# Patient Record
Sex: Female | Born: 1955 | Race: Black or African American | Hispanic: No | Smoking: Never smoker
Health system: Southern US, Community
[De-identification: ages and names within clinical notes are randomized; demographics above are authoritative.]

## PROBLEM LIST (undated history)

## (undated) ENCOUNTER — Emergency Department (HOSPITAL_COMMUNITY): Disposition: A | Discharge status: Home / Self Care | Payer: Medicare Other

## (undated) DIAGNOSIS — Z9981 Dependence on supplemental oxygen: Secondary | ICD-10-CM

## (undated) DIAGNOSIS — R0602 Shortness of breath: Secondary | ICD-10-CM

## (undated) DIAGNOSIS — F32A Depression, unspecified: Secondary | ICD-10-CM

## (undated) DIAGNOSIS — J449 Chronic obstructive pulmonary disease, unspecified: Secondary | ICD-10-CM

## (undated) DIAGNOSIS — E785 Hyperlipidemia, unspecified: Secondary | ICD-10-CM

## (undated) DIAGNOSIS — R569 Unspecified convulsions: Secondary | ICD-10-CM

## (undated) DIAGNOSIS — D689 Coagulation defect, unspecified: Secondary | ICD-10-CM

## (undated) DIAGNOSIS — R42 Dizziness and giddiness: Secondary | ICD-10-CM

## (undated) DIAGNOSIS — M1990 Unspecified osteoarthritis, unspecified site: Secondary | ICD-10-CM

## (undated) DIAGNOSIS — F329 Major depressive disorder, single episode, unspecified: Secondary | ICD-10-CM

## (undated) DIAGNOSIS — K219 Gastro-esophageal reflux disease without esophagitis: Secondary | ICD-10-CM

## (undated) DIAGNOSIS — Z87442 Personal history of urinary calculi: Secondary | ICD-10-CM

## (undated) DIAGNOSIS — G4730 Sleep apnea, unspecified: Secondary | ICD-10-CM

## (undated) DIAGNOSIS — E039 Hypothyroidism, unspecified: Secondary | ICD-10-CM

## (undated) DIAGNOSIS — Z9289 Personal history of other medical treatment: Secondary | ICD-10-CM

## (undated) DIAGNOSIS — M797 Fibromyalgia: Secondary | ICD-10-CM

## (undated) DIAGNOSIS — J45909 Unspecified asthma, uncomplicated: Secondary | ICD-10-CM

## (undated) DIAGNOSIS — I82409 Acute embolism and thrombosis of unspecified deep veins of unspecified lower extremity: Secondary | ICD-10-CM

## (undated) DIAGNOSIS — F1411 Cocaine abuse, in remission: Secondary | ICD-10-CM

## (undated) DIAGNOSIS — E278 Other specified disorders of adrenal gland: Secondary | ICD-10-CM

## (undated) DIAGNOSIS — R011 Cardiac murmur, unspecified: Secondary | ICD-10-CM

## (undated) DIAGNOSIS — I10 Essential (primary) hypertension: Secondary | ICD-10-CM

## (undated) HISTORY — DX: Chronic obstructive pulmonary disease, unspecified: J44.9

## (undated) HISTORY — DX: Unspecified osteoarthritis, unspecified site: M19.90

## (undated) HISTORY — PX: BACK SURGERY: SHX140

## (undated) HISTORY — DX: Cocaine abuse, in remission: F14.11

## (undated) HISTORY — DX: Essential (primary) hypertension: I10

## (undated) HISTORY — DX: Other specified disorders of adrenal gland: E27.8

## (undated) HISTORY — DX: Acute embolism and thrombosis of unspecified deep veins of unspecified lower extremity: I82.409

## (undated) HISTORY — DX: Hyperlipidemia, unspecified: E78.5

## (undated) HISTORY — DX: Coagulation defect, unspecified: D68.9

## (undated) HISTORY — PX: OTHER SURGICAL HISTORY: SHX169

## (undated) HISTORY — PX: CARPAL TUNNEL RELEASE: SHX101

## (undated) HISTORY — PX: CHOLECYSTECTOMY: SHX55

## (undated) HISTORY — PX: TUBAL LIGATION: SHX77

---

## 1987-03-21 HISTORY — PX: ABDOMINAL HYSTERECTOMY: SHX81

## 1997-07-04 ENCOUNTER — Ambulatory Visit (HOSPITAL_COMMUNITY): Admission: RE | Admit: 1997-07-04 | Discharge: 1997-07-04 | Payer: Self-pay | Admitting: Emergency Medicine

## 1997-08-22 ENCOUNTER — Emergency Department (HOSPITAL_COMMUNITY): Admission: EM | Admit: 1997-08-22 | Discharge: 1997-08-22 | Payer: Self-pay | Admitting: Emergency Medicine

## 1997-09-06 ENCOUNTER — Ambulatory Visit (HOSPITAL_COMMUNITY): Admission: RE | Admit: 1997-09-06 | Discharge: 1997-09-06 | Payer: Self-pay | Admitting: Neurosurgery

## 1997-09-11 ENCOUNTER — Inpatient Hospital Stay (HOSPITAL_COMMUNITY): Admission: RE | Admit: 1997-09-11 | Discharge: 1997-09-14 | Payer: Self-pay | Admitting: Neurosurgery

## 1997-09-25 ENCOUNTER — Ambulatory Visit (HOSPITAL_COMMUNITY): Admission: RE | Admit: 1997-09-25 | Discharge: 1997-09-25 | Payer: Self-pay | Admitting: Neurosurgery

## 1997-09-29 ENCOUNTER — Encounter: Admission: RE | Admit: 1997-09-29 | Discharge: 1997-12-28 | Payer: Self-pay | Admitting: Neurosurgery

## 1997-10-01 ENCOUNTER — Ambulatory Visit (HOSPITAL_COMMUNITY): Admission: RE | Admit: 1997-10-01 | Discharge: 1997-10-01 | Payer: Self-pay | Admitting: Neurosurgery

## 1998-04-15 ENCOUNTER — Ambulatory Visit (HOSPITAL_COMMUNITY): Admission: RE | Admit: 1998-04-15 | Discharge: 1998-04-15 | Payer: Self-pay | Admitting: Neurosurgery

## 1998-04-15 ENCOUNTER — Encounter: Payer: Self-pay | Admitting: Neurosurgery

## 1998-05-05 ENCOUNTER — Emergency Department (HOSPITAL_COMMUNITY): Admission: EM | Admit: 1998-05-05 | Discharge: 1998-05-05 | Payer: Self-pay | Admitting: Emergency Medicine

## 1998-06-30 ENCOUNTER — Emergency Department (HOSPITAL_COMMUNITY): Admission: EM | Admit: 1998-06-30 | Discharge: 1998-06-30 | Payer: Self-pay | Admitting: Emergency Medicine

## 1998-09-22 ENCOUNTER — Emergency Department (HOSPITAL_COMMUNITY): Admission: EM | Admit: 1998-09-22 | Discharge: 1998-09-22 | Payer: Self-pay | Admitting: Emergency Medicine

## 1998-11-10 ENCOUNTER — Emergency Department (HOSPITAL_COMMUNITY): Admission: EM | Admit: 1998-11-10 | Discharge: 1998-11-10 | Payer: Self-pay | Admitting: Emergency Medicine

## 1998-11-10 ENCOUNTER — Encounter: Payer: Self-pay | Admitting: Emergency Medicine

## 1999-02-03 ENCOUNTER — Emergency Department (HOSPITAL_COMMUNITY): Admission: EM | Admit: 1999-02-03 | Discharge: 1999-02-03 | Payer: Self-pay

## 1999-02-14 ENCOUNTER — Other Ambulatory Visit: Admission: RE | Admit: 1999-02-14 | Discharge: 1999-02-14 | Payer: Self-pay | Admitting: Orthopedic Surgery

## 1999-05-02 ENCOUNTER — Emergency Department (HOSPITAL_COMMUNITY): Admission: EM | Admit: 1999-05-02 | Discharge: 1999-05-02 | Payer: Self-pay

## 1999-05-25 ENCOUNTER — Emergency Department (HOSPITAL_COMMUNITY): Admission: EM | Admit: 1999-05-25 | Discharge: 1999-05-25 | Payer: Self-pay | Admitting: *Deleted

## 1999-11-04 ENCOUNTER — Emergency Department (HOSPITAL_COMMUNITY): Admission: EM | Admit: 1999-11-04 | Discharge: 1999-11-04 | Payer: Self-pay | Admitting: *Deleted

## 1999-11-05 ENCOUNTER — Emergency Department (HOSPITAL_COMMUNITY): Admission: EM | Admit: 1999-11-05 | Discharge: 1999-11-05 | Payer: Self-pay | Admitting: Emergency Medicine

## 2000-01-17 ENCOUNTER — Emergency Department (HOSPITAL_COMMUNITY): Admission: EM | Admit: 2000-01-17 | Discharge: 2000-01-17 | Payer: Self-pay | Admitting: Emergency Medicine

## 2000-01-18 ENCOUNTER — Emergency Department (HOSPITAL_COMMUNITY): Admission: EM | Admit: 2000-01-18 | Discharge: 2000-01-18 | Payer: Self-pay | Admitting: Emergency Medicine

## 2000-04-02 ENCOUNTER — Emergency Department (HOSPITAL_COMMUNITY): Admission: EM | Admit: 2000-04-02 | Discharge: 2000-04-02 | Payer: Self-pay | Admitting: Emergency Medicine

## 2000-07-08 ENCOUNTER — Emergency Department (HOSPITAL_COMMUNITY): Admission: EM | Admit: 2000-07-08 | Discharge: 2000-07-08 | Payer: Self-pay | Admitting: Internal Medicine

## 2000-08-12 ENCOUNTER — Emergency Department (HOSPITAL_COMMUNITY): Admission: EM | Admit: 2000-08-12 | Discharge: 2000-08-12 | Payer: Self-pay | Admitting: Emergency Medicine

## 2000-08-15 ENCOUNTER — Inpatient Hospital Stay (HOSPITAL_COMMUNITY): Admission: EM | Admit: 2000-08-15 | Discharge: 2000-08-16 | Payer: Self-pay | Admitting: Emergency Medicine

## 2000-08-15 ENCOUNTER — Encounter: Payer: Self-pay | Admitting: Emergency Medicine

## 2000-08-16 ENCOUNTER — Encounter: Payer: Self-pay | Admitting: Internal Medicine

## 2000-08-23 ENCOUNTER — Emergency Department (HOSPITAL_COMMUNITY): Admission: EM | Admit: 2000-08-23 | Discharge: 2000-08-23 | Payer: Self-pay | Admitting: Emergency Medicine

## 2000-08-23 ENCOUNTER — Encounter: Payer: Self-pay | Admitting: Emergency Medicine

## 2000-10-01 ENCOUNTER — Inpatient Hospital Stay (HOSPITAL_COMMUNITY): Admission: EM | Admit: 2000-10-01 | Discharge: 2000-10-03 | Payer: Self-pay | Admitting: Emergency Medicine

## 2000-10-02 ENCOUNTER — Encounter: Payer: Self-pay | Admitting: Endocrinology

## 2000-10-18 ENCOUNTER — Encounter: Payer: Self-pay | Admitting: Emergency Medicine

## 2000-10-18 ENCOUNTER — Emergency Department (HOSPITAL_COMMUNITY): Admission: EM | Admit: 2000-10-18 | Discharge: 2000-10-18 | Payer: Self-pay | Admitting: Emergency Medicine

## 2000-10-26 ENCOUNTER — Encounter: Payer: Self-pay | Admitting: Emergency Medicine

## 2000-10-26 ENCOUNTER — Emergency Department (HOSPITAL_COMMUNITY): Admission: EM | Admit: 2000-10-26 | Discharge: 2000-10-26 | Payer: Self-pay | Admitting: Emergency Medicine

## 2001-03-16 ENCOUNTER — Emergency Department (HOSPITAL_COMMUNITY): Admission: EM | Admit: 2001-03-16 | Discharge: 2001-03-16 | Payer: Self-pay

## 2004-07-02 ENCOUNTER — Emergency Department (HOSPITAL_COMMUNITY): Admission: EM | Admit: 2004-07-02 | Discharge: 2004-07-02 | Payer: Self-pay | Admitting: Emergency Medicine

## 2005-02-11 ENCOUNTER — Emergency Department (HOSPITAL_COMMUNITY): Admission: EM | Admit: 2005-02-11 | Discharge: 2005-02-12 | Payer: Self-pay | Admitting: Emergency Medicine

## 2005-02-14 ENCOUNTER — Emergency Department (HOSPITAL_COMMUNITY): Admission: EM | Admit: 2005-02-14 | Discharge: 2005-02-14 | Payer: Self-pay | Admitting: Emergency Medicine

## 2005-02-22 ENCOUNTER — Ambulatory Visit: Payer: Self-pay | Admitting: Internal Medicine

## 2005-02-24 ENCOUNTER — Ambulatory Visit: Payer: Self-pay | Admitting: Internal Medicine

## 2005-03-17 ENCOUNTER — Ambulatory Visit (HOSPITAL_COMMUNITY): Admission: RE | Admit: 2005-03-17 | Discharge: 2005-03-17 | Payer: Self-pay | Admitting: Internal Medicine

## 2005-03-21 ENCOUNTER — Encounter (INDEPENDENT_AMBULATORY_CARE_PROVIDER_SITE_OTHER): Payer: Self-pay | Admitting: Specialist

## 2005-03-21 ENCOUNTER — Ambulatory Visit (HOSPITAL_COMMUNITY): Admission: RE | Admit: 2005-03-21 | Discharge: 2005-03-21 | Payer: Self-pay | Admitting: Internal Medicine

## 2005-03-21 ENCOUNTER — Emergency Department (HOSPITAL_COMMUNITY): Admission: EM | Admit: 2005-03-21 | Discharge: 2005-03-22 | Payer: Self-pay | Admitting: Emergency Medicine

## 2005-03-31 ENCOUNTER — Ambulatory Visit: Payer: Self-pay | Admitting: Hospitalist

## 2005-09-12 ENCOUNTER — Emergency Department (HOSPITAL_COMMUNITY): Admission: EM | Admit: 2005-09-12 | Discharge: 2005-09-12 | Payer: Self-pay | Admitting: Emergency Medicine

## 2005-10-20 ENCOUNTER — Emergency Department (HOSPITAL_COMMUNITY): Admission: EM | Admit: 2005-10-20 | Discharge: 2005-10-20 | Payer: Self-pay | Admitting: Emergency Medicine

## 2005-10-21 ENCOUNTER — Emergency Department (HOSPITAL_COMMUNITY): Admission: EM | Admit: 2005-10-21 | Discharge: 2005-10-21 | Payer: Self-pay | Admitting: Emergency Medicine

## 2005-10-24 ENCOUNTER — Ambulatory Visit: Payer: Self-pay | Admitting: Cardiology

## 2005-11-27 ENCOUNTER — Ambulatory Visit (HOSPITAL_COMMUNITY): Admission: RE | Admit: 2005-11-27 | Discharge: 2005-11-27 | Payer: Self-pay | Admitting: Cardiology

## 2005-11-29 ENCOUNTER — Emergency Department (HOSPITAL_COMMUNITY): Admission: EM | Admit: 2005-11-29 | Discharge: 2005-11-29 | Payer: Self-pay | Admitting: Emergency Medicine

## 2005-12-27 ENCOUNTER — Ambulatory Visit: Payer: Self-pay | Admitting: Cardiology

## 2005-12-28 ENCOUNTER — Emergency Department (HOSPITAL_COMMUNITY): Admission: EM | Admit: 2005-12-28 | Discharge: 2005-12-28 | Payer: Self-pay | Admitting: Emergency Medicine

## 2006-01-02 ENCOUNTER — Inpatient Hospital Stay (HOSPITAL_COMMUNITY): Admission: EM | Admit: 2006-01-02 | Discharge: 2006-01-05 | Payer: Self-pay | Admitting: Emergency Medicine

## 2006-01-02 ENCOUNTER — Ambulatory Visit: Payer: Self-pay | Admitting: Cardiology

## 2006-01-02 ENCOUNTER — Encounter: Payer: Self-pay | Admitting: Cardiology

## 2006-01-08 ENCOUNTER — Ambulatory Visit: Payer: Self-pay | Admitting: Family Medicine

## 2006-01-09 ENCOUNTER — Ambulatory Visit (HOSPITAL_COMMUNITY): Admission: RE | Admit: 2006-01-09 | Discharge: 2006-01-09 | Payer: Self-pay | Admitting: *Deleted

## 2006-01-09 ENCOUNTER — Encounter (INDEPENDENT_AMBULATORY_CARE_PROVIDER_SITE_OTHER): Payer: Self-pay | Admitting: *Deleted

## 2006-01-09 ENCOUNTER — Ambulatory Visit: Payer: Self-pay | Admitting: *Deleted

## 2006-01-29 ENCOUNTER — Ambulatory Visit: Payer: Self-pay | Admitting: Nurse Practitioner

## 2006-03-08 ENCOUNTER — Ambulatory Visit: Payer: Self-pay | Admitting: Family Medicine

## 2006-03-09 ENCOUNTER — Emergency Department (HOSPITAL_COMMUNITY): Admission: EM | Admit: 2006-03-09 | Discharge: 2006-03-09 | Payer: Self-pay | Admitting: Emergency Medicine

## 2006-03-14 ENCOUNTER — Ambulatory Visit: Payer: Self-pay | Admitting: Family Medicine

## 2006-04-03 ENCOUNTER — Ambulatory Visit: Payer: Self-pay | Admitting: Family Medicine

## 2006-04-12 ENCOUNTER — Ambulatory Visit: Payer: Self-pay | Admitting: Family Medicine

## 2006-05-01 ENCOUNTER — Ambulatory Visit: Payer: Self-pay | Admitting: Internal Medicine

## 2006-05-01 ENCOUNTER — Ambulatory Visit: Payer: Self-pay | Admitting: *Deleted

## 2006-05-01 ENCOUNTER — Inpatient Hospital Stay (HOSPITAL_COMMUNITY): Admission: EM | Admit: 2006-05-01 | Discharge: 2006-05-03 | Payer: Self-pay | Admitting: Emergency Medicine

## 2006-05-01 ENCOUNTER — Encounter (INDEPENDENT_AMBULATORY_CARE_PROVIDER_SITE_OTHER): Payer: Self-pay | Admitting: Cardiology

## 2006-05-04 ENCOUNTER — Ambulatory Visit: Payer: Self-pay | Admitting: *Deleted

## 2006-05-04 ENCOUNTER — Emergency Department (HOSPITAL_COMMUNITY): Admission: EM | Admit: 2006-05-04 | Discharge: 2006-05-05 | Payer: Self-pay | Admitting: Emergency Medicine

## 2006-05-07 ENCOUNTER — Ambulatory Visit: Payer: Self-pay | Admitting: Family Medicine

## 2006-05-08 ENCOUNTER — Ambulatory Visit: Payer: Self-pay | Admitting: Internal Medicine

## 2006-05-09 ENCOUNTER — Ambulatory Visit: Payer: Self-pay | Admitting: Family Medicine

## 2006-05-14 ENCOUNTER — Ambulatory Visit: Payer: Self-pay | Admitting: Family Medicine

## 2006-05-17 ENCOUNTER — Ambulatory Visit: Payer: Self-pay | Admitting: Family Medicine

## 2006-05-24 ENCOUNTER — Ambulatory Visit: Payer: Self-pay | Admitting: Family Medicine

## 2006-06-04 ENCOUNTER — Ambulatory Visit: Payer: Self-pay | Admitting: Family Medicine

## 2006-06-05 ENCOUNTER — Ambulatory Visit: Payer: Self-pay | Admitting: Family Medicine

## 2006-06-10 ENCOUNTER — Emergency Department (HOSPITAL_COMMUNITY): Admission: EM | Admit: 2006-06-10 | Discharge: 2006-06-10 | Payer: Self-pay | Admitting: *Deleted

## 2006-06-12 ENCOUNTER — Ambulatory Visit: Payer: Self-pay | Admitting: Family Medicine

## 2006-06-13 ENCOUNTER — Inpatient Hospital Stay (HOSPITAL_COMMUNITY): Admission: EM | Admit: 2006-06-13 | Discharge: 2006-06-15 | Payer: Self-pay | Admitting: Emergency Medicine

## 2006-06-26 ENCOUNTER — Ambulatory Visit: Payer: Self-pay | Admitting: Family Medicine

## 2006-07-02 ENCOUNTER — Ambulatory Visit: Payer: Self-pay | Admitting: Family Medicine

## 2006-07-03 ENCOUNTER — Ambulatory Visit: Payer: Self-pay | Admitting: Family Medicine

## 2006-07-11 ENCOUNTER — Ambulatory Visit: Payer: Self-pay | Admitting: Family Medicine

## 2006-07-18 ENCOUNTER — Ambulatory Visit: Payer: Self-pay | Admitting: Family Medicine

## 2006-07-25 ENCOUNTER — Ambulatory Visit: Payer: Self-pay | Admitting: Family Medicine

## 2006-07-30 ENCOUNTER — Emergency Department (HOSPITAL_COMMUNITY): Admission: EM | Admit: 2006-07-30 | Discharge: 2006-07-30 | Payer: Self-pay | Admitting: Emergency Medicine

## 2006-08-06 ENCOUNTER — Ambulatory Visit: Payer: Self-pay | Admitting: Family Medicine

## 2006-08-16 ENCOUNTER — Ambulatory Visit: Payer: Self-pay | Admitting: Family Medicine

## 2006-09-18 ENCOUNTER — Ambulatory Visit: Payer: Self-pay | Admitting: Internal Medicine

## 2006-09-27 ENCOUNTER — Emergency Department (HOSPITAL_COMMUNITY): Admission: EM | Admit: 2006-09-27 | Discharge: 2006-09-27 | Payer: Self-pay | Admitting: Emergency Medicine

## 2006-10-18 ENCOUNTER — Ambulatory Visit: Payer: Self-pay | Admitting: Internal Medicine

## 2006-10-19 ENCOUNTER — Ambulatory Visit (HOSPITAL_COMMUNITY): Admission: RE | Admit: 2006-10-19 | Discharge: 2006-10-19 | Payer: Self-pay | Admitting: Internal Medicine

## 2006-10-30 ENCOUNTER — Ambulatory Visit: Payer: Self-pay | Admitting: Internal Medicine

## 2006-12-04 ENCOUNTER — Ambulatory Visit: Payer: Self-pay | Admitting: Internal Medicine

## 2006-12-21 ENCOUNTER — Encounter: Admission: RE | Admit: 2006-12-21 | Discharge: 2007-02-12 | Payer: Self-pay | Admitting: Family Medicine

## 2006-12-24 IMAGING — CT CT ABDOMEN W/O CM
1 of 2 series · 15 of 32 positions shown, 19 images · non-contrast
Comparison: none

CLINICAL DATA: Left-sided abdominal pain

ABDOMEN CT WITHOUT CONTRAST - URINARY STONE PROTOCOL
TECHNIQUE: Multidetector CT imaging of the abdomen was performed following the
urinary stone protocol.  No oral or intravenous contrast was administered.
TECHNIQUE: Multidetector CT imaging of the pelvis was performed following the

[Series 2: routine abdomen · axial · 0.83mm/px · z∈[-448,-44]mm · 15 of 89 slices shown, 19 images]
[im 4/89  soft-tissue]
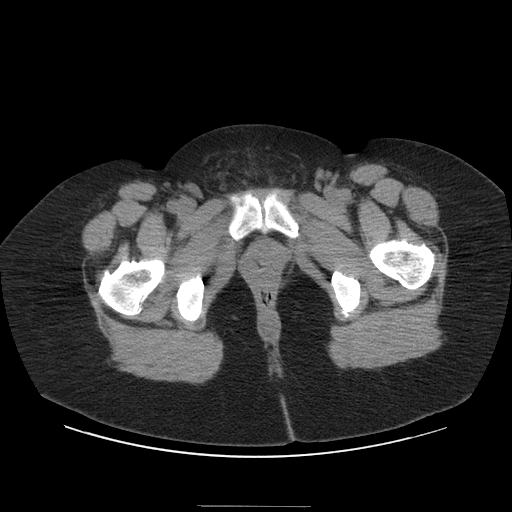
[im 4/89  bone]
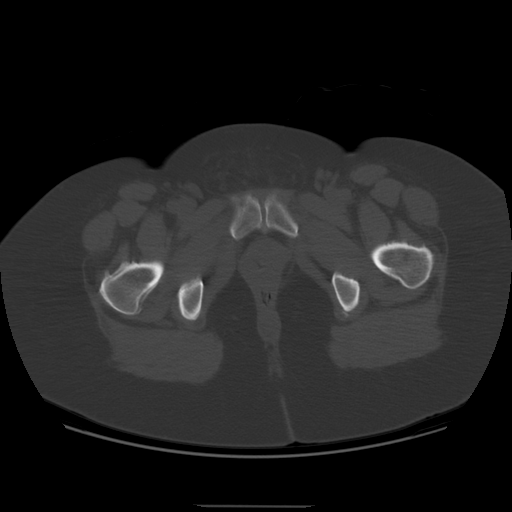
[im 12/89  soft-tissue]
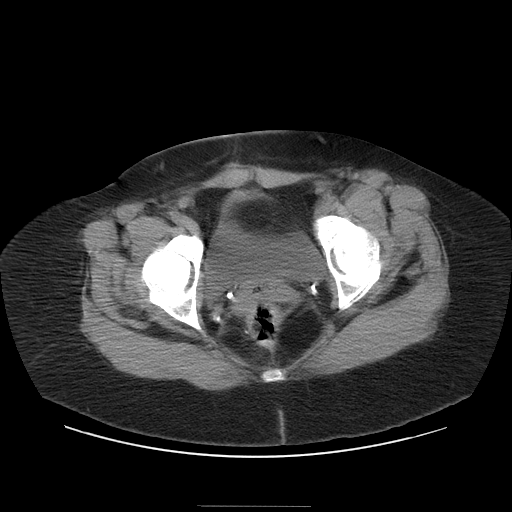
[im 19/89  soft-tissue]
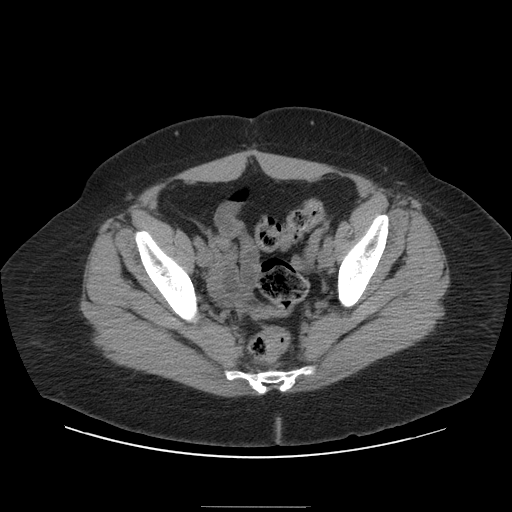
[im 26/89  soft-tissue]
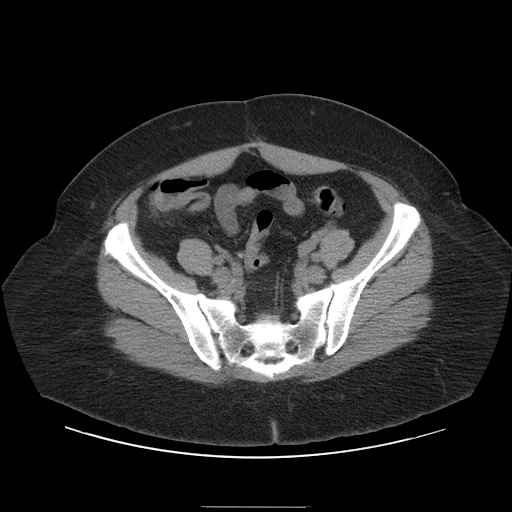
[im 30/89  soft-tissue]
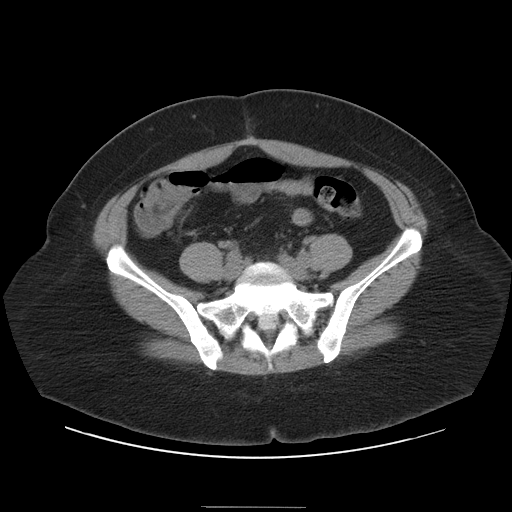
[im 37/89  soft-tissue]
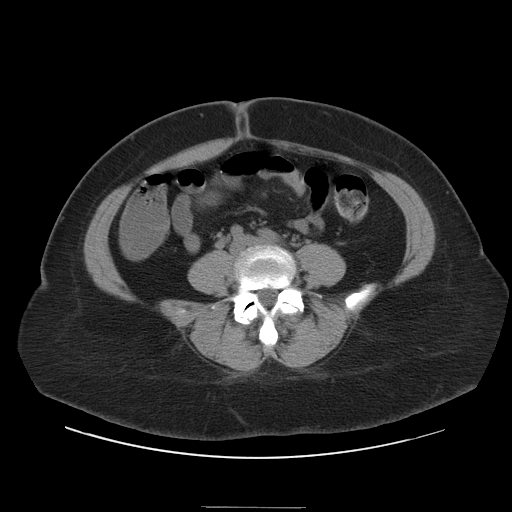
[im 45/89  soft-tissue]
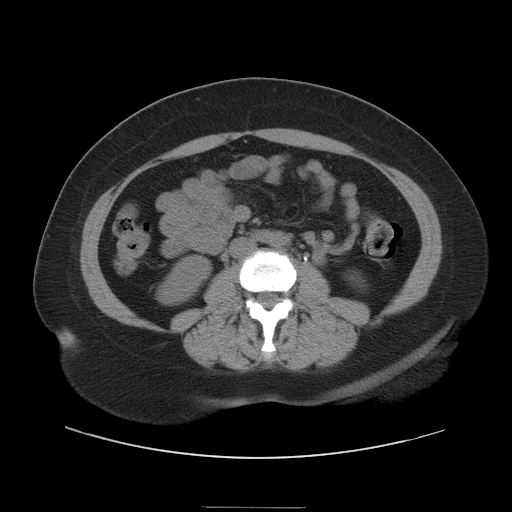
[im 52/89  soft-tissue]
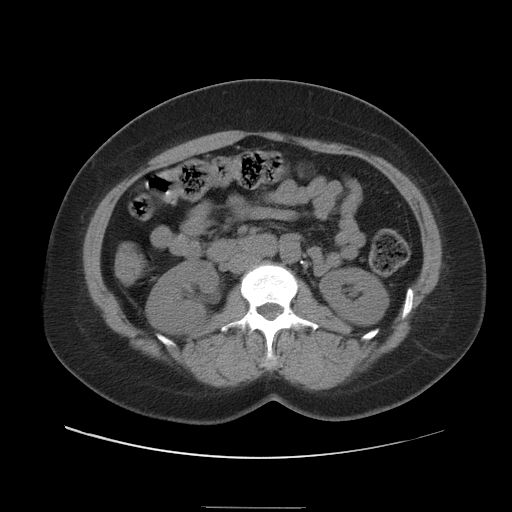
[im 59/89  soft-tissue]
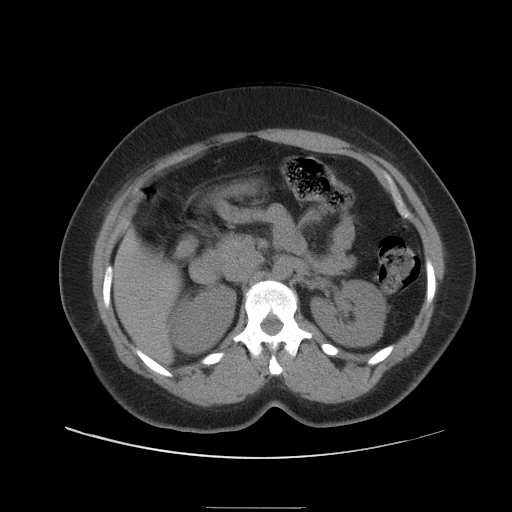
[im 59/89  bone]
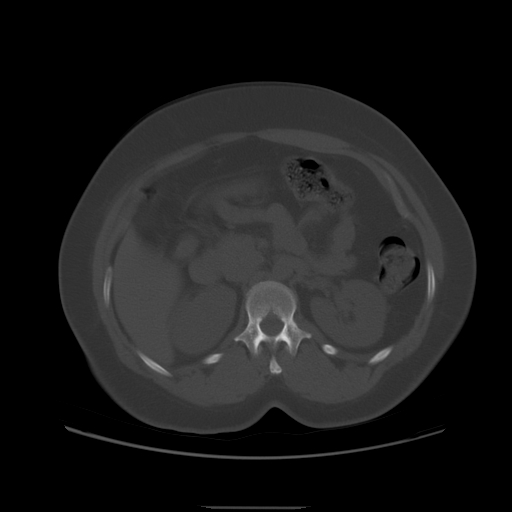
[im 63/89  soft-tissue]
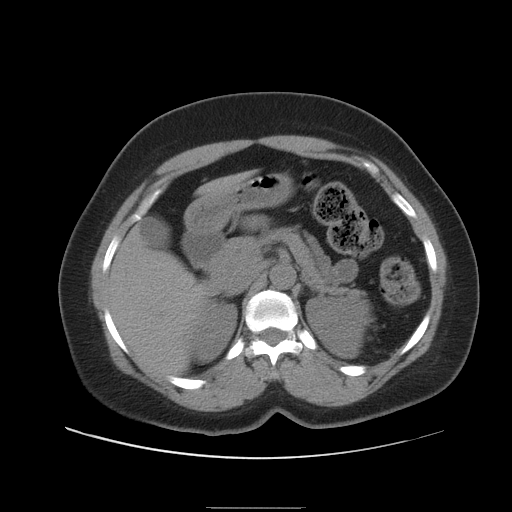
[im 70/89  soft-tissue]
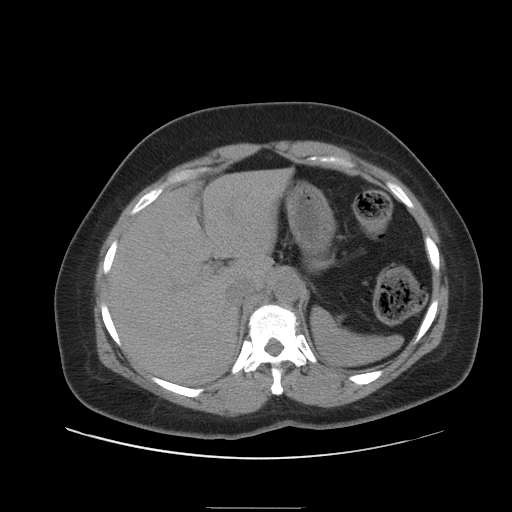
[im 74/89  lung]
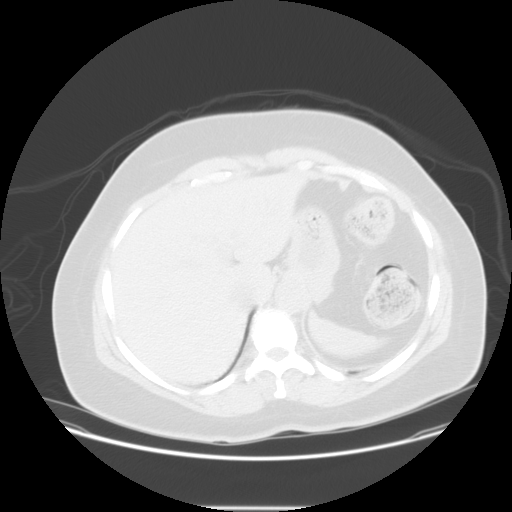
[im 78/89  soft-tissue]
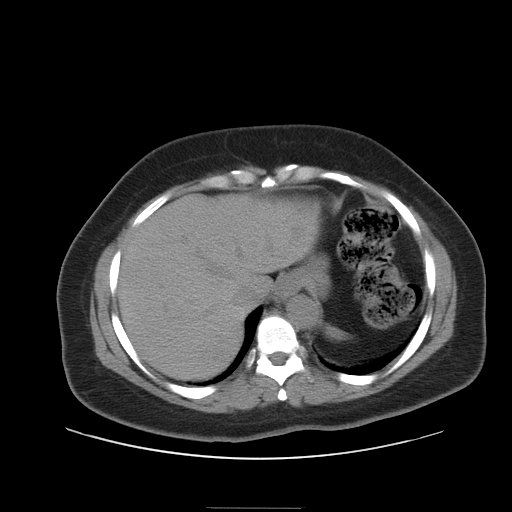
[im 78/89  lung]
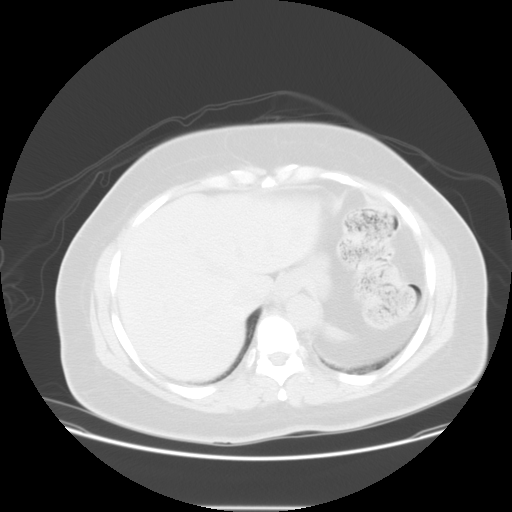
[im 81/89  lung]
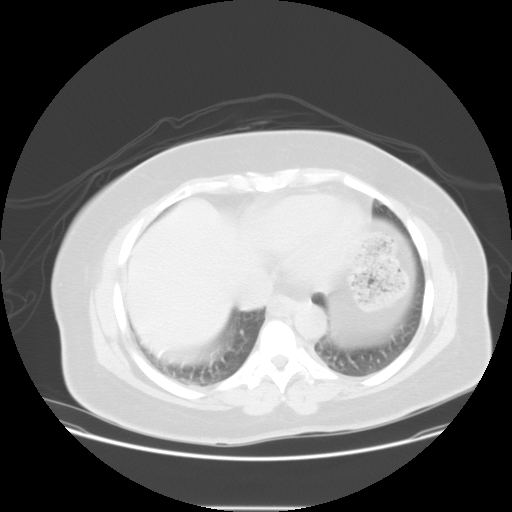
[im 85/89  soft-tissue]
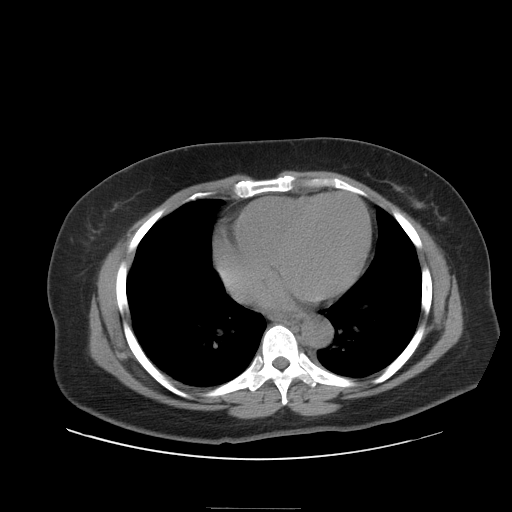
[im 85/89  lung]
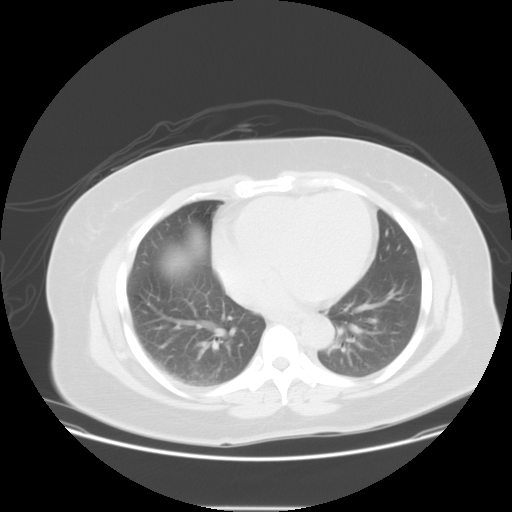

[15 of 32 positions shown; findings below may reference images not displayed]

FINDINGS: There is no evidence of hydronephrosis. No renal or proximal ureteral
calculi visualized. The liver, spleen, pancreas, left adrenal, and kidneys have
a normal uninfused appearance. Small nodule noted within the right adrenal gland
which measures 25 Hounsfield units on this uninfused scan and therefore is not a
typical adenoma. This nodule measures 1.6 cm in greatest diameter on image 23.
Gallbladder and bowel grossly unremarkable.

Heart is mildly enlarged. Lung bases are clear.

IMPRESSION

No evidence of hydronephrosis or stone.

1.6 cm nodule in the right adrenal gland. This measures 25 Hounsfield units on
this uninfused scan and therefore is not diagnostic of an adenoma. This could be
further evaluated with MRI.

PELVIS CT WITHOUT CONTRAST - URINARY STONE PROTOCOL
FINDINGS: There are several small calcifications within the left side of the
pelvis. The majority these can be separated from the left ureter. A few however
are difficult to separate from the left ureter. However, given the left ureter's
normal caliber, I do not suspect these are ureteral calculi. Adnexa
unremarkable. No free fluid, free air, or adenopathy. There is a moderate amount
of stool throughout the colon. No evidence of diverticulosis or diverticulitis.

IMPRESSION

Left ureter is decompressed and difficult to follow. There are several small
calcifications in the left side of the pelvis which I suspect are vascular given
the left ureter is normal size. 

No acute findings.

## 2007-01-07 ENCOUNTER — Ambulatory Visit: Payer: Self-pay | Admitting: Family Medicine

## 2007-01-07 ENCOUNTER — Encounter (INDEPENDENT_AMBULATORY_CARE_PROVIDER_SITE_OTHER): Payer: Self-pay | Admitting: Internal Medicine

## 2007-01-07 LAB — CONVERTED CEMR LAB
ALT: 30 units/L (ref 0–35)
AST: 23 units/L (ref 0–37)
Albumin: 4.5 g/dL (ref 3.5–5.2)
Alkaline Phosphatase: 98 units/L (ref 39–117)
BUN: 15 mg/dL (ref 6–23)
CO2: 26 meq/L (ref 19–32)
Calcium: 9.4 mg/dL (ref 8.4–10.5)
Chloride: 103 meq/L (ref 96–112)
Cholesterol: 196 mg/dL (ref 0–200)
Creatinine, Ser: 0.67 mg/dL (ref 0.40–1.20)
Glucose, Bld: 92 mg/dL (ref 70–99)
HDL: 69 mg/dL (ref >39)
LDL Cholesterol: 104 mg/dL — ABNORMAL HIGH (ref 0–99)
Potassium: 3.7 meq/L (ref 3.5–5.3)
Sodium: 144 meq/L (ref 135–145)
Total Bilirubin: 0.4 mg/dL (ref 0.3–1.2)
Total CHOL/HDL Ratio: 2.8
Total Protein: 7.9 g/dL (ref 6.0–8.3)
Triglycerides: 116 mg/dL (ref <150)
VLDL: 23 mg/dL (ref 0–40)

## 2007-01-24 ENCOUNTER — Emergency Department (HOSPITAL_COMMUNITY): Admission: EM | Admit: 2007-01-24 | Discharge: 2007-01-25 | Payer: Self-pay | Admitting: Emergency Medicine

## 2007-01-28 ENCOUNTER — Ambulatory Visit: Payer: Self-pay | Admitting: Internal Medicine

## 2007-02-11 ENCOUNTER — Ambulatory Visit: Payer: Self-pay | Admitting: Internal Medicine

## 2007-04-25 ENCOUNTER — Ambulatory Visit: Payer: Self-pay | Admitting: Internal Medicine

## 2007-04-26 ENCOUNTER — Encounter: Payer: Self-pay | Admitting: Internal Medicine

## 2007-04-26 LAB — CONVERTED CEMR LAB
BUN: 13 mg/dL (ref 6–23)
CO2: 28 meq/L (ref 19–32)
Calcium: 9.5 mg/dL (ref 8.4–10.5)
Chloride: 104 meq/L (ref 96–112)
Cholesterol: 163 mg/dL (ref 0–200)
Creatinine, Ser: 0.86 mg/dL (ref 0.40–1.20)
Glucose, Bld: 97 mg/dL (ref 70–99)
HDL: 61 mg/dL (ref >39)
LDL Cholesterol: 61 mg/dL (ref 0–99)
Potassium: 3.6 meq/L (ref 3.5–5.3)
Sodium: 143 meq/L (ref 135–145)
Total CHOL/HDL Ratio: 2.7
Triglycerides: 205 mg/dL — ABNORMAL HIGH (ref <150)
VLDL: 41 mg/dL — ABNORMAL HIGH (ref 0–40)

## 2007-05-02 ENCOUNTER — Emergency Department (HOSPITAL_COMMUNITY): Admission: EM | Admit: 2007-05-02 | Discharge: 2007-05-02 | Payer: Self-pay | Admitting: Emergency Medicine

## 2007-05-07 ENCOUNTER — Encounter: Admission: RE | Admit: 2007-05-07 | Discharge: 2007-06-18 | Payer: Self-pay | Admitting: Orthopedic Surgery

## 2007-06-18 ENCOUNTER — Ambulatory Visit: Payer: Self-pay | Admitting: Oncology

## 2007-07-19 LAB — CBC WITH DIFFERENTIAL/PLATELET
BASO%: 0.5 % (ref 0.0–2.0)
Basophils Absolute: 0 10*3/uL (ref 0.0–0.1)
EOS%: 2.5 % (ref 0.0–7.0)
Eosinophils Absolute: 0.1 10*3/uL (ref 0.0–0.5)
HCT: 36.3 % (ref 34.8–46.6)
HGB: 12.3 g/dL (ref 11.6–15.9)
LYMPH%: 30.3 % (ref 14.0–48.0)
MCH: 28.4 pg (ref 26.0–34.0)
MCHC: 33.8 g/dL (ref 32.0–36.0)
MCV: 84 fL (ref 81.0–101.0)
MONO#: 0.5 10*3/uL (ref 0.1–0.9)
MONO%: 9.7 % (ref 0.0–13.0)
NEUT#: 2.9 10*3/uL (ref 1.5–6.5)
NEUT%: 57 % (ref 39.6–76.8)
Platelets: 331 10*3/uL (ref 145–400)
RBC: 4.33 10*6/uL (ref 3.70–5.32)
RDW: 17.2 % — ABNORMAL HIGH (ref 11.3–14.5)
WBC: 5 10*3/uL (ref 3.9–10.0)
lymph#: 1.5 10*3/uL (ref 0.9–3.3)

## 2007-07-25 LAB — ANTITHROMBIN III: AntiThromb III Func: 116 % (ref 76–126)

## 2007-07-25 LAB — LUPUS ANTICOAGULANT PANEL
DRVVT 1:1 Mix: 42.1 secs (ref 36.1–47.0)
DRVVT: 50.1 secs — ABNORMAL HIGH (ref 36.1–47.0)
PTT Lupus Anticoagulant: 49.6 secs — ABNORMAL HIGH (ref 36.3–48.8)
PTTLA 4:1 Mix: 46.1 secs (ref 36.3–48.8)

## 2007-07-25 LAB — BETA-2 GLYCOPROTEIN ANTIBODIES
Beta-2 Glyco I IgG: <4 U/mL (ref <20)
Beta-2-Glycoprotein I IgA: 30 U/mL (ref <10)
Beta-2-Glycoprotein I IgM: 5 U/mL (ref <10)

## 2007-07-25 LAB — PROTHROMBIN GENE MUTATION

## 2007-07-25 LAB — FACTOR 5 LEIDEN

## 2007-08-06 IMAGING — CT CT ABDOMEN W/ CM
2 of 5 series · 17 of 46 positions shown, 19 images · IV contrast (APPLIED)
Comparison: Noncontrast study dated 07/02/04.

CLINICAL DATA: Abdominal and pelvic pain.  
ABDOMEN CT WITH CONTRAST:
TECHNIQUE: Multidetector CT imaging of the abdomen was performed following the standard protocol during bolus administration of intravenous contrast.
Contrast:  125 cc Omnipaque 300 and oral contrast.
TECHNIQUE: Multidetector CT imaging of the pelvis was performed following the standard protocol during bolus administration of intravenous contrast.

[Series 2: abd_pel 5.0 b40f st · axial · 0.62mm/px · z∈[-438,-42]mm · 14 of 89 slices shown, 16 images]
[im 5/89  soft-tissue]
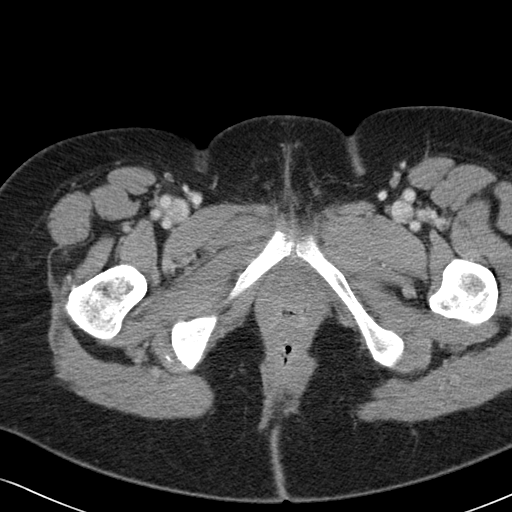
[im 5/89  bone]
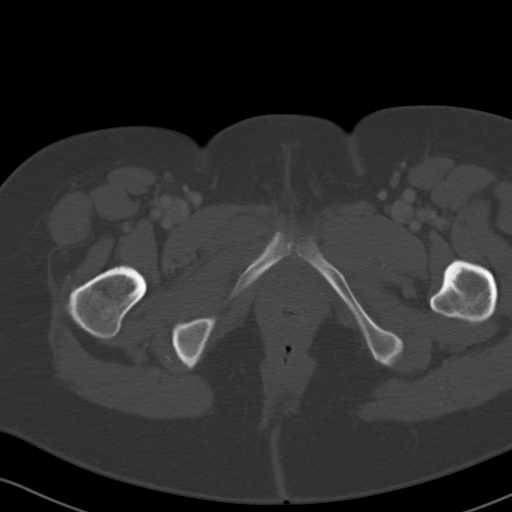
[im 14/89  soft-tissue]
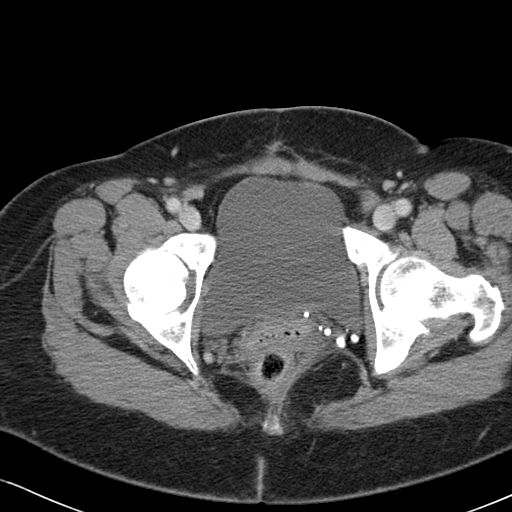
[im 18/89  soft-tissue]
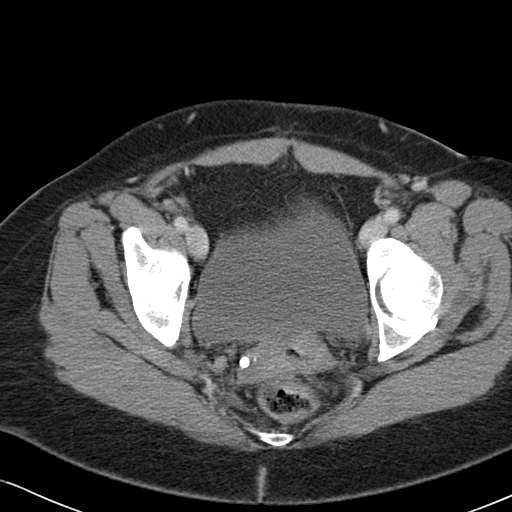
[im 23/89  soft-tissue]
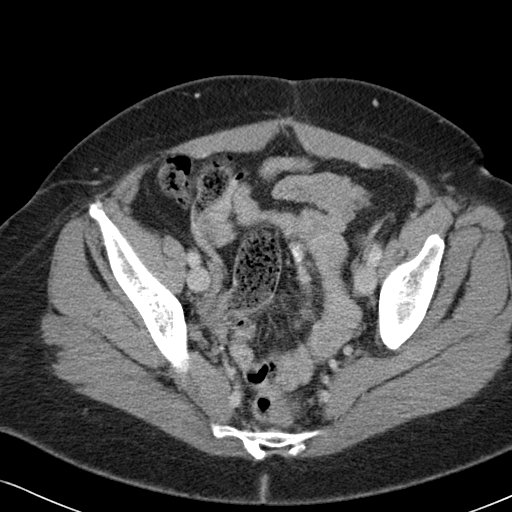
[im 31/89  soft-tissue]
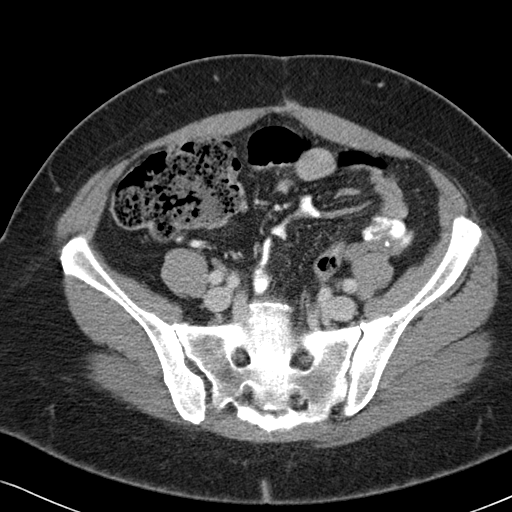
[im 36/89  soft-tissue]
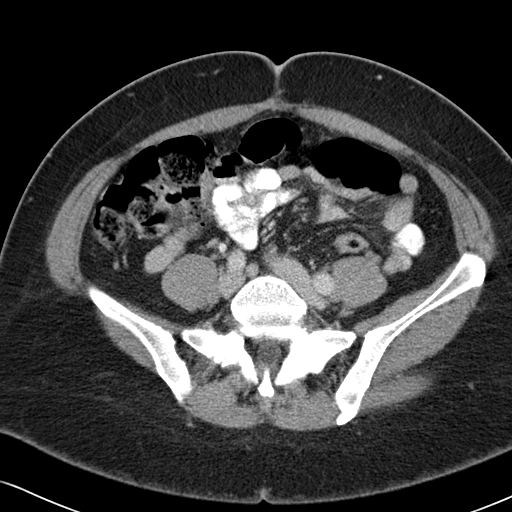
[im 40/89  soft-tissue]
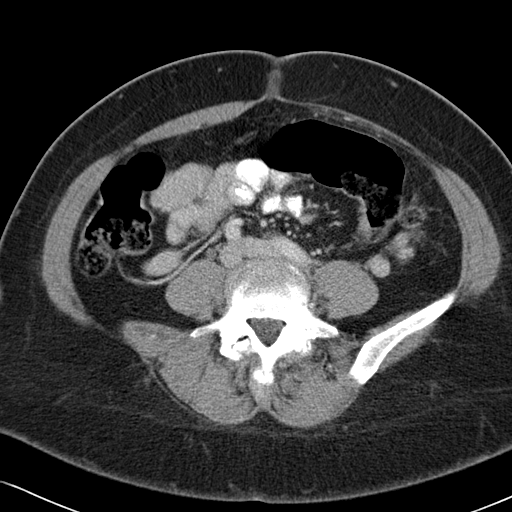
[im 49/89  soft-tissue]
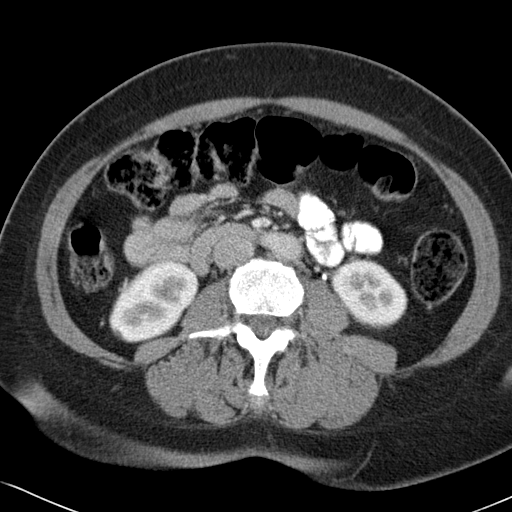
[im 53/89  soft-tissue]
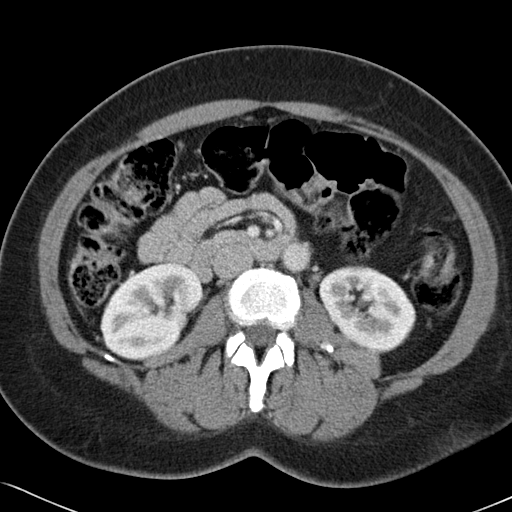
[im 53/89  bone]
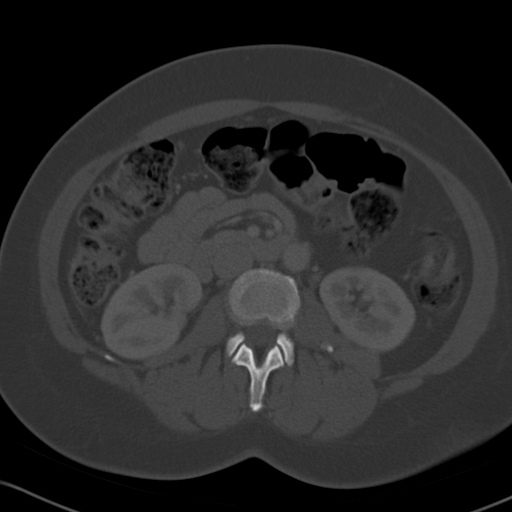
[im 58/89  soft-tissue]
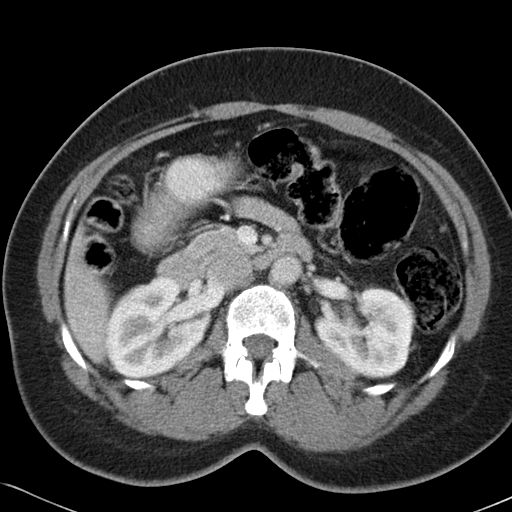
[im 67/89  soft-tissue]
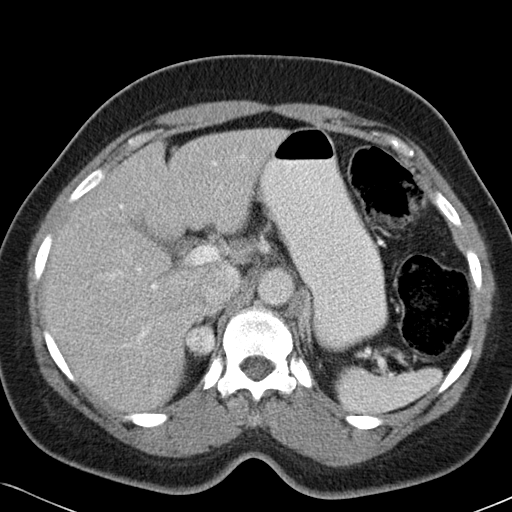
[im 71/89  soft-tissue]
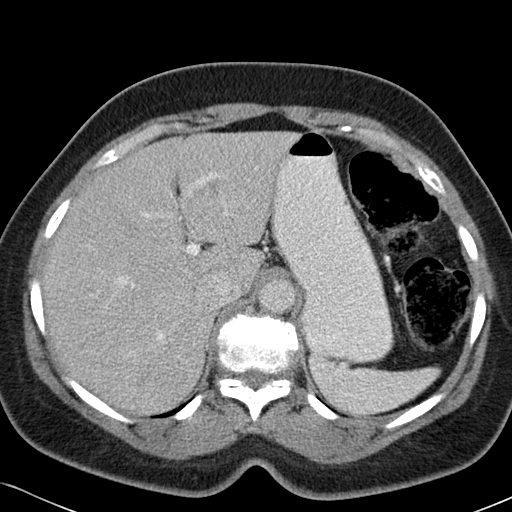
[im 75/89  soft-tissue]
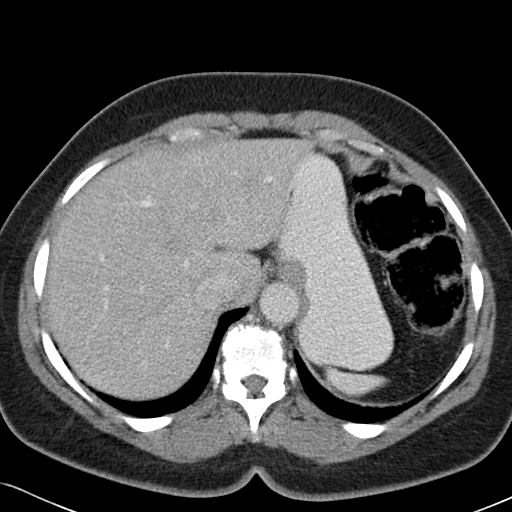
[im 84/89  soft-tissue]
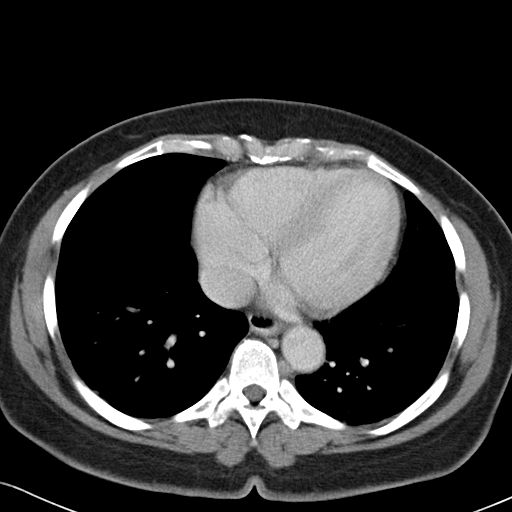

[Series 602: coronal abdomen · coronal · 0.90mm/px · 3 of 309 slices shown]
[im 103/309  soft-tissue]
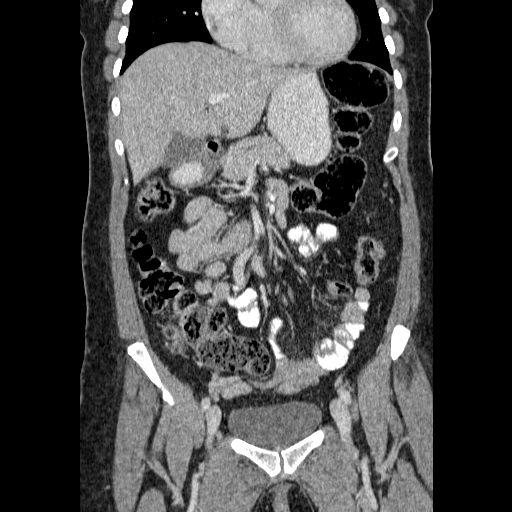
[im 137/309  soft-tissue]
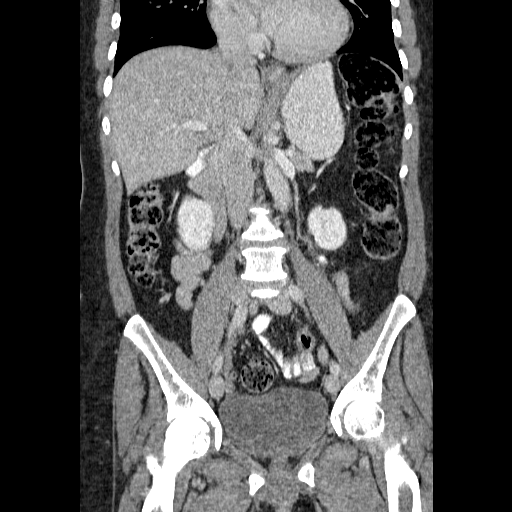
[im 172/309  soft-tissue]
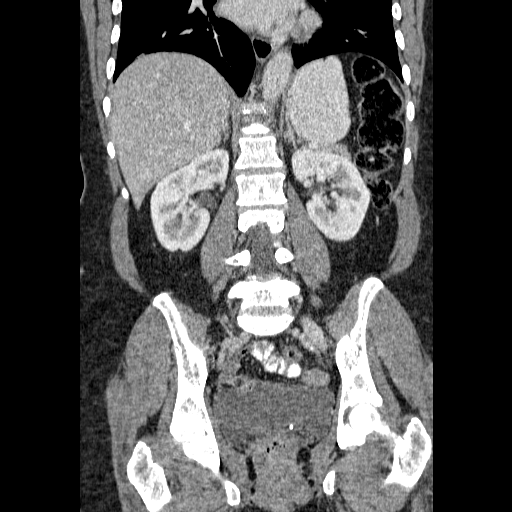

[17 of 46 positions shown; findings below may reference images not displayed]

FINDINGS: Cardiomegaly is noted.  Low density lesions within the left lateral hepatic segment are stable, likely represent small cysts or hemangiomas.  The remainder of the liver is unremarkable.   The gallbladder, spleen, pancreas, kidneys, left adrenal gland are unremarkable.  A 2.0 cm right adrenal mass enhances intensely and enlarged since the last study previously measuring 1.5 cm.  No evidence of enlarged lymph nodes, free fluid, abdominal aortic aneurysm, or biliary dilatation.  The visualized bowel is within normal limits.
IMPRESSION: 1.  Enlarging 2.0 cm right adrenal lesion, suspect pheochromocytoma versus neoplasm/metastasis.  Recommend clinical and laboratory evaluation and/or MRI.  
2.  No acute abnormality identified. 
PELVIS CT WITH CONTRAST:
FINDINGS: Patient is status post hysterectomy.  The bladder and bowel are unremarkable except for moderate colonic stool.  No evidence of free fluid or enlarged lymph nodes.  Degenerative changes in the lumbar spine are stable.
IMPRESSION: No acute abnormality.

## 2007-08-20 ENCOUNTER — Encounter: Admission: RE | Admit: 2007-08-20 | Discharge: 2007-08-20 | Payer: Self-pay | Admitting: Internal Medicine

## 2007-08-28 ENCOUNTER — Encounter: Admission: RE | Admit: 2007-08-28 | Discharge: 2007-08-28 | Payer: Self-pay | Admitting: Internal Medicine

## 2007-09-12 IMAGING — CR DG CHEST 1V PORT
1 series · 1 of 1 positions shown · non-contrast
Comparison: none

CLINICAL DATA: Chest wall pain.  Left arm pain.  History of asthma and bronchitis.
 PORTABLE CHEST - SINGLE VIEW - 03/21/05:

[view not recorded]
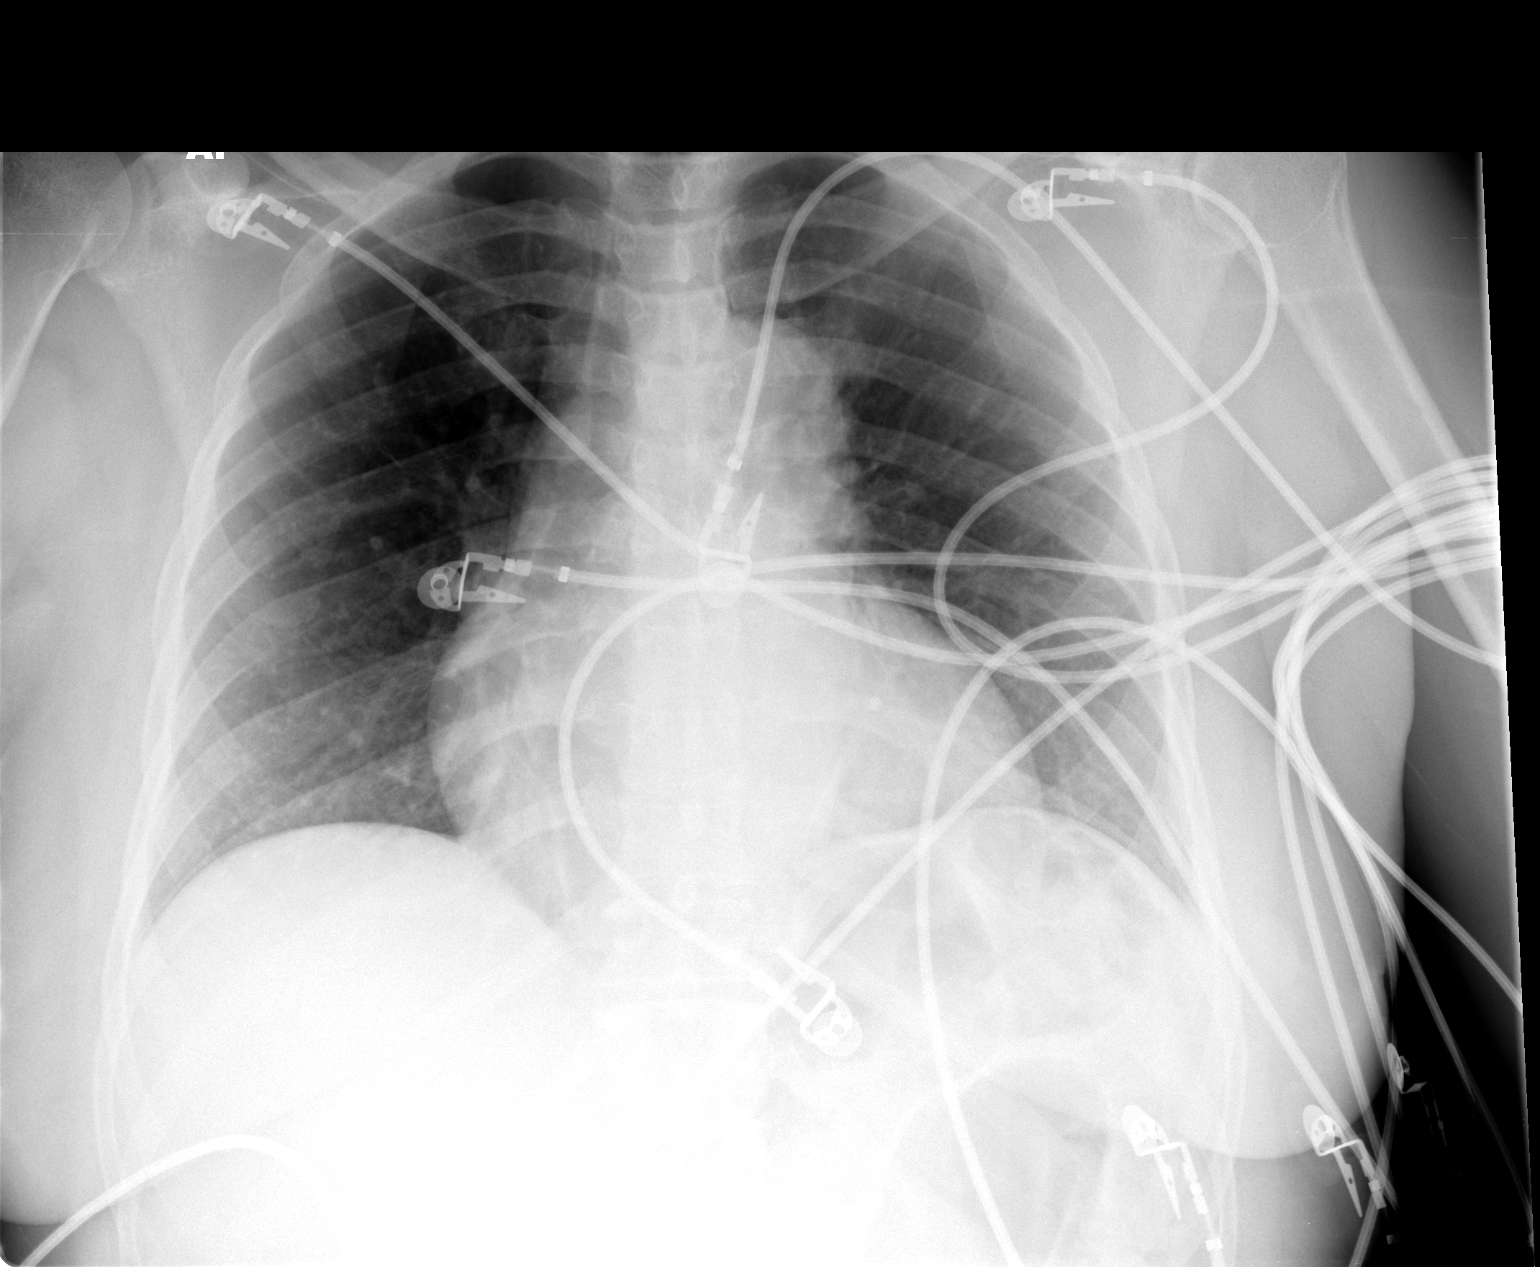

[1 of 1 positions shown; findings below may reference images not displayed]

FINDINGS: An AP semierect portable film of the chest made on 03/21/05 at 2545 hours is compared to a previous report of 10/26/00 and shows mild diffuse peribronchial thickening.  There is no evidence of active infiltrate, cardiomegaly, or edema.  There has been previous anterior fusion in the lower cervical spine.
IMPRESSION: No evidence of active cardiopulmonary disease.

## 2007-09-12 IMAGING — CT CT BIOPSY
1 of 2 series · 16 of 32 positions shown, 20 images · non-contrast
Comparison: none

CLINICAL DATA: Right adrenal nodule. Urine metabolic workup for pheochromocytoma was negative.  
 CT GUIDED BIOPSY OF A RIGHT ADRENAL NODULE:
 Procedure:  In the right decubitus position the right back was prepped and draped in a sterile fashion.  Lidocaine was utilized for local anesthesia.  Under CT guidance, a 19-gauge guide needle was inserted into the right adrenal nodule via paramedian approach.  Care was taken to avoid traversing pulmonary tissue.  Three 22-gauge fine-needle aspirates were obtained.  Subsequently four 20-gauge core biopsies were obtained.  The needle was removed.  Repeat imaging demonstrates no complication.

[Series 2: abd pelvis · axial · 0.94mm/px · z∈[-154,-100]mm · 16 of 128 slices shown, 20 images]
[im 9/128  soft-tissue]
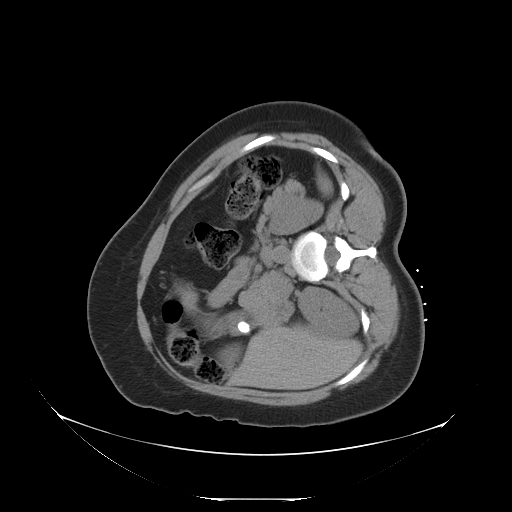
[im 9/128  bone]
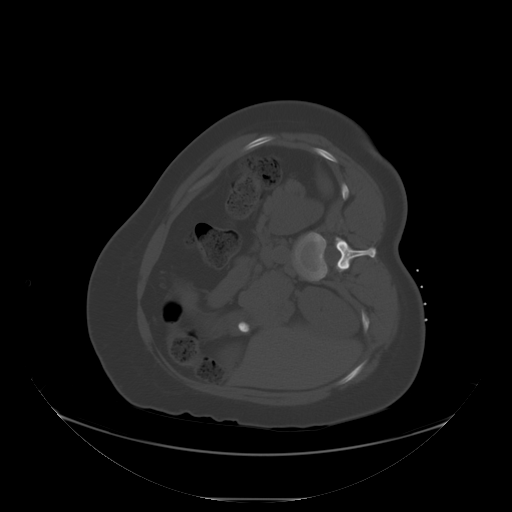
[im 17/128  soft-tissue]
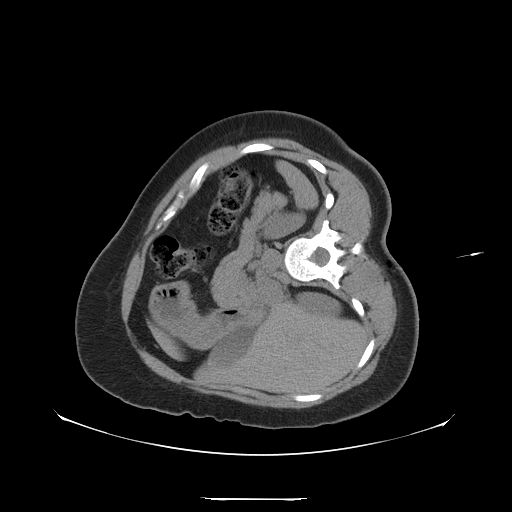
[im 26/128  soft-tissue]
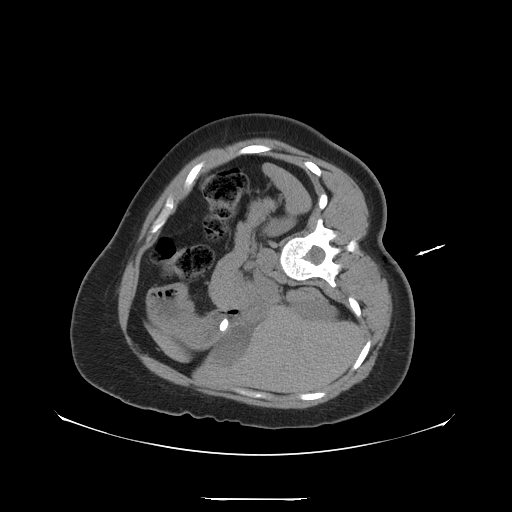
[im 34/128  soft-tissue]
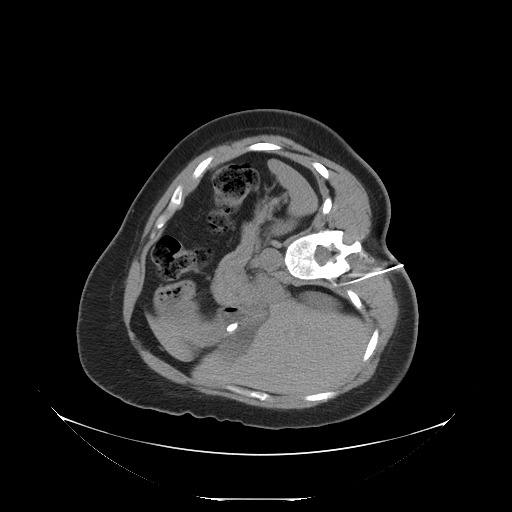
[im 43/128  soft-tissue]
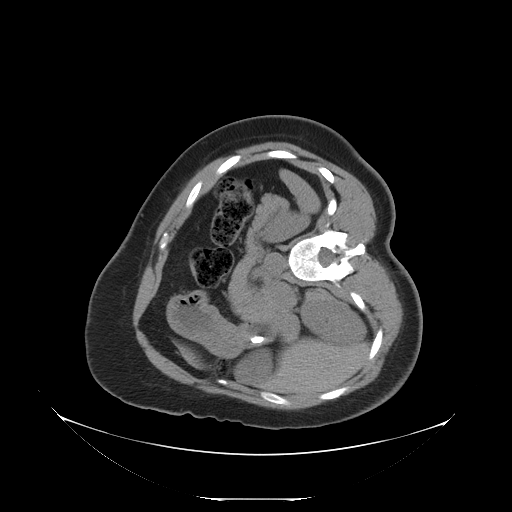
[im 51/128  soft-tissue]
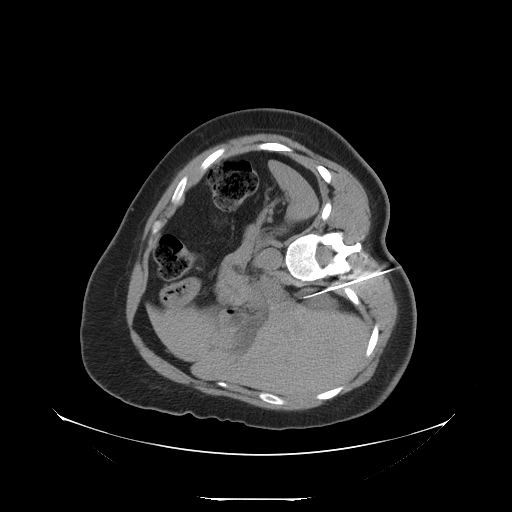
[im 60/128  soft-tissue]
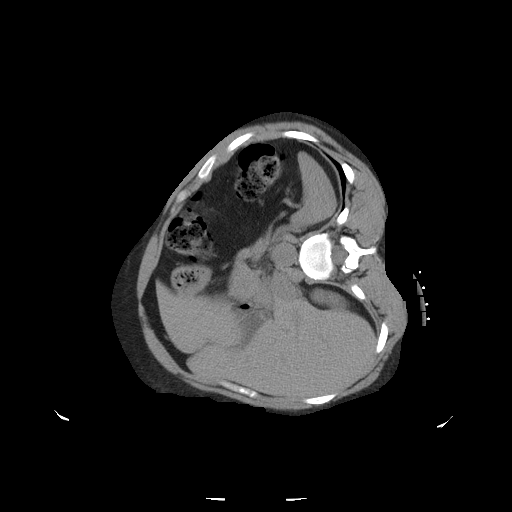
[im 68/128  soft-tissue]
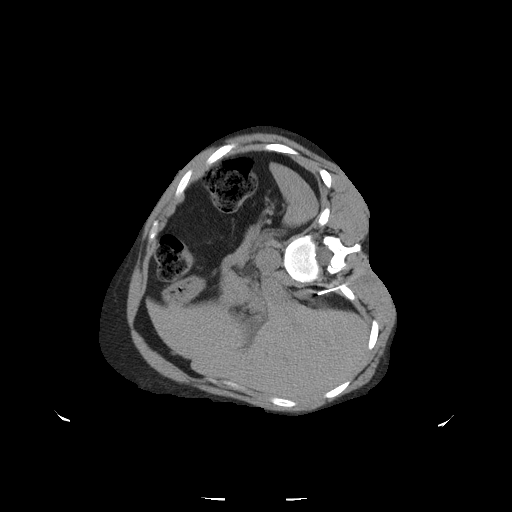
[im 77/128  soft-tissue]
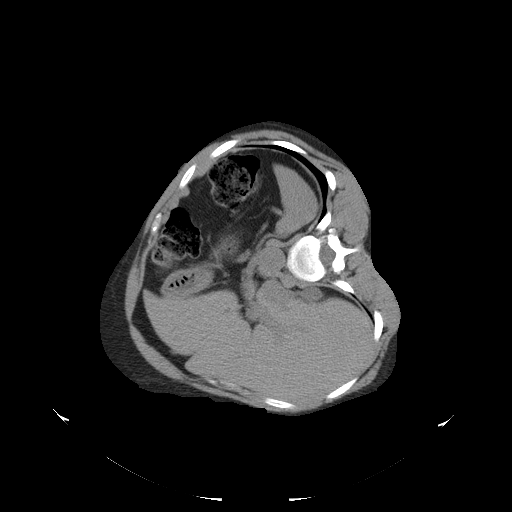
[im 77/128  bone]
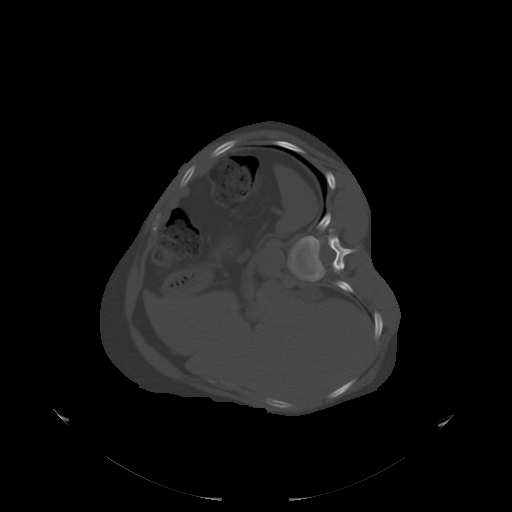
[im 85/128  soft-tissue]
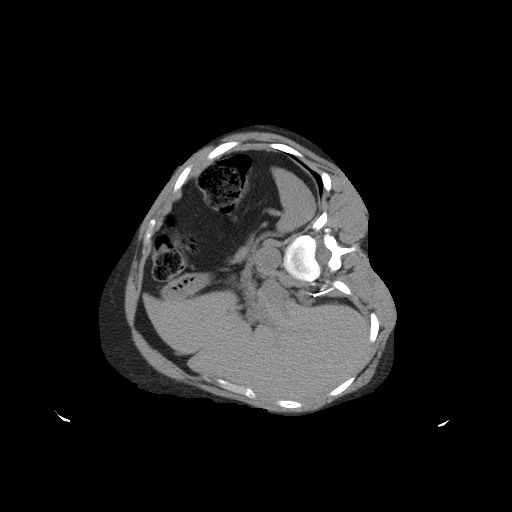
[im 94/128  soft-tissue]
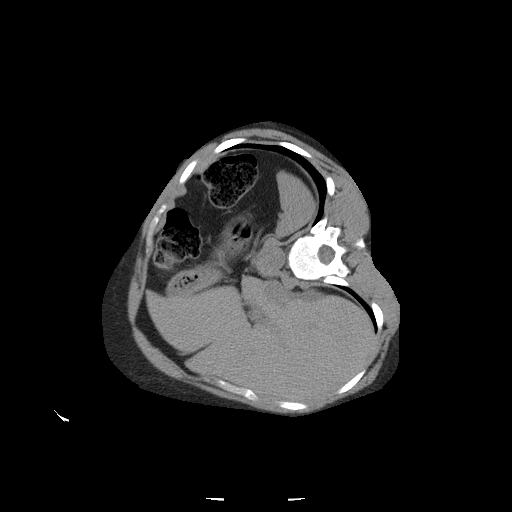
[im 102/128  soft-tissue]
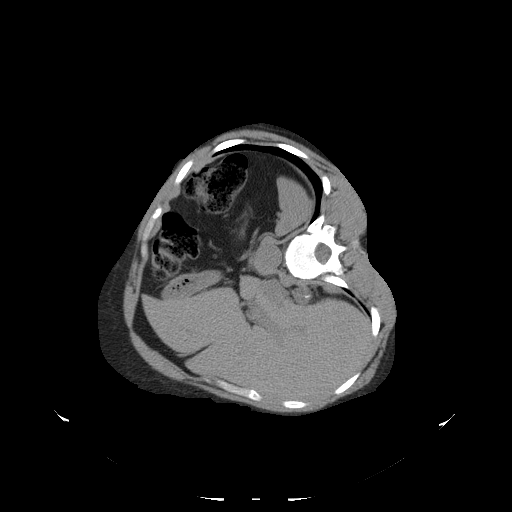
[im 106/128  lung]
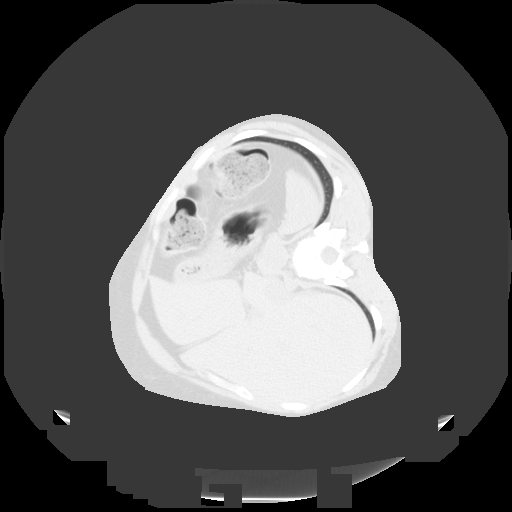
[im 111/128  soft-tissue]
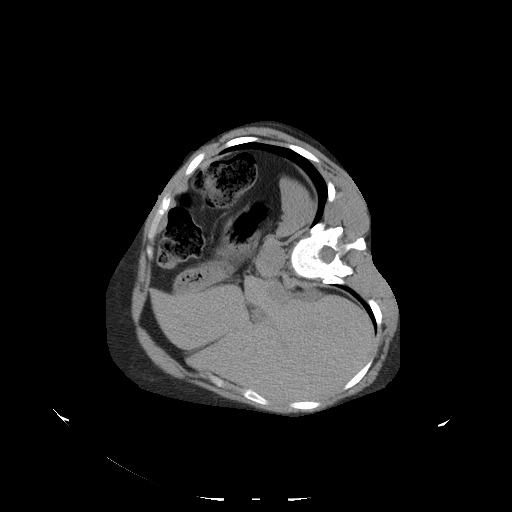
[im 111/128  lung]
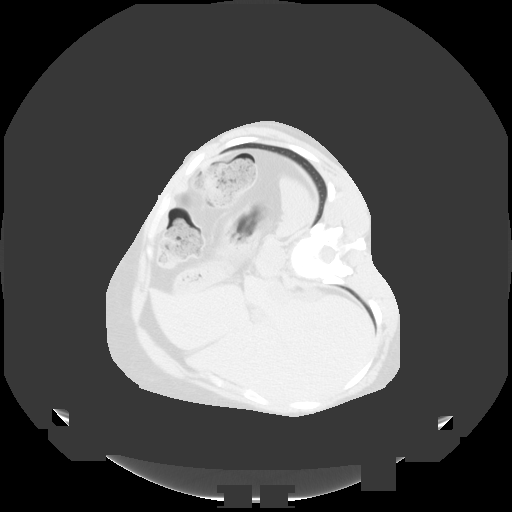
[im 119/128  soft-tissue]
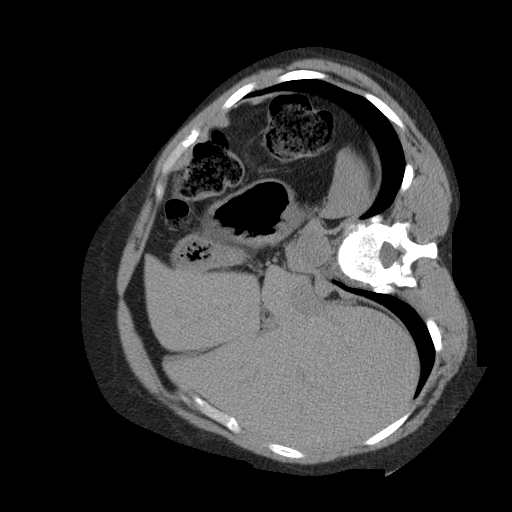
[im 119/128  lung]
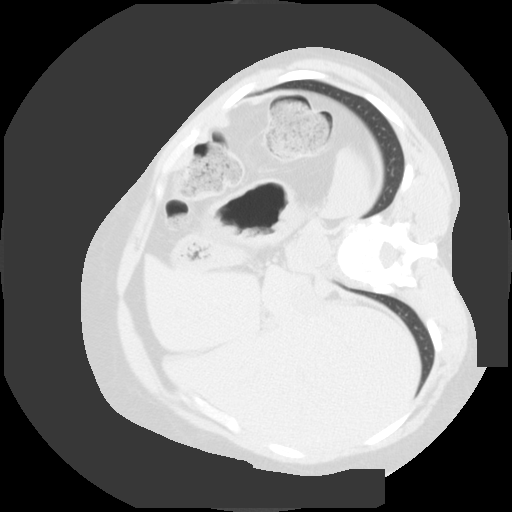
[im 123/128  lung]
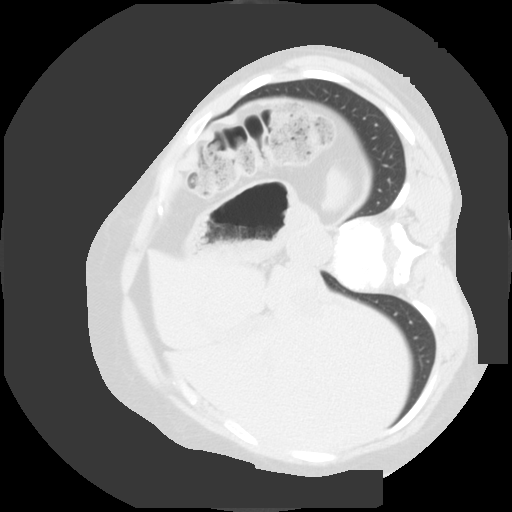

[16 of 32 positions shown; findings below may reference images not displayed]

FINDINGS: The images document needle placement in the right adrenal nodule.  Post-biopsy images demonstrate no hematoma.  Supine post-images demonstrate no pneumothorax.
IMPRESSION: Right adrenal nodule biopsy without complication.

## 2007-09-12 IMAGING — CT CT HEAD W/O CM
1 series · 16 of 30 positions shown, 20 images · IV contrast (agent unspecified)
Comparison: Report of head CT scan 10/01/2000 and brain MR 10/02/2000 reviewed.

CLINICAL DATA: Dizziness, shortness of breath. 
 HEAD CT WITHOUT CONTRAST:
TECHNIQUE: Contiguous axial images were obtained from the base of the skull through the vertex according to standard protocol without contrast.

[Series 2: headseq 4.8 h45s · axial · 0.42mm/px · z∈[-200,-74]mm · 16 of 30 slices shown, 20 images]
[im 2/30  brain]
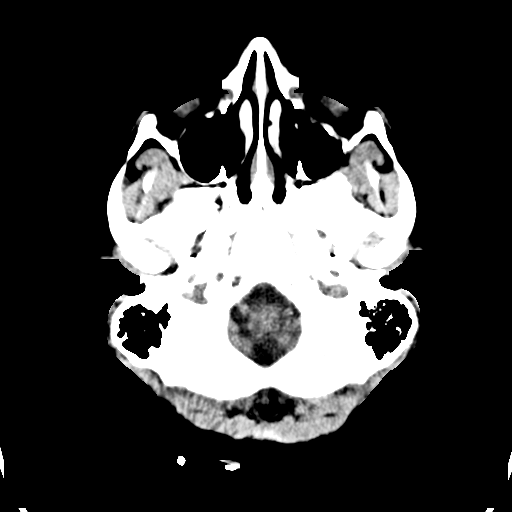
[im 2/30  bone]
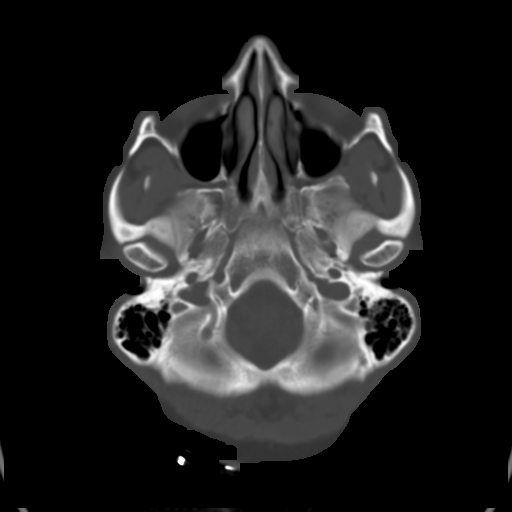
[im 4/30  brain]
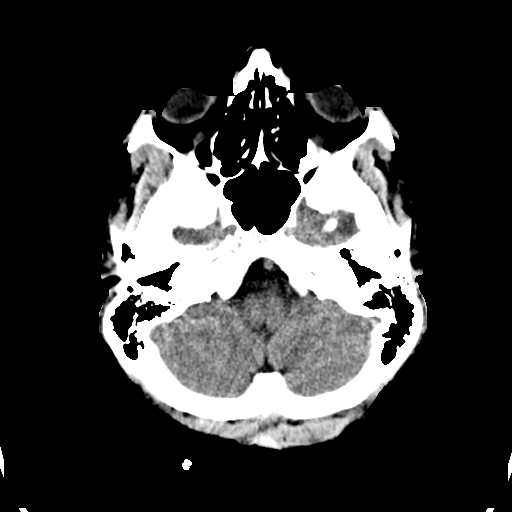
[im 6/30  brain]
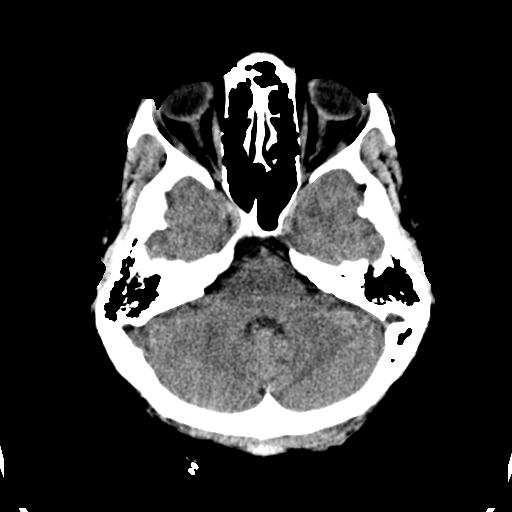
[im 8/30  brain]
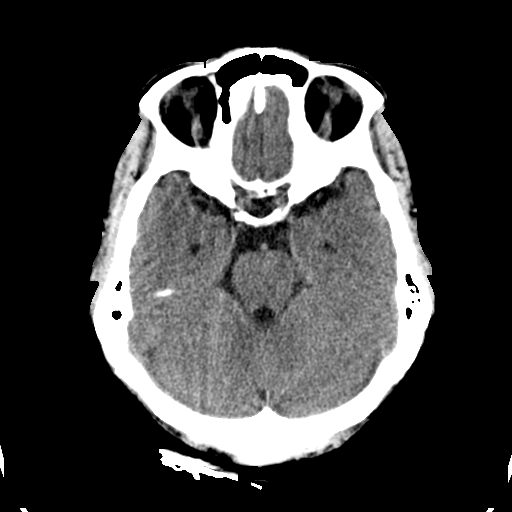
[im 9/30  brain]
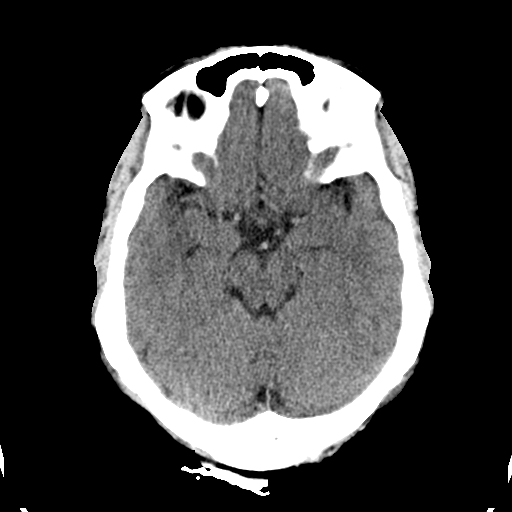
[im 9/30  bone]
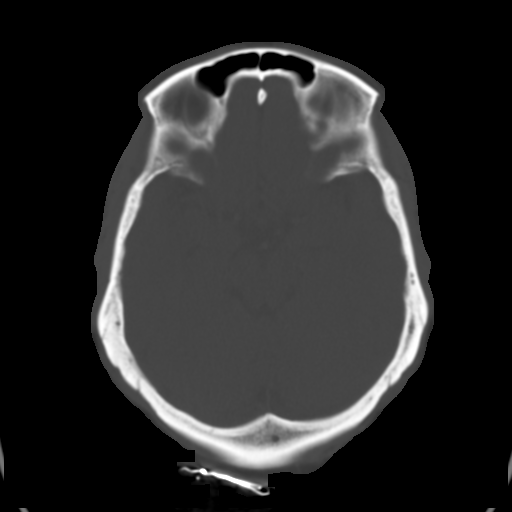
[im 11/30  brain]
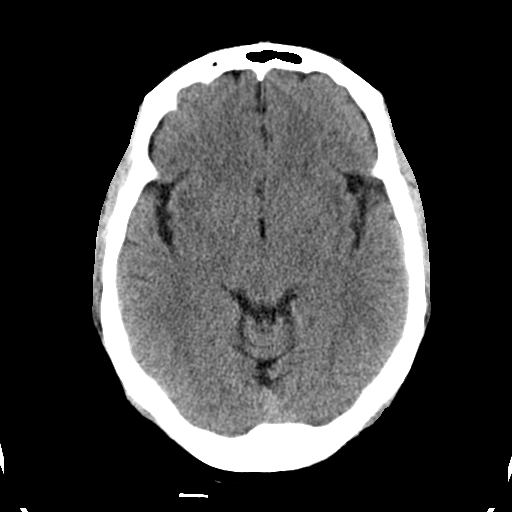
[im 13/30  brain]
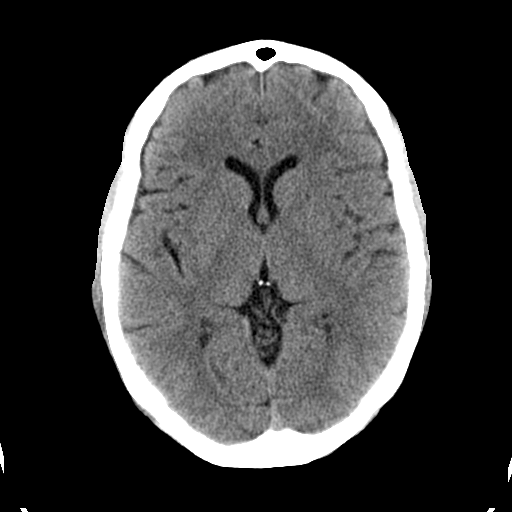
[im 15/30  brain]
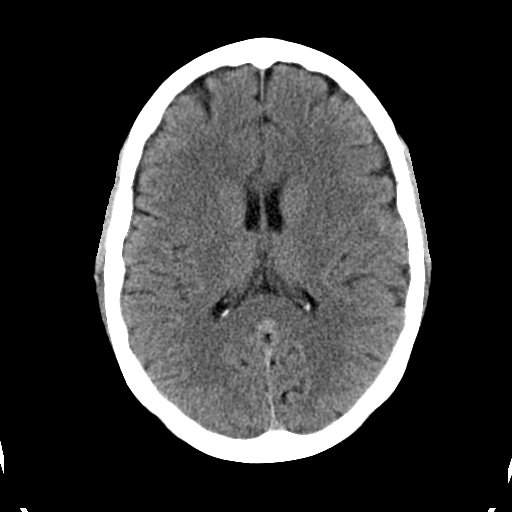
[im 16/30  brain]
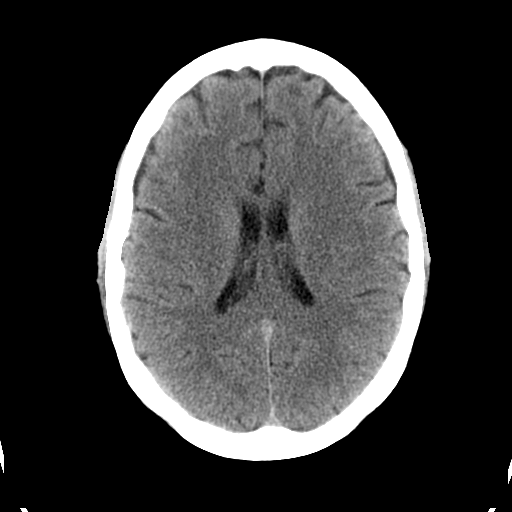
[im 16/30  bone]
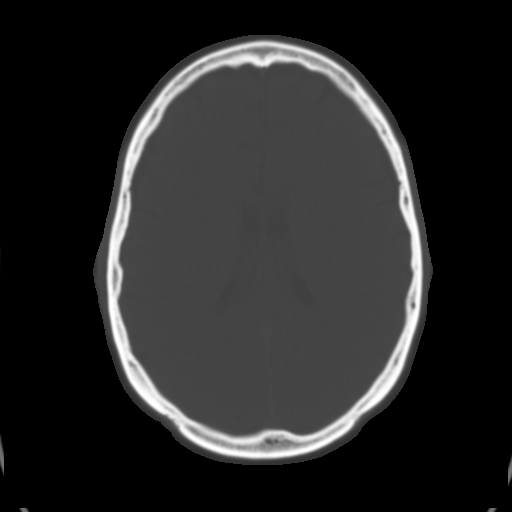
[im 18/30  brain]
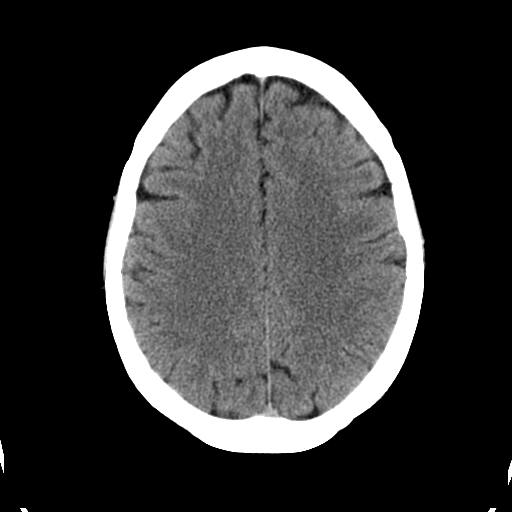
[im 20/30  brain]
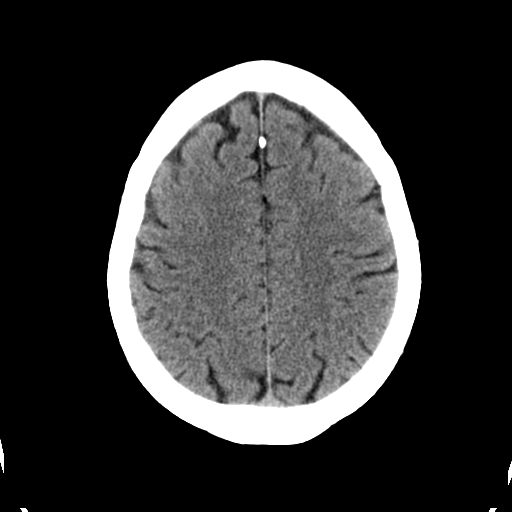
[im 22/30  brain]
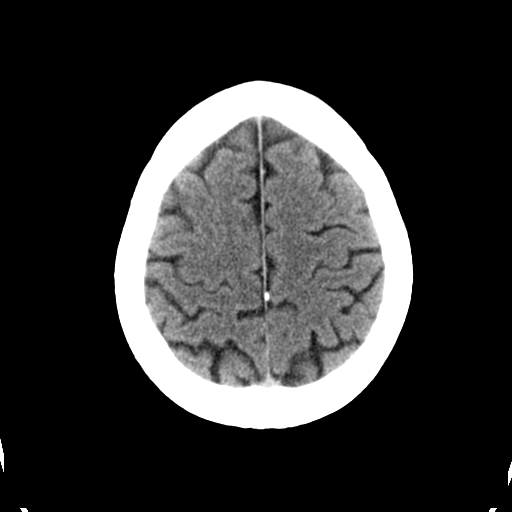
[im 23/30  brain]
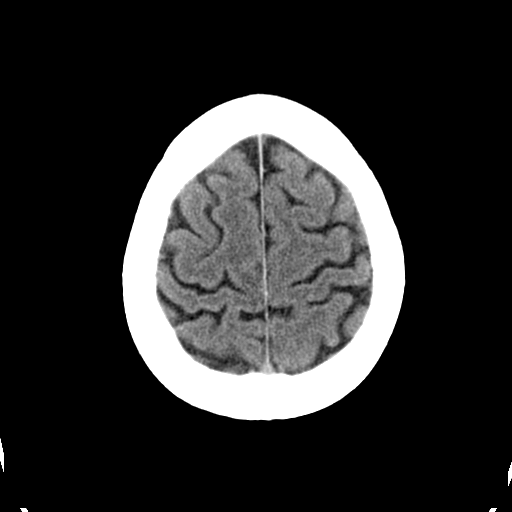
[im 23/30  bone]
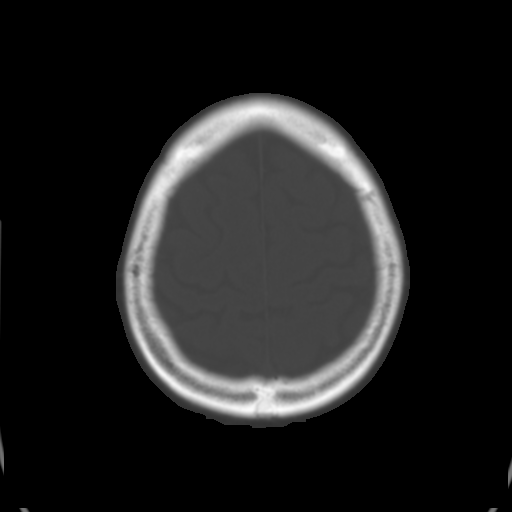
[im 25/30  brain]
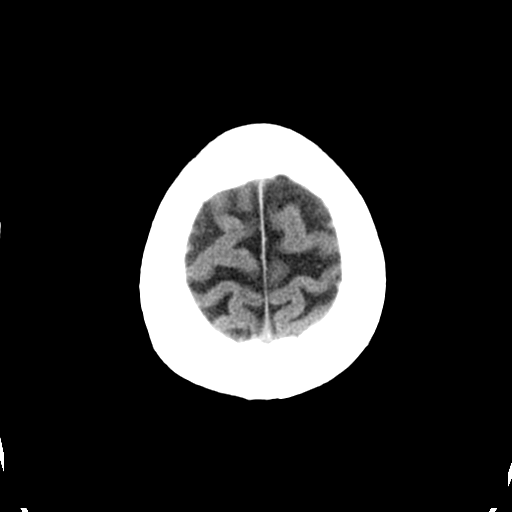
[im 27/30  brain]
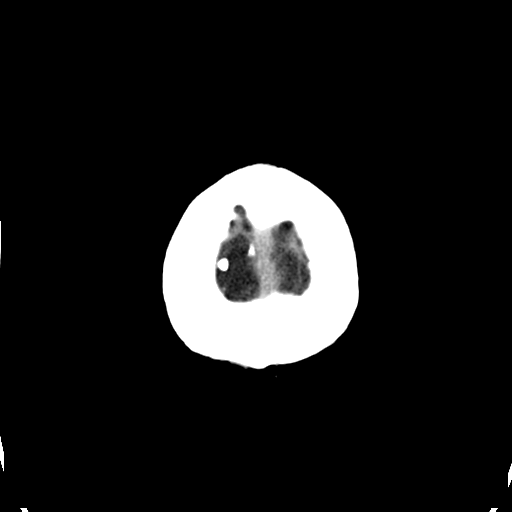
[im 29/30  brain]
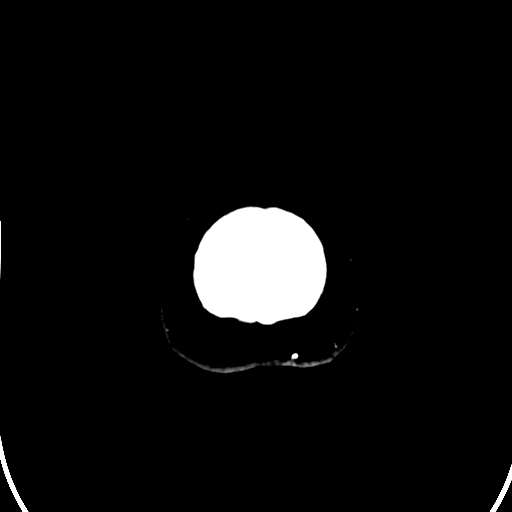

[16 of 30 positions shown; findings below may reference images not displayed]

FINDINGS: The brain appears normal without evidence of hemorrhage, infarct, mass, mass effect, midline shift or abnormal extraaxial fluid collection.  No hydrocephalus.  Imaged paranasal sinuses and mastoid air cells are clear.  No focal bony abnormality.
IMPRESSION: Negative head CT.

## 2007-11-15 ENCOUNTER — Ambulatory Visit: Payer: Self-pay | Admitting: Oncology

## 2007-11-22 LAB — FERRITIN: Ferritin: 55 ng/mL (ref 10–291)

## 2007-11-22 LAB — BETA-2 GLYCOPROTEIN ANTIBODIES
Beta-2 Glyco I IgG: <4 U/mL (ref <20)
Beta-2-Glycoprotein I IgA: <4 U/mL (ref <10)
Beta-2-Glycoprotein I IgM: <4 U/mL (ref <10)

## 2007-11-22 LAB — CARDIOLIPIN ANTIBODIES, IGG, IGM, IGA
Anticardiolipin IgA: 10 [APL'U] (ref <13)
Anticardiolipin IgG: <7 [GPL'U] (ref <11)
Anticardiolipin IgM: <7 [MPL'U] (ref <10)

## 2007-12-02 LAB — LUPUS ANTICOAGULANT PANEL
DRVVT 1:1 Mix: 40.1 secs (ref 36.1–47.0)
DRVVT: 48.8 secs — ABNORMAL HIGH (ref 36.1–47.0)
Lupus Anticoagulant: DETECTED
PTT Lupus Anticoagulant: 59.4 secs — ABNORMAL HIGH (ref 36.3–48.8)
PTTLA 4:1 Mix: 50.7 secs — ABNORMAL HIGH (ref 36.3–48.8)
PTTLA Confirmation: 12.1 secs — ABNORMAL HIGH (ref <8.0)

## 2007-12-02 LAB — CARDIOLIPIN ANTIBODIES, IGG, IGM, IGA
Anticardiolipin IgA: 9 [APL'U] (ref <13)
Anticardiolipin IgG: <7 [GPL'U] (ref <11)
Anticardiolipin IgM: <7 [MPL'U] (ref <10)

## 2007-12-02 LAB — D-DIMER, QUANTITATIVE: D-Dimer, Quant: 0.81 ug/mL-FEU — ABNORMAL HIGH (ref 0.00–0.48)

## 2007-12-02 LAB — BETA-2 GLYCOPROTEIN ANTIBODIES
Beta-2 Glyco I IgG: <4 U/mL (ref <20)
Beta-2-Glycoprotein I IgA: <4 U/mL (ref <10)
Beta-2-Glycoprotein I IgM: <4 U/mL (ref <10)

## 2007-12-05 LAB — HEXAGONAL PHOSPHOLIPID NEUTRALIZATION: Hex Phosph Neut Test: POSITIVE — AB

## 2007-12-13 ENCOUNTER — Encounter: Admission: RE | Admit: 2007-12-13 | Discharge: 2007-12-13 | Payer: Self-pay | Admitting: Internal Medicine

## 2007-12-17 ENCOUNTER — Encounter: Admission: RE | Admit: 2007-12-17 | Discharge: 2008-01-23 | Payer: Self-pay | Admitting: Orthopedic Surgery

## 2008-01-16 ENCOUNTER — Ambulatory Visit: Payer: Self-pay | Admitting: Oncology

## 2008-01-20 LAB — LIPID PANEL
Cholesterol: 203 mg/dL — ABNORMAL HIGH (ref 0–200)
HDL: 65 mg/dL (ref >39)
LDL Cholesterol: 114 mg/dL — ABNORMAL HIGH (ref 0–99)
Total CHOL/HDL Ratio: 3.1 Ratio
Triglycerides: 119 mg/dL (ref <150)
VLDL: 24 mg/dL (ref 0–40)

## 2008-01-20 LAB — CBC WITH DIFFERENTIAL/PLATELET
BASO%: 0.5 % (ref 0.0–2.0)
Basophils Absolute: 0 10*3/uL (ref 0.0–0.1)
EOS%: 3.1 % (ref 0.0–7.0)
Eosinophils Absolute: 0.1 10*3/uL (ref 0.0–0.5)
HCT: 36.2 % (ref 34.8–46.6)
HGB: 12.1 g/dL (ref 11.6–15.9)
LYMPH%: 38.5 % (ref 14.0–48.0)
MCH: 28.6 pg (ref 26.0–34.0)
MCHC: 33.4 g/dL (ref 32.0–36.0)
MCV: 85.7 fL (ref 81.0–101.0)
MONO#: 0.4 10*3/uL (ref 0.1–0.9)
MONO%: 10.3 % (ref 0.0–13.0)
NEUT#: 2 10*3/uL (ref 1.5–6.5)
NEUT%: 47.6 % (ref 39.6–76.8)
Platelets: 294 10*3/uL (ref 145–400)
RBC: 4.23 10*6/uL (ref 3.70–5.32)
RDW: 16.7 % — ABNORMAL HIGH (ref 11.3–14.5)
WBC: 4.2 10*3/uL (ref 3.9–10.0)
lymph#: 1.6 10*3/uL (ref 0.9–3.3)

## 2008-01-20 LAB — PROTIME-INR
INR: 1.3 — ABNORMAL LOW (ref 2.00–3.50)
Protime: 15.6 Seconds — ABNORMAL HIGH (ref 10.6–13.4)

## 2008-01-20 LAB — WHOLE BLOOD GLUCOSE: Glucose: 114 mg/dL — ABNORMAL HIGH (ref 70–100)

## 2008-01-20 LAB — ERYTHROCYTE SEDIMENTATION RATE: Sed Rate: 66 mm/hr — ABNORMAL HIGH (ref 0–30)

## 2008-01-22 LAB — COMPREHENSIVE METABOLIC PANEL
ALT: 26 U/L (ref 0–35)
AST: 17 U/L (ref 0–37)
Albumin: 4.5 g/dL (ref 3.5–5.2)
Alkaline Phosphatase: 96 U/L (ref 39–117)
BUN: 12 mg/dL (ref 6–23)
CO2: 25 mEq/L (ref 19–32)
Calcium: 9.7 mg/dL (ref 8.4–10.5)
Chloride: 101 mEq/L (ref 96–112)
Creatinine, Ser: 0.76 mg/dL (ref 0.40–1.20)
Glucose, Bld: 112 mg/dL — ABNORMAL HIGH (ref 70–99)
Potassium: 3.3 mEq/L — ABNORMAL LOW (ref 3.5–5.3)
Sodium: 139 mEq/L (ref 135–145)
Total Bilirubin: 0.4 mg/dL (ref 0.3–1.2)
Total Protein: 7.9 g/dL (ref 6.0–8.3)

## 2008-01-22 LAB — ANA: Anti Nuclear Antibody(ANA): NEGATIVE

## 2008-01-22 LAB — LUPUS ANTICOAGULANT PANEL
DRVVT 1:1 Mix: 39.2 secs (ref 36.1–47.0)
DRVVT: 47.4 secs — ABNORMAL HIGH (ref 36.1–47.0)
PTT Lupus Anticoagulant: 50.1 secs — ABNORMAL HIGH (ref 36.3–48.8)
PTTLA 4:1 Mix: 44.8 secs (ref 36.3–48.8)

## 2008-01-22 LAB — BETA-2 GLYCOPROTEIN ANTIBODIES
Beta-2 Glyco I IgG: <4 U/mL (ref <20)
Beta-2-Glycoprotein I IgA: <4 U/mL (ref <10)
Beta-2-Glycoprotein I IgM: <4 U/mL (ref <10)

## 2008-02-03 LAB — PROTIME-INR
INR: 1.2 — ABNORMAL LOW (ref 2.00–3.50)
Protime: 14.4 Seconds — ABNORMAL HIGH (ref 10.6–13.4)

## 2008-02-17 LAB — PROTIME-INR
INR: 1.5 — ABNORMAL LOW (ref 2.00–3.50)
Protime: 18 Seconds — ABNORMAL HIGH (ref 10.6–13.4)

## 2008-03-02 ENCOUNTER — Ambulatory Visit: Payer: Self-pay | Admitting: Oncology

## 2008-03-02 LAB — PROTIME-INR
INR: 1.2 — ABNORMAL LOW (ref 2.00–3.50)
Protime: 14.4 Seconds — ABNORMAL HIGH (ref 10.6–13.4)

## 2008-03-16 LAB — PROTIME-INR
INR: 1.3 — ABNORMAL LOW (ref 2.00–3.50)
Protime: 15.6 Seconds — ABNORMAL HIGH (ref 10.6–13.4)

## 2008-04-01 LAB — PROTIME-INR
INR: 1.3 — ABNORMAL LOW (ref 2.00–3.50)
Protime: 15.6 Seconds — ABNORMAL HIGH (ref 10.6–13.4)

## 2008-04-02 ENCOUNTER — Encounter: Payer: Self-pay | Admitting: Oncology

## 2008-04-02 ENCOUNTER — Ambulatory Visit: Payer: Self-pay | Admitting: Surgery

## 2008-04-02 ENCOUNTER — Ambulatory Visit: Admission: RE | Admit: 2008-04-02 | Discharge: 2008-04-02 | Payer: Self-pay | Admitting: Oncology

## 2008-04-09 LAB — PROTIME-INR
INR: 3.5 (ref 2.00–3.50)
Protime: 42 Seconds — ABNORMAL HIGH (ref 10.6–13.4)

## 2008-04-13 ENCOUNTER — Ambulatory Visit: Payer: Self-pay | Admitting: Oncology

## 2008-04-13 IMAGING — CT CT ANGIO CHEST
2 of 3 series · 19 of 46 positions shown · IV contrast (omnipaque)
Comparison: none

Addendum BeginsOriginal report by Dr. Eric Fabrice Alkali.  Following addendum by Dr. Eljeffe   on 10/26/05:

 The adrenal nodule is stable compared to a prior abdominal CT from 02/12/05.  It has also been biopsied in [REDACTED], resulting in benign disease.  No other further workup necessary.  
 Addendum Ends
CLINICAL DATA: Chest pain, shortness of breath.
 CT ANGIOGRAPHY OF CHEST:
TECHNIQUE: Multidetector CT imaging of the chest was performed during bolus injection of intravenous contrast.  Multiplanar CT angiographic image reconstructions were generated to evaluate the vascular anatomy.
 Contrast:  80 cc Omnipaque 300

[Series 5: pulm embolism 2.0 st · axial · 0.60mm/px · z∈[+1112,+1390]mm · 16 of 149 slices shown]
[im 5/149  lung]
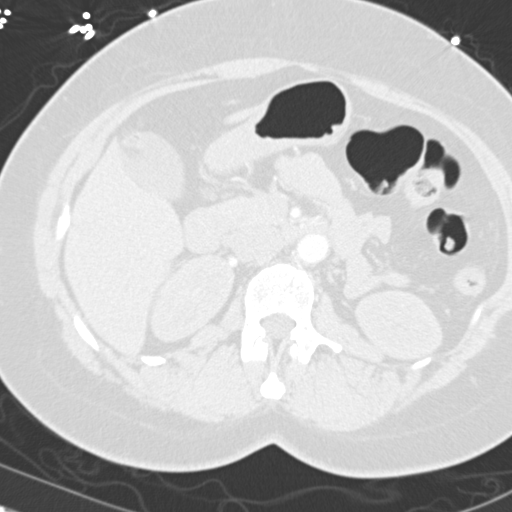
[im 15/149  soft-tissue]
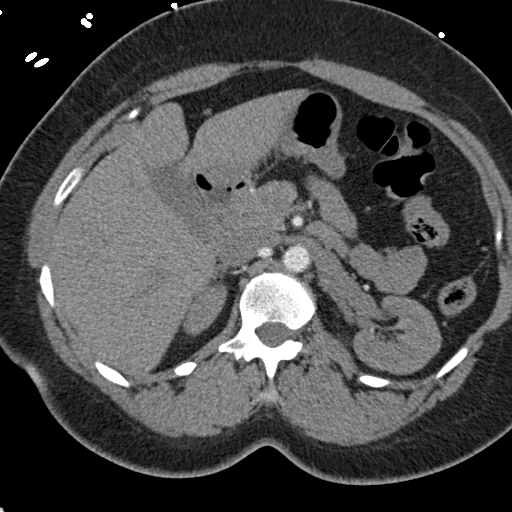
[im 24/149  lung]
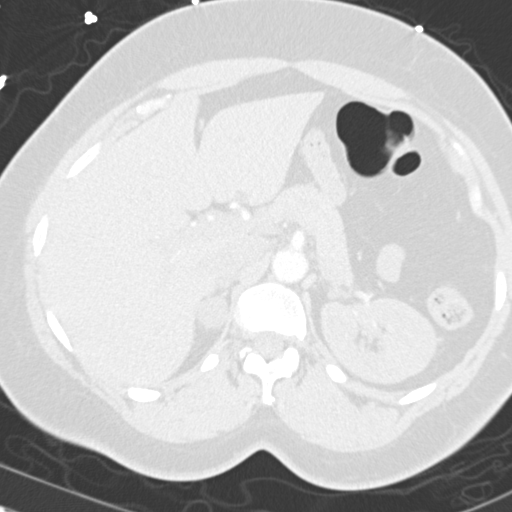
[im 34/149  soft-tissue]
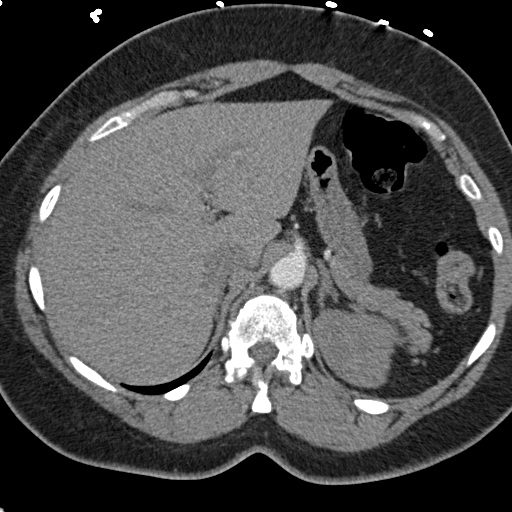
[im 43/149  lung]
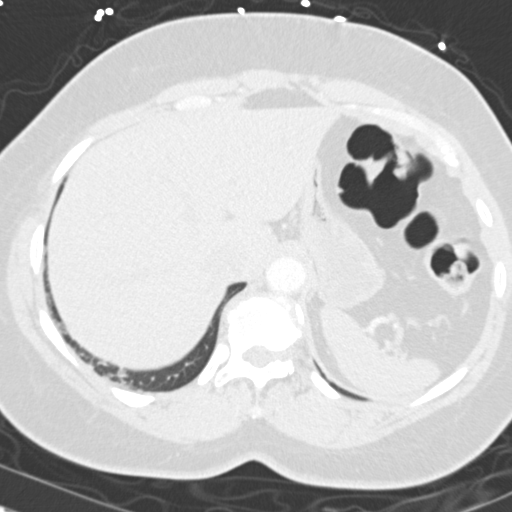
[im 53/149  soft-tissue]
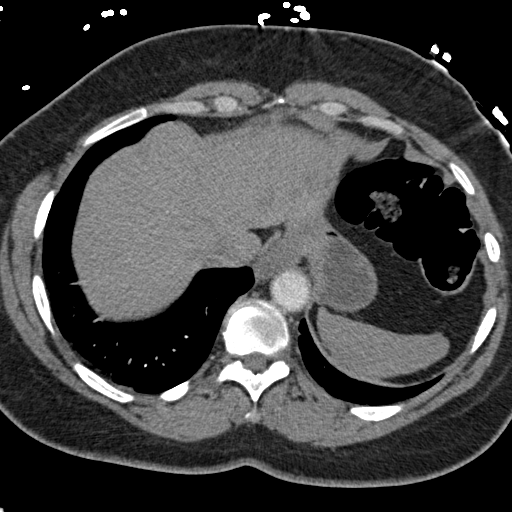
[im 63/149  lung]
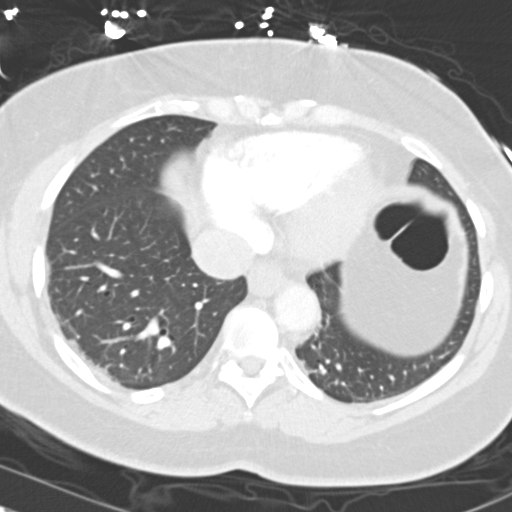
[im 72/149  soft-tissue]
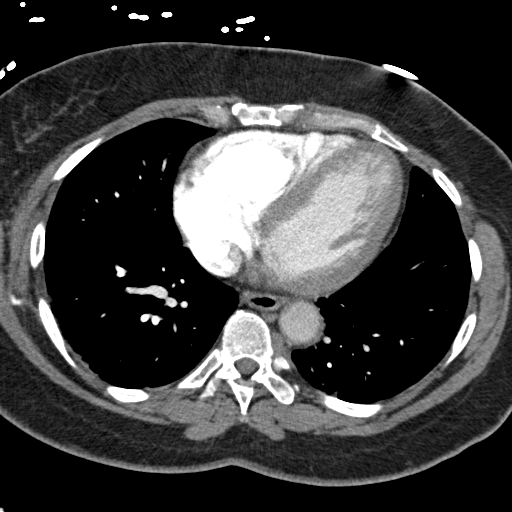
[im 77/149  lung]
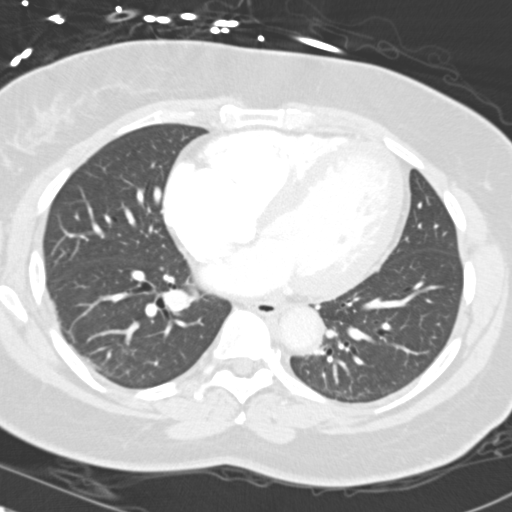
[im 86/149  soft-tissue]
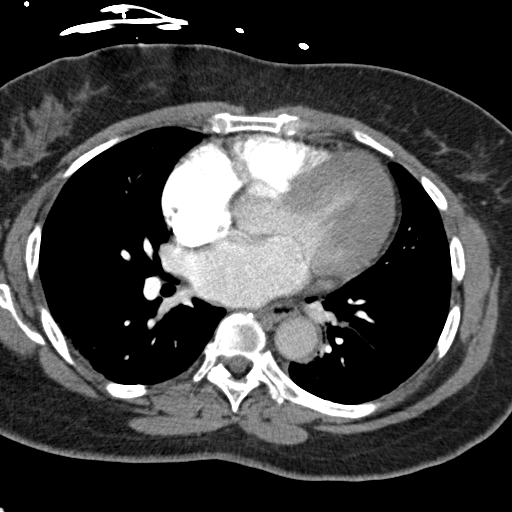
[im 96/149  lung]
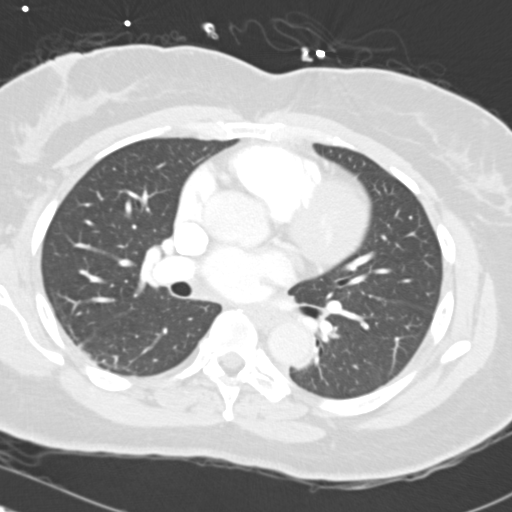
[im 106/149  soft-tissue]
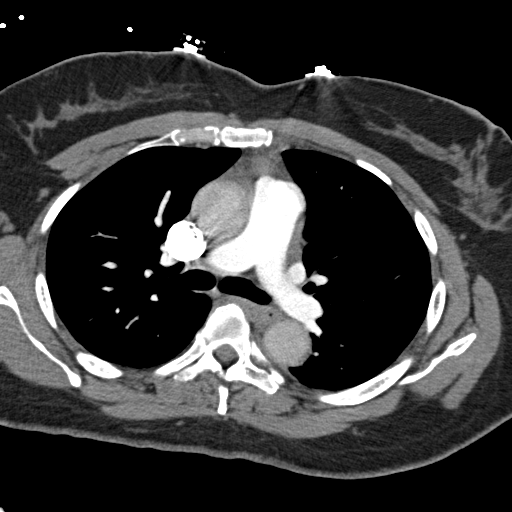
[im 115/149  lung]
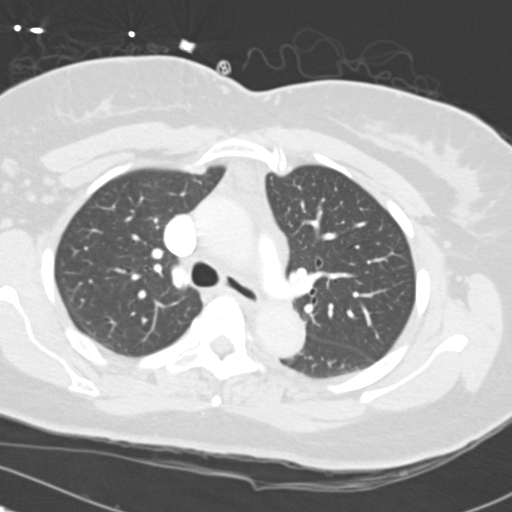
[im 125/149  soft-tissue]
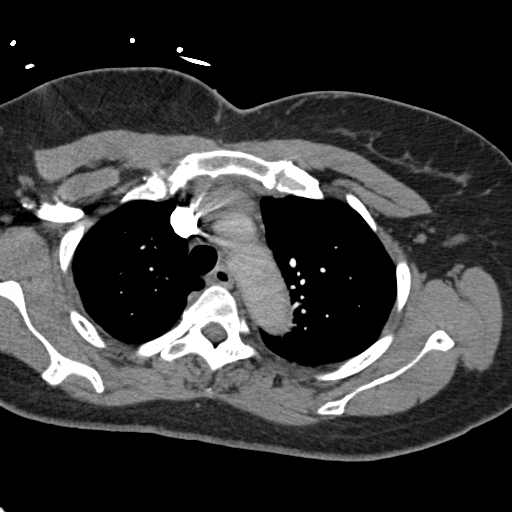
[im 134/149  lung]
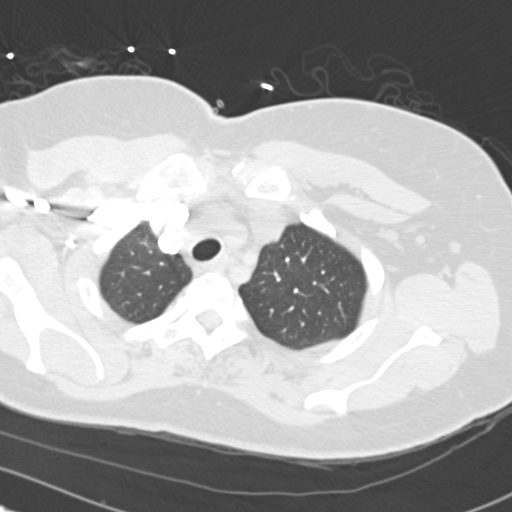
[im 144/149  soft-tissue]
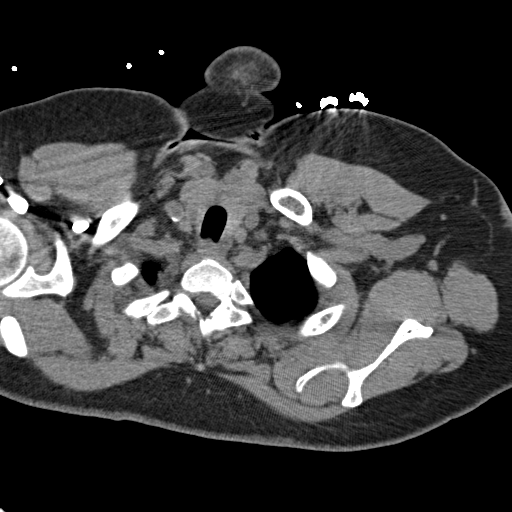

[Series 7: coronals · coronal · 0.63mm/px · 3 of 116 slices shown]
[im 39/116  soft-tissue]
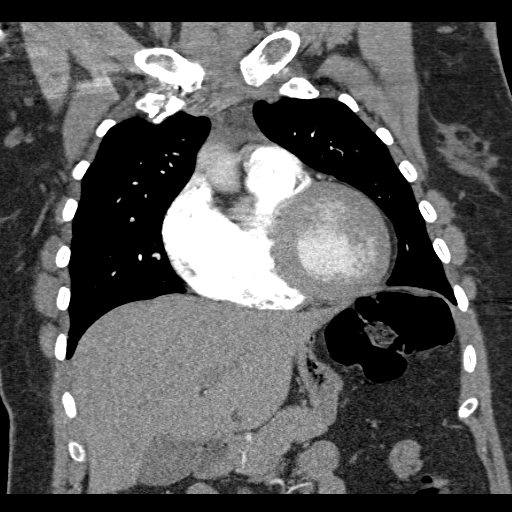
[im 52/116  soft-tissue]
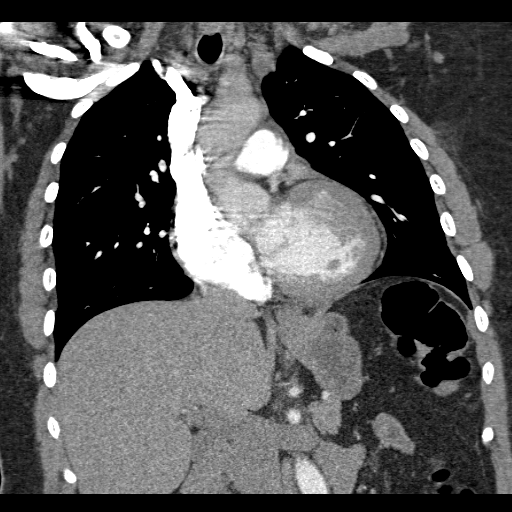
[im 64/116  soft-tissue]
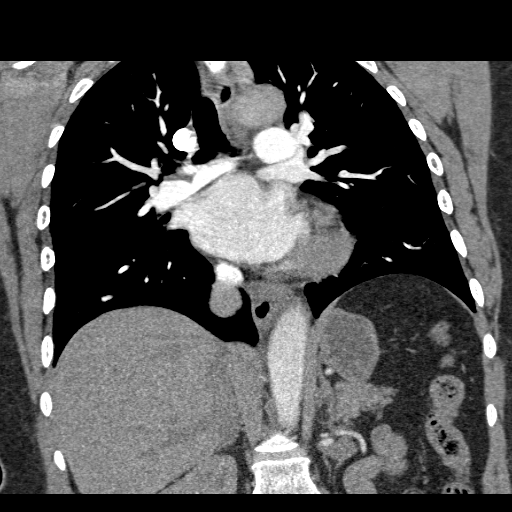

[19 of 46 positions shown; findings below may reference images not displayed]

FINDINGS: There is a 1cm hypodensity in the right lobe of the thyroid gland.  There are no filling defects in the pulmonary arterial tree to suggest pulmonary thromboembolism.  
 Negative pericardial effusion.  Patchy soft tissue density is present in the prevascular space of unknown significance.  The heart is mildly enlarged.  
 No pneumothoraces or effusions are seen.
 Dependent atelectasis is seen bilaterally.  Hiatal hernia is noted.  A nonspecific 2cm right adrenal nodule is noted.
IMPRESSION: 1.  No pulmonary thromboembolism.
 2.  Hiatal hernia.  
 3.  Right thyroid hypodensity.  Ultrasound is warranted for 2cm nonspecific right adrenal nodule.

## 2008-04-15 LAB — PROTIME-INR
INR: 3.9 — ABNORMAL HIGH (ref 2.00–3.50)
Protime: 46.8 Seconds — ABNORMAL HIGH (ref 10.6–13.4)

## 2008-05-01 LAB — PROTIME-INR
INR: 3.7 — ABNORMAL HIGH (ref 2.00–3.50)
Protime: 44.4 Seconds — ABNORMAL HIGH (ref 10.6–13.4)

## 2008-05-08 LAB — PROTIME-INR
INR: 3.1 (ref 2.00–3.50)
Protime: 37.2 Seconds — ABNORMAL HIGH (ref 10.6–13.4)

## 2008-05-14 ENCOUNTER — Encounter: Payer: Self-pay | Admitting: Internal Medicine

## 2008-05-15 LAB — PROTIME-INR
INR: 1.7 — ABNORMAL LOW (ref 2.00–3.50)
Protime: 20.4 Seconds — ABNORMAL HIGH (ref 10.6–13.4)

## 2008-05-20 IMAGING — US US SOFT TISSUE HEAD/NECK
1 series · 14 of 25 positions shown · non-contrast
Comparison: CT chest 10/21/2005.

CLINICAL DATA: 50-year-old, thyroid nodule seen on CT scan. 
 THYROID ULTRASOUND:
TECHNIQUE: Ultrasound examination of the thyroid gland and adjacent soft tissue structures was performed.

[Series 1: unknown · 0.09mm/px · 14 of 37 slices shown]
[im 1/37]
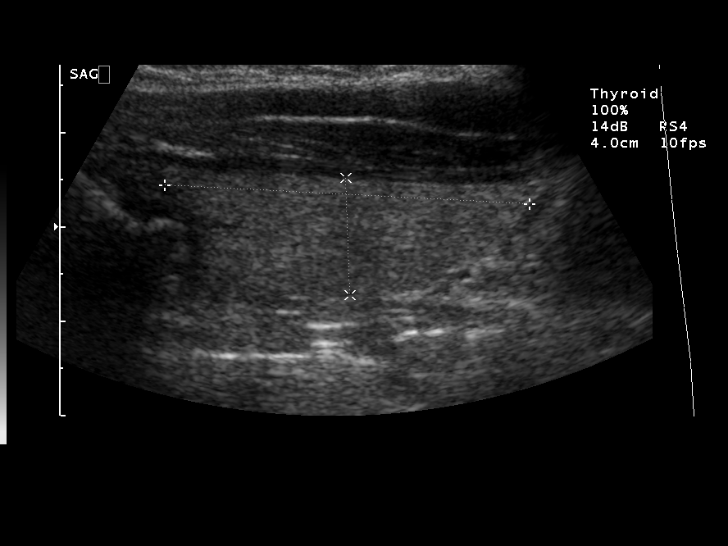
[im 4/37]
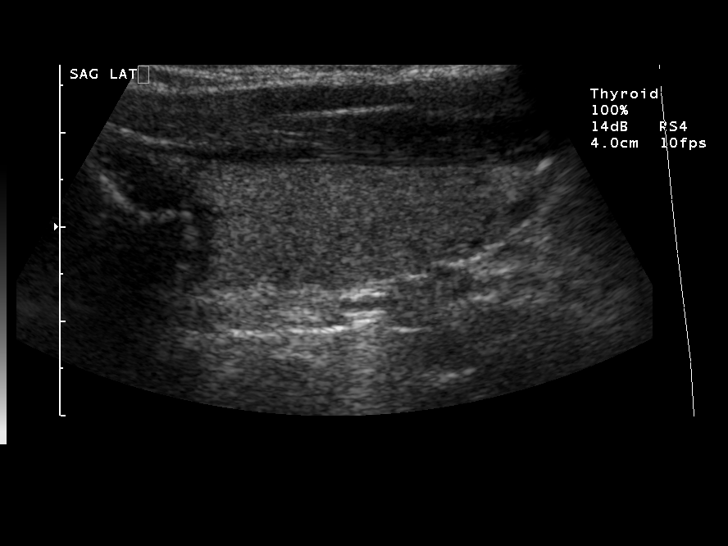
[im 7/37]
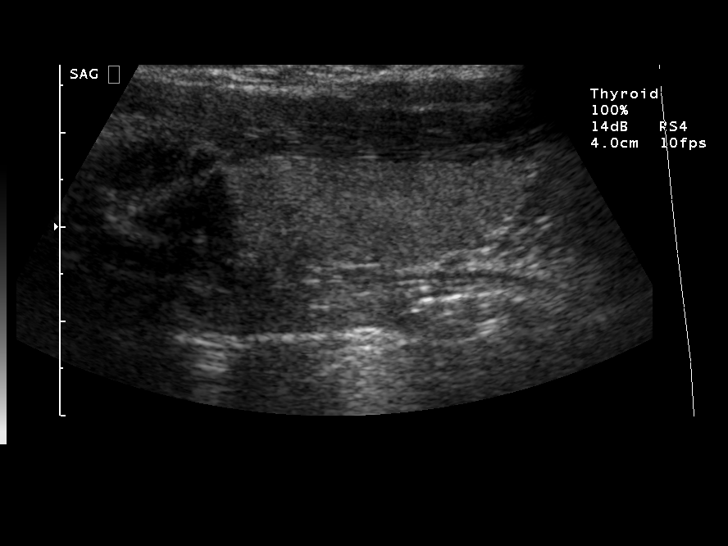
[im 10/37]
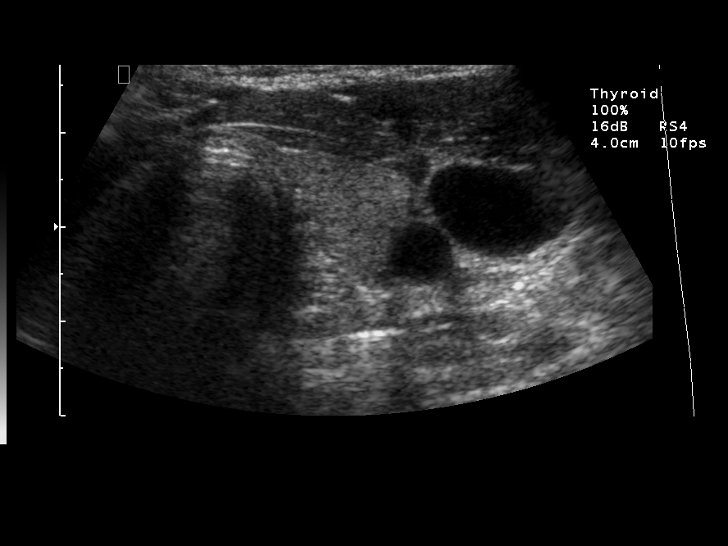
[im 13/37]
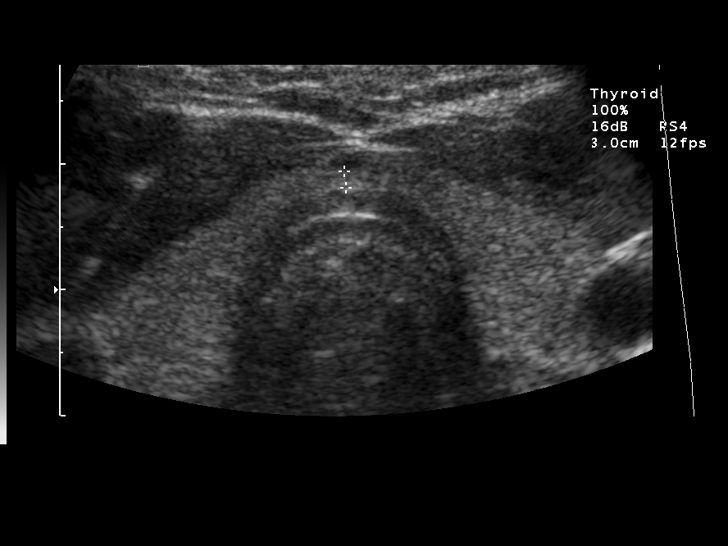
[im 14/37]
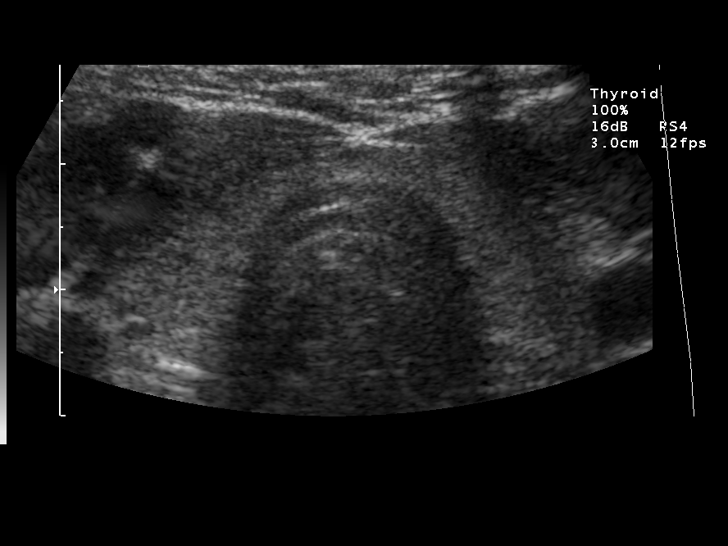
[im 17/37]
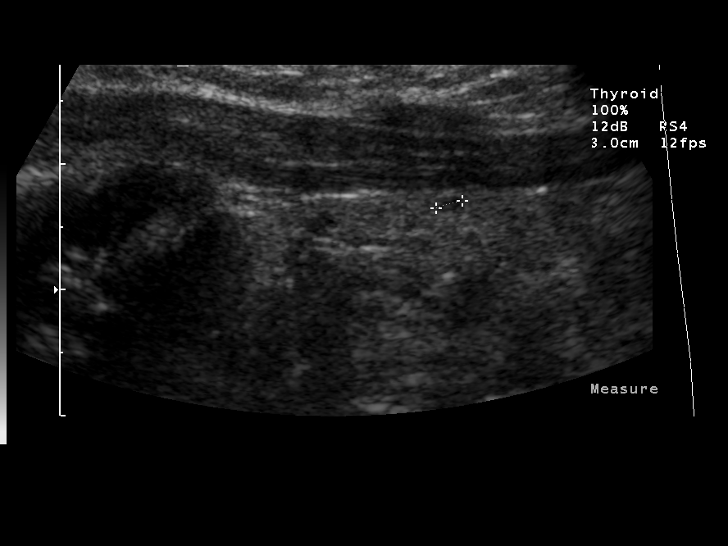
[im 20/37]
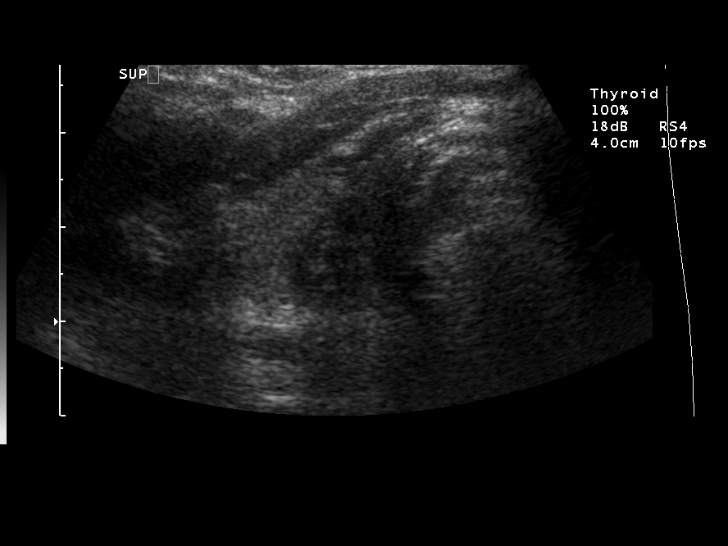
[im 23/37]
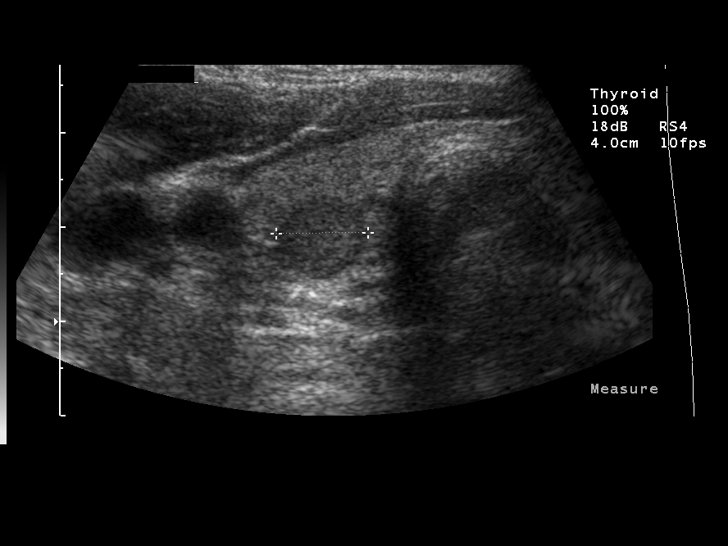
[im 25/37]
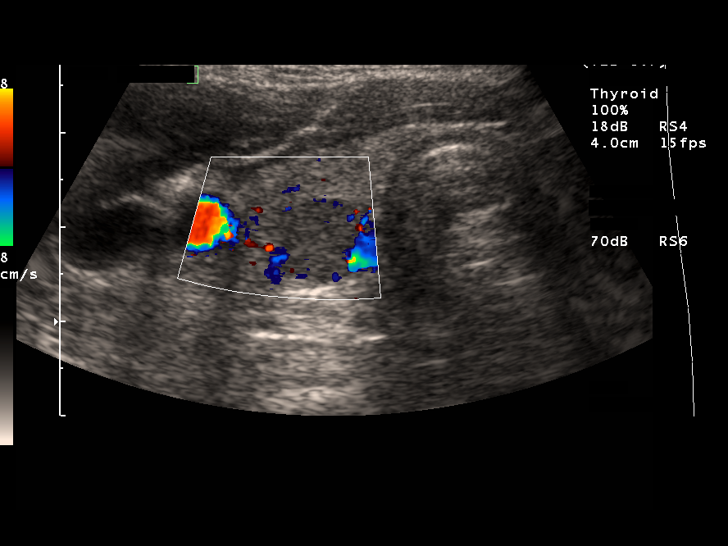
[im 28/37]
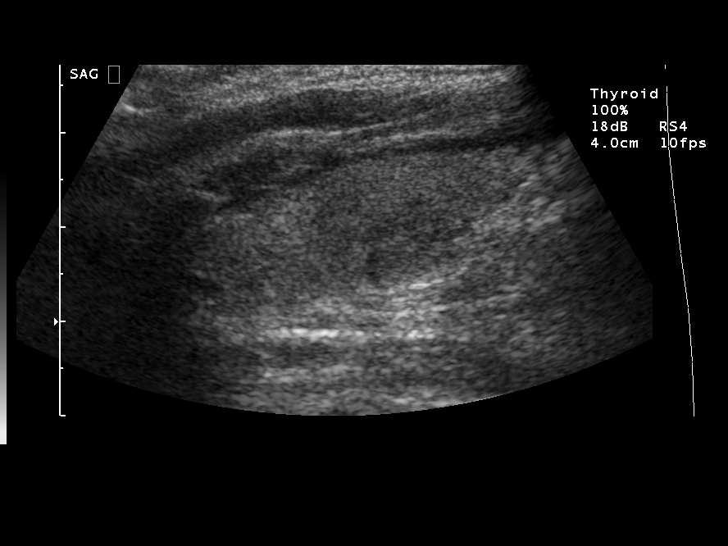
[im 31/37]
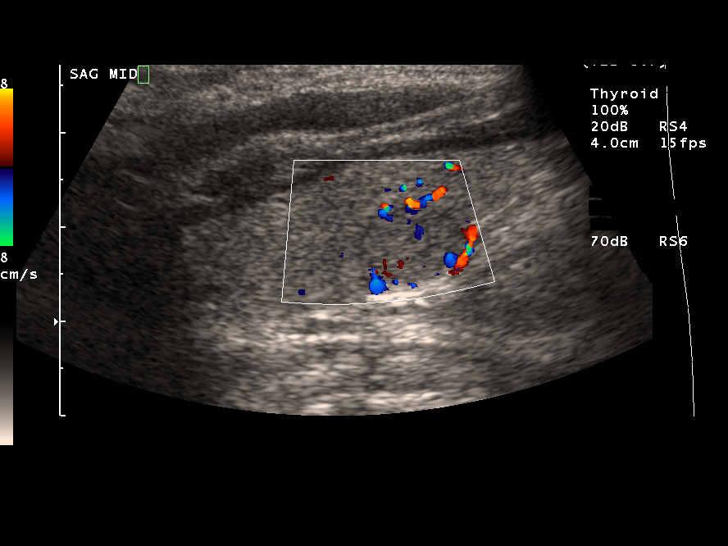
[im 34/37]
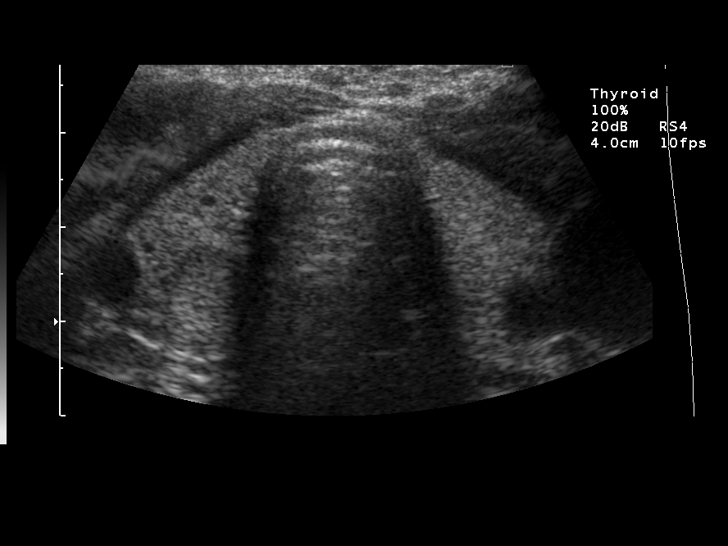
[im 37/37]
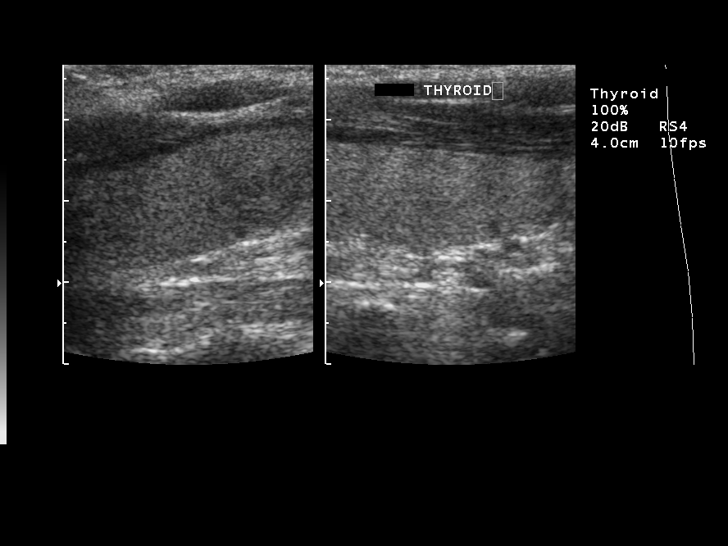

[14 of 25 positions shown; findings below may reference images not displayed]

FINDINGS: The right thyroid lobe measures 3.8 x 1.3 x 1.3.  The left lobe measures 3.9 x 1.0 x 1.0 cm.  The isthmus measures .13 cm.  
 Thyroid echotexture is fairly homogeneous.  There is a 1.2 x .9 x 1.0 cm solid nodule in the right thyroid lobe with slight increased blood flow.  This is borderline in size.  I would recommend final aspiration biopsy.  A tiny cyst is noted near the isthmus.
IMPRESSION: Solid 12 mm right thyroid nodule with slight increased blood flow.  Recommend final aspiration biopsy.

## 2008-05-25 LAB — PROTIME-INR
INR: 1.7 — ABNORMAL LOW (ref 2.00–3.50)
Protime: 20.4 Seconds — ABNORMAL HIGH (ref 10.6–13.4)

## 2008-06-04 ENCOUNTER — Ambulatory Visit: Payer: Self-pay | Admitting: Oncology

## 2008-06-08 LAB — PROTIME-INR
INR: 1.7 — ABNORMAL LOW (ref 2.00–3.50)
Protime: 20.4 Seconds — ABNORMAL HIGH (ref 10.6–13.4)

## 2008-06-20 IMAGING — CT CT PELVIS W/O CM
2 of 3 series · 13 of 32 positions shown, 18 images · non-contrast
Comparison: 02/12/2005 and 07/02/2004

ABDOMEN CT WITHOUT CONTRAST:

CLINICAL DATA: Right lower quadrant pain
TECHNIQUE: Multidetector CT imaging of the abdomen and pelvis was performed
following the standard protocol without IV contrast.

[Series 2: renal stone · axial · 0.82mm/px · z∈[-426,-161]mm · 5 of 81 slices shown, 10 images]
[im 14/81  soft-tissue]
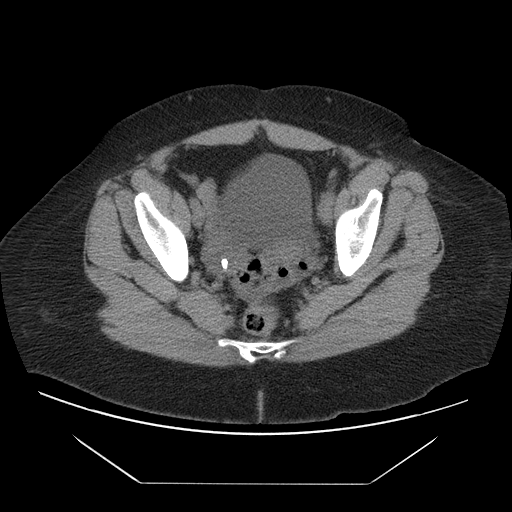
[im 14/81  bone]
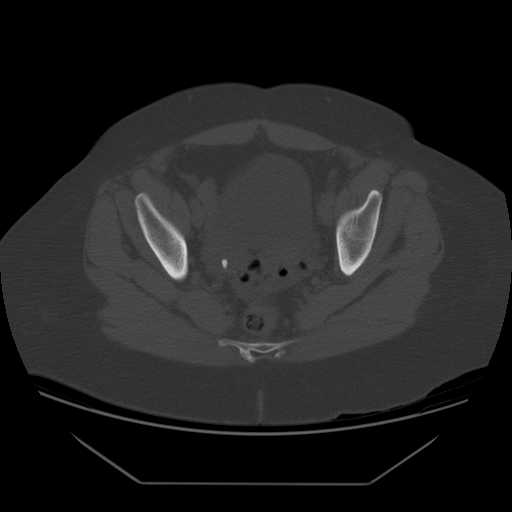
[im 27/81  soft-tissue]
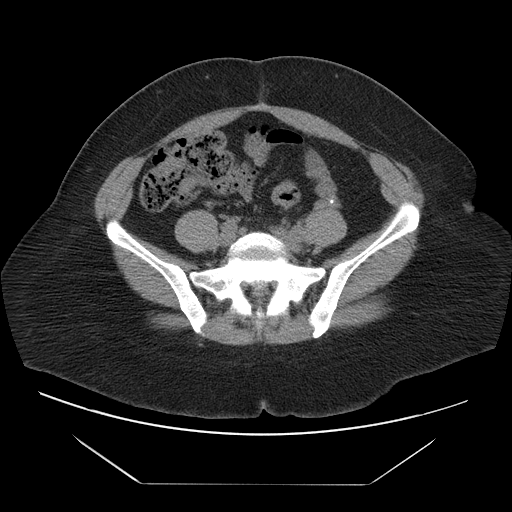
[im 27/81  lung]
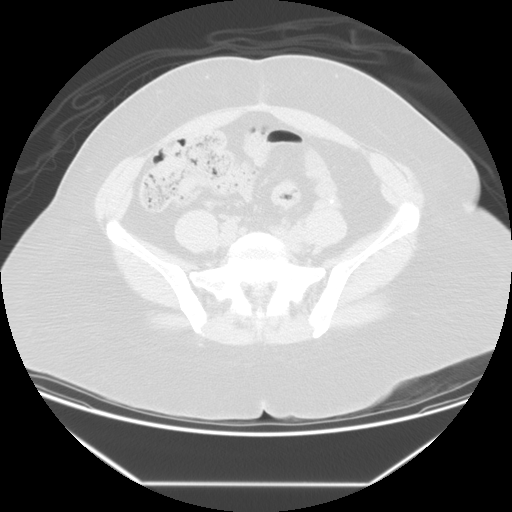
[im 41/81  soft-tissue]
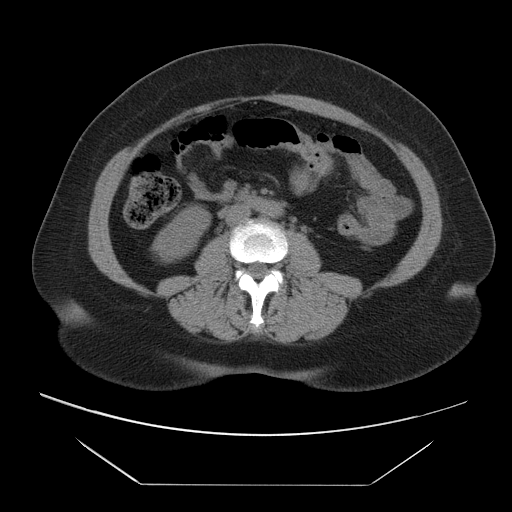
[im 41/81  lung]
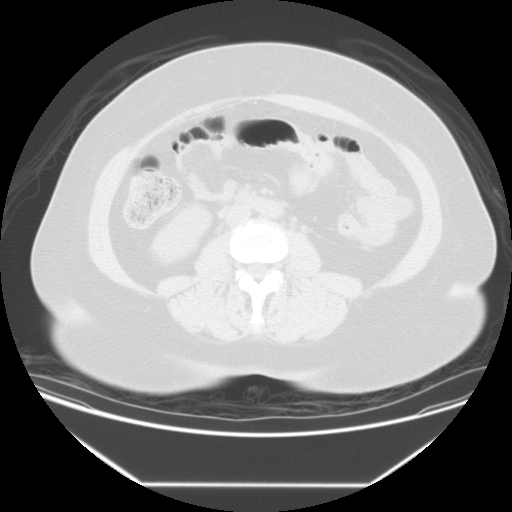
[im 54/81  soft-tissue]
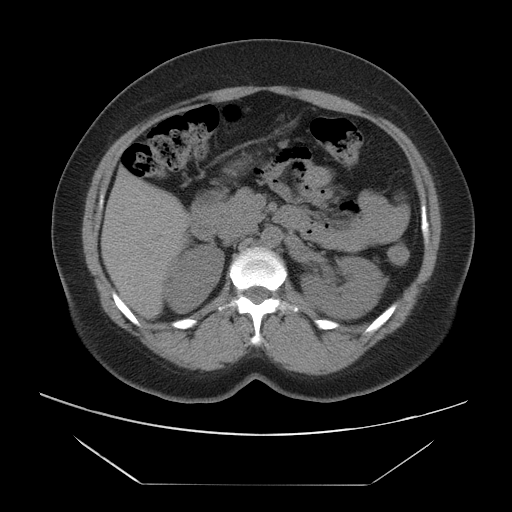
[im 54/81  lung]
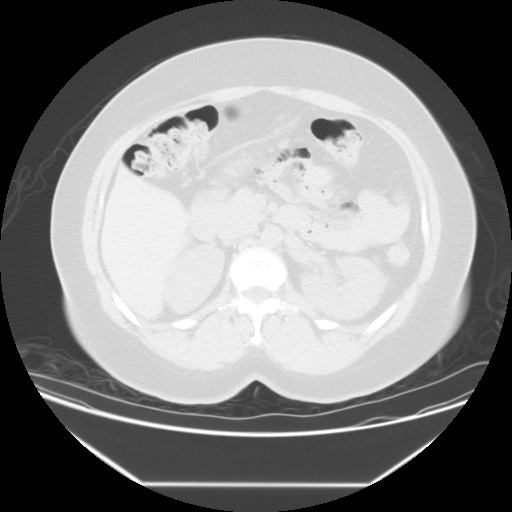
[im 67/81  soft-tissue]
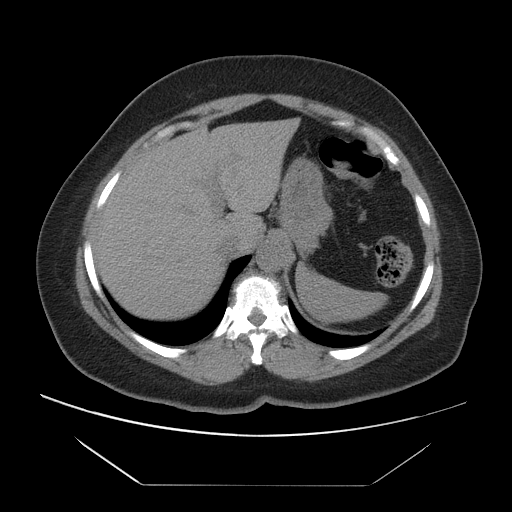
[im 67/81  lung]
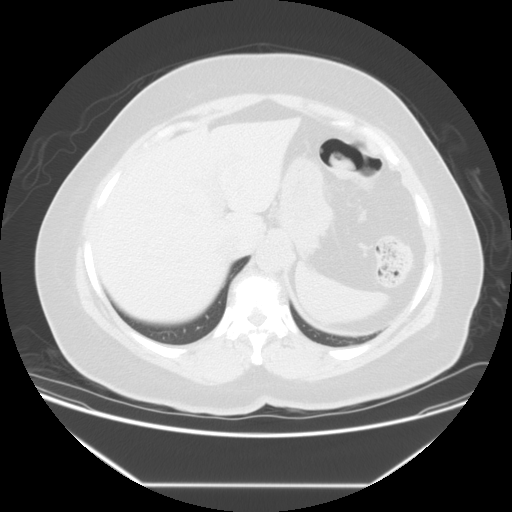

[Series 301: reformatted · sagittal · 0.82mm/px · 8 of 154 slices shown]
[im 12/154  soft-tissue]
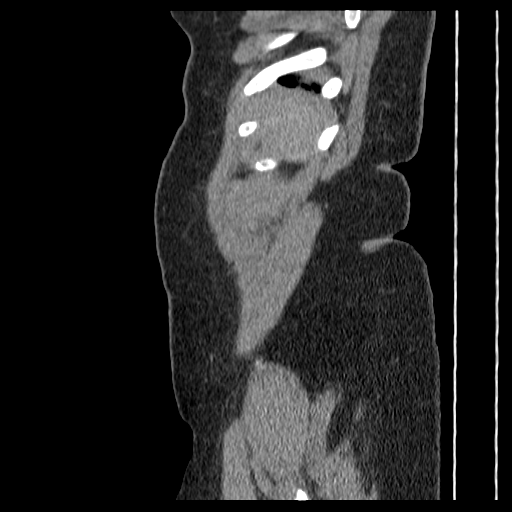
[im 36/154  soft-tissue]
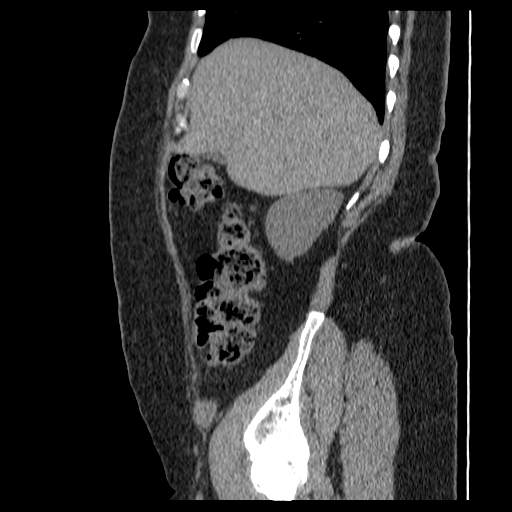
[im 48/154  soft-tissue]
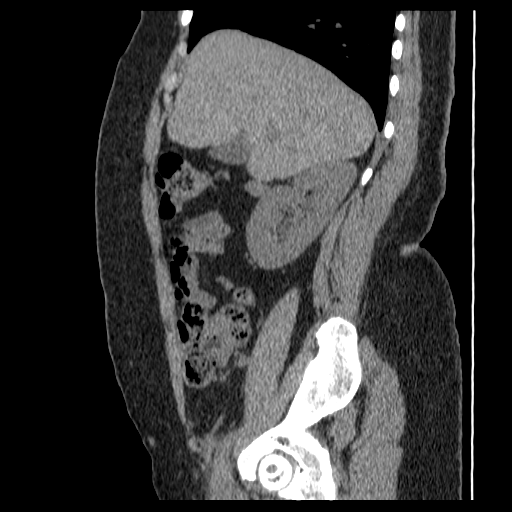
[im 71/154  soft-tissue]
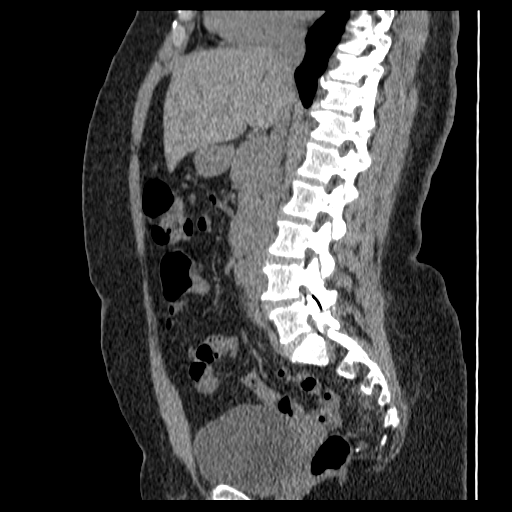
[im 83/154  soft-tissue]
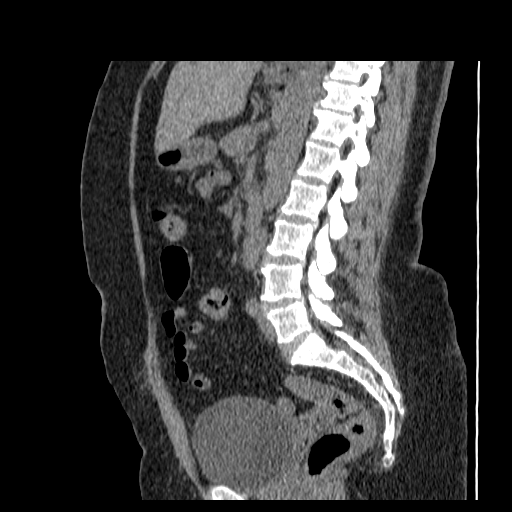
[im 106/154  soft-tissue]
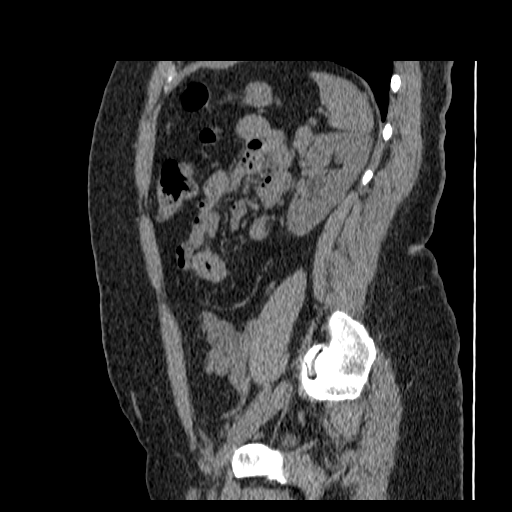
[im 118/154  soft-tissue]
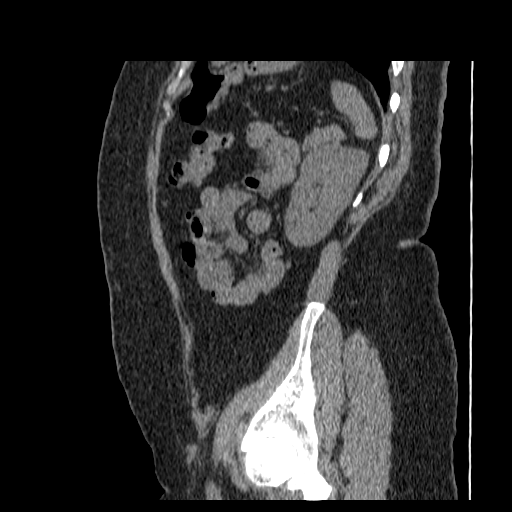
[im 142/154  soft-tissue]
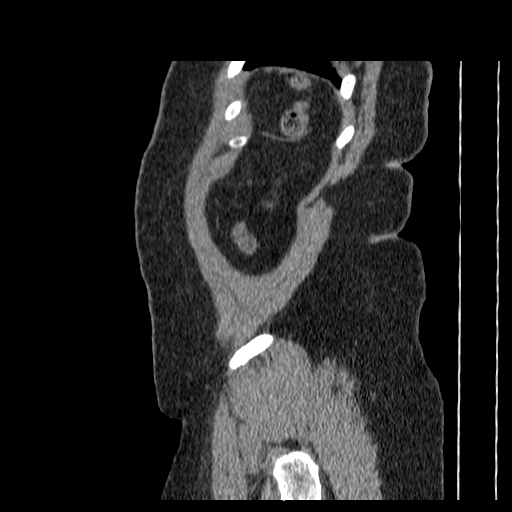

[13 of 32 positions shown; findings below may reference images not displayed]

FINDINGS: Tiny low density lesion in the left hepatic lobe is too small to
characterize on image 9. Liver and spleen have otherwise normal uninfused
features. The stomach is decompressed. Duodenum, pancreas, gallbladder, and left
adrenal gland are unremarkable. Right adrenal nodule measures 1.9 cm in
diameter. Average attenuation of this lesion is 23 Hounsfield units which is too
high to allow definitive characterization as an adenoma. Comparing back to a CT
scan from 07/02/2004, there has been no interval change.

The kidneys are unremarkable. There are 2 phleboliths in close proximity to the
proximal left ureter, but these are stable since the CT scan from 07/02/2004.

No intraperitoneal free fluid. No lymphadenopathy. There is no abdominal aortic
aneurysm.
IMPRESSION: No acute findings.

1.9 cm right adrenal nodule has been stable for 18 months. It may be a lipid
poor adenoma. Dedicated adrenal MRI, if there is no contraindication, may
definitively characterize this nodule. Alternatively, followup CT scan without
contrast in 6 to 12 months could be used to confirm greater than 2 years of
stability.

 PELVIS CT WITHOUT CONTRAST:
FINDINGS: Urinary bladder is distended. The patient is status post hysterectomy.
No evidence for adnexal mass. The appendix is normal. The terminal ileum is
unremarkable.

No distal ureteral or bladder stones identified. No evidence for diverticulitis.
IMPRESSION: Stable exam. No acute findings.

## 2008-06-23 LAB — PROTIME-INR
INR: 3.9 — ABNORMAL HIGH (ref 2.00–3.50)
Protime: 46.8 Seconds — ABNORMAL HIGH (ref 10.6–13.4)

## 2008-06-25 IMAGING — CR DG CHEST 1V PORT
1 series · 1 of 1 positions shown · non-contrast
Comparison: none

CLINICAL DATA: Chest pain. 
 PORTABLE CHEST - 1 VIEW:

[view not recorded]
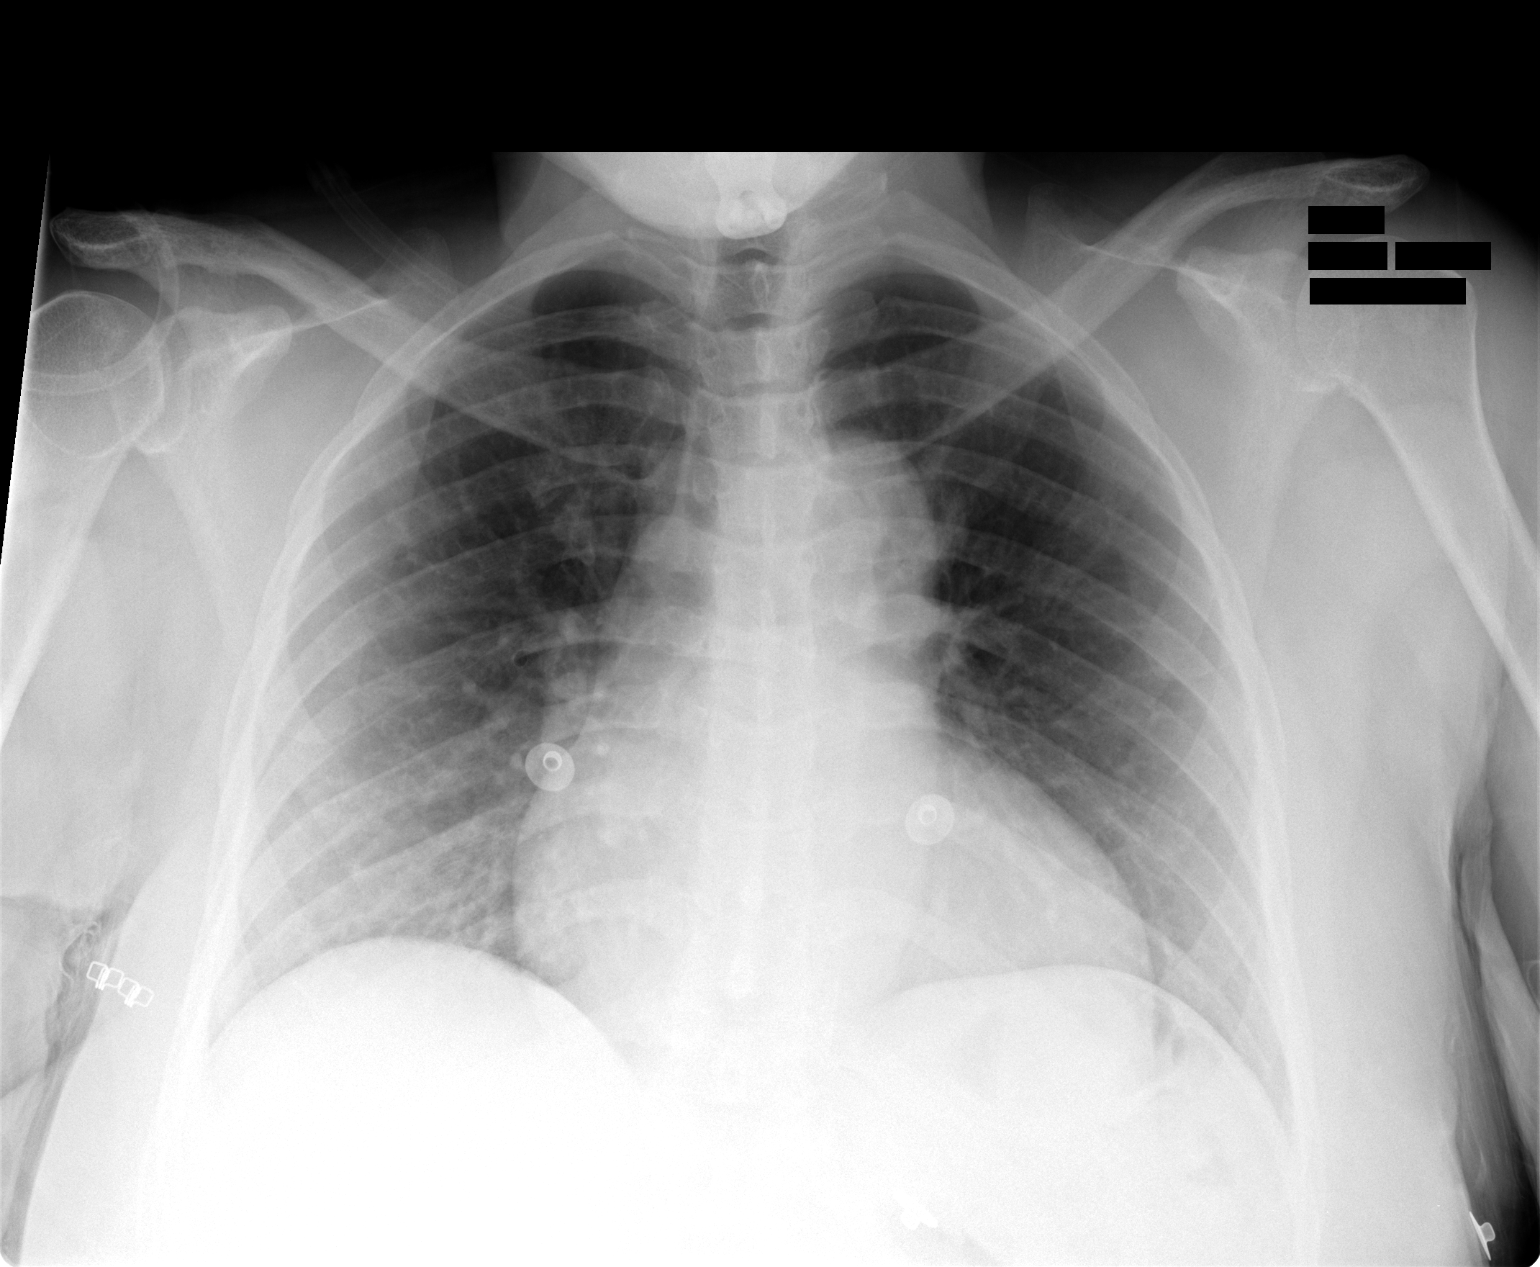

[1 of 1 positions shown; findings below may reference images not displayed]

FINDINGS: There is cardiomegaly without edema.  No effusion or focal airspace disease.
IMPRESSION: Cardiomegaly without acute process.

## 2008-06-25 IMAGING — CR DG LUMBAR SPINE COMPLETE 4+V
5 series · 5 of 5 positions shown · non-contrast
Comparison: none

CLINICAL DATA: Pain radiating down the right leg.  
 LUMBAR SPINE ? 4 VIEW:

[t l-spine a.p.]
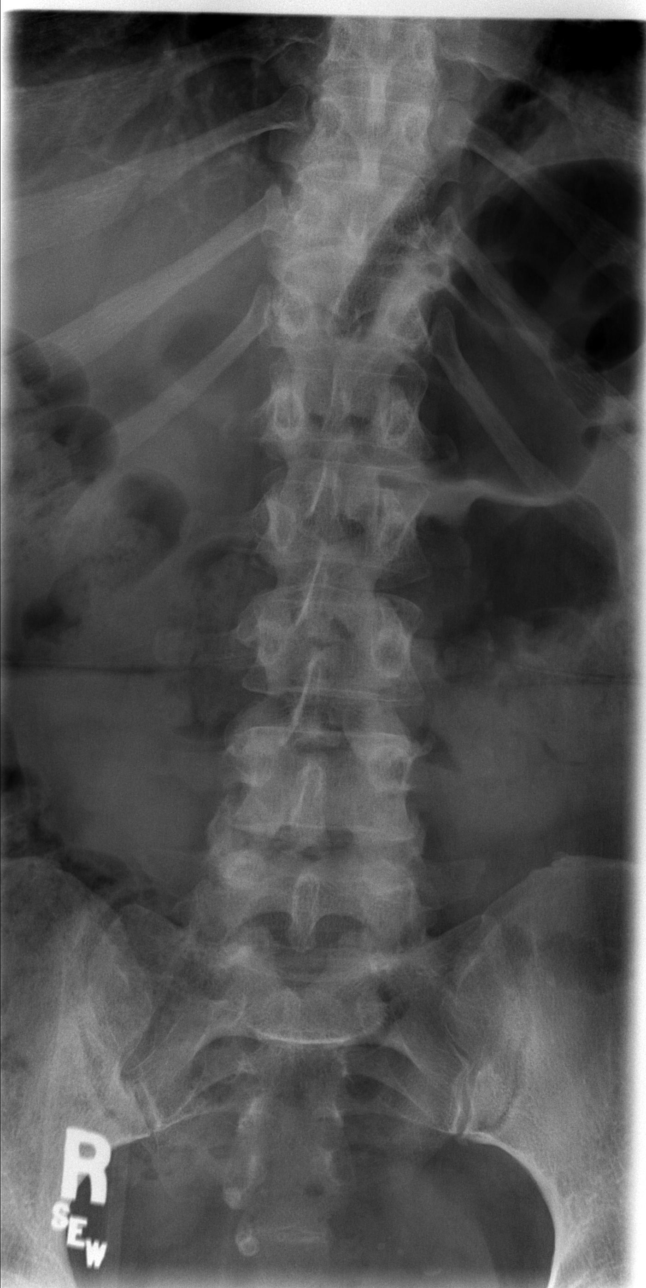

[t l-spine oblique exposure (1 of 2)]
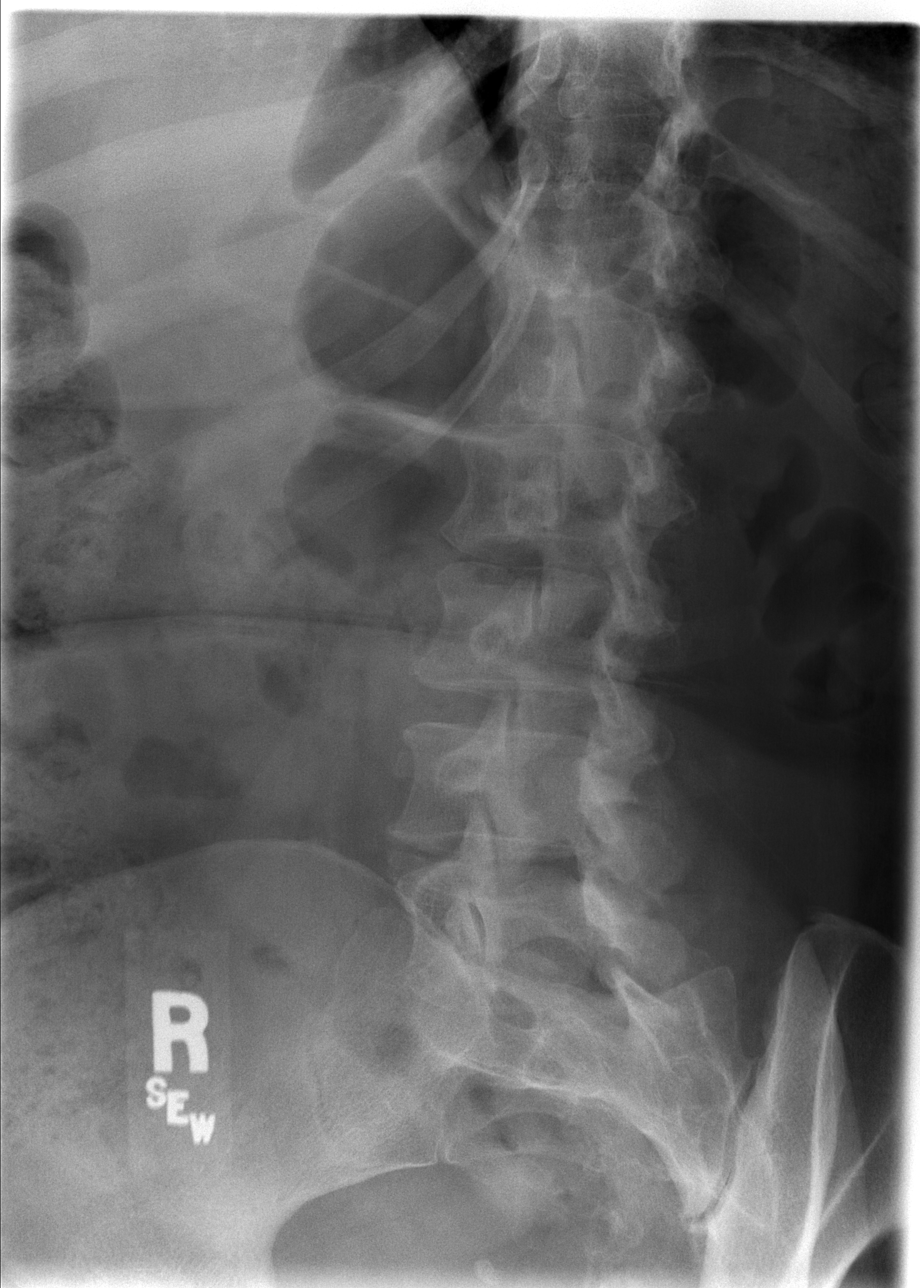

[t l-spine oblique exposure (2 of 2)]
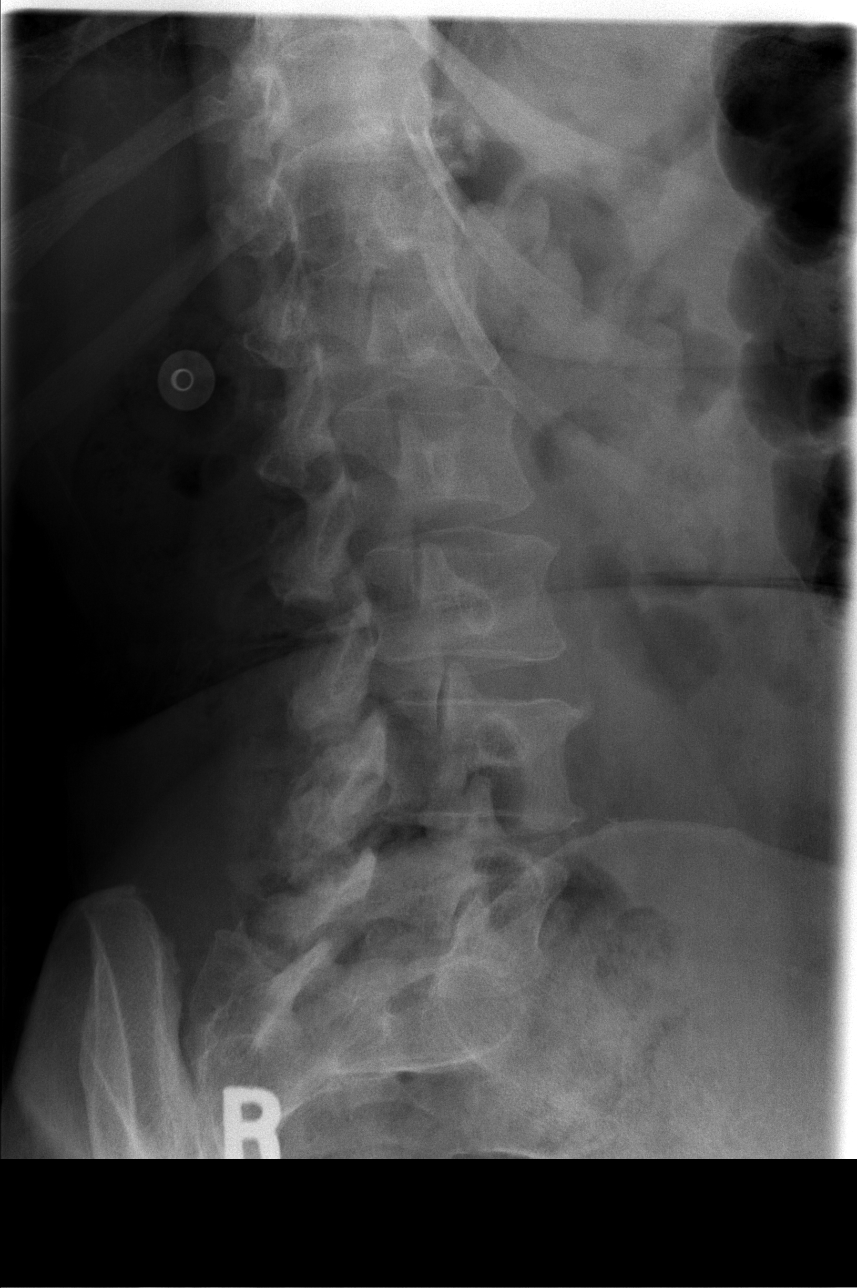

[t l-spine lat]
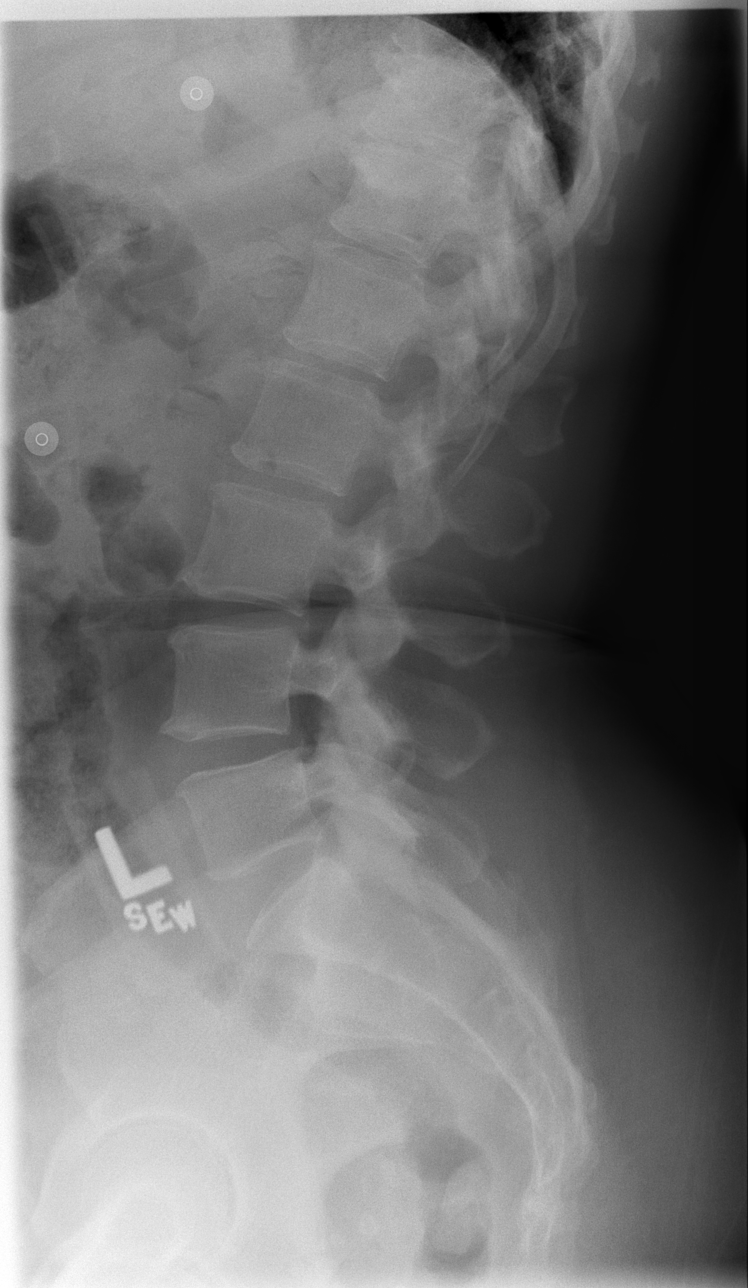

[t l-spine l5-s1 spot]
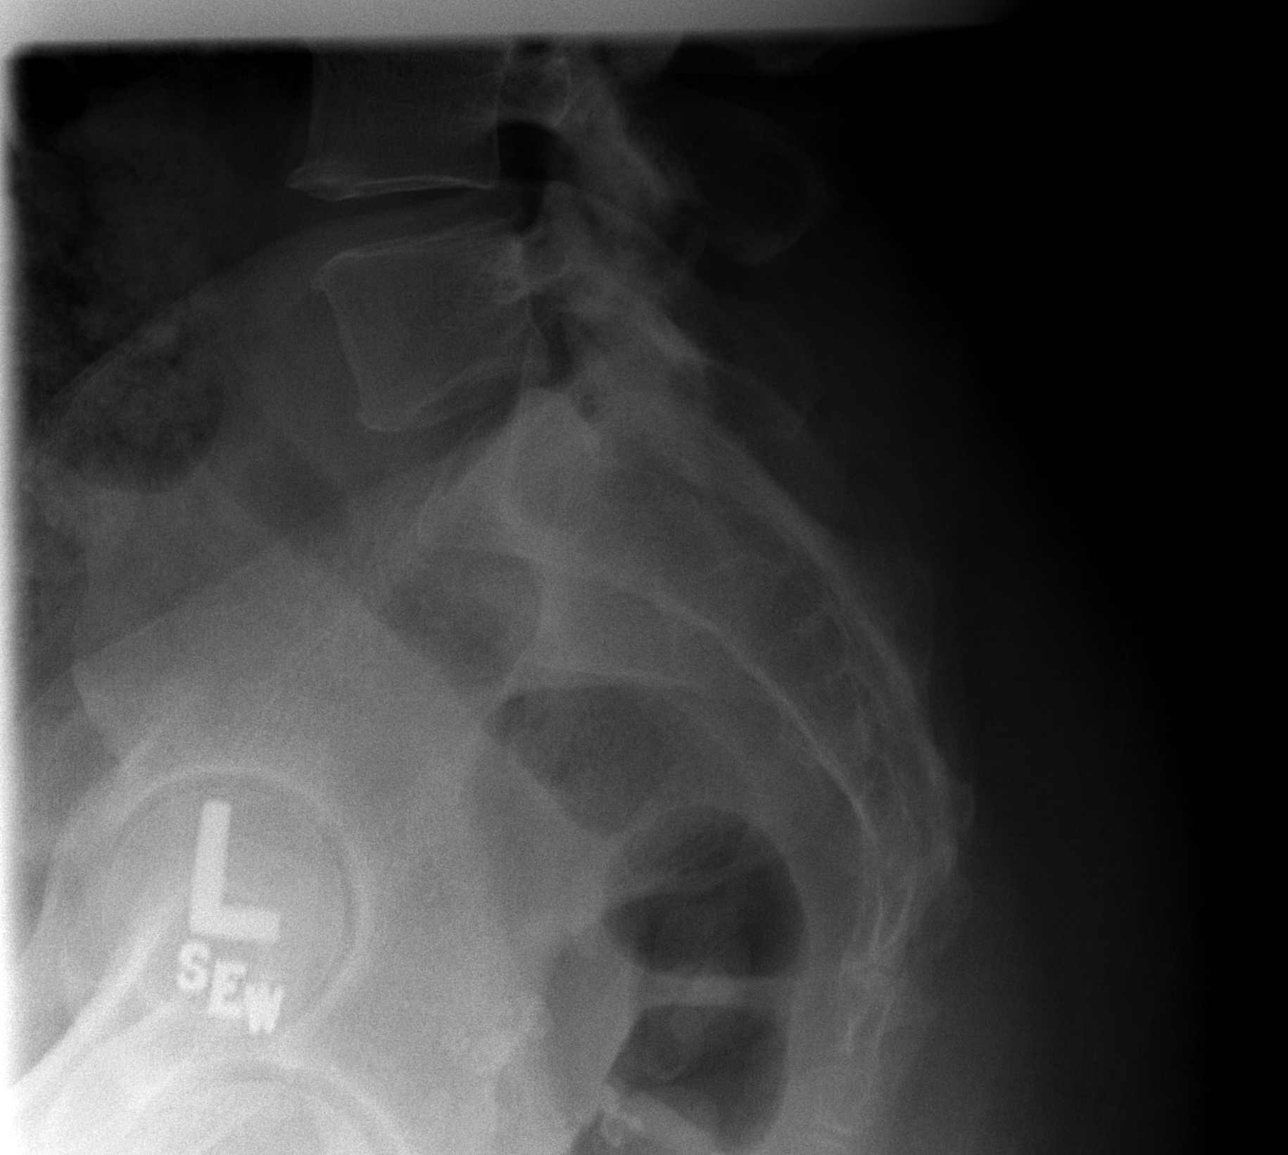

[5 of 5 positions shown; findings below may reference images not displayed]

FINDINGS: Vertebral height and alignment are maintained.  There is some end plate spurring and loss of disc space height most notable at L3-4 and to a lesser degree at L4-5.  Facet arthropathy lower lumbar spine is noted.
IMPRESSION: No acute findings with degenerative disc disease most notable at L3-4 with facet degenerative change lower lumbar spine noted.

## 2008-06-26 IMAGING — MR MR LUMBAR SPINE W/O CM
4 of 7 series · 18 of 48 positions shown · IV contrast (agent unspecified)
Comparison: Lumbar plain films 01/02/06.

CLINICAL DATA: 50-year-old with low back and right leg pain.
MRI LUMBAR SPINE WITHOUT CONTRAST:
TECHNIQUE: Multiplanar and multiecho pulse sequences of the lumbar spine, to include the lower thoracic and upper sacral regions, were obtained according to standard protocol without IV contrast.

[Series 2: T1 · sagittal · 4.0mm · 0.59mm/px · 3 of 12 slices shown (1 of 2)]
[im 1/12]
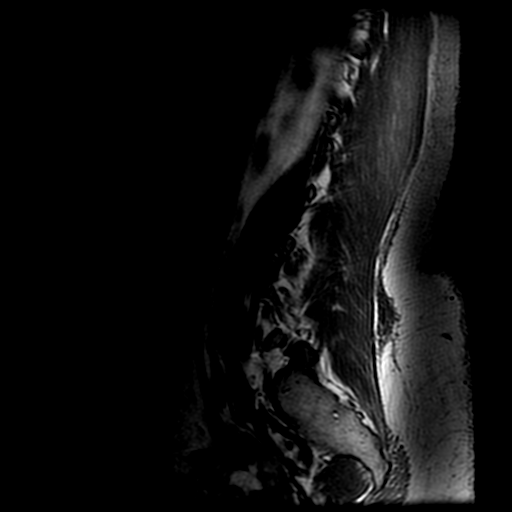
[im 6/12]
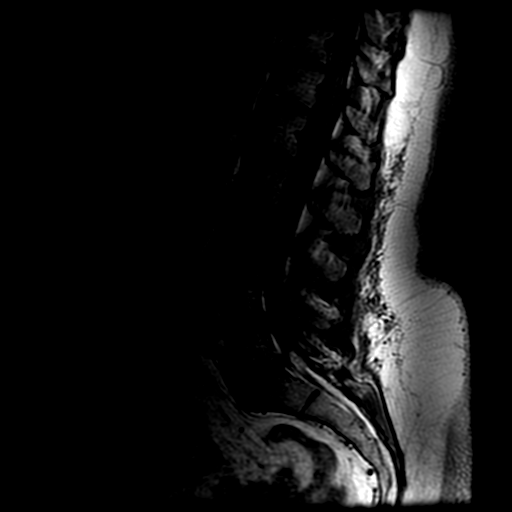
[im 12/12]
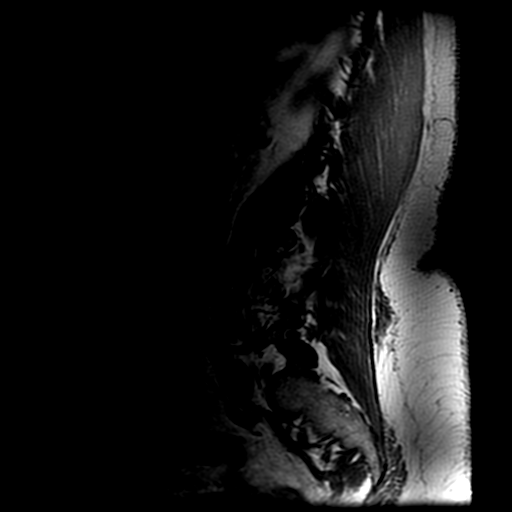

[Series 4: T2 · sagittal · 4.0mm · 0.59mm/px · 4 of 12 slices shown (1 of 2)]
[im 1/12]
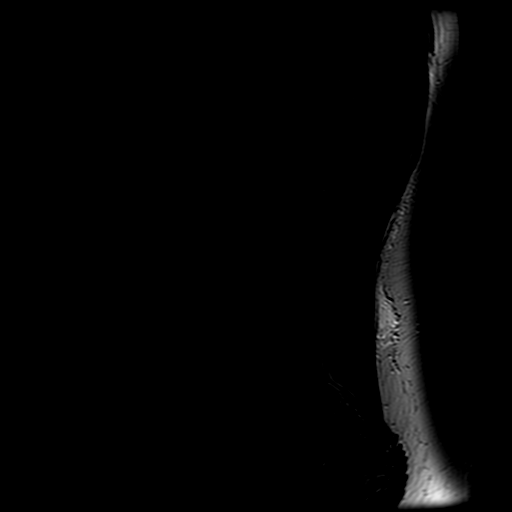
[im 4/12]
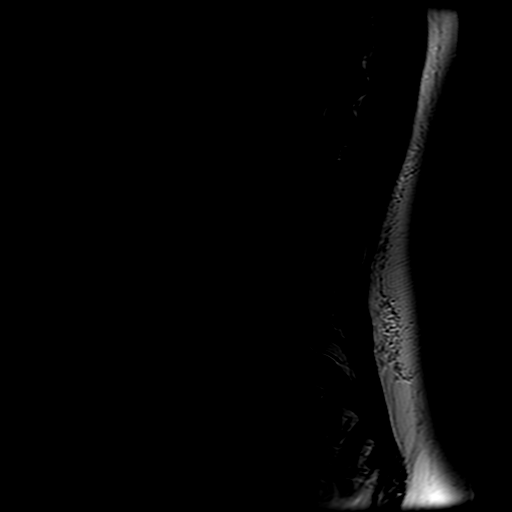
[im 8/12]
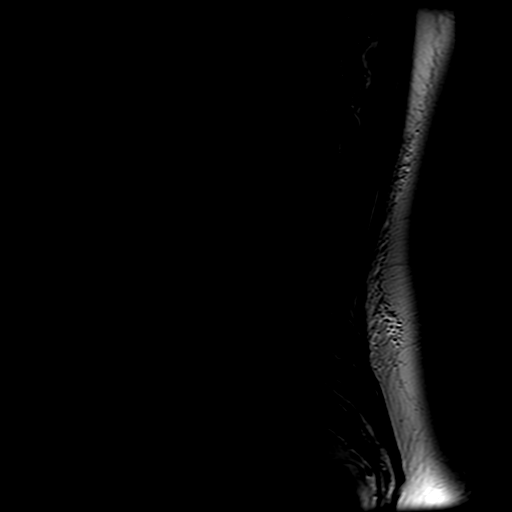
[im 12/12]
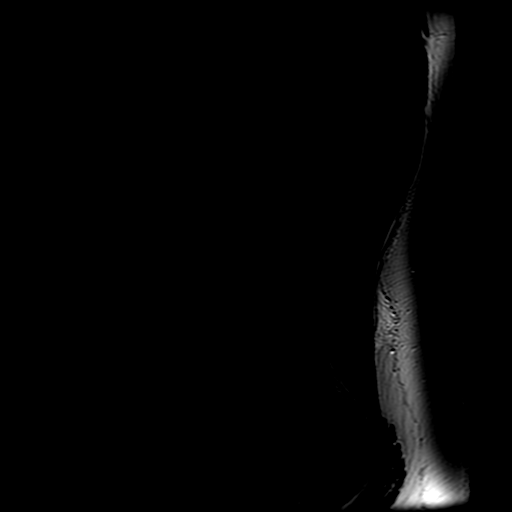

[Series 6: T2 · axial · 4.0mm · 0.35mm/px · z∈[-65,+173]mm · 8 of 24 slices shown (2 of 2)]
[im 1/24]
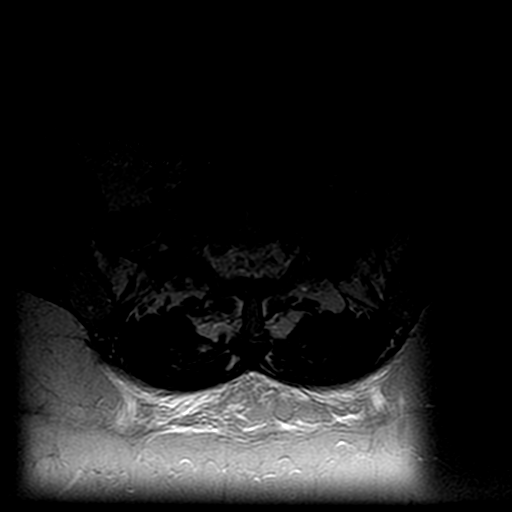
[im 3/24]
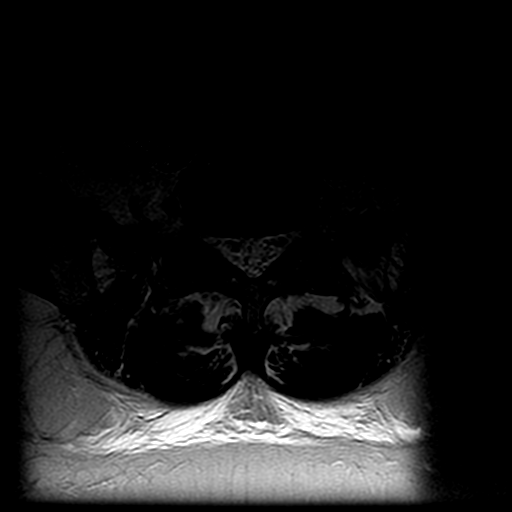
[im 6/24]
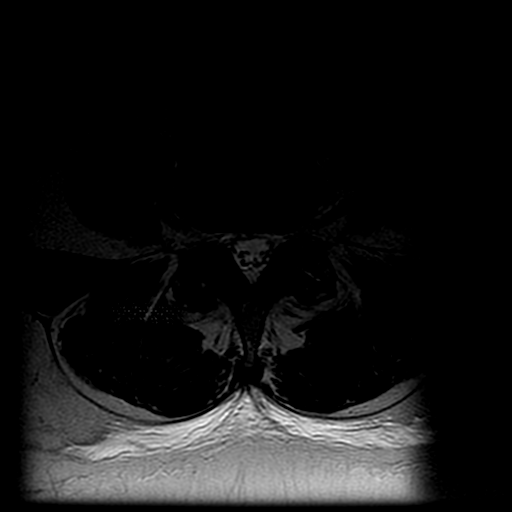
[im 9/24]
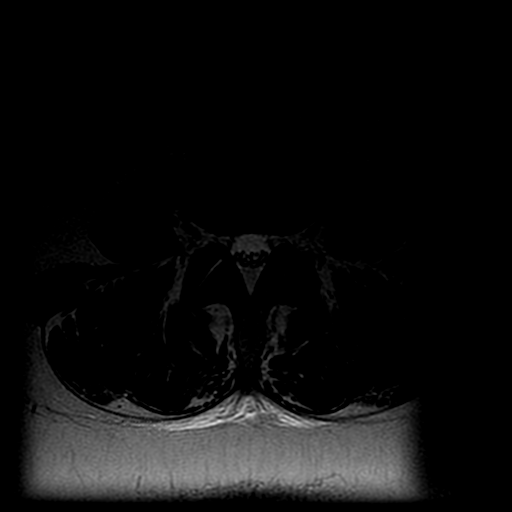
[im 12/24]
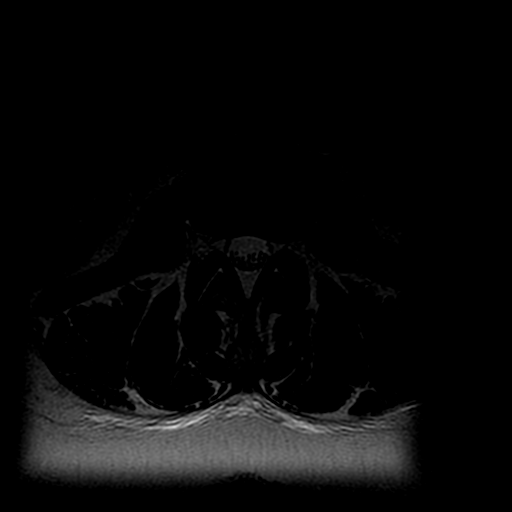
[im 15/24]
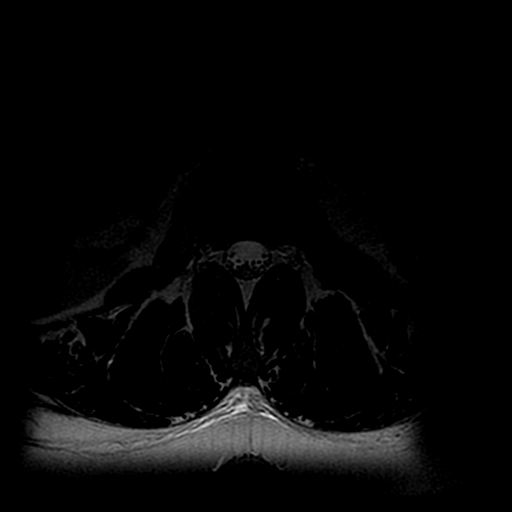
[im 18/24]
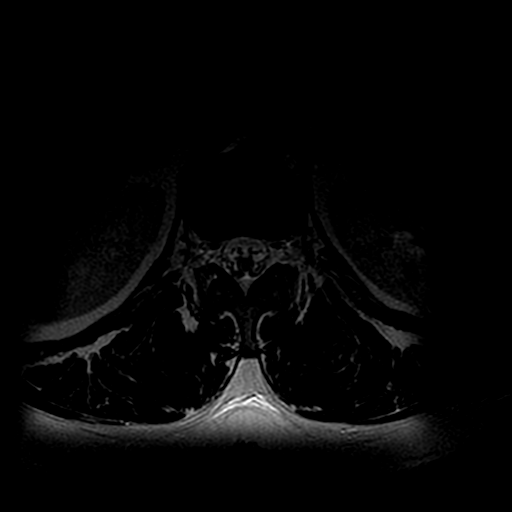
[im 21/24]
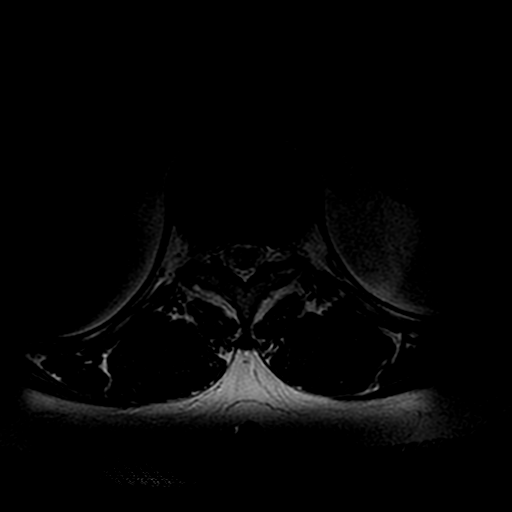

[Series 7: T1 · axial · 4.0mm · 0.35mm/px · z∈[-56,+173]mm · 3 of 24 slices shown (2 of 2)]
[im 3/24]
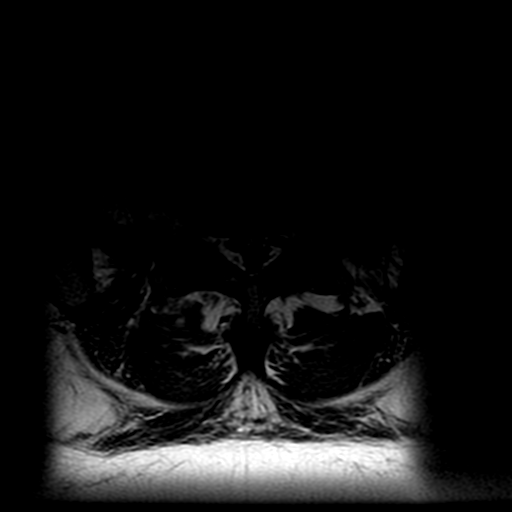
[im 12/24]
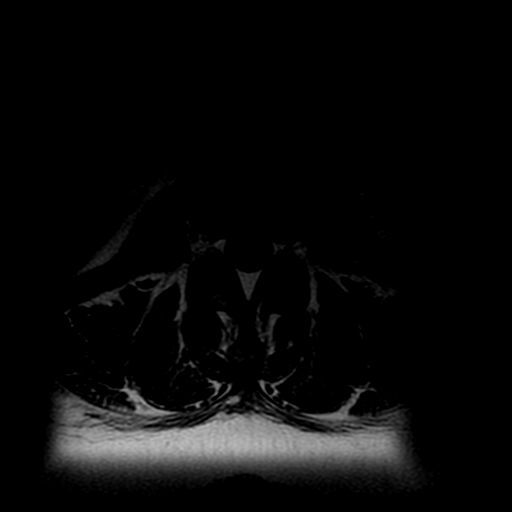
[im 21/24]
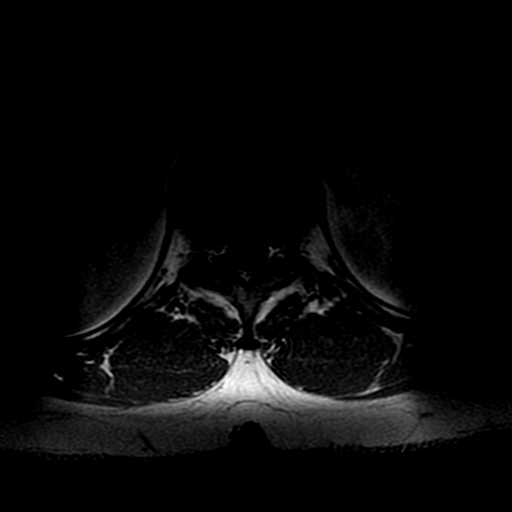

[18 of 48 positions shown; findings below may reference images not displayed]

FINDINGS: Sagittal MR images demonstrate normal overall alignment of the lumbar vertebral bodies.  They demonstrate normal marrow signal.  The conus medullaris terminates at L1.  Degenerative changes are noted in the lower thoracic spine.  Degenerative disc disease.  
L1-2:  No significant findings. 
L2-3:  Shallow left paracentral and foraminal disc protrusion, but no definite direct neural compression.  The exiting left L2 nerve root could be irritated.  Mild facet disease.  No significant spinal or lateral recess stenosis.
L3-4:  Hypertrophic facet disease and ligamentum flavum thickening, but no spinal, lateral recess, or foraminal stenosis.  No focal disc protrusion.
L4-5:  Moderately severe facet disease.  Diffuse bulging annulus.  Shallow right foraminal disc protrusion without direct neural compression.  
L5-S1:  Moderately severe facet disease.  No focal disc protrusion, spinal or foraminal stenosis.  There is a small cyst in the lateral neural foramen on the right side which is likely a small synovial cyst emanating from the right facet joint.  No definite compression of the exiting right L5 nerve root.
IMPRESSION: 1.  Shallow left paracentral and foraminal disc protrusion at L2-3 without definite direct neural compression.
2.  Very shallow right foraminal disc protrusion at L4-5 without definite direct neural compression.  This possibly could irritate the right L4 nerve root.
3.  A small synovial cyst on the right at L5-S1, but no direct compression of the right L5 nerve root.
4.  Advanced hypertrophic facet disease in the spine without pars defects.

## 2008-06-26 IMAGING — CR DG WRIST COMPLETE 3+V*R*
2 series · 2 of 2 positions shown · non-contrast
Comparison: none

CLINICAL DATA: pain without trauma

Right wrist 4 view:
No previous available for comparison. There is no evidence of fracture.  Normal
alignment. There is no evidence of arthropathy or other focal bone abnormality. 
Soft tissues are unremarkable.

[view not recorded (1 of 2)]
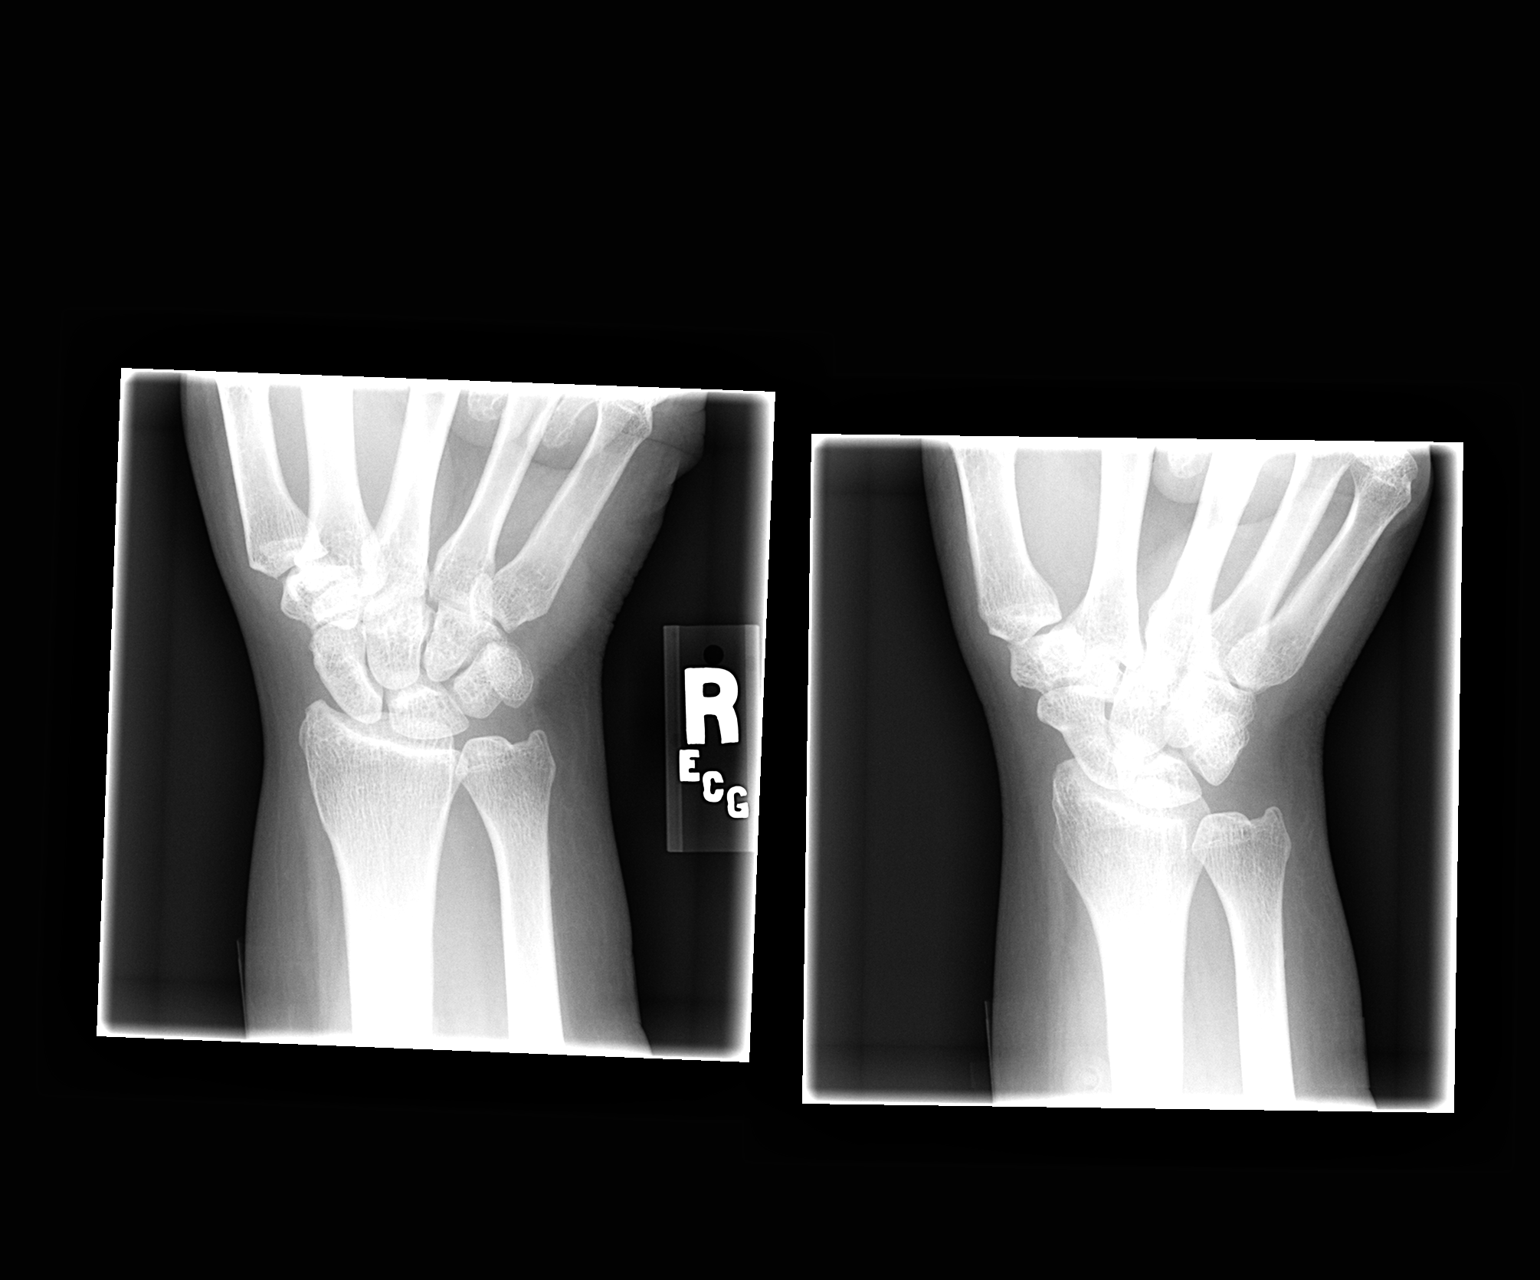

[view not recorded (2 of 2)]
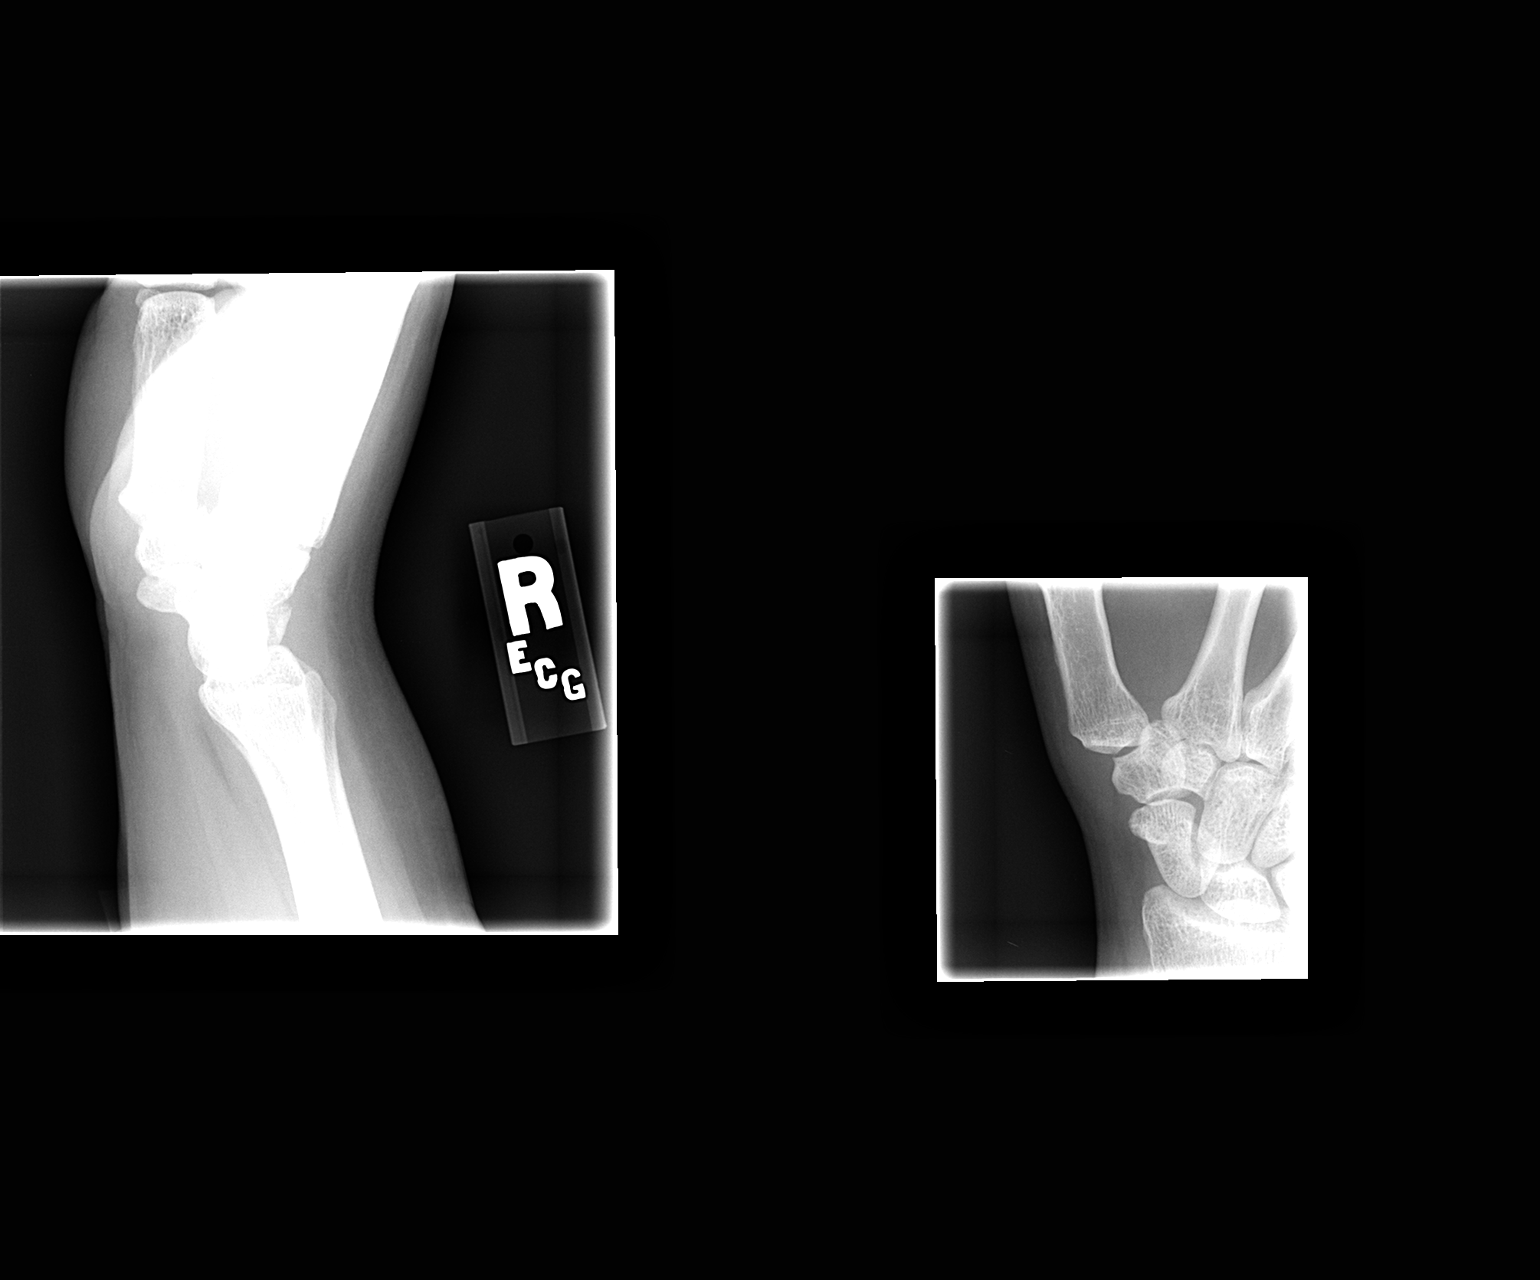

[2 of 2 positions shown; findings below may reference images not displayed]

IMPRESSION: Negative.

## 2008-06-30 LAB — PROTIME-INR
INR: 2 (ref 2.00–3.50)
Protime: 24 Seconds — ABNORMAL HIGH (ref 10.6–13.4)

## 2008-07-02 IMAGING — US US BIOPSY
1 series · 11 of 11 positions shown · non-contrast
Comparison: none

CLINICAL DATA: 12 mm right thyroid nodule seen on previous CT scan.  Request has been made for fine needle aspiration.
 ULTRASOUND GUIDED THYROID BIOPSY:
 Consent:  Procedure in detail was discussed with the patient and her questions were answered.  Potential complications including the risk of infection, bleeding, injury to adjacent vessels and nerves were discussed with the patient with apparent understand, and written consent was obtained. 
 Procedure:  Ultrasound was used to mark appropriate skin site.  The patient was prepped and draped in the normal sterile fashion, and 1% lidocaine was used for local anesthesia.  Using direct ultrasound guidance, three passes were made into the nodule of the right thyroid with 25-gauge hypodermic needles.  The specimens were sent to the laboratory for further analysis.  Post imaging of the thyroid revealed no evidence of bleeding or hematoma.  The patient appeared to tolerate the procedure well with no immediate complications.

[Series 1: unknown · 0.07mm/px · 11 of 11 slices shown]
[im 1/11]
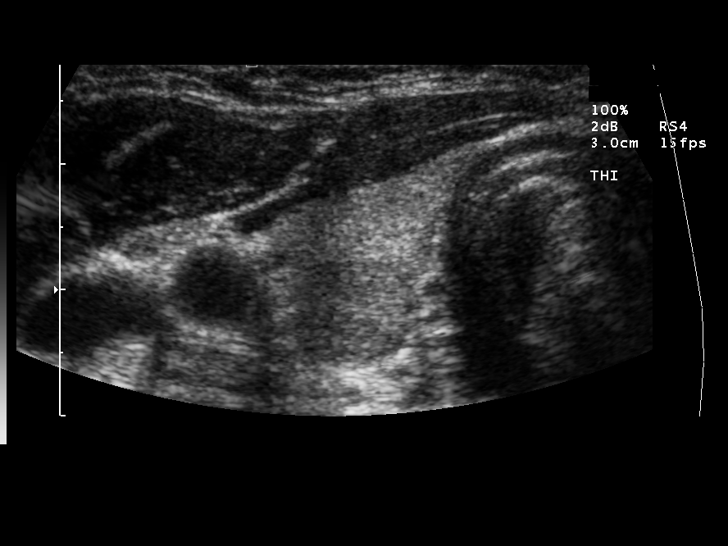
[im 2/11]
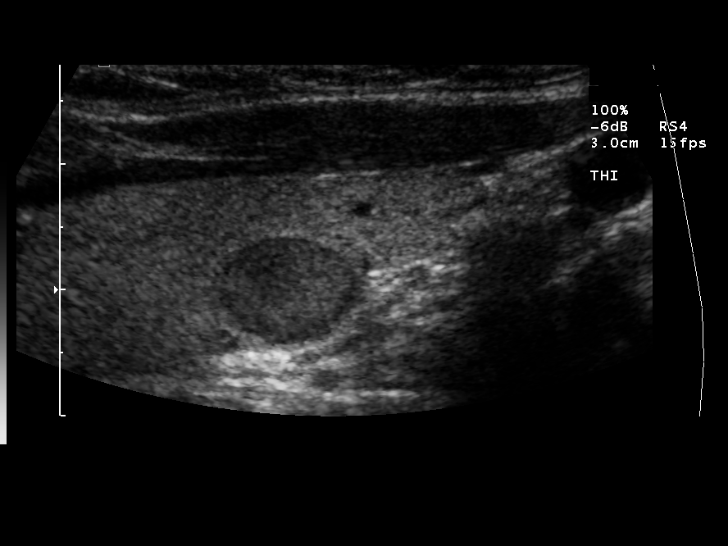
[im 3/11]
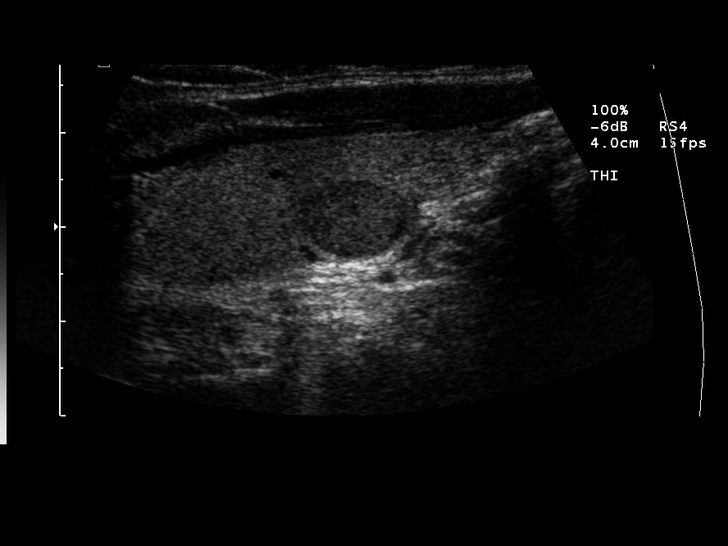
[im 4/11]
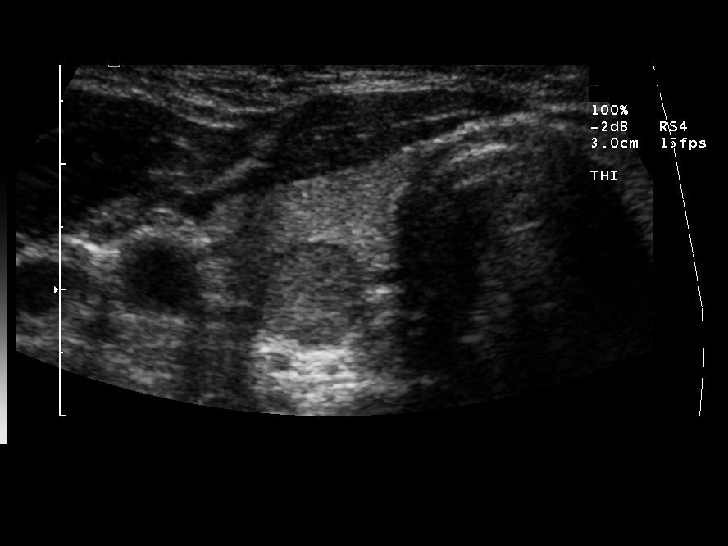
[im 5/11]
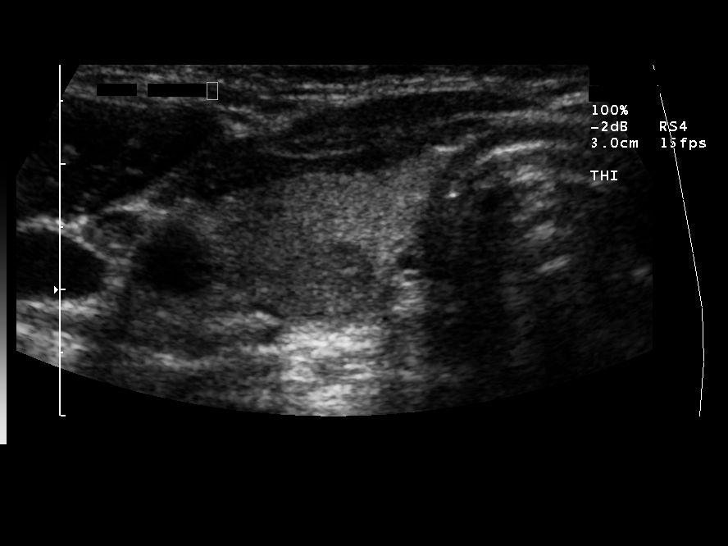
[im 6/11]
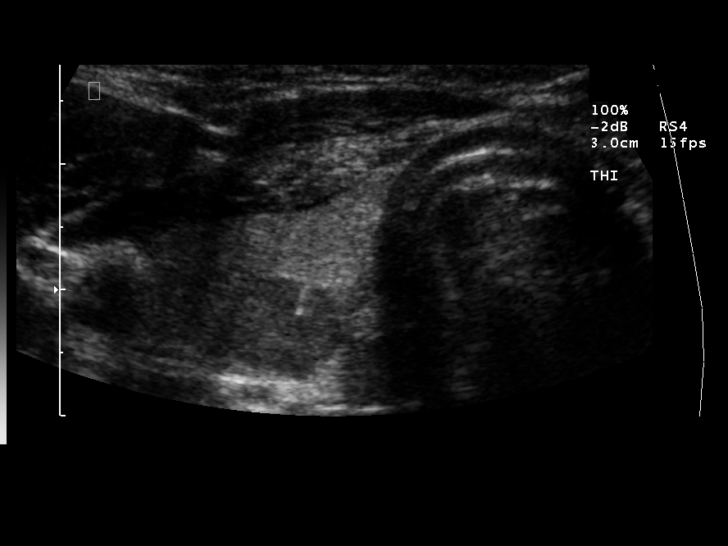
[im 7/11]
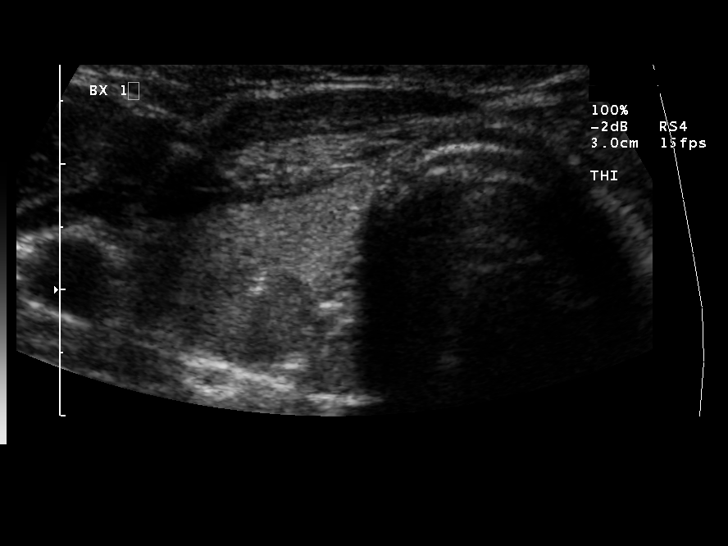
[im 8/11]
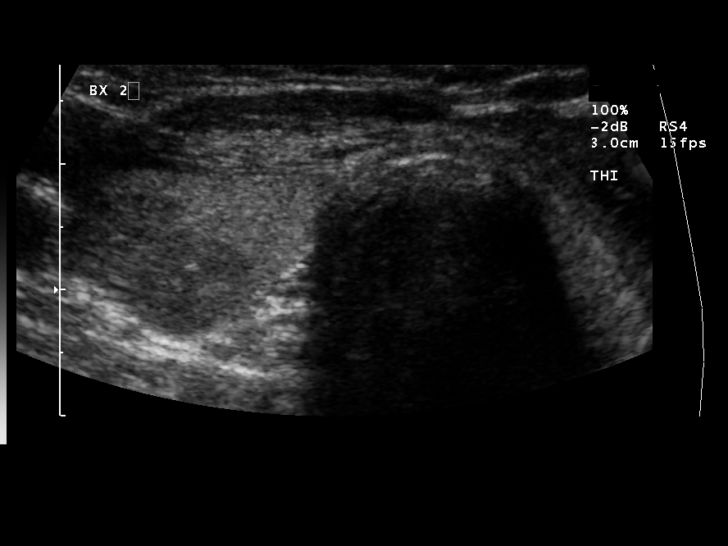
[im 9/11]
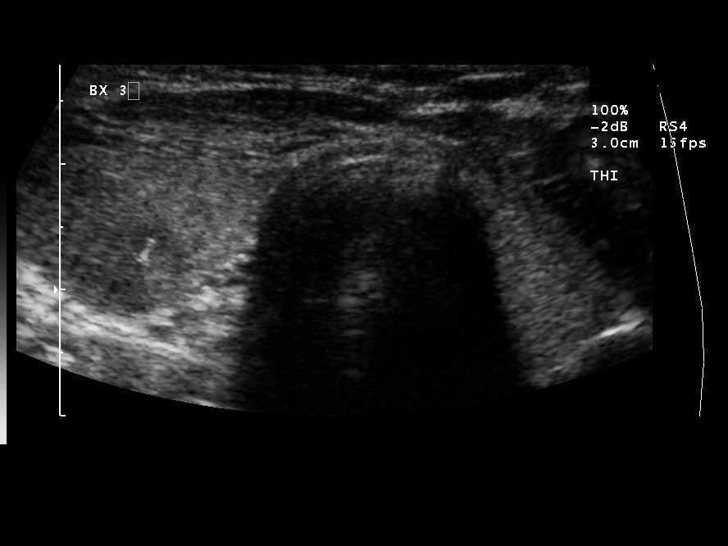
[im 10/11]
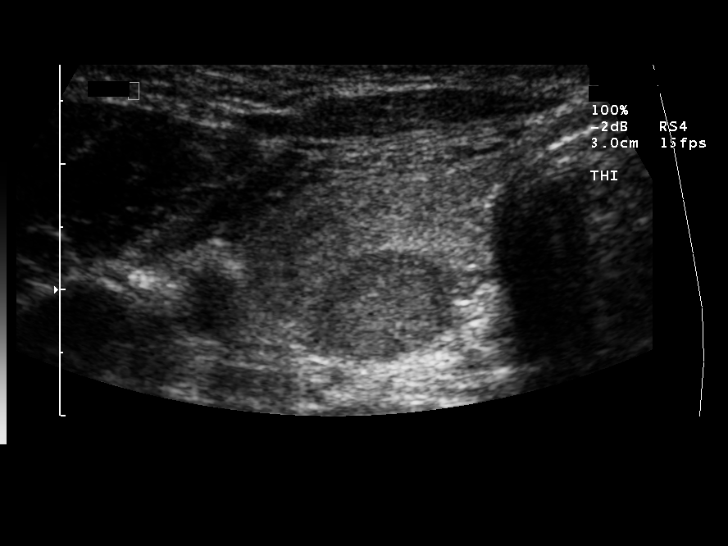
[im 11/11]
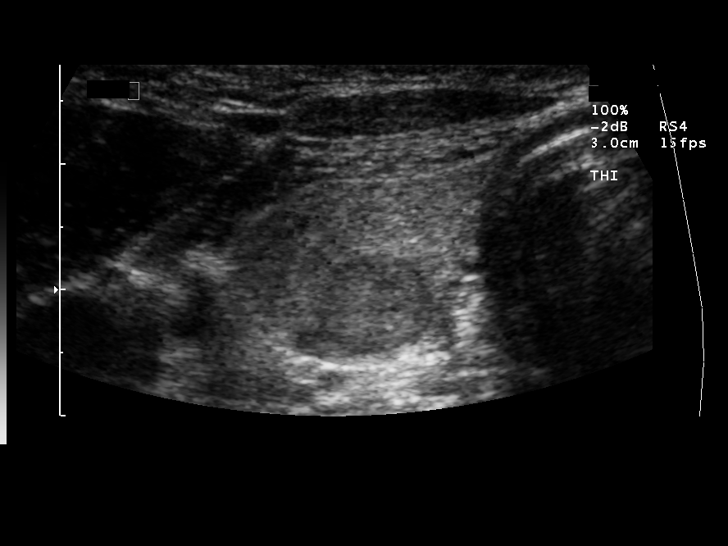

[11 of 11 positions shown; findings below may reference images not displayed]

IMPRESSION: Successful fine needle aspiration of right thyroid nodule.

## 2008-07-10 ENCOUNTER — Ambulatory Visit: Payer: Self-pay | Admitting: Oncology

## 2008-07-15 ENCOUNTER — Emergency Department (HOSPITAL_COMMUNITY): Admission: EM | Admit: 2008-07-15 | Discharge: 2008-07-16 | Payer: Self-pay | Admitting: Emergency Medicine

## 2008-07-27 LAB — PROTIME-INR
INR: 2.3 (ref 2.00–3.50)
Protime: 27.6 Seconds — ABNORMAL HIGH (ref 10.6–13.4)

## 2008-09-01 ENCOUNTER — Ambulatory Visit: Payer: Self-pay | Admitting: Oncology

## 2008-09-03 LAB — PROTIME-INR
INR: 1.1 — ABNORMAL LOW (ref 2.00–3.50)
Protime: 13.2 Seconds (ref 10.6–13.4)

## 2008-09-16 LAB — PROTIME-INR
INR: 1.7 — ABNORMAL LOW (ref 2.00–3.50)
Protime: 20.4 Seconds — ABNORMAL HIGH (ref 10.6–13.4)

## 2008-09-28 ENCOUNTER — Ambulatory Visit: Payer: Self-pay | Admitting: Oncology

## 2008-10-07 LAB — PROTIME-INR
INR: 1.4 — ABNORMAL LOW (ref 2.00–3.50)
Protime: 16.8 Seconds — ABNORMAL HIGH (ref 10.6–13.4)

## 2008-10-13 LAB — PROTIME-INR
INR: 1.6 — ABNORMAL LOW (ref 2.00–3.50)
Protime: 19.2 Seconds — ABNORMAL HIGH (ref 10.6–13.4)

## 2008-10-22 IMAGING — CR DG KNEE 1-2V*L*
2 series · 2 of 2 positions shown · non-contrast
Comparison: None.

CLINICAL DATA: Leg pain. 
 LEFT KNEE - 2 VIEW:

[t knee ap left]
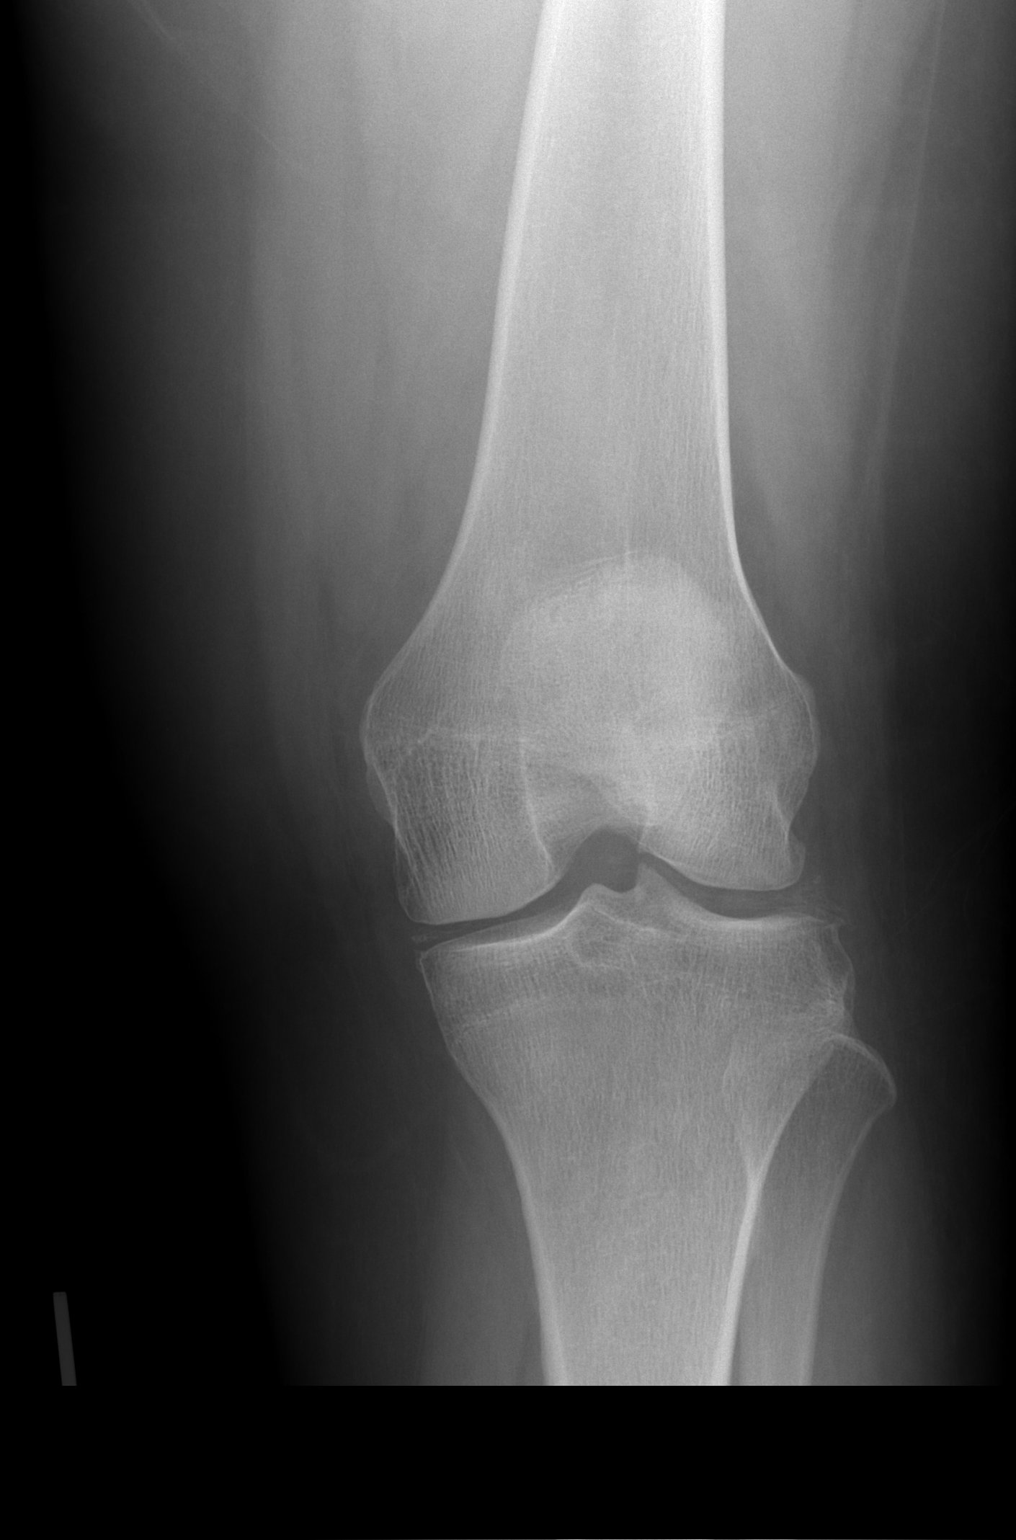

[t knee lat left]
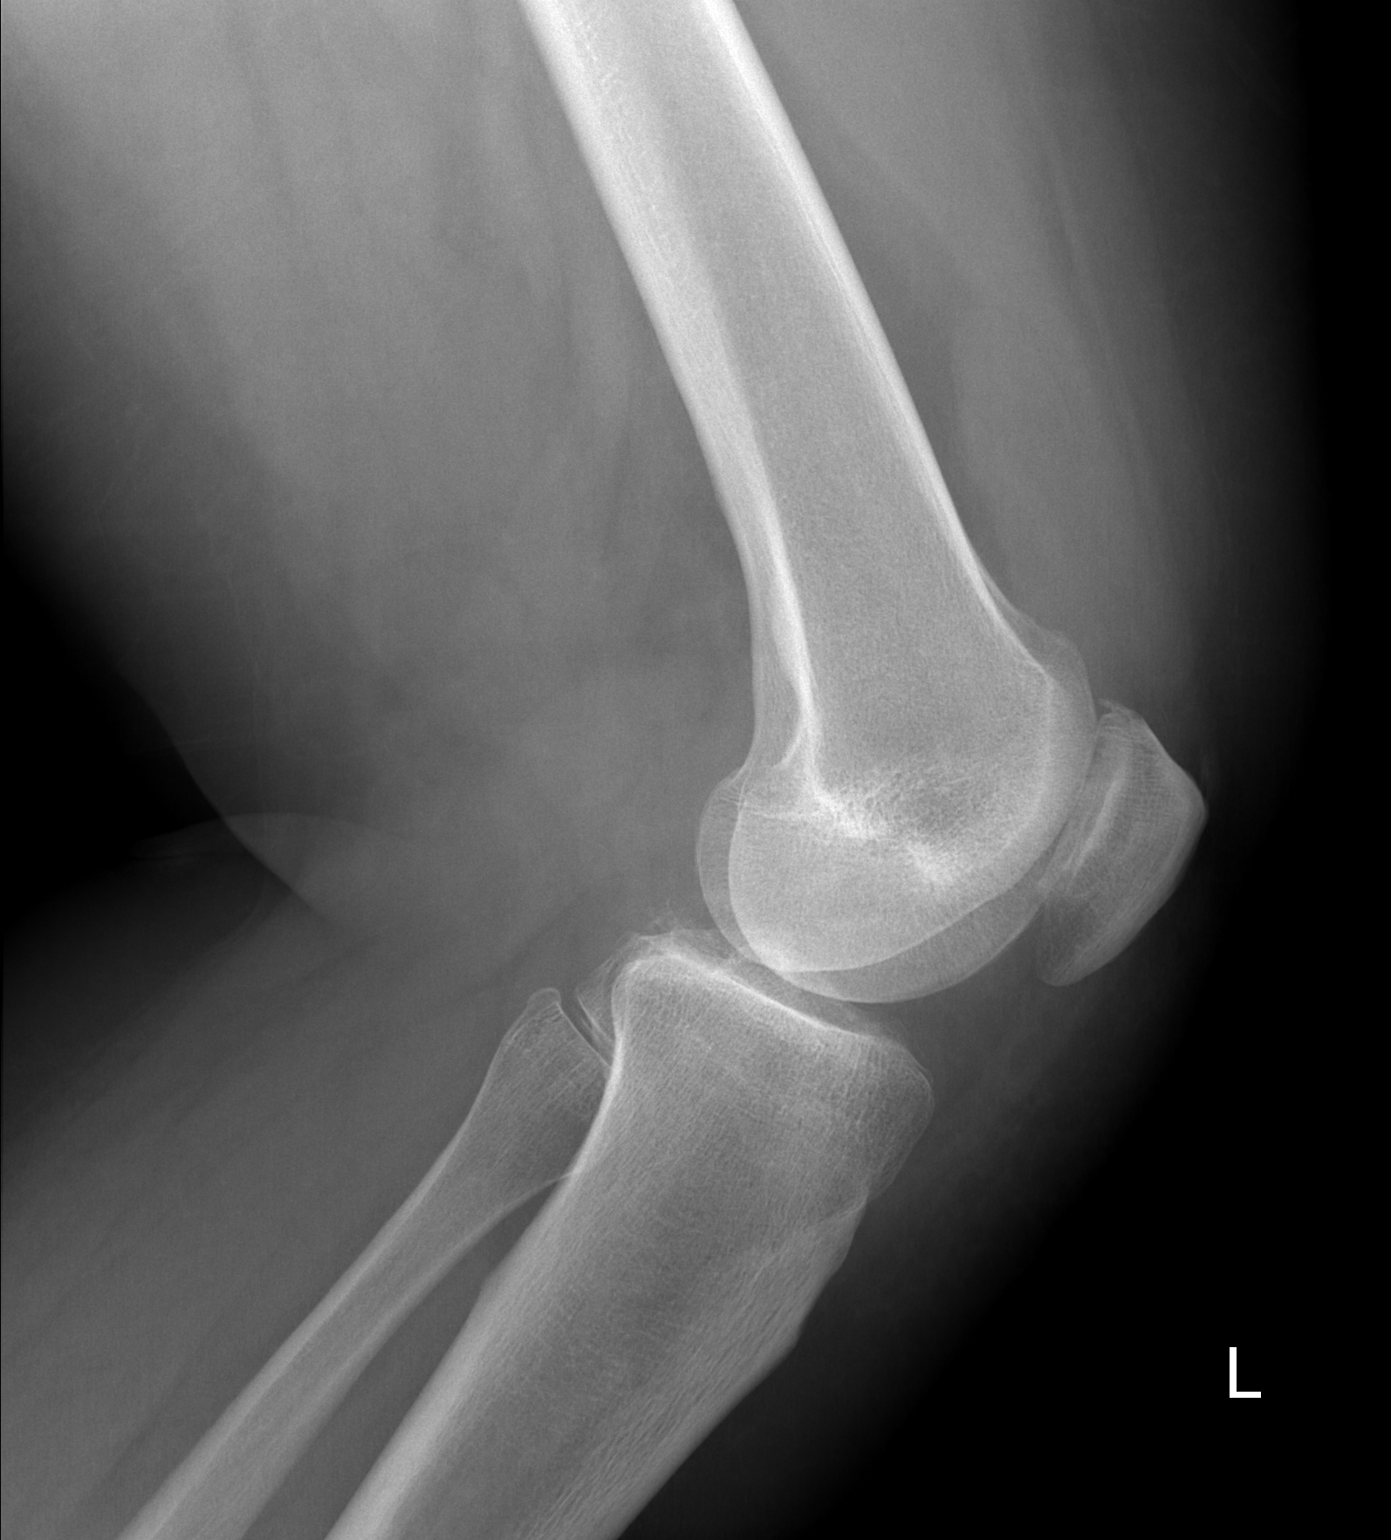

[2 of 2 positions shown; findings below may reference images not displayed]

FINDINGS: There is a large joint effusion.  Quadriceps and patellar tendons are intact.  There is moderate degenerative changes with sharpening of the tibial spines and osteophyte formation.  Chondrocalcinosis is noted.
IMPRESSION: 1.  Large joint effusion. 
 2.  Moderate degenerative joint disease and chondrocalcinosis.

## 2008-10-22 IMAGING — CT CT ANGIO CHEST
3 of 4 series · 18 of 30 positions shown · IV contrast (100 ML OMNI 300)
Comparison: none

CLINICAL DATA: Chest pain and positive DVT in the left leg. Shortness of breath.
CT ANGIOGRAPHY OF CHEST:
TECHNIQUE: Multidetector CT imaging of the chest was performed during bolus injection of intravenous contrast.  Multiplanar CT angiographic image reconstructions were generated to evaluate the vascular anatomy.
Contrast:  100 cc Omnipaque 300

[Series 100: routine chest · axial · 0.70mm/px · z∈[-270,-38]mm · 10 of 242 slices shown (1 of 2)]
[im 25/242  lung]
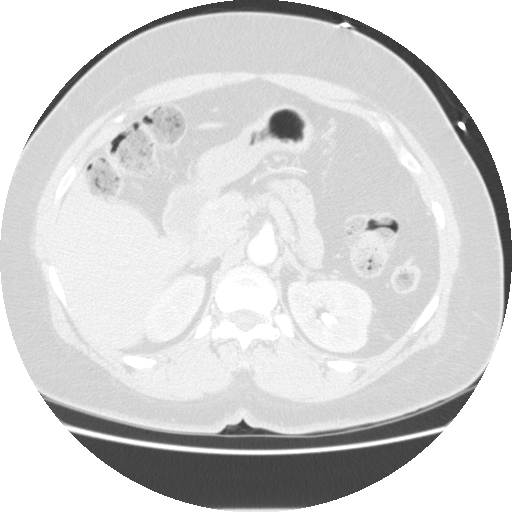
[im 49/242  mediastinal]
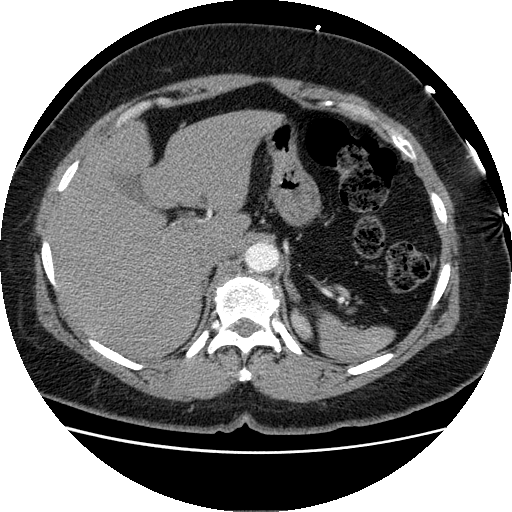
[im 73/242  lung]
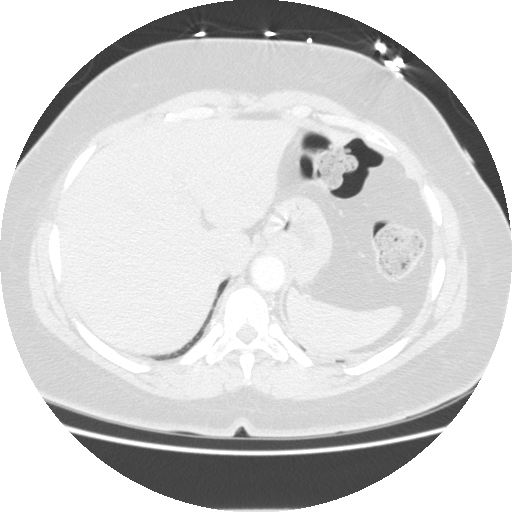
[im 97/242  mediastinal]
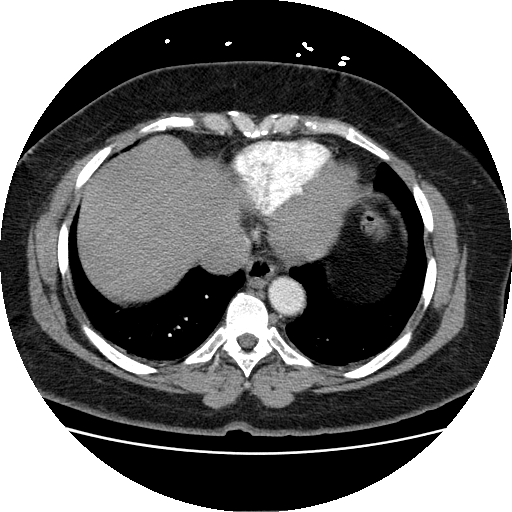
[im 115/242  lung]
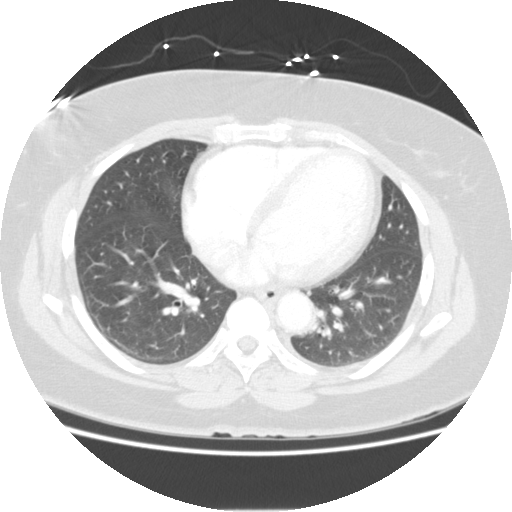
[im 121/242  mediastinal]
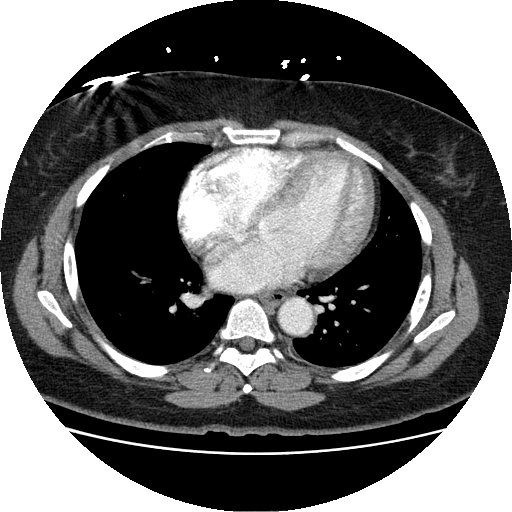
[im 145/242  lung]
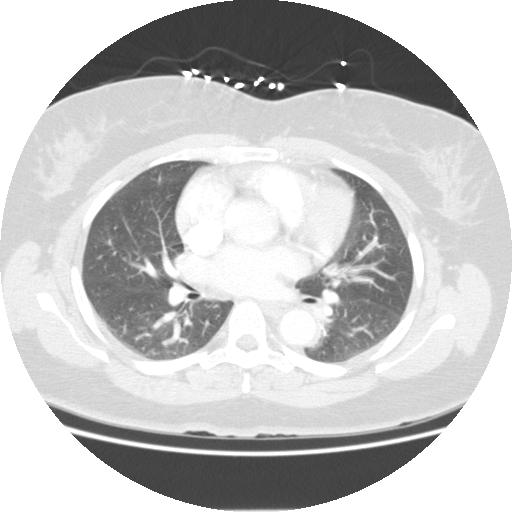
[im 169/242  mediastinal]
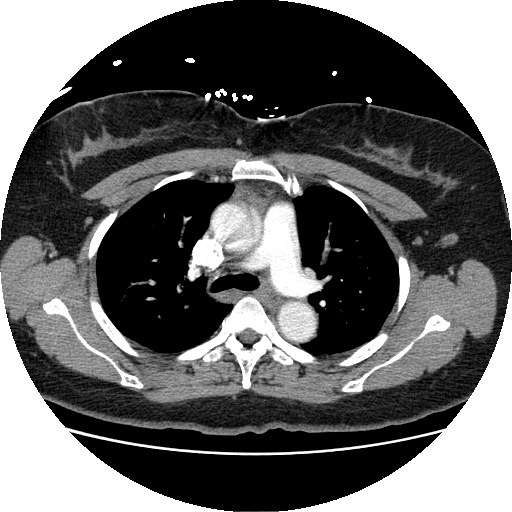
[im 193/242  lung]
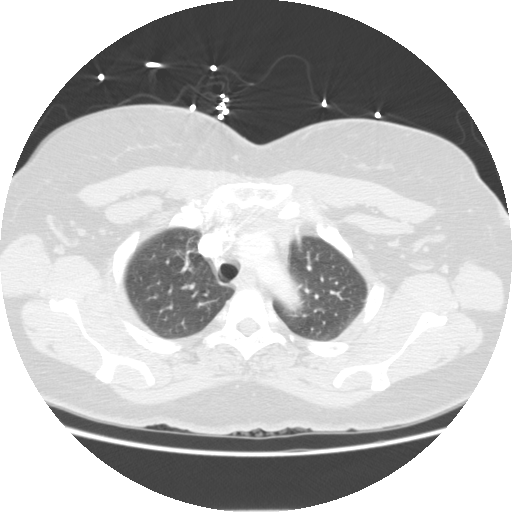
[im 217/242  mediastinal]
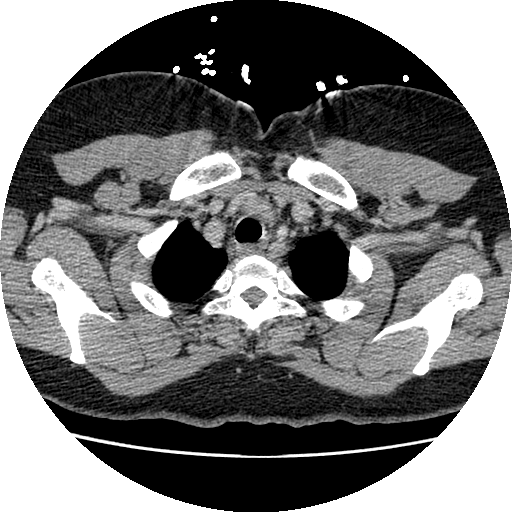

[Series 101: routine chest · axial · 0.70mm/px · z∈[-228,-80]mm · 4 of 119 slices shown (2 of 2)]
[im 30/119  lung]
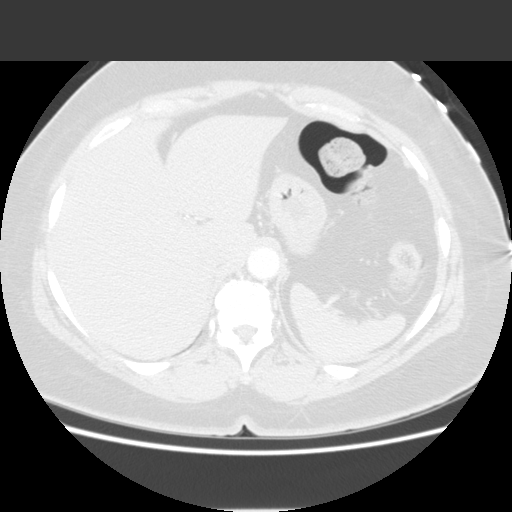
[im 58/119  lung]
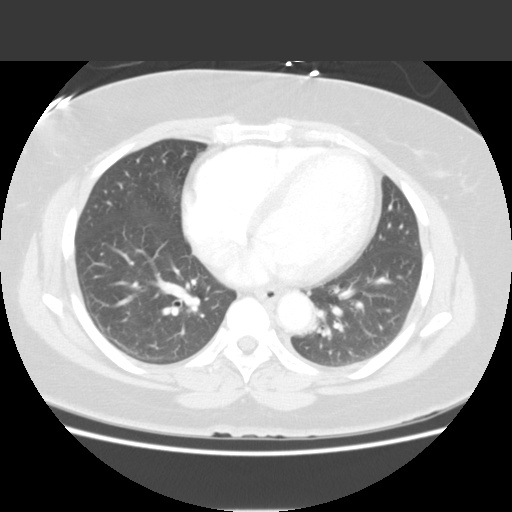
[im 60/119  lung]
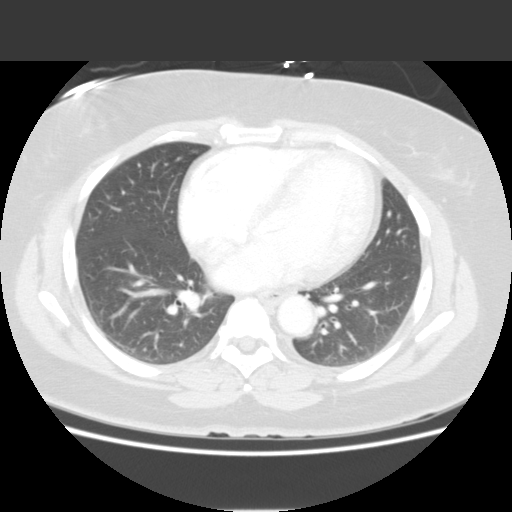
[im 89/119  lung]
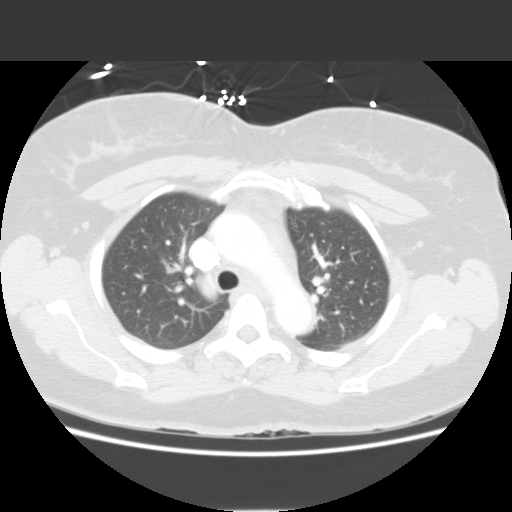

[Series 300: reformatted · sagittal · 0.70mm/px · 4 of 140 slices shown]
[im 28/140  lung]
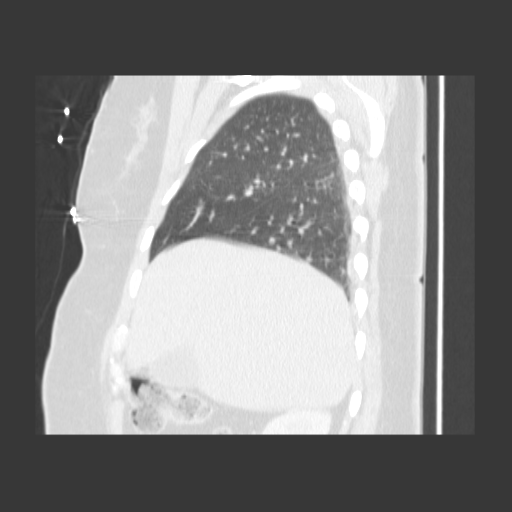
[im 56/140  lung]
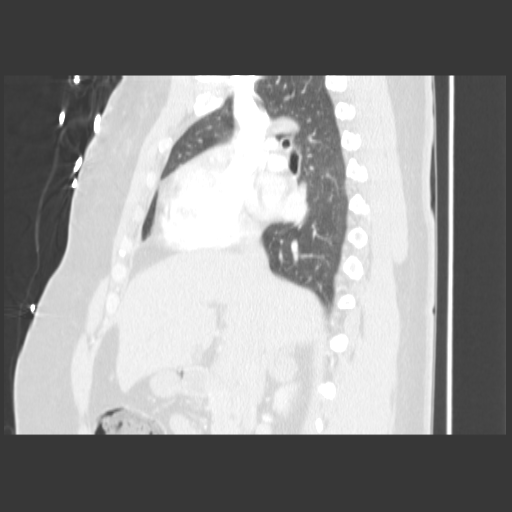
[im 84/140  lung]
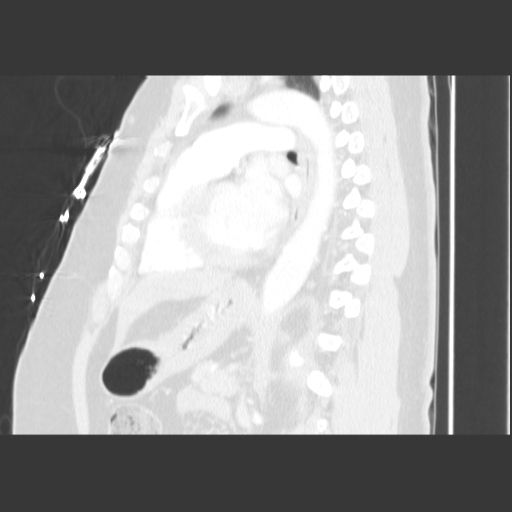
[im 112/140  lung]
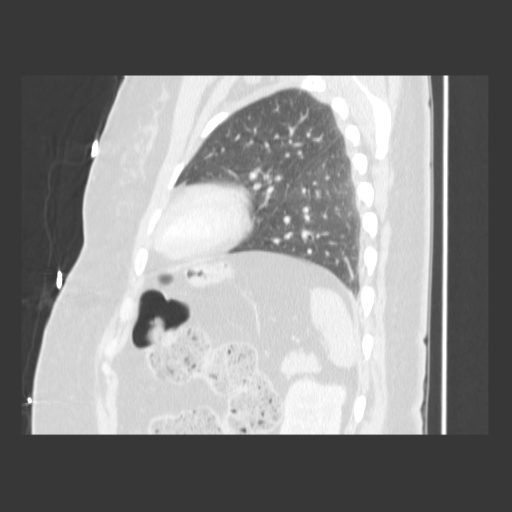

[18 of 30 positions shown; findings below may reference images not displayed]

FINDINGS: Both lungs are clear with no evidence of infiltrate, nodule, or effusion bilaterally.  No axillary, hilar, or mediastinal adenopathy are present. No filling defects are seen within the segmental or subsegmental pulmonary arteries to suggest pulmonary embolus.  The thoracic aorta has a normal caliber and appearance.
Within the visualized upper abdomen, there is a 2.4 cm mass on the right adrenal gland.
IMPRESSION: 1.  There is no evidence of pulmonary embolus.  
2.  Solid mass in the right adrenal gland with post contrast Hounsfield number of approximately 50. Though this may only represent an adrenal adenoma, further evaluation with MRI or pre and post contrasted CT scan protocolled specifically for evaluation of adrenal lesions is recommended.

## 2008-10-25 IMAGING — CR DG CHEST 1V PORT
1 series · 1 of 1 positions shown · non-contrast
Comparison: Comparison is made with chest CT 05/01/06 and portable chest radiographs 01/02/06.

CLINICAL DATA: Chest pain radiating into the left arm.
 PORTABLE CHEST - 1 VIEW ? 05/04/06 AT 3349 HOURS:

[view not recorded]
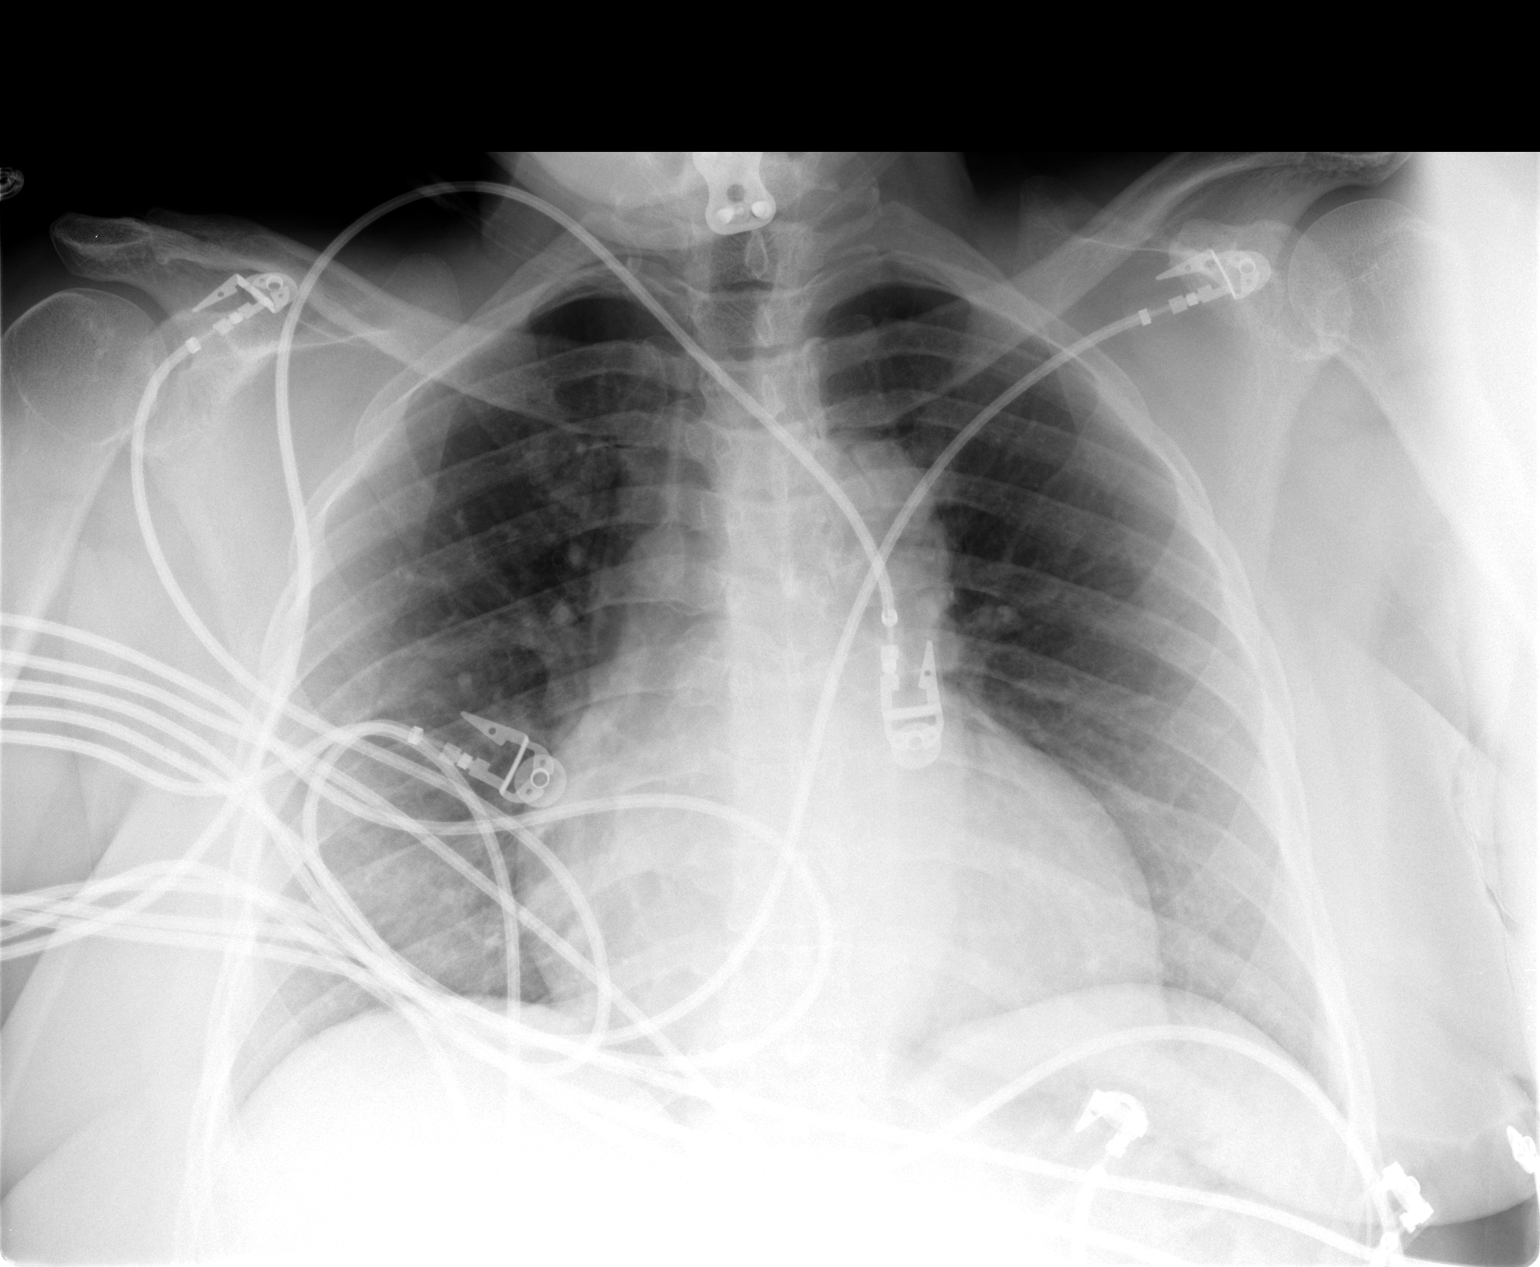

[1 of 1 positions shown; findings below may reference images not displayed]

FINDINGS: There is stable cardiomegaly and chronic vascular congestion. No edema, confluent airspace opacity, or pleural effusion is seen. Lower cervical fusion has been performed.
IMPRESSION: Stable examination with chronic cardiomegaly and vascular congestion. No acute findings.

## 2008-10-27 LAB — PROTIME-INR
INR: 2.3 (ref 2.00–3.50)
Protime: 27.6 Seconds — ABNORMAL HIGH (ref 10.6–13.4)

## 2008-11-16 ENCOUNTER — Ambulatory Visit (HOSPITAL_COMMUNITY): Admission: RE | Admit: 2008-11-16 | Discharge: 2008-11-16 | Payer: Self-pay | Admitting: Orthopedic Surgery

## 2008-11-17 ENCOUNTER — Ambulatory Visit: Payer: Self-pay | Admitting: Internal Medicine

## 2008-11-17 ENCOUNTER — Inpatient Hospital Stay (HOSPITAL_COMMUNITY): Admission: RE | Admit: 2008-11-17 | Discharge: 2008-11-25 | Payer: Self-pay | Admitting: Orthopedic Surgery

## 2008-11-17 HISTORY — PX: REPLACEMENT TOTAL KNEE: SUR1224

## 2008-11-19 ENCOUNTER — Encounter: Payer: Self-pay | Admitting: Internal Medicine

## 2008-11-19 ENCOUNTER — Ambulatory Visit: Payer: Self-pay | Admitting: Oncology

## 2008-11-20 ENCOUNTER — Encounter (INDEPENDENT_AMBULATORY_CARE_PROVIDER_SITE_OTHER): Payer: Self-pay | Admitting: Orthopedic Surgery

## 2008-11-22 ENCOUNTER — Encounter: Payer: Self-pay | Admitting: Internal Medicine

## 2008-12-01 IMAGING — CR DG CHEST 2V
2 series · 2 of 2 positions shown · non-contrast
Comparison: 05/04/06.

CLINICAL DATA: Cough, vomiting, chest pain.  
 CHEST - 2 VIEW:

[w chest pa *]
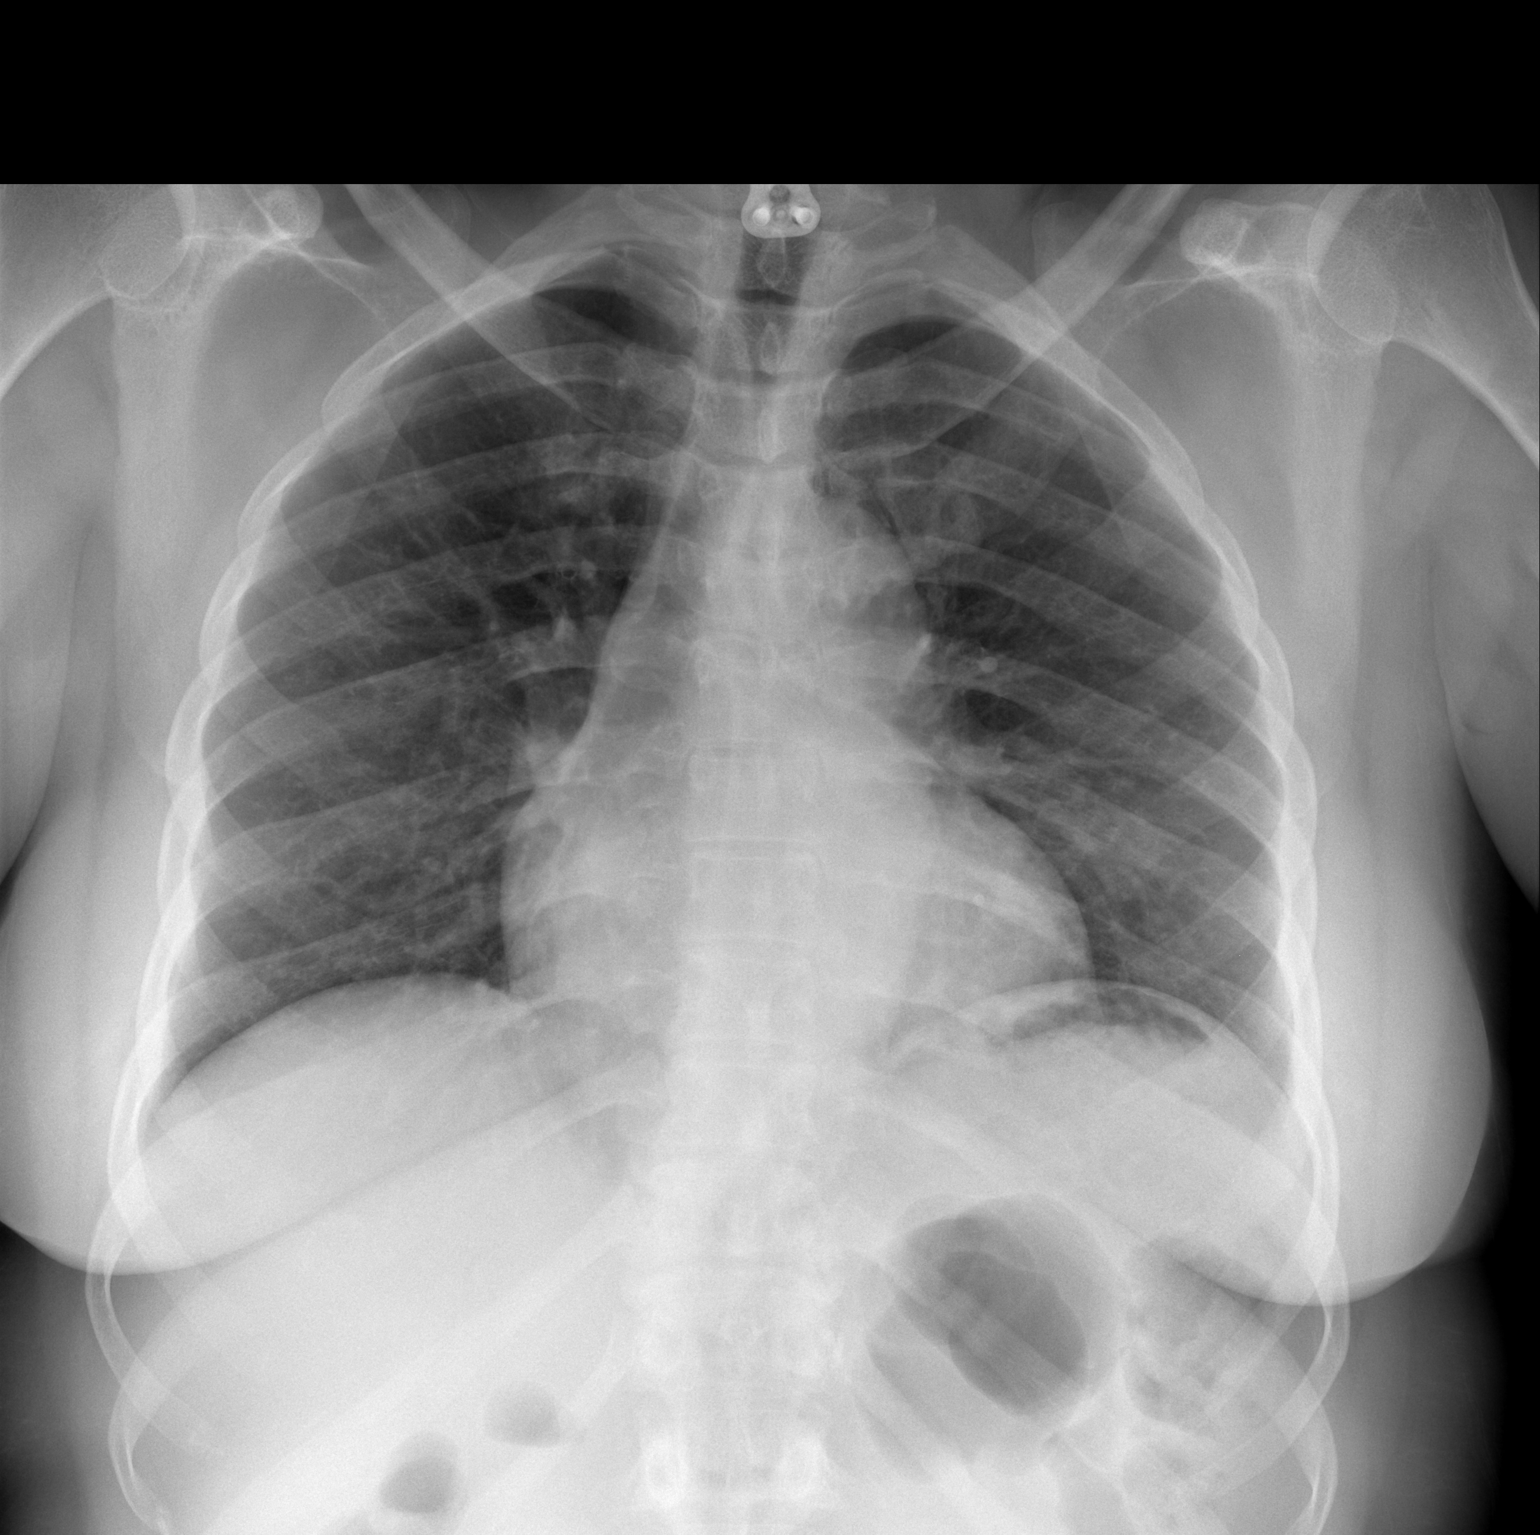

[w chest lat *]
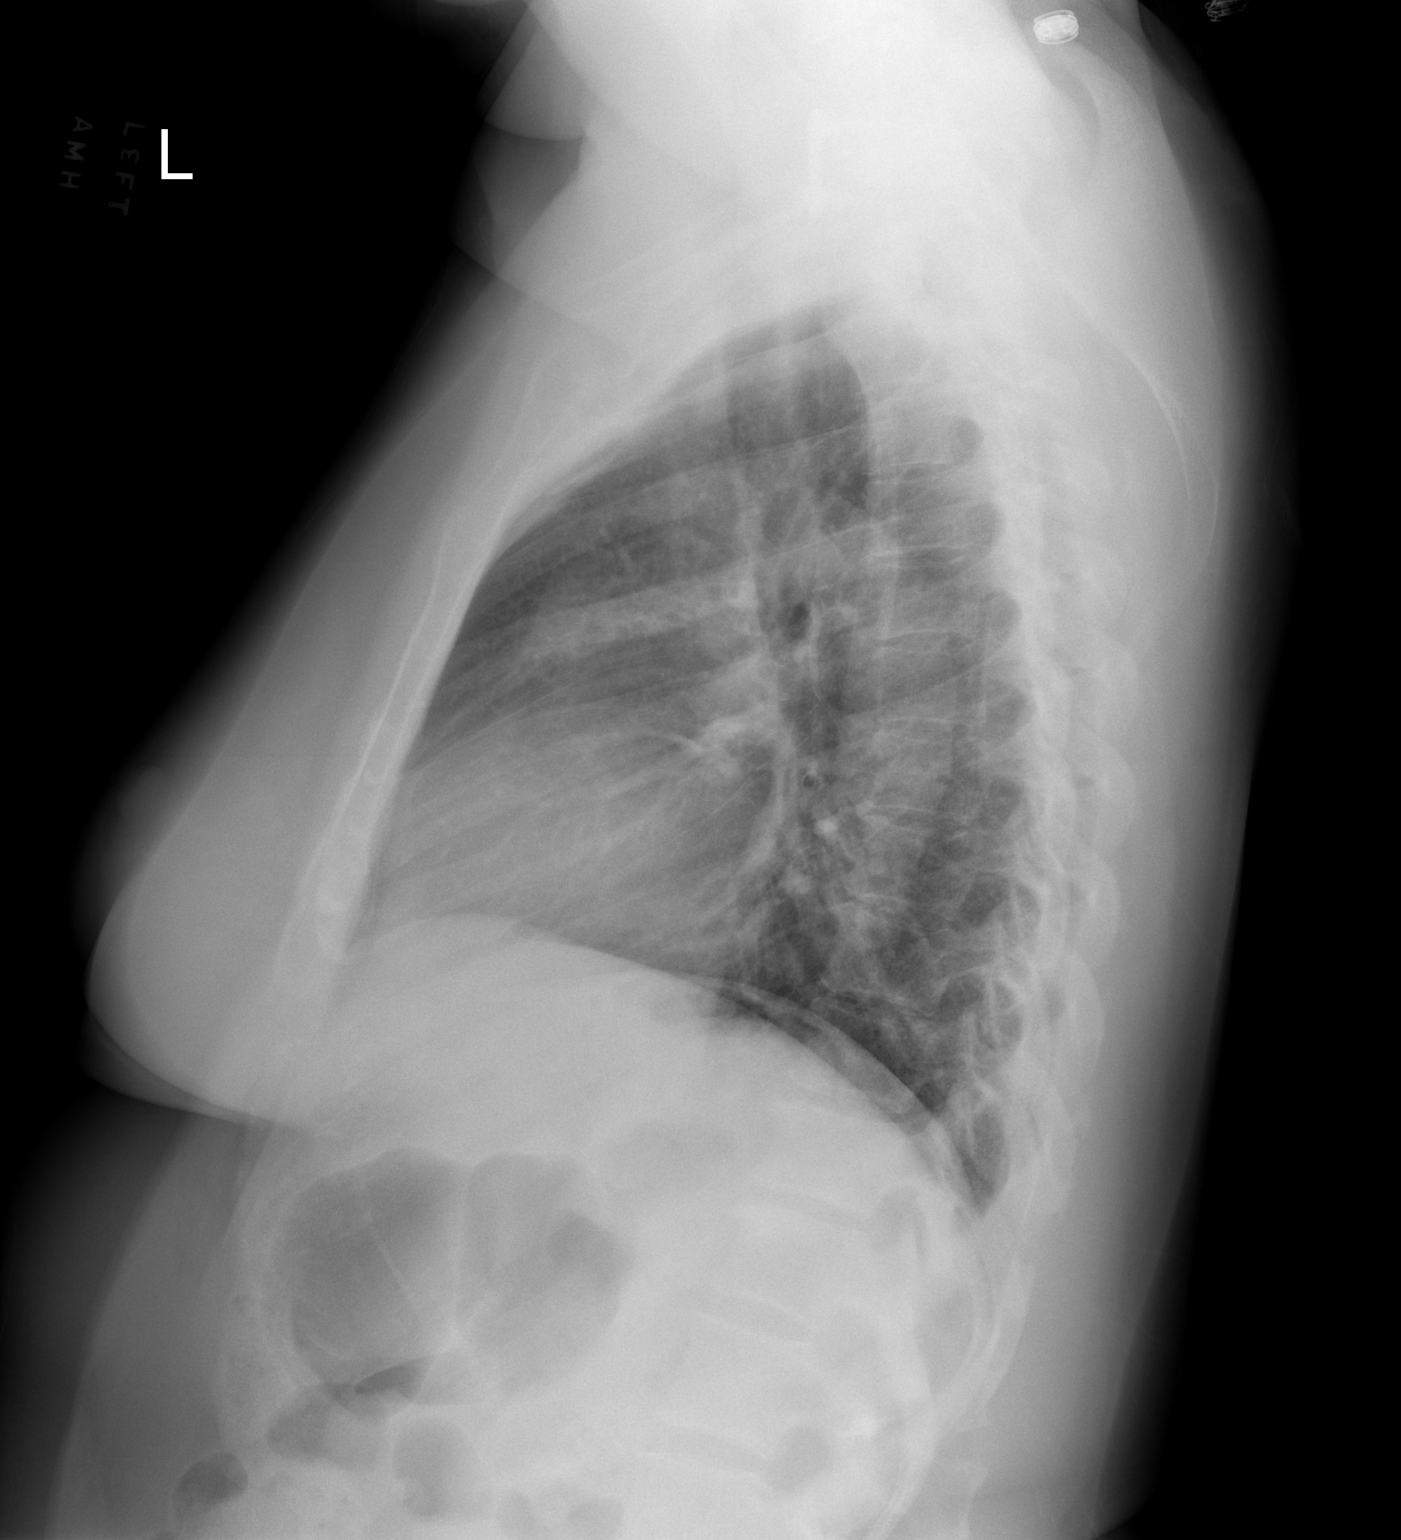

[2 of 2 positions shown; findings below may reference images not displayed]

FINDINGS: Heart size is mildly enlarged.  There are no effusions or edema.  No airspace opacities are identified.
IMPRESSION: Cardiac enlargement without failure.

## 2008-12-03 IMAGING — CR DG CHEST 2V
2 series · 2 of 2 positions shown · non-contrast
Comparison: 06/10/06.

CLINICAL DATA: Hemoptysis. Vomiting.  
 CHEST - 2 VIEW - 06/12/06:

[w chest pa]
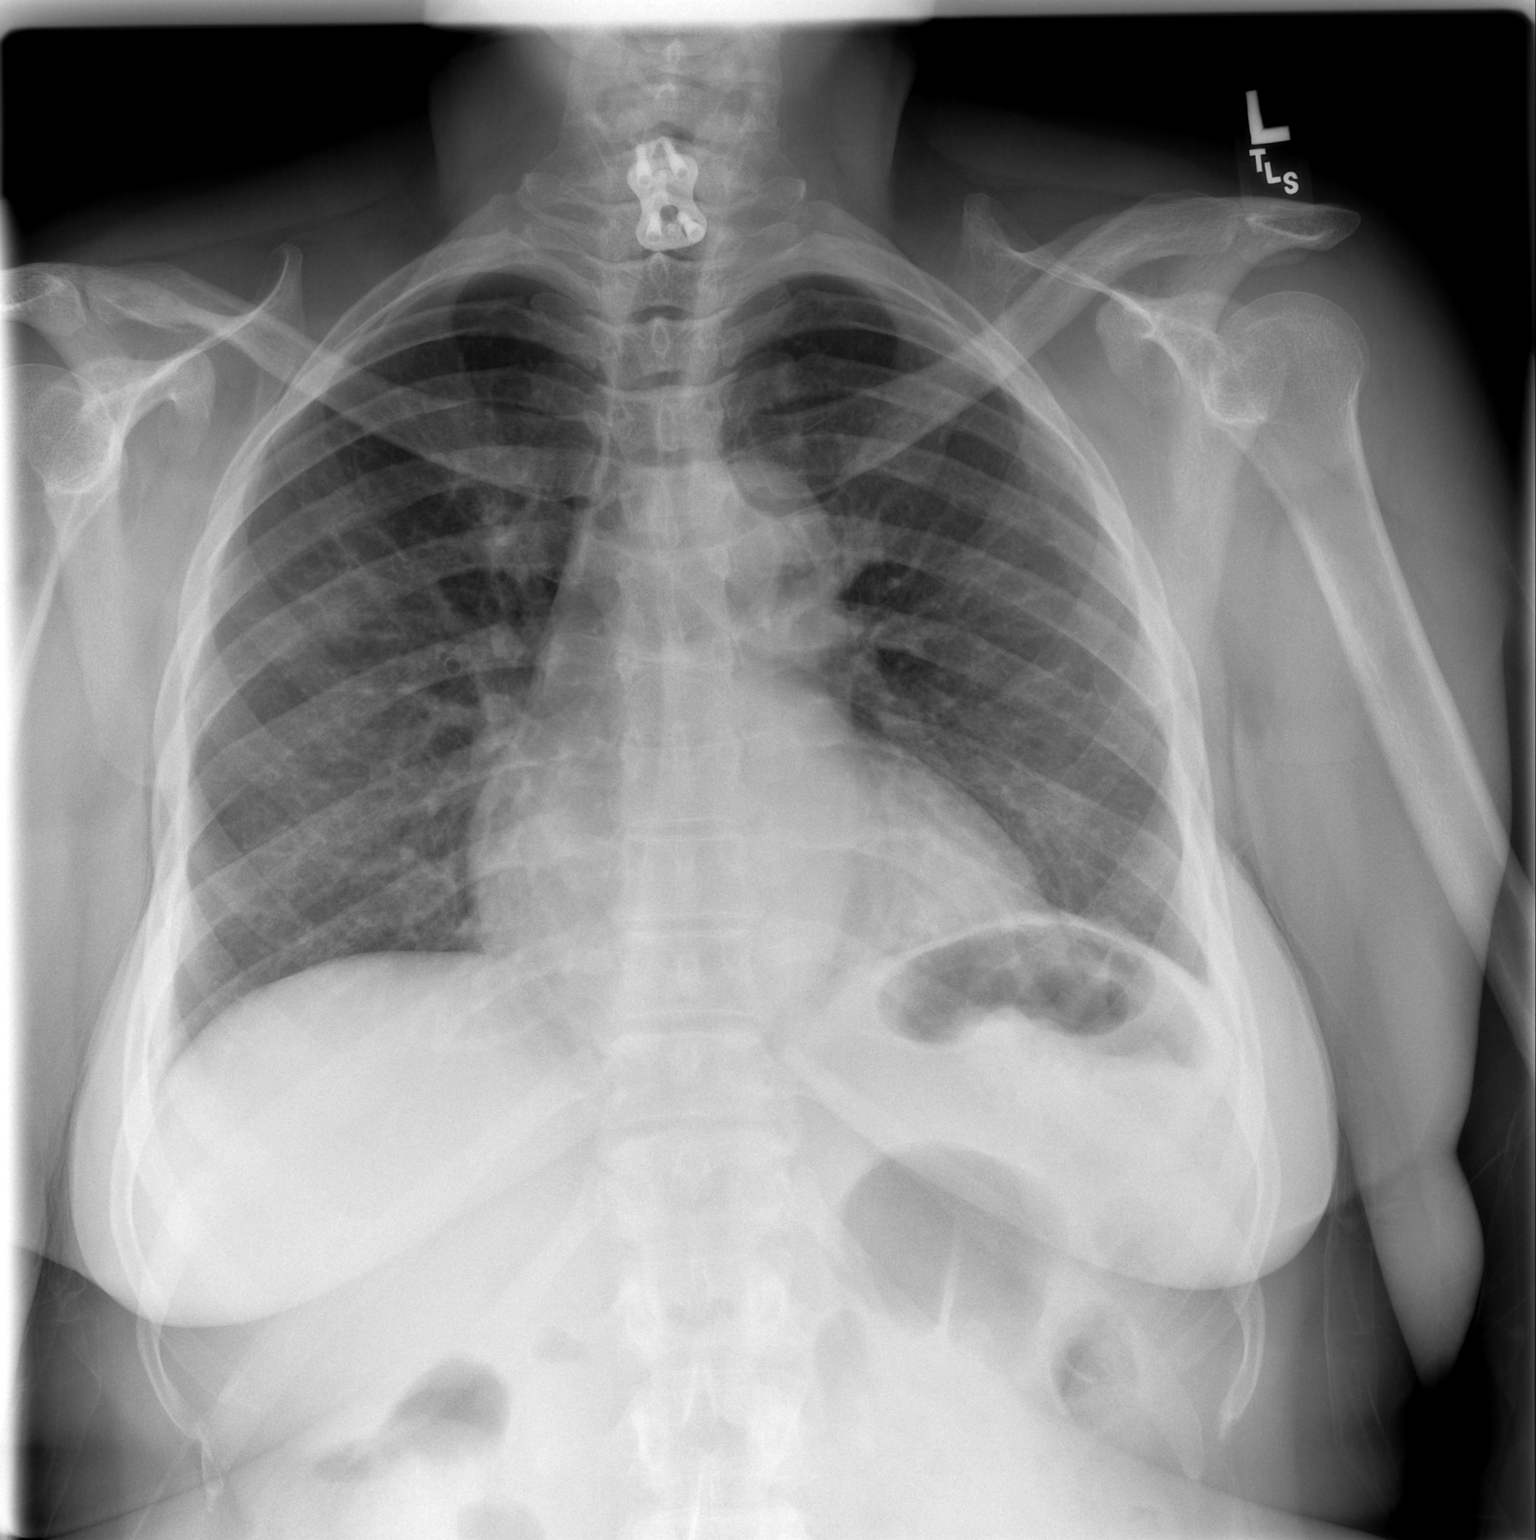

[w chest lat]
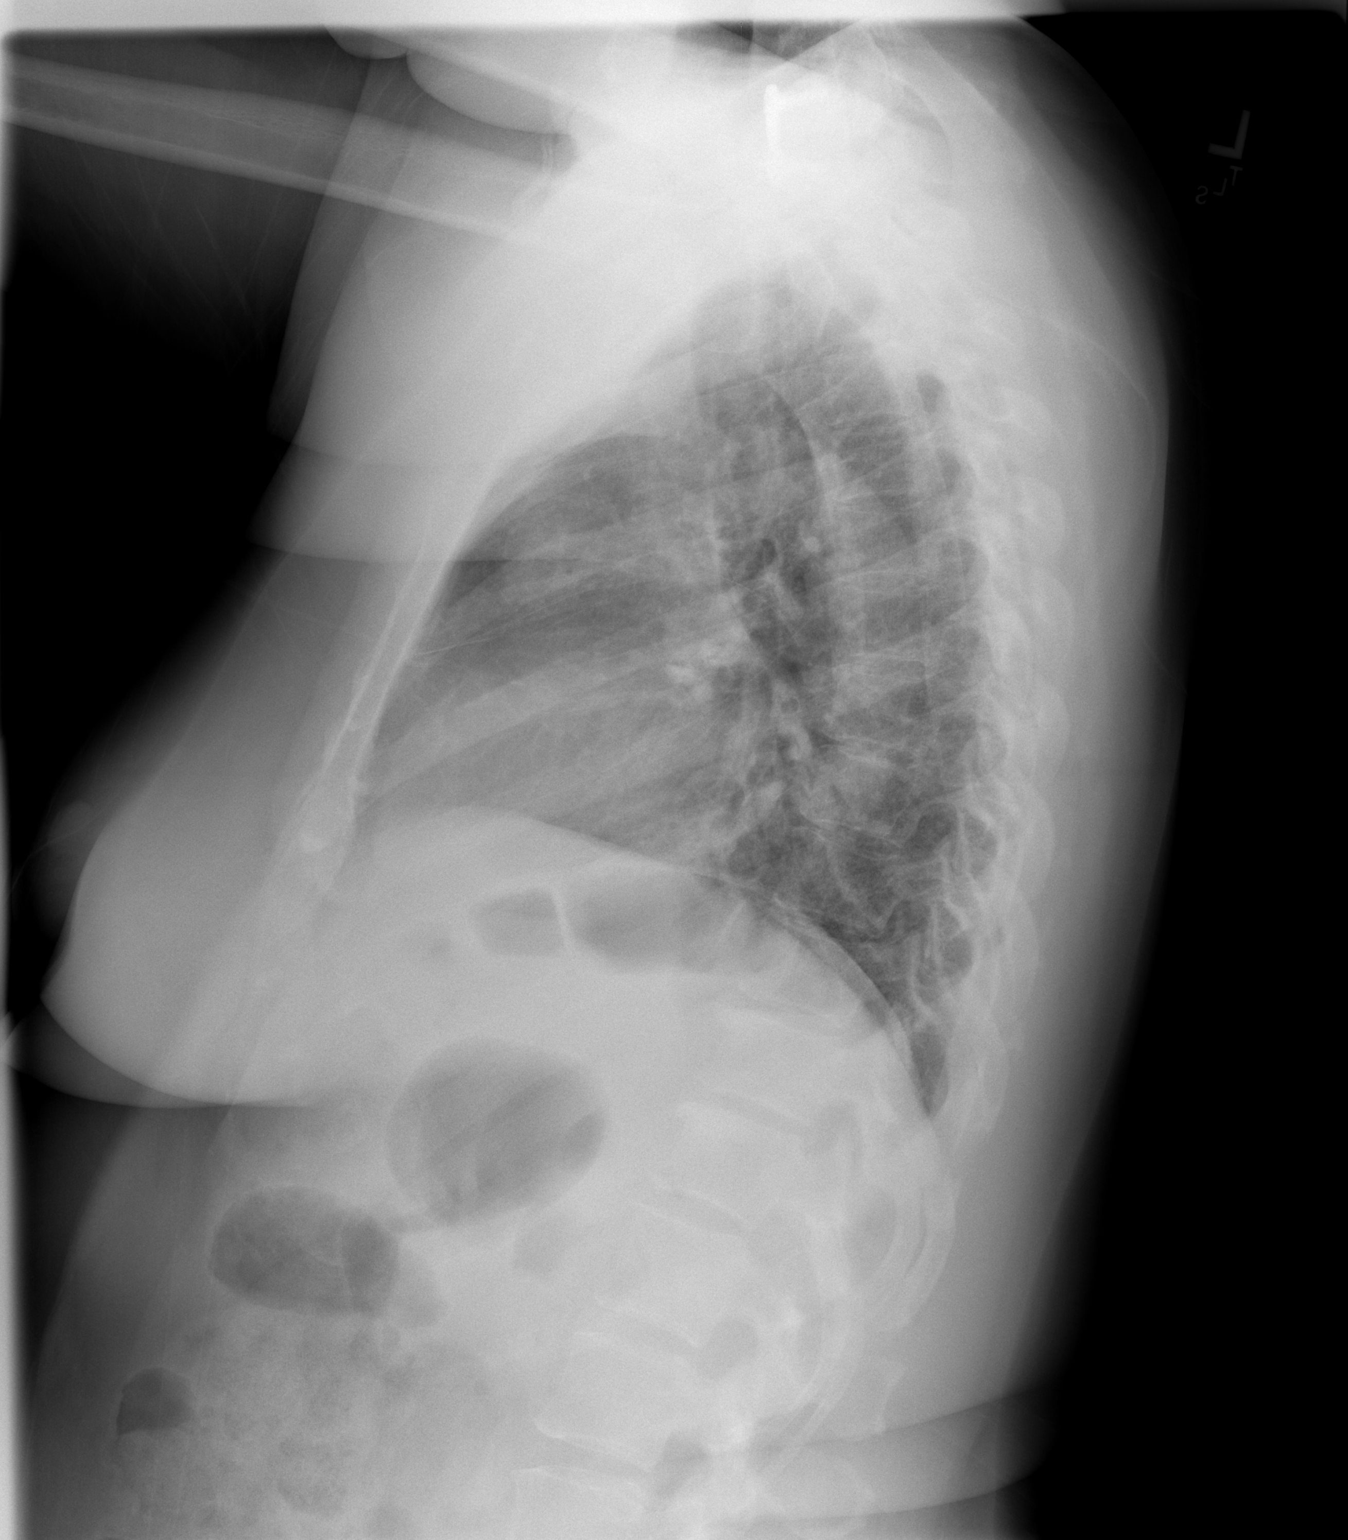

[2 of 2 positions shown; findings below may reference images not displayed]

FINDINGS: The heart size is normal.   There are low lung volumes.  There are no effusions or edema.  Mild bibasilar atelectasis is noted.  There is a faint opacity within the right upper lobe which may represent early air space disease.
IMPRESSION: Possible early air space disease in right upper lobe.  Follow-up imaging recommended to ensure resolution.

## 2008-12-16 ENCOUNTER — Ambulatory Visit (HOSPITAL_COMMUNITY): Admission: RE | Admit: 2008-12-16 | Discharge: 2008-12-16 | Payer: Self-pay | Admitting: Orthopedic Surgery

## 2009-01-05 ENCOUNTER — Ambulatory Visit: Payer: Self-pay | Admitting: Oncology

## 2009-01-07 LAB — PROTIME-INR
INR: 1.5 — ABNORMAL LOW (ref 2.00–3.50)
Protime: 18 Seconds — ABNORMAL HIGH (ref 10.6–13.4)

## 2009-01-19 ENCOUNTER — Encounter: Admission: RE | Admit: 2009-01-19 | Discharge: 2009-01-19 | Payer: Self-pay | Admitting: Internal Medicine

## 2009-01-25 ENCOUNTER — Encounter: Admission: RE | Admit: 2009-01-25 | Discharge: 2009-03-17 | Payer: Self-pay | Admitting: Orthopedic Surgery

## 2009-01-29 LAB — PROTIME-INR
INR: 1.6 — ABNORMAL LOW (ref 2.00–3.50)
Protime: 19.2 Seconds — ABNORMAL HIGH (ref 10.6–13.4)

## 2009-02-08 ENCOUNTER — Ambulatory Visit: Payer: Self-pay | Admitting: Oncology

## 2009-02-09 ENCOUNTER — Ambulatory Visit (HOSPITAL_COMMUNITY): Admission: RE | Admit: 2009-02-09 | Discharge: 2009-02-09 | Payer: Self-pay | Admitting: Interventional Radiology

## 2009-02-12 LAB — PROTIME-INR
INR: 1.5 — ABNORMAL LOW (ref 2.00–3.50)
Protime: 18 Seconds — ABNORMAL HIGH (ref 10.6–13.4)

## 2009-02-19 LAB — CBC WITH DIFFERENTIAL/PLATELET
BASO%: 0.2 % (ref 0.0–2.0)
Basophils Absolute: 0 10*3/uL (ref 0.0–0.1)
EOS%: 2.6 % (ref 0.0–7.0)
Eosinophils Absolute: 0.2 10*3/uL (ref 0.0–0.5)
HCT: 36.3 % (ref 34.8–46.6)
HGB: 11.7 g/dL (ref 11.6–15.9)
LYMPH%: 35.5 % (ref 14.0–49.7)
MCH: 27.7 pg (ref 25.1–34.0)
MCHC: 32.2 g/dL (ref 31.5–36.0)
MCV: 86 fL (ref 79.5–101.0)
MONO#: 0.4 10*3/uL (ref 0.1–0.9)
MONO%: 7.3 % (ref 0.0–14.0)
NEUT#: 3.1 10*3/uL (ref 1.5–6.5)
NEUT%: 54.4 % (ref 38.4–76.8)
Platelets: 306 10*3/uL (ref 145–400)
RBC: 4.22 10*6/uL (ref 3.70–5.45)
RDW: 17.3 % — ABNORMAL HIGH (ref 11.2–14.5)
WBC: 5.8 10*3/uL (ref 3.9–10.3)
lymph#: 2.1 10*3/uL (ref 0.9–3.3)

## 2009-02-19 LAB — PROTIME-INR
INR: 2.4 (ref 2.00–3.50)
Protime: 28.8 Seconds — ABNORMAL HIGH (ref 10.6–13.4)

## 2009-03-24 ENCOUNTER — Ambulatory Visit: Payer: Self-pay | Admitting: Oncology

## 2009-03-26 LAB — PROTIME-INR
INR: 1.6 — ABNORMAL LOW (ref 2.00–3.50)
Protime: 19.2 Seconds — ABNORMAL HIGH (ref 10.6–13.4)

## 2009-04-09 LAB — PROTIME-INR
INR: 2 (ref 2.00–3.50)
Protime: 24 Seconds — ABNORMAL HIGH (ref 10.6–13.4)

## 2009-04-11 IMAGING — CR DG KNEE 1-2V BILAT
4 series · 4 of 4 positions shown · non-contrast
Comparison: Left knee 05/01/06.
Left Knee:

CLINICAL DATA: Knee pain since [REDACTED].
BILATERAL KNEES ? 4 VIEW (2 VIEWS EACH):

[t knee ap left]
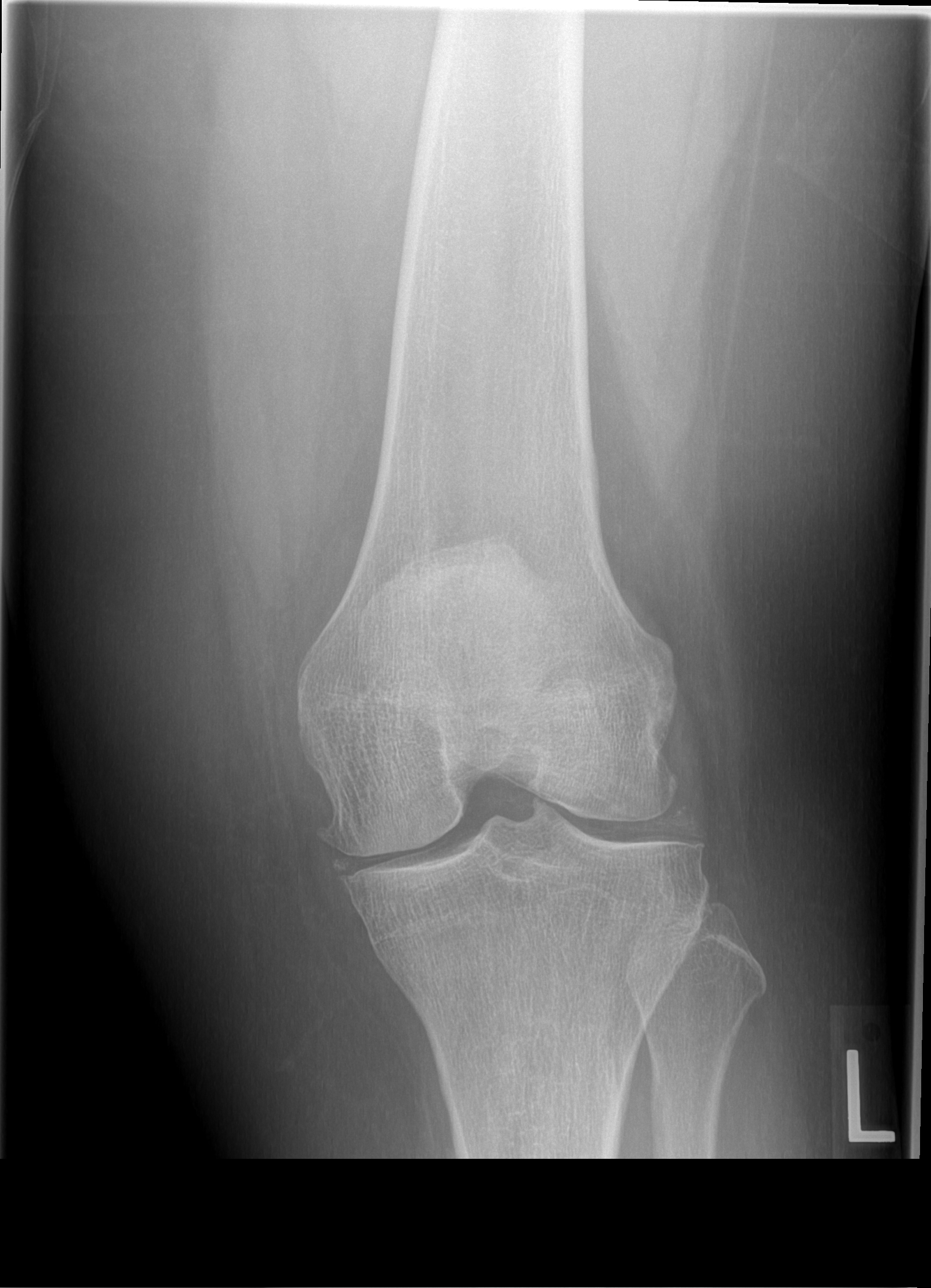

[t knee lat left]
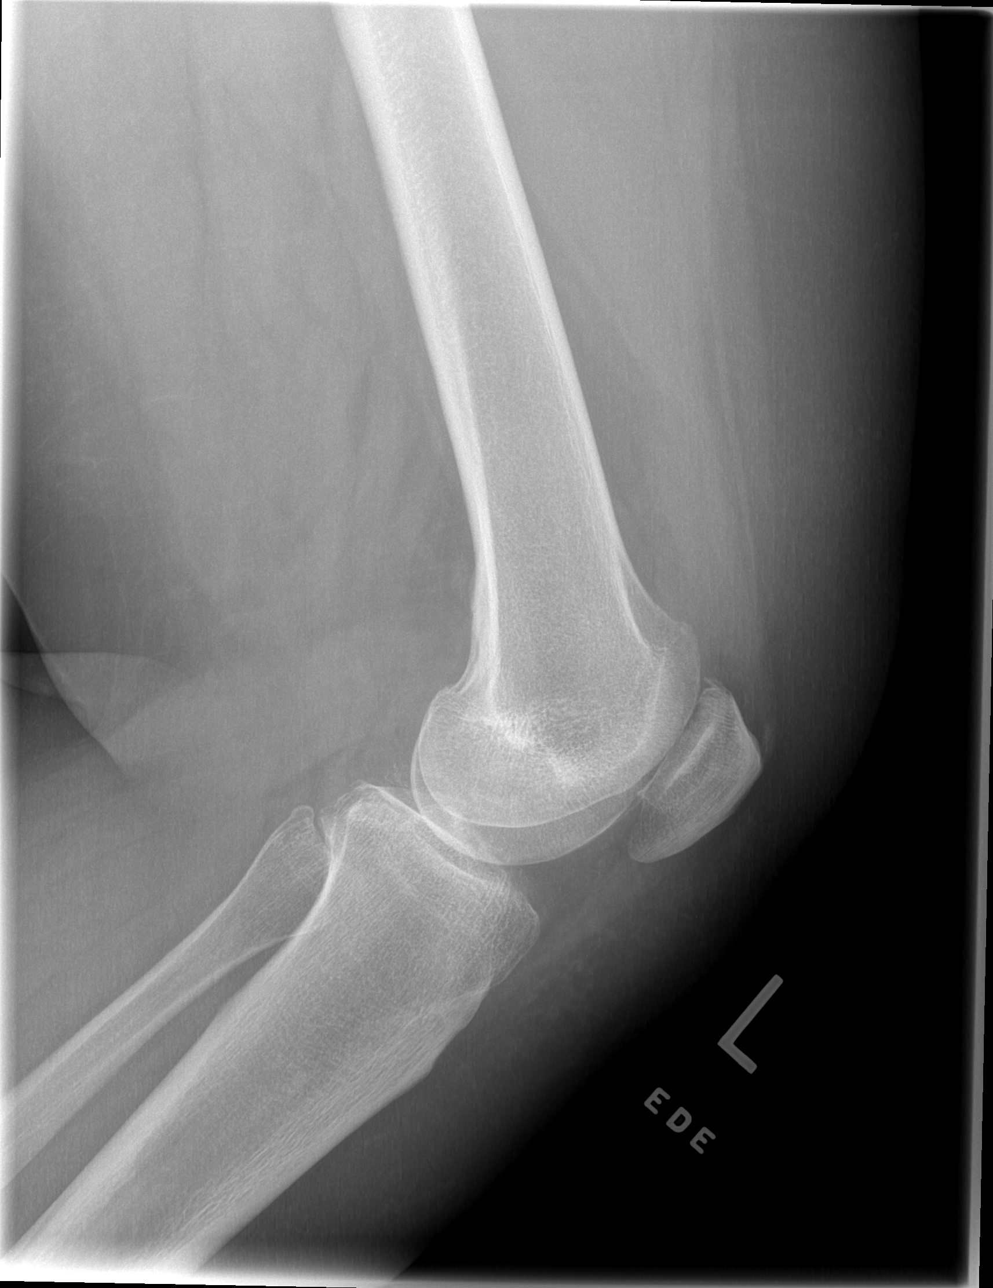

[t knee ap right]
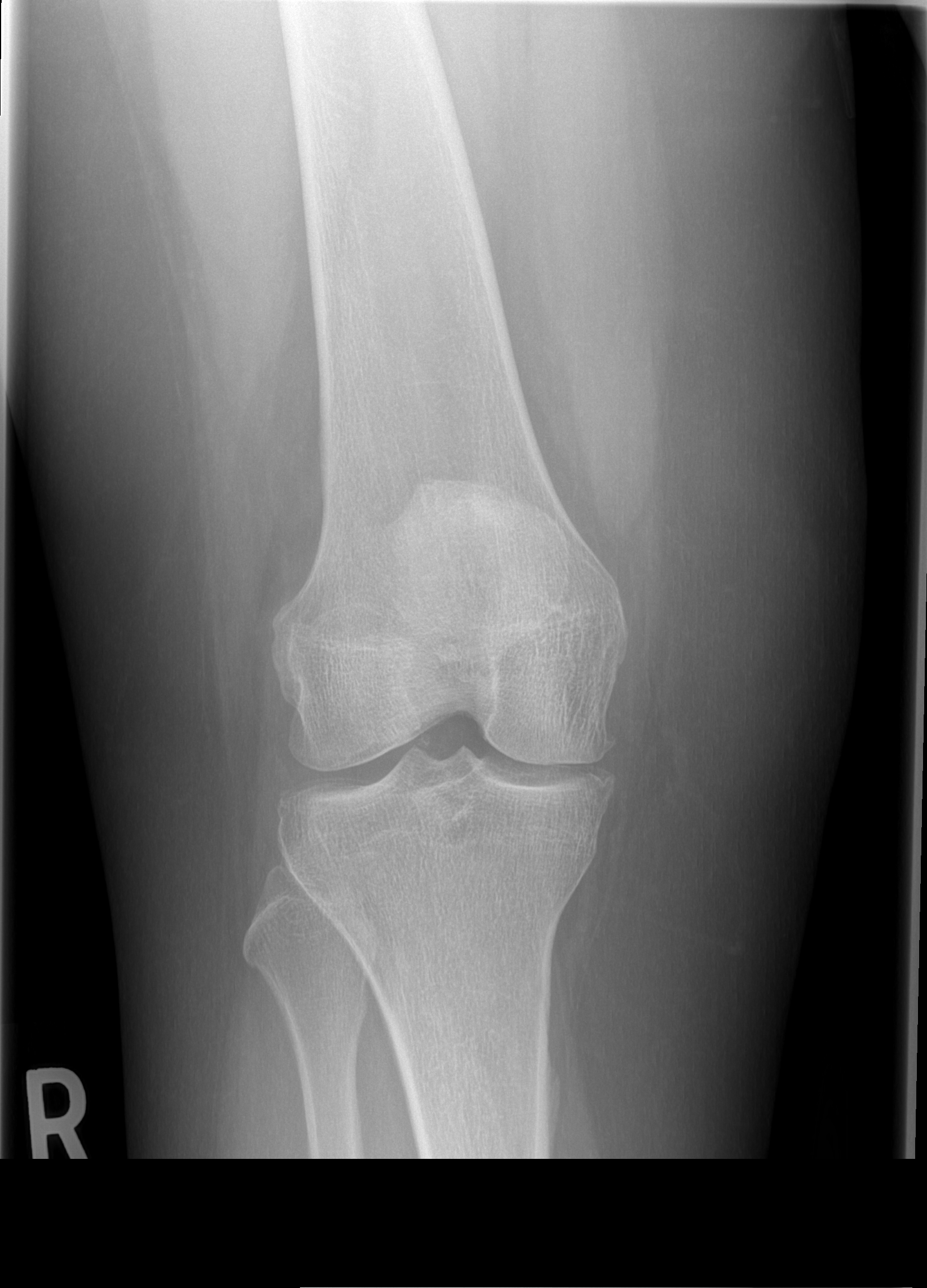

[t knee lat right]
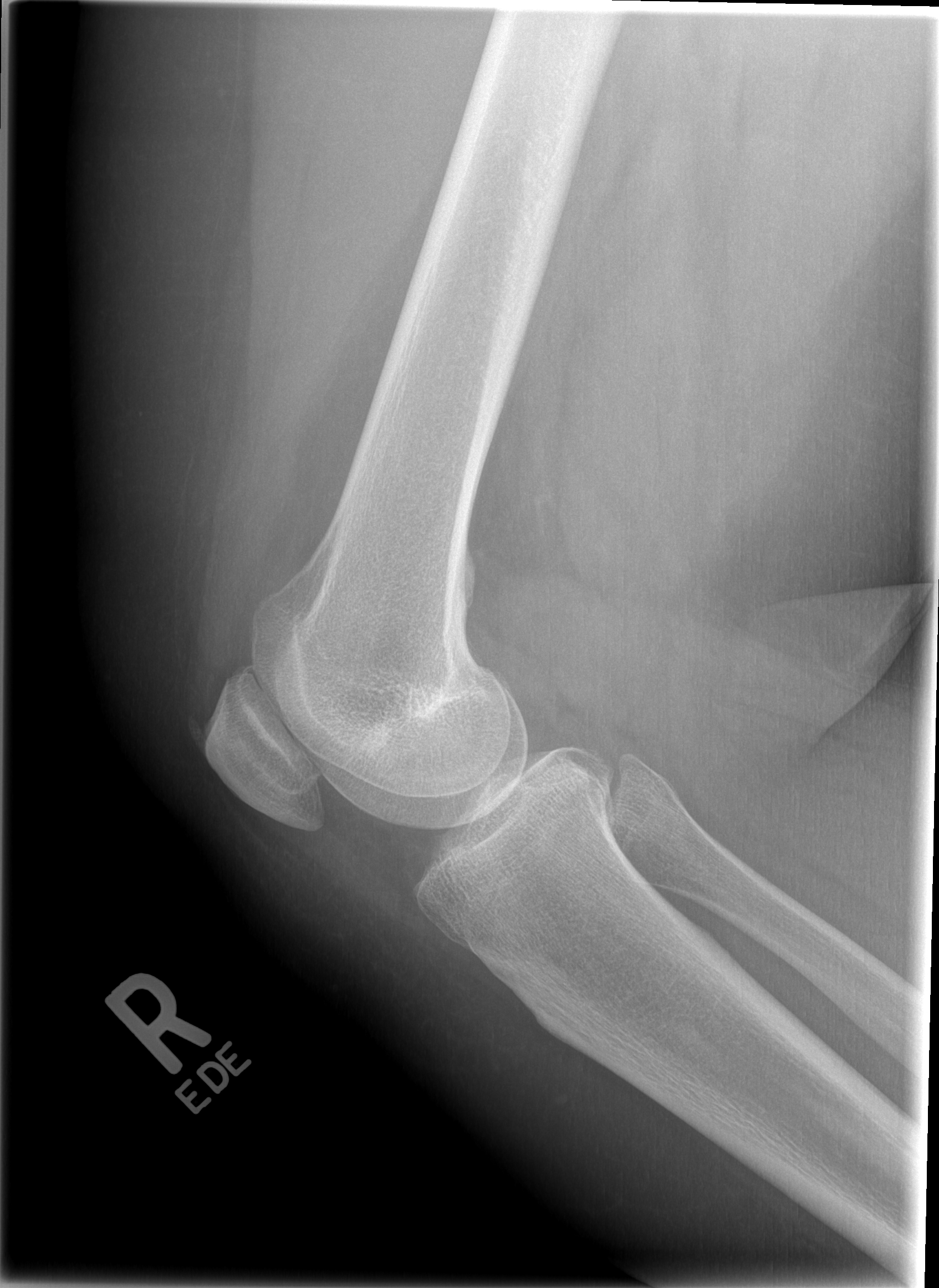

[4 of 4 positions shown; findings below may reference images not displayed]

FINDINGS: Chondrocalcinosis with tricompartment degenerative changes and joint space narrowing most notable medial tibial femoral joint space. Small joint effusion.
IMPRESSION: Chondrocalcinosis and degenerative changes as noted.
Right knee:
FINDINGS: Mild medial tibial femoral joint space narrowing with small bony spur.  Small ossific structure along the tibial spine region. A small loose body cannot be excluded.
IMPRESSION: Mild medial tibial femoral joint space degenerative changes.

## 2009-04-27 ENCOUNTER — Ambulatory Visit: Payer: Self-pay | Admitting: Oncology

## 2009-04-30 LAB — PROTIME-INR
INR: 1.9 — ABNORMAL LOW (ref 2.00–3.50)
Protime: 22.8 Seconds — ABNORMAL HIGH (ref 10.6–13.4)

## 2009-05-14 LAB — CBC WITH DIFFERENTIAL/PLATELET
BASO%: 0.2 % (ref 0.0–2.0)
Basophils Absolute: 0 10*3/uL (ref 0.0–0.1)
EOS%: 2 % (ref 0.0–7.0)
Eosinophils Absolute: 0.1 10*3/uL (ref 0.0–0.5)
HCT: 35 % (ref 34.8–46.6)
HGB: 11.3 g/dL — ABNORMAL LOW (ref 11.6–15.9)
LYMPH%: 34.7 % (ref 14.0–49.7)
MCH: 27 pg (ref 25.1–34.0)
MCHC: 32.3 g/dL (ref 31.5–36.0)
MCV: 83.5 fL (ref 79.5–101.0)
MONO#: 0.3 10*3/uL (ref 0.1–0.9)
MONO%: 7 % (ref 0.0–14.0)
NEUT#: 2.6 10*3/uL (ref 1.5–6.5)
NEUT%: 56.1 % (ref 38.4–76.8)
Platelets: 271 10*3/uL (ref 145–400)
RBC: 4.19 10*6/uL (ref 3.70–5.45)
RDW: 16.7 % — ABNORMAL HIGH (ref 11.2–14.5)
WBC: 4.6 10*3/uL (ref 3.9–10.3)
lymph#: 1.6 10*3/uL (ref 0.9–3.3)
nRBC: 0 % (ref 0–0)

## 2009-05-14 LAB — PROTIME-INR
INR: 2.5 (ref 2.00–3.50)
Protime: 30 Seconds — ABNORMAL HIGH (ref 10.6–13.4)

## 2009-05-27 ENCOUNTER — Ambulatory Visit: Payer: Self-pay | Admitting: Oncology

## 2009-05-31 LAB — BASIC METABOLIC PANEL
BUN: 13 mg/dL (ref 6–23)
CO2: 25 mEq/L (ref 19–32)
Calcium: 9.4 mg/dL (ref 8.4–10.5)
Chloride: 101 mEq/L (ref 96–112)
Creatinine, Ser: 0.84 mg/dL (ref 0.40–1.20)
Glucose, Bld: 93 mg/dL (ref 70–99)
Potassium: 3.5 mEq/L (ref 3.5–5.3)
Sodium: 140 mEq/L (ref 135–145)

## 2009-05-31 LAB — PROTIME-INR
INR: 2.6 (ref 2.00–3.50)
Protime: 31.2 Seconds — ABNORMAL HIGH (ref 10.6–13.4)

## 2009-06-16 ENCOUNTER — Ambulatory Visit (HOSPITAL_COMMUNITY): Admission: RE | Admit: 2009-06-16 | Discharge: 2009-06-16 | Payer: Self-pay | Admitting: Oncology

## 2009-06-25 LAB — PROTIME-INR
INR: 2.2 (ref 2.00–3.50)
Protime: 26.4 Seconds — ABNORMAL HIGH (ref 10.6–13.4)

## 2009-07-23 ENCOUNTER — Ambulatory Visit: Payer: Self-pay | Admitting: Oncology

## 2009-08-19 LAB — PROTIME-INR
INR: 1.7 — ABNORMAL LOW (ref 2.00–3.50)
Protime: 20.4 Seconds — ABNORMAL HIGH (ref 10.6–13.4)

## 2009-09-23 ENCOUNTER — Ambulatory Visit: Payer: Self-pay | Admitting: Oncology

## 2009-10-22 LAB — PROTIME-INR
INR: 1.4 — ABNORMAL LOW (ref 2.00–3.50)
Protime: 16.8 Seconds — ABNORMAL HIGH (ref 10.6–13.4)

## 2009-10-23 IMAGING — CR DG FOOT COMPLETE 3+V*R*
3 series · 3 of 3 positions shown · non-contrast
Comparison: None

CLINICAL DATA: Pain and swelling without injury.

RIGHT FOOT - 3 VIEW

[t foot ap right]
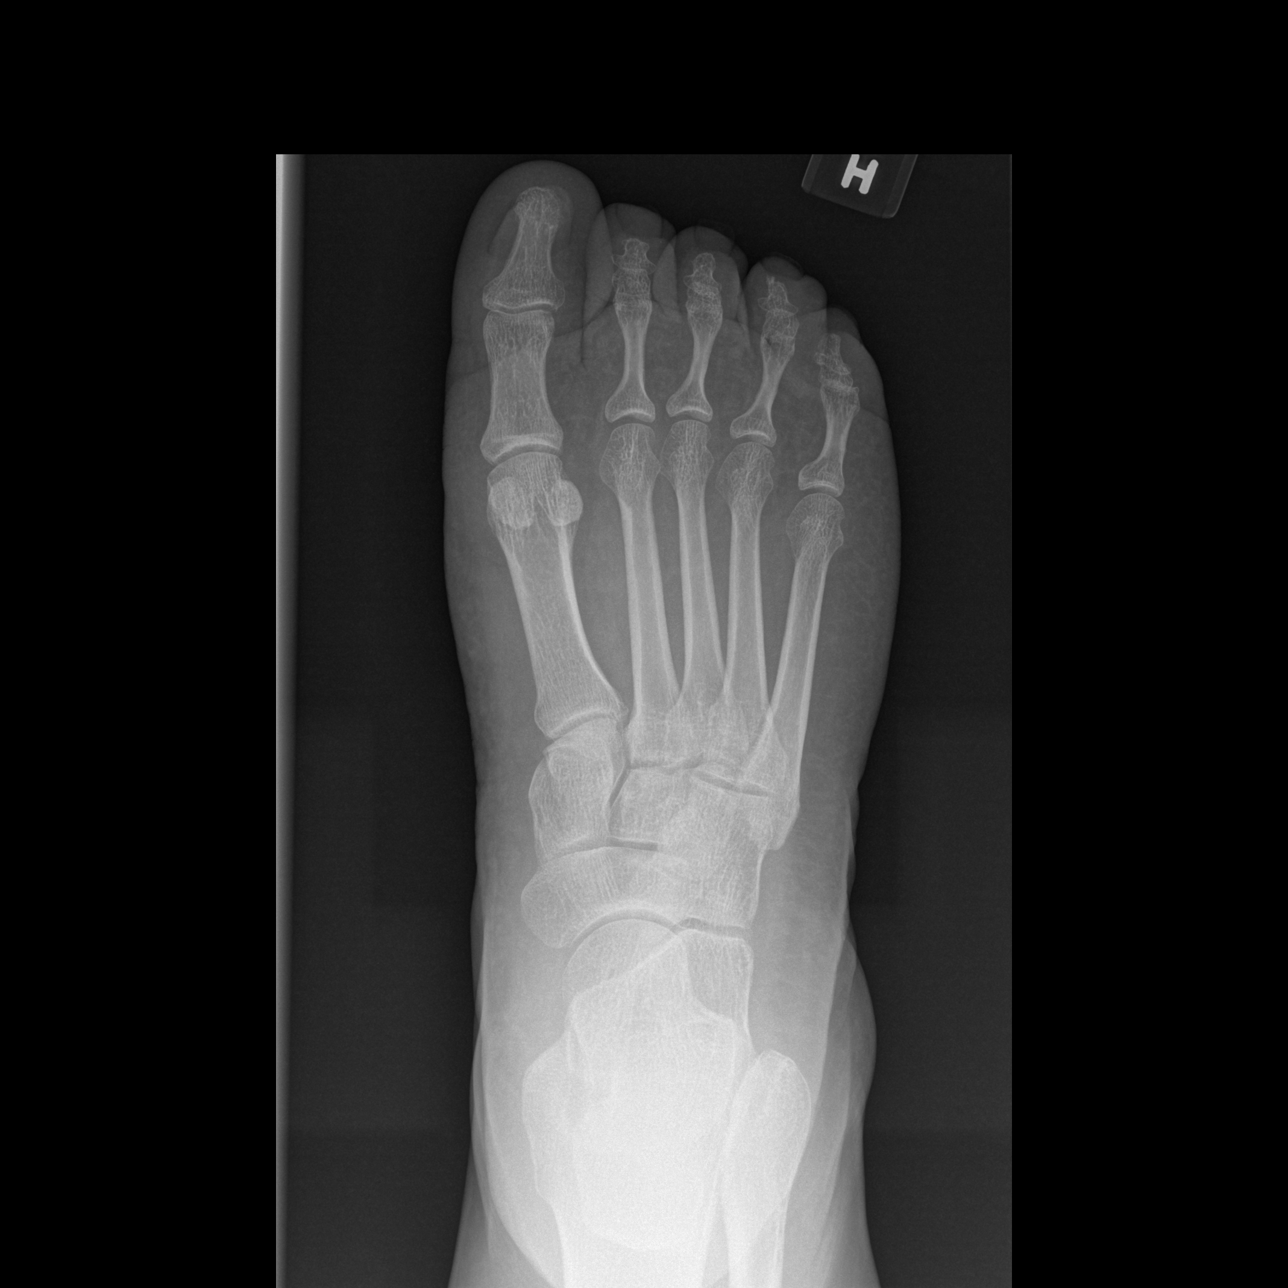

[t foot oblique right]
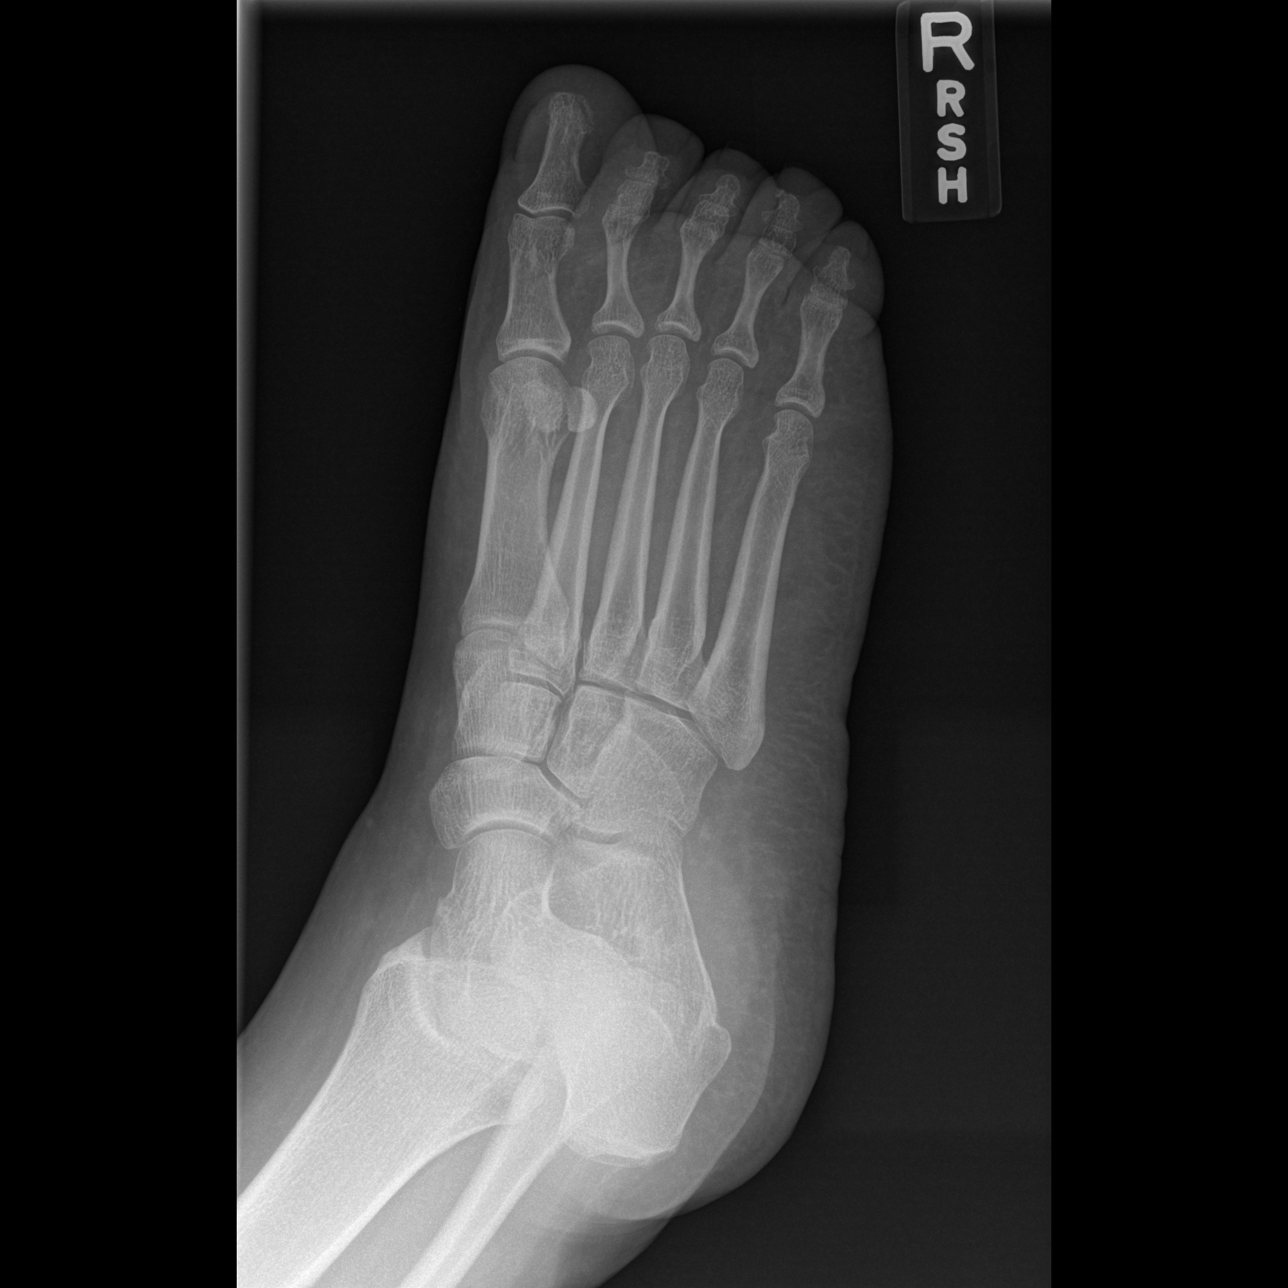

[t foot lat right]
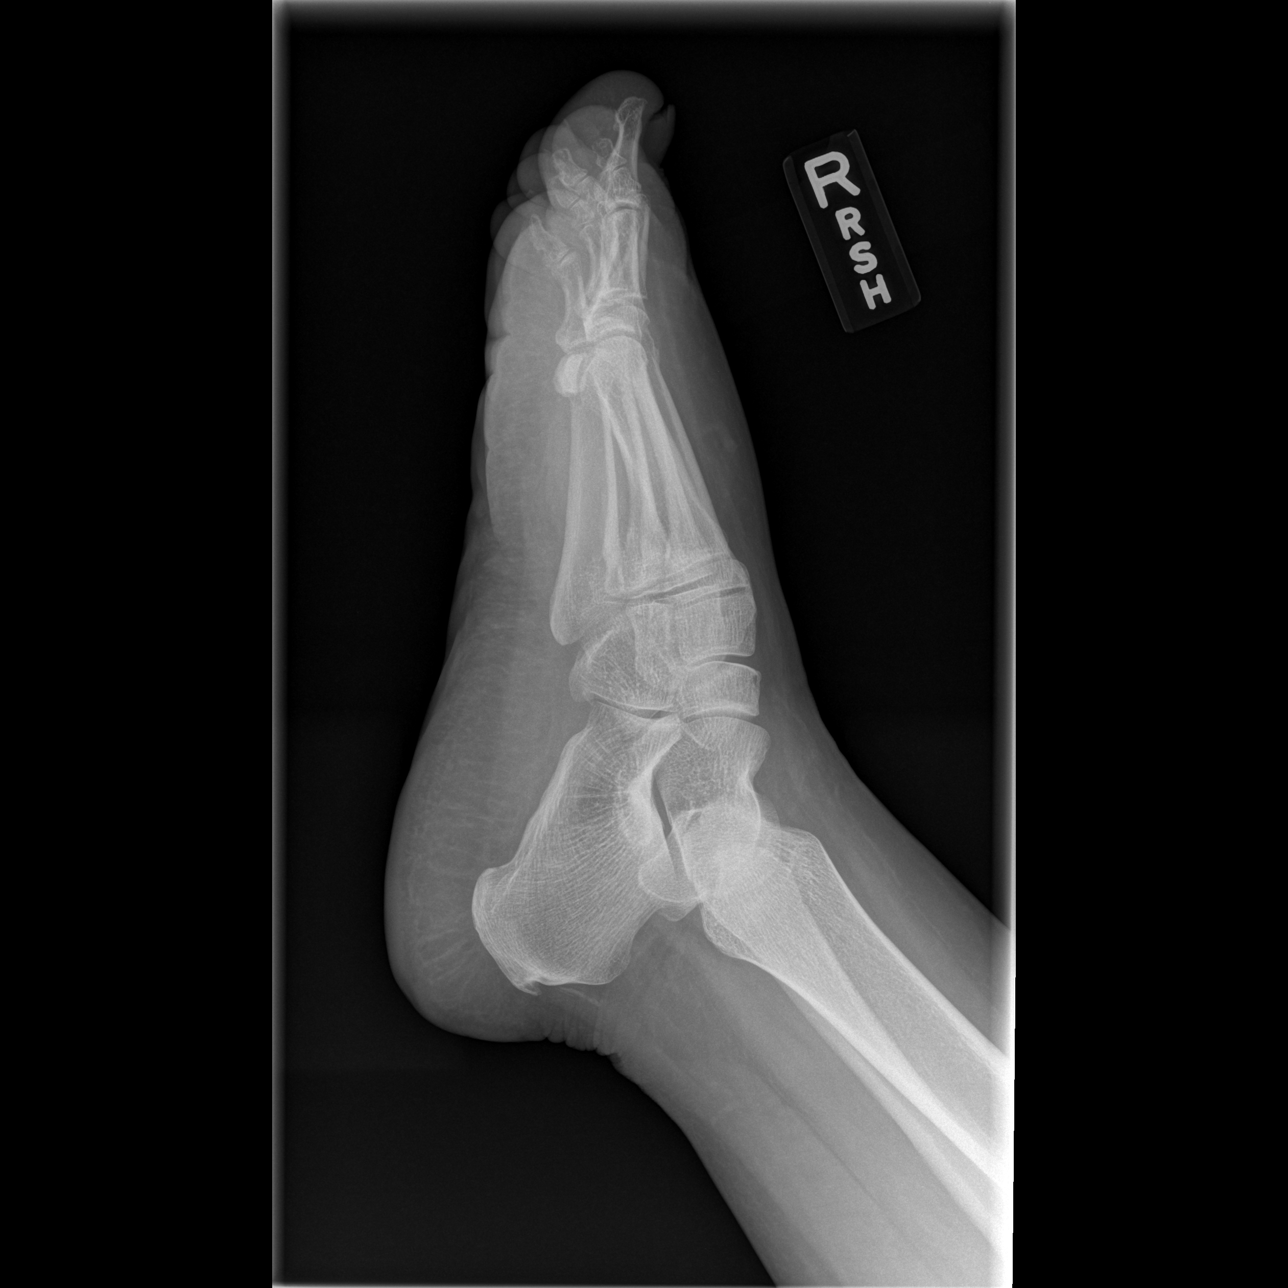

[3 of 3 positions shown; findings below may reference images not displayed]

FINDINGS: Mild diffuse soft tissue swelling. No acute fracture-dislocation.
Small Achilles spur. No radiopaque foreign object.

IMPRESSION

1. Diffuse mild soft tissue swelling without acute osseous abnormality.

## 2009-11-10 ENCOUNTER — Ambulatory Visit: Payer: Self-pay | Admitting: Oncology

## 2009-11-25 LAB — PROTIME-INR
INR: 1.9 — ABNORMAL LOW (ref 2.00–3.50)
Protime: 22.8 Seconds — ABNORMAL HIGH (ref 10.6–13.4)

## 2009-12-07 ENCOUNTER — Ambulatory Visit (HOSPITAL_COMMUNITY): Admission: RE | Admit: 2009-12-07 | Discharge: 2009-12-07 | Payer: Self-pay | Admitting: Oncology

## 2009-12-13 ENCOUNTER — Emergency Department (HOSPITAL_COMMUNITY): Admission: EM | Admit: 2009-12-13 | Discharge: 2009-12-13 | Payer: Self-pay | Admitting: Emergency Medicine

## 2009-12-31 ENCOUNTER — Ambulatory Visit: Payer: Self-pay | Admitting: Oncology

## 2010-01-03 ENCOUNTER — Encounter: Admission: RE | Admit: 2010-01-03 | Discharge: 2010-01-03 | Payer: Self-pay | Admitting: Internal Medicine

## 2010-01-04 LAB — PROTIME-INR
INR: 1.7 — ABNORMAL LOW (ref 2.00–3.50)
Protime: 20.4 Seconds — ABNORMAL HIGH (ref 10.6–13.4)

## 2010-01-27 LAB — PROTIME-INR
INR: 1.4 — ABNORMAL LOW (ref 2.00–3.50)
Protime: 16.8 Seconds — ABNORMAL HIGH (ref 10.6–13.4)

## 2010-02-07 ENCOUNTER — Ambulatory Visit: Payer: Self-pay | Admitting: Oncology

## 2010-02-10 IMAGING — MG MM SCREEN MAMMOGRAM BILATERAL
4 series · 4 of 4 positions shown · non-contrast
Comparison: none

DG SCREEN MAMMOGRAM BILATERAL
Bilateral CC and MLO view(s) were taken.

DIGITAL SCREENING MAMMOGRAM WITH CAD:
The breast tissue is heterogeneously dense.  A possible mass is noted in the left breast.  Spot 
compression views and possibly sonography are recommended for further evaluation.  In the right 
breast, no masses or malignant type calcifications are identified.

[R CC]
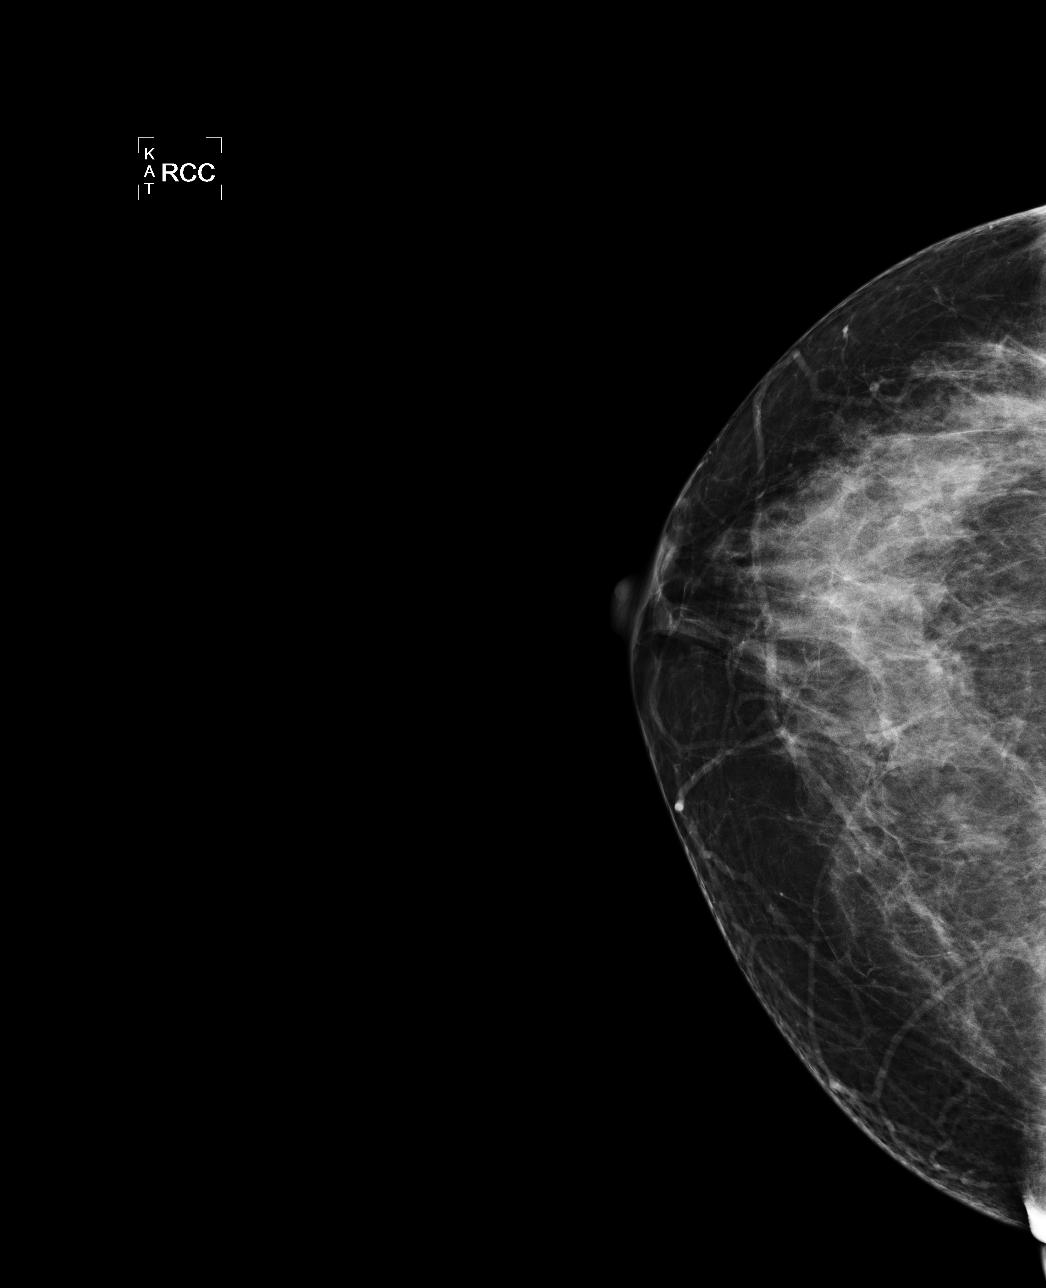

[L CC]
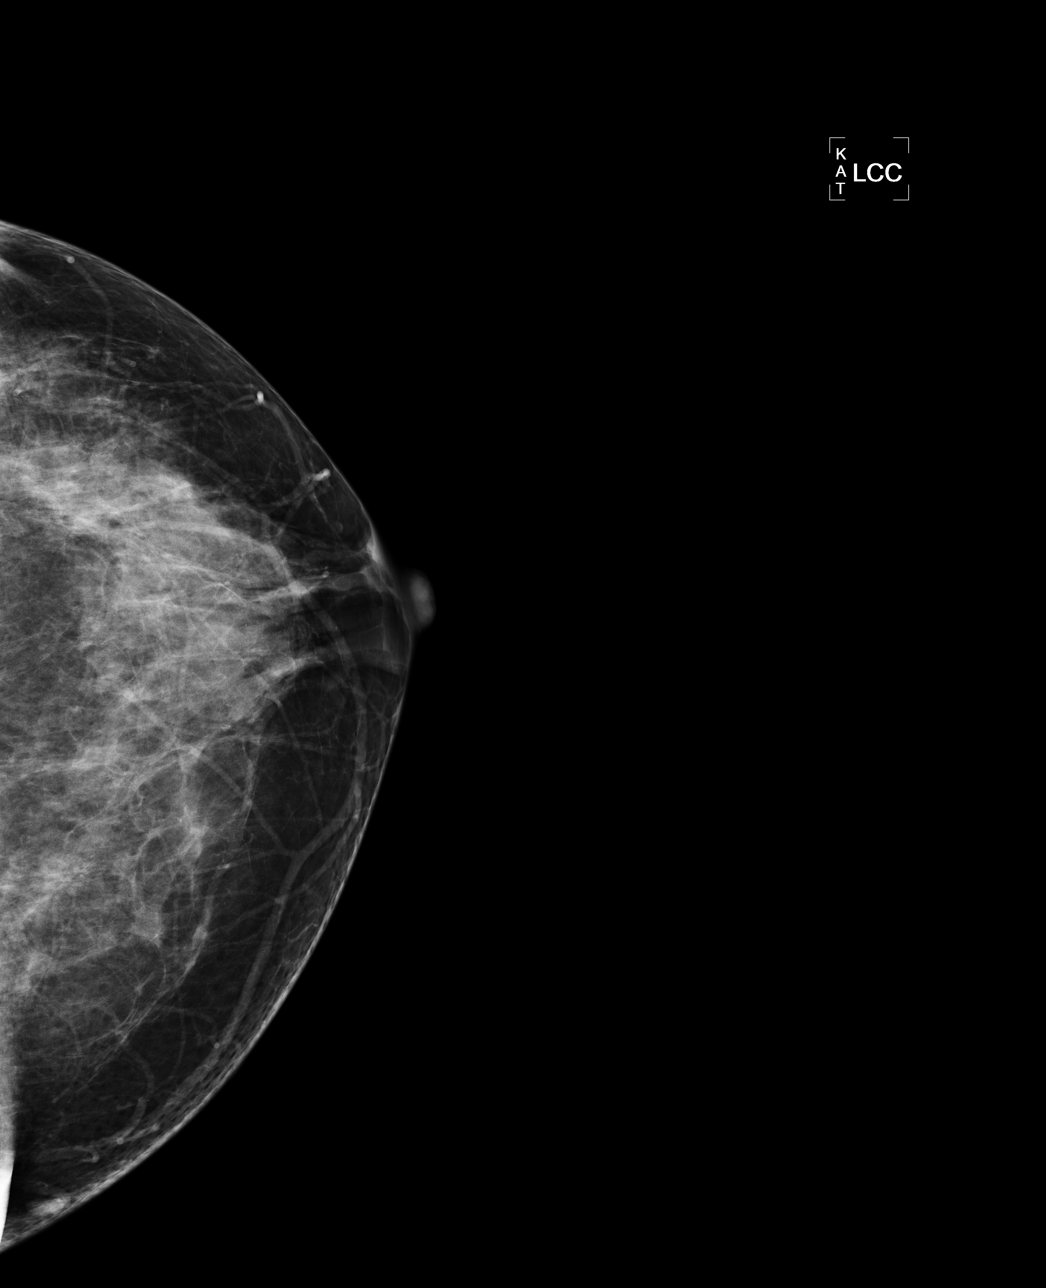

[L MLO]
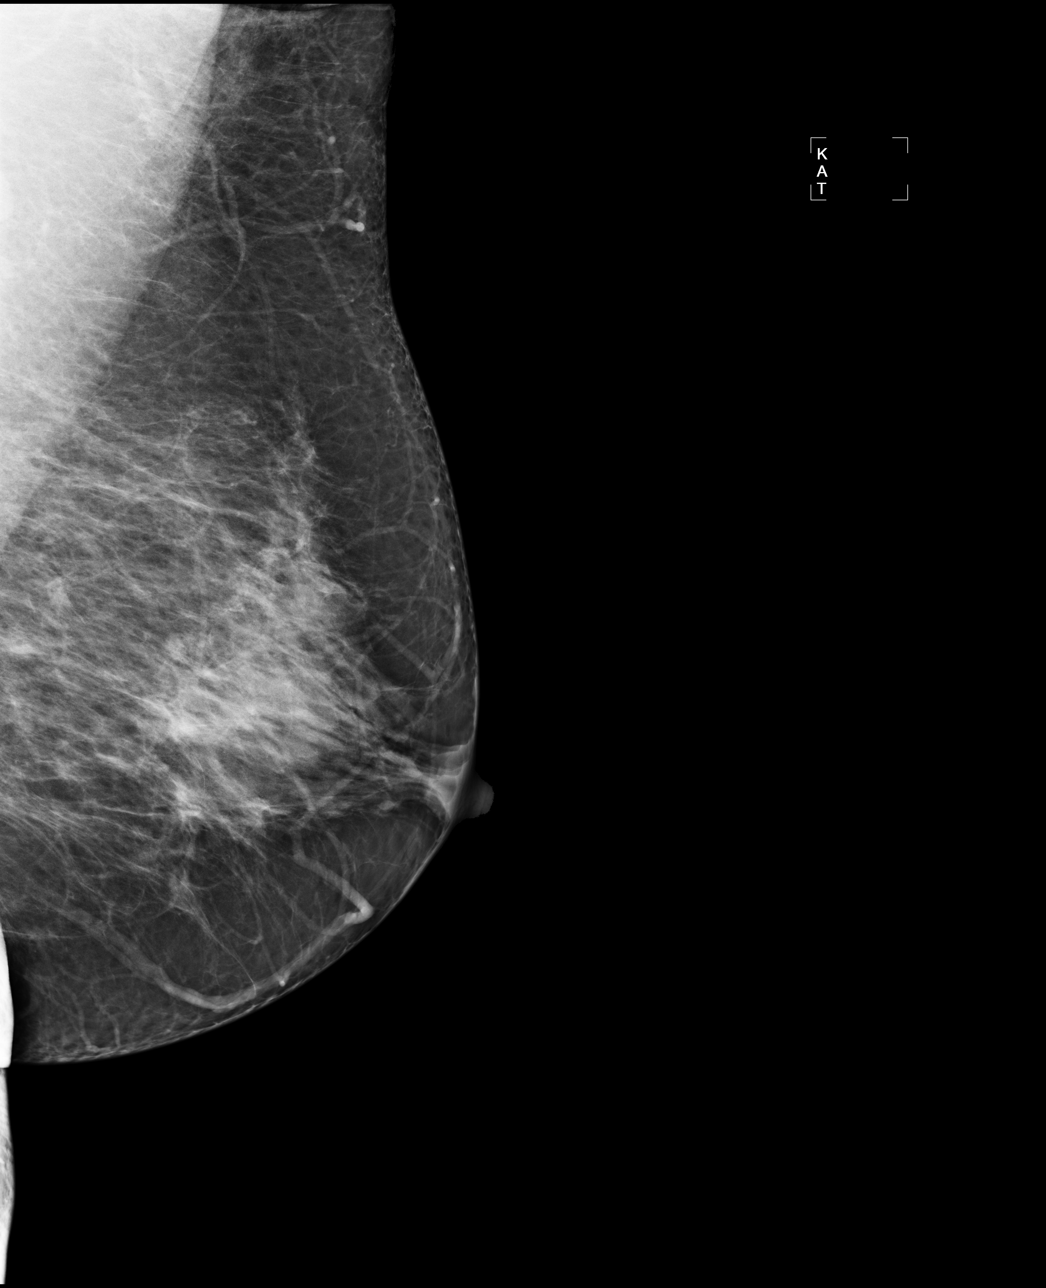

[R MLO]
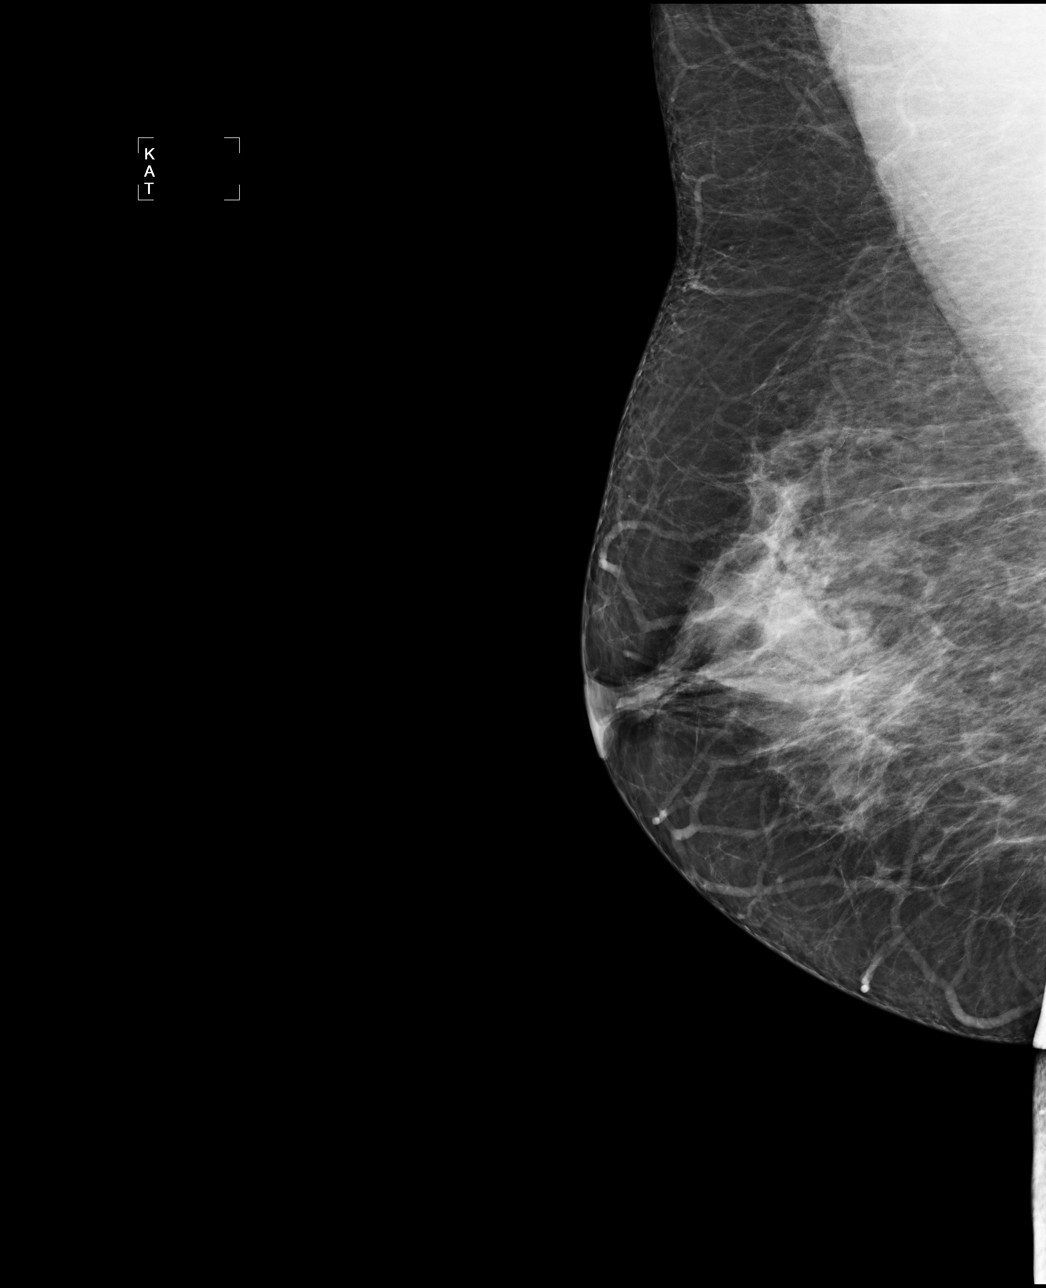

[4 of 4 positions shown; findings below may reference images not displayed]

IMPRESSION: Possible mass, left breast.  Additional evaluation is indicated.  The patient will be contacted for
additional studies and a supplementary report will follow.  No specific mammographic evidence of 
malignancy, right breast.

ASSESSMENT: Need additional imaging evaluation and/or prior mammograms for comparison - BI-RADS 0

Further imaging of the left breast.
ANALYZED BY COMPUTER AIDED DETECTION. , THIS PROCEDURE WAS A DIGITAL MAMMOGRAM.

## 2010-02-15 LAB — PROTIME-INR
INR: 3.1 (ref 2.00–3.50)
Protime: 37.2 Seconds — ABNORMAL HIGH (ref 10.6–13.4)

## 2010-02-18 IMAGING — MG MM DIAGNOSTIC LTD LEFT
2 series · 2 of 2 positions shown · non-contrast
Comparison: 08/20/2007

CLINICAL DATA: Screening callback for left breast mass

DIGITAL DIAGNOSTIC left MAMMOGRAM with CAD

[L ML]
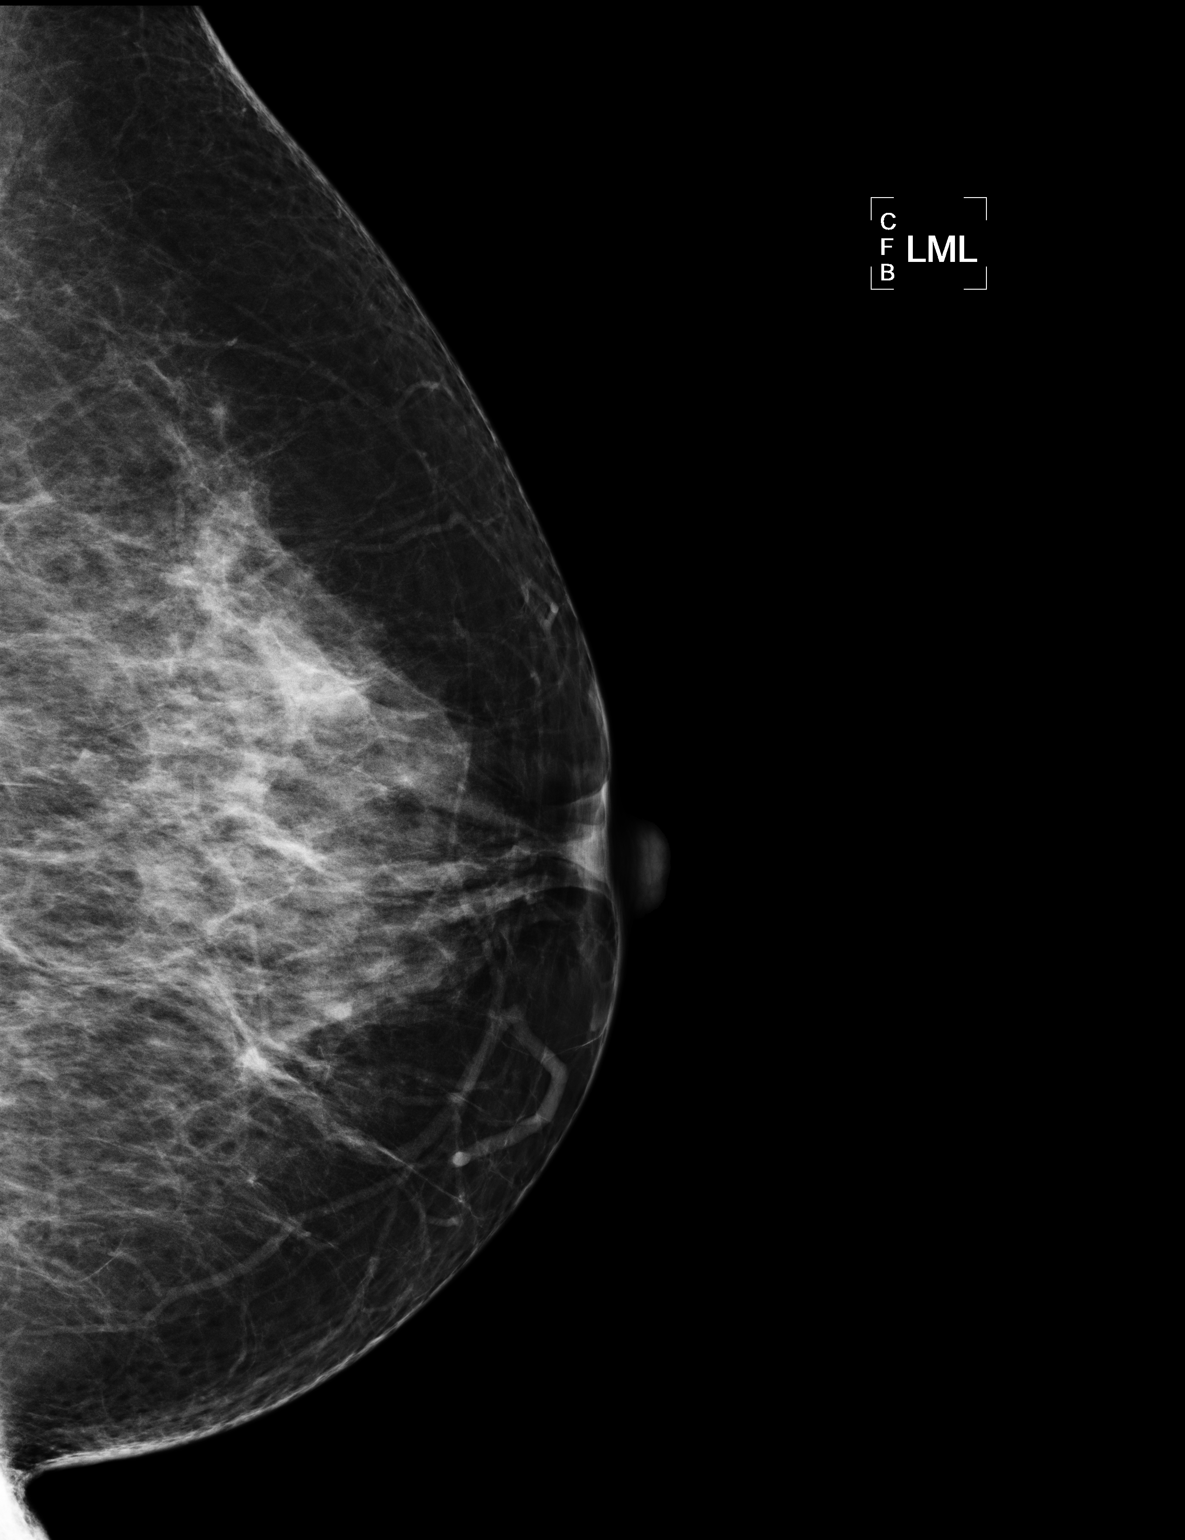

[L MLO]
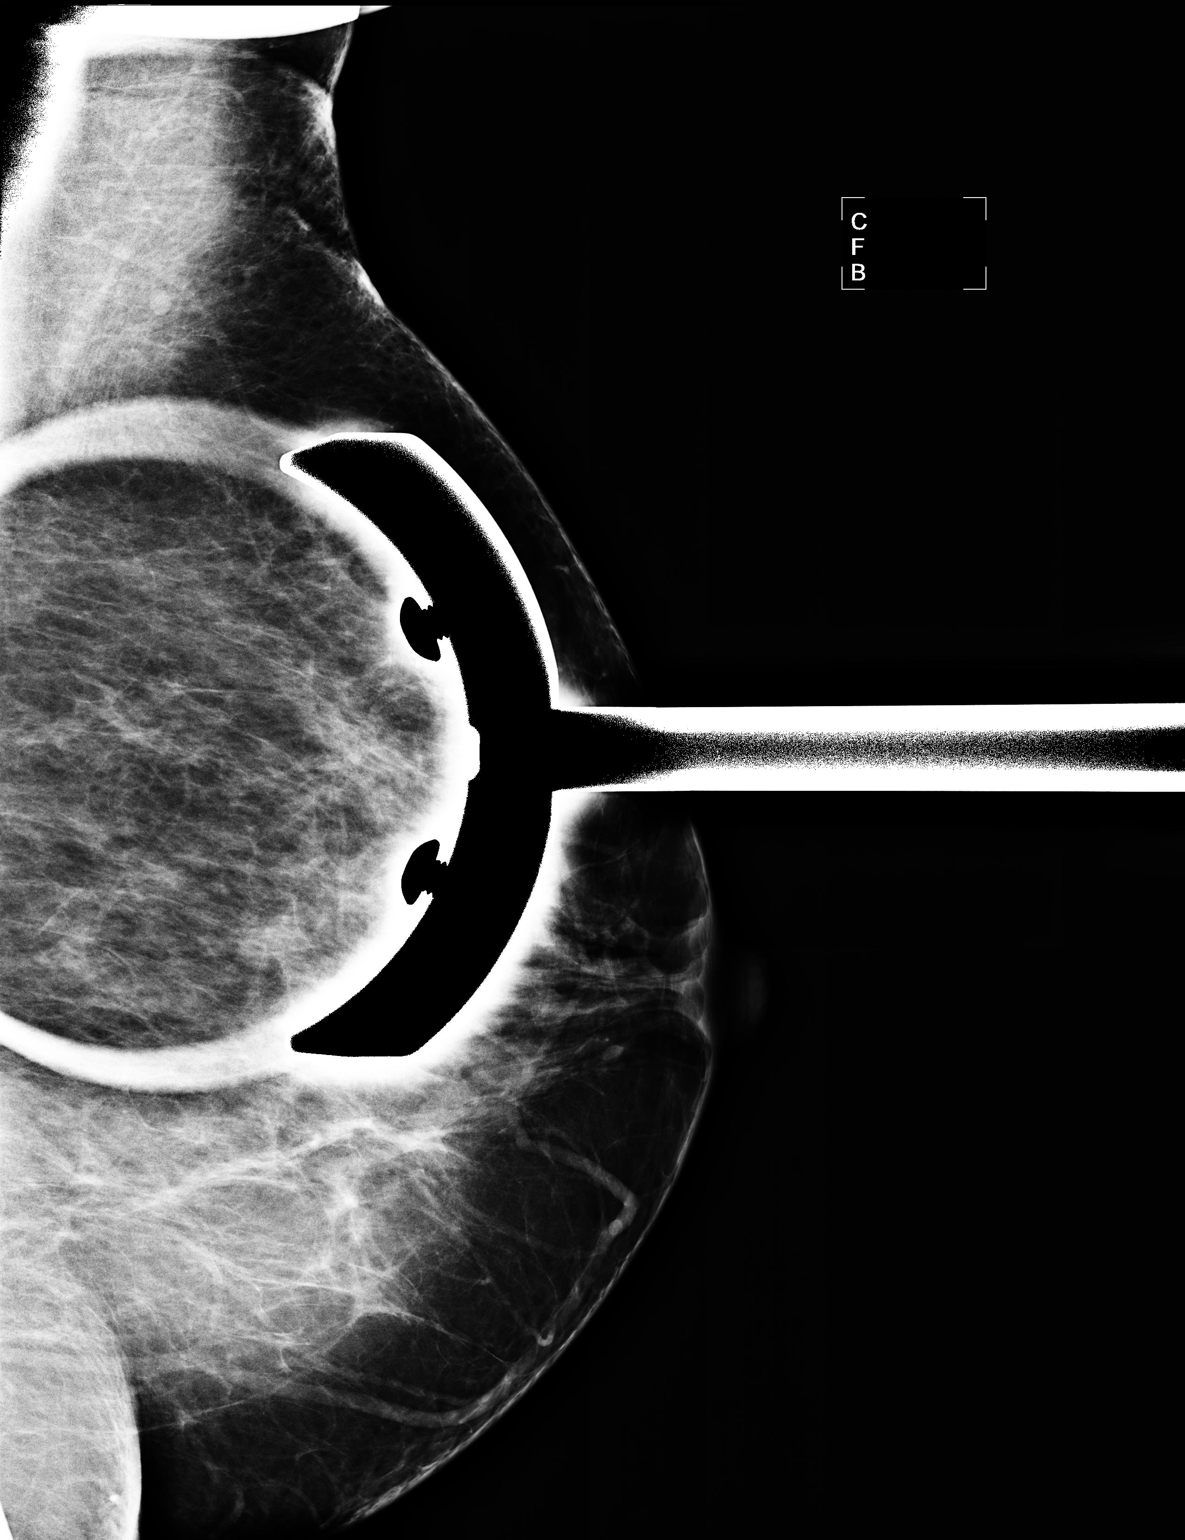

[2 of 2 positions shown; findings below may reference images not displayed]

FINDINGS: The questioned density in the left breast is not
reproduced on additional imaging.  No worrisome finding is seen.
IMPRESSION: No mammographic evidence for malignancy in the left breast.  The
questioned finding is not reproduced on additional imaging.
Screening mammography is recommended in one year. Findings and
recommendations discussed with the patient and provided in written
form at the time of the exam.

BI-RADS CATEGORY 1:  Negative.

## 2010-03-01 ENCOUNTER — Encounter
Admission: RE | Admit: 2010-03-01 | Discharge: 2010-03-01 | Payer: Self-pay | Source: Home / Self Care | Attending: Internal Medicine | Admitting: Internal Medicine

## 2010-03-01 LAB — PROTIME-INR
INR: 2.4 (ref 2.00–3.50)
Protime: 28.8 Seconds — ABNORMAL HIGH (ref 10.6–13.4)

## 2010-03-25 ENCOUNTER — Ambulatory Visit: Payer: Self-pay | Admitting: Oncology

## 2010-03-29 LAB — PROTIME-INR
INR: 2.3 (ref 2.00–3.50)
Protime: 27.6 Seconds — ABNORMAL HIGH (ref 10.6–13.4)

## 2010-04-10 ENCOUNTER — Encounter: Payer: Self-pay | Admitting: Cardiology

## 2010-04-10 ENCOUNTER — Encounter: Payer: Self-pay | Admitting: Interventional Radiology

## 2010-04-10 ENCOUNTER — Encounter: Payer: Self-pay | Admitting: Internal Medicine

## 2010-04-14 LAB — PROTIME-INR
INR: 1.19 (ref 0.00–1.49)
Prothrombin Time: 15.3 seconds — ABNORMAL HIGH (ref 11.6–15.2)

## 2010-04-14 LAB — CBC
HCT: 34.7 % — ABNORMAL LOW (ref 36.0–46.0)
Hemoglobin: 11.7 g/dL — ABNORMAL LOW (ref 12.0–15.0)
MCH: 28.1 pg (ref 26.0–34.0)
MCHC: 33.7 g/dL (ref 30.0–36.0)
MCV: 83.2 fL (ref 78.0–100.0)
Platelets: 325 10*3/uL (ref 150–400)
RBC: 4.17 MIL/uL (ref 3.87–5.11)
RDW: 16.2 % — ABNORMAL HIGH (ref 11.5–15.5)
WBC: 4.8 10*3/uL (ref 4.0–10.5)

## 2010-04-14 LAB — COMPREHENSIVE METABOLIC PANEL
ALT: 28 U/L (ref 0–35)
AST: 20 U/L (ref 0–37)
Albumin: 3.9 g/dL (ref 3.5–5.2)
Alkaline Phosphatase: 92 U/L (ref 39–117)
BUN: 12 mg/dL (ref 6–23)
CO2: 29 mEq/L (ref 19–32)
Calcium: 9.5 mg/dL (ref 8.4–10.5)
Chloride: 100 mEq/L (ref 96–112)
Creatinine, Ser: 0.77 mg/dL (ref 0.4–1.2)
GFR calc Af Amer: >60 mL/min (ref >60)
GFR calc non Af Amer: >60 mL/min (ref >60)
Glucose, Bld: 103 mg/dL — ABNORMAL HIGH (ref 70–99)
Potassium: 3.7 mEq/L (ref 3.5–5.1)
Sodium: 138 mEq/L (ref 135–145)
Total Bilirubin: 0.4 mg/dL (ref 0.3–1.2)
Total Protein: 7.8 g/dL (ref 6.0–8.3)

## 2010-04-14 LAB — SURGICAL PCR SCREEN
MRSA, PCR: NEGATIVE
Staphylococcus aureus: NEGATIVE

## 2010-04-14 LAB — DIFFERENTIAL
Basophils Absolute: 0 10*3/uL (ref 0.0–0.1)
Basophils Relative: 0 % (ref 0–1)
Eosinophils Absolute: 0.2 10*3/uL (ref 0.0–0.7)
Eosinophils Relative: 3 % (ref 0–5)
Lymphocytes Relative: 39 % (ref 12–46)
Lymphs Abs: 1.9 10*3/uL (ref 0.7–4.0)
Monocytes Absolute: 0.4 10*3/uL (ref 0.1–1.0)
Monocytes Relative: 8 % (ref 3–12)
Neutro Abs: 2.4 10*3/uL (ref 1.7–7.7)
Neutrophils Relative %: 50 % (ref 43–77)

## 2010-04-14 LAB — APTT: aPTT: 34 seconds (ref 24–37)

## 2010-04-18 ENCOUNTER — Ambulatory Visit (HOSPITAL_COMMUNITY)
Admission: RE | Admit: 2010-04-18 | Discharge: 2010-04-19 | Disposition: A | Discharge status: Home / Self Care | Payer: Medicare Other | Source: Home / Self Care | Attending: General Surgery | Admitting: General Surgery

## 2010-04-18 LAB — GLUCOSE, CAPILLARY
Glucose-Capillary: 120 mg/dL — ABNORMAL HIGH (ref 70–99)
Glucose-Capillary: 137 mg/dL — ABNORMAL HIGH (ref 70–99)
Glucose-Capillary: 129 mg/dL — ABNORMAL HIGH (ref 70–99)

## 2010-04-19 LAB — GLUCOSE, CAPILLARY: Glucose-Capillary: 134 mg/dL — ABNORMAL HIGH (ref 70–99)

## 2010-04-19 NOTE — Consult Note (Signed)
[facility]   Imported By: Roderic Ovens 12/10/2008 15:15:56  _____________________________________________________________________  External Attachment:    Type:   Image     Comment:   External Document

## 2010-04-19 NOTE — Miscellaneous (Signed)
Summary: Orders Update  Clinical Lists Changes     pt is no longer a HSE pt. advised pt to contact her pharmacy to have refill request sent to correct provider

## 2010-04-21 LAB — GLUCOSE, CAPILLARY
Glucose-Capillary: 137 mg/dL — ABNORMAL HIGH (ref 70–99)
Glucose-Capillary: 157 mg/dL — ABNORMAL HIGH (ref 70–99)
Glucose-Capillary: 158 mg/dL — ABNORMAL HIGH (ref 70–99)
Glucose-Capillary: 100 mg/dL — ABNORMAL HIGH (ref 70–99)

## 2010-05-09 NOTE — Op Note (Signed)
[name], [name]                   ACCOUNT NO.:  [id number]  MEDICAL RECORD NO.:  [id number]          PATIENT TYPE:  AMB  LOCATION:  DAY                          FACILITY:  [facility]  PHYSICIAN:  Fancy Sella. Andrey Campanile, MD     DATE OF BIRTH:  November 29, 1955  DATE OF PROCEDURE:  04/18/2010 DATE OF DISCHARGE:                              OPERATIVE REPORT   PREOPERATIVE DIAGNOSIS:  Symptomatic cholelithiasis.  POSTOPERATIVE DIAGNOSIS:  Symptomatic cholelithiasis.  PROCEDURE:  Laparoscopic cholecystectomy.  SURGEON:  Marvelene Sella. Andrey Campanile, MD  SURGEON:  Anselm Pancoast. Zachery Dakins, M.D.  ANESTHESIA:  General plus 50 cc of 0.25% Marcaine with epi.  FINDINGS:  A critical view was obtained.  She did have some adhesions in her midline from a prior surgery.  SPECIMEN:  Gallbladder which was sent to pathology.  ESTIMATED BLOOD LOSS:  Minimal.  INDICATIONS FOR PROCEDURE:  The patient is a 55 year old obese African- American female who has had intermittent right-sided abdominal pain on and off for several months.  She states that it all started in her upper abdomen and radiated to her back.  She does get bloated and nauseous at the same time.  The pain will last for several hours.  She describes it as a pressure sensation.  She does have alternating bouts of diarrhea and constipation.  She underwent an ultrasound which demonstrated numerous gallstones within her gallbladder.  We discussed the risks and benefits of surgery including bleeding, infection, injury to surrounding structures, need to convert to an open procedure, DVT occurrence, pulmonary embolism occurrence, injury to the common bile duct requiring major reconstructive bile duct surgery, prolonged diarrhea, failure to ameliorate her abdominal pain and hernia formation.  She does have a remote history of DVT and is on chronic anticoagulation because of her obesity in order to decrease her chance of recurrence.  We did give her subcutaneous dose of  heparin prior to surgery.  DESCRIPTION OF PROCEDURE:  After obtaining informed consent, the patient was brought to the operating room, placed supine on the operating room table.  General endotracheal anesthesia was established.  A surgical time-out was performed.  She received antibiotics prior to skin incision.  Her abdomen was prepped and draped in usual standard surgical fashion.  Because she had a lower midline incision, I elected to go supraumbilical.  Local was infiltrated at the top of her belly button. Next, a 1-inch vertical supraumbilical incision was made with a #11 blade.  The fascia was grasped and lifted anteriorly.  The fascia was incised with a #11 blade.  The abdominal cavity was entered.  She had some omentum stuck there but I did not feel any bowel with my finger sweep.  Pursestring suture was placed around the fascial edges consisting of 0 Vicryl UR-6 needle.  The [name] trocar was introduced and pneumoperitoneum was smoothly established up to a patient pressure of 15 mmHg.  The laparoscope was advanced.  There were some thin filmy omental adhesions there.  I was able to navigate the scope around and get up to the right upper quadrant.  She was then placed in reverse  Trendelenburg and rotated to the left.  I then placed three 5-mm trocars, one in the subxiphoid and 2 in the right hypochondrium, all under direct visualization.  I then switched the camera to the subxiphoid trocar and looked down at the umbilicus.  There was just omental attachments around the area.  I elected to leave this alone for now.  The camera was placed back in umbilical trocar.  The gallbladder was grasped with ratcheted grasper and retracted towards the right shoulder.  I then grasped the infundibulum and retracted laterally.  I then stripped some of the overlying peritoneum with both the [place] as well as with hook electrocautery to aid with mobilization.  Then using the [facility],  I was able to circumferentially dissect around the cystic duct and the cystic artery.  The critical view was obtained. These were the only 2 structures entering the gallbladder.  Three clips were placed on the downside of the cystic duct, one on the distal aspect next to the gallbladder.  Two clips were placed on the downside of the cystic artery and nothing distally.  I then used Endo shears to transect the cystic duct.  I then used hook electrocautery to transect the cystic artery distally.  We then mobilized the gallbladder up at the gallbladder fossa.  Her liver was very high in her abdominal cavity and it was little bit difficult to get traction and retraction on the gallbladder.  The gallbladder was entered with some spillage of bile, however, there is no spillage of gallstones.  The gallbladder was eventually freed.  In order to extract the gallbladder, I upsized the 5- mm subxiphoid trocar to an 11-mm trocar.  I placed the endobag through the subxiphoid trocar and placed the gallbladder within the specimen bag.  We then stitched down the specimen bag and cut it and lifted the abdominal cavity.  I then turned my attention to lysing some adhesions around the [facility] trocars so that I could extract the specimen through the umbilical trocar.  I lysed thin filmy adhesions with Endo shears without electrocautery.  There was no evidence of bowel injury.  I created an area where I could extract the specimen.  The mother-in-law grasper was then used to grasp the specimen bag and bring it up through the abdominal wall with removal of the [facility] trocar.  I tied down the previously placed pursestring suture thus obliterating the fascial defect.  However, there was still small air leak.  I placed a figure-of- eight 0 Vicryl on the UR-5 needle to completely close the fascial defect.  There was nothing within our fascial closure as visualized laparoscopically.  I then irrigated the right upper  quadrant with 1 L of saline until it was clear.  There was no evidence of  bleeding.  There was no evidence of bile leak.  Pneumoperitoneum was released and the remaining trocars were removed.  All skin incisions were closed with 4-0 Monocryl in subcuticular fashion followed by application of Benzoin and Steri-Strips and sterile bandages.  The patient was awakened and taken to recovery room in stable condition.  There were no immediate complications.  The patient tolerated the procedure well.     Zaniyah Sella. Andrey Campanile, MD     EMW/MEDQ  D:  04/18/2010  T:  04/18/2010  Job:  [id number]  cc:   Fleet Contras, M.D. Fax: [phone]  Electronically Signed by Gaynelle Adu M.D. on 05/09/2010 08:24:27 AM

## 2010-05-15 ENCOUNTER — Emergency Department (HOSPITAL_COMMUNITY)
Admission: EM | Admit: 2010-05-15 | Discharge: 2010-05-15 | Disposition: A | Discharge status: Home / Self Care | Payer: Medicare Other | Attending: Emergency Medicine | Admitting: Emergency Medicine

## 2010-05-15 ENCOUNTER — Emergency Department (HOSPITAL_COMMUNITY): Payer: Medicare Other

## 2010-05-15 DIAGNOSIS — E7800 Pure hypercholesterolemia, unspecified: Secondary | ICD-10-CM | POA: Insufficient documentation

## 2010-05-15 DIAGNOSIS — J029 Acute pharyngitis, unspecified: Secondary | ICD-10-CM | POA: Insufficient documentation

## 2010-05-15 DIAGNOSIS — I10 Essential (primary) hypertension: Secondary | ICD-10-CM | POA: Insufficient documentation

## 2010-05-15 DIAGNOSIS — K122 Cellulitis and abscess of mouth: Secondary | ICD-10-CM | POA: Insufficient documentation

## 2010-05-15 DIAGNOSIS — J45909 Unspecified asthma, uncomplicated: Secondary | ICD-10-CM | POA: Insufficient documentation

## 2010-05-17 ENCOUNTER — Other Ambulatory Visit: Payer: Self-pay | Admitting: Oncology

## 2010-05-17 ENCOUNTER — Encounter (HOSPITAL_BASED_OUTPATIENT_CLINIC_OR_DEPARTMENT_OTHER): Payer: Medicare Other | Admitting: Oncology

## 2010-05-17 DIAGNOSIS — Z86718 Personal history of other venous thrombosis and embolism: Secondary | ICD-10-CM

## 2010-05-17 DIAGNOSIS — D6859 Other primary thrombophilia: Secondary | ICD-10-CM

## 2010-05-17 DIAGNOSIS — Z7901 Long term (current) use of anticoagulants: Secondary | ICD-10-CM

## 2010-05-17 LAB — PROTIME-INR
INR: 2.1 (ref 2.00–3.50)
Protime: 25.2 Seconds — ABNORMAL HIGH (ref 10.6–13.4)

## 2010-06-02 LAB — URINALYSIS, ROUTINE W REFLEX MICROSCOPIC
Bilirubin Urine: NEGATIVE
Glucose, UA: NEGATIVE mg/dL
Hgb urine dipstick: NEGATIVE
Ketones, ur: NEGATIVE mg/dL
Nitrite: NEGATIVE
Protein, ur: NEGATIVE mg/dL
Specific Gravity, Urine: 1.021 (ref 1.005–1.030)
Urobilinogen, UA: 0.2 mg/dL (ref 0.0–1.0)
pH: 5.5 (ref 5.0–8.0)

## 2010-06-02 LAB — CREATININE, SERUM
Creatinine, Ser: 0.66 mg/dL (ref 0.4–1.2)
GFR calc Af Amer: >60 mL/min (ref >60)
GFR calc non Af Amer: >60 mL/min (ref >60)

## 2010-06-05 IMAGING — US US ABDOMEN COMPLETE
1 series · 13 of 25 positions shown · non-contrast
Comparison: CT 12/28/2005

CLINICAL DATA: Evaluate gallstones.  Right lower quadrant pain and
nausea.

ABDOMEN ULTRASOUND
TECHNIQUE: Complete abdominal ultrasound examination was performed
including evaluation of the liver, gallbladder, bile ducts,
pancreas, kidneys, spleen, IVC, and abdominal aorta.

[Series 1: us abdomen complete · 0.32mm/px · 13 of 98 slices shown]
[im 1/98]
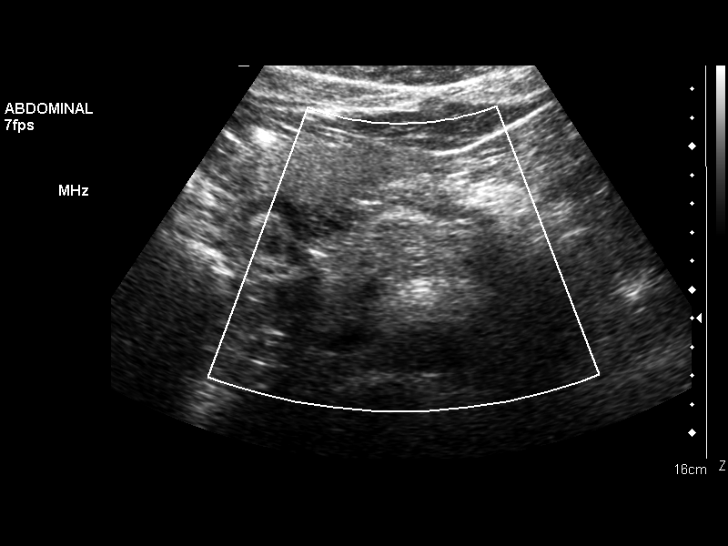
[im 9/98]
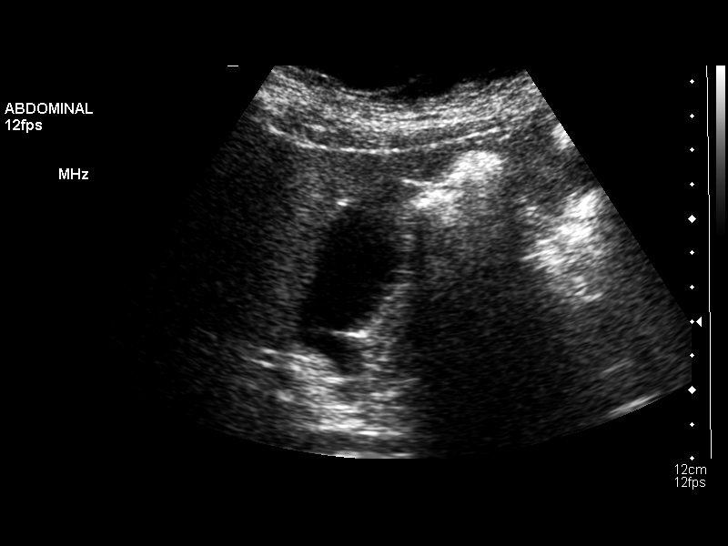
[im 17/98]
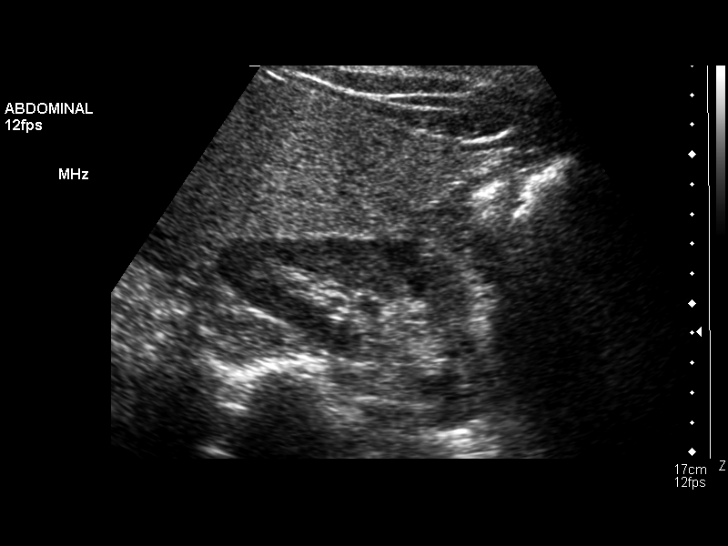
[im 25/98]
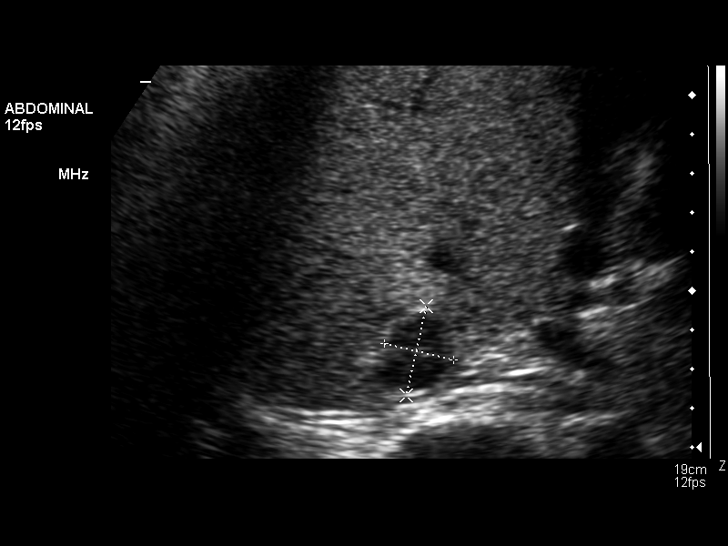
[im 33/98]
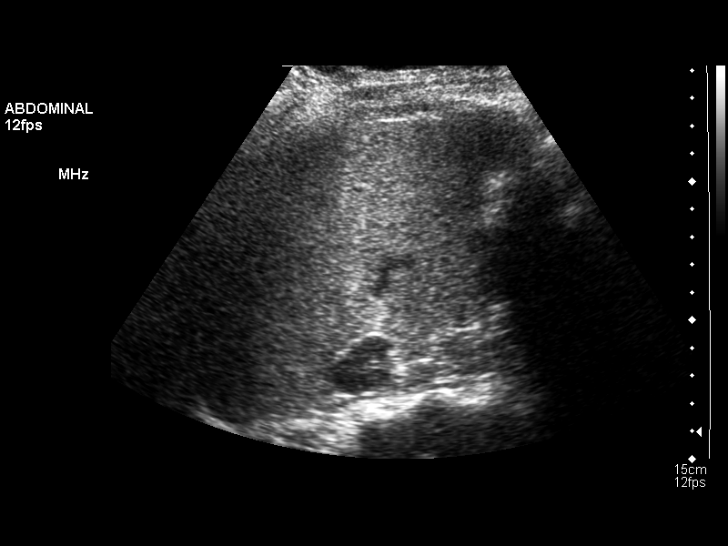
[im 41/98]
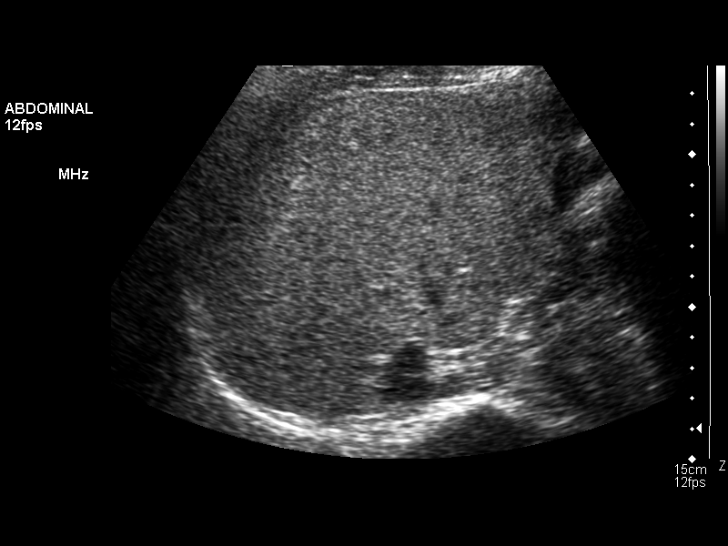
[im 49/98]
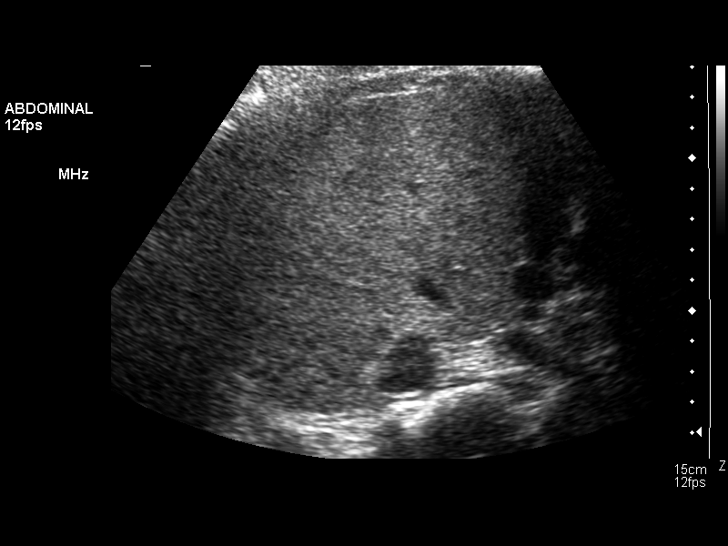
[im 57/98]
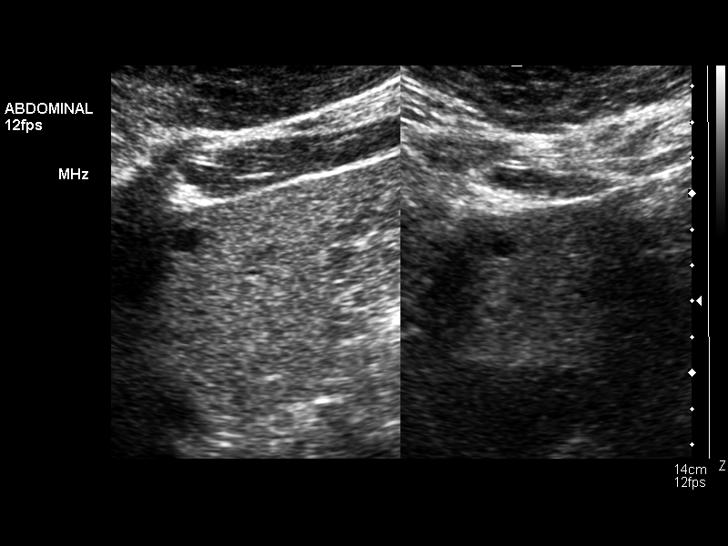
[im 65/98]
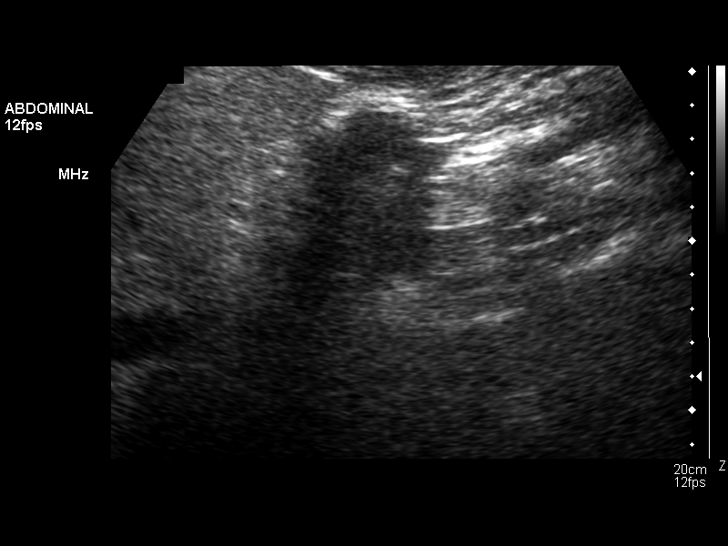
[im 73/98]
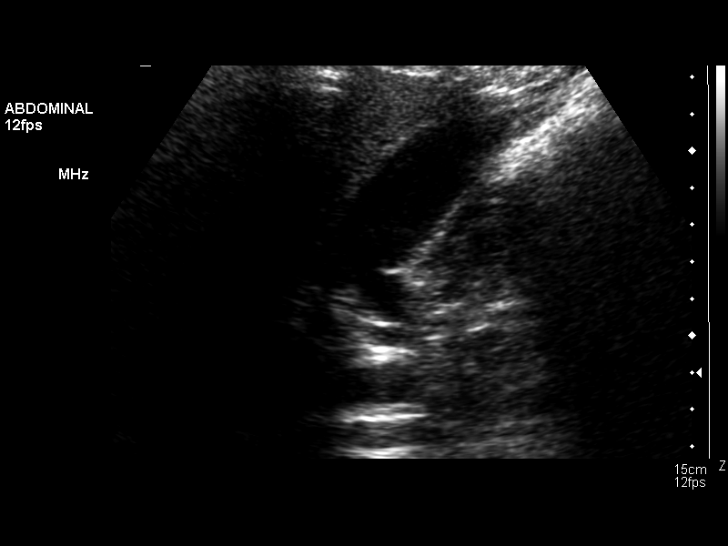
[im 81/98]
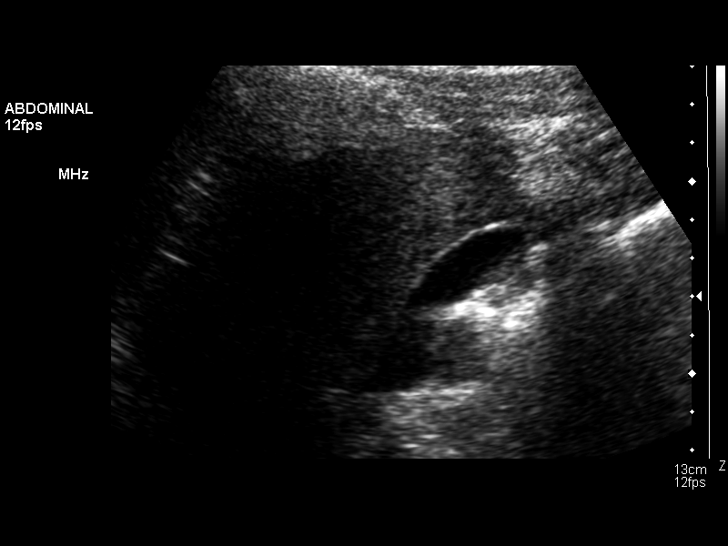
[im 89/98]
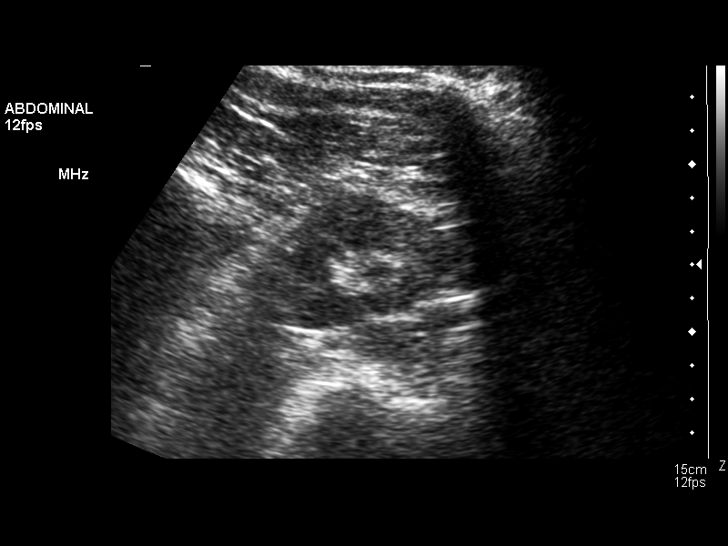
[im 98/98]
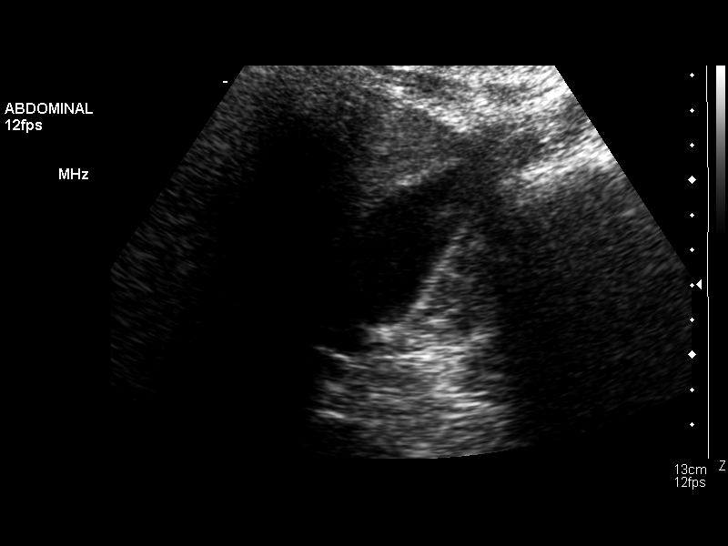

[13 of 25 positions shown; findings below may reference images not displayed]

FINDINGS: Gallbladder normal without wall thickening, stone, or
pericholecystic fluid.  Sonographic Murphy's sign not elicited.

Common duct normal at 4 mm.

Increased echogenicity of the liver most consistent with fatty
infiltration. 2  hepatic lesions most consistent with a cysts.  The
lesion near the hepatic dome measures approximately 9 mm.  More
posteriorly, lesion measures approximately 1.0 cm.

Limited evaluation of the IVC and pancreatic tail.  Spleen normal
in size and echotexture.

Right kidney 11.2 and left kidney 11.3cm.  No hydronephrosis.

Superior to the upper pole of the right kidney is a 1.8 x 2.3 x
cm hypoechoic likely solid lesion.  Images 28 and 29.  When
correlated with the CT of 12/28/2005, this may represent a adrenal
nodule which measures approximately 2.0 cm on that exam.

Limited evaluation of the proximal and distal abdominal aorta.
Visualized portions are within normal limits.  No ascites.
IMPRESSION: 1.  No acute process or explanation for right-sided abdominal pain.
2.  Fatty infiltration of the liver with probable hepatic cysts.
3. Limitations as described above.
4.  2.8 cm solid mass centered superior to the right kidney.  This
most likely represents the adrenal mass described on the 12/28/2005
CT.  An exophytic renal or liver lesion is felt less likely.  This
could either be more entirely characterized with abdominal CT
(ideally performed both with and without contrast) or followed with
ultrasound to confirm ongoing size stability.

## 2010-06-07 ENCOUNTER — Emergency Department (HOSPITAL_COMMUNITY): Payer: Medicare Other

## 2010-06-07 ENCOUNTER — Emergency Department (HOSPITAL_COMMUNITY)
Admission: EM | Admit: 2010-06-07 | Discharge: 2010-06-07 | Disposition: A | Discharge status: Home / Self Care | Payer: Medicare Other | Attending: Emergency Medicine | Admitting: Emergency Medicine

## 2010-06-07 DIAGNOSIS — F329 Major depressive disorder, single episode, unspecified: Secondary | ICD-10-CM | POA: Insufficient documentation

## 2010-06-07 DIAGNOSIS — Z79899 Other long term (current) drug therapy: Secondary | ICD-10-CM | POA: Insufficient documentation

## 2010-06-07 DIAGNOSIS — K5289 Other specified noninfective gastroenteritis and colitis: Secondary | ICD-10-CM | POA: Insufficient documentation

## 2010-06-07 DIAGNOSIS — I10 Essential (primary) hypertension: Secondary | ICD-10-CM | POA: Insufficient documentation

## 2010-06-07 DIAGNOSIS — E7800 Pure hypercholesterolemia, unspecified: Secondary | ICD-10-CM | POA: Insufficient documentation

## 2010-06-07 DIAGNOSIS — F3289 Other specified depressive episodes: Secondary | ICD-10-CM | POA: Insufficient documentation

## 2010-06-07 DIAGNOSIS — M129 Arthropathy, unspecified: Secondary | ICD-10-CM | POA: Insufficient documentation

## 2010-06-07 DIAGNOSIS — R112 Nausea with vomiting, unspecified: Secondary | ICD-10-CM | POA: Insufficient documentation

## 2010-06-07 DIAGNOSIS — Z86718 Personal history of other venous thrombosis and embolism: Secondary | ICD-10-CM | POA: Insufficient documentation

## 2010-06-07 DIAGNOSIS — R079 Chest pain, unspecified: Secondary | ICD-10-CM | POA: Insufficient documentation

## 2010-06-07 DIAGNOSIS — Z7901 Long term (current) use of anticoagulants: Secondary | ICD-10-CM | POA: Insufficient documentation

## 2010-06-07 DIAGNOSIS — R51 Headache: Secondary | ICD-10-CM | POA: Insufficient documentation

## 2010-06-07 DIAGNOSIS — J45909 Unspecified asthma, uncomplicated: Secondary | ICD-10-CM | POA: Insufficient documentation

## 2010-06-07 LAB — POCT CARDIAC MARKERS
CKMB, poc: 1.6 ng/mL (ref 1.0–8.0)
Myoglobin, poc: 62.6 ng/mL (ref 12–200)
Troponin i, poc: <0.05 ng/mL (ref 0.00–0.09)

## 2010-06-07 LAB — POCT I-STAT, CHEM 8
BUN: 12 mg/dL (ref 6–23)
Calcium, Ion: 1.07 mmol/L — ABNORMAL LOW (ref 1.12–1.32)
Chloride: 104 mEq/L (ref 96–112)
Creatinine, Ser: 0.7 mg/dL (ref 0.4–1.2)
Glucose, Bld: 126 mg/dL — ABNORMAL HIGH (ref 70–99)
HCT: 35 % — ABNORMAL LOW (ref 36.0–46.0)
Hemoglobin: 11.9 g/dL — ABNORMAL LOW (ref 12.0–15.0)
Potassium: 3.2 mEq/L — ABNORMAL LOW (ref 3.5–5.1)
Sodium: 141 mEq/L (ref 135–145)
TCO2: 25 mmol/L (ref 0–100)

## 2010-06-10 ENCOUNTER — Emergency Department (HOSPITAL_COMMUNITY)
Admission: EM | Admit: 2010-06-10 | Discharge: 2010-06-10 | Disposition: A | Discharge status: Home / Self Care | Payer: Medicare Other | Attending: Emergency Medicine | Admitting: Emergency Medicine

## 2010-06-10 ENCOUNTER — Encounter (HOSPITAL_BASED_OUTPATIENT_CLINIC_OR_DEPARTMENT_OTHER): Payer: Medicare Other | Admitting: Oncology

## 2010-06-10 ENCOUNTER — Other Ambulatory Visit: Payer: Self-pay | Admitting: Emergency Medicine

## 2010-06-10 ENCOUNTER — Other Ambulatory Visit: Payer: Self-pay | Admitting: Oncology

## 2010-06-10 DIAGNOSIS — Z86718 Personal history of other venous thrombosis and embolism: Secondary | ICD-10-CM | POA: Insufficient documentation

## 2010-06-10 DIAGNOSIS — F3289 Other specified depressive episodes: Secondary | ICD-10-CM | POA: Insufficient documentation

## 2010-06-10 DIAGNOSIS — R197 Diarrhea, unspecified: Secondary | ICD-10-CM | POA: Insufficient documentation

## 2010-06-10 DIAGNOSIS — IMO0001 Reserved for inherently not codable concepts without codable children: Secondary | ICD-10-CM | POA: Insufficient documentation

## 2010-06-10 DIAGNOSIS — I10 Essential (primary) hypertension: Secondary | ICD-10-CM | POA: Insufficient documentation

## 2010-06-10 DIAGNOSIS — Z7901 Long term (current) use of anticoagulants: Secondary | ICD-10-CM

## 2010-06-10 DIAGNOSIS — J45909 Unspecified asthma, uncomplicated: Secondary | ICD-10-CM | POA: Insufficient documentation

## 2010-06-10 DIAGNOSIS — Z5181 Encounter for therapeutic drug level monitoring: Secondary | ICD-10-CM

## 2010-06-10 DIAGNOSIS — M79609 Pain in unspecified limb: Secondary | ICD-10-CM

## 2010-06-10 DIAGNOSIS — F329 Major depressive disorder, single episode, unspecified: Secondary | ICD-10-CM | POA: Insufficient documentation

## 2010-06-10 DIAGNOSIS — D6859 Other primary thrombophilia: Secondary | ICD-10-CM

## 2010-06-10 DIAGNOSIS — E7800 Pure hypercholesterolemia, unspecified: Secondary | ICD-10-CM | POA: Insufficient documentation

## 2010-06-10 DIAGNOSIS — E876 Hypokalemia: Secondary | ICD-10-CM | POA: Insufficient documentation

## 2010-06-10 DIAGNOSIS — R112 Nausea with vomiting, unspecified: Secondary | ICD-10-CM | POA: Insufficient documentation

## 2010-06-10 LAB — CBC
HCT: 34.6 % — ABNORMAL LOW (ref 36.0–46.0)
Hemoglobin: 11.1 g/dL — ABNORMAL LOW (ref 12.0–15.0)
MCH: 26.8 pg (ref 26.0–34.0)
MCHC: 32.1 g/dL (ref 30.0–36.0)
MCV: 83.6 fL (ref 78.0–100.0)
Platelets: 307 10*3/uL (ref 150–400)
RBC: 4.14 MIL/uL (ref 3.87–5.11)
RDW: 16.7 % — ABNORMAL HIGH (ref 11.5–15.5)
WBC: 7.9 10*3/uL (ref 4.0–10.5)

## 2010-06-10 LAB — PROTIME-INR
INR: 1.2 — ABNORMAL LOW (ref 2.00–3.50)
Protime: 14.4 Seconds — ABNORMAL HIGH (ref 10.6–13.4)

## 2010-06-10 LAB — DIFFERENTIAL
Basophils Absolute: 0 10*3/uL (ref 0.0–0.1)
Basophils Relative: 0 % (ref 0–1)
Eosinophils Absolute: 0.2 10*3/uL (ref 0.0–0.7)
Eosinophils Relative: 2 % (ref 0–5)
Lymphocytes Relative: 24 % (ref 12–46)
Lymphs Abs: 1.9 10*3/uL (ref 0.7–4.0)
Monocytes Absolute: 0.6 10*3/uL (ref 0.1–1.0)
Monocytes Relative: 8 % (ref 3–12)
Neutro Abs: 5.2 10*3/uL (ref 1.7–7.7)
Neutrophils Relative %: 65 % (ref 43–77)

## 2010-06-10 LAB — COMPREHENSIVE METABOLIC PANEL
ALT: 49 U/L — ABNORMAL HIGH (ref 0–35)
AST: 40 U/L — ABNORMAL HIGH (ref 0–37)
Albumin: 4.1 g/dL (ref 3.5–5.2)
Alkaline Phosphatase: 111 U/L (ref 39–117)
BUN: 10 mg/dL (ref 6–23)
CO2: 30 mEq/L (ref 19–32)
Calcium: 9.3 mg/dL (ref 8.4–10.5)
Chloride: 103 mEq/L (ref 96–112)
Creatinine, Ser: 0.65 mg/dL (ref 0.4–1.2)
GFR calc Af Amer: >60 mL/min (ref >60)
GFR calc non Af Amer: >60 mL/min (ref >60)
Glucose, Bld: 111 mg/dL — ABNORMAL HIGH (ref 70–99)
Potassium: 3.2 mEq/L — ABNORMAL LOW (ref 3.5–5.1)
Sodium: 142 mEq/L (ref 135–145)
Total Bilirubin: 0.5 mg/dL (ref 0.3–1.2)
Total Protein: 8.2 g/dL (ref 6.0–8.3)

## 2010-06-23 ENCOUNTER — Other Ambulatory Visit: Payer: Self-pay | Admitting: Oncology

## 2010-06-23 ENCOUNTER — Encounter (HOSPITAL_BASED_OUTPATIENT_CLINIC_OR_DEPARTMENT_OTHER): Payer: Medicare Other | Admitting: Oncology

## 2010-06-23 DIAGNOSIS — Z7901 Long term (current) use of anticoagulants: Secondary | ICD-10-CM

## 2010-06-23 DIAGNOSIS — Z86718 Personal history of other venous thrombosis and embolism: Secondary | ICD-10-CM

## 2010-06-23 DIAGNOSIS — D6859 Other primary thrombophilia: Secondary | ICD-10-CM

## 2010-06-23 LAB — PROTIME-INR
INR: 2.1 (ref 2.00–3.50)
Protime: 25.2 Seconds — ABNORMAL HIGH (ref 10.6–13.4)

## 2010-06-24 LAB — GLUCOSE, CAPILLARY
Glucose-Capillary: 113 mg/dL — ABNORMAL HIGH (ref 70–99)
Glucose-Capillary: 99 mg/dL (ref 70–99)
Glucose-Capillary: 102 mg/dL — ABNORMAL HIGH (ref 70–99)
Glucose-Capillary: 108 mg/dL — ABNORMAL HIGH (ref 70–99)
Glucose-Capillary: 109 mg/dL — ABNORMAL HIGH (ref 70–99)
Glucose-Capillary: 109 mg/dL — ABNORMAL HIGH (ref 70–99)
Glucose-Capillary: 110 mg/dL — ABNORMAL HIGH (ref 70–99)
Glucose-Capillary: 110 mg/dL — ABNORMAL HIGH (ref 70–99)
Glucose-Capillary: 112 mg/dL — ABNORMAL HIGH (ref 70–99)
Glucose-Capillary: 114 mg/dL — ABNORMAL HIGH (ref 70–99)
Glucose-Capillary: 116 mg/dL — ABNORMAL HIGH (ref 70–99)
Glucose-Capillary: 116 mg/dL — ABNORMAL HIGH (ref 70–99)
Glucose-Capillary: 122 mg/dL — ABNORMAL HIGH (ref 70–99)
Glucose-Capillary: 123 mg/dL — ABNORMAL HIGH (ref 70–99)
Glucose-Capillary: 124 mg/dL — ABNORMAL HIGH (ref 70–99)
Glucose-Capillary: 127 mg/dL — ABNORMAL HIGH (ref 70–99)
Glucose-Capillary: 127 mg/dL — ABNORMAL HIGH (ref 70–99)
Glucose-Capillary: 128 mg/dL — ABNORMAL HIGH (ref 70–99)
Glucose-Capillary: 133 mg/dL — ABNORMAL HIGH (ref 70–99)
Glucose-Capillary: 134 mg/dL — ABNORMAL HIGH (ref 70–99)
Glucose-Capillary: 140 mg/dL — ABNORMAL HIGH (ref 70–99)
Glucose-Capillary: 140 mg/dL — ABNORMAL HIGH (ref 70–99)
Glucose-Capillary: 141 mg/dL — ABNORMAL HIGH (ref 70–99)
Glucose-Capillary: 145 mg/dL — ABNORMAL HIGH (ref 70–99)
Glucose-Capillary: 146 mg/dL — ABNORMAL HIGH (ref 70–99)
Glucose-Capillary: 146 mg/dL — ABNORMAL HIGH (ref 70–99)
Glucose-Capillary: 157 mg/dL — ABNORMAL HIGH (ref 70–99)
Glucose-Capillary: 158 mg/dL — ABNORMAL HIGH (ref 70–99)
Glucose-Capillary: 158 mg/dL — ABNORMAL HIGH (ref 70–99)
Glucose-Capillary: 161 mg/dL — ABNORMAL HIGH (ref 70–99)
Glucose-Capillary: 164 mg/dL — ABNORMAL HIGH (ref 70–99)
Glucose-Capillary: 222 mg/dL — ABNORMAL HIGH (ref 70–99)
Glucose-Capillary: 83 mg/dL (ref 70–99)
Glucose-Capillary: 97 mg/dL (ref 70–99)

## 2010-06-24 LAB — BASIC METABOLIC PANEL
BUN: 15 mg/dL (ref 6–23)
BUN: 6 mg/dL (ref 6–23)
BUN: 8 mg/dL (ref 6–23)
BUN: 8 mg/dL (ref 6–23)
BUN: 9 mg/dL (ref 6–23)
BUN: 9 mg/dL (ref 6–23)
CO2: 27 mEq/L (ref 19–32)
CO2: 28 mEq/L (ref 19–32)
CO2: 28 mEq/L (ref 19–32)
CO2: 28 mEq/L (ref 19–32)
CO2: 31 mEq/L (ref 19–32)
CO2: 33 mEq/L — ABNORMAL HIGH (ref 19–32)
Calcium: 9.4 mg/dL (ref 8.4–10.5)
Calcium: 8 mg/dL — ABNORMAL LOW (ref 8.4–10.5)
Calcium: 8.7 mg/dL (ref 8.4–10.5)
Calcium: 8.7 mg/dL (ref 8.4–10.5)
Calcium: 8.9 mg/dL (ref 8.4–10.5)
Calcium: 9.1 mg/dL (ref 8.4–10.5)
Chloride: 98 mEq/L (ref 96–112)
Chloride: 100 mEq/L (ref 96–112)
Chloride: 96 mEq/L (ref 96–112)
Chloride: 97 mEq/L (ref 96–112)
Chloride: 99 mEq/L (ref 96–112)
Chloride: 99 mEq/L (ref 96–112)
Creatinine, Ser: 0.94 mg/dL (ref 0.4–1.2)
Creatinine, Ser: 0.75 mg/dL (ref 0.4–1.2)
Creatinine, Ser: 0.79 mg/dL (ref 0.4–1.2)
Creatinine, Ser: 0.79 mg/dL (ref 0.4–1.2)
Creatinine, Ser: 0.8 mg/dL (ref 0.4–1.2)
Creatinine, Ser: 0.88 mg/dL (ref 0.4–1.2)
GFR calc Af Amer: >60 mL/min (ref >60)
GFR calc Af Amer: >60 mL/min (ref >60)
GFR calc Af Amer: >60 mL/min (ref >60)
GFR calc Af Amer: >60 mL/min (ref >60)
GFR calc Af Amer: >60 mL/min (ref >60)
GFR calc Af Amer: >60 mL/min (ref >60)
GFR calc non Af Amer: >60 mL/min (ref >60)
GFR calc non Af Amer: >60 mL/min (ref >60)
GFR calc non Af Amer: >60 mL/min (ref >60)
GFR calc non Af Amer: >60 mL/min (ref >60)
GFR calc non Af Amer: >60 mL/min (ref >60)
GFR calc non Af Amer: >60 mL/min (ref >60)
Glucose, Bld: 110 mg/dL — ABNORMAL HIGH (ref 70–99)
Glucose, Bld: 122 mg/dL — ABNORMAL HIGH (ref 70–99)
Glucose, Bld: 126 mg/dL — ABNORMAL HIGH (ref 70–99)
Glucose, Bld: 138 mg/dL — ABNORMAL HIGH (ref 70–99)
Glucose, Bld: 144 mg/dL — ABNORMAL HIGH (ref 70–99)
Glucose, Bld: 187 mg/dL — ABNORMAL HIGH (ref 70–99)
Potassium: 3.2 mEq/L — ABNORMAL LOW (ref 3.5–5.1)
Potassium: 3.2 mEq/L — ABNORMAL LOW (ref 3.5–5.1)
Potassium: 3.6 mEq/L (ref 3.5–5.1)
Potassium: 3.6 mEq/L (ref 3.5–5.1)
Potassium: 3.7 mEq/L (ref 3.5–5.1)
Potassium: 3.8 mEq/L (ref 3.5–5.1)
Sodium: 134 mEq/L — ABNORMAL LOW (ref 135–145)
Sodium: 132 mEq/L — ABNORMAL LOW (ref 135–145)
Sodium: 134 mEq/L — ABNORMAL LOW (ref 135–145)
Sodium: 136 mEq/L (ref 135–145)
Sodium: 137 mEq/L (ref 135–145)
Sodium: 139 mEq/L (ref 135–145)

## 2010-06-24 LAB — BLOOD GAS, ARTERIAL
Acid-Base Excess: 4.6 mmol/L — ABNORMAL HIGH (ref 0.0–2.0)
Bicarbonate: 28.8 mEq/L — ABNORMAL HIGH (ref 20.0–24.0)
Drawn by: 249101
O2 Content: 2 L/min
O2 Saturation: 97.4 %
Patient temperature: 98.6
TCO2: 30.1 mmol/L (ref 0–100)
pCO2 arterial: 44 mmHg (ref 35.0–45.0)
pH, Arterial: 7.431 — ABNORMAL HIGH (ref 7.350–7.400)
pO2, Arterial: 86 mmHg (ref 80.0–100.0)

## 2010-06-24 LAB — CBC
HCT: 35.1 % — ABNORMAL LOW (ref 36.0–46.0)
HCT: 26.7 % — ABNORMAL LOW (ref 36.0–46.0)
HCT: 26.8 % — ABNORMAL LOW (ref 36.0–46.0)
HCT: 28.6 % — ABNORMAL LOW (ref 36.0–46.0)
HCT: 29.6 % — ABNORMAL LOW (ref 36.0–46.0)
Hemoglobin: 11.6 g/dL — ABNORMAL LOW (ref 12.0–15.0)
Hemoglobin: 8.9 g/dL — ABNORMAL LOW (ref 12.0–15.0)
Hemoglobin: 8.9 g/dL — ABNORMAL LOW (ref 12.0–15.0)
Hemoglobin: 9.6 g/dL — ABNORMAL LOW (ref 12.0–15.0)
Hemoglobin: 9.9 g/dL — ABNORMAL LOW (ref 12.0–15.0)
MCHC: 32.9 g/dL (ref 30.0–36.0)
MCHC: 33.3 g/dL (ref 30.0–36.0)
MCHC: 33.4 g/dL (ref 30.0–36.0)
MCHC: 33.4 g/dL (ref 30.0–36.0)
MCHC: 33.6 g/dL (ref 30.0–36.0)
MCV: 86.7 fL (ref 78.0–100.0)
MCV: 86.6 fL (ref 78.0–100.0)
MCV: 86.6 fL (ref 78.0–100.0)
MCV: 86.7 fL (ref 78.0–100.0)
MCV: 88 fL (ref 78.0–100.0)
Platelets: 284 10*3/uL (ref 150–400)
Platelets: 185 10*3/uL (ref 150–400)
Platelets: 202 10*3/uL (ref 150–400)
Platelets: 242 10*3/uL (ref 150–400)
Platelets: 261 10*3/uL (ref 150–400)
RBC: 4.05 MIL/uL (ref 3.87–5.11)
RBC: 3.08 MIL/uL — ABNORMAL LOW (ref 3.87–5.11)
RBC: 3.09 MIL/uL — ABNORMAL LOW (ref 3.87–5.11)
RBC: 3.25 MIL/uL — ABNORMAL LOW (ref 3.87–5.11)
RBC: 3.41 MIL/uL — ABNORMAL LOW (ref 3.87–5.11)
RDW: 18.3 % — ABNORMAL HIGH (ref 11.5–15.5)
RDW: 17.1 % — ABNORMAL HIGH (ref 11.5–15.5)
RDW: 18 % — ABNORMAL HIGH (ref 11.5–15.5)
RDW: 18.1 % — ABNORMAL HIGH (ref 11.5–15.5)
RDW: 18.1 % — ABNORMAL HIGH (ref 11.5–15.5)
WBC: 5.8 10*3/uL (ref 4.0–10.5)
WBC: 7.2 10*3/uL (ref 4.0–10.5)
WBC: 8.1 10*3/uL (ref 4.0–10.5)
WBC: 8.3 10*3/uL (ref 4.0–10.5)
WBC: 9.3 10*3/uL (ref 4.0–10.5)

## 2010-06-24 LAB — CARDIAC PANEL(CRET KIN+CKTOT+MB+TROPI)
CK, MB: 4.6 ng/mL — ABNORMAL HIGH (ref 0.3–4.0)
CK, MB: 7.1 ng/mL — ABNORMAL HIGH (ref 0.3–4.0)
CK, MB: 8.3 ng/mL — ABNORMAL HIGH (ref 0.3–4.0)
CK, MB: 8.6 ng/mL — ABNORMAL HIGH (ref 0.3–4.0)
Relative Index: 1.4 (ref 0.0–2.5)
Relative Index: 1.5 (ref 0.0–2.5)
Relative Index: 1.7 (ref 0.0–2.5)
Relative Index: 1.7 (ref 0.0–2.5)
Total CK: 324 U/L — ABNORMAL HIGH (ref 7–177)
Total CK: 465 U/L — ABNORMAL HIGH (ref 7–177)
Total CK: 501 U/L — ABNORMAL HIGH (ref 7–177)
Total CK: 510 U/L — ABNORMAL HIGH (ref 7–177)
Troponin I: 0.01 ng/mL (ref 0.00–0.06)
Troponin I: 0.01 ng/mL (ref 0.00–0.06)
Troponin I: 0.02 ng/mL (ref 0.00–0.06)
Troponin I: 0.02 ng/mL (ref 0.00–0.06)

## 2010-06-24 LAB — CROSSMATCH
ABO/RH(D): A POS
Antibody Screen: POSITIVE
DAT, IgG: NEGATIVE
Donor AG Type: NEGATIVE

## 2010-06-24 LAB — PROTIME-INR
INR: 1.1 (ref 0.00–1.49)
INR: 1 (ref 0.00–1.49)
INR: 1.2 (ref 0.00–1.49)
INR: 1.5 (ref 0.00–1.49)
INR: 1.5 (ref 0.00–1.49)
INR: 1.5 (ref 0.00–1.49)
INR: 1.7 — ABNORMAL HIGH (ref 0.00–1.49)
INR: 2 — ABNORMAL HIGH (ref 0.00–1.49)
INR: 2.7 — ABNORMAL HIGH (ref 0.00–1.49)
Prothrombin Time: 13.9 seconds (ref 11.6–15.2)
Prothrombin Time: 13.4 seconds (ref 11.6–15.2)
Prothrombin Time: 15.4 seconds — ABNORMAL HIGH (ref 11.6–15.2)
Prothrombin Time: 17.5 seconds — ABNORMAL HIGH (ref 11.6–15.2)
Prothrombin Time: 17.7 seconds — ABNORMAL HIGH (ref 11.6–15.2)
Prothrombin Time: 18.2 seconds — ABNORMAL HIGH (ref 11.6–15.2)
Prothrombin Time: 19.5 seconds — ABNORMAL HIGH (ref 11.6–15.2)
Prothrombin Time: 22.2 seconds — ABNORMAL HIGH (ref 11.6–15.2)
Prothrombin Time: 28.1 seconds — ABNORMAL HIGH (ref 11.6–15.2)

## 2010-06-24 LAB — HEMOGLOBIN A1C
Hgb A1c MFr Bld: 7.5 % — ABNORMAL HIGH (ref 4.6–6.1)
Mean Plasma Glucose: 169 mg/dL

## 2010-06-24 LAB — APTT: aPTT: 27 seconds (ref 24–37)

## 2010-06-24 LAB — HEMOGLOBIN AND HEMATOCRIT, BLOOD
HCT: 25.9 % — ABNORMAL LOW (ref 36.0–46.0)
HCT: 27.8 % — ABNORMAL LOW (ref 36.0–46.0)
Hemoglobin: 8.7 g/dL — ABNORMAL LOW (ref 12.0–15.0)
Hemoglobin: 9.5 g/dL — ABNORMAL LOW (ref 12.0–15.0)

## 2010-06-24 LAB — BRAIN NATRIURETIC PEPTIDE: Pro B Natriuretic peptide (BNP): <30 pg/mL (ref 0.0–100.0)

## 2010-06-24 LAB — D-DIMER, QUANTITATIVE: D-Dimer, Quant: 3.96 ug/mL-FEU — ABNORMAL HIGH (ref 0.00–0.48)

## 2010-06-24 LAB — CK: Total CK: 513 U/L — ABNORMAL HIGH (ref 7–177)

## 2010-06-24 LAB — TROPONIN I: Troponin I: 0.02 ng/mL (ref 0.00–0.06)

## 2010-06-25 LAB — CBC
HCT: 36.7 % (ref 36.0–46.0)
Hemoglobin: 12 g/dL (ref 12.0–15.0)
MCHC: 32.8 g/dL (ref 30.0–36.0)
MCV: 86.9 fL (ref 78.0–100.0)
Platelets: 278 10*3/uL (ref 150–400)
RBC: 4.22 MIL/uL (ref 3.87–5.11)
RDW: 17.8 % — ABNORMAL HIGH (ref 11.5–15.5)
WBC: 5.6 10*3/uL (ref 4.0–10.5)

## 2010-06-25 LAB — TYPE AND SCREEN
ABO/RH(D): A POS
Antibody Screen: POSITIVE
DAT, IgG: NEGATIVE
Donor AG Type: NEGATIVE
Donor AG Type: NEGATIVE
PT AG Type: NEGATIVE

## 2010-06-25 LAB — URINALYSIS, ROUTINE W REFLEX MICROSCOPIC
Bilirubin Urine: NEGATIVE
Glucose, UA: NEGATIVE mg/dL
Hgb urine dipstick: NEGATIVE
Ketones, ur: NEGATIVE mg/dL
Nitrite: NEGATIVE
Protein, ur: NEGATIVE mg/dL
Specific Gravity, Urine: 1.021 (ref 1.005–1.030)
Urobilinogen, UA: 1 mg/dL (ref 0.0–1.0)
pH: 5.5 (ref 5.0–8.0)

## 2010-06-25 LAB — COMPREHENSIVE METABOLIC PANEL
ALT: 55 U/L — ABNORMAL HIGH (ref 0–35)
AST: 40 U/L — ABNORMAL HIGH (ref 0–37)
Albumin: 4.2 g/dL (ref 3.5–5.2)
Alkaline Phosphatase: 117 U/L (ref 39–117)
BUN: 8 mg/dL (ref 6–23)
CO2: 30 mEq/L (ref 19–32)
Calcium: 9.7 mg/dL (ref 8.4–10.5)
Chloride: 98 mEq/L (ref 96–112)
Creatinine, Ser: 0.78 mg/dL (ref 0.4–1.2)
GFR calc Af Amer: >60 mL/min (ref >60)
GFR calc non Af Amer: >60 mL/min (ref >60)
Glucose, Bld: 86 mg/dL (ref 70–99)
Potassium: 3.6 mEq/L (ref 3.5–5.1)
Sodium: 137 mEq/L (ref 135–145)
Total Bilirubin: 0.8 mg/dL (ref 0.3–1.2)
Total Protein: 8 g/dL (ref 6.0–8.3)

## 2010-06-25 LAB — GLUCOSE, CAPILLARY
Glucose-Capillary: 105 mg/dL — ABNORMAL HIGH (ref 70–99)
Glucose-Capillary: 151 mg/dL — ABNORMAL HIGH (ref 70–99)
Glucose-Capillary: 207 mg/dL — ABNORMAL HIGH (ref 70–99)
Glucose-Capillary: 95 mg/dL (ref 70–99)
Glucose-Capillary: 97 mg/dL (ref 70–99)

## 2010-06-25 LAB — APTT: aPTT: 30 seconds (ref 24–37)

## 2010-06-25 LAB — DIFFERENTIAL
Basophils Absolute: 0 10*3/uL (ref 0.0–0.1)
Basophils Relative: 0 % (ref 0–1)
Eosinophils Absolute: 0.1 10*3/uL (ref 0.0–0.7)
Eosinophils Relative: 2 % (ref 0–5)
Lymphocytes Relative: 38 % (ref 12–46)
Lymphs Abs: 2.1 10*3/uL (ref 0.7–4.0)
Monocytes Absolute: 0.5 10*3/uL (ref 0.1–1.0)
Monocytes Relative: 9 % (ref 3–12)
Neutro Abs: 2.8 10*3/uL (ref 1.7–7.7)
Neutrophils Relative %: 51 % (ref 43–77)

## 2010-06-25 LAB — URINE CULTURE
Colony Count: NO GROWTH
Culture: NO GROWTH

## 2010-06-25 LAB — PROTIME-INR
INR: 1.1 (ref 0.00–1.49)
Prothrombin Time: 14.3 seconds (ref 11.6–15.2)

## 2010-06-29 LAB — CBC
HCT: 34.8 % — ABNORMAL LOW (ref 36.0–46.0)
Hemoglobin: 11.8 g/dL — ABNORMAL LOW (ref 12.0–15.0)
MCHC: 33.8 g/dL (ref 30.0–36.0)
MCV: 84.1 fL (ref 78.0–100.0)
Platelets: 244 10*3/uL (ref 150–400)
RBC: 4.13 MIL/uL (ref 3.87–5.11)
RDW: 18.7 % — ABNORMAL HIGH (ref 11.5–15.5)
WBC: 4.7 10*3/uL (ref 4.0–10.5)

## 2010-06-29 LAB — DIFFERENTIAL
Basophils Absolute: 0 10*3/uL (ref 0.0–0.1)
Basophils Relative: 0 % (ref 0–1)
Eosinophils Absolute: 0.3 10*3/uL (ref 0.0–0.7)
Eosinophils Relative: 6 % — ABNORMAL HIGH (ref 0–5)
Lymphocytes Relative: 24 % (ref 12–46)
Lymphs Abs: 1.1 10*3/uL (ref 0.7–4.0)
Monocytes Absolute: 0.7 10*3/uL (ref 0.1–1.0)
Monocytes Relative: 15 % — ABNORMAL HIGH (ref 3–12)
Neutro Abs: 2.6 10*3/uL (ref 1.7–7.7)
Neutrophils Relative %: 55 % (ref 43–77)

## 2010-06-29 LAB — COMPREHENSIVE METABOLIC PANEL
ALT: 31 U/L (ref 0–35)
AST: 30 U/L (ref 0–37)
Albumin: 3.8 g/dL (ref 3.5–5.2)
Alkaline Phosphatase: 95 U/L (ref 39–117)
BUN: 5 mg/dL — ABNORMAL LOW (ref 6–23)
CO2: 30 mEq/L (ref 19–32)
Calcium: 9.5 mg/dL (ref 8.4–10.5)
Chloride: 102 mEq/L (ref 96–112)
Creatinine, Ser: 0.73 mg/dL (ref 0.4–1.2)
GFR calc Af Amer: >60 mL/min (ref >60)
GFR calc non Af Amer: >60 mL/min (ref >60)
Glucose, Bld: 119 mg/dL — ABNORMAL HIGH (ref 70–99)
Potassium: 3 mEq/L — ABNORMAL LOW (ref 3.5–5.1)
Sodium: 141 mEq/L (ref 135–145)
Total Bilirubin: 0.6 mg/dL (ref 0.3–1.2)
Total Protein: 7.4 g/dL (ref 6.0–8.3)

## 2010-06-29 LAB — D-DIMER, QUANTITATIVE: D-Dimer, Quant: 0.53 ug/mL-FEU — ABNORMAL HIGH (ref 0.00–0.48)

## 2010-06-29 LAB — BRAIN NATRIURETIC PEPTIDE: Pro B Natriuretic peptide (BNP): <30 pg/mL (ref 0.0–100.0)

## 2010-06-29 LAB — PROTIME-INR
INR: 1 (ref 0.00–1.49)
Prothrombin Time: 13.9 seconds (ref 11.6–15.2)

## 2010-06-29 LAB — APTT: aPTT: 33 seconds (ref 24–37)

## 2010-06-29 LAB — LIPASE, BLOOD: Lipase: 21 U/L (ref 11–59)

## 2010-07-25 ENCOUNTER — Encounter (HOSPITAL_BASED_OUTPATIENT_CLINIC_OR_DEPARTMENT_OTHER): Payer: Medicare Other | Admitting: Oncology

## 2010-07-25 ENCOUNTER — Other Ambulatory Visit: Payer: Self-pay | Admitting: Oncology

## 2010-07-25 DIAGNOSIS — Z86718 Personal history of other venous thrombosis and embolism: Secondary | ICD-10-CM

## 2010-07-25 DIAGNOSIS — Z7901 Long term (current) use of anticoagulants: Secondary | ICD-10-CM

## 2010-07-25 DIAGNOSIS — D6859 Other primary thrombophilia: Secondary | ICD-10-CM

## 2010-07-25 LAB — PROTIME-INR
INR: 2.6 (ref 2.00–3.50)
Protime: 31.2 Seconds — ABNORMAL HIGH (ref 10.6–13.4)

## 2010-08-02 NOTE — Op Note (Signed)
NAMESALLIE, STARON                   ACCOUNT NO.:  [id number]   MEDICAL RECORD NO.:  [id number]          PATIENT TYPE:  INP   LOCATION:  0454                         FACILITY:  [facility]   PHYSICIAN:  Burnard Bunting, M.D.    DATE OF BIRTH:  03-15-1956   DATE OF PROCEDURE:  11/17/2008  DATE OF DISCHARGE:                               OPERATIVE REPORT   PREOPERATIVE DIAGNOSIS:  Left knee arthritis.   POSTOPERATIVE DIAGNOSIS:  Left knee arthritis.   PROCEDURE:  Left total knee replacement.   SURGEON:  Burnard Bunting, MD   ASSISTANT:  Jerolyn Shin. Lavender, MD   ANESTHESIA:  General endotracheal.   ESTIMATED BLOOD LOSS:  150.   DRAINS:  Hemovac x1.   INDICATIONS:  [name] os a 55 year old patient with end-stage  arthritis of the left knee who presents for operative management after  explanation of risks and benefits.   COMPONENTS INSERTED:  Cemented DePuy rotating platform size 3 tibia, 3  peg, 32 patella, TENS spacer, size 4 with a size 4 narrow femur.   PROCEDURE IN DETAIL:  The patient was brought to the operating room  where general endotracheal incisions.  Preoperative antibiotics were  administered.  The left leg was prepped including the foot after pre-  scrubbing with alcohol and Betadine swab.  Air dried with DuraPrep  solution and draped in a sterile manner.  [name] was used to cover the  operative field.  The leg was elevated and exsanguinated with the  Esmarch wrap, tourniquet was inflated.  Total time 1 hour 45 minutes.  Incision was then made.  Skin and subcutaneous tissue were sharply  divided.  Anterior approach to the knee was made.  Median parapatellar  arthrotomy was then performed.  Precise location was marked with #1  Vicryl suture.  Patella was everted.  Lateral patellofemoral ligament  was released.  Fat pad was partially excised.  Tissue in the anterior  distal femur was removed.  Periosteal elevation of the medial soft  tissue sleeve was performed in order to  facilitate exposure.  At this  time, 2 guide pins were placed in the proximal tibia and distal femur  for computer assistance.  Points were obtained around the ankle and  knee.  The tibia was then cut with posterior neurovascular structures  well protected and the collateral ligament was protected.  A cut was  made perpendicular and mechanical axis.  Tensioner was then placed in  extension and flexion intraoperative computer-assisted templating the  femoral cut was made and checked with the spacer block.  Chamfer cuts  were then performed.  Box cut was made.  Tibia was keel punched.  Trial  reduction was performed with size 3 tibia, 4 femur, 10 poly.  The  patient had full extension, full flexion, excellent patellar tracking  and good collateral ligament stability at 0, 30, 90 degrees.  Patella  was then freehand cut and 32, 3 peg patella was placed.  Same stability  parameters were maintained.  Patella tracked well with no-touch  technique.  The trial components were removed  using pulsatile  irrigation.  The joint was irrigated.  The two components were then  cemented into position.  Excess cement was removed.  The cement was  allowed to harden.  Tourniquet was released.  The bleeding points were  encountered and controlled with electrocautery.  Incision was then  closed over bolster and a Hemovac drain was placed using interrupted  inverted #1 Vicryl suture, 0 Vicryl suture, 2-0 Vicryl suture, and skin  staples.  The 3-0 nylon was used to close the small incisions used for  the pins for computer assistance.  At this time, the patient's foot was  well perfused.  Bulky dressing and knee immobilizer was placed.  Dr.  Lenny Pastel assistance was required at all times during the case for  opening, for exposure, for retraction, for protection of neurovascular  structures, and for closing.  His assistance was a medical necessity.      Burnard Bunting, M.D.  Electronically Signed      GSD/MEDQ  D:  11/17/2008  T:  11/18/2008  Job:  [id number]

## 2010-08-02 NOTE — Consult Note (Signed)
[name], [name]                   ACCOUNT NO.:  [id number]   MEDICAL RECORD NO.:  [id number]          PATIENT TYPE:  AMB   LOCATION:  SDS                          FACILITY:  [facility]   PHYSICIAN:  Doylene Canning. Ladona Ridgel, MD    DATE OF BIRTH:  [birth year]   DATE OF CONSULTATION:  11/19/2008  DATE OF DISCHARGE:  11/16/2008                                 CONSULTATION   PRIMARY CARE PHYSICIAN:  Fleet Contras, MD   PRIMARY CARDIOLOGIST:  Luis Abed, MD, [facility], whom she saw in 2007.   CHIEF COMPLAINT:  Chest pain.   HISTORY OF PRESENT ILLNESS:  Ms. Franklin is a 55 year old female with no  previous history of coronary artery disease.  She had chest pain in  2007, and a stress test at that time showed no ischemia, no scar, and EF  of 61%.  She was admitted to the hospital for a total knee replacement  on November 16, 2008.  Postoperatively, she had some anemia with a  hemoglobin today of 8.9.  Today, she developed chest pain.  It is  substernal and she describes it as severe.  It is radiating to her left  arm.  She has had previous episodes, but never this bad.  She was  slightly short of breath, but denies nausea, vomiting, or diaphoresis.  She received sublingual nitroglycerin x1 and her symptoms improved.  She  is not sure how bad it was, just calls it bad, but she says the chest  pain is down to a 2/10 right now.  Although she does still complain of  some mild chest pain, she appears to be resting comfortably.  She is not  able to remember when her last episode of chest pain was and if it was  exertional or not.   PAST MEDICAL HISTORY:  1. Status post admissions for chest pain in 2002, and 2007, with      negative nuclear stress test.  2. History of DVT.  3. 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  4. Diabetes.  5. Hypertension.  6. Hyperlipidemia.  7. Morbid obesity.  8. History of asthma.  9. Osteoarthritis and fibromyalgia.  10.History of depression.  11.Gastroesophageal reflux  disease.  12.Bronchitis.  13.Remote history of crack cocaine use.  14.History of TIA.  15.History of a right adrenal mass, approximately 2.0 cm in 2007.  16.History of a thyroid nodule, fine needle aspiration findings      consistent with hyperplastic nodule in 2007.   SOCIAL HISTORY:  She lives in an apartment in [place].  She is  divorced and lives by herself.  She denies any history of significant  tobacco use.  She states that she used to smoke crack cocaine, but went  through a program and has been drug free since 2002.  She denies alcohol  abuse.   FAMILY HISTORY:  Both her parents are in their 64s and neither one has  any history of coronary artery disease.  There are no siblings with  heart disease.   REVIEW OF SYSTEMS:  She has chronic joint pain  in addition to the  arthritis pain.  She has occasional reflux symptoms, but denies melena.  She has had no fevers, chills, or sweats.  She has not had headaches.  The chest pain is described above.  She has chronic dyspnea on exertion  and some chronic orthopnea secondary to body habitus.  Full 14-point  review of systems is otherwise negative.   ALLERGIES:  No known drug allergies.   CURRENT MEDICATIONS:  1. MiraLax daily.  2. Seroquel 50 mg b.i.d. and 300 mg nightly.  3. Lamisil 250 mg daily.  4. Coumadin daily.  5. Ventolin q.6 h.  6. Aspirin 325 mg x1.  7. Lipitor 20 mg daily.  8. Symbicort b.i.d.  9. Soma 350 mg t.i.d.  10.Catapres 0.1 mg b.i.d.  11.Cardizem 180 mg daily.  12.Cymbalta 40 mg b.i.d.  13.Neurontin 300 mg b.i.d.  14.Hydrochlorothiazide 25 mg daily.  15.Sliding scale insulin.  16.Lisinopril 20 mg daily.  17.Claritin 10 mg a day.  18.Glucophage 500 mg b.i.d.  19.Morphine p.r.n.  20.Protonix 40 mg b.i.d.   PHYSICAL EXAMINATION:  VITAL SIGNS:  Temperature 97.7, blood pressure  127/67, heart rate 117, respiratory rate 21, O2 saturation 100% on 2 L.  GENERAL:  She is a well-developed, obese African  American female in no  significant distress.  HEENT:  Normal except her dentition is poor.  NECK:  There is no lymphadenopathy, thyromegaly, bruit, or JVD noted.  CV:  Her heart is regular in rate and rhythm, but fairly rapid with an  S1 and S2 noted and no significant murmur, rub, or gallop is noted.  Distal pulses are intact in all 4 extremities.  LUNGS:  Essentially clear to auscultation bilaterally.  SKIN:  No rashes or lesions are noted and her incision is without  significant drainage.  ABDOMEN:  Soft and nontender and slightly decreased bowel sounds, but  present.  EXTREMITIES:  They are tender to palpation diffusely.  There is no  cyanosis, clubbing, or significant edema noted.  MUSCULOSKELETAL:  There is no joint deformity or effusions.  NEUROLOGIC:  She is alert and oriented.  Cranial nerves II through XII  grossly intact.   Chest x-ray, cardiomegaly and low lung volumes without acute disease.   EKG; sinus tachycardia, rate 115 with lateral T-waves, slightly  different from an EKG dated earlier today.  These are also different  from an EKG dated November 16, 2008.  Inferior Q-waves are not seen on an  EKG dated April 2010.   LABORATORY VALUES:  Hemoglobin 8.9, hematocrit 26.7, WBCs 8.1, platelets  202, INR 1.2.  Sodium 136, potassium 3.2, chloride 100, CO2 28, BUN 9,  creatinine 0.75, glucose 144.  CK-MB 324/4.6 with an index of 1.4,  troponin I 0.02, and BNP less than 30.   IMPRESSION:  Chest pain:  [name] was seen today by Dr. Ladona Ridgel.  She is  a 55 year old female with no previous history of coronary artery  disease.  She has chest pain with an abnormal EKG in the postoperative  setting as well as anemia, status post transfusions and cardiac risk  factors of diabetes, hypertension, hyperlipidemia, and morbid obesity.  Her symptoms and EKG are concerning, but not diagnostic of coronary  ischemia.  We recommend intravenous Lopressor along with nitroglycerin  and  aspirin.  Serial enzymes will be ordered if not ordered already.  If  her cardiac enzymes are elevated, left heart catheterization is  indicated.  In 2008, an echocardiogram showed an EF of 60-70% with  no  wall motion abnormalities.  We will repeat this.  We will not use  heparin with her anemia and history of recent surgery.  Further  evaluation and treatment will depend on the results of the labs and  other testing that are ordered.      Theodore Demark, PA-C      Doylene Canning. Ladona Ridgel, MD  Electronically Signed    RB/MEDQ  D:  11/19/2008  T:  11/20/2008  Job:  [id number]

## 2010-08-02 NOTE — Consult Note (Signed)
[name], [name]                   ACCOUNT NO.:  [id number]   MEDICAL RECORD NO.:  [id number]          PATIENT TYPE:  INP   LOCATION:  2925                         FACILITY:  [facility]   PHYSICIAN:  Beckey Rutter, MD  DATE OF BIRTH:  13-Apr-1955   DATE OF CONSULTATION:  11/18/2008  DATE OF DISCHARGE:                                 CONSULTATION   REQUESTING PHYSICIAN:  Burnard Bunting, MD, of Orthopedics.   PRIMARY CARE PHYSICIAN:  Dr. Breck Coons.   CONSULTING PHYSICIAN:  Beckey Rutter, MD, of [facility]  4.   [facility]OLOGIST[facility]or[facility], the patient requests Dr. Verdis Prime.   REASON FOR CONSULTATION:  Chest pain.   HISTORY OF PRESENT ILLNESS:  [name] is a 55 year old female patient  with history of diabetes and hypertension, as well as prior DVT, on  chronic Coumadin.  She has been followed by Dr. Truett Perna in the past for  this, she was to undergo elective knee replacement surgery on November 17, 2008, and as a precaution because her Coumadin would have to be placed  on hold.  An IVC filter was placed preoperatively in Radiology here at  [facility] on November 16, 2008.  She subsequently underwent a left total knee  replacement on November 17, 2008.  Her preoperative hemoglobin was 12.  Postop day #1, her hemoglobin had decreased to 9.9 and today her  hemoglobin had decreased to 8.9.  Subsequently, Dr. August Saucer has ordered  packed red blood cells x1 to be given today.  In addition, today, the  patient had a new complaint of chest pain, constant in nature, pressure  like over the left anterior chest, level 10/10, radiating to the left  arm, achy in quality.  This was associated with mild shortness of breath  and clinical indicators from staff show tachypnea and tachycardia.  The  patient denies nausea or vomiting with this pain.  Pain began around 9  a.m. and has been persisting until I began to evaluate the patient  around 10:45 in the morning after being called by Dr. August Saucer  for  consultation.  Upon my arrival, the patient apparently had some  hypotensive with systolic blood pressures in the 80s and low 90s and  have received a small fluid challenge and IV fluids now at 75 an hour.  Her pressures now up to 110.  Because of the hypotension, sublingual  nitroglycerin nor IV morphine had been given to help with the chest  pain.  In talking with the patient regarding any chest pain symptoms  prior to admission to the hospital or preoperatively, she did not have  any chest pain, but has had some increasing shortness of breath over the  past, maybe 2 weeks with no particular pattern to this shortness of  breath.  The patient has seen Dr. Breck Coons for this and initially they  felt this was related to her issues of chronic bronchitis and asthma, so  apparently a new inhaler was added.  At this time, the patient cannot  recall the name of the inhaler.  The shortness of breath symptoms  persisted.  They were not a very long duration, again had no pattern and  when questioning the patient does note that these seem to be different  than her usual shortness of breath symptoms that are associated with her  asthma.  Again, after my arrival, I evaluate the patient and she did  appear to be in acute distress and I looked at her EKGs and compared to  an April EKG for which she presented to the ER with chest pain and  cough, she has new Q-waves in leads II, III, and aVF and lots of T-waves  in the lateral leads V4, V5, and V6.  Preoperative EKG shows similar Q-  waves in the inferior leads, although they are not as prominent.  Again,  the April EKG shows a normal EKG with no Q-waves and normal ST in the  lateral leads.  Because of the patient's history of prior DVT despite  the IVC filter, Dr. August Saucer raises the concern of possible PE causing her  chest discomfort.  We are also asked to evaluate for possible ischemic  etiology to this chest pain.   REVIEW OF SYSTEMS:  As per the  history present illness.  CONSTITUTIONAL:  No fevers, chills, or myalgias at home other than the patient's chronic  myalgias related to diagnosis of fibromyalgia.  PSYCH:  No increasing  depression or anxiety symptoms reported by the patient.  No change in  social status or increase of psychosocial stressors reported per the  patient.  EYES, EARS, NOSE, and THROAT:  This category is negative or  noncontributory.  NEURO:  No dizziness.  No unilateral weakness.  No  visual disturbances and no syncope.  No difficulty in using limb or  difficulty with ambulation.  CHEST:  She has had recent bronchitis  associated with nausea, vomiting, and fever and April 2010.  Does not  have a chronic cough, but has had progressive shortness of breath, no  specific pattern.  Cannot relate to whether this is with exertion or  rest.  CARDIAC:  No chest pain reported until today.  No orthopnea.  No  tachy palpitations at home.  ABDOMEN:  No nausea, vomiting, or diarrhea  episode is reported in April 2010 with bronchitis episode.  No  hematemesis.  No hematochezia.  No melena.  MUSCULOSKELETAL:  She has  chronic fibromyalgia pain with reported involvement of the upper  extremities, back, and lower extremities.  Otherwise, review of systems  categories are negative or noncontributory.   SOCIAL HISTORY:  No tobacco and no alcohol.  She lives with her family.   FAMILY HISTORY:  Mother with hypertension.   PAST MEDICAL HISTORY:  1. Hypertension.  2. DVT, on Coumadin preop and has been restarted postop.  This is      followed by Dr. Truett Perna.  3. Fibromyalgia.  4. Osteoarthritis.  5. Anxiety and depression.  6. GERD.  7. Bronchitis and asthma.  8. Diabetes, apparent new diagnosis.   PAST SURGICAL HISTORY:  1. C-section.  2. Total abdominal hysterectomy.  3. Left knee arthroscopy.   ALLERGIES:  NKDA.   CURRENT MEDICATIONS AT HOME:  1. MiraLax daily.  2. Terbinafine 250 mg in the morning.  3.  Diltiazem 180 mg daily.  4. Darvocet as needed.  5. Coumadin 5 mg daily.  6. Seroquel 50 mg b.i.d.  7. Cymbalta 20 mg b.i.d.  8. Loratadine 10 mg daily.  9. Seroquel XR 300 mg at hour sleep.  10.Lisinopril/HCTZ 20/25 daily.  11.Soma 350 mg t.i.d.  12.Gabapentin 300 mg b.i.d.  13.Metformin 500 mg b.i.d.  14.Omeprazole 20 mg b.i.d.  15.Clonidine 0.1 mg b.i.d.  16.Albuterol 0.083% nebulizer every 6 hours.  17.Albuterol inhaler p.r.n.  18.Symbicort 1 puff b.i.d.  19.Oxygen 2 liters at night.  20.Lipitor 20 mg daily.   In the hospital, the majority of the patient's home medications have  been restarted.  In addition, she has also been placed on sliding scale  insulin, p.r.n. IV morphine for pain control, and Pharmacy is dosing her  Coumadin.   PHYSICAL EXAMINATION:  GENERAL:  An anxious female patient in acute  distress upon my arrival at 10:45 in the morning, complaining of left  anterior chest pain radiating to the left arm.  VITAL SIGNS:  Temperature 97.7.  Repeat vitals show a BP of 110/63,  pulse 115, respirations 26, nonlabored.  PSYCH:  The patient is alert and oriented.  Her affect is appropriate to  current situation.  She is anxious because of her chest pain and change  in physical status.  NEURO:  Cranial nerves II-XII are grossly intact.  She is moving  extremities x4 without any focal neurological deficits.  EYES:  Sclerae are noninjected, nonicteric bilaterally.  Conjunctivae  are pale.  EARS, NOSE, and THROAT:  Ears are symmetrical.  No otorrhea.  Nose is  midline.  No rhinorrhea.  Oral mucous membranes are pink and moist.  CHEST:  Bilateral lung sounds posteriorly, have crackles beginning at  the mid fields, faint, increasing in intensity if you get to the bases.  There is no wheezing, although there was a report that the patient  sounded wheezy earlier this morning.  She is on O2 at 2 liters per  minute with 100% saturation.  She is tachypneic, but nonlabored.   CARDIOVASCULAR:  Heart sounds are S1 and S2 without obvious rubs,  murmurs, or gallops.  Her pulse is tachycardiac.  This is confirmed with  the EKG, sinus tachycardia.  I do not appreciate any JVD.  She does have  some nonpitting peripheral edema on the lower extremities, more so on  the left lower extremity postoperatively.  ABDOMEN:  Soft, nontender, and nondistended without hepatosplenomegaly,  masses, or bruits.  Bowel sounds are present in all 4 quadrants.  MUSCULOSKELETAL:  The patient does have a long immobilizing leg brace on  the left lower extremity.  This is clean, dry, and intact without  evidence of bleeding and she does have bilateral point tenderness very  severe in the shoulders, chest, and upper extremities consistent with  her diagnosis of fibromyalgia.   LABORATORY DATA:  Sodium 136, potassium 3.2, CO2 of 28, glucose 144, BUN  9, and creatinine 0.75.  PT 15.4 and INR 1.2.  Hemoglobin today is 8.9,  preoperatively it was 12.  White count 8100, platelets of 202,000.  I  have given verbal orders for cardiac enzymes and BNP and these are still  pending.   DIAGNOSTICS:  Two-view chest x-ray on November 16, 2008, showed no CHF and  cardiomegaly.  Stat portal chest x-ray has also been done today, now  just received that result and that also shows no CHF.  CT angio of the  chest done on July 16, 2008, showed no PE.  Ultrasound and venogram on  November 16, 2008, showed optimal placement of the IVC filter.  EKGs  described with the July 16, 2008, EKG showed sinus rhythm.  No ST-  segment changes.  No  Q-waves.  No T-wave changes and probable LVH.  The  August 30th preop EKG shows sinus rhythm, questionable LVH, nonspecific  T-wave abnormalities in the lateral leads and new Q-waves in leads II,  III, aVL, effort most prominent being in lead III and in November 19, 2008, EKG today at 9 a.m. and repeated at 11 does show ST sinus  tachycardia with increased depth of the Q-waves in  II, III, and aVF and  continued loss of T-waves.  The patient has also undergone a 2-D  echocardiogram in 2008 at that time.  This showed an EF of 60-70%.  No  regional wall motion abnormalities.  This was read by Dr. Amil Amen.   IMPRESSION:  1. Chest pain of uncertain etiology, high index of suspicion ischemic      etiology, rule out pulmonary embolism versus other.  2. Postoperative acute blood loss anemia, exacerbating problem chest      pain.  3. Recent hypotension and tachycardia secondary to volume depletion      and anemia, improving.  4. History of deep vein thrombosis, on Coumadin preop, status post      preoperative inferior vena cava filter with negative CT angio of      the chest on July 16, 2008.  5. Diabetes.  6. Hypertension.  7. Hypokalemia.   PLAN:  1. Transfer the patient to 2900 unit, again rule out acute MI      protocols.  2. Give aspirin now and give a total of sublingual nitroglycerin x3.      If chest pain continues, the patient may need to start on      nitroglycerin drip and give additional p.r.n. morphine to resolve      chest pain.  We will hold off on any anticoagulation night now      unless her enzymes are positive or her chest pain persists given      her recent hypotension and symptomatic acute blood loss anemia.  We      will also may need to discuss this with Dr. August Saucer before giving      anticoagulation.  She is also on her Coumadin without bridging      anticoagulation.  3. We will follow cardiac isoenzymes serially and check a BMP.  If      enzymes are positive or the patient develops new EKG changes such      as ST-segment changes or chest pain persists, we will probably need      to obtain a cardiology consult, the patient requests Dr. Verdis Prime.  4. We will treat the anemia.  I agree with the unit of PRBCs as      already ordered.  We will check an H&H stat after this is infused.      This will be called to the doctor.  The patient  may need additional      packed red blood cells, especially if she is continuing to have      cardiac symptoms.  In addition, we will check CBC in the a.m.  We      will hold maintenance fluid while blood is infusing.  5. Until issues about acute ischemic event have been clarified, we      will make her n.p.o. except for meds in the event she may need to      go to the Cardiac Catheterization Lab in the next 24 hours.      Because  of this, we will replete potassium IV by adding the      potassium to the maintenance fluids and check a BMET in the      morning.  6. Check a baseline hemoglobin A1c since she is a new diabetic.  We      may need to check a fasting lipid panel this admission as well as      if this has not been done by Dr. Breck Coons.  I arrived at the bedside      at 10:45 a.m. and I completed my evaluation and assessment and      dictation at 12:20 p.m.      Allison L. Rennis Harding, N.P.      Beckey Rutter, MD  Electronically Signed    ALE/MEDQ  D:  11/19/2008  T:  11/20/2008  Job:  [id number]

## 2010-08-05 NOTE — Discharge Summary (Signed)
NAMEKEMYRA, AUGUST                   ACCOUNT NO.:  [id number]   MEDICAL RECORD NO.:  [id number]          PATIENT TYPE:  INP   LOCATION:  3733                         FACILITY:  [facility]   PHYSICIAN:  Beatrix Fetters, M.D.     DATE OF BIRTH:  14-Jul-1955   DATE OF ADMISSION:  05/01/2006  DATE OF DISCHARGE:  05/03/2006                               DISCHARGE SUMMARY   PRIMARY CARE PHYSICIAN:  [facility]   DISCHARGE DIAGNOSES:  1. Deep vein thrombosis.  2. Chest pain.  3. Hypertension.  4. Hypokalemia.  5. Hyperlipidemia.  6. Left knee degenerative joint disease.  7. Gastroesophageal reflux disease.  8. Thyroid nodule.  9. Adrenal mass.  10.Cocaine abuse.   DISCHARGE MEDICATIONS:  1. Cardizem as directed by primary care physician.  2. Hydrochlorothiazide as directed.  3. Clonidine as directed by primary care physician.  4. Lipitor as directed by primary care physician.  5. Prozac as directed by primary care physician.  6. Lisinopril as directed.  7. Albuterol as directed.  8. Coumadin 7.5 mg q.day.  9. Lovenox injections 160 mg injection q.day.   DISPOSITION AND FOLLOWUP:  Patient is to return to [facility]  [facility]atory on February 18 at 11 o'clock to have PT and INR checked,  results will be shown to Dr. Margarette Canada at [facility], patient will then  return to Dr. Margarette Canada at [facility] on February 20 at 9 o'clock.   IMAGES PERFORMED:  Patient had a chest CT which was negative for  pulmonary embolus.   CONSULTATIONS:  No consultations were required for this admission.   BRIEF ADMITTING HISTORY AND PHYSICAL:  Ms. Katich is a 55 year old female  who came in complaining of one week of lower extremity pain on the left  side from the knee down.  Patient describes the pain as a tenderness to  palpation about the medial aspect of the left calf.  She had suffered  from a bronchitis-type illness three weeks prior and had been lying in  bed for quite awhile.  One week prior to admission,  she had noted some  mid-sternal chest pain that radiated to the right side, it came on at  rest and resolved on its own.  Patient also noted that taking her  mother's Protonix helped this chest pain.  Patient also complains of  some lower sacral pain.  Patient reports no medication changes, fevers,  chills or nausea or vomiting.  Patient reports no history of DVT and no  family history of blood dyschezias or DVT.   PHYSICAL EXAMINATION:  VITAL SIGNS:  Temperature 97.0, blood pressure  121/84, pulse 75, respirations 24, O2 sat 96% on room air.  GENERAL:  Patient is in no apparent distress.  EYES:  Pupils are equal, round and reactive to light and accommodation.  Extraocular muscles intact.  ENT:  Oropharynx clear.  NECK:  Supple.  RESPIRATORY:  Lungs were clear to auscultation bilaterally.  CARDIOVASCULAR:  Regular rate and rhythm, no murmurs, rubs or gallops.  GI:  Soft, nontender, nondistended, positive bowel sounds.  EXTREMITIES:  No rashes or clubbing.  SKIN:  No erythema.  MSK:  Patient on bilateral lower extremities had decreased range of  motion about the knee joints from zero to 40 degrees and prominent  tibial tubercles and had tenderness to palpation along the medial border  of the left lower extremity.  NEURO:  Cranial nerves II-XII were grossly intact.  PSYCH:  Patient was appropriate.   ADMISSION LABORATORY DATA:  Sodium 137, potassium 3.3, chloride 101,  bicarb 28, BUN 9, creatinine 0.8, glucose 91, hemoglobin 12.3,  hematocrit 36.4, white count 4.3, platelets 250, MCV 87, D-dimer was  1.58, PT was 13.4, INR 1.0, PTT 32, troponin 0.02, CK-MB 1.3, CK 221.   HOSPITAL COURSE:  1. Deep vein thrombosis.  Patient was found to have a DVT in the      posterior tibial vein on the left side confirmed by Doppler.      Patient has risk factors including stasis, inactivity, obesity and      being a female.  Patient had a hypercoagulability panel drawn that      was negative.  CT  angio was negative for pulmonary embolus.  The      patient was started on treatment dose of Lovenox at 1 mg/kg q.12.      Patient was then discharged with Coumadin followup with Lovenox at      160 q.day and Coumadin at 7.5 mg q.day to be followed by      [facility] on February 18.  2. Chest pain.  Differential diagnosis originally included pulmonary      embolus which was ruled out by CT and acute coronary syndrome which      is very unlikely given negative EKGs and cardiac markers that were      negative x3.  Most likely diagnosis was GERD given patient's      history of alleviation with Protonix.  Markers were cycled and were      negative.  Patient was placed on telemetry with no abnormalities.      Patient was started on aspirin once a day and patient was also      discharged with Protonix.  3. Hypertension.  Patient's blood pressures are well controlled at      home with calcium channel blocker, an ACE, hydrochlorothiazide and      clonidine.  During her hospital stay, her ACE was held for possible      dye loads given the need for a CT angiogram and her      hydrochlorothiazide was held for decreased potassium.  Patient's      blood pressure was managed with the calcium channel blocker and was      well controlled during her hospital stay.  Patient was discharged      on all her home blood pressure medications.  4. Hypokalemia.  This was likely secondary to the patient's      hydrochlorothiazide use.  She reports no history of GI distress.      Patient's hydrochlorothiazide was held during her hospital stay and      B-Mets were followed.  Patient's potassium returned to normal prior      to discharge and was sent home on her normal home dose of      hydrochlorothiazide.  5. Hyperlipidemia.  This is managed as an outpatient by [facility].      Patient came in on Lipitor and was discharged on her same dose of      Lipitor. 6. Left knee degenerative joint disease.  This  problem  is also managed      as an outpatient.  The patient did not report increased pain during      her stay.  Patient's pain was controlled with p.r.n. Tylenol.  7. Gastroesophageal reflux disease.  Patient reports that her chest      pain is alleviated by her mother's proton pump inhibitor.  Patient      was given Protonix during her stay and never experienced any chest      pain during her admission.  Patient was discharged with Protonix.  8. Thyroid nodule.  This was biopsied in October of 2007 and was found      to be a hyperplastic nodule and is managed as an outpatient.  9. Adrenal mass.  This was biopsied in January of 2008 was found to be      a benign cortical mass.  10.Cocaine abuse.  The patient completed rehab in 2003 to 2005 and      slipped once in 2007.  The patient was counseled on the importance      of abstinence from cocaine.   DISCHARGE VITAL SIGNS:  Temperature 96.9, blood pressure 114/74, pulse  61, respirations 18, O2 sat 99% on room air.   DISCHARGE LABS:  Sodium 141, potassium 3.7, chloride 105, bicarb 29, BUN  12, creatinine 0.81, glucose 120, white count 3.9, platelets 230,  hemoglobin 11.3, hematocrit 33.2, PT 14.2, INR 1.1.      Beatrix Fetters, M.D.  Electronically Signed     CA/MEDQ  D:  05/03/2006  T:  05/04/2006  Job:  [id number]

## 2010-08-05 NOTE — Assessment & Plan Note (Signed)
[facility]                              CARDIOLOGY OFFICE NOTE   [name], [name]                          MRN:          [id number]  DATE:10/24/2005                            DOB:          April 30, 1955    The patient is referred to me from the emergency room at [facility]  .  She was there on October 21, 2005.  She had chest pain.  All of her  troponins were normal.  She was discharged and asked to be seen for  cardiology followup.  The patient did have a chest CT scan.  There was no  evidence of pulmonary embolus.  However, there was a hypodense area in the  right thyroid and ultrasound has been recommended.   The patient is here today.  She is not having any recurrent chest pain.  She  did have a Cardiolite scan done in 2002 showing a normal ejection fraction.  No obvious ischemia.  She has some shortness of breath from lying down.  Also she has given me today the copy of a x-ray report dated Aug 14, 2005.  It was a pelvic CT scan.  The impression was that the patient had two small  lesions in the left lobe of the liver that were unchanged from the past and  most likely cysts.  There was also a 2 x 2 cm right adrenal mass that may be  an adenoma.  However, it was not completely characteristic and an MRI was  recommended.  I was not involved in ordering any of these studies.   ALLERGIES:  No known drug allergies.   MEDICATIONS:  Clonidine 0.1 mg b.i.d.   OTHER MEDICAL PROBLEMS:  See the complete list below.   SOCIAL HISTORY:  The patient has lived in various places and says that she  now lives here in [place].  She does not smoke.   FAMILY HISTORY:  There is no strong family history of coronary disease.   REVIEW OF SYSTEMS:  Today she mentions some rare shortness of breath.  Otherwise her review of systems is negative.  The patient does have a  problem with constipation.  The patient has some arthritis and anxiety and   depression.   PHYSICAL EXAMINATION:  GENERAL:  The patient is oriented to person, time and  place.  Affect is normal.  VITAL SIGNS:  Blood pressure today, however, is 170/100 with a pulse of 60.  LUNGS:  Clear.  Respiratory effort is not labored.  HEENT:  No xanthelasma.  She has normal extraocular motion.  NECK:  There are no carotid bruits.  There is no jugular venous distension.  CARDIAC:  S1 and S2.  There are no clicks or significant murmurs.  ABDOMEN:  Soft.  There is no masses or bruits.  There is no significant  peripheral edema at this time.   LABORATORY DATA:  At [facility], hemoglobin was normal.  Renal function was normal.  Chest CT scan revealed no evidence of pulmonary  embolus.  There is mention of a  nonspecific 2 cm right adrenal nodule.  Also there is a right thyroid hypodensity and ultrasound is warranted.  There is a thyroid-type hypodensity.  There is comment that ultrasound is  warranted of the 2 cm nonspecific right adrenal nodule.   PROBLEMS:  1.  History of some anxiety and depression.  2.  Gastroesophageal reflux disease.  3.  Question of a transient ischemic attack in the past.  4.  Some palpitations.  5.  History of hypertension.  6.  She is newly being placed on clonidine.  Blood pressure needs to be      followed with potential adjustment of her medications.  7.  History of ejection fraction at 59% in July 2002 with no ischemia at      that time.  8.  Recent emergency room visit with chest discomfort.  There was no      definite pulmonary embolus.  There was no evidence of myocardial      infarction.  9.  Chest discomfort.  I will consider further tests later but I am not      convinced that there is significant cardiac component.  10. Area of hypodensity in her thyroid mentioned on the chest CT scan.  11. Mention of outside studies and an adrenal nodule.  This was also      mentioned on the chest CT scan.  Recommendation from the  outside was an      MR and from out radiology was an ultrasound.   We will obtain an ultrasound and I will see her back in followup and try to  provide the continuity as this patient does not have a primary physician at  this time.  When these issues are resolved, I will decide about further  cardiac work-up and then be assured that information is sent to a primary  physician.                               Luis Abed, MD, [facility]    JDK/MedQ  DD:  10/24/2005  DT:  10/24/2005  Job #:  [id number]

## 2010-08-05 NOTE — H&P (Signed)
[name], [name]                   ACCOUNT NO.:  [id number]   MEDICAL RECORD NO.:  [id number]          PATIENT TYPE:  INP   LOCATION:  1823                         FACILITY:  [facility]   PHYSICIAN:  Elliot Cousin, M.D.    DATE OF BIRTH:  08-26-1955   DATE OF ADMISSION:  01/02/2006  DATE OF DISCHARGE:                                HISTORY & PHYSICAL   PRIMARY CARE PHYSICIAN:  The patient is unassigned.   PRIMARY CARDIOLOGIST:  Dr. Willa Rough.   CHIEF COMPLAINT:  Chest pain.   HISTORY OF PRESENT ILLNESS:  The patient is a 55 year old lady with a past  medical history significant for chest pain, thyroid nodule, right adrenal  nodule, hypertension and hyperlipidemia, who presents to the emergency  department with a chief complaint of chest pain.  The patient says that the  chest pain woke her up out of her sleep at approximately 6:30 a.m. yesterday  morning.  It was located in the substernal area.  She describes the pain as  a nagging pressure.  It is sometimes a sharp pain that radiates to her lower  back.  At its worst, it is associated with shortness of breath and radiation  to the left arm.  The patient believes that she may have become a little  diaphoretic; however, she is not sure.  She did have associated nausea and  vomiting with the chest pain; however, she had several more episodes of  nausea and vomiting without the chest pain later in the day.  The pain is  worse when she lies flat.  She did take some ibuprofen, which helped a  little.  The pain has been intermittent, on and off, for approximately 18-24  hours.  The pain deviates from a 10/10 in intensity to a 6/10 in intensity.  She has also had some mild swelling in both legs, but no unilateral leg  swelling.  She has had loose stools, numbering 6 yesterday.  She denies  bright red blood per rectum and black tarry stools.  She has had some  intermittent burning with urination.  She has not been exposed to any sick  contacts.  She has not been treated with antibiotics recently.  She denies  indigestion and reflux symptoms.   During the evaluation in the emergency department, the patient was found to  be afebrile and hypertensive with a blood pressure of 206/126.  She was  intermittently bradycardic with the heart rate ranging from 55-60.  She was  given 4 baby aspirin by the EMT en route to the hospital, which she says  eased her pain a little to a 5-6/10 in intensity.  Her EKG reveals sinus  bradycardia with a heart rate of 56 beats per minute, but no acute ST or T  wave abnormalities.  Her initial cardiac markers are negative.  Her D-dimer  is negative.  She will be admitted for further evaluation and management.   PAST MEDICAL HISTORY:  1. History of chest pain in 2002 with a negative Cardiolite study.  2. Emergency room visit for chest pain  in August 2007 with negative      cardiac markers.  A CT scan of the chest was performed and was negative      for PE.  The patient was seen and evaluated in the office by      cardiologist, Dr. Myrtis Ser, on October 24, 2005 and December 27, 2005.  3. Solid 12-mm right thyroid nodule per ultrasound of the neck in      September of 2007, biopsy recommended by the radiologist.  4. Stable 1.9-cm right adrenal nodule per CT scan of the abdomen and      pelvis, December 28, 2005.  The patient has a history of a biopsy in      January 2007 which was negative for atypia and malignant cells.  5. Hypertension.  6. Hyperlipidemia.  7. History of hypokalemia.  8. Status post hysterectomy secondary to fibroids.  9. Status post C-section.  10.? History of gastroesophageal reflux disease.  11.History of cocaine abuse, abstinent since 2002 following      rehabilitation.  12.Status post cervical disk surgery in 2003 (the patient says that she      has a metal plate in her neck).   MEDICATIONS:  The patient says that the only medication she can afford is  clonidine 0.1 mg  b.i.d.   ALLERGIES:  No known drug allergies.   SOCIAL HISTORY:  The patient is divorced.  She lives in [place] with her  son.  She has 3 children in all.  She is unemployed.  She denies tobacco,  alcohol and illicit drug use.  She is a recovering cocaine addict.   FAMILY HISTORY:  Her father is 98 years of age and has hypertension.  Her  mother is 65 years of age and has degenerative joint disease, history of  hypokalemia, and history of stroke.   REVIEW OF SYSTEMS:  The patient's review of systems is positive for chronic  intermittent right lower back pain and flank pain with some radiation of the  pain to her right leg.  Otherwise, review of systems is negative.   EXAM:  VITAL SIGNS:  Temperature 97.0, blood pressure 191/107, repeated at  206/126, pulse 56, respiratory rate 20, oxygen saturation 100% on room air.  GENERAL:  The patient is a pleasant 55 year old African American woman who  is currently lying in bed in no acute distress.  HEENT:  Head is normocephalic and nontraumatic.  Pupils are equal, round and  reactive to light.  Extraocular movements are intact.  Conjunctivae are  clear.  Sclerae are white.  Tympanic membranes are clear bilaterally.  Nasal  mucosa is dry.  No sinus tenderness.  Oropharynx reveals mildly dry mucous  membranes.  Dentition is fair to poor.  She does have several broken-off  teeth.  No posterior exudates or erythema.  NECK:  There is a well-healed horizontal scar.  Neck is supple with no  adenopathy, no thyromegaly, no bruit, no JVD.  LUNGS:  Clear to auscultation bilaterally.  HEART:  S1 and S2 with a 2/6 systolic murmur.  ABDOMEN:  Mildly obese.  Positive bowel sounds.  Soft.  Mild right  flank/right low back tenderness.  No frontal abdominal tenderness.  No  masses palpated.  No hepatosplenomegaly.  No distention and no rebound.  GU/RECTAL:  Deferred. EXTREMITIES:  Pedal pulses are 2+ bilaterally.  Trace of pedal edema  bilaterally.   MUSCULOSKELETAL:  Mild right lumbosacral tenderness without any edema,  erythema or spasm.  NEUROLOGIC:  The patient  is alert and oriented x3.  Cranial nerves II-XII  are intact.  Strength is 5/5 throughout.  Sensation is intact.   ADMISSION LABORATORY DATA:  EKG reveals normal sinus rhythm with a heart  rate of 56 beats per minute and no acute ST or T wave abnormalities.   WBC 4.2, hemoglobin 12.5, platelets 206,000.  Myoglobin 44.5, CK-MB 1.6,  troponin I less than 0.05, D-dimer 0.40.  Potassium 3.5, chloride 107,  sodium 137, CO2 25, glucose 103, BUN 11, creatinine 0.7, calcium 9.2, total  protein 6.8, albumin 3.6, AST 16, ALT 15, alkaline phosphatase 62, total  bilirubin 0.4.  BNP less than 30.   RADIOLOGIC FINDINGS:  Chest x-ray reveals cardiomegaly without acute  disease.   ASSESSMENT:  1. Chest pain.  The patient's chest pain is located substernally; however,      it appears to be atypical in origin.  She does have cardiac risk      factors including untreated hypertension and hyperlipidemia.  She has a      history of a negative Cardiolite stress test in 2002, five years ago.      Given the patient's risk factors and recurrent chest pain, she deserves      further evaluation via another Cardiolite stress test or a cardiac      catheterization.  2. Hypertension.  The patient's blood pressure is quite elevated; this may      be a consequence of the rebound effects of clonidine, as the patient      says that she was unable to keep medications and solid foods down.  The      patient has been prescribed multiple antihypertensive medications in      the past; however, because of cost, she has been unable to afford any      medication other than clonidine.  3. Bradycardia.  The patient's heart rate is 56 beats per minute.  The      bradycardia may be secondary to both the clonidine and the malignant      hypertension.  4. Nausea, vomiting and diarrhea.  The patient may have a  viral      gastroenteritis.  Of note, she is afebrile and her white blood cell      count is within normal limits.  5. Solid 12-mm right thyroid nodule per ultrasound of the neck in      September 2007.  Per the assessment by the radiologist, a biopsy is      recommended.  6. Chronic intermittent right flank/low back pain.  The patient's      symptomatology may be secondary to degenerative joint disease rather      than a parenchymal abnormality of her right kidney (see the results of      the CT scan of the abdomen and pelvis, December 28, 2005).  7. Uninsured/unemployed.  Per Dr. Henrietta Hoover office notes, the patient was      referred to the [facility].  She has yet to apply.  She says      that she was planning on going to the [facility] on January 03, 2006.   PLAN:  1. The patient will be admitted for further evaluation and management. 2. We will start a nitroglycerin drip, intravenous heparin, aspirin,      Lopressor, morphine, and intravenous Protonix.  3. We will hold the IV heparin until the patient's blood pressure is a bit      more  reasonable.  4. We will consult [facility] for further diagnostic evaluation.      We will also order a 2-D echocardiogram.  5. We will assess the patient's stools for infection.  We will also assess      for an urinary tract infection; a urinalysis and culture will be      ordered.  6. We will check a TSH and fasting lipid panel.  7. We will check an x-ray of the lumbar spine.  8. Consider referral to the interventional radiologist for a thyroid      biopsy after the cardiac workup.  9. We will treat the patient's hypertension with Lopressor, clonidine and      hydrochlorothiazide, as well as the nitroglycerin drip.  10.We will order cardiac enzymes every 8 hours x3 for assessment of an      acute myocardial infarction and/or ischemia.  Repeat the EKG in the      morning.      Elliot Cousin, M.D.  Electronically  Signed     DF/MEDQ  D:  01/02/2006  T:  01/02/2006  Job:  [id number]   cc:   Luis Abed, MD, [facility]

## 2010-08-05 NOTE — Consult Note (Signed)
NAMEGRACIN, SOOHOO NO.:  [id number]   MEDICAL RECORD NO.:  [id number]          PATIENT TYPE:  INP   LOCATION:  3731                         FACILITY:  [facility]   PHYSICIAN:  Luis Abed, MD, FACCDATE OF BIRTH:  [birth year]   DATE OF CONSULTATION:  DATE OF DISCHARGE:                                   CONSULTATION   Amil Mowrey is currently an inpatient at [facility].  She has had some chest  pain and we are asked to see her for further evaluation.  I saw Ms. Plumb in  the office on October 24, 2005.  I at that time reviewed her status  completely.  She had a history in the past of a normal ejection fraction and  a Cardiolite with no ischemia in 2002.  I had considered following along  with another Myoview scan but I first was trying to control her blood  pressure and arrange for other doctors to help take care of her.  She had  other significant problems including a thyroid abnormality and an adrenal  abnormality.   I saw her last in the office on December 27, 2005 and she was stable.  Since  that time, she has had difficulty getting her medicines.  She has had some  swelling in her ankles.  She presented, however, with chest discomfort but  also she had loose stools and on the day of admission had six loose stools.  Her chest pain was in her chest and into her left arm and was sharp and  intermittent.  She was also unable to keep her food down.  The patient had  no syncope or presyncope.  Her admission blood pressure was 206/126.  Her  troponins since being here in the hospital have been normal.  Her EKG has  not shown any diagnostic changes.   ALLERGIES:  No known drug allergies.   MEDICATIONS:  As in outpatient.  She was on Clonidine 0.1 b.i.d.  Accupril  but she did not have any recently.  Potassium which she had none recently.  Either Lipitor or Zocor.  She did not have the medicine at home.  She also  had been taking hydrochlorothiazide.  She did not have  this either.  She did  not have this at home.   OTHER MEDICAL PROBLEMS:  See the extensive list below.   REVIEW OF SYSTEMS:  There is question of fevers, chills and sweats.  She has  had headaches.  She does have some tinnitus also.  She was admitted with  some chest pain and shortness of breath.  She mentions that she has had some  edema.  She has had some urinary and frequency.  She also has had some  weakness and numbness.  She presented with nausea, vomiting and diaphoresis  and she had some symptoms of GERD.  Otherwise, her review of systems is  negative.   PHYSICAL EXAMINATION:  VITAL SIGNS:  Temperature is 98.2 with a pulse of 58,  respirations are 24.  Current blood pressure is 158/91.  The patient  is well-  developed, well-nourished.  She is oriented x3.  Her affect is normal.  HEENT:  Reveals no xanthelasma.  She has normal extraocular motions.  There  are no carotid bruits.  There is no jugular venous distention.  LUNGS:  Reveal a few basilar rales.  There is no respiratory distress.  CARDIAC:  Reveals a S1 with a S2.  There are clicks or significant murmurs.  ABDOMEN:  Her abdomen is soft.  There are no masses or bruits.  At this  time, there is no significant peripheral edema.  There are no major  musculoskeletal deformities.   Chest x-ray revealed cardiomegaly but no edema.  EKG revealed LVH.  BUN was  11 with a creatinine of 0.7, hemoglobin is 11.5.  Troponins are normal x3.  D-dimer was negative.  TSH was also in the normal range.   The patient also had a 2-D echocardiogram while here in the hospital.  Her  ejection fraction is 60%.   PROBLEM LIST:  1. History of hypertension:  The patient was admitted hypertensive.  The      key to stabilizing this patient long-term will be to find her a primary      care physician and find her a way to access her medications.  2. Chest discomfort:  At this point, there is no proof of ischemia.  I      feel we should proceed with  an Adenosine-Myoview scan.  If this is      normal, I would not pursue her chest pain any further from the cardiac      view point.  3. Solid 12 mm right thyroid nodule:  In my office note, I made it clear      that I was trying to find a way to have this biopsy.  I think it is      very important for her to have it biopsied while she is here.  4. History of right adrenal nodule:  It is my understanding that the CT      report has felt that this was not a significant abnormality.  There is      question that it may have been biopsied but I do not have this data.      My understanding is that this is not a significant problem at this      time.  5. Hyperlipidemia.  6. Question of borderline diabetes.  7. Hypokalemia, treated.  8. Gastroesophageal reflux disease.  9. History of cocaine abuse that she stopped with treatment and she has      been quite successful in remaining off her cocaine since 2002.  10.Medical noncompliance:  I believe some of this has to do with her      finances and some of it may have to do with an understanding of her      medical care.  11.Status post hysterectomy.  12.A fusion in her neck due to cervical disc disease.  13.History of anxiety and depression.  14.History of a transient ischemic attack in the past.  15.History of some palpitations.  16.History of good left ventricular function in the past and currently.   We do need to proceed with a Myoview scan to be sure that there is no  significant ischemia.  Also, I believe she may be slightly volume overloaded  and we will give her one dose of Lasix 40 mg IV.  If her Myoview is  negative, I would not pursue any  further cardiac workup.  The patient needs  a primary care physician and a way to access medications.           ______________________________  Luis Abed, MD, [facility]     JDK/MEDQ  D:  01/03/2006  T:  01/03/2006  Job:  [id number]

## 2010-08-05 NOTE — H&P (Signed)
NAMEBRITANIE, HARSHMAN                   ACCOUNT NO.:  [id number]   MEDICAL RECORD NO.:  [id number]          PATIENT TYPE:  INP   LOCATION:  5531                         FACILITY:  [facility]   PHYSICIAN:  Lonia Blood, M.D.      DATE OF BIRTH:  Mar 24, 1955   DATE OF ADMISSION:  06/12/2006  DATE OF DISCHARGE:  06/15/2006                              HISTORY & PHYSICAL   </PRIMARY CARE PHYSICIAN/ Unassigned   DISCHARGE DIAGNOSES:  1. Right upper and middle lobe pneumonia.  2. Intermittent nausea and vomiting, most likely related to esophageal      reflux.  3. Gastroesophageal reflux disease.  4. Morbid obesity.  5. Transient hypokalemia.  6. Mild leukopenia.  7. Anticoagulation secondary to history of deep venous thrombosis.  8. Transient hyperglycemia.  9. Renal insufficiency.  10.Hypertension.  11.The patient also has depression.  12.Bronchial asthma.  13.Dyslipidemia.   DISCHARGE MEDICATIONS:  1. Coumadin 10 mg daily to be adjusted.  2. Clonidine 0.1 mg b.i.d.  3. Guaifenesin 10 mL q.4h. p.r.n.  4. Lipitor 20 mg daily.  5. Lisinopril/hydrochlorothiazide 20/25 one tablet daily.  6. Prozac 20 mg daily.  7. Seroquel 50 mg q.h.s.  8. Ultracet 50 mg 2 tablets three times weekly.  9. Albuterol MDI as needed.  10.Avelox 400 mg daily for five days.   DISPOSITION:  The patient is being discharged home.  She is back to her  baseline.  She will complete five more days of antibiotics for her  pneumonia.   PROCEDURES PERFORMED:  Chest x-ray on March 23 showed cardiac  enlargement without failure.  A repeat chest x-ray on March 25 showed  possibly early airspace disease and right upper lobe.  Followup imaging  recommended to insure resolution.   BRIEF HISTORY AND PHYSICAL:  Please refer to dictated history and  physical by Dr. Elliot Cousin.  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  She has been on  Coumadin and presented with  intractable nausea, vomiting, and also some  cough.  The patient also reported coughing of some blood during this  episode.  INR was 2.2 on presentation.  She also had a chest x-ray above  that shows possible pneumonia.  She was subsequently admitted for  further management.   HOSPITAL COURSE:  1. Intractable nausea and vomiting.  The patient's nausea and vomiting      was controlled with a combination of Zofran and Phenergan.  No      evidence of hematemesis was seen here.  It shows guaiac also      negative.  Her hemoglobin remains stable.  With this in mind, the      patient was maintained on PCI and her diet gradually advanced.  2. Pneumonia.  The patient's hemoptysis is likely secondary to her      pneumonia.  This in the setting of Coumadin.  She has been on      Rocephin, Zithromax, and she remained afebrile.  White count also      remained within  normal range after initial leukopenia.  At this      point, she is stable for discharge.  History of DVT.  The patient's      Coumadin was initially held secondary to reported hemoptysis.      Coumadin has been resumed without any further episodes.  At this      point, her INR is 2.4.  3. GERD.  The patient has been initiated on Protonix at this point.      Severe reflux may have contributed to her nausea, vomiting, and      possibly even the pneumonia which could be resultant from some      occult aspiration.  4. Morbid obesity.  Again, she was counseled extensively.   DISCHARGE LABORATORY DATA:  PT/INR 27 and 2.4.  Hypokalemia.  Potassium  was also repleted.  Her vomiting must have contributed to her  hypokalemia.      Lonia Blood, M.D.  Electronically Signed     LG/MEDQ  D:  06/15/2006  T:  06/15/2006  Job:  [id number]

## 2010-08-05 NOTE — Assessment & Plan Note (Signed)
[facility]                              CARDIOLOGY OFFICE NOTE   [name], [name]                          MRN:          [id number]  DATE:12/27/2005                            DOB:          01/01/1956    Ms. Figge had had chest pain in August of 2007 and I saw her in the office  last on October 24, 2005.  She had no pulmonary embolus.  There was an  abnormality in her thyroid and ultrasound was recommended.  The patient had  an ultrasound dated November 27, 2005 at [facility]  There is a  solid 12 mm right thyroid nodule with increased blood flow.  The report  recommends final aspiration biopsy.  I suspect this may mean fine needle  aspiration biopsy.  I have brought the patient back in to try to help  provide continuity.  This is out of the area of cardiology.  This patient  needs a primary care physician.  We will try very hard to see if we can help  arrange care for her through either the teaching service or [facility].   The patient returns today mentioning that she has discomfort in her right  flank area.  She also tells me that she was at [facility]  [place]  It would appear that the records from her  show that she had some type of study with the question of a right adrenal  mass.  We know that she has had this question before.   PAST MEDICAL HISTORY:   ALLERGIES:  No known drug allergies.   MEDICATIONS:  1. Clonidine 0.1 mg twice daily.  This is the only medicine that she can      afford.  Other medicines listed include  2. Accupril.  3. Potassium.  4. Lipitor.  5. Hydrochlorothiazide.  6. Colace.  7. Aspirin.  8. Ultram.   OTHER MEDICAL PROBLEMS:  See the list below.   REVIEW OF SYSTEMS:  The patient's main problem is some discomfort in her  right flank.   Otherwise review of systems is negative.   PHYSICAL EXAMINATION:  VITAL SIGNS:  Blood pressure today is 198/118, pulse  is 60, her weight is 225 pounds.  GENERAL:  The patient is oriented to person, time and place and her affect  is normal.  HEENT:  Reveals no xanthelasmas.  She has normal extraocular motion.  There  are no carotid bruits.  There is no jugular venous distention.  LUNGS:  Clear.  Respiratory effort is not labored.  CARDIAC:  S1 and S2.  There are no clicks or significant murmurs.  ABDOMEN:  Soft.  There is mild tenderness to palpation over the right  posterior flank.  Patient has no significant peripheral edema.  No tests  were done today.   PROBLEM LIST:  1. History of some anxiety and depression.  2. Gastroesophageal reflux disease.  3. Question of a transient ischemic attack in the past.  4. Limited palpitations.  5. Hypertension.  The patient is clearly  hypertensive at this time.  We      need to increase her clonidine dose.  6. History of ejection fraction 59% and no ischemia in July of 2002.  7. Chest pain in August of 2007 with no pulmonary embolus proven and no      evidence of myocardial infarction.  8. Area of hypodensity in her thyroid by chest CT.  Follow-up ultrasound      at [facility] suggested a 12 mm solid right thyroid nodule with increased      blood flow and recommended an aspiration biopsy.  We need to look into      whether this can be done.  The patient says she has no finances for      further testing.  I will not oversee the evaluation of her thyroid.  9. An abnormal adrenal gland.  CT of the chest at [facility] mentioned      nonspecific 2 cm right adrenal nodule.   Patient needs a general doctor.  We will do everything we can to arrange  this.  We will increase her clonidine.  If she is willing, we will try to  help arrange the biopsy of her thyroid.  It is crucial, however, that she  have a primary care doctor.  Her cardiac status is stable.            ______________________________  Luis Abed, MD, [facility]     JDK/MedQ  DD:  12/27/2005  DT:  12/29/2005   Job #:  [id number]

## 2010-08-05 NOTE — Discharge Summary (Signed)
[facility]  Patient:    MACHELE, DEIHL Visit Number: [id number] MRN: [id number]          Service Type: MED Location: 3W [id number] Attending Physician:  Benny Lennert Admit Date:  08/15/2000 Disc. Date: 08/16/00   CC:         Sonda Primes, M.D. [facility]   Discharge Summary  ADMITTING DIAGNOSIS:  Chest pain, rule out myocardial infarction.  DISCHARGE DIAGNOSES: 1. Hypertension. 2. Atypical chest pain.  PROCEDURES:  Adenosine Cardiolite.  HISTORY OF PRESENT ILLNESS:  Patient is a 55 year old black female patient of Dr. Sonda Primes, who presented to the emergency room with a chief complaint of substernal chest pain with nausea, radiating to the right arm, resolved with rest.  This occurred while at work, did not reoccur in the emergency room, she was pain-free, her EKG was normal, CK and troponin were initially normal.  She was admitted under rule-out protocol because of her risk factors; she is estrogen deficient, has hypertension, has a history of hyperlipidemia and smoking, which she ceased one year ago.  HOSPITAL COURSE:  She was admitted to the general medicine service of Dr. Stacie Glaze and evaluated with serial enzymes and EKG, which were negative.  An adenosine Cardiolite was ordered and performed with the two-day protocol rest stress and was interpreted by radiology as being normal and was interpreted as being electrically normal.  She was pain-free during her admission and her blood pressure was well-controlled.  PHYSICAL EXAMINATION:  On physical examination at the time of her discharge, she is a pleasant, moderately obese black female in no apparent distress. HEENT revealed pupils that were equal, round and reactive to light and accommodation.  Her neck was supple.  Her lung fields were clear.  Heart examination revealed a regular rate and rhythm.  Her abdomen was soft.  Her extremity examination revealed no cyanosis, clubbing or  edema.  Her blood pressure was 118/68 on Toprol-XL 50 mg and Altace.  IMPRESSION: 1. Atypical chest pain with negative Cardiolite, doubt cardiac etiology. 2. Hypertension.  The patient admits to being noncompliant with her medication    prior to admission; she ran out of her Tenormin and possibly had rebound    hypertension with it.  Her blood pressure is in excellent control since she    has been restarted on Toprol and given a dose of Altace.  I would recommend    that she continue on Toprol-XL 50 mg one a day and a baby aspirin one a day    and she will be given a prescription for these medications.  She should    follow up with Dr. Trinna Post Plotnikov in two weeks. 3. Also during her admission, a fasting lipid profile was obtained which    revealed a total cholesterol of 197, an HDL cholesterol of 66 and LDL    cholesterol of 108, giving her no increased risk factors from her current    cholesterol control.  We recommend a low-fat diet and continue with    exercise and weight loss protocol. Attending Physician:  Benny Lennert DD:  08/16/00 TD:  08/16/00 Job: [id number]

## 2010-08-05 NOTE — Discharge Summary (Signed)
NAMESHENEQUA, HOWSE                   ACCOUNT NO.:  [id number]   MEDICAL RECORD NO.:  [id number]          PATIENT TYPE:  INP   LOCATION:  3731                         FACILITY:  [facility]   PHYSICIAN:  Mobolaji B. Bakare, M.D.DATE OF BIRTH:  [birth year]   DATE OF ADMISSION:  01/01/2006  DATE OF DISCHARGE:  01/05/2006                                 DISCHARGE SUMMARY   PRIMARY CARE PHYSICIAN:  Unassigned.  The patient goes to [employer].   FINAL DIAGNOSES:  1. Atypical chest pain.  Negative Cardiolite stress test.  2. Thyroid nodule.  Biopsy scheduled at interventional radiology on      January 09, 2006 at 10:00 a.m.  3. Chronic back pain secondary to degenerative disk disease.  4. Hypertension.  5. Medical noncompliance secondary to financial issues.   CONSULTATIONS:  Cardiology consult with Dr. Myrtis Ser.   PROCEDURES:  1. Chest x-ray showed cardiomegaly without any acute cardiopulmonary      disease.  2. Lumbar spine x-ray showed no acute findings with degenerative disk      disease, most notable at L3-4 with facet degenerative changes in the      lower lumbar spine noted.  3. MRI of the lumbar spine showed multilevel facet disease.  Please see      MRI report for details.  4. X-ray of the right wrist revealed no acute abnormality.  5. Cardiolite stress test done on October 18th showed no fixed or      reversible defects to suggest ischemia, normal left ventricular wall      motion.  Ejection fraction was estimated to be 61%.  6. A 2D echocardiogram done on the 16th of October 2007 showed ejection      fraction of 60%.  Normal left ventricular systolic function.  No      regional wall motion abnormality.   BRIEF HISTORY:  In brief, Ms. Feeny is a 55 year old African-American female  who has medical issues notable for recurrent chest pain.  She has had  evaluation with a Cardiolite stress test in 2002 which was negative, thyroid  nodule yet to be biopsied, hypertension, and  hyperlipidemia.  She has been  noncompliant with followup and medications because of financial issues.  She  cannot afford expensive medications.  The patient has now got an appointment  to follow up at [facility] to continue her medical followup.  She  presented to the emergency room on January 01, 2006 with a chief complaint  of chest pain.  It was substernal in location and pressure like.  No  associated nausea, vomiting or diaphoresis.  She stated that it radiated to  her back and was associated with some shortness of breath and radiation to  the left arm.  The patient was started on nitroglycerin infusion and heparin  in the emergency room.  Her EKG was unremarkable.  At the time of admission,  the patient had a blood pressure of 206/126.  This necessitated  nitroglycerin infusion as well.   HOSPITAL COURSE:  1. Atypical chest pain.  The patient ruled out for myocardial  infarction      with 3 sets of negative cardiac enzymes.  She had a negative D-dimer.      Given the patient's history of hypertension with hyperlipidemia and      moderate obesity, it was felt pertinent to pursue cardiac workup with a      stress test.  Cardiology was consulted.  The patient had a Cardiolite      stress test which showed no evidence of ischemia.  She was chest pain      free within 24 hours of admission.  On further questioning, the patient      denies reflux symptoms.  No heartburn.  However, given the history that      her pain gets worse when she lies down flat, she was empirically placed      on PPI with Protonix.  The patient will try Prilosec OTC on discharge      for 2 weeks.  There is no chest wall tenderness to suggest      musculoskeletal problem.  It is to be noted that the patient had some      nausea, vomiting and diarrhea prior to hospitalization, and this      resolved actually before hospitalization.  2. Chronic back pain.  She had complained of back pain, which has been       ongoing, and it became more exacerbated in the last couple of days.      There was no particular trauma relating to the exacerbation.  She      described a shooting pain from her back to the anterior thigh.      Lumbosacral x-ray showed degenerative disk disease.  This was followed      by an MRI to rule out spinal stenosis or nerve root compression.  MRI      of the lumbar spine showed multilevel facet degenerative disk disease.      There was a suspicion for shallow right foraminal disk protrusion at L4-      5 without definite direct neural compression.  It was felt this could      possibly irritate the right L4 nerve root.  However, the patient's pain      radiation is to the L2 distribution.  She was treated with analgesia,      and she improved.  The patient stated that she has Percocet p.r.n. at      home, which she was encouraged to continue to use.  3. Solid right thyroid nodule.  Biopsy was recommended based on the      ultrasound finding of a solid right thyroid nodule 12 mm in size.  This      ultrasound was done in September 2007.  Unfortunately, the patient      could not have this done.  She was on aspirin, and she has to hold      aspirin for 4 days prior to the procedure.  This has been rescheduled      for the 23rd of October 2007 at 10:00 a.m.  The patient has been      admonished to follow up with this procedure as recommended.  4. Hypertension.  The patient obviously had uncontrolled blood pressure      when she came into the hospital.  This may be related to her multiple      pain issues.  She was started on lisinopril and hydrochlorothiazide in      addition to clonidine.  Of note, the patient was on 0.1 mg of clonidine      prior to admission, and this is a low dose of this medication.  We      needed to up titrate to keep her blood pressure controlled.  She has      not been able to afford expensive medications, and that is why     clonidine was used on an  outpatient basis.  However, the patient now      has an appointment to follow up with [facility] where, I believe, she      will pay a nominal fee for getting her medications.  She was discharged      home on 3 antihypertensives.   DISCHARGE MEDICATIONS:  1. Clonidine 0.2 mg p.o. b.i.d.  2. Hydrochlorothiazide 12.5 mg daily.  3. Lisinopril 20 mg daily.  4. Prilosec OTC, 1 p.o. daily for 2 weeks.  5. Zocor 40 mg daily.   DISCHARGE INSTRUCTIONS:  1. The patient should have B-Met done in 2 weeks (to titrate the      lisinopril).  2. Follow up with [facility] on January 08, 2006 in the a.m.  3. Biopsy of right thyroid nodule to be done at interventional radiology      department at [facility] on January 09, 2006 at 10:00 a.m.      Mobolaji B. Corky Downs, M.D.  Electronically Signed     MBB/MEDQ  D:  01/05/2006  T:  01/05/2006  Job:  [id number]   cc:   Luis Abed, MD, [facility]

## 2010-08-05 NOTE — Discharge Summary (Signed)
[facility]  Patient:    STARNISHA, BATREZ                          MRN: [id number] Adm. Date:  [id number] Disc. Date: 10/03/00 Attending:  Justine Null CC:         Sonda Primes, M.D. [facility]   Discharge Summary  ADMISSION DIAGNOSES: 1. Atypical chest pain. 2. Facial paresthesia.  DISCHARGE DIAGNOSES: 1. Atypical chest pain. 2. Facial paresthesia.  OTHER DIAGNOSES: 1. Bradycardia. 2. Cardiac enlargement. 3. Chest wall pain.  BRIEF HISTORY:  Ms. Hemmelgarn is a 55 year old, African-American female, who has had chest pressure intermittently lasting up to five minutes.  There was no definite trigger or predisposition.  A stress Cardiolite has been scheduled for October 11, 2000.  This was associated with right facial numbness, diaphoresis, palpitations, and shortness of breath.  She has a past medical history of hypertension.  Recently she has been placed on Vioxx for the chest pain by Dr. Adriana Simas, the emergency room doctor.  She does work in an Therapist, music.  At the time of admission, the blood pressure was 134/91, the heart rate was 57, and the respiratory rate was 16 with no increased work of breathing. There were no significant cardiopulmonary symptoms.  The EKG revealed sinus bradycardia.  CT of the brain was negative.  Her serial CPKs ranged from a high of 498 to 375 on October 02, 2000.  The MB component and relative indices were normal.  Her troponin was also normal x 2.  The CBC and differential were normal, except for minimally reduced neutrophil count.  The potassium was 3.4, but the basic metabolic panel was otherwise normal.  The comprehensive metabolic panel was also normal.  The urinalysis was negative.  As noted, the serial CPKs did decrease.  Her carotid Dopplers revealed no internal carotid artery stenosis.  The left internal carotid artery was tortuous.  MRI of the brain revealed nonspecific white matter changes with no acute  process.  On the day of discharge, she had no chest pain and no paresthesias.  Her pulse rate varied from 49-59.  The blood pressure ranged from 102-148/50-89.  O2 saturations were 100% on 1 L.  Clinically she exhibited chest wall tenderness to palpation.  Because of the resolution of the acute symptoms, arrangements were made for discharge.  She was to return to work on October 08, 2000.  She was asked to see Sonda Primes, M.D., on Thursday, October 11, 2000, after the stress Cardiolite.  Pending at the time of discharge was a repeat CPK, sedimentation rate, and RA.  She was discharged on atenolol 12.5 mg once daily, coated aspirin 325 mg daily, and glucosamine sulfate 500 mg three daily for four to six weeks.  If the chest wall pain persists, then a limited bone scan could be pursued. If she remains profoundly bradycardic or hypotensive on t his low-dose beta blocker, then this antihypertensive medication can be changed to another class of drugs.  Her discharge status is improved.  Her prognosis appears good.  She will be on a no added salt diet.  DD:  10/03/00 TD:  10/03/00 Job: [id number]

## 2010-08-05 NOTE — H&P (Signed)
[name], [name]                   ACCOUNT NO.:  [id number]   MEDICAL RECORD NO.:  [id number]          PATIENT TYPE:  INP   LOCATION:  1824                         FACILITY:  [facility]   PHYSICIAN:  Elliot Cousin, M.D.    DATE OF BIRTH:  1955-08-16   DATE OF ADMISSION:  06/12/2006  DATE OF DISCHARGE:                              HISTORY & PHYSICAL   PRIMARY CARE PHYSICIAN:  Dr. Fannie Knee Drinkard, [facility].   CHIEF COMPLAINT:  Nausea, vomiting, blood specks and coffee-grounds  consistency in the emesis and productive cough.   HISTORY OF PRESENT ILLNESS:  The patient is a 55 year old woman with a  past medical history significant for left lower extremity DVT diagnosed  in February 2008, history of chest pain with negative Cardiolite stress  test in October 2007, and asthma, who presents to the emergency  department with a chief complaint of nausea and vomiting.  The patient  has also had a productive cough.  She has had nausea and vomiting  intermittently for the past 4 days.  She estimates vomiting at least 4-5  times daily over the last 3-4 days.  Yesterday, however, she noticed  that she had blood streaks in her emesis.  The color was a dark maroon  color with lightly colored red blood.  Her cough is productive with  clear and yellow sputum without any evidence of blood in her sputum.  She has had subjective fever and chills, a poor appetite,  lightheadedness when she stands, and a generalized headache.  She denies  abdominal cramping, painful urination, diarrhea, bright red blood per  rectum and black tarry stools.  She has, however, been constipated; her  last bowel movement was approximately 3-4 days ago.  She actually  presented to the emergency department on June 10, 2006.  At that time,  she was given a tentative diagnosis of acute bronchitis.  Tussionex was  prescribed; however, the patient did not take it because of her history  of cocaine abuse.  She presented to her  primary care physician's office  yesterday and was given 2 shots.  She says one was for nausea and she  does not recall what the other medication was.   During the evaluation in the emergency department, the patient is noted  to be mildly tachycardia, otherwise hemodynamically stable.  Per the  emergency department physician, Dr. Lynelle Doctor, the patient was witnessed to  have a small amount of coffee-grounds emesis.  Her lab data are  significant for a WBC of 2.8, hemoglobin 13.9, INR 2.2, potassium 2.6,  glucose 176, BUN 30 and creatinine of 1.3.  The chest x-ray reveals  faint opacity, right upper lobe, that may represent early atelectasis  versus airspace disease.  The patient will be admitted for further  evaluation and management.   PAST MEDICAL HISTORY:  1. Left lower extremity DVT diagnosed in February 2008.  She was      started on Coumadin therapy at that time.  2. History of chest pain with a negative Cardiolite study in October      2007.  Two-dimensional echocardiogram at that time revealed an      ejection fraction of 60%.  3. Hypertension.  4. Hyperlipidemia.  5. Stable adrenal nodule on the right measuring 1.9 cm per CT scan of      the abdomen, October 2007.  6. Gastroesophageal reflux disease.  7. History of cocaine abuse, abstinent for greater than 2 years.  8. Depression.  9. Asthma.  10.Thyroid nodule, status post biopsy by Radiology, October 2007.  The      pathology report revealed findings consistent with a hyperplastic      nodule.  11.Status post cervical disk surgery in 2003.  12.Status post C-section in the past.   MEDICATIONS:  1. Clonidine, ? dose, half a tablet b.i.d.  2. Coumadin 10 mg daily.  3. Guaifenesin DM 10 mL every 4 hours as needed.  4. Lipitor 20 mg daily.  5. Lisinopril/hydrochlorothiazide 20/25 mg daily.  6. Prozac 20 mg daily.  7. Seroquel, ? dose, she believes 50 mg nightly.  8. Ultracet 50 mg two to three times weekly p.r.n.  9.  Albuterol MDI two puffs as needed.   ALLERGIES:  No known drug allergies.   SOCIAL HISTORY:  The patient is divorced.  She lives in [place] with  her son.  She has 3 children in all.  She is unemployed.  She is seeking  disability.  She denies tobacco, alcohol and illicit drug use.  She is a  recovering cocaine addict.   FAMILY HISTORY:  Her father is 62 years of age and has a history of  hypertension and diabetes mellitus.  Her mother is 62 years of age and  has a history of degenerative joint disease, stroke and possible atrial  fibrillation.   REVIEW OF SYSTEMS:  Review of systems is positive for what is above in  the history of present illness.  In addition, the patient has had  intermittent pleurisy on the right and arthritic pain in her back, her  legs and her knees.   PHYSICAL EXAMINATION:  VITAL SIGNS:  Temperature 98.6, blood pressure  127/78, pulse 81, respiratory rate 20, oxygen saturation 100% on 2 L of  nasal cannula oxygen.  GENERAL:  The patient is a pleasant 55 year old obese African American  woman who is currently sitting up in bed in no acute distress.  HEENT:  Normocephalic and atraumatic.  Pupils are equal, round and  reactive to light.  Extraocular movements are intact.  Conjunctivae are  clear.  Sclerae are white.  Nasal mucosa is dry.  Oropharynx reveals  mildly dry mucous membranes.  Dentition is fair to poor.  She has  several broken-off teeth.  No posterior exudates or erythema.  NECK:  Supple.  No adenopathy.  No thyromegaly.  No bruit.  No JVD.  There is a well-healed horizontal scar.  LUNGS:  A few scattered wheezes and crackles bilaterally.  Breathing is  nonlabored.  HEART:  S1 and S2 with a soft systolic murmur.  ABDOMEN:  Obese.  Positive bowel sounds.  Soft, nontender and non-  distended.  No hepatosplenomegaly and no masses palpated.  EXTREMITIES:  Pedal pulses 2+ bilaterally.  Trace of pedal edema bilaterally.  Mild bilateral calf  tenderness without any appreciable  edema or erythema.  NEUROLOGIC:  The patient is alert and oriented x3.  Cranial nerves II-  XII are intact.   ADMISSION LABORATORY AND ACCESSORY CLINICAL DATA:  Chest x-ray:  Results  are above.   Blood cultures pending.  PT 25.9,  INR 2.2.  WBC 2.8, hemoglobin 13.9,  platelets 234,000.  Sodium 138, potassium 2.6, chloride 101, CO2 23,  glucose 176, BUN 30, creatinine 1.29, calcium 8.7.   ASSESSMENT:  1. Probable right upper lobe pneumonia with a recent diagnosis of      acute bronchitis.  2. Leukopenia.  The patient's white blood cell count is 2.8.  In      February, her white blood cell count was 3.9.  3. Hypokalemia.  More than likely, the hypokalemia is secondary to      vomiting.  4. Mild hematemesis (coffee-grounds emesis).  The nausea and vomiting      and evidence of coffee-grounds emesis have currently stopped.  5. Left lower extremity deep venous thrombosis, on chronic      anticoagulation.  The patient's INR is therapeutic at 2.2.  6. Renal insufficiency consistent with prerenal azotemia.  The      patient's BUN is 30 and her creatinine is 1.3.  In February, her      creatinine was 0.8 and her BUN was 6.   PLAN:  1. The patient will be admitted for further evaluation and management.  2. Rocephin and azithromycin were given in the emergency department.      We will continue antibiotic therapy with Rocephin and azithromycin      as previously ordered.  3. Albuterol and Atrovent nebulizers.  4. IV Reglan 5 mg q.6 h. for 24 hours and p.r.n. thereafter.  Also,      p.r.n. Zofran will be given for nausea and vomiting.  5. We will start intravenous Protonix q.12 h.  6. We will limit the patient's oral  medications.  We will start a      clonidine patch until she is able to take medications without      nausea and vomiting.  We will start her on sips of clear liquids.  7. We will hold the Coumadin for 24 hours or more and monitor the       patient's PT, INR and CBC.  8. Consider GI consult if the hematemesis returns.  9. Replete potassium chloride in the IV fluids.  Intravenous potassium      runs were ordered by the emergency department physician; however,      the patient could not tolerate them.  We will therefore add      additional potassium to the patient's IV fluids.  We will check a      magnesium level to rule out magnesium deficiency.  10.We will check a lipase to assess for pancreatitis, although the      patient does not appear to be tender in the epigastrium.  11.We will check LFTs.  12.We will check blood cultures x2, now pending.      Elliot Cousin, M.D.  Electronically Signed     DF/MEDQ  D:  06/13/2006  T:  06/13/2006  Job:  [id number]   cc:   [facility] Drinkard, Fannie Knee MD

## 2010-08-22 ENCOUNTER — Other Ambulatory Visit: Payer: Self-pay | Admitting: Oncology

## 2010-08-22 ENCOUNTER — Encounter (HOSPITAL_BASED_OUTPATIENT_CLINIC_OR_DEPARTMENT_OTHER): Payer: Medicare Other | Admitting: Oncology

## 2010-08-22 DIAGNOSIS — Z7901 Long term (current) use of anticoagulants: Secondary | ICD-10-CM

## 2010-08-22 DIAGNOSIS — D6859 Other primary thrombophilia: Secondary | ICD-10-CM

## 2010-08-22 DIAGNOSIS — Z86718 Personal history of other venous thrombosis and embolism: Secondary | ICD-10-CM

## 2010-08-22 LAB — PROTIME-INR
INR: 1.8 — ABNORMAL LOW (ref 2.00–3.50)
Protime: 21.6 Seconds — ABNORMAL HIGH (ref 10.6–13.4)

## 2010-09-20 ENCOUNTER — Other Ambulatory Visit: Payer: Self-pay | Admitting: Oncology

## 2010-09-20 ENCOUNTER — Encounter (HOSPITAL_BASED_OUTPATIENT_CLINIC_OR_DEPARTMENT_OTHER): Payer: Medicare Other | Admitting: Oncology

## 2010-09-20 DIAGNOSIS — Z7901 Long term (current) use of anticoagulants: Secondary | ICD-10-CM

## 2010-09-20 DIAGNOSIS — Z86718 Personal history of other venous thrombosis and embolism: Secondary | ICD-10-CM

## 2010-09-20 DIAGNOSIS — D6859 Other primary thrombophilia: Secondary | ICD-10-CM

## 2010-09-20 LAB — PROTIME-INR
INR: 2.9 (ref 2.00–3.50)
Protime: 34.8 Seconds — ABNORMAL HIGH (ref 10.6–13.4)

## 2010-10-24 ENCOUNTER — Encounter (HOSPITAL_BASED_OUTPATIENT_CLINIC_OR_DEPARTMENT_OTHER): Payer: Medicare Other | Admitting: Oncology

## 2010-10-24 ENCOUNTER — Other Ambulatory Visit: Payer: Self-pay | Admitting: Oncology

## 2010-10-24 DIAGNOSIS — D6859 Other primary thrombophilia: Secondary | ICD-10-CM

## 2010-10-24 DIAGNOSIS — Z86718 Personal history of other venous thrombosis and embolism: Secondary | ICD-10-CM

## 2010-10-24 DIAGNOSIS — Z7901 Long term (current) use of anticoagulants: Secondary | ICD-10-CM

## 2010-10-24 LAB — PROTIME-INR
INR: 3.9 — ABNORMAL HIGH (ref 2.00–3.50)
Protime: 46.8 Seconds — ABNORMAL HIGH (ref 10.6–13.4)

## 2010-11-01 ENCOUNTER — Encounter (HOSPITAL_BASED_OUTPATIENT_CLINIC_OR_DEPARTMENT_OTHER): Payer: Medicare Other | Admitting: Oncology

## 2010-11-01 ENCOUNTER — Other Ambulatory Visit: Payer: Self-pay | Admitting: Oncology

## 2010-11-01 DIAGNOSIS — Z7901 Long term (current) use of anticoagulants: Secondary | ICD-10-CM

## 2010-11-01 DIAGNOSIS — D6859 Other primary thrombophilia: Secondary | ICD-10-CM

## 2010-11-01 DIAGNOSIS — Z5181 Encounter for therapeutic drug level monitoring: Secondary | ICD-10-CM

## 2010-11-01 LAB — PROTIME-INR
INR: 2.7 (ref 2.00–3.50)
Protime: 32.4 Seconds — ABNORMAL HIGH (ref 10.6–13.4)

## 2010-11-17 ENCOUNTER — Encounter (HOSPITAL_COMMUNITY)
Admission: RE | Admit: 2010-11-17 | Discharge: 2010-11-17 | Disposition: A | Discharge status: Home / Self Care | Payer: Medicare Other | Source: Ambulatory Visit | Attending: Orthopedic Surgery | Admitting: Orthopedic Surgery

## 2010-11-17 LAB — PROTIME-INR
INR: 2.34 — ABNORMAL HIGH (ref 0.00–1.49)
Prothrombin Time: 26 seconds — ABNORMAL HIGH (ref 11.6–15.2)

## 2010-11-17 LAB — CBC
HCT: 34.5 % — ABNORMAL LOW (ref 36.0–46.0)
Hemoglobin: 11.2 g/dL — ABNORMAL LOW (ref 12.0–15.0)
MCH: 26.5 pg (ref 26.0–34.0)
MCHC: 32.5 g/dL (ref 30.0–36.0)
MCV: 81.8 fL (ref 78.0–100.0)
Platelets: 346 10*3/uL (ref 150–400)
RBC: 4.22 MIL/uL (ref 3.87–5.11)
RDW: 17.1 % — ABNORMAL HIGH (ref 11.5–15.5)
WBC: 4.7 10*3/uL (ref 4.0–10.5)

## 2010-11-17 LAB — DIFFERENTIAL
Basophils Absolute: 0 10*3/uL (ref 0.0–0.1)
Basophils Relative: 0 % (ref 0–1)
Eosinophils Absolute: 0.2 10*3/uL (ref 0.0–0.7)
Eosinophils Relative: 4 % (ref 0–5)
Lymphocytes Relative: 40 % (ref 12–46)
Lymphs Abs: 1.9 10*3/uL (ref 0.7–4.0)
Monocytes Absolute: 0.3 10*3/uL (ref 0.1–1.0)
Monocytes Relative: 7 % (ref 3–12)
Neutro Abs: 2.3 10*3/uL (ref 1.7–7.7)
Neutrophils Relative %: 49 % (ref 43–77)

## 2010-11-17 LAB — SURGICAL PCR SCREEN
MRSA, PCR: NEGATIVE
Staphylococcus aureus: NEGATIVE

## 2010-11-17 LAB — BASIC METABOLIC PANEL
BUN: 13 mg/dL (ref 6–23)
CO2: 28 mEq/L (ref 19–32)
Calcium: 9.3 mg/dL (ref 8.4–10.5)
Chloride: 103 mEq/L (ref 96–112)
Creatinine, Ser: 0.64 mg/dL (ref 0.50–1.10)
GFR calc Af Amer: >60 mL/min (ref >60)
GFR calc non Af Amer: >60 mL/min (ref >60)
Glucose, Bld: 138 mg/dL — ABNORMAL HIGH (ref 70–99)
Potassium: 3.5 mEq/L (ref 3.5–5.1)
Sodium: 141 mEq/L (ref 135–145)

## 2010-11-17 LAB — URINALYSIS, ROUTINE W REFLEX MICROSCOPIC
Glucose, UA: NEGATIVE mg/dL
Hgb urine dipstick: NEGATIVE
Ketones, ur: NEGATIVE mg/dL
Leukocytes, UA: NEGATIVE
Nitrite: NEGATIVE
Protein, ur: NEGATIVE mg/dL
Specific Gravity, Urine: 1.02 (ref 1.005–1.030)
Urobilinogen, UA: 1 mg/dL (ref 0.0–1.0)
pH: 6 (ref 5.0–8.0)

## 2010-11-18 LAB — URINE CULTURE
Colony Count: 15000
Culture  Setup Time: 201208301521

## 2010-11-25 ENCOUNTER — Other Ambulatory Visit (HOSPITAL_COMMUNITY): Payer: Self-pay | Admitting: Orthopedic Surgery

## 2010-11-25 DIAGNOSIS — I82409 Acute embolism and thrombosis of unspecified deep veins of unspecified lower extremity: Secondary | ICD-10-CM

## 2010-11-29 ENCOUNTER — Ambulatory Visit (HOSPITAL_COMMUNITY)
Admission: RE | Admit: 2010-11-29 | Discharge: 2010-11-29 | Disposition: A | Discharge status: Home / Self Care | Payer: Medicare Other | Source: Ambulatory Visit | Attending: Orthopedic Surgery | Admitting: Orthopedic Surgery

## 2010-11-29 ENCOUNTER — Inpatient Hospital Stay (HOSPITAL_COMMUNITY)
Admission: RE | Admit: 2010-11-29 | Discharge: 2010-12-05 | DRG: 470 | Disposition: A | Discharge status: Home Health | Payer: Medicare Other | Source: Ambulatory Visit | Attending: Orthopedic Surgery | Admitting: Orthopedic Surgery

## 2010-11-29 ENCOUNTER — Inpatient Hospital Stay (HOSPITAL_COMMUNITY): Payer: Medicare Other

## 2010-11-29 DIAGNOSIS — E669 Obesity, unspecified: Secondary | ICD-10-CM | POA: Diagnosis present

## 2010-11-29 DIAGNOSIS — F329 Major depressive disorder, single episode, unspecified: Secondary | ICD-10-CM | POA: Diagnosis present

## 2010-11-29 DIAGNOSIS — Z01812 Encounter for preprocedural laboratory examination: Secondary | ICD-10-CM

## 2010-11-29 DIAGNOSIS — J449 Chronic obstructive pulmonary disease, unspecified: Secondary | ICD-10-CM | POA: Diagnosis present

## 2010-11-29 DIAGNOSIS — Z79899 Other long term (current) drug therapy: Secondary | ICD-10-CM

## 2010-11-29 DIAGNOSIS — D62 Acute posthemorrhagic anemia: Secondary | ICD-10-CM | POA: Diagnosis not present

## 2010-11-29 DIAGNOSIS — Z9981 Dependence on supplemental oxygen: Secondary | ICD-10-CM

## 2010-11-29 DIAGNOSIS — G4733 Obstructive sleep apnea (adult) (pediatric): Secondary | ICD-10-CM | POA: Diagnosis present

## 2010-11-29 DIAGNOSIS — I10 Essential (primary) hypertension: Secondary | ICD-10-CM | POA: Insufficient documentation

## 2010-11-29 DIAGNOSIS — K219 Gastro-esophageal reflux disease without esophagitis: Secondary | ICD-10-CM | POA: Diagnosis present

## 2010-11-29 DIAGNOSIS — I82409 Acute embolism and thrombosis of unspecified deep veins of unspecified lower extremity: Secondary | ICD-10-CM

## 2010-11-29 DIAGNOSIS — F3289 Other specified depressive episodes: Secondary | ICD-10-CM | POA: Diagnosis present

## 2010-11-29 DIAGNOSIS — Z01810 Encounter for preprocedural cardiovascular examination: Secondary | ICD-10-CM

## 2010-11-29 DIAGNOSIS — Z7901 Long term (current) use of anticoagulants: Secondary | ICD-10-CM

## 2010-11-29 DIAGNOSIS — F411 Generalized anxiety disorder: Secondary | ICD-10-CM | POA: Diagnosis present

## 2010-11-29 DIAGNOSIS — M1710 Unilateral primary osteoarthritis, unspecified knee: Principal | ICD-10-CM | POA: Diagnosis present

## 2010-11-29 DIAGNOSIS — E119 Type 2 diabetes mellitus without complications: Secondary | ICD-10-CM | POA: Diagnosis present

## 2010-11-29 DIAGNOSIS — J4489 Other specified chronic obstructive pulmonary disease: Secondary | ICD-10-CM | POA: Diagnosis present

## 2010-11-29 LAB — PROTIME-INR
INR: 1.05 (ref 0.00–1.49)
Prothrombin Time: 13.9 seconds (ref 11.6–15.2)

## 2010-11-29 LAB — GLUCOSE, CAPILLARY
Glucose-Capillary: 116 mg/dL — ABNORMAL HIGH (ref 70–99)
Glucose-Capillary: 116 mg/dL — ABNORMAL HIGH (ref 70–99)
Glucose-Capillary: 180 mg/dL — ABNORMAL HIGH (ref 70–99)
Glucose-Capillary: 160 mg/dL — ABNORMAL HIGH (ref 70–99)

## 2010-11-29 MED ORDER — IOHEXOL 300 MG/ML  SOLN
100.0000 mL | Freq: Once | INTRAMUSCULAR | Status: AC | PRN
  Administered 2010-11-29: 30 mL via INTRAVENOUS

## 2010-11-30 LAB — BASIC METABOLIC PANEL
BUN: 9 mg/dL (ref 6–23)
CO2: 27 mEq/L (ref 19–32)
Calcium: 8.1 mg/dL — ABNORMAL LOW (ref 8.4–10.5)
Chloride: 103 mEq/L (ref 96–112)
Creatinine, Ser: 0.59 mg/dL (ref 0.50–1.10)
GFR calc Af Amer: >60 mL/min (ref >60)
GFR calc non Af Amer: >60 mL/min (ref >60)
Glucose, Bld: 165 mg/dL — ABNORMAL HIGH (ref 70–99)
Potassium: 3.3 mEq/L — ABNORMAL LOW (ref 3.5–5.1)
Sodium: 138 mEq/L (ref 135–145)

## 2010-11-30 LAB — CBC
HCT: 25.5 % — ABNORMAL LOW (ref 36.0–46.0)
Hemoglobin: 8.4 g/dL — ABNORMAL LOW (ref 12.0–15.0)
MCH: 27.1 pg (ref 26.0–34.0)
MCHC: 32.9 g/dL (ref 30.0–36.0)
MCV: 82.3 fL (ref 78.0–100.0)
Platelets: 250 10*3/uL (ref 150–400)
RBC: 3.1 MIL/uL — ABNORMAL LOW (ref 3.87–5.11)
RDW: 17 % — ABNORMAL HIGH (ref 11.5–15.5)
WBC: 7.4 10*3/uL (ref 4.0–10.5)

## 2010-11-30 LAB — GLUCOSE, CAPILLARY
Glucose-Capillary: 158 mg/dL — ABNORMAL HIGH (ref 70–99)
Glucose-Capillary: 145 mg/dL — ABNORMAL HIGH (ref 70–99)
Glucose-Capillary: 154 mg/dL — ABNORMAL HIGH (ref 70–99)
Glucose-Capillary: 124 mg/dL — ABNORMAL HIGH (ref 70–99)

## 2010-11-30 LAB — PROTIME-INR
INR: 1.07 (ref 0.00–1.49)
Prothrombin Time: 14.1 seconds (ref 11.6–15.2)

## 2010-12-01 LAB — BASIC METABOLIC PANEL
BUN: 16 mg/dL (ref 6–23)
CO2: 27 mEq/L (ref 19–32)
Calcium: 9.1 mg/dL (ref 8.4–10.5)
Chloride: 100 mEq/L (ref 96–112)
Creatinine, Ser: 0.88 mg/dL (ref 0.50–1.10)
GFR calc Af Amer: >60 mL/min (ref >60)
GFR calc non Af Amer: >60 mL/min (ref >60)
Glucose, Bld: 143 mg/dL — ABNORMAL HIGH (ref 70–99)
Potassium: 3.8 mEq/L (ref 3.5–5.1)
Sodium: 136 mEq/L (ref 135–145)

## 2010-12-01 LAB — PROTIME-INR
INR: 1.32 (ref 0.00–1.49)
Prothrombin Time: 16.6 seconds — ABNORMAL HIGH (ref 11.6–15.2)

## 2010-12-01 LAB — CBC
HCT: 26.1 % — ABNORMAL LOW (ref 36.0–46.0)
Hemoglobin: 8.3 g/dL — ABNORMAL LOW (ref 12.0–15.0)
MCH: 26.3 pg (ref 26.0–34.0)
MCHC: 31.8 g/dL (ref 30.0–36.0)
MCV: 82.9 fL (ref 78.0–100.0)
Platelets: 264 10*3/uL (ref 150–400)
RBC: 3.15 MIL/uL — ABNORMAL LOW (ref 3.87–5.11)
RDW: 16.9 % — ABNORMAL HIGH (ref 11.5–15.5)
WBC: 7.4 10*3/uL (ref 4.0–10.5)

## 2010-12-02 ENCOUNTER — Inpatient Hospital Stay (HOSPITAL_COMMUNITY): Payer: Medicare Other

## 2010-12-02 LAB — GLUCOSE, CAPILLARY
Glucose-Capillary: 139 mg/dL — ABNORMAL HIGH (ref 70–99)
Glucose-Capillary: 143 mg/dL — ABNORMAL HIGH (ref 70–99)
Glucose-Capillary: 151 mg/dL — ABNORMAL HIGH (ref 70–99)
Glucose-Capillary: 132 mg/dL — ABNORMAL HIGH (ref 70–99)
Glucose-Capillary: 116 mg/dL — ABNORMAL HIGH (ref 70–99)
Glucose-Capillary: 129 mg/dL — ABNORMAL HIGH (ref 70–99)
Glucose-Capillary: 115 mg/dL — ABNORMAL HIGH (ref 70–99)

## 2010-12-02 LAB — CBC
HCT: 24 % — ABNORMAL LOW (ref 36.0–46.0)
Hemoglobin: 7.7 g/dL — ABNORMAL LOW (ref 12.0–15.0)
MCH: 26.4 pg (ref 26.0–34.0)
MCHC: 32.1 g/dL (ref 30.0–36.0)
MCV: 82.2 fL (ref 78.0–100.0)
Platelets: 241 10*3/uL (ref 150–400)
RBC: 2.92 MIL/uL — ABNORMAL LOW (ref 3.87–5.11)
RDW: 16.7 % — ABNORMAL HIGH (ref 11.5–15.5)
WBC: 7.1 10*3/uL (ref 4.0–10.5)

## 2010-12-02 LAB — PROTIME-INR
INR: 1.6 — ABNORMAL HIGH (ref 0.00–1.49)
Prothrombin Time: 19.3 seconds — ABNORMAL HIGH (ref 11.6–15.2)

## 2010-12-03 LAB — TYPE AND SCREEN
ABO/RH(D): A POS
Antibody Screen: POSITIVE
DAT, IgG: NEGATIVE
Donor AG Type: NEGATIVE
Donor AG Type: NEGATIVE
Unit division: 0
Unit division: 0

## 2010-12-03 LAB — GLUCOSE, CAPILLARY
Glucose-Capillary: 137 mg/dL — ABNORMAL HIGH (ref 70–99)
Glucose-Capillary: 104 mg/dL — ABNORMAL HIGH (ref 70–99)
Glucose-Capillary: 138 mg/dL — ABNORMAL HIGH (ref 70–99)
Glucose-Capillary: 122 mg/dL — ABNORMAL HIGH (ref 70–99)

## 2010-12-03 LAB — PROTIME-INR
INR: 1.51 — ABNORMAL HIGH (ref 0.00–1.49)
Prothrombin Time: 18.5 seconds — ABNORMAL HIGH (ref 11.6–15.2)

## 2010-12-04 LAB — PROTIME-INR
INR: 1.38 (ref 0.00–1.49)
Prothrombin Time: 17.2 seconds — ABNORMAL HIGH (ref 11.6–15.2)

## 2010-12-04 LAB — GLUCOSE, CAPILLARY
Glucose-Capillary: 121 mg/dL — ABNORMAL HIGH (ref 70–99)
Glucose-Capillary: 107 mg/dL — ABNORMAL HIGH (ref 70–99)
Glucose-Capillary: 134 mg/dL — ABNORMAL HIGH (ref 70–99)
Glucose-Capillary: 101 mg/dL — ABNORMAL HIGH (ref 70–99)

## 2010-12-05 LAB — PROTIME-INR
INR: 1.35 (ref 0.00–1.49)
Prothrombin Time: 16.9 seconds — ABNORMAL HIGH (ref 11.6–15.2)

## 2010-12-05 LAB — GLUCOSE, CAPILLARY
Glucose-Capillary: 113 mg/dL — ABNORMAL HIGH (ref 70–99)
Glucose-Capillary: 111 mg/dL — ABNORMAL HIGH (ref 70–99)
Glucose-Capillary: 108 mg/dL — ABNORMAL HIGH (ref 70–99)

## 2010-12-27 LAB — DIFFERENTIAL
Basophils Absolute: 0
Basophils Relative: 0
Eosinophils Absolute: 0.2
Eosinophils Relative: 3
Lymphocytes Relative: 39
Lymphs Abs: 2.3
Monocytes Absolute: 0.5
Monocytes Relative: 8
Neutro Abs: 2.9
Neutrophils Relative %: 49

## 2010-12-27 LAB — POCT I-STAT CREATININE
Creatinine, Ser: 0.9
Operator id: 161631

## 2010-12-27 LAB — CBC
HCT: 34.1 — ABNORMAL LOW
Hemoglobin: 11.3 — ABNORMAL LOW
MCHC: 33.3
MCV: 87.4
Platelets: 278
RBC: 3.9
RDW: 15.7 — ABNORMAL HIGH
WBC: 5.9

## 2010-12-27 LAB — I-STAT 8, (EC8 V) (CONVERTED LAB)
Acid-Base Excess: 4 — ABNORMAL HIGH
BUN: 15
Bicarbonate: 30.6 — ABNORMAL HIGH
Chloride: 104
Glucose, Bld: 107 — ABNORMAL HIGH
HCT: 39
Hemoglobin: 13.3
Operator id: 161631
Potassium: 3.5
Sodium: 139
TCO2: 32
pCO2, Ven: 55 — ABNORMAL HIGH
pH, Ven: 7.353 — ABNORMAL HIGH

## 2010-12-27 LAB — SAMPLE TO BLOOD BANK

## 2010-12-27 LAB — OCCULT BLOOD X 1 CARD TO LAB, STOOL: Fecal Occult Bld: POSITIVE

## 2010-12-28 NOTE — Op Note (Signed)
NAMECURSTIN, SCHMALE                   ACCOUNT NO.:  [id number]  MEDICAL RECORD NO.:  [id number]  LOCATION:  2609                         FACILITY:  [facility]  PHYSICIAN:  Burnard Bunting, M.D.    DATE OF BIRTH:  10-06-1955  DATE OF PROCEDURE:  11/29/2010 DATE OF DISCHARGE:                              OPERATIVE REPORT   PREOPERATIVE DIAGNOSIS:  Right knee arthritis.  POSTOPERATIVE DIAGNOSIS:  Right knee arthritis.  PROCEDURE:  Right total knee replacement.  SURGEON:  Burnard Bunting, MD  ASSISTANT:  Wende Neighbors, PA  ANESTHESIA:  General endotracheal.  ESTIMATED BLOOD LOSS:  100 mL.  DRAINS:  Hemovac x1.  TOURNIQUET TIME:  2 hours at 300 mmHg for about the first hour and 350 mmHg for the second hour.  IMPLANTS:  DePuy PCL sacrificing, 3 femur, 2.5 tibia, 12.5 patella, 12.5 poly insert, 32 patella.  INDICATIONS:  [name] is a 55 year old patient with right knee arthritis who presents for operative management after explanation of risks and benefits, and failure with conservative management.  PROCEDURE IN DETAIL:  The patient was brought to the operating room where general endotracheal anesthesia was induced.  Preoperative antibiotics were administered.  Right leg was prescrubbed with alcohol and Betadine, and allowed it to air dry, prepped with DuraPrep solution including the foot and draped in sterile manner.  Collier Flowers was used to cover the operative field.  Time-out was called.  Leg was elevated and exsanguinated with an Esmarch wrap.  Tourniquet was inflated.  Anterior approach was utilized.  Skin and subcutaneous tissues were sharply divided.  Median parapatellar approach was made.  Precise location of the arthrotomy was marked with #1 Vicryl suture.  Fat pad was partially excised.  Lateral patellofemoral ligament was released.  Soft tissue was elevated off the anterior medial proximal tibia.  Soft tissue was also removed from the anterior distal femur.  Two pins were  placed in the distal medial femur and proximal medial tibia.  Registration points were achieved including hip center rotation, bimalleolar axis, and various points about the knee.  Tibial cut was then made with the collateral and cruciate ligaments well protected.  Cut was made.  Tensioning was then placed in full extension and flexion to 90 degrees.  Distal femoral cut was then made.  Good extension was achieved with a 10 poly spacer.  Box cut and chamfer cuts were made.  Patella was then cut freehand 20-12 mm. The tibia was keel punched.  Box cut was made on the femur.  Trial reduction was performed with components.  The patient achieved full extension, full flexion, excellent patellar tracking with no thumbs technique and good stability to varus-valgus stress at 0 to 30 and 90 degrees with a 12.5 poly spacer in position.  Trial components were removed.  Knee joint was irrigated with 3 liters of pulsatile solution. True components were then cemented into position.  Excess cement was removed.  Same stability and range of motion parameters were maintained. Tourniquet was released.  Bleeding points were encountered, which were controlled with electrocautery.  Hemovac drain was placed.  Incision was then closed using interrupted inverted #1 Vicryl suture, 0  Vicryl suture, 2-0 Vicryl suture, and a running 3-0 pullout Prolene and incisions for the pins were irrigated and closed using 3-0 nylon. Solution of Marcaine and morphine finally injected into the knee.  Bulky dressing, knee immobilizer was placed.  Velna Hatchet Vernon's assistance was required all times during the case.  For retraction of important neurovascular structures, limb positioning, opening, closing, her assistance was a medical necessity.     Burnard Bunting, M.D.     GSD/MEDQ  D:  11/29/2010  T:  11/30/2010  Job:  [id number]  Electronically Signed by Reece Agar.  DEAN M.D. on 12/28/2010 02:41:14 PM

## 2010-12-28 NOTE — Discharge Summary (Signed)
  NAMESYNDI, PUA                   ACCOUNT NO.:  [id number]  MEDICAL RECORD NO.:  [id number]  LOCATION:  5007                         FACILITY:  [facility]  PHYSICIAN:  Burnard Bunting, M.D.    DATE OF BIRTH:  December 30, 1955  DATE OF ADMISSION:  11/29/2010 DATE OF DISCHARGE:  12/05/2010                              DISCHARGE SUMMARY   DISCHARGE DIAGNOSIS:  Right knee arthritis.  SECONDARY DIAGNOSES:  Expected acute blood loss anemia and chronic obstructive pulmonary disease.  OPERATIONS AND PROCEDURES:  Right total knee replacement on November 29, 2010.  HOSPITAL COURSE:  [name] is a patient with the right knee arthritis. She underwent right total knee replacement on November 29, 2010, tolerated the procedure well.  She had an IVC filter placed preop and because of her history of deep vein thrombosis, started on Coumadin postop.  Physical Therapy saw the patient.  The patient had an unremarkable recovery.  She was mobilizing well with physical therapy by the time of discharge, INR 1.4.  At the time of discharge, again IVC filter is in place.  Incision was intact at the time of discharge.  She is going to continue with her Coumadin.  DISCHARGE MEDICATIONS: 1. Symbicort inhaler 1 puff b.i.d. 2. MiraLax 17 g p.o. daily. 3. Metformin 500 mg p.o. b.i.d. WC. 4. Coumadin 10 mg p.o. daily, INR 2-2.5. 5. Colace 100 mg p.o. b.i.d. 6. Claritin 10 mg p.o. daily. 7. Seroquel 50 mg p.o. b.i.d. 8. Diltiazem 180 mg p.o. daily. 9. Cymbalta 60 mg p.o. daily and 20 mg p.o. at bedtime. 10.Neurontin 300 mg p.o. t.i.d. 11.Clonidine 0.1 mg p.o. b.i.d. 12.Norvasc 5 mg p.o. daily. 13.Protonix 40 mg p.o. b.i.d. a.c. 14.Crestor 5 mg p.o. daily. 15.Albuterol 2 puffs inhaler q.i.d. p.r.n. 16.Robaxin 500 mg p.o. q.8 h. p.r.n. spasm. 17.Percocet 10/325 one p.o. q.3-4 h. p.r.n. pain.  She will follow up with me in 7 days for suture removal.  Weightbearing as tolerated.     Burnard Bunting,  M.D.     GSD/MEDQ  D:  12/05/2010  T:  12/06/2010  Job:  [id number]  Electronically Signed by Reece Agar.  DEAN M.D. on 12/28/2010 02:41:08 PM

## 2011-01-03 ENCOUNTER — Encounter (HOSPITAL_BASED_OUTPATIENT_CLINIC_OR_DEPARTMENT_OTHER): Payer: Medicare Other | Admitting: Oncology

## 2011-01-03 ENCOUNTER — Other Ambulatory Visit: Payer: Self-pay | Admitting: Oncology

## 2011-01-03 DIAGNOSIS — D6859 Other primary thrombophilia: Secondary | ICD-10-CM

## 2011-01-03 DIAGNOSIS — Z7901 Long term (current) use of anticoagulants: Secondary | ICD-10-CM

## 2011-01-03 DIAGNOSIS — Z86718 Personal history of other venous thrombosis and embolism: Secondary | ICD-10-CM

## 2011-01-03 LAB — PROTIME-INR
INR: 2.2 (ref 2.00–3.50)
Protime: 26.4 Seconds — ABNORMAL HIGH (ref 10.6–13.4)

## 2011-01-03 LAB — DIFFERENTIAL
Basophils Absolute: 0
Basophils Relative: 1
Eosinophils Absolute: 0.1
Eosinophils Relative: 3
Lymphocytes Relative: 45
Lymphs Abs: 2
Monocytes Absolute: 0.5
Monocytes Relative: 12 — ABNORMAL HIGH
Neutro Abs: 1.7
Neutrophils Relative %: 40 — ABNORMAL LOW

## 2011-01-03 LAB — CBC
HCT: 38.8
Hemoglobin: 12.9
MCHC: 33.2
MCV: 87.1
Platelets: 253
RBC: 4.45
RDW: 16 — ABNORMAL HIGH
WBC: 4.3

## 2011-01-03 LAB — I-STAT 8, (EC8 V) (CONVERTED LAB)
Acid-Base Excess: 6 — ABNORMAL HIGH
BUN: 7
Bicarbonate: 30 — ABNORMAL HIGH
Chloride: 103
Glucose, Bld: 98
HCT: 43
Hemoglobin: 14.6
Operator id: 189501
Potassium: 3.6
Sodium: 137
TCO2: 31
pCO2, Ven: 40.6 — ABNORMAL LOW
pH, Ven: 7.477 — ABNORMAL HIGH

## 2011-01-03 LAB — POCT I-STAT CREATININE
Creatinine, Ser: 0.8
Operator id: 189501

## 2011-01-03 LAB — D-DIMER, QUANTITATIVE (NOT AT ARMC): D-Dimer, Quant: 0.65 — ABNORMAL HIGH

## 2011-01-04 ENCOUNTER — Ambulatory Visit: Payer: Medicare Other | Attending: Orthopedic Surgery | Admitting: Physical Therapy

## 2011-01-04 DIAGNOSIS — M25569 Pain in unspecified knee: Secondary | ICD-10-CM | POA: Insufficient documentation

## 2011-01-04 DIAGNOSIS — R262 Difficulty in walking, not elsewhere classified: Secondary | ICD-10-CM | POA: Insufficient documentation

## 2011-01-04 DIAGNOSIS — Z96659 Presence of unspecified artificial knee joint: Secondary | ICD-10-CM | POA: Insufficient documentation

## 2011-01-04 DIAGNOSIS — M6281 Muscle weakness (generalized): Secondary | ICD-10-CM | POA: Insufficient documentation

## 2011-01-04 DIAGNOSIS — IMO0001 Reserved for inherently not codable concepts without codable children: Secondary | ICD-10-CM | POA: Insufficient documentation

## 2011-01-06 IMAGING — CR DG CHEST 2V
2 series · 2 of 2 positions shown · non-contrast
Comparison: 09/27/2006

CLINICAL DATA: Chest pain, cough.

CHEST - 2 VIEW

[w chest pa]
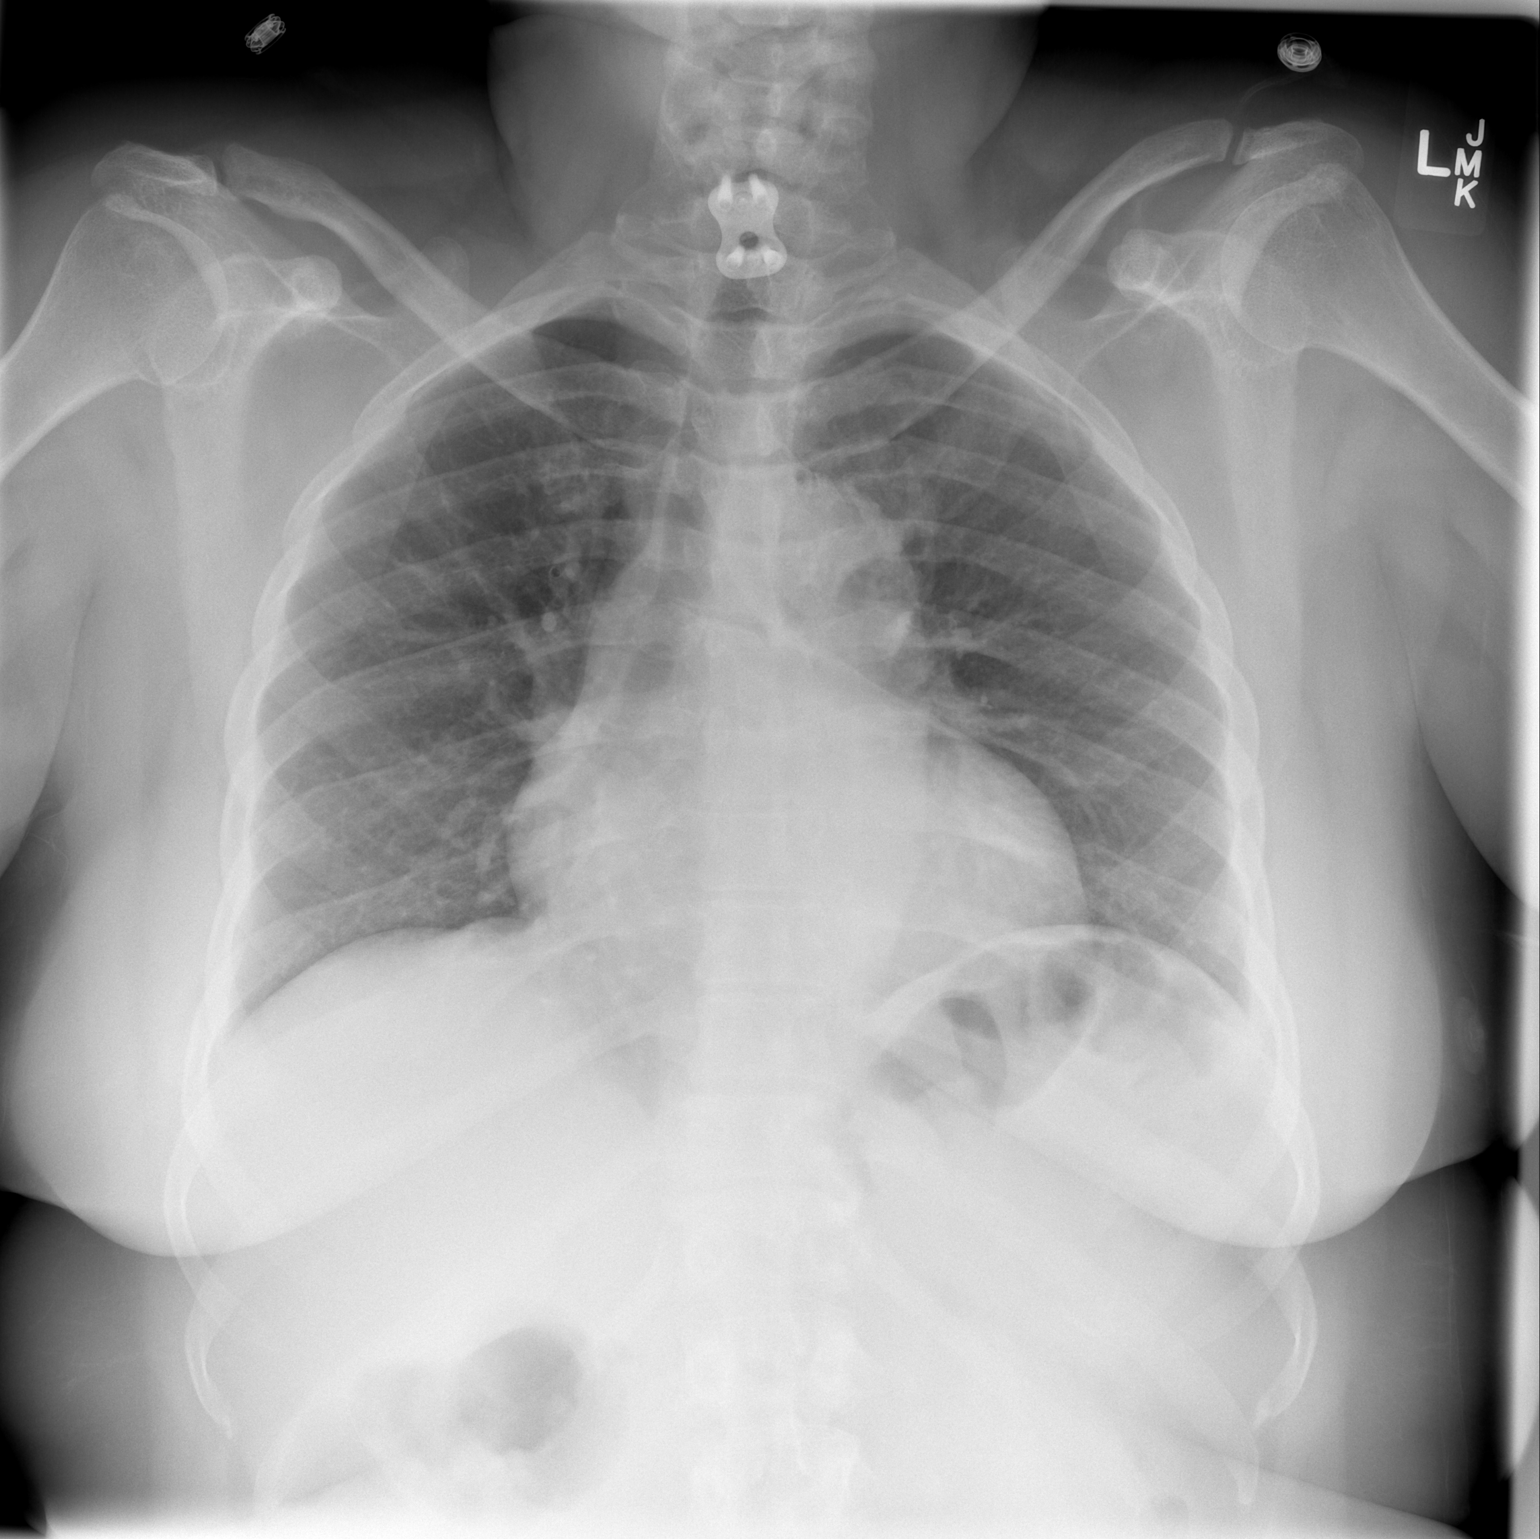

[w chest lat]
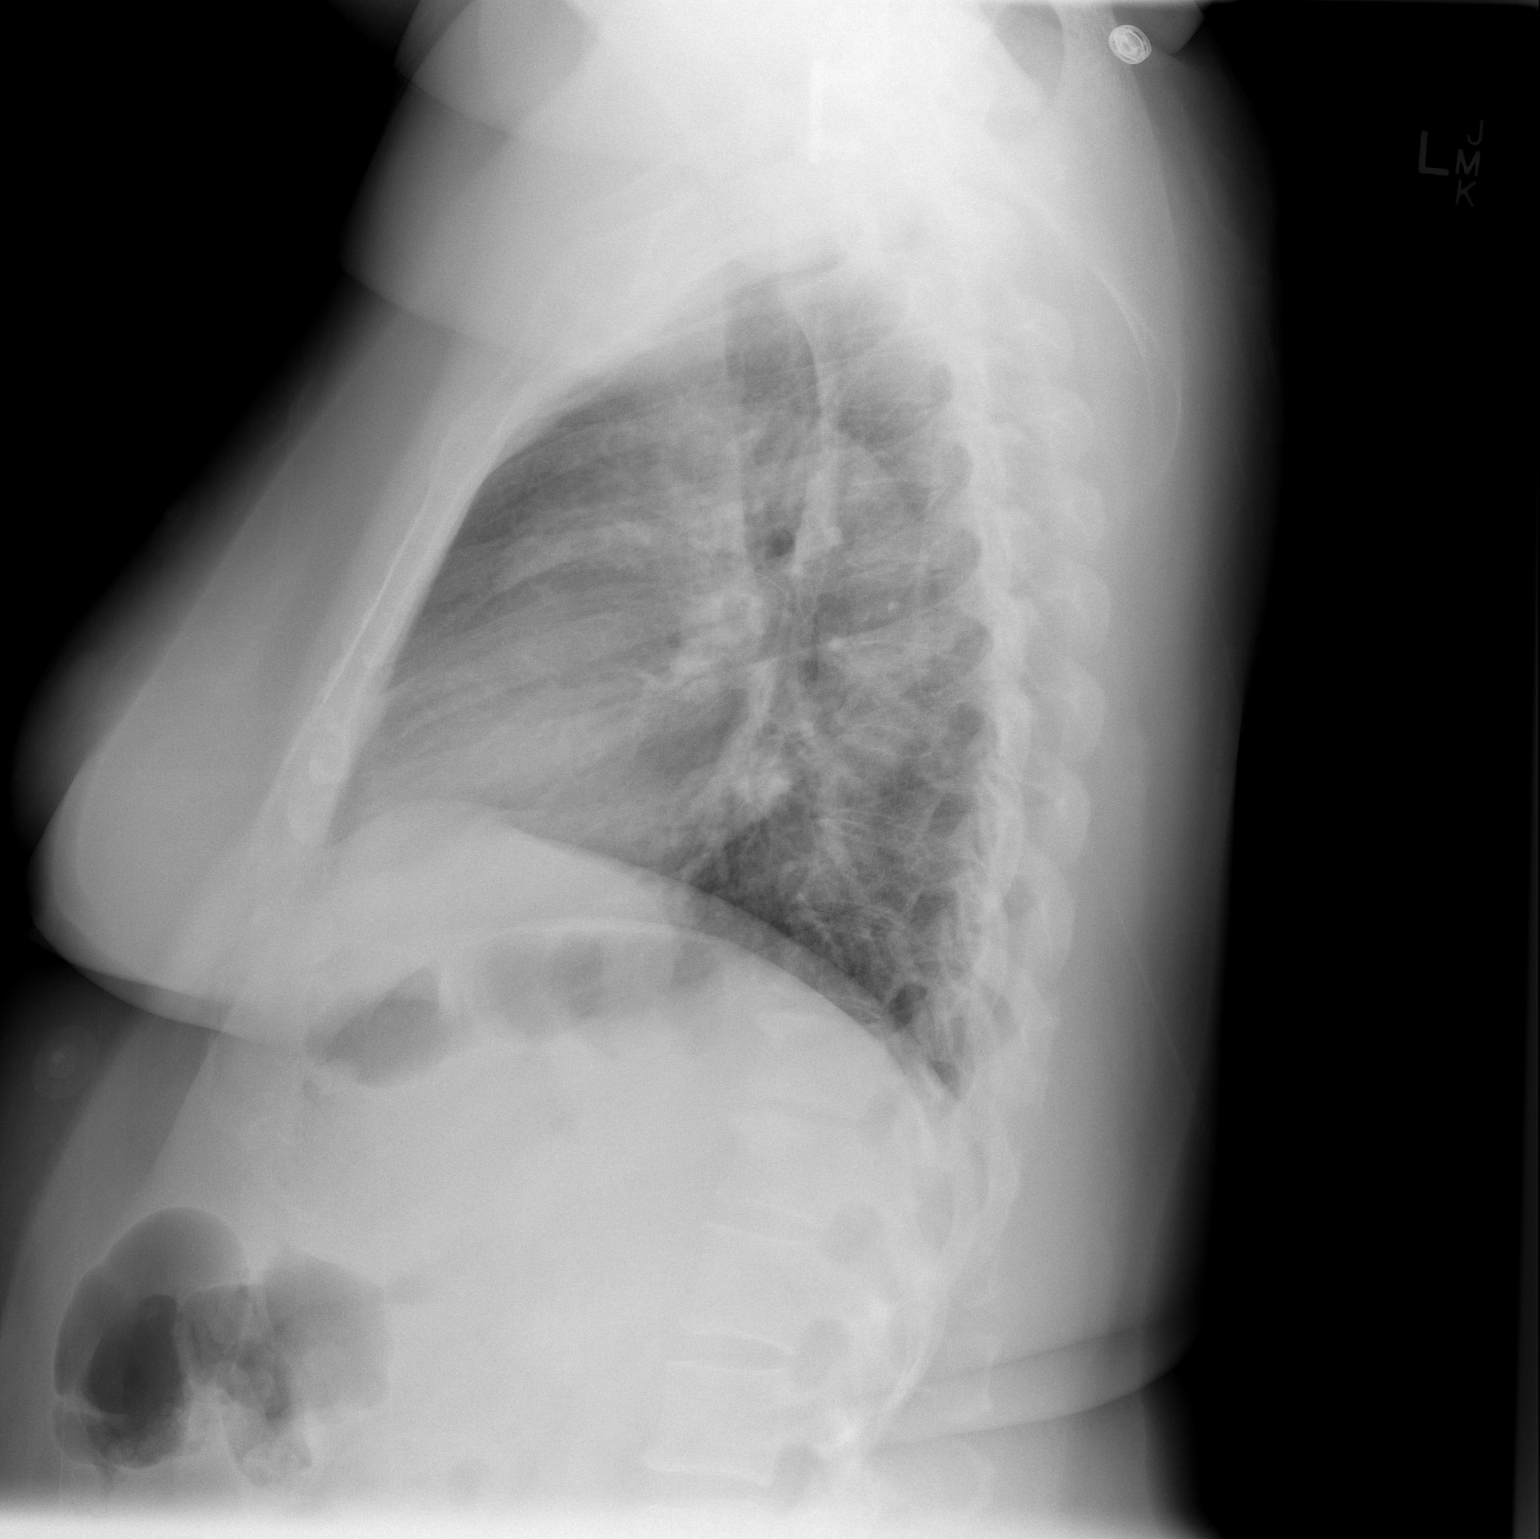

[2 of 2 positions shown; findings below may reference images not displayed]

FINDINGS: Heart is borderline in size.  There are low lung volumes
without focal airspace opacity, effusion, or edema.  No acute bony
abnormality.
IMPRESSION: Low lung volumes.  No acute findings.

## 2011-01-07 IMAGING — CT CT ANGIO CHEST
2 of 6 series · 19 of 36 positions shown · IV contrast (APPLIED)
Comparison: 05/01/2006

CLINICAL DATA: Chest pain, shortness of breath, cough.

CT ANGIOGRAPHY CHEST WITH CONTRAST
TECHNIQUE: Multidetector CT imaging of the chest was performed
using the standard protocol during bolus administration of
intravenous contrast. Multiplanar CT image reconstructions
including MIPs were obtained to evaluate the vascular anatomy.
Contrast: 100 ml [id number]

[Series 10: pulm embolism 1.0 b25f thins · axial · 0.65mm/px · z∈[+1060,+1266]mm · 17 of 230 slices shown]
[im 12/230  lung]
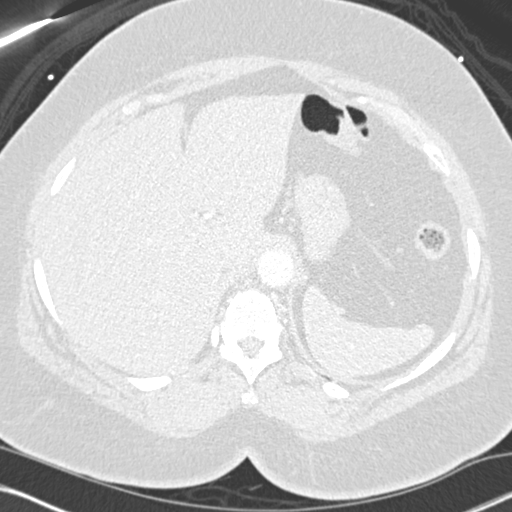
[im 23/230  mediastinal]
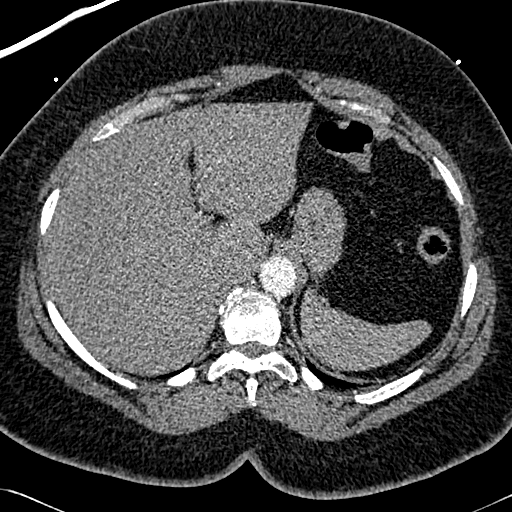
[im 35/230  lung]
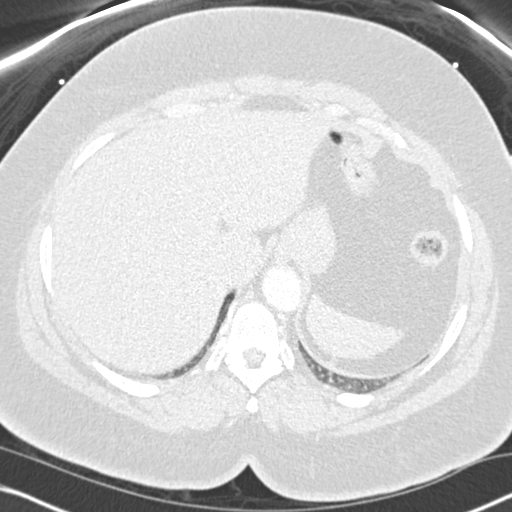
[im 46/230  mediastinal]
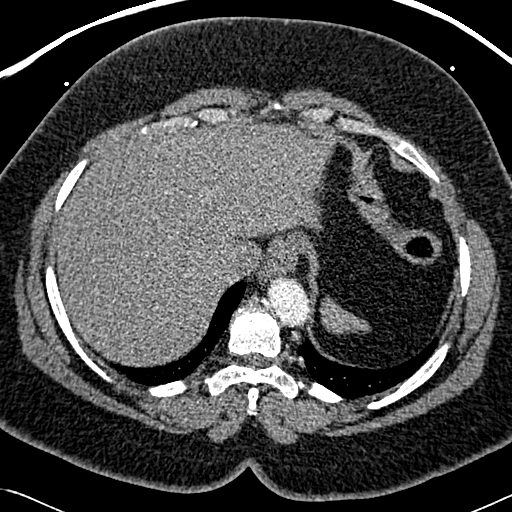
[im 69/230  lung]
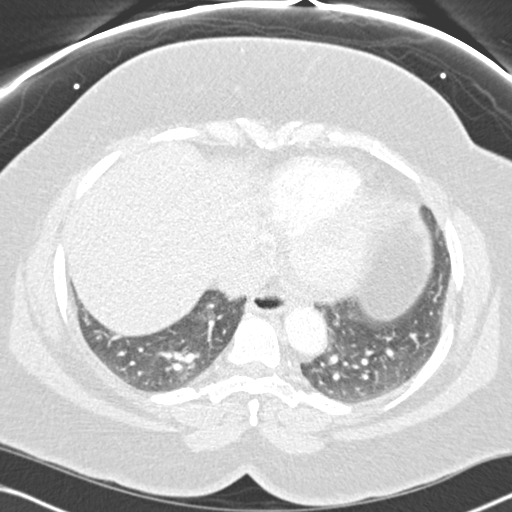
[im 81/230  mediastinal]
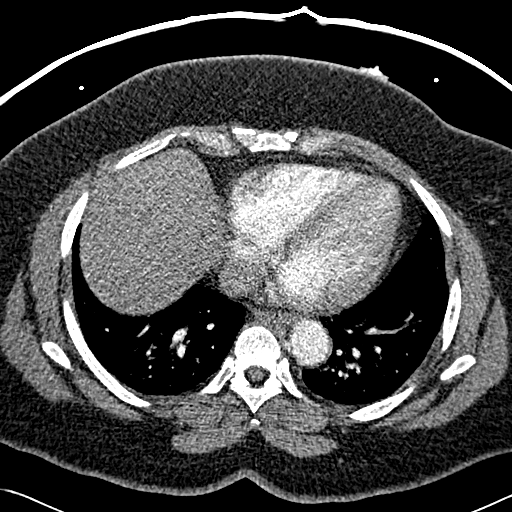
[im 92/230  lung]
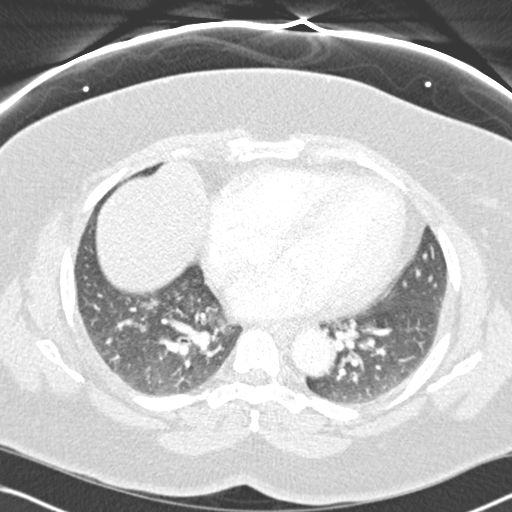
[im 104/230  mediastinal]
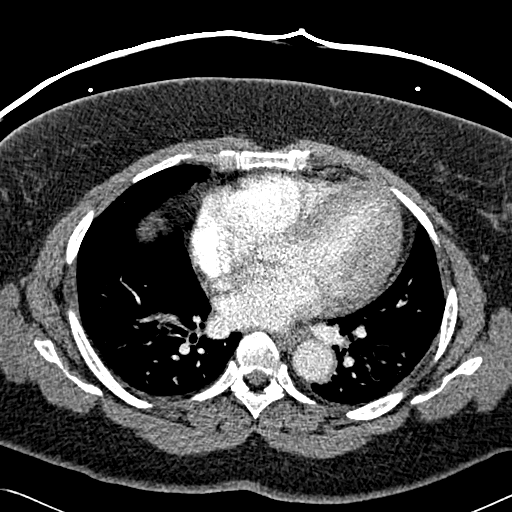
[im 115/230  lung]
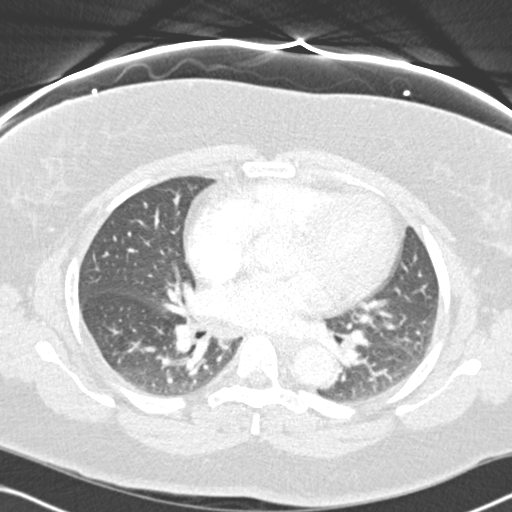
[im 126/230  mediastinal]
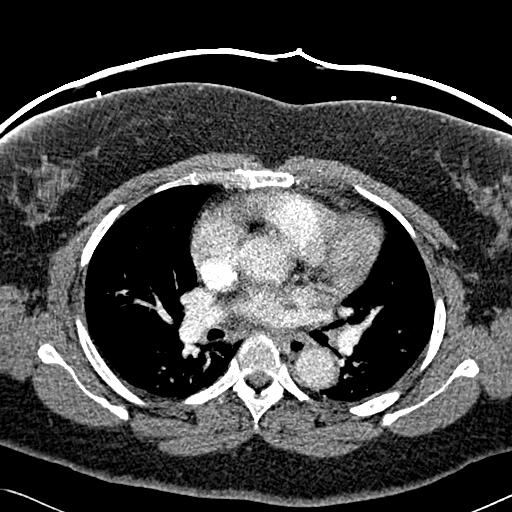
[im 138/230  lung]
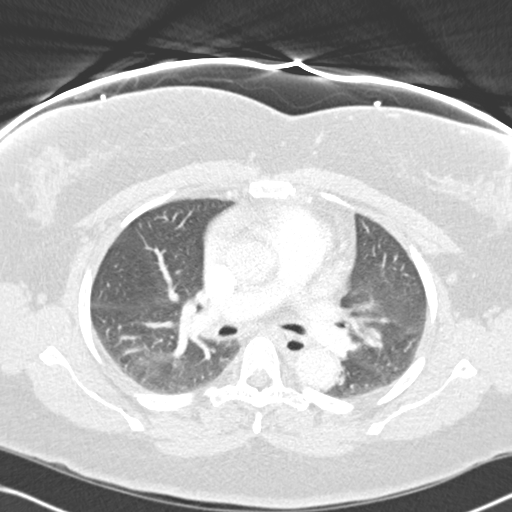
[im 149/230  mediastinal]
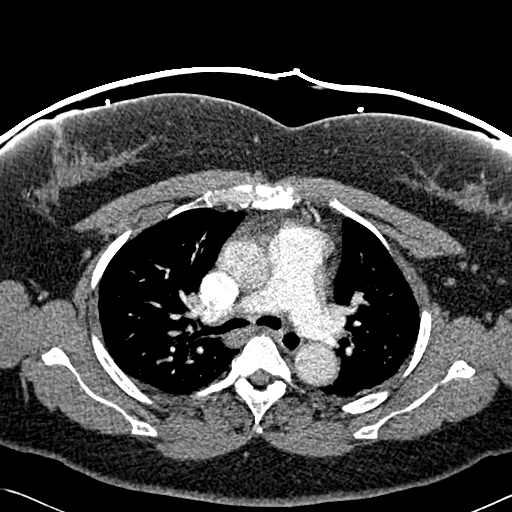
[im 161/230  lung]
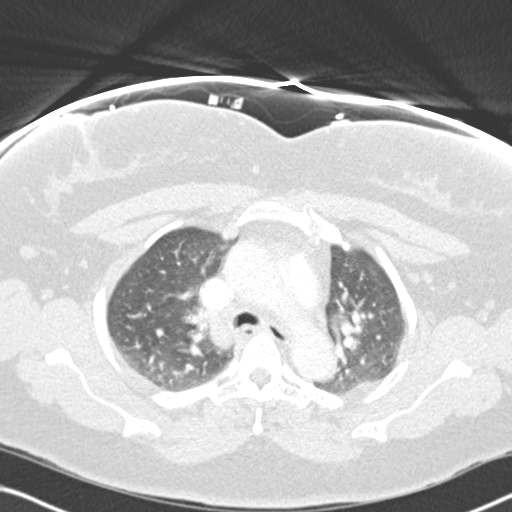
[im 184/230  mediastinal]
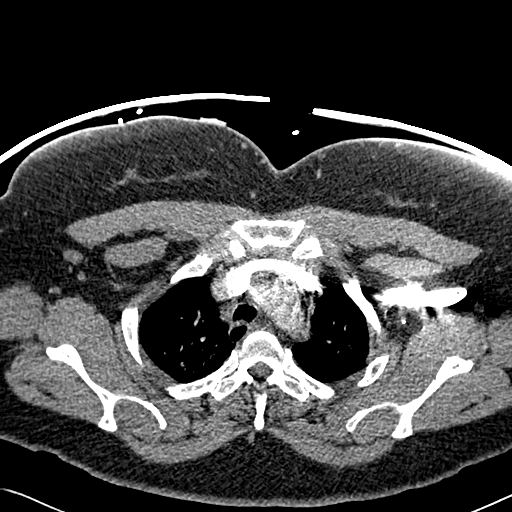
[im 195/230  lung]
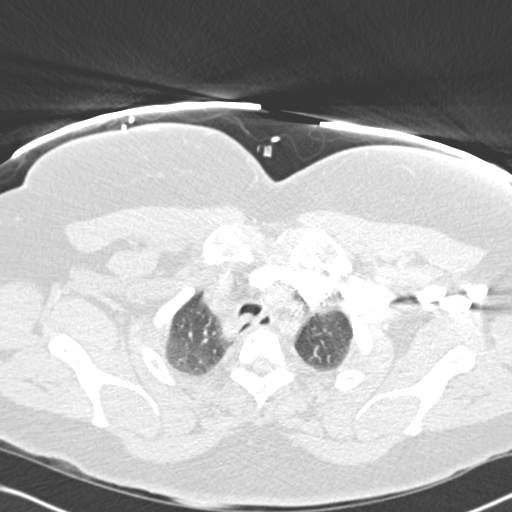
[im 207/230  mediastinal]
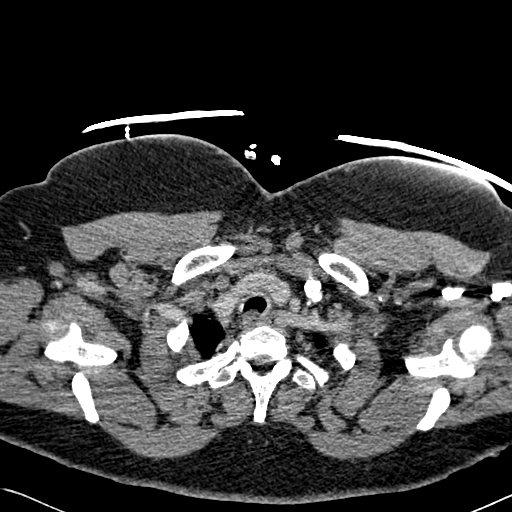
[im 218/230  lung]
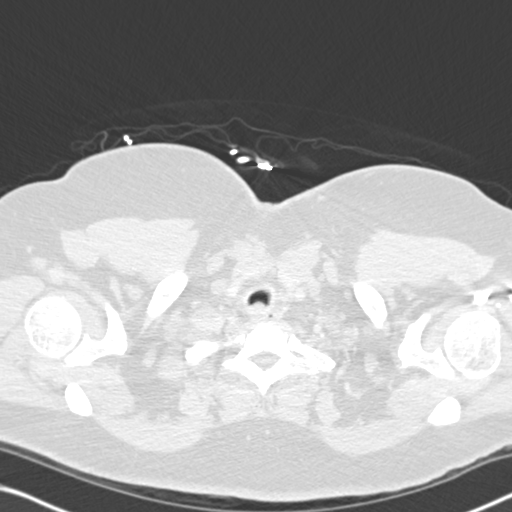

[Series 604: coronal mips · coronal · 0.65mm/px · 2 of 108 slices shown]
[im 36/108  mediastinal]
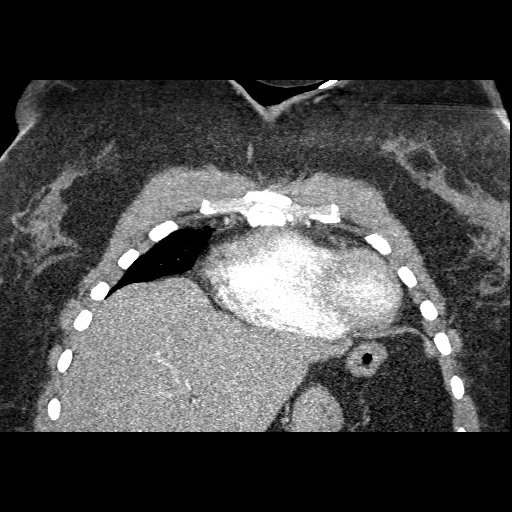
[im 72/108  mediastinal]
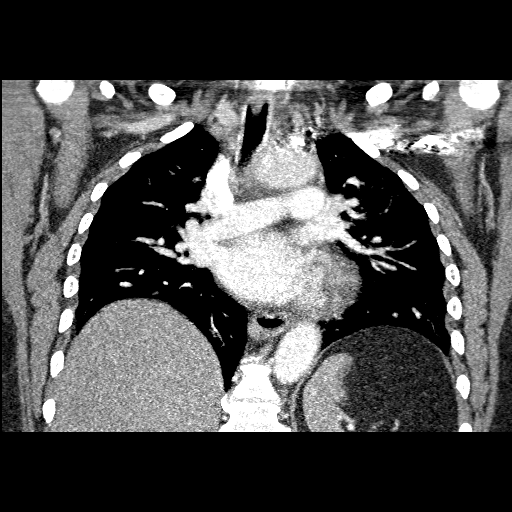

[19 of 36 positions shown; findings below may reference images not displayed]

FINDINGS: Pulmonary arterial opacification is suboptimal.  No
large or central pulmonary emboli visualized.  There is mild
cardiomegaly.  Aorta is normal caliber.  No mediastinal, hilar, or
axillary adenopathy.  Small bilateral axillary nodes present.

There is mild vascular congestion.  No focal airspace opacities.
No effusions.

Imaging into the upper abdomen shows no acute findings.

Review of the MIP images confirms the above findings.
IMPRESSION: Suboptimal pulmonary arterial opacification.  No large or central
pulmonary embolus.

Cardiomegaly, vascular congestion.

## 2011-01-09 ENCOUNTER — Encounter: Payer: Medicare Other | Admitting: Rehabilitation

## 2011-01-11 ENCOUNTER — Other Ambulatory Visit (HOSPITAL_COMMUNITY): Payer: Self-pay | Admitting: Orthopedic Surgery

## 2011-01-11 ENCOUNTER — Ambulatory Visit: Payer: Medicare Other | Admitting: Physical Therapy

## 2011-01-11 DIAGNOSIS — I82409 Acute embolism and thrombosis of unspecified deep veins of unspecified lower extremity: Secondary | ICD-10-CM

## 2011-01-13 ENCOUNTER — Ambulatory Visit: Payer: Medicare Other | Admitting: Rehabilitation

## 2011-01-17 ENCOUNTER — Other Ambulatory Visit: Payer: Self-pay | Admitting: Oncology

## 2011-01-17 ENCOUNTER — Encounter (HOSPITAL_BASED_OUTPATIENT_CLINIC_OR_DEPARTMENT_OTHER): Payer: Medicare Other | Admitting: Oncology

## 2011-01-17 DIAGNOSIS — D6859 Other primary thrombophilia: Secondary | ICD-10-CM

## 2011-01-17 DIAGNOSIS — Z7901 Long term (current) use of anticoagulants: Secondary | ICD-10-CM

## 2011-01-17 DIAGNOSIS — Z86718 Personal history of other venous thrombosis and embolism: Secondary | ICD-10-CM

## 2011-01-17 LAB — PROTIME-INR
INR: 2.5 (ref 2.00–3.50)
Protime: 30 Seconds — ABNORMAL HIGH (ref 10.6–13.4)

## 2011-01-18 ENCOUNTER — Ambulatory Visit: Payer: Medicare Other | Admitting: Physical Therapy

## 2011-01-19 ENCOUNTER — Ambulatory Visit: Payer: Medicare Other | Attending: Orthopedic Surgery | Admitting: Physical Therapy

## 2011-01-19 DIAGNOSIS — M25569 Pain in unspecified knee: Secondary | ICD-10-CM | POA: Insufficient documentation

## 2011-01-19 DIAGNOSIS — Z96659 Presence of unspecified artificial knee joint: Secondary | ICD-10-CM | POA: Insufficient documentation

## 2011-01-19 DIAGNOSIS — IMO0001 Reserved for inherently not codable concepts without codable children: Secondary | ICD-10-CM | POA: Insufficient documentation

## 2011-01-19 DIAGNOSIS — R262 Difficulty in walking, not elsewhere classified: Secondary | ICD-10-CM | POA: Insufficient documentation

## 2011-01-19 DIAGNOSIS — M6281 Muscle weakness (generalized): Secondary | ICD-10-CM | POA: Insufficient documentation

## 2011-01-20 ENCOUNTER — Encounter: Payer: Medicare Other | Admitting: Physical Therapy

## 2011-01-21 ENCOUNTER — Encounter: Payer: Self-pay | Admitting: *Deleted

## 2011-01-21 ENCOUNTER — Telehealth: Payer: Self-pay | Admitting: Oncology

## 2011-01-21 NOTE — Telephone Encounter (Signed)
Tried to contact the pt via the phone number listed on the demographics page the number has been disconnected. Mailed the pt her dec,jan 2013 appts.

## 2011-01-24 ENCOUNTER — Ambulatory Visit: Payer: Self-pay | Admitting: Hematology and Oncology

## 2011-01-24 ENCOUNTER — Other Ambulatory Visit: Payer: Self-pay | Admitting: *Deleted

## 2011-01-24 ENCOUNTER — Telehealth: Payer: Self-pay | Admitting: *Deleted

## 2011-01-24 NOTE — Telephone Encounter (Signed)
Patient left VM that her knee hurts today and can't make her rescheduled appointment. Wants to reschedule. Will order for reschedule of lab/MD visit in [facility] has lab for 12/3

## 2011-01-25 ENCOUNTER — Encounter: Payer: Medicare Other | Admitting: Physical Therapy

## 2011-01-27 ENCOUNTER — Encounter: Payer: Self-pay | Admitting: Physical Therapy

## 2011-01-30 ENCOUNTER — Ambulatory Visit: Payer: Medicare Other | Admitting: Physical Therapy

## 2011-02-01 ENCOUNTER — Ambulatory Visit: Payer: Medicare Other | Admitting: Physical Therapy

## 2011-02-03 ENCOUNTER — Ambulatory Visit (HOSPITAL_COMMUNITY)
Admission: RE | Admit: 2011-02-03 | Discharge: 2011-02-03 | Disposition: A | Discharge status: Home / Self Care | Payer: Medicare Other | Source: Ambulatory Visit | Attending: Orthopedic Surgery | Admitting: Orthopedic Surgery

## 2011-02-03 ENCOUNTER — Inpatient Hospital Stay (HOSPITAL_COMMUNITY): Admission: RE | Admit: 2011-02-03 | Payer: Self-pay | Source: Ambulatory Visit

## 2011-02-03 DIAGNOSIS — I82409 Acute embolism and thrombosis of unspecified deep veins of unspecified lower extremity: Secondary | ICD-10-CM

## 2011-02-07 ENCOUNTER — Telehealth: Payer: Self-pay | Admitting: Oncology

## 2011-02-07 ENCOUNTER — Other Ambulatory Visit: Payer: Self-pay | Admitting: Physician Assistant

## 2011-02-07 MED ORDER — SODIUM CHLORIDE 0.9 % IV SOLN: INTRAVENOUS | Status: DC

## 2011-02-07 NOTE — Telephone Encounter (Signed)
S/w the pt and she is aware of her appt in dec and will pick up her jan 2013 schedule at that time

## 2011-02-08 ENCOUNTER — Ambulatory Visit (HOSPITAL_COMMUNITY): Admission: RE | Admit: 2011-02-08 | Payer: Medicare Other | Source: Ambulatory Visit

## 2011-02-13 ENCOUNTER — Ambulatory Visit: Payer: Medicare Other | Admitting: Physical Therapy

## 2011-02-15 ENCOUNTER — Ambulatory Visit: Payer: Medicare Other | Admitting: Physical Therapy

## 2011-02-17 ENCOUNTER — Telehealth: Payer: Self-pay | Admitting: *Deleted

## 2011-02-20 ENCOUNTER — Ambulatory Visit: Payer: Medicare Other | Attending: Orthopedic Surgery | Admitting: Rehabilitation

## 2011-02-20 ENCOUNTER — Other Ambulatory Visit: Payer: Self-pay | Admitting: Lab

## 2011-02-20 DIAGNOSIS — R262 Difficulty in walking, not elsewhere classified: Secondary | ICD-10-CM | POA: Insufficient documentation

## 2011-02-20 DIAGNOSIS — IMO0001 Reserved for inherently not codable concepts without codable children: Secondary | ICD-10-CM | POA: Insufficient documentation

## 2011-02-20 DIAGNOSIS — M6281 Muscle weakness (generalized): Secondary | ICD-10-CM | POA: Insufficient documentation

## 2011-02-20 DIAGNOSIS — Z96659 Presence of unspecified artificial knee joint: Secondary | ICD-10-CM | POA: Insufficient documentation

## 2011-02-20 DIAGNOSIS — M25569 Pain in unspecified knee: Secondary | ICD-10-CM | POA: Insufficient documentation

## 2011-02-22 ENCOUNTER — Ambulatory Visit: Payer: Medicare Other | Admitting: Physical Therapy

## 2011-02-24 ENCOUNTER — Other Ambulatory Visit: Payer: Self-pay | Admitting: Lab

## 2011-02-27 ENCOUNTER — Ambulatory Visit: Payer: Medicare Other | Admitting: Rehabilitation

## 2011-02-28 ENCOUNTER — Other Ambulatory Visit: Payer: Self-pay | Admitting: Radiology

## 2011-03-01 ENCOUNTER — Other Ambulatory Visit (HOSPITAL_COMMUNITY): Payer: Self-pay | Admitting: Orthopedic Surgery

## 2011-03-01 ENCOUNTER — Ambulatory Visit (HOSPITAL_COMMUNITY)
Admission: RE | Admit: 2011-03-01 | Discharge: 2011-03-01 | Disposition: A | Discharge status: Home / Self Care | Payer: Medicare Other | Source: Ambulatory Visit | Attending: Orthopedic Surgery | Admitting: Orthopedic Surgery

## 2011-03-01 ENCOUNTER — Ambulatory Visit: Payer: Medicare Other | Admitting: Physical Therapy

## 2011-03-01 DIAGNOSIS — J449 Chronic obstructive pulmonary disease, unspecified: Secondary | ICD-10-CM | POA: Insufficient documentation

## 2011-03-01 DIAGNOSIS — Z96659 Presence of unspecified artificial knee joint: Secondary | ICD-10-CM | POA: Insufficient documentation

## 2011-03-01 DIAGNOSIS — Z86718 Personal history of other venous thrombosis and embolism: Secondary | ICD-10-CM | POA: Insufficient documentation

## 2011-03-01 DIAGNOSIS — I10 Essential (primary) hypertension: Secondary | ICD-10-CM | POA: Insufficient documentation

## 2011-03-01 DIAGNOSIS — E119 Type 2 diabetes mellitus without complications: Secondary | ICD-10-CM | POA: Insufficient documentation

## 2011-03-01 DIAGNOSIS — I82409 Acute embolism and thrombosis of unspecified deep veins of unspecified lower extremity: Secondary | ICD-10-CM

## 2011-03-01 DIAGNOSIS — J4489 Other specified chronic obstructive pulmonary disease: Secondary | ICD-10-CM | POA: Insufficient documentation

## 2011-03-01 DIAGNOSIS — Z4689 Encounter for fitting and adjustment of other specified devices: Secondary | ICD-10-CM | POA: Insufficient documentation

## 2011-03-01 LAB — BASIC METABOLIC PANEL
BUN: 11 mg/dL (ref 6–23)
CO2: 28 mEq/L (ref 19–32)
Calcium: 9 mg/dL (ref 8.4–10.5)
Chloride: 100 mEq/L (ref 96–112)
Creatinine, Ser: 0.59 mg/dL (ref 0.50–1.10)
GFR calc Af Amer: >90 mL/min (ref >90)
GFR calc non Af Amer: >90 mL/min (ref >90)
Glucose, Bld: 127 mg/dL — ABNORMAL HIGH (ref 70–99)
Potassium: 3.1 mEq/L — ABNORMAL LOW (ref 3.5–5.1)
Sodium: 140 mEq/L (ref 135–145)

## 2011-03-01 LAB — APTT: aPTT: 33 seconds (ref 24–37)

## 2011-03-01 LAB — CBC
HCT: 34.3 % — ABNORMAL LOW (ref 36.0–46.0)
Hemoglobin: 11.1 g/dL — ABNORMAL LOW (ref 12.0–15.0)
MCH: 25.8 pg — ABNORMAL LOW (ref 26.0–34.0)
MCHC: 32.4 g/dL (ref 30.0–36.0)
MCV: 79.6 fL (ref 78.0–100.0)
Platelets: 397 10*3/uL (ref 150–400)
RBC: 4.31 MIL/uL (ref 3.87–5.11)
RDW: 16.8 % — ABNORMAL HIGH (ref 11.5–15.5)
WBC: 4.9 10*3/uL (ref 4.0–10.5)

## 2011-03-01 LAB — PROTIME-INR
INR: 1.02 (ref 0.00–1.49)
Prothrombin Time: 13.6 seconds (ref 11.6–15.2)

## 2011-03-01 LAB — GLUCOSE, CAPILLARY: Glucose-Capillary: 100 mg/dL — ABNORMAL HIGH (ref 70–99)

## 2011-03-01 MED ORDER — IOHEXOL 300 MG/ML  SOLN
100.0000 mL | Freq: Once | INTRAMUSCULAR | Status: AC | PRN
  Administered 2011-03-01: 60 mL via INTRAVENOUS

## 2011-03-01 MED ORDER — FENTANYL CITRATE 0.05 MG/ML IJ SOLN
INTRAMUSCULAR | Status: AC | PRN
  Administered 2011-03-01: 50 ug via INTRAVENOUS

## 2011-03-01 MED ORDER — MIDAZOLAM HCL 5 MG/5ML IJ SOLN
INTRAMUSCULAR | Status: AC | PRN
  Administered 2011-03-01: 1 mg via INTRAVENOUS

## 2011-03-01 MED ORDER — MIDAZOLAM HCL 2 MG/2ML IJ SOLN
INTRAMUSCULAR | Status: AC
  Filled 2011-03-01: qty 4

## 2011-03-01 MED ORDER — SODIUM CHLORIDE 0.9 % IV SOLN
Freq: Once | INTRAVENOUS | Status: AC
  Administered 2011-03-01: 08:00:00 via INTRAVENOUS

## 2011-03-01 MED ORDER — POTASSIUM CHLORIDE CRYS ER 20 MEQ PO TBCR
40.0000 meq | EXTENDED_RELEASE_TABLET | Freq: Once | ORAL | Status: AC
  Administered 2011-03-01: 40 meq via ORAL

## 2011-03-01 MED ORDER — FENTANYL CITRATE 0.05 MG/ML IJ SOLN
INTRAMUSCULAR | Status: AC
  Filled 2011-03-01: qty 4

## 2011-03-01 NOTE — ED Notes (Signed)
O2 discontinued.

## 2011-03-01 NOTE — ED Notes (Signed)
Transferred to Short stay with self. Dressing to right upper chest/neck clean dry and intact. Area soft and non bruised. Melanie RN present during dressing check. No pain. Awake and oriented.

## 2011-03-01 NOTE — Progress Notes (Signed)
K+ result reported to [facility], [facility], no new order received; he stated may give KCL post-procedure.

## 2011-03-01 NOTE — Procedures (Signed)
Successful IVC filter retrieval No comp Stable

## 2011-03-01 NOTE — H&P (Signed)
[name] is an 55 y.o. female.   Chief Complaint: Here for IVC filter retrieval. HPI:  Pleasant 55 yo female with long history of "blood clots" followed by Dr. Truett Franklin for coagulopathy. Patient underwent bilateral knee surgery in August of this year. Coumadin therapy stopped for the surgery and IVC filter placed. Pt now back on coumadin although coumadin has been on hold since last Wednesday. Scheduled for IVC filter retrieval today.   Past Medical History  Diagnosis Date  . DVT (deep venous thrombosis)     Recurrent  . Hypertension   . Diabetes mellitus   . Adrenal mass 03/226    Benign  . COPD (chronic obstructive pulmonary disease)   . Lupus Anticoagulant Positive   . Clotting disorder     +beta-2-glycoprotein IgA antibody  . History of cocaine abuse     Remote history   . Arthritis     knees/multiple orthopedic conditons    Past Surgical History  Procedure Date  . Carpal tunnel release     Bilateral  . Abdominal hysterectomy 1989    Fibroids  . Back surgery     cervical spine---disk disease  . Cesarean section   . Replacement total knee 11/17/2008    Left knee    No family history on file. Social History:  does not have a smoking history on file. She does not have any smokeless tobacco history on file. Her alcohol and drug histories not on file.  Allergies: No Known Allergies  Medications Prior to Admission  Medication Sig Dispense Refill  . Albuterol (VENTOLIN IN) Inhale 2 puffs into the lungs as needed.        Marland Kitchen amLODipine (NORVASC) 5 MG tablet Take 5 mg by mouth daily.        . cloNIDine (CATAPRES) 0.1 MG tablet Take 0.1 mg by mouth 2 (two) times daily.        . diclofenac (VOLTAREN) 75 MG EC tablet Take 75 mg by mouth 2 (two) times daily as needed.        . diltiazem (CARDIZEM CD) 180 MG 24 hr capsule Take 180 mg by mouth daily.        . DULoxetine (CYMBALTA) 60 MG capsule Take 60 mg by mouth daily.        Marland Kitchen gabapentin (NEURONTIN) 300 MG capsule Take 300  mg by mouth 3 (three) times daily.        . metFORMIN (GLUCOPHAGE) 500 MG tablet Take 500 mg by mouth 2 (two) times daily with a meal. Checks CBG every MWF       . polyethylene glycol (MIRALAX / GLYCOLAX) packet Take 17 g by mouth daily.        . QUEtiapine (SEROQUEL) 50 MG tablet Take 50 mg by mouth 2 (two) times daily.        . simvastatin (ZOCOR) 20 MG tablet Take 20 mg by mouth at bedtime.        Marland Kitchen warfarin (COUMADIN) 5 MG tablet Take 10 mg by mouth daily.        . budesonide-formoterol (SYMBICORT) 80-4.5 MCG/ACT inhaler Inhale 2 puffs into the lungs 2 (two) times daily.        Marland Kitchen loratadine (CLARITIN) 10 MG tablet Take 10 mg by mouth daily.         Medications Prior to Admission  Medication Dose Route Frequency Provider Last Rate Last Dose  . 0.9 %  sodium chloride infusion   Intravenous Once Robet Leu, PA 20  mL/hr at 03/01/11 0756      No results found for this or any previous visit (from the past 48 hour(s)). No results found.  Review of Systems  Constitutional: Negative for fever, chills and weight loss.  Respiratory: Positive for cough and wheezing. Negative for shortness of breath.   Cardiovascular: Positive for palpitations. Negative for chest pain.  Genitourinary: Negative for dysuria and hematuria.  Neurological: Negative for headaches.  Endo/Heme/Allergies: Does not bruise/bleed easily.    Blood pressure 129/85, pulse 83, temperature 98.1 F (36.7 C), temperature source Oral, resp. rate 18, height 5\' 6"  (1.676 m), weight 260 lb (117.935 kg), SpO2 97.00%. Physical Exam  Heent - unremarkable - airway - 2 Heart - RRR LUNGS - Clear Abd - non tender - soft  Assessment/Plan IVC filter retrieval today Dr [name]. Informed consent obtained.  [name] 03/01/2011, 8:38 AM

## 2011-03-07 ENCOUNTER — Encounter: Payer: Medicare Other | Admitting: Physical Therapy

## 2011-03-08 ENCOUNTER — Ambulatory Visit: Payer: Medicare Other | Admitting: Physical Therapy

## 2011-03-09 ENCOUNTER — Ambulatory Visit: Payer: Medicare Other | Admitting: Physical Therapy

## 2011-03-16 ENCOUNTER — Ambulatory Visit: Payer: Medicare Other | Admitting: Physical Therapy

## 2011-03-23 ENCOUNTER — Ambulatory Visit: Payer: Medicare Other | Attending: Orthopedic Surgery | Admitting: Rehabilitation

## 2011-03-23 DIAGNOSIS — Z96659 Presence of unspecified artificial knee joint: Secondary | ICD-10-CM | POA: Insufficient documentation

## 2011-03-23 DIAGNOSIS — IMO0001 Reserved for inherently not codable concepts without codable children: Secondary | ICD-10-CM | POA: Insufficient documentation

## 2011-03-23 DIAGNOSIS — R262 Difficulty in walking, not elsewhere classified: Secondary | ICD-10-CM | POA: Insufficient documentation

## 2011-03-23 DIAGNOSIS — M6281 Muscle weakness (generalized): Secondary | ICD-10-CM | POA: Insufficient documentation

## 2011-03-23 DIAGNOSIS — M25569 Pain in unspecified knee: Secondary | ICD-10-CM | POA: Insufficient documentation

## 2011-03-24 ENCOUNTER — Encounter: Payer: Self-pay | Admitting: *Deleted

## 2011-03-24 ENCOUNTER — Other Ambulatory Visit: Payer: Self-pay | Admitting: *Deleted

## 2011-03-24 DIAGNOSIS — I8000 Phlebitis and thrombophlebitis of superficial vessels of unspecified lower extremity: Secondary | ICD-10-CM

## 2011-03-27 ENCOUNTER — Telehealth: Payer: Self-pay | Admitting: Oncology

## 2011-03-27 ENCOUNTER — Other Ambulatory Visit (HOSPITAL_BASED_OUTPATIENT_CLINIC_OR_DEPARTMENT_OTHER): Payer: Medicare Other | Admitting: Lab

## 2011-03-27 ENCOUNTER — Ambulatory Visit (HOSPITAL_BASED_OUTPATIENT_CLINIC_OR_DEPARTMENT_OTHER): Payer: Medicare Other | Admitting: Nurse Practitioner

## 2011-03-27 VITALS — BP 142/92 | HR 91 | Temp 97.3°F | Ht 66.0 in | Wt 268.0 lb

## 2011-03-27 DIAGNOSIS — I82409 Acute embolism and thrombosis of unspecified deep veins of unspecified lower extremity: Secondary | ICD-10-CM

## 2011-03-27 DIAGNOSIS — I8000 Phlebitis and thrombophlebitis of superficial vessels of unspecified lower extremity: Secondary | ICD-10-CM

## 2011-03-27 DIAGNOSIS — Z5181 Encounter for therapeutic drug level monitoring: Secondary | ICD-10-CM

## 2011-03-27 DIAGNOSIS — Z7901 Long term (current) use of anticoagulants: Secondary | ICD-10-CM

## 2011-03-27 LAB — PROTIME-INR
INR: 2.5 (ref 2.00–3.50)
Protime: 30 Seconds — ABNORMAL HIGH (ref 10.6–13.4)

## 2011-03-27 NOTE — Telephone Encounter (Signed)
gve the pt her feb-June 2013 appt calendar

## 2011-03-27 NOTE — Progress Notes (Signed)
OFFICE PROGRESS NOTE  Interval history:  [name] returns as scheduled. She overall feels well. She reports undergoing a right total knee replacement in September 2012. She recently completed physical therapy.   Current Coumadin dose is 10 mg daily. She denies bleeding. No shortness of breath or chest pain. She denies leg swelling or calf pain.   Objective: Blood pressure 142/92, pulse 91, temperature 97.3 F (36.3 C), temperature source Oral, height 5\' 6"  (1.676 m), weight 268 lb (121.564 kg).  Oropharynx is without thrush or ulceration. Lungs are clear. No wheezes or rales. Regular cardiac rhythm. Abdomen is soft and nontender. No organomegaly. Extremities are without edema. Calves are soft and nontender.   Lab Results: Lab Results  Component Value Date   WBC 4.9 03/01/2011   HGB 11.1* 03/01/2011   HCT 34.3* 03/01/2011   MCV 79.6 03/01/2011   PLT 397 03/01/2011    Chemistry:    Chemistry      Component Value Date/Time   NA 140 03/01/2011 0718   K 3.1* 03/01/2011 0718   CL 100 03/01/2011 0718   CO2 28 03/01/2011 0718   BUN 11 03/01/2011 0718   CREATININE 0.59 03/01/2011 0718   GLU 114* 01/20/2008 1215      Component Value Date/Time   CALCIUM 9.0 03/01/2011 0718   ALKPHOS 111 06/10/2010 1234   AST 40* 06/10/2010 1234   ALT 49* 06/10/2010 1234   BILITOT 0.5 06/10/2010 1234       Studies/Results: Ir US Guide Vasc Access Right  03/01/2011  *RADIOLOGY REPORT*  Clinical Data: IVC filter placed preoperatively before knee replacement.  The patient was taken off anticoagulation for the surgery and therefore the filter was necessary for history of thromboembolic disease.  The patient is now postop and ambulatory. Filter can be removed.  ULTRASOUND GUIDANCE FOR VASCULAR ACCESS.  IVC CATHETERIZATION AND VENOGRAM  IVC FILTER RETRIEVAL  Date:  03/01/2011 09:30:00  Radiologist:  M. Ruel Favors, M.D.  Medications:  1 mg Versed, 50 mcg Fentanyl  Guidance:  Ultrasound fluoroscopic   Fluoroscopy time:  4.5 minutes  Sedation time:  15 minutes  Contrast volume:  60 ml Omnipaque-300  Complications:  No immediate  PROCEDURE/FINDINGS:  Informed consent was obtained from the patient following explanation of the procedure, risks, benefits and alternatives. The patient understands, agrees and consents for the procedure. All questions were addressed.  A time out was performed.  Maximal barrier sterile technique utilized including caps, mask, sterile gowns, sterile gloves, large sterile drape, hand hygiene, and betadine  Under sterile conditions and local anesthesia, right IJ micropuncture venous access was performed.  This was done with ultrasound.  Images obtained for documentation.  Guide wire advanced followed by the 4-French dilator set.  This allowed advancement of the Bentson guide wire into the IVC below the filter.  Pigtail catheter advanced below the filter.  IVC venogram performed.  IVC venogram:  IVC is patent.  Filter is positioned below the renal veins.  No evidence of thrombus, IVC occlusion or narrowing.  IVC filter retrieval:  Guide wire was advanced.  Pigtail was removed and exchanged for the cook Celect retrieval sheath.  Sheath was positioned above the filter.  The snare was utilized to capture the filter apex.  Filter was collapsed within the sheath and removed entirely.  No immediate complication.  Post removal, IVC venogram repeated demonstrating no acute abnormality or complication.  The patient tolerated the procedure well.  No immediate complication.  IMPRESSION: Successful infrarenal IVC filter retrieval.  Original Report Authenticated By: Judie Petit. Ruel Favors, M.D.   Ir Ivc Filter Retrieval / S&i /img Guid/mod Sed  03/01/2011  *RADIOLOGY REPORT*  Clinical Data: IVC filter placed preoperatively before knee replacement.  The patient was taken off anticoagulation for the surgery and therefore the filter was necessary for history of thromboembolic disease.  The patient is now postop  and ambulatory. Filter can be removed.  ULTRASOUND GUIDANCE FOR VASCULAR ACCESS.  IVC CATHETERIZATION AND VENOGRAM  IVC FILTER RETRIEVAL  Date:  03/01/2011 09:30:00  Radiologist:  M. Ruel Favors, M.D.  Medications:  1 mg Versed, 50 mcg Fentanyl  Guidance:  Ultrasound fluoroscopic  Fluoroscopy time:  4.5 minutes  Sedation time:  15 minutes  Contrast volume:  60 ml Omnipaque-300  Complications:  No immediate  PROCEDURE/FINDINGS:  Informed consent was obtained from the patient following explanation of the procedure, risks, benefits and alternatives. The patient understands, agrees and consents for the procedure. All questions were addressed.  A time out was performed.  Maximal barrier sterile technique utilized including caps, mask, sterile gowns, sterile gloves, large sterile drape, hand hygiene, and betadine  Under sterile conditions and local anesthesia, right IJ micropuncture venous access was performed.  This was done with ultrasound.  Images obtained for documentation.  Guide wire advanced followed by the 4-French dilator set.  This allowed advancement of the Bentson guide wire into the IVC below the filter.  Pigtail catheter advanced below the filter.  IVC venogram performed.  IVC venogram:  IVC is patent.  Filter is positioned below the renal veins.  No evidence of thrombus, IVC occlusion or narrowing.  IVC filter retrieval:  Guide wire was advanced.  Pigtail was removed and exchanged for the cook Celect retrieval sheath.  Sheath was positioned above the filter.  The snare was utilized to capture the filter apex.  Filter was collapsed within the sheath and removed entirely.  No immediate complication.  Post removal, IVC venogram repeated demonstrating no acute abnormality or complication.  The patient tolerated the procedure well.  No immediate complication.  IMPRESSION: Successful infrarenal IVC filter retrieval.  Original Report Authenticated By: Judie Petit. Ruel Favors, M.D.    Medications: I have reviewed the  patient's current medications.  Assessment/Plan:  1. History of bilateral lower extremity deep vein thrombosis. She continues Coumadin 10 mg daily. 2. History of a positive lupus anticoagulant. 3. History of a positive beta-2-glycoprotein IgA antibody. 4. Chronic bilateral leg pain - negative bilateral Doppler January 2010. 5. History of hypertension. 6. COPD. 7. Status post hysterectomy. 8. Remote history of cocaine abuse. 9. History of an adrenal lesion on a CT scan in 2008 - status post a repeat CT on June 16, 2009, showing slow enlargement of the right adrenal nodule since 2006, most consistent with an adenoma. 10. History of rectal and vaginal bleeding in May 2009. 11. Status post carpal tunnel surgery. 12. 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 Right knee replacement September 2011. 13. Multiple orthopedic conditions. 14. Placement of IVC filter preoperatively before knee replacement. The IVC filter was retrieved 03/01/2011.  Disposition-[name] will continue Coumadin 10 mg daily. She will return in one month for a PT/INR. She will return for a followup visit in 6 months. She will contact the office in the interim with any problems.  Plan reviewed Dr. Truett Perna.    Lonna Cobb ANP/GNP-BC

## 2011-04-24 ENCOUNTER — Other Ambulatory Visit (HOSPITAL_BASED_OUTPATIENT_CLINIC_OR_DEPARTMENT_OTHER): Payer: Medicare Other | Admitting: Lab

## 2011-04-24 DIAGNOSIS — I82409 Acute embolism and thrombosis of unspecified deep veins of unspecified lower extremity: Secondary | ICD-10-CM

## 2011-04-24 LAB — PROTIME-INR
INR: 1.5 — ABNORMAL LOW (ref 2.00–3.50)
Protime: 18 Seconds — ABNORMAL HIGH (ref 10.6–13.4)

## 2011-04-25 ENCOUNTER — Telehealth: Payer: Self-pay | Admitting: *Deleted

## 2011-04-25 NOTE — Telephone Encounter (Signed)
Left VM for patient to call back to confirm her Coumadin dose 10 mg daily. Has she missed any doses or started any new meds?

## 2011-04-28 ENCOUNTER — Telehealth: Payer: Self-pay | Admitting: *Deleted

## 2011-04-28 NOTE — Telephone Encounter (Signed)
Spoke with pt, she denies missing any doses. Reports she "just ate a few greens". PT INR reviewed by Dr. Truett Perna: Continue same dose recheck in 3 weeks. Left instructions on voicemail for pt.

## 2011-05-10 IMAGING — US IR IVC FILTER PLACEMENT
1 series · 1 of 1 positions shown · non-contrast
Comparison: none

CLINICAL DATA: History of DVT, preoperative filter placement prior
to knee replacement for DVT/PE prophylaxis

[Series 1: ir ivc filter placement · 1 of 1 slices shown]
[im 1/1]
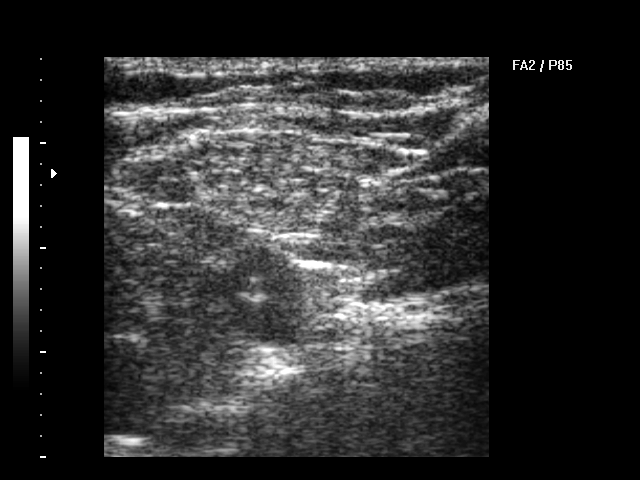

[1 of 1 positions shown; findings below may reference images not displayed]

ULTRASOUND GUIDANCE FOR VASCULAR ACCESS
IVC CATHETERIZATION AND VENOGRAM
IVC FILTER INSERTION

Date:  11/16/2008 [DATE]

Radiologist:  Irma Ines Saulle, M.D.

Medications:  4 mg Versed, 200 mcg Fentanyl

Guidance:  Ultrasound and fluoroscopic

Fluoroscopy time:  1.6 minutes

Sedation time:  16 minutes

Contrast Volume:  60 ml Qmnipaque-DGG

Complications:  No immediate

PROCEDURE/FINDINGS:

Informed consent was obtained from the patient following
explanation of the procedure, risks, benefits and alternatives.
The patient understands, agrees and consents for the procedure.
All questions were addressed.  A time out was performed.

Maximal barrier sterile technique utilized including caps, mask,
sterile gowns, sterile gloves, large sterile drape, hand hygiene,
and betadine prep.

Under sterile condition and local anesthesia, right internal
jugular venous access was performed with ultrasound.  Over a guide
wire, the IVC filter delivery sheath and inner dilator were
advanced into the IVC just above the IVC bifurcation.  Contrast
injection was performed for an IVC venogram.

IVC VENOGRAM:  The IVC is patent.  No evidence of thrombus,
stenosis, or occlusion.  No variant venous anatomy.  The renal
veins are identified at L1.

IVC FILTER INSERTION:  Through the delivery sheath, the Dionisis Virvilis
IVC filter was deployed in the infrarenal IVC at the L2-3 level
just below the renal veins and above the IVC bifurcation.  Contrast
injection confirmed position.  There is good apposition of the
filter against the IVC.

The delivery sheath was removed and hemostasis was obtained with
compression for 5 minutes.  The patient tolerated the procedure
well.  No immediate complications.
IMPRESSION: Ultrasound and fluoroscopically guided infrarenal IVC filter
insertion.

## 2011-05-10 IMAGING — CR DG CHEST 2V
2 series · 2 of 2 positions shown · non-contrast
Comparison: 07/15/2008

CLINICAL DATA: Preop for knee surgery

CHEST - 2 VIEW

[view not recorded (1 of 2)]
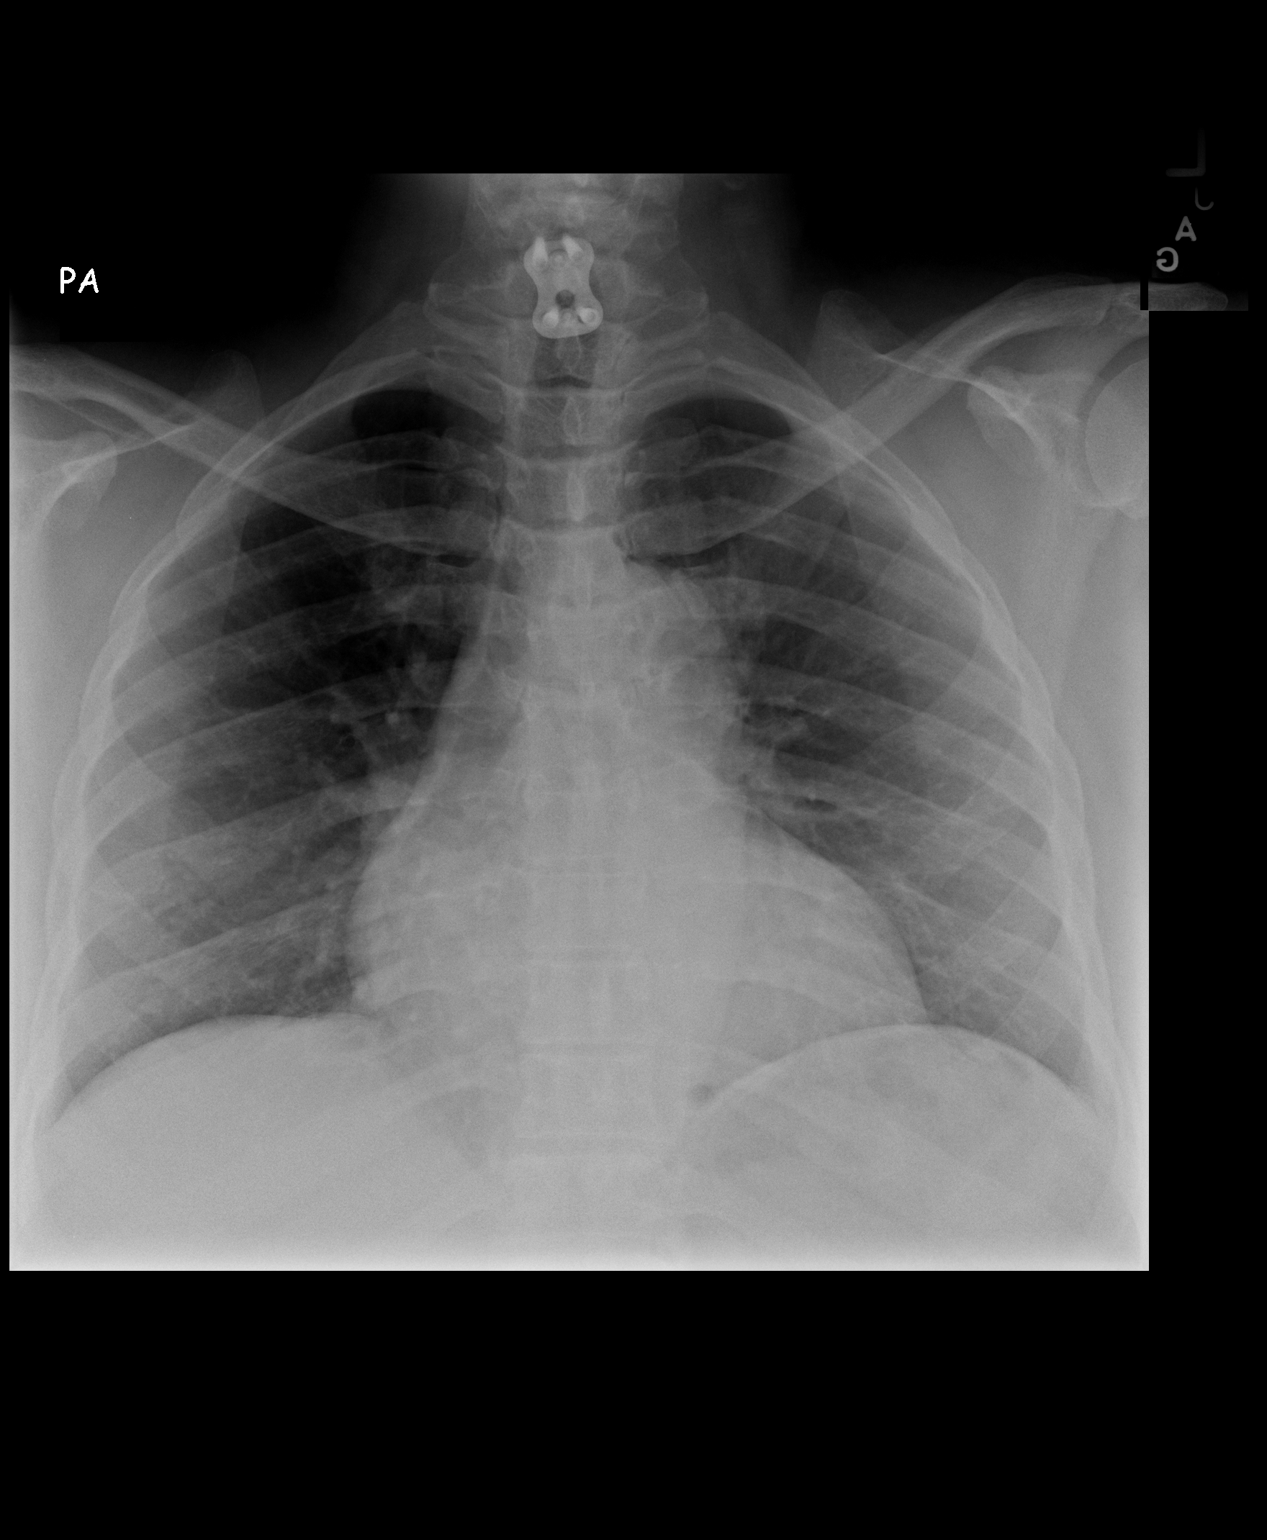

[view not recorded (2 of 2)]
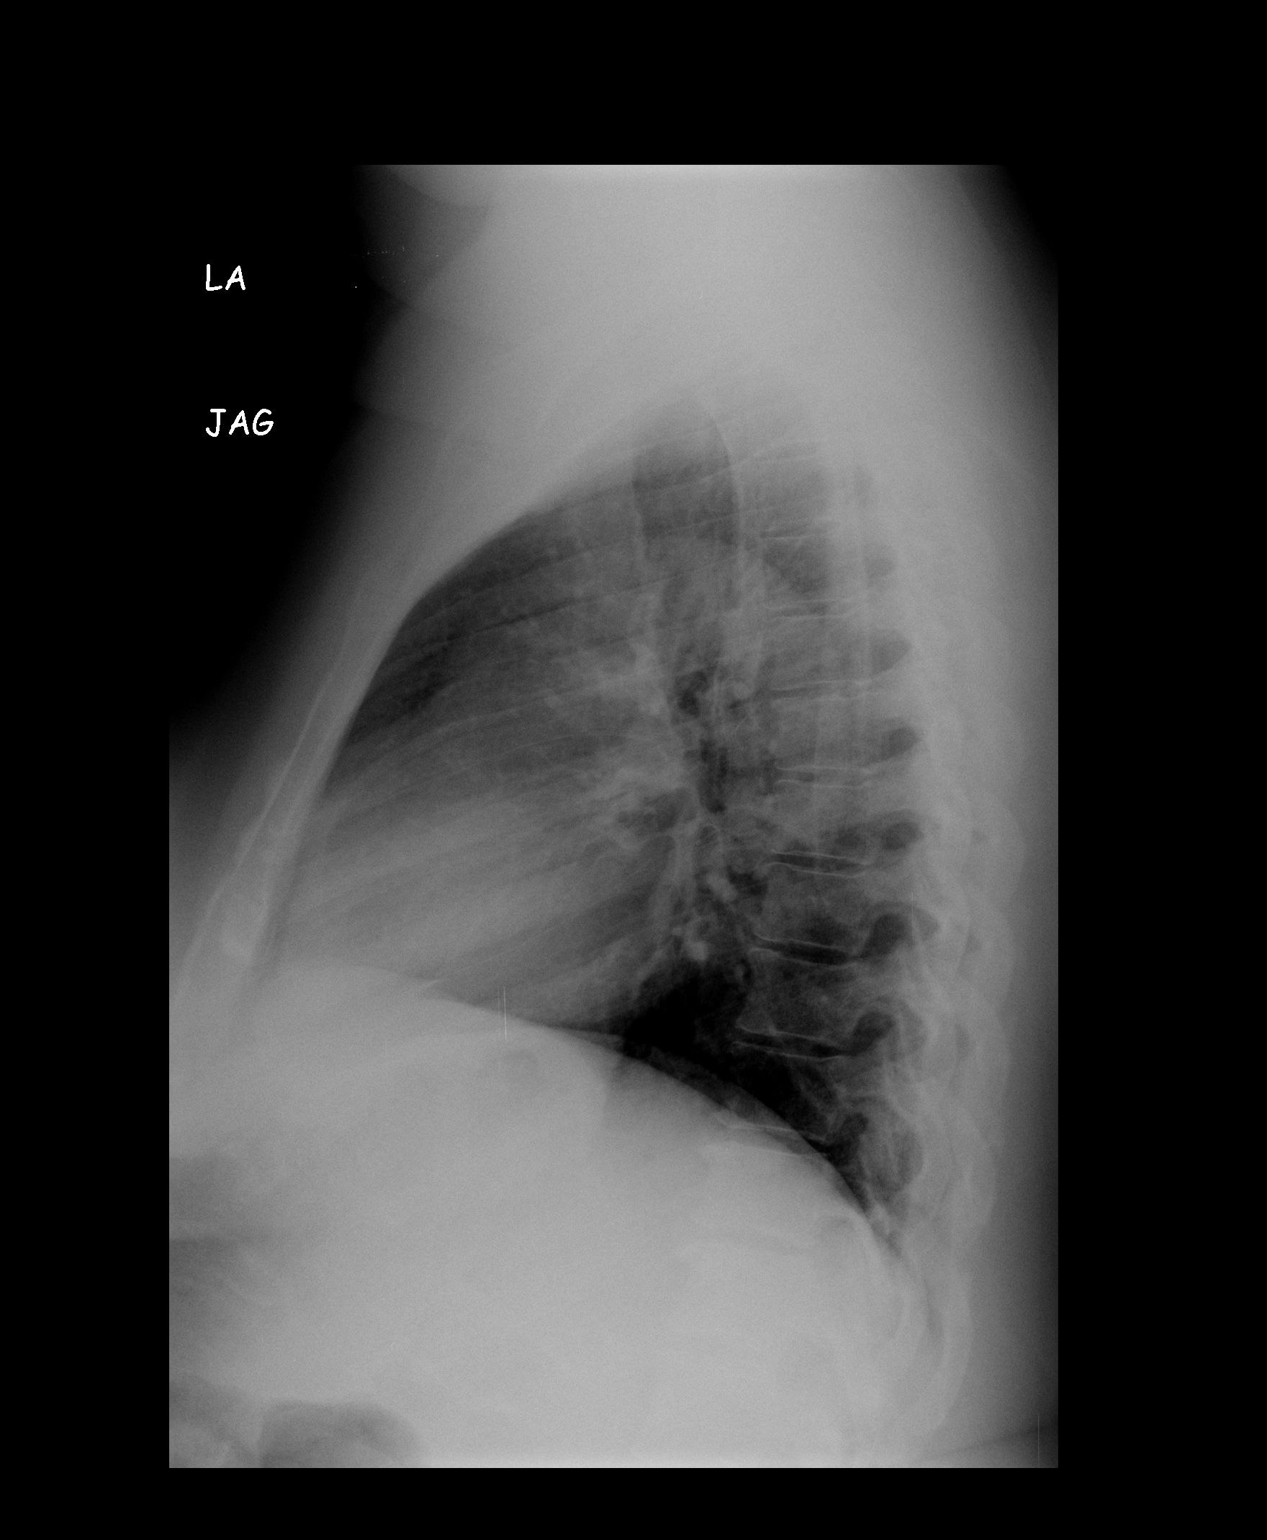

[2 of 2 positions shown; findings below may reference images not displayed]

FINDINGS: Heart mildly enlarged.  No congestive heart failure or
active disease.  No pleural fluid or osseous lesions.  Prior
cervical fusion plate.
IMPRESSION: Mild cardiomegaly - no active disease.

## 2011-05-11 IMAGING — CR DG KNEE 1-2V PORT*L*
2 series · 2 of 2 positions shown · non-contrast
Comparison: Portable exam 4911 hours compared to 10/19/2006

CLINICAL DATA: Osteoarthritis left knee status post surgery

PORTABLE LEFT KNEE - 1-2 VIEW

[view not recorded (1 of 2)]
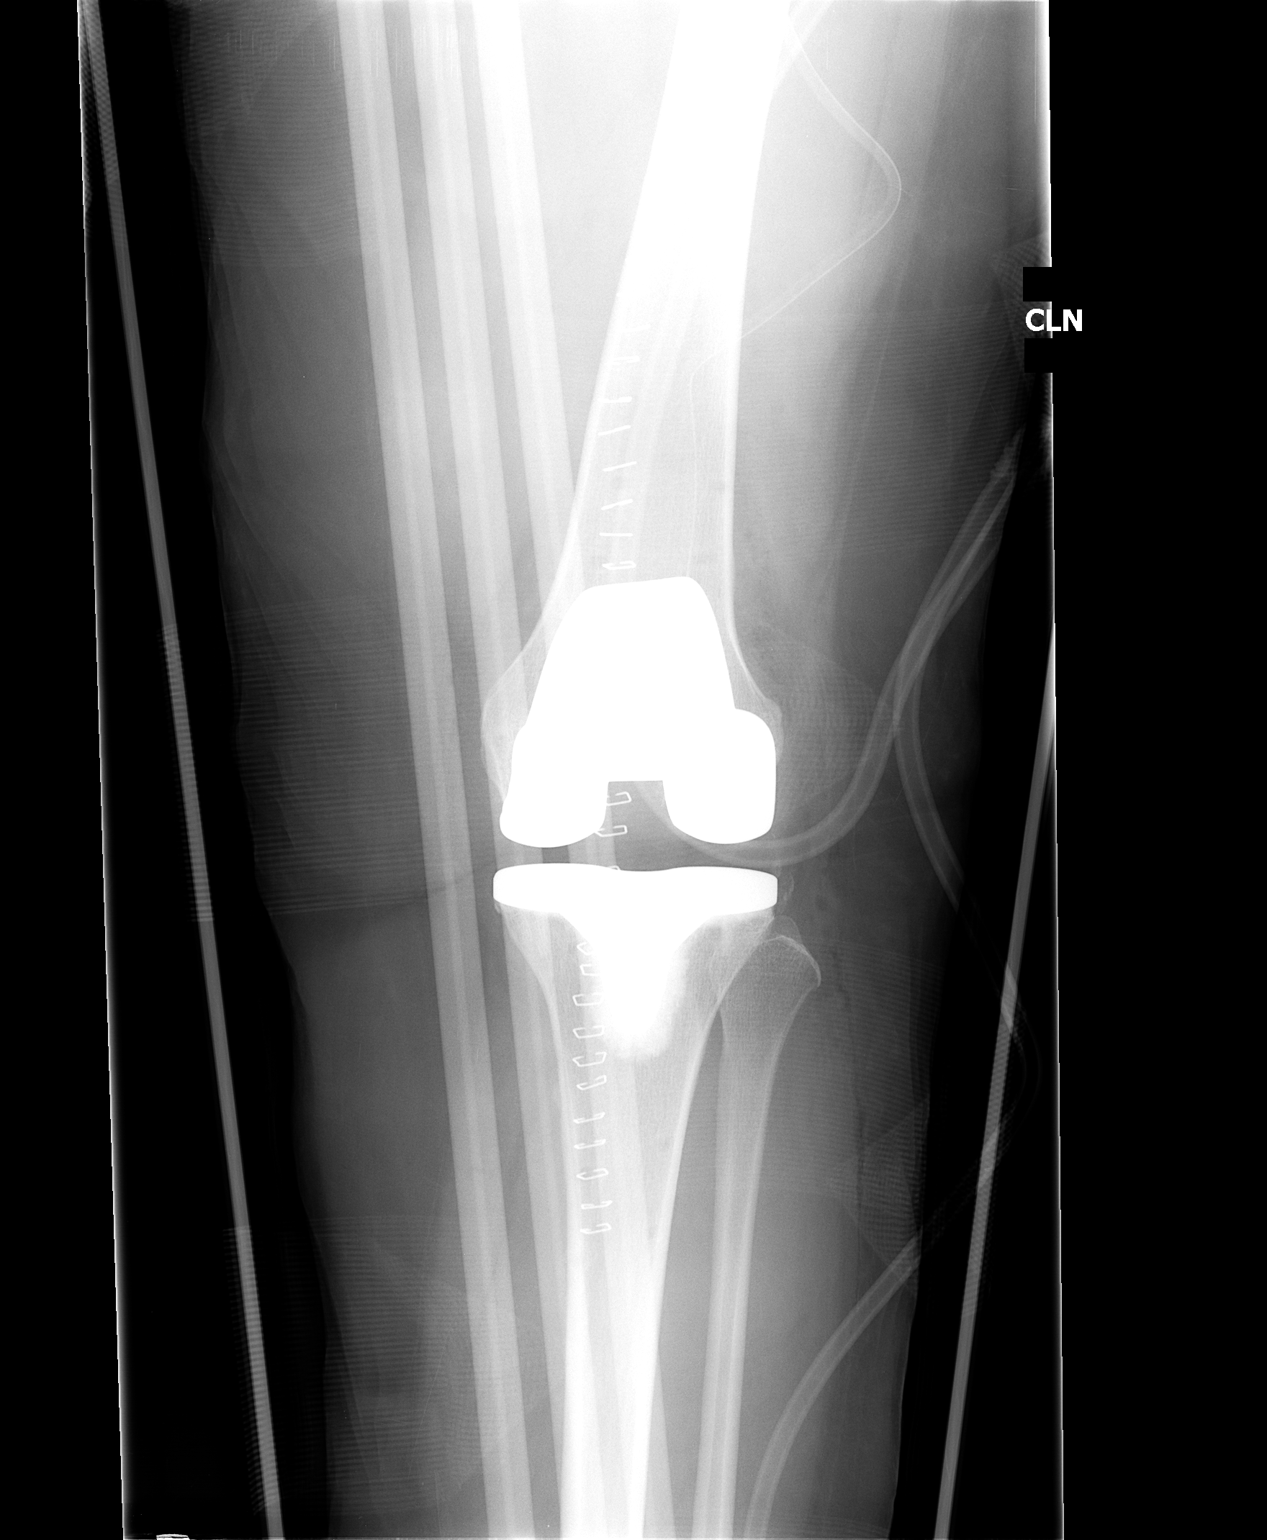

[view not recorded (2 of 2)]
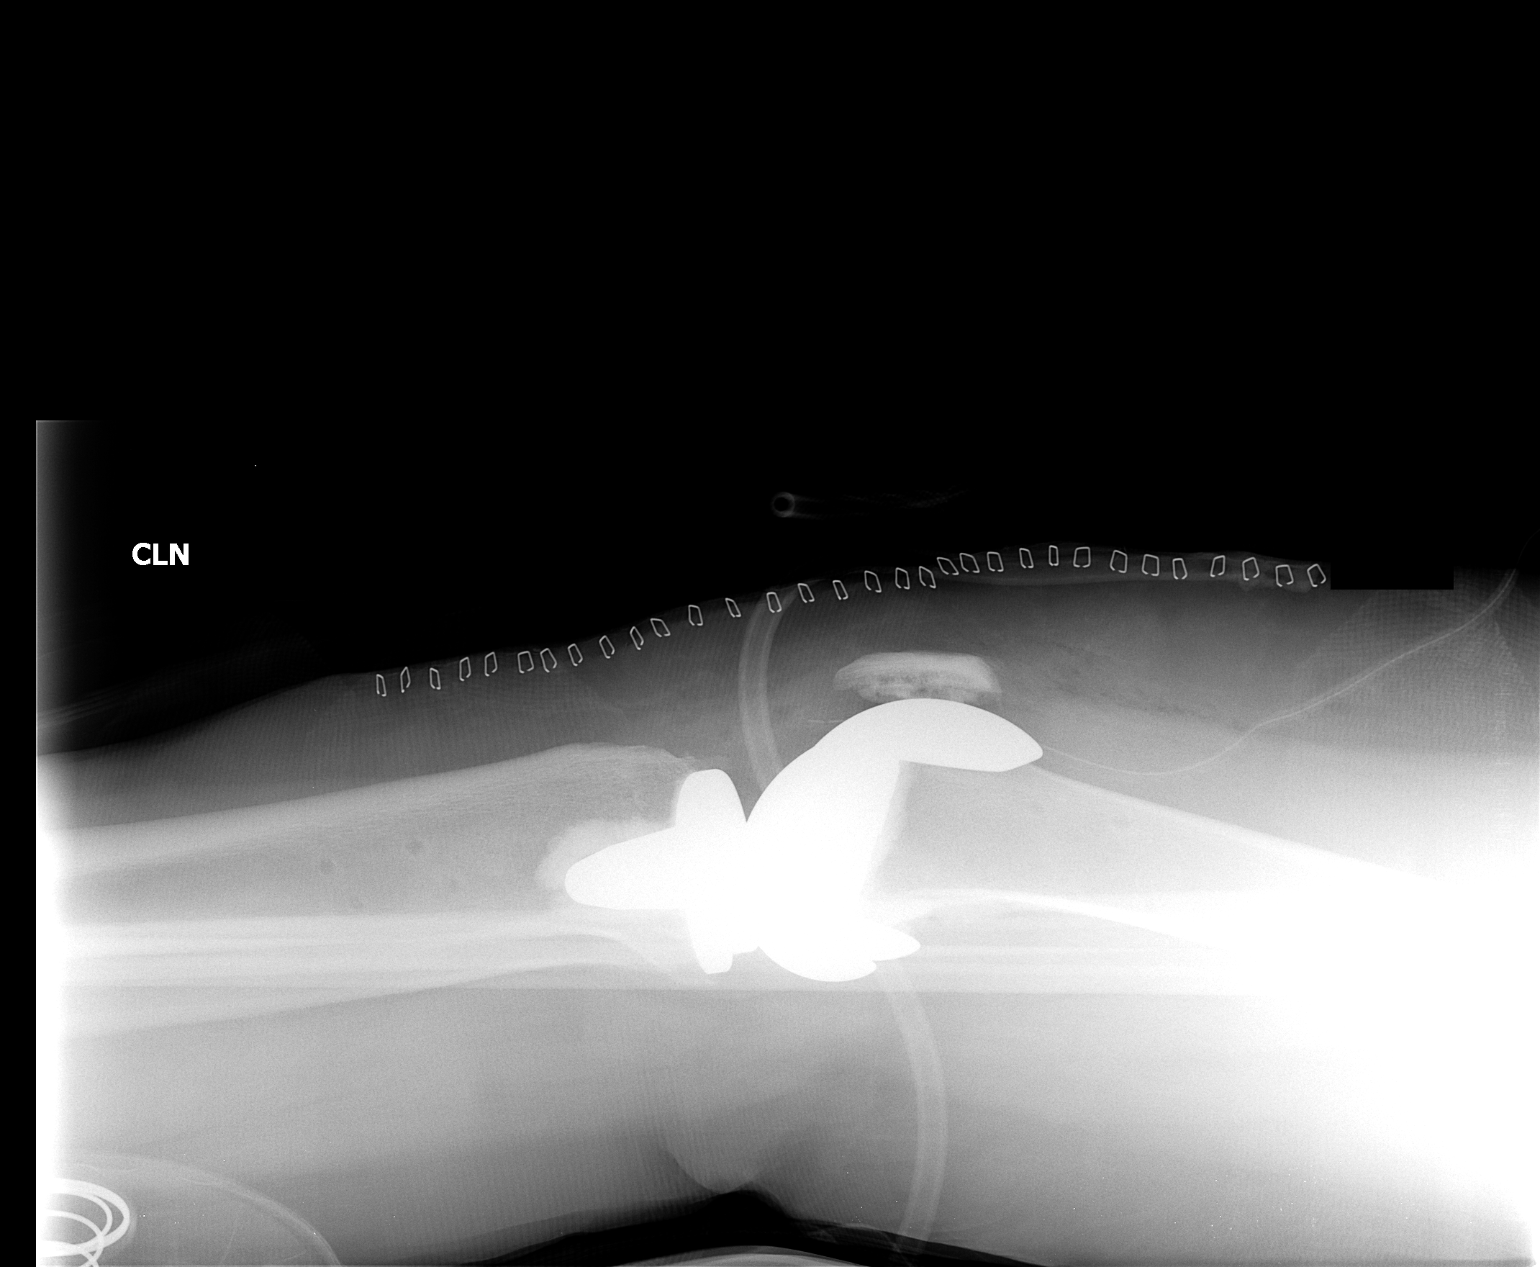

[2 of 2 positions shown; findings below may reference images not displayed]

FINDINGS: Components of left knee prosthesis in expected positions.
No fracture, dislocation or bone destruction.
Expected skin clips, soft tissue swelling, and anterior surgical
drain.
No periprosthetic lucency.
IMPRESSION: Postoperative changes of left total knee replacement without acute
complication.

## 2011-05-13 IMAGING — CR DG CHEST 1V PORT
1 series · 1 of 1 positions shown · non-contrast
Comparison: 11/16/2008

CLINICAL DATA: Chest pain.  Shortness of breath.  History of DVT.

PORTABLE CHEST - 1 VIEW

[AP]
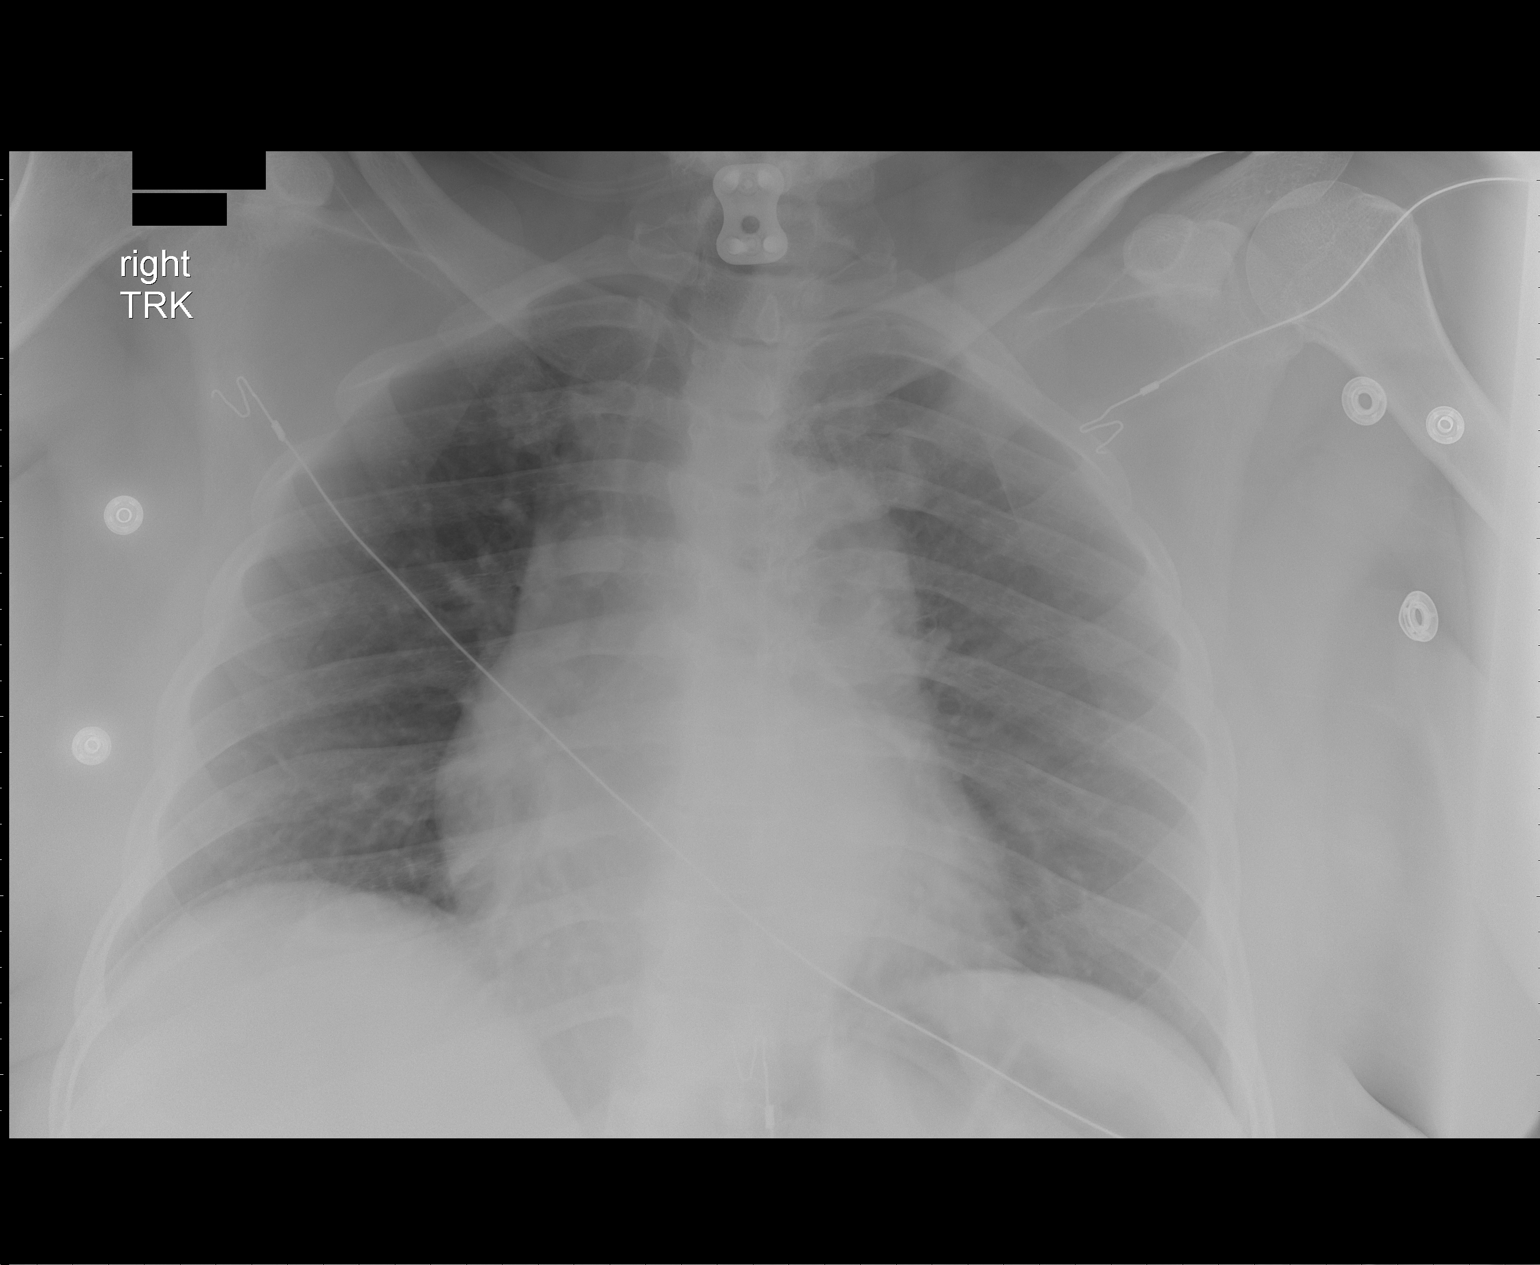

[1 of 1 positions shown; findings below may reference images not displayed]

FINDINGS: Prior lower cervical spine fixation.

The patient is rotated to the right. Cardiomegaly accentuated by AP
portable technique.  No pleural effusion or pneumothorax. Low lung
volumes with resultant pulmonary interstitial prominence.  No
congestive failure.  Clear lungs.
IMPRESSION: Cardiomegaly and low lung volumes without acute disease.

## 2011-05-17 IMAGING — CT CT ANGIO CHEST
2 of 6 series · 19 of 36 positions shown · IV contrast (APPLIED)
Comparison: 07/16/2008.

CLINICAL DATA: Shortness of breath status post left total knee
replacement.  Evaluate for pulmonary embolus.

CT ANGIOGRAPHY CHEST WITH CONTRAST
TECHNIQUE: Multidetector CT imaging of the chest was performed
using the standard protocol during bolus administration of
intravenous contrast. Multiplanar CT image reconstructions
including MIPs were obtained to evaluate the vascular anatomy.
Contrast: 100 cc Omnipaque 350.

[Series 6: pulm embolism 1.0 b25f thins · axial · 0.68mm/px · z∈[-346,-62]mm · 18 of 316 slices shown]
[im 16/316  lung]
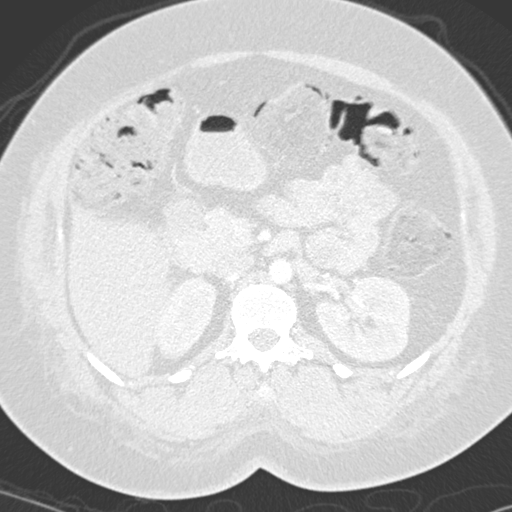
[im 32/316  mediastinal]
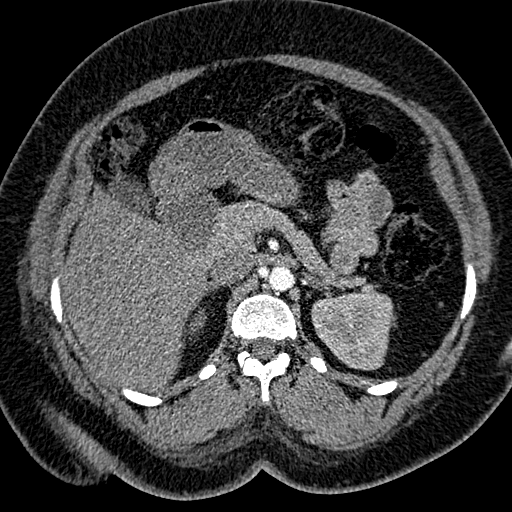
[im 48/316  lung]
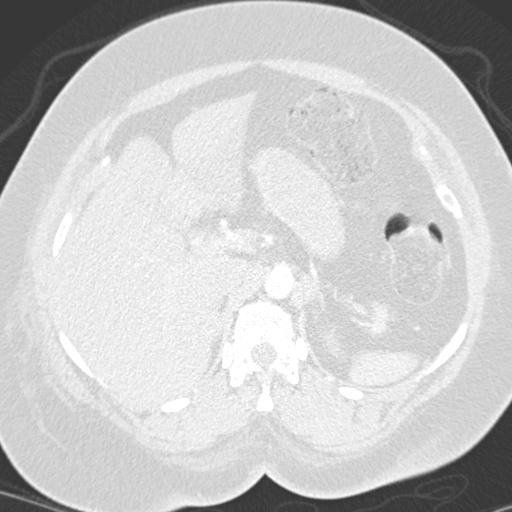
[im 64/316  mediastinal]
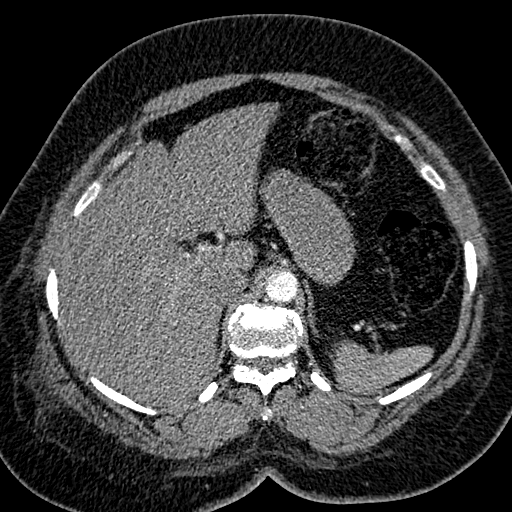
[im 79/316  lung]
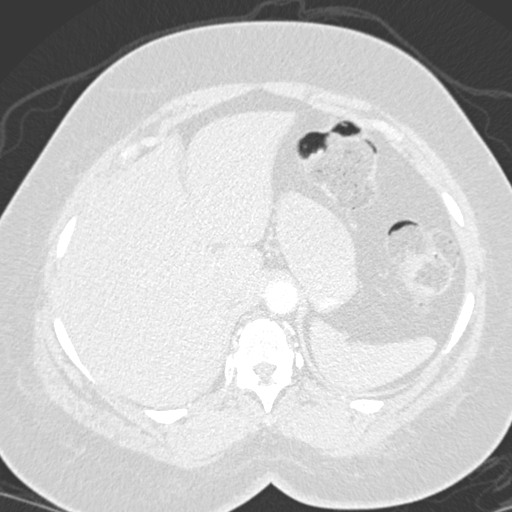
[im 95/316  mediastinal]
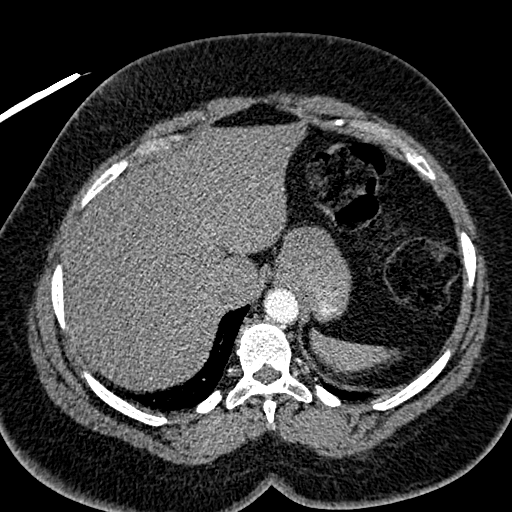
[im 111/316  lung]
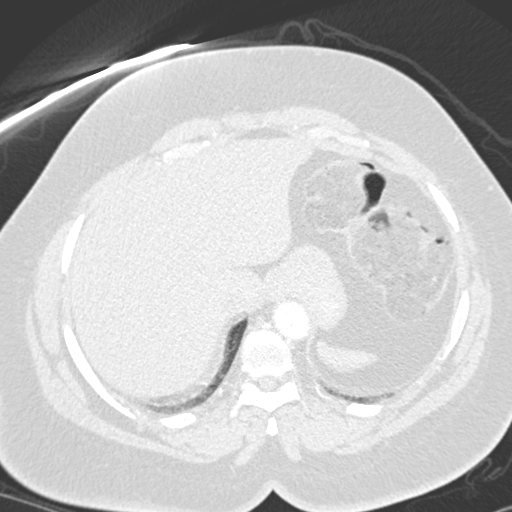
[im 127/316  mediastinal]
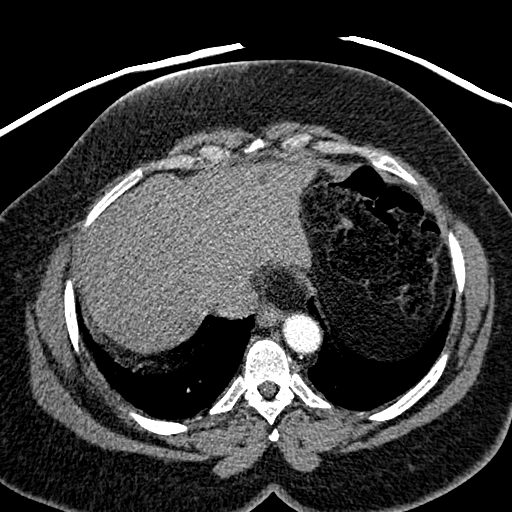
[im 142/316  lung]
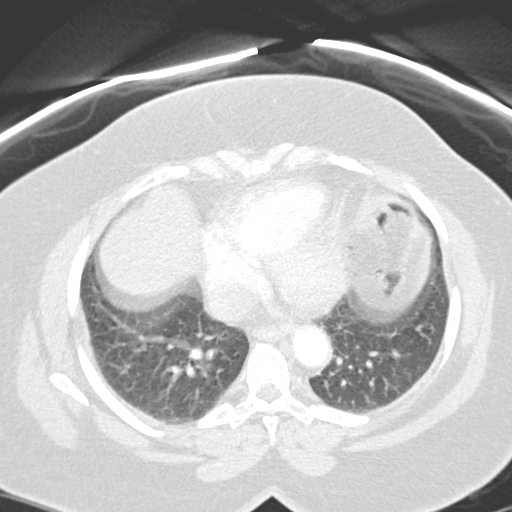
[im 174/316  mediastinal]
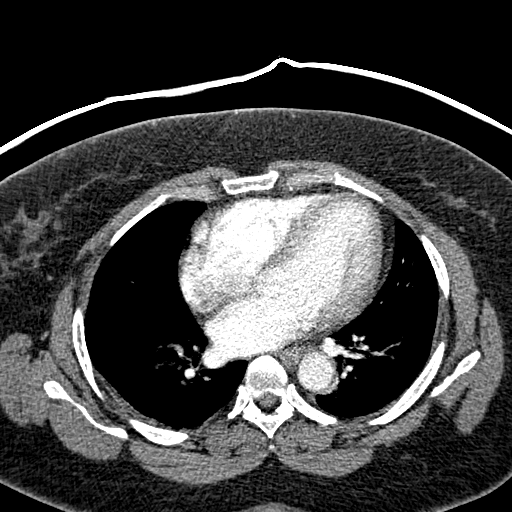
[im 190/316  lung]
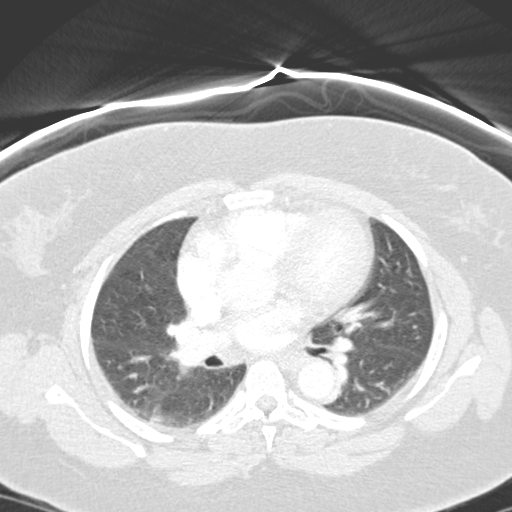
[im 205/316  mediastinal]
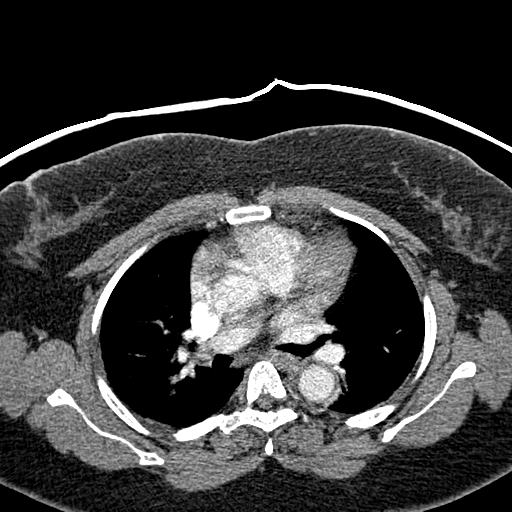
[im 221/316  lung]
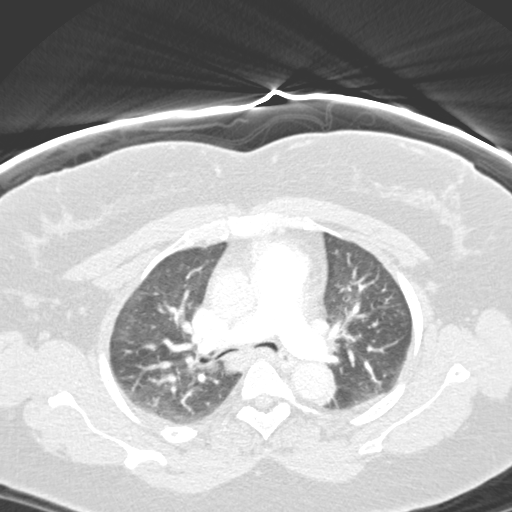
[im 237/316  mediastinal]
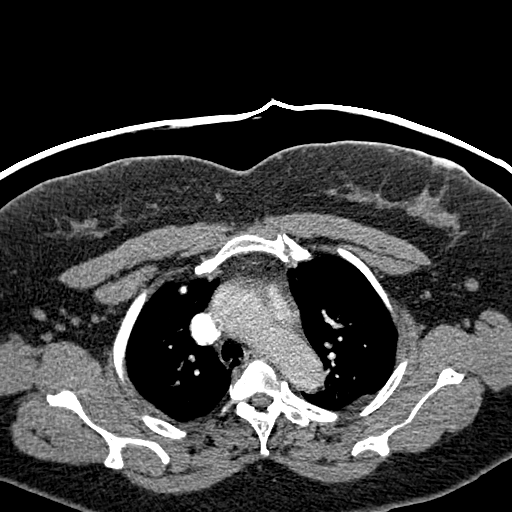
[im 253/316  lung]
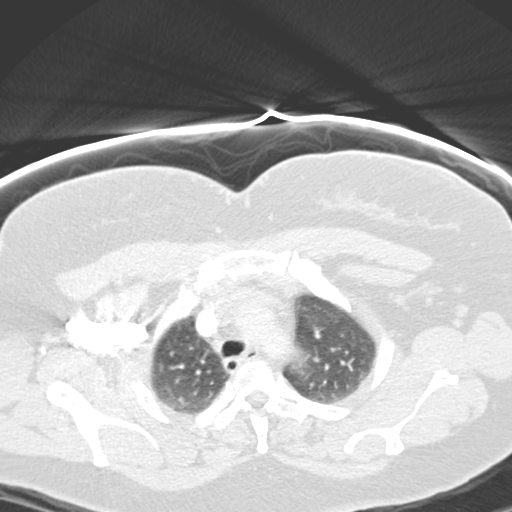
[im 268/316  mediastinal]
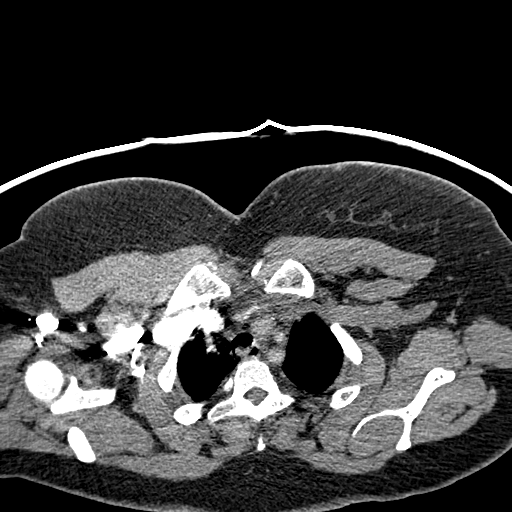
[im 284/316  lung]
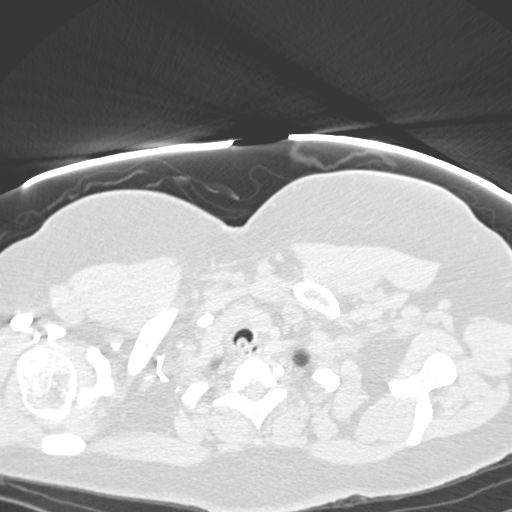
[im 300/316  mediastinal]
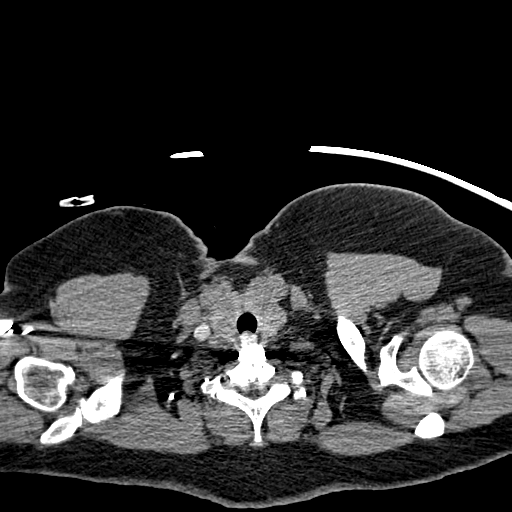

[Series 602: coronal mpr · coronal · 0.68mm/px · 1 of 71 slices shown]
[im 36/71  mediastinal]
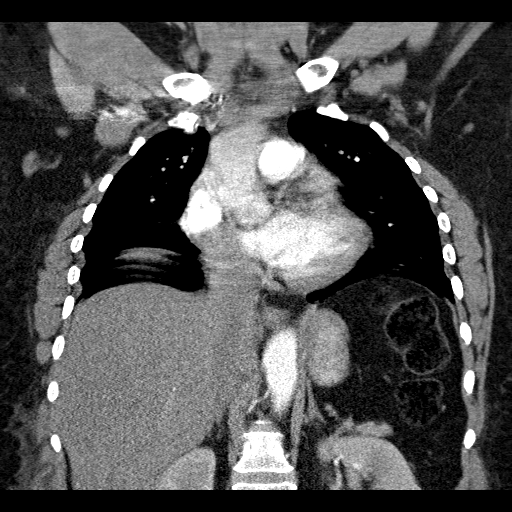

[19 of 36 positions shown; findings below may reference images not displayed]

FINDINGS: Exam is limited by patient breathing motion throughout
image acquisition.  Within this limitation, there is no filling
defect evident within the opacified pulmonary arteries to suggest
the presence of an acute pulmonary embolus.  No thoracic aortic
aneurysm.  The heart is enlarged.  There is no pericardial or
pleural effusion.

Because of the breathing motion, there is evidence of substantial
collapse of both mainstem bronchi, consistent with bronchomalacia.
There is also an underlying component of tracheomalacia.

Lung windows show areas of dependent atelectasis bilaterally.  No
focal airspace consolidation.  No evidence for a parenchymal nodule
or mass.

Bone windows reveal no worrisome lytic or sclerotic osseous
lesions.

Review of the MIP images confirms the above findings.
IMPRESSION: No CT evidence for acute pulmonary embolus.

Findings consistent with tracheaobronchomalacia.

No edema or focal airspace consolidation.

## 2011-05-22 ENCOUNTER — Other Ambulatory Visit: Payer: Self-pay | Admitting: *Deleted

## 2011-05-22 ENCOUNTER — Telehealth: Payer: Self-pay | Admitting: Oncology

## 2011-05-22 ENCOUNTER — Other Ambulatory Visit (HOSPITAL_BASED_OUTPATIENT_CLINIC_OR_DEPARTMENT_OTHER): Payer: Medicare Other | Admitting: Lab

## 2011-05-22 ENCOUNTER — Telehealth: Payer: Self-pay | Admitting: *Deleted

## 2011-05-22 DIAGNOSIS — I8000 Phlebitis and thrombophlebitis of superficial vessels of unspecified lower extremity: Secondary | ICD-10-CM

## 2011-05-22 DIAGNOSIS — I82409 Acute embolism and thrombosis of unspecified deep veins of unspecified lower extremity: Secondary | ICD-10-CM

## 2011-05-22 LAB — PROTIME-INR
INR: 4.1 — ABNORMAL HIGH (ref 2.00–3.50)
Protime: 49.2 Seconds — ABNORMAL HIGH (ref 10.6–13.4)

## 2011-05-22 NOTE — Telephone Encounter (Signed)
Left VM on home # to confirm her coumadin dose as 10 mg daily? Also instructed her to hold coumadin 3/4 and 3/5 and recheck on 3/6. Requested she call back to confirm. POF to scheduler.

## 2011-05-22 NOTE — Telephone Encounter (Signed)
called pts home lmovm with appt d/t for 03/06

## 2011-05-23 NOTE — Telephone Encounter (Signed)
Confirmed she had been on 10 mg daily. She took her Coumadin last night, but will hold dose today. Will be in tomorrow for recheck as ordered.

## 2011-05-24 ENCOUNTER — Other Ambulatory Visit: Payer: Self-pay | Admitting: *Deleted

## 2011-05-24 ENCOUNTER — Telehealth: Payer: Self-pay | Admitting: *Deleted

## 2011-05-24 ENCOUNTER — Other Ambulatory Visit (HOSPITAL_BASED_OUTPATIENT_CLINIC_OR_DEPARTMENT_OTHER): Payer: Medicare Other | Admitting: Lab

## 2011-05-24 DIAGNOSIS — Z7901 Long term (current) use of anticoagulants: Secondary | ICD-10-CM

## 2011-05-24 DIAGNOSIS — Z5181 Encounter for therapeutic drug level monitoring: Secondary | ICD-10-CM

## 2011-05-24 DIAGNOSIS — I8000 Phlebitis and thrombophlebitis of superficial vessels of unspecified lower extremity: Secondary | ICD-10-CM

## 2011-05-24 DIAGNOSIS — Z86718 Personal history of other venous thrombosis and embolism: Secondary | ICD-10-CM

## 2011-05-24 LAB — PROTIME-INR
INR: 3.5 (ref 2.00–3.50)
Protime: 42 Seconds — ABNORMAL HIGH (ref 10.6–13.4)

## 2011-05-24 NOTE — Telephone Encounter (Signed)
Left VM at home # to confirm again she is on no new meds/antibiotics. Hold coumadin again today and starting on 3/7 start 10 mg on MWF and 7.5 mg all other days. Will recheck in 1 week. Requested she call back to confirm with nurse and be sure she has proper strength of coumadin to provide easy dosing.

## 2011-05-25 ENCOUNTER — Telehealth: Payer: Self-pay | Admitting: *Deleted

## 2011-05-25 ENCOUNTER — Telehealth: Payer: Self-pay | Admitting: Oncology

## 2011-05-25 ENCOUNTER — Other Ambulatory Visit: Payer: Self-pay | Admitting: *Deleted

## 2011-05-25 DIAGNOSIS — I8000 Phlebitis and thrombophlebitis of superficial vessels of unspecified lower extremity: Secondary | ICD-10-CM

## 2011-05-25 NOTE — Telephone Encounter (Signed)
pt called and scheduled lab for 03/13

## 2011-05-25 NOTE — Telephone Encounter (Signed)
Call from pt reporting she had already taken her Coumadin on 05/24/11 before she heard the message. Pt states she forgot to mention she is taking an antibiotic for sinus infection.  Reviewed with Misty Stanley, NP. Pt to come in 05/26/11 to repeat lab. Pt verbalized understanding. Instructed her to HOLD COUMADIN. She verbalized understanding.

## 2011-05-26 ENCOUNTER — Ambulatory Visit (HOSPITAL_BASED_OUTPATIENT_CLINIC_OR_DEPARTMENT_OTHER): Payer: Medicare Other | Admitting: Lab

## 2011-05-26 DIAGNOSIS — I8000 Phlebitis and thrombophlebitis of superficial vessels of unspecified lower extremity: Secondary | ICD-10-CM

## 2011-05-26 DIAGNOSIS — Z5181 Encounter for therapeutic drug level monitoring: Secondary | ICD-10-CM

## 2011-05-26 DIAGNOSIS — Z7901 Long term (current) use of anticoagulants: Secondary | ICD-10-CM

## 2011-05-26 DIAGNOSIS — I82409 Acute embolism and thrombosis of unspecified deep veins of unspecified lower extremity: Secondary | ICD-10-CM

## 2011-05-26 LAB — PROTIME-INR
INR: 2.4 (ref 2.00–3.50)
Protime: 28.8 Seconds — ABNORMAL HIGH (ref 10.6–13.4)

## 2011-05-31 ENCOUNTER — Other Ambulatory Visit: Payer: Self-pay | Admitting: Lab

## 2011-05-31 ENCOUNTER — Telehealth: Payer: Self-pay | Admitting: Oncology

## 2011-05-31 NOTE — Telephone Encounter (Signed)
pt rtn call and confrimed appt for 03/13

## 2011-06-01 ENCOUNTER — Other Ambulatory Visit (HOSPITAL_BASED_OUTPATIENT_CLINIC_OR_DEPARTMENT_OTHER): Payer: Medicare Other

## 2011-06-01 DIAGNOSIS — I8000 Phlebitis and thrombophlebitis of superficial vessels of unspecified lower extremity: Secondary | ICD-10-CM

## 2011-06-01 LAB — PROTIME-INR
INR: 2.3 (ref 2.00–3.50)
Protime: 27.6 Seconds — ABNORMAL HIGH (ref 10.6–13.4)

## 2011-06-02 ENCOUNTER — Telehealth: Payer: Self-pay | Admitting: *Deleted

## 2011-06-02 NOTE — Telephone Encounter (Signed)
Called pt: Continue Coumadin 5mg  daily, per Dr. Truett Perna. She verbalized understanding.

## 2011-06-09 IMAGING — US IR [PERSON_NAME]/[PERSON_NAME]
1 series · 2 of 2 positions shown · non-contrast
Comparison: none

Clinical Data/Indication: IVC FILTER REMOVAL.  THE THE PATIENT IS
ANTICOAGULATED

INFERIOR VENA CAVOGRAM,TRANSCATH RETRIEVAL FOREIGN BODY,ULTRASOUND
VENOUS ACCESS
Sedation: Versed 4 mg, Fentanyl 150 mg.
Total Moderate Sedation Time: 29 minutes.
Contrast Volume: 90 ml 4mnipaque-5JJ.
Additional Medications: None.
Fluoroscopy Time: 3.6 minutes.
Procedure: The procedure, risks, benefits, and alternatives were
explained to the patient. Questions regarding the procedure were
encouraged and answered. The patient understands and consents to
the procedure.
The right neck was prepped with betadine in a sterile fashion, and
a sterile drape was applied covering the operative field. A sterile
gown and sterile gloves were used for the procedure.
Under sonographic guidance, a micropuncture needle was inserted
into the right internal jugular vein which was noted to be patent.
It was removed over a 018 wire which was upsized to Savely Baglietto.
Sonographic and fluoroscopic documentation was obtained.
Sonographic evaluation of the neck demonstrates a right thyroid
nodule.
 A 5-French pigtail was advanced over the Asi through the filter
into the lower IVC.  Venography was performed.
The pigtail was exchanged over the Asi for a the filter
retrieval sheath.  The filter was snared without difficulty and
removed.  Repeat venography via the sheath and pigtail was
performed.  The sheath was removed and hemostasis was achieved
direct pressure.

[Series 1: ir (person_name)/(person_name) · 2 of 2 slices shown]
[im 1/2]
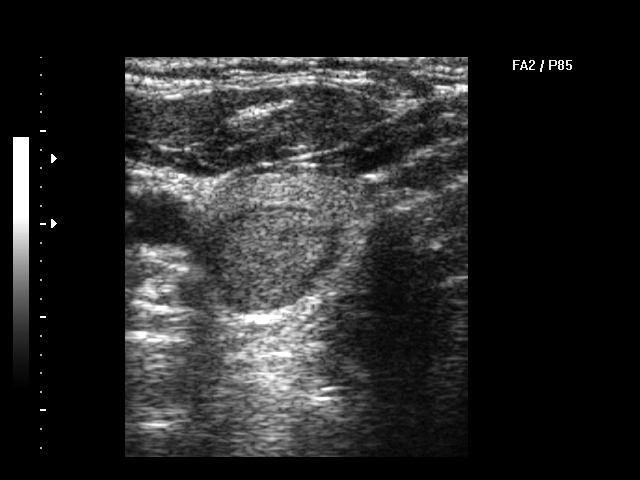
[im 2/2]
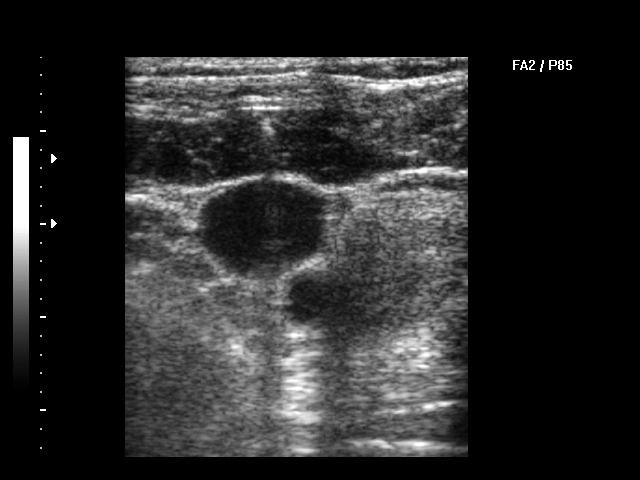

[2 of 2 positions shown; findings below may reference images not displayed]

FINDINGS: Initial venography demonstrates patency through the
sheath without thrombus formation.

Post filter retrieval, venography demonstrates no interval injury
in the IVC.

Complications: None
IMPRESSION: Successful IVC filter retrieval without complication.

Right thyroid nodule.  Thyroid ultrasound will be performed
subsequently with potential biopsy.

## 2011-06-09 IMAGING — XA IR [PERSON_NAME]/[PERSON_NAME]
1 series · 12 of 16 positions shown · non-contrast
Comparison: none

Clinical Data/Indication: IVC FILTER REMOVAL.  THE THE PATIENT IS
ANTICOAGULATED

INFERIOR VENA CAVOGRAM,TRANSCATH RETRIEVAL FOREIGN BODY,ULTRASOUND
VENOUS ACCESS
Sedation: Versed 4 mg, Fentanyl 150 mg.
Total Moderate Sedation Time: 29 minutes.
Contrast Volume: 90 ml 4mnipaque-5JJ.
Additional Medications: None.
Fluoroscopy Time: 3.6 minutes.
Procedure: The procedure, risks, benefits, and alternatives were
explained to the patient. Questions regarding the procedure were
encouraged and answered. The patient understands and consents to
the procedure.
The right neck was prepped with betadine in a sterile fashion, and
a sterile drape was applied covering the operative field. A sterile
gown and sterile gloves were used for the procedure.
Under sonographic guidance, a micropuncture needle was inserted
into the right internal jugular vein which was noted to be patent.
It was removed over a 018 wire which was upsized to Savely Baglietto.
Sonographic and fluoroscopic documentation was obtained.
Sonographic evaluation of the neck demonstrates a right thyroid
nodule.
 A 5-French pigtail was advanced over the Asi through the filter
into the lower IVC.  Venography was performed.
The pigtail was exchanged over the Asi for a the filter
retrieval sheath.  The filter was snared without difficulty and
removed.  Repeat venography via the sheath and pigtail was
performed.  The sheath was removed and hemostasis was achieved
direct pressure.

[Series 1: run · 12 of 16 slices shown]
[im 1/16]
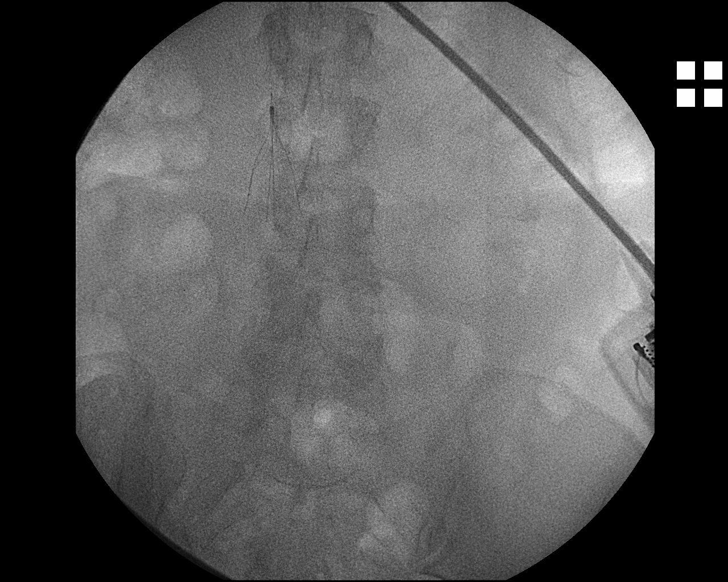
[im 3/16]
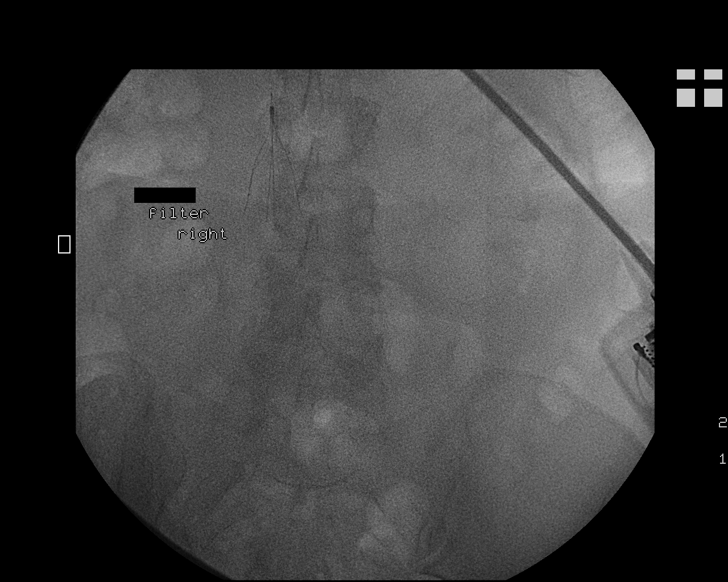
[im 4/16]
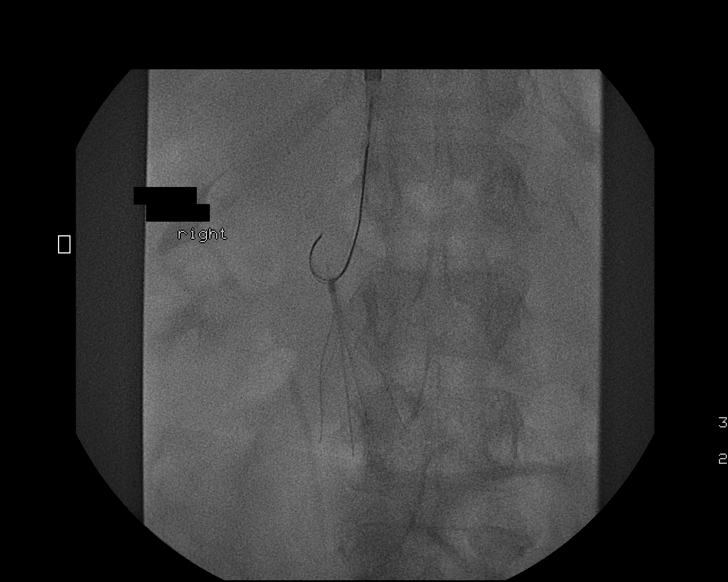
[im 5/16]
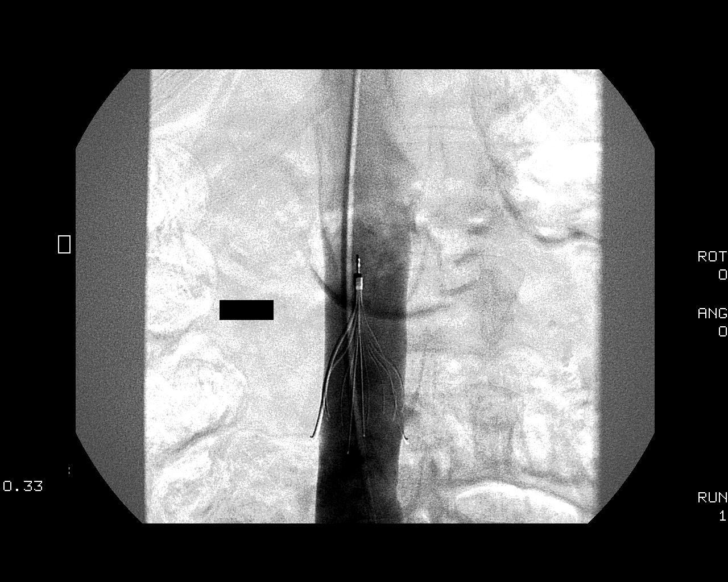
[im 7/16]
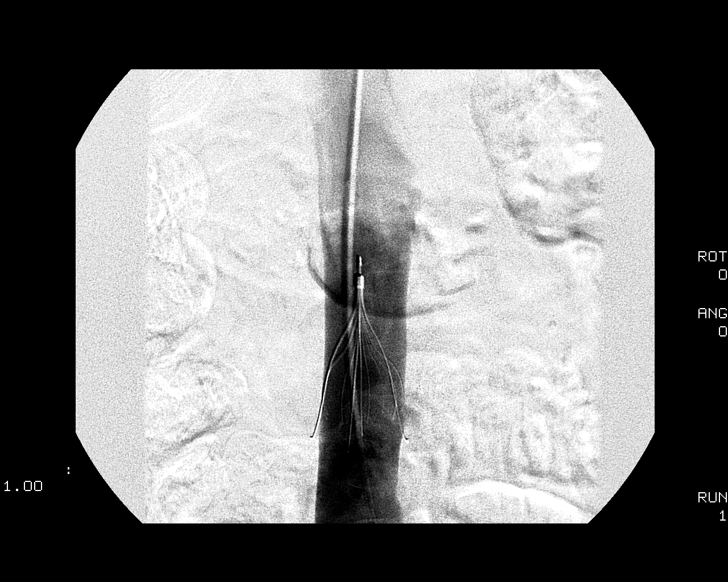
[im 8/16]
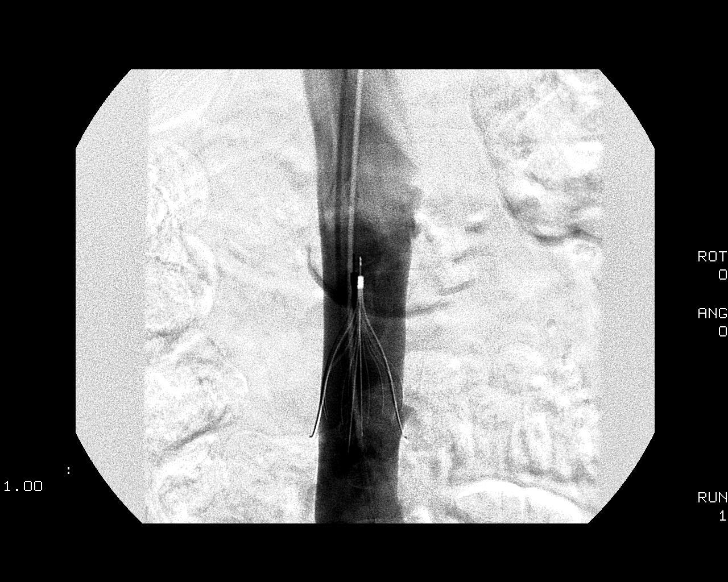
[im 9/16]
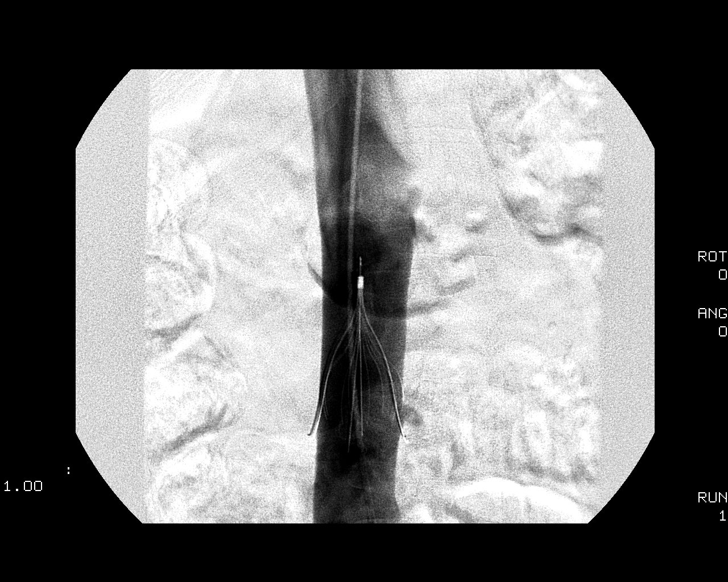
[im 11/16]
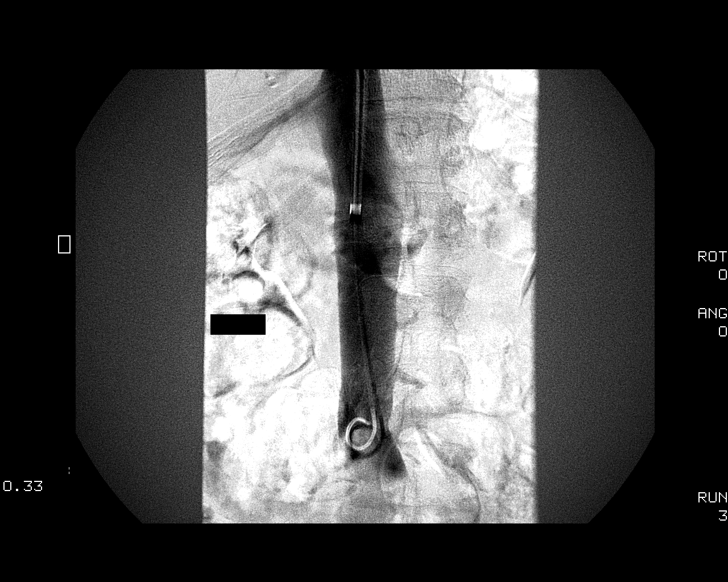
[im 12/16]
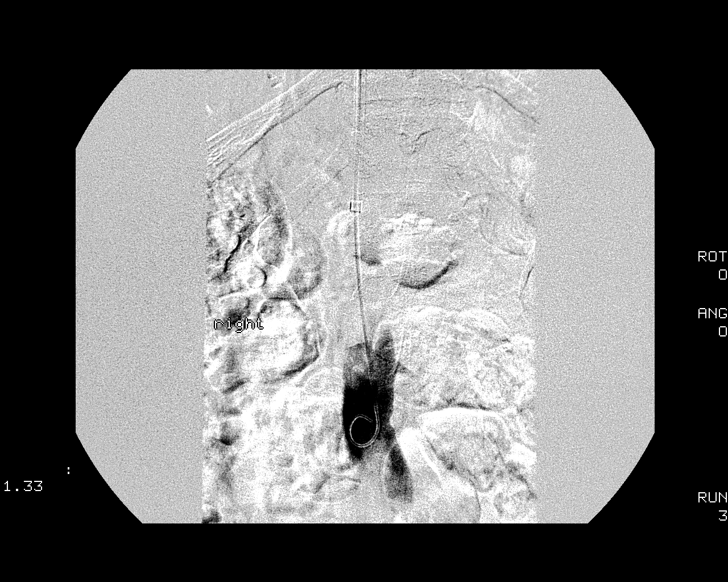
[im 13/16]
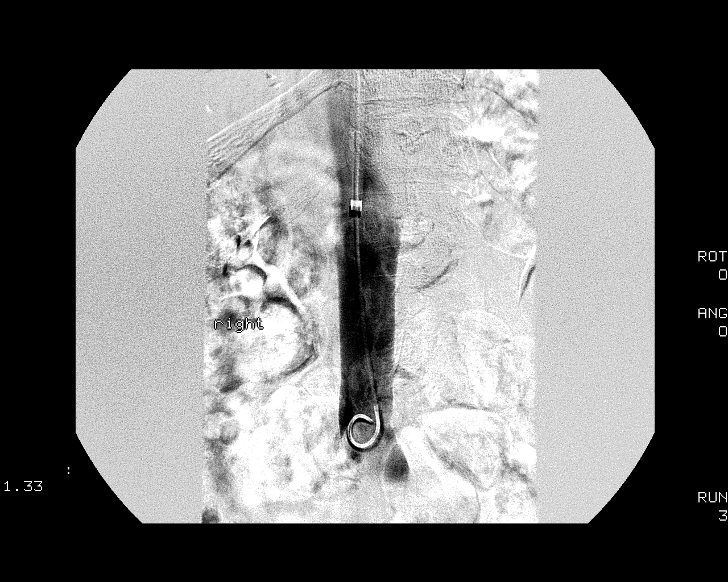
[im 15/16]
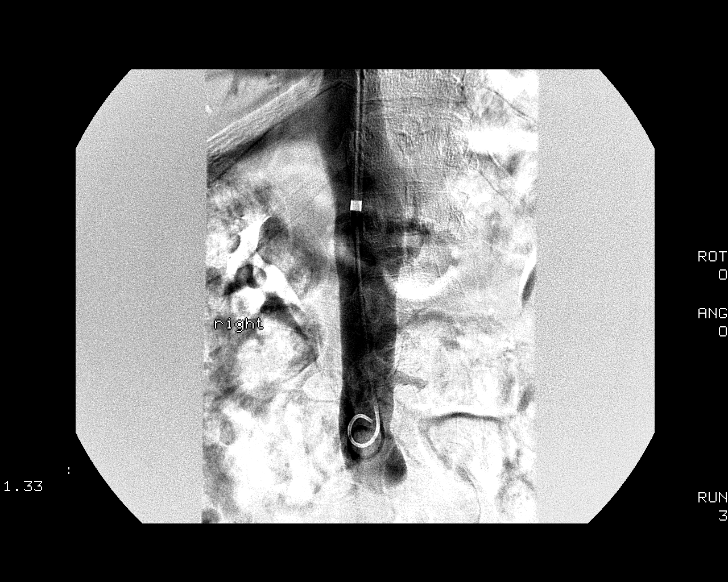
[im 16/16]
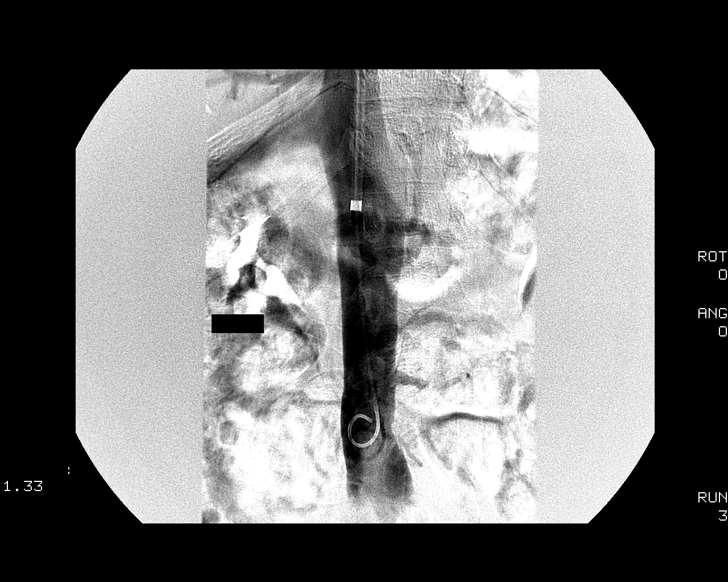

[12 of 16 positions shown; findings below may reference images not displayed]

FINDINGS: Initial venography demonstrates patency through the
sheath without thrombus formation.

Post filter retrieval, venography demonstrates no interval injury
in the IVC.

Complications: None
IMPRESSION: Successful IVC filter retrieval without complication.

Right thyroid nodule.  Thyroid ultrasound will be performed
subsequently with potential biopsy.

## 2011-06-19 ENCOUNTER — Other Ambulatory Visit: Payer: Self-pay | Admitting: Lab

## 2011-07-13 IMAGING — MG MM SCREEN MAMMOGRAM BILATERAL
5 series · 5 of 5 positions shown · non-contrast
Comparison: none

DG SCREEN MAMMOGRAM BILATERAL
Bilateral CC and MLO view(s) were taken.

DIGITAL SCREENING MAMMOGRAM WITH CAD:
There are scattered fibroglandular densities.  No masses or malignant type calcifications are 
identified.  Compared with prior studies.
Images were processed with CAD.

[R CC]
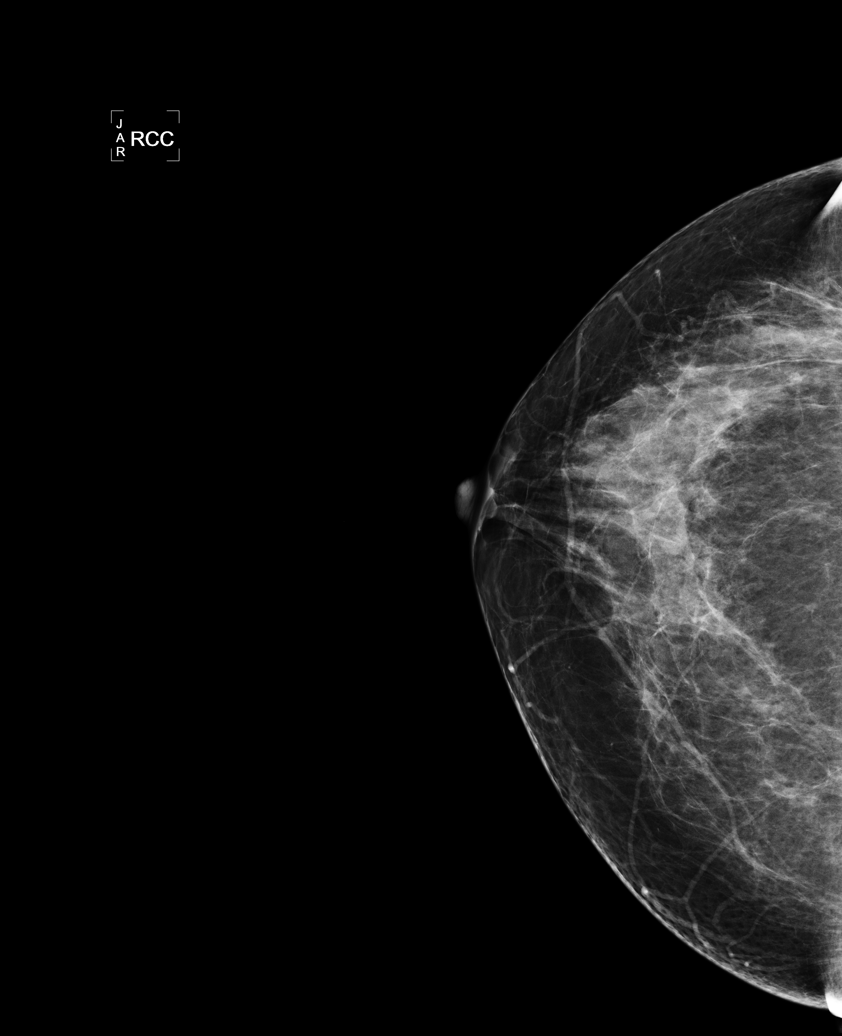

[L CC (1 of 2)]
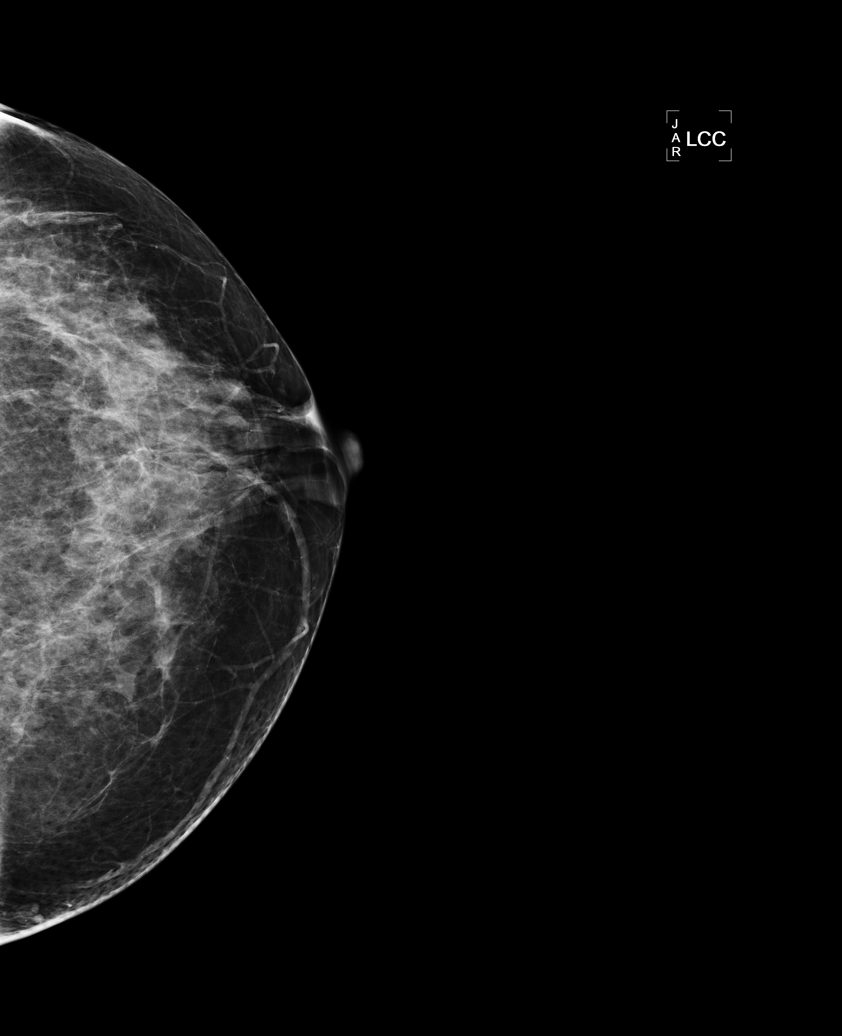

[L MLO]
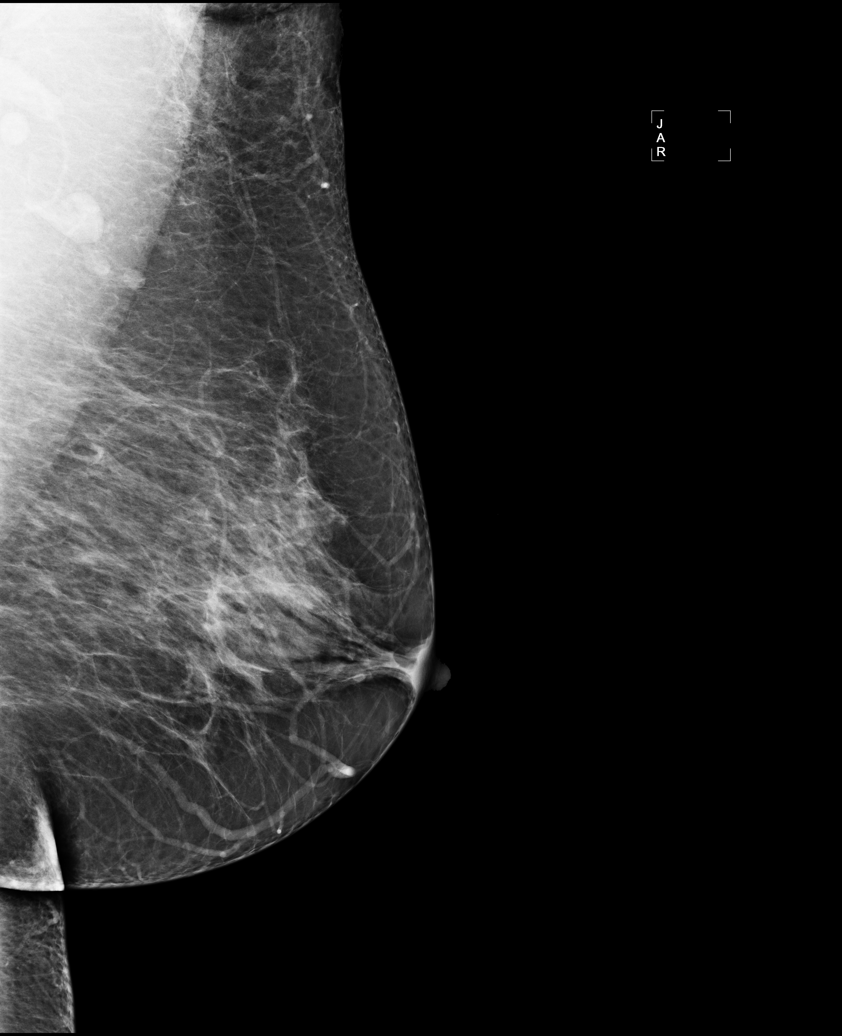

[R MLO]
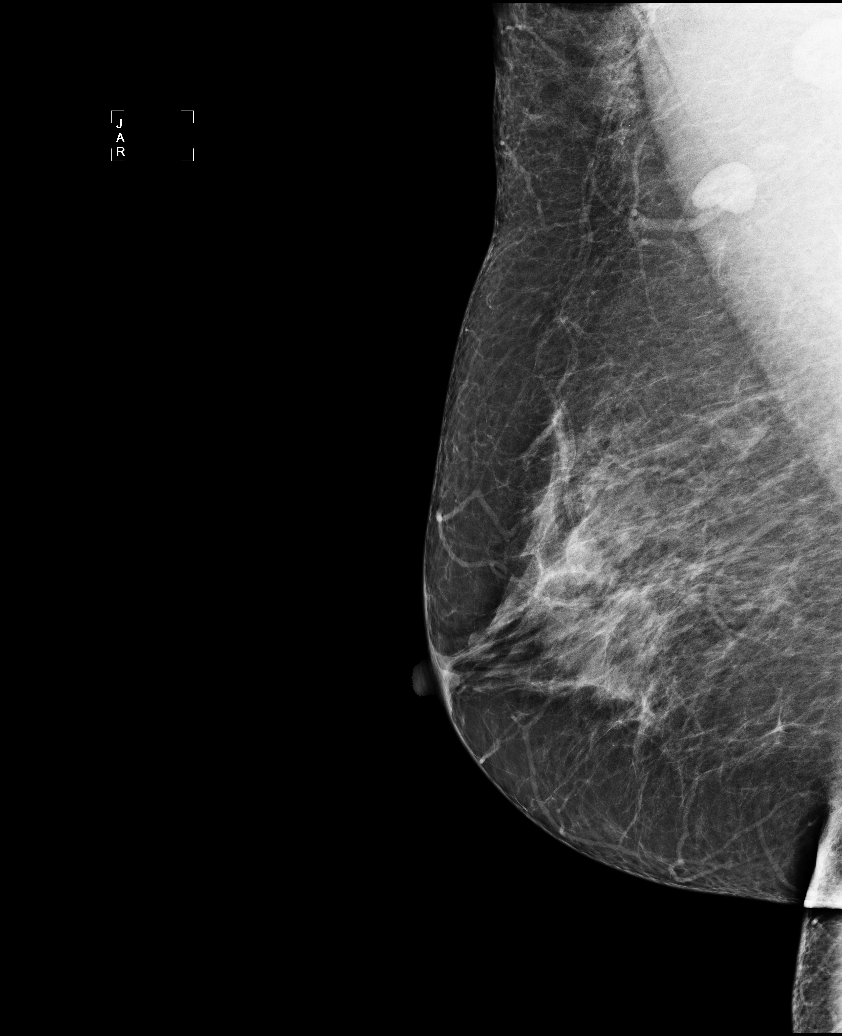

[L CC (2 of 2)]
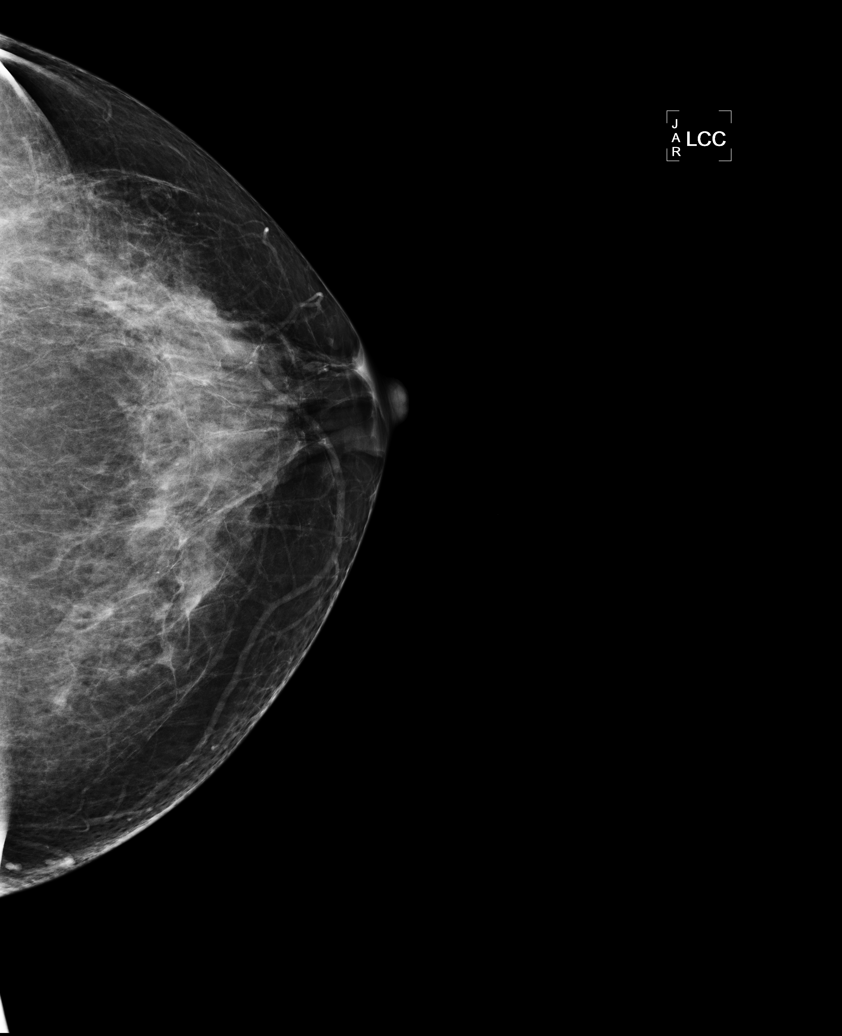

[5 of 5 positions shown; findings below may reference images not displayed]

IMPRESSION: No specific mammographic evidence of malignancy.  Next screening mammogram is recommended in one 
year.

A result letter of this screening mammogram will be mailed directly to the patient.

ASSESSMENT: Negative - BI-RADS 1

Screening mammogram in 1 year.
,

## 2011-07-17 ENCOUNTER — Telehealth: Payer: Self-pay | Admitting: *Deleted

## 2011-07-17 ENCOUNTER — Other Ambulatory Visit (HOSPITAL_BASED_OUTPATIENT_CLINIC_OR_DEPARTMENT_OTHER): Payer: Medicare Other | Admitting: Lab

## 2011-07-17 DIAGNOSIS — I82409 Acute embolism and thrombosis of unspecified deep veins of unspecified lower extremity: Secondary | ICD-10-CM

## 2011-07-17 LAB — PROTIME-INR
INR: 1.1 — ABNORMAL LOW (ref 2.00–3.50)
Protime: 13.2 Seconds (ref 10.6–13.4)

## 2011-07-17 NOTE — Telephone Encounter (Signed)
Left VM for patient to call and confirm her coumadin dose of 5 mg daily (?). Any med or diet changes or missed doses?

## 2011-07-17 NOTE — Telephone Encounter (Signed)
Left message for patient asking what antibiotic she is taking and how long she will be on it.

## 2011-07-19 ENCOUNTER — Telehealth: Payer: Self-pay | Admitting: *Deleted

## 2011-07-19 ENCOUNTER — Other Ambulatory Visit: Payer: Self-pay | Admitting: *Deleted

## 2011-07-19 DIAGNOSIS — Z7901 Long term (current) use of anticoagulants: Secondary | ICD-10-CM

## 2011-07-19 NOTE — Telephone Encounter (Signed)
Called pt, she states she has been off antibiotics for about a month. Pt began Coumadin 10 mg on 07/18/11. Reviewed with Misty Stanley, continue 10 mg. Recheck lab 5/3. Pt understands to expect call from schedulers.

## 2011-07-20 ENCOUNTER — Telehealth: Payer: Self-pay | Admitting: Oncology

## 2011-07-20 NOTE — Telephone Encounter (Signed)
called pt and scheduled lab for 05-03

## 2011-07-21 ENCOUNTER — Telehealth: Payer: Self-pay | Admitting: *Deleted

## 2011-07-21 ENCOUNTER — Other Ambulatory Visit (HOSPITAL_BASED_OUTPATIENT_CLINIC_OR_DEPARTMENT_OTHER): Payer: Medicare Other

## 2011-07-21 ENCOUNTER — Other Ambulatory Visit: Payer: Self-pay | Admitting: Lab

## 2011-07-21 DIAGNOSIS — Z7901 Long term (current) use of anticoagulants: Secondary | ICD-10-CM

## 2011-07-21 DIAGNOSIS — I82409 Acute embolism and thrombosis of unspecified deep veins of unspecified lower extremity: Secondary | ICD-10-CM

## 2011-07-21 LAB — PROTIME-INR
INR: 1.8 — ABNORMAL LOW (ref 2.00–3.50)
Protime: 21.6 Seconds — ABNORMAL HIGH (ref 10.6–13.4)

## 2011-07-21 NOTE — Telephone Encounter (Signed)
Left VM to continue Coumadin 10 mg daily and recheck in 2 weeks.

## 2011-07-24 ENCOUNTER — Telehealth: Payer: Self-pay | Admitting: Oncology

## 2011-07-24 NOTE — Telephone Encounter (Signed)
called pts home lmovm with appt on 05/17 and to rtn call to confirm appt

## 2011-07-25 ENCOUNTER — Telehealth: Payer: Self-pay | Admitting: Oncology

## 2011-07-25 NOTE — Telephone Encounter (Signed)
pt rtn call and confirm appt on 05/17

## 2011-08-03 IMAGING — US US SOFT TISSUE HEAD/NECK
1 series · 14 of 25 positions shown · non-contrast
Comparison: Ultrasound 11/27/2005.

CLINICAL DATA: Thyroid nodule.

THYROID ULTRASOUND
TECHNIQUE: Ultrasound examination of the thyroid gland and
adjacent soft tissues was performed.

[Series 1: us soft tissue head/neck · 0.08mm/px · 14 of 43 slices shown]
[im 1/43]
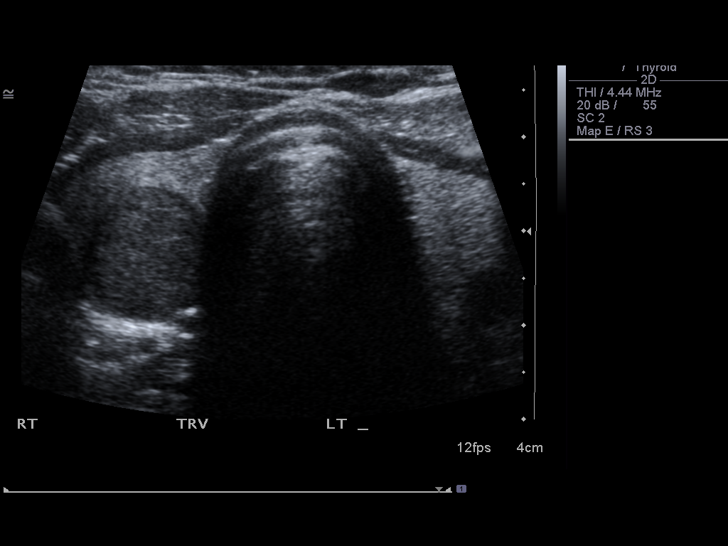
[im 4/43]
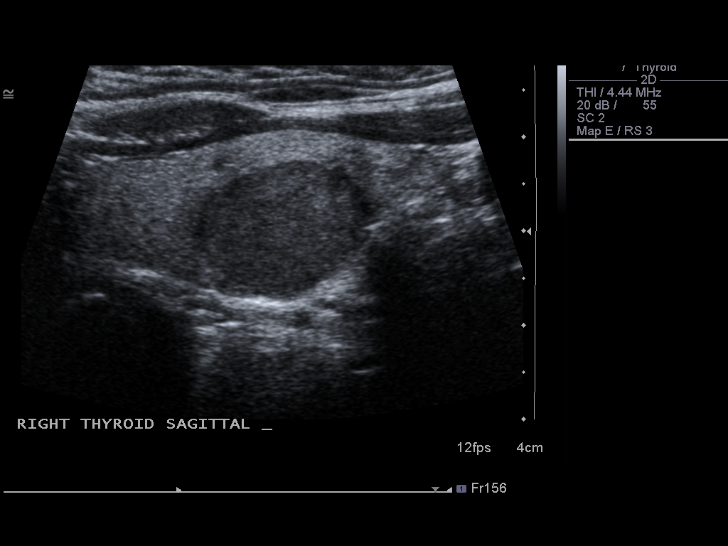
[im 8/43]
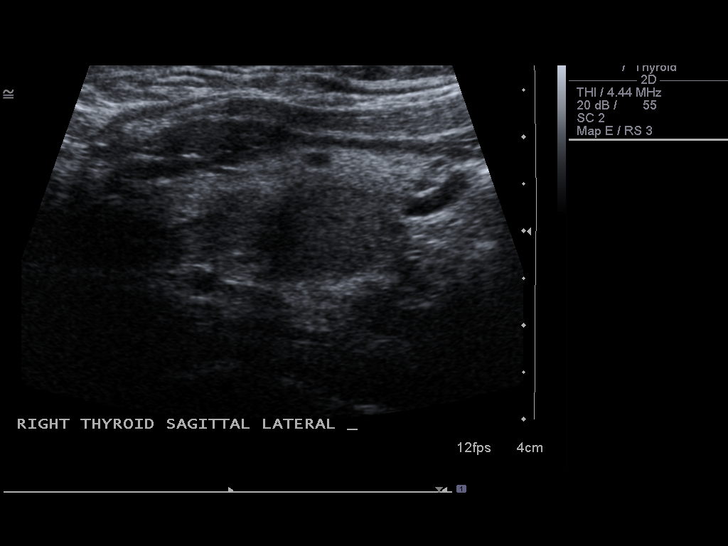
[im 11/43]
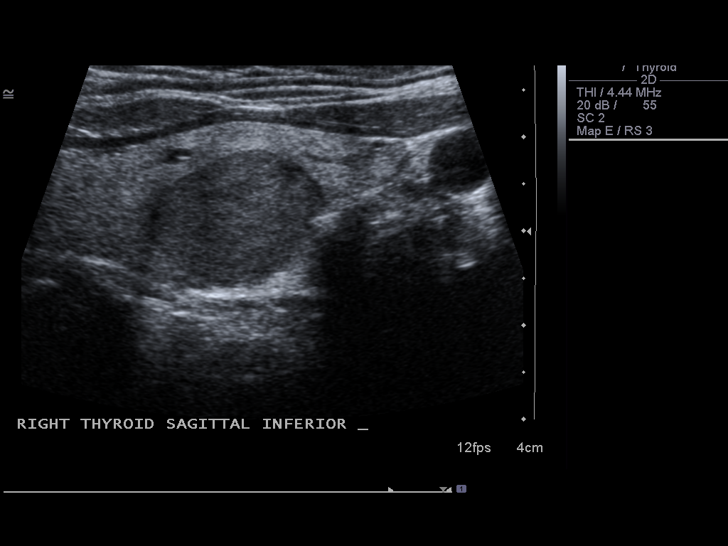
[im 15/43]
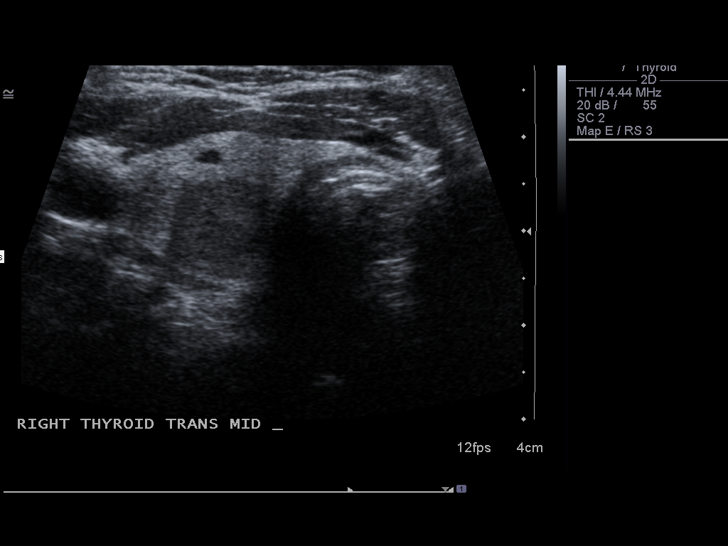
[im 16/43]
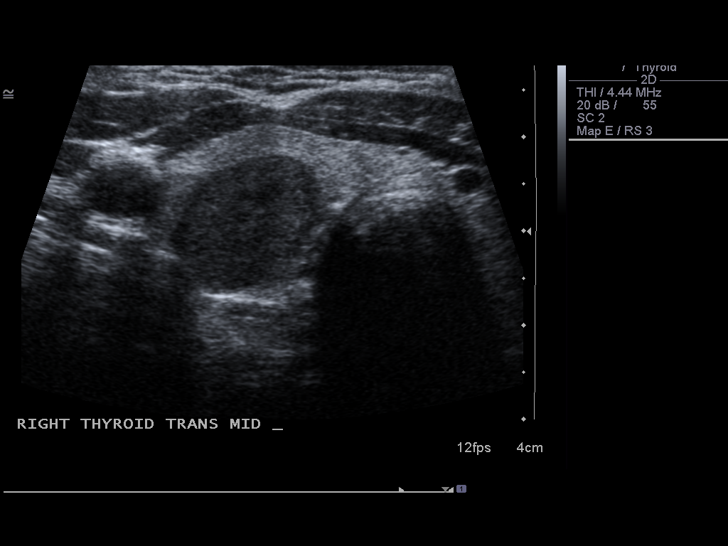
[im 20/43]
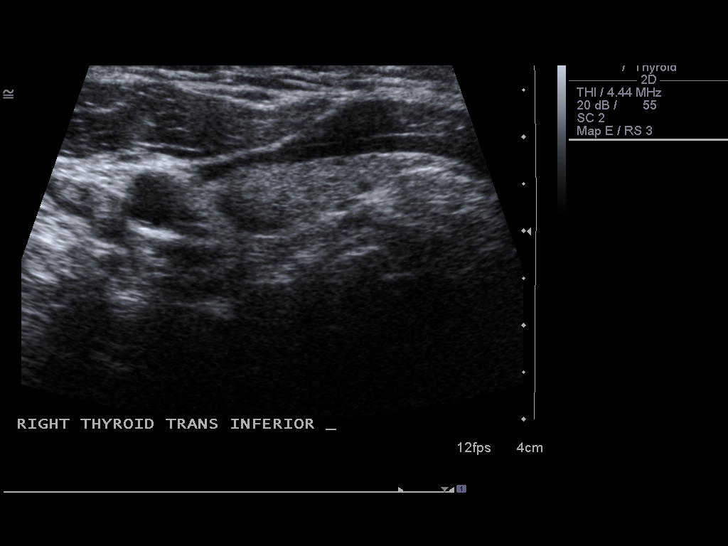
[im 23/43]
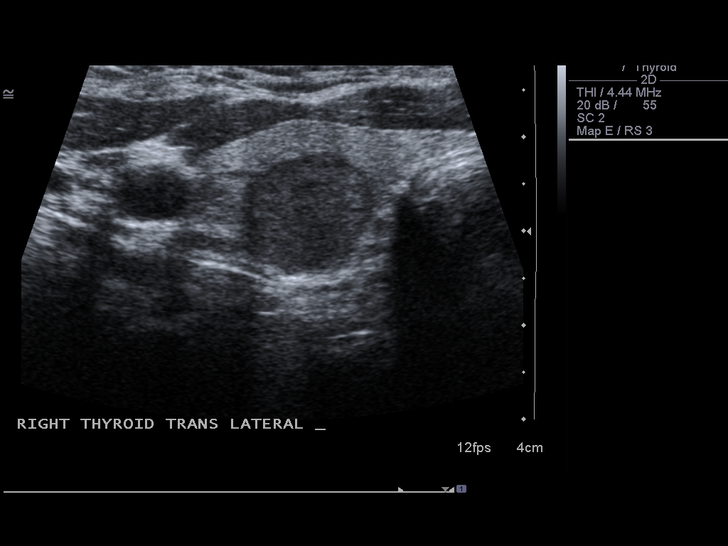
[im 27/43]
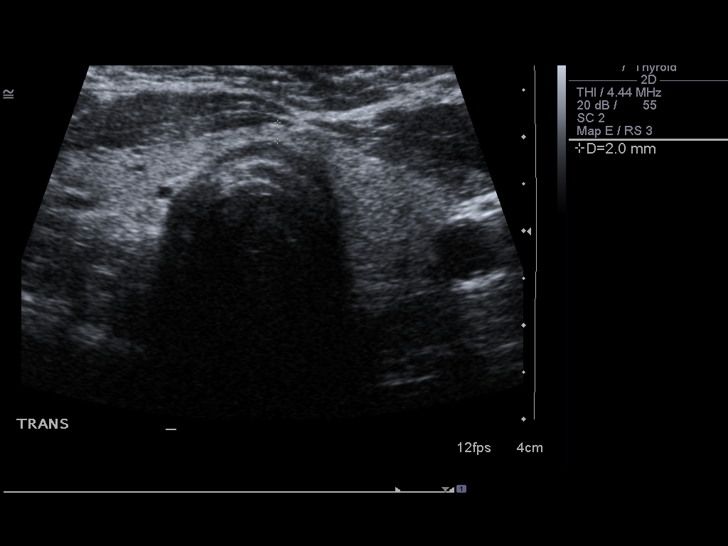
[im 29/43]
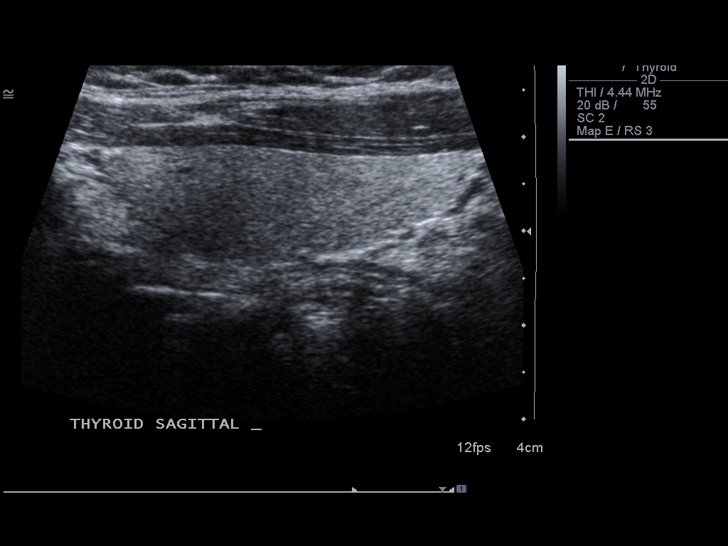
[im 32/43]
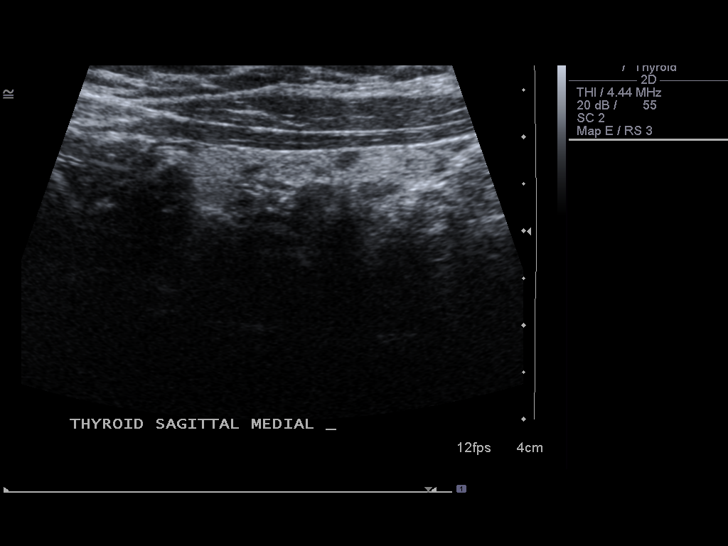
[im 36/43]
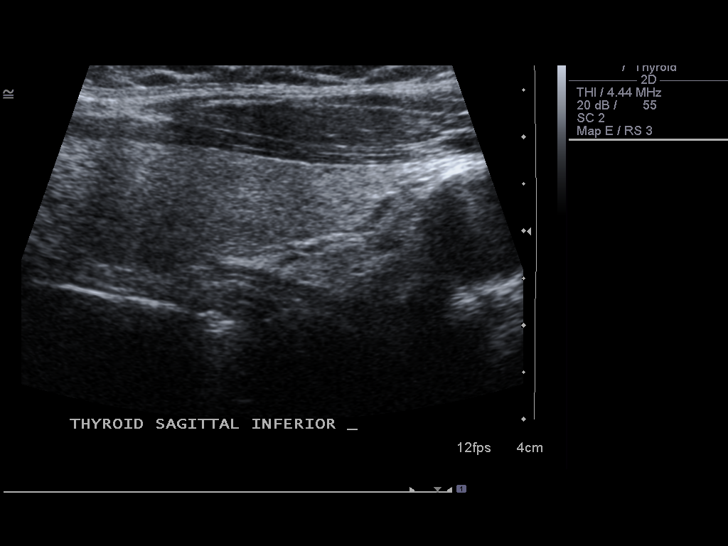
[im 39/43]
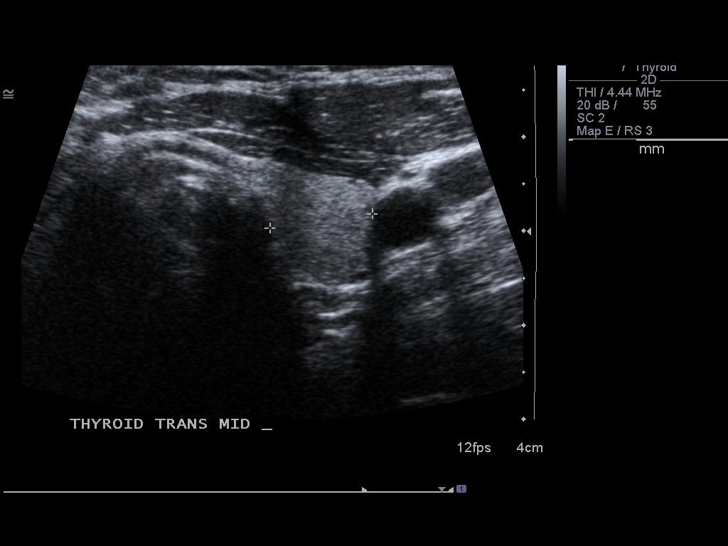
[im 43/43]
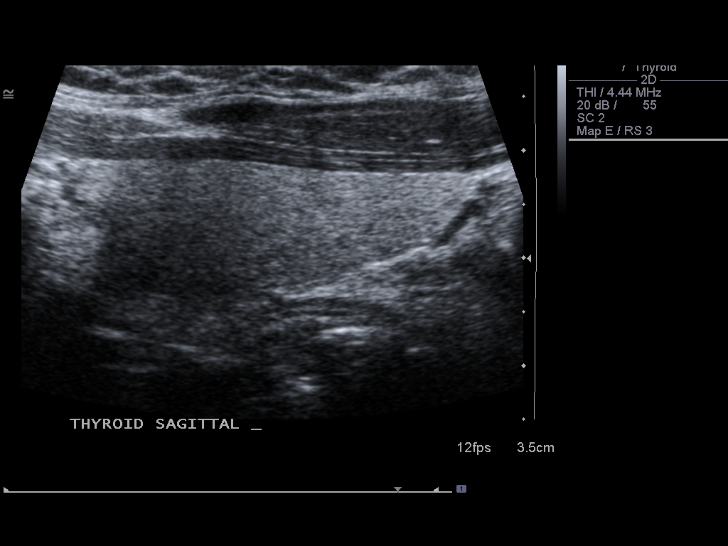

[14 of 25 positions shown; findings below may reference images not displayed]

FINDINGS: The right lobe of the thyroid measures 4.6 x 1.7 x
cm.  There is a dominant solid mass in the right lower lobe
measuring 2.2 x 1.4 1.5 cm.  This is solid and homogeneous in
echogenicity.  The lesion shows increased vascularity on Doppler.
This nodule has increased in size since the prior ultrasound and is
therefore concerning for neoplasm.  Adjacent to this there is a
second hyperechoic solid nodule measuring 7 x 7 x 6 mm.

The left lobe measures 4.0 x 1.2 x 1.1 cm.  No nodule or cyst is
seen on the left.  The left lobe is homogeneous.  The isthmus is
normal in size.
IMPRESSION: Solid dominant mass in the right lobe of the thyroid measuring
x 1.4 x 0.5 cm.  This has  increased in size since the prior study
and is suspicious for neoplasm.  Biopsy is suggested.

## 2011-08-03 IMAGING — US US BIOPSY
1 series · 14 of 15 positions shown · non-contrast
Comparison: none

CLINICAL DATA: Right thyroid nodule

ULTRASOUND-GUIDED NEEDLE ASPIRATE BIOPSY, RIGHT LOBE OF THYROID
The above procedure was discussed with the patient and written
informed consent was obtained.

[Series 1: us biopsy · 0.08mm/px · 14 of 15 slices shown]
[im 1/15]
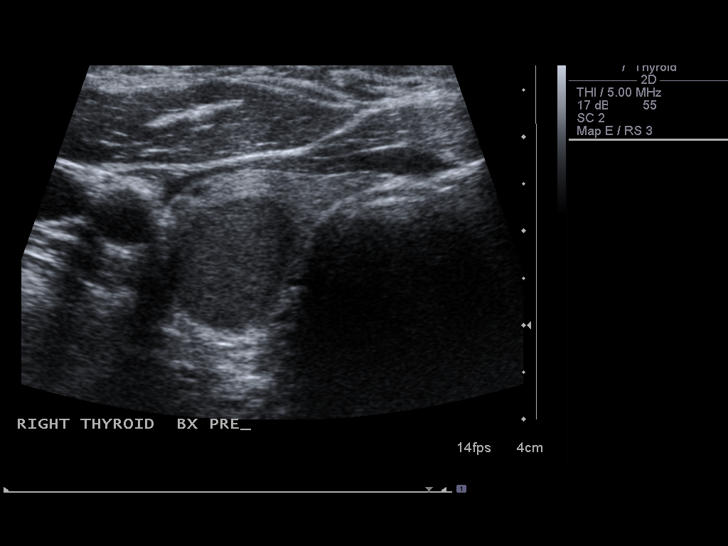
[im 2/15]
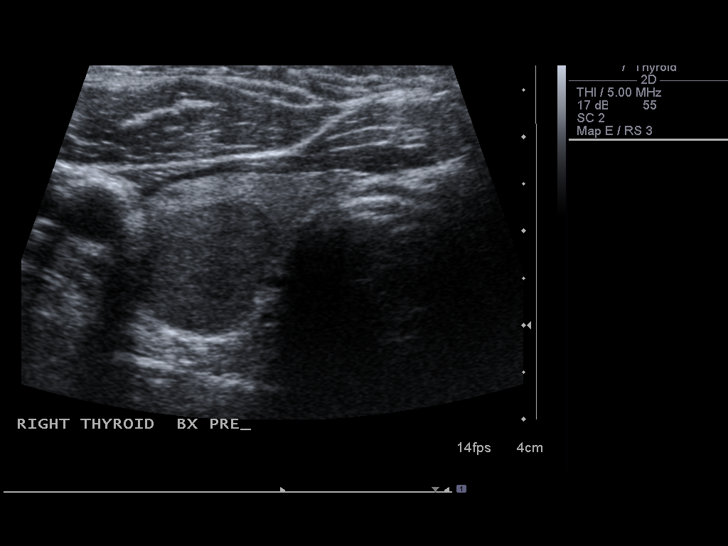
[im 3/15]
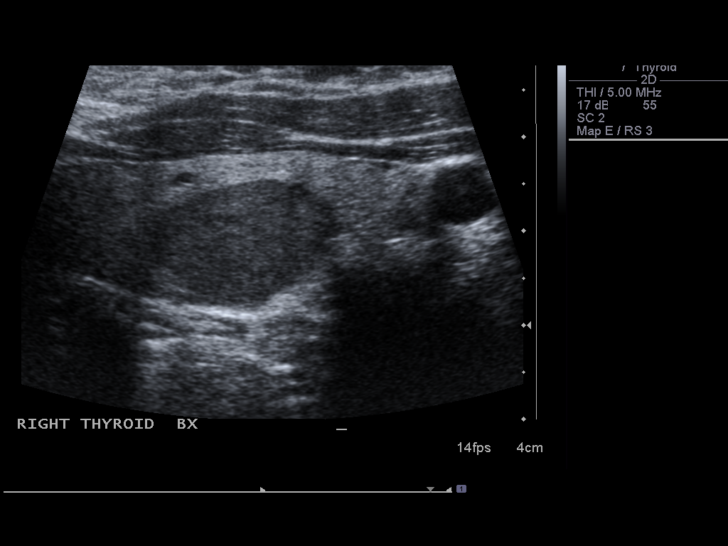
[im 4/15]
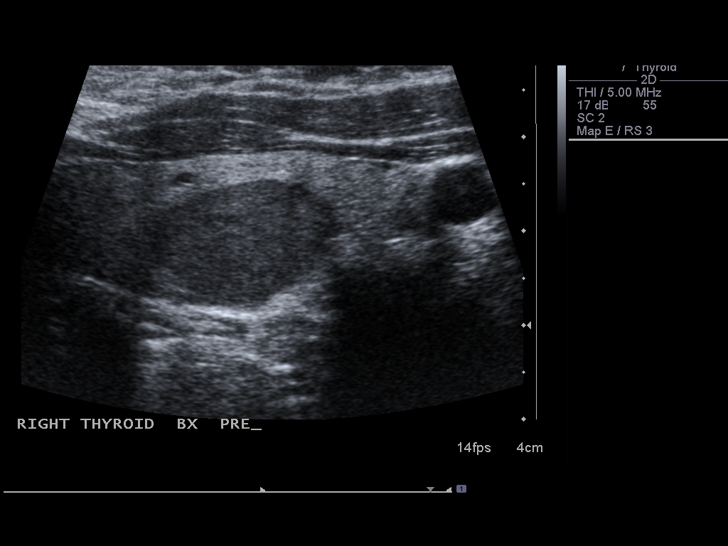
[im 5/15]
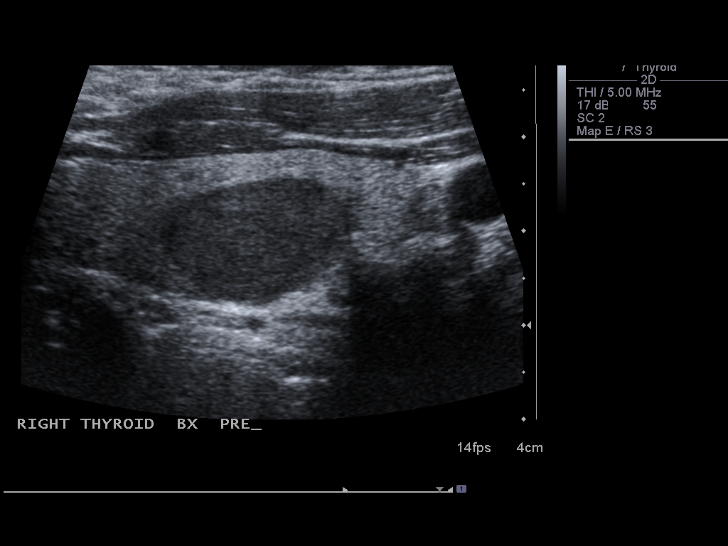
[im 6/15]
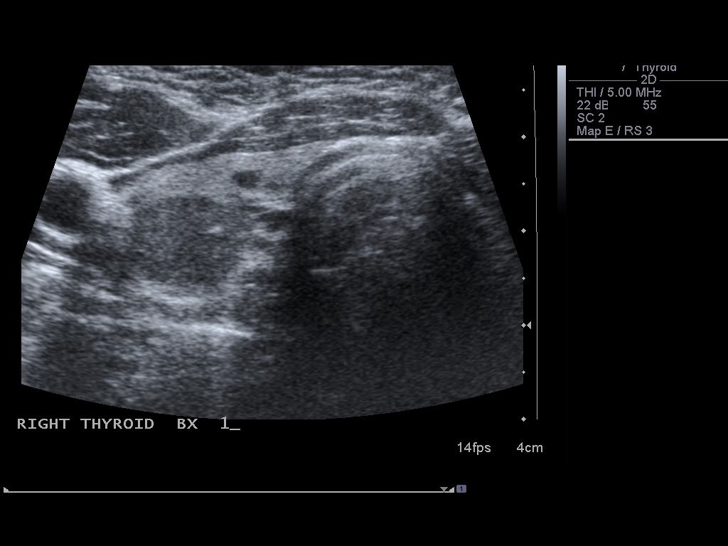
[im 7/15]
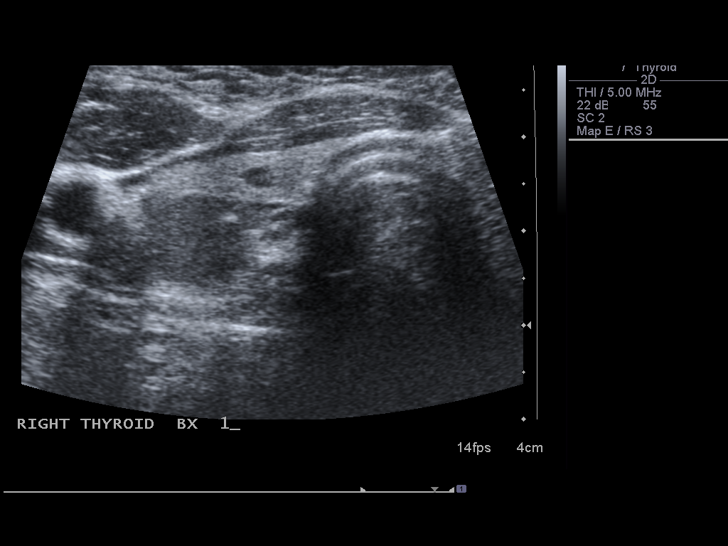
[im 9/15]
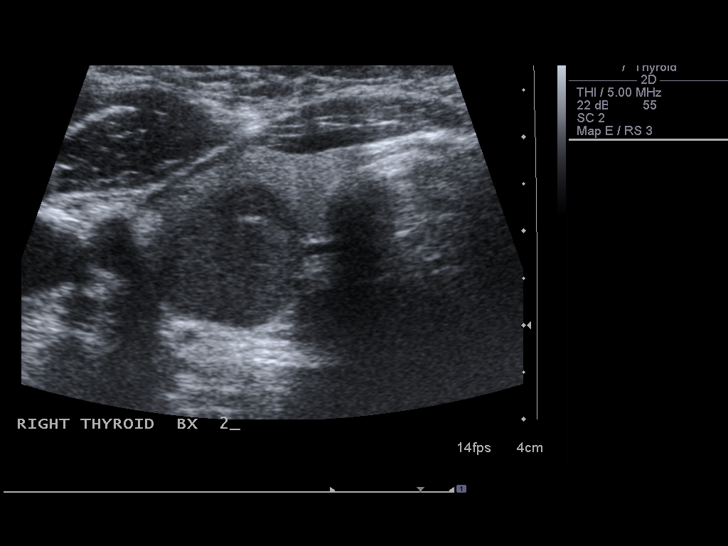
[im 10/15]
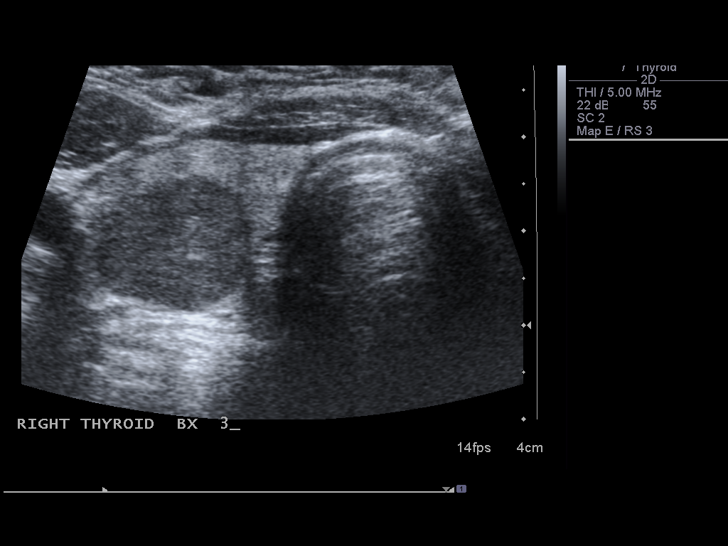
[im 11/15]
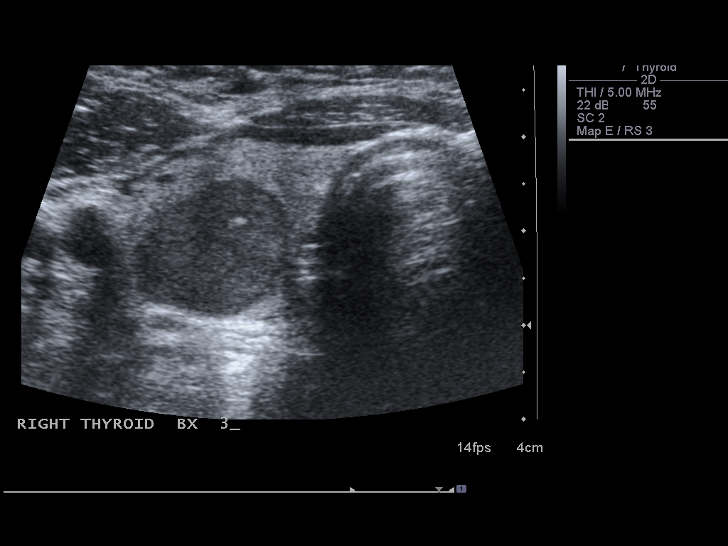
[im 12/15]
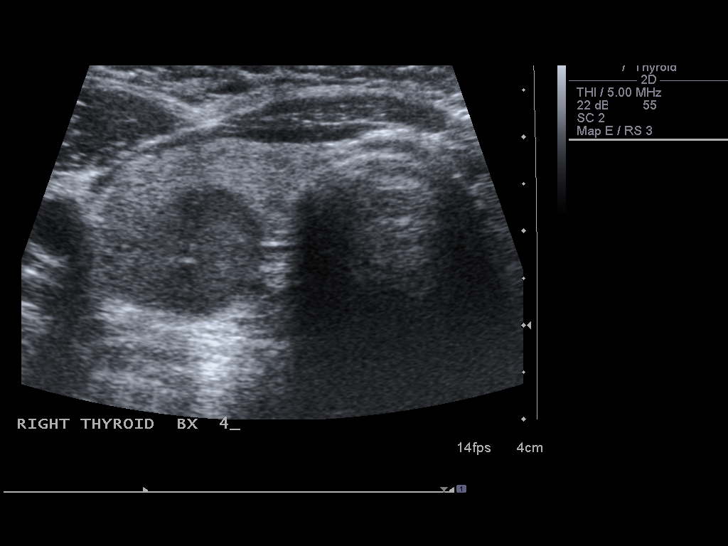
[im 13/15]
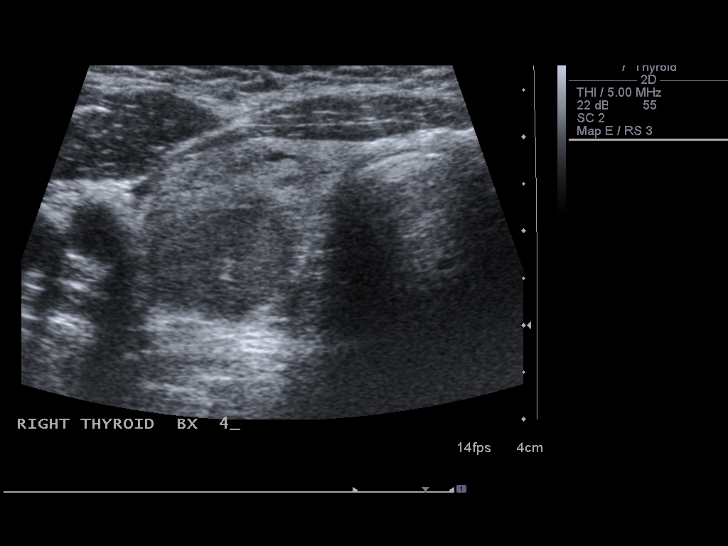
[im 14/15]
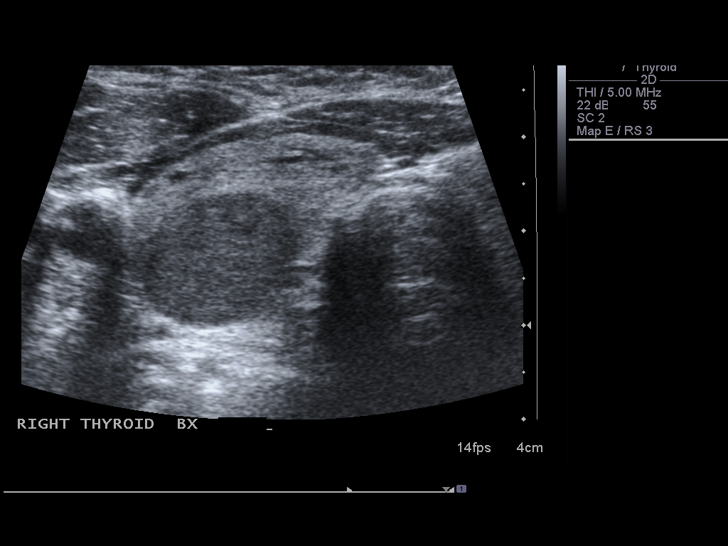
[im 15/15]
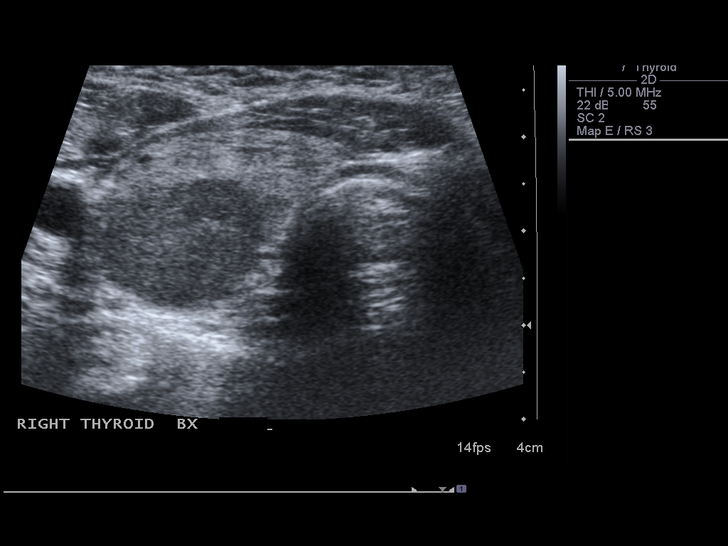

[14 of 15 positions shown; findings below may reference images not displayed]

FINDINGS: Ultrasound was performed to localize and mark an adequate
site for the biopsy.  The patient was then prepped and draped in a
normal sterile fashion.  Local anesthesia was provided with 1%
lidocaine.  Using direct ultrasound guidance, 4 passes were made
using 25g needles into the nodule within the right lobe of the
thyroid.  Ultrasound was used to confirm needle placements on all
occasions.  Specimens were sent to Pathology for analysis.  Post
procedural imaging demonstrated no hematoma or immediate
complication.  The patient tolerated the procedure well.
IMPRESSION: Successful ultrasound guided biopsy of right thyroid nodule.
Pathology pending.

Read by: Onacram, Don Lolito.-NURAISHA

## 2011-08-04 ENCOUNTER — Other Ambulatory Visit: Payer: Self-pay | Admitting: Lab

## 2011-08-11 ENCOUNTER — Telehealth: Payer: Self-pay | Admitting: Oncology

## 2011-08-11 NOTE — Telephone Encounter (Signed)
called pt and informed her that her appt on 06/24 was moved to 06/25

## 2011-08-15 ENCOUNTER — Other Ambulatory Visit (HOSPITAL_BASED_OUTPATIENT_CLINIC_OR_DEPARTMENT_OTHER): Payer: Medicare Other | Admitting: Lab

## 2011-08-15 DIAGNOSIS — I82409 Acute embolism and thrombosis of unspecified deep veins of unspecified lower extremity: Secondary | ICD-10-CM

## 2011-08-15 LAB — PROTIME-INR
INR: 3.2 (ref 2.00–3.50)
Protime: 38.4 Seconds — ABNORMAL HIGH (ref 10.6–13.4)

## 2011-08-18 ENCOUNTER — Telehealth: Payer: Self-pay

## 2011-08-18 ENCOUNTER — Telehealth: Payer: Self-pay | Admitting: Oncology

## 2011-08-18 DIAGNOSIS — I82409 Acute embolism and thrombosis of unspecified deep veins of unspecified lower extremity: Secondary | ICD-10-CM

## 2011-08-18 NOTE — Telephone Encounter (Signed)
Pt notified by phone per Dr Truett Perna - continue same dose of Coumadin.   Pt verifies she is taking Coumadin 10mg  daily.    Aware to expect a call from scheduling for recheck in 2 weeks. dph

## 2011-08-18 NOTE — Telephone Encounter (Signed)
S/w the pt and she is aware of her lab appt on 08/29/2011

## 2011-08-22 ENCOUNTER — Other Ambulatory Visit: Payer: Self-pay | Admitting: Internal Medicine

## 2011-08-22 DIAGNOSIS — Z1231 Encounter for screening mammogram for malignant neoplasm of breast: Secondary | ICD-10-CM

## 2011-08-29 ENCOUNTER — Other Ambulatory Visit: Payer: Self-pay | Admitting: Lab

## 2011-09-11 ENCOUNTER — Other Ambulatory Visit: Payer: Self-pay | Admitting: Lab

## 2011-09-11 ENCOUNTER — Ambulatory Visit: Payer: Self-pay | Admitting: Oncology

## 2011-09-12 ENCOUNTER — Telehealth: Payer: Self-pay | Admitting: Oncology

## 2011-09-12 ENCOUNTER — Ambulatory Visit: Payer: Medicare Other | Admitting: Lab

## 2011-09-12 ENCOUNTER — Ambulatory Visit (HOSPITAL_BASED_OUTPATIENT_CLINIC_OR_DEPARTMENT_OTHER): Payer: Medicare Other | Admitting: Oncology

## 2011-09-12 ENCOUNTER — Other Ambulatory Visit (HOSPITAL_BASED_OUTPATIENT_CLINIC_OR_DEPARTMENT_OTHER): Payer: Medicare Other | Admitting: Lab

## 2011-09-12 VITALS — BP 116/71 | HR 81 | Temp 97.2°F | Ht 66.0 in | Wt 278.2 lb

## 2011-09-12 DIAGNOSIS — I82409 Acute embolism and thrombosis of unspecified deep veins of unspecified lower extremity: Secondary | ICD-10-CM

## 2011-09-12 DIAGNOSIS — D649 Anemia, unspecified: Secondary | ICD-10-CM

## 2011-09-12 DIAGNOSIS — J449 Chronic obstructive pulmonary disease, unspecified: Secondary | ICD-10-CM

## 2011-09-12 DIAGNOSIS — M549 Dorsalgia, unspecified: Secondary | ICD-10-CM

## 2011-09-12 LAB — CBC WITH DIFFERENTIAL/PLATELET
BASO%: 0.2 % (ref 0.0–2.0)
Basophils Absolute: 0 10*3/uL (ref 0.0–0.1)
EOS%: 3.4 % (ref 0.0–7.0)
Eosinophils Absolute: 0.2 10*3/uL (ref 0.0–0.5)
HCT: 33.1 % — ABNORMAL LOW (ref 34.8–46.6)
HGB: 10.8 g/dL — ABNORMAL LOW (ref 11.6–15.9)
LYMPH%: 38.2 % (ref 14.0–49.7)
MCH: 26.2 pg (ref 25.1–34.0)
MCHC: 32.6 g/dL (ref 31.5–36.0)
MCV: 80.1 fL (ref 79.5–101.0)
MONO#: 0.3 10*3/uL (ref 0.1–0.9)
MONO%: 6.8 % (ref 0.0–14.0)
NEUT#: 2.6 10*3/uL (ref 1.5–6.5)
NEUT%: 51.4 % (ref 38.4–76.8)
Platelets: 354 10*3/uL (ref 145–400)
RBC: 4.13 10*6/uL (ref 3.70–5.45)
RDW: 17.8 % — ABNORMAL HIGH (ref 11.2–14.5)
WBC: 5 10*3/uL (ref 3.9–10.3)
lymph#: 1.9 10*3/uL (ref 0.9–3.3)

## 2011-09-12 LAB — PROTIME-INR
INR: 1.7 — ABNORMAL LOW (ref 2.00–3.50)
Protime: 20.4 Seconds — ABNORMAL HIGH (ref 10.6–13.4)

## 2011-09-12 NOTE — Telephone Encounter (Signed)
Gv pt appt for [id number]

## 2011-09-12 NOTE — Progress Notes (Signed)
   [facility]    OFFICE PROGRESS NOTE   INTERVAL HISTORY:   She returns as scheduled. She continues Coumadin. Mr. Verstraete reports recent vaginal spotting. No other bleeding. She has developed pain at the right mid to lower back. She saw Dr. Mikal Plane and has been scheduled for an MRI. She has been evaluated by Dr. Tamela Oddi for the vaginal spotting.  Objective:  Vital signs in last 24 hours:  Blood pressure 116/71, pulse 81, temperature 97.2 F (36.2 C), temperature source Oral, height 5\' 6"  (1.676 m), weight 278 lb 3.2 oz (126.191 kg).    HEENT: Neck without mass Lymphatics: No cervical, supraclavicular, axillary, or inguinal nodes Resp: Lungs clear bilaterally Cardio: Regular rate and rhythm GI: No hepatosplenomegaly Vascular: No leg edema Musculoskeletal: Tenderness at the right low paraspinous area, no mass    Lab Results:  Lab Results  Component Value Date   WBC 5.0 09/12/2011   HGB 10.8* 09/12/2011   HCT 33.1* 09/12/2011   MCV 80.1 09/12/2011   PLT 354 09/12/2011    PT/INR 1.7  Medications: I have reviewed the patient's current medications.  Assessment/Plan: 1. History of bilateral lower extremity deep vein thrombosis. She continues Coumadin 10 mg daily. She will return for a PT in one month. 2. History of a positive lupus anticoagulant. 3. History of a positive beta-2-glycoprotein IgA antibody. 4. Chronic bilateral leg pain - negative bilateral Doppler January 2010. 5. History of hypertension. 6. COPD. 7. Status post hysterectomy. 8. Remote history of cocaine abuse. 9. History of an adrenal lesion on a CT scan in 2008 - status post a repeat CT on June 16, 2009, showing slow enlargement of the right adrenal nodule since 2006, most consistent with an adenoma. 10. History of rectal and vaginal bleeding in May 2009. Recent vaginal spotting-she plans to followup with gynecology 11. Status post carpal tunnel surgery. 12. 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 Right knee replacement September 2011. 13. Multiple orthopedic conditions. 14. Placement of IVC filter preoperatively before knee replacement. The IVC filter was retrieved 03/01/2011. 15. Anemia-stable, I doubt this is related to the vaginal spotting. We will check a ferritin when she returns next month. 16. Back pain-she will followup with Dr. Mikal Plane.  Disposition:  She will continue Coumadin at current dose. She will return for a PT/INR and CBC in one month.   Thornton Papas, MD  09/12/2011  2:12 PM

## 2011-09-14 ENCOUNTER — Ambulatory Visit: Payer: Self-pay | Admitting: Oncology

## 2011-09-15 ENCOUNTER — Other Ambulatory Visit: Payer: Self-pay | Admitting: *Deleted

## 2011-09-18 ENCOUNTER — Other Ambulatory Visit: Payer: Self-pay | Admitting: Neurosurgery

## 2011-09-18 DIAGNOSIS — M545 Low back pain, unspecified: Secondary | ICD-10-CM

## 2011-09-19 ENCOUNTER — Ambulatory Visit
Admission: RE | Admit: 2011-09-19 | Discharge: 2011-09-19 | Disposition: A | Discharge status: Home / Self Care | Payer: Medicare Other | Source: Ambulatory Visit | Attending: Neurosurgery | Admitting: Neurosurgery

## 2011-09-19 DIAGNOSIS — M545 Low back pain, unspecified: Secondary | ICD-10-CM

## 2011-10-02 ENCOUNTER — Telehealth: Payer: Self-pay | Admitting: *Deleted

## 2011-10-02 NOTE — Telephone Encounter (Signed)
Has been referred there for pain management and needs facet block. Asking if OK for her to be off coumadin for 8 days prior to procedure (could be off 5 days at least). Please call or fax response to [phone].

## 2011-10-11 ENCOUNTER — Other Ambulatory Visit: Payer: Self-pay | Admitting: Lab

## 2011-10-12 ENCOUNTER — Telehealth: Payer: Self-pay | Admitting: Oncology

## 2011-10-12 NOTE — Telephone Encounter (Signed)
Pt lmonvm to cx 7/24 appt and r/s. lmonvm for pt re new d/t for lb 7/26 @ 2 pm.

## 2011-10-13 ENCOUNTER — Other Ambulatory Visit: Payer: Medicare Other | Admitting: Lab

## 2011-10-13 ENCOUNTER — Telehealth: Payer: Self-pay | Admitting: Oncology

## 2011-10-13 NOTE — Telephone Encounter (Signed)
Returned pt's call w/new appt for in the AM 7/29 @ 9:45 am. Per pt she cannot take the PM heat. lmonvm for pt re new d/t.

## 2011-10-16 ENCOUNTER — Telehealth: Payer: Self-pay | Admitting: *Deleted

## 2011-10-16 ENCOUNTER — Other Ambulatory Visit (HOSPITAL_BASED_OUTPATIENT_CLINIC_OR_DEPARTMENT_OTHER): Payer: Medicare Other | Admitting: Lab

## 2011-10-16 DIAGNOSIS — Z7901 Long term (current) use of anticoagulants: Secondary | ICD-10-CM

## 2011-10-16 DIAGNOSIS — I82409 Acute embolism and thrombosis of unspecified deep veins of unspecified lower extremity: Secondary | ICD-10-CM

## 2011-10-16 DIAGNOSIS — D649 Anemia, unspecified: Secondary | ICD-10-CM

## 2011-10-16 LAB — CBC WITH DIFFERENTIAL/PLATELET
BASO%: 0.5 % (ref 0.0–2.0)
Basophils Absolute: 0 10*3/uL (ref 0.0–0.1)
EOS%: 2.1 % (ref 0.0–7.0)
Eosinophils Absolute: 0.1 10*3/uL (ref 0.0–0.5)
HCT: 34.6 % — ABNORMAL LOW (ref 34.8–46.6)
HGB: 11.4 g/dL — ABNORMAL LOW (ref 11.6–15.9)
LYMPH%: 40.5 % (ref 14.0–49.7)
MCH: 26.9 pg (ref 25.1–34.0)
MCHC: 32.9 g/dL (ref 31.5–36.0)
MCV: 82 fL (ref 79.5–101.0)
MONO#: 0.3 10*3/uL (ref 0.1–0.9)
MONO%: 7.9 % (ref 0.0–14.0)
NEUT#: 2.1 10*3/uL (ref 1.5–6.5)
NEUT%: 49 % (ref 38.4–76.8)
Platelets: 322 10*3/uL (ref 145–400)
RBC: 4.22 10*6/uL (ref 3.70–5.45)
RDW: 18.4 % — ABNORMAL HIGH (ref 11.2–14.5)
WBC: 4.3 10*3/uL (ref 3.9–10.3)
lymph#: 1.8 10*3/uL (ref 0.9–3.3)

## 2011-10-16 LAB — PROTIME-INR
INR: 1.2 — ABNORMAL LOW (ref 2.00–3.50)
Protime: 14.4 Seconds — ABNORMAL HIGH (ref 10.6–13.4)

## 2011-10-16 LAB — FERRITIN: Ferritin: 28 ng/mL (ref 10–291)

## 2011-10-16 NOTE — Telephone Encounter (Signed)
Left VM for patient to call back [id number]. INR low--? Is she taking it? Is she out, missed doses? Also need to discuss the epidural injection Dr. Ollen Bowl plans for 11/02/11 at 0900. Are asking for OK for her to be off coumadin X 5 days prior. Call [phone] or fax [phone] (ask for Lajuan Lines).

## 2011-10-17 ENCOUNTER — Telehealth: Payer: Self-pay | Admitting: *Deleted

## 2011-10-17 DIAGNOSIS — I82409 Acute embolism and thrombosis of unspecified deep veins of unspecified lower extremity: Secondary | ICD-10-CM

## 2011-10-17 NOTE — Telephone Encounter (Signed)
She confirms she is on Coumadin 10 mg daily and has missed no doses. She is on no new meds or vitamins. Is taking Percocet 5/325 #2 twice daily for her back pain. Per Dr. Truett Perna, she is high risk for blood clot and would be OK to come off Coumadin for 5 days prior to epidural injection, but would require Lovenox bridge. Asked patient if she felt she needed the injection enough to go through this anticoagulation and she said yes, but she wants to get injections done at [facility]. Reminded her that they can't be given on Sundays here (closed).  Per Dr. Truett Perna: Start coumadin 15 mg on T-Th-Sat and 10 mg all other days. Recheck on 8/7 at 0800. Left VM for Lajuan Lines with Dr. Ollen Bowl office regarding OK to come off Coumadin for 5 days prior to epidural,but due to high risk for blood clot, she would need Lovenox bridge. Requested they also discuss this with patient to confirm she wishes to pursue this. Suggested Dr. Ollen Bowl call Dr. Truett Perna to coordinate this.

## 2011-10-18 ENCOUNTER — Other Ambulatory Visit: Payer: Self-pay | Admitting: *Deleted

## 2011-10-18 DIAGNOSIS — Z86718 Personal history of other venous thrombosis and embolism: Secondary | ICD-10-CM

## 2011-10-18 NOTE — Telephone Encounter (Signed)
Received call from Dr. Ollen Bowl office that pt's procedure is scheduled for 11/02/11 at 0915.  Left message on voicemail for pt to call office with pharmacy information. Lovenox dosing will be every 12 hours, will need to be done at home.

## 2011-10-18 NOTE — Telephone Encounter (Signed)
Left message on voicemail for pt to call office with procedure date.

## 2011-10-18 NOTE — Telephone Encounter (Signed)
Coumadin/ Lovenox bridge instructions for epidural injection: Stop Coumadin 5 days pre-op. Begin Lovenox 3 days pre-op. (Take every 12 hours for 2 days) Hold Lovenox day prior to procedure.  Resume Lovenox and Coumadin day after procedure.  Continue Lovenox until PT/INR checked in office 5 days after procedure. Pt will need lab in this office day prior to procedure and 5 days post procedure.  Lovenox dosing will be: 120 mg subQ every 12 hours. (Pt will need prescription for self injection.)

## 2011-10-20 ENCOUNTER — Other Ambulatory Visit: Payer: Self-pay | Admitting: *Deleted

## 2011-10-20 ENCOUNTER — Telehealth: Payer: Self-pay | Admitting: *Deleted

## 2011-10-20 DIAGNOSIS — I82409 Acute embolism and thrombosis of unspecified deep veins of unspecified lower extremity: Secondary | ICD-10-CM

## 2011-10-20 MED ORDER — ENOXAPARIN SODIUM 120 MG/0.8ML ~~LOC~~ SOLN: 120.0000 mg | Freq: Two times a day (BID) | SUBCUTANEOUS | Status: DC

## 2011-10-20 NOTE — Telephone Encounter (Signed)
Anticoagulation Directions for Epidural Injection per Dr. Truett Perna:   Procedure 11/02/11  Day 5 Preop: Stop Coumadin Day 3 Preop: Start Lovenox 120 mg sq every 12 hours Day 1 Preop: Come to [facility] for PT/INR check and stop Lovenox Day after Procedure: Resume Lovenox 120 mg every 12 hours and resume Coumadin at 10 mg daily, except 15 mg on T-TH-Sat Continue Lovenox until PT/INR is therapeutic Day 5 Postop: Come to [facility] for PT/INR check--Dr. Truett Perna to provide further directions at that time.  Left VM on home # with these directions and that Lovenox script was called in [facility] as requested. Will also mail hardcopy to her home. Instructed her to call office if she does not get the mailing by Wednesday.  Will forward copy of schedule to neurosurgeon office.

## 2011-10-25 ENCOUNTER — Telehealth: Payer: Self-pay | Admitting: Oncology

## 2011-10-25 ENCOUNTER — Other Ambulatory Visit: Payer: Medicare Other | Admitting: Lab

## 2011-10-25 NOTE — Telephone Encounter (Signed)
lmonvm adviising the pt of her lab appts on 8/14, and 11/07/2011

## 2011-11-01 ENCOUNTER — Encounter: Payer: Self-pay | Admitting: *Deleted

## 2011-11-01 ENCOUNTER — Other Ambulatory Visit (HOSPITAL_BASED_OUTPATIENT_CLINIC_OR_DEPARTMENT_OTHER): Payer: Medicare Other | Admitting: Lab

## 2011-11-01 DIAGNOSIS — Z86718 Personal history of other venous thrombosis and embolism: Secondary | ICD-10-CM

## 2011-11-01 DIAGNOSIS — I82409 Acute embolism and thrombosis of unspecified deep veins of unspecified lower extremity: Secondary | ICD-10-CM

## 2011-11-01 LAB — PROTIME-INR
INR: 1.2 — ABNORMAL LOW (ref 2.00–3.50)
Protime: 14.4 Seconds — ABNORMAL HIGH (ref 10.6–13.4)

## 2011-11-04 ENCOUNTER — Inpatient Hospital Stay (HOSPITAL_COMMUNITY)
Admission: EM | Admit: 2011-11-04 | Discharge: 2011-11-07 | DRG: 552 | Disposition: A | Discharge status: Home / Self Care | Payer: Medicare Other | Attending: Internal Medicine | Admitting: Internal Medicine

## 2011-11-04 ENCOUNTER — Encounter (HOSPITAL_COMMUNITY): Payer: Self-pay | Admitting: Emergency Medicine

## 2011-11-04 ENCOUNTER — Emergency Department (HOSPITAL_COMMUNITY)
Admission: EM | Admit: 2011-11-04 | Discharge: 2011-11-04 | Disposition: A | Discharge status: Home / Self Care | Payer: Medicare Other | Attending: Emergency Medicine | Admitting: Emergency Medicine

## 2011-11-04 ENCOUNTER — Emergency Department (HOSPITAL_COMMUNITY): Payer: Medicare Other

## 2011-11-04 DIAGNOSIS — I825Z9 Chronic embolism and thrombosis of unspecified deep veins of unspecified distal lower extremity: Secondary | ICD-10-CM | POA: Diagnosis present

## 2011-11-04 DIAGNOSIS — M76899 Other specified enthesopathies of unspecified lower limb, excluding foot: Secondary | ICD-10-CM | POA: Diagnosis present

## 2011-11-04 DIAGNOSIS — R109 Unspecified abdominal pain: Secondary | ICD-10-CM | POA: Diagnosis present

## 2011-11-04 DIAGNOSIS — M5430 Sciatica, unspecified side: Secondary | ICD-10-CM | POA: Diagnosis present

## 2011-11-04 DIAGNOSIS — E669 Obesity, unspecified: Secondary | ICD-10-CM | POA: Diagnosis present

## 2011-11-04 DIAGNOSIS — Z8739 Personal history of other diseases of the musculoskeletal system and connective tissue: Secondary | ICD-10-CM | POA: Insufficient documentation

## 2011-11-04 DIAGNOSIS — J449 Chronic obstructive pulmonary disease, unspecified: Secondary | ICD-10-CM | POA: Diagnosis present

## 2011-11-04 DIAGNOSIS — Z6841 Body Mass Index (BMI) 40.0 and over, adult: Secondary | ICD-10-CM

## 2011-11-04 DIAGNOSIS — M5137 Other intervertebral disc degeneration, lumbosacral region: Principal | ICD-10-CM | POA: Diagnosis present

## 2011-11-04 DIAGNOSIS — J4489 Other specified chronic obstructive pulmonary disease: Secondary | ICD-10-CM | POA: Diagnosis present

## 2011-11-04 DIAGNOSIS — E119 Type 2 diabetes mellitus without complications: Secondary | ICD-10-CM | POA: Diagnosis present

## 2011-11-04 DIAGNOSIS — Z7901 Long term (current) use of anticoagulants: Secondary | ICD-10-CM

## 2011-11-04 DIAGNOSIS — Z79899 Other long term (current) drug therapy: Secondary | ICD-10-CM | POA: Insufficient documentation

## 2011-11-04 DIAGNOSIS — E876 Hypokalemia: Secondary | ICD-10-CM | POA: Diagnosis present

## 2011-11-04 DIAGNOSIS — R350 Frequency of micturition: Secondary | ICD-10-CM | POA: Insufficient documentation

## 2011-11-04 DIAGNOSIS — Z86718 Personal history of other venous thrombosis and embolism: Secondary | ICD-10-CM | POA: Insufficient documentation

## 2011-11-04 DIAGNOSIS — I10 Essential (primary) hypertension: Secondary | ICD-10-CM | POA: Insufficient documentation

## 2011-11-04 DIAGNOSIS — M51379 Other intervertebral disc degeneration, lumbosacral region without mention of lumbar back pain or lower extremity pain: Principal | ICD-10-CM | POA: Diagnosis present

## 2011-11-04 DIAGNOSIS — E1169 Type 2 diabetes mellitus with other specified complication: Secondary | ICD-10-CM | POA: Diagnosis present

## 2011-11-04 LAB — CBC WITH DIFFERENTIAL/PLATELET
Basophils Absolute: 0 10*3/uL (ref 0.0–0.1)
Basophils Relative: 0 % (ref 0–1)
Eosinophils Absolute: 0 10*3/uL (ref 0.0–0.7)
Eosinophils Relative: 0 % (ref 0–5)
HCT: 33.4 % — ABNORMAL LOW (ref 36.0–46.0)
Hemoglobin: 11.2 g/dL — ABNORMAL LOW (ref 12.0–15.0)
Lymphocytes Relative: 26 % (ref 12–46)
Lymphs Abs: 2.6 10*3/uL (ref 0.7–4.0)
MCH: 26.5 pg (ref 26.0–34.0)
MCHC: 33.5 g/dL (ref 30.0–36.0)
MCV: 79.1 fL (ref 78.0–100.0)
Monocytes Absolute: 0.8 10*3/uL (ref 0.1–1.0)
Monocytes Relative: 8 % (ref 3–12)
Neutro Abs: 6.5 10*3/uL (ref 1.7–7.7)
Neutrophils Relative %: 65 % (ref 43–77)
Platelets: 358 10*3/uL (ref 150–400)
RBC: 4.22 MIL/uL (ref 3.87–5.11)
RDW: 18.1 % — ABNORMAL HIGH (ref 11.5–15.5)
WBC: 9.9 10*3/uL (ref 4.0–10.5)

## 2011-11-04 LAB — BASIC METABOLIC PANEL
BUN: 14 mg/dL (ref 6–23)
BUN: 13 mg/dL (ref 6–23)
CO2: 24 mEq/L (ref 19–32)
CO2: 26 mEq/L (ref 19–32)
Calcium: 9.1 mg/dL (ref 8.4–10.5)
Calcium: 9.8 mg/dL (ref 8.4–10.5)
Chloride: 97 mEq/L (ref 96–112)
Chloride: 96 mEq/L (ref 96–112)
Creatinine, Ser: 0.62 mg/dL (ref 0.50–1.10)
Creatinine, Ser: 0.66 mg/dL (ref 0.50–1.10)
GFR calc Af Amer: >90 mL/min (ref >90)
GFR calc Af Amer: >90 mL/min (ref >90)
GFR calc non Af Amer: >90 mL/min (ref >90)
GFR calc non Af Amer: >90 mL/min (ref >90)
Glucose, Bld: 145 mg/dL — ABNORMAL HIGH (ref 70–99)
Glucose, Bld: 139 mg/dL — ABNORMAL HIGH (ref 70–99)
Potassium: 3.5 mEq/L (ref 3.5–5.1)
Potassium: 3.4 mEq/L — ABNORMAL LOW (ref 3.5–5.1)
Sodium: 134 mEq/L — ABNORMAL LOW (ref 135–145)
Sodium: 134 mEq/L — ABNORMAL LOW (ref 135–145)

## 2011-11-04 LAB — URINALYSIS, ROUTINE W REFLEX MICROSCOPIC
Bilirubin Urine: NEGATIVE
Glucose, UA: NEGATIVE mg/dL
Hgb urine dipstick: NEGATIVE
Ketones, ur: NEGATIVE mg/dL
Leukocytes, UA: NEGATIVE
Nitrite: NEGATIVE
Protein, ur: NEGATIVE mg/dL
Specific Gravity, Urine: 1.013 (ref 1.005–1.030)
Urobilinogen, UA: 0.2 mg/dL (ref 0.0–1.0)
pH: 6.5 (ref 5.0–8.0)

## 2011-11-04 LAB — LACTIC ACID, PLASMA: Lactic Acid, Venous: 2 mmol/L (ref 0.5–2.2)

## 2011-11-04 LAB — DIGOXIN LEVEL: Digoxin Level: <0.3 ng/mL — ABNORMAL LOW (ref 0.8–2.0)

## 2011-11-04 LAB — PROTIME-INR
INR: 1.08 (ref 0.00–1.49)
INR: 1.2 (ref 0.00–1.49)
Prothrombin Time: 14.2 seconds (ref 11.6–15.2)
Prothrombin Time: 15.5 seconds — ABNORMAL HIGH (ref 11.6–15.2)

## 2011-11-04 LAB — CBC
HCT: 32.9 % — ABNORMAL LOW (ref 36.0–46.0)
Hemoglobin: 10.9 g/dL — ABNORMAL LOW (ref 12.0–15.0)
MCH: 26.7 pg (ref 26.0–34.0)
MCHC: 33.1 g/dL (ref 30.0–36.0)
MCV: 80.4 fL (ref 78.0–100.0)
Platelets: 348 10*3/uL (ref 150–400)
RBC: 4.09 MIL/uL (ref 3.87–5.11)
RDW: 18.2 % — ABNORMAL HIGH (ref 11.5–15.5)
WBC: 9.7 10*3/uL (ref 4.0–10.5)

## 2011-11-04 MED ORDER — ONDANSETRON HCL 4 MG/2ML IJ SOLN
4.0000 mg | Freq: Once | INTRAMUSCULAR | Status: AC
  Administered 2011-11-04: 4 mg via INTRAVENOUS
  Filled 2011-11-04: qty 2

## 2011-11-04 MED ORDER — MORPHINE SULFATE 4 MG/ML IJ SOLN
4.0000 mg | Freq: Once | INTRAMUSCULAR | Status: AC
  Administered 2011-11-04: 4 mg via INTRAVENOUS
  Filled 2011-11-04: qty 1

## 2011-11-04 MED ORDER — HYDROMORPHONE HCL PF 1 MG/ML IJ SOLN
1.0000 mg | Freq: Once | INTRAMUSCULAR | Status: AC
  Administered 2011-11-04: 1 mg via INTRAVENOUS
  Filled 2011-11-04: qty 1

## 2011-11-04 MED ORDER — SODIUM CHLORIDE 0.9 % IV BOLUS (SEPSIS)
1000.0000 mL | Freq: Once | INTRAVENOUS | Status: AC
  Administered 2011-11-04: 1000 mL via INTRAVENOUS

## 2011-11-04 MED ORDER — SODIUM CHLORIDE 0.9 % IV BOLUS (SEPSIS)
500.0000 mL | Freq: Once | INTRAVENOUS | Status: AC
  Administered 2011-11-04: 500 mL via INTRAVENOUS

## 2011-11-04 MED ORDER — OXYCODONE-ACETAMINOPHEN 5-325 MG PO TABS: 1.0000 | ORAL_TABLET | Freq: Four times a day (QID) | ORAL | Status: AC | PRN

## 2011-11-04 MED ORDER — HYDROMORPHONE HCL PF 2 MG/ML IJ SOLN
2.0000 mg | Freq: Once | INTRAMUSCULAR | Status: AC
  Administered 2011-11-04: 2 mg via INTRAVENOUS
  Filled 2011-11-04 (×2): qty 1

## 2011-11-04 NOTE — ED Notes (Signed)
[id number]<NW> Expected date:<BR> Expected time:<BR> Means of arrival:<BR> Comments:<BR> Medic 261, 44 F, Abd Pain Rm 10

## 2011-11-04 NOTE — ED Notes (Signed)
PA at bedside.

## 2011-11-04 NOTE — ED Notes (Signed)
[id number]<AV> Expected date:11/04/11<BR> Expected time: 3:46 AM<BR> Means of arrival:Ambulance<BR> Comments:<BR> RM 16, 56 yo F, acute abdominal pain, hx of cholelithiasis

## 2011-11-04 NOTE — ED Notes (Signed)
Report given via EMS. Pt c/o abdominal RLQ constant sharp non radiating pain. No N/V/D. No fever. Initial VS at 2140 126 palpated Pulse 90 RR 22 non labored. No IV.  Pt was discharged early morning from [facility].

## 2011-11-04 NOTE — ED Provider Notes (Signed)
History     CSN: [id number]  Arrival date & time 11/04/11  2217   First MD Initiated Contact with Patient 11/04/11 2223      Chief Complaint  Patient presents with  . Abdominal Pain   HPI  History provided by the patient and recent chart. Patient is a 56 year old female with history of hypertension, diabetes, lupus, COPD, lower extremity DVT who returns for reevaluation of worsening right lower abdominal pain. Patient was seen in emergency room late last night early this morning for similar symptoms. She had improvement of pain after medications and treatment. She had unremarkable lab tests and urinalysis. Patient is currently on Coumadin for DVTs in the lower leg as well as Lovenox which she has been taking. Patient returns for persistent and worsening pains. She denies any new symptoms. There has been no reports of fever, chills, sweats, nausea, vomiting, diarrhea or constipation. She has not noticed any changes in urination. Patient did receive recent spinal injections as well. She denies back pain or headache symptoms. Patient does have a history of abdominal hysterectomy but no other abdominal surgeries. She still has her gallbladder and appendix.    Past Medical History  Diagnosis Date  . DVT (deep venous thrombosis)     Recurrent  . Hypertension   . Diabetes mellitus   . Adrenal mass 03/226    Benign  . COPD (chronic obstructive pulmonary disease)   . Lupus Anticoagulant Positive   . Clotting disorder     +beta-2-glycoprotein IgA antibody  . History of cocaine abuse     Remote history   . Arthritis     knees/multiple orthopedic conditons    Past Surgical History  Procedure Date  . Carpal tunnel release     Bilateral  . Abdominal hysterectomy 1989    Fibroids  . Back surgery     cervical spine---disk disease  . Cesarean section   . Replacement total knee 11/17/2008    Left knee    No family history on file.  History  Substance Use Topics  . Smoking status:  Not on file  . Smokeless tobacco: Not on file  . Alcohol Use: Not on file    OB History    Grav Para Term Preterm Abortions TAB SAB Ect Mult Living                  Review of Systems  Constitutional: Positive for appetite change. Negative for fever and chills.  Respiratory: Negative for cough.   Cardiovascular: Negative for chest pain.  Gastrointestinal: Positive for abdominal pain. Negative for nausea, vomiting, diarrhea and constipation.  Genitourinary: Negative for dysuria, frequency, hematuria and flank pain.    Allergies  Review of patient's allergies indicates no known allergies.  Home Medications   Current Outpatient Rx  Name Route Sig Dispense Refill  . ALBUTEROL SULFATE HFA 108 (90 BASE) MCG/ACT IN AERS Inhalation Inhale 2 puffs into the lungs every 4 (four) hours as needed. For shortness of breath    . AMLODIPINE BESYLATE 5 MG PO TABS Oral Take 5 mg by mouth daily.      . BUDESONIDE-FORMOTEROL FUMARATE 80-4.5 MCG/ACT IN AERO Inhalation Inhale 2 puffs into the lungs 2 (two) times daily.      Marland Kitchen CLONIDINE HCL 0.1 MG PO TABS Oral Take 0.1 mg by mouth 2 (two) times daily.      Marland Kitchen DICLOFENAC SODIUM 75 MG PO TBEC Oral Take 75 mg by mouth 2 (two) times daily as needed.      Marland Kitchen  DILTIAZEM HCL ER COATED BEADS 180 MG PO CP24 Oral Take 180 mg by mouth daily.      . DULOXETINE HCL 60 MG PO CPEP Oral Take 60 mg by mouth daily.      Marland Kitchen ENOXAPARIN SODIUM 120 MG/0.8ML [place] SOLN Subcutaneous Inject 120 mg into the skin every 12 (twelve) hours.     Marland Kitchen LORATADINE 10 MG PO TABS Oral Take 10 mg by mouth daily.      Marland Kitchen METFORMIN HCL 500 MG PO TABS Oral Take 500 mg by mouth daily with breakfast. Checks CBG every MWF    . OMEPRAZOLE 20 MG PO CPDR Oral Take 20 mg by mouth 2 (two) times daily.    Marland Kitchen POLYETHYLENE GLYCOL 3350 PO PACK Oral Take 17 g by mouth daily.      . QUETIAPINE FUMARATE 50 MG PO TABS Oral Take 50 mg by mouth 2 (two) times daily.      Marland Kitchen SIMVASTATIN 20 MG PO TABS Oral Take 20 mg by  mouth at bedtime.      . OXYCODONE-ACETAMINOPHEN 5-325 MG PO TABS Oral Take 1 tablet by mouth every 6 (six) hours as needed for pain. May take 2 tablets PO q 6 hours for severe pain - Do not take with Tylenol as this tablet already contains tylenol 12 tablet 0  . WARFARIN SODIUM 5 MG PO TABS Oral Take 10-15 mg by mouth daily. Take 15mg  on Tuesday, Thursday, and Saturday. Take 10mg  on Monday, Wednesday, Friday, and Sunday.      There were no vitals taken for this visit.  Physical Exam  Nursing note and vitals reviewed. Constitutional: She is oriented to person, place, and time. She appears well-developed and well-nourished. No distress.  HENT:  Head: Normocephalic.  Cardiovascular: Normal rate and regular rhythm.   Pulmonary/Chest: Effort normal and breath sounds normal. No respiratory distress. She has no wheezes. She has no rales.  Abdominal: Soft. There is tenderness in the right lower quadrant. There is no rebound, no guarding and no CVA tenderness.       Morbidly obese. Exam is limited by body habitus. Severe pain in the right lower quadrant area. No CVA tenderness.  Neurological: She is alert and oriented to person, place, and time.  Skin: Skin is warm and dry.  Psychiatric: She has a normal mood and affect. Her behavior is normal.    ED Course  Procedures   Results for orders placed during the hospital encounter of 11/04/11  CBC WITH DIFFERENTIAL      Component Value Range   WBC 9.9  4.0 - 10.5 K/uL   RBC 4.22  3.87 - 5.11 MIL/uL   Hemoglobin 11.2 (*) 12.0 - 15.0 g/dL   HCT 78.2 (*) 95.6 - 21.3 %   MCV 79.1  78.0 - 100.0 fL   MCH 26.5  26.0 - 34.0 pg   MCHC 33.5  30.0 - 36.0 g/dL   RDW 08.6 (*) 57.8 - 46.9 %   Platelets 358  150 - 400 K/uL   Neutrophils Relative 65  43 - 77 %   Neutro Abs 6.5  1.7 - 7.7 K/uL   Lymphocytes Relative 26  12 - 46 %   Lymphs Abs 2.6  0.7 - 4.0 K/uL   Monocytes Relative 8  3 - 12 %   Monocytes Absolute 0.8  0.1 - 1.0 K/uL   Eosinophils  Relative 0  0 - 5 %   Eosinophils Absolute 0.0  0.0 - 0.7 K/uL  Basophils Relative 0  0 - 1 %   Basophils Absolute 0.0  0.0 - 0.1 K/uL  BASIC METABOLIC PANEL      Component Value Range   Sodium 134 (*) 135 - 145 mEq/L   Potassium 3.4 (*) 3.5 - 5.1 mEq/L   Chloride 96  96 - 112 mEq/L   CO2 26  19 - 32 mEq/L   Glucose, Bld 139 (*) 70 - 99 mg/dL   BUN 13  6 - 23 mg/dL   Creatinine, Ser 1.61  0.50 - 1.10 mg/dL   Calcium 9.8  8.4 - 09.6 mg/dL   GFR calc non Af Amer >90  >90 mL/min   GFR calc Af Amer >90  >90 mL/min  LACTIC ACID, PLASMA      Component Value Range   Lactic Acid, Venous 2.0  0.5 - 2.2 mmol/L  PROTIME-INR      Component Value Range   Prothrombin Time 15.5 (*) 11.6 - 15.2 seconds   INR 1.20  0.00 - 1.49       Ct Abdomen Pelvis W Contrast  11/05/2011  *RADIOLOGY REPORT*  Clinical Data: Right lower quadrant and suprapubic pain.  CT ABDOMEN AND PELVIS WITH CONTRAST  Technique:  Multidetector CT imaging of the abdomen and pelvis was performed following the standard protocol during bolus administration of intravenous contrast.  Contrast: OMNIPAQUE IOHEXOL 300 MG/ML  SOLN  Comparison: 06/16/2009  Findings: Mild dependent changes in the lung bases.  Contrast material in the lower esophagus which may be due to reflux or dysmotility.  The  Sub centimeter low attenuation lesion in the lateral segment left lobe of the liver consistent with small cyst and stable since previous study.  Diffuse low attenuation change in the liver consistent with fatty infiltration.  Surgical absence of the gallbladder.  The pancreas, spleen, left adrenal gland, kidneys, abdominal aorta, and retroperitoneal lymph nodes are unremarkable. The stomach, small bowel, and colon are not abnormally distended. Diffusely stool filled colon.  Prominent visceral adipose tissues. No free air or free fluid in the abdomen. Left retroperitoneal calcifications most consistent with vascular calcifications.  No ureteral  stones demonstrated.  Pelvis:  Bladder wall is not thickened.  Uterus appears surgically absent.  No abnormal adnexal masses.  The appendix is well seen and is normal.  No free or loculated pelvic fluid collections.  No evidence of diverticulitis.  Infiltration and gas in the subcutaneous fat over the low pelvic wall, likely representing sites of injection.  No significant pelvic lymphadenopathy. Degenerative changes in the lumbar spine and hips.  IMPRESSION: No acute process demonstrated in the abdomen or pelvis.  Fatty infiltration of the liver.  Normal appendix.  Original Report Authenticated By: Marlon Pel, M.D.     1. Abdominal pain       MDM  10:20 PM patient seen and evaluated. Patient appears in moderate distress and discomfort. Patient returns for reevaluation and worsening right lower abdomen pains.  Pt was seen and evaluated by Attending Physician.  Pt with focal RLQ pain and guarding.  CT pending.  CT scan without any concerning findings at this time to explain RLQ pain.  Spoke with Dr. Selena Batten with [facility].  He will see pt and admit for obs and serial exams.      Angus Seller, [name] 11/05/11 [phone]

## 2011-11-04 NOTE — ED Notes (Signed)
Pt has called for ride and is ready to be discharged when son arrives

## 2011-11-04 NOTE — ED Provider Notes (Signed)
History     CSN: [id number]  Arrival date & time 11/04/11  4098   First MD Initiated Contact with Patient 11/04/11 0405      Chief Complaint  Patient presents with  . Abdominal Pain   HPI  History provided by the patient. Patient is a 56 year old female with history of hypertension, diabetes, hyperlipidemia, lupus, previous DVTs on Coumadin, hysterectomy who presents with complaints of right lower quadrant and suprapubic pains. Patient states she awoke from sharp pains in her lower abdomen around 3 AM. Pain has been persistent and radiates some down her right anterior thigh area. Patient denies having similar symptoms previously. Patient does report recently having spinal injections for chronic pains a few days ago. She denies any pains or problems to the back to this time. Patient did not take any medications for symptoms this morning. She denies any associated fever, chills, nausea, vomiting, diarrhea or constipation. She denies any dysuria, hematuria or flank pain. Patient does report some urinary frequency.   Past Medical History  Diagnosis Date  . DVT (deep venous thrombosis)     Recurrent  . Hypertension   . Diabetes mellitus   . Adrenal mass 03/226    Benign  . COPD (chronic obstructive pulmonary disease)   . Lupus Anticoagulant Positive   . Clotting disorder     +beta-2-glycoprotein IgA antibody  . History of cocaine abuse     Remote history   . Arthritis     knees/multiple orthopedic conditons    Past Surgical History  Procedure Date  . Carpal tunnel release     Bilateral  . Abdominal hysterectomy 1989    Fibroids  . Back surgery     cervical spine---disk disease  . Cesarean section   . Replacement total knee 11/17/2008    Left knee    No family history on file.  History  Substance Use Topics  . Smoking status: Not on file  . Smokeless tobacco: Not on file  . Alcohol Use: Not on file    OB History    Grav Para Term Preterm Abortions TAB SAB Ect Mult  Living                  Review of Systems  Constitutional: Negative for fever and chills.  Respiratory: Negative for cough and shortness of breath.   Cardiovascular: Negative for chest pain.  Gastrointestinal: Positive for abdominal pain. Negative for nausea, vomiting, diarrhea and blood in stool.  Genitourinary: Positive for frequency. Negative for dysuria, hematuria and flank pain.    Allergies  Review of patient's allergies indicates no known allergies.  Home Medications   Current Outpatient Rx  Name Route Sig Dispense Refill  . ALBUTEROL SULFATE HFA 108 (90 BASE) MCG/ACT IN AERS Inhalation Inhale 2 puffs into the lungs every 4 (four) hours as needed. For shortness of breath    . AMLODIPINE BESYLATE 5 MG PO TABS Oral Take 5 mg by mouth daily.      . BUDESONIDE-FORMOTEROL FUMARATE 80-4.5 MCG/ACT IN AERO Inhalation Inhale 2 puffs into the lungs 2 (two) times daily.      Marland Kitchen CLONIDINE HCL 0.1 MG PO TABS Oral Take 0.1 mg by mouth 2 (two) times daily.      Marland Kitchen DICLOFENAC SODIUM 75 MG PO TBEC Oral Take 75 mg by mouth 2 (two) times daily as needed.      Marland Kitchen DILTIAZEM HCL ER COATED BEADS 180 MG PO CP24 Oral Take 180 mg by mouth daily.      Marland Kitchen  DULOXETINE HCL 60 MG PO CPEP Oral Take 60 mg by mouth daily.      Marland Kitchen ENOXAPARIN SODIUM 120 MG/0.8ML [place] SOLN Subcutaneous Inject 120 mg into the skin every 12 (twelve) hours.     Marland Kitchen LORATADINE 10 MG PO TABS Oral Take 10 mg by mouth daily.      Marland Kitchen METFORMIN HCL 500 MG PO TABS Oral Take 500 mg by mouth daily with breakfast. Checks CBG every MWF    . OMEPRAZOLE 20 MG PO CPDR Oral Take 20 mg by mouth 2 (two) times daily.    . OXYCODONE-ACETAMINOPHEN 5-325 MG PO TABS Oral Take 1 tablet by mouth every 6 (six) hours as needed.    Marland Kitchen POLYETHYLENE GLYCOL 3350 PO PACK Oral Take 17 g by mouth daily.      . QUETIAPINE FUMARATE 50 MG PO TABS Oral Take 50 mg by mouth 2 (two) times daily.      Marland Kitchen SIMVASTATIN 20 MG PO TABS Oral Take 20 mg by mouth at bedtime.      . WARFARIN  SODIUM 5 MG PO TABS Oral Take 10-15 mg by mouth daily. Take 15mg  on Tuesday, Thursday, and Saturday. Take 10mg  on Monday, Wednesday, and Sunday.      BP 143/82  Pulse 78  Temp 98.4 F (36.9 C) (Oral)  Resp 21  SpO2 97%  Physical Exam  Nursing note and vitals reviewed. Constitutional: She is oriented to person, place, and time. She appears well-developed and well-nourished. No distress.  HENT:  Head: Normocephalic.  Cardiovascular: Normal rate and regular rhythm.   Pulmonary/Chest: Effort normal and breath sounds normal. No respiratory distress. She has no wheezes. She has no rales.  Abdominal: Soft. There is tenderness in the right lower quadrant and suprapubic area. There is no rebound, no guarding, no CVA tenderness, no tenderness at McBurney's point and negative Murphy's sign.       Obese. Exam is limited by body habitus.  Neurological: She is alert and oriented to person, place, and time.  Skin: Skin is warm and dry.  Psychiatric: She has a normal mood and affect. Her behavior is normal.    ED Course  Procedures   Results for orders placed during the hospital encounter of 11/04/11  CBC      Component Value Range   WBC 9.7  4.0 - 10.5 K/uL   RBC 4.09  3.87 - 5.11 MIL/uL   Hemoglobin 10.9 (*) 12.0 - 15.0 g/dL   HCT 16.1 (*) 09.6 - 04.5 %   MCV 80.4  78.0 - 100.0 fL   MCH 26.7  26.0 - 34.0 pg   MCHC 33.1  30.0 - 36.0 g/dL   RDW 40.9 (*) 81.1 - 91.4 %   Platelets 348  150 - 400 K/uL  BASIC METABOLIC PANEL      Component Value Range   Sodium 134 (*) 135 - 145 mEq/L   Potassium 3.5  3.5 - 5.1 mEq/L   Chloride 97  96 - 112 mEq/L   CO2 24  19 - 32 mEq/L   Glucose, Bld 145 (*) 70 - 99 mg/dL   BUN 14  6 - 23 mg/dL   Creatinine, Ser 7.82  0.50 - 1.10 mg/dL   Calcium 9.1  8.4 - 95.6 mg/dL   GFR calc non Af Amer >90  >90 mL/min   GFR calc Af Amer >90  >90 mL/min  PROTIME-INR      Component Value Range   Prothrombin Time 14.2  11.6 -  15.2 seconds   INR 1.08  0.00 - 1.49    URINALYSIS, ROUTINE W REFLEX MICROSCOPIC      Component Value Range   Color, Urine YELLOW  YELLOW   APPearance CLEAR  CLEAR   Specific Gravity, Urine 1.013  1.005 - 1.030   pH 6.5  5.0 - 8.0   Glucose, UA NEGATIVE  NEGATIVE mg/dL   Hgb urine dipstick NEGATIVE  NEGATIVE   Bilirubin Urine NEGATIVE  NEGATIVE   Ketones, ur NEGATIVE  NEGATIVE mg/dL   Protein, ur NEGATIVE  NEGATIVE mg/dL   Urobilinogen, UA 0.2  0.0 - 1.0 mg/dL   Nitrite NEGATIVE  NEGATIVE   Leukocytes, UA NEGATIVE  NEGATIVE  DIGOXIN LEVEL      Component Value Range   Digoxin Level <0.3 (*) 0.8 - 2.0 ng/mL        1. Abdominal pain       MDM  4:25 AM patient seen and evaluated. Patient appears uncomfortable rocking in bed. Patient denies having similar symptoms previously. Will initiate workup with lab testing and urinalysis. Morphine and Zofran ordered for pain and nausea.   She now having good improvement of pain after medications. She has no peritoneal signs on re-exam. Labs are unremarkable today. No signs of UTI. No blood in urine. Patient without flank pain. No elevated WBC. At this time have low suspicion for appendicitis. Patient feels she is able to return home. She's been given strict return precautions and advised to have a recheck of symptoms in the next 24-48 hours.    Angus Seller, [name] 11/04/11 [phone]

## 2011-11-04 NOTE — ED Notes (Signed)
Peter, PA said to wait to do vitals until after pain medicine is given.

## 2011-11-04 NOTE — ED Notes (Signed)
Pt was here last night for abdominal pain. Pain has worsened today. Pt is in obvious distress, moaning, and unable to sit up. MD at bedside.

## 2011-11-04 NOTE — ED Notes (Signed)
Pt here with c/o RLQ pain that radiates down her right thigh that began at 0300 this morning. Rates pain 10/10.

## 2011-11-05 ENCOUNTER — Observation Stay (HOSPITAL_COMMUNITY): Payer: Medicare Other

## 2011-11-05 ENCOUNTER — Encounter (HOSPITAL_COMMUNITY): Payer: Self-pay | Admitting: Internal Medicine

## 2011-11-05 DIAGNOSIS — I10 Essential (primary) hypertension: Secondary | ICD-10-CM | POA: Diagnosis present

## 2011-11-05 DIAGNOSIS — R109 Unspecified abdominal pain: Secondary | ICD-10-CM | POA: Diagnosis present

## 2011-11-05 DIAGNOSIS — E1169 Type 2 diabetes mellitus with other specified complication: Secondary | ICD-10-CM | POA: Diagnosis present

## 2011-11-05 DIAGNOSIS — E119 Type 2 diabetes mellitus without complications: Secondary | ICD-10-CM | POA: Diagnosis present

## 2011-11-05 DIAGNOSIS — E669 Obesity, unspecified: Secondary | ICD-10-CM | POA: Diagnosis present

## 2011-11-05 DIAGNOSIS — E876 Hypokalemia: Secondary | ICD-10-CM | POA: Diagnosis present

## 2011-11-05 DIAGNOSIS — M5430 Sciatica, unspecified side: Secondary | ICD-10-CM | POA: Diagnosis present

## 2011-11-05 LAB — GLUCOSE, CAPILLARY
Glucose-Capillary: 105 mg/dL — ABNORMAL HIGH (ref 70–99)
Glucose-Capillary: 117 mg/dL — ABNORMAL HIGH (ref 70–99)

## 2011-11-05 LAB — HEMOGLOBIN A1C
Hgb A1c MFr Bld: 6.7 % — ABNORMAL HIGH (ref <5.7)
Mean Plasma Glucose: 146 mg/dL — ABNORMAL HIGH (ref <117)

## 2011-11-05 MED ORDER — ALBUTEROL SULFATE HFA 108 (90 BASE) MCG/ACT IN AERS
2.0000 | INHALATION_SPRAY | RESPIRATORY_TRACT | Status: DC | PRN
  Filled 2011-11-05: qty 6.7

## 2011-11-05 MED ORDER — ONDANSETRON HCL 4 MG/2ML IJ SOLN
INTRAMUSCULAR | Status: AC
  Administered 2011-11-05: 4 mg via INTRAVENOUS
  Filled 2011-11-05: qty 2

## 2011-11-05 MED ORDER — SIMVASTATIN 20 MG PO TABS
20.0000 mg | ORAL_TABLET | Freq: Every day | ORAL | Status: DC
  Filled 2011-11-05: qty 1

## 2011-11-05 MED ORDER — WARFARIN - PHARMACIST DOSING INPATIENT: Freq: Every day | Status: DC

## 2011-11-05 MED ORDER — IOHEXOL 300 MG/ML  SOLN
100.0000 mL | Freq: Once | INTRAMUSCULAR | Status: AC | PRN
  Administered 2011-11-05: 100 mL via INTRAVENOUS

## 2011-11-05 MED ORDER — BUDESONIDE-FORMOTEROL FUMARATE 80-4.5 MCG/ACT IN AERO
2.0000 | INHALATION_SPRAY | Freq: Two times a day (BID) | RESPIRATORY_TRACT | Status: DC
  Administered 2011-11-05 – 2011-11-07 (×5): 2 via RESPIRATORY_TRACT
  Filled 2011-11-05: qty 6.9

## 2011-11-05 MED ORDER — ATORVASTATIN CALCIUM 10 MG PO TABS
10.0000 mg | ORAL_TABLET | Freq: Every day | ORAL | Status: DC
  Administered 2011-11-05 – 2011-11-06 (×2): 10 mg via ORAL
  Filled 2011-11-05 (×3): qty 1

## 2011-11-05 MED ORDER — DILTIAZEM HCL ER COATED BEADS 180 MG PO CP24
180.0000 mg | ORAL_CAPSULE | Freq: Every day | ORAL | Status: DC
  Administered 2011-11-05 – 2011-11-07 (×3): 180 mg via ORAL
  Filled 2011-11-05 (×3): qty 1

## 2011-11-05 MED ORDER — POTASSIUM CHLORIDE 10 MEQ/100ML IV SOLN
10.0000 meq | INTRAVENOUS | Status: AC
  Administered 2011-11-05 (×4): 10 meq via INTRAVENOUS
  Filled 2011-11-05 (×4): qty 100

## 2011-11-05 MED ORDER — ONDANSETRON HCL 4 MG/2ML IJ SOLN
4.0000 mg | Freq: Once | INTRAMUSCULAR | Status: AC
  Administered 2011-11-05: 4 mg via INTRAVENOUS
  Filled 2011-11-05: qty 2

## 2011-11-05 MED ORDER — WARFARIN SODIUM 10 MG PO TABS
10.0000 mg | ORAL_TABLET | Freq: Once | ORAL | Status: AC
  Administered 2011-11-05: 10 mg via ORAL
  Filled 2011-11-05: qty 1

## 2011-11-05 MED ORDER — OXYCODONE-ACETAMINOPHEN 5-325 MG PO TABS
1.0000 | ORAL_TABLET | Freq: Four times a day (QID) | ORAL | Status: DC | PRN
  Administered 2011-11-05: 1 via ORAL
  Filled 2011-11-05: qty 1

## 2011-11-05 MED ORDER — SODIUM CHLORIDE 0.9 % IV SOLN
INTRAVENOUS | Status: AC
  Administered 2011-11-05: 1000 mL via INTRAVENOUS
  Administered 2011-11-05: 05:00:00 via INTRAVENOUS

## 2011-11-05 MED ORDER — ONDANSETRON HCL 4 MG/2ML IJ SOLN
4.0000 mg | Freq: Four times a day (QID) | INTRAMUSCULAR | Status: DC | PRN
  Administered 2011-11-05 – 2011-11-06 (×5): 4 mg via INTRAVENOUS
  Filled 2011-11-05 (×3): qty 2

## 2011-11-05 MED ORDER — IBUPROFEN 600 MG PO TABS
600.0000 mg | ORAL_TABLET | Freq: Three times a day (TID) | ORAL | Status: DC
  Filled 2011-11-05 (×3): qty 1

## 2011-11-05 MED ORDER — FENTANYL 25 MCG/HR TD PT72
25.0000 ug | MEDICATED_PATCH | TRANSDERMAL | Status: DC
  Administered 2011-11-05: 25 ug via TRANSDERMAL
  Filled 2011-11-05: qty 1

## 2011-11-05 MED ORDER — HYDROMORPHONE HCL PF 1 MG/ML IJ SOLN
INTRAMUSCULAR | Status: AC
  Filled 2011-11-05: qty 2

## 2011-11-05 MED ORDER — PROMETHAZINE HCL 25 MG/ML IJ SOLN
12.5000 mg | Freq: Once | INTRAMUSCULAR | Status: AC
  Administered 2011-11-05: 12.5 mg via INTRAVENOUS
  Filled 2011-11-05: qty 1

## 2011-11-05 MED ORDER — INSULIN ASPART 100 UNIT/ML ~~LOC~~ SOLN
0.0000 [IU] | Freq: Three times a day (TID) | SUBCUTANEOUS | Status: DC
  Administered 2011-11-06 – 2011-11-07 (×2): 1 [IU] via SUBCUTANEOUS

## 2011-11-05 MED ORDER — AMLODIPINE BESYLATE 5 MG PO TABS
5.0000 mg | ORAL_TABLET | Freq: Every day | ORAL | Status: DC
  Administered 2011-11-05 – 2011-11-07 (×3): 5 mg via ORAL
  Filled 2011-11-05 (×3): qty 1

## 2011-11-05 MED ORDER — PANTOPRAZOLE SODIUM 40 MG PO TBEC
40.0000 mg | DELAYED_RELEASE_TABLET | Freq: Every day | ORAL | Status: DC
  Administered 2011-11-05: 40 mg via ORAL
  Filled 2011-11-05 (×3): qty 1

## 2011-11-05 MED ORDER — LORATADINE 10 MG PO TABS
10.0000 mg | ORAL_TABLET | Freq: Every day | ORAL | Status: DC
  Administered 2011-11-05 – 2011-11-07 (×3): 10 mg via ORAL
  Filled 2011-11-05 (×3): qty 1

## 2011-11-05 MED ORDER — OXYCODONE-ACETAMINOPHEN 5-325 MG PO TABS: 1.0000 | ORAL_TABLET | ORAL | Status: DC | PRN

## 2011-11-05 MED ORDER — HYDROMORPHONE HCL PF 1 MG/ML IJ SOLN
0.5000 mg | INTRAMUSCULAR | Status: DC | PRN
  Administered 2011-11-05: 0.5 mg via INTRAVENOUS
  Filled 2011-11-05 (×2): qty 1

## 2011-11-05 MED ORDER — HYDROMORPHONE HCL PF 2 MG/ML IJ SOLN
2.0000 mg | Freq: Once | INTRAMUSCULAR | Status: AC
  Administered 2011-11-05: 2 mg via INTRAVENOUS
  Filled 2011-11-05: qty 1

## 2011-11-05 MED ORDER — ENOXAPARIN SODIUM 120 MG/0.8ML ~~LOC~~ SOLN
120.0000 mg | Freq: Two times a day (BID) | SUBCUTANEOUS | Status: DC
  Administered 2011-11-05 – 2011-11-07 (×5): 120 mg via SUBCUTANEOUS
  Filled 2011-11-05 (×7): qty 0.8

## 2011-11-05 MED ORDER — METFORMIN HCL 500 MG PO TABS
500.0000 mg | ORAL_TABLET | Freq: Every day | ORAL | Status: DC
  Administered 2011-11-05: 500 mg via ORAL
  Filled 2011-11-05 (×2): qty 1

## 2011-11-05 MED ORDER — HYDROMORPHONE HCL PF 2 MG/ML IJ SOLN
2.0000 mg | INTRAMUSCULAR | Status: DC | PRN
  Administered 2011-11-05 – 2011-11-07 (×10): 2 mg via INTRAVENOUS
  Filled 2011-11-05 (×9): qty 1

## 2011-11-05 MED ORDER — CLONIDINE HCL 0.1 MG PO TABS
0.1000 mg | ORAL_TABLET | Freq: Two times a day (BID) | ORAL | Status: DC
Start: 2011-11-05 — End: 2011-11-07
  Administered 2011-11-05 – 2011-11-07 (×5): 0.1 mg via ORAL
  Filled 2011-11-05 (×6): qty 1

## 2011-11-05 MED ORDER — POTASSIUM CHLORIDE CRYS ER 20 MEQ PO TBCR
40.0000 meq | EXTENDED_RELEASE_TABLET | Freq: Once | ORAL | Status: AC
  Administered 2011-11-05: 40 meq via ORAL
  Filled 2011-11-05: qty 2

## 2011-11-05 MED ORDER — QUETIAPINE FUMARATE 50 MG PO TABS
50.0000 mg | ORAL_TABLET | Freq: Two times a day (BID) | ORAL | Status: DC
  Administered 2011-11-05 – 2011-11-07 (×5): 50 mg via ORAL
  Filled 2011-11-05 (×6): qty 1

## 2011-11-05 MED ORDER — DULOXETINE HCL 60 MG PO CPEP
60.0000 mg | ORAL_CAPSULE | Freq: Every day | ORAL | Status: DC
  Administered 2011-11-05 – 2011-11-07 (×3): 60 mg via ORAL
  Filled 2011-11-05 (×3): qty 1

## 2011-11-05 MED ORDER — ACETYLCYSTEINE 20 % IN SOLN: 600.0000 mg | Freq: Two times a day (BID) | RESPIRATORY_TRACT | Status: DC

## 2011-11-05 MED ORDER — POLYETHYLENE GLYCOL 3350 17 G PO PACK
17.0000 g | PACK | Freq: Every day | ORAL | Status: DC
  Administered 2011-11-05 – 2011-11-07 (×3): 17 g via ORAL
  Filled 2011-11-05 (×3): qty 1

## 2011-11-05 MED ORDER — IBUPROFEN 600 MG PO TABS
600.0000 mg | ORAL_TABLET | Freq: Three times a day (TID) | ORAL | Status: DC
  Filled 2011-11-05 (×2): qty 1

## 2011-11-05 NOTE — ED Notes (Signed)
Report called to floor RN, [name] 

## 2011-11-05 NOTE — Progress Notes (Signed)
Pt complained of pain in her RT. Hip and RT.lower abdomen. She also has complained of nausea, which she was medicated for.

## 2011-11-05 NOTE — ED Notes (Signed)
Pt's family contact--SON--MARQAS Theroux [phone]  Or Mickle Mallory  [phone]

## 2011-11-05 NOTE — ED Notes (Signed)
Pt's O2 sats dropped to 85%.  Placed on 2L O2 via nasal cannula by C Belinda Schlichting NT and sats now at 96

## 2011-11-05 NOTE — Progress Notes (Signed)
[facility]                                                                                Patient Demographics  [name], is a 56 y.o. female  YNW:[id number]  MVH:[id number]  DOB - Apr 15, 1955  Admit date - 11/04/2011  Admitting Physician Massie Maroon, MD  Outpatient Primary MD for the patient is Dorrene German, MD  LOS - 1   Chief Complaint  Patient presents with  . Abdominal Pain        Assessment & Plan    1. Right sided lower back pain radiating to the front of her right thigh and right knee -  Post right L-spine steroid shot at neurosurgery office for a few days ago, MRI of the L-spine does not show any acute changes, CT abdomen pelvis done yesterday also remained stable. Have discussed her case with neurosurgeon on call Dr. Gerlene Fee who suggested he has nothing to offer, he says that at this time pain control, physical therapy should be the main stay when she is able to bear weight she should follow with her primary neurosurgeon Dr. Cabell/Dr. Juanetta Gosling.    2. Diabetes mellitus type 2- no acute issues, Glucophage has been discontinued as patient has recently received IV contrast yesterday for MRI CT scan, q. a.c. Accu-Cheks with low-dose NovoLog sliding scale for now.    3. Hypokalemia replaced check BMP in the morning.    4. History of DVTs-agent on long-term Coumadin, Coumadin was held recently for the spinal injection, currently on full dose Lovenox, pharmacy to resume Coumadin with daily INR, stop Coumadin once INR is 2 patient has been on Coumadin for a long time.    5. History of essential hypertension no acute issues continue home medications, pain control as above and monitor blood pressures.     Code Status: Full  Family Communication: Discussed with the patient bedside  Disposition Plan: Likely home    Procedures CT abdomen pelvis, MRI L-spine, x-ray her right hip    Consults  neurosurgery Dr. Gerlene Fee over the phone on  11/05/2011   Antibiotics  none   Time Spent in minutes   30   DVT Prophylaxis  Lovenox and Coumadin overlap dosed by pharmacy   Leroy Sea M.D on 11/05/2011 at 11:19 AM  Between 7am to 7pm - Pager - [phone]  After 7pm go to [email or web address] - password [email or web address]  And look for the night coverage person covering for me after hours  [facility] Office  [phone]    Subjective:   [name] today has, No headache, No chest pain, No abdominal pain - No Nausea, No new weakness tingling or numbness, No Cough - SOB. Continues to have sharp lower right sided back pain radiating all the way to the front of her thigh down to her right knee.  Objective:   Filed Vitals:   11/05/11 0509 11/05/11 0544 11/05/11 0946 11/05/11 0947  BP: 135/78 158/85 158/85 158/85  Pulse: 74 72    Temp: 98.2 F (36.8 C) 98.6 F (37 C)    TempSrc: Oral Oral    Resp: 20 22    Weight:  130.9 kg (288  lb 9.3 oz)    SpO2: 99% 100%      Wt Readings from Last 3 Encounters:  11/05/11 130.9 kg (288 lb 9.3 oz)  09/12/11 126.191 kg (278 lb 3.2 oz)  03/27/11 121.564 kg (268 lb)     Intake/Output Summary (Last 24 hours) at 11/05/11 1119 Last data filed at 11/05/11 1009  Gross per 24 hour  Intake   2240 ml  Output    350 ml  Net   1890 ml    Exam Awake Alert, Oriented X 3, No new F.N deficits, Normal affect [place].AT,PERRAL Supple Neck,No JVD, No cervical lymphadenopathy appriciated.  Symmetrical Chest wall movement, Good air movement bilaterally, CTAB RRR,No Gallops,Rubs or new Murmurs, No Parasternal Heave +ve B.Sounds, Abd Soft, Non tender, No organomegaly appriciated, No rebound - guarding or rigidity. No Cyanosis, Clubbing or edema, No new Rash or bruise     Data Review   CBC  Lab 11/04/11 2255 11/04/11 0438  WBC 9.9 9.7  HGB 11.2* 10.9*  HCT 33.4* 32.9*  PLT 358 348  MCV 79.1 80.4  MCH 26.5 26.7  MCHC 33.5 33.1  RDW 18.1* 18.2*  LYMPHSABS 2.6 --  MONOABS 0.8 --    EOSABS 0.0 --  BASOSABS 0.0 --  BANDABS -- --    Chemistries   Lab 11/04/11 2255 11/04/11 0438  NA 134* 134*  K 3.4* 3.5  CL 96 97  CO2 26 24  GLUCOSE 139* 145*  BUN 13 14  CREATININE 0.66 0.62  CALCIUM 9.8 9.1  MG -- --  AST -- --  ALT -- --  ALKPHOS -- --  BILITOT -- --   ------------------------------------------------------------------------------------------------------------------ CrCl is unknown because both a height and weight (above a minimum accepted value) are required for this calculation. ------------------------------------------------------------------------------------------------------------------ No results found for this basename: HGBA1C:2 in the last 72 hours ------------------------------------------------------------------------------------------------------------------ No results found for this basename: CHOL:2,HDL:2,LDLCALC:2,TRIG:2,CHOLHDL:2,LDLDIRECT:2 in the last 72 hours ------------------------------------------------------------------------------------------------------------------ No results found for this basename: TSH,T4TOTAL,FREET3,T3FREE,THYROIDAB in the last 72 hours ------------------------------------------------------------------------------------------------------------------ No results found for this basename: VITAMINB12:2,FOLATE:2,FERRITIN:2,TIBC:2,IRON:2,RETICCTPCT:2 in the last 72 hours  Coagulation profile  Lab 11/04/11 2255 11/04/11 0438 11/01/11 1019  INR 1.20 1.08 1.20*  PROTIME -- -- 14.4*    No results found for this basename: DDIMER:2 in the last 72 hours  Cardiac Enzymes No results found for this basename: CK:3,CKMB:3,TROPONINI:3,MYOGLOBIN:3 in the last 168 hours ------------------------------------------------------------------------------------------------------------------ No components found with this basename: POCBNP:3  Micro Results No results found for this or any previous visit (from the past 240  hour(s)).  Radiology Reports Mr Lumbar Spine Wo Contrast  11/05/2011  *RADIOLOGY REPORT*  Clinical Data: 56 year old female with low back pain radiating to the right groin.  MRI LUMBAR SPINE WITHOUT CONTRAST  Technique:  Multiplanar and multiecho pulse sequences of the lumbar spine were obtained without intravenous contrast.  Comparison: 09/19/2011 and earlier.  Findings: Stable vertebral height and alignment. No marrow edema or evidence of acute osseous abnormality.   Visualized lower thoracic spinal cord is normal with conus medularis at L1.  Visualized paraspinal soft tissues are within normal limits. Stable visualized abdominal viscera, including chronic right adrenal adenoma stable since 2006.  T10-T11:  Chronic posterior element hypertrophy.  No significant stenosis.  T11-T12:  Chronic anterior eccentric disc bulge and posterior element hypertrophy.  No significant stenosis.  T12-L1:  Negative.  L1-L2:  Negative.  L2-L3:  Mild disc desiccation and disc bulge.  No stenosis.  L3-L4:  Mild disc desiccation and disc bulge.  Stable mild to moderate  facet hypertrophy.  No stenosis.  L4-L5:  Stable disc desiccation and disc bulge.  Moderate to severe facet hypertrophy, greater on the right, is stable.  Stable minimal to mild right L4 foraminal stenosis.  No spinal or lateral recess stenosis.  L5-S1:  Minor disc desiccation.  Stable moderate facet hypertrophy. No stenosis.  IMPRESSION: Stable lumbar spine with no significant disc disease, and no spinal stenosis or convincing neural impingement.  There is chronic severe facet degeneration at L4-L5, greater on the right.  Original Report Authenticated By: Harley Hallmark, M.D.   Ct Abdomen Pelvis W Contrast  11/05/2011  *RADIOLOGY REPORT*  Clinical Data: Right lower quadrant and suprapubic pain.  CT ABDOMEN AND PELVIS WITH CONTRAST  Technique:  Multidetector CT imaging of the abdomen and pelvis was performed following the standard protocol during bolus  administration of intravenous contrast.  Contrast: OMNIPAQUE IOHEXOL 300 MG/ML  SOLN  Comparison: 06/16/2009  Findings: Mild dependent changes in the lung bases.  Contrast material in the lower esophagus which may be due to reflux or dysmotility.  The  Sub centimeter low attenuation lesion in the lateral segment left lobe of the liver consistent with small cyst and stable since previous study.  Diffuse low attenuation change in the liver consistent with fatty infiltration.  Surgical absence of the gallbladder.  The pancreas, spleen, left adrenal gland, kidneys, abdominal aorta, and retroperitoneal lymph nodes are unremarkable. The stomach, small bowel, and colon are not abnormally distended. Diffusely stool filled colon.  Prominent visceral adipose tissues. No free air or free fluid in the abdomen. Left retroperitoneal calcifications most consistent with vascular calcifications.  No ureteral stones demonstrated.  Pelvis:  Bladder wall is not thickened.  Uterus appears surgically absent.  No abnormal adnexal masses.  The appendix is well seen and is normal.  No free or loculated pelvic fluid collections.  No evidence of diverticulitis.  Infiltration and gas in the subcutaneous fat over the low pelvic wall, likely representing sites of injection.  No significant pelvic lymphadenopathy. Degenerative changes in the lumbar spine and hips.  IMPRESSION: No acute process demonstrated in the abdomen or pelvis.  Fatty infiltration of the liver.  Normal appendix.  Original Report Authenticated By: Marlon Pel, M.D.    Scheduled Meds:   . amLODipine  5 mg Oral Daily  . budesonide-formoterol  2 puff Inhalation BID  . cloNIDine  0.1 mg Oral BID  . diltiazem  180 mg Oral Daily  . DULoxetine  60 mg Oral Daily  . enoxaparin  120 mg Subcutaneous Q12H  .  HYDROmorphone (DILAUDID) injection  1 mg Intravenous Once  .  HYDROmorphone (DILAUDID) injection  2 mg Intravenous Once  .  HYDROmorphone (DILAUDID)  injection  2 mg Intravenous Once  . loratadine  10 mg Oral Daily  . ondansetron (ZOFRAN) IV  4 mg Intravenous Once  . ondansetron (ZOFRAN) IV  4 mg Intravenous Once  . pantoprazole  40 mg Oral Q1200  . polyethylene glycol  17 g Oral Daily  . potassium chloride  10 mEq Intravenous Q1 Hr x 4  . potassium chloride  40 mEq Oral Once  . promethazine  12.5 mg Intravenous Once  . QUEtiapine  50 mg Oral BID  . simvastatin  20 mg Oral QHS  . sodium chloride  1,000 mL Intravenous Once  . warfarin  10 mg Oral ONCE-1800  . Warfarin - Pharmacist Dosing Inpatient   Does not apply q1800  . DISCONTD: acetylcysteine  600 mg Oral  BID  . DISCONTD: ibuprofen  600 mg Oral TID  . DISCONTD: ibuprofen  600 mg Oral TID  . DISCONTD: metFORMIN  500 mg Oral Q breakfast   Continuous Infusions:   . sodium chloride 75 mL/hr at 11/05/11 0447   PRN Meds:.albuterol, HYDROmorphone (DILAUDID) injection, iohexol, oxyCODONE-acetaminophen

## 2011-11-05 NOTE — Progress Notes (Signed)
ANTICOAGULATION CONSULT NOTE - Initial Consult  Pharmacy Consult for Warfarin Indication: Hx of DVT and Lupus  No Known Allergies  Patient Measurements: Weight: 288 lb 9.3 oz (130.9 kg)  Vital Signs: Temp: 98.6 F (37 C) (08/18 0544) Temp src: Oral (08/18 0544) BP: 158/85 mmHg (08/18 0947) Pulse Rate: 72  (08/18 0544)  Labs:  Basename 11/04/11 2255 11/04/11 0438  HGB 11.2* 10.9*  HCT 33.4* 32.9*  PLT 358 348  APTT -- --  LABPROT 15.5* 14.2  INR 1.20 1.08  HEPARINUNFRC -- --  CREATININE 0.66 0.62  CKTOTAL -- --  CKMB -- --  TROPONINI -- --    The CrCl is unknown because both a height and weight (above a minimum accepted value) are required for this calculation.   Medical History: Past Medical History  Diagnosis Date  . DVT (deep venous thrombosis)     Recurrent  . Hypertension   . Diabetes mellitus   . Adrenal mass 03/226    Benign  . COPD (chronic obstructive pulmonary disease)   . Lupus Anticoagulant Positive   . Clotting disorder     +beta-2-glycoprotein IgA antibody  . History of cocaine abuse     Remote history   . Arthritis     knees/multiple orthopedic conditons    Medications:  Scheduled:    . amLODipine  5 mg Oral Daily  . budesonide-formoterol  2 puff Inhalation BID  . cloNIDine  0.1 mg Oral BID  . diltiazem  180 mg Oral Daily  . DULoxetine  60 mg Oral Daily  . enoxaparin  120 mg Subcutaneous Q12H  .  HYDROmorphone (DILAUDID) injection  1 mg Intravenous Once  .  HYDROmorphone (DILAUDID) injection  2 mg Intravenous Once  .  HYDROmorphone (DILAUDID) injection  2 mg Intravenous Once  . loratadine  10 mg Oral Daily  . ondansetron (ZOFRAN) IV  4 mg Intravenous Once  . ondansetron (ZOFRAN) IV  4 mg Intravenous Once  . pantoprazole  40 mg Oral Q1200  . polyethylene glycol  17 g Oral Daily  . potassium chloride  10 mEq Intravenous Q1 Hr x 4  . potassium chloride  40 mEq Oral Once  . promethazine  12.5 mg Intravenous Once  . QUEtiapine  50 mg  Oral BID  . simvastatin  20 mg Oral QHS  . sodium chloride  1,000 mL Intravenous Once  . DISCONTD: acetylcysteine  600 mg Oral BID  . DISCONTD: ibuprofen  600 mg Oral TID  . DISCONTD: ibuprofen  600 mg Oral TID  . DISCONTD: metFORMIN  500 mg Oral Q breakfast   Infusions:    . sodium chloride 75 mL/hr at 11/05/11 0447    Assessment: 56 yo F on chronic warfarin for hx of DVT and Lupus. Home dose reported as 15mg  on TuThSat and 10mg  on MWFSun, reportedly last taken 8/17 per Med Rec. Based on [facility], looks like patient had an epidural on 8/15. Plan was to stop warfarin 5 days pre-op and resume on POD#1 (8/16) warfarin 15mg  on TuThSat and 10mg  on MWFSun on and Lovenox 120mg  SQ q12h until INR is therapeutic. Pt was supposed to have her INR checked by Dr. Truett Perna on POD#5 (8/20).  Admit INR is subtherapeutic, as expected after having warfarin held for 5 days and only 2 subsequent doses of reinitiated warfarin. Will continue with warfarin dose planned by Dr. Truett Perna. Lovenox 120mg  SQ q12h already ordered by MD - order to d/c this once INR is 2.  Fore of Therapy:  INR 2-3 Monitor platelets by anticoagulation protocol: Yes   Plan:  1) Warfarin 10mg  PO x1 tonight 2) Daily INR 3) Will d/c Lovenox once INR =2.  Darrol Angel, PharmD Pager: [phone] 11/05/2011,10:45 AM

## 2011-11-05 NOTE — ED Notes (Signed)
Pt back from CT

## 2011-11-05 NOTE — H&P (Signed)
[facility] History and Physical  [name] ZOX:[id number] DOB: June 08, 1955 DOA: 11/04/2011  Referring physician: Anne Hahn PCP: Dorrene German, MD   Chief Complaint: abdominal pain  HPI:  56 yo female with Lupus, DVT, chronic back pain c/o abd pain, RLQ "sharp"  But more importantly to her pt c/o pain shooting down the front of her leg all the way to the foot.  Pt denies fever, weakness, numbness, tingling.  Her oxycodone just doesn't seem strong enough for her back pain according to the patient. Pt denies incontinence.   Review of Systems:  Negative for all organ systems except for + above.    Past Medical History  Diagnosis Date  . DVT (deep venous thrombosis)     Recurrent  . Hypertension   . Diabetes mellitus   . Adrenal mass 03/226    Benign  . COPD (chronic obstructive pulmonary disease)   . Lupus Anticoagulant Positive   . Clotting disorder     +beta-2-glycoprotein IgA antibody  . History of cocaine abuse     Remote history   . Arthritis     knees/multiple orthopedic conditons   Past Surgical History  Procedure Date  . Carpal tunnel release     Bilateral  . Abdominal hysterectomy 1989    Fibroids  . Back surgery     cervical spine---disk disease  . Cesarean section   . Replacement total knee 11/17/2008    Left knee   Social History:  does not have a smoking history on file. She does not have any smokeless tobacco history on file. Her alcohol and drug histories not on file. lives at home by herself   ,  Able to perform all ADLS  No Known Allergies  No family history on file.  (be sure to complete)  Prior to Admission medications   Medication Sig Start Date End Date Taking? Authorizing Provider  albuterol (PROVENTIL HFA;VENTOLIN HFA) 108 (90 BASE) MCG/ACT inhaler Inhale 2 puffs into the lungs every 4 (four) hours as needed. For shortness of breath   Yes Historical Provider, MD  amLODipine (NORVASC) 5 MG tablet Take 5 mg by mouth daily.     Yes  Historical Provider, MD  budesonide-formoterol (SYMBICORT) 80-4.5 MCG/ACT inhaler Inhale 2 puffs into the lungs 2 (two) times daily.     Yes Historical Provider, MD  cloNIDine (CATAPRES) 0.1 MG tablet Take 0.1 mg by mouth 2 (two) times daily.     Yes Historical Provider, MD  diclofenac (VOLTAREN) 75 MG EC tablet Take 75 mg by mouth 2 (two) times daily as needed.     Yes Historical Provider, MD  diltiazem (CARDIZEM CD) 180 MG 24 hr capsule Take 180 mg by mouth daily.     Yes Historical Provider, MD  DULoxetine (CYMBALTA) 60 MG capsule Take 60 mg by mouth daily.     Yes Historical Provider, MD  enoxaparin (LOVENOX) 120 MG/0.8ML injection Inject 120 mg into the skin every 12 (twelve) hours.    Yes Historical Provider, MD  loratadine (CLARITIN) 10 MG tablet Take 10 mg by mouth daily.     Yes Historical Provider, MD  metFORMIN (GLUCOPHAGE) 500 MG tablet Take 500 mg by mouth daily with breakfast. Checks CBG every MWF   Yes Historical Provider, MD  omeprazole (PRILOSEC) 20 MG capsule Take 20 mg by mouth 2 (two) times daily.   Yes Historical Provider, MD  oxyCODONE-acetaminophen (PERCOCET/ROXICET) 5-325 MG per tablet Take 1 tablet by mouth every 6 (six) hours as needed for  pain. May take 2 tablets PO q 6 hours for severe pain - Do not take with Tylenol as this tablet already contains tylenol 11/04/11 11/14/11 Yes Marwan T Powers, MD  polyethylene glycol (MIRALAX / GLYCOLAX) packet Take 17 g by mouth daily.     Yes Historical Provider, MD  QUEtiapine (SEROQUEL) 50 MG tablet Take 50 mg by mouth 2 (two) times daily.     Yes Historical Provider, MD  simvastatin (ZOCOR) 20 MG tablet Take 20 mg by mouth at bedtime.     Yes Historical Provider, MD  warfarin (COUMADIN) 5 MG tablet Take 10-15 mg by mouth daily. Take 15mg  on Tuesday, Thursday, and Saturday. Take 10mg  on Monday, Wednesday, Friday, and Sunday.   Yes Historical Provider, MD   Physical Exam: Filed Vitals:   11/04/11 2327 11/05/11 0100 11/05/11 0200  BP:  148/85 125/87 153/86  Pulse: 75 70 71  Resp: 22    SpO2: 96% 94% 93%     General:  Obese african [place] female  Eyes: anicteric  ENT: mmm  Neck: no jvd, no bruit, no tm  Cardiovascular: rrr s1, s2,   Respiratory: ctab  Abdomen: soft, nt, +bs  Skin: no malar rash  Musculoskeletal: slr negative  Psychiatric: axox3  Neurologic: nonfocal,  cn2-12 intact, reflexes 2+ symmetric, diffuse with downgoing toes bilaterally, motor 5/5 in all 4 ext , pinprik intact  Labs on Admission:  Basic Metabolic Panel:  Lab 11/04/11 1610 11/04/11 0438  NA 134* 134*  K 3.4* 3.5  CL 96 97  CO2 26 24  GLUCOSE 139* 145*  BUN 13 14  CREATININE 0.66 0.62  CALCIUM 9.8 9.1  MG -- --  PHOS -- --   Liver Function Tests: No results found for this basename: AST:5,ALT:5,ALKPHOS:5,BILITOT:5,PROT:5,ALBUMIN:5 in the last 168 hours No results found for this basename: LIPASE:5,AMYLASE:5 in the last 168 hours No results found for this basename: AMMONIA:5 in the last 168 hours CBC:  Lab 11/04/11 2255 11/04/11 0438  WBC 9.9 9.7  NEUTROABS 6.5 --  HGB 11.2* 10.9*  HCT 33.4* 32.9*  MCV 79.1 80.4  PLT 358 348   Cardiac Enzymes: No results found for this basename: CKTOTAL:5,CKMB:5,CKMBINDEX:5,TROPONINI:5 in the last 168 hours  BNP (last 3 results) No results found for this basename: PROBNP:3 in the last 8760 hours CBG: No results found for this basename: GLUCAP:5 in the last 168 hours  Radiological Exams on Admission: Ct Abdomen Pelvis W Contrast  11/05/2011  *RADIOLOGY REPORT*  Clinical Data: Right lower quadrant and suprapubic pain.  CT ABDOMEN AND PELVIS WITH CONTRAST  Technique:  Multidetector CT imaging of the abdomen and pelvis was performed following the standard protocol during bolus administration of intravenous contrast.  Contrast: OMNIPAQUE IOHEXOL 300 MG/ML  SOLN  Comparison: 06/16/2009  Findings: Mild dependent changes in the lung bases.  Contrast material in the lower  esophagus which may be due to reflux or dysmotility.  The  Sub centimeter low attenuation lesion in the lateral segment left lobe of the liver consistent with small cyst and stable since previous study.  Diffuse low attenuation change in the liver consistent with fatty infiltration.  Surgical absence of the gallbladder.  The pancreas, spleen, left adrenal gland, kidneys, abdominal aorta, and retroperitoneal lymph nodes are unremarkable. The stomach, small bowel, and colon are not abnormally distended. Diffusely stool filled colon.  Prominent visceral adipose tissues. No free air or free fluid in the abdomen. Left retroperitoneal calcifications most consistent with vascular calcifications.  No ureteral stones  demonstrated.  Pelvis:  Bladder wall is not thickened.  Uterus appears surgically absent.  No abnormal adnexal masses.  The appendix is well seen and is normal.  No free or loculated pelvic fluid collections.  No evidence of diverticulitis.  Infiltration and gas in the subcutaneous fat over the low pelvic wall, likely representing sites of injection.  No significant pelvic lymphadenopathy. Degenerative changes in the lumbar spine and hips.  IMPRESSION: No acute process demonstrated in the abdomen or pelvis.  Fatty infiltration of the liver.  Normal appendix.  Original Report Authenticated By: Marlon Pel, M.D.    EKG:  Assessment/Plan Active Problems:  Abdominal pain   1. R Sciatica: check MRI L spine 2. Abdominal pain  ? Secondary to strictures,  Please expand data base in am to find out presults of prior colonscopy 3. Hypokalemia:  Replete potassium 4. Anemia: check cbc in am  Code Status: Full code Family Communication:  Disposition Plan: home with PT vs SNR for rehab  Time spent: 45 minutes  Pearson Grippe [facility] Pager [phone]  If 7PM-7AM, please contact night-coverage [email or web address] Password [email or web address] 11/05/2011, 4:09 AM

## 2011-11-06 ENCOUNTER — Inpatient Hospital Stay (HOSPITAL_COMMUNITY): Payer: Medicare Other

## 2011-11-06 ENCOUNTER — Encounter (HOSPITAL_COMMUNITY): Payer: Self-pay

## 2011-11-06 LAB — COMPREHENSIVE METABOLIC PANEL
ALT: 53 U/L — ABNORMAL HIGH (ref 0–35)
AST: 37 U/L (ref 0–37)
Albumin: 3.5 g/dL (ref 3.5–5.2)
Alkaline Phosphatase: 96 U/L (ref 39–117)
BUN: 18 mg/dL (ref 6–23)
CO2: 26 mEq/L (ref 19–32)
Calcium: 8.9 mg/dL (ref 8.4–10.5)
Chloride: 100 mEq/L (ref 96–112)
Creatinine, Ser: 0.78 mg/dL (ref 0.50–1.10)
GFR calc Af Amer: >90 mL/min (ref >90)
GFR calc non Af Amer: >90 mL/min (ref >90)
Glucose, Bld: 206 mg/dL — ABNORMAL HIGH (ref 70–99)
Potassium: 4.1 mEq/L (ref 3.5–5.1)
Sodium: 136 mEq/L (ref 135–145)
Total Bilirubin: 0.1 mg/dL — ABNORMAL LOW (ref 0.3–1.2)
Total Protein: 7.3 g/dL (ref 6.0–8.3)

## 2011-11-06 LAB — PROTIME-INR
INR: 1.44 (ref 0.00–1.49)
Prothrombin Time: 17.8 seconds — ABNORMAL HIGH (ref 11.6–15.2)

## 2011-11-06 LAB — GLUCOSE, CAPILLARY
Glucose-Capillary: 108 mg/dL — ABNORMAL HIGH (ref 70–99)
Glucose-Capillary: 149 mg/dL — ABNORMAL HIGH (ref 70–99)
Glucose-Capillary: 94 mg/dL (ref 70–99)
Glucose-Capillary: 121 mg/dL — ABNORMAL HIGH (ref 70–99)

## 2011-11-06 LAB — CBC
HCT: 31.9 % — ABNORMAL LOW (ref 36.0–46.0)
Hemoglobin: 10.4 g/dL — ABNORMAL LOW (ref 12.0–15.0)
MCH: 26.6 pg (ref 26.0–34.0)
MCHC: 32.6 g/dL (ref 30.0–36.0)
MCV: 81.6 fL (ref 78.0–100.0)
Platelets: 295 10*3/uL (ref 150–400)
RBC: 3.91 MIL/uL (ref 3.87–5.11)
RDW: 19 % — ABNORMAL HIGH (ref 11.5–15.5)
WBC: 7.7 10*3/uL (ref 4.0–10.5)

## 2011-11-06 MED ORDER — WARFARIN SODIUM 10 MG PO TABS
10.0000 mg | ORAL_TABLET | Freq: Once | ORAL | Status: AC
  Administered 2011-11-06: 10 mg via ORAL
  Filled 2011-11-06: qty 1

## 2011-11-06 NOTE — Progress Notes (Signed)
[facility]                                                                                Patient Demographics  [name], is a 56 y.o. female  HYQ:[id number]  XBM:[id number]  DOB - 1955/07/14  Admit date - 11/04/2011  Admitting Physician Massie Maroon, MD  Outpatient Primary MD for the patient is Dorrene German, MD  LOS - 2   Chief Complaint  Patient presents with  . Abdominal Pain        Assessment & Plan    1. Right sided lower back pain radiating to the front of her right thigh and right knee -  Post right L-spine steroid shot at neurosurgery office of Dr. Mikal Plane a few days ago, MRI of the L-spine does not show any acute changes, CT abdomen pelvis done yesterday also remained stable. Have discussed her case with neurosurgeon on call Dr. Gerlene Fee and Dr. Mikal Plane who suggested the half nothing to offer, suggested pain control and physical therapy. Obtained x-ray of the right hip yesterday afternoon which shows a questionable lucency in the right femoral neck which was not seen on the CT done the same day, likely artifact per radiologist however in the light of patient's continued pain and discomfort with obtain an MRI of her right hip.     2. Diabetes mellitus type 2- no acute issues, Glucophage has been discontinued as patient has recently received IV contrast yesterday for MRI CT scan, q. a.c. Accu-Cheks with low-dose NovoLog sliding scale for now.   Lab Results  Component Value Date   HGBA1C 6.7* 11/04/2011    CBG (last 3)   Basename 11/06/11 0738 11/05/11 1653 11/05/11 0508  GLUCAP 108* 117* 105*       3. Hypokalemia replaced and stable.     4. History of DVTs-agent on long-term Coumadin, Coumadin was held recently for the spinal injection, currently on full dose Lovenox, pharmacy to resume Coumadin with daily INR, stop Coumadin once INR is 2 patient has been on Coumadin for a long time.  Lab Results  Component Value Date   INR 1.44  11/06/2011   INR 1.20 11/04/2011   INR 1.08 11/04/2011   PROTIME 14.4* 11/01/2011   PROTIME 14.4* 10/16/2011   PROTIME 20.4* 09/12/2011      5. History of essential hypertension no acute issues continue home medications, pain control as above and monitor blood pressures.     Code Status: Full  Family Communication: Discussed with the patient bedside  Disposition Plan: Likely home    Procedures CT abdomen pelvis, MRI L-spine, x-ray her right hip    Consults  neurosurgery Dr. Gerlene Fee over the phone on 11/05/2011, Dr. Mikal Plane neurosurgeon on 11/06/2011   Antibiotics  none   Time Spent in minutes   30   DVT Prophylaxis  Lovenox and Coumadin overlap dosed by pharmacy   Leroy Sea M.D on 11/06/2011 at 11:01 AM  Between 7am to 7pm - Pager - [phone]  After 7pm go to [email or web address] - password [email or web address]  And look for the night coverage person covering for me after hours  [facility] Office  [phone]    Subjective:  [name] today has, No headache, No chest pain, No abdominal pain - No Nausea, No new weakness tingling or numbness, No Cough - SOB. Continues to have sharp lower right sided back pain radiating all the way to the front of her thigh down to her right knee.  Objective:   Filed Vitals:   11/06/11 0241 11/06/11 0546 11/06/11 1000 11/06/11 1001  BP: 130/70 146/91 146/91 146/91  Pulse:  74    Temp:  98.4 F (36.9 C)    TempSrc:  Oral    Resp:  20    Height:      Weight:      SpO2:  99%      Wt Readings from Last 3 Encounters:  11/06/11 130.9 kg (288 lb 9.3 oz)  09/12/11 126.191 kg (278 lb 3.2 oz)  03/27/11 121.564 kg (268 lb)     Intake/Output Summary (Last 24 hours) at 11/06/11 1101 Last data filed at 11/06/11 7829  Gross per 24 hour  Intake 2548.25 ml  Output   2410 ml  Net 138.25 ml    Exam Awake Alert, Oriented X 3, No new F.N deficits, Normal affect [place].AT,PERRAL Supple Neck,No JVD, No cervical lymphadenopathy  appriciated.  Symmetrical Chest wall movement, Good air movement bilaterally, CTAB RRR,No Gallops,Rubs or new Murmurs, No Parasternal Heave +ve B.Sounds, Abd Soft, Non tender, No organomegaly appriciated, No rebound - guarding or rigidity. No Cyanosis, Clubbing or edema, No new Rash or bruise     Data Review   CBC  Lab 11/06/11 0340 11/04/11 2255 11/04/11 0438  WBC 7.7 9.9 9.7  HGB 10.4* 11.2* 10.9*  HCT 31.9* 33.4* 32.9*  PLT 295 358 348  MCV 81.6 79.1 80.4  MCH 26.6 26.5 26.7  MCHC 32.6 33.5 33.1  RDW 19.0* 18.1* 18.2*  LYMPHSABS -- 2.6 --  MONOABS -- 0.8 --  EOSABS -- 0.0 --  BASOSABS -- 0.0 --  BANDABS -- -- --    Chemistries   Lab 11/06/11 0340 11/04/11 2255 11/04/11 0438  NA 136 134* 134*  K 4.1 3.4* 3.5  CL 100 96 97  CO2 26 26 24   GLUCOSE 206* 139* 145*  BUN 18 13 14   CREATININE 0.78 0.66 0.62  CALCIUM 8.9 9.8 9.1  MG -- -- --  AST 37 -- --  ALT 53* -- --  ALKPHOS 96 -- --  BILITOT 0.1* -- --   ------------------------------------------------------------------------------------------------------------------ estimated creatinine clearance is 109 ml/min (by C-G formula based on Cr of 0.78). ------------------------------------------------------------------------------------------------------------------  [facility] 11/04/11 2255  HGBA1C 6.7*   ------------------------------------------------------------------------------------------------------------------ No results found for this basename: CHOL:2,HDL:2,LDLCALC:2,TRIG:2,CHOLHDL:2,LDLDIRECT:2 in the last 72 hours ------------------------------------------------------------------------------------------------------------------ No results found for this basename: TSH,T4TOTAL,FREET3,T3FREE,THYROIDAB in the last 72 hours ------------------------------------------------------------------------------------------------------------------ No results found for this basename:  VITAMINB12:2,FOLATE:2,FERRITIN:2,TIBC:2,IRON:2,RETICCTPCT:2 in the last 72 hours  Coagulation profile  Lab 11/06/11 0340 11/04/11 2255 11/04/11 0438 11/01/11 1019  INR 1.44 1.20 1.08 1.20*  PROTIME -- -- -- 14.4*    No results found for this basename: DDIMER:2 in the last 72 hours  Cardiac Enzymes No results found for this basename: CK:3,CKMB:3,TROPONINI:3,MYOGLOBIN:3 in the last 168 hours ------------------------------------------------------------------------------------------------------------------ No components found with this basename: POCBNP:3  Micro Results No results found for this or any previous visit (from the past 240 hour(s)).  Radiology Reports Mr Lumbar Spine Wo Contrast  11/05/2011  *RADIOLOGY REPORT*  Clinical Data: 56 year old female with low back pain radiating to the right groin.  MRI LUMBAR SPINE WITHOUT CONTRAST  Technique:  Multiplanar and multiecho pulse sequences of  the lumbar spine were obtained without intravenous contrast.  Comparison: 09/19/2011 and earlier.  Findings: Stable vertebral height and alignment. No marrow edema or evidence of acute osseous abnormality.   Visualized lower thoracic spinal cord is normal with conus medularis at L1.  Visualized paraspinal soft tissues are within normal limits. Stable visualized abdominal viscera, including chronic right adrenal adenoma stable since 2006.  T10-T11:  Chronic posterior element hypertrophy.  No significant stenosis.  T11-T12:  Chronic anterior eccentric disc bulge and posterior element hypertrophy.  No significant stenosis.  T12-L1:  Negative.  L1-L2:  Negative.  L2-L3:  Mild disc desiccation and disc bulge.  No stenosis.  L3-L4:  Mild disc desiccation and disc bulge.  Stable mild to moderate facet hypertrophy.  No stenosis.  L4-L5:  Stable disc desiccation and disc bulge.  Moderate to severe facet hypertrophy, greater on the right, is stable.  Stable minimal to mild right L4 foraminal stenosis.  No spinal or  lateral recess stenosis.  L5-S1:  Minor disc desiccation.  Stable moderate facet hypertrophy. No stenosis.  IMPRESSION: Stable lumbar spine with no significant disc disease, and no spinal stenosis or convincing neural impingement.  There is chronic severe facet degeneration at L4-L5, greater on the right.  Original Report Authenticated By: Harley Hallmark, M.D.   Ct Abdomen Pelvis W Contrast  11/05/2011  *RADIOLOGY REPORT*  Clinical Data: Right lower quadrant and suprapubic pain.  CT ABDOMEN AND PELVIS WITH CONTRAST  Technique:  Multidetector CT imaging of the abdomen and pelvis was performed following the standard protocol during bolus administration of intravenous contrast.  Contrast: OMNIPAQUE IOHEXOL 300 MG/ML  SOLN  Comparison: 06/16/2009  Findings: Mild dependent changes in the lung bases.  Contrast material in the lower esophagus which may be due to reflux or dysmotility.  The  Sub centimeter low attenuation lesion in the lateral segment left lobe of the liver consistent with small cyst and stable since previous study.  Diffuse low attenuation change in the liver consistent with fatty infiltration.  Surgical absence of the gallbladder.  The pancreas, spleen, left adrenal gland, kidneys, abdominal aorta, and retroperitoneal lymph nodes are unremarkable. The stomach, small bowel, and colon are not abnormally distended. Diffusely stool filled colon.  Prominent visceral adipose tissues. No free air or free fluid in the abdomen. Left retroperitoneal calcifications most consistent with vascular calcifications.  No ureteral stones demonstrated.  Pelvis:  Bladder wall is not thickened.  Uterus appears surgically absent.  No abnormal adnexal masses.  The appendix is well seen and is normal.  No free or loculated pelvic fluid collections.  No evidence of diverticulitis.  Infiltration and gas in the subcutaneous fat over the low pelvic wall, likely representing sites of injection.  No significant pelvic  lymphadenopathy. Degenerative changes in the lumbar spine and hips.  IMPRESSION: No acute process demonstrated in the abdomen or pelvis.  Fatty infiltration of the liver.  Normal appendix.  Original Report Authenticated By: Marlon Pel, M.D.    Scheduled Meds:    . amLODipine  5 mg Oral Daily  . atorvastatin  10 mg Oral QHS  . budesonide-formoterol  2 puff Inhalation BID  . cloNIDine  0.1 mg Oral BID  . diltiazem  180 mg Oral Daily  . DULoxetine  60 mg Oral Daily  . enoxaparin  120 mg Subcutaneous Q12H  . fentaNYL  25 mcg Transdermal Q72H  . HYDROmorphone      . insulin aspart  0-9 Units Subcutaneous TID WC  .  loratadine  10 mg Oral Daily  . pantoprazole  40 mg Oral Q1200  . polyethylene glycol  17 g Oral Daily  . potassium chloride  10 mEq Intravenous Q1 Hr x 4  . potassium chloride  40 mEq Oral Once  . QUEtiapine  50 mg Oral BID  . warfarin  10 mg Oral ONCE-1800  . Warfarin - Pharmacist Dosing Inpatient   Does not apply q1800  . DISCONTD: simvastatin  20 mg Oral QHS   Continuous Infusions:    . sodium chloride 1,000 mL (11/05/11 1710)   PRN Meds:.albuterol, HYDROmorphone (DILAUDID) injection, ondansetron (ZOFRAN) IV, oxyCODONE-acetaminophen, DISCONTD:  HYDROmorphone (DILAUDID) injection, DISCONTD: oxyCODONE-acetaminophen

## 2011-11-06 NOTE — Evaluation (Signed)
Physical Therapy Evaluation Patient Details Name: [name] MRN: [id number] DOB: 1956/02/08 Today's Date: 11/06/2011 Time: 4098-1191 PT Time Calculation (min): 38 min  PT Assessment / Plan / Recommendation Clinical Impression  56 yo female with multiple medical issues including athritis, lupus, multiple surgeries on knees , wrists and neck, obesity , admitted with back pain.  She continues to have pain, but is able to get out of bed and ambulate with a wide RW. She will have some assist at D/C.  Feel she will be able to return to her apt with caregiver and benefirt from HHPT.  Not DME is needed    PT Assessment  All further PT needs can be met in the next venue of care    Follow Up Recommendations  Home health PT    Barriers to Discharge        Equipment Recommendations  None recommended by PT    Recommendations for Other Services     Frequency      Precautions / Restrictions Restrictions Weight Bearing Restrictions: No   Pertinent Vitals/Pain Pt reports pain a 9/10 that does not change with increase of activity      Mobility  Bed Mobility Bed Mobility: Supine to Sit Rolling Right: 7: Independent Details for Bed Mobility Assistance: pt limited by obesity and difficulty moving on hospital bed Transfers Transfers: Sit to Stand;Stand to Sit Sit to Stand: 6: Modified independent (Device/Increase time);From bed;From chair/3-in-1 Stand to Sit: 6: Modified independent (Device/Increase time);To chair/3-in-1 Ambulation/Gait Ambulation/Gait Assistance: 6: Modified independent (Device/Increase time) Assistive device: Rolling walker (wide) Ambulation/Gait Assistance Details:  pt is able to progress with RW. She is familiar with how to use one as she has used it before. Pt reports level of pain does not increase or decrease with ambulation Gait Pattern: Step-to pattern Gait velocity: decreased General Gait Details: pt using RW to help take some weight from RLE Stairs:  No Wheelchair Mobility Wheelchair Mobility: No    Exercises Other Exercises Other Exercises: encouraged to do trunk extension as able to lenghthen core Other Exercises: abd sets, glute sets Other Exercises: gentle back stretching in sitting with knee raises and shoulders foward.  Pt only able to toleratre small ranges of ROM in stretching Other Exercises: bilateral UE hip to hip for  core activation   PT Diagnosis: Difficulty walking;Abnormality of gait;Generalized weakness;Acute pain  PT Problem List: Decreased range of motion;Decreased activity tolerance;Decreased mobility;Obesity;Pain PT Treatment Interventions:     PT Goals Acute Rehab PT Goals PT [place] Formulation: With patient Time For [place] Achievement: 11/20/11 Potential to Achieve Goals: Good Pt will go Supine/Side to Sit: Independently PT [place]: Supine/Side to Sit - Progress: [place] set today Pt will go Sit to Supine/Side: Independently PT Zamorano: Sit to Supine/Side - Progress: Font set today Pt will go Sit to Stand: Independently PT Pryde: Sit to Stand - Progress: [place]man set today Pt will go Stand to Sit: Independently PT Gauss: Stand to Sit - Progress: [place] set today Pt will Ambulate: >150 feet;with modified independence;with least restrictive assistive device PT Cantu: Ambulate - Progress: [place] set today  Visit Information  Last PT Received On: 11/06/11 Assistance Needed: +1    Subjective Data  Subjective: pt reports her mom is currently admitted to [facility] Patient Stated Haff: to have pain go away   Prior Functioning  Home Living Lives With: Alone Available Help at Discharge: Personal care attendant (2 hours a day, 5 days a week) Type of Home: Apartment Home Access: Level  entry Home Layout: One level Home Adaptive Equipment: Wheelchair - manual;Walker - rolling;Bedside commode/3-in-1;Straight cane Additional Comments: pt has a rollator with 4 wheels Prior Function Level of Independence: Independent with  assistive device(s) Communication Communication: No difficulties    Cognition  Overall Cognitive Status: Appears within functional limits for tasks assessed/performed Arousal/Alertness: Awake/alert Orientation Level: Appears intact for tasks assessed Behavior During Session: [facility] for tasks performed    Extremity/Trunk Assessment Right Lower Extremity Assessment RLE ROM/Strength/Tone: Within functional levels RLE Sensation: WFL - Light Touch;WFL - Proprioception RLE Coordination: WFL - gross/fine motor Left Lower Extremity Assessment LLE ROM/Strength/Tone: Within functional levels LLE Sensation: WFL - Light Touch;WFL - Proprioception LLE Coordination: WFL - gross/fine motor Trunk Assessment Trunk Assessment: Lordotic;Other exceptions Trunk Exceptions: Pt obese with sharp angle of lordosis at low back   Balance Balance Balance Assessed: No  End of Session PT - End of Session Activity Tolerance: Patient limited by pain Patient left: in chair;with call bell/phone within reach  GP     Donnetta Hail 11/06/2011, 10:54 AM

## 2011-11-06 NOTE — Plan of Care (Addendum)
Problem: Problem: Pain Management Progression Griffy: PAIN MANAGEMENT ALTERNATIVE EFFECTIVE Outcome: Progressing Medicated with 2 mg. Dilaudid and heat. Pt. Appears to be vomiting immediately after eating, she states she was doing that at home also.

## 2011-11-06 NOTE — Progress Notes (Signed)
ANTICOAGULATION CONSULT NOTE - Follow-Up Consult  Pharmacy Consult for Warfarin Indication: Hx of DVT and Lupus  No Known Allergies  Patient Measurements: Height: 5\' 6"  (167.6 cm) Weight: 288 lb 9.3 oz (130.9 kg) IBW/kg (Calculated) : 59.3   Vital Signs: Temp: 98.4 F (36.9 C) (08/19 0546) Temp src: Oral (08/19 0546) BP: 146/91 mmHg (08/19 1001) Pulse Rate: 74  (08/19 0546)  Labs:  Basename 11/06/11 0340 11/04/11 2255 11/04/11 0438  HGB 10.4* 11.2* --  HCT 31.9* 33.4* 32.9*  PLT 295 358 348  APTT -- -- --  LABPROT 17.8* 15.5* 14.2  INR 1.44 1.20 1.08  HEPARINUNFRC -- -- --  CREATININE 0.78 0.66 0.62  CKTOTAL -- -- --  CKMB -- -- --  TROPONINI -- -- --    Estimated Creatinine Clearance: 109 ml/min (by C-G formula based on Cr of 0.78).   Medical History: Past Medical History  Diagnosis Date  . DVT (deep venous thrombosis)     Recurrent  . Hypertension   . Diabetes mellitus   . Adrenal mass 03/226    Benign  . COPD (chronic obstructive pulmonary disease)   . Lupus Anticoagulant Positive   . Clotting disorder     +beta-2-glycoprotein IgA antibody  . History of cocaine abuse     Remote history   . Arthritis     knees/multiple orthopedic conditons    Medications:  Scheduled:     . amLODipine  5 mg Oral Daily  . atorvastatin  10 mg Oral QHS  . budesonide-formoterol  2 puff Inhalation BID  . cloNIDine  0.1 mg Oral BID  . diltiazem  180 mg Oral Daily  . DULoxetine  60 mg Oral Daily  . enoxaparin  120 mg Subcutaneous Q12H  . fentaNYL  25 mcg Transdermal Q72H  . HYDROmorphone      . insulin aspart  0-9 Units Subcutaneous TID WC  . loratadine  10 mg Oral Daily  . pantoprazole  40 mg Oral Q1200  . polyethylene glycol  17 g Oral Daily  . potassium chloride  10 mEq Intravenous Q1 Hr x 4  . potassium chloride  40 mEq Oral Once  . QUEtiapine  50 mg Oral BID  . warfarin  10 mg Oral ONCE-1800  . warfarin  10 mg Oral ONCE-1800  . Warfarin - Pharmacist Dosing  Inpatient   Does not apply q1800  . DISCONTD: simvastatin  20 mg Oral QHS   Infusions:     . sodium chloride 1,000 mL (11/05/11 1710)    Assessment: 56 yo F on chronic warfarin for hx of DVT and Lupus. Home dose reported as 15mg  on TuThSat and 10mg  on MWFSun, reportedly last taken 8/17 per Med Rec. Based on [facility] notes, looks like patient had an epidural on 8/15. Plan was to stop warfarin 5 days pre-op and resume on POD#1 (8/16) warfarin 15mg  on TuThSat and 10mg  on MWFSun on and Lovenox 120mg  SQ q12h until INR is therapeutic. Pt was supposed to have her INR checked by Dr. Truett Perna on POD#5 (8/20).  INR remains SUBtherapeutic but trending up to 1.44 today after 1 dose on 8/18. Remains on Lovenox 120 q12hrs while INR < 2  [place] of Therapy:  INR 2-3 Monitor platelets by anticoagulation protocol: Yes   Plan:  1) Repeat Warfarin 10mg  PO x1 tonight 2) Daily INR 3) Will d/c Lovenox once INR =2.  Janace Litten, PharmD Pager: [phone] 11/06/2011,11:03 AM

## 2011-11-06 NOTE — Progress Notes (Signed)
Nutrition Brief Note  Received referral from PT regarding weight loss diet education interest from pt. Attempted to meet with pt however she requested I come back tomorrow. Provided weight loss diet education handout at bedside. Will attempt to follow up at a later date.   Dietitian# [phone]

## 2011-11-06 NOTE — ED Provider Notes (Signed)
Medical screening examination/treatment/procedure(s) were conducted as a shared visit with non-physician practitioner(s) and myself.  I personally evaluated the patient during the encounter.  Derwood Kaplan, MD 11/06/11 [phone]

## 2011-11-06 NOTE — Progress Notes (Signed)
Pt. Continues to complain of pain in her RT. Lower abdomen that continues down her leg.

## 2011-11-07 ENCOUNTER — Other Ambulatory Visit: Payer: Medicare Other | Admitting: Lab

## 2011-11-07 ENCOUNTER — Telehealth: Payer: Self-pay | Admitting: *Deleted

## 2011-11-07 DIAGNOSIS — M5430 Sciatica, unspecified side: Secondary | ICD-10-CM

## 2011-11-07 DIAGNOSIS — E119 Type 2 diabetes mellitus without complications: Secondary | ICD-10-CM

## 2011-11-07 DIAGNOSIS — I10 Essential (primary) hypertension: Secondary | ICD-10-CM

## 2011-11-07 DIAGNOSIS — E876 Hypokalemia: Secondary | ICD-10-CM

## 2011-11-07 DIAGNOSIS — R109 Unspecified abdominal pain: Secondary | ICD-10-CM

## 2011-11-07 LAB — GLUCOSE, CAPILLARY
Glucose-Capillary: 144 mg/dL — ABNORMAL HIGH (ref 70–99)
Glucose-Capillary: 105 mg/dL — ABNORMAL HIGH (ref 70–99)

## 2011-11-07 LAB — PROTIME-INR
INR: 1.65 — ABNORMAL HIGH (ref 0.00–1.49)
Prothrombin Time: 19.8 seconds — ABNORMAL HIGH (ref 11.6–15.2)

## 2011-11-07 MED ORDER — FENTANYL 50 MCG/HR TD PT72: 1.0000 | MEDICATED_PATCH | TRANSDERMAL | Status: AC

## 2011-11-07 MED ORDER — WARFARIN SODIUM 7.5 MG PO TABS
15.0000 mg | ORAL_TABLET | Freq: Once | ORAL | Status: DC
  Filled 2011-11-07: qty 2

## 2011-11-07 NOTE — Telephone Encounter (Signed)
Message from pt stating she is in the hospital, asking if her PT INR can be checked while she is there. Made Dr. Truett Perna aware. Informed pt that her labs are being monitored by the hospital MDs while she is inpt. She voiced understanding.

## 2011-11-07 NOTE — Discharge Summary (Signed)
[facility]                                                                                   [name], is a 56 y.o. female  DOB 1955/04/04  MRN [id number].  Admission date:  11/04/2011  Discharge Date:  11/07/2011  Primary MD  Dorrene German, MD  Admitting Physician  Massie Maroon, MD  Admission Diagnosis  Abdominal pain [789.00] abdominal pain   Discharge Diagnosis     Principal Problem:  *Sciatica Active Problems:  Abdominal pain  Hypokalemia  Type II or unspecified type diabetes mellitus without mention of complication, not stated as uncontrolled  HTN (hypertension)    Past Medical History  Diagnosis Date  . DVT (deep venous thrombosis)     Recurrent  . Hypertension   . Diabetes mellitus   . Adrenal mass 03/226    Benign  . COPD (chronic obstructive pulmonary disease)   . Lupus Anticoagulant Positive   . Clotting disorder     +beta-2-glycoprotein IgA antibody  . History of cocaine abuse     Remote history   . Arthritis     knees/multiple orthopedic conditons    Past Surgical History  Procedure Date  . Carpal tunnel release     Bilateral  . Abdominal hysterectomy 1989    Fibroids  . Back surgery     cervical spine---disk disease  . Cesarean section   . Replacement total knee 11/17/2008    Left knee     Recommendations for primary care physician for things to follow:   Follow her INR closely stopped Lovenox once INR reaches 2. Please have her follow with orthopedic surgery and neurosurgery within a week.   Discharge Diagnoses:   Principal Problem:  *Sciatica Active Problems:  Abdominal pain  Hypokalemia  Type II or unspecified type diabetes mellitus without mention of complication, not stated as uncontrolled  HTN (hypertension)    Discharge Condition: Stable   Diet recommendation: See Discharge Instructions below   Consults neurosurgery over the phone x2   History of present illness and  Hospital Course:  See  H&P, Labs, Consult and Test reports for all details in brief, patient was admitted for acute on chronic right lower back pain radiating to the front of her her right leg up to the right knee, she has history of degenerative disc disease in her L. spine and follows with Dr. Mikal Plane on a close basis. She recently received a steroid shot in her spine at her neurosurgeons office and reported that her pain got acutely worse after that. Patient had CT scan of abdomen pelvis, MRI of L-spine, MRI of her right hip which did not show any acute changes except severe DJD in L-spine and possible bilateral trochanteric bursitis along with tendinitis in both hips please see MRI report. Her pain is much better after fentanyl patch will be continued along with her previous pain medications.  He shouldn't was seen here by physical therapy she was able to ambulate with a walker which she does at home, she had no weakness on exam, no signs of cauda equina syndrome, no anemia paresthesia no bowel or bladder incontinence  which are new, he does have chronic cough and use urine leak.  Case was discussed with neurosurgery Dr. Mikal Plane x2, then nothing to offer in inpatient setting and wanted to see the patient again in the office. I will set her up with orthopedic surgery also for the new finding of bursitis although I do not think her pain is coming from bursitis. Her pain typically will represent L3 nerve irritation from her L-spine disc disease.    Patient has history of diabetes mellitus type 2, her Glucophage was held while she was in the hospital at she received IV contrast few days ago, this will be resumed once she goes home has 48-hour period is over, her last A1c was 6.7 and glycemic control was reasonable.   Agent has history of DVTs for which she is on home Coumadin, her Coumadin was recently held at home for her L-spine steroid shot, she has been on Lovenox, Coumadin has been resumed as per the instructions of Dr.  Sherre Lain hematologist who follows her in his Coumadin clinic, I have resumed her Coumadin last INR is 1.6, will follow with primary care physician and Dr. Truett Perna in 2-3 days, he stop her Lovenox once INR reaches 2.    Today   Subjective:   [name] today has no headache,no chest abdominal pain,no new weakness tingling or numbness, feels much better wants to go home today.   Objective:   Blood pressure 144/84, pulse 84, temperature 98.4 F (36.9 C), temperature source Oral, resp. rate 20, height 5\' 6"  (1.676 m), weight 130.9 kg (288 lb 9.3 oz), SpO2 100.00%.   Intake/Output Summary (Last 24 hours) at 11/07/11 0942 Last data filed at 11/06/11 1800  Gross per 24 hour  Intake   1180 ml  Output    801 ml  Net    379 ml    Exam Awake Alert, Oriented *3, No new F.N deficits, Normal affect [place].AT,PERRAL Supple Neck,No JVD, No cervical lymphadenopathy appriciated.  Symmetrical Chest wall movement, Good air movement bilaterally, CTAB RRR,No Gallops,Rubs or new Murmurs, No Parasternal Heave +ve B.Sounds, Abd Soft, Non tender, No organomegaly appriciated, No rebound -guarding or rigidity. No Cyanosis, Clubbing or edema, No new Rash or bruise  Data Review    Dg Hip Complete Right  11/05/2011  *RADIOLOGY REPORT*  Clinical Data: 56 year old female with right hip pain.  No known trauma.  RIGHT HIP - COMPLETE 2+ VIEW  Comparison: CT pelvis from earlier the same day.  Findings: Femoral heads normally located.  Joint spaces are preserved.  On the AP view of the pelvis there is subtle suggestion of disruption of the trabecula at the level of the proximal right femoral neck.  This finding is not correlated on the dedicated AP and frog-leg lateral right proximal femur views.  The pelvis appears intact.  Proximal left femur appears intact. Pelvic phleboliths.  Sacral ala and SI joints appear within normal limits.  IMPRESSION: Subtle lucency through the right femoral neck on only the AP view of the  pelvis.  When correlated with the earlier CT pelvis from today, I favor this is artifact. I would recommend conservative treatment at this time, and if hip pain increases or does not improve, a follow-up right hip MRI to exclude insufficiency fracture.  Original Report Authenticated By: Harley Hallmark, M.D.   Mr Lumbar Spine Wo Contrast  11/05/2011  *RADIOLOGY REPORT*  Clinical Data: 56 year old female with low back pain radiating to the right groin.  MRI LUMBAR SPINE WITHOUT  CONTRAST  Technique:  Multiplanar and multiecho pulse sequences of the lumbar spine were obtained without intravenous contrast.  Comparison: 09/19/2011 and earlier.  Findings: Stable vertebral height and alignment. No marrow edema or evidence of acute osseous abnormality.   Visualized lower thoracic spinal cord is normal with conus medularis at L1.  Visualized paraspinal soft tissues are within normal limits. Stable visualized abdominal viscera, including chronic right adrenal adenoma stable since 2006.  T10-T11:  Chronic posterior element hypertrophy.  No significant stenosis.  T11-T12:  Chronic anterior eccentric disc bulge and posterior element hypertrophy.  No significant stenosis.  T12-L1:  Negative.  L1-L2:  Negative.  L2-L3:  Mild disc desiccation and disc bulge.  No stenosis.  L3-L4:  Mild disc desiccation and disc bulge.  Stable mild to moderate facet hypertrophy.  No stenosis.  L4-L5:  Stable disc desiccation and disc bulge.  Moderate to severe facet hypertrophy, greater on the right, is stable.  Stable minimal to mild right L4 foraminal stenosis.  No spinal or lateral recess stenosis.  L5-S1:  Minor disc desiccation.  Stable moderate facet hypertrophy. No stenosis.  IMPRESSION: Stable lumbar spine with no significant disc disease, and no spinal stenosis or convincing neural impingement.  There is chronic severe facet degeneration at L4-L5, greater on the right.  Original Report Authenticated By: Harley Hallmark, M.D.   Ct Abdomen  Pelvis W Contrast  11/05/2011  *RADIOLOGY REPORT*  Clinical Data: Right lower quadrant and suprapubic pain.  CT ABDOMEN AND PELVIS WITH CONTRAST  Technique:  Multidetector CT imaging of the abdomen and pelvis was performed following the standard protocol during bolus administration of intravenous contrast.  Contrast: OMNIPAQUE IOHEXOL 300 MG/ML  SOLN  Comparison: 06/16/2009  Findings: Mild dependent changes in the lung bases.  Contrast material in the lower esophagus which may be due to reflux or dysmotility.  The  Sub centimeter low attenuation lesion in the lateral segment left lobe of the liver consistent with small cyst and stable since previous study.  Diffuse low attenuation change in the liver consistent with fatty infiltration.  Surgical absence of the gallbladder.  The pancreas, spleen, left adrenal gland, kidneys, abdominal aorta, and retroperitoneal lymph nodes are unremarkable. The stomach, small bowel, and colon are not abnormally distended. Diffusely stool filled colon.  Prominent visceral adipose tissues. No free air or free fluid in the abdomen. Left retroperitoneal calcifications most consistent with vascular calcifications.  No ureteral stones demonstrated.  Pelvis:  Bladder wall is not thickened.  Uterus appears surgically absent.  No abnormal adnexal masses.  The appendix is well seen and is normal.  No free or loculated pelvic fluid collections.  No evidence of diverticulitis.  Infiltration and gas in the subcutaneous fat over the low pelvic wall, likely representing sites of injection.  No significant pelvic lymphadenopathy. Degenerative changes in the lumbar spine and hips.  IMPRESSION: No acute process demonstrated in the abdomen or pelvis.  Fatty infiltration of the liver.  Normal appendix.  Original Report Authenticated By: Marlon Pel, M.D.   Mr Hip Right Wo Contrast  11/07/2011  *RADIOLOGY REPORT*  Clinical Data: Severe right hip pain.  MRI OF THE RIGHT HIP WITHOUT  CONTRAST  Technique:  Multiplanar, multisequence MR imaging was performed. No intravenous contrast was administered.  Comparison: Right hip radiographs 10/26/2011.  Findings: Both hips are normally located.  A mild symmetric hip joint degenerative changes bilaterally.  No findings for stress fracture or avascular necrosis.  No hip joint effusion.  No para-  articular fluid collections to suggest a paralabral cyst.  There is bilateral gluteus medius tendonitis and trochanteric bursitis.  The pubic symphysis and SI joints are intact.  No sacral stress fracture.  The surrounding hip and pelvic musculature is unremarkable.  No tear, myositis or fatty atrophy.  No significant intrapelvic abnormalities.  No inguinal mass or adenopathy.  IMPRESSION:  1.  No findings for stress fracture or avascular necrosis. 2.  Mild symmetric hip joint degenerative changes bilaterally. 3.  Bilateral gluteus medius tendonitis and trochanteric bursitis.   Original Report Authenticated By: P. Loralie Champagne, M.D.     Micro Results     CBC w Diff: Lab Results  Component Value Date   WBC 7.7 11/06/2011   WBC 4.3 10/16/2011   HGB 10.4* 11/06/2011   HGB 11.4* 10/16/2011   HCT 31.9* 11/06/2011   HCT 34.6* 10/16/2011   PLT 295 11/06/2011   PLT 322 10/16/2011   LYMPHOPCT 26 11/04/2011   LYMPHOPCT 40.5 10/16/2011   MONOPCT 8 11/04/2011   MONOPCT 7.9 10/16/2011   EOSPCT 0 11/04/2011   EOSPCT 2.1 10/16/2011   BASOPCT 0 11/04/2011   BASOPCT 0.5 10/16/2011    CMP: Lab Results  Component Value Date   NA 136 11/06/2011   K 4.1 11/06/2011   CL 100 11/06/2011   CO2 26 11/06/2011   BUN 18 11/06/2011   CREATININE 0.78 11/06/2011   GLU 114* 01/20/2008   PROT 7.3 11/06/2011   ALBUMIN 3.5 11/06/2011   BILITOT 0.1* 11/06/2011   ALKPHOS 96 11/06/2011   AST 37 11/06/2011   ALT 53* 11/06/2011  .  Lab Results  Component Value Date   INR 1.65* 11/07/2011   INR 1.44 11/06/2011   INR 1.20 11/04/2011   PROTIME 14.4* 11/01/2011   PROTIME 14.4* 10/16/2011    PROTIME 20.4* 09/12/2011     Discharge Instructions     Follow with Primary MD Fleet Contras A, MD in 2 days . You must stop her Lovenox once her INR reaches 2.  Get CBC, CMP, INR, checked 2 days by Primary MD and again as instructed by your Primary MD.    Get Medicines reviewed and adjusted.  Please request your Prim.MD to go over all Hospital Tests and Procedure/Radiological results at the follow up, please get all Hospital records sent to your Prim MD by signing hospital release before you go home.  Activity: As tolerated with Full fall precautions use walker/cane & assistance as needed   Diet:  Heart healthy low carbohydrate   For Heart failure patients - Check your Weight same time everyday, if you gain over 2 pounds, or you develop in leg swelling, experience more shortness of breath or chest pain, call your Primary MD immediately. Follow Cardiac Low Salt Diet and 1.8 lit/day fluid restriction.  Disposition Home    If you experience worsening of your admission symptoms, develop shortness of breath, life threatening emergency, suicidal or homicidal thoughts you must seek medical attention immediately by calling 911 or calling your MD immediately  if symptoms less severe.  You Must read complete instructions/literature along with all the possible adverse reactions/side effects for all the Medicines you take and that have been prescribed to you. Take any new Medicines after you have completely understood and accpet all the possible adverse reactions/side effects.   Do not drive if your were admitted for syncope or siezures until you have seen by Primary MD or a Neurologist and advised to drive.  Do not drive when taking Pain  medications.    Do not take more than prescribed Pain, Sleep and Anxiety Medications  Special Instructions: If you have smoked or chewed Tobacco  in the last 2 yrs please stop smoking, stop any regular Alcohol  and or any Recreational drug use.  Wear Seat  belts while driving.     Follow-up Information    Follow up with AVBUERE,EDWIN A, MD. Schedule an appointment as soon as possible for a visit in 2 days.   Contact information:   [address] [place] [address] [phone]       Follow up with Thornton Papas, MD. Schedule an appointment as soon as possible for a visit in 3 days.   Contact information:   [address] [place] [address] [phone]       Follow up with Nadara Mustard, MD. Schedule an appointment as soon as possible for a visit in 1 week.   Contact information:   [address] [place] [address] [phone]       Follow up with CABBELL,KYLE L, MD. Schedule an appointment as soon as possible for a visit in 1 week.   Contact information:   [address] [place] [address] [phone]            Discharge Medications   Medication List  As of 11/07/2011  9:42 AM   START taking these medications         fentaNYL 50 MCG/HR   Commonly known as: DURAGESIC - dosed mcg/hr   Place 1 patch (50 mcg total) onto the skin every 3 (three) days.         CONTINUE taking these medications         albuterol 108 (90 BASE) MCG/ACT inhaler   Commonly known as: PROVENTIL HFA;VENTOLIN HFA      amLODipine 5 MG tablet   Commonly known as: NORVASC      budesonide-formoterol 80-4.5 MCG/ACT inhaler   Commonly known as: SYMBICORT      cloNIDine 0.1 MG tablet   Commonly known as: CATAPRES      diltiazem 180 MG 24 hr capsule   Commonly known as: CARDIZEM CD      DULoxetine 60 MG capsule   Commonly known as: CYMBALTA      enoxaparin 120 MG/0.8ML injection   Commonly known as: LOVENOX      loratadine 10 MG tablet   Commonly known as: CLARITIN      metFORMIN 500 MG tablet   Commonly known as: GLUCOPHAGE      omeprazole 20 MG capsule   Commonly known as: PRILOSEC      oxyCODONE-acetaminophen 5-325 MG per tablet     Commonly known as: PERCOCET/ROXICET   Take 1 tablet by mouth every 6 (six) hours as needed for pain. May take 2 tablets PO q 6 hours for severe pain - Do not take with Tylenol as this tablet already contains tylenol      polyethylene glycol packet   Commonly known as: MIRALAX / GLYCOLAX      QUEtiapine 50 MG tablet   Commonly known as: SEROQUEL      simvastatin 20 MG tablet   Commonly known as: ZOCOR      warfarin 5 MG tablet   Commonly known as: COUMADIN         STOP taking these medications         diclofenac 75 MG EC tablet  Where to get your medications    These are the prescriptions that you need to pick up.   You may get these medications from any pharmacy.         fentaNYL 50 MCG/HR               Total Time in preparing paper work, data evaluation and todays exam - 35 minutes  Leroy Sea M.D on 11/07/2011 at 9:42 AM  [facility] Office  [phone]

## 2011-11-07 NOTE — Plan of Care (Signed)
Problem: Food- and Nutrition-Related Knowledge Deficit (NB-1.1) [place]: Nutrition education Formal process to instruct or train a patient/client in a skill or to impart knowledge to help patients/clients voluntarily manage or modify food choices and eating behavior to maintain or improve health.  Outcome: Completed/Met Date Met:  11/07/11 Met with pt and discussed weight loss nutrition education. Evaluated pt's current dietary habits and areas for improvement. Discussed foods/bevereages high in calories, fat, and sodium, and healthy alternatives. Discussed portion sizes. Pt asked appropriate questions. Provided pt with phone number for outpatient DM classes at [facility]. Provided pt with handout of weight loss diet instructions. Pt expressed understanding. Expect good compliance.

## 2011-11-07 NOTE — Progress Notes (Signed)
Pt D/C home , pt is alert and oriented, no new complains. Vitals are within normal value for patient. D/C instructions and medication administration instructions done. Pt verbalizes understanding.

## 2011-11-07 NOTE — Progress Notes (Signed)
ANTICOAGULATION CONSULT NOTE - Follow-Up Consult  Pharmacy Consult for Warfarin Indication: Hx of DVT and Lupus  No Known Allergies  Patient Measurements: Height: 5\' 6"  (167.6 cm) Weight: 288 lb 9.3 oz (130.9 kg) IBW/kg (Calculated) : 59.3   Vital Signs: Temp: 98.4 F (36.9 C) (08/20 0542) Temp src: Oral (08/20 0542) BP: 144/84 mmHg (08/20 0542) Pulse Rate: 84  (08/20 0542)  Labs:  Basename 11/07/11 0351 11/06/11 0340 11/04/11 2255  HGB -- 10.4* 11.2*  HCT -- 31.9* 33.4*  PLT -- 295 358  APTT -- -- --  LABPROT 19.8* 17.8* 15.5*  INR 1.65* 1.44 1.20  HEPARINUNFRC -- -- --  CREATININE -- 0.78 0.66  CKTOTAL -- -- --  CKMB -- -- --  TROPONINI -- -- --    Estimated Creatinine Clearance: 109 ml/min (by C-G formula based on Cr of 0.78).   Medical History: Past Medical History  Diagnosis Date  . DVT (deep venous thrombosis)     Recurrent  . Hypertension   . Diabetes mellitus   . Adrenal mass 03/226    Benign  . COPD (chronic obstructive pulmonary disease)   . Lupus Anticoagulant Positive   . Clotting disorder     +beta-2-glycoprotein IgA antibody  . History of cocaine abuse     Remote history   . Arthritis     knees/multiple orthopedic conditons    Medications:  Scheduled:     . amLODipine  5 mg Oral Daily  . atorvastatin  10 mg Oral QHS  . budesonide-formoterol  2 puff Inhalation BID  . cloNIDine  0.1 mg Oral BID  . diltiazem  180 mg Oral Daily  . DULoxetine  60 mg Oral Daily  . enoxaparin  120 mg Subcutaneous Q12H  . fentaNYL  25 mcg Transdermal Q72H  . insulin aspart  0-9 Units Subcutaneous TID WC  . loratadine  10 mg Oral Daily  . pantoprazole  40 mg Oral Q1200  . polyethylene glycol  17 g Oral Daily  . QUEtiapine  50 mg Oral BID  . warfarin  10 mg Oral ONCE-1800  . Warfarin - Pharmacist Dosing Inpatient   Does not apply q1800   Infusions:     Assessment: 56 yo F on chronic warfarin for hx of DVT and Lupus. Home dose reported as 15mg  on  TuThSat and 10mg  on MWFSun, reportedly last taken 8/17 per Med Rec. Based on [facility] notes, looks like patient had an epidural on 8/15. Plan was to stop warfarin 5 days pre-op and resume on POD#1 (8/16) warfarin 15mg  on TuThSat and 10mg  on MWFSun on and Lovenox 120mg  SQ q12h until INR is therapeutic. Pt was supposed to have her INR checked by Dr. Truett Perna on POD#5 (8/20).  INR remains SUBtherapeutic but trending up nicely to 1.65 today after 2 doses of 10 mg. Remains on Lovenox 120 q12hrs while INR < 2  Quilter of Therapy:  INR 2-3 Monitor platelets by anticoagulation protocol: Yes   Plan:  1) Warfarin 15 mg PO x1 tonight 2) Daily INR 3) Will d/c Lovenox once INR =2.  Janace Litten, PharmD Pager: [phone] 11/07/2011,8:26 AM

## 2011-11-08 ENCOUNTER — Telehealth: Payer: Self-pay | Admitting: *Deleted

## 2011-11-08 ENCOUNTER — Other Ambulatory Visit: Payer: Self-pay | Admitting: *Deleted

## 2011-11-08 ENCOUNTER — Other Ambulatory Visit: Payer: Self-pay | Admitting: Lab

## 2011-11-08 DIAGNOSIS — I82409 Acute embolism and thrombosis of unspecified deep veins of unspecified lower extremity: Secondary | ICD-10-CM

## 2011-11-08 NOTE — Telephone Encounter (Signed)
Received call from pt explaining that she has been released from hospital 11/07/11, which she went in for Abd pain, back pain.  They instructed her to see MD within 3 days after discharge and she wanted to make an appt.  Per Dr. Truett Perna, pt should keep appt 9/19 with Lonna Cobb, NP and have repeat labs on Monday 11/13/11.  Returned call to patient and information given, pt verbalized understanding.

## 2011-11-10 ENCOUNTER — Telehealth: Payer: Self-pay | Admitting: Oncology

## 2011-11-10 LAB — PROTIME-INR

## 2011-11-10 NOTE — Telephone Encounter (Signed)
S/w the pt and she is aware of her lab appt on Monday 11/13/2011

## 2011-11-13 ENCOUNTER — Other Ambulatory Visit (HOSPITAL_BASED_OUTPATIENT_CLINIC_OR_DEPARTMENT_OTHER): Payer: Medicare Other | Admitting: Lab

## 2011-11-13 DIAGNOSIS — I82409 Acute embolism and thrombosis of unspecified deep veins of unspecified lower extremity: Secondary | ICD-10-CM

## 2011-11-13 LAB — PROTIME-INR
INR: 3.1 (ref 2.00–3.50)
Protime: 37.2 Seconds — ABNORMAL HIGH (ref 10.6–13.4)

## 2011-11-16 ENCOUNTER — Telehealth: Payer: Self-pay | Admitting: *Deleted

## 2011-11-16 ENCOUNTER — Other Ambulatory Visit (HOSPITAL_COMMUNITY): Payer: Self-pay | Admitting: Neurosurgery

## 2011-11-16 ENCOUNTER — Other Ambulatory Visit: Payer: Self-pay | Admitting: Neurosurgery

## 2011-11-16 DIAGNOSIS — M545 Low back pain, unspecified: Secondary | ICD-10-CM

## 2011-11-16 NOTE — Telephone Encounter (Signed)
Message copied by Gala Romney on Thu Nov 16, 2011  5:34 PM ------      Message from: Ladene Artist      Created: Tue Nov 14, 2011 10:45 PM       Please call patient,  PT is therapeutic, same coumadin, check PT in 1 week

## 2011-11-16 NOTE — Telephone Encounter (Signed)
Called and spoke with pt, Per Dr. Truett Perna PT is therapeutic and to stay on same dosage.  Pt able to voice back she takes Coumadin 10mg  M-W-F & Sun then 15mg  the other days.  Pt also informed that scheduling will call to set up lab appt to be done in 1 week.  Pt verbalized understanding

## 2011-11-23 ENCOUNTER — Encounter (HOSPITAL_COMMUNITY): Payer: Self-pay | Admitting: Pharmacy Technician

## 2011-11-27 ENCOUNTER — Encounter: Payer: Self-pay | Admitting: Oncology

## 2011-11-27 NOTE — Progress Notes (Signed)
[name] [phone] for lovenox 120mg  pa, should receive response within 24-72 hours.

## 2011-11-28 ENCOUNTER — Encounter: Payer: Self-pay | Admitting: Oncology

## 2011-11-28 NOTE — Progress Notes (Signed)
[employer] approved lovneox 120mg  24/30 from 11/26/12-11/26/12.

## 2011-11-30 ENCOUNTER — Ambulatory Visit (HOSPITAL_COMMUNITY)
Admission: RE | Admit: 2011-11-30 | Discharge: 2011-11-30 | Disposition: A | Discharge status: Home / Self Care | Payer: Medicare Other | Source: Ambulatory Visit | Attending: Neurosurgery | Admitting: Neurosurgery

## 2011-11-30 DIAGNOSIS — M545 Low back pain, unspecified: Secondary | ICD-10-CM

## 2011-11-30 DIAGNOSIS — E279 Disorder of adrenal gland, unspecified: Secondary | ICD-10-CM | POA: Insufficient documentation

## 2011-11-30 DIAGNOSIS — M79609 Pain in unspecified limb: Secondary | ICD-10-CM | POA: Insufficient documentation

## 2011-11-30 DIAGNOSIS — M25559 Pain in unspecified hip: Secondary | ICD-10-CM | POA: Insufficient documentation

## 2011-11-30 DIAGNOSIS — R109 Unspecified abdominal pain: Secondary | ICD-10-CM | POA: Insufficient documentation

## 2011-11-30 LAB — GLUCOSE, CAPILLARY: Glucose-Capillary: 104 mg/dL — ABNORMAL HIGH (ref 70–99)

## 2011-11-30 LAB — PROTIME-INR
INR: 1.02 (ref 0.00–1.49)
Prothrombin Time: 13.6 seconds (ref 11.6–15.2)

## 2011-11-30 MED ORDER — OXYCODONE-ACETAMINOPHEN 5-325 MG PO TABS
1.0000 | ORAL_TABLET | ORAL | Status: DC | PRN
  Administered 2011-11-30 (×2): 1 via ORAL
  Filled 2011-11-30: qty 1

## 2011-11-30 MED ORDER — ONDANSETRON HCL 4 MG/2ML IJ SOLN: 4.0000 mg | Freq: Four times a day (QID) | INTRAMUSCULAR | Status: DC | PRN

## 2011-11-30 MED ORDER — OXYCODONE-ACETAMINOPHEN 5-325 MG PO TABS
ORAL_TABLET | ORAL | Status: AC
  Filled 2011-11-30: qty 1

## 2011-11-30 MED ORDER — DIAZEPAM 5 MG PO TABS
10.0000 mg | ORAL_TABLET | Freq: Once | ORAL | Status: AC
  Administered 2011-11-30: 10 mg via ORAL
  Filled 2011-11-30: qty 2

## 2011-11-30 MED ORDER — IOHEXOL 180 MG/ML  SOLN
20.0000 mL | Freq: Once | INTRAMUSCULAR | Status: AC | PRN
  Administered 2011-11-30: 20 mL via INTRAVENOUS

## 2011-12-07 ENCOUNTER — Telehealth: Payer: Self-pay | Admitting: Oncology

## 2011-12-07 ENCOUNTER — Ambulatory Visit (HOSPITAL_BASED_OUTPATIENT_CLINIC_OR_DEPARTMENT_OTHER): Payer: Medicare Other | Admitting: Nurse Practitioner

## 2011-12-07 ENCOUNTER — Other Ambulatory Visit (HOSPITAL_BASED_OUTPATIENT_CLINIC_OR_DEPARTMENT_OTHER): Payer: Medicare Other | Admitting: Lab

## 2011-12-07 VITALS — BP 136/87 | HR 88 | Temp 98.7°F | Resp 22 | Ht 67.0 in | Wt 275.1 lb

## 2011-12-07 DIAGNOSIS — I82409 Acute embolism and thrombosis of unspecified deep veins of unspecified lower extremity: Secondary | ICD-10-CM

## 2011-12-07 DIAGNOSIS — Z7901 Long term (current) use of anticoagulants: Secondary | ICD-10-CM

## 2011-12-07 DIAGNOSIS — I82403 Acute embolism and thrombosis of unspecified deep veins of lower extremity, bilateral: Secondary | ICD-10-CM

## 2011-12-07 LAB — PROTIME-INR
INR: 1.9 — ABNORMAL LOW (ref 2.00–3.50)
Protime: 22.8 Seconds — ABNORMAL HIGH (ref 10.6–13.4)

## 2011-12-07 NOTE — Progress Notes (Signed)
OFFICE PROGRESS NOTE  Interval history:  Ms. Sugrue returns as scheduled. She reports a recent hospitalization with back pain. She underwent a myelogram. She was on Lovenox temporarily. She resumed Coumadin  approximately 4 days ago. She denies bleeding. No leg swelling or calf pain. No shortness of breath or chest pain.   Objective: Blood pressure 136/87, pulse 88, temperature 98.7 F (37.1 C), temperature source Oral, resp. rate 22, height 5\' 7"  (1.702 m), weight 275 lb 1.6 oz (124.785 kg).  Oropharynx is without thrush or ulceration. Lungs are clear. Regular cardiac rhythm. Abdomen is soft and nontender. Obese. Extremities are without edema. Calves are soft and nontender.  Lab Results: Lab Results  Component Value Date   WBC 7.7 11/06/2011   HGB 10.4* 11/06/2011   HCT 31.9* 11/06/2011   MCV 81.6 11/06/2011   PLT 295 11/06/2011    Chemistry:    Chemistry      Component Value Date/Time   NA 136 11/06/2011 0340   K 4.1 11/06/2011 0340   CL 100 11/06/2011 0340   CO2 26 11/06/2011 0340   BUN 18 11/06/2011 0340   CREATININE 0.78 11/06/2011 0340   GLU 114* 01/20/2008 1215      Component Value Date/Time   CALCIUM 8.9 11/06/2011 0340   ALKPHOS 96 11/06/2011 0340   AST 37 11/06/2011 0340   ALT 53* 11/06/2011 0340   BILITOT 0.1* 11/06/2011 0340       Studies/Results: Ct Lumbar Spine W Contrast  11/30/2011  *RADIOLOGY REPORT*  Clinical Data:  Right hip and leg pain extending to right abdomen.  LUMBAR MYELOGRAM AND POST MYELOGRAM CT  MYELOGRAM LUMBAR  Technique: Contrast was injected by Dr.condyle at the L2-3 level. Contrast was negotiated into the lumbar spine without difficulty.  Fluoroscopy time:  0.58 minutes  Comparison: 11/05/2011 MR.  Findings: No significant nerve root compression, spinal stenosis or abnormal motion between flexion and extension.  Minimal anterior slip of L4.  IMPRESSION: Minimal anterior slip of L4.  CT MYELOGRAPHY LUMBAR SPINE  Technique: Multidetector CT imaging of the  lumbar spine was performed following myelography.  Multiplanar CT image reconstructions were also generated.  Findings: 2.7 x 2.1 cm right adrenal mass without significant change compared to the 12/07/2009 MR abdomen exam. This is incompletely evaluated on present exam. Please see prior MR abdomen report.  Last fully open disc space is labeled L5-S1.  Present examination incorporates from T12-L1 through the S2 level.  Conus L1 level.  T12-L1 through L2-3 unremarkable.  L3-4:  Minimal facet joint degenerative changes.  L4-5:  Moderate facet joint degenerative changes greater on the right with bony overgrowth.  Minimal anterior slip of L4.  Mild bulge.  No significant spinal stenosis.  Mild bilateral foraminal narrowing.  L5-S1:  Mild facet joint degenerative changes.  Very mild bulge. Mild bilateral foraminal narrowing greater on the right.  IMPRESSION: L4-5 moderate facet joint degenerative changes greater on the right with bony overgrowth.  Minimal anterior slip of L4.  Mild bulge. No significant spinal stenosis.  Mild bilateral foraminal narrowing.  L5-S1 mild facet joint degenerative changes.  Very mild bulge. Mild bilateral foraminal narrowing greater on the right.  L3-4 minimal facet joint degenerative changes.  2.7 x 2.1 cm right adrenal mass without significant change compared to the 12/07/2009 MR abdomen exam.   Original Report Authenticated By: Fuller Canada, M.D.    Dg Myelogram Lumbar  11/30/2011  *RADIOLOGY REPORT*  Clinical Data:  Right hip and leg pain extending to right  abdomen.  LUMBAR MYELOGRAM AND POST MYELOGRAM CT  MYELOGRAM LUMBAR  Technique: Contrast was injected by Dr.condyle at the L2-3 level. Contrast was negotiated into the lumbar spine without difficulty.  Fluoroscopy time:  0.58 minutes  Comparison: 11/05/2011 MR.  Findings: No significant nerve root compression, spinal stenosis or abnormal motion between flexion and extension.  Minimal anterior slip of L4.  IMPRESSION: Minimal  anterior slip of L4.  CT MYELOGRAPHY LUMBAR SPINE  Technique: Multidetector CT imaging of the lumbar spine was performed following myelography.  Multiplanar CT image reconstructions were also generated.  Findings: 2.7 x 2.1 cm right adrenal mass without significant change compared to the 12/07/2009 MR abdomen exam. This is incompletely evaluated on present exam. Please see prior MR abdomen report.  Last fully open disc space is labeled L5-S1.  Present examination incorporates from T12-L1 through the S2 level.  Conus L1 level.  T12-L1 through L2-3 unremarkable.  L3-4:  Minimal facet joint degenerative changes.  L4-5:  Moderate facet joint degenerative changes greater on the right with bony overgrowth.  Minimal anterior slip of L4.  Mild bulge.  No significant spinal stenosis.  Mild bilateral foraminal narrowing.  L5-S1:  Mild facet joint degenerative changes.  Very mild bulge. Mild bilateral foraminal narrowing greater on the right.  IMPRESSION: L4-5 moderate facet joint degenerative changes greater on the right with bony overgrowth.  Minimal anterior slip of L4.  Mild bulge. No significant spinal stenosis.  Mild bilateral foraminal narrowing.  L5-S1 mild facet joint degenerative changes.  Very mild bulge. Mild bilateral foraminal narrowing greater on the right.  L3-4 minimal facet joint degenerative changes.  2.7 x 2.1 cm right adrenal mass without significant change compared to the 12/07/2009 MR abdomen exam.   Original Report Authenticated By: Fuller Canada, M.D.     Medications: I have reviewed the patient's current medications.  Assessment/Plan:  1. History of bilateral lower extremity deep vein thrombosis. She continues Coumadin. Current Coumadin dose is 15 mg on Tuesday, Thursday and Saturday and 10 mg on Monday, Wednesday, Friday and Sunday. 2. History of a positive lupus anticoagulant. 3. History of a positive beta-2-glycoprotein IgA antibody. 4. Chronic bilateral leg pain - negative bilateral  Doppler January 2010. 5. History of hypertension. 6. COPD. 7. Status post hysterectomy. 8. Remote history of cocaine abuse. 9. History of an adrenal lesion on a CT scan in 2008 - status post a repeat CT on June 16, 2009, showing slow enlargement of the right adrenal nodule since 2006, most consistent with an adenoma. 10. History of rectal and vaginal bleeding in May 2009. She denies bleeding at today's visit. 11. Status post carpal tunnel surgery. 12. 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 Right knee replacement September 2011. 13. Multiple orthopedic conditions. 14. Placement of IVC filter preoperatively before knee replacement. The IVC filter was retrieved 03/01/2011. 15. Anemia. Ferritin in normal range at 28 on 10/16/2011. 16. Back pain. She recently underwent a myelogram. She has a followup visit with Dr. Franky Macho later this month.   Disposition-she will continue Coumadin at the current dose. She will return for a followup PT/INR in 1 week. We are making a referral to the Coumadin clinic at the [facility] for long-term management. She will return for a followup visit with Dr. Truett Perna in 9 months. She will contact the office in the interim with any problems. She will also contact the office if any surgery is planned.  Plan reviewed with Dr. Truett Perna.  Lonna Cobb ANP/GNP-BC

## 2011-12-07 NOTE — Telephone Encounter (Signed)
gv pt appt schedule September 2013 (CC) and June 2014 lb/fu.

## 2011-12-08 IMAGING — CT CT ABDOMEN WO/W CM
2 of 9 series · 12 of 46 positions shown, 19 images · IV contrast (agent unspecified)
Comparison: CT scan from 12/28/2005.

CLINICAL DATA: Right adrenal lesion

CT ABDOMEN WITHOUT AND WITH CONTRAST
TECHNIQUE: Multidetector CT imaging of the abdomen was performed
following the standard protocol before and during bolus
administration of intravenous contrast.
Contrast: 100 ml Cmnipaque-MDD

[Series 6: rtn a/p w/o · axial · non-contrast · 0.84mm/px · z∈[-292,-72]mm · 9 of 56 slices shown, 15 images]
[im 6/56  soft-tissue]
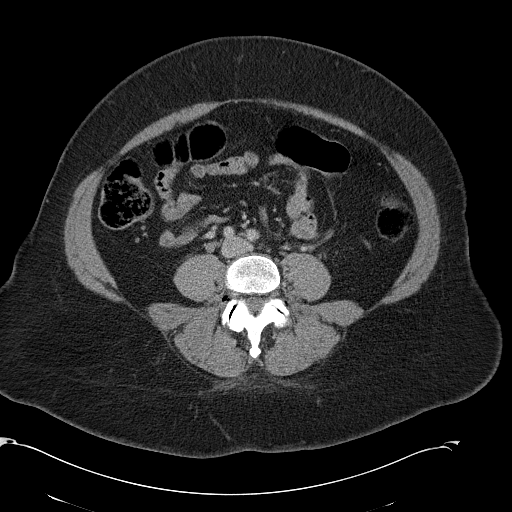
[im 6/56  bone]
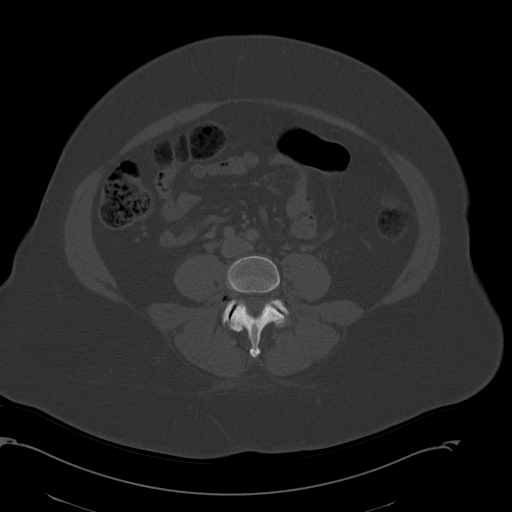
[im 12/56  soft-tissue]
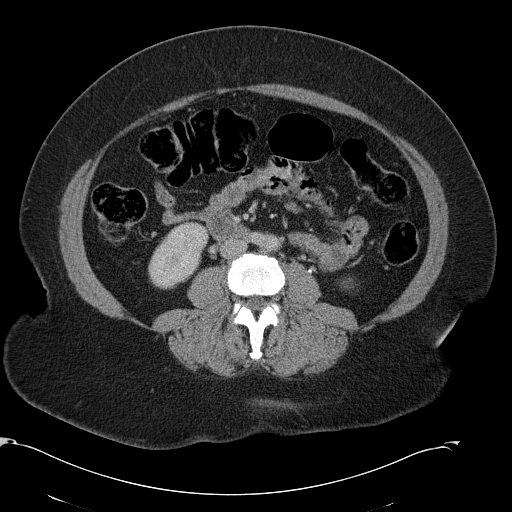
[im 17/56  soft-tissue]
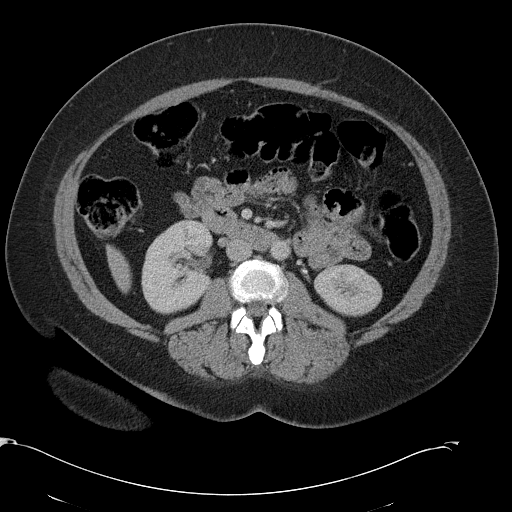
[im 23/56  soft-tissue]
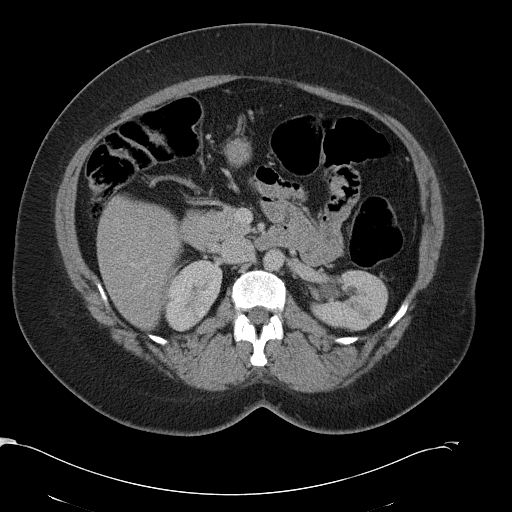
[im 28/56  soft-tissue]
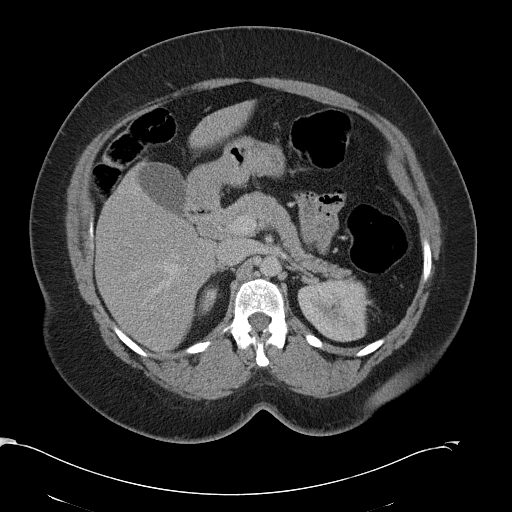
[im 34/56  soft-tissue]
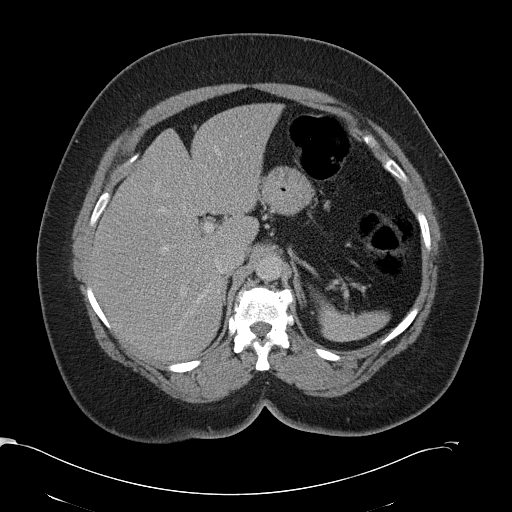
[im 34/56  lung]
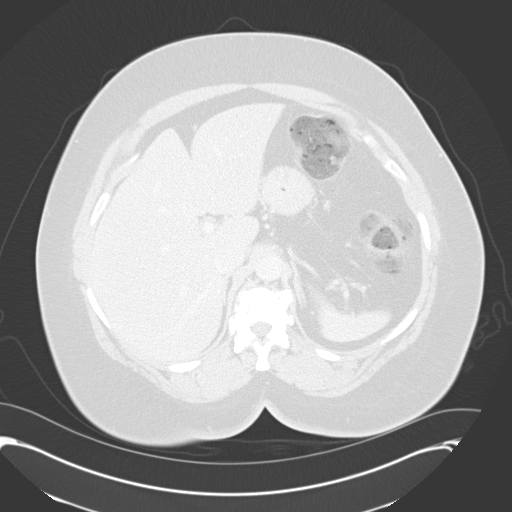
[im 39/56  soft-tissue]
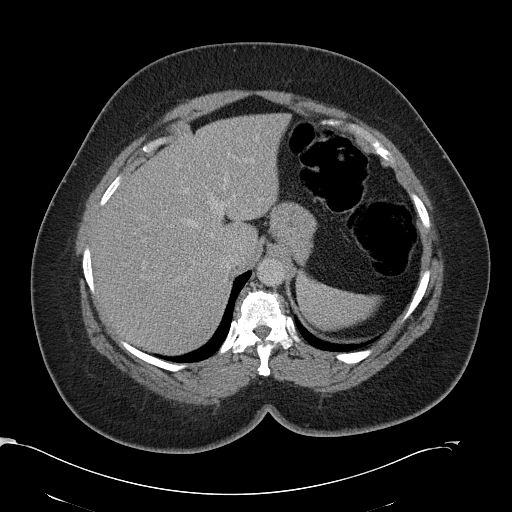
[im 39/56  lung]
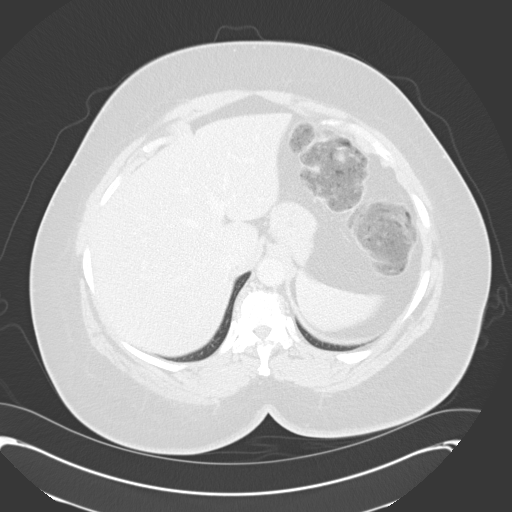
[im 45/56  soft-tissue]
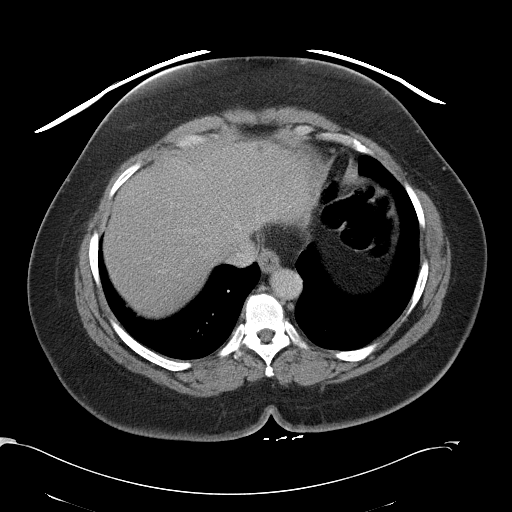
[im 45/56  lung]
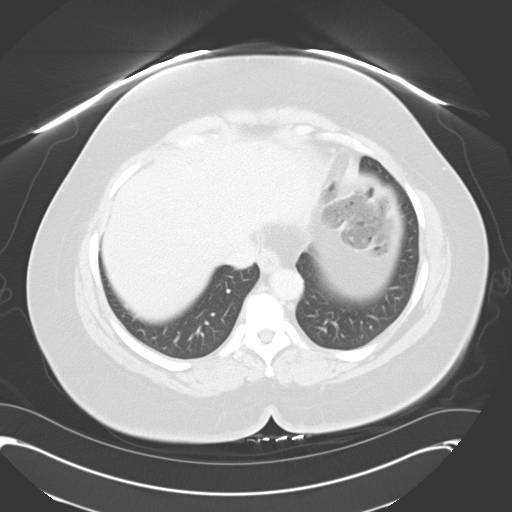
[im 50/56  soft-tissue]
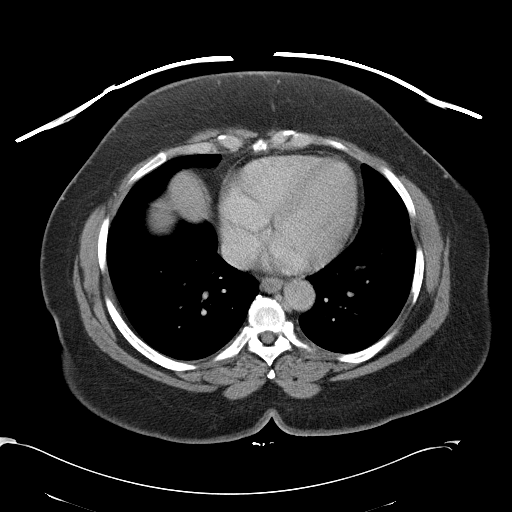
[im 50/56  lung]
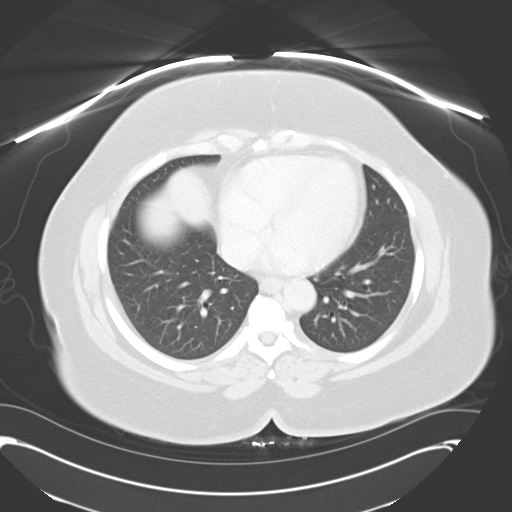
[im 50/56  bone]
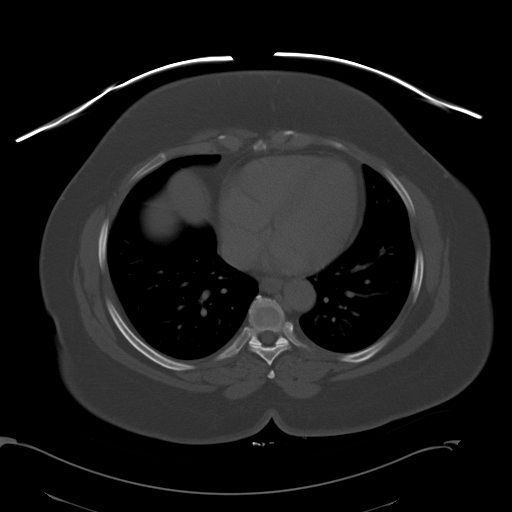

[Series 603: adrenal w/o · coronal · non-contrast · 0.84mm/px · 3 of 175 slices shown, 4 images]
[im 35/175  soft-tissue]
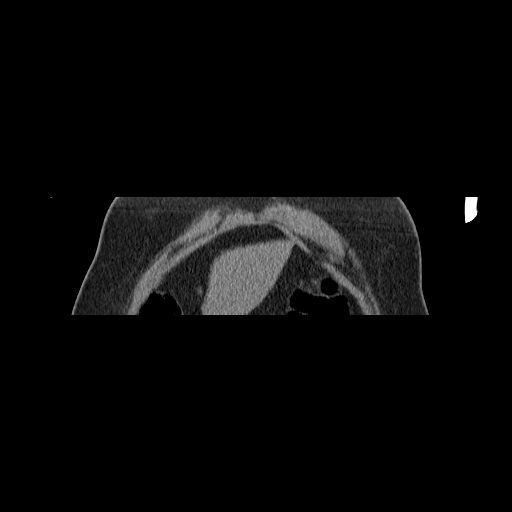
[im 70/175  soft-tissue]
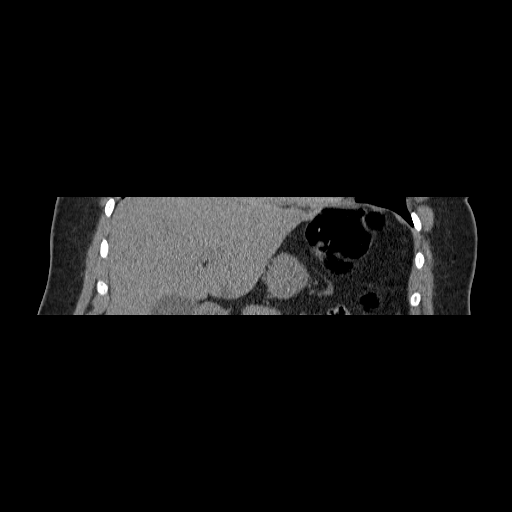
[im 70/175  bone]
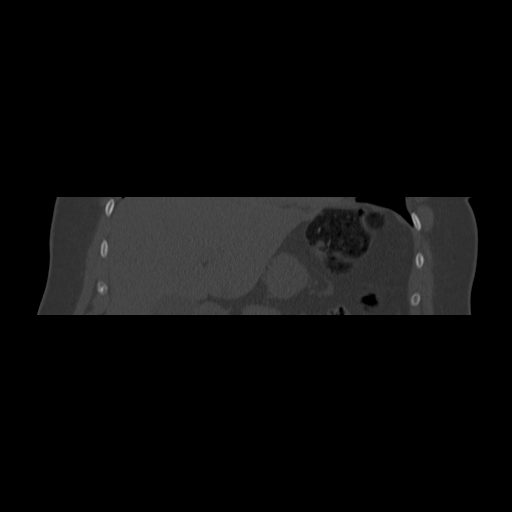
[im 105/175  soft-tissue]
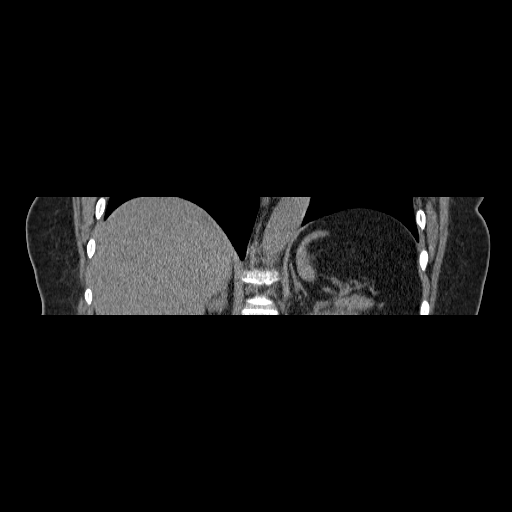

[12 of 46 positions shown; findings below may reference images not displayed]

FINDINGS: Right adrenal lesion measures 2.3 x 2.1 cm.  This has an
average attenuation of 23 HU before contrast, 77 HU on portal
venous phase imaging, and 53 HU on the 3-minute renal delay
sequence.  Based on these attenuation characteristics, the lesion
cannot be characterized as a adrenal adenoma.  However, however,
when comparing back to previous abdominal CTs, this lesion has been
enlarging slowly.  The right adrenal nodule measured 1.8 by 1.4 cm
on 07/02/2004 and it measured 2.1 x 1.8 cm on 02/12/2005.

Tiny left hepatic cyst is unchanged since 5881.  The spleen is
normal.  Small hiatal hernia noted.  Stomach is otherwise
unremarkable.  The duodenum, pancreas, gallbladder, left adrenal
gland, and abdominal bowel loops have normal imaging features.  No
evidence for free fluid or lymphadenopathy in the abdomen. Bone
windows reveal no worrisome lytic or sclerotic osseous lesions.
IMPRESSION: Slowly enlarging right adrenal nodule since 5881.  Attenuation
characteristics on today's study preclude characterization as a
typical adenoma.  While this could be a lipid poor adenoma, the
continued interval slowing increase in size does generate some
concern.  Dedicated adrenal MRI may be able to prove intralesional
fat which would suggest that this is a lipid poor adenoma.
However, given the continued slow progression in size, evaluation
of urine chemistries may prove helpful to assess for
pheochromocytoma.  Adrenal cortical carcinoma or metastatic disease
is considered less likely given the relatively slow progression

## 2011-12-12 ENCOUNTER — Other Ambulatory Visit: Payer: Self-pay | Admitting: Neurosurgery

## 2011-12-13 ENCOUNTER — Other Ambulatory Visit: Payer: Self-pay | Admitting: Pharmacist

## 2011-12-13 DIAGNOSIS — I82403 Acute embolism and thrombosis of unspecified deep veins of lower extremity, bilateral: Secondary | ICD-10-CM

## 2011-12-14 ENCOUNTER — Telehealth: Payer: Self-pay | Admitting: *Deleted

## 2011-12-14 NOTE — Telephone Encounter (Signed)
Needs clearance and coagulation guidance for surgery on 01/03/12. Will fax Dr. Kalman Drape guidelines to office with clearance signature and Dr. Franky Macho can customize to patient surgical needs . Day 5 Preop: Stop Coumadin (10/11) Day 3 Preop: Start Lovenox 120 mg sq every 12 hours (10/13) Day 1 Preop: Don't take Lovenox (stop) and come to [facility] for PT/INR check Post Procedure: Resume Lovenox 120 mg every 12 hours and resume Coumadin at current dose of 10 mg daily, except 15 mg T- TH-Sat and continue Lovenox until INR/PT is therapeutic Day 5 Postop: Come to [facility] for PT/INR check (10/21)  *copy given to coumadin clinic to review with patient at visit 12/15/11.

## 2011-12-15 ENCOUNTER — Other Ambulatory Visit (HOSPITAL_BASED_OUTPATIENT_CLINIC_OR_DEPARTMENT_OTHER): Payer: Medicare Other | Admitting: Lab

## 2011-12-15 ENCOUNTER — Ambulatory Visit (HOSPITAL_BASED_OUTPATIENT_CLINIC_OR_DEPARTMENT_OTHER): Payer: Medicare Other | Admitting: Pharmacist

## 2011-12-15 DIAGNOSIS — I82403 Acute embolism and thrombosis of unspecified deep veins of lower extremity, bilateral: Secondary | ICD-10-CM

## 2011-12-15 DIAGNOSIS — I82409 Acute embolism and thrombosis of unspecified deep veins of unspecified lower extremity: Secondary | ICD-10-CM

## 2011-12-15 DIAGNOSIS — Z5181 Encounter for therapeutic drug level monitoring: Secondary | ICD-10-CM

## 2011-12-15 DIAGNOSIS — Z7901 Long term (current) use of anticoagulants: Secondary | ICD-10-CM

## 2011-12-15 LAB — PROTIME-INR
INR: 3.5 (ref 2.00–3.50)
Protime: 42 Seconds — ABNORMAL HIGH (ref 10.6–13.4)

## 2011-12-15 NOTE — Progress Notes (Signed)
Pt seen for the first time in Coumadin Clinic.  She has a hx of bilateral DVTs and is lupus anticoagulant + and beta-2 glycoprotoin IgA Ab+. She has been on Coumadin x 2 years prior to referral to our services.  INR today = 3.5 on 10mg  daily except 15mg  on TuThSa.   Pt was recently off Coumadin for a myelogram while in the hospital (bridged with Lovenox).  She resumed this dose ~12 days ago. No problems with bleeding or bruising.  Medlist updated with recently filled medications.   Discussed Coumadin therapy at length with pt including the justification for choosing anticoagulation, the need for frequent and regular monitoring, precise dosage adjustment and compliance.  Side effects of potential bleeding were discussed.  The patient should avoid any OTC items containing aspirin or ibuprofen, and should avoid great swings in general diet.  Call if any signs of abnormal bleeding.  Pt communicated understanding.    Also, patient has been scheduled for spinal fusion on 01/03/12.  Darl Pikes, RN, has already provided a Lovenox Rx, which the patient has picked up.  Bridging instructions as follows were printed out for the patient:  Day 5 Preop: Stop Coumadin (10/11)  Day 3 Preop: Start Lovenox 120 mg sq every 12 hours (10/13)  Day 1 Preop: Don't take Lovenox (stop) and come to [facility] for PT/INR check  Post Procedure: Resume Lovenox 120 mg every 12 hours and resume Coumadin at current dose of 10 mg daily, except 15 mg T- TH-Sat and continue Lovenox until INR/PT is therapeutic  Day 5 Postop: Come to [facility] for PT/INR check (10/21)  Will decrease dose slightly to 10mg  daily except 15mg  on TuTh. Recheck INR in 1 week.  Pre-op INR check has been scheduled as well.

## 2011-12-18 ENCOUNTER — Other Ambulatory Visit: Payer: Self-pay | Admitting: *Deleted

## 2011-12-18 NOTE — Telephone Encounter (Signed)
Confirmed with [name] at Dr. Sueanne Margarita office that we will order patient's Lovenox for her. Left message for Trease to let us know if she needs Lovenox called in and to what pharmacy. Does she still have doses on hand from prior spinal injection procedure? If so, be sure they are in date and correct dose of 120 mg.

## 2011-12-21 ENCOUNTER — Encounter (HOSPITAL_COMMUNITY): Payer: Self-pay | Admitting: Pharmacy Technician

## 2011-12-22 ENCOUNTER — Other Ambulatory Visit (INDEPENDENT_AMBULATORY_CARE_PROVIDER_SITE_OTHER): Payer: Medicare Other | Admitting: Lab

## 2011-12-22 ENCOUNTER — Ambulatory Visit (HOSPITAL_BASED_OUTPATIENT_CLINIC_OR_DEPARTMENT_OTHER): Payer: Medicaid Other | Admitting: Pharmacist

## 2011-12-22 DIAGNOSIS — Z5181 Encounter for therapeutic drug level monitoring: Secondary | ICD-10-CM

## 2011-12-22 DIAGNOSIS — D649 Anemia, unspecified: Secondary | ICD-10-CM

## 2011-12-22 DIAGNOSIS — I82409 Acute embolism and thrombosis of unspecified deep veins of unspecified lower extremity: Secondary | ICD-10-CM

## 2011-12-22 DIAGNOSIS — Z7901 Long term (current) use of anticoagulants: Secondary | ICD-10-CM

## 2011-12-22 LAB — PROTIME-INR

## 2011-12-22 LAB — PROTHROMBIN TIME
INR: 3.75 — ABNORMAL HIGH (ref <1.50)
Prothrombin Time: 34.9 seconds — ABNORMAL HIGH (ref 11.6–15.2)

## 2011-12-22 NOTE — Progress Notes (Signed)
INR supratherapeutic today (3.75) despite decrease in dose (10mg  daily except 15mg  on TuTh) over last week. No problems with bleeding or bruising.  No changes.  Took medication exactly as instructed.   As mentioned in last note, patient is scheduled for spinal fusion on 01/03/12, and is due to stop her Coumadin in preparation for the procedure on 12/29/11. Will hold Coumadin today, then resume Coumadin at 10mg  daily.   Recheck INR in 4 days on 12/27/11 to ensure INR has decreased to normal range.

## 2011-12-25 ENCOUNTER — Encounter: Payer: Self-pay | Admitting: Oncology

## 2011-12-25 NOTE — Pre-Procedure Instructions (Signed)
[address] Sos[address]  12/25/2011   Your procedure is scheduled on:  Wednesday, October 16th  Report to [facility] at 0600 AM.  Call this number if you have problems the morning of surgery: [phone]   Remember:   Do not eat food or drink:After Midnight.  Take these medicines the morning of surgery with A SIP OF WATER: norvasc, clonidine, cardizem, cymbalta, neurontin, claritin, prilosec, percocet if needed, inhalers   Do not wear jewelry, make-up or nail polish.  Do not wear lotions, powders, or perfumes.   Do not shave 48 hours prior to surgery.   Do not bring valuables to the hospital.  Contacts, dentures or bridgework may not be worn into surgery.  Leave suitcase in the car. After surgery it may be brought to your room.  For patients admitted to the hospital, checkout time is 11:00 AM the day of discharge.   Patients discharged the day of surgery will not be allowed to drive home.  Special Instructions: Shower using CHG 2 nights before surgery and the night before surgery.  If you shower the day of surgery use CHG.  Use special wash - you have one bottle of CHG for all showers.  You should use approximately 1/3 of the bottle for each shower.   Please read over the following fact sheets that you were given: Pain Booklet, Coughing and Deep Breathing, Blood Transfusion Information, MRSA Information and Surgical Site Infection Prevention

## 2011-12-26 ENCOUNTER — Encounter (HOSPITAL_COMMUNITY)
Admission: RE | Admit: 2011-12-26 | Discharge: 2011-12-26 | Disposition: A | Discharge status: Home / Self Care | Payer: Medicare Other | Source: Ambulatory Visit | Attending: Neurosurgery | Admitting: Neurosurgery

## 2011-12-26 ENCOUNTER — Encounter (HOSPITAL_COMMUNITY): Payer: Self-pay

## 2011-12-26 ENCOUNTER — Telehealth: Payer: Self-pay | Admitting: *Deleted

## 2011-12-26 HISTORY — DX: Shortness of breath: R06.02

## 2011-12-26 HISTORY — DX: Gastro-esophageal reflux disease without esophagitis: K21.9

## 2011-12-26 HISTORY — DX: Cardiac murmur, unspecified: R01.1

## 2011-12-26 HISTORY — DX: Sleep apnea, unspecified: G47.30

## 2011-12-26 HISTORY — DX: Personal history of other medical treatment: Z92.89

## 2011-12-26 HISTORY — DX: Depression, unspecified: F32.A

## 2011-12-26 HISTORY — DX: Fibromyalgia: M79.7

## 2011-12-26 HISTORY — DX: Dependence on supplemental oxygen: Z99.81

## 2011-12-26 HISTORY — DX: Major depressive disorder, single episode, unspecified: F32.9

## 2011-12-26 HISTORY — DX: Dizziness and giddiness: R42

## 2011-12-26 LAB — BASIC METABOLIC PANEL
BUN: 10 mg/dL (ref 6–23)
CO2: 27 mEq/L (ref 19–32)
Calcium: 8.9 mg/dL (ref 8.4–10.5)
Chloride: 100 mEq/L (ref 96–112)
Creatinine, Ser: 0.56 mg/dL (ref 0.50–1.10)
GFR calc Af Amer: >90 mL/min (ref >90)
GFR calc non Af Amer: >90 mL/min (ref >90)
Glucose, Bld: 144 mg/dL — ABNORMAL HIGH (ref 70–99)
Potassium: 3.4 mEq/L — ABNORMAL LOW (ref 3.5–5.1)
Sodium: 139 mEq/L (ref 135–145)

## 2011-12-26 LAB — CBC
HCT: 35.1 % — ABNORMAL LOW (ref 36.0–46.0)
Hemoglobin: 11.3 g/dL — ABNORMAL LOW (ref 12.0–15.0)
MCH: 26.5 pg (ref 26.0–34.0)
MCHC: 32.2 g/dL (ref 30.0–36.0)
MCV: 82.2 fL (ref 78.0–100.0)
Platelets: 342 10*3/uL (ref 150–400)
RBC: 4.27 MIL/uL (ref 3.87–5.11)
RDW: 18 % — ABNORMAL HIGH (ref 11.5–15.5)
WBC: 6.3 10*3/uL (ref 4.0–10.5)

## 2011-12-26 LAB — SURGICAL PCR SCREEN
MRSA, PCR: NEGATIVE
Staphylococcus aureus: NEGATIVE

## 2011-12-26 NOTE — Telephone Encounter (Signed)
Having back surgery on 10/16. Had needed to have filter placed in past. Does she need to do this again?

## 2011-12-26 NOTE — Progress Notes (Signed)
Primary Physician - Dr. Jamal Collin, [address] Does not have a cardiologist - had stress test several years ago but unsure of when. No cardiac cath or echo. EKG from sept 2012 in epic

## 2011-12-27 ENCOUNTER — Other Ambulatory Visit (HOSPITAL_BASED_OUTPATIENT_CLINIC_OR_DEPARTMENT_OTHER): Payer: Medicare Other | Admitting: Lab

## 2011-12-27 ENCOUNTER — Telehealth: Payer: Self-pay | Admitting: *Deleted

## 2011-12-27 ENCOUNTER — Ambulatory Visit (HOSPITAL_BASED_OUTPATIENT_CLINIC_OR_DEPARTMENT_OTHER): Payer: Medicare Other | Admitting: Pharmacist

## 2011-12-27 ENCOUNTER — Telehealth: Payer: Self-pay | Admitting: Oncology

## 2011-12-27 DIAGNOSIS — I82409 Acute embolism and thrombosis of unspecified deep veins of unspecified lower extremity: Secondary | ICD-10-CM

## 2011-12-27 DIAGNOSIS — Z5181 Encounter for therapeutic drug level monitoring: Secondary | ICD-10-CM

## 2011-12-27 DIAGNOSIS — D649 Anemia, unspecified: Secondary | ICD-10-CM

## 2011-12-27 DIAGNOSIS — I82403 Acute embolism and thrombosis of unspecified deep veins of lower extremity, bilateral: Secondary | ICD-10-CM

## 2011-12-27 DIAGNOSIS — Z7901 Long term (current) use of anticoagulants: Secondary | ICD-10-CM

## 2011-12-27 LAB — PROTIME-INR
INR: 3.9 — ABNORMAL HIGH (ref 2.00–3.50)
Protime: 46.8 Seconds — ABNORMAL HIGH (ref 10.6–13.4)

## 2011-12-27 NOTE — Telephone Encounter (Signed)
gv pt appt for today lab and coumadin clinic.

## 2011-12-27 NOTE — Telephone Encounter (Signed)
Unable to reach pt at home #. Left message on voicemail for her to call office.

## 2011-12-27 NOTE — Progress Notes (Signed)
INR supratherapeutic today (3.9) despite holding Coumadin for 2 days, and pt decreasing her dose on her own to 5mg  on Sunday, Monday and 10mg  Tuesday. No changes in meds or diet.  No bleeding or bruising issues. Discussed case with Dr. Truett Perna.  Will have pt start to hold Coumadin today in preparation for procedure on 01/03/12.  She will be starting Lovenox 1mg /kg SQ BID on 12/31/11.   Will recheck INR on 01/02/12 prior to procedure.

## 2011-12-27 NOTE — Consult Note (Signed)
Anesthesia chart review: Patient is a 56 year old female scheduled for L4-5 anterior lumbar interbody fusion on 01/03/2012 by Dr. Franky Macho.  History includes morbid obesity, nonsmoker, diabetes mellitus type 2, hypercoagulable disorder (positive lupus anticoagulant, positive B-2 glycoprotein IgA antibody), history of BLE DVT, HTN, remote history of cocaine abuse, right adrenal nodule (felt most consistent with adenoma by 2011 MRI), bilateral TKA.  Her hematologist is Dr. Truett Perna.  He has cleared her from his standpoint and provided perioperative anti-coagulation instructions including a Lovenox bridge while off Coumadin.  (See telephone encounter from Kem Boroughs, RN from 12/14/11.)  EKG on 12/26/11 showed NSR, septal infarct (age undetermined).  It was not felt significantly changed from her prior EKG on 08/07/10 (see Muse).  Echo on 11/20/08 showed: 1. Left ventricle: The cavity size was normal. Wall thickness was increased in a pattern of mild LVH. Systolic function was normal. The estimated ejection fraction was in the range of 60% to 65%. 2. Pulmonary arteries: PA peak pressure: 37mm Hg (S). 3. Trivial TR.  Nuclear stress test on 01/04/06 showed: 1. No fixed or reversible defects to suggest ischemia.  2. Normal left ventricular wall motion.  3. Ejection fraction estimated at 61%.  CXR pm 12/26/11 showed no active cardiopulmonary disease.  Labs noted.  She is for PT/PTT and a repeat T&S on the day of surgery.   If no significant change in her status then anticipate he can proceed as planned.  Shonna Chock, PA-C

## 2011-12-28 ENCOUNTER — Telehealth: Payer: Self-pay | Admitting: *Deleted

## 2011-12-28 ENCOUNTER — Other Ambulatory Visit: Payer: Self-pay

## 2011-12-28 NOTE — Telephone Encounter (Signed)
Patient asking if she needs IVC filter again for back surgery. Had one placed at order of Dr. August Saucer in 2012 with her total knee surgery. Per Dr. Truett Perna, as long as she is off anticoagulation less than 5 days, she does not need the filter. Also made Dr. Sueanne Margarita office aware. Confirmed with Ermie that she has picked up her Lovenox and will begin on 10/13.

## 2012-01-02 ENCOUNTER — Ambulatory Visit (HOSPITAL_BASED_OUTPATIENT_CLINIC_OR_DEPARTMENT_OTHER): Payer: Medicaid Other | Admitting: Pharmacist

## 2012-01-02 ENCOUNTER — Encounter: Payer: Self-pay | Admitting: Pharmacist

## 2012-01-02 ENCOUNTER — Other Ambulatory Visit (HOSPITAL_BASED_OUTPATIENT_CLINIC_OR_DEPARTMENT_OTHER): Payer: Medicare Other | Admitting: Lab

## 2012-01-02 ENCOUNTER — Ambulatory Visit: Payer: Self-pay | Admitting: Pharmacist

## 2012-01-02 DIAGNOSIS — I82403 Acute embolism and thrombosis of unspecified deep veins of lower extremity, bilateral: Secondary | ICD-10-CM

## 2012-01-02 DIAGNOSIS — I82409 Acute embolism and thrombosis of unspecified deep veins of unspecified lower extremity: Secondary | ICD-10-CM

## 2012-01-02 LAB — POCT INR: INR: 1.1

## 2012-01-02 LAB — PROTIME-INR
INR: 1.1 — ABNORMAL LOW (ref 2.00–3.50)
Protime: 13.2 Seconds (ref 10.6–13.4)

## 2012-01-02 MED ORDER — DEXTROSE 5 % IV SOLN
3.0000 g | INTRAVENOUS | Status: AC
  Administered 2012-01-03: 3 g via INTRAVENOUS
  Administered 2012-01-03: 1 g via INTRAVENOUS
  Filled 2012-01-02 (×2): qty 3000

## 2012-01-02 NOTE — Progress Notes (Signed)
Held Coumadin in preparation for spinal fusion on 10/16.  On Lovenox since 10/13 at 120mg  SQ BID with last dose tonight. Instructed to call Coumadin clinic on discharge to set up appmt for next visit.  Need to call on 01/08/12 to check status.

## 2012-01-02 NOTE — Patient Instructions (Signed)
Held Coumadin in preparation for spinal fusion on 10/16.  On Lovenox since 10/13 at 120mg SQ BID with last dose tonight. Instructed to call Coumadin clinic on discharge to set up appmt for next visit.  Need to call on 01/08/12 to check status. 

## 2012-01-02 NOTE — Progress Notes (Signed)
Please call patient on Monday 01/08/12 as a F/U upon DC from hospital to schedule next INR and CC appmt.  Home: [phone] Cell:[phone]

## 2012-01-03 ENCOUNTER — Inpatient Hospital Stay (HOSPITAL_COMMUNITY): Payer: Medicare Other

## 2012-01-03 ENCOUNTER — Encounter (HOSPITAL_COMMUNITY)
Admission: RE | Disposition: A | Discharge status: Home / Self Care | Payer: Self-pay | Source: Ambulatory Visit | Attending: Neurosurgery

## 2012-01-03 ENCOUNTER — Inpatient Hospital Stay (HOSPITAL_COMMUNITY)
Admission: RE | Admit: 2012-01-03 | Discharge: 2012-01-11 | DRG: 460 | Disposition: A | Discharge status: Home / Self Care | Payer: Medicare Other | Source: Ambulatory Visit | Attending: Neurosurgery | Admitting: Neurosurgery

## 2012-01-03 ENCOUNTER — Inpatient Hospital Stay (HOSPITAL_COMMUNITY): Payer: Medicare Other | Admitting: Vascular Surgery

## 2012-01-03 ENCOUNTER — Encounter (HOSPITAL_COMMUNITY): Payer: Self-pay | Admitting: Vascular Surgery

## 2012-01-03 ENCOUNTER — Encounter (HOSPITAL_COMMUNITY): Payer: Self-pay | Admitting: *Deleted

## 2012-01-03 DIAGNOSIS — Z86718 Personal history of other venous thrombosis and embolism: Secondary | ICD-10-CM

## 2012-01-03 DIAGNOSIS — K5900 Constipation, unspecified: Secondary | ICD-10-CM | POA: Diagnosis present

## 2012-01-03 DIAGNOSIS — J4489 Other specified chronic obstructive pulmonary disease: Secondary | ICD-10-CM | POA: Diagnosis present

## 2012-01-03 DIAGNOSIS — I10 Essential (primary) hypertension: Secondary | ICD-10-CM | POA: Diagnosis present

## 2012-01-03 DIAGNOSIS — M5137 Other intervertebral disc degeneration, lumbosacral region: Secondary | ICD-10-CM | POA: Diagnosis present

## 2012-01-03 DIAGNOSIS — IMO0001 Reserved for inherently not codable concepts without codable children: Secondary | ICD-10-CM | POA: Diagnosis present

## 2012-01-03 DIAGNOSIS — M545 Low back pain, unspecified: Secondary | ICD-10-CM

## 2012-01-03 DIAGNOSIS — IMO0002 Reserved for concepts with insufficient information to code with codable children: Secondary | ICD-10-CM

## 2012-01-03 DIAGNOSIS — Z6841 Body Mass Index (BMI) 40.0 and over, adult: Secondary | ICD-10-CM

## 2012-01-03 DIAGNOSIS — M51379 Other intervertebral disc degeneration, lumbosacral region without mention of lumbar back pain or lower extremity pain: Secondary | ICD-10-CM | POA: Diagnosis present

## 2012-01-03 DIAGNOSIS — J449 Chronic obstructive pulmonary disease, unspecified: Secondary | ICD-10-CM | POA: Diagnosis present

## 2012-01-03 DIAGNOSIS — M47817 Spondylosis without myelopathy or radiculopathy, lumbosacral region: Principal | ICD-10-CM | POA: Diagnosis present

## 2012-01-03 DIAGNOSIS — E119 Type 2 diabetes mellitus without complications: Secondary | ICD-10-CM | POA: Diagnosis present

## 2012-01-03 HISTORY — PX: ANTERIOR LUMBAR FUSION: SHX1170

## 2012-01-03 LAB — PROTIME-INR
INR: 0.99 (ref 0.00–1.49)
Prothrombin Time: 13 seconds (ref 11.6–15.2)

## 2012-01-03 LAB — TYPE AND SCREEN
ABO/RH(D): A POS
Antibody Screen: POSITIVE

## 2012-01-03 LAB — GLUCOSE, CAPILLARY
Glucose-Capillary: 121 mg/dL — ABNORMAL HIGH (ref 70–99)
Glucose-Capillary: 136 mg/dL — ABNORMAL HIGH (ref 70–99)
Glucose-Capillary: 145 mg/dL — ABNORMAL HIGH (ref 70–99)
Glucose-Capillary: 135 mg/dL — ABNORMAL HIGH (ref 70–99)

## 2012-01-03 LAB — APTT: aPTT: 30 seconds (ref 24–37)

## 2012-01-03 SURGERY — ANTERIOR LUMBAR FUSION 1 LEVEL
Anesthesia: General | Wound class: Clean

## 2012-01-03 MED ORDER — THROMBIN 20000 UNITS EX KIT
PACK | CUTANEOUS | Status: DC | PRN
  Administered 2012-01-03: 20000 [IU] via TOPICAL

## 2012-01-03 MED ORDER — MIDAZOLAM HCL 5 MG/5ML IJ SOLN
INTRAMUSCULAR | Status: DC | PRN
  Administered 2012-01-03: 2 mg via INTRAVENOUS

## 2012-01-03 MED ORDER — ROCURONIUM BROMIDE 100 MG/10ML IV SOLN
INTRAVENOUS | Status: DC | PRN
  Administered 2012-01-03: 50 mg via INTRAVENOUS

## 2012-01-03 MED ORDER — ONDANSETRON HCL 4 MG/2ML IJ SOLN
INTRAMUSCULAR | Status: DC | PRN
  Administered 2012-01-03: 4 mg via INTRAVENOUS

## 2012-01-03 MED ORDER — BUDESONIDE-FORMOTEROL FUMARATE 80-4.5 MCG/ACT IN AERO
2.0000 | INHALATION_SPRAY | Freq: Two times a day (BID) | RESPIRATORY_TRACT | Status: DC
  Administered 2012-01-03 – 2012-01-11 (×10): 2 via RESPIRATORY_TRACT
  Filled 2012-01-03 (×2): qty 6.9

## 2012-01-03 MED ORDER — OXYCODONE HCL 5 MG PO TABS: 5.0000 mg | ORAL_TABLET | Freq: Once | ORAL | Status: DC | PRN

## 2012-01-03 MED ORDER — AMLODIPINE BESYLATE 5 MG PO TABS
5.0000 mg | ORAL_TABLET | Freq: Every day | ORAL | Status: DC
  Administered 2012-01-04 – 2012-01-11 (×8): 5 mg via ORAL
  Filled 2012-01-03 (×9): qty 1

## 2012-01-03 MED ORDER — MENTHOL 3 MG MT LOZG: 1.0000 | LOZENGE | OROMUCOSAL | Status: DC | PRN

## 2012-01-03 MED ORDER — PANTOPRAZOLE SODIUM 40 MG PO TBEC
40.0000 mg | DELAYED_RELEASE_TABLET | Freq: Every day | ORAL | Status: DC
  Administered 2012-01-03 – 2012-01-11 (×9): 40 mg via ORAL
  Filled 2012-01-03 (×8): qty 1

## 2012-01-03 MED ORDER — MORPHINE SULFATE 2 MG/ML IJ SOLN
1.0000 mg | INTRAMUSCULAR | Status: DC | PRN
  Administered 2012-01-03: 4 mg via INTRAVENOUS
  Administered 2012-01-03: 2 mg via INTRAVENOUS
  Administered 2012-01-04: 4 mg via INTRAVENOUS
  Administered 2012-01-04: 2 mg via INTRAVENOUS
  Administered 2012-01-04 – 2012-01-05 (×2): 4 mg via INTRAVENOUS
  Administered 2012-01-05: 2 mg via INTRAVENOUS
  Administered 2012-01-06: 4 mg via INTRAVENOUS
  Administered 2012-01-06: 2 mg via INTRAVENOUS
  Administered 2012-01-07: 4 mg via INTRAVENOUS
  Filled 2012-01-03 (×2): qty 1
  Filled 2012-01-03 (×4): qty 2
  Filled 2012-01-03: qty 1
  Filled 2012-01-03: qty 2
  Filled 2012-01-03: qty 1
  Filled 2012-01-03: qty 2

## 2012-01-03 MED ORDER — MEPERIDINE HCL 25 MG/ML IJ SOLN: 6.2500 mg | INTRAMUSCULAR | Status: DC | PRN

## 2012-01-03 MED ORDER — LIDOCAINE HCL (CARDIAC) 20 MG/ML IV SOLN
INTRAVENOUS | Status: DC | PRN
  Administered 2012-01-03: 100 mg via INTRAVENOUS

## 2012-01-03 MED ORDER — POTASSIUM CHLORIDE IN NACL 20-0.9 MEQ/L-% IV SOLN
INTRAVENOUS | Status: DC
  Administered 2012-01-03: 17:00:00 via INTRAVENOUS
  Filled 2012-01-03 (×17): qty 1000

## 2012-01-03 MED ORDER — FENTANYL 25 MCG/HR TD PT72
50.0000 ug | MEDICATED_PATCH | TRANSDERMAL | Status: DC
  Administered 2012-01-03 – 2012-01-09 (×3): 50 ug via TRANSDERMAL
  Filled 2012-01-03: qty 1
  Filled 2012-01-03 (×2): qty 2

## 2012-01-03 MED ORDER — ONDANSETRON HCL 4 MG/2ML IJ SOLN
4.0000 mg | INTRAMUSCULAR | Status: DC | PRN
  Administered 2012-01-03 – 2012-01-04 (×2): 4 mg via INTRAVENOUS
  Filled 2012-01-03 (×2): qty 2

## 2012-01-03 MED ORDER — ACETAMINOPHEN 325 MG PO TABS: 650.0000 mg | ORAL_TABLET | ORAL | Status: DC | PRN

## 2012-01-03 MED ORDER — OXYCODONE HCL 5 MG/5ML PO SOLN: 5.0000 mg | Freq: Once | ORAL | Status: DC | PRN

## 2012-01-03 MED ORDER — GLYCOPYRROLATE 0.2 MG/ML IJ SOLN
INTRAMUSCULAR | Status: DC | PRN
  Administered 2012-01-03: .8 mg via INTRAVENOUS

## 2012-01-03 MED ORDER — HYDROCODONE-ACETAMINOPHEN 5-325 MG PO TABS
1.0000 | ORAL_TABLET | ORAL | Status: DC | PRN
  Administered 2012-01-08 – 2012-01-10 (×4): 2 via ORAL
  Filled 2012-01-03 (×5): qty 2

## 2012-01-03 MED ORDER — HEMOSTATIC AGENTS (NO CHARGE) OPTIME
TOPICAL | Status: DC | PRN
  Administered 2012-01-03: 1 via TOPICAL

## 2012-01-03 MED ORDER — PHENYLEPHRINE HCL 10 MG/ML IJ SOLN
INTRAMUSCULAR | Status: DC | PRN
  Administered 2012-01-03 (×3): 80 ug via INTRAVENOUS
  Administered 2012-01-03: 40 ug via INTRAVENOUS

## 2012-01-03 MED ORDER — CEFAZOLIN SODIUM-DEXTROSE 2-3 GM-% IV SOLR
INTRAVENOUS | Status: AC
  Filled 2012-01-03: qty 50

## 2012-01-03 MED ORDER — ACETAMINOPHEN 10 MG/ML IV SOLN
1000.0000 mg | Freq: Four times a day (QID) | INTRAVENOUS | Status: AC
  Administered 2012-01-03 – 2012-01-04 (×4): 1000 mg via INTRAVENOUS
  Filled 2012-01-03 (×4): qty 100

## 2012-01-03 MED ORDER — SODIUM CHLORIDE 0.9 % IJ SOLN
3.0000 mL | INTRAMUSCULAR | Status: DC | PRN
  Administered 2012-01-04: 3 mL via INTRAVENOUS

## 2012-01-03 MED ORDER — OXYCODONE-ACETAMINOPHEN 5-325 MG PO TABS
1.0000 | ORAL_TABLET | ORAL | Status: DC | PRN
  Administered 2012-01-03 – 2012-01-11 (×20): 2 via ORAL
  Filled 2012-01-03 (×23): qty 2

## 2012-01-03 MED ORDER — ACETAMINOPHEN 650 MG RE SUPP: 650.0000 mg | RECTAL | Status: DC | PRN

## 2012-01-03 MED ORDER — VECURONIUM BROMIDE 10 MG IV SOLR
INTRAVENOUS | Status: DC | PRN
  Administered 2012-01-03: 1 mg via INTRAVENOUS
  Administered 2012-01-03: 2 mg via INTRAVENOUS
  Administered 2012-01-03: 1 mg via INTRAVENOUS
  Administered 2012-01-03: 2 mg via INTRAVENOUS
  Administered 2012-01-03: 1 mg via INTRAVENOUS
  Administered 2012-01-03 (×4): 2 mg via INTRAVENOUS

## 2012-01-03 MED ORDER — SODIUM CHLORIDE 0.9 % IV SOLN
250.0000 mL | INTRAVENOUS | Status: DC
  Administered 2012-01-04: 80 mL via INTRAVENOUS

## 2012-01-03 MED ORDER — 0.9 % SODIUM CHLORIDE (POUR BTL) OPTIME
TOPICAL | Status: DC | PRN
  Administered 2012-01-03: 1000 mL

## 2012-01-03 MED ORDER — DIAZEPAM 5 MG PO TABS
5.0000 mg | ORAL_TABLET | Freq: Four times a day (QID) | ORAL | Status: DC | PRN
  Administered 2012-01-03 – 2012-01-10 (×7): 5 mg via ORAL
  Filled 2012-01-03 (×8): qty 1

## 2012-01-03 MED ORDER — LABETALOL HCL 5 MG/ML IV SOLN
INTRAVENOUS | Status: DC | PRN
  Administered 2012-01-03: 10 mg via INTRAVENOUS

## 2012-01-03 MED ORDER — PHENOL 1.4 % MT LIQD
1.0000 | OROMUCOSAL | Status: DC | PRN
  Filled 2012-01-03: qty 177

## 2012-01-03 MED ORDER — DILTIAZEM HCL ER COATED BEADS 180 MG PO CP24
180.0000 mg | ORAL_CAPSULE | Freq: Every day | ORAL | Status: DC
  Administered 2012-01-04 – 2012-01-11 (×8): 180 mg via ORAL
  Filled 2012-01-03 (×8): qty 1

## 2012-01-03 MED ORDER — LACTATED RINGERS IV SOLN
INTRAVENOUS | Status: DC | PRN
  Administered 2012-01-03 (×3): via INTRAVENOUS

## 2012-01-03 MED ORDER — ONDANSETRON HCL 4 MG/2ML IJ SOLN: 4.0000 mg | Freq: Once | INTRAMUSCULAR | Status: DC | PRN

## 2012-01-03 MED ORDER — NEOSTIGMINE METHYLSULFATE 1 MG/ML IJ SOLN
INTRAMUSCULAR | Status: DC | PRN
  Administered 2012-01-03: 4 mg via INTRAVENOUS

## 2012-01-03 MED ORDER — METFORMIN HCL 500 MG PO TABS
500.0000 mg | ORAL_TABLET | Freq: Every day | ORAL | Status: DC
  Administered 2012-01-04 – 2012-01-11 (×8): 500 mg via ORAL
  Filled 2012-01-03 (×9): qty 1

## 2012-01-03 MED ORDER — CLONIDINE HCL 0.1 MG PO TABS
0.1000 mg | ORAL_TABLET | Freq: Two times a day (BID) | ORAL | Status: DC
  Administered 2012-01-03 – 2012-01-11 (×16): 0.1 mg via ORAL
  Filled 2012-01-03 (×17): qty 1

## 2012-01-03 MED ORDER — CEFAZOLIN SODIUM 1-5 GM-% IV SOLN
INTRAVENOUS | Status: AC
  Filled 2012-01-03: qty 50

## 2012-01-03 MED ORDER — ALBUMIN HUMAN 5 % IV SOLN
INTRAVENOUS | Status: DC | PRN
  Administered 2012-01-03 (×2): via INTRAVENOUS

## 2012-01-03 MED ORDER — QUETIAPINE FUMARATE 50 MG PO TABS
50.0000 mg | ORAL_TABLET | Freq: Two times a day (BID) | ORAL | Status: DC
  Administered 2012-01-03 – 2012-01-11 (×16): 50 mg via ORAL
  Filled 2012-01-03 (×17): qty 1

## 2012-01-03 MED ORDER — HYDROMORPHONE HCL PF 1 MG/ML IJ SOLN
0.2500 mg | INTRAMUSCULAR | Status: DC | PRN
  Administered 2012-01-03 (×4): 0.5 mg via INTRAVENOUS

## 2012-01-03 MED ORDER — SODIUM CHLORIDE 0.9 % IJ SOLN
3.0000 mL | Freq: Two times a day (BID) | INTRAMUSCULAR | Status: DC
  Administered 2012-01-03 – 2012-01-09 (×10): 3 mL via INTRAVENOUS
  Administered 2012-01-11: 10:00:00 via INTRAVENOUS

## 2012-01-03 MED ORDER — WHITE PETROLATUM GEL
Status: AC
  Administered 2012-01-03: 0.4
  Filled 2012-01-03: qty 5

## 2012-01-03 MED ORDER — LACTATED RINGERS IV SOLN
INTRAVENOUS | Status: DC | PRN
  Administered 2012-01-03: 08:00:00 via INTRAVENOUS

## 2012-01-03 MED ORDER — DULOXETINE HCL 60 MG PO CPEP
60.0000 mg | ORAL_CAPSULE | Freq: Every day | ORAL | Status: DC
  Administered 2012-01-04 – 2012-01-11 (×8): 60 mg via ORAL
  Filled 2012-01-03 (×8): qty 1

## 2012-01-03 MED ORDER — GABAPENTIN 300 MG PO CAPS
300.0000 mg | ORAL_CAPSULE | Freq: Three times a day (TID) | ORAL | Status: DC
  Administered 2012-01-03 – 2012-01-11 (×24): 300 mg via ORAL
  Filled 2012-01-03 (×25): qty 1

## 2012-01-03 MED ORDER — LORATADINE 10 MG PO TABS
10.0000 mg | ORAL_TABLET | Freq: Every day | ORAL | Status: DC
  Administered 2012-01-03 – 2012-01-11 (×9): 10 mg via ORAL
  Filled 2012-01-03 (×9): qty 1

## 2012-01-03 MED ORDER — PROPOFOL 10 MG/ML IV BOLUS
INTRAVENOUS | Status: DC | PRN
  Administered 2012-01-03: 130 mg via INTRAVENOUS

## 2012-01-03 MED ORDER — SIMVASTATIN 20 MG PO TABS
20.0000 mg | ORAL_TABLET | Freq: Every day | ORAL | Status: DC
  Administered 2012-01-03: 20 mg via ORAL
  Filled 2012-01-03 (×2): qty 1

## 2012-01-03 MED ORDER — HYDROMORPHONE HCL PF 1 MG/ML IJ SOLN
INTRAMUSCULAR | Status: AC
  Filled 2012-01-03: qty 1

## 2012-01-03 MED ORDER — POLYETHYLENE GLYCOL 3350 17 G PO PACK
17.0000 g | PACK | Freq: Every day | ORAL | Status: DC
  Administered 2012-01-03 – 2012-01-11 (×9): 17 g via ORAL
  Filled 2012-01-03 (×9): qty 1

## 2012-01-03 MED ORDER — SUFENTANIL CITRATE 50 MCG/ML IV SOLN
INTRAVENOUS | Status: DC | PRN
  Administered 2012-01-03 (×3): 10 ug via INTRAVENOUS
  Administered 2012-01-03: 20 ug via INTRAVENOUS
  Administered 2012-01-03 (×4): 10 ug via INTRAVENOUS

## 2012-01-03 MED ORDER — ALBUTEROL SULFATE HFA 108 (90 BASE) MCG/ACT IN AERS
2.0000 | INHALATION_SPRAY | RESPIRATORY_TRACT | Status: DC | PRN
  Filled 2012-01-03: qty 6.7

## 2012-01-03 SURGICAL SUPPLY — 103 items
ADH SKN CLS APL DERMABOND .7 (GAUZE/BANDAGES/DRESSINGS) ×1
APPLIER CLIP 11 MED OPEN (CLIP) ×2
APR CLP MED 11 20 MLT OPN (CLIP) ×1
BUR MATCHSTICK NEURO 3.0 LAGG (BURR) ×1 IMPLANT
CANISTER SUCTION 2500CC (MISCELLANEOUS) ×2 IMPLANT
CLIP APPLIE 11 MED OPEN (CLIP) ×1 IMPLANT
CLIP TI MEDIUM 24 (CLIP) ×1 IMPLANT
CLIP TI WIDE RED SMALL 24 (CLIP) ×1 IMPLANT
CLOTH BEACON ORANGE TIMEOUT ST (SAFETY) ×3 IMPLANT
CONT SPEC 4OZ CLIKSEAL STRL BL (MISCELLANEOUS) ×2 IMPLANT
COVER BACK TABLE 24X17X13 BIG (DRAPES) IMPLANT
COVER TABLE BACK 60X90 (DRAPES) ×2 IMPLANT
Chronos TM granules-Medium 14-2 8mm/5cc (Neuro Prosthesis/Implant) ×1 IMPLANT
DECANTER SPIKE VIAL GLASS SM (MISCELLANEOUS) ×2 IMPLANT
DERMABOND ADVANCED (GAUZE/BANDAGES/DRESSINGS) ×1
DERMABOND ADVANCED .7 DNX12 (GAUZE/BANDAGES/DRESSINGS) ×1 IMPLANT
DRAPE C-ARM 42X72 X-RAY (DRAPES) ×7 IMPLANT
DRAPE INCISE IOBAN 66X45 STRL (DRAPES) ×1 IMPLANT
DRAPE LAPAROTOMY 100X72X124 (DRAPES) ×2 IMPLANT
DRAPE POUCH INSTRU U-SHP 10X18 (DRAPES) ×2 IMPLANT
ELECT BLADE 4.0 EZ CLEAN MEGAD (MISCELLANEOUS) ×2
ELECT BLADE 6.5 EXT (BLADE) ×1 IMPLANT
ELECT REM PT RETURN 9FT ADLT (ELECTROSURGICAL) ×2
ELECTRODE BLDE 4.0 EZ CLN MEGD (MISCELLANEOUS) ×1 IMPLANT
ELECTRODE REM PT RTRN 9FT ADLT (ELECTROSURGICAL) ×1 IMPLANT
GAUZE SPONGE 4X4 16PLY XRAY LF (GAUZE/BANDAGES/DRESSINGS) IMPLANT
GLOVE BIO SURGEON STRL SZ8 (GLOVE) ×1 IMPLANT
GLOVE BIO SURGEON STRL SZ8.5 (GLOVE) ×1 IMPLANT
GLOVE BIOGEL PI IND STRL 7.5 (GLOVE) ×1 IMPLANT
GLOVE BIOGEL PI IND STRL 8 (GLOVE) IMPLANT
GLOVE BIOGEL PI INDICATOR 7.5 (GLOVE) ×1
GLOVE BIOGEL PI INDICATOR 8 (GLOVE) ×1
GLOVE ECLIPSE 6.5 STRL STRAW (GLOVE) ×2 IMPLANT
GLOVE ECLIPSE 7.5 STRL STRAW (GLOVE) ×5 IMPLANT
GLOVE EXAM NITRILE LRG STRL (GLOVE) IMPLANT
GLOVE EXAM NITRILE MD LF STRL (GLOVE) IMPLANT
GLOVE EXAM NITRILE XL STR (GLOVE) IMPLANT
GLOVE EXAM NITRILE XS STR PU (GLOVE) IMPLANT
GLOVE INDICATOR 6.5 STRL GRN (GLOVE) IMPLANT
GLOVE INDICATOR 7.0 STRL GRN (GLOVE) IMPLANT
GLOVE INDICATOR 7.5 STRL GRN (GLOVE) IMPLANT
GLOVE INDICATOR 8.0 STRL GRN (GLOVE) IMPLANT
GLOVE INDICATOR 8.5 STRL (GLOVE) IMPLANT
GLOVE OPTIFIT SS 8.0 STRL (GLOVE) IMPLANT
GLOVE SS BIOGEL STRL SZ 8 (GLOVE) IMPLANT
GLOVE SUPERSENSE BIOGEL SZ 8 (GLOVE) ×1
GLOVE SURG SS PI 6.5 STRL IVOR (GLOVE) IMPLANT
GLOVE SURG SS PI 7.5 STRL IVOR (GLOVE) ×2 IMPLANT
GOWN BRE IMP SLV AUR LG STRL (GOWN DISPOSABLE) ×2 IMPLANT
GOWN BRE IMP SLV AUR XL STRL (GOWN DISPOSABLE) ×2 IMPLANT
GOWN PREVENTION PLUS XXLARGE (GOWN DISPOSABLE) ×1 IMPLANT
GOWN STRL NON-REIN LRG LVL3 (GOWN DISPOSABLE) ×1 IMPLANT
GOWN STRL REIN 2XL LVL4 (GOWN DISPOSABLE) ×2 IMPLANT
HEMOSTAT SNOW SURGICEL 2X4 (HEMOSTASIS) ×1 IMPLANT
HEMOSTAT SURGICEL 2X14 (HEMOSTASIS) IMPLANT
INSERT FOGARTY 61MM (MISCELLANEOUS) IMPLANT
INSERT FOGARTY SM (MISCELLANEOUS) IMPLANT
KIT BASIN OR (CUSTOM PROCEDURE TRAY) ×2 IMPLANT
KIT INFUSE X SMALL 1.4CC (Orthopedic Implant) ×1 IMPLANT
KIT ROOM TURNOVER OR (KITS) ×3 IMPLANT
LOOP VESSEL MAXI BLUE (MISCELLANEOUS) ×1 IMPLANT
LOOP VESSEL MINI RED (MISCELLANEOUS) ×1 IMPLANT
NDL SPNL 18GX3.5 QUINCKE PK (NEEDLE) IMPLANT
NEEDLE HYPO 22GX1.5 SAFETY (NEEDLE) ×1 IMPLANT
NEEDLE SPNL 18GX3.5 QUINCKE PK (NEEDLE) ×2 IMPLANT
NS IRRIG 1000ML POUR BTL (IV SOLUTION) ×2 IMPLANT
PACK LAMINECTOMY NEURO (CUSTOM PROCEDURE TRAY) ×2 IMPLANT
PAD ARMBOARD 7.5X6 YLW CONV (MISCELLANEOUS) ×4 IMPLANT
PEEK SYNFIX LR 26MM DEPTH 32MM (Peek) ×1 IMPLANT
SCREW 20MM (Screw) ×4 IMPLANT
SPONGE GAUZE 4X4 12PLY (GAUZE/BANDAGES/DRESSINGS) ×1 IMPLANT
SPONGE INTESTINAL PEANUT (DISPOSABLE) ×6 IMPLANT
SPONGE LAP 18X18 X RAY DECT (DISPOSABLE) ×2 IMPLANT
SPONGE LAP 4X18 X RAY DECT (DISPOSABLE) IMPLANT
SPONGE SURGIFOAM ABS GEL 100 (HEMOSTASIS) ×1 IMPLANT
STAPLER VISISTAT 35W (STAPLE) IMPLANT
SUT MNCRL AB 4-0 PS2 18 (SUTURE) ×1 IMPLANT
SUT PDS AB 1 CTX 36 (SUTURE) ×3 IMPLANT
SUT PROLENE 4 0 RB 1 (SUTURE)
SUT PROLENE 4-0 RB1 .5 CRCL 36 (SUTURE) ×4 IMPLANT
SUT PROLENE 5 0 CC1 (SUTURE) ×1 IMPLANT
SUT PROLENE 6 0 C 1 30 (SUTURE) ×1 IMPLANT
SUT PROLENE 6 0 CC (SUTURE) IMPLANT
SUT SILK 0 TIES 10X30 (SUTURE) ×1 IMPLANT
SUT SILK 2 0 TIES 10X30 (SUTURE) ×4 IMPLANT
SUT SILK 2 0SH CR/8 30 (SUTURE) IMPLANT
SUT SILK 3 0 TIES 10X30 (SUTURE) ×1 IMPLANT
SUT SILK 3 0SH CR/8 30 (SUTURE) IMPLANT
SUT VIC AB 0 CT1 18XCR BRD8 (SUTURE) ×1 IMPLANT
SUT VIC AB 0 CT1 27 (SUTURE)
SUT VIC AB 0 CT1 27XBRD ANBCTR (SUTURE) ×2 IMPLANT
SUT VIC AB 0 CT1 8-18 (SUTURE)
SUT VIC AB 2-0 CT1 18 (SUTURE) ×1 IMPLANT
SUT VIC AB 2-0 CT1 36 (SUTURE) ×2 IMPLANT
SUT VIC AB 3-0 SH 27 (SUTURE) ×2
SUT VIC AB 3-0 SH 27X BRD (SUTURE) ×1 IMPLANT
SUT VIC AB 3-0 SH 8-18 (SUTURE) ×1 IMPLANT
SUT VICRYL 4-0 PS2 18IN ABS (SUTURE) ×1 IMPLANT
SYR 20ML ECCENTRIC (SYRINGE) ×1 IMPLANT
TOWEL OR 17X24 6PK STRL BLUE (TOWEL DISPOSABLE) ×3 IMPLANT
TOWEL OR 17X26 10 PK STRL BLUE (TOWEL DISPOSABLE) ×3 IMPLANT
TRAY FOLEY CATH 14FRSI W/METER (CATHETERS) ×2 IMPLANT
WATER STERILE IRR 1000ML POUR (IV SOLUTION) ×2 IMPLANT

## 2012-01-03 NOTE — Transfer of Care (Signed)
Immediate Anesthesia Transfer of Care Note  Patient: [name]  Procedure(s) Performed: Procedure(s) (LRB) with comments: ANTERIOR LUMBAR FUSION 1 LEVEL (N/A) - Lumbar Four-Five Anterior Lumbar Interbody Fusion with Instrumentation ABDOMINAL EXPOSURE (N/A) - Abdominal Exposure  Patient Location: PACU  Anesthesia Type: General  Level of Consciousness: awake  Airway & Oxygen Therapy: Patient Spontanous Breathing and Patient connected to nasal cannula oxygen  Post-op Assessment: Report given to PACU RN, Post -op Vital signs reviewed and stable and Patient moving all extremities  Post vital signs: Reviewed and stable  Complications: No apparent anesthesia complications

## 2012-01-03 NOTE — Anesthesia Postprocedure Evaluation (Signed)
Anesthesia Post Note  Patient: [name]  Procedure(s) Performed: Procedure(s) (LRB): ANTERIOR LUMBAR FUSION 1 LEVEL (N/A) ABDOMINAL EXPOSURE (N/A)  Anesthesia type: general  Patient location: PACU  Post pain: Pain level controlled  Post assessment: Patient's Cardiovascular Status Stable  Last Vitals:  Filed Vitals:   01/03/12 1430  BP:   Pulse: 84  Temp:   Resp: 23    Post vital signs: Reviewed and stable  Level of consciousness: sedated  Complications: No apparent anesthesia complications

## 2012-01-03 NOTE — OR Nursing (Signed)
Patient had pulse ox on left great toe. The beginning sat was 96-98% and returned to baseline after retractor was removed.

## 2012-01-03 NOTE — Progress Notes (Signed)
Arterial line dicd site held for dressing pressure dressing appled fingers warm good 2+right radial pulse,denies numbness or pian in right hand or arm

## 2012-01-03 NOTE — Preoperative (Signed)
Beta Blockers   Reason not to administer Beta Blockers:Not Applicable 

## 2012-01-03 NOTE — H&P (Signed)
BP 139/89  Pulse 84  Temp 97.7 F (36.5 C) (Oral)  Resp 20  Ht 5\' 7"  (1.702 m)  Wt 123.378 kg (272 lb)  BMI 42.60 kg/m2  SpO2 96% [name] comes in today for evaluation of pain that she has had in her back. She said she has had pain in the right side of her low back for about 3-4 months.  She has never had pain like this before.  She says frequently the pain is more actually described as being in and around her flank.  It will occasionally radiate into the right lower extremity into the anterior thigh.  She finds herself rocking herself to comfort.  She does use a walker when she walks because of right knee problems.  She has had bilateral knee replacements.  She said that this pain came on in a gradual fashion but there was no antecedent trauma.  She had been taking Tramadol which did not help with this.  She had an MRI of the abdomen and pelvis which did not show an etiology and she subsequently was sent to me for further evaluation.   [name] is 5 and left-handed.    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    FAMILY HISTORY:    Mother is 44, father is 76.  Both are in fair health. Cancer is present in the family history.  The type is not described.  Hypertension also in the family history.    SOCIAL HISTORY:    She does not smoke.  She does not use alcohol.  She does have a history of substance abuse and says that last occurred in 2003.  She reports being 5'6" tall and 272 pounds.  REVIEW OF SYSTEMS:   Positive for eyeglasses, sore throat, hypertension, swelling in the feet, leg pain with walking, asthma, chronic cough, bronchitis, vomiting,  abdominal pain, anxiety, depression and diabetes.  She denies allergic, hematologic, neurological, skin, musculoskeletal, GU, and constitutional problems.    MEDICATIONS:    Cymbalta, Claritin, Diltiazem, Clonidine, Coumadin, Omeprazole, Albuterol, Metformin, Gabapentin, Simvastatin, Dexilant, Symbicort, Quetiapine, and Ergocalciferol.    ALLERGIES:    She is allergic to Lisinopril on looking at other medical records.     PHYSICAL EXAMINATION:  On examination, she is alert and oriented x four answering all questions appropriately.  She has a markedly antalgic gait. She uses a walker and walks in a stooped fashion.  1+ reflexes are the knees and ankles.  Downgoing toes to plantar stimulation.  Proprioception intact.  Normal muscle tone, bulk and coordination.  She is obese.  PERRL.  Full EOM's and full visual fields.  Hearing is intact to voice.  Uvula elevates in the midline.  Shoulder shrug is normal. Tongue protrudes in the midline.  Head is normocephalic and atraumatic.  Pulses are good at the wrists.  Sclerae are not injected.  Oral mucosa is normal.   No cervical masses or bruits.  Lungs are clear.  Heart: regular rhythm and rate.   SUMMARY: [name] returns today.  We did a myelogram with post myelogram CT.  What she has at L4-5 is significant facet arthropathy and joint disease.  She has air in both joints at this level, overgrown facets but little in the  way of any type of neural compromise.  I think [name] could well be served with a lumbar fusion.  I would do this via an anterior approach since she has so little problem in terms of canal space.    We will go ahead and get her set up for an anterior lumbar interbody arthrodesis.  This will I believe certainly help with the facet arthropathy and the microinstability that she does have at this level.  Risks and benefits, bleeding, infection, no relief, need for further surgery, fusion failure, hardware failure, no pain relief were discussed  among other risks.  She was given a detailed instruction sheet.  At this point, she agrees and would like to proceed.

## 2012-01-03 NOTE — Op Note (Signed)
01/03/2012  2:22 PM  PATIENT:  [name]  56 y.o. female  PRE-OPERATIVE DIAGNOSIS:  lumbar spondylosis lumbar radiculopathy L4/5  POST-OPERATIVE DIAGNOSIS:  lumbar spondylosis lumbar radiculopathy L4/5  PROCEDURE:  Procedure(s): ANTERIOR LUMBAR FUSION 1 LEVEL Synthes synfix 13mm ABDOMINAL EXPOSURE  SURGEON:  Surgeon(s): Colleen Hurt, MD Colleen Libman, MD Colleen Loron, MD  ASSISTANTS:jenkins  ANESTHESIA:   general  EBL:  Total I/O In: 2500 [I.V.:2000; IV Piggyback:500] Out: 1080 [Urine:580; Blood:500]  BLOOD ADMINISTERED:none  CELL SAVER GIVEN:none    COUNT:per nursing  DRAINS: none   SPECIMEN:  No Specimen  DICTATION: Under separate cover Dr. Juleen China will dictate the approach and closure. After opening we were able to confirm our location at L4/5. I opened the disc space with a 15 blade and started my discetomy. I used a variety of tools to remove the disc. I with fluoroscopy was able to see that I was deep enough. I used curettes, punches, rongeurs, to decompress the spinal canal. With the canal decompressed I sized the space. A 13 mm implant worked best. With Dr. Lovell Sheehan assistance we placed the implant filled with morselized allograft both infuse and Chronos. Final films showed the implant to be in good position. We placed 4 screws to secure the implant. 2 in L4 and 2 in L5.  After this Dr. Myra Gianotti returned to close the wound.   PLAN OF CARE: Admit to inpatient   PATIENT DISPOSITION:  PACU - hemodynamically stable.   Delay start of Pharmacological VTE agent (>24hrs) due to surgical blood loss or risk of bleeding:  yes

## 2012-01-03 NOTE — Anesthesia Preprocedure Evaluation (Addendum)
Anesthesia Evaluation  Patient identified by MRN, date of birth, ID band Patient awake    Reviewed: Allergy & Precautions, H&P , NPO status , Patient's Chart, lab work & pertinent test results  Airway Mallampati: II TM Distance: >3 FB Neck ROM: Full    Dental  (+) Teeth Intact   Pulmonary shortness of breath and with exertion, sleep apnea and Oxygen sleep apnea , COPD COPD inhaler,          Cardiovascular hypertension, Pt. on medications     Neuro/Psych    GI/Hepatic GERD-  Medicated and Controlled,  Endo/Other  diabetes, Well Controlled, Type 2, Oral Hypoglycemic Agents  Renal/GU      Musculoskeletal  (+) Fibromyalgia -  Abdominal   Peds  Hematology   Anesthesia Other Findings   Reproductive/Obstetrics                           Anesthesia Physical Anesthesia Plan  ASA: III  Anesthesia Plan: General   Post-op Pain Management:    Induction: Intravenous  Airway Management Planned: Oral ETT  Additional Equipment: Arterial line  Intra-op Plan:   Post-operative Plan: Extubation in OR  Informed Consent: I have reviewed the patients History and Physical, chart, labs and discussed the procedure including the risks, benefits and alternatives for the proposed anesthesia with the patient or authorized representative who has indicated his/her understanding and acceptance.   Dental advisory given  Plan Discussed with: CRNA and Surgeon  Anesthesia Plan Comments:       Anesthesia Quick Evaluation

## 2012-01-03 NOTE — Progress Notes (Signed)
Patient ID: [name], female   DOB: 1955-04-09, 57 y.o.   MRN: [id number] BP 133/83  Pulse 74  Temp 98.3 F (36.8 C) (Oral)  Resp 21  Ht 5\' 7"  (1.702 m)  Wt 123.378 kg (272 lb)  BMI 42.60 kg/m2  SpO2 100% Alert and oriented x 4 Moving lower extremities well Wound is clean and dry.  Doing well post op.

## 2012-01-04 LAB — GLUCOSE, CAPILLARY
Glucose-Capillary: 146 mg/dL — ABNORMAL HIGH (ref 70–99)
Glucose-Capillary: 171 mg/dL — ABNORMAL HIGH (ref 70–99)
Glucose-Capillary: 113 mg/dL — ABNORMAL HIGH (ref 70–99)

## 2012-01-04 MED ORDER — NYSTATIN 100000 UNIT/ML MT SUSP
5.0000 mL | Freq: Four times a day (QID) | OROMUCOSAL | Status: DC
  Administered 2012-01-04 – 2012-01-11 (×29): 500000 [IU] via ORAL
  Filled 2012-01-04 (×38): qty 5

## 2012-01-04 MED ORDER — ATORVASTATIN CALCIUM 10 MG PO TABS
10.0000 mg | ORAL_TABLET | Freq: Every day | ORAL | Status: DC
  Administered 2012-01-04 – 2012-01-10 (×7): 10 mg via ORAL
  Filled 2012-01-04 (×8): qty 1

## 2012-01-04 NOTE — Evaluation (Signed)
Physical Therapy Evaluation Patient Details Name: [name] MRN: [id number] DOB: Jun 17, 1955 Today's Date: 01/04/2012 Time: 7829-5621 PT Time Calculation (min): 13 min  PT Assessment / Plan / Recommendation Clinical Impression  Pt s/p ALF L4-5. Evaluation limited by pt secondary to fatigue due to pain meds. Pt will benefit from skilled PT in the acute care setting in order to maximize functional mobility and safety to return to PLOF for a d/c home    PT Assessment  Patient needs continued PT services    Follow Up Recommendations  Home health PT;Supervision for mobility/OOB    Does the patient have the potential to tolerate intense rehabilitation      Barriers to Discharge        Equipment Recommendations  None recommended by PT    Recommendations for Other Services     Frequency Min 5X/week    Precautions / Restrictions Precautions Precautions: Back Precaution Booklet Issued: Yes (comment) Precaution Comments: pt educated on 3/3 back precautions Restrictions Weight Bearing Restrictions: No   Pertinent Vitals/Pain Pt with 6/10 pain in stomach. RN aware, pain meds given earlier.       Mobility  Bed Mobility Bed Mobility: Rolling Left;Left Sidelying to Sit;Sitting - Scoot to [place] of Bed;Sit to Supine Rolling Left: 4: Min assist;With rail Left Sidelying to Sit: 4: Min assist;With rails Sitting - Scoot to Edge of Bed: 5: Supervision Sit to Supine: 3: Mod assist Details for Bed Mobility Assistance: VC for proper sequencing to maintain back precautions during transfer. Increased assistance back into bed with LEs and support of trunk. Increased cueing needed to maintain back precautions during scooting towards [facility] Transfers Transfers: Sit to Stand;Stand to Sit Sit to Stand: 4: Min assist;With upper extremity assist;From bed Stand to Sit: 4: Min assist;With upper extremity assist;To bed Details for Transfer Assistance: VC for proper hand placement and safety to/from  RW Ambulation/Gait Ambulation/Gait Assistance: 4: Min assist Ambulation Distance (Feet): 20 Feet Assistive device: Rolling walker Ambulation/Gait Assistance Details: VC for safe distance to RW as well as upright posture to avoid bending over while ambulating. Short steps, increased with distance. Distance limited by pt fatigue Gait Pattern: Step-to pattern;Decreased hip/knee flexion - right;Decreased hip/knee flexion - left;Trunk flexed;Antalgic Gait velocity: decreased gait speed Stairs: No    Shoulder Instructions     Exercises     PT Diagnosis: Acute pain;Difficulty walking  PT Problem List: Decreased activity tolerance;Decreased mobility;Decreased knowledge of use of DME;Decreased safety awareness;Decreased knowledge of precautions;Pain PT Treatment Interventions: DME instruction;Gait training;Functional mobility training;Therapeutic activities;Patient/family education   PT Goals Acute Rehab PT Goals PT Escareno Formulation: With patient Time For [place] Achievement: 01/11/12 Potential to Achieve Goals: Good Pt will go Supine/Side to Sit: with modified independence PT Voong: Supine/Side to Sit - Progress: Enterline set today Pt will go Sit to Supine/Side: with modified independence PT Tietje: Sit to Supine/Side - Progress: Mccaughey set today Pt will go Sit to Stand: with modified independence PT Petti: Sit to Stand - Progress: Kidane set today Pt will go Stand to Sit: with modified independence PT Drohan: Stand to Sit - Progress: Mancera set today Pt will Transfer Bed to Chair/Chair to Bed: with modified independence PT Transfer Juarbe: Bed to Chair/Chair to Bed - Progress: Kyllo set today Pt will Ambulate: >150 feet;with modified independence;with least restrictive assistive device PT Blount: Ambulate - Progress: Awan set today  Visit Information  Last PT Received On: 01/04/12 Assistance Needed: +1    Subjective Data  Patient Stated Cogle: to go  home   Prior Functioning  Home Living Lives With:  Alone Available Help at Discharge: Personal care attendant;Available PRN/intermittently Type of Home: Apartment Home Access: Elevator Home Layout: One level Bathroom Shower/Tub: Tub/shower unit;Curtain Firefighter: Standard Bathroom Accessibility: Yes How Accessible: Accessible via walker Home Adaptive Equipment: Straight cane;Walker - rolling;Shower chair with back Prior Function Level of Independence: Independent Able to Take Stairs?: Yes Driving: Yes Vocation: On disability Communication Communication: No difficulties Dominant Hand: Right    Cognition  Overall Cognitive Status: Appears within functional limits for tasks assessed/performed Arousal/Alertness: Awake/alert Orientation Level: Appears intact for tasks assessed Behavior During Session: [facility] for tasks performed    Extremity/Trunk Assessment Right Lower Extremity Assessment RLE ROM/Strength/Tone: Within functional levels RLE Sensation: WFL - Light Touch Left Lower Extremity Assessment LLE ROM/Strength/Tone: Within functional levels LLE Sensation: WFL - Light Touch   Balance    End of Session PT - End of Session Equipment Utilized During Treatment: Gait belt Activity Tolerance: Patient limited by fatigue Patient left: in bed;with call bell/phone within reach Nurse Communication: Mobility status   Milana Kidney 01/04/2012, 3:42 PM  01/04/2012 Milana Kidney DPT PAGER: [phone] OFFICE: [phone]

## 2012-01-04 NOTE — Progress Notes (Signed)
ENOXAPARIN 120 MG/0.8 ML SYR 48/30 APPROVED UNTIL 01/29/12.

## 2012-01-04 NOTE — Progress Notes (Signed)
Patient ID: [name], female   DOB: 12/29/1955, 56 y.o.   MRN: [id number] BP 108/66  Pulse 92  Temp 98.7 F (37.1 C) (Oral)  Resp 20  Ht 5\' 7"  (1.702 m)  Wt 123.378 kg (272 lb)  BMI 42.60 kg/m2  SpO2 94% Alert and oriented x 4 Complaining of back pain, more so belly pain Continue IV, still not eating well. Pt

## 2012-01-04 NOTE — Care Management Note (Unsigned)
    Page 1 of 2   01/05/2012     11:19:40 AM   CARE MANAGEMENT NOTE 01/05/2012  Patient:  [name], [name]   Account Number:  [id number]  Date Initiated:  01/04/2012  Documentation initiated by:  Letha Cape  Subjective/Objective Assessment:   dx s/p lumbar fusion  admit- lives alone, patient has two rolling walkers, a cane , a bsc and a shower chair at home.     Action/Plan:   pt eval   Anticipated DC Date:  01/06/2012   Anticipated DC Plan:  HOME W HOME HEALTH SERVICES      DC Planning Services  CM consult      [facility] Choice  HOME HEALTH   Choice offered to / List presented to:  C-1 Patient        HH arranged  HH-2 PT      [facility] agency  [facility]   Status of service:  In process, will continue to follow Medicare Important Message given?   (If response is "NO", the following Medicare IM given date fields will be blank) Date Medicare IM given:   Date Additional Medicare IM given:    Discharge Disposition:    Per UR Regulation:  Reviewed for med. necessity/level of care/duration of stay  If discussed at Long Length of Stay Meetings, dates discussed:    Comments:  01/05/12 11:10 Letha Cape RN,BSN [phone] physical therapy has rec hhpt, referral made to Elizebeth Koller notified  for hhpt.  Soc will begin 24-48 hrs post discharge.  01/04/12 14:21 Letha Cape RN, BSN [phone] patient lives alone, she has medication coverage with medicare/medicaid, she has transportation available at [employer]. Awaiting pt eval.  Patient states if [facility] services are recommended then she would like to work with [place]. Awaiting pt eval. NCM will continue to follow for dc needs.

## 2012-01-04 NOTE — Progress Notes (Signed)
VASCULAR PROGRESS NOTE  SUBJECTIVE: Complains of thrush on tongue  PHYSICAL EXAM: Filed Vitals:   01/03/12 1844 01/03/12 2137 01/04/12 0212 01/04/12 0536  BP: 132/79 133/75 117/67 116/80  Pulse: 72 80 83 83  Temp: 98.1 F (36.7 C) 98.3 F (36.8 C) 98.4 F (36.9 C) 98.4 F (36.9 C)  TempSrc: Oral Oral Oral Oral  Resp: 20 20 18 20   Height:      Weight:      SpO2: 100% 100% 100% 100%   Incision looks fine Left foot warm No significant leg swelling Abd: normal pitched BS  LABS:   Basename 01/04/12 0650 01/03/12 2247 01/03/12 1640  GLUCAP 146* 135* 145*   ASSESSMENT/PLAN: 1. 1 Day Post-Op s/p: Anterior RP exposure of L4-L5 2. Doing well 3. I ordered Nystatin for thrush 4. Vascular will be available if needed  Waverly Ferrari, MD, FACS Beeper: [phone] 01/04/2012

## 2012-01-04 NOTE — Progress Notes (Signed)
UR COMPLETED  

## 2012-01-05 DIAGNOSIS — Z6841 Body Mass Index (BMI) 40.0 and over, adult: Secondary | ICD-10-CM

## 2012-01-05 LAB — GLUCOSE, CAPILLARY
Glucose-Capillary: 168 mg/dL — ABNORMAL HIGH (ref 70–99)
Glucose-Capillary: 131 mg/dL — ABNORMAL HIGH (ref 70–99)

## 2012-01-05 MED ORDER — ENOXAPARIN SODIUM 120 MG/0.8ML ~~LOC~~ SOLN
120.0000 mg | Freq: Two times a day (BID) | SUBCUTANEOUS | Status: DC
  Administered 2012-01-05 – 2012-01-11 (×12): 120 mg via SUBCUTANEOUS
  Filled 2012-01-05 (×13): qty 0.8

## 2012-01-05 MED ORDER — WARFARIN SODIUM 10 MG PO TABS
10.0000 mg | ORAL_TABLET | ORAL | Status: DC
  Administered 2012-01-05 – 2012-01-10 (×4): 10 mg via ORAL
  Filled 2012-01-05 (×4): qty 1

## 2012-01-05 MED ORDER — WARFARIN SODIUM 7.5 MG PO TABS
15.0000 mg | ORAL_TABLET | ORAL | Status: DC
  Administered 2012-01-06 – 2012-01-09 (×2): 15 mg via ORAL
  Filled 2012-01-05 (×3): qty 2

## 2012-01-05 MED ORDER — WARFARIN SODIUM 7.5 MG PO TABS: 15.0000 mg | ORAL_TABLET | ORAL | Status: DC

## 2012-01-05 MED ORDER — WARFARIN - PHYSICIAN DOSING INPATIENT: Freq: Every day | Status: DC

## 2012-01-05 MED FILL — Sodium Chloride IV Soln 0.9%: INTRAVENOUS | Qty: 1000 | Status: AC

## 2012-01-05 MED FILL — Heparin Sodium (Porcine) Inj 1000 Unit/ML: INTRAMUSCULAR | Qty: 30 | Status: AC

## 2012-01-05 MED FILL — Sodium Chloride Irrigation Soln 0.9%: Qty: 3000 | Status: AC

## 2012-01-05 NOTE — Progress Notes (Signed)
Patient ID: [name], female   DOB: 1955/09/20, 56 y.o.   MRN: [id number] BP 119/78  Pulse 100  Temp 98.8 F (37.1 C) (Oral)  Resp 18  Ht 5\' 7"  (1.702 m)  Wt 123.378 kg (272 lb)  BMI 42.60 kg/m2  SpO2 96% Restarted her lovenox today. She has this at home so whenever she is discharged she knows what to do. Wound is clean, dry, and without signs of infection. Moving lower extremities well. Continue Pt, Ot Coumadin to restart Monday 2

## 2012-01-05 NOTE — Progress Notes (Signed)
PT Cancellation Note  Patient Details Name: [name] MRN: [id number] DOB: 03-16-1956   Cancelled Treatment:    Reason Eval/Treat Not Completed: Pain limiting ability to participate. Will attempt later as time allows.    Fredrich Birks 01/05/2012, 2:46 PM

## 2012-01-05 NOTE — Clinical Documentation Improvement (Signed)
BMI DOCUMENTATION CLARIFICATION QUERY  THIS DOCUMENT IS NOT A PERMANENT PART OF THE MEDICAL RECORD         01/05/12  Dear Dr. Franky Macho,  In an effort to better capture your patient's severity of illness, reflect appropriate length of stay and utilization of resources, a review of the patient medical record has revealed the following indicators.   Based on your clinical judgment, please clarify and document in a progress note and/or discharge summary the clinical condition associated with the following supporting information: In responding to this query please exercise your independent judgment.  The fact that a query is asked, does not imply that any particular answer is desired or expected.   Hello Dr. Franky Macho!  According to the documented Height and Weight in CHL/EPIC, the patients BMI is 42.7. If your clinical findings/judgment agrees with this,if possible could you please help clarify the suspected diagnosis in the progress note and discharge summary. THANK YOU!    BEST PRACTICE: A diagnosis of UNDERWEIGHT or MORBID OBESITY should have the BMI documented along with it.  Possible Clinical Conditions?  - Morbid Obesity  - Other condition (please document in the progress notes and/or discharge summary)  - Cannot Clinically determine at this time   Supporting Information:  Body mass index is 42.60 kg/(m^2). Filed Weights   01/03/12 0616  Weight: 272 lb (123.378 kg)   Height:5'7" on 10/16     Reviewed: additional documentation in the medical record   Thank You,  Saul Fordyce  Clinical Documentation Specialist: [phone] Pager  Health Information Management [place]

## 2012-01-06 LAB — PROTIME-INR
INR: 1.15 (ref 0.00–1.49)
Prothrombin Time: 14.5 seconds (ref 11.6–15.2)

## 2012-01-06 MED ORDER — FLEET ENEMA 7-19 GM/118ML RE ENEM
1.0000 | ENEMA | Freq: Every day | RECTAL | Status: DC | PRN
  Administered 2012-01-06: 1 via RECTAL
  Administered 2012-01-07 – 2012-01-10 (×2): via RECTAL
  Filled 2012-01-06 (×3): qty 1

## 2012-01-06 MED ORDER — PNEUMOCOCCAL VAC POLYVALENT 25 MCG/0.5ML IJ INJ
0.5000 mL | INJECTION | INTRAMUSCULAR | Status: AC
  Administered 2012-01-08: 0.5 mL via INTRAMUSCULAR
  Filled 2012-01-06: qty 0.5

## 2012-01-06 MED ORDER — INFLUENZA VIRUS VACC SPLIT PF IM SUSP
0.5000 mL | INTRAMUSCULAR | Status: AC
  Administered 2012-01-08: 0.5 mL via INTRAMUSCULAR
  Filled 2012-01-06: qty 0.5

## 2012-01-06 MED ORDER — BISACODYL 10 MG RE SUPP: 10.0000 mg | Freq: Every day | RECTAL | Status: DC | PRN

## 2012-01-06 NOTE — Progress Notes (Signed)
Physical Therapy Treatment Patient Details Name: [name] MRN: [id number] DOB: [birth year] Today's Date: 01/06/2012 Time: 4098-1191 PT Time Calculation (min): 14 min  PT Assessment / Plan / Recommendation Comments on Treatment Session  Pt progressing, although still extremely slow ambulation. Continue per plan enforcing safety prior to d/c home    Follow Up Recommendations  Home health PT;Supervision for mobility/OOB     Does the patient have the potential to tolerate intense rehabilitation     Barriers to Discharge        Equipment Recommendations  None recommended by PT    Recommendations for Other Services    Frequency Min 5X/week   Plan Discharge plan remains appropriate;Frequency remains appropriate    Precautions / Restrictions Precautions Precautions: Back Precaution Booklet Issued: Yes (comment) Precaution Comments: pt reeducated on 3/3 back precautions Restrictions Weight Bearing Restrictions: No   Pertinent Vitals/Pain Pain 4/10.     Mobility  Bed Mobility Bed Mobility: Rolling Left;Left Sidelying to Sit;Sitting - Scoot to [place] of Bed;Sit to Supine Rolling Left: 5: Supervision Left Sidelying to Sit: 5: Supervision Sitting - Scoot to Edge of Bed: 5: Supervision Sit to Supine: 5: Supervision Details for Bed Mobility Assistance: Pt able to complete without physical assist although still requires cues to maintain back precautions, especially getting back intobed Transfers Transfers: Sit to Stand;Stand to Sit Sit to Stand: 5: Supervision;With upper extremity assist;From bed Stand to Sit: 5: Supervision;With upper extremity assist;To chair/3-in-1 Details for Transfer Assistance: VC for proper hand placement and safety to/from RW Ambulation/Gait Ambulation/Gait Assistance: 4: Min guard Ambulation Distance (Feet): 150 Feet Assistive device: Rolling walker Ambulation/Gait Assistance Details: Minguard for safety as pt slightly unsteady on RW although no LOB or  increased assistance needed. Pt still with forward posture, cues for upright stance Gait Pattern: Step-to pattern;Decreased hip/knee flexion - right;Decreased hip/knee flexion - left;Trunk flexed;Antalgic Gait velocity: decreased gait speed Stairs: No    Exercises     PT Diagnosis:    PT Problem List:   PT Treatment Interventions:     PT Goals Acute Rehab PT Goals PT Shanafelt: Supine/Side to Sit - Progress: Progressing toward Sportsman PT Louthan: Sit to Supine/Side - Progress: Progressing toward Mihalko PT Blocher: Sit to Stand - Progress: Progressing toward Leathers PT Lees: Stand to Sit - Progress: Progressing toward Mcelrath PT Transfer Bonifas: Bed to Chair/Chair to Bed - Progress: Progressing toward Anagnos PT Hiser: Ambulate - Progress: Progressing toward Yett  Visit Information  Last PT Received On: 01/06/12    Subjective Data      Cognition  Overall Cognitive Status: Appears within functional limits for tasks assessed/performed Arousal/Alertness: Awake/alert Orientation Level: Appears intact for tasks assessed Behavior During Session: [facility] for tasks performed    Balance     End of Session PT - End of Session Equipment Utilized During Treatment: Gait belt Activity Tolerance: Patient tolerated treatment well Patient left: in bed;with call bell/phone within reach Nurse Communication: Mobility status   GP     Milana Kidney 01/06/2012, 4:39 PM

## 2012-01-06 NOTE — Progress Notes (Signed)
Patient ID: [name], female   DOB: 06/26/1955, 56 y.o.   MRN: [id number] Subjective: Patient reports she's doing okay. No leg pain. Still some pain around the incision.  Objective: Vital signs in last 24 hours: Temp:  [98.5 F (36.9 C)-98.9 F (37.2 C)] 98.5 F (36.9 C) (10/19 0535) Pulse Rate:  [85-100] 98  (10/19 0535) Resp:  [17-18] 17  (10/19 0535) BP: (112-137)/(67-78) 118/67 mmHg (10/19 0535) SpO2:  [93 %-100 %] 100 % (10/19 0535)  Intake/Output from previous day:   Intake/Output this shift:    Neurologic: Grossly normal Incision okay with some small hematoma noted Lab Results: Lab Results  Component Value Date   WBC 6.3 12/26/2011   HGB 11.3* 12/26/2011   HCT 35.1* 12/26/2011   MCV 82.2 12/26/2011   PLT 342 12/26/2011   Lab Results  Component Value Date   INR 1.15 01/06/2012   PROTIME 13.2 01/02/2012   BMET Lab Results  Component Value Date   NA 139 12/26/2011   K 3.4* 12/26/2011   CL 100 12/26/2011   CO2 27 12/26/2011   GLUCOSE 144* 12/26/2011   BUN 10 12/26/2011   CREATININE 0.56 12/26/2011   CALCIUM 8.9 12/26/2011    Studies/Results: No results found.  Assessment/Plan: Doing fairly well. Continue mobilization and pain control. Likely home tomorrow.   LOS: 3 days    Juluis Fitzsimmons S 01/06/2012, 9:50 AM

## 2012-01-07 LAB — TYPE AND SCREEN
ABO/RH(D): A POS
Antibody Screen: POSITIVE
DAT, IgG: NEGATIVE
Donor AG Type: NEGATIVE
Donor AG Type: NEGATIVE
Unit division: 0
Unit division: 0

## 2012-01-07 LAB — GLUCOSE, CAPILLARY: Glucose-Capillary: 112 mg/dL — ABNORMAL HIGH (ref 70–99)

## 2012-01-07 LAB — PROTIME-INR
INR: 1.32 (ref 0.00–1.49)
Prothrombin Time: 16.1 seconds — ABNORMAL HIGH (ref 11.6–15.2)

## 2012-01-07 NOTE — Op Note (Signed)
[facility]  Patient name: [name] MRN: [id number] DOB: 1955-05-26 Sex: female  01/03/2012 Pre-operative Diagnosis: Degenerative back disease  Post-operative diagnosis:  Same Surgeon:  Jorge Ny Co-surgeon:  Dr. Mikal Plane Procedure:   Anterior exposure, L4-L5 Anesthesia:  Gen. Blood Loss:  See anesthesia record Specimens:  None  Findings:  Adequate exposure  Indications:  The patient was scheduled for anterior exposure and repair of the L4-L5 disc space. I have been asked to provide anterior exposure. I met the patient in the preoperative holding area. I discussed my for the procedure. We discussed the potential complications which include but are not limited to injury to the iliac vein common iliac artery, and ureter. We also discussed wound complications. She was willing to proceed.  Procedure:  The patient was identified in the holding area and taken to [facility] NEURO OR 34  The patient was then placed supine on the table. general anesthesia was administered.  The patient was prepped and draped in the usual sterile fashion.  A time out was called and antibiotics were administered.  Fluoroscopy was used to identify the appropriate location of the skin incision based on the vertebral body location. A transverse incision was made below the umbilicus and the left lower quadrant. Cautery was used to divide the subcutaneous tissue down to the abdominal wall fascia. The fascia was then divided with cautery. The patient had scar tissue along the medial border of the rectus muscle do to previous surgery. Using a combination of cautery and sharp dissection the medial and lateral edges of the rectus muscle were defined. I entered the retroperitoneal space initially from the lateral side of the rectus muscle. A retroperitoneal plane was developed and the iliac artery was identified. I then reentered this area from the medial side of the rectus. A Balfour retractor was  placed. The iliac artery was skeletonized. The ureter was identified and reflected medially. I then reflected the iliac artery medially and expose the iliac vein. There were 3 iliolumbar branches which were each individually ligated between 2-0 silk ties. Using blunt dissection the tissue was reflected off of the spine. A Thompson retractor was placed. 200 reversed lip blades were placed on either side of the spine, and malleable retractors were placed superiorly and inferiorly. At this point fluoroscopy was used to identify the level we were at. This was initially determined to be L5-S1. I then used additional blunt retraction and dissection to get to the higher, appropriate disc space of L4-L5. Fluoroscopy was then used to confirm that we were at the appropriate disc space. Please see Dr. Jackelyn Knife note for full details of the remaining portion of the procedure. Also, of note, due to the patient's body habitus exposure was very difficult because of the depth of the wound. Once the neurosurgical portion of the procedure was completed, I came back into the room. The retractors were removed. I noted a defect in the iliac vein where one of the iliolumbar veins had been ligated. In order to repair this a single 5-0 Prolene stitch was used. With this the wound was felt to be hemostatic. There was a pulse within the iliac artery. The artery, vein, and ureter were placed back into their anatomic position. The retractors were removed. The fascia was then closed with 2 running #1 PDS suture. The subcutaneous tissue was closed in 2 layers of 2-0 Vicryl and the skin was closed with 4-0 Vicryl. Dermabond was placed on the wound. The  patient was successfully extubated and taken to the recovery room in stable condition.   Disposition:  To PACU in stable condition.   Juleen China, M.D. [facility] [place] Office: [phone] Pager:  [phone]

## 2012-01-07 NOTE — Progress Notes (Signed)
Patient complaining of severe pain related to her surgery 9/10.  Asking for morphine IV.  I explained that the [place] was to transition to po pain medications in anticipating of her discharge because she would not go home on IV pain medication.  Patient asked to receive morphine one more time to get her pain under control and then agreed to go strictly oral pain meds.  Gave 4mg  morphine IV and will continue to monitor.

## 2012-01-07 NOTE — Progress Notes (Signed)
Subjective: Patient reports She's doing okay she still doesn't have the pain in the well-controlled to comfortable going home  Objective: Vital signs in last 24 hours: Temp:  [98.2 F (36.8 C)-98.7 F (37.1 C)] 98.2 F (36.8 C) (10/20 0700) Pulse Rate:  [88-99] 89  (10/20 0700) Resp:  [16-22] 22  (10/20 0700) BP: (103-138)/(62-85) 113/65 mmHg (10/20 0700) SpO2:  [95 %-97 %] 96 % (10/20 0700)  Intake/Output from previous day: 10/19 0701 - 10/20 0700 In: 120 [P.O.:120] Out: -  Intake/Output this shift:    strength 5 out of 5 wound dry  Lab Results: No results found for this basename: WBC:2,HGB:2,HCT:2,PLT:2 in the last 72 hours BMET No results found for this basename: NA:2,K:2,CL:2,CO2:2,GLUCOSE:2,BUN:2,CREATININE:2,CALCIUM:2 in the last 72 hours  Studies/Results: No results found.  Assessment/Plan: Continue mobilization today hopefully discharge tomorrow  LOS: 4 days     [name] P 01/07/2012, 8:48 AM

## 2012-01-08 ENCOUNTER — Telehealth: Payer: Self-pay | Admitting: Pharmacist

## 2012-01-08 ENCOUNTER — Encounter (HOSPITAL_COMMUNITY): Payer: Self-pay | Admitting: Neurosurgery

## 2012-01-08 LAB — GLUCOSE, CAPILLARY: Glucose-Capillary: 100 mg/dL — ABNORMAL HIGH (ref 70–99)

## 2012-01-08 LAB — PROTIME-INR
INR: 1.77 — ABNORMAL HIGH (ref 0.00–1.49)
Prothrombin Time: 20 seconds — ABNORMAL HIGH (ref 11.6–15.2)

## 2012-01-08 NOTE — Progress Notes (Signed)
Physical Therapy Treatment Patient Details Name: Riann Oman Ek MRN: [id number] DOB: [birth year] Today's Date: 01/08/2012 Time: 4098-1191 PT Time Calculation (min): 19 min  PT Assessment / Plan / Recommendation Comments on Treatment Session  Patient requiring some encouragement to ambulate with PT this morning. Patient states that she has and aide that comes out for 4 hours a day and that her son with check in on her in the evening when he gets off of work. Patient needs recall of precautions.     Follow Up Recommendations  Home health PT;Supervision for mobility/OOB     Does the patient have the potential to tolerate intense rehabilitation     Barriers to Discharge        Equipment Recommendations  None recommended by PT    Recommendations for Other Services    Frequency Min 5X/week   Plan Discharge plan remains appropriate;Frequency remains appropriate    Precautions / Restrictions Precautions Precautions: Back Precaution Booklet Issued: Yes (comment) Precaution Comments: Patient unable to recall precautions. Reviewed and patient received new handout   Pertinent Vitals/Pain 7/10 back pain. RN aware    Mobility  Bed Mobility Rolling Left: 5: Supervision Left Sidelying to Sit: 5: Supervision Sitting - Scoot to Edge of Bed: 5: Supervision Transfers Sit to Stand: 5: Supervision;With upper extremity assist;From bed Stand to Sit: With upper extremity assist;5: Supervision;To chair/3-in-1 Details for Transfer Assistance: Cues for safety and positioning prior to sitting Ambulation/Gait Ambulation/Gait Assistance: 4: Min guard Ambulation Distance (Feet): 250 Feet Assistive device: Rolling walker Ambulation/Gait Assistance Details: Patient steady with ambulation and required several standing rest breaks.  Gait Pattern: Step-through pattern;Decreased stride length;Trunk flexed Gait velocity: decreased    Exercises     PT Diagnosis:    PT Problem List:   PT Treatment  Interventions:     PT Goals Acute Rehab PT Goals PT Manna: Supine/Side to Sit - Progress: Progressing toward Proto PT Squit[place]: Sit to Stand - Progress: Progressing toward Pfiffner PT Lefferts: Stand to Sit - Progress: Progressing toward Ingram PT Transfer Malkiewicz: Bed to Chair/Chair to Bed - Progress: Progressing toward Goth PT Bart[place]: Ambulate - Progress: Progressing toward Dehnert  Visit Information  Last PT Received On: 01/08/12 Assistance Needed: +1    Subjective Data      Cognition  Overall Cognitive Status: Appears within functional limits for tasks assessed/performed Arousal/Alertness: Awake/alert Orientation Level: Appears intact for tasks assessed Behavior During Session: [facility] for tasks performed    Balance     End of Session PT - End of Session Equipment Utilized During Treatment: Gait belt Activity Tolerance: Patient tolerated treatment well Patient left: in chair;with call bell/phone within reach Nurse Communication: Mobility status   GP     Fredrich Birks 01/08/2012, 8:58 AM  01/08/2012 Fredrich Birks PTA [phone] pager [phone] office

## 2012-01-08 NOTE — Progress Notes (Signed)
Patient ID: [name], female   DOB: 01-13-1956, 56 y.o.   MRN: [id number] Subjective:  The patient is alert and pleasant. She says she wants to go home on Wednesday.  Objective: Vital signs in last 24 hours: Temp:  [98.1 F (36.7 C)-98.8 F (37.1 C)] 98.7 F (37.1 C) (10/21 0933) Pulse Rate:  [74-87] 87  (10/21 0933) Resp:  [18-20] 20  (10/21 0933) BP: (107-128)/(60-72) 122/71 mmHg (10/21 0933) SpO2:  [96 %-98 %] 98 % (10/21 0933)  Intake/Output from previous day: 10/20 0701 - 10/21 0700 In: 400 [P.O.:400] Out: 1 [Emesis/NG output:1] Intake/Output this shift:    Physical exam the patient is alert and oriented. She is moving her lower extremities well.  Lab Results: No results found for this basename: WBC:2,HGB:2,HCT:2,PLT:2 in the last 72 hours BMET No results found for this basename: NA:2,K:2,CL:2,CO2:2,GLUCOSE:2,BUN:2,CREATININE:2,CALCIUM:2 in the last 72 hours  Studies/Results: No results found.  Assessment/Plan: Postop day #5: We will continue to mobilize her. Hopefully get her home and next few days.  LOS: 5 days     Nahjae Hoeg D 01/08/2012, 12:48 PM

## 2012-01-08 NOTE — Telephone Encounter (Signed)
Spoke with patient. She is currently in the hospital. She will call us when she is discharged to set up a follow up lab/coumadin clinic appointment. INR = 1.77 today.

## 2012-01-08 NOTE — Progress Notes (Signed)
   CARE MANAGEMENT NOTE 01/08/2012  Patient:  [name], [name]   Account Number:  [id number]  Date Initiated:  01/04/2012  Documentation initiated by:  Letha Cape  Subjective/Objective Assessment:   dx s/p lumbar fusion  admit- lives alone, patient has two rolling walkers, a cane , a bsc and a shower chair at home.     Action/Plan:   pt eval   Anticipated DC Date:  01/06/2012   Anticipated DC Plan:  HOME W HOME HEALTH SERVICES      DC Planning Services  CM consult      [facility] Choice  HOME HEALTH   Choice offered to / List presented to:  C-1 Patient        HH arranged  HH-2 PT      [facility] agency  [name]   Status of service:  In process, will continue to follow Medicare Important Message given?   (If response is "NO", the following Medicare IM given date fields will be blank) Date Medicare IM given:   Date Additional Medicare IM given:    Discharge Disposition:    Per UR Regulation:  Reviewed for med. necessity/level of care/duration of stay  If discussed at Long Length of Stay Meetings, dates discussed:    Comments:  10/21 /2013 1200 NCM spoke to pt and states she was with [place]. Notified Genevieve Norlander of pt scheduled d/c home on 10/22. Pt states she has RW, 3n1, shower chair, and RW with seat. Orders needed for [facility]. Contact info for [place] added to d/c instructions. Isidoro Donning RN CCM Case Mgmt phone [phone]  01/05/12 11:10 Letha Cape RN,BSN [phone] physical therapy has rec hhpt, referral made to Elizebeth Koller notified  for hhpt.  Soc will begin 24-48 hrs post discharge.  01/04/12 14:21 Letha Cape RN, BSN [phone] patient lives alone, she has medication coverage with medicare/medicaid, she has transportation available at [employer]. Awaiting pt eval.  Patient states if [facility] services are recommended then she would like to work with [place]. Awaiting pt eval. NCM will continue to follow for dc needs.

## 2012-01-09 LAB — GLUCOSE, CAPILLARY: Glucose-Capillary: 115 mg/dL — ABNORMAL HIGH (ref 70–99)

## 2012-01-09 LAB — PROTIME-INR
INR: 1.92 — ABNORMAL HIGH (ref 0.00–1.49)
Prothrombin Time: 21.2 seconds — ABNORMAL HIGH (ref 11.6–15.2)

## 2012-01-09 MED ORDER — FLEET ENEMA 7-19 GM/118ML RE ENEM: 1.0000 | ENEMA | Freq: Every day | RECTAL | Status: DC | PRN

## 2012-01-09 NOTE — Progress Notes (Signed)
Patient ID: [name], female   DOB: Aug 09, 1955, 56 y.o.   MRN: [id number] Subjective:  The patient is alert and pleasant. She complains of constipation. She wants to go home tomorrow.  Objective: Vital signs in last 24 hours: Temp:  [98.3 F (36.8 C)-98.7 F (37.1 C)] 98.7 F (37.1 C) (10/22 0512) Pulse Rate:  [80-88] 85  (10/22 0512) Resp:  [18-20] 20  (10/22 0512) BP: (111-142)/(62-75) 118/62 mmHg (10/22 0512) SpO2:  [97 %-98 %] 97 % (10/22 0512)  Intake/Output from previous day: 10/21 0701 - 10/22 0700 In: 120 [P.O.:120] Out: -  Intake/Output this shift:    Physical exam the patient is alert and oriented. She is moving her lower extremities well. Her abdomen is protuberant and soft.  Lab Results: No results found for this basename: WBC:2,HGB:2,HCT:2,PLT:2 in the last 72 hours BMET No results found for this basename: NA:2,K:2,CL:2,CO2:2,GLUCOSE:2,BUN:2,CREATININE:2,CALCIUM:2 in the last 72 hours  Studies/Results: No results found.  Assessment/Plan: Postop day #6: We will give her an enema today. We will tentatively plan to send her home tomorrow.  LOS: 6 days     Grazia Taffe D 01/09/2012, 7:40 AM

## 2012-01-09 NOTE — Progress Notes (Signed)
Physical Therapy Treatment Patient Details Name: [name] MRN: [id number] DOB: 1955/09/09 Today's Date: 01/09/2012 Time: 4696-2952 PT Time Calculation (min): 27 min  PT Assessment / Plan / Recommendation Comments on Treatment Session  Patient mobilizing well. Need to work on [employer] with gait. Patient with increased pain. RN aware. Planning on DC tomorrow if pain under control     Follow Up Recommendations  Home health PT;Supervision for mobility/OOB     Does the patient have the potential to tolerate intense rehabilitation     Barriers to Discharge        Equipment Recommendations  None recommended by PT    Recommendations for Other Services    Frequency Min 5X/week   Plan Discharge plan remains appropriate;Frequency remains appropriate    Precautions / Restrictions Precautions Precautions: Back Precaution Comments: Patient reeducated at beginning of session and able to recall at end of session.    Pertinent Vitals/Pain     Mobility  Bed Mobility Sit to Supine: 6: Modified independent (Device/Increase time) Transfers Sit to Stand: 6: Modified independent (Device/Increase time) Stand to Sit: 6: Modified independent (Device/Increase time) Ambulation/Gait Ambulation/Gait Assistance: 5: Supervision Ambulation Distance (Feet): 250 Feet Assistive device: Rolling walker Ambulation/Gait Assistance Details: Patient with decreased stride length and decreased cadence Gait Pattern: Step-through pattern;Decreased stance time - left Gait velocity: decreased    Exercises     PT Diagnosis:    PT Problem List:   PT Treatment Interventions:     PT Goals Acute Rehab PT Goals PT [place]: Supine/Side to Sit - Progress: Met PT Ildefonso: Sit to Stand - Progress: Met PT Kissinger: Stand to Sit - Progress: Met PT Transfer Anwar: Bed to Chair/Chair to Bed - Progress: Progressing toward Lal PT Cast: Ambulate - Progress: Progressing toward [place]  Visit Information  Last PT Received On:  01/09/12 Assistance Needed: +1    Subjective Data      Cognition  Overall Cognitive Status: Appears within functional limits for tasks assessed/performed Arousal/Alertness: Awake/alert Orientation Level: Appears intact for tasks assessed Behavior During Session: [facility] for tasks performed    Balance     End of Session PT - End of Session Equipment Utilized During Treatment: Gait belt Activity Tolerance: Patient tolerated treatment well Patient left: in chair;with call bell/phone within reach Nurse Communication: Mobility status   GP     Fredrich Birks 01/09/2012, 9:46 AM 01/09/2012 Fredrich Birks PTA [phone] pager [phone] office

## 2012-01-10 LAB — GLUCOSE, CAPILLARY
Glucose-Capillary: 109 mg/dL — ABNORMAL HIGH (ref 70–99)
Glucose-Capillary: 126 mg/dL — ABNORMAL HIGH (ref 70–99)

## 2012-01-10 LAB — PROTIME-INR
INR: 2.47 — ABNORMAL HIGH (ref 0.00–1.49)
Prothrombin Time: 25.6 seconds — ABNORMAL HIGH (ref 11.6–15.2)

## 2012-01-10 MED ORDER — FLEET ENEMA 7-19 GM/118ML RE ENEM: 1.0000 | ENEMA | Freq: Once | RECTAL | Status: DC

## 2012-01-10 NOTE — Progress Notes (Signed)
Patient ID: [name], female   DOB: 11-18-1955, 56 y.o.   MRN: [id number] Subjective:  The patient is alert and pleasant. She continues to complain of constipation. She does not feel she is ready to go home. I discussed skilled nursing facility/nursing home placement/rehabilitation with her. She has declined that option and wants to go home tomorrow.  Objective: Vital signs in last 24 hours: Temp:  [98 F (36.7 C)-98.9 F (37.2 C)] 98.2 F (36.8 C) (10/23 1105) Pulse Rate:  [78-87] 84  (10/23 1105) Resp:  [17-20] 20  (10/23 1105) BP: (118-144)/(66-77) 143/71 mmHg (10/23 1105) SpO2:  [95 %-99 %] 99 % (10/23 1105)  Intake/Output from previous day:   Intake/Output this shift:    Physical exam the patient is alert and oriented x3. She is moving her lower extremities well. Her abdomen is protuberant obese and soft.  Lab Results: No results found for this basename: WBC:2,HGB:2,HCT:2,PLT:2 in the last 72 hours BMET No results found for this basename: NA:2,K:2,CL:2,CO2:2,GLUCOSE:2,BUN:2,CREATININE:2,CALCIUM:2 in the last 72 hours  Studies/Results: No results found.  Assessment/Plan: Postop day #7: I encouraged the patient to mobilize. Will give her an enema today. And plan to send her home tomorrow.  LOS: 7 days     [name] 01/10/2012, 12:57 PM

## 2012-01-10 NOTE — Progress Notes (Signed)
Physical Therapy Treatment Patient Details Name: Hydeia Mcatee Madl MRN: [id number] DOB: 10/22/1955 Today's Date: 01/10/2012 Time: 4098-1191 PT Time Calculation (min): 24 min  PT Assessment / Plan / Recommendation Comments on Treatment Session  Progressing. Will sign off. Acute PT goals met. Encourage daily ambulation with staff    Follow Up Recommendations  Home health PT;Supervision for mobility/OOB     Does the patient have the potential to tolerate intense rehabilitation     Barriers to Discharge        Equipment Recommendations  None recommended by PT    Recommendations for Other Services    Frequency     Plan Discharge plan needs to be updated    Precautions / Restrictions Precautions Precautions: Back Precaution Comments: Patient able to recall all precautions but needs cueing to maintain with therapy   Pertinent Vitals/Pain     Mobility  Bed Mobility Rolling Left: 6: Modified independent (Device/Increase time) Left Sidelying to Sit: 6: Modified independent (Device/Increase time) Transfers Sit to Stand: 6: Modified independent (Device/Increase time) Stand to Sit: 6: Modified independent (Device/Increase time) Ambulation/Gait Ambulation/Gait Assistance: 6: Modified independent (Device/Increase time) Ambulation Distance (Feet): 300 Feet Assistive device: Rolling walker Ambulation/Gait Assistance Details: decreased cadence but no change with cueing and no LOB Gait Pattern: Step-through pattern;Decreased stride length Gait velocity: very decreased cadence    Exercises     PT Diagnosis:    PT Problem List:   PT Treatment Interventions:     PT Goals Acute Rehab PT Goals PT Kosh: Supine/Side to Sit - Progress: Met PT Delauter: Sit to Supine/Side - Progress: Met PT Keiffer: Sit to Stand - Progress: Met PT Madl: Stand to Sit - Progress: Met PT Transfer Mand[place]: Bed to Chair/Chair to Bed - Progress: Met PT Stoltz[place]us: Ambulate - Progress: Met  Visit Information  Last PT Received  On: 01/10/12 Assistance Needed: +1    Subjective Data      Cognition  Overall Cognitive Status: Appears within functional limits for tasks assessed/performed Arousal/Alertness: Awake/alert Orientation Level: Appears intact for tasks assessed Behavior During Session: [facility] for tasks performed    Balance     End of Session PT - End of Session Equipment Utilized During Treatment: Gait belt Activity Tolerance: Patient tolerated treatment well Patient left: in chair;with call bell/phone within reach Nurse Communication: Mobility status   GP     Fredrich Birks 01/10/2012, 10:56 AM 01/10/2012 Fredrich Birks PTA [phone] pager [phone] office

## 2012-01-10 NOTE — Progress Notes (Signed)
I have read and agree with the below note.  Keyundra Fant Helen Whitlow PT, DPT Pager: [phone] 

## 2012-01-11 LAB — GLUCOSE, CAPILLARY: Glucose-Capillary: 111 mg/dL — ABNORMAL HIGH (ref 70–99)

## 2012-01-11 LAB — PROTIME-INR
INR: 2.82 — ABNORMAL HIGH (ref 0.00–1.49)
Prothrombin Time: 28.2 seconds — ABNORMAL HIGH (ref 11.6–15.2)

## 2012-01-11 MED ORDER — OXYCODONE-ACETAMINOPHEN 5-325 MG PO TABS: 1.0000 | ORAL_TABLET | ORAL | Status: DC | PRN

## 2012-01-11 MED ORDER — DIAZEPAM 5 MG PO TABS: 5.0000 mg | ORAL_TABLET | Freq: Four times a day (QID) | ORAL | Status: DC | PRN

## 2012-01-11 NOTE — Discharge Summary (Signed)
Physician Discharge Summary  Patient ID: [name] MRN: [id number] DOB/AGE: 1956-01-05 56 y.o.  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 Mikal Plane admitted the patient to [facility] on 01/03/2012. On that day he performed 8 L4-5 anterior lumbar interbody fusion and instrumentation. The surgery were well.  The patient's postoperative course was remarkable for slow mobilization and constipation. These resolved and by 10/24 13 patient was afebrile, her vital signs were stable, she had a bowel movement, and she was requesting discharge to home. The patient was given oral and written discharge instructions. All her questions were answered.  Consults: None Significant Diagnostic Studies: None Treatments: L4-5 interlumbar interbody fusion, placement of interbody prosthesis is instrumentation. Discharge Exam: Blood pressure 120/75, pulse 75, temperature 98.5 F (36.9 C), temperature source Oral, resp. rate 18, height 5\' 7"  (1.702 m), weight 123.378 kg (272 lb), SpO2 97.00%. The patient is alert and oriented. She is moving her lower extremities well. Her abdomen is protuberant obese and soft. Her incision is healing well.  Disposition: Home  Discharge Orders    Future Appointments: Provider: Department: Dept Phone: Center:   09/05/2012 8:30 AM Delcie Roch [facility] [phone] None   09/05/2012 9:00 AM Ladene Artist, MD [facility] [phone] None     Future Orders Please Complete By Expires   Diet - low sodium heart healthy      Increase activity slowly      Discharge instructions      Comments:   Call [phone] for a followup appointment.   No dressing needed      Call MD for:  temperature >100.4      Call MD for:  persistant  nausea and vomiting      Call MD for:  severe uncontrolled pain      Call MD for:  redness, tenderness, or signs of infection (pain, swelling, redness, odor or green/yellow discharge around incision site)      Call MD for:  difficulty breathing, headache or visual disturbances      Call MD for:  hives      Call MD for:  persistant dizziness or light-headedness      Call MD for:  extreme fatigue          Medication List     As of 01/11/2012  1:37 PM    STOP taking these medications         enoxaparin 120 MG/0.8ML injection   Commonly known as: LOVENOX      meloxicam 15 MG tablet   Commonly known as: MOBIC      TAKE these medications         albuterol 108 (90 BASE) MCG/ACT inhaler   Commonly known as: PROVENTIL HFA;VENTOLIN HFA   Inhale 2 puffs into the lungs every 4 (four) hours as needed. For shortness of breath      amLODipine 5 MG tablet   Commonly known as: NORVASC   Take 5 mg by mouth daily.      budesonide-formoterol 80-4.5 MCG/ACT inhaler   Commonly known as: SYMBICORT   Inhale 2 puffs into the lungs 2 (two) times daily.      cloNIDine 0.1 MG tablet   Commonly known as: CATAPRES   Take 0.1 mg by mouth 2 (two) times daily.  diazepam 5 MG tablet   Commonly known as: VALIUM   Take 1 tablet (5 mg total) by mouth every 6 (six) hours as needed.      diltiazem 180 MG 24 hr capsule   Commonly known as: CARDIZEM CD   Take 180 mg by mouth daily.      DULoxetine 60 MG capsule   Commonly known as: CYMBALTA   Take 60 mg by mouth daily.      fentaNYL 50 MCG/HR   Commonly known as: DURAGESIC - dosed mcg/hr   Apply 1 patch topically every 3 (three) days.      gabapentin 300 MG capsule   Commonly known as: NEURONTIN   Take 1 capsule by mouth Three times a day.      loratadine 10 MG tablet   Commonly known as: CLARITIN   Take 10 mg by mouth daily.      metFORMIN 500 MG tablet   Commonly known as: GLUCOPHAGE   Take 500 mg by mouth daily with breakfast. Checks  CBG every MWF      omeprazole 20 MG capsule   Commonly known as: PRILOSEC   Take 20 mg by mouth 2 (two) times daily.      oxyCODONE-acetaminophen 5-325 MG per tablet   Commonly known as: PERCOCET/ROXICET   Take 1 tablet by mouth every 8 (eight) hours as needed. For pain      oxyCODONE-acetaminophen 5-325 MG per tablet   Commonly known as: PERCOCET/ROXICET   Take 1-2 tablets by mouth every 4 (four) hours as needed.      polyethylene glycol packet   Commonly known as: MIRALAX / GLYCOLAX   Take 17 g by mouth daily.      QUEtiapine 50 MG tablet   Commonly known as: SEROQUEL   Take 50 mg by mouth 2 (two) times daily.      simvastatin 20 MG tablet   Commonly known as: ZOCOR   Take 20 mg by mouth at bedtime.      warfarin 5 MG tablet   Commonly known as: COUMADIN   Take 10 mg by mouth daily. Take 10mg  on Monday, Wednesday, Friday, and Sunday.      warfarin 5 MG tablet   Commonly known as: COUMADIN   Take 15 mg by mouth 3 (three) times a week. Tuesdays, Thursdays and Saturdays           Follow-up Information    Follow up with [facility]. (Home Health Physical Therapy)    Contact information:   [phone]         Signed: Cristi Loron 01/11/2012, 1:37 PM

## 2012-01-15 ENCOUNTER — Ambulatory Visit: Payer: Medicare Other

## 2012-01-15 ENCOUNTER — Telehealth: Payer: Self-pay | Admitting: *Deleted

## 2012-01-15 ENCOUNTER — Other Ambulatory Visit: Payer: Medicare Other | Admitting: Lab

## 2012-01-15 NOTE — Telephone Encounter (Signed)
Left message with apology that she missed her appointment today. Was not feeling well. Wishes to be called back to reschedule. Forwarded message to [facility].

## 2012-01-16 ENCOUNTER — Encounter (HOSPITAL_COMMUNITY): Payer: Self-pay | Admitting: Emergency Medicine

## 2012-01-16 ENCOUNTER — Emergency Department (HOSPITAL_COMMUNITY)
Admission: EM | Admit: 2012-01-16 | Discharge: 2012-01-17 | Disposition: A | Discharge status: Home / Self Care | Payer: Medicare Other | Attending: Emergency Medicine | Admitting: Emergency Medicine

## 2012-01-16 DIAGNOSIS — Z79899 Other long term (current) drug therapy: Secondary | ICD-10-CM | POA: Insufficient documentation

## 2012-01-16 DIAGNOSIS — Z86718 Personal history of other venous thrombosis and embolism: Secondary | ICD-10-CM | POA: Insufficient documentation

## 2012-01-16 DIAGNOSIS — J449 Chronic obstructive pulmonary disease, unspecified: Secondary | ICD-10-CM | POA: Insufficient documentation

## 2012-01-16 DIAGNOSIS — Z8739 Personal history of other diseases of the musculoskeletal system and connective tissue: Secondary | ICD-10-CM | POA: Insufficient documentation

## 2012-01-16 DIAGNOSIS — E119 Type 2 diabetes mellitus without complications: Secondary | ICD-10-CM | POA: Insufficient documentation

## 2012-01-16 DIAGNOSIS — G4730 Sleep apnea, unspecified: Secondary | ICD-10-CM | POA: Insufficient documentation

## 2012-01-16 DIAGNOSIS — J4489 Other specified chronic obstructive pulmonary disease: Secondary | ICD-10-CM | POA: Insufficient documentation

## 2012-01-16 DIAGNOSIS — I10 Essential (primary) hypertension: Secondary | ICD-10-CM | POA: Insufficient documentation

## 2012-01-16 DIAGNOSIS — Z5189 Encounter for other specified aftercare: Secondary | ICD-10-CM | POA: Insufficient documentation

## 2012-01-16 DIAGNOSIS — Z8639 Personal history of other endocrine, nutritional and metabolic disease: Secondary | ICD-10-CM | POA: Insufficient documentation

## 2012-01-16 DIAGNOSIS — Z862 Personal history of diseases of the blood and blood-forming organs and certain disorders involving the immune mechanism: Secondary | ICD-10-CM | POA: Insufficient documentation

## 2012-01-16 DIAGNOSIS — M549 Dorsalgia, unspecified: Secondary | ICD-10-CM | POA: Insufficient documentation

## 2012-01-16 DIAGNOSIS — Z7901 Long term (current) use of anticoagulants: Secondary | ICD-10-CM | POA: Insufficient documentation

## 2012-01-16 DIAGNOSIS — K219 Gastro-esophageal reflux disease without esophagitis: Secondary | ICD-10-CM | POA: Insufficient documentation

## 2012-01-16 LAB — CBC WITH DIFFERENTIAL/PLATELET
Basophils Absolute: 0.1 10*3/uL (ref 0.0–0.1)
Basophils Relative: 1 % (ref 0–1)
Eosinophils Absolute: 0.1 10*3/uL (ref 0.0–0.7)
Eosinophils Relative: 2 % (ref 0–5)
HCT: 29.1 % — ABNORMAL LOW (ref 36.0–46.0)
Hemoglobin: 9.3 g/dL — ABNORMAL LOW (ref 12.0–15.0)
Lymphocytes Relative: 27 % (ref 12–46)
Lymphs Abs: 1.8 10*3/uL (ref 0.7–4.0)
MCH: 26 pg (ref 26.0–34.0)
MCHC: 32 g/dL (ref 30.0–36.0)
MCV: 81.3 fL (ref 78.0–100.0)
Monocytes Absolute: 0.5 10*3/uL (ref 0.1–1.0)
Monocytes Relative: 8 % (ref 3–12)
Neutro Abs: 4.1 10*3/uL (ref 1.7–7.7)
Neutrophils Relative %: 62 % (ref 43–77)
Platelets: 680 10*3/uL — ABNORMAL HIGH (ref 150–400)
RBC: 3.58 MIL/uL — ABNORMAL LOW (ref 3.87–5.11)
RDW: 18.2 % — ABNORMAL HIGH (ref 11.5–15.5)
WBC: 6.6 10*3/uL (ref 4.0–10.5)

## 2012-01-16 LAB — BASIC METABOLIC PANEL
BUN: 8 mg/dL (ref 6–23)
CO2: 27 mEq/L (ref 19–32)
Calcium: 9.3 mg/dL (ref 8.4–10.5)
Chloride: 99 mEq/L (ref 96–112)
Creatinine, Ser: 0.66 mg/dL (ref 0.50–1.10)
GFR calc Af Amer: >90 mL/min (ref >90)
GFR calc non Af Amer: >90 mL/min (ref >90)
Glucose, Bld: 112 mg/dL — ABNORMAL HIGH (ref 70–99)
Potassium: 4.5 mEq/L (ref 3.5–5.1)
Sodium: 136 mEq/L (ref 135–145)

## 2012-01-16 MED ORDER — HYDROMORPHONE HCL PF 1 MG/ML IJ SOLN
1.0000 mg | Freq: Once | INTRAMUSCULAR | Status: AC
  Administered 2012-01-16: 1 mg via INTRAVENOUS
  Filled 2012-01-16: qty 1

## 2012-01-16 MED ORDER — CYCLOBENZAPRINE HCL 10 MG PO TABS: 10.0000 mg | ORAL_TABLET | Freq: Two times a day (BID) | ORAL | Status: DC | PRN

## 2012-01-16 MED ORDER — METHOCARBAMOL 100 MG/ML IJ SOLN: 1000.0000 mg | Freq: Once | INTRAMUSCULAR | Status: DC

## 2012-01-16 MED ORDER — OXYCODONE-ACETAMINOPHEN 10-325 MG PO TABS: 1.0000 | ORAL_TABLET | Freq: Four times a day (QID) | ORAL | Status: DC | PRN

## 2012-01-16 MED ORDER — METHOCARBAMOL 100 MG/ML IJ SOLN
1000.0000 mg | INTRAVENOUS | Status: AC
  Administered 2012-01-16: 1000 mg via INTRAVENOUS
  Filled 2012-01-16: qty 10

## 2012-01-16 MED ORDER — HYDROMORPHONE HCL PF 2 MG/ML IJ SOLN
2.0000 mg | Freq: Once | INTRAMUSCULAR | Status: AC
  Administered 2012-01-16: 2 mg via INTRAVENOUS
  Filled 2012-01-16: qty 1

## 2012-01-16 NOTE — ED Notes (Signed)
Per EMS - pt recently had lumbar sx, reports pain meds weren't working well so she called her physician, he refused to write a stronger prescription so she called EMS to take her to ED to get some pain medication. 136/70, HR 82, RR 20 O2 97% room air. Pt rates pain at 8/10.

## 2012-01-16 NOTE — ED Provider Notes (Signed)
History     CSN: [id number]  Arrival date & time 01/16/12  2053   First MD Initiated Contact with Patient 01/16/12 2109      Chief Complaint  Patient presents with  . Back Pain    (Consider location/radiation/quality/duration/timing/severity/associated sxs/prior treatment) HPI Comments: Patient with history of lumbago presents with back pain. Of note patient had anterior lumbar fusion on 01/03/12. She states that her pain is 8/10 with radiation to both legs. She states that her physician gave her percocet but it has not helped with the pain. She called his office for a stronger medication but did not get a chance to pick them up. Denies fever or chills. Denies NVD or abdominal pain. Denies numbness or tingling. Denies dysuria, urgency, or frequency. Denies bowel or bladder incontinence.   The history is provided by the patient. No language interpreter was used.    Past Medical History  Diagnosis Date  . DVT (deep venous thrombosis)     Recurrent  . Adrenal mass 03/226    Benign  . Lupus Anticoagulant Positive   . Clotting disorder     +beta-2-glycoprotein IgA antibody  . History of cocaine abuse     Remote history   . Heart murmur   . Hypertension     takes meds daily  . Depression   . Shortness of breath     exertion or lying flat  . 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 Sleep apnea     2l of oxygen at night  . On home oxygen therapy     at night  . Diabetes mellitus     120s usually fasting  . GERD (gastroesophageal reflux disease)   . Dizziness, nonspecific   . Arthritis     knees/multiple orthopedic conditons; lower back  . History of blood transfusion   . Fibromyalgia     Past Surgical History  Procedure Date  . Carpal tunnel release     Bilateral  . Abdominal hysterectomy 1989    Fibroids  . Back surgery     cervical spine---disk disease  . Cesarean section   . Replacement total knee 11/17/2008   bilateral  . Right elbow surgery   . Tubal ligation   . Gallstones removed   . Anterior lumbar fusion 01/03/2012    Procedure: ANTERIOR LUMBAR FUSION 1 LEVEL;  Surgeon: Carmela Hurt, MD;  Location: [facility];  Service: Neurosurgery;  Laterality: N/A;  Lumbar Four-Five Anterior Lumbar Interbody Fusion with Instrumentation    Family History  Problem Relation Age of Onset  . Hypertension Father   . Heart failure Father   . Hypertension Mother   . Diabetes Mother   . Cancer Mother     Pancreatic    History  Substance Use Topics  . Smoking status: Never Smoker   . Smokeless tobacco: Never Used  . Alcohol Use: Not on file    OB History    Grav Para Term Preterm Abortions TAB SAB Ect Mult Living                  Review of Systems  Constitutional: Negative for fever and chills.  Cardiovascular: Negative for leg swelling.  Gastrointestinal: Negative for nausea and vomiting.       No incontinence of bowel  Genitourinary: Negative for difficulty urinating.       No incontinence or retention  Musculoskeletal: Positive for back pain.  Skin: Negative for  rash.  Neurological: Negative for weakness and numbness.    Allergies  Corticosteroids  Home Medications   Current Outpatient Rx  Name Route Sig Dispense Refill  . ALBUTEROL SULFATE HFA 108 (90 BASE) MCG/ACT IN AERS Inhalation Inhale 2 puffs into the lungs every 4 (four) hours as needed. For shortness of breath    . AMLODIPINE BESYLATE 5 MG PO TABS Oral Take 5 mg by mouth daily.     . BUDESONIDE-FORMOTEROL FUMARATE 80-4.5 MCG/ACT IN AERO Inhalation Inhale 2 puffs into the lungs 2 (two) times daily.     Marland Kitchen CLONIDINE HCL 0.1 MG PO TABS Oral Take 0.1 mg by mouth 2 (two) times daily.     Marland Kitchen DIAZEPAM 5 MG PO TABS Oral Take 5 mg by mouth every 6 (six) hours as needed. For muscle spasms    . DILTIAZEM HCL ER COATED BEADS 180 MG PO CP24 Oral Take 180 mg by mouth daily.     . DULOXETINE HCL 60 MG PO CPEP Oral Take 60 mg by mouth  daily.     . FENTANYL 50 MCG/HR TD PT72 Topical Apply 1 patch topically every 3 (three) days.    Marland Kitchen GABAPENTIN 300 MG PO CAPS Oral Take 300 mg by mouth Three times a day.     Marland Kitchen LORATADINE 10 MG PO TABS Oral Take 10 mg by mouth daily.     Marland Kitchen METFORMIN HCL 500 MG PO TABS Oral Take 500 mg by mouth daily with breakfast. Checks CBG every MWF    . NYSTATIN 100000 UNIT/ML MT SUSP Oral Take 100,000 Units by mouth Three times a day. Until all gone    . OMEPRAZOLE 20 MG PO CPDR Oral Take 20 mg by mouth 2 (two) times daily.    . OXYCODONE-ACETAMINOPHEN 5-325 MG PO TABS Oral Take 1-3 tablets by mouth every 4 (four) hours as needed. Dosage per pt    . POLYETHYLENE GLYCOL 3350 PO PACK Oral Take 17 g by mouth daily.     . QUETIAPINE FUMARATE 50 MG PO TABS Oral Take 50 mg by mouth 2 (two) times daily.     Marland Kitchen SIMVASTATIN 20 MG PO TABS Oral Take 20 mg by mouth at bedtime.     . WARFARIN SODIUM 5 MG PO TABS Oral Take 10-15 mg by mouth daily. 2 tabs (10 mg total) MWFSun; All other days 3 tabs (15 mg total)      BP 133/72  Pulse 86  Temp 98.8 F (37.1 C) (Oral)  Resp 16  SpO2 94%  Physical Exam  Nursing note and vitals reviewed. Constitutional: She appears well-developed and well-nourished. She appears distressed.  HENT:  Head: Normocephalic and atraumatic.  Mouth/Throat: Oropharynx is clear and moist.  Eyes: Conjunctivae normal are normal. No scleral icterus.  Neck: Normal range of motion. Neck supple.  Cardiovascular: Normal rate, regular rhythm and normal heart sounds.   Pulmonary/Chest: Effort normal and breath sounds normal.  Abdominal: Soft. Bowel sounds are normal. There is no tenderness.    Musculoskeletal: She exhibits tenderness. She exhibits no edema.       Lumbar back: She exhibits decreased range of motion and tenderness.       Back:  Neurological: She is alert. She has normal reflexes. She displays normal reflexes. She exhibits normal muscle tone. Coordination normal.  Skin: Skin is  warm.    ED Course  Procedures (including critical care time)  Labs Reviewed  CBC WITH DIFFERENTIAL - Abnormal; Notable for the following:  RBC 3.58 (*)     Hemoglobin 9.3 (*)     HCT 29.1 (*)     RDW 18.2 (*)     Platelets 680 (*)     All other components within normal limits  BASIC METABOLIC PANEL - Abnormal; Notable for the following:    Glucose, Bld 112 (*)     All other components within normal limits   Results for orders placed during the hospital encounter of 01/16/12  CBC WITH DIFFERENTIAL      Component Value Range   WBC 6.6  4.0 - 10.5 K/uL   RBC 3.58 (*) 3.87 - 5.11 MIL/uL   Hemoglobin 9.3 (*) 12.0 - 15.0 g/dL   HCT 96.0 (*) 45.4 - 09.8 %   MCV 81.3  78.0 - 100.0 fL   MCH 26.0  26.0 - 34.0 pg   MCHC 32.0  30.0 - 36.0 g/dL   RDW 11.9 (*) 14.7 - 82.9 %   Platelets 680 (*) 150 - 400 K/uL   Neutrophils Relative 62  43 - 77 %   Lymphocytes Relative 27  12 - 46 %   Monocytes Relative 8  3 - 12 %   Eosinophils Relative 2  0 - 5 %   Basophils Relative 1  0 - 1 %   Neutro Abs 4.1  1.7 - 7.7 K/uL   Lymphs Abs 1.8  0.7 - 4.0 K/uL   Monocytes Absolute 0.5  0.1 - 1.0 K/uL   Eosinophils Absolute 0.1  0.0 - 0.7 K/uL   Basophils Absolute 0.1  0.0 - 0.1 K/uL   RBC Morphology POLYCHROMASIA PRESENT    BASIC METABOLIC PANEL      Component Value Range   Sodium 136  135 - 145 mEq/L   Potassium 4.5  3.5 - 5.1 mEq/L   Chloride 99  96 - 112 mEq/L   CO2 27  19 - 32 mEq/L   Glucose, Bld 112 (*) 70 - 99 mg/dL   BUN 8  6 - 23 mg/dL   Creatinine, Ser 5.62  0.50 - 1.10 mg/dL   Calcium 9.3  8.4 - 13.0 mg/dL   GFR calc non Af Amer >90  >90 mL/min   GFR calc Af Amer >90  >90 mL/min    No results found.   1. Back pain       MDM  Patient presented with chronic back pain. Patient recently had back surgery on 01/03/12/ Area of incision well healed and non fluctuant. CBC and BMP: unremarkable. Patient given pain medication and muscle relaxer with improvement. Discharged with  short course of pain medication and instructions to follow-up with her surgeon. No red flags for cauda equina as the patient has good strength and is not incontinent. No red flags for epidural abscess as she is afebrile and incision show not signs of infection. Return precautions given.        Pixie Casino, PA-C 01/16/12 2359

## 2012-01-17 ENCOUNTER — Other Ambulatory Visit: Payer: Self-pay | Admitting: Neurosurgery

## 2012-01-17 DIAGNOSIS — M549 Dorsalgia, unspecified: Secondary | ICD-10-CM

## 2012-01-18 NOTE — ED Provider Notes (Signed)
Medical screening examination/treatment/procedure(s) were performed by non-physician practitioner and as supervising physician I was immediately available for consultation/collaboration.  Astoria Condon, MD 01/18/12 0011 

## 2012-01-23 ENCOUNTER — Ambulatory Visit
Admission: RE | Admit: 2012-01-23 | Discharge: 2012-01-23 | Disposition: A | Discharge status: Home / Self Care | Payer: Medicare Other | Source: Ambulatory Visit | Attending: Neurosurgery | Admitting: Neurosurgery

## 2012-01-23 DIAGNOSIS — M549 Dorsalgia, unspecified: Secondary | ICD-10-CM

## 2012-01-23 MED ORDER — IOHEXOL 300 MG/ML  SOLN
125.0000 mL | Freq: Once | INTRAMUSCULAR | Status: AC | PRN
  Administered 2012-01-23: 125 mL via INTRAVENOUS

## 2012-01-24 ENCOUNTER — Inpatient Hospital Stay (HOSPITAL_COMMUNITY)
Admission: AD | Admit: 2012-01-24 | Discharge: 2012-01-31 | DRG: 948 | Disposition: A | Discharge status: Home / Self Care | Payer: Medicare Other | Source: Ambulatory Visit | Attending: Neurosurgery | Admitting: Neurosurgery

## 2012-01-24 ENCOUNTER — Encounter (HOSPITAL_COMMUNITY): Payer: Self-pay | Admitting: *Deleted

## 2012-01-24 DIAGNOSIS — Z7901 Long term (current) use of anticoagulants: Secondary | ICD-10-CM

## 2012-01-24 DIAGNOSIS — Z981 Arthrodesis status: Secondary | ICD-10-CM

## 2012-01-24 DIAGNOSIS — Z86718 Personal history of other venous thrombosis and embolism: Secondary | ICD-10-CM

## 2012-01-24 DIAGNOSIS — F329 Major depressive disorder, single episode, unspecified: Secondary | ICD-10-CM | POA: Diagnosis present

## 2012-01-24 DIAGNOSIS — Z833 Family history of diabetes mellitus: Secondary | ICD-10-CM

## 2012-01-24 DIAGNOSIS — I10 Essential (primary) hypertension: Secondary | ICD-10-CM | POA: Diagnosis present

## 2012-01-24 DIAGNOSIS — K219 Gastro-esophageal reflux disease without esophagitis: Secondary | ICD-10-CM | POA: Diagnosis present

## 2012-01-24 DIAGNOSIS — J449 Chronic obstructive pulmonary disease, unspecified: Secondary | ICD-10-CM | POA: Diagnosis present

## 2012-01-24 DIAGNOSIS — G4730 Sleep apnea, unspecified: Secondary | ICD-10-CM | POA: Diagnosis present

## 2012-01-24 DIAGNOSIS — Z8249 Family history of ischemic heart disease and other diseases of the circulatory system: Secondary | ICD-10-CM

## 2012-01-24 DIAGNOSIS — R109 Unspecified abdominal pain: Secondary | ICD-10-CM | POA: Diagnosis present

## 2012-01-24 DIAGNOSIS — F1410 Cocaine abuse, uncomplicated: Secondary | ICD-10-CM | POA: Diagnosis present

## 2012-01-24 DIAGNOSIS — J4489 Other specified chronic obstructive pulmonary disease: Secondary | ICD-10-CM | POA: Diagnosis present

## 2012-01-24 DIAGNOSIS — IMO0001 Reserved for inherently not codable concepts without codable children: Secondary | ICD-10-CM | POA: Diagnosis present

## 2012-01-24 DIAGNOSIS — G8918 Other acute postprocedural pain: Principal | ICD-10-CM | POA: Diagnosis present

## 2012-01-24 DIAGNOSIS — E119 Type 2 diabetes mellitus without complications: Secondary | ICD-10-CM | POA: Diagnosis present

## 2012-01-24 DIAGNOSIS — Z79899 Other long term (current) drug therapy: Secondary | ICD-10-CM

## 2012-01-24 DIAGNOSIS — Z6841 Body Mass Index (BMI) 40.0 and over, adult: Secondary | ICD-10-CM

## 2012-01-24 DIAGNOSIS — F3289 Other specified depressive episodes: Secondary | ICD-10-CM | POA: Diagnosis present

## 2012-01-24 LAB — GLUCOSE, CAPILLARY: Glucose-Capillary: 117 mg/dL — ABNORMAL HIGH (ref 70–99)

## 2012-01-24 LAB — PROTIME-INR
INR: 1.94 — ABNORMAL HIGH (ref 0.00–1.49)
Prothrombin Time: 21.4 seconds — ABNORMAL HIGH (ref 11.6–15.2)

## 2012-01-24 MED ORDER — LORATADINE 10 MG PO TABS
10.0000 mg | ORAL_TABLET | Freq: Every day | ORAL | Status: DC
  Administered 2012-01-25 – 2012-01-31 (×7): 10 mg via ORAL
  Filled 2012-01-24 (×7): qty 1

## 2012-01-24 MED ORDER — ONDANSETRON HCL 4 MG/2ML IJ SOLN
4.0000 mg | Freq: Four times a day (QID) | INTRAMUSCULAR | Status: DC | PRN
  Administered 2012-01-25: 4 mg via INTRAVENOUS
  Filled 2012-01-24: qty 2

## 2012-01-24 MED ORDER — METFORMIN HCL 500 MG PO TABS
500.0000 mg | ORAL_TABLET | Freq: Every day | ORAL | Status: DC
  Administered 2012-01-25 – 2012-01-26 (×2): 500 mg via ORAL
  Filled 2012-01-24 (×3): qty 1

## 2012-01-24 MED ORDER — DILTIAZEM HCL ER COATED BEADS 180 MG PO CP24
180.0000 mg | ORAL_CAPSULE | Freq: Every day | ORAL | Status: DC
  Administered 2012-01-25 – 2012-01-31 (×7): 180 mg via ORAL
  Filled 2012-01-24 (×7): qty 1

## 2012-01-24 MED ORDER — POLYETHYLENE GLYCOL 3350 17 G PO PACK
17.0000 g | PACK | Freq: Every day | ORAL | Status: DC
  Administered 2012-01-25 – 2012-01-31 (×7): 17 g via ORAL
  Filled 2012-01-24 (×8): qty 1

## 2012-01-24 MED ORDER — OXYCODONE-ACETAMINOPHEN 5-325 MG PO TABS
1.0000 | ORAL_TABLET | Freq: Four times a day (QID) | ORAL | Status: DC | PRN
  Administered 2012-01-24 – 2012-01-31 (×10): 1 via ORAL
  Filled 2012-01-24 (×10): qty 1

## 2012-01-24 MED ORDER — DIPHENHYDRAMINE HCL 50 MG/ML IJ SOLN: 12.5000 mg | Freq: Four times a day (QID) | INTRAMUSCULAR | Status: DC | PRN

## 2012-01-24 MED ORDER — AMLODIPINE BESYLATE 5 MG PO TABS
5.0000 mg | ORAL_TABLET | Freq: Every day | ORAL | Status: DC
  Administered 2012-01-25 – 2012-01-31 (×7): 5 mg via ORAL
  Filled 2012-01-24 (×7): qty 1

## 2012-01-24 MED ORDER — HYDROMORPHONE 0.3 MG/ML IV SOLN
INTRAVENOUS | Status: DC
  Administered 2012-01-24: 22:00:00 via INTRAVENOUS
  Administered 2012-01-25: 2.4 mg via INTRAVENOUS
  Administered 2012-01-25: 1.2 mg via INTRAVENOUS
  Administered 2012-01-25: 18:00:00 via INTRAVENOUS
  Administered 2012-01-26: 0.9 mg via INTRAVENOUS
  Administered 2012-01-26 (×2): 0.3 mg via INTRAVENOUS
  Administered 2012-01-26: 0.9 mg via INTRAVENOUS
  Administered 2012-01-27: 0.6 mg via INTRAVENOUS
  Administered 2012-01-27 (×3): 0.9 mg via INTRAVENOUS
  Administered 2012-01-27 – 2012-01-28 (×2): 0.6 mg via INTRAVENOUS
  Administered 2012-01-28: 03:00:00 via INTRAVENOUS
  Administered 2012-01-28: 0.9 mg via INTRAVENOUS
  Administered 2012-01-28: 1.2 mg via INTRAVENOUS
  Administered 2012-01-28 (×2): 0.6 mg via INTRAVENOUS
  Administered 2012-01-29: 1.2 mg via INTRAVENOUS
  Administered 2012-01-29: 0.3 mg via INTRAVENOUS
  Administered 2012-01-29: 0.9 mg via INTRAVENOUS
  Administered 2012-01-29 (×2): 0.6 mg via INTRAVENOUS
  Administered 2012-01-29: 0.9 mg via INTRAVENOUS
  Administered 2012-01-29: 19:00:00 via INTRAVENOUS
  Administered 2012-01-30: 0.6 mg via INTRAVENOUS
  Filled 2012-01-24 (×5): qty 25

## 2012-01-24 MED ORDER — ALBUTEROL SULFATE HFA 108 (90 BASE) MCG/ACT IN AERS
2.0000 | INHALATION_SPRAY | RESPIRATORY_TRACT | Status: DC | PRN
  Administered 2012-01-24 – 2012-01-30 (×7): 2 via RESPIRATORY_TRACT
  Filled 2012-01-24 (×2): qty 6.7

## 2012-01-24 MED ORDER — GABAPENTIN 300 MG PO CAPS
300.0000 mg | ORAL_CAPSULE | Freq: Three times a day (TID) | ORAL | Status: DC
  Administered 2012-01-24 – 2012-01-31 (×21): 300 mg via ORAL
  Filled 2012-01-24 (×23): qty 1

## 2012-01-24 MED ORDER — OXYCODONE HCL 5 MG PO TABS
5.0000 mg | ORAL_TABLET | Freq: Four times a day (QID) | ORAL | Status: DC | PRN
  Administered 2012-01-24 – 2012-01-31 (×10): 5 mg via ORAL
  Filled 2012-01-24 (×10): qty 1

## 2012-01-24 MED ORDER — NALOXONE HCL 0.4 MG/ML IJ SOLN: 0.4000 mg | INTRAMUSCULAR | Status: DC | PRN

## 2012-01-24 MED ORDER — QUETIAPINE FUMARATE 50 MG PO TABS
50.0000 mg | ORAL_TABLET | Freq: Two times a day (BID) | ORAL | Status: DC
  Administered 2012-01-24 – 2012-01-31 (×14): 50 mg via ORAL
  Filled 2012-01-24 (×15): qty 1

## 2012-01-24 MED ORDER — OXYCODONE-ACETAMINOPHEN 10-325 MG PO TABS: 1.0000 | ORAL_TABLET | Freq: Four times a day (QID) | ORAL | Status: DC | PRN

## 2012-01-24 MED ORDER — DULOXETINE HCL 60 MG PO CPEP
60.0000 mg | ORAL_CAPSULE | Freq: Every day | ORAL | Status: DC
  Administered 2012-01-25 – 2012-01-31 (×7): 60 mg via ORAL
  Filled 2012-01-24 (×7): qty 1

## 2012-01-24 MED ORDER — DIAZEPAM 5 MG PO TABS
5.0000 mg | ORAL_TABLET | Freq: Four times a day (QID) | ORAL | Status: DC | PRN
  Administered 2012-01-26 – 2012-01-30 (×8): 5 mg via ORAL
  Filled 2012-01-24 (×8): qty 1

## 2012-01-24 MED ORDER — CLONIDINE HCL 0.1 MG PO TABS
0.1000 mg | ORAL_TABLET | Freq: Two times a day (BID) | ORAL | Status: DC
  Administered 2012-01-24 – 2012-01-31 (×14): 0.1 mg via ORAL
  Filled 2012-01-24 (×15): qty 1

## 2012-01-24 MED ORDER — SODIUM CHLORIDE 0.9 % IJ SOLN: 9.0000 mL | INTRAMUSCULAR | Status: DC | PRN

## 2012-01-24 MED ORDER — PANTOPRAZOLE SODIUM 40 MG PO TBEC
40.0000 mg | DELAYED_RELEASE_TABLET | Freq: Every day | ORAL | Status: DC
  Administered 2012-01-25 – 2012-01-31 (×7): 40 mg via ORAL
  Filled 2012-01-24 (×7): qty 1

## 2012-01-24 MED ORDER — BUDESONIDE-FORMOTEROL FUMARATE 80-4.5 MCG/ACT IN AERO
2.0000 | INHALATION_SPRAY | Freq: Two times a day (BID) | RESPIRATORY_TRACT | Status: DC
  Administered 2012-01-24 – 2012-01-31 (×13): 2 via RESPIRATORY_TRACT
  Filled 2012-01-24 (×2): qty 6.9

## 2012-01-24 MED ORDER — SODIUM CHLORIDE 0.9 % IJ SOLN
3.0000 mL | Freq: Two times a day (BID) | INTRAMUSCULAR | Status: DC
  Administered 2012-01-25 – 2012-01-31 (×9): 3 mL via INTRAVENOUS

## 2012-01-24 MED ORDER — SODIUM CHLORIDE 0.9 % IV SOLN
INTRAVENOUS | Status: DC
  Administered 2012-01-24 – 2012-01-29 (×6): via INTRAVENOUS
  Administered 2012-01-30: 75 mL/h via INTRAVENOUS

## 2012-01-24 MED ORDER — DIPHENHYDRAMINE HCL 12.5 MG/5ML PO ELIX: 12.5000 mg | ORAL_SOLUTION | Freq: Four times a day (QID) | ORAL | Status: DC | PRN

## 2012-01-24 MED ORDER — VANCOMYCIN HCL 1000 MG IV SOLR
1250.0000 mg | Freq: Two times a day (BID) | INTRAVENOUS | Status: DC
  Administered 2012-01-24 – 2012-01-31 (×14): 1250 mg via INTRAVENOUS
  Filled 2012-01-24 (×15): qty 1250

## 2012-01-24 MED ORDER — HEPARIN SODIUM (PORCINE) 5000 UNIT/ML IJ SOLN
5000.0000 [IU] | Freq: Three times a day (TID) | INTRAMUSCULAR | Status: DC
  Administered 2012-01-24 – 2012-01-29 (×14): 5000 [IU] via SUBCUTANEOUS
  Filled 2012-01-24 (×18): qty 1

## 2012-01-24 MED ORDER — NYSTATIN 100000 UNIT/ML MT SUSP
5.0000 mL | Freq: Four times a day (QID) | OROMUCOSAL | Status: DC
  Administered 2012-01-24 – 2012-01-31 (×26): 500000 [IU] via ORAL
  Filled 2012-01-24 (×31): qty 5

## 2012-01-24 MED ORDER — SIMVASTATIN 20 MG PO TABS
20.0000 mg | ORAL_TABLET | Freq: Every day | ORAL | Status: DC
  Administered 2012-01-24: 20 mg via ORAL
  Filled 2012-01-24 (×2): qty 1

## 2012-01-24 NOTE — H&P (Signed)
[name] is an 56 y.o. female.   Chief Complaint: abdominal pain s/p anterior lumbar fusion HPI: [name] underwent an anterior lumbar fusion at L4/5 on 01/03/2012. Post op she has complained of significant pain in the abdomen and lower back. Due to persistent complaints I ordered an abdominal and pelvic CT. There was a suggestion of a possible infection in the abdominal wall. I admitted her today for evaluation, iv antibiotics, and continued PT  Past Medical History  Diagnosis Date  . DVT (deep venous thrombosis)     Recurrent  . Adrenal mass 03/226    Benign  . Lupus Anticoagulant Positive   . Clotting disorder     +beta-2-glycoprotein IgA antibody  . History of cocaine abuse     Remote history   . Heart murmur   . Hypertension     takes meds daily  . Depression   . Shortness of breath     exertion or lying flat  . 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 Sleep apnea     2l of oxygen at night  . On home oxygen therapy     at night  . Diabetes mellitus     120s usually fasting  . GERD (gastroesophageal reflux disease)   . Dizziness, nonspecific   . Arthritis     knees/multiple orthopedic conditons; lower back  . History of blood transfusion   . Fibromyalgia     Past Surgical History  Procedure Date  . Carpal tunnel release     Bilateral  . Abdominal hysterectomy 1989    Fibroids  . Back surgery     cervical spine---disk disease  . Cesarean section   . Replacement total knee 11/17/2008    bilateral  . Right elbow surgery   . Tubal ligation   . Gallstones removed   . Anterior lumbar fusion 01/03/2012    Procedure: ANTERIOR LUMBAR FUSION 1 LEVEL;  Surgeon: Carmela Hurt, MD;  Location: [facility];  Service: Neurosurgery;  Laterality: N/A;  Lumbar Four-Five Anterior Lumbar Interbody Fusion with Instrumentation    Family History  Problem Relation Age of Onset  . Hypertension Father   . Heart failure Father   .  Hypertension Mother   . Diabetes Mother   . Cancer Mother     Pancreatic   Social History:  reports that she has never smoked. She has never used smokeless tobacco. She reports that she does not drink alcohol or use illicit drugs.  Allergies:  Allergies  Allergen Reactions  . Corticosteroids Rash    States she developed a rash on her tongue after a steroid injection in her back    Medications Prior to Admission  Medication Sig Dispense Refill  . albuterol (PROVENTIL HFA;VENTOLIN HFA) 108 (90 BASE) MCG/ACT inhaler Inhale 2 puffs into the lungs every 4 (four) hours as needed. For shortness of breath      . amLODipine (NORVASC) 5 MG tablet Take 5 mg by mouth daily.       . budesonide-formoterol (SYMBICORT) 80-4.5 MCG/ACT inhaler Inhale 2 puffs into the lungs 2 (two) times daily.       . cloNIDine (CATAPRES) 0.1 MG tablet Take 0.1 mg by mouth 2 (two) times daily.       . cyclobenzaprine (FLEXERIL) 10 MG tablet Take 1 tablet (10 mg total) by mouth 2 (two) times daily as needed for muscle spasms.  20 tablet  0  . diazepam (  VALIUM) 5 MG tablet Take 5 mg by mouth every 6 (six) hours as needed. For muscle spasms      . diltiazem (CARDIZEM CD) 180 MG 24 hr capsule Take 180 mg by mouth daily.       . DULoxetine (CYMBALTA) 60 MG capsule Take 60 mg by mouth daily.       Marland Kitchen gabapentin (NEURONTIN) 300 MG capsule Take 300 mg by mouth Three times a day.       . loratadine (CLARITIN) 10 MG tablet Take 10 mg by mouth daily.       Marland Kitchen nystatin (MYCOSTATIN) 100000 UNIT/ML suspension Take 100,000 Units by mouth Three times a day. Until all gone      . omeprazole (PRILOSEC) 20 MG capsule Take 20 mg by mouth 2 (two) times daily.      Marland Kitchen oxyCODONE-acetaminophen (PERCOCET) 10-325 MG per tablet Take 1-3 tablets by mouth every 4 (four) hours as needed. For pain.      . polyethylene glycol (MIRALAX / GLYCOLAX) packet Take 17 g by mouth daily.       . QUEtiapine (SEROQUEL) 50 MG tablet Take 50 mg by mouth 2 (two) times  daily.       . simvastatin (ZOCOR) 20 MG tablet Take 20 mg by mouth at bedtime.       Marland Kitchen warfarin (COUMADIN) 5 MG tablet Take 10-15 mg by mouth daily. 2 tabs (10 mg total) MWFSun; All other days 3 tabs (15 mg total)      . metFORMIN (GLUCOPHAGE) 500 MG tablet Take 500 mg by mouth daily with breakfast.        Results for orders placed during the hospital encounter of 01/24/12 (from the past 48 hour(s))  PROTIME-INR     Status: Abnormal   Collection Time   01/24/12  6:15 PM      Component Value Range Comment   Prothrombin Time 21.4 (*) 11.6 - 15.2 seconds    INR 1.94 (*) 0.00 - 1.49   GLUCOSE, CAPILLARY     Status: Abnormal   Collection Time   01/24/12 10:00 PM      Component Value Range Comment   Glucose-Capillary 117 (*) 70 - 99 mg/dL    Comment 1 Documented in Chart      Comment 2 Notify RN      Ct Pelvis W Contrast  01/23/2012  *RADIOLOGY REPORT*  Clinical Data:  Back and left leg pain.  Left lower quadrant pain. Previous anterior lumbar fusion.  CT PELVIS WITH CONTRAST  Technique:  Multidetector CT imaging of the pelvis was performed using the standard protocol following the bolus administration of intravenous contrast.  Contrast: OMNIPAQUE IOHEXOL 300 MG/ML  SOLN  Comparison:  11/30/2011 CT myelogram.  Findings:  Recent left lower quadrant incision related to placement of anterior lumbar interbody fusion with Synthes artificial disc at L4-L5.  There is considerable inflammatory change in the left panniculus with a focal fluid collection as seen on image 27 approximately 37 x 72 mm. Suspect developing abscess/cellulitis.  The left iliac artery is displaced anteriorly from the retroperitoneum is compared to the right with a retro iliac 26 x 45 mm hypodense collection. Is maximal anterior to L5 on the left. This extends cranially and caudally and is associated with some peripheral enhancement (see image 18 series 3).  Developing abscess suspected although resolving hematoma not excluded.   Pseudoaneurysm is not favored.  Active extravasation felt unlikely.  Synthes anterior lumbar interbody disc L4-L5 shows slight  subsidence into the superior endplate of L5, changed from the anterior C-arm appearance from 01/03/2012.  I believe this represents subsidence into an osteoporotic endplate coupled with the patient's morbid obesity.  No definite signs of infection. Advanced facet arthropathy throughout, worst at L4-5 and L5-S1.  Hyperdense/enhancing central leftward ventral epidural soft tissue extends cephalad from the L4-5 interspace (images 10 - 15 series 3) upward behind L4.  Ventral epidural hematoma versus early epidural abscess not excluded.  Possible left L4 nerve root impingement.  The left L4 screw projects outside the vertebral body into the paraspinous soft tissues.  The right L4 screw is entirely intraosseous.  The left L5 screw tip is minimally extraosseous. The right L5 screw is entirely intraosseous. I cannot exclude slight loosening of the right L5 screw at its tip (image 12 series 104, image 24 series 105.)  IMPRESSION: Superficial inflammatory change in the left panniculus with ill- defined 37 x 72 mm fluid collection, possible abscess.  Subsidence of the artificial disc into the superior endplate of L5. No definite signs of infection.  Slight extraosseous tip location left L4 and left L5 screws.  Cannot exclude loosening right L5 screw tip.  Enhancing/hyperdense central and leftward ventral epidural process extending upward behind L4; hematoma versus epidural abscess not excluded.  Peripherally enhancing hypodense fluid collection anterior to the vertebral body at L5, displacing the left iliac artery anteriorly. Abscess not excluded.   Original Report Authenticated By: Davonna Belling, M.D.     Review of Systems  Respiratory: Negative.   Gastrointestinal:       Incisional pain  Musculoskeletal: Positive for back pain.  Positive for eyeglasses, sore throat, hypertension, swelling in  the feet, leg pain with walking, asthma, chronic cough, bronchitis, vomiting, abdominal pain, anxiety, depression and diabetes. She denies allergic, hematologic, neurological, skin, musculoskeletal, GU, and constitutional problems.    Blood pressure 146/63, pulse 85, temperature 98.2 F (36.8 C), temperature source Oral, resp. rate 20, height 5\' 7"  (1.702 m), weight 127.574 kg (281 lb 4 oz), SpO2 99.00%. Physical Exam  Constitutional: She appears well-developed.  HENT:  Head: Normocephalic.  Neck: Normal range of motion. Neck supple.  Cardiovascular: Normal rate and regular rhythm.   Respiratory: Effort normal and breath sounds normal.  Musculoskeletal: Normal range of motion.  Skin: Skin is warm and dry.  Psychiatric: She has a normal mood and affect. Her behavior is normal. Judgment and thought content normal.    Alert and oriented x 4, speech clear and fluent Perrl, full eom Tongue And uvula midline 5/5 strength in all extremities Abd: Incisional pain, no erythema, wound appears clean and is dry.   Assessment/Plan Admit for pain control.  CV consult Possible IV antibiotics  Akilah Cureton L 01/24/2012, 11:41 PM

## 2012-01-24 NOTE — Progress Notes (Signed)
ANTIBIOTIC CONSULT NOTE - INITIAL  Pharmacy Consult for vancomycin Indication: abdominal wall abscess  Allergies  Allergen Reactions  . Corticosteroids Rash    States she developed a rash on her tongue after a steroid injection in her back    Patient Measurements: Height: 5\' 7"  (170.2 cm) Weight: 281 lb 4 oz (127.574 kg) IBW/kg (Calculated) : 61.6    Vital Signs: Temp: 97.9 F (36.6 C) (11/06 1843) Temp src: Oral (11/06 1843) BP: 130/64 mmHg (11/06 1843) Pulse Rate: 85  (11/06 2003) Intake/Output from previous day:   Intake/Output from this shift:    Labs: No results found for this basename: WBC:3,HGB:3,PLT:3,LABCREA:3,CREATININE:3 in the last 72 hours Estimated Creatinine Clearance: 109.1 ml/min (by C-G formula based on Cr of 0.66). No results found for this basename: VANCOTROUGH:2,VANCOPEAK:2,VANCORANDOM:2,GENTTROUGH:2,GENTPEAK:2,GENTRANDOM:2,TOBRATROUGH:2,TOBRAPEAK:2,TOBRARND:2,AMIKACINPEAK:2,AMIKACINTROU:2,AMIKACIN:2, in the last 72 hours   Microbiology: Recent Results (from the past 720 hour(s))  SURGICAL PCR SCREEN     Status: Normal   Collection Time   12/26/11 10:07 AM      Component Value Range Status Comment   MRSA, PCR NEGATIVE  NEGATIVE Final    Staphylococcus aureus NEGATIVE  NEGATIVE Final     Medical History: Past Medical History  Diagnosis Date  . DVT (deep venous thrombosis)     Recurrent  . Adrenal mass 03/226    Benign  . Lupus Anticoagulant Positive   . Clotting disorder     +beta-2-glycoprotein IgA antibody  . History of cocaine abuse     Remote history   . Heart murmur   . Hypertension     takes meds daily  . Depression   . Shortness of breath     exertion or lying flat  . 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 Sleep apnea     2l of oxygen at night  . On home oxygen therapy     at night  . Diabetes mellitus     120s usually fasting  . GERD (gastroesophageal reflux disease)   .  Dizziness, nonspecific   . Arthritis     knees/multiple orthopedic conditons; lower back  . History of blood transfusion   . Fibromyalgia     Medications:  Prescriptions prior to admission  Medication Sig Dispense Refill  . albuterol (PROVENTIL HFA;VENTOLIN HFA) 108 (90 BASE) MCG/ACT inhaler Inhale 2 puffs into the lungs every 4 (four) hours as needed. For shortness of breath      . amLODipine (NORVASC) 5 MG tablet Take 5 mg by mouth daily.       . budesonide-formoterol (SYMBICORT) 80-4.5 MCG/ACT inhaler Inhale 2 puffs into the lungs 2 (two) times daily.       . cloNIDine (CATAPRES) 0.1 MG tablet Take 0.1 mg by mouth 2 (two) times daily.       . cyclobenzaprine (FLEXERIL) 10 MG tablet Take 1 tablet (10 mg total) by mouth 2 (two) times daily as needed for muscle spasms.  20 tablet  0  . diazepam (VALIUM) 5 MG tablet Take 5 mg by mouth every 6 (six) hours as needed. For muscle spasms      . diltiazem (CARDIZEM CD) 180 MG 24 hr capsule Take 180 mg by mouth daily.       . DULoxetine (CYMBALTA) 60 MG capsule Take 60 mg by mouth daily.       Marland Kitchen gabapentin (NEURONTIN) 300 MG capsule Take 300 mg by mouth Three times a day.       Marland Kitchen  loratadine (CLARITIN) 10 MG tablet Take 10 mg by mouth daily.       Marland Kitchen nystatin (MYCOSTATIN) 100000 UNIT/ML suspension Take 100,000 Units by mouth Three times a day. Until all gone      . omeprazole (PRILOSEC) 20 MG capsule Take 20 mg by mouth 2 (two) times daily.      Marland Kitchen oxyCODONE-acetaminophen (PERCOCET) 10-325 MG per tablet Take 1-3 tablets by mouth every 4 (four) hours as needed. For pain.      . polyethylene glycol (MIRALAX / GLYCOLAX) packet Take 17 g by mouth daily.       . QUEtiapine (SEROQUEL) 50 MG tablet Take 50 mg by mouth 2 (two) times daily.       . simvastatin (ZOCOR) 20 MG tablet Take 20 mg by mouth at bedtime.       Marland Kitchen warfarin (COUMADIN) 5 MG tablet Take 10-15 mg by mouth daily. 2 tabs (10 mg total) MWFSun; All other days 3 tabs (15 mg total)      .  metFORMIN (GLUCOPHAGE) 500 MG tablet Take 500 mg by mouth daily with breakfast.       Assessment: 56 yo F to start vancomycin for abdominal wall abscess.  Deshaies of Therapy:  Vancomycin trough level 15-20 mcg/ml  Plan:  - Vancomycin 1250 mg IV q12h - Follow up SCr, UOP, cultures, clinical course and adjust as clinically indicated.  Shunda Rabadi L. Illene Bolus, PharmD, BCPS Clinical Pharmacist Pager: [phone] Pharmacy: [phone] 01/24/2012 9:07 PM

## 2012-01-24 NOTE — Progress Notes (Signed)
Pharmacy clarification:   Spoke with Dr Yetta Barre (on call MD) to clarify that SQ heparin 5000 units q8h was desired in this patient with INR 1.94 on coumadin PTA for h/o B DVT.    He wishes to continue SQ heparin for now and not to resume Coumadin at this time.  Ghada Abbett L. Illene Bolus, PharmD, BCPS Clinical Pharmacist Pager: [phone] Pharmacy: [phone] 01/24/2012 7:19 PM

## 2012-01-24 NOTE — Progress Notes (Addendum)
[facility] [place]  Subjective   History of anterior lumbar fusion with exposure by Dr. Myra Gianotti 01-03-12.  She comes to the ER with a three day history with medial incision pain.  No opening in the skin.  She denise fever or chills, but states her abdomin felt warm to touch.  Ct scan was done yesterday and showed fluid collection in the adipose tissue inferior to the incision.   Objective 138/78 85 97.8 F (36.6 C) (Oral) 20 100% No intake or output data in the 24 hours ending 01/24/12 1412  Abdomen  Incision is clean Dry and intact.  Tenderness to palpation min. Lateral and moderately medial. Otherwise abdomin is soft and non-tender to palp. Indurated area medial aspect of wound, lateral aspect softer Distal lower extremity palp DP/PT pulses intact.  CT IMPRESSION:  Superficial inflammatory change in the left panniculus with ill-  defined 37 x 72 mm fluid collection, possible abscess.  Subsidence of the artificial disc into the superior endplate of L5.  No definite signs of infection. Slight extraosseous tip location  left L4 and left L5 screws. Cannot exclude loosening right L5  screw tip.  Enhancing/hyperdense central and leftward ventral epidural process  extending upward behind L4; hematoma versus epidural abscess not  excluded.  Peripherally enhancing hypodense fluid collection anterior to the  vertebral body at L5, displacing the left iliac artery anteriorly.  Abscess not excluded.    Assessment/Planning: S/P anterior lumbar fusion with exposure by Dr. Myra Gianotti CT Superficial inflammatory change in the left panniculus with ill- defined 37 x 72 mm fluid collection, possible abscess.  Plan per [facility] Dr Hart [place] reviewed scan recommends antibiotics and moist heat to area of incision Pt on coumadin PT/INR pend Other labs pending Dr. Myra Gianotti will see in am.   Thomasena Edis [facility] 01/24/2012 2:12 PM --  Laboratory Lab Results: No  results found for this basename: WBC:2,HGB:2,HCT:2,PLT:2 in the last 72 hours BMET No results found for this basename: NA:2,K:2,CL:2,CO2:2,GLUCOSE:2,BUN:2,CREATININE:2,CALCIUM:2 in the last 72 hours  COAG Lab Results  Component Value Date   INR 2.82* 01/11/2012   INR 2.47* 01/10/2012   INR 1.92* 01/09/2012   PROTIME 13.2 01/02/2012   PROTIME 46.8* 12/27/2011   PROTIME Sent out for confirmation. 12/22/2011   No results found for this basename: PTT    Antibiotics Anti-infectives    None

## 2012-01-25 ENCOUNTER — Other Ambulatory Visit: Payer: Medicare Other | Admitting: Lab

## 2012-01-25 ENCOUNTER — Ambulatory Visit: Payer: Medicare Other

## 2012-01-25 LAB — CBC WITH DIFFERENTIAL/PLATELET
Basophils Absolute: 0 10*3/uL (ref 0.0–0.1)
Basophils Relative: 0 % (ref 0–1)
Eosinophils Absolute: 0.2 10*3/uL (ref 0.0–0.7)
Eosinophils Relative: 4 % (ref 0–5)
HCT: 29.4 % — ABNORMAL LOW (ref 36.0–46.0)
Hemoglobin: 9 g/dL — ABNORMAL LOW (ref 12.0–15.0)
Lymphocytes Relative: 32 % (ref 12–46)
Lymphs Abs: 1.7 10*3/uL (ref 0.7–4.0)
MCH: 25.1 pg — ABNORMAL LOW (ref 26.0–34.0)
MCHC: 30.6 g/dL (ref 30.0–36.0)
MCV: 81.9 fL (ref 78.0–100.0)
Monocytes Absolute: 0.6 10*3/uL (ref 0.1–1.0)
Monocytes Relative: 10 % (ref 3–12)
Neutro Abs: 2.9 10*3/uL (ref 1.7–7.7)
Neutrophils Relative %: 54 % (ref 43–77)
Platelets: 572 10*3/uL — ABNORMAL HIGH (ref 150–400)
RBC: 3.59 MIL/uL — ABNORMAL LOW (ref 3.87–5.11)
RDW: 18.1 % — ABNORMAL HIGH (ref 11.5–15.5)
WBC: 5.4 10*3/uL (ref 4.0–10.5)

## 2012-01-25 LAB — GLUCOSE, CAPILLARY
Glucose-Capillary: 128 mg/dL — ABNORMAL HIGH (ref 70–99)
Glucose-Capillary: 119 mg/dL — ABNORMAL HIGH (ref 70–99)
Glucose-Capillary: 157 mg/dL — ABNORMAL HIGH (ref 70–99)
Glucose-Capillary: 129 mg/dL — ABNORMAL HIGH (ref 70–99)

## 2012-01-25 LAB — APTT: aPTT: 46 seconds — ABNORMAL HIGH (ref 24–37)

## 2012-01-25 LAB — PROTIME-INR
INR: 1.88 — ABNORMAL HIGH (ref 0.00–1.49)
Prothrombin Time: 20.9 seconds — ABNORMAL HIGH (ref 11.6–15.2)

## 2012-01-25 MED ORDER — ATORVASTATIN CALCIUM 10 MG PO TABS
10.0000 mg | ORAL_TABLET | Freq: Every day | ORAL | Status: DC
  Administered 2012-01-25 – 2012-01-31 (×7): 10 mg via ORAL
  Filled 2012-01-25 (×7): qty 1

## 2012-01-25 NOTE — Progress Notes (Signed)
Utilization review completed. Elner Seifert, RN, BSN. 

## 2012-01-25 NOTE — Progress Notes (Signed)
Pt c/o severe abdominal pain. Pt also NPO. Only pain medications ordered were PO. Dr. Franky Macho notified. Order received for full dose dilaudid pca and a diet as well as ACHS CBG's obtained. Pt states PCA has helped her pain.

## 2012-01-25 NOTE — Progress Notes (Signed)
Patient ID: [name], female   DOB: 1955/10/16, 56 y.o.   MRN: [id number] BP 125/65  Pulse 88  Temp 97.8 F (36.6 C) (Oral)  Resp 18  Ht 5\' 7"  (1.702 m)  Wt 127.574 kg (281 lb 4 oz)  BMI 44.05 kg/m2  SpO2 99% Alert and oriented x 4 Abdomen still painful. abx to continue Will send CBC and sed rate.  Appreciate Dr. Myra Gianotti assistance.

## 2012-01-25 NOTE — Progress Notes (Signed)
[facility] [place]  Subjective  -   Still c/o abdominal pain today   Physical Exam:  Minimal to no erythema around incision.  There is some induration in the mid-portion No drainage       Assessment/Plan:    I have reviewed her CT scan.  At this point I would recommend a trial of antibiotics to see if she improves.  I discussed with her that I may need to open up her incision to drain out any infection, however, I did not see a significant amount of fluid on her CT scan at this time  Carmella Kees IV, V. WELLS 01/25/2012 11:02 AM --  Filed Vitals:   01/25/12 0753  BP:   Pulse:   Temp:   Resp: 17   No intake or output data in the 24 hours ending 01/25/12 1102   Laboratory CBC    Component Value Date/Time   WBC 6.6 01/16/2012 2137   WBC 4.3 10/16/2011 0911   HGB 9.3* 01/16/2012 2137   HGB 11.4* 10/16/2011 0911   HCT 29.1* 01/16/2012 2137   HCT 34.6* 10/16/2011 0911   PLT 680* 01/16/2012 2137   PLT 322 10/16/2011 0911    BMET    Component Value Date/Time   NA 136 01/16/2012 2137   K 4.5 01/16/2012 2137   CL 99 01/16/2012 2137   CO2 27 01/16/2012 2137   GLUCOSE 112* 01/16/2012 2137   BUN 8 01/16/2012 2137   CREATININE 0.66 01/16/2012 2137   CALCIUM 9.3 01/16/2012 2137   GFRNONAA >90 01/16/2012 2137   GFRAA >90 01/16/2012 2137    COAG Lab Results  Component Value Date   INR 1.94* 01/24/2012   INR 2.82* 01/11/2012   INR 2.47* 01/10/2012   PROTIME 13.2 01/02/2012   PROTIME 46.8* 12/27/2011   PROTIME Sent out for confirmation. 12/22/2011   No results found for this basename: PTT    Antibiotics Anti-infectives     Start     Dose/Rate Route Frequency Ordered Stop   01/24/12 2200   vancomycin (VANCOCIN) 1,250 mg in sodium chloride 0.9 % 250 mL IVPB        1,250 mg 166.7 mL/hr over 90 Minutes Intravenous Every 12 hours 01/24/12 2108             V. Charlena Cross, M.D. [facility] [place] Office:  [phone] Pager:  [phone]

## 2012-01-26 LAB — GLUCOSE, CAPILLARY
Glucose-Capillary: 147 mg/dL — ABNORMAL HIGH (ref 70–99)
Glucose-Capillary: 157 mg/dL — ABNORMAL HIGH (ref 70–99)
Glucose-Capillary: 96 mg/dL (ref 70–99)
Glucose-Capillary: 155 mg/dL — ABNORMAL HIGH (ref 70–99)

## 2012-01-26 LAB — SEDIMENTATION RATE: Sed Rate: 65 mm/hr — ABNORMAL HIGH (ref 0–22)

## 2012-01-26 MED ORDER — WARFARIN - PHYSICIAN DOSING INPATIENT
Freq: Every day | Status: DC
  Administered 2012-01-31: 18:00:00

## 2012-01-26 MED ORDER — WARFARIN SODIUM 7.5 MG PO TABS
15.0000 mg | ORAL_TABLET | ORAL | Status: DC
  Administered 2012-01-27 – 2012-01-30 (×2): 15 mg via ORAL
  Filled 2012-01-26 (×2): qty 2

## 2012-01-26 MED ORDER — METFORMIN HCL 500 MG PO TABS
500.0000 mg | ORAL_TABLET | Freq: Every day | ORAL | Status: DC
  Administered 2012-01-27 – 2012-01-31 (×5): 500 mg via ORAL
  Filled 2012-01-26 (×6): qty 1

## 2012-01-26 MED ORDER — WARFARIN SODIUM 10 MG PO TABS: 10.0000 mg | ORAL_TABLET | ORAL | Status: DC

## 2012-01-26 MED ORDER — WARFARIN SODIUM 10 MG PO TABS
10.0000 mg | ORAL_TABLET | ORAL | Status: DC
  Administered 2012-01-26 – 2012-01-31 (×4): 10 mg via ORAL
  Filled 2012-01-26 (×4): qty 1

## 2012-01-26 MED ORDER — WARFARIN SODIUM 7.5 MG PO TABS: 15.0000 mg | ORAL_TABLET | ORAL | Status: DC

## 2012-01-26 MED ORDER — WARFARIN SODIUM 10 MG PO TABS: 10.0000 mg | ORAL_TABLET | Freq: Every day | ORAL | Status: DC

## 2012-01-26 NOTE — Progress Notes (Signed)
VASCULAR PROGRESS NOTE  SUBJECTIVE: Mild discomfort at incision  PHYSICAL EXAM: Filed Vitals:   01/26/12 0013 01/26/12 0223 01/26/12 0400 01/26/12 0607  BP:  130/79  119/67  Pulse: 78 75  81  Temp:  98 F (36.7 C)  98.1 F (36.7 C)  TempSrc:  Oral  Oral  Resp: 18 18 15 16   Height:      Weight:      SpO2: 97% 95% 98% 97%   Incision with some induration, but no erythema or drainage.  LABS: Lab Results  Component Value Date   WBC 5.4 01/25/2012   HGB 9.0* 01/25/2012   HCT 29.4* 01/25/2012   MCV 81.9 01/25/2012   PLT 572* 01/25/2012   Lab Results  Component Value Date   INR 1.88* 01/25/2012   PROTIME 13.2 01/02/2012  CBG (last 3)   Basename 01/25/12 2253 01/25/12 1659 01/25/12 1127  GLUCAP 129* 157* 119*   ASSESSMENT/PLAN: 1. Patient is on Vancomycin. Plan to continue IV ABx over the weekend. Pt is afebrile with normal WBC. At this point we do not think that she needs I&D for SQ inflammation seen on CT scan. 2. On SQ heparin for DVT prophylaxis 3. DM: CBG's well controlled.  Waverly Ferrari, MD, FACS Beeper: [phone] 01/26/2012

## 2012-01-26 NOTE — Progress Notes (Signed)
Patient ID: [name] Human, female   DOB: 27-May-1955, 56 y.o.   MRN: [id number] BP 131/78  Pulse 88  Temp 98.5 F (36.9 C) (Oral)  Resp 20  Ht 5\' 7"  (1.702 m)  Wt 127.574 kg (281 lb 4 oz)  BMI 44.05 kg/m2  SpO2 100% Alert and oriented x 4 Sed rate 65 mildly elevated though this number not unusual given recent surgery. White count is low.  Complaining of pain though wound does look good, it is indurated but again this is not unusual.  Continue antibiotics.  Restarted coumadin.

## 2012-01-26 NOTE — Clinical Documentation Improvement (Signed)
BMI DOCUMENTATION CLARIFICATION QUERY  THIS DOCUMENT IS NOT A PERMANENT PART OF THE MEDICAL RECORD        01/26/12  Dear Dr. Franky Macho Marton [place]  In an effort to better capture your patient's severity of illness, reflect appropriate length of stay and utilization of resources, a review of the patient medical record has revealed the following indicators.   Based on your clinical judgment, please clarify and document in a progress note and/or discharge summary the clinical condition associated with the following supporting information: In responding to this query please exercise your independent judgment.  The fact that a query is asked, does not imply that any particular answer is desired or expected.   Hello Dr. Franky Macho!   According to the documented Height and Weight in CHL/EPIC, the patients BMI is 44. If your clinical findings/judgment agrees with this,if possible could you please help clarify the suspected diagnosis in the progress note and discharge summary. THANK YOU!    [facility]ST PRACTICE: A diagnosis of UNDERWEIGHT or MORBID OBESITY should have the BMI documented along with it.  Possible Clinical Conditions?  - Morbid Obesity  - Other condition (please document in the progress notes and/or discharge summary)  - Cannot Clinically determine at this time   Supporting Information:  Estimated Body mass index is 44.05 kg/(m^2) as calculated from the following:   Height as of this encounter: 5\' 7" (1.702 m).   Weight as of this encounter: 281 lb 4 oz(127.574 kg).    Reviewed: additional documentation in the medical record   Thank You,  Saul Fordyce  Clinical Documentation Specialist: [phone] Pager  Health Information Management [place]

## 2012-01-27 LAB — BASIC METABOLIC PANEL
BUN: 11 mg/dL (ref 6–23)
CO2: 27 mEq/L (ref 19–32)
Calcium: 9.2 mg/dL (ref 8.4–10.5)
Chloride: 102 mEq/L (ref 96–112)
Creatinine, Ser: 0.68 mg/dL (ref 0.50–1.10)
GFR calc Af Amer: >90 mL/min (ref >90)
GFR calc non Af Amer: >90 mL/min (ref >90)
Glucose, Bld: 132 mg/dL — ABNORMAL HIGH (ref 70–99)
Potassium: 3.9 mEq/L (ref 3.5–5.1)
Sodium: 138 mEq/L (ref 135–145)

## 2012-01-27 LAB — GLUCOSE, CAPILLARY
Glucose-Capillary: 148 mg/dL — ABNORMAL HIGH (ref 70–99)
Glucose-Capillary: 165 mg/dL — ABNORMAL HIGH (ref 70–99)
Glucose-Capillary: 139 mg/dL — ABNORMAL HIGH (ref 70–99)
Glucose-Capillary: 166 mg/dL — ABNORMAL HIGH (ref 70–99)

## 2012-01-27 LAB — PROTIME-INR
INR: 1.66 — ABNORMAL HIGH (ref 0.00–1.49)
Prothrombin Time: 19.1 seconds — ABNORMAL HIGH (ref 11.6–15.2)

## 2012-01-27 NOTE — Progress Notes (Signed)
Encouraged patient to get OOB to chair this morning, patient did not want to, said she would do this later. Will re-attempt ambulation later today.  Minor, [name]

## 2012-01-27 NOTE — Progress Notes (Signed)
ANTIBIOTIC CONSULT NOTE - Follow up  Pharmacy Consult for vancomycin Indication: abdominal wall abscess   Patient Measurements: Height: 5\' 7"  (170.2 cm) Weight: 281 lb 4 oz (127.574 kg) IBW/kg (Calculated) : 61.6    Vital Signs: Temp: 97.8 F (36.6 C) (11/09 1034) Temp src: Oral (11/09 1034) BP: 131/61 mmHg (11/09 1034) Pulse Rate: 87  (11/09 1034) Intake/Output from previous day: 11/08 0701 - 11/09 0700 In: 723 [P.O.:720; I.V.:3] Out: -  Intake/Output from this shift:    Labs:  Basename 01/27/12 0545 01/25/12 2137  WBC -- 5.4  HGB -- 9.0*  PLT -- 572*  LABCREA -- --  CREATININE 0.68 --   Estimated Creatinine Clearance: 109.1 ml/min (by C-G formula based on Cr of 0.68). No results found for this basename: VANCOTROUGH:2,VANCOPEAK:2,VANCORANDOM:2,GENTTROUGH:2,GENTPEAK:2,GENTRANDOM:2,TOBRATROUGH:2,TOBRAPEAK:2,TOBRARND:2,AMIKACINPEAK:2,AMIKACINTROU:2,AMIKACIN:2, in the last 72 hours   Microbiology: No results found for this or any previous visit (from the past 720 hour(s)).  Assessment: 56 yo F on vancomycin day #4 for abdominal wall abscess. Bmet this am shows stable renal function and no need for dose adjustments. Per progress notes patient likely to continue vancomycin through the weekend, will plan on checking a trough on Monday if IV therapy is to continue.  Home warfarin has been resumed. There was some question of her dose pta but patient confirms as best as she remembers she was taking 15mg  of warfarin on T/TR/Sat and 10mg  the other days which is currently ordered. INR 1.6 this am, without bleeding issues.  Filippi of Therapy:  Vancomycin trough level 15-20 mcg/ml  Plan:  - Vancomycin 1250 mg IV q12h - Follow up SCr, UOP, cultures, clinical course and adjust as clinically indicated. - Plan on trough on Monday if IV vanc is continued  Sheppard Coil, PharmD, BCPS Clinical Pharmacist 01/27/2012 2:25 PM

## 2012-01-27 NOTE — Progress Notes (Signed)
VASCULAR PROGRESS NOTE  SUBJECTIVE: Pain at incision about the same.  PHYSICAL EXAM: Filed Vitals:   01/27/12 0236 01/27/12 0354 01/27/12 0633 01/27/12 0801  BP: 146/84  150/84   Pulse: 84  87   Temp: 98.1 F (36.7 C)  97.4 F (36.3 C)   TempSrc: Oral  Oral   Resp: 16 13 20 17   Height:      Weight:      SpO2: 98% 99% 98% 100%   Incisions without erythema or drainage. Induration persists.   LABS:  Lab Results  Component Value Date   INR 1.66* 01/27/2012   PROTIME 13.2 01/02/2012   CBG (last 3)   Basename 01/27/12 0715 01/26/12 2231 01/26/12 1649  GLUCAP 148* 155* 96   ASSESSMENT/PLAN: 1. Continue IV Vancomycin. Follow up WBC tomorrow.  2. DM: CBG's well controlled.  Waverly Ferrari, MD, FACS Beeper: [phone] 01/27/2012

## 2012-01-27 NOTE — Progress Notes (Signed)
Patient ID: [name], female   DOB: December 24, 1955, 56 y.o.   MRN: [id number] No new changes from neuro stand point. Wound continues to look the same. Continue present rx.

## 2012-01-28 LAB — PROTIME-INR
INR: 1.81 — ABNORMAL HIGH (ref 0.00–1.49)
Prothrombin Time: 20.3 seconds — ABNORMAL HIGH (ref 11.6–15.2)

## 2012-01-28 LAB — CBC
HCT: 27 % — ABNORMAL LOW (ref 36.0–46.0)
Hemoglobin: 8.5 g/dL — ABNORMAL LOW (ref 12.0–15.0)
MCH: 25.4 pg — ABNORMAL LOW (ref 26.0–34.0)
MCHC: 31.5 g/dL (ref 30.0–36.0)
MCV: 80.8 fL (ref 78.0–100.0)
Platelets: 431 10*3/uL — ABNORMAL HIGH (ref 150–400)
RBC: 3.34 MIL/uL — ABNORMAL LOW (ref 3.87–5.11)
RDW: 18 % — ABNORMAL HIGH (ref 11.5–15.5)
WBC: 4.6 10*3/uL (ref 4.0–10.5)

## 2012-01-28 LAB — GLUCOSE, CAPILLARY
Glucose-Capillary: 112 mg/dL — ABNORMAL HIGH (ref 70–99)
Glucose-Capillary: 118 mg/dL — ABNORMAL HIGH (ref 70–99)
Glucose-Capillary: 143 mg/dL — ABNORMAL HIGH (ref 70–99)

## 2012-01-28 MED ORDER — FLEET ENEMA 7-19 GM/118ML RE ENEM
1.0000 | ENEMA | Freq: Once | RECTAL | Status: AC
  Administered 2012-01-28: 1 via RECTAL
  Filled 2012-01-28: qty 1

## 2012-01-28 NOTE — Progress Notes (Signed)
VASCULAR PROGRESS NOTE  SUBJECTIVE: Pain in incision about the same.  PHYSICAL EXAM: Filed Vitals:   01/28/12 0320 01/28/12 0605 01/28/12 0750 01/28/12 0757  BP:  148/75    Pulse:  81    Temp:  97.9 F (36.6 C)    TempSrc:  Oral    Resp: 17 18 16    Height:      Weight:      SpO2: 98% 100%  99%   No change in exam.   LABS: Lab Results  Component Value Date   WBC 4.6 01/28/2012   HGB 8.5* 01/28/2012   HCT 27.0* 01/28/2012   MCV 80.8 01/28/2012   PLT 431* 01/28/2012   Lab Results  Component Value Date   CREATININE 0.68 01/27/2012   Lab Results  Component Value Date   INR 1.81* 01/28/2012   PROTIME 13.2 01/02/2012   CBG (last 3)   Basename 01/28/12 0724 01/27/12 2154 01/27/12 1625  GLUCAP 112* 166* 139*     ASSESSMENT/PLAN: 1. Afebrile. WBC remains normal. Cont IV Vancomycin. Consider D/C on po antibiotics in next few days. Dr. Myra Gianotti will be back tomorrow for final recommendation.   Waverly Ferrari, MD, FACS Beeper: [phone] 01/28/2012

## 2012-01-28 NOTE — Progress Notes (Signed)
Patient ID: [name], female   DOB: 1955/04/18, 56 y.o.   MRN: [id number] No new issues. Says wound feels about the same. Will continue present rx.

## 2012-01-28 NOTE — Progress Notes (Signed)
NCM spoke to [place] rep, Lupita Leash. Pt is active with [facility]. Will need resumption of care order if d/c home with [facility]. Isidoro Donning RN CCM Case Mgmt phone [phone]

## 2012-01-28 NOTE — Progress Notes (Signed)
Pt reports "good BM" after enema administration  Minor, Morrie Sheldon Tauheed Mcfayden

## 2012-01-28 NOTE — Progress Notes (Signed)
Pt IV infiltrated. 0.6mg  of PCA dilaudid wasted in the sink with Elnita Maxwell, Charity fundraiser as witness.  Will continue to monitor patient.

## 2012-01-29 ENCOUNTER — Inpatient Hospital Stay (HOSPITAL_COMMUNITY): Payer: Medicare Other

## 2012-01-29 LAB — PROTIME-INR
INR: 2.13 — ABNORMAL HIGH (ref 0.00–1.49)
Prothrombin Time: 22.9 seconds — ABNORMAL HIGH (ref 11.6–15.2)

## 2012-01-29 LAB — GLUCOSE, CAPILLARY
Glucose-Capillary: 139 mg/dL — ABNORMAL HIGH (ref 70–99)
Glucose-Capillary: 120 mg/dL — ABNORMAL HIGH (ref 70–99)
Glucose-Capillary: 129 mg/dL — ABNORMAL HIGH (ref 70–99)

## 2012-01-29 MED ORDER — IOHEXOL 300 MG/ML  SOLN
20.0000 mL | INTRAMUSCULAR | Status: AC
Start: 2012-01-29 — End: 2012-01-29
  Administered 2012-01-29: 20 mL via ORAL

## 2012-01-29 MED ORDER — IOHEXOL 300 MG/ML  SOLN
100.0000 mL | Freq: Once | INTRAMUSCULAR | Status: AC | PRN
  Administered 2012-01-29: 100 mL via INTRAVENOUS

## 2012-01-29 MED ORDER — IOHEXOL 300 MG/ML  SOLN
20.0000 mL | INTRAMUSCULAR | Status: AC
Start: 2012-01-29 — End: 2012-01-29

## 2012-01-29 NOTE — Progress Notes (Signed)
Still c/o abd pain, which has not changed significantly since admission  Remains afebrile with normal WBC  Abd is soft.  There is an area of "hardness" around the incision, but there is No erythema or drainage   I will repeat her CT scan today to evaluate the areas of concern first seen on Ct.  If these areas are improved or remain stable, I would d/c home with an additional 1 week of PO abx.   Durene Cal

## 2012-01-29 NOTE — Progress Notes (Signed)
Patient ID: [name], female   DOB: 11-Jun-1955, 56 y.o.   MRN: [id number] BP 147/68  Pulse 80  Temp 97.9 F (36.6 C) (Oral)  Resp 18  Ht 5\' 7"  (1.702 m)  Wt 127.574 kg (281 lb 4 oz)  BMI 44.05 kg/m2  SpO2 100% Alert and oriented x 4 CT abdomen is unchanged Dc tomorrow

## 2012-01-30 LAB — PROTIME-INR
INR: 2.5 — ABNORMAL HIGH (ref 0.00–1.49)
Prothrombin Time: 25.8 seconds — ABNORMAL HIGH (ref 11.6–15.2)

## 2012-01-30 LAB — GLUCOSE, CAPILLARY
Glucose-Capillary: 111 mg/dL — ABNORMAL HIGH (ref 70–99)
Glucose-Capillary: 129 mg/dL — ABNORMAL HIGH (ref 70–99)

## 2012-01-30 MED ORDER — SORBITOL 70 % SOLN
960.0000 mL | TOPICAL_OIL | Freq: Once | ORAL | Status: DC
  Filled 2012-01-30: qty 240

## 2012-01-30 NOTE — Progress Notes (Signed)
Discontinued Dilaudid PCA as ordered and wasted 19ml in the sink, witnessed by [employer].

## 2012-01-30 NOTE — Progress Notes (Signed)
Patient ID: [name], female   DOB: 1955/07/19, 56 y.o.   MRN: [id number] BP 153/79  Pulse 88  Temp 97.8 F (36.6 C) (Oral)  Resp 19  Ht 5\' 7"  (1.702 m)  Wt 127.574 kg (281 lb 4 oz)  BMI 44.05 kg/m2  SpO2 100% Alert and oriented x 4 Speech clear and fluent. Dc tomorrow

## 2012-01-31 LAB — PROTIME-INR
INR: 2.64 — ABNORMAL HIGH (ref 0.00–1.49)
Prothrombin Time: 26.9 seconds — ABNORMAL HIGH (ref 11.6–15.2)

## 2012-01-31 LAB — GLUCOSE, CAPILLARY
Glucose-Capillary: 123 mg/dL — ABNORMAL HIGH (ref 70–99)
Glucose-Capillary: 113 mg/dL — ABNORMAL HIGH (ref 70–99)
Glucose-Capillary: 122 mg/dL — ABNORMAL HIGH (ref 70–99)

## 2012-01-31 MED ORDER — WHITE PETROLATUM GEL
Status: AC
  Administered 2012-01-31: 0.2
  Filled 2012-01-31: qty 5

## 2012-01-31 MED ORDER — OXYCODONE-ACETAMINOPHEN 10-325 MG PO TABS: 1.0000 | ORAL_TABLET | Freq: Four times a day (QID) | ORAL | Status: DC | PRN

## 2012-01-31 MED ORDER — CIPROFLOXACIN HCL 500 MG PO TABS: 500.0000 mg | ORAL_TABLET | Freq: Two times a day (BID) | ORAL | Status: DC

## 2012-01-31 NOTE — Progress Notes (Signed)
   CARE MANAGEMENT NOTE 01/31/2012  Patient:  [name], [name]   Account Number:  [id number]  Date Initiated:  01/28/2012  Documentation initiated by:  [facility]  Subjective/Objective Assessment:   anterior lumbar fusion at L4/5 on 01/03/2012     Action/Plan:   has aide that lives 3 doors down from her home. daughter assist at home also   Anticipated DC Date:  01/31/2012   Anticipated DC Plan:  HOME W HOME HEALTH SERVICES      DC Planning Services  CM consult      [facility] Choice  Resumption Of Svcs/PTA Provider   Choice offered to / List presented to:  C-1 Patient        HH arranged  HH-2 PT      [facility] agency  [facility]   Status of service:  Completed, signed off Medicare Important Message given?   (If response is "NO", the following Medicare IM given date fields will be blank) Date Medicare IM given:   Date Additional Medicare IM given:    Discharge Disposition:  HOME W HOME HEALTH SERVICES  Per UR Regulation:    If discussed at Long Length of Stay Meetings, dates discussed:    Comments:  01/31/2012 1500 NCM spoke to pt and states no additional DME needed. She has an aide that assist with her care at home. Her daughter also helps at home. [facility] aware of pt's scheduled d/c home today with [facility] PT. IV abx was changed to po.  Isidoro Donning RN CCM Case Mgmt phone [phone]  Elliot Cousin, RN Case Manager Signed CASE MANAGEMENT Progress Notes 01/28/2012 4:28 PM NCM spoke to [place] rep, Lupita Leash. Pt is active with [facility]. Will need resumption of care order if d/c home with [facility]. Isidoro Donning RN CCM Case Mgmt phone [phone]

## 2012-01-31 NOTE — Progress Notes (Signed)
ANTIBIOTIC CONSULT NOTE - FOLLOW UP  Pharmacy Consult for Vancomycin Indication:abdominal wall abscess  Allergies  Allergen Reactions  . Corticosteroids Rash    States she developed a rash on her tongue after a steroid injection in her back   Patient Measurements: Height: 5\' 7"  (170.2 cm) Weight: 281 lb 4 oz (127.574 kg) IBW/kg (Calculated) : 61.6    Vital Signs: Temp: 98.1 F (36.7 C) (11/13 1039) Temp src: Oral (11/13 1039) BP: 136/88 mmHg (11/13 1039) Pulse Rate: 76  (11/13 1039) Intake/Output from previous day:   Intake/Output from this shift:   Microbiology: No results found for this or any previous visit (from the past 720 hour(s)).  Assessment: 56yof continues on day # 8 vancomycin for abdominal wall abscess. CT abdomen is unchanged. No BMET since 11/9 to assess renal function and UOP not being measured. She is due for a trough level but looks like the plan is for her to go home today and per Dr. Estanislado Spire note on 11/11 she will need an additional week of PO antibiotics.  Hoffman of Therapy:  Vancomycin trough level 10-15 mcg/ml  Plan:  1) Continue vancomycin 1250mg  IV q12 for now 2) Will get a trough if patient is still here tomorrow  Fredrik Rigger 01/31/2012,12:37 PM

## 2012-01-31 NOTE — Progress Notes (Signed)
Pt. discharged to home. Pt after visit summary reviewed and pt capable of re verbalizing medications and follow up appointments. Pt remains stable. No signs and symptoms of distress. Educated to return to ER in the event of SOB, dizziness, chest pain, or fainting. Rogene Meth, RN  

## 2012-02-01 LAB — GLUCOSE, CAPILLARY: Glucose-Capillary: 131 mg/dL — ABNORMAL HIGH (ref 70–99)

## 2012-02-02 ENCOUNTER — Encounter (HOSPITAL_COMMUNITY): Payer: Self-pay | Admitting: Emergency Medicine

## 2012-02-02 ENCOUNTER — Emergency Department (HOSPITAL_COMMUNITY)
Admission: EM | Admit: 2012-02-02 | Discharge: 2012-02-02 | Disposition: A | Discharge status: Home / Self Care | Payer: Medicare Other | Attending: Emergency Medicine | Admitting: Emergency Medicine

## 2012-02-02 ENCOUNTER — Emergency Department (HOSPITAL_COMMUNITY): Payer: Medicare Other

## 2012-02-02 DIAGNOSIS — K219 Gastro-esophageal reflux disease without esophagitis: Secondary | ICD-10-CM | POA: Insufficient documentation

## 2012-02-02 DIAGNOSIS — F1410 Cocaine abuse, uncomplicated: Secondary | ICD-10-CM | POA: Insufficient documentation

## 2012-02-02 DIAGNOSIS — R109 Unspecified abdominal pain: Secondary | ICD-10-CM | POA: Insufficient documentation

## 2012-02-02 DIAGNOSIS — E119 Type 2 diabetes mellitus without complications: Secondary | ICD-10-CM | POA: Insufficient documentation

## 2012-02-02 DIAGNOSIS — Z7901 Long term (current) use of anticoagulants: Secondary | ICD-10-CM | POA: Insufficient documentation

## 2012-02-02 DIAGNOSIS — Z8739 Personal history of other diseases of the musculoskeletal system and connective tissue: Secondary | ICD-10-CM | POA: Insufficient documentation

## 2012-02-02 DIAGNOSIS — J4489 Other specified chronic obstructive pulmonary disease: Secondary | ICD-10-CM | POA: Insufficient documentation

## 2012-02-02 DIAGNOSIS — F329 Major depressive disorder, single episode, unspecified: Secondary | ICD-10-CM | POA: Insufficient documentation

## 2012-02-02 DIAGNOSIS — I82409 Acute embolism and thrombosis of unspecified deep veins of unspecified lower extremity: Secondary | ICD-10-CM | POA: Insufficient documentation

## 2012-02-02 DIAGNOSIS — I10 Essential (primary) hypertension: Secondary | ICD-10-CM | POA: Insufficient documentation

## 2012-02-02 DIAGNOSIS — R894 Abnormal immunological findings in specimens from other organs, systems and tissues: Secondary | ICD-10-CM | POA: Insufficient documentation

## 2012-02-02 DIAGNOSIS — G4730 Sleep apnea, unspecified: Secondary | ICD-10-CM | POA: Insufficient documentation

## 2012-02-02 DIAGNOSIS — F3289 Other specified depressive episodes: Secondary | ICD-10-CM | POA: Insufficient documentation

## 2012-02-02 DIAGNOSIS — Z5189 Encounter for other specified aftercare: Secondary | ICD-10-CM | POA: Insufficient documentation

## 2012-02-02 DIAGNOSIS — IMO0001 Reserved for inherently not codable concepts without codable children: Secondary | ICD-10-CM | POA: Insufficient documentation

## 2012-02-02 DIAGNOSIS — Z79899 Other long term (current) drug therapy: Secondary | ICD-10-CM | POA: Insufficient documentation

## 2012-02-02 DIAGNOSIS — J449 Chronic obstructive pulmonary disease, unspecified: Secondary | ICD-10-CM | POA: Insufficient documentation

## 2012-02-02 LAB — CBC WITH DIFFERENTIAL/PLATELET
Basophils Absolute: 0 10*3/uL (ref 0.0–0.1)
Basophils Relative: 0 % (ref 0–1)
Eosinophils Absolute: 0.1 10*3/uL (ref 0.0–0.7)
Eosinophils Relative: 2 % (ref 0–5)
HCT: 29 % — ABNORMAL LOW (ref 36.0–46.0)
Hemoglobin: 9.4 g/dL — ABNORMAL LOW (ref 12.0–15.0)
Lymphocytes Relative: 27 % (ref 12–46)
Lymphs Abs: 1.4 10*3/uL (ref 0.7–4.0)
MCH: 25.3 pg — ABNORMAL LOW (ref 26.0–34.0)
MCHC: 32.4 g/dL (ref 30.0–36.0)
MCV: 78.2 fL (ref 78.0–100.0)
Monocytes Absolute: 0.5 10*3/uL (ref 0.1–1.0)
Monocytes Relative: 10 % (ref 3–12)
Neutro Abs: 3.2 10*3/uL (ref 1.7–7.7)
Neutrophils Relative %: 60 % (ref 43–77)
Platelets: 392 10*3/uL (ref 150–400)
RBC: 3.71 MIL/uL — ABNORMAL LOW (ref 3.87–5.11)
RDW: 17.8 % — ABNORMAL HIGH (ref 11.5–15.5)
WBC: 5.3 10*3/uL (ref 4.0–10.5)

## 2012-02-02 LAB — COMPREHENSIVE METABOLIC PANEL
ALT: 33 U/L (ref 0–35)
AST: 30 U/L (ref 0–37)
Albumin: 3.8 g/dL (ref 3.5–5.2)
Alkaline Phosphatase: 183 U/L — ABNORMAL HIGH (ref 39–117)
BUN: 8 mg/dL (ref 6–23)
CO2: 29 mEq/L (ref 19–32)
Calcium: 9.5 mg/dL (ref 8.4–10.5)
Chloride: 100 mEq/L (ref 96–112)
Creatinine, Ser: 0.83 mg/dL (ref 0.50–1.10)
GFR calc Af Amer: 90 mL/min — ABNORMAL LOW (ref >90)
GFR calc non Af Amer: 77 mL/min — ABNORMAL LOW (ref >90)
Glucose, Bld: 118 mg/dL — ABNORMAL HIGH (ref 70–99)
Potassium: 3.3 mEq/L — ABNORMAL LOW (ref 3.5–5.1)
Sodium: 140 mEq/L (ref 135–145)
Total Bilirubin: 0.3 mg/dL (ref 0.3–1.2)
Total Protein: 8.3 g/dL (ref 6.0–8.3)

## 2012-02-02 LAB — PROTIME-INR
INR: 2.65 — ABNORMAL HIGH (ref 0.00–1.49)
Prothrombin Time: 27 seconds — ABNORMAL HIGH (ref 11.6–15.2)

## 2012-02-02 LAB — LIPASE, BLOOD: Lipase: 19 U/L (ref 11–59)

## 2012-02-02 MED ORDER — OXYCODONE-ACETAMINOPHEN 5-325 MG PO TABS: 2.0000 | ORAL_TABLET | ORAL | Status: DC | PRN

## 2012-02-02 MED ORDER — FENTANYL CITRATE 0.05 MG/ML IJ SOLN
50.0000 ug | Freq: Once | INTRAMUSCULAR | Status: AC
  Administered 2012-02-02: 50 ug via INTRAVENOUS
  Filled 2012-02-02: qty 2

## 2012-02-02 MED ORDER — ONDANSETRON HCL 4 MG/2ML IJ SOLN
4.0000 mg | Freq: Once | INTRAMUSCULAR | Status: AC
  Administered 2012-02-02: 4 mg via INTRAVENOUS
  Filled 2012-02-02: qty 2

## 2012-02-02 NOTE — ED Provider Notes (Signed)
History     CSN: [id number]  Arrival date & time 02/02/12  1403   First MD Initiated Contact with Patient 02/02/12 1517      Chief Complaint  Patient presents with  . Abdominal Pain    (Consider location/radiation/quality/duration/timing/severity/associated sxs/prior treatment) HPI Comments: Patient presents emergency apartment today complaining of abdominal pain. She had an abdominal approach lumbar fusion on October 16 by Dr. Mikal Plane. She states she had been having some worsening abdominal pain and presented to [facility] emergency department last week. She had a CT scan done which showed a questionable abdominal wall abscess he was admitted for IV antibiotics and monitoring. She then had a repeat CT scan which was unchanged. She states that she's been taking Percocet for pain but it has not been controlling the pain and she ran out of the pain medicine yesterday. She has ongoing pain to her abdomen along the incision site and in her low back area. She denies any radiation down her legs. She denies any numbness or weakness in her legs. She denies any loss of bowel or bladder control. She denies any fevers, nausea or vomiting. She did have some loose stools this morning.   Past Medical History  Diagnosis Date  . DVT (deep venous thrombosis)     Recurrent  . Adrenal mass 03/226    Benign  . Lupus Anticoagulant Positive   . Clotting disorder     +beta-2-glycoprotein IgA antibody  . History of cocaine abuse     Remote history   . Heart murmur   . Hypertension     takes meds daily  . Depression   . Shortness of breath     exertion or lying flat  . 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 Sleep apnea     2l of oxygen at night  . On home oxygen therapy     at night  . Diabetes mellitus     120s usually fasting  . GERD (gastroesophageal reflux disease)   . Dizziness, nonspecific   . Arthritis     knees/multiple orthopedic conditons;  lower back  . History of blood transfusion   . Fibromyalgia     Past Surgical History  Procedure Date  . Carpal tunnel release     Bilateral  . Abdominal hysterectomy 1989    Fibroids  . Back surgery     cervical spine---disk disease  . Cesarean section   . Replacement total knee 11/17/2008    bilateral  . Right elbow surgery   . Tubal ligation   . Gallstones removed   . Anterior lumbar fusion 01/03/2012    Procedure: ANTERIOR LUMBAR FUSION 1 LEVEL;  Surgeon: Carmela Hurt, MD;  Location: [facility];  Service: Neurosurgery;  Laterality: N/A;  Lumbar Four-Five Anterior Lumbar Interbody Fusion with Instrumentation    Family History  Problem Relation Age of Onset  . Hypertension Father   . Heart failure Father   . Hypertension Mother   . Diabetes Mother   . Cancer Mother     Pancreatic    History  Substance Use Topics  . Smoking status: Never Smoker   . Smokeless tobacco: Never Used  . Alcohol Use: No    OB History    Grav Para Term Preterm Abortions TAB SAB Ect Mult Living                  Review of Systems  Constitutional: Negative  for fever, chills, diaphoresis and fatigue.  HENT: Negative for congestion, rhinorrhea and sneezing.   Eyes: Negative.   Respiratory: Negative for cough, chest tightness and shortness of breath.   Cardiovascular: Negative for chest pain and leg swelling.  Gastrointestinal: Positive for abdominal pain and diarrhea. Negative for nausea, vomiting and blood in stool.  Genitourinary: Negative for frequency, hematuria, flank pain and difficulty urinating.  Musculoskeletal: Positive for back pain. Negative for arthralgias.  Skin: Negative for rash.  Neurological: Negative for dizziness, speech difficulty, weakness, numbness and headaches.    Allergies  Corticosteroids  Home Medications   Current Outpatient Rx  Name  Route  Sig  Dispense  Refill  . ALBUTEROL SULFATE HFA 108 (90 BASE) MCG/ACT IN AERS   Inhalation   Inhale 2 puffs  into the lungs every 4 (four) hours as needed. For shortness of breath         . AMLODIPINE BESYLATE 5 MG PO TABS   Oral   Take 5 mg by mouth daily.          . BUDESONIDE-FORMOTEROL FUMARATE 80-4.5 MCG/ACT IN AERO   Inhalation   Inhale 2 puffs into the lungs 2 (two) times daily.          Marland Kitchen CIPROFLOXACIN HCL 500 MG PO TABS   Oral   Take 1 tablet (500 mg total) by mouth 2 (two) times daily.   20 tablet   0   . CLONIDINE HCL 0.1 MG PO TABS   Oral   Take 0.1 mg by mouth 2 (two) times daily.          . CYCLOBENZAPRINE HCL 10 MG PO TABS   Oral   Take 1 tablet (10 mg total) by mouth 2 (two) times daily as needed for muscle spasms.   20 tablet   0   . DIAZEPAM 5 MG PO TABS   Oral   Take 5 mg by mouth every 6 (six) hours as needed. For muscle spasms         . DILTIAZEM HCL ER COATED BEADS 180 MG PO CP24   Oral   Take 180 mg by mouth daily.          . DULOXETINE HCL 60 MG PO CPEP   Oral   Take 60 mg by mouth daily.          Marland Kitchen GABAPENTIN 300 MG PO CAPS   Oral   Take 300 mg by mouth Three times a day.          Marland Kitchen LORATADINE 10 MG PO TABS   Oral   Take 10 mg by mouth daily as needed. For allergies.         Marland Kitchen METFORMIN HCL 500 MG PO TABS   Oral   Take 500 mg by mouth daily with breakfast.         . NYSTATIN 100000 UNIT/ML MT SUSP   Oral   Take 100,000 Units by mouth Three times a day. Until all gone         . OMEPRAZOLE 20 MG PO CPDR   Oral   Take 20 mg by mouth 2 (two) times daily.         . OXYCODONE-ACETAMINOPHEN 10-325 MG PO TABS   Oral   Take 1 tablet by mouth every 6 (six) hours as needed for pain.   70 tablet   0   . POLYETHYLENE GLYCOL 3350 PO PACK   Oral   Take 17 g by mouth  daily.          . QUETIAPINE FUMARATE 50 MG PO TABS   Oral   Take 50 mg by mouth 2 (two) times daily.          Marland Kitchen SIMVASTATIN 20 MG PO TABS   Oral   Take 20 mg by mouth at bedtime.          . WARFARIN SODIUM 5 MG PO TABS   Oral   Take 10-15 mg by  mouth daily. 2 tabs (10 mg total) MWFSun; All other days 3 tabs (15 mg total)         . OXYCODONE-ACETAMINOPHEN 5-325 MG PO TABS   Oral   Take 2 tablets by mouth every 4 (four) hours as needed for pain.   15 tablet   0     BP 125/81  Pulse 93  Temp 98.6 F (37 C) (Oral)  Resp 20  SpO2 98%  Physical Exam  Constitutional: She is oriented to person, place, and time. She appears well-developed and well-nourished.  HENT:  Head: Normocephalic and atraumatic.  Eyes: Pupils are equal, round, and reactive to light.  Neck: Normal range of motion. Neck supple.  Cardiovascular: Normal rate, regular rhythm and normal heart sounds.   Pulmonary/Chest: Effort normal and breath sounds normal. No respiratory distress. She has no wheezes. She has no rales. She exhibits no tenderness.  Abdominal: Soft. Bowel sounds are normal. There is tenderness (Patient has some mild tenderness along the abdominal surgical incision site. There some induration in tenderness along the incision site however there is no drainage, erythema or warmth. There is no fluctuance to the area.). There is no rebound and no guarding.  Genitourinary:       She has normal tone on rectal exam without impaction.  Musculoskeletal: Normal range of motion. She exhibits no edema.       She has some pain on palpation to the lower lumbar spine. There is no dullness, erythema, or warmth.  Lymphadenopathy:    She has no cervical adenopathy.  Neurological: She is alert and oriented to person, place, and time. She has normal strength. No sensory deficit. GCS eye subscore is 4. GCS verbal subscore is 5. GCS motor subscore is 6.  Skin: Skin is warm and dry. No rash noted.  Psychiatric: She has a normal mood and affect.    ED Course  Procedures (including critical care time) Results for orders placed during the hospital encounter of 02/02/12  LIPASE, BLOOD      Component Value Range   Lipase 19  11 - 59 U/L  COMPREHENSIVE METABOLIC PANEL       Component Value Range   Sodium 140  135 - 145 mEq/L   Potassium 3.3 (*) 3.5 - 5.1 mEq/L   Chloride 100  96 - 112 mEq/L   CO2 29  19 - 32 mEq/L   Glucose, Bld 118 (*) 70 - 99 mg/dL   BUN 8  6 - 23 mg/dL   Creatinine, Ser 1.19  0.50 - 1.10 mg/dL   Calcium 9.5  8.4 - 14.7 mg/dL   Total Protein 8.3  6.0 - 8.3 g/dL   Albumin 3.8  3.5 - 5.2 g/dL   AST 30  0 - 37 U/L   ALT 33  0 - 35 U/L   Alkaline Phosphatase 183 (*) 39 - 117 U/L   Total Bilirubin 0.3  0.3 - 1.2 mg/dL   GFR calc non Af Amer 77 (*) >90 mL/min  GFR calc Af Amer 90 (*) >90 mL/min  CBC WITH DIFFERENTIAL      Component Value Range   WBC 5.3  4.0 - 10.5 K/uL   RBC 3.71 (*) 3.87 - 5.11 MIL/uL   Hemoglobin 9.4 (*) 12.0 - 15.0 g/dL   HCT 16.1 (*) 09.6 - 04.5 %   MCV 78.2  78.0 - 100.0 fL   MCH 25.3 (*) 26.0 - 34.0 pg   MCHC 32.4  30.0 - 36.0 g/dL   RDW 40.9 (*) 81.1 - 91.4 %   Platelets 392  150 - 400 K/uL   Neutrophils Relative 60  43 - 77 %   Neutro Abs 3.2  1.7 - 7.7 K/uL   Lymphocytes Relative 27  12 - 46 %   Lymphs Abs 1.4  0.7 - 4.0 K/uL   Monocytes Relative 10  3 - 12 %   Monocytes Absolute 0.5  0.1 - 1.0 K/uL   Eosinophils Relative 2  0 - 5 %   Eosinophils Absolute 0.1  0.0 - 0.7 K/uL   Basophils Relative 0  0 - 1 %   Basophils Absolute 0.0  0.0 - 0.1 K/uL  PROTIME-INR      Component Value Range   Prothrombin Time 27.0 (*) 11.6 - 15.2 seconds   INR 2.65 (*) 0.00 - 1.49   Ct Pelvis W Contrast  01/23/2012  *RADIOLOGY REPORT*  Clinical Data:  Back and left leg pain.  Left lower quadrant pain. Previous anterior lumbar fusion.  CT PELVIS WITH CONTRAST  Technique:  Multidetector CT imaging of the pelvis was performed using the standard protocol following the bolus administration of intravenous contrast.  Contrast: OMNIPAQUE IOHEXOL 300 MG/ML  SOLN  Comparison:  11/30/2011 CT myelogram.  Findings:  Recent left lower quadrant incision related to placement of anterior lumbar interbody fusion with Synthes  artificial disc at L4-L5.  There is considerable inflammatory change in the left panniculus with a focal fluid collection as seen on image 27 approximately 37 x 72 mm. Suspect developing abscess/cellulitis.  The left iliac artery is displaced anteriorly from the retroperitoneum is compared to the right with a retro iliac 26 x 45 mm hypodense collection. Is maximal anterior to L5 on the left. This extends cranially and caudally and is associated with some peripheral enhancement (see image 18 series 3).  Developing abscess suspected although resolving hematoma not excluded.  Pseudoaneurysm is not favored.  Active extravasation felt unlikely.  Synthes anterior lumbar interbody disc L4-L5 shows slight subsidence into the superior endplate of L5, changed from the anterior C-arm appearance from 01/03/2012.  I believe this represents subsidence into an osteoporotic endplate coupled with the patient's morbid obesity.  No definite signs of infection. Advanced facet arthropathy throughout, worst at L4-5 and L5-S1.  Hyperdense/enhancing central leftward ventral epidural soft tissue extends cephalad from the L4-5 interspace (images 10 - 15 series 3) upward behind L4.  Ventral epidural hematoma versus early epidural abscess not excluded.  Possible left L4 nerve root impingement.  The left L4 screw projects outside the vertebral body into the paraspinous soft tissues.  The right L4 screw is entirely intraosseous.  The left L5 screw tip is minimally extraosseous. The right L5 screw is entirely intraosseous. I cannot exclude slight loosening of the right L5 screw at its tip (image 12 series 104, image 24 series 105.)  IMPRESSION: Superficial inflammatory change in the left panniculus with ill- defined 37 x 72 mm fluid collection, possible abscess.  Subsidence of the  artificial disc into the superior endplate of L5. No definite signs of infection.  Slight extraosseous tip location left L4 and left L5 screws.  Cannot exclude  loosening right L5 screw tip.  Enhancing/hyperdense central and leftward ventral epidural process extending upward behind L4; hematoma versus epidural abscess not excluded.  Peripherally enhancing hypodense fluid collection anterior to the vertebral body at L5, displacing the left iliac artery anteriorly. Abscess not excluded.   Original Report Authenticated By: Davonna Belling, M.D.    Ct Abdomen Pelvis W Contrast  01/29/2012  *RADIOLOGY REPORT*  Clinical Data: Postop left lower quadrant pain.  CT ABDOMEN AND PELVIS WITH CONTRAST  Technique:  Multidetector CT imaging of the abdomen and pelvis was performed following the standard protocol during bolus administration of intravenous contrast.  Contrast: OMNIPAQUE IOHEXOL 300 MG/ML  SOLN  Comparison: CT pelvis 01/23/2012 and CT abdomen pelvis 11/05/2011.  Findings: Lung bases show no acute findings.  Heart is mildly enlarged.  No pericardial or pleural effusion.  Liver is decreased in attenuation diffusely.  8 mm low attenuation lesion in the left hepatic lobe, as before.  Cholecystectomy.  A 2.8 x 2.6 cm hyperattenuating lesion is seen in the region of the right adrenal gland, as before.  Left adrenal gland and right kidney are unremarkable.  There is slightly decreased attenuation of the left kidney compared to the right.  Left hydronephrosis is seen to the level of L4-5 anterior fusion.  Findings are similar to 01/23/2012.  There is no excretion of contrast on delayed renal phase imaging.  Inflammatory changes are seen along the course of the left common iliac artery, where there also appears to be a surgical clip (image 78), as before.  Spleen, pancreas, stomach and small bowel are unremarkable.  A fair amount of stool is seen in the colon. Small amount of fluid remains in the subcutaneous fat of the lower left pelvic wall, overlying the left rectus abdominus musculature, similar to 01/23/2012 . Inflammatory changes and mildly enlarged lymph nodes are seen in  the left groin and left thigh.  No free fluid.  No worrisome lytic or sclerotic lesions. The appearance of the anterior lumbar interbody fusion at L4-5 is grossly unchanged, as is a mildly hyperattenuating structure along the ventral thecal sac, the L4-5 level (series 102, image 66).  IMPRESSION:  1.  Persistent left hydronephrosis to the level of L4-5 anterior lumbar interbody fusion, where there is inflammatory fluid and stranding along the course of the left common iliac artery. Imaging findings are indicative of decreased renal function. 2.  Inflammatory changes in the subcutaneous fat of the left ventral pelvic wall, overlying the left rectus abdominus muscle, as before. 3. Appearance of the L4-5 anterior lumbar interbody fusion and hyperattenuating structure along the ventral thecal sac, at the same level, is grossly unchanged.  4.  Presumed postoperative edema and reactive adenopathy in the left groin and left lower extremity. 5.  Hepatic steatosis. 6.  Hyperattenuating right adrenal lesion has not previously characterized as a probable lipid poor adenoma, as on 12/07/2009. 7.  Constipation.   Original Report Authenticated By: Leanna Battles, M.D.    Dg Abd Acute W/chest  02/02/2012  *RADIOLOGY REPORT*  Clinical Data: Lower abdominal pain, diarrhea and appetite loss  ACUTE ABDOMEN SERIES (ABDOMEN 2 VIEW & CHEST 1 VIEW)  Comparison: Abdomen and pelvis CT, 01/29/2012  Findings: There is a single air fluid level right upper quadrant. Otherwise, the bowel gas pattern is normal.  There is no evidence  of obstruction and no free air.  There are clips in the right upper quadrant with prior cholecystectomy.  Multiple phleboliths are noted in the pelvis.  The soft tissues are otherwise unremarkable.  The included frontal radiograph the chest shows clear lungs.  There are changes from the lower anterior cervical spine fusion and a lower lumbar spine fusion.  IMPRESSION: No evidence of obstruction or free air.  No  acute findings.  No active disease on the chest radiograph.   Original Report Authenticated By: Amie Portland, M.D.       1. Abdominal pain       MDM  Patient's hemoglobin is at baseline. Her abdominal exam is benign with some induration along the incision site however this was noted from the previous hospital notes as well. She is afebrile with a normal white count. Her hemoglobin is slightly low but baseline. She has a followup appointment with Dr. Franky Macho on Monday. I feel that she is appropriate for discharge with followup with Dr. Mikal Plane on Monday. I discussed this with Dr. Mikal Plane and she is in agreement with this plan. She also had has no evidence of neurological deficits or anything to suggest an epidural hematoma or abscess  Following the patient's discharge, Dr. Franky Macho did call me back and advised me that he had prescribed the patient 6 Percocet tablets. The patient had told me that she ran out of her medication however given this number Percocet tablets, she should not have been close to running out of the medicine. Prior to having this information I had given her a prescription for Percocet 15 tablets.        Rolan Bucco, MD 02/02/12 2215

## 2012-02-02 NOTE — ED Notes (Signed)
Patient ambulated to restroom and attempted to give urine sample. Unable to void at this time

## 2012-02-02 NOTE — ED Notes (Signed)
Pt had back surgery on October 16 that went through [facility]. Since then pt has had problems with pain there.  Went to [facility] earlier in the week and was given abx. Pt states pain has not improved.  Pt alert,oriented, crying and restless. Pt states she started to have diarrhea this am.  Denies n/v.

## 2012-02-02 NOTE — ED Notes (Signed)
[id number]<AV> Expected date:<BR> Expected time:<BR> Means of arrival:Ambulance<BR> Comments:<BR> EMS- abd pain

## 2012-02-03 NOTE — Discharge Summary (Signed)
Physician Discharge Summary  Patient ID: [name] MRN: [id number] DOB/AGE: 56/23/1957 56 y.o.  Admit date: 01/24/2012 Discharge date: 02/03/2012  Admission Diagnoses: Post surgical abdominal pain, alif  Discharge Diagnoses: Post surgical abdominal pain s/p alif L4/5 Active Problems:  Morbid obesity with BMI of 45.0-49.9, adult   Discharged Condition: good  Hospital Course: Mrs. Mooradian was admitted for persistent severe abdominal wall pain. The vascular surgery group was consulted after a CT showed some fluid in the abdominal wall. Sed rate, cbc were not consistent with a severe infection. She was given Vancomycin during her hospitalization and sent home on ciprofloxacin. There were no neurologic problems noted during the exam.  Consults: vascular surgery  Significant Diagnostic Studies: none  Treatments: antibiotics: vancomycin  Discharge Exam: Blood pressure 142/87, pulse 85, temperature 97.3 F (36.3 C), temperature source Oral, resp. rate 18, height 5\' 7"  (1.702 m), weight 127.574 kg (281 lb 4 oz), SpO2 100.00%. General appearance: alert, cooperative, appears stated age, mild distress and morbidly obese Neurologic: Grossly normal  Disposition: 01-Home or Self Care  Discharge Orders    Future Appointments: Provider: Department: Dept Phone: Center:   09/05/2012 8:30 AM Delcie Roch [facility] MEDICAL ONCOLOGY [phone] None   09/05/2012 9:00 AM Ladene Artist, MD [facility] MEDICAL ONCOLOG[facility] [phone] None       Medication List     As of 02/03/2012 12:26 PM    TAKE these medications         albuterol 108 (90 BASE) MCG/ACT inhaler   Commonly known as: PROVENTIL HFA;VENTOLIN HFA   Inhale 2 puffs into the lungs every 4 (four) hours as needed. For shortness of breath      amLODipine 5 MG tablet   Commonly known as: NORVASC   Take 5 mg by mouth daily.      budesonide-formoterol 80-4.5 MCG/ACT inhaler   Commonly known as:  SYMBICORT   Inhale 2 puffs into the lungs 2 (two) times daily.      ciprofloxacin 500 MG tablet   Commonly known as: CIPRO   Take 1 tablet (500 mg total) by mouth 2 (two) times daily.      cloNIDine 0.1 MG tablet   Commonly known as: CATAPRES   Take 0.1 mg by mouth 2 (two) times daily.      cyclobenzaprine 10 MG tablet   Commonly known as: FLEXERIL   Take 1 tablet (10 mg total) by mouth 2 (two) times daily as needed for muscle spasms.      diazepam 5 MG tablet   Commonly known as: VALIUM   Take 5 mg by mouth every 6 (six) hours as needed. For muscle spasms      diltiazem 180 MG 24 hr capsule   Commonly known as: CARDIZEM CD   Take 180 mg by mouth daily.      DULoxetine 60 MG capsule   Commonly known as: CYMBALTA   Take 60 mg by mouth daily.      gabapentin 300 MG capsule   Commonly known as: NEURONTIN   Take 300 mg by mouth Three times a day.      loratadine 10 MG tablet   Commonly known as: CLARITIN   Take 10 mg by mouth daily as needed. For allergies.      metFORMIN 500 MG tablet   Commonly known as: GLUCOPHAGE   Take 500 mg by mouth daily with breakfast.      nystatin 100000 UNIT/ML suspension  Commonly known as: MYCOSTATIN   Take 100,000 Units by mouth Three times a day. Until all gone      omeprazole 20 MG capsule   Commonly known as: PRILOSEC   Take 20 mg by mouth 2 (two) times daily.      oxyCODONE-acetaminophen 10-325 MG per tablet   Commonly known as: PERCOCET   Take 1 tablet by mouth every 6 (six) hours as needed for pain.      polyethylene glycol packet   Commonly known as: MIRALAX / GLYCOLAX   Take 17 g by mouth daily.      QUEtiapine 50 MG tablet   Commonly known as: SEROQUEL   Take 50 mg by mouth 2 (two) times daily.      simvastatin 20 MG tablet   Commonly known as: ZOCOR   Take 20 mg by mouth at bedtime.      warfarin 5 MG tablet   Commonly known as: COUMADIN   Take 10-15 mg by mouth daily. 2 tabs (10 mg total) MWFSun; All other days 3  tabs (15 mg total)           Follow-up Information    Follow up with Karessa Onorato L, MD. (call to make appt)    Contact information:   [address], [address]                         [address] [place] [address] [phone]       Follow up with [facility] (Home Health Physical Therapy)    Contact information:   [phone]         Signed: Shivaan Tierno L 02/03/2012, 12:26 PM

## 2012-02-06 ENCOUNTER — Telehealth: Payer: Self-pay | Admitting: Pharmacist

## 2012-02-19 ENCOUNTER — Telehealth: Payer: Self-pay | Admitting: Oncology

## 2012-02-19 NOTE — Telephone Encounter (Signed)
pt called in and needed to sch a lab and coag appt,done per orders/notes in sys     [name]

## 2012-02-21 ENCOUNTER — Ambulatory Visit (HOSPITAL_BASED_OUTPATIENT_CLINIC_OR_DEPARTMENT_OTHER): Payer: Medicaid Other | Admitting: Pharmacist

## 2012-02-21 ENCOUNTER — Other Ambulatory Visit (HOSPITAL_BASED_OUTPATIENT_CLINIC_OR_DEPARTMENT_OTHER): Payer: Medicare Other | Admitting: Lab

## 2012-02-21 DIAGNOSIS — I82409 Acute embolism and thrombosis of unspecified deep veins of unspecified lower extremity: Secondary | ICD-10-CM

## 2012-02-21 DIAGNOSIS — I82403 Acute embolism and thrombosis of unspecified deep veins of lower extremity, bilateral: Secondary | ICD-10-CM

## 2012-02-21 DIAGNOSIS — Z5181 Encounter for therapeutic drug level monitoring: Secondary | ICD-10-CM

## 2012-02-21 DIAGNOSIS — D649 Anemia, unspecified: Secondary | ICD-10-CM

## 2012-02-21 DIAGNOSIS — Z7901 Long term (current) use of anticoagulants: Secondary | ICD-10-CM

## 2012-02-21 LAB — PROTIME-INR
INR: 3.5 (ref 2.00–3.50)
Protime: 42 Seconds — ABNORMAL HIGH (ref 10.6–13.4)

## 2012-02-21 LAB — POCT INR: INR: 3.5

## 2012-02-21 NOTE — Patient Instructions (Addendum)
INR a little above [place] No coumadin today Then resume regular schedule on Thursday Return 12/30 at 10:30 for lab and 10:45 am coumadin clinic

## 2012-02-21 NOTE — Progress Notes (Signed)
INR is elevated today at 3.5. No bleeding issues noted, no extra doses. Instructed patient to skip dose today and resume regimen tomorrow 12/5. Pt takes 10 mg MWF and Sunday with 15 mg T/TH/Sat. Return in 2-3 weeks on 12/30 with lab and coumadin clinic at 10:30am and 10:45am.

## 2012-03-18 ENCOUNTER — Other Ambulatory Visit (HOSPITAL_BASED_OUTPATIENT_CLINIC_OR_DEPARTMENT_OTHER): Payer: Medicare Other | Admitting: Lab

## 2012-03-18 ENCOUNTER — Ambulatory Visit (HOSPITAL_BASED_OUTPATIENT_CLINIC_OR_DEPARTMENT_OTHER): Payer: Medicaid Other | Admitting: Pharmacist

## 2012-03-18 DIAGNOSIS — I82403 Acute embolism and thrombosis of unspecified deep veins of lower extremity, bilateral: Secondary | ICD-10-CM

## 2012-03-18 DIAGNOSIS — I82409 Acute embolism and thrombosis of unspecified deep veins of unspecified lower extremity: Secondary | ICD-10-CM

## 2012-03-18 LAB — PROTIME-INR
INR: 3 (ref 2.00–3.50)
Protime: 36 Seconds — ABNORMAL HIGH (ref 10.6–13.4)

## 2012-03-18 LAB — POCT INR: INR: 3

## 2012-03-18 NOTE — Progress Notes (Signed)
INR = 3 on 10 mg/day; 15 mg Tu/Th/Sat No complaints today; doing well. No med changes. INR at [facility]. Cont same dose & repeat protime in 3 weeks. Marily Lente, Pharm.D.

## 2012-04-09 ENCOUNTER — Telehealth: Payer: Self-pay | Admitting: Pharmacist

## 2012-04-09 ENCOUNTER — Telehealth: Payer: Self-pay | Admitting: *Deleted

## 2012-04-09 NOTE — Telephone Encounter (Signed)
Patient canceled appts for tomorrow due to the weather. I have notified pharmacy.    JMWE

## 2012-04-10 ENCOUNTER — Ambulatory Visit: Payer: Medicare Other

## 2012-04-10 ENCOUNTER — Other Ambulatory Visit: Payer: Medicare Other | Admitting: Lab

## 2012-04-17 ENCOUNTER — Other Ambulatory Visit: Payer: Medicare Other | Admitting: Lab

## 2012-04-17 ENCOUNTER — Ambulatory Visit: Payer: Medicare Other

## 2012-04-24 ENCOUNTER — Ambulatory Visit: Payer: Medicare Other

## 2012-04-24 ENCOUNTER — Other Ambulatory Visit: Payer: Medicare Other | Admitting: Lab

## 2012-04-30 ENCOUNTER — Other Ambulatory Visit (HOSPITAL_BASED_OUTPATIENT_CLINIC_OR_DEPARTMENT_OTHER): Payer: Medicare Other | Admitting: Lab

## 2012-04-30 ENCOUNTER — Ambulatory Visit (HOSPITAL_BASED_OUTPATIENT_CLINIC_OR_DEPARTMENT_OTHER): Payer: Medicaid Other | Admitting: Pharmacist

## 2012-04-30 DIAGNOSIS — I82403 Acute embolism and thrombosis of unspecified deep veins of lower extremity, bilateral: Secondary | ICD-10-CM

## 2012-04-30 DIAGNOSIS — I82409 Acute embolism and thrombosis of unspecified deep veins of unspecified lower extremity: Secondary | ICD-10-CM

## 2012-04-30 LAB — PROTIME-INR
INR: 4.8 — ABNORMAL HIGH (ref 2.00–3.50)
Protime: 57.6 Seconds — ABNORMAL HIGH (ref 10.6–13.4)

## 2012-04-30 LAB — POCT INR: INR: 4.8

## 2012-04-30 NOTE — Patient Instructions (Addendum)
Hold dose today then resume Coumadin at 10 mg (2 tablets) daily except 15 mg (3 tablets) on Thursdays and Saturdays Return 05/14/12 for  lab at 1:30 and coumadin clinic 1:45

## 2012-04-30 NOTE — Progress Notes (Signed)
Pt returns to clinic today supratherapuetic and has not been seen in clinic since Dec.   No changes to report.  Reviewed and updated medications on file. Hold dose today then resume Coumadin at 10 mg (2 tablets) daily except 15 mg (3 tablets) on Thursdays and Saturdays Return 05/14/12 for  lab at 1:30 and coumadin clinic 1:45

## 2012-05-03 ENCOUNTER — Ambulatory Visit: Payer: Medicare Other

## 2012-05-03 ENCOUNTER — Ambulatory Visit: Payer: Medicare Other | Admitting: Lab

## 2012-05-06 ENCOUNTER — Other Ambulatory Visit (HOSPITAL_BASED_OUTPATIENT_CLINIC_OR_DEPARTMENT_OTHER): Payer: Medicare Other | Admitting: Lab

## 2012-05-06 ENCOUNTER — Ambulatory Visit (HOSPITAL_BASED_OUTPATIENT_CLINIC_OR_DEPARTMENT_OTHER): Payer: Medicare Other | Admitting: Pharmacist

## 2012-05-06 DIAGNOSIS — Z7901 Long term (current) use of anticoagulants: Secondary | ICD-10-CM

## 2012-05-06 DIAGNOSIS — I82409 Acute embolism and thrombosis of unspecified deep veins of unspecified lower extremity: Secondary | ICD-10-CM

## 2012-05-06 DIAGNOSIS — I82403 Acute embolism and thrombosis of unspecified deep veins of lower extremity, bilateral: Secondary | ICD-10-CM

## 2012-05-06 LAB — PROTIME-INR
INR: 2.3 (ref 2.00–3.50)
Protime: 27.6 Seconds — ABNORMAL HIGH (ref 10.6–13.4)

## 2012-05-06 LAB — POCT INR: INR: 2.3

## 2012-05-06 NOTE — Progress Notes (Signed)
INR = 2.3 on 10 mg/day; 15 mg Thurs/Sat Pt is "sleepy" today.  She noticed she was fatigued last week when her INR was supratherapeutic. No bleeding. Pt held 1 dose last week as instructed.  INR now back within [place]. I think we need to back off her dose a little so I will have her take 10 mg/day except 15 mg on Thursdays only. Repeat INR 2/25 as previously planned. Marily Lente, Pharm.D.

## 2012-05-14 ENCOUNTER — Ambulatory Visit (HOSPITAL_BASED_OUTPATIENT_CLINIC_OR_DEPARTMENT_OTHER): Payer: Medicaid Other | Admitting: Pharmacist

## 2012-05-14 ENCOUNTER — Other Ambulatory Visit (HOSPITAL_BASED_OUTPATIENT_CLINIC_OR_DEPARTMENT_OTHER): Payer: Medicare Other | Admitting: Lab

## 2012-05-14 DIAGNOSIS — I82403 Acute embolism and thrombosis of unspecified deep veins of lower extremity, bilateral: Secondary | ICD-10-CM

## 2012-05-14 DIAGNOSIS — I82409 Acute embolism and thrombosis of unspecified deep veins of unspecified lower extremity: Secondary | ICD-10-CM

## 2012-05-14 LAB — PROTIME-INR
INR: 3.7 — ABNORMAL HIGH (ref 2.00–3.50)
Protime: 44.4 Seconds — ABNORMAL HIGH (ref 10.6–13.4)

## 2012-05-14 NOTE — Progress Notes (Signed)
INR supratherapeutic (3.7) on 10mg  daily except 15mg  on Th No recent changes in meds- she has switched from simvastatin to pravastatin (unclear how long ago).  She thinks she has eaten fewer greens in the past couple of weeks.   No problems with bleeding or bruising.  Will have pt decrease dose to 10mg  daily, and recheck INR in 1 week.

## 2012-05-21 ENCOUNTER — Ambulatory Visit: Payer: Medicare Other

## 2012-05-21 ENCOUNTER — Other Ambulatory Visit: Payer: Medicare Other | Admitting: Lab

## 2012-05-29 ENCOUNTER — Other Ambulatory Visit (HOSPITAL_BASED_OUTPATIENT_CLINIC_OR_DEPARTMENT_OTHER): Payer: Medicare Other | Admitting: Lab

## 2012-05-29 ENCOUNTER — Ambulatory Visit (HOSPITAL_BASED_OUTPATIENT_CLINIC_OR_DEPARTMENT_OTHER): Payer: Medicaid Other | Admitting: Pharmacist

## 2012-05-29 DIAGNOSIS — I82409 Acute embolism and thrombosis of unspecified deep veins of unspecified lower extremity: Secondary | ICD-10-CM

## 2012-05-29 DIAGNOSIS — I82403 Acute embolism and thrombosis of unspecified deep veins of lower extremity, bilateral: Secondary | ICD-10-CM

## 2012-05-29 LAB — POCT INR: INR: 4.3

## 2012-05-29 LAB — PROTIME-INR
INR: 4.3 — ABNORMAL HIGH (ref 2.00–3.50)
Protime: 51.6 Seconds — ABNORMAL HIGH (ref 10.6–13.4)

## 2012-05-29 NOTE — Progress Notes (Signed)
INR = 4.3 Pt was instructed at last visit (2/25) to take 10 mg daily. She took 15 mg yesterday but was taking 10 mg other days. No bleeding/bruising. Only c/o knee pain. She ate some "greens" & had a salad this week. INR is elevated today.  Likely due to taking higher dose than was directed. No Coumadin today then change to 10 mg daily. Repeat INR 06/07/12 Ebony Hail, Pharm.D., CPP 05/29/2012@1 :39 PM

## 2012-05-30 IMAGING — MR MR ABDOMEN W/O CM
4 of 9 series · 24 of 48 positions shown · non-contrast
Comparison: Multiple prior abdominal CT scans.

CLINICAL DATA: Right adrenal gland lesion.

MRI ABDOMEN WITHOUT CONTRAST
TECHNIQUE: Multiplanar multisequence MR imaging of the abdomen was
performed. No intravenous contrast was administered.

[Series 3: T2 fat-sat · axial · 5.0mm · 0.90mm/px · z∈[-93,+102]mm · 4 of 40 slices shown]
[im 1/40]
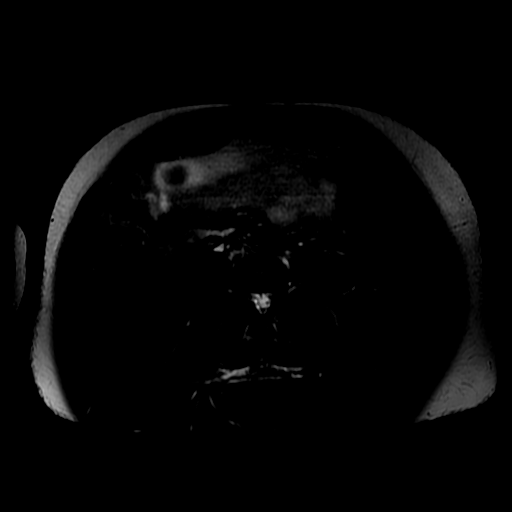
[im 14/40]
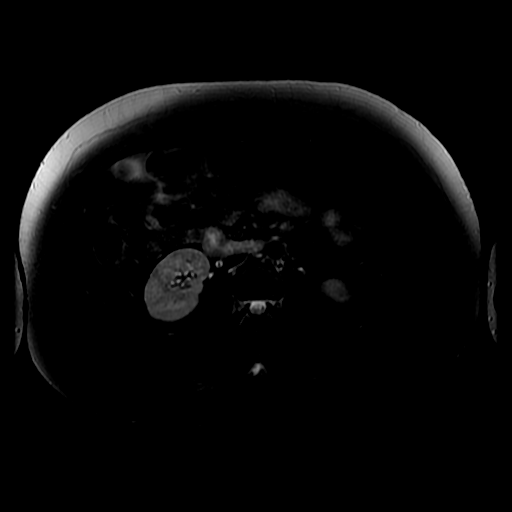
[im 27/40]
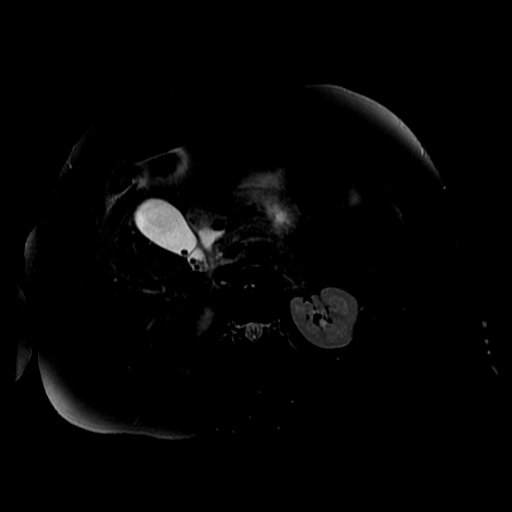
[im 40/40]
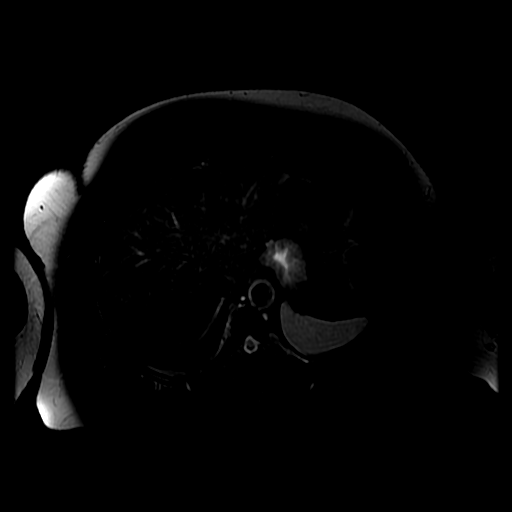

[Series 4: DWI b500 · axial · 6.0mm · 1.64mm/px · z∈[-90,+152]mm · 7 of 64 slices shown]
[im 1/64]
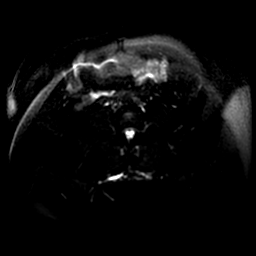
[im 11/64]
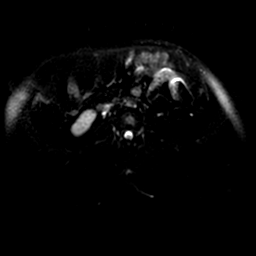
[im 22/64]
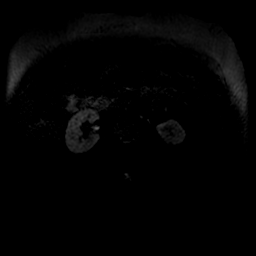
[im 32/64]
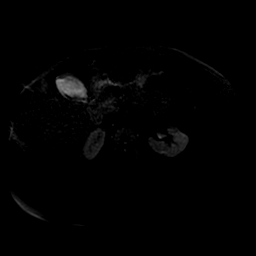
[im 43/64]
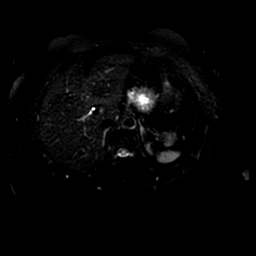
[im 53/64]
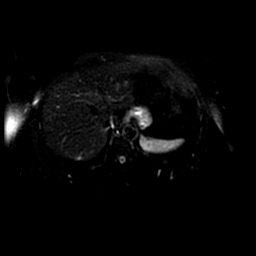
[im 64/64]
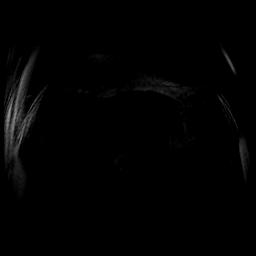

[Series 7: ax dualecho · axial · 5.0mm · 0.90mm/px · z∈[-75,+130]mm · 9 of 84 slices shown]
[im 1/84]
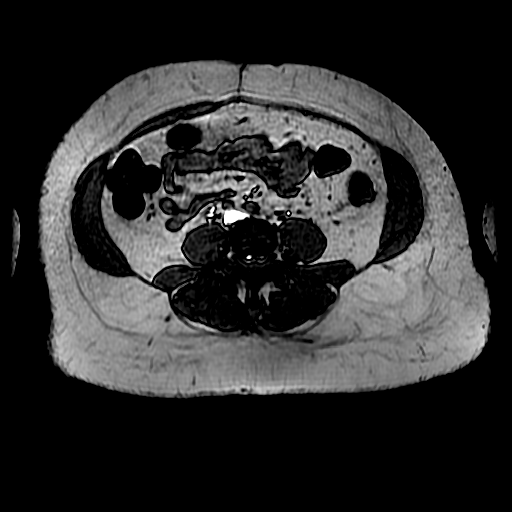
[im 11/84]
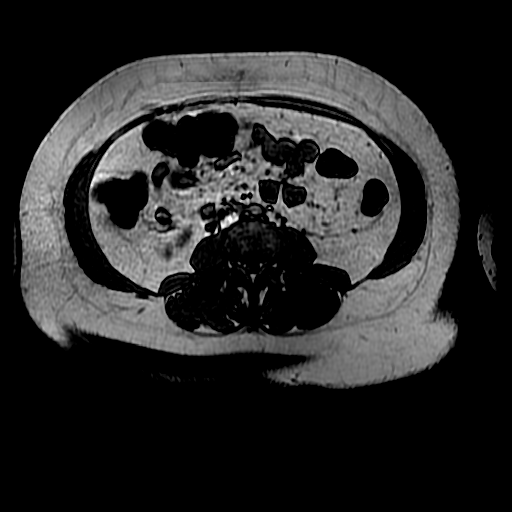
[im 21/84]
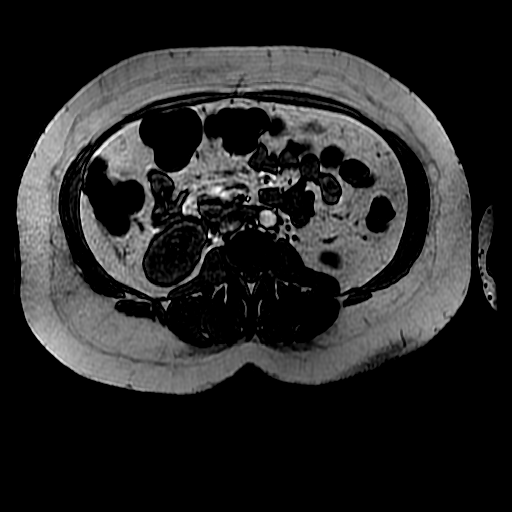
[im 32/84]
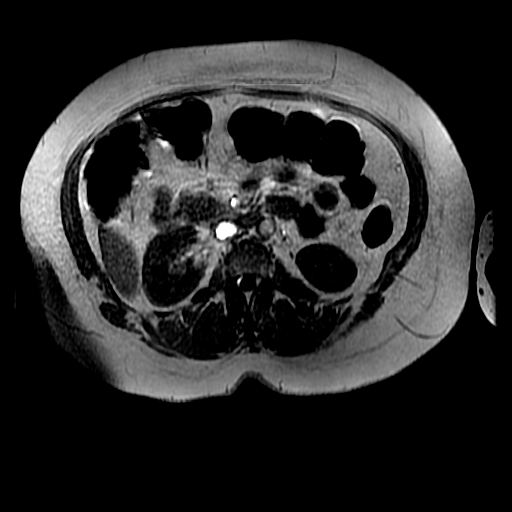
[im 42/84]
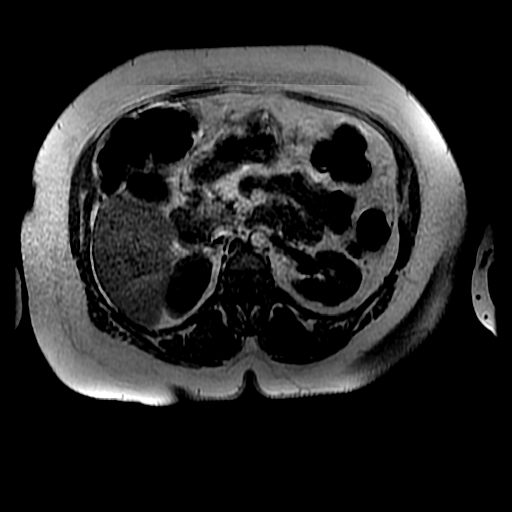
[im 52/84]
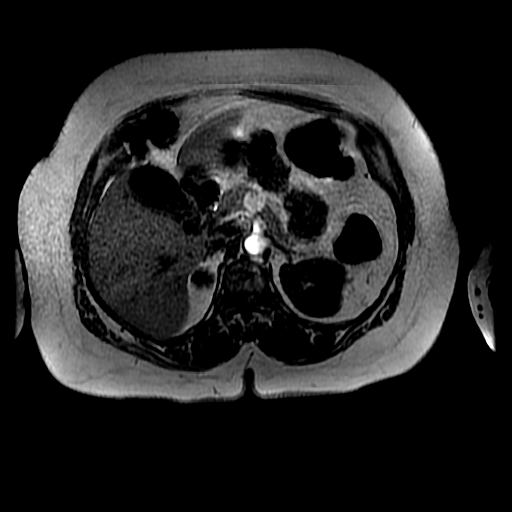
[im 63/84]
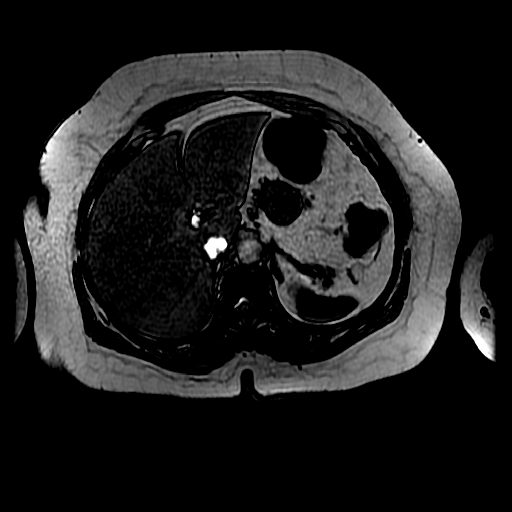
[im 73/84]
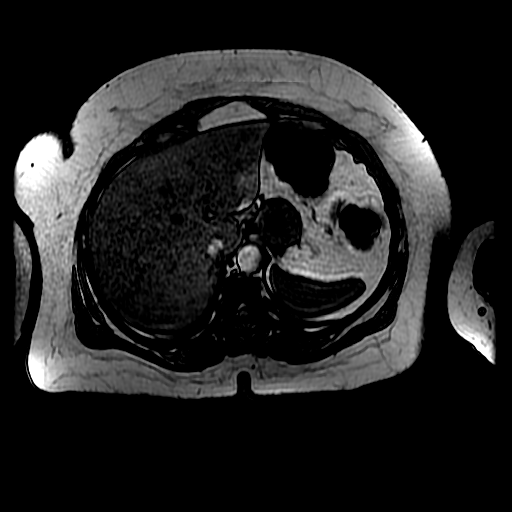
[im 84/84]
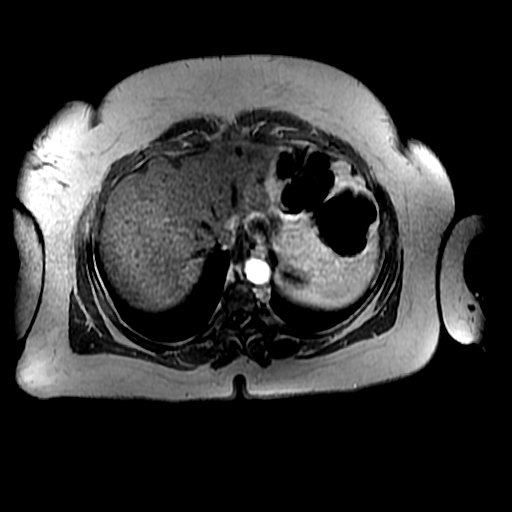

[Series 8: cor dualecho · coronal · 5.0mm · 0.94mm/px · 4 of 56 slices shown]
[im 1/56]
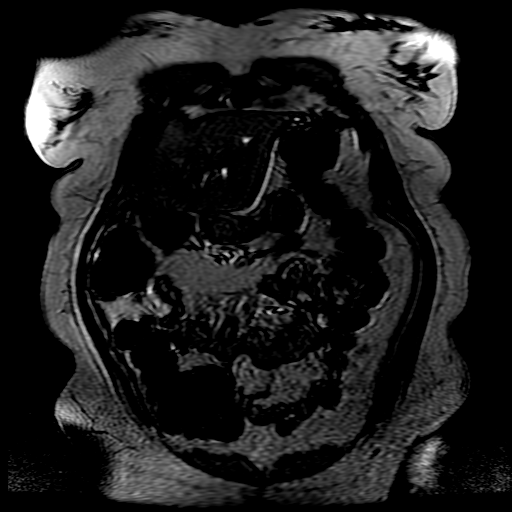
[im 12/56]
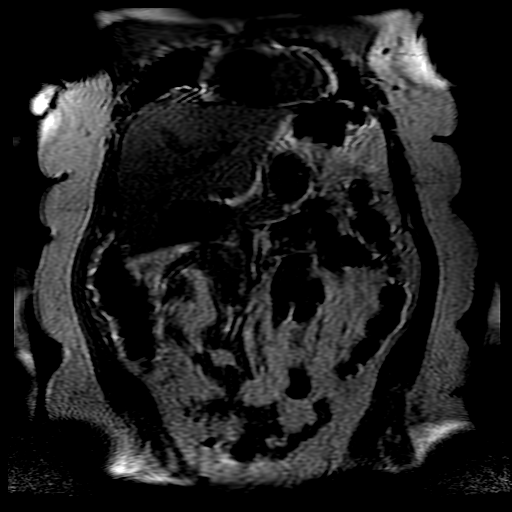
[im 34/56]
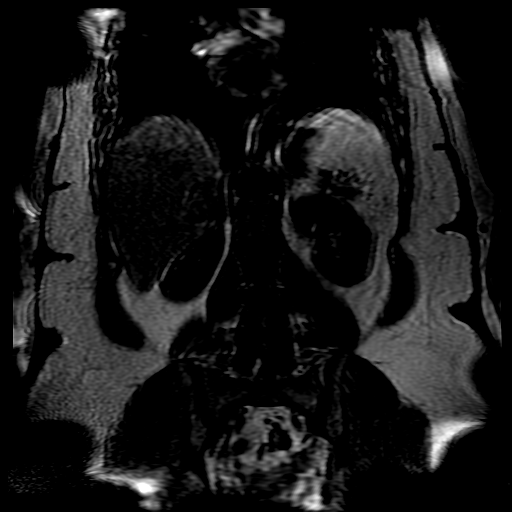
[im 56/56]
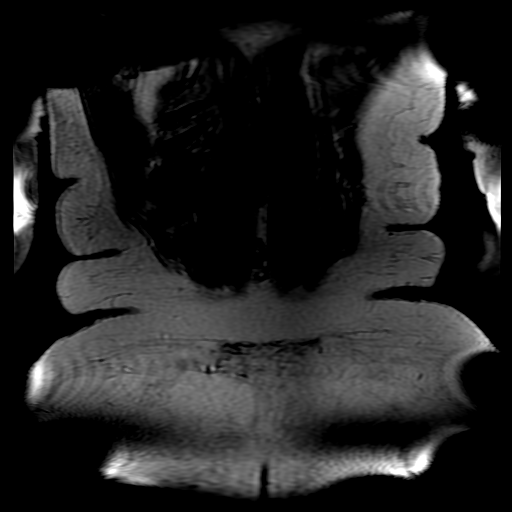

[24 of 48 positions shown; findings below may reference images not displayed]

FINDINGS: The small right adrenal gland lesion demonstrates no
signal loss on the out of phase gradient echo T1-weighted images.
T2 signal intensity similar to the kidneys.  Given the fact that
this lesion has been present since 3661 and showed fairly
significant washout of contrast on the most recent CT scan,  it is
most likely a lipid poor adenoma.

The liver is unremarkable except for a small left hepatic lobe
cyst.  The gallbladder demonstrates multiple small gallstones.  The
pancreas appears normal.  The common bile duct and pancreatic ducts
are normal in caliber and the normal course.  The kidneys are
unremarkable and the spleen is normal in size.

No mesenteric or retroperitoneal masses or adenopathy.
IMPRESSION: 1.  2 cm right adrenal gland lesion is most likely a lipid poor
adenoma.  A follow-up MR examination in 1 year is suggested to
document stability.
2.  Cholelithiasis.

## 2012-06-07 ENCOUNTER — Other Ambulatory Visit: Payer: Medicare Other | Admitting: Lab

## 2012-06-07 ENCOUNTER — Ambulatory Visit (HOSPITAL_BASED_OUTPATIENT_CLINIC_OR_DEPARTMENT_OTHER): Payer: Medicare Other | Admitting: Pharmacist

## 2012-06-07 DIAGNOSIS — I82409 Acute embolism and thrombosis of unspecified deep veins of unspecified lower extremity: Secondary | ICD-10-CM

## 2012-06-07 DIAGNOSIS — I82403 Acute embolism and thrombosis of unspecified deep veins of lower extremity, bilateral: Secondary | ICD-10-CM

## 2012-06-07 LAB — PROTIME-INR
INR: 4.1 — ABNORMAL HIGH (ref 2.00–3.50)
Protime: 49.2 Seconds — ABNORMAL HIGH (ref 10.6–13.4)

## 2012-06-07 LAB — POCT INR: INR: 4.1

## 2012-06-10 NOTE — Progress Notes (Addendum)
INR above [place] today. No problems to report regarding anticoagulation. Pt has had unstable INR's since her surgery in October 2013. Hold coumadin. Decrease Coumadin to 9mg  daily beginning on 3/25. Pt stated that she usually holds her coumadin for a couple days prior to her dental cleaning on 06/11/12. She tends to have a lot of gingival bleeding with her cleanings. She will hold her coumadin until the evening of 06/11/12 Recheck INR on 06/18/12: lab at 1:30pm and coumadin clinic at 1:45pm.  Samples provided: Coumadin 4mg  tablets ( x 10 tablets provided).                                Lot: [id number], Exp: 06/2013

## 2012-06-10 NOTE — Patient Instructions (Addendum)
Dental cleaning on 06/11/12. Hold coumadin until 06/11/12 as instructed by your dentist. Decrease Coumadin to 9mg  daily beginning on 06/11/12. Recheck INR on 06/18/12: lab at 1:30pm and coumadin clinic at 1:45pm.

## 2012-06-18 ENCOUNTER — Ambulatory Visit (HOSPITAL_BASED_OUTPATIENT_CLINIC_OR_DEPARTMENT_OTHER): Payer: Medicaid Other | Admitting: Pharmacist

## 2012-06-18 ENCOUNTER — Other Ambulatory Visit (HOSPITAL_BASED_OUTPATIENT_CLINIC_OR_DEPARTMENT_OTHER): Payer: Medicare Other | Admitting: Lab

## 2012-06-18 DIAGNOSIS — I82403 Acute embolism and thrombosis of unspecified deep veins of lower extremity, bilateral: Secondary | ICD-10-CM

## 2012-06-18 DIAGNOSIS — I82409 Acute embolism and thrombosis of unspecified deep veins of unspecified lower extremity: Secondary | ICD-10-CM

## 2012-06-18 LAB — PROTIME-INR
INR: 3.1 (ref 2.00–3.50)
Protime: 37.2 Seconds — ABNORMAL HIGH (ref 10.6–13.4)

## 2012-06-18 LAB — POCT INR: INR: 3.1

## 2012-06-18 NOTE — Progress Notes (Signed)
INR = 3.1 on Coumadin 9 mg daily (she takes 4 mg x 1 & 5 mg x 1) Pt has no complaints today re: anticoag. She has followed our dosing instructions precisely.  No missed doses. INR at [facility].  No change to dose. Return in ~2 weeks to ensure this dose is appropriate & INR remains at [place]. I provided pt w/ #20 tabs of 4 mg Coumadin samples today. Ebony Hail, Pharm.D., CPP 06/18/2012@1 :28 PM

## 2012-06-26 IMAGING — US US ABDOMEN COMPLETE
1 series · 13 of 25 positions shown · non-contrast
Comparison: [DATE] are the abdomen.

CLINICAL DATA: Possible gallstones.  Right adrenal nodule
previously evaluated by MR of the abdomen.

COMPLETE ABDOMINAL ULTRASOUND

[Series 1: us abdomen complete · 0.27mm/px · 13 of 100 slices shown]
[im 1/100]
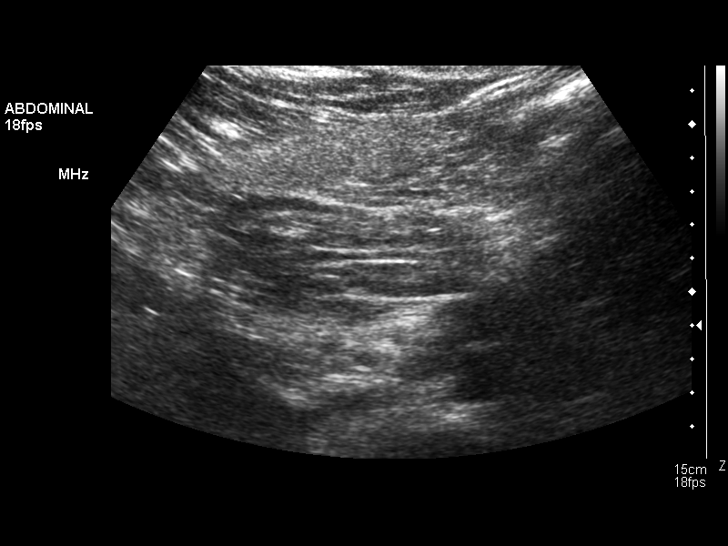
[im 9/100]
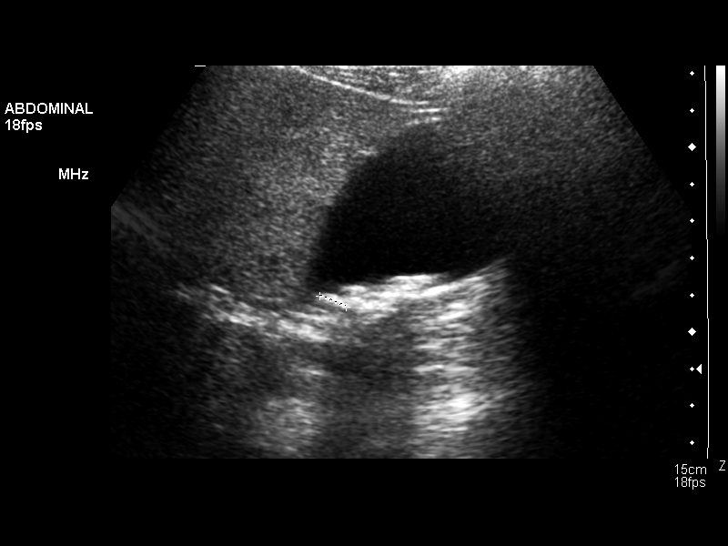
[im 17/100]
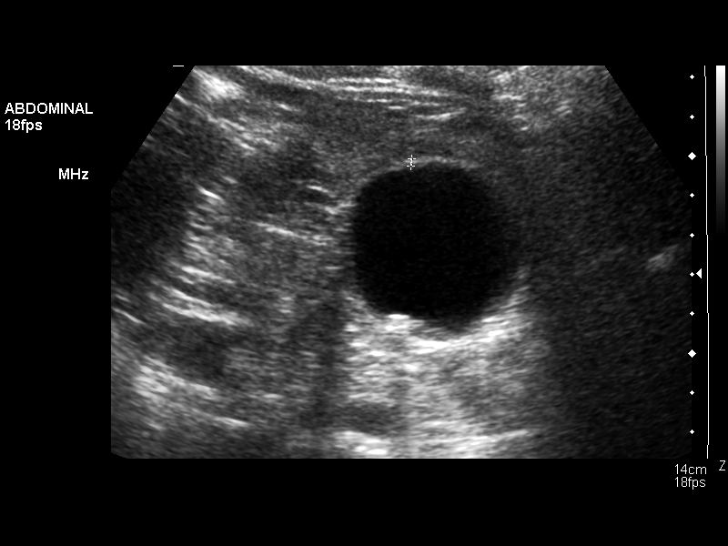
[im 25/100]
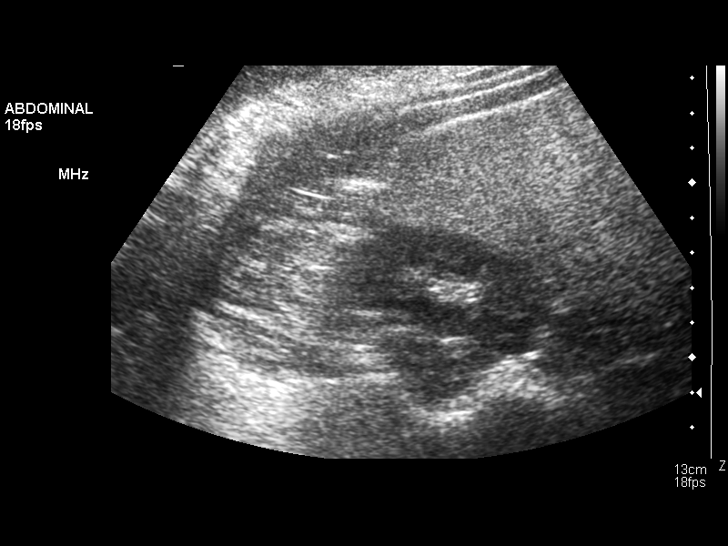
[im 34/100]
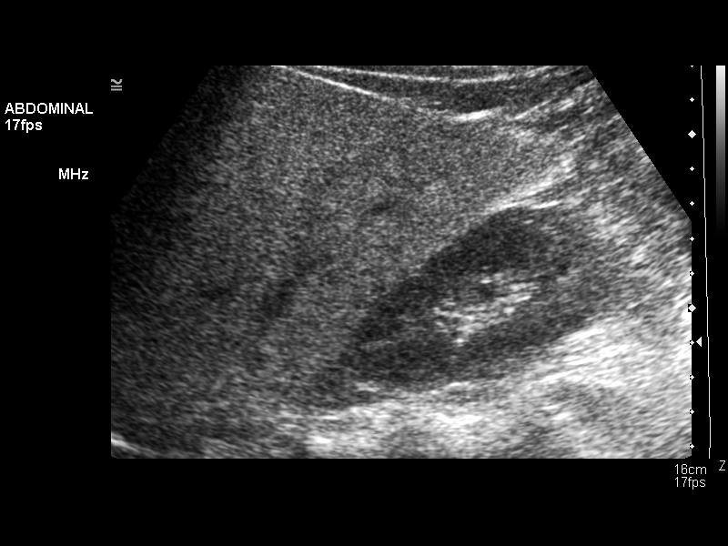
[im 42/100]
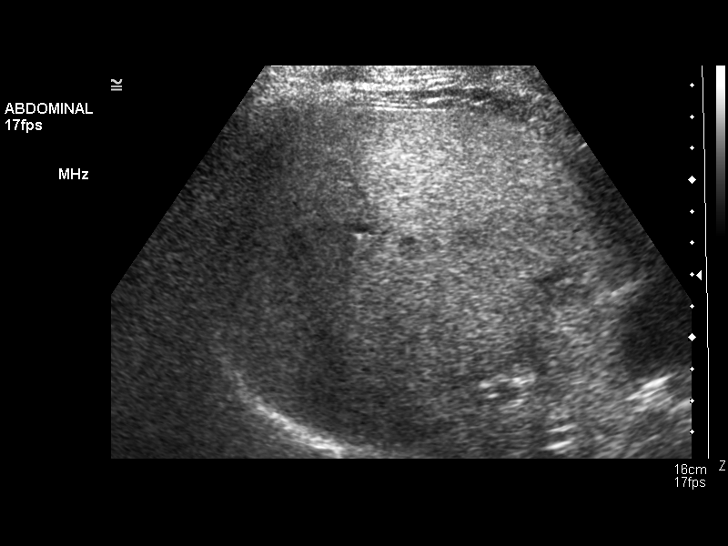
[im 50/100]
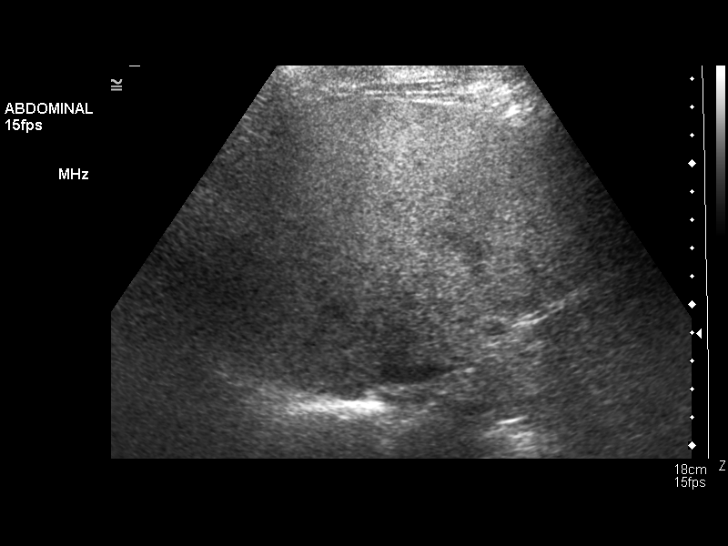
[im 58/100]
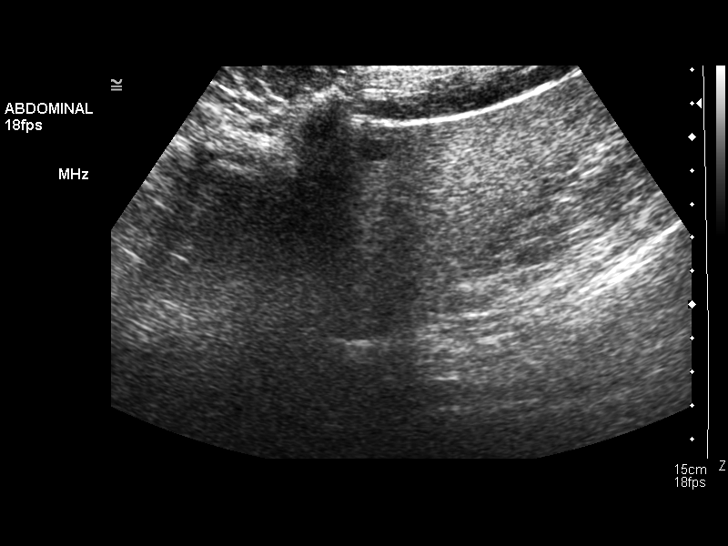
[im 67/100]
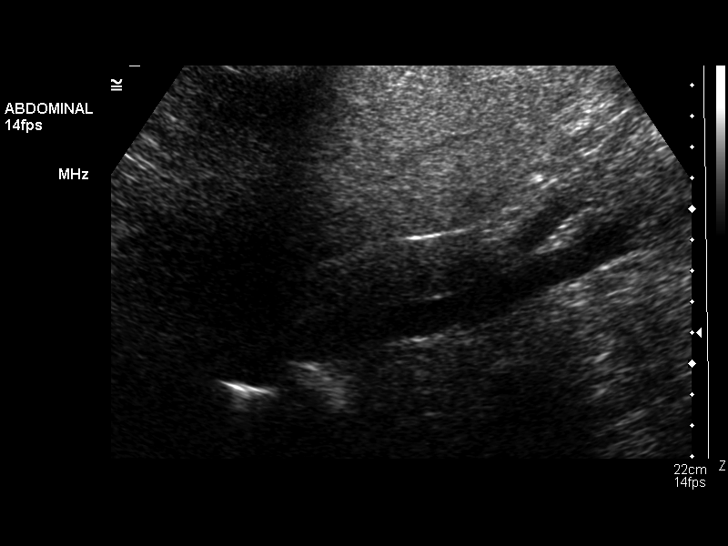
[im 75/100]
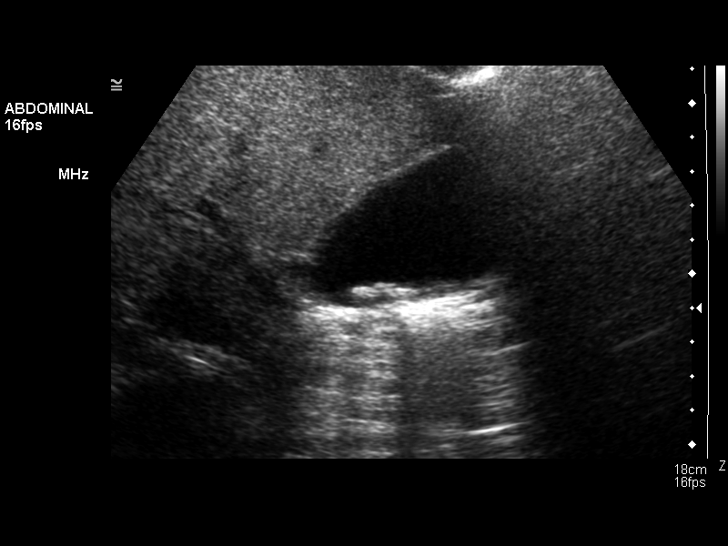
[im 83/100]
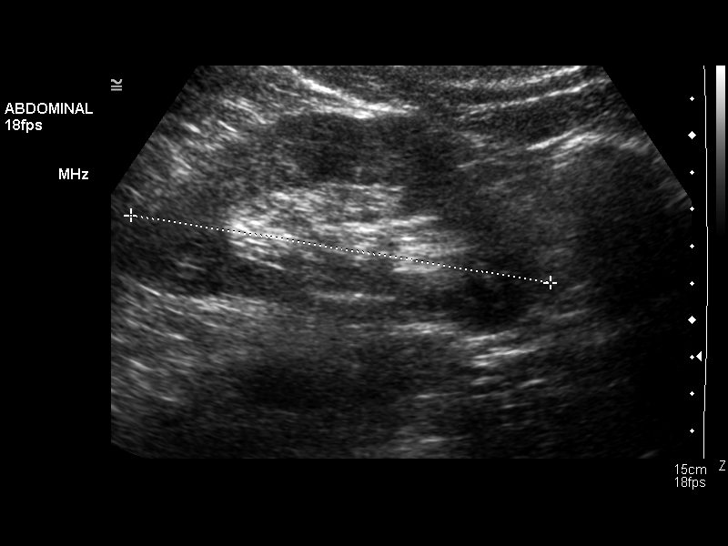
[im 91/100]
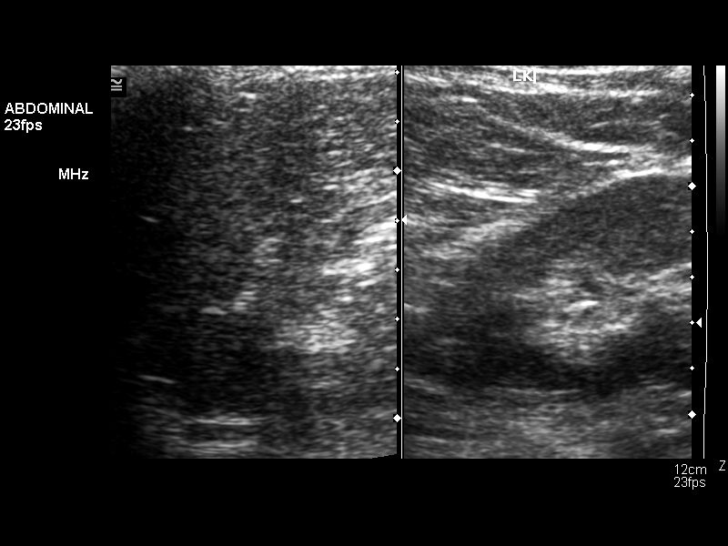
[im 100/100]
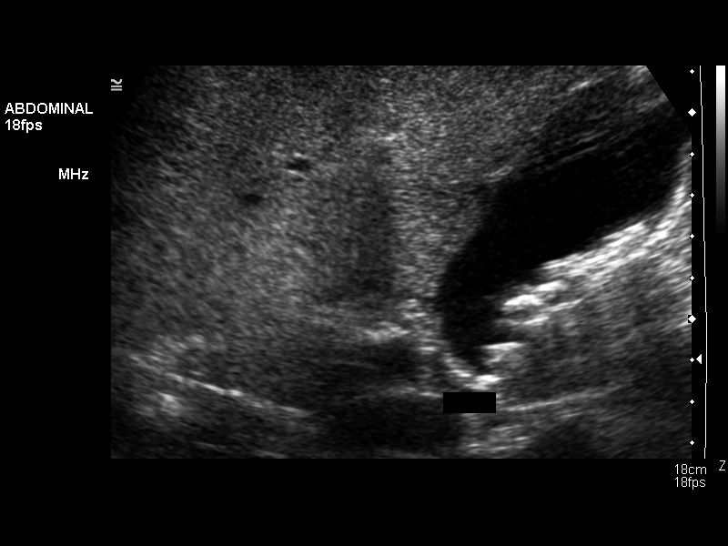

[13 of 25 positions shown; findings below may reference images not displayed]

FINDINGS: Gallbladder:  There are multiple, mobile gallstones present within
the gallbladder.  The gallbladder wall thickness is normal.  There
was no tenderness to transducer palpation of the gallbladder.

Common bile duct:  The common bile duct is normal in caliber
measuring 2 mm in diameter.

Liver:  There is a 9 mm simple appearing cyst located anteriorly
within the left lobe of the liver.  The liver is diffusely
echodense consistent with fatty change.

IVC:  Appears normal.

Pancreas:  The pancreas is not well visualized in this patient due
to body habitus and overlying gas.

Spleen:  Spleen is normal in size and contour.  There are no focal
abnormalities.

Right Kidney:  The right kidney measures 11.4 cm in length.  There
is no hydronephrosis and there are no focal abnormalities.

Left Kidney:  The left kidney measures 12.4 cm in length.  There
are no focal abnormalities and there is no hydronephrosis.

Abdominal aorta:  The abdominal aorta has a normal appearance.

There is a 3.1 x 2.4 x 2.3 cm in size solid nodule associated with
the right adrenal gland.  This has been previously evaluated by CT
and MRI of the abdomen.The measurements reported on the prior
studies were obtained in the axial plane.  When measuring the
adrenal nodule on the sagittal re-formations on the 06/16/2009 CT
of the abdomen the nodule measured 3.0 cm in greatest dimension on
the sagittal image (not significantly change.  This  was felt on MR
of the abdomen to most likely represent a lipid poor adenoma.
Follow-up MR in 1 year (November 2010) was recommended.
IMPRESSION: 1.  Cholelithiasis.
2.  Again noted is the right adrenal nodule previously evaluated by
CT of the abdomen and MRI of the abdomen.  This is more accurately
evaluated by CT and MRI of the abdomen.  This was felt on MR of the
abdomen to most likely represent a lipid poor adenoma.  As noted
previously, recommend follow-up MRI of the abdomen in 1 year.

## 2012-07-05 ENCOUNTER — Other Ambulatory Visit (HOSPITAL_BASED_OUTPATIENT_CLINIC_OR_DEPARTMENT_OTHER): Payer: Medicare Other | Admitting: Lab

## 2012-07-05 ENCOUNTER — Ambulatory Visit (HOSPITAL_BASED_OUTPATIENT_CLINIC_OR_DEPARTMENT_OTHER): Payer: Medicaid Other | Admitting: Pharmacist

## 2012-07-05 DIAGNOSIS — Z7901 Long term (current) use of anticoagulants: Secondary | ICD-10-CM

## 2012-07-05 DIAGNOSIS — I82409 Acute embolism and thrombosis of unspecified deep veins of unspecified lower extremity: Secondary | ICD-10-CM

## 2012-07-05 DIAGNOSIS — I82403 Acute embolism and thrombosis of unspecified deep veins of lower extremity, bilateral: Secondary | ICD-10-CM

## 2012-07-05 LAB — PROTIME-INR
INR: 3.1 (ref 2.00–3.50)
Protime: 37.2 Seconds — ABNORMAL HIGH (ref 10.6–13.4)

## 2012-07-05 LAB — POCT INR: INR: 3.1

## 2012-07-05 NOTE — Progress Notes (Signed)
INR at [facility] today (on the upper end at 3.1). Pt reports no missed doses or bleeding/bruising. No other complaints noted. Plan to continue same dose of 9 mg daily and return in 2 weeks for INR check and coumadin clinic on Friday 07/19/12 at 1:30pm

## 2012-07-05 NOTE — Patient Instructions (Addendum)
INR 3.1 today  No changes  Continue 9 mg once daily  Return in 2 weeks on 07/19/12 at 1:30pm

## 2012-07-19 ENCOUNTER — Ambulatory Visit (HOSPITAL_BASED_OUTPATIENT_CLINIC_OR_DEPARTMENT_OTHER): Payer: Medicare Other | Admitting: Pharmacist

## 2012-07-19 ENCOUNTER — Other Ambulatory Visit (HOSPITAL_BASED_OUTPATIENT_CLINIC_OR_DEPARTMENT_OTHER): Payer: Medicare Other | Admitting: Lab

## 2012-07-19 DIAGNOSIS — I82409 Acute embolism and thrombosis of unspecified deep veins of unspecified lower extremity: Secondary | ICD-10-CM

## 2012-07-19 DIAGNOSIS — I82403 Acute embolism and thrombosis of unspecified deep veins of lower extremity, bilateral: Secondary | ICD-10-CM

## 2012-07-19 LAB — PROTIME-INR
INR: 2.5 (ref 2.00–3.50)
Protime: 30 Seconds — ABNORMAL HIGH (ref 10.6–13.4)

## 2012-07-19 LAB — POCT INR: INR: 2.5

## 2012-07-19 NOTE — Patient Instructions (Signed)
Continue Coumadin 9mg  daily Recheck INR on 09/05/12; Lab at 8:30am, coumadin clinic at 8:45am and MD at 9:00am

## 2012-07-19 NOTE — Progress Notes (Addendum)
INR 2.5 on 9 mg daily Continue same dose No changes to report in meds or diet Will see back in clinic in 6 weeks when she has a f/u with MD  Gave pt #40 4mg  coumadin LOT: [id number]  Exp: 4/15

## 2012-08-22 IMAGING — MG MM DIGITAL SCREENING
4 series · 4 of 4 positions shown · non-contrast
Comparison: none

DG SCREEN MAMMOGRAM BILATERAL
Bilateral CC and MLO view(s) were taken.

DIGITAL SCREENING MAMMOGRAM WITH CAD:
There are scattered fibroglandular densities.  No masses or malignant type calcifications are 
identified.  Compared with prior studies.
Images were processed with CAD.

[R CC]
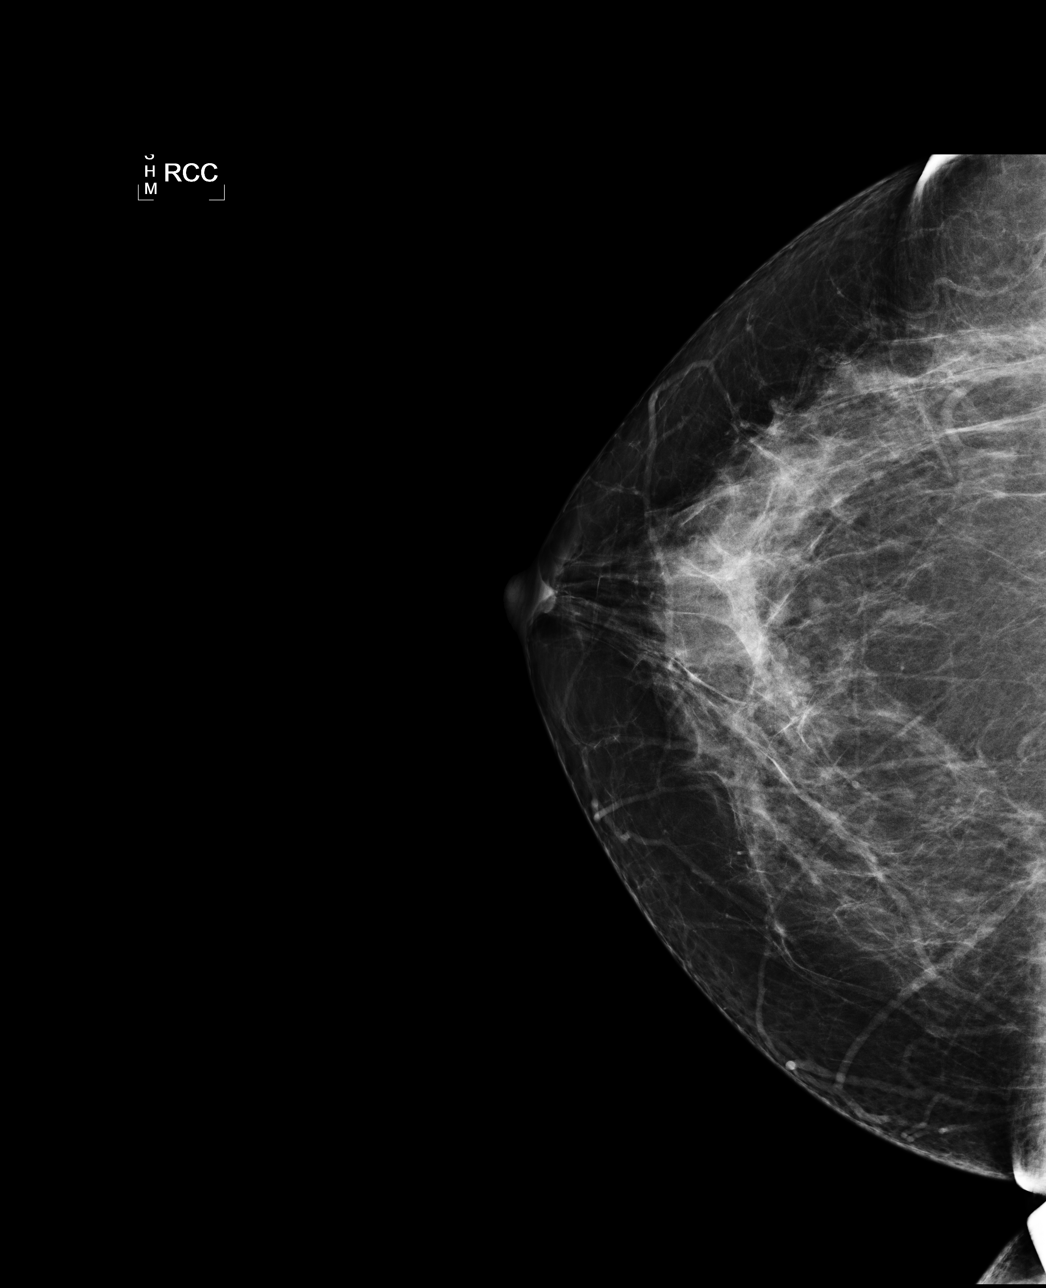

[L CC]
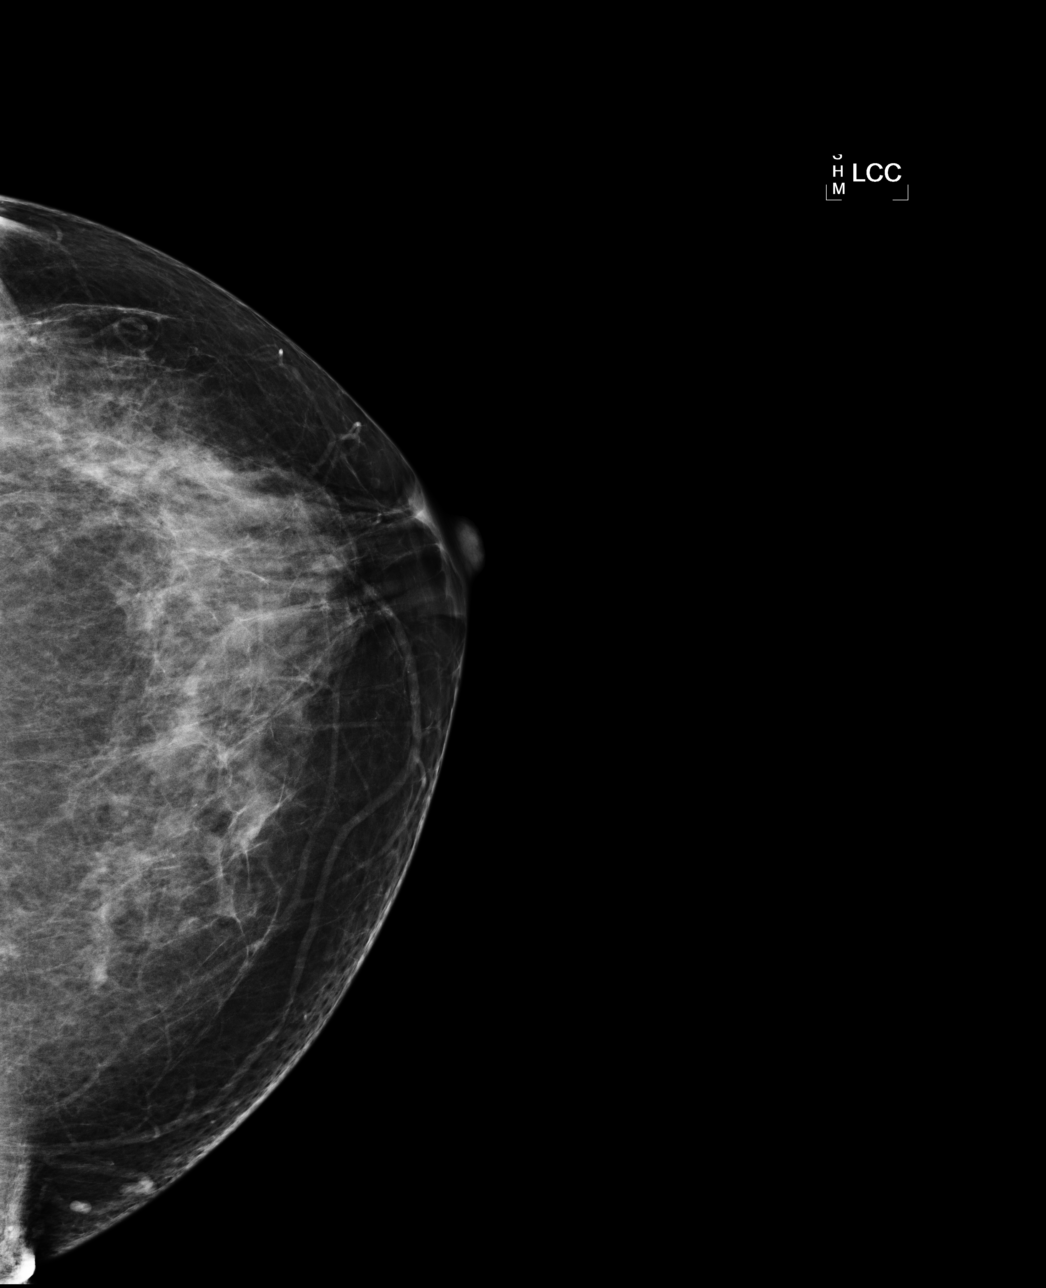

[L MLO]
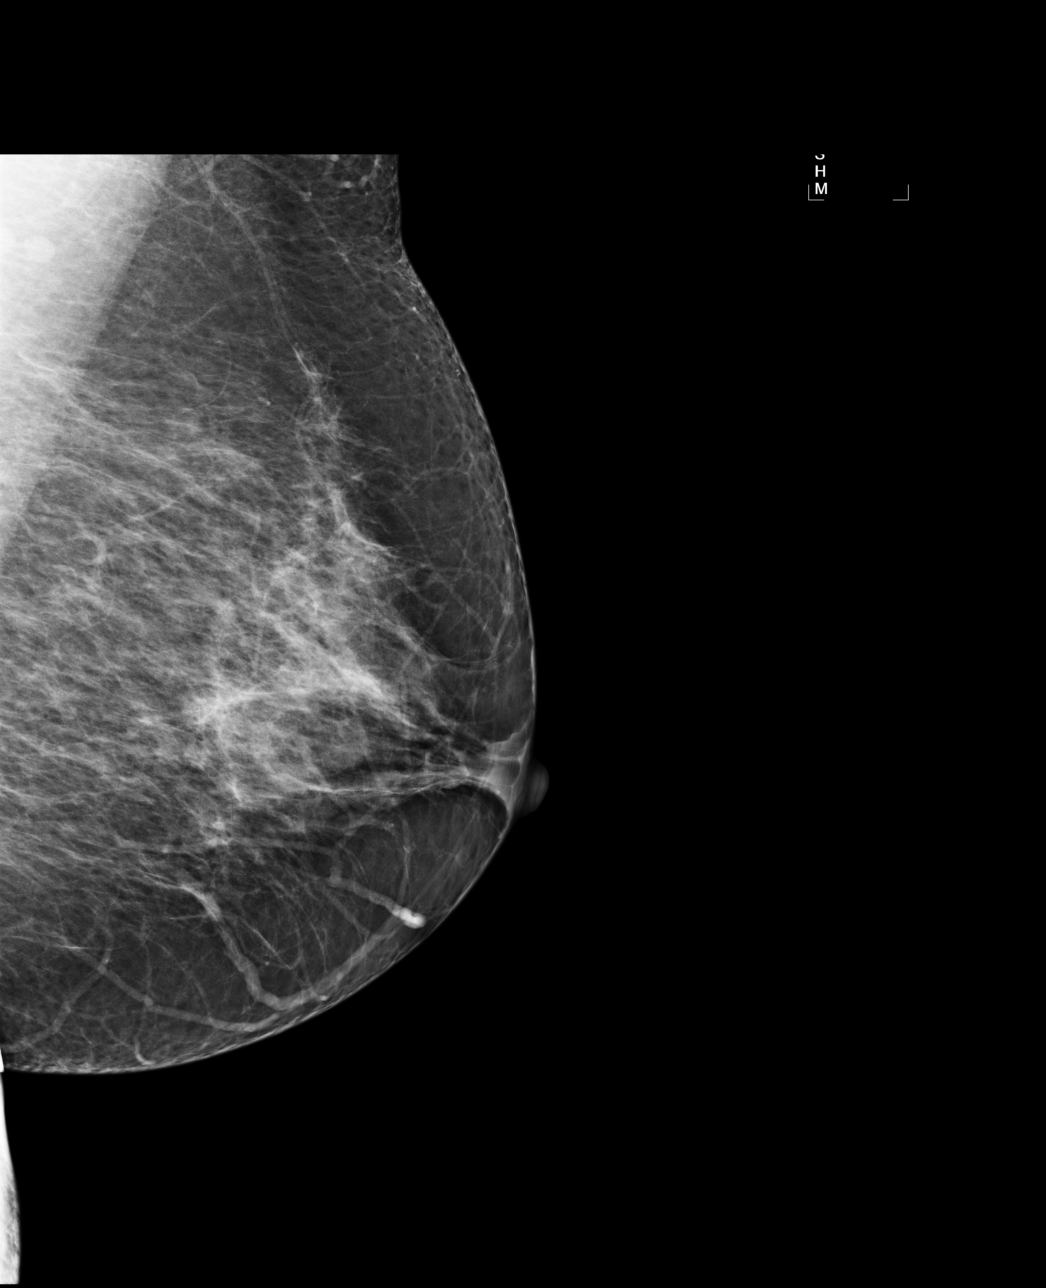

[R MLO]
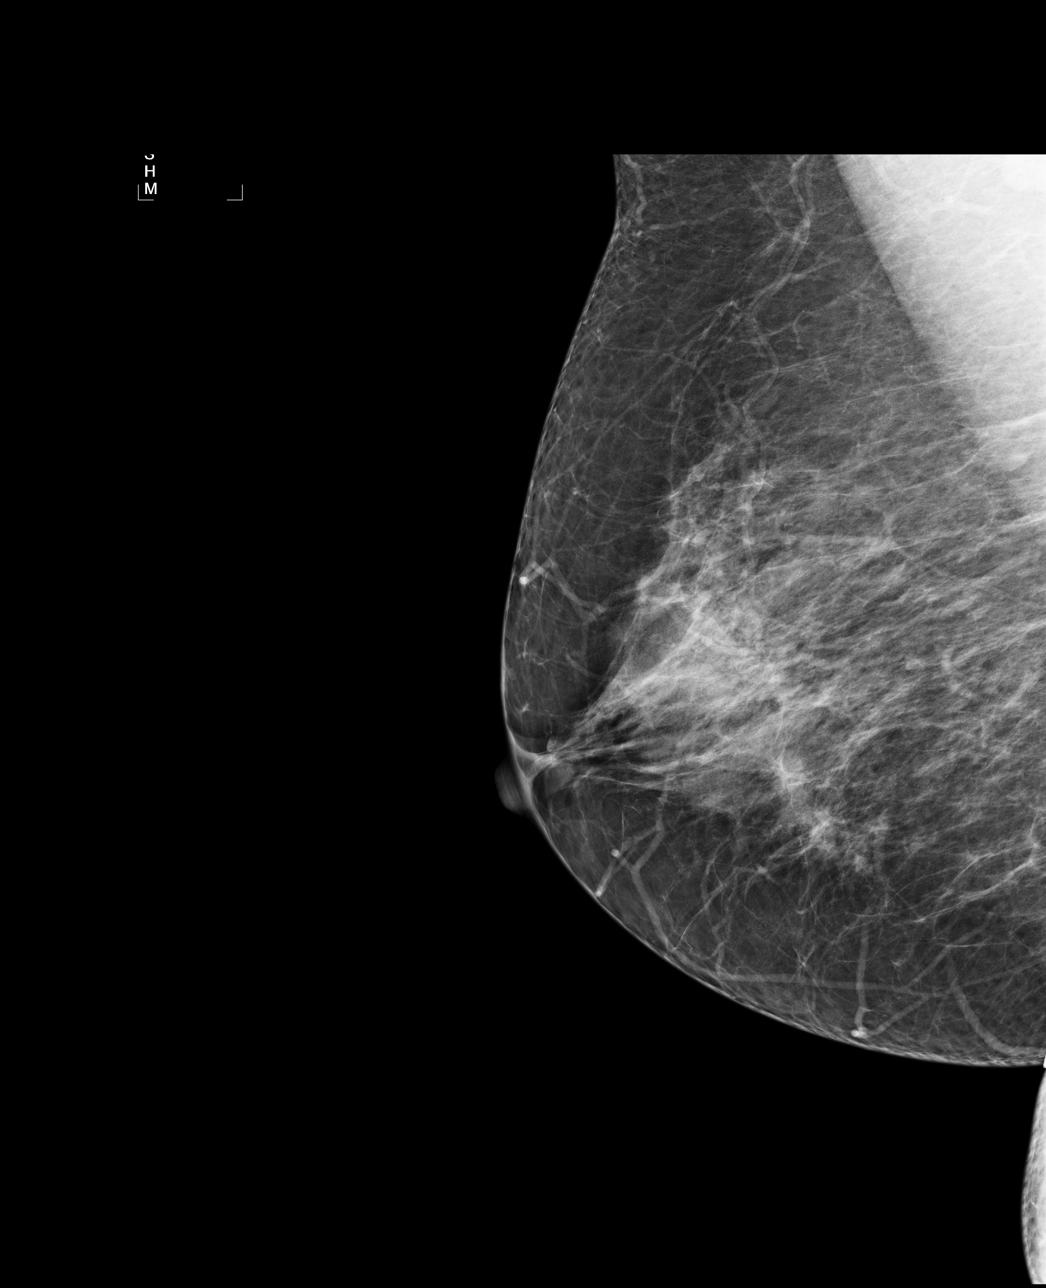

[4 of 4 positions shown; findings below may reference images not displayed]

IMPRESSION: No specific mammographic evidence of malignancy.  Next screening mammogram is recommended in one 
year.

A result letter of this screening mammogram will be mailed directly to the patient.

ASSESSMENT: Negative - BI-RADS 1

Screening mammogram in 1 year.
,

## 2012-09-05 ENCOUNTER — Ambulatory Visit (HOSPITAL_BASED_OUTPATIENT_CLINIC_OR_DEPARTMENT_OTHER): Payer: Medicare Other | Admitting: Oncology

## 2012-09-05 ENCOUNTER — Other Ambulatory Visit (HOSPITAL_BASED_OUTPATIENT_CLINIC_OR_DEPARTMENT_OTHER): Payer: Medicare Other | Admitting: Lab

## 2012-09-05 ENCOUNTER — Telehealth: Payer: Self-pay | Admitting: Oncology

## 2012-09-05 ENCOUNTER — Ambulatory Visit: Payer: Medicare Other | Admitting: Pharmacist

## 2012-09-05 VITALS — BP 160/102 | HR 81 | Temp 98.0°F | Resp 18 | Ht 67.0 in | Wt 276.6 lb

## 2012-09-05 DIAGNOSIS — I82403 Acute embolism and thrombosis of unspecified deep veins of lower extremity, bilateral: Secondary | ICD-10-CM

## 2012-09-05 DIAGNOSIS — I82409 Acute embolism and thrombosis of unspecified deep veins of unspecified lower extremity: Secondary | ICD-10-CM

## 2012-09-05 DIAGNOSIS — D649 Anemia, unspecified: Secondary | ICD-10-CM

## 2012-09-05 LAB — PROTIME-INR
INR: 2.6 (ref 2.00–3.50)
Protime: 31.2 Seconds — ABNORMAL HIGH (ref 10.6–13.4)

## 2012-09-05 LAB — POCT INR: INR: 2.6

## 2012-09-05 NOTE — Progress Notes (Signed)
   [facility]    OFFICE PROGRESS NOTE   INTERVAL HISTORY:   She returns as scheduled. She continues Coumadin anticoagulation. She is followed in the Coumadin clinic. [name] underwent an anterior lumbar fusion procedure in October of 2013. She denies symptoms of recurrent venous thrombosis. No bleeding. She has intermittent swelling in the left lower leg.  Objective:  Vital signs in last 24 hours:  Blood pressure 160/102, pulse 81, temperature 98 F (36.7 C), temperature source Oral, resp. rate 18, height 5\' 7"  (1.702 m), weight 276 lb 9.6 oz (125.465 kg).   Resp: Lungs clear bilaterally Cardio: Regular rate and rhythm GI: No hepatomegaly Vascular: The left lower leg is slightly larger than the right side, no edema   Lab Results:  PT/INR 2 point   Medications: I have reviewed the patient's current medications.  Assessment/Plan: 1. History of bilateral lower extremity deep vein thrombosis. She continues Coumadin and is followed in the [facility] Coumadin clinic.  2. History of a positive lupus anticoagulant. 3. History of a positive beta-2-glycoprotein IgA antibody. 4. Chronic bilateral leg pain - negative bilateral Doppler January 2010. 5. History of hypertension. 6. COPD. 7. Status post hysterectomy. 8. Remote history of cocaine abuse. 9. History of an adrenal lesion on a CT scan in 2008 - status post a repeat CT on June 16, 2009, showing slow enlargement of the right adrenal nodule since 2006, most consistent with an adenoma. 10. History of rectal and vaginal bleeding in May 2009.  11. Status post carpal tunnel surgery. 12. 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 Right knee replacement September 2011. 13. Multiple orthopedic conditions. 14. Placement of IVC filter preoperatively before knee replacement. The IVC filter was retrieved 03/01/2011. 15. Anemia. Ferritin in normal range at 28 on 10/16/2011. 16. Status post lumbar fusion surgery October  2013    Disposition:  [name] continues Coumadin anticoagulation. She will continue followup in the [facility] Cou[facility]. She will return for an office visit in 9 months.   Thornton Papas, MD  09/05/2012  6:57 PM

## 2012-09-05 NOTE — Progress Notes (Signed)
Pt seen today prior to her appmt with MD. INR=2.6 on 9mg  daily No changes to report. Gave patient #30 Coumadin 4mg  tabs XLK#:[id number] Exp 10/15 She will RTC in 4 weeks on 10/03/12 at 8:30

## 2012-09-05 NOTE — Patient Instructions (Addendum)
Continue Coumadin 9mg  daily Recheck INR on 10/03/12; Lab at 8:30am and coumadin clinic at 8:45am.

## 2012-09-05 NOTE — Telephone Encounter (Signed)
gv and printed appt sched and avs for pt  °

## 2012-09-16 ENCOUNTER — Other Ambulatory Visit: Payer: Self-pay

## 2012-09-16 DIAGNOSIS — Z1231 Encounter for screening mammogram for malignant neoplasm of breast: Secondary | ICD-10-CM

## 2012-09-23 ENCOUNTER — Other Ambulatory Visit: Payer: Medicare Other | Admitting: Lab

## 2012-10-03 ENCOUNTER — Ambulatory Visit: Payer: Medicare Other

## 2012-10-03 ENCOUNTER — Other Ambulatory Visit: Payer: Medicare Other | Admitting: Lab

## 2012-10-05 IMAGING — CR DG CHEST 2V
2 series · 2 of 2 positions shown · non-contrast
Comparison: 11/16/2008

CLINICAL DATA: Preoperative evaluation for symptomatic
cholelithiasis.  Hypertension, diabetes and history of COPD

CHEST - 2 VIEW

[w chest pa]
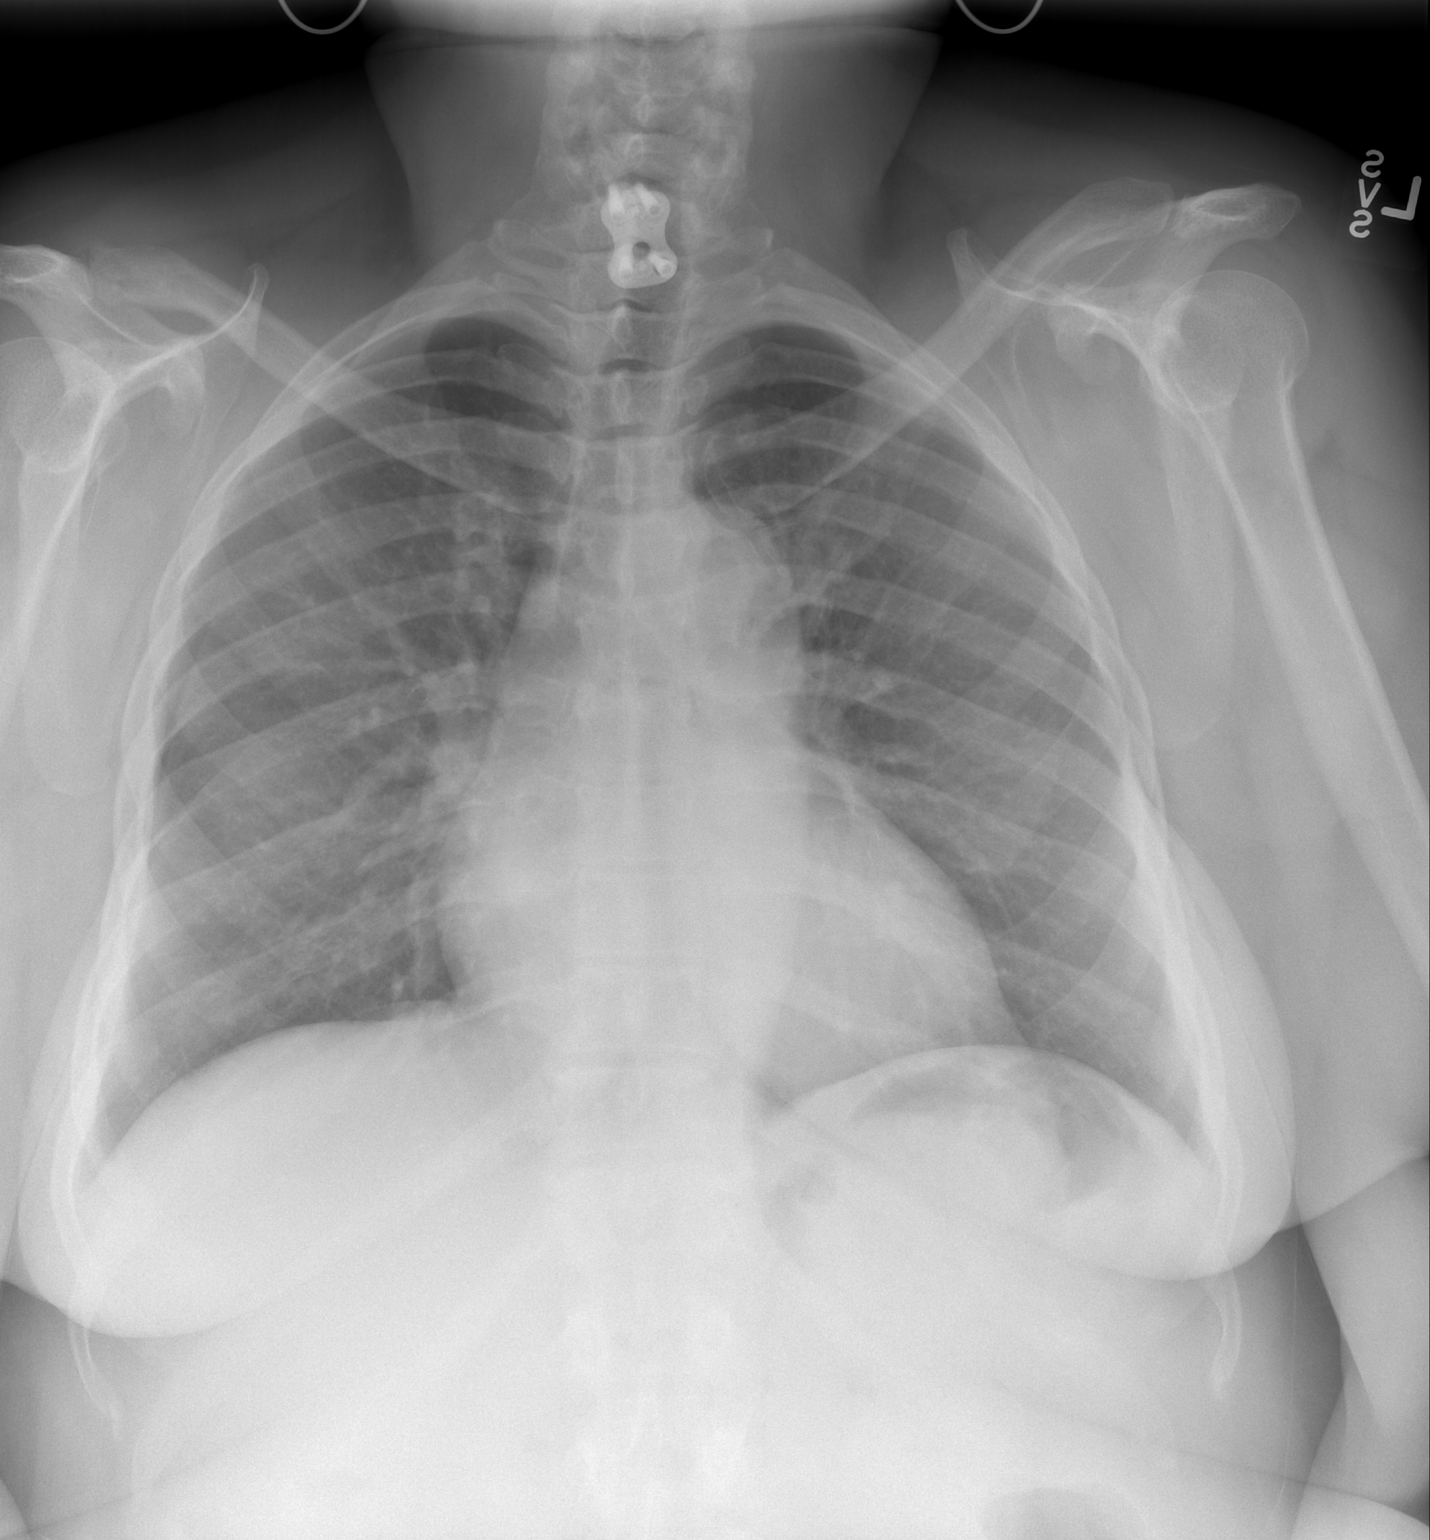

[w chest lat]
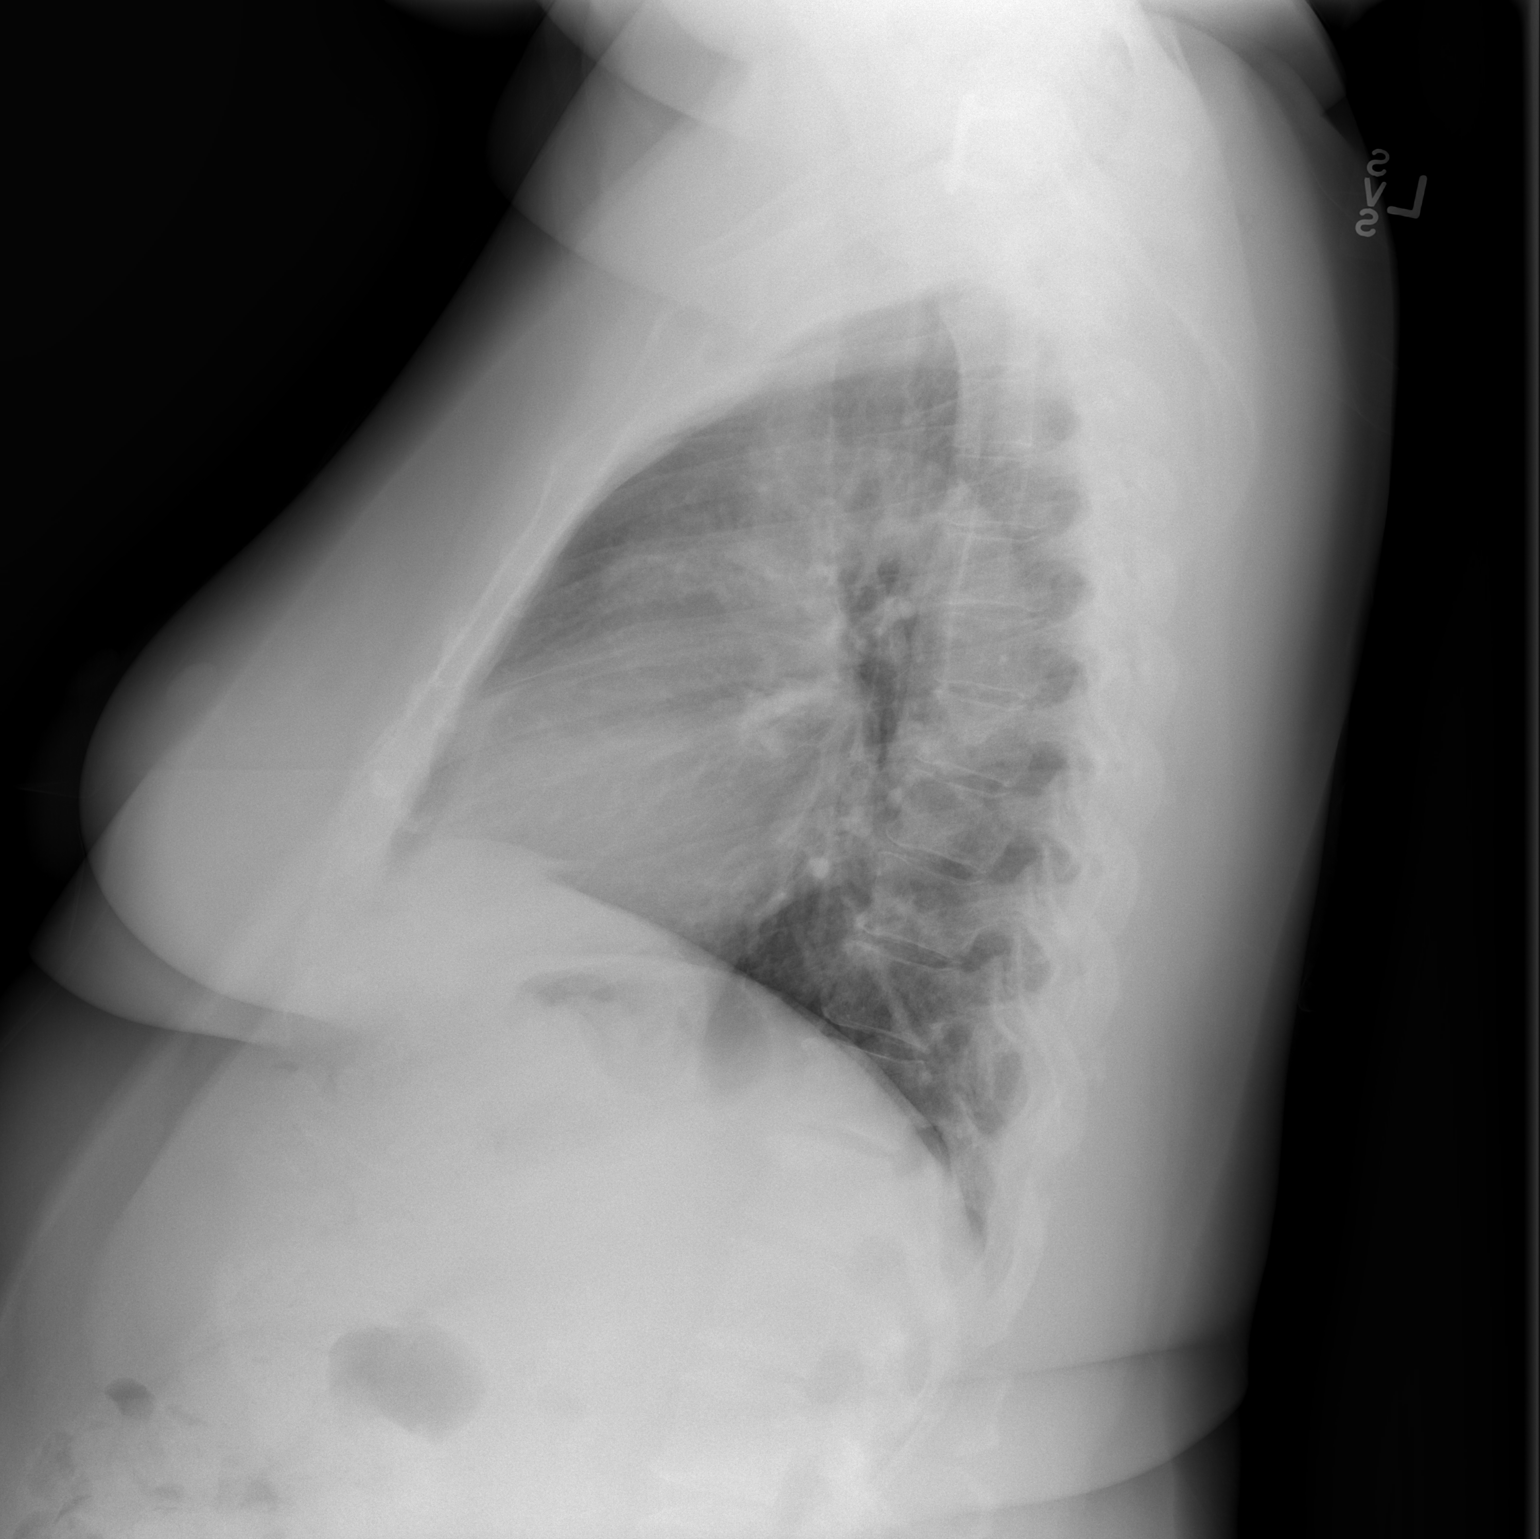

[2 of 2 positions shown; findings below may reference images not displayed]

FINDINGS: Heart size is upper limits of normal and mild ectasia of
the thoracic aorta is seen.  The lung fields are clear with no
signs of focal infiltrate or congestive failure.  No pleural fluid
or peribronchial cuffing is noted.

Bony structures appear intact.  Screw plate fixation of the lower
cervical spine is noted.
IMPRESSION: No acute or new worrisome focal abnormality noted

## 2012-10-10 ENCOUNTER — Ambulatory Visit (HOSPITAL_BASED_OUTPATIENT_CLINIC_OR_DEPARTMENT_OTHER): Payer: Medicare Other | Admitting: Pharmacist

## 2012-10-10 ENCOUNTER — Other Ambulatory Visit: Payer: Medicare Other | Admitting: Lab

## 2012-10-10 DIAGNOSIS — I82403 Acute embolism and thrombosis of unspecified deep veins of lower extremity, bilateral: Secondary | ICD-10-CM

## 2012-10-10 DIAGNOSIS — I82409 Acute embolism and thrombosis of unspecified deep veins of unspecified lower extremity: Secondary | ICD-10-CM

## 2012-10-10 LAB — PROTIME-INR
INR: 2.6 (ref 2.00–3.50)
Protime: 31.2 Seconds — ABNORMAL HIGH (ref 10.6–13.4)

## 2012-10-10 LAB — POCT INR: INR: 2.6

## 2012-10-10 NOTE — Progress Notes (Signed)
INR within [facility] today. Pt doing well. No problems/concerns with anticoagulation. No s/s of clotting. No changes in diet, medications, etc. No missed doses. Continue Coumadin 9mg  daily Recheck INR on 11/06/12; Lab at 8:30am and coumadin clinic at 8:45am.  Coumadin 4mg  tablets provided: (x 40 tablets). Lot: [id number], Exp: 10/15

## 2012-10-10 NOTE — Patient Instructions (Signed)
Continue Coumadin 9mg  daily Recheck INR on 11/06/12; Lab at 8:30am and coumadin clinic at 8:45am.

## 2012-10-11 ENCOUNTER — Ambulatory Visit: Payer: Medicare Other

## 2012-10-15 ENCOUNTER — Telehealth: Payer: Self-pay | Admitting: *Deleted

## 2012-10-15 NOTE — Telephone Encounter (Signed)
Called patient to determine last colonoscopy date. She thinks it was 3-4 years ago per Dr. Charna Elizabeth. Faxed request to office for copy of report and path.

## 2012-11-05 IMAGING — CR DG NECK SOFT TISSUE
2 series · 2 of 2 positions shown · non-contrast
Comparison: None.

CLINICAL DATA: Swelling in the throat.  Sensation of foreign body
in the throat.

NECK SOFT TISSUES - 1+ VIEW

[w soft tissue neck]
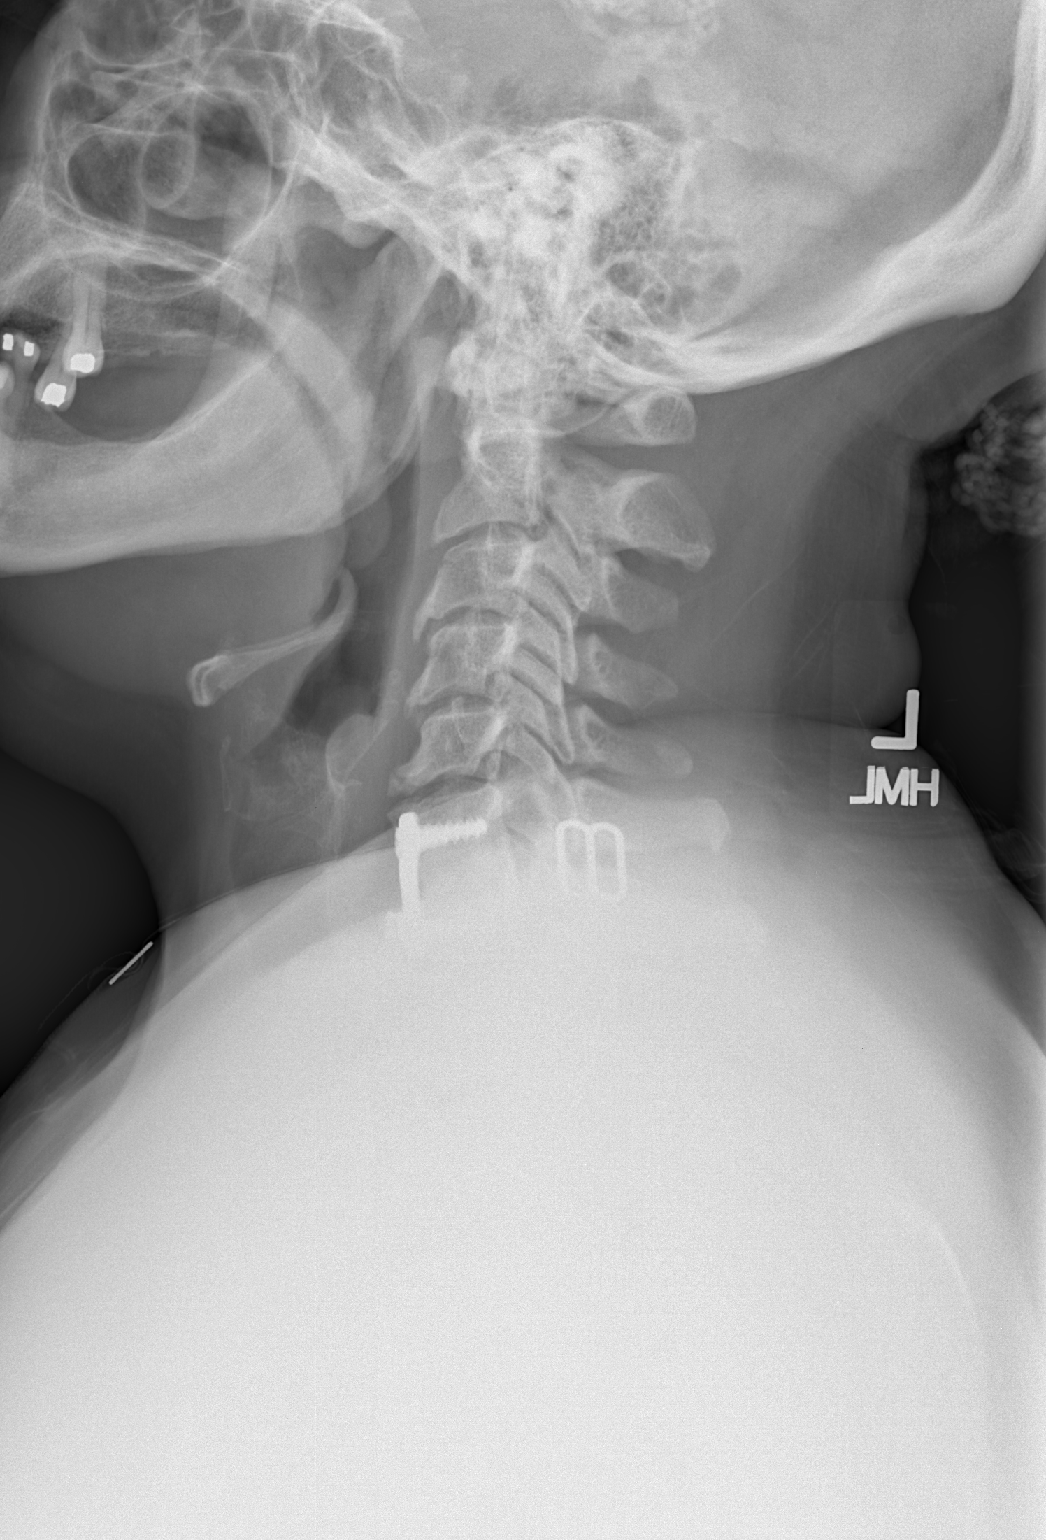

[w soft tissue neck ap]
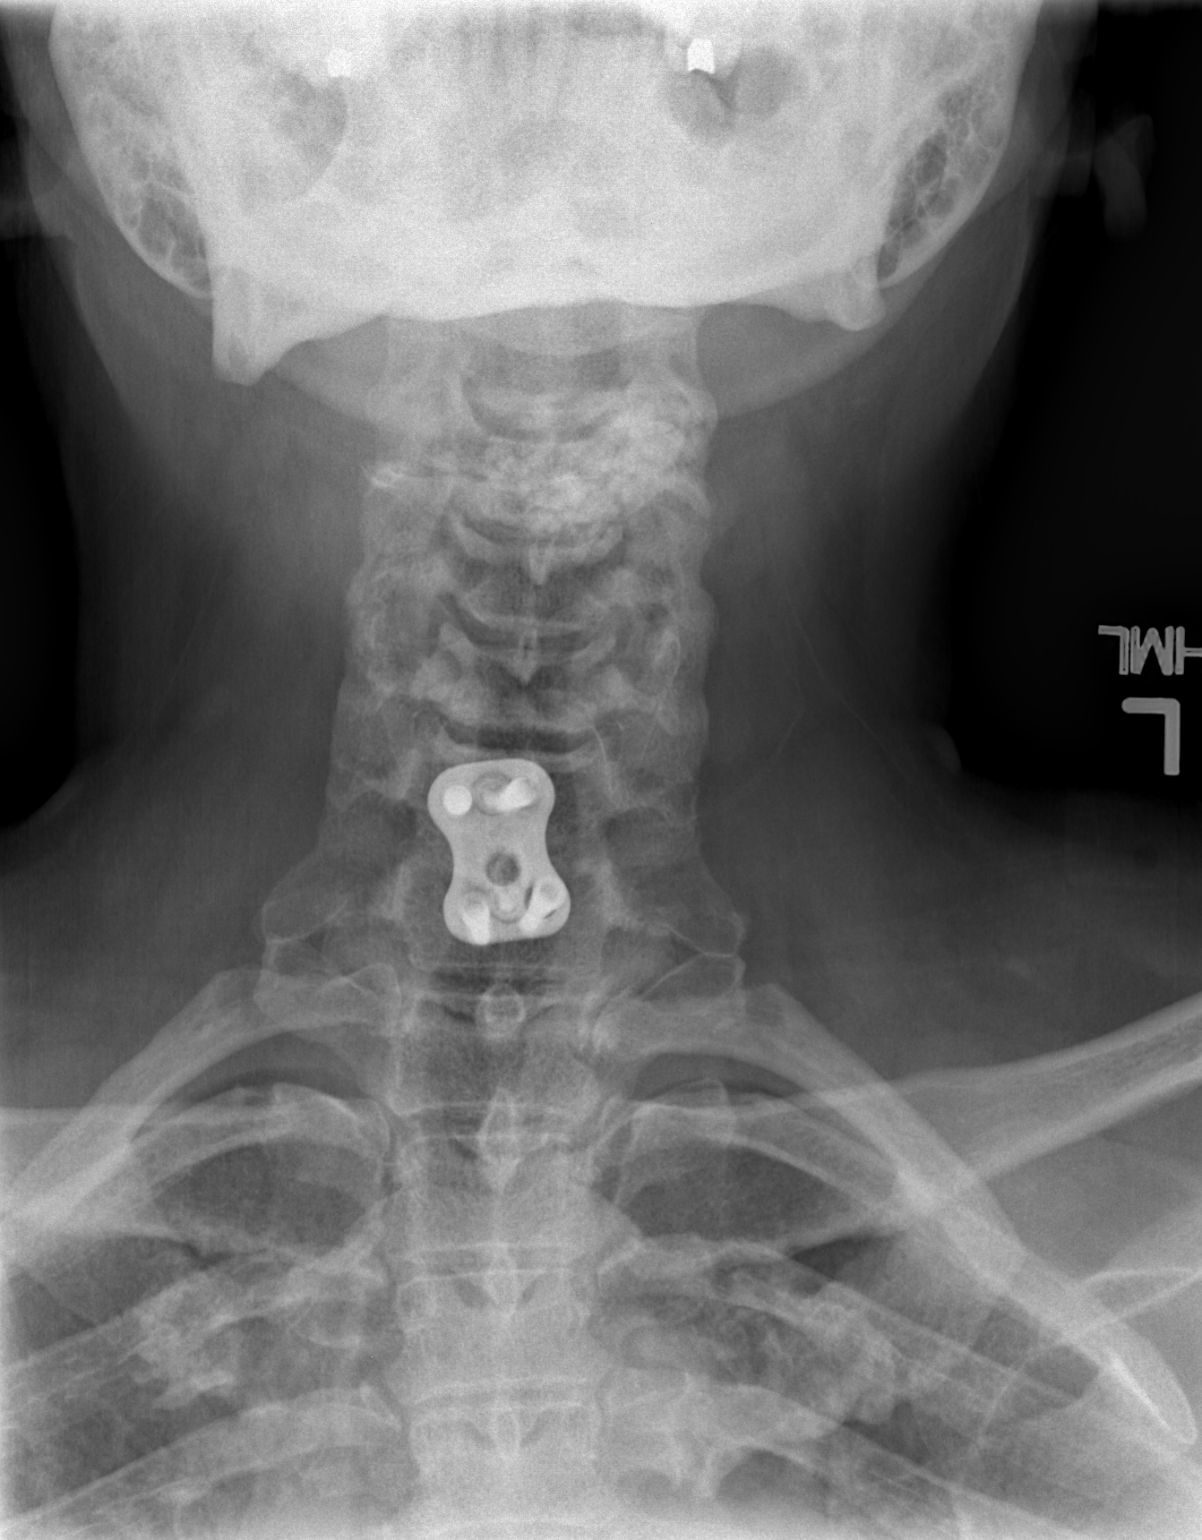

[2 of 2 positions shown; findings below may reference images not displayed]

FINDINGS: There is no prevertebral soft tissue swelling.  The
epiglottis appears normal.  Base of the tongue and uvula appear
normal.

The patient has had anterior cervical fusion at C6-7.  One of the
screws is slightly protruding from the plate at C7.

There is slight anterior spurring at C3-4, C4-5, and C5-6 but these
are usually not symptomatic.
IMPRESSION: The soft tissues appear normal.

The left screw of the plate at C7 protrudes several millimeters
from the plate.

## 2012-11-06 ENCOUNTER — Other Ambulatory Visit: Payer: Medicare Other | Admitting: Lab

## 2012-11-06 ENCOUNTER — Telehealth: Payer: Self-pay | Admitting: Pharmacist

## 2012-11-06 ENCOUNTER — Ambulatory Visit: Payer: Medicare Other

## 2012-11-06 NOTE — Telephone Encounter (Signed)
Called and Left VM for patient to call back to reschedule coumadin clinic and lab appointment. Patient missed today's appointment.   Chris Malik Paar, PharmD  

## 2012-11-12 ENCOUNTER — Ambulatory Visit (HOSPITAL_BASED_OUTPATIENT_CLINIC_OR_DEPARTMENT_OTHER): Payer: Medicare Other | Admitting: Pharmacist

## 2012-11-12 ENCOUNTER — Other Ambulatory Visit (HOSPITAL_BASED_OUTPATIENT_CLINIC_OR_DEPARTMENT_OTHER): Payer: Medicare Other | Admitting: Lab

## 2012-11-12 DIAGNOSIS — I82409 Acute embolism and thrombosis of unspecified deep veins of unspecified lower extremity: Secondary | ICD-10-CM

## 2012-11-12 DIAGNOSIS — I82403 Acute embolism and thrombosis of unspecified deep veins of lower extremity, bilateral: Secondary | ICD-10-CM

## 2012-11-12 DIAGNOSIS — Z5181 Encounter for therapeutic drug level monitoring: Secondary | ICD-10-CM

## 2012-11-12 DIAGNOSIS — Z86718 Personal history of other venous thrombosis and embolism: Secondary | ICD-10-CM

## 2012-11-12 DIAGNOSIS — Z7901 Long term (current) use of anticoagulants: Secondary | ICD-10-CM

## 2012-11-12 LAB — PROTIME-INR
INR: 3.7 — ABNORMAL HIGH (ref 2.00–3.50)
Protime: 44.4 Seconds — ABNORMAL HIGH (ref 10.6–13.4)

## 2012-11-12 LAB — POCT INR: INR: 3.7

## 2012-11-12 NOTE — Progress Notes (Signed)
INR elevated today.  INR has been at [place] of 2-3 for > 6 months on coumadin 9mg  daily.  No changes in medication or diet.  No bleeding or unusual bruising.  This INR result may be an outlier.  Will have [name] hold coumadin today then continue coumadin 9mg  daily.  Will check PT/INR in 2 weeks.

## 2012-11-19 DIAGNOSIS — M47817 Spondylosis without myelopathy or radiculopathy, lumbosacral region: Secondary | ICD-10-CM | POA: Insufficient documentation

## 2012-11-28 ENCOUNTER — Ambulatory Visit (HOSPITAL_BASED_OUTPATIENT_CLINIC_OR_DEPARTMENT_OTHER): Payer: Medicare Other | Admitting: Pharmacist

## 2012-11-28 ENCOUNTER — Other Ambulatory Visit: Payer: Medicare Other | Admitting: Lab

## 2012-11-28 DIAGNOSIS — I82403 Acute embolism and thrombosis of unspecified deep veins of lower extremity, bilateral: Secondary | ICD-10-CM

## 2012-11-28 DIAGNOSIS — I82409 Acute embolism and thrombosis of unspecified deep veins of unspecified lower extremity: Secondary | ICD-10-CM

## 2012-11-28 LAB — PROTIME-INR
INR: 2.8 (ref 2.00–3.50)
Protime: 33.6 Seconds — ABNORMAL HIGH (ref 10.6–13.4)

## 2012-11-28 LAB — POCT INR: INR: 2.8

## 2012-11-28 IMAGING — CR DG CHEST 1V PORT
1 series · 1 of 1 positions shown · non-contrast
Comparison: 04/14/2010

CLINICAL DATA: History of chest pain, COPD, hypertension, and
diabetes

PORTABLE CHEST - 1 VIEW

[view not recorded]
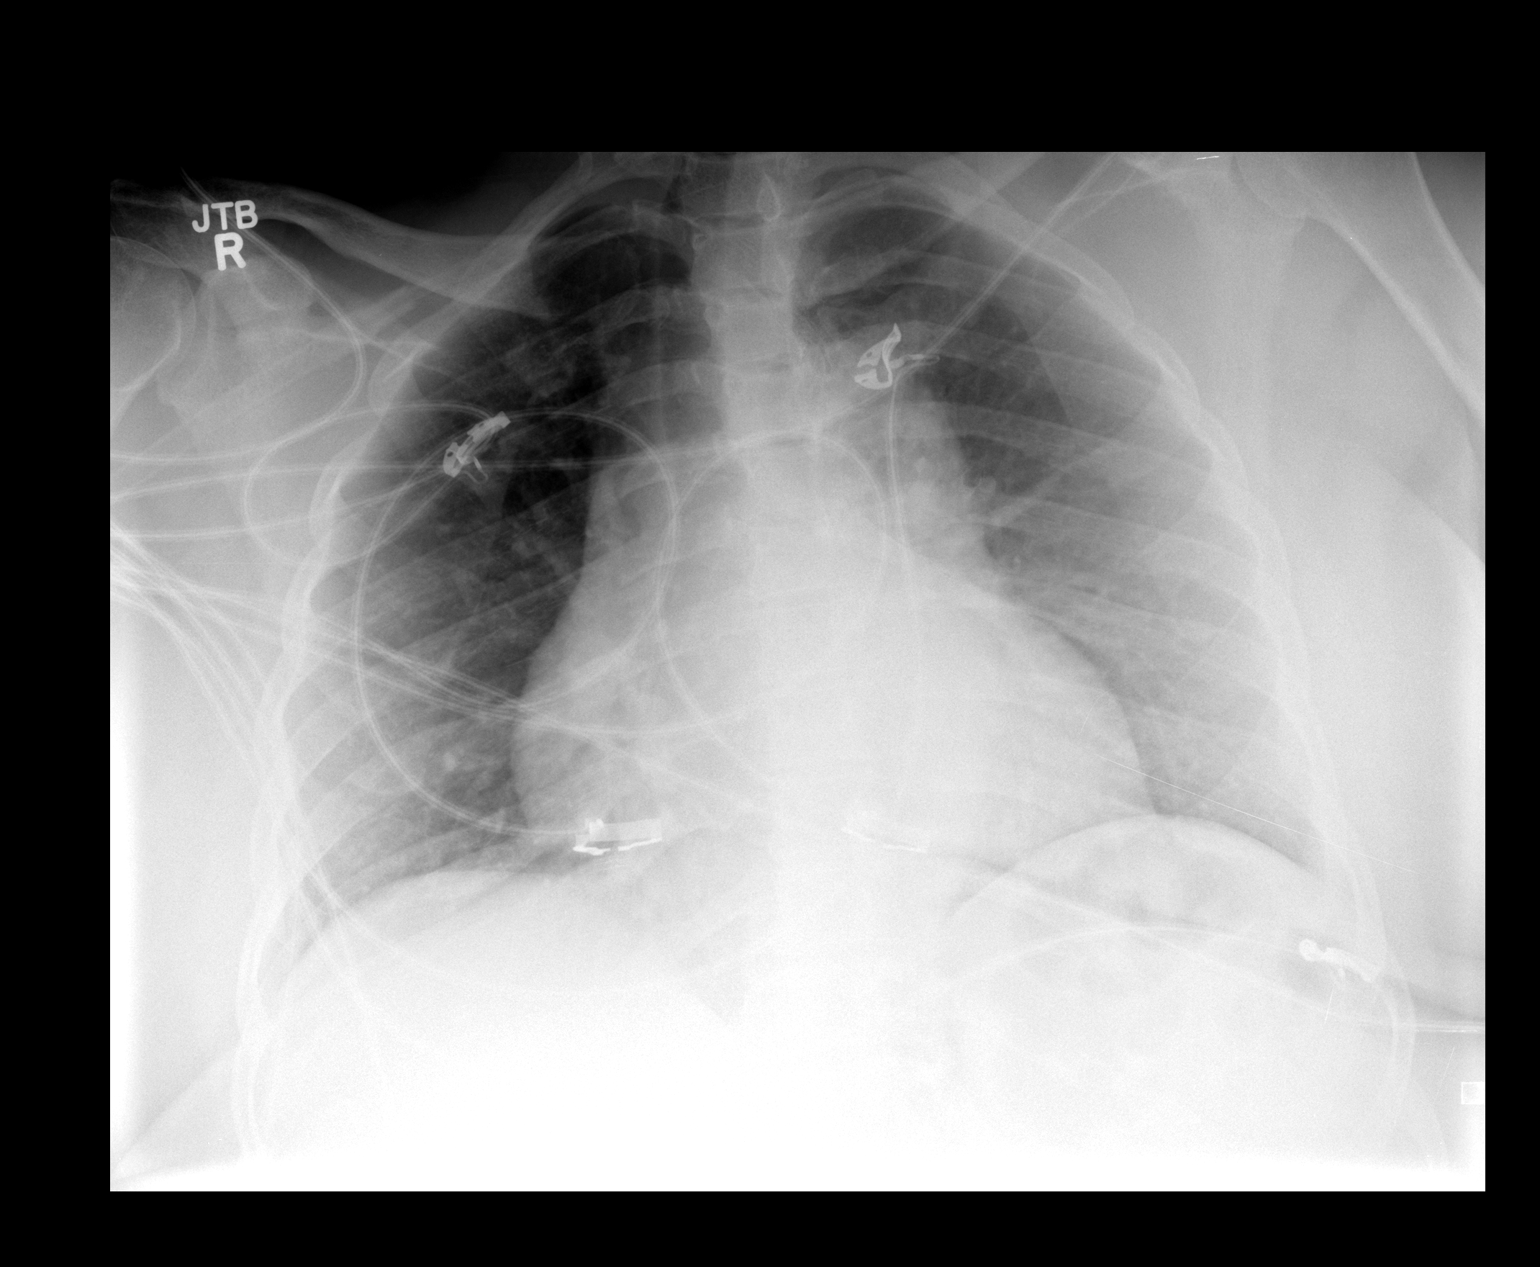

[1 of 1 positions shown; findings below may reference images not displayed]

FINDINGS: There is minimal enlargement of the cardiac silhouette
accentuated by AP magnification. Ectasia and nonaneurysmal
calcification of the thoracic aorta are seen. The lungs are well
aerated and free of infiltrates.  No pneumothorax is evident. No
pleural abnormality is evident.  Previous anterior cervical fusion
has been performed with hardware in place.  No disruption of
hardware is seen.
IMPRESSION: Minimal enlargement of the cardiac silhouette.  No pulmonary edema,
pneumonia, or pleural effusion is seen.  Previous anterior cervical
fusion.

## 2012-11-28 NOTE — Patient Instructions (Signed)
Continue Coumadin 9mg  daily.   Recheck INR in 4 weeks on 12/26/12; Lab at 9am and coumadin clinic at 9:15am.

## 2012-11-28 NOTE — Progress Notes (Addendum)
INR within [place] today. No problems to report regarding anticoagulation. No bleeding. One plum-size bruise near her right elbow. She is not sure where is came from. Not painful. Healing.  No s/s of clotting noted. No changes in diet or medications. No missed coumadin doses (other than the one as instructed at last visit). Will continue Coumadin 9mg  daily.   Recheck INR in 4 weeks on 12/26/12; Lab at 9am and coumadin clinic at 9:15am.  Coumadin 5mg  ( x 20 tablets provided) Lot: [id number], Exp: 2/16 Coumadin 4mg  (x 40 tablets provided) Lot: [id number], Exp: 10/15

## 2012-12-24 ENCOUNTER — Ambulatory Visit: Payer: Self-pay | Admitting: Podiatry

## 2012-12-25 ENCOUNTER — Other Ambulatory Visit: Payer: Self-pay | Admitting: Pharmacist

## 2012-12-25 DIAGNOSIS — I82509 Chronic embolism and thrombosis of unspecified deep veins of unspecified lower extremity: Secondary | ICD-10-CM

## 2012-12-26 ENCOUNTER — Telehealth: Payer: Self-pay | Admitting: Pharmacist

## 2012-12-26 ENCOUNTER — Encounter (INDEPENDENT_AMBULATORY_CARE_PROVIDER_SITE_OTHER): Payer: Self-pay

## 2012-12-26 ENCOUNTER — Ambulatory Visit: Payer: Medicare Other

## 2012-12-26 ENCOUNTER — Other Ambulatory Visit: Payer: Medicare Other | Admitting: Lab

## 2012-12-26 NOTE — Telephone Encounter (Signed)
Called pt to r/s missed coumadin clinic appmt R/S for Mon, 12/30/12 at 10:00 lab and 10:15 CC

## 2012-12-30 ENCOUNTER — Ambulatory Visit: Payer: Medicare Other

## 2012-12-30 ENCOUNTER — Other Ambulatory Visit: Payer: Medicare Other | Admitting: Lab

## 2013-01-08 ENCOUNTER — Encounter (INDEPENDENT_AMBULATORY_CARE_PROVIDER_SITE_OTHER): Payer: Self-pay

## 2013-01-08 ENCOUNTER — Ambulatory Visit (HOSPITAL_BASED_OUTPATIENT_CLINIC_OR_DEPARTMENT_OTHER): Payer: Medicare Other | Admitting: Pharmacist

## 2013-01-08 ENCOUNTER — Other Ambulatory Visit (HOSPITAL_BASED_OUTPATIENT_CLINIC_OR_DEPARTMENT_OTHER): Payer: Medicare Other | Admitting: Lab

## 2013-01-08 DIAGNOSIS — I82403 Acute embolism and thrombosis of unspecified deep veins of lower extremity, bilateral: Secondary | ICD-10-CM

## 2013-01-08 DIAGNOSIS — I82409 Acute embolism and thrombosis of unspecified deep veins of unspecified lower extremity: Secondary | ICD-10-CM

## 2013-01-08 DIAGNOSIS — I82509 Chronic embolism and thrombosis of unspecified deep veins of unspecified lower extremity: Secondary | ICD-10-CM

## 2013-01-08 LAB — PROTIME-INR
INR: 4 — ABNORMAL HIGH (ref 2.00–3.50)
Protime: 48 Seconds — ABNORMAL HIGH (ref 10.6–13.4)

## 2013-01-08 LAB — POCT INR: INR: 4

## 2013-01-08 NOTE — Progress Notes (Signed)
INR = 4 on Coumadin 9 mg/day Pt started an anti-inflammatory drug = Mobic.  She is on this daily x 7 days total then she is to use PRN inflammation. She also was RX'd Diclofenac on 11/19/12 for her back but she has not started taking it. No bleeding.  She did have a bruise on inner aspect of forearm but it has resolved. INR elevated.  Likely related to Mobic use. Hold today's Coumadin dose.  Reduce to 9 mg/day except 5 mg on Fridays. She will need more samples when she comes back on Monday (01/13/13) to have her INR rechecked. Ebony Hail, Pharm.D., CPP 01/08/2013@2 :19 PM

## 2013-01-13 ENCOUNTER — Other Ambulatory Visit: Payer: Medicare Other | Admitting: Lab

## 2013-01-13 ENCOUNTER — Ambulatory Visit: Payer: Medicare Other

## 2013-01-21 ENCOUNTER — Other Ambulatory Visit (HOSPITAL_BASED_OUTPATIENT_CLINIC_OR_DEPARTMENT_OTHER): Payer: Medicare Other | Admitting: Lab

## 2013-01-21 ENCOUNTER — Ambulatory Visit (HOSPITAL_BASED_OUTPATIENT_CLINIC_OR_DEPARTMENT_OTHER): Payer: Medicare Other | Admitting: Pharmacist

## 2013-01-21 DIAGNOSIS — I82509 Chronic embolism and thrombosis of unspecified deep veins of unspecified lower extremity: Secondary | ICD-10-CM

## 2013-01-21 DIAGNOSIS — I82409 Acute embolism and thrombosis of unspecified deep veins of unspecified lower extremity: Secondary | ICD-10-CM

## 2013-01-21 DIAGNOSIS — I82403 Acute embolism and thrombosis of unspecified deep veins of lower extremity, bilateral: Secondary | ICD-10-CM

## 2013-01-21 LAB — PROTIME-INR
INR: 2.7 (ref 2.00–3.50)
Protime: 32.4 Seconds — ABNORMAL HIGH (ref 10.6–13.4)

## 2013-01-21 LAB — POCT INR: INR: 2.7

## 2013-01-21 NOTE — Progress Notes (Signed)
INR = 2.7 after holding x 1 dose on 01/08/13 then decreasing dose to 9 mg/day; 5 mg Fridays. Pt has not been on scheduled Mobic.  She is now using PRN; her last dose was on Sat. No bleeding/bruising. No other med changes. INR back at [facility].  Pt using less NSAID's. Continue Coumadin 9 mg/day; 5 mg Fridays. Return in 3-4 weeks. Samples: Coumadin 4 mg x 20 tabs (lot #[id number]; exp 12/2013); Coumadin 5 mg x 20 tabs (lot #[id number]; exp 04/2014) Ebony Hail, Pharm.D., CPP 01/21/2013@1 :24 PM

## 2013-02-12 ENCOUNTER — Ambulatory Visit: Payer: Medicare Other

## 2013-02-12 ENCOUNTER — Other Ambulatory Visit: Payer: Medicare Other | Admitting: Lab

## 2013-02-17 ENCOUNTER — Other Ambulatory Visit (HOSPITAL_BASED_OUTPATIENT_CLINIC_OR_DEPARTMENT_OTHER): Payer: Medicare Other

## 2013-02-17 ENCOUNTER — Encounter (HOSPITAL_COMMUNITY): Payer: Self-pay | Admitting: Emergency Medicine

## 2013-02-17 ENCOUNTER — Ambulatory Visit (HOSPITAL_BASED_OUTPATIENT_CLINIC_OR_DEPARTMENT_OTHER): Payer: Medicare Other | Admitting: Pharmacist

## 2013-02-17 ENCOUNTER — Emergency Department (HOSPITAL_COMMUNITY)
Admission: EM | Admit: 2013-02-17 | Discharge: 2013-02-17 | Disposition: A | Discharge status: Home / Self Care | Payer: Medicare Other | Attending: Emergency Medicine | Admitting: Emergency Medicine

## 2013-02-17 DIAGNOSIS — M549 Dorsalgia, unspecified: Secondary | ICD-10-CM

## 2013-02-17 DIAGNOSIS — I10 Essential (primary) hypertension: Secondary | ICD-10-CM | POA: Insufficient documentation

## 2013-02-17 DIAGNOSIS — G4730 Sleep apnea, unspecified: Secondary | ICD-10-CM | POA: Insufficient documentation

## 2013-02-17 DIAGNOSIS — J4489 Other specified chronic obstructive pulmonary disease: Secondary | ICD-10-CM | POA: Insufficient documentation

## 2013-02-17 DIAGNOSIS — I82509 Chronic embolism and thrombosis of unspecified deep veins of unspecified lower extremity: Secondary | ICD-10-CM

## 2013-02-17 DIAGNOSIS — E119 Type 2 diabetes mellitus without complications: Secondary | ICD-10-CM | POA: Insufficient documentation

## 2013-02-17 DIAGNOSIS — F329 Major depressive disorder, single episode, unspecified: Secondary | ICD-10-CM | POA: Insufficient documentation

## 2013-02-17 DIAGNOSIS — I82409 Acute embolism and thrombosis of unspecified deep veins of unspecified lower extremity: Secondary | ICD-10-CM

## 2013-02-17 DIAGNOSIS — M545 Low back pain, unspecified: Secondary | ICD-10-CM | POA: Insufficient documentation

## 2013-02-17 DIAGNOSIS — I82403 Acute embolism and thrombosis of unspecified deep veins of lower extremity, bilateral: Secondary | ICD-10-CM

## 2013-02-17 DIAGNOSIS — F3289 Other specified depressive episodes: Secondary | ICD-10-CM | POA: Insufficient documentation

## 2013-02-17 DIAGNOSIS — Z86718 Personal history of other venous thrombosis and embolism: Secondary | ICD-10-CM | POA: Insufficient documentation

## 2013-02-17 DIAGNOSIS — Z9981 Dependence on supplemental oxygen: Secondary | ICD-10-CM | POA: Insufficient documentation

## 2013-02-17 DIAGNOSIS — M129 Arthropathy, unspecified: Secondary | ICD-10-CM | POA: Insufficient documentation

## 2013-02-17 DIAGNOSIS — R011 Cardiac murmur, unspecified: Secondary | ICD-10-CM | POA: Insufficient documentation

## 2013-02-17 DIAGNOSIS — K219 Gastro-esophageal reflux disease without esophagitis: Secondary | ICD-10-CM | POA: Insufficient documentation

## 2013-02-17 DIAGNOSIS — Z888 Allergy status to other drugs, medicaments and biological substances status: Secondary | ICD-10-CM | POA: Insufficient documentation

## 2013-02-17 DIAGNOSIS — M79609 Pain in unspecified limb: Secondary | ICD-10-CM | POA: Insufficient documentation

## 2013-02-17 DIAGNOSIS — Z79899 Other long term (current) drug therapy: Secondary | ICD-10-CM | POA: Insufficient documentation

## 2013-02-17 DIAGNOSIS — IMO0001 Reserved for inherently not codable concepts without codable children: Secondary | ICD-10-CM | POA: Insufficient documentation

## 2013-02-17 DIAGNOSIS — G8929 Other chronic pain: Secondary | ICD-10-CM | POA: Insufficient documentation

## 2013-02-17 DIAGNOSIS — D689 Coagulation defect, unspecified: Secondary | ICD-10-CM | POA: Insufficient documentation

## 2013-02-17 DIAGNOSIS — Z7901 Long term (current) use of anticoagulants: Secondary | ICD-10-CM | POA: Insufficient documentation

## 2013-02-17 DIAGNOSIS — Z9189 Other specified personal risk factors, not elsewhere classified: Secondary | ICD-10-CM | POA: Insufficient documentation

## 2013-02-17 DIAGNOSIS — J449 Chronic obstructive pulmonary disease, unspecified: Secondary | ICD-10-CM | POA: Insufficient documentation

## 2013-02-17 DIAGNOSIS — Z8709 Personal history of other diseases of the respiratory system: Secondary | ICD-10-CM | POA: Insufficient documentation

## 2013-02-17 LAB — POCT INR: INR: 1.8

## 2013-02-17 LAB — PROTIME-INR
INR: 1.8 — ABNORMAL LOW (ref 2.00–3.50)
Protime: 21.6 Seconds — ABNORMAL HIGH (ref 10.6–13.4)

## 2013-02-17 MED ORDER — ORPHENADRINE CITRATE ER 100 MG PO TB12: 100.0000 mg | ORAL_TABLET | Freq: Two times a day (BID) | ORAL | Status: DC

## 2013-02-17 MED ORDER — KETOROLAC TROMETHAMINE 60 MG/2ML IM SOLN
60.0000 mg | Freq: Once | INTRAMUSCULAR | Status: AC
  Administered 2013-02-17: 60 mg via INTRAMUSCULAR
  Filled 2013-02-17: qty 2

## 2013-02-17 NOTE — ED Notes (Signed)
Pt comfortable with d/c and f/u instructions. Prescriptions x1 

## 2013-02-17 NOTE — ED Notes (Signed)
Pt is here with lower back pain and reports back surgery last year.  Pt states that the pain radiates down her legs for the last week.  No abdominal pain and no incontinence

## 2013-02-17 NOTE — Addendum Note (Signed)
Addended by: Mardi Mainland on: 02/17/2013 11:32 AM   Modules accepted: Level of Service

## 2013-02-17 NOTE — ED Provider Notes (Signed)
CSN: [id number]     Arrival date & time 02/17/13  1237 History  This chart was scribed for Irish Elders, NP, working with Leonette Most B. Bernette Mayers, MD, by Ardelia Mems ED Scribe. This patient was seen in room TR06C/TR06C and the patient's care was started at 1:52 PM.   Chief Complaint  Patient presents with  . Back Pain    The history is provided by the patient. No language interpreter was used.    HPI Comments: Stanislawa Gaffin [place] is a 57 y.o. female who presents to the Emergency Department complaining of constant, moderate lower right back pain that radiates down her right leg onset today. She states that she has a history of chronic back pain with intermittent flare-ups. She reports having a history of back surgery about 1 year ago. She denies bowel or bladder incontinence, urinary symptoms, numbness, paresthesias or any other symptoms. She states that she drove to the ED today.   PCP- Dr. Fleet Contras   Past Medical History  Diagnosis Date  . DVT (deep venous thrombosis)     Recurrent  . Adrenal mass 03/226    Benign  . Lupus Anticoagulant Positive   . Clotting disorder     +beta-2-glycoprotein IgA antibody  . History of cocaine abuse     Remote history   . Heart murmur   . Hypertension     takes meds daily  . Depression   . Shortness of breath     exertion or lying flat  . 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 Sleep apnea     2l of oxygen at night  . On home oxygen therapy     at night  . Diabetes mellitus     120s usually fasting  . GERD (gastroesophageal reflux disease)   . Dizziness, nonspecific   . Arthritis     knees/multiple orthopedic conditons; lower back  . History of blood transfusion   . Fibromyalgia    Past Surgical History  Procedure Laterality Date  . Carpal tunnel release      Bilateral  . Abdominal hysterectomy  1989    Fibroids  . Back surgery      cervical spine---disk disease  . Cesarean section    .  Replacement total knee  11/17/2008    bilateral  . Right elbow surgery    . Tubal ligation    . Gallstones removed    . Anterior lumbar fusion  01/03/2012    Procedure: ANTERIOR LUMBAR FUSION 1 LEVEL;  Surgeon: Carmela Hurt, MD;  Location: [facility];  Service: Neurosurgery;  Laterality: N/A;  Lumbar Four-Five Anterior Lumbar Interbody Fusion with Instrumentation   Family History  Problem Relation Age of Onset  . Hypertension Father   . Heart failure Father   . Hypertension Mother   . Diabetes Mother   . Cancer Mother     Pancreatic   History  Substance Use Topics  . Smoking status: Never Smoker   . Smokeless tobacco: Never Used  . Alcohol Use: No   OB History   Grav Para Term Preterm Abortions TAB SAB Ect Mult Living                 Review of Systems  Gastrointestinal:       Denies bowel incontinence.  Genitourinary: Negative for dysuria.       Denies bladder incontinence.  Musculoskeletal: Positive for back pain.  Neurological: Negative for numbness.  Denies paresthesias.  All other systems reviewed and are negative.   Allergies  Corticosteroids  Home Medications   Current Outpatient Rx  Name  Route  Sig  Dispense  Refill  . albuterol (PROVENTIL HFA;VENTOLIN HFA) 108 (90 BASE) MCG/ACT inhaler   Inhalation   Inhale 2 puffs into the lungs every 4 (four) hours as needed. For shortness of breath         . amLODipine (NORVASC) 5 MG tablet   Oral   Take 5 mg by mouth daily.          . budesonide-formoterol (SYMBICORT) 80-4.5 MCG/ACT inhaler   Inhalation   Inhale 2 puffs into the lungs 2 (two) times daily.          . cloNIDine (CATAPRES) 0.1 MG tablet   Oral   Take 0.1 mg by mouth 2 (two) times daily.          . cyclobenzaprine (FLEXERIL) 10 MG tablet   Oral   Take 1 tablet (10 mg total) by mouth 2 (two) times daily as needed for muscle spasms.   20 tablet   0   . diclofenac (VOLTAREN) 75 MG EC tablet   Oral   Take 75 mg by mouth 2  (two) times daily.         Marland Kitchen diltiazem (CARDIZEM CD) 180 MG 24 hr capsule   Oral   Take 180 mg by mouth daily.          . DULoxetine (CYMBALTA) 60 MG capsule   Oral   Take 60 mg by mouth daily.          Marland Kitchen gabapentin (NEURONTIN) 300 MG capsule   Oral   Take 300 mg by mouth Three times a day.          . loratadine (CLARITIN) 10 MG tablet   Oral   Take 10 mg by mouth daily as needed. For allergies.         . meloxicam (MOBIC) 7.5 MG tablet   Oral   Take 7.5 mg by mouth daily. Take 1 daily x 7 days then PRN inflammation         . metFORMIN (GLUCOPHAGE) 500 MG tablet   Oral   Take 500 mg by mouth at bedtime.          Marland Kitchen omeprazole (PRILOSEC) 20 MG capsule   Oral   Take 20 mg by mouth 2 (two) times daily.         . orphenadrine (NORFLEX) 100 MG tablet   Oral   Take 1 tablet (100 mg total) by mouth 2 (two) times daily.   20 tablet   0   . polyethylene glycol (MIRALAX / GLYCOLAX) packet   Oral   Take 17 g by mouth daily.          . pravastatin (PRAVACHOL) 40 MG tablet   Oral   Take 1 tablet by mouth daily.         . QUEtiapine (SEROQUEL) 50 MG tablet   Oral   Take 50 mg by mouth 2 (two) times daily.          . traMADol (ULTRAM) 50 MG tablet   Oral   Take 50 mg by mouth every 6 (six) hours as needed.         . warfarin (COUMADIN) 5 MG tablet   Oral   Take 10-15 mg by mouth daily. 2 tabs (10 mg total) MWFSun; All other days 3 tabs (15 mg  total)          Triage Vitals: BP 146/88  Pulse 90  Temp(Src) 98.1 F (36.7 C) (Oral)  Resp 18  Wt 260 lb (117.935 kg)  SpO2 95%  Physical Exam  Nursing note and vitals reviewed. Constitutional: She is oriented to person, place, and time. She appears well-developed and well-nourished. No distress.  HENT:  Head: Normocephalic and atraumatic.  Eyes: EOM are normal.  Neck: Neck supple. No tracheal deviation present.  Cardiovascular: Normal rate.   Pulmonary/Chest: Effort normal. No respiratory  distress.  Musculoskeletal: Normal range of motion.  Neurological: She is alert and oriented to person, place, and time.  Skin: Skin is warm and dry.  Psychiatric: She has a normal mood and affect. Her behavior is normal.    ED Course  Procedures (including critical care time)  DIAGNOSTIC STUDIES: Oxygen Saturation is 95% on RA, normal by my interpretation.    COORDINATION OF CARE: 1:56 PM- Will order Toradol while pt is in the ED. Will discharge with Norflex. Pt advised of plan for treatment and pt agrees.  Labs Review Labs Reviewed - No data to display Imaging Review No results found.  EKG Interpretation   None       MDM   1. Back pain     She reports this to be very typical of her chronic back pain that she has. She reports that she periodically has flare-ups where her pain is worse. No difficulty ambulating, no numbness or tingling. No incontinence of bowel or bladder. Lower back pain, paravertebral tenderness. No abdominal pain or other associated symptoms. Reassuring exam. No focal deficits or weakness.  I personally performed the services described in this documentation, which was scribed in my presence. The recorded information has been reviewed and is accurate.    Irish Elders, NP 02/19/13 2223

## 2013-02-17 NOTE — ED Notes (Addendum)
PT HAD BACK SX PER DR. CABBELL NOV 2013. HAS HAD ONE EPISODE OF BACK PAIN SINCE THEN UNTIL TODAY. C/O RIGHT LOWER BACK PAIN. DENIES NUMBNESS OR TINGLING IN EXTREMITIES. HAS APPOINTMENT WITH DR. CABBELL NEXT MONTH.

## 2013-02-17 NOTE — Progress Notes (Signed)
INR = 1.8   [place] 2-3 INR just below [place] range. Patient states she has been eating a lot of greens over Thanksgiving, she feels this is why her INR is low.  This is not something she will be continuing, she does not eat a lot of greens over Christmas. She has had no medication changes. She has no complaints of bleeding/bruising. She will return to [facility] 03[facility]17[facility]13 for lab at 8:15 am, [facility] Clinic at 8:30 am.  [name], PharmD  Coumadin 4mg  samples given.    #40 tablets   Lot: [id number]  Exp: 10/16

## 2013-02-20 NOTE — ED Provider Notes (Signed)
Medical screening examination/treatment/procedure(s) were performed by non-physician practitioner and as supervising physician I was immediately available for consultation/collaboration.  EKG Interpretation   None         Charles B. Bernette Mayers, MD 02/20/13 0010

## 2013-03-17 ENCOUNTER — Other Ambulatory Visit: Payer: Medicare Other

## 2013-03-17 ENCOUNTER — Ambulatory Visit: Payer: Medicare Other

## 2013-03-17 ENCOUNTER — Telehealth: Payer: Self-pay | Admitting: Pharmacist

## 2013-03-17 NOTE — Telephone Encounter (Signed)
Patient FTKA today for lab and coumadin clinic. Called and spoke to patient and she forgot about her visit this morning. Rescheduled to Wednesday 03/19/13 at 8:15 am for lab and 8:30am for coumadin clinic. Scheduling aware.  Christell Faith, PharmD

## 2013-03-19 ENCOUNTER — Ambulatory Visit (HOSPITAL_BASED_OUTPATIENT_CLINIC_OR_DEPARTMENT_OTHER): Payer: Medicare Other | Admitting: Pharmacist

## 2013-03-19 ENCOUNTER — Other Ambulatory Visit (HOSPITAL_BASED_OUTPATIENT_CLINIC_OR_DEPARTMENT_OTHER): Payer: Medicare Other

## 2013-03-19 DIAGNOSIS — I82409 Acute embolism and thrombosis of unspecified deep veins of unspecified lower extremity: Secondary | ICD-10-CM

## 2013-03-19 DIAGNOSIS — Z7901 Long term (current) use of anticoagulants: Secondary | ICD-10-CM

## 2013-03-19 DIAGNOSIS — I82509 Chronic embolism and thrombosis of unspecified deep veins of unspecified lower extremity: Secondary | ICD-10-CM

## 2013-03-19 DIAGNOSIS — I82403 Acute embolism and thrombosis of unspecified deep veins of lower extremity, bilateral: Secondary | ICD-10-CM

## 2013-03-19 LAB — PROTIME-INR
INR: 1.8 — ABNORMAL LOW (ref 2.00–3.50)
Protime: 21.6 Seconds — ABNORMAL HIGH (ref 10.6–13.4)

## 2013-03-19 LAB — POCT INR: INR: 1.8

## 2013-03-19 NOTE — Progress Notes (Signed)
INR slightly below [place] today. INR unchanged from previous visit. No missed doses.  Pt no longer taking Diclofenac. She has been eating more collard greens with the holidays. No s/s of clotting noted. No problems to report regarding anticoagulation. Take coumadin 12.5mg  today.  Then on 03/20/13, continue Coumadin 9mg  daily except 5 mg on Fridays.   Recheck INR in 2 weeks on 04/01/13; Lab at 10 am and coumadin clinic at 10:15 am. May need to increased coumadin to 9mg  daily if INR remains below [place] at next visit.  Coumadin 5mg  samples provided: (x 10 tablets) Lot: [id number], Exp: 6/16

## 2013-03-19 NOTE — Patient Instructions (Signed)
Take coumadin 12.5mg  today.  Then on 03/20/13, continue Coumadin 9mg  daily except 5 mg on Fridays.   Recheck INR in 2 weeks on 04/01/13; Lab at 10 am and coumadin clinic at 10:15 am.

## 2013-04-01 ENCOUNTER — Other Ambulatory Visit (HOSPITAL_BASED_OUTPATIENT_CLINIC_OR_DEPARTMENT_OTHER): Payer: Medicare Other

## 2013-04-01 ENCOUNTER — Ambulatory Visit (HOSPITAL_BASED_OUTPATIENT_CLINIC_OR_DEPARTMENT_OTHER): Payer: Medicare Other | Admitting: Pharmacist

## 2013-04-01 DIAGNOSIS — I82409 Acute embolism and thrombosis of unspecified deep veins of unspecified lower extremity: Secondary | ICD-10-CM

## 2013-04-01 DIAGNOSIS — I82509 Chronic embolism and thrombosis of unspecified deep veins of unspecified lower extremity: Secondary | ICD-10-CM

## 2013-04-01 DIAGNOSIS — I82403 Acute embolism and thrombosis of unspecified deep veins of lower extremity, bilateral: Secondary | ICD-10-CM

## 2013-04-01 LAB — PROTIME-INR
INR: 2.4 (ref 2.00–3.50)
Protime: 28.8 Seconds — ABNORMAL HIGH (ref 10.6–13.4)

## 2013-04-01 LAB — POCT INR: INR: 2.4

## 2013-04-01 NOTE — Progress Notes (Signed)
INR at [place] of 2-3.  No changes in medication.  Ms Prehn states that she is trying to lose weight so is not eating as much.  Will continue coumadin 9mg  daily but 5mg  on Friday.  Will check PT/INR in 1 month.

## 2013-04-29 ENCOUNTER — Other Ambulatory Visit (HOSPITAL_BASED_OUTPATIENT_CLINIC_OR_DEPARTMENT_OTHER): Payer: Medicare Other

## 2013-04-29 ENCOUNTER — Ambulatory Visit (HOSPITAL_BASED_OUTPATIENT_CLINIC_OR_DEPARTMENT_OTHER): Payer: Medicare Other | Admitting: Pharmacist

## 2013-04-29 DIAGNOSIS — I82509 Chronic embolism and thrombosis of unspecified deep veins of unspecified lower extremity: Secondary | ICD-10-CM

## 2013-04-29 DIAGNOSIS — I82403 Acute embolism and thrombosis of unspecified deep veins of lower extremity, bilateral: Secondary | ICD-10-CM

## 2013-04-29 LAB — PROTIME-INR
INR: 1.2 — ABNORMAL LOW (ref 2.00–3.50)
Protime: 14.4 Seconds — ABNORMAL HIGH (ref 10.6–13.4)

## 2013-04-29 LAB — POCT INR: INR: 1.2

## 2013-04-29 MED ORDER — WARFARIN SODIUM 4 MG PO TABS: 4.0000 mg | ORAL_TABLET | Freq: Every day | ORAL | Status: DC

## 2013-04-29 NOTE — Progress Notes (Signed)
INR below [place] of 2-3 today.  Ms Lipkin has run out of warfarin 4mg  tablets and has only been taking 5mg  tablets for the the past week.  Ms Fickett usual warfarin dose is 9mg  daily and 5mg  on Friday.  No other changes.  Will continue current coumadin dose.  I have given Ms Collard coumadin 4mg  samples and call an rx into pharmacy.  Will check PT/INR in 2-3 weeks to ensure INR back at [place].  Coumadin 4mg  #10 samples given: [id number] Exp 12/2013

## 2013-05-21 ENCOUNTER — Other Ambulatory Visit: Payer: Medicare Other

## 2013-05-21 ENCOUNTER — Ambulatory Visit: Payer: Medicare Other

## 2013-05-22 IMAGING — US IR IVC FILTER PLMT / S&I /IMG GUID/MOD SED
1 series · 1 of 1 positions shown · non-contrast
Comparison: none

Clinical Data/Indication:  DVT.  KNEE SURGERY.

IVC FILTER.  IVC VENOGRAM.  ULTRASOUND GUIDANCE FOR VASCULAR
ACCESS.
Sedation: Versed 1.5 mg, Fentanyl 75 mcg.
Total Moderate Sedation Time: 10 minutes.
Contrast Volume: 35 ml 1mnipaque-5WW.
Additional Medications: None.
Fluoroscopy Time: 0.9 minutes.
Procedure: The procedure, risks, benefits, and alternatives were
explained to the patient. Questions regarding the procedure were
encouraged and answered. The patient understands and consents to
the procedure.
The right neck was prepped with Betadine in a sterile fashion, and
a sterile drape was applied covering the operative field. A sterile
gown and sterile gloves were used for the procedure.
The right internal jugular vein was noted to be patent initially
with ultrasound.  Under sonographic guidance, a micropuncture
needle was inserted into the right internal jugular vein
(Ultrasound image documentation was performed). It was removed over
an 018 wire which was upsized to Shamar Lincoln.  The sheath was inserted
over the wire and into the IVC.  IVC venography was performed.
The Celect filter was then deployed in the infrarenal IVC.  The
sheath was removed and hemostasis was achieved with direct
pressure.

[Series 1: ir ivc filter plmt / s&i /img guid/mod sed · 1 of 1 slices shown]
[im 1/1]
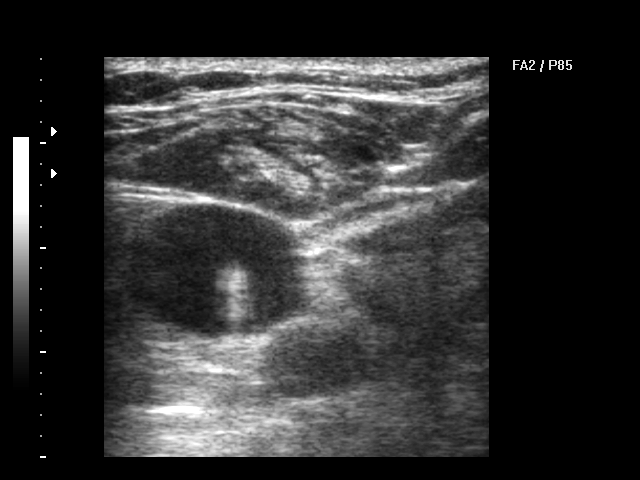

[1 of 1 positions shown; findings below may reference images not displayed]

FINDINGS: IVC venography demonstrates no venous anomaly and no DVT.
The image demonstrates placement of an IVC filter with its tip at
the L1-2 disc.

Complications: None
IMPRESSION: Successful infrarenal IVC filter placement.  This is a temporary
filter.  It can be removed or remain in place to become permanent.

## 2013-05-22 IMAGING — CR DG KNEE 1-2V PORT*R*
2 series · 2 of 2 positions shown · non-contrast
Comparison: None

CLINICAL DATA: Right knee replacement surgery.

PORTABLE RIGHT KNEE - 1-2 VIEW

[ap/obl knee]
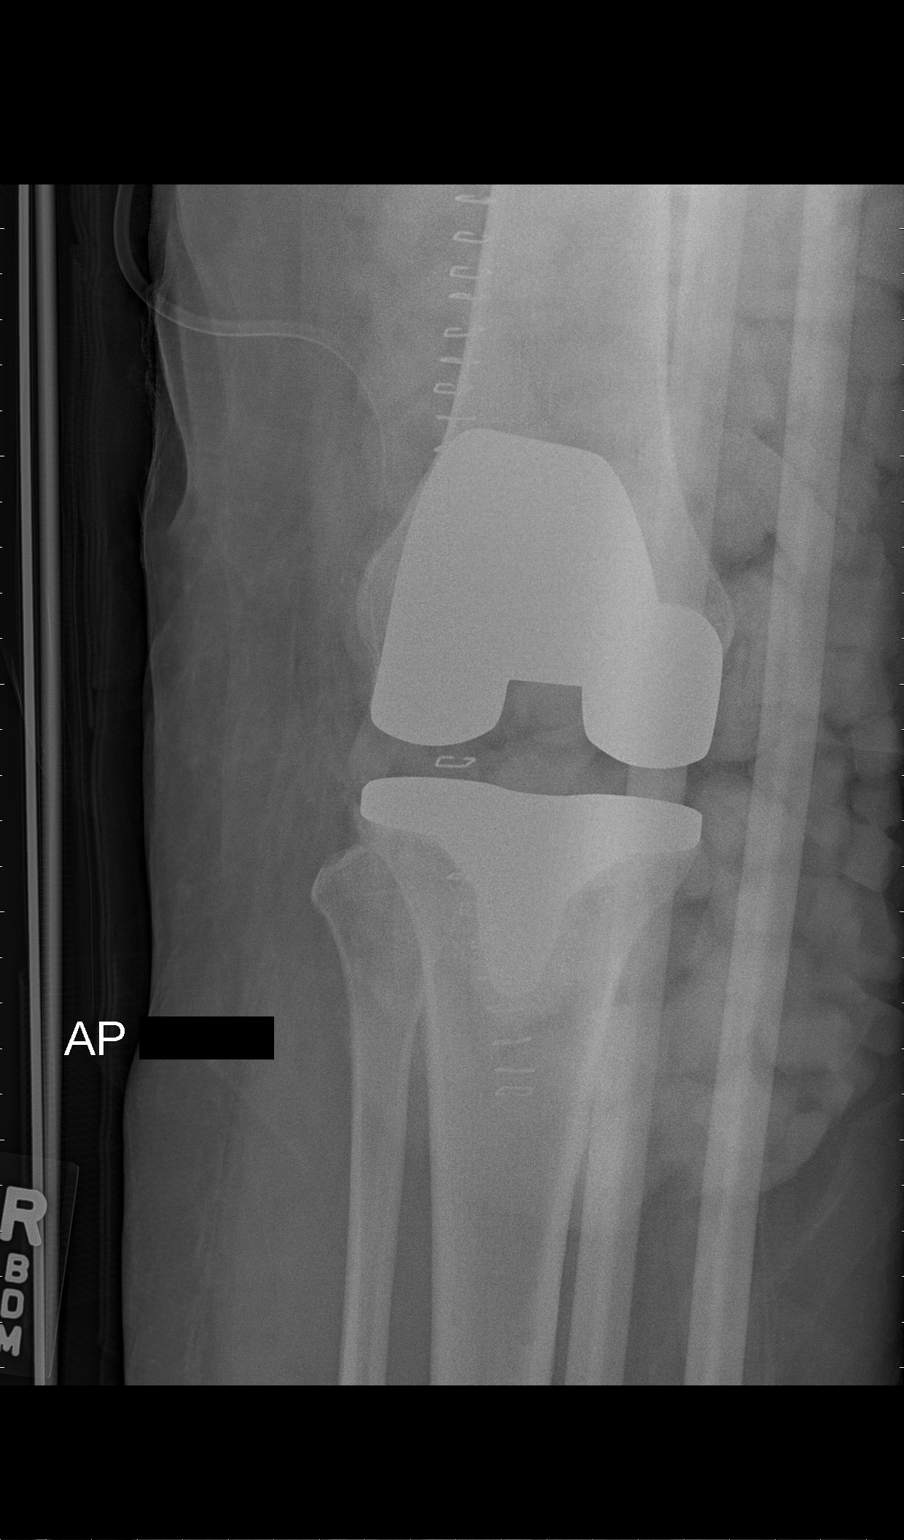

[knee lat]
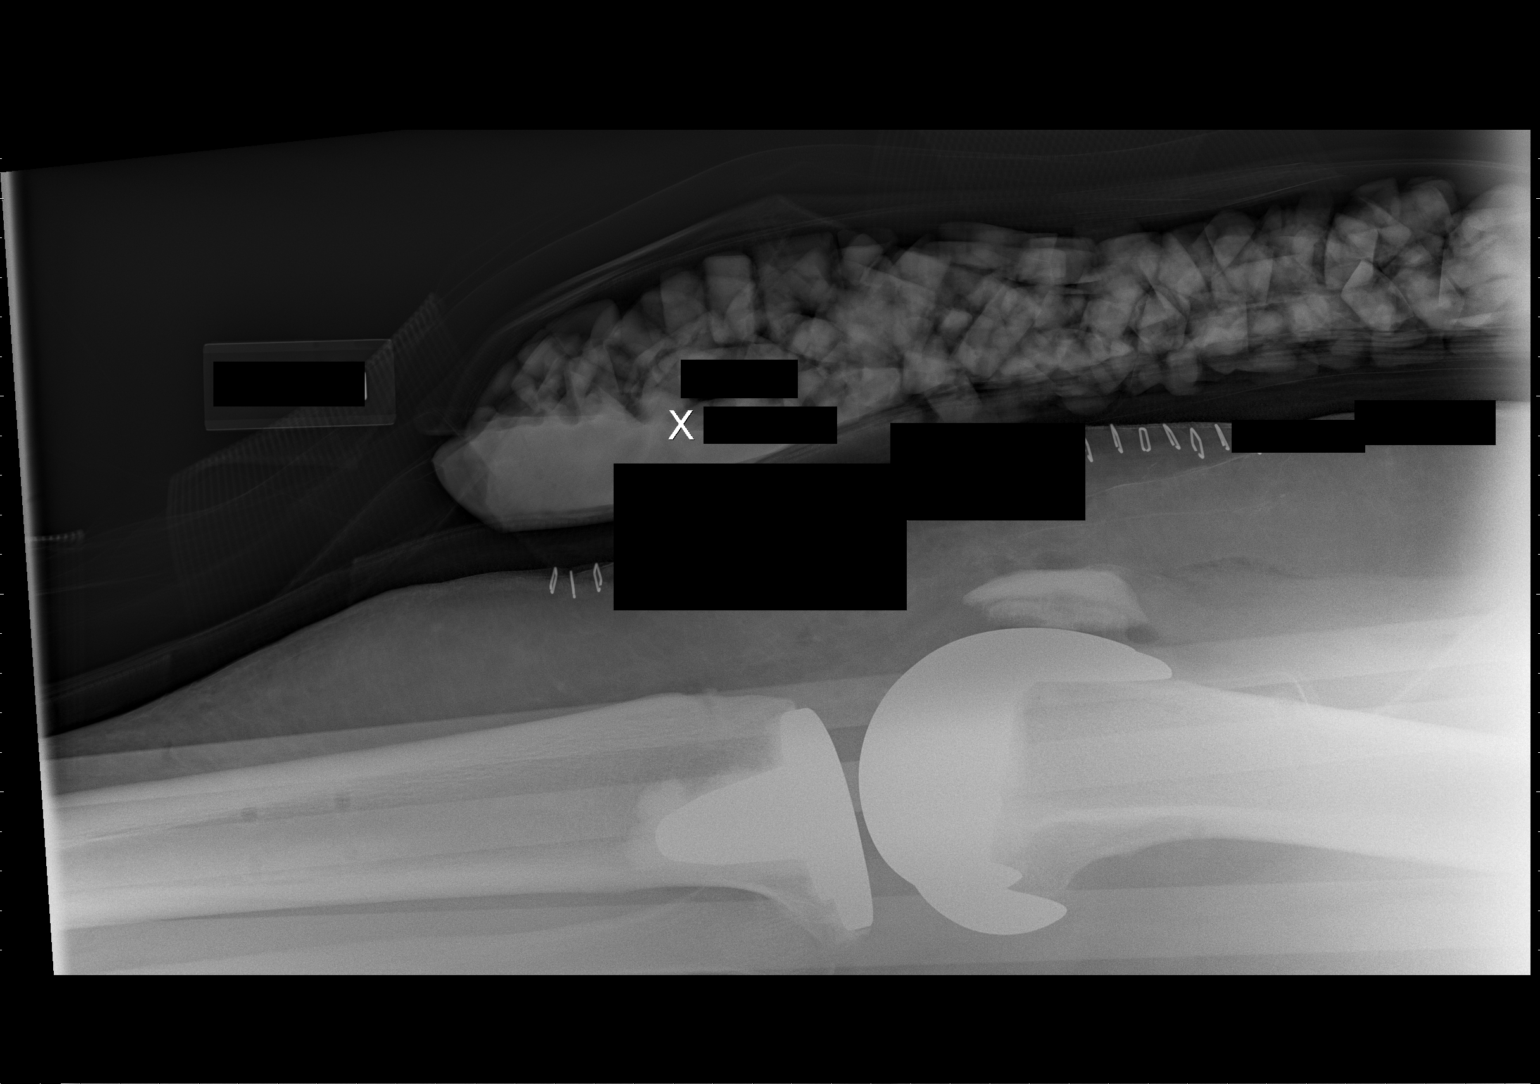

[2 of 2 positions shown; findings below may reference images not displayed]

FINDINGS: The tibial and femoral components are well seated.  No
complicating features.
IMPRESSION: Well seated components of a total right knee arthroplasty.

## 2013-05-23 ENCOUNTER — Ambulatory Visit (HOSPITAL_BASED_OUTPATIENT_CLINIC_OR_DEPARTMENT_OTHER): Payer: Medicare Other | Admitting: Pharmacist

## 2013-05-23 ENCOUNTER — Other Ambulatory Visit (HOSPITAL_BASED_OUTPATIENT_CLINIC_OR_DEPARTMENT_OTHER): Payer: Medicare Other

## 2013-05-23 DIAGNOSIS — I82403 Acute embolism and thrombosis of unspecified deep veins of lower extremity, bilateral: Secondary | ICD-10-CM

## 2013-05-23 DIAGNOSIS — I82509 Chronic embolism and thrombosis of unspecified deep veins of unspecified lower extremity: Secondary | ICD-10-CM

## 2013-05-23 DIAGNOSIS — I82409 Acute embolism and thrombosis of unspecified deep veins of unspecified lower extremity: Secondary | ICD-10-CM

## 2013-05-23 LAB — PROTIME-INR
INR: 3.7 — ABNORMAL HIGH (ref 2.00–3.50)
Protime: 44.4 Seconds — ABNORMAL HIGH (ref 10.6–13.4)

## 2013-05-23 LAB — POCT INR: INR: 3.7

## 2013-05-23 NOTE — Patient Instructions (Signed)
Hold coumadin today.  On 05/24/13, continue Coumadin 9mg  daily except 5mg  on Fridays.   Recheck INR in 2 weeks on 06/06/13; Lab at 10:30am and coumadin clinic at 10:45am.

## 2013-05-23 NOTE — Progress Notes (Signed)
INR above [place] today. No missed or extra doses. No changes in medications. No unusual bruising or bleeding noted. Pt is eating less food, in general, to attempt to loose weight. No significant changes in the types of foods she eats. Will hold coumadin today.  On 05/24/13, continue Coumadin 9mg  daily except 5mg  on Fridays.  Pt has been fairly stable on this dose.  Recheck INR in 2 weeks on 06/06/13; Lab at 10:30am and coumadin clinic at 10:45am. Will evaluate INR and adjust coumadin dose as necessary if INR remains elevated (due to changes in diet) at next visit.

## 2013-06-05 ENCOUNTER — Ambulatory Visit: Payer: Medicare Other | Admitting: Nurse Practitioner

## 2013-06-06 ENCOUNTER — Telehealth: Payer: Self-pay | Admitting: Pharmacist

## 2013-06-06 ENCOUNTER — Other Ambulatory Visit: Payer: Medicare Other

## 2013-06-06 ENCOUNTER — Ambulatory Visit: Payer: Medicare Other

## 2013-06-06 NOTE — Telephone Encounter (Signed)
Pt called pharmacy requesting change of her appts to 06/09/13 since she will be here w/ her mother that morning. Kennith Center, Pharm.D., CPP 06/06/2013@12 :45 PM

## 2013-06-09 ENCOUNTER — Ambulatory Visit (HOSPITAL_BASED_OUTPATIENT_CLINIC_OR_DEPARTMENT_OTHER): Payer: Medicare Other | Admitting: Pharmacist

## 2013-06-09 ENCOUNTER — Other Ambulatory Visit (HOSPITAL_BASED_OUTPATIENT_CLINIC_OR_DEPARTMENT_OTHER): Payer: Medicare Other

## 2013-06-09 DIAGNOSIS — I82509 Chronic embolism and thrombosis of unspecified deep veins of unspecified lower extremity: Secondary | ICD-10-CM

## 2013-06-09 DIAGNOSIS — I82409 Acute embolism and thrombosis of unspecified deep veins of unspecified lower extremity: Secondary | ICD-10-CM

## 2013-06-09 DIAGNOSIS — I82403 Acute embolism and thrombosis of unspecified deep veins of lower extremity, bilateral: Secondary | ICD-10-CM

## 2013-06-09 LAB — POCT INR: INR: 1.6

## 2013-06-09 LAB — PROTIME-INR
INR: 1.6 — ABNORMAL LOW (ref 2.00–3.50)
Protime: 19.2 Seconds — ABNORMAL HIGH (ref 10.6–13.4)

## 2013-06-09 NOTE — Progress Notes (Signed)
INR below [place] today  Patient is doing well with no complaints No missed or extra doses Pt reports eating many servings of collard greens over the weekend on Saturday and Sunday This is the result of her low INR. Pt states "No one else was around to eat them and I did not want to have to throw them out" Ms. Jacot states that about every 2-3 months she gets a craving for collard greens I instructed her to try and be consistent with her diet and try to minimize those big fluctuations in the amount of greens she eats.  Plan for now: No changes as patient has been stable on current dose Continue Coumadin 9mg  daily except 5mg  on Fridays.  Plan:  Recheck INR in 3-4 weeks on 07/07/13; Lab at 10:00am and coumadin clinic at 10:15am.   Samples given:  Coumadin 4 mg x 20 tabs LOT: [id number] Exp: 12/2013

## 2013-06-09 NOTE — Patient Instructions (Signed)
INR below [place] No changes Continue Coumadin 9mg  daily except 5mg  on Fridays.   Recheck INR in 3-4 weeks on 07/07/13; Lab at 10:00am and coumadin clinic at 10:15am.  Try to be consistent with amount of "greens" in your diet

## 2013-06-10 ENCOUNTER — Ambulatory Visit
Admission: RE | Admit: 2013-06-10 | Discharge: 2013-06-10 | Disposition: A | Discharge status: Home / Self Care | Payer: Medicare Other | Source: Ambulatory Visit

## 2013-06-10 DIAGNOSIS — Z1231 Encounter for screening mammogram for malignant neoplasm of breast: Secondary | ICD-10-CM

## 2013-07-04 ENCOUNTER — Telehealth: Payer: Self-pay | Admitting: Pharmacist

## 2013-07-04 NOTE — Telephone Encounter (Signed)
Patient called this morning to reschedule her coumadin appointment. Patient requesting changing appointment from Monday 4/20 to Thursday 07/10/13 to match her mother's doctor appointment on the same day. Lab and Coumadin clinic appointment changed to Thursday 07/10/13. Lab at 10:30am, CC at 10:45am  Thank you, Montel Clock, PharmD

## 2013-07-07 ENCOUNTER — Other Ambulatory Visit: Payer: Medicare Other

## 2013-07-07 ENCOUNTER — Ambulatory Visit: Payer: Medicare Other

## 2013-07-10 ENCOUNTER — Other Ambulatory Visit (HOSPITAL_BASED_OUTPATIENT_CLINIC_OR_DEPARTMENT_OTHER): Payer: Medicare Other

## 2013-07-10 ENCOUNTER — Ambulatory Visit (HOSPITAL_BASED_OUTPATIENT_CLINIC_OR_DEPARTMENT_OTHER): Payer: Medicare Other | Admitting: Pharmacist

## 2013-07-10 DIAGNOSIS — I82509 Chronic embolism and thrombosis of unspecified deep veins of unspecified lower extremity: Secondary | ICD-10-CM

## 2013-07-10 DIAGNOSIS — I82409 Acute embolism and thrombosis of unspecified deep veins of unspecified lower extremity: Secondary | ICD-10-CM

## 2013-07-10 DIAGNOSIS — I82403 Acute embolism and thrombosis of unspecified deep veins of lower extremity, bilateral: Secondary | ICD-10-CM

## 2013-07-10 LAB — PROTIME-INR
INR: 3.5 (ref 2.00–3.50)
Protime: 42 Seconds — ABNORMAL HIGH (ref 10.6–13.4)

## 2013-07-10 LAB — POCT INR: INR: 3.5

## 2013-07-10 NOTE — Patient Instructions (Signed)
INR above [place] Hold coumadin today, Continue Coumadin 9mg  daily except 5mg  on Fridays.   Recheck INR in 3 weeks on 07/31/13; Lab at 10:00am and coumadin clinic at 10:15am.

## 2013-07-10 NOTE — Progress Notes (Signed)
INR above [place] today Pt is doing well with no complaints No unusual bleeding or bruising No missed or extra doses Pt states she is starting a new blood pressure medication but does not know what it is and will call us when she has it filled Plan: Hold coumadin today, Continue Coumadin 9mg  daily except 5mg  on Fridays.   Recheck INR in 3 weeks on 07/31/13; Lab at 10:00am and coumadin clinic at 10:15am.

## 2013-07-15 ENCOUNTER — Other Ambulatory Visit: Payer: Self-pay | Admitting: Neurosurgery

## 2013-07-15 DIAGNOSIS — M47816 Spondylosis without myelopathy or radiculopathy, lumbar region: Secondary | ICD-10-CM

## 2013-07-23 ENCOUNTER — Ambulatory Visit
Admission: RE | Admit: 2013-07-23 | Discharge: 2013-07-23 | Disposition: A | Discharge status: Home / Self Care | Payer: Medicare Other | Source: Ambulatory Visit | Attending: Neurosurgery | Admitting: Neurosurgery

## 2013-07-23 DIAGNOSIS — M47816 Spondylosis without myelopathy or radiculopathy, lumbar region: Secondary | ICD-10-CM

## 2013-07-23 MED ORDER — GADOBENATE DIMEGLUMINE 529 MG/ML IV SOLN
20.0000 mL | Freq: Once | INTRAVENOUS | Status: AC | PRN
  Administered 2013-07-23: 20 mL via INTRAVENOUS

## 2013-07-30 ENCOUNTER — Telehealth: Payer: Self-pay | Admitting: Pharmacist

## 2013-07-31 ENCOUNTER — Other Ambulatory Visit: Payer: Medicare Other

## 2013-07-31 ENCOUNTER — Ambulatory Visit: Payer: Medicare Other

## 2013-08-04 ENCOUNTER — Institutional Professional Consult (permissible substitution): Payer: Medicare Other | Admitting: Interventional Cardiology

## 2013-08-04 ENCOUNTER — Ambulatory Visit (HOSPITAL_BASED_OUTPATIENT_CLINIC_OR_DEPARTMENT_OTHER): Payer: Medicare Other | Admitting: Pharmacist

## 2013-08-04 ENCOUNTER — Other Ambulatory Visit (HOSPITAL_BASED_OUTPATIENT_CLINIC_OR_DEPARTMENT_OTHER): Payer: Medicare Other

## 2013-08-04 DIAGNOSIS — I82409 Acute embolism and thrombosis of unspecified deep veins of unspecified lower extremity: Secondary | ICD-10-CM

## 2013-08-04 DIAGNOSIS — I82403 Acute embolism and thrombosis of unspecified deep veins of lower extremity, bilateral: Secondary | ICD-10-CM

## 2013-08-04 DIAGNOSIS — I82509 Chronic embolism and thrombosis of unspecified deep veins of unspecified lower extremity: Secondary | ICD-10-CM

## 2013-08-04 LAB — PROTIME-INR
INR: 1.5 — ABNORMAL LOW (ref 2.00–3.50)
Protime: 18 Seconds — ABNORMAL HIGH (ref 10.6–13.4)

## 2013-08-04 LAB — POCT INR: INR: 1.5

## 2013-08-04 NOTE — Patient Instructions (Signed)
Continue Coumadin 9mg  daily except 5mg  on Fridays.  Recheck INR in 3 weeks on 08/19/13; Lab at 10:00am and coumadin clinic at 10:15am.

## 2013-08-04 NOTE — Progress Notes (Signed)
Pt seen in clinic today. INR=1.5 after holding dose and resuming her 9mg  daily with 5mg  on Fri This has been her dose for quite a while with flucutations in her INR She states she is on a new BP pill and pain pill.  She did not have names with her and will call us back with them Will adjust dose if there is any interactions found.   Otherwise she will continue on 9mg  daily with 5 mg on Fri. She is planning a trip to [place] to get married to a man in December. She is also anticipating an injection in her back (steriod inj?) Has done these in the past.  I told her to make sure MD is in touch with Dr. Benay Spice regarding her coumadin and when and if she will need to stop prior to injection.  Continue Coumadin 9mg  daily except 5mg  on Fridays.   Recheck INR in 3 weeks on 08/19/13; Lab at 10:00am and coumadin clinic at 10:15am.

## 2013-08-17 ENCOUNTER — Inpatient Hospital Stay (HOSPITAL_COMMUNITY)
Admission: EM | Admit: 2013-08-17 | Discharge: 2013-08-21 | DRG: 191 | Disposition: A | Discharge status: Home / Self Care | Payer: Medicare Other | Attending: Internal Medicine | Admitting: Internal Medicine

## 2013-08-17 ENCOUNTER — Encounter (HOSPITAL_COMMUNITY): Payer: Self-pay | Admitting: Emergency Medicine

## 2013-08-17 ENCOUNTER — Emergency Department (HOSPITAL_COMMUNITY): Payer: Medicare Other

## 2013-08-17 DIAGNOSIS — F3289 Other specified depressive episodes: Secondary | ICD-10-CM | POA: Diagnosis present

## 2013-08-17 DIAGNOSIS — F1410 Cocaine abuse, uncomplicated: Secondary | ICD-10-CM | POA: Diagnosis present

## 2013-08-17 DIAGNOSIS — Z6841 Body Mass Index (BMI) 40.0 and over, adult: Secondary | ICD-10-CM

## 2013-08-17 DIAGNOSIS — Z8249 Family history of ischemic heart disease and other diseases of the circulatory system: Secondary | ICD-10-CM

## 2013-08-17 DIAGNOSIS — I82403 Acute embolism and thrombosis of unspecified deep veins of lower extremity, bilateral: Secondary | ICD-10-CM

## 2013-08-17 DIAGNOSIS — I82509 Chronic embolism and thrombosis of unspecified deep veins of unspecified lower extremity: Secondary | ICD-10-CM | POA: Diagnosis present

## 2013-08-17 DIAGNOSIS — K5900 Constipation, unspecified: Secondary | ICD-10-CM

## 2013-08-17 DIAGNOSIS — R894 Abnormal immunological findings in specimens from other organs, systems and tissues: Secondary | ICD-10-CM | POA: Diagnosis present

## 2013-08-17 DIAGNOSIS — Z9089 Acquired absence of other organs: Secondary | ICD-10-CM

## 2013-08-17 DIAGNOSIS — Z833 Family history of diabetes mellitus: Secondary | ICD-10-CM

## 2013-08-17 DIAGNOSIS — Z7901 Long term (current) use of anticoagulants: Secondary | ICD-10-CM

## 2013-08-17 DIAGNOSIS — F329 Major depressive disorder, single episode, unspecified: Secondary | ICD-10-CM | POA: Diagnosis present

## 2013-08-17 DIAGNOSIS — E669 Obesity, unspecified: Secondary | ICD-10-CM | POA: Diagnosis present

## 2013-08-17 DIAGNOSIS — J441 Chronic obstructive pulmonary disease with (acute) exacerbation: Principal | ICD-10-CM | POA: Diagnosis present

## 2013-08-17 DIAGNOSIS — M5430 Sciatica, unspecified side: Secondary | ICD-10-CM

## 2013-08-17 DIAGNOSIS — Z9981 Dependence on supplemental oxygen: Secondary | ICD-10-CM

## 2013-08-17 DIAGNOSIS — E119 Type 2 diabetes mellitus without complications: Secondary | ICD-10-CM | POA: Diagnosis present

## 2013-08-17 DIAGNOSIS — G4730 Sleep apnea, unspecified: Secondary | ICD-10-CM | POA: Diagnosis present

## 2013-08-17 DIAGNOSIS — R0902 Hypoxemia: Secondary | ICD-10-CM | POA: Diagnosis present

## 2013-08-17 DIAGNOSIS — K219 Gastro-esophageal reflux disease without esophagitis: Secondary | ICD-10-CM | POA: Diagnosis present

## 2013-08-17 DIAGNOSIS — IMO0001 Reserved for inherently not codable concepts without codable children: Secondary | ICD-10-CM | POA: Diagnosis present

## 2013-08-17 DIAGNOSIS — I82409 Acute embolism and thrombosis of unspecified deep veins of unspecified lower extremity: Secondary | ICD-10-CM

## 2013-08-17 DIAGNOSIS — I10 Essential (primary) hypertension: Secondary | ICD-10-CM | POA: Diagnosis present

## 2013-08-17 DIAGNOSIS — D72829 Elevated white blood cell count, unspecified: Secondary | ICD-10-CM | POA: Diagnosis present

## 2013-08-17 DIAGNOSIS — E1169 Type 2 diabetes mellitus with other specified complication: Secondary | ICD-10-CM | POA: Diagnosis present

## 2013-08-17 DIAGNOSIS — Z86718 Personal history of other venous thrombosis and embolism: Secondary | ICD-10-CM

## 2013-08-17 DIAGNOSIS — R109 Unspecified abdominal pain: Secondary | ICD-10-CM

## 2013-08-17 DIAGNOSIS — E876 Hypokalemia: Secondary | ICD-10-CM | POA: Diagnosis present

## 2013-08-17 DIAGNOSIS — D649 Anemia, unspecified: Secondary | ICD-10-CM | POA: Diagnosis present

## 2013-08-17 LAB — CBC WITH DIFFERENTIAL/PLATELET
Basophils Absolute: 0 10*3/uL (ref 0.0–0.1)
Basophils Relative: 0 % (ref 0–1)
Eosinophils Absolute: 0.1 10*3/uL (ref 0.0–0.7)
Eosinophils Relative: 1 % (ref 0–5)
HCT: 32.5 % — ABNORMAL LOW (ref 36.0–46.0)
Hemoglobin: 10.7 g/dL — ABNORMAL LOW (ref 12.0–15.0)
Lymphocytes Relative: 20 % (ref 12–46)
Lymphs Abs: 2.8 10*3/uL (ref 0.7–4.0)
MCH: 26.4 pg (ref 26.0–34.0)
MCHC: 32.9 g/dL (ref 30.0–36.0)
MCV: 80.2 fL (ref 78.0–100.0)
Monocytes Absolute: 1.2 10*3/uL — ABNORMAL HIGH (ref 0.1–1.0)
Monocytes Relative: 9 % (ref 3–12)
Neutro Abs: 10 10*3/uL — ABNORMAL HIGH (ref 1.7–7.7)
Neutrophils Relative %: 70 % (ref 43–77)
Platelets: 324 10*3/uL (ref 150–400)
RBC: 4.05 MIL/uL (ref 3.87–5.11)
RDW: 17.6 % — ABNORMAL HIGH (ref 11.5–15.5)
WBC: 14.2 10*3/uL — ABNORMAL HIGH (ref 4.0–10.5)

## 2013-08-17 LAB — URINALYSIS, ROUTINE W REFLEX MICROSCOPIC
Glucose, UA: NEGATIVE mg/dL
Hgb urine dipstick: NEGATIVE
Ketones, ur: NEGATIVE mg/dL
Leukocytes, UA: NEGATIVE
Nitrite: NEGATIVE
Protein, ur: 100 mg/dL — AB
Specific Gravity, Urine: 1.023 (ref 1.005–1.030)
Urobilinogen, UA: 1 mg/dL (ref 0.0–1.0)
pH: 5.5 (ref 5.0–8.0)

## 2013-08-17 LAB — COMPREHENSIVE METABOLIC PANEL
ALT: 19 U/L (ref 0–35)
AST: 19 U/L (ref 0–37)
Albumin: 3.5 g/dL (ref 3.5–5.2)
Alkaline Phosphatase: 100 U/L (ref 39–117)
BUN: 11 mg/dL (ref 6–23)
CO2: 28 mEq/L (ref 19–32)
Calcium: 9 mg/dL (ref 8.4–10.5)
Chloride: 96 mEq/L (ref 96–112)
Creatinine, Ser: 0.73 mg/dL (ref 0.50–1.10)
GFR calc Af Amer: >90 mL/min (ref >90)
GFR calc non Af Amer: >90 mL/min (ref >90)
Glucose, Bld: 157 mg/dL — ABNORMAL HIGH (ref 70–99)
Potassium: 3.4 mEq/L — ABNORMAL LOW (ref 3.7–5.3)
Sodium: 138 mEq/L (ref 137–147)
Total Bilirubin: 0.5 mg/dL (ref 0.3–1.2)
Total Protein: 8.2 g/dL (ref 6.0–8.3)

## 2013-08-17 LAB — URINE MICROSCOPIC-ADD ON

## 2013-08-17 LAB — PROTIME-INR
INR: 1.17 (ref 0.00–1.49)
Prothrombin Time: 14.7 seconds (ref 11.6–15.2)

## 2013-08-17 LAB — RAPID STREP SCREEN (MED CTR MEBANE ONLY): Streptococcus, Group A Screen (Direct): NEGATIVE

## 2013-08-17 LAB — PRO B NATRIURETIC PEPTIDE: Pro B Natriuretic peptide (BNP): 54.7 pg/mL (ref 0–125)

## 2013-08-17 LAB — TROPONIN I: Troponin I: <0.3 ng/mL (ref <0.30)

## 2013-08-17 MED ORDER — MENTHOL 3 MG MT LOZG
1.0000 | LOZENGE | OROMUCOSAL | Status: DC | PRN
  Filled 2013-08-17: qty 9

## 2013-08-17 MED ORDER — CLONIDINE HCL 0.1 MG PO TABS
0.1000 mg | ORAL_TABLET | Freq: Two times a day (BID) | ORAL | Status: DC
  Administered 2013-08-17 – 2013-08-21 (×9): 0.1 mg via ORAL
  Filled 2013-08-17 (×11): qty 1

## 2013-08-17 MED ORDER — GABAPENTIN 300 MG PO CAPS
300.0000 mg | ORAL_CAPSULE | Freq: Three times a day (TID) | ORAL | Status: DC
  Administered 2013-08-17 – 2013-08-21 (×13): 300 mg via ORAL
  Filled 2013-08-17 (×14): qty 1

## 2013-08-17 MED ORDER — PRAVASTATIN SODIUM 40 MG PO TABS
40.0000 mg | ORAL_TABLET | Freq: Every day | ORAL | Status: DC
  Administered 2013-08-17 – 2013-08-21 (×5): 40 mg via ORAL
  Filled 2013-08-17 (×5): qty 1

## 2013-08-17 MED ORDER — SODIUM CHLORIDE 0.9 % IJ SOLN
3.0000 mL | Freq: Two times a day (BID) | INTRAMUSCULAR | Status: DC
  Administered 2013-08-17 – 2013-08-21 (×8): 3 mL via INTRAVENOUS

## 2013-08-17 MED ORDER — SODIUM CHLORIDE 0.9 % IJ SOLN
3.0000 mL | INTRAMUSCULAR | Status: DC | PRN
  Administered 2013-08-17 – 2013-08-20 (×4): 3 mL via INTRAVENOUS

## 2013-08-17 MED ORDER — BENZONATATE 100 MG PO CAPS
100.0000 mg | ORAL_CAPSULE | Freq: Two times a day (BID) | ORAL | Status: DC | PRN
  Administered 2013-08-17: 100 mg via ORAL
  Filled 2013-08-17: qty 1

## 2013-08-17 MED ORDER — IPRATROPIUM-ALBUTEROL 0.5-2.5 (3) MG/3ML IN SOLN
3.0000 mL | Freq: Four times a day (QID) | RESPIRATORY_TRACT | Status: DC
  Administered 2013-08-17 – 2013-08-18 (×5): 3 mL via RESPIRATORY_TRACT
  Filled 2013-08-17 (×5): qty 3

## 2013-08-17 MED ORDER — DILTIAZEM HCL ER COATED BEADS 180 MG PO CP24
180.0000 mg | ORAL_CAPSULE | Freq: Every day | ORAL | Status: DC
  Administered 2013-08-17 – 2013-08-21 (×5): 180 mg via ORAL
  Filled 2013-08-17 (×5): qty 1

## 2013-08-17 MED ORDER — METHYLPREDNISOLONE SODIUM SUCC 125 MG IJ SOLR
125.0000 mg | INTRAMUSCULAR | Status: AC
  Administered 2013-08-17: 125 mg via INTRAVENOUS
  Filled 2013-08-17: qty 2

## 2013-08-17 MED ORDER — MORPHINE SULFATE 2 MG/ML IJ SOLN
1.0000 mg | INTRAMUSCULAR | Status: DC | PRN
  Administered 2013-08-17 – 2013-08-21 (×5): 1 mg via INTRAVENOUS
  Filled 2013-08-17 (×5): qty 1

## 2013-08-17 MED ORDER — WARFARIN SODIUM 10 MG PO TABS
10.0000 mg | ORAL_TABLET | Freq: Once | ORAL | Status: AC
  Administered 2013-08-17: 10 mg via ORAL
  Filled 2013-08-17 (×2): qty 1

## 2013-08-17 MED ORDER — ALBUTEROL SULFATE (2.5 MG/3ML) 0.083% IN NEBU
2.5000 mg | INHALATION_SOLUTION | Freq: Once | RESPIRATORY_TRACT | Status: AC
  Administered 2013-08-17: 2.5 mg via RESPIRATORY_TRACT
  Filled 2013-08-17: qty 3

## 2013-08-17 MED ORDER — POTASSIUM CHLORIDE CRYS ER 20 MEQ PO TBCR
40.0000 meq | EXTENDED_RELEASE_TABLET | Freq: Once | ORAL | Status: AC
  Administered 2013-08-17: 40 meq via ORAL
  Filled 2013-08-17: qty 2

## 2013-08-17 MED ORDER — ALBUTEROL SULFATE (2.5 MG/3ML) 0.083% IN NEBU
5.0000 mg | INHALATION_SOLUTION | Freq: Once | RESPIRATORY_TRACT | Status: AC
  Administered 2013-08-17: 5 mg via RESPIRATORY_TRACT
  Filled 2013-08-17: qty 6

## 2013-08-17 MED ORDER — ONDANSETRON HCL 4 MG/2ML IJ SOLN: 4.0000 mg | Freq: Three times a day (TID) | INTRAMUSCULAR | Status: AC | PRN

## 2013-08-17 MED ORDER — WARFARIN - PHARMACIST DOSING INPATIENT: Freq: Every day | Status: DC

## 2013-08-17 MED ORDER — LEVOFLOXACIN IN D5W 750 MG/150ML IV SOLN
750.0000 mg | INTRAVENOUS | Status: DC
  Administered 2013-08-17 – 2013-08-18 (×2): 750 mg via INTRAVENOUS
  Filled 2013-08-17 (×2): qty 150

## 2013-08-17 MED ORDER — ALBUTEROL SULFATE (2.5 MG/3ML) 0.083% IN NEBU: 2.5000 mg | INHALATION_SOLUTION | RESPIRATORY_TRACT | Status: DC | PRN

## 2013-08-17 MED ORDER — ENOXAPARIN SODIUM 100 MG/ML ~~LOC~~ SOLN
100.0000 mg | Freq: Once | SUBCUTANEOUS | Status: AC
  Administered 2013-08-17: 100 mg via SUBCUTANEOUS
  Filled 2013-08-17: qty 1

## 2013-08-17 MED ORDER — GUAIFENESIN-DM 100-10 MG/5ML PO SYRP
5.0000 mL | ORAL_SOLUTION | ORAL | Status: DC | PRN
Start: 2013-08-17 — End: 2013-08-21
  Administered 2013-08-17 – 2013-08-20 (×6): 5 mL via ORAL
  Administered 2013-08-20: 10:00:00 via ORAL
  Filled 2013-08-17 (×7): qty 10

## 2013-08-17 MED ORDER — HYDROCODONE-ACETAMINOPHEN 7.5-325 MG PO TABS
1.0000 | ORAL_TABLET | Freq: Four times a day (QID) | ORAL | Status: DC | PRN
Start: 2013-08-17 — End: 2013-08-21
  Administered 2013-08-17 – 2013-08-21 (×11): 1 via ORAL
  Filled 2013-08-17 (×11): qty 1

## 2013-08-17 MED ORDER — DEXTROSE 5 % IV SOLN
500.0000 mg | Freq: Once | INTRAVENOUS | Status: AC
  Administered 2013-08-17: 500 mg via INTRAVENOUS

## 2013-08-17 MED ORDER — BUDESONIDE-FORMOTEROL FUMARATE 80-4.5 MCG/ACT IN AERO
2.0000 | INHALATION_SPRAY | Freq: Two times a day (BID) | RESPIRATORY_TRACT | Status: DC
  Administered 2013-08-17 – 2013-08-21 (×8): 2 via RESPIRATORY_TRACT
  Filled 2013-08-17: qty 6.9

## 2013-08-17 MED ORDER — LORATADINE 10 MG PO TABS
10.0000 mg | ORAL_TABLET | Freq: Every day | ORAL | Status: DC | PRN
  Filled 2013-08-17: qty 1

## 2013-08-17 MED ORDER — ENOXAPARIN SODIUM 150 MG/ML ~~LOC~~ SOLN
1.0000 mg/kg | Freq: Two times a day (BID) | SUBCUTANEOUS | Status: DC
  Administered 2013-08-18 – 2013-08-21 (×8): 130 mg via SUBCUTANEOUS
  Filled 2013-08-17 (×9): qty 1

## 2013-08-17 MED ORDER — DULOXETINE HCL 60 MG PO CPEP
60.0000 mg | ORAL_CAPSULE | Freq: Every day | ORAL | Status: DC
  Administered 2013-08-17 – 2013-08-21 (×5): 60 mg via ORAL
  Filled 2013-08-17 (×5): qty 1

## 2013-08-17 MED ORDER — POLYETHYLENE GLYCOL 3350 17 G PO PACK
17.0000 g | PACK | Freq: Every day | ORAL | Status: DC
  Administered 2013-08-17 – 2013-08-20 (×4): 17 g via ORAL
  Filled 2013-08-17 (×5): qty 1

## 2013-08-17 MED ORDER — PANTOPRAZOLE SODIUM 40 MG PO TBEC
40.0000 mg | DELAYED_RELEASE_TABLET | Freq: Every day | ORAL | Status: DC
  Administered 2013-08-17 – 2013-08-21 (×5): 40 mg via ORAL
  Filled 2013-08-17 (×5): qty 1

## 2013-08-17 MED ORDER — QUETIAPINE FUMARATE 50 MG PO TABS
50.0000 mg | ORAL_TABLET | Freq: Two times a day (BID) | ORAL | Status: DC
  Administered 2013-08-17 – 2013-08-21 (×9): 50 mg via ORAL
  Filled 2013-08-17 (×10): qty 1

## 2013-08-17 MED ORDER — IPRATROPIUM BROMIDE 0.02 % IN SOLN
0.5000 mg | Freq: Once | RESPIRATORY_TRACT | Status: AC
  Administered 2013-08-17: 0.5 mg via RESPIRATORY_TRACT
  Filled 2013-08-17: qty 2.5

## 2013-08-17 MED ORDER — ORPHENADRINE CITRATE ER 100 MG PO TB12
100.0000 mg | ORAL_TABLET | Freq: Two times a day (BID) | ORAL | Status: DC
  Administered 2013-08-17 – 2013-08-21 (×8): 100 mg via ORAL
  Filled 2013-08-17 (×9): qty 1

## 2013-08-17 MED ORDER — IPRATROPIUM-ALBUTEROL 0.5-2.5 (3) MG/3ML IN SOLN: 3.0000 mL | RESPIRATORY_TRACT | Status: DC

## 2013-08-17 MED ORDER — METHYLPREDNISOLONE SODIUM SUCC 125 MG IJ SOLR
60.0000 mg | Freq: Four times a day (QID) | INTRAMUSCULAR | Status: DC
  Administered 2013-08-17 – 2013-08-18 (×4): 60 mg via INTRAVENOUS
  Filled 2013-08-17 (×7): qty 0.96

## 2013-08-17 MED ORDER — AMLODIPINE BESYLATE 5 MG PO TABS
5.0000 mg | ORAL_TABLET | Freq: Every day | ORAL | Status: DC
  Administered 2013-08-18 – 2013-08-20 (×3): 5 mg via ORAL
  Filled 2013-08-17 (×3): qty 1

## 2013-08-17 MED ORDER — ENOXAPARIN SODIUM 150 MG/ML ~~LOC~~ SOLN
1.0000 mg/kg | Freq: Once | SUBCUTANEOUS | Status: AC
  Filled 2013-08-17: qty 1

## 2013-08-17 MED ORDER — ALBUTEROL SULFATE (2.5 MG/3ML) 0.083% IN NEBU
2.5000 mg | INHALATION_SOLUTION | RESPIRATORY_TRACT | Status: DC
  Administered 2013-08-17: 2.5 mg via RESPIRATORY_TRACT
  Filled 2013-08-17: qty 3

## 2013-08-17 MED ORDER — IPRATROPIUM-ALBUTEROL 0.5-2.5 (3) MG/3ML IN SOLN: 3.0000 mL | Freq: Once | RESPIRATORY_TRACT | Status: DC

## 2013-08-17 MED ORDER — SIMVASTATIN 20 MG PO TABS: 20.0000 mg | ORAL_TABLET | Freq: Every day | ORAL | Status: DC

## 2013-08-17 NOTE — Progress Notes (Addendum)
ADM: COPD EX MEDICAL HX: COPD, SOB, SLEEP APNEA, COCAINE ABUSE CHEST X- RAY: 08/17/13 PULMONARY VENOUS CONGESTION/ PERIHILAR ATELECTASIS ASSESSMENT: BS/WH, PT SOB, DYSPNEA HOME REGIMEN: ALBUTEROL INHALER Q4PRN CURRENT THERAPY: MD ORDRER ALBUTEROL Q4, DUONEB Q4, ALBUTEROL Q2PRN, SYMBICORT BID PLAN: DUONEB QID, ALBUTEROL Q2PRN, SYMBICORT BID RT MADE PT DUONEB QID DUE TO ASSESSMENT. PT WAS SEEN IN ED AND RECEIVED MANY NEB TX.

## 2013-08-17 NOTE — Progress Notes (Signed)
Pharmacy Consult Note - Lovenox  A/P: pharmacy already knows patient after dosing her warfarin (see previous consult note). To add Lovenox per Md order until INR is therapeutic. Start Lovenox 130mg  (1mg /kg) SQ q12. Will continue to follow  Adrian Saran, PharmD, BCPS Pager [phone] 08/17/2013 2:33 PM

## 2013-08-17 NOTE — ED Notes (Signed)
Patient states unable to urinate at this time. Pt aware of need for sample.

## 2013-08-17 NOTE — ED Notes (Signed)
Pt still receiving breathing treatments will attempt to obtain UA after treatment is finished

## 2013-08-17 NOTE — Progress Notes (Signed)
ADULT WHEEZE PROTOCOL INITIATED 10:32 ALBUTEROL 5MG /ATROVENT 0.5MG  11:11 ALBUTEROL 2.5 MG 11:32 ALBUTEROL 2.5 MG ADULT WHEEZE PROTOCOL COMPLETE PT SATS 95% ON ROOM AIR, HR 95, RR 22, BS/WH. PT STATES HER BREATHING IS MUCH BETTER, RN AWARE. PT ALSO GOT 10 MG ALBUTEROL/1MG  ATROVENT EN ROUTE TO HOSPITAL.

## 2013-08-17 NOTE — ED Provider Notes (Signed)
CSN: [id number]     Arrival date & time 08/17/13  0941 History   First MD Initiated Contact with Patient 08/17/13 [phone]     No chief complaint on file.    (Consider location/radiation/quality/duration/timing/severity/associated sxs/prior Treatment) HPI Patient presents to EMS in respiratory distress. Patient has had increasing dyspnea over the past days, worse over the past 24 hours. There is anterior chest heaviness, diaphoresis, generalized discomfort. No relief with home medications.  Symptoms are worse with activity. No syncope, vomiting, diarrhea. Patient states that she has taken all of her medication as directed. Per EMS the patient received albuterol therapy in route with minimal change in her condition.  Past Medical History  Diagnosis Date  . DVT (deep venous thrombosis)     Recurrent  . Adrenal mass 03/226    Benign  . Lupus Anticoagulant Positive   . Clotting disorder     +beta-2-glycoprotein IgA antibody  . History of cocaine abuse     Remote history   . Heart murmur   . Hypertension     takes meds daily  . Depression   . Shortness of breath     exertion or lying flat  . 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 Sleep apnea     2l of oxygen at night  . On home oxygen therapy     at night  . Diabetes mellitus     120s usually fasting  . GERD (gastroesophageal reflux disease)   . Dizziness, nonspecific   . Arthritis     knees/multiple orthopedic conditons; lower back  . History of blood transfusion   . Fibromyalgia    Past Surgical History  Procedure Laterality Date  . Carpal tunnel release      Bilateral  . Abdominal hysterectomy  1989    Fibroids  . Back surgery      cervical spine---disk disease  . Cesarean section    . Replacement total knee  11/17/2008    bilateral  . Right elbow surgery    . Tubal ligation    . Gallstones removed    . Anterior lumbar fusion  01/03/2012    Procedure: ANTERIOR  LUMBAR FUSION 1 LEVEL;  Surgeon: Winfield Cunas, MD;  Location: [facility];  Service: Neurosurgery;  Laterality: N/A;  Lumbar Four-Five Anterior Lumbar Interbody Fusion with Instrumentation   Family History  Problem Relation Age of Onset  . Hypertension Father   . Heart failure Father   . Hypertension Mother   . Diabetes Mother   . Cancer Mother     Pancreatic   History  Substance Use Topics  . Smoking status: Never Smoker   . Smokeless tobacco: Never Used  . Alcohol Use: No   OB History   Grav Para Term Preterm Abortions TAB SAB Ect Mult Living                 Review of Systems  Unable to perform ROS: Acuity of condition      Allergies  Corticosteroids  Home Medications   Prior to Admission medications   Medication Sig Start Date End Date Taking? Authorizing Provider  albuterol (PROVENTIL HFA;VENTOLIN HFA) 108 (90 BASE) MCG/ACT inhaler Inhale 2 puffs into the lungs every 4 (four) hours as needed. For shortness of breath    Historical Provider, MD  amLODipine (NORVASC) 5 MG tablet Take 5 mg by mouth daily.     Historical Provider, MD  budesonide-formoterol (SYMBICORT) 80-4.5  MCG/ACT inhaler Inhale 2 puffs into the lungs 2 (two) times daily.     Historical Provider, MD  cloNIDine (CATAPRES) 0.1 MG tablet Take 0.1 mg by mouth 2 (two) times daily.     Historical Provider, MD  diltiazem (CARDIZEM CD) 180 MG 24 hr capsule Take 180 mg by mouth daily.     Historical Provider, MD  DULoxetine (CYMBALTA) 60 MG capsule Take 60 mg by mouth daily.     Historical Provider, MD  gabapentin (NEURONTIN) 300 MG capsule Take 300 mg by mouth Three times a day.  11/24/11   Historical Provider, MD  hydrochlorothiazide (MICROZIDE) 12.5 MG capsule Take 12.5 mg by mouth daily.    Historical Provider, MD  HYDROcodone-acetaminophen (NORCO) 7.5-325 MG per tablet Take 1 tablet by mouth every 6 (six) hours as needed for moderate pain.    Historical Provider, MD  loratadine (CLARITIN) 10 MG tablet Take  10 mg by mouth daily as needed. For allergies.    Historical Provider, MD  metFORMIN (GLUCOPHAGE) 500 MG tablet Take 500 mg by mouth at bedtime.     Historical Provider, MD  omeprazole (PRILOSEC) 20 MG capsule Take 20 mg by mouth 2 (two) times daily.    Historical Provider, MD  orphenadrine (NORFLEX) 100 MG tablet Take 1 tablet (100 mg total) by mouth 2 (two) times daily. 02/17/13   Elisha Headland, NP  oxyCODONE-acetaminophen (PERCOCET/ROXICET) 5-325 MG per tablet Take 1 tablet by mouth every 4 (four) hours as needed for severe pain.    Historical Provider, MD  polyethylene glycol (MIRALAX / GLYCOLAX) packet Take 17 g by mouth daily.     Historical Provider, MD  pravastatin (PRAVACHOL) 40 MG tablet Take 1 tablet by mouth daily. 02/18/12   Historical Provider, MD  QUEtiapine (SEROQUEL) 50 MG tablet Take 50 mg by mouth 2 (two) times daily.     Historical Provider, MD  warfarin (COUMADIN) 4 MG tablet Take 1 tablet (4 mg total) by mouth daily. Take 1 tablet along with warfarin 5mg  tablet daily. 04/29/13   Ladell Pier, MD  warfarin (COUMADIN) 5 MG tablet Take 10-15 mg by mouth daily. 2 tabs (10 mg total) MWFSun; All other days 3 tabs (15 mg total)    Historical Provider, MD   There were no vitals taken for this visit. Physical Exam  Nursing note and vitals reviewed. Constitutional: She is oriented to person, place, and time. She appears distressed.  Morbidly obese female in respiratory distress  HENT:  Head: Normocephalic and atraumatic.  Eyes: Conjunctivae are normal. Right eye exhibits no discharge. Left eye exhibits no discharge.  Neck: No tracheal deviation present.  Cardiovascular: Tachycardia present.   Pulmonary/Chest: Accessory muscle usage present. No stridor. Tachypnea noted. She is in respiratory distress. She has decreased breath sounds. She has wheezes.  Abdominal: She exhibits no distension. There is no tenderness.  Musculoskeletal: She exhibits edema.  Neurological: She is alert and  oriented to person, place, and time. No cranial nerve deficit. Coordination normal.  Skin: Skin is warm. No rash noted. She is diaphoretic.    ED Course  Procedures (including critical care time) Labs Review Labs Reviewed  CBC WITH DIFFERENTIAL - Abnormal; Notable for the following:    WBC 14.2 (*)    Hemoglobin 10.7 (*)    HCT 32.5 (*)    RDW 17.6 (*)    Neutro Abs 10.0 (*)    Monocytes Absolute 1.2 (*)    All other components within normal limits  COMPREHENSIVE METABOLIC  PANEL - Abnormal; Notable for the following:    Potassium 3.4 (*)    Glucose, Bld 157 (*)    All other components within normal limits  TROPONIN I  PRO B NATRIURETIC PEPTIDE  PROTIME-INR  URINALYSIS, ROUTINE W REFLEX MICROSCOPIC    Imaging Review Dg Chest Port 1 View  08/17/2013   CLINICAL DATA:  Extreme shortness of breath, cough, history of heart murmur, COPD, diabetes, hypertension  EXAM: PORTABLE CHEST - 1 VIEW  COMPARISON:  02/05/2012; 12/26/2011; 12/02/2010  FINDINGS: Grossly unchanged enlarged cardiac silhouette and mediastinal contours given decreased lung volumes and patient rotation. There is persistent rightward deviation of the tracheal air column at the level of the aortic arch possibly accentuated due to rotation. Mild pulmonary venous congestion without frank evidence of edema. Worsening peri and infrahilar heterogeneous opacities. No focal airspace opacities. No pleural effusion or pneumothorax. Unchanged bones including lower cervical ACDF.  IMPRESSION: Cardiomegaly, pulmonary venous congestion and perihilar atelectasis without acute cardiopulmonary disease on this hypoventilated AP portable examination. Further evaluation with a PA and lateral chest radiograph may be obtained as clinically indicated.   Electronically Signed   By: Sandi Mariscal M.D.   On: 08/17/2013 11:00     EKG Interpretation   Date/Time:  Sunday Aug 17 2013 09:44:21 EDT Ventricular Rate:  100 PR Interval:  152 QRS Duration:  86 QT Interval:  362 QTC Calculation: 467 R Axis:   32 Text Interpretation:  Sinus tachycardia Ventricular premature complex  Consider left ventricular hypertrophy Borderline T abnormalities, lateral  leads Sinus tachycardia Premature ventricular complexes T wave abnormality  abn Confirmed by Carmin Muskrat  MD [phone]) on 08/17/2013 9:47:46 AM     Patient is on nonrebreather mask, actively receiving therapy in route. Patient's pulse oximetry 96% with supplemental oxygen abnormal  Cardiac monitor shows sinus tachycardia, rate 105, abnormal  After arrival I reviewed the patient's chart the patient's allergy to steroids seems to be abdominal pain, no anaphylaxis, no dyspnea.  She provided Solu-Medrol in addition to being started on additional albuterol therapy.  Update: Patient's breathing is improved, though breath sounds remained coarse, she is no longer using accessory muscles.    11:34 AM Labs notable for leukocytosis.  CXR w no pneumonia Patient remained tachycardic, but sounds still course.  Patient has received 3 albuterol, DuoNeb treatments, with moderate improvement.  Peak flow is 190    MDM   Patient with multiple medical problems presents with respiratory distress.  Initially the patient is diaphoretic, tachypneic, tachycardic, hypoxic.  The patient presents initially here with supplemental oxygen, a bit oral therapy, but with persistent wheezing, diminished breath sounds, she was admitted for further evaluation and management of likely COPD exacerbation.    Carmin Muskrat, MD 08/17/13 1137

## 2013-08-17 NOTE — Progress Notes (Signed)
ANTICOAGULATION CONSULT NOTE - Initial Consult  Pharmacy Consult for Warfarin Indication: Hx recurrent DVTs/Lupus anticoagulant +  Allergies  Allergen Reactions  . Corticosteroids Rash    States she developed a rash on her tongue after a steroid injection in her back    Patient Measurements: Height: 5\' 9"  (175.3 cm) Weight: 281 lb 1.4 oz (127.5 kg) IBW/kg (Calculated) : 66.2   Vital Signs: Temp: 98.9 F (37.2 C) (05/31 1245) Temp src: Oral (05/31 1245) BP: 138/72 mmHg (05/31 1200) Pulse Rate: 99 (05/31 1200)  Labs:  Recent Labs  08/17/13 0953  HGB 10.7*  HCT 32.5*  PLT 324  LABPROT 14.7  INR 1.17  CREATININE 0.73  TROPONINI <0.30    Estimated Creatinine Clearance: 109.8 ml/min (by C-G formula based on Cr of 0.73).   Medical History: Past Medical History  Diagnosis Date  . DVT (deep venous thrombosis)     Recurrent  . Adrenal mass 03/226    Benign  . Lupus Anticoagulant Positive   . Clotting disorder     +beta-2-glycoprotein IgA antibody  . History of cocaine abuse     Remote history   . Heart murmur   . Hypertension     takes meds daily  . Depression   . Shortness of breath     exertion or lying flat  . 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 Sleep apnea     2l of oxygen at night  . On home oxygen therapy     at night  . Diabetes mellitus     120s usually fasting  . GERD (gastroesophageal reflux disease)   . Dizziness, nonspecific   . Arthritis     knees/multiple orthopedic conditons; lower back  . History of blood transfusion   . Fibromyalgia     Medications:  Scheduled:  . albuterol  2.5 mg Nebulization Q4H  . [START ON 08/18/2013] amLODipine  5 mg Oral Daily  . budesonide-formoterol  2 puff Inhalation BID  . cloNIDine  0.1 mg Oral BID  . diltiazem  180 mg Oral Daily  . DULoxetine  60 mg Oral Daily  . enoxaparin  100 mg Subcutaneous Once  . gabapentin  300 mg Oral TID  .  ipratropium-albuterol  3 mL Nebulization Q4H  . levofloxacin (LEVAQUIN) IV  750 mg Intravenous Q24H  . methylPREDNISolone (SOLU-MEDROL) injection  60 mg Intravenous Q6H  . orphenadrine  100 mg Oral BID  . pantoprazole  40 mg Oral Daily  . polyethylene glycol  17 g Oral Daily  . potassium chloride  40 mEq Oral Once  . QUEtiapine  50 mg Oral BID  . simvastatin  20 mg Oral q1800   Infusions:    Assessment: 58 yo c/o SOB admitted with COPD exacerbation on chronic warfarin for hx recurrent DVTs and lupus anticoagulant +.  INR= 1.17 on admission, pt given Lovenox x 1 dose in ED.  HD= 9 mg daily except 5 mg on Fridays.    [place] of Therapy:  INR 2-3    Plan:   Warfarin  10 mg x1 tonight  Daily PT/INR  Education  Dorrene German 08/17/2013,12:48 PM

## 2013-08-17 NOTE — H&P (Signed)
[facility] History and Physical  [name] UXL:[id number] DOB: [birth year] DOA: 08/17/2013  Referring physician: EDP PCP: Philis Fendt, MD   Chief Complaint: SOB and wheezing since Friday.   HPI: [name] is a 58 y.o. female with h/o copd, DVT, diabetes mellitus, depression, hypertension, cocaine abuse, comes for worsening sob since Friday associated with productive cough , mild subjective fevers and chills. On arrival to ED, she was found to be diffusely wheezing, was given IV steroids, continuous nebs and was on NRB. She later on was transitioned to [place] oxygen and referred to [facility] service for admission for copd exacerbation. She denies  Active smoking. Her cxr is negative for pneumonia.     Review of Systems:  Constitutional:  No weight loss, night sweats, chills, fatigue. Subjective fevers.  HEENT:  No headaches, Difficulty swallowing,Tooth/dental problems,Sore throat,  No sneezing, itching, ear ache, nasal congestion, post nasal drip,  Cardio-vascular:  No chest pain, Orthopnea, PND,, anasarca, dizziness, palpitations.  GI:  No heartburn, indigestion, abdominal pain, nausea, vomiting, diarrhea, change in bowel habits, loss of appetite  Resp:  Sob present, associated with wheezing, productive cough.  Skin:  no rash or lesions.  GU:  no dysuria, change in color of urine, no urgency or frequency. No flank pain.  Musculoskeletal:  Pedal edema.   Past Medical History  Diagnosis Date  . DVT (deep venous thrombosis)     Recurrent  . Adrenal mass 03/226    Benign  . Lupus Anticoagulant Positive   . Clotting disorder     +beta-2-glycoprotein IgA antibody  . History of cocaine abuse     Remote history   . Heart murmur   . Hypertension     takes meds daily  . Depression   . Shortness of breath     exertion or lying flat  . 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 Sleep apnea     2l of oxygen at night  . On home  oxygen therapy     at night  . Diabetes mellitus     120s usually fasting  . GERD (gastroesophageal reflux disease)   . Dizziness, nonspecific   . Arthritis     knees/multiple orthopedic conditons; lower back  . History of blood transfusion   . Fibromyalgia    Past Surgical History  Procedure Laterality Date  . Carpal tunnel release      Bilateral  . Abdominal hysterectomy  1989    Fibroids  . Back surgery      cervical spine---disk disease  . Cesarean section    . Replacement total knee  11/17/2008    bilateral  . Right elbow surgery    . Tubal ligation    . Gallstones removed    . Anterior lumbar fusion  01/03/2012    Procedure: ANTERIOR LUMBAR FUSION 1 LEVEL;  Surgeon: Winfield Cunas, MD;  Location: [facility];  Service: Neurosurgery;  Laterality: N/A;  Lumbar Four-Five Anterior Lumbar Interbody Fusion with Instrumentation   Social History:  reports that she has never smoked. She has never used smokeless tobacco. She reports that she does not drink alcohol or use illicit drugs.  Allergies  Allergen Reactions  . Corticosteroids Rash    States she developed a rash on her tongue after a steroid injection in her back    Family History  Problem Relation Age of Onset  . Hypertension Father   . Heart failure Father   .  Hypertension Mother   . Diabetes Mother   . Cancer Mother     Pancreatic     Prior to Admission medications   Medication Sig Start Date End Date Taking? Authorizing Provider  albuterol (PROVENTIL HFA;VENTOLIN HFA) 108 (90 BASE) MCG/ACT inhaler Inhale 2 puffs into the lungs every 4 (four) hours as needed. For shortness of breath    Historical Provider, MD  amLODipine (NORVASC) 5 MG tablet Take 5 mg by mouth daily.     Historical Provider, MD  budesonide-formoterol (SYMBICORT) 80-4.5 MCG/ACT inhaler Inhale 2 puffs into the lungs 2 (two) times daily.     Historical Provider, MD  cloNIDine (CATAPRES) 0.1 MG tablet Take 0.1 mg by mouth 2 (two) times daily.      Historical Provider, MD  diltiazem (CARDIZEM CD) 180 MG 24 hr capsule Take 180 mg by mouth daily.     Historical Provider, MD  DULoxetine (CYMBALTA) 60 MG capsule Take 60 mg by mouth daily.     Historical Provider, MD  gabapentin (NEURONTIN) 300 MG capsule Take 300 mg by mouth Three times a day.  11/24/11   Historical Provider, MD  hydrochlorothiazide (MICROZIDE) 12.5 MG capsule Take 12.5 mg by mouth daily.    Historical Provider, MD  HYDROcodone-acetaminophen (NORCO) 7.5-325 MG per tablet Take 1 tablet by mouth every 6 (six) hours as needed for moderate pain.    Historical Provider, MD  loratadine (CLARITIN) 10 MG tablet Take 10 mg by mouth daily as needed. For allergies.    Historical Provider, MD  metFORMIN (GLUCOPHAGE) 500 MG tablet Take 500 mg by mouth at bedtime.     Historical Provider, MD  omeprazole (PRILOSEC) 20 MG capsule Take 20 mg by mouth 2 (two) times daily.    Historical Provider, MD  orphenadrine (NORFLEX) 100 MG tablet Take 1 tablet (100 mg total) by mouth 2 (two) times daily. 02/17/13   Elisha Headland, NP  oxyCODONE-acetaminophen (PERCOCET/ROXICET) 5-325 MG per tablet Take 1 tablet by mouth every 4 (four) hours as needed for severe pain.    Historical Provider, MD  polyethylene glycol (MIRALAX / GLYCOLAX) packet Take 17 g by mouth daily.     Historical Provider, MD  pravastatin (PRAVACHOL) 40 MG tablet Take 1 tablet by mouth daily. 02/18/12   Historical Provider, MD  QUEtiapine (SEROQUEL) 50 MG tablet Take 50 mg by mouth 2 (two) times daily.     Historical Provider, MD  warfarin (COUMADIN) 4 MG tablet Take 1 tablet (4 mg total) by mouth daily. Take 1 tablet along with warfarin 5mg  tablet daily. 04/29/13   Ladell Pier, MD  warfarin (COUMADIN) 5 MG tablet Take 10-15 mg by mouth daily. 2 tabs (10 mg total) MWFSun; All other days 3 tabs (15 mg total)    Historical Provider, MD   Physical Exam: Filed Vitals:   08/17/13 1245  BP: 148/82  Pulse: 100  Temp: 98.9 F (37.2 C)  Resp: 20     BP 148/82  Pulse 100  Temp(Src) 98.9 F (37.2 C) (Oral)  Resp 20  Ht 5\' 9"  (1.753 m)  Wt 127.5 kg (281 lb 1.4 oz)  BMI 41.49 kg/m2  SpO2 96%  General:  Appears anxious, in mild distress. Eyes: PERRL, normal lids, irises & conjunctiva Neck: no LAD, masses or thyromegaly Cardiovascular: RRR, no m/r/g. 2+ LE edema. Respiratory: bilateral rhonchi and diffuse wheezing.  Abdomen: soft, ntnd Skin: no rash or induration seen on limited exam Musculoskeletal: tender lower extremities.  Psychiatric: grossly normal mood and  affect, speech fluent and appropriate Neurologic: grossly non-focal.          Labs on Admission:  Basic Metabolic Panel:  Recent Labs Lab 08/17/13 0953  NA 138  K 3.4*  CL 96  CO2 28  GLUCOSE 157*  BUN 11  CREATININE 0.73  CALCIUM 9.0   Liver Function Tests:  Recent Labs Lab 08/17/13 0953  AST 19  ALT 19  ALKPHOS 100  BILITOT 0.5  PROT 8.2  ALBUMIN 3.5   No results found for this basename: LIPASE, AMYLASE,  in the last 168 hours No results found for this basename: AMMONIA,  in the last 168 hours CBC:  Recent Labs Lab 08/17/13 0953  WBC 14.2*  NEUTROABS 10.0*  HGB 10.7*  HCT 32.5*  MCV 80.2  PLT 324   Cardiac Enzymes:  Recent Labs Lab 08/17/13 0953  TROPONINI <0.30    BNP (last 3 results)  Recent Labs  08/17/13 0953  PROBNP 54.7   CBG: No results found for this basename: GLUCAP,  in the last 168 hours  Radiological Exams on Admission: Dg Chest Port 1 View  08/17/2013   CLINICAL DATA:  Extreme shortness of breath, cough, history of heart murmur, COPD, diabetes, hypertension  EXAM: PORTABLE CHEST - 1 VIEW  COMPARISON:  02/05/2012; 12/26/2011; 12/02/2010  FINDINGS: Grossly unchanged enlarged cardiac silhouette and mediastinal contours given decreased lung volumes and patient rotation. There is persistent rightward deviation of the tracheal air column at the level of the aortic arch possibly accentuated due to rotation. Mild  pulmonary venous congestion without frank evidence of edema. Worsening peri and infrahilar heterogeneous opacities. No focal airspace opacities. No pleural effusion or pneumothorax. Unchanged bones including lower cervical ACDF.  IMPRESSION: Cardiomegaly, pulmonary venous congestion and perihilar atelectasis without acute cardiopulmonary disease on this hypoventilated AP portable examination. Further evaluation with a PA and lateral chest radiograph may be obtained as clinically indicated.   Electronically Signed   By: Sandi Mariscal M.D.   On: 08/17/2013 11:00    EKG: sinus with PVC'S and t wave abnormalities in the lateral leads.   Assessment/Plan Active Problems:   Hypokalemia   Type II or unspecified type diabetes mellitus without mention of complication, not stated as uncontrolled   HTN (hypertension)   Bilateral deep vein thromboses   COPD exacerbation   COPD exacerbation: - admit to telemetry.  - started IV solumedrol 60 mg q6hrs, scheduled bronchodilators, nebs and oxygen as needed.  - sputum for cultures and IV antibiotics.  - robitussin.   Type 2 DM: HGBA1C ordered - SSI.   Hypertension: - controlled.  - resume home medications.   Leukocytosis: - no fever on arrival.  - monitor.   Anemia: Normocytic.  Anemia panel ordered.   Hypokalemia  Replete as needed.   Bilateral DVT's: Sub therapeutic INR. INR 1.17.  - she reports not missing any doses.  - lovenox for bridging . Coumadin as per pharmacy.    DVT prophylaxis    Code Status: full code.  Family Communication: none at bedside.  Disposition Plan: admit to telemetry.   Time spent: 65 min Hosie Poisson [facility] Pager [phone]  **Disclaimer: This note may have been dictated with voice recognition software. Similar sounding words can inadvertently be transcribed and this note may contain transcription errors which may not have been corrected upon publication of note.**

## 2013-08-17 NOTE — ED Notes (Signed)
Bed: RESB Expected date:  Expected time:  Means of arrival:  Comments: EMS/resp distress 

## 2013-08-17 NOTE — ED Notes (Addendum)
Patient states that her reaction to steroids is abdominal pain ONLY. Patient denies anaphylaxis, respiratory involvement, LOC, angioedema, or other reactions to corticosteroids.

## 2013-08-17 NOTE — ED Notes (Signed)
Pt made aware of need for urine specimen 

## 2013-08-17 NOTE — ED Notes (Signed)
She c/o shortness of breath for past two days; worse today which is no longer responding favorably to her albuterol inhaler.  She is short of breath with audible wheezing; but not dyspneic.  She had received a 10mg  albuterol and 1mg  Atrovent nebulizer treatment en route to hospital.

## 2013-08-18 ENCOUNTER — Telehealth: Payer: Self-pay | Admitting: Pharmacist

## 2013-08-18 LAB — IRON AND TIBC
Iron: 22 ug/dL — ABNORMAL LOW (ref 42–135)
Saturation Ratios: 7 % — ABNORMAL LOW (ref 20–55)
TIBC: 313 ug/dL (ref 250–470)
UIBC: 291 ug/dL (ref 125–400)

## 2013-08-18 LAB — BASIC METABOLIC PANEL
BUN: 21 mg/dL (ref 6–23)
CO2: 24 mEq/L (ref 19–32)
Calcium: 9.4 mg/dL (ref 8.4–10.5)
Chloride: 95 mEq/L — ABNORMAL LOW (ref 96–112)
Creatinine, Ser: 0.77 mg/dL (ref 0.50–1.10)
GFR calc Af Amer: >90 mL/min (ref >90)
GFR calc non Af Amer: >90 mL/min (ref >90)
Glucose, Bld: 277 mg/dL — ABNORMAL HIGH (ref 70–99)
Potassium: 3.9 mEq/L (ref 3.7–5.3)
Sodium: 134 mEq/L — ABNORMAL LOW (ref 137–147)

## 2013-08-18 LAB — EXPECTORATED SPUTUM ASSESSMENT W GRAM STAIN, RFLX TO RESP C

## 2013-08-18 LAB — TSH: TSH: 0.855 u[IU]/mL (ref 0.350–4.500)

## 2013-08-18 LAB — HEMOGLOBIN A1C
Hgb A1c MFr Bld: 6.9 % — ABNORMAL HIGH (ref <5.7)
Mean Plasma Glucose: 151 mg/dL — ABNORMAL HIGH (ref <117)

## 2013-08-18 LAB — CBC
HCT: 33.2 % — ABNORMAL LOW (ref 36.0–46.0)
Hemoglobin: 10.7 g/dL — ABNORMAL LOW (ref 12.0–15.0)
MCH: 26.1 pg (ref 26.0–34.0)
MCHC: 32.2 g/dL (ref 30.0–36.0)
MCV: 81 fL (ref 78.0–100.0)
Platelets: 304 10*3/uL (ref 150–400)
RBC: 4.1 MIL/uL (ref 3.87–5.11)
RDW: 17.9 % — ABNORMAL HIGH (ref 11.5–15.5)
WBC: 14.4 10*3/uL — ABNORMAL HIGH (ref 4.0–10.5)

## 2013-08-18 LAB — PROTIME-INR
INR: 1.31 (ref 0.00–1.49)
Prothrombin Time: 16 seconds — ABNORMAL HIGH (ref 11.6–15.2)

## 2013-08-18 LAB — FERRITIN: Ferritin: 82 ng/mL (ref 10–291)

## 2013-08-18 LAB — RETICULOCYTES
RBC.: 4.1 MIL/uL (ref 3.87–5.11)
Retic Count, Absolute: 77.9 10*3/uL (ref 19.0–186.0)
Retic Ct Pct: 1.9 % (ref 0.4–3.1)

## 2013-08-18 LAB — VITAMIN B12: Vitamin B-12: 318 pg/mL (ref 211–911)

## 2013-08-18 LAB — FOLATE: Folate: >20 ng/mL

## 2013-08-18 MED ORDER — LEVOFLOXACIN 750 MG PO TABS
750.0000 mg | ORAL_TABLET | Freq: Every day | ORAL | Status: DC
  Administered 2013-08-19 – 2013-08-21 (×3): 750 mg via ORAL
  Filled 2013-08-18 (×3): qty 1

## 2013-08-18 MED ORDER — IPRATROPIUM-ALBUTEROL 0.5-2.5 (3) MG/3ML IN SOLN
3.0000 mL | Freq: Three times a day (TID) | RESPIRATORY_TRACT | Status: DC
  Administered 2013-08-19 – 2013-08-21 (×8): 3 mL via RESPIRATORY_TRACT
  Filled 2013-08-18 (×8): qty 3

## 2013-08-18 MED ORDER — ALBUTEROL SULFATE (2.5 MG/3ML) 0.083% IN NEBU: 2.5000 mg | INHALATION_SOLUTION | Freq: Four times a day (QID) | RESPIRATORY_TRACT | Status: DC | PRN

## 2013-08-18 MED ORDER — PREDNISONE 20 MG PO TABS
40.0000 mg | ORAL_TABLET | Freq: Every day | ORAL | Status: AC
  Administered 2013-08-19 – 2013-08-21 (×3): 40 mg via ORAL
  Filled 2013-08-18 (×3): qty 2

## 2013-08-18 MED ORDER — WARFARIN SODIUM 10 MG PO TABS
10.0000 mg | ORAL_TABLET | Freq: Once | ORAL | Status: AC
  Administered 2013-08-18: 10 mg via ORAL
  Filled 2013-08-18: qty 1

## 2013-08-18 NOTE — Progress Notes (Signed)
ANTICOAGULATION CONSULT NOTE - Follow-up Consult  Pharmacy Consult for Warfarin Indication: Hx recurrent DVTs/Lupus anticoagulant +  Allergies  Allergen Reactions  . Corticosteroids Rash    States she developed a rash on her tongue after a steroid injection in her back    Patient Measurements: Height: 5\' 9"  (175.3 cm) Weight: 281 lb 1.4 oz (127.5 kg) IBW/kg (Calculated) : 66.2   Vital Signs: Temp: 97.6 F (36.4 C) (06/01 0515) Temp src: Oral (06/01 0515) BP: 152/90 mmHg (06/01 0515) Pulse Rate: 97 (06/01 0515)  Labs:  Recent Labs  08/17/13 0953 08/18/13 0501  HGB 10.7* 10.7*  HCT 32.5* 33.2*  PLT 324 304  LABPROT 14.7 16.0*  INR 1.17 1.31  CREATININE 0.73 0.77  TROPONINI <0.30  --     Estimated Creatinine Clearance: 109.8 ml/min (by C-G formula based on Cr of 0.77).   Medical History: Past Medical History  Diagnosis Date  . DVT (deep venous thrombosis)     Recurrent  . Adrenal mass 03/226    Benign  . Lupus Anticoagulant Positive   . Clotting disorder     +beta-2-glycoprotein IgA antibody  . History of cocaine abuse     Remote history   . Heart murmur   . Hypertension     takes meds daily  . Depression   . Shortness of breath     exertion or lying flat  . 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 Sleep apnea     2l of oxygen at night  . On home oxygen therapy     at night  . Diabetes mellitus     120s usually fasting  . GERD (gastroesophageal reflux disease)   . Dizziness, nonspecific   . Arthritis     knees/multiple orthopedic conditons; lower back  . History of blood transfusion   . Fibromyalgia     Medications:  Scheduled:  . amLODipine  5 mg Oral Daily  . budesonide-formoterol  2 puff Inhalation BID  . cloNIDine  0.1 mg Oral BID  . diltiazem  180 mg Oral Daily  . DULoxetine  60 mg Oral Daily  . enoxaparin (LOVENOX) injection  1 mg/kg Subcutaneous Q12H  . gabapentin  300 mg Oral TID  .  ipratropium-albuterol  3 mL Nebulization QID  . levofloxacin (LEVAQUIN) IV  750 mg Intravenous Q24H  . methylPREDNISolone (SOLU-MEDROL) injection  60 mg Intravenous Q6H  . orphenadrine  100 mg Oral BID  . pantoprazole  40 mg Oral Daily  . polyethylene glycol  17 g Oral Daily  . pravastatin  40 mg Oral q1800  . QUEtiapine  50 mg Oral BID  . sodium chloride  3 mL Intravenous Q12H  . warfarin  10 mg Oral ONCE-1800  . Warfarin - Pharmacist Dosing Inpatient   Does not apply q1800   Infusions:    Assessment: 58 yo c/o SOB admitted with COPD exacerbation on chronic warfarin for hx recurrent DVTs and lupus anticoagulant +.  INR= 1.17 on admission, pt given Lovenox x 1 dose in ED.  HD= 9 mg daily except 5 mg on Fridays.   6/1: INR = 1.31. Warfarin 10mg  given on 5/31  [place] of Therapy:  INR 2-3    Plan:   Repeat Warfarin  10 mg x1 tonight  Daily PT/INR  Education  Kizzie Furnish, PharmD Pager: [phone] 08/18/2013 8:33 AM

## 2013-08-18 NOTE — Progress Notes (Signed)
CARE MANAGEMENT NOTE 08/18/2013  Patient:  [name], [name]   Account Number:  [id number]  Date Initiated:  08/18/2013  Documentation initiated by:  Olga Coaster  Subjective/Objective Assessment:   ADMITTED WITH COPD     Action/Plan:   CM FOLLOWING FOR DCP   Anticipated DC Date:  08/22/2013   Anticipated DC Plan:  [place]  CM consult          Status of service:  In process, will continue to follow Medicare Important Message given?   (If response is "NO", the following Medicare IM given date fields will be blank)  Per UR Regulation:  Reviewed for med. necessity/level of care/duration of stay  Comments:  6/1/2015Mindi Slicker RN,SBN,MHA [phone]

## 2013-08-18 NOTE — Progress Notes (Signed)
Inpatient Diabetes Program Recommendations  AACE/ADA: New Consensus Statement on Inpatient Glycemic Control (2013)  Target Ranges:  Prepandial:   less than 140 mg/dL      Peak postprandial:   less than 180 mg/dL (1-2 hours)      Critically ill patients:  140 - 180 mg/dL   Reason for Visit: Hyperglycemia  Diabetes history: DM2 Outpatient Diabetes medications: metformin 500 QHS Current orders for Inpatient glycemic control: None Results for KERYL, GHOLSON (MRN [id number]) as of 08/18/2013 16:13  Ref. Range 08/18/2013 05:01  Glucose Latest Range: 70-100 mg/dL 277 (H)  Results for LEASIA, SWANN (MRN [id number]) as of 08/18/2013 16:13  Ref. Range 08/18/2013 05:01  Hemoglobin A1C Latest Range: <5.7 % 6.9 (H)   On Prednisone 40 mg QD. Hyperglycemia with steroids. Needs improved blood sugars.   Inpatient Diabetes Program Recommendations Correction (SSI): Add Novolog moderate tidwc and hs  Note: Will continue to follow while inpatient. Thank you. Lorenda Peck, RD, LDN, CDE Inpatient Diabetes Coordinator [phone]

## 2013-08-18 NOTE — Telephone Encounter (Signed)
Pt called from her hospital bed today to notify us she will not be coming for her lab/Coumadin Clinic appt tomorrow. She was admitted w/ COPD exacerbation. Pt states she is supposed to be discharged tomorrow.  However, case manager's note from today states pts expected discharge is Fri (6/5) w/ home care.  I did not see that note until I was off the phone w/ pt. I gave pt appt for lab/Coumadin clinic at [facility] for 08/25/13 at 9:15 am lab; 9:30 am Coumadin clinic. If we need to coordinate home care monitoring of INR temporarily upon discharge, we can certainly do that if her situation requires that.  Otherwise we'll plan to see her Mon 6/8 at [facility]. Kennith Center, Pharm.D., CPP 08/18/2013@3 :56 PM

## 2013-08-18 NOTE — Progress Notes (Signed)
[facility] PROGRESS NOTE  [name] QMV:[id number] DOB: November 09, 1955 DOA: 08/17/2013 PCP: Philis Fendt, MD Interim summary : [name] is a 58 y.o. female with h/o copd, DVT, diabetes mellitus, depression, hypertension, cocaine abuse, comes for worsening sob since Friday associated with productive cough , mild subjective fevers and chills. On arrival to ED, she was found to be diffusely wheezing, was given IV steroids, continuous nebs and was on NRB. She later on was transitioned to [place] oxygen and referred to [facility] service for admission for copd exacerbation. She denies Active smoking. Her cxr is negative for pneumonia.  Assessment/Plan: Hypokalemia  Type II or unspecified type diabetes mellitus without mention of complication, not stated as uncontrolled  HTN (hypertension)  Bilateral deep vein thromboses  COPD exacerbation   COPD exacerbation:  - admit to telemetry.  - started on  IV solumedrol 60 mg q6hrs, scheduled bronchodilators, nebs and oxygen as needed. Later on transitioned to po steroids.  - sputum for cultures and IV antibiotics.  - robitussin.  Type 2 DM:  HGBA1C pending.  - SSI.  Hypertension:  - controlled.  - resume home medications.  Leukocytosis:  - no fever on arrival.  - monitor.  Anemia:  Normocytic.  Stable.  Anemia panel ordered.  Hypokalemia  Replete as needed.  Bilateral DVT's:  Sub therapeutic INR. INR 1.3.  - she reports not missing any doses.  - lovenox for bridging . Coumadin as per pharmacy.   DVT prophylaxis   Code Status: full code.  Family Communication: none at bedside.  Disposition Plan: admit to telemetry.       Consultants:  none  Procedures:  none  Antibiotics:  Vancomycin 5/31  HPI/Subjective: Feeling better than yesterday.   Objective: Filed Vitals:   08/18/13 1343  BP: 136/83  Pulse: 95  Temp: 97.9 F (36.6 C)  Resp: 20    Intake/Output Summary (Last 24 hours) at 08/18/13 1605 Last data filed at  08/18/13 1230  Gross per 24 hour  Intake   1452 ml  Output    300 ml  Net   1152 ml   Filed Weights   08/17/13 1241  Weight: 127.5 kg (281 lb 1.4 oz)    Exam:   General:  ALERT AFEBRILE comfortable  Cardiovascular: s1s2  Respiratory: ctab, no wheezing heard  Abdomen: soft NT nd bs+  Musculoskeletal: 2+ pedal edema present.   Data Reviewed: Basic Metabolic Panel:  Recent Labs Lab 08/17/13 0953 08/18/13 0501  NA 138 134*  K 3.4* 3.9  CL 96 95*  CO2 28 24  GLUCOSE 157* 277*  BUN 11 21  CREATININE 0.73 0.77  CALCIUM 9.0 9.4   Liver Function Tests:  Recent Labs Lab 08/17/13 0953  AST 19  ALT 19  ALKPHOS 100  BILITOT 0.5  PROT 8.2  ALBUMIN 3.5   No results found for this basename: LIPASE, AMYLASE,  in the last 168 hours No results found for this basename: AMMONIA,  in the last 168 hours CBC:  Recent Labs Lab 08/17/13 0953 08/18/13 0501  WBC 14.2* 14.4*  NEUTROABS 10.0*  --   HGB 10.7* 10.7*  HCT 32.5* 33.2*  MCV 80.2 81.0  PLT 324 304   Cardiac Enzymes:  Recent Labs Lab 08/17/13 0953  TROPONINI <0.30   BNP (last 3 results)  Recent Labs  08/17/13 0953  PROBNP 54.7   CBG: No results found for this basename: GLUCAP,  in the last 168 hours  Recent Results (from the past  240 hour(s))  CULTURE, GROUP A STREP     Status: None   Collection Time    08/17/13  9:42 AM      Result Value Ref Range Status   Specimen Description THROAT   Final   Special Requests NONE   Final   Culture     Final   Value: NO SUSPICIOUS COLONIES, CONTINUING TO HOLD     Performed at [facility]   Report Status PENDING   Incomplete  RAPID STREP SCREEN     Status: None   Collection Time    08/17/13  2:13 PM      Result Value Ref Range Status   Streptococcus, Group A Screen (Direct) NEGATIVE  NEGATIVE Final   Comment: (NOTE)     A Rapid Antigen test may result negative if the antigen level in the     sample is below the detection level of this test.  The FDA has not     cleared this test as a stand-alone test therefore the rapid antigen     negative result has reflexed to a Group A Strep culture.  CULTURE, EXPECTORATED SPUTUM-ASSESSMENT     Status: None   Collection Time    08/18/13 11:10 AM      Result Value Ref Range Status   Specimen Description SPUTUM   Final   Special Requests NONE   Final   Sputum evaluation     Final   Value: THIS SPECIMEN IS ACCEPTABLE. RESPIRATORY CULTURE REPORT TO FOLLOW.   Report Status 08/18/2013 FINAL   Final     Studies: Dg Chest Port 1 View  08/17/2013   CLINICAL DATA:  Extreme shortness of breath, cough, history of heart murmur, COPD, diabetes, hypertension  EXAM: PORTABLE CHEST - 1 VIEW  COMPARISON:  02/05/2012; 12/26/2011; 12/02/2010  FINDINGS: Grossly unchanged enlarged cardiac silhouette and mediastinal contours given decreased lung volumes and patient rotation. There is persistent rightward deviation of the tracheal air column at the level of the aortic arch possibly accentuated due to rotation. Mild pulmonary venous congestion without frank evidence of edema. Worsening peri and infrahilar heterogeneous opacities. No focal airspace opacities. No pleural effusion or pneumothorax. Unchanged bones including lower cervical ACDF.  IMPRESSION: Cardiomegaly, pulmonary venous congestion and perihilar atelectasis without acute cardiopulmonary disease on this hypoventilated AP portable examination. Further evaluation with a PA and lateral chest radiograph may be obtained as clinically indicated.   Electronically Signed   By: Sandi Mariscal M.D.   On: 08/17/2013 11:00    Scheduled Meds: . amLODipine  5 mg Oral Daily  . budesonide-formoterol  2 puff Inhalation BID  . cloNIDine  0.1 mg Oral BID  . diltiazem  180 mg Oral Daily  . DULoxetine  60 mg Oral Daily  . enoxaparin (LOVENOX) injection  1 mg/kg Subcutaneous Q12H  . gabapentin  300 mg Oral TID  . ipratropium-albuterol  3 mL Nebulization QID  . [START ON  08/19/2013] levofloxacin  750 mg Oral Daily  . orphenadrine  100 mg Oral BID  . pantoprazole  40 mg Oral Daily  . polyethylene glycol  17 g Oral Daily  . pravastatin  40 mg Oral q1800  . [START ON 08/19/2013] predniSONE  40 mg Oral QAC breakfast  . QUEtiapine  50 mg Oral BID  . sodium chloride  3 mL Intravenous Q12H  . warfarin  10 mg Oral ONCE-1800  . Warfarin - Pharmacist Dosing Inpatient   Does not apply q1800   Continuous Infusions:  Active Problems:   Hypokalemia   Type II or unspecified type diabetes mellitus without mention of complication, not stated as uncontrolled   HTN (hypertension)   Bilateral deep vein thromboses   COPD exacerbation    Time spent: 35 min   Hosie Poisson  [facility] Pager [phone]. If 7PM-7AM, please contact night-coverage at [email or web address], password [facility] 08/18/2013, 4:05 PM  LOS: 1 day

## 2013-08-19 ENCOUNTER — Other Ambulatory Visit: Payer: Medicare Other

## 2013-08-19 ENCOUNTER — Inpatient Hospital Stay (HOSPITAL_COMMUNITY): Payer: Medicare Other

## 2013-08-19 ENCOUNTER — Ambulatory Visit: Payer: Medicare Other

## 2013-08-19 DIAGNOSIS — K5900 Constipation, unspecified: Secondary | ICD-10-CM | POA: Diagnosis present

## 2013-08-19 LAB — CULTURE, GROUP A STREP

## 2013-08-19 LAB — PROTIME-INR
INR: 1.67 — ABNORMAL HIGH (ref 0.00–1.49)
Prothrombin Time: 19.2 seconds — ABNORMAL HIGH (ref 11.6–15.2)

## 2013-08-19 MED ORDER — CYANOCOBALAMIN 500 MCG PO TABS
500.0000 ug | ORAL_TABLET | Freq: Every day | ORAL | Status: DC
  Administered 2013-08-19 – 2013-08-21 (×3): 500 ug via ORAL
  Filled 2013-08-19 (×3): qty 1

## 2013-08-19 MED ORDER — FLEET ENEMA 7-19 GM/118ML RE ENEM: 1.0000 | ENEMA | Freq: Every day | RECTAL | Status: DC | PRN

## 2013-08-19 MED ORDER — SENNOSIDES-DOCUSATE SODIUM 8.6-50 MG PO TABS
2.0000 | ORAL_TABLET | Freq: Two times a day (BID) | ORAL | Status: DC
  Administered 2013-08-19 – 2013-08-20 (×4): 2 via ORAL
  Filled 2013-08-19 (×7): qty 2

## 2013-08-19 MED ORDER — WARFARIN SODIUM 5 MG PO TABS
5.0000 mg | ORAL_TABLET | Freq: Once | ORAL | Status: AC
  Administered 2013-08-19: 5 mg via ORAL
  Filled 2013-08-19: qty 1

## 2013-08-19 MED ORDER — MAGNESIUM HYDROXIDE 400 MG/5ML PO SUSP
30.0000 mL | Freq: Every day | ORAL | Status: DC
Start: 2013-08-19 — End: 2013-08-21
  Administered 2013-08-19 – 2013-08-20 (×2): 30 mL via ORAL
  Filled 2013-08-19 (×2): qty 30

## 2013-08-19 MED ORDER — FERROUS SULFATE 325 (65 FE) MG PO TABS
325.0000 mg | ORAL_TABLET | Freq: Two times a day (BID) | ORAL | Status: DC
  Administered 2013-08-19 – 2013-08-21 (×5): 325 mg via ORAL
  Filled 2013-08-19 (×6): qty 1

## 2013-08-19 MED ORDER — BISACODYL 10 MG RE SUPP: 10.0000 mg | Freq: Every day | RECTAL | Status: DC | PRN

## 2013-08-19 NOTE — Progress Notes (Signed)
[facility] PROGRESS NOTE  [name] GLO:[id number] DOB: Mar 09, 1956 DOA: 08/17/2013 PCP: Philis Fendt, MD Interim summary : [name] is a 58 y.o. female with h/o copd, DVT, diabetes mellitus, depression, hypertension, cocaine abuse, comes for worsening sob since Friday associated with productive cough , mild subjective fevers and chills. On arrival to ED, she was found to be diffusely wheezing, was given IV steroids, continuous nebs and was on NRB. She later on was transitioned to [name] oxygen and referred to [facility] service for admission for copd exacerbation. She denies Active smoking. Her cxr is negative for pneumonia.she was started on IV steroids. Her wheezing has improved and we have transitioned to po prednisone. Today she complained about right lower quadrant pain. Her last BM was one week ago on last Wednesday. We obtained a abdominal film showing moderate stool and gas throughout the colon. Started her on stool softeners and laxatives, for constipation. Possible discharge in am.   Assessment/Plan: Hypokalemia  Type II or unspecified type diabetes mellitus without mention of complication, not stated as uncontrolled  HTN (hypertension)  Bilateral deep vein thromboses  COPD exacerbation   COPD exacerbation:  - admit to telemetry.  - started on  IV solumedrol 60 mg q6hrs, scheduled bronchodilators, nebs and oxygen as needed. Later on transitioned to po steroids.  - sputum for cultures and IV antibiotics.  - robitussin.  Type 2 DM:  HGBA1C is 6.9 - CBG (last 3)  No results found for this basename: GLUCAP,  in the last 72 hours    Hypertension:  - controlled.  - resume home medications.   Leukocytosis:  - no fever on arrival.  - monitor.   Anemia:  Normocytic.  Stable.  Anemia panel ordered.   Hypokalemia  Replete as needed.   Bilateral DVT's:  Sub therapeutic INR. INR 1.67   - she reports not missing any doses.  - lovenox for bridging . Coumadin as per pharmacy.     Constipation:  - laxatives and stool softeners.   DVT prophylaxis   Code Status: full code.  Family Communication: FAMILY  at bedside.  Disposition Plan: pending PT EVAL.       Consultants:  none  Procedures:  none  Antibiotics:  Vancomycin 5/31  HPI/Subjective: Feeling better than yesterday.  constipated. No bm since Wednesday.  Objective: Filed Vitals:   08/19/13 1241  BP: 158/81  Pulse: 87  Temp: 97.6 F (36.4 C)  Resp: 20    Intake/Output Summary (Last 24 hours) at 08/19/13 1541 Last data filed at 08/19/13 1242  Gross per 24 hour  Intake    483 ml  Output   1400 ml  Net   -917 ml   Filed Weights   08/17/13 1241  Weight: 127.5 kg (281 lb 1.4 oz)    Exam:   General:  ALERT AFEBRILE comfortable  Cardiovascular: s1s2  Respiratory: ctab, no wheezing heard  Abdomen: soft mild tenderness on the RLQ. No rebound tenderness, bs+  Musculoskeletal: 2+ pedal edema present.   Data Reviewed: Basic Metabolic Panel:  Recent Labs Lab 08/17/13 0953 08/18/13 0501  NA 138 134*  K 3.4* 3.9  CL 96 95*  CO2 28 24  GLUCOSE 157* 277*  BUN 11 21  CREATININE 0.73 0.77  CALCIUM 9.0 9.4   Liver Function Tests:  Recent Labs Lab 08/17/13 0953  AST 19  ALT 19  ALKPHOS 100  BILITOT 0.5  PROT 8.2  ALBUMIN 3.5   No results found for this basename:  LIPASE, AMYLASE,  in the last 168 hours No results found for this basename: AMMONIA,  in the last 168 hours CBC:  Recent Labs Lab 08/17/13 0953 08/18/13 0501  WBC 14.2* 14.4*  NEUTROABS 10.0*  --   HGB 10.7* 10.7*  HCT 32.5* 33.2*  MCV 80.2 81.0  PLT 324 304   Cardiac Enzymes:  Recent Labs Lab 08/17/13 0953  TROPONINI <0.30   BNP (last 3 results)  Recent Labs  08/17/13 0953  PROBNP 54.7   CBG: No results found for this basename: GLUCAP,  in the last 168 hours  Recent Results (from the past 240 hour(s))  CULTURE, GROUP A STREP     Status: None   Collection Time    08/17/13   9:42 AM      Result Value Ref Range Status   Specimen Description THROAT   Final   Special Requests NONE   Final   Culture     Final   Value: No Beta Hemolytic Streptococci Isolated     Performed at [facility]   Report Status 08/19/2013 FINAL   Final  RAPID STREP SCREEN     Status: None   Collection Time    08/17/13  2:13 PM      Result Value Ref Range Status   Streptococcus, Group A Screen (Direct) NEGATIVE  NEGATIVE Final   Comment: (NOTE)     A Rapid Antigen test may result negative if the antigen level in the     sample is below the detection level of this test. The FDA has not     cleared this test as a stand-alone test therefore the rapid antigen     negative result has reflexed to a Group A Strep culture.  CULTURE, EXPECTORATED SPUTUM-ASSESSMENT     Status: None   Collection Time    08/18/13 11:10 AM      Result Value Ref Range Status   Specimen Description SPUTUM   Final   Special Requests NONE   Final   Sputum evaluation     Final   Value: THIS SPECIMEN IS ACCEPTABLE. RESPIRATORY CULTURE REPORT TO FOLLOW.   Report Status 08/18/2013 FINAL   Final  CULTURE, RESPIRATORY (NON-EXPECTORATED)     Status: None   Collection Time    08/18/13 11:10 AM      Result Value Ref Range Status   Specimen Description SPUTUM   Final   Special Requests NONE   Final   Gram Stain     Final   Value: ABUNDANT WBC PRESENT,BOTH PMN AND MONONUCLEAR     RARE SQUAMOUS EPITHELIAL CELLS PRESENT     MODERATE GRAM POSITIVE COCCI IN PAIRS     RARE GRAM POSITIVE RODS     Performed at [facility]   Culture     Final   Value: NORMAL OROPHARYNGEAL FLORA     Performed at [facility]   Report Status PENDING   Incomplete     Studies: Dg Abd 2 Views  08/19/2013   CLINICAL DATA:  Right lower abdominal pain.  History of gallstones.  EXAM: ABDOMEN - 2 VIEW  COMPARISON:  02/02/2012.  Chest x-ray 08/17/2013  FINDINGS: Moderate stool throughout the colon with mild gaseous distention.  No evidence of bowel obstruction. No free air organomegaly. Prior cholecystectomy. Calcified phleboliths in the anatomic pelvis. No acute bony abnormality.  IMPRESSION: Moderate gas and stool throughout the colon. No evidence of bowel obstruction or free air.   Electronically Signed  By: Rolm Baptise M.D.   On: 08/19/2013 14:50    Scheduled Meds: . amLODipine  5 mg Oral Daily  . budesonide-formoterol  2 puff Inhalation BID  . cloNIDine  0.1 mg Oral BID  . vitamin B-12  500 mcg Oral Daily  . diltiazem  180 mg Oral Daily  . DULoxetine  60 mg Oral Daily  . enoxaparin (LOVENOX) injection  1 mg/kg Subcutaneous Q12H  . ferrous sulfate  325 mg Oral BID WC  . gabapentin  300 mg Oral TID  . ipratropium-albuterol  3 mL Nebulization TID  . levofloxacin  750 mg Oral Daily  . magnesium hydroxide  30 mL Oral Daily  . orphenadrine  100 mg Oral BID  . pantoprazole  40 mg Oral Daily  . polyethylene glycol  17 g Oral Daily  . pravastatin  40 mg Oral q1800  . predniSONE  40 mg Oral QAC breakfast  . QUEtiapine  50 mg Oral BID  . senna-docusate  2 tablet Oral BID  . sodium chloride  3 mL Intravenous Q12H  . warfarin  5 mg Oral ONCE-1800  . Warfarin - Pharmacist Dosing Inpatient   Does not apply q1800   Continuous Infusions:   Active Problems:   Hypokalemia   Type II or unspecified type diabetes mellitus without mention of complication, not stated as uncontrolled   HTN (hypertension)   Bilateral deep vein thromboses   COPD exacerbation    Time spent: 35 min   Hosie Poisson  [facility] Pager [phone]. If 7PM-7AM, please contact night-coverage at [email or web address], password [facility] 08/19/2013, 3:41 PM  LOS: 2 days

## 2013-08-19 NOTE — Progress Notes (Signed)
ANTICOAGULATION CONSULT NOTE - Follow-up Consult  Pharmacy Consult for Warfarin Indication: Hx recurrent DVTs/Lupus anticoagulant +  Allergies  Allergen Reactions  . Corticosteroids Rash    States she developed a rash on her tongue after a steroid injection in her back    Patient Measurements: Height: 5\' 9"  (175.3 cm) Weight: 281 lb 1.4 oz (127.5 kg) IBW/kg (Calculated) : 66.2   Vital Signs: Temp: 97.5 F (36.4 C) (06/02 0554) Temp src: Oral (06/02 0554) BP: 137/84 mmHg (06/02 0554) Pulse Rate: 81 (06/02 0554)  Labs:  Recent Labs  08/17/13 0953 08/18/13 0501 08/19/13 0432  HGB 10.7* 10.7*  --   HCT 32.5* 33.2*  --   PLT 324 304  --   LABPROT 14.7 16.0* 19.2*  INR 1.17 1.31 1.67*  CREATININE 0.73 0.77  --   TROPONINI <0.30  --   --     Estimated Creatinine Clearance: 109.8 ml/min (by C-G formula based on Cr of 0.77).   Medical History: Past Medical History  Diagnosis Date  . DVT (deep venous thrombosis)     Recurrent  . Adrenal mass 03/226    Benign  . Lupus Anticoagulant Positive   . Clotting disorder     +beta-2-glycoprotein IgA antibody  . History of cocaine abuse     Remote history   . Heart murmur   . Hypertension     takes meds daily  . Depression   . Shortness of breath     exertion or lying flat  . 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 Sleep apnea     2l of oxygen at night  . On home oxygen therapy     at night  . Diabetes mellitus     120s usually fasting  . GERD (gastroesophageal reflux disease)   . Dizziness, nonspecific   . Arthritis     knees/multiple orthopedic conditons; lower back  . History of blood transfusion   . Fibromyalgia     Medications:  Scheduled:  . amLODipine  5 mg Oral Daily  . budesonide-formoterol  2 puff Inhalation BID  . cloNIDine  0.1 mg Oral BID  . diltiazem  180 mg Oral Daily  . DULoxetine  60 mg Oral Daily  . enoxaparin (LOVENOX) injection  1 mg/kg  Subcutaneous Q12H  . gabapentin  300 mg Oral TID  . ipratropium-albuterol  3 mL Nebulization TID  . levofloxacin  750 mg Oral Daily  . orphenadrine  100 mg Oral BID  . pantoprazole  40 mg Oral Daily  . polyethylene glycol  17 g Oral Daily  . pravastatin  40 mg Oral q1800  . predniSONE  40 mg Oral QAC breakfast  . QUEtiapine  50 mg Oral BID  . sodium chloride  3 mL Intravenous Q12H  . warfarin  5 mg Oral ONCE-1800  . Warfarin - Pharmacist Dosing Inpatient   Does not apply q1800   Infusions:    Assessment: 58 yo c/o SOB admitted with COPD exacerbation on chronic warfarin for hx recurrent DVTs and lupus anticoagulant +.  INR= 1.17 on admission, pt given Lovenox x 1 dose in ED.  HD= 9 mg daily except 5 mg on Fridays.   6/1: INR = 1.31. Warfarin 10mg  given on 5/31 6/2: INR = 1. 67 Warfarin 10mg  given on 6/1  Drug Interactions: Levaquin 750mg  daily may increase anticoagulant effect of warfarin.  [place]ady of Therapy:  INR 2-3    Plan:  Give Warfarin 5mg  dose x1 tonight  Daily PT/INR  Education  Kizzie Furnish, PharmD Pager: [phone] 08/19/2013 8:25 AM

## 2013-08-20 DIAGNOSIS — Z6841 Body Mass Index (BMI) 40.0 and over, adult: Secondary | ICD-10-CM

## 2013-08-20 DIAGNOSIS — I517 Cardiomegaly: Secondary | ICD-10-CM

## 2013-08-20 DIAGNOSIS — E119 Type 2 diabetes mellitus without complications: Secondary | ICD-10-CM

## 2013-08-20 DIAGNOSIS — K5900 Constipation, unspecified: Secondary | ICD-10-CM

## 2013-08-20 LAB — CULTURE, RESPIRATORY W GRAM STAIN: Culture: NORMAL

## 2013-08-20 LAB — BASIC METABOLIC PANEL
BUN: 24 mg/dL — ABNORMAL HIGH (ref 6–23)
CO2: 29 mEq/L (ref 19–32)
Calcium: 9.5 mg/dL (ref 8.4–10.5)
Chloride: 96 mEq/L (ref 96–112)
Creatinine, Ser: 0.76 mg/dL (ref 0.50–1.10)
GFR calc Af Amer: >90 mL/min (ref >90)
GFR calc non Af Amer: >90 mL/min (ref >90)
Glucose, Bld: 166 mg/dL — ABNORMAL HIGH (ref 70–99)
Potassium: 4.3 mEq/L (ref 3.7–5.3)
Sodium: 136 mEq/L — ABNORMAL LOW (ref 137–147)

## 2013-08-20 LAB — CBC
HCT: 34.5 % — ABNORMAL LOW (ref 36.0–46.0)
Hemoglobin: 11.1 g/dL — ABNORMAL LOW (ref 12.0–15.0)
MCH: 26.2 pg (ref 26.0–34.0)
MCHC: 32.2 g/dL (ref 30.0–36.0)
MCV: 81.6 fL (ref 78.0–100.0)
Platelets: 374 10*3/uL (ref 150–400)
RBC: 4.23 MIL/uL (ref 3.87–5.11)
RDW: 18.2 % — ABNORMAL HIGH (ref 11.5–15.5)
WBC: 12.7 10*3/uL — ABNORMAL HIGH (ref 4.0–10.5)

## 2013-08-20 LAB — PROTIME-INR
INR: 1.7 — ABNORMAL HIGH (ref 0.00–1.49)
Prothrombin Time: 19.5 seconds — ABNORMAL HIGH (ref 11.6–15.2)

## 2013-08-20 MED ORDER — LISINOPRIL 5 MG PO TABS
5.0000 mg | ORAL_TABLET | Freq: Every day | ORAL | Status: DC
  Administered 2013-08-20 – 2013-08-21 (×2): 5 mg via ORAL
  Filled 2013-08-20 (×2): qty 1

## 2013-08-20 MED ORDER — WARFARIN SODIUM 7.5 MG PO TABS
7.5000 mg | ORAL_TABLET | Freq: Once | ORAL | Status: AC
  Administered 2013-08-20: 7.5 mg via ORAL
  Filled 2013-08-20: qty 1

## 2013-08-20 MED ORDER — MAGNESIUM CITRATE PO SOLN
1.0000 | Freq: Once | ORAL | Status: AC
  Administered 2013-08-20: 1 via ORAL

## 2013-08-20 NOTE — Progress Notes (Signed)
SATURATION QUALIFICATIONS: (This note is used to comply with regulatory documentation for home oxygen)  Patient Saturations on Room Air at Rest = 100%  Patient Saturations on Room Air while Ambulating = 98%  Patient Saturations on zero Liters of oxygen while Ambulating = 98%  Please briefly explain why patient needs home oxygen:

## 2013-08-20 NOTE — Progress Notes (Signed)
  Echocardiogram 2D Echocardiogram has been performed.  [name] 08/20/2013, 3:14 PM

## 2013-08-20 NOTE — Progress Notes (Signed)
[place] TEAM 1 - Stepdown/ICU TEAM Progress Note  [name] PJK:[id number] DOB: 03-Oct-1955 DOA: 08/17/2013 PCP: Philis Fendt, MD  Admit HPI / Brief Narrative: 58 y.o. BF PMHx  copd, DVT, diabetes mellitus, depression, hypertension, cocaine abuse, comes for worsening sob since Friday associated with productive cough , mild subjective fevers and chills. On arrival to ED, she was found to be diffusely wheezing, was given IV steroids, continuous nebs and was on NRB. She later on was transitioned to [place] oxygen and referred to [facility] service for admission for copd exacerbation. She denies Active smoking. Her cxr is negative for pneumonia.she was started on IV steroids. Her wheezing has improved and we have transitioned to po prednisone. Today she complained about right lower quadrant pain. Her last BM was one week ago on last Wednesday. We obtained a abdominal film showing moderate stool and gas throughout the colon. Started her on stool softeners and laxatives, for constipation. Possible discharge in am.    HPI/Subjective: 6/3 patient still positive SOB, speaking only in short sentences.  Assessment/Plan: COPD exacerbation:  - Continue scheduled bronchodilators, nebs and oxygen as needed. -Patient's complete seven-day course of levofloxacin  -Continue robitussin DM.  -Flutter valve -Patient has been afebrile however continues to have leukocytosis (possibly secondary to her steroid dose), however continues to feel SOB. Will observe one more night and if stable discharge in the morning after echocardiogram completed -6/3 per ambulatory SpO2 patient does not qualify for home O2; see results below  CHF? -Echocardiogram pending  Hypertension:  -  not within ADA/AHA guidelines controlled.  - resume home medications. -Start lisinopril 5 mg daily for renal protection as well as HTN (patient diabetic) - DC Norvasc  -Continue clonidine 0.1 mg BID (PCP to titrate off and replace with blocker for  cardioprotection) -Continue diltiazem 180 mg daily  Leukocytosis:  -  improving with antibiotics and hydration   -  6/3 level now may be secondary to steroid dose check CBC with differential in the a.m.    Anemia:  Normocytic. Stable.  Anemia panel ordered.  Hypokalemia  Replete as needed.   Hypokalemia  -Resolved   Type II or unspecified type diabetes mellitus without mention of complication, not stated as uncontrolled -6/1 hemoglobin A1c= 6.9  Bilateral deep vein thromboses  -On chronic anticoagulation currently not therapeutic  -Continue Coumadin per pharmacy  -Continue Lovenox to bridge patient until therapeutic INR    Constipation:  - laxatives and stool softeners.  -6/3 add magnesium citrate to regimen   Code Status: FULL Family Communication: no family present at time of exam Disposition Plan:     Consultants: NA  Procedure/Significant Events: 5/31 PCXR;Cardiomegaly, pulmonary venous congestion; perihilar atelectasis w/o acute cardiopulmonary disease  6/2 DG abdomen;Moderate gas/stool throughout the colon. (-) bowel obstruction or free air. 6/3 SATURATION QUALIFICATIONS: (This note is used to comply with regulatory documentation for home oxygen)  Patient Saturations on Room Air at Rest = 100%  Patient Saturations on Room Air while Ambulating = 98%  Patient Saturations on zero Liters of oxygen while Ambulating = 98%     Culture 6/1 sputum culture; negative 6/2 throat negative  Antibiotics: Levofloxacin 6/2>>  DVT prophylaxis: Full dose Lovenox, full dose Coumadin   Devices NA   LINES / TUBES:  5/31 20ga right antecubital    Continuous Infusions:   Objective: VITAL SIGNS: Temp: 97.6 F (36.4 C) (06/03 0501) Temp src: Oral (06/03 0501) BP: 150/80 mmHg (06/03 0501) Pulse Rate: 74 (06/03 0501) SPO2; FIO2:  Intake/Output Summary (Last 24 hours) at 08/20/13 1210 Last data filed at 08/20/13 0846  Gross per 24 hour  Intake    320 ml    Output   2250 ml  Net  -1930 ml     Exam: General: A./O. x4, NAD, No acute respiratory distress Lungs: Diffuse expiratory wheezing, negative crackles  Cardiovascular: Regular rate and rhythm without murmur gallop or rub normal S1 and S2 Abdomen: Nontender, nondistended, soft, bowel sounds positive, no rebound, no ascites, no appreciable mass Extremities: No significant cyanosis, clubbing, or edema bilateral lower extremities  Data Reviewed: Basic Metabolic Panel:  Recent Labs Lab 08/17/13 0953 08/18/13 0501 08/20/13 0451  NA 138 134* 136*  K 3.4* 3.9 4.3  CL 96 95* 96  CO2 28 24 29   GLUCOSE 157* 277* 166*  BUN 11 21 24*  CREATININE 0.73 0.77 0.76  CALCIUM 9.0 9.4 9.5   Liver Function Tests:  Recent Labs Lab 08/17/13 0953  AST 19  ALT 19  ALKPHOS 100  BILITOT 0.5  PROT 8.2  ALBUMIN 3.5   No results found for this basename: LIPASE, AMYLASE,  in the last 168 hours No results found for this basename: AMMONIA,  in the last 168 hours CBC:  Recent Labs Lab 08/17/13 0953 08/18/13 0501 08/20/13 0451  WBC 14.2* 14.4* 12.7*  NEUTROABS 10.0*  --   --   HGB 10.7* 10.7* 11.1*  HCT 32.5* 33.2* 34.5*  MCV 80.2 81.0 81.6  PLT 324 304 374   Cardiac Enzymes:  Recent Labs Lab 08/17/13 0953  TROPONINI <0.30   BNP (last 3 results)  Recent Labs  08/17/13 0953  PROBNP 54.7   CBG: No results found for this basename: GLUCAP,  in the last 168 hours  Recent Results (from the past 240 hour(s))  CULTURE, GROUP A STREP     Status: None   Collection Time    08/17/13  9:42 AM      Result Value Ref Range Status   Specimen Description THROAT   Final   Special Requests NONE   Final   Culture     Final   Value: No Beta Hemolytic Streptococci Isolated     Performed at [facility]   Report Status 08/19/2013 FINAL   Final  RAPID STREP SCREEN     Status: None   Collection Time    08/17/13  2:13 PM      Result Value Ref Range Status   Streptococcus, Group A  Screen (Direct) NEGATIVE  NEGATIVE Final   Comment: (NOTE)     A Rapid Antigen test may result negative if the antigen level in the     sample is below the detection level of this test. The FDA has not     cleared this test as a stand-alone test therefore the rapid antigen     negative result has reflexed to a Group A Strep culture.  CULTURE, EXPECTORATED SPUTUM-ASSESSMENT     Status: None   Collection Time    08/18/13 11:10 AM      Result Value Ref Range Status   Specimen Description SPUTUM   Final   Special Requests NONE   Final   Sputum evaluation     Final   Value: THIS SPECIMEN IS ACCEPTABLE. RESPIRATORY CULTURE REPORT TO FOLLOW.   Report Status 08/18/2013 FINAL   Final  CULTURE, RESPIRATORY (NON-EXPECTORATED)     Status: None   Collection Time    08/18/13 11:10 AM  Result Value Ref Range Status   Specimen Description SPUTUM   Final   Special Requests NONE   Final   Gram Stain     Final   Value: ABUNDANT WBC PRESENT,BOTH PMN AND MONONUCLEAR     RARE SQUAMOUS EPITHELIAL CELLS PRESENT     MODERATE GRAM POSITIVE COCCI IN PAIRS     RARE GRAM POSITIVE RODS     Performed at [facility]   Culture     Final   Value: NORMAL OROPHARYNGEAL FLORA     Performed at [facility]   Report Status 08/20/2013 FINAL   Final     Studies:  Recent x-ray studies have been reviewed in detail by the Attending Physician  Scheduled Meds:  Scheduled Meds: . amLODipine  5 mg Oral Daily  . budesonide-formoterol  2 puff Inhalation BID  . cloNIDine  0.1 mg Oral BID  . vitamin B-12  500 mcg Oral Daily  . diltiazem  180 mg Oral Daily  . DULoxetine  60 mg Oral Daily  . enoxaparin (LOVENOX) injection  1 mg/kg Subcutaneous Q12H  . ferrous sulfate  325 mg Oral BID WC  . gabapentin  300 mg Oral TID  . ipratropium-albuterol  3 mL Nebulization TID  . levofloxacin  750 mg Oral Daily  . magnesium hydroxide  30 mL Oral Daily  . orphenadrine  100 mg Oral BID  . pantoprazole  40 mg  Oral Daily  . polyethylene glycol  17 g Oral Daily  . pravastatin  40 mg Oral q1800  . predniSONE  40 mg Oral QAC breakfast  . QUEtiapine  50 mg Oral BID  . senna-docusate  2 tablet Oral BID  . sodium chloride  3 mL Intravenous Q12H  . warfarin  7.5 mg Oral ONCE-1800  . Warfarin - Pharmacist Dosing Inpatient   Does not apply q1800    Time spent on care of this patient: 40 mins   Allie Bossier , MD   [facility] Office  [phone] Pager [phone]  On-Call/Text Page:      [email or web address]      password [email or web address]  If 7PM-7AM, please contact night-coverage [email or web address] Password [email or web address] 08/20/2013, 12:10 PM   LOS: 3 days

## 2013-08-20 NOTE — Progress Notes (Signed)
ANTICOAGULATION CONSULT NOTE - Follow-up Consult  Pharmacy Consult for Warfarin Indication: Hx recurrent DVTs/Lupus anticoagulant +  Allergies  Allergen Reactions  . Corticosteroids Rash    States she developed a rash on her tongue after a steroid injection in her back    Patient Measurements: Height: 5\' 9"  (175.3 cm) Weight: 281 lb 1.4 oz (127.5 kg) IBW/kg (Calculated) : 66.2   Vital Signs: Temp: 97.6 F (36.4 C) (06/03 0501) Temp src: Oral (06/03 0501) BP: 150/80 mmHg (06/03 0501) Pulse Rate: 74 (06/03 0501)  Labs:  Recent Labs  08/18/13 0501 08/19/13 0432 08/20/13 0451  HGB 10.7*  --  11.1*  HCT 33.2*  --  34.5*  PLT 304  --  374  LABPROT 16.0* 19.2* 19.5*  INR 1.31 1.67* 1.70*  CREATININE 0.77  --  0.76    Estimated Creatinine Clearance: 109.8 ml/min (by C-G formula based on Cr of 0.76).   Medical History: Past Medical History  Diagnosis Date  . DVT (deep venous thrombosis)     Recurrent  . Adrenal mass 03/226    Benign  . Lupus Anticoagulant Positive   . Clotting disorder     +beta-2-glycoprotein IgA antibody  . History of cocaine abuse     Remote history   . Heart murmur   . Hypertension     takes meds daily  . Depression   . Shortness of breath     exertion or lying flat  . 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 Sleep apnea     2l of oxygen at night  . On home oxygen therapy     at night  . Diabetes mellitus     120s usually fasting  . GERD (gastroesophageal reflux disease)   . Dizziness, nonspecific   . Arthritis     knees/multiple orthopedic conditons; lower back  . History of blood transfusion   . Fibromyalgia     Medications:  Scheduled:  . amLODipine  5 mg Oral Daily  . budesonide-formoterol  2 puff Inhalation BID  . cloNIDine  0.1 mg Oral BID  . vitamin B-12  500 mcg Oral Daily  . diltiazem  180 mg Oral Daily  . DULoxetine  60 mg Oral Daily  . enoxaparin (LOVENOX) injection   1 mg/kg Subcutaneous Q12H  . ferrous sulfate  325 mg Oral BID WC  . gabapentin  300 mg Oral TID  . ipratropium-albuterol  3 mL Nebulization TID  . levofloxacin  750 mg Oral Daily  . magnesium hydroxide  30 mL Oral Daily  . orphenadrine  100 mg Oral BID  . pantoprazole  40 mg Oral Daily  . polyethylene glycol  17 g Oral Daily  . pravastatin  40 mg Oral q1800  . predniSONE  40 mg Oral QAC breakfast  . QUEtiapine  50 mg Oral BID  . senna-docusate  2 tablet Oral BID  . sodium chloride  3 mL Intravenous Q12H  . warfarin  7.5 mg Oral ONCE-1800  . Warfarin - Pharmacist Dosing Inpatient   Does not apply q1800   Infusions:    Assessment: 58 yo c/o SOB admitted with COPD exacerbation on chronic warfarin for hx recurrent DVTs and lupus anticoagulant +.  INR= 1.17 on admission, pt given Lovenox x 1 dose in ED.  HD= 9 mg daily except 5 mg on Fridays.   6/1: INR = 1.31. Warfarin 10mg  given on 5/31 6/2: INR = 1. 67 Warfarin 10mg  given  on 6/1 6/3: INR = 1.70 Warfarin 5mg  given on 6/2  Drug Interactions: Levaquin 750mg  daily may increase anticoagulant effect of warfarin.  [place] of Therapy:  INR 2-3    Plan:   Give Warfarin 7.5mg  dose x1 tonight  Daily PT/INR  Education  Kizzie Furnish, PharmD Pager: [phone] 08/20/2013 10:04 AM

## 2013-08-21 DIAGNOSIS — R109 Unspecified abdominal pain: Secondary | ICD-10-CM

## 2013-08-21 LAB — CBC WITH DIFFERENTIAL/PLATELET
Basophils Absolute: 0 10*3/uL (ref 0.0–0.1)
Basophils Relative: 0 % (ref 0–1)
Eosinophils Absolute: 0 10*3/uL (ref 0.0–0.7)
Eosinophils Relative: 0 % (ref 0–5)
HCT: 34.4 % — ABNORMAL LOW (ref 36.0–46.0)
Hemoglobin: 11.1 g/dL — ABNORMAL LOW (ref 12.0–15.0)
Lymphocytes Relative: 27 % (ref 12–46)
Lymphs Abs: 2.7 10*3/uL (ref 0.7–4.0)
MCH: 26.3 pg (ref 26.0–34.0)
MCHC: 32.3 g/dL (ref 30.0–36.0)
MCV: 81.5 fL (ref 78.0–100.0)
Monocytes Absolute: 1.2 10*3/uL — ABNORMAL HIGH (ref 0.1–1.0)
Monocytes Relative: 12 % (ref 3–12)
Neutro Abs: 6.1 10*3/uL (ref 1.7–7.7)
Neutrophils Relative %: 61 % (ref 43–77)
Platelets: 339 10*3/uL (ref 150–400)
RBC: 4.22 MIL/uL (ref 3.87–5.11)
RDW: 18.3 % — ABNORMAL HIGH (ref 11.5–15.5)
WBC: 10 10*3/uL (ref 4.0–10.5)

## 2013-08-21 LAB — PROTIME-INR
INR: 1.6 — ABNORMAL HIGH (ref 0.00–1.49)
Prothrombin Time: 18.6 seconds — ABNORMAL HIGH (ref 11.6–15.2)

## 2013-08-21 MED ORDER — BUDESONIDE-FORMOTEROL FUMARATE 80-4.5 MCG/ACT IN AERO: 2.0000 | INHALATION_SPRAY | Freq: Two times a day (BID) | RESPIRATORY_TRACT | Status: DC

## 2013-08-21 MED ORDER — HYDROCODONE-ACETAMINOPHEN 7.5-325 MG PO TABS: 1.0000 | ORAL_TABLET | Freq: Four times a day (QID) | ORAL | Status: DC | PRN

## 2013-08-21 MED ORDER — WARFARIN SODIUM 10 MG PO TABS
10.0000 mg | ORAL_TABLET | Freq: Once | ORAL | Status: AC
  Administered 2013-08-21: 10 mg via ORAL
  Filled 2013-08-21: qty 1

## 2013-08-21 MED ORDER — FERROUS SULFATE 325 (65 FE) MG PO TABS: 325.0000 mg | ORAL_TABLET | Freq: Two times a day (BID) | ORAL | Status: DC

## 2013-08-21 MED ORDER — WARFARIN SODIUM 10 MG PO TABS: 10.0000 mg | ORAL_TABLET | Freq: Every day | ORAL | Status: DC

## 2013-08-21 MED ORDER — LISINOPRIL 5 MG PO TABS: 5.0000 mg | ORAL_TABLET | Freq: Every day | ORAL | Status: DC

## 2013-08-21 MED ORDER — ENOXAPARIN SODIUM 150 MG/ML ~~LOC~~ SOLN: 1.0000 mg/kg | Freq: Two times a day (BID) | SUBCUTANEOUS | Status: DC

## 2013-08-21 NOTE — Clinical Documentation Improvement (Signed)
Possible Clinical conditions  Morbid Obesity W/ BMI=41.6  Other condition___________________  Cannot clinically determine _____________  Risk Factors:Hx recurrent DVTs, HTN, Sleep apnea, COPD, DM   Diagnostics:Height: 5\' 9"  (175.3 cm)  Weight: 281 lb 1.4 oz (127.5 kg)    Thank You, Philippa Chester ,RN Clinical Documentation Specialist:  [place] [facility]

## 2013-08-21 NOTE — Discharge Summary (Addendum)
Physician Discharge Summary  [name] [place] [id number] DOB: Apr 01, 1955 DOA: 08/17/2013  PCP: Philis Fendt, MD  Admit date: 08/17/2013 Discharge date: 08/21/2013  Time spent: 45 minutes  Recommendations for Outpatient Follow-up:  -Will be discharged home today. -Will need to take coumadin 10 mg and lovenox 130 mg BID until seen at the coumadin clinic on Monday. -Advised to follow up with her PCP in 2 weeks.   Discharge Diagnoses:  Active Problems:   Hypokalemia   Type II or unspecified type diabetes mellitus without mention of complication, not stated as uncontrolled   HTN (hypertension)   Bilateral deep vein thromboses   COPD exacerbation   Unspecified constipation   Morbid obesity   Discharge Condition: Stable and improved  Filed Weights   08/17/13 1241  Weight: 127.5 kg (281 lb 1.4 oz)    History of present illness:  [name] is a 58 y.o. female with h/o copd, DVT, diabetes mellitus, depression, hypertension, cocaine abuse, comes for worsening sob since Friday associated with productive cough , mild subjective fevers and chills. On arrival to ED, she was found to be diffusely wheezing, was given IV steroids, continuous nebs and was on NRB. She later on was transitioned to [facility] and referred to [facility] service for admission for copd exacerbation. She denies Active smoking. Her cxr is negative for pneumonia.    Hospital Course:   COPD with Acute Exacerbation -Resolved. -Continue symbicort/spiriva and rescue albuterol inhaler. -No need for further abx on DC.  HTN -lisinopril has been added to her home regimen. -Will need to be monitored in the OP setting.  H/O DVT -INR subtherapeutic. -continue coumadin 10 mg daily and a lovenox bridge of 130 mg BID until seen by the coumadin clnic in 5 days.  DM -Well controlled. -A1C 6.9.  Procedures:  None   Consultations:  None  Discharge Instructions  Discharge Instructions   Diet - low sodium heart  healthy    Complete by:  As directed      Discontinue IV    Complete by:  As directed      Increase activity slowly    Complete by:  As directed             Medication List         albuterol 108 (90 BASE) MCG/ACT inhaler  Commonly known as:  PROVENTIL HFA;VENTOLIN HFA  Inhale 2 puffs into the lungs every 4 (four) hours as needed. For shortness of breath     amLODipine 5 MG tablet  Commonly known as:  NORVASC  Take 5 mg by mouth daily.     budesonide-formoterol 80-4.5 MCG/ACT inhaler  Commonly known as:  SYMBICORT  Inhale 2 puffs into the lungs 2 (two) times daily.     cloNIDine 0.1 MG tablet  Commonly known as:  CATAPRES  Take 0.1 mg by mouth 2 (two) times daily.     diltiazem 180 MG 24 hr capsule  Commonly known as:  CARDIZEM CD  Take 180 mg by mouth daily.     DULoxetine 60 MG capsule  Commonly known as:  CYMBALTA  Take 60 mg by mouth daily.     enoxaparin 150 MG/ML injection  Commonly known as:  LOVENOX  Inject 0.85 mLs (130 mg total) into the skin every 12 (twelve) hours.     ferrous sulfate 325 (65 FE) MG tablet  Take 1 tablet (325 mg total) by mouth 2 (two) times daily with a meal.  gabapentin 300 MG capsule  Commonly known as:  NEURONTIN  Take 300 mg by mouth Three times a day.     hydrochlorothiazide 12.5 MG capsule  Commonly known as:  MICROZIDE  Take 12.5 mg by mouth daily.     HYDROcodone-acetaminophen 7.5-325 MG per tablet  Commonly known as:  NORCO  Take 1 tablet by mouth every 6 (six) hours as needed for moderate pain.     lisinopril 5 MG tablet  Commonly known as:  PRINIVIL,ZESTRIL  Take 1 tablet (5 mg total) by mouth daily.     loratadine 10 MG tablet  Commonly known as:  CLARITIN  Take 10 mg by mouth daily as needed. For allergies.     metFORMIN 500 MG tablet  Commonly known as:  GLUCOPHAGE  Take 500 mg by mouth at bedtime.     omeprazole 20 MG capsule  Commonly known as:  PRILOSEC  Take 20 mg by mouth 2 (two) times daily.       orphenadrine 100 MG tablet  Commonly known as:  NORFLEX  Take 1 tablet (100 mg total) by mouth 2 (two) times daily.     polyethylene glycol packet  Commonly known as:  MIRALAX / GLYCOLAX  Take 17 g by mouth daily.     pravastatin 40 MG tablet  Commonly known as:  PRAVACHOL  Take 1 tablet by mouth daily.     QUEtiapine 50 MG tablet  Commonly known as:  SEROQUEL  Take 50 mg by mouth 2 (two) times daily.     warfarin 10 MG tablet  Commonly known as:  COUMADIN  Take 1 tablet (10 mg total) by mouth daily.       Allergies  Allergen Reactions  . Corticosteroids Rash    States she developed a rash on her tongue after a steroid injection in her back       Follow-up Information   Follow up with AVBUERE,EDWIN A, MD. Schedule an appointment as soon as possible for a visit in 2 days.   Specialty:  Internal Medicine   Contact information:   [place] [address] [phone]       Follow up In 5 days. (On Monday with the coumadin clinic)        The results of significant diagnostics from this hospitalization (including imaging, microbiology, ancillary and laboratory) are listed below for reference.    Significant Diagnostic Studies: Ct Lumbar Spine Wo Contrast  07/23/2013   CLINICAL DATA:  Low back pain and right leg pain. Prior fusion at L4-5. Lumbar spondylosis.  EXAM: CT LUMBAR SPINE WITHOUT CONTRAST  TECHNIQUE: Multidetector CT imaging of the lumbar spine was performed without intravenous contrast administration. Multiplanar CT image reconstructions were also generated.  COMPARISON:  Radiographs dated 03/24/2013 and CT scan dated 11/30/2011  FINDINGS: The patient has a stable 2.9 cm right adrenal adenoma. Paraspinal soft tissues are otherwise normal.  T11-12: Tiny disc bulge into the right neural foramen without neural impingement.  T12-L1:  Normal.  L1-2:  Normal.  L2-3:  Normal.  L3-4: Normal disc. Minimal degenerative changes of the inferior aspect of the  left facet joint, unchanged.  L4-5: Interval solid anterior, interbody and posterior fusion. The no neural impingement. No foraminal stenosis.  L5-S1: Normal disc. Minimal degenerative changes of the facet joints, stable.  IMPRESSION: 1. Solid fusion at L4-5 with no neural impingement. 2. Stable appearance of the remainder of the lumbar spine. 3. Mild bilateral facet arthritis at L5-S1, unchanged.  Electronically Signed   By: Rozetta Nunnery M.D.   On: 07/23/2013 13:36   Mr Lumbar Spine W Wo Contrast  07/23/2013   CLINICAL DATA:  Low back and right buttock and leg pain. History of prior lumbar surgery.  BUN and creatinine were obtained on site at [place] [facility] [place] at  [address]  Results:  BUN 8.0 mg/dL,  Creatinine 0.7 mg/dL.  EXAM: MRI LUMBAR SPINE WITHOUT AND WITH CONTRAST  TECHNIQUE: Multiplanar and multiecho pulse sequences of the lumbar spine were obtained without and with intravenous contrast.  CONTRAST:  42mL MULTIHANCE GADOBENATE DIMEGLUMINE 529 MG/ML IV SOLN  COMPARISON:  CT scan 07/23/2013 and lumbar spine MRI 11/05/2011  FINDINGS: Normal overall alignment of the lumbar vertebral bodies. They demonstrate normal marrow signal except for endplate reactive changes and a few small scattered hemangiomas. The conus medullaris terminates at L1. Anterior and interbody fusion changes are noted at L4-5.  T11-12: Small central disc protrusion.  No foraminal stenosis.  T12-L1:  No significant findings.  L1-2:  No significant findings.  Mild facet disease.  L2-3: Mild annular bulge and moderate facet disease but no spinal or foraminal stenosis.  L3-4: Mild diffuse annular bulge and moderate facet disease. There is mild lateral recess encroachment bilaterally but no significant spinal or foraminal stenosis.  L4-5: Anterior and interbody fusion changes. No spinal or foraminal stenosis. Advanced facet disease.  L5-S1: Advanced facet disease. No focal disc protrusion, spinal or foraminal stenosis.  IMPRESSION:  1. Postoperative changes at L4-5 with anterior and interbody fusion. No complicating features. No spinal or foraminal stenosis. 2. Small central disc protrusion at T11-12. 3. Mild lateral recess encroachment bilaterally at L3-4.   Electronically Signed   By: Kalman Jewels M.D.   On: 07/23/2013 14:02   Dg Chest Port 1 View  08/17/2013   CLINICAL DATA:  Extreme shortness of breath, cough, history of heart murmur, COPD, diabetes, hypertension  EXAM: PORTABLE CHEST - 1 VIEW  COMPARISON:  02/05/2012; 12/26/2011; 12/02/2010  FINDINGS: Grossly unchanged enlarged cardiac silhouette and mediastinal contours given decreased lung volumes and patient rotation. There is persistent rightward deviation of the tracheal air column at the level of the aortic arch possibly accentuated due to rotation. Mild pulmonary venous congestion without frank evidence of edema. Worsening peri and infrahilar heterogeneous opacities. No focal airspace opacities. No pleural effusion or pneumothorax. Unchanged bones including lower cervical ACDF.  IMPRESSION: Cardiomegaly, pulmonary venous congestion and perihilar atelectasis without acute cardiopulmonary disease on this hypoventilated AP portable examination. Further evaluation with a PA and lateral chest radiograph may be obtained as clinically indicated.   Electronically Signed   By: Sandi Mariscal M.D.   On: 08/17/2013 11:00   Dg Abd 2 Views  08/19/2013   CLINICAL DATA:  Right lower abdominal pain.  History of gallstones.  EXAM: ABDOMEN - 2 VIEW  COMPARISON:  02/02/2012.  Chest x-ray 08/17/2013  FINDINGS: Moderate stool throughout the colon with mild gaseous distention. No evidence of bowel obstruction. No free air organomegaly. Prior cholecystectomy. Calcified phleboliths in the anatomic pelvis. No acute bony abnormality.  IMPRESSION: Moderate gas and stool throughout the colon. No evidence of bowel obstruction or free air.   Electronically Signed   By: Rolm Baptise M.D.   On: 08/19/2013  14:50    Microbiology: Recent Results (from the past 240 hour(s))  CULTURE, GROUP A STREP     Status: None   Collection Time    08/17/13  9:42 AM  Result Value Ref Range Status   Specimen Description THROAT   Final   Special Requests NONE   Final   Culture     Final   Value: No Beta Hemolytic Streptococci Isolated     Performed at [facility]   Report Status 08/19/2013 FINAL   Final  RAPID STREP SCREEN     Status: None   Collection Time    08/17/13  2:13 PM      Result Value Ref Range Status   Streptococcus, Group A Screen (Direct) NEGATIVE  NEGATIVE Final   Comment: (NOTE)     A Rapid Antigen test may result negative if the antigen level in the     sample is below the detection level of this test. The FDA has not     cleared this test as a stand-alone test therefore the rapid antigen     negative result has reflexed to a Group A Strep culture.  CULTURE, EXPECTORATED SPUTUM-ASSESSMENT     Status: None   Collection Time    08/18/13 11:10 AM      Result Value Ref Range Status   Specimen Description SPUTUM   Final   Special Requests NONE   Final   Sputum evaluation     Final   Value: THIS SPECIMEN IS ACCEPTABLE. RESPIRATORY CULTURE REPORT TO FOLLOW.   Report Status 08/18/2013 FINAL   Final  CULTURE, RESPIRATORY (NON-EXPECTORATED)     Status: None   Collection Time    08/18/13 11:10 AM      Result Value Ref Range Status   Specimen Description SPUTUM   Final   Special Requests NONE   Final   Gram Stain     Final   Value: ABUNDANT WBC PRESENT,BOTH PMN AND MONONUCLEAR     RARE SQUAMOUS EPITHELIAL CELLS PRESENT     MODERATE GRAM POSITIVE COCCI IN PAIRS     RARE GRAM POSITIVE RODS     Performed at [facility]   Culture     Final   Value: NORMAL OROPHARYNGEAL FLORA     Performed at [facility]   Report Status 08/20/2013 FINAL   Final     Labs: Basic Metabolic Panel:  Recent Labs Lab 08/17/13 0953 08/18/13 0501 08/20/13 0451  NA 138 134*  136*  K 3.4* 3.9 4.3  CL 96 95* 96  CO2 28 24 29   GLUCOSE 157* 277* 166*  BUN 11 21 24*  CREATININE 0.73 0.77 0.76  CALCIUM 9.0 9.4 9.5   Liver Function Tests:  Recent Labs Lab 08/17/13 0953  AST 19  ALT 19  ALKPHOS 100  BILITOT 0.5  PROT 8.2  ALBUMIN 3.5   No results found for this basename: LIPASE, AMYLASE,  in the last 168 hours No results found for this basename: AMMONIA,  in the last 168 hours CBC:  Recent Labs Lab 08/17/13 0953 08/18/13 0501 08/20/13 0451 08/21/13 0500  WBC 14.2* 14.4* 12.7* 10.0  NEUTROABS 10.0*  --   --  6.1  HGB 10.7* 10.7* 11.1* 11.1*  HCT 32.5* 33.2* 34.5* 34.4*  MCV 80.2 81.0 81.6 81.5  PLT 324 304 374 339   Cardiac Enzymes:  Recent Labs Lab 08/17/13 0953  TROPONINI <0.30   BNP: BNP (last 3 results)  Recent Labs  08/17/13 0953  PROBNP 54.7   CBG: No results found for this basename: GLUCAP,  in the last 168 hours     Signed:  Erline Hau  [facility]  Pager: [phone] 08/21/2013, 11:02 AM

## 2013-08-21 NOTE — Discharge Instructions (Signed)
Take coumadin 10 mg daily and lovenox 130 mg daily until you have four coumadin level checked by the coumadin clinic on Monday.

## 2013-08-21 NOTE — Progress Notes (Signed)
ANTICOAGULATION CONSULT NOTE - Follow-up Consult  Pharmacy Consult for Warfarin Indication: Hx recurrent DVTs/Lupus anticoagulant +  Allergies  Allergen Reactions  . Corticosteroids Rash    States she developed a rash on her tongue after a steroid injection in her back    Patient Measurements: Height: 5\' 9"  (175.3 cm) Weight: 281 lb 1.4 oz (127.5 kg) IBW/kg (Calculated) : 66.2   Vital Signs: Temp: 97.5 F (36.4 C) (06/04 0537) Temp src: Oral (06/04 0537) BP: 155/88 mmHg (06/04 0537) Pulse Rate: 73 (06/04 0537)  Labs:  Recent Labs  08/19/13 0432 08/20/13 0451 08/21/13 0500  HGB  --  11.1* 11.1*  HCT  --  34.5* 34.4*  PLT  --  374 339  LABPROT 19.2* 19.5* 18.6*  INR 1.67* 1.70* 1.60*  CREATININE  --  0.76  --     Estimated Creatinine Clearance: 109.8 ml/min (by C-G formula based on Cr of 0.76).   Medical History: Past Medical History  Diagnosis Date  . DVT (deep venous thrombosis)     Recurrent  . Adrenal mass 03/226    Benign  . Lupus Anticoagulant Positive   . Clotting disorder     +beta-2-glycoprotein IgA antibody  . History of cocaine abuse     Remote history   . Heart murmur   . Hypertension     takes meds daily  . Depression   . Shortness of breath     exertion or lying flat  . 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 Sleep apnea     2l of oxygen at night  . On home oxygen therapy     at night  . Diabetes mellitus     120s usually fasting  . GERD (gastroesophageal reflux disease)   . Dizziness, nonspecific   . Arthritis     knees/multiple orthopedic conditons; lower back  . History of blood transfusion   . Fibromyalgia     Medications:  Scheduled:  . budesonide-formoterol  2 puff Inhalation BID  . cloNIDine  0.1 mg Oral BID  . vitamin B-12  500 mcg Oral Daily  . diltiazem  180 mg Oral Daily  . DULoxetine  60 mg Oral Daily  . enoxaparin (LOVENOX) injection  1 mg/kg Subcutaneous Q12H  .  ferrous sulfate  325 mg Oral BID WC  . gabapentin  300 mg Oral TID  . ipratropium-albuterol  3 mL Nebulization TID  . levofloxacin  750 mg Oral Daily  . lisinopril  5 mg Oral Daily  . magnesium hydroxide  30 mL Oral Daily  . orphenadrine  100 mg Oral BID  . pantoprazole  40 mg Oral Daily  . polyethylene glycol  17 g Oral Daily  . pravastatin  40 mg Oral q1800  . QUEtiapine  50 mg Oral BID  . senna-docusate  2 tablet Oral BID  . sodium chloride  3 mL Intravenous Q12H  . Warfarin - Pharmacist Dosing Inpatient   Does not apply q1800   Infusions:    Assessment: 58 yo c/o SOB admitted with COPD exacerbation on chronic warfarin for hx recurrent DVTs and lupus anticoagulant +.  INR= 1.17 on admission, pt given Lovenox x 1 dose in ED.  HD= 9 mg daily except 5 mg on Fridays.    INR continues to be subtherapeutic and holding steady around 1.6 to 1.7 and Levaquin is showing not to have real effect on INR thus far  Inpatient warfarin doses given: 10mg ,  10mg , 5mg , 7.5mg   No reported bleeding  Hgb and plts steady on full dose Lovenox 130mg  q12 until INR therapeutic  Drug Interactions: Levaquin 750mg  daily may increase anticoagulant effect of warfarin.  [place] of Therapy:  INR 2-3   Plan:  1) Warfarin 10mg  today 2) Daily INR  Adrian Saran, PharmD, BCPS Pager [phone] 08/21/2013 8:18 AM

## 2013-08-21 NOTE — Progress Notes (Signed)
Received report from [place], Therapist, sports. I agree with assessment. Will continue to monitor closely.

## 2013-08-21 NOTE — Care Management Note (Signed)
    Page 1 of 1   08/21/2013     10:27:24 AM CARE MANAGEMENT NOTE 08/21/2013  Patient:  [name], [name]   Account Number:  [id number]  Date Initiated:  08/18/2013  Documentation initiated by:  Olga Coaster  Subjective/Objective Assessment:   ADMITTED WITH COPD     Action/Plan:   CM FOLLOWING FOR DCP   Anticipated DC Date:  08/21/2013   Anticipated DC Plan:  [place]  CM consult      Choice offered to / List presented to:             Status of service:  Completed, signed off Medicare Important Message given?  YES (If response is "NO", the following Medicare IM given date fields will be blank) Date Medicare IM given:  08/21/2013 Date Additional Medicare IM given:    Discharge Disposition:  HOME/SELF CARE  Per UR Regulation:  Reviewed for med. necessity/level of care/duration of stay  If discussed at [place] of Stay Meetings, dates discussed:    Comments:  08/21/13 Ashlee Player RN,BSN NCM [phone] D/C HOME.PATIENT HAS MINIMAL CO PAY FOR LOVENOX $3.MD/PATIENT UPDATED.  6/1/2015Mindi Slicker RN,SBN,MHA [phone]

## 2013-08-22 IMAGING — XA IR IVC FILTER RETRIEVAL / S&I /IMG GUID/MOD SED
1 series · 8 of 8 positions shown · non-contrast
Comparison: none

CLINICAL DATA: IVC filter placed preoperatively before knee
replacement.  The patient was taken off anticoagulation for the
surgery and therefore the filter was necessary for history of
thromboembolic disease.  The patient is now postop and ambulatory.
Filter can be removed.

[Series 1: run · 8 of 8 slices shown]
[im 1/8]
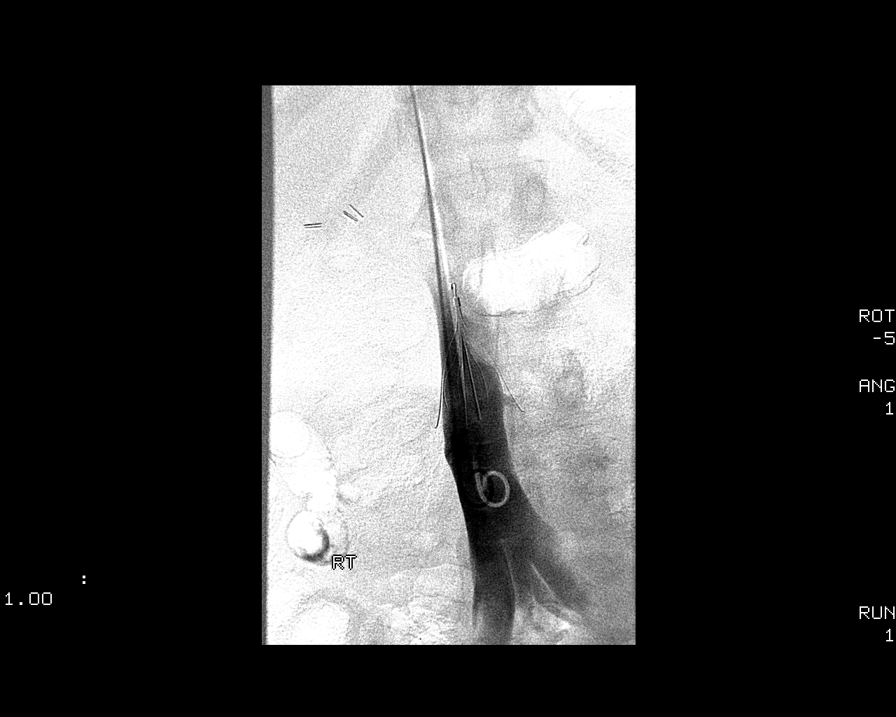
[im 2/8]
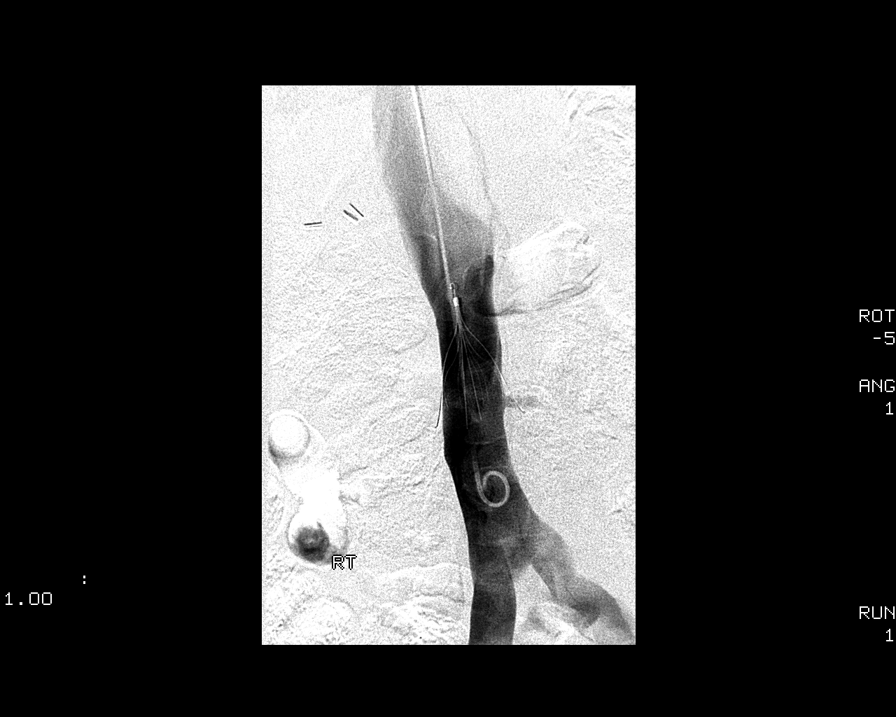
[im 3/8]
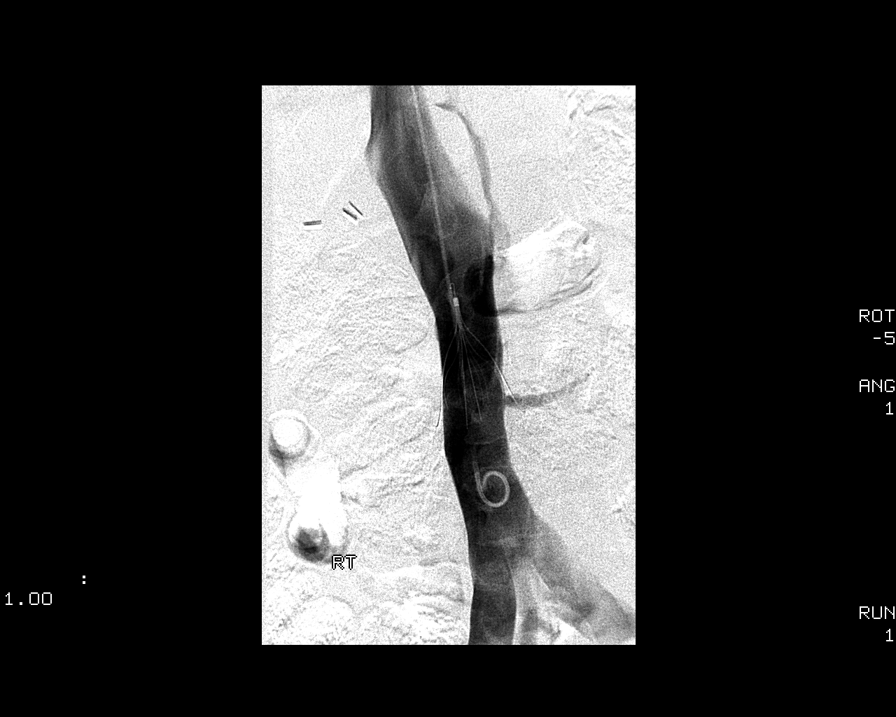
[im 4/8]
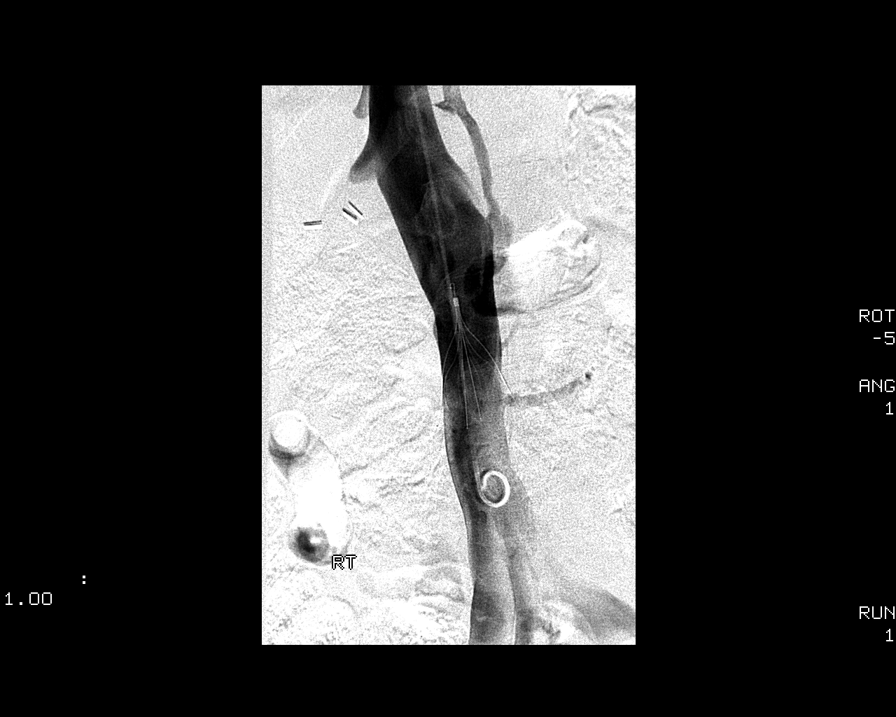
[im 5/8]
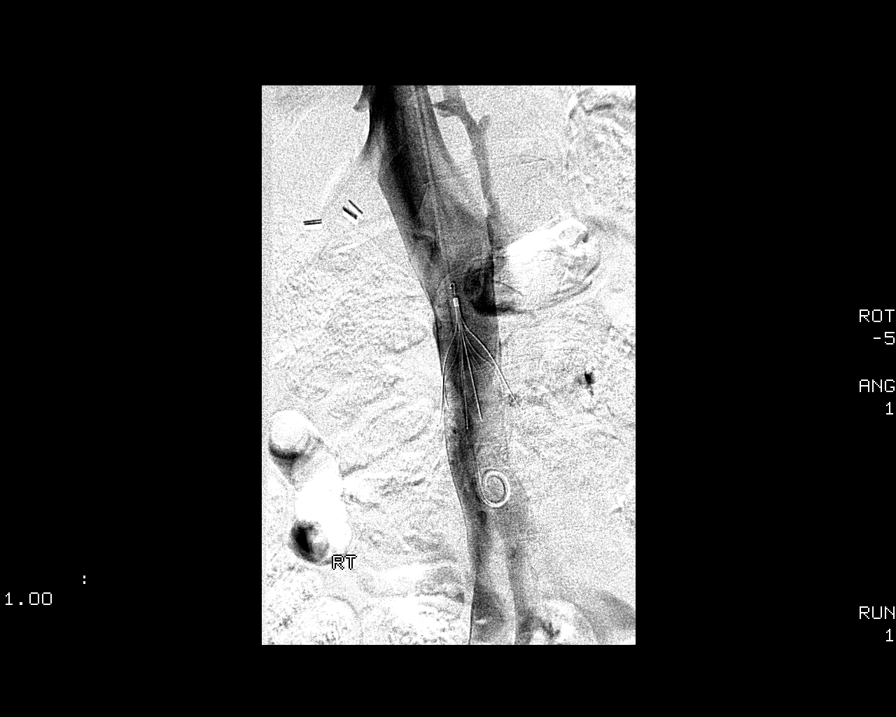
[im 6/8]
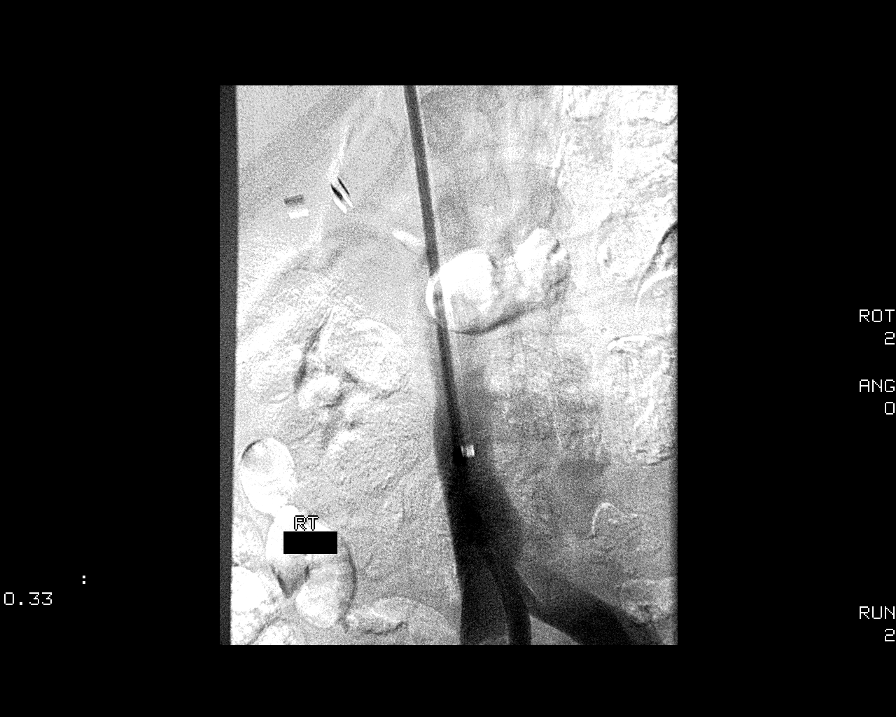
[im 7/8]
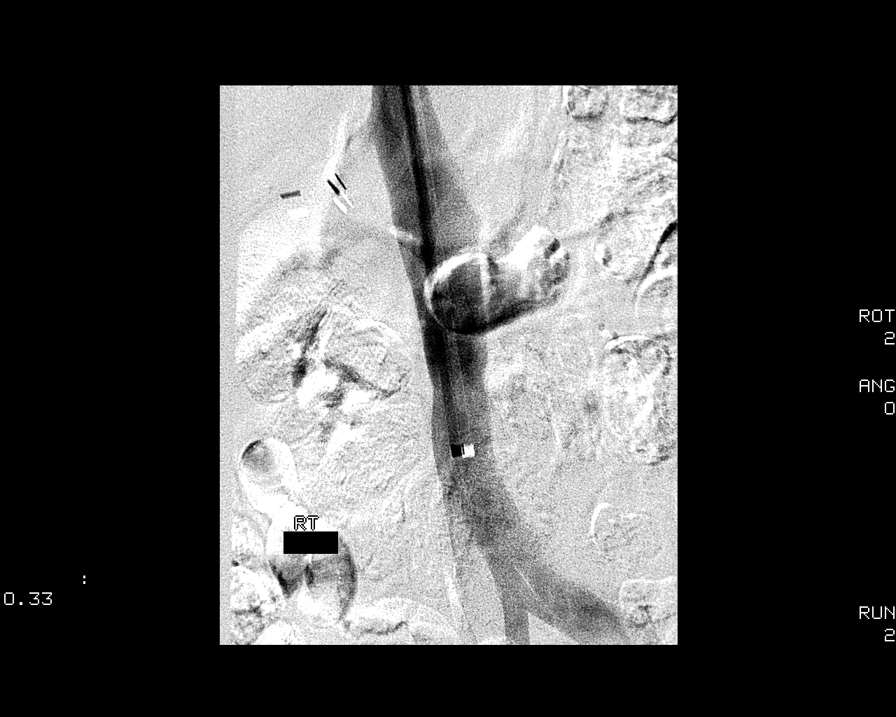
[im 8/8]
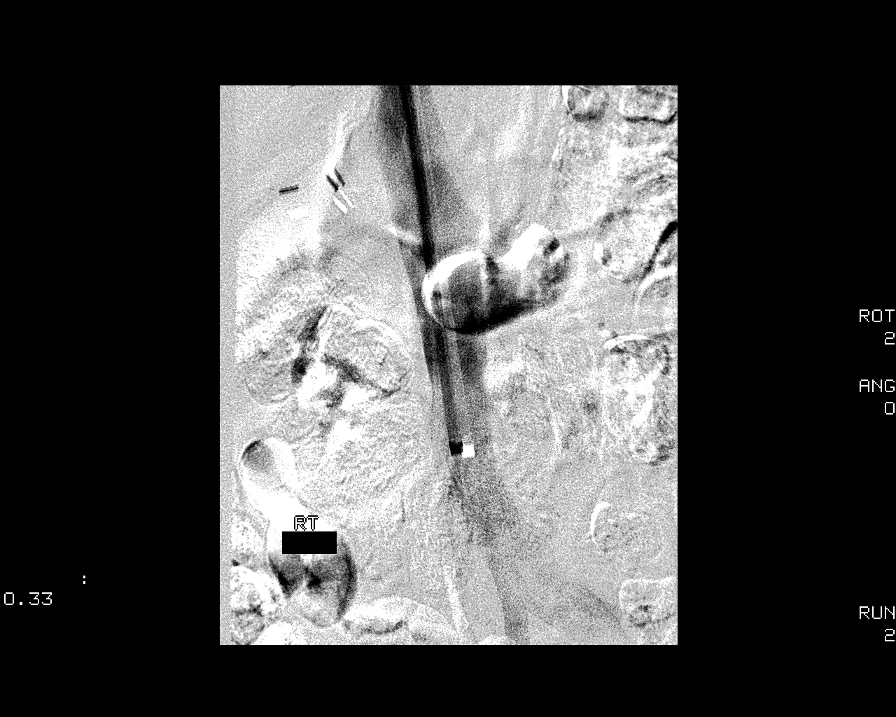

[8 of 8 positions shown; findings below may reference images not displayed]

ULTRASOUND GUIDANCE FOR VASCULAR ACCESS.

IVC CATHETERIZATION AND VENOGRAM

IVC FILTER RETRIEVAL

Date:  03/01/2011 [DATE]

Radiologist:  Amani Prs, M.D.

Medications:  1 mg Versed, 50 mcg Fentanyl

Guidance:  Ultrasound fluoroscopic

Fluoroscopy time:  4.5 minutes

Sedation time:  15 minutes

Contrast volume:  60 ml 3mnipaque-EXX

Complications:  No immediate

PROCEDURE/FINDINGS:

Informed consent was obtained from the patient following
explanation of the procedure, risks, benefits and alternatives.
The patient understands, agrees and consents for the procedure.
All questions were addressed.  A time out was performed.

Maximal barrier sterile technique utilized including caps, mask,
sterile gowns, sterile gloves, large sterile drape, hand hygiene,
and betadine

Under sterile conditions and local anesthesia, right IJ
micropuncture venous access was performed.  This was done with
ultrasound.  Images obtained for documentation.  Guide wire
advanced followed by the 4-French dilator set.  This allowed
advancement of the Bentson guide wire into the IVC below the
filter.  Pigtail catheter advanced below the filter.  IVC venogram
performed.

IVC venogram:  IVC is patent.  Filter is positioned below the renal
veins.  No evidence of thrombus, IVC occlusion or narrowing.

IVC filter retrieval:  Guide wire was advanced.  Pigtail was
removed and exchanged for the Jullon Lienad retrieval sheath.  Sheath
was positioned above the filter.  The snare was utilized to capture
the filter apex.  Filter was collapsed within the sheath and
removed entirely.  No immediate complication.

Post removal, IVC venogram repeated demonstrating no acute
abnormality or complication.

The patient tolerated the procedure well.  No immediate
complication.
IMPRESSION: Successful infrarenal IVC filter retrieval.

## 2013-08-25 ENCOUNTER — Other Ambulatory Visit (HOSPITAL_BASED_OUTPATIENT_CLINIC_OR_DEPARTMENT_OTHER): Payer: Medicare Other

## 2013-08-25 ENCOUNTER — Ambulatory Visit (HOSPITAL_BASED_OUTPATIENT_CLINIC_OR_DEPARTMENT_OTHER): Payer: Medicare Other | Admitting: Pharmacist

## 2013-08-25 DIAGNOSIS — I82409 Acute embolism and thrombosis of unspecified deep veins of unspecified lower extremity: Secondary | ICD-10-CM

## 2013-08-25 DIAGNOSIS — I82509 Chronic embolism and thrombosis of unspecified deep veins of unspecified lower extremity: Secondary | ICD-10-CM

## 2013-08-25 DIAGNOSIS — I82403 Acute embolism and thrombosis of unspecified deep veins of lower extremity, bilateral: Secondary | ICD-10-CM

## 2013-08-25 LAB — PROTIME-INR
INR: 1.8 — ABNORMAL LOW (ref 2.00–3.50)
Protime: 21.6 Seconds — ABNORMAL HIGH (ref 10.6–13.4)

## 2013-08-25 LAB — POCT INR: INR: 1.8

## 2013-08-25 NOTE — Patient Instructions (Signed)
Take 10 mg tonight then Continue Coumadin 9mg  daily except 5mg  on Fridays. Stop lovenox injections Recheck INR in 3 weeks on 09/22/13; Lab at 9am and coumadin clinic at 9:15am.

## 2013-08-25 NOTE — Progress Notes (Signed)
INR just below [place] today Pt recently discharged from hospital end of last week after COPD exacerbation Pt was on levaquin while in the hospital but this was D/C'd on discharge (appeared to have no/minimal effect on INR) Due to subtherapeutic INR on admission, lovenox bridge was initiated on 5/31 and has continued until INR therapueutic. Pt states she has been using lovenox twice daily at home as instructed Discharge summary instructions mention coumadin dose to be 10 mg daily until pt seen in coumadin clinic Pt states she has only been taking her usual regimen of 9 mg daily except for 5 mg on Fridays.  While in the hospital pt received coumadin 10 mg on 5/31, 10 mg on 6/1, 5 mg on 6/2 (concern for interaction with levaquin), 7.5 mg, on 6/3 and 10 mg on 6/4 INR is trending towards [place] and is now 1.8. Pt can now D/C lovenox shots as INR is right at [place]. Will also have patient take 10 mg again tonight. Plan: Take 10 mg tonight then Continue Coumadin 9mg  daily except 5mg  on Fridays. Stop lovenox injections Recheck INR in 3 weeks on 09/22/13; Lab at 9am and coumadin clinic at 9:15am.

## 2013-08-26 ENCOUNTER — Ambulatory Visit (INDEPENDENT_AMBULATORY_CARE_PROVIDER_SITE_OTHER): Payer: Medicare Other | Admitting: Podiatry

## 2013-08-26 ENCOUNTER — Encounter: Payer: Self-pay | Admitting: Podiatry

## 2013-08-26 VITALS — BP 143/77 | HR 87 | Resp 16

## 2013-08-26 DIAGNOSIS — M79609 Pain in unspecified limb: Secondary | ICD-10-CM

## 2013-08-26 DIAGNOSIS — B351 Tinea unguium: Secondary | ICD-10-CM

## 2013-08-26 NOTE — Progress Notes (Signed)
She presents today chief complaint of painful nails 1 through 5 bilateral.  Objective: Nails are thick yellow dystrophic with mycotic painful palpation.  Assessment: Pain in limb secondary to onychomycosis 1 through 5 bilateral.  Plan: Debridement of nails 1 through 5 bilateral covered service secondary to pain.

## 2013-08-26 NOTE — Patient Instructions (Signed)
Diabetes and Foot Care Diabetes may cause you to have problems because of poor blood supply (circulation) to your feet and legs. This may cause the skin on your feet to become thinner, break easier, and heal more slowly. Your skin may become dry, and the skin may peel and crack. You may also have nerve damage in your legs and feet causing decreased feeling in them. You may not notice minor injuries to your feet that could lead to infections or more serious problems. Taking care of your feet is one of the most important things you can do for yourself.  HOME CARE INSTRUCTIONS  Wear shoes at all times, even in the house. Do not go barefoot. Bare feet are easily injured.  Check your feet daily for blisters, cuts, and redness. If you cannot see the bottom of your feet, use a mirror or ask someone for help.  Wash your feet with warm water (do not use hot water) and mild soap. Then pat your feet and the areas between your toes until they are completely dry. Do not soak your feet as this can dry your skin.  Apply a moisturizing lotion or petroleum jelly (that does not contain alcohol and is unscented) to the skin on your feet and to dry, brittle toenails. Do not apply lotion between your toes.  Trim your toenails straight across. Do not dig under them or around the cuticle. File the edges of your nails with an emery board or nail file.  Do not cut corns or calluses or try to remove them with medicine.  Wear clean socks or stockings every day. Make sure they are not too tight. Do not wear knee-high stockings since they may decrease blood flow to your legs.  Wear shoes that fit properly and have enough cushioning. To break in new shoes, wear them for just a few hours a day. This prevents you from injuring your feet. Always look in your shoes before you put them on to be sure there are no objects inside.  Do not cross your legs. This may decrease the blood flow to your feet.  If you find a minor scrape,  cut, or break in the skin on your feet, keep it and the skin around it clean and dry. These areas may be cleansed with mild soap and water. Do not cleanse the area with peroxide, alcohol, or iodine.  When you remove an adhesive bandage, be sure not to damage the skin around it.  If you have a wound, look at it several times a day to make sure it is healing.  Do not use heating pads or hot water bottles. They may burn your skin. If you have lost feeling in your feet or legs, you may not know it is happening until it is too late.  Make sure your health care provider performs a complete foot exam at least annually or more often if you have foot problems. Report any cuts, sores, or bruises to your health care provider immediately. SEEK MEDICAL CARE IF:   You have an injury that is not healing.  You have cuts or breaks in the skin.  You have an ingrown nail.  You notice redness on your legs or feet.  You feel burning or tingling in your legs or feet.  You have pain or cramps in your legs and feet.  Your legs or feet are numb.  Your feet always feel cold. SEEK IMMEDIATE MEDICAL CARE IF:   There is increasing redness,   swelling, or pain in or around a wound.  There is a red line that goes up your leg.  Pus is coming from a wound.  You develop a fever or as directed by your health care provider.  You notice a bad smell coming from an ulcer or wound. Document Released: 03/03/2000 Document Revised: 11/06/2012 Document Reviewed: 08/13/2012 Exit[employer] Patient Information 2014 [employer].  

## 2013-09-11 ENCOUNTER — Telehealth: Payer: Self-pay | Admitting: Oncology

## 2013-09-11 ENCOUNTER — Telehealth: Payer: Self-pay | Admitting: *Deleted

## 2013-09-11 NOTE — Telephone Encounter (Signed)
S/w the pt and she is aware of her July appt with dr Benay Spice.

## 2013-09-11 NOTE — Telephone Encounter (Signed)
Per Dr. Benay Spice; Left voice message that schedulers will be in touch with date/time of appt in one month; call office is questions.

## 2013-09-11 NOTE — Telephone Encounter (Signed)
Call from pt requesting office visit with Dr. Benay Spice. Last seen in office 08/2012. Being followed by [facility] Anticoagulant clinic.

## 2013-09-15 ENCOUNTER — Telehealth: Payer: Self-pay | Admitting: *Deleted

## 2013-09-15 NOTE — Telephone Encounter (Signed)
Left VM reporting "knots coming up all over me, and I'm worried because cancer is in my family". Wishes to be seen prior to her 7/27 appointment.

## 2013-09-15 NOTE — Telephone Encounter (Signed)
Left VM that she needs to have her PCP examine her first, and then he can contact Dr. Benay Spice if he feels oncology needs to become involved.

## 2013-09-22 ENCOUNTER — Other Ambulatory Visit: Payer: Medicare Other

## 2013-09-22 ENCOUNTER — Ambulatory Visit: Payer: Medicare Other

## 2013-09-23 ENCOUNTER — Ambulatory Visit (HOSPITAL_BASED_OUTPATIENT_CLINIC_OR_DEPARTMENT_OTHER): Payer: Medicare Other | Admitting: Pharmacist

## 2013-09-23 ENCOUNTER — Other Ambulatory Visit (HOSPITAL_BASED_OUTPATIENT_CLINIC_OR_DEPARTMENT_OTHER): Payer: Medicare Other

## 2013-09-23 DIAGNOSIS — I82509 Chronic embolism and thrombosis of unspecified deep veins of unspecified lower extremity: Secondary | ICD-10-CM

## 2013-09-23 DIAGNOSIS — I82403 Acute embolism and thrombosis of unspecified deep veins of lower extremity, bilateral: Secondary | ICD-10-CM

## 2013-09-23 DIAGNOSIS — I82409 Acute embolism and thrombosis of unspecified deep veins of unspecified lower extremity: Secondary | ICD-10-CM

## 2013-09-23 LAB — PROTIME-INR
INR: 2.6 (ref 2.00–3.50)
Protime: 31.2 Seconds — ABNORMAL HIGH (ref 10.6–13.4)

## 2013-09-23 LAB — POCT INR: INR: 2.6

## 2013-09-23 NOTE — Progress Notes (Signed)
INR = 2.6 on Coumadin 9 mg daily. She deviated from our recommendation & has not taken 5 mg on Fridays, rather same 9 mg dose every day. No missed doses. No med changes since last visit. Her mother is in the hospital at [facility].  Dx: pancreatic cancer.  She has been too weak to get chemo & will be transferred soon to a rehab facility.  She is going to visit her mother today over at [facility] after our clinic visit. INR at [place].  Continue 9 mg daily. Return in 3 weeks- has MD visit scheduled same day. Kennith Center, Pharm.D., CPP 09/23/2013@9 :34 AM

## 2013-09-26 ENCOUNTER — Other Ambulatory Visit: Payer: Self-pay | Admitting: Internal Medicine

## 2013-09-26 DIAGNOSIS — E2839 Other primary ovarian failure: Secondary | ICD-10-CM

## 2013-09-29 ENCOUNTER — Ambulatory Visit
Admission: RE | Admit: 2013-09-29 | Discharge: 2013-09-29 | Disposition: A | Discharge status: Home / Self Care | Payer: Medicare Other | Source: Ambulatory Visit | Attending: Internal Medicine | Admitting: Internal Medicine

## 2013-09-29 DIAGNOSIS — E2839 Other primary ovarian failure: Secondary | ICD-10-CM

## 2013-10-13 ENCOUNTER — Encounter: Payer: Self-pay | Admitting: Oncology

## 2013-10-13 ENCOUNTER — Ambulatory Visit (HOSPITAL_BASED_OUTPATIENT_CLINIC_OR_DEPARTMENT_OTHER): Payer: Self-pay | Admitting: Pharmacist

## 2013-10-13 ENCOUNTER — Ambulatory Visit (HOSPITAL_BASED_OUTPATIENT_CLINIC_OR_DEPARTMENT_OTHER): Payer: Medicare Other | Admitting: Oncology

## 2013-10-13 ENCOUNTER — Telehealth: Payer: Self-pay | Admitting: Oncology

## 2013-10-13 ENCOUNTER — Other Ambulatory Visit (HOSPITAL_BASED_OUTPATIENT_CLINIC_OR_DEPARTMENT_OTHER): Payer: Medicare Other

## 2013-10-13 VITALS — BP 145/92 | HR 87 | Temp 98.3°F | Resp 19 | Ht 69.0 in | Wt 276.2 lb

## 2013-10-13 DIAGNOSIS — I82409 Acute embolism and thrombosis of unspecified deep veins of unspecified lower extremity: Secondary | ICD-10-CM

## 2013-10-13 DIAGNOSIS — I82509 Chronic embolism and thrombosis of unspecified deep veins of unspecified lower extremity: Secondary | ICD-10-CM

## 2013-10-13 DIAGNOSIS — Z7901 Long term (current) use of anticoagulants: Secondary | ICD-10-CM

## 2013-10-13 DIAGNOSIS — I82403 Acute embolism and thrombosis of unspecified deep veins of lower extremity, bilateral: Secondary | ICD-10-CM

## 2013-10-13 LAB — PROTIME-INR
INR: 2.5 (ref 2.00–3.50)
Protime: 30 Seconds — ABNORMAL HIGH (ref 10.6–13.4)

## 2013-10-13 LAB — POCT INR: INR: 2.5

## 2013-10-13 NOTE — Patient Instructions (Signed)

## 2013-10-13 NOTE — Progress Notes (Signed)
INR = 2.5       [place] 2-3 INR is within [place] range. No complications of anticoagulation noted. Patient states she cut herself with a fingernail a few weeks ago while wiping after using the toilet and had bleeding for a few hours. She cleaned the area, used baby ointment to treat the wound and the bleeding has not returned. She has no medication or dietary changes. She will continue Coumadin 9 mg daily We will recheck INR on 11/10/13; Lab at 9:30 am, Coumadin clinic at 9:45 am.  Theone Murdoch, PharmD

## 2013-10-13 NOTE — Progress Notes (Signed)
  [facility] OFFICE PROGRESS NOTE   Diagnosis: Anticoagulation therapy  INTERVAL HISTORY:   Ms. Knutson returns as scheduled. She continues Coumadin anticoagulation and is followed in the [place] anticoagulation clinic. She denies bleeding. She was hospitalized in June with a COPD flare. Her symptoms have improved. She reports developing "knots "at the right lower leg following discharge from the hospital. She was treated with antibiotics by Dr. Jeanie Cooks and noted improvement.  Objective:  Vital signs in last 24 hours:  Blood pressure 145/92, pulse 87, temperature 98.3 F (36.8 C), temperature source Oral, resp. rate 19, height 5\' 9"  (1.753 m), weight 276 lb 3.2 oz (125.283 kg), SpO2 99.00%.   Resp: Lungs clear bilaterally Cardio: Regular rate and rhythm GI: No hepatosplenomegaly, nontender Vascular: No leg edema, the right upper posterior calf there is a 1 cm nodular area in the subcutaneous tissue. This has the appearance of a thrombosed superficial varicosity. No overlying erythema   Lab Results:  Lab Results  Component Value Date   WBC 10.0 08/21/2013   HGB 11.1* 08/21/2013   HCT 34.4* 08/21/2013   MCV 81.5 08/21/2013   PLT 339 08/21/2013   NEUTROABS 6.1 08/21/2013      Medications: I have reviewed the patient's current medications.  Assessment/Plan: 1. History of bilateral lower extremity deep vein thrombosis. She continues Coumadin and is followed in the [place] [facility] clinic.  2. History of a positive lupus anticoagulant. 3. History of a positive beta-2-glycoprotein IgA antibody. 4. Chronic bilateral leg pain - negative bilateral Doppler January 2010. 5. History of hypertension. 6. COPD. 7. Status post hysterectomy. 8. Remote history of cocaine abuse. 9. History of an adrenal lesion on a CT scan in 2008 - status post a repeat CT on June 16, 2009, showing slow enlargement of the right adrenal nodule since 2006, most consistent with an  adenoma. 10. History of rectal and vaginal bleeding in May 2009.  11. Status post carpal tunnel surgery. 12. 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 Right knee replacement September 2011. 13. Multiple orthopedic conditions. 14. Placement of IVC filter preoperatively before knee replacement. The IVC filter was retrieved 03/01/2011. 15. Anemia. Ferritin in normal range at 82 on 08/18/2013 16. Status post lumbar fusion surgery October 2013    Disposition:  [name] continues chronic Coumadin anticoagulation. She appears to have a small thrombosed superficial varicosity at the right calf. She will contact us for erythema, pain, or progression of this lesion. Ms. Shahin will return for an office visit in 6 months. She says she has undergone a colonoscopy within the past few years.  [name], MD  10/13/2013  10:26 AM

## 2013-10-13 NOTE — Telephone Encounter (Signed)
gv and printed appt sched and avs for pt for Jan 2016 °

## 2013-10-27 NOTE — Telephone Encounter (Signed)
Phone call - encounter closed. 

## 2013-11-10 ENCOUNTER — Other Ambulatory Visit: Payer: Medicare Other

## 2013-11-10 ENCOUNTER — Telehealth: Payer: Self-pay | Admitting: Pharmacist

## 2013-11-10 ENCOUNTER — Ambulatory Visit: Payer: Medicare Other

## 2013-11-10 NOTE — Telephone Encounter (Signed)
Pt did not come for lab/CC today.  She was at the nursing home w/ her mother Jeanella Craze. Darsi requested to reschedule for this Thurs (8/27) at 10 am prior to her mother's appts here at 11 am.  This is to avoid multiple trips to [facility] this week. Kennith Center, Pharm.D., CPP 11/10/2013@11 :47 AM

## 2013-11-13 NOTE — Telephone Encounter (Signed)
Encounter was telephone call. 

## 2013-11-14 ENCOUNTER — Other Ambulatory Visit (HOSPITAL_BASED_OUTPATIENT_CLINIC_OR_DEPARTMENT_OTHER): Payer: Medicare Other

## 2013-11-14 ENCOUNTER — Telehealth: Payer: Self-pay | Admitting: Pharmacist

## 2013-11-14 ENCOUNTER — Ambulatory Visit (HOSPITAL_BASED_OUTPATIENT_CLINIC_OR_DEPARTMENT_OTHER): Payer: Self-pay | Admitting: Pharmacist

## 2013-11-14 DIAGNOSIS — I82409 Acute embolism and thrombosis of unspecified deep veins of unspecified lower extremity: Secondary | ICD-10-CM

## 2013-11-14 DIAGNOSIS — I82509 Chronic embolism and thrombosis of unspecified deep veins of unspecified lower extremity: Secondary | ICD-10-CM

## 2013-11-14 DIAGNOSIS — I82403 Acute embolism and thrombosis of unspecified deep veins of lower extremity, bilateral: Secondary | ICD-10-CM

## 2013-11-14 LAB — PROTIME-INR
INR: 2 (ref 2.00–3.50)
Protime: 24 Seconds — ABNORMAL HIGH (ref 10.6–13.4)

## 2013-11-14 LAB — POCT INR: INR: 2

## 2013-11-14 NOTE — Progress Notes (Signed)
INR = 2.0     Iden 2-3 INR is with in 2-3 [place] range. No complications of anticoagulation noted. No new medications. She is no longer using oxygen and is ambulating without her walker. She states that she will have a spinal injection with Dr. Luan Pulling on 12/01/13. She is aware that we will likely need to stop Coumadin a few days prior to this procedure and may also need to get an INR. I will speak with Dr. Benay Spice this morning for further instructions regarding dosing around this procedure. We will call her with instructions at [phone] (cell). For now, she will continue Coumadin 9 mg daily Recheck INR on 12/12/13; Lab at 9:15 am, Coumadin clinic at 9:30 am.  Theone Murdoch, PharmD  Coumadin samples given:  Coumadin 5 mg #30  Lot [id number]      Exp:  11/16

## 2013-11-14 NOTE — Telephone Encounter (Signed)
Received a VM from [place], [place] at Dr. Maryjean Ka office [facility] Neurosurgery). Mrs. Mcquown is scheduled for a modified branch block on 9/14.   They would like to stop Coumadin 5 days prior to procedure and resume evening of surgery. This is acceptable with Dr. Benay Spice.   They would like [place] to call them on Monday at [phone] ext. [phone] to confirm. We will need to call Mrs. Milks with instructions once they are finalized.

## 2013-11-14 NOTE — Telephone Encounter (Signed)
Left VM with Dr. Clydell Hakim office regarding Coumadin dosing for [name].  [phone] Dr. Benay Spice will leave it up to the neurologist as to when Coumadin will be discontinued (typically 3-4 days) prior to her spinal injection. Dr. Benay Spice would like them to draw an INR day of the procedure prior to procedure. He would like to resume Coumadin the evening of the procedure. We need to call [name] [phone] with instructions when we get them.

## 2013-11-17 ENCOUNTER — Telehealth: Payer: Self-pay | Admitting: Pharmacist

## 2013-11-19 ENCOUNTER — Encounter: Payer: Self-pay | Admitting: Pharmacist

## 2013-11-19 NOTE — Progress Notes (Signed)
Spoke to Ms Acoff and instructed her to hold coumadin 11/26/13 (5 days prior to procedure) and to resume on 12/01/13 (day of procedure) at coumadin 9mg  daily.  Will check PT/INR on 12/05/13.  I left VM with Basilia Jumbo, CMA at Dr. Maryjean Ka office to inform her that Dr. Benay Spice agrees with anticoagulation plan.  **Coumadin clinic pharmacist will call pt on 12/04/13 to remind her of this appt on 9/18.**

## 2013-11-25 ENCOUNTER — Ambulatory Visit: Payer: Medicare Other | Admitting: Podiatry

## 2013-12-04 ENCOUNTER — Telehealth: Payer: Self-pay

## 2013-12-04 NOTE — Telephone Encounter (Signed)
Called [name] in response to a voicemail she had left requesting to reschedule her Coumadin Clinic appointment that was originally scheduled for 9/18.  She stated that her back is hurting from her recent procedure, and requested her appointment be changed to Monday, 9/21.  (Of note, for her recent procedure, her Coumadin was held for 5 days prior to procedure and resumed the evening of surgery which was 9/14).  She reports no issues with her Coumadin. No bleeding and/or unusual bleeding per patient report.  We will reschedule her appointment for 9/21, with lab at 9:30 am and Coumadin Clinic at 9:45 am per patient request.

## 2013-12-05 ENCOUNTER — Other Ambulatory Visit: Payer: Medicare Other

## 2013-12-05 ENCOUNTER — Ambulatory Visit: Payer: Medicare Other

## 2013-12-08 ENCOUNTER — Other Ambulatory Visit (HOSPITAL_BASED_OUTPATIENT_CLINIC_OR_DEPARTMENT_OTHER): Payer: Medicare Other

## 2013-12-08 ENCOUNTER — Ambulatory Visit (HOSPITAL_BASED_OUTPATIENT_CLINIC_OR_DEPARTMENT_OTHER): Payer: Self-pay | Admitting: Pharmacist

## 2013-12-08 DIAGNOSIS — I82409 Acute embolism and thrombosis of unspecified deep veins of unspecified lower extremity: Secondary | ICD-10-CM

## 2013-12-08 DIAGNOSIS — I82509 Chronic embolism and thrombosis of unspecified deep veins of unspecified lower extremity: Secondary | ICD-10-CM

## 2013-12-08 DIAGNOSIS — I82403 Acute embolism and thrombosis of unspecified deep veins of lower extremity, bilateral: Secondary | ICD-10-CM

## 2013-12-08 LAB — PROTIME-INR
INR: 1.8 — ABNORMAL LOW (ref 2.00–3.50)
Protime: 21.6 Seconds — ABNORMAL HIGH (ref 10.6–13.4)

## 2013-12-08 LAB — POCT INR: INR: 1.8

## 2013-12-08 NOTE — Patient Instructions (Signed)
INR right at [place] Continue Coumadin 9 mg daily this week.  Branch Block procedure again on 12/18/13.  Stop coumadin on 12/13/13 (no lovenox needed).  Restart coumadin night of procedure on 10/1.  Recheck INR on 12/26/13; Lab at 9:00 am, Coumadin clinic at 9:15 am

## 2013-12-08 NOTE — Progress Notes (Signed)
INR right at [place] Pt is doing well today with no complaints Pt recently had branch block procedure performed on 9/14. Pt was off coumadin x 5 days and resumed on 9/14 after procedure. INR trending towards [place] (only 7 doses since resuming) No unusual bleeding or bruising to report No missed or extra doses this week No other medication or diet changes Ms. Ipock states she has another branch block planned for 12/18/13 with the same anticoagulation plan set up She will stop coumadin 5 days prior to procedure (Saturday 12/13/13) with no lovenox bridge required.  She will resume coumadin again the night of the procedure (12/18/13) Plan: Continue Coumadin 9 mg daily this week.  Branch Block procedure again on 12/18/13.  Stop coumadin on 12/13/13 (no lovenox needed).  Restart coumadin night of procedure on 10/1.  Recheck INR on 12/26/13; Lab at 9:00 am, Coumadin clinic at 9:15 am  Coumadin samples:  Coumadin 5 mg tab x 40 tabs LOT: [id number] Exp: 01/2015

## 2013-12-11 ENCOUNTER — Encounter: Payer: Self-pay | Admitting: Podiatry

## 2013-12-11 ENCOUNTER — Ambulatory Visit (INDEPENDENT_AMBULATORY_CARE_PROVIDER_SITE_OTHER): Payer: Medicare Other | Admitting: Podiatry

## 2013-12-11 DIAGNOSIS — M79676 Pain in unspecified toe(s): Secondary | ICD-10-CM

## 2013-12-11 DIAGNOSIS — B351 Tinea unguium: Secondary | ICD-10-CM

## 2013-12-11 DIAGNOSIS — M79609 Pain in unspecified limb: Secondary | ICD-10-CM

## 2013-12-12 ENCOUNTER — Other Ambulatory Visit: Payer: Medicare Other

## 2013-12-12 ENCOUNTER — Ambulatory Visit: Payer: Medicare Other

## 2013-12-12 NOTE — Progress Notes (Signed)
She presents today chief complaint of painful elongated toenails. She denies fever chills nausea vomiting muscle aches pains.  Objective: Nails are thick yellow dystrophic with mycotic. Pulses are palpable.  Assessment: Pain in limb second onychomycosis.  Plan: Debridement of nails 1 through 5 bilateral.

## 2013-12-16 NOTE — Telephone Encounter (Addendum)
Call [phone] for anticoagulation plan

## 2013-12-25 ENCOUNTER — Telehealth: Payer: Self-pay | Admitting: Pharmacist

## 2013-12-25 NOTE — Telephone Encounter (Signed)
Pt called and wanted to change her lab and CC apt time on 12/26/13 from 9/9:15am to Lab at 10:30am and CC at 10:45am.

## 2013-12-26 ENCOUNTER — Ambulatory Visit (HOSPITAL_BASED_OUTPATIENT_CLINIC_OR_DEPARTMENT_OTHER): Payer: Medicare Other | Admitting: Pharmacist

## 2013-12-26 ENCOUNTER — Ambulatory Visit (HOSPITAL_BASED_OUTPATIENT_CLINIC_OR_DEPARTMENT_OTHER): Payer: Medicare Other

## 2013-12-26 DIAGNOSIS — I82403 Acute embolism and thrombosis of unspecified deep veins of lower extremity, bilateral: Secondary | ICD-10-CM

## 2013-12-26 LAB — CBC WITH DIFFERENTIAL/PLATELET
BASO%: 0.2 % (ref 0.0–2.0)
Basophils Absolute: 0 10*3/uL (ref 0.0–0.1)
EOS%: 0.2 % (ref 0.0–7.0)
Eosinophils Absolute: 0 10*3/uL (ref 0.0–0.5)
HCT: 37.3 % (ref 34.8–46.6)
HGB: 12.2 g/dL (ref 11.6–15.9)
LYMPH%: 28.3 % (ref 14.0–49.7)
MCH: 27.3 pg (ref 25.1–34.0)
MCHC: 32.7 g/dL (ref 31.5–36.0)
MCV: 83.4 fL (ref 79.5–101.0)
MONO#: 0.4 10*3/uL (ref 0.1–0.9)
MONO%: 7.3 % (ref 0.0–14.0)
NEUT#: 3.9 10*3/uL (ref 1.5–6.5)
NEUT%: 64 % (ref 38.4–76.8)
Platelets: 319 10*3/uL (ref 145–400)
RBC: 4.47 10*6/uL (ref 3.70–5.45)
RDW: 17.1 % — ABNORMAL HIGH (ref 11.2–14.5)
WBC: 6 10*3/uL (ref 3.9–10.3)
lymph#: 1.7 10*3/uL (ref 0.9–3.3)
nRBC: 0 % (ref 0–0)

## 2013-12-26 LAB — POCT INR: INR: 1.5

## 2013-12-26 LAB — PROTIME-INR
INR: 1.5 — ABNORMAL LOW (ref 2.00–3.50)
Protime: 18 Seconds — ABNORMAL HIGH (ref 10.6–13.4)

## 2013-12-26 NOTE — Progress Notes (Signed)
INR below [place] today. CBC is WNL today. Pt held coumadin for 5 days prior to procedure on 10/1. Coumadin resumed on 10/1.  No missed doses of coumadin since resumed on 10/1. No problems or concerns regarding anticoagulation.  No unusual bruising or bleeding noted. No s/s of clotting noted. No medication changes.  Pt has started to drink Equate Ultra Weight Loss Shakes from [place].  These don't contain Vitamin K. Take 10mg  today. On 12/27/13, continue Coumadin 9mg  daily.  Branch Block procedure again on 01/08/14.  Stop coumadin on 01/03/14 (no lovenox needed).  Stop coumadin 5 days prior to procedure. Restart coumadin night of procedure on 10/22.  Recheck INR on 01/15/14; Lab at 10:00am, Coumadin clinic at 10:15am

## 2013-12-26 NOTE — Patient Instructions (Signed)
Take 10mg  today. On 12/27/13, continue Coumadin 9mg  daily.  Branch Block procedure again on 01/08/14.  Stop coumadin on 01/03/14 (no lovenox needed).  Restart coumadin night of procedure on 10/22.  Recheck INR on 01/15/14; Lab at 10:00am, Coumadin clinic at 10:15am

## 2014-01-15 ENCOUNTER — Ambulatory Visit: Payer: Medicare Other

## 2014-01-15 ENCOUNTER — Other Ambulatory Visit: Payer: Medicare Other

## 2014-01-21 ENCOUNTER — Telehealth: Payer: Self-pay | Admitting: Pharmacist

## 2014-01-21 NOTE — Telephone Encounter (Signed)
Pt called [facility] inquiring when her next Coumadin clinic appt is. She was supposed to come 10/29 but did not come. Pt stated today over phone she did not have the inj in her back.  When she was called & informed she was not getting the inj, she went back on her Coumadin.  She is taking Coumadin 9 mg nightly. She requested appt for lab/CC on 11/6 since her mother has appt same day. Her back inj has been resched for 02/05/14.  She will need to come off Coumadin 5 days prior to the procedure.  We will need to go over instructions w/ her at the 11/6 visit. Kennith Center, Pharm.D., CPP 01/21/2014@3 :50 PM

## 2014-01-22 ENCOUNTER — Other Ambulatory Visit: Payer: Self-pay | Admitting: Oncology

## 2014-01-22 DIAGNOSIS — I82403 Acute embolism and thrombosis of unspecified deep veins of lower extremity, bilateral: Secondary | ICD-10-CM

## 2014-01-23 ENCOUNTER — Ambulatory Visit (HOSPITAL_BASED_OUTPATIENT_CLINIC_OR_DEPARTMENT_OTHER): Payer: Medicare Other | Admitting: Pharmacist

## 2014-01-23 ENCOUNTER — Other Ambulatory Visit (HOSPITAL_BASED_OUTPATIENT_CLINIC_OR_DEPARTMENT_OTHER): Payer: Medicare Other

## 2014-01-23 DIAGNOSIS — I82403 Acute embolism and thrombosis of unspecified deep veins of lower extremity, bilateral: Secondary | ICD-10-CM

## 2014-01-23 LAB — PROTIME-INR
INR: 1.4 — ABNORMAL LOW (ref 2.00–3.50)
Protime: 16.8 Seconds — ABNORMAL HIGH (ref 10.6–13.4)

## 2014-01-23 LAB — POCT INR: INR: 1.4

## 2014-01-23 NOTE — Progress Notes (Signed)
INR below [place] again today Ms. Huhn is doing well She reports no issues She continues to receive injections for her nerve block requiring her to stop her coumadin prior to her procedures Her appointment on 10/22 was canceled for some reason She is scheduled for her next inj on 11/18 and will stop coumadin starting 11/14 Pt restarted coumadin on 10/21 when she found out she was not having her procedure No missed or extra doses since then No unusual bleeding or bruising No diet or medication changes Plan: Increase slightly to coumadin 10mg  daily.  Branch Block procedure again on 02/04/14. Stop coumadin on 01/31/14 (no lovenox needed).  Restart coumadin night of procedure on 10/18. Recheck INR on 02/18/14; Lab at 10:00am, Coumadin clinic at 10:15am

## 2014-01-23 NOTE — Patient Instructions (Signed)
INR below [place] Take 10mg  daily.  Branch Block procedure again on 02/04/14.  Stop coumadin on 01/31/14 (no lovenox needed).  Restart coumadin night of procedure on 10/18. Recheck INR on 02/18/14; Lab at 10:00am, Coumadin clinic at 10:15am

## 2014-02-09 ENCOUNTER — Emergency Department (HOSPITAL_COMMUNITY): Payer: Medicare Other

## 2014-02-09 ENCOUNTER — Emergency Department (HOSPITAL_COMMUNITY)
Admission: EM | Admit: 2014-02-09 | Discharge: 2014-02-09 | Disposition: A | Discharge status: Home / Self Care | Payer: Medicare Other | Attending: Emergency Medicine | Admitting: Emergency Medicine

## 2014-02-09 ENCOUNTER — Encounter (HOSPITAL_COMMUNITY): Payer: Self-pay | Admitting: Emergency Medicine

## 2014-02-09 DIAGNOSIS — Z7951 Long term (current) use of inhaled steroids: Secondary | ICD-10-CM | POA: Insufficient documentation

## 2014-02-09 DIAGNOSIS — E278 Other specified disorders of adrenal gland: Secondary | ICD-10-CM

## 2014-02-09 DIAGNOSIS — F329 Major depressive disorder, single episode, unspecified: Secondary | ICD-10-CM | POA: Insufficient documentation

## 2014-02-09 DIAGNOSIS — D3501 Benign neoplasm of right adrenal gland: Secondary | ICD-10-CM | POA: Diagnosis not present

## 2014-02-09 DIAGNOSIS — M1389 Other specified arthritis, multiple sites: Secondary | ICD-10-CM | POA: Diagnosis not present

## 2014-02-09 DIAGNOSIS — R011 Cardiac murmur, unspecified: Secondary | ICD-10-CM | POA: Diagnosis not present

## 2014-02-09 DIAGNOSIS — Z7901 Long term (current) use of anticoagulants: Secondary | ICD-10-CM | POA: Insufficient documentation

## 2014-02-09 DIAGNOSIS — Z79899 Other long term (current) drug therapy: Secondary | ICD-10-CM | POA: Diagnosis not present

## 2014-02-09 DIAGNOSIS — R918 Other nonspecific abnormal finding of lung field: Secondary | ICD-10-CM | POA: Insufficient documentation

## 2014-02-09 DIAGNOSIS — G4730 Sleep apnea, unspecified: Secondary | ICD-10-CM | POA: Diagnosis not present

## 2014-02-09 DIAGNOSIS — J449 Chronic obstructive pulmonary disease, unspecified: Secondary | ICD-10-CM | POA: Diagnosis not present

## 2014-02-09 DIAGNOSIS — Z9981 Dependence on supplemental oxygen: Secondary | ICD-10-CM | POA: Insufficient documentation

## 2014-02-09 DIAGNOSIS — E119 Type 2 diabetes mellitus without complications: Secondary | ICD-10-CM | POA: Diagnosis not present

## 2014-02-09 DIAGNOSIS — I10 Essential (primary) hypertension: Secondary | ICD-10-CM | POA: Diagnosis not present

## 2014-02-09 DIAGNOSIS — Z86718 Personal history of other venous thrombosis and embolism: Secondary | ICD-10-CM | POA: Insufficient documentation

## 2014-02-09 DIAGNOSIS — M797 Fibromyalgia: Secondary | ICD-10-CM | POA: Insufficient documentation

## 2014-02-09 DIAGNOSIS — Z3202 Encounter for pregnancy test, result negative: Secondary | ICD-10-CM | POA: Insufficient documentation

## 2014-02-09 DIAGNOSIS — Z862 Personal history of diseases of the blood and blood-forming organs and certain disorders involving the immune mechanism: Secondary | ICD-10-CM | POA: Insufficient documentation

## 2014-02-09 DIAGNOSIS — R109 Unspecified abdominal pain: Secondary | ICD-10-CM

## 2014-02-09 DIAGNOSIS — R1030 Lower abdominal pain, unspecified: Secondary | ICD-10-CM | POA: Diagnosis present

## 2014-02-09 DIAGNOSIS — R911 Solitary pulmonary nodule: Secondary | ICD-10-CM

## 2014-02-09 LAB — COMPREHENSIVE METABOLIC PANEL
ALT: 25 U/L (ref 0–35)
AST: 15 U/L (ref 0–37)
Albumin: 3.9 g/dL (ref 3.5–5.2)
Alkaline Phosphatase: 86 U/L (ref 39–117)
Anion gap: 16 — ABNORMAL HIGH (ref 5–15)
BUN: 28 mg/dL — ABNORMAL HIGH (ref 6–23)
CO2: 26 mEq/L (ref 19–32)
Calcium: 10.2 mg/dL (ref 8.4–10.5)
Chloride: 97 mEq/L (ref 96–112)
Creatinine, Ser: 0.88 mg/dL (ref 0.50–1.10)
GFR calc Af Amer: 82 mL/min — ABNORMAL LOW (ref >90)
GFR calc non Af Amer: 71 mL/min — ABNORMAL LOW (ref >90)
Glucose, Bld: 122 mg/dL — ABNORMAL HIGH (ref 70–99)
Potassium: 3.3 mEq/L — ABNORMAL LOW (ref 3.7–5.3)
Sodium: 139 mEq/L (ref 137–147)
Total Bilirubin: 0.3 mg/dL (ref 0.3–1.2)
Total Protein: 8.4 g/dL — ABNORMAL HIGH (ref 6.0–8.3)

## 2014-02-09 LAB — CBC WITH DIFFERENTIAL/PLATELET
Basophils Absolute: 0 10*3/uL (ref 0.0–0.1)
Basophils Relative: 0 % (ref 0–1)
Eosinophils Absolute: 0 10*3/uL (ref 0.0–0.7)
Eosinophils Relative: 0 % (ref 0–5)
HCT: 38.9 % (ref 36.0–46.0)
Hemoglobin: 12.9 g/dL (ref 12.0–15.0)
Lymphocytes Relative: 28 % (ref 12–46)
Lymphs Abs: 2.1 10*3/uL (ref 0.7–4.0)
MCH: 27 pg (ref 26.0–34.0)
MCHC: 33.2 g/dL (ref 30.0–36.0)
MCV: 81.6 fL (ref 78.0–100.0)
Monocytes Absolute: 0.5 10*3/uL (ref 0.1–1.0)
Monocytes Relative: 6 % (ref 3–12)
Neutro Abs: 4.7 10*3/uL (ref 1.7–7.7)
Neutrophils Relative %: 66 % (ref 43–77)
Platelets: 329 10*3/uL (ref 150–400)
RBC: 4.77 MIL/uL (ref 3.87–5.11)
RDW: 16.9 % — ABNORMAL HIGH (ref 11.5–15.5)
WBC: 7.3 10*3/uL (ref 4.0–10.5)

## 2014-02-09 LAB — URINALYSIS, ROUTINE W REFLEX MICROSCOPIC
Glucose, UA: NEGATIVE mg/dL
Hgb urine dipstick: NEGATIVE
Ketones, ur: NEGATIVE mg/dL
Leukocytes, UA: NEGATIVE
Nitrite: NEGATIVE
Protein, ur: 30 mg/dL — AB
Specific Gravity, Urine: 1.028 (ref 1.005–1.030)
Urobilinogen, UA: 0.2 mg/dL (ref 0.0–1.0)
pH: 5.5 (ref 5.0–8.0)

## 2014-02-09 LAB — URINE MICROSCOPIC-ADD ON

## 2014-02-09 LAB — PREGNANCY, URINE: Preg Test, Ur: NEGATIVE

## 2014-02-09 LAB — LIPASE, BLOOD: Lipase: 22 U/L (ref 11–59)

## 2014-02-09 MED ORDER — MORPHINE SULFATE 2 MG/ML IJ SOLN
2.0000 mg | Freq: Once | INTRAMUSCULAR | Status: AC
Start: 2014-02-09 — End: 2014-02-09
  Administered 2014-02-09: 2 mg via INTRAVENOUS
  Filled 2014-02-09: qty 1

## 2014-02-09 MED ORDER — OXYCODONE-ACETAMINOPHEN 5-325 MG PO TABS: 1.0000 | ORAL_TABLET | ORAL | Status: DC | PRN

## 2014-02-09 MED ORDER — SODIUM CHLORIDE 0.9 % IV BOLUS (SEPSIS)
1000.0000 mL | Freq: Once | INTRAVENOUS | Status: AC
  Administered 2014-02-09: 1000 mL via INTRAVENOUS

## 2014-02-09 MED ORDER — ONDANSETRON HCL 4 MG/2ML IJ SOLN
4.0000 mg | Freq: Once | INTRAMUSCULAR | Status: AC
  Administered 2014-02-09: 4 mg via INTRAVENOUS
  Filled 2014-02-09: qty 2

## 2014-02-09 MED ORDER — KETOROLAC TROMETHAMINE 30 MG/ML IJ SOLN
30.0000 mg | Freq: Once | INTRAMUSCULAR | Status: AC
  Administered 2014-02-09: 30 mg via INTRAVENOUS
  Filled 2014-02-09: qty 1

## 2014-02-09 NOTE — ED Notes (Signed)
Bed: WLPT2 Expected date:  Expected time:  Means of arrival:  Comments: Ems  

## 2014-02-09 NOTE — ED Notes (Addendum)
Pt reports RLQ that began Friday night and has gotten progressively worse over the weekend. Pt has a history of the same a month ago and a year ago. Pain radiates to R medial leg. No N/v/d, pt reports flare-ups get worse each time.

## 2014-02-09 NOTE — ED Notes (Signed)
Patient unable to void at this time

## 2014-02-09 NOTE — ED Provider Notes (Signed)
CSN: [id number]     Arrival date & time 02/09/14  1130 History   First MD Initiated Contact with Patient 02/09/14 1253     Chief Complaint  Patient presents with  . Abdominal Pain     (Consider location/radiation/quality/duration/timing/severity/associated sxs/prior Treatment) HPI Patient complaining of pain right groin present for 2 days.  Pain is sharp and continuous.  She denies associated symptoms of fever, chills, nausea, vomiting, dysuria, hematuria, change in bowel habits, lower extremity swelling, or vaginal discharge.  She has ahd a similar pain several times in the past but states she did not receive a diagnosis.   Past Medical History  Diagnosis Date  . DVT (deep venous thrombosis)     Recurrent  . Adrenal mass 03/226    Benign  . Lupus Anticoagulant Positive   . Clotting disorder     +beta-2-glycoprotein IgA antibody  . History of cocaine abuse     Remote history   . Heart murmur   . Hypertension     takes meds daily  . Depression   . Shortness of breath     exertion or lying flat  . 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 Sleep apnea     2l of oxygen at night  . On home oxygen therapy     at night  . Diabetes mellitus     120s usually fasting  . GERD (gastroesophageal reflux disease)   . Dizziness, nonspecific   . Arthritis     knees/multiple orthopedic conditons; lower back  . History of blood transfusion   . Fibromyalgia    Past Surgical History  Procedure Laterality Date  . Carpal tunnel release      Bilateral  . Abdominal hysterectomy  1989    Fibroids  . Back surgery      cervical spine---disk disease  . Cesarean section    . Replacement total knee  11/17/2008    bilateral  . Right elbow surgery    . Tubal ligation    . Gallstones removed    . Anterior lumbar fusion  01/03/2012    Procedure: ANTERIOR LUMBAR FUSION 1 LEVEL;  Surgeon: Winfield Cunas, MD;  Location: [facility];  Service:  Neurosurgery;  Laterality: N/A;  Lumbar Four-Five Anterior Lumbar Interbody Fusion with Instrumentation   Family History  Problem Relation Age of Onset  . Hypertension Father   . Heart failure Father   . Hypertension Mother   . Diabetes Mother   . Cancer Mother     Pancreatic   History  Substance Use Topics  . Smoking status: Never Smoker   . Smokeless tobacco: Never Used  . Alcohol Use: No   OB History    No data available     Review of Systems  All other systems reviewed and are negative.     Allergies  Corticosteroids  Home Medications   Prior to Admission medications   Medication Sig Start Date End Date Taking? Authorizing Provider  albuterol (PROVENTIL HFA;VENTOLIN HFA) 108 (90 BASE) MCG/ACT inhaler Inhale 2 puffs into the lungs every 4 (four) hours as needed. For shortness of breath   Yes Historical Provider, MD  amLODipine (NORVASC) 5 MG tablet Take 5 mg by mouth daily.    Yes Historical Provider, MD  budesonide-formoterol (SYMBICORT) 80-4.5 MCG/ACT inhaler Inhale 2 puffs into the lungs 2 (two) times daily. 08/21/13  Yes Estela Leonie Green, MD  cloNIDine (CATAPRES) 0.1 MG tablet  Take 0.1 mg by mouth 2 (two) times daily.    Yes Historical Provider, MD  diltiazem (CARDIZEM CD) 180 MG 24 hr capsule Take 180 mg by mouth daily.    Yes Historical Provider, MD  DULoxetine (CYMBALTA) 60 MG capsule Take 60 mg by mouth daily.    Yes Historical Provider, MD  ferrous sulfate 325 (65 FE) MG tablet Take 1 tablet (325 mg total) by mouth 2 (two) times daily with a meal. 08/21/13  Yes Estela Leonie Green, MD  gabapentin (NEURONTIN) 300 MG capsule Take 300 mg by mouth Three times a day.  11/24/11  Yes Historical Provider, MD  hydrochlorothiazide (MICROZIDE) 12.5 MG capsule Take 12.5 mg by mouth daily.   Yes Historical Provider, MD  HYDROcodone-acetaminophen (NORCO) 7.5-325 MG per tablet Take 1 tablet by mouth every 6 (six) hours as needed for moderate pain. 08/21/13  Yes Erline Hau, MD  lisinopril (PRINIVIL,ZESTRIL) 5 MG tablet Take 1 tablet (5 mg total) by mouth daily. 08/21/13  Yes Erline Hau, MD  loratadine (CLARITIN) 10 MG tablet Take 10 mg by mouth daily as needed. For allergies.   Yes Historical Provider, MD  metFORMIN (GLUCOPHAGE) 500 MG tablet Take 500 mg by mouth at bedtime.    Yes Historical Provider, MD  omeprazole (PRILOSEC) 20 MG capsule Take 20 mg by mouth 2 (two) times daily.   Yes Historical Provider, MD  orphenadrine (NORFLEX) 100 MG tablet Take 1 tablet (100 mg total) by mouth 2 (two) times daily. 02/17/13  Yes Elisha Headland, NP  polyethylene glycol (MIRALAX / GLYCOLAX) packet Take 17 g by mouth daily.    Yes Historical Provider, MD  pravastatin (PRAVACHOL) 40 MG tablet Take 1 tablet by mouth daily. 02/18/12  Yes Historical Provider, MD  QUEtiapine (SEROQUEL) 50 MG tablet Take 50 mg by mouth 2 (two) times daily.    Yes Historical Provider, MD  Rub[employer] Goods (ENEMA BOTTLE) MISC Place 1 Bottle rectally as needed.   Yes Historical Provider, MD  traMADol (ULTRAM) 50 MG tablet Take 100 mg by mouth every 6 (six) hours as needed for moderate pain.   Yes Historical Provider, MD  warfarin (COUMADIN) 10 MG tablet Take 1 tablet (10 mg total) by mouth daily. 08/21/13  Yes Erline Hau, MD  warfarin (COUMADIN) 4 MG tablet TAKE 1 TABLET BY MOUTH ONCE DAILY WITH 5 MG TABLETS AS DIRECTED 01/22/14   Ladell Pier, MD   BP 176/97 mmHg  Pulse 84  Temp(Src) 97.5 F (36.4 C) (Oral)  Resp 17  SpO2 96% Physical Exam  Constitutional: She is oriented to person, place, and time. She appears well-developed and well-nourished.  HENT:  Head: Normocephalic and atraumatic.  Right Ear: External ear normal.  Left Ear: External ear normal.  Eyes: Conjunctivae and EOM are normal. Pupils are equal, round, and reactive to light.  Neck: Normal range of motion. Neck supple.  Cardiovascular: Normal rate, regular rhythm, normal heart sounds and intact  distal pulses.   Pulmonary/Chest: Effort normal and breath sounds normal.  Abdominal: Soft. Bowel sounds are normal. There is tenderness.    Musculoskeletal: Normal range of motion.  Neurological: She is alert and oriented to person, place, and time. She has normal reflexes.  Skin: Skin is warm and dry.  Psychiatric: She has a normal mood and affect. Her behavior is normal. Judgment and thought content normal.  Nursing note and vitals reviewed.   ED Course  Procedures (including critical care time) Labs  Review Labs Reviewed  CBC WITH DIFFERENTIAL - Abnormal; Notable for the following:    RDW 16.9 (*)    All other components within normal limits  COMPREHENSIVE METABOLIC PANEL - Abnormal; Notable for the following:    Potassium 3.3 (*)    Glucose, Bld 122 (*)    BUN 28 (*)    Total Protein 8.4 (*)    GFR calc non Af Amer 71 (*)    GFR calc Af Amer 82 (*)    Anion gap 16 (*)    All other components within normal limits  LIPASE, BLOOD  URINALYSIS, ROUTINE W REFLEX MICROSCOPIC  PREGNANCY, URINE    Imaging Review Ct Abdomen Pelvis Wo Contrast  02/09/2014   CLINICAL DATA:  Progressive right lower quadrant and flank pain  EXAM: CT ABDOMEN AND PELVIS WITHOUT CONTRAST  TECHNIQUE: Multidetector CT imaging of the abdomen and pelvis was performed following the standard protocol without oral or intravenous contrast material administration.  COMPARISON:  January 29, 2012  FINDINGS: On axial slice 8 series 4, there is a 5 mm nodular opacity in the posterior segment of the right lower lobe. Lung bases are otherwise clear.  Liver is prominent measuring 17.7 cm in length. There is hepatic steatosis. There is a 4 mm cyst in the left lobe of the liver, medial segment anteriorly. No other focal liver lesions are appreciable on this noncontrast enhanced study. Gallbladder is absent. There is no appreciable biliary duct dilatation.  Spleen, pancreas, and left adrenal appear normal. There is an apparent  adenoma in the right adrenal measuring 2.6 by 2.2 cm.  Kidneys bilaterally show no evidence of mass or hydronephrosis on either side. There is no renal or ureteral calculus on either side. There are vascular phleboliths medial to the lower pole left kidney.  There is a small ventral hernia containing only fat. There is some scarring in the periumbilical region.  In the pelvis, the urinary bladder is midline with normal wall thickness. Uterus is absent. There is no pelvic mass or fluid. Appendix appears normal.  There is no bowel obstruction. No free air or portal venous air. There is no appreciable ascites, adenopathy, or abscess in the abdomen or pelvis. There is no demonstrable abdominal aortic aneurysm. There is postoperative change in the lumbar spine with fusion at L4 and L5. There are no blastic or lytic bone lesions.  IMPRESSION: No renal or ureteral calculus. No hydronephrosis. No appendiceal or periappendiceal region inflammation.  No bowel obstruction.  No abscess.  Liver prominent with hepatic steatosis.  5 mm nodular opacity posterior right lung base. Followup of this nodular opacity should be based on Fleischner Society guidelines. If the patient is at high risk for bronchogenic carcinoma, follow-up chest CT at 6-12 months is recommended. If the patient is at low risk for bronchogenic carcinoma, follow-up chest CT at 12 months is recommended. This recommendation follows the consensus statement: Guidelines for Management of Small Pulmonary Nodules Detected on CT Scans: A Statement from the [place] as published in Radiology 2005;237:395-400.  Stable right adrenal adenoma.  Uterus absent.   Electronically Signed   By: Lowella Grip M.D.   On: 02/09/2014 13:48     EKG Interpretation None      MDM   Final diagnoses:  Right flank pain    58 y.o. Female on coumadin for dvt presents complaining of right flank pain and right groin pain.  Work up here negative for ureteral stone,  appendicitis, bowel obstruction or other  acute intraabdominal pathology.  Patient with surgically absent gallbladder. No evidence of aortic aneurysm.  There a ventral hernia with fat but no apparent obstruction.  5 mm opacity righ lung base- patient advised. Probable right adrenal adenoma  Patient improved.  Advised regarding return precautions and need for follow up.  ua without evidence of infectio but appears to have some contamination. Discussed results with patient and advised regarding need for follow up and return precautions and voices understanding.     Shaune Pollack, MD 02/09/14 [phone]

## 2014-02-09 NOTE — Discharge Instructions (Signed)
Please follow up with your primary doctor for recheck of nodules of lungs and adrenal mass.  REturn if pain worsens or does not resolve in the next 12-24 hours.   Abdominal Pain Many things can cause abdominal pain. Usually, abdominal pain is not caused by a disease and will improve without treatment. It can often be observed and treated at home. Your health care provider will do a physical exam and possibly order blood tests and X-rays to help determine the seriousness of your pain. However, in many cases, more time must pass before a clear cause of the pain can be found. Before that point, your health care provider may not know if you need more testing or further treatment. HOME CARE INSTRUCTIONS  Monitor your abdominal pain for any changes. The following actions may help to alleviate any discomfort you are experiencing:  Only take over-the-counter or prescription medicines as directed by your health care provider.  Do not take laxatives unless directed to do so by your health care provider.  Try a clear liquid diet (broth, tea, or water) as directed by your health care provider. Slowly move to a bland diet as tolerated. SEEK MEDICAL CARE IF:  You have unexplained abdominal pain.  You have abdominal pain associated with nausea or diarrhea.  You have pain when you urinate or have a bowel movement.  You experience abdominal pain that wakes you in the night.  You have abdominal pain that is worsened or improved by eating food.  You have abdominal pain that is worsened with eating fatty foods.  You have a fever. SEEK IMMEDIATE MEDICAL CARE IF:   Your pain does not go away within 2 hours.  You keep throwing up (vomiting). Your pain is felt only in portions of the abdomen, such as the right Pulmonary Nodule A pulmonary nodule is a small, round growth of tissue in the lung. Pulmonary nodules can range in size from less than 1/5 inch (4 mm) to a little bigger than an inch (25 mm). Most  pulmonary nodules are detected when imaging tests of the lung are being performed for a different problem. Pulmonary nodules are usually not cancerous (benign). However, some pulmonary nodules are cancerous (malignant). Follow-up treatment or testing is based on the size of the pulmonary nodule and your risk of getting lung cancer.  CAUSES Benign pulmonary nodules can be caused by various things. Some of the causes include:   Bacterial, fungal, or viral infections. This is usually an old infection that is no longer active, but it can sometimes be a current, active infection.  A benign mass of tissue.  Inflammation from conditions such as rheumatoid arthritis.   Abnormal blood vessels in the lungs. Malignant pulmonary nodules can result from lung cancer or from cancers that spread to the lung from other places in the body. SIGNS AND SYMPTOMS Pulmonary nodules usually do not cause symptoms. DIAGNOSIS Most often, pulmonary nodules are found incidentally when an X-Yuna Pizzolato or CT scan is performed to look for some other problem in the lung area. To help determine whether a pulmonary nodule is benign or malignant, your health care provider will take a medical history and order a variety of tests. Tests done may include:   Blood tests.  A skin test called a tuberculin test. This test is used to determine if you have been exposed to the germ that causes tuberculosis.   Chest X-rays. If possible, a new X-Enzo Treu may be compared with X-rays you have had in the  past.   CT scan. This test shows smaller pulmonary nodules more clearly than an X-Clotine Heiner.   Positron emission tomography (PET) scan. In this test, a safe amount of a radioactive substance is injected into the bloodstream. Then, the scan takes a picture of the pulmonary nodule. The radioactive substance is eliminated from your body in your urine.   Biopsy. A tiny piece of the pulmonary nodule is removed so it can be checked under a  microscope. TREATMENT  Pulmonary nodules that are benign normally do not require any treatment because they usually do not cause symptoms or breathing problems. Your health care provider may want to monitor the pulmonary nodule through follow-up CT scans. The frequency of these CT scans will vary based on the size of the nodule and the risk factors for lung cancer. For example, CT scans will need to be done more frequently if the pulmonary nodule is larger and if you have a history of smoking and a family history of cancer. Further testing or biopsies may be done if any follow-up CT scan shows that the size of the pulmonary nodule has increased. HOME CARE INSTRUCTIONS  Only take over-the-counter or prescription medicines as directed by your health care provider.  Keep all follow-up appointments with your health care provider. SEEK MEDICAL CARE IF:  You have trouble breathing when you are active.   You feel sick or unusually tired.   You do not feel like eating.   You lose weight without trying to.   You develop chills or night sweats.  SEEK IMMEDIATE MEDICAL CARE IF:  You cannot catch your breath, or you begin wheezing.   You cannot stop coughing.   You cough up blood.   You become dizzy or feel like you are going to pass out.   You have sudden chest pain.   You have a fever or persistent symptoms for more than 2-3 days.   You have a fever and your symptoms suddenly get worse. MAKE SURE YOU:  Understand these instructions.  Will watch your condition.  Will get help right away if you are not doing well or get worse. Document Released: 01/01/2009 Document Revised: 11/06/2012 Document Reviewed: 08/26/2012 [facility] Patient Information 2015 [place], [place]. This information is not intended to replace advice given to you by your health care provider. Make sure you discuss any questions you have with your health care provider.   side or the left lower portion of the  abdomen.  You pass bloody or black tarry stools. MAKE SURE YOU:  Understand these instructions.   Will watch your condition.   Will get help right away if you are not doing well or get worse.  Document Released: 12/14/2004 Document Revised: 03/11/2013 Document Reviewed: 11/13/2012 [facility] Patient Information 2015 [place], [place]. This information is not intended to replace advice given to you by your health care provider. Make sure you discuss any questions you have with your health care provider.

## 2014-02-10 ENCOUNTER — Telehealth: Payer: Self-pay | Admitting: *Deleted

## 2014-02-10 NOTE — Telephone Encounter (Signed)
Pt called states "I need appt with Dr. Benay Spice asap"  Pt reports she went to ED yesterday for stomach pain and "they found something in my lungs"  Note to Dr. Benay Spice.

## 2014-02-13 ENCOUNTER — Encounter: Payer: Self-pay | Admitting: *Deleted

## 2014-02-13 ENCOUNTER — Telehealth: Payer: Self-pay | Admitting: *Deleted

## 2014-02-13 NOTE — Telephone Encounter (Signed)
Made [name] aware, per Dr. Benay Spice that the 5 mm opacity on CT is likely benign. Her PCP can follow up on this. Suggests repeat CT scan in 6 months (no contrast) is she is a smoker. She reports only smoking for 2 years (2003-2005). Sees her PCP in 2 weeks. Faxed copy of CT to Dr. Lorelle Gibbs office.

## 2014-02-18 ENCOUNTER — Ambulatory Visit: Payer: Medicare Other

## 2014-02-18 ENCOUNTER — Other Ambulatory Visit: Payer: Medicare Other

## 2014-02-20 ENCOUNTER — Ambulatory Visit (HOSPITAL_BASED_OUTPATIENT_CLINIC_OR_DEPARTMENT_OTHER): Payer: Medicare Other

## 2014-02-20 ENCOUNTER — Other Ambulatory Visit (HOSPITAL_BASED_OUTPATIENT_CLINIC_OR_DEPARTMENT_OTHER): Payer: Medicare Other

## 2014-02-20 DIAGNOSIS — I82403 Acute embolism and thrombosis of unspecified deep veins of lower extremity, bilateral: Secondary | ICD-10-CM

## 2014-02-20 LAB — POCT INR: INR: 1.4

## 2014-02-20 LAB — PROTIME-INR
INR: 1.4 — ABNORMAL LOW (ref 2.00–3.50)
Protime: 16.8 Seconds — ABNORMAL HIGH (ref 10.6–13.4)

## 2014-02-20 NOTE — Progress Notes (Addendum)
[name] INR is 1.4 which is below her [place] range of 2-3. She has been taking 10 mg daily as instructed at her last visit, which was higher than her previous dose of 9 mg daily. She did have to hold Coumadin for 5-6 days prior to her procedure on 11/18, and she may be a slow responder following her resumption of Coumadin after her procedure. No medication changes and/or dietary changes, though she states she hasn't had much of an appetite in general. No bleeding and/or unusual bruising. No missed and/or extra doses.  Given that despite a dose increase, her INR remains low, will provide a couple of days of a boosted dose, though still remaining somewhat conservative in case she is just a slower responder to prevent her INR from going too far above [place]. We will recheck her sooner (1 week) to ensure her INR is trending up. If it remains low, we may have to consider bridging her with Lovenox at that time.  Plan: Coumadin 12.5 mg today and tomorrow, then resume 10 mg daily. Return to [facility]madin Clinic on 02/27/14; lab at 10:30 am, Coumadin clinic at 10:45 am

## 2014-02-27 ENCOUNTER — Other Ambulatory Visit (HOSPITAL_BASED_OUTPATIENT_CLINIC_OR_DEPARTMENT_OTHER): Payer: Medicare Other

## 2014-02-27 ENCOUNTER — Ambulatory Visit (HOSPITAL_BASED_OUTPATIENT_CLINIC_OR_DEPARTMENT_OTHER): Payer: Medicare Other | Admitting: Pharmacist

## 2014-02-27 DIAGNOSIS — I82403 Acute embolism and thrombosis of unspecified deep veins of lower extremity, bilateral: Secondary | ICD-10-CM

## 2014-02-27 DIAGNOSIS — I824Y3 Acute embolism and thrombosis of unspecified deep veins of proximal lower extremity, bilateral: Secondary | ICD-10-CM

## 2014-02-27 LAB — POCT INR: INR: 2.5

## 2014-02-27 LAB — PROTIME-INR
INR: 2.5 (ref 2.00–3.50)
Protime: 30 Seconds — ABNORMAL HIGH (ref 10.6–13.4)

## 2014-02-27 NOTE — Progress Notes (Signed)
INR = 2.5 Pt deviated from our recommended 10 mg/day except 12.5 mg x 2 doses at last weeks visit when her INR = 1.4. She took 15 mg x 5 days then took 10 mg x 2 days (95 mg total). No bleeding. No recent med changes. INR now at [facility].  She had low INR at 75 mg/week so I will go between and keep her at 85 mg/week=> 10 mg/day except 15 mg Tu/Th/Sat. She repeated dosing plan with understanding. Return in 2 weeks. Kennith Center, Pharm.D., CPP 02/27/2014@10 :36 AM

## 2014-03-10 ENCOUNTER — Ambulatory Visit: Payer: Medicare Other

## 2014-03-10 ENCOUNTER — Other Ambulatory Visit: Payer: Medicare Other

## 2014-03-12 ENCOUNTER — Other Ambulatory Visit: Payer: Medicare Other

## 2014-03-12 IMAGING — MR MR LUMBAR SPINE W/O CM
4 of 5 series · 28 of 48 positions shown · non-contrast
Comparison: None

CLINICAL DATA: Low back pain and right flank pain.

MRI LUMBAR SPINE WITHOUT CONTRAST
TECHNIQUE: Multiplanar and multiecho pulse sequences of the lumbar
spine were obtained without intravenous contrast.

[Series 3: T2 · sagittal · 4.0mm · 0.55mm/px · 5 of 11 slices shown (1 of 2)]
[im 1/11]
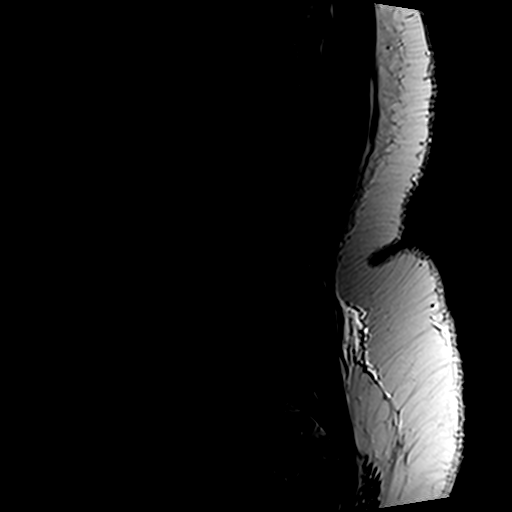
[im 3/11]
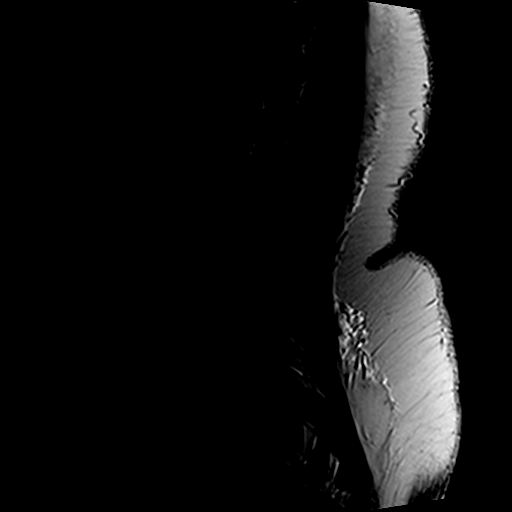
[im 6/11]
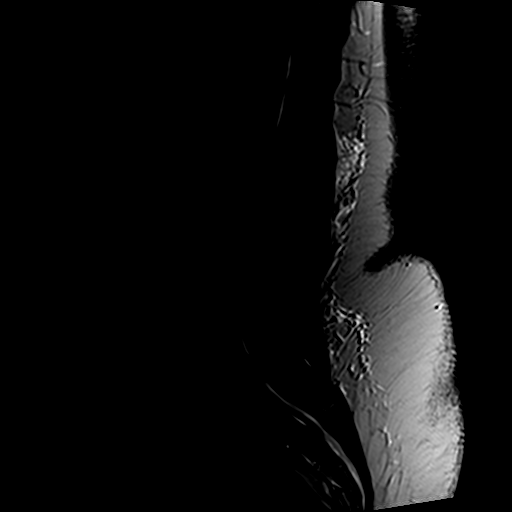
[im 8/11]
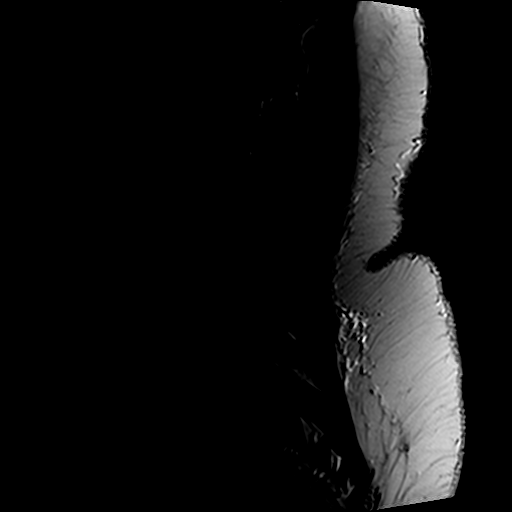
[im 11/11]
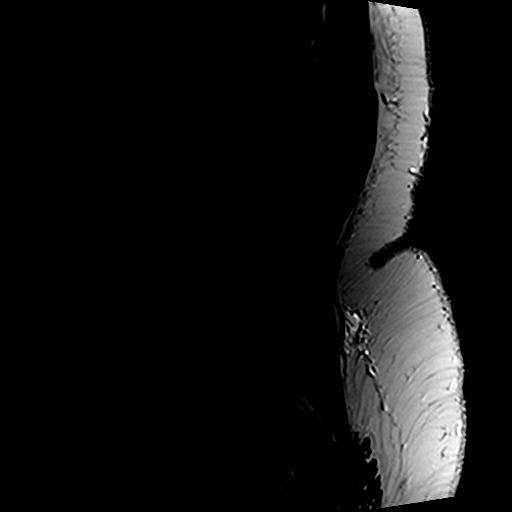

[Series 4: T1 · sagittal · 4.0mm · 0.55mm/px · 5 of 11 slices shown (1 of 2)]
[im 1/11]
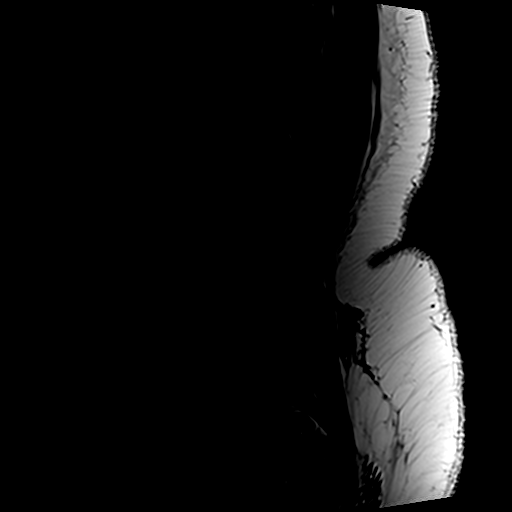
[im 3/11]
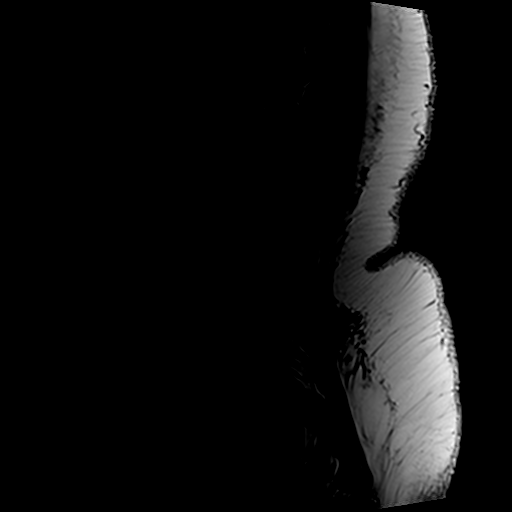
[im 6/11]
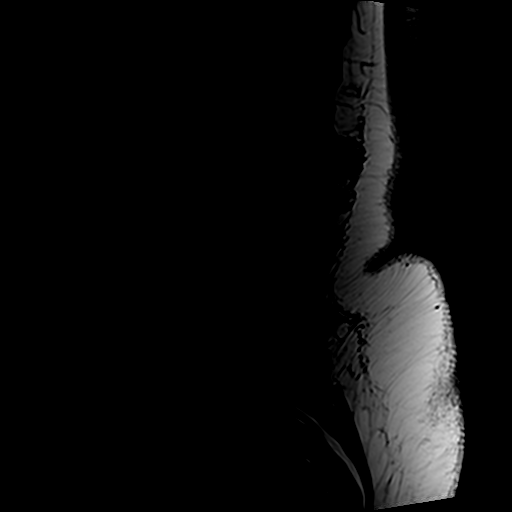
[im 8/11]
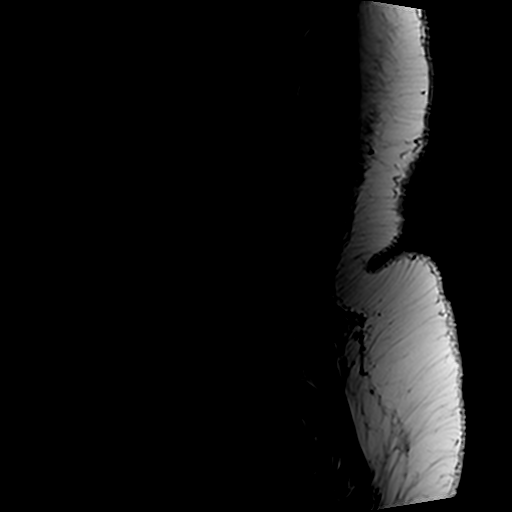
[im 11/11]
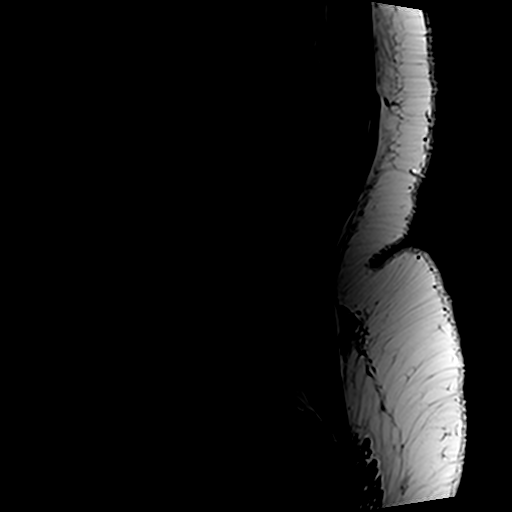

[Series 6: T2 · axial · 4.0mm · 0.70mm/px · z∈[-55,+121]mm · 11 of 36 slices shown (2 of 2)]
[im 3/36]
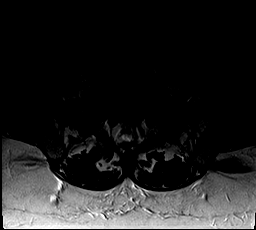
[im 5/36]
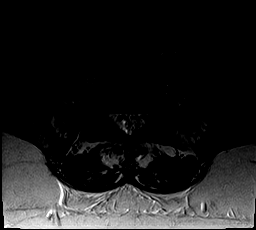
[im 7/36]
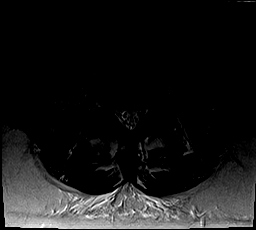
[im 11/36]
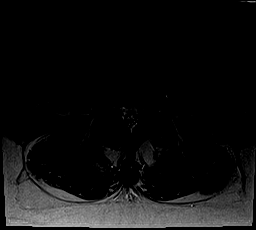
[im 16/36]
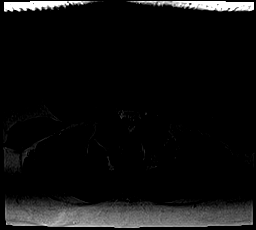
[im 18/36]
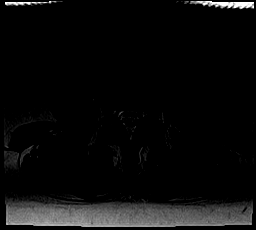
[im 20/36]
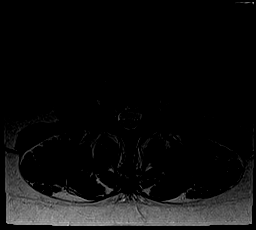
[im 25/36]
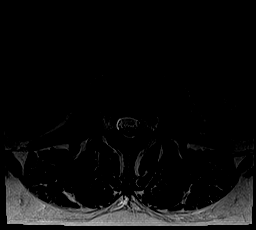
[im 29/36]
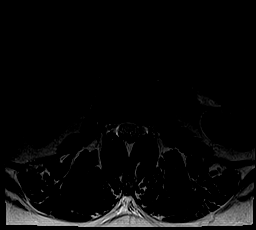
[im 31/36]
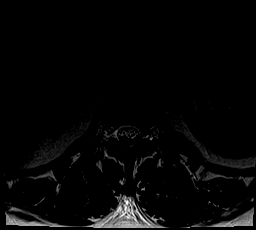
[im 33/36]
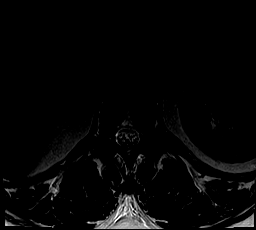

[Series 7: T1 · axial · 4.0mm · 0.35mm/px · z∈[-55,+111]mm · 7 of 35 slices shown (2 of 2)]
[im 3/35]
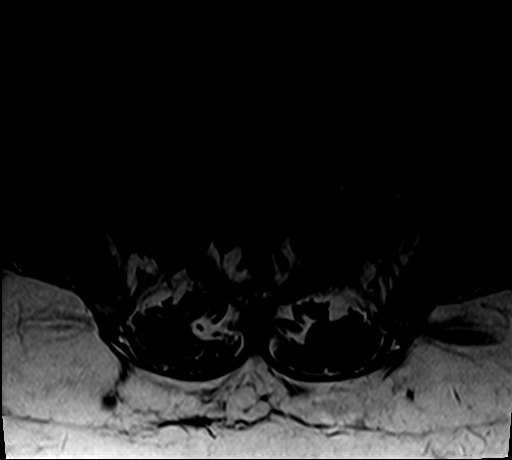
[im 5/35]
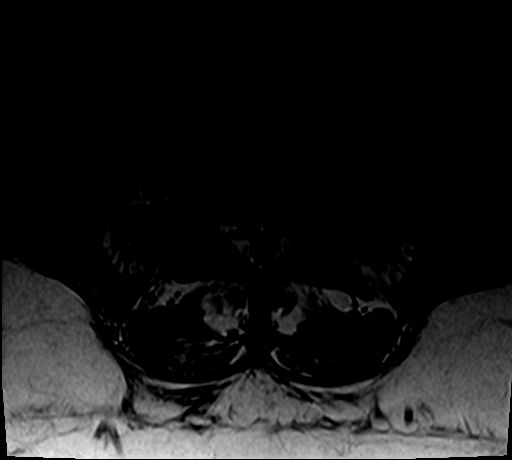
[im 7/35]
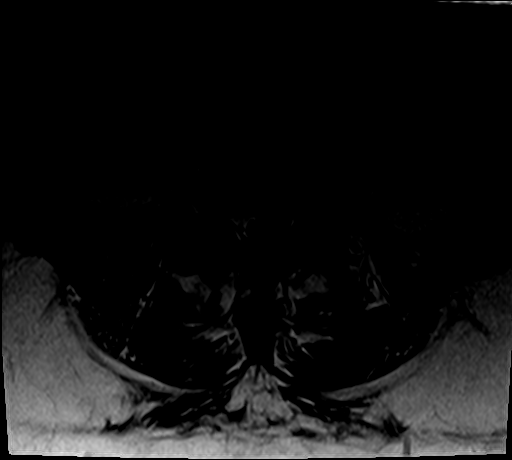
[im 12/35]
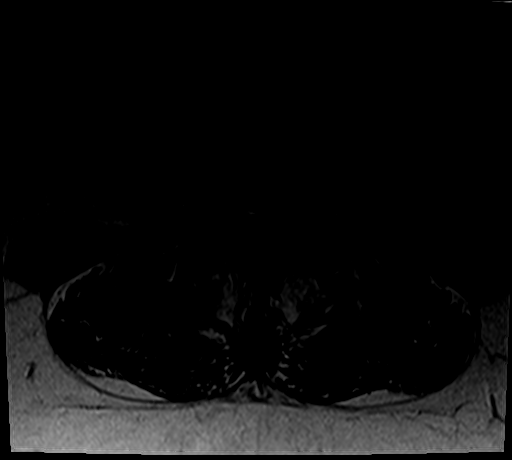
[im 16/35]
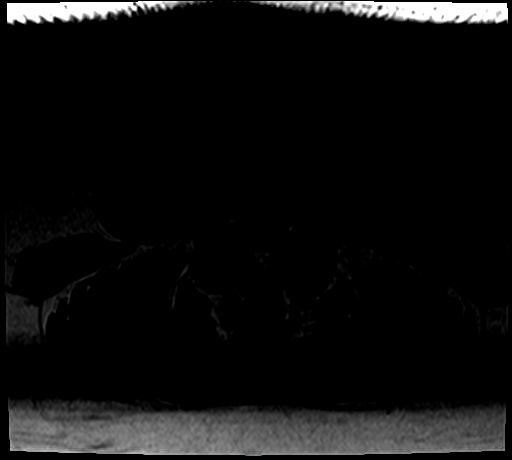
[im 19/35]
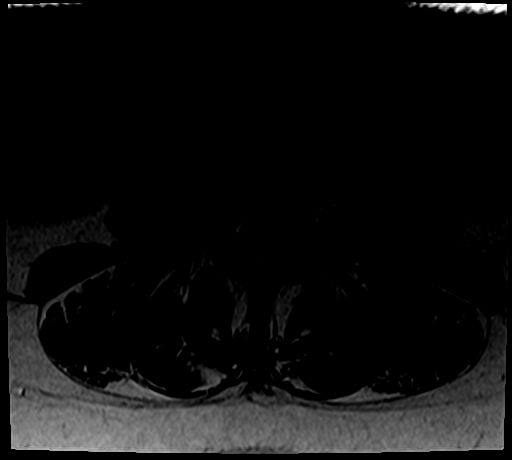
[im 30/35]
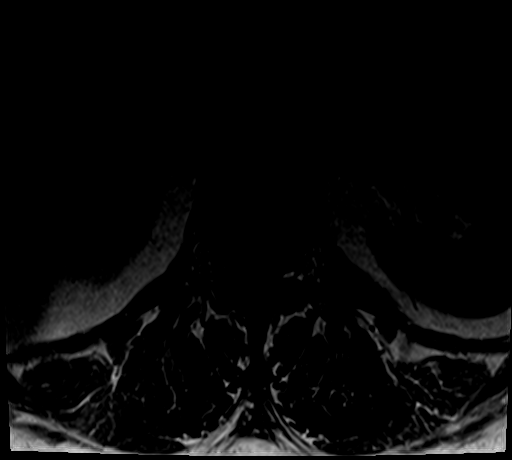

[28 of 48 positions shown; findings below may reference images not displayed]

FINDINGS: The sagittal MR images demonstrate normal alignment of
the lumbar vertebral bodies.  They demonstrate normal marrow
signal.  The last full intervertebral disc space is labeled L5-S1
and the conus medullaris terminates at L1.  There are moderate
facet degenerative changes but no definite pars defects.

No significant paraspinal or retroperitoneal process is identified.

L1-2:  No significant findings.

L2-3:  No significant findings.

L3-4:  Moderate facet degenerative changes but no focal disc
protrusion, spinal or foraminal stenosis.

L4-5:  Minimal disc bulge.  No focal disc protrusion, spinal or
foraminal stenosis.  There is severe facet disease bilaterally,
right greater than left.

L5-S1:  Minimal disc bulge.  Mild right foraminal encroachment.
Advanced facet disease.
IMPRESSION: 1.  Advanced lumbar facet disease without definite pars defects.
2.  No significant disc protrusions, spinal or foraminal stenosis.

## 2014-03-16 ENCOUNTER — Other Ambulatory Visit (HOSPITAL_BASED_OUTPATIENT_CLINIC_OR_DEPARTMENT_OTHER): Payer: Medicare Other

## 2014-03-16 ENCOUNTER — Ambulatory Visit (HOSPITAL_BASED_OUTPATIENT_CLINIC_OR_DEPARTMENT_OTHER): Payer: Medicare Other | Admitting: Pharmacist

## 2014-03-16 DIAGNOSIS — I82403 Acute embolism and thrombosis of unspecified deep veins of lower extremity, bilateral: Secondary | ICD-10-CM

## 2014-03-16 DIAGNOSIS — I824Y3 Acute embolism and thrombosis of unspecified deep veins of proximal lower extremity, bilateral: Secondary | ICD-10-CM

## 2014-03-16 LAB — PROTIME-INR
INR: 4.1 — ABNORMAL HIGH (ref 2.00–3.50)
Protime: 49.2 Seconds — ABNORMAL HIGH (ref 10.6–13.4)

## 2014-03-16 LAB — POCT INR: INR: 4.1

## 2014-03-16 MED ORDER — WARFARIN SODIUM 5 MG PO TABS: ORAL_TABLET | ORAL | Status: DC

## 2014-03-16 MED ORDER — WARFARIN SODIUM 10 MG PO TABS: ORAL_TABLET | ORAL | Status: DC

## 2014-03-16 NOTE — Addendum Note (Signed)
Addended by: Arbutus Ped on: 03/16/2014 09:55 AM   Modules accepted: Orders

## 2014-03-16 NOTE — Progress Notes (Signed)
INR = 4.1 INR above [place] range today. No bleeding or bruising noted. No dietary or medication changes. She states that she always has a slight headache when her INR is high, she has a mild headache currently. She is being treated for RLQ pain and will have an MRI soon to assess for joint pain vs hernia. She will not take Coumadin today, then take 10mg  (2 tablets) daily except 12.5 mg (2.5 tablets) on Tues/Thurs/Sat.  Since INR is elevated today, will see her sooner than usual. She will call if she has bleeding or bruising prior to her next appt. Recheck INR on 03/30/14; Lab at 10:15am, Coumadin clinic at 10:30am.  Theone Murdoch, PharmD  Samples given:  Coumadin 10 mg #20  Lot:  [id number]   Exp: 02/17/15                            Coumadin 5 mg #20    Lot:  [id number]   Exp:  04/20/15

## 2014-03-17 ENCOUNTER — Other Ambulatory Visit: Payer: Self-pay | Admitting: Orthopedic Surgery

## 2014-03-17 DIAGNOSIS — M25551 Pain in right hip: Secondary | ICD-10-CM

## 2014-03-24 ENCOUNTER — Ambulatory Visit
Admission: RE | Admit: 2014-03-24 | Discharge: 2014-03-24 | Disposition: A | Discharge status: Home / Self Care | Payer: Medicare Other | Source: Ambulatory Visit | Attending: Orthopedic Surgery | Admitting: Orthopedic Surgery

## 2014-03-24 DIAGNOSIS — M25551 Pain in right hip: Secondary | ICD-10-CM

## 2014-03-26 ENCOUNTER — Other Ambulatory Visit: Payer: Medicare Other

## 2014-03-28 ENCOUNTER — Emergency Department (HOSPITAL_COMMUNITY): Payer: Medicare Other

## 2014-03-28 ENCOUNTER — Emergency Department (HOSPITAL_COMMUNITY)
Admission: EM | Admit: 2014-03-28 | Discharge: 2014-03-28 | Disposition: A | Discharge status: Home / Self Care | Payer: Medicare Other | Attending: Emergency Medicine | Admitting: Emergency Medicine

## 2014-03-28 DIAGNOSIS — F329 Major depressive disorder, single episode, unspecified: Secondary | ICD-10-CM | POA: Insufficient documentation

## 2014-03-28 DIAGNOSIS — Y9389 Activity, other specified: Secondary | ICD-10-CM | POA: Insufficient documentation

## 2014-03-28 DIAGNOSIS — G8929 Other chronic pain: Secondary | ICD-10-CM | POA: Diagnosis not present

## 2014-03-28 DIAGNOSIS — S8991XA Unspecified injury of right lower leg, initial encounter: Secondary | ICD-10-CM | POA: Insufficient documentation

## 2014-03-28 DIAGNOSIS — Z79899 Other long term (current) drug therapy: Secondary | ICD-10-CM | POA: Diagnosis not present

## 2014-03-28 DIAGNOSIS — Y9289 Other specified places as the place of occurrence of the external cause: Secondary | ICD-10-CM | POA: Insufficient documentation

## 2014-03-28 DIAGNOSIS — J449 Chronic obstructive pulmonary disease, unspecified: Secondary | ICD-10-CM | POA: Insufficient documentation

## 2014-03-28 DIAGNOSIS — Y998 Other external cause status: Secondary | ICD-10-CM | POA: Diagnosis not present

## 2014-03-28 DIAGNOSIS — M25561 Pain in right knee: Secondary | ICD-10-CM

## 2014-03-28 DIAGNOSIS — I10 Essential (primary) hypertension: Secondary | ICD-10-CM | POA: Insufficient documentation

## 2014-03-28 DIAGNOSIS — G4730 Sleep apnea, unspecified: Secondary | ICD-10-CM | POA: Diagnosis not present

## 2014-03-28 DIAGNOSIS — Z86718 Personal history of other venous thrombosis and embolism: Secondary | ICD-10-CM | POA: Diagnosis not present

## 2014-03-28 DIAGNOSIS — Z87891 Personal history of nicotine dependence: Secondary | ICD-10-CM | POA: Insufficient documentation

## 2014-03-28 DIAGNOSIS — X58XXXA Exposure to other specified factors, initial encounter: Secondary | ICD-10-CM | POA: Insufficient documentation

## 2014-03-28 DIAGNOSIS — R52 Pain, unspecified: Secondary | ICD-10-CM

## 2014-03-28 DIAGNOSIS — Z7951 Long term (current) use of inhaled steroids: Secondary | ICD-10-CM | POA: Diagnosis not present

## 2014-03-28 DIAGNOSIS — Z862 Personal history of diseases of the blood and blood-forming organs and certain disorders involving the immune mechanism: Secondary | ICD-10-CM | POA: Insufficient documentation

## 2014-03-28 DIAGNOSIS — Z9981 Dependence on supplemental oxygen: Secondary | ICD-10-CM | POA: Diagnosis not present

## 2014-03-28 DIAGNOSIS — R011 Cardiac murmur, unspecified: Secondary | ICD-10-CM | POA: Diagnosis not present

## 2014-03-28 DIAGNOSIS — Z7901 Long term (current) use of anticoagulants: Secondary | ICD-10-CM | POA: Insufficient documentation

## 2014-03-28 DIAGNOSIS — M1990 Unspecified osteoarthritis, unspecified site: Secondary | ICD-10-CM | POA: Diagnosis not present

## 2014-03-28 DIAGNOSIS — K219 Gastro-esophageal reflux disease without esophagitis: Secondary | ICD-10-CM | POA: Diagnosis not present

## 2014-03-28 DIAGNOSIS — E119 Type 2 diabetes mellitus without complications: Secondary | ICD-10-CM | POA: Diagnosis not present

## 2014-03-28 MED ORDER — FENTANYL CITRATE 0.05 MG/ML IJ SOLN: 50.0000 ug | Freq: Once | INTRAMUSCULAR | Status: DC

## 2014-03-28 MED ORDER — HYDROMORPHONE HCL 1 MG/ML IJ SOLN
1.0000 mg | Freq: Once | INTRAMUSCULAR | Status: AC
  Administered 2014-03-28: 1 mg via INTRAMUSCULAR
  Filled 2014-03-28: qty 1

## 2014-03-28 MED ORDER — HYDROMORPHONE HCL 1 MG/ML IJ SOLN: 1.0000 mg | Freq: Once | INTRAMUSCULAR | Status: DC

## 2014-03-28 MED ORDER — FENTANYL CITRATE 0.05 MG/ML IJ SOLN
50.0000 ug | Freq: Once | INTRAMUSCULAR | Status: AC
  Administered 2014-03-28: 50 ug via NASAL
  Filled 2014-03-28: qty 2

## 2014-03-28 MED ORDER — OXYCODONE-ACETAMINOPHEN 5-325 MG PO TABS: 1.0000 | ORAL_TABLET | Freq: Once | ORAL | Status: DC

## 2014-03-28 NOTE — ED Notes (Signed)
Pt returned from XRAY 

## 2014-03-28 NOTE — ED Notes (Signed)
Pt arrives to the ER via EMS for complaints of Rt leg pain; pt states that she normally ambulates with a cane and walker and was ambulating fine earlier today; per report pt states that she began to have rt above the knee leg pain that began this afternoon; pt denies injury; pt states that the pain is so severe she is unable to move leg or bear weight; pt reports that bilateral leg swelling is normal for pt; pt states that she took Hydrocodone earlier without relief

## 2014-03-28 NOTE — ED Notes (Addendum)
Pt reports that she had blood clots in both legs. Recent documentation of blood clot in Dec. Takes coumadin,

## 2014-03-28 NOTE — ED Notes (Signed)
PT in XRAY

## 2014-03-28 NOTE — ED Provider Notes (Signed)
CSN: [id number]     Arrival date & time 03/28/14  0024 History   First MD Initiated Contact with Patient 03/28/14 0043     Chief Complaint  Patient presents with  . Leg Pain     HPI Patient reports feeling a popping sensation in her right knee approximately 2 days ago.  She's had a total right knee orthopedic procedure performed the past.  She reports she's been ambulatory on her right leg but today developed worsening pain in that right knee.  No fevers or chills.  No significant swelling of the right knee.  Chronic bilateral lower extremity swelling which she states is unchanged.  No other injury or trauma.  She feels like she can still extend at the right knee.  Pain is moderate in severity.  She has a history of chronic pain for which she takes oxycodone.  She tried this at home without improvement in her symptoms.   Past Medical History  Diagnosis Date  . DVT (deep venous thrombosis)     Recurrent  . Adrenal mass 03/226    Benign  . Lupus Anticoagulant Positive   . Clotting disorder     +beta-2-glycoprotein IgA antibody  . History of cocaine abuse     Remote history   . Heart murmur   . Hypertension     takes meds daily  . Depression   . Shortness of breath     exertion or lying flat  . 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 Sleep apnea     2l of oxygen at night  . On home oxygen therapy     at night  . Diabetes mellitus     120s usually fasting  . GERD (gastroesophageal reflux disease)   . Dizziness, nonspecific   . Arthritis     knees/multiple orthopedic conditons; lower back  . History of blood transfusion   . Fibromyalgia    Past Surgical History  Procedure Laterality Date  . Carpal tunnel release      Bilateral  . Abdominal hysterectomy  1989    Fibroids  . Back surgery      cervical spine---disk disease  . Cesarean section    . Replacement total knee  11/17/2008    bilateral  . Right elbow surgery    .  Tubal ligation    . Gallstones removed    . Anterior lumbar fusion  01/03/2012    Procedure: ANTERIOR LUMBAR FUSION 1 LEVEL;  Surgeon: Winfield Cunas, MD;  Location: [facility];  Service: Neurosurgery;  Laterality: N/A;  Lumbar Four-Five Anterior Lumbar Interbody Fusion with Instrumentation   Family History  Problem Relation Age of Onset  . Hypertension Father   . Heart failure Father   . Hypertension Mother   . Diabetes Mother   . Cancer Mother     Pancreatic   History  Substance Use Topics  . Smoking status: Former Smoker    Start date: 03/20/2001    Quit date: 03/21/2003  . Smokeless tobacco: Never Used     Comment: smoked for 2 years  . Alcohol Use: No   OB History    No data available     Review of Systems  All other systems reviewed and are negative.     Allergies  Corticosteroids  Home Medications   Prior to Admission medications   Medication Sig Start Date End Date Taking? Authorizing Provider  albuterol (PROVENTIL HFA;VENTOLIN HFA) 108 (  90 BASE) MCG/ACT inhaler Inhale 2 puffs into the lungs every 4 (four) hours as needed. For shortness of breath    Historical Provider, MD  amLODipine (NORVASC) 5 MG tablet Take 5 mg by mouth daily.     Historical Provider, MD  budesonide-formoterol (SYMBICORT) 80-4.5 MCG/ACT inhaler Inhale 2 puffs into the lungs 2 (two) times daily. 08/21/13   Erline Hau, MD  cloNIDine (CATAPRES) 0.1 MG tablet Take 0.1 mg by mouth 2 (two) times daily.     Historical Provider, MD  diltiazem (CARDIZEM CD) 180 MG 24 hr capsule Take 180 mg by mouth daily.     Historical Provider, MD  DULoxetine (CYMBALTA) 60 MG capsule Take 60 mg by mouth daily.     Historical Provider, MD  ferrous sulfate 325 (65 FE) MG tablet Take 1 tablet (325 mg total) by mouth 2 (two) times daily with a meal. 08/21/13   Erline Hau, MD  gabapentin (NEURONTIN) 300 MG capsule Take 300 mg by mouth Three times a day.  11/24/11   Historical Provider, MD   hydrochlorothiazide (MICROZIDE) 12.5 MG capsule Take 12.5 mg by mouth daily.    Historical Provider, MD  HYDROcodone-acetaminophen (NORCO) 7.5-325 MG per tablet Take 1 tablet by mouth every 6 (six) hours as needed for moderate pain. 08/21/13   Erline Hau, MD  lisinopril (PRINIVIL,ZESTRIL) 5 MG tablet Take 1 tablet (5 mg total) by mouth daily. 08/21/13   Erline Hau, MD  loratadine (CLARITIN) 10 MG tablet Take 10 mg by mouth daily as needed. For allergies.    Historical Provider, MD  metFORMIN (GLUCOPHAGE) 500 MG tablet Take 500 mg by mouth at bedtime.     Historical Provider, MD  omeprazole (PRILOSEC) 20 MG capsule Take 20 mg by mouth 2 (two) times daily.    Historical Provider, MD  orphenadrine (NORFLEX) 100 MG tablet Take 1 tablet (100 mg total) by mouth 2 (two) times daily. 02/17/13   Elisha Headland, NP  oxyCODONE-acetaminophen (PERCOCET/ROXICET) 5-325 MG per tablet Take 1 tablet by mouth every 4 (four) hours as needed for moderate pain or severe pain. 02/09/14   Shaune Pollack, MD  polyethylene glycol (MIRALAX / Floria Raveling) packet Take 17 g by mouth daily.     Historical Provider, MD  pravastatin (PRAVACHOL) 40 MG tablet Take 1 tablet by mouth daily. 02/18/12   Historical Provider, MD  QUEtiapine (SEROQUEL) 50 MG tablet Take 50 mg by mouth 2 (two) times daily.     Historical Provider, MD  Rubber Goods (ENEMA BOTTLE) MISC Place 1 Bottle rectally as needed.    Historical Provider, MD  traMADol (ULTRAM) 50 MG tablet Take 100 mg by mouth every 6 (six) hours as needed for moderate pain.    Historical Provider, MD  warfarin (COUMADIN) 10 MG tablet Take 1 tablet (10mg ) daily. 03/16/14   Ladell Pier, MD  warfarin (COUMADIN) 4 MG tablet TAKE 1 TABLET BY MOUTH ONCE DAILY WITH 5 MG TABLETS AS DIRECTED 01/22/14   Ladell Pier, MD  warfarin (COUMADIN) 5 MG tablet Take 1/2 tablet (2.5 mg) on Tuesdays, Thursdays, and Saturdays with Coumadin 10 mg (total dose 12.5 mg). 03/16/14   Ladell Pier, MD   BP 176/103 mmHg  Pulse 95  Temp(Src) 98.2 F (36.8 C) (Oral)  Resp 18  SpO2 100% Physical Exam  Constitutional: She is oriented to person, place, and time. She appears well-developed and well-nourished.  HENT:  Head: Normocephalic.  Eyes: EOM  are normal.  Neck: Normal range of motion.  Pulmonary/Chest: Effort normal.  Abdominal: She exhibits no distension.  Musculoskeletal: Normal range of motion.  Normal PT and DP pulse in right foot.  Mild pain with range of motion of right knee.  Mild tenderness to the medial lateral joint line of the right knee.  No significant effusion appreciated.  No significant warmth or erythema of the right knee.  Patellar tendon and quad tendon appear intact although examination is limited secondary to the patient's pain.  Neurological: She is alert and oriented to person, place, and time.  Psychiatric: She has a normal mood and affect.  Nursing note and vitals reviewed.   ED Course  Procedures (including critical care time) Labs Review Labs Reviewed - No data to display  Imaging Review Dg Knee Complete 4 Views Right  03/28/2014   CLINICAL DATA:  Right leg pain.  Difficulty ambulating.  No injury.  EXAM: RIGHT KNEE - COMPLETE 4+ VIEW  COMPARISON:  11/29/2010  FINDINGS: Right total knee arthroplasty. Components appear well seated. No change in position of hardware since prior study. No significant effusion. No acute fracture or dislocation. Postoperative contracts in the proximal tibial shaft.  IMPRESSION: Right total knee arthroplasty. Components appear well seated. No acute bony abnormalities identified.   Electronically Signed   By: Lucienne Capers M.D.   On: 03/28/2014 01:12   Dg Femur, Min 2 Views Right  03/28/2014   CLINICAL DATA:  Right leg pain. Pain began and above the knee this afternoon.  EXAM: DG FEMUR 2+V*R*  COMPARISON:  None.  FINDINGS: There is a total right knee arthroplasty. The knee joint and proximal hip joint appear proper  normal. No evidence of fracture or dislocation.  IMPRESSION: No acute findings of the right femur.   Electronically Signed   By: Suzy Bouchard M.D.   On: 03/28/2014 01:11  I personally reviewed the imaging tests through PACS system I reviewed available ER/hospitalization records through the EMR    EKG Interpretation None      MDM   Final diagnoses:  Right knee pain    Patient's pain is improved.  Home with a knee immobilizer crutches and orthopedic follow-up.  She understands return to the ER for new or worsening symptoms    Hoy Morn, MD 03/28/14 0425

## 2014-03-28 NOTE — Discharge Instructions (Signed)

## 2014-03-30 ENCOUNTER — Other Ambulatory Visit: Payer: Medicare Other

## 2014-03-30 ENCOUNTER — Ambulatory Visit: Payer: Medicare Other

## 2014-04-06 ENCOUNTER — Ambulatory Visit: Payer: Medicare Other

## 2014-04-06 ENCOUNTER — Other Ambulatory Visit: Payer: Medicare Other

## 2014-04-07 ENCOUNTER — Ambulatory Visit: Payer: Medicare Other

## 2014-04-07 ENCOUNTER — Telehealth: Payer: Self-pay

## 2014-04-07 ENCOUNTER — Other Ambulatory Visit: Payer: Medicare Other

## 2014-04-07 NOTE — Telephone Encounter (Signed)
[name] daughter called the [facility] today regarding her mother's appointment today. She stated her mom was currently at the doctor's office and in a lot of pain so she would be unable to make her appointment this afternoon, and was calling to see if it could be rescheduled to next week. She asked her mother during our conversation if she was having any issues with her Coumadin, and [name] responded that she does not have any at this time. No bleeding and/or unusual bruising, no extra and/or missed doses, etc.  Per [name] request, we will cancel her [facility] appointment today and reschedule her for 1/26 at 10:30 am for lab and 10:45 am for Coumadin Clinic. [name] daughter voiced her understanding of this plan and will call the clinic if there are any issues in the meantime.

## 2014-04-09 ENCOUNTER — Other Ambulatory Visit: Payer: Medicare Other

## 2014-04-13 ENCOUNTER — Ambulatory Visit: Payer: Medicare Other | Admitting: Oncology

## 2014-04-13 ENCOUNTER — Other Ambulatory Visit: Payer: Self-pay | Admitting: *Deleted

## 2014-04-13 NOTE — Progress Notes (Signed)
Pt missed office visit today. Order sent to schedulers for appt in one month.

## 2014-04-14 ENCOUNTER — Ambulatory Visit: Payer: Medicare Other

## 2014-04-14 ENCOUNTER — Other Ambulatory Visit: Payer: Medicare Other

## 2014-04-14 ENCOUNTER — Telehealth: Payer: Self-pay | Admitting: Oncology

## 2014-04-14 ENCOUNTER — Telehealth: Payer: Self-pay | Admitting: Pharmacist

## 2014-04-14 NOTE — Telephone Encounter (Signed)
Pt called to cancel due to flat tire and could not get here. I sent pt to [facility] and will r/s pt once I hear from them. KJ

## 2014-04-14 NOTE — Telephone Encounter (Signed)
Lft msg for pt confirming labs/ov per 01/25 POF, mailed sch/ltr to pt.... KJ

## 2014-04-14 NOTE — Telephone Encounter (Signed)
Pt had a flat tire today and did not come for lab/Coumadin clinic appts. She will come tomorrow at 10 am for lab; 10:15 am for Coumadin clinic. Kennith Center, Pharm.D., CPP 04/14/2014@11 :55 AM

## 2014-04-15 ENCOUNTER — Other Ambulatory Visit (HOSPITAL_BASED_OUTPATIENT_CLINIC_OR_DEPARTMENT_OTHER): Payer: Medicare Other

## 2014-04-15 ENCOUNTER — Ambulatory Visit (HOSPITAL_COMMUNITY)
Admission: AD | Admit: 2014-04-15 | Discharge: 2014-04-15 | Disposition: A | Discharge status: Home / Self Care | Payer: Medicare Other | Source: Ambulatory Visit | Attending: Oncology | Admitting: Oncology

## 2014-04-15 ENCOUNTER — Ambulatory Visit: Payer: Medicare Other

## 2014-04-15 DIAGNOSIS — Z86718 Personal history of other venous thrombosis and embolism: Secondary | ICD-10-CM | POA: Insufficient documentation

## 2014-04-15 DIAGNOSIS — Z7901 Long term (current) use of anticoagulants: Secondary | ICD-10-CM | POA: Insufficient documentation

## 2014-04-15 DIAGNOSIS — I82403 Acute embolism and thrombosis of unspecified deep veins of lower extremity, bilateral: Secondary | ICD-10-CM

## 2014-04-15 DIAGNOSIS — I824Y3 Acute embolism and thrombosis of unspecified deep veins of proximal lower extremity, bilateral: Secondary | ICD-10-CM

## 2014-04-15 LAB — PROTIME-INR
INR: 3.98 — ABNORMAL HIGH (ref 0.00–1.49)
Prothrombin Time: 39.1 seconds — ABNORMAL HIGH (ref 11.6–15.2)

## 2014-04-15 NOTE — Progress Notes (Signed)
*  Telephone encounter - no charge*  [name] send-out INR was 3.98 today which is above her Heinrich range of 2-3. She has been taking 10 mg daily except 12.5 mg on Tuesdays, Thursdays, and Saturdays.  She denies any extra and/or missed doses of her Coumadin. No bleeding and/or unusual bruising reported.  No medication changes, other than taking more pain medications lately due to her arthritis symptoms. No dietary changes to report either.  Plan: -Hold Coumadin today, then take 10 mg daily. -Return to [facility] Clinic 2/9 at 12:00 pm  for lab and [facility] Clinic at 12:15

## 2014-04-24 ENCOUNTER — Emergency Department (HOSPITAL_COMMUNITY): Payer: Medicare Other

## 2014-04-24 ENCOUNTER — Encounter (HOSPITAL_COMMUNITY): Payer: Self-pay | Admitting: *Deleted

## 2014-04-24 ENCOUNTER — Other Ambulatory Visit: Payer: Self-pay

## 2014-04-24 ENCOUNTER — Inpatient Hospital Stay (HOSPITAL_COMMUNITY)
Admission: EM | Admit: 2014-04-24 | Discharge: 2014-04-30 | DRG: 693 | Disposition: A | Discharge status: Home / Self Care | Payer: Medicare Other | Attending: Internal Medicine | Admitting: Internal Medicine

## 2014-04-24 DIAGNOSIS — Z9981 Dependence on supplemental oxygen: Secondary | ICD-10-CM | POA: Diagnosis not present

## 2014-04-24 DIAGNOSIS — N1330 Unspecified hydronephrosis: Secondary | ICD-10-CM | POA: Diagnosis present

## 2014-04-24 DIAGNOSIS — E876 Hypokalemia: Secondary | ICD-10-CM | POA: Diagnosis present

## 2014-04-24 DIAGNOSIS — I5032 Chronic diastolic (congestive) heart failure: Secondary | ICD-10-CM | POA: Diagnosis present

## 2014-04-24 DIAGNOSIS — Z7901 Long term (current) use of anticoagulants: Secondary | ICD-10-CM

## 2014-04-24 DIAGNOSIS — R1031 Right lower quadrant pain: Secondary | ICD-10-CM | POA: Diagnosis present

## 2014-04-24 DIAGNOSIS — Z888 Allergy status to other drugs, medicaments and biological substances status: Secondary | ICD-10-CM

## 2014-04-24 DIAGNOSIS — F329 Major depressive disorder, single episode, unspecified: Secondary | ICD-10-CM | POA: Diagnosis present

## 2014-04-24 DIAGNOSIS — E1165 Type 2 diabetes mellitus with hyperglycemia: Secondary | ICD-10-CM | POA: Diagnosis present

## 2014-04-24 DIAGNOSIS — D649 Anemia, unspecified: Secondary | ICD-10-CM | POA: Diagnosis not present

## 2014-04-24 DIAGNOSIS — M1990 Unspecified osteoarthritis, unspecified site: Secondary | ICD-10-CM | POA: Diagnosis present

## 2014-04-24 DIAGNOSIS — I82403 Acute embolism and thrombosis of unspecified deep veins of lower extremity, bilateral: Secondary | ICD-10-CM | POA: Diagnosis present

## 2014-04-24 DIAGNOSIS — Z6841 Body Mass Index (BMI) 40.0 and over, adult: Secondary | ICD-10-CM

## 2014-04-24 DIAGNOSIS — Z7951 Long term (current) use of inhaled steroids: Secondary | ICD-10-CM

## 2014-04-24 DIAGNOSIS — Z833 Family history of diabetes mellitus: Secondary | ICD-10-CM

## 2014-04-24 DIAGNOSIS — R790 Abnormal level of blood mineral: Secondary | ICD-10-CM

## 2014-04-24 DIAGNOSIS — Z79891 Long term (current) use of opiate analgesic: Secondary | ICD-10-CM

## 2014-04-24 DIAGNOSIS — E278 Other specified disorders of adrenal gland: Secondary | ICD-10-CM | POA: Diagnosis present

## 2014-04-24 DIAGNOSIS — E1142 Type 2 diabetes mellitus with diabetic polyneuropathy: Secondary | ICD-10-CM | POA: Diagnosis present

## 2014-04-24 DIAGNOSIS — E871 Hypo-osmolality and hyponatremia: Secondary | ICD-10-CM | POA: Diagnosis present

## 2014-04-24 DIAGNOSIS — Z86718 Personal history of other venous thrombosis and embolism: Secondary | ICD-10-CM | POA: Diagnosis not present

## 2014-04-24 DIAGNOSIS — J438 Other emphysema: Secondary | ICD-10-CM

## 2014-04-24 DIAGNOSIS — J449 Chronic obstructive pulmonary disease, unspecified: Secondary | ICD-10-CM | POA: Diagnosis present

## 2014-04-24 DIAGNOSIS — J45909 Unspecified asthma, uncomplicated: Secondary | ICD-10-CM | POA: Diagnosis present

## 2014-04-24 DIAGNOSIS — I10 Essential (primary) hypertension: Secondary | ICD-10-CM | POA: Diagnosis present

## 2014-04-24 DIAGNOSIS — G4733 Obstructive sleep apnea (adult) (pediatric): Secondary | ICD-10-CM | POA: Diagnosis present

## 2014-04-24 DIAGNOSIS — R791 Abnormal coagulation profile: Secondary | ICD-10-CM | POA: Diagnosis present

## 2014-04-24 DIAGNOSIS — M797 Fibromyalgia: Secondary | ICD-10-CM | POA: Diagnosis present

## 2014-04-24 DIAGNOSIS — E119 Type 2 diabetes mellitus without complications: Secondary | ICD-10-CM | POA: Diagnosis present

## 2014-04-24 DIAGNOSIS — E279 Disorder of adrenal gland, unspecified: Secondary | ICD-10-CM

## 2014-04-24 DIAGNOSIS — E785 Hyperlipidemia, unspecified: Secondary | ICD-10-CM | POA: Diagnosis present

## 2014-04-24 DIAGNOSIS — F209 Schizophrenia, unspecified: Secondary | ICD-10-CM | POA: Diagnosis present

## 2014-04-24 DIAGNOSIS — N132 Hydronephrosis with renal and ureteral calculous obstruction: Secondary | ICD-10-CM | POA: Diagnosis present

## 2014-04-24 DIAGNOSIS — G894 Chronic pain syndrome: Secondary | ICD-10-CM | POA: Diagnosis present

## 2014-04-24 DIAGNOSIS — D638 Anemia in other chronic diseases classified elsewhere: Secondary | ICD-10-CM | POA: Diagnosis present

## 2014-04-24 DIAGNOSIS — Z87891 Personal history of nicotine dependence: Secondary | ICD-10-CM | POA: Diagnosis not present

## 2014-04-24 DIAGNOSIS — N1339 Other hydronephrosis: Principal | ICD-10-CM | POA: Diagnosis present

## 2014-04-24 DIAGNOSIS — Z79899 Other long term (current) drug therapy: Secondary | ICD-10-CM | POA: Diagnosis not present

## 2014-04-24 DIAGNOSIS — N2889 Other specified disorders of kidney and ureter: Secondary | ICD-10-CM | POA: Diagnosis present

## 2014-04-24 DIAGNOSIS — F1490 Cocaine use, unspecified, uncomplicated: Secondary | ICD-10-CM | POA: Diagnosis present

## 2014-04-24 DIAGNOSIS — IMO0002 Reserved for concepts with insufficient information to code with codable children: Secondary | ICD-10-CM | POA: Diagnosis present

## 2014-04-24 DIAGNOSIS — M7989 Other specified soft tissue disorders: Secondary | ICD-10-CM | POA: Diagnosis present

## 2014-04-24 DIAGNOSIS — J441 Chronic obstructive pulmonary disease with (acute) exacerbation: Secondary | ICD-10-CM | POA: Diagnosis present

## 2014-04-24 DIAGNOSIS — N179 Acute kidney failure, unspecified: Secondary | ICD-10-CM | POA: Diagnosis present

## 2014-04-24 DIAGNOSIS — D3501 Benign neoplasm of right adrenal gland: Secondary | ICD-10-CM | POA: Diagnosis present

## 2014-04-24 DIAGNOSIS — R310 Gross hematuria: Secondary | ICD-10-CM | POA: Diagnosis present

## 2014-04-24 DIAGNOSIS — Z8249 Family history of ischemic heart disease and other diseases of the circulatory system: Secondary | ICD-10-CM | POA: Diagnosis not present

## 2014-04-24 DIAGNOSIS — I5033 Acute on chronic diastolic (congestive) heart failure: Secondary | ICD-10-CM | POA: Diagnosis present

## 2014-04-24 DIAGNOSIS — Z9989 Dependence on other enabling machines and devices: Secondary | ICD-10-CM

## 2014-04-24 DIAGNOSIS — Z808 Family history of malignant neoplasm of other organs or systems: Secondary | ICD-10-CM

## 2014-04-24 DIAGNOSIS — K219 Gastro-esophageal reflux disease without esophagitis: Secondary | ICD-10-CM | POA: Diagnosis present

## 2014-04-24 DIAGNOSIS — Z90710 Acquired absence of both cervix and uterus: Secondary | ICD-10-CM

## 2014-04-24 DIAGNOSIS — R760 Raised antibody titer: Secondary | ICD-10-CM | POA: Diagnosis present

## 2014-04-24 DIAGNOSIS — F32A Depression, unspecified: Secondary | ICD-10-CM | POA: Diagnosis present

## 2014-04-24 HISTORY — DX: Unspecified asthma, uncomplicated: J45.909

## 2014-04-24 LAB — URINALYSIS, ROUTINE W REFLEX MICROSCOPIC
Bilirubin Urine: NEGATIVE
Glucose, UA: NEGATIVE mg/dL
Ketones, ur: NEGATIVE mg/dL
Leukocytes, UA: NEGATIVE
Nitrite: NEGATIVE
Protein, ur: NEGATIVE mg/dL
Specific Gravity, Urine: 1.034 — ABNORMAL HIGH (ref 1.005–1.030)
Urobilinogen, UA: 0.2 mg/dL (ref 0.0–1.0)
pH: 6 (ref 5.0–8.0)

## 2014-04-24 LAB — CBC WITH DIFFERENTIAL/PLATELET
Basophils Absolute: 0 10*3/uL (ref 0.0–0.1)
Basophils Relative: 0 % (ref 0–1)
Eosinophils Absolute: 0 10*3/uL (ref 0.0–0.7)
Eosinophils Relative: 0 % (ref 0–5)
HCT: 35.7 % — ABNORMAL LOW (ref 36.0–46.0)
Hemoglobin: 11.9 g/dL — ABNORMAL LOW (ref 12.0–15.0)
Lymphocytes Relative: 13 % (ref 12–46)
Lymphs Abs: 1.4 10*3/uL (ref 0.7–4.0)
MCH: 27.2 pg (ref 26.0–34.0)
MCHC: 33.3 g/dL (ref 30.0–36.0)
MCV: 81.5 fL (ref 78.0–100.0)
Monocytes Absolute: 1 10*3/uL (ref 0.1–1.0)
Monocytes Relative: 9 % (ref 3–12)
Neutro Abs: 7.9 10*3/uL — ABNORMAL HIGH (ref 1.7–7.7)
Neutrophils Relative %: 78 % — ABNORMAL HIGH (ref 43–77)
Platelets: 351 10*3/uL (ref 150–400)
RBC: 4.38 MIL/uL (ref 3.87–5.11)
RDW: 16.9 % — ABNORMAL HIGH (ref 11.5–15.5)
WBC: 10.3 10*3/uL (ref 4.0–10.5)

## 2014-04-24 LAB — PROTIME-INR
INR: 7.08 (ref 0.00–1.49)
Prothrombin Time: 61.4 seconds — ABNORMAL HIGH (ref 11.6–15.2)

## 2014-04-24 LAB — COMPREHENSIVE METABOLIC PANEL
ALT: 20 U/L (ref 0–35)
AST: 24 U/L (ref 0–37)
Albumin: 4.2 g/dL (ref 3.5–5.2)
Alkaline Phosphatase: 92 U/L (ref 39–117)
Anion gap: 11 (ref 5–15)
BUN: 14 mg/dL (ref 6–23)
CO2: 26 mmol/L (ref 19–32)
Calcium: 8.2 mg/dL — ABNORMAL LOW (ref 8.4–10.5)
Chloride: 96 mmol/L (ref 96–112)
Creatinine, Ser: 1.21 mg/dL — ABNORMAL HIGH (ref 0.50–1.10)
GFR calc Af Amer: 56 mL/min — ABNORMAL LOW (ref >90)
GFR calc non Af Amer: 48 mL/min — ABNORMAL LOW (ref >90)
Glucose, Bld: 158 mg/dL — ABNORMAL HIGH (ref 70–99)
Potassium: 2.9 mmol/L — ABNORMAL LOW (ref 3.5–5.1)
Sodium: 133 mmol/L — ABNORMAL LOW (ref 135–145)
Total Bilirubin: 0.7 mg/dL (ref 0.3–1.2)
Total Protein: 8.2 g/dL (ref 6.0–8.3)

## 2014-04-24 LAB — MAGNESIUM: Magnesium: 1.2 mg/dL — ABNORMAL LOW (ref 1.5–2.5)

## 2014-04-24 LAB — POTASSIUM: Potassium: 3.4 mmol/L — ABNORMAL LOW (ref 3.5–5.1)

## 2014-04-24 LAB — URINE MICROSCOPIC-ADD ON

## 2014-04-24 LAB — CBG MONITORING, ED: Glucose-Capillary: 146 mg/dL — ABNORMAL HIGH (ref 70–99)

## 2014-04-24 LAB — LIPASE, BLOOD: Lipase: 18 U/L (ref 11–59)

## 2014-04-24 MED ORDER — SENNOSIDES-DOCUSATE SODIUM 8.6-50 MG PO TABS: 1.0000 | ORAL_TABLET | Freq: Every evening | ORAL | Status: DC | PRN

## 2014-04-24 MED ORDER — SODIUM CHLORIDE 0.9 % IV BOLUS (SEPSIS)
1000.0000 mL | Freq: Once | INTRAVENOUS | Status: AC
  Administered 2014-04-24: 1000 mL via INTRAVENOUS

## 2014-04-24 MED ORDER — BUDESONIDE-FORMOTEROL FUMARATE 80-4.5 MCG/ACT IN AERO
2.0000 | INHALATION_SPRAY | Freq: Two times a day (BID) | RESPIRATORY_TRACT | Status: DC
  Administered 2014-04-24 – 2014-04-29 (×9): 2 via RESPIRATORY_TRACT
  Filled 2014-04-24: qty 6.9

## 2014-04-24 MED ORDER — DULOXETINE HCL 60 MG PO CPEP
60.0000 mg | ORAL_CAPSULE | Freq: Every day | ORAL | Status: DC
  Administered 2014-04-25 – 2014-04-30 (×5): 60 mg via ORAL
  Filled 2014-04-24 (×6): qty 1

## 2014-04-24 MED ORDER — PRAVASTATIN SODIUM 40 MG PO TABS
40.0000 mg | ORAL_TABLET | Freq: Every day | ORAL | Status: DC
Start: 2014-04-25 — End: 2014-04-30
  Administered 2014-04-25 – 2014-04-30 (×5): 40 mg via ORAL
  Filled 2014-04-24 (×6): qty 1

## 2014-04-24 MED ORDER — MAGNESIUM SULFATE 2 GM/50ML IV SOLN
2.0000 g | Freq: Once | INTRAVENOUS | Status: AC
  Administered 2014-04-25: 2 g via INTRAVENOUS
  Filled 2014-04-24: qty 50

## 2014-04-24 MED ORDER — HYDROMORPHONE HCL 1 MG/ML IJ SOLN
1.0000 mg | Freq: Once | INTRAMUSCULAR | Status: AC
Start: 2014-04-24 — End: 2014-04-24
  Administered 2014-04-24: 1 mg via INTRAVENOUS
  Filled 2014-04-24: qty 1

## 2014-04-24 MED ORDER — ASPIRIN EC 81 MG PO TBEC
81.0000 mg | DELAYED_RELEASE_TABLET | Freq: Every day | ORAL | Status: DC
  Administered 2014-04-25 – 2014-04-30 (×5): 81 mg via ORAL
  Filled 2014-04-24 (×6): qty 1

## 2014-04-24 MED ORDER — POTASSIUM CHLORIDE CRYS ER 20 MEQ PO TBCR
40.0000 meq | EXTENDED_RELEASE_TABLET | Freq: Once | ORAL | Status: AC
  Administered 2014-04-24: 40 meq via ORAL
  Filled 2014-04-24: qty 2

## 2014-04-24 MED ORDER — HYDROCODONE-ACETAMINOPHEN 7.5-325 MG PO TABS: 1.0000 | ORAL_TABLET | Freq: Four times a day (QID) | ORAL | Status: DC | PRN

## 2014-04-24 MED ORDER — HYDROMORPHONE HCL 1 MG/ML IJ SOLN
1.0000 mg | INTRAMUSCULAR | Status: DC | PRN
  Administered 2014-04-25 (×2): 1 mg via INTRAVENOUS
  Filled 2014-04-24 (×2): qty 1

## 2014-04-24 MED ORDER — VITAMIN K1 10 MG/ML IJ SOLN
5.0000 mg | Freq: Once | INTRAVENOUS | Status: AC
  Administered 2014-04-24: 5 mg via INTRAVENOUS
  Filled 2014-04-24: qty 0.5

## 2014-04-24 MED ORDER — QUETIAPINE FUMARATE 50 MG PO TABS
50.0000 mg | ORAL_TABLET | Freq: Two times a day (BID) | ORAL | Status: DC
  Administered 2014-04-24 – 2014-04-30 (×11): 50 mg via ORAL
  Filled 2014-04-24 (×13): qty 1

## 2014-04-24 MED ORDER — ONDANSETRON HCL 4 MG/2ML IJ SOLN: 4.0000 mg | Freq: Four times a day (QID) | INTRAMUSCULAR | Status: DC | PRN

## 2014-04-24 MED ORDER — ONDANSETRON HCL 4 MG PO TABS: 4.0000 mg | ORAL_TABLET | Freq: Four times a day (QID) | ORAL | Status: DC | PRN

## 2014-04-24 MED ORDER — MAGNESIUM OXIDE 400 (241.3 MG) MG PO TABS
400.0000 mg | ORAL_TABLET | Freq: Once | ORAL | Status: AC
  Administered 2014-04-25: 400 mg via ORAL
  Filled 2014-04-24: qty 1

## 2014-04-24 MED ORDER — IOHEXOL 300 MG/ML  SOLN
100.0000 mL | Freq: Once | INTRAMUSCULAR | Status: AC | PRN
  Administered 2014-04-24: 100 mL via INTRAVENOUS

## 2014-04-24 MED ORDER — GABAPENTIN 300 MG PO CAPS
300.0000 mg | ORAL_CAPSULE | Freq: Three times a day (TID) | ORAL | Status: DC
  Administered 2014-04-24 – 2014-04-30 (×16): 300 mg via ORAL
  Filled 2014-04-24 (×19): qty 1

## 2014-04-24 MED ORDER — LISINOPRIL 5 MG PO TABS
5.0000 mg | ORAL_TABLET | Freq: Every day | ORAL | Status: DC
  Administered 2014-04-24 – 2014-04-25 (×2): 5 mg via ORAL
  Filled 2014-04-24 (×2): qty 1

## 2014-04-24 MED ORDER — FERROUS SULFATE 325 (65 FE) MG PO TABS
325.0000 mg | ORAL_TABLET | Freq: Two times a day (BID) | ORAL | Status: DC
Start: 2014-04-25 — End: 2014-04-30
  Administered 2014-04-25 – 2014-04-30 (×10): 325 mg via ORAL
  Filled 2014-04-24 (×13): qty 1

## 2014-04-24 MED ORDER — IOHEXOL 300 MG/ML  SOLN
50.0000 mL | Freq: Once | INTRAMUSCULAR | Status: AC | PRN
  Administered 2014-04-24: 50 mL via ORAL

## 2014-04-24 MED ORDER — POTASSIUM CHLORIDE CRYS ER 20 MEQ PO TBCR
30.0000 meq | EXTENDED_RELEASE_TABLET | Freq: Once | ORAL | Status: AC
  Administered 2014-04-25: 30 meq via ORAL
  Filled 2014-04-24: qty 1

## 2014-04-24 MED ORDER — MORPHINE SULFATE 4 MG/ML IJ SOLN
4.0000 mg | Freq: Once | INTRAMUSCULAR | Status: AC
  Administered 2014-04-24: 4 mg via INTRAVENOUS
  Filled 2014-04-24: qty 1

## 2014-04-24 MED ORDER — ONDANSETRON HCL 4 MG/2ML IJ SOLN
4.0000 mg | Freq: Once | INTRAMUSCULAR | Status: AC
  Administered 2014-04-24: 4 mg via INTRAVENOUS
  Filled 2014-04-24: qty 2

## 2014-04-24 MED ORDER — CLONIDINE HCL 0.1 MG PO TABS
0.1000 mg | ORAL_TABLET | Freq: Two times a day (BID) | ORAL | Status: DC
  Administered 2014-04-24 – 2014-04-30 (×11): 0.1 mg via ORAL
  Filled 2014-04-24 (×13): qty 1

## 2014-04-24 MED ORDER — SODIUM CHLORIDE 0.9 % IV SOLN
INTRAVENOUS | Status: DC
  Administered 2014-04-24: 250 mL via INTRAVENOUS
  Administered 2014-04-25: 05:00:00 via INTRAVENOUS

## 2014-04-24 MED ORDER — METOPROLOL TARTRATE 12.5 MG HALF TABLET
12.5000 mg | ORAL_TABLET | Freq: Two times a day (BID) | ORAL | Status: DC
  Administered 2014-04-24 – 2014-04-28 (×7): 12.5 mg via ORAL
  Filled 2014-04-24 (×9): qty 1

## 2014-04-24 MED ORDER — HYDROMORPHONE HCL 1 MG/ML IJ SOLN
1.0000 mg | Freq: Once | INTRAMUSCULAR | Status: AC
  Administered 2014-04-24: 1 mg via INTRAVENOUS
  Filled 2014-04-24: qty 1

## 2014-04-24 NOTE — ED Notes (Signed)
Pt's critical lab of an INR of 7.08 relayed to RN and PA

## 2014-04-24 NOTE — ED Provider Notes (Signed)
CSN: [id number]     Arrival date & time 04/24/14  1629 History   First MD Initiated Contact with Patient 04/24/14 1637     Chief Complaint  Patient presents with  . Abdominal Pain     (Consider location/radiation/quality/duration/timing/severity/associated sxs/prior Treatment) HPI Comments: Patient is a 59 year old female with a past medical history including diabetes, hypertension, fibromyalgia, previous DVT on coumadin, and COPD who presents with abdominal pain that started yesterday. The pain is located in the RLQ and does not radiate. The pain is described as aching and severe. The pain started gradually and progressively worsened since the onset. No alleviating/aggravating factors. The patient has tried oxycodone for symptoms without relief. Associated symptoms include chills and decreased appetite. Patient denies fever, headache, NVD, chest pain, SOB, dysuria, constipation, abnormal vaginal bleeding/discharge. She reports normal bowel movements. She reports history of gallstone removal and denies any abdominal surgery.      Past Medical History  Diagnosis Date  . DVT (deep venous thrombosis)     Recurrent  . Adrenal mass 03/226    Benign  . Lupus Anticoagulant Positive   . Clotting disorder     +beta-2-glycoprotein IgA antibody  . History of cocaine abuse     Remote history   . Heart murmur   . Hypertension     takes meds daily  . Depression   . Shortness of breath     exertion or lying flat  . 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 Sleep apnea     2l of oxygen at night  . On home oxygen therapy     at night  . Diabetes mellitus     120s usually fasting  . GERD (gastroesophageal reflux disease)   . Dizziness, nonspecific   . Arthritis     knees/multiple orthopedic conditons; lower back  . History of blood transfusion   . Fibromyalgia    Past Surgical History  Procedure Laterality Date  . Carpal tunnel release       Bilateral  . Abdominal hysterectomy  1989    Fibroids  . Back surgery      cervical spine---disk disease  . Cesarean section    . Replacement total knee  11/17/2008    bilateral  . Right elbow surgery    . Tubal ligation    . Gallstones removed    . Anterior lumbar fusion  01/03/2012    Procedure: ANTERIOR LUMBAR FUSION 1 LEVEL;  Surgeon: Winfield Cunas, MD;  Location: [facility];  Service: Neurosurgery;  Laterality: N/A;  Lumbar Four-Five Anterior Lumbar Interbody Fusion with Instrumentation   Family History  Problem Relation Age of Onset  . Hypertension Father   . Heart failure Father   . Hypertension Mother   . Diabetes Mother   . Cancer Mother     Pancreatic   History  Substance Use Topics  . Smoking status: Former Smoker    Start date: 03/20/2001    Quit date: 03/21/2003  . Smokeless tobacco: Never Used     Comment: smoked for 2 years  . Alcohol Use: No   OB History    No data available     Review of Systems  Constitutional: Positive for chills and appetite change. Negative for fever and fatigue.  HENT: Negative for trouble swallowing.   Eyes: Negative for visual disturbance.  Respiratory: Negative for shortness of breath.   Cardiovascular: Negative for chest pain and palpitations.  Gastrointestinal: Positive for nausea and abdominal pain. Negative for vomiting and diarrhea.  Genitourinary: Negative for dysuria and difficulty urinating.  Musculoskeletal: Negative for arthralgias and neck pain.  Skin: Negative for color change.  Neurological: Negative for dizziness and weakness.  Psychiatric/Behavioral: Negative for dysphoric mood.      Allergies  Corticosteroids  Home Medications   Prior to Admission medications   Medication Sig Start Date End Date Taking? Authorizing Provider  albuterol (PROVENTIL HFA;VENTOLIN HFA) 108 (90 BASE) MCG/ACT inhaler Inhale 2 puffs into the lungs every 4 (four) hours as needed. For shortness of breath    Historical  Provider, MD  amLODipine (NORVASC) 5 MG tablet Take 5 mg by mouth daily.     Historical Provider, MD  budesonide-formoterol (SYMBICORT) 80-4.5 MCG/ACT inhaler Inhale 2 puffs into the lungs 2 (two) times daily. 08/21/13   Erline Hau, MD  cloNIDine (CATAPRES) 0.1 MG tablet Take 0.1 mg by mouth 2 (two) times daily.     Historical Provider, MD  diltiazem (CARDIZEM CD) 180 MG 24 hr capsule Take 180 mg by mouth daily.     Historical Provider, MD  DULoxetine (CYMBALTA) 60 MG capsule Take 60 mg by mouth daily.     Historical Provider, MD  ferrous sulfate 325 (65 FE) MG tablet Take 1 tablet (325 mg total) by mouth 2 (two) times daily with a meal. 08/21/13   Erline Hau, MD  gabapentin (NEURONTIN) 300 MG capsule Take 300 mg by mouth Three times a day.  11/24/11   Historical Provider, MD  hydrochlorothiazide (MICROZIDE) 12.5 MG capsule Take 12.5 mg by mouth daily.    Historical Provider, MD  HYDROcodone-acetaminophen (NORCO) 7.5-325 MG per tablet Take 1 tablet by mouth every 6 (six) hours as needed for moderate pain. 08/21/13   Erline Hau, MD  lisinopril (PRINIVIL,ZESTRIL) 5 MG tablet Take 1 tablet (5 mg total) by mouth daily. 08/21/13   Erline Hau, MD  loratadine (CLARITIN) 10 MG tablet Take 10 mg by mouth daily as needed. For allergies.    Historical Provider, MD  metFORMIN (GLUCOPHAGE) 500 MG tablet Take 500 mg by mouth at bedtime.     Historical Provider, MD  omeprazole (PRILOSEC) 20 MG capsule Take 20 mg by mouth 2 (two) times daily.    Historical Provider, MD  orphenadrine (NORFLEX) 100 MG tablet Take 1 tablet (100 mg total) by mouth 2 (two) times daily. 02/17/13   Elisha Headland, NP  oxyCODONE-acetaminophen (PERCOCET/ROXICET) 5-325 MG per tablet Take 1 tablet by mouth every 4 (four) hours as needed for moderate pain or severe pain. 02/09/14   Shaune Pollack, MD  polyethylene glycol (MIRALAX / Floria Raveling) packet Take 17 g by mouth daily.     Historical Provider,  MD  pravastatin (PRAVACHOL) 40 MG tablet Take 1 tablet by mouth daily. 02/18/12   Historical Provider, MD  QUEtiapine (SEROQUEL) 50 MG tablet Take 50 mg by mouth 2 (two) times daily.     Historical Provider, MD  Rubber Goods (ENEMA BOTTLE) MISC Place 1 Bottle rectally as needed.    Historical Provider, MD  traMADol (ULTRAM) 50 MG tablet Take 100 mg by mouth every 6 (six) hours as needed for moderate pain.    Historical Provider, MD  warfarin (COUMADIN) 10 MG tablet Take 1 tablet (10mg ) daily. 03/16/14   Ladell Pier, MD  warfarin (COUMADIN) 4 MG tablet TAKE 1 TABLET BY MOUTH ONCE DAILY WITH 5 MG TABLETS AS DIRECTED 01/22/14  Ladell Pier, MD  warfarin (COUMADIN) 5 MG tablet Take 1/2 tablet (2.5 mg) on Tuesdays, Thursdays, and Saturdays with Coumadin 10 mg (total dose 12.5 mg). 03/16/14   Ladell Pier, MD   BP 141/88 mmHg  Pulse 99  Temp(Src) 98.5 F (36.9 C) (Oral)  Resp 20  SpO2 97% Physical Exam  Constitutional: She is oriented to person, place, and time. She appears well-developed and well-nourished. No distress.  HENT:  Head: Normocephalic and atraumatic.  Eyes: Conjunctivae and EOM are normal.  Neck: Normal range of motion.  Cardiovascular: Normal rate and regular rhythm.  Exam reveals no gallop and no friction rub.   No murmur heard. Pulmonary/Chest: Effort normal and breath sounds normal. She has no wheezes. She has no rales. She exhibits no tenderness.  Abdominal: Soft. She exhibits no distension. There is tenderness. There is no rebound.  RLQ tenderness to palpation at McBurney's point. No other focal tenderness or peritoneal signs.   Musculoskeletal: Normal range of motion.  Neurological: She is alert and oriented to person, place, and time. Coordination normal.  Speech is Harkless-oriented. Moves limbs without ataxia.   Skin: Skin is warm and dry.  Psychiatric: She has a normal mood and affect. Her behavior is normal.  Nursing note and vitals reviewed.   ED Course   Procedures (including critical care time) Labs Review Labs Reviewed  CBC WITH DIFFERENTIAL/PLATELET - Abnormal; Notable for the following:    Hemoglobin 11.9 (*)    HCT 35.7 (*)    RDW 16.9 (*)    Neutrophils Relative % 78 (*)    Neutro Abs 7.9 (*)    All other components within normal limits  COMPREHENSIVE METABOLIC PANEL - Abnormal; Notable for the following:    Sodium 133 (*)    Potassium 2.9 (*)    Glucose, Bld 158 (*)    Creatinine, Ser 1.21 (*)    Calcium 8.2 (*)    GFR calc non Af Amer 48 (*)    GFR calc Af Amer 56 (*)    All other components within normal limits  PROTIME-INR - Abnormal; Notable for the following:    Prothrombin Time 61.4 (*)    INR 7.08 (*)    All other components within normal limits  CBG MONITORING, ED - Abnormal; Notable for the following:    Glucose-Capillary 146 (*)    All other components within normal limits  LIPASE, BLOOD  URINALYSIS, ROUTINE W REFLEX MICROSCOPIC    Imaging Review Ct Abdomen Pelvis W Contrast  04/24/2014   CLINICAL DATA:  Right lower quadrant and flank pain since yesterday.  EXAM: CT ABDOMEN AND PELVIS WITH CONTRAST  TECHNIQUE: Multidetector CT imaging of the abdomen and pelvis was performed using the standard protocol following bolus administration of intravenous contrast.  CONTRAST:  69mL OMNIPAQUE IOHEXOL 300 MG/ML SOLN, 139mL OMNIPAQUE IOHEXOL 300 MG/ML SOLN  COMPARISON:  CT scan 02/09/2014  FINDINGS: Lower chest: The lung bases are clear except for dependent atelectasis. The heart is mildly enlarged. No pericardial effusion. The distal esophagus is grossly normal.  Hepatobiliary: No worrisome hepatic lesions. Small cysts are noted. The gallbladder surgically absent. No common bile duct dilatation.  Pancreas: No inflammatory changes or mass lesion.  Spleen: Normal size.  No focal lesions.  Adrenals/Urinary Tract: Stable right adrenal gland adenoma since 2013. This is a lipid poor adenoma. The left adrenal gland is normal. The left  kidney is normal. The right kidney demonstrates high-grade obstructive findings with decreased perfusion and marked perinephric  interstitial changes. There is hydroureteronephrosis without obvious obstructing ureteral calculus. There may be a tiny calculus in the right ureter on axial image number 50. There is high attenuation material in the collecting system and upper ureter which could represent hemorrhage. Tumor cannot be excluded. Recommend urology consultation and possible retrograde study.  Stomach/Bowel: The stomach, duodenum, small bowel and colon are unremarkable. No inflammatory changes, mass lesions or obstructive findings. The appendix is normal.  Vascular/Lymphatic: No mesenteric or retroperitoneal mass or lymphadenopathy. Small scattered lymph nodes are noted. The aorta is normal in caliber. Mild tortuosity. The branch vessels are patent. The major venous structures are patent.  Reproductive: The uterus is surgically absent. Both ovaries are still present and appear stable. No pelvic mass or lymphadenopathy. No inguinal mass or adenopathy. The bladder appears normal.  Other: No abdominal wall hernia or subcutaneous lesions.  Musculoskeletal: No significant bony findings. Stable surgical changes at L4-5.  IMPRESSION: 1. High-grade obstruction of the right kidney as discussed above. There is a tiny possible ureteral calculus just above the iliac crest level but I doubt this is causing the obstruction. High attenuation material in the collecting system and ureter could reflect blood. Could not exclude tumor. Recommend urology consultation and possible retrograde study. 2. Stable right adrenal gland lesion. 3. No other significant abdominal/pelvic findings.   Electronically Signed   By: Kalman Jewels M.D.   On: 04/24/2014 20:08     EKG Interpretation None      MDM   Final diagnoses:  Obstruction of kidney  Hypokalemia  Right lower quadrant abdominal pain    6:37 PM Labs and urinalysis  pending. Patient will have CT abdomen pelvis to rule out appendicitis. Vitals stable and patient afebrile. Patient given morphine and zofran for symptoms.   9:05 PM Patient has a high grade obstruction of the right kidney with high attenuation material in the collecting system, which could represent blood in the setting on INR of 7. Patient has mildly elevated creatinine at 1.21. Urine pending. Vitals stable and patient afebrile. I spoke with Dr. Tresa Moore of Urology who recommends hospitalist admission and he will see her in the morning. Dr. Sherral Hammers will admit the patient.    Alvina Chou, PA-C 04/24/14 2113  Richarda Blade, MD 04/25/14 [phone]

## 2014-04-24 NOTE — ED Notes (Signed)
Per EMS-states RLQ pain that started yesterday-states increased today as she was lying down-patient states it is a "hard" pain

## 2014-04-24 NOTE — ED Notes (Signed)
Pt states she is still unable to void

## 2014-04-24 NOTE — Consult Note (Signed)
Reason for Consult: Right hydronephrosis, Right Adrenal Adenoma, Gross Hematuria  Referring Physician: e. Eulis Foster MD  [name] is an 59 y.o. female.   HPI:   1 - Right hydronephrosis / Flank Pain - acute on chronic right flank pain and new moderate right hydro with high density material (likely blood) in collecting system by contrast CT 04/24/2014.  This is new since recent prior study 01/2014. Questionable tiny punctate stone, though this may be in gonadal vein as appear similar to prior studies.  2 - Right Adrenal Adenoma - 3 cm right adrenal mass stable by imaging since 2013. No h/o refractory hypertension.  3 - , Gross Hematuria - new gross hematuria 04/2014. Prior smoker. She has h/o DVR / Lupu anticoagulant now on chronic coumadin.  4 - Acute Renal Failure - Cr 2.02 04/25/14 up from baseline <1. Right hydro as per above, admits to poor PO intake as well.   PMH sig for DVT/PE, COPD, obesity, DM2, chole. Her PCP is Elroy Channel MD.  Today [name] is seen in consultation for above. She is referred by E. Eulis Foster MD with [facility].   Past Medical History  Diagnosis Date  . DVT (deep venous thrombosis)     Recurrent  . Adrenal mass 03/226    Benign  . Lupus Anticoagulant Positive   . Clotting disorder     +beta-2-glycoprotein IgA antibody  . History of cocaine abuse     Remote history   . Heart murmur   . Hypertension     takes meds daily  . Depression   . Shortness of breath     exertion or lying flat  . 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 Sleep apnea     2l of oxygen at night  . On home oxygen therapy     at night  . Diabetes mellitus     120s usually fasting  . GERD (gastroesophageal reflux disease)   . Dizziness, nonspecific   . Arthritis     knees/multiple orthopedic conditons; lower back  . History of blood transfusion   . Fibromyalgia   . Asthma     per pt    Past Surgical History  Procedure Laterality Date  .  Carpal tunnel release      Bilateral  . Abdominal hysterectomy  1989    Fibroids  . Back surgery      cervical spine---disk disease  . Cesarean section    . Replacement total knee  11/17/2008    bilateral  . Right elbow surgery    . Tubal ligation    . Gallstones removed    . Anterior lumbar fusion  01/03/2012    Procedure: ANTERIOR LUMBAR FUSION 1 LEVEL;  Surgeon: Winfield Cunas, MD;  Location: [facility];  Service: Neurosurgery;  Laterality: N/A;  Lumbar Four-Five Anterior Lumbar Interbody Fusion with Instrumentation    Family History  Problem Relation Age of Onset  . Hypertension Father   . Heart failure Father   . Hypertension Mother   . Diabetes Mother   . Cancer Mother     Pancreatic    Social History:  reports that she quit smoking about 11 years ago. She started smoking about 13 years ago. She has never used smokeless tobacco. She reports that she does not drink alcohol or use illicit drugs.  Allergies:  Allergies  Allergen Reactions  . Corticosteroids Rash    States she developed a  rash on her tongue after a steroid injection in her back    Medications: I have reviewed the patient's current medications.  Results for orders placed or performed during the hospital encounter of 04/24/14 (from the past 48 hour(s))  CBG monitoring, ED     Status: Abnormal   Collection Time: 04/24/14  4:42 PM  Result Value Ref Range   Glucose-Capillary 146 (H) 70 - 99 mg/dL  CBC with Differential/Platelet     Status: Abnormal   Collection Time: 04/24/14  5:01 PM  Result Value Ref Range   WBC 10.3 4.0 - 10.5 K/uL   RBC 4.38 3.87 - 5.11 MIL/uL   Hemoglobin 11.9 (L) 12.0 - 15.0 g/dL   HCT 35.7 (L) 36.0 - 46.0 %   MCV 81.5 78.0 - 100.0 fL   MCH 27.2 26.0 - 34.0 pg   MCHC 33.3 30.0 - 36.0 g/dL   RDW 16.9 (H) 11.5 - 15.5 %   Platelets 351 150 - 400 K/uL   Neutrophils Relative % 78 (H) 43 - 77 %   Neutro Abs 7.9 (H) 1.7 - 7.7 K/uL   Lymphocytes Relative 13 12 - 46 %   Lymphs Abs  1.4 0.7 - 4.0 K/uL   Monocytes Relative 9 3 - 12 %   Monocytes Absolute 1.0 0.1 - 1.0 K/uL   Eosinophils Relative 0 0 - 5 %   Eosinophils Absolute 0.0 0.0 - 0.7 K/uL   Basophils Relative 0 0 - 1 %   Basophils Absolute 0.0 0.0 - 0.1 K/uL  Comprehensive metabolic panel     Status: Abnormal   Collection Time: 04/24/14  5:01 PM  Result Value Ref Range   Sodium 133 (L) 135 - 145 mmol/L   Potassium 2.9 (L) 3.5 - 5.1 mmol/L   Chloride 96 96 - 112 mmol/L   CO2 26 19 - 32 mmol/L   Glucose, Bld 158 (H) 70 - 99 mg/dL   BUN 14 6 - 23 mg/dL   Creatinine, Ser 1.21 (H) 0.50 - 1.10 mg/dL   Calcium 8.2 (L) 8.4 - 10.5 mg/dL   Total Protein 8.2 6.0 - 8.3 g/dL   Albumin 4.2 3.5 - 5.2 g/dL   AST 24 0 - 37 U/L   ALT 20 0 - 35 U/L   Alkaline Phosphatase 92 39 - 117 U/L   Total Bilirubin 0.7 0.3 - 1.2 mg/dL   GFR calc non Af Amer 48 (L) >90 mL/min   GFR calc Af Amer 56 (L) >90 mL/min    Comment: (NOTE) The eGFR has been calculated using the CKD EPI equation. This calculation has not been validated in all clinical situations. eGFR's persistently <90 mL/min signify possible Chronic Kidney Disease.    Anion gap 11 5 - 15  Lipase, blood     Status: None   Collection Time: 04/24/14  5:01 PM  Result Value Ref Range   Lipase 18 11 - 59 U/L  Protime-INR     Status: Abnormal   Collection Time: 04/24/14  5:08 PM  Result Value Ref Range   Prothrombin Time 61.4 (H) 11.6 - 15.2 seconds    Comment: REPEATED TO VERIFY SPECIMEN CHECKED FOR CLOTS    INR 7.08 (HH) 0.00 - 1.49    Comment: REPEATED TO VERIFY SPECIMEN CHECKED FOR CLOTS CRITICAL RESULT CALLED TO, READ BACK BY AND VERIFIED WITH: DOUGLEY,F. RN _0  ON 2.5.15 BY MCCOY,N. CALLED ON 2.5.2016     Ct Abdomen Pelvis W Contrast  04/24/2014   CLINICAL DATA:  Right lower quadrant and flank pain since yesterday.  EXAM: CT ABDOMEN AND PELVIS WITH CONTRAST  TECHNIQUE: Multidetector CT imaging of the abdomen and pelvis was performed using the standard  protocol following bolus administration of intravenous contrast.  CONTRAST:  36m OMNIPAQUE IOHEXOL 300 MG/ML SOLN, 1075mOMNIPAQUE IOHEXOL 300 MG/ML SOLN  COMPARISON:  CT scan 02/09/2014  FINDINGS: Lower chest: The lung bases are clear except for dependent atelectasis. The heart is mildly enlarged. No pericardial effusion. The distal esophagus is grossly normal.  Hepatobiliary: No worrisome hepatic lesions. Small cysts are noted. The gallbladder surgically absent. No common bile duct dilatation.  Pancreas: No inflammatory changes or mass lesion.  Spleen: Normal size.  No focal lesions.  Adrenals/Urinary Tract: Stable right adrenal gland adenoma since 2013. This is a lipid poor adenoma. The left adrenal gland is normal. The left kidney is normal. The right kidney demonstrates high-grade obstructive findings with decreased perfusion and marked perinephric interstitial changes. There is hydroureteronephrosis without obvious obstructing ureteral calculus. There may be a tiny calculus in the right ureter on axial image number 50. There is high attenuation material in the collecting system and upper ureter which could represent hemorrhage. Tumor cannot be excluded. Recommend urology consultation and possible retrograde study.  Stomach/Bowel: The stomach, duodenum, small bowel and colon are unremarkable. No inflammatory changes, mass lesions or obstructive findings. The appendix is normal.  Vascular/Lymphatic: No mesenteric or retroperitoneal mass or lymphadenopathy. Small scattered lymph nodes are noted. The aorta is normal in caliber. Mild tortuosity. The branch vessels are patent. The major venous structures are patent.  Reproductive: The uterus is surgically absent. Both ovaries are still present and appear stable. No pelvic mass or lymphadenopathy. No inguinal mass or adenopathy. The bladder appears normal.  Other: No abdominal wall hernia or subcutaneous lesions.  Musculoskeletal: No significant bony findings.  Stable surgical changes at L4-5.  IMPRESSION: 1. High-grade obstruction of the right kidney as discussed above. There is a tiny possible ureteral calculus just above the iliac crest level but I doubt this is causing the obstruction. High attenuation material in the collecting system and ureter could reflect blood. Could not exclude tumor. Recommend urology consultation and possible retrograde study. 2. Stable right adrenal gland lesion. 3. No other significant abdominal/pelvic findings.   Electronically Signed   By: MaKalman Jewels.D.   On: 04/24/2014 20:08    Review of Systems  Constitutional: Negative for fever and chills.  HENT: Negative.   Eyes: Negative.   Respiratory: Negative.   Cardiovascular: Negative.   Gastrointestinal: Negative.   Genitourinary: Positive for hematuria and flank pain.  Musculoskeletal: Negative.   Skin: Negative.   Neurological: Negative.   Endo/Heme/Allergies: Negative.   Psychiatric/Behavioral: Negative.    Blood pressure 163/95, pulse 88, temperature 98.5 F (36.9 C), temperature source Oral, resp. rate 24, SpO2 95 %. Physical Exam  Constitutional:  Ill-appearing, obese,using CPAP  Neck: Normal range of motion.  Cardiovascular: Normal rate.   Respiratory: Effort normal.  GI: Soft.  Genitourinary:  Mild Rt CVAT  Musculoskeletal: Normal range of motion.  Neurological: She is alert.  Skin: Skin is warm.  Psychiatric: She has a normal mood and affect. Her behavior is normal. Judgment and thought content normal.    Assessment/Plan:  1 - Right hydronephrosis / Flank Pain - unclear etiology by imaging alone. DDX spontanous bleed / clot in setting supratheraputic IVR v. Urothelial malignancy v. Tiny stone. Will need cysto / retrograde diagnostic ureteroscopy and stent when INR <2. It is  highly likely that this may even need to be repeated if blood contents in GU tract make endoscopic vision problematic.   INR still too high today, suspect will be OK for  tomorrow and will try to arrange for cysto, right retrograde, right ureteroscopy possible biopsy / possible stone extraction 04/26/14. NPO p MN tonight.   2 - Right Adrenal Adenoma -likely benign adenoma by size and stability. Rec random cortisol and random serum metanephrines to help r/o functional lesion.   3 -  Gross Hematuria - Axial imaging with rt hydro and no obvious large GU lesions. Will need furhter eval with cysto / retrogrades, right ureteroscopy as per above.   4 - Acute Renal Failure - likely multifactorial with obstruction (rt hydro) and pre-renal (poor recent PO intake).  Will follow, call with quetsions anytime.   Mathayus Stanbery 04/24/2014, 9:11 PM

## 2014-04-24 NOTE — ED Notes (Signed)
Pt was unable to void at this time

## 2014-04-24 NOTE — H&P (Signed)
[facility] History and Physical  Billye Pickerel Bernat JKD:[id number] DOB: September 26, 1955 DOA: 04/24/2014  Referring physician:  PCP: Philis Fendt, MD  Specialists:   Chief Complaint: Abdominal pain starting yesterday/RLQ     HPI: [name] is a 59 y.o. 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 The pain is located in the RLQ and does not radiate. The pain is described as aching and severe. The pain started gradually and progressively worsened since the onset. No alleviating/aggravating factors. The patient has tried oxycodone for symptoms without relief. Associated symptoms include chills and decreased appetite. Patient denies fever, headache, NVD, chest pain, SOB, dysuria, constipation, abnormal vaginal bleeding/discharge. She reports normal bowel movements. She reports history of gallstone removal and denies any abdominal surgery. States nose is she has chronic diastolic CHF but does not know her baseline weight. ALSO complains of bilateral lower extremity swelling which is out of proportion to her baseline swelling. Positive bilateral lower extremity pain. Positive abdominal pain RLQ/CVA 8/10 sharp    Review of Systems: The patient denies anorexia, fever, weight loss,, vision loss, decreased hearing, hoarseness, chest pain, syncope, dyspnea on exertion, peripheral edema, balance deficits, hemoptysis, melena, hematochezia, severe indigestion/heartburn, hematuria, incontinence, genital sores, muscle weakness, suspicious skin lesions, transient blindness, depression, unusual weight change, abnormal bleeding, enlarged lymph nodes, angioedema, and breast masses.    TRAVEL HISTORY:    Consultants:  Dr. Hubbard Robinson B. Loleta Rose  (urology) telephone consult by ED staff     Procedure/Significant Events:  2/5 CT abdomen pelvis with contrast :-High-grade obstruction  right kidney -Tiny possible ureteral calculus just above the iliac crest level  -attenuation material in the collecting system and ureter could reflect blood.  - Stable right adrenal gland lesion.   Culture  2/5 urine pending   Antibiotics:     DVT prophylaxis:  Heparin per pharmacy   Devices     LINES / TUBES:     Past Medical History  Diagnosis Date  . DVT (deep venous thrombosis)     Recurrent  . Adrenal mass 03/226    Benign  . Lupus Anticoagulant Positive   . Clotting disorder     +beta-2-glycoprotein IgA antibody  . History of cocaine abuse     Remote history   . Heart murmur   . Hypertension     takes meds daily  . Depression   . Shortness of breath     exertion or lying flat  . 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 Sleep apnea     2l of oxygen at night  . On home oxygen therapy     at night  . Diabetes mellitus     120s usually fasting  . GERD (gastroesophageal reflux disease)   . Dizziness, nonspecific   . Arthritis     knees/multiple orthopedic conditons; lower back  . History of blood transfusion   . Fibromyalgia   . Asthma     per pt   Past Surgical History  Procedure Laterality Date  . Carpal tunnel release      Bilateral  . Abdominal hysterectomy  1989    Fibroids  . Back surgery      cervical spine---disk disease  . Cesarean section    .  Replacement total knee  11/17/2008    bilateral  . Right elbow surgery    . Tubal ligation    . Gallstones removed    . Anterior lumbar fusion  01/03/2012    Procedure: ANTERIOR LUMBAR FUSION 1 LEVEL;  Surgeon: Winfield Cunas, MD;  Location: [facility];  Service: Neurosurgery;  Laterality: N/A;  Lumbar Four-Five Anterior Lumbar Interbody Fusion with Instrumentation   Social History:  reports that she quit  smoking about 11 years ago. She started smoking about 13 years ago. She has never used smokeless tobacco. She reports that she does not drink alcohol or use illicit drugs. where does patient live--home,   Can patient participate in ADLs? Yes  Allergies  Allergen Reactions  . Corticosteroids Rash    States she developed a rash on her tongue after a steroid injection in her back    Family History  Problem Relation Age of Onset  . Hypertension Father   . Heart failure Father   . Hypertension Mother   . Diabetes Mother   . Cancer Mother     Pancreatic    Prior to Admission medications   Medication Sig Start Date End Date Taking? Authorizing Provider  albuterol (PROVENTIL HFA;VENTOLIN HFA) 108 (90 BASE) MCG/ACT inhaler Inhale 2 puffs into the lungs every 4 (four) hours as needed. For shortness of breath   Yes Historical Provider, MD  amLODipine (NORVASC) 5 MG tablet Take 5 mg by mouth daily.    Yes Historical Provider, MD  budesonide-formoterol (SYMBICORT) 80-4.5 MCG/ACT inhaler Inhale 2 puffs into the lungs 2 (two) times daily. 08/21/13  Yes Erline Hau, MD  cloNIDine (CATAPRES) 0.1 MG tablet Take 0.1 mg by mouth 2 (two) times daily.    Yes Historical Provider, MD  diltiazem (CARDIZEM CD) 180 MG 24 hr capsule Take 180 mg by mouth daily.    Yes Historical Provider, MD  DULoxetine (CYMBALTA) 60 MG capsule Take 60 mg by mouth daily.    Yes Historical Provider, MD  ferrous sulfate 325 (65 FE) MG tablet Take 1 tablet (325 mg total) by mouth 2 (two) times daily with a meal. 08/21/13  Yes Estela Leonie Green, MD  gabapentin (NEURONTIN) 300 MG capsule Take 300 mg by mouth Three times a day.  11/24/11  Yes Historical Provider, MD  hydrochlorothiazide (MICROZIDE) 12.5 MG capsule Take 12.5 mg by mouth daily.   Yes Historical Provider, MD  HYDROcodone-acetaminophen (NORCO) 7.5-325 MG per tablet Take 1 tablet by mouth every 6 (six) hours as needed for moderate pain. 08/21/13  Yes Erline Hau, MD  lisinopril (PRINIVIL,ZESTRIL) 5 MG tablet Take 1 tablet (5 mg total) by mouth daily. 08/21/13  Yes Erline Hau, MD  loratadine (CLARITIN) 10 MG tablet Take 10 mg by mouth daily as needed. For allergies.   Yes Historical Provider, MD  metFORMIN (GLUCOPHAGE) 500 MG tablet Take 500 mg by mouth at bedtime.    Yes Historical Provider, MD  omeprazole (PRILOSEC) 20 MG capsule Take 20 mg by mouth 2 (two) times daily.   Yes Historical Provider, MD  orphenadrine (NORFLEX) 100 MG tablet Take 1 tablet (100 mg total) by mouth 2 (two) times daily. 02/17/13  Yes Elisha Headland, NP  oxyCODONE-acetaminophen (PERCOCET/ROXICET) 5-325 MG per tablet Take 1 tablet by mouth every 4 (four) hours as needed for moderate pain or severe pain. 02/09/14  Yes Shaune Pollack, MD  polyethylene glycol (MIRALAX / GLYCOLAX) packet Take 17 g by mouth  daily.    Yes Historical Provider, MD  pravastatin (PRAVACHOL) 40 MG tablet Take 1 tablet by mouth daily. 02/18/12  Yes Historical Provider, MD  QUEtiapine (SEROQUEL) 50 MG tablet Take 50 mg by mouth 2 (two) times daily.    Yes Historical Provider, MD  [employer] (ENEMA BOTTLE) MISC Place 1 Bottle rectally as needed (constipation.).    Yes Historical Provider, MD  traMADol (ULTRAM) 50 MG tablet Take 100 mg by mouth every 6 (six) hours as needed for moderate pain.   Yes Historical Provider, MD  warfarin (COUMADIN) 10 MG tablet Take 1 tablet (10mg ) daily. Patient taking differently: Take 10 mg by mouth daily at 6 PM. Take 1 tablet (10mg ) daily. Take with 2.5 mg to equal 12.5 mg daily. 03/16/14  Yes Ladell Pier, MD  warfarin (COUMADIN) 5 MG tablet Take 1/2 tablet (2.5 mg) on Tuesdays, Thursdays, and Saturdays with Coumadin 10 mg (total dose 12.5 mg). Patient taking differently: Take 2.5 mg by mouth daily. Take with 10 mg to equal 12.5 mg daily. 03/16/14  Yes Ladell Pier, MD  warfarin (COUMADIN) 4 MG tablet TAKE 1 TABLET BY MOUTH ONCE DAILY WITH 5 MG  TABLETS AS DIRECTED Patient not taking: Reported on 04/24/2014 01/22/14   Ladell Pier, MD   Physical Exam: Filed Vitals:   04/24/14 1630 04/24/14 1700 04/24/14 1907 04/24/14 2119  BP: 141/88 150/92 163/95 153/93  Pulse: 99 92 88 94  Temp: 98.5 F (36.9 C)   98.3 F (36.8 C)  TempSrc: Oral   Oral  Resp: 20 24 24 18   SpO2: 97% 98% 95% 93%     General:  A/O 4, positive moderate pain secondary to abdominal mass,  Eyes: Pupils equal round reactive to light and accommodation  Neck: Negative lymphadenopathy  Cardiovascular: Regular rhythm and rate, negative murmurs rubs or gallops, normal S1 and S2  Respiratory: Clear to auscultation bilateral  Abdomen: Pain to palpation RLQ/right CVA   Skin: Negative lesions/lacerations/erythema   Musculoskeletal: Bilateral lower extremity edema, positive bilateral calf pain Rt> Lt, negative erythema, negative Homans sign  Neurologic: Cranial nerves II through XII intact, tongue/uvula midline, extremity strength 5/5, sensation intact throughout. Did not ambulate patient  Labs on Admission:  Basic Metabolic Panel:  Recent Labs Lab 04/24/14 1701  NA 133*  K 2.9*  CL 96  CO2 26  GLUCOSE 158*  BUN 14  CREATININE 1.21*  CALCIUM 8.2*   Liver Function Tests:  Recent Labs Lab 04/24/14 1701  AST 24  ALT 20  ALKPHOS 92  BILITOT 0.7  PROT 8.2  ALBUMIN 4.2    Recent Labs Lab 04/24/14 1701  LIPASE 18   No results for input(s): AMMONIA in the last 168 hours. CBC:  Recent Labs Lab 04/24/14 1701  WBC 10.3  NEUTROABS 7.9*  HGB 11.9*  HCT 35.7*  MCV 81.5  PLT 351   Cardiac Enzymes: No results for input(s): CKTOTAL, CKMB, CKMBINDEX, TROPONINI in the last 168 hours.  BNP (last 3 results) No results for input(s): BNP in the last 8760 hours.  ProBNP (last 3 results)  Recent Labs  08/17/13 0953  PROBNP 54.7    CBG:  Recent Labs Lab 04/24/14 1642  GLUCAP 146*    Radiological Exams on Admission: Ct Abdomen  Pelvis W Contrast  04/24/2014   CLINICAL DATA:  Right lower quadrant and flank pain since yesterday.  EXAM: CT ABDOMEN AND PELVIS WITH CONTRAST  TECHNIQUE: Multidetector CT imaging of the abdomen and pelvis was performed  using the standard protocol following bolus administration of intravenous contrast.  CONTRAST:  85mL OMNIPAQUE IOHEXOL 300 MG/ML SOLN, 154mL OMNIPAQUE IOHEXOL 300 MG/ML SOLN  COMPARISON:  CT scan 02/09/2014  FINDINGS: Lower chest: The lung bases are clear except for dependent atelectasis. The heart is mildly enlarged. No pericardial effusion. The distal esophagus is grossly normal.  Hepatobiliary: No worrisome hepatic lesions. Small cysts are noted. The gallbladder surgically absent. No common bile duct dilatation.  Pancreas: No inflammatory changes or mass lesion.  Spleen: Normal size.  No focal lesions.  Adrenals/Urinary Tract: Stable right adrenal gland adenoma since 2013. This is a lipid poor adenoma. The left adrenal gland is normal. The left kidney is normal. The right kidney demonstrates high-grade obstructive findings with decreased perfusion and marked perinephric interstitial changes. There is hydroureteronephrosis without obvious obstructing ureteral calculus. There may be a tiny calculus in the right ureter on axial image number 50. There is high attenuation material in the collecting system and upper ureter which could represent hemorrhage. Tumor cannot be excluded. Recommend urology consultation and possible retrograde study.  Stomach/Bowel: The stomach, duodenum, small bowel and colon are unremarkable. No inflammatory changes, mass lesions or obstructive findings. The appendix is normal.  Vascular/Lymphatic: No mesenteric or retroperitoneal mass or lymphadenopathy. Small scattered lymph nodes are noted. The aorta is normal in caliber. Mild tortuosity. The branch vessels are patent. The major venous structures are patent.  Reproductive: The uterus is surgically absent. Both ovaries are  still present and appear stable. No pelvic mass or lymphadenopathy. No inguinal mass or adenopathy. The bladder appears normal.  Other: No abdominal wall hernia or subcutaneous lesions.  Musculoskeletal: No significant bony findings. Stable surgical changes at L4-5.  IMPRESSION: 1. High-grade obstruction of the right kidney as discussed above. There is a tiny possible ureteral calculus just above the iliac crest level but I doubt this is causing the obstruction. High attenuation material in the collecting system and ureter could reflect blood. Could not exclude tumor. Recommend urology consultation and possible retrograde study. 2. Stable right adrenal gland lesion. 3. No other significant abdominal/pelvic findings.   Electronically Signed   By: Kalman Jewels M.D.   On: 04/24/2014 20:08    EKG: Pending   Assessment/Plan Principal Problem:   Right kidney mass Active Problems:   Hypokalemia   HTN (hypertension)   Bilateral deep vein thromboses   COPD exacerbation   Morbid obesity   Obstruction of kidney   Diabetes type 2, uncontrolled   Chronic anticoagulation   Crack cocaine use   Depression   Fibromyalgia   COPD (chronic obstructive pulmonary disease)   Obstructive sleep apnea on CPAP   Lupus anticoagulant positive   Adrenal mass, right   Hydronephrosis of right kidney   Supratherapeutic INR   Right kidney mass -Dr. Hubbard Robinson B. Loleta Rose (urology) telephone consult by ED staff will see patient in a.m. in place cystoscopy tube if INR< 1.5 -Continue Dilaudid 1 mg q 3 hr PRN pain -Strict I&O -Urinalysis -Urine culture and stain -Nothing by mouth after midnight  Right adrenal mass -See right kidney mass  Hydronephrosis right kidney -See right kidney mass - HTN -Metoprolol 12.5 mg BID -Continue lisinopril 5 mg, -DC HCTZ and amlodipine (not AHA guideline medications for heart failure) -Will continue clonidine 0.1 mg BID to avoid rebound HTN -Hold Cardizem 180 mg daily for  now  Chronic diastolic CHF -Patient had not been on the appropriate medication for CHF will start this hospitalization -See HTN  Hypokalemia -  Potassium Meares>4 -Received K-Dur 40 mEq 1 in ED, recheck potassium level -Check magnesium level  HLD -Check lipid panel -Continue pravastatin 40 mg daily  Diabetes type 2 controlled -2/5 hemoglobin A1c= 6.9  COPD -Albuterol PRN  q 4hr PRN SOB -Symbicort 80-4 0.5 g BID  OSA -CPAP nasal mask her respiratory  Crack cocaine use -Last use 2005 per patient  Depression -Continue Cymbalta 60 mg daily (note will also help with chronic pain) -Continue Neurontin 300 mg TID -Continue Seroquel 50 mg  BID (schizophrenia, bipolar?)  Fibromyalgia -See depression and chronic pain syndrome  Chronic pain syndrome -Continue Norco 7.5-325 mg QID PRN pain -IV morphine PRN Pain  Bilateral lower extremity DVT -Doppler ultrasound bilateral secondary to new swelling and pain  Supratherapeutic INR -Received vitamin K 5 mg in ED -Recheck INR in the a.m.  Bilateral DVT on chronic anticoagulation -Heparin per pharmacy, will DC at 0500 on 2/6  Lupus anticoagulant positive -See bilateral DVT         Code Status: Full Family Communication: Daughter present Disposition per urology)  Time spent 53 minutes  Allie Bossier [facility] Pager [phone]  If 7PM-7AM, please contact night-coverage [email or web address] Password [email or web address] 04/24/2014, 10:02 PM

## 2014-04-24 NOTE — ED Notes (Signed)
Pt on bedpan attempting to provide urine specimen.

## 2014-04-24 NOTE — ED Notes (Signed)
Pt aware of need for urine specimen. Unable to at this time

## 2014-04-24 NOTE — ED Notes (Signed)
Bed: WA18 Expected date:  Expected time:  Means of arrival:  Comments: EMS-abdominal pain 

## 2014-04-25 DIAGNOSIS — Z86718 Personal history of other venous thrombosis and embolism: Secondary | ICD-10-CM

## 2014-04-25 LAB — COMPREHENSIVE METABOLIC PANEL
ALT: 20 U/L (ref 0–35)
AST: 21 U/L (ref 0–37)
Albumin: 3.7 g/dL (ref 3.5–5.2)
Alkaline Phosphatase: 80 U/L (ref 39–117)
Anion gap: 9 (ref 5–15)
BUN: 15 mg/dL (ref 6–23)
CO2: 27 mmol/L (ref 19–32)
Calcium: 7.4 mg/dL — ABNORMAL LOW (ref 8.4–10.5)
Chloride: 95 mmol/L — ABNORMAL LOW (ref 96–112)
Creatinine, Ser: 2.02 mg/dL — ABNORMAL HIGH (ref 0.50–1.10)
GFR calc Af Amer: 30 mL/min — ABNORMAL LOW (ref >90)
GFR calc non Af Amer: 26 mL/min — ABNORMAL LOW (ref >90)
Glucose, Bld: 148 mg/dL — ABNORMAL HIGH (ref 70–99)
Potassium: 3.4 mmol/L — ABNORMAL LOW (ref 3.5–5.1)
Sodium: 131 mmol/L — ABNORMAL LOW (ref 135–145)
Total Bilirubin: 1.1 mg/dL (ref 0.3–1.2)
Total Protein: 7.3 g/dL (ref 6.0–8.3)

## 2014-04-25 LAB — CBC WITH DIFFERENTIAL/PLATELET
Basophils Absolute: 0 10*3/uL (ref 0.0–0.1)
Basophils Relative: 0 % (ref 0–1)
Eosinophils Absolute: 0.1 10*3/uL (ref 0.0–0.7)
Eosinophils Relative: 1 % (ref 0–5)
HCT: 31.9 % — ABNORMAL LOW (ref 36.0–46.0)
Hemoglobin: 10.1 g/dL — ABNORMAL LOW (ref 12.0–15.0)
Lymphocytes Relative: 22 % (ref 12–46)
Lymphs Abs: 1.7 10*3/uL (ref 0.7–4.0)
MCH: 26.4 pg (ref 26.0–34.0)
MCHC: 31.7 g/dL (ref 30.0–36.0)
MCV: 83.5 fL (ref 78.0–100.0)
Monocytes Absolute: 0.9 10*3/uL (ref 0.1–1.0)
Monocytes Relative: 11 % (ref 3–12)
Neutro Abs: 4.9 10*3/uL (ref 1.7–7.7)
Neutrophils Relative %: 66 % (ref 43–77)
Platelets: 278 10*3/uL (ref 150–400)
RBC: 3.82 MIL/uL — ABNORMAL LOW (ref 3.87–5.11)
RDW: 17.3 % — ABNORMAL HIGH (ref 11.5–15.5)
WBC: 7.6 10*3/uL (ref 4.0–10.5)

## 2014-04-25 LAB — MAGNESIUM: Magnesium: 2.1 mg/dL (ref 1.5–2.5)

## 2014-04-25 LAB — LIPID PANEL
Cholesterol: 230 mg/dL — ABNORMAL HIGH (ref 0–200)
HDL: 82 mg/dL (ref >39)
LDL Cholesterol: 128 mg/dL — ABNORMAL HIGH (ref 0–99)
Total CHOL/HDL Ratio: 2.8 RATIO
Triglycerides: 101 mg/dL (ref <150)
VLDL: 20 mg/dL (ref 0–40)

## 2014-04-25 LAB — PROTIME-INR
INR: 2.3 — ABNORMAL HIGH (ref 0.00–1.49)
INR: 1.92 — ABNORMAL HIGH (ref 0.00–1.49)
Prothrombin Time: 25.5 seconds — ABNORMAL HIGH (ref 11.6–15.2)
Prothrombin Time: 22.2 seconds — ABNORMAL HIGH (ref 11.6–15.2)

## 2014-04-25 LAB — GLUCOSE, CAPILLARY
Glucose-Capillary: 137 mg/dL — ABNORMAL HIGH (ref 70–99)
Glucose-Capillary: 139 mg/dL — ABNORMAL HIGH (ref 70–99)
Glucose-Capillary: 192 mg/dL — ABNORMAL HIGH (ref 70–99)

## 2014-04-25 LAB — APTT: aPTT: 60 seconds — ABNORMAL HIGH (ref 24–37)

## 2014-04-25 LAB — HEPARIN LEVEL (UNFRACTIONATED): Heparin Unfractionated: <0.1 IU/mL — ABNORMAL LOW (ref 0.30–0.70)

## 2014-04-25 LAB — CORTISOL: Cortisol, Plasma: 15.5 ug/dL

## 2014-04-25 MED ORDER — SODIUM CHLORIDE 0.9 % IV SOLN
INTRAVENOUS | Status: DC
  Administered 2014-04-25: 1000 mL via INTRAVENOUS
  Administered 2014-04-25: 15:00:00 via INTRAVENOUS

## 2014-04-25 MED ORDER — INSULIN ASPART 100 UNIT/ML ~~LOC~~ SOLN
0.0000 [IU] | Freq: Three times a day (TID) | SUBCUTANEOUS | Status: DC
Start: 2014-04-25 — End: 2014-04-30
  Administered 2014-04-25 (×2): 1 [IU] via SUBCUTANEOUS
  Administered 2014-04-26: 2 [IU] via SUBCUTANEOUS
  Administered 2014-04-27 – 2014-04-30 (×4): 1 [IU] via SUBCUTANEOUS

## 2014-04-25 MED ORDER — CETYLPYRIDINIUM CHLORIDE 0.05 % MT LIQD
7.0000 mL | Freq: Two times a day (BID) | OROMUCOSAL | Status: DC
  Administered 2014-04-25 – 2014-04-30 (×8): 7 mL via OROMUCOSAL

## 2014-04-25 MED ORDER — HYDROMORPHONE HCL 1 MG/ML IJ SOLN
1.0000 mg | INTRAMUSCULAR | Status: DC | PRN
  Administered 2014-04-26 – 2014-04-30 (×7): 1 mg via INTRAVENOUS
  Filled 2014-04-25 (×7): qty 1

## 2014-04-25 MED ORDER — HYDROCODONE-ACETAMINOPHEN 7.5-325 MG PO TABS
1.0000 | ORAL_TABLET | ORAL | Status: DC | PRN
  Administered 2014-04-25 – 2014-04-29 (×13): 1 via ORAL
  Filled 2014-04-25 (×14): qty 1

## 2014-04-25 MED ORDER — HEPARIN (PORCINE) IN NACL 100-0.45 UNIT/ML-% IJ SOLN
1250.0000 [IU]/h | INTRAMUSCULAR | Status: DC
  Administered 2014-04-25: 1250 [IU]/h via INTRAVENOUS
  Filled 2014-04-25: qty 250

## 2014-04-25 NOTE — Plan of Care (Signed)
Problem: Phase I Progression Outcomes [place]: OOB as tolerated unless otherwise ordered Outcome: Progressing oob to [facility] with assist

## 2014-04-25 NOTE — Progress Notes (Signed)
*  PRELIMINARY RESULTS* Vascular Ultrasound Lower extremity venous duplex has been completed.  Preliminary findings: No evidence of DVT.  Landry Mellow, RDMS, RVT  04/25/2014, 9:12 AM

## 2014-04-25 NOTE — Progress Notes (Signed)
RT placed pt on auto titrate CPAP 5-20cmH2O via nasal mask with 2L of oxygen bled in per home regimen. Sterile water was added for humidification. Pt is resting comfortably on CPAP at this time. RT will continue to monitor as needed.

## 2014-04-25 NOTE — Progress Notes (Signed)
Pt placed on CPAP with 2 LPM O2 bleed in.  Current settings are Auto CPAP 5-20 CMH20.  Pt tolerating well at this time, RT to monitor and assess as needed.

## 2014-04-25 NOTE — Progress Notes (Signed)
Patient had been sleeping since po electrolyte replacements had arrived to floor. Gave meds once awaken this am by lab.

## 2014-04-25 NOTE — Progress Notes (Addendum)
ANTICOAGULATION CONSULT NOTE - Initial Consult  Pharmacy Consult for heparin Indication: Lupus anticoagulant + and beta-2 glycoprotein IgA Ab +, history of bilateral DVT  Allergies  Allergen Reactions  . Corticosteroids Rash    States she developed a rash on her tongue after a steroid injection in her back    Patient Measurements: Height: 5\' 7"  (170.2 cm) Weight: 278 lb 7.1 oz (126.3 kg) IBW/kg (Calculated) : 61.6 Heparin Dosing Weight: 92 kg  Vital Signs: Temp: 98.2 F (36.8 C) (02/05 2229) Temp Source: Oral (02/05 2229) BP: 137/87 mmHg (02/05 2229) Pulse Rate: 82 (02/06 0059)  Labs:  Recent Labs  04/24/14 1701 04/24/14 1708 04/25/14 0430  HGB 11.9*  --  10.1*  HCT 35.7*  --  31.9*  PLT 351  --  278  LABPROT  --  61.4* 25.5*  INR  --  7.08* 2.30*  CREATININE 1.21*  --  2.02*    Estimated Creatinine Clearance: 41.9 mL/min (by C-G formula based on Cr of 2.02).   Medical History: Past Medical History  Diagnosis Date  . DVT (deep venous thrombosis)     Recurrent  . Adrenal mass 03/226    Benign  . Lupus Anticoagulant Positive   . Clotting disorder     +beta-2-glycoprotein IgA antibody  . History of cocaine abuse     Remote history   . Heart murmur   . Hypertension     takes meds daily  . Depression   . Shortness of breath     exertion or lying flat  . 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 Sleep apnea     2l of oxygen at night  . On home oxygen therapy     at night  . Diabetes mellitus     120s usually fasting  . GERD (gastroesophageal reflux disease)   . Dizziness, nonspecific   . Arthritis     knees/multiple orthopedic conditons; lower back  . History of blood transfusion   . Fibromyalgia   . Asthma     per pt    Medications:  Scheduled:  . aspirin EC  81 mg Oral Daily  . budesonide-formoterol  2 puff Inhalation BID  . cloNIDine  0.1 mg Oral BID  . DULoxetine  60 mg Oral Daily  . ferrous  sulfate  325 mg Oral BID WC  . gabapentin  300 mg Oral TID  . lisinopril  5 mg Oral Daily  . metoprolol tartrate  12.5 mg Oral BID  . pravastatin  40 mg Oral Daily  . QUEtiapine  50 mg Oral BID   Infusions:  . sodium chloride 75 mL/hr at 04/25/14 0443    Assessment: 58yo F presented 2/5 with RLQ pain and hematuria. Found to have hydronephrosis with likely blood in the collecting system, possible small stone. On chronic Coumadin for hx Lupus anticoagulant and bilateral DVT. INR was 7.08 on admission. She was given Vit.K 5mg  IV x 1 and pharmacy was asked to start heparin to bridge to planned urologic surgery on 2/8.   INR was 2.3, drawn ~6 hrs after Vit. K. CBC ok. SCr 1.21 -> 2.02.  Cope of Therapy:  Heparin level 0.3-0.7 units/ml Monitor platelets by anticoagulation protocol: Yes   Plan:  Will check another INR at 1200 and start heparin conservatively if <2.  Romeo Rabon, PharmD, pager [phone]. 04/25/2014,7:37 AM.  Addendum: Baseline aPTT elevated at 60, not sure of the significance of this. LFTs were  wnl this am. INR is 1.92.  Start heparin with no bolus at a conservative dose of 1250units/hr. Check heparin level in 8hrs d/t elevated SCr.   Romeo Rabon, PharmD, pager [phone]. 04/25/2014,12:58 PM.

## 2014-04-25 NOTE — Progress Notes (Signed)
Patient ID: [name], female   DOB: 07/25/1955, 59 y.o.   MRN: [id number]  [facility] PROGRESS NOTE  [name] HQI:[id number] DOB: 1955/06/13 DOA: 04/24/2014 PCP: Colleen Fendt, MD   Brief narrative:    59 y.o. 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   Assessment/Plan:    Principal Problem: Right hydronephrosis / Flank Pain -  new moderate right hydro with ? Bleeding vs clot in the setting of supra therapeutic INR, can not exclude malignancy  - Will need cysto / retrograde diagnostic ureteroscopy and stent when INR <2 - may be able to have cysto, right retrograde, right ureteroscopy possible biopsy / possible stone extraction 04/26/14 - keep NPO after midnight  Right Adrenal Adenoma  - 3 cm right adrenal mass stable by imaging since 2013 - cortisol and metanephrine levels requested as recommended per urologist  Gross Hematuria  - new gross hematuria 04/2014. Prior smoker - management per urology team  Acute Renal Failure/ Hyponatremia  - Cr 2.02 04/25/14 up from baseline <1 - continue to provide IVF, stop metformin and lisinopril as both are contraindicated with ARF - repeat BMP in AM Bilateral LE swelling  - this could be related to diastolic CHF - pt has known history of chronic diastolic CHF per last 2 D ECHO in 2015 - EF 55 % at that time but grade II diastolic dysfunction  - will monitor daily weights, strict I' and O's - weight today is 126.3 kg - no need for repeat 2 D ECHO at this time  Chronic diastolic CHF - with now LE edema but no crackles on exam - monitor closely   Hypokalemia, hypomagnesemia  - continue to supplement and repeat BMP in AM - Mg supplemented and WNL this AM HTN (hypertension) - continue Clonidine and Metoprolol, but hold lisinopril as noted above  Bilateral deep vein thromboses - doppler done on admission with no evidence of DVT - one dose of vitK given already but INR still too high per urology to proceed with intervention - Hep per pharmacy  Anemia of chronic disease, DM, uncontrolled  - Hg 10.1 this AM - repeat CBC in AM COPD exacerbation/ seep apnea  - stable from respiratory status - provide oxygen via [place]  - CPAP at night time  Morbid obesity, Body mass index is 43.6  Diabetes type 2, uncontrolled with complications of neuropathy - last A1C we have in the record is 6.9, 8 months ago - will hold Metformin due to renal failure - place on SSI for now  - continue neurontin   DVT prophylaxis: hep drip for now prior to intervention   Code Status: Full.  Family Communication:  plan of care discussed with the patient Disposition Plan: Home when stable.   IV access:  Peripheral IV  Procedures and diagnostic studies:    Ct Abdomen Pelvis W Contrast  04/24/2014   High-grade obstruction of the right kidney as discussed above. There is a tiny possible ureteral calculus just above the iliac crest level but I doubt this is causing the obstruction. High attenuation material in the collecting system and ureter could reflect blood. Could not exclude tumor. Recommend urology consultation and  possible retrograde study. 2. Stable right adrenal gland lesion. 3. No other significant abdominal/pelvic findings.    Medical Consultants:  Urology   Other Consultants:  None   IAnti-Infectives:   None  Faye Ramsay, MD  [facility] Pager [phone]  If 7PM-7AM, please contact night-coverage [email or web address] Password [email or web address] 04/25/2014, 7:54 AM   LOS: 1 day   HPI/Subjective: No events overnight.   Objective: Filed Vitals:   04/24/14 2229  04/24/14 2358 04/25/14 0059 04/25/14 0729  BP: 137/87   90/64  Pulse: 95  82 80  Temp: 98.2 F (36.8 C)   97.6 F (36.4 C)  TempSrc: Oral   Oral  Resp: 20  20 20   Height: 5\' 7"  (1.702 m)     Weight: 126.3 kg (278 lb 7.1 oz)     SpO2: 98% 97% 100% 98%    Intake/Output Summary (Last 24 hours) at 04/25/14 0754 Last data filed at 04/25/14 0038  Gross per 24 hour  Intake      0 ml  Output    250 ml  Net   -250 ml    Exam:   General:  Pt is alert, follows commands appropriately, not in acute distress  Cardiovascular: Regular rate and rhythm,  no rubs, no gallops  Respiratory: Clear to auscultation bilaterally, no wheezing, no crackles, no rhonchi  Abdomen: Soft, non tender, non distended, bowel sounds present, no guarding  Extremities: bilateral LE pitting edema, pulses DP and PT palpable bilaterally  Neuro: Grossly nonfocal  Data Reviewed: Basic Metabolic Panel:  Recent Labs Lab 04/24/14 1701 04/24/14 2245 04/25/14 0430  NA 133*  --  131*  K 2.9* 3.4* 3.4*  CL 96  --  95*  CO2 26  --  27  GLUCOSE 158*  --  148*  BUN 14  --  15  CREATININE 1.21*  --  2.02*  CALCIUM 8.2*  --  7.4*  MG  --  1.2* 2.1   Liver Function Tests:  Recent Labs Lab 04/24/14 1701 04/25/14 0430  AST 24 21  ALT 20 20  ALKPHOS 92 80  BILITOT 0.7 1.1  PROT 8.2 7.3  ALBUMIN 4.2 3.7    Recent Labs Lab 04/24/14 1701  LIPASE 18   CBC:  Recent Labs Lab 04/24/14 1701 04/25/14 0430  WBC 10.3 7.6  NEUTROABS 7.9* 4.9  HGB 11.9* 10.1*  HCT 35.7* 31.9*  MCV 81.5 83.5  PLT 351 278   CBG:  Recent Labs Lab 04/24/14 1642  GLUCAP 146*   Scheduled Meds: . aspirin EC  81 mg Oral Daily  . budesonide-formoterol  2 puff Inhalation BID  . cloNIDine  0.1 mg Oral BID  . DULoxetine  60 mg Oral Daily  . ferrous sulfate  325 mg Oral BID WC  . gabapentin  300 mg Oral TID  . lisinopril  5 mg Oral Daily  . metoprolol tartrate  12.5 mg Oral BID  . pravastatin  40 mg Oral Daily  .  QUEtiapine  50 mg Oral BID   Continuous Infusions: . sodium chloride 75 mL/hr at 04/25/14 0443

## 2014-04-25 NOTE — Progress Notes (Addendum)
Paged NP on call Adanya Sosinski about level of care order being telemetry. EKG done shows NSR. NP gave order to d/c telemetry and keep on [address] [place]. Duplicate orders for ua and urine culture also d/c'd, both had been done in ED. Orders received for K+ and Mg+ replacements.

## 2014-04-26 ENCOUNTER — Encounter (HOSPITAL_COMMUNITY): Payer: Self-pay | Admitting: Anesthesiology

## 2014-04-26 ENCOUNTER — Inpatient Hospital Stay (HOSPITAL_COMMUNITY): Payer: Medicare Other | Admitting: Anesthesiology

## 2014-04-26 ENCOUNTER — Encounter (HOSPITAL_COMMUNITY)
Admission: EM | Disposition: A | Discharge status: Home / Self Care | Payer: Self-pay | Source: Home / Self Care | Attending: Internal Medicine

## 2014-04-26 HISTORY — PX: CYSTOSCOPY/RETROGRADE/URETEROSCOPY/STONE EXTRACTION WITH BASKET: SHX5317

## 2014-04-26 LAB — CBC
HCT: 28.3 % — ABNORMAL LOW (ref 36.0–46.0)
Hemoglobin: 8.9 g/dL — ABNORMAL LOW (ref 12.0–15.0)
MCH: 26.6 pg (ref 26.0–34.0)
MCHC: 31.4 g/dL (ref 30.0–36.0)
MCV: 84.5 fL (ref 78.0–100.0)
Platelets: 282 10*3/uL (ref 150–400)
RBC: 3.35 MIL/uL — ABNORMAL LOW (ref 3.87–5.11)
RDW: 17.5 % — ABNORMAL HIGH (ref 11.5–15.5)
WBC: 5.7 10*3/uL (ref 4.0–10.5)

## 2014-04-26 LAB — PROTIME-INR
INR: 1.75 — ABNORMAL HIGH (ref 0.00–1.49)
Prothrombin Time: 20.6 seconds — ABNORMAL HIGH (ref 11.6–15.2)

## 2014-04-26 LAB — GLUCOSE, CAPILLARY
Glucose-Capillary: 115 mg/dL — ABNORMAL HIGH (ref 70–99)
Glucose-Capillary: 117 mg/dL — ABNORMAL HIGH (ref 70–99)
Glucose-Capillary: 111 mg/dL — ABNORMAL HIGH (ref 70–99)
Glucose-Capillary: 158 mg/dL — ABNORMAL HIGH (ref 70–99)
Glucose-Capillary: 125 mg/dL — ABNORMAL HIGH (ref 70–99)

## 2014-04-26 LAB — BASIC METABOLIC PANEL
Anion gap: 9 (ref 5–15)
BUN: 27 mg/dL — ABNORMAL HIGH (ref 6–23)
CO2: 27 mmol/L (ref 19–32)
Calcium: 7.6 mg/dL — ABNORMAL LOW (ref 8.4–10.5)
Chloride: 99 mmol/L (ref 96–112)
Creatinine, Ser: 2.57 mg/dL — ABNORMAL HIGH (ref 0.50–1.10)
GFR calc Af Amer: 23 mL/min — ABNORMAL LOW (ref >90)
GFR calc non Af Amer: 19 mL/min — ABNORMAL LOW (ref >90)
Glucose, Bld: 152 mg/dL — ABNORMAL HIGH (ref 70–99)
Potassium: 3.6 mmol/L (ref 3.5–5.1)
Sodium: 135 mmol/L (ref 135–145)

## 2014-04-26 LAB — SURGICAL PCR SCREEN
MRSA, PCR: NEGATIVE
Staphylococcus aureus: NEGATIVE

## 2014-04-26 LAB — URINE CULTURE
Colony Count: NO GROWTH
Culture: NO GROWTH

## 2014-04-26 SURGERY — CYSTOSCOPY, WITH CALCULUS REMOVAL USING BASKET
Anesthesia: General | Site: Ureter | Laterality: Right

## 2014-04-26 MED ORDER — LACTATED RINGERS IV SOLN
INTRAVENOUS | Status: DC
  Administered 2014-04-26 – 2014-04-28 (×4): via INTRAVENOUS

## 2014-04-26 MED ORDER — CEFAZOLIN SODIUM-DEXTROSE 2-3 GM-% IV SOLR
2.0000 g | Freq: Once | INTRAVENOUS | Status: AC
  Administered 2014-04-26: 2 g via INTRAVENOUS

## 2014-04-26 MED ORDER — HYDROMORPHONE HCL 1 MG/ML IJ SOLN: 0.2500 mg | INTRAMUSCULAR | Status: DC | PRN

## 2014-04-26 MED ORDER — IOHEXOL 300 MG/ML  SOLN
INTRAMUSCULAR | Status: DC | PRN
  Administered 2014-04-26: 10 mL

## 2014-04-26 MED ORDER — LIDOCAINE HCL (CARDIAC) 20 MG/ML IV SOLN
INTRAVENOUS | Status: AC
  Filled 2014-04-26: qty 5

## 2014-04-26 MED ORDER — FENTANYL CITRATE 0.05 MG/ML IJ SOLN
INTRAMUSCULAR | Status: DC | PRN
  Administered 2014-04-26 (×2): 50 ug via INTRAVENOUS

## 2014-04-26 MED ORDER — LACTATED RINGERS IV SOLN
INTRAVENOUS | Status: DC | PRN
  Administered 2014-04-26: 07:00:00 via INTRAVENOUS

## 2014-04-26 MED ORDER — SENNOSIDES-DOCUSATE SODIUM 8.6-50 MG PO TABS
1.0000 | ORAL_TABLET | Freq: Two times a day (BID) | ORAL | Status: DC
  Administered 2014-04-26 – 2014-04-30 (×5): 1 via ORAL
  Filled 2014-04-26 (×10): qty 1

## 2014-04-26 MED ORDER — FUROSEMIDE 10 MG/ML IJ SOLN
40.0000 mg | Freq: Once | INTRAMUSCULAR | Status: AC
  Administered 2014-04-26: 40 mg via INTRAVENOUS
  Filled 2014-04-26: qty 4

## 2014-04-26 MED ORDER — MIDAZOLAM HCL 5 MG/5ML IJ SOLN
INTRAMUSCULAR | Status: DC | PRN
  Administered 2014-04-26 (×2): 1 mg via INTRAVENOUS

## 2014-04-26 MED ORDER — POLYETHYLENE GLYCOL 3350 17 G PO PACK
17.0000 g | PACK | Freq: Two times a day (BID) | ORAL | Status: DC
  Administered 2014-04-26 – 2014-04-28 (×4): 17 g via ORAL
  Filled 2014-04-26 (×10): qty 1

## 2014-04-26 MED ORDER — HYDROMORPHONE HCL 1 MG/ML IJ SOLN
INTRAMUSCULAR | Status: AC
  Filled 2014-04-26: qty 1

## 2014-04-26 MED ORDER — PROPOFOL 10 MG/ML IV BOLUS
INTRAVENOUS | Status: AC
  Filled 2014-04-26: qty 20

## 2014-04-26 MED ORDER — HEPARIN (PORCINE) IN NACL 100-0.45 UNIT/ML-% IJ SOLN
1500.0000 [IU]/h | INTRAMUSCULAR | Status: DC
  Administered 2014-04-26: 1500 [IU]/h via INTRAVENOUS
  Filled 2014-04-26: qty 250

## 2014-04-26 MED ORDER — LIDOCAINE HCL (PF) 2 % IJ SOLN
INTRAMUSCULAR | Status: DC | PRN
  Administered 2014-04-26: 20 mg via INTRADERMAL

## 2014-04-26 MED ORDER — CEFAZOLIN (ANCEF) 1 G IV SOLR: 1.0000 g | INTRAVENOUS | Status: DC

## 2014-04-26 MED ORDER — FENTANYL CITRATE 0.05 MG/ML IJ SOLN
INTRAMUSCULAR | Status: AC
  Filled 2014-04-26: qty 2

## 2014-04-26 MED ORDER — MIDAZOLAM HCL 2 MG/2ML IJ SOLN
INTRAMUSCULAR | Status: AC
  Filled 2014-04-26: qty 2

## 2014-04-26 MED ORDER — PROPOFOL 10 MG/ML IV BOLUS
INTRAVENOUS | Status: DC | PRN
  Administered 2014-04-26: 150 mg via INTRAVENOUS

## 2014-04-26 MED ORDER — PROMETHAZINE HCL 25 MG/ML IJ SOLN: 6.2500 mg | INTRAMUSCULAR | Status: DC | PRN

## 2014-04-26 MED ORDER — ONDANSETRON HCL 4 MG/2ML IJ SOLN
INTRAMUSCULAR | Status: AC
  Filled 2014-04-26: qty 2

## 2014-04-26 MED ORDER — SODIUM CHLORIDE 0.9 % IR SOLN
Status: DC | PRN
  Administered 2014-04-26: 3000 mL
  Administered 2014-04-26 (×2): 1000 mL

## 2014-04-26 MED ORDER — MEPERIDINE HCL 50 MG/ML IJ SOLN: 6.2500 mg | INTRAMUSCULAR | Status: DC | PRN

## 2014-04-26 MED ORDER — ONDANSETRON HCL 4 MG/2ML IJ SOLN
INTRAMUSCULAR | Status: DC | PRN
  Administered 2014-04-26: 4 mg via INTRAVENOUS

## 2014-04-26 MED ORDER — CEFAZOLIN SODIUM-DEXTROSE 2-3 GM-% IV SOLR
INTRAVENOUS | Status: AC
  Filled 2014-04-26: qty 50

## 2014-04-26 MED ORDER — HEPARIN (PORCINE) IN NACL 100-0.45 UNIT/ML-% IJ SOLN
1550.0000 [IU]/h | INTRAMUSCULAR | Status: DC
  Filled 2014-04-26 (×2): qty 250

## 2014-04-26 SURGICAL SUPPLY — 25 items
BAG URO CATCHER STRL LF (DRAPE) ×3 IMPLANT
BASKET LASER NITINOL 1.9FR (BASKET) IMPLANT
BRUSH URET BIOPSY 3F (UROLOGICAL SUPPLIES) ×2 IMPLANT
BSKT STON RTRVL 120 1.9FR (BASKET)
CATH INTERMIT  6FR 70CM (CATHETERS) ×3 IMPLANT
CLOTH BEACON ORANGE TIMEOUT ST (SAFETY) ×3 IMPLANT
FIBER LASER FLEXIVA 1000 (UROLOGICAL SUPPLIES) IMPLANT
FIBER LASER FLEXIVA 200 (UROLOGICAL SUPPLIES) IMPLANT
FIBER LASER FLEXIVA 365 (UROLOGICAL SUPPLIES) IMPLANT
FIBER LASER FLEXIVA 550 (UROLOGICAL SUPPLIES) IMPLANT
FIBER LASER TRAC TIP (UROLOGICAL SUPPLIES) IMPLANT
FORCEPS BIOP 2.4F 115CM BACKLD (INSTRUMENTS) ×2 IMPLANT
GLOVE BIOGEL M STRL SZ7.5 (GLOVE) ×3 IMPLANT
GOWN STRL REUS W/TWL LRG LVL3 (GOWN DISPOSABLE) ×3 IMPLANT
GUIDEWIRE ANG ZIPWIRE 038X150 (WIRE) ×3 IMPLANT
GUIDEWIRE STR DUAL SENSOR (WIRE) ×3 IMPLANT
IV NS 1000ML (IV SOLUTION) ×3
IV NS 1000ML BAXH (IV SOLUTION) ×2 IMPLANT
MANIFOLD NEPTUNE II (INSTRUMENTS) ×3 IMPLANT
PACK CYSTO (CUSTOM PROCEDURE TRAY) ×3 IMPLANT
SHEATH ACCESS URETERAL 38CM (SHEATH) ×2 IMPLANT
SHIELD EYE BINOCULAR (MISCELLANEOUS) IMPLANT
SYR CONTROL 10ML LL (SYRINGE) ×3 IMPLANT
TUBE FEEDING 8FR 16IN STR KANG (MISCELLANEOUS) ×3 IMPLANT
TUBING CONNECTING 10 (TUBING) ×3 IMPLANT

## 2014-04-26 NOTE — Progress Notes (Signed)
Day of Surgery  Subjective:  1 - Right hydronephrosis / Flank Pain - acute on chronic right flank pain and new moderate right hydro with high density material (likely blood) in collecting system by contrast CT 04/24/2014. This is new since recent prior study 01/2014. Questionable tiny punctate stone, though this may be in gonadal vein as appear similar to prior studies.  2 - Right Adrenal Adenoma - 3 cm right adrenal mass stable by imaging since 2013. No h/o refractory hypertension.  3 - , Gross Hematuria - new gross hematuria 04/2014. Prior smoker. She has h/o DVT / Lupu anticoagulant now on chronic coumadin.  4 - Acute Renal Failure - Cr 2.02 04/25/14 up from baseline <1. Right hydro as per above, admits to poor PO intake as well.   PMH sig for DVT/PE, COPD, obesity, DM2, chole. Her PCP is Elroy Channel MD.  Today Grettell is seen to proceed with cysto right retrograde, right ureteroscopy, and rigth ureteral stent placemtn for diagnostic and theraputic intent. INR now 1.75, Cr continues to rise. No fevers.  Objective: Vital signs in last 24 hours: Temp:  [97.6 F (36.4 C)-98.8 F (37.1 C)] 98.8 F (37.1 C) (02/07 0515) Pulse Rate:  [80] 80 (02/07 0515) Resp:  [16-20] 16 (02/07 0515) BP: (90-116)/(57-71) 116/71 mmHg (02/07 0515) SpO2:  [96 %-100 %] 100 % (02/07 0515) Weight:  [130.409 kg (287 lb 8 oz)] 130.409 kg (287 lb 8 oz) (02/07 0515) Last BM Date: 04/24/14  Intake/Output from previous day: 02/06 0701 - 02/07 0700 In: 750 [I.V.:750] Out: -  Intake/Output this shift:    General appearance: alert, cooperative, appears stated age and sister at bedside Nose: Nares normal. Septum midline. Mucosa normal. No drainage or sinus tenderness. Throat: lips, mucosa, and tongue normal; teeth and gums normal Neck: supple, symmetrical, trachea midline Back: symmetric, no curvature. ROM normal. No CVA tenderness. Resp: non-labored Cardio: nl rate GI: soft, non-tender; bowel sounds normal; no  masses,  no organomegaly Extremities: extremities normal, atraumatic, no cyanosis or edema Pulses: 2+ and symmetric Skin: Skin color, texture, turgor normal. No rashes or lesions Lymph nodes: Cervical, supraclavicular, and axillary nodes normal. Neurologic: Grossly normal  Lab Results:   Recent Labs  04/25/14 0430 04/26/14 0443  WBC 7.6 5.7  HGB 10.1* 8.9*  HCT 31.9* 28.3*  PLT 278 282   BMET  Recent Labs  04/25/14 0430 04/26/14 0443  NA 131* 135  K 3.4* 3.6  CL 95* 99  CO2 27 27  GLUCOSE 148* 152*  BUN 15 27*  CREATININE 2.02* 2.57*  CALCIUM 7.4* 7.6*   PT/INR  Recent Labs  04/25/14 1213 04/26/14 0443  LABPROT 22.2* 20.6*  INR 1.92* 1.75*   ABG No results for input(s): PHART, HCO3 in the last 72 hours.  Invalid input(s): PCO2, PO2  Studies/Results: Ct Abdomen Pelvis W Contrast  04/24/2014   CLINICAL DATA:  Right lower quadrant and flank pain since yesterday.  EXAM: CT ABDOMEN AND PELVIS WITH CONTRAST  TECHNIQUE: Multidetector CT imaging of the abdomen and pelvis was performed using the standard protocol following bolus administration of intravenous contrast.  CONTRAST:  57mL OMNIPAQUE IOHEXOL 300 MG/ML SOLN, 166mL OMNIPAQUE IOHEXOL 300 MG/ML SOLN  COMPARISON:  CT scan 02/09/2014  FINDINGS: Lower chest: The lung bases are clear except for dependent atelectasis. The heart is mildly enlarged. No pericardial effusion. The distal esophagus is grossly normal.  Hepatobiliary: No worrisome hepatic lesions. Small cysts are noted. The gallbladder surgically absent. No common bile duct dilatation.  Pancreas: No inflammatory changes or mass lesion.  Spleen: Normal size.  No focal lesions.  Adrenals/Urinary Tract: Stable right adrenal gland adenoma since 2013. This is a lipid poor adenoma. The left adrenal gland is normal. The left kidney is normal. The right kidney demonstrates high-grade obstructive findings with decreased perfusion and marked perinephric interstitial changes.  There is hydroureteronephrosis without obvious obstructing ureteral calculus. There may be a tiny calculus in the right ureter on axial image number 50. There is high attenuation material in the collecting system and upper ureter which could represent hemorrhage. Tumor cannot be excluded. Recommend urology consultation and possible retrograde study.  Stomach/Bowel: The stomach, duodenum, small bowel and colon are unremarkable. No inflammatory changes, mass lesions or obstructive findings. The appendix is normal.  Vascular/Lymphatic: No mesenteric or retroperitoneal mass or lymphadenopathy. Small scattered lymph nodes are noted. The aorta is normal in caliber. Mild tortuosity. The branch vessels are patent. The major venous structures are patent.  Reproductive: The uterus is surgically absent. Both ovaries are still present and appear stable. No pelvic mass or lymphadenopathy. No inguinal mass or adenopathy. The bladder appears normal.  Other: No abdominal wall hernia or subcutaneous lesions.  Musculoskeletal: No significant bony findings. Stable surgical changes at L4-5.  IMPRESSION: 1. High-grade obstruction of the right kidney as discussed above. There is a tiny possible ureteral calculus just above the iliac crest level but I doubt this is causing the obstruction. High attenuation material in the collecting system and ureter could reflect blood. Could not exclude tumor. Recommend urology consultation and possible retrograde study. 2. Stable right adrenal gland lesion. 3. No other significant abdominal/pelvic findings.   Electronically Signed   By: Kalman Jewels M.D.   On: 04/24/2014 20:08    Anti-infectives: Anti-infectives    Start     Dose/Rate Route Frequency Ordered Stop   04/26/14 0630  ceFAZolin (ANCEF) powder 1 g  Status:  Discontinued     1 g Other To Surgery 04/26/14 0618 04/26/14 0621   04/26/14 0630  ceFAZolin (ANCEF) IVPB 2 g/50 mL premix     2 g100 mL/hr over 30 Minutes Intravenous  Once  04/26/14 7371        Assessment/Plan:  1 - Right hydronephrosis / Flank Pain - unclear etiology by imaging alone. DDX spontanous bleed / clot in setting supratheraputic IVR v. Urothelial malignancy v. Tiny stone. Will need cysto / retrograde diagnostic ureteroscopy and stent when INR <2. It is highly likely that this may even need to be repeated if blood contents in GU tract make endoscopic vision problematic.   Will plan on OR today for above as CR rising and INR <2. Risks including bleeding, infection, damage to kidney / ureter / bladder, need for staged procedure as well as rare risks such as DVT,PT,MI,CVA,Mortality and anasthetic complications discussed.    2 - Right Adrenal Adenoma -likely benign adenoma by size and stability. Rec random cortisol and random serum metanephrines to help r/o functional lesion, obtained and pending.   3 - Gross Hematuria - Axial imaging with rt hydro and no obvious large GU lesions. Will need furhter eval with cysto / retrogrades, right ureteroscopy as per above.   4 - Acute Renal Failure - likely multifactorial with obstruction (rt hydro) and pre-renal (poor recent PO intake). Will plan on stent today ragardless of findings for renal decopression.     [facility], Tinzlee Craker 04/26/2014

## 2014-04-26 NOTE — Progress Notes (Signed)
In & out cath done with 600 ml of clear yellow urine resulting.

## 2014-04-26 NOTE — Progress Notes (Signed)
ANTICOAGULATION CONSULT NOTE - Follow up  Pharmacy Consult for heparin Indication: Lupus anticoagulant + and beta-2 glycoprotein IgA Ab +, history of bilateral DVT  Allergies  Allergen Reactions  . Corticosteroids Rash    States she developed a rash on her tongue after a steroid injection in her back    Patient Measurements: Height: 5\' 7"  (170.2 cm) Weight: 287 lb 8 oz (130.409 kg) IBW/kg (Calculated) : 61.6 Heparin Dosing Weight: 92 kg  Vital Signs: Temp: 98.3 F (36.8 C) (02/07 1002) Temp Source: Oral (02/07 0659) BP: 133/92 mmHg (02/07 1002) Pulse Rate: 88 (02/07 1002)  Labs:  Recent Labs  04/24/14 1701  04/25/14 0430 04/25/14 1213 04/25/14 2230 04/26/14 0443  HGB 11.9*  --  10.1*  --   --  8.9*  HCT 35.7*  --  31.9*  --   --  28.3*  PLT 351  --  278  --   --  282  APTT  --   --   --  60*  --   --   LABPROT  --   < > 25.5* 22.2*  --  20.6*  INR  --   < > 2.30* 1.92*  --  1.75*  HEPARINUNFRC  --   --   --   --  <0.10*  --   CREATININE 1.21*  --  2.02*  --   --  2.57*  < > = values in this interval not displayed.  Estimated Creatinine Clearance: 33.6 mL/min (by C-G formula based on Cr of 2.57).  Assessment: 59yo F presented 2/5 with RLQ pain and hematuria. Found to have hydronephrosis with likely blood in the collecting system, possible small stone. On chronic Coumadin for hx Lupus anticoagulant and bilateral DVT. INR was 7.08 on admission. She was given Vit.K 5mg  IV x 1 and pharmacy was asked to start heparin to bridge to planned urologic surgery.  Significant events:  2/6: INR 1.92. Heparin started conservatively. First HL <0.1 on 1250units/hr. Increased to 1550units/hr.  Today, 2/7:   Heparin stopped for surgery early this am.  Underwent cystoscopy/R ureteral and renal pelvis biopsy w/ AET = 0900.  Spoke with Dr. Tresa Moore, gave verbal order to resume heparin at 1600, but hold off on warfarin for now.  INR 1.75 this am.   H/H a little lower. Pltc ok. No  bleeding reported/documented.  SCr elevated further. CrCl ~80ml/min.  [place] of Therapy:  Heparin level 0.3-0.7 units/ml Monitor platelets by anticoagulation protocol: Yes   Plan:  Start heparin with no bolus at 1500units/hr. Check heparin level in 8hrs d/t elevated SCr.  Check heparin level and CBC q24h while on heparin. F/u daily.  Romeo Rabon, PharmD, pager [phone]. 04/26/2014,11:22 AM.

## 2014-04-26 NOTE — Progress Notes (Addendum)
ANTICOAGULATION CONSULT NOTE - Follow Up Consult  Pharmacy Consult for Heparin Indication: Lupus anticoagulant + and beta-2 glycoprotein IgA Ab +, history of bilateral DVT  Allergies  Allergen Reactions  . Corticosteroids Rash    States she developed a rash on her tongue after a steroid injection in her back    Patient Measurements: Height: 5\' 7"  (170.2 cm) Weight: 278 lb 7.1 oz (126.3 kg) IBW/kg (Calculated) : 61.6 Heparin Dosing Weight:   Vital Signs: Temp: 97.7 F (36.5 C) (02/06 1502) Temp Source: Oral (02/06 1502) BP: 103/57 mmHg (02/06 1502) Pulse Rate: 80 (02/06 1502)  Labs:  Recent Labs  04/24/14 1701 04/24/14 1708 04/25/14 0430 04/25/14 1213 04/25/14 2230  HGB 11.9*  --  10.1*  --   --   HCT 35.7*  --  31.9*  --   --   PLT 351  --  278  --   --   APTT  --   --   --  60*  --   LABPROT  --  61.4* 25.5* 22.2*  --   INR  --  7.08* 2.30* 1.92*  --   HEPARINUNFRC  --   --   --   --  <0.10*  CREATININE 1.21*  --  2.02*  --   --     Estimated Creatinine Clearance: 41.9 mL/min (by C-G formula based on Cr of 2.02).   Medications:  Infusions:  . sodium chloride 1,000 mL (04/25/14 2257)  . heparin      Assessment: Patient with low heparin level.  No issues per RN.   [place] of Therapy:  Heparin level 0.3-0.7 units/ml Monitor platelets by anticoagulation protocol: Yes   Plan:  Increase heparin to 1550 units/hr Recheck level at 0800  Tyler Deis, Shea Stakes [place] 04/26/2014,12:31 AM  RN called to say she received new orders to hold heparin prior to procedure.  I have removed planned 0800 heparin level, asked RN to call if heparin not stopped as planned.  Will follow up re: restart heparin.  Cyndia Diver PharmD, BCPS  04/26/2014, 5:22 AM

## 2014-04-26 NOTE — Op Note (Signed)
[name], [name]                   ACCOUNT NO.:  [id number]  MEDICAL RECORD NO.:  [id number]  LOCATION:  76                         FACILITY:  [facility]  PHYSICIAN:  Alexis Frock, MD     DATE OF BIRTH:  [birth year]  DATE OF PROCEDURE: 04/26/2014  DATE OF DISCHARGE:                              OPERATIVE REPORT   DIAGNOSES:  Right hydronephrosis, questionable renal pelvis, mass versus stone.  POSTOPERATIVE DIAGNOSES:  Right hydronephrosis, questionable renal pelvis, mass versus stone.  PROCEDURE: 1. Cystoscopy with right retrograde pyelogram and interpretation. 2. Right ureteral stent 5 x 24 Polaris, no tether. 3. Right ureteroscopy with ureteroscopic biopsy.  FINDINGS: 1. Mild right hydronephrosis without ureteral nephrosis. 2. Significant inflammation and erythema of the right proximal ureter     and renal pelvis. 3. Significant blood products within the right renal pelvis.  Overall     picture worrisome for possible recently passed stone versus early     urothelial cancer, representative areas biopsied. 4. Excellent placement of right ureteral stent, proximal end of the     renal pelvis, distal end of the urinary bladder.  INDICATIONS:  [name] is a pleasant 59 year old lady with history of medical comorbidity including chronic anticoagulation for DVT, COPD, diabetes, former smoker.  She has had a prolonged prodrome of right colicky flank pain, has been imaged several times, however, most recently with a new right hydronephrosis with likely blood products in the right renal pelvis without obvious obstructing mass or stone.  She has also had acute on chronic renal failure with rise in her creatinine up to 2.5, more today.  She has had no infectious parameters.  Options were discussed for further diagnostic and therapeutic intent including right ureteroscopy with stenting with [place] to help elucidate the etiology of her hydronephrosis treated with stent and possibly  biopsy and concern felt for malignancy and she wished to proceed.  Informed consent was obtained and placed in medical record.  The patient has been off of Coumadin and her INR is less than 2 this morning.  PROCEDURE IN DETAIL:  The patient being [name], procedure being right ureteroscopy with biopsy was confirmed.  Procedure was carried out. Time-out was performed.  Intravenous antibiotics administered.  General LMA anesthesia was reduced.  The patient was placed into a low lithotomy position.  Sterile field was created by prepping and draping the patient's vagina, introitus, and proximal thighs using iodine x3.  Next, cystourethroscopy was performed using 22-French rigid cystoscope with 30- offset lens.  Inspection of the urinary bladder revealed no diverticula, calcifications, papular lesions.  Ureteral orifices were singleton in the normal anatomic position bilaterally.  The right ureteral orifice was cannulated with 6-French Foley catheter and right retrograde pyelogram was obtained.  Right retrograde pyelogram demonstrated a single right ureter, single system right kidney.  There was only partial filling of the renal pelvis.  This was in the lower pole.  It was worrisome for possible large filling defect versus blood products.  There was mild hydronephrosis noted.  A 0.038 ZipWire was advanced at the level of the lower pole and set aside as a safety wire.  Next, a semi-rigid  ureteroscopy performed at the entire length of the right ureter alongside a separate Sensor working wire in the proximal ureter.  There was some shaggy erythema without obvious stricture in the proximal 1/4 of the ureter.  The semi-rigid ureteroscope was then exchanged for a 12/14 ureteral access sheath at the level of the proximal ureter over the Sensor working wire using continuous fluoroscopic guidance.  The feeding tube was placed in urinary bladder for pressure release.  Next, flexible digital  ureteroscopy was performed in the entire right kidney and inspected.  There was significant blood products within the renal pelvis, which made visualization suboptimal.  There was significant erythema and inflammatory changes throughout the urothelium of the right kidney, renal pelvis, and proximal ureter.  This was concerning for possible recent infection versus recent stone passage versus early urothelial carcinoma.  Next, a brush biopsy apparatus was carefully backloaded and brushings were taken of the right UPJ area and set aside for permanent specimen cytology.  Next, a BIGopsy apparatus was also backloaded and using combination of fluoroscopic and ureteroscopic guidance representative areas of the erythema and inflammation of the renal pelvis were biopsied and set aside for permanent formalin pathology.  Following this maneuver, hemostasis appeared excellent.  It was clearly felt that ureteral stenting was warranted given the hydronephrosis without ureteral nephrosis and the fact that the further intervention such as repeat diagnostic ureteroscopy very well may be needed in the future, it was felt that of course a stenting was warranted.  The access sheath was removed and under continuous ureteroscopic vision no mucosal abnormalities were found.  Finally, a new 5 x 24 Polaris-type stent was placed using cystoscopic and fluoroscopic guidance.  Good proximal and distal deployment were noted. Bladder was emptied per cystoscope procedure and terminated.  The patient tolerated the procedure well.  There were no immediate periprocedural complications.  The patient was taken to postanesthesia care unit in stable condition with plan for readmission to the hospitalist service and the findings of surgery today were communicated to the primary team.          ______________________________ Alexis Frock, MD     TM/MEDQ  D:  04/26/2014  T:  04/26/2014  Job:  [id number]

## 2014-04-26 NOTE — Anesthesia Preprocedure Evaluation (Addendum)
Anesthesia Evaluation  Patient identified by MRN, date of birth, ID band Patient awake    Reviewed: Allergy & Precautions, NPO status , Patient's Chart, lab work & pertinent test results, reviewed documented beta blocker date and time   Airway Mallampati: III  TM Distance: >3 FB Neck ROM: Full    Dental  (+) Missing, Poor Dentition   Pulmonary shortness of breath and at rest, asthma , sleep apnea and Continuous Positive Airway Pressure Ventilation , COPD COPD inhaler and oxygen dependent, former smoker,  breath sounds clear to auscultation  Pulmonary exam normal       Cardiovascular hypertension, Pt. on medications + Peripheral Vascular Disease and +CHF + Valvular Problems/Murmurs Rhythm:Regular Rate:Normal     Neuro/Psych PSYCHIATRIC DISORDERS Depression  Neuromuscular disease    GI/Hepatic GERD-  Medicated and Controlled,(+)     substance abuse  cocaine use,   Endo/Other  diabetes, Poorly Controlled, Type 2Morbid obesityHyperlipidemia  Renal/GU Renal diseaseRight renal mass Right adrenal mass     Musculoskeletal  (+) Arthritis -, Osteoarthritis,  Fibromyalgia -  Abdominal (+) + obese,   Peds  Hematology  (+) anemia , Hx/o lupus anticoagulant positive Anticoagulated on Coumadin admitted supratheraputic Hx/o Bilateral DVT   Anesthesia Other Findings   Reproductive/Obstetrics                          Anesthesia Physical Anesthesia Plan  ASA: III and emergent  Anesthesia Plan: General   Post-op Pain Management:    Induction: Intravenous  Airway Management Planned: LMA  Additional Equipment:   Intra-op Plan:   Post-operative Plan: Extubation in OR  Informed Consent: I have reviewed the patients History and Physical, chart, labs and discussed the procedure including the risks, benefits and alternatives for the proposed anesthesia with the patient or authorized representative who has  indicated his/her understanding and acceptance.   Dental advisory given  Plan Discussed with: CRNA, Anesthesiologist and Surgeon  Anesthesia Plan Comments:        Anesthesia Quick Evaluation

## 2014-04-26 NOTE — Progress Notes (Signed)
Spoke to OR to see if patient on schedule today. Patient on their schedule for 0730 with procedure with Dr Tresa Moore. Paged NP Walden Field on call about when to stop/hold heparin gtt. Orders received.

## 2014-04-26 NOTE — Brief Op Note (Signed)
04/24/2014 - 04/26/2014  8:43 AM  PATIENT:  [name]  59 y.o. female  PRE-OPERATIVE DIAGNOSIS:  right ureteral stone possible mass  POST-OPERATIVE DIAGNOSIS:  HYDRONEPHROSIS, URETERAL INFLAMMATION  PROCEDURE:  Procedure(s): CYSTOSCOPY/ RIGHT RETROGRADE/ RIGHT URETEROSCOPY/URETERAL AND RENAL PELVIS BIOPSY (Right)  SURGEON:  Surgeon(s) and Role:    * Alexis Frock, MD - Primary  PHYSICIAN ASSISTANT:   ASSISTANTS: none   ANESTHESIA:   general  EBL:     BLOOD ADMINISTERED:none  DRAINS: none   LOCAL MEDICATIONS USED:  NONE  SPECIMEN:  Source of Specimen:  1 - Rt UPJ / renal pelvis brush biopsy; 2 - Rt renal pelvis biopsy   DISPOSITION OF SPECIMEN:  PATHOLOGY  COUNTS:  YES  TOURNIQUET:  * No tourniquets in log *  DICTATION: .Other Dictation: Dictation Number [id number]  PLAN OF CARE: Admit to inpatient   PATIENT DISPOSITION:  PACU - hemodynamically stable.   Delay start of Pharmacological VTE agent (>24hrs) due to surgical blood loss or risk of bleeding: no

## 2014-04-26 NOTE — Transfer of Care (Signed)
Immediate Anesthesia Transfer of Care Note  Patient: [name]  Procedure(s) Performed: Procedure(s) (LRB): CYSTOSCOPY/ RIGHT RETROGRADE/ RIGHT URETEROSCOPY/URETERAL AND RENAL PELVIS BIOPSY (Right)  Patient Location: PACU  Anesthesia Type: General  Level of Consciousness: sedated, patient cooperative and responds to stimulation  Airway & Oxygen Therapy: Patient Spontanous Breathing and Patient connected to face mask oxgen  Post-op Assessment: Report given to PACU RN and Post -op Vital signs reviewed and stable  Post vital signs: Reviewed and stable  Complications: No apparent anesthesia complications

## 2014-04-26 NOTE — Progress Notes (Signed)
Patient has not voided since around 1600 yesterday 04/26/14-did bladder scan-shows 709 ml in bladder. Patient assisted oob to bsc still unable to void on own. Paged NP on call. Orders received to in & out cath.

## 2014-04-26 NOTE — Anesthesia Postprocedure Evaluation (Signed)
  Anesthesia Post-op Note  Patient: [name]  Procedure(s) Performed: Procedure(s): CYSTOSCOPY/ RIGHT RETROGRADE/ RIGHT URETEROSCOPY/URETERAL AND RENAL PELVIS BIOPSY (Right)  Patient Location: PACU  Anesthesia Type:General  Level of Consciousness: awake, alert  and oriented  Airway and Oxygen Therapy: Patient Spontanous Breathing and Patient connected to face mask oxygen  Post-op Pain: mild  Post-op Assessment: Post-op Vital signs reviewed, Patient's Cardiovascular Status Stable, Respiratory Function Stable, Patent Airway, No signs of Nausea or vomiting and Pain level controlled  Post-op Vital Signs: Reviewed and stable  Last Vitals:  Filed Vitals:   04/26/14 0930  BP: 147/91  Pulse:   Temp: 36.7 C  Resp:     Complications: No apparent anesthesia complications

## 2014-04-26 NOTE — Progress Notes (Signed)
Stopped heparin gtt per order. Surgery scheduled for 0730.

## 2014-04-26 NOTE — Progress Notes (Signed)
Patient ID: [name], female   DOB: [birth year], 59 y.o.   MRN: [id number]  [facility] PROGRESS NOTE  [name] WGY:[id number] DOB: Oct 05, 1955 DOA: 04/24/2014 PCP: Philis Fendt, MD  Brief narrative:    59 y.o. 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   Assessment/Plan:    Principal Problem: Right hydronephrosis / Flank Pain - s/p CYSTOSCOPY/ RIGHT RETROGRADE/ RIGHT URETEROSCOPY/URETERAL AND RENAL PELVIS BIOPSY 04/26/2014 - pt reports feeling better, follow up on pathology report  Right Adrenal Adenoma  - 3 cm right adrenal mass stable by imaging since 2013 - cortisol and metanephrine levels requested as recommended per urologist, pending  Gross Hematuria  - new gross hematuria 04/2014. Prior smoker - management per urology team  Acute Renal Failure/ Hyponatremia  - Cr trending up  - stopped metformin and lisinopril as both are contraindicated with ARF - Na is now WNL  - repeat BMP in AM Bilateral LE swelling  - this could be related to diastolic CHF - pt has known history of chronic diastolic CHF per last 2 D ECHO in 2015 - EF 55 % at that time but grade II diastolic dysfunction  - weight up from admission and pt with more crackles on exam, LE swelling - weight trend: 126 --> 130 kg this AM - give one dose of Lasix 40 mg IV and monitor clinical response  - no need for repeat 2 D ECHO at this time  Chronic diastolic CHF - with now LE edema, weight up as noted above - Lasix 40 mg IV given  - monitor closely  Hypokalemia, hypomagnesemia  - continue to supplement as indicated,  repeat BMP in AM - Mg supplemented and WNL this AM HTN (hypertension) - continue Clonidine and Metoprolol, but hold lisinopril as noted above  Bilateral deep vein thromboses - doppler done on admission with no evidence of DVT - hold Coumadin for additional 24 hours per urology team  Anemia of chronic disease, DM, uncontrolled  - Hg 8.9 this AM - repeat CBC in AM COPD exacerbation/ seep apnea  - provide oxygen via [place]  - CPAP at night time  Morbid obesity, Body mass index is 43.6  Diabetes type 2, uncontrolled with complications of neuropathy - last A1C we have in the record is 6.9, 8 months ago - will hold Metformin due to renal failure - place on SSI for now  - continue neurontin   DVT prophylaxis: resume Coumadin in AM  Code Status: Full.  Family Communication: plan of care discussed with the patient Disposition Plan: Home when pathology results back   IV access:  Peripheral IV  Procedures and diagnostic studies:   Ct Abdomen Pelvis W Contrast 04/24/2014 High-grade obstruction of the right kidney as discussed above. There is a tiny possible ureteral calculus just above the iliac crest level but I doubt this is causing the obstruction. High attenuation material in the collecting system and ureter could reflect blood. Could not exclude tumor. Recommend urology consultation and possible retrograde study. 2. Stable right adrenal gland lesion. 3. No other significant abdominal/pelvic findings.   Medical Consultants:  Urology   Other Consultants:  None   IAnti-Infectives:  None   Faye Ramsay, MD  [facility] Pager [phone]  If 7PM-7AM, please contact night-coverage [email or web address] Password [facility] 04/26/2014, 8:54 AM   LOS: 2 days   HPI/Subjective: No events overnight.   Objective: Filed Vitals:   04/25/14 2132 04/25/14 2135 04/26/14 0515 04/26/14 0659  BP: 125/55  116/71 122/71  Pulse: 92 90 80 81  Temp: 98.2 F (36.8 C)  98.8 F (37.1 C)  97.8 F (36.6 C)  TempSrc: Oral  Oral Oral  Resp: 20  16 16   Height:      Weight:   130.409 kg (287 lb 8 oz)   SpO2: 98%  100% 100%    Intake/Output Summary (Last 24 hours) at 04/26/14 0854 Last data filed at 04/26/14 0655  Gross per 24 hour  Intake   2520 ml  Output    725 ml  Net   1795 ml    Exam:   General:  Pt is alert, follows commands appropriately, not in acute distress  Cardiovascular: Regular rate and rhythm, S1/S2, no murmurs, no rubs, no gallops  Respiratory: Clear to auscultation bilaterally, no wheezing, crackles at bases   Abdomen: Soft, tender in LE quadrants, non distended, bowel sounds present, no guarding  Extremities: LE edema, pulses DP and PT palpable bilaterally  Neuro: Grossly nonfocal  Data Reviewed: Basic Metabolic Panel:  Recent Labs Lab 04/24/14 1701 04/24/14 2245 04/25/14 0430 04/26/14 0443  NA 133*  --  131* 135  K 2.9* 3.4* 3.4* 3.6  CL 96  --  95* 99  CO2 26  --  27 27  GLUCOSE 158*  --  148* 152*  BUN 14  --  15 27*  CREATININE 1.21*  --  2.02* 2.57*  CALCIUM 8.2*  --  7.4* 7.6*  MG  --  1.2* 2.1  --    Liver Function Tests:  Recent Labs Lab 04/24/14 1701 04/25/14 0430  AST 24 21  ALT 20 20  ALKPHOS 92 80  BILITOT 0.7 1.1  PROT 8.2 7.3  ALBUMIN 4.2 3.7    Recent Labs Lab 04/24/14 1701  LIPASE 18   CBC:  Recent Labs Lab 04/24/14 1701 04/25/14 0430 04/26/14 0443  WBC 10.3 7.6 5.7  NEUTROABS 7.9* 4.9  --   HGB 11.9* 10.1* 8.9*  HCT 35.7* 31.9* 28.3*  MCV 81.5 83.5 84.5  PLT 351 278 282   CBG:  Recent Labs Lab 04/24/14 1642 04/25/14 1202 04/25/14 1703 04/25/14 2150 04/26/14 0701  GLUCAP 146* 137* 139* 192* 115*    Recent Results (from the past 240 hour(s))  Surgical pcr screen     Status: None   Collection Time: 04/26/14  6:05 AM  Result Value Ref Range Status   MRSA, PCR NEGATIVE NEGATIVE Final   Staphylococcus aureus NEGATIVE NEGATIVE Final    Comment:        The Xpert SA Assay  (FDA approved for NASAL specimens in patients over 59 years of age), is one component of a comprehensive surveillance program.  Test performance has been validated by [facility] for patients greater than or equal to 75 year old. It is not intended to diagnose infection nor to guide or monitor treatment.      Scheduled Meds: . [MAR Hold] antiseptic oral rinse  7 mL Mouth Rinse BID  . [MAR Hold] aspirin EC  81 mg Oral Daily  . [MAR Hold] budesonide-formoterol  2 puff Inhalation BID  . [MAR Hold] cloNIDine  0.1 mg Oral BID  . [facility] Hold]  DULoxetine  60 mg Oral Daily  . [MAR Hold] ferrous sulfate  325 mg Oral BID WC  . [MAR Hold] gabapentin  300 mg Oral TID  . HYDROmorphone      . [MAR Hold] insulin aspart  0-9 Units Subcutaneous TID WC  . [MAR Hold] metoprolol tartrate  12.5 mg Oral BID  . [MAR Hold] pravastatin  40 mg Oral Daily  . [MAR Hold] QUEtiapine  50 mg Oral BID   Continuous Infusions: . heparin Stopped (04/26/14 0536)

## 2014-04-26 NOTE — Progress Notes (Signed)
Pt placed on Auto CPAP 5-20 CMH20 via FFM with 2 LPM O2 bleed in.  Pt tolerating well at this time, RT to monitor and assess as needed.  

## 2014-04-27 ENCOUNTER — Encounter (HOSPITAL_COMMUNITY): Payer: Self-pay | Admitting: Urology

## 2014-04-27 LAB — HEPARIN LEVEL (UNFRACTIONATED)
Heparin Unfractionated: 0.1 IU/mL — ABNORMAL LOW (ref 0.30–0.70)
Heparin Unfractionated: 0.35 IU/mL (ref 0.30–0.70)
Heparin Unfractionated: 0.35 IU/mL (ref 0.30–0.70)

## 2014-04-27 LAB — CBC
HCT: 28.8 % — ABNORMAL LOW (ref 36.0–46.0)
Hemoglobin: 9.1 g/dL — ABNORMAL LOW (ref 12.0–15.0)
MCH: 26.6 pg (ref 26.0–34.0)
MCHC: 31.6 g/dL (ref 30.0–36.0)
MCV: 84.2 fL (ref 78.0–100.0)
Platelets: 301 10*3/uL (ref 150–400)
RBC: 3.42 MIL/uL — ABNORMAL LOW (ref 3.87–5.11)
RDW: 17.6 % — ABNORMAL HIGH (ref 11.5–15.5)
WBC: 6.6 10*3/uL (ref 4.0–10.5)

## 2014-04-27 LAB — BASIC METABOLIC PANEL
Anion gap: 8 (ref 5–15)
BUN: 22 mg/dL (ref 6–23)
CO2: 29 mmol/L (ref 19–32)
Calcium: 7.6 mg/dL — ABNORMAL LOW (ref 8.4–10.5)
Chloride: 97 mmol/L (ref 96–112)
Creatinine, Ser: 1.5 mg/dL — ABNORMAL HIGH (ref 0.50–1.10)
GFR calc Af Amer: 43 mL/min — ABNORMAL LOW (ref >90)
GFR calc non Af Amer: 37 mL/min — ABNORMAL LOW (ref >90)
Glucose, Bld: 153 mg/dL — ABNORMAL HIGH (ref 70–99)
Potassium: 3.7 mmol/L (ref 3.5–5.1)
Sodium: 134 mmol/L — ABNORMAL LOW (ref 135–145)

## 2014-04-27 LAB — HEMOGLOBIN A1C
Hgb A1c MFr Bld: 6.6 % — ABNORMAL HIGH (ref 4.8–5.6)
Mean Plasma Glucose: 143 mg/dL

## 2014-04-27 LAB — GLUCOSE, CAPILLARY: Glucose-Capillary: 120 mg/dL — ABNORMAL HIGH (ref 70–99)

## 2014-04-27 MED ORDER — WARFARIN - PHARMACIST DOSING INPATIENT: Freq: Every day | Status: DC

## 2014-04-27 MED ORDER — HEPARIN (PORCINE) IN NACL 100-0.45 UNIT/ML-% IJ SOLN
1750.0000 [IU]/h | INTRAMUSCULAR | Status: DC
  Administered 2014-04-27 – 2014-04-28 (×3): 1750 [IU]/h via INTRAVENOUS
  Filled 2014-04-27 (×5): qty 250

## 2014-04-27 MED ORDER — WARFARIN SODIUM 10 MG PO TABS
10.0000 mg | ORAL_TABLET | Freq: Once | ORAL | Status: AC
  Administered 2014-04-27: 10 mg via ORAL
  Filled 2014-04-27: qty 1

## 2014-04-27 MED ORDER — BISACODYL 10 MG RE SUPP
10.0000 mg | Freq: Once | RECTAL | Status: AC
  Administered 2014-04-27: 10 mg via RECTAL
  Filled 2014-04-27: qty 1

## 2014-04-27 NOTE — Progress Notes (Signed)
ANTICOAGULATION CONSULT NOTE - Follow Up Consult  Pharmacy Consult for Heparin Indication: Lupus anticoagulant + and beta-2 glycoprotein IgA Ab +, history of bilateral DVT  Allergies  Allergen Reactions  . Corticosteroids Rash    States she developed a rash on her tongue after a steroid injection in her back    Patient Measurements: Height: 5\' 7"  (170.2 cm) Weight: 287 lb 8 oz (130.409 kg) IBW/kg (Calculated) : 61.6 Heparin Dosing Weight:   Vital Signs: Temp: 98.6 F (37 C) (02/07 2115) Temp Source: Oral (02/07 2115) BP: 121/78 mmHg (02/07 2115) Pulse Rate: 93 (02/07 2115)  Labs:  Recent Labs  04/25/14 0430 04/25/14 1213 04/25/14 2230 04/26/14 0443 04/27/14 0012 04/27/14 0013  HGB 10.1*  --   --  8.9*  --  9.1*  HCT 31.9*  --   --  28.3*  --  28.8*  PLT 278  --   --  282  --  301  APTT  --  60*  --   --   --   --   LABPROT 25.5* 22.2*  --  20.6*  --   --   INR 2.30* 1.92*  --  1.75*  --   --   HEPARINUNFRC  --   --  <0.10*  --  0.10*  --   CREATININE 2.02*  --   --  2.57*  --  1.50*    Estimated Creatinine Clearance: 57.5 mL/min (by C-G formula based on Cr of 1.5).   Medications:  Infusions:  . heparin    . lactated ringers 100 mL/hr at 04/27/14 0044    Assessment: Patient with low heparin level.  No issues per RN.  [place] of Therapy:  Heparin level 0.3-0.7 units/ml Monitor platelets by anticoagulation protocol: Yes   Plan:  Increase heparin to 1750 units/hr Recheck level at [place], Shea Stakes [place] 04/27/2014,2:16 AM

## 2014-04-27 NOTE — Progress Notes (Signed)
ANTICOAGULATION CONSULT NOTE - Follow up  Pharmacy Consult for heparin Indication: Lupus anticoagulant + and beta-2 glycoprotein IgA Ab +, history of bilateral DVT  Allergies  Allergen Reactions  . Corticosteroids Rash    States she developed a rash on her tongue after a steroid injection in her back    Patient Measurements: Height: 5\' 7"  (170.2 cm) Weight: 289 lb 6.4 oz (131.271 kg) IBW/kg (Calculated) : 61.6 Heparin Dosing Weight: 92 kg  Vital Signs: Temp: 98.8 F (37.1 C) (02/08 0558) Temp Source: Oral (02/08 0558) BP: 140/74 mmHg (02/08 0558) Pulse Rate: 87 (02/08 0558)  Labs:  Recent Labs  04/25/14 0430 04/25/14 1213 04/25/14 2230 04/26/14 0443 04/27/14 0012 04/27/14 0013 04/27/14 1024  HGB 10.1*  --   --  8.9*  --  9.1*  --   HCT 31.9*  --   --  28.3*  --  28.8*  --   PLT 278  --   --  282  --  301  --   APTT  --  60*  --   --   --   --   --   LABPROT 25.5* 22.2*  --  20.6*  --   --   --   INR 2.30* 1.92*  --  1.75*  --   --   --   HEPARINUNFRC  --   --  <0.10*  --  0.10*  --  0.35  CREATININE 2.02*  --   --  2.57*  --  1.50*  --     Estimated Creatinine Clearance: 57.8 mL/min (by C-G formula based on Cr of 1.5).  Assessment: 59yo F presented 2/5 with RLQ pain and hematuria. Found to have hydronephrosis with likely blood in the collecting system, possible small stone. On chronic Coumadin for hx Lupus anticoagulant and bilateral DVT. INR was 7.08 on admission. She was given Vit.K 5mg  IV x 1 and pharmacy was asked to start heparin to bridge to planned urologic surgery.  Significant events:  2/6: INR 1.92. Heparin started conservatively. First HL <0.1 on 1250units/hr. Increased to 1550units/hr. 2/7: Heparin held for cystoscopy with ureteral stent, resumed post-op  Today, 2/8:   H/H a little lower. Pltc ok. No bleeding reported/documented.  Renal function improving  No INR today, yesterday 1.75  SCDs on  Also on ASA 81mg  daily  [place] of Therapy:   Heparin level 0.3-0.7 units/ml Monitor platelets by anticoagulation protocol: Yes   Plan:  Continue heparin at 1750 units/hr ( = 17.75ml/hr) Check confirmation heparin level in 6 hours ( @ 1600) Daily HL CBC tomorrow AM F/u plans for resuming warfarin  Ralene Bathe, PharmD, BCPS 04/27/2014, 11:02 AM  Pager: [phone]

## 2014-04-27 NOTE — Progress Notes (Addendum)
Patient ID: Sirenia Whitis Maxim, female   DOB: [birth year], 59 y.o.   MRN: [id number]  [facility] PROGRESS NOTE  Adyn Hoes Calbert MGN:[id number] DOB: Aug 06, 1955 DOA: 04/24/2014 PCP: Philis Fendt, MD   Brief narrative:    59 y.o. 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   Assessment/Plan:    Principal Problem: Right hydronephrosis / Flank Pain - s/p CYSTOSCOPY/ RIGHT RETROGRADE/ RIGHT URETEROSCOPY/URETERAL AND RENAL PELVIS BIOPSY 04/26/2014 - pt reports feeling better, follow up on pathology report, still pending  Right Adrenal Adenoma  - 3 cm right adrenal mass stable by imaging since 2013 - cortisol and metanephrine levels requested as recommended per urologist, pending  Gross Hematuria  - new gross hematuria 04/2014. Prior smoker - management per urology team  Acute Renal Failure/ Hyponatremia  - stopped metformin and lisinopril as both are contraindicated with ARF - Cr is finally trending down  - repeat BMP in AM Bilateral LE swelling  - this could be related to diastolic CHF - pt has known history of chronic diastolic CHF per last 2 D ECHO in 2015 - EF 55 % at that time but grade II diastolic dysfunction  - weight up from admission and pt with more crackles on exam, LE swelling - weight trend: 126 --> 130 --> 131 kg this AM - placed on Lasix and will continue and monitor clinical response  - no need for repeat 2 D ECHO at this time  Chronic diastolic CHF - with now LE edema, weight up as noted above - place on lasix  - monitor closely  Hypokalemia, hypomagnesemia  - continue to  supplement as indicated, repeat BMP in AM - Mg supplemented and WNL t HTN (hypertension) - continue Clonidine and Metoprolol, but hold lisinopril as noted above  Bilateral deep vein thromboses - doppler done on admission with no evidence of DVT - Hg remains stable, will resume Coumadin  Anemia of chronic disease, DM, uncontrolled  - Hg 9.1 this AM - repeat CBC in AM COPD exacerbation/ seep apnea  - provide oxygen via [place]  - CPAP at night time  Morbid obesity, Body mass index is 43.6  Diabetes type 2, uncontrolled with complications of neuropathy - last A1C we have in the record is 6.9, 8 months ago - continue to hold Metformin due to renal failure - placed on SSI for now  - continue neurontin   DVT prophylaxis: resume Coumadin today  Code Status: Full.  Family Communication: plan of care discussed with the patient Disposition Plan: Home when pathology results back   IV access:  Peripheral IV  Procedures and diagnostic studies:   Ct Abdomen Pelvis W Contrast 04/24/2014 High-grade obstruction of the right kidney as discussed above. There is a tiny possible ureteral calculus just above the iliac crest level but I doubt this is causing the obstruction. High attenuation material in the collecting system and ureter could reflect blood. Could not exclude tumor. Recommend urology consultation and possible retrograde study. 2. Stable right adrenal gland lesion. 3. No other significant abdominal/pelvic findings.   Medical Consultants:  Urology   Other Consultants:  None   IAnti-Infectives:   None  Faye Ramsay, MD  [facility]  Pager [phone]  If 7PM-7AM, please contact night-coverage [email or web address] Password [facility] 04/27/2014, 3:36 PM   LOS: 3 days   HPI/Subjective: No events overnight.   Objective: Filed Vitals:   04/26/14 2115 04/27/14 0558 04/27/14 1201 04/27/14 1407  BP: 121/78 140/74  153/85  Pulse: 93 87  78  Temp: 98.6 F (37 C) 98.8  F (37.1 C)  98.7 F (37.1 C)  TempSrc: Oral Oral  Oral  Resp: 16 16  16   Height:      Weight:  131.271 kg (289 lb 6.4 oz)    SpO2: 97% 96% 97% 99%    Intake/Output Summary (Last 24 hours) at 04/27/14 1536 Last data filed at 04/27/14 1355  Gross per 24 hour  Intake 2567.67 ml  Output   3775 ml  Net -1207.33 ml    Exam:   General:  Pt is alert, follows commands appropriately, not in acute distress  Cardiovascular: Regular rate and rhythm, no rubs, no gallops  Respiratory: Clear to auscultation bilaterally, no wheezing, no crackles, no rhonchi  Abdomen: Soft, non tender, non distended, bowel sounds present, no guarding  Extremities: +2 bilateral pitting edema, pulses DP and PT palpable bilaterally  Neuro: Grossly nonfocal  Data Reviewed: Basic Metabolic Panel:  Recent Labs Lab 04/24/14 1701 04/24/14 2245 04/25/14 0430 04/26/14 0443 04/27/14 0013  NA 133*  --  131* 135 134*  K 2.9* 3.4* 3.4* 3.6 3.7  CL 96  --  95* 99 97  CO2 26  --  27 27 29   GLUCOSE 158*  --  148* 152* 153*  BUN 14  --  15 27* 22  CREATININE 1.21*  --  2.02* 2.57* 1.50*  CALCIUM 8.2*  --  7.4* 7.6* 7.6*  MG  --  1.2* 2.1  --   --    Liver Function Tests:  Recent Labs Lab 04/24/14 1701 04/25/14 0430  AST 24 21  ALT 20 20  ALKPHOS 92 80  BILITOT 0.7 1.1  PROT 8.2 7.3  ALBUMIN 4.2 3.7    Recent Labs Lab 04/24/14 1701  LIPASE 18   CBC:  Recent Labs Lab 04/24/14 1701 04/25/14 0430 04/26/14 0443 04/27/14 0013  WBC 10.3 7.6 5.7 6.6  NEUTROABS 7.9* 4.9  --   --   HGB 11.9* 10.1* 8.9* 9.1*  HCT 35.7* 31.9* 28.3* 28.8*  MCV 81.5 83.5 84.5 84.2  PLT 351 278 282 301   CBG:  Recent Labs Lab 04/26/14 0913 04/26/14 1214 04/26/14 1740 04/26/14 2117 04/27/14 0733  GLUCAP 117* 111* 158* 125* 120*    Recent Results (from the past 240 hour(s))  Culture, Urine     Status: None   Collection Time: 04/24/14  8:51 PM  Result Value Ref Range Status   Specimen Description  URINE, CATHETERIZED  Final   Special Requests NONE  Final   Colony Count NO GROWTH Performed at [facility]   Final   Culture NO GROWTH Performed at [facility]   Final   Report Status 04/26/2014 FINAL  Final  Surgical pcr screen     Status: None   Collection Time: 04/26/14  6:05 AM  Result Value Ref Range Status   MRSA, PCR NEGATIVE NEGATIVE Final   Staphylococcus aureus NEGATIVE NEGATIVE Final    Comment:        The Xpert SA Assay (FDA approved for NASAL specimens in patients over 21 years of age), is one component of a comprehensive surveillance program.  Test performance has been validated  by [facility] for patients greater than or equal to 59 year old. It is not intended to diagnose infection nor to guide or monitor treatment.      Scheduled Meds: . antiseptic oral rinse  7 mL Mouth Rinse BID  . aspirin EC  81 mg Oral Daily  . budesonide-formoterol  2 puff Inhalation BID  . cloNIDine  0.1 mg Oral BID  . DULoxetine  60 mg Oral Daily  . ferrous sulfate  325 mg Oral BID WC  . gabapentin  300 mg Oral TID  . insulin aspart  0-9 Units Subcutaneous TID WC  . metoprolol tartrate  12.5 mg Oral BID  . polyethylene glycol  17 g Oral BID  . pravastatin  40 mg Oral Daily  . QUEtiapine  50 mg Oral BID  . senna-docusate  1 tablet Oral BID   Continuous Infusions: . heparin 1,750 Units/hr (04/27/14 6153)  . lactated ringers 100 mL/hr at 04/27/14 0044

## 2014-04-27 NOTE — Progress Notes (Signed)
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 Corticosteroids Rash    States she developed a rash on her tongue after a steroid injection in her back    Patient Measurements: Height: 5\' 7"  (170.2 cm) Weight: 289 lb 6.4 oz (131.271 kg) IBW/kg (Calculated) : 61.6 Heparin Dosing Weight: 92 kg  Vital Signs: Temp: 98.7 F (37.1 C) (02/08 1407) Temp Source: Oral (02/08 1407) BP: 153/85 mmHg (02/08 1407) Pulse Rate: 78 (02/08 1407)  Labs:  Recent Labs  04/25/14 0430 04/25/14 1213 04/25/14 2230 04/26/14 0443 04/27/14 0012 04/27/14 0013 04/27/14 1024  HGB 10.1*  --   --  8.9*  --  9.1*  --   HCT 31.9*  --   --  28.3*  --  28.8*  --   PLT 278  --   --  282  --  301  --   APTT  --  60*  --   --   --   --   --   LABPROT 25.5* 22.2*  --  20.6*  --   --   --   INR 2.30* 1.92*  --  1.75*  --   --   --   HEPARINUNFRC  --   --  <0.10*  --  0.10*  --  0.35  CREATININE 2.02*  --   --  2.57*  --  1.50*  --     Estimated Creatinine Clearance: 57.8 mL/min (by C-G formula based on Cr of 1.5).  Assessment: 59yo F presented 2/5 with RLQ pain and hematuria. Found to have hydronephrosis with likely blood in the collecting system, possible small stone. On chronic Coumadin for hx Lupus anticoagulant and bilateral DVT. INR was 7.08 on admission. She was given Vit.K 5mg  IV x 1 and pharmacy was asked to start heparin to bridge to planned urologic surgery.  Significant events:  2/6: INR 1.92. Heparin started conservatively. First HL <0.1 on 1250units/hr. Increased to 1550units/hr. 2/7: Heparin held for cystoscopy with ureteral stent, resumed post-op 2/8: First HL in therapeutic range on 1750units/hr.    Today, 2/8:   HL 0.35 for the second time on 1750units/hr.  Resuming warfarin. INR 1.75 yesterday.   H/H a little lower. Pltc  ok. No bleeding reported/documented.  Renal function improving  SCDs on  Also on ASA 81mg  daily  [place] of Therapy:  Heparin level 0.3-0.7 units/ml  INR 2-3 Monitor platelets by anticoagulation protocol: Yes   Plan:  Continue heparin at 1750 units/hr ( = 17.87ml/hr). Give warfarin 10mg  x 1 at 1800. Daily HL, INR, CBC.  Romeo Rabon, PharmD, pager [phone]. 04/27/2014,4:58 PM.

## 2014-04-27 NOTE — Progress Notes (Signed)
Pt refuse to sleep with cpap on said it is uncomfortable. Rn educated pt about the benefits and risk of not using it. Pt will like to sleep with o2 via [place].

## 2014-04-27 NOTE — Progress Notes (Signed)
RT placed pt on auto titrate CPAP (5-20cmH2O) with 2L of oxygen bled in via nasal mask. Sterile water was added for humidification. Pt states she is comfortable and is tolerating CPAP well at this time. RT will continue to monitor as needed.

## 2014-04-27 NOTE — Progress Notes (Signed)
1 Day Post-Op  Subjective:  1 - Right hydronephrosis / Flank Pain - acute on chronic right flank pain and new moderate right hydro with high density material (likely blood) in collecting system by contrast CT 04/24/2014. This is new since recent prior study 01/2014. Questionable tiny punctate stone, though this may be in gonadal vein as appear similar to prior studies. NO stone clearly visualized at ureteroscopy 04/26/2014.  Biopsies of Rt kidney pending.   2 - Right Adrenal Adenoma - 3 cm right adrenal mass stable by imaging since 2013. No h/o refractory hypertension.  3 - , Gross Hematuria - new gross hematuria 04/2014. Prior smoker. She has h/o DVT / Lupus anticoagulant now on chronic coumadin.  4 - Acute Renal Failure - Cr 2.02 04/25/14 up from baseline <1. Right hydro as per above, admits to poor PO intake as well, now trending down after ureteral stenting.   PMH sig for DVT/PE, COPD, obesity, DM2, chole. Her PCP is Elroy Channel MD.  Today Yarden is stable. Having some hematuria as expected but no clots / retention.   Objective: Vital signs in last 24 hours: Temp:  [98.6 F (37 C)-98.8 F (37.1 C)] 98.7 F (37.1 C) (02/08 1407) Pulse Rate:  [78-93] 78 (02/08 1407) Resp:  [16] 16 (02/08 1407) BP: (121-153)/(74-85) 153/85 mmHg (02/08 1407) SpO2:  [96 %-99 %] 99 % (02/08 1407) Weight:  [131.271 kg (289 lb 6.4 oz)] 131.271 kg (289 lb 6.4 oz) (02/08 0558) Last BM Date: 04/23/14  Intake/Output from previous day: 02/07 0701 - 02/08 0700 In: 3826 [P.O.:840; I.V.:2986] Out: 7169 [Urine:2475] Intake/Output this shift: Total I/O In: 240 [P.O.:240] Out: 1300 [Urine:1300]  General appearance: alert, cooperative and appears stated age Eyes: negative Nose: Nares normal. Septum midline. Mucosa normal. No drainage or sinus tenderness. Throat: lips, mucosa, and tongue normal; teeth and gums normal Neck: supple, symmetrical, trachea midline Back: symmetric, no curvature. ROM normal. No CVA  tenderness. Resp: non-labored Cardio: Nl rate GI: soft, non-tender; bowel sounds normal; no masses,  no organomegaly Extremities: extremities normal, atraumatic, no cyanosis or edema Pulses: 2+ and symmetric Skin: Skin color, texture, turgor normal. No rashes or lesions Lymph nodes: Cervical, supraclavicular, and axillary nodes normal. Neurologic: Grossly normal  Lab Results:   Recent Labs  04/26/14 0443 04/27/14 0013  WBC 5.7 6.6  HGB 8.9* 9.1*  HCT 28.3* 28.8*  PLT 282 301   BMET  Recent Labs  04/26/14 0443 04/27/14 0013  NA 135 134*  K 3.6 3.7  CL 99 97  CO2 27 29  GLUCOSE 152* 153*  BUN 27* 22  CREATININE 2.57* 1.50*  CALCIUM 7.6* 7.6*   PT/INR  Recent Labs  04/25/14 1213 04/26/14 0443  LABPROT 22.2* 20.6*  INR 1.92* 1.75*   ABG No results for input(s): PHART, HCO3 in the last 72 hours.  Invalid input(s): PCO2, PO2  Studies/Results: No results found.  Anti-infectives: Anti-infectives    Start     Dose/Rate Route Frequency Ordered Stop   04/26/14 0630  ceFAZolin (ANCEF) powder 1 g  Status:  Discontinued     1 g Other To Surgery 04/26/14 0618 04/26/14 0621   04/26/14 0630  ceFAZolin (ANCEF) IVPB 2 g/50 mL premix     2 g100 mL/hr over 30 Minutes Intravenous  Once 04/26/14 0622 04/26/14 0736      Assessment/Plan:  1 - Right hydronephrosis / Flank Pain - unclear etiology by imaging alone. DDX spontanous bleed / clot in setting supratheraputic IVR v. Urothelial malignancy  v. Tiny stone. No stricture / stones seen at ureteroscopy, biopsy of inflamed tissue pending (to r/o malignancy).   2 - Right Adrenal Adenoma -likely benign adenoma by size and stability. Rec random cortisol and random serum metanephrines to help r/o functional lesion, obtained and pending.   3 - Gross Hematuria - biopsy pending to rule out upper tract cancer. Explained she will likely have some persistent gross hematuria on/off with stent in as long as anticoagulated, but that as  long as not large clots, this is not worriseom.   4 - Acute Renal Failure - likely multifactorial with obstruction (rt hydro) and pre-renal (poor recent PO intake). Improving s/p renal decompression.   [facility], Gautam Langhorst 04/27/2014

## 2014-04-28 ENCOUNTER — Other Ambulatory Visit: Payer: Medicare Other

## 2014-04-28 ENCOUNTER — Ambulatory Visit: Payer: Medicare Other

## 2014-04-28 LAB — CBC
HCT: 26.8 % — ABNORMAL LOW (ref 36.0–46.0)
Hemoglobin: 8.4 g/dL — ABNORMAL LOW (ref 12.0–15.0)
MCH: 26.5 pg (ref 26.0–34.0)
MCHC: 31.3 g/dL (ref 30.0–36.0)
MCV: 84.5 fL (ref 78.0–100.0)
Platelets: 300 10*3/uL (ref 150–400)
RBC: 3.17 MIL/uL — ABNORMAL LOW (ref 3.87–5.11)
RDW: 17.3 % — ABNORMAL HIGH (ref 11.5–15.5)
WBC: 5.3 10*3/uL (ref 4.0–10.5)

## 2014-04-28 LAB — GLUCOSE, CAPILLARY
Glucose-Capillary: 126 mg/dL — ABNORMAL HIGH (ref 70–99)
Glucose-Capillary: 113 mg/dL — ABNORMAL HIGH (ref 70–99)
Glucose-Capillary: 126 mg/dL — ABNORMAL HIGH (ref 70–99)
Glucose-Capillary: 109 mg/dL — ABNORMAL HIGH (ref 70–99)
Glucose-Capillary: 106 mg/dL — ABNORMAL HIGH (ref 70–99)
Glucose-Capillary: 147 mg/dL — ABNORMAL HIGH (ref 70–99)
Glucose-Capillary: 118 mg/dL — ABNORMAL HIGH (ref 70–99)

## 2014-04-28 LAB — METANEPHRINES, PLASMA
Metanephrine, Free: <25 pg/mL (ref <=57)
Normetanephrine, Free: 324 pg/mL — ABNORMAL HIGH (ref <=148)
Total Metanephrines-Plasma: 324 pg/mL — ABNORMAL HIGH (ref <=205)

## 2014-04-28 LAB — BASIC METABOLIC PANEL
Anion gap: 8 (ref 5–15)
BUN: 15 mg/dL (ref 6–23)
CO2: 30 mmol/L (ref 19–32)
Calcium: 8.1 mg/dL — ABNORMAL LOW (ref 8.4–10.5)
Chloride: 100 mmol/L (ref 96–112)
Creatinine, Ser: 0.86 mg/dL (ref 0.50–1.10)
GFR calc Af Amer: 85 mL/min — ABNORMAL LOW (ref >90)
GFR calc non Af Amer: 73 mL/min — ABNORMAL LOW (ref >90)
Glucose, Bld: 131 mg/dL — ABNORMAL HIGH (ref 70–99)
Potassium: 3.9 mmol/L (ref 3.5–5.1)
Sodium: 138 mmol/L (ref 135–145)

## 2014-04-28 LAB — HEPARIN LEVEL (UNFRACTIONATED): Heparin Unfractionated: 0.46 IU/mL (ref 0.30–0.70)

## 2014-04-28 LAB — PROTIME-INR
INR: 1.41 (ref 0.00–1.49)
Prothrombin Time: 17.4 seconds — ABNORMAL HIGH (ref 11.6–15.2)

## 2014-04-28 IMAGING — CR DG HIP COMPLETE 2+V*R*
3 series · 3 of 3 positions shown · non-contrast
Comparison: CT pelvis from earlier the same day.

CLINICAL DATA: 56-year-old female with right hip pain.  No known
trauma.

RIGHT HIP - COMPLETE 2+ VIEW

[t pelvis a.p. *]
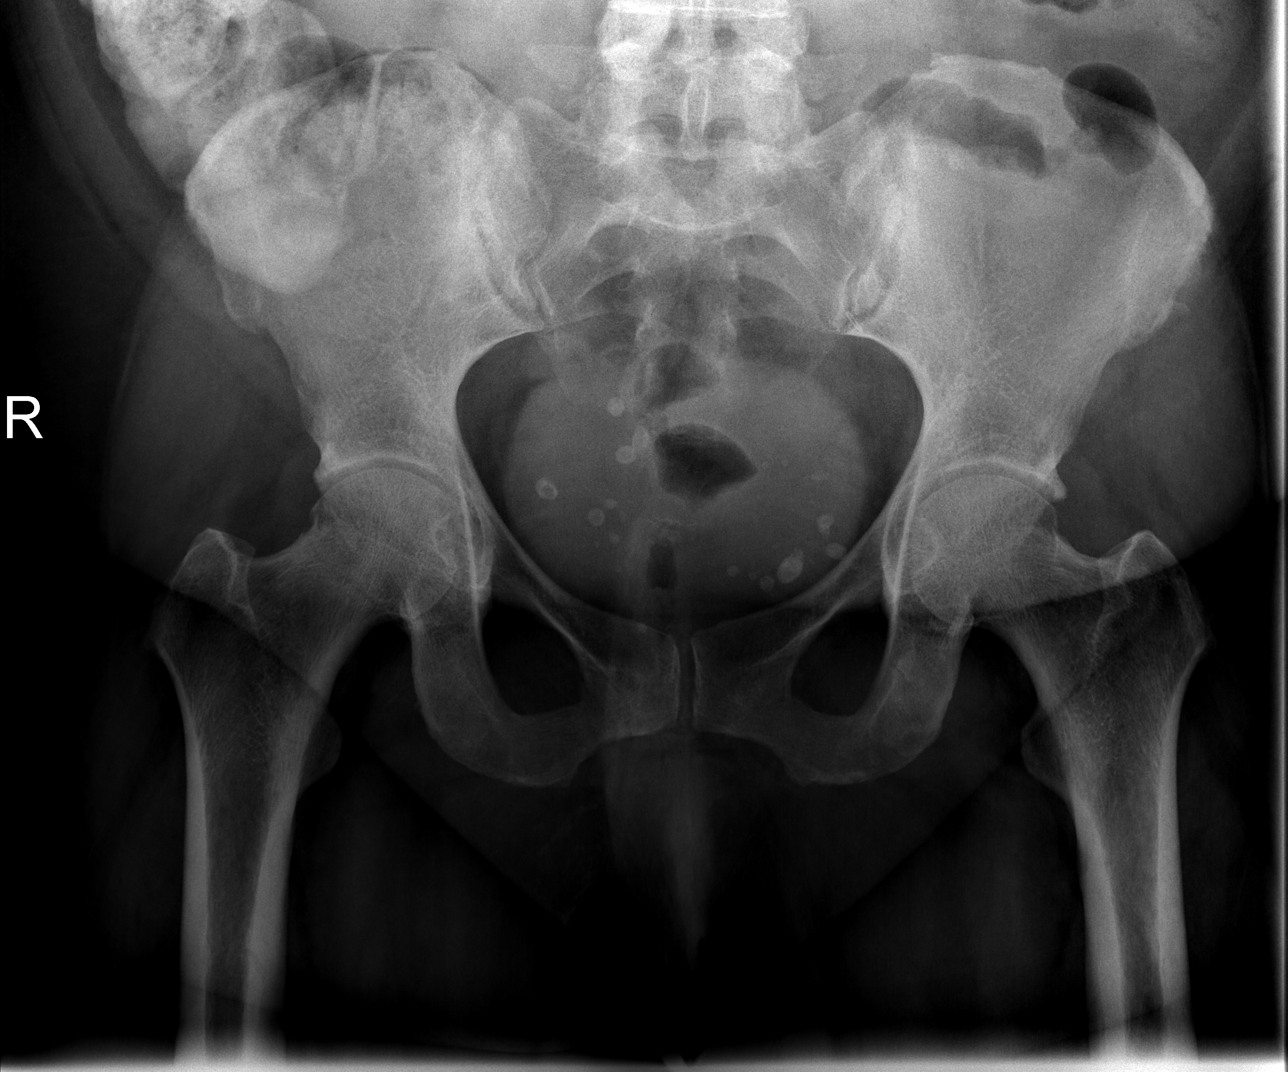

[t hip ap right]
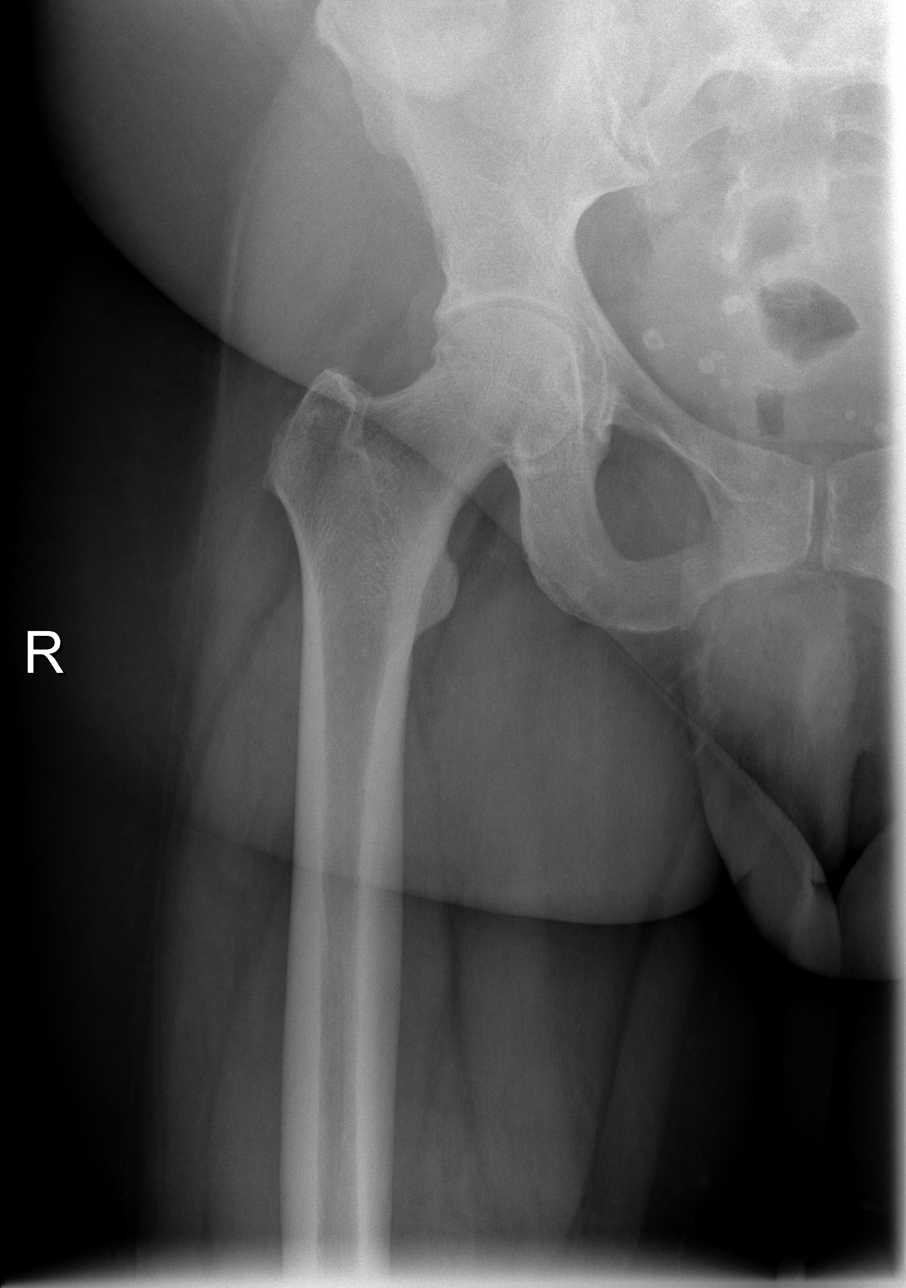

[t hip frog leg right]
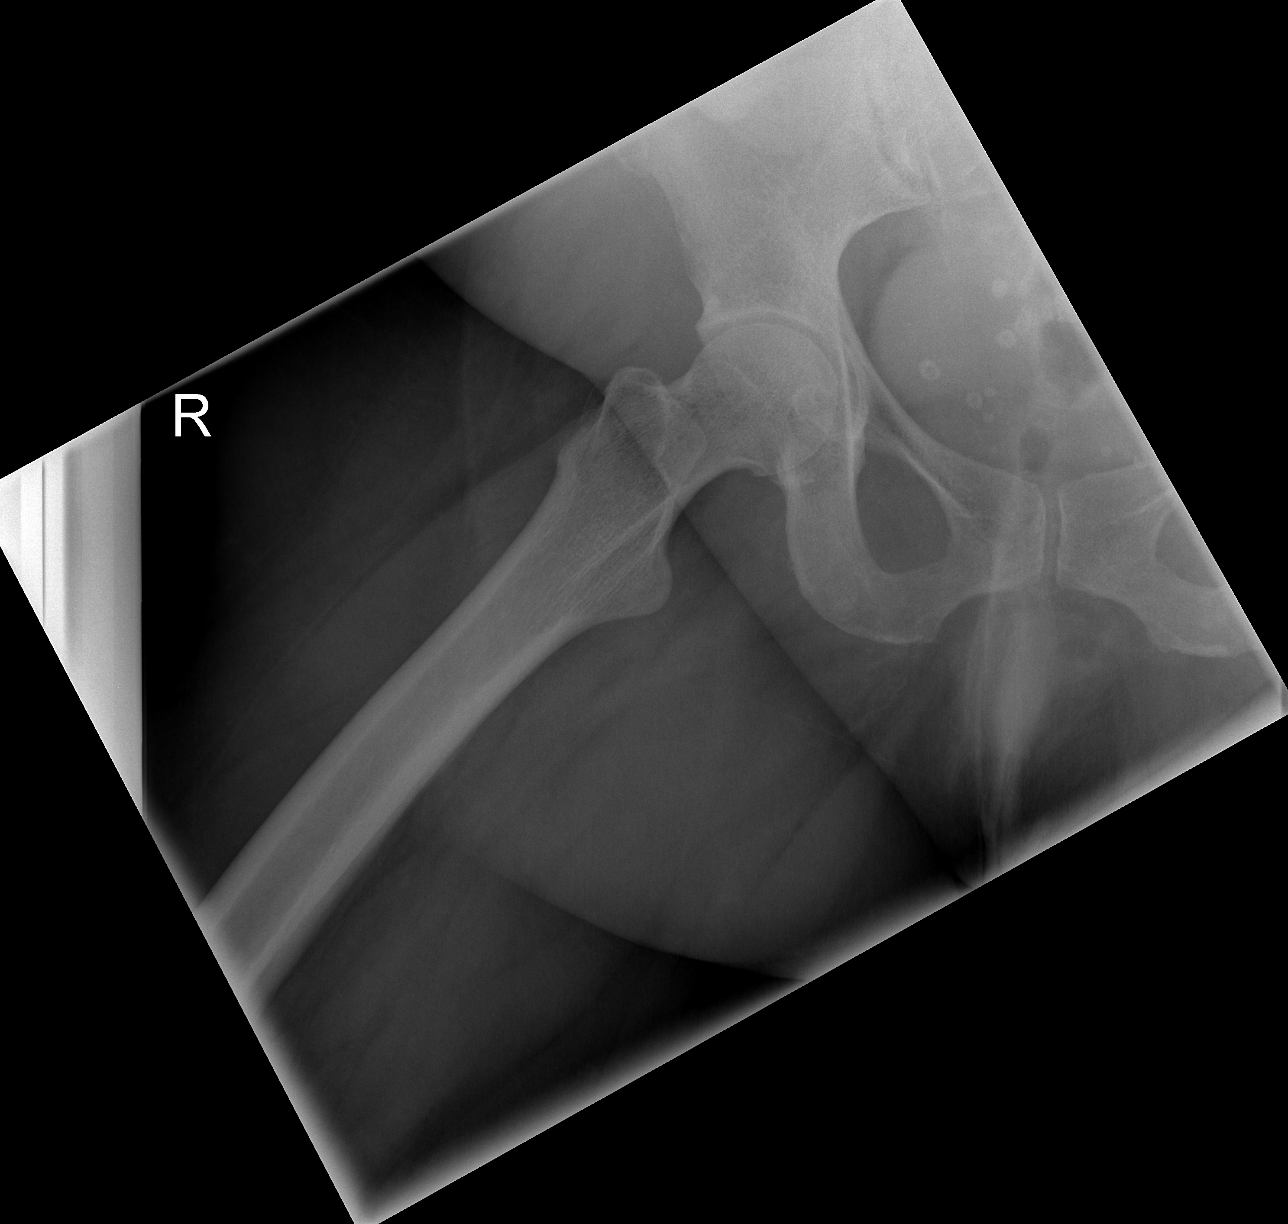

[3 of 3 positions shown; findings below may reference images not displayed]

FINDINGS: Femoral heads normally located.  Joint spaces are
preserved.

On the AP view of the pelvis there is subtle suggestion of
disruption of the trabecula at the level of the proximal right
femoral neck.  This finding is not correlated on the dedicated AP
and frog-leg lateral right proximal femur views.

The pelvis appears intact.  Proximal left femur appears intact.
Pelvic phleboliths.  Sacral ala and SI joints appear within normal
limits.
IMPRESSION: Subtle lucency through the right femoral neck on only the AP view
of the pelvis.  When correlated with the earlier CT pelvis from
today, I favor this is artifact.
I would recommend conservative treatment at this time, and if hip
pain increases or does not improve, a follow-up right hip MRI to
exclude insufficiency fracture.

## 2014-04-28 IMAGING — MR MR LUMBAR SPINE W/O CM
4 of 5 series · 19 of 48 positions shown · non-contrast
Comparison: 09/19/2011 and earlier.

CLINICAL DATA: 56-year-old female with low back pain radiating to
the right groin.

MRI LUMBAR SPINE WITHOUT CONTRAST
TECHNIQUE: Multiplanar and multiecho pulse sequences of the lumbar
spine were obtained without intravenous contrast.

[Series 3: T2 · sagittal · 4.0mm · 0.55mm/px · 6 of 12 slices shown (1 of 2)]
[im 1/12]
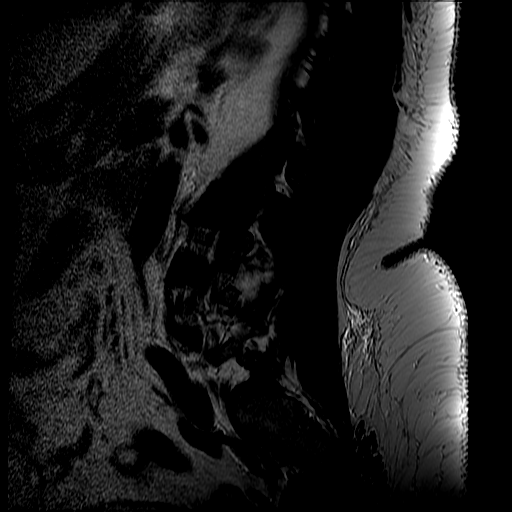
[im 3/12]
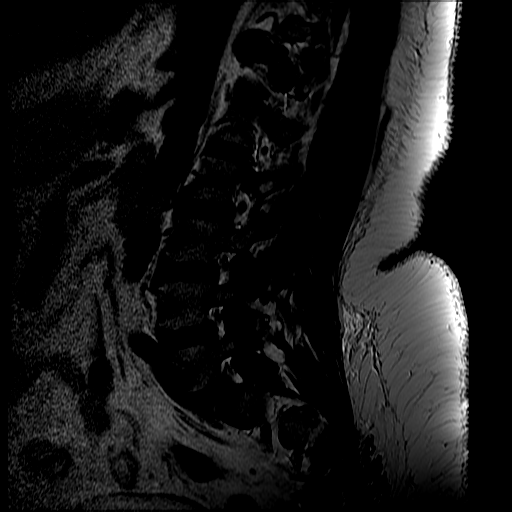
[im 5/12]
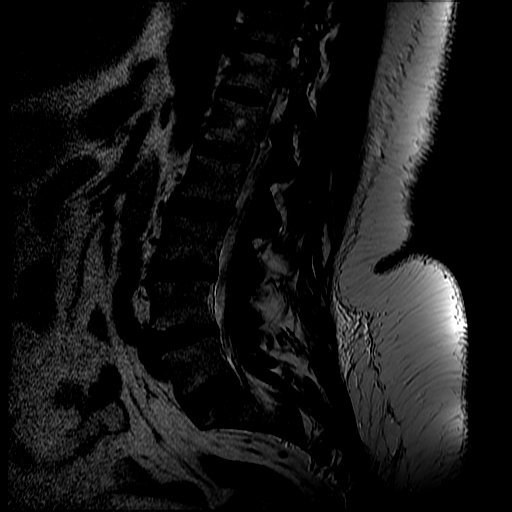
[im 7/12]
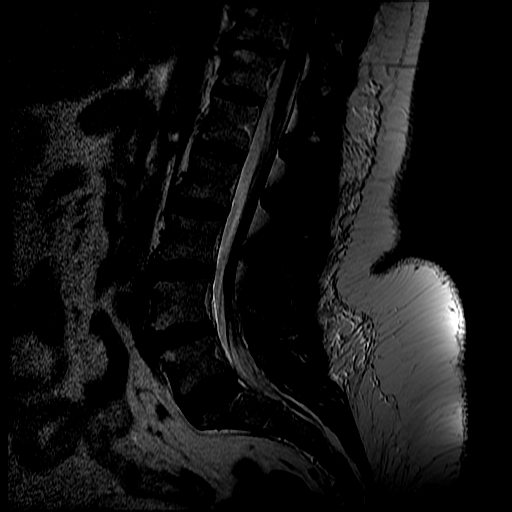
[im 9/12]
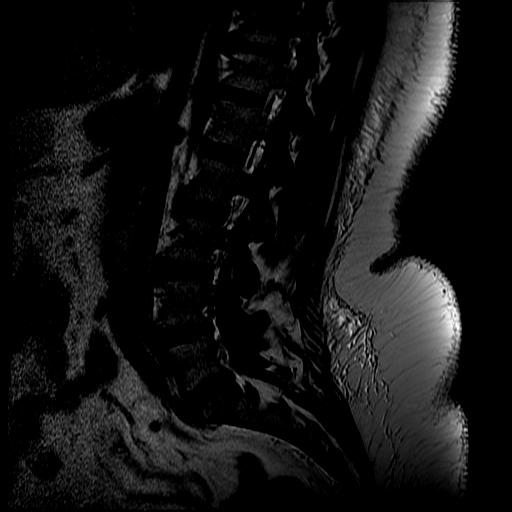
[im 12/12]
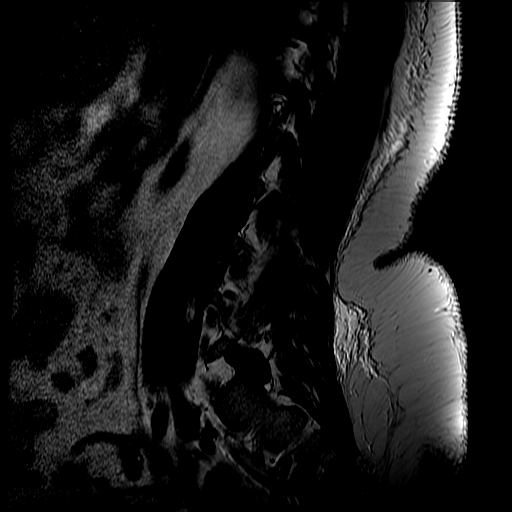

[Series 4: T1 · sagittal · 4.0mm · 0.55mm/px · 3 of 12 slices shown (1 of 2)]
[im 3/12]
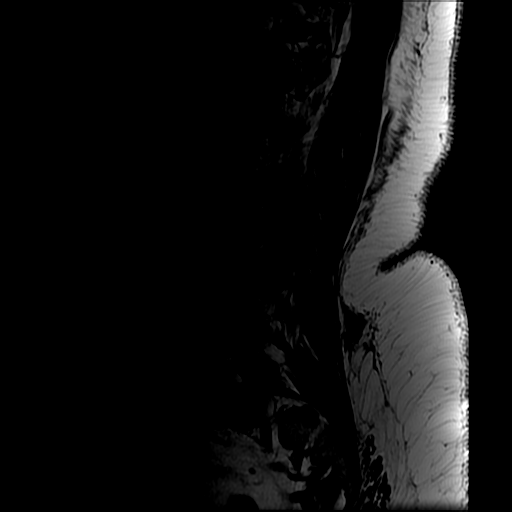
[im 7/12]
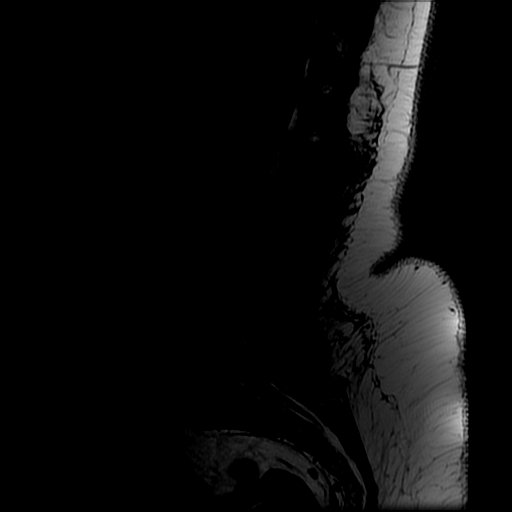
[im 12/12]
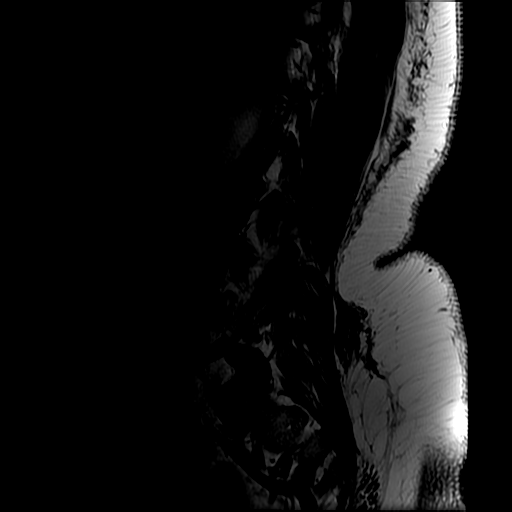

[Series 6: T2 · axial · 5.0mm · 0.39mm/px · z∈[-39,+153]mm · 7 of 32 slices shown (2 of 2)]
[im 1/32]
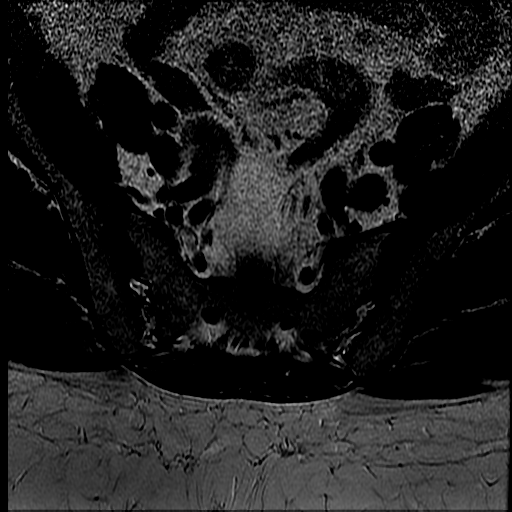
[im 5/32]
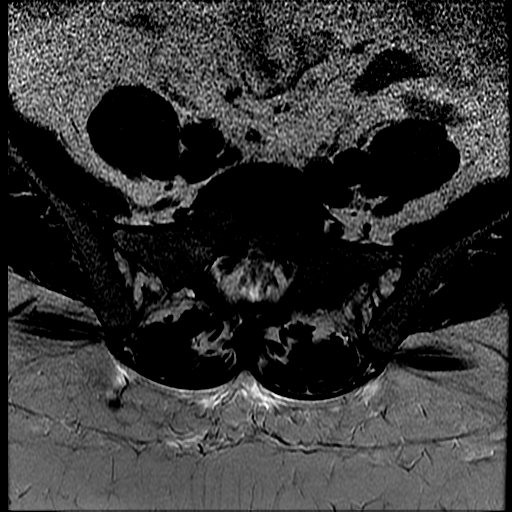
[im 9/32]
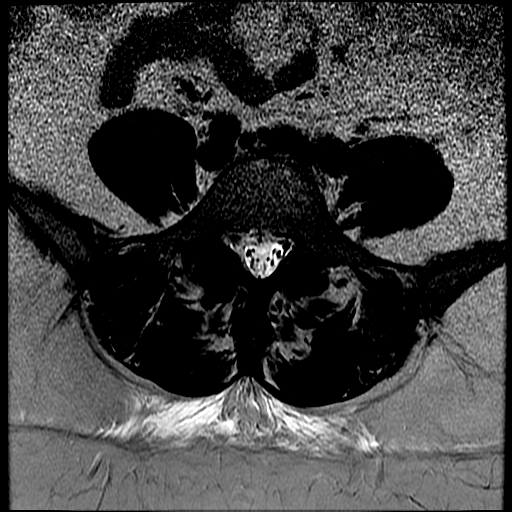
[im 14/32]
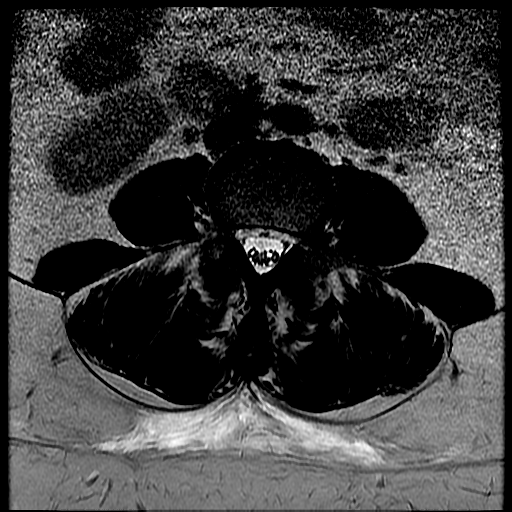
[im 16/32]
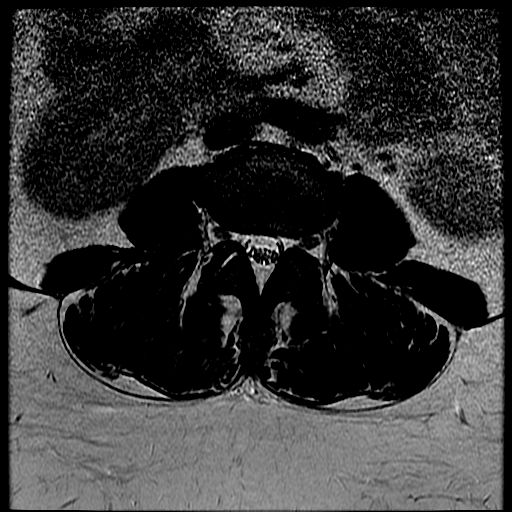
[im 18/32]
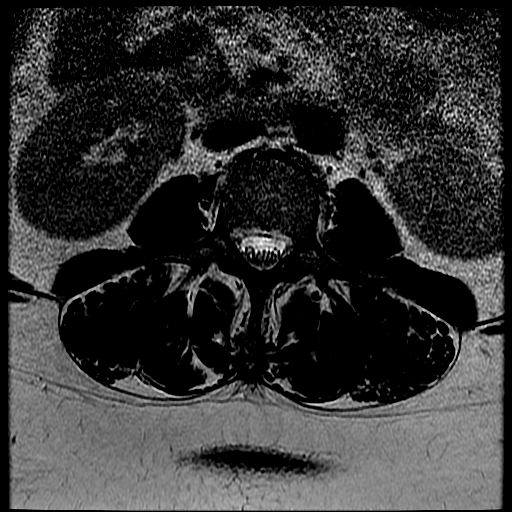
[im 27/32]
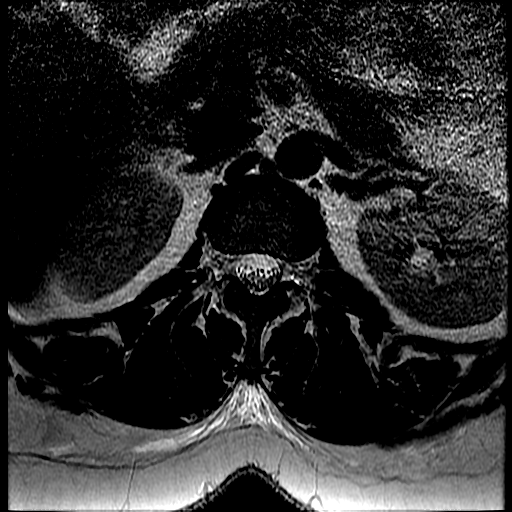

[Series 7: T1 · axial · 5.0mm · 0.39mm/px · z∈[-15,+153]mm · 3 of 32 slices shown (2 of 2)]
[im 5/32]
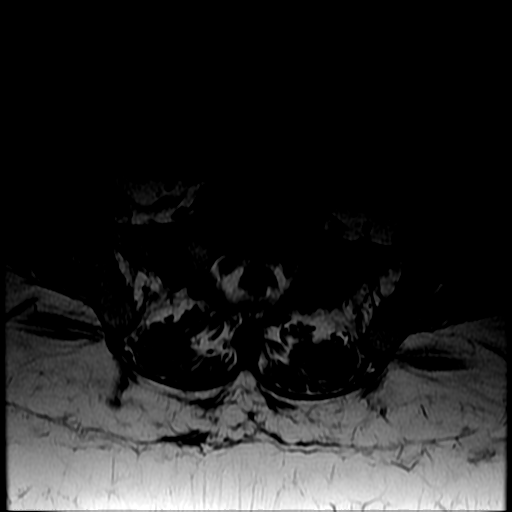
[im 16/32]
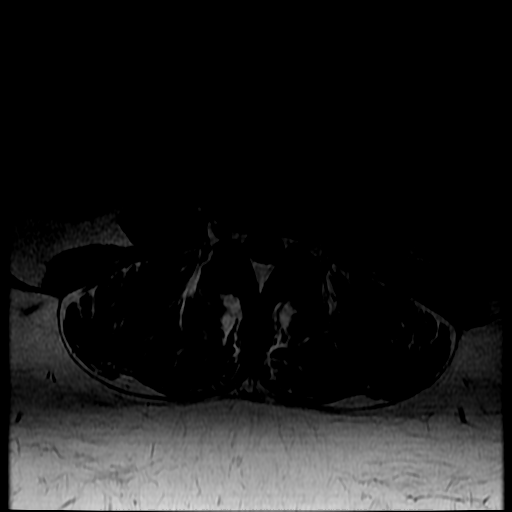
[im 27/32]
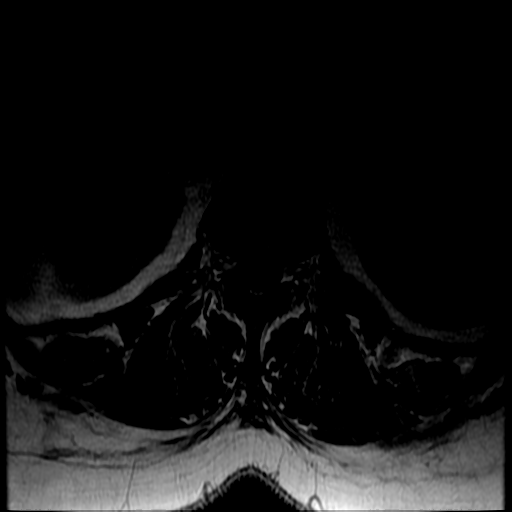

[19 of 48 positions shown; findings below may reference images not displayed]

FINDINGS: Stable vertebral height and alignment. No marrow edema or
evidence of acute osseous abnormality.

 Visualized lower thoracic spinal cord is normal with conus
medularis at L1.

Visualized paraspinal soft tissues are within normal limits.
Stable visualized abdominal viscera, including chronic right
adrenal adenoma stable since 8003.

T10-T11:  Chronic posterior element hypertrophy.  No significant
stenosis.

T11-T12:  Chronic anterior eccentric disc bulge and posterior
element hypertrophy.  No significant stenosis.

T12-L1:  Negative.

L1-L2:  Negative.

L2-L3:  Mild disc desiccation and disc bulge.  No stenosis.

L3-L4:  Mild disc desiccation and disc bulge.  Stable mild to
moderate facet hypertrophy.  No stenosis.

L4-L5:  Stable disc desiccation and disc bulge.  Moderate to severe
facet hypertrophy, greater on the right, is stable.  Stable minimal
to mild right L4 foraminal stenosis.  No spinal or lateral recess
stenosis.

L5-S1:  Minor disc desiccation.  Stable moderate facet hypertrophy.
No stenosis.
IMPRESSION: Stable lumbar spine with no significant disc disease, and no spinal
stenosis or convincing neural impingement.  There is chronic severe
facet degeneration at L4-L5, greater on the right.

## 2014-04-28 MED ORDER — AMLODIPINE BESYLATE 5 MG PO TABS
5.0000 mg | ORAL_TABLET | Freq: Every day | ORAL | Status: DC
  Administered 2014-04-28 – 2014-04-30 (×3): 5 mg via ORAL
  Filled 2014-04-28 (×3): qty 1

## 2014-04-28 MED ORDER — LISINOPRIL 5 MG PO TABS
5.0000 mg | ORAL_TABLET | Freq: Every day | ORAL | Status: DC
  Administered 2014-04-28 – 2014-04-30 (×3): 5 mg via ORAL
  Filled 2014-04-28 (×3): qty 1

## 2014-04-28 MED ORDER — DILTIAZEM HCL ER COATED BEADS 180 MG PO CP24: 180.0000 mg | ORAL_CAPSULE | Freq: Every day | ORAL | Status: DC

## 2014-04-28 MED ORDER — WARFARIN SODIUM 10 MG PO TABS
10.0000 mg | ORAL_TABLET | Freq: Once | ORAL | Status: DC
  Filled 2014-04-28: qty 1

## 2014-04-28 MED ORDER — FUROSEMIDE 20 MG PO TABS
20.0000 mg | ORAL_TABLET | Freq: Two times a day (BID) | ORAL | Status: DC
  Administered 2014-04-28 – 2014-04-30 (×4): 20 mg via ORAL
  Filled 2014-04-28 (×6): qty 1

## 2014-04-28 MED ORDER — DILTIAZEM HCL ER COATED BEADS 180 MG PO CP24
180.0000 mg | ORAL_CAPSULE | Freq: Every day | ORAL | Status: DC
  Administered 2014-04-28 – 2014-04-30 (×3): 180 mg via ORAL
  Filled 2014-04-28 (×3): qty 1

## 2014-04-28 NOTE — Progress Notes (Signed)
Patient ID: [name], female   DOB: Jul 22, 1955, 59 y.o.   MRN: [id number]  [facility] PROGRESS NOTE  [name] HWE:[id number] DOB: [birth year] DOA: 04/24/2014 PCP: Philis Fendt, MD   Brief narrative:    59 y.o. 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   Assessment/Plan:    Principal Problem: Right hydronephrosis / Flank Pain - s/p CYSTOSCOPY/ RIGHT RETROGRADE/ RIGHT URETEROSCOPY/URETERAL AND RENAL PELVIS BIOPSY 04/26/2014 - pt reports feeling better, pathology report with some atypia and hemorrhage but no evidence of malignancy - main question for now is weather or not to continue [facility] with Coumadin as pt is still reporting bleeding  - I have sent message via EPIC system to Dr Benay Spice to assist with this issue as pt prefers to get his opinion   Right Adrenal Adenoma  - 3 cm right adrenal mass stable by imaging since 2013 - cortisol level stable but metanephrine levels elevated - will need to discuss with urologist on further plan of management of this adrenal mass  - continue home BP regimen with Clonidine, resume Norvasc, Lisinopril, Cardizem (2/9) Gross Hematuria  - new gross hematuria 04/2014. Prior smoker - pt still reports bleeding, coumadin was restarted 2/8 and I have send request to Dr. Benay Spice to discuss with pt options on [facility] given persistent bleeding and now drop in Hg over the past 24 hours  Acute Renal Failure/ Hyponatremia  - stopped metformin and lisinopril on admission as both are contraindicated with ARF - Cr is now WNL this AM and will resume Lisinopril today 2/9  -  repeat BMP in AM Bilateral LE swelling  - this could be related to diastolic CHF - pt has known history of chronic diastolic CHF per last 2 D ECHO in 2015 - EF 55 % at that time but grade II diastolic dysfunction  - weight trend since admission: 126 --> 130 --> 131 --> 130 kg this AM - placed on Lasix and will continue and monitor clinical response, continue Lasix 20 mg PO BID  - no need for repeat 2 D ECHO at this time  Chronic diastolic CHF - with now LE edema improved, weight trending noted above - continue Lasix 20 mg PO BID  - monitor renal function closely  Hypokalemia, hypomagnesemia  - continue to supplement as indicated, repeat BMP in AM - Mg supplemented and WNL 2/6 HTN (hypertension) - continue Clonidine, Lasix  - since SBP in 160, will resume home medical regimen with Cardizem 180 XR QD, Norvasc 5 mg QD - also add Lisinopril and Cr is now WNL  Bilateral deep vein thromboses - doppler done on admission with no evidence of DVT - Hg dropped from 9.1 to 8.4 this AM after Coumadin has been resumed 2/8, INR is 1.41 this AM - to discuss with Dr. Benay Spice about continuation of [facility], ? If able to hold and monitor pt off AC  Anemia of chronic disease, DM, uncontrolled  - Hg drop overnight and pt reports blood in urine and stool  - repeat CBC in AM, no indication for transfusion at this time  COPD exacerbation/ seep apnea  - provide oxygen via [place]  - CPAP at  night time  Morbid obesity, Body mass index is 43.6  Diabetes type 2, uncontrolled with complications of neuropathy - last A1C we have in the record is 6.9, 8 months ago - held Metformin due to renal failure (Cr 2.57 on admission)  - placed on SSI  - continue neurontin  - Cr is now WNL so may resume Metformin upon discharge   DVT prophylaxis: stop Coumadin and Heparin 2/9 due to bleeding and place temporarily on SCD's until issue addressed with Dr. Benay Spice   Code Status: Full.  Family Communication: plan of care  discussed with the patient Disposition Plan: Home when no further bleeding question of continuing AC resolved, to discuss with Dr. Benay Spice per pt's preference and message sent to him today via EPIC order set   IV access:  Peripheral IV  Procedures and diagnostic studies:   Ct Abdomen Pelvis W Contrast 04/24/2014 High-grade obstruction of the right kidney as discussed above. There is a tiny possible ureteral calculus just above the iliac crest level, doubt this is causing the obstruction. High attenuation material in the collecting system and ureter could reflect blood. Could not exclude tumor. Recommend urology consultation and possible retrograde study. 2. Stable right adrenal gland lesion. 3. No other significant abdominal/pelvic findings.   Medical Consultants:  Urology   Other Consultants:  None   IAnti-Infectives:   None   Faye Ramsay, MD  [facility] Pager [phone]  If 7PM-7AM, please contact night-coverage [email or web address] Password [facility] 04/28/2014, 4:38 PM   LOS: 4 days   HPI/Subjective: No events overnight.   Objective: Filed Vitals:   04/27/14 2215 04/28/14 0449 04/28/14 0744 04/28/14 1337  BP:  130/59  160/90  Pulse: 84 78  77  Temp:  98.3 F (36.8 C)  97.8 F (36.6 C)  TempSrc:  Oral  Oral  Resp: 18 18  16   Height:      Weight:  130.953 kg (288 lb 11.2 oz)    SpO2: 99% 100% 97% 94%    Intake/Output Summary (Last 24 hours) at 04/28/14 1638 Last data filed at 04/28/14 1505  Gross per 24 hour  Intake    720 ml  Output   1900 ml  Net  -1180 ml    Exam:   General:  Pt is alert, follows commands appropriately, not in acute distress  Cardiovascular: Regular rate and rhythm, S1/S2, no murmurs, no rubs, no gallops  Respiratory: Clear to auscultation bilaterally, no wheezing, no crackles, no rhonchi  Abdomen: Soft, non tender, non distended, bowel sounds present, no guarding  Extremities: + 1 bilateral LE pitting edema,  pulses DP and PT palpable bilaterally  Neuro: Grossly nonfocal  Data Reviewed: Basic Metabolic Panel:  Recent Labs Lab 04/24/14 1701 04/24/14 2245 04/25/14 0430 04/26/14 0443 04/27/14 0013 04/28/14 0416  NA 133*  --  131* 135 134* 138  K 2.9* 3.4* 3.4* 3.6 3.7 3.9  CL 96  --  95* 99 97 100  CO2 26  --  27 27 29 30   GLUCOSE 158*  --  148* 152* 153* 131*  BUN 14  --  15 27* 22 15  CREATININE 1.21*  --  2.02* 2.57* 1.50* 0.86  CALCIUM 8.2*  --  7.4* 7.6* 7.6* 8.1*  MG  --  1.2* 2.1  --   --   --    Liver Function Tests:  Recent Labs Lab 04/24/14 1701 04/25/14 0430  AST 24 21  ALT 20 20  ALKPHOS 92 80  BILITOT 0.7 1.1  PROT 8.2 7.3  ALBUMIN 4.2 3.7    Recent Labs Lab 04/24/14 1701  LIPASE 18   CBC:  Recent Labs Lab 04/24/14 1701 04/25/14 0430 04/26/14 0443 04/27/14 0013 04/28/14 0416  WBC 10.3 7.6 5.7 6.6 5.3  NEUTROABS 7.9* 4.9  --   --   --   HGB 11.9* 10.1* 8.9* 9.1* 8.4*  HCT 35.7* 31.9* 28.3* 28.8* 26.8*  MCV 81.5 83.5 84.5 84.2 84.5  PLT 351 278 282 301 300   CBG:  Recent Labs Lab 04/27/14 1146 04/27/14 1701 04/27/14 2125 04/28/14 0734 04/28/14 1137  GLUCAP 126* 113* 126* 109* 106*    Recent Results (from the past 240 hour(s))  Culture, Urine     Status: None   Collection Time: 04/24/14  8:51 PM  Result Value Ref Range Status   Specimen Description URINE, CATHETERIZED  Final   Special Requests NONE  Final   Colony Count NO GROWTH Performed at [facility]   Final   Culture NO GROWTH Performed at [facility]   Final   Report Status 04/26/2014 FINAL  Final  Surgical pcr screen     Status: None   Collection Time: 04/26/14  6:05 AM  Result Value Ref Range Status   MRSA, PCR NEGATIVE NEGATIVE Final   Staphylococcus aureus NEGATIVE NEGATIVE Final    Comment:        The Xpert SA Assay (FDA approved for NASAL specimens in patients over 57 years of age), is one component of a comprehensive surveillance program.   Test performance has been validated by [facility] for patients greater than or equal to 70 year old. It is not intended to diagnose infection nor to guide or monitor treatment.      Scheduled Meds: . antiseptic oral rinse  7 mL Mouth Rinse BID  . aspirin EC  81 mg Oral Daily  . budesonide-formoterol  2 puff Inhalation BID  . cloNIDine  0.1 mg Oral BID  . DULoxetine  60 mg Oral Daily  . ferrous sulfate  325 mg Oral BID WC  . gabapentin  300 mg Oral TID  . insulin aspart  0-9 Units Subcutaneous TID WC  . metoprolol tartrate  12.5 mg Oral BID  . polyethylene glycol  17 g Oral BID  . pravastatin  40 mg Oral Daily  . QUEtiapine  50 mg Oral BID  . senna-docusate  1 tablet Oral BID  . warfarin  10 mg Oral ONCE-1800  . Warfarin - Pharmacist Dosing Inpatient   Does not apply q1800   Continuous Infusions: . heparin 1,750 Units/hr (04/28/14 1218)  . lactated ringers 100 mL/hr at 04/28/14 1624

## 2014-04-28 NOTE — Evaluation (Signed)
Physical Therapy Evaluation Patient Details Name: [name] MRN: [id number] DOB: [birth year] Today's Date: 04/28/2014   History of Present Illness  S/P cystoscopy, R ureter stent, R adrenal adenocarcinoma.   on 04/26/14.  Clinical Impression  Pt tolerated well in spite of pain. Pt will benefit from PT to address problems listed below.   Follow Up Recommendations Home health PT;Supervision - Intermittent    Equipment Recommendations  None recommended by PT    Recommendations for Other Services       Precautions / Restrictions Precautions Precautions: Fall Precaution Comments: reports knees buckle and she falls, S/P Bil TKA.      Mobility  Bed Mobility Overal bed mobility: Modified Independent             General bed mobility comments: cues for  safety and  how to protect abdomen.  Transfers Overall transfer level: Needs assistance Equipment used: Rolling walker (2 wheeled) Transfers: Sit to/from Stand Sit to Stand: Min guard         General transfer comment: cues for safety  Ambulation/Gait Ambulation/Gait assistance: Min assist Ambulation Distance (Feet): 80 Feet Assistive device: Rolling walker (2 wheeled) Gait Pattern/deviations: WFL(Within Functional Limits) Gait velocity: slow      Stairs            Wheelchair Mobility    Modified Rankin (Stroke Patients Only)       Balance Overall balance assessment: History of Falls                                           Pertinent Vitals/Pain Pain Assessment: 0-10 Pain Score: 8  Pain Descriptors / Indicators: Cramping Pain Intervention(s): Monitored during session;Premedicated before session;Repositioned    Home Living Family/patient expects to be discharged to:: Private residence Living Arrangements: Alone Available Help at Discharge: Personal care attendant Type of Home: [place]: One level Home Equipment: Environmental consultant - 4 wheels;Shower seat Additional  Comments: pt wants a scooter    Prior Function Level of Independence: Needs assistance      ADL's / Homemaking Assistance Needed: has  personal care 7 days a week.        Hand Dominance        Extremity/Trunk Assessment   Upper Extremity Assessment: Generalized weakness           Lower Extremity Assessment: Generalized weakness         Communication   Communication: No difficulties  Cognition Arousal/Alertness: Awake/alert Behavior During Therapy: WFL for tasks assessed/performed Overall Cognitive Status: Within Functional Limits for tasks assessed                      General Comments      Exercises        Assessment/Plan    PT Assessment    PT Diagnosis Difficulty walking   PT Problem List    PT Treatment Interventions     PT Goals (Current goals can be found in the Care Plan section) Acute Rehab PT Goals Patient Stated [name]: to go home PT [name] Formulation: With patient Time For [name] Achievement: 05/12/14 Potential to Achieve Goals: Good    Frequency     Barriers to discharge        Co-evaluation               End of Session  Activity Tolerance: Patient limited by fatigue;Patient limited by pain Patient left: in bed;with call bell/phone within reach;with bed alarm set Nurse Communication: Mobility status         Time: 2500-3704 PT Time Calculation (min) (ACUTE ONLY): 18 min   Charges:   PT Evaluation $Initial PT Evaluation Tier I: 1 Procedure     PT G CodesClaretha Franklin 04/28/2014, 11:15 AM [name] PT [phone]

## 2014-04-28 NOTE — Progress Notes (Signed)
Pt refused CPAP for tonight stating she felt like she was suffocating last night. Pt prefers to wear 2Lpm [place] instead SpO2 98%. RT will continue to monitor as needed.

## 2014-04-28 NOTE — Progress Notes (Signed)
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 Corticosteroids Rash    States she developed a rash on her tongue after a steroid injection in her back   Patient Measurements: Height: 5\' 7"  (170.2 cm) Weight: 288 lb 11.2 oz (130.953 kg) IBW/kg (Calculated) : 61.6 Heparin Dosing Weight: 92 kg  Vital Signs: Temp: 98.3 F (36.8 C) (02/09 0449) Temp Source: Oral (02/09 0449) BP: 130/59 mmHg (02/09 0449) Pulse Rate: 78 (02/09 0449)  Labs:  Recent Labs  04/25/14 1213  04/26/14 0443  04/27/14 0013 04/27/14 1024 04/27/14 1612 04/28/14 0416  HGB  --   < > 8.9*  --  9.1*  --   --  8.4*  HCT  --   --  28.3*  --  28.8*  --   --  26.8*  PLT  --   --  282  --  301  --   --  300  APTT 60*  --   --   --   --   --   --   --   LABPROT 22.2*  --  20.6*  --   --   --   --   --   INR 1.92*  --  1.75*  --   --   --   --   --   HEPARINUNFRC  --   < >  --   < >  --  0.35 0.35 0.46  CREATININE  --   --  2.57*  --  1.50*  --   --  0.86  < > = values in this interval not displayed.  Estimated Creatinine Clearance: 100.6 mL/min (by C-G formula based on Cr of 0.86).  Assessment: 59yo F presented 2/5 with RLQ pain and hematuria. Found to have hydronephrosis with likely blood in the collecting system, possible small stone. On chronic Coumadin for hx Lupus anticoagulant and bilateral DVT. INR was 7.08 on admission. She was given Vit.K 5mg  IV x 1 and pharmacy was asked to start heparin to bridge to planned urologic surgery.  Significant events:  2/6: INR 1.92 (from 7.08 on admit). Heparin started conservatively. First HL <0.1 on 1250units/hr. Increased to 1550units/hr. 2/7: Heparin held for cystoscopy with ureteral stent, resumed post-op 2/8: First HL in therapeutic range on 1750units/hr.    Today, 2/8:   HL 0.45 on  1750units/hr.  Resumed warfarin 2/8 with 10mg  dose  H/H a little lower. Pltc ok. No bleeding reported/documented (anticipate some blood in urine with stent per Urologist)  Renal function improved to wnl  Also on ASA EC 81mg  daily, not on PTA, but has hx of DM  [place]elle of Therapy:  Heparin level 0.3-0.7 units/ml  INR 2-3 Monitor platelets by anticoagulation protocol: Yes   Plan:   Continue heparin at 1750 units/hr ( = 17.35ml/hr)  Repeat warfarin 10mg  x 1 at 1800  Daily HL, INR, CBC  Minda Ditto PharmD Pager [phone] 04/28/2014, 11:45 AM

## 2014-04-29 DIAGNOSIS — D649 Anemia, unspecified: Secondary | ICD-10-CM

## 2014-04-29 DIAGNOSIS — E1165 Type 2 diabetes mellitus with hyperglycemia: Secondary | ICD-10-CM

## 2014-04-29 DIAGNOSIS — N1330 Unspecified hydronephrosis: Secondary | ICD-10-CM

## 2014-04-29 LAB — CBC
HCT: 27.3 % — ABNORMAL LOW (ref 36.0–46.0)
Hemoglobin: 8.5 g/dL — ABNORMAL LOW (ref 12.0–15.0)
MCH: 26.4 pg (ref 26.0–34.0)
MCHC: 31.1 g/dL (ref 30.0–36.0)
MCV: 84.8 fL (ref 78.0–100.0)
Platelets: 276 10*3/uL (ref 150–400)
RBC: 3.22 MIL/uL — ABNORMAL LOW (ref 3.87–5.11)
RDW: 17.2 % — ABNORMAL HIGH (ref 11.5–15.5)
WBC: 6.2 10*3/uL (ref 4.0–10.5)

## 2014-04-29 LAB — GLUCOSE, CAPILLARY
Glucose-Capillary: 104 mg/dL — ABNORMAL HIGH (ref 70–99)
Glucose-Capillary: 128 mg/dL — ABNORMAL HIGH (ref 70–99)
Glucose-Capillary: 118 mg/dL — ABNORMAL HIGH (ref 70–99)
Glucose-Capillary: 141 mg/dL — ABNORMAL HIGH (ref 70–99)

## 2014-04-29 LAB — BASIC METABOLIC PANEL
Anion gap: 6 (ref 5–15)
BUN: 10 mg/dL (ref 6–23)
CO2: 31 mmol/L (ref 19–32)
Calcium: 8.4 mg/dL (ref 8.4–10.5)
Chloride: 99 mmol/L (ref 96–112)
Creatinine, Ser: 0.75 mg/dL (ref 0.50–1.10)
GFR calc Af Amer: >90 mL/min (ref >90)
GFR calc non Af Amer: >90 mL/min (ref >90)
Glucose, Bld: 132 mg/dL — ABNORMAL HIGH (ref 70–99)
Potassium: 4 mmol/L (ref 3.5–5.1)
Sodium: 136 mmol/L (ref 135–145)

## 2014-04-29 LAB — PROTIME-INR
INR: 1.52 — ABNORMAL HIGH (ref 0.00–1.49)
Prothrombin Time: 18.4 seconds — ABNORMAL HIGH (ref 11.6–15.2)

## 2014-04-29 IMAGING — MR MR HIP*R* W/O CM
4 of 5 series · 23 of 40 positions shown · non-contrast
Comparison: Right hip radiographs 10/26/2011.

CLINICAL DATA: Severe right hip pain.

MRI OF THE RIGHT HIP WITHOUT CONTRAST
TECHNIQUE: Multiplanar, multisequence MR imaging was performed. No
intravenous contrast was administered.

[Series 3: T1 · axial · 6.0mm · 0.82mm/px · z∈[-114,+138]mm · 9 of 32 slices shown (1 of 2)]
[im 1/32]
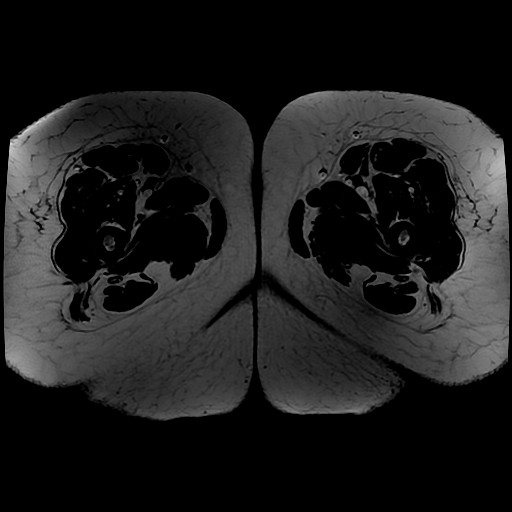
[im 4/32]
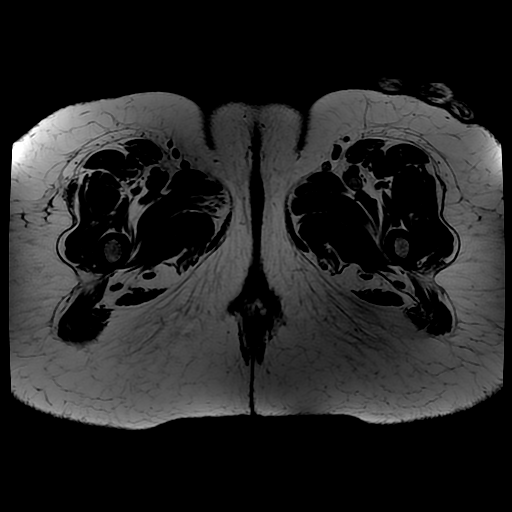
[im 8/32]
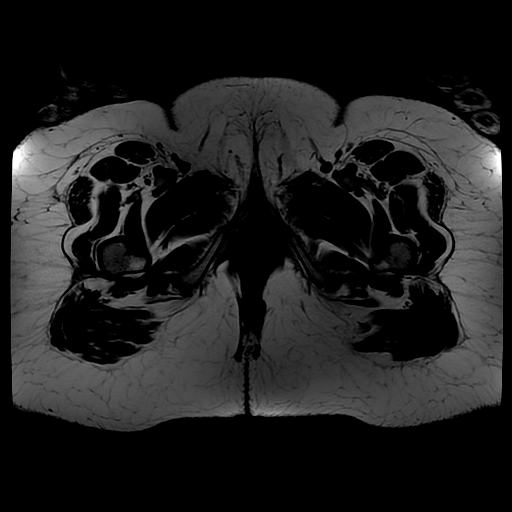
[im 12/32]
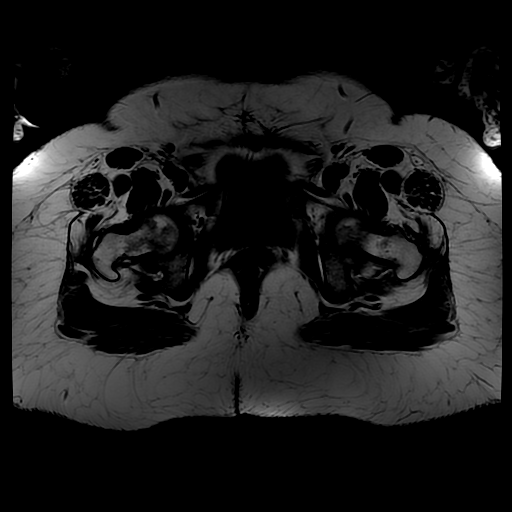
[im 16/32]
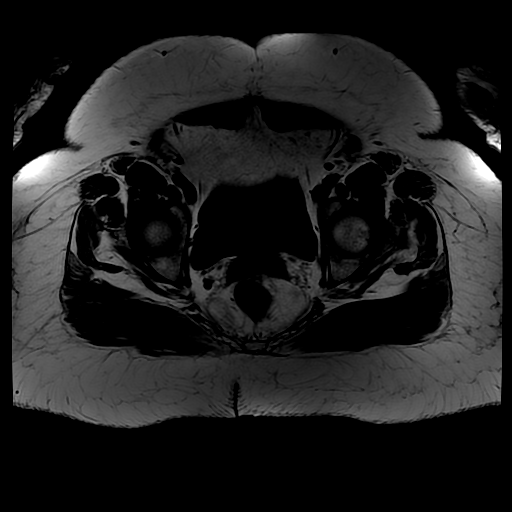
[im 20/32]
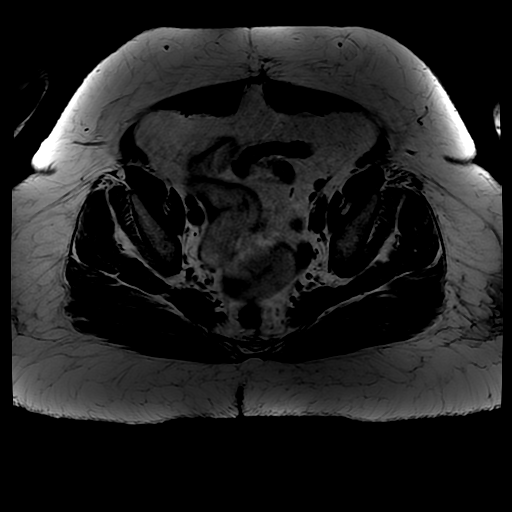
[im 24/32]
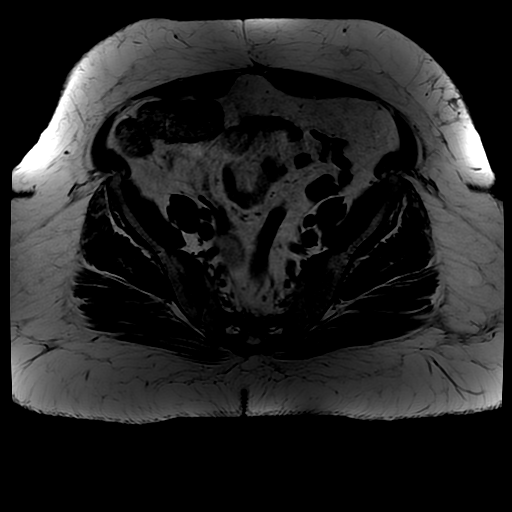
[im 28/32]
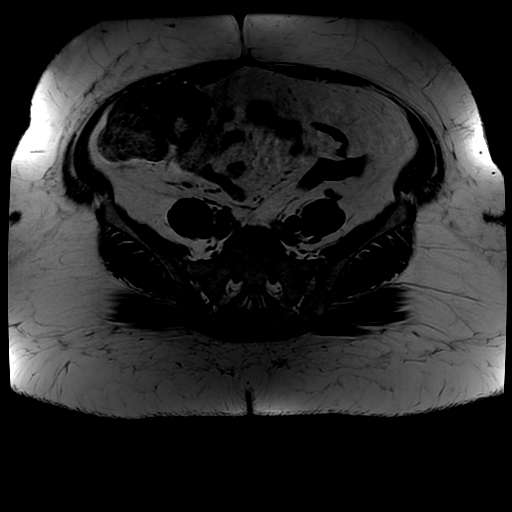
[im 32/32]
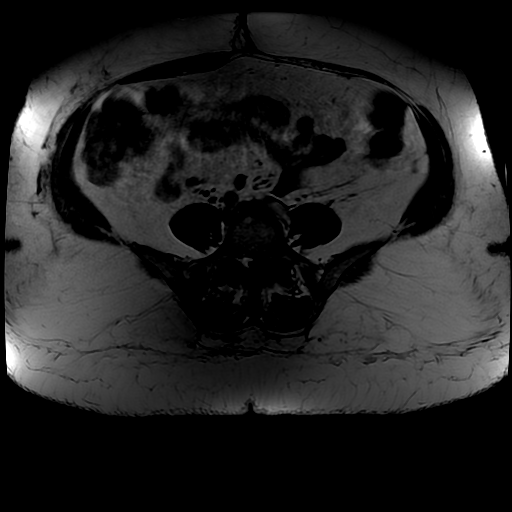

[Series 4: T2 · axial · 6.0mm · 0.82mm/px · z∈[-92,+111]mm · 3 of 37 slices shown]
[im 4/37]
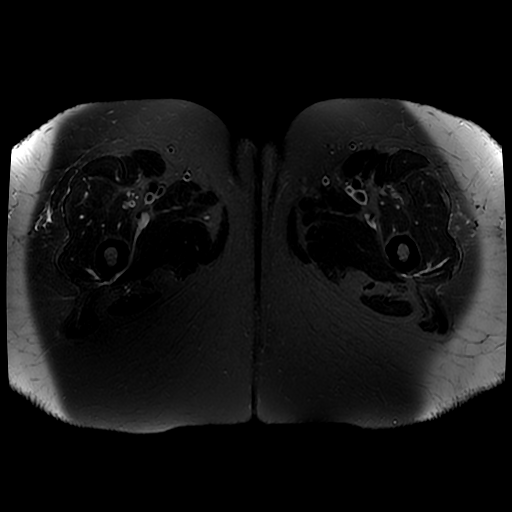
[im 19/37]
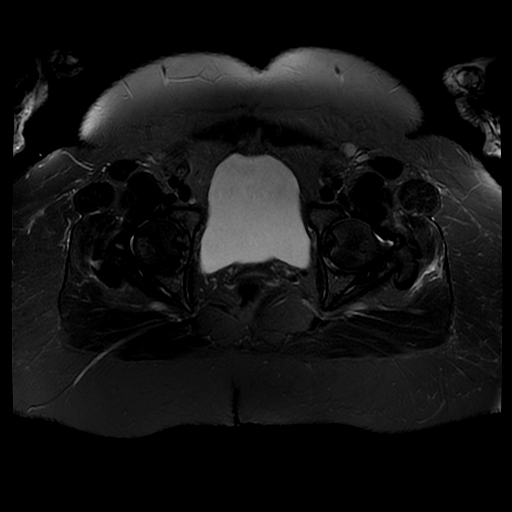
[im 33/37]
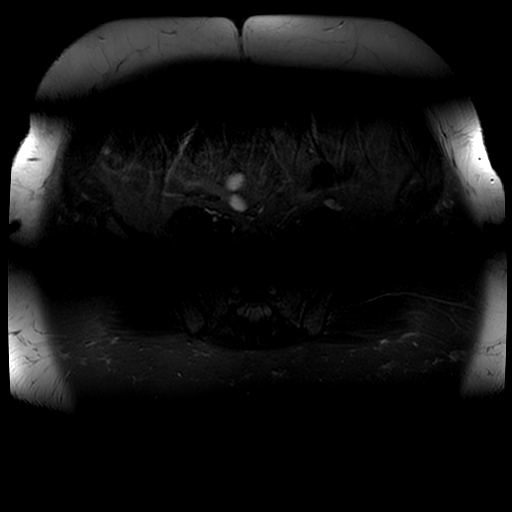

[Series 5: T1 · coronal · 5.0mm · 0.82mm/px · 5 of 23 slices shown (2 of 2)]
[im 1/23]
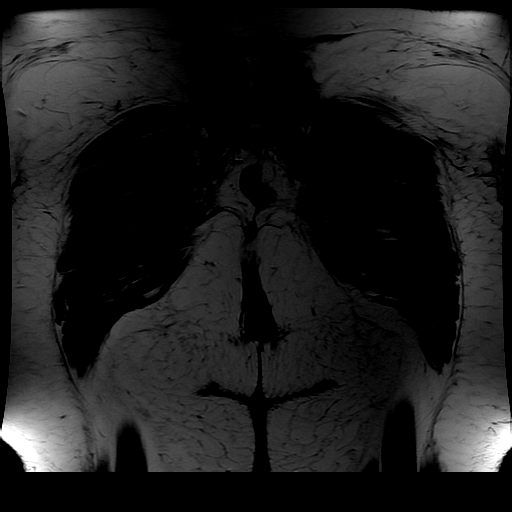
[im 4/23]
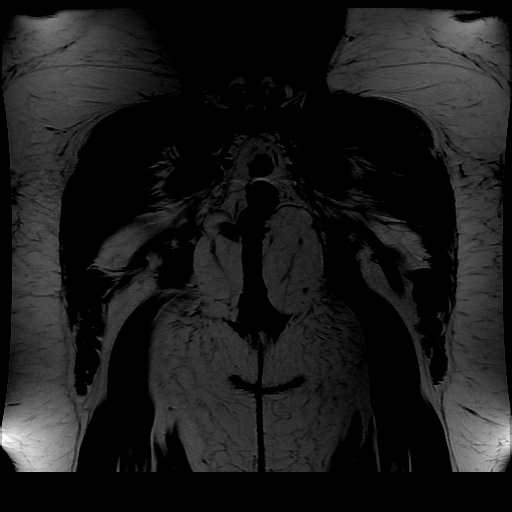
[im 8/23]
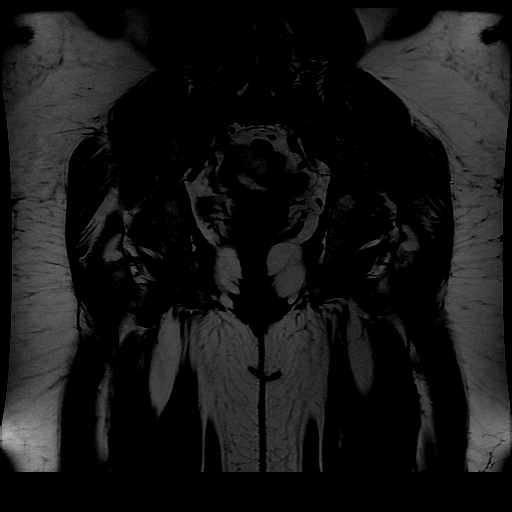
[im 12/23]
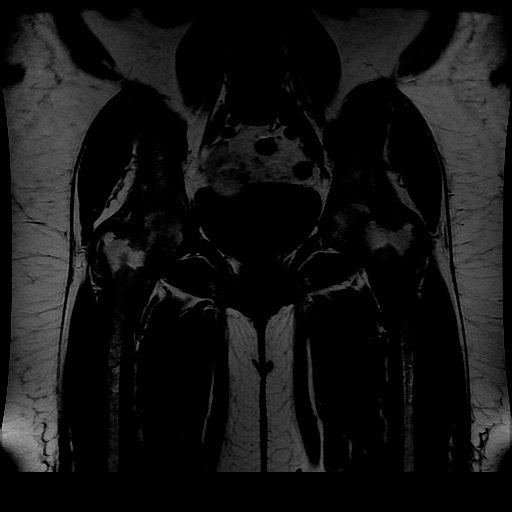
[im 19/23]
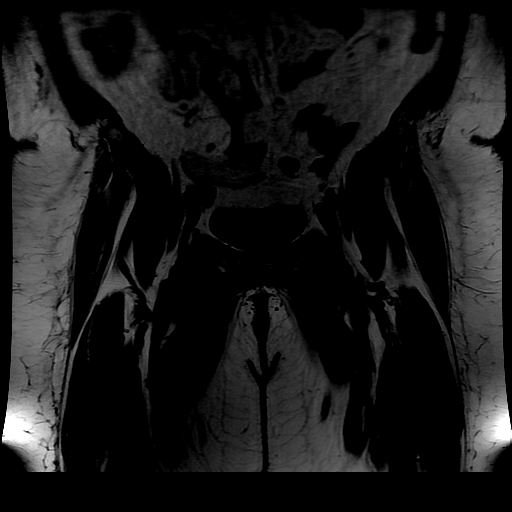

[Series 7: PD · sagittal · 4.0mm · 0.78mm/px · 6 of 21 slices shown]
[im 1/21]
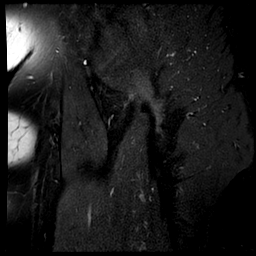
[im 5/21]
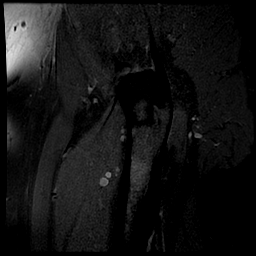
[im 9/21]
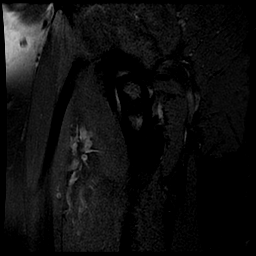
[im 13/21]
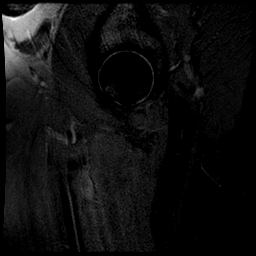
[im 17/21]
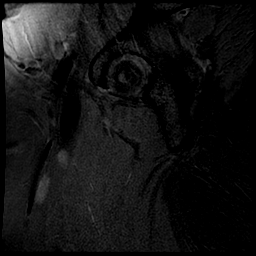
[im 21/21]
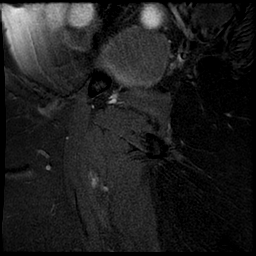

[23 of 40 positions shown; findings below may reference images not displayed]

FINDINGS: Both hips are normally located.  A mild symmetric hip
joint degenerative changes bilaterally.  No findings for stress
fracture or avascular necrosis.  No hip joint effusion.  No para-
articular fluid collections to suggest a paralabral cyst.  There is
bilateral gluteus medius tendonitis and trochanteric bursitis.

The pubic symphysis and SI joints are intact.  No sacral stress
fracture.  The surrounding hip and pelvic musculature is
unremarkable.  No tear, myositis or fatty atrophy.

No significant intrapelvic abnormalities.  No inguinal mass or
adenopathy.
IMPRESSION: 1.  No findings for stress fracture or avascular necrosis.
2.  Mild symmetric hip joint degenerative changes bilaterally.
3.  Bilateral gluteus medius tendonitis and trochanteric bursitis.

## 2014-04-29 MED ORDER — WARFARIN - PHARMACIST DOSING INPATIENT: Freq: Every day | Status: DC

## 2014-04-29 MED ORDER — WARFARIN SODIUM 10 MG PO TABS
10.0000 mg | ORAL_TABLET | Freq: Once | ORAL | Status: AC
Start: 2014-04-29 — End: 2014-04-29
  Administered 2014-04-29: 10 mg via ORAL
  Filled 2014-04-29: qty 1

## 2014-04-29 NOTE — Plan of Care (Signed)
Problem: Phase II Progression Outcomes Opie: Tolerating diet Outcome: Not Progressing Patient has poor appetite. Will consult with dietitian.

## 2014-04-29 NOTE — Progress Notes (Signed)
Patient refused CPAP tonight 

## 2014-04-29 NOTE — Progress Notes (Signed)
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 Corticosteroids Rash    States she developed a rash on her tongue after a steroid injection in her back    Patient Measurements: Height: 5\' 7"  (947.6 cm) Weight: 282 lb 9.6 oz (128.187 kg) IBW/kg (Calculated) : 61.6   Vital Signs: Temp: 97.9 F (36.6 C) (02/10 1303) Temp Source: Oral (02/10 1303) BP: 141/84 mmHg (02/10 1303) Pulse Rate: 85 (02/10 1303)  Labs:  Recent Labs  04/27/14 0013 04/27/14 1024 04/27/14 1612 04/28/14 0416 04/28/14 1030 04/29/14 0433  HGB 9.1*  --   --  8.4*  --  8.5*  HCT 28.8*  --   --  26.8*  --  27.3*  PLT 301  --   --  300  --  276  LABPROT  --   --   --   --  17.4* 18.4*  INR  --   --   --   --  1.41 1.52*  HEPARINUNFRC  --  0.35 0.35 0.46  --   --   CREATININE 1.50*  --   --  0.86  --  0.75    Estimated Creatinine Clearance: 106.7 mL/min (by C-G formula based on Cr of 0.75).   Medications:  Scheduled:  . amLODipine  5 mg Oral Daily  . antiseptic oral rinse  7 mL Mouth Rinse BID  . aspirin EC  81 mg Oral Daily  . budesonide-formoterol  2 puff Inhalation BID  . cloNIDine  0.1 mg Oral BID  . diltiazem  180 mg Oral Daily  . DULoxetine  60 mg Oral Daily  . ferrous sulfate  325 mg Oral BID WC  . furosemide  20 mg Oral BID  . gabapentin  300 mg Oral TID  . insulin aspart  0-9 Units Subcutaneous TID WC  . lisinopril  5 mg Oral Daily  . polyethylene glycol  17 g Oral BID  . pravastatin  40 mg Oral Daily  . QUEtiapine  50 mg Oral BID  . senna-docusate  1 tablet Oral BID    Assessment:  Asked to resume warfarin therapy today for this 59 year-old female, without bridging.  Warfarin 10 mg was given on 04/27/14.  Further anticoagulation has been held due to persistent hematuria and a drop in hemoglobin following a urology  procedure.  Today the physician's note states that hematuria is progressively decreasing, although some pink urine is expected to persist for a few days.  The warfarin dose recommended by the [facility] anticoagulation clinic is 10 mg daily.  This is a decrease from the previous dose of 12.5 mg daily.  Today's INR is subtherapeutic at 1.52.  Goals of Therapy:   INR 2-3  Prevention of VTE   Plan:   Warfarin 10 mg tonight.  Continue PT/INR daily  Monitor for worsening hematuria and changes in CBC.  [place]Ph. 04/29/2014,6:01 PM

## 2014-04-29 NOTE — Care Management Note (Signed)
CARE MANAGEMENT NOTE 04/29/2014  Patient:  [name], [name]   Account Number:  [id number]  Date Initiated:  04/29/2014  Documentation initiated by:  Marney Doctor  Subjective/Objective Assessment:   59 yo admitted with R kidney mass     Action/Plan:   From home alone with homehealth aide   Anticipated DC Date:  04/29/2014   Anticipated DC Plan:  [place]  CM consult      [facility] Choice  HOME HEALTH   Choice offered to / List presented to:  C-1 Patient        [place] arranged  HH-2 PT      [place]   Status of service:  In process, will continue to follow Medicare Important Message given?  YES (If response is "NO", the following Medicare IM given date fields will be blank) Date Medicare IM given:  04/29/2014 Medicare IM given by:  Marney Doctor Date Additional Medicare IM given:   Additional Medicare IM given by:    Discharge Disposition:    Per UR Regulation:    If discussed at Long Length of Stay Meetings, dates discussed:    Comments:  04/29/14 Marney Doctor RN,BSN,NCM [phone] Spoke with pt at bedside about DC plan. PT is recommending HHPT.  Pt lives at [facility] and plans to return there at DC.  Pt has used [place] for home health services in the past and would like to use them again.  [facility] rep called to give referral.  MD orders needed for HHPT. Pt has a RW at home and no additional equipment is needed at this time. CM will continue to follow.

## 2014-04-29 NOTE — Progress Notes (Signed)
Patient ID: [name], female   DOB: 08-11-1955, 59 y.o.   MRN: [id number]  [facility] PROGRESS NOTE  [name] SWH:[id number] DOB: 03-02-1956 DOA: 04/24/2014 PCP: Philis Fendt, MD   Brief narrative:    59 y.o. Female with history of cocaine abuse (stop smoking crack 2005), depression, fibromyalgia, COPD, OSA CPAP+2 L O2 at night, diabetes type 2, HTN, HLD,HLD, bilateral DVT on chronic anticoagulation, lupusanticoagulant positive (+beta-2-glycoprotein IgA antibody), adrenal mass, admitted with abd pain one day in duration, mostly RLQ, radiating to right flank area occasionally, 10/10 in severity with no specific aggravating factors.   Assessment/Plan:    Principal Problem: Right hydronephrosis / Flank Pain - s/p CYSTOSCOPY/ RIGHT RETROGRADE/ RIGHT URETEROSCOPY/URETERAL AND RENAL PELVIS BIOPSY 04/26/2014 - pathology report with some atypia and hemorrhage but no evidence of malignancy. Discussed with Dr. Bess Harvest, Urology who states low index of suspicion for malignancy and plans to repeat ureteroscopy electively as outpatient. - As per patient, lower abdominal pain and hematuria progressively improving. As discussed with Dr. Tresa Moore, urology and Dr. Benay Spice, oncology, will resume Coumadin tonight without bridging and monitor for worsening hematuria and CBC closely.   Right Adrenal Adenoma  - 3 cm right adrenal mass stable by imaging since 2013 - cortisol level stable but metanephrine levels elevated >Dr. Tresa Moore plans to evaluate electively as outpatient. - continue home BP regimen with Clonidine, resume Norvasc, Lisinopril, Cardizem (2/9)  Gross Hematuria  - new gross hematuria 04/2014. Prior smoker - Hematuria progressively decreasing. As per urology, patient will continue to have some pink urine for a few days. Okay to resume Coumadin.  Acute Renal Failure/ Hyponatremia  - stopped metformin and lisinopril on admission as both are contraindicated with ARF - Cr is now WNL  this AM and will resume Lisinopril today 2/9    Bilateral LE swelling  - this could be related to diastolic CHF - pt has known history of chronic diastolic CHF per last 2 D ECHO in 2015 - EF 55 % at that time but grade II diastolic dysfunction  - weight trend since admission: 126 --> 130 --> 131 --> 130 > 128 lbs. - placed on Lasix and will continue and monitor clinical response, continue Lasix 20 mg PO BID  - no need for repeat 2 D ECHO at this time   Chronic diastolic CHF - with now LE edema improved, weight trending noted above - continue Lasix 20 mg PO BID  - monitor renal function closely   Hypokalemia, hypomagnesemia  - continue to supplement as indicated, repeat BMP in AM - Mg supplemented and WNL 2/6  HTN (hypertension) - continue Clonidine, Lasix, Cardizem, Norvasc & Lisinopril - Controlled  Bilateral deep vein thromboses - doppler done on admission with no evidence of DVT - Hg dropped from 9.1 to 8.4 > stable over last 24 hours. - Start Coumadin 2/11  Anemia of chronic disease - Hg drop overnight and pt reports blood in urine and stool  - Hb stable.   COPD exacerbation/ seep apnea  - provide oxygen via [place]  - CPAP at night time   Morbid obesity, Body mass index is 43.6   Diabetes type 2, uncontrolled with complications of neuropathy - last A1C we have in the record is 6.9, 8 months ago - held Metformin due to renal failure (Cr 2.57 on admission)  - placed on SSI  - continue neurontin  - Cr is now WNL so may resume Metformin upon discharge     Code Status:  Full.  Family Communication: plan of care discussed with the patient Disposition Plan: Home when no further bleeding question of continuing AC resolved. Possible DC 2/11   IV access:  Peripheral IV  Procedures and diagnostic studies:   Ct Abdomen Pelvis W Contrast 04/24/2014 High-grade obstruction of the right kidney as discussed above. There is a tiny possible ureteral calculus  just above the iliac crest level, doubt this is causing the obstruction. High attenuation material in the collecting system and ureter could reflect blood. Could not exclude tumor. Recommend urology consultation and possible retrograde study. 2. Stable right adrenal gland lesion. 3. No other significant abdominal/pelvic findings.   Medical Consultants:  Urology   Other Consultants:  None   IAnti-Infectives:   None   Time spent: 25 minutes.  Vernell Leep, MD, FACP, FHM. [facility] Pager [phone]  If 7PM-7AM, please contact night-coverage [email or web address] Password [email or web address] 04/29/2014, 5:04 PM  04/29/2014, 4:55 PM   LOS: 5 days   HPI/Subjective: Lower abdominal pain has significantly improved compared to admission and hematuria continues to improve too.  Objective: Filed Vitals:   04/29/14 0459 04/29/14 0937 04/29/14 0944 04/29/14 1303  BP: 146/83 146/88  141/84  Pulse: 75   85  Temp: 98.3 F (36.8 C)   97.9 F (36.6 C)  TempSrc: Oral   Oral  Resp: 18   18  Height:      Weight:    128.187 kg (282 lb 9.6 oz)  SpO2: 100%  96% 93%    Intake/Output Summary (Last 24 hours) at 04/29/14 1655 Last data filed at 04/29/14 0919  Gross per 24 hour  Intake    360 ml  Output    600 ml  Net   -240 ml    Exam:   General:  Pt is alert, follows commands appropriately, not in acute distress   Cardiovascular: Regular rate and rhythm, S1/S2, no murmurs, no rubs, no gallops  Respiratory: Clear to auscultation bilaterally, no wheezing, no crackles, no rhonchi  Abdomen: Soft, non tender, non distended, bowel sounds present, no guarding  Extremities:  trace bilateral ankle edema, pulses DP and PT palpable bilaterally  Neuro: Grossly nonfocal  Data Reviewed: Basic Metabolic Panel:  Recent Labs Lab 04/24/14 2245 04/25/14 0430 04/26/14 0443 04/27/14 0013 04/28/14 0416 04/29/14 0433  NA  --  131* 135 134* 138 136  K 3.4* 3.4* 3.6 3.7 3.9 4.0  CL   --  95* 99 97 100 99  CO2  --  27 27 29 30 31   GLUCOSE  --  148* 152* 153* 131* 132*  BUN  --  15 27* 22 15 10   CREATININE  --  2.02* 2.57* 1.50* 0.86 0.75  CALCIUM  --  7.4* 7.6* 7.6* 8.1* 8.4  MG 1.2* 2.1  --   --   --   --    Liver Function Tests:  Recent Labs Lab 04/24/14 1701 04/25/14 0430  AST 24 21  ALT 20 20  ALKPHOS 92 80  BILITOT 0.7 1.1  PROT 8.2 7.3  ALBUMIN 4.2 3.7    Recent Labs Lab 04/24/14 1701  LIPASE 18   CBC:  Recent Labs Lab 04/24/14 1701 04/25/14 0430 04/26/14 0443 04/27/14 0013 04/28/14 0416 04/29/14 0433  WBC 10.3 7.6 5.7 6.6 5.3 6.2  NEUTROABS 7.9* 4.9  --   --   --   --   HGB 11.9* 10.1* 8.9* 9.1* 8.4* 8.5*  HCT 35.7* 31.9* 28.3* 28.8* 26.8* 27.3*  MCV 81.5 83.5 84.5 84.2  84.5 84.8  PLT 351 278 282 301 300 276   CBG:  Recent Labs Lab 04/28/14 1649 04/28/14 2310 04/29/14 0730 04/29/14 1301 04/29/14 1645  GLUCAP 147* 118* 104* 128* 118*    Recent Results (from the past 240 hour(s))  Culture, Urine     Status: None   Collection Time: 04/24/14  8:51 PM  Result Value Ref Range Status   Specimen Description URINE, CATHETERIZED  Final   Special Requests NONE  Final   Colony Count NO GROWTH Performed at [facility]   Final   Culture NO GROWTH Performed at [facility]   Final   Report Status 04/26/2014 FINAL  Final  Surgical pcr screen     Status: None   Collection Time: 04/26/14  6:05 AM  Result Value Ref Range Status   MRSA, PCR NEGATIVE NEGATIVE Final   Staphylococcus aureus NEGATIVE NEGATIVE Final    Comment:        The Xpert SA Assay (FDA approved for NASAL specimens in patients over 75 years of age), is one component of a comprehensive surveillance program.  Test performance has been validated by [facility] for patients greater than or equal to 66 year old. It is not intended to diagnose infection nor to guide or monitor treatment.      Scheduled Meds: . amLODipine  5 mg Oral Daily  .  antiseptic oral rinse  7 mL Mouth Rinse BID  . aspirin EC  81 mg Oral Daily  . budesonide-formoterol  2 puff Inhalation BID  . cloNIDine  0.1 mg Oral BID  . diltiazem  180 mg Oral Daily  . DULoxetine  60 mg Oral Daily  . ferrous sulfate  325 mg Oral BID WC  . furosemide  20 mg Oral BID  . gabapentin  300 mg Oral TID  . insulin aspart  0-9 Units Subcutaneous TID WC  . lisinopril  5 mg Oral Daily  . polyethylene glycol  17 g Oral BID  . pravastatin  40 mg Oral Daily  . QUEtiapine  50 mg Oral BID  . senna-docusate  1 tablet Oral BID   Continuous Infusions:

## 2014-04-30 ENCOUNTER — Encounter: Payer: Self-pay | Admitting: Pharmacist

## 2014-04-30 LAB — GLUCOSE, CAPILLARY
Glucose-Capillary: 126 mg/dL — ABNORMAL HIGH (ref 70–99)
Glucose-Capillary: 131 mg/dL — ABNORMAL HIGH (ref 70–99)

## 2014-04-30 LAB — CBC
HCT: 29.3 % — ABNORMAL LOW (ref 36.0–46.0)
Hemoglobin: 9.1 g/dL — ABNORMAL LOW (ref 12.0–15.0)
MCH: 26.4 pg (ref 26.0–34.0)
MCHC: 31.1 g/dL (ref 30.0–36.0)
MCV: 84.9 fL (ref 78.0–100.0)
Platelets: 327 10*3/uL (ref 150–400)
RBC: 3.45 MIL/uL — ABNORMAL LOW (ref 3.87–5.11)
RDW: 17.5 % — ABNORMAL HIGH (ref 11.5–15.5)
WBC: 6.1 10*3/uL (ref 4.0–10.5)

## 2014-04-30 LAB — PROTIME-INR
INR: 1.4 (ref 0.00–1.49)
Prothrombin Time: 17.3 seconds — ABNORMAL HIGH (ref 11.6–15.2)

## 2014-04-30 MED ORDER — WARFARIN SODIUM 10 MG PO TABS
10.0000 mg | ORAL_TABLET | Freq: Once | ORAL | Status: AC
  Administered 2014-04-30: 10 mg via ORAL
  Filled 2014-04-30: qty 1

## 2014-04-30 MED ORDER — HYDROCODONE-ACETAMINOPHEN 7.5-325 MG PO TABS: 1.0000 | ORAL_TABLET | Freq: Four times a day (QID) | ORAL | Status: DC | PRN

## 2014-04-30 NOTE — Progress Notes (Signed)
Call from [place], [place], regarding f/u appmt with pt being dc today She was admitted with INR=7 taking 10mg  daily and 12.5 mg three days a week. She was last instructed to take 10 mg daily when we saw her in [place] end of January. We will restart her on 10 mg daily and have her back in [place] on 05/05/14 at 9:30 lab and 9:45 CC. We will keep her appmt for 05/14/14 when she also sees Dr. Benay Spice

## 2014-04-30 NOTE — Progress Notes (Signed)
INITIAL NUTRITION ASSESSMENT  DOCUMENTATION CODES Per approved criteria  -Morbid Obesity   INTERVENTION: Encouraged PO intake RD to monitor PO intake  NUTRITION DIAGNOSIS: Inadequate oral intake related to poor appetite as evidenced by pt report.   Quirk: Pt to meet >/= 90% of their estimated nutrition needs   Monitor:  PO and supplemental intake, weight, labs, I/O's  Reason for Assessment: Consult for poor PO intake  Admitting Dx: Right kidney mass  ASSESSMENT: 59 y.o.PMHx HX cocaine abuse (stop smoking crack 2005), depression, fibromyalgia, COPD, OSA, diabetes type 2, HTN, HLD,HLD, bilateral DVT on chronic anticoagulation, lupusadrenal mass,Presents with abdominal pain that started yesterday.  Pt reports decreased appetite. Pt states that at home, she eats 1 meal/day. Pt states a desire to lose weight. Encouraged pt to consume more than 1 meal a day if weight loss is desired, emphasizing protein foods.  PO intake fluctuating: 0-100% Pt states that her weight is up from her UBW d/t fluid accumulation.   Nutrition focused physical exam shows no sign of depletion of muscle mass or body fat.  Labs reviewed: WNL  Height: Ht Readings from Last 1 Encounters:  04/24/14 5\' 7"  (1.702 m)    Weight: Wt Readings from Last 1 Encounters:  04/30/14 279 lb 14.4 oz (126.962 kg)    Ideal Body Weight: 135 lb  % Ideal Body Weight: 207%  Wt Readings from Last 10 Encounters:  04/30/14 279 lb 14.4 oz (126.962 kg)  10/13/13 276 lb 3.2 oz (125.283 kg)  08/17/13 281 lb 1.4 oz (127.5 kg)  02/17/13 260 lb (117.935 kg)  09/05/12 276 lb 9.6 oz (125.465 kg)  01/24/12 281 lb 4 oz (127.574 kg)  01/03/12 272 lb (123.378 kg)  12/07/11 275 lb 1.6 oz (124.785 kg)  11/30/11 272 lb (123.378 kg)  11/06/11 288 lb 9.3 oz (130.9 kg)    Usual Body Weight: 272 lb -per pt  % Usual Body Weight: 103%  BMI:  Body mass index is 43.83 kg/(m^2).  Estimated Nutritional Needs: Kcal:  1700-1900 Protein: 70-80g Fluid: 1.7L/day  Skin: perineal incision  Diet Order: Diet Carb Modified  EDUCATION NEEDS: -No education needs identified at this time  No intake or output data in the 24 hours ending 04/30/14 0955  Last BM: 2/9  Labs:   Recent Labs Lab 04/24/14 2245 04/25/14 0430  04/27/14 0013 04/28/14 0416 04/29/14 0433  NA  --  131*  < > 134* 138 136  K 3.4* 3.4*  < > 3.7 3.9 4.0  CL  --  95*  < > 97 100 99  CO2  --  27  < > 29 30 31   BUN  --  15  < > 22 15 10   CREATININE  --  2.02*  < > 1.50* 0.86 0.75  CALCIUM  --  7.4*  < > 7.6* 8.1* 8.4  MG 1.2* 2.1  --   --   --   --   GLUCOSE  --  148*  < > 153* 131* 132*  < > = values in this interval not displayed.  CBG (last 3)   Recent Labs  04/29/14 1645 04/29/14 2131 04/30/14 0737  GLUCAP 118* 141* 126*    Scheduled Meds: . amLODipine  5 mg Oral Daily  . antiseptic oral rinse  7 mL Mouth Rinse BID  . aspirin EC  81 mg Oral Daily  . budesonide-formoterol  2 puff Inhalation BID  . cloNIDine  0.1 mg Oral BID  . diltiazem  180 mg  Oral Daily  . DULoxetine  60 mg Oral Daily  . ferrous sulfate  325 mg Oral BID WC  . furosemide  20 mg Oral BID  . gabapentin  300 mg Oral TID  . insulin aspart  0-9 Units Subcutaneous TID WC  . lisinopril  5 mg Oral Daily  . polyethylene glycol  17 g Oral BID  . pravastatin  40 mg Oral Daily  . QUEtiapine  50 mg Oral BID  . senna-docusate  1 tablet Oral BID  . warfarin  10 mg Oral Once  . Warfarin - Pharmacist Dosing Inpatient   Does not apply q1800    Continuous Infusions:   Past Medical History  Diagnosis Date  . DVT (deep venous thrombosis)     Recurrent  . Adrenal mass 03/226    Benign  . Lupus Anticoagulant Positive   . Clotting disorder     +beta-2-glycoprotein IgA antibody  . History of cocaine abuse     Remote history   . Heart murmur   . Hypertension     takes meds daily  . Depression   . Shortness of breath     exertion or lying flat  . COPD  (chronic obstructive pulmonary disease)     inhalers dependent on environment  . Sleep apnea     2l of oxygen at night  . On home oxygen therapy     at night  . Diabetes mellitus     120s usually fasting  . GERD (gastroesophageal reflux disease)   . Dizziness, nonspecific   . Arthritis     knees/multiple orthopedic conditons; lower back  . History of blood transfusion   . Fibromyalgia   . Asthma     per pt    Past Surgical History  Procedure Laterality Date  . Carpal tunnel release      Bilateral  . Abdominal hysterectomy  1989    Fibroids  . Back surgery      cervical spine---disk disease  . Cesarean section    . Replacement total knee  11/17/2008    bilateral  . Right elbow surgery    . Tubal ligation    . Gallstones removed    . Anterior lumbar fusion  01/03/2012    Procedure: ANTERIOR LUMBAR FUSION 1 LEVEL;  Surgeon: Winfield Cunas, MD;  Location: [facility];  Service: Neurosurgery;  Laterality: N/A;  Lumbar Four-Five Anterior Lumbar Interbody Fusion with Instrumentation  . Cystoscopy/retrograde/ureteroscopy/stone extraction with basket Right 04/26/2014    Procedure: CYSTOSCOPY/ RIGHT RETROGRADE/ RIGHT URETEROSCOPY/URETERAL AND RENAL PELVIS BIOPSY;  Surgeon: Alexis Frock, MD;  Location: [facility];  Service: Urology;  Laterality: Right;    Clayton Bibles, MS, RD, LDN Pager: [phone] After Hours Pager: [phone]

## 2014-04-30 NOTE — Discharge Instructions (Signed)
Hydronephrosis Hydronephrosis is an abnormal enlargement of your kidney. It can affect one or both the kidneys. It results from the backward pressure of urine on the kidneys, when the flow of urine is blocked. Normally, the urine drains from the kidney through the urine tube (ureter), into a sac which holds the urine until urination (bladder). When the urinary flow is blocked, the urine collects above the block. This causes an increase in the pressure inside the kidney, which in turn leads to its enlargement. The block can occur at the point where the kidney joins the ureter. Treatment depends on the cause and location of the block.  CAUSES  The causes of this condition include:  Birth defect of the kidney or ureter.  Kink at the point where the kidney joins the ureter.  Stones and blood clots in the kidney or ureter.  Cancer, injury, or infection of the ureter.  Scar tissue formation.  Backflow of urine (reflux).  Cancer of bladder or prostate gland.  Abnormality of the nerves or muscles of the kidney or ureter.  Lower part of the ureter protruding into the bladder (ureterocele).  Abnormal contractions of the bladder.  Both the kidneys can be affected during pregnancy. This is because the enlarging uterus presses on the ureters and blocks the flow of urine. SYMPTOMS  The symptoms depend on the location of the block. They also depend on how long the block has been present. You may feel pain on the affected side. Sometimes, you may not have any symptoms. There may be a dull ache or discomfort in the flank. The common symptoms are:  Flank pain.  Swelling of the abdomen.  Pain in the abdomen.  Nausea and vomiting.  Fever.  Pain while passing urine.  Urgency for urination.  Frequent or urgent urination.  Infection of the urinary tract. DIAGNOSIS  Your caregiver will examine you after asking about your symptoms. You may be asked to do blood and urine tests. Your caregiver  may order a special X-ray, ultrasound, or CT scan. Sometimes a rigid or flexible telescope (cystoscope) is used to view the site of the blockage.  TREATMENT  Treatment depends on the site, cause, and duration of the block. The Moorer of treatment is to remove the blockage. Your caregiver will plan the treatment based on your condition. The different types of treatment are:   Putting in a soft plastic tube (ureteral stent) to connect the bladder with the kidney. This will help in draining the urine.  Putting in a soft tube (nephrostomy tube). This is placed through skin into the kidney. The trapped urine is drained out through the back. A plastic bag is attached to your skin to hold the urine that has drained out.  Antibiotics to treat or prevent infection.  Breaking down of the stone (lithotripsy). HOME CARE INSTRUCTIONS   It may take some time for the hydronephrosis to go away (resolve). Drink fluids as directed by your caregiver , and get a lot of rest.  If you have a drain in, your caregiver will give you directions about how to care for it. Be sure you understand these directions completely before you go home.  Take any antibiotics, pain medications, or other prescriptions exactly as prescribed.  Follow-up with your caregivers as directed. SEEK MEDICAL CARE IF:   You continue to have flank pain, nausea, or difficulty with urination.  You have any problem with any type of drainage device.  Your urine becomes cloudy or bloody. SEEK  IMMEDIATE MEDICAL CARE IF:   You have severe flank and/or abdominal pain.  You develop vomiting and are unable to hold down fluids.  You develop a fever above 100.5 F (38.1 C), or as per your caregiver. MAKE SURE YOU:   Understand these instructions.  Will watch your condition.  Will get help right away if you are not doing well or get worse. Document Released: 01/01/2007 Document Revised: 05/29/2011 Document Reviewed: 02/17/2010 [facility]  Patient Information 2015 [place], [place]. This information is not intended to replace advice given to you by your health care provider. Make sure you discuss any questions you have with your health care provider.  Hematuria Hematuria is blood in your urine. It can be caused by a bladder infection, kidney infection, prostate infection, kidney stone, or cancer of your urinary tract. Infections can usually be treated with medicine, and a kidney stone usually will pass through your urine. If neither of these is the cause of your hematuria, further workup to find out the reason may be needed. It is very important that you tell your health care provider about any blood you see in your urine, even if the blood stops without treatment or happens without causing pain. Blood in your urine that happens and then stops and then happens again can be a symptom of a very serious condition. Also, pain is not a symptom in the initial stages of many urinary cancers. HOME CARE INSTRUCTIONS   Drink lots of fluid, 3-4 quarts a day. If you have been diagnosed with an infection, cranberry juice is especially recommended, in addition to large amounts of water.  Avoid caffeine, tea, and carbonated beverages because they tend to irritate the bladder.  Avoid alcohol because it may irritate the prostate.  Take all medicines as directed by your health care provider.  If you were prescribed an antibiotic medicine, finish it all even if you start to feel better.  If you have been diagnosed with a kidney stone, follow your health care provider's instructions regarding straining your urine to catch the stone.  Empty your bladder often. Avoid holding urine for long periods of time.  After a bowel movement, women should cleanse front to back. Use each tissue only once.  Empty your bladder before and after sexual intercourse if you are a female. SEEK MEDICAL CARE IF:  You develop back pain.  You have a fever.  You have a  feeling of sickness in your stomach (nausea) or vomiting.  Your symptoms are not better in 3 days. Return sooner if you are getting worse. SEEK IMMEDIATE MEDICAL CARE IF:   You develop severe vomiting and are unable to keep the medicine down.  You develop severe back or abdominal pain despite taking your medicines.  You begin passing a large amount of blood or clots in your urine.  You feel extremely weak or faint, or you pass out. MAKE SURE YOU:   Understand these instructions.  Will watch your condition.  Will get help right away if you are not doing well or get worse. Document Released: 03/06/2005 Document Revised: 07/21/2013 Document Reviewed: 11/04/2012 [facility] Patient Information 2015 [place], [place]. This information is not intended to replace advice given to you by your health care provider. Make sure you discuss any questions you have with your health care provider.

## 2014-04-30 NOTE — Progress Notes (Signed)
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 Corticosteroids Rash    States she developed a rash on her tongue after a steroid injection in her back    Patient Measurements: Height: 5\' 7"  (170.2 cm) Weight: 279 lb 14.4 oz (126.962 kg) IBW/kg (Calculated) : 61.6   Vital Signs: Temp: 98.1 F (36.7 C) (02/11 0545) Temp Source: Oral (02/11 0545) BP: 125/54 mmHg (02/11 0545) Pulse Rate: 80 (02/11 0545)  Labs:  Recent Labs  04/27/14 1024 04/27/14 1612  04/28/14 0416 04/28/14 1030 04/29/14 0433 04/30/14 0436  HGB  --   --   < > 8.4*  --  8.5* 9.1*  HCT  --   --   --  26.8*  --  27.3* 29.3*  PLT  --   --   --  300  --  276 327  LABPROT  --   --   --   --  17.4* 18.4* 17.3*  INR  --   --   --   --  1.41 1.52* 1.40  HEPARINUNFRC 0.35 0.35  --  0.46  --   --   --   CREATININE  --   --   --  0.86  --  0.75  --   < > = values in this interval not displayed.  Estimated Creatinine Clearance: 106.2 mL/min (by C-G formula based on Cr of 0.75).   Medications:  Scheduled:  . amLODipine  5 mg Oral Daily  . antiseptic oral rinse  7 mL Mouth Rinse BID  . aspirin EC  81 mg Oral Daily  . budesonide-formoterol  2 puff Inhalation BID  . cloNIDine  0.1 mg Oral BID  . diltiazem  180 mg Oral Daily  . DULoxetine  60 mg Oral Daily  . ferrous sulfate  325 mg Oral BID WC  . furosemide  20 mg Oral BID  . gabapentin  300 mg Oral TID  . insulin aspart  0-9 Units Subcutaneous TID WC  . lisinopril  5 mg Oral Daily  . polyethylene glycol  17 g Oral BID  . pravastatin  40 mg Oral Daily  . QUEtiapine  50 mg Oral BID  . senna-docusate  1 tablet Oral BID  . Warfarin - Pharmacist Dosing Inpatient   Does not apply q1800    Assessment: 59yo F presented 2/5 with RLQ pain and hematuria. Found to have hydronephrosis with likely blood  in the collecting system, possible small stone. On chronic Coumadin for hx Lupus anticoagulant and bilateral DVT. INR was 7.08 on admission. She was given Vit.K 5mg  IV x 1 and pharmacy was asked to start heparin to bridge to planned urologic surgery.  Warfarin with heparin bridge was resumed post-op on 2/8  Patient experienced gross hematuria 2/8 PM and anticoagulation was held  Hematuria is resolving and Pharmacy was consulted to re-start warfarin therapy on 2/9 pm without heparin bridging  The warfarin dose recommended by the [facility] anticoagulation clinic is 10 mg daily.    Patient reported she was taking 12.5mg  daily PTA  Admission INR was SUPRA-therapeutic at 7.08  Today's INR is subtherapeutic at 1.40 as expected after restarting warfarin 2/9 pm  Hgb is low at 9.1 but stable  Platelets WNL at 327k  Noted patient ready for discharge today. Recommend patient to take warfarin 10mg  daily starting tomorrow 2/12  as patient will receive today's warfarin dose in the hospital prior to discharge. Patient has anti-coagulation follow-up appointment scheduled for Tues 2/16 at 9:30am  Goals of Therapy:   INR 2-3  Prevention of VTE   Plan:   Warfarin 10 mg today prior to discharge  Continue PT/INR daily  Monitor for worsening hematuria and changes in CBC.  Thank you for the consult.  Currie Paris, PharmD, BCPS Pager: [phone] Pharmacy: [phone] 04/30/2014 9:00 AM

## 2014-04-30 NOTE — Progress Notes (Signed)
4 Days Post-Op  Subjective:  1 - Right hydronephrosis / Flank Pain - acute on chronic right flank pain and new moderate right hydro with high density material (likely blood) in collecting system by contrast CT 04/24/2014. This is new since recent prior study 01/2014. Questionable tiny punctate stone, though this may be in gonadal vein as appear similar to prior studies. NO stone clearly visualized at ureteroscopy 04/26/2014.  Biopsies of Rt kidney pending mild atypia only (no frank cancer)  2 - Right Adrenal Adenoma - 3 cm right adrenal mass stable by imaging since 2013. No h/o refractory hypertension. Metanephrines c/w stress response post-op <4X U[facility], not consistent with pheo.   3 - , Gross Hematuria - new gross hematuria 04/2014. Prior smoker. She has h/o DVT / Lupus anticoagulant now on chronic coumadin.  4 - Acute Renal Failure - Cr 2.02 04/25/14 up from baseline <1. Right hydro as per above, admits to poor PO intake as well, now trending down after ureteral stenting.   PMH sig for DVT/PE, COPD, obesity, DM2, chole. Her PCP is Elroy Channel MD.  Today [place] is stable. Path favors benign tissue from renal pelvis, resolved gross hematuria.   Objective: Vital signs in last 24 hours: Temp:  [98.1 F (36.7 C)-98.5 F (36.9 C)] 98.1 F (36.7 C) (02/11 0545) Pulse Rate:  [78-80] 80 (02/11 0545) Resp:  [16-18] 16 (02/11 0545) BP: (125-159)/(54-76) 125/54 mmHg (02/11 0545) SpO2:  [98 %-99 %] 98 % (02/11 0545) Weight:  [126.962 kg (279 lb 14.4 oz)] 126.962 kg (279 lb 14.4 oz) (02/11 0500) Last BM Date: 04/28/14  Intake/Output from previous day: 02/10 0701 - 02/11 0700 In: 120 [P.O.:120] Out: -  Intake/Output this shift:    General appearance: alert, cooperative and appears stated age Ears: Nl external exam, wearing headphones. Nose: Nares normal. Septum midline. Mucosa normal. No drainage or sinus tenderness. Throat: lips, mucosa, and tongue normal; teeth and gums normal Neck: supple,  symmetrical, trachea midline Back: symmetric, no curvature. ROM normal. No CVA tenderness. Resp: non-labored on room air Cardio: regular rate and rhythm, S1, S2 normal, no murmur, click, rub or gallop GI: soft, non-tender; bowel sounds normal; no masses,  no organomegaly Female genitalia: normal Extremities: extremities normal, atraumatic, no cyanosis or edema Pulses: 2+ and symmetric Skin: Skin color, texture, turgor normal. No rashes or lesions Lymph nodes: Cervical, supraclavicular, and axillary nodes normal. Neurologic: Grossly normal Incision/Wound: NO CVAT  Lab Results:   Recent Labs  04/29/14 0433 04/30/14 0436  WBC 6.2 6.1  HGB 8.5* 9.1*  HCT 27.3* 29.3*  PLT 276 327   BMET  Recent Labs  04/28/14 0416 04/29/14 0433  NA 138 136  K 3.9 4.0  CL 100 99  CO2 30 31  GLUCOSE 131* 132*  BUN 15 10  CREATININE 0.86 0.75  CALCIUM 8.1* 8.4   PT/INR  Recent Labs  04/29/14 0433 04/30/14 0436  LABPROT 18.4* 17.3*  INR 1.52* 1.40   ABG No results for input(s): PHART, HCO3 in the last 72 hours.  Invalid input(s): PCO2, PO2  Studies/Results: No results found.  Anti-infectives: Anti-infectives    Start     Dose/Rate Route Frequency Ordered Stop   04/26/14 0630  ceFAZolin (ANCEF) powder 1 g  Status:  Discontinued     1 g Other To Surgery 04/26/14 0618 04/26/14 0621   04/26/14 0630  ceFAZolin (ANCEF) IVPB 2 g/50 mL premix     2 g 100 mL/hr over 30 Minutes Intravenous  Once 04/26/14  0923 04/26/14 0736      Assessment/Plan:  1 - Right hydronephrosis / Flank Pain - unclear etiology by imaging alone. DDX spontanous bleed / clot in setting supratheraputic IVR v. Urothelial malignancy v. Tiny stone. Discussed plan of outpatient repeat diagnostic ureteroscopy in around 4-6 weeks to maximally rule out lesion / stone, then remove stent.   2 - Right Adrenal Adenoma -likely benign adenoma by size and stability.   3 - Gross Hematuria - favor inflammation in setting  supratheraputic INR or small stone as per abvoe, resolved grosslyi.   4 - Acute Renal Failure - likely multifactorial with obstruction (rt hydro) and pre-renal (poor recent PO intake). Improving s/p renal decompression.  5 - WE will arrange outpatient GU follow up in few weeks.   [facility], [name] 04/30/2014

## 2014-05-02 NOTE — Discharge Summary (Signed)
Physician Discharge Summary  Noa Galvao Wemhoff QPY:[id number] DOB: 1955/07/08 DOA: 04/24/2014  PCP: Philis Fendt, MD  Admit date: 04/24/2014 Discharge date: 04/30/2014  Time spent: Greater than 30 minutes  Recommendations for Outpatient Follow-up:  1. Dr. [name], PCP in 5 days. [place] Coumadin clinic on 05/05/14 with repeat labs (CBC, BMP, PT & INR). Coumadin dose adjustment. Lab results to be forwarded to patient's PCP. 3. Dr. Alexis Frock, Urology: MDs office will call with follow-up appointment.  Discharge Diagnoses:  Principal Problem:   Right kidney mass Active Problems:   Hypokalemia   HTN (hypertension)   Bilateral deep vein thromboses   COPD exacerbation   Morbid obesity   Obstruction of kidney   Diabetes type 2, uncontrolled   Chronic anticoagulation   Crack cocaine use   Depression   Fibromyalgia   COPD (chronic obstructive pulmonary disease)   Obstructive sleep apnea on CPAP   Lupus anticoagulant positive   Adrenal mass, right   Hydronephrosis of right kidney   Supratherapeutic INR   Renal mass, right   Diabetes type 2, controlled   Right lower quadrant abdominal pain   Discharge Condition: Improved & Stable  Diet recommendation: Heart healthy and diabetic diet.  Filed Weights   04/28/14 0449 04/29/14 1303 04/30/14 0500  Weight: 130.953 kg (288 lb 11.2 oz) 128.187 kg (282 lb 9.6 oz) 126.962 kg (279 lb 14.4 oz)    History of present illness:  59 y.o. Female with history of cocaine abuse (stop smoking crack 2005), depression, fibromyalgia, COPD, OSA CPAP+2 L O2 at night, diabetes type 2, HTN, HLD,HLD, bilateral DVT on chronic anticoagulation, lupusanticoagulant positive (+beta-2-glycoprotein IgA antibody), adrenal mass, admitted with abd pain one day in duration, mostly RLQ, radiating to right flank area occasionally, 10/10 in severity with no specific aggravating factors.  Hospital Course:   1. Right hydronephrosis/flank  pain: Urology was consulted. She had acute on chronic right flank pain and new moderate right hydronephrosis with high density material (likely blood) in collecting system by contrast CT to/5/16. This was new since recent prior study 01/2014. Questionable tiny punctate stone, though this may be gonadal vein as appears similar to prior studies. She underwent cystoscopy with right retrograde pyelogram, right ureteral stent and right ureteroscopy with biopsy on 04/26/14. No stone clearly visualized at ureteroscopy. Biopsy of right kidney showed mild atypia and no frank cancer. Etiology of her right hydronephrosis not totally clear. DD-spontaneous bleed/clot in setting of supratherapeutic INR versus urothelial malignancy versus tiny stone. As discussed with Dr. Tresa Moore on day of discharge, patient cleared for discharge home, okay to resume Coumadin, patient will likely have some blood-tinged urine for a couple of weeks and he will arrange outpatient follow-up with repeat diagnostic ureteroscopy in 4-6 weeks to rule out lesion/stone, then remove stent. As per patient, hematuria starting to clear up. 2. Right adrenal adenoma: Likely benign by size and stability. However plasma free and total normetanephrine elevated suggesting activity. Discussed with Dr. Tresa Moore who will follow-up and address as outpatient. 3. Gross hematuria: Possibly secondary to inflammation in the setting of supratherapeutic INR or small stone. As per problem #1. Significantly improved and hematuria clearing up. Urine culture negative. 4. Acute renal failure: Multifactorial secondary to right hydronephrosis and prerenal from recent poor by mouth intake. Resolved. 5. Bilateral lower extremity edema/possible acute on chronic diastolic CHF: 2-D echo 1245: EF 55% and grade 2 diastolic dysfunction. She was briefly treated with Lasix with good response. She will be discharged  back on home dose of HCTZ. 6. Hypokalemia/hypomagnesemia: Replaced. 7. Essential  hypertension: Fluctuating and mildly uncontrolled at times. Patient on polypharmacy-amlodipine, clonidine, diltiazem, HCTZ and lisinopril. Close follow-up outpatient and  8. History of bilateral DVT: Anticoagulation had been temporarily held secondary to hematuria. As discussed with urology and Dr. Benay Spice, okay to resume Coumadin anticoagulation without bridging. 9. Anemia of chronic disease: Hemoglobin stable. Outpatient follow-up. 10. COPD/OSA: Stable 11. Morbid obesity 12. Uncontrolled type II DM with neuropathy: A1c 6.9 last year. Resume metformin.  Consultations:  Urology  Procedures: PROCEDURE: 1. Cystoscopy with right retrograde pyelogram interpretation. 2. Right ureteral stent 5 x 24 Polaris, no tether. 3. Right ureteroscopy with ureteroscopic biopsy.  FINDINGS: 1. Mild right hydronephrosis without ureteral nephrosis. 2. Significant inflammation and erythema of the right proximal ureter  and renal pelvis. 3. Significant blood products within the right renal pelvis. Overall  picture worrisome for possible recently passed stone versus early  urothelial cancer, representative areas biopsied. 4. Excellent placement of right ureteral stent, proximal end of the  renal pelvis, distal end of the urinary bladder.   Discharge Exam:  Complaints:  Patient states that her hematuria continues to improve and is only minimally blood-tinged at this time. Intermittent right groin cramping pain. No chest pain or dyspnea. Tolerating diet.  Filed Vitals:   04/29/14 2102 04/30/14 0500 04/30/14 0545 04/30/14 1538  BP: 159/76  125/54 151/90  Pulse: 78  80 86  Temp: 98.5 F (36.9 C)  98.1 F (36.7 C) 97.4 F (36.3 C)  TempSrc: Oral  Oral Oral  Resp: 18  16 16   Height:      Weight:  126.962 kg (279 lb 14.4 oz)    SpO2: 99%  98% 100%     General: Pt is alert, follows commands appropriately, not in acute distress  Cardiovascular: Regular rate and rhythm, S1/S2, no  murmurs, no rubs, no gallops  Respiratory: Clear to auscultation bilaterally, no wheezing, no crackles, no rhonchi  Abdomen: Soft, non tender, non distended, bowel sounds present, no guarding  Extremities: trace bilateral ankle edema, pulses DP and PT palpable bilaterally  Neuro: Grossly nonfocal  Discharge Instructions  Discharge Instructions    (HEART FAILURE PATIENTS) Call MD:  Anytime you have any of the following symptoms: 1) 3 pound weight gain in 24 hours or 5 pounds in 1 week 2) shortness of breath, with or without a dry hacking cough 3) swelling in the hands, feet or stomach 4) if you have to sleep on extra pillows at night in order to breathe.    Complete by:  As directed      Call MD for:  difficulty breathing, headache or visual disturbances    Complete by:  As directed      Call MD for:  persistant nausea and vomiting    Complete by:  As directed      Call MD for:  severe uncontrolled pain    Complete by:  As directed      Call MD for:  temperature >100.4    Complete by:  As directed      Call MD for:    Complete by:  As directed   Worsening blood in urine.     Diet - low sodium heart healthy    Complete by:  As directed      Diet Carb Modified    Complete by:  As directed      Increase activity slowly    Complete by:  As directed  Medication List    STOP taking these medications        orphenadrine 100 MG tablet  Commonly known as:  NORFLEX     oxyCODONE-acetaminophen 5-325 MG per tablet  Commonly known as:  PERCOCET/ROXICET     traMADol 50 MG tablet  Commonly known as:  ULTRAM      TAKE these medications        albuterol 108 (90 BASE) MCG/ACT inhaler  Commonly known as:  PROVENTIL HFA;VENTOLIN HFA  Inhale 2 puffs into the lungs every 4 (four) hours as needed for shortness of breath.     amLODipine 5 MG tablet  Commonly known as:  NORVASC  Take 5 mg by mouth daily.     cloNIDine 0.1 MG tablet  Commonly known as:  CATAPRES  Take  0.1 mg by mouth 2 (two) times daily.     diltiazem 180 MG 24 hr capsule  Commonly known as:  CARDIZEM CD  Take 180 mg by mouth daily.     DULoxetine 60 MG capsule  Commonly known as:  CYMBALTA  Take 60 mg by mouth daily.     ferrous sulfate 325 (65 FE) MG tablet  Take 1 tablet (325 mg total) by mouth 2 (two) times daily with a meal.     gabapentin 300 MG capsule  Commonly known as:  NEURONTIN  Take 300 mg by mouth 3 (three) times daily.     hydrochlorothiazide 12.5 MG capsule  Commonly known as:  MICROZIDE  Take 12.5 mg by mouth daily.     HYDROcodone-acetaminophen 7.5-325 MG per tablet  Commonly known as:  NORCO  Take 1 tablet by mouth every 6 (six) hours as needed for moderate pain or severe pain.     lisinopril 5 MG tablet  Commonly known as:  PRINIVIL,ZESTRIL  Take 5 mg by mouth daily.     loratadine 10 MG tablet  Commonly known as:  CLARITIN  Take 10 mg by mouth daily as needed for allergies.     metFORMIN 500 MG tablet  Commonly known as:  GLUCOPHAGE  Take 500 mg by mouth at bedtime.     omeprazole 20 MG capsule  Commonly known as:  PRILOSEC  Take 20 mg by mouth 2 (two) times daily.     polyethylene glycol packet  Commonly known as:  MIRALAX / GLYCOLAX  Take 17 g by mouth daily.     pravastatin 40 MG tablet  Commonly known as:  PRAVACHOL  Take 1 tablet by mouth daily.     QUEtiapine 50 MG tablet  Commonly known as:  SEROQUEL  Take 50 mg by mouth 2 (two) times daily.     warfarin 10 MG tablet  Commonly known as:  COUMADIN  Take 10 mg by mouth daily.           Follow-up Information    Follow up with AVBUERE,EDWIN A, MD. Schedule an appointment as soon as possible for a visit in 5 days.   Specialty:  Internal Medicine   Why:  To be seen with repeat labs (supposed to have labs drawn at [place] on 05/04/14). Will need adjustment of BP medications.   Contact information:   [place] [place] [address] [phone]       Follow up  with Coumadin clinic at [place] On 05/04/2014.   Why:  To be seen for repeat labs (CBC, BMP, PT & INR) and Coumadin dose adjustment. Please forward labs to patient's  PCP.      Follow up with Alexis Frock, MD.   Specialty:  Urology   Why:  MDs office will call with follow-up appointment.   Contact information:   [place] [address] [phone]        The results of significant diagnostics from this hospitalization (including imaging, microbiology, ancillary and laboratory) are listed below for reference.    Significant Diagnostic Studies: Ct Abdomen Pelvis W Contrast  04/24/2014   CLINICAL DATA:  Right lower quadrant and flank pain since yesterday.  EXAM: CT ABDOMEN AND PELVIS WITH CONTRAST  TECHNIQUE: Multidetector CT imaging of the abdomen and pelvis was performed using the standard protocol following bolus administration of intravenous contrast.  CONTRAST:  51mL OMNIPAQUE IOHEXOL 300 MG/ML SOLN, 172mL OMNIPAQUE IOHEXOL 300 MG/ML SOLN  COMPARISON:  CT scan 02/09/2014  FINDINGS: Lower chest: The lung bases are clear except for dependent atelectasis. The heart is mildly enlarged. No pericardial effusion. The distal esophagus is grossly normal.  Hepatobiliary: No worrisome hepatic lesions. Small cysts are noted. The gallbladder surgically absent. No common bile duct dilatation.  Pancreas: No inflammatory changes or mass lesion.  Spleen: Normal size.  No focal lesions.  Adrenals/Urinary Tract: Stable right adrenal gland adenoma since 2013. This is a lipid poor adenoma. The left adrenal gland is normal. The left kidney is normal. The right kidney demonstrates high-grade obstructive findings with decreased perfusion and marked perinephric interstitial changes. There is hydroureteronephrosis without obvious obstructing ureteral calculus. There may be a tiny calculus in the right ureter on axial image number 50. There is high attenuation material in the  collecting system and upper ureter which could represent hemorrhage. Tumor cannot be excluded. Recommend urology consultation and possible retrograde study.  Stomach/Bowel: The stomach, duodenum, small bowel and colon are unremarkable. No inflammatory changes, mass lesions or obstructive findings. The appendix is normal.  Vascular/Lymphatic: No mesenteric or retroperitoneal mass or lymphadenopathy. Small scattered lymph nodes are noted. The aorta is normal in caliber. Mild tortuosity. The branch vessels are patent. The major venous structures are patent.  Reproductive: The uterus is surgically absent. Both ovaries are still present and appear stable. No pelvic mass or lymphadenopathy. No inguinal mass or adenopathy. The bladder appears normal.  Other: No abdominal wall hernia or subcutaneous lesions.  Musculoskeletal: No significant bony findings. Stable surgical changes at L4-5.  IMPRESSION: 1. High-grade obstruction of the right kidney as discussed above. There is a tiny possible ureteral calculus just above the iliac crest level but I doubt this is causing the obstruction. High attenuation material in the collecting system and ureter could reflect blood. Could not exclude tumor. Recommend urology consultation and possible retrograde study. 2. Stable right adrenal gland lesion. 3. No other significant abdominal/pelvic findings.   Electronically Signed   By: Kalman Jewels M.D.   On: 04/24/2014 20:08    Microbiology: Recent Results (from the past 240 hour(s))  Culture, Urine     Status: None   Collection Time: 04/24/14  8:51 PM  Result Value Ref Range Status   Specimen Description URINE, CATHETERIZED  Final   Special Requests NONE  Final   Colony Count NO GROWTH Performed at [facility]   Final   Culture NO GROWTH Performed at [facility]   Final   Report Status 04/26/2014 FINAL  Final  Surgical pcr screen     Status: None   Collection Time: 04/26/14  6:05 AM  Result Value Ref  Range Status  MRSA, PCR NEGATIVE NEGATIVE Final   Staphylococcus aureus NEGATIVE NEGATIVE Final    Comment:        The Xpert SA Assay (FDA approved for NASAL specimens in patients over 62 years of age), is one component of a comprehensive surveillance program.  Test performance has been validated by [facility] for patients greater than or equal to 32 year old. It is not intended to diagnose infection nor to guide or monitor treatment.      Labs: Basic Metabolic Panel:  Recent Labs Lab 04/26/14 0443 04/27/14 0013 04/28/14 0416 04/29/14 0433  NA 135 134* 138 136  K 3.6 3.7 3.9 4.0  CL 99 97 100 99  CO2 27 29 30 31   GLUCOSE 152* 153* 131* 132*  BUN 27* 22 15 10   CREATININE 2.57* 1.50* 0.86 0.75  CALCIUM 7.6* 7.6* 8.1* 8.4   Liver Function Tests: No results for input(s): AST, ALT, ALKPHOS, BILITOT, PROT, ALBUMIN in the last 168 hours. No results for input(s): LIPASE, AMYLASE in the last 168 hours. No results for input(s): AMMONIA in the last 168 hours. CBC:  Recent Labs Lab 04/26/14 0443 04/27/14 0013 04/28/14 0416 04/29/14 0433 04/30/14 0436  WBC 5.7 6.6 5.3 6.2 6.1  HGB 8.9* 9.1* 8.4* 8.5* 9.1*  HCT 28.3* 28.8* 26.8* 27.3* 29.3*  MCV 84.5 84.2 84.5 84.8 84.9  PLT 282 301 300 276 327   Cardiac Enzymes: No results for input(s): CKTOTAL, CKMB, CKMBINDEX, TROPONINI in the last 168 hours. BNP: BNP (last 3 results) No results for input(s): BNP in the last 8760 hours.  ProBNP (last 3 results)  Recent Labs  08/17/13 0953  PROBNP 54.7    CBG:  Recent Labs Lab 04/29/14 1301 04/29/14 1645 04/29/14 2131 04/30/14 0737 04/30/14 1150  GLUCAP 128* 118* 141* 126* 131*     Additional labs: 04/26/2014 Diagnosis Renal pelvis, biopsy, inflamed renal pelvis - HEMORRHAGE AND MILD UROTHELIAL ATYPIA. - SEE MICROSCOPIC DESCRIPTION. Microscopic Comment There are urothelial mucosal fragments with marked stromal hemorrhage and the urothelial lining shows  mild cytologic atypia including occasional mitotic figures. The features favor reactive atypia in these biopsies and there is no evidence of invasive carcinoma.   Signed:  Vernell Leep, MD, FACP, FHM. [facility] Pager [phone]  If 7PM-7AM, please contact night-coverage [email or web address] Password [facility] 05/02/2014, 5:31 PM

## 2014-05-04 ENCOUNTER — Telehealth: Payer: Self-pay | Admitting: Pharmacist

## 2014-05-04 ENCOUNTER — Telehealth: Payer: Self-pay

## 2014-05-04 NOTE — Telephone Encounter (Signed)
Returning pt call. She cannot make it b/c her roads are not cleared of snow. She is recently out of hospital. This is lab and CC visit. Forward message to CC for r/s.

## 2014-05-04 NOTE — Telephone Encounter (Signed)
Pt called and wished to reschedule her lab and CC appointment for 05/05/14. She has been rescheduled for 05/07/14; lab at 10am and coumadin clinic at 10:15am. Message sent to schedulers.

## 2014-05-05 ENCOUNTER — Other Ambulatory Visit: Payer: Medicare Other

## 2014-05-05 ENCOUNTER — Ambulatory Visit: Payer: Medicare Other

## 2014-05-07 ENCOUNTER — Other Ambulatory Visit (HOSPITAL_BASED_OUTPATIENT_CLINIC_OR_DEPARTMENT_OTHER): Payer: Medicare Other

## 2014-05-07 ENCOUNTER — Ambulatory Visit (HOSPITAL_BASED_OUTPATIENT_CLINIC_OR_DEPARTMENT_OTHER): Payer: Medicare Other | Admitting: Pharmacist

## 2014-05-07 DIAGNOSIS — I82403 Acute embolism and thrombosis of unspecified deep veins of lower extremity, bilateral: Secondary | ICD-10-CM

## 2014-05-07 DIAGNOSIS — I824Y3 Acute embolism and thrombosis of unspecified deep veins of proximal lower extremity, bilateral: Secondary | ICD-10-CM

## 2014-05-07 LAB — PROTIME-INR
INR: 4.7 — ABNORMAL HIGH (ref 2.00–3.50)
Protime: 56.4 Seconds — ABNORMAL HIGH (ref 10.6–13.4)

## 2014-05-07 LAB — POCT INR: INR: 4.7

## 2014-05-07 NOTE — Progress Notes (Signed)
INR above [place] today at 4.7 Pt has been released from hospital recently due to right flank pain Urology consulted and performed kidney procedure with stent placement INR elevated on admission Coumadin held Coumadin restarted on 2/9 at 10 mg daily Pt did not take coumadin last night (2/17)  INR still elevated Pt not eating well due to Nausea and diarrhea (states eating crackers mostly) No other unusual bleeding or bruising to report Discharge summary mentions "patient will likely have some blood-tinged urine for a couple of weeks and will arrange outpatient follow-up with repeat diagnostic ureteroscopy in 4-6 weeks to rule out lesion/stone, then remove stent. As per patient, hematuria starting to clear up." No medication changes Pt states she has both 5 mg tablets and 10 mg tablets (she confirmed the two colors) and she would like to alternate between the two different strengths vs. Taking two 5 mg tablets Plan:  Do not take Coumadin today or tomorrow and take 5 mg (pink tablet) on Saturday,  then decrease to 10mg  (white tablet) Daily except 5 mg (pink tablet) on Monday, Wednesday and Friday Recheck INR on 05/14/14; Lab at 11:45am, Coumadin clinic at 12:00 pm, and 12:15pm with Dr. Benay Spice

## 2014-05-07 NOTE — Patient Instructions (Signed)
INR above [place] Do not take Coumadin today or tomorrow and take 5 mg (pink tablet) on Saturday,  then decrease to 10mg  (white tablet) Daily except 5 mg (pink tablet) on Monday, Wednesday and Friday Recheck INR on 05/14/14; Lab at 11:45am, Coumadin clinic at 12:00 pm, and 12:15pm with Dr. Benay Spice

## 2014-05-14 ENCOUNTER — Telehealth: Payer: Self-pay | Admitting: Oncology

## 2014-05-14 ENCOUNTER — Other Ambulatory Visit: Payer: Medicare Other

## 2014-05-14 ENCOUNTER — Ambulatory Visit: Payer: Medicare Other | Admitting: Oncology

## 2014-05-14 ENCOUNTER — Telehealth: Payer: Self-pay | Admitting: *Deleted

## 2014-05-14 ENCOUNTER — Ambulatory Visit: Payer: Medicare Other

## 2014-05-14 NOTE — Telephone Encounter (Signed)
Pt called states she took a pain pill and had to r/s, pt confirmed labs/ov and sent pt to Coumadin Clinic vm.... KJ

## 2014-05-14 NOTE — Telephone Encounter (Signed)
PT. TOOK A PAIN PILL THIS MORNING WHICH MAKES HER DROWSY. SHE WILL NOT BE ABLE TO COME TO HER APPOINTMENT TODAY. THIS INFORMATION WAS GIVEN TO THE PHARMACY.

## 2014-05-15 ENCOUNTER — Other Ambulatory Visit (HOSPITAL_BASED_OUTPATIENT_CLINIC_OR_DEPARTMENT_OTHER): Payer: Medicare Other

## 2014-05-15 ENCOUNTER — Ambulatory Visit (HOSPITAL_BASED_OUTPATIENT_CLINIC_OR_DEPARTMENT_OTHER): Payer: Medicare Other | Admitting: Pharmacist

## 2014-05-15 DIAGNOSIS — I824Y3 Acute embolism and thrombosis of unspecified deep veins of proximal lower extremity, bilateral: Secondary | ICD-10-CM

## 2014-05-15 DIAGNOSIS — I82403 Acute embolism and thrombosis of unspecified deep veins of lower extremity, bilateral: Secondary | ICD-10-CM

## 2014-05-15 LAB — POCT INR: INR: 2.6

## 2014-05-15 LAB — PROTIME-INR
INR: 2.6 (ref 2.00–3.50)
Protime: 31.2 Seconds — ABNORMAL HIGH (ref 10.6–13.4)

## 2014-05-15 NOTE — Progress Notes (Signed)
INR = 2.6 after holding x 2 doses last week then 5 mg on 2/20 then 10 mg/day except 5 mg MWF. She started a 5 day course of Doxycycline by urology on 2/20.  She will complete the abx tomorrow. No complaints re: anticoag. I will change her Coumadin dose to 5 mg daily except 10 mg on Tu/Th/Saturday for total of 50 mg/week to keep approx near the dose she's received in the past 7 days. Return in ~10 days. Kennith Center, Pharm.D., CPP 05/15/2014@12 :21 PM

## 2014-05-20 ENCOUNTER — Telehealth: Payer: Self-pay | Admitting: Pharmacist

## 2014-05-20 NOTE — Telephone Encounter (Signed)
Pt called [facility] to inform us that she started having hematuria yesterday.  She states it was "a lot" but passed no clots.  The bleeding has lessened today but still having some hematuria. S/p R renal stent placement 04/26/14 She called MD (?urology) about this earlier today & was advised to call our clinic. Pt was last seen 05/15/14.  INR = 2.6.  She was not having urinary sxs at that visit. She states she is following dosing instructions given at that appt; taking Coumadin 5 mg daily except 10 mg on Tuesday/Thursday/Saturday. When I asked her if she is done w/ Doxycycline, she stated she still has 1 more to take.  She should have completed this 5 day course on 05/14/14 but she says she will take the last dose tonight (05/20/14).  She obviously has been non-compliant w/ the abx; doubtful she obtained steady state & when I saw her on 2/26, she had been on Doxycycline w/ therapeutic INR.   She also c/o pain w/ having BM.  She has most pain on R side.  Pt states she has used 2 enemas since Sunday (2/28). No other bleeding/bruising.  No dysuria. She cannot come to have her INR checked tomorrow b/c she has to take her mother to the doctor.  The earliest we can see her is Monday 05/25/14. I advised her to stay on same dose of Coumadin & we will move her appt from 3/8 to 3/7.  If she has worsening sxs, she knows to go to the ER. Kennith Center, Pharm.D., CPP 05/20/2014@3 :50 PM

## 2014-05-23 IMAGING — CT CT L SPINE W/ CM
2 of 14 series · 3 of 20 positions shown, 4 images · non-contrast
Comparison: 11/05/2011 MR.

CLINICAL DATA: Right hip and leg pain extending to right abdomen. 
LUMBAR MYELOGRAM AND POST MYELOGRAM CT

MYELOGRAM LUMBAR
TECHNIQUE: Contrast was injected by [REDACTED] at the L2-3 level.
Contrast was negotiated into the lumbar spine without difficulty.
Fluoroscopy time:  0.58 minutes
TECHNIQUE: Multidetector CT imaging of the lumbar spine was
performed following myelography.  Multiplanar CT image
reconstructions were also generated.

[Series 104: st cor · coronal · 0.47mm/px · 1 of 65 slices shown]
[im 33/65  bone]
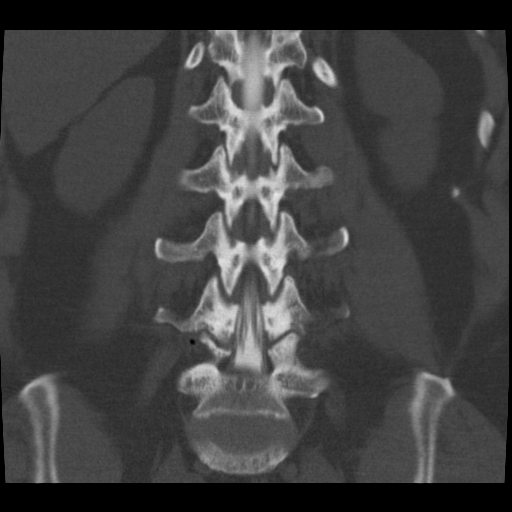

[Series 105: st orthog · axial · 0.47mm/px · z∈[-307,-107]mm · 2 of 104 slices shown, 3 images]
[im 1/104  soft-tissue]
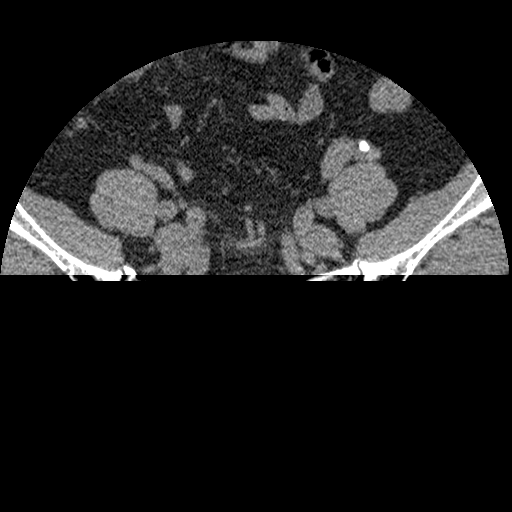
[im 1/104  bone]
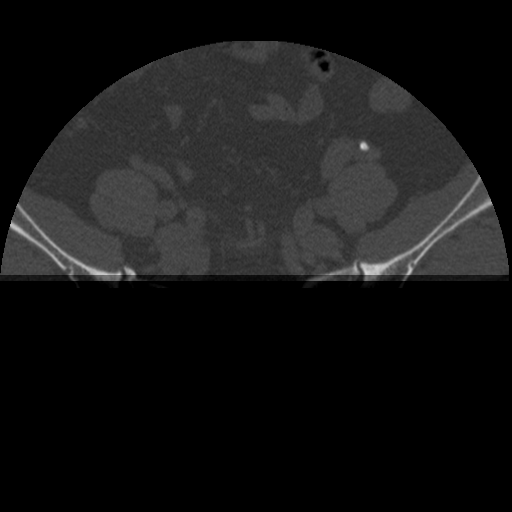
[im 104/104  bone]
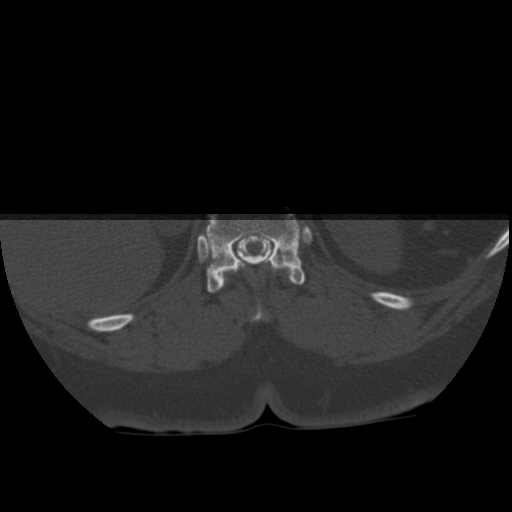

[3 of 20 positions shown; findings below may reference images not displayed]

FINDINGS: No significant nerve root compression, spinal stenosis or
abnormal motion between flexion and extension.  Minimal anterior
slip of L4.
IMPRESSION: Minimal anterior slip of L4.

CT MYELOGRAPHY LUMBAR SPINE
FINDINGS: 2.7 x 2.1 cm right adrenal mass without significant
change compared to the 12/07/2009 MR abdomen exam. This is
incompletely evaluated on present exam. Please see prior MR abdomen
report.

Last fully open disc space is labeled L5-S1.  Present examination
incorporates from T12-L1 through the S2 level.

Conus L1 level.

T12-L1 through L2-3 unremarkable.

L3-4:  Minimal facet joint degenerative changes.

L4-5:  Moderate facet joint degenerative changes greater on the
right with bony overgrowth.  Minimal anterior slip of L4.  Mild
bulge.  No significant spinal stenosis.  Mild bilateral foraminal
narrowing.

L5-S1:  Mild facet joint degenerative changes.  Very mild bulge.
Mild bilateral foraminal narrowing greater on the right.
IMPRESSION: L4-5 moderate facet joint degenerative changes greater on the right
with bony overgrowth.  Minimal anterior slip of L4.  Mild bulge.
No significant spinal stenosis.  Mild bilateral foraminal
narrowing.

L5-S1 mild facet joint degenerative changes.  Very mild bulge.
Mild bilateral foraminal narrowing greater on the right.

L3-4 minimal facet joint degenerative changes.

2.7 x 2.1 cm right adrenal mass without significant change compared
to the 12/07/2009 MR abdomen exam.

## 2014-05-23 IMAGING — RF DG MYELOGRAM LUMBAR
14 of 24 series · 14 of 24 positions shown · non-contrast
Comparison: 11/05/2011 MR.

CLINICAL DATA: Right hip and leg pain extending to right abdomen. 
LUMBAR MYELOGRAM AND POST MYELOGRAM CT

MYELOGRAM LUMBAR
TECHNIQUE: Contrast was injected by [REDACTED] at the L2-3 level.
Contrast was negotiated into the lumbar spine without difficulty.
Fluoroscopy time:  0.58 minutes
TECHNIQUE: Multidetector CT imaging of the lumbar spine was
performed following myelography.  Multiplanar CT image
reconstructions were also generated.

[Series 1: run · 1 of 1 slices shown (1 of 14)]
[im 1/1]
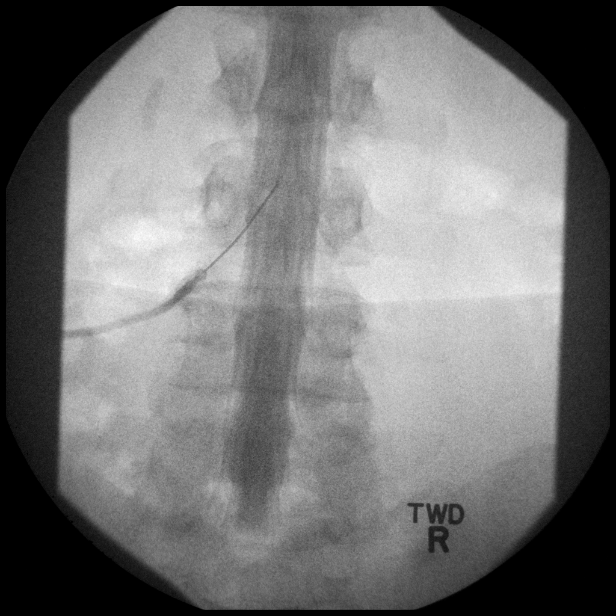

[Series 2: run · 1 of 1 slices shown (2 of 14)]
[im 1/1]
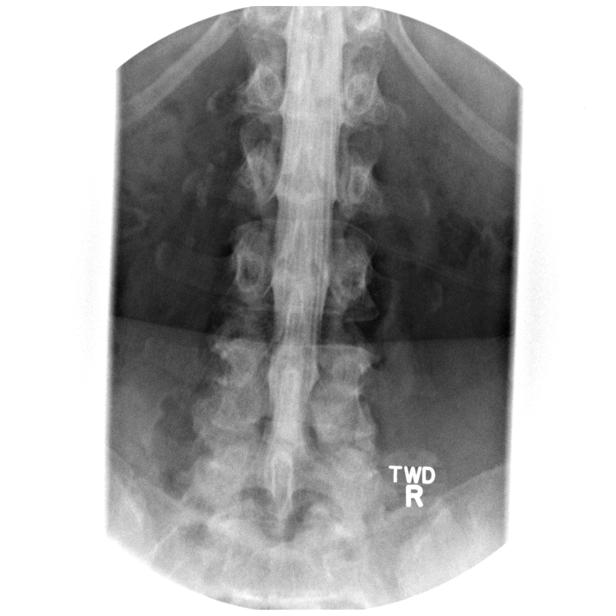

[Series 3: run · 1 of 1 slices shown (3 of 14)]
[im 1/1]
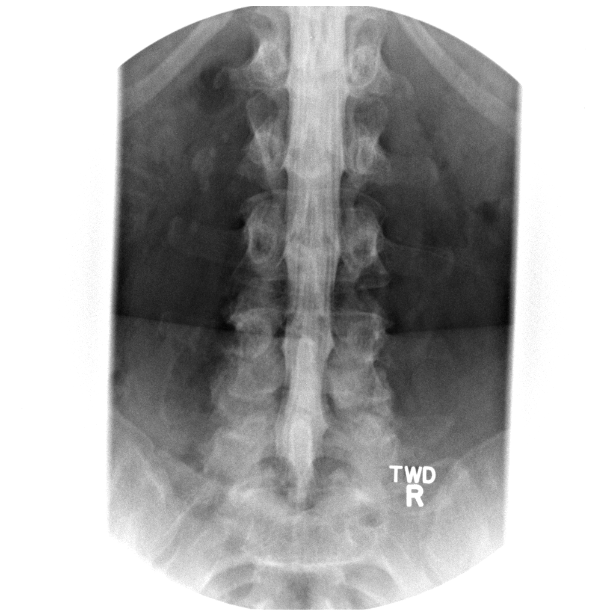

[Series 4: run · 1 of 1 slices shown (4 of 14)]
[im 1/1]
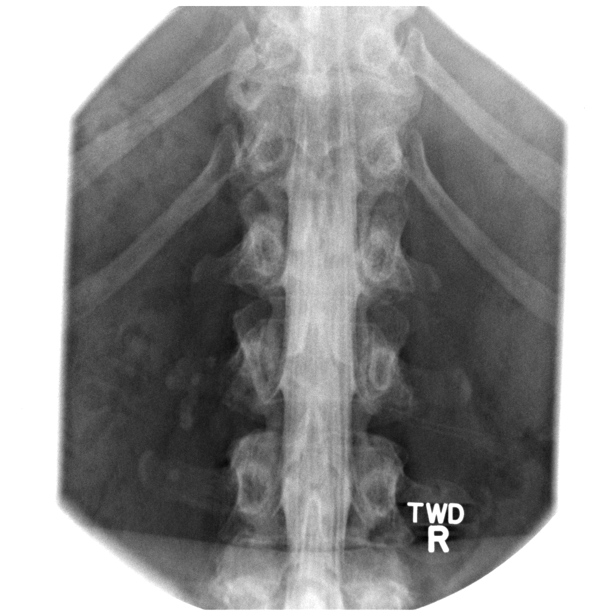

[Series 4: run · 1 of 1 slices shown (5 of 14)]
[im 1/1]
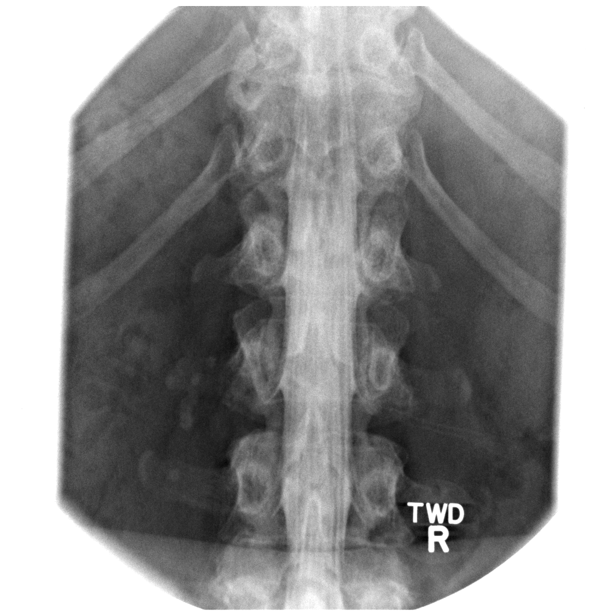

[Series 5: run · 1 of 1 slices shown (6 of 14)]
[im 1/1]
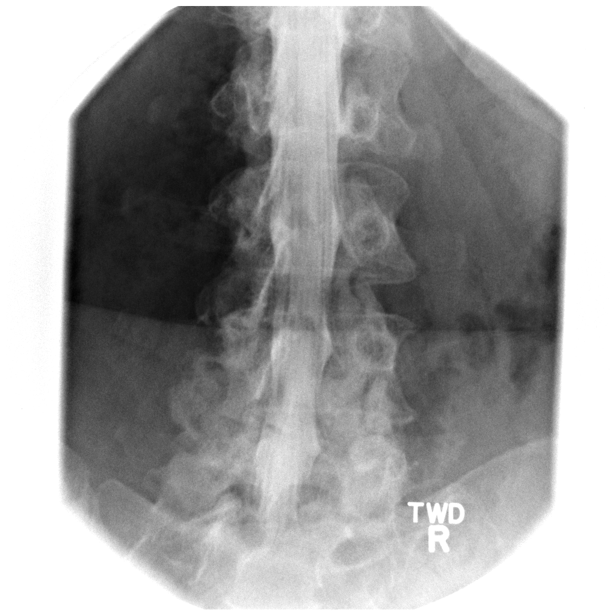

[Series 6: run · 1 of 1 slices shown (7 of 14)]
[im 1/1]
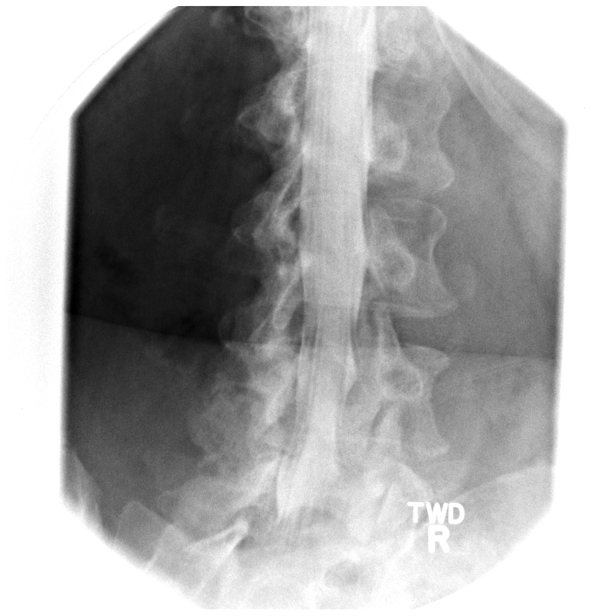

[Series 7: run · 1 of 1 slices shown (8 of 14)]
[im 1/1]
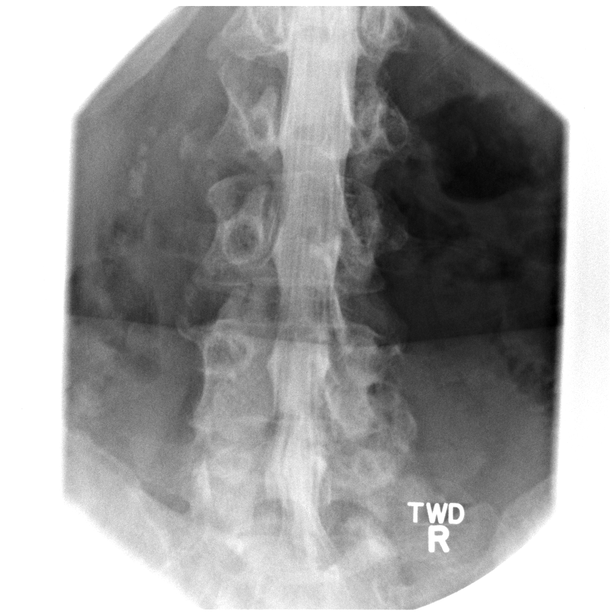

[Series 8: run · 1 of 1 slices shown (9 of 14)]
[im 1/1]
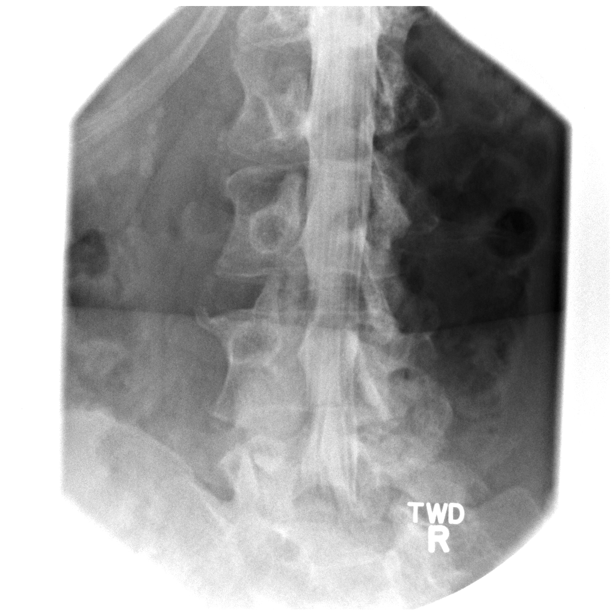

[Series 9: run · 1 of 1 slices shown (10 of 14)]
[im 1/1]
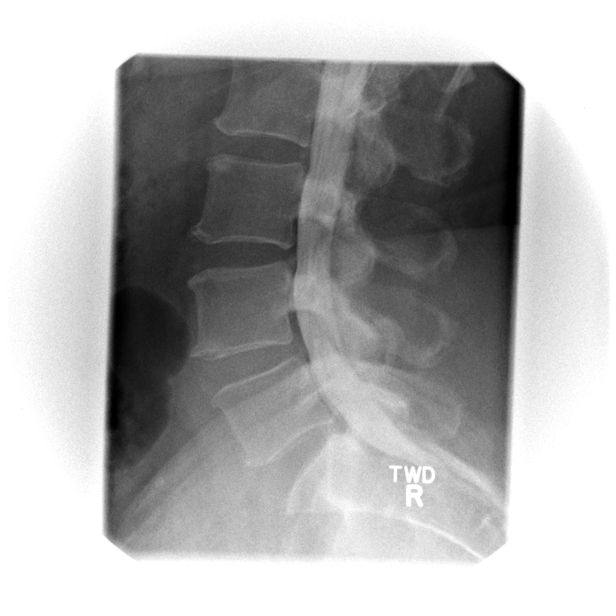

[Series 10: run · 1 of 1 slices shown (11 of 14)]
[im 1/1]
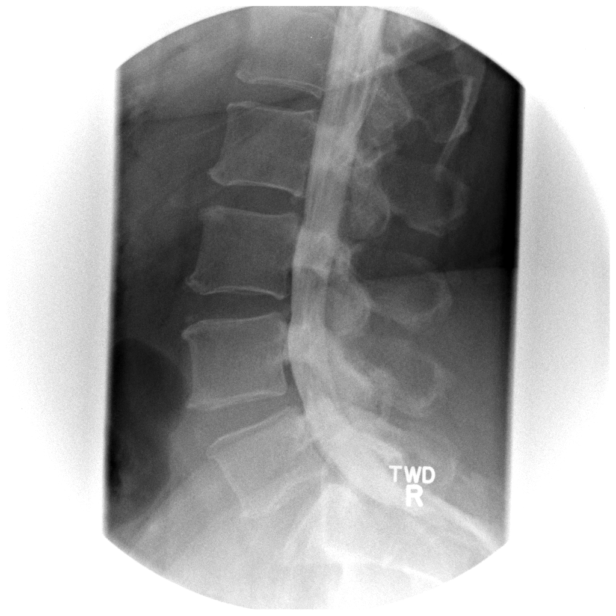

[Series 10: run · 1 of 1 slices shown (12 of 14)]
[im 1/1]
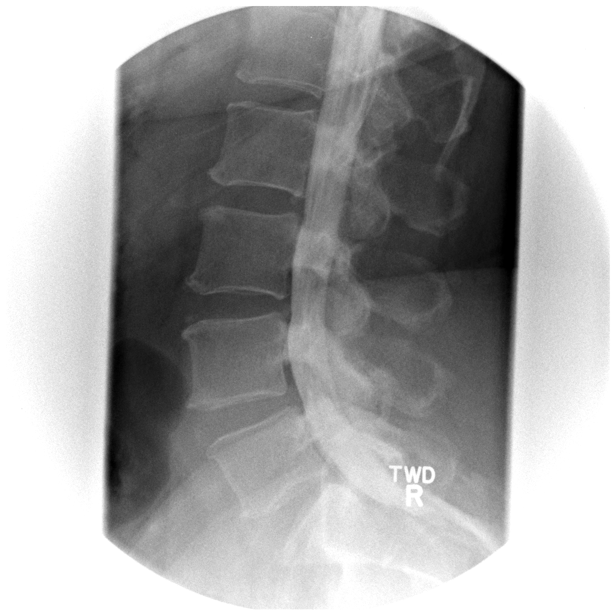

[Series 11: run · 1 of 1 slices shown (13 of 14)]
[im 1/1]
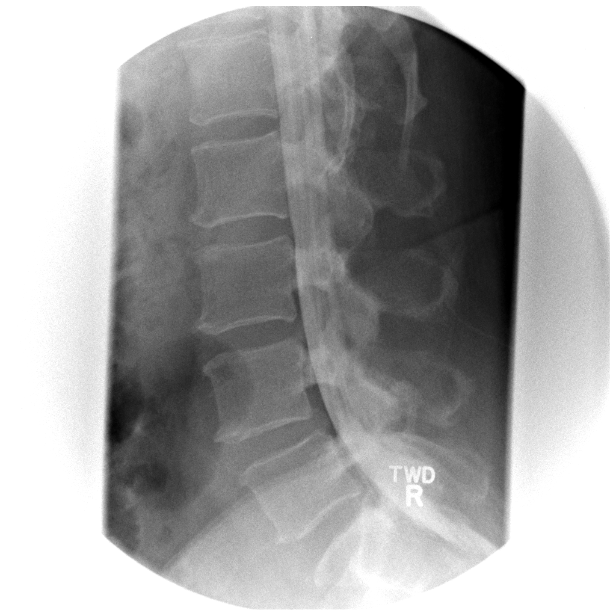

[Series 12: run · 1 of 1 slices shown (14 of 14)]
[im 1/1]
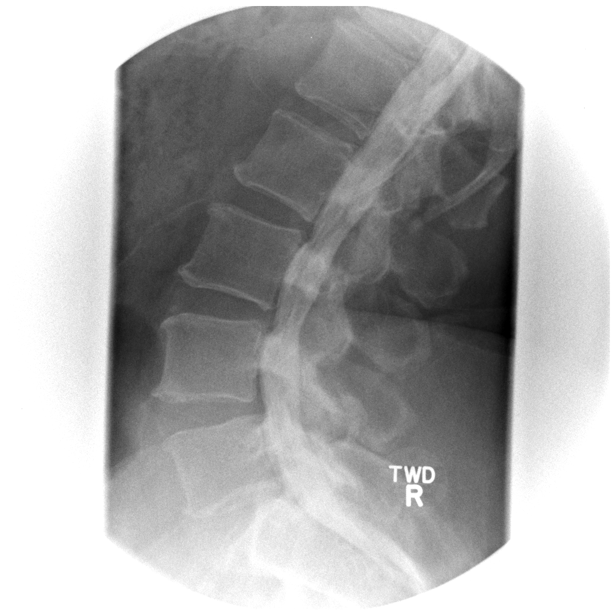

[14 of 24 positions shown; findings below may reference images not displayed]

FINDINGS: No significant nerve root compression, spinal stenosis or
abnormal motion between flexion and extension.  Minimal anterior
slip of L4.
IMPRESSION: Minimal anterior slip of L4.

CT MYELOGRAPHY LUMBAR SPINE
FINDINGS: 2.7 x 2.1 cm right adrenal mass without significant
change compared to the 12/07/2009 MR abdomen exam. This is
incompletely evaluated on present exam. Please see prior MR abdomen
report.

Last fully open disc space is labeled L5-S1.  Present examination
incorporates from T12-L1 through the S2 level.

Conus L1 level.

T12-L1 through L2-3 unremarkable.

L3-4:  Minimal facet joint degenerative changes.

L4-5:  Moderate facet joint degenerative changes greater on the
right with bony overgrowth.  Minimal anterior slip of L4.  Mild
bulge.  No significant spinal stenosis.  Mild bilateral foraminal
narrowing.

L5-S1:  Mild facet joint degenerative changes.  Very mild bulge.
Mild bilateral foraminal narrowing greater on the right.
IMPRESSION: L4-5 moderate facet joint degenerative changes greater on the right
with bony overgrowth.  Minimal anterior slip of L4.  Mild bulge.
No significant spinal stenosis.  Mild bilateral foraminal
narrowing.

L5-S1 mild facet joint degenerative changes.  Very mild bulge.
Mild bilateral foraminal narrowing greater on the right.

L3-4 minimal facet joint degenerative changes.

2.7 x 2.1 cm right adrenal mass without significant change compared
to the 12/07/2009 MR abdomen exam.

## 2014-05-25 ENCOUNTER — Ambulatory Visit (HOSPITAL_BASED_OUTPATIENT_CLINIC_OR_DEPARTMENT_OTHER): Payer: Medicare Other | Admitting: Pharmacist

## 2014-05-25 ENCOUNTER — Other Ambulatory Visit (HOSPITAL_BASED_OUTPATIENT_CLINIC_OR_DEPARTMENT_OTHER): Payer: Medicare Other

## 2014-05-25 DIAGNOSIS — I82403 Acute embolism and thrombosis of unspecified deep veins of lower extremity, bilateral: Secondary | ICD-10-CM

## 2014-05-25 DIAGNOSIS — I824Y3 Acute embolism and thrombosis of unspecified deep veins of proximal lower extremity, bilateral: Secondary | ICD-10-CM

## 2014-05-25 LAB — PROTIME-INR
INR: 1.7 — ABNORMAL LOW (ref 2.00–3.50)
Protime: 20.4 Seconds — ABNORMAL HIGH (ref 10.6–13.4)

## 2014-05-25 LAB — POCT INR: INR: 1.7

## 2014-05-25 NOTE — Patient Instructions (Signed)
INR slightly below [place] Take 10 mg today only then continue to take Coumadin 5 mg daily except 10 mg on Tuesday/Thursday/Saturday Recheck INR on 06/05/14; Lab at 12pm, Coumadin clinic at 12:15 pm

## 2014-05-25 NOTE — Progress Notes (Signed)
INR slightly below [place] today at 1.7 Pt is doing well with no complaints She states bleeding has stopped since she called [place] last week with hematuria. No bruising to report No missed doses or extra doses No diet or medication changes Pt states she has an appointment with Dr. Tresa Moore at [facility] on 3/21 to have her stent removed and may need to be off of coumadin I called [facility] and spoke to Triage RN, who stated that stent removal on 3/21 is unlikely as this is just a follow up visit. I left message for Dr. Tresa Moore to call and let us know if he plans on removing stent on 3/21 and any anticoagulation instructions he may have. He may be comfortable with INR < 2.  For now patient will return on Friday [birth year] (pt birthday) to check INR prior to visit on 3/21 with Dr. Tresa Moore  Plan:  Take 10 mg today only then continue to take Coumadin 5 mg daily except 10 mg on Tuesday/Thursday/Saturday Recheck INR on 06/05/14; Lab at 12pm, Coumadin clinic at 12:15 pm

## 2014-05-26 ENCOUNTER — Other Ambulatory Visit: Payer: Medicare Other

## 2014-05-26 ENCOUNTER — Ambulatory Visit: Payer: Medicare Other

## 2014-06-01 ENCOUNTER — Telehealth: Payer: Self-pay | Admitting: Pharmacist

## 2014-06-01 NOTE — Telephone Encounter (Signed)
Ms. Lubinski called this AM stating that her appointment with Urology for possible stent removal has been moved to this week on Thursday 3/17. Ms. Boghosian states she does not need to stop her coumadin per their instructions but would like to have her INR checked prior to her visit.   Pt rescheduled to Wednesday 06/03/14 at 10:30am for lab and 10:45am for coumadin clinic. Message to scheduling  Thank you,  Montel Clock, PharmD, BCOP

## 2014-06-03 ENCOUNTER — Other Ambulatory Visit (HOSPITAL_BASED_OUTPATIENT_CLINIC_OR_DEPARTMENT_OTHER): Payer: Medicare Other

## 2014-06-03 ENCOUNTER — Other Ambulatory Visit (HOSPITAL_COMMUNITY)
Admission: RE | Admit: 2014-06-03 | Discharge: 2014-06-03 | Disposition: A | Discharge status: Home / Self Care | Payer: Medicare Other | Source: Ambulatory Visit | Attending: Oncology | Admitting: Oncology

## 2014-06-03 ENCOUNTER — Ambulatory Visit (HOSPITAL_BASED_OUTPATIENT_CLINIC_OR_DEPARTMENT_OTHER): Payer: Medicare Other | Admitting: Pharmacist

## 2014-06-03 DIAGNOSIS — I82409 Acute embolism and thrombosis of unspecified deep veins of unspecified lower extremity: Secondary | ICD-10-CM | POA: Insufficient documentation

## 2014-06-03 DIAGNOSIS — I82403 Acute embolism and thrombosis of unspecified deep veins of lower extremity, bilateral: Secondary | ICD-10-CM | POA: Diagnosis not present

## 2014-06-03 LAB — PROTIME-INR
INR: 3.62 — ABNORMAL HIGH (ref 0.00–1.49)
Prothrombin Time: 36.3 seconds — ABNORMAL HIGH (ref 11.6–15.2)

## 2014-06-03 LAB — POCT INR: INR: 3.62

## 2014-06-03 NOTE — Progress Notes (Signed)
INR=3.62.  Ms [name] is still c/o hematuria.  She has started taking a new medication for pain but could not remember name.  Ms [name] has appt with Dr. Tresa Moore at [facility] with plans to pull renal stent.  I have spoken to Dr. Tresa Moore RN and will hold coumadin today.  Ms [name] will resume coumadin at decreased dose of 10mg  T/Th and 5mg  other days.  Will check PT/INR with next MD appt on 06/22/14.  Ms [name] knows to call with any issues prior to then.

## 2014-06-05 ENCOUNTER — Ambulatory Visit: Payer: Medicare Other

## 2014-06-05 ENCOUNTER — Other Ambulatory Visit: Payer: Medicare Other

## 2014-06-12 ENCOUNTER — Encounter (HOSPITAL_COMMUNITY): Payer: Self-pay | Admitting: Emergency Medicine

## 2014-06-12 ENCOUNTER — Emergency Department (HOSPITAL_COMMUNITY): Payer: Medicare Other

## 2014-06-12 ENCOUNTER — Observation Stay (HOSPITAL_COMMUNITY)
Admission: EM | Admit: 2014-06-12 | Discharge: 2014-06-14 | Disposition: A | Discharge status: Home / Self Care | Payer: Medicare Other | Attending: Internal Medicine | Admitting: Internal Medicine

## 2014-06-12 ENCOUNTER — Observation Stay (HOSPITAL_COMMUNITY): Payer: Medicare Other

## 2014-06-12 DIAGNOSIS — I10 Essential (primary) hypertension: Secondary | ICD-10-CM | POA: Diagnosis not present

## 2014-06-12 DIAGNOSIS — Z96653 Presence of artificial knee joint, bilateral: Secondary | ICD-10-CM | POA: Diagnosis not present

## 2014-06-12 DIAGNOSIS — R112 Nausea with vomiting, unspecified: Secondary | ICD-10-CM | POA: Diagnosis not present

## 2014-06-12 DIAGNOSIS — J45909 Unspecified asthma, uncomplicated: Secondary | ICD-10-CM | POA: Insufficient documentation

## 2014-06-12 DIAGNOSIS — M797 Fibromyalgia: Secondary | ICD-10-CM | POA: Diagnosis not present

## 2014-06-12 DIAGNOSIS — M329 Systemic lupus erythematosus, unspecified: Secondary | ICD-10-CM | POA: Insufficient documentation

## 2014-06-12 DIAGNOSIS — E1165 Type 2 diabetes mellitus with hyperglycemia: Secondary | ICD-10-CM | POA: Insufficient documentation

## 2014-06-12 DIAGNOSIS — G4730 Sleep apnea, unspecified: Secondary | ICD-10-CM | POA: Diagnosis not present

## 2014-06-12 DIAGNOSIS — R208 Other disturbances of skin sensation: Secondary | ICD-10-CM

## 2014-06-12 DIAGNOSIS — Z9981 Dependence on supplemental oxygen: Secondary | ICD-10-CM | POA: Diagnosis not present

## 2014-06-12 DIAGNOSIS — F1490 Cocaine use, unspecified, uncomplicated: Secondary | ICD-10-CM | POA: Diagnosis not present

## 2014-06-12 DIAGNOSIS — Z7901 Long term (current) use of anticoagulants: Secondary | ICD-10-CM | POA: Diagnosis not present

## 2014-06-12 DIAGNOSIS — E119 Type 2 diabetes mellitus without complications: Secondary | ICD-10-CM | POA: Diagnosis present

## 2014-06-12 DIAGNOSIS — R531 Weakness: Secondary | ICD-10-CM

## 2014-06-12 DIAGNOSIS — Z90710 Acquired absence of both cervix and uterus: Secondary | ICD-10-CM | POA: Insufficient documentation

## 2014-06-12 DIAGNOSIS — F329 Major depressive disorder, single episode, unspecified: Secondary | ICD-10-CM | POA: Diagnosis not present

## 2014-06-12 DIAGNOSIS — R197 Diarrhea, unspecified: Secondary | ICD-10-CM | POA: Diagnosis not present

## 2014-06-12 DIAGNOSIS — R945 Abnormal results of liver function studies: Secondary | ICD-10-CM

## 2014-06-12 DIAGNOSIS — R200 Anesthesia of skin: Secondary | ICD-10-CM | POA: Diagnosis not present

## 2014-06-12 DIAGNOSIS — R7989 Other specified abnormal findings of blood chemistry: Secondary | ICD-10-CM | POA: Diagnosis not present

## 2014-06-12 DIAGNOSIS — K219 Gastro-esophageal reflux disease without esophagitis: Secondary | ICD-10-CM | POA: Diagnosis not present

## 2014-06-12 DIAGNOSIS — Z86718 Personal history of other venous thrombosis and embolism: Secondary | ICD-10-CM | POA: Insufficient documentation

## 2014-06-12 DIAGNOSIS — R202 Paresthesia of skin: Secondary | ICD-10-CM | POA: Diagnosis not present

## 2014-06-12 DIAGNOSIS — R209 Unspecified disturbances of skin sensation: Secondary | ICD-10-CM | POA: Diagnosis not present

## 2014-06-12 DIAGNOSIS — Z87891 Personal history of nicotine dependence: Secondary | ICD-10-CM | POA: Diagnosis not present

## 2014-06-12 DIAGNOSIS — D689 Coagulation defect, unspecified: Secondary | ICD-10-CM | POA: Diagnosis not present

## 2014-06-12 DIAGNOSIS — E876 Hypokalemia: Secondary | ICD-10-CM | POA: Diagnosis not present

## 2014-06-12 DIAGNOSIS — J449 Chronic obstructive pulmonary disease, unspecified: Secondary | ICD-10-CM | POA: Insufficient documentation

## 2014-06-12 DIAGNOSIS — Z888 Allergy status to other drugs, medicaments and biological substances status: Secondary | ICD-10-CM | POA: Insufficient documentation

## 2014-06-12 DIAGNOSIS — Z981 Arthrodesis status: Secondary | ICD-10-CM | POA: Diagnosis not present

## 2014-06-12 DIAGNOSIS — Z9851 Tubal ligation status: Secondary | ICD-10-CM | POA: Insufficient documentation

## 2014-06-12 DIAGNOSIS — IMO0002 Reserved for concepts with insufficient information to code with codable children: Secondary | ICD-10-CM | POA: Diagnosis present

## 2014-06-12 LAB — RAPID URINE DRUG SCREEN, HOSP PERFORMED
Amphetamines: NOT DETECTED
Barbiturates: NOT DETECTED
Benzodiazepines: NOT DETECTED
Cocaine: NOT DETECTED
Opiates: NOT DETECTED
Tetrahydrocannabinol: NOT DETECTED

## 2014-06-12 LAB — CBC WITH DIFFERENTIAL/PLATELET
Basophils Absolute: 0 10*3/uL (ref 0.0–0.1)
Basophils Relative: 0 % (ref 0–1)
Eosinophils Absolute: 0.1 10*3/uL (ref 0.0–0.7)
Eosinophils Relative: 2 % (ref 0–5)
HCT: 33.8 % — ABNORMAL LOW (ref 36.0–46.0)
Hemoglobin: 10.8 g/dL — ABNORMAL LOW (ref 12.0–15.0)
Lymphocytes Relative: 29 % (ref 12–46)
Lymphs Abs: 1.6 10*3/uL (ref 0.7–4.0)
MCH: 26.5 pg (ref 26.0–34.0)
MCHC: 32 g/dL (ref 30.0–36.0)
MCV: 82.8 fL (ref 78.0–100.0)
Monocytes Absolute: 0.3 10*3/uL (ref 0.1–1.0)
Monocytes Relative: 6 % (ref 3–12)
Neutro Abs: 3.5 10*3/uL (ref 1.7–7.7)
Neutrophils Relative %: 63 % (ref 43–77)
Platelets: 263 10*3/uL (ref 150–400)
RBC: 4.08 MIL/uL (ref 3.87–5.11)
RDW: 17.6 % — ABNORMAL HIGH (ref 11.5–15.5)
WBC: 5.5 10*3/uL (ref 4.0–10.5)

## 2014-06-12 LAB — PROTIME-INR
INR: 4.8 — ABNORMAL HIGH (ref 0.00–1.49)
INR: 4.39 — ABNORMAL HIGH (ref 0.00–1.49)
Prothrombin Time: 45.3 seconds — ABNORMAL HIGH (ref 11.6–15.2)
Prothrombin Time: 42.2 seconds — ABNORMAL HIGH (ref 11.6–15.2)

## 2014-06-12 LAB — COMPREHENSIVE METABOLIC PANEL
ALT: 41 U/L — ABNORMAL HIGH (ref 0–35)
AST: 39 U/L — ABNORMAL HIGH (ref 0–37)
Albumin: 3.6 g/dL (ref 3.5–5.2)
Alkaline Phosphatase: 87 U/L (ref 39–117)
Anion gap: 15 (ref 5–15)
BUN: 10 mg/dL (ref 6–23)
CO2: 28 mmol/L (ref 19–32)
Calcium: 7.7 mg/dL — ABNORMAL LOW (ref 8.4–10.5)
Chloride: 94 mmol/L — ABNORMAL LOW (ref 96–112)
Creatinine, Ser: 0.96 mg/dL (ref 0.50–1.10)
GFR calc Af Amer: 74 mL/min — ABNORMAL LOW (ref >90)
GFR calc non Af Amer: 63 mL/min — ABNORMAL LOW (ref >90)
Glucose, Bld: 219 mg/dL — ABNORMAL HIGH (ref 70–99)
Potassium: 2.4 mmol/L — CL (ref 3.5–5.1)
Sodium: 137 mmol/L (ref 135–145)
Total Bilirubin: 0.4 mg/dL (ref 0.3–1.2)
Total Protein: 7.1 g/dL (ref 6.0–8.3)

## 2014-06-12 LAB — URINE MICROSCOPIC-ADD ON

## 2014-06-12 LAB — URINALYSIS, ROUTINE W REFLEX MICROSCOPIC
Bilirubin Urine: NEGATIVE
Glucose, UA: NEGATIVE mg/dL
Ketones, ur: NEGATIVE mg/dL
Leukocytes, UA: NEGATIVE
Nitrite: NEGATIVE
Protein, ur: NEGATIVE mg/dL
Specific Gravity, Urine: 1.019 (ref 1.005–1.030)
Urobilinogen, UA: 0.2 mg/dL (ref 0.0–1.0)
pH: 6 (ref 5.0–8.0)

## 2014-06-12 LAB — ETHANOL: Alcohol, Ethyl (B): <5 mg/dL (ref 0–9)

## 2014-06-12 LAB — GLUCOSE, CAPILLARY: Glucose-Capillary: 189 mg/dL — ABNORMAL HIGH (ref 70–99)

## 2014-06-12 LAB — MAGNESIUM: Magnesium: 1 mg/dL — ABNORMAL LOW (ref 1.5–2.5)

## 2014-06-12 LAB — APTT: aPTT: 132 seconds — ABNORMAL HIGH (ref 24–37)

## 2014-06-12 LAB — PHOSPHORUS: Phosphorus: 2.7 mg/dL (ref 2.3–4.6)

## 2014-06-12 LAB — CBG MONITORING, ED: Glucose-Capillary: 183 mg/dL — ABNORMAL HIGH (ref 70–99)

## 2014-06-12 MED ORDER — POTASSIUM CHLORIDE IN NACL 40-0.9 MEQ/L-% IV SOLN
INTRAVENOUS | Status: AC
  Administered 2014-06-12: 50 mL/h via INTRAVENOUS
  Filled 2014-06-12 (×2): qty 1000

## 2014-06-12 MED ORDER — OXYCODONE-ACETAMINOPHEN 5-325 MG PO TABS
1.0000 | ORAL_TABLET | Freq: Four times a day (QID) | ORAL | Status: DC | PRN
Start: 2014-06-12 — End: 2014-06-14
  Administered 2014-06-12 – 2014-06-14 (×3): 1 via ORAL
  Filled 2014-06-12 (×3): qty 1

## 2014-06-12 MED ORDER — LORATADINE 10 MG PO TABS: 10.0000 mg | ORAL_TABLET | Freq: Every day | ORAL | Status: DC | PRN

## 2014-06-12 MED ORDER — POTASSIUM CHLORIDE 10 MEQ/100ML IV SOLN
10.0000 meq | INTRAVENOUS | Status: AC
  Administered 2014-06-12: 10 meq via INTRAVENOUS
  Filled 2014-06-12: qty 100

## 2014-06-12 MED ORDER — PANTOPRAZOLE SODIUM 40 MG PO TBEC
40.0000 mg | DELAYED_RELEASE_TABLET | Freq: Every day | ORAL | Status: DC
  Administered 2014-06-13 – 2014-06-14 (×2): 40 mg via ORAL
  Filled 2014-06-12 (×2): qty 1

## 2014-06-12 MED ORDER — MAGNESIUM SULFATE IN D5W 10-5 MG/ML-% IV SOLN
1.0000 g | Freq: Once | INTRAVENOUS | Status: AC
  Administered 2014-06-12: 1 g via INTRAVENOUS
  Filled 2014-06-12: qty 100

## 2014-06-12 MED ORDER — DULOXETINE HCL 60 MG PO CPEP
60.0000 mg | ORAL_CAPSULE | Freq: Every day | ORAL | Status: DC
  Administered 2014-06-13 – 2014-06-14 (×2): 60 mg via ORAL
  Filled 2014-06-12 (×3): qty 1

## 2014-06-12 MED ORDER — DILTIAZEM HCL ER COATED BEADS 180 MG PO CP24
180.0000 mg | ORAL_CAPSULE | Freq: Every day | ORAL | Status: DC
  Administered 2014-06-13 – 2014-06-14 (×2): 180 mg via ORAL
  Filled 2014-06-12 (×3): qty 1

## 2014-06-12 MED ORDER — METFORMIN HCL 500 MG PO TABS
500.0000 mg | ORAL_TABLET | Freq: Every day | ORAL | Status: DC
  Administered 2014-06-12: 500 mg via ORAL
  Filled 2014-06-12: qty 1

## 2014-06-12 MED ORDER — SODIUM CHLORIDE 0.9 % IJ SOLN
3.0000 mL | Freq: Two times a day (BID) | INTRAMUSCULAR | Status: DC
  Administered 2014-06-12 – 2014-06-13 (×3): 3 mL via INTRAVENOUS

## 2014-06-12 MED ORDER — ACETAMINOPHEN 325 MG PO TABS: 650.0000 mg | ORAL_TABLET | Freq: Four times a day (QID) | ORAL | Status: DC | PRN

## 2014-06-12 MED ORDER — QUETIAPINE FUMARATE 50 MG PO TABS
50.0000 mg | ORAL_TABLET | Freq: Two times a day (BID) | ORAL | Status: DC
  Administered 2014-06-12 – 2014-06-14 (×4): 50 mg via ORAL
  Filled 2014-06-12 (×4): qty 1

## 2014-06-12 MED ORDER — CYCLOBENZAPRINE HCL 10 MG PO TABS: 10.0000 mg | ORAL_TABLET | Freq: Two times a day (BID) | ORAL | Status: DC | PRN

## 2014-06-12 MED ORDER — POTASSIUM CHLORIDE CRYS ER 20 MEQ PO TBCR
40.0000 meq | EXTENDED_RELEASE_TABLET | Freq: Once | ORAL | Status: AC
  Administered 2014-06-12: 40 meq via ORAL
  Filled 2014-06-12: qty 2

## 2014-06-12 MED ORDER — COLCHICINE 0.6 MG PO TABS
0.6000 mg | ORAL_TABLET | Freq: Every day | ORAL | Status: DC
  Administered 2014-06-13 – 2014-06-14 (×2): 0.6 mg via ORAL
  Filled 2014-06-12 (×2): qty 1

## 2014-06-12 MED ORDER — GABAPENTIN 300 MG PO CAPS
300.0000 mg | ORAL_CAPSULE | Freq: Three times a day (TID) | ORAL | Status: DC
  Administered 2014-06-12 – 2014-06-14 (×5): 300 mg via ORAL
  Filled 2014-06-12 (×5): qty 1

## 2014-06-12 MED ORDER — HYDROCHLOROTHIAZIDE 12.5 MG PO CAPS
12.5000 mg | ORAL_CAPSULE | Freq: Every day | ORAL | Status: DC
  Administered 2014-06-12: 12.5 mg via ORAL
  Filled 2014-06-12: qty 1

## 2014-06-12 MED ORDER — SIMVASTATIN 20 MG PO TABS
20.0000 mg | ORAL_TABLET | Freq: Every day | ORAL | Status: DC
  Administered 2014-06-12 – 2014-06-13 (×2): 20 mg via ORAL
  Filled 2014-06-12 (×2): qty 1

## 2014-06-12 MED ORDER — INSULIN ASPART 100 UNIT/ML ~~LOC~~ SOLN
0.0000 [IU] | Freq: Three times a day (TID) | SUBCUTANEOUS | Status: DC
  Administered 2014-06-13: 1 [IU] via SUBCUTANEOUS
  Administered 2014-06-13: 2 [IU] via SUBCUTANEOUS
  Administered 2014-06-13: 1 [IU] via SUBCUTANEOUS
  Administered 2014-06-14 (×2): 2 [IU] via SUBCUTANEOUS

## 2014-06-12 MED ORDER — INSULIN ASPART 100 UNIT/ML ~~LOC~~ SOLN: 0.0000 [IU] | Freq: Every day | SUBCUTANEOUS | Status: DC

## 2014-06-12 MED ORDER — ALBUTEROL SULFATE (2.5 MG/3ML) 0.083% IN NEBU: 2.5000 mg | INHALATION_SOLUTION | Freq: Four times a day (QID) | RESPIRATORY_TRACT | Status: DC | PRN

## 2014-06-12 MED ORDER — POLYETHYLENE GLYCOL 3350 17 G PO PACK
17.0000 g | PACK | Freq: Every day | ORAL | Status: DC
  Filled 2014-06-12 (×2): qty 1

## 2014-06-12 MED ORDER — CLONIDINE HCL 0.1 MG PO TABS
0.1000 mg | ORAL_TABLET | Freq: Two times a day (BID) | ORAL | Status: DC
  Administered 2014-06-12 – 2014-06-14 (×4): 0.1 mg via ORAL
  Filled 2014-06-12 (×4): qty 1

## 2014-06-12 MED ORDER — AMLODIPINE BESYLATE 5 MG PO TABS
5.0000 mg | ORAL_TABLET | Freq: Every day | ORAL | Status: DC
  Administered 2014-06-13 – 2014-06-14 (×2): 5 mg via ORAL
  Filled 2014-06-12 (×3): qty 1

## 2014-06-12 MED ORDER — CALCIUM GLUCONATE 10 % IV SOLN
1.0000 g | Freq: Once | INTRAVENOUS | Status: DC
  Filled 2014-06-12: qty 10

## 2014-06-12 MED ORDER — KETOROLAC TROMETHAMINE 10 MG PO TABS
10.0000 mg | ORAL_TABLET | Freq: Three times a day (TID) | ORAL | Status: DC | PRN
  Filled 2014-06-12: qty 1

## 2014-06-12 MED ORDER — POTASSIUM CHLORIDE 10 MEQ/100ML IV SOLN
10.0000 meq | Freq: Once | INTRAVENOUS | Status: AC
  Administered 2014-06-12: 10 meq via INTRAVENOUS
  Filled 2014-06-12: qty 100

## 2014-06-12 MED ORDER — ACETAMINOPHEN 650 MG RE SUPP: 650.0000 mg | Freq: Four times a day (QID) | RECTAL | Status: DC | PRN

## 2014-06-12 NOTE — H&P (Addendum)
[name] is an 59 y.o. female.    [place] (pcp)  Chief Complaint: numbness, tingling HPI: 59 yo female with dm2, htn, lupus anticoagulant, c/o left sided numbness, tingling since yesterday at 12 pm .  + left facial numbness.   Pt denies headache, vision change, dysarthria, dysphagia, cp, palp, sob, lower ext edema, focal weakness.  Pt was seen in ED and MRI brain negative for acute CVA.  Pt will be admitted for w/up tingling, possibly secondar y to hypokalemia, vs tia. Vs hypocalcemia.    Past Medical History  Diagnosis Date  . DVT (deep venous thrombosis)     Recurrent  . Adrenal mass 03/226    Benign  . Lupus Anticoagulant Positive   . Clotting disorder     +beta-2-glycoprotein IgA antibody  . History of cocaine abuse     Remote history   . Heart murmur   . Hypertension     takes meds daily  . Depression   . Shortness of breath     exertion or lying flat  . 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 Sleep apnea     2l of oxygen at night  . On home oxygen therapy     at night  . Diabetes mellitus     120s usually fasting  . GERD (gastroesophageal reflux disease)   . Dizziness, nonspecific   . Arthritis     knees/multiple orthopedic conditons; lower back  . History of blood transfusion   . Fibromyalgia   . Asthma     per pt    Past Surgical History  Procedure Laterality Date  . Carpal tunnel release      Bilateral  . Abdominal hysterectomy  1989    Fibroids  . Back surgery      cervical spine---disk disease  . Cesarean section    . Replacement total knee  11/17/2008    bilateral  . Right elbow surgery    . Tubal ligation    . Gallstones removed    . Anterior lumbar fusion  01/03/2012    Procedure: ANTERIOR LUMBAR FUSION 1 LEVEL;  Surgeon: Winfield Cunas, MD;  Location: [facility];  Service: Neurosurgery;  Laterality: N/A;  Lumbar Four-Five Anterior Lumbar Interbody Fusion with Instrumentation  .  Cystoscopy/retrograde/ureteroscopy/stone extraction with basket Right 04/26/2014    Procedure: CYSTOSCOPY/ RIGHT RETROGRADE/ RIGHT URETEROSCOPY/URETERAL AND RENAL PELVIS BIOPSY;  Surgeon: Alexis Frock, MD;  Location: [facility];  Service: Urology;  Laterality: Right;    Family History  Problem Relation Age of Onset  . Hypertension Father   . Heart failure Father   . Hypertension Mother   . Diabetes Mother   . Cancer Mother     Pancreatic   Social History:  reports that she quit smoking about 11 years ago. She started smoking about 13 years ago. She has never used smokeless tobacco. She reports that she uses illicit drugs (Cocaine). She reports that she does not drink alcohol.  Allergies:  Allergies  Allergen Reactions  . Corticosteroids Rash    States she developed a rash on her tongue after a steroid injection in her back     (Not in a hospital admission)  Results for orders placed or performed during the hospital encounter of 06/12/14 (from the past 48 hour(s))  Comprehensive metabolic panel     Status: Abnormal   Collection Time: 06/12/14 12:31 PM  Result Value Ref Range   Sodium 137  135 - 145 mmol/L   Potassium 2.4 (LL) 3.5 - 5.1 mmol/L    Comment: CRITICAL RESULT CALLED TO, READ BACK BY AND VERIFIED WITH: BRICKER A RN 06/12/14 1331 COSTELLO B REPEATED TO VERIFY    Chloride 94 (L) 96 - 112 mmol/L   CO2 28 19 - 32 mmol/L   Glucose, Bld 219 (H) 70 - 99 mg/dL   BUN 10 6 - 23 mg/dL   Creatinine, Ser 0.96 0.50 - 1.10 mg/dL   Calcium 7.7 (L) 8.4 - 10.5 mg/dL   Total Protein 7.1 6.0 - 8.3 g/dL   Albumin 3.6 3.5 - 5.2 g/dL   AST 39 (H) 0 - 37 U/L   ALT 41 (H) 0 - 35 U/L   Alkaline Phosphatase 87 39 - 117 U/L   Total Bilirubin 0.4 0.3 - 1.2 mg/dL   GFR calc non Af Amer 63 (L) >90 mL/min   GFR calc Af Amer 74 (L) >90 mL/min    Comment: (NOTE) The eGFR has been calculated using the CKD EPI equation. This calculation has not been validated in all clinical situations. eGFR's  persistently <90 mL/min signify possible Chronic Kidney Disease.    Anion gap 15 5 - 15  CBC with Differential     Status: Abnormal   Collection Time: 06/12/14 12:31 PM  Result Value Ref Range   WBC 5.5 4.0 - 10.5 K/uL   RBC 4.08 3.87 - 5.11 MIL/uL   Hemoglobin 10.8 (L) 12.0 - 15.0 g/dL   HCT 33.8 (L) 36.0 - 46.0 %   MCV 82.8 78.0 - 100.0 fL   MCH 26.5 26.0 - 34.0 pg   MCHC 32.0 30.0 - 36.0 g/dL   RDW 17.6 (H) 11.5 - 15.5 %   Platelets 263 150 - 400 K/uL   Neutrophils Relative % 63 43 - 77 %   Neutro Abs 3.5 1.7 - 7.7 K/uL   Lymphocytes Relative 29 12 - 46 %   Lymphs Abs 1.6 0.7 - 4.0 K/uL   Monocytes Relative 6 3 - 12 %   Monocytes Absolute 0.3 0.1 - 1.0 K/uL   Eosinophils Relative 2 0 - 5 %   Eosinophils Absolute 0.1 0.0 - 0.7 K/uL   Basophils Relative 0 0 - 1 %   Basophils Absolute 0.0 0.0 - 0.1 K/uL  Protime-INR     Status: Abnormal   Collection Time: 06/12/14 12:31 PM  Result Value Ref Range   Prothrombin Time 45.3 (H) 11.6 - 15.2 seconds   INR 4.80 (H) 0.00 - 1.49  APTT     Status: Abnormal   Collection Time: 06/12/14 12:31 PM  Result Value Ref Range   aPTT 132 (H) 24 - 37 seconds    Comment:        IF BASELINE aPTT IS ELEVATED, SUGGEST PATIENT RISK ASSESSMENT BE USED TO DETERMINE APPROPRIATE ANTICOAGULANT THERAPY.   Magnesium     Status: Abnormal   Collection Time: 06/12/14 12:31 PM  Result Value Ref Range   Magnesium 1.0 (L) 1.5 - 2.5 mg/dL  Ethanol     Status: None   Collection Time: 06/12/14 12:33 PM  Result Value Ref Range   Alcohol, Ethyl (B) <5 0 - 9 mg/dL    Comment:        LOWEST DETECTABLE LIMIT FOR SERUM ALCOHOL IS 11 mg/dL FOR MEDICAL PURPOSES ONLY   CBG monitoring, ED     Status: Abnormal   Collection Time: 06/12/14 12:46 PM  Result Value Ref Range  Glucose-Capillary 183 (H) 70 - 99 mg/dL   Ct Head (brain) Wo Contrast  06/12/2014   CLINICAL DATA:  59 year old female with a history of right-sided tingling. Dizziness. History of  hypertension.  EXAM: CT HEAD WITHOUT CONTRAST  TECHNIQUE: Contiguous axial images were obtained from the base of the skull through the vertex without intravenous contrast.  COMPARISON:  03/21/2005  FINDINGS: Unremarkable appearance of the calvarium without acute fracture or aggressive lesion.  Unremarkable appearance of the scalp soft tissues.  Unremarkable appearance of the bilateral orbits.  Mastoid air cells are clear.  No significant paranasal sinus disease  No acute intracranial hemorrhage, midline shift, or mass effect.  Gray-white differentiation is maintained, without CT evidence of acute ischemia.  Unremarkable configuration of the ventricles.  IMPRESSION: No CT evidence of acute intracranial abnormality.  Signed,  Dulcy Fanny. Earleen Newport, DO  [facility]  [facility]   Electronically Signed   By: Corrie Mckusick D.O.   On: 06/12/2014 13:19   Mr Brain Wo Contrast  06/12/2014   CLINICAL DATA:  59 year old female with left side numbness and weakness for less than 24 hours. Initial encounter.  EXAM: MRI HEAD WITHOUT CONTRAST  TECHNIQUE: Multiplanar, multiecho pulse sequences of the brain and surrounding structures were obtained without intravenous contrast.  COMPARISON:  Head CT without contrast 1258 hours today.  FINDINGS: Cerebral volume is within normal limits for age. No restricted diffusion to suggest acute infarction. No midline shift, mass effect, evidence of mass lesion, ventriculomegaly, extra-axial collection or acute intracranial hemorrhage. Cervicomedullary junction and pituitary are within normal limits. Major intracranial vascular flow voids are preserved.  Scattered cerebral white matter T2 and FLAIR hyperintensity is Mild for age. There are superimposed dilated perivascular spaces. No chronic blood products identified. No cortical encephalomalacia identified.  Visible internal auditory structures appear normal. Visualized paranasal sinuses and mastoids  are clear. Visualized orbit soft tissues are within normal limits. Visualized scalp soft tissues are within normal limits. Normal bone marrow signal. Partially visible cervical ACDF hardware artifact.  IMPRESSION: 1.  No acute intracranial abnormality. 2. Mild for age nonspecific cerebral white matter signal changes.   Electronically Signed   By: Genevie Ann M.D.   On: 06/12/2014 17:37    Review of Systems  Constitutional: Negative.   HENT: Negative.   Eyes: Negative.   Respiratory: Negative.   Cardiovascular: Negative.   Gastrointestinal: Positive for nausea, vomiting and diarrhea.  Genitourinary: Negative.   Musculoskeletal: Negative.   Skin: Negative.   Neurological: Positive for sensory change. Negative for dizziness, tingling, tremors, speech change, focal weakness, seizures and loss of consciousness.  Endo/Heme/Allergies: Negative.   Psychiatric/Behavioral: Negative.     Blood pressure 130/85, pulse 87, temperature 99.2 F (37.3 C), temperature source Oral, resp. rate 20, SpO2 97 %. Physical Exam  Constitutional: She is oriented to person, place, and time. She appears well-developed and well-nourished.  HENT:  Head: Normocephalic and atraumatic.  Eyes: Conjunctivae and EOM are normal. Pupils are equal, round, and reactive to light. No scleral icterus.  Neck: Normal range of motion. Neck supple. No JVD present. No tracheal deviation present. No thyromegaly present.  Cardiovascular: Normal rate and regular rhythm.  Exam reveals no gallop and no friction rub.   No murmur heard. Respiratory: Effort normal and breath sounds normal. No respiratory distress. She has no wheezes. She has no rales.  GI: Soft. Bowel sounds are normal. She exhibits no distension. There is no tenderness. There is no rebound and no guarding.  Musculoskeletal: Normal range of motion. She exhibits no edema or tenderness.  Lymphadenopathy:    She has no cervical adenopathy.  Neurological: She is alert and oriented  to person, place, and time. She has normal reflexes. She displays normal reflexes. No cranial nerve deficit. She exhibits normal muscle tone. Coordination normal.  Skin: Skin is warm and dry. No rash noted. No erythema. No pallor.  Psychiatric: She has a normal mood and affect. Her behavior is normal. Judgment and thought content normal.     Assessment/Plan Numbness, tingling ? Hypokalemia, vs tia Check carotid u/s, cardiac echo Replete potassium,  Neurology consultation appreciated  Hypokalemia Replete potassium   Hypomagnesemia Replete magnesium  Dm2 fsbs ac and qhs, iss  Hypocalcemia Check cmp in am  Coagulopathy Hold coumadin Check INR in am  Abnormal lft Check ultrasound  N/v, diarrhea x2 weeks Check stool for fecal leukocytes, culture, c. Diff.   DVT prophylaxis: hold coumadin  Jani Gravel 06/12/2014, 6:23 PM

## 2014-06-12 NOTE — ED Notes (Signed)
Patient transported to CT 

## 2014-06-12 NOTE — ED Notes (Signed)
Pt now returned from MRI, resting comfortably in room

## 2014-06-12 NOTE — ED Notes (Signed)
Per ems, pt reports numbness to entire face, left arm and left leg since last night.  Pt appear diaphoretic, anxious, noted to have slight left facial droop and drift in left leg

## 2014-06-12 NOTE — ED Notes (Signed)
Patient transported to MRI 

## 2014-06-12 NOTE — ED Notes (Signed)
Report attempt x 1 

## 2014-06-12 NOTE — ED Provider Notes (Signed)
CSN: [id number]     Arrival date & time 06/12/14  1207 History   First MD Initiated Contact with Patient 06/12/14 1221     Chief Complaint  Patient presents with  . Numbness     (Consider location/radiation/quality/duration/timing/severity/associated sxs/prior Treatment) The history is provided by the patient and medical records.   This is a 59 y.o. 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  Patient states symptoms began around midnight last night. She went to bed and hoped that symptoms would be gone this morning, however upon waking symptoms were worse. She states she has a tingling sensation in her left arm and leg and along the left side of her face. She states her face feels somewhat droopy on left.  Patient has no known history of TIA or stroke. She does have history of clotting disorder and recurrent DVTs, she is currently anticoagulated with Coumadin.  Patient ambulates with either walker/cane at baseline.  She does not gait has been slightly unsteady recently.  Past Medical History  Diagnosis Date  . DVT (deep venous thrombosis)     Recurrent  . Adrenal mass 03/226    Benign  . Lupus Anticoagulant Positive   . Clotting disorder     +beta-2-glycoprotein IgA antibody  . History of cocaine abuse     Remote history   . Heart murmur   . Hypertension     takes meds daily  . Depression   . Shortness of breath     exertion or lying flat  . 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 Sleep apnea     2l of oxygen at night  . On home oxygen therapy     at night  . Diabetes mellitus     120s usually fasting  . GERD (gastroesophageal reflux disease)   . Dizziness, nonspecific   . Arthritis     knees/multiple orthopedic conditons; lower back  . History of blood transfusion   . Fibromyalgia   . Asthma    per pt   Past Surgical History  Procedure Laterality Date  . Carpal tunnel release      Bilateral  . Abdominal hysterectomy  1989    Fibroids  . Back surgery      cervical spine---disk disease  . Cesarean section    . Replacement total knee  11/17/2008    bilateral  . Right elbow surgery    . Tubal ligation    . Gallstones removed    . Anterior lumbar fusion  01/03/2012    Procedure: ANTERIOR LUMBAR FUSION 1 LEVEL;  Surgeon: Winfield Cunas, MD;  Location: [facility];  Service: Neurosurgery;  Laterality: N/A;  Lumbar Four-Five Anterior Lumbar Interbody Fusion with Instrumentation  . Cystoscopy/retrograde/ureteroscopy/stone extraction with basket Right 04/26/2014    Procedure: CYSTOSCOPY/ RIGHT RETROGRADE/ RIGHT URETEROSCOPY/URETERAL AND RENAL PELVIS BIOPSY;  Surgeon: Alexis Frock, MD;  Location: [facility];  Service: Urology;  Laterality: Right;   Family History  Problem Relation Age of Onset  . Hypertension Father   . Heart failure Father   . Hypertension Mother   . Diabetes Mother   . Cancer Mother     Pancreatic   History  Substance Use Topics  . Smoking status: Former Smoker    Start date: 03/20/2001    Quit date: 03/21/2003  . Smokeless tobacco: Never  Used     Comment: smoked for 2 years  . Alcohol Use: No     Comment: beer in past    OB History    No data available     Review of Systems  Neurological: Positive for weakness and numbness.  All other systems reviewed and are negative.     Allergies  Corticosteroids  Home Medications   Prior to Admission medications   Medication Sig Start Date End Date Taking? Authorizing Provider  albuterol (PROVENTIL HFA;VENTOLIN HFA) 108 (90 BASE) MCG/ACT inhaler Inhale 2 puffs into the lungs every 4 (four) hours as needed for shortness of breath.    Historical Provider, MD  amLODipine (NORVASC) 5 MG tablet Take 5 mg by mouth daily.    Historical Provider, MD  cloNIDine (CATAPRES) 0.1 MG tablet Take 0.1 mg by mouth 2 (two)  times daily.    Historical Provider, MD  diltiazem (CARDIZEM CD) 180 MG 24 hr capsule Take 180 mg by mouth daily.    Historical Provider, MD  DULoxetine (CYMBALTA) 60 MG capsule Take 60 mg by mouth daily.    Historical Provider, MD  ferrous sulfate 325 (65 FE) MG tablet Take 1 tablet (325 mg total) by mouth 2 (two) times daily with a meal. 08/21/13   Erline Hau, MD  gabapentin (NEURONTIN) 300 MG capsule Take 300 mg by mouth 3 (three) times daily.    Historical Provider, MD  hydrochlorothiazide (MICROZIDE) 12.5 MG capsule Take 12.5 mg by mouth daily.    Historical Provider, MD  HYDROcodone-acetaminophen (NORCO) 7.5-325 MG per tablet Take 1 tablet by mouth every 6 (six) hours as needed for moderate pain or severe pain. 04/30/14   Modena Jansky, MD  lisinopril (PRINIVIL,ZESTRIL) 5 MG tablet Take 5 mg by mouth daily.    Historical Provider, MD  loratadine (CLARITIN) 10 MG tablet Take 10 mg by mouth daily as needed for allergies.    Historical Provider, MD  metFORMIN (GLUCOPHAGE) 500 MG tablet Take 500 mg by mouth at bedtime.    Historical Provider, MD  omeprazole (PRILOSEC) 20 MG capsule Take 20 mg by mouth 2 (two) times daily.    Historical Provider, MD  polyethylene glycol (MIRALAX / GLYCOLAX) packet Take 17 g by mouth daily.    Historical Provider, MD  pravastatin (PRAVACHOL) 40 MG tablet Take 1 tablet by mouth daily. 02/18/12   Historical Provider, MD  QUEtiapine (SEROQUEL) 50 MG tablet Take 50 mg by mouth 2 (two) times daily.    Historical Provider, MD  warfarin (COUMADIN) 10 MG tablet Take 10 mg by mouth daily.    Historical Provider, MD   BP 145/99 mmHg  Pulse 99  Temp(Src) 99.2 F (37.3 C) (Oral)  Resp 19  SpO2 94%   Physical Exam  Constitutional: She is oriented to person, place, and time. She appears well-developed and well-nourished.  Slightly diaphoretic  HENT:  Head: Normocephalic and atraumatic.  Mouth/Throat: Oropharynx is clear and moist.  Eyes: Conjunctivae and  EOM are normal. Pupils are equal, round, and reactive to light.  EOMs fully intact, crossing midline; normal confrontation, no noted field cuts  Neck: Normal range of motion. Neck supple.  Cardiovascular: Normal rate, regular rhythm and normal heart sounds.   Pulmonary/Chest: Effort normal and breath sounds normal. No respiratory distress. She has no wheezes.  Abdominal: Soft. Bowel sounds are normal. There is no tenderness. There is no guarding.  Musculoskeletal: Normal range of motion.  Neurological: She is alert and oriented to person,  place, and time.  AAOx3, answering questions and following commands appropriately; decreased strength and sensation of left upper and lower extremities when compared with right, more pronounced in left leg; finger to nose normal bilaterally, slower on left side; negative pronator drift; slight left sided facial droop noted; gait not tested  Skin: Skin is warm and dry.  Psychiatric: She has a normal mood and affect.  Nursing note and vitals reviewed.   ED Course  Procedures (including critical care time) Labs Review Labs Reviewed  COMPREHENSIVE METABOLIC PANEL - Abnormal; Notable for the following:    Potassium 2.4 (*)    Chloride 94 (*)    Glucose, Bld 219 (*)    Calcium 7.7 (*)    AST 39 (*)    ALT 41 (*)    GFR calc non Af Amer 63 (*)    GFR calc Af Amer 74 (*)    All other components within normal limits  CBC WITH DIFFERENTIAL/PLATELET - Abnormal; Notable for the following:    Hemoglobin 10.8 (*)    HCT 33.8 (*)    RDW 17.6 (*)    All other components within normal limits  PROTIME-INR - Abnormal; Notable for the following:    Prothrombin Time 45.3 (*)    INR 4.80 (*)    All other components within normal limits  APTT - Abnormal; Notable for the following:    aPTT 132 (*)    All other components within normal limits  MAGNESIUM - Abnormal; Notable for the following:    Magnesium 1.0 (*)    All other components within normal limits  CBG  MONITORING, ED - Abnormal; Notable for the following:    Glucose-Capillary 183 (*)    All other components within normal limits  ETHANOL  URINALYSIS, ROUTINE W REFLEX MICROSCOPIC  URINE RAPID DRUG SCREEN (HOSP PERFORMED)    Imaging Review Ct Head (brain) Wo Contrast  06/12/2014   CLINICAL DATA:  59 year old female with a history of right-sided tingling. Dizziness. History of hypertension.  EXAM: CT HEAD WITHOUT CONTRAST  TECHNIQUE: Contiguous axial images were obtained from the base of the skull through the vertex without intravenous contrast.  COMPARISON:  03/21/2005  FINDINGS: Unremarkable appearance of the calvarium without acute fracture or aggressive lesion.  Unremarkable appearance of the scalp soft tissues.  Unremarkable appearance of the bilateral orbits.  Mastoid air cells are clear.  No significant paranasal sinus disease  No acute intracranial hemorrhage, midline shift, or mass effect.  Gray-white differentiation is maintained, without CT evidence of acute ischemia.  Unremarkable configuration of the ventricles.  IMPRESSION: No CT evidence of acute intracranial abnormality.  Signed,  Dulcy Fanny. Earleen Newport, DO  [facility]  [facility]   Electronically Signed   By: Corrie Mckusick D.O.   On: 06/12/2014 13:19     EKG Interpretation   Date/Time:  Friday June 12 2014 12:19:27 EDT Ventricular Rate:  98 PR Interval:  161 QRS Duration: 86 QT Interval:  383 QTC Calculation: 489 R Axis:   23 Text Interpretation:  Sinus rhythm LVH with secondary repolarization  abnormality Borderline prolonged QT interval When compared with ECG of  04/24/2014 No significant change was found Confirmed by [facility]  MD,  Nunzio Cory [phone]) on 06/12/2014 12:24:55 PM      MDM   Final diagnoses:  Left-sided weakness  Decreased sensation  Hypokalemia  Hypomagnesemia   59 y.o. F with left sided weakness and paresthesias for 12+ hours.  Code stroke not activated due  to  duration of symptoms.  On exam, patient with noted weakness and decreased sensation of left upper and lower extremities, more pronounced in left leg.  Lab work with hypokalemia and hypomagnesemia-- replacements ordered in ED.  INR mildly supratherapeutic, no active bleeding noted.  CT head negative for acute findings.  Patient continues to have symptoms here in the ED.  She has multiple comorbidities and is high risk for stroke. MRI will be obtained.  3:45 PM MRI pending at this time.  Care signed out to [place]-- will follow results with MRI, will consult neurology for any acute findings.  Patient will ultimately need admission for hypokalemia and hypomagnesemia regardless of MRI results.  Larene Pickett, PA-C 06/12/14 [place], DO 06/15/14 [phone]

## 2014-06-12 NOTE — ED Provider Notes (Signed)
3:26 PM Patient with extensive past history including recurrent DVT, on coumadin, no h/o stroke -- presents with c/o L-sided weakness and paresthesias. No code stroke due to duration of symptoms. Found to have low K and Mg here which are being repleated. CT neg, MRI pending.   Plan: admit for symptoms.  5:57 PM MRI does not show acute stroke. Patient continues to have paresthesias on L side, she states are intermittent. Daughter notes minimal L facial droop. She agrees to admission. Patient also notes she was unsteady on her feet last night and bumped her L leg which is causing her pain in left leg.   Exam:  Gen NAD; Heart RRR, nml S1,S2, no m/r/g; Lungs CTAB; Abd soft, NT, no rebound or guarding; Ext 2+ pedal pulses bilaterally, 2+ bilateral edema; Neuro CN II-XII intact, trace L upper and L lower extremity weakness, patient reports decreased sensation L face, L arm, L leg.   6:11 PM Spoke to Dr. Maudie Mercury who will admit.   6:21 PM Spoke with Dr. Nicole Kindred at request of hospitalist.   Carlisle Cater, PA-C 06/12/14 1811  Carlisle Cater, PA-C 06/12/14 [place], MD 06/12/14 [phone]

## 2014-06-12 NOTE — Consult Note (Signed)
Neurology Consultation Reason for Consult: Paresthesia Referring Physician: Georges Mouse  CC: Paresthesia  History is obtained from: Patient, daughter  HPI: [name] is a 59 y.o. female with a history of hypercoagulability, DVT, cocaine use, hypertension who presents with paresthesia that started around midnight last night. She states that it began with a tingling down her left foot and has ascended to involve her entire left side including her face. She denies weakness, headache, photophobia.  Of note, she has had a severe GI illness for the past 2 days and has been having both diarrhea and vomiting. The ER, she was found to be hypocalcemic, hypo-bulimic, hypomagnesemic.  She reports she threw up some blood recently.  ROS: A 14 point ROS was performed and is negative except as noted in the HPI.   Past Medical History  Diagnosis Date  . DVT (deep venous thrombosis)     Recurrent  . Adrenal mass 03/226    Benign  . Lupus Anticoagulant Positive   . Clotting disorder     +beta-2-glycoprotein IgA antibody  . History of cocaine abuse     Remote history   . Heart murmur   . Hypertension     takes meds daily  . Depression   . Shortness of breath     exertion or lying flat  . 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 Sleep apnea     2l of oxygen at night  . On home oxygen therapy     at night  . Diabetes mellitus     120s usually fasting  . GERD (gastroesophageal reflux disease)   . Dizziness, nonspecific   . Arthritis     knees/multiple orthopedic conditons; lower back  . History of blood transfusion   . Fibromyalgia   . Asthma     per pt    Family History: No history of stroke  Social History: Tob: Denies  Exam: Current vital signs: BP 149/72 mmHg  Pulse 87  Temp(Src) 99.2 F (37.3 C) (Oral)  Resp 18  SpO2 99% Vital signs in last 24 hours: Temp:  [99.2 F (37.3 C)] 99.2 F (37.3 C) (03/25 1725) Pulse Rate:   [85-99] 87 (03/25 1800) Resp:  [16-23] 18 (03/25 1800) BP: (129-149)/(72-114) 149/72 mmHg (03/25 1929) SpO2:  [94 %-99 %] 99 % (03/25 1800)  Physical Exam  Constitutional: Appears well-developed and well-nourished.  Psych: Affect appropriate to situation Eyes: No scleral injection HENT: No OP obstrucion Head: Normocephalic.  Cardiovascular: Normal rate and regular rhythm.  Respiratory: Effort normal  GI: Soft.  No distension. There is no tenderness.  Skin: WDI  Neuro: Mental Status: Patient is awake, alert, oriented to person, place, month, year, and situation. Patient is able to give a clear and coherent history. No signs of aphasia or neglect Cranial Nerves: II: Visual Fields are full. Pupils are equal, round, and reactive to light.   III,IV, VI: EOMI without ptosis or diploplia.  V: Facial sensation is symmetric to temperature VII: Facial movement is symmetric.  VIII: hearing is intact to voice X: Uvula elevates symmetrically XI: Shoulder shrug is symmetric. XII: tongue is midline without atrophy or fasciculations.  Motor: Tone is normal. Bulk is normal. 5/5 strength was present on the right side and left arm. She is markedly limited due to pain in her left leg. Sensory: Sensation is decreased throughout the left side Cerebellar: FNF intact bilaterally    I have reviewed labs in epic  and the results pertinent to this consultation are: Magnesium-1.0 Potassium-2.4 Calcium-7.7  I have reviewed the images obtained: MRI brain-no acute infarct  Impression: 59 year old female with paresthesias of the left side. Unilateral character is unusual, but I do wonder if it could be related to her electrolyte abnormalities. With negative MRI, and positive symptoms I think stroke is less likely. Given her report of recent hematemesis, I would favor holding antiplatelet and anticoagulant therapy.  Recommendations: 1) replete electrolytes 2) further workup of GI complaints,  electrolyte abnormalities per internal medicine.   Roland Rack, MD [facility] [phone]  If 7pm- 7am, please page neurology on call as listed in [place].

## 2014-06-12 NOTE — ED Notes (Signed)
Report attempt x2.

## 2014-06-13 ENCOUNTER — Observation Stay (HOSPITAL_COMMUNITY): Payer: Medicare Other

## 2014-06-13 DIAGNOSIS — E876 Hypokalemia: Secondary | ICD-10-CM

## 2014-06-13 DIAGNOSIS — E1165 Type 2 diabetes mellitus with hyperglycemia: Secondary | ICD-10-CM

## 2014-06-13 DIAGNOSIS — G459 Transient cerebral ischemic attack, unspecified: Secondary | ICD-10-CM | POA: Diagnosis not present

## 2014-06-13 DIAGNOSIS — I10 Essential (primary) hypertension: Secondary | ICD-10-CM

## 2014-06-13 DIAGNOSIS — R202 Paresthesia of skin: Secondary | ICD-10-CM | POA: Diagnosis not present

## 2014-06-13 DIAGNOSIS — R209 Unspecified disturbances of skin sensation: Secondary | ICD-10-CM | POA: Diagnosis not present

## 2014-06-13 LAB — CBC
HCT: 32.8 % — ABNORMAL LOW (ref 36.0–46.0)
Hemoglobin: 10.4 g/dL — ABNORMAL LOW (ref 12.0–15.0)
MCH: 26.4 pg (ref 26.0–34.0)
MCHC: 31.7 g/dL (ref 30.0–36.0)
MCV: 83.2 fL (ref 78.0–100.0)
Platelets: 288 10*3/uL (ref 150–400)
RBC: 3.94 MIL/uL (ref 3.87–5.11)
RDW: 17.9 % — ABNORMAL HIGH (ref 11.5–15.5)
WBC: 5 10*3/uL (ref 4.0–10.5)

## 2014-06-13 LAB — COMPREHENSIVE METABOLIC PANEL
ALT: 35 U/L (ref 0–35)
AST: 28 U/L (ref 0–37)
Albumin: 3.3 g/dL — ABNORMAL LOW (ref 3.5–5.2)
Alkaline Phosphatase: 83 U/L (ref 39–117)
Anion gap: 13 (ref 5–15)
BUN: 9 mg/dL (ref 6–23)
CO2: 30 mmol/L (ref 19–32)
Calcium: 7.2 mg/dL — ABNORMAL LOW (ref 8.4–10.5)
Chloride: 97 mmol/L (ref 96–112)
Creatinine, Ser: 0.72 mg/dL (ref 0.50–1.10)
GFR calc Af Amer: >90 mL/min (ref >90)
GFR calc non Af Amer: >90 mL/min (ref >90)
Glucose, Bld: 124 mg/dL — ABNORMAL HIGH (ref 70–99)
Potassium: 2.7 mmol/L — CL (ref 3.5–5.1)
Sodium: 140 mmol/L (ref 135–145)
Total Bilirubin: 0.5 mg/dL (ref 0.3–1.2)
Total Protein: 6.5 g/dL (ref 6.0–8.3)

## 2014-06-13 LAB — PROTIME-INR
INR: 3.9 — ABNORMAL HIGH (ref 0.00–1.49)
Prothrombin Time: 38.5 seconds — ABNORMAL HIGH (ref 11.6–15.2)

## 2014-06-13 LAB — MAGNESIUM: Magnesium: 1.4 mg/dL — ABNORMAL LOW (ref 1.5–2.5)

## 2014-06-13 LAB — GLUCOSE, CAPILLARY
Glucose-Capillary: 124 mg/dL — ABNORMAL HIGH (ref 70–99)
Glucose-Capillary: 160 mg/dL — ABNORMAL HIGH (ref 70–99)
Glucose-Capillary: 144 mg/dL — ABNORMAL HIGH (ref 70–99)
Glucose-Capillary: 164 mg/dL — ABNORMAL HIGH (ref 70–99)

## 2014-06-13 MED ORDER — POTASSIUM CHLORIDE CRYS ER 20 MEQ PO TBCR
40.0000 meq | EXTENDED_RELEASE_TABLET | ORAL | Status: AC
  Administered 2014-06-13 (×3): 40 meq via ORAL
  Filled 2014-06-13 (×3): qty 2

## 2014-06-13 MED ORDER — MAGNESIUM SULFATE IN D5W 10-5 MG/ML-% IV SOLN
1.0000 g | Freq: Once | INTRAVENOUS | Status: AC
  Administered 2014-06-13: 1 g via INTRAVENOUS
  Filled 2014-06-13: qty 100

## 2014-06-13 MED ORDER — POTASSIUM CHLORIDE 10 MEQ/100ML IV SOLN
10.0000 meq | INTRAVENOUS | Status: AC
  Administered 2014-06-13 (×3): 10 meq via INTRAVENOUS
  Filled 2014-06-13 (×2): qty 100

## 2014-06-13 NOTE — Progress Notes (Signed)
PT Cancellation Note  Patient Details Name: [name] MRN: [id number] DOB: 05/19/1955   Cancelled Treatment:    Reason Eval/Treat Not Completed: Medical issues which prohibited therapy.  Pt's INR is 3.9 (>4.0 pt is at risk for hemarthrosis) and K+ is low (treatment has been initiated, but pt is symptomatic- numbness and tingling/paresthesias).  PT to check back again in AM to see if labs are trending towards more normal values and complete evaluation then. Thanks,    [name], [place], DPT [phone]   06/13/2014, 5:59 PM

## 2014-06-13 NOTE — Progress Notes (Signed)
Nutrition Brief Note  Patient identified on the Malnutrition Screening Tool (MST) Report  Wt Readings from Last 15 Encounters:  06/13/14 281 lb 9.6 oz (127.733 kg)  04/30/14 279 lb 14.4 oz (126.962 kg)  10/13/13 276 lb 3.2 oz (125.283 kg)  08/17/13 281 lb 1.4 oz (127.5 kg)  02/17/13 260 lb (117.935 kg)  09/05/12 276 lb 9.6 oz (125.465 kg)  01/24/12 281 lb 4 oz (127.574 kg)  01/03/12 272 lb (123.378 kg)  12/07/11 275 lb 1.6 oz (124.785 kg)  11/30/11 272 lb (123.378 kg)  11/06/11 288 lb 9.3 oz (130.9 kg)  09/12/11 278 lb 3.2 oz (126.191 kg)  03/27/11 268 lb (121.564 kg)  06/10/10 261 lb 3.2 oz (118.48 kg)    Body mass index is 45.47 kg/(m^2). Patient meets criteria for morbid obesity based on current BMI.   Current diet order is CHO Mod Med.  Labs and medications reviewed. Note electrolyte abnormalities likely related to recent viral illness; being repleted.  Weight is stable and consistent with patient's usual weight.  No nutrition interventions warranted at this time. If nutrition issues arise, please consult RD.   Brynda Greathouse, MS RD LDN Clinical Inpatient Dietitian Weekend/After hours pager: [phone]

## 2014-06-13 NOTE — Progress Notes (Signed)
ANTICOAGULATION CONSULT NOTE - Follow Up Consult  Pharmacy Consult for Warfarin Indication: recurrent DVT, lupus anticoagulant +  Allergies  Allergen Reactions  . Corticosteroids Rash    States she developed a rash on her tongue after a steroid injection in her back    Patient Measurements: Height: 5\' 6"  (167.6 cm) Weight: 281 lb 9.6 oz (127.733 kg) IBW/kg (Calculated) : 59.3  Vital Signs: Temp: 98.8 F (37.1 C) (03/26 1024) Temp Source: Oral (03/26 1024) BP: 128/80 mmHg (03/26 1024) Pulse Rate: 85 (03/26 1024)  Labs:  Recent Labs  06/12/14 1231 06/12/14 2020 06/13/14 0630  HGB 10.8*  --  10.4*  HCT 33.8*  --  32.8*  PLT 263  --  288  APTT 132*  --   --   LABPROT 45.3* 42.2* 38.5*  INR 4.80* 4.39* 3.90*  CREATININE 0.96  --  0.72    Estimated Creatinine Clearance: 103.6 mL/min (by C-G formula based on Cr of 0.72).   Medications:  PTA Coumadin 5mg  daily except 10mg  on Tues/Thurs  Assessment: 59 year old female admitted with paresthesias.  She is on chronic anticoagulation with Coumadin for positive lupus anticoagulant and recurrent DVT.  Her INR was supratherapeutic on admission and remains supratherapeutic today.  [place] of Therapy:  INR 2-3   Plan:  No Coumadin today. Daily PT/INR  Legrand Como, Pharm.D., BCPS, AAHIVP Clinical Pharmacist Phone: [phone] or [phone] 06/13/2014, 11:20 AM

## 2014-06-13 NOTE — Progress Notes (Signed)
UR completed 

## 2014-06-13 NOTE — Progress Notes (Signed)
[facility] PROGRESS NOTE  [name] GEX:[id number] DOB: [birth year] DOA: 06/12/2014 PCP: Dorrene German, MD   Assessment and Plan 1. Paresthesias/Numbness -suspect due to hypokalemia, hypocalcemia -improving -cmet pending this am -replete electrolytes -MRI negative -Neuro following -PT eval  2. H/o DVT and lupus AC -resume warfarin when INR appropriate -per pharmacy  3. HTN -stop HCTZ due to severe hypokalemia  4. Diarrhea and vomiting -likely gastroenteritis, appears to be resolving -supportive care -Cdiff PCR pending  5. DM -hold metformin, SSI  6. HTN -continue clonidine, diltiazem  DVT proph: on warfarin  Code Status: Full Code Family Communication: none at bedside (indicate person spoken with, relationship, and if by phone, the number) Disposition Plan: home tomorrow pending workup   Consultants:  NEuro  HPI/Subjective: Numbness still present but improving  Objective: Filed Vitals:   06/13/14 0600  BP: 145/87  Pulse: 85  Temp: 98.4 F (36.9 C)  Resp: 15   No intake or output data in the 24 hours ending 06/13/14 0749 Filed Weights   06/12/14 1954 06/13/14 0500  Weight: 118.933 kg (262 lb 3.2 oz) 127.733 kg (281 lb 9.6 oz)    Exam:   General:  AAOx3  Cardiovascular: S1S2/RRR  Respiratory: CTAB  Abdomen: soft, Nt, BS present  Musculoskeletal: no edema c/c  Neuro: mild paresthesias of L arm and leg   Data Reviewed: Basic Metabolic Panel:  Recent Labs Lab 06/12/14 1231 06/12/14 2020  NA 137  --   K 2.4*  --   CL 94*  --   CO2 28  --   GLUCOSE 219*  --   BUN 10  --   CREATININE 0.96  --   CALCIUM 7.7*  --   MG 1.0*  --   PHOS  --  2.7   Liver Function Tests:  Recent Labs Lab 06/12/14 1231  AST 39*  ALT 41*  ALKPHOS 87  BILITOT 0.4  PROT 7.1  ALBUMIN 3.6   No results for input(s): LIPASE, AMYLASE in the last 168 hours. No results for input(s): AMMONIA in the last 168 hours. CBC:  Recent Labs Lab  06/12/14 1231  WBC 5.5  NEUTROABS 3.5  HGB 10.8*  HCT 33.8*  MCV 82.8  PLT 263   Cardiac Enzymes: No results for input(s): CKTOTAL, CKMB, CKMBINDEX, TROPONINI in the last 168 hours. BNP (last 3 results) No results for input(s): BNP in the last 8760 hours.  ProBNP (last 3 results)  Recent Labs  08/17/13 0953  PROBNP 54.7    CBG:  Recent Labs Lab 06/12/14 1246 06/12/14 2150  GLUCAP 183* 189*    No results found for this or any previous visit (from the past 240 hour(s)).   Studies: Ct Head (brain) Wo Contrast  06/12/2014   CLINICAL DATA:  59 year old female with a history of right-sided tingling. Dizziness. History of hypertension.  EXAM: CT HEAD WITHOUT CONTRAST  TECHNIQUE: Contiguous axial images were obtained from the base of the skull through the vertex without intravenous contrast.  COMPARISON:  03/21/2005  FINDINGS: Unremarkable appearance of the calvarium without acute fracture or aggressive lesion.  Unremarkable appearance of the scalp soft tissues.  Unremarkable appearance of the bilateral orbits.  Mastoid air cells are clear.  No significant paranasal sinus disease  No acute intracranial hemorrhage, midline shift, or mass effect.  Gray-white differentiation is maintained, without CT evidence of acute ischemia.  Unremarkable configuration of the ventricles.  IMPRESSION: No CT evidence of acute intracranial abnormality.  Signed,  Yvone Neu. Loreta Ave,  DO  [facility]  [facility]   Electronically Signed   By: Gilmer Mor D.O.   On: 06/12/2014 13:19   Mr Brain Wo Contrast  06/12/2014   CLINICAL DATA:  59 year old female with left side numbness and weakness for less than 24 hours. Initial encounter.  EXAM: MRI HEAD WITHOUT CONTRAST  TECHNIQUE: Multiplanar, multiecho pulse sequences of the brain and surrounding structures were obtained without intravenous contrast.  COMPARISON:  Head CT without contrast 1258 hours today.  FINDINGS:  Cerebral volume is within normal limits for age. No restricted diffusion to suggest acute infarction. No midline shift, mass effect, evidence of mass lesion, ventriculomegaly, extra-axial collection or acute intracranial hemorrhage. Cervicomedullary junction and pituitary are within normal limits. Major intracranial vascular flow voids are preserved.  Scattered cerebral white matter T2 and FLAIR hyperintensity is Mild for age. There are superimposed dilated perivascular spaces. No chronic blood products identified. No cortical encephalomalacia identified.  Visible internal auditory structures appear normal. Visualized paranasal sinuses and mastoids are clear. Visualized orbit soft tissues are within normal limits. Visualized scalp soft tissues are within normal limits. Normal bone marrow signal. Partially visible cervical ACDF hardware artifact.  IMPRESSION: 1.  No acute intracranial abnormality. 2. Mild for age nonspecific cerebral white matter signal changes.   Electronically Signed   By: Odessa Fleming M.D.   On: 06/12/2014 17:37    Scheduled Meds: . amLODipine  5 mg Oral Daily  . calcium gluconate  1 g Intravenous Once  . cloNIDine  0.1 mg Oral BID  . colchicine  0.6 mg Oral Daily  . diltiazem  180 mg Oral Daily  . DULoxetine  60 mg Oral Daily  . gabapentin  300 mg Oral TID  . hydrochlorothiazide  12.5 mg Oral Daily  . insulin aspart  0-5 Units Subcutaneous QHS  . insulin aspart  0-9 Units Subcutaneous TID WC  . metFORMIN  500 mg Oral QHS  . pantoprazole  40 mg Oral Daily  . polyethylene glycol  17 g Oral Daily  . QUEtiapine  50 mg Oral BID  . simvastatin  20 mg Oral q1800  . sodium chloride  3 mL Intravenous Q12H   Continuous Infusions: . 0.9 % NaCl with KCl 40 mEq / L 50 mL/hr (06/12/14 2000)   Antibiotics Given (last 72 hours)    None      Active Problems:   Hypokalemia   HTN (hypertension)   Diabetes type 2, uncontrolled   Paresthesia    Time spent:     [facility]  [facility] Pager [phone]. If 7PM-7AM, please contact night-coverage at [email or web address], password [facility] 06/13/2014, 7:49 AM

## 2014-06-14 DIAGNOSIS — R209 Unspecified disturbances of skin sensation: Secondary | ICD-10-CM | POA: Diagnosis not present

## 2014-06-14 DIAGNOSIS — R208 Other disturbances of skin sensation: Secondary | ICD-10-CM | POA: Insufficient documentation

## 2014-06-14 DIAGNOSIS — E1165 Type 2 diabetes mellitus with hyperglycemia: Secondary | ICD-10-CM | POA: Diagnosis not present

## 2014-06-14 LAB — COMPREHENSIVE METABOLIC PANEL
ALT: 35 U/L (ref 0–35)
AST: 29 U/L (ref 0–37)
Albumin: 3.2 g/dL — ABNORMAL LOW (ref 3.5–5.2)
Alkaline Phosphatase: 80 U/L (ref 39–117)
Anion gap: 6 (ref 5–15)
BUN: 7 mg/dL (ref 6–23)
CO2: 33 mmol/L — ABNORMAL HIGH (ref 19–32)
Calcium: 7.5 mg/dL — ABNORMAL LOW (ref 8.4–10.5)
Chloride: 102 mmol/L (ref 96–112)
Creatinine, Ser: 0.73 mg/dL (ref 0.50–1.10)
GFR calc Af Amer: >90 mL/min (ref >90)
GFR calc non Af Amer: >90 mL/min (ref >90)
Glucose, Bld: 142 mg/dL — ABNORMAL HIGH (ref 70–99)
Potassium: 3.4 mmol/L — ABNORMAL LOW (ref 3.5–5.1)
Sodium: 141 mmol/L (ref 135–145)
Total Bilirubin: 0.3 mg/dL (ref 0.3–1.2)
Total Protein: 6.8 g/dL (ref 6.0–8.3)

## 2014-06-14 LAB — PROTIME-INR
INR: 3.33 — ABNORMAL HIGH (ref 0.00–1.49)
Prothrombin Time: 34 seconds — ABNORMAL HIGH (ref 11.6–15.2)

## 2014-06-14 LAB — GLUCOSE, CAPILLARY
Glucose-Capillary: 163 mg/dL — ABNORMAL HIGH (ref 70–99)
Glucose-Capillary: 151 mg/dL — ABNORMAL HIGH (ref 70–99)

## 2014-06-14 LAB — PARATHYROID HORMONE, INTACT (NO CA): PTH: 120 pg/mL — ABNORMAL HIGH (ref 15–65)

## 2014-06-14 MED ORDER — WARFARIN SODIUM 5 MG PO TABS: 5.0000 mg | ORAL_TABLET | Freq: Every day | ORAL | Status: DC

## 2014-06-14 MED ORDER — POTASSIUM CHLORIDE CRYS ER 20 MEQ PO TBCR
40.0000 meq | EXTENDED_RELEASE_TABLET | Freq: Once | ORAL | Status: AC
  Administered 2014-06-14: 40 meq via ORAL
  Filled 2014-06-14: qty 2

## 2014-06-14 NOTE — Evaluation (Signed)
Physical Therapy Evaluation Patient Details Name: [name] MRN: [id number] DOB: 14-Aug-1955 Today's Date: 06/14/2014   History of Present Illness  59 y.o. female with a history of hypercoagulability, DVT, cocaine use, hypertension who presents with paresthesia that started around midnight last night. She states that it began with a tingling down her left foot and has ascended to involve her entire left side including her face. She denies weakness, headache, photophobia.  Clinical Impression  Pt presents with generalized weakness but is mod I with gait, mobility and transfers. PT encouraged pt to use RW at all times at home for energy conservation and safety.  No further acute PT needs at this time.    Follow Up Recommendations No PT follow up    Equipment Recommendations       Recommendations for Other Services       Precautions / Restrictions Precautions Precautions: Fall Restrictions Weight Bearing Restrictions: No      Mobility  Bed Mobility Overal bed mobility: Modified Independent             General bed mobility comments: increased time  Transfers Overall transfer level: Modified independent Equipment used: Rolling walker (2 wheeled)             General transfer comment: pt able to sit to stand from bed and toilet without assistance with use of RW  Ambulation/Gait Ambulation/Gait assistance: Modified independent (Device/Increase time) Ambulation Distance (Feet): 100 Feet Assistive device: Rolling walker (2 wheeled)     Gait velocity interpretation: Below normal speed for age/gender General Gait Details: pt required 2 standing rest breaks during gait with RW after standing x 5 minutes for grooming tasks.  PT encouraged pt to use RW at all times at home for safety and energy conservation  Stairs            Wheelchair Mobility    Modified Rankin (Stroke Patients Only)       Balance Overall balance assessment: Modified Independent                                           Pertinent Vitals/Pain Pain Assessment: No/denies pain    Home Living Family/patient expects to be discharged to:: Private residence Living Arrangements: Alone   Type of Home: Apartment Home Access: Elevator     Home Layout: One level Home Equipment: Environmental consultant - 4 wheels;Walker - 2 wheels      Prior Function Level of Independence: Independent with assistive device(s)               Hand Dominance        Extremity/Trunk Assessment               Lower Extremity Assessment: Generalized weakness      Cervical / Trunk Assessment: Kyphotic  Communication   Communication: No difficulties  Cognition Arousal/Alertness: Awake/alert Behavior During Therapy: WFL for tasks assessed/performed                        General Comments      Exercises        Assessment/Plan    PT Assessment Patent does not need any further PT services  PT Diagnosis Generalized weakness   PT Problem List    PT Treatment Interventions     PT Goals (Current goals can be found in the Care Plan section) Acute  Rehab PT Goals PT Scheff Formulation: All assessment and education complete, DC therapy    Frequency     Barriers to discharge        Co-evaluation               End of Session Equipment Utilized During Treatment: Gait belt Activity Tolerance: Patient tolerated treatment well Patient left: in bed;with call bell/phone within reach;with bed alarm set Nurse Communication: Mobility status         Time: 8416-6063 PT Time Calculation (min) (ACUTE ONLY): 25 min   Charges:   PT Evaluation $Initial PT Evaluation Tier I: 1 Procedure PT Treatments $Gait Training: 8-22 mins   PT G Codes:        Dhanvin Szeto 06/15/2014, 8:42 AM

## 2014-06-14 NOTE — Progress Notes (Signed)
D/C orders received. Pt educated on d/c instructions and verbalized understanding. IV and tele removed. PT handed d/c packet. Pt taken downstairs by staff via wheelchair.

## 2014-06-14 NOTE — Discharge Summary (Addendum)
Physician Discharge Summary  [name] VHQ:[id number] DOB: 1955-07-16 DOA: 06/12/2014  PCP: Dorrene German, MD  Admit date: 06/12/2014 Discharge date: 06/14/2014  Time spent: 45 minutes  Recommendations for Outpatient Follow-up:  1. PCP in 1 week, needs INR check at [facility] 2. Stopped HCTZ due to hypokalemia  Discharge Diagnoses:      Paresthesia   Decreased sensation   Hypokalemia   HTN (hypertension)   Diabetes type 2, uncontrolled   Discharge Condition: stable  Diet recommendation: diabetic  Filed Weights   06/12/14 1954 06/13/14 0500  Weight: 118.933 kg (262 lb 3.2 oz) 127.733 kg (281 lb 9.6 oz)    History of present illness:  59 yo female with dm2, htn, lupus anticoagulant, c/o left sided numbness, tingling since 3/24 at 12 pm . + left facial numbness.  Hospital Course:  1. Paresthesias/Numbness -suspect due to hypokalemia, hypocalcemia -resolved with correction of electrolytes , MRI negative -seen by Neurology -PT eval completed  2. H/o DVT and lupus AC -INR was supratherapeutic on admission -warfarin was held initially and today INR down to 3.3 -resume warfarin at 5mg  on Monday  3. HTN -stopped HCTZ due to severe hypokalemia  4. Diarrhea and vomiting -likely gastroenteritis, resolved with supportive care  5. DM -resume metformin  6. HTN -continue clonidine, diltiazem  Consultations:  Neurology  Discharge Exam: Filed Vitals:   06/14/14 0956  BP: 147/82  Pulse: 86  Temp: 98.6 F (37 C)  Resp: 18    General: AAOx3 Cardiovascular: S1S2/RRR Respiratory: CTAB  Discharge Instructions   Discharge Instructions    Diet - low sodium heart healthy    Complete by:  As directed      Increase activity slowly    Complete by:  As directed           Current Discharge Medication List    CONTINUE these medications which have CHANGED   Details  warfarin (COUMADIN) 5 MG tablet Take 1 tablet (5 mg total) by mouth daily. Take 1 tab daily starting  tomorrow 3/28      CONTINUE these medications which have NOT CHANGED   Details  albuterol (PROVENTIL HFA;VENTOLIN HFA) 108 (90 BASE) MCG/ACT inhaler Inhale 2 puffs into the lungs every 4 (four) hours as needed for shortness of breath.    albuterol (PROVENTIL) (2.5 MG/3ML) 0.083% nebulizer solution Take 2.5 mg by nebulization every 6 (six) hours as needed for wheezing or shortness of breath.    amLODipine (NORVASC) 5 MG tablet Take 5 mg by mouth daily.    cloNIDine (CATAPRES) 0.1 MG tablet Take 0.1 mg by mouth 2 (two) times daily.    colchicine 0.6 MG tablet Take 0.6 mg by mouth daily.  Refills: 2    cyclobenzaprine (FLEXERIL) 10 MG tablet Take 10 mg by mouth 2 (two) times daily as needed for muscle spasms.  Refills: 2    diltiazem (CARDIZEM CD) 180 MG 24 hr capsule Take 180 mg by mouth daily.    DULoxetine (CYMBALTA) 60 MG capsule Take 60 mg by mouth daily.    gabapentin (NEURONTIN) 300 MG capsule Take 300 mg by mouth 3 (three) times daily.    hydrochlorothiazide (MICROZIDE) 12.5 MG capsule Take 12.5 mg by mouth daily.    ketorolac (TORADOL) 10 MG tablet Take 10 mg by mouth every 8 (eight) hours as needed.  Refills: 1    loratadine (CLARITIN) 10 MG tablet Take 10 mg by mouth daily as needed for allergies.    metFORMIN (GLUCOPHAGE) 500 MG tablet  Take 500 mg by mouth at bedtime.    omeprazole (PRILOSEC) 20 MG capsule Take 20 mg by mouth 2 (two) times daily.    polyethylene glycol (MIRALAX / GLYCOLAX) packet Take 17 g by mouth daily.    PRESCRIPTION MEDICATION Apply 1 application topically as needed (fibromyalgia pain).    QUEtiapine (SEROQUEL) 50 MG tablet Take 50 mg by mouth 2 (two) times daily.    simvastatin (ZOCOR) 20 MG tablet Take 20 mg by mouth daily at 6 PM.  Refills: 3    traMADol (ULTRAM) 50 MG tablet Take 50 mg by mouth as needed.  Refills: 2    ferrous sulfate 325 (65 FE) MG tablet Take 1 tablet (325 mg total) by mouth 2 (two) times daily with a meal. Qty:  90 tablet, Refills: 3    HYDROcodone-acetaminophen (NORCO) 7.5-325 MG per tablet Take 1 tablet by mouth every 6 (six) hours as needed for moderate pain or severe pain. Qty: 20 tablet, Refills: 0    oxyCODONE-acetaminophen (PERCOCET/ROXICET) 5-325 MG per tablet Refills: 0       Allergies  Allergen Reactions  . Corticosteroids Rash    States she developed a rash on her tongue after a steroid injection in her back   Follow-up Information    Follow up with AVBUERE,EDWIN A, MD. Schedule an appointment as soon as possible for a visit in 1 week.   Specialty:  Internal Medicine   Contact information:   [address] [place] [address] [phone]        The results of significant diagnostics from this hospitalization (including imaging, microbiology, ancillary and laboratory) are listed below for reference.    Significant Diagnostic Studies: Ct Head (brain) Wo Contrast  06/12/2014   CLINICAL DATA:  59 year old female with a history of right-sided tingling. Dizziness. History of hypertension.  EXAM: CT HEAD WITHOUT CONTRAST  TECHNIQUE: Contiguous axial images were obtained from the base of the skull through the vertex without intravenous contrast.  COMPARISON:  03/21/2005  FINDINGS: Unremarkable appearance of the calvarium without acute fracture or aggressive lesion.  Unremarkable appearance of the scalp soft tissues.  Unremarkable appearance of the bilateral orbits.  Mastoid air cells are clear.  No significant paranasal sinus disease  No acute intracranial hemorrhage, midline shift, or mass effect.  Gray-white differentiation is maintained, without CT evidence of acute ischemia.  Unremarkable configuration of the ventricles.  IMPRESSION: No CT evidence of acute intracranial abnormality.  Signed,  Yvone Neu. Loreta Ave, DO  [facility]  [facility]   Electronically Signed   By: Gilmer Mor D.O.   On: 06/12/2014 13:19   Mr Brain Wo  Contrast  06/12/2014   CLINICAL DATA:  59 year old female with left side numbness and weakness for less than 24 hours. Initial encounter.  EXAM: MRI HEAD WITHOUT CONTRAST  TECHNIQUE: Multiplanar, multiecho pulse sequences of the brain and surrounding structures were obtained without intravenous contrast.  COMPARISON:  Head CT without contrast 1258 hours today.  FINDINGS: Cerebral volume is within normal limits for age. No restricted diffusion to suggest acute infarction. No midline shift, mass effect, evidence of mass lesion, ventriculomegaly, extra-axial collection or acute intracranial hemorrhage. Cervicomedullary junction and pituitary are within normal limits. Major intracranial vascular flow voids are preserved.  Scattered cerebral white matter T2 and FLAIR hyperintensity is Mild for age. There are superimposed dilated perivascular spaces. No chronic blood products identified. No cortical encephalomalacia identified.  Visible internal auditory structures appear normal. Visualized paranasal sinuses and mastoids  are clear. Visualized orbit soft tissues are within normal limits. Visualized scalp soft tissues are within normal limits. Normal bone marrow signal. Partially visible cervical ACDF hardware artifact.  IMPRESSION: 1.  No acute intracranial abnormality. 2. Mild for age nonspecific cerebral white matter signal changes.   Electronically Signed   By: Odessa Fleming M.D.   On: 06/12/2014 17:37   US Abdomen Complete  06/13/2014   CLINICAL DATA:  Abnormal liver enzymes and vomiting  EXAM: ULTRASOUND ABDOMEN COMPLETE  COMPARISON:  April 24, 2014  FINDINGS: Gallbladder: Surgically absent.  Common bile duct: Diameter: 5 mm. There is no intrahepatic, common hepatic, or common bile duct dilatation.  Liver: No focal lesion identified. Liver echogenicity is diffusely increased.  IVC: No abnormality visualized.  Pancreas: Visualized portion unremarkable. Portions of pancreas are obscured by gas.  Spleen: Size and  appearance within normal limits.  Right Kidney: Length: 11.0 cm. Echogenicity within normal limits. No mass or hydronephrosis visualized.  Left Kidney: Length: 10.9 cm. Echogenicity within normal limits. No mass or hydronephrosis visualized.  Abdominal aorta: No aneurysm visualized.  Other findings: No demonstrable ascites.  IMPRESSION: Gallbladder absent. Diffuse increased liver echogenicity is most likely indicative of hepatic steatosis. While no focal liver lesions are identified, it must be cautioned that the sensitivity of ultrasound for focal liver lesions is diminished in this circumstance. Portions of pancreas obscured by gas. Visualized portions of pancreas appear normal. Study otherwise unremarkable.   Electronically Signed   By: Bretta Bang III M.D.   On: 06/13/2014 08:04    Microbiology: No results found for this or any previous visit (from the past 240 hour(s)).   Labs: Basic Metabolic Panel:  Recent Labs Lab 06/12/14 1231 06/12/14 2020 06/13/14 0630 06/14/14 0450  NA 137  --  140 141  K 2.4*  --  2.7* 3.4*  CL 94*  --  97 102  CO2 28  --  30 33*  GLUCOSE 219*  --  124* 142*  BUN 10  --  9 7  CREATININE 0.96  --  0.72 0.73  CALCIUM 7.7*  --  7.2* 7.5*  MG 1.0*  --  1.4*  --   PHOS  --  2.7  --   --    Liver Function Tests:  Recent Labs Lab 06/12/14 1231 06/13/14 0630 06/14/14 0450  AST 39* 28 29  ALT 41* 35 35  ALKPHOS 87 83 80  BILITOT 0.4 0.5 0.3  PROT 7.1 6.5 6.8  ALBUMIN 3.6 3.3* 3.2*   No results for input(s): LIPASE, AMYLASE in the last 168 hours. No results for input(s): AMMONIA in the last 168 hours. CBC:  Recent Labs Lab 06/12/14 1231 06/13/14 0630  WBC 5.5 5.0  NEUTROABS 3.5  --   HGB 10.8* 10.4*  HCT 33.8* 32.8*  MCV 82.8 83.2  PLT 263 288   Cardiac Enzymes: No results for input(s): CKTOTAL, CKMB, CKMBINDEX, TROPONINI in the last 168 hours. BNP: BNP (last 3 results) No results for input(s): BNP in the last 8760 hours.  ProBNP  (last 3 results)  Recent Labs  08/17/13 0953  PROBNP 54.7    CBG:  Recent Labs Lab 06/13/14 1108 06/13/14 1625 06/13/14 2110 06/14/14 0637 06/14/14 1147  GLUCAP 160* 144* 164* 163* 151*       Signed:  Harbour Nordmeyer  [facility] 06/14/2014, 1:06 PM

## 2014-06-14 NOTE — Progress Notes (Signed)
  Echocardiogram 2D Echocardiogram has been performed.  Ximenna Fonseca FRANCES 06/14/2014, 10:06 AM

## 2014-06-15 LAB — HEMOGLOBIN A1C
Hgb A1c MFr Bld: 6.4 % — ABNORMAL HIGH (ref 4.8–5.6)
Mean Plasma Glucose: 137 mg/dL

## 2014-06-15 LAB — VITAMIN D 25 HYDROXY (VIT D DEFICIENCY, FRACTURES): Vit D, 25-Hydroxy: 6.7 ng/mL — ABNORMAL LOW (ref 30.0–100.0)

## 2014-06-19 LAB — VITAMIN D 1,25 DIHYDROXY
Vitamin D 1, 25 (OH)2 Total: 66 pg/mL
Vitamin D2 1, 25 (OH)2: 23 pg/mL
Vitamin D3 1, 25 (OH)2: 43 pg/mL

## 2014-06-22 ENCOUNTER — Ambulatory Visit (HOSPITAL_BASED_OUTPATIENT_CLINIC_OR_DEPARTMENT_OTHER): Payer: Medicare Other | Admitting: Oncology

## 2014-06-22 ENCOUNTER — Telehealth: Payer: Self-pay | Admitting: Oncology

## 2014-06-22 ENCOUNTER — Ambulatory Visit: Payer: Medicare Other | Admitting: Pharmacist

## 2014-06-22 ENCOUNTER — Other Ambulatory Visit (HOSPITAL_BASED_OUTPATIENT_CLINIC_OR_DEPARTMENT_OTHER): Payer: Medicare Other

## 2014-06-22 VITALS — BP 148/92 | HR 90 | Temp 98.4°F | Resp 18 | Ht 66.0 in | Wt 277.5 lb

## 2014-06-22 DIAGNOSIS — I82403 Acute embolism and thrombosis of unspecified deep veins of lower extremity, bilateral: Secondary | ICD-10-CM

## 2014-06-22 DIAGNOSIS — R109 Unspecified abdominal pain: Secondary | ICD-10-CM

## 2014-06-22 DIAGNOSIS — N1330 Unspecified hydronephrosis: Secondary | ICD-10-CM | POA: Diagnosis not present

## 2014-06-22 DIAGNOSIS — D649 Anemia, unspecified: Secondary | ICD-10-CM

## 2014-06-22 LAB — PROTIME-INR
INR: 3.5 (ref 2.00–3.50)
Protime: 42 s — ABNORMAL HIGH (ref 10.6–13.4)

## 2014-06-22 LAB — POCT INR: INR: 3.5

## 2014-06-22 NOTE — Progress Notes (Signed)
  [place] OFFICE PROGRESS NOTE   Diagnosis: Chronic anticoagulation  INTERVAL HISTORY:   [name] returns as scheduled. She continues Coumadin anticoagulation and is followed in the [place] anticoagulation clinic. No bleeding or symptom of thrombosis. She was admitted with right hydronephrosis in February. She underwent a right pyelogram and ureteroscopy. Blood was found in the collecting system. A biopsy of the right kidney showed mild atypia and no cancer. A right ureter stent has been removed and she is followed by Dr. Tresa Moore. She reports discomfort in the right lower abdomen since undergoing the ureter stent procedure. She was admitted in March with facial numbness. A brain MRI was negative.  [name] reports abdominal pain 5 minutes after eating. She sometimes induces emesis to relieve the pain.  Objective:  Vital signs in last 24 hours:  Blood pressure 148/92, pulse 90, temperature 98.4 F (36.9 C), temperature source Oral, resp. rate 18, height 5\' 6"  (1.676 m), weight 277 lb 8 oz (125.873 kg), SpO2 100 %.    HEENT: Neck without mass Resp: Lungs clear bilaterally Cardio: Regular rate and rhythm GI: No hepatomegaly, no mass, mild tenderness in the right lower abdomen Vascular: Trace edema at the left greater than right lower leg   Lab Results:  Lab Results  Component Value Date   WBC 5.0 06/13/2014   HGB 10.4* 06/13/2014   HCT 32.8* 06/13/2014   MCV 83.2 06/13/2014   PLT 288 06/13/2014   NEUTROABS 3.5 06/12/2014    PT/INR-3.5   Medications: I have reviewed the patient's current medications.  Assessment/Plan: 1. History of bilateral lower extremity deep vein thrombosis. She continues Coumadin and is followed in the [place] [facility].  2. History of a positive lupus anticoagulant. 3. History of a positive beta-2-glycoprotein IgA antibody. 4. Chronic bilateral leg pain - negative bilateral Doppler January 2010. 5. History of  hypertension. 6. COPD. 7. Status post hysterectomy. 8. Remote history of cocaine abuse. 9. History of an adrenal lesion on a CT scan in 2008 - status post a repeat CT on June 16, 2009, showing slow enlargement of the right adrenal nodule since 2006, most consistent with an adenoma. Stable on a CT 04/24/2014 10. History of rectal and vaginal bleeding in May 2009.  11. Status post carpal tunnel surgery. 12. 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 Right knee replacement September 2011. 13. Multiple orthopedic conditions. 14. Placement of IVC filter preoperatively before knee replacement. The IVC filter was retrieved 03/01/2011. 15. Anemia. Ferritin in normal range at 82 on 08/18/2013 16. Status post lumbar fusion surgery October 2013 17. Right hydronephrosis-evaluated by Dr. Tresa Moore   Disposition:  [name] continues Coumadin anticoagulation. She is followed at the [place] at the [facility] anticoagulation clinic. She developed significant anemia following the February 2016 hospital admission. This has improved.  She will return for an office visit here in 6 months. She will follow-up with Dr. Tresa Moore for evaluation of the right hydronephrosis and adrenal lesion. [name], MD  06/22/2014  1:39 PM

## 2014-06-22 NOTE — Telephone Encounter (Signed)
gave and printed appt sched and avs fo rpt for OCT. °

## 2014-06-22 NOTE — Progress Notes (Signed)
INR = 3.5 on Coumadin 5 mg daily except 10 mg on Tues/Thurs. Pt was admitted to [facility] 3/25 - 3/27 w/ paresthesia, hypokalemia & hypomagnesemia.  She had L sided numbness/tingling & facial drooping. INR on admission 3/25 = 4.39 She reports today that on 3/22 & 3/24 she had hematemesis.  She had diarrhea/N/V x 2 weeks prior to admission & this is cause for hypokalemia/hypomagnesemia that lead to her neurological sxs. Head CT was neg for acute changes. Med change: Cardizem now increased to 240 mg daily. No missed doses of Coumadin Only bruising she has is from IV sticks/lab draws from recent hospitalization.  No hematemesis/hematuria. Appetite "comes & goes". INR elevated today.  Asymptomatic.  She has had wide variations in her INR recently.  It is worth noting that she is requiring much lower doses of Coumadin recently than she did back this fall. She will take 2.5 mg today then decrease her Coumadin to 5 mg daily except 10 mg on Thursdays.  Pt expresses understanding of plan. Repeat INR in 2 weeks. Kennith Center, Pharm.D., CPP 06/22/2014@12 :35 PM

## 2014-06-26 IMAGING — RF DG C-ARM 61-120 MIN
1 series · 4 of 4 positions shown · non-contrast
Comparison: Lumbar MRI 11/05/2011

CLINICAL DATA: L4-5  ALIF

DG C-ARM 1-60 MIN,LUMBAR SPINE - 2-3 VIEW
TECHNIQUE: C-arm fluoroscopy images

[Series 1: run · 4 of 4 slices shown]
[im 1/4]
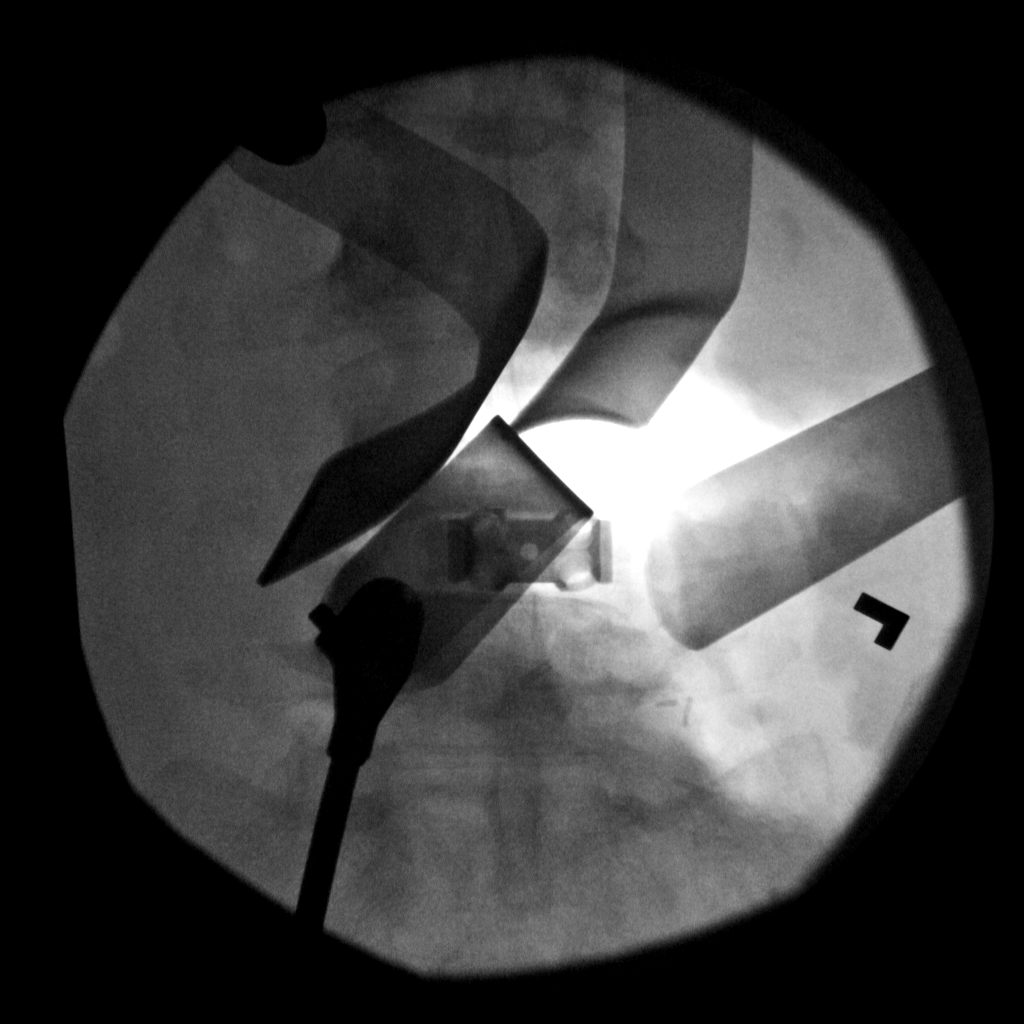
[im 2/4]
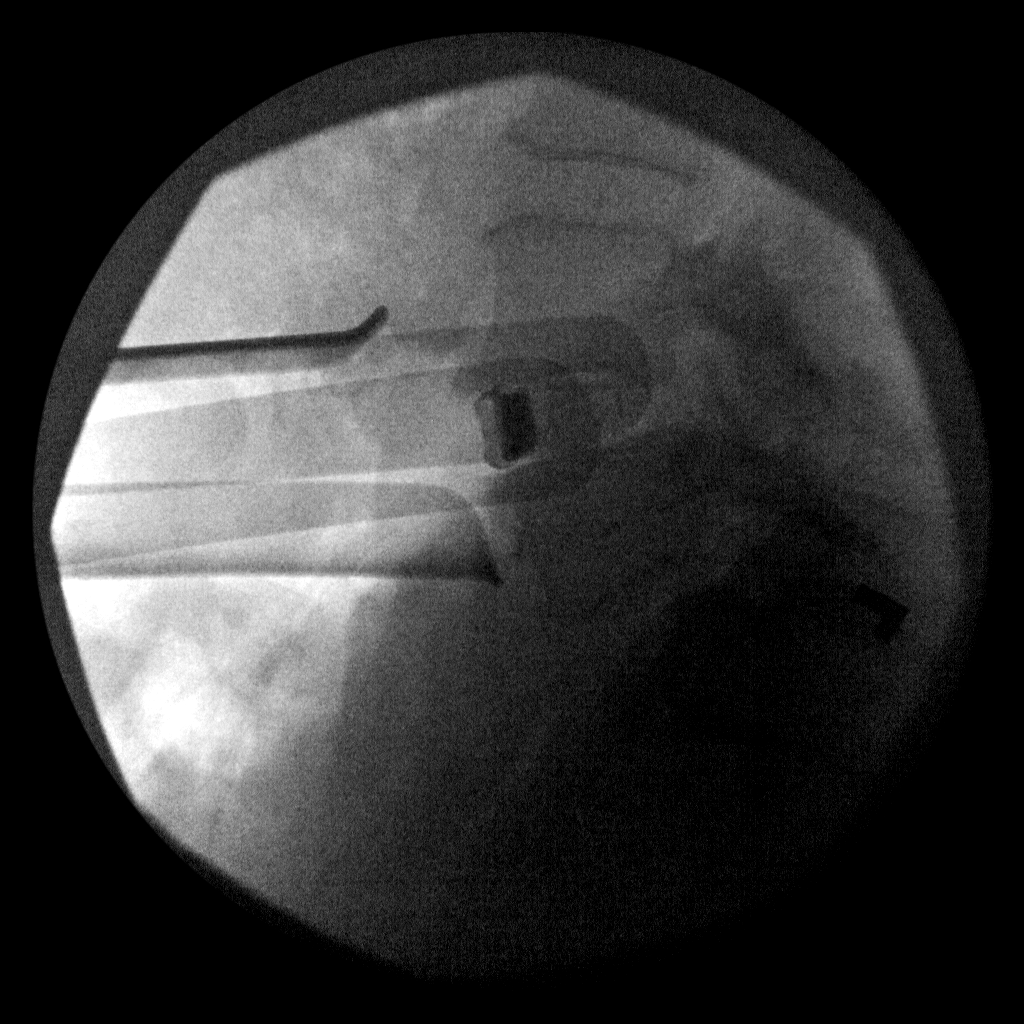
[im 3/4]
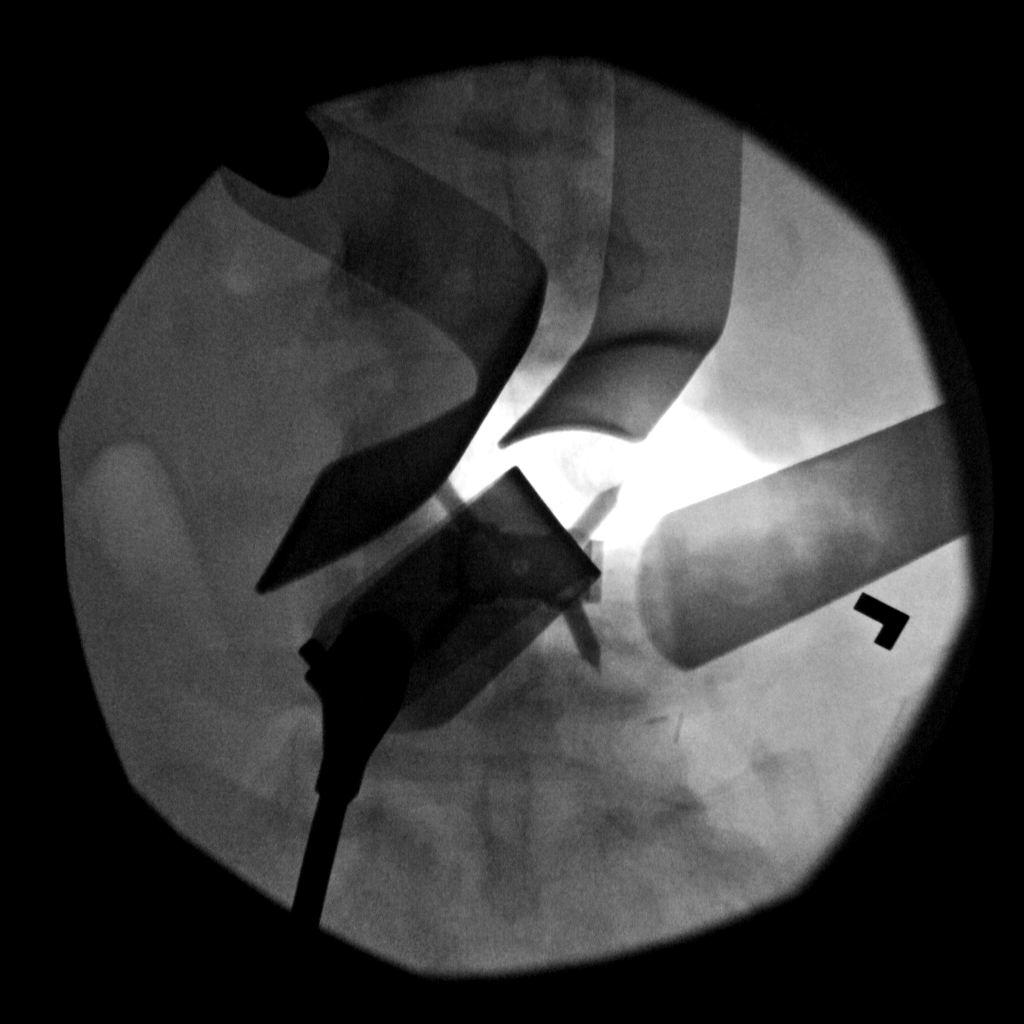
[im 4/4]
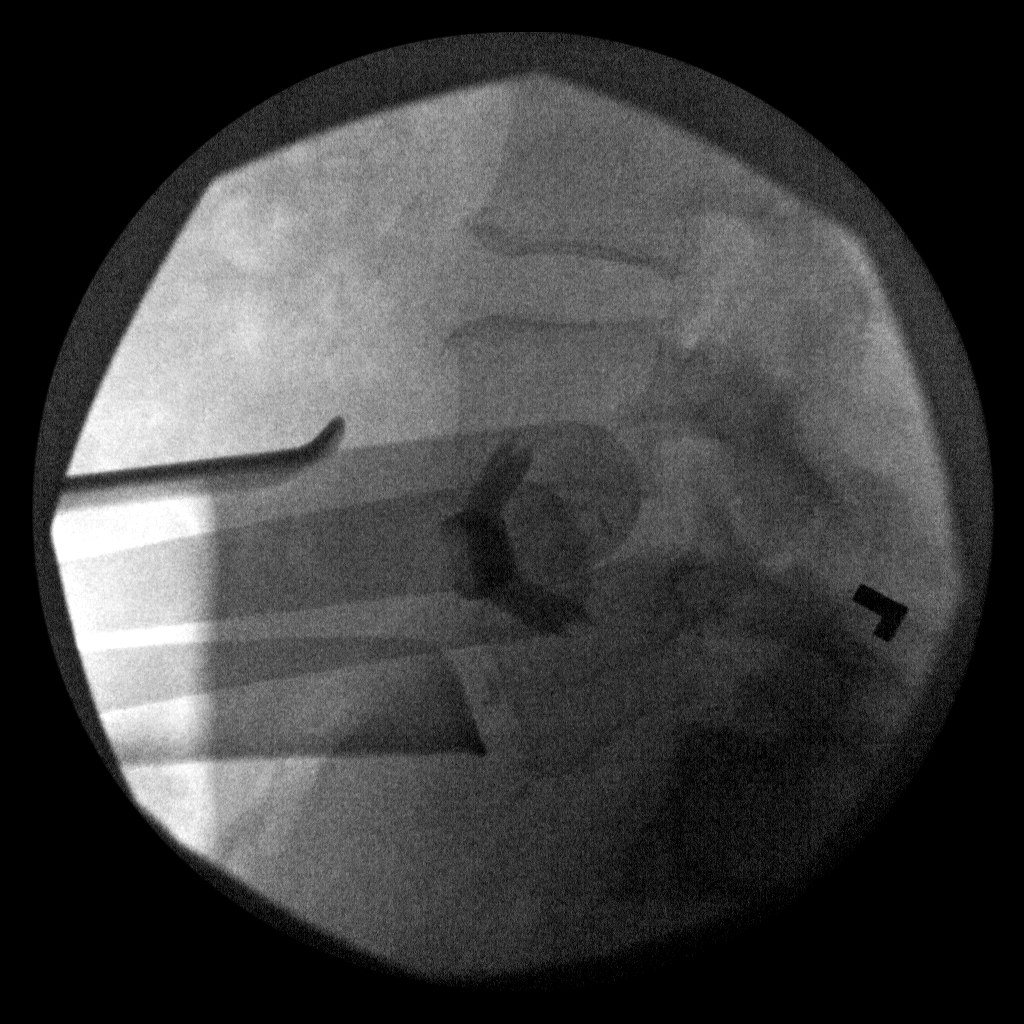

[4 of 4 positions shown; findings below may reference images not displayed]

FINDINGS: Anterior fusion at L4-5.  Anterior plate and screws and
interbody bone graft in satisfactory position  with  limited bony
detail.
IMPRESSION: ALIF  L4-5.

## 2014-06-29 ENCOUNTER — Encounter: Payer: Self-pay | Admitting: Pharmacist

## 2014-06-29 ENCOUNTER — Other Ambulatory Visit: Payer: Self-pay | Admitting: Pharmacist

## 2014-06-29 DIAGNOSIS — I82403 Acute embolism and thrombosis of unspecified deep veins of lower extremity, bilateral: Secondary | ICD-10-CM

## 2014-06-29 NOTE — Progress Notes (Signed)
[name] called coumadin clinic today stating that she has some bruising on legs and arm.  She does not recall hitting or bumping into anything.  She would like to come tomorrow to have INR checked.  I will also order a CBC.

## 2014-06-30 ENCOUNTER — Ambulatory Visit (HOSPITAL_BASED_OUTPATIENT_CLINIC_OR_DEPARTMENT_OTHER): Payer: Medicare Other | Admitting: Pharmacist

## 2014-06-30 ENCOUNTER — Other Ambulatory Visit (HOSPITAL_BASED_OUTPATIENT_CLINIC_OR_DEPARTMENT_OTHER): Payer: Medicare Other

## 2014-06-30 DIAGNOSIS — I82403 Acute embolism and thrombosis of unspecified deep veins of lower extremity, bilateral: Secondary | ICD-10-CM | POA: Diagnosis not present

## 2014-06-30 LAB — PROTIME-INR
INR: 3.1 (ref 2.00–3.50)
Protime: 37.2 Seconds — ABNORMAL HIGH (ref 10.6–13.4)

## 2014-06-30 LAB — CBC WITH DIFFERENTIAL/PLATELET
BASO%: 0.2 % (ref 0.0–2.0)
Basophils Absolute: 0 10*3/uL (ref 0.0–0.1)
EOS%: 1.6 % (ref 0.0–7.0)
Eosinophils Absolute: 0.1 10*3/uL (ref 0.0–0.5)
HCT: 36.4 % (ref 34.8–46.6)
HGB: 11.7 g/dL (ref 11.6–15.9)
LYMPH%: 26.4 % (ref 14.0–49.7)
MCH: 27 pg (ref 25.1–34.0)
MCHC: 32.1 g/dL (ref 31.5–36.0)
MCV: 84.1 fL (ref 79.5–101.0)
MONO#: 0.3 10*3/uL (ref 0.1–0.9)
MONO%: 4.8 % (ref 0.0–14.0)
NEUT#: 4.3 10*3/uL (ref 1.5–6.5)
NEUT%: 67 % (ref 38.4–76.8)
Platelets: 367 10*3/uL (ref 145–400)
RBC: 4.33 10*6/uL (ref 3.70–5.45)
RDW: 17.6 % — ABNORMAL HIGH (ref 11.2–14.5)
WBC: 6.4 10*3/uL (ref 3.9–10.3)
lymph#: 1.7 10*3/uL (ref 0.9–3.3)
nRBC: 0 % (ref 0–0)

## 2014-06-30 LAB — POCT INR: INR: 3.1

## 2014-06-30 NOTE — Patient Instructions (Addendum)
Continue 5mg  daily except 10mg  on Thursdays. Recheck INR on 07/14/14:  lab at 9:15am and coumadin clinic at 9:30am.   Call us at [phone] if bruising doesn't improve/resolve or if bruising becomes larger, darker, or increases in number.

## 2014-06-30 NOTE — Progress Notes (Addendum)
INR essentially at [place] today. CBC is within normal limits. Pt noticed a few bruises develop and wanted to have her INR checked. She noticed them on the left side of her body on her arm and near her ankle. She cannot recall any reason for them to develop. No changes in diet.  She drinks a lot of water. Decreased appetite due to nausea.  She sometimes will make herself vomit during meals to relieve the nausea. We discussed trying to eat smaller, more frequent meals to possibly help. No bleeding noted. She is taking coumadin as instructed at last visit.  No missed or extra coumadin doses. No s/s of clotting noted. Continue 5mg  daily except 10mg  on Thursdays.  Recheck INR on 07/14/14:  lab at 9:15am and coumadin clinic at 9:30am.  Call us at [phone] if bruising doesn't improve/resolve or if bruising becomes larger, darker, or increases in number.

## 2014-07-07 ENCOUNTER — Other Ambulatory Visit: Payer: Medicare Other

## 2014-07-07 ENCOUNTER — Ambulatory Visit: Payer: Medicare Other

## 2014-07-09 ENCOUNTER — Ambulatory Visit (INDEPENDENT_AMBULATORY_CARE_PROVIDER_SITE_OTHER): Payer: Medicare Other

## 2014-07-09 DIAGNOSIS — E1165 Type 2 diabetes mellitus with hyperglycemia: Secondary | ICD-10-CM

## 2014-07-09 DIAGNOSIS — M79673 Pain in unspecified foot: Secondary | ICD-10-CM | POA: Diagnosis not present

## 2014-07-09 DIAGNOSIS — B351 Tinea unguium: Secondary | ICD-10-CM

## 2014-07-09 DIAGNOSIS — IMO0002 Reserved for concepts with insufficient information to code with codable children: Secondary | ICD-10-CM

## 2014-07-09 NOTE — Progress Notes (Signed)
Presents today chief complaint of painful elongated toenails.  Objective: Pulses are palpable bilateral nails are thick, yellow dystrophic onychomycosis and painful palpation.   Assessment: Onychomycosis with pain in limb.  Plan: Treatment of nails in thickness and length as covered service secondary to pain.  

## 2014-07-14 ENCOUNTER — Other Ambulatory Visit: Payer: Medicare Other

## 2014-07-14 ENCOUNTER — Telehealth: Payer: Self-pay | Admitting: Pharmacist

## 2014-07-14 ENCOUNTER — Ambulatory Visit: Payer: Medicare Other

## 2014-07-14 NOTE — Telephone Encounter (Signed)
Pt FTKA today for lab and coumadin clinic visits. Called and left VM for patient to call back to reschedule.   Thank you,  Montel Clock, PharmD, BCOP

## 2014-07-16 IMAGING — CT CT PELVIS W/ CM
4 of 7 series · 12 of 46 positions shown, 18 images · IV contrast (READICAT & [ID] OMNI 300)
Comparison: 11/30/2011 CT myelogram.

CLINICAL DATA: Back and left leg pain.  Left lower quadrant pain.
Previous anterior lumbar fusion.

CT PELVIS WITH CONTRAST
TECHNIQUE: Multidetector CT imaging of the pelvis was performed
using the standard protocol following the bolus administration of
intravenous contrast.
Contrast: 125mL OMNIPAQUE IOHEXOL 300 MG/ML  SOLN

[Series 3: routine pelvis · axial · 0.94mm/px · z∈[-205,-65]mm · 3 of 57 slices shown, 7 images]
[im 15/57  soft-tissue]
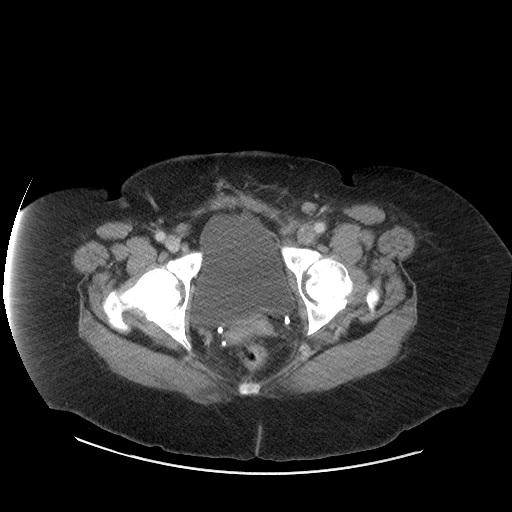
[im 15/57  lung]
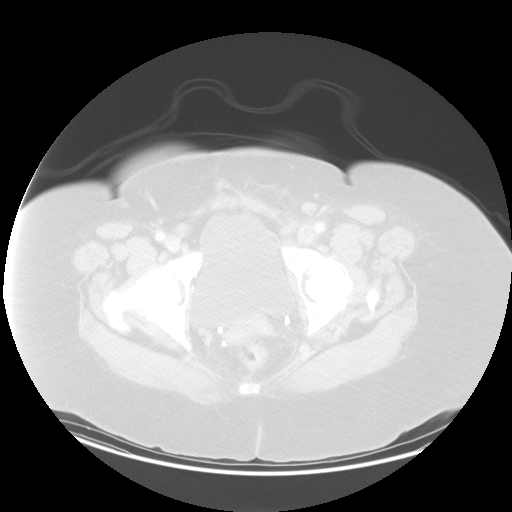
[im 15/57  bone]
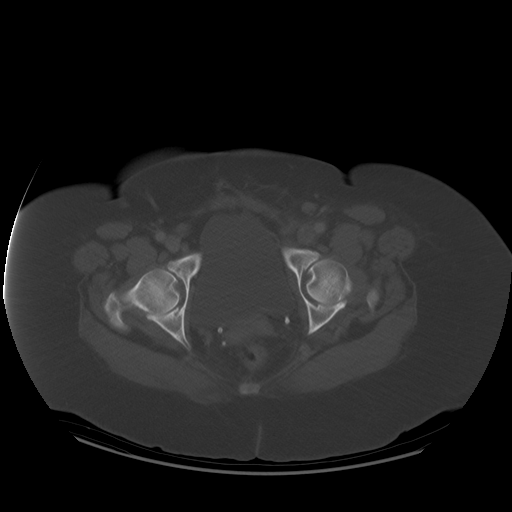
[im 29/57  soft-tissue]
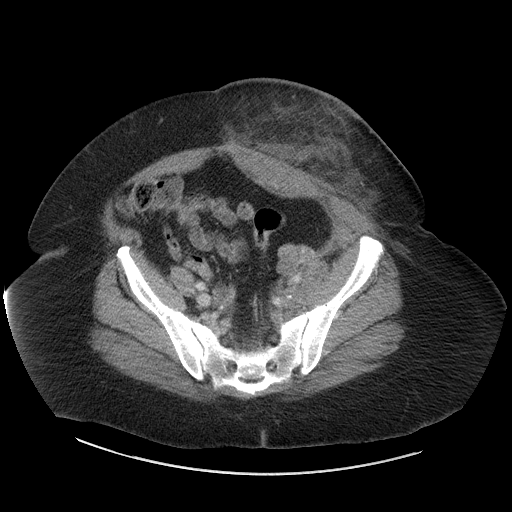
[im 29/57  lung]
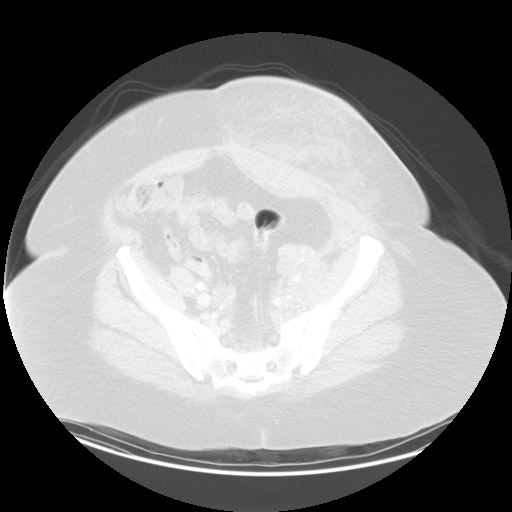
[im 43/57  soft-tissue]
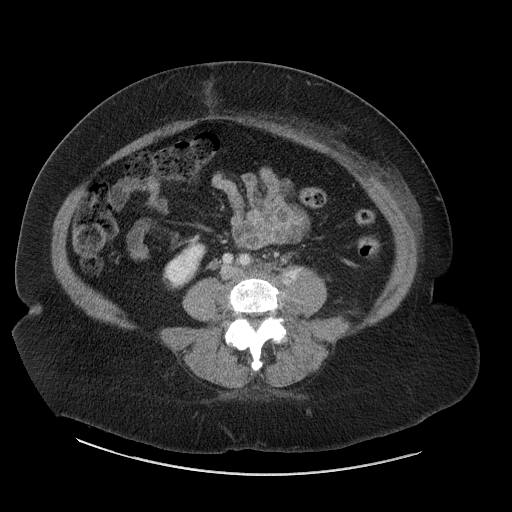
[im 43/57  lung]
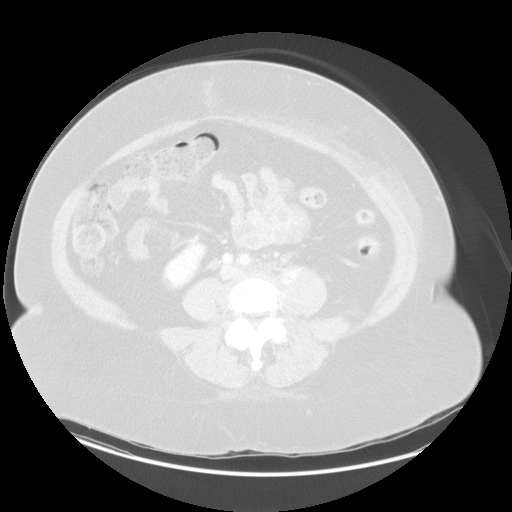

[Series 104: cor · coronal · 0.31mm/px · 3 of 56 slices shown, 4 images]
[im 19/56  soft-tissue]
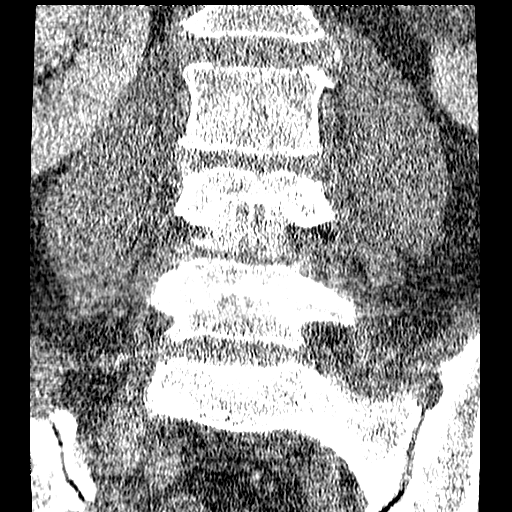
[im 25/56  soft-tissue]
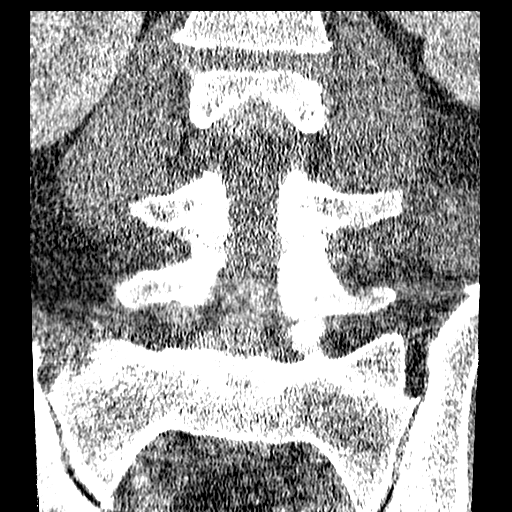
[im 25/56  bone]
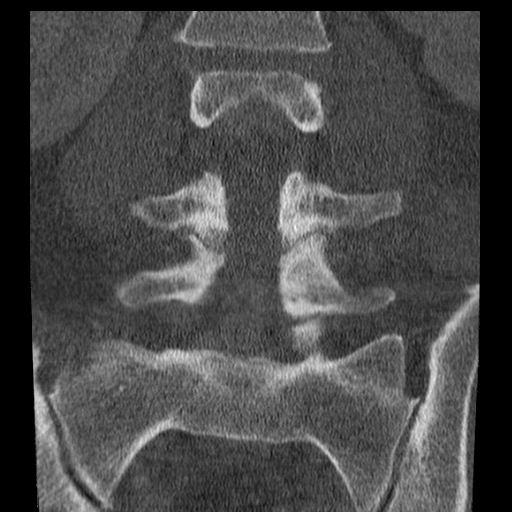
[im 31/56  soft-tissue]
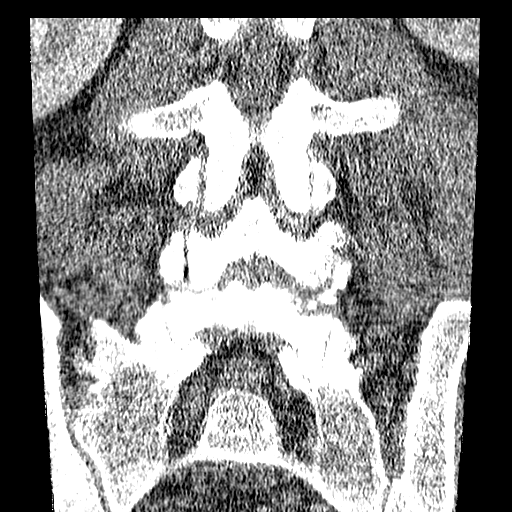

[Series 601: coronal body · coronal · 0.94mm/px · 1 of 156 slices shown, 2 images]
[im 78/156  soft-tissue]
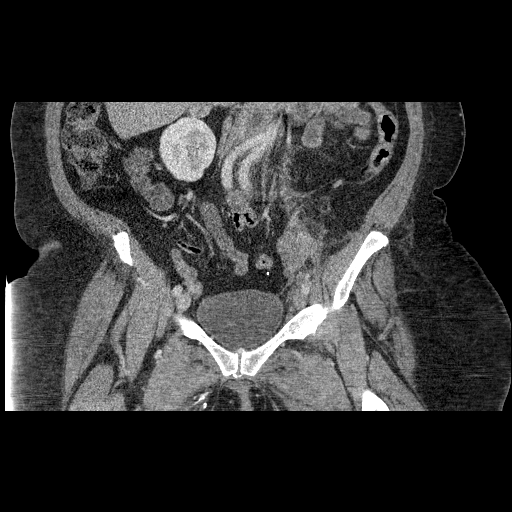
[im 78/156  bone]
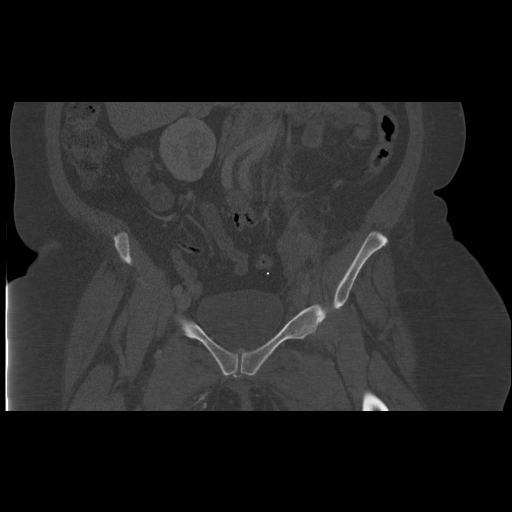

[Series 602: sagittal body · sagittal · 0.94mm/px · 5 of 192 slices shown]
[im 23/192  soft-tissue]
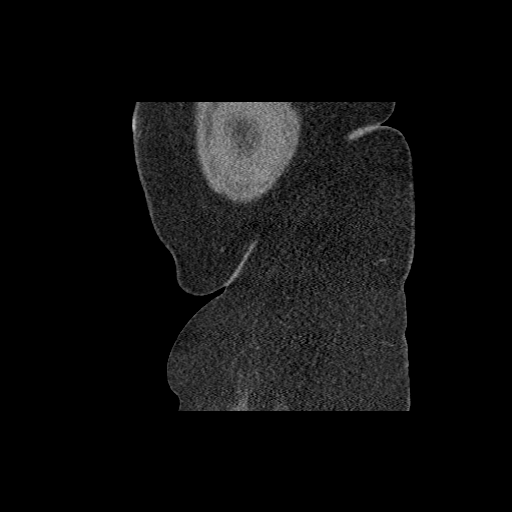
[im 45/192  soft-tissue]
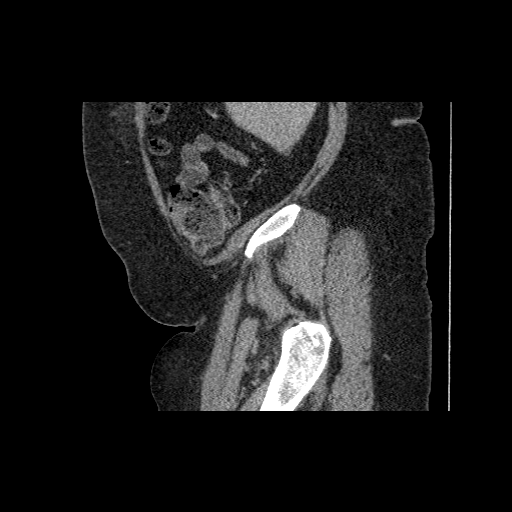
[im 68/192  soft-tissue]
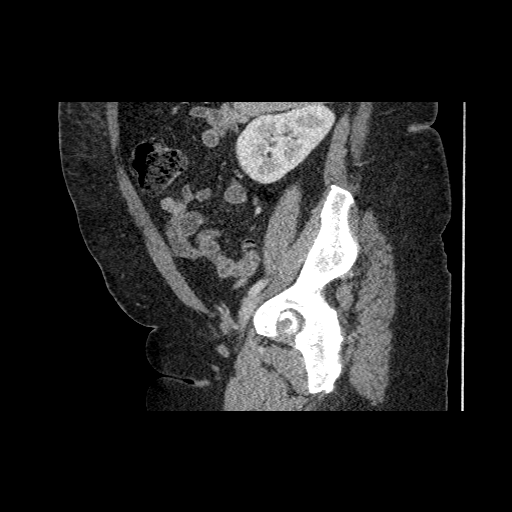
[im 90/192  soft-tissue]
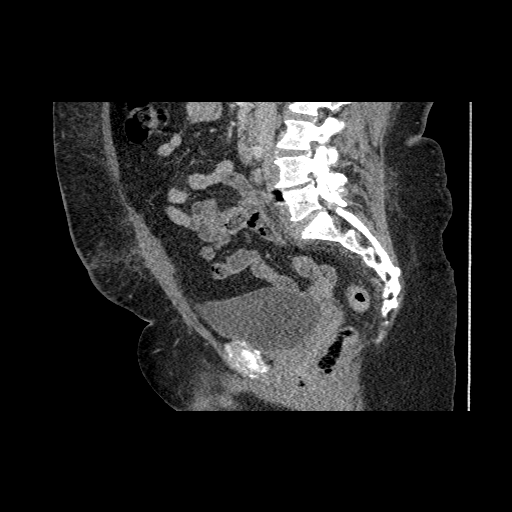
[im 113/192  soft-tissue]
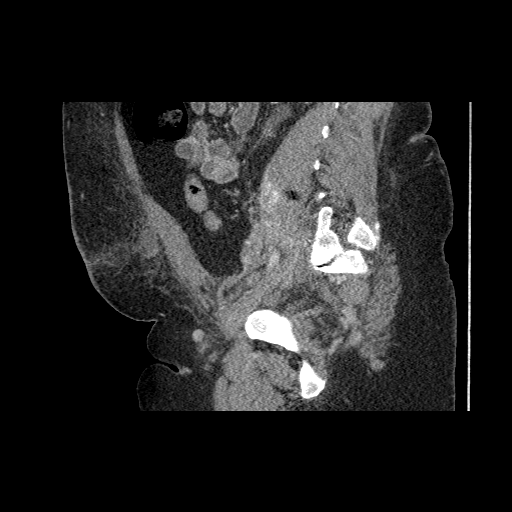

[12 of 46 positions shown; findings below may reference images not displayed]

FINDINGS: Recent left lower quadrant incision related to placement
of anterior lumbar interbody fusion with Synthes artificial disc at
L4-L5.  There is considerable inflammatory change in the left
panniculus with a focal fluid collection as seen on image 27
approximately 37 x 72 mm. Suspect developing abscess/cellulitis.

The left iliac artery is displaced anteriorly from the
retroperitoneum is compared to the right with a retro iliac 26 x 45
mm hypodense collection. Is maximal anterior to L5 on the left.
This extends cranially and caudally and is associated with some
peripheral enhancement (see image 18 series 3).  Developing abscess
suspected although resolving hematoma not excluded.  Pseudoaneurysm
is not favored.  Active extravasation felt unlikely.

Synthes anterior lumbar interbody disc L4-L5 shows slight
subsidence into the superior endplate of L5, changed from the
anterior C-arm appearance from 01/03/2012.  I believe this
represents subsidence into an osteoporotic endplate coupled with
the patient's morbid obesity.  No definite signs of infection.
Advanced facet arthropathy throughout, worst at L4-5 and L5-S1.

Hyperdense/enhancing central leftward ventral epidural soft tissue
extends cephalad from the L4-5 interspace (images 10 - 15 series 3)
upward behind L4.  Ventral epidural hematoma versus early epidural
abscess not excluded.  Possible left L4 nerve root impingement.

The left L4 screw projects outside the vertebral body into the
paraspinous soft tissues.  The right L4 screw is entirely
intraosseous.  The left L5 screw tip is minimally extraosseous.
The right L5 screw is entirely intraosseous. I cannot exclude
slight loosening of the right L5 screw at its tip (image 12 series
104, image 24 series 105.)
IMPRESSION: Superficial inflammatory change in the left panniculus with ill-
defined 37 x 72 mm fluid collection, possible abscess.

Subsidence of the artificial disc into the superior endplate of L5.
No definite signs of infection.  Slight extraosseous tip location
left L4 and left L5 screws.  Cannot exclude loosening right L5
screw tip.

Enhancing/hyperdense central and leftward ventral epidural process
extending upward behind L4; hematoma versus epidural abscess not
excluded.

Peripherally enhancing hypodense fluid collection anterior to the
vertebral body at L5, displacing the left iliac artery anteriorly.
Abscess not excluded.

## 2014-07-20 ENCOUNTER — Ambulatory Visit (HOSPITAL_BASED_OUTPATIENT_CLINIC_OR_DEPARTMENT_OTHER): Payer: Medicare Other | Admitting: Pharmacist

## 2014-07-20 ENCOUNTER — Other Ambulatory Visit (HOSPITAL_BASED_OUTPATIENT_CLINIC_OR_DEPARTMENT_OTHER): Payer: Medicare Other

## 2014-07-20 DIAGNOSIS — D649 Anemia, unspecified: Secondary | ICD-10-CM

## 2014-07-20 DIAGNOSIS — I82403 Acute embolism and thrombosis of unspecified deep veins of lower extremity, bilateral: Secondary | ICD-10-CM

## 2014-07-20 LAB — CBC WITH DIFFERENTIAL/PLATELET
BASO%: 0.2 % (ref 0.0–2.0)
Basophils Absolute: 0 10*3/uL (ref 0.0–0.1)
EOS%: 1.9 % (ref 0.0–7.0)
Eosinophils Absolute: 0.1 10*3/uL (ref 0.0–0.5)
HCT: 36.2 % (ref 34.8–46.6)
HGB: 11.7 g/dL (ref 11.6–15.9)
LYMPH%: 28 % (ref 14.0–49.7)
MCH: 26.8 pg (ref 25.1–34.0)
MCHC: 32.3 g/dL (ref 31.5–36.0)
MCV: 82.8 fL (ref 79.5–101.0)
MONO#: 0.3 10*3/uL (ref 0.1–0.9)
MONO%: 5.4 % (ref 0.0–14.0)
NEUT#: 4 10*3/uL (ref 1.5–6.5)
NEUT%: 64.5 % (ref 38.4–76.8)
Platelets: 414 10*3/uL — ABNORMAL HIGH (ref 145–400)
RBC: 4.37 10*6/uL (ref 3.70–5.45)
RDW: 17.4 % — ABNORMAL HIGH (ref 11.2–14.5)
WBC: 6.2 10*3/uL (ref 3.9–10.3)
lymph#: 1.8 10*3/uL (ref 0.9–3.3)
nRBC: 0 % (ref 0–0)

## 2014-07-20 LAB — POCT INR: INR: 1.7

## 2014-07-20 LAB — PROTIME-INR
INR: 1.7 — ABNORMAL LOW (ref 2.00–3.50)
Protime: 20.4 Seconds — ABNORMAL HIGH (ref 10.6–13.4)

## 2014-07-20 NOTE — Progress Notes (Signed)
INR below [place] today at 1.7 ([place] 2-3) pt has been stable on current dose Pt is doing well with no complaints other than fatigue No unusual bleeding or bruising Her bruising from last month has resolved No missed or extra doses No diet or medication changes Pt has dental visit for cleaning on Thursday 5/5 - her dentist is very particular about having INR close to 1 even for cleanings Plan: For dental cleaning (request of dentist) hold coumadin starting today x 3 days resume on 5/5 after dentist visit (no lovenox bridge needed).  Continue 5mg  daily except 10mg  on Thursdays. Recheck INR on 08/06/14: Lab at 9:15am and coumadin clinic at 9:30am.

## 2014-07-20 NOTE — Patient Instructions (Signed)
INR below [place] For dental cleaning (request of dentist) hold coumadin starting today x 3 days resume on 5/5 after dentist visit.  Continue 5mg  daily except 10mg  on Thursdays. Recheck INR on 08/06/14: Lab at 9:15am and coumadin clinic at 9:30am.

## 2014-07-26 IMAGING — CR DG ABDOMEN ACUTE W/ 1V CHEST
4 series · 4 of 4 positions shown · non-contrast
Comparison: Abdomen and pelvis CT, 01/29/2012

CLINICAL DATA: Lower abdominal pain, diarrhea and appetite loss

ACUTE ABDOMEN SERIES (ABDOMEN 2 VIEW & CHEST 1 VIEW)

[w chest pa]
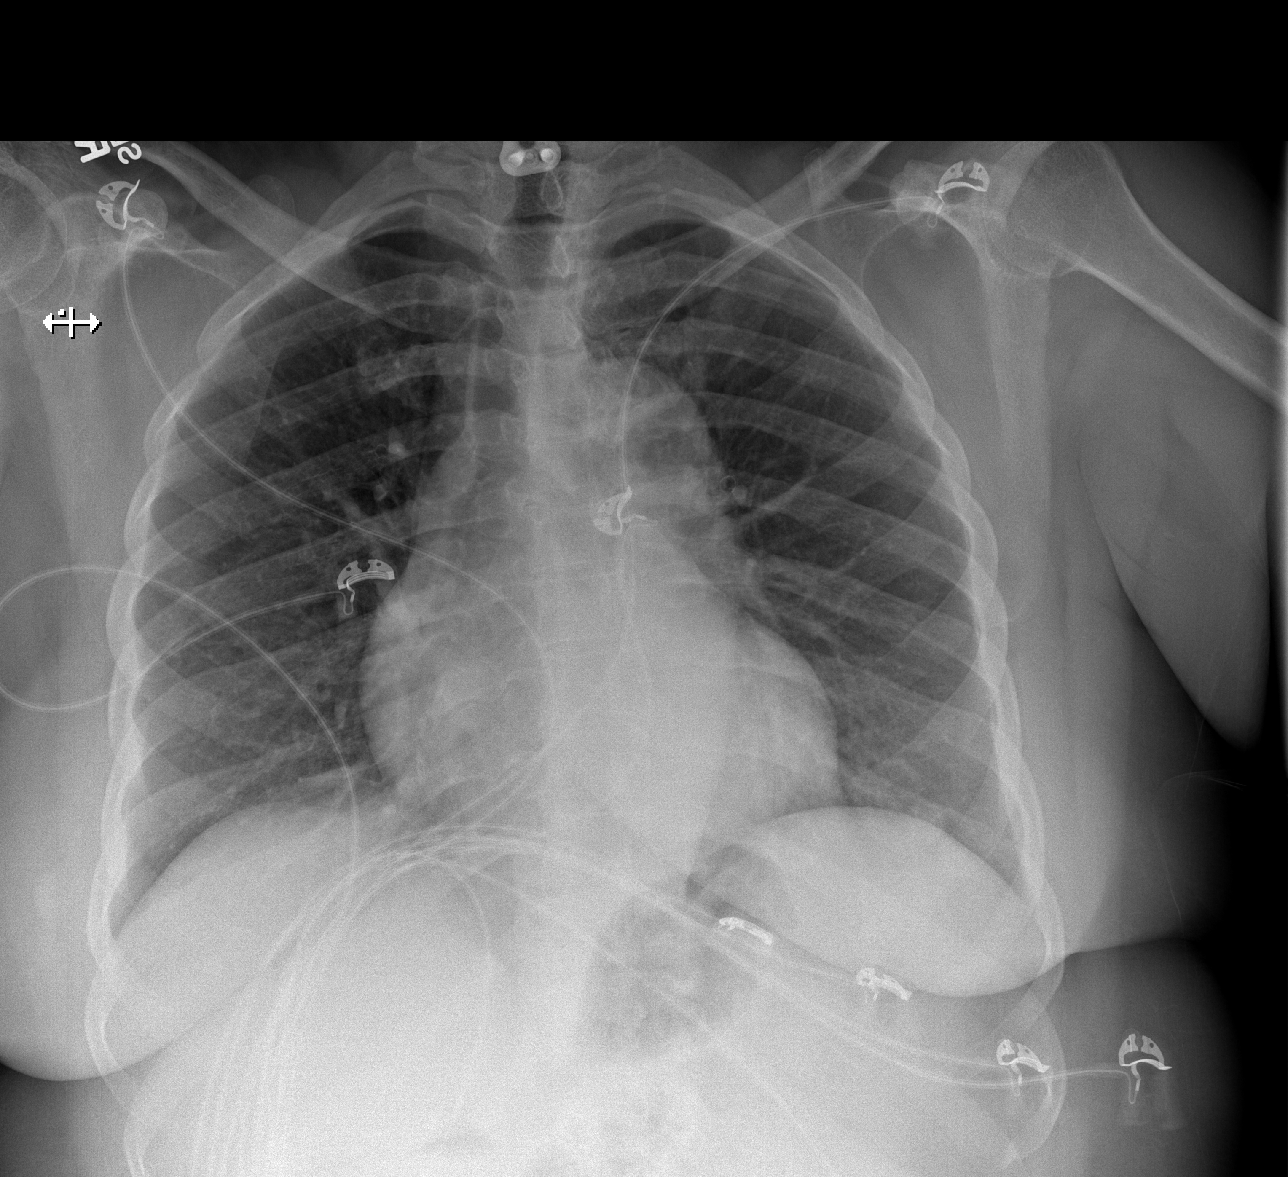

[w abdomen upright]
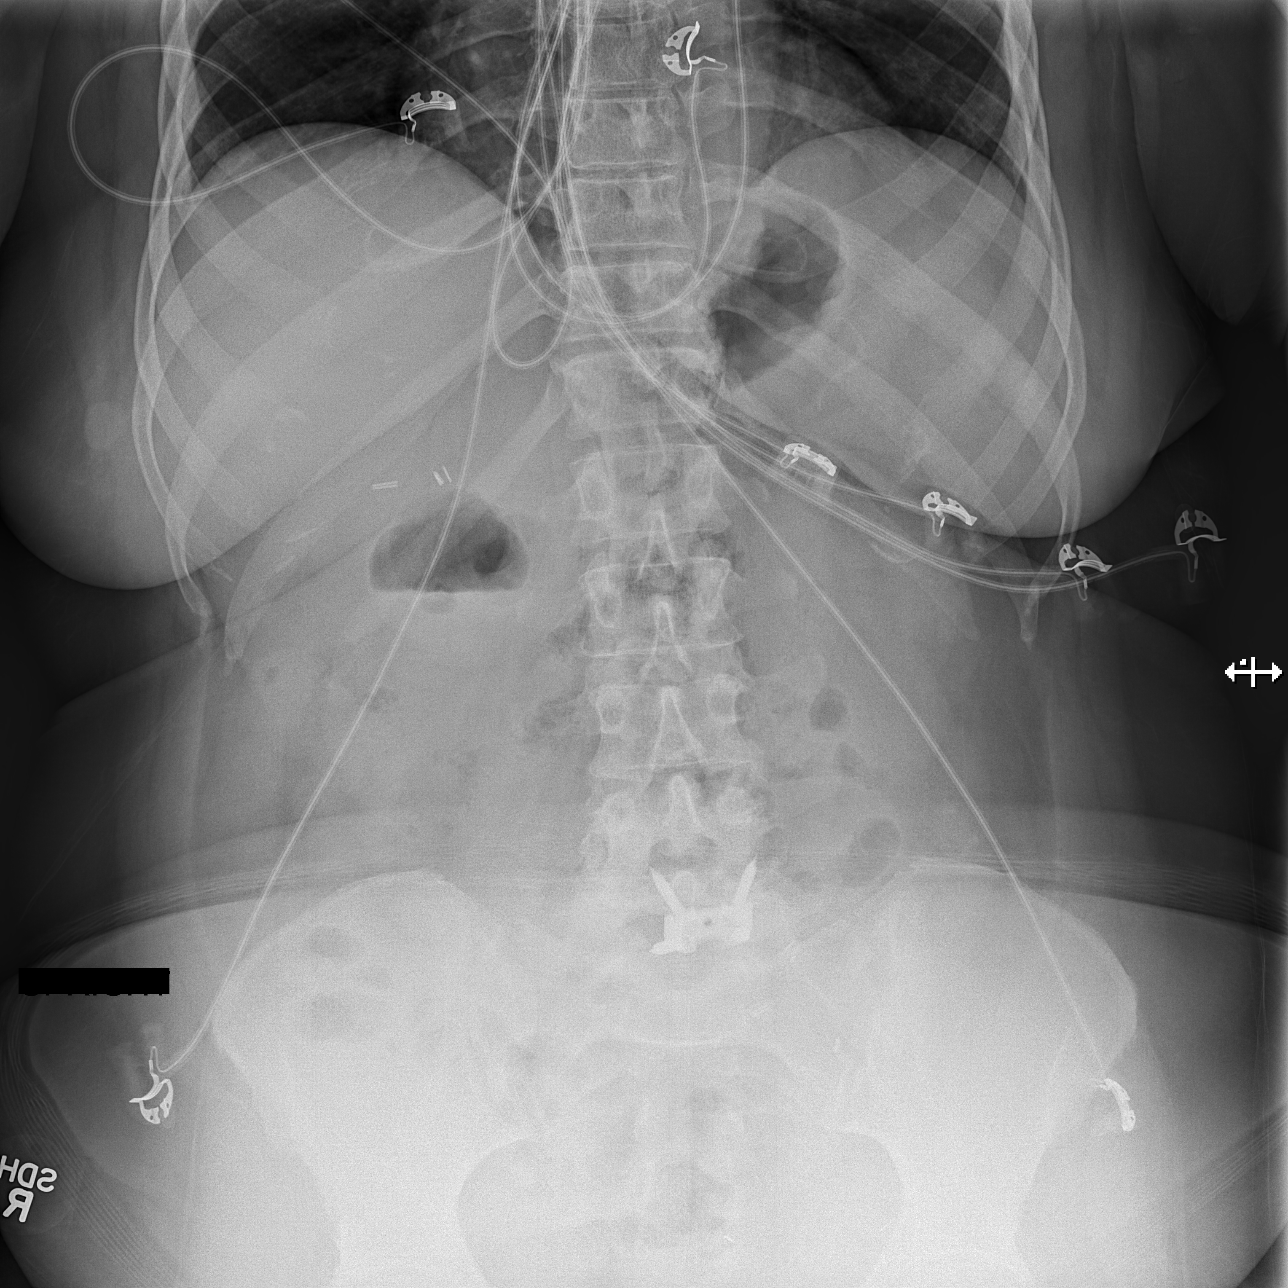

[t abdomen supine (1 of 2)]
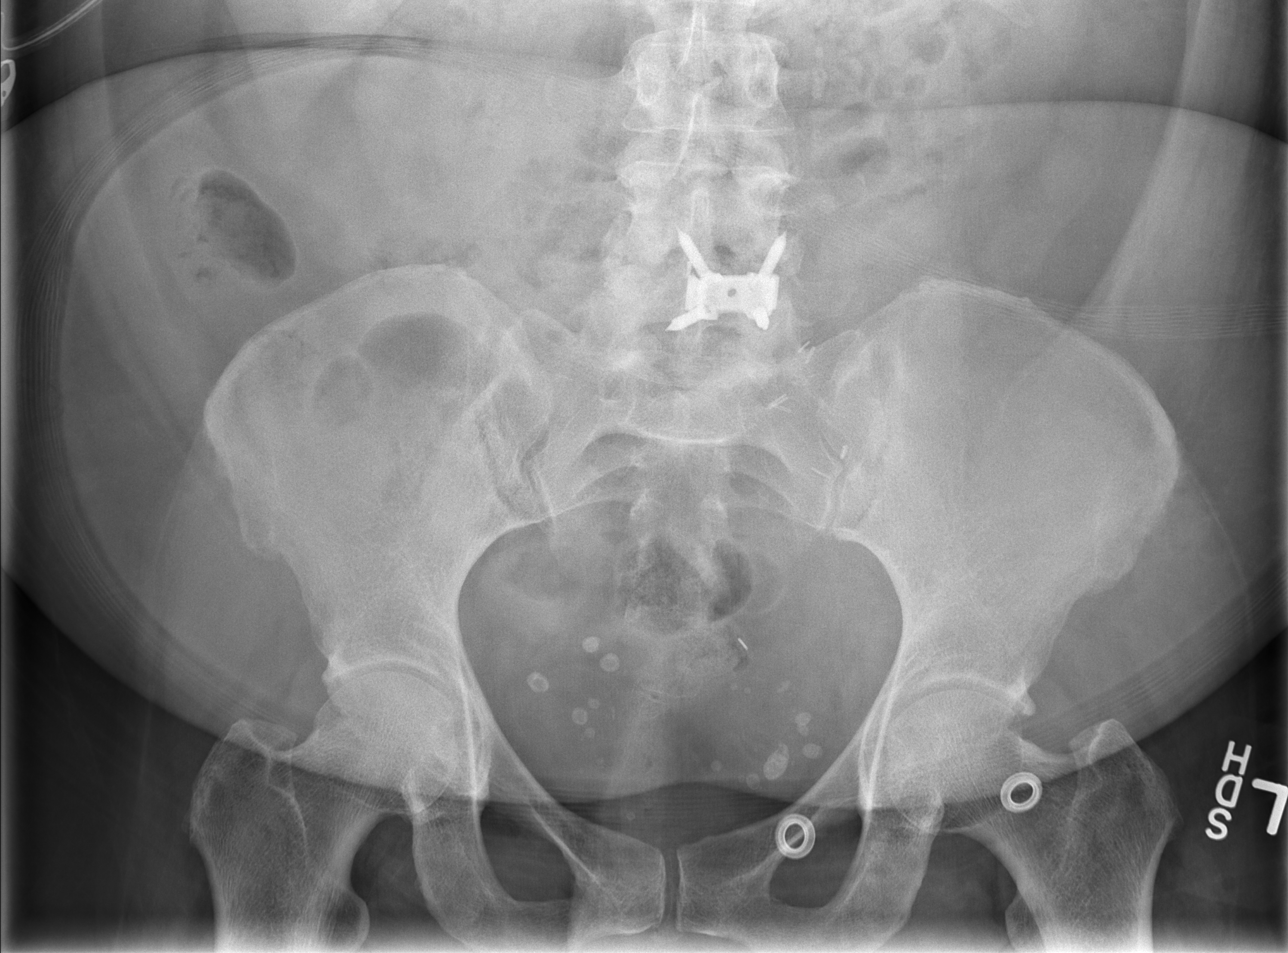

[t abdomen supine (2 of 2)]
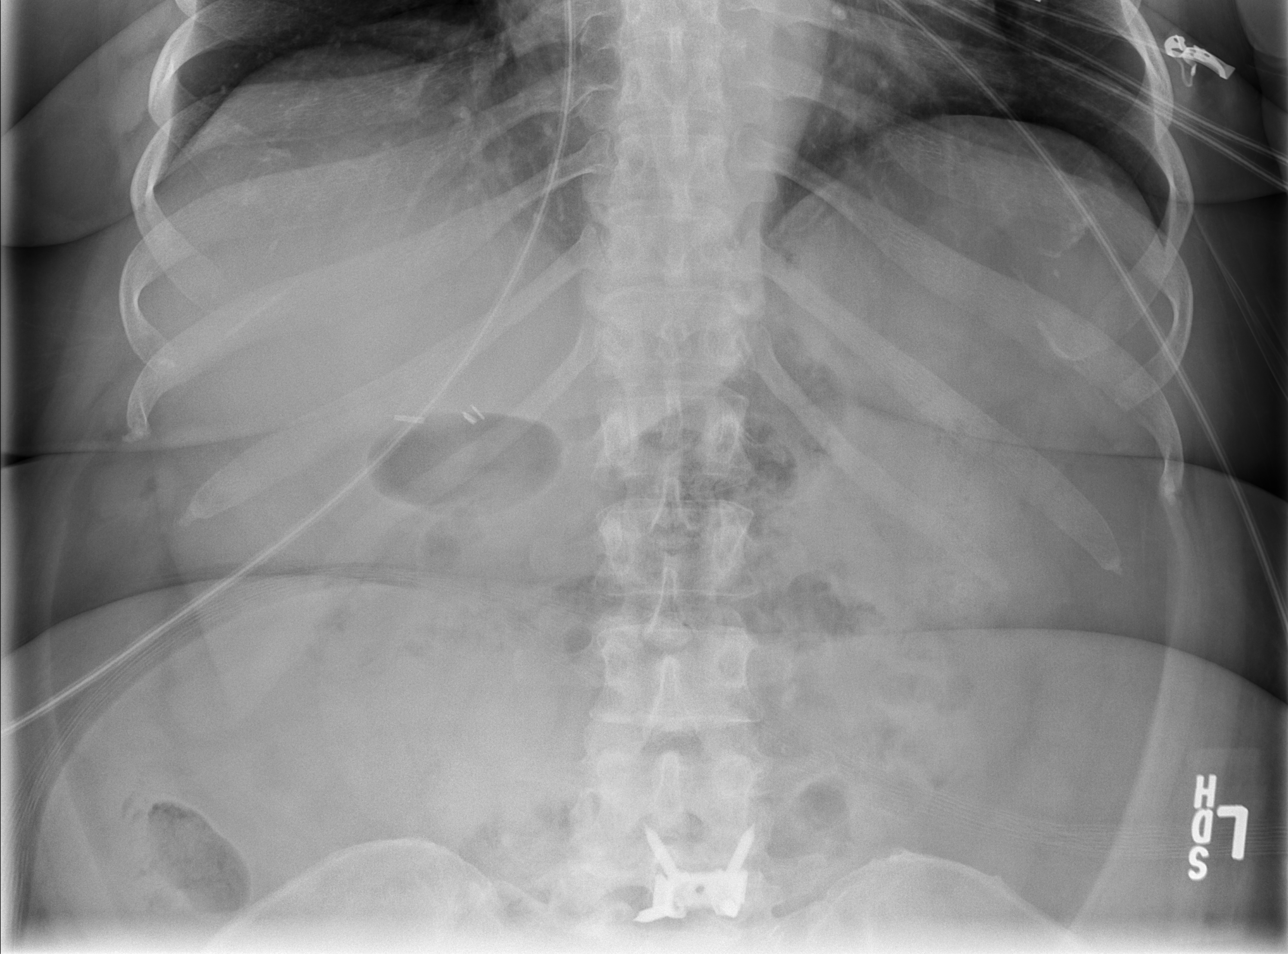

[4 of 4 positions shown; findings below may reference images not displayed]

FINDINGS: There is a single air fluid level right upper quadrant.
Otherwise, the bowel gas pattern is normal.  There is no evidence
of obstruction and no free air.  There are clips in the right upper
quadrant with prior cholecystectomy.  Multiple phleboliths are
noted in the pelvis.  The soft tissues are otherwise unremarkable.

The included frontal radiograph the chest shows clear lungs.

There are changes from the lower anterior cervical spine fusion and
a lower lumbar spine fusion.
IMPRESSION: No evidence of obstruction or free air.  No acute findings.  No
active disease on the chest radiograph.

## 2014-08-06 ENCOUNTER — Ambulatory Visit: Payer: Medicare Other

## 2014-08-06 ENCOUNTER — Other Ambulatory Visit: Payer: Medicare Other

## 2014-08-06 ENCOUNTER — Telehealth: Payer: Self-pay | Admitting: Pharmacist

## 2014-08-06 NOTE — Telephone Encounter (Signed)
Patient FTKA with Coumadin clinic today.   Called and left VM for patient to please call and reschedule.

## 2014-08-14 ENCOUNTER — Other Ambulatory Visit (HOSPITAL_BASED_OUTPATIENT_CLINIC_OR_DEPARTMENT_OTHER): Payer: Medicare Other

## 2014-08-14 ENCOUNTER — Ambulatory Visit (HOSPITAL_BASED_OUTPATIENT_CLINIC_OR_DEPARTMENT_OTHER): Payer: Self-pay | Admitting: Pharmacist

## 2014-08-14 DIAGNOSIS — I82403 Acute embolism and thrombosis of unspecified deep veins of lower extremity, bilateral: Secondary | ICD-10-CM | POA: Diagnosis not present

## 2014-08-14 LAB — PROTIME-INR
INR: 2.5 (ref 2.00–3.50)
Protime: 30 Seconds — ABNORMAL HIGH (ref 10.6–13.4)

## 2014-08-14 LAB — POCT INR: INR: 2.5

## 2014-08-14 NOTE — Patient Instructions (Signed)
Continue coumadin 5mg  daily except 10mg  on Thursdays. Recheck INR on 09/02/14; lab at 9:30am and coumadin clinic at 9:45am.

## 2014-08-14 NOTE — Progress Notes (Signed)
INR within [place] today. No problems or concerns regarding anticoagulation. Pt has been feeling tired as she has been getting up early to help take care of her mom. Pt held coumadin 5/2-5/4 in preparation for dental cleaning. She resumed coumadin on 5/5. Pt took coumadin 10mg  daily x 3 days (self prescribed) to help increase her INR. No changes in diet or medications. No unusual bruising. No bleeding noted. No s/s of clotting. No upcoming procedures. Continue coumadin 5mg  daily except 10mg  on Thursdays. Recheck INR on 09/02/14; lab at 9:30am and coumadin clinic at 9:45am.

## 2014-09-02 ENCOUNTER — Other Ambulatory Visit: Payer: Medicare Other

## 2014-09-02 ENCOUNTER — Telehealth: Payer: Self-pay | Admitting: Pharmacist

## 2014-09-02 ENCOUNTER — Ambulatory Visit: Payer: Medicare Other

## 2014-09-02 NOTE — Telephone Encounter (Signed)
Patient FTKA with Coumadin clinic today.   Left VM for her to please call and reschedule.

## 2014-09-04 ENCOUNTER — Ambulatory Visit (HOSPITAL_BASED_OUTPATIENT_CLINIC_OR_DEPARTMENT_OTHER): Payer: Medicare Other | Admitting: Pharmacist

## 2014-09-04 ENCOUNTER — Other Ambulatory Visit: Payer: Medicare Other

## 2014-09-04 ENCOUNTER — Other Ambulatory Visit: Payer: Self-pay | Admitting: Oncology

## 2014-09-04 DIAGNOSIS — I82403 Acute embolism and thrombosis of unspecified deep veins of lower extremity, bilateral: Secondary | ICD-10-CM | POA: Diagnosis not present

## 2014-09-04 LAB — POCT INR: INR: 1

## 2014-09-04 LAB — PROTIME-INR
INR: 1 — ABNORMAL LOW (ref 2.00–3.50)
Protime: 12 Seconds (ref 10.6–13.4)

## 2014-09-04 MED ORDER — WARFARIN SODIUM 5 MG PO TABS: 5.0000 mg | ORAL_TABLET | ORAL | Status: DC

## 2014-09-04 MED ORDER — WARFARIN SODIUM 10 MG PO TABS: 10.0000 mg | ORAL_TABLET | ORAL | Status: DC

## 2014-09-04 NOTE — Progress Notes (Addendum)
INR below [place] today. Pt has not had coumadin for ~ 1 wk. She ran out of her medication. No changes in diet or medications. No unusual bruising. No bleeding noted. No swelling or s/s of clotting noted. Refill on 5mg  and 10mg  tablets called to [facility] on [address]. While taking care of her mother, patient forgot to take her medicine and forgot to take care of herself. Reminded patient it is important to take care of herself also so she can help take care of her mother. Take 10mg  today. On 09/05/14, continue coumadin 5mg  daily except 10mg  on Thursdays.   Recheck INR on 09/14/14; lab at 8:30am and coumadin clinic at 8:45am.

## 2014-09-04 NOTE — Patient Instructions (Signed)
Take 10mg  today. On 09/05/14, continue coumadin 5mg  daily except 10mg  on Thursdays.   Recheck INR on 09/14/14; lab at 8:30am and coumadin clinic at 8:45am.

## 2014-09-14 ENCOUNTER — Other Ambulatory Visit (HOSPITAL_BASED_OUTPATIENT_CLINIC_OR_DEPARTMENT_OTHER): Payer: Medicare Other

## 2014-09-14 ENCOUNTER — Ambulatory Visit (HOSPITAL_BASED_OUTPATIENT_CLINIC_OR_DEPARTMENT_OTHER): Payer: Medicare Other | Admitting: Pharmacist

## 2014-09-14 DIAGNOSIS — I82403 Acute embolism and thrombosis of unspecified deep veins of lower extremity, bilateral: Secondary | ICD-10-CM

## 2014-09-14 LAB — PROTIME-INR
INR: 1.4 — ABNORMAL LOW (ref 2.00–3.50)
Protime: 16.8 Seconds — ABNORMAL HIGH (ref 10.6–13.4)

## 2014-09-14 LAB — POCT INR: INR: 1.4

## 2014-09-14 NOTE — Progress Notes (Signed)
INR below [place] again today.  No medication changes.  No bleeding/bruising.  No s/s clot.  Ms Adelsberger mother is admitted to [facility] with recurrence of her cancer.  Plan to d/c with hospice.  Ms Funderburk has not had much of an appetite.  She had resumed coumadin at usual dose of 10mg  Thurs and 5mg  other days about 7-10days ago after running out of coumadin.  Willl increase coumadin to 10mg  MWF and 5mg  other days, about a 20% dose increase, to get INR back up to [place].  Will recheck INR in 1 week.  Coumadin 10mg  x 10 tabs given: [id number] Exp 01/2015

## 2014-09-22 ENCOUNTER — Ambulatory Visit: Payer: Medicare Other

## 2014-09-22 ENCOUNTER — Other Ambulatory Visit: Payer: Medicare Other

## 2014-09-22 ENCOUNTER — Telehealth: Payer: Self-pay | Admitting: Pharmacist

## 2014-09-22 NOTE — Telephone Encounter (Signed)
LVM for patient to call back and reschedule her missed CC appmt on 7.5.16

## 2014-09-24 ENCOUNTER — Other Ambulatory Visit: Payer: Medicare Other

## 2014-09-24 ENCOUNTER — Telehealth: Payer: Self-pay

## 2014-09-24 ENCOUNTER — Ambulatory Visit: Payer: Medicare Other

## 2014-09-24 NOTE — Telephone Encounter (Signed)
[name] called our Coumadin Clinic today to reschedule her appointment. She states she has not been feeling well since last night and does not feel well enough to make it to her appointment. She reports no issues with her anticoagulation therapy at this time.  We will reschedule her to Monday, July 11 at 9 am for lab and 9:15 am for Coumadin Clinic per her request.

## 2014-09-25 DIAGNOSIS — R7989 Other specified abnormal findings of blood chemistry: Secondary | ICD-10-CM | POA: Insufficient documentation

## 2014-09-25 DIAGNOSIS — R945 Abnormal results of liver function studies: Secondary | ICD-10-CM | POA: Insufficient documentation

## 2014-09-28 ENCOUNTER — Other Ambulatory Visit: Payer: Medicare Other

## 2014-09-28 ENCOUNTER — Ambulatory Visit (HOSPITAL_BASED_OUTPATIENT_CLINIC_OR_DEPARTMENT_OTHER): Payer: Medicare Other | Admitting: Pharmacist

## 2014-09-28 DIAGNOSIS — I82403 Acute embolism and thrombosis of unspecified deep veins of lower extremity, bilateral: Secondary | ICD-10-CM | POA: Diagnosis not present

## 2014-09-28 LAB — POCT INR: INR: 4.1

## 2014-09-28 LAB — PROTIME-INR
INR: 4.1 — ABNORMAL HIGH (ref 2.00–3.50)
Protime: 49.2 Seconds — ABNORMAL HIGH (ref 10.6–13.4)

## 2014-09-28 NOTE — Progress Notes (Signed)
INR slightly above [name]. Pt took coumadin slightly different than instructed at last visit. She wanted to increase her INR. She took 10mg  daily x 3 on 6/27-6/29 and on 7/10. She took 5mg  all other days. No other changes to report today. No changes in diet or medications. No unusual bruising or bleeding. No s/s of clotting noted. No upcoming procedures. Hold coumadin today.  On 09/29/14, slightly decrease coumadin to 5mg  daily except 10mg  on Thu.  Recheck INR in 10 days on 10/08/14; lab at 9am and coumadin clinic at 9:15am.

## 2014-09-28 NOTE — Patient Instructions (Signed)
Hold coumadin today.  On 09/29/14, slightly decrease your coumadin to 5mg  daily except 10mg  on Thu.  Recheck INR in 10 days on 10/08/14; lab at 9am and coumadin clinic at 9:15am.

## 2014-10-05 ENCOUNTER — Other Ambulatory Visit: Payer: Self-pay | Admitting: Oncology

## 2014-10-08 ENCOUNTER — Ambulatory Visit: Payer: Medicare Other

## 2014-10-08 ENCOUNTER — Ambulatory Visit: Payer: Medicare Other | Admitting: Podiatry

## 2014-10-08 ENCOUNTER — Other Ambulatory Visit: Payer: Medicare Other

## 2014-10-12 ENCOUNTER — Other Ambulatory Visit (HOSPITAL_BASED_OUTPATIENT_CLINIC_OR_DEPARTMENT_OTHER): Payer: Medicare Other

## 2014-10-12 ENCOUNTER — Ambulatory Visit (HOSPITAL_BASED_OUTPATIENT_CLINIC_OR_DEPARTMENT_OTHER): Payer: Medicare Other | Admitting: Pharmacist

## 2014-10-12 DIAGNOSIS — I82403 Acute embolism and thrombosis of unspecified deep veins of lower extremity, bilateral: Secondary | ICD-10-CM

## 2014-10-12 LAB — POCT INR: INR: 2.7

## 2014-10-12 LAB — PROTIME-INR
INR: 2.7 (ref 2.00–3.50)
Protime: 32.4 Seconds — ABNORMAL HIGH (ref 10.6–13.4)

## 2014-10-12 NOTE — Patient Instructions (Signed)
INR at [place] No changes Continue coumadin  5mg  daily except 10mg  on Thu.  Recheck INR in 1 month on 11/09/14; lab at 8:30am and coumadin clinic at 8:45am.

## 2014-10-12 NOTE — Progress Notes (Signed)
INR at [place] today at 2.7 ([place] 2-3) Pt is doing well from a coumadin standpoint She is effected by her mother's health declining. Her mother is currently at [facility] place under Hospice care and is not eating. Ms. Reardon states she cries herself to sleep at night thinking about her mom. I offered her support groups or social workers to provide someone to talk to She states she is ok but was appreciative No missed or extra doses No diet or medication changes No unusual bleeding or bruising Plans: No changes Continue coumadin  5mg  daily except 10mg  on Thu.  Recheck INR in 1 month on 11/09/14; lab at 8:30am and coumadin clinic at 8:45am.

## 2014-11-03 ENCOUNTER — Ambulatory Visit: Payer: Medicare Other | Admitting: Podiatry

## 2014-11-09 ENCOUNTER — Other Ambulatory Visit (HOSPITAL_BASED_OUTPATIENT_CLINIC_OR_DEPARTMENT_OTHER): Payer: Medicare Other

## 2014-11-09 ENCOUNTER — Ambulatory Visit (HOSPITAL_BASED_OUTPATIENT_CLINIC_OR_DEPARTMENT_OTHER): Payer: Medicare Other | Admitting: Pharmacist

## 2014-11-09 DIAGNOSIS — I82403 Acute embolism and thrombosis of unspecified deep veins of lower extremity, bilateral: Secondary | ICD-10-CM | POA: Diagnosis not present

## 2014-11-09 LAB — PROTIME-INR
INR: 1.5 — ABNORMAL LOW (ref 2.00–3.50)
Protime: 18 Seconds — ABNORMAL HIGH (ref 10.6–13.4)

## 2014-11-09 LAB — POCT INR: INR: 1.5

## 2014-11-09 NOTE — Patient Instructions (Signed)
INR below [place] Take 10 mg tonight only then Continue coumadin  5mg  daily except 10mg  on Thu.  Recheck INR in 2 weeks on 11/25/14; lab at 8:30am and coumadin clinic at 8:45am.

## 2014-11-09 NOTE — Progress Notes (Signed)
INR below [place] today at 1.5 ([name] 2-3) Pt states she is doing ok today. Her mother passed away a few weeks ago on 8/10 - she was on hospice This has been difficult on [name] She says she has likely missed a few doses of coumadin in the last two weeks No other medication or diet changes No unusual bleeding or bruising or signs/symptoms of clot Plan: Take 10 mg tonight only then Continue coumadin  5mg  daily except 10mg  on Thu.  Recheck INR in 2 weeks on 11/25/14; lab at 8:30am and coumadin clinic at 8:45am.

## 2014-11-25 ENCOUNTER — Ambulatory Visit (HOSPITAL_BASED_OUTPATIENT_CLINIC_OR_DEPARTMENT_OTHER): Payer: Medicare Other | Admitting: Pharmacist

## 2014-11-25 ENCOUNTER — Other Ambulatory Visit (HOSPITAL_BASED_OUTPATIENT_CLINIC_OR_DEPARTMENT_OTHER): Payer: Medicare Other

## 2014-11-25 DIAGNOSIS — I82403 Acute embolism and thrombosis of unspecified deep veins of lower extremity, bilateral: Secondary | ICD-10-CM

## 2014-11-25 LAB — PROTIME-INR
INR: 1.4 — ABNORMAL LOW (ref 2.00–3.50)
Protime: 16.8 Seconds — ABNORMAL HIGH (ref 10.6–13.4)

## 2014-11-25 LAB — POCT INR: INR: 1.4

## 2014-11-25 NOTE — Progress Notes (Signed)
INR = 1.4 still on Coumadin 5 mg/day except 10 mg on Thurs. No missed doses that she is aware of.  She uses a pill box at home. No leg pain/swelling. She c/o being tired & sometimes has SOB. No bleeding/bruising. She has made no med changes & isn't taking OTC's/herbals/supplements. Pt states she has difficulty digesting her food sometimes & has to "bring it back up" at times.  This is not a new problem, she says. INR low again today.  We discussed increasing her dose to 5 mg/day except 10 mg on Wed & Fri.  She understands plan & will change her doses in her pill box when she gets home today. I'm afraid if we increase her dose to 10 mg three times/week, her INR will increase too much as it has in the past. Return in 2 weeks.  I will order CBC since she is fatigued & has intermittent SOB. Kennith Center, Pharm.D., CPP 11/25/2014@9 :11 AM

## 2014-12-09 ENCOUNTER — Other Ambulatory Visit: Payer: Self-pay | Admitting: *Deleted

## 2014-12-09 ENCOUNTER — Ambulatory Visit: Payer: Medicare Other

## 2014-12-09 ENCOUNTER — Telehealth: Payer: Self-pay | Admitting: Pharmacist

## 2014-12-09 ENCOUNTER — Other Ambulatory Visit: Payer: Medicare Other

## 2014-12-09 DIAGNOSIS — I82403 Acute embolism and thrombosis of unspecified deep veins of lower extremity, bilateral: Secondary | ICD-10-CM

## 2014-12-09 MED ORDER — WARFARIN SODIUM 5 MG PO TABS: 5.0000 mg | ORAL_TABLET | ORAL | Status: DC

## 2014-12-09 NOTE — Telephone Encounter (Signed)
Pt FTKA for lab/Coumadin clinic this morning.  I s/w pt over phone and she overslept.  She stated she had diarrhea last night and took some medicine and went to bed.  Her diarrhea is much better today.  There has been an intestinal virus at the building she lives in. She sees her PCP this Friday. She requested appt for lab/CC on Mon 9/26. Kennith Center, Pharm.D., CPP 12/09/2014@2 :46 PM

## 2014-12-14 ENCOUNTER — Other Ambulatory Visit (HOSPITAL_BASED_OUTPATIENT_CLINIC_OR_DEPARTMENT_OTHER): Payer: Medicare Other

## 2014-12-14 ENCOUNTER — Ambulatory Visit (HOSPITAL_BASED_OUTPATIENT_CLINIC_OR_DEPARTMENT_OTHER): Payer: Medicare Other | Admitting: Pharmacist

## 2014-12-14 DIAGNOSIS — I82403 Acute embolism and thrombosis of unspecified deep veins of lower extremity, bilateral: Secondary | ICD-10-CM

## 2014-12-14 LAB — POCT INR: INR: 1.3

## 2014-12-14 LAB — CBC WITH DIFFERENTIAL/PLATELET
BASO%: 0.2 % (ref 0.0–2.0)
Basophils Absolute: 0 10*3/uL (ref 0.0–0.1)
EOS%: 2 % (ref 0.0–7.0)
Eosinophils Absolute: 0.1 10*3/uL (ref 0.0–0.5)
HCT: 35 % (ref 34.8–46.6)
HGB: 11 g/dL — ABNORMAL LOW (ref 11.6–15.9)
LYMPH%: 35.1 % (ref 14.0–49.7)
MCH: 25.6 pg (ref 25.1–34.0)
MCHC: 31.4 g/dL — ABNORMAL LOW (ref 31.5–36.0)
MCV: 81.6 fL (ref 79.5–101.0)
MONO#: 0.4 10*3/uL (ref 0.1–0.9)
MONO%: 7.8 % (ref 0.0–14.0)
NEUT#: 3.1 10*3/uL (ref 1.5–6.5)
NEUT%: 54.9 % (ref 38.4–76.8)
Platelets: 318 10*3/uL (ref 145–400)
RBC: 4.29 10*6/uL (ref 3.70–5.45)
RDW: 17.5 % — ABNORMAL HIGH (ref 11.2–14.5)
WBC: 5.6 10*3/uL (ref 3.9–10.3)
lymph#: 2 10*3/uL (ref 0.9–3.3)
nRBC: 0 % (ref 0–0)

## 2014-12-14 LAB — PROTIME-INR
INR: 1.3 — ABNORMAL LOW (ref 2.00–3.50)
Protime: 15.6 Seconds — ABNORMAL HIGH (ref 10.6–13.4)

## 2014-12-14 MED ORDER — WARFARIN SODIUM 10 MG PO TABS: ORAL_TABLET | ORAL | Status: DC

## 2014-12-14 NOTE — Progress Notes (Signed)
INR = 1.3 on Coumadin 5 mg/day except 10 mg on Wed/Fri. Pt has been following instructions for dosing w/o deviation. She states she ate a lot of greens this past Saturday at a family dinner. Med change: off Lisinopril x 3 months due to "throat closing up." She still has some fatigue but attributes it to not walking as much as she was before her mother passed away.  She plans to start back on her walking routine soon.  CBC not drawn in lab today. Pt seeing Ned Card, NP next Monday (10/3).  I went ahead and put in another order for CBC next week. INR again is low.  Most likely due to increase in vit K in diet from greens. Take Coumadin 10 mg today then back to 5 mg/day except 10 mg on Wed/Fri. I refilled her Coumadin 10 mg tabs today.  She has enough 5 mg at home she said. Dr. Benay Spice will manage Coumadin at this point- Final visit at [place] [facility] today.  It has been a pleasure to take care of this nice lady. Kennith Center, Pharm.D., CPP 12/14/2014@9 :24 AM

## 2014-12-18 ENCOUNTER — Other Ambulatory Visit: Payer: Self-pay | Admitting: Pharmacist

## 2014-12-18 DIAGNOSIS — Z7901 Long term (current) use of anticoagulants: Secondary | ICD-10-CM

## 2014-12-21 ENCOUNTER — Telehealth: Payer: Self-pay | Admitting: Nurse Practitioner

## 2014-12-21 ENCOUNTER — Other Ambulatory Visit (HOSPITAL_BASED_OUTPATIENT_CLINIC_OR_DEPARTMENT_OTHER): Payer: Medicare Other

## 2014-12-21 ENCOUNTER — Ambulatory Visit (HOSPITAL_BASED_OUTPATIENT_CLINIC_OR_DEPARTMENT_OTHER): Payer: Medicare Other | Admitting: Nurse Practitioner

## 2014-12-21 VITALS — BP 154/90 | HR 92 | Temp 98.4°F | Resp 18

## 2014-12-21 DIAGNOSIS — Z7901 Long term (current) use of anticoagulants: Secondary | ICD-10-CM

## 2014-12-21 DIAGNOSIS — I82403 Acute embolism and thrombosis of unspecified deep veins of lower extremity, bilateral: Secondary | ICD-10-CM | POA: Diagnosis not present

## 2014-12-21 DIAGNOSIS — Z86718 Personal history of other venous thrombosis and embolism: Secondary | ICD-10-CM | POA: Diagnosis present

## 2014-12-21 LAB — CBC WITH DIFFERENTIAL/PLATELET
BASO%: 0.2 % (ref 0.0–2.0)
Basophils Absolute: 0 10*3/uL (ref 0.0–0.1)
EOS%: 1.8 % (ref 0.0–7.0)
Eosinophils Absolute: 0.1 10*3/uL (ref 0.0–0.5)
HCT: 37.1 % (ref 34.8–46.6)
HGB: 11.9 g/dL (ref 11.6–15.9)
LYMPH%: 29 % (ref 14.0–49.7)
MCH: 26 pg (ref 25.1–34.0)
MCHC: 32.1 g/dL (ref 31.5–36.0)
MCV: 81 fL (ref 79.5–101.0)
MONO#: 0.4 10*3/uL (ref 0.1–0.9)
MONO%: 6.5 % (ref 0.0–14.0)
NEUT#: 3.5 10*3/uL (ref 1.5–6.5)
NEUT%: 62.5 % (ref 38.4–76.8)
Platelets: 317 10*3/uL (ref 145–400)
RBC: 4.58 10*6/uL (ref 3.70–5.45)
RDW: 17.4 % — ABNORMAL HIGH (ref 11.2–14.5)
WBC: 5.6 10*3/uL (ref 3.9–10.3)
lymph#: 1.6 10*3/uL (ref 0.9–3.3)
nRBC: 0 % (ref 0–0)

## 2014-12-21 LAB — PROTIME-INR
INR: 2 (ref 2.00–3.50)
Protime: 24 Seconds — ABNORMAL HIGH (ref 10.6–13.4)

## 2014-12-21 NOTE — Telephone Encounter (Signed)
per pof to sch pt appt-gave pt copy of avs °

## 2014-12-21 NOTE — Progress Notes (Signed)
  [facility] OFFICE PROGRESS NOTE   Diagnosis:  Chronic anticoagulation  INTERVAL HISTORY:   [name] returns as scheduled. She continues Coumadin at a current dose of 5 mg daily except 10 mg on Wednesdays and Fridays. She denies any bleeding. No symptoms of thrombosis. Specifically no leg swelling or calf pain and no shortness of breath.  Objective:  Vital signs in last 24 hours:  Blood pressure 154/90, pulse 92, temperature 98.4 F (36.9 C), temperature source Oral, resp. rate 18, SpO2 100 %.    HEENT: No thrush or ulcers. Lymphatics: No palpable cervical or supraclavicular lymph nodes. Resp: Lungs clear bilaterally. Cardio: Regular rate and rhythm. GI: Abdomen soft and nontender. No organomegaly. Vascular: No leg edema. Calves soft and nontender.    Lab Results:  Lab Results  Component Value Date   WBC 5.6 12/21/2014   HGB 11.9 12/21/2014   HCT 37.1 12/21/2014   MCV 81.0 12/21/2014   PLT 317 12/21/2014   NEUTROABS 3.5 12/21/2014    Imaging:  No results found.  Medications: I have reviewed the patient's current medications.  Assessment/Plan: 1. History of bilateral lower extremity deep vein thrombosis. She continues Coumadin and is followed in the [place] [facility].  2. History of a positive lupus anticoagulant. 3. History of a positive beta-2-glycoprotein IgA antibody. 4. Chronic bilateral leg pain - negative bilateral Doppler January 2010. 5. History of hypertension. 6. COPD. 7. Status post hysterectomy. 8. Remote history of cocaine abuse. 9. History of an adrenal lesion on a CT scan in 2008 - status post a repeat CT on June 16, 2009, showing slow enlargement of the right adrenal nodule since 2006, most consistent with an adenoma. Stable on a CT 04/24/2014 10. History of rectal and vaginal bleeding in May 2009.  11. Status post carpal tunnel surgery. 12. 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 Right knee replacement September  2011. 13. Multiple orthopedic conditions. 14. Placement of IVC filter preoperatively before knee replacement. The IVC filter was retrieved 03/01/2011. 15. Anemia. Ferritin in normal range at 82 on 08/18/2013 16. Status post lumbar fusion surgery October 2013 17. Right hydronephrosis-evaluated by Dr. Tresa Moore   Disposition: The PT/INR is in therapeutic range. [name] will continue Coumadin at the current dose of 5 mg daily except 10 mg on Wednesdays and Fridays. She will return for a follow-up PT/INR in 2 weeks. She will return for an office visit in 6 months.    Ned Card ANP/GNP-BC   12/21/2014 12:23 PM

## 2014-12-23 ENCOUNTER — Other Ambulatory Visit: Payer: Self-pay

## 2014-12-23 DIAGNOSIS — Z1231 Encounter for screening mammogram for malignant neoplasm of breast: Secondary | ICD-10-CM

## 2014-12-30 ENCOUNTER — Encounter: Payer: Self-pay | Admitting: Podiatry

## 2014-12-30 ENCOUNTER — Ambulatory Visit (INDEPENDENT_AMBULATORY_CARE_PROVIDER_SITE_OTHER): Payer: Medicare Other | Admitting: Podiatry

## 2014-12-30 VITALS — BP 142/69 | HR 66 | Temp 96.7°F | Resp 12

## 2014-12-30 DIAGNOSIS — B351 Tinea unguium: Secondary | ICD-10-CM | POA: Diagnosis not present

## 2014-12-30 DIAGNOSIS — E119 Type 2 diabetes mellitus without complications: Secondary | ICD-10-CM

## 2014-12-30 DIAGNOSIS — M79676 Pain in unspecified toe(s): Secondary | ICD-10-CM | POA: Diagnosis not present

## 2014-12-30 NOTE — Progress Notes (Signed)
   Subjective:    Patient ID: [name], female    DOB: [birth year], 59 y.o.   MRN: [id number]  HPI This patient presents today complaining of painful toenails and requests toenail debridement. She says the toenails are uncomfortable walking wearing shoes and her last visit for a similar problem was on 12/11/2013. Patient is a known type II diabetic and has not had diabetic foot exam in over a year.  Patient denies any history of ulceration, claudication or amputation   Review of Systems  Cardiovascular: Positive for leg swelling.       Objective:   Physical Exam  Orientated 3  Vascular: No peripheral edema noted bilaterally DP and PT pulses 2/4 bilaterally Capillary reflex immediate bilaterally  Neurological: Sensation to 10 g monofilament wire intact 5/5 bilaterally Vibratory sensation reactive bilaterally Ankle reflex equal and reactive bilaterally  Dermatological: Dry skin bilaterally No open skin lesions noted bilaterally The toenails are elongated, brittle, discolored, incurvated and tender direct palpation 6-10  Musculoskeletal: There is no restriction ankle, subtalar, midtarsal joints bilaterally No deformities noted bilaterally      Assessment & Plan:   Assessment: Diabetic without complication Symptomatic onychomycoses 6-10  Plan: Debridement toenails 10 mechanic the and electrically without any bleeding  Reappoint 4 months

## 2014-12-30 NOTE — Patient Instructions (Signed)
Diabetes and Foot Care Diabetes may cause you to have problems because of poor blood supply (circulation) to your feet and legs. This may cause the skin on your feet to become thinner, break easier, and heal more slowly. Your skin may become dry, and the skin may peel and crack. You may also have nerve damage in your legs and feet causing decreased feeling in them. You may not notice minor injuries to your feet that could lead to infections or more serious problems. Taking care of your feet is one of the most important things you can do for yourself.  HOME CARE INSTRUCTIONS  Wear shoes at all times, even in the house. Do not go barefoot. Bare feet are easily injured.  Check your feet daily for blisters, cuts, and redness. If you cannot see the bottom of your feet, use a mirror or ask someone for help.  Wash your feet with warm water (do not use hot water) and mild soap. Then pat your feet and the areas between your toes until they are completely dry. Do not soak your feet as this can dry your skin.  Apply a moisturizing lotion or petroleum jelly (that does not contain alcohol and is unscented) to the skin on your feet and to dry, brittle toenails. Do not apply lotion between your toes.  Trim your toenails straight across. Do not dig under them or around the cuticle. File the edges of your nails with an emery board or nail file.  Do not cut corns or calluses or try to remove them with medicine.  Wear clean socks or stockings every day. Make sure they are not too tight. Do not wear knee-high stockings since they may decrease blood flow to your legs.  Wear shoes that fit properly and have enough cushioning. To break in new shoes, wear them for just a few hours a day. This prevents you from injuring your feet. Always look in your shoes before you put them on to be sure there are no objects inside.  Do not cross your legs. This may decrease the blood flow to your feet.  If you find a minor scrape,  cut, or break in the skin on your feet, keep it and the skin around it clean and dry. These areas may be cleansed with mild soap and water. Do not cleanse the area with peroxide, alcohol, or iodine.  When you remove an adhesive bandage, be sure not to damage the skin around it.  If you have a wound, look at it several times a day to make sure it is healing.  Do not use heating pads or hot water bottles. They may burn your skin. If you have lost feeling in your feet or legs, you may not know it is happening until it is too late.  Make sure your health care provider performs a complete foot exam at least annually or more often if you have foot problems. Report any cuts, sores, or bruises to your health care provider immediately. SEEK MEDICAL CARE IF:   You have an injury that is not healing.  You have cuts or breaks in the skin.  You have an ingrown nail.  You notice redness on your legs or feet.  You feel burning or tingling in your legs or feet.  You have pain or cramps in your legs and feet.  Your legs or feet are numb.  Your feet always feel cold. SEEK IMMEDIATE MEDICAL CARE IF:   There is increasing redness,   swelling, or pain in or around a wound.  There is a red line that goes up your leg.  Pus is coming from a wound.  You develop a fever or as directed by your health care provider.  You notice a bad smell coming from an ulcer or wound.   This information is not intended to replace advice given to you by your health care provider. Make sure you discuss any questions you have with your health care provider.   Document Released: 03/03/2000 Document Revised: 11/06/2012 Document Reviewed: 08/13/2012 Elsevier Interactive Patient Education 2016 [employer]  

## 2015-01-04 ENCOUNTER — Other Ambulatory Visit (HOSPITAL_BASED_OUTPATIENT_CLINIC_OR_DEPARTMENT_OTHER): Payer: Medicare Other

## 2015-01-04 ENCOUNTER — Ambulatory Visit: Payer: Self-pay | Admitting: *Deleted

## 2015-01-04 DIAGNOSIS — I82403 Acute embolism and thrombosis of unspecified deep veins of lower extremity, bilateral: Secondary | ICD-10-CM | POA: Diagnosis not present

## 2015-01-04 DIAGNOSIS — Z7901 Long term (current) use of anticoagulants: Secondary | ICD-10-CM

## 2015-01-04 LAB — PROTIME-INR
INR: 1.9 — ABNORMAL LOW (ref 2.00–3.50)
Protime: 22.8 Seconds — ABNORMAL HIGH (ref 10.6–13.4)

## 2015-01-04 NOTE — Progress Notes (Signed)
Called pt with instructions to continue Coumadin 5 mg daily except 10 mg on Wed and Fri. She voiced understanding. Order sent to schedulers for 3 week lab visit.

## 2015-01-05 ENCOUNTER — Telehealth: Payer: Self-pay | Admitting: Oncology

## 2015-01-05 NOTE — Telephone Encounter (Signed)
Called patient and she is aware of her appointment °

## 2015-01-12 ENCOUNTER — Ambulatory Visit
Admission: RE | Admit: 2015-01-12 | Discharge: 2015-01-12 | Disposition: A | Discharge status: Home / Self Care | Payer: Medicare Other | Source: Ambulatory Visit

## 2015-01-12 DIAGNOSIS — Z1231 Encounter for screening mammogram for malignant neoplasm of breast: Secondary | ICD-10-CM

## 2015-01-20 DIAGNOSIS — J449 Chronic obstructive pulmonary disease, unspecified: Secondary | ICD-10-CM | POA: Diagnosis not present

## 2015-01-21 ENCOUNTER — Other Ambulatory Visit: Payer: Self-pay | Admitting: *Deleted

## 2015-01-21 DIAGNOSIS — Z7901 Long term (current) use of anticoagulants: Secondary | ICD-10-CM

## 2015-01-25 ENCOUNTER — Other Ambulatory Visit (HOSPITAL_BASED_OUTPATIENT_CLINIC_OR_DEPARTMENT_OTHER): Payer: Medicare Other

## 2015-01-25 DIAGNOSIS — I82403 Acute embolism and thrombosis of unspecified deep veins of lower extremity, bilateral: Secondary | ICD-10-CM

## 2015-01-25 DIAGNOSIS — Z7901 Long term (current) use of anticoagulants: Secondary | ICD-10-CM

## 2015-01-25 LAB — PROTIME-INR
INR: 1.1 — ABNORMAL LOW (ref 2.00–3.50)
Protime: 13.2 Seconds (ref 10.6–13.4)

## 2015-01-26 ENCOUNTER — Telehealth: Payer: Self-pay | Admitting: *Deleted

## 2015-01-26 NOTE — Telephone Encounter (Signed)
Per Dr. Benay Spice; left voice message for pt to call triage/office re: is she taking her coumadin; how much each day?

## 2015-01-26 NOTE — Telephone Encounter (Signed)
-----   Message from Ladell Pier, MD sent at 01/25/2015  7:14 PM EST ----- Please call her, is she taking the coumadin?

## 2015-01-27 ENCOUNTER — Telehealth: Payer: Self-pay | Admitting: *Deleted

## 2015-01-27 ENCOUNTER — Telehealth: Payer: Self-pay | Admitting: Oncology

## 2015-01-27 DIAGNOSIS — K5900 Constipation, unspecified: Secondary | ICD-10-CM | POA: Diagnosis not present

## 2015-01-27 DIAGNOSIS — I10 Essential (primary) hypertension: Secondary | ICD-10-CM | POA: Diagnosis not present

## 2015-01-27 DIAGNOSIS — J4520 Mild intermittent asthma, uncomplicated: Secondary | ICD-10-CM | POA: Diagnosis not present

## 2015-01-27 DIAGNOSIS — Z7901 Long term (current) use of anticoagulants: Secondary | ICD-10-CM

## 2015-01-27 DIAGNOSIS — E119 Type 2 diabetes mellitus without complications: Secondary | ICD-10-CM | POA: Diagnosis not present

## 2015-01-27 NOTE — Telephone Encounter (Signed)
-----   Message from Ladell Pier, MD sent at 01/25/2015  7:14 PM EST ----- Please call her, is she taking the coumadin?

## 2015-01-27 NOTE — Telephone Encounter (Signed)
Called and left a message with lab appointment 11/16

## 2015-01-27 NOTE — Telephone Encounter (Signed)
Per Dr. Benay Spice; spoke with pt and confirmed that she is taking Coumadin 10mg  every Wed & Friday; all other days she take 5mg .  Instructions given to continue current dose and will re-check PT/INR next Wednesday.  Pt verbalized understanding of instructions and will wait to hear from schedulers for time.

## 2015-01-27 NOTE — Telephone Encounter (Signed)
Call received from patient stating "we've been trying to call about an appointment."  No appointment calls found.  This nurse informer her the INR = 1.1 on 01-25-2015.  "I know I missed two doses last week on  Monday (01-25-1015) and Tuesday (01-26-2015) because of things going on at my house.  I started new medications that may have affected blood.  Inokana started two weeks ago and Movantik started today."   Will notify Dr. Benay Spice and staff.

## 2015-01-27 NOTE — Telephone Encounter (Signed)
Left another voice message for pt to call office re: is she taking her coumadin and how much each day.

## 2015-02-02 DIAGNOSIS — F333 Major depressive disorder, recurrent, severe with psychotic symptoms: Secondary | ICD-10-CM | POA: Diagnosis not present

## 2015-02-03 ENCOUNTER — Telehealth: Payer: Self-pay | Admitting: Oncology

## 2015-02-03 ENCOUNTER — Other Ambulatory Visit (HOSPITAL_BASED_OUTPATIENT_CLINIC_OR_DEPARTMENT_OTHER): Payer: Medicare Other

## 2015-02-03 ENCOUNTER — Telehealth: Payer: Self-pay | Admitting: *Deleted

## 2015-02-03 DIAGNOSIS — Z7901 Long term (current) use of anticoagulants: Secondary | ICD-10-CM | POA: Diagnosis not present

## 2015-02-03 DIAGNOSIS — I82403 Acute embolism and thrombosis of unspecified deep veins of lower extremity, bilateral: Secondary | ICD-10-CM

## 2015-02-03 LAB — PROTIME-INR
INR: 2.6 (ref 2.00–3.50)
Protime: 31.2 Seconds — ABNORMAL HIGH (ref 10.6–13.4)

## 2015-02-03 NOTE — Telephone Encounter (Signed)
lvm for pt regarding to [place] thru April appt....mailed pta ppt sched and letter

## 2015-02-03 NOTE — Telephone Encounter (Signed)
Left message on voicemail for pt to call office. Continue same dose of Coumadin, check lab in one month- per Dr. Benay Spice. Orders entered.

## 2015-02-17 DIAGNOSIS — J449 Chronic obstructive pulmonary disease, unspecified: Secondary | ICD-10-CM | POA: Diagnosis not present

## 2015-03-03 ENCOUNTER — Telehealth: Payer: Self-pay | Admitting: Oncology

## 2015-03-03 ENCOUNTER — Other Ambulatory Visit: Payer: Medicare Other

## 2015-03-03 NOTE — Telephone Encounter (Signed)
returned call and lvm to call back to advise on when to r/s missed lab

## 2015-03-08 ENCOUNTER — Telehealth: Payer: Self-pay | Admitting: Oncology

## 2015-03-08 NOTE — Telephone Encounter (Signed)
Returned patient call re needing a lab appointment. R/s missed 12/14 lab and left message for patient re new appointment for 12/21.

## 2015-03-10 ENCOUNTER — Other Ambulatory Visit (HOSPITAL_BASED_OUTPATIENT_CLINIC_OR_DEPARTMENT_OTHER): Payer: Medicare Other

## 2015-03-10 DIAGNOSIS — Z7901 Long term (current) use of anticoagulants: Secondary | ICD-10-CM

## 2015-03-10 LAB — PROTIME-INR
INR: 1.5 — ABNORMAL LOW (ref 2.00–3.50)
Protime: 18 Seconds — ABNORMAL HIGH (ref 10.6–13.4)

## 2015-03-11 ENCOUNTER — Telehealth: Payer: Self-pay | Admitting: Oncology

## 2015-03-11 ENCOUNTER — Telehealth: Payer: Self-pay | Admitting: *Deleted

## 2015-03-11 DIAGNOSIS — Z7901 Long term (current) use of anticoagulants: Secondary | ICD-10-CM

## 2015-03-11 NOTE — Telephone Encounter (Signed)
Per Dr. Benay Spice; confirmed with pt that she take coumadin 5 mg on M,T, TH, S, S and 10 mg on W, F.  "but sometimes I have to double up and Friday's to get it right"  Instructed pt per MD to NOT double up; stay consistently on 10 mg W,F and all other days 5 mg and office will recheck labs next week.  Pt verbalized back correct instructions and expressed appreciation for call.

## 2015-03-11 NOTE — Telephone Encounter (Signed)
Spoke with patient and she is aware of her lab 12/28

## 2015-03-17 ENCOUNTER — Other Ambulatory Visit (HOSPITAL_BASED_OUTPATIENT_CLINIC_OR_DEPARTMENT_OTHER): Payer: Medicare Other

## 2015-03-17 ENCOUNTER — Encounter: Payer: Self-pay | Admitting: *Deleted

## 2015-03-17 ENCOUNTER — Telehealth: Payer: Self-pay | Admitting: *Deleted

## 2015-03-17 DIAGNOSIS — Z7901 Long term (current) use of anticoagulants: Secondary | ICD-10-CM | POA: Diagnosis not present

## 2015-03-17 DIAGNOSIS — I82403 Acute embolism and thrombosis of unspecified deep veins of lower extremity, bilateral: Secondary | ICD-10-CM

## 2015-03-17 DIAGNOSIS — J449 Chronic obstructive pulmonary disease, unspecified: Secondary | ICD-10-CM | POA: Diagnosis not present

## 2015-03-17 LAB — PROTIME-INR
INR: 3.3 (ref 2.00–3.50)
Protime: 39.6 Seconds — ABNORMAL HIGH (ref 10.6–13.4)

## 2015-03-17 NOTE — Addendum Note (Signed)
Addended by: Domenic Schwab on: 03/17/2015 02:32 PM   Modules accepted: Orders, Medications

## 2015-03-17 NOTE — Telephone Encounter (Signed)
Per Dr. Benay Spice; notified pt to stay on same coumadin, check PT in 2 weeks.  Pt verbalized understanding; confirms coumadin dose 10 mg W,F all other days 5mg .

## 2015-03-17 NOTE — Telephone Encounter (Signed)
-----   Message from Ladell Pier, MD sent at 03/17/2015  1:45 PM EST ----- Please call patient, same coumadin, check PT 2 weeks, please fix coumadin dose on med list

## 2015-03-23 ENCOUNTER — Other Ambulatory Visit: Payer: Self-pay | Admitting: Oncology

## 2015-03-31 ENCOUNTER — Other Ambulatory Visit: Payer: Medicare Other

## 2015-04-01 ENCOUNTER — Other Ambulatory Visit (HOSPITAL_COMMUNITY)
Admission: RE | Admit: 2015-04-01 | Discharge: 2015-04-01 | Disposition: A | Discharge status: Home / Self Care | Payer: Medicare Other | Source: Ambulatory Visit | Attending: Oncology | Admitting: Oncology

## 2015-04-01 ENCOUNTER — Other Ambulatory Visit: Payer: Medicaid Other

## 2015-04-01 ENCOUNTER — Telehealth: Payer: Self-pay | Admitting: Oncology

## 2015-04-01 ENCOUNTER — Telehealth: Payer: Self-pay | Admitting: *Deleted

## 2015-04-01 DIAGNOSIS — Z7901 Long term (current) use of anticoagulants: Secondary | ICD-10-CM

## 2015-04-01 DIAGNOSIS — I82403 Acute embolism and thrombosis of unspecified deep veins of lower extremity, bilateral: Secondary | ICD-10-CM | POA: Insufficient documentation

## 2015-04-01 LAB — PROTIME-INR
INR: 4.21 — ABNORMAL HIGH (ref 0.00–1.49)
Prothrombin Time: 39.5 seconds — ABNORMAL HIGH (ref 11.6–15.2)

## 2015-04-01 NOTE — Telephone Encounter (Signed)
Called patient and she is aware of her lab 1/16

## 2015-04-01 NOTE — Telephone Encounter (Signed)
Per Dr. Benay Spice; spoke with pt and confirmed she takes Coumadin 10mg  W,F and all other days take 5 mg.  Notified pt that PT/INR high today and MD wants her to hold her coumadin until Sunday then take 5 mg and re-check labs on Monday; call for any bleeding problems.  Pt verbalized understanding and expressed appreciation for call.

## 2015-04-05 ENCOUNTER — Telehealth: Payer: Self-pay | Admitting: *Deleted

## 2015-04-05 ENCOUNTER — Other Ambulatory Visit (HOSPITAL_BASED_OUTPATIENT_CLINIC_OR_DEPARTMENT_OTHER): Payer: Medicare Other

## 2015-04-05 DIAGNOSIS — Z7901 Long term (current) use of anticoagulants: Secondary | ICD-10-CM | POA: Diagnosis present

## 2015-04-05 LAB — PROTIME-INR
INR: 2.6 (ref 2.00–3.50)
Protime: 31.2 Seconds — ABNORMAL HIGH (ref 10.6–13.4)

## 2015-04-05 NOTE — Telephone Encounter (Signed)
Per Dr. Benay Spice; notified pt to stay on Coumadin 5 mg daily and will re-check lab in 10 days; schedulers will call with a time.  Pt verbalized understanding of instructions.

## 2015-04-06 ENCOUNTER — Telehealth: Payer: Self-pay | Admitting: Oncology

## 2015-04-06 NOTE — Telephone Encounter (Signed)
Left message for patient re lab for 1/26 - other appointments remain the same. Schedule mailed.

## 2015-04-15 ENCOUNTER — Other Ambulatory Visit: Payer: Medicare Other

## 2015-04-20 ENCOUNTER — Other Ambulatory Visit: Payer: Self-pay | Admitting: Oncology

## 2015-04-28 ENCOUNTER — Other Ambulatory Visit (HOSPITAL_BASED_OUTPATIENT_CLINIC_OR_DEPARTMENT_OTHER): Payer: Medicare Other

## 2015-04-28 ENCOUNTER — Telehealth: Payer: Self-pay | Admitting: *Deleted

## 2015-04-28 DIAGNOSIS — Z7901 Long term (current) use of anticoagulants: Secondary | ICD-10-CM

## 2015-04-28 LAB — PROTIME-INR
INR: 1.9 — ABNORMAL LOW (ref 2.00–3.50)
Protime: 22.8 Seconds — ABNORMAL HIGH (ref 10.6–13.4)

## 2015-04-28 NOTE — Telephone Encounter (Signed)
-----   Message from Colleen Pier, MD sent at 04/28/2015  9:25 AM EST ----- Please call patient, same coumadin,check PT 1 month

## 2015-04-28 NOTE — Telephone Encounter (Signed)
Per Dr. Benay Spice; notified pt to stay on same coumadin, check PT in 1 month.  Pt verbalized understanding and expressed appreciation for call.

## 2015-05-04 ENCOUNTER — Encounter (HOSPITAL_COMMUNITY): Payer: Self-pay | Admitting: Emergency Medicine

## 2015-05-04 ENCOUNTER — Emergency Department (HOSPITAL_COMMUNITY): Payer: Medicare Other

## 2015-05-04 ENCOUNTER — Emergency Department (HOSPITAL_COMMUNITY)
Admission: EM | Admit: 2015-05-04 | Discharge: 2015-05-04 | Disposition: A | Discharge status: Home / Self Care | Payer: Medicare Other | Attending: Emergency Medicine | Admitting: Emergency Medicine

## 2015-05-04 DIAGNOSIS — K219 Gastro-esophageal reflux disease without esophagitis: Secondary | ICD-10-CM | POA: Insufficient documentation

## 2015-05-04 DIAGNOSIS — Z86718 Personal history of other venous thrombosis and embolism: Secondary | ICD-10-CM | POA: Diagnosis not present

## 2015-05-04 DIAGNOSIS — J449 Chronic obstructive pulmonary disease, unspecified: Secondary | ICD-10-CM | POA: Insufficient documentation

## 2015-05-04 DIAGNOSIS — Z9981 Dependence on supplemental oxygen: Secondary | ICD-10-CM | POA: Diagnosis not present

## 2015-05-04 DIAGNOSIS — E119 Type 2 diabetes mellitus without complications: Secondary | ICD-10-CM | POA: Diagnosis not present

## 2015-05-04 DIAGNOSIS — M170 Bilateral primary osteoarthritis of knee: Secondary | ICD-10-CM | POA: Insufficient documentation

## 2015-05-04 DIAGNOSIS — Z7984 Long term (current) use of oral hypoglycemic drugs: Secondary | ICD-10-CM | POA: Insufficient documentation

## 2015-05-04 DIAGNOSIS — G4730 Sleep apnea, unspecified: Secondary | ICD-10-CM | POA: Insufficient documentation

## 2015-05-04 DIAGNOSIS — I10 Essential (primary) hypertension: Secondary | ICD-10-CM | POA: Insufficient documentation

## 2015-05-04 DIAGNOSIS — I951 Orthostatic hypotension: Secondary | ICD-10-CM

## 2015-05-04 DIAGNOSIS — Z79899 Other long term (current) drug therapy: Secondary | ICD-10-CM | POA: Diagnosis not present

## 2015-05-04 DIAGNOSIS — R300 Dysuria: Secondary | ICD-10-CM

## 2015-05-04 DIAGNOSIS — Z87891 Personal history of nicotine dependence: Secondary | ICD-10-CM | POA: Insufficient documentation

## 2015-05-04 DIAGNOSIS — R51 Headache: Secondary | ICD-10-CM | POA: Diagnosis present

## 2015-05-04 DIAGNOSIS — R519 Headache, unspecified: Secondary | ICD-10-CM

## 2015-05-04 DIAGNOSIS — M797 Fibromyalgia: Secondary | ICD-10-CM | POA: Insufficient documentation

## 2015-05-04 DIAGNOSIS — R011 Cardiac murmur, unspecified: Secondary | ICD-10-CM | POA: Insufficient documentation

## 2015-05-04 DIAGNOSIS — F329 Major depressive disorder, single episode, unspecified: Secondary | ICD-10-CM | POA: Diagnosis not present

## 2015-05-04 LAB — URINALYSIS, ROUTINE W REFLEX MICROSCOPIC
Glucose, UA: NEGATIVE mg/dL
Ketones, ur: NEGATIVE mg/dL
Leukocytes, UA: NEGATIVE
Nitrite: NEGATIVE
Protein, ur: NEGATIVE mg/dL
Specific Gravity, Urine: 1.027 (ref 1.005–1.030)
pH: 6 (ref 5.0–8.0)

## 2015-05-04 LAB — BASIC METABOLIC PANEL
Anion gap: 13 (ref 5–15)
BUN: 14 mg/dL (ref 6–20)
CO2: 28 mmol/L (ref 22–32)
Calcium: 8.8 mg/dL — ABNORMAL LOW (ref 8.9–10.3)
Chloride: 100 mmol/L — ABNORMAL LOW (ref 101–111)
Creatinine, Ser: 0.78 mg/dL (ref 0.44–1.00)
GFR calc Af Amer: >60 mL/min (ref >60)
GFR calc non Af Amer: >60 mL/min (ref >60)
Glucose, Bld: 170 mg/dL — ABNORMAL HIGH (ref 65–99)
Potassium: 3.1 mmol/L — ABNORMAL LOW (ref 3.5–5.1)
Sodium: 141 mmol/L (ref 135–145)

## 2015-05-04 LAB — CBC WITH DIFFERENTIAL/PLATELET
Basophils Absolute: 0 10*3/uL (ref 0.0–0.1)
Basophils Relative: 0 %
Eosinophils Absolute: 0.1 10*3/uL (ref 0.0–0.7)
Eosinophils Relative: 2 %
HCT: 35.7 % — ABNORMAL LOW (ref 36.0–46.0)
Hemoglobin: 11.5 g/dL — ABNORMAL LOW (ref 12.0–15.0)
Lymphocytes Relative: 36 %
Lymphs Abs: 2.3 10*3/uL (ref 0.7–4.0)
MCH: 26.4 pg (ref 26.0–34.0)
MCHC: 32.2 g/dL (ref 30.0–36.0)
MCV: 82.1 fL (ref 78.0–100.0)
Monocytes Absolute: 0.6 10*3/uL (ref 0.1–1.0)
Monocytes Relative: 9 %
Neutro Abs: 3.4 10*3/uL (ref 1.7–7.7)
Neutrophils Relative %: 53 %
Platelets: 301 10*3/uL (ref 150–400)
RBC: 4.35 MIL/uL (ref 3.87–5.11)
RDW: 17.9 % — ABNORMAL HIGH (ref 11.5–15.5)
WBC: 6.4 10*3/uL (ref 4.0–10.5)

## 2015-05-04 LAB — URINE MICROSCOPIC-ADD ON

## 2015-05-04 LAB — CBG MONITORING, ED: Glucose-Capillary: 143 mg/dL — ABNORMAL HIGH (ref 65–99)

## 2015-05-04 MED ORDER — METOCLOPRAMIDE HCL 5 MG/ML IJ SOLN
10.0000 mg | Freq: Once | INTRAMUSCULAR | Status: AC
  Administered 2015-05-04: 10 mg via INTRAVENOUS
  Filled 2015-05-04: qty 2

## 2015-05-04 MED ORDER — POTASSIUM CHLORIDE CRYS ER 20 MEQ PO TBCR
40.0000 meq | EXTENDED_RELEASE_TABLET | Freq: Once | ORAL | Status: AC
  Administered 2015-05-04: 40 meq via ORAL
  Filled 2015-05-04: qty 2

## 2015-05-04 MED ORDER — OXYCODONE-ACETAMINOPHEN 5-325 MG PO TABS
1.0000 | ORAL_TABLET | Freq: Once | ORAL | Status: AC
  Administered 2015-05-04: 1 via ORAL
  Filled 2015-05-04: qty 1

## 2015-05-04 MED ORDER — SODIUM CHLORIDE 0.9 % IV BOLUS (SEPSIS)
1000.0000 mL | Freq: Once | INTRAVENOUS | Status: AC
  Administered 2015-05-04: 1000 mL via INTRAVENOUS

## 2015-05-04 MED ORDER — KETOROLAC TROMETHAMINE 60 MG/2ML IM SOLN: 60.0000 mg | Freq: Once | INTRAMUSCULAR | Status: DC

## 2015-05-04 MED ORDER — SODIUM CHLORIDE 0.9 % IV SOLN
INTRAVENOUS | Status: DC
  Administered 2015-05-04: 06:00:00 via INTRAVENOUS

## 2015-05-04 MED ORDER — KETOROLAC TROMETHAMINE 30 MG/ML IJ SOLN
30.0000 mg | Freq: Once | INTRAMUSCULAR | Status: AC
  Administered 2015-05-04: 30 mg via INTRAVENOUS
  Filled 2015-05-04: qty 1

## 2015-05-04 NOTE — ED Notes (Signed)
Pt stated from waiting room that she felt like she was going to pass out. Pt brought back in triage to check vitals. BP lower than prior. Following commands. Grip strengths equal. Face symmetrical.

## 2015-05-04 NOTE — ED Notes (Signed)
NT assisted patient to bathroom. Pt unable to void at this time. Pt informed the need to have I/O.  She asked to allow her a few minutes before she is to have I/O.

## 2015-05-04 NOTE — ED Provider Notes (Signed)
CSN: [id number]     Arrival date & time 05/04/15  0124 History   First MD Initiated Contact with Patient 05/04/15 0241     Chief Complaint  Patient presents with  . Headache     (Consider location/radiation/quality/duration/timing/severity/associated sxs/prior Treatment) HPI   Headache started 2 weeks ago which has been off and on in quality. Sharp pains to the front left scalp. The pain worsened this evening and she was unable to sleep because the pain was so severe. While sitting in the lobby this evening she started to feel weak and as though she was going to pass out. She took Tramadol for the headache but it didn't help. She endorses  photophobia and phonophobia. Reports weak all over, no focal weakness or change in vision. Pt reports she did not want to come but her children made her come to the ER She also complains of some mild dysuria.  ROS: The patient denies diaphoresis, fever, ( focal), confusion, change of vision,  dysphagia, aphagia, shortness of breath,  abdominal pains, nausea, vomiting, diarrhea, lower extremity swelling, rash, neck pain, chest pain   Past Medical History  Diagnosis Date  . DVT (deep venous thrombosis) (HCC)     Recurrent  . Adrenal mass ([place]) 03/226    Benign  . Lupus Anticoagulant Positive   . Clotting disorder (HCC)     +beta-2-glycoprotein IgA antibody  . History of cocaine abuse     Remote history   . Heart murmur   . Hypertension     takes meds daily  . Depression   . Shortness of breath     exertion or lying flat  . COPD (chronic obstructive pulmonary disease) (HCC)     inhalers dependent on environment  . Sleep apnea     2l of oxygen at night  . On home oxygen therapy     at night  . Diabetes mellitus     120s usually fasting  . GERD (gastroesophageal reflux disease)   . Dizziness, nonspecific   . Arthritis     knees/multiple orthopedic conditons; lower back  . History of blood transfusion   . Fibromyalgia   . Asthma     per  pt   Past Surgical History  Procedure Laterality Date  . Carpal tunnel release      Bilateral  . Abdominal hysterectomy  1989    Fibroids  . Back surgery      cervical spine---disk disease  . Cesarean section    . Replacement total knee  11/17/2008    bilateral  . Right elbow surgery    . Tubal ligation    . Gallstones removed    . Anterior lumbar fusion  01/03/2012    Procedure: ANTERIOR LUMBAR FUSION 1 LEVEL;  Surgeon: Winfield Cunas, MD;  Location: [facility];  Service: Neurosurgery;  Laterality: N/A;  Lumbar Four-Five Anterior Lumbar Interbody Fusion with Instrumentation  . Cystoscopy/retrograde/ureteroscopy/stone extraction with basket Right 04/26/2014    Procedure: CYSTOSCOPY/ RIGHT RETROGRADE/ RIGHT URETEROSCOPY/URETERAL AND RENAL PELVIS BIOPSY;  Surgeon: Alexis Frock, MD;  Location: [facility];  Service: Urology;  Laterality: Right;   Family History  Problem Relation Age of Onset  . Hypertension Father   . Heart failure Father   . Hypertension Mother   . Diabetes Mother   . Cancer Mother     Pancreatic   Social History  Substance Use Topics  . Smoking status: Former Smoker    Start date: 03/20/2001    Quit  date: 03/21/2003  . Smokeless tobacco: Never Used     Comment: smoked for 2 years  . Alcohol Use: No     Comment: beer in past    OB History    No data available     Review of Systems  Review of Systems All other systems negative except as documented in the HPI. All pertinent positives and negatives as reviewed in the HPI.   Allergies  Lisinopril and Corticosteroids  Home Medications   Prior to Admission medications   Medication Sig Start Date End Date Taking? Authorizing Provider  albuterol (PROVENTIL HFA;VENTOLIN HFA) 108 (90 BASE) MCG/ACT inhaler Inhale 2 puffs into the lungs every 4 (four) hours as needed for shortness of breath.   Yes Historical Provider, MD  albuterol (PROVENTIL) (2.5 MG/3ML) 0.083% nebulizer solution Take 2.5 mg by nebulization  every 6 (six) hours as needed for wheezing or shortness of breath.   Yes Historical Provider, MD  amLODipine (NORVASC) 5 MG tablet Take 5 mg by mouth daily.   Yes Historical Provider, MD  canagliflozin (INVOKANA) 100 MG TABS tablet Take 100 mg by mouth daily.   Yes Historical Provider, MD  CARTIA XT 240 MG 24 hr capsule Take 240 mg by mouth daily. 06/17/14  Yes Historical Provider, MD  cloNIDine (CATAPRES) 0.1 MG tablet Take 0.1 mg by mouth 2 (two) times daily.   Yes Historical Provider, MD  cyclobenzaprine (FLEXERIL) 10 MG tablet Take 10 mg by mouth 2 (two) times daily as needed for muscle spasms.  06/05/14  Yes Historical Provider, MD  DULoxetine (CYMBALTA) 20 MG capsule Take 20 mg by mouth at bedtime.   Yes Historical Provider, MD  DULoxetine (CYMBALTA) 60 MG capsule Take 60 mg by mouth daily.   Yes Historical Provider, MD  gabapentin (NEURONTIN) 300 MG capsule Take 300 mg by mouth 3 (three) times daily.   Yes Historical Provider, MD  hydrochlorothiazide (HYDRODIURIL) 12.5 MG tablet Take 1 tablet by mouth daily. 03/22/15  Yes Historical Provider, MD  Linaclotide Rolan Lipa) 145 MCG CAPS capsule Take 145 mcg by mouth daily.   Yes Historical Provider, MD  loratadine (CLARITIN) 10 MG tablet Take 10 mg by mouth daily as needed for allergies.   Yes Historical Provider, MD  metFORMIN (GLUCOPHAGE) 500 MG tablet Take 500 mg by mouth at bedtime.   Yes Historical Provider, MD  omeprazole (PRILOSEC) 20 MG capsule Take 20 mg by mouth 2 (two) times daily.   Yes Historical Provider, MD  polyethylene glycol powder (GLYCOLAX/MIRALAX) powder Take by mouth daily.  06/09/14  Yes Historical Provider, MD  PRESCRIPTION MEDICATION Apply 1 application topically as needed (fibromyalgia pain).   Yes Historical Provider, MD  QUEtiapine (SEROQUEL) 50 MG tablet Take 50 mg by mouth 2 (two) times daily.   Yes Historical Provider, MD  simvastatin (ZOCOR) 20 MG tablet Take 20 mg by mouth daily at 6 PM.  03/23/14  Yes Historical Provider,  MD  traMADol (ULTRAM) 50 MG tablet Take 50 mg by mouth as needed for moderate pain.  06/05/14  Yes Historical Provider, MD  warfarin (COUMADIN) 5 MG tablet TAKE AS DIRECTED 04/20/15  Yes Ladell Pier, MD  HYDROcodone-acetaminophen (NORCO) 7.5-325 MG per tablet Take 1 tablet by mouth every 6 (six) hours as needed for moderate pain or severe pain. Patient not taking: Reported on 05/04/2015 04/30/14   Modena Jansky, MD   BP 129/79 mmHg  Pulse 71  Temp(Src) 98 F (36.7 C) (Oral)  Resp 18  SpO2 92%  Physical Exam  Constitutional: She appears well-developed and well-nourished. No distress.  HENT:  Head: Normocephalic and atraumatic.  Right Ear: Tympanic membrane and ear canal normal.  Left Ear: Tympanic membrane and ear canal normal.  Nose: Nose normal.  Mouth/Throat: Uvula is midline, oropharynx is clear and moist and mucous membranes are normal.  Eyes: Pupils are equal, round, and reactive to light.  Neck: Normal range of motion. Neck supple.  Cardiovascular: Normal rate and regular rhythm.   Pulmonary/Chest: Effort normal.  Abdominal: Soft.  No signs of abdominal distention  Musculoskeletal:  No LE swelling  Neurological: She is alert.  Acting at baseline Cranial nerves grossly intact on exam. Pt alert and oriented x 3 Upper and lower extremity strength is symmetrical and physiologic Normal muscular tone No facial droop Coordination intact, no limb ataxia,No pronator drift   Skin: Skin is warm and dry. No rash noted.  Nursing note and vitals reviewed.   ED Course  Procedures (including critical care time) Labs Review Labs Reviewed  CBC WITH DIFFERENTIAL/PLATELET - Abnormal; Notable for the following:    Hemoglobin 11.5 (*)    HCT 35.7 (*)    RDW 17.9 (*)    All other components within normal limits  BASIC METABOLIC PANEL - Abnormal; Notable for the following:    Potassium 3.1 (*)    Chloride 100 (*)    Glucose, Bld 170 (*)    Calcium 8.8 (*)    All other  components within normal limits  CBG MONITORING, ED - Abnormal; Notable for the following:    Glucose-Capillary 143 (*)    All other components within normal limits  URINALYSIS, ROUTINE W REFLEX MICROSCOPIC (NOT AT [facility])    Imaging Review Ct Head Wo Contrast  05/04/2015  CLINICAL DATA:  Headache for 2 weeks, worsening yesterday. History of hypertension diabetes, lupus, cocaine abuse, fibromyalgia. EXAM: CT HEAD WITHOUT CONTRAST TECHNIQUE: Contiguous axial images were obtained from the base of the skull through the vertex without intravenous contrast. COMPARISON:  MRI of the head June 12, 2014 FINDINGS: The ventricles and sulci are normal. No intraparenchymal hemorrhage, mass effect nor midline shift. No acute large vascular territory infarcts. Mild white matter changes. No abnormal extra-axial fluid collections. Basal cisterns are patent. No skull fracture. The included ocular globes and orbital contents are non-suspicious. The mastoid aircells and included paranasal sinuses are well-aerated. IMPRESSION: No acute intracranial process. Mild chronic small vessel ischemic disease, advanced for age though similar to prior MRI considering differences in imaging technique. Electronically Signed   By: Elon Alas M.D.   On: 05/04/2015 03:36   I have personally reviewed and evaluated these images and lab results as part of my medical decision-making.   EKG Interpretation None      MDM   Final diagnoses:  None    Patient has had a head CT and it returned showing no acute abnormality, she does have mild chronic small vessel excuse make disease advanced for age but similar to previous MRI.  5:30 am On reevaluation the patient states her pain is gone from a 10 out of 10 down to a 4-5 out of 10. She no longer feels nauseous and is overall feeling much better. She still wants her urine checked during this visit but is unable to produce urine at this time. Will give her more fluids and another  round of pain medication.   At the end of shift the patient's urinalysis is pending. Her pain is much improved from her initial  examination. If her urinalysis is normal and the patient is continuing to feel better, she will need to be ambulated and her orthostatics rechecked before discharge. Patient sign out to [facility], PA-C   Delos Haring, Hershal Coria 05/04/15 [id number]  April Palumbo, MD 05/04/15 [id number]

## 2015-05-04 NOTE — ED Notes (Addendum)
Pt ambulatted unassisted .

## 2015-05-04 NOTE — ED Notes (Signed)
Pt states that she has had a HA x 2 weeks. Denies N//V. Hx of headaches in the past. Took tramadol w/o relief. Alert and oriented. Neuro intact. Ambulatory.

## 2015-05-04 NOTE — Discharge Instructions (Signed)
Read the information below.  You may return to the Emergency Department at any time for worsening condition or any new symptoms that concern you.  You are having a headache. No specific cause was found today for your headache. It may have been a migraine or other cause of headache. Stress, anxiety, fatigue, and depression are common triggers for headaches. Your headache today does not appear to be life-threatening or require hospitalization, but often the exact cause of headaches is not determined in the emergency department. Therefore, follow-up with your doctor is very important to find out what may have caused your headache, and whether or not you need any further diagnostic testing or treatment. Sometimes headaches can appear benign (not harmful), but then more serious symptoms can develop which should prompt an immediate re-evaluation by your doctor or the emergency department. SEEK MEDICAL ATTENTION IF: You develop possible problems with medications prescribed.  The medications don't resolve your headache, if it recurs , or if you have multiple episodes of vomiting or can't take fluids. You have a change from the usual headache. RETURN IMMEDIATELY IF you develop a sudden, severe headache or confusion, become poorly responsive or faint, develop a fever above 100.5F or problem breathing, have a change in speech, vision, swallowing, or understanding, or develop new weakness, numbness, tingling, incoordination, or have a seizure.    General Headache Without Cause A headache is pain or discomfort felt around the head or neck area. The specific cause of a headache may not be found. There are many causes and types of headaches. A few common ones are:  Tension headaches.  Migraine headaches.  Cluster headaches.  Chronic daily headaches. HOME CARE INSTRUCTIONS  Watch your condition for any changes. Take these steps to help with your condition: Managing Pain  Take over-the-counter and  prescription medicines only as told by your health care provider.  Lie down in a dark, quiet room when you have a headache.  If directed, apply ice to the head and neck area:  Put ice in a plastic bag.  Place a towel between your skin and the bag.  Leave the ice on for 20 minutes, 2-3 times per day.  Use a heating pad or hot shower to apply heat to the head and neck area as told by your health care provider.  Keep lights dim if bright lights bother you or make your headaches worse. Eating and Drinking  Eat meals on a regular schedule.  Limit alcohol use.  Decrease the amount of caffeine you drink, or stop drinking caffeine. General Instructions  Keep all follow-up visits as told by your health care provider. This is important.  Keep a headache journal to help find out what may trigger your headaches. For example, write down:  What you eat and drink.  How much sleep you get.  Any change to your diet or medicines.  Try massage or other relaxation techniques.  Limit stress.  Sit up straight, and do not tense your muscles.  Do not use tobacco products, including cigarettes, chewing tobacco, or e-cigarettes. If you need help quitting, ask your health care provider.  Exercise regularly as told by your health care provider.  Sleep on a regular schedule. Get 7-9 hours of sleep, or the amount recommended by your health care provider. SEEK MEDICAL CARE IF:   Your symptoms are not helped by medicine.  You have a headache that is different from the usual headache.  You have nausea or you vomit.  You have a  fever. SEEK IMMEDIATE MEDICAL CARE IF:   Your headache becomes severe.  You have repeated vomiting.  You have a stiff neck.  You have a loss of vision.  You have problems with speech.  You have pain in the eye or ear.  You have muscular weakness or loss of muscle control.  You lose your balance or have trouble walking.  You feel faint or pass out.  You  have confusion.   This information is not intended to replace advice given to you by your health care provider. Make sure you discuss any questions you have with your health care provider.   Document Released: 03/06/2005 Document Revised: 11/25/2014 Document Reviewed: 06/29/2014 Elsevier Interactive Patient Education 2016 Elsevier Inc.  Dysuria Dysuria is pain or discomfort while urinating. The pain or discomfort may be felt in the tube that carries urine out of the bladder (urethra) or in the surrounding tissue of the genitals. The pain may also be felt in the groin area, lower abdomen, and lower back. You may have to urinate frequently or have the sudden feeling that you have to urinate (urgency). Dysuria can affect both men and women, but is more common in women. Dysuria can be caused by many different things, including:  Urinary tract infection in women.  Infection of the kidney or bladder.  Kidney stones or bladder stones.  Certain sexually transmitted infections (STIs), such as chlamydia.  Dehydration.  Inflammation of the vagina.  Use of certain medicines.  Use of certain soaps or scented products that cause irritation. HOME CARE INSTRUCTIONS Watch your dysuria for any changes. The following actions may help to reduce any discomfort you are feeling:  Drink enough fluid to keep your urine clear or pale yellow.  Empty your bladder often. Avoid holding urine for long periods of time.  After a bowel movement or urination, women should cleanse from front to back, using each tissue only once.  Empty your bladder after sexual intercourse.  Take medicines only as directed by your health care provider.  If you were prescribed an antibiotic medicine, finish it all even if you start to feel better.  Avoid caffeine, tea, and alcohol. They can irritate the bladder and make dysuria worse. In men, alcohol may irritate the prostate.  Keep all follow-up visits as directed by your  health care provider. This is important.  If you had any tests done to find the cause of dysuria, it is your responsibility to obtain your test results. Ask the lab or department performing the test when and how you will get your results. Talk with your health care provider if you have any questions about your results. SEEK MEDICAL CARE IF:  You develop pain in your back or sides.  You have a fever.  You have nausea or vomiting.  You have blood in your urine.  You are not urinating as often as you usually do. SEEK IMMEDIATE MEDICAL CARE IF:  You pain is severe and not relieved with medicines.  You are unable to hold down any fluids.  You or someone else notices a change in your mental function.  You have a rapid heartbeat at rest.  You have shaking or chills.  You feel extremely weak.   This information is not intended to replace advice given to you by your health care provider. Make sure you discuss any questions you have with your health care provider.   Document Released: 12/03/2003 Document Revised: 03/27/2014 Document Reviewed: 10/30/2013 Elsevier Interactive Patient Education 2016  Elsevier Inc.  Orthostatic Hypotension Orthostatic hypotension is a sudden drop in blood pressure. It happens when you quickly stand up from a seated or lying position. You may feel dizzy or light-headed. This can last for just a few seconds or for up to a few minutes. It is usually not a serious problem. However, if this happens frequently or gets worse, it can be a sign of something more serious. CAUSES  Different things can cause orthostatic hypotension, including:   Loss of body fluids (dehydration).  Medicines that lower blood pressure.  Sudden changes in posture, such as standing up quickly after you have been sitting or lying down.  Taking too much of your medicine. SIGNS AND SYMPTOMS   Light-headedness or dizziness.   Fainting or near-fainting.   A fast heart rate.    Weakness.   Feeling tired (fatigue).  DIAGNOSIS  Your health care provider may do several things to help diagnose your condition and identify the cause. These may include:   Taking a medical history and doing a physical exam.  Checking your blood pressure. Your health care provider will check your blood pressure when you are:  Lying down.  Sitting.  Standing.  Using tilt table testing. In this test, you lie down on a table that moves from a lying position to a standing position. You will be strapped onto the table. This test monitors your blood pressure and heart rate when you are in different positions. TREATMENT  Treatment will vary depending on the cause. Possible treatments include:   Changing the dosage of your medicines.  Wearing compression stockings on your lower legs.  Standing up slowly after sitting or lying down.  Eating more salt.  Eating frequent, small meals.  In some cases, getting IV fluids.  Taking medicine to enhance fluid retention. HOME CARE INSTRUCTIONS  Only take over-the-counter or prescription medicines as directed by your health care provider.  Follow your health care provider's instructions for changing the dosage of your current medicines.  Do not stop or adjust your medicine on your own.  Stand up slowly after sitting or lying down. This allows your body to adjust to the different position.  Wear compression stockings as directed.  Eat extra salt as directed.  Do not add extra salt to your diet unless directed to by your health care provider.  Eat frequent, small meals.  Avoid standing suddenly after eating.  Avoid hot showers or excessive heat as directed by your health care provider.  Keep all follow-up appointments. SEEK MEDICAL CARE IF:  You continue to feel dizzy or light-headed after standing.  You feel groggy or confused.  You feel cold, clammy, or sick to your stomach (nauseous).  You have blurred  vision.  You feel short of breath. SEEK IMMEDIATE MEDICAL CARE IF:   You faint after standing.  You have chest pain.  You have difficulty breathing.   You lose feeling or movement in your arms or legs.   You have slurred speech or difficulty talking, or you are unable to talk.  MAKE SURE YOU:   Understand these instructions.  Will watch your condition.  Will get help right away if you are not doing well or get worse.   This information is not intended to replace advice given to you by your health care provider. Make sure you discuss any questions you have with your health care provider.   Document Released: 02/24/2002 Document Revised: 03/11/2013 Document Reviewed: 12/27/2012 Elsevier Interactive Patient Education 2016 Elsevier  Inc. ° °

## 2015-05-04 NOTE — ED Provider Notes (Signed)
6:46 AM Patient signed out to me at change of shift by Delos Haring, PA-C.  Patient with headache that is much improved, also c/o dysuria.  UA pending at change of shift.  Pt did have one reading of hypotension and was found to be orthostatic, 2L IVF given.  Will ambulate, recheck orthostatics prior to disposition.    8:41 AM Patient reports she feels much better.  Does not feel lightheaded or dizzy with standing and walks around comfortably.  States her headache is resolved.  She has had fatigue for the past month but today is not worse or different than normal.  Plans to call Dr Jeanie Cooks (PCP) this morning for a follow up appointment.  Denies any CP, SOB. Discussed pt with Dr Kathrynn Humble. Pt does have urinary symptoms but UA does not appear infected, will send for culture.  She has had no fever and is not toxic appearing.  She reports feeling much better.  Doubt sepsis.  I suspect the orthostatsis may be a more chronic problem, pt never becomes hypotensive with standing and feels well while doing it.    Clayton Bibles, PA-C 05/04/15 1017  April Palumbo, MD 05/07/15 [phone]

## 2015-05-05 ENCOUNTER — Ambulatory Visit: Payer: Medicare Other | Admitting: Podiatry

## 2015-05-05 LAB — URINE CULTURE: Culture: NO GROWTH

## 2015-05-23 ENCOUNTER — Other Ambulatory Visit: Payer: Self-pay | Admitting: Oncology

## 2015-05-26 ENCOUNTER — Other Ambulatory Visit (HOSPITAL_BASED_OUTPATIENT_CLINIC_OR_DEPARTMENT_OTHER): Payer: Medicare Other

## 2015-05-26 DIAGNOSIS — Z7901 Long term (current) use of anticoagulants: Secondary | ICD-10-CM

## 2015-05-26 LAB — PROTIME-INR
INR: 2.5 (ref 2.00–3.50)
Protime: 30 Seconds — ABNORMAL HIGH (ref 10.6–13.4)

## 2015-05-26 NOTE — Telephone Encounter (Signed)
Left message on voicemail for pt to continue same dos of Coumadin. Will recheck lab in one month.

## 2015-06-07 ENCOUNTER — Emergency Department (HOSPITAL_COMMUNITY)
Admission: EM | Admit: 2015-06-07 | Discharge: 2015-06-07 | Disposition: A | Discharge status: Home / Self Care | Payer: Medicare Other | Attending: Emergency Medicine | Admitting: Emergency Medicine

## 2015-06-07 ENCOUNTER — Encounter (HOSPITAL_COMMUNITY): Payer: Self-pay | Admitting: Emergency Medicine

## 2015-06-07 ENCOUNTER — Emergency Department (HOSPITAL_COMMUNITY): Payer: Medicare Other

## 2015-06-07 DIAGNOSIS — J449 Chronic obstructive pulmonary disease, unspecified: Secondary | ICD-10-CM | POA: Diagnosis not present

## 2015-06-07 DIAGNOSIS — Z9981 Dependence on supplemental oxygen: Secondary | ICD-10-CM | POA: Insufficient documentation

## 2015-06-07 DIAGNOSIS — G4730 Sleep apnea, unspecified: Secondary | ICD-10-CM | POA: Insufficient documentation

## 2015-06-07 DIAGNOSIS — Z87891 Personal history of nicotine dependence: Secondary | ICD-10-CM | POA: Diagnosis not present

## 2015-06-07 DIAGNOSIS — Z7984 Long term (current) use of oral hypoglycemic drugs: Secondary | ICD-10-CM | POA: Insufficient documentation

## 2015-06-07 DIAGNOSIS — K219 Gastro-esophageal reflux disease without esophagitis: Secondary | ICD-10-CM | POA: Diagnosis not present

## 2015-06-07 DIAGNOSIS — Z862 Personal history of diseases of the blood and blood-forming organs and certain disorders involving the immune mechanism: Secondary | ICD-10-CM | POA: Diagnosis not present

## 2015-06-07 DIAGNOSIS — Z7901 Long term (current) use of anticoagulants: Secondary | ICD-10-CM | POA: Insufficient documentation

## 2015-06-07 DIAGNOSIS — I10 Essential (primary) hypertension: Secondary | ICD-10-CM | POA: Insufficient documentation

## 2015-06-07 DIAGNOSIS — Z86718 Personal history of other venous thrombosis and embolism: Secondary | ICD-10-CM | POA: Insufficient documentation

## 2015-06-07 DIAGNOSIS — J180 Bronchopneumonia, unspecified organism: Secondary | ICD-10-CM | POA: Insufficient documentation

## 2015-06-07 DIAGNOSIS — M797 Fibromyalgia: Secondary | ICD-10-CM | POA: Insufficient documentation

## 2015-06-07 DIAGNOSIS — Z79899 Other long term (current) drug therapy: Secondary | ICD-10-CM | POA: Diagnosis not present

## 2015-06-07 DIAGNOSIS — M1990 Unspecified osteoarthritis, unspecified site: Secondary | ICD-10-CM | POA: Diagnosis not present

## 2015-06-07 DIAGNOSIS — R011 Cardiac murmur, unspecified: Secondary | ICD-10-CM | POA: Diagnosis not present

## 2015-06-07 DIAGNOSIS — E119 Type 2 diabetes mellitus without complications: Secondary | ICD-10-CM | POA: Insufficient documentation

## 2015-06-07 DIAGNOSIS — F329 Major depressive disorder, single episode, unspecified: Secondary | ICD-10-CM | POA: Diagnosis not present

## 2015-06-07 DIAGNOSIS — R05 Cough: Secondary | ICD-10-CM | POA: Diagnosis present

## 2015-06-07 LAB — BASIC METABOLIC PANEL
Anion gap: 13 (ref 5–15)
BUN: 12 mg/dL (ref 6–20)
CO2: 29 mmol/L (ref 22–32)
Calcium: 8.8 mg/dL — ABNORMAL LOW (ref 8.9–10.3)
Chloride: 99 mmol/L — ABNORMAL LOW (ref 101–111)
Creatinine, Ser: 0.8 mg/dL (ref 0.44–1.00)
GFR calc Af Amer: >60 mL/min (ref >60)
GFR calc non Af Amer: >60 mL/min (ref >60)
Glucose, Bld: 134 mg/dL — ABNORMAL HIGH (ref 65–99)
Potassium: 3.2 mmol/L — ABNORMAL LOW (ref 3.5–5.1)
Sodium: 141 mmol/L (ref 135–145)

## 2015-06-07 LAB — CBC WITH DIFFERENTIAL/PLATELET
Basophils Absolute: 0 10*3/uL (ref 0.0–0.1)
Basophils Relative: 0 %
Eosinophils Absolute: 0.2 10*3/uL (ref 0.0–0.7)
Eosinophils Relative: 3 %
HCT: 34.6 % — ABNORMAL LOW (ref 36.0–46.0)
Hemoglobin: 10.8 g/dL — ABNORMAL LOW (ref 12.0–15.0)
Lymphocytes Relative: 28 %
Lymphs Abs: 2.2 10*3/uL (ref 0.7–4.0)
MCH: 25.8 pg — ABNORMAL LOW (ref 26.0–34.0)
MCHC: 31.2 g/dL (ref 30.0–36.0)
MCV: 82.8 fL (ref 78.0–100.0)
Monocytes Absolute: 0.5 10*3/uL (ref 0.1–1.0)
Monocytes Relative: 6 %
Neutro Abs: 4.8 10*3/uL (ref 1.7–7.7)
Neutrophils Relative %: 63 %
Platelets: 355 10*3/uL (ref 150–400)
RBC: 4.18 MIL/uL (ref 3.87–5.11)
RDW: 17.7 % — ABNORMAL HIGH (ref 11.5–15.5)
WBC: 7.7 10*3/uL (ref 4.0–10.5)

## 2015-06-07 LAB — PROTIME-INR
INR: 2.29 — ABNORMAL HIGH (ref 0.00–1.49)
Prothrombin Time: 25 seconds — ABNORMAL HIGH (ref 11.6–15.2)

## 2015-06-07 MED ORDER — DOXYCYCLINE HYCLATE 100 MG PO CAPS: 100.0000 mg | ORAL_CAPSULE | Freq: Two times a day (BID) | ORAL | Status: DC

## 2015-06-07 MED ORDER — ALBUTEROL SULFATE (2.5 MG/3ML) 0.083% IN NEBU
5.0000 mg | INHALATION_SOLUTION | Freq: Once | RESPIRATORY_TRACT | Status: AC
  Administered 2015-06-07: 5 mg via RESPIRATORY_TRACT
  Filled 2015-06-07: qty 6

## 2015-06-07 MED ORDER — SODIUM CHLORIDE 0.9 % IV BOLUS (SEPSIS)
500.0000 mL | Freq: Once | INTRAVENOUS | Status: AC
  Administered 2015-06-07: 500 mL via INTRAVENOUS

## 2015-06-07 NOTE — ED Notes (Signed)
Pt c/o cough and SOB since Tuesday. Pt A&Ox4 and ambulatory. Pt appears winded and struggles to speak in complete sentences due to SOB. Pt has hx of asthma and COPD. Denies Chest pain

## 2015-06-07 NOTE — Discharge Instructions (Signed)

## 2015-06-07 NOTE — ED Notes (Signed)
PULSE OX 95% DURING AMBULATION

## 2015-06-07 NOTE — ED Provider Notes (Signed)
CSN: [id number]     Arrival date & time 06/07/15  1007 History   First MD Initiated Contact with Patient 06/07/15 1146     Chief Complaint  Patient presents with  . Cough  . Shortness of Breath     Patient is a 60 y.o. female presenting with cough and shortness of breath. The history is provided by the patient.  Cough Associated symptoms: shortness of breath   Associated symptoms: no chest pain, no headaches and no rash   Shortness of Breath Associated symptoms: cough   Associated symptoms: no abdominal pain, no chest pain, no headaches, no rash and no vomiting   Patient presents with shortness of breath and cough. His had it for the last 5 days. No real fevers. States she has had sick contacts. With similar symptoms. Has had oxygen in the past but not currently on it. No swelling or legs. Shortness of breath is with exertion. States she's having difficulty speaking because of it.  Past Medical History  Diagnosis Date  . DVT (deep venous thrombosis) (HCC)     Recurrent  . Adrenal mass ([place]) 03/226    Benign  . Lupus Anticoagulant Positive   . Clotting disorder (HCC)     +beta-2-glycoprotein IgA antibody  . History of cocaine abuse     Remote history   . Heart murmur   . Hypertension     takes meds daily  . Depression   . Shortness of breath     exertion or lying flat  . COPD (chronic obstructive pulmonary disease) (HCC)     inhalers dependent on environment  . Sleep apnea     2l of oxygen at night  . On home oxygen therapy     at night  . Diabetes mellitus     120s usually fasting  . GERD (gastroesophageal reflux disease)   . Dizziness, nonspecific   . Arthritis     knees/multiple orthopedic conditons; lower back  . History of blood transfusion   . Fibromyalgia   . Asthma     per pt   Past Surgical History  Procedure Laterality Date  . Carpal tunnel release      Bilateral  . Abdominal hysterectomy  1989    Fibroids  . Back surgery      cervical spine---disk  disease  . Cesarean section    . Replacement total knee  11/17/2008    bilateral  . Right elbow surgery    . Tubal ligation    . Gallstones removed    . Anterior lumbar fusion  01/03/2012    Procedure: ANTERIOR LUMBAR FUSION 1 LEVEL;  Surgeon: Winfield Cunas, MD;  Location: [facility];  Service: Neurosurgery;  Laterality: N/A;  Lumbar Four-Five Anterior Lumbar Interbody Fusion with Instrumentation  . Cystoscopy/retrograde/ureteroscopy/stone extraction with basket Right 04/26/2014    Procedure: CYSTOSCOPY/ RIGHT RETROGRADE/ RIGHT URETEROSCOPY/URETERAL AND RENAL PELVIS BIOPSY;  Surgeon: Alexis Frock, MD;  Location: [facility];  Service: Urology;  Laterality: Right;   Family History  Problem Relation Age of Onset  . Hypertension Father   . Heart failure Father   . Hypertension Mother   . Diabetes Mother   . Cancer Mother     Pancreatic   Social History  Substance Use Topics  . Smoking status: Former Smoker    Start date: 03/20/2001    Quit date: 03/21/2003  . Smokeless tobacco: Never Used     Comment: smoked for 2 years  . Alcohol Use: No  Comment: beer in past    OB History    No data available     Review of Systems  Constitutional: Negative for activity change and appetite change.  Eyes: Negative for pain.  Respiratory: Positive for cough and shortness of breath. Negative for chest tightness.   Cardiovascular: Negative for chest pain and leg swelling.  Gastrointestinal: Negative for nausea, vomiting, abdominal pain and diarrhea.  Genitourinary: Negative for flank pain.  Musculoskeletal: Negative for back pain and neck stiffness.  Skin: Negative for rash.  Neurological: Negative for weakness, numbness and headaches.  Psychiatric/Behavioral: Negative for behavioral problems.      Allergies  Lisinopril and Corticosteroids  Home Medications   Prior to Admission medications   Medication Sig Start Date End Date Taking? Authorizing Provider  albuterol (PROVENTIL  HFA;VENTOLIN HFA) 108 (90 BASE) MCG/ACT inhaler Inhale 2 puffs into the lungs every 4 (four) hours as needed for shortness of breath.   Yes Historical Provider, MD  albuterol (PROVENTIL) (2.5 MG/3ML) 0.083% nebulizer solution Take 2.5 mg by nebulization every 6 (six) hours as needed for wheezing or shortness of breath.   Yes Historical Provider, MD  amLODipine (NORVASC) 5 MG tablet Take 5 mg by mouth daily.   Yes Historical Provider, MD  canagliflozin (INVOKANA) 100 MG TABS tablet Take 100 mg by mouth daily.   Yes Historical Provider, MD  CARTIA XT 240 MG 24 hr capsule Take 240 mg by mouth daily. 06/17/14  Yes Historical Provider, MD  cloNIDine (CATAPRES) 0.1 MG tablet Take 0.1 mg by mouth 2 (two) times daily.   Yes Historical Provider, MD  cyclobenzaprine (FLEXERIL) 10 MG tablet Take 10 mg by mouth 2 (two) times daily as needed for muscle spasms.  06/05/14  Yes Historical Provider, MD  diclofenac sodium (VOLTAREN) 1 % GEL Apply 2 g topically daily as needed (fibromyalgia pain).   Yes Historical Provider, MD  DULoxetine (CYMBALTA) 20 MG capsule Take 20 mg by mouth at bedtime.   Yes Historical Provider, MD  DULoxetine (CYMBALTA) 60 MG capsule Take 60 mg by mouth daily.   Yes Historical Provider, MD  gabapentin (NEURONTIN) 300 MG capsule Take 300 mg by mouth 3 (three) times daily.   Yes Historical Provider, MD  hydrochlorothiazide (HYDRODIURIL) 12.5 MG tablet Take 12.5 mg by mouth daily.  03/22/15  Yes Historical Provider, MD  loratadine (CLARITIN) 10 MG tablet Take 10 mg by mouth daily as needed for allergies.   Yes Historical Provider, MD  metFORMIN (GLUCOPHAGE) 500 MG tablet Take 500 mg by mouth at bedtime.   Yes Historical Provider, MD  omeprazole (PRILOSEC) 20 MG capsule Take 20 mg by mouth 2 (two) times daily.   Yes Historical Provider, MD  polyethylene glycol powder (GLYCOLAX/MIRALAX) powder Take by mouth daily.  06/09/14  Yes Historical Provider, MD  QUEtiapine (SEROQUEL) 50 MG tablet Take 50 mg by  mouth 2 (two) times daily.   Yes Historical Provider, MD  simvastatin (ZOCOR) 20 MG tablet Take 20 mg by mouth daily at 6 PM.  03/23/14  Yes Historical Provider, MD  traMADol (ULTRAM) 50 MG tablet Take 50 mg by mouth as needed for moderate pain.  06/05/14  Yes Historical Provider, MD  warfarin (COUMADIN) 10 MG tablet Take 10 mg by mouth as directed. Take 10 mgs on Wednesday and Fridays   Yes Historical Provider, MD  warfarin (COUMADIN) 5 MG tablet TAKE AS DIRECTED Patient taking differently: on Saturday, Sunday, Monday Tuesday, Thursday take 5mg  05/26/15  Yes Ladell Pier, MD  doxycycline (VIBRAMYCIN) 100 MG capsule Take 1 capsule (100 mg total) by mouth 2 (two) times daily. 06/07/15   Davonna Belling, MD   BP 149/93 mmHg  Pulse 84  Temp(Src) 97.8 F (36.6 C) (Oral)  Resp 21  SpO2 93% Physical Exam  Constitutional: She appears well-developed.  HENT:  Head: Atraumatic.  Neck: Neck supple.  Cardiovascular: Normal rate.   Pulmonary/Chest:  Mild tachypnea. Overall somewhat quiet breath sounds. No focal  rales or rhonchi. difficulty completing sentences.   Abdominal: Soft. There is no tenderness.  Neurological: She is alert.    ED Course  Procedures (including critical care time) Labs Review Labs Reviewed  CBC WITH DIFFERENTIAL/PLATELET - Abnormal; Notable for the following:    Hemoglobin 10.8 (*)    HCT 34.6 (*)    MCH 25.8 (*)    RDW 17.7 (*)    All other components within normal limits  BASIC METABOLIC PANEL - Abnormal; Notable for the following:    Potassium 3.2 (*)    Chloride 99 (*)    Glucose, Bld 134 (*)    Calcium 8.8 (*)    All other components within normal limits  PROTIME-INR - Abnormal; Notable for the following:    Prothrombin Time 25.0 (*)    INR 2.29 (*)    All other components within normal limits    Imaging Review Dg Chest 2 View  06/07/2015  CLINICAL DATA:  Short of breath and cough EXAM: CHEST  2 VIEW COMPARISON:  08/17/2013 FINDINGS: Prominent lung  markings bilaterally have progressed since prior baseline studies including 02/02/2012 and 01-14-2012. This may be due to superimposed atelectasis or pneumonitis. No lobar infiltrate or effusion. Negative for heart failure or mass. IMPRESSION: Prominent lung markings has developed since prior baseline studies. Possible bronchopneumonia. Electronically Signed   By: Franchot Gallo M.D.   On: 06/07/2015 10:48   I have personally reviewed and evaluated these images and lab results as part of my medical decision-making.   EKG Interpretation   Date/Time:  Monday June 07 2015 10:27:27 EDT Ventricular Rate:  88 PR Interval:  155 QRS Duration: 92 QT Interval:  407 QTC Calculation: 492 R Axis:   20 Text Interpretation:  Sinus rhythm Left ventricular hypertrophy Borderline  prolonged QT interval Confirmed by Alvino Chapel  MD, Ovid Curd [phone]) on  06/07/2015 11:55:22 AM      MDM   Final diagnoses:  Bronchopneumonia    Patient shortness of breath. Has some chronic lung issues. Feels better after treatment is able ambulate without hypoxia. Will treat for pneumonia with doxycycline. Will discharge home to follow-up with her primary care doctor.    Davonna Belling, MD 06/07/15 [phone]

## 2015-06-15 ENCOUNTER — Other Ambulatory Visit: Payer: Self-pay | Admitting: Oncology

## 2015-06-15 NOTE — Telephone Encounter (Signed)
Received refill request for Coumadin. Discussed with pt, she continues 10 mg on Wed and Fri 5 mg all other days. Pt reports she has completed Doxycycline 7 day course given in ED. INR 2.29 in ED on 3/20.  Coumadin Rx sent electronically.

## 2015-06-22 ENCOUNTER — Ambulatory Visit: Payer: Medicare Other | Admitting: Podiatry

## 2015-07-05 ENCOUNTER — Telehealth: Payer: Self-pay | Admitting: Oncology

## 2015-07-05 ENCOUNTER — Other Ambulatory Visit (HOSPITAL_BASED_OUTPATIENT_CLINIC_OR_DEPARTMENT_OTHER): Payer: Medicare Other

## 2015-07-05 ENCOUNTER — Ambulatory Visit (HOSPITAL_BASED_OUTPATIENT_CLINIC_OR_DEPARTMENT_OTHER): Payer: Medicare Other | Admitting: Oncology

## 2015-07-05 VITALS — BP 141/79 | HR 84 | Temp 98.1°F | Resp 18 | Ht 66.0 in | Wt 283.5 lb

## 2015-07-05 DIAGNOSIS — Z86718 Personal history of other venous thrombosis and embolism: Secondary | ICD-10-CM

## 2015-07-05 DIAGNOSIS — D649 Anemia, unspecified: Secondary | ICD-10-CM | POA: Diagnosis not present

## 2015-07-05 DIAGNOSIS — I82403 Acute embolism and thrombosis of unspecified deep veins of lower extremity, bilateral: Secondary | ICD-10-CM

## 2015-07-05 DIAGNOSIS — Z7901 Long term (current) use of anticoagulants: Secondary | ICD-10-CM | POA: Diagnosis not present

## 2015-07-05 LAB — PROTIME-INR
INR: 1.9 — ABNORMAL LOW (ref 2.00–3.50)
Protime: 22.8 Seconds — ABNORMAL HIGH (ref 10.6–13.4)

## 2015-07-05 NOTE — Progress Notes (Signed)
  [facility] OFFICE PROGRESS NOTE   Diagnosis:  Chronic anticoagulation  INTERVAL HISTORY:    [name] returns as scheduled. She continues Coumadin. No bleeding. No symptom of thrombosis. She was treated for "pneumonia " last month.  Objective:  Vital signs in last 24 hours:  Blood pressure 141/79, pulse 84, temperature 98.1 F (36.7 C), temperature source Oral, resp. rate 18, height 5\' 6"  (1.676 m), weight 283 lb 8 oz (128.595 kg), SpO2 95 %.    Resp:  Coarse rhonchi at the right greater left posterior base, no respiratory distress Cardio:  Regular rate and rhythm GI:  No hepatosplenomegaly Vascular:  No leg edema, left lower leg is slightly larger than the right side   Lab Results:  PT/INR 1.9  Medications: I have reviewed the patient's current medications.  Assessment/Plan: 1. History of bilateral lower extremity deep vein thrombosis. She continues Coumadin and is followed in the [facility].  2. History of a positive lupus anticoagulant. 3. History of a positive beta-2-glycoprotein IgA antibody. 4. Chronic bilateral leg pain - negative bilateral Doppler January 2010. 5. History of hypertension. 6. COPD. 7. Status post hysterectomy. 8. Remote history of cocaine abuse. 9. History of an adrenal lesion on a CT scan in 2008 - status post a repeat CT on June 16, 2009, showing slow enlargement of the right adrenal nodule since 2006, most consistent with an adenoma. Stable on a CT 04/24/2014 10. History of rectal and vaginal bleeding in May 2009.  11. Status post carpal tunnel surgery. 12. 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 Right knee replacement September 2011. 13. Multiple orthopedic conditions. 14. Placement of IVC filter preoperatively before knee replacement. The IVC filter was retrieved 03/01/2011. 15. Anemia. Ferritin in normal range at 82 on 08/18/2013 16. Status post lumbar fusion surgery October 2013 17. Right  hydronephrosis-evaluated by Dr. Tresa Moore  Disposition:   [name] appears stable. She will continue Coumadin. We will adjust the Coumadin dose if the INR remains subtherapeutic  next month. She will return for an office visit in 6 months. She has a history of mild anemia. We will check a CBC when she returns next month.  Colleen Coder, MD  07/05/2015  11:10 AM

## 2015-07-05 NOTE — Telephone Encounter (Signed)
Gave and printed appt sched and avs fo rpt for OCT °

## 2015-07-07 ENCOUNTER — Encounter: Payer: Self-pay | Admitting: Podiatry

## 2015-07-07 ENCOUNTER — Ambulatory Visit (INDEPENDENT_AMBULATORY_CARE_PROVIDER_SITE_OTHER): Payer: Medicare Other | Admitting: Podiatry

## 2015-07-07 DIAGNOSIS — B351 Tinea unguium: Secondary | ICD-10-CM | POA: Diagnosis not present

## 2015-07-07 DIAGNOSIS — M79676 Pain in unspecified toe(s): Secondary | ICD-10-CM | POA: Diagnosis not present

## 2015-07-07 NOTE — Patient Instructions (Signed)
Diabetes and Foot Care Diabetes may cause you to have problems because of poor blood supply (circulation) to your feet and legs. This may cause the skin on your feet to become thinner, break easier, and heal more slowly. Your skin may become dry, and the skin may peel and crack. You may also have nerve damage in your legs and feet causing decreased feeling in them. You may not notice minor injuries to your feet that could lead to infections or more serious problems. Taking care of your feet is one of the most important things you can do for yourself.  HOME CARE INSTRUCTIONS  Wear shoes at all times, even in the house. Do not go barefoot. Bare feet are easily injured.  Check your feet daily for blisters, cuts, and redness. If you cannot see the bottom of your feet, use a mirror or ask someone for help.  Wash your feet with warm water (do not use hot water) and mild soap. Then pat your feet and the areas between your toes until they are completely dry. Do not soak your feet as this can dry your skin.  Apply a moisturizing lotion or petroleum jelly (that does not contain alcohol and is unscented) to the skin on your feet and to dry, brittle toenails. Do not apply lotion between your toes.  Trim your toenails straight across. Do not dig under them or around the cuticle. File the edges of your nails with an emery board or nail file.  Do not cut corns or calluses or try to remove them with medicine.  Wear clean socks or stockings every day. Make sure they are not too tight. Do not wear knee-high stockings since they may decrease blood flow to your legs.  Wear shoes that fit properly and have enough cushioning. To break in new shoes, wear them for just a few hours a day. This prevents you from injuring your feet. Always look in your shoes before you put them on to be sure there are no objects inside.  Do not cross your legs. This may decrease the blood flow to your feet.  If you find a minor scrape,  cut, or break in the skin on your feet, keep it and the skin around it clean and dry. These areas may be cleansed with mild soap and water. Do not cleanse the area with peroxide, alcohol, or iodine.  When you remove an adhesive bandage, be sure not to damage the skin around it.  If you have a wound, look at it several times a day to make sure it is healing.  Do not use heating pads or hot water bottles. They may burn your skin. If you have lost feeling in your feet or legs, you may not know it is happening until it is too late.  Make sure your health care provider performs a complete foot exam at least annually or more often if you have foot problems. Report any cuts, sores, or bruises to your health care provider immediately. SEEK MEDICAL CARE IF:   You have an injury that is not healing.  You have cuts or breaks in the skin.  You have an ingrown nail.  You notice redness on your legs or feet.  You feel burning or tingling in your legs or feet.  You have pain or cramps in your legs and feet.  Your legs or feet are numb.  Your feet always feel cold. SEEK IMMEDIATE MEDICAL CARE IF:   There is increasing redness,   swelling, or pain in or around a wound.  There is a red line that goes up your leg.  Pus is coming from a wound.  You develop a fever or as directed by your health care provider.  You notice a bad smell coming from an ulcer or wound.   This information is not intended to replace advice given to you by your health care provider. Make sure you discuss any questions you have with your health care provider.   Document Released: 03/03/2000 Document Revised: 11/06/2012 Document Reviewed: 08/13/2012 Elsevier Interactive Patient Education 2016 [employer]  

## 2015-07-08 NOTE — Progress Notes (Signed)
Patient ID: [name], female   DOB: 1955/12/21, 60 y.o.   MRN: [id number]  Subjective: This patient presents today complaining of thickened and elongated toenails which are uncomfortable walking wearing shoes and she requests toenail debridement. Last visit for a similar service was on 12/30/2014   Objective:  Orientated 3  Vascular: No peripheral edema noted bilaterally DP and PT pulses 2/4 bilaterally Capillary reflex immediate bilaterally  Neurological: Sensation to 10 g monofilament wire intact 5/5 bilaterally Vibratory sensation reactive bilaterally Ankle reflex equal and reactive bilaterally  Dermatological: Dry skin bilaterally No open skin lesions noted bilaterally The toenails are elongated, brittle, discolored, incurvated and tender direct palpation 6-10  Musculoskeletal: There is no restriction ankle, subtalar, midtarsal joints bilaterally No deformities noted bilaterally      Assessment & Plan:   Assessment: Diabetic without complication Symptomatic onychomycoses 6-10  Plan: Debridement toenails 10 mechanic the and electrically without any bleeding  Reappoint 4 months

## 2015-08-13 ENCOUNTER — Other Ambulatory Visit: Payer: Self-pay | Admitting: Internal Medicine

## 2015-08-13 DIAGNOSIS — Z1231 Encounter for screening mammogram for malignant neoplasm of breast: Secondary | ICD-10-CM

## 2015-08-21 ENCOUNTER — Other Ambulatory Visit: Payer: Self-pay | Admitting: Oncology

## 2015-08-23 ENCOUNTER — Other Ambulatory Visit: Payer: Self-pay | Admitting: *Deleted

## 2015-08-23 ENCOUNTER — Telehealth: Payer: Self-pay | Admitting: Oncology

## 2015-08-23 DIAGNOSIS — Z7901 Long term (current) use of anticoagulants: Secondary | ICD-10-CM

## 2015-08-23 NOTE — Telephone Encounter (Signed)
spoke w/ pt confirmed 6/6 apt °

## 2015-08-24 ENCOUNTER — Telehealth: Payer: Self-pay | Admitting: *Deleted

## 2015-08-24 ENCOUNTER — Other Ambulatory Visit (HOSPITAL_BASED_OUTPATIENT_CLINIC_OR_DEPARTMENT_OTHER): Payer: Medicare Other

## 2015-08-24 DIAGNOSIS — Z7901 Long term (current) use of anticoagulants: Secondary | ICD-10-CM

## 2015-08-24 DIAGNOSIS — Z86718 Personal history of other venous thrombosis and embolism: Secondary | ICD-10-CM

## 2015-08-24 LAB — PROTIME-INR
INR: 1.1 — ABNORMAL LOW (ref 2.00–3.50)
Protime: 13.2 Seconds (ref 10.6–13.4)

## 2015-08-24 NOTE — Addendum Note (Signed)
Addended by: San Morelle on: 08/24/2015 05:27 PM   Modules accepted: Orders

## 2015-08-24 NOTE — Telephone Encounter (Signed)
Message left for pt to return call to [facility] to confirm her Coumadin dose.

## 2015-08-24 NOTE — Telephone Encounter (Signed)
5:00PM-Second call placed to patient to confirm her Coumadin dose.  Pt states that she is taking 10 mg on Wednesday and Friday and 5 mg on Monday, Tuesday, Thursday, Saturday and Sunday.  Patient states that she has missed one dose this week.  Dr. Benay Spice notified and order received for patient to continue same dose as above and to repeat PT/INR in one week.  Return call placed to patient and message left with MD instructions as above for patient.

## 2015-08-25 ENCOUNTER — Telehealth: Payer: Self-pay | Admitting: Oncology

## 2015-08-25 NOTE — Telephone Encounter (Signed)
spoke w/ pt confirmed 6/13 lab

## 2015-08-30 ENCOUNTER — Other Ambulatory Visit: Payer: Self-pay | Admitting: Oncology

## 2015-08-31 ENCOUNTER — Other Ambulatory Visit (HOSPITAL_BASED_OUTPATIENT_CLINIC_OR_DEPARTMENT_OTHER): Payer: Medicare Other

## 2015-08-31 ENCOUNTER — Telehealth: Payer: Self-pay | Admitting: *Deleted

## 2015-08-31 DIAGNOSIS — Z86718 Personal history of other venous thrombosis and embolism: Secondary | ICD-10-CM | POA: Diagnosis present

## 2015-08-31 DIAGNOSIS — Z7901 Long term (current) use of anticoagulants: Secondary | ICD-10-CM

## 2015-08-31 LAB — PROTIME-INR
INR: 2.1 (ref 2.00–3.50)
Protime: 25.2 Seconds — ABNORMAL HIGH (ref 10.6–13.4)

## 2015-08-31 NOTE — Telephone Encounter (Signed)
Called pt with instructions to continue Coumadin 5 mg daily except 10 mg every Wed and Fri- per Dr. Benay Spice. She reports she took 20 mg x2 days "to get it back up." She resumed regular dosing on 6/12. Order sent to schedulers for appt.

## 2015-09-01 ENCOUNTER — Telehealth: Payer: Self-pay | Admitting: Oncology

## 2015-09-01 NOTE — Telephone Encounter (Signed)
S.w. Pt and advised on July appt....the patient ok and aware

## 2015-09-27 ENCOUNTER — Other Ambulatory Visit: Payer: Self-pay | Admitting: *Deleted

## 2015-09-27 ENCOUNTER — Telehealth: Payer: Self-pay | Admitting: *Deleted

## 2015-09-27 ENCOUNTER — Encounter (HOSPITAL_COMMUNITY): Payer: Self-pay | Admitting: Emergency Medicine

## 2015-09-27 ENCOUNTER — Emergency Department (HOSPITAL_COMMUNITY): Payer: Medicare Other

## 2015-09-27 ENCOUNTER — Emergency Department (HOSPITAL_COMMUNITY)
Admission: EM | Admit: 2015-09-27 | Discharge: 2015-09-27 | Disposition: A | Discharge status: Home / Self Care | Payer: Medicare Other | Attending: Emergency Medicine | Admitting: Emergency Medicine

## 2015-09-27 DIAGNOSIS — R51 Headache: Secondary | ICD-10-CM | POA: Diagnosis present

## 2015-09-27 DIAGNOSIS — R112 Nausea with vomiting, unspecified: Secondary | ICD-10-CM | POA: Insufficient documentation

## 2015-09-27 DIAGNOSIS — M1990 Unspecified osteoarthritis, unspecified site: Secondary | ICD-10-CM | POA: Diagnosis not present

## 2015-09-27 DIAGNOSIS — J45909 Unspecified asthma, uncomplicated: Secondary | ICD-10-CM | POA: Insufficient documentation

## 2015-09-27 DIAGNOSIS — F329 Major depressive disorder, single episode, unspecified: Secondary | ICD-10-CM | POA: Diagnosis not present

## 2015-09-27 DIAGNOSIS — J449 Chronic obstructive pulmonary disease, unspecified: Secondary | ICD-10-CM | POA: Diagnosis not present

## 2015-09-27 DIAGNOSIS — R519 Headache, unspecified: Secondary | ICD-10-CM

## 2015-09-27 DIAGNOSIS — E119 Type 2 diabetes mellitus without complications: Secondary | ICD-10-CM | POA: Insufficient documentation

## 2015-09-27 DIAGNOSIS — R791 Abnormal coagulation profile: Secondary | ICD-10-CM

## 2015-09-27 DIAGNOSIS — I10 Essential (primary) hypertension: Secondary | ICD-10-CM | POA: Insufficient documentation

## 2015-09-27 DIAGNOSIS — D649 Anemia, unspecified: Secondary | ICD-10-CM

## 2015-09-27 DIAGNOSIS — Z87891 Personal history of nicotine dependence: Secondary | ICD-10-CM | POA: Insufficient documentation

## 2015-09-27 LAB — BASIC METABOLIC PANEL
Anion gap: 10 (ref 5–15)
BUN: 14 mg/dL (ref 6–20)
CO2: 27 mmol/L (ref 22–32)
Calcium: 8.6 mg/dL — ABNORMAL LOW (ref 8.9–10.3)
Chloride: 98 mmol/L — ABNORMAL LOW (ref 101–111)
Creatinine, Ser: 0.69 mg/dL (ref 0.44–1.00)
GFR calc Af Amer: >60 mL/min (ref >60)
GFR calc non Af Amer: >60 mL/min (ref >60)
Glucose, Bld: 154 mg/dL — ABNORMAL HIGH (ref 65–99)
Potassium: 3.8 mmol/L (ref 3.5–5.1)
Sodium: 135 mmol/L (ref 135–145)

## 2015-09-27 LAB — CBC WITH DIFFERENTIAL/PLATELET
Basophils Absolute: 0 10*3/uL (ref 0.0–0.1)
Basophils Relative: 0 %
Eosinophils Absolute: 0.1 10*3/uL (ref 0.0–0.7)
Eosinophils Relative: 2 %
HCT: 35.4 % — ABNORMAL LOW (ref 36.0–46.0)
Hemoglobin: 11.4 g/dL — ABNORMAL LOW (ref 12.0–15.0)
Lymphocytes Relative: 36 %
Lymphs Abs: 2.4 10*3/uL (ref 0.7–4.0)
MCH: 26 pg (ref 26.0–34.0)
MCHC: 32.2 g/dL (ref 30.0–36.0)
MCV: 80.8 fL (ref 78.0–100.0)
Monocytes Absolute: 0.4 10*3/uL (ref 0.1–1.0)
Monocytes Relative: 6 %
Neutro Abs: 3.7 10*3/uL (ref 1.7–7.7)
Neutrophils Relative %: 56 %
Platelets: 348 10*3/uL (ref 150–400)
RBC: 4.38 MIL/uL (ref 3.87–5.11)
RDW: 17.9 % — ABNORMAL HIGH (ref 11.5–15.5)
WBC: 6.6 10*3/uL (ref 4.0–10.5)

## 2015-09-27 LAB — PROTIME-INR
INR: 1.39 (ref 0.00–1.49)
Prothrombin Time: 17.2 seconds — ABNORMAL HIGH (ref 11.6–15.2)

## 2015-09-27 MED ORDER — SODIUM CHLORIDE 0.9 % IV SOLN
1000.0000 mL | Freq: Once | INTRAVENOUS | Status: AC
  Administered 2015-09-27: 1000 mL via INTRAVENOUS

## 2015-09-27 MED ORDER — METOCLOPRAMIDE HCL 10 MG PO TABS: 10.0000 mg | ORAL_TABLET | Freq: Four times a day (QID) | ORAL | Status: DC | PRN

## 2015-09-27 MED ORDER — DIPHENHYDRAMINE HCL 50 MG/ML IJ SOLN
25.0000 mg | Freq: Once | INTRAMUSCULAR | Status: AC
  Administered 2015-09-27: 25 mg via INTRAVENOUS
  Filled 2015-09-27: qty 1

## 2015-09-27 MED ORDER — OXYCODONE-ACETAMINOPHEN 5-325 MG PO TABS: 1.0000 | ORAL_TABLET | ORAL | Status: DC | PRN

## 2015-09-27 MED ORDER — SODIUM CHLORIDE 0.9 % IV SOLN: 1000.0000 mL | INTRAVENOUS | Status: DC

## 2015-09-27 MED ORDER — METOCLOPRAMIDE HCL 5 MG/ML IJ SOLN
10.0000 mg | Freq: Once | INTRAMUSCULAR | Status: AC
  Administered 2015-09-27: 10 mg via INTRAVENOUS
  Filled 2015-09-27: qty 2

## 2015-09-27 NOTE — ED Notes (Signed)
Brought in by EMS from home with c/o headache.  Per EMS, pt reported that she has been having persistent left-sided headache for 5 days now.  Pt denies taking OTC for symptoms.  Pt also reports having nausea and vomiting, onset last night.  No other complaints--- denies any change in vision.

## 2015-09-27 NOTE — Discharge Instructions (Signed)
Your INR (the blood tests for your warfarin) was too low today. Please call your oncologist to get instructions on how to adjust your warfarin dose.  General Headache Without Cause A headache is pain or discomfort felt around the head or neck area. The specific cause of a headache may not be found. There are many causes and types of headaches. A few common ones are:  Tension headaches.  Migraine headaches.  Cluster headaches.  Chronic daily headaches. HOME CARE INSTRUCTIONS  Watch your condition for any changes. Take these steps to help with your condition: Managing Pain  Take over-the-counter and prescription medicines only as told by your health care provider.  Lie down in a dark, quiet room when you have a headache.  If directed, apply ice to the head and neck area:  Put ice in a plastic bag.  Place a towel between your skin and the bag.  Leave the ice on for 20 minutes, 2-3 times per day.  Use a heating pad or hot shower to apply heat to the head and neck area as told by your health care provider.  Keep lights dim if bright lights bother you or make your headaches worse. Eating and Drinking  Eat meals on a regular schedule.  Limit alcohol use.  Decrease the amount of caffeine you drink, or stop drinking caffeine. General Instructions  Keep all follow-up visits as told by your health care provider. This is important.  Keep a headache journal to help find out what may trigger your headaches. For example, write down:  What you eat and drink.  How much sleep you get.  Any change to your diet or medicines.  Try massage or other relaxation techniques.  Limit stress.  Sit up straight, and do not tense your muscles.  Do not use tobacco products, including cigarettes, chewing tobacco, or e-cigarettes. If you need help quitting, ask your health care provider.  Exercise regularly as told by your health care provider.  Sleep on a regular schedule. Get 7-9 hours of  sleep, or the amount recommended by your health care provider. SEEK MEDICAL CARE IF:   Your symptoms are not helped by medicine.  You have a headache that is different from the usual headache.  You have nausea or you vomit.  You have a fever. SEEK IMMEDIATE MEDICAL CARE IF:   Your headache becomes severe.  You have repeated vomiting.  You have a stiff neck.  You have a loss of vision.  You have problems with speech.  You have pain in the eye or ear.  You have muscular weakness or loss of muscle control.  You lose your balance or have trouble walking.  You feel faint or pass out.  You have confusion.   This information is not intended to replace advice given to you by your health care provider. Make sure you discuss any questions you have with your health care provider.   Document Released: 03/06/2005 Document Revised: 11/25/2014 Document Reviewed: 06/29/2014 Elsevier Interactive Patient Education 2016 [employer]  Metoclopramide tablets What is this medicine? METOCLOPRAMIDE (met oh kloe PRA mide) is used to treat the symptoms of gastroesophageal reflux disease (GERD) like heartburn. It is also used to treat people with slow emptying of the stomach and intestinal tract. This medicine may be used for other purposes; ask your health care provider or pharmacist if you have questions. What should I tell my health care provider before I take this medicine? They need to know if you have  any of these conditions: -breast cancer -depression -diabetes -heart failure -high blood pressure -kidney disease -liver disease -Parkinson's disease or a movement disorder -pheochromocytoma -seizures -stomach obstruction, bleeding, or perforation -an unusual or allergic reaction to metoclopramide, procainamide, sulfites, other medicines, foods, dyes, or preservatives -pregnant or trying to get pregnant -breast-feeding How should I use this medicine? Take this medicine by mouth  with a glass of water. Follow the directions on the prescription label. Take this medicine on an empty stomach, about 30 minutes before eating. Take your doses at regular intervals. Do not take your medicine more often than directed. Do not stop taking except on the advice of your doctor or health care professional. A special MedGuide will be given to you by the pharmacist with each prescription and refill. Be sure to read this information carefully each time. Talk to your pediatrician regarding the use of this medicine in children. Special care may be needed. Overdosage: If you think you have taken too much of this medicine contact a poison control center or emergency room at once. NOTE: This medicine is only for you. Do not share this medicine with others. What if I miss a dose? If you miss a dose, take it as soon as you can. If it is almost time for your next dose, take only that dose. Do not take double or extra doses. What may interact with this medicine? -acetaminophen -cyclosporine -digoxin -medicines for blood pressure -medicines for diabetes, including insulin -medicines for hay fever and other allergies -medicines for depression, especially an Monoamine Oxidase Inhibitor (MAOI) -medicines for Parkinson's disease, like levodopa -medicines for sleep or for pain -tetracycline This list may not describe all possible interactions. Give your health care provider a list of all the medicines, herbs, non-prescription drugs, or dietary supplements you use. Also tell them if you smoke, drink alcohol, or use illegal drugs. Some items may interact with your medicine. What should I watch for while using this medicine? It may take a few weeks for your stomach condition to start to get better. However, do not take this medicine for longer than 12 weeks. The longer you take this medicine, and the more you take it, the greater your chances are of developing serious side effects. If you are an elderly  patient, a female patient, or you have diabetes, you may be at an increased risk for side effects from this medicine. Contact your doctor immediately if you start having movements you cannot control such as lip smacking, rapid movements of the tongue, involuntary or uncontrollable movements of the eyes, head, arms and legs, or muscle twitches and spasms. Patients and their families should watch out for worsening depression or thoughts of suicide. Also watch out for any sudden or severe changes in feelings such as feeling anxious, agitated, panicky, irritable, hostile, aggressive, impulsive, severely restless, overly excited and hyperactive, or not being able to sleep. If this happens, especially at the beginning of treatment or after a change in dose, call your doctor. Do not treat yourself for high fever. Ask your doctor or health care professional for advice. You may get drowsy or dizzy. Do not drive, use machinery, or do anything that needs mental alertness until you know how this drug affects you. Do not stand or sit up quickly, especially if you are an older patient. This reduces the risk of dizzy or fainting spells. Alcohol can make you more drowsy and dizzy. Avoid alcoholic drinks. What side effects may I notice from receiving this medicine? Side  effects that you should report to your doctor or health care professional as soon as possible: -allergic reactions like skin rash, itching or hives, swelling of the face, lips, or tongue -abnormal production of milk in females -breast enlargement in both males and females -change in the way you walk -difficulty moving, speaking or swallowing -drooling, lip smacking, or rapid movements of the tongue -excessive sweating -fever -involuntary or uncontrollable movements of the eyes, head, arms and legs -irregular heartbeat or palpitations -muscle twitches and spasms -unusually weak or tired Side effects that usually do not require medical attention  (report to your doctor or health care professional if they continue or are bothersome): -change in sex drive or performance -depressed mood -diarrhea -difficulty sleeping -headache -menstrual changes -restless or nervous This list may not describe all possible side effects. Call your doctor for medical advice about side effects. You may report side effects to FDA at 1-800-FDA-1088. Where should I keep my medicine? Keep out of the reach of children. Store at room temperature between 20 and 25 degrees C (68 and 77 degrees F). Protect from light. Keep container tightly closed. Throw away any unused medicine after the expiration date. NOTE: This sheet is a summary. It may not cover all possible information. If you have questions about this medicine, talk to your doctor, pharmacist, or health care provider.    2016, Elsevier/Gold Standard. (2011-07-04 13:04:38)  Acetaminophen; Oxycodone tablets What is this medicine? ACETAMINOPHEN; OXYCODONE (a set a MEE noe fen; ox i KOE done) is a pain reliever. It is used to treat moderate to severe pain. This medicine may be used for other purposes; ask your health care provider or pharmacist if you have questions. What should I tell my health care provider before I take this medicine? They need to know if you have any of these conditions: -brain tumor -Crohn's disease, inflammatory bowel disease, or ulcerative colitis -drug abuse or addiction -head injury -heart or circulation problems -if you often drink alcohol -kidney disease or problems going to the bathroom -liver disease -lung disease, asthma, or breathing problems -an unusual or allergic reaction to acetaminophen, oxycodone, other opioid analgesics, other medicines, foods, dyes, or preservatives -pregnant or trying to get pregnant -breast-feeding How should I use this medicine? Take this medicine by mouth with a full glass of water. Follow the directions on the prescription label. You can  take it with or without food. If it upsets your stomach, take it with food. Take your medicine at regular intervals. Do not take it more often than directed. Talk to your pediatrician regarding the use of this medicine in children. Special care may be needed. Patients over 43 years old may have a stronger reaction and need a smaller dose. Overdosage: If you think you have taken too much of this medicine contact a poison control center or emergency room at once. NOTE: This medicine is only for you. Do not share this medicine with others. What if I miss a dose? If you miss a dose, take it as soon as you can. If it is almost time for your next dose, take only that dose. Do not take double or extra doses. What may interact with this medicine? -alcohol -antihistamines -barbiturates like amobarbital, butalbital, butabarbital, methohexital, pentobarbital, phenobarbital, thiopental, and secobarbital -benztropine -drugs for bladder problems like solifenacin, trospium, oxybutynin, tolterodine, hyoscyamine, and methscopolamine -drugs for breathing problems like ipratropium and tiotropium -drugs for certain stomach or intestine problems like propantheline, homatropine methylbromide, glycopyrrolate, atropine, belladonna, and dicyclomine -general anesthetics  like etomidate, ketamine, nitrous oxide, propofol, desflurane, enflurane, halothane, isoflurane, and sevoflurane -medicines for depression, anxiety, or psychotic disturbances -medicines for sleep -muscle relaxants -naltrexone -narcotic medicines (opiates) for pain -phenothiazines like perphenazine, thioridazine, chlorpromazine, mesoridazine, fluphenazine, prochlorperazine, promazine, and trifluoperazine -scopolamine -tramadol -trihexyphenidyl This list may not describe all possible interactions. Give your health care provider a list of all the medicines, herbs, non-prescription drugs, or dietary supplements you use. Also tell them if you smoke, drink  alcohol, or use illegal drugs. Some items may interact with your medicine. What should I watch for while using this medicine? Tell your doctor or health care professional if your pain does not go away, if it gets worse, or if you have new or a different type of pain. You may develop tolerance to the medicine. Tolerance means that you will need a higher dose of the medication for pain relief. Tolerance is normal and is expected if you take this medicine for a long time. Do not suddenly stop taking your medicine because you may develop a severe reaction. Your body becomes used to the medicine. This does NOT mean you are addicted. Addiction is a behavior related to getting and using a drug for a non-medical reason. If you have pain, you have a medical reason to take pain medicine. Your doctor will tell you how much medicine to take. If your doctor wants you to stop the medicine, the dose will be slowly lowered over time to avoid any side effects. You may get drowsy or dizzy. Do not drive, use machinery, or do anything that needs mental alertness until you know how this medicine affects you. Do not stand or sit up quickly, especially if you are an older patient. This reduces the risk of dizzy or fainting spells. Alcohol may interfere with the effect of this medicine. Avoid alcoholic drinks. There are different types of narcotic medicines (opiates) for pain. If you take more than one type at the same time, you may have more side effects. Give your health care provider a list of all medicines you use. Your doctor will tell you how much medicine to take. Do not take more medicine than directed. Call emergency for help if you have problems breathing. The medicine will cause constipation. Try to have a bowel movement at least every 2 to 3 days. If you do not have a bowel movement for 3 days, call your doctor or health care professional. Do not take Tylenol (acetaminophen) or medicines that have acetaminophen with this  medicine. Too much acetaminophen can be very dangerous. Many nonprescription medicines contain acetaminophen. Always read the labels carefully to avoid taking more acetaminophen. What side effects may I notice from receiving this medicine? Side effects that you should report to your doctor or health care professional as soon as possible: -allergic reactions like skin rash, itching or hives, swelling of the face, lips, or tongue -breathing difficulties, wheezing -confusion -light headedness or fainting spells -severe stomach pain -unusually weak or tired -yellowing of the skin or the whites of the eyes Side effects that usually do not require medical attention (report to your doctor or health care professional if they continue or are bothersome): -dizziness -drowsiness -nausea -vomiting This list may not describe all possible side effects. Call your doctor for medical advice about side effects. You may report side effects to FDA at 1-800-FDA-1088. Where should I keep my medicine? Keep out of the reach of children. This medicine can be abused. Keep your medicine in a safe place to  protect it from theft. Do not share this medicine with anyone. Selling or giving away this medicine is dangerous and against the law. This medicine may cause accidental overdose and death if it taken by other adults, children, or pets. Mix any unused medicine with a substance like cat litter or coffee grounds. Then throw the medicine away in a sealed container like a sealed bag or a coffee can with a lid. Do not use the medicine after the expiration date. Store at room temperature between 20 and 25 degrees C (68 and 77 degrees F). NOTE: This sheet is a summary. It may not cover all possible information. If you have questions about this medicine, talk to your doctor, pharmacist, or health care provider.    2016, Elsevier/Gold Standard. (2014-02-04 15:18:46)

## 2015-09-27 NOTE — Telephone Encounter (Signed)
Received call from patient concerned that her INR was low this morning in the ER.  Dr. Benay Spice notified of patients concerns and order received from Dr. Benay Spice to increase Coumadin to 10 mg on Monday, Wednesday and Friday and 5 mg every other day.  Patient notified of MD instructions and verbalizes an understanding of MD instructions.  Pt aware of lab draw on 09/29/15 at 0915.

## 2015-09-27 NOTE — ED Provider Notes (Signed)
CSN: [id number]     Arrival date & time 09/27/15  [id number] History  By signing my name below, I, [facility], attest that this documentation has been prepared under the direction and in the presence of Delora Fuel, MD. Electronically Signed: Virgel Bouquet, ED Scribe. 09/27/2015. 3:53 AM.   Chief Complaint  Patient presents with  . Headache    The history is provided by the patient. No language interpreter was used.   HPI Comments: [name] is a 60 y.o. female with a hx of HTN, DM, COPD, heart murmur, clotting disorder, and DVT who presents to the Emergency Department complaining of constant, moderate, gradually worsening HA to the left posterior head onset 5 days ago. She reports associated nausea, vomiting, and a that her "head feels heavy". Pain is worse with movement of the left shoulder toward the head. She has taken Tramadol without relief. She takes warfarin regularly.  Denies similar symptoms in the past. Denies blurred or double vision, weakness, numbness, tingling, or any other symptoms currently.  Past Medical History  Diagnosis Date  . DVT (deep venous thrombosis) (HCC)     Recurrent  . Adrenal mass ([place]) 03/226    Benign  . Lupus Anticoagulant Positive   . Clotting disorder (HCC)     +beta-2-glycoprotein IgA antibody  . History of cocaine abuse     Remote history   . Heart murmur   . Hypertension     takes meds daily  . Depression   . Shortness of breath     exertion or lying flat  . COPD (chronic obstructive pulmonary disease) (HCC)     inhalers dependent on environment  . Sleep apnea     2l of oxygen at night  . On home oxygen therapy     at night  . Diabetes mellitus     120s usually fasting  . GERD (gastroesophageal reflux disease)   . Dizziness, nonspecific   . Arthritis     knees/multiple orthopedic conditons; lower back  . History of blood transfusion   . Fibromyalgia   . Asthma     per pt   Past Surgical History  Procedure Laterality  Date  . Carpal tunnel release      Bilateral  . Abdominal hysterectomy  1989    Fibroids  . Back surgery      cervical spine---disk disease  . Cesarean section    . Replacement total knee  11/17/2008    bilateral  . Right elbow surgery    . Tubal ligation    . Gallstones removed    . Anterior lumbar fusion  01/03/2012    Procedure: ANTERIOR LUMBAR FUSION 1 LEVEL;  Surgeon: Winfield Cunas, MD;  Location: [place] [facility];  Service: Neurosurgery;  Laterality: N/A;  Lumbar Four-Five Anterior Lumbar Interbody Fusion with Instrumentation  . Cystoscopy/retrograde/ureteroscopy/stone extraction with basket Right 04/26/2014    Procedure: CYSTOSCOPY/ RIGHT RETROGRADE/ RIGHT URETEROSCOPY/URETERAL AND RENAL PELVIS BIOPSY;  Surgeon: Alexis Frock, MD;  Location: [facility];  Service: Urology;  Laterality: Right;   Family History  Problem Relation Age of Onset  . Hypertension Father   . Heart failure Father   . Hypertension Mother   . Diabetes Mother   . Cancer Mother     Pancreatic   Social History  Substance Use Topics  . Smoking status: Former Smoker    Start date: 03/20/2001    Quit date: 03/21/2003  . Smokeless tobacco: Never Used     Comment: smoked  for 2 years  . Alcohol Use: No     Comment: beer in past    OB History    No data available     Review of Systems  Eyes: Negative for visual disturbance.  Gastrointestinal: Positive for nausea and vomiting.  Neurological: Positive for headaches. Negative for weakness and numbness.  All other systems reviewed and are negative.     Allergies  Lisinopril and Corticosteroids  Home Medications   Prior to Admission medications   Medication Sig Start Date End Date Taking? Authorizing Provider  albuterol (PROVENTIL HFA;VENTOLIN HFA) 108 (90 BASE) MCG/ACT inhaler Inhale 2 puffs into the lungs every 4 (four) hours as needed for shortness of breath.    Historical Provider, MD  albuterol (PROVENTIL) (2.5 MG/3ML) 0.083% nebulizer solution Take  2.5 mg by nebulization every 6 (six) hours as needed for wheezing or shortness of breath.    Historical Provider, MD  amLODipine (NORVASC) 5 MG tablet Take 5 mg by mouth daily.    Historical Provider, MD  benzonatate (TESSALON) 100 MG capsule Take 1 capsule by mouth at bedtime as needed. 06/18/15   Historical Provider, MD  canagliflozin (INVOKANA) 100 MG TABS tablet Take 100 mg by mouth daily.    Historical Provider, MD  CARTIA XT 240 MG 24 hr capsule Take 240 mg by mouth daily. 06/17/14   Historical Provider, MD  cloNIDine (CATAPRES) 0.1 MG tablet Take 0.1 mg by mouth 2 (two) times daily.    Historical Provider, MD  cyclobenzaprine (FLEXERIL) 10 MG tablet Take 10 mg by mouth 2 (two) times daily as needed for muscle spasms.  06/05/14   Historical Provider, MD  diclofenac sodium (VOLTAREN) 1 % GEL Apply 2 g topically daily as needed (fibromyalgia pain).    Historical Provider, MD  DULoxetine (CYMBALTA) 20 MG capsule Take 20 mg by mouth at bedtime.    Historical Provider, MD  DULoxetine (CYMBALTA) 60 MG capsule Take 60 mg by mouth daily.    Historical Provider, MD  gabapentin (NEURONTIN) 300 MG capsule Take 300 mg by mouth 3 (three) times daily.    Historical Provider, MD  hydrochlorothiazide (HYDRODIURIL) 12.5 MG tablet Take 12.5 mg by mouth daily.  03/22/15   Historical Provider, MD  loratadine (CLARITIN) 10 MG tablet Take 10 mg by mouth daily as needed for allergies.    Historical Provider, MD  metFORMIN (GLUCOPHAGE) 500 MG tablet Take 500 mg by mouth at bedtime.    Historical Provider, MD  omeprazole (PRILOSEC) 20 MG capsule Take 20 mg by mouth 2 (two) times daily.    Historical Provider, MD  polyethylene glycol powder (GLYCOLAX/MIRALAX) powder Take by mouth daily.  06/09/14   Historical Provider, MD  pravastatin (PRAVACHOL) 40 MG tablet Take 40 mg by mouth daily. 06/15/15   Historical Provider, MD  QUEtiapine (SEROQUEL) 50 MG tablet Take 50 mg by mouth 2 (two) times daily.    Historical Provider, MD   traMADol (ULTRAM) 50 MG tablet Take 50 mg by mouth as needed for moderate pain.  06/05/14   Historical Provider, MD  warfarin (COUMADIN) 10 MG tablet TAKE ON [facility] AND [facility] OR AS DIRECTED 06/15/15   Ladell Pier, MD  warfarin (COUMADIN) 10 MG tablet TAKE ON [facility] AND [facility] OR AS DIRECTED 08/30/15   Ladell Pier, MD  warfarin (COUMADIN) 5 MG tablet TAKE AS DIRECTED 08/23/15   Ladell Pier, MD   BP 143/92 mmHg  Pulse 91  Temp(Src) 98.1 F (36.7 C) (Oral)  Resp  18  Ht 5\' 6"  (1.676 m)  Wt 252 lb (114.306 kg)  BMI 40.69 kg/m2  SpO2 95% Physical Exam  Constitutional: She is oriented to person, place, and time. She appears well-developed and well-nourished. No distress.  HENT:  Head: Normocephalic and atraumatic.  Markedly tender at the insertion of the left paracervical muscles.  Eyes: Conjunctivae and EOM are normal. Pupils are equal, round, and reactive to light.  Fundi are normal.  Neck: Normal range of motion. No JVD present.  Cardiovascular: Normal rate, regular rhythm and normal heart sounds.   No murmur heard. Pulmonary/Chest: Effort normal and breath sounds normal. She has no wheezes. She has no rales. She exhibits no tenderness.  Abdominal: Soft. Bowel sounds are normal. She exhibits no distension and no mass. There is no tenderness.  Musculoskeletal: Normal range of motion. She exhibits no edema.  Lymphadenopathy:    She has no cervical adenopathy.  Neurological: She is alert and oriented to person, place, and time. No cranial nerve deficit. She exhibits normal muscle tone. Coordination normal.  Skin: Skin is warm and dry. No rash noted.  Psychiatric: She has a normal mood and affect. Her behavior is normal. Judgment and thought content normal.  Nursing note and vitals reviewed.   ED Course  Procedures   DIAGNOSTIC STUDIES: Oxygen Saturation is 95% on RA, adequate by my interpretation.    COORDINATION OF CARE: 3:44 AM Will order head CT scan, labs,  Reglan, Benadryl, and IV fluids. Discussed treatment plan with pt at bedside and pt agreed to plan.   Labs Review Results for orders placed or performed during the hospital encounter of [id number]  Basic metabolic panel  Result Value Ref Range   Sodium 135 135 - 145 mmol/L   Potassium 3.8 3.5 - 5.1 mmol/L   Chloride 98 (L) 101 - 111 mmol/L   CO2 27 22 - 32 mmol/L   Glucose, Bld 154 (H) 65 - 99 mg/dL   BUN 14 6 - 20 mg/dL   Creatinine, Ser 0.69 0.44 - 1.00 mg/dL   Calcium 8.6 (L) 8.9 - 10.3 mg/dL   GFR calc non Af Amer >60 >60 mL/min   GFR calc Af Amer >60 >60 mL/min   Anion gap 10 5 - 15  CBC with Differential  Result Value Ref Range   WBC 6.6 4.0 - 10.5 K/uL   RBC 4.38 3.87 - 5.11 MIL/uL   Hemoglobin 11.4 (L) 12.0 - 15.0 g/dL   HCT 35.4 (L) 36.0 - 46.0 %   MCV 80.8 78.0 - 100.0 fL   MCH 26.0 26.0 - 34.0 pg   MCHC 32.2 30.0 - 36.0 g/dL   RDW 17.9 (H) 11.5 - 15.5 %   Platelets 348 150 - 400 K/uL   Neutrophils Relative % 56 %   Neutro Abs 3.7 1.7 - 7.7 K/uL   Lymphocytes Relative 36 %   Lymphs Abs 2.4 0.7 - 4.0 K/uL   Monocytes Relative 6 %   Monocytes Absolute 0.4 0.1 - 1.0 K/uL   Eosinophils Relative 2 %   Eosinophils Absolute 0.1 0.0 - 0.7 K/uL   Basophils Relative 0 %   Basophils Absolute 0.0 0.0 - 0.1 K/uL  Protime-INR  Result Value Ref Range   Prothrombin Time 17.2 (H) 11.6 - 15.2 seconds   INR 1.39 0.00 - 1.49    Imaging Review Ct Head Wo Contrast  09/27/2015  CLINICAL DATA:  Posterior LEFT headache for 5 days. Nausea and vomiting. History of hypertension, diabetes, lupus.  EXAM: CT HEAD WITHOUT CONTRAST TECHNIQUE: Contiguous axial images were obtained from the base of the skull through the vertex without intravenous contrast. COMPARISON:  CT HEAD May 04, 2015 FINDINGS: INTRACRANIAL CONTENTS: The ventricles and sulci are normal. Minimal similar white matter hypodensities. No intraparenchymal hemorrhage, mass effect nor midline shift. No acute large vascular  territory infarcts. No abnormal extra-axial fluid collections. Basal cisterns are patent. ORBITS: The included ocular globes and orbital contents are normal. SINUSES: The mastoid aircells and included paranasal sinuses are well-aerated. SKULL/SOFT TISSUES: No skull fracture. No significant soft tissue swelling. IMPRESSION: No acute intracranial process. Minimal white matter changes compatible chronic small vessel ischemic disease. Electronically Signed   By: Elon Alas M.D.   On: 09/27/2015 04:33   I have personally reviewed and evaluated these images and lab results as part of my medical decision-making.    MDM   Final diagnoses:  Headache, unspecified headache type  Subtherapeutic international normalized ratio (INR)  Normochromic normocytic anemia    Headache of uncertain cause. Tenderness on palpation is suggestive of muscle contraction headache. No symptoms that are suggestive of migraines. Old records are reviewed and she is anticoagulated on warfarin because of history of DVT and presence of lupus anticoagulant. Last INR was obtained on June 13 and was therapeutic at 2.10. Because of anticoagulation, she is certainly at risk for intracerebral bleeding. She will need to be sent for CT of head and INR will be checked. In the meantime, she is given a headache cocktail of normal saline, metoclopramide, diphenhydramine.  She had good relief of headache with above noted treatment. CT shows no evidence of bleeding. INR has come back significantly subtherapeutic. Patient is advised of these results. She is discharged with prescription for metoclopramide and also a small number of oxycodone-acetaminophen tablets. She is to call her oncologist today for instructions on how to adjust her warfarin dose.  I personally performed the services described in this documentation, which was scribed in my presence. The recorded information has been reviewed and is accurate.      Delora Fuel,  MD [phone]

## 2015-09-29 ENCOUNTER — Telehealth: Payer: Self-pay | Admitting: Medical Oncology

## 2015-09-29 ENCOUNTER — Other Ambulatory Visit (HOSPITAL_BASED_OUTPATIENT_CLINIC_OR_DEPARTMENT_OTHER): Payer: Medicare Other

## 2015-09-29 DIAGNOSIS — Z86718 Personal history of other venous thrombosis and embolism: Secondary | ICD-10-CM

## 2015-09-29 DIAGNOSIS — D649 Anemia, unspecified: Secondary | ICD-10-CM

## 2015-09-29 DIAGNOSIS — I82403 Acute embolism and thrombosis of unspecified deep veins of lower extremity, bilateral: Secondary | ICD-10-CM

## 2015-09-29 LAB — CBC WITH DIFFERENTIAL/PLATELET
BASO%: 0.6 % (ref 0.0–2.0)
Basophils Absolute: 0 10*3/uL (ref 0.0–0.1)
EOS%: 2.9 % (ref 0.0–7.0)
Eosinophils Absolute: 0.1 10*3/uL (ref 0.0–0.5)
HCT: 36.4 % (ref 34.8–46.6)
HGB: 11.7 g/dL (ref 11.6–15.9)
LYMPH%: 31.8 % (ref 14.0–49.7)
MCH: 26.3 pg (ref 25.1–34.0)
MCHC: 32.2 g/dL (ref 31.5–36.0)
MCV: 81.8 fL (ref 79.5–101.0)
MONO#: 0.4 10*3/uL (ref 0.1–0.9)
MONO%: 9.1 % (ref 0.0–14.0)
NEUT#: 2.7 10*3/uL (ref 1.5–6.5)
NEUT%: 55.6 % (ref 38.4–76.8)
Platelets: 285 10*3/uL (ref 145–400)
RBC: 4.45 10*6/uL (ref 3.70–5.45)
RDW: 18.8 % — ABNORMAL HIGH (ref 11.2–14.5)
WBC: 4.8 10*3/uL (ref 3.9–10.3)
lymph#: 1.5 10*3/uL (ref 0.9–3.3)

## 2015-09-29 LAB — PROTIME-INR
INR: 2.3 (ref 2.00–3.50)
Protime: 27.6 Seconds — ABNORMAL HIGH (ref 10.6–13.4)

## 2015-09-29 NOTE — Telephone Encounter (Signed)
Pt notified.onc tx request sent for f/u lab. She still has a headache and has a f/u with Dr Leanne Lovely next week.

## 2015-09-29 NOTE — Telephone Encounter (Signed)
-----   Message from Ladell Pier, MD sent at 09/29/2015 11:41 AM EDT ----- Please call patient, same coumadin, repeat PT in 2 weeks

## 2015-10-01 ENCOUNTER — Telehealth: Payer: Self-pay | Admitting: Oncology

## 2015-10-01 NOTE — Telephone Encounter (Signed)
lvm to inform pt of lab only appt

## 2015-10-13 ENCOUNTER — Other Ambulatory Visit (HOSPITAL_BASED_OUTPATIENT_CLINIC_OR_DEPARTMENT_OTHER): Payer: Medicare Other

## 2015-10-13 ENCOUNTER — Telehealth: Payer: Self-pay

## 2015-10-13 DIAGNOSIS — Z86718 Personal history of other venous thrombosis and embolism: Secondary | ICD-10-CM

## 2015-10-13 DIAGNOSIS — Z7901 Long term (current) use of anticoagulants: Secondary | ICD-10-CM

## 2015-10-13 DIAGNOSIS — I82403 Acute embolism and thrombosis of unspecified deep veins of lower extremity, bilateral: Secondary | ICD-10-CM

## 2015-10-13 LAB — PROTIME-INR
INR: 2.4 (ref 2.00–3.50)
Protime: 28.8 Seconds — ABNORMAL HIGH (ref 10.6–13.4)

## 2015-10-13 NOTE — Telephone Encounter (Signed)
Called and informed patient to continue same dose of coumadin and scheduling will contact her for a lab appt in 2 weeks to check on PT/INR levels. Pt verbalized understanding and denies any questions or concerns at this time.   POF sent for appt

## 2015-10-13 NOTE — Telephone Encounter (Signed)
-----   Message from Owens Shark, NP sent at 10/13/2015  2:21 PM EDT ----- Please have her continue same dose of Coumadin. Repeat PT/INR in 2 weeks.

## 2015-11-09 ENCOUNTER — Ambulatory Visit: Payer: Medicare Other | Admitting: Podiatry

## 2015-11-13 ENCOUNTER — Other Ambulatory Visit: Payer: Self-pay | Admitting: Internal Medicine

## 2015-11-13 DIAGNOSIS — E2839 Other primary ovarian failure: Secondary | ICD-10-CM

## 2015-11-24 ENCOUNTER — Encounter: Payer: Self-pay | Admitting: Podiatry

## 2015-11-24 ENCOUNTER — Ambulatory Visit (INDEPENDENT_AMBULATORY_CARE_PROVIDER_SITE_OTHER): Payer: Medicare Other | Admitting: Podiatry

## 2015-11-24 DIAGNOSIS — E119 Type 2 diabetes mellitus without complications: Secondary | ICD-10-CM

## 2015-11-24 DIAGNOSIS — B351 Tinea unguium: Secondary | ICD-10-CM | POA: Diagnosis not present

## 2015-11-24 DIAGNOSIS — M79676 Pain in unspecified toe(s): Secondary | ICD-10-CM | POA: Diagnosis not present

## 2015-11-24 NOTE — Patient Instructions (Signed)
Diabetes and Foot Care Diabetes may cause you to have problems because of poor blood supply (circulation) to your feet and legs. This may cause the skin on your feet to become thinner, break easier, and heal more slowly. Your skin may become dry, and the skin may peel and crack. You may also have nerve damage in your legs and feet causing decreased feeling in them. You may not notice minor injuries to your feet that could lead to infections or more serious problems. Taking care of your feet is one of the most important things you can do for yourself.  HOME CARE INSTRUCTIONS  Wear shoes at all times, even in the house. Do not go barefoot. Bare feet are easily injured.  Check your feet daily for blisters, cuts, and redness. If you cannot see the bottom of your feet, use a mirror or ask someone for help.  Wash your feet with warm water (do not use hot water) and mild soap. Then pat your feet and the areas between your toes until they are completely dry. Do not soak your feet as this can dry your skin.  Apply a moisturizing lotion or petroleum jelly (that does not contain alcohol and is unscented) to the skin on your feet and to dry, brittle toenails. Do not apply lotion between your toes.  Trim your toenails straight across. Do not dig under them or around the cuticle. File the edges of your nails with an emery board or nail file.  Do not cut corns or calluses or try to remove them with medicine.  Wear clean socks or stockings every day. Make sure they are not too tight. Do not wear knee-high stockings since they may decrease blood flow to your legs.  Wear shoes that fit properly and have enough cushioning. To break in new shoes, wear them for just a few hours a day. This prevents you from injuring your feet. Always look in your shoes before you put them on to be sure there are no objects inside.  Do not cross your legs. This may decrease the blood flow to your feet.  If you find a minor scrape,  cut, or break in the skin on your feet, keep it and the skin around it clean and dry. These areas may be cleansed with mild soap and water. Do not cleanse the area with peroxide, alcohol, or iodine.  When you remove an adhesive bandage, be sure not to damage the skin around it.  If you have a wound, look at it several times a day to make sure it is healing.  Do not use heating pads or hot water bottles. They may burn your skin. If you have lost feeling in your feet or legs, you may not know it is happening until it is too late.  Make sure your health care provider performs a complete foot exam at least annually or more often if you have foot problems. Report any cuts, sores, or bruises to your health care provider immediately. SEEK MEDICAL CARE IF:   You have an injury that is not healing.  You have cuts or breaks in the skin.  You have an ingrown nail.  You notice redness on your legs or feet.  You feel burning or tingling in your legs or feet.  You have pain or cramps in your legs and feet.  Your legs or feet are numb.  Your feet always feel cold. SEEK IMMEDIATE MEDICAL CARE IF:   There is increasing redness,   swelling, or pain in or around a wound.  There is a red line that goes up your leg.  Pus is coming from a wound.  You develop a fever or as directed by your health care provider.  You notice a bad smell coming from an ulcer or wound.   This information is not intended to replace advice given to you by your health care provider. Make sure you discuss any questions you have with your health care provider.   Document Released: 03/03/2000 Document Revised: 11/06/2012 Document Reviewed: 08/13/2012 Elsevier Interactive Patient Education 2016 [employer]  

## 2015-11-24 NOTE — Progress Notes (Signed)
Patient ID: [name], female   DOB: Oct 21, 1955, 60 y.o.   MRN: [id number]     Subjective: This patient presents today complaining of thickened and elongated toenails which are uncomfortable walking wearing shoes and she requests toenail debridement.  Objective:  Orientated 3  Vascular: No peripheral edema noted bilaterally DP and PT pulses 2/4 bilaterally Capillary reflex immediate bilaterally  Neurological: Sensation to 10 g monofilament wire intact 5/5 bilaterally Vibratory sensation reactive bilaterally Ankle reflex equal and reactive bilaterally  Dermatological: Dry skin bilaterally No open skin lesions noted bilaterally The toenails are elongated, brittle, discolored, incurvated and tender direct palpation 6-10  Musculoskeletal: There is no restriction ankle, subtalar, midtarsal joints bilaterally No deformities noted bilaterally     Assessment: Diabetic without complication Symptomatic onychomycoses 6-10  Plan: Debridement toenails 10 mechanically  and electrically without any bleeding  Reappoint 4 months

## 2015-12-20 ENCOUNTER — Other Ambulatory Visit: Payer: Self-pay | Admitting: Oncology

## 2016-01-03 ENCOUNTER — Telehealth: Payer: Self-pay | Admitting: Oncology

## 2016-01-03 ENCOUNTER — Other Ambulatory Visit (HOSPITAL_BASED_OUTPATIENT_CLINIC_OR_DEPARTMENT_OTHER): Payer: Medicare Other

## 2016-01-03 ENCOUNTER — Ambulatory Visit (HOSPITAL_BASED_OUTPATIENT_CLINIC_OR_DEPARTMENT_OTHER): Payer: Medicare Other | Admitting: Nurse Practitioner

## 2016-01-03 VITALS — BP 151/84 | HR 95 | Temp 98.4°F | Resp 18 | Ht 66.0 in | Wt 277.5 lb

## 2016-01-03 DIAGNOSIS — I82403 Acute embolism and thrombosis of unspecified deep veins of lower extremity, bilateral: Secondary | ICD-10-CM

## 2016-01-03 DIAGNOSIS — Z7901 Long term (current) use of anticoagulants: Secondary | ICD-10-CM

## 2016-01-03 DIAGNOSIS — D6862 Lupus anticoagulant syndrome: Secondary | ICD-10-CM

## 2016-01-03 DIAGNOSIS — D649 Anemia, unspecified: Secondary | ICD-10-CM

## 2016-01-03 LAB — PROTIME-INR
INR: 1.8 — ABNORMAL LOW (ref 2.00–3.50)
Protime: 21.6 Seconds — ABNORMAL HIGH (ref 10.6–13.4)

## 2016-01-03 NOTE — Progress Notes (Signed)
  [facility] OFFICE PROGRESS NOTE   Diagnosis:  Chronic anticoagulation  INTERVAL HISTORY:   [name] returns as scheduled. She continues Coumadin. She denies bleeding. No symptom of thrombosis. She reports a history of chronic back pain. About 3 weeks ago the pain increased. She is taking tramadol and a muscle relaxant with incomplete relief.  Objective:  Vital signs in last 24 hours:  Blood pressure (!) 151/84, pulse 95, temperature 98.4 F (36.9 C), temperature source Oral, resp. rate 18, height 5\' 6"  (1.676 m), weight 277 lb 8 oz (125.9 kg), SpO2 97 %.    Resp: Lungs clear bilaterally. Cardio: Regular rate and rhythm. GI: No organomegaly. Vascular: No leg edema.   Lab Results:  Lab Results  Component Value Date   WBC 4.8 09/29/2015   HGB 11.7 09/29/2015   HCT 36.4 09/29/2015   MCV 81.8 09/29/2015   PLT 285 09/29/2015   NEUTROABS 2.7 09/29/2015    Imaging:  No results found.  Medications: I have reviewed the patient's current medications.  Assessment/Plan: 1. History of bilateral lower extremity deep vein thrombosis. She continues Coumadin followed through our office.  2. History of a positive lupus anticoagulant. 3. History of a positive beta-2-glycoprotein IgA antibody. 4. Chronic bilateral leg pain - negative bilateral Doppler January 2010. 5. History of hypertension. 6. COPD. 7. Status post hysterectomy. 8. Remote history of cocaine abuse. 9. History of an adrenal lesion on a CT scan in 2008 - status post a repeat CT on June 16, 2009, showing slow enlargement of the right adrenal nodule since 2006, most consistent with an adenoma. Stable on a CT 04/24/2014 10. History of rectal and vaginal bleeding in May 2009.  11. Status post carpal tunnel surgery. 12. 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 Right knee replacement September 2011. 13. Multiple orthopedic conditions. 14. Placement of IVC filter preoperatively before knee replacement. The  IVC filter was retrieved 03/01/2011. 15. Anemia. Ferritin in normal range at 82 on 08/18/2013 16. Status post lumbar fusion surgery October 2013 17. Right hydronephrosis-evaluated by Dr. Tresa Moore   Disposition: [name] appears stable. She will continue Coumadin at the current dose. She will return for a follow-up PT/INR in one month. We will see her back in 6 months. She will contact the office in the interim with any problems.  We contacted her PCP for a sooner appointment to evaluate the back pain.  Plan reviewed with Dr. Benay Spice.  Ned Card ANP/GNP-BC   01/03/2016  12:08 PM

## 2016-01-03 NOTE — Progress Notes (Signed)
Called Dr. Raechel Ache office. Pt worked in at [phone] today for back pain. Pt informed.

## 2016-01-03 NOTE — Telephone Encounter (Signed)
Avs report and appointment schedule given to patient, per 01/03/16 los. °

## 2016-01-14 IMAGING — MR MR LUMBAR SPINE WO/W CM
5 of 8 series · 29 of 48 positions shown · IV contrast (20ml multihance)
Comparison: CT scan 07/23/2013 and lumbar spine MRI 11/05/2011

CLINICAL DATA: Low back and right buttock and leg pain. History of
prior lumbar surgery.

BUN and creatinine were obtained on site at [HOSPITAL] at
[HOSPITAL].
Results:  BUN 8.0 mg/dL,  Creatinine 0.7 mg/dL.
EXAM:
MRI LUMBAR SPINE WITHOUT AND WITH CONTRAST
TECHNIQUE: Multiplanar and multiecho pulse sequences of the lumbar spine were
obtained without and with intravenous contrast.
CONTRAST:  20mL MULTIHANCE GADOBENATE DIMEGLUMINE 529 MG/ML IV SOLN

[Series 2: T2 · sagittal · 4.0mm · 0.44mm/px · 5 of 14 slices shown (1 of 2)]
[im 1/14]
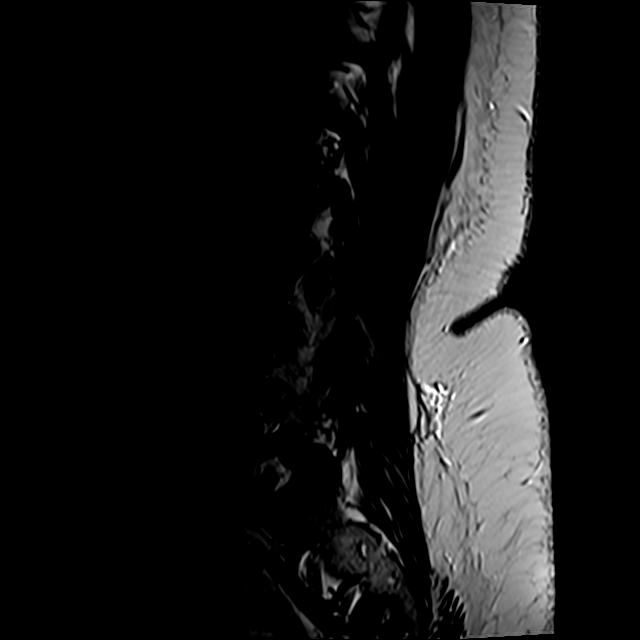
[im 4/14]
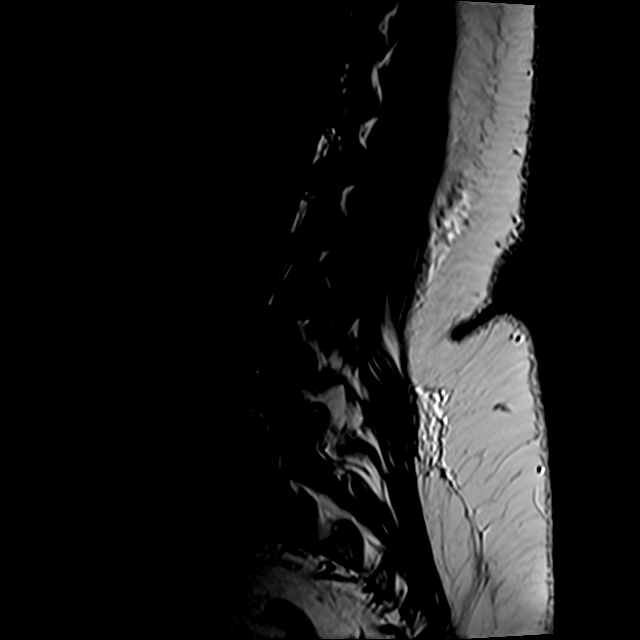
[im 7/14]
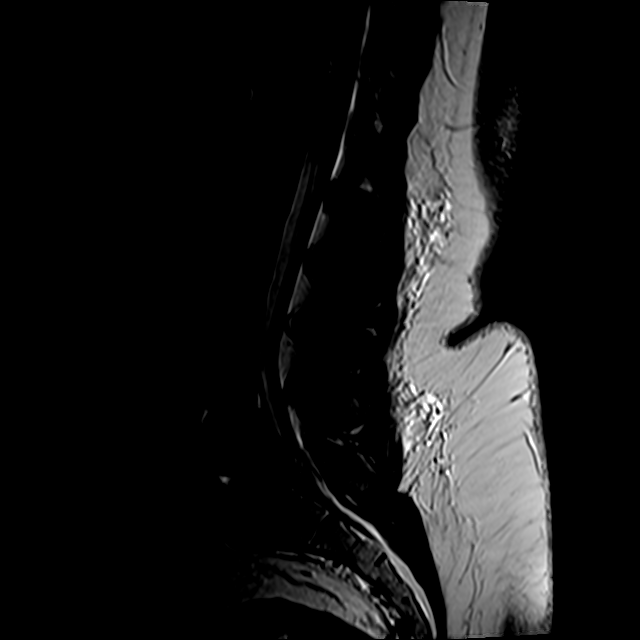
[im 10/14]
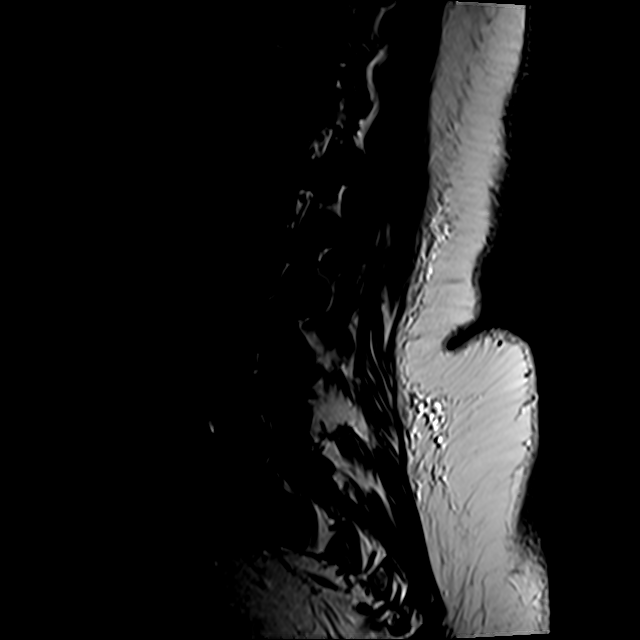
[im 14/14]
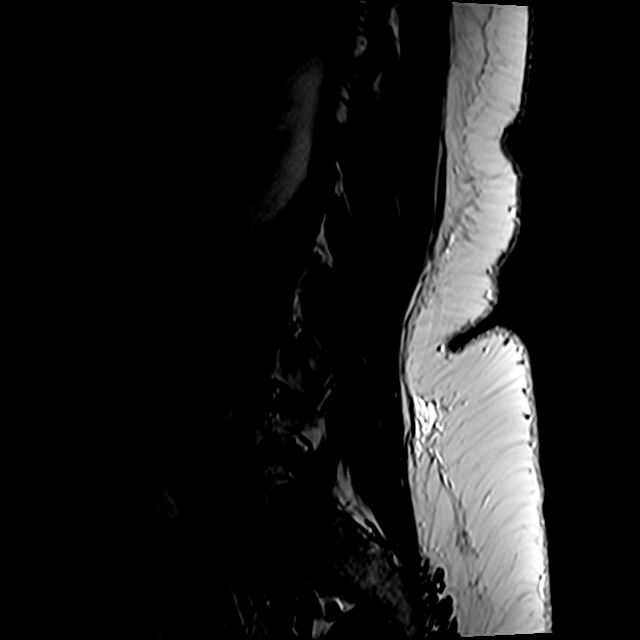

[Series 3: T1 · sagittal · 4.0mm · 0.55mm/px · 4 of 14 slices shown (1 of 2)]
[im 1/14]
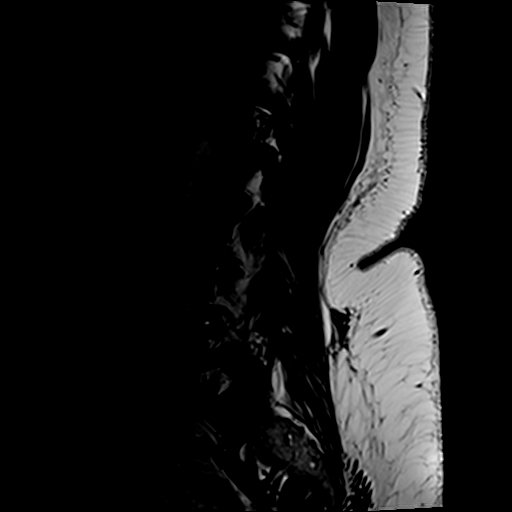
[im 5/14]
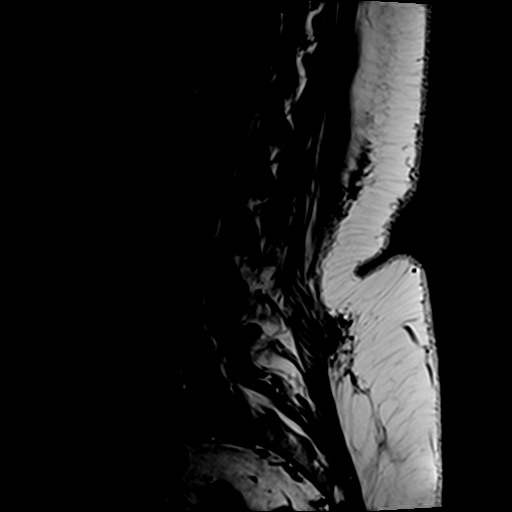
[im 9/14]
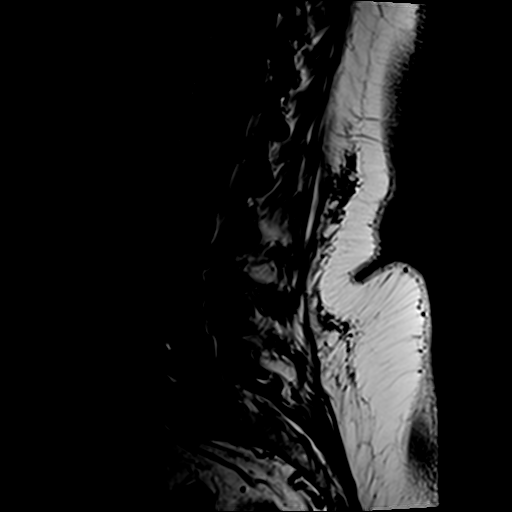
[im 14/14]
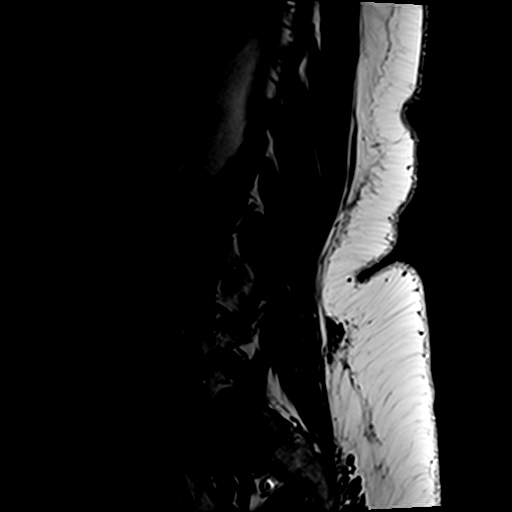

[Series 4: T2 · axial · 4.0mm · 0.62mm/px · z∈[-18,+206]mm · 9 of 34 slices shown (2 of 2)]
[im 1/34]
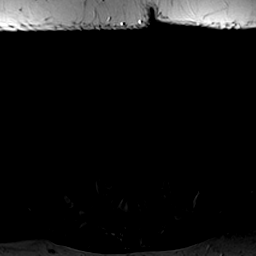
[im 5/34]
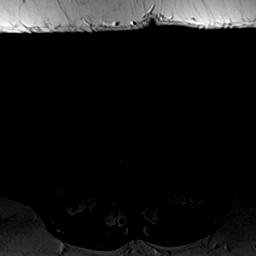
[im 9/34]
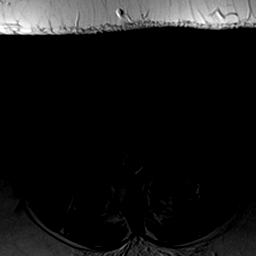
[im 13/34]
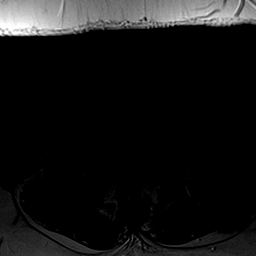
[im 17/34]
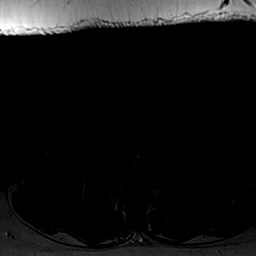
[im 21/34]
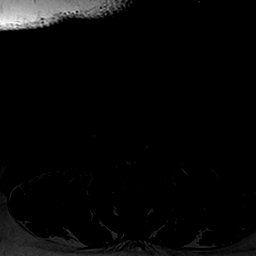
[im 25/34]
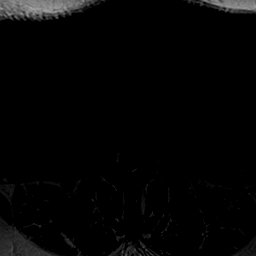
[im 29/34]
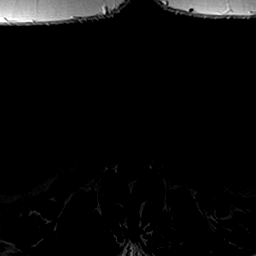
[im 34/34]
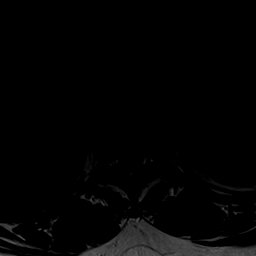

[Series 5: STIR · sagittal · 4.0mm · 0.55mm/px · 2 of 14 slices shown]
[im 1/14]
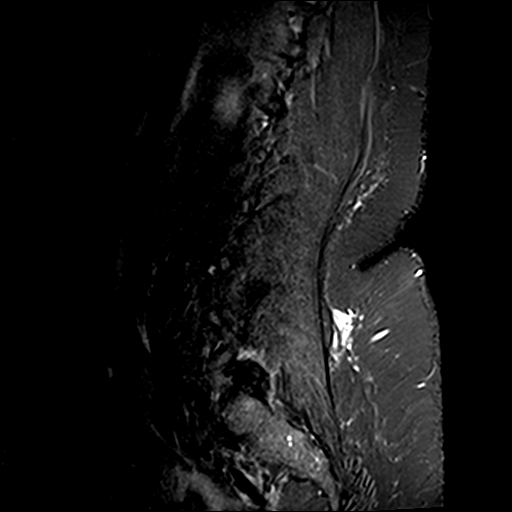
[im 5/14]
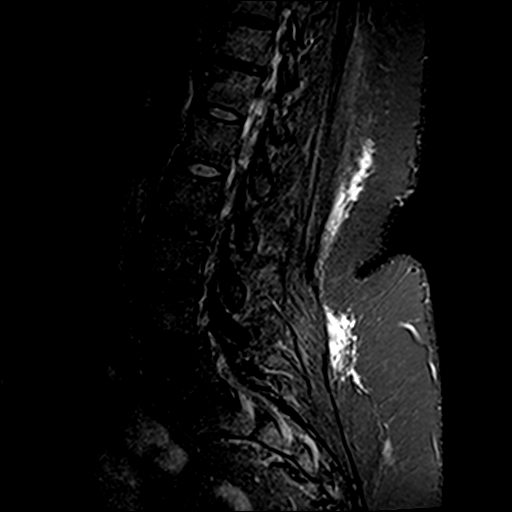

[Series 6: T1 · axial · 4.0mm · 0.62mm/px · z∈[-17,+205]mm · 9 of 34 slices shown (2 of 2)]
[im 1/34]
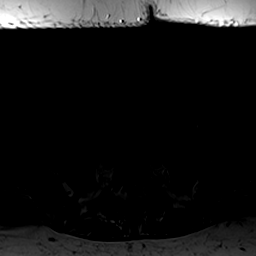
[im 5/34]
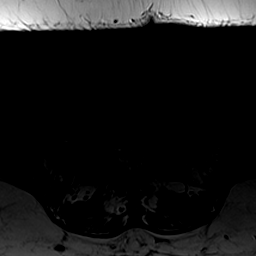
[im 9/34]
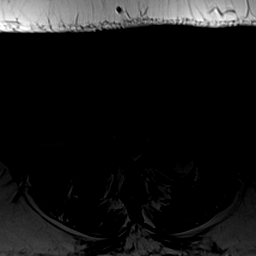
[im 13/34]
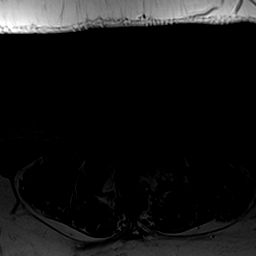
[im 17/34]
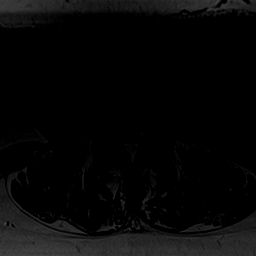
[im 21/34]
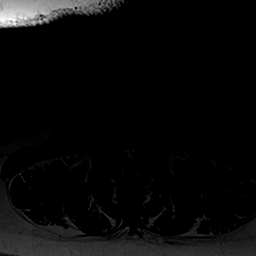
[im 25/34]
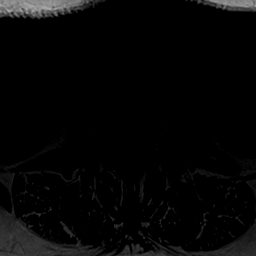
[im 29/34]
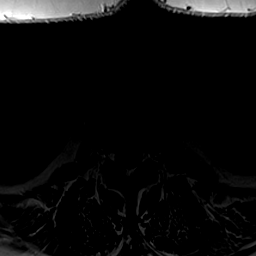
[im 34/34]
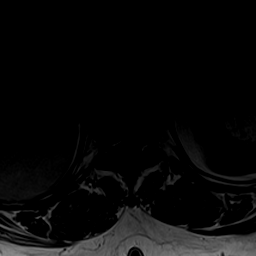

[29 of 48 positions shown; findings below may reference images not displayed]

FINDINGS: Normal overall alignment of the lumbar vertebral bodies. They
demonstrate normal marrow signal except for endplate reactive
changes and a few small scattered hemangiomas. The conus medullaris
terminates at L1. Anterior and interbody fusion changes are noted at
L4-5.

T11-12: Small central disc protrusion.  No foraminal stenosis.

T12-L1:  No significant findings.

L1-2:  No significant findings.  Mild facet disease.

L2-3: Mild annular bulge and moderate facet disease but no spinal or
foraminal stenosis.

L3-4: Mild diffuse annular bulge and moderate facet disease. There
is mild lateral recess encroachment bilaterally but no significant
spinal or foraminal stenosis.

L4-5: Anterior and interbody fusion changes. No spinal or foraminal
stenosis. Advanced facet disease.

L5-S1: Advanced facet disease. No focal disc protrusion, spinal or
foraminal stenosis.
IMPRESSION: 1. Postoperative changes at L4-5 with anterior and interbody fusion.
No complicating features. No spinal or foraminal stenosis.
2. Small central disc protrusion at T11-12.
3. Mild lateral recess encroachment bilaterally at L3-4.

## 2016-01-14 IMAGING — CT CT L SPINE W/O CM
4 of 11 series · 13 of 33 positions shown, 15 images · non-contrast
Comparison: Radiographs dated 03/24/2013 and CT scan dated
11/30/2011

CLINICAL DATA: Low back pain and right leg pain. Prior fusion at
L4-5. Lumbar spondylosis.

EXAM:
CT LUMBAR SPINE WITHOUT CONTRAST
TECHNIQUE: Multidetector CT imaging of the lumbar spine was performed without
intravenous contrast administration. Multiplanar CT image
reconstructions were also generated.

[Series 2: l spine bone · axial · 0.27mm/px · z∈[-310,-80]mm · 3 of 93 slices shown, 4 images]
[im 1/93  soft-tissue]
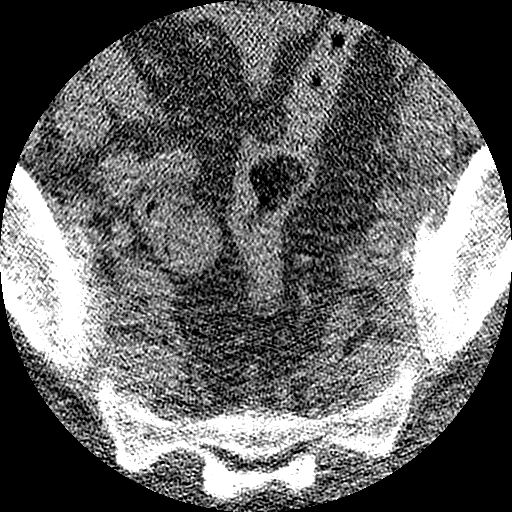
[im 1/93  bone]
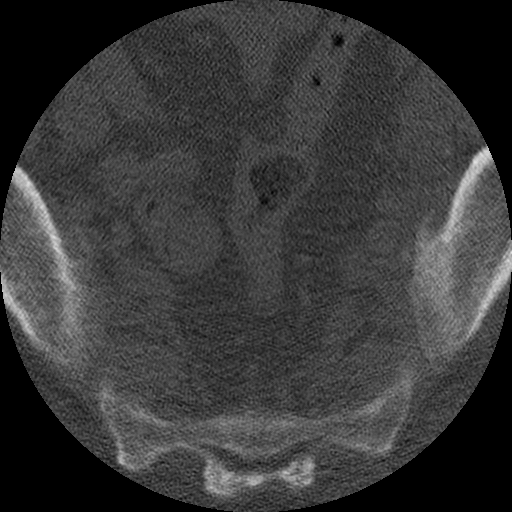
[im 47/93  bone]
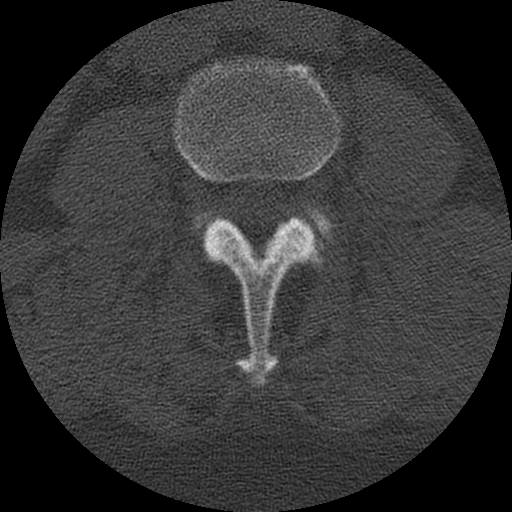
[im 93/93  bone]
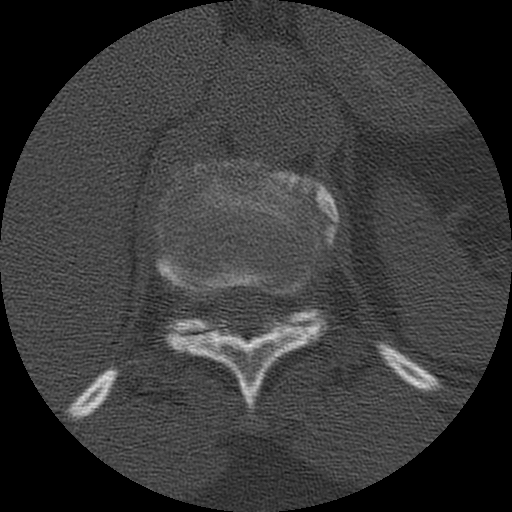

[Series 3: l spine soft · axial · 0.27mm/px · z∈[-236,-158]mm · 2 of 93 slices shown]
[im 31/93  soft-tissue]
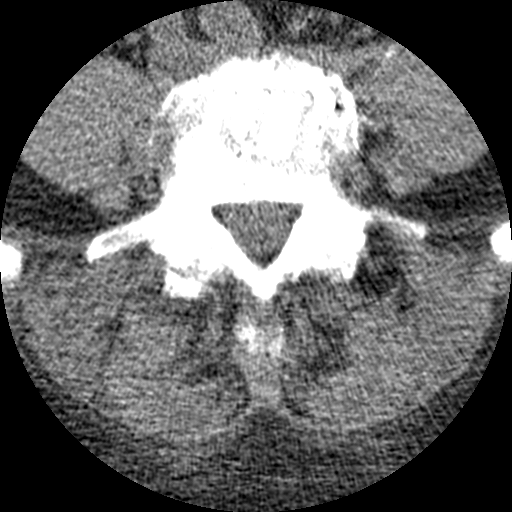
[im 62/93  soft-tissue]
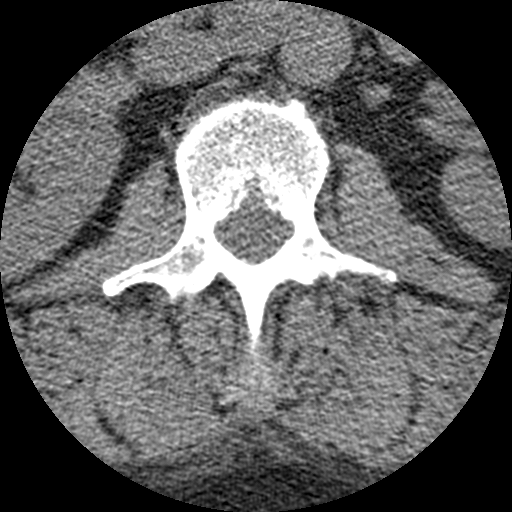

[Series 401: sag · coronal · 0.46mm/px · 3 of 67 slices shown (1 of 2)]
[im 10/67  bone]
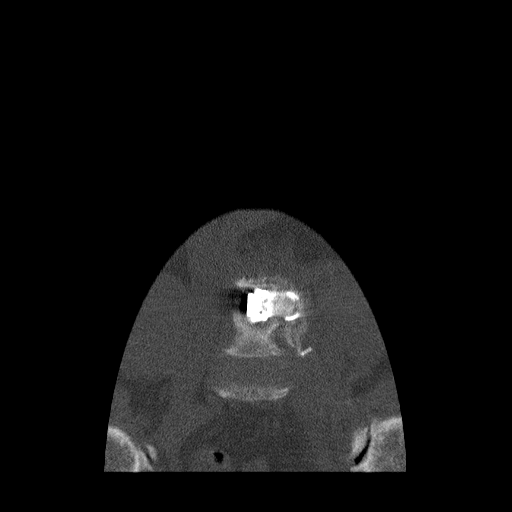
[im 34/67  bone]
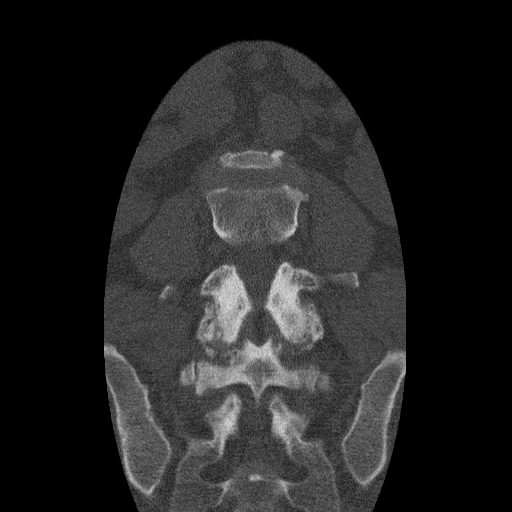
[im 58/67  bone]
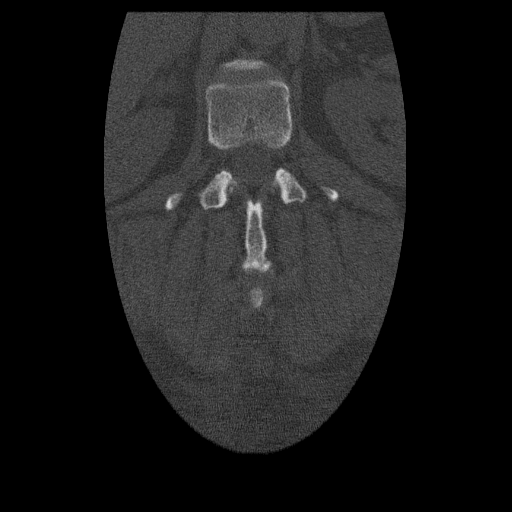

[Series 402: sag · sagittal · 0.46mm/px · 5 of 67 slices shown, 6 images (2 of 2)]
[im 23/67  bone]
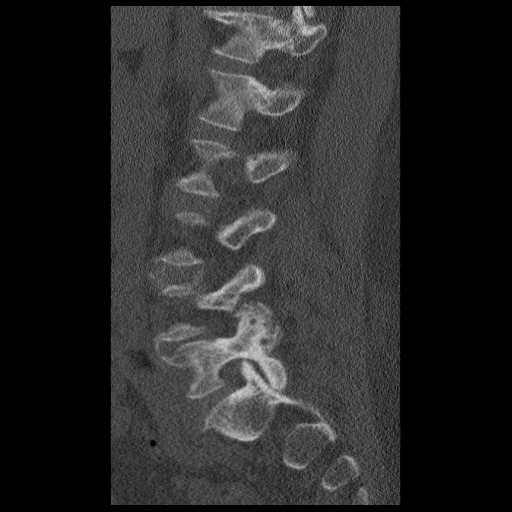
[im 28/67  bone]
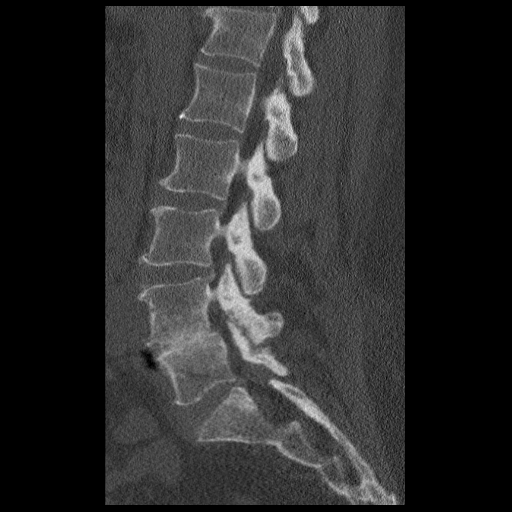
[im 34/67  soft-tissue]
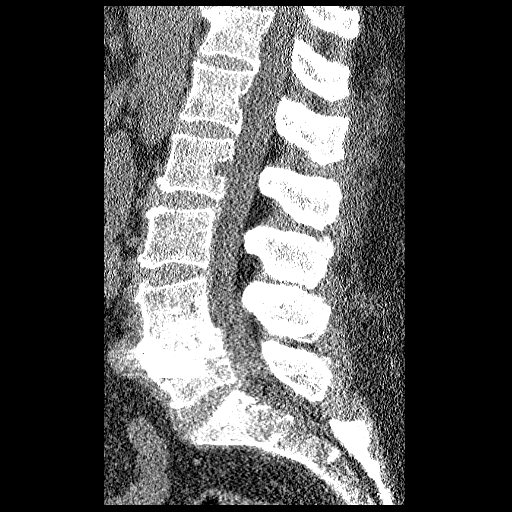
[im 34/67  bone]
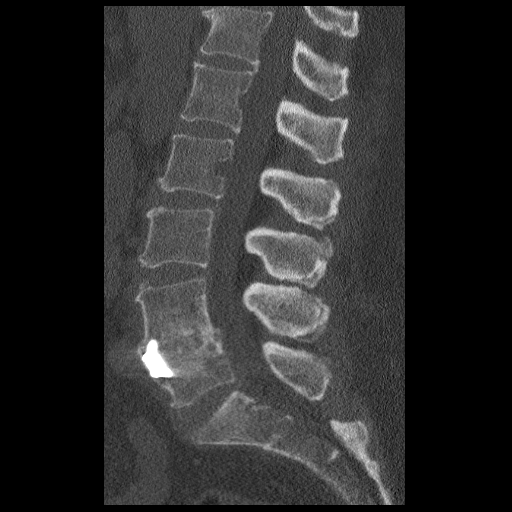
[im 39/67  bone]
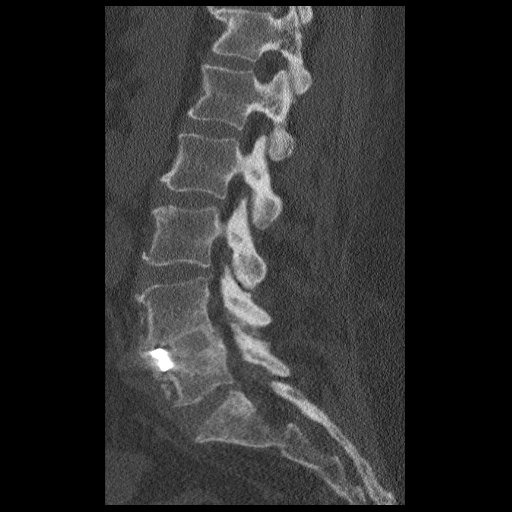
[im 45/67  bone]
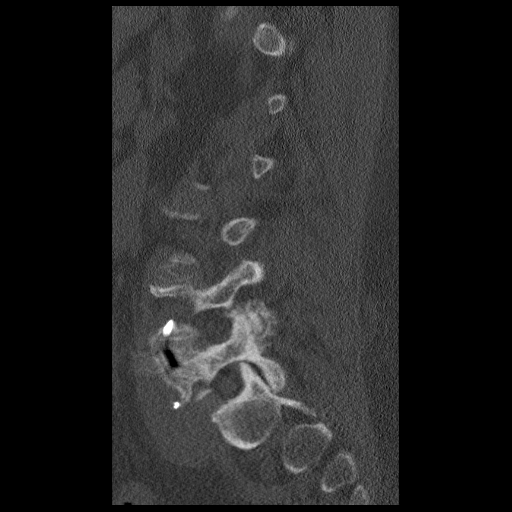

[13 of 33 positions shown; findings below may reference images not displayed]

FINDINGS: The patient has a stable 2.9 cm right adrenal adenoma. Paraspinal
soft tissues are otherwise normal.

T11-12: Tiny disc bulge into the right neural foramen without neural
impingement.

T12-L1:  Normal.

L1-2:  Normal.

L2-3:  Normal.

L3-4: Normal disc. Minimal degenerative changes of the inferior
aspect of the left facet joint, unchanged.

L4-5: Interval solid anterior, interbody and posterior fusion. The
no neural impingement. No foraminal stenosis.

L5-S1: Normal disc. Minimal degenerative changes of the facet
joints, stable.
IMPRESSION: 1. Solid fusion at L4-5 with no neural impingement.
2. Stable appearance of the remainder of the lumbar spine.
3. Mild bilateral facet arthritis at L5-S1, unchanged.

## 2016-01-17 ENCOUNTER — Other Ambulatory Visit: Payer: Medicare Other

## 2016-01-17 ENCOUNTER — Ambulatory Visit: Payer: Medicare Other

## 2016-01-31 ENCOUNTER — Other Ambulatory Visit: Payer: Medicare Other

## 2016-02-07 ENCOUNTER — Other Ambulatory Visit: Payer: Medicare Other

## 2016-02-07 ENCOUNTER — Ambulatory Visit: Payer: Medicare Other

## 2016-02-08 IMAGING — CR DG CHEST 1V PORT
1 series · 1 of 1 positions shown · non-contrast
Comparison: 02/05/2012; 12/26/2011; 12/02/2010

CLINICAL DATA: Extreme shortness of breath, cough, history of heart
murmur, COPD, diabetes, hypertension

EXAM:
PORTABLE CHEST - 1 VIEW

[AP]
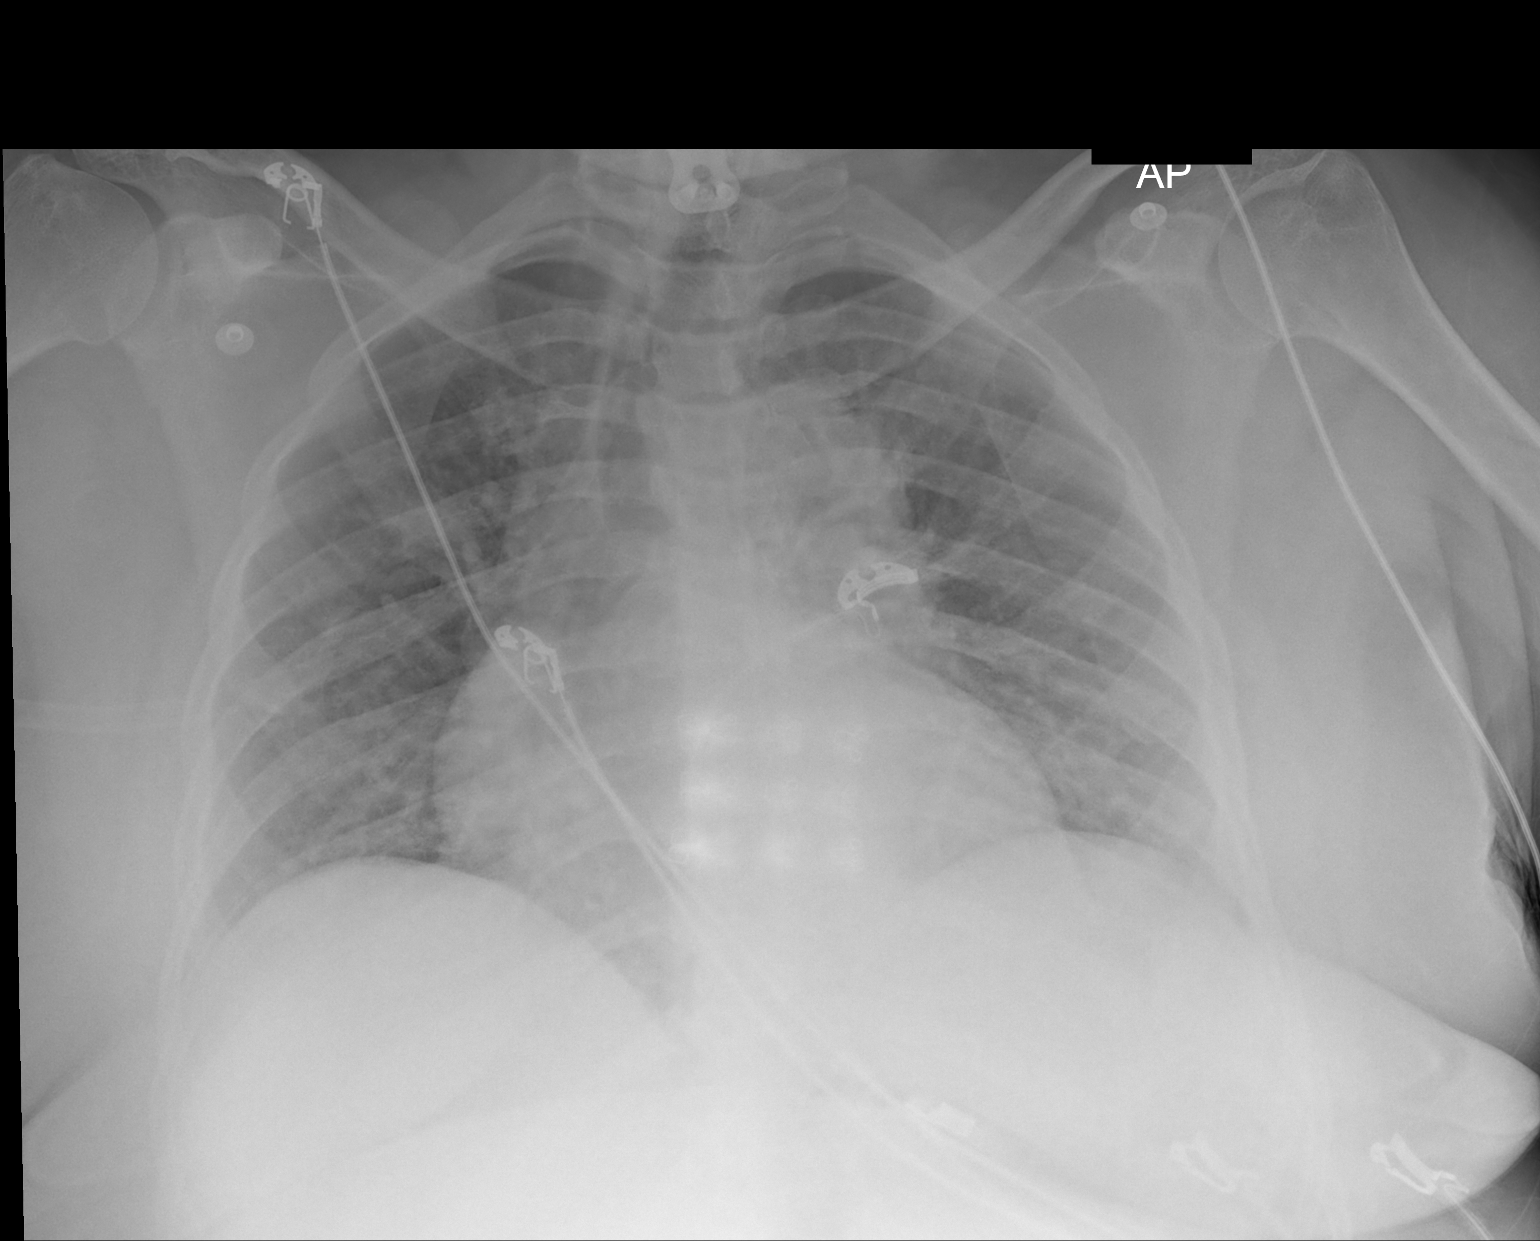

[1 of 1 positions shown; findings below may reference images not displayed]

FINDINGS: Grossly unchanged enlarged cardiac silhouette and mediastinal
contours given decreased lung volumes and patient rotation. There is
persistent rightward deviation of the tracheal air column at the
level of the aortic arch possibly accentuated due to rotation. Mild
pulmonary venous congestion without frank evidence of edema.
Worsening peri and infrahilar heterogeneous opacities. No focal
airspace opacities. No pleural effusion or pneumothorax. Unchanged
bones including lower cervical ACDF.
IMPRESSION: Cardiomegaly, pulmonary venous congestion and perihilar atelectasis
without acute cardiopulmonary disease on this hypoventilated AP
portable examination. Further evaluation with a PA and lateral chest
radiograph may be obtained as clinically indicated.

## 2016-02-10 IMAGING — CR DG ABDOMEN 2V
3 series · 3 of 3 positions shown · non-contrast
Comparison: 02/02/2012.  Chest x-ray 08/17/2013

CLINICAL DATA: Right lower abdominal pain.  History of gallstones.

EXAM:
ABDOMEN - 2 VIEW

[w abdomen upright *]
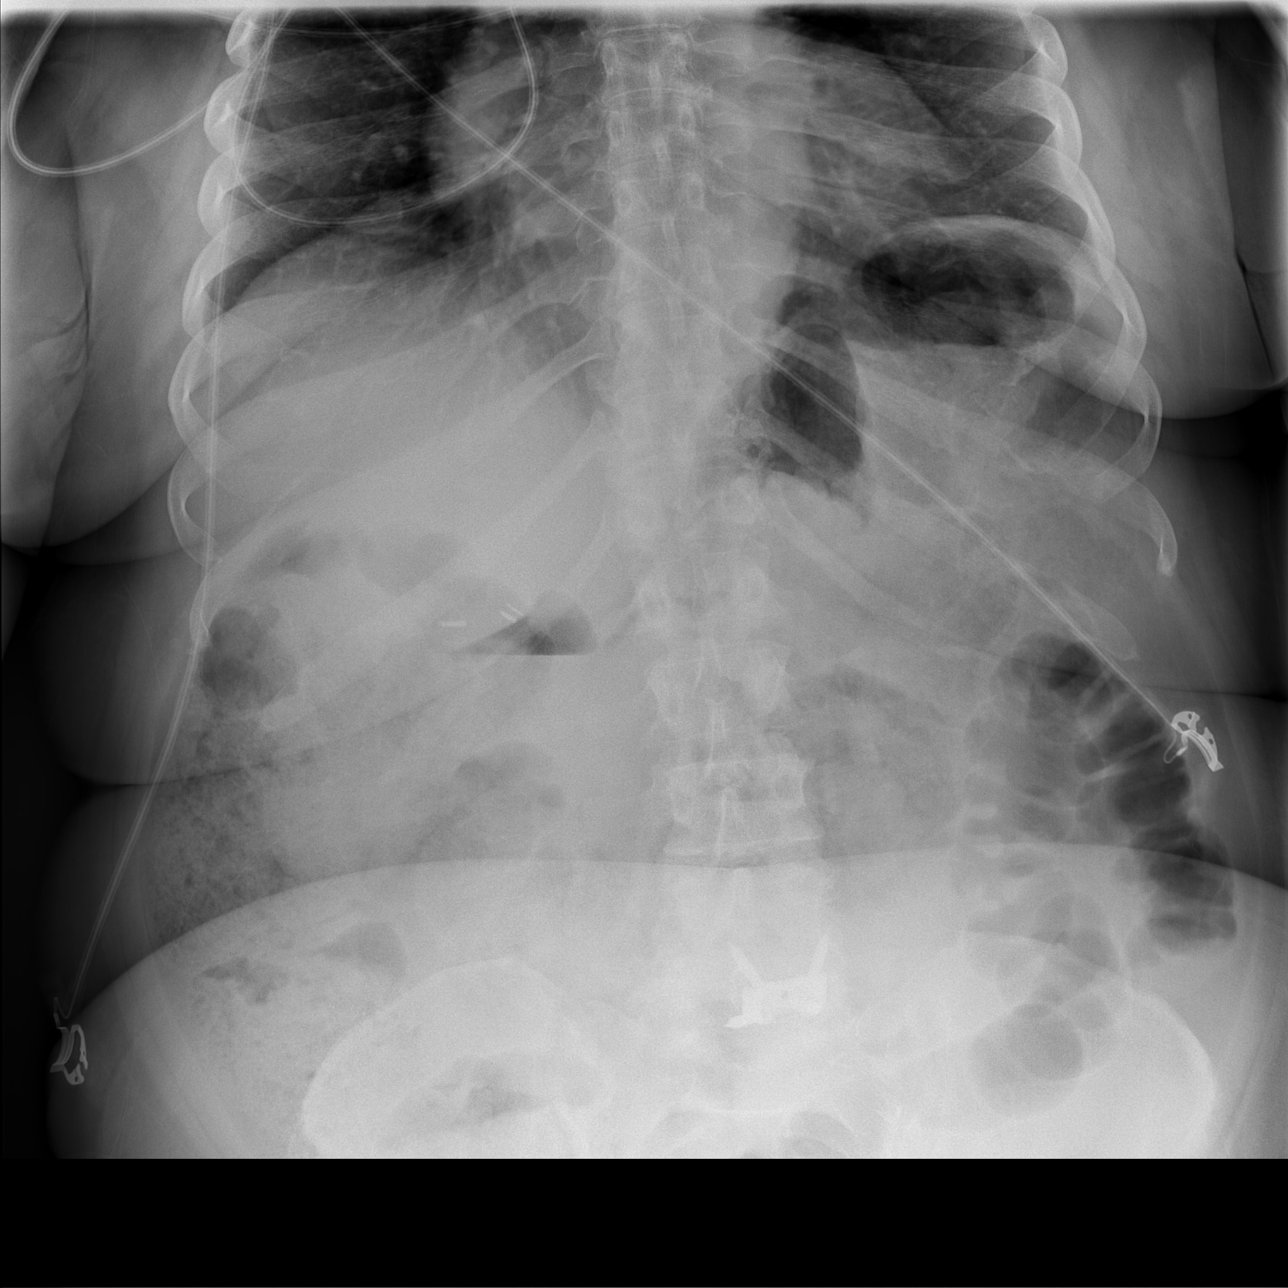

[t abdomen supine (1 of 2)]
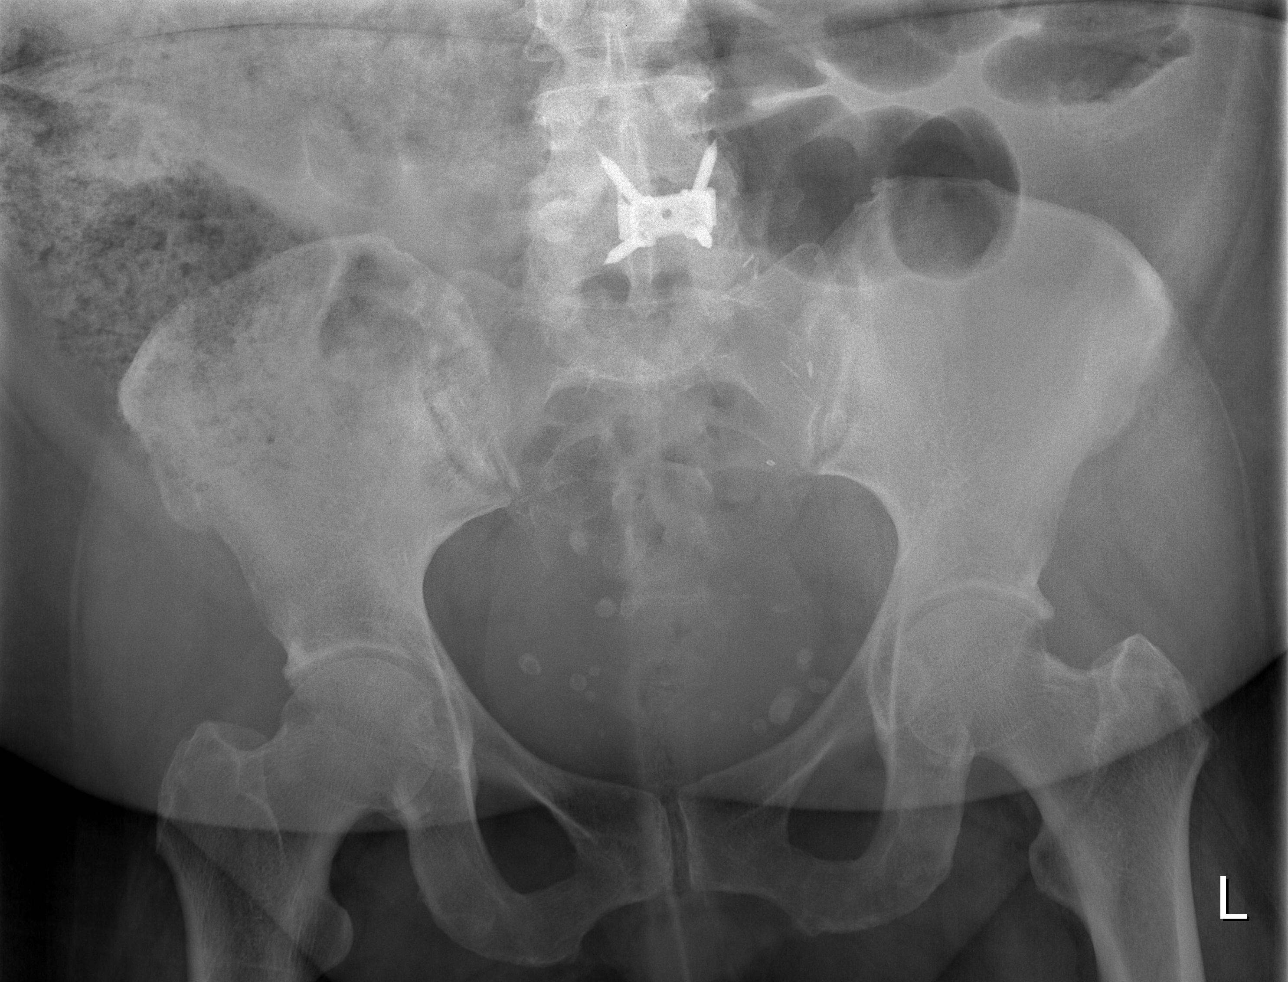

[t abdomen supine (2 of 2)]
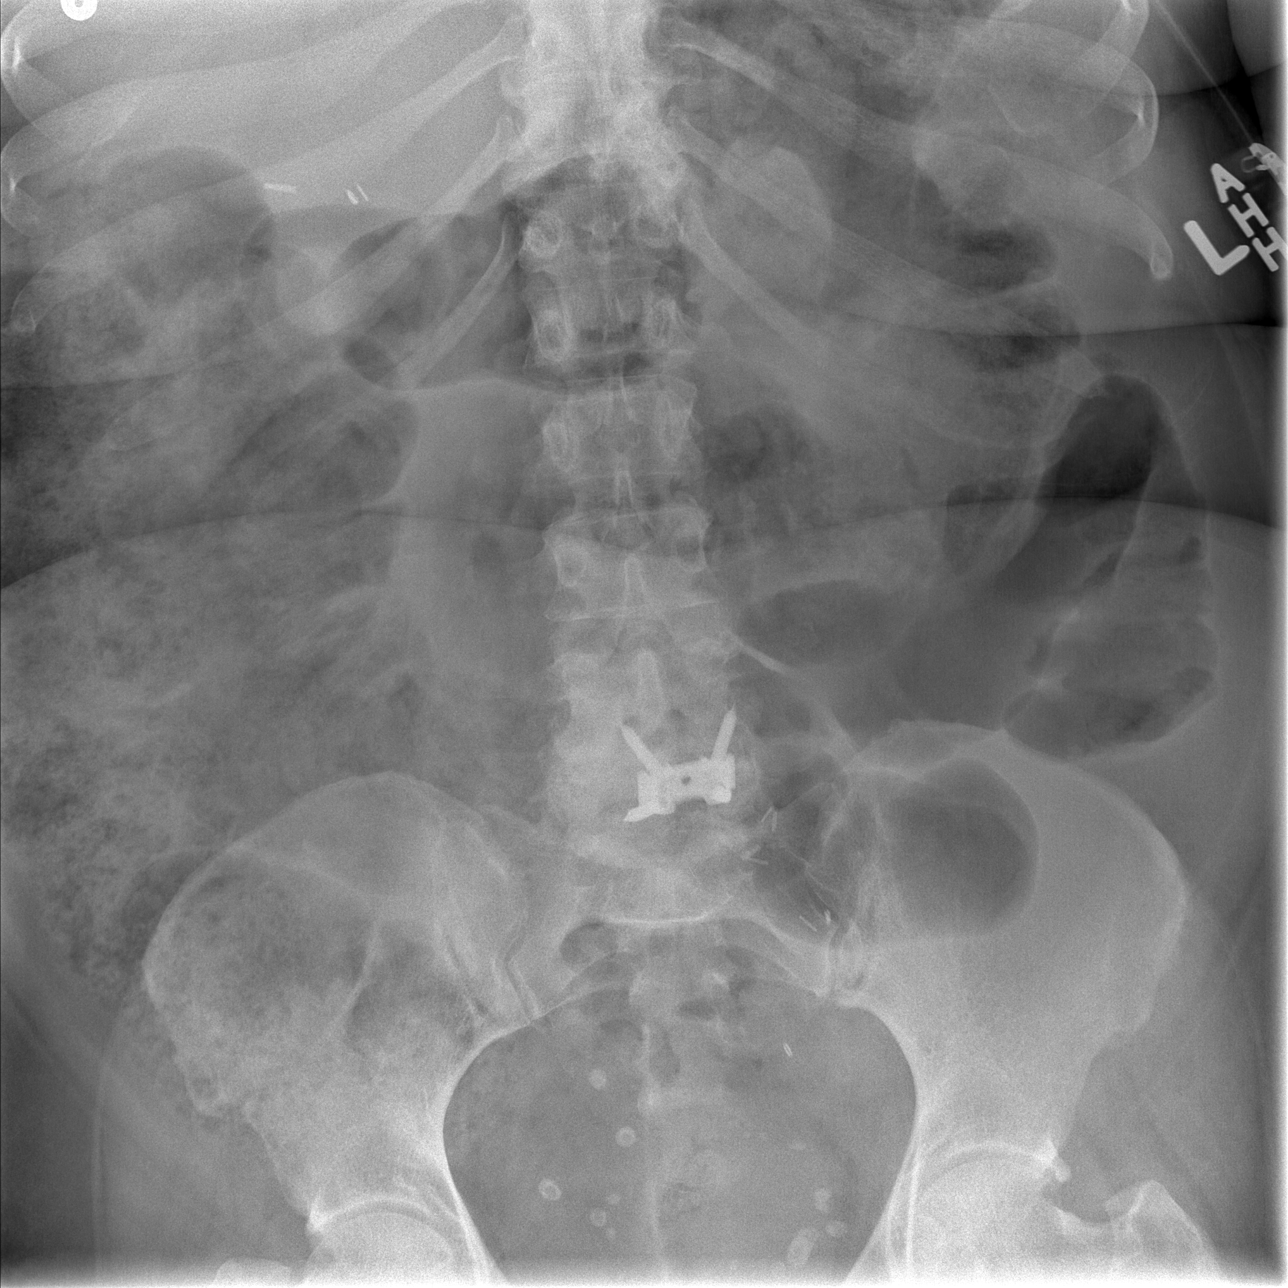

[3 of 3 positions shown; findings below may reference images not displayed]

FINDINGS: Moderate stool throughout the colon with mild gaseous distention. No
evidence of bowel obstruction. No free air organomegaly. Prior
cholecystectomy. Calcified phleboliths in the anatomic pelvis. No
acute bony abnormality.
IMPRESSION: Moderate gas and stool throughout the colon. No evidence of bowel
obstruction or free air.

## 2016-02-18 ENCOUNTER — Ambulatory Visit
Admission: RE | Admit: 2016-02-18 | Discharge: 2016-02-18 | Disposition: A | Discharge status: Home / Self Care | Payer: Medicare Other | Source: Ambulatory Visit | Attending: Internal Medicine | Admitting: Internal Medicine

## 2016-02-18 DIAGNOSIS — E2839 Other primary ovarian failure: Secondary | ICD-10-CM

## 2016-02-18 DIAGNOSIS — Z1231 Encounter for screening mammogram for malignant neoplasm of breast: Secondary | ICD-10-CM

## 2016-02-21 ENCOUNTER — Other Ambulatory Visit: Payer: Self-pay | Admitting: *Deleted

## 2016-02-21 ENCOUNTER — Telehealth: Payer: Self-pay | Admitting: *Deleted

## 2016-02-21 DIAGNOSIS — I82403 Acute embolism and thrombosis of unspecified deep veins of lower extremity, bilateral: Secondary | ICD-10-CM

## 2016-02-21 NOTE — Telephone Encounter (Signed)
Message received from patient requesting lab appt.  Pt was a no show for labs on 01/31/16.  Message sent to scheduling to get pt lab appt this week and call patient.

## 2016-02-24 ENCOUNTER — Other Ambulatory Visit: Payer: Self-pay | Admitting: *Deleted

## 2016-02-24 ENCOUNTER — Telehealth: Payer: Self-pay

## 2016-02-24 ENCOUNTER — Other Ambulatory Visit (HOSPITAL_BASED_OUTPATIENT_CLINIC_OR_DEPARTMENT_OTHER): Payer: Medicare Other

## 2016-02-24 DIAGNOSIS — I82403 Acute embolism and thrombosis of unspecified deep veins of lower extremity, bilateral: Secondary | ICD-10-CM | POA: Diagnosis present

## 2016-02-24 LAB — PROTIME-INR
INR: 1.1 — ABNORMAL LOW (ref 2.00–3.50)
Protime: 13.2 Seconds (ref 10.6–13.4)

## 2016-02-24 NOTE — Telephone Encounter (Signed)
Left message for pt to call back regarding lab results.    Per MD, pt to increase coumadin to 10mg  MWF and 5mg  on other days. Will repeat PT/INR in 2 weeks.  Scheduling message and orders placed.

## 2016-03-07 ENCOUNTER — Other Ambulatory Visit: Payer: Self-pay | Admitting: Oncology

## 2016-03-09 ENCOUNTER — Telehealth: Payer: Self-pay | Admitting: *Deleted

## 2016-03-09 ENCOUNTER — Other Ambulatory Visit: Payer: Self-pay | Admitting: Oncology

## 2016-03-09 ENCOUNTER — Other Ambulatory Visit (HOSPITAL_BASED_OUTPATIENT_CLINIC_OR_DEPARTMENT_OTHER): Payer: Medicare Other

## 2016-03-09 DIAGNOSIS — I82403 Acute embolism and thrombosis of unspecified deep veins of lower extremity, bilateral: Secondary | ICD-10-CM

## 2016-03-09 LAB — PROTIME-INR
INR: 2 (ref 2.00–3.50)
Protime: 24 Seconds — ABNORMAL HIGH (ref 10.6–13.4)

## 2016-03-09 NOTE — Telephone Encounter (Signed)
INR reviewed by Ned Card, NP: Continue same dose, repeat in 2 weeks. Pt notified, she voiced understanding. Informed her refill was sent to [facility].

## 2016-03-11 ENCOUNTER — Other Ambulatory Visit: Payer: Self-pay | Admitting: Oncology

## 2016-03-29 ENCOUNTER — Ambulatory Visit: Payer: Medicare Other | Admitting: Podiatry

## 2016-04-12 ENCOUNTER — Encounter: Payer: Self-pay | Admitting: Podiatry

## 2016-04-12 ENCOUNTER — Ambulatory Visit (INDEPENDENT_AMBULATORY_CARE_PROVIDER_SITE_OTHER): Payer: Medicare Other | Admitting: Podiatry

## 2016-04-12 VITALS — BP 156/87 | HR 82 | Resp 18

## 2016-04-12 DIAGNOSIS — E119 Type 2 diabetes mellitus without complications: Secondary | ICD-10-CM

## 2016-04-12 DIAGNOSIS — M79676 Pain in unspecified toe(s): Secondary | ICD-10-CM | POA: Diagnosis not present

## 2016-04-12 DIAGNOSIS — B351 Tinea unguium: Secondary | ICD-10-CM

## 2016-04-12 NOTE — Patient Instructions (Signed)

## 2016-04-13 NOTE — Progress Notes (Signed)
Patient ID: [name], female   DOB: 1955-04-12, 61 y.o.   MRN: [id number]    Subjective: This patient presents today complaining of thickened and elongated toenails which are uncomfortable walking wearing shoes and  requests toenail debridement.  Objective:  Orientated 3  Vascular: No peripheral edema noted bilaterally DP and PT pulses 2/4 bilaterally Capillary reflex immediate bilaterally  Neurological: Sensation to 10 g monofilament wire intact 5/5 bilaterally Vibratory sensation reactive bilaterally Ankle reflex equal and reactive bilaterally  Dermatological: Dry skin bilaterally No open skin lesions noted bilaterally The toenails are elongated, brittle, discolored, incurvated and tender direct palpation 6-10  Musculoskeletal: There is no restriction ankle, subtalar, midtarsal joints bilaterally No deformities noted bilaterally     Assessment: Diabetic without complication Symptomatic onychomycoses 6-10  Plan: Debridement toenails 10 mechanically  and electrically without any bleeding  Reappoint 3 months

## 2016-04-24 ENCOUNTER — Encounter (INDEPENDENT_AMBULATORY_CARE_PROVIDER_SITE_OTHER): Payer: Self-pay | Admitting: Orthopedic Surgery

## 2016-04-24 ENCOUNTER — Ambulatory Visit (INDEPENDENT_AMBULATORY_CARE_PROVIDER_SITE_OTHER): Payer: Medicare Other

## 2016-04-24 ENCOUNTER — Ambulatory Visit (INDEPENDENT_AMBULATORY_CARE_PROVIDER_SITE_OTHER): Payer: Medicare Other | Admitting: Orthopedic Surgery

## 2016-04-24 DIAGNOSIS — G8929 Other chronic pain: Secondary | ICD-10-CM | POA: Diagnosis not present

## 2016-04-24 DIAGNOSIS — Z96651 Presence of right artificial knee joint: Secondary | ICD-10-CM

## 2016-04-24 DIAGNOSIS — M25561 Pain in right knee: Secondary | ICD-10-CM | POA: Diagnosis not present

## 2016-04-24 LAB — CBC WITH DIFFERENTIAL/PLATELET
Basophils Absolute: 0 cells/uL (ref 0–200)
Basophils Relative: 0 %
Eosinophils Absolute: 126 cells/uL (ref 15–500)
Eosinophils Relative: 2 %
HCT: 34.8 % — ABNORMAL LOW (ref 35.0–45.0)
Hemoglobin: 11 g/dL — ABNORMAL LOW (ref 11.7–15.5)
Lymphocytes Relative: 29 %
Lymphs Abs: 1827 cells/uL (ref 850–3900)
MCH: 25.9 pg — ABNORMAL LOW (ref 27.0–33.0)
MCHC: 31.6 g/dL — ABNORMAL LOW (ref 32.0–36.0)
MCV: 81.9 fL (ref 80.0–100.0)
MPV: 10.4 fL (ref 7.5–12.5)
Monocytes Absolute: 567 cells/uL (ref 200–950)
Monocytes Relative: 9 %
Neutro Abs: 3780 cells/uL (ref 1500–7800)
Neutrophils Relative %: 60 %
Platelets: 422 10*3/uL — ABNORMAL HIGH (ref 140–400)
RBC: 4.25 MIL/uL (ref 3.80–5.10)
RDW: 17.7 % — ABNORMAL HIGH (ref 11.0–15.0)
WBC: 6.3 10*3/uL (ref 3.8–10.8)

## 2016-04-24 NOTE — Progress Notes (Signed)
Office Visit Note   Patient: [name]           Date of Birth: 03/29/1955           MRN: [id number] Visit Date: 04/24/2016 Requested by: [name], MD [address] [place], [place] [address] PCP: Philis Fendt, MD  Subjective: Chief Complaint  Patient presents with  . Right Knee - Pain    HPI there is a 61 year old female with right knee pain.  She's had both knees replaced.  She reports pain in the right knee medial and lateral to the patella.  She relates with a cane.  The pain is increased over the past 2 months.  Denies any history of injury.  Does have a history of remote inferior pole patella fracture.  Taking tramadol per her primary care provider.  States that the knee will give out at times.  She does have a history of DVT and she is on Coumadin or comparable blood thinner.              Review of Systems All systems reviewed are negative as they relate to the chief complaint within the history of present illness.  Patient denies  fevers or chills.    Assessment & Plan: Visit Diagnoses:  1. Presence of right artificial knee joint   2. Chronic pain of right knee     Plan: Impression is right knee pain without effusion.  Differential diagnosis includes loosening or occult infection.  Patella appears well centered and therefore I don't think there is any issue with patellar instability.  I'm going to redraw sedimentation rate CBC just C-reactive protein to evaluate for possibility of infection.  If those numbers are elevated I would favor an attempt at aspiration of the knee even though she doesn't really have much fluid.  I'll call her with those results and we can make plans from there.  Components do not appear loose at this time.  On radiographs.  Follow-Up Instructions: No Follow-up on file.   Orders:  Orders Placed This Encounter  Procedures  . XR KNEE 3 VIEW RIGHT  . Sed Rate (ESR)  . CBC with Differential  . C-reactive protein   No orders of the  defined types were placed in this encounter.     Procedures: No procedures performed   Clinical Data: No additional findings.  Objective: Vital Signs: There were no vitals taken for this visit.  Physical Exam   Constitutional: Patient appears well-developed HEENT:  Head: Normocephalic Eyes:EOM are normal Neck: Normal range of motion Cardiovascular: Normal rate Pulmonary/chest: Effort normal Neurologic: Patient is alert Skin: Skin is warm Psychiatric: Patient has normal mood and affect    Ortho Exam examination of the right knee demonstrates excellent range of motion no effusion no real instability either patella or collateral ligament.  Slight warmth right knee versus left no proximal lymphadenopathy extensor mechanism is intact and nontender no other masses lymph adenopathy or skin changes noted in the right knee region  Specialty Comments:  No specialty comments available.  Imaging: Xr Knee 3 View Right  Result Date: 04/24/2016 3 views right knee reviewed AP lateral and merchant.  No evidence of hardware loosening.  No evidence of lucency around the bone cement interface.  Extensor mechanism appears intact by position of the patella.  Prior inferior pole patella fracture appears well-healed    PMFS History: Patient Active Problem List   Diagnosis Date Noted  . Abnormal LFTs   . Decreased sensation   .  Paresthesia 06/12/2014  . Obstruction of kidney 04/24/2014  . Diabetes type 2, uncontrolled ([place]) 04/24/2014  . Chronic anticoagulation 04/24/2014  . Crack cocaine use 04/24/2014  . Depression 04/24/2014  . Fibromyalgia 04/24/2014  . COPD (chronic obstructive pulmonary disease) ([place]) 04/24/2014  . Obstructive sleep apnea on CPAP 04/24/2014  . Lupus anticoagulant positive 04/24/2014  . Adrenal mass, right ([place]) 04/24/2014  . Right kidney mass 04/24/2014  . Hydronephrosis of right kidney 04/24/2014  . Supratherapeutic INR 04/24/2014  . Renal mass, right  04/24/2014  . Diabetes type 2, controlled ([place])   . Right lower quadrant abdominal pain   . Morbid obesity ([place]) 08/21/2013  . Unspecified constipation 08/19/2013  . COPD exacerbation ([place]) 08/17/2013  . Morbid obesity with BMI of 45.0-49.9, adult ([place]) 01/05/2012  . Bilateral deep vein thromboses ([place]) 12/13/2011  . Abdominal pain 11/05/2011  . Hypokalemia 11/05/2011  . Sciatica 11/05/2011  . Type II or unspecified type diabetes mellitus without mention of complication, not stated as uncontrolled 11/05/2011  . HTN (hypertension) 11/05/2011   Past Medical History:  Diagnosis Date  . Adrenal mass ([place]) 03/226   Benign  . Arthritis    knees/multiple orthopedic conditons; lower back  . Asthma    per pt  . 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 Depression   . Diabetes mellitus    120s usually fasting  . Dizziness, nonspecific   . DVT (deep venous thrombosis) (HCC)    Recurrent  . Fibromyalgia   . GERD (gastroesophageal reflux disease)   . Heart murmur   . History of blood transfusion   . History of cocaine abuse    Remote history   . Hypertension    takes meds daily  . Lupus Anticoagulant Positive   . On home oxygen therapy    at night  . Shortness of breath    exertion or lying flat  . Sleep apnea    2l of oxygen at night    Family History  Problem Relation Age of Onset  . Hypertension Father   . Heart failure Father   . Hypertension Mother   . Diabetes Mother   . Cancer Mother     Pancreatic    Past Surgical History:  Procedure Laterality Date  . ABDOMINAL HYSTERECTOMY  1989   Fibroids  . ANTERIOR LUMBAR FUSION  01/03/2012   Procedure: ANTERIOR LUMBAR FUSION 1 LEVEL;  Surgeon: Winfield Cunas, MD;  Location: [facility];  Service: Neurosurgery;  Laterality: N/A;  Lumbar Four-Five Anterior Lumbar Interbody Fusion with Instrumentation  . BACK  SURGERY     cervical spine---disk disease  . CARPAL TUNNEL RELEASE     Bilateral  . CESAREAN SECTION    . CYSTOSCOPY/RETROGRADE/URETEROSCOPY/STONE EXTRACTION WITH BASKET Right 04/26/2014   Procedure: CYSTOSCOPY/ RIGHT RETROGRADE/ RIGHT URETEROSCOPY/URETERAL AND RENAL PELVIS BIOPSY;  Surgeon: Alexis Frock, MD;  Location: [facility];  Service: Urology;  Laterality: Right;  . gallstones removed    . 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   bilateral  . right elbow surgery    . TUBAL LIGATION     Social History   Occupational History  . Not on file.   Social History Main Topics  . Smoking status: Former Smoker    Start date: 03/20/2001    Quit date: 03/21/2003  . Smokeless tobacco: Never Used     Comment: smoked for 2 years  .  Alcohol use No     Comment: beer in past   . Drug use: Yes    Types: Cocaine     Comment: quit 2003-rehab progam in 2005  . Sexual activity: No

## 2016-04-25 LAB — C-REACTIVE PROTEIN: CRP: 33.6 mg/L — ABNORMAL HIGH (ref <8.0)

## 2016-04-25 LAB — SEDIMENTATION RATE: Sed Rate: 61 mm/hr — ABNORMAL HIGH (ref 0–30)

## 2016-04-28 ENCOUNTER — Telehealth: Payer: Self-pay | Admitting: *Deleted

## 2016-04-28 ENCOUNTER — Ambulatory Visit (INDEPENDENT_AMBULATORY_CARE_PROVIDER_SITE_OTHER): Payer: Medicare Other | Admitting: Orthopedic Surgery

## 2016-04-28 ENCOUNTER — Other Ambulatory Visit: Payer: Self-pay | Admitting: *Deleted

## 2016-04-28 ENCOUNTER — Telehealth: Payer: Self-pay | Admitting: Oncology

## 2016-04-28 ENCOUNTER — Encounter (INDEPENDENT_AMBULATORY_CARE_PROVIDER_SITE_OTHER): Payer: Self-pay | Admitting: Orthopedic Surgery

## 2016-04-28 DIAGNOSIS — I82403 Acute embolism and thrombosis of unspecified deep veins of lower extremity, bilateral: Secondary | ICD-10-CM

## 2016-04-28 DIAGNOSIS — M25461 Effusion, right knee: Secondary | ICD-10-CM

## 2016-04-28 DIAGNOSIS — Z96651 Presence of right artificial knee joint: Secondary | ICD-10-CM | POA: Insufficient documentation

## 2016-04-28 MED ORDER — LIDOCAINE HCL 1 % IJ SOLN
5.0000 mL | INTRAMUSCULAR | Status: AC | PRN
  Administered 2016-04-28: 5 mL

## 2016-04-28 NOTE — Telephone Encounter (Signed)
lvm to inform pt of lab only appt 2/12 at 1 pm per LOS

## 2016-04-28 NOTE — Telephone Encounter (Signed)
Call received from patient requesting lab appt.for Monday.  Message sent to schedulers.

## 2016-04-28 NOTE — Addendum Note (Signed)
Addended byBrand Males on: 04/28/2016 12:41 PM   Modules accepted: Orders

## 2016-04-28 NOTE — Progress Notes (Addendum)
Office Visit Note   Patient: [name]           Date of Birth: [birth year]           MRN: [id number] Visit Date: 04/28/2016 Requested by: [name], MD [address] [place], [place] [address] PCP: Philis Fendt, MD  Subjective: Chief Complaint  Patient presents with  . Right Knee - Follow-up, Edema    HPI [name] is a 61 year old patient with right knee pain.  She had total knee replacement done some years ago.  Has had to 3 months of pain.  Denies any fevers and chills.  Had left total knee replacement done about the same time and that one is asymptomatic.  She is noted on last clinic visit to have an effusion in her knee.  Laboratory values were obtained which demonstrated elevated sedimentation rate of 61 and elevated C-reactive protein of 33.  White count was normal.              Review of Systems All systems reviewed are negative as they relate to the chief complaint within the history of present illness.  Patient denies  fevers or chills.    Assessment & Plan: Visit Diagnoses:  1. Presence of right artificial knee joint   2. Effusion, right knee     Plan: Impression is right knee bloody effusion which does not appear to be infected.  She has not checked her INR lately even a she is on Coumadin.  I encouraged her to do that with her primary care provider.  Aspirated the knee today under this most sterile conditions.  We'll send that off for Gram stain and cell count aerobic and anaerobic culture as well as crystals.  Anything grows out will call her and we will proceed from there.  Follow-Up Instructions: No Follow-up on file.   Orders:  No orders of the defined types were placed in this encounter.  No orders of the defined types were placed in this encounter.     Procedures: Large Joint Inj Date/Time: 04/28/2016 3:11 PM Performed by: Meredith Pel Authorized by: Meredith Pel   Consent Given by:  Patient Site marked: the procedure site was  marked   Timeout: prior to procedure the correct patient, procedure, and site was verified   Indications:  Pain, joint swelling and diagnostic evaluation Location:  Knee Prep: patient was prepped and draped in usual sterile fashion   Needle Size:  18 G Needle Length:  1.5 inches Approach:  Superolateral Ultrasound Guidance: No   Fluoroscopic Guidance: No   Arthrogram: No   Medications:  5 mL lidocaine 1 % Aspiration Attempted: Yes   Aspirate amount (mL):  40 Aspirate:  Bloody Patient tolerance:  Patient tolerated the procedure well with no immediate complications     Clinical Data: No additional findings.  Objective: Vital Signs: There were no vitals taken for this visit.  Physical Exam   Constitutional: Patient appears well-developed HEENT:  Head: Normocephalic Eyes:EOM are normal Neck: Normal range of motion Cardiovascular: Normal rate Pulmonary/chest: Effort normal Neurologic: Patient is alert Skin: Skin is warm Psychiatric: Patient has normal mood and affect    Ortho Exam orthoses and demonstrates good range of motion with mild effusion in the right knee.  She has intact extensor mechanism no proximal adenopathy stable collateral ligaments palpable pedal pulses no warmth right knee versus left today but it was slightly warmer last clinic visit.  Specialty Comments:  No specialty comments available.  Imaging: No results found.   PMFS History: Patient Active Problem List   Diagnosis Date Noted  . Effusion, right knee 04/28/2016  . Presence of right artificial knee joint 04/28/2016  . Abnormal LFTs   . Decreased sensation   . Paresthesia 06/12/2014  . Obstruction of kidney 04/24/2014  . Diabetes type 2, uncontrolled ([place]) 04/24/2014  . Chronic anticoagulation 04/24/2014  . Crack cocaine use 04/24/2014  . Depression 04/24/2014  . Fibromyalgia 04/24/2014  . COPD (chronic obstructive pulmonary disease) ([place]) 04/24/2014  . Obstructive sleep apnea on CPAP  04/24/2014  . Lupus anticoagulant positive 04/24/2014  . Adrenal mass, right ([place]) 04/24/2014  . Right kidney mass 04/24/2014  . Hydronephrosis of right kidney 04/24/2014  . Supratherapeutic INR 04/24/2014  . Renal mass, right 04/24/2014  . Diabetes type 2, controlled ([place])   . Right lower quadrant abdominal pain   . Morbid obesity ([place]) 08/21/2013  . Unspecified constipation 08/19/2013  . COPD exacerbation ([place]) 08/17/2013  . Morbid obesity with BMI of 45.0-49.9, adult ([place]) 01/05/2012  . Bilateral deep vein thromboses ([place]) 12/13/2011  . Abdominal pain 11/05/2011  . Hypokalemia 11/05/2011  . Sciatica 11/05/2011  . Type II or unspecified type diabetes mellitus without mention of complication, not stated as uncontrolled 11/05/2011  . HTN (hypertension) 11/05/2011   Past Medical History:  Diagnosis Date  . Adrenal mass ([place]) 03/226   Benign  . Arthritis    knees/multiple orthopedic conditons; lower back  . Asthma    per pt  . 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 Depression   . Diabetes mellitus    120s usually fasting  . Dizziness, nonspecific   . DVT (deep venous thrombosis) (HCC)    Recurrent  . Fibromyalgia   . GERD (gastroesophageal reflux disease)   . Heart murmur   . History of blood transfusion   . History of cocaine abuse    Remote history   . Hypertension    takes meds daily  . Lupus Anticoagulant Positive   . On home oxygen therapy    at night  . Shortness of breath    exertion or lying flat  . Sleep apnea    2l of oxygen at night    Family History  Problem Relation Age of Onset  . Hypertension Father   . Heart failure Father   . Hypertension Mother   . Diabetes Mother   . Cancer Mother     Pancreatic    Past Surgical History:  Procedure Laterality Date  . ABDOMINAL HYSTERECTOMY  1989   Fibroids  . ANTERIOR LUMBAR FUSION   01/03/2012   Procedure: ANTERIOR LUMBAR FUSION 1 LEVEL;  Surgeon: Winfield Cunas, MD;  Location: [facility];  Service: Neurosurgery;  Laterality: N/A;  Lumbar Four-Five Anterior Lumbar Interbody Fusion with Instrumentation  . BACK SURGERY     cervical spine---disk disease  . CARPAL TUNNEL RELEASE     Bilateral  . CESAREAN SECTION    . CYSTOSCOPY/RETROGRADE/URETEROSCOPY/STONE EXTRACTION WITH BASKET Right 04/26/2014   Procedure: CYSTOSCOPY/ RIGHT RETROGRADE/ RIGHT URETEROSCOPY/URETERAL AND RENAL PELVIS BIOPSY;  Surgeon: Alexis Frock, MD;  Location: [facility];  Service: Urology;  Laterality: Right;  . gallstones removed    . 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   bilateral  . right elbow surgery    . TUBAL LIGATION     Social History   Occupational History  .  Not on file.   Social History Main Topics  . Smoking status: Former Smoker    Start date: 03/20/2001    Quit date: 03/21/2003  . Smokeless tobacco: Never Used     Comment: smoked for 2 years  . Alcohol use No     Comment: beer in past   . Drug use: Yes    Types: Cocaine     Comment: quit 2003-rehab progam in 2005  . Sexual activity: No

## 2016-04-29 LAB — SYNOVIAL CELL COUNT + DIFF, W/ CRYSTALS
Basophils, %: 0 %
Eosinophils-Synovial: 0 % (ref 0–2)
Lymphocytes-Synovial Fld: 38 % (ref 0–74)
Monocyte/Macrophage: 7 % (ref 0–69)
Neutrophil, Synovial: 55 % — ABNORMAL HIGH (ref 0–24)
Synoviocytes, %: 0 % (ref 0–15)
WBC, Synovial: 2830 cells/uL — ABNORMAL HIGH (ref <150)

## 2016-05-01 ENCOUNTER — Telehealth: Payer: Self-pay | Admitting: *Deleted

## 2016-05-01 ENCOUNTER — Other Ambulatory Visit: Payer: Medicare Other

## 2016-05-01 ENCOUNTER — Other Ambulatory Visit (HOSPITAL_BASED_OUTPATIENT_CLINIC_OR_DEPARTMENT_OTHER): Payer: Medicare Other

## 2016-05-01 DIAGNOSIS — I82403 Acute embolism and thrombosis of unspecified deep veins of lower extremity, bilateral: Secondary | ICD-10-CM | POA: Diagnosis present

## 2016-05-01 LAB — PROTIME-INR
INR: 2.1 (ref 2.00–3.50)
Protime: 25.2 Seconds — ABNORMAL HIGH (ref 10.6–13.4)

## 2016-05-01 MED ORDER — WARFARIN SODIUM 5 MG PO TABS: 5.0000 mg | ORAL_TABLET | Freq: Every day | ORAL | 0 refills | Status: DC

## 2016-05-01 MED ORDER — WARFARIN SODIUM 10 MG PO TABS: ORAL_TABLET | ORAL | 2 refills | Status: DC

## 2016-05-01 NOTE — Telephone Encounter (Signed)
-----   Message from Ladell Pier, MD sent at 05/01/2016  1:35 PM EST ----- Please call patient, same coumadin, check PT 1 month

## 2016-05-01 NOTE — Telephone Encounter (Signed)
Left message with pt to call [place] back regarding Coumadin dose.

## 2016-05-01 NOTE — Telephone Encounter (Signed)
Call placed to patient to confirm Coumadin dose.  Patient states that she is taking 10 mg on Monday and Wednesday and 5 mg all other days.  Patient is requesting appt with Dr. Benay Spice d/t blood that was drained off of her knee on 04/28/16 by Dr. Randel Pigg that she believes is cancer related.  Informed patient that I would discuss her concerns with Dr. Benay Spice and call her back.

## 2016-05-02 ENCOUNTER — Telehealth: Payer: Self-pay | Admitting: *Deleted

## 2016-05-02 NOTE — Telephone Encounter (Signed)
Patient informed per Dr. Benay Spice to continue Coumadin 10 mg on Monday and Wednesday and 5 mg all other days.  Teach back done.  Patient appreciative of call and has no questions at this time.

## 2016-05-03 LAB — BODY FLUID CULTURE
Gram Stain: NONE SEEN
Organism ID, Bacteria: NO GROWTH

## 2016-05-18 ENCOUNTER — Ambulatory Visit (INDEPENDENT_AMBULATORY_CARE_PROVIDER_SITE_OTHER): Payer: Medicare Other | Admitting: Orthopedic Surgery

## 2016-05-22 ENCOUNTER — Telehealth: Payer: Self-pay | Admitting: *Deleted

## 2016-05-22 NOTE — Telephone Encounter (Signed)
Call received from patient to inform Dr. Benay Spice that she is having increased pain to her right knee and will be f/u with Dr. Marlou Sa this Wednesday.  Dr. Benay Spice informed.

## 2016-05-24 ENCOUNTER — Telehealth (INDEPENDENT_AMBULATORY_CARE_PROVIDER_SITE_OTHER): Payer: Self-pay | Admitting: *Deleted

## 2016-05-24 ENCOUNTER — Encounter (INDEPENDENT_AMBULATORY_CARE_PROVIDER_SITE_OTHER): Payer: Self-pay | Admitting: Orthopedic Surgery

## 2016-05-24 ENCOUNTER — Ambulatory Visit (INDEPENDENT_AMBULATORY_CARE_PROVIDER_SITE_OTHER): Payer: Medicare Other | Admitting: Orthopedic Surgery

## 2016-05-24 DIAGNOSIS — M545 Low back pain: Secondary | ICD-10-CM | POA: Diagnosis not present

## 2016-05-24 DIAGNOSIS — Z96651 Presence of right artificial knee joint: Secondary | ICD-10-CM | POA: Diagnosis not present

## 2016-05-24 DIAGNOSIS — G8929 Other chronic pain: Secondary | ICD-10-CM

## 2016-05-24 DIAGNOSIS — M25551 Pain in right hip: Secondary | ICD-10-CM | POA: Insufficient documentation

## 2016-05-24 DIAGNOSIS — M25561 Pain in right knee: Secondary | ICD-10-CM

## 2016-05-24 DIAGNOSIS — M79604 Pain in right leg: Secondary | ICD-10-CM | POA: Insufficient documentation

## 2016-05-24 MED ORDER — OXYCODONE-ACETAMINOPHEN 5-325 MG PO TABS: 1.0000 | ORAL_TABLET | Freq: Two times a day (BID) | ORAL | 0 refills | Status: DC | PRN

## 2016-05-24 NOTE — Progress Notes (Signed)
Office Visit Note   Patient: [name]           Date of Birth: 04-Oct-1955           MRN: [id number] Visit Date: 05/24/2016 Requested by: Colleen Ebbs, MD [address] [place], [place] [address] PCP: Colleen Fendt, MD  Subjective: Chief Complaint  Patient presents with  . Right Knee - Pain    HPI: [name] is a 61 year old female with right knee pain.  Had the knee aspirated to 1918 which helped.  Blood was aspirated at that time.  INR at the time was 2.1.  Cultures came back negative.  White count in the fluid was only 2800.  She reports that the pain recurred after the aspiration.  Its level X out of 10 now with constant shooting pain.  She has difficulty sleeping.  Oxycodone helps some.  MRI scan from 2015 shows postsurgical changes at L4-5.              ROS: All systems reviewed are negative as they relate to the chief complaint within the history of present illness.  Patient denies  fevers or chills.   Assessment & Plan: Visit Diagnoses:  1. Chronic pain of right knee   2. Presence of right artificial knee joint     Plan: Impression is right knee pain with possible back mediated source.  The knee itself looks pretty reasonable.  INR not elevated.  No effusion today.  I will send her for ultrasound to make sure she hasn't developed more blood clots.  She also needs an MRI scan of her back because she is having some radicular symptoms.  The constant aching gnawing pain with no warmth to the knee and no effusion makes me think that it could be coming from another source.  We'll see her back after that study.  Under right or a one time prescription for pain medicine today.  We will also get ultrasound today to rule out DVT in the right leg  Follow-Up Instructions: No Follow-up on file.   Orders:  No orders of the defined types were placed in this encounter.  No orders of the defined types were placed in this encounter.     Procedures: No procedures performed   Clinical  Data: No additional findings.  Objective: Vital Signs: There were no vitals taken for this visit.  Physical Exam:   Constitutional: Patient appears well-developed HEENT:  Head: Normocephalic Eyes:EOM are normal Neck: Normal range of motion Cardiovascular: Normal rate Pulmonary/chest: Effort normal Neurologic: Patient is alert Skin: Skin is warm Psychiatric: Patient has normal mood and affect    Ortho Exam: Orthopedic exam demonstrates excellent right knee range of motion with no effusion or warmth.  Collateral crucial ligament are stable.  Nerve root tension signs equivocal on the right negative on the left.  No paresthesias today.  Pedal pulses palpable.  Not much in way of groin pain with internal/external rotation of the leg but she does have some pain when I flex her hip some.  She is using a walker.  Specialty Comments:  No specialty comments available.  Imaging: No results found.   PMFS History: Patient Active Problem List   Diagnosis Date Noted  . Chronic pain of right knee 05/24/2016  . Effusion, right knee 04/28/2016  . Presence of right artificial knee joint 04/28/2016  . Abnormal LFTs   . Decreased sensation   . Paresthesia 06/12/2014  . Obstruction of kidney 04/24/2014  . Diabetes  type 2, uncontrolled ([place]) 04/24/2014  . Chronic anticoagulation 04/24/2014  . Crack cocaine use 04/24/2014  . Depression 04/24/2014  . Fibromyalgia 04/24/2014  . COPD (chronic obstructive pulmonary disease) ([place]) 04/24/2014  . Obstructive sleep apnea on CPAP 04/24/2014  . Lupus anticoagulant positive 04/24/2014  . Adrenal mass, right ([place]) 04/24/2014  . Right kidney mass 04/24/2014  . Hydronephrosis of right kidney 04/24/2014  . Supratherapeutic INR 04/24/2014  . Renal mass, right 04/24/2014  . Diabetes type 2, controlled ([place])   . Right lower quadrant abdominal pain   . Morbid obesity ([place]) 08/21/2013  . Unspecified constipation 08/19/2013  . COPD exacerbation ([place])  08/17/2013  . Morbid obesity with BMI of 45.0-49.9, adult ([place]) 01/05/2012  . Bilateral deep vein thromboses ([place]) 12/13/2011  . Abdominal pain 11/05/2011  . Hypokalemia 11/05/2011  . Sciatica 11/05/2011  . Type II or unspecified type diabetes mellitus without mention of complication, not stated as uncontrolled 11/05/2011  . HTN (hypertension) 11/05/2011   Past Medical History:  Diagnosis Date  . Adrenal mass ([place]) 03/226   Benign  . Arthritis    knees/multiple orthopedic conditons; lower back  . Asthma    per pt  . 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 Depression   . Diabetes mellitus    120s usually fasting  . Dizziness, nonspecific   . DVT (deep venous thrombosis) (HCC)    Recurrent  . Fibromyalgia   . GERD (gastroesophageal reflux disease)   . Heart murmur   . History of blood transfusion   . History of cocaine abuse    Remote history   . Hypertension    takes meds daily  . Lupus Anticoagulant Positive   . On home oxygen therapy    at night  . Shortness of breath    exertion or lying flat  . Sleep apnea    2l of oxygen at night    Family History  Problem Relation Age of Onset  . Hypertension Father   . Heart failure Father   . Hypertension Mother   . Diabetes Mother   . Cancer Mother     Pancreatic    Past Surgical History:  Procedure Laterality Date  . ABDOMINAL HYSTERECTOMY  1989   Fibroids  . ANTERIOR LUMBAR FUSION  01/03/2012   Procedure: ANTERIOR LUMBAR FUSION 1 LEVEL;  Surgeon: Winfield Cunas, MD;  Location: [facility];  Service: Neurosurgery;  Laterality: N/A;  Lumbar Four-Five Anterior Lumbar Interbody Fusion with Instrumentation  . BACK SURGERY     cervical spine---disk disease  . CARPAL TUNNEL RELEASE     Bilateral  . CESAREAN SECTION    . CYSTOSCOPY/RETROGRADE/URETEROSCOPY/STONE EXTRACTION WITH BASKET Right 04/26/2014    Procedure: CYSTOSCOPY/ RIGHT RETROGRADE/ RIGHT URETEROSCOPY/URETERAL AND RENAL PELVIS BIOPSY;  Surgeon: Alexis Frock, MD;  Location: [facility];  Service: Urology;  Laterality: Right;  . gallstones removed    . 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   bilateral  . right elbow surgery    . TUBAL LIGATION     Social History   Occupational History  . Not on file.   Social History Main Topics  . Smoking status: Former Smoker    Start date: 03/20/2001    Quit date: 03/21/2003  . Smokeless tobacco: Never Used     Comment: smoked for 2 years  . Alcohol use No     Comment: beer  in past   . Drug use: Yes    Types: Cocaine     Comment: quit 2003-rehab progam in 2005  . Sexual activity: No

## 2016-05-24 NOTE — Telephone Encounter (Signed)
Pt has appt scheduled at [facility] lab on 05/25/16 at 4:00pm, pt is aware of appt

## 2016-05-24 NOTE — Addendum Note (Signed)
Addended byBrand Males on: 05/24/2016 09:22 AM   Modules accepted: Orders

## 2016-05-25 ENCOUNTER — Ambulatory Visit (HOSPITAL_COMMUNITY)
Admission: RE | Admit: 2016-05-25 | Discharge: 2016-05-25 | Disposition: A | Discharge status: Home / Self Care | Payer: Medicare Other | Source: Ambulatory Visit | Attending: Orthopedic Surgery | Admitting: Orthopedic Surgery

## 2016-05-25 DIAGNOSIS — G8929 Other chronic pain: Secondary | ICD-10-CM | POA: Diagnosis not present

## 2016-05-25 DIAGNOSIS — M25561 Pain in right knee: Secondary | ICD-10-CM | POA: Insufficient documentation

## 2016-05-25 DIAGNOSIS — Z96651 Presence of right artificial knee joint: Secondary | ICD-10-CM | POA: Diagnosis not present

## 2016-05-25 NOTE — Progress Notes (Signed)
Preliminary results by tech - Right Lower Ext. Venous Duplex Completed. Negative for deep and superficial vein thrombosis and Baker's Cyst. Oda Cogan, BS, RDMS, RVT

## 2016-06-03 ENCOUNTER — Ambulatory Visit
Admission: RE | Admit: 2016-06-03 | Discharge: 2016-06-03 | Disposition: A | Discharge status: Home / Self Care | Payer: Medicare Other | Source: Ambulatory Visit | Attending: Orthopedic Surgery | Admitting: Orthopedic Surgery

## 2016-06-03 DIAGNOSIS — M545 Low back pain: Principal | ICD-10-CM

## 2016-06-03 DIAGNOSIS — G8929 Other chronic pain: Secondary | ICD-10-CM

## 2016-06-03 MED ORDER — GADOBENATE DIMEGLUMINE 529 MG/ML IV SOLN
20.0000 mL | Freq: Once | INTRAVENOUS | Status: AC | PRN
  Administered 2016-06-03: 20 mL via INTRAVENOUS

## 2016-06-14 ENCOUNTER — Encounter (INDEPENDENT_AMBULATORY_CARE_PROVIDER_SITE_OTHER): Payer: Self-pay | Admitting: Orthopedic Surgery

## 2016-06-14 ENCOUNTER — Ambulatory Visit (INDEPENDENT_AMBULATORY_CARE_PROVIDER_SITE_OTHER): Payer: Medicare Other | Admitting: Orthopedic Surgery

## 2016-06-14 DIAGNOSIS — M25461 Effusion, right knee: Secondary | ICD-10-CM

## 2016-06-14 DIAGNOSIS — G8929 Other chronic pain: Secondary | ICD-10-CM | POA: Diagnosis not present

## 2016-06-14 DIAGNOSIS — M48061 Spinal stenosis, lumbar region without neurogenic claudication: Secondary | ICD-10-CM | POA: Diagnosis not present

## 2016-06-14 DIAGNOSIS — M25561 Pain in right knee: Secondary | ICD-10-CM

## 2016-06-14 NOTE — Progress Notes (Signed)
Office Visit Note   Patient: [name]           Date of Birth: 05-22-1955           MRN: [id number] Visit Date: 06/14/2016 Requested by: [name], MD [address] [place], [place] [address] PCP: Philis Fendt, MD  Subjective: Chief Complaint  Patient presents with  . Right Knee - Follow-up  . Lower Back - Follow-up    HPI: Ladiamond is a 61 year old female with right knee pain and low back pain.  She had an aspiration done to 1918 which actually has helped.  Her right knee hurts a little now has a little bit of aching pain but in general she's doing well.  No further issues with the right knee at this time.  No fevers and chills.  Patient also here to review her lumbar spine MRI.  At scan shows moderate L3-4 stenosis but no right-sided lateralizing nerve compression.  Fusion at L4-5 is intact.  She is allergic to steroids and she is on Coumadin.              ROS: All systems reviewed are negative as they relate to the chief complaint within the history of present illness.  Patient denies  fevers or chills.   Assessment & Plan: Visit Diagnoses:  1. Effusion, right knee   2. Chronic pain of right knee   3. Spinal stenosis of lumbar region, unspecified whether neurogenic claudication present     Plan: Impression is improvement in right knee symptoms with no current effusion.  That is something we can watch for now.  In regards to her back to think that's something that also is not an operative problem but she is having a little bit of progressive L3-4 stenosis.  I would favor observation for this as well particularly with her steroid allergy and Coumadin use.  I'll see her back as needed  Follow-Up Instructions: Return if symptoms worsen or fail to improve.   Orders:  No orders of the defined types were placed in this encounter.  No orders of the defined types were placed in this encounter.     Procedures: No procedures performed   Clinical Data: No additional  findings.  Objective: Vital Signs: There were no vitals taken for this visit.  Physical Exam:   Constitutional: Patient appears well-developed HEENT:  Head: Normocephalic Eyes:EOM are normal Neck: Normal range of motion Cardiovascular: Normal rate Pulmonary/chest: Effort normal Neurologic: Patient is alert Skin: Skin is warm Psychiatric: Patient has normal mood and affect    Ortho Exam: Orthopedic exam demonstrates good cervical spine range of motion she has good knee range of motion on the right with no effusion.  Collaterals are stable.  No warmth to the knee.  No nerve retention signs on either side with good ankle dorsiflexion plantar flexion quite hamstring strength.  Not much pain with forward lateral bending.  Specialty Comments:  No specialty comments available.  Imaging: No results found.   PMFS History: Patient Active Problem List   Diagnosis Date Noted  . Chronic pain of right knee 05/24/2016  . Effusion, right knee 04/28/2016  . Presence of right artificial knee joint 04/28/2016  . Abnormal LFTs   . Decreased sensation   . Paresthesia 06/12/2014  . Obstruction of kidney 04/24/2014  . Diabetes type 2, uncontrolled ([place]) 04/24/2014  . Chronic anticoagulation 04/24/2014  . Crack cocaine use 04/24/2014  . Depression 04/24/2014  . Fibromyalgia 04/24/2014  . COPD (chronic  obstructive pulmonary disease) ([place]) 04/24/2014  . Obstructive sleep apnea on CPAP 04/24/2014  . Lupus anticoagulant positive 04/24/2014  . Adrenal mass, right ([place]) 04/24/2014  . Right kidney mass 04/24/2014  . Hydronephrosis of right kidney 04/24/2014  . Supratherapeutic INR 04/24/2014  . Renal mass, right 04/24/2014  . Diabetes type 2, controlled ([place])   . Right lower quadrant abdominal pain   . Morbid obesity ([place]) 08/21/2013  . Unspecified constipation 08/19/2013  . COPD exacerbation ([place]) 08/17/2013  . Morbid obesity with BMI of 45.0-49.9, adult ([place]) 01/05/2012  . Bilateral deep  vein thromboses ([place]) 12/13/2011  . Abdominal pain 11/05/2011  . Hypokalemia 11/05/2011  . Sciatica 11/05/2011  . Type II or unspecified type diabetes mellitus without mention of complication, not stated as uncontrolled 11/05/2011  . HTN (hypertension) 11/05/2011   Past Medical History:  Diagnosis Date  . Adrenal mass ([place]) 03/226   Benign  . Arthritis    knees/multiple orthopedic conditons; lower back  . Asthma    per pt  . 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 Depression   . Diabetes mellitus    120s usually fasting  . Dizziness, nonspecific   . DVT (deep venous thrombosis) (HCC)    Recurrent  . Fibromyalgia   . GERD (gastroesophageal reflux disease)   . Heart murmur   . History of blood transfusion   . History of cocaine abuse    Remote history   . Hypertension    takes meds daily  . Lupus Anticoagulant Positive   . On home oxygen therapy    at night  . Shortness of breath    exertion or lying flat  . Sleep apnea    2l of oxygen at night    Family History  Problem Relation Age of Onset  . Hypertension Father   . Heart failure Father   . Hypertension Mother   . Diabetes Mother   . Cancer Mother     Pancreatic    Past Surgical History:  Procedure Laterality Date  . ABDOMINAL HYSTERECTOMY  1989   Fibroids  . ANTERIOR LUMBAR FUSION  01/03/2012   Procedure: ANTERIOR LUMBAR FUSION 1 LEVEL;  Surgeon: Winfield Cunas, MD;  Location: [facility];  Service: Neurosurgery;  Laterality: N/A;  Lumbar Four-Five Anterior Lumbar Interbody Fusion with Instrumentation  . BACK SURGERY     cervical spine---disk disease  . CARPAL TUNNEL RELEASE     Bilateral  . CESAREAN SECTION    . CYSTOSCOPY/RETROGRADE/URETEROSCOPY/STONE EXTRACTION WITH BASKET Right 04/26/2014   Procedure: CYSTOSCOPY/ RIGHT RETROGRADE/ RIGHT URETEROSCOPY/URETERAL AND RENAL PELVIS BIOPSY;   Surgeon: Alexis Frock, MD;  Location: [facility];  Service: Urology;  Laterality: Right;  . gallstones removed    . 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   bilateral  . right elbow surgery    . TUBAL LIGATION     Social History   Occupational History  . Not on file.   Social History Main Topics  . Smoking status: Former Smoker    Start date: 03/20/2001    Quit date: 03/21/2003  . Smokeless tobacco: Never Used     Comment: smoked for 2 years  . Alcohol use No     Comment: beer in past   . Drug use: Yes    Types: Cocaine     Comment: quit 2003-rehab progam in 2005  . Sexual activity: No

## 2016-06-26 ENCOUNTER — Telehealth (INDEPENDENT_AMBULATORY_CARE_PROVIDER_SITE_OTHER): Payer: Self-pay | Admitting: Orthopedic Surgery

## 2016-06-26 NOTE — Telephone Encounter (Signed)
Tried to call patient to discuss. No answer. LM for her advising was returning call. She needs to make an appt to be seen.

## 2016-06-26 NOTE — Telephone Encounter (Signed)
Patients said her right knee keeps going out on her causing her to fall twice this morning. she wants to speak with someone to see what she can do (rest, ice, heat, or elevation) until she can find a ride, if she is able to come in. She says her knee is warm and tight. cb#: [phone]  * I told her while she is waiting to speak with someone to call around and see if she can get a ride to possibly come in and see Dr.Dean this afternoon if you / Dr.Dean say it is ok, she will call me back for this.  - let me know if I can do anything further, thanks.

## 2016-06-28 ENCOUNTER — Ambulatory Visit (INDEPENDENT_AMBULATORY_CARE_PROVIDER_SITE_OTHER): Payer: Medicare Other | Admitting: Orthopedic Surgery

## 2016-06-28 ENCOUNTER — Ambulatory Visit (INDEPENDENT_AMBULATORY_CARE_PROVIDER_SITE_OTHER): Payer: Medicare Other

## 2016-06-28 ENCOUNTER — Encounter (INDEPENDENT_AMBULATORY_CARE_PROVIDER_SITE_OTHER): Payer: Self-pay | Admitting: Orthopedic Surgery

## 2016-06-28 DIAGNOSIS — M25561 Pain in right knee: Secondary | ICD-10-CM | POA: Diagnosis not present

## 2016-06-28 NOTE — Progress Notes (Signed)
Office Visit Note   Patient: [name]           Date of Birth: [birth year]           MRN: [id number] Visit Date: 06/28/2016 Requested by: [name], MD [address] [place], [place] [address] PCP: Philis Fendt, MD  Subjective: Chief Complaint  Patient presents with  . Right Knee - Edema, Pain, Weakness    HPI: [name] is a 61 year old patient with right knee weakness.  Started Sunday, 06/25/2016.  She had a fall yesterday.  She reports stiffness in the knee.  Reports pain with pressure.  She denies any back pain or hip pain.  She is using a walker.  She takes oxycodone and is not requesting any more that today.  She denies any fevers and chills.  She thinks tramadol may help.  Radiographs unremarkable.              ROS: All systems reviewed are negative as they relate to the chief complaint within the history of present illness.  Patient denies  fevers or chills.   Assessment & Plan: Visit Diagnoses:  1. Acute pain of right knee     Plan: Impression is right knee pain no evidence of loosening or infection.  There is no effusion in the knee today.  Range of motion is excellent.  I think it's worth trying tramadol but there is no other intervention required at this time.  I'll see her back as needed  Follow-Up Instructions: Return if symptoms worsen or fail to improve.   Orders:  Orders Placed This Encounter  Procedures  . XR KNEE 3 VIEW RIGHT   No orders of the defined types were placed in this encounter.     Procedures: No procedures performed   Clinical Data: No additional findings.  Objective: Vital Signs: There were no vitals taken for this visit.  Physical Exam:   Constitutional: Patient appears well-developed HEENT:  Head: Normocephalic Eyes:EOM are normal Neck: Normal range of motion Cardiovascular: Normal rate Pulmonary/chest: Effort normal Neurologic: Patient is alert Skin: Skin is warm Psychiatric: Patient has normal mood and  affect    Ortho Exam: Orthopedic exam demonstrates pretty normal gait alignment.  She is using a walker.  Pedal pulses palpable.  There is no effusion or warmth in the right knee.  Patella moves well.  There is no apprehension and no asymmetric lateral or medial instability to the patella.  No other masses lymph adenopathy or skin changes noted in the right knee region.  Specialty Comments:  No specialty comments available.  Imaging: Xr Knee 3 View Right  Result Date: 06/28/2016 AP lateral merchant view right knee reviewed.  No fracture.  No evidence of loosening.  No complicating features.  Small osteophyte off the inferior pole of the patella noted.    PMFS History: Patient Active Problem List   Diagnosis Date Noted  . Chronic pain of right knee 05/24/2016  . Effusion, right knee 04/28/2016  . Presence of right artificial knee joint 04/28/2016  . Abnormal LFTs   . Decreased sensation   . Paresthesia 06/12/2014  . Obstruction of kidney 04/24/2014  . Diabetes type 2, uncontrolled ([place]) 04/24/2014  . Chronic anticoagulation 04/24/2014  . Crack cocaine use 04/24/2014  . Depression 04/24/2014  . Fibromyalgia 04/24/2014  . COPD (chronic obstructive pulmonary disease) ([place]) 04/24/2014  . Obstructive sleep apnea on CPAP 04/24/2014  . Lupus anticoagulant positive 04/24/2014  . Adrenal mass, right ([place]) 04/24/2014  .  Right kidney mass 04/24/2014  . Hydronephrosis of right kidney 04/24/2014  . Supratherapeutic INR 04/24/2014  . Renal mass, right 04/24/2014  . Diabetes type 2, controlled ([place])   . Right lower quadrant abdominal pain   . Morbid obesity ([place]) 08/21/2013  . Unspecified constipation 08/19/2013  . COPD exacerbation ([place]) 08/17/2013  . Morbid obesity with BMI of 45.0-49.9, adult ([place]) 01/05/2012  . Bilateral deep vein thromboses ([place]) 12/13/2011  . Abdominal pain 11/05/2011  . Hypokalemia 11/05/2011  . Sciatica 11/05/2011  . Type II or unspecified type diabetes  mellitus without mention of complication, not stated as uncontrolled 11/05/2011  . HTN (hypertension) 11/05/2011   Past Medical History:  Diagnosis Date  . Adrenal mass ([place]) 03/226   Benign  . Arthritis    knees/multiple orthopedic conditons; lower back  . Asthma    per pt  . 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 Depression   . Diabetes mellitus    120s usually fasting  . Dizziness, nonspecific   . DVT (deep venous thrombosis) (HCC)    Recurrent  . Fibromyalgia   . GERD (gastroesophageal reflux disease)   . Heart murmur   . History of blood transfusion   . History of cocaine abuse    Remote history   . Hypertension    takes meds daily  . Lupus Anticoagulant Positive   . On home oxygen therapy    at night  . Shortness of breath    exertion or lying flat  . Sleep apnea    2l of oxygen at night    Family History  Problem Relation Age of Onset  . Hypertension Father   . Heart failure Father   . Hypertension Mother   . Diabetes Mother   . Cancer Mother     Pancreatic    Past Surgical History:  Procedure Laterality Date  . ABDOMINAL HYSTERECTOMY  1989   Fibroids  . ANTERIOR LUMBAR FUSION  01/03/2012   Procedure: ANTERIOR LUMBAR FUSION 1 LEVEL;  Surgeon: Winfield Cunas, MD;  Location: [facility];  Service: Neurosurgery;  Laterality: N/A;  Lumbar Four-Five Anterior Lumbar Interbody Fusion with Instrumentation  . BACK SURGERY     cervical spine---disk disease  . CARPAL TUNNEL RELEASE     Bilateral  . CESAREAN SECTION    . CYSTOSCOPY/RETROGRADE/URETEROSCOPY/STONE EXTRACTION WITH BASKET Right 04/26/2014   Procedure: CYSTOSCOPY/ RIGHT RETROGRADE/ RIGHT URETEROSCOPY/URETERAL AND RENAL PELVIS BIOPSY;  Surgeon: Alexis Frock, MD;  Location: [facility];  Service: Urology;  Laterality: Right;  . gallstones removed    . 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    bilateral  . right elbow surgery    . TUBAL LIGATION     Social History   Occupational History  . Not on file.   Social History Main Topics  . Smoking status: Former Smoker    Start date: 03/20/2001    Quit date: 03/21/2003  . Smokeless tobacco: Never Used     Comment: smoked for 2 years  . Alcohol use No     Comment: beer in past   . Drug use: Yes    Types: Cocaine     Comment: quit 2003-rehab progam in 2005  . Sexual activity: No

## 2016-07-03 ENCOUNTER — Telehealth: Payer: Self-pay | Admitting: Oncology

## 2016-07-03 ENCOUNTER — Ambulatory Visit: Payer: Medicare Other | Admitting: Oncology

## 2016-07-03 ENCOUNTER — Other Ambulatory Visit: Payer: Medicare Other

## 2016-07-03 NOTE — Telephone Encounter (Signed)
Patient called to cancel her appointment with Dr. Benay Spice today due to having gout.  She would like to reschedule her lab and Dr appointment in the same day anything before 12p

## 2016-07-03 NOTE — Telephone Encounter (Signed)
Called patient to reschedule and confirm appointment, per 07/03/16 patient call/msg. Patient not available. Left voicemail with appointment details. Appointments rescheduled to next available of 07/24/16 at noon.

## 2016-07-03 NOTE — Telephone Encounter (Signed)
Spoke with patient re lab appointment for tomorrow. Scheduled per 4/16 schedule message.

## 2016-07-04 ENCOUNTER — Other Ambulatory Visit (HOSPITAL_COMMUNITY)
Admission: RE | Admit: 2016-07-04 | Discharge: 2016-07-04 | Disposition: A | Discharge status: Home / Self Care | Payer: Medicare Other | Source: Ambulatory Visit | Attending: Oncology | Admitting: Oncology

## 2016-07-04 ENCOUNTER — Other Ambulatory Visit (HOSPITAL_BASED_OUTPATIENT_CLINIC_OR_DEPARTMENT_OTHER): Payer: Medicare Other

## 2016-07-04 ENCOUNTER — Encounter (HOSPITAL_COMMUNITY): Payer: Medicare Other

## 2016-07-04 DIAGNOSIS — Z7901 Long term (current) use of anticoagulants: Secondary | ICD-10-CM

## 2016-07-04 DIAGNOSIS — I82403 Acute embolism and thrombosis of unspecified deep veins of lower extremity, bilateral: Secondary | ICD-10-CM | POA: Diagnosis present

## 2016-07-04 LAB — PROTIME-INR
INR: 4.92
Prothrombin Time: 47.3 seconds — ABNORMAL HIGH (ref 11.4–15.2)

## 2016-07-04 LAB — CBC WITH DIFFERENTIAL/PLATELET
BASO%: 0.1 % (ref 0.0–2.0)
Basophils Absolute: 0 10*3/uL (ref 0.0–0.1)
EOS%: 1.9 % (ref 0.0–7.0)
Eosinophils Absolute: 0.1 10*3/uL (ref 0.0–0.5)
HCT: 34.1 % — ABNORMAL LOW (ref 34.8–46.6)
HGB: 10.9 g/dL — ABNORMAL LOW (ref 11.6–15.9)
LYMPH%: 25.6 % (ref 14.0–49.7)
MCH: 25.8 pg (ref 25.1–34.0)
MCHC: 32 g/dL (ref 31.5–36.0)
MCV: 80.8 fL (ref 79.5–101.0)
MONO#: 0.4 10*3/uL (ref 0.1–0.9)
MONO%: 5.6 % (ref 0.0–14.0)
NEUT#: 4.6 10*3/uL (ref 1.5–6.5)
NEUT%: 66.8 % (ref 38.4–76.8)
Platelets: 354 10*3/uL (ref 145–400)
RBC: 4.22 10*6/uL (ref 3.70–5.45)
RDW: 17.6 % — ABNORMAL HIGH (ref 11.2–14.5)
WBC: 6.8 10*3/uL (ref 3.9–10.3)
lymph#: 1.8 10*3/uL (ref 0.9–3.3)
nRBC: 0 % (ref 0–0)

## 2016-07-04 NOTE — Progress Notes (Unsigned)
critical results of INR of 4.92 given to [name] at 10.45am. 07/04/16 by Humberto Seals

## 2016-07-05 ENCOUNTER — Telehealth (INDEPENDENT_AMBULATORY_CARE_PROVIDER_SITE_OTHER): Payer: Self-pay | Admitting: *Deleted

## 2016-07-05 NOTE — Telephone Encounter (Signed)
Called advised could pick up rx for knee brace at front desk

## 2016-07-05 NOTE — Telephone Encounter (Signed)
y

## 2016-07-05 NOTE — Telephone Encounter (Signed)
Pt calling asking for a knee brace for her right knee to help take some pressure off. Pt can pick this RX up today if possible, pt has a ride today.

## 2016-07-05 NOTE — Telephone Encounter (Signed)
Ok for rx for knee brace??

## 2016-07-06 ENCOUNTER — Telehealth: Payer: Self-pay | Admitting: *Deleted

## 2016-07-06 ENCOUNTER — Telehealth: Payer: Self-pay | Admitting: Oncology

## 2016-07-06 ENCOUNTER — Other Ambulatory Visit: Payer: Self-pay | Admitting: Nurse Practitioner

## 2016-07-06 DIAGNOSIS — Z7901 Long term (current) use of anticoagulants: Secondary | ICD-10-CM

## 2016-07-06 DIAGNOSIS — D649 Anemia, unspecified: Secondary | ICD-10-CM

## 2016-07-06 NOTE — Telephone Encounter (Signed)
Telephone call to patient- advised patient to hold coumadin today. Pt reports she takes coumadin in the morning. Reeducated patient to take coumadin at bedtime to allow adjustments to be made following lab results. Pt verbalized an understanding.  Pt advised to come to [facility] tomorrow for lab to recheck PTINR. Pt confirms appointment with lab at [facility]. Pt will hold coumadin for rest of today and not take tomorrow until the evening following a call from this office. Pt restated directions appropriately.

## 2016-07-06 NOTE — Telephone Encounter (Signed)
Spoke with patient re lab/fu 4/23 °

## 2016-07-07 ENCOUNTER — Encounter: Payer: Self-pay | Admitting: *Deleted

## 2016-07-07 ENCOUNTER — Other Ambulatory Visit: Payer: Self-pay | Admitting: *Deleted

## 2016-07-07 ENCOUNTER — Other Ambulatory Visit (HOSPITAL_COMMUNITY)
Admission: AD | Admit: 2016-07-07 | Discharge: 2016-07-07 | Disposition: A | Discharge status: Home / Self Care | Payer: Medicare Other | Source: Ambulatory Visit | Attending: Oncology | Admitting: Oncology

## 2016-07-07 ENCOUNTER — Other Ambulatory Visit: Payer: Medicare Other

## 2016-07-07 DIAGNOSIS — Z7901 Long term (current) use of anticoagulants: Secondary | ICD-10-CM | POA: Insufficient documentation

## 2016-07-07 DIAGNOSIS — I82403 Acute embolism and thrombosis of unspecified deep veins of lower extremity, bilateral: Secondary | ICD-10-CM

## 2016-07-07 DIAGNOSIS — I824Y3 Acute embolism and thrombosis of unspecified deep veins of proximal lower extremity, bilateral: Secondary | ICD-10-CM

## 2016-07-07 LAB — PROTIME-INR
INR: 4.21
Prothrombin Time: 41.7 seconds — ABNORMAL HIGH (ref 11.4–15.2)

## 2016-07-07 NOTE — Telephone Encounter (Signed)
This nurse called and left a message regarding PT/INR results and Coumadin. Per Ned Card, NP, hold Coumadin Friday, Saturday, Sunday and Monday. Labs to be rechecked on Monday. Instructed patient to call [facility] if she had any questions or concerns.

## 2016-07-07 NOTE — Progress Notes (Unsigned)
Critical INR reported to [place] by M. Felisha Claytor at [facility] 07/07/16

## 2016-07-07 NOTE — Telephone Encounter (Addendum)
Pt returned call,stated she ran out of Coumadin 5 mg so she has been taking 10 mg daily.  Instructions given to HOLD Coumadin 4/20, 4/21 and 4/22. Come in 4/23 as scheduled for lab and office visit. She voiced understanding.

## 2016-07-08 ENCOUNTER — Emergency Department (HOSPITAL_COMMUNITY): Payer: Medicare Other

## 2016-07-08 ENCOUNTER — Emergency Department (HOSPITAL_COMMUNITY)
Admission: EM | Admit: 2016-07-08 | Discharge: 2016-07-08 | Disposition: A | Discharge status: Home / Self Care | Payer: Medicare Other | Attending: Emergency Medicine | Admitting: Emergency Medicine

## 2016-07-08 ENCOUNTER — Encounter (HOSPITAL_COMMUNITY): Payer: Self-pay | Admitting: Emergency Medicine

## 2016-07-08 DIAGNOSIS — R310 Gross hematuria: Secondary | ICD-10-CM

## 2016-07-08 DIAGNOSIS — Z7984 Long term (current) use of oral hypoglycemic drugs: Secondary | ICD-10-CM | POA: Diagnosis not present

## 2016-07-08 DIAGNOSIS — J449 Chronic obstructive pulmonary disease, unspecified: Secondary | ICD-10-CM | POA: Insufficient documentation

## 2016-07-08 DIAGNOSIS — Z87891 Personal history of nicotine dependence: Secondary | ICD-10-CM | POA: Diagnosis not present

## 2016-07-08 DIAGNOSIS — B379 Candidiasis, unspecified: Secondary | ICD-10-CM | POA: Diagnosis not present

## 2016-07-08 DIAGNOSIS — Z96653 Presence of artificial knee joint, bilateral: Secondary | ICD-10-CM | POA: Insufficient documentation

## 2016-07-08 DIAGNOSIS — E119 Type 2 diabetes mellitus without complications: Secondary | ICD-10-CM | POA: Diagnosis not present

## 2016-07-08 DIAGNOSIS — I10 Essential (primary) hypertension: Secondary | ICD-10-CM | POA: Diagnosis not present

## 2016-07-08 DIAGNOSIS — Z7901 Long term (current) use of anticoagulants: Secondary | ICD-10-CM | POA: Diagnosis not present

## 2016-07-08 DIAGNOSIS — R319 Hematuria, unspecified: Secondary | ICD-10-CM | POA: Diagnosis present

## 2016-07-08 DIAGNOSIS — R109 Unspecified abdominal pain: Secondary | ICD-10-CM

## 2016-07-08 LAB — URINALYSIS, ROUTINE W REFLEX MICROSCOPIC
Bacteria, UA: NONE SEEN
Bilirubin Urine: NEGATIVE
Glucose, UA: >=500 mg/dL — AB
Ketones, ur: NEGATIVE mg/dL
Leukocytes, UA: NEGATIVE
Nitrite: NEGATIVE
Protein, ur: 100 mg/dL — AB
Specific Gravity, Urine: 1.025 (ref 1.005–1.030)
pH: 6 (ref 5.0–8.0)

## 2016-07-08 LAB — CBC WITH DIFFERENTIAL/PLATELET
Basophils Absolute: 0 10*3/uL (ref 0.0–0.1)
Basophils Relative: 0 %
Eosinophils Absolute: 0.2 10*3/uL (ref 0.0–0.7)
Eosinophils Relative: 3 %
HCT: 32.6 % — ABNORMAL LOW (ref 36.0–46.0)
Hemoglobin: 10.3 g/dL — ABNORMAL LOW (ref 12.0–15.0)
Lymphocytes Relative: 35 %
Lymphs Abs: 2.2 10*3/uL (ref 0.7–4.0)
MCH: 25.6 pg — ABNORMAL LOW (ref 26.0–34.0)
MCHC: 31.6 g/dL (ref 30.0–36.0)
MCV: 81.1 fL (ref 78.0–100.0)
Monocytes Absolute: 0.5 10*3/uL (ref 0.1–1.0)
Monocytes Relative: 8 %
Neutro Abs: 3.5 10*3/uL (ref 1.7–7.7)
Neutrophils Relative %: 54 %
Platelets: 360 10*3/uL (ref 150–400)
RBC: 4.02 MIL/uL (ref 3.87–5.11)
RDW: 17.8 % — ABNORMAL HIGH (ref 11.5–15.5)
WBC: 6.4 10*3/uL (ref 4.0–10.5)

## 2016-07-08 LAB — BASIC METABOLIC PANEL
Anion gap: 10 (ref 5–15)
BUN: 8 mg/dL (ref 6–20)
CO2: 27 mmol/L (ref 22–32)
Calcium: 8.8 mg/dL — ABNORMAL LOW (ref 8.9–10.3)
Chloride: 100 mmol/L — ABNORMAL LOW (ref 101–111)
Creatinine, Ser: 0.67 mg/dL (ref 0.44–1.00)
GFR calc Af Amer: >60 mL/min (ref >60)
GFR calc non Af Amer: >60 mL/min (ref >60)
Glucose, Bld: 105 mg/dL — ABNORMAL HIGH (ref 65–99)
Potassium: 3.4 mmol/L — ABNORMAL LOW (ref 3.5–5.1)
Sodium: 137 mmol/L (ref 135–145)

## 2016-07-08 LAB — PROTIME-INR
INR: 4.36
Prothrombin Time: 42.6 seconds — ABNORMAL HIGH (ref 11.4–15.2)

## 2016-07-08 MED ORDER — MORPHINE SULFATE (PF) 4 MG/ML IV SOLN
2.0000 mg | Freq: Once | INTRAVENOUS | Status: AC
  Administered 2016-07-08: 2 mg via INTRAVENOUS
  Filled 2016-07-08: qty 1

## 2016-07-08 MED ORDER — FLUCONAZOLE 100 MG PO TABS
150.0000 mg | ORAL_TABLET | Freq: Once | ORAL | Status: AC
  Administered 2016-07-08: 150 mg via ORAL
  Filled 2016-07-08: qty 2

## 2016-07-08 MED ORDER — SODIUM CHLORIDE 0.9 % IV BOLUS (SEPSIS)
1000.0000 mL | Freq: Once | INTRAVENOUS | Status: AC
  Administered 2016-07-08: 1000 mL via INTRAVENOUS

## 2016-07-08 MED ORDER — ACETAMINOPHEN 500 MG PO TABS
1000.0000 mg | ORAL_TABLET | Freq: Once | ORAL | Status: AC
  Administered 2016-07-08: 1000 mg via ORAL
  Filled 2016-07-08: qty 2

## 2016-07-08 MED ORDER — KETOROLAC TROMETHAMINE 30 MG/ML IJ SOLN
15.0000 mg | Freq: Once | INTRAMUSCULAR | Status: AC
  Administered 2016-07-08: 15 mg via INTRAVENOUS
  Filled 2016-07-08: qty 1

## 2016-07-08 NOTE — ED Provider Notes (Signed)
[facility] DEPT Provider Note   CSN: [id number] Arrival date & time: 07/08/16  2947     History   Chief Complaint Chief Complaint  Patient presents with  . Chest Pain    HPI Raschelle Wisenbaker Halm is a 61 y.o. female.  61 yo F with a cc of hematuria.  Going on for past couple days.  Started having right flank pain as well.  Sharp, nothing makes it better or worse.  Denies injury.  Some pain to the leg.  Chronic pain.  Denies vomiting.  Denies diarrhea.  Denies urinary symptoms.   Patient also is complaining of some left-sided sharp chest pain. Last for seconds at a time. She is unsure what makes it better or worse.   The history is provided by the patient.  Chest Pain   Pertinent negatives include no dizziness, no fever, no headaches, no nausea, no palpitations, no shortness of breath and no vomiting.  Illness  This is a new problem. The current episode started less than 1 hour ago. The problem occurs constantly. The problem has not changed since onset.Associated symptoms include chest pain. Pertinent negatives include no headaches and no shortness of breath. Nothing aggravates the symptoms. Nothing relieves the symptoms. She has tried nothing for the symptoms. The treatment provided no relief.    Past Medical History:  Diagnosis Date  . Adrenal mass ([place]) 03/226   Benign  . Arthritis    knees/multiple orthopedic conditons; lower back  . Asthma    per pt  . 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 Depression   . Diabetes mellitus    120s usually fasting  . Dizziness, nonspecific   . DVT (deep venous thrombosis) (HCC)    Recurrent  . Fibromyalgia   . GERD (gastroesophageal reflux disease)   . Heart murmur   . History of blood transfusion   . History of cocaine abuse    Remote history   . Hypertension    takes meds daily  . Lupus Anticoagulant Positive   .  On home oxygen therapy    at night  . Shortness of breath    exertion or lying flat  . Sleep apnea    2l of oxygen at night    Patient Active Problem List   Diagnosis Date Noted  . Chronic pain of right knee 05/24/2016  . Effusion, right knee 04/28/2016  . Presence of right artificial knee joint 04/28/2016  . Abnormal LFTs   . Decreased sensation   . Paresthesia 06/12/2014  . Obstruction of kidney 04/24/2014  . Diabetes type 2, uncontrolled ([place]) 04/24/2014  . Chronic anticoagulation 04/24/2014  . Crack cocaine use 04/24/2014  . Depression 04/24/2014  . Fibromyalgia 04/24/2014  . COPD (chronic obstructive pulmonary disease) ([place]) 04/24/2014  . Obstructive sleep apnea on CPAP 04/24/2014  . Lupus anticoagulant positive 04/24/2014  . Adrenal mass, right ([place]) 04/24/2014  . Right kidney mass 04/24/2014  . Hydronephrosis of right kidney 04/24/2014  . Supratherapeutic INR 04/24/2014  . Renal mass, right 04/24/2014  . Diabetes type 2, controlled ([place])   . Right lower quadrant abdominal pain   . Morbid obesity ([place]) 08/21/2013  . Unspecified constipation 08/19/2013  . COPD exacerbation ([place]) 08/17/2013  . Morbid obesity with BMI of 45.0-49.9, adult ([place]) 01/05/2012  . Bilateral deep vein thromboses ([place]) 12/13/2011  . Abdominal pain 11/05/2011  . Hypokalemia 11/05/2011  .  Sciatica 11/05/2011  . Type II or unspecified type diabetes mellitus without mention of complication, not stated as uncontrolled 11/05/2011  . HTN (hypertension) 11/05/2011    Past Surgical History:  Procedure Laterality Date  . ABDOMINAL HYSTERECTOMY  1989   Fibroids  . ANTERIOR LUMBAR FUSION  01/03/2012   Procedure: ANTERIOR LUMBAR FUSION 1 LEVEL;  Surgeon: Winfield Cunas, MD;  Location: [facility];  Service: Neurosurgery;  Laterality: N/A;  Lumbar Four-Five Anterior Lumbar Interbody Fusion with Instrumentation  . BACK SURGERY     cervical spine---disk disease  . CARPAL TUNNEL RELEASE     Bilateral    . CESAREAN SECTION    . CYSTOSCOPY/RETROGRADE/URETEROSCOPY/STONE EXTRACTION WITH BASKET Right 04/26/2014   Procedure: CYSTOSCOPY/ RIGHT RETROGRADE/ RIGHT URETEROSCOPY/URETERAL AND RENAL PELVIS BIOPSY;  Surgeon: Alexis Frock, MD;  Location: [facility];  Service: Urology;  Laterality: Right;  . gallstones removed    . 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   bilateral  . right elbow surgery    . TUBAL LIGATION      OB History    No data available       Home Medications    Prior to Admission medications   Medication Sig Start Date End Date Taking? Authorizing Provider  albuterol (PROVENTIL HFA;VENTOLIN HFA) 108 (90 BASE) MCG/ACT inhaler Inhale 2 puffs into the lungs every 4 (four) hours as needed for shortness of breath.    Historical Provider, MD  albuterol (PROVENTIL) (2.5 MG/3ML) 0.083% nebulizer solution Take 2.5 mg by nebulization every 6 (six) hours as needed for wheezing or shortness of breath.    Historical Provider, MD  amLODipine (NORVASC) 5 MG tablet Take 5 mg by mouth daily.    Historical Provider, MD  benzonatate (TESSALON) 100 MG capsule Take 1 capsule by mouth at bedtime as needed. 06/18/15   Historical Provider, MD  canagliflozin (INVOKANA) 100 MG TABS tablet Take 100 mg by mouth daily.    Historical Provider, MD  CARTIA XT 240 MG 24 hr capsule Take 240 mg by mouth daily. 06/17/14   Historical Provider, MD  cloNIDine (CATAPRES) 0.1 MG tablet Take 0.1 mg by mouth 2 (two) times daily.    Historical Provider, MD  cyclobenzaprine (FLEXERIL) 10 MG tablet Take 10 mg by mouth 2 (two) times daily as needed for muscle spasms.  06/05/14   Historical Provider, MD  diclofenac sodium (VOLTAREN) 1 % GEL Apply 2 g topically daily as needed (fibromyalgia pain).    Historical Provider, MD  DULoxetine (CYMBALTA) 20 MG capsule Take 20 mg by mouth at bedtime.    Historical Provider, MD  DULoxetine (CYMBALTA) 60 MG capsule Take 60 mg by mouth daily.    Historical Provider, MD  gabapentin (NEURONTIN) 300  MG capsule Take 300 mg by mouth 3 (three) times daily.    Historical Provider, MD  hydrochlorothiazide (HYDRODIURIL) 12.5 MG tablet Take 12.5 mg by mouth daily.  03/22/15   Historical Provider, MD  loratadine (CLARITIN) 10 MG tablet Take 10 mg by mouth daily as needed for allergies.    Historical Provider, MD  metFORMIN (GLUCOPHAGE) 500 MG tablet Take 500 mg by mouth at bedtime.    Historical Provider, MD  metoCLOPramide (REGLAN) 10 MG tablet Take 1 tablet (10 mg total) by mouth every 6 (six) hours as needed for nausea (or headache). 3/79/02   Delora Fuel, MD  omeprazole (PRILOSEC) 20 MG capsule Take 20 mg by mouth 2 (two) times daily.    Historical Provider, MD  oxyCODONE-acetaminophen (PERCOCET) 5-325  MG tablet Take 1 tablet by mouth every 12 (twelve) hours as needed for moderate pain. 05/24/16   Meredith Pel, MD  polyethylene glycol powder [facility]) powder Take by mouth daily.  06/09/14   Historical Provider, MD  pravastatin (PRAVACHOL) 40 MG tablet Take 40 mg by mouth daily. 06/15/15   Historical Provider, MD  QUEtiapine (SEROQUEL) 50 MG tablet Take 50 mg by mouth 2 (two) times daily.    Historical Provider, MD  traMADol (ULTRAM) 50 MG tablet Take 50 mg by mouth as needed for moderate pain.  06/05/14   Historical Provider, MD  warfarin (COUMADIN) 10 MG tablet 05/01/16-10 mg on Monday and Wednesday and 5 mg all other days 05/01/16   Ladell Pier, MD  warfarin (COUMADIN) 5 MG tablet Take 1 tablet (5 mg total) by mouth daily at 6 PM. Except Monday and Wednesday take 10mg  tablet. 05/01/16   Ladell Pier, MD    Family History Family History  Problem Relation Age of Onset  . Hypertension Mother   . Diabetes Mother   . Cancer Mother     Pancreatic  . Hypertension Father   . Heart failure Father     Social History Social History  Substance Use Topics  . Smoking status: Former Smoker    Start date: 03/20/2001    Quit date: 03/21/2003  . Smokeless tobacco: Never Used     Comment:  smoked for 2 years  . Alcohol use No     Comment: beer in past      Allergies   Lisinopril and Corticosteroids   Review of Systems Review of Systems  Constitutional: Negative for chills and fever.  HENT: Negative for congestion and rhinorrhea.   Eyes: Negative for redness and visual disturbance.  Respiratory: Negative for shortness of breath and wheezing.   Cardiovascular: Positive for chest pain. Negative for palpitations.  Gastrointestinal: Negative for blood in stool, nausea and vomiting.  Genitourinary: Positive for flank pain and hematuria. Negative for dysuria and urgency.  Musculoskeletal: Negative for arthralgias and myalgias.  Skin: Negative for pallor and wound.  Neurological: Negative for dizziness and headaches.     Physical Exam Updated Vital Signs BP (!) 144/85   Pulse 80   Temp 98 F (36.7 C)   Resp 19   SpO2 100%   Physical Exam  Constitutional: She is oriented to person, place, and time. She appears well-developed and well-nourished. No distress.  HENT:  Head: Normocephalic and atraumatic.  Eyes: EOM are normal. Pupils are equal, round, and reactive to light.  Neck: Normal range of motion. Neck supple.  Cardiovascular: Normal rate and regular rhythm.  Exam reveals no gallop and no friction rub.   No murmur heard. Pulmonary/Chest: Effort normal. She has no wheezes. She has no rales.  Abdominal: Soft. She exhibits no distension and no mass. There is no tenderness. There is no guarding.  Musculoskeletal: She exhibits no edema or tenderness.  Patient points just below her right CVA area of tenderness. I'm unable to reproduce on exam. Pulse motor and sensation is intact distally.  Neurological: She is alert and oriented to person, place, and time.  Skin: Skin is warm and dry. She is not diaphoretic.  Psychiatric: She has a normal mood and affect. Her behavior is normal.  Nursing note and vitals reviewed.    ED Treatments / Results  Labs (all labs  ordered are listed, but only abnormal results are displayed) Labs Reviewed  URINALYSIS, ROUTINE W REFLEX MICROSCOPIC - Abnormal;  Notable for the following:       Result Value   Color, Urine RED (*)    APPearance CLOUDY (*)    Glucose, UA >=500 (*)    Hgb urine dipstick LARGE (*)    Protein, ur 100 (*)    Squamous Epithelial / LPF 6-30 (*)    Non Squamous Epithelial 0-5 (*)    All other components within normal limits  CBC WITH DIFFERENTIAL/PLATELET - Abnormal; Notable for the following:    Hemoglobin 10.3 (*)    HCT 32.6 (*)    MCH 25.6 (*)    RDW 17.8 (*)    All other components within normal limits  BASIC METABOLIC PANEL - Abnormal; Notable for the following:    Potassium 3.4 (*)    Chloride 100 (*)    Glucose, Bld 105 (*)    Calcium 8.8 (*)    All other components within normal limits  PROTIME-INR - Abnormal; Notable for the following:    Prothrombin Time 42.6 (*)    INR 4.36 (*)    All other components within normal limits    EKG  EKG Interpretation  Date/Time:  Saturday July 08 2016 09:47:11 EDT Ventricular Rate:  84 PR Interval:    QRS Duration: 88 QT Interval:  385 QTC Calculation: 456 R Axis:   45 Text Interpretation:  Sinus rhythm No significant change since last tracing Confirmed by Destyn Schuyler MD, DANIEL [phone]) on 07/08/2016 10:19:38 AM       Radiology Ct Renal Stone Study  Result Date: 07/08/2016 CLINICAL DATA:  Patient with right flank pain. EXAM: CT ABDOMEN AND PELVIS WITHOUT CONTRAST TECHNIQUE: Multidetector CT imaging of the abdomen and pelvis was performed following the standard protocol without IV contrast. COMPARISON:  CT abdomen pelvis 12/03/2015 FINDINGS: Lower chest: Normal heart size. Lung bases are clear. No pleural effusion. Hepatobiliary: Liver is low in attenuation compatible with steatosis. Patient status post cholecystectomy. Pancreas: Unremarkable Spleen: Unremarkable Adrenals/Urinary Tract: Left adrenal gland is normal. Unchanged 2.6 cm right  adrenal lesion, stable dating back to 2013 kidneys are symmetric in size. No hydronephrosis. No definite ureterolithiasis. Multiple calcific densities within the pelvis favored represent phleboliths. Stomach/Bowel: No abnormal bowel wall thickening or evidence for bowel obstruction. No free fluid or free intraperitoneal air. Normal appendix. Small hiatal hernia. Vascular/Lymphatic: Normal caliber abdominal aorta. No retroperitoneal lymphadenopathy. Reproductive: Status post hysterectomy. Adnexal structures are unremarkable. Other: None. Musculoskeletal: Lower thoracic and lumbar spine degenerative changes. No aggressive or acute appearing osseous lesions. IMPRESSION: No nephroureterolithiasis.  No hydronephrosis. Hepatic steatosis. Electronically Signed   By: Lovey Newcomer M.D.   On: 07/08/2016 11:24    Procedures Procedures (including critical care time)  Medications Ordered in ED Medications  sodium chloride 0.9 % bolus 1,000 mL (0 mLs Intravenous Stopped 07/08/16 1237)  morphine 4 MG/ML injection 2 mg (2 mg Intravenous Given 07/08/16 1026)  ketorolac (TORADOL) 30 MG/ML injection 15 mg (15 mg Intravenous Given 07/08/16 1233)  acetaminophen (TYLENOL) tablet 1,000 mg (1,000 mg Oral Given 07/08/16 1233)  fluconazole (DIFLUCAN) tablet 150 mg (150 mg Oral Given 07/08/16 1306)     Initial Impression / Assessment and Plan / ED Course  I have reviewed the triage vital signs and the nursing notes.  Pertinent labs & imaging results that were available during my care of the patient were reviewed by me and considered in my medical decision making (see chart for details).     61 yo F With a chief complaint of hematuria and right  flank pain.  Patient has no history of kidney stones. Will obtain a CT stone study to evaluate. Patient also complaining of chest pain. Completely atypical for ACS. Reproducible on exam. EKG with no concerning findings. I do not feel further workup is needed at this time.  CT  negative for acute intraabdominal pathology.  INR in the 4's.  Urology follow up.   3:16 PM:  I have discussed the diagnosis/risks/treatment options with the patient and believe the pt to be eligible for discharge home to follow-up with Urology, PCP. We also discussed returning to the ED immediately if new or worsening sx occur. We discussed the sx which are most concerning (e.g., sudden worsening pain, fever, inability to tolerate by mouth) that necessitate immediate return. Medications administered to the patient during their visit and any new prescriptions provided to the patient are listed below.  Medications given during this visit Medications  sodium chloride 0.9 % bolus 1,000 mL (0 mLs Intravenous Stopped 07/08/16 1237)  morphine 4 MG/ML injection 2 mg (2 mg Intravenous Given 07/08/16 1026)  ketorolac (TORADOL) 30 MG/ML injection 15 mg (15 mg Intravenous Given 07/08/16 1233)  acetaminophen (TYLENOL) tablet 1,000 mg (1,000 mg Oral Given 07/08/16 1233)  fluconazole (DIFLUCAN) tablet 150 mg (150 mg Oral Given 07/08/16 1306)     The patient appears reasonably screen and/or stabilized for discharge and I doubt any other medical condition or other [facility] requiring further screening, evaluation, or treatment in the ED at this time prior to discharge.    Final Clinical Impressions(s) / ED Diagnoses   Final diagnoses:  Gross hematuria  Right flank pain  Yeast infection    New Prescriptions Discharge Medication List as of 07/08/2016  1:01 PM       Deno Etienne, DO 07/08/16 1516

## 2016-07-08 NOTE — ED Triage Notes (Signed)
Per EMS- pt here from home c.o. Hematuria since yesterday. Denies painful urination, does report right flank pain. 11pm began having left sided chest pressure, non radiating. Pain with deep breaths and upon palpation. Pt is on blood thinner but it is held for the weekend.

## 2016-07-08 NOTE — ED Notes (Signed)
Walked patient to the bathroom patient did well 

## 2016-07-08 NOTE — Discharge Instructions (Signed)
Follow-up with your family doctor. Follow-up with urologist for evaluation of why you have blood in your urine.  Take 4 over the counter ibuprofen tablets 3 times a day or 2 over-the-counter naproxen tablets twice a day for pain. Also take tylenol 1000mg (2 extra strength) four times a day.

## 2016-07-10 ENCOUNTER — Ambulatory Visit (HOSPITAL_BASED_OUTPATIENT_CLINIC_OR_DEPARTMENT_OTHER): Payer: Medicare Other | Admitting: Nurse Practitioner

## 2016-07-10 ENCOUNTER — Telehealth: Payer: Self-pay | Admitting: Oncology

## 2016-07-10 ENCOUNTER — Other Ambulatory Visit (HOSPITAL_BASED_OUTPATIENT_CLINIC_OR_DEPARTMENT_OTHER): Payer: Medicare Other

## 2016-07-10 VITALS — BP 149/68 | HR 92 | Temp 98.4°F | Resp 19 | Wt 275.6 lb

## 2016-07-10 DIAGNOSIS — I82403 Acute embolism and thrombosis of unspecified deep veins of lower extremity, bilateral: Secondary | ICD-10-CM

## 2016-07-10 DIAGNOSIS — Z7901 Long term (current) use of anticoagulants: Secondary | ICD-10-CM

## 2016-07-10 DIAGNOSIS — D649 Anemia, unspecified: Secondary | ICD-10-CM

## 2016-07-10 DIAGNOSIS — I824Y3 Acute embolism and thrombosis of unspecified deep veins of proximal lower extremity, bilateral: Secondary | ICD-10-CM

## 2016-07-10 DIAGNOSIS — Z86718 Personal history of other venous thrombosis and embolism: Secondary | ICD-10-CM

## 2016-07-10 DIAGNOSIS — N3001 Acute cystitis with hematuria: Secondary | ICD-10-CM

## 2016-07-10 DIAGNOSIS — R109 Unspecified abdominal pain: Secondary | ICD-10-CM | POA: Diagnosis not present

## 2016-07-10 DIAGNOSIS — N1330 Unspecified hydronephrosis: Secondary | ICD-10-CM | POA: Diagnosis not present

## 2016-07-10 LAB — CBC WITH DIFFERENTIAL/PLATELET
BASO%: 0.2 % (ref 0.0–2.0)
Basophils Absolute: 0 10*3/uL (ref 0.0–0.1)
EOS%: 1.7 % (ref 0.0–7.0)
Eosinophils Absolute: 0.1 10*3/uL (ref 0.0–0.5)
HCT: 34.2 % — ABNORMAL LOW (ref 34.8–46.6)
HGB: 11 g/dL — ABNORMAL LOW (ref 11.6–15.9)
LYMPH%: 34.7 % (ref 14.0–49.7)
MCH: 26.1 pg (ref 25.1–34.0)
MCHC: 32.2 g/dL (ref 31.5–36.0)
MCV: 81 fL (ref 79.5–101.0)
MONO#: 0.4 10*3/uL (ref 0.1–0.9)
MONO%: 5.7 % (ref 0.0–14.0)
NEUT#: 3.7 10*3/uL (ref 1.5–6.5)
NEUT%: 57.7 % (ref 38.4–76.8)
Platelets: 369 10*3/uL (ref 145–400)
RBC: 4.22 10*6/uL (ref 3.70–5.45)
RDW: 17.8 % — ABNORMAL HIGH (ref 11.2–14.5)
WBC: 6.3 10*3/uL (ref 3.9–10.3)
lymph#: 2.2 10*3/uL (ref 0.9–3.3)

## 2016-07-10 LAB — PROTIME-INR
INR: 2.2 (ref 2.00–3.50)
Protime: 26.4 Seconds — ABNORMAL HIGH (ref 10.6–13.4)

## 2016-07-10 LAB — CHCC SMEAR

## 2016-07-10 LAB — FERRITIN: Ferritin: 61 ng/ml (ref 9–269)

## 2016-07-10 MED ORDER — CIPROFLOXACIN HCL 500 MG PO TABS: 500.0000 mg | ORAL_TABLET | Freq: Two times a day (BID) | ORAL | 0 refills | Status: DC

## 2016-07-10 MED ORDER — WARFARIN SODIUM 5 MG PO TABS: 5.0000 mg | ORAL_TABLET | Freq: Every day | ORAL | 3 refills | Status: DC

## 2016-07-10 NOTE — Progress Notes (Signed)
[facility] OFFICE PROGRESS NOTE   Diagnosis:  Chronic anticoagulation  INTERVAL HISTORY:   [name] returns as scheduled. The PT/INR returned supratherapeutic last week. Coumadin was placed on hold. She was seen in the emergency department 07/08/2016 with hematuria and flank pain. The INR returned at 4.36. Urinalysis showed red cells and white cells. CT was negative for kidney stone.  She reports she has been taking Coumadin 10 mg daily for the past 2 months because she did not have any 5 mg tablets. The hematuria has resolved. She continues to have right flank pain. No bloody or black stools. She reports she is having problems with gout and arthritis.  Objective:  Vital signs in last 24 hours:  Blood pressure (!) 149/68, pulse 92, temperature 98.4 F (36.9 C), resp. rate 19, weight 275 lb 9 oz (125 kg), SpO2 97 %.    HEENT: No bleeding in the oral cavity. Resp: Lungs clear bilaterally. Cardio: Regular rate and rhythm. GI: Abdomen soft and nontender. Vascular: No leg edema. Neuro: Alert and oriented.  Skin: No rash. Musculoskeletal: Tender over the right flank region.    Lab Results:  Lab Results  Component Value Date   WBC 6.3 07/10/2016   HGB 11.0 (L) 07/10/2016   HCT 34.2 (L) 07/10/2016   MCV 81.0 07/10/2016   PLT 369 07/10/2016   NEUTROABS 3.7 07/10/2016    Imaging:  No results found.  Medications: I have reviewed the patient's current medications.  Assessment/Plan: 1. History of bilateral lower extremity deep vein thrombosis. She continues Coumadin followed through our office.  2. History of a positive lupus anticoagulant. 3. History of a positive beta-2-glycoprotein IgA antibody. 4. Chronic bilateral leg pain - negative bilateral Doppler January 2010. 5. History of hypertension. 6. COPD. 7. Status post hysterectomy. 8. Remote history of cocaine abuse. 9. History of an adrenal lesion on a CT scan in 2008 - status post a repeat CT on  June 16, 2009, showing slow enlargement of the right adrenal nodule since 2006, most consistent with an adenoma. Stable on a CT 04/24/2014 10. History of rectal and vaginal bleeding in May 2009.  11. Status post carpal tunnel surgery. 12. 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 Right knee replacement September 2011. 13. Multiple orthopedic conditions. 14. Placement of IVC filter preoperatively before knee replacement. The IVC filter was retrieved 03/01/2011. 15. Anemia. Ferritin in normal range at 82 on 08/18/2013; 61 on 07/10/2016 16. Status post lumbar fusion surgery October 2013 17. Right hydronephrosis-evaluated by Dr. Tresa Moore   Disposition:[name] is maintained on chronic Coumadin anticoagulation for a history of bilateral lower extremity DVTs. She has been taking Coumadin had an incorrect dose for the past 2 months. She will resume Coumadin at a dose of 5 mg daily. A new prescription was sent to her pharmacy. She will return for a follow-up PT/INR in one week.  The hematuria may have been related to the supratherapeutic PT/INR as well as a urinary tract infection. She no longer has gross hematuria. She will complete a 3 day course of ciprofloxacin. She will follow-up with her PCP if the flank pain persists.  She is mildly anemic. We will continue to follow this.  She will return for labs in one week and a follow-up visit in 2 weeks. She will contact the office in the interim with any problems.  Plan reviewed with Dr. Benay Spice.        Ned Card ANP/GNP-BC   07/10/2016  1:42 PM

## 2016-07-10 NOTE — Telephone Encounter (Signed)
Appointments scheduled per 07/10/16 los. Patient was given a copy of the AVS report and appointment schedule per 07/10/16 los.  °

## 2016-07-12 ENCOUNTER — Ambulatory Visit: Payer: Medicare Other | Admitting: Podiatry

## 2016-07-17 ENCOUNTER — Other Ambulatory Visit: Payer: Medicare Other

## 2016-07-24 ENCOUNTER — Other Ambulatory Visit (HOSPITAL_BASED_OUTPATIENT_CLINIC_OR_DEPARTMENT_OTHER): Payer: Medicare Other

## 2016-07-24 ENCOUNTER — Telehealth: Payer: Self-pay | Admitting: Oncology

## 2016-07-24 ENCOUNTER — Ambulatory Visit (HOSPITAL_BASED_OUTPATIENT_CLINIC_OR_DEPARTMENT_OTHER): Payer: Medicare Other | Admitting: Oncology

## 2016-07-24 ENCOUNTER — Telehealth: Payer: Self-pay | Admitting: *Deleted

## 2016-07-24 VITALS — BP 155/90 | HR 83 | Temp 98.1°F | Resp 21 | Ht 66.0 in | Wt 276.4 lb

## 2016-07-24 DIAGNOSIS — M79604 Pain in right leg: Secondary | ICD-10-CM | POA: Diagnosis not present

## 2016-07-24 DIAGNOSIS — M79605 Pain in left leg: Secondary | ICD-10-CM | POA: Diagnosis not present

## 2016-07-24 DIAGNOSIS — I82403 Acute embolism and thrombosis of unspecified deep veins of lower extremity, bilateral: Secondary | ICD-10-CM

## 2016-07-24 DIAGNOSIS — Z7901 Long term (current) use of anticoagulants: Secondary | ICD-10-CM

## 2016-07-24 DIAGNOSIS — Z86718 Personal history of other venous thrombosis and embolism: Secondary | ICD-10-CM

## 2016-07-24 DIAGNOSIS — N3001 Acute cystitis with hematuria: Secondary | ICD-10-CM

## 2016-07-24 DIAGNOSIS — M7989 Other specified soft tissue disorders: Secondary | ICD-10-CM | POA: Diagnosis not present

## 2016-07-24 LAB — CBC WITH DIFFERENTIAL/PLATELET
BASO%: 0.9 % (ref 0.0–2.0)
Basophils Absolute: 0 10*3/uL (ref 0.0–0.1)
EOS%: 2.4 % (ref 0.0–7.0)
Eosinophils Absolute: 0.1 10*3/uL (ref 0.0–0.5)
HCT: 35.6 % (ref 34.8–46.6)
HGB: 11.4 g/dL — ABNORMAL LOW (ref 11.6–15.9)
LYMPH%: 38.5 % (ref 14.0–49.7)
MCH: 26.2 pg (ref 25.1–34.0)
MCHC: 32.1 g/dL (ref 31.5–36.0)
MCV: 81.6 fL (ref 79.5–101.0)
MONO#: 0.3 10*3/uL (ref 0.1–0.9)
MONO%: 6.3 % (ref 0.0–14.0)
NEUT#: 2.5 10*3/uL (ref 1.5–6.5)
NEUT%: 51.9 % (ref 38.4–76.8)
Platelets: 340 10*3/uL (ref 145–400)
RBC: 4.36 10*6/uL (ref 3.70–5.45)
RDW: 18.1 % — ABNORMAL HIGH (ref 11.2–14.5)
WBC: 4.9 10*3/uL (ref 3.9–10.3)
lymph#: 1.9 10*3/uL (ref 0.9–3.3)

## 2016-07-24 LAB — PROTIME-INR
INR: 4.9 — ABNORMAL HIGH (ref 2.00–3.50)
Protime: 58.8 Seconds — ABNORMAL HIGH (ref 10.6–13.4)

## 2016-07-24 NOTE — Telephone Encounter (Signed)
Appointments scheduled per 5.7.18 LOS. Patient given AVS report and calendars with future scheduled appointments. °

## 2016-07-24 NOTE — Progress Notes (Signed)
  [name] OFFICE PROGRESS NOTE   Diagnosis: Chronic anti-coagulation  INTERVAL HISTORY:   [name] returns as scheduled. She denies recurrent hematuria. No rectal bleeding. She resume Coumadin at a dose of 5 mg daily and 10 mg twice weekly. She complains of pain at the left ankle that is worse with weightbearing. She thinks this may be a "gout "attack. She has recent pain in the lower legs and ankles bilaterally.  Objective:  Vital signs in last 24 hours:  Blood pressure (!) 155/90, pulse 83, temperature 98.1 F (36.7 C), temperature source Oral, resp. rate (!) 21, height 5\' 6"  (1.676 m), weight 276 lb 6.4 oz (125.4 kg), SpO2 99 %.   Resp: Lungs clear bilaterally Cardio: Regular rate and rhythm GI: No hepatosplenomegaly, nontender Vascular: No leg edema, firm 1 m area at the right calf-? Chronically thrombosed varicose vein Musculoskeletal: Mild swelling of the lateral aspect of the left ankle joint. No erythema or warmth. Mild pain with motion at the left ankle.    Lab Results:  Lab Results  Component Value Date   WBC 4.9 07/24/2016   HGB 11.4 (L) 07/24/2016   HCT 35.6 07/24/2016   MCV 81.6 07/24/2016   PLT 340 07/24/2016   NEUTROABS 2.5 07/24/2016  PT/INR 4.9  Medications: I have reviewed the patient's current medications.  Assessment/Plan: 1. History of bilateral lower extremity deep vein thrombosis. She continues Coumadin anticoagulation, we adjusted the Coumadin dose today 2. History of a positive lupus anticoagulant. 3. History of a positive beta-2-glycoprotein IgA antibody. 4. Chronic bilateral leg pain - negative bilateral Doppler January 2010. 5. History of hypertension. 6. COPD. 7. Status post hysterectomy. 8. Remote history of cocaine abuse. 9. History of an adrenal lesion on a CT scan in 2008 - status post a repeat CT on June 16, 2009, showing slow enlargement of the right adrenal nodule since 2006, most consistent with an adenoma. Stable  on a CT 04/24/2014 10. History of rectal and vaginal bleeding in May 2009.  11. Status post carpal tunnel surgery. 12. 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 Right knee replacement September 2011. 13. Multiple orthopedic conditions. 14. Placement of IVC filter preoperatively before knee replacement. The IVC filter was retrieved 03/01/2011. 15. Anemia. Ferritin in normal range at 82 on 08/18/2013; 61 on 07/10/2016 16. Status post lumbar fusion surgery October 2013 17. Right hydronephrosis-evaluated by Dr. Tresa Moore 18. Left ankle swelling/pain 07/24/2016   Disposition:  [name] continues Coumadin anticoagulation. The PT/INR is supratherapeutic today. We adjusted the Coumadin dose. She will return for a PT/INR on 08/02/2016. We we will continue adjusting the Coumadin dose as indicated. [name] has swelling and pain at the left ankle. I have a low clinical suspicion for a gout flare. She plans to schedule an appointment with Dr. Marlou Sa to evaluate the left ankle. The hemoglobin is improved today. She denies further hematuria. We will check a CBC when she returns for an office visit in 4 months.  25 minutes were spent with the patient today. The majority of the time was used for counseling and coordination of care.  Colleen Coder, MD  07/24/2016  12:18 PM

## 2016-07-24 NOTE — Telephone Encounter (Signed)
Pt reports she did not take Coumadin as prescribed during last visit. She has been taking 10 mg twice a week.  Per Dr. Benay Spice: Pt instructed to HOLD COUMADIN 5/7 and 07/25/16 then take 5 mg daily beginning 5/9. She voiced understanding.

## 2016-08-02 ENCOUNTER — Telehealth: Payer: Self-pay | Admitting: *Deleted

## 2016-08-02 ENCOUNTER — Other Ambulatory Visit (HOSPITAL_BASED_OUTPATIENT_CLINIC_OR_DEPARTMENT_OTHER): Payer: Medicare Other

## 2016-08-02 DIAGNOSIS — Z86718 Personal history of other venous thrombosis and embolism: Secondary | ICD-10-CM

## 2016-08-02 DIAGNOSIS — I82403 Acute embolism and thrombosis of unspecified deep veins of lower extremity, bilateral: Secondary | ICD-10-CM

## 2016-08-02 LAB — PROTIME-INR
INR: 1.9 — ABNORMAL LOW (ref 2.00–3.50)
Protime: 22.8 Seconds — ABNORMAL HIGH (ref 10.6–13.4)

## 2016-08-02 IMAGING — CT CT ABD-PELV W/O CM
1 series · 13 of 18 positions shown, 18 images · non-contrast
Comparison: January 29, 2012

CLINICAL DATA: Progressive right lower quadrant and flank pain

EXAM:
CT ABDOMEN AND PELVIS WITHOUT CONTRAST
TECHNIQUE: Multidetector CT imaging of the abdomen and pelvis was performed
following the standard protocol without oral or intravenous contrast
material administration.

[Series 4: lung · axial · 0.84mm/px · z∈[+1550,+1624]mm · 13 of 18 slices shown, 18 images]
[im 2/18  soft-tissue]
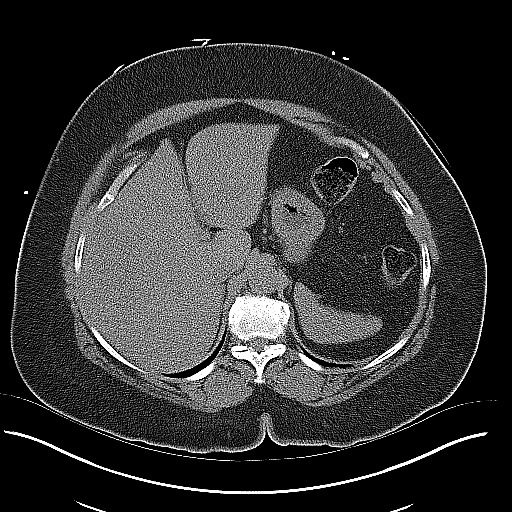
[im 2/18  bone]
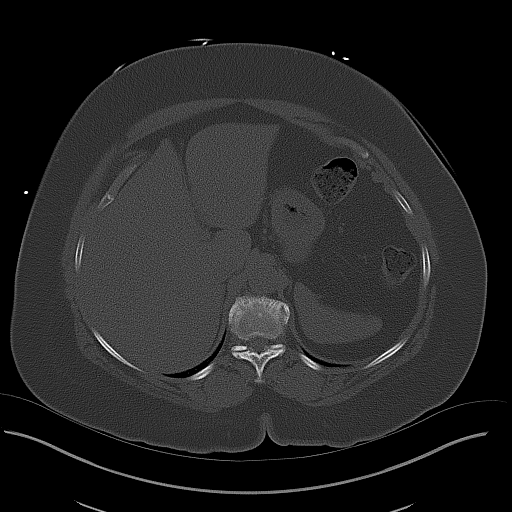
[im 4/18  soft-tissue]
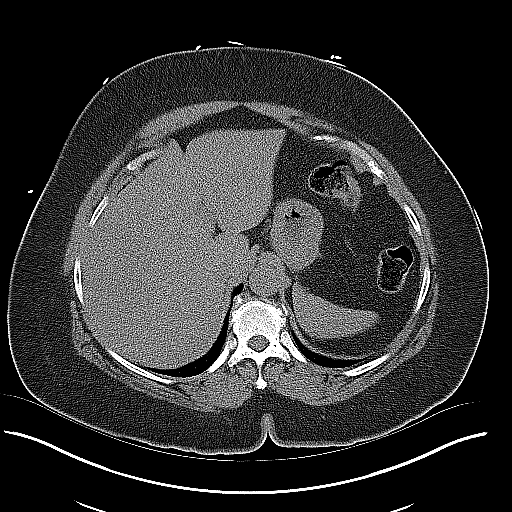
[im 5/18  soft-tissue]
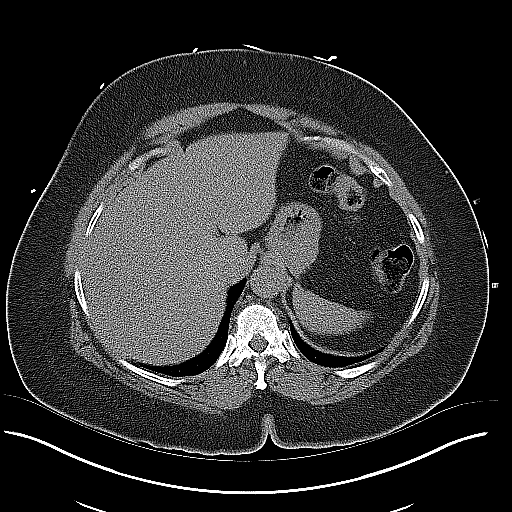
[im 6/18  soft-tissue]
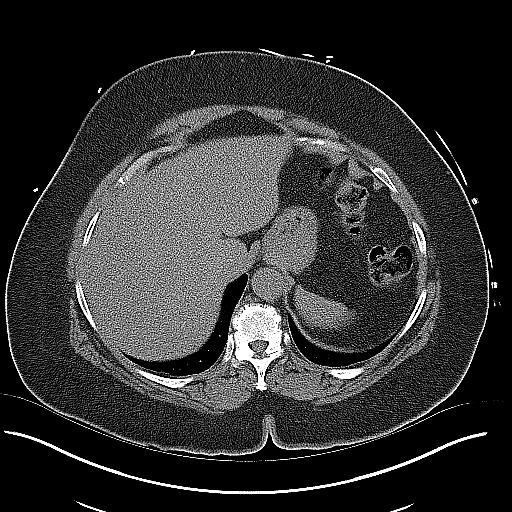
[im 8/18  soft-tissue]
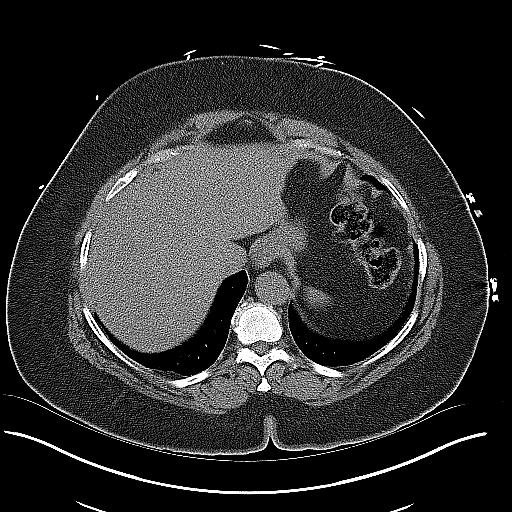
[im 9/18  soft-tissue]
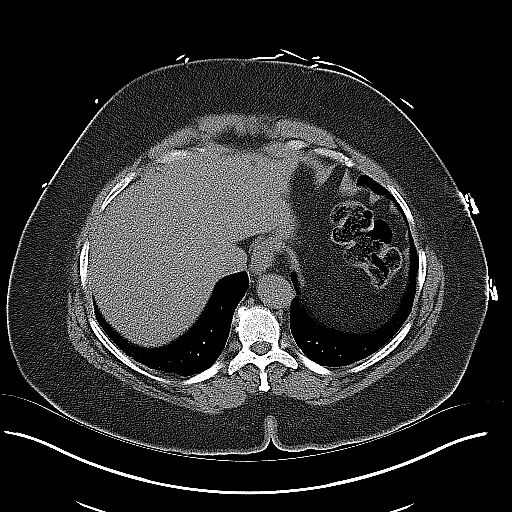
[im 10/18  soft-tissue]
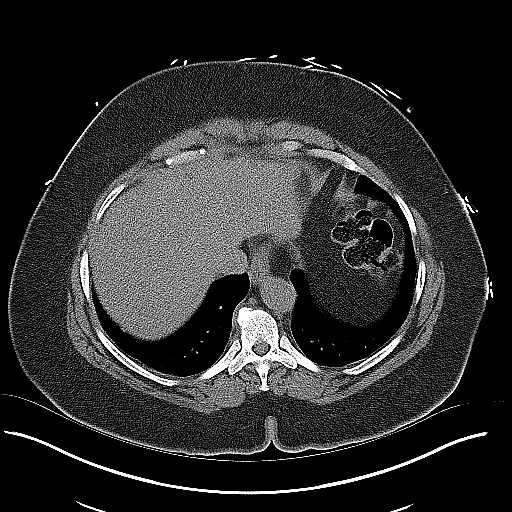
[im 12/18  soft-tissue]
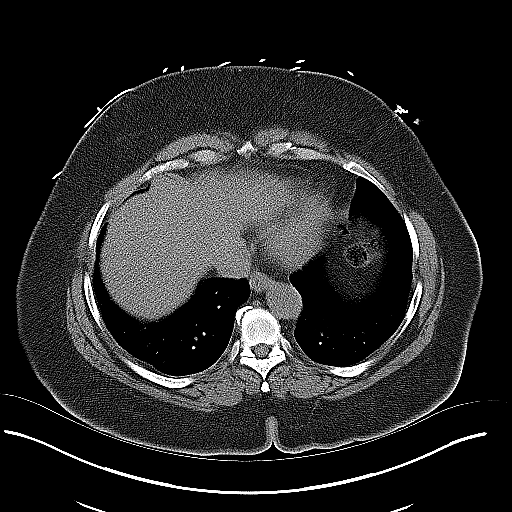
[im 13/18  soft-tissue]
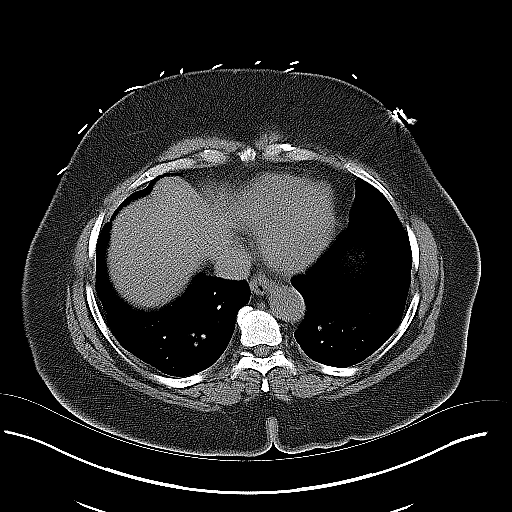
[im 13/18  bone]
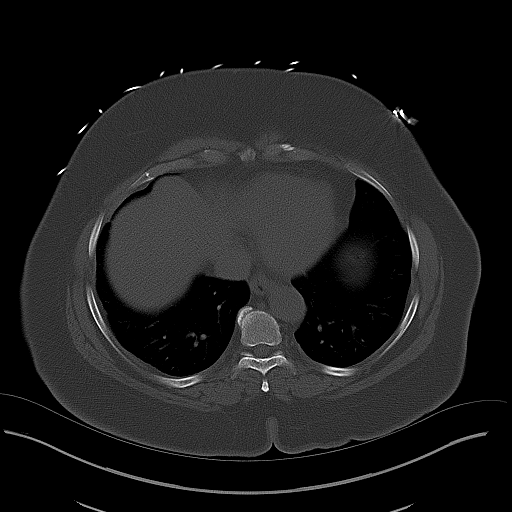
[im 14/18  soft-tissue]
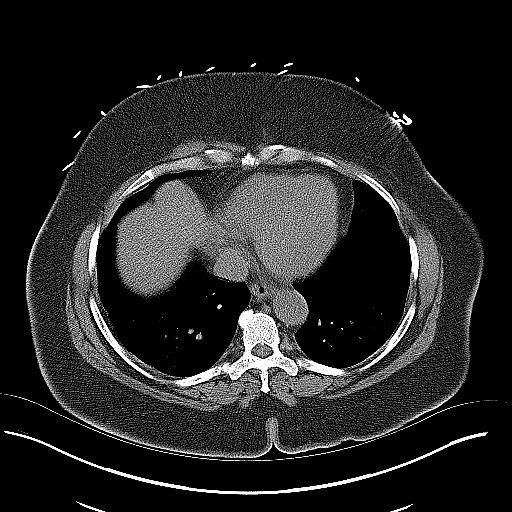
[im 14/18  lung]
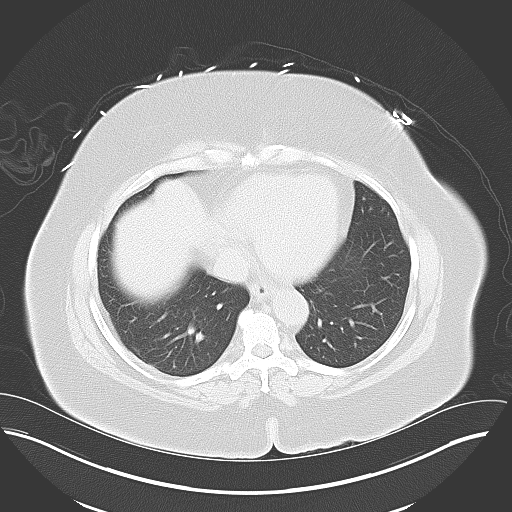
[im 15/18  lung]
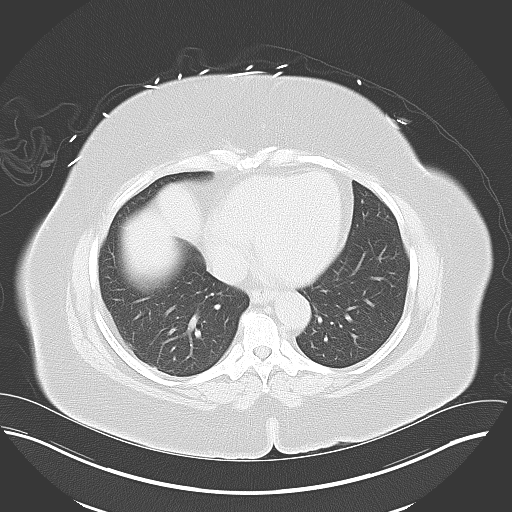
[im 16/18  soft-tissue]
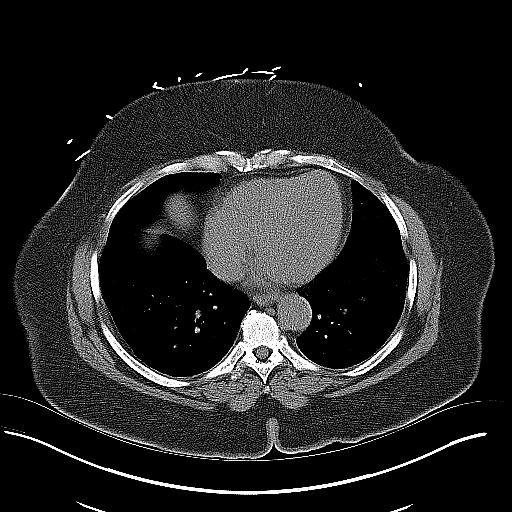
[im 16/18  lung]
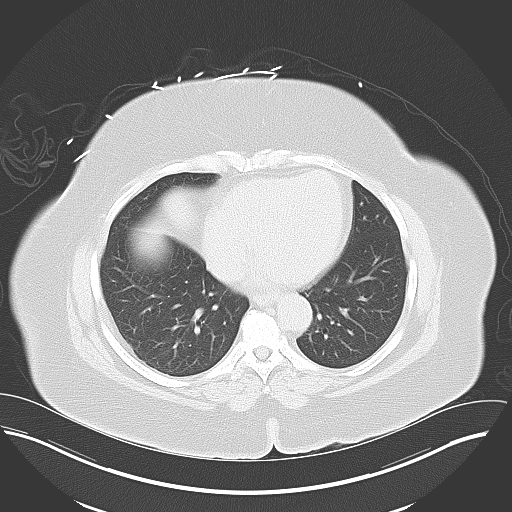
[im 17/18  soft-tissue]
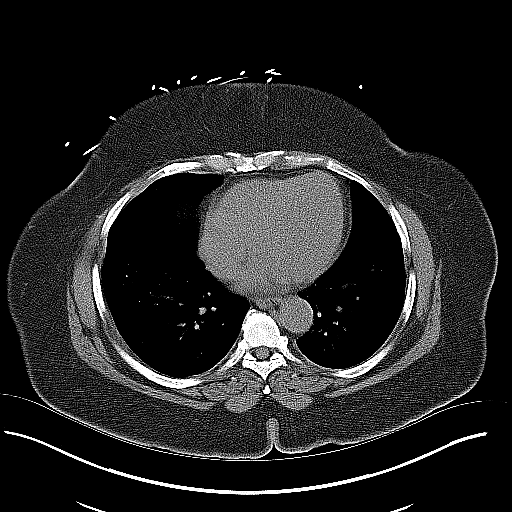
[im 17/18  lung]
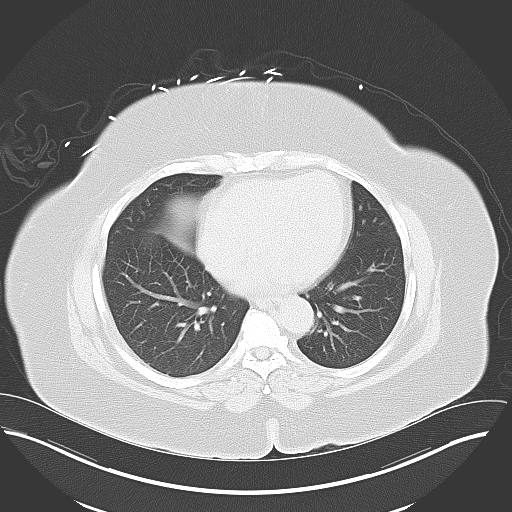

[13 of 18 positions shown; findings below may reference images not displayed]

FINDINGS: On axial slice 8 series 4, there is a 5 mm nodular opacity in the
posterior segment of the right lower lobe. Lung bases are otherwise
clear.

Liver is prominent measuring 17.7 cm in length. There is hepatic
steatosis. There is a 4 mm cyst in the left lobe of the liver,
medial segment anteriorly. No other focal liver lesions are
appreciable on this noncontrast enhanced study. Gallbladder is
absent. There is no appreciable biliary duct dilatation.

Spleen, pancreas, and left adrenal appear normal. There is an
apparent adenoma in the right adrenal measuring 2.6 by 2.2 cm.

Kidneys bilaterally show no evidence of mass or hydronephrosis on
either side. There is no renal or ureteral calculus on either side.
There are vascular phleboliths medial to the lower pole left kidney.

There is a small ventral hernia containing only fat. There is some
scarring in the periumbilical region.

In the pelvis, the urinary bladder is midline with normal wall
thickness. Uterus is absent. There is no pelvic mass or fluid.
Appendix appears normal.

There is no bowel obstruction. No free air or portal venous air.
There is no appreciable ascites, adenopathy, or abscess in the
abdomen or pelvis. There is no demonstrable abdominal aortic
aneurysm. There is postoperative change in the lumbar spine with
fusion at L4 and L5. There are no blastic or lytic bone lesions.
IMPRESSION: No renal or ureteral calculus. No hydronephrosis. No appendiceal or
periappendiceal region inflammation.

No bowel obstruction.  No abscess.

Liver prominent with hepatic steatosis.

5 mm nodular opacity posterior right lung base. Followup of this
nodular opacity should be based on [HOSPITAL] guidelines. If
the patient is at high risk for bronchogenic carcinoma, follow-up
chest CT at 6-12 months is recommended. If the patient is at low
risk for bronchogenic carcinoma, follow-up chest CT at 12 months is
recommended. This recommendation follows the consensus statement:
Guidelines for Management of Small Pulmonary Nodules Detected on CT
Scans: A Statement from the [HOSPITAL] as published in

Stable right adrenal adenoma.  Uterus absent.

## 2016-08-02 NOTE — Telephone Encounter (Signed)
Called pt, she confirms she is taking warfarin 5 mg daily. Instructed her to continue same dose, per Dr. Benay Spice. Check PT in 3 weeks. She voiced understanding. Message to schedulers for appt.

## 2016-08-05 ENCOUNTER — Telehealth: Payer: Self-pay

## 2016-08-05 NOTE — Telephone Encounter (Signed)
Called and left a message with lab appt per inbox  Mykel Mohl

## 2016-08-09 ENCOUNTER — Encounter (INDEPENDENT_AMBULATORY_CARE_PROVIDER_SITE_OTHER): Payer: Self-pay | Admitting: Orthopedic Surgery

## 2016-08-09 ENCOUNTER — Ambulatory Visit (INDEPENDENT_AMBULATORY_CARE_PROVIDER_SITE_OTHER): Payer: Medicare Other | Admitting: Orthopedic Surgery

## 2016-08-09 ENCOUNTER — Ambulatory Visit (INDEPENDENT_AMBULATORY_CARE_PROVIDER_SITE_OTHER): Payer: Medicare Other

## 2016-08-09 DIAGNOSIS — M79672 Pain in left foot: Secondary | ICD-10-CM

## 2016-08-09 DIAGNOSIS — M25562 Pain in left knee: Secondary | ICD-10-CM

## 2016-08-09 NOTE — Progress Notes (Signed)
Office Visit Note   Patient: [name]           Date of Birth: 1955/06/01           MRN: [id number] Visit Date: 08/09/2016 Requested by: Colleen Ebbs, MD [address] [place], [place] [address] PCP: Colleen Ebbs, MD  Subjective: Chief Complaint  Patient presents with  . Left Knee - Pain  . Left Ankle - Pain   [name] is a patient with left knee and left ankle pain.  She states her pain is worse with ambulation.  Describes swelling primarily in the ankle.  She in place with a walker.  She's describes weakness and giving way.  She states she [facility] the [facility] and Dr." and that he believe she should get her left lower leg checked.  She does have a history of deep vein thrombosis and is on blood thinners. HPI: All systems reviewed are negative as they relate to the chief complaint within the history of present illness.  Patient denies  fevers or chills.               ROS: All systems reviewed are negative as they relate to the chief complaint within the history of present illness.  Patient denies  fevers or chills.   Assessment & Plan: Visit Diagnoses:  1. Left knee pain, unspecified chronicity   2. Left foot pain     Plan: Impression is left ankle and left knee pain.  Radiographs of the ankle and knee are unremarkable.  She does have little bit of asymmetric soft tissue swelling posterior laterally but no discrete masses in this area.  No calf tenderness no warmth to the knee no effusion.  Plan is for observation.  I am going to try her with an ankle brace.  Could consider MRI scanning in 4-6 weeks if her symptoms persist.  I'll see her back as needed  Follow-Up Instructions: No Follow-up on file.   Orders:  Orders Placed This Encounter  Procedures  . XR Ankle Complete Left  . XR Knee 1-2 Views Left   No orders of the defined types were placed in this encounter.     Procedures: No procedures performed   Clinical Data: No additional  findings.  Objective: Vital Signs: There were no vitals taken for this visit.  Physical Exam:   Constitutional: Patient appears well-developed HEENT:  Head: Normocephalic Eyes:EOM are normal Neck: Normal range of motion Cardiovascular: Normal rate Pulmonary/chest: Effort normal Neurologic: Patient is alert Skin: Skin is warm Psychiatric: Patient has normal mood and affect    Ortho Exam: Orthopedic exam demonstrates pretty normal gait and alignment.  Palpable intact nontender anterior to posterior tib peroneal and Achilles tendon bilaterally palpable pulses symmetric reflexes little bit of slight soft tissue swelling posterior laterally on the left ankle compared to the right.  Knee has full range of motion with no effusion no warmth no groin pain with internal/external rotation leg no nerve retention signs  Specialty Comments:  No specialty comments available.  Imaging: Xr Ankle Complete Left  Result Date: 08/09/2016 3 views left ankle reviewed.  AP lateral mortise.  No degenerative changes noted.  No fractures present.  Mortise is symmetric and maintained.  Syndesmosis also symmetric.  Small lucency noted on the medial talar dome.  Xr Knee 1-2 Views Left  Result Date: 08/09/2016 AP lateral left knee reviewed.  No evidence of loosening at the bone cement interface.  Alignment intact.  Patella centered relative to  the femur    PMFS History: Patient Active Problem List   Diagnosis Date Noted  . Chronic pain of right knee 05/24/2016  . Effusion, right knee 04/28/2016  . Presence of right artificial knee joint 04/28/2016  . Abnormal LFTs   . Decreased sensation   . Paresthesia 06/12/2014  . Obstruction of kidney 04/24/2014  . Diabetes type 2, uncontrolled ([facility]) 04/24/2014  . Chronic anticoagulation 04/24/2014  . Crack cocaine use 04/24/2014  . Depression 04/24/2014  . Fibromyalgia 04/24/2014  . COPD (chronic obstructive pulmonary disease) ([facility]) 04/24/2014  .  Obstructive sleep apnea on CPAP 04/24/2014  . Lupus anticoagulant positive 04/24/2014  . Adrenal mass, right ([place]) 04/24/2014  . Right kidney mass 04/24/2014  . Hydronephrosis of right kidney 04/24/2014  . Supratherapeutic INR 04/24/2014  . Renal mass, right 04/24/2014  . Diabetes type 2, controlled ([facility])   . Right lower quadrant abdominal pain   . Morbid obesity ([facility]) 08/21/2013  . Unspecified constipation 08/19/2013  . COPD exacerbation ([facility]) 08/17/2013  . Morbid obesity with BMI of 45.0-49.9, adult ([facility]) 01/05/2012  . Bilateral deep vein thromboses ([facility]) 12/13/2011  . Abdominal pain 11/05/2011  . Hypokalemia 11/05/2011  . Sciatica 11/05/2011  . Type II or unspecified type diabetes mellitus without mention of complication, not stated as uncontrolled 11/05/2011  . HTN (hypertension) 11/05/2011   Past Medical History:  Diagnosis Date  . Adrenal mass ([facility]) 03/226   Benign  . Arthritis    knees/multiple orthopedic conditons; lower back  . Asthma    per pt  . 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 Depression   . Diabetes mellitus    120s usually fasting  . Dizziness, nonspecific   . DVT (deep venous thrombosis) ([facility])    Recurrent  . Fibromyalgia   . GERD (gastroesophageal reflux disease)   . Heart murmur   . History of blood transfusion   . History of cocaine abuse    Remote history   . Hypertension    takes meds daily  . Lupus Anticoagulant Positive   . On home oxygen therapy    at night  . Shortness of breath    exertion or lying flat  . Sleep apnea    2l of oxygen at night    Family History  Problem Relation Age of Onset  . Hypertension Mother   . Diabetes Mother   . Cancer Mother        Pancreatic  . Hypertension Father   . Heart failure Father     Past Surgical History:  Procedure Laterality Date  . ABDOMINAL HYSTERECTOMY  1989    Fibroids  . ANTERIOR LUMBAR FUSION  01/03/2012   Procedure: ANTERIOR LUMBAR FUSION 1 LEVEL;  Surgeon: Winfield Cunas, MD;  Location: [facility];  Service: Neurosurgery;  Laterality: N/A;  Lumbar Four-Five Anterior Lumbar Interbody Fusion with Instrumentation  . BACK SURGERY     cervical spine---disk disease  . CARPAL TUNNEL RELEASE     Bilateral  . CESAREAN SECTION    . CYSTOSCOPY/RETROGRADE/URETEROSCOPY/STONE EXTRACTION WITH BASKET Right 04/26/2014   Procedure: CYSTOSCOPY/ RIGHT RETROGRADE/ RIGHT URETEROSCOPY/URETERAL AND RENAL PELVIS BIOPSY;  Surgeon: Alexis Frock, MD;  Location: [facility];  Service: Urology;  Laterality: Right;  . gallstones removed    . 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   bilateral  . right elbow surgery    . TUBAL LIGATION  Social History   Occupational History  . Not on file.   Social History Main Topics  . Smoking status: Former Smoker    Start date: 03/20/2001    Quit date: 03/21/2003  . Smokeless tobacco: Never Used     Comment: smoked for 2 years  . Alcohol use No     Comment: beer in past   . Drug use: Yes    Types: Cocaine     Comment: quit 2003-rehab progam in 2005  . Sexual activity: No

## 2016-08-22 ENCOUNTER — Encounter: Payer: Self-pay | Admitting: Podiatry

## 2016-08-22 ENCOUNTER — Ambulatory Visit (INDEPENDENT_AMBULATORY_CARE_PROVIDER_SITE_OTHER): Payer: Medicare Other | Admitting: Podiatry

## 2016-08-22 VITALS — BP 170/112 | HR 81

## 2016-08-22 DIAGNOSIS — B351 Tinea unguium: Secondary | ICD-10-CM

## 2016-08-22 DIAGNOSIS — E119 Type 2 diabetes mellitus without complications: Secondary | ICD-10-CM | POA: Diagnosis not present

## 2016-08-22 DIAGNOSIS — M79676 Pain in unspecified toe(s): Secondary | ICD-10-CM

## 2016-08-22 NOTE — Patient Instructions (Signed)

## 2016-08-22 NOTE — Progress Notes (Signed)
Patient ID: [name], female   DOB: [birth year], 61 y.o.   MRN: [id number]    Subjective: This patient presents today complaining of thickened and elongated toenails which are uncomfortable walking wearing shoes and  requests toenail debridement.  Objective:  Orientated 3  Vascular: No peripheral edema noted bilaterally DP and PT pulses 2/4 bilaterally Capillary reflex immediate bilaterally  Neurological: Sensation to 10 g monofilament wire intact 5/5 bilaterally Vibratory sensation reactive bilaterally Ankle reflex equal and reactive bilaterally  Dermatological: Dry skin bilaterally No open skin lesions noted bilaterally The toenails are elongated, brittle, discolored, incurvated and tender direct palpation 6-10 Absent hair growth bilaterally  Musculoskeletal: Patient using roller walker There is no restriction ankle, subtalar, midtarsal joints bilaterally No deformities noted bilaterally Ankle wrapped left       Assessment: Diabetic without complication Symptomatic onychomycoses 6-10  Plan: Debridement toenails 10 mechanicallyand electrically without any bleeding  Reappoint 3 months

## 2016-08-23 ENCOUNTER — Telehealth: Payer: Self-pay | Admitting: *Deleted

## 2016-08-23 ENCOUNTER — Other Ambulatory Visit: Payer: Self-pay | Admitting: *Deleted

## 2016-08-23 ENCOUNTER — Other Ambulatory Visit (HOSPITAL_BASED_OUTPATIENT_CLINIC_OR_DEPARTMENT_OTHER): Payer: Medicare Other

## 2016-08-23 DIAGNOSIS — Z86718 Personal history of other venous thrombosis and embolism: Secondary | ICD-10-CM

## 2016-08-23 DIAGNOSIS — Z7901 Long term (current) use of anticoagulants: Secondary | ICD-10-CM

## 2016-08-23 DIAGNOSIS — N3001 Acute cystitis with hematuria: Secondary | ICD-10-CM

## 2016-08-23 LAB — PROTIME-INR
INR: 1.6 — ABNORMAL LOW (ref 2.00–3.50)
Protime: 19.2 Seconds — ABNORMAL HIGH (ref 10.6–13.4)

## 2016-08-23 NOTE — Telephone Encounter (Signed)
Call placed to patient to notify her per order of L. Thomas NP to take Coumadin 5 mg every day except for Monday and Thursday when she is to take 7.5 mg and that we will recheck PT in 2 weeks. Teach back done and pt wrote down directions for Coumadin and read back to RN.  Patient appreciative of call and has no questions at this time. Message sent to scheduling.

## 2016-08-31 ENCOUNTER — Telehealth: Payer: Self-pay | Admitting: Oncology

## 2016-08-31 NOTE — Telephone Encounter (Signed)
lvm to inform pt of 6/20 lab appt per sch msg

## 2016-09-01 ENCOUNTER — Emergency Department (HOSPITAL_COMMUNITY): Payer: No Typology Code available for payment source

## 2016-09-01 ENCOUNTER — Emergency Department (HOSPITAL_COMMUNITY)
Admission: EM | Admit: 2016-09-01 | Discharge: 2016-09-01 | Disposition: A | Discharge status: Home / Self Care | Payer: No Typology Code available for payment source | Attending: Emergency Medicine | Admitting: Emergency Medicine

## 2016-09-01 ENCOUNTER — Encounter (HOSPITAL_COMMUNITY): Payer: Self-pay

## 2016-09-01 DIAGNOSIS — E119 Type 2 diabetes mellitus without complications: Secondary | ICD-10-CM | POA: Insufficient documentation

## 2016-09-01 DIAGNOSIS — Y92410 Unspecified street and highway as the place of occurrence of the external cause: Secondary | ICD-10-CM | POA: Insufficient documentation

## 2016-09-01 DIAGNOSIS — E041 Nontoxic single thyroid nodule: Secondary | ICD-10-CM | POA: Insufficient documentation

## 2016-09-01 DIAGNOSIS — R109 Unspecified abdominal pain: Secondary | ICD-10-CM | POA: Diagnosis not present

## 2016-09-01 DIAGNOSIS — Y999 Unspecified external cause status: Secondary | ICD-10-CM | POA: Diagnosis not present

## 2016-09-01 DIAGNOSIS — Z96653 Presence of artificial knee joint, bilateral: Secondary | ICD-10-CM | POA: Insufficient documentation

## 2016-09-01 DIAGNOSIS — S20212A Contusion of left front wall of thorax, initial encounter: Secondary | ICD-10-CM | POA: Diagnosis not present

## 2016-09-01 DIAGNOSIS — Y939 Activity, unspecified: Secondary | ICD-10-CM | POA: Insufficient documentation

## 2016-09-01 DIAGNOSIS — Z7901 Long term (current) use of anticoagulants: Secondary | ICD-10-CM | POA: Insufficient documentation

## 2016-09-01 DIAGNOSIS — J449 Chronic obstructive pulmonary disease, unspecified: Secondary | ICD-10-CM | POA: Diagnosis not present

## 2016-09-01 DIAGNOSIS — R51 Headache: Secondary | ICD-10-CM | POA: Insufficient documentation

## 2016-09-01 DIAGNOSIS — Z7984 Long term (current) use of oral hypoglycemic drugs: Secondary | ICD-10-CM | POA: Diagnosis not present

## 2016-09-01 DIAGNOSIS — I10 Essential (primary) hypertension: Secondary | ICD-10-CM | POA: Insufficient documentation

## 2016-09-01 DIAGNOSIS — E279 Disorder of adrenal gland, unspecified: Secondary | ICD-10-CM | POA: Diagnosis not present

## 2016-09-01 DIAGNOSIS — E876 Hypokalemia: Secondary | ICD-10-CM | POA: Insufficient documentation

## 2016-09-01 DIAGNOSIS — Z87891 Personal history of nicotine dependence: Secondary | ICD-10-CM | POA: Diagnosis not present

## 2016-09-01 DIAGNOSIS — S299XXA Unspecified injury of thorax, initial encounter: Secondary | ICD-10-CM | POA: Diagnosis present

## 2016-09-01 LAB — PROTIME-INR
INR: 1.96
Prothrombin Time: 22.6 seconds — ABNORMAL HIGH (ref 11.4–15.2)

## 2016-09-01 LAB — COMPREHENSIVE METABOLIC PANEL
ALT: 24 U/L (ref 14–54)
AST: 22 U/L (ref 15–41)
Albumin: 3.7 g/dL (ref 3.5–5.0)
Alkaline Phosphatase: 90 U/L (ref 38–126)
Anion gap: 12 (ref 5–15)
BUN: 14 mg/dL (ref 6–20)
CO2: 26 mmol/L (ref 22–32)
Calcium: 8.8 mg/dL — ABNORMAL LOW (ref 8.9–10.3)
Chloride: 102 mmol/L (ref 101–111)
Creatinine, Ser: 0.78 mg/dL (ref 0.44–1.00)
GFR calc Af Amer: >60 mL/min (ref >60)
GFR calc non Af Amer: >60 mL/min (ref >60)
Glucose, Bld: 161 mg/dL — ABNORMAL HIGH (ref 65–99)
Potassium: 2.8 mmol/L — ABNORMAL LOW (ref 3.5–5.1)
Sodium: 140 mmol/L (ref 135–145)
Total Bilirubin: 0.3 mg/dL (ref 0.3–1.2)
Total Protein: 8.1 g/dL (ref 6.5–8.1)

## 2016-09-01 LAB — CBC WITH DIFFERENTIAL/PLATELET
Basophils Absolute: 0 10*3/uL (ref 0.0–0.1)
Basophils Relative: 0 %
Eosinophils Absolute: 0.1 10*3/uL (ref 0.0–0.7)
Eosinophils Relative: 1 %
HCT: 35.3 % — ABNORMAL LOW (ref 36.0–46.0)
Hemoglobin: 11.3 g/dL — ABNORMAL LOW (ref 12.0–15.0)
Lymphocytes Relative: 18 %
Lymphs Abs: 1.8 10*3/uL (ref 0.7–4.0)
MCH: 25.8 pg — ABNORMAL LOW (ref 26.0–34.0)
MCHC: 32 g/dL (ref 30.0–36.0)
MCV: 80.6 fL (ref 78.0–100.0)
Monocytes Absolute: 0.5 10*3/uL (ref 0.1–1.0)
Monocytes Relative: 5 %
Neutro Abs: 7.4 10*3/uL (ref 1.7–7.7)
Neutrophils Relative %: 76 %
Platelets: 354 10*3/uL (ref 150–400)
RBC: 4.38 MIL/uL (ref 3.87–5.11)
RDW: 18 % — ABNORMAL HIGH (ref 11.5–15.5)
WBC: 9.8 10*3/uL (ref 4.0–10.5)

## 2016-09-01 MED ORDER — IOPAMIDOL (ISOVUE-300) INJECTION 61%
INTRAVENOUS | Status: AC
Start: 2016-09-01 — End: 2016-09-01
  Administered 2016-09-01: 100 mL
  Filled 2016-09-01: qty 100

## 2016-09-01 MED ORDER — MORPHINE SULFATE (PF) 4 MG/ML IV SOLN
4.0000 mg | Freq: Once | INTRAVENOUS | Status: AC
  Administered 2016-09-01: 4 mg via INTRAVENOUS
  Filled 2016-09-01: qty 1

## 2016-09-01 MED ORDER — HYDROCODONE-ACETAMINOPHEN 5-325 MG PO TABS: 1.0000 | ORAL_TABLET | Freq: Four times a day (QID) | ORAL | 0 refills | Status: DC | PRN

## 2016-09-01 MED ORDER — POTASSIUM CHLORIDE CRYS ER 20 MEQ PO TBCR
40.0000 meq | EXTENDED_RELEASE_TABLET | Freq: Once | ORAL | Status: AC
  Administered 2016-09-01: 40 meq via ORAL
  Filled 2016-09-01: qty 2

## 2016-09-01 NOTE — Discharge Instructions (Signed)
Take Tylenol for mild pain or the pain medicine prescribed for bad pain. Don't take Tylenol together with the pain medicine prescribed as the combination can be dangerous to your liver. Keep your scheduled appointment to get your INR (Coumadin level )rechecked. Today's INR. You have a nodule on your thyroid gland. Please ask Dr.Avbuere to order an ultrasound of your thyroid gland as an outpatient. You also have a mass on your right adrenal gland which is slightly larger than 2013.Dr Jeanie Cooks may want to recheck that in several months. Please tell him about today's visit here. Your blood potassium today was 2.8, slightly low. You've were given potassium while here. Blood sugar was mildly elevated at 161. Please share all these results with Dr.Avbuere. Return if concern for any reason

## 2016-09-01 NOTE — ED Notes (Signed)
Pt verbalized understanding of d/c instructions and has no further questions. Pt is stable, A&Ox4, VSS.  
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
## 2016-09-04 ENCOUNTER — Other Ambulatory Visit (INDEPENDENT_AMBULATORY_CARE_PROVIDER_SITE_OTHER): Payer: Self-pay | Admitting: Orthopedic Surgery

## 2016-09-04 NOTE — Telephone Encounter (Signed)
Called to pharmacy 

## 2016-09-04 NOTE — Telephone Encounter (Signed)
y

## 2016-09-04 NOTE — Telephone Encounter (Signed)
Ok to rf? 

## 2016-09-06 ENCOUNTER — Telehealth: Payer: Self-pay | Admitting: *Deleted

## 2016-09-06 ENCOUNTER — Other Ambulatory Visit (HOSPITAL_BASED_OUTPATIENT_CLINIC_OR_DEPARTMENT_OTHER): Payer: Medicare Other

## 2016-09-06 DIAGNOSIS — Z7901 Long term (current) use of anticoagulants: Secondary | ICD-10-CM | POA: Diagnosis not present

## 2016-09-06 DIAGNOSIS — Z86718 Personal history of other venous thrombosis and embolism: Secondary | ICD-10-CM

## 2016-09-06 DIAGNOSIS — I82403 Acute embolism and thrombosis of unspecified deep veins of lower extremity, bilateral: Secondary | ICD-10-CM

## 2016-09-06 LAB — PROTIME-INR
INR: 3.6 — ABNORMAL HIGH (ref 2.00–3.50)
Protime: 43.2 Seconds — ABNORMAL HIGH (ref 10.6–13.4)

## 2016-09-06 NOTE — Telephone Encounter (Signed)
Call placed to patient to notify her per order of Dr. Benay Spice to hold Coumadin on 09/06/16, to change dose to 5 mg starting on 09/07/16,  PT will be repeated in one week and that scheduling will call her to schedule appt.  Patient states that she is not an antibiotics at this time and is appreciative of call.  Teach back done.

## 2016-09-07 ENCOUNTER — Ambulatory Visit (INDEPENDENT_AMBULATORY_CARE_PROVIDER_SITE_OTHER): Payer: Medicare Other

## 2016-09-07 ENCOUNTER — Ambulatory Visit (INDEPENDENT_AMBULATORY_CARE_PROVIDER_SITE_OTHER): Payer: Medicare Other | Admitting: Orthopedic Surgery

## 2016-09-07 ENCOUNTER — Encounter (INDEPENDENT_AMBULATORY_CARE_PROVIDER_SITE_OTHER): Payer: Self-pay | Admitting: Orthopedic Surgery

## 2016-09-07 DIAGNOSIS — Z96653 Presence of artificial knee joint, bilateral: Secondary | ICD-10-CM
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
## 2016-09-11 ENCOUNTER — Other Ambulatory Visit: Payer: Self-pay | Admitting: Oncology

## 2016-09-11 ENCOUNTER — Telehealth: Payer: Self-pay | Admitting: Oncology

## 2016-09-11 NOTE — Telephone Encounter (Signed)
sw pt to inform of lab appt 6/27 at 115 per sch msg. Pt stated she will return call to confirm due to transportation issues

## 2016-09-13 ENCOUNTER — Other Ambulatory Visit: Payer: Medicare Other

## 2016-09-14 ENCOUNTER — Ambulatory Visit: Payer: Medicare Other | Attending: Internal Medicine | Admitting: Physical Therapy

## 2016-09-14 ENCOUNTER — Encounter: Payer: Self-pay | Admitting: Physical Therapy

## 2016-09-14 DIAGNOSIS — M6281 Muscle weakness (generalized): Secondary | ICD-10-CM

## 2016-09-14 DIAGNOSIS — M25561 Pain in right knee: Secondary | ICD-10-CM | POA: Diagnosis present

## 2016-09-14 DIAGNOSIS — R293 Abnormal posture: Secondary | ICD-10-CM

## 2016-09-14 DIAGNOSIS — R262 Difficulty in walking, not elsewhere classified: Secondary | ICD-10-CM | POA: Diagnosis present

## 2016-09-14 DIAGNOSIS — M25562 Pain in left knee: Secondary | ICD-10-CM | POA: Insufficient documentation

## 2016-09-14 DIAGNOSIS — M5440 Lumbago with sciatica, unspecified side: Secondary | ICD-10-CM | POA: Diagnosis present

## 2016-09-14 IMAGING — MR MR HIP*R* W/O CM
4 of 5 series · 15 of 40 positions shown · non-contrast
Comparison: None.

CLINICAL DATA: Chronic RIGHT hip pain. Hip pain for 10 years.
Femoral hernia.

EXAM:
MR OF THE RIGHT HIP WITHOUT CONTRAST
TECHNIQUE: Multiplanar, multisequence MR imaging was performed. No intravenous
contrast was administered.

[Series 3: T1 · coronal · 5.0mm · 0.56mm/px · 3 of 18 slices shown (1 of 2)]
[im 3/18]
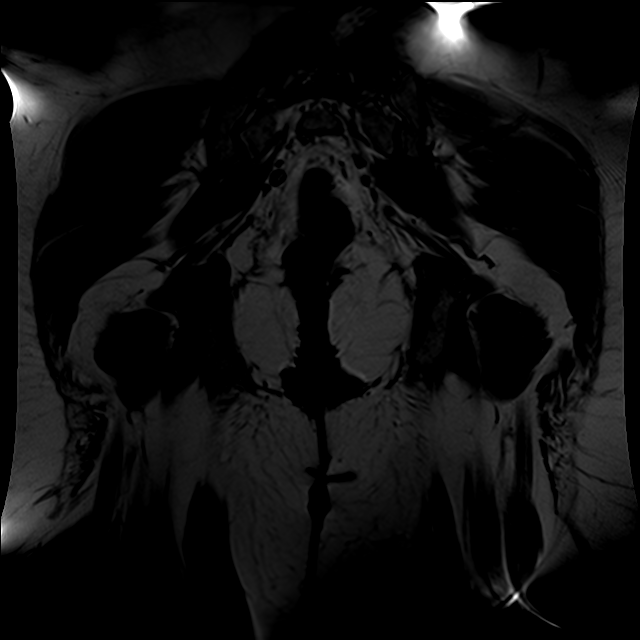
[im 9/18]
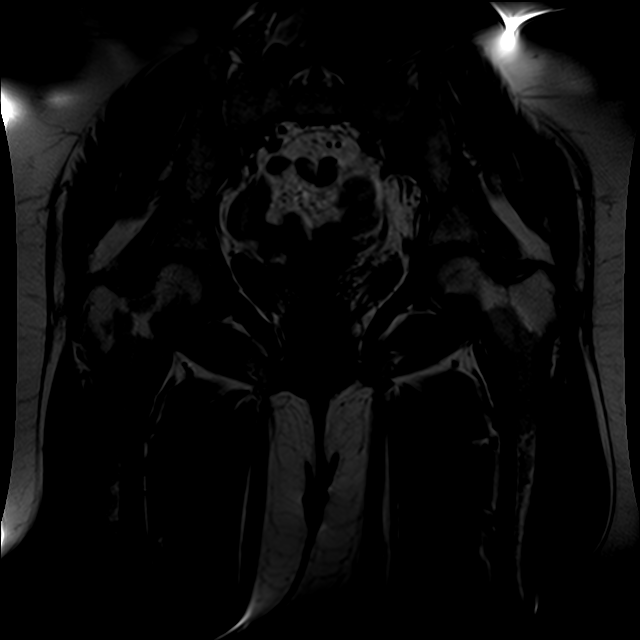
[im 15/18]
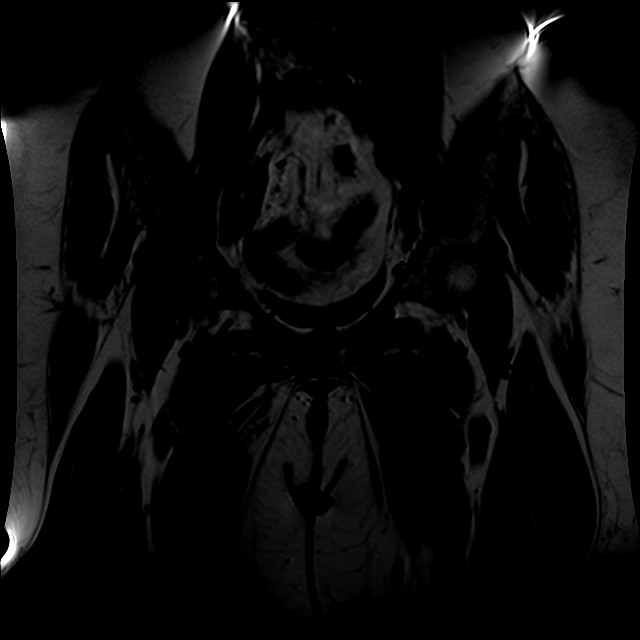

[Series 5: T2 fat-sat · axial · 4.0mm · 0.56mm/px · z∈[-21,+65]mm · 3 of 24 slices shown]
[im 3/24]
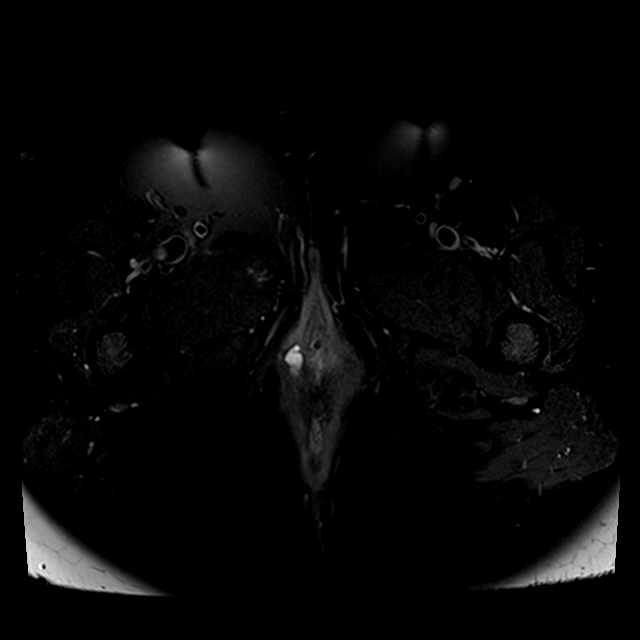
[im 12/24]
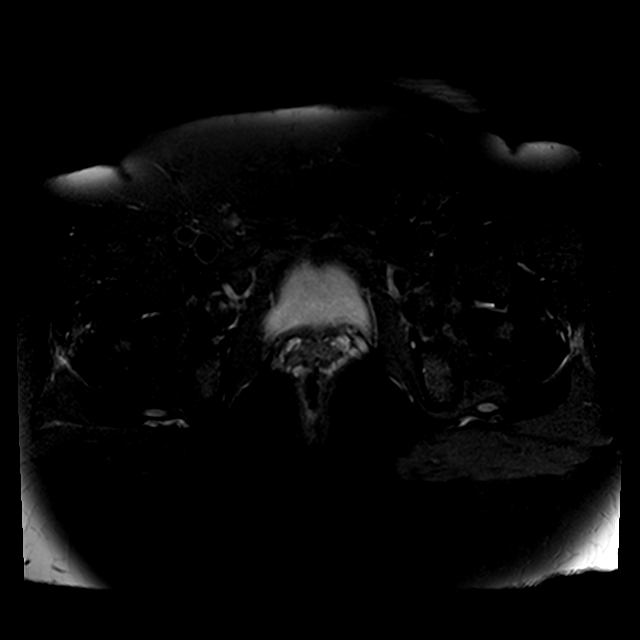
[im 21/24]
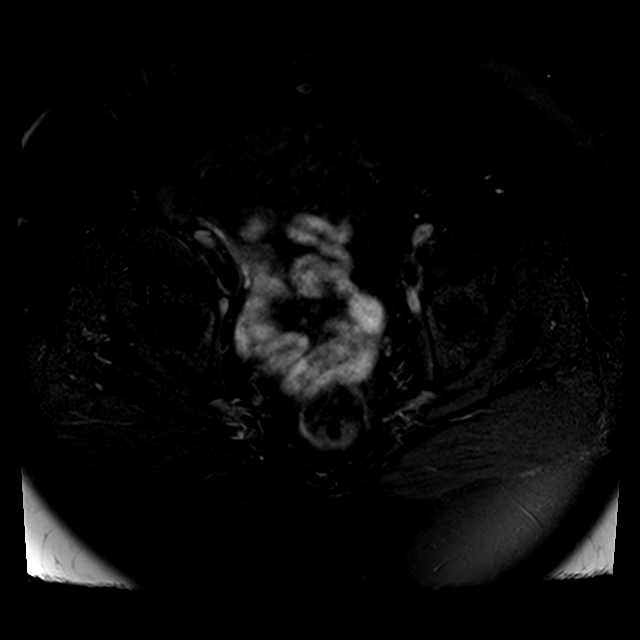

[Series 6: T1 · axial · 4.0mm · 0.56mm/px · z∈[-21,+65]mm · 3 of 24 slices shown (2 of 2)]
[im 3/24]
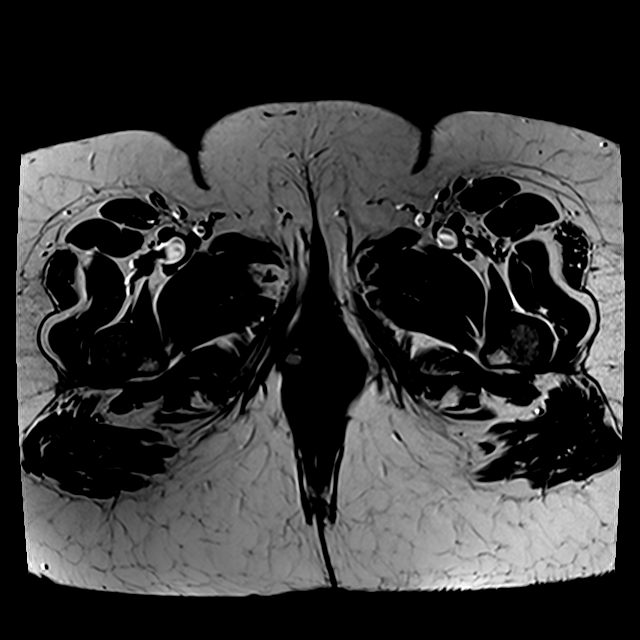
[im 12/24]
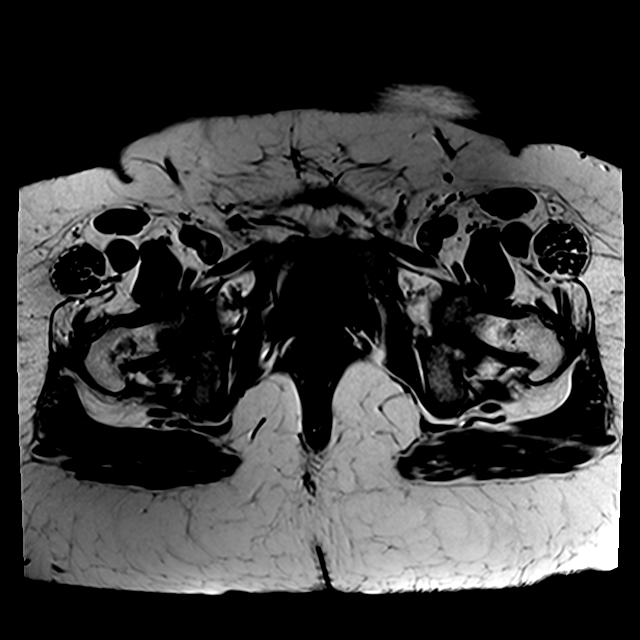
[im 21/24]
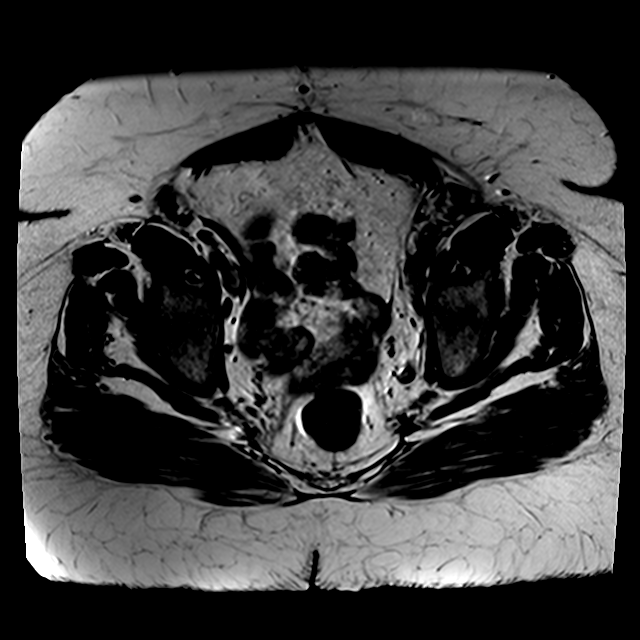

[Series 7: PD fat-sat · sagittal · 4.0mm · 0.35mm/px · 6 of 22 slices shown]
[im 1/22]
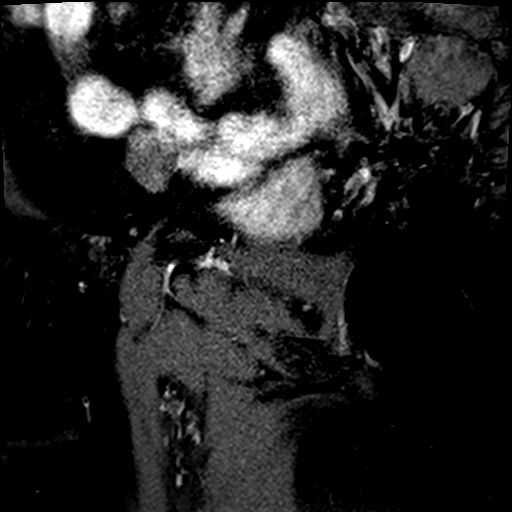
[im 4/22]
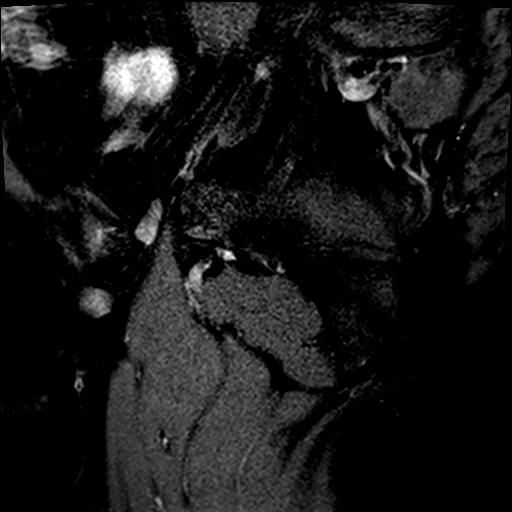
[im 7/22]
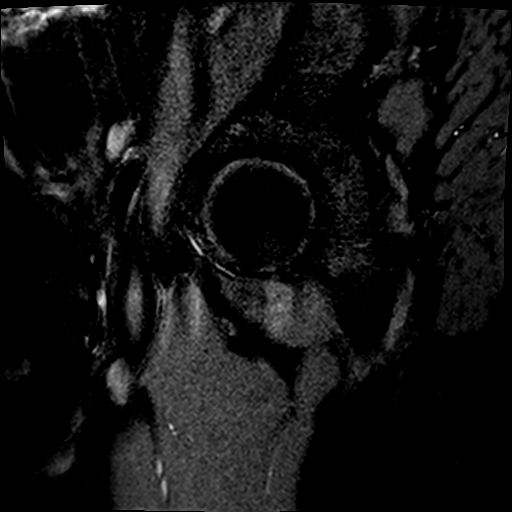
[im 10/22]
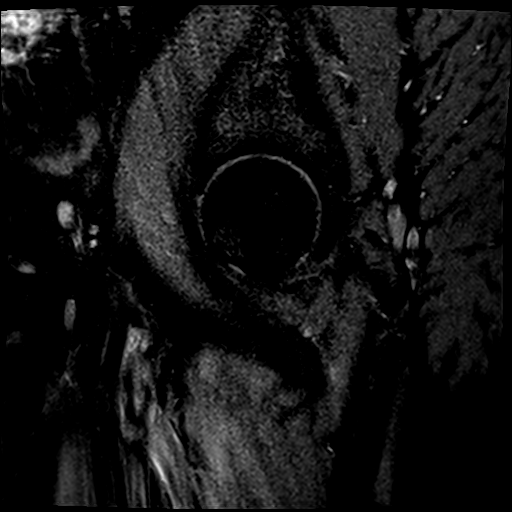
[im 13/22]
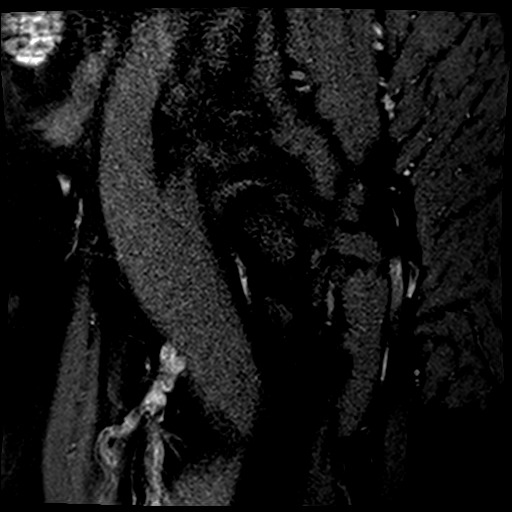
[im 19/22]
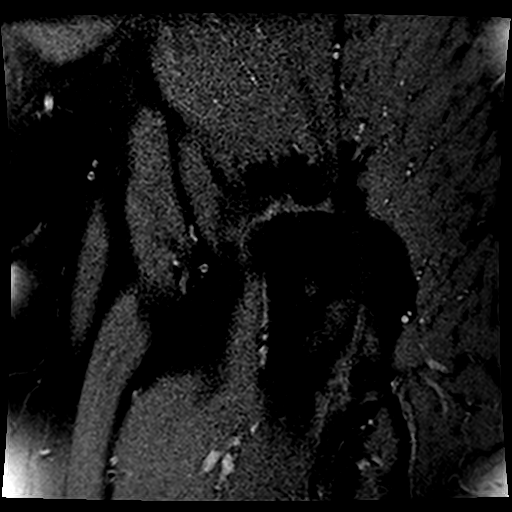

[15 of 40 positions shown; findings below may reference images not displayed]

FINDINGS: Bones: Bone marrow signal shows suppression of normal fatty marrow.
This is a nonspecific finding most commonly associated with obesity,
anemia, cigarette smoking or chronic disease. In this patient, the
abnormal marrow signal is likely obesity related. Negative for AVN.

Articular cartilage and labrum

Articular cartilage:  Normal.

Labrum:  Normal.

Joint or bursal effusion

Joint effusion:  None.

Bursae:  Normal.

Muscles and tendons

Muscles and tendons: Gluteal muscles appear normal and symmetric.
Hip girdle musculature also appears normal. Common hamstring origins
normal.

Other findings

Miscellaneous: There is no femoral hernia identified. Bartholin
cysts are present on the RIGHT. Lumbar spine not visible due to
artifact at the deep edge of the field.
IMPRESSION: 1. Negative RIGHT hip MRI. No osteoarthritis or paralabral cyst to
suggest labral tear.
2. Negative for femoral hernia.
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
## 2016-09-14 NOTE — Patient Instructions (Signed)
   Copyright  VHI. All rights reserved.  HIP: Flexion / KNEE: Extension, Straight Leg Raise   Raise leg, keeping knee straight. Perform slowly. 15___ reps per set, 2___ sets per day, _7__ days per week  Both legs   Copyright  VHI. All rights reserved.  Heel Slide   Bend knee and pull heel toward buttocks. Hold _3___ seconds. Return. Repeat with other knee. Repeat _15___ times. Do _1-2___ sessions per day. Both legs  http://[email or web address].exer.[email or web address]   Copyright  VHI. All rights reserved.     Raise leg until knee is straight. 15___ reps per set, _2__ sets per day, _7__ days per week both legs  Copyright  VHI. All rights reserved.  Short Arc [employer] a large can or rolled towel under leg. Straighten knee and leg. Hold __5__ seconds. Repeat with other leg. 15 x each leg  Repeat ____ times. Do ____ sessions per day.  http://[email or web address].exer.us/365   Copyright  VHI. All rights reserved.  Quad Set   Slowly tighten muscles on thigh of straight leg while counting out loud to _5___. Repeat with other leg. Repeat __20__ times. Do ___3_ sessions per day.  Laila, please do this [place] set exercise during commercials on TV. :)  http://[email or web address].exer.us/361   Copyright  VHI. All rights reserved.  Voncille Lo, PT Certified Exercise Expert for the Aging Adult  09/14/16 10:06 AM Phone: [phone] Fax: [phone]

## 2016-09-18 ENCOUNTER — Telehealth: Payer: Self-pay | Admitting: *Deleted

## 2016-09-18 ENCOUNTER — Other Ambulatory Visit (HOSPITAL_BASED_OUTPATIENT_CLINIC_OR_DEPARTMENT_OTHER): Payer: Medicare Other

## 2016-09-18 DIAGNOSIS — Z86718 Personal history of other venous thrombosis and embolism: Secondary | ICD-10-CM

## 2016-09-18 DIAGNOSIS — Z7901 Long term (current) use of anticoagulants: Secondary | ICD-10-CM

## 2016-09-18 DIAGNOSIS — I82403 Acute embolism and thrombosis of unspecified deep veins of lower extremity, bilateral: Secondary | ICD-10-CM

## 2016-09-18 LAB — PROTIME-INR
INR: 2 (ref 2.00–3.50)
Protime: 24 Seconds — ABNORMAL HIGH (ref 10.6–13.4)

## 2016-09-18 IMAGING — CR DG KNEE COMPLETE 4+V*R*
4 series · 4 of 4 positions shown · non-contrast
Comparison: 11/29/2010

CLINICAL DATA: Right leg pain.  Difficulty ambulating.  No injury.

EXAM:
RIGHT KNEE - COMPLETE 4+ VIEW

[t knee ap right]
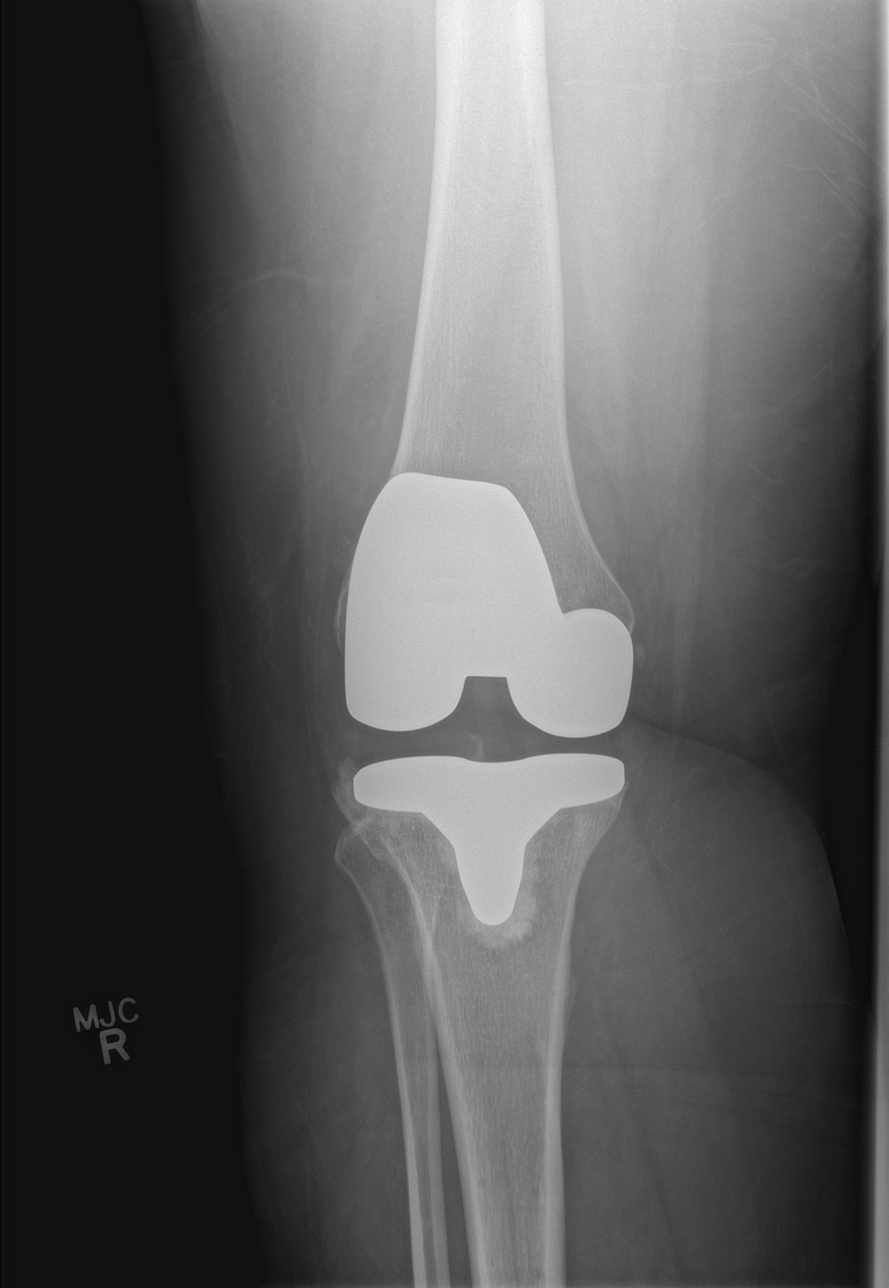

[t knee obl right (1 of 2)]
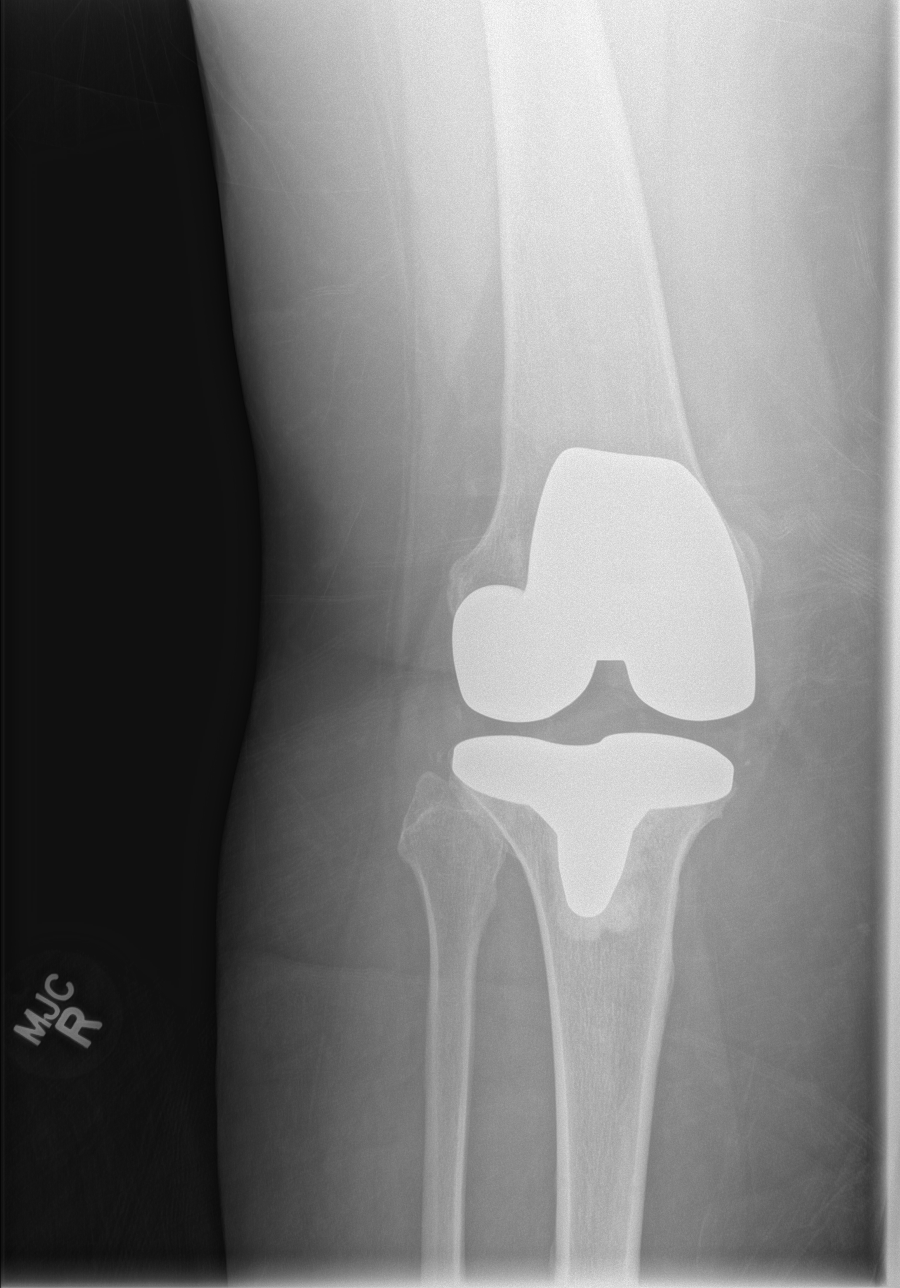

[t knee obl right (2 of 2)]
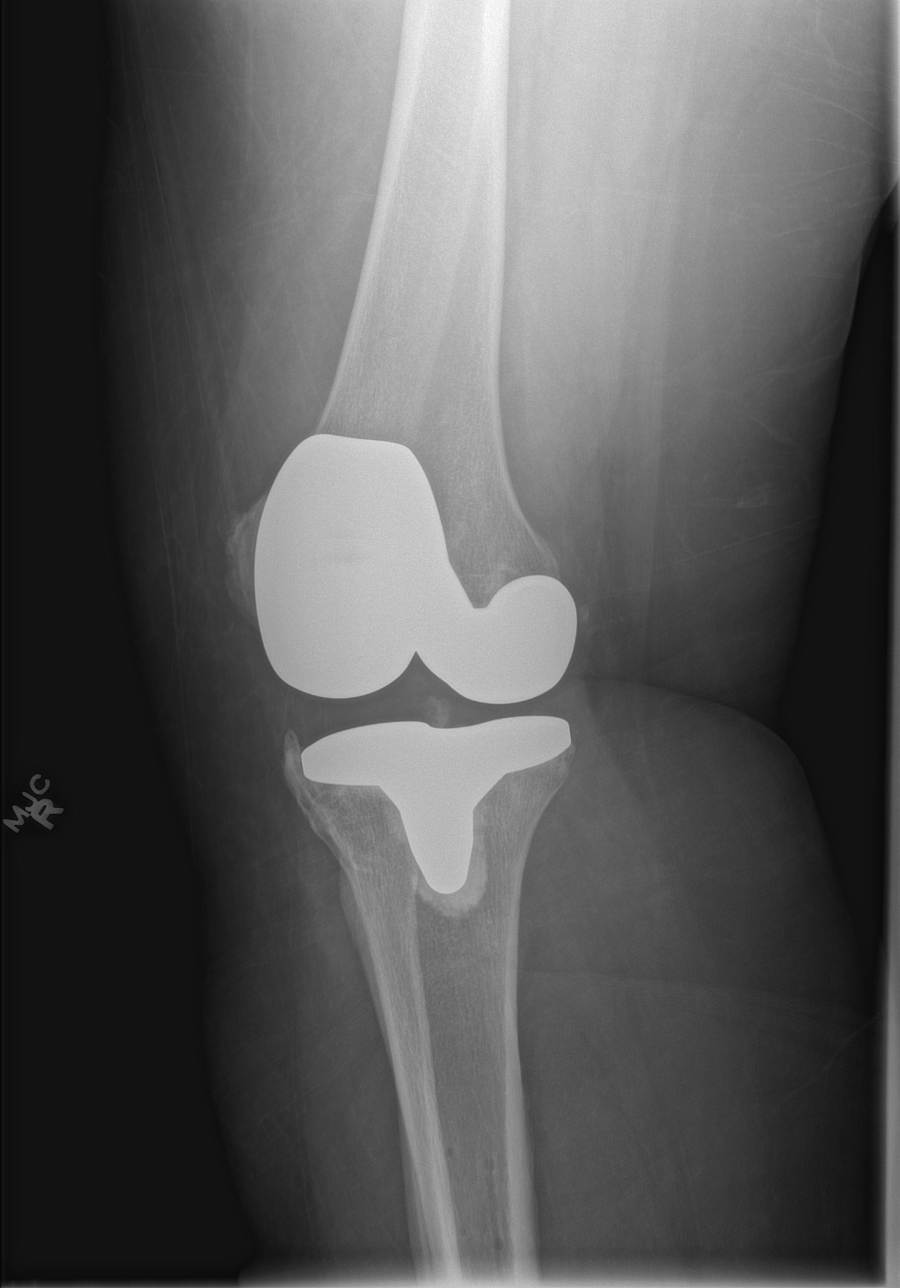

[t knee lat right]
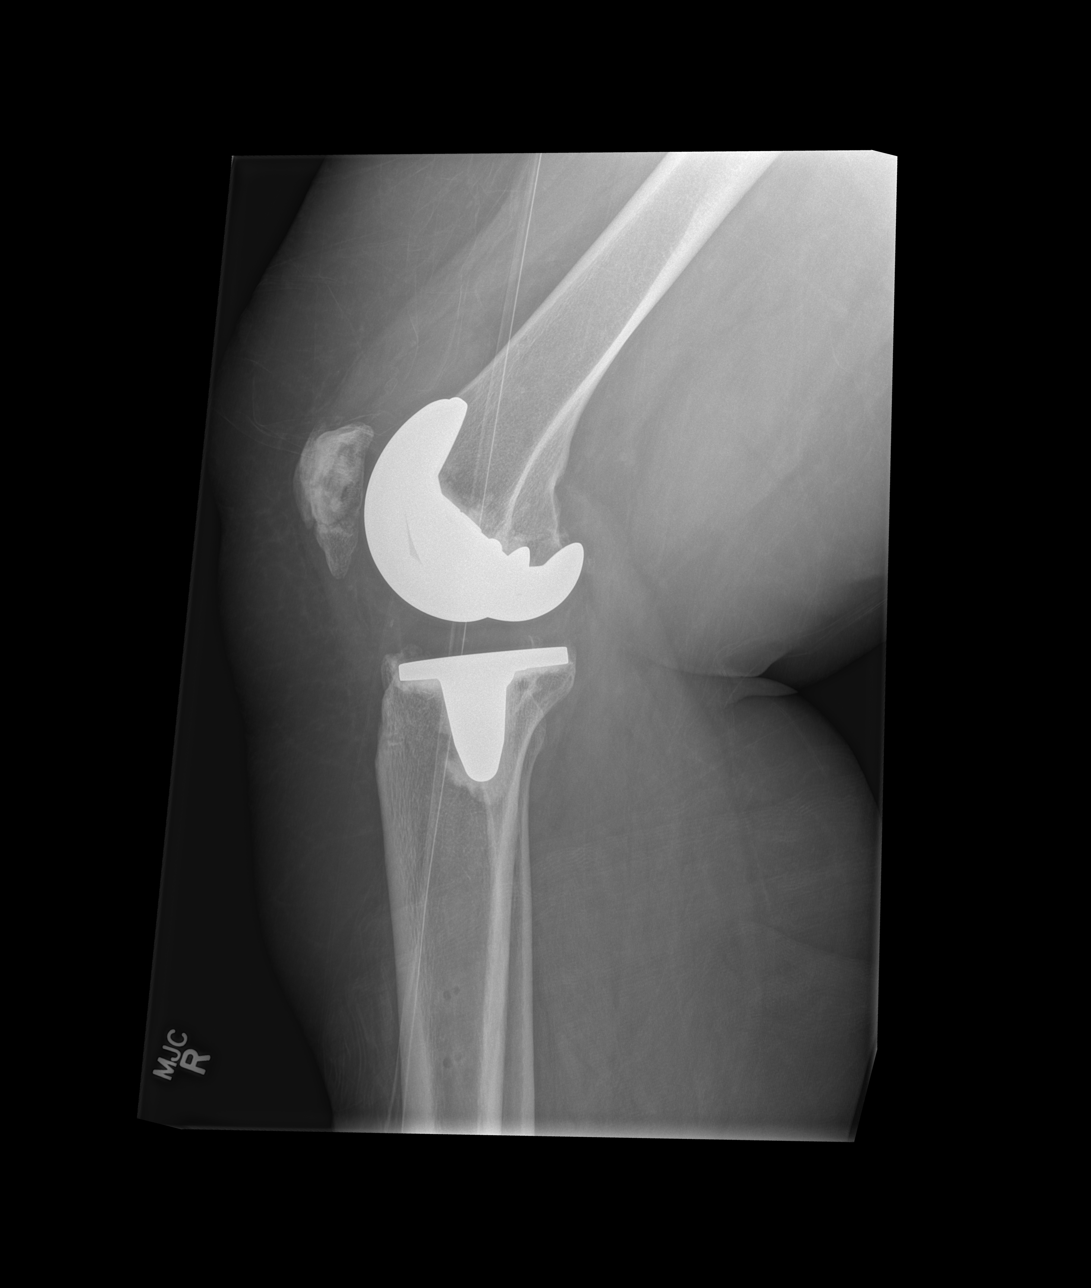

[4 of 4 positions shown; findings below may reference images not displayed]

FINDINGS: Right total knee arthroplasty. Components appear well seated. No
change in position of hardware since prior study. No significant
effusion. No acute fracture or dislocation. Postoperative contracts
in the proximal tibial shaft.
IMPRESSION: Right total knee arthroplasty. Components appear well seated. No
acute bony abnormalities identified.

## 2016-09-18 IMAGING — CR DG FEMUR 2+V*R*
4 series · 4 of 4 positions shown · non-contrast
Comparison: None.

CLINICAL DATA: Right leg pain. Pain began and above the knee this
afternoon.

EXAM:
DG FEMUR 2+V*R*

[t femur proximal ap right]
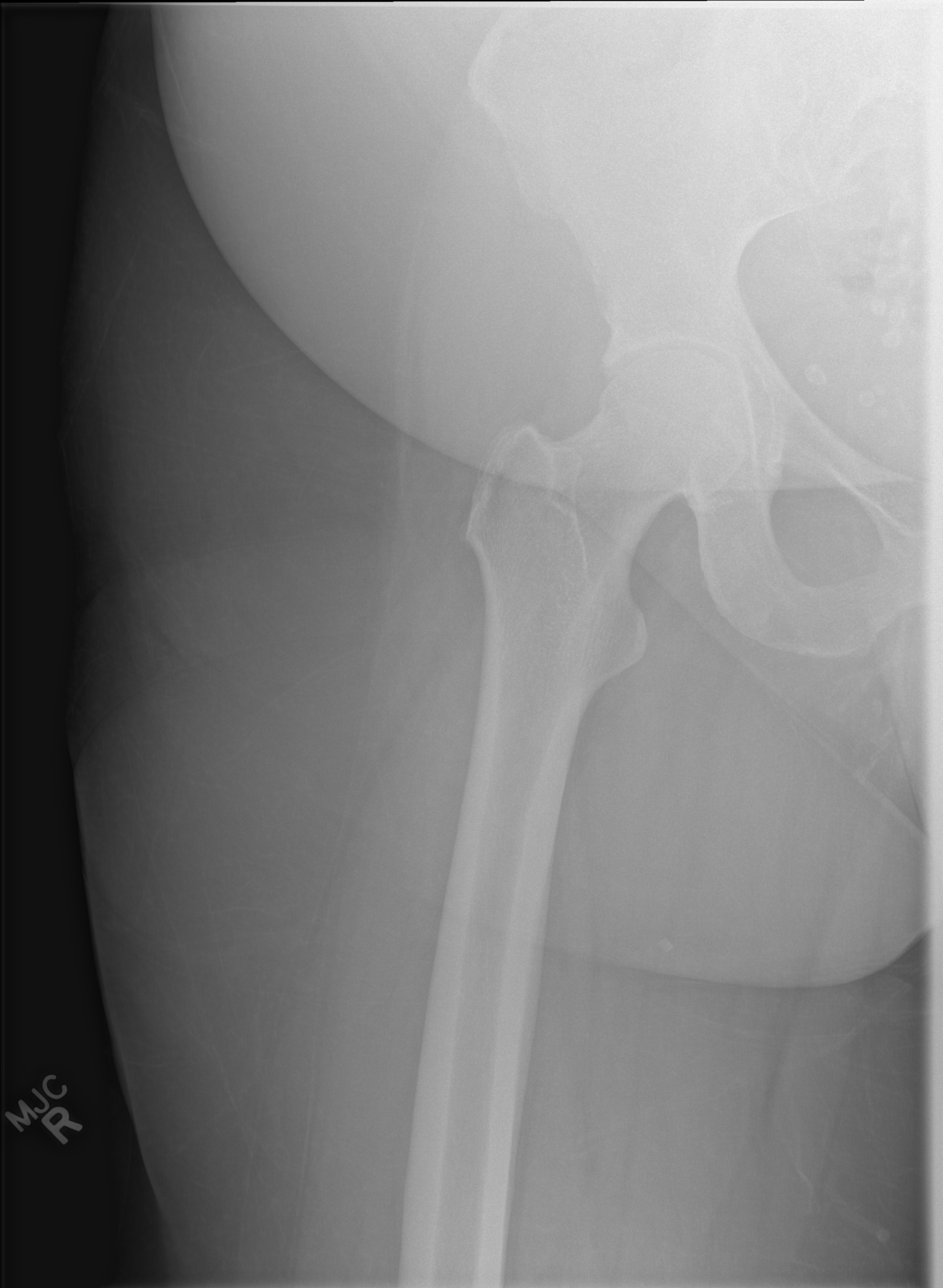

[t femur distal ap right]
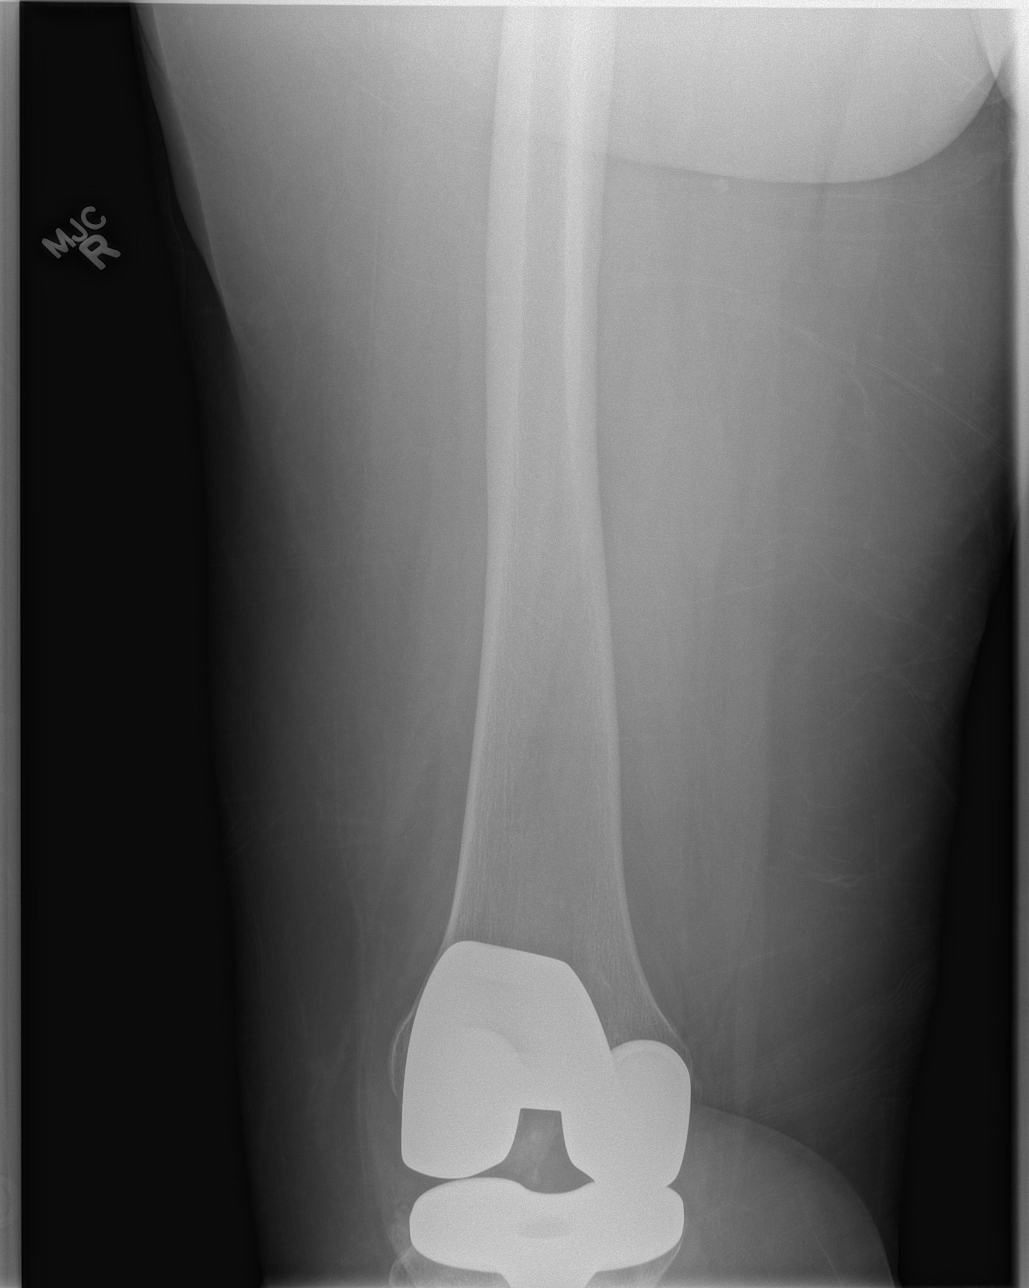

[t femur proximal lat right (1 of 2)]
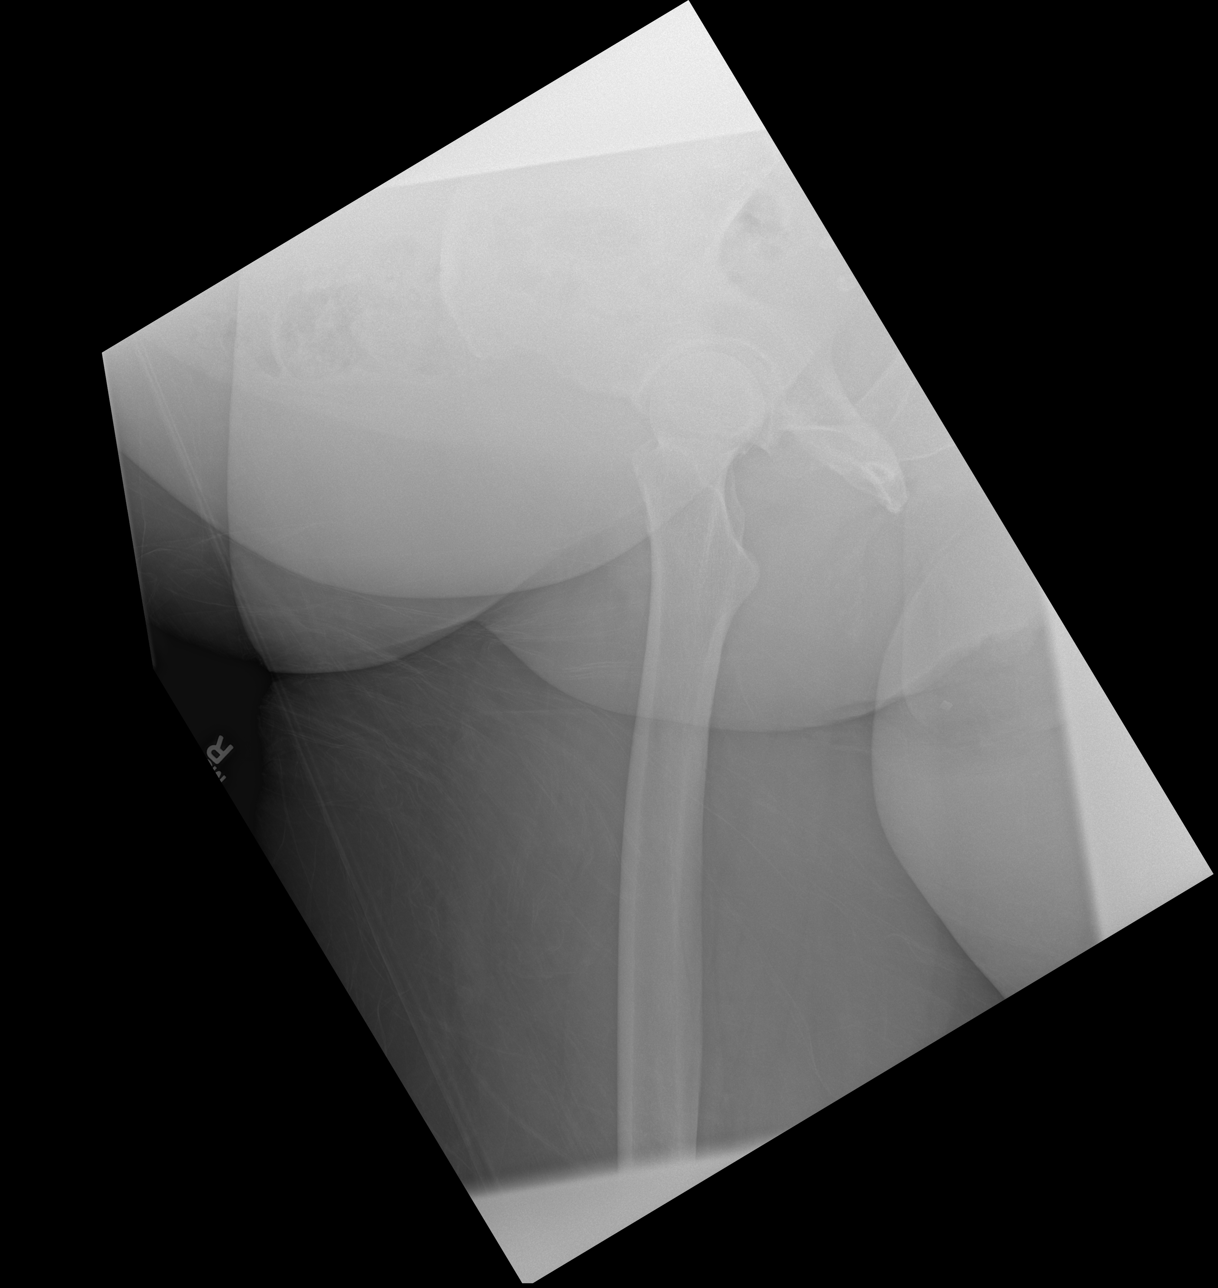

[t femur proximal lat right (2 of 2)]
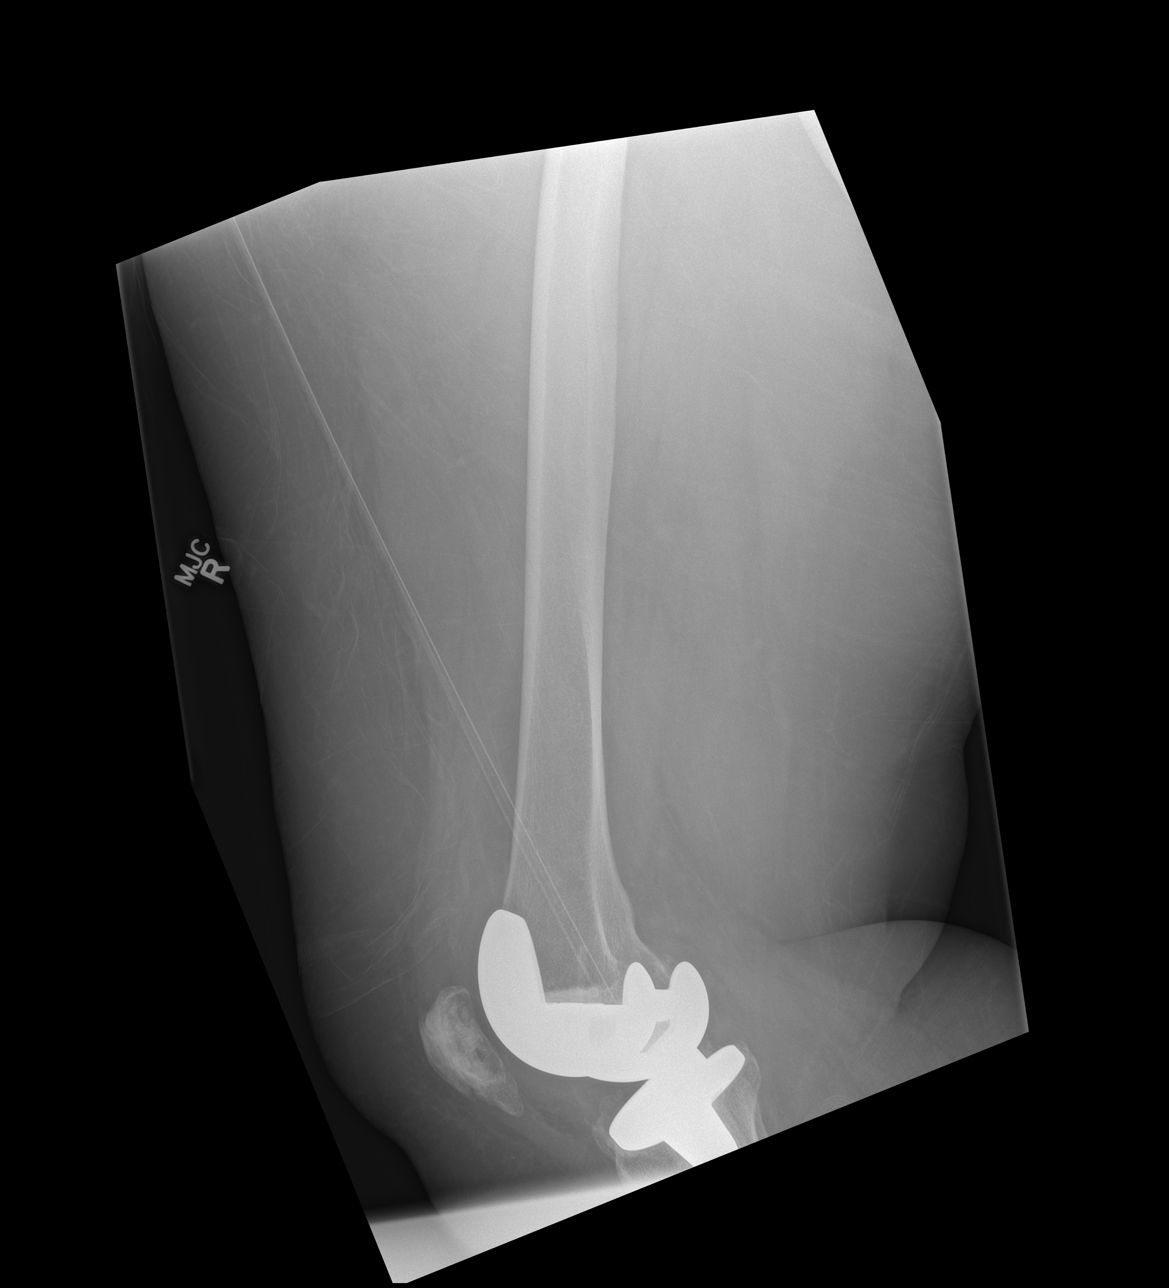

[4 of 4 positions shown; findings below may reference images not displayed]

FINDINGS: There is a total right knee arthroplasty. The knee joint and
proximal hip joint appear proper normal. No evidence of fracture or
dislocation.
IMPRESSION: No acute findings of the right femur.

## 2016-09-18 NOTE — Telephone Encounter (Signed)
Patient called with lab results. Per Dr. Benay Spice patient is to continue 5mg  Coumadin daily. Patient is to return for recheck in three weeks. Appt scheduled for 7/23 at 9:45 pt confirms appt.

## 2016-09-21 ENCOUNTER — Ambulatory Visit: Payer: Medicare Other | Attending: Internal Medicine | Admitting: Rehabilitative and Restorative Service Providers"

## 2016-09-21 DIAGNOSIS — M5440 Lumbago with sciatica, unspecified side: Secondary | ICD-10-CM | POA: Insufficient documentation

## 2016-09-21 DIAGNOSIS — R262 Difficulty in walking, not elsewhere classified: Secondary | ICD-10-CM | POA: Diagnosis present

## 2016-09-21 DIAGNOSIS — M25562 Pain in left knee: Secondary | ICD-10-CM | POA: Diagnosis present

## 2016-09-21 DIAGNOSIS — R293 Abnormal posture: Secondary | ICD-10-CM | POA: Insufficient documentation

## 2016-09-21 DIAGNOSIS — M6281 Muscle weakness (generalized): Secondary | ICD-10-CM | POA: Insufficient documentation

## 2016-09-21 DIAGNOSIS — M25561 Pain in right knee: Secondary | ICD-10-CM | POA: Insufficient documentation
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
## 2016-09-27 ENCOUNTER — Ambulatory Visit: Payer: Medicare Other | Admitting: Physical Therapy

## 2016-09-27 ENCOUNTER — Encounter: Payer: Self-pay | Admitting: Physical Therapy

## 2016-09-27 DIAGNOSIS — R293 Abnormal posture: Secondary | ICD-10-CM

## 2016-09-27 DIAGNOSIS — M25561 Pain in right knee: Secondary | ICD-10-CM

## 2016-09-27 DIAGNOSIS — M6281 Muscle weakness (generalized): Secondary | ICD-10-CM

## 2016-09-27 DIAGNOSIS — R262 Difficulty in walking, not elsewhere classified: Secondary | ICD-10-CM

## 2016-09-27 DIAGNOSIS — M25562 Pain in left knee: Secondary | ICD-10-CM | POA: Diagnosis not present

## 2016-09-27 DIAGNOSIS — M5440 Lumbago with sciatica, unspecified side: Secondary | ICD-10-CM

## 2016-09-27 NOTE — Patient Instructions (Signed)
Remove tape after a day.  Remove sooner if irritating

## 2016-09-27 NOTE — Therapy (Signed)
[place], [place], [address] Phone: [phone]   Fax:  [phone]  Physical Therapy Treatment  Patient Details  Name: [name] MRN: [id number] Date of Birth: [birth year] Referring Provider: Alphonzo Severance MD  Encounter Date: 09/27/2016      PT End of Session - 09/27/16 0851    Visit Number 3   Number of Visits 12   Date for PT Re-Evaluation 10/26/16   PT Start Time 0803   PT Stop Time 0900   PT Time Calculation (min) 57 min   Activity Tolerance Patient tolerated treatment well   Behavior During Therapy [facility] for tasks assessed/performed      Past Medical History:  Diagnosis Date  . Adrenal mass ([place]) 03/226   Benign  . Arthritis    knees/multiple orthopedic conditons; lower back  . Asthma    per pt  . 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 Depression   . Diabetes mellitus    120s usually fasting  . Dizziness, nonspecific   . DVT (deep venous thrombosis) (HCC)    Recurrent  . Fibromyalgia   . GERD (gastroesophageal reflux disease)   . Heart murmur   . History of blood transfusion   . History of cocaine abuse    Remote history   . Hypertension    takes meds daily  . Lupus Anticoagulant Positive   . On home oxygen therapy    at night  . Shortness of breath    exertion or lying flat  . Sleep apnea    2l of oxygen at night    Past Surgical History:  Procedure Laterality Date  . ABDOMINAL HYSTERECTOMY  1989   Fibroids  . ANTERIOR LUMBAR FUSION  01/03/2012   Procedure: ANTERIOR LUMBAR FUSION 1 LEVEL;  Surgeon: Winfield Cunas, MD;  Location: [facility];  Service: Neurosurgery;  Laterality: N/A;  Lumbar Four-Five Anterior Lumbar Interbody Fusion with Instrumentation  . BACK SURGERY     cervical spine---disk disease  . CARPAL TUNNEL RELEASE     Bilateral  . CESAREAN  SECTION    . CYSTOSCOPY/RETROGRADE/URETEROSCOPY/STONE EXTRACTION WITH BASKET Right 04/26/2014   Procedure: CYSTOSCOPY/ RIGHT RETROGRADE/ RIGHT URETEROSCOPY/URETERAL AND RENAL PELVIS BIOPSY;  Surgeon: Alexis Frock, MD;  Location: [facility];  Service: Urology;  Laterality: Right;  . gallstones removed    . 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   bilateral  . right elbow surgery    . TUBAL LIGATION      There were no vitals filed for this visit.      Subjective Assessment - 09/27/16 0811    Subjective Back is a 10/10.  i almost did not come,  my knees are a 8/10 left worse than right.   Currently in Pain? Yes   Pain Score 8    Pain Location Knee   Pain Orientation Right;Left   Pain Descriptors / Indicators Aching   Pain Frequency Constant   Aggravating Factors  standing sleeping   Pain Relieving Factors meds   Pain Score 10   Pain Orientation Right   Pain Descriptors / Indicators --  hurting constant   Pain Radiating Towards No   Pain Frequency Constant   Aggravating Factors  all things   Pain Relieving Factors toss and turns to change position  [facility] Adult PT Treatment/Exercise - 09/27/16 0001      Knee/Hip Exercises: Stretches   Passive Hamstring Stretch 3 reps;20 seconds   Passive Hamstring Stretch Limitations light stretch,  care taken to avoid increased back pain     Knee/Hip Exercises: Supine   Quad Sets 1 set;10 reps   Short Arc Quad Sets Both;Strengthening   Short Arc Quad Sets Limitations 0 LBS, 10 X   Heel Slides 1 set;10 reps;AAROM     Manual Therapy   Manual Therapy Edema management;Passive ROM;Other (comment)   Manual therapy comments taping to prevent lateral tracking on left knee   Edema Management both knees retrograde   Passive ROM both knees   Other Manual Therapy soft tissue both thighs/knees                PT Education - 09/27/16 0851    Education provided Yes   Education Details How to decrease edema    Person(s) Educated Patient   Methods Explanation;Demonstration;Verbal cues   Comprehension Verbalized understanding          PT Short Term Goals - 09/21/16 0822      PT SHORT TERM [place] #1   Title STG=LTG           PT Long Term Goals - 09/21/16 3710      PT LONG TERM [place] #1   Title Pt will be independent with advance HEP 10-26-16   Period Weeks   Status On-going     PT LONG TERM [place] #2   Title bil  knee AROM flexion will improve to 2-120degrees for improved mobility to ride in car without increased pain 10-26-16   Time 6   Period Weeks   Status On-going     PT LONG TERM [place] #3   Title Pt will be able to stand for up to 1 hour in order to complete shopping in store without exacerbation of pain in back or knees 10-26-16   Time 6   Period Weeks   Status On-going     PT LONG TERM [place] #4   Title "FOTO will improve from  61%limitation    to  46% limitation   indicating improved functional mobility. 10-26-16   Period Weeks   Status On-going     PT LONG TERM [place] #5   Title Pt will be able to sleep 4 or more hours at night without waking due to pain while turning in bed for more restorative sleelp 10-26-16   Time 6   Period Weeks   Status On-going     PT LONG TERM [place] #6   Title Pt will be able to ambulate at 2.62 ft/sec for community ambulation 10-26-16   Time 6   Period Weeks   Status On-going               Plan - 09/27/16 1805    PT Treatment/Interventions ADLs/Self Care Home Management;Cryotherapy;Electrical Stimulation;Iontophoresis 4mg /ml Dexamethasone;Moist Heat;Ultrasound;Gait training;Stair training;Functional mobility training;Patient/family education;Neuromuscular re-education;Therapeutic exercise;Therapeutic activities;Manual techniques;Passive range of motion;Vasopneumatic Device;Taping;Dry needling   PT Next Visit Plan continue to progress knee strengthening in supine, continue to add on hip/core strengthening   PT Home Exercise Plan Level 1 knee  quad set, SAQ LAQ  SLR   Consulted and Agree with Plan of Care Patient      Patient will benefit from skilled therapeutic intervention in order to improve the following deficits and impairments:  Decreased activity tolerance, Decreased mobility, Decreased range of motion, Decreased strength, Difficulty walking,  Obesity, Pain, Improper body mechanics, Postural dysfunction, Increased edema, Increased fascial restricitons, Increased muscle spasms  Visit Diagnosis: Acute pain of left knee  Acute pain of right knee  Difficulty in walking, not elsewhere classified  Muscle weakness (generalized)  Acute bilateral low back pain with sciatica, sciatica laterality unspecified  Abnormal posture     Problem List Patient Active Problem List   Diagnosis Date Noted  . Chronic pain of right knee 05/24/2016  . Effusion, right knee 04/28/2016  . Presence of right artificial knee joint 04/28/2016  . Abnormal LFTs   . Decreased sensation   . Paresthesia 06/12/2014  . Obstruction of kidney 04/24/2014  . Diabetes type 2, uncontrolled ([place]) 04/24/2014  . Chronic anticoagulation 04/24/2014  . Crack cocaine use 04/24/2014  . Depression 04/24/2014  . Fibromyalgia 04/24/2014  . COPD (chronic obstructive pulmonary disease) ([place]) 04/24/2014  . Obstructive sleep apnea on CPAP 04/24/2014  . Lupus anticoagulant positive 04/24/2014  . Adrenal mass, right ([place]) 04/24/2014  . Right kidney mass 04/24/2014  . Hydronephrosis of right kidney 04/24/2014  . Supratherapeutic INR 04/24/2014  . Renal mass, right 04/24/2014  . Diabetes type 2, controlled ([place])   . Right lower quadrant abdominal pain   . Morbid obesity ([place]) 08/21/2013  . Unspecified constipation 08/19/2013  . COPD exacerbation ([place]) 08/17/2013  . Morbid obesity with BMI of 45.0-49.9, adult ([place]) 01/05/2012  . Bilateral deep vein thromboses ([place]) 12/13/2011  . Abdominal pain 11/05/2011  . Hypokalemia 11/05/2011  . Sciatica 11/05/2011  .  Type II or unspecified type diabetes mellitus without mention of complication, not stated as uncontrolled 11/05/2011  . HTN (hypertension) 11/05/2011    HARRIS,KAREN PTA 09/27/2016, 6:07 PM  [place], [place], [address] Phone: [phone]   Fax:  [phone]  Name: Shamara Soza Wartman MRN: [id number] Date of Birth: January 05, 1956

## 2016-09-28 ENCOUNTER — Ambulatory Visit: Payer: Medicare Other | Admitting: Physical Therapy

## 2016-09-29 ENCOUNTER — Emergency Department (HOSPITAL_COMMUNITY)
Admission: EM | Admit: 2016-09-29 | Discharge: 2016-09-29 | Disposition: A | Discharge status: Home / Self Care | Payer: Medicare Other | Attending: Emergency Medicine | Admitting: Emergency Medicine

## 2016-09-29 ENCOUNTER — Encounter (HOSPITAL_COMMUNITY): Payer: Self-pay | Admitting: Emergency Medicine

## 2016-09-29 DIAGNOSIS — Z7901 Long term (current) use of anticoagulants: Secondary | ICD-10-CM | POA: Diagnosis not present

## 2016-09-29 DIAGNOSIS — Z79899 Other long term (current) drug therapy: Secondary | ICD-10-CM | POA: Insufficient documentation

## 2016-09-29 DIAGNOSIS — I10 Essential (primary) hypertension: Secondary | ICD-10-CM | POA: Diagnosis not present

## 2016-09-29 DIAGNOSIS — J449 Chronic obstructive pulmonary disease, unspecified: Secondary | ICD-10-CM | POA: Insufficient documentation

## 2016-09-29 DIAGNOSIS — R600 Localized edema: Secondary | ICD-10-CM | POA: Diagnosis not present

## 2016-09-29 DIAGNOSIS — R42 Dizziness and giddiness: Secondary | ICD-10-CM

## 2016-09-29 DIAGNOSIS — Z87891 Personal history of nicotine dependence: Secondary | ICD-10-CM | POA: Insufficient documentation

## 2016-09-29 DIAGNOSIS — E119 Type 2 diabetes mellitus without complications: Secondary | ICD-10-CM | POA: Diagnosis not present

## 2016-09-29 DIAGNOSIS — R5383 Other fatigue: Secondary | ICD-10-CM | POA: Diagnosis present

## 2016-09-29 DIAGNOSIS — Z7984 Long term (current) use of oral hypoglycemic drugs: Secondary | ICD-10-CM | POA: Diagnosis not present

## 2016-09-29 DIAGNOSIS — J45909 Unspecified asthma, uncomplicated: Secondary | ICD-10-CM | POA: Insufficient documentation

## 2016-09-29 LAB — BASIC METABOLIC PANEL
Anion gap: 11 (ref 5–15)
BUN: 10 mg/dL (ref 6–20)
CO2: 26 mmol/L (ref 22–32)
Calcium: 9.1 mg/dL (ref 8.9–10.3)
Chloride: 99 mmol/L — ABNORMAL LOW (ref 101–111)
Creatinine, Ser: 0.77 mg/dL (ref 0.44–1.00)
GFR calc Af Amer: >60 mL/min (ref >60)
GFR calc non Af Amer: >60 mL/min (ref >60)
Glucose, Bld: 128 mg/dL — ABNORMAL HIGH (ref 65–99)
Potassium: 3.7 mmol/L (ref 3.5–5.1)
Sodium: 136 mmol/L (ref 135–145)

## 2016-09-29 LAB — CBC
HCT: 34.7 % — ABNORMAL LOW (ref 36.0–46.0)
Hemoglobin: 11.1 g/dL — ABNORMAL LOW (ref 12.0–15.0)
MCH: 25.7 pg — ABNORMAL LOW (ref 26.0–34.0)
MCHC: 32 g/dL (ref 30.0–36.0)
MCV: 80.3 fL (ref 78.0–100.0)
Platelets: 325 10*3/uL (ref 150–400)
RBC: 4.32 MIL/uL (ref 3.87–5.11)
RDW: 17.8 % — ABNORMAL HIGH (ref 11.5–15.5)
WBC: 6.3 10*3/uL (ref 4.0–10.5)

## 2016-09-29 LAB — URINALYSIS, ROUTINE W REFLEX MICROSCOPIC
Bilirubin Urine: NEGATIVE
Glucose, UA: >=500 mg/dL — AB
Ketones, ur: NEGATIVE mg/dL
Leukocytes, UA: NEGATIVE
Nitrite: NEGATIVE
Protein, ur: NEGATIVE mg/dL
RBC / HPF: NONE SEEN RBC/hpf (ref 0–5)
Specific Gravity, Urine: 1.018 (ref 1.005–1.030)
WBC, UA: NONE SEEN WBC/hpf (ref 0–5)
pH: 5 (ref 5.0–8.0)

## 2016-09-29 LAB — I-STAT TROPONIN, ED: Troponin i, poc: 0.01 ng/mL (ref 0.00–0.08)

## 2016-09-29 LAB — CBG MONITORING, ED: Glucose-Capillary: 115 mg/dL — ABNORMAL HIGH (ref 65–99)

## 2016-09-29 MED ORDER — MECLIZINE HCL 25 MG PO TABS
25.0000 mg | ORAL_TABLET | Freq: Once | ORAL | Status: AC
  Administered 2016-09-29: 25 mg via ORAL
  Filled 2016-09-29: qty 1

## 2016-09-29 MED ORDER — MECLIZINE HCL 25 MG PO TABS: 25.0000 mg | ORAL_TABLET | Freq: Three times a day (TID) | ORAL | 0 refills | Status: DC | PRN

## 2016-09-29 NOTE — ED Notes (Signed)
Patient ambulated in hallway with RN standby. Denies dizziness during ambulation or position change.

## 2016-09-29 NOTE — ED Provider Notes (Signed)
[facility] DEPT Provider Note   CSN: [id number] Arrival date & time: 09/29/16  1251     History   Chief Complaint Chief Complaint  Patient presents with  . Fatigue  . Leg Swelling    HPI [name] is a 61 y.o. female.  HPI Patient reports she awakened dizzy this morning. She reports things are working kind of rocking back and forth. She reports she got she could go ahead and go to the bathroom and go back to bed and made good improvement. She did make it to the bathroom and rested for a bit. She continued her morning going to the kitchen and then sitting in her chair. She reports though every time she got up to change position the rocking back and forth and the staggering feeling came back. It improved with rest and remaining still. Patient denies any chest pain or dyspnea associated with this. No visual changes. She does note that recently her feet seem to be more swollen. She is not having pain in the calves. Past Medical History:  Diagnosis Date  . Adrenal mass ([place]) 03/226   Benign  . Arthritis    knees/multiple orthopedic conditons; lower back  . Asthma    per pt  . 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 Depression   . Diabetes mellitus    120s usually fasting  . Dizziness, nonspecific   . DVT (deep venous thrombosis) (HCC)    Recurrent  . Fibromyalgia   . GERD (gastroesophageal reflux disease)   . Heart murmur   . History of blood transfusion   . History of cocaine abuse    Remote history   . Hypertension    takes meds daily  . Lupus Anticoagulant Positive   . On home oxygen therapy    at night  . Shortness of breath    exertion or lying flat  . Sleep apnea    2l of oxygen at night    Patient Active Problem List   Diagnosis Date Noted  . Chronic pain of right knee 05/24/2016  . Effusion, right knee 04/28/2016  . Presence of right  artificial knee joint 04/28/2016  . Abnormal LFTs   . Decreased sensation   . Paresthesia 06/12/2014  . Obstruction of kidney 04/24/2014  . Diabetes type 2, uncontrolled ([place]) 04/24/2014  . Chronic anticoagulation 04/24/2014  . Crack cocaine use 04/24/2014  . Depression 04/24/2014  . Fibromyalgia 04/24/2014  . COPD (chronic obstructive pulmonary disease) ([place]) 04/24/2014  . Obstructive sleep apnea on CPAP 04/24/2014  . Lupus anticoagulant positive 04/24/2014  . Adrenal mass, right ([place]) 04/24/2014  . Right kidney mass 04/24/2014  . Hydronephrosis of right kidney 04/24/2014  . Supratherapeutic INR 04/24/2014  . Renal mass, right 04/24/2014  . Diabetes type 2, controlled ([place])   . Right lower quadrant abdominal pain   . Morbid obesity ([place]) 08/21/2013  . Unspecified constipation 08/19/2013  . COPD exacerbation ([place]) 08/17/2013  . Morbid obesity with BMI of 45.0-49.9, adult ([place]) 01/05/2012  . Bilateral deep vein thromboses ([place]) 12/13/2011  . Abdominal pain 11/05/2011  . Hypokalemia 11/05/2011  . Sciatica 11/05/2011  . Type II or unspecified type diabetes mellitus without mention of complication, not stated as uncontrolled 11/05/2011  . HTN (hypertension) 11/05/2011    Past Surgical History:  Procedure Laterality Date  . ABDOMINAL HYSTERECTOMY  1989   Fibroids  .  ANTERIOR LUMBAR FUSION  01/03/2012   Procedure: ANTERIOR LUMBAR FUSION 1 LEVEL;  Surgeon: Winfield Cunas, MD;  Location: [facility];  Service: Neurosurgery;  Laterality: N/A;  Lumbar Four-Five Anterior Lumbar Interbody Fusion with Instrumentation  . BACK SURGERY     cervical spine---disk disease  . CARPAL TUNNEL RELEASE     Bilateral  . CESAREAN SECTION    . CYSTOSCOPY/RETROGRADE/URETEROSCOPY/STONE EXTRACTION WITH BASKET Right 04/26/2014   Procedure: CYSTOSCOPY/ RIGHT RETROGRADE/ RIGHT URETEROSCOPY/URETERAL AND RENAL PELVIS BIOPSY;  Surgeon: Alexis Frock, MD;  Location: [facility];  Service: Urology;  Laterality:  Right;  . gallstones removed    . 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   bilateral  . right elbow surgery    . TUBAL LIGATION      OB History    No data available       Home Medications    Prior to Admission medications   Medication Sig Start Date End Date Taking? Authorizing Provider  albuterol (PROVENTIL HFA;VENTOLIN HFA) 108 (90 BASE) MCG/ACT inhaler Inhale 2 puffs into the lungs every 4 (four) hours as needed for shortness of breath.   Yes [provider]  allopurinol (ZYLOPRIM) 100 MG tablet Take 100mg  by mouth daily 07/03/16  Yes [provider]  amLODipine (NORVASC) 5 MG tablet Take 5 mg by mouth daily.   Yes [provider]  canagliflozin (INVOKANA) 100 MG TABS tablet Take 100 mg by mouth daily.   Yes [provider]  CARTIA XT 240 MG 24 hr capsule Take 240 mg by mouth daily. 06/17/14  Yes [provider]  cloNIDine (CATAPRES) 0.1 MG tablet Take 0.1 mg by mouth 2 (two) times daily.   Yes [provider]  cyclobenzaprine (FLEXERIL) 10 MG tablet Take 10 mg by mouth 2 (two) times daily as needed for muscle spasms.  06/05/14  Yes [provider]  diclofenac sodium (VOLTAREN) 1 % GEL Apply 2 g topically daily as needed (fibromyalgia pain).   Yes [provider]  DULoxetine (CYMBALTA) 60 MG capsule Take 60 mg by mouth every morning.    Yes [provider]  gabapentin (NEURONTIN) 300 MG capsule Take 300 mg by mouth at bedtime.    Yes [provider]  metFORMIN (GLUCOPHAGE) 500 MG tablet Take 500 mg by mouth at bedtime.   Yes [provider]  omeprazole (PRILOSEC) 20 MG capsule Take 20 mg by mouth 2 (two) times daily.   Yes [provider]  polyethylene glycol powder (GLYCOLAX/MIRALAX) powder Take by mouth daily.  06/09/14  Yes [provider]  pravastatin (PRAVACHOL) 40 MG tablet Take 40 mg by mouth every evening.  06/15/15  Yes [provider]  QUEtiapine (SEROQUEL)  50 MG tablet Take 50 mg by mouth at bedtime.    Yes [provider]  tiZANidine (ZANAFLEX) 4 MG tablet Take 4 mg by mouth daily as needed for spasms. 09/11/16  Yes [provider]  warfarin (COUMADIN) 5 MG tablet Take 1 tablet (5 mg total) by mouth daily. 07/10/16  Yes Owens Shark, NP  albuterol (PROVENTIL) (2.5 MG/3ML) 0.083% nebulizer solution Take 2.5 mg by nebulization every 6 (six) hours as needed for wheezing or shortness of breath.    [provider]  HYDROcodone-acetaminophen (NORCO) 5-325 MG tablet Take 1-2 tablets by mouth every 6 (six) hours as needed for severe pain. 09/01/16   Orlie Dakin, MD  loratadine (CLARITIN) 10 MG tablet Take 10 mg by mouth daily as needed for allergies.  [provider]  meclizine (ANTIVERT) 25 MG tablet Take 1 tablet (25 mg total) by mouth 3 (three) times daily as needed for dizziness. 09/29/16   Charlesetta Shanks, MD  metoCLOPramide (REGLAN) 10 MG tablet Take 1 tablet (10 mg total) by mouth every 6 (six) hours as needed for nausea (or headache). Patient not taking: Reported on 7/34/1937 11/20/38   Delora Fuel, MD  oxyCODONE-acetaminophen (PERCOCET) 5-325 MG tablet Take 1 tablet by mouth every 12 (twelve) hours as needed for moderate pain. Patient not taking: Reported on 09/29/2016 05/24/16   Meredith Pel, MD  traMADol Veatrice Bourbon) 50 MG tablet TAKE 1-2 TABLETS BY MOUTH EVERY DAY AS NEEDED FOR PAIN 09/04/16   Meredith Pel, MD    Family History Family History  Problem Relation Age of Onset  . Hypertension Mother   . Diabetes Mother   . Cancer Mother        Pancreatic  . Hypertension Father   . Heart failure Father     Social History Social History  Substance Use Topics  . Smoking status: Former Smoker    Start date: 03/20/2001    Quit date: 03/21/2003  . Smokeless tobacco: Never Used     Comment: smoked for 2 years  . Alcohol use No     Comment: beer in past      Allergies   Lisinopril and  Corticosteroids   Review of Systems Review of Systems 10 Systems reviewed and are negative for acute change except as noted in the HPI.  Physical Exam Updated Vital Signs BP (!) 154/97   Pulse 85   Temp 98.4 F (36.9 C)   Resp 20   Ht 5\' 7"  (1.702 m)   Wt 117.9 kg (260 lb)   SpO2 99%   BMI 40.72 kg/m   Physical Exam  Constitutional: She is oriented to person, place, and time. She appears well-developed and well-nourished. No distress.  Patient is alert and nontoxic. In no respiratory distress. Obesity.  HENT:  Head: Normocephalic and atraumatic.  Mouth/Throat: Oropharynx is clear and moist.  Eyes: Pupils are equal, round, and reactive to light. Conjunctivae and EOM are normal.  Neck: Neck supple.  Cardiovascular: Normal rate and regular rhythm.   No murmur heard. Pulmonary/Chest: Effort normal and breath sounds normal. No respiratory distress.  Abdominal: Soft. There is no tenderness.  Musculoskeletal: Normal range of motion. She exhibits edema. She exhibits no tenderness.  Minimal edema of the feet. Calves soft and nontender.  Neurological: She is alert and oriented to person, place, and time. No cranial nerve deficit or sensory deficit. She exhibits normal muscle tone. Coordination normal.  Normal finger-nose exam. No pronator drift. Normal heel shin exam. Normal cognitive function and speech. Very subtle lateral nystagmus. Patient feels more vertiginous as I have her sit forward in the stretcher or have her move eyes to lateral positions.  Skin: Skin is warm and dry.  Psychiatric: She has a normal mood and affect.  Nursing note and vitals reviewed.    ED Treatments / Results  Labs (all labs ordered are listed, but only abnormal results are displayed) Labs Reviewed  BASIC METABOLIC PANEL - Abnormal; Notable for the following:       Result Value   Chloride 99 (*)    Glucose, Bld 128 (*)    All other components within normal limits  CBC - Abnormal; Notable for the  following:    Hemoglobin 11.1 (*)    HCT 34.7 (*)  MCH 25.7 (*)    RDW 17.8 (*)    All other components within normal limits  URINALYSIS, ROUTINE W REFLEX MICROSCOPIC - Abnormal; Notable for the following:    Color, Urine STRAW (*)    Glucose, UA >=500 (*)    Hgb urine dipstick SMALL (*)    Bacteria, UA RARE (*)    Squamous Epithelial / LPF 0-5 (*)    All other components within normal limits  CBG MONITORING, ED - Abnormal; Notable for the following:    Glucose-Capillary 115 (*)    All other components within normal limits  I-STAT TROPOININ, ED    EKG  EKG Interpretation  Date/Time:  Friday September 29 2016 12:57:10 EDT Ventricular Rate:  85 PR Interval:  168 QRS Duration: 90 QT Interval:  404 QTC Calculation: 480 R Axis:   34 Text Interpretation:  Normal sinus rhythm Nonspecific T wave abnormality Prolonged QT Abnormal ECG Confirmed by Charlesetta Shanks [phone]) on 09/29/2016 4:52:12 PM       Radiology No results found.  Procedures Procedures (including critical care time)  Medications Ordered in ED Medications  meclizine (ANTIVERT) tablet 25 mg (25 mg Oral Given 09/29/16 1538)     Initial Impression / Assessment and Plan / ED Course  I have reviewed the triage vital signs and the nursing notes.  Pertinent labs & imaging results that were available during my care of the patient were reviewed by me and considered in my medical decision making (see chart for details).      Final Clinical Impressions(s) / ED Diagnoses   Final diagnoses:  Vertigo  Bilateral lower extremity edema  Patient has ambulated well after Antivert. No further dizziness. History most suggestive of peripheral vertigo. Patient is alert and appropriate. She has no focal neurologic deficits. No visual deficits. Patient complains of pedal swelling. She has minimal trace edema at the ankles. Lungs are clear. No hypoxia. At this time I do not suspect CHF. No calf tenderness or unilateral swelling. I do  not have suspicion for DVT at this time.  New Prescriptions New Prescriptions   MECLIZINE (ANTIVERT) 25 MG TABLET    Take 1 tablet (25 mg total) by mouth 3 (three) times daily as needed for dizziness.     Charlesetta Shanks, MD 09/29/16 [phone]

## 2016-09-29 NOTE — ED Notes (Signed)
Pt CBG was 115, notified Greg(RN)

## 2016-09-29 NOTE — ED Triage Notes (Signed)
Arrived via EMS from home patient complaints of general weakness, tired, dizzy, with bilateral lower feet edema when she woke up at 0730.  Patient stated walking around and staggered laid down with no relief. Alert answering and following commands appropriate. Called Doctor sent to ED for evaluation.

## 2016-10-04 ENCOUNTER — Ambulatory Visit: Payer: Medicare Other | Admitting: Physical Therapy

## 2016-10-05 ENCOUNTER — Ambulatory Visit: Payer: Medicare Other | Admitting: Physical Therapy

## 2016-10-06 ENCOUNTER — Other Ambulatory Visit: Payer: Self-pay | Admitting: *Deleted

## 2016-10-06 DIAGNOSIS — I82493 Acute embolism and thrombosis of other specified deep vein of lower extremity, bilateral: Secondary | ICD-10-CM

## 2016-10-09 ENCOUNTER — Other Ambulatory Visit (HOSPITAL_BASED_OUTPATIENT_CLINIC_OR_DEPARTMENT_OTHER): Payer: Medicare Other

## 2016-10-09 ENCOUNTER — Telehealth: Payer: Self-pay | Admitting: *Deleted

## 2016-10-09 DIAGNOSIS — Z86718 Personal history of other venous thrombosis and embolism: Secondary | ICD-10-CM

## 2016-10-09 DIAGNOSIS — Z7901 Long term (current) use of anticoagulants: Secondary | ICD-10-CM

## 2016-10-09 DIAGNOSIS — I82493 Acute embolism and thrombosis of other specified deep vein of lower extremity, bilateral: Secondary | ICD-10-CM

## 2016-10-09 LAB — CBC WITH DIFFERENTIAL/PLATELET
BASO%: 0.2 % (ref 0.0–2.0)
Basophils Absolute: 0 10*3/uL (ref 0.0–0.1)
EOS%: 3 % (ref 0.0–7.0)
Eosinophils Absolute: 0.2 10*3/uL (ref 0.0–0.5)
HCT: 36.2 % (ref 34.8–46.6)
HGB: 11.4 g/dL — ABNORMAL LOW (ref 11.6–15.9)
LYMPH%: 34.8 % (ref 14.0–49.7)
MCH: 26.1 pg (ref 25.1–34.0)
MCHC: 31.5 g/dL (ref 31.5–36.0)
MCV: 82.8 fL (ref 79.5–101.0)
MONO#: 0.5 10*3/uL (ref 0.1–0.9)
MONO%: 8.2 % (ref 0.0–14.0)
NEUT#: 3.2 10*3/uL (ref 1.5–6.5)
NEUT%: 53.8 % (ref 38.4–76.8)
Platelets: 357 10*3/uL (ref 145–400)
RBC: 4.37 10*6/uL (ref 3.70–5.45)
RDW: 18.7 % — ABNORMAL HIGH (ref 11.2–14.5)
WBC: 6 10*3/uL (ref 3.9–10.3)
lymph#: 2.1 10*3/uL (ref 0.9–3.3)

## 2016-10-09 LAB — PROTIME-INR
INR: 1.3 — ABNORMAL LOW (ref 2.00–3.50)
Protime: 15.6 Seconds — ABNORMAL HIGH (ref 10.6–13.4)

## 2016-10-09 NOTE — Telephone Encounter (Signed)
Called pt, she reports she is currently taking 5mg  most days. Takes 7.5 mg (1.5 tablets) twice per week. Pt reports she has decreased meat intake. Eating "lots of vegetables." INR reviewed by Dr. Benay Spice: Continue same dose, repeat lab in one week. Message to schedulers.

## 2016-10-10 ENCOUNTER — Telehealth: Payer: Self-pay | Admitting: Oncology

## 2016-10-10 NOTE — Telephone Encounter (Signed)
Scheduled lab appt per sch message from [facility] - patient is aware of appt date and time.

## 2016-10-11 ENCOUNTER — Ambulatory Visit: Payer: Medicare Other | Admitting: Physical Therapy

## 2016-10-11 ENCOUNTER — Encounter: Payer: Self-pay | Admitting: Physical Therapy

## 2016-10-11 ENCOUNTER — Ambulatory Visit (INDEPENDENT_AMBULATORY_CARE_PROVIDER_SITE_OTHER): Payer: Medicare Other | Admitting: Orthopedic Surgery

## 2016-10-11 DIAGNOSIS — M25562 Pain in left knee: Secondary | ICD-10-CM

## 2016-10-11 DIAGNOSIS — M6281 Muscle weakness (generalized): Secondary | ICD-10-CM

## 2016-10-11 DIAGNOSIS — M25561 Pain in right knee: Secondary | ICD-10-CM

## 2016-10-11 DIAGNOSIS — M5440 Lumbago with sciatica, unspecified side: Secondary | ICD-10-CM

## 2016-10-11 DIAGNOSIS — R262 Difficulty in walking, not elsewhere classified: Secondary | ICD-10-CM

## 2016-10-11 DIAGNOSIS — R293 Abnormal posture: Secondary | ICD-10-CM

## 2016-10-11 NOTE — Therapy (Signed)
[place], [place], [address] Phone: [phone]   Fax:  [phone]  Physical Therapy Treatment  Patient Details  Name: [name] MRN: [id number] Date of Birth: 1956/01/23 Referring Provider: Alphonzo Severance MD  Encounter Date: 10/11/2016      PT End of Session - 10/11/16 1422    Visit Number 4   Number of Visits 12   Date for PT Re-Evaluation 10/26/16   PT Start Time 1331   PT Stop Time 1417   PT Time Calculation (min) 46 min   Activity Tolerance Patient tolerated treatment well   Behavior During Therapy [facility] for tasks assessed/performed      Past Medical History:  Diagnosis Date  . Adrenal mass ([place]) 03/226   Benign  . Arthritis    knees/multiple orthopedic conditons; lower back  . Asthma    per pt  . 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 Depression   . Diabetes mellitus    120s usually fasting  . Dizziness, nonspecific   . DVT (deep venous thrombosis) (HCC)    Recurrent  . Fibromyalgia   . GERD (gastroesophageal reflux disease)   . Heart murmur   . History of blood transfusion   . History of cocaine abuse    Remote history   . Hypertension    takes meds daily  . Lupus Anticoagulant Positive   . On home oxygen therapy    at night  . Shortness of breath    exertion or lying flat  . Sleep apnea    2l of oxygen at night    Past Surgical History:  Procedure Laterality Date  . ABDOMINAL HYSTERECTOMY  1989   Fibroids  . ANTERIOR LUMBAR FUSION  01/03/2012   Procedure: ANTERIOR LUMBAR FUSION 1 LEVEL;  Surgeon: Winfield Cunas, MD;  Location: [facility];  Service: Neurosurgery;  Laterality: N/A;  Lumbar Four-Five Anterior Lumbar Interbody Fusion with Instrumentation  . BACK SURGERY     cervical spine---disk disease  . CARPAL TUNNEL RELEASE     Bilateral  . CESAREAN  SECTION    . CYSTOSCOPY/RETROGRADE/URETEROSCOPY/STONE EXTRACTION WITH BASKET Right 04/26/2014   Procedure: CYSTOSCOPY/ RIGHT RETROGRADE/ RIGHT URETEROSCOPY/URETERAL AND RENAL PELVIS BIOPSY;  Surgeon: Alexis Frock, MD;  Location: [facility];  Service: Urology;  Laterality: Right;  . gallstones removed    . 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   bilateral  . right elbow surgery    . TUBAL LIGATION      There were no vitals filed for this visit.      Subjective Assessment - 10/11/16 1337    Subjective Patient has had vritgo 2 fridays ago.  She had to go to the ER.  She could not attend last week due to vertigo.  She noted she is currently seeing a chiropracter 3 x a week.  (Back and neck) She saw Dr. Marlou Sa this morning .  She needs to tighten knees a little.  She needs 3 more visits.  of PT>   Currently in Pain? No/denies   Pain Location Neck   Pain Score 8   Pain Location Back   Pain Orientation Right   Pain Type Neuropathic pain   Pain Frequency Constant   Aggravating Factors  ADL   Pain Relieving Factors change of position   Pain Location Knee   Pain Orientation Left  Vertigo.  No increased pain post session.  She was fatigued.    PT Treatment/Interventions ADLs/Self Care Home  Management;Cryotherapy;Electrical Stimulation;Iontophoresis 4mg /ml Dexamethasone;Moist Heat;Ultrasound;Gait training;Stair training;Functional mobility training;Patient/family education;Neuromuscular re-education;Therapeutic exercise;Therapeutic activities;Manual techniques;Passive range of motion;Vasopneumatic Device;Taping;Dry needling   PT Next Visit Plan review newest HEP   PT Home Exercise Plan Level 1 knee quad set, SAQ LAQ  SLR,  step up, hip SLR standing 3 way, heel lifts and hamstring curls.   Consulted and Agree with Plan of Care Patient      Patient will benefit from skilled therapeutic intervention in order to improve the following deficits and impairments:  Decreased activity tolerance, Decreased mobility, Decreased range of motion, Decreased strength, Difficulty walking, Obesity, Pain, Improper body mechanics, Postural dysfunction, Increased edema, Increased fascial restricitons, Increased muscle spasms  Visit Diagnosis: Acute pain of left knee  Acute pain of right knee  Difficulty in walking, not elsewhere classified  Muscle weakness (generalized)  Acute bilateral low back pain with sciatica, sciatica laterality unspecified  Abnormal posture     Problem List Patient Active Problem List   Diagnosis Date Noted  . Chronic pain of right knee 05/24/2016  . Effusion, right knee 04/28/2016  . Presence of right artificial knee joint 04/28/2016  . Abnormal LFTs   . Decreased sensation   . Paresthesia 06/12/2014  . Obstruction of kidney 04/24/2014  . Diabetes type 2, uncontrolled ([place]) 04/24/2014  . Chronic anticoagulation 04/24/2014  . Crack cocaine use 04/24/2014  . Depression 04/24/2014  . Fibromyalgia 04/24/2014  . COPD (chronic obstructive pulmonary disease) ([place]) 04/24/2014  . Obstructive sleep apnea on CPAP 04/24/2014  . Lupus anticoagulant positive 04/24/2014  . Adrenal mass, right ([place]) 04/24/2014  . Right kidney mass 04/24/2014  . Hydronephrosis of right  kidney 04/24/2014  . Supratherapeutic INR 04/24/2014  . Renal mass, right 04/24/2014  . Diabetes type 2, controlled ([place])   . Right lower quadrant abdominal pain   . Morbid obesity ([place]) 08/21/2013  . Unspecified constipation 08/19/2013  . COPD exacerbation ([place]) 08/17/2013  . Morbid obesity with BMI of 45.0-49.9, adult ([place]) 01/05/2012  . Bilateral deep vein thromboses ([place]) 12/13/2011  . Abdominal pain 11/05/2011  . Hypokalemia 11/05/2011  . Sciatica 11/05/2011  . Type II or unspecified type diabetes mellitus without mention of complication, not stated as uncontrolled 11/05/2011  . HTN (hypertension) 11/05/2011    Lafern Brinkley PTA 10/11/2016, 2:28 PM  [facility] [address] [place], [place], [address] Phone: [phone]   Fax:  [phone]  Name: [name] MRN: [id number] Date of Birth: May 16, 1955  [place], [place], [address] Phone: [phone]   Fax:  [phone]  Physical Therapy Treatment  Patient Details  Name: [name] MRN: [id number] Date of Birth: 1956/01/23 Referring Provider: Alphonzo Severance MD  Encounter Date: 10/11/2016      PT End of Session - 10/11/16 1422    Visit Number 4   Number of Visits 12   Date for PT Re-Evaluation 10/26/16   PT Start Time 1331   PT Stop Time 1417   PT Time Calculation (min) 46 min   Activity Tolerance Patient tolerated treatment well   Behavior During Therapy [facility] for tasks assessed/performed      Past Medical History:  Diagnosis Date  . Adrenal mass ([place]) 03/226   Benign  . Arthritis    knees/multiple orthopedic conditons; lower back  . Asthma    per pt  . 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 Depression   . Diabetes mellitus    120s usually fasting  . Dizziness, nonspecific   . DVT (deep venous thrombosis) (HCC)    Recurrent  . Fibromyalgia   . GERD (gastroesophageal reflux disease)   . Heart murmur   . History of blood transfusion   . History of cocaine abuse    Remote history   . Hypertension    takes meds daily  . Lupus Anticoagulant Positive   . On home oxygen therapy    at night  . Shortness of breath    exertion or lying flat  . Sleep apnea    2l of oxygen at night    Past Surgical History:  Procedure Laterality Date  . ABDOMINAL HYSTERECTOMY  1989   Fibroids  . ANTERIOR LUMBAR FUSION  01/03/2012   Procedure: ANTERIOR LUMBAR FUSION 1 LEVEL;  Surgeon: Winfield Cunas, MD;  Location: [place] [facility];  Service: Neurosurgery;  Laterality: N/A;  Lumbar Four-Five Anterior Lumbar Interbody Fusion with Instrumentation  . BACK SURGERY     cervical spine---disk disease  . CARPAL TUNNEL RELEASE     Bilateral  . CESAREAN  SECTION    . CYSTOSCOPY/RETROGRADE/URETEROSCOPY/STONE EXTRACTION WITH BASKET Right 04/26/2014   Procedure: CYSTOSCOPY/ RIGHT RETROGRADE/ RIGHT URETEROSCOPY/URETERAL AND RENAL PELVIS BIOPSY;  Surgeon: Alexis Frock, MD;  Location: [facility];  Service: Urology;  Laterality: Right;  . gallstones removed    . 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   bilateral  . right elbow surgery    . TUBAL LIGATION      There were no vitals filed for this visit.      Subjective Assessment - 10/11/16 1337    Subjective Patient has had vritgo 2 fridays ago.  She had to go to the ER.  She could not attend last week due to vertigo.  She noted she is currently seeing a chiropracter 3 x a week.  (Back and neck) She saw Dr. Marlou Sa this morning .  She needs to tighten knees a little.  She needs 3 more visits.  of PT>   Currently in Pain? No/denies   Pain Location Neck   Pain Score 8   Pain Location Back   Pain Orientation Right   Pain Type Neuropathic pain   Pain Frequency Constant   Aggravating Factors  ADL   Pain Relieving Factors change of position   Pain Location Knee   Pain Orientation Left

## 2016-10-11 NOTE — Patient Instructions (Signed)
Issued from Exercise drawer: Daily 5-20 x each Hip SLR standing all issued except for Adduction Standing heel raise Sitting hamstring curls - red band 6 inch step up

## 2016-10-12 ENCOUNTER — Encounter: Payer: Self-pay | Admitting: Physical Therapy

## 2016-10-12 ENCOUNTER — Ambulatory Visit: Payer: Medicare Other | Admitting: Physical Therapy

## 2016-10-12 DIAGNOSIS — M25562 Pain in left knee: Secondary | ICD-10-CM | POA: Diagnosis not present

## 2016-10-12 DIAGNOSIS — R262 Difficulty in walking, not elsewhere classified: Secondary | ICD-10-CM

## 2016-10-12 DIAGNOSIS — R293 Abnormal posture: Secondary | ICD-10-CM

## 2016-10-12 DIAGNOSIS — M5440 Lumbago with sciatica, unspecified side: Secondary | ICD-10-CM

## 2016-10-12 DIAGNOSIS — M25561 Pain in right knee: Secondary | ICD-10-CM

## 2016-10-12 DIAGNOSIS — M6281 Muscle weakness (generalized): Secondary | ICD-10-CM

## 2016-10-12 NOTE — Therapy (Signed)
[facility] [address] [place], [place], [address] Phone: [phone]   Fax:  [phone]  Physical Therapy Treatment  Patient Details  Name: [name] MRN: [id number] Date of Birth: 1955/08/09 Referring Provider: Dorene Grebe MD  Encounter Date: 10/12/2016      PT End of Session - 10/12/16 0842    Visit Number 5   Number of Visits 12   Date for PT Re-Evaluation 10/26/16   PT Start Time 0801   PT Stop Time 0842   PT Time Calculation (min) 41 min   Activity Tolerance Patient tolerated treatment well   Behavior During Therapy [facility] for tasks assessed/performed      Past Medical History:  Diagnosis Date  . Adrenal mass (HCC) 03/226   Benign  . Arthritis    knees/multiple orthopedic conditons; lower back  . Asthma    per pt  . 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 Depression   . Diabetes mellitus    120s usually fasting  . Dizziness, nonspecific   . DVT (deep venous thrombosis) (HCC)    Recurrent  . Fibromyalgia   . GERD (gastroesophageal reflux disease)   . Heart murmur   . History of blood transfusion   . History of cocaine abuse    Remote history   . Hypertension    takes meds daily  . Lupus Anticoagulant Positive   . On home oxygen therapy    at night  . Shortness of breath    exertion or lying flat  . Sleep apnea    2l of oxygen at night    Past Surgical History:  Procedure Laterality Date  . ABDOMINAL HYSTERECTOMY  1989   Fibroids  . ANTERIOR LUMBAR FUSION  01/03/2012   Procedure: ANTERIOR LUMBAR FUSION 1 LEVEL;  Surgeon: Carmela Hurt, MD;  Location: [facility];  Service: Neurosurgery;  Laterality: N/A;  Lumbar Four-Five Anterior Lumbar Interbody Fusion with Instrumentation  . BACK SURGERY     cervical spine---disk disease  . CARPAL TUNNEL RELEASE     Bilateral  . CESAREAN  SECTION    . CYSTOSCOPY/RETROGRADE/URETEROSCOPY/STONE EXTRACTION WITH BASKET Right 04/26/2014   Procedure: CYSTOSCOPY/ RIGHT RETROGRADE/ RIGHT URETEROSCOPY/URETERAL AND RENAL PELVIS BIOPSY;  Surgeon: Sebastian Ache, MD;  Location: [facility];  Service: Urology;  Laterality: Right;  . gallstones removed    . 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   bilateral  . right elbow surgery    . TUBAL LIGATION      There were no vitals filed for this visit.      Subjective Assessment - 10/12/16 0804    Subjective I am tired.  Did not do her exercises.  She had 3 appointments yesterday and she was tired. Pain the same as yesterday.            [facility] PT Assessment - 10/12/16 0001      AROM   Right Knee Flexion 117   Left Knee Flexion 112                     [facility] Adult PT Treatment/Exercise - 10/12/16 0001      Knee/Hip Exercises: Stretches   Passive Hamstring Stretch 3 reps;30 seconds     Knee/Hip Exercises: Aerobic   Nustep 6 minutes L4     Knee/Hip Exercises: Standing   Heel Raises 10 reps  Forward Step Up Both;1 set;10 reps;Hand Hold: 2;Step Height: 4"     Knee/Hip Exercises: Seated   Long Arc Quad 2 sets;10 reps   Long Arc Quad Weight 0 lbs.   Long Arc Quad Limitations 5 seconds   Hamstring Curl 2 sets;10 reps   Hamstring Limitations green band     Knee/Hip Exercises: Supine   Short Arc Quad Sets Both;Strengthening   Short Arc Quad Sets Limitations 3  LBS 10 X   Heel Slides 1 set;10 reps                PT Education - 10/11/16 1422    Education provided Yes   Education Details HEP   Person(s) Educated Patient   Methods Explanation;Demonstration;Verbal cues;Handout   Comprehension Verbalized understanding;Returned demonstration          PT Short Term Goals - 09/21/16 0822      PT SHORT TERM [place] #1   Title STG=LTG           PT Long Term Goals - 10/11/16 1426      PT LONG TERM [place] #1   Title Pt will be independent with advance HEP 10-26-16    Baseline does some bed exercises,  Vertigo limits some   Time 6   Period Weeks   Status On-going     PT LONG TERM [place] #2   Title bil  knee AROM flexion will improve to 2-120degrees for improved mobility to ride in car without increased pain 10-26-16   Time 6   Period Weeks   Status Unable to assess     PT LONG TERM [place] #3   Title Pt will be able to stand for up to 1 hour in order to complete shopping in store without exacerbation of pain in back or knees 10-26-16   Baseline Limited by Vertigo,  not doing lately   Time 6   Period Weeks   Status On-going     PT LONG TERM [place] #4   Title "FOTO will improve from  61%limitation    to  46% limitation   indicating improved functional mobility. 10-26-16   Time 6   Period Weeks   Status Unable to assess     PT LONG TERM [place] #5   Title Pt will be able to sleep 4 or more hours at night without waking due to pain while turning in bed for more restorative sleelp 10-26-16   Time 6   Period Weeks   Status Unable to assess     PT LONG TERM [place] #6   Title Pt will be able to ambulate at 2.62 ft/sec for community ambulation 10-26-16   Time 6   Period Weeks   Status Unable to assess               Plan - 10/12/16 9449    Clinical Impression Statement No knee pain at end of session.  No new goals met  AROM improving Knee flexion right117, left 112.   PT Treatment/Interventions ADLs/Self Care Home Management;Cryotherapy;Electrical Stimulation;Iontophoresis 45m/ml Dexamethasone;Moist Heat;Ultrasound;Gait training;Stair training;Functional mobility training;Patient/family education;Neuromuscular re-education;Therapeutic exercise;Therapeutic activities;Manual techniques;Passive range of motion;Vasopneumatic Device;Taping;Dry needling   PT Next Visit Plan standing hip,  step ups,  consider sit to stand .  work toward goals.   PT Home Exercise Plan Level 1 knee quad set, SAQ LAQ  SLR,  step up, hip SLR standing 3 way, heel lifts and hamstring  curls.   Consulted and Agree with Plan of Care Patient  Patient will benefit from skilled therapeutic intervention in order to improve the following deficits and impairments:  Decreased activity tolerance, Decreased mobility, Decreased range of motion, Decreased strength, Difficulty walking, Obesity, Pain, Improper body mechanics, Postural dysfunction, Increased edema, Increased fascial restricitons, Increased muscle spasms  Visit Diagnosis: Acute pain of left knee  Acute pain of right knee  Difficulty in walking, not elsewhere classified  Muscle weakness (generalized)  Acute bilateral low back pain with sciatica, sciatica laterality unspecified  Abnormal posture     Problem List Patient Active Problem List   Diagnosis Date Noted  . Chronic pain of right knee 05/24/2016  . Effusion, right knee 04/28/2016  . Presence of right artificial knee joint 04/28/2016  . Abnormal LFTs   . Decreased sensation   . Paresthesia 06/12/2014  . Obstruction of kidney 04/24/2014  . Diabetes type 2, uncontrolled ([place]) 04/24/2014  . Chronic anticoagulation 04/24/2014  . Crack cocaine use 04/24/2014  . Depression 04/24/2014  . Fibromyalgia 04/24/2014  . COPD (chronic obstructive pulmonary disease) ([place]) 04/24/2014  . Obstructive sleep apnea on CPAP 04/24/2014  . Lupus anticoagulant positive 04/24/2014  . Adrenal mass, right ([place]) 04/24/2014  . Right kidney mass 04/24/2014  . Hydronephrosis of right kidney 04/24/2014  . Supratherapeutic INR 04/24/2014  . Renal mass, right 04/24/2014  . Diabetes type 2, controlled ([place])   . Right lower quadrant abdominal pain   . Morbid obesity ([place]) 08/21/2013  . Unspecified constipation 08/19/2013  . COPD exacerbation ([place]) 08/17/2013  . Morbid obesity with BMI of 45.0-49.9, adult ([place]) 01/05/2012  . Bilateral deep vein thromboses ([place]) 12/13/2011  . Abdominal pain 11/05/2011  . Hypokalemia 11/05/2011  . Sciatica 11/05/2011  . Type II or  unspecified type diabetes mellitus without mention of complication, not stated as uncontrolled 11/05/2011  . HTN (hypertension) 11/05/2011    HARRIS,KAREN PTA 10/12/2016, 8:45 AM  [place], [place], [address] Phone: [phone]   Fax:  [phone]  Name: Alenna Russell [place] MRN: [id number] Date of Birth: 1956-02-16

## 2016-10-12 NOTE — Patient Instructions (Signed)
OK to do exercises 1 x a day when busy.

## 2016-10-13 ENCOUNTER — Encounter (INDEPENDENT_AMBULATORY_CARE_PROVIDER_SITE_OTHER): Payer: Self-pay | Admitting: Orthopedic Surgery
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
## 2016-10-15 IMAGING — CT CT ABD-PELV W/ CM
1 of 4 series · 13 of 32 positions shown, 18 images · IV contrast (OMNIPAQUE 300)
Comparison: CT scan 02/09/2014

CLINICAL DATA: Right lower quadrant and flank pain since yesterday.

EXAM:
CT ABDOMEN AND PELVIS WITH CONTRAST
TECHNIQUE: Multidetector CT imaging of the abdomen and pelvis was performed
using the standard protocol following bolus administration of
intravenous contrast.
CONTRAST:  50mL OMNIPAQUE IOHEXOL 300 MG/ML SOLN, 100mL OMNIPAQUE
IOHEXOL 300 MG/ML SOLN

[Series 2: abd/pel with · axial · 0.77mm/px · z∈[-278,+112]mm · 13 of 90 slices shown, 18 images]
[im 6/90  soft-tissue]
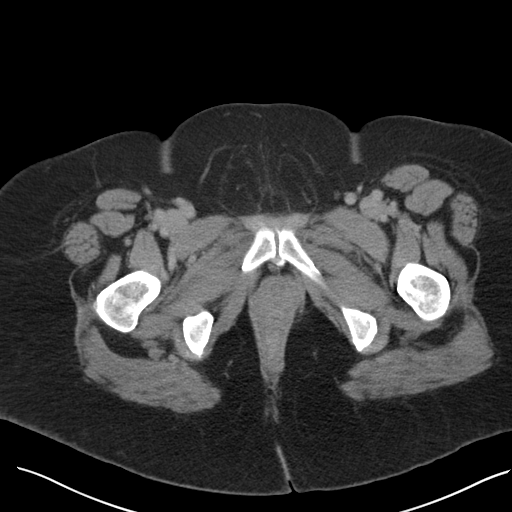
[im 6/90  bone]
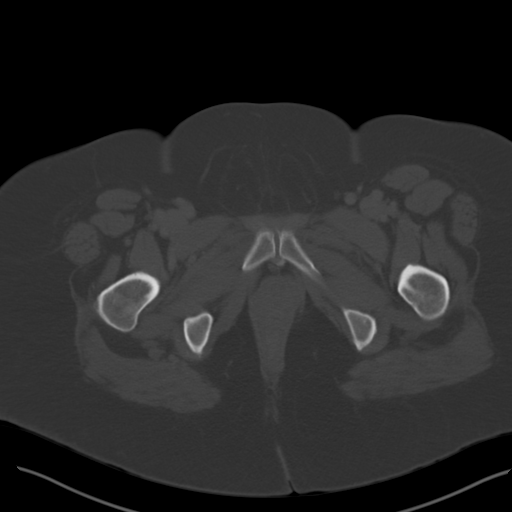
[im 12/90  soft-tissue]
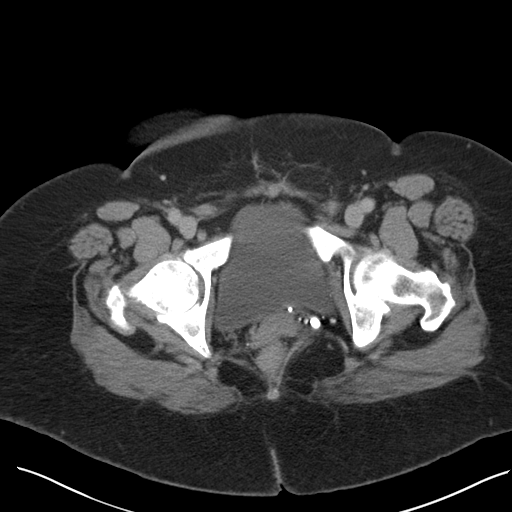
[im 23/90  soft-tissue]
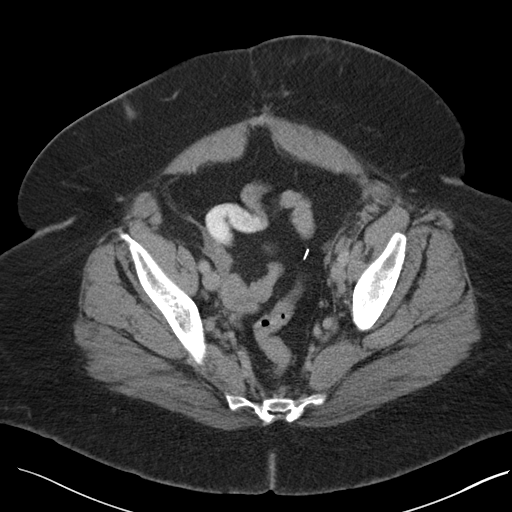
[im 28/90  soft-tissue]
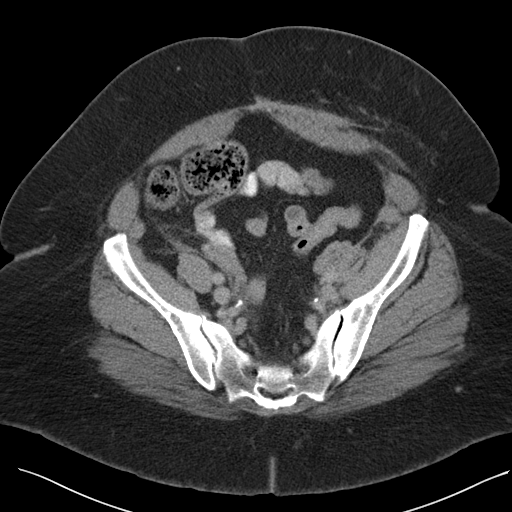
[im 34/90  soft-tissue]
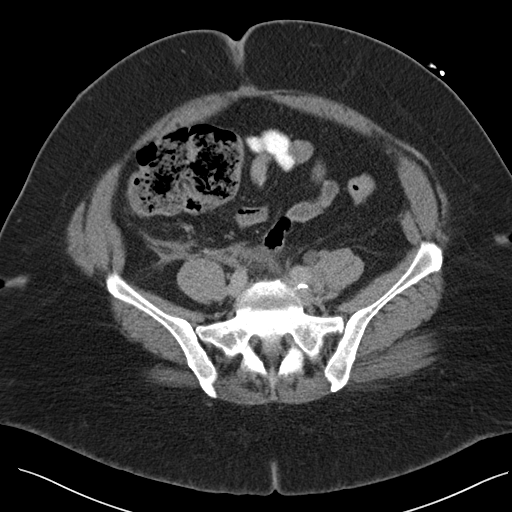
[im 39/90  soft-tissue]
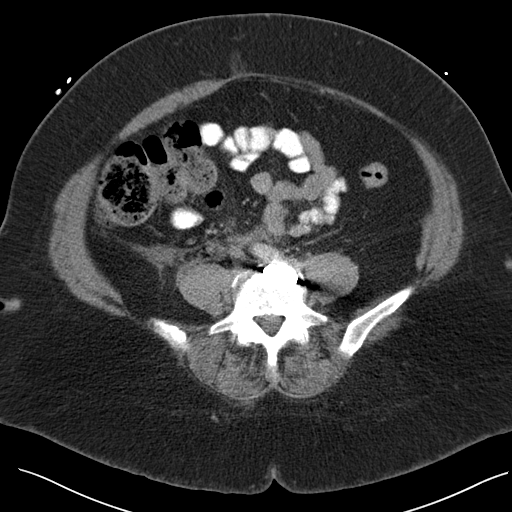
[im 51/90  soft-tissue]
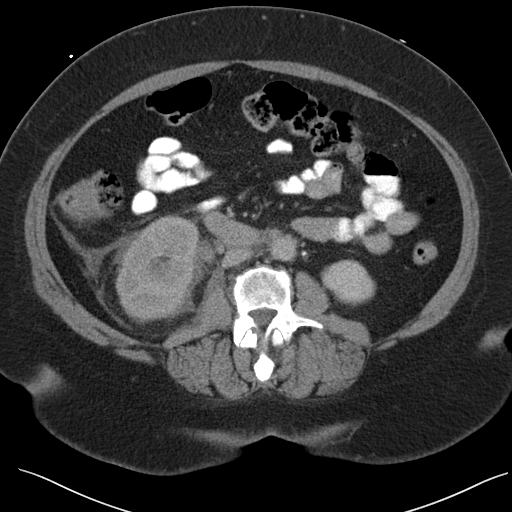
[im 56/90  soft-tissue]
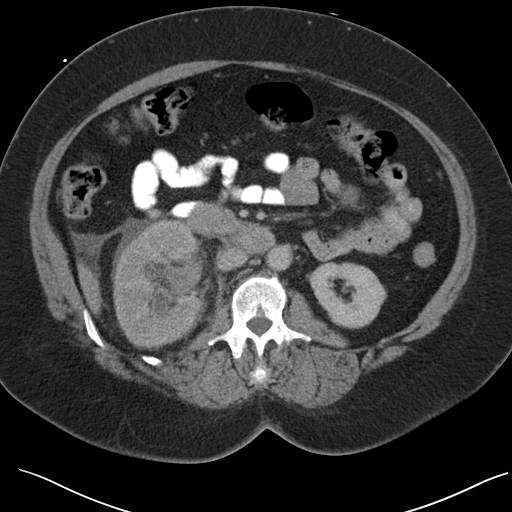
[im 62/90  soft-tissue]
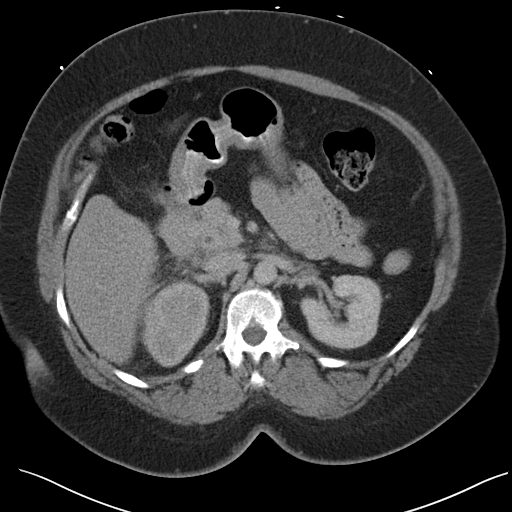
[im 62/90  bone]
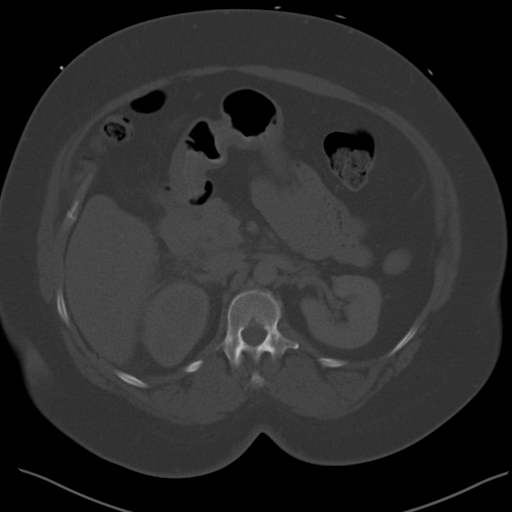
[im 67/90  soft-tissue]
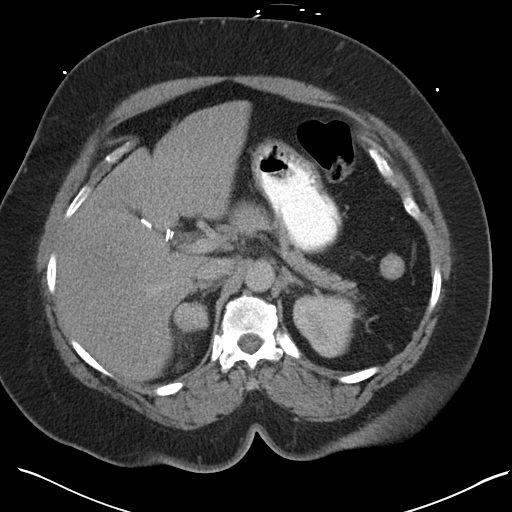
[im 67/90  lung]
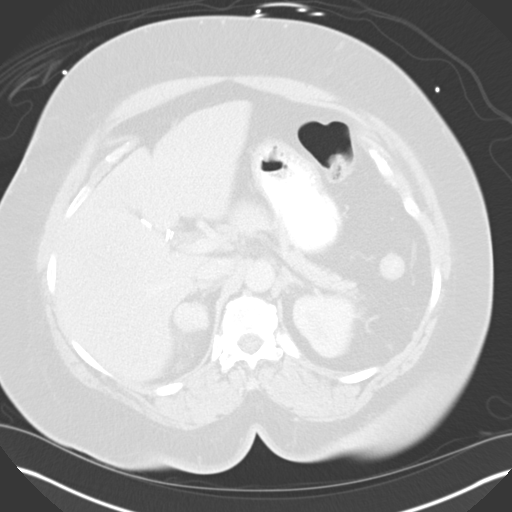
[im 73/90  lung]
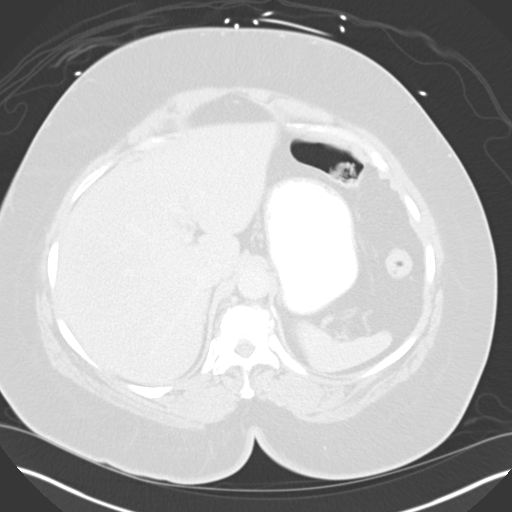
[im 78/90  soft-tissue]
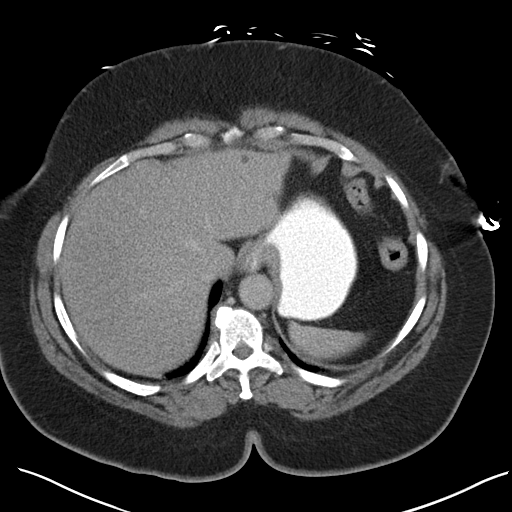
[im 78/90  lung]
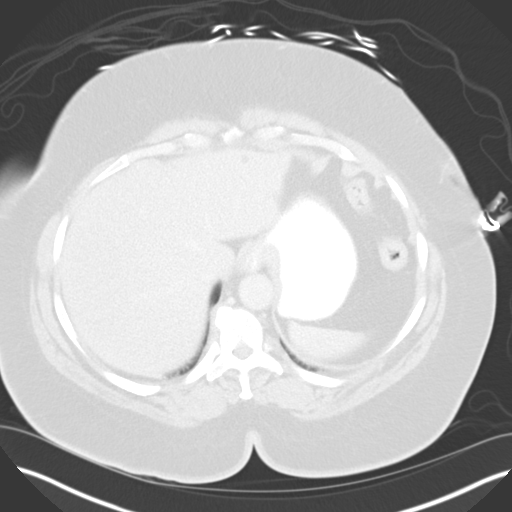
[im 84/90  soft-tissue]
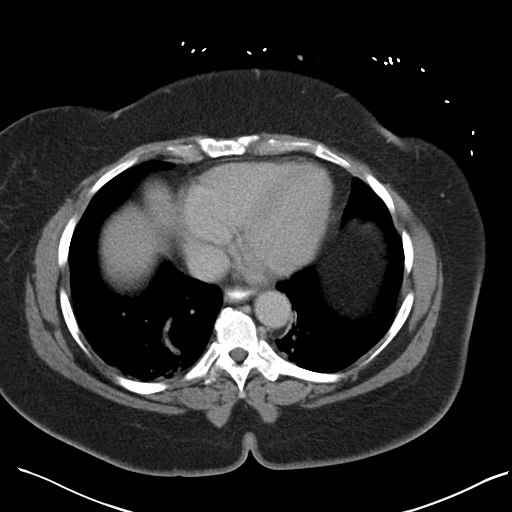
[im 84/90  lung]
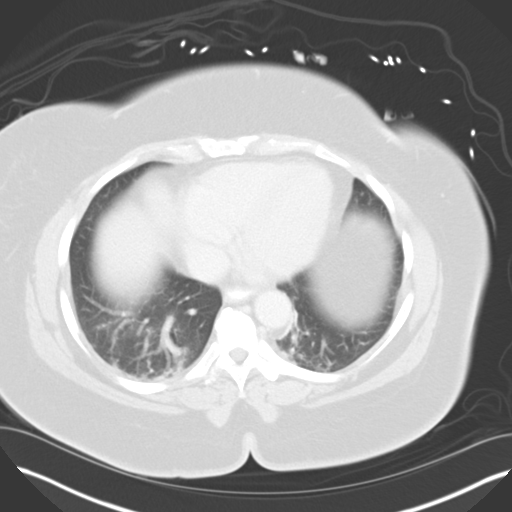

[13 of 32 positions shown; findings below may reference images not displayed]

FINDINGS: Lower chest: The lung bases are clear except for dependent
atelectasis. The heart is mildly enlarged. No pericardial effusion.
The distal esophagus is grossly normal.

Hepatobiliary: No worrisome hepatic lesions. Small cysts are noted.
The gallbladder surgically absent. No common bile duct dilatation.

Pancreas: No inflammatory changes or mass lesion.

Spleen: Normal size.  No focal lesions.

Adrenals/Urinary Tract: Stable right adrenal gland adenoma since
6730. This is a lipid poor adenoma. The left adrenal gland is
normal. The left kidney is normal. The right kidney demonstrates
high-grade obstructive findings with decreased perfusion and marked
perinephric interstitial changes. There is hydroureteronephrosis
without obvious obstructing ureteral calculus. There may be a tiny
calculus in the right ureter on axial image number 50. There is high
attenuation material in the collecting system and upper ureter which
could represent hemorrhage. Tumor cannot be excluded. Recommend
urology consultation and possible retrograde study.

Stomach/Bowel: The stomach, duodenum, small bowel and colon are
unremarkable. No inflammatory changes, mass lesions or obstructive
findings. The appendix is normal.

Vascular/Lymphatic: No mesenteric or retroperitoneal mass or
lymphadenopathy. Small scattered lymph nodes are noted. The aorta is
normal in caliber. Mild tortuosity. The branch vessels are patent.
The major venous structures are patent.

Reproductive: The uterus is surgically absent. Both ovaries are
still present and appear stable. No pelvic mass or lymphadenopathy.
No inguinal mass or adenopathy. The bladder appears normal.

Other: No abdominal wall hernia or subcutaneous lesions.

Musculoskeletal: No significant bony findings. Stable surgical
changes at L4-5.
IMPRESSION: 1. High-grade obstruction of the right kidney as discussed above.
There is a tiny possible ureteral calculus just above the iliac
crest level but I doubt this is causing the obstruction. High
attenuation material in the collecting system and ureter could
reflect blood. Could not exclude tumor. Recommend urology
consultation and possible retrograde study.
2. Stable right adrenal gland lesion.
3. No other significant abdominal/pelvic findings.

## 2016-10-17 ENCOUNTER — Other Ambulatory Visit: Payer: Self-pay

## 2016-10-17 ENCOUNTER — Other Ambulatory Visit (HOSPITAL_BASED_OUTPATIENT_CLINIC_OR_DEPARTMENT_OTHER): Payer: Medicare Other

## 2016-10-17 ENCOUNTER — Telehealth: Payer: Self-pay

## 2016-10-17 DIAGNOSIS — I10 Essential (primary) hypertension: Secondary | ICD-10-CM

## 2016-10-17 DIAGNOSIS — Z7901 Long term (current) use of anticoagulants: Secondary | ICD-10-CM

## 2016-10-17 DIAGNOSIS — Z86718 Personal history of other venous thrombosis and embolism: Secondary | ICD-10-CM | POA: Diagnosis present

## 2016-10-17 LAB — PROTIME-INR
INR: 2 (ref 2.00–3.50)
Protime: 24 Seconds — ABNORMAL HIGH (ref 10.6–13.4)

## 2016-10-17 NOTE — Telephone Encounter (Signed)
Spoke to pt to inform her that MD Benay Spice reviewed her recent lab work and he wants her to stay on the same dose of coumadin and return for repeat lab work in one month. Pt verbalized understanding and agrees with plan of care.

## 2016-10-18 ENCOUNTER — Ambulatory Visit: Payer: Medicare Other | Admitting: Physical Therapy

## 2016-10-19 ENCOUNTER — Encounter: Payer: Self-pay | Admitting: Physical Therapy

## 2016-10-19 ENCOUNTER — Ambulatory Visit: Payer: Medicare Other | Attending: Internal Medicine | Admitting: Physical Therapy

## 2016-10-19 DIAGNOSIS — M25562 Pain in left knee: Secondary | ICD-10-CM

## 2016-10-19 DIAGNOSIS — R293 Abnormal posture: Secondary | ICD-10-CM

## 2016-10-19 DIAGNOSIS — M5440 Lumbago with sciatica, unspecified side: Secondary | ICD-10-CM | POA: Diagnosis present

## 2016-10-19 DIAGNOSIS — M25561 Pain in right knee: Secondary | ICD-10-CM | POA: Insufficient documentation

## 2016-10-19 DIAGNOSIS — R262 Difficulty in walking, not elsewhere classified: Secondary | ICD-10-CM | POA: Diagnosis present

## 2016-10-19 DIAGNOSIS — M6281 Muscle weakness (generalized): Secondary | ICD-10-CM | POA: Insufficient documentation

## 2016-10-19 NOTE — Therapy (Signed)
[place], [place], [address] Phone: [phone]   Fax:  [phone]  Physical Therapy Treatment/Discharge Note  Patient Details  Name: [name] MRN: [id number] Date of Birth: [birth year] Referring Provider: Alphonzo Severance MD  Encounter Date: 10/19/2016      PT End of Session - 10/19/16 0849    Visit Number 6   Date for PT Re-Evaluation 10/26/16   Authorization Type Medicare/Medicaid   Authorization Time Period 10-26-16   PT Start Time 0847   PT Stop Time 0935   PT Time Calculation (min) 48 min   Activity Tolerance Patient tolerated treatment well   Behavior During Therapy [facility] for tasks assessed/performed      Past Medical History:  Diagnosis Date  . Adrenal mass ([place]) 03/226   Benign  . Arthritis    knees/multiple orthopedic conditons; lower back  . Asthma    per pt  . 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 Depression   . Diabetes mellitus    120s usually fasting  . Dizziness, nonspecific   . DVT (deep venous thrombosis) (HCC)    Recurrent  . Fibromyalgia   . GERD (gastroesophageal reflux disease)   . Heart murmur   . History of blood transfusion   . History of cocaine abuse    Remote history   . Hypertension    takes meds daily  . Lupus Anticoagulant Positive   . On home oxygen therapy    at night  . Shortness of breath    exertion or lying flat  . Sleep apnea    2l of oxygen at night    Past Surgical History:  Procedure Laterality Date  . ABDOMINAL HYSTERECTOMY  1989   Fibroids  . ANTERIOR LUMBAR FUSION  01/03/2012   Procedure: ANTERIOR LUMBAR FUSION 1 LEVEL;  Surgeon: Winfield Cunas, MD;  Location: [facility];  Service: Neurosurgery;  Laterality: N/A;  Lumbar Four-Five Anterior Lumbar Interbody Fusion with Instrumentation  . BACK SURGERY     cervical  spine---disk disease  . CARPAL TUNNEL RELEASE     Bilateral  . CESAREAN SECTION    . CYSTOSCOPY/RETROGRADE/URETEROSCOPY/STONE EXTRACTION WITH BASKET Right 04/26/2014   Procedure: CYSTOSCOPY/ RIGHT RETROGRADE/ RIGHT URETEROSCOPY/URETERAL AND RENAL PELVIS BIOPSY;  Surgeon: Alexis Frock, MD;  Location: [facility];  Service: Urology;  Laterality: Right;  . gallstones removed    . 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   bilateral  . right elbow surgery    . TUBAL LIGATION      There were no vitals filed for this visit.      Subjective Assessment - 10/19/16 0850    Subjective I have been doing my exercises at home. I go to my chiropractor this afternoon.    Pertinent History obesity, DM, COPD asthma, bronchitis. Bilatera TKA , back pain    Limitations House hold activities   How long can you sit comfortably? 30 minutes   How long can you stand comfortably? 20-25 mintes   How long can you walk comfortably? 20-25 minutes   Patient Stated Goals Feel better and be able to not use my walker in my apartment   Currently in Pain? No/denies   Pain Score 0-No pain   Pain Score 7   Pain Location Back   Pain Orientation Right   Pain Score 0   Pain Location Knee  Pain Orientation Left   Pain Descriptors / Indicators --  feeling good            [facility] PT Assessment - 10/19/16 0859      Observation/Other Assessments   Focus on Therapeutic Outcomes (FOTO)  FOTO 61%, predicted 46%  liimtation 39%     AROM   Right Knee Flexion 125   Left Knee Flexion 122                     [facility] Adult PT Treatment/Exercise - 10/19/16 0859      Ambulation/Gait   Ambulation Distance (Feet) 200 Feet   Assistive device Straight cane   Gait Pattern Step-through pattern   Ambulation Surface Level   Gait velocity 2.64 ft/sec    Gait Comments instructed in proper gait with cane on level surfaces     Knee/Hip Exercises: Stretches   Passive Hamstring Stretch 3 reps;30 seconds  using sheet around  ball of feet     Knee/Hip Exercises: Aerobic   Nustep 6 minutes L5     Knee/Hip Exercises: Standing   Heel Raises 10 reps   Heel Raises Limitations bil   Lateral Step Up Both;10 reps;Hand Hold: 2;1 set   Lateral Step Up Limitations VC and TC   Forward Step Up Both;1 set;10 reps;Hand Hold: 2;Step Height: 4"   Forward Step Up Limitations VC and TC   Other Standing Knee Exercises standing 4 way SLR bil x 10 for flex, abd, add and ext wit VC and TC for slowing down     Knee/Hip Exercises: Seated   Sit to Sand 10 reps  with chair and frong and VC for slowing down                PT Education - 10/19/16 0911    Education provided Yes   Education Details added to HEP with standing 4 way hip, sit to stand, and step ups for leg strengthening   Person(s) Educated Patient   Methods Explanation;Demonstration;Tactile cues;Verbal cues;Handout   Comprehension Verbalized understanding;Returned demonstration          PT Short Term Goals - 09/21/16 0822      PT SHORT TERM [place] #1   Title STG=LTG           PT Long Term Goals - 10/19/16 0854      PT LONG TERM [place] #1   Title Pt will be independent with advance HEP 10-26-16   Baseline added to HEP today for advanced standing exercises   Time 6   Period Weeks   Status Achieved     PT LONG TERM [place] #2   Title bil  knee AROM flexion will improve to 2-120degrees for improved mobility to ride in car without increased pain 10-26-16   Baseline 0- 125 on right and 0-122 on left   Time 6   Period Weeks   Status Achieved     PT LONG TERM [place] #3   Title Pt will be able to stand for up to 1 hour in order to complete shopping in store without exacerbation of pain in back or knees 10-26-16   Baseline able to stand for 20 -25 minutes for max but progressing, fatigues    Time 6   Period Weeks   Status Partially Met     PT LONG TERM [place] #4   Title "FOTO will improve from  61%limitation    to  46% limitation   indicating improved  functional  mobility. 10-26-16   Baseline FOTO at DC 39% limitation eval was 61%   Time 6   Period Weeks   Status Achieved     PT LONG TERM [place] #5   Title Pt will be able to sleep 4 or more hours at night without waking due to pain while turning in bed for more restorative sleelp 10-26-16   Baseline when I take my medicine I am able to sleep for 6-7 hours   Time 6   Period Weeks   Status Achieved     PT LONG TERM [place] #6   Title Pt will be able to ambulate at 2.62 ft/sec for community ambulation 10-26-16   Baseline pt 2.56f/sec on DC day with cane   Time 6   Period Weeks   Status Achieved               Plan - [id number]    Clinical Impression Statement Pt achieved all goals and is ready for DC with advanced HEP and Dr. DMarlou Sahas cleared her for DC.  Pt is pleased with current progress.  Gait velocity is 2.672fsec with cane.  Pt uses walker in community due to vertigo but is doing well with progressive gai.  Right  knee AROM 0-125 and left 0-122 Pt will be DC due to achieved goals and pt and doctor requeist . Pt is pleased with current level of function. FOTO limtation 39% improved form 61%   Rehab Potential Good   PT Frequency 2x / week   PT Duration 6 weeks   PT Treatment/Interventions ADLs/Self Care Home Management;Cryotherapy;Electrical Stimulation;Iontophoresis '4mg'$ /ml Dexamethasone;Moist Heat;Ultrasound;Gait training;Stair training;Functional mobility training;Patient/family education;Neuromuscular re-education;Therapeutic exercise;Therapeutic activities;Manual techniques;Passive range of motion;Vasopneumatic Device;Taping;Dry needling   PT Next Visit Plan DC   PT Home Exercise Plan Level 1 knee quad set, SAQ LAQ  SLR,  step up, hip SLR standing 3 way, heel lifts and hamstring curls.standing SLR and step ups and sit to stands   Consulted and Agree with Plan of Care Patient      Patient will benefit from skilled therapeutic intervention in order to improve the following  deficits and impairments:  Decreased activity tolerance, Decreased mobility, Decreased range of motion, Decreased strength, Difficulty walking, Obesity, Pain, Improper body mechanics, Postural dysfunction, Increased edema, Increased fascial restricitons, Increased muscle spasms  Visit Diagnosis: Acute pain of left knee  Acute pain of right knee  Difficulty in walking, not elsewhere classified  Muscle weakness (generalized)  Acute bilateral low back pain with sciatica, sciatica laterality unspecified  Abnormal posture       G-Codes - [id number]    Functional Assessment Tool Used (Outpatient Only) FOTO   Functional Limitation Mobility: Walking and moving around   Mobility: Walking and Moving Around Wise Status ([id number] least 40 percent but less than 60 percent impaired, limited or restricted   Mobility: Walking and Moving Around Discharge Status ([phone] least 20 percent but less than 40 percent impaired, limited or restricted  39%      Problem List Patient Active Problem List   Diagnosis Date Noted  . Chronic pain of right knee 05/24/2016  . Effusion, right knee 04/28/2016  . Presence of right artificial knee joint 04/28/2016  . Abnormal LFTs   . Decreased sensation   . Paresthesia 06/12/2014  . Obstruction of kidney 04/24/2014  . Diabetes type 2, uncontrolled ([place]/07/2014  . Chronic anticoagulation 04/24/2014  . Crack cocaine use 04/24/2014  . Depression 04/24/2014  . Fibromyalgia 04/24/2014  .  COPD (chronic obstructive pulmonary disease) ([place]) 04/24/2014  . Obstructive sleep apnea on CPAP 04/24/2014  . Lupus anticoagulant positive 04/24/2014  . Adrenal mass, right ([place]) 04/24/2014  . Right kidney mass 04/24/2014  . Hydronephrosis of right kidney 04/24/2014  . Supratherapeutic INR 04/24/2014  . Renal mass, right 04/24/2014  . Diabetes type 2, controlled ([place])   . Right lower quadrant abdominal pain   . Morbid obesity ([place]) 08/21/2013  . Unspecified  constipation 08/19/2013  . COPD exacerbation ([place]) 08/17/2013  . Morbid obesity with BMI of 45.0-49.9, adult ([place]) 01/05/2012  . Bilateral deep vein thromboses ([place]) 12/13/2011  . Abdominal pain 11/05/2011  . Hypokalemia 11/05/2011  . Sciatica 11/05/2011  . Type II or unspecified type diabetes mellitus without mention of complication, not stated as uncontrolled 11/05/2011  . HTN (hypertension) 11/05/2011    Voncille Lo, PT Certified Exercise Expert for the Aging Adult  10/19/16 9:59 AM Phone: [phone] Fax: [facility] [address] [place], [place], [address] Phone: [phone]   Fax:  [phone]  Name: [name] MRN: [id number] Date of Birth: [birth year]  PHYSICAL THERAPY DISCHARGE SUMMARY  Visits from Start of Care: 6  Current functional level related to goals / functional outcomes: As above   Remaining deficits: Standing only 25 minutes but has vertigo and fatigues  Not limited by knee pain.  0/10   Education / Equipment: HEP,  Personnel officer Plan: Patient agrees to discharge.  Patient goals were met. Patient is being discharged due to meeting the stated rehab goals.  ????? and being pleased with current functional status  Voncille Lo, PT Certified Exercise Expert for the Aging Adult  10/19/16 10:00 AM Phone: [phone] Fax: [phone]

## 2016-10-19 NOTE — Patient Instructions (Addendum)
Knee High   Holding stable object, raise knee to hip level, then lower knee. Repeat with other knee. Complete __10_ repetitions. Do __2__ sessions per day.  ABDUCTION: Standing (Active)   Stand, feet flat. Lift right leg out to side. Use _0__ lbs. Complete __10_ repetitions. Perform __2_ sessions per day.  ADDUCTION: Standing - Stable (Active)   Stand, right leg out to side as far as possible. Draw leg in across midline.  Complete 10_ repetitions. Perform _2__ sessions per day.       EXTENSION: Standing (Active)  Stand, both feet flat. Draw right leg behind body as far as possible. Use Complete 10 repetitions. Perform __2_ sessions per day.     Copyright  VHI. All rights reserved.  Step-Up: Lateral   Step up to side with right leg. Bring other foot up onto _6___ inch step. Return to floor position with left leg. Repeat __10__ times per session. Do __1-2__ sessions per day. Repeat in dimly lit room. Repeat with eyes closed.  Copyright  VHI. All rights reserved.  Forward   Facing step, place one leg on step, flexed at hip. Step up slowly, bringing hips in line with knee and shoulder. Bring other foot onto step. Reverse process to step back down. Repeat with other leg. Do __10__ repetitions, _2___ sets. 1 time a day  http://bt.exer.us/154   Copyright  VHI. All rights reserved.  Functional Quadriceps: Sit to Stand    Sit on edge of chair, feet flat on floor. Stand upright, extending knees fully. Squeeze buttocks  , Have chair in front for safety but dont depend on your arms and pulling up. Push buttock back in to kitchen chair. Not for sofa Repeat 10____ times per set. Do _2___ sets per session. Do ____1 sessions per day.  http://orth.exer.us/735   Copyright  VHI. All rights reserved.   Voncille Lo, PT Certified Exercise Expert for the Aging Adult  10/19/16 9:09 AM Phone: [phone] Fax: [phone]

## 2016-11-22 ENCOUNTER — Ambulatory Visit: Payer: Medicare Other | Admitting: Podiatry

## 2016-11-23 ENCOUNTER — Ambulatory Visit: Payer: Medicare Other | Admitting: Nurse Practitioner

## 2016-11-23 ENCOUNTER — Other Ambulatory Visit: Payer: Medicare Other

## 2016-11-24 ENCOUNTER — Telehealth: Payer: Self-pay

## 2016-11-24 ENCOUNTER — Other Ambulatory Visit: Payer: Medicare Other

## 2016-11-24 ENCOUNTER — Ambulatory Visit: Payer: Medicare Other | Admitting: Nurse Practitioner

## 2016-11-24 NOTE — Telephone Encounter (Signed)
Pt called she forgot her appt yesterday. Pt prefers mornings. Her transportation requires 3 day notice. inbasket sent.

## 2016-11-27 ENCOUNTER — Ambulatory Visit: Payer: Medicare Other | Admitting: Podiatry

## 2016-11-27 ENCOUNTER — Telehealth: Payer: Self-pay | Admitting: Oncology

## 2016-11-27 NOTE — Telephone Encounter (Signed)
Rescheduled her missed appts and spoke with patient regarding this.

## 2016-12-03 IMAGING — MR MR HEAD W/O CM
9 of 10 series · 35 of 48 positions shown · non-contrast
Comparison: Head CT without contrast 5105 hours today.

CLINICAL DATA: 59-year-old female with left side numbness and
weakness for less than 24 hours. Initial encounter.

EXAM:
MRI HEAD WITHOUT CONTRAST
TECHNIQUE: Multiplanar, multiecho pulse sequences of the brain and surrounding
structures were obtained without intravenous contrast.

[Series 5: FLAIR · sagittal · 5.0mm · 0.47mm/px · 3 of 23 slices shown (1 of 2)]
[im 1/23]
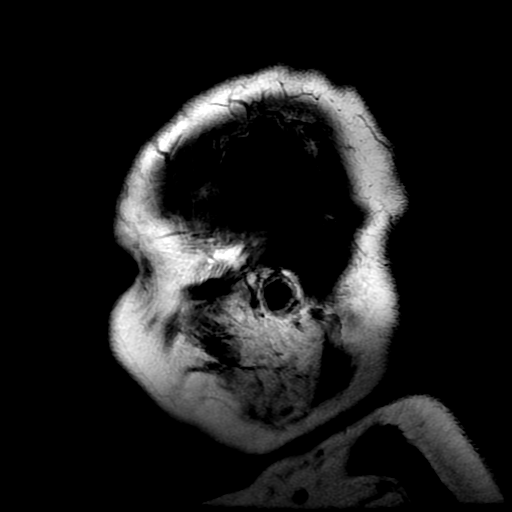
[im 12/23]
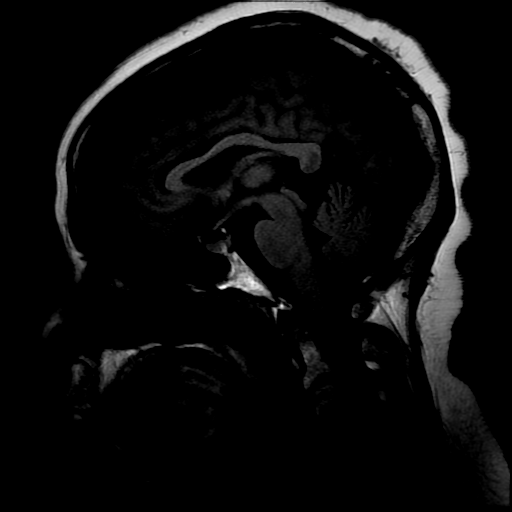
[im 23/23]
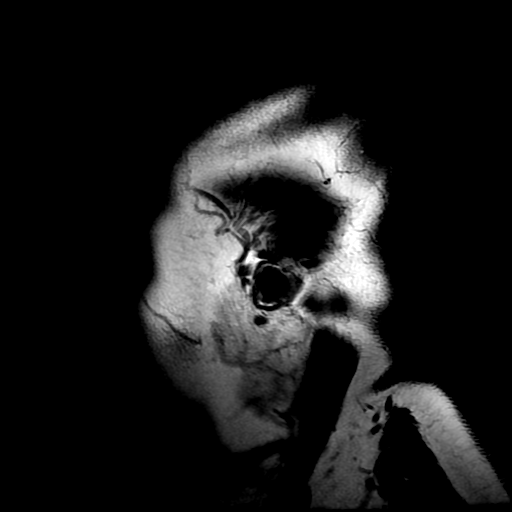

[Series 7: DWI · axial · 3.0mm · 0.94mm/px · z∈[-137,+18]mm · 9 of 108 slices shown (1 of 4)]
[im 1/108]
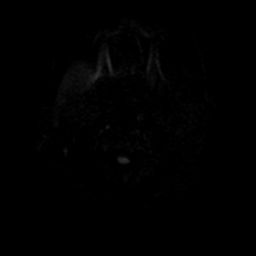
[im 14/108]
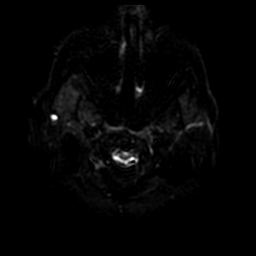
[im 27/108]
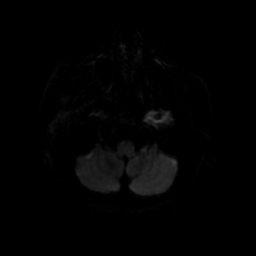
[im 41/108]
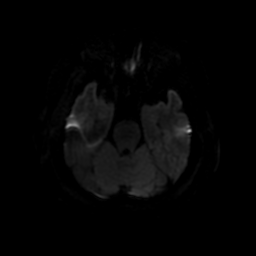
[im 54/108]
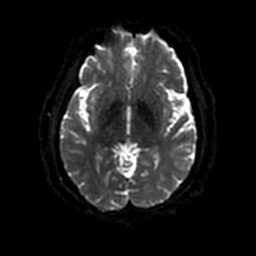
[im 67/108]
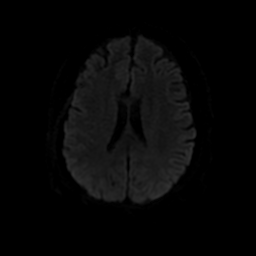
[im 81/108]
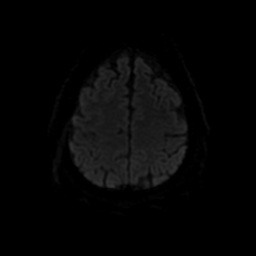
[im 94/108]
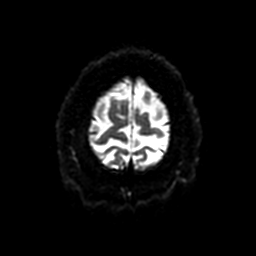
[im 108/108]
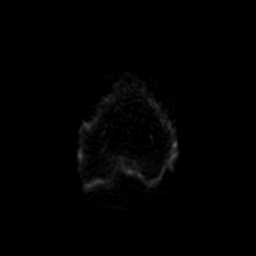

[Series 8: T2 · axial · 5.0mm · 0.47mm/px · z∈[-149,+5]mm · 2 of 28 slices shown (1 of 2)]
[im 1/28]
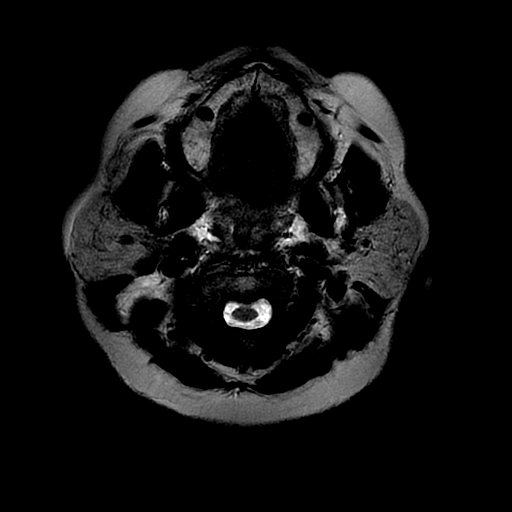
[im 28/28]
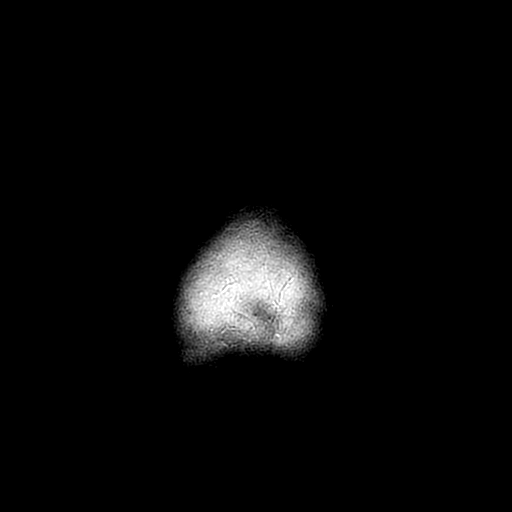

[Series 9: FLAIR · axial · 5.0mm · 0.47mm/px · z∈[-149,+5]mm · 2 of 28 slices shown (2 of 2)]
[im 1/28]
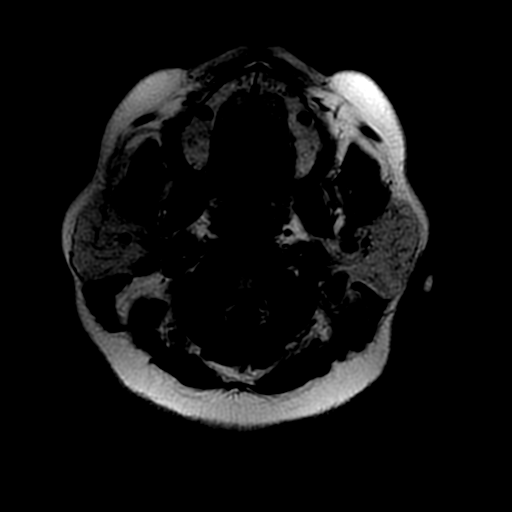
[im 28/28]
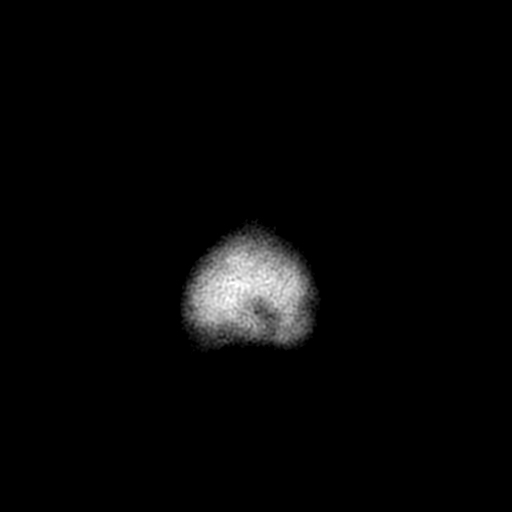

[Series 10: DWI · coronal · 5.0mm · 0.94mm/px · 6 of 72 slices shown (2 of 4)]
[im 1/72]
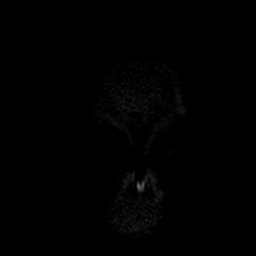
[im 15/72]
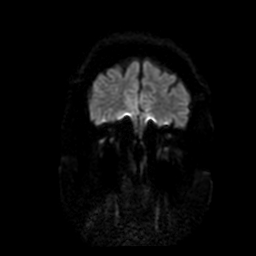
[im 29/72]
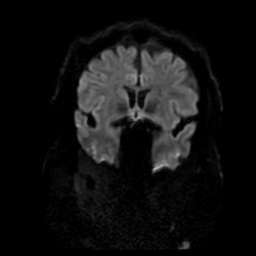
[im 43/72]
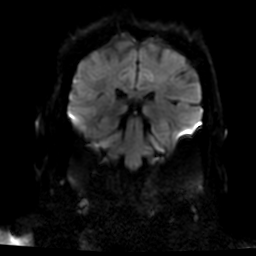
[im 57/72]
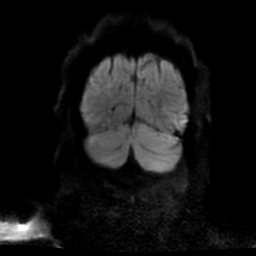
[im 72/72]
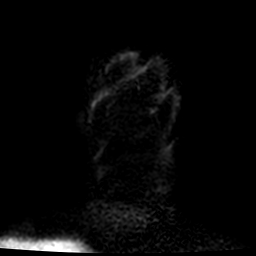

[Series 11: (person_name) · axial · 3.0mm · 0.47mm/px · z∈[-142,-125]mm · 2 of 100 slices shown]
[im 1/100]
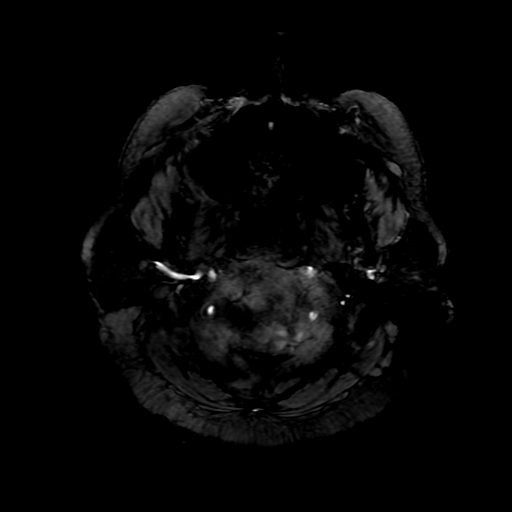
[im 13/100]
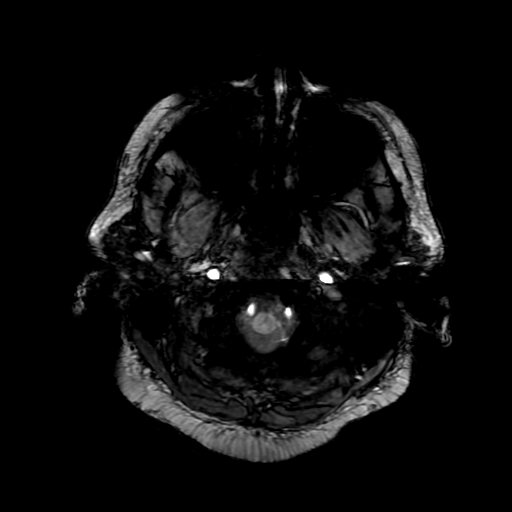

[Series 13: T2 · coronal · 5.0mm · 0.47mm/px · 3 of 29 slices shown (2 of 2)]
[im 1/29]
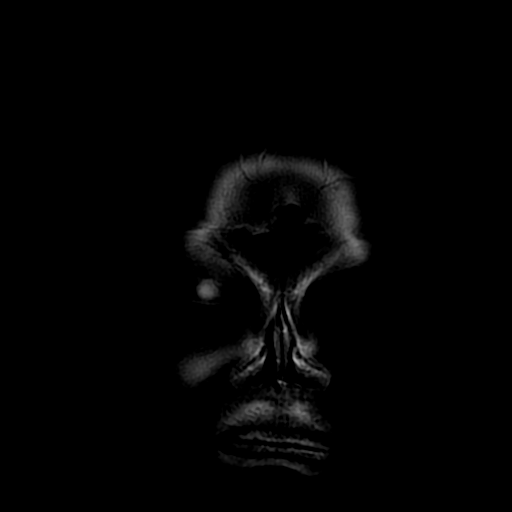
[im 15/29]
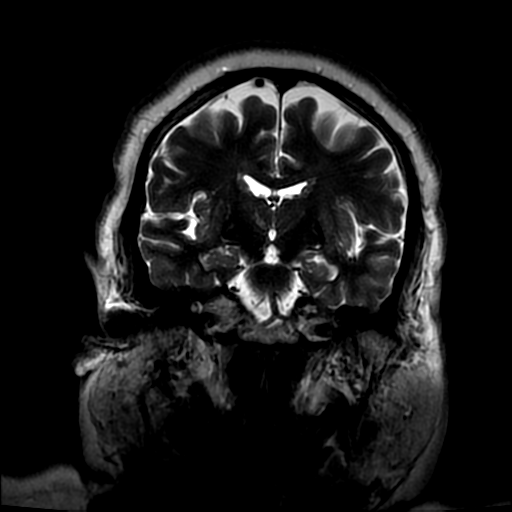
[im 29/29]
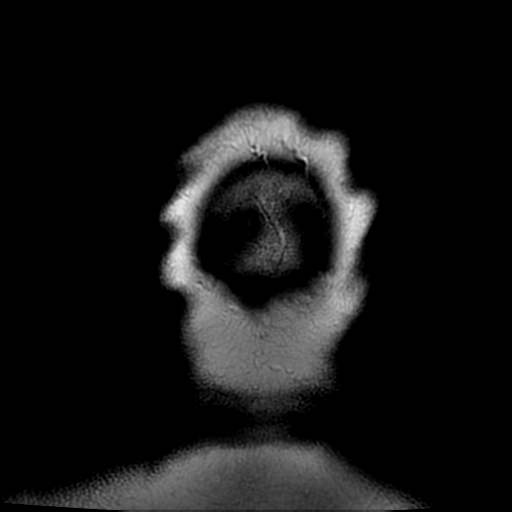

[Series 700: DWI · axial · 3.0mm · 0.94mm/px · z∈[-137,+18]mm · 5 of 54 slices shown (3 of 4)]
[im 1/54]
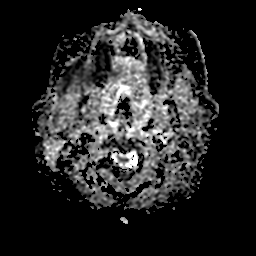
[im 14/54]
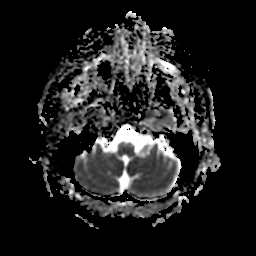
[im 27/54]
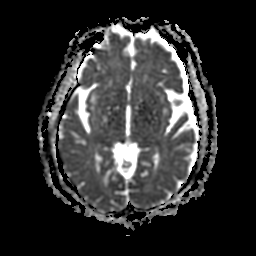
[im 40/54]
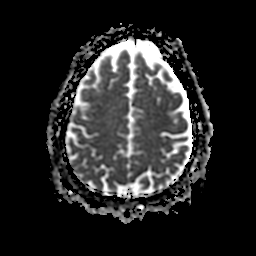
[im 54/54]
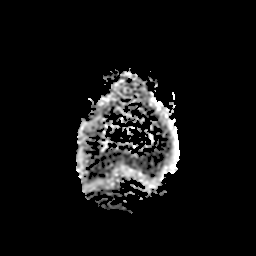

[Series 1000: DWI · coronal · 5.0mm · 0.94mm/px · 3 of 35 slices shown (4 of 4)]
[im 1/35]
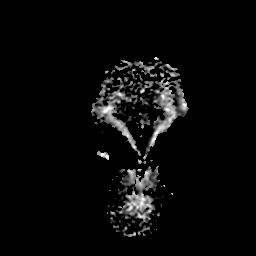
[im 18/35]
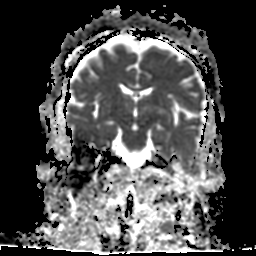
[im 35/35]
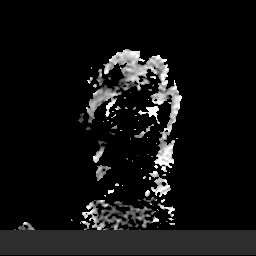

[35 of 48 positions shown; findings below may reference images not displayed]

FINDINGS: Cerebral volume is within normal limits for age. No restricted
diffusion to suggest acute infarction. No midline shift, mass
effect, evidence of mass lesion, ventriculomegaly, extra-axial
collection or acute intracranial hemorrhage. Cervicomedullary
junction and pituitary are within normal limits. Major intracranial
vascular flow voids are preserved.

Scattered cerebral white matter T2 and FLAIR hyperintensity is Mild
for age. There are superimposed dilated perivascular spaces. No
chronic blood products identified. No cortical encephalomalacia
identified.

Visible internal auditory structures appear normal. Visualized
paranasal sinuses and mastoids are clear. Visualized orbit soft
tissues are within normal limits. Visualized scalp soft tissues are
within normal limits. Normal bone marrow signal. Partially visible
cervical ACDF hardware artifact.
IMPRESSION: 1.  No acute intracranial abnormality.
2. Mild for age nonspecific cerebral white matter signal changes.

## 2016-12-04 IMAGING — US US ABDOMEN COMPLETE
1 series · 13 of 25 positions shown · non-contrast
Comparison: April 24, 2014

CLINICAL DATA: Abnormal liver enzymes and vomiting

EXAM:
ULTRASOUND ABDOMEN COMPLETE

[Series 1: us abdomen complete · 0.27mm/px · 13 of 73 slices shown]
[im 1/73]
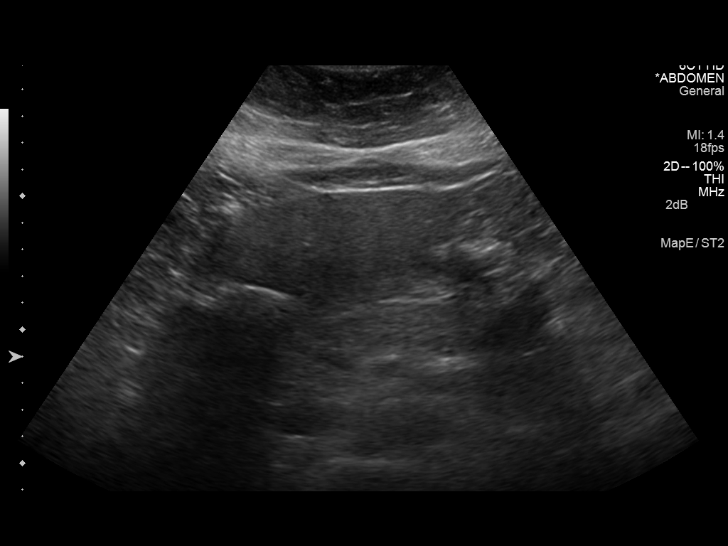
[im 7/73]
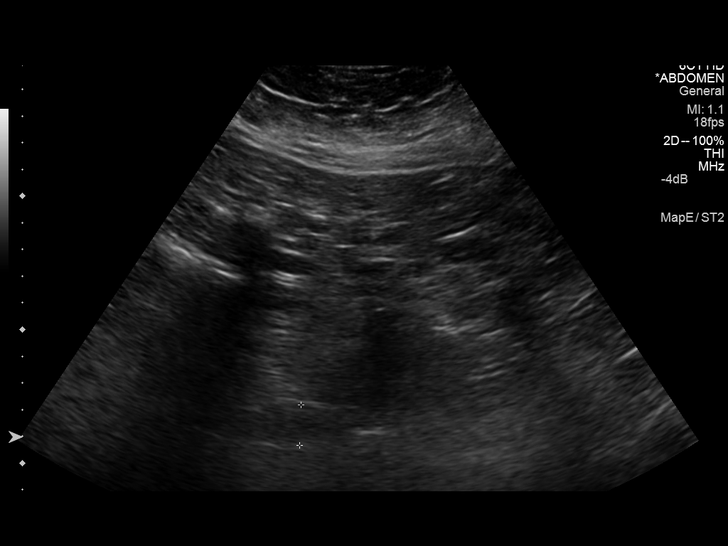
[im 13/73]
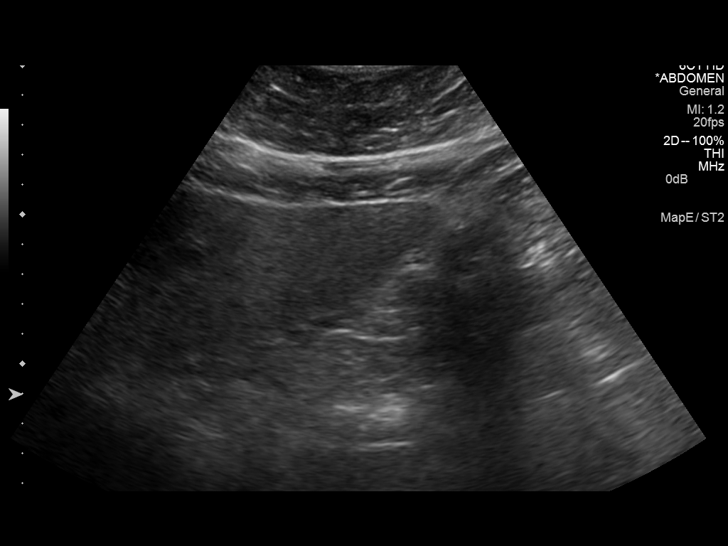
[im 19/73]
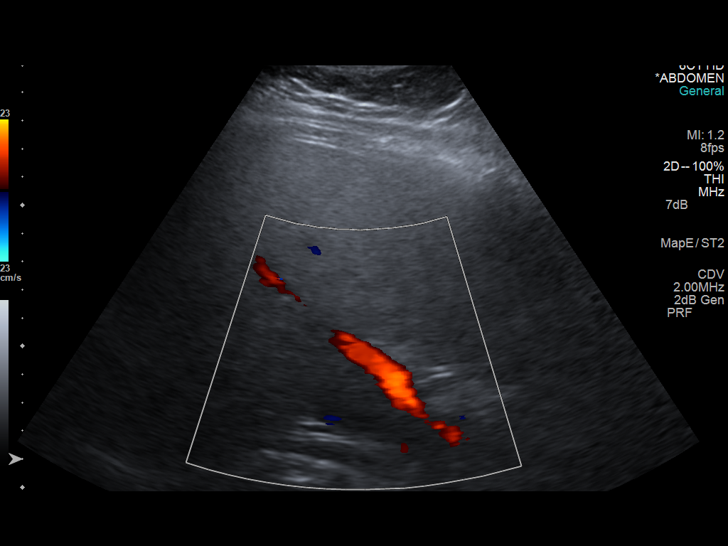
[im 25/73]
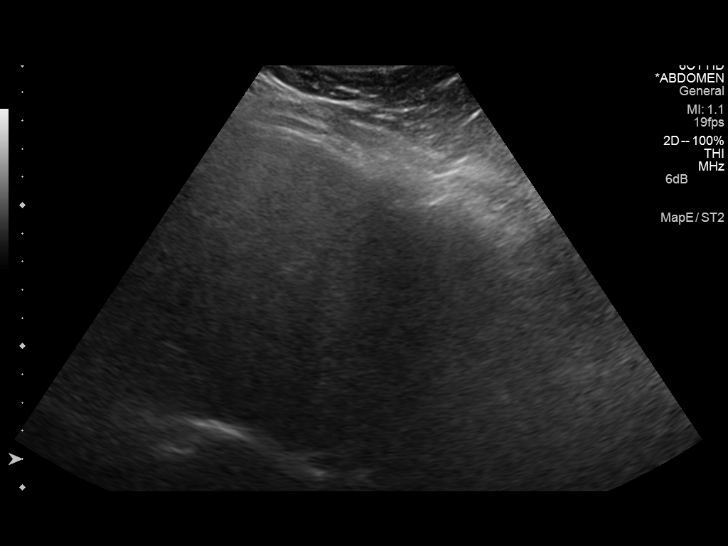
[im 31/73]
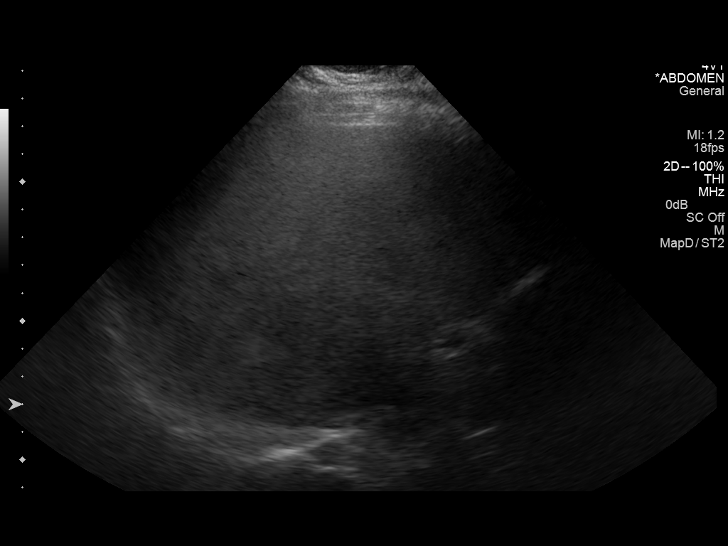
[im 37/73]
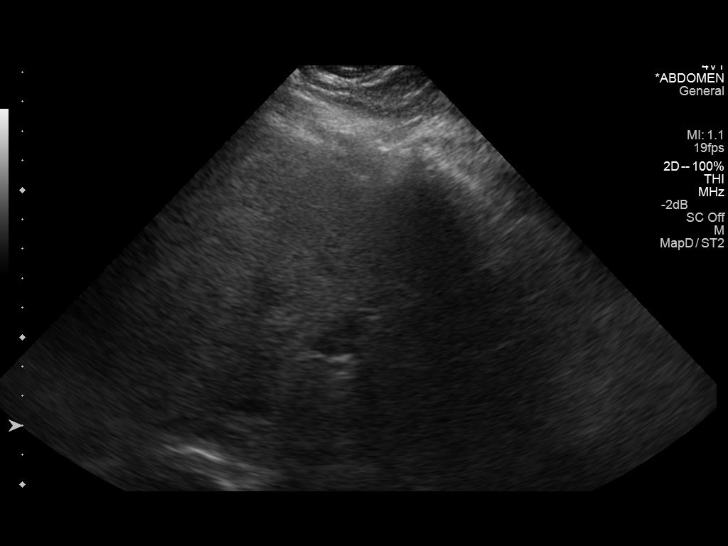
[im 43/73]
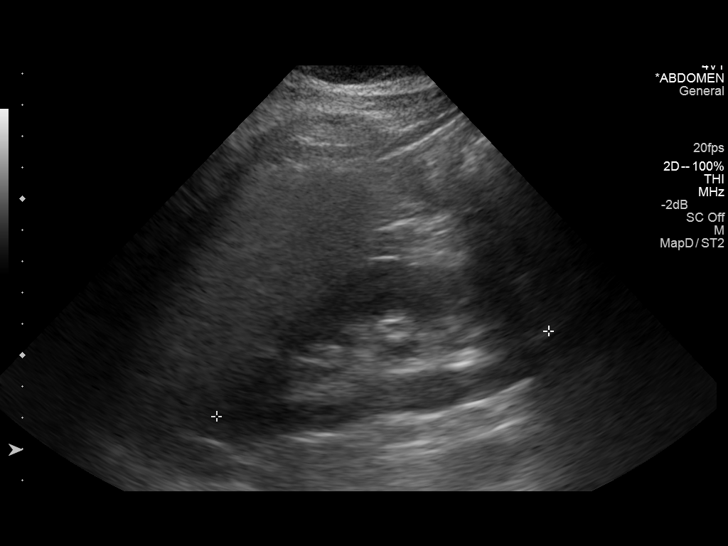
[im 49/73]
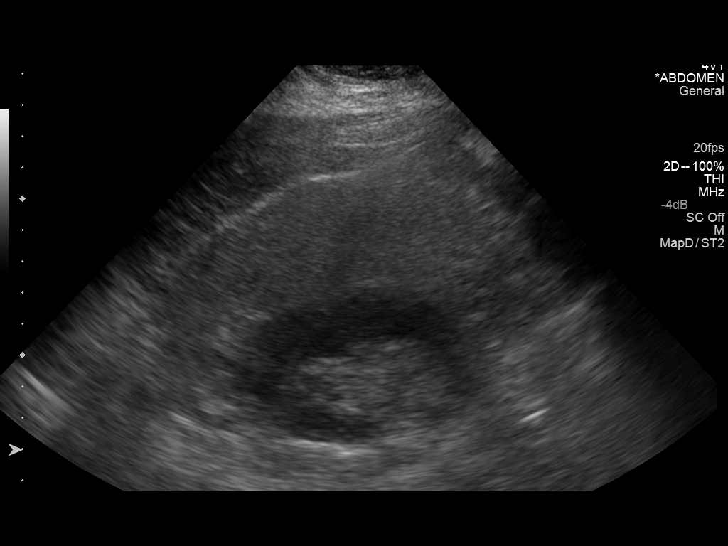
[im 55/73]
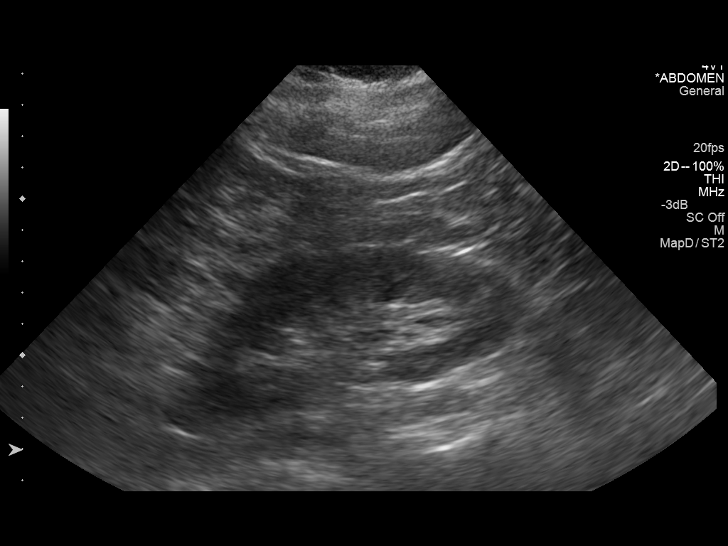
[im 61/73]
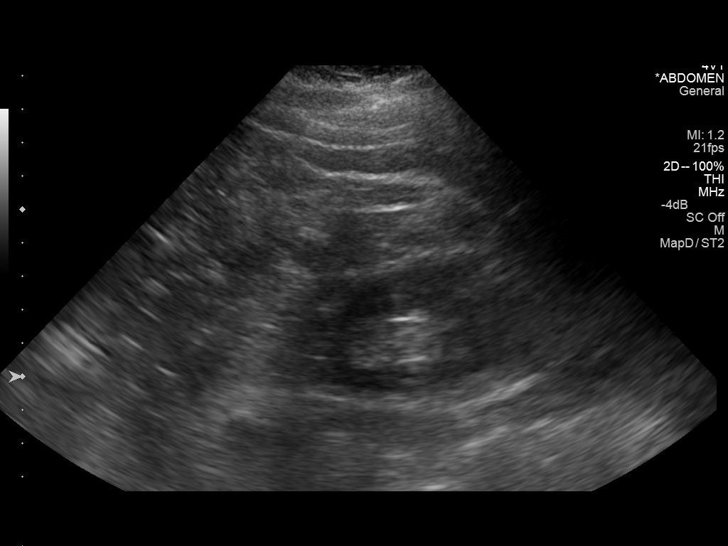
[im 67/73]
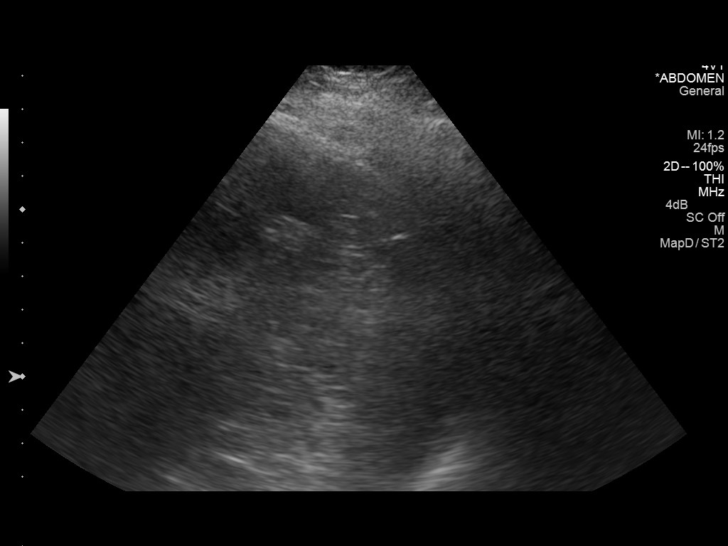
[im 73/73]
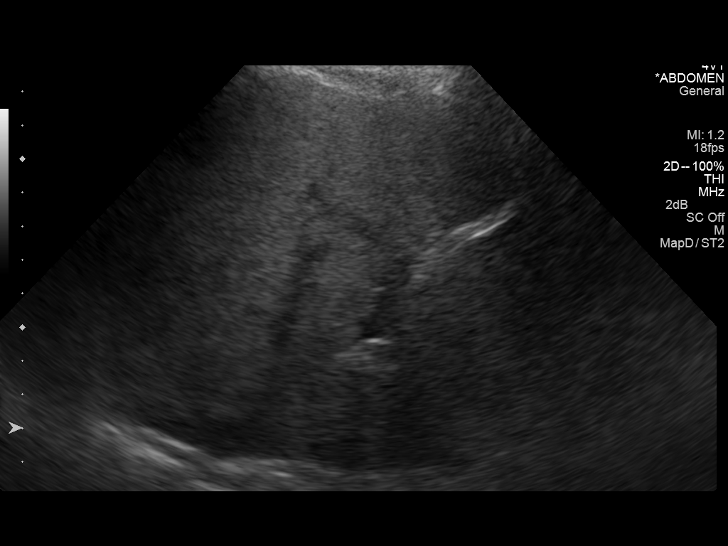

[13 of 25 positions shown; findings below may reference images not displayed]

FINDINGS: Gallbladder: Surgically absent.

Common bile duct: Diameter: 5 mm. There is no intrahepatic, common
hepatic, or common bile duct dilatation.

Liver: No focal lesion identified. Liver echogenicity is diffusely
increased.

IVC: No abnormality visualized.

Pancreas: Visualized portion unremarkable. Portions of pancreas are
obscured by gas.

Spleen: Size and appearance within normal limits.

Right Kidney: Length: 11.0 cm. Echogenicity within normal limits. No
mass or hydronephrosis visualized.

Left Kidney: Length: 10.9 cm. Echogenicity within normal limits. No
mass or hydronephrosis visualized.

Abdominal aorta: No aneurysm visualized.

Other findings: No demonstrable ascites.
IMPRESSION: Gallbladder absent. Diffuse increased liver echogenicity is most
likely indicative of hepatic steatosis. While no focal liver lesions
are identified, it must be cautioned that the sensitivity of
ultrasound for focal liver lesions is diminished in this
circumstance. Portions of pancreas obscured by gas. Visualized
portions of pancreas appear normal. Study otherwise unremarkable.

## 2016-12-07 ENCOUNTER — Ambulatory Visit (HOSPITAL_BASED_OUTPATIENT_CLINIC_OR_DEPARTMENT_OTHER): Payer: Medicare Other | Admitting: Nurse Practitioner

## 2016-12-07 ENCOUNTER — Other Ambulatory Visit (HOSPITAL_BASED_OUTPATIENT_CLINIC_OR_DEPARTMENT_OTHER): Payer: Medicare Other

## 2016-12-07 ENCOUNTER — Telehealth: Payer: Self-pay | Admitting: Oncology

## 2016-12-07 VITALS — BP 130/66 | HR 88 | Temp 98.0°F | Resp 18 | Ht 67.0 in | Wt 276.4 lb

## 2016-12-07 DIAGNOSIS — M545 Low back pain: Secondary | ICD-10-CM

## 2016-12-07 DIAGNOSIS — E041 Nontoxic single thyroid nodule: Secondary | ICD-10-CM | POA: Diagnosis not present

## 2016-12-07 DIAGNOSIS — Z7901 Long term (current) use of anticoagulants: Secondary | ICD-10-CM

## 2016-12-07 DIAGNOSIS — Z86718 Personal history of other venous thrombosis and embolism: Secondary | ICD-10-CM

## 2016-12-07 DIAGNOSIS — I82403 Acute embolism and thrombosis of unspecified deep veins of lower extremity, bilateral: Secondary | ICD-10-CM

## 2016-12-07 DIAGNOSIS — R760 Raised antibody titer: Secondary | ICD-10-CM

## 2016-12-07 LAB — CBC WITH DIFFERENTIAL/PLATELET
BASO%: 0.2 % (ref 0.0–2.0)
Basophils Absolute: 0 10*3/uL (ref 0.0–0.1)
EOS%: 2.4 % (ref 0.0–7.0)
Eosinophils Absolute: 0.1 10*3/uL (ref 0.0–0.5)
HCT: 35.2 % (ref 34.8–46.6)
HGB: 11.3 g/dL — ABNORMAL LOW (ref 11.6–15.9)
LYMPH%: 34.1 % (ref 14.0–49.7)
MCH: 26.7 pg (ref 25.1–34.0)
MCHC: 32.1 g/dL (ref 31.5–36.0)
MCV: 83 fL (ref 79.5–101.0)
MONO#: 0.4 10*3/uL (ref 0.1–0.9)
MONO%: 6.6 % (ref 0.0–14.0)
NEUT#: 3.1 10*3/uL (ref 1.5–6.5)
NEUT%: 56.7 % (ref 38.4–76.8)
Platelets: 313 10*3/uL (ref 145–400)
RBC: 4.24 10*6/uL (ref 3.70–5.45)
RDW: 17.9 % — ABNORMAL HIGH (ref 11.2–14.5)
WBC: 5.5 10*3/uL (ref 3.9–10.3)
lymph#: 1.9 10*3/uL (ref 0.9–3.3)
nRBC: 0 % (ref 0–0)

## 2016-12-07 LAB — PROTIME-INR
INR: 1.7 — ABNORMAL LOW (ref 2.00–3.50)
Protime: 20.4 Seconds — ABNORMAL HIGH (ref 10.6–13.4)

## 2016-12-07 NOTE — Telephone Encounter (Signed)
Scheduled appt per 9/20 los - Gave patient AVS and calender per los.  

## 2016-12-07 NOTE — Progress Notes (Signed)
  [place] OFFICE PROGRESS NOTE   Diagnosis:  Chronic anticoagulation  INTERVAL HISTORY:   [name] returns for follow-up. She continues Coumadin 10 mg 2 days a week and 5 mg the other days. She denies any bleeding. She takes hydrocodone for chronic back pain. She reports having a "cyst" on her thyroid. She has been referred to surgery.  Objective:  Vital signs in last 24 hours:  Blood pressure 130/66, pulse 88, temperature 98 F (36.7 C), temperature source Oral, resp. rate 18, height 5\' 7"  (1.702 m), weight 276 lb 6.4 oz (125.4 kg), SpO2 100 %.   Resp: Lungs clear bilaterally. Cardio: Regular rate and rhythm. GI: Abdomen soft and nontender. No hepatosplenomegaly. Vascular: No leg edema.  Lab Results:  Lab Results  Component Value Date   WBC 5.5 12/07/2016   HGB 11.3 (L) 12/07/2016   HCT 35.2 12/07/2016   MCV 83.0 12/07/2016   PLT 313 12/07/2016   NEUTROABS 3.1 12/07/2016    Imaging:  No results found.  Medications: I have reviewed the patient's current medications.  Assessment/Plan: 1. History of bilateral lower extremity deep vein thrombosis. She continues Coumadin anticoagulation. 2. History of a positive lupus anticoagulant. 3. History of a positive beta-2-glycoprotein IgA antibody. 4. Chronic bilateral leg pain - negative bilateral Doppler January 2010. 5. History of hypertension. 6. COPD. 7. Status post hysterectomy. 8. Remote history of cocaine abuse. 9. History of an adrenal lesion on a CT scan in 2008 - status post a repeat CT on June 16, 2009, showing slow enlargement of the right adrenal nodule since 2006, most consistent with an adenoma. Stable on a CT 04/24/2014 10. History of rectal and vaginal bleeding in May 2009.  11. Status post carpal tunnel surgery. 12. 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 Right knee replacement September 2011. 13. Multiple orthopedic conditions. 14. Placement of IVC filter preoperatively before knee  replacement. The IVC filter was retrieved 03/01/2011. 15. Anemia. Ferritin in normal range at 82 on 08/18/2013; 61 on 07/10/2016 16. Status post lumbar fusion surgery October 2013 17. Right hydronephrosis-evaluated by Dr. Tresa Moore 18. Left ankle swelling/pain 07/24/2016    Disposition: Ms. Serpe appears unchanged. She will continue Coumadin at the current dose. We will repeat the PT/INR in one month.  She has an upcoming appointment with surgery regarding a thyroid cyst. She understands to alert the surgeon that she takes Coumadin and to contact Dr. Benay Spice with any questions regarding anticoagulation.  We will see her in follow-up in 4 months.  Plan reviewed with Dr. Benay Spice.  Ned Card ANP/GNP-BC   12/07/2016  9:44 AM

## 2016-12-11 ENCOUNTER — Ambulatory Visit (INDEPENDENT_AMBULATORY_CARE_PROVIDER_SITE_OTHER): Payer: Medicare Other | Admitting: Podiatry

## 2016-12-11 ENCOUNTER — Encounter: Payer: Self-pay | Admitting: Podiatry

## 2016-12-11 DIAGNOSIS — M79676 Pain in unspecified toe(s): Secondary | ICD-10-CM | POA: Diagnosis not present

## 2016-12-11 DIAGNOSIS — B351 Tinea unguium: Secondary | ICD-10-CM | POA: Diagnosis not present

## 2016-12-11 DIAGNOSIS — E119 Type 2 diabetes mellitus without complications: Secondary | ICD-10-CM

## 2016-12-11 NOTE — Patient Instructions (Signed)

## 2016-12-11 NOTE — Progress Notes (Signed)
Patient ID: [name], female   DOB: 07-21-1955, 61 y.o.   MRN: [id number]    Subjective: This patient presents today complaining of thickened and elongated toenails which are uncomfortable walking wearing shoes and requests toenail debridement.  Objective:  Orientated 3  Vascular: No peripheral edema noted bilaterally DP and PT pulses 2/4 bilaterally Capillary reflex immediate bilaterally  Neurological: Sensation to 10 g monofilament wire intact 5/5 bilaterally Vibratory sensation reactive bilaterally Ankle reflex equal and reactive bilaterally  Dermatological: Dry skin bilaterally No open skin lesions noted bilaterally The toenails are elongated, brittle, discolored, incurvated and tender direct palpation 6-10 Absent hair growth bilaterally  Musculoskeletal: Patient using roller walker There is no restriction ankle, subtalar, midtarsal joints bilaterally No deformities noted bilaterally       Assessment: Diabetic without complication Symptomatic onychomycoses 6-10  Plan: Debridement toenails 10 mechanicallyand electrically without any bleeding  Reappoint 96months

## 2016-12-15 ENCOUNTER — Ambulatory Visit: Payer: Self-pay | Admitting: Surgery

## 2016-12-15 NOTE — H&P (Signed)
[name] 12/15/2016 9:43 AM Location: [facility] Patient #: [id number] DOB: Oct 16, 1955 Divorced / Language: English / Race: Black or African American Female  History of Present Illness Colleen Fus A. [name] MD; 12/15/2016 10:44 AM) Patient words: The patient said request of Dr. Tyson Dense for right thyroid nodule. Patient is a history of right thyroid gland nodule dating back 2010. She underwent FNA which showed a benign appearing lesion at that time. She is to call rectus muscle and CT of her neck showed a 3 cm right thyroid nodule. Dating back to ultrasound from 2010 this was 2 cm at that time. Patient does relate some difficulty swallowing. She is on anticoagulation for chronic DVT T. Patient denies any weight changes or other changes to her hair or nails. Ultrasound was done which showed this to be close to 4 cm by ultrasound criteria. This is involving her right thyroid lobe and [facility]. There is no adenopathy.  The patient is a 61 year old female.   Past Surgical History ([name], RMA; 12/15/2016 9:43 AM) Knee Surgery Bilateral.  Diagnostic Studies History ([name], RMA; 12/15/2016 9:43 AM) Colonoscopy 1-5 years ago Mammogram 1-3 years ago  Allergies ([name], RMA; 12/15/2016 9:44 AM) No Known Drug Allergies 12/15/2016 Allergies Reconciled  Medication History ([name], RMA; 12/15/2016 9:47 AM) [name] XT (240MG  Capsule ER 24HR, Oral) Active. MetFORMIN HCl (500MG  Tablet, Oral) Active. Omeprazole (20MG  Capsule DR, Oral) Active. TiZANidine HCl (4MG  Tablet, Oral) Active. Ventolin HFA (108 (90 Base)MCG/ACT Aerosol Soln, Inhalation) Active. Warfarin Sodium (10MG  Tablet, Oral) Active. QUEtiapine Fumarate (50MG  Tablet, Oral) Active. Albuterol (90MCG/ACT Aerosol Soln, Inhalation) Active. AmLODIPine Besylate (5MG  Tablet, Oral) Active. CloNIDine HCl (0.1MG  Tablet, Oral) Active. Diclofenac Potassium  (50MG  Tablet, Oral) Active. Medications Reconciled  Family History ([name], RMA; 12/15/2016 9:43 AM) Alcohol Abuse Brother, Mother. Diabetes Mellitus Mother. Hypertension Mother. Malignant Neoplasm Of Pancreas Mother. Thyroid problems Sister.  Pregnancy / Birth History ([name], RMA; 12/15/2016 9:43 AM) Age at menarche 12 years. Age of menopause 59-50 Gravida 4 Maternal age 35-20 Para 3  Other Problems ([name], RMA; 12/15/2016 9:43 AM) Anxiety Disorder Arthritis Asthma Back Pain Chest pain Cholelithiasis Chronic Obstructive Lung Disease Depression Diabetes Mellitus Gastroesophageal Reflux Disease Heart murmur High blood pressure Hypercholesterolemia     Review of Systems (Colleen A. Brown RMA; 12/15/2016 9:43 AM) General Present- Weight Gain. Not Present- Appetite Loss, Chills, Fatigue, Fever, Night Sweats and Weight Loss. HEENT Present- Wears glasses/contact lenses. Not Present- Earache, Hearing Loss, Hoarseness, Nose Bleed, Oral Ulcers, Ringing in the Ears, Seasonal Allergies, Sinus Pain, Sore Throat, Visual Disturbances and Yellow Eyes. Respiratory Not Present- Bloody sputum, Chronic Cough, Difficulty Breathing, Snoring and Wheezing. Cardiovascular Present- Shortness of Breath. Not Present- Chest Pain, Difficulty Breathing Lying Down, Leg Cramps, Palpitations, Rapid Heart Rate and Swelling of Extremities. Female Genitourinary Not Present- Frequency, Nocturia, Painful Urination, Pelvic Pain and Urgency. Musculoskeletal Present- Back Pain. Not Present- Joint Pain, Joint Stiffness, Muscle Pain, Muscle Weakness and Swelling of Extremities. Psychiatric Present- Depression. Not Present- Anxiety, Bipolar, Change in Sleep Pattern, Fearful and Frequent crying. Endocrine Present- Hot flashes. Not Present- Cold Intolerance, Excessive Hunger, Hair Changes, Heat Intolerance and New Diabetes. Hematology Present- Blood Thinners. Not  Present- Easy Bruising, Excessive bleeding, Gland problems, HIV and Persistent Infections.  Vitals (Colleen A. Brown RMA; 12/15/2016 9:44 AM) 12/15/2016 9:44 AM Weight: 273.8 lb Height: 67in Body Surface Area: 2.31 m Body Mass Index: 42.88 kg/m  Temp.: 98.29F  Pulse: 87 (Regular)  BP: 142/82 (Sitting, Left Arm, Standard)      Physical Exam ([name] A. Colleen Bullen MD; 12/15/2016 10:45 AM)  General Mental Status-Alert. General Appearance-Consistent with stated age. Hydration-Well hydrated. Voice-Normal.  Head and Neck Note: Right thyroid nodule noted. This is about 3-4 cm in size involving the right thyroid gland. No cervical adenopathy. Voice is normal and trachea is midline. Left thyroid lobe feels small. No masses.  Chest and Lung Exam Chest and lung exam reveals -quiet, even and easy respiratory effort with no use of accessory muscles and on auscultation, normal breath sounds, no adventitious sounds and normal vocal resonance. Inspection Chest Wall - Normal. Back - normal.  Cardiovascular Cardiovascular examination reveals -normal heart sounds, regular rate and rhythm with no murmurs and normal pedal pulses bilaterally.  Abdomen Note: Obese soft  Neurologic Note: Patient walks with the assistance of a cane.  Lymphatic Head & Neck  General Head & Neck Lymphatics: Bilateral - Description - Normal.    Assessment & Plan ([name] A. Colleen Harb MD; 12/15/2016 10:46 AM)  THYROID MASS (E07.9) Impression: needs FNA more recommendations once this is done Discussed the pros and cons of thyroidectomy. I told her we need more information to determine if surgery is necessary or not. Talked about the pros and cons of surgery as well as recovery. Discussed potential risk case which are higher than baseline due to her multiple medical comorbidities.  Current Plans The anatomy and physiology of the thyroid gland and organs of the neck were discussed.  Pathophysiology of thyroid problems were discussed. Options were discussed, and I made a recommendation to remove part (and possibly all) of the thyroid gland to treat the pathology.  Risks of bleeding, infection, injury to other organs including nerves, recurrent laryngeal nerve injury with resultant airway compromise and/or hoarseness, new onset of low calcium which can be permanent, reoperation, death, and other risks were discussed. I noted a good likelihood this will help address the problem. While there are risks, I feel the risks of nonoperative management are greater; therefore, I feel surgery offers the best option. Educational material was given to help further explain the topics & concerns from our discussion. We will work to minimize complications.  You are being scheduled for surgery- Our schedulers will call you.  You should hear from our office's scheduling department within 5 working days about the location, date, and time of surgery. We try to make accommodations for patient's preferences in scheduling surgery, but sometimes the OR schedule or the surgeon's schedule prevents [place] from making those accommodations.  If you have not heard from our office [phone]) in 5 working days, call the office and ask for your surgeon's nurse.  If you have other questions about your diagnosis, plan, or surgery, call the office and ask for your surgeon's nurse.  Pt Education - Pamphlet Given - The Thyroid Book: discussed with patient and provided information. Pt Education - [facility] Thyroid/ Parathyroid HCI

## 2016-12-27 ENCOUNTER — Other Ambulatory Visit: Payer: Self-pay | Admitting: Surgery

## 2016-12-27 DIAGNOSIS — E079 Disorder of thyroid, unspecified: Secondary | ICD-10-CM

## 2017-01-01 ENCOUNTER — Other Ambulatory Visit: Payer: Self-pay | Admitting: Surgery

## 2017-01-01 ENCOUNTER — Ambulatory Visit
Admission: RE | Admit: 2017-01-01 | Discharge: 2017-01-01 | Disposition: A | Discharge status: Home / Self Care | Payer: Self-pay | Source: Ambulatory Visit | Attending: Surgery | Admitting: Surgery

## 2017-01-01 DIAGNOSIS — E079 Disorder of thyroid, unspecified: Secondary | ICD-10-CM

## 2017-01-02 ENCOUNTER — Telehealth: Payer: Self-pay

## 2017-01-02 NOTE — Telephone Encounter (Signed)
Arena from [facility] called that another MD is ordering thyroid biopsy. Dr Benay Spice put pt on warfarin. [facility] needs OK from Dr Benay Spice to take pt off warfarin for 4 days prior to bx.  Fax # [phone].   Arena's number is [phone] .

## 2017-01-03 NOTE — Telephone Encounter (Signed)
Ok, resume day of biopsy, Pt here 1 week after biopsy

## 2017-01-04 ENCOUNTER — Telehealth: Payer: Self-pay | Admitting: *Deleted

## 2017-01-04 ENCOUNTER — Other Ambulatory Visit (HOSPITAL_BASED_OUTPATIENT_CLINIC_OR_DEPARTMENT_OTHER): Payer: Medicare Other

## 2017-01-04 DIAGNOSIS — Z86718 Personal history of other venous thrombosis and embolism: Secondary | ICD-10-CM | POA: Diagnosis present

## 2017-01-04 DIAGNOSIS — Z7901 Long term (current) use of anticoagulants: Secondary | ICD-10-CM

## 2017-01-04 LAB — PROTIME-INR
INR: 2 (ref 2.00–3.50)
Protime: 24 Seconds — ABNORMAL HIGH (ref 10.6–13.4)

## 2017-01-04 NOTE — Telephone Encounter (Signed)
Spoke with patient. Per Dr Gearldine Shown memo  - continue same dose and check patient  11/15 or 1 wk after biopsy. Patient verified coumadin dose.

## 2017-01-04 NOTE — Telephone Encounter (Signed)
Late entry for 10/17: Faxed note to [facility] with Dr. Gearldine Shown instructions below.

## 2017-01-23 ENCOUNTER — Ambulatory Visit
Admission: RE | Admit: 2017-01-23 | Discharge: 2017-01-23 | Disposition: A | Discharge status: Home / Self Care | Payer: Medicare Other | Source: Ambulatory Visit | Attending: Surgery | Admitting: Surgery

## 2017-01-23 ENCOUNTER — Other Ambulatory Visit (HOSPITAL_COMMUNITY)
Admission: RE | Admit: 2017-01-23 | Discharge: 2017-01-23 | Disposition: A | Discharge status: Home / Self Care | Payer: Medicare Other | Source: Ambulatory Visit | Attending: Surgery | Admitting: Surgery

## 2017-01-23 DIAGNOSIS — E079 Disorder of thyroid, unspecified: Secondary | ICD-10-CM

## 2017-01-23 DIAGNOSIS — E041 Nontoxic single thyroid nodule: Secondary | ICD-10-CM | POA: Diagnosis present

## 2017-02-01 ENCOUNTER — Telehealth: Payer: Self-pay | Admitting: *Deleted

## 2017-02-01 ENCOUNTER — Other Ambulatory Visit: Payer: Medicare Other

## 2017-02-01 NOTE — Telephone Encounter (Signed)
Late entry. "I forgot I had an appointment today.  Use [employer] [facility] [employer], it's too late to request ride.  Surgery next Monday."  Call Transferred.

## 2017-02-01 NOTE — Telephone Encounter (Signed)
Ok, let us know if surgery needed and we can hold coumadin

## 2017-02-01 NOTE — Telephone Encounter (Signed)
Returned call to pt, she reports there were abnormal cells on her thyroid biopsy. She is scheduled to see the surgeon 11/19 to discuss results. Pt wanted to make MD aware that she may need surgery. Message to MD.

## 2017-02-05 ENCOUNTER — Ambulatory Visit: Payer: Self-pay | Admitting: Surgery

## 2017-02-05 NOTE — H&P (View-Only) (Signed)
[name] 02/05/2017 10:51 AM Location: [facility] Patient #: [id number] DOB: Aug 05, 1955 Divorced / Language: English / Race: Black or African American Female  History of Present Illness [name] Welcome MD; 02/05/2017 11:21 AM) Patient words: Patient returns for follow-up of her right thyroid mass. FNA was performed which showed a Bethesda grade 3 finding with atypical follicular cells.  Patient denies any new complaints.                            INDICATION: Indeterminate thyroid nodule of the right mid thyroid. Request is made for fine-needle aspiration of indeterminate thyroid nodule.  EXAM: ULTRASOUND GUIDED FINE NEEDLE ASPIRATION OF INDETERMINATE THYROID NODULE  COMPARISON: US SOFT TISSUE NECK 01/01/2017, from outside hospital  MEDICATIONS: 2 mL 1% lidocaine  COMPLICATIONS: None immediate.  TECHNIQUE: Informed written consent was obtained from the patient after a discussion of the risks, benefits and alternatives to treatment. Questions regarding the procedure were encouraged and answered. A timeout was performed prior to the initiation of the procedure.  Pre-procedural ultrasound scanning demonstrated right mid thyroid nodule unchanged in appearance.  The procedure was planned. The neck was prepped in the usual sterile fashion, and a sterile drape was applied covering the operative field. A timeout was performed prior to the initiation of the procedure. Local anesthesia was provided with 1% lidocaine.  Under direct ultrasound guidance, 4 FNA biopsies were performed of the right mid thyroid nodule with a 27 gauge needle. The samples were prepared and submitted to pathology.  Limited post procedural scanning was negative for hematoma or additional complication. Dressings were placed. The patient tolerated the above procedures procedure well without immediate postprocedural complication.  IMPRESSION: Technically  successful ultrasound guided fine needle aspiration of indeterminate right mid thyroid nodule.   Electronically Signed By: Jolaine Click M.D. On: 01/23/2017 16:47                    Diagnosis THYROID, FINE NEEDLE ASPIRATION RLP (SPECIMEN 1 OF 1, COLLECTED ON 01/23/2017): ATYPIA OF UNDETERMINED SIGNIFICANCE OR FOLLICULAR LESION OF UNDETERMINED SIGNIFICANCE (BETHESDA CATEGORY III). Valinda Hoar MD Pathologist, Electronic Signature.  The patient is a 61 year old female.   Allergies ([name], RMA; 02/05/2017 10:51 AM) No Known Drug Allergies 12/15/2016 Allergies Reconciled  Medication History ([name], RMA; 02/05/2017 10:51 AM) [name] XT (240MG  Capsule ER 24HR, Oral) Active. MetFORMIN HCl (500MG  Tablet, Oral) Active. Omeprazole (20MG  Capsule DR, Oral) Active. TiZANidine HCl (4MG  Tablet, Oral) Active. Ventolin HFA (108 (90 Base)MCG/ACT Aerosol Soln, Inhalation) Active. Warfarin Sodium (10MG  Tablet, Oral) Active. QUEtiapine Fumarate (50MG  Tablet, Oral) Active. Albuterol (90MCG/ACT Aerosol Soln, Inhalation) Active. AmLODIPine Besylate (5MG  Tablet, Oral) Active. CloNIDine HCl (0.1MG  Tablet, Oral) Active. Diclofenac Potassium (50MG  Tablet, Oral) Active. Medications Reconciled    Vitals (Tanisha A. Brown RMA; 02/05/2017 10:51 AM) 02/05/2017 10:51 AM Weight: 270.6 lb Height: 67in Body Surface Area: 2.3 m Body Mass Index: 42.38 kg/m  Temp.: 97.52F  Pulse: 100 (Regular)  BP: 144/82 (Sitting, Left Arm, Standard)      Physical Exam (Dajsha Massaro A. Kaoir Loree MD; 02/05/2017 11:22 AM)  General Mental Status-Alert. General Appearance-Consistent with stated age. Hydration-Well hydrated. Voice-Normal.  Head and Neck Note: Mildly prominent right thyroid lobe. Previous scar on left neck from previous cervical fusion. No hoarseness. Trachea midline.  Chest and Lung Exam Chest and lung exam reveals -quiet, even and  easy respiratory effort with no use of accessory muscles and on  auscultation, normal breath sounds, no adventitious sounds and normal vocal resonance. Inspection Chest Wall - Normal. Back - normal.  Lymphatic Head & Neck  General Head & Neck Lymphatics: Bilateral - Description - Normal.    Assessment & Plan (Shylin Keizer A. Kersti Scavone MD; 02/05/2017 11:23 AM)  THYROID MASS (E07.9)  RIGHT THYROID NODULE (E04.1) Impression: Given the atypical findings on FNA, recommend right thyroid lobectomy. Given her previous left neck surgery, this would be the best approach for. Risks, benefits and other options discussed. The procedure was reviewed today. Patient understands and agrees to proceed.  Current Plans The anatomy and physiology of the thyroid gland and organs of the neck were discussed. Pathophysiology of thyroid problems were discussed. Options were discussed, and I made a recommendation to remove part (and possibly all) of the thyroid gland to treat the pathology. Risk of bleeding, infection, nerve injury TO VOICEBOX, AIRWAY ISSUES, hoarseness, low calcium, the further operations, consultations from anesthesia, complications Medical conditions, and nonoperative options discussed. Risks of bleeding, infection, injury to other organs including nerves, reoperation, death, and other risks were discussed. I noted a good likelihood this will help address the problem. While there are risks, I feel the risks of nonoperative management are greater; therefore, I feel surgery offers the best option. Educational material was given to help further explain the topics & concerns from our discussion. We will work to minimize complications.  You are being scheduled for surgery- Our schedulers will call you.  You should hear from our office's scheduling department within 5 working days about the location, date, and time of surgery. We try to make accommodations for patient's preferences in scheduling  surgery, but sometimes the OR schedule or the surgeon's schedule prevents [place] from making those accommodations.  If you have not heard from our office [phone]) in 5 working days, call the office and ask for your surgeon's nurse.  If you have other questions about your diagnosis, plan, or surgery, call the office and ask for your surgeon's nurse.  Pt Education - Pamphlet Given - The Thyroid Book: discussed with patient and provided information.

## 2017-02-05 NOTE — H&P (Signed)
[name] 02/05/2017 10:51 AM Location: [facility] Patient #: [id number] DOB: Aug 05, 1955 Divorced / Language: English / Race: Black or African American Female  History of Present Illness [name] Welcome MD; 02/05/2017 11:21 AM) Patient words: Patient returns for follow-up of her right thyroid mass. FNA was performed which showed a Bethesda grade 3 finding with atypical follicular cells.  Patient denies any new complaints.                            INDICATION: Indeterminate thyroid nodule of the right mid thyroid. Request is made for fine-needle aspiration of indeterminate thyroid nodule.  EXAM: ULTRASOUND GUIDED FINE NEEDLE ASPIRATION OF INDETERMINATE THYROID NODULE  COMPARISON: US SOFT TISSUE NECK 01/01/2017, from outside hospital  MEDICATIONS: 2 mL 1% lidocaine  COMPLICATIONS: None immediate.  TECHNIQUE: Informed written consent was obtained from the patient after a discussion of the risks, benefits and alternatives to treatment. Questions regarding the procedure were encouraged and answered. A timeout was performed prior to the initiation of the procedure.  Pre-procedural ultrasound scanning demonstrated right mid thyroid nodule unchanged in appearance.  The procedure was planned. The neck was prepped in the usual sterile fashion, and a sterile drape was applied covering the operative field. A timeout was performed prior to the initiation of the procedure. Local anesthesia was provided with 1% lidocaine.  Under direct ultrasound guidance, 4 FNA biopsies were performed of the right mid thyroid nodule with a 27 gauge needle. The samples were prepared and submitted to pathology.  Limited post procedural scanning was negative for hematoma or additional complication. Dressings were placed. The patient tolerated the above procedures procedure well without immediate postprocedural complication.  IMPRESSION: Technically  successful ultrasound guided fine needle aspiration of indeterminate right mid thyroid nodule.   Electronically Signed By: Jolaine Click M.D. On: 01/23/2017 16:47                    Diagnosis THYROID, FINE NEEDLE ASPIRATION RLP (SPECIMEN 1 OF 1, COLLECTED ON 01/23/2017): ATYPIA OF UNDETERMINED SIGNIFICANCE OR FOLLICULAR LESION OF UNDETERMINED SIGNIFICANCE (BETHESDA CATEGORY III). Valinda Hoar MD Pathologist, Electronic Signature.  The patient is a 61 year old female.   Allergies ([name], RMA; 02/05/2017 10:51 AM) No Known Drug Allergies 12/15/2016 Allergies Reconciled  Medication History ([name], RMA; 02/05/2017 10:51 AM) [name] XT (240MG  Capsule ER 24HR, Oral) Active. MetFORMIN HCl (500MG  Tablet, Oral) Active. Omeprazole (20MG  Capsule DR, Oral) Active. TiZANidine HCl (4MG  Tablet, Oral) Active. Ventolin HFA (108 (90 Base)MCG/ACT Aerosol Soln, Inhalation) Active. Warfarin Sodium (10MG  Tablet, Oral) Active. QUEtiapine Fumarate (50MG  Tablet, Oral) Active. Albuterol (90MCG/ACT Aerosol Soln, Inhalation) Active. AmLODIPine Besylate (5MG  Tablet, Oral) Active. CloNIDine HCl (0.1MG  Tablet, Oral) Active. Diclofenac Potassium (50MG  Tablet, Oral) Active. Medications Reconciled    Vitals (Colleen A. Brown RMA; 02/05/2017 10:51 AM) 02/05/2017 10:51 AM Weight: 270.6 lb Height: 67in Body Surface Area: 2.3 m Body Mass Index: 42.38 kg/m  Temp.: 97.52F  Pulse: 100 (Regular)  BP: 144/82 (Sitting, Left Arm, Standard)      Physical Exam (Colleen Massaro A. Kaoir Loree MD; 02/05/2017 11:22 AM)  General Mental Status-Alert. General Appearance-Consistent with stated age. Hydration-Well hydrated. Voice-Normal.  Head and Neck Note: Mildly prominent right thyroid lobe. Previous scar on left neck from previous cervical fusion. No hoarseness. Trachea midline.  Chest and Lung Exam Chest and lung exam reveals -quiet, even and  easy respiratory effort with no use of accessory muscles and on  auscultation, normal breath sounds, no adventitious sounds and normal vocal resonance. Inspection Chest Wall - Normal. Back - normal.  Lymphatic Head & Neck  General Head & Neck Lymphatics: Bilateral - Description - Normal.    Assessment & Plan (Colleen Keizer A. Kersti Scavone MD; 02/05/2017 11:23 AM)  THYROID MASS (E07.9)  RIGHT THYROID NODULE (E04.1) Impression: Given the atypical findings on FNA, recommend right thyroid lobectomy. Given her previous left neck surgery, this would be the best approach for. Risks, benefits and other options discussed. The procedure was reviewed today. Patient understands and agrees to proceed.  Current Plans The anatomy and physiology of the thyroid gland and organs of the neck were discussed. Pathophysiology of thyroid problems were discussed. Options were discussed, and I made a recommendation to remove part (and possibly all) of the thyroid gland to treat the pathology. Risk of bleeding, infection, nerve injury TO VOICEBOX, AIRWAY ISSUES, hoarseness, low calcium, the further operations, consultations from anesthesia, complications Medical conditions, and nonoperative options discussed. Risks of bleeding, infection, injury to other organs including nerves, reoperation, death, and other risks were discussed. I noted a good likelihood this will help address the problem. While there are risks, I feel the risks of nonoperative management are greater; therefore, I feel surgery offers the best option. Educational material was given to help further explain the topics & concerns from our discussion. We will work to minimize complications.  You are being scheduled for surgery- Our schedulers will call you.  You should hear from our office's scheduling department within 5 working days about the location, date, and time of surgery. We try to make accommodations for patient's preferences in scheduling  surgery, but sometimes the OR schedule or the surgeon's schedule prevents [place] from making those accommodations.  If you have not heard from our office [phone]) in 5 working days, call the office and ask for your surgeon's nurse.  If you have other questions about your diagnosis, plan, or surgery, call the office and ask for your surgeon's nurse.  Pt Education - Pamphlet Given - The Thyroid Book: discussed with patient and provided information.

## 2017-02-09 ENCOUNTER — Other Ambulatory Visit: Payer: Self-pay | Admitting: Oncology

## 2017-02-13 ENCOUNTER — Telehealth: Payer: Self-pay | Admitting: *Deleted

## 2017-02-13 NOTE — Telephone Encounter (Signed)
Call from [place] at [facility]: Pt is scheduled for thyroid surgery on 02/27/17. She is requesting clearance to HOLD Coumadin for 5 days prior to surgery. Message to MD for review.

## 2017-02-13 NOTE — Telephone Encounter (Signed)
See staff message reply

## 2017-02-15 NOTE — Telephone Encounter (Signed)
Message sent to scheduler for lab on 12/19 or 12/20. Lab appt for 12/13 will be canceled.

## 2017-02-16 ENCOUNTER — Emergency Department (HOSPITAL_COMMUNITY): Payer: Medicare Other

## 2017-02-16 ENCOUNTER — Encounter (HOSPITAL_COMMUNITY): Payer: Self-pay | Admitting: Emergency Medicine

## 2017-02-16 ENCOUNTER — Emergency Department (HOSPITAL_COMMUNITY)
Admission: EM | Admit: 2017-02-16 | Discharge: 2017-02-16 | Disposition: A | Discharge status: Home / Self Care | Payer: Medicare Other | Attending: Emergency Medicine | Admitting: Emergency Medicine

## 2017-02-16 DIAGNOSIS — I10 Essential (primary) hypertension: Secondary | ICD-10-CM | POA: Diagnosis not present

## 2017-02-16 DIAGNOSIS — Z86718 Personal history of other venous thrombosis and embolism: Secondary | ICD-10-CM | POA: Insufficient documentation

## 2017-02-16 DIAGNOSIS — J449 Chronic obstructive pulmonary disease, unspecified: Secondary | ICD-10-CM | POA: Insufficient documentation

## 2017-02-16 DIAGNOSIS — E119 Type 2 diabetes mellitus without complications: Secondary | ICD-10-CM | POA: Diagnosis not present

## 2017-02-16 DIAGNOSIS — Z79899 Other long term (current) drug therapy: Secondary | ICD-10-CM | POA: Insufficient documentation

## 2017-02-16 DIAGNOSIS — Z7901 Long term (current) use of anticoagulants: Secondary | ICD-10-CM | POA: Insufficient documentation

## 2017-02-16 DIAGNOSIS — Z87891 Personal history of nicotine dependence: Secondary | ICD-10-CM | POA: Insufficient documentation

## 2017-02-16 DIAGNOSIS — E876 Hypokalemia: Secondary | ICD-10-CM

## 2017-02-16 DIAGNOSIS — Z7984 Long term (current) use of oral hypoglycemic drugs: Secondary | ICD-10-CM | POA: Diagnosis not present

## 2017-02-16 DIAGNOSIS — R55 Syncope and collapse: Secondary | ICD-10-CM | POA: Insufficient documentation

## 2017-02-16 LAB — CBG MONITORING, ED: Glucose-Capillary: 163 mg/dL — ABNORMAL HIGH (ref 65–99)

## 2017-02-16 LAB — BASIC METABOLIC PANEL
Anion gap: 12 (ref 5–15)
BUN: 11 mg/dL (ref 6–20)
CO2: 26 mmol/L (ref 22–32)
Calcium: 8.2 mg/dL — ABNORMAL LOW (ref 8.9–10.3)
Chloride: 98 mmol/L — ABNORMAL LOW (ref 101–111)
Creatinine, Ser: 0.82 mg/dL (ref 0.44–1.00)
GFR calc Af Amer: >60 mL/min (ref >60)
GFR calc non Af Amer: >60 mL/min (ref >60)
Glucose, Bld: 150 mg/dL — ABNORMAL HIGH (ref 65–99)
Potassium: 2.5 mmol/L — CL (ref 3.5–5.1)
Sodium: 136 mmol/L (ref 135–145)

## 2017-02-16 LAB — PROTIME-INR
INR: 1.06
Prothrombin Time: 13.7 seconds (ref 11.4–15.2)

## 2017-02-16 LAB — URINALYSIS, ROUTINE W REFLEX MICROSCOPIC
Bilirubin Urine: NEGATIVE
Glucose, UA: NEGATIVE mg/dL
Hgb urine dipstick: NEGATIVE
Ketones, ur: NEGATIVE mg/dL
Leukocytes, UA: NEGATIVE
Nitrite: NEGATIVE
Protein, ur: NEGATIVE mg/dL
Specific Gravity, Urine: 1.024 (ref 1.005–1.030)
pH: 5 (ref 5.0–8.0)

## 2017-02-16 LAB — CBC
HCT: 33.8 % — ABNORMAL LOW (ref 36.0–46.0)
Hemoglobin: 10.9 g/dL — ABNORMAL LOW (ref 12.0–15.0)
MCH: 26 pg (ref 26.0–34.0)
MCHC: 32.2 g/dL (ref 30.0–36.0)
MCV: 80.5 fL (ref 78.0–100.0)
Platelets: 288 10*3/uL (ref 150–400)
RBC: 4.2 MIL/uL (ref 3.87–5.11)
RDW: 16.9 % — ABNORMAL HIGH (ref 11.5–15.5)
WBC: 6.3 10*3/uL (ref 4.0–10.5)

## 2017-02-16 LAB — TROPONIN I
Troponin I: <0.03 ng/mL (ref <0.03)
Troponin I: <0.03 ng/mL (ref <0.03)

## 2017-02-16 LAB — MAGNESIUM: Magnesium: 1.2 mg/dL — ABNORMAL LOW (ref 1.7–2.4)

## 2017-02-16 MED ORDER — POTASSIUM CHLORIDE 10 MEQ/100ML IV SOLN
10.0000 meq | INTRAVENOUS | Status: AC
  Administered 2017-02-16 (×3): 10 meq via INTRAVENOUS
  Filled 2017-02-16 (×3): qty 100

## 2017-02-16 MED ORDER — POTASSIUM CHLORIDE CRYS ER 20 MEQ PO TBCR: 20.0000 meq | EXTENDED_RELEASE_TABLET | Freq: Two times a day (BID) | ORAL | 0 refills | Status: DC

## 2017-02-16 MED ORDER — MAGNESIUM OXIDE 400 (241.3 MG) MG PO TABS: 400.0000 mg | ORAL_TABLET | Freq: Every day | ORAL | 0 refills | Status: DC

## 2017-02-16 MED ORDER — POTASSIUM CHLORIDE CRYS ER 20 MEQ PO TBCR
40.0000 meq | EXTENDED_RELEASE_TABLET | Freq: Once | ORAL | Status: AC
  Administered 2017-02-16: 40 meq via ORAL
  Filled 2017-02-16: qty 2

## 2017-02-16 MED ORDER — MAGNESIUM SULFATE 2 GM/50ML IV SOLN
2.0000 g | Freq: Once | INTRAVENOUS | Status: AC
  Administered 2017-02-16: 2 g via INTRAVENOUS
  Filled 2017-02-16: qty 50

## 2017-02-16 MED ORDER — SODIUM CHLORIDE 0.9 % IV BOLUS (SEPSIS)
1000.0000 mL | Freq: Once | INTRAVENOUS | Status: AC
  Administered 2017-02-16: 1000 mL via INTRAVENOUS

## 2017-02-16 NOTE — ED Notes (Signed)
Checked CBG 163, RN [employer] informed

## 2017-02-16 NOTE — ED Notes (Signed)
Patient denies pain and is resting comfortably.  

## 2017-02-16 NOTE — ED Notes (Signed)
Ambulated the pt to the bathroom and back to the room.  Pt tolerated well and required no assistance to stand or walk.

## 2017-02-16 NOTE — ED Notes (Signed)
Patient transported to CT 

## 2017-02-16 NOTE — ED Notes (Signed)
Patient didn't tolerate standing to complete orthostatic vital signs.

## 2017-02-16 NOTE — ED Triage Notes (Signed)
Pt. had a brief syncopal episode while visiting his son ( trauma A) at the ER this morning , pt. is somnolent/lethargic with diaphoresis at triage , respirations unlabored , denies pain , VSS /CBG= 163.

## 2017-02-16 NOTE — ED Provider Notes (Signed)
[facility] EMERGENCY DEPARTMENT Provider Note   CSN: [id number] Arrival date & time: 02/16/17  7829     History   Chief Complaint Chief Complaint  Patient presents with  . Syncope    HPI [name] is a 61 y.o. female.  Patient presents after near syncopal episode while visiting her family member who is a patient in the ED.  Daughter at bedside reports patient was doing well prior to visiting tonight.  She was walking in the hallway and began to complain of feeling dizzy.  Her daughter directed her back to the room where she lost consciousness in a chair and does not know what happened.  She was found to be diaphoretic with decreased responsiveness.  No incontinence.  No tongue biting.  She is a diabetic but blood sugar was 163.  Patient remains confused and seems uncomfortable but denies pain.  She complains of a "knot" in her chest and some difficulty swallowing.  EKG is nonischemic.  She denies any focal weakness, numbness or tingling.  She is on Coumadin for history of DVT.  States she ate and drank normally today.  No vomiting or diarrhea.  No fever or recent illnesses.   The history is provided by the patient and a relative. The history is limited by the condition of the patient.    Past Medical History:  Diagnosis Date  . Adrenal mass ([place]) 03/226   Benign  . Arthritis    knees/multiple orthopedic conditons; lower back  . Asthma    per pt  . 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 Depression   . Diabetes mellitus    120s usually fasting  . Dizziness, nonspecific   . DVT (deep venous thrombosis) (HCC)    Recurrent  . Fibromyalgia   . GERD (gastroesophageal reflux disease)   . Heart murmur   . History of blood transfusion   . History of cocaine abuse    Remote history   . Hypertension    takes meds daily  . Lupus Anticoagulant  Positive   . On home oxygen therapy    at night  . Shortness of breath    exertion or lying flat  . Sleep apnea    2l of oxygen at night    Patient Active Problem List   Diagnosis Date Noted  . Chronic pain of right knee 05/24/2016  . Effusion, right knee 04/28/2016  . Presence of right artificial knee joint 04/28/2016  . Abnormal LFTs   . Decreased sensation   . Paresthesia 06/12/2014  . Obstruction of kidney 04/24/2014  . Diabetes type 2, uncontrolled ([place]) 04/24/2014  . Chronic anticoagulation 04/24/2014  . Crack cocaine use 04/24/2014  . Depression 04/24/2014  . Fibromyalgia 04/24/2014  . COPD (chronic obstructive pulmonary disease) ([place]) 04/24/2014  . Obstructive sleep apnea on CPAP 04/24/2014  . Lupus anticoagulant positive 04/24/2014  . Adrenal mass, right ([place]) 04/24/2014  . Right kidney mass 04/24/2014  . Hydronephrosis of right kidney 04/24/2014  . Supratherapeutic INR 04/24/2014  . Renal mass, right 04/24/2014  . Diabetes type 2, controlled ([place])   . Right lower quadrant abdominal pain   . Morbid obesity ([place]) 08/21/2013  . Unspecified constipation 08/19/2013  . COPD exacerbation ([place]) 08/17/2013  . Morbid obesity with BMI of 45.0-49.9, adult ([place]) 01/05/2012  . Bilateral deep vein thromboses ([place]) 12/13/2011  . Abdominal  pain 11/05/2011  . Hypokalemia 11/05/2011  . Sciatica 11/05/2011  . Type II or unspecified type diabetes mellitus without mention of complication, not stated as uncontrolled 11/05/2011  . HTN (hypertension) 11/05/2011    Past Surgical History:  Procedure Laterality Date  . ABDOMINAL HYSTERECTOMY  1989   Fibroids  . ANTERIOR LUMBAR FUSION  01/03/2012   Procedure: ANTERIOR LUMBAR FUSION 1 LEVEL;  Surgeon: Winfield Cunas, MD;  Location: [facility];  Service: Neurosurgery;  Laterality: N/A;  Lumbar Four-Five Anterior Lumbar Interbody Fusion with Instrumentation  . BACK SURGERY     cervical spine---disk disease  . CARPAL TUNNEL RELEASE       Bilateral  . CESAREAN SECTION    . CYSTOSCOPY/RETROGRADE/URETEROSCOPY/STONE EXTRACTION WITH BASKET Right 04/26/2014   Procedure: CYSTOSCOPY/ RIGHT RETROGRADE/ RIGHT URETEROSCOPY/URETERAL AND RENAL PELVIS BIOPSY;  Surgeon: Alexis Frock, MD;  Location: [facility];  Service: Urology;  Laterality: Right;  . gallstones removed    . 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   bilateral  . right elbow surgery    . TUBAL LIGATION      OB History    No data available       Home Medications    Prior to Admission medications   Medication Sig Start Date End Date Taking? Authorizing Provider  albuterol (PROVENTIL HFA;VENTOLIN HFA) 108 (90 BASE) MCG/ACT inhaler Inhale 2 puffs into the lungs every 4 (four) hours as needed for shortness of breath.   Yes [provider]  albuterol (PROVENTIL) (2.5 MG/3ML) 0.083% nebulizer solution Take 2.5 mg by nebulization every 6 (six) hours as needed for wheezing or shortness of breath.   Yes [provider]  allopurinol (ZYLOPRIM) 100 MG tablet Take 100mg  by mouth daily 07/03/16  Yes [provider]  amLODipine (NORVASC) 5 MG tablet Take 5 mg by mouth daily.   Yes [provider]  canagliflozin (INVOKANA) 100 MG TABS tablet Take 100 mg by mouth daily.   Yes [provider]  CARTIA XT 240 MG 24 hr capsule Take 240 mg by mouth daily. 06/17/14  Yes [provider]  cloNIDine (CATAPRES) 0.1 MG tablet Take 0.1 mg by mouth 2 (two) times daily.   Yes [provider]  cyclobenzaprine (FLEXERIL) 10 MG tablet Take 10 mg by mouth 2 (two) times daily as needed for muscle spasms.  06/05/14  Yes [provider]  diclofenac sodium (VOLTAREN) 1 % GEL Apply 2 g topically daily as needed (fibromyalgia pain).   Yes [provider]  DULoxetine (CYMBALTA) 60 MG capsule Take 60 mg by mouth every morning.    Yes [provider]  gabapentin (NEURONTIN) 300 MG capsule Take 300 mg by mouth at bedtime.    Yes  [provider]  loratadine (CLARITIN) 10 MG tablet Take 10 mg by mouth daily as needed for allergies.   Yes [provider]  metFORMIN (GLUCOPHAGE) 500 MG tablet Take 500 mg by mouth at bedtime.   Yes [provider]  omeprazole (PRILOSEC) 20 MG capsule Take 20 mg by mouth 2 (two) times daily.   Yes [provider]  polyethylene glycol (MIRALAX / GLYCOLAX) packet Take 17 g by mouth daily.   Yes [provider]  pravastatin (PRAVACHOL) 40 MG tablet Take 40 mg by mouth every evening.  06/15/15  Yes [provider]  QUEtiapine (SEROQUEL) 50 MG tablet Take 50 mg by mouth at bedtime.    Yes [provider]  traMADol (ULTRAM) 50 MG tablet TAKE 1-2  TABLETS BY MOUTH EVERY DAY AS NEEDED FOR PAIN 09/04/16  Yes Meredith Pel, MD  warfarin (COUMADIN) 5 MG tablet Take 1 tablet (5 mg total) by mouth daily. 07/10/16  Yes Owens Shark, NP  HYDROcodone-acetaminophen (NORCO) 5-325 MG tablet Take 1-2 tablets by mouth every 6 (six) hours as needed for severe pain. Patient not taking: Reported on 02/16/2017 09/01/16   Orlie Dakin, MD  meclizine (ANTIVERT) 25 MG tablet Take 1 tablet (25 mg total) by mouth 3 (three) times daily as needed for dizziness. Patient not taking: Reported on 02/16/2017 09/29/16   Charlesetta Shanks, MD  metoCLOPramide (REGLAN) 10 MG tablet Take 1 tablet (10 mg total) by mouth every 6 (six) hours as needed for nausea (or headache). Patient not taking: Reported on 25/95/6387 5/64/33   Delora Fuel, MD  oxyCODONE-acetaminophen (PERCOCET) 5-325 MG tablet Take 1 tablet by mouth every 12 (twelve) hours as needed for moderate pain. Patient not taking: Reported on 02/16/2017 05/24/16   Meredith Pel, MD    Family History Family History  Problem Relation Age of Onset  . Hypertension Mother   . Diabetes Mother   . Cancer Mother        Pancreatic  . Hypertension Father   . Heart failure Father     Social History Social  History   Tobacco Use  . Smoking status: Former Smoker    Start date: 03/20/2001    Last attempt to quit: 03/21/2003    Years since quitting: 13.9  . Smokeless tobacco: Never Used  . Tobacco comment: smoked for 2 years  Substance Use Topics  . Alcohol use: No    Alcohol/week: 0.0 oz    Comment: beer in past   . Drug use: Yes    Types: Cocaine    Comment: quit 2003-rehab progam in 2005     Allergies   Lisinopril and Corticosteroids   Review of Systems Review of Systems  Unable to perform ROS: Mental status change     Physical Exam Updated Vital Signs BP 133/82   Pulse 71   Temp 98.7 F (37.1 C) (Oral)   Resp 17   SpO2 97%   Physical Exam  Constitutional: She is oriented to person, place, and time. She appears well-developed and well-nourished. No distress.  Appears uncomfortable  HENT:  Head: Normocephalic and atraumatic.  Mouth/Throat: Oropharynx is clear and moist. No oropharyngeal exudate.  Eyes: Conjunctivae and EOM are normal. Pupils are equal, round, and reactive to light.  Neck: Normal range of motion. Neck supple.  No meningismus.  Cardiovascular: Normal rate, regular rhythm, normal heart sounds and intact distal pulses. Exam reveals no gallop.  No murmur heard. Pulmonary/Chest: Effort normal and breath sounds normal. No respiratory distress. She exhibits no tenderness.  Abdominal: Soft. There is no tenderness. There is no rebound and no guarding.  Musculoskeletal: Normal range of motion. She exhibits no edema or tenderness.  Neurological: She is alert and oriented to person, place, and time. No cranial nerve deficit. She exhibits normal muscle tone. Coordination normal.  No ataxia on finger to nose bilaterally. No pronator drift. 5/5 strength throughout. CN 2-12 intact.Equal grip strength. Sensation intact.   Skin: Skin is warm.  Psychiatric: She has a normal mood and affect. Her behavior is normal.  Nursing note and vitals reviewed.    ED Treatments /  Results  Labs (all labs ordered are listed, but only abnormal results are displayed) Labs Reviewed  BASIC METABOLIC PANEL - Abnormal; Notable for the  following components:      Result Value   Potassium 2.5 (*)    Chloride 98 (*)    Glucose, Bld 150 (*)    Calcium 8.2 (*)    All other components within normal limits  CBC - Abnormal; Notable for the following components:   Hemoglobin 10.9 (*)    HCT 33.8 (*)    RDW 16.9 (*)    All other components within normal limits  CBG MONITORING, ED - Abnormal; Notable for the following components:   Glucose-Capillary 163 (*)    All other components within normal limits  PROTIME-INR  URINALYSIS, ROUTINE W REFLEX MICROSCOPIC  MAGNESIUM  TROPONIN I  CBG MONITORING, ED    EKG  EKG Interpretation  Date/Time:  Friday February 16 2017 03:50:50 EST Ventricular Rate:  68 PR Interval:  150 QRS Duration: 92 QT Interval:  486 QTC Calculation: 517 R Axis:   35 Text Interpretation:  Sinus rhythm Probable LVH with secondary repol abnrm Prolonged QT interval No significant change was found Confirmed by Ezequiel Essex [phone]) on 02/16/2017 4:02:53 AM       Radiology Ct Head Wo Contrast  Result Date: 02/16/2017 CLINICAL DATA:  Altered level of consciousness. EXAM: CT HEAD WITHOUT CONTRAST TECHNIQUE: Contiguous axial images were obtained from the base of the skull through the vertex without intravenous contrast. COMPARISON:  Head CT 09/01/2016 FINDINGS: Brain: Mild atrophy and chronic small vessel ischemia, similar to prior. No intracranial hemorrhage, mass effect, or midline shift. No hydrocephalus. The basilar cisterns are patent. No evidence of territorial infarct or acute ischemia. No extra-axial or intracranial fluid collection. Vascular: No hyperdense vessel or unexpected calcification. Skull: No fracture or focal lesion. Sinuses/Orbits: Paranasal sinuses and mastoid air cells are clear. The visualized orbits are unremarkable. Other: None.  IMPRESSION: No acute intracranial abnormality. Electronically Signed   By: Jeb Levering M.D.   On: 02/16/2017 04:53    Procedures Procedures (including critical care time)  Medications Ordered in ED Medications  potassium chloride 10 mEq in 100 mL IVPB (10 mEq Intravenous New Bag/Given 02/16/17 0559)  potassium chloride SA (K-DUR,KLOR-CON) CR tablet 40 mEq (not administered)     Initial Impression / Assessment and Plan / ED Course  I have reviewed the triage vital signs and the nursing notes.  Pertinent labs & imaging results that were available during my care of the patient were reviewed by me and considered in my medical decision making (see chart for details).    Patient had syncopal episode while visiting her son in the ED.  She was found to be confused, diaphoretic and slow to respond.  CBG 163.  CT head obtained given patient's history of Coumadin use and this was negative.  EKG is sinus rhythm.  No Brugada or prolonged QT.  Labs obtained and are remarkable for hypokalemia of 2.5  Patient's orthostatics are positive.  Her blood pressure did drop with sitting at the side of the bed.  IV fluids givenand  Labs remarkable for hypokalemia and hypomagnesemia.  These are repleted. Troponin negative.  Patient is adamant that she does not want to be admitted to the hospital.  She denies any chest pain or shortness of breath.  She denies any dizziness or lightheadedness.  She is tolerating p.o.  Orthostatics have improved and she is ambulatory and tolerating PO.  Suspect vasovagal syncope and possible orthostasis.  Advised observation for possibly arrhythmia but patient is adamant that she go home. Doubt ACS. Doubt PE.  Will replete potassium  and mag. Check second troponin.  Dr. Eulis Foster to disposition when meds complete.       Final Clinical Impressions(s) / ED Diagnoses   Final diagnoses:  None    ED Discharge Orders    None       Thaddus Mcdowell, Annie Main, MD 02/16/17  [phone]

## 2017-02-16 NOTE — ED Notes (Signed)
Patient returned from CT

## 2017-02-16 NOTE — Discharge Instructions (Addendum)
Keep yourself hydrated. You declined observation in the hospital today. Take the potassium supplements as prescribed.  Follow-up with your doctor, for repeat blood testing in 1-2 weeks.  Return to the ED if develop chest pain, shortness of breath or any other concerns.

## 2017-02-19 ENCOUNTER — Telehealth: Payer: Self-pay | Admitting: Oncology

## 2017-02-19 NOTE — Telephone Encounter (Signed)
R/s lab apt from 12/13 to 12/20 per sch message 11/29 - left message for patient and sent reminder letter in the mail with appt date and time.

## 2017-02-21 NOTE — Pre-Procedure Instructions (Signed)
[name]  02/21/2017      [place], [place] [address] Phone: [phone] Fax: [phone]  [facility] [place], [place] [address] Phone: [phone] Fax: [phone]    Your procedure is scheduled on Tuesday, December, 11, 2018  Report to [facility] Admitting Entrance "A" at 7:00AM   Call this number if you have problems the morning of surgery:  [phone]   Remember:  Do not eat food or drink liquids after midnight.  Take these medicines the morning of surgery with A SIP OF WATER: Allopurinol (ZYLOPRIM), AmLODipine (NORVASC), CARTIA XT, CloNIDine (CATAPRES),  DULoxetine (CYMBALTA), Omeprazole (PRILOSEC). If needed TraMADol Veatrice Bourbon) for pain, Loratadine (CLARITIN) for allergies, Cyclobenzaprine (FLEXERIL) for spasms, and Albuterol Nebulizer/ Inhaler for cough or wheezing (Bring inhaler with you the day of surgery).  Follow your doctor's instruction regarding Coumadin.  As of today, stop taking all Aspirins, Vitamins, Fish oils, and Herbal medications. Also stop all NSAIDS i.e. Advil, Ibuprofen, Motrin, Aleve, Anaprox, Naproxen, BC and Goody Powders.  How to Manage Your Diabetes Before and After Surgery  Why is it important to control my blood sugar before and after surgery? . Improving blood sugar levels before and after surgery helps healing and can limit problems. . A way of improving blood sugar control is eating a healthy diet by: o  Eating less sugar and carbohydrates o  Increasing activity/exercise o  Talking with your doctor about reaching your blood sugar goals . High blood sugars (greater than 180 mg/dL) can raise your risk of infections and slow your recovery, so you will need to focus on controlling your diabetes during the weeks before surgery. . Make sure that the doctor  who takes care of your diabetes knows about your planned surgery including the date and location.  How do I manage my blood sugar before surgery? . Check your blood sugar at least 4 times a day, starting 2 days before surgery, to make sure that the level is not too high or low. o Check your blood sugar the morning of your surgery when you wake up and every 2 hours until you get to the Short Stay unit. . If your blood sugar is less than 70 mg/dL, you will need to treat for low blood sugar: o Do not take insulin. o Treat a low blood sugar (less than 70 mg/dL) with  cup of clear juice (cranberry or apple), 4 glucose tablets, OR glucose gel. Recheck blood sugar in 15 minutes after treatment (to make sure it is greater than 70 mg/dL). If your blood sugar is not greater than 70 mg/dL on recheck, call [phone] o  for further instructions. . Report your blood sugar to the short stay nurse when you get to Short Stay.  . If you are admitted to the hospital after surgery: o Your blood sugar will be checked by the staff and you will probably be given insulin after surgery (instead of oral diabetes medicines) to make sure you have good blood sugar levels. o The [place] for blood sugar control after surgery is 80-180 mg/dL.  WHAT DO I DO ABOUT MY DIABETES MEDICATION?  Marland Kitchen Do not take Canagliflozin [facility]) the morning of surgery.  . If your CBG is greater than 220 mg/dL, call us at [phone]  Please complete  your PRE-SURGERY ENSURE that was given to before you leave your house the morning of surgery.  Please, if able, drink it in one setting. DO NOT SIP.   Do not wear jewelry, make-up or nail polish.  Do not wear lotions, powders, perfumes, or deodorant.  Do not shave 48 hours prior to surgery.    Do not bring valuables to the hospital.  [facility] is not responsible for any belongings or valuables.  Contacts, dentures or bridgework may not be worn into surgery.  Leave your suitcase in the car.   After surgery it may be brought to your room.  For patients admitted to the hospital, discharge time will be determined by your treatment team.  Patients discharged the day of surgery will not be allowed to drive home.   Special instructions:  [facility]- Preparing For Surgery  Before surgery, you can play an important role. Because skin is not sterile, your skin needs to be as free of germs as possible. You can reduce the number of germs on your skin by washing with CHG (chlorahexidine gluconate) Soap before surgery.  CHG is an antiseptic cleaner which kills germs and bonds with the skin to continue killing germs even after washing.  Please do not use if you have an allergy to CHG or antibacterial soaps. If your skin becomes reddened/irritated stop using the CHG.  Do not shave (including legs and underarms) for at least 48 hours prior to first CHG shower. It is OK to shave your face.  Please follow these instructions carefully.   1. Shower the NIGHT BEFORE SURGERY and the MORNING OF SURGERY with CHG.   2. If you chose to wash your hair, wash your hair first as usual with your normal shampoo.  3. After you shampoo, rinse your hair and body thoroughly to remove the shampoo.  4. Use CHG as you would any other liquid soap. You can apply CHG directly to the skin and wash gently with a scrungie or a clean washcloth.   5. Apply the CHG Soap to your body ONLY FROM THE NECK DOWN.  Do not use on open wounds or open sores. Avoid contact with your eyes, ears, mouth and genitals (private parts). Wash Face and genitals (private parts)  with your normal soap.  6. Wash thoroughly, paying special attention to the area where your surgery will be performed.  7. Thoroughly rinse your body with warm water from the neck down.  8. DO NOT shower/wash with your normal soap after using and rinsing off the CHG Soap.  9. Pat yourself dry with a CLEAN TOWEL.  10. Wear CLEAN PAJAMAS to bed the night before  surgery, wear comfortable clothes the morning of surgery  11. Place CLEAN SHEETS on your bed the night of your first shower and DO NOT SLEEP WITH PETS.  Day of Surgery: Do not apply any deodorants/lotions. Please wear clean clothes to the hospital/surgery center.    Please read over the following fact sheets that you were given. Pain Booklet, Coughing and Deep Breathing and Surgical Site Infection Prevention

## 2017-02-21 NOTE — Pre-Procedure Instructions (Signed)
[name]  02/21/2017      [address], [place] [address] Phone: [phone] Fax: [phone]  [facility] [place], [place] [address] Phone: [phone] Fax: [phone]    Your procedure is scheduled on December 11th, Tuesday.   Report to [facility] Admitting at 7:00 AM   Call this number if you have problems the [facility] of surgery:  [phone].   Remember:                            4-5 days prior to surgery, STOP TAKING any Vitamins, herbal supplements, anti-inflammatories.   Do not eat food or drink liquids after midnight Monday.   Take these medicines the morning of surgery with A SIP OF WATER : Norvasc, Clonidine, Cymbalta, Flexeril, Omeprazole.  Please use your inhalers that morning.              Please stop your Coumadin on ___________________________   Do not wear jewelry, make-up or nail polish.  Do not wear lotions, powders, or perfumes, or deoderant.  Do not shave 48 hours prior to surgery.  Men may shave face and neck.  Do not bring valuables to the hospital.  [facility] is not responsible for any belongings or valuables.  Contacts, dentures or bridgework may not be worn into surgery.  Leave your suitcase in the car.  After surgery it may be brought to your room.  For patients admitted to the hospital, discharge time will be determined by your treatment team.  Please read over the following fact sheets that you were given. Pain Booklet, MRSA Information and Surgical Site Infection Prevention     [facility]- Preparing For Surgery  Before surgery, you can play an important role. Because skin is not sterile, your skin needs to be as free of germs as possible. You can reduce the number of germs on your skin by washing with CHG (chlorahexidine gluconate) Soap before  surgery.  CHG is an antiseptic cleaner which kills germs and bonds with the skin to continue killing germs even after washing.  Please do not use if you have an allergy to CHG or antibacterial soaps. If your skin becomes reddened/irritated stop using the CHG.  Do not shave (including legs and underarms) for at least 48 hours prior to first CHG shower. It is OK to shave your face.  Please follow these instructions carefully.   1. Shower the NIGHT BEFORE SURGERY and the MORNING OF SURGERY with CHG.   2. If you chose to wash your hair, wash your hair first as usual with your normal shampoo.  3. After you shampoo, rinse your hair and body thoroughly to remove the shampoo.  4. Use CHG as you would any other liquid soap. You can apply CHG directly to the skin and wash gently with a scrungie or a clean washcloth.   5. Apply the CHG Soap to your body ONLY FROM THE NECK DOWN.  Do not use on open wounds or open sores. Avoid contact with your eyes, ears, mouth and genitals (private parts). Wash Face and genitals (private parts)  with your normal soap.  6. Wash thoroughly, paying special attention to the area where your surgery will be performed.  7. Thoroughly rinse  your body with warm water from the neck down.  8. DO NOT shower/wash with your normal soap after using and rinsing off the CHG Soap.  9. Pat yourself dry with a CLEAN TOWEL.  10. Wear CLEAN PAJAMAS to bed the night before surgery, wear comfortable clothes the morning of surgery  11. Place CLEAN SHEETS on your bed the night of your first shower and DO NOT SLEEP WITH PETS.  Day of Surgery: Do not apply any deodorants/lotions. Please wear clean clothes to the hospital/surgery center.        How to Manage Your Diabetes Before and After Surgery  Why is it important to control my blood sugar before and after surgery? . Improving blood sugar levels before and after surgery helps healing and can limit problems. . A way of improving  blood sugar control is eating a healthy diet by: o  Eating less sugar and carbohydrates o  Increasing activity/exercise o  Talking with your doctor about reaching your blood sugar goals . High blood sugars (greater than 180 mg/dL) can raise your risk of infections and slow your recovery, so you will need to focus on controlling your diabetes during the weeks before surgery. . Make sure that the doctor who takes care of your diabetes knows about your planned surgery including the date and location.  How do I manage my blood sugar before surgery? . Check your blood sugar at least 4 times a day, starting 2 days before surgery, to make sure that the level is not too high or low.  o Check your blood sugar the morning of your surgery when you wake up and every 2 hours until you get to the Short Stay unit. . If your blood sugar is less than 70 mg/dL, you will need to treat for low blood sugar: o Do not take insulin. o Treat a low blood sugar (less than 70 mg/dL) with  cup of clear juice (cranberry or apple), 4 glucose tablets, OR glucose gel.              ( DO NOT DRINK ORANGE JUICE )               Recheck blood sugar in 15 minutes after treatment (to make sure it is greater than 70 mg/dL). If your blood sugar is not greater than 70 mg/dL on recheck, call [phone] o  for further instructions. . Report your blood sugar to the short stay nurse when you get to Short Stay.  . If you are admitted to the hospital after surgery: o Your blood sugar will be checked by the staff and you will probably be given insulin after surgery (instead of oral diabetes medicines) to make sure you have good blood sugar levels. o The Heather for blood sugar control after surgery is 80-180 mg/dL.  WHAT DO I DO ABOUT MY DIABETES MEDICATION?   Marland Kitchen Do not take oral diabetes medicines (pills) the morning of surgery.  . The day of surgery, do not take other diabetes injectables, including Byetta (exenatide), Bydureon  (exenatide ER), Victoza (liraglutide), or Trulicity (dulaglutide).  . If your CBG is greater than 220 mg/dL, you may take  of your sliding scale (correction) dose of insulin.  Other Instructions:          Patient Signature:  Date:   Nurse Signature:  Date:   Reviewed and Endorsed by [facility] Patient Education Committee, August 2015

## 2017-02-22 ENCOUNTER — Other Ambulatory Visit: Payer: Self-pay

## 2017-02-22 ENCOUNTER — Encounter (HOSPITAL_COMMUNITY)
Admission: RE | Admit: 2017-02-22 | Discharge: 2017-02-22 | Disposition: A | Discharge status: Home / Self Care | Payer: Medicare Other | Source: Ambulatory Visit | Attending: Surgery | Admitting: Surgery

## 2017-02-22 ENCOUNTER — Encounter (HOSPITAL_COMMUNITY): Payer: Self-pay

## 2017-02-22 DIAGNOSIS — Z87891 Personal history of nicotine dependence: Secondary | ICD-10-CM | POA: Insufficient documentation

## 2017-02-22 DIAGNOSIS — K219 Gastro-esophageal reflux disease without esophagitis: Secondary | ICD-10-CM | POA: Diagnosis not present

## 2017-02-22 DIAGNOSIS — I509 Heart failure, unspecified: Secondary | ICD-10-CM | POA: Insufficient documentation

## 2017-02-22 DIAGNOSIS — I739 Peripheral vascular disease, unspecified: Secondary | ICD-10-CM | POA: Insufficient documentation

## 2017-02-22 DIAGNOSIS — Z79899 Other long term (current) drug therapy: Secondary | ICD-10-CM | POA: Diagnosis not present

## 2017-02-22 DIAGNOSIS — I110 Hypertensive heart disease with heart failure: Secondary | ICD-10-CM | POA: Diagnosis not present

## 2017-02-22 DIAGNOSIS — Z7984 Long term (current) use of oral hypoglycemic drugs: Secondary | ICD-10-CM | POA: Diagnosis not present

## 2017-02-22 DIAGNOSIS — E1165 Type 2 diabetes mellitus with hyperglycemia: Secondary | ICD-10-CM | POA: Diagnosis not present

## 2017-02-22 DIAGNOSIS — J449 Chronic obstructive pulmonary disease, unspecified: Secondary | ICD-10-CM | POA: Diagnosis not present

## 2017-02-22 DIAGNOSIS — Z01812 Encounter for preprocedural laboratory examination: Secondary | ICD-10-CM | POA: Diagnosis present

## 2017-02-22 HISTORY — DX: Hypothyroidism, unspecified: E03.9

## 2017-02-22 LAB — CBC WITH DIFFERENTIAL/PLATELET
Basophils Absolute: 0 10*3/uL (ref 0.0–0.1)
Basophils Relative: 0 %
Eosinophils Absolute: 0.1 10*3/uL (ref 0.0–0.7)
Eosinophils Relative: 2 %
HCT: 36.4 % (ref 36.0–46.0)
Hemoglobin: 11.8 g/dL — ABNORMAL LOW (ref 12.0–15.0)
Lymphocytes Relative: 43 %
Lymphs Abs: 2.3 10*3/uL (ref 0.7–4.0)
MCH: 26.6 pg (ref 26.0–34.0)
MCHC: 32.4 g/dL (ref 30.0–36.0)
MCV: 82 fL (ref 78.0–100.0)
Monocytes Absolute: 0.4 10*3/uL (ref 0.1–1.0)
Monocytes Relative: 7 %
Neutro Abs: 2.6 10*3/uL (ref 1.7–7.7)
Neutrophils Relative %: 48 %
Platelets: 306 10*3/uL (ref 150–400)
RBC: 4.44 MIL/uL (ref 3.87–5.11)
RDW: 17 % — ABNORMAL HIGH (ref 11.5–15.5)
WBC: 5.3 10*3/uL (ref 4.0–10.5)

## 2017-02-22 LAB — COMPREHENSIVE METABOLIC PANEL
ALT: 49 U/L (ref 14–54)
AST: 32 U/L (ref 15–41)
Albumin: 3.9 g/dL (ref 3.5–5.0)
Alkaline Phosphatase: 96 U/L (ref 38–126)
Anion gap: 15 (ref 5–15)
BUN: 9 mg/dL (ref 6–20)
CO2: 24 mmol/L (ref 22–32)
Calcium: 8.5 mg/dL — ABNORMAL LOW (ref 8.9–10.3)
Chloride: 99 mmol/L — ABNORMAL LOW (ref 101–111)
Creatinine, Ser: 0.75 mg/dL (ref 0.44–1.00)
GFR calc Af Amer: >60 mL/min (ref >60)
GFR calc non Af Amer: >60 mL/min (ref >60)
Glucose, Bld: 142 mg/dL — ABNORMAL HIGH (ref 65–99)
Potassium: 2.7 mmol/L — CL (ref 3.5–5.1)
Sodium: 138 mmol/L (ref 135–145)
Total Bilirubin: 0.5 mg/dL (ref 0.3–1.2)
Total Protein: 7.7 g/dL (ref 6.5–8.1)

## 2017-02-22 LAB — GLUCOSE, CAPILLARY: Glucose-Capillary: 128 mg/dL — ABNORMAL HIGH (ref 65–99)

## 2017-02-22 LAB — HEMOGLOBIN A1C
Hgb A1c MFr Bld: 6.8 % — ABNORMAL HIGH (ref 4.8–5.6)
Mean Plasma Glucose: 148.46 mg/dL

## 2017-02-22 NOTE — Progress Notes (Addendum)
PCP is Dr. Luanna Salk 12/2016  [facility]  [phone] Only checks sugar once a week.  Runs around 2 - [phone] Oncologist  Dr. Benay Spice has prescribed the coumadin, which her last dose was 02/21/2017 She was just in ER for syncope - her K level was 2.5 then.  She has been prescribed K pills and will be starting them today  02/22/2017, but has been eating bananas. Currently denies any cardiac issues.  Gets winded with exertion.  No longer wears a mask for OSA  - "I don't have it anymore" Was contacted by our lab, that patients' K was 2.7  I have called [facility] and spoke with Claiborne Billings and relayed "story" and she will forward it on to Dr. Brantley Stage.

## 2017-02-23 NOTE — Progress Notes (Signed)
Anesthesia Chart Review:  Pt is a 61 year old female scheduled for R thyroid lobectomy on 02/27/2017 with Erroll Luna, MD  - PCP is [name], MD - Hematologist is [name], MD, who gave ok to hold coumadin for 5 days.   PMH includes:  HTN, DM, COPD, asthma, OSA, uses home oxygen at night, hypothyroidism, recurrent DVT, clotting disorder, GERD. Former smoker. Remote cocaine use. BMI 42.5. S/p anterior lumbar fusion 01/03/12.   Medications include: Albuterol, amlodipine, canagliflozin, cardia XT, clonidine, metformin, Prilosec, potassium, pravastatin, Seroquel, coumadin. Last dose coumadin 02/21/17  BP (!) 168/93   Pulse 88   Temp 36.4 C   Resp 20   Ht 5\' 6"  (1.676 m)   Wt 264 lb (119.7 kg)   SpO2 95%   BMI 42.61 kg/m   Preoperative labs reviewed.   - HbA1c 6.8, glucose 142 - K is 2.7.  Claiborne Billings in Dr. Josetta Huddle office notified by pre-admission testing RN.  - Will recheck K day of surgery.   1 view CXR 09/01/16:  - Borderline to mild cardiomegaly.  No edema or infiltrate.  EKG 02/16/17: Sinus rhythm. Probable LVH with secondary repol abnrm. Prolonged QT interval  Echo 06/14/14:  - Left ventricle: The cavity size was normal. There was mildconcentric hypertrophy. Systolic function was normal. The estimated ejection fraction was in the range of 60% to 65%. Wallmotion was normal; there were no regional wall motion abnormalities. Features are consistent with a pseudonormal leftventricular filling pattern, with concomitant abnormal relaxationand increased filling pressure (grade 2 diastolic dysfunction).  If labs acceptable day of surgery, I anticipate pt can proceed as scheduled.   Willeen Cass, FNP-BC [facility] Short Stay Surgical Center/Anesthesiology Phone: [phone] 02/23/2017 12:37 PM

## 2017-02-26 MED ORDER — DEXTROSE 5 % IV SOLN
3.0000 g | INTRAVENOUS | Status: AC
  Administered 2017-02-27: 3 g via INTRAVENOUS
  Filled 2017-02-26: qty 3

## 2017-02-26 MED ORDER — GABAPENTIN 300 MG PO CAPS
300.0000 mg | ORAL_CAPSULE | ORAL | Status: AC
  Administered 2017-02-27: 300 mg via ORAL
  Filled 2017-02-26: qty 1

## 2017-02-26 MED ORDER — ACETAMINOPHEN 500 MG PO TABS
1000.0000 mg | ORAL_TABLET | ORAL | Status: AC
  Administered 2017-02-27: 1000 mg via ORAL
  Filled 2017-02-26: qty 2

## 2017-02-27 ENCOUNTER — Other Ambulatory Visit: Payer: Self-pay

## 2017-02-27 ENCOUNTER — Encounter (HOSPITAL_COMMUNITY): Payer: Self-pay | Admitting: Urology

## 2017-02-27 ENCOUNTER — Observation Stay (HOSPITAL_COMMUNITY)
Admission: RE | Admit: 2017-02-27 | Discharge: 2017-02-28 | Disposition: A | Discharge status: Home / Self Care | Payer: Medicare Other | Source: Ambulatory Visit | Attending: Surgery | Admitting: Surgery

## 2017-02-27 ENCOUNTER — Encounter (HOSPITAL_COMMUNITY)
Admission: RE | Disposition: A | Discharge status: Home / Self Care | Payer: Self-pay | Source: Ambulatory Visit | Attending: Surgery

## 2017-02-27 ENCOUNTER — Ambulatory Visit (HOSPITAL_COMMUNITY): Payer: Medicare Other | Admitting: Emergency Medicine

## 2017-02-27 DIAGNOSIS — E079 Disorder of thyroid, unspecified: Principal | ICD-10-CM | POA: Insufficient documentation

## 2017-02-27 DIAGNOSIS — D649 Anemia, unspecified: Secondary | ICD-10-CM | POA: Insufficient documentation

## 2017-02-27 DIAGNOSIS — Z6841 Body Mass Index (BMI) 40.0 and over, adult: Secondary | ICD-10-CM | POA: Insufficient documentation

## 2017-02-27 DIAGNOSIS — I509 Heart failure, unspecified: Secondary | ICD-10-CM | POA: Diagnosis not present

## 2017-02-27 DIAGNOSIS — Z87891 Personal history of nicotine dependence: Secondary | ICD-10-CM | POA: Insufficient documentation

## 2017-02-27 DIAGNOSIS — Z7984 Long term (current) use of oral hypoglycemic drugs: Secondary | ICD-10-CM | POA: Diagnosis not present

## 2017-02-27 DIAGNOSIS — K219 Gastro-esophageal reflux disease without esophagitis: Secondary | ICD-10-CM | POA: Diagnosis not present

## 2017-02-27 DIAGNOSIS — I739 Peripheral vascular disease, unspecified: Secondary | ICD-10-CM | POA: Diagnosis not present

## 2017-02-27 DIAGNOSIS — M797 Fibromyalgia: Secondary | ICD-10-CM | POA: Diagnosis not present

## 2017-02-27 DIAGNOSIS — Z79899 Other long term (current) drug therapy: Secondary | ICD-10-CM | POA: Insufficient documentation

## 2017-02-27 DIAGNOSIS — M1990 Unspecified osteoarthritis, unspecified site: Secondary | ICD-10-CM | POA: Insufficient documentation

## 2017-02-27 DIAGNOSIS — E119 Type 2 diabetes mellitus without complications: Secondary | ICD-10-CM | POA: Diagnosis not present

## 2017-02-27 DIAGNOSIS — I110 Hypertensive heart disease with heart failure: Secondary | ICD-10-CM | POA: Insufficient documentation

## 2017-02-27 DIAGNOSIS — E785 Hyperlipidemia, unspecified: Secondary | ICD-10-CM | POA: Insufficient documentation

## 2017-02-27 DIAGNOSIS — G4730 Sleep apnea, unspecified: Secondary | ICD-10-CM | POA: Insufficient documentation

## 2017-02-27 DIAGNOSIS — D34 Benign neoplasm of thyroid gland: Secondary | ICD-10-CM | POA: Insufficient documentation

## 2017-02-27 DIAGNOSIS — F329 Major depressive disorder, single episode, unspecified: Secondary | ICD-10-CM | POA: Insufficient documentation

## 2017-02-27 DIAGNOSIS — J449 Chronic obstructive pulmonary disease, unspecified: Secondary | ICD-10-CM | POA: Diagnosis not present

## 2017-02-27 HISTORY — PX: THYROID LOBECTOMY: SHX420

## 2017-02-27 LAB — GLUCOSE, CAPILLARY
Glucose-Capillary: 94 mg/dL (ref 65–99)
Glucose-Capillary: 107 mg/dL — ABNORMAL HIGH (ref 65–99)
Glucose-Capillary: 170 mg/dL — ABNORMAL HIGH (ref 65–99)
Glucose-Capillary: 124 mg/dL — ABNORMAL HIGH (ref 65–99)

## 2017-02-27 LAB — POCT I-STAT 4, (NA,K, GLUC, HGB,HCT)
Glucose, Bld: 177 mg/dL — ABNORMAL HIGH (ref 65–99)
HCT: 35 % — ABNORMAL LOW (ref 36.0–46.0)
Hemoglobin: 11.9 g/dL — ABNORMAL LOW (ref 12.0–15.0)
Potassium: 3.4 mmol/L — ABNORMAL LOW (ref 3.5–5.1)
Sodium: 141 mmol/L (ref 135–145)

## 2017-02-27 LAB — PROTIME-INR
INR: 0.95
Prothrombin Time: 12.6 seconds (ref 11.4–15.2)

## 2017-02-27 SURGERY — LOBECTOMY, THYROID
Anesthesia: General | Site: Neck | Laterality: Right

## 2017-02-27 MED ORDER — DIPHENHYDRAMINE HCL 50 MG/ML IJ SOLN: 12.5000 mg | Freq: Four times a day (QID) | INTRAMUSCULAR | Status: DC | PRN

## 2017-02-27 MED ORDER — SIMETHICONE 80 MG PO CHEW: 40.0000 mg | CHEWABLE_TABLET | Freq: Four times a day (QID) | ORAL | Status: DC | PRN

## 2017-02-27 MED ORDER — DIPHENHYDRAMINE HCL 12.5 MG/5ML PO ELIX: 12.5000 mg | ORAL_SOLUTION | Freq: Four times a day (QID) | ORAL | Status: DC | PRN

## 2017-02-27 MED ORDER — CANAGLIFLOZIN 100 MG PO TABS
100.0000 mg | ORAL_TABLET | Freq: Every day | ORAL | Status: DC
  Administered 2017-02-27: 100 mg via ORAL
  Filled 2017-02-27 (×3): qty 1

## 2017-02-27 MED ORDER — METFORMIN HCL 500 MG PO TABS
500.0000 mg | ORAL_TABLET | Freq: Every day | ORAL | Status: DC
  Administered 2017-02-27: 500 mg via ORAL
  Filled 2017-02-27: qty 1

## 2017-02-27 MED ORDER — DILTIAZEM HCL ER COATED BEADS 240 MG PO CP24
240.0000 mg | ORAL_CAPSULE | Freq: Every day | ORAL | Status: DC
  Administered 2017-02-27: 240 mg via ORAL
  Filled 2017-02-27: qty 1

## 2017-02-27 MED ORDER — QUETIAPINE FUMARATE 50 MG PO TABS
50.0000 mg | ORAL_TABLET | Freq: Every day | ORAL | Status: DC
  Administered 2017-02-27: 50 mg via ORAL
  Filled 2017-02-27: qty 1

## 2017-02-27 MED ORDER — PANTOPRAZOLE SODIUM 40 MG PO TBEC
40.0000 mg | DELAYED_RELEASE_TABLET | Freq: Every day | ORAL | Status: DC
  Administered 2017-02-27: 40 mg via ORAL
  Filled 2017-02-27: qty 1

## 2017-02-27 MED ORDER — HYDROMORPHONE HCL 1 MG/ML IJ SOLN
0.5000 mg | Freq: Once | INTRAMUSCULAR | Status: AC
  Administered 2017-02-27: 0.5 mg via INTRAVENOUS

## 2017-02-27 MED ORDER — POTASSIUM CHLORIDE IN NACL 20-0.9 MEQ/L-% IV SOLN
INTRAVENOUS | Status: DC
  Administered 2017-02-27 – 2017-02-28 (×2): via INTRAVENOUS
  Filled 2017-02-27 (×2): qty 1000

## 2017-02-27 MED ORDER — ALLOPURINOL 100 MG PO TABS
50.0000 mg | ORAL_TABLET | Freq: Every day | ORAL | Status: DC
  Administered 2017-02-27: 50 mg via ORAL
  Filled 2017-02-27: qty 1

## 2017-02-27 MED ORDER — LIDOCAINE 2% (20 MG/ML) 5 ML SYRINGE
INTRAMUSCULAR | Status: AC
  Filled 2017-02-27: qty 5

## 2017-02-27 MED ORDER — ONDANSETRON HCL 4 MG/2ML IJ SOLN
INTRAMUSCULAR | Status: DC | PRN
  Administered 2017-02-27: 4 mg via INTRAVENOUS

## 2017-02-27 MED ORDER — ONDANSETRON 4 MG PO TBDP: 4.0000 mg | ORAL_TABLET | Freq: Four times a day (QID) | ORAL | Status: DC | PRN

## 2017-02-27 MED ORDER — 0.9 % SODIUM CHLORIDE (POUR BTL) OPTIME
TOPICAL | Status: DC | PRN
  Administered 2017-02-27: 1000 mL

## 2017-02-27 MED ORDER — OXYCODONE HCL 5 MG PO TABS
ORAL_TABLET | ORAL | Status: AC
  Filled 2017-02-27: qty 1

## 2017-02-27 MED ORDER — HYDROMORPHONE HCL 1 MG/ML IJ SOLN
1.0000 mg | INTRAMUSCULAR | Status: DC | PRN
  Administered 2017-02-27 (×2): 1 mg via INTRAVENOUS
  Filled 2017-02-27 (×2): qty 1

## 2017-02-27 MED ORDER — LACTATED RINGERS IV SOLN
INTRAVENOUS | Status: DC
  Administered 2017-02-27 (×3): via INTRAVENOUS

## 2017-02-27 MED ORDER — DEXAMETHASONE SODIUM PHOSPHATE 10 MG/ML IJ SOLN
INTRAMUSCULAR | Status: AC
Start: 2017-02-27 — End: ?
  Filled 2017-02-27: qty 1

## 2017-02-27 MED ORDER — FENTANYL CITRATE (PF) 100 MCG/2ML IJ SOLN
INTRAMUSCULAR | Status: AC
  Filled 2017-02-27: qty 2

## 2017-02-27 MED ORDER — MAGNESIUM OXIDE 400 (241.3 MG) MG PO TABS
400.0000 mg | ORAL_TABLET | Freq: Every day | ORAL | Status: DC
  Administered 2017-02-27: 400 mg via ORAL
  Filled 2017-02-27: qty 1

## 2017-02-27 MED ORDER — HYDRALAZINE HCL 20 MG/ML IJ SOLN: 10.0000 mg | INTRAMUSCULAR | Status: DC | PRN

## 2017-02-27 MED ORDER — AMLODIPINE BESYLATE 5 MG PO TABS
5.0000 mg | ORAL_TABLET | Freq: Every day | ORAL | Status: DC
  Administered 2017-02-27: 5 mg via ORAL
  Filled 2017-02-27: qty 1

## 2017-02-27 MED ORDER — FAMOTIDINE IN NACL 20-0.9 MG/50ML-% IV SOLN
20.0000 mg | INTRAVENOUS | Status: AC
  Administered 2017-02-27: 20 mg via INTRAVENOUS
  Filled 2017-02-27: qty 50

## 2017-02-27 MED ORDER — PROPOFOL 10 MG/ML IV BOLUS
INTRAVENOUS | Status: DC | PRN
  Administered 2017-02-27: 200 mg via INTRAVENOUS

## 2017-02-27 MED ORDER — GABAPENTIN 300 MG PO CAPS
300.0000 mg | ORAL_CAPSULE | Freq: Two times a day (BID) | ORAL | Status: DC
  Administered 2017-02-27 (×2): 300 mg via ORAL
  Filled 2017-02-27 (×2): qty 1

## 2017-02-27 MED ORDER — ENOXAPARIN SODIUM 40 MG/0.4ML ~~LOC~~ SOLN
40.0000 mg | SUBCUTANEOUS | Status: DC
  Administered 2017-02-28: 40 mg via SUBCUTANEOUS
  Filled 2017-02-27: qty 0.4

## 2017-02-27 MED ORDER — GABAPENTIN 300 MG PO CAPS: 300.0000 mg | ORAL_CAPSULE | Freq: Every day | ORAL | Status: DC

## 2017-02-27 MED ORDER — POTASSIUM CHLORIDE CRYS ER 20 MEQ PO TBCR
20.0000 meq | EXTENDED_RELEASE_TABLET | Freq: Two times a day (BID) | ORAL | Status: DC
  Administered 2017-02-27: 20 meq via ORAL
  Filled 2017-02-27: qty 1

## 2017-02-27 MED ORDER — EPHEDRINE SULFATE 50 MG/ML IJ SOLN
INTRAMUSCULAR | Status: DC | PRN
  Administered 2017-02-27: 10 mg via INTRAVENOUS
  Administered 2017-02-27 (×2): 5 mg via INTRAVENOUS
  Administered 2017-02-27: 10 mg via INTRAVENOUS

## 2017-02-27 MED ORDER — HYDROMORPHONE HCL 1 MG/ML IJ SOLN
INTRAMUSCULAR | Status: AC
  Filled 2017-02-27: qty 1

## 2017-02-27 MED ORDER — DEXTROSE 5 % IV SOLN
INTRAVENOUS | Status: DC | PRN
  Administered 2017-02-27: 50 ug/min via INTRAVENOUS

## 2017-02-27 MED ORDER — SUGAMMADEX SODIUM 500 MG/5ML IV SOLN
INTRAVENOUS | Status: AC
  Filled 2017-02-27: qty 5

## 2017-02-27 MED ORDER — ZOLPIDEM TARTRATE 5 MG PO TABS: 5.0000 mg | ORAL_TABLET | Freq: Every evening | ORAL | Status: DC | PRN

## 2017-02-27 MED ORDER — PRAVASTATIN SODIUM 40 MG PO TABS
40.0000 mg | ORAL_TABLET | Freq: Every evening | ORAL | Status: DC
  Administered 2017-02-27: 40 mg via ORAL
  Filled 2017-02-27: qty 1

## 2017-02-27 MED ORDER — FENTANYL CITRATE (PF) 100 MCG/2ML IJ SOLN
INTRAMUSCULAR | Status: DC | PRN
  Administered 2017-02-27: 100 ug via INTRAVENOUS
  Administered 2017-02-27 (×3): 50 ug via INTRAVENOUS

## 2017-02-27 MED ORDER — INSULIN ASPART 100 UNIT/ML ~~LOC~~ SOLN
0.0000 [IU] | Freq: Three times a day (TID) | SUBCUTANEOUS | Status: DC
  Administered 2017-02-27: 3 [IU] via SUBCUTANEOUS
  Administered 2017-02-28: 2 [IU] via SUBCUTANEOUS

## 2017-02-27 MED ORDER — POLYETHYLENE GLYCOL 3350 17 G PO PACK: 17.0000 g | PACK | Freq: Every day | ORAL | Status: DC

## 2017-02-27 MED ORDER — EPHEDRINE 5 MG/ML INJ
INTRAVENOUS | Status: AC
  Filled 2017-02-27: qty 10

## 2017-02-27 MED ORDER — CEFAZOLIN SODIUM-DEXTROSE 2-4 GM/100ML-% IV SOLN
2.0000 g | Freq: Three times a day (TID) | INTRAVENOUS | Status: AC
  Administered 2017-02-27: 2 g via INTRAVENOUS
  Filled 2017-02-27 (×2): qty 100

## 2017-02-27 MED ORDER — ONDANSETRON HCL 4 MG/2ML IJ SOLN: 4.0000 mg | Freq: Four times a day (QID) | INTRAMUSCULAR | Status: DC | PRN

## 2017-02-27 MED ORDER — CYCLOBENZAPRINE HCL 10 MG PO TABS
10.0000 mg | ORAL_TABLET | Freq: Two times a day (BID) | ORAL | Status: DC | PRN
  Administered 2017-02-27: 10 mg via ORAL

## 2017-02-27 MED ORDER — PHENYLEPHRINE HCL 10 MG/ML IJ SOLN
INTRAMUSCULAR | Status: AC
  Filled 2017-02-27: qty 1

## 2017-02-27 MED ORDER — METHOCARBAMOL 500 MG PO TABS: 500.0000 mg | ORAL_TABLET | Freq: Four times a day (QID) | ORAL | Status: DC | PRN

## 2017-02-27 MED ORDER — ALBUTEROL SULFATE (2.5 MG/3ML) 0.083% IN NEBU: 2.5000 mg | INHALATION_SOLUTION | Freq: Four times a day (QID) | RESPIRATORY_TRACT | Status: DC | PRN

## 2017-02-27 MED ORDER — PHENYLEPHRINE 40 MCG/ML (10ML) SYRINGE FOR IV PUSH (FOR BLOOD PRESSURE SUPPORT)
PREFILLED_SYRINGE | INTRAVENOUS | Status: AC
  Filled 2017-02-27: qty 10

## 2017-02-27 MED ORDER — CYCLOBENZAPRINE HCL 10 MG PO TABS
ORAL_TABLET | ORAL | Status: AC
  Filled 2017-02-27: qty 1

## 2017-02-27 MED ORDER — CLONIDINE HCL 0.1 MG PO TABS
0.1000 mg | ORAL_TABLET | Freq: Two times a day (BID) | ORAL | Status: DC
  Administered 2017-02-27: 0.1 mg via ORAL
  Filled 2017-02-27: qty 1

## 2017-02-27 MED ORDER — MIDAZOLAM HCL 2 MG/2ML IJ SOLN
INTRAMUSCULAR | Status: AC
  Filled 2017-02-27: qty 2

## 2017-02-27 MED ORDER — PHENYLEPHRINE HCL 10 MG/ML IJ SOLN
INTRAMUSCULAR | Status: DC | PRN
  Administered 2017-02-27 (×2): 80 ug via INTRAVENOUS

## 2017-02-27 MED ORDER — ALBUTEROL SULFATE HFA 108 (90 BASE) MCG/ACT IN AERS: 2.0000 | INHALATION_SPRAY | RESPIRATORY_TRACT | Status: DC | PRN

## 2017-02-27 MED ORDER — ONDANSETRON HCL 4 MG/2ML IJ SOLN: 4.0000 mg | Freq: Once | INTRAMUSCULAR | Status: DC | PRN

## 2017-02-27 MED ORDER — MIDAZOLAM HCL 5 MG/5ML IJ SOLN
INTRAMUSCULAR | Status: DC | PRN
  Administered 2017-02-27: 1 mg via INTRAVENOUS

## 2017-02-27 MED ORDER — CHLORHEXIDINE GLUCONATE CLOTH 2 % EX PADS: 6.0000 | MEDICATED_PAD | Freq: Once | CUTANEOUS | Status: DC

## 2017-02-27 MED ORDER — DULOXETINE HCL 60 MG PO CPEP
60.0000 mg | ORAL_CAPSULE | ORAL | Status: DC
  Administered 2017-02-28: 60 mg via ORAL
  Filled 2017-02-27: qty 1

## 2017-02-27 MED ORDER — HEMOSTATIC AGENTS (NO CHARGE) OPTIME
TOPICAL | Status: DC | PRN
  Administered 2017-02-27: 1 via TOPICAL

## 2017-02-27 MED ORDER — ROCURONIUM BROMIDE 10 MG/ML (PF) SYRINGE
PREFILLED_SYRINGE | INTRAVENOUS | Status: AC
  Filled 2017-02-27: qty 5

## 2017-02-27 MED ORDER — STERILE WATER FOR IRRIGATION IR SOLN
Status: DC | PRN
  Administered 2017-02-27: 300 mL

## 2017-02-27 MED ORDER — ONDANSETRON HCL 4 MG/2ML IJ SOLN
INTRAMUSCULAR | Status: AC
  Filled 2017-02-27: qty 2

## 2017-02-27 MED ORDER — LIDOCAINE HCL (CARDIAC) 20 MG/ML IV SOLN
INTRAVENOUS | Status: DC | PRN
  Administered 2017-02-27: 60 mg via INTRAVENOUS

## 2017-02-27 MED ORDER — OXYCODONE HCL 5 MG PO TABS
5.0000 mg | ORAL_TABLET | ORAL | Status: DC | PRN
  Administered 2017-02-27: 5 mg via ORAL
  Administered 2017-02-27 – 2017-02-28 (×3): 10 mg via ORAL
  Filled 2017-02-27 (×3): qty 2

## 2017-02-27 MED ORDER — FENTANYL CITRATE (PF) 250 MCG/5ML IJ SOLN
INTRAMUSCULAR | Status: AC
  Filled 2017-02-27: qty 5

## 2017-02-27 MED ORDER — INFLUENZA VAC SPLIT QUAD 0.5 ML IM SUSY: 0.5000 mL | PREFILLED_SYRINGE | INTRAMUSCULAR | Status: DC

## 2017-02-27 MED ORDER — FENTANYL CITRATE (PF) 100 MCG/2ML IJ SOLN
25.0000 ug | INTRAMUSCULAR | Status: DC | PRN
  Administered 2017-02-27: 50 ug via INTRAVENOUS
  Administered 2017-02-27 (×2): 25 ug via INTRAVENOUS

## 2017-02-27 MED ORDER — PROPOFOL 10 MG/ML IV BOLUS
INTRAVENOUS | Status: AC
  Filled 2017-02-27: qty 20

## 2017-02-27 MED ORDER — METOCLOPRAMIDE HCL 10 MG PO TABS: 10.0000 mg | ORAL_TABLET | Freq: Four times a day (QID) | ORAL | Status: DC | PRN

## 2017-02-27 MED ORDER — SUGAMMADEX SODIUM 500 MG/5ML IV SOLN
INTRAVENOUS | Status: DC | PRN
  Administered 2017-02-27: 250 mg via INTRAVENOUS

## 2017-02-27 MED ORDER — LORATADINE 10 MG PO TABS: 10.0000 mg | ORAL_TABLET | Freq: Every day | ORAL | Status: DC | PRN

## 2017-02-27 MED ORDER — ROCURONIUM BROMIDE 100 MG/10ML IV SOLN
INTRAVENOUS | Status: DC | PRN
  Administered 2017-02-27: 50 mg via INTRAVENOUS
  Administered 2017-02-27: 20 mg via INTRAVENOUS

## 2017-02-27 SURGICAL SUPPLY — 55 items
ADH SKN CLS APL DERMABOND .7 (GAUZE/BANDAGES/DRESSINGS) ×1
ATTRACTOMAT 16X20 MAGNETIC DRP (DRAPES) ×1 IMPLANT
BLADE CLIPPER SURG (BLADE) IMPLANT
BLADE SURG 15 STRL LF DISP TIS (BLADE) IMPLANT
BLADE SURG 15 STRL SS (BLADE) ×2
CANISTER SUCT 3000ML PPV (MISCELLANEOUS) ×2 IMPLANT
CHLORAPREP W/TINT 10.5 ML (MISCELLANEOUS) ×2 IMPLANT
CLIP VESOCCLUDE MED 24/CT (CLIP) ×2 IMPLANT
CLIP VESOCCLUDE SM WIDE 24/CT (CLIP) ×2 IMPLANT
CONT SPEC 4OZ CLIKSEAL STRL BL (MISCELLANEOUS) ×1 IMPLANT
COVER SURGICAL LIGHT HANDLE (MISCELLANEOUS) ×2 IMPLANT
CRADLE DONUT ADULT HEAD (MISCELLANEOUS) ×2 IMPLANT
DERMABOND ADVANCED (GAUZE/BANDAGES/DRESSINGS) ×1
DERMABOND ADVANCED .7 DNX12 (GAUZE/BANDAGES/DRESSINGS) ×1 IMPLANT
DRAPE LAPAROTOMY 100X72 PEDS (DRAPES) ×2 IMPLANT
DRAPE UTILITY XL STRL (DRAPES) ×4 IMPLANT
ELECT CAUTERY BLADE 6.4 (BLADE) ×2 IMPLANT
ELECT REM PT RETURN 9FT ADLT (ELECTROSURGICAL) ×2
ELECTRODE REM PT RTRN 9FT ADLT (ELECTROSURGICAL) ×1 IMPLANT
GAUZE SPONGE 4X4 16PLY XRAY LF (GAUZE/BANDAGES/DRESSINGS) ×3 IMPLANT
GLOVE BIO SURGEON STRL SZ7.5 (GLOVE) ×1 IMPLANT
GLOVE BIO SURGEON STRL SZ8 (GLOVE) ×2 IMPLANT
GLOVE BIOGEL PI IND STRL 6.5 (GLOVE) IMPLANT
GLOVE BIOGEL PI IND STRL 7.5 (GLOVE) IMPLANT
GLOVE BIOGEL PI IND STRL 8 (GLOVE) ×1 IMPLANT
GLOVE BIOGEL PI INDICATOR 6.5 (GLOVE) ×1
GLOVE BIOGEL PI INDICATOR 7.5 (GLOVE) ×1
GLOVE BIOGEL PI INDICATOR 8 (GLOVE) ×3
GLOVE SURG SS PI 6.0 STRL IVOR (GLOVE) ×1 IMPLANT
GOWN STRL REUS W/ TWL LRG LVL3 (GOWN DISPOSABLE) ×3 IMPLANT
GOWN STRL REUS W/ TWL XL LVL3 (GOWN DISPOSABLE) ×1 IMPLANT
GOWN STRL REUS W/TWL LRG LVL3 (GOWN DISPOSABLE) ×6
GOWN STRL REUS W/TWL XL LVL3 (GOWN DISPOSABLE) ×2
HEMOSTAT SNOW SURGICEL 2X4 (HEMOSTASIS) ×2 IMPLANT
ILLUMINATOR WAVEGUIDE N/F (MISCELLANEOUS) ×1 IMPLANT
KIT BASIN OR (CUSTOM PROCEDURE TRAY) ×2 IMPLANT
KIT ROOM TURNOVER OR (KITS) ×2 IMPLANT
NS IRRIG 1000ML POUR BTL (IV SOLUTION) ×2 IMPLANT
PACK SURGICAL SETUP 50X90 (CUSTOM PROCEDURE TRAY) ×2 IMPLANT
PAD ARMBOARD 7.5X6 YLW CONV (MISCELLANEOUS) ×3 IMPLANT
PENCIL BUTTON HOLSTER BLD 10FT (ELECTRODE) ×2 IMPLANT
SHEARS HARMONIC 9CM CVD (BLADE) ×2 IMPLANT
SPECIMEN JAR MEDIUM (MISCELLANEOUS) IMPLANT
SPONGE INTESTINAL PEANUT (DISPOSABLE) ×2 IMPLANT
STAPLER VISISTAT 35W (STAPLE) ×2 IMPLANT
SUT MNCRL AB 4-0 PS2 18 (SUTURE) ×2 IMPLANT
SUT VIC AB 2-0 SH 18 (SUTURE) ×2 IMPLANT
SUT VIC AB 3-0 SH 18 (SUTURE) ×2 IMPLANT
SUT VICRYL AB 2 0 TIES (SUTURE) ×2 IMPLANT
SUT VICRYL AB 3 0 TIES (SUTURE) ×2 IMPLANT
SYR BULB 3OZ (MISCELLANEOUS) ×2 IMPLANT
TOWEL OR 17X24 6PK STRL BLUE (TOWEL DISPOSABLE) ×2 IMPLANT
TOWEL OR 17X26 10 PK STRL BLUE (TOWEL DISPOSABLE) ×1 IMPLANT
TUBE CONNECTING 12X1/4 (SUCTIONS) ×2 IMPLANT
WATER STERILE IRR 1000ML POUR (IV SOLUTION) ×1 IMPLANT

## 2017-02-27 NOTE — Anesthesia Preprocedure Evaluation (Addendum)
Anesthesia Evaluation  Patient identified by MRN, date of birth, ID band Patient awake    Reviewed: Allergy & Precautions, NPO status , Patient's Chart, lab work & pertinent test results, reviewed documented beta blocker date and time   Airway Mallampati: III  TM Distance: >3 FB Neck ROM: Full    Dental  (+) Missing, Poor Dentition, Partial Upper, Partial Lower   Pulmonary asthma , sleep apnea , COPD,  COPD inhaler, former smoker,    Pulmonary exam normal breath sounds clear to auscultation       Cardiovascular hypertension, Pt. on medications + Peripheral Vascular Disease and +CHF  Normal cardiovascular exam+ Valvular Problems/Murmurs  Rhythm:Regular Rate:Normal  ECG: SR, rate 68  PCP is [name], MD  Hematologist is [name], MD, who gave ok to hold coumadin for 5 days.       Neuro/Psych PSYCHIATRIC DISORDERS Depression  Neuromuscular disease    GI/Hepatic GERD  Medicated and Controlled,(+)     substance abuse  ,   Endo/Other  diabetes, Poorly Controlled, Type 2, Oral Hypoglycemic AgentsMorbid obesityHyperlipidemia Adrenal mass   Renal/GU Renal diseaseRight renal mass Right adrenal mass     Musculoskeletal  (+) Arthritis , Osteoarthritis,  Fibromyalgia -  Abdominal (+) + obese,   Peds  Hematology  (+) anemia , Hx/o lupus anticoagulant positive Anticoagulated on Coumadin admitted supratheraputic Hx/o Bilateral DVT   Anesthesia Other Findings DVT (deep venous thrombosis)  Ambulates with cane and walker  Reproductive/Obstetrics                            Anesthesia Physical  Anesthesia Plan  ASA: IV  Anesthesia Plan: General   Post-op Pain Management:    Induction: Intravenous  PONV Risk Score and Plan: 3 and Ondansetron, Dexamethasone and Midazolam  Airway Management Planned: Oral ETT  Additional Equipment:   Intra-op Plan:   Post-operative Plan:  Extubation in OR and Possible Post-op intubation/ventilation  Informed Consent: I have reviewed the patients History and Physical, chart, labs and discussed the procedure including the risks, benefits and alternatives for the proposed anesthesia with the patient or authorized representative who has indicated his/her understanding and acceptance.   Dental advisory given  Plan Discussed with: CRNA  Anesthesia Plan Comments:         Anesthesia Quick Evaluation

## 2017-02-27 NOTE — Anesthesia Procedure Notes (Signed)
Procedure Name: Intubation Date/Time: 02/27/2017 9:07 AM Performed by: Jenne Campus, CRNA Pre-anesthesia Checklist: Patient identified, Emergency Drugs available, Suction available and Patient being monitored Patient Re-evaluated:Patient Re-evaluated prior to induction Oxygen Delivery Method: Circle System Utilized Preoxygenation: Pre-oxygenation with 100% oxygen Induction Type: IV induction Ventilation: Mask ventilation without difficulty Laryngoscope Size: Mac and 4 Grade View: Grade II Tube type: Oral Tube size: 7.0 mm Number of attempts: 1 Airway Equipment and Method: Stylet and Oral airway Placement Confirmation: ETT inserted through vocal cords under direct vision,  positive ETCO2 and breath sounds checked- equal and bilateral Secured at: 20 cm Tube secured with: Tape Dental Injury: Teeth and Oropharynx as per pre-operative assessment

## 2017-02-27 NOTE — Transfer of Care (Signed)
Immediate Anesthesia Transfer of Care Note  Patient: [name]  Procedure(s) Performed: RIGHT THYROID LOBECTOMY (Right Neck)  Patient Location: PACU  Anesthesia Type:General  Level of Consciousness: awake, oriented and patient cooperative  Airway & Oxygen Therapy: Patient Spontanous Breathing and Patient connected to face mask oxygen  Post-op Assessment: Report given to RN and Post -op Vital signs reviewed and stable  Post vital signs: Reviewed  Last Vitals:  Vitals:   02/27/17 0705 02/27/17 1128  BP: (!) 167/94   Pulse: 92   Resp: 20   Temp: 36.7 C (P) 36.6 C  SpO2: 100%     Last Pain:  Vitals:   02/27/17 0705  TempSrc: Oral         Complications: No apparent anesthesia complications

## 2017-02-27 NOTE — Anesthesia Postprocedure Evaluation (Signed)
Anesthesia Post Note  Patient: [name]  Procedure(s) Performed: RIGHT THYROID LOBECTOMY (Right Neck)     Patient location during evaluation: PACU Anesthesia Type: General Level of consciousness: awake and sedated Pain management: pain level controlled Vital Signs Assessment: post-procedure vital signs reviewed and stable Respiratory status: spontaneous breathing Cardiovascular status: stable Postop Assessment: no apparent nausea or vomiting Anesthetic complications: no    Last Vitals:  Vitals:   02/27/17 1215 02/27/17 1230  BP: (!) 159/94 (!) 161/84  Pulse: 84 85  Resp: 16 20  Temp:    SpO2: 100% 100%    Last Pain:  Vitals:   02/27/17 1230  TempSrc:   PainSc: 6    Pain Severino:                 Laryn Venning JR,JOHN Oaklie Durrett

## 2017-02-27 NOTE — Op Note (Signed)
Preoperative diagnosis: Right thyroid lobe mass  Postoperative diagnosis: Same  Procedure: Right thyroid lobectomy and isthmusectomy  Surgeon: Erroll Luna MD  Anesthesia: Sharyn Dross RNFA  EBL: 100 cc  Drains: None  Anesthesia: General  Specimens: Right thyroid lobe and isthmus with mass to pathology  IV fluids: Per anesthesia record  Indications for procedure: The patient is a 61 year old female with a large right thyroid lobe mass.  Fine-needle aspiration showed atypical findings.  I discussed operative and nonoperative management of her condition with her.  I discussed the risks and benefits of surgery versus observation and follow-up.  I discussed potential risk of malignancy of being roughly 30% in the circumstances.  After discussion she desired removal of the mass.  She felt that it was getting larger and causing compression symptoms as well.  Risks were discussed.  Risk of bleeding, infection, injury to nerves to vocal cords, injury to calcium controlling glands, injury to trachea, injury to blood vessels and neck, injury to esophagus, worsening of underlying medical problems, death, DVT, airway issues, the need for more surgery, the need for extra calcium, and potentially other treatment options and/or complications discussed such as stroke, cardiovascular event, respiratory event, and the need for reoperation.  She agreed to proceed.  Description of procedure: The patient was met in the holding area.  Questions were answered.  She was taken back to the operating room and placed upon the OR table.  Both arms were tucked in general anesthesia initiated.  Patient had a previous cervical fusion therefore her neck was very stiff and we were very careful positioning her neck given that.  After prep and drape sterilely of the neck, a timeout was done.  Her previous scar from cervical fusion noted.  I used the scar to extend into the right side.  Transverse cervical incision made.   Dissection was carried down and the platysma muscle was elevated into a superior and inferior flap.  Anterior jugular veins taken with harmonic scalpel and 3-0 Vicryl stitches.  I mobilized the strap muscles from the midline to the right side to expose the right thyroid lobe.  Retractors were used.  She had enlarged right thyroid lobe.  We able to carefully dissect around the superior pole.  I took down the superior pole vessels as they entered the gland using the harmonic scalpel without difficulty.  This helped mobilize the superior pole down of the operative field since her neck was very difficult to work in due to previous cervical fusion.  Middle thyroid vein was taken with the harmonic scalpel.  The inferior thyroid vessels were taken individually along with the lower pole of the right thyroid using harmonic scalpel.  The recurrent laryngeal nerve was identified.  We rolled the gland away from this out of the tracheoesophageal groove taking care not to injure the right recurrent nerve.  The superior parathyroid gland was visualized in the left in situ.  It did not appear compromised.  The inferior parathyroid gland was not well visualized.  Has rolled the gland to the midline we used a Harmonic scalpel to divide the isthmus to take the isthmus with the right thyroid lobe.  Hemostasis achieved with Harmonic scalpel, 3-0 Vicryl stitches and Surgicel snow.  Irrigation was used and the operative field was hemostatic.  Surgicel snow was left into the right thyroid lobe bed.  Strap muscles were reapproximated with 3-0 Vicryl.  3-0 Vicryl was used to approximate the platysma muscle and 4-0 Monocryl was used to close  the skin in a subcuticular fashion.  All final counts were correct sponge, needles and instruments.  Dermabond applied.  Patient was awoke extubated taken to recovery in satisfactory condition.

## 2017-02-27 NOTE — Interval H&P Note (Signed)
History and Physical Interval Note:  02/27/2017 8:23 AM  [name]  has presented today for surgery, with the diagnosis of RIGHT THYROID MASS  The various methods of treatment have been discussed with the patient and family. After consideration of risks, benefits and other options for treatment, the patient has consented to  Procedure(s): RIGHT THYROID LOBECTOMY (Right) as a surgical intervention .  The patient's history has been reviewed, patient examined, no change in status, stable for surgery.  I have reviewed the patient's chart and labs.  Questions were answered to the patient's satisfaction.     [place]

## 2017-02-27 NOTE — Anesthesia Procedure Notes (Deleted)
Date/Time: 02/27/2017 9:37 AM Performed by: Jenne Campus, CRNA

## 2017-02-28 ENCOUNTER — Encounter (HOSPITAL_COMMUNITY): Payer: Self-pay | Admitting: Surgery

## 2017-02-28 DIAGNOSIS — E079 Disorder of thyroid, unspecified: Secondary | ICD-10-CM | POA: Diagnosis not present

## 2017-02-28 LAB — COMPREHENSIVE METABOLIC PANEL
ALT: 21 U/L (ref 14–54)
AST: 21 U/L (ref 15–41)
Albumin: 3.2 g/dL — ABNORMAL LOW (ref 3.5–5.0)
Alkaline Phosphatase: 74 U/L (ref 38–126)
Anion gap: 9 (ref 5–15)
BUN: 7 mg/dL (ref 6–20)
CO2: 27 mmol/L (ref 22–32)
Calcium: 8.1 mg/dL — ABNORMAL LOW (ref 8.9–10.3)
Chloride: 102 mmol/L (ref 101–111)
Creatinine, Ser: 0.79 mg/dL (ref 0.44–1.00)
GFR calc Af Amer: >60 mL/min (ref >60)
GFR calc non Af Amer: >60 mL/min (ref >60)
Glucose, Bld: 125 mg/dL — ABNORMAL HIGH (ref 65–99)
Potassium: 4 mmol/L (ref 3.5–5.1)
Sodium: 138 mmol/L (ref 135–145)
Total Bilirubin: 0.5 mg/dL (ref 0.3–1.2)
Total Protein: 6.6 g/dL (ref 6.5–8.1)

## 2017-02-28 LAB — GLUCOSE, CAPILLARY: Glucose-Capillary: 131 mg/dL — ABNORMAL HIGH (ref 65–99)

## 2017-02-28 LAB — CBC
HCT: 31.8 % — ABNORMAL LOW (ref 36.0–46.0)
Hemoglobin: 9.8 g/dL — ABNORMAL LOW (ref 12.0–15.0)
MCH: 26.1 pg (ref 26.0–34.0)
MCHC: 30.8 g/dL (ref 30.0–36.0)
MCV: 84.6 fL (ref 78.0–100.0)
Platelets: 288 10*3/uL (ref 150–400)
RBC: 3.76 MIL/uL — ABNORMAL LOW (ref 3.87–5.11)
RDW: 18 % — ABNORMAL HIGH (ref 11.5–15.5)
WBC: 7.9 10*3/uL (ref 4.0–10.5)

## 2017-02-28 MED ORDER — OXYCODONE HCL 5 MG PO TABS: 5.0000 mg | ORAL_TABLET | ORAL | 0 refills | Status: DC | PRN

## 2017-02-28 NOTE — Discharge Instructions (Signed)
GENERAL SURGERY: POST OP INSTRUCTIONS ° °###################################################################### ° °EAT °Gradually transition to a high fiber diet with a fiber supplement over the next few weeks after discharge.  Start with a pureed / full liquid diet (see below) ° °WALK °Walk an hour a day.  Control your pain to do that.   ° °CONTROL PAIN °Control pain so that you can walk, sleep, tolerate sneezing/coughing, go up/down stairs. ° °HAVE A BOWEL MOVEMENT DAILY °Keep your bowels regular to avoid problems.  OK to try a laxative to override constipation.  OK to use an antidairrheal to slow down diarrhea.  Call if not better after 2 tries ° °CALL IF YOU HAVE PROBLEMS/CONCERNS °Call if you are still struggling despite following these instructions. °Call if you have concerns not answered by these instructions ° °###################################################################### ° ° ° °1. DIET: Follow a light bland diet the first 24 hours after arrival home, such as soup, liquids, crackers, etc.  Be sure to include lots of fluids daily.  Avoid fast food or heavy meals as your are more likely to get nauseated.   °2. Take your usually prescribed home medications unless otherwise directed. °3. PAIN CONTROL: °a. Pain is best controlled by a usual combination of three different methods TOGETHER: °i. Ice/Heat °ii. Over the counter pain medication °iii. Prescription pain medication °b. Most patients will experience some swelling and bruising around the incisions.  Ice packs or heating pads (30-60 minutes up to 6 times a day) will help. Use ice for the first few days to help decrease swelling and bruising, then switch to heat to help relax tight/sore spots and speed recovery.  Some people prefer to use ice alone, heat alone, alternating between ice & heat.  Experiment to what works for you.  Swelling and bruising can take several weeks to resolve.   °c. It is helpful to take an over-the-counter pain medication  regularly for the first few weeks.  Choose one of the following that works best for you: °i. Naproxen (Aleve, etc)  Two 220mg tabs twice a day °ii. Ibuprofen (Advil, etc) Three 200mg tabs four times a day (every meal & bedtime) °iii. Acetaminophen (Tylenol, etc) 500-650mg four times a day (every meal & bedtime) °d. A  prescription for pain medication (such as oxycodone, hydrocodone, etc) should be given to you upon discharge.  Take your pain medication as prescribed.  °i. If you are having problems/concerns with the prescription medicine (does not control pain, nausea, vomiting, rash, itching, etc), please call us [phone] to see if we need to switch you to a different pain medicine that will work better for you and/or control your side effect better. °ii. If you need a refill on your pain medication, please contact your pharmacy.  They will contact our office to request authorization. Prescriptions will not be filled after 5 pm or on week-ends. °4. Avoid getting constipated.  Between the surgery and the pain medications, it is common to experience some constipation.  Increasing fluid intake and taking a fiber supplement (such as Metamucil, Citrucel, FiberCon, MiraLax, etc) 1-2 times a day regularly will usually help prevent this problem from occurring.  A mild laxative (prune juice, Milk of Magnesia, MiraLax, etc) should be taken according to package directions if there are no bowel movements after 48 hours.   °5. Wash / shower every day.  You may shower over the dressings as they are waterproof.  Continue to shower over incision(s) after the dressing is off. °6. Remove your waterproof bandages   5 days after surgery.  You may leave the incision open to air.  You may have skin tapes (Steri Strips) covering the incision(s).  Leave them on until one week, then remove.  You may replace a dressing/Band-Aid to cover the incision for comfort if you wish.      7. ACTIVITIES as tolerated:   a. You may resume  regular (light) daily activities beginning the next day--such as daily self-care, walking, climbing stairs--gradually increasing activities as tolerated.  If you can walk 30 minutes without difficulty, it is safe to try more intense activity such as jogging, treadmill, bicycling, low-impact aerobics, swimming, etc. b. Save the most intensive and strenuous activity for last such as sit-ups, heavy lifting, contact sports, etc  Refrain from any heavy lifting or straining until you are off narcotics for pain control.   c. DO NOT PUSH THROUGH PAIN.  Let pain be your guide: If it hurts to do something, don't do it.  Pain is your body warning you to avoid that activity for another week until the pain goes down. d. You may drive when you are no longer taking prescription pain medication, you can comfortably wear a seatbelt, and you can safely maneuver your car and apply brakes. e. Dennis Bast may have sexual intercourse when it is comfortable.  8. FOLLOW UP in our office a. Please call [facility] at [phone] to set up an appointment to see your surgeon in the office for a follow-up appointment approximately 2-3 weeks after your surgery. b. Make sure that you call for this appointment the day you arrive home to insure a convenient appointment time. 9. IF YOU HAVE DISABILITY OR FAMILY LEAVE FORMS, BRING THEM TO THE OFFICE FOR PROCESSING.  DO NOT GIVE THEM TO YOUR DOCTOR.   WHEN TO CALL us [phone]: 1. Poor pain control 2. Reactions / problems with new medications (rash/itching, nausea, etc)  3. Fever over 101.5 F (38.5 C) 4. Worsening swelling or bruising 5. Continued bleeding from incision. 6. Increased pain, redness, or drainage from the incision 7. Difficulty breathing / swallowing   The clinic staff is available to answer your questions during regular business hours (8:30am-5pm).  Please dont hesitate to call and ask to speak to one of our nurses for clinical concerns.   If you have a medical emergency,  go to the nearest emergency room or call 911.  A surgeon from [facility] is always on call at the [facility], [place], [place], [place], [place]  [address] ? MAIN: [phone] ? TOLL FREE: [phone] ?  FAX [phone] [email or web address]       RESTART COUMADIN Thursday AT REGULAR DOSE  CALL PRIMARY CARE DOCTOR TO SET UP TIME FOR BLOOD WORK WALK EVERY DAY

## 2017-02-28 NOTE — Discharge Summary (Signed)
Physician Discharge Summary  Patient ID: [name] MRN: [id number] DOB/AGE: January 08, 1956 61 y.o.  Admit date: 02/27/2017 Discharge date: 02/28/2017  Admission Diagnoses:thyroid mass  Discharge Diagnoses:  Active Problems:   Thyroid mass   Discharged Condition: good  Hospital Course: Pt did well post op.  She had good pain control and tolerated her diet.  She did have hoarseness.  She could swallow without difficulty.  She ambulated and had stable vital signs.  She had no hematoma.    Consults: None  Significant Diagnostic Studies: labs:  CBC    Component Value Date/Time   WBC 7.9 02/28/2017 0413   RBC 3.76 (L) 02/28/2017 0413   HGB 9.8 (L) 02/28/2017 0413   HGB 11.3 (L) 12/07/2016 0904   HCT 31.8 (L) 02/28/2017 0413   HCT 35.2 12/07/2016 0904   PLT 288 02/28/2017 0413   PLT 313 12/07/2016 0904   MCV 84.6 02/28/2017 0413   MCV 83.0 12/07/2016 0904   MCH 26.1 02/28/2017 0413   MCHC 30.8 02/28/2017 0413   RDW 18.0 (H) 02/28/2017 0413   RDW 17.9 (H) 12/07/2016 0904   LYMPHSABS 2.3 02/22/2017 1221   LYMPHSABS 1.9 12/07/2016 0904   MONOABS 0.4 02/22/2017 1221   MONOABS 0.4 12/07/2016 0904   EOSABS 0.1 02/22/2017 1221   EOSABS 0.1 12/07/2016 0904   BASOSABS 0.0 02/22/2017 1221   BASOSABS 0.0 12/07/2016 0904    CMP Latest Ref Rng & Units 02/28/2017 02/27/2017 02/22/2017  Glucose 65 - 99 mg/dL 125(H) 177(H) 142(H)  BUN 6 - 20 mg/dL 7 - 9  Creatinine 0.44 - 1.00 mg/dL 0.79 - 0.75  Sodium 135 - 145 mmol/L 138 141 138  Potassium 3.5 - 5.1 mmol/L 4.0 3.4(L) 2.7(LL)  Chloride 101 - 111 mmol/L 102 - 99(L)  CO2 22 - 32 mmol/L 27 - 24  Calcium 8.9 - 10.3 mg/dL 8.1(L) - 8.5(L)  Total Protein 6.5 - 8.1 g/dL 6.6 - 7.7  Total Bilirubin 0.3 - 1.2 mg/dL 0.5 - 0.5  Alkaline Phos 38 - 126 U/L 74 - 96  AST 15 - 41 U/L 21 - 32  ALT 14 - 54 U/L 21 - 49    Treatments: surgery: right thyroid lobectomy   Discharge Exam: Blood pressure (!) 143/75, pulse 88, temperature 98.4 F (36.9  C), temperature source Oral, resp. rate 18, height 5\' 6"  (1.676 m), weight 119.7 kg (264 lb), SpO2 97 %. General appearance: alert and cooperative Resp: clear to auscultation bilaterally Cardio: regular rate and rhythm, S1, S2 normal, no murmur, click, rub or gallop Incision/Wound:CDI no hematoma   Hoarseness noted   Disposition: 01-Home or Self Care  Discharge Instructions    Diet - low sodium heart healthy   Complete by:  As directed    Increase activity slowly   Complete by:  As directed         Signed: Joyice Faster Taliyah Watrous 02/28/2017, 7:23 AM

## 2017-02-28 NOTE — Progress Notes (Signed)
Discharge home. Home discharge instruction given, no question verbalized. 

## 2017-03-01 ENCOUNTER — Other Ambulatory Visit: Payer: Medicare Other

## 2017-03-02 ENCOUNTER — Other Ambulatory Visit: Payer: Self-pay | Admitting: Oncology

## 2017-03-02 NOTE — Addendum Note (Signed)
Addended by: Brien Few on: 03/02/2017 06:13 PM   Modules accepted: Orders

## 2017-03-05 ENCOUNTER — Ambulatory Visit: Payer: Medicare Other | Admitting: Podiatry

## 2017-03-05 NOTE — H&P (Signed)
[name] is an 62 y.o. female.   Chief Complaint: thyroid mass HPI: Pt presents for right thyroid lobectomy for right thyroid mass  Past Medical History:  Diagnosis Date  . Adrenal mass ([place]) 03/226   Benign  . Arthritis    knees/multiple orthopedic conditons; lower back  . Asthma    per pt  . 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 Depression   . Diabetes mellitus    120s usually fasting -  dx more than 10 yrs ago  . Dizziness, nonspecific   . DVT (deep venous thrombosis) (HCC)    Recurrent  . Fibromyalgia   . GERD (gastroesophageal reflux disease)   . Heart murmur   . History of blood transfusion   . History of cocaine abuse    Remote history   . Hypertension    takes meds daily  . Hypothyroidism   . Lupus Anticoagulant Positive   . On home oxygen therapy    at night  . Shortness of breath    exertion or lying flat  . Sleep apnea    2l of oxygen at night (as of 12/6, she used to)    Past Surgical History:  Procedure Laterality Date  . ABDOMINAL HYSTERECTOMY  1989   Fibroids  . ANTERIOR LUMBAR FUSION  01/03/2012   Procedure: ANTERIOR LUMBAR FUSION 1 LEVEL;  Surgeon: Winfield Cunas, MD;  Location: [facility];  Service: Neurosurgery;  Laterality: N/A;  Lumbar Four-Five Anterior Lumbar Interbody Fusion with Instrumentation  . BACK SURGERY     cervical spine---disk disease  . CARPAL TUNNEL RELEASE     Bilateral  . CESAREAN SECTION    . CHOLECYSTECTOMY    . CYSTOSCOPY/RETROGRADE/URETEROSCOPY/STONE EXTRACTION WITH BASKET Right 04/26/2014   Procedure: CYSTOSCOPY/ RIGHT RETROGRADE/ RIGHT URETEROSCOPY/URETERAL AND RENAL PELVIS BIOPSY;  Surgeon: Alexis Frock, MD;  Location: [facility];  Service: Urology;  Laterality: Right;  . gallstones removed    . 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   bilateral  . right elbow surgery    . THYROID LOBECTOMY Right  02/27/2017  . THYROID LOBECTOMY Right 02/27/2017   Procedure: RIGHT THYROID LOBECTOMY;  Surgeon: Erroll Luna, MD;  Location: [facility];  Service: General;  Laterality: Right;  . TUBAL LIGATION      Family History  Problem Relation Age of Onset  . Hypertension Mother   . Diabetes Mother   . Cancer Mother        Pancreatic  . Hypertension Father   . Heart failure Father    Social History:  reports that she quit smoking about 13 years ago. She started smoking about 15 years ago. she has never used smokeless tobacco. She reports that she uses drugs. Drug: Cocaine. She reports that she does not drink alcohol.  Allergies:  Allergies  Allergen Reactions  . Lisinopril Swelling    THROAT SWELLING Pt states her throat started to "close up" after being on it for "so long."  . Corticosteroids Rash    States she developed a rash on her tongue after a steroid injection in her bac    No medications prior to admission.    No results found for this or any previous visit (from the past 48 hour(s)). No results found.  Review of Systems  Constitutional: Negative for chills and fever.  HENT: Negative for hearing loss.   Eyes: Negative for blurred  vision.  Respiratory: Negative for cough.   Cardiovascular: Negative for chest pain.  Gastrointestinal: Negative for heartburn and nausea.  Genitourinary: Negative for dysuria.  Musculoskeletal: Negative for myalgias.  Skin: Negative for rash.    Blood pressure (!) 143/75, pulse 88, temperature 98.4 F (36.9 C), temperature source Oral, resp. rate 18, height 5\' 6"  (1.676 m), weight 119.7 kg (264 lb), SpO2 97 %. Physical Exam  Constitutional: She is oriented to person, place, and time. She appears well-developed and well-nourished.  HENT:  Head: Normocephalic and atraumatic.  Eyes: EOM are normal. Pupils are equal, round, and reactive to light.  Neck: Thyromegaly present.  Cardiovascular: Normal rate.  Respiratory: Effort normal.  GI: Soft.   Musculoskeletal: Normal range of motion.  Lymphadenopathy:    She has no cervical adenopathy.  Neurological: She is alert and oriented to person, place, and time.  Skin: Skin is warm and dry.     Assessment/Plan Right thyroid mas Admit post op after right thyroid lobectomy   Turner Daniels, MD 03/05/2017, 9:59 AM

## 2017-03-08 ENCOUNTER — Other Ambulatory Visit: Payer: Self-pay

## 2017-03-08 ENCOUNTER — Other Ambulatory Visit (HOSPITAL_BASED_OUTPATIENT_CLINIC_OR_DEPARTMENT_OTHER): Payer: Medicare Other

## 2017-03-08 ENCOUNTER — Telehealth: Payer: Self-pay

## 2017-03-08 DIAGNOSIS — Z86718 Personal history of other venous thrombosis and embolism: Secondary | ICD-10-CM

## 2017-03-08 DIAGNOSIS — Z7901 Long term (current) use of anticoagulants: Secondary | ICD-10-CM

## 2017-03-08 DIAGNOSIS — I82403 Acute embolism and thrombosis of unspecified deep veins of lower extremity, bilateral: Secondary | ICD-10-CM

## 2017-03-08 DIAGNOSIS — R760 Raised antibody titer: Secondary | ICD-10-CM

## 2017-03-08 LAB — PROTIME-INR
INR: 1.4 — ABNORMAL LOW (ref 2.00–3.50)
Protime: 16.8 Seconds — ABNORMAL HIGH (ref 10.6–13.4)

## 2017-03-08 NOTE — Telephone Encounter (Signed)
Called patient regarding PT-INR from 12/20. Patient reported no changes in diet or medications. Reported that she "stopped taking it for a few days last week because of surgery". Patient states "I started back on it last Thursday" (03/01/17). Per Dr. Benay Spice patient instructed to continue same dosing and schedule and we will reassess labs in 1 week. Patient voiced understanding.

## 2017-03-12 ENCOUNTER — Ambulatory Visit: Payer: Medicare Other | Admitting: Podiatry

## 2017-03-12 ENCOUNTER — Telehealth: Payer: Self-pay | Admitting: Oncology

## 2017-03-12 NOTE — Telephone Encounter (Signed)
Scheduled appt per 12/20 sch message - Unable to come in  12/27 due to transportation - patient is aware of appt date and time.

## 2017-03-16 ENCOUNTER — Telehealth: Payer: Self-pay

## 2017-03-16 ENCOUNTER — Other Ambulatory Visit: Payer: Medicare Other

## 2017-03-16 NOTE — Telephone Encounter (Signed)
Called patient to confirm that she is still taking same Coumadin regimen. Patient confirmed. Patient unable to come next week due to transportation issues for a repeat check of lab work. States "I'll be there on the 10th". Unable to make it in before then. Instructed patient to call with any issues or symptoms. Voiced understanding.

## 2017-03-29 ENCOUNTER — Telehealth: Payer: Self-pay | Admitting: Oncology

## 2017-03-29 ENCOUNTER — Inpatient Hospital Stay: Payer: Medicare Other

## 2017-03-29 ENCOUNTER — Inpatient Hospital Stay: Payer: Medicare Other | Attending: Oncology | Admitting: Oncology

## 2017-03-29 VITALS — BP 150/81 | HR 81 | Temp 97.9°F | Resp 17 | Ht 66.0 in | Wt 268.2 lb

## 2017-03-29 DIAGNOSIS — Z7901 Long term (current) use of anticoagulants: Secondary | ICD-10-CM | POA: Diagnosis not present

## 2017-03-29 DIAGNOSIS — I82403 Acute embolism and thrombosis of unspecified deep veins of lower extremity, bilateral: Secondary | ICD-10-CM

## 2017-03-29 DIAGNOSIS — Z86718 Personal history of other venous thrombosis and embolism: Secondary | ICD-10-CM | POA: Diagnosis present

## 2017-03-29 DIAGNOSIS — D6862 Lupus anticoagulant syndrome: Secondary | ICD-10-CM | POA: Insufficient documentation

## 2017-03-29 DIAGNOSIS — R760 Raised antibody titer: Secondary | ICD-10-CM

## 2017-03-29 DIAGNOSIS — D649 Anemia, unspecified: Secondary | ICD-10-CM

## 2017-03-29 LAB — PROTIME-INR
INR: 1.48
Prothrombin Time: 17.8 seconds — ABNORMAL HIGH (ref 11.4–15.2)

## 2017-03-29 NOTE — Progress Notes (Signed)
  [place] OFFICE PROGRESS NOTE   Diagnosis: History of deep vein thrombosis  INTERVAL HISTORY:   [name] returns as scheduled.  She underwent a right thyroid lobectomy by Dr. Brantley Stage 02/27/2017.  The pathology revealed no evidence of malignancy.  She was seen in the emergency room in November after a syncope event  She continues Coumadin anticoagulation.  No bleeding or symptom of thrombosis.  She is trying to lose weight with a change in her diet. Objective:  Vital signs in last 24 hours:  Blood pressure (!) 150/81, pulse 81, temperature 97.9 F (36.6 C), temperature source Oral, resp. rate 17, height 5\' 6"  (1.676 m), weight 268 lb 3.2 oz (121.7 kg), SpO2 93 %.    HEENT: Healed incision at the lower anterior neck Resp: Clear bilaterally Cardio: Regular rate and rhythm GI: No hepatomegaly, nontender, no mass Vascular: No leg edema   Lab Results:  Lab Results  Component Value Date   WBC 7.9 02/28/2017   HGB 9.8 (L) 02/28/2017   HCT 31.8 (L) 02/28/2017   MCV 84.6 02/28/2017   PLT 288 02/28/2017   NEUTROABS 2.6 02/22/2017     Lab Results  Component Value Date   INR 1.48 03/29/2017    Medications: I have reviewed the patient's current medications.   Assessment/Plan: 1. History of bilateral lower extremity deep vein thrombosis. She continues Coumadin anticoagulation. 2. History of a positive lupus anticoagulant. 3. History of a positive beta-2-glycoprotein IgA antibody. 4. Chronic bilateral leg pain - negative bilateral Doppler January 2010. 5. History of hypertension. 6. COPD. 7. Status post hysterectomy. 8. Remote history of cocaine abuse. 9. History of an adrenal lesion on a CT scan in 2008 - status post a repeat CT on June 16, 2009, showing slow enlargement of the right adrenal nodule since 2006, most consistent with an adenoma. Stable on a CT 04/24/2014 10. History of rectal and vaginal bleeding in May 2009.  11. Status post carpal  tunnel surgery. 12. 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 Right knee replacement September 2011. 13. Multiple orthopedic conditions. 14. Placement of IVC filter preoperatively before knee replacement. The IVC filter was retrieved 03/01/2011. 15. Anemia. Ferritin in normal range at 82 on 08/18/2013; 61 on 07/10/2016 16. Status post lumbar fusion surgery October 2013 17. Right hydronephrosis-evaluated by Dr. Tresa Moore 18. Left ankle swelling/pain 07/24/2016 19. Mild anemia noted on hospital admission 2018       Disposition: Ms. Jolley appears unchanged.  The PT/INR is subtherapeutic today.  She says she has been taking a 5 mg dose of Coumadin for the past several days.  She will resume Coumadin at a dose of 5 mg daily with 10 mg on Monday and Thursday.  She will return for a PT/INR check in 3 weeks.  We will adjust the Coumadin dose then as indicated.  She had mild anemia on several occasions in 2018.  We will repeat a CBC and ferritin level when she returns in 3 weeks.  She denies bleeding.  She plans to schedule a colonoscopy with Dr. Benson Norway  Ms. Bogdan be scheduled for an office visit in 6 months.  We will manage the Coumadin anticoagulation in the interim.     [name], MD  03/29/2017  9:26 AM

## 2017-03-29 NOTE — Telephone Encounter (Signed)
Scheduled appt per 1/10 los - Gave patient AVS and calender per los.  

## 2017-04-19 ENCOUNTER — Other Ambulatory Visit: Payer: Medicare Other

## 2017-05-04 ENCOUNTER — Other Ambulatory Visit: Payer: Self-pay | Admitting: Oncology

## 2017-05-09 ENCOUNTER — Other Ambulatory Visit: Payer: Self-pay

## 2017-05-09 DIAGNOSIS — Z7901 Long term (current) use of anticoagulants: Secondary | ICD-10-CM

## 2017-05-10 ENCOUNTER — Telehealth: Payer: Self-pay | Admitting: Oncology

## 2017-05-10 NOTE — Telephone Encounter (Signed)
Scheduled appt per 2/20 sch msg - spoke with patient and put on 2/25 due to their availability.

## 2017-05-11 ENCOUNTER — Other Ambulatory Visit: Payer: Medicare Other

## 2017-05-14 ENCOUNTER — Telehealth: Payer: Self-pay

## 2017-05-14 ENCOUNTER — Other Ambulatory Visit: Payer: Medicare Other

## 2017-05-14 ENCOUNTER — Telehealth: Payer: Self-pay | Admitting: Oncology

## 2017-05-14 NOTE — Telephone Encounter (Signed)
Returned call to pt regarding appt. Pt states "I am not feeling good today, I can't make it in, I am in pain on my R side where my adrenal mass is". Pt also states "I have an appt with my kidney Dr on March 25, but I am going to call him today to see if he can work me in earlier, he manages the pain and the mass". This RN voiced understanding and per pt lab appt can be rescheduled to "Friday after 12pm". Scheduling message sent.

## 2017-05-14 NOTE — Telephone Encounter (Signed)
R/s appt per 2/25 sch message - left message with appt date and time   

## 2017-05-18 ENCOUNTER — Other Ambulatory Visit: Payer: Medicare Other

## 2017-05-22 ENCOUNTER — Telehealth: Payer: Self-pay | Admitting: *Deleted

## 2017-05-22 ENCOUNTER — Inpatient Hospital Stay: Payer: Medicare Other | Attending: Oncology

## 2017-05-22 DIAGNOSIS — I82403 Acute embolism and thrombosis of unspecified deep veins of lower extremity, bilateral: Secondary | ICD-10-CM | POA: Diagnosis not present

## 2017-05-22 DIAGNOSIS — Z7901 Long term (current) use of anticoagulants: Secondary | ICD-10-CM

## 2017-05-22 DIAGNOSIS — D649 Anemia, unspecified: Secondary | ICD-10-CM

## 2017-05-22 DIAGNOSIS — Z86718 Personal history of other venous thrombosis and embolism: Secondary | ICD-10-CM | POA: Diagnosis present

## 2017-05-22 LAB — PROTIME-INR
INR: 1.26
Prothrombin Time: 15.7 seconds — ABNORMAL HIGH (ref 11.4–15.2)

## 2017-05-22 LAB — CBC WITH DIFFERENTIAL (CANCER CENTER ONLY)
Basophils Absolute: 0 10*3/uL (ref 0.0–0.1)
Basophils Relative: 0 %
Eosinophils Absolute: 0.1 10*3/uL (ref 0.0–0.5)
Eosinophils Relative: 2 %
HCT: 34.5 % — ABNORMAL LOW (ref 34.8–46.6)
Hemoglobin: 10.9 g/dL — ABNORMAL LOW (ref 11.6–15.9)
Lymphocytes Relative: 30 %
Lymphs Abs: 1.7 10*3/uL (ref 0.9–3.3)
MCH: 25.6 pg (ref 25.1–34.0)
MCHC: 31.6 g/dL (ref 31.5–36.0)
MCV: 81 fL (ref 79.5–101.0)
Monocytes Absolute: 0.4 10*3/uL (ref 0.1–0.9)
Monocytes Relative: 7 %
Neutro Abs: 3.5 10*3/uL (ref 1.5–6.5)
Neutrophils Relative %: 61 %
Platelet Count: 334 10*3/uL (ref 145–400)
RBC: 4.26 MIL/uL (ref 3.70–5.45)
RDW: 17.2 % — ABNORMAL HIGH (ref 11.2–14.5)
WBC Count: 5.7 10*3/uL (ref 3.9–10.3)

## 2017-05-22 LAB — FERRITIN: Ferritin: 21 ng/mL (ref 9–269)

## 2017-05-22 NOTE — Telephone Encounter (Signed)
Called pt, she confirmed Coumadin dose. No missed doses. Instructed her to increase Coumadin to 10mg  every MWF and 5mg  all other days. Informed her of iron and hemoglobin levels as well. She voiced understanding. Message to schedulers for lab appt in 3 weeks and 2 mos.

## 2017-05-22 NOTE — Telephone Encounter (Signed)
-----   Message from Ladell Pier, MD sent at 05/22/2017  2:35 PM EST ----- Please call patient, the PT INR is subtherapeutic.  Be sure she is taking Coumadin as prescribed.  If yes, increase the Coumadin dose to 10 mg on Monday, Wednesday, and Friday, 5 mg on other days, repeat PT/INR in 3 weeks  Iron level is in the low normal range, hemoglobin has improved since December.  Schedule CBC and ferritin level for approximately 2 months

## 2017-05-23 ENCOUNTER — Telehealth: Payer: Self-pay | Admitting: Oncology

## 2017-05-23 NOTE — Telephone Encounter (Signed)
Scheduled appt per 3/5 sch message - patient is aware of appt date and time.

## 2017-05-29 ENCOUNTER — Other Ambulatory Visit: Payer: Self-pay | Admitting: Oncology

## 2017-06-12 ENCOUNTER — Inpatient Hospital Stay: Payer: Medicare Other

## 2017-06-12 ENCOUNTER — Telehealth: Payer: Self-pay

## 2017-06-12 DIAGNOSIS — I82403 Acute embolism and thrombosis of unspecified deep veins of lower extremity, bilateral: Secondary | ICD-10-CM

## 2017-06-12 DIAGNOSIS — Z7901 Long term (current) use of anticoagulants: Secondary | ICD-10-CM

## 2017-06-12 LAB — PROTIME-INR
INR: 2.55
Prothrombin Time: 27.3 seconds — ABNORMAL HIGH (ref 11.4–15.2)

## 2017-06-12 NOTE — Telephone Encounter (Addendum)
Pt voiced understanding of message below.   ----- Message from Ladell Pier, MD sent at 06/12/2017  1:59 PM EDT ----- Please call patient, PT/INR is therapeutic, continue same Coumadin dose, check PT/INR in 4 weeks

## 2017-06-13 ENCOUNTER — Telehealth: Payer: Self-pay | Admitting: Oncology

## 2017-06-13 NOTE — Telephone Encounter (Signed)
Scheduled appt per 3/26 sch message - left message with appt date  And time.

## 2017-07-10 ENCOUNTER — Inpatient Hospital Stay: Payer: Medicare Other | Attending: Oncology

## 2017-07-10 DIAGNOSIS — Z7901 Long term (current) use of anticoagulants: Secondary | ICD-10-CM | POA: Diagnosis not present

## 2017-07-10 DIAGNOSIS — Z86718 Personal history of other venous thrombosis and embolism: Secondary | ICD-10-CM | POA: Diagnosis present

## 2017-07-10 LAB — PROTIME-INR
INR: 1.17
Prothrombin Time: 14.8 seconds (ref 11.4–15.2)

## 2017-07-12 ENCOUNTER — Telehealth: Payer: Self-pay

## 2017-07-12 ENCOUNTER — Telehealth: Payer: Self-pay | Admitting: Oncology

## 2017-07-12 DIAGNOSIS — Z7901 Long term (current) use of anticoagulants: Secondary | ICD-10-CM

## 2017-07-12 DIAGNOSIS — D649 Anemia, unspecified: Secondary | ICD-10-CM

## 2017-07-12 NOTE — Telephone Encounter (Signed)
Appointment scheduled per 4/25 los and patient has been notified

## 2017-07-12 NOTE — Telephone Encounter (Signed)
Pt reports taking Coumadin as instructed at 10mg  MWF and 5mg  the other days. Pt does report decrease in food intake as the only change. Per MD, pt to continue current dose and come back in 1 week for labs recheck. Pt voiced understanding

## 2017-07-19 ENCOUNTER — Inpatient Hospital Stay: Payer: Medicare Other | Attending: Oncology

## 2017-07-19 ENCOUNTER — Other Ambulatory Visit: Payer: Self-pay | Admitting: Oncology

## 2017-07-19 DIAGNOSIS — Z86718 Personal history of other venous thrombosis and embolism: Secondary | ICD-10-CM | POA: Diagnosis present

## 2017-07-19 DIAGNOSIS — Z7901 Long term (current) use of anticoagulants: Secondary | ICD-10-CM | POA: Diagnosis not present

## 2017-07-19 DIAGNOSIS — D649 Anemia, unspecified: Secondary | ICD-10-CM | POA: Diagnosis not present

## 2017-07-19 LAB — PROTIME-INR
INR: 2.48
Prothrombin Time: 26.6 seconds — ABNORMAL HIGH (ref 11.4–15.2)

## 2017-07-19 NOTE — Telephone Encounter (Signed)
-----   Message from Colleen Pier, MD sent at 07/19/2017  1:37 PM EDT ----- Please call patient, continue Coumadin-same dose, repeat PT/INR in 1 month

## 2017-07-19 NOTE — Telephone Encounter (Signed)
Pt reports she takes 5mg  on Sun, Mon and Tues. Takes 10mg  all other days. Instructed her to continue same dose and recheck lab in one month, per Dr. Benay Spice. Pt voiced understanding.

## 2017-07-20 ENCOUNTER — Telehealth: Payer: Self-pay | Admitting: Oncology

## 2017-07-20 NOTE — Telephone Encounter (Signed)
Scheduled appt per 5/2 sch message - pt is aware of appt date and time.

## 2017-07-23 ENCOUNTER — Telehealth: Payer: Self-pay

## 2017-07-23 ENCOUNTER — Inpatient Hospital Stay: Payer: Medicare Other

## 2017-07-23 DIAGNOSIS — D649 Anemia, unspecified: Secondary | ICD-10-CM

## 2017-07-23 DIAGNOSIS — Z86718 Personal history of other venous thrombosis and embolism: Secondary | ICD-10-CM | POA: Diagnosis not present

## 2017-07-23 DIAGNOSIS — Z7901 Long term (current) use of anticoagulants: Secondary | ICD-10-CM

## 2017-07-23 LAB — CBC WITH DIFFERENTIAL (CANCER CENTER ONLY)
Basophils Absolute: 0 10*3/uL (ref 0.0–0.1)
Basophils Relative: 1 %
Eosinophils Absolute: 0.1 10*3/uL (ref 0.0–0.5)
Eosinophils Relative: 2 %
HCT: 34.6 % — ABNORMAL LOW (ref 34.8–46.6)
Hemoglobin: 11.1 g/dL — ABNORMAL LOW (ref 11.6–15.9)
Lymphocytes Relative: 33 %
Lymphs Abs: 1.5 10*3/uL (ref 0.9–3.3)
MCH: 25.4 pg (ref 25.1–34.0)
MCHC: 32.2 g/dL (ref 31.5–36.0)
MCV: 78.9 fL — ABNORMAL LOW (ref 79.5–101.0)
Monocytes Absolute: 0.3 10*3/uL (ref 0.1–0.9)
Monocytes Relative: 7 %
Neutro Abs: 2.6 10*3/uL (ref 1.5–6.5)
Neutrophils Relative %: 57 %
Platelet Count: 338 10*3/uL (ref 145–400)
RBC: 4.38 MIL/uL (ref 3.70–5.45)
RDW: 18.8 % — ABNORMAL HIGH (ref 11.2–14.5)
WBC Count: 4.6 10*3/uL (ref 3.9–10.3)

## 2017-07-23 LAB — FERRITIN: Ferritin: 18 ng/mL (ref 9–269)

## 2017-07-23 LAB — PROTIME-INR
INR: 2.36
Prothrombin Time: 25.6 seconds — ABNORMAL HIGH (ref 11.4–15.2)

## 2017-07-23 NOTE — Telephone Encounter (Signed)
Pt to continue same coumadin dose per MD. Also will obtain cbc and hemoccult stool with next lab visit due to ferritin anf hgb levels per MD. Pt voiced understanding. Pt also states that she has recently had a colonoscopy, will call Dr. Ulyses Amor office to follow up.

## 2017-08-03 ENCOUNTER — Telehealth: Payer: Self-pay | Admitting: *Deleted

## 2017-08-03 NOTE — Telephone Encounter (Signed)
Called Dr. Ulyses Amor office: they will not release colonoscopy report since pt "has never mentioned seeing Dr. Benay Spice." Called pt, instructed her to sign release form here or at Dr. Ulyses Amor office. She agreed to do so.

## 2017-08-17 ENCOUNTER — Ambulatory Visit (INDEPENDENT_AMBULATORY_CARE_PROVIDER_SITE_OTHER): Payer: Medicare Other | Admitting: Podiatry

## 2017-08-17 ENCOUNTER — Encounter: Payer: Self-pay | Admitting: Podiatry

## 2017-08-17 DIAGNOSIS — M79674 Pain in right toe(s): Secondary | ICD-10-CM | POA: Diagnosis not present

## 2017-08-17 DIAGNOSIS — M79675 Pain in left toe(s): Secondary | ICD-10-CM

## 2017-08-17 DIAGNOSIS — Z9229 Personal history of other drug therapy: Secondary | ICD-10-CM

## 2017-08-17 DIAGNOSIS — B351 Tinea unguium: Secondary | ICD-10-CM

## 2017-08-19 NOTE — Progress Notes (Signed)
Subjective: [name] is a 62 y.o. pleasant AAF who presents today with cc of painful, discolored, thick toenails which interfere with activities of daily living. Pain is aggravated when wearing enclosed shoe gear. Pain is getting progressively worse and relieved with periodic professional debridement. She relates right great toe nailplate has been more problematic to the point where she has taken a needle and lanced under the toenail and noted fluid expressed from area under her toenail. She states she has done this in the past and has no active issue on today.  Objective:  Vascular Examination: Capillary refill time <3 seconds x 10 digits Dorsalis pedis pulses and Posterior tibial pulses present b/l No digital hair x 10 digits Skin temperature WNL b/l  Dermatological Examination: Turgor, texture and tone normal b/l LE Toenails 1-5 b/l discolored, thick, dystrophic with subungual debris and pain with palpation to nailbeds due to thickness of nails.  Right great toe nailplate thick, dystrophic with subungual debris and keratotic tissue of medial and lateral nail borders. No underlying flocculence nor fluid collection appreciated today. No erythema, no edema, no tenderness, no drainage.  Musculoskeletal: Muscle strength 5/5 to all LE muscle groups  Neurological: Sensation intact with 10 gram monofilament. Vibratory sensation intact.  Assessment: Painful onychomycosis toenails 1-5 b/l in patient on blood thinner.   Plan: 1. Toenails 1-5 b/l were debrided in length and girth without iatrogenic bleeding. 2. Patient to continue soft, supportive shoe gear 3. Patient to report any pedal injuries to medical professional immediately. 4. Avoid self trimming due to use of blood thinner. 5. Follow up 3 months 6. Patient/POA to call should there be a concern in the interim.

## 2017-08-20 ENCOUNTER — Inpatient Hospital Stay: Payer: Medicare Other | Attending: Oncology

## 2017-08-20 ENCOUNTER — Inpatient Hospital Stay: Payer: Medicare Other

## 2017-08-20 DIAGNOSIS — Z86718 Personal history of other venous thrombosis and embolism: Secondary | ICD-10-CM | POA: Insufficient documentation

## 2017-08-20 DIAGNOSIS — Z7901 Long term (current) use of anticoagulants: Secondary | ICD-10-CM

## 2017-08-20 DIAGNOSIS — I82403 Acute embolism and thrombosis of unspecified deep veins of lower extremity, bilateral: Secondary | ICD-10-CM

## 2017-08-20 LAB — CBC WITH DIFFERENTIAL (CANCER CENTER ONLY)
Basophils Absolute: 0 10*3/uL (ref 0.0–0.1)
Basophils Relative: 1 %
Eosinophils Absolute: 0.1 10*3/uL (ref 0.0–0.5)
Eosinophils Relative: 3 %
HCT: 33.9 % — ABNORMAL LOW (ref 34.8–46.6)
Hemoglobin: 11 g/dL — ABNORMAL LOW (ref 11.6–15.9)
Lymphocytes Relative: 36 %
Lymphs Abs: 1.6 10*3/uL (ref 0.9–3.3)
MCH: 26 pg (ref 25.1–34.0)
MCHC: 32.4 g/dL (ref 31.5–36.0)
MCV: 80.3 fL (ref 79.5–101.0)
Monocytes Absolute: 0.4 10*3/uL (ref 0.1–0.9)
Monocytes Relative: 9 %
Neutro Abs: 2.4 10*3/uL (ref 1.5–6.5)
Neutrophils Relative %: 51 %
Platelet Count: 297 10*3/uL (ref 145–400)
RBC: 4.22 MIL/uL (ref 3.70–5.45)
RDW: 18.7 % — ABNORMAL HIGH (ref 11.2–14.5)
WBC Count: 4.6 10*3/uL (ref 3.9–10.3)

## 2017-08-20 LAB — PROTIME-INR
INR: 1.59
Prothrombin Time: 18.8 seconds — ABNORMAL HIGH (ref 11.4–15.2)

## 2017-08-21 ENCOUNTER — Telehealth: Payer: Self-pay

## 2017-08-21 DIAGNOSIS — Z7901 Long term (current) use of anticoagulants: Secondary | ICD-10-CM

## 2017-08-21 NOTE — Telephone Encounter (Signed)
Spoke with pt about coumadin. Per MD, pt to continue same coumadin does of 10mg  MWF and 5mg  all other days. Pt voiced understanding and confirmed that she is consistently taking coumadin as directed. Pt to come back next month for labs and office visit. Pt voiced understanding

## 2017-08-24 ENCOUNTER — Encounter: Payer: Self-pay | Admitting: Podiatry

## 2017-08-25 ENCOUNTER — Other Ambulatory Visit: Payer: Self-pay | Admitting: Oncology

## 2017-09-26 ENCOUNTER — Telehealth: Payer: Self-pay | Admitting: Oncology

## 2017-09-26 ENCOUNTER — Inpatient Hospital Stay: Payer: Medicare Other

## 2017-09-26 ENCOUNTER — Inpatient Hospital Stay: Payer: Medicare Other | Attending: Oncology | Admitting: Nurse Practitioner

## 2017-09-26 ENCOUNTER — Encounter: Payer: Self-pay | Admitting: Nurse Practitioner

## 2017-09-26 VITALS — BP 153/71 | HR 90 | Temp 98.2°F | Resp 18 | Ht 66.0 in | Wt 269.6 lb

## 2017-09-26 DIAGNOSIS — J449 Chronic obstructive pulmonary disease, unspecified: Secondary | ICD-10-CM

## 2017-09-26 DIAGNOSIS — D649 Anemia, unspecified: Secondary | ICD-10-CM | POA: Diagnosis not present

## 2017-09-26 DIAGNOSIS — D6862 Lupus anticoagulant syndrome: Secondary | ICD-10-CM

## 2017-09-26 DIAGNOSIS — Z7901 Long term (current) use of anticoagulants: Secondary | ICD-10-CM | POA: Diagnosis not present

## 2017-09-26 DIAGNOSIS — Z86718 Personal history of other venous thrombosis and embolism: Secondary | ICD-10-CM | POA: Insufficient documentation

## 2017-09-26 DIAGNOSIS — I10 Essential (primary) hypertension: Secondary | ICD-10-CM | POA: Diagnosis not present

## 2017-09-26 LAB — CBC WITH DIFFERENTIAL (CANCER CENTER ONLY)
Basophils Absolute: 0 10*3/uL (ref 0.0–0.1)
Basophils Relative: 0 %
Eosinophils Absolute: 0.2 10*3/uL (ref 0.0–0.5)
Eosinophils Relative: 3 %
HCT: 34.7 % — ABNORMAL LOW (ref 34.8–46.6)
Hemoglobin: 10.9 g/dL — ABNORMAL LOW (ref 11.6–15.9)
Lymphocytes Relative: 38 %
Lymphs Abs: 1.8 10*3/uL (ref 0.9–3.3)
MCH: 25.6 pg (ref 25.1–34.0)
MCHC: 31.4 g/dL — ABNORMAL LOW (ref 31.5–36.0)
MCV: 81.5 fL (ref 79.5–101.0)
Monocytes Absolute: 0.4 10*3/uL (ref 0.1–0.9)
Monocytes Relative: 7 %
Neutro Abs: 2.5 10*3/uL (ref 1.5–6.5)
Neutrophils Relative %: 52 %
Platelet Count: 325 10*3/uL (ref 145–400)
RBC: 4.26 MIL/uL (ref 3.70–5.45)
RDW: 18.4 % — ABNORMAL HIGH (ref 11.2–14.5)
WBC Count: 4.9 10*3/uL (ref 3.9–10.3)

## 2017-09-26 LAB — PROTIME-INR
INR: 1.79
Prothrombin Time: 20.6 seconds — ABNORMAL HIGH (ref 11.4–15.2)

## 2017-09-26 NOTE — Progress Notes (Signed)
  [place] OFFICE PROGRESS NOTE   Diagnosis: History of deep vein thrombosis  INTERVAL HISTORY:   [name] returns as scheduled.  She continues Coumadin anticoagulation.  Her Coumadin dose is 10 mg Monday Wednesday Friday and 5 mg all other days.  She denies bleeding.  No signs of thrombosis.  She reports undergoing a negative colonoscopy earlier this year.  Objective:  Vital signs in last 24 hours:  Blood pressure (!) 153/71, pulse 90, temperature 98.2 F (36.8 C), temperature source Oral, resp. rate 18, height 5\' 6"  (1.676 m), weight 269 lb 9.6 oz (122.3 kg), SpO2 98 %.   Resp: Lungs clear bilaterally. Cardio: Regular rate and rhythm. GI: Abdomen soft and nontender.  No hepatomegaly. Vascular: No leg edema.    Lab Results:  Lab Results  Component Value Date   WBC 4.9 09/26/2017   HGB 10.9 (L) 09/26/2017   HCT 34.7 (L) 09/26/2017   MCV 81.5 09/26/2017   PLT 325 09/26/2017   NEUTROABS 2.5 09/26/2017  PT 20.6/INR 1.79  Imaging:  No results found.  Medications: I have reviewed the patient's current medications.  Assessment/Plan: 1. History of bilateral lower extremity deep vein thrombosis. She continues Coumadin anticoagulation. 2. History of a positive lupus anticoagulant. 3. History of a positive beta-2-glycoprotein IgA antibody. 4. Chronic bilateral leg pain - negative bilateral Doppler January 2010. 5. History of hypertension. 6. COPD. 7. Status post hysterectomy. 8. Remote history of cocaine abuse. 9. History of an adrenal lesion on a CT scan in 2008 - status post a repeat CT on June 16, 2009, showing slow enlargement of the right adrenal nodule since 2006, most consistent with an adenoma. Stable on a CT 04/24/2014 10. History of rectal and vaginal bleeding in May 2009.  11. Status post carpal tunnel surgery. 12. 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 Right knee replacement September 2011. 13. Multiple orthopedic conditions. 14. Placement  of IVC filter preoperatively before knee replacement. The IVC filter was retrieved 03/01/2011. 15. Anemia. Ferritin in normal range at 82 on 08/18/2013; 61 on 07/10/2016 16. Status post lumbar fusion surgery October 2013 17. Right hydronephrosis-evaluated by Dr. Tresa Moore 18. Left ankle swelling/pain 07/24/2016 19. Mild anemia noted on hospital admission 2018 20. Colonoscopy 04/19/2017-entire examined colon normal.  Repeat colonoscopy in 10 years for surveillance.   Disposition: [name] appears unchanged.  She will continue Coumadin at the current dose and return for a follow-up PT/INR in 1 month.   She continues to have a mild anemia.  She underwent a colonoscopy by Dr. Benson Norway on 04/19/2017 with the entire examined colon noted as normal.    She will return for lab and follow-up in 6 months.  Additional labs will be scheduled pending the PT/INR in 1 month.    Ned Card ANP/GNP-BC   09/26/2017  9:55 AM

## 2017-09-26 NOTE — Telephone Encounter (Signed)
Appointments scheduled AVS/Calendar printed per 7/10 los °

## 2017-10-24 ENCOUNTER — Inpatient Hospital Stay: Payer: Medicare Other

## 2017-10-24 ENCOUNTER — Telehealth: Payer: Self-pay | Admitting: Oncology

## 2017-10-24 NOTE — Telephone Encounter (Signed)
Patient called to reschedule  °

## 2017-10-25 IMAGING — CT CT HEAD W/O CM
2 series · 16 of 30 positions shown, 20 images · non-contrast
Comparison: MRI of the head June 12, 2014

CLINICAL DATA: Headache for 2 weeks, worsening yesterday. History
of hypertension diabetes, lupus, cocaine abuse, fibromyalgia.

EXAM:
CT HEAD WITHOUT CONTRAST
TECHNIQUE: Contiguous axial images were obtained from the base of the skull
through the vertex without intravenous contrast.

[Series 2: head w/o · axial · non-contrast · 0.45mm/px · z∈[+1342,+1467]mm · 13 of 31 slices shown, 17 images]
[im 3/31  brain]
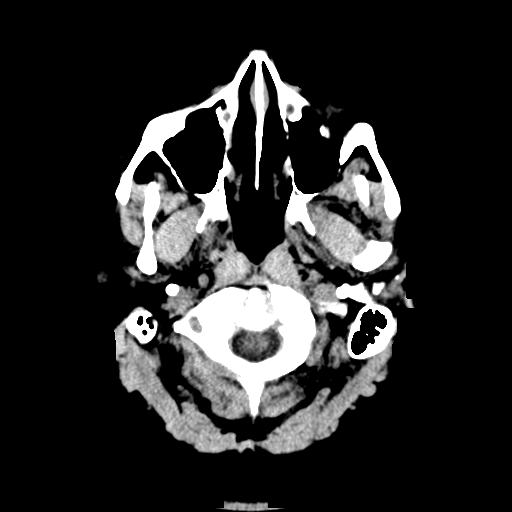
[im 3/31  bone]
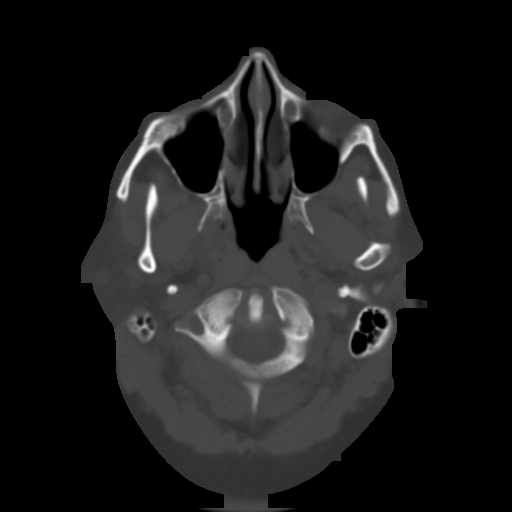
[im 5/31  brain]
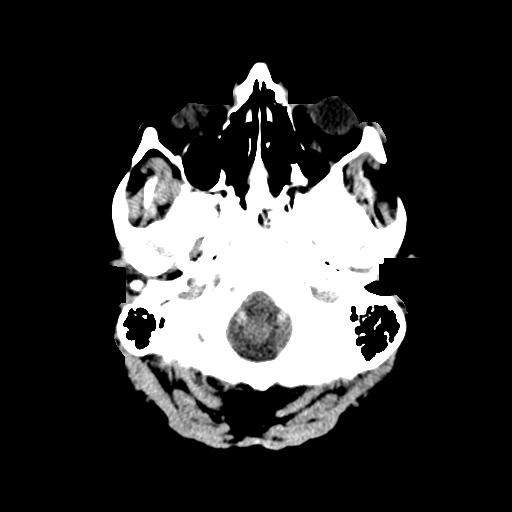
[im 7/31  brain]
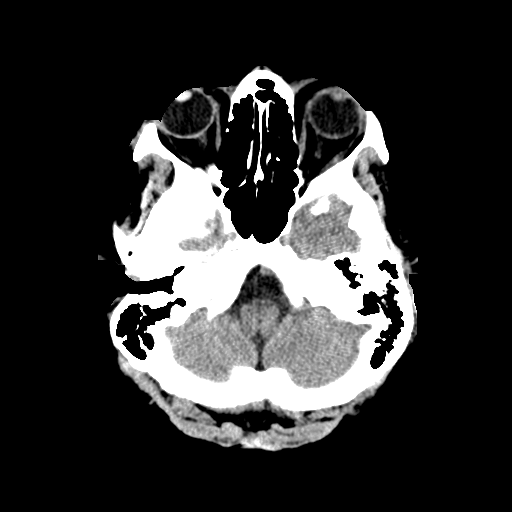
[im 9/31  brain]
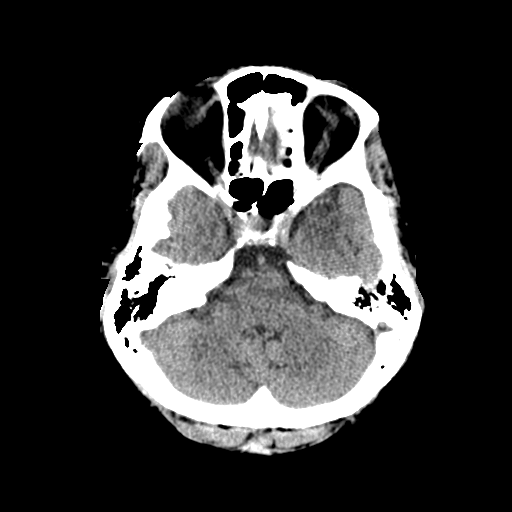
[im 11/31  brain]
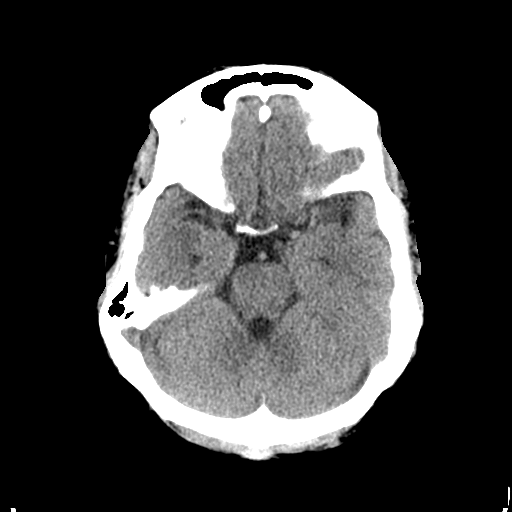
[im 11/31  bone]
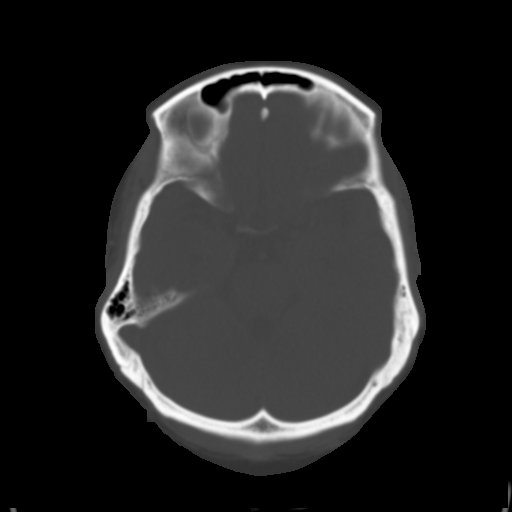
[im 13/31  brain]
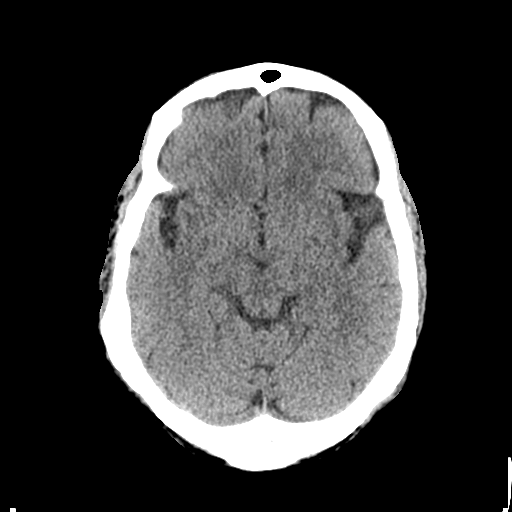
[im 16/31  brain]
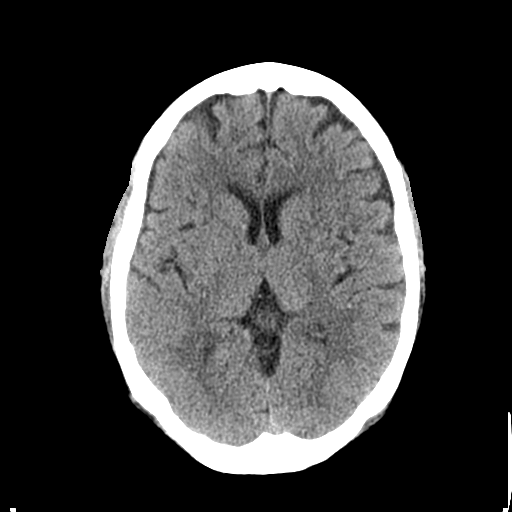
[im 18/31  brain]
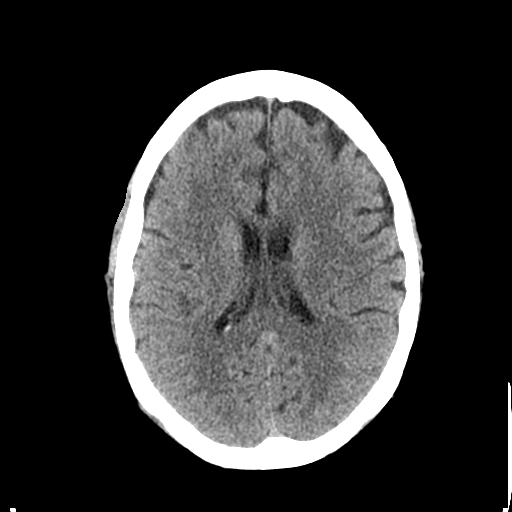
[im 20/31  brain]
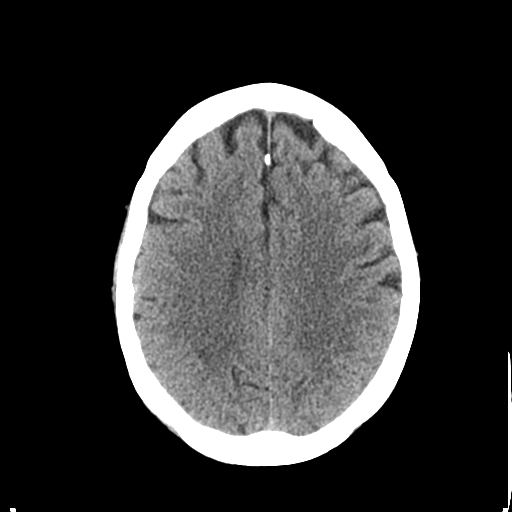
[im 20/31  bone]
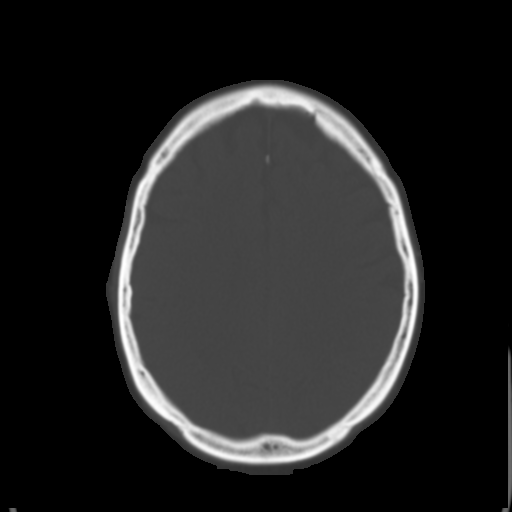
[im 22/31  brain]
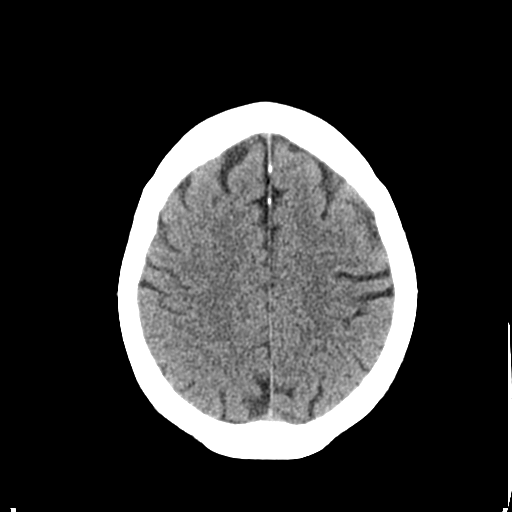
[im 24/31  brain]
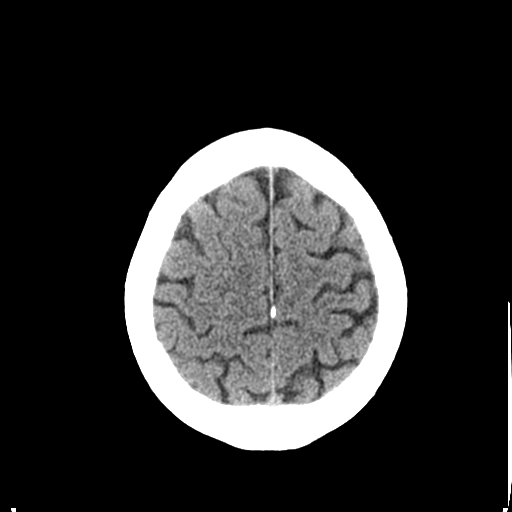
[im 26/31  brain]
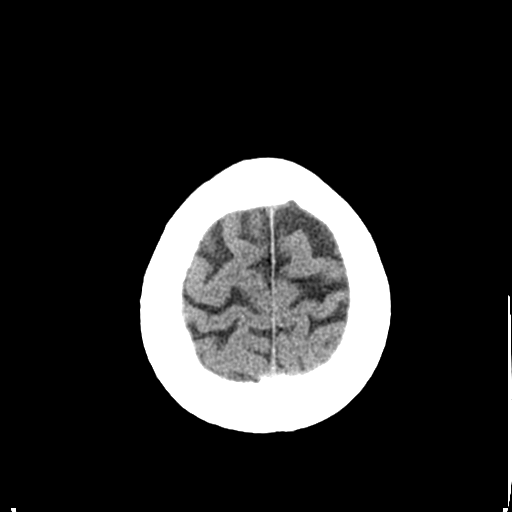
[im 28/31  brain]
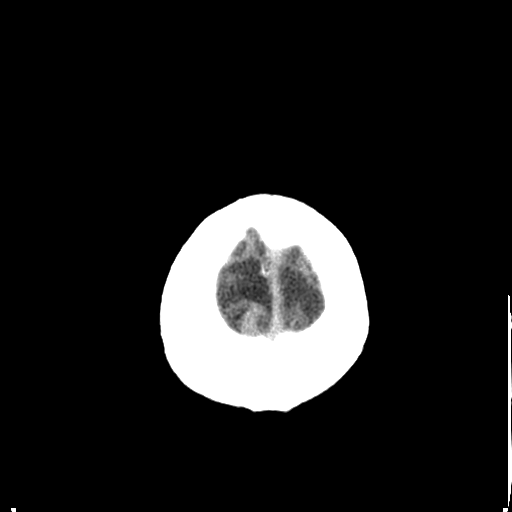
[im 28/31  bone]
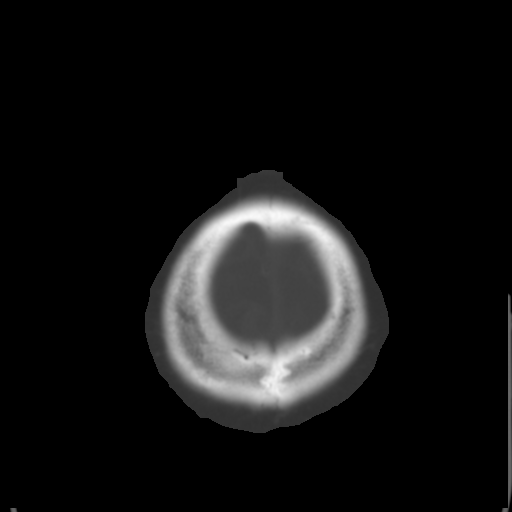

[Series 3: bone windows · axial · 0.45mm/px · z∈[+1342,+1382]mm · 3 of 31 slices shown]
[im 3/31  bone]
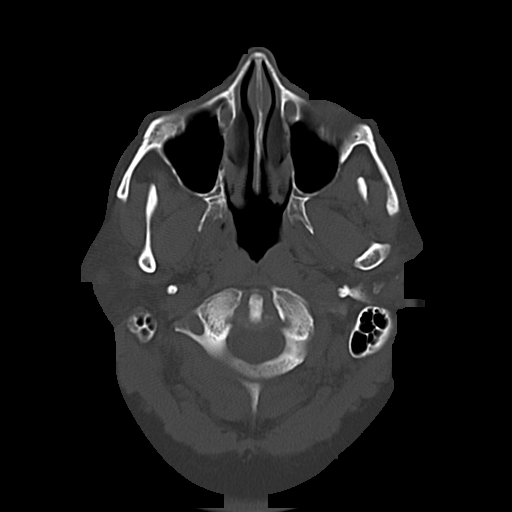
[im 7/31  bone]
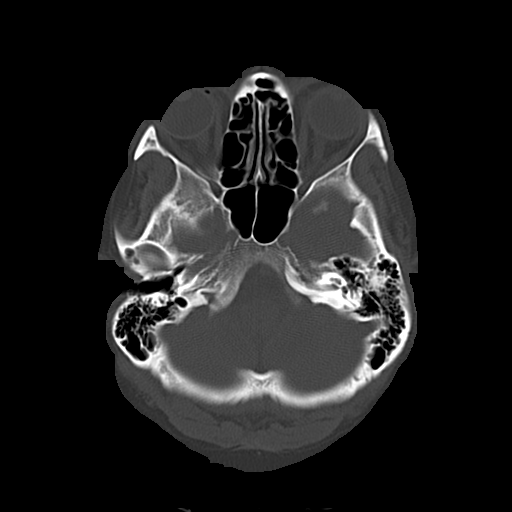
[im 11/31  bone]
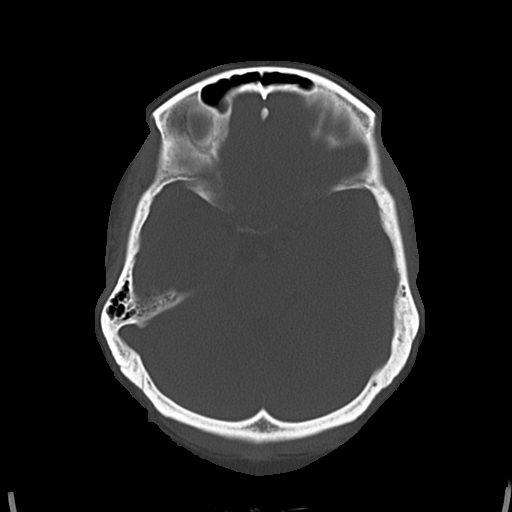

[16 of 30 positions shown; findings below may reference images not displayed]

FINDINGS: The ventricles and sulci are normal. No intraparenchymal hemorrhage,
mass effect nor midline shift. No acute large vascular territory
infarcts. Mild white matter changes.

No abnormal extra-axial fluid collections. Basal cisterns are
patent.

No skull fracture. The included ocular globes and orbital contents
are non-suspicious. The mastoid aircells and included paranasal
sinuses are well-aerated.
IMPRESSION: No acute intracranial process.

Mild chronic small vessel ischemic disease, advanced for age though
similar to prior MRI considering differences in imaging technique.

## 2017-10-29 ENCOUNTER — Inpatient Hospital Stay: Payer: Medicare Other | Attending: Oncology

## 2017-10-29 DIAGNOSIS — Z86718 Personal history of other venous thrombosis and embolism: Secondary | ICD-10-CM | POA: Insufficient documentation

## 2017-10-29 DIAGNOSIS — D6862 Lupus anticoagulant syndrome: Secondary | ICD-10-CM | POA: Diagnosis not present

## 2017-10-29 DIAGNOSIS — Z7901 Long term (current) use of anticoagulants: Secondary | ICD-10-CM

## 2017-10-29 LAB — PROTIME-INR
INR: 2.01
Prothrombin Time: 22.6 seconds — ABNORMAL HIGH (ref 11.4–15.2)

## 2017-10-31 ENCOUNTER — Other Ambulatory Visit: Payer: Self-pay | Admitting: Oncology

## 2017-10-31 ENCOUNTER — Telehealth: Payer: Self-pay

## 2017-10-31 NOTE — Telephone Encounter (Signed)
Returned call to [place] at pharmacy to verify pt current coumadin dose. Called to confirm current instructed dose. Roderic Palau voiced understanding and altered prescription. This RN voiced understanding.

## 2017-11-01 ENCOUNTER — Telehealth: Payer: Self-pay | Admitting: Emergency Medicine

## 2017-11-01 DIAGNOSIS — Z7901 Long term (current) use of anticoagulants: Secondary | ICD-10-CM

## 2017-11-01 NOTE — Telephone Encounter (Addendum)
Pt verbalized understanding. Scheduling message sent for 9/10 @ 9am per pt request.   ----- Message from Owens Shark, NP sent at 10/30/2017  4:25 PM EDT ----- Please instruct her to continue the same dose of Coumadin.  Repeat PT/INR in 1 month.

## 2017-11-16 ENCOUNTER — Ambulatory Visit (INDEPENDENT_AMBULATORY_CARE_PROVIDER_SITE_OTHER): Payer: Medicare Other | Admitting: Podiatry

## 2017-11-16 ENCOUNTER — Encounter: Payer: Self-pay | Admitting: Podiatry

## 2017-11-16 DIAGNOSIS — M79674 Pain in right toe(s): Secondary | ICD-10-CM

## 2017-11-16 DIAGNOSIS — M79675 Pain in left toe(s): Secondary | ICD-10-CM

## 2017-11-16 DIAGNOSIS — B351 Tinea unguium: Secondary | ICD-10-CM

## 2017-11-16 DIAGNOSIS — E1142 Type 2 diabetes mellitus with diabetic polyneuropathy: Secondary | ICD-10-CM

## 2017-11-19 ENCOUNTER — Encounter: Payer: Self-pay | Admitting: Podiatry

## 2017-11-19 NOTE — Progress Notes (Signed)
Subjective: [name] presents today for diabetic foot care. She presents today with cc of painful, discolored, thick toenails which interfere with activities of daily living. Pain is aggravated when wearing enclosed shoe gear. Pain is relieved with periodic professional debridement. She relates right great toe pain when wearing her church shoes on Sundays.   Objective: Vascular Examination: Capillary refill time <3 seconds x 10 digits Dorsalis pedis pulses and Posterior tibial pulses present b/l No digital hair x 10 digits Skin temperature WNL b/l  Dermatological Examination: Turgor, texture and tone normal b/l LE Toenails 1-5 b/l discolored, thick, dystrophic with subungual debris and pain with palpation to nailbeds due to thickness of nails.  Right great toe nailplate thick, dystrophic with subungual debris and keratotic tissue of medial and lateral nail borders. No underlying flocculence nor fluid collection appreciated today. No erythema, no edema, no purulence, no tenderness, no drainage.  Musculoskeletal: Muscle strength 5/5 to all LE muscle groups  Neurological: Sensation intact with 10 gram monofilament. Vibratory sensation intact.  Assessment: 1. Painful onychomycosis toenails 1-5 b/l in patient on blood thinner.  2. NIDDM with neuropathy  Plan: 1. I have instructed [name] to only wear her shoes when she's sitting in church. She is to have her non-dress shoes on in the car and exiting the church.  2. We've discussed toe box height of different types of shoes. I have asked [name] to bring in her church shoes and sneakers for evaluation on her next visit. She related understanding. 3. Toenails 1-5 b/l were debrided in length and girth without iatrogenic bleeding. 4. Patient to continue soft, supportive shoe gear 5. Patient to report any pedal injuries to medical professional immediately. 6. Avoid self trimming due to use of blood thinner. 7. Follow up 3  months. 8. Patient to call should there be a concern in the interim.

## 2017-11-27 ENCOUNTER — Inpatient Hospital Stay: Payer: Medicare Other | Attending: Oncology

## 2017-11-27 DIAGNOSIS — Z86718 Personal history of other venous thrombosis and embolism: Secondary | ICD-10-CM | POA: Insufficient documentation

## 2017-11-27 DIAGNOSIS — Z7901 Long term (current) use of anticoagulants: Secondary | ICD-10-CM | POA: Insufficient documentation

## 2017-11-28 IMAGING — CR DG CHEST 2V
2 series · 2 of 2 positions shown · non-contrast
Comparison: 08/17/2013

CLINICAL DATA: Short of breath and cough

EXAM:
CHEST  2 VIEW

[w chest pa]
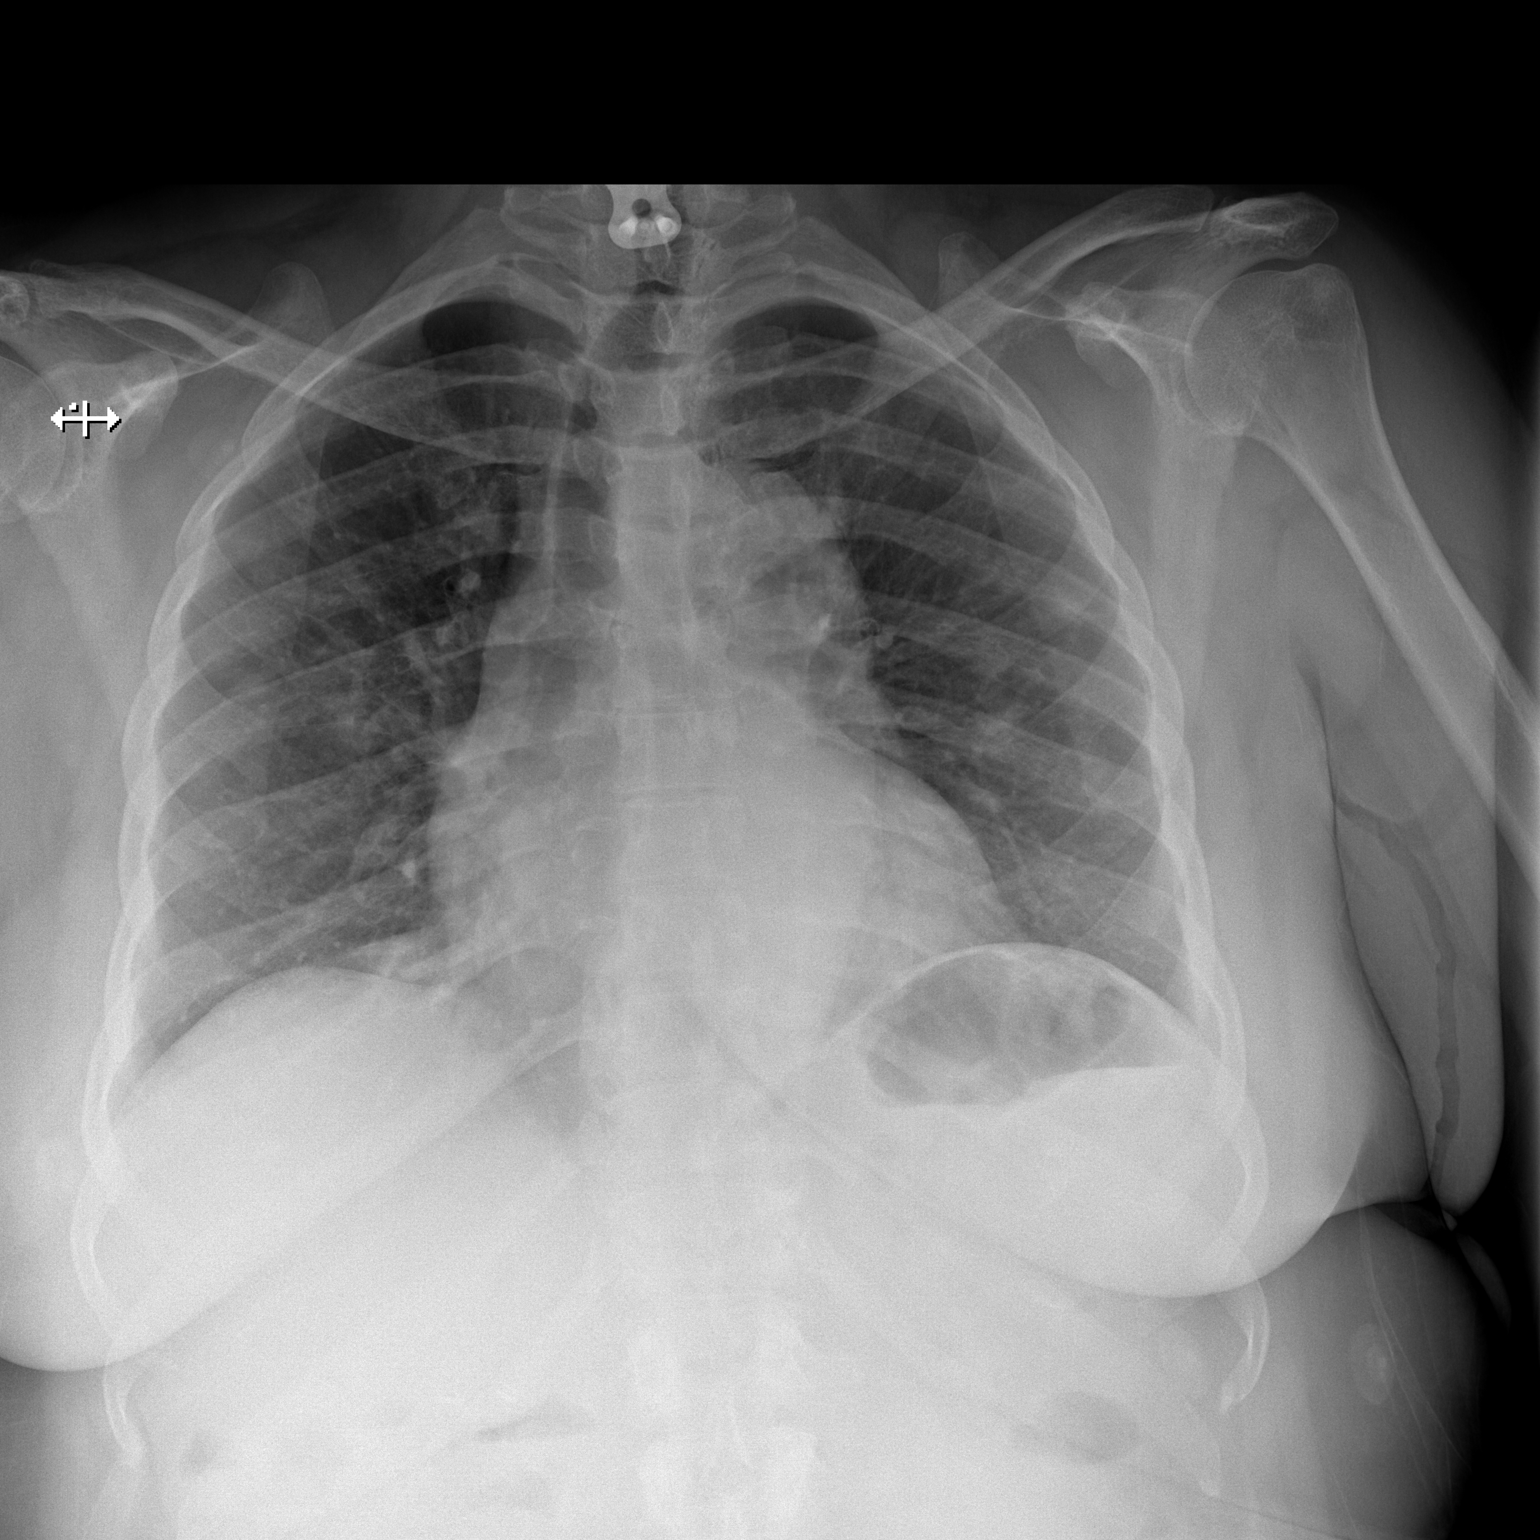

[w chest lat]
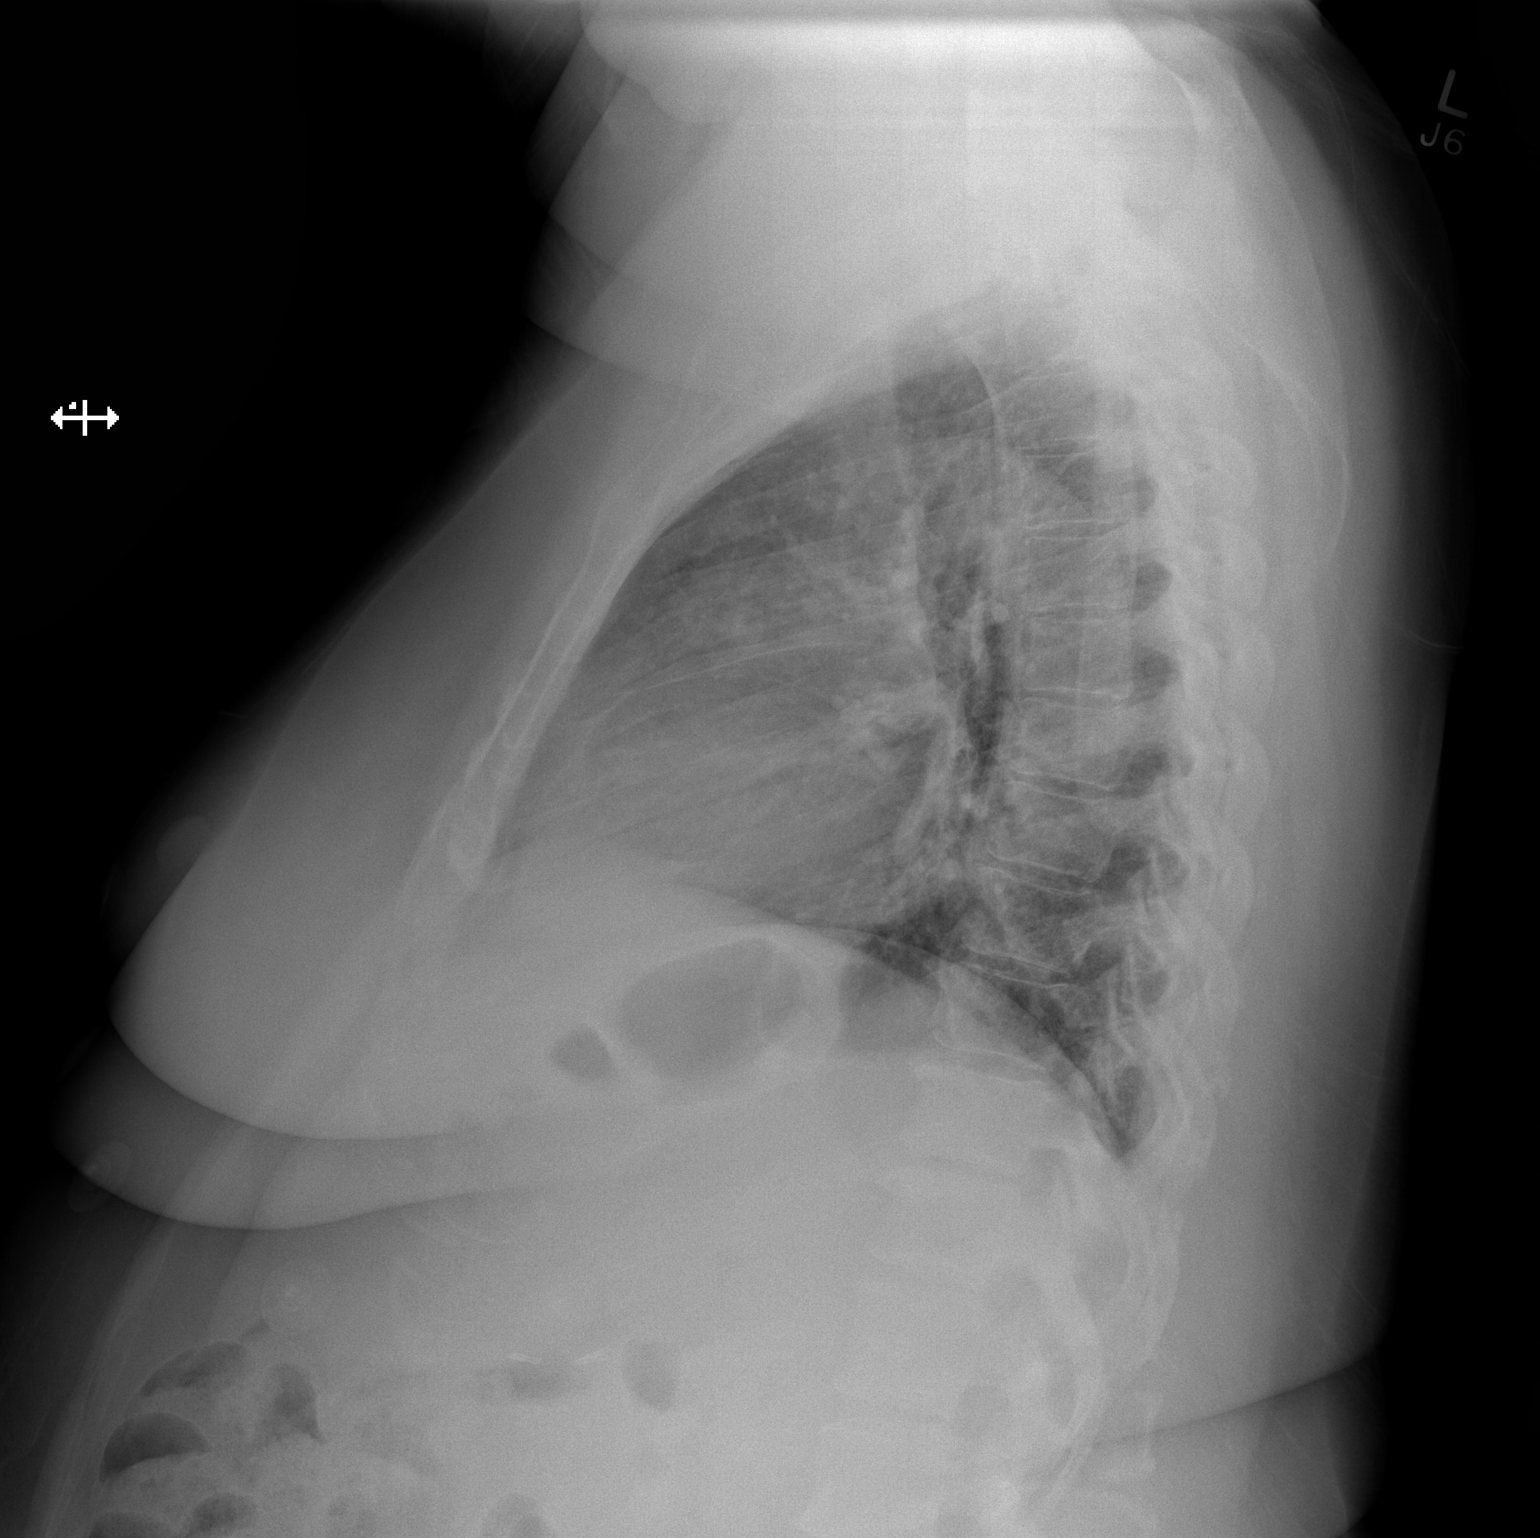

[2 of 2 positions shown; findings below may reference images not displayed]

FINDINGS: Prominent lung markings bilaterally have progressed since prior
baseline studies including 02/02/2012 and 12/26/2011. This may be
due to superimposed atelectasis or pneumonitis. No lobar infiltrate
or effusion. Negative for heart failure or mass.
IMPRESSION: Prominent lung markings has developed since prior baseline studies.
Possible bronchopneumonia.

## 2017-12-03 ENCOUNTER — Telehealth (HOSPITAL_COMMUNITY): Payer: Self-pay | Admitting: Surgery

## 2017-12-03 ENCOUNTER — Telehealth: Payer: Self-pay | Admitting: Nurse Practitioner

## 2017-12-03 NOTE — Telephone Encounter (Signed)
Received an order from [facility] for Carotid ultrasound.  A voicemail was left at [phone] asking the patient to call Rip Harbour at [facility] to arrange an appointment.

## 2017-12-03 NOTE — Telephone Encounter (Signed)
Patient called to reschedule  °

## 2017-12-05 ENCOUNTER — Ambulatory Visit (HOSPITAL_COMMUNITY)
Admission: RE | Admit: 2017-12-05 | Discharge: 2017-12-05 | Disposition: A | Discharge status: Home / Self Care | Payer: Medicare Other | Source: Ambulatory Visit | Attending: Family | Admitting: Family

## 2017-12-05 ENCOUNTER — Other Ambulatory Visit (HOSPITAL_COMMUNITY): Payer: Self-pay | Admitting: Internal Medicine

## 2017-12-05 ENCOUNTER — Telehealth: Payer: Self-pay | Admitting: Emergency Medicine

## 2017-12-05 ENCOUNTER — Inpatient Hospital Stay: Payer: Medicare Other

## 2017-12-05 ENCOUNTER — Other Ambulatory Visit: Payer: Self-pay | Admitting: Internal Medicine

## 2017-12-05 DIAGNOSIS — Z1231 Encounter for screening mammogram for malignant neoplasm of breast: Secondary | ICD-10-CM

## 2017-12-05 DIAGNOSIS — R42 Dizziness and giddiness: Secondary | ICD-10-CM | POA: Diagnosis present

## 2017-12-05 DIAGNOSIS — Z86718 Personal history of other venous thrombosis and embolism: Secondary | ICD-10-CM | POA: Diagnosis present

## 2017-12-05 DIAGNOSIS — Z7901 Long term (current) use of anticoagulants: Secondary | ICD-10-CM

## 2017-12-05 LAB — PROTIME-INR
INR: 2.34
Prothrombin Time: 25.4 seconds — ABNORMAL HIGH (ref 11.4–15.2)

## 2017-12-05 NOTE — Telephone Encounter (Addendum)
Pt verbalized understanding of this. Scheduling message sent for pt to have labs on 10/18 @ 8am. Pt agrees to this appt time.   ----- Message from Owens Shark, NP sent at 12/05/2017  8:38 AM EDT ----- Please let her know the PT/INR looks good.  Continue same dose of Coumadin.  Please schedule repeat PT/INR in 1 month.  Thanks

## 2018-01-03 ENCOUNTER — Other Ambulatory Visit: Payer: Self-pay | Admitting: Emergency Medicine

## 2018-01-03 DIAGNOSIS — Z7901 Long term (current) use of anticoagulants: Secondary | ICD-10-CM

## 2018-01-04 ENCOUNTER — Telehealth: Payer: Self-pay | Admitting: Oncology

## 2018-01-04 ENCOUNTER — Telehealth: Payer: Self-pay | Admitting: *Deleted

## 2018-01-04 ENCOUNTER — Inpatient Hospital Stay: Payer: Medicare Other | Attending: Oncology

## 2018-01-04 DIAGNOSIS — Z7901 Long term (current) use of anticoagulants: Secondary | ICD-10-CM | POA: Insufficient documentation

## 2018-01-04 DIAGNOSIS — Z86718 Personal history of other venous thrombosis and embolism: Secondary | ICD-10-CM | POA: Insufficient documentation

## 2018-01-04 LAB — PROTIME-INR
INR: 1.48
Prothrombin Time: 17.7 seconds — ABNORMAL HIGH (ref 11.4–15.2)

## 2018-01-04 NOTE — Telephone Encounter (Signed)
Appt scheduled patient notified per 10/18 sch msg °

## 2018-01-04 NOTE — Telephone Encounter (Signed)
-----   Message from Owens Shark, NP sent at 01/04/2018  9:43 AM EDT ----- Has she missed any doses of Coumadin?  Please verify her current dose.  Repeat PT/INR in 2 weeks.

## 2018-01-04 NOTE — Telephone Encounter (Signed)
10mg  MWF and 5mg  all other days as indicated in previous messages. Left message for return call to confirm. Scheduling message sent for labs in 2 weeks. Labs have been ordered.

## 2018-01-08 ENCOUNTER — Ambulatory Visit
Admission: RE | Admit: 2018-01-08 | Discharge: 2018-01-08 | Disposition: A | Discharge status: Home / Self Care | Payer: Medicare Other | Source: Ambulatory Visit | Attending: Internal Medicine | Admitting: Internal Medicine

## 2018-01-08 DIAGNOSIS — Z1231 Encounter for screening mammogram for malignant neoplasm of breast: Secondary | ICD-10-CM

## 2018-01-18 ENCOUNTER — Inpatient Hospital Stay: Payer: Medicare Other | Attending: Oncology

## 2018-01-18 DIAGNOSIS — Z7901 Long term (current) use of anticoagulants: Secondary | ICD-10-CM | POA: Diagnosis not present

## 2018-01-18 DIAGNOSIS — Z86718 Personal history of other venous thrombosis and embolism: Secondary | ICD-10-CM | POA: Diagnosis present

## 2018-01-18 LAB — PROTIME-INR
INR: 1.32
Prothrombin Time: 16.2 seconds — ABNORMAL HIGH (ref 11.4–15.2)

## 2018-01-21 ENCOUNTER — Telehealth: Payer: Self-pay | Admitting: *Deleted

## 2018-01-21 DIAGNOSIS — Z7901 Long term (current) use of anticoagulants: Secondary | ICD-10-CM

## 2018-01-21 NOTE — Telephone Encounter (Signed)
Notified patient of low INR. She confirms she is taking Coumadin 10 mg on MWF and 5 mg all other days. She has started taking organic vinegar, 1 capful twice daily.  Per Dr. Benay Spice: Stop vinegar and recheck INR in 2 weeks. Continue dosing as above. She understands and agrees. Message to scheduler.

## 2018-01-22 ENCOUNTER — Telehealth: Payer: Self-pay | Admitting: Oncology

## 2018-01-22 NOTE — Telephone Encounter (Signed)
Scheduled appt per 11/4 sch message - pt is aware of appt date and time   

## 2018-01-26 ENCOUNTER — Other Ambulatory Visit: Payer: Self-pay | Admitting: Oncology

## 2018-02-01 ENCOUNTER — Other Ambulatory Visit: Payer: Self-pay | Admitting: Oncology

## 2018-02-04 ENCOUNTER — Other Ambulatory Visit: Payer: Self-pay | Admitting: Oncology

## 2018-02-04 ENCOUNTER — Other Ambulatory Visit: Payer: Self-pay | Admitting: *Deleted

## 2018-02-04 ENCOUNTER — Inpatient Hospital Stay: Payer: Medicare Other

## 2018-02-04 DIAGNOSIS — Z7901 Long term (current) use of anticoagulants: Secondary | ICD-10-CM

## 2018-02-04 DIAGNOSIS — Z86718 Personal history of other venous thrombosis and embolism: Secondary | ICD-10-CM | POA: Diagnosis not present

## 2018-02-04 LAB — PROTIME-INR
INR: 2.16
Prothrombin Time: 23.8 seconds — ABNORMAL HIGH (ref 11.4–15.2)

## 2018-02-05 ENCOUNTER — Telehealth: Payer: Self-pay | Admitting: *Deleted

## 2018-02-05 DIAGNOSIS — I82403 Acute embolism and thrombosis of unspecified deep veins of lower extremity, bilateral: Secondary | ICD-10-CM

## 2018-02-05 DIAGNOSIS — Z7901 Long term (current) use of anticoagulants: Secondary | ICD-10-CM

## 2018-02-05 NOTE — Telephone Encounter (Signed)
Patient informed of INR results. Continue same Coumadin per Dr. Gerilyn Pilgrim  Coumadin 5 mg daily, except 10 mg on MWF. Re check in 4 weeks.

## 2018-02-06 ENCOUNTER — Telehealth: Payer: Self-pay | Admitting: Oncology

## 2018-02-06 NOTE — Telephone Encounter (Signed)
Scheduled appt per 11/19 sch message - pt is aware of appt date and time  

## 2018-02-08 ENCOUNTER — Ambulatory Visit: Payer: Medicare Other | Admitting: Podiatry

## 2018-02-11 ENCOUNTER — Ambulatory Visit: Payer: Medicare Other | Admitting: Family Medicine

## 2018-03-04 ENCOUNTER — Other Ambulatory Visit: Payer: Self-pay

## 2018-03-04 ENCOUNTER — Emergency Department (HOSPITAL_COMMUNITY)
Admission: EM | Admit: 2018-03-04 | Discharge: 2018-03-04 | Disposition: A | Discharge status: Home / Self Care | Payer: Medicare Other | Attending: Emergency Medicine | Admitting: Emergency Medicine

## 2018-03-04 ENCOUNTER — Encounter (HOSPITAL_COMMUNITY): Payer: Self-pay | Admitting: Emergency Medicine

## 2018-03-04 ENCOUNTER — Emergency Department (HOSPITAL_COMMUNITY): Payer: Medicare Other

## 2018-03-04 DIAGNOSIS — E119 Type 2 diabetes mellitus without complications: Secondary | ICD-10-CM | POA: Insufficient documentation

## 2018-03-04 DIAGNOSIS — R51 Headache: Secondary | ICD-10-CM | POA: Diagnosis present

## 2018-03-04 DIAGNOSIS — E039 Hypothyroidism, unspecified: Secondary | ICD-10-CM | POA: Diagnosis not present

## 2018-03-04 DIAGNOSIS — Z79899 Other long term (current) drug therapy: Secondary | ICD-10-CM | POA: Diagnosis not present

## 2018-03-04 DIAGNOSIS — J449 Chronic obstructive pulmonary disease, unspecified: Secondary | ICD-10-CM | POA: Diagnosis not present

## 2018-03-04 DIAGNOSIS — I10 Essential (primary) hypertension: Secondary | ICD-10-CM | POA: Diagnosis not present

## 2018-03-04 DIAGNOSIS — Z7984 Long term (current) use of oral hypoglycemic drugs: Secondary | ICD-10-CM | POA: Diagnosis not present

## 2018-03-04 DIAGNOSIS — Z7901 Long term (current) use of anticoagulants: Secondary | ICD-10-CM | POA: Diagnosis not present

## 2018-03-04 DIAGNOSIS — Z87891 Personal history of nicotine dependence: Secondary | ICD-10-CM | POA: Insufficient documentation

## 2018-03-04 DIAGNOSIS — R519 Headache, unspecified: Secondary | ICD-10-CM

## 2018-03-04 LAB — CBC
HCT: 32.9 % — ABNORMAL LOW (ref 36.0–46.0)
Hemoglobin: 10.2 g/dL — ABNORMAL LOW (ref 12.0–15.0)
MCH: 26 pg (ref 26.0–34.0)
MCHC: 31 g/dL (ref 30.0–36.0)
MCV: 83.7 fL (ref 80.0–100.0)
Platelets: 354 10*3/uL (ref 150–400)
RBC: 3.93 MIL/uL (ref 3.87–5.11)
RDW: 18 % — ABNORMAL HIGH (ref 11.5–15.5)
WBC: 7.8 10*3/uL (ref 4.0–10.5)
nRBC: 0 % (ref 0.0–0.2)

## 2018-03-04 LAB — BASIC METABOLIC PANEL
Anion gap: 10 (ref 5–15)
BUN: 10 mg/dL (ref 8–23)
CO2: 30 mmol/L (ref 22–32)
Calcium: 8 mg/dL — ABNORMAL LOW (ref 8.9–10.3)
Chloride: 101 mmol/L (ref 98–111)
Creatinine, Ser: 0.62 mg/dL (ref 0.44–1.00)
GFR calc Af Amer: >60 mL/min (ref >60)
GFR calc non Af Amer: >60 mL/min (ref >60)
Glucose, Bld: 115 mg/dL — ABNORMAL HIGH (ref 70–99)
Potassium: 3.2 mmol/L — ABNORMAL LOW (ref 3.5–5.1)
Sodium: 141 mmol/L (ref 135–145)

## 2018-03-04 MED ORDER — DIPHENHYDRAMINE HCL 50 MG/ML IJ SOLN
25.0000 mg | Freq: Once | INTRAMUSCULAR | Status: AC
  Administered 2018-03-04: 25 mg via INTRAVENOUS
  Filled 2018-03-04: qty 1

## 2018-03-04 MED ORDER — SODIUM CHLORIDE 0.9 % IV SOLN
INTRAVENOUS | Status: DC
  Administered 2018-03-04: 15:00:00 via INTRAVENOUS

## 2018-03-04 MED ORDER — DEXAMETHASONE SODIUM PHOSPHATE 10 MG/ML IJ SOLN
10.0000 mg | Freq: Once | INTRAMUSCULAR | Status: AC
  Administered 2018-03-04: 10 mg via INTRAVENOUS
  Filled 2018-03-04: qty 1

## 2018-03-04 MED ORDER — SODIUM CHLORIDE 0.9 % IV BOLUS
500.0000 mL | Freq: Once | INTRAVENOUS | Status: AC
  Administered 2018-03-04: 500 mL via INTRAVENOUS

## 2018-03-04 MED ORDER — METOCLOPRAMIDE HCL 5 MG/ML IJ SOLN
10.0000 mg | Freq: Once | INTRAMUSCULAR | Status: AC
  Administered 2018-03-04: 10 mg via INTRAVENOUS
  Filled 2018-03-04: qty 2

## 2018-03-04 NOTE — ED Provider Notes (Addendum)
[facility] DEPT Provider Note   CSN: [id number] Arrival date & time: 03/04/18  1021     History   Chief Complaint Chief Complaint  Patient presents with  . Headache    HPI [name] is a 62 y.o. female.  Patient brought in by EMS with a complaint of headache.  Since Friday.  She has had a bilateral frontal headache that is been pretty severe.  Not improving with Advil.  Patient currently being followed by neurology more for vertigo.  Does have follow-up later this week.  About a month ago patient had MRI without any acute findings.  Patient does not normally have headaches like this.  No history of migraines.  Patient's blood pressure on presentation was elevated at 174/93.  Patient is on Coumadin.  Patient without any bleeding problems that were known.  Patient has diabetes.  Patient is on clonidine.  Patient is on Norvasc.  Patient is also on Neurontin.  Patient's on hydrochlorothiazide 2.  Patient has no history of hypertension.     Past Medical History:  Diagnosis Date  . Adrenal mass ([place]) 03/226   Benign  . Arthritis    knees/multiple orthopedic conditons; lower back  . Asthma    per pt  . 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 Depression   . Diabetes mellitus    120s usually fasting -  dx more than 10 yrs ago  . Dizziness, nonspecific   . DVT (deep venous thrombosis) (HCC)    Recurrent  . Fibromyalgia   . GERD (gastroesophageal reflux disease)   . Heart murmur   . History of blood transfusion   . History of cocaine abuse ([place])    Remote history   . Hypertension    takes meds daily  . Hypothyroidism   . Lupus Anticoagulant Positive   . On home oxygen therapy    at night  . Shortness of breath    exertion or lying flat  . Sleep apnea    2l of oxygen at night (as of 12/6, she used to)    Patient Active  Problem List   Diagnosis Date Noted  . Thyroid mass 02/27/2017  . Chronic pain of right knee 05/24/2016  . Effusion, right knee 04/28/2016  . Presence of right artificial knee joint 04/28/2016  . Abnormal LFTs   . Decreased sensation   . Paresthesia 06/12/2014  . Obstruction of kidney 04/24/2014  . Diabetes type 2, uncontrolled ([place]) 04/24/2014  . Chronic anticoagulation 04/24/2014  . Crack cocaine use 04/24/2014  . Depression 04/24/2014  . Fibromyalgia 04/24/2014  . COPD (chronic obstructive pulmonary disease) ([place]) 04/24/2014  . Obstructive sleep apnea on CPAP 04/24/2014  . Lupus anticoagulant positive 04/24/2014  . Adrenal mass, right ([place]) 04/24/2014  . Right kidney mass 04/24/2014  . Hydronephrosis of right kidney 04/24/2014  . Supratherapeutic INR 04/24/2014  . Renal mass, right 04/24/2014  . Diabetes type 2, controlled ([place])   . Right lower quadrant abdominal pain   . Morbid obesity ([place]) 08/21/2013  . Unspecified constipation 08/19/2013  . COPD exacerbation ([place]) 08/17/2013  . Morbid obesity with BMI of 45.0-49.9, adult ([place]) 01/05/2012  . Bilateral deep vein thromboses ([place]) 12/13/2011  . Abdominal pain 11/05/2011  . Hypokalemia 11/05/2011  . Sciatica 11/05/2011  . Type II or unspecified type diabetes mellitus without mention of complication, not stated as  uncontrolled 11/05/2011  . HTN (hypertension) 11/05/2011    Past Surgical History:  Procedure Laterality Date  . ABDOMINAL HYSTERECTOMY  1989   Fibroids  . ANTERIOR LUMBAR FUSION  01/03/2012   Procedure: ANTERIOR LUMBAR FUSION 1 LEVEL;  Surgeon: Winfield Cunas, MD;  Location: [facility];  Service: Neurosurgery;  Laterality: N/A;  Lumbar Four-Five Anterior Lumbar Interbody Fusion with Instrumentation  . BACK SURGERY     cervical spine---disk disease  . CARPAL TUNNEL RELEASE     Bilateral  . CESAREAN SECTION    . CHOLECYSTECTOMY    . CYSTOSCOPY/RETROGRADE/URETEROSCOPY/STONE EXTRACTION WITH BASKET Right  04/26/2014   Procedure: CYSTOSCOPY/ RIGHT RETROGRADE/ RIGHT URETEROSCOPY/URETERAL AND RENAL PELVIS BIOPSY;  Surgeon: Alexis Frock, MD;  Location: [facility];  Service: Urology;  Laterality: Right;  . gallstones removed    . 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   bilateral  . right elbow surgery    . THYROID LOBECTOMY Right 02/27/2017  . THYROID LOBECTOMY Right 02/27/2017   Procedure: RIGHT THYROID LOBECTOMY;  Surgeon: Erroll Luna, MD;  Location: [facility];  Service: General;  Laterality: Right;  . TUBAL LIGATION       OB History   No obstetric history on file.      Home Medications    Prior to Admission medications   Medication Sig Start Date End Date Taking? Authorizing Provider  albuterol (PROVENTIL HFA;VENTOLIN HFA) 108 (90 BASE) MCG/ACT inhaler Inhale 2 puffs into the lungs every 4 (four) hours as needed for shortness of breath.   Yes [provider]  albuterol (PROVENTIL) (2.5 MG/3ML) 0.083% nebulizer solution Take 2.5 mg by nebulization every 6 (six) hours as needed for wheezing or shortness of breath.   Yes [provider]  allopurinol (ZYLOPRIM) 100 MG tablet Take 100mg  by mouth daily 07/03/16  Yes [provider]  amLODipine (NORVASC) 5 MG tablet Take 5 mg by mouth daily.   Yes [provider]  canagliflozin (INVOKANA) 100 MG TABS tablet Take 100 mg by mouth daily.   Yes [provider]  CARTIA XT 240 MG 24 hr capsule Take 240 mg by mouth daily. 06/17/14  Yes [provider]  cloNIDine (CATAPRES) 0.1 MG tablet Take 0.1 mg by mouth 2 (two) times daily.   Yes [provider]  diclofenac sodium (VOLTAREN) 1 % GEL Apply 2 g topically daily as needed (fibromyalgia pain).   Yes [provider]  DULoxetine (CYMBALTA) 20 MG capsule Take 20 mg by mouth daily. 01/11/18  Yes [provider]  DULoxetine (CYMBALTA) 60 MG capsule Take 60 mg by mouth at bedtime.    Yes [provider]  gabapentin (NEURONTIN)  300 MG capsule Take 300 mg by mouth at bedtime.    Yes [provider]  hydrochlorothiazide (HYDRODIURIL) 12.5 MG tablet Take 12.5 mg by mouth daily.  07/02/17  Yes [provider]  LINZESS 145 MCG CAPS capsule Take 145 mcg by mouth daily before breakfast.  08/08/17  Yes [provider]  metFORMIN (GLUCOPHAGE) 500 MG tablet Take 500 mg by mouth at bedtime.   Yes [provider]  omeprazole (PRILOSEC) 20 MG capsule Take 20 mg by mouth 2 (two) times daily.   Yes [provider]  QUEtiapine (SEROQUEL) 50 MG tablet Take 50 mg by mouth at bedtime.    Yes [provider]  simvastatin (ZOCOR) 40 MG tablet Take 40 mg by mouth daily. 02/07/18  Yes [provider]  tiZANidine (ZANAFLEX) 4 MG tablet Take 4  mg by mouth 2 (two) times daily as needed for muscle spasms. 01/29/18  Yes [provider]  traMADol (ULTRAM) 50 MG tablet TAKE 1-2 TABLETS BY MOUTH EVERY DAY AS NEEDED FOR PAIN Patient taking differently: TAKE 50-100mg  BY MOUTH EVERY DAY AS NEEDED FOR PAIN 09/04/16  Yes Meredith Pel, MD  warfarin (COUMADIN) 10 MG tablet TAKE 1 TABLET BY MOUTH EVERY [place] AND THURSDAY Patient taking differently: Take 10 mg by mouth every Monday, Wednesday, Friday 01/28/18  Yes Ladell Pier, MD  warfarin (COUMADIN) 5 MG tablet Take 5 mg daily (except on MWF = 10 mg tablet) or as directed Patient taking differently: Take 5 mg by mouth on Tues, Thurs, Sat, and Sun 02/04/18  Yes Ladell Pier, MD  magnesium oxide (MAG-OX) 400 (241.3 Mg) MG tablet Take 1 tablet (400 mg total) by mouth daily. Patient not taking: Reported on 03/04/2018 02/16/17   Ezequiel Essex, MD  meclizine (ANTIVERT) 25 MG tablet Take 1 tablet (25 mg total) by mouth 3 (three) times daily as needed for dizziness. Patient not taking: Reported on 03/04/2018 09/29/16   Charlesetta Shanks, MD  metoCLOPramide (REGLAN) 10 MG tablet Take 1 tablet (10 mg total) by mouth every 6 (six) hours  as needed for nausea (or headache). Patient not taking: Reported on 16/12/9602 5/40/98   Delora Fuel, MD  oxyCODONE (OXY IR/ROXICODONE) 5 MG immediate release tablet Take 1-2 tablets (5-10 mg total) by mouth every 4 (four) hours as needed for severe pain. Patient not taking: Reported on 03/04/2018 02/28/17   Erroll Luna, MD  potassium chloride SA (K-DUR,KLOR-CON) 20 MEQ tablet Take 1 tablet (20 mEq total) by mouth 2 (two) times daily. Patient not taking: Reported on 03/04/2018 02/16/17   Ezequiel Essex, MD    Family History Family History  Problem Relation Age of Onset  . Hypertension Mother   . Diabetes Mother   . Cancer Mother        Pancreatic  . Hypertension Father   . Heart failure Father     Social History Social History   Tobacco Use  . Smoking status: Former Smoker    Start date: 03/20/2001    Last attempt to quit: 03/21/2003    Years since quitting: 14.9  . Smokeless tobacco: Never Used  . Tobacco comment: smoked for 2 years  Substance Use Topics  . Alcohol use: No    Alcohol/week: 0.0 standard drinks    Comment: beer in past   . Drug use: Yes    Types: Cocaine    Comment: quit 2003-rehab progam in 2005     Allergies   Lisinopril and Corticosteroids   Review of Systems Review of Systems  Constitutional: Negative for fever.  HENT: Negative for congestion.   Eyes: Negative for visual disturbance.  Cardiovascular: Negative for chest pain.  Gastrointestinal: Negative for abdominal pain, nausea and vomiting.  Genitourinary: Negative for dysuria and hematuria.  Musculoskeletal: Negative for back pain, neck pain and neck stiffness.  Neurological: Positive for headaches. Negative for speech difficulty, weakness and numbness.  Hematological: Bruises/bleeds easily.  Psychiatric/Behavioral: Negative for confusion.     Physical Exam Updated Vital Signs BP (!) 159/110   Pulse 83   Temp 98.3 F (36.8 C) (Oral)   Resp 18   SpO2 99%   Physical  Exam Vitals signs and nursing note reviewed.  Constitutional:      General: She is not in acute distress.    Appearance: Normal appearance. She is not toxic-appearing.  HENT:  Head: Normocephalic and atraumatic.     Mouth/Throat:     Mouth: Mucous membranes are moist.  Eyes:     Extraocular Movements: Extraocular movements intact.     Conjunctiva/sclera: Conjunctivae normal.     Pupils: Pupils are equal, round, and reactive to light.  Neck:     Musculoskeletal: Normal range of motion and neck supple.  Cardiovascular:     Rate and Rhythm: Normal rate and regular rhythm.  Pulmonary:     Effort: Pulmonary effort is normal. No respiratory distress.     Breath sounds: Normal breath sounds.  Abdominal:     General: Bowel sounds are normal.     Palpations: Abdomen is soft.     Tenderness: There is no abdominal tenderness.  Musculoskeletal: Normal range of motion.  Skin:    General: Skin is warm.     Capillary Refill: Capillary refill takes less than 2 seconds.  Neurological:     General: No focal deficit present.     Mental Status: She is alert and oriented to person, place, and time.     Cranial Nerves: No cranial nerve deficit.     Sensory: No sensory deficit.     Motor: No weakness.      ED Treatments / Results  Labs (all labs ordered are listed, but only abnormal results are displayed) Labs Reviewed  CBC - Abnormal; Notable for the following components:      Result Value   Hemoglobin 10.2 (*)    HCT 32.9 (*)    RDW 18.0 (*)    All other components within normal limits  BASIC METABOLIC PANEL - Abnormal; Notable for the following components:   Potassium 3.2 (*)    Glucose, Bld 115 (*)    Calcium 8.0 (*)    All other components within normal limits    EKG None  Radiology Ct Head Wo Contrast  Result Date: 03/04/2018 CLINICAL DATA:  62 y/o F; episode of headache with 4 episodes of vomiting. EXAM: CT HEAD WITHOUT CONTRAST TECHNIQUE: Contiguous axial images were  obtained from the base of the skull through the vertex without intravenous contrast. COMPARISON:  02/16/2017 CT head. FINDINGS: Brain: No evidence of acute infarction, hemorrhage, hydrocephalus, extra-axial collection or mass lesion/mass effect. Stable nonspecific white matter hypodensities compatible with mild chronic microvascular ischemic changes. Stable mild volume loss of the brain Vascular: No hyperdense vessel or unexpected calcification. Skull: Normal. Negative for fracture or focal lesion. Sinuses/Orbits: No acute finding. Other: None. IMPRESSION: 1. No acute intracranial abnormality identified. 2. Stable mild chronic microvascular ischemic changes and mild volume loss of the brain. Electronically Signed   By: Kristine Garbe M.D.   On: 03/04/2018 16:40    Procedures Procedures (including critical care time)  Medications Ordered in ED Medications  0.9 %  sodium chloride infusion ( Intravenous New Bag/Given 03/04/18 1512)  sodium chloride 0.9 % bolus 500 mL (500 mLs Intravenous New Bag/Given 03/04/18 1454)  dexamethasone (DECADRON) injection 10 mg (10 mg Intravenous Given 03/04/18 1506)  metoCLOPramide (REGLAN) injection 10 mg (10 mg Intravenous Given 03/04/18 1507)  diphenhydrAMINE (BENADRYL) injection 25 mg (25 mg Intravenous Given 03/04/18 1507)     Initial Impression / Assessment and Plan / ED Course  I have reviewed the triage vital signs and the nursing notes.  Pertinent labs & imaging results that were available during my care of the patient were reviewed by me and considered in my medical decision making (see chart for details).     Patient  with headache frontal fairly significant since Friday.  Patient normally does not get headaches.  Patient treated here with migraine cocktail of Reglan Decadron and Benadryl.  Patient has a history of supposed allergies to steroids but she is tolerated tolerated the Decadron fine.  Headache is improving.  She also received IV  fluids.  Due to the fact that patient is on Coumadin and has the headache will get CT head just to rule out bleed.  If negative patient can follow-up later this week with her neurologist.  Patient being followed by neurology more for vertigo kind of symptoms did have an MRI of the brain about a month ago without any acute findings.  Did not order INR however if it does show evidence of bleed on the brain then patient will need to have that checked.  Patient has not had any bleeding symptoms.  CT scan of the head negative.  Final Clinical Impressions(s) / ED Diagnoses   Final diagnoses:  Headache disorder  Essential hypertension    ED Discharge Orders    None       Fredia Sorrow, MD 03/04/18 1625    Fredia Sorrow, MD 03/04/18 [phone]

## 2018-03-04 NOTE — ED Notes (Signed)
Pt reports pain on top of her head Friday after laughing so hard.  Pain was better after she stopped laughing.  Vomited x 4 yesterday, none today.  She describes the pain as "like having a brain freeze" and throbbing pain.  No hx of migraine h/a.  Denies photosensitivity but states dim lights make her head feel better.  She is hypertensive today, she states she takes HTN meds BID, last dose was last night, she did not take her am dose today because she is in the ED.  She is A&Ox 4, in NAD.  Denies unilateral weakness, facial droop, or slurred speech.

## 2018-03-04 NOTE — ED Triage Notes (Addendum)
Pt arrives via EMS from home. Pt reports headache since Friday that has gotten progressively worse. Pt is A&O. Pt denies any other symptoms.

## 2018-03-04 NOTE — Discharge Instructions (Signed)
Go home and rest.  Follow-up with neurology as scheduled for later this week.  Return for any new or worse symptoms.

## 2018-03-04 NOTE — ED Notes (Signed)
Bed: WA02 Expected date:  Expected time:  Means of arrival:  Comments: EMS 62 yo

## 2018-03-08 ENCOUNTER — Telehealth: Payer: Self-pay | Admitting: Nurse Practitioner

## 2018-03-08 ENCOUNTER — Inpatient Hospital Stay: Payer: Medicare Other | Attending: Oncology

## 2018-03-08 NOTE — Telephone Encounter (Signed)
Called pt to r/s - left message for patient to call back to r/s

## 2018-03-28 ENCOUNTER — Inpatient Hospital Stay: Payer: Medicare Other | Admitting: Nurse Practitioner

## 2018-03-28 ENCOUNTER — Telehealth: Payer: Self-pay | Admitting: *Deleted

## 2018-03-28 ENCOUNTER — Inpatient Hospital Stay: Payer: Medicare Other | Attending: Oncology

## 2018-03-28 DIAGNOSIS — Z86718 Personal history of other venous thrombosis and embolism: Secondary | ICD-10-CM | POA: Insufficient documentation

## 2018-03-28 DIAGNOSIS — Z7901 Long term (current) use of anticoagulants: Secondary | ICD-10-CM | POA: Insufficient documentation

## 2018-03-28 NOTE — Telephone Encounter (Signed)
Called to f/u on FTKA today and patient reports she had called and left a message that she needed to reschedule. Scheduled for lab tomorrow and she will see MD with lab in 1 month. Scheduling message sent.

## 2018-03-29 ENCOUNTER — Telehealth: Payer: Self-pay | Admitting: Hematology

## 2018-03-29 ENCOUNTER — Telehealth: Payer: Self-pay

## 2018-03-29 ENCOUNTER — Inpatient Hospital Stay: Payer: Medicare Other

## 2018-03-29 DIAGNOSIS — Z86718 Personal history of other venous thrombosis and embolism: Secondary | ICD-10-CM | POA: Diagnosis not present

## 2018-03-29 DIAGNOSIS — Z7901 Long term (current) use of anticoagulants: Secondary | ICD-10-CM | POA: Diagnosis not present

## 2018-03-29 LAB — CBC WITH DIFFERENTIAL (CANCER CENTER ONLY)
Abs Immature Granulocytes: 0.02 10*3/uL (ref 0.00–0.07)
Basophils Absolute: 0 10*3/uL (ref 0.0–0.1)
Basophils Relative: 0 %
Eosinophils Absolute: 0.1 10*3/uL (ref 0.0–0.5)
Eosinophils Relative: 2 %
HCT: 34.3 % — ABNORMAL LOW (ref 36.0–46.0)
Hemoglobin: 10.9 g/dL — ABNORMAL LOW (ref 12.0–15.0)
Immature Granulocytes: 0 %
Lymphocytes Relative: 20 %
Lymphs Abs: 1.5 10*3/uL (ref 0.7–4.0)
MCH: 25.8 pg — ABNORMAL LOW (ref 26.0–34.0)
MCHC: 31.8 g/dL (ref 30.0–36.0)
MCV: 81.3 fL (ref 80.0–100.0)
Monocytes Absolute: 0.6 10*3/uL (ref 0.1–1.0)
Monocytes Relative: 8 %
Neutro Abs: 5.1 10*3/uL (ref 1.7–7.7)
Neutrophils Relative %: 70 %
Platelet Count: 346 10*3/uL (ref 150–400)
RBC: 4.22 MIL/uL (ref 3.87–5.11)
RDW: 18.4 % — ABNORMAL HIGH (ref 11.5–15.5)
WBC Count: 7.4 10*3/uL (ref 4.0–10.5)
nRBC: 0 % (ref 0.0–0.2)

## 2018-03-29 LAB — PROTIME-INR
INR: 2.17
Prothrombin Time: 23.9 seconds — ABNORMAL HIGH (ref 11.4–15.2)

## 2018-03-29 NOTE — Telephone Encounter (Signed)
Scheduled appt per 1/09 sch message - pt is aware of appt date and time

## 2018-03-29 NOTE — Telephone Encounter (Signed)
-----   Message from Owens Shark, NP sent at 03/29/2018  8:54 AM EST ----- Please let her know the PT/INR is in therapeutic range.  Continue Coumadin at the current dose.  Follow-up as scheduled in 1 month.

## 2018-03-29 NOTE — Telephone Encounter (Signed)
Called patient and let her know that per Ned Card, NP the PT/INR is in therapeutic range. Continue Coumadin at the current dose. Follow-up as scheduled in 1 month. Patient verbalized understanding.

## 2018-04-12 ENCOUNTER — Other Ambulatory Visit: Payer: Self-pay | Admitting: Oncology

## 2018-04-15 ENCOUNTER — Telehealth: Payer: Self-pay | Admitting: *Deleted

## 2018-04-15 NOTE — Telephone Encounter (Signed)
This RN spoke with staff with Dr. Zenia Resides office. Was informed cararact surgery has no bleeding and he does not have patient hold coumadin for this surgery. Patient notified.

## 2018-04-15 NOTE — Telephone Encounter (Signed)
Patient left VM reporting eye surgery on 04/22/18 and what to do with her coumadin? Called back and left her a VM asking what specific type of eye surgery and by whom?

## 2018-04-15 NOTE — Telephone Encounter (Signed)
Returned call to report she is having cataract surgery on 04/22/18 by Dr. Warden Fillers.

## 2018-04-16 ENCOUNTER — Telehealth: Payer: Self-pay | Admitting: *Deleted

## 2018-04-16 ENCOUNTER — Other Ambulatory Visit: Payer: Self-pay | Admitting: Oncology

## 2018-04-16 DIAGNOSIS — I82403 Acute embolism and thrombosis of unspecified deep veins of lower extremity, bilateral: Secondary | ICD-10-CM

## 2018-04-16 DIAGNOSIS — Z7901 Long term (current) use of anticoagulants: Secondary | ICD-10-CM

## 2018-04-16 MED ORDER — WARFARIN SODIUM 10 MG PO TABS: ORAL_TABLET | ORAL | 0 refills | Status: DC

## 2018-04-16 NOTE — Telephone Encounter (Signed)
Called [name] [phone]) to confirm dose and how she is taking warfarin.  "I take Warfarin 10 mg every Mon., Wed., Fri.  Warfarin 5 mg every Tues., Thurs., Fri., Sat. and Sun.  I do not need the 5 mg.  I ran out of the 10 mg so I started to double up on the 5 mg tablets."  Current warfarin Surescript request denied.  Refill for warfarin 10 mg sent at this time.

## 2018-04-25 ENCOUNTER — Other Ambulatory Visit: Payer: Self-pay | Admitting: *Deleted

## 2018-04-25 DIAGNOSIS — Z7901 Long term (current) use of anticoagulants: Secondary | ICD-10-CM

## 2018-04-29 ENCOUNTER — Telehealth: Payer: Self-pay

## 2018-04-29 ENCOUNTER — Inpatient Hospital Stay: Payer: Medicare Other

## 2018-04-29 ENCOUNTER — Inpatient Hospital Stay: Payer: Medicare Other | Attending: Oncology | Admitting: Oncology

## 2018-04-29 VITALS — BP 141/81 | HR 86 | Temp 98.3°F | Resp 19 | Wt 276.7 lb

## 2018-04-29 DIAGNOSIS — Z7901 Long term (current) use of anticoagulants: Secondary | ICD-10-CM | POA: Diagnosis not present

## 2018-04-29 DIAGNOSIS — Z90710 Acquired absence of both cervix and uterus: Secondary | ICD-10-CM | POA: Diagnosis not present

## 2018-04-29 DIAGNOSIS — I10 Essential (primary) hypertension: Secondary | ICD-10-CM | POA: Insufficient documentation

## 2018-04-29 DIAGNOSIS — D6862 Lupus anticoagulant syndrome: Secondary | ICD-10-CM | POA: Diagnosis not present

## 2018-04-29 DIAGNOSIS — D649 Anemia, unspecified: Secondary | ICD-10-CM | POA: Insufficient documentation

## 2018-04-29 DIAGNOSIS — Z86718 Personal history of other venous thrombosis and embolism: Secondary | ICD-10-CM | POA: Diagnosis present

## 2018-04-29 DIAGNOSIS — J449 Chronic obstructive pulmonary disease, unspecified: Secondary | ICD-10-CM | POA: Insufficient documentation

## 2018-04-29 LAB — PROTIME-INR
INR: 1.87
Prothrombin Time: 21.3 seconds — ABNORMAL HIGH (ref 11.4–15.2)

## 2018-04-29 NOTE — Progress Notes (Signed)
  [facility] OFFICE PROGRESS NOTE   Diagnosis: Deep vein thrombosis, Coumadin anticoagulation  INTERVAL HISTORY:   [name] returns as scheduled.  She is being evaluated by neurology for headaches.  No bleeding.  No other complaint.  She continues Coumadin anticoagulation.  Objective:  Vital signs in last 24 hours:  Blood pressure (!) 141/81, pulse 86, temperature 98.3 F (36.8 C), temperature source Oral, resp. rate 19, weight 276 lb 11.2 oz (125.5 kg), SpO2 96 %.    Resp: Lungs clear bilaterally Cardio: Regular rate and rhythm GI: No hepatosplenomegaly, nontender Vascular: Leg edema, the left lower leg is slightly larger than the right side    Lab Results:  Lab Results  Component Value Date   WBC 7.4 03/29/2018   HGB 10.9 (L) 03/29/2018   HCT 34.3 (L) 03/29/2018   MCV 81.3 03/29/2018   PLT 346 03/29/2018   NEUTROABS 5.1 03/29/2018    CMP  Lab Results  Component Value Date   NA 141 03/04/2018   K 3.2 (L) 03/04/2018   CL 101 03/04/2018   CO2 30 03/04/2018   GLUCOSE 115 (H) 03/04/2018   BUN 10 03/04/2018   CREATININE 0.62 03/04/2018   CALCIUM 8.0 (L) 03/04/2018   PROT 6.6 02/28/2017   ALBUMIN 3.2 (L) 02/28/2017   AST 21 02/28/2017   ALT 21 02/28/2017   ALKPHOS 74 02/28/2017   BILITOT 0.5 02/28/2017   GFRNONAA >60 03/04/2018   GFRAA >60 03/04/2018    Lab Results  Component Value Date   INR 1.87 04/29/2018    Medications: I have reviewed the patient's current medications.   Assessment/Plan: 1. History of bilateral lower extremity deep vein thrombosis. She continues Coumadin anticoagulation. 2. History of a positive lupus anticoagulant. 3. History of a positive beta-2-glycoprotein IgA antibody. 4. Chronic bilateral leg pain - negative bilateral Doppler January 2010. 5. History of hypertension. 6. COPD. 7. Status post hysterectomy. 8. Remote history of cocaine abuse. 9. History of an adrenal lesion on a CT scan in 2008 - status post  a repeat CT on June 16, 2009, showing slow enlargement of the right adrenal nodule since 2006, most consistent with an adenoma. Stable on a CT 04/24/2014 10. History of rectal and vaginal bleeding in May 2009.  11. Status post carpal tunnel surgery. 12. 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 Right knee replacement September 2011. 13. Multiple orthopedic conditions. 14. Placement of IVC filter preoperatively before knee replacement. The IVC filter was retrieved 03/01/2011. 15. Anemia. Ferritin in normal range at 82 on 08/18/2013; 61 on 07/10/2016 16. Status post lumbar fusion surgery October 2013 17. Right hydronephrosis-evaluated by Dr. Tresa Moore 18. Left ankle swelling/pain 07/24/2016 19. Mild anemia noted on hospital admission 2018 20. Colonoscopy 04/19/2017-entire examined colon normal.  Repeat colonoscopy in 10 years for surveillance.    Disposition: [name] appears unchanged.  She will continue follow-up with neurology for evaluation of headaches.  The PT/INR is just below the therapeutic range today.  She will continue Coumadin at current dose.  She will return for a PT/INR in 1 month.  She has chronic mild anemia.  [name] will return for an office and lab visit in 6 months.  [name], MD  04/29/2018  8:56 AM

## 2018-04-29 NOTE — Telephone Encounter (Signed)
Printed avs and calender of upcoming appointment. Per 2/10

## 2018-05-16 ENCOUNTER — Encounter: Payer: Self-pay | Admitting: Podiatry

## 2018-05-16 ENCOUNTER — Ambulatory Visit (INDEPENDENT_AMBULATORY_CARE_PROVIDER_SITE_OTHER): Payer: Medicare Other | Admitting: Podiatry

## 2018-05-16 DIAGNOSIS — E1142 Type 2 diabetes mellitus with diabetic polyneuropathy: Secondary | ICD-10-CM

## 2018-05-16 DIAGNOSIS — B351 Tinea unguium: Secondary | ICD-10-CM | POA: Diagnosis not present

## 2018-05-16 DIAGNOSIS — M79675 Pain in left toe(s): Secondary | ICD-10-CM

## 2018-05-16 DIAGNOSIS — M79674 Pain in right toe(s): Secondary | ICD-10-CM

## 2018-05-16 NOTE — Patient Instructions (Signed)

## 2018-05-25 NOTE — Progress Notes (Signed)
Subjective: [name] presents today with history of neuropathy with cc of painful, mycotic toenails.  Pain is aggravated when wearing enclosed shoe gear and relieved with periodic professional debridement.  Patient has peripheral neuropathy managed with gabapentin.  Colleen Ebbs, MD is her PCP.    Current Outpatient Medications:  .  albuterol (PROVENTIL HFA;VENTOLIN HFA) 108 (90 BASE) MCG/ACT inhaler, Inhale 2 puffs into the lungs every 4 (four) hours as needed for shortness of breath., Disp: , Rfl:  .  albuterol (PROVENTIL) (2.5 MG/3ML) 0.083% nebulizer solution, Take 2.5 mg by nebulization every 6 (six) hours as needed for wheezing or shortness of breath., Disp: , Rfl:  .  allopurinol (ZYLOPRIM) 100 MG tablet, Take 100mg  by mouth daily, Disp: , Rfl: 2 .  amLODipine (NORVASC) 5 MG tablet, Take 5 mg by mouth daily., Disp: , Rfl:  .  canagliflozin (INVOKANA) 100 MG TABS tablet, Take 100 mg by mouth daily., Disp: , Rfl:  .  CARTIA XT 240 MG 24 hr capsule, Take 240 mg by mouth daily., Disp: , Rfl: 3 .  cloNIDine (CATAPRES) 0.1 MG tablet, Take 0.1 mg by mouth 2 (two) times daily., Disp: , Rfl:  .  diclofenac sodium (VOLTAREN) 1 % GEL, Apply 2 g topically daily as needed (fibromyalgia pain)., Disp: , Rfl:  .  DULoxetine (CYMBALTA) 20 MG capsule, Take 20 mg by mouth daily., Disp: , Rfl: 2 .  DULoxetine (CYMBALTA) 60 MG capsule, Take 60 mg by mouth at bedtime. , Disp: , Rfl:  .  gabapentin (NEURONTIN) 300 MG capsule, Take 300 mg by mouth at bedtime. , Disp: , Rfl:  .  hydrochlorothiazide (HYDRODIURIL) 12.5 MG tablet, Take 12.5 mg by mouth daily. , Disp: , Rfl: 2 .  ketorolac (ACULAR) 0.4 % SOLN, Place 1 drop into the right eye 4 (four) times daily. Day before surgery, Disp: , Rfl:  .  LINZESS 145 MCG CAPS capsule, Take 145 mcg by mouth daily before breakfast. , Disp: , Rfl: 2 .  magnesium oxide (MAG-OX) 400 (241.3 Mg) MG tablet, Take 1 tablet (400 mg total) by mouth daily., Disp: 10 tablet, Rfl:  0 .  meclizine (ANTIVERT) 25 MG tablet, Take 1 tablet (25 mg total) by mouth 3 (three) times daily as needed for dizziness., Disp: 30 tablet, Rfl: 0 .  metFORMIN (GLUCOPHAGE) 500 MG tablet, Take 500 mg by mouth at bedtime., Disp: , Rfl:  .  metoCLOPramide (REGLAN) 10 MG tablet, Take 1 tablet (10 mg total) by mouth every 6 (six) hours as needed for nausea (or headache)., Disp: 30 tablet, Rfl: 0 .  ofloxacin (OCUFLOX) 0.3 % ophthalmic solution, Place 1 drop into the right eye 4 (four) times daily. Start 1 day before surgery, Disp: , Rfl:  .  omeprazole (PRILOSEC) 20 MG capsule, Take 20 mg by mouth 2 (two) times daily., Disp: , Rfl:  .  oxyCODONE (OXY IR/ROXICODONE) 5 MG immediate release tablet, Take 1-2 tablets (5-10 mg total) by mouth every 4 (four) hours as needed for severe pain., Disp: 20 tablet, Rfl: 0 .  prednisoLONE acetate (PRED FORTE) 1 % ophthalmic suspension, Place 1 drop into both eyes 4 (four) times daily. After surgery, Disp: , Rfl:  .  QUEtiapine (SEROQUEL) 50 MG tablet, Take 50 mg by mouth at bedtime. , Disp: , Rfl:  .  simvastatin (ZOCOR) 40 MG tablet, Take 40 mg by mouth daily., Disp: , Rfl:  .  tiZANidine (ZANAFLEX) 4 MG tablet, Take 4 mg by mouth 2 (two)  times daily as needed for muscle spasms., Disp: , Rfl: 2 .  traMADol (ULTRAM) 50 MG tablet, TAKE 1-2 TABLETS BY MOUTH EVERY DAY AS NEEDED FOR PAIN (Patient taking differently: TAKE 50-100mg  BY MOUTH EVERY DAY AS NEEDED FOR PAIN), Disp: 60 tablet, Rfl: 0 .  warfarin (COUMADIN) 10 MG tablet, TAKE 1 TABLET BY MOUTH EVERY MONDAY, WEDNESDAY, FRIDAY, Disp: 38 tablet, Rfl: 0 .  warfarin (COUMADIN) 5 MG tablet, Take 5 mg daily (except on MWF = 10 mg tablet) or as directed (Patient taking differently: Take 5 mg by mouth on Tues, Thurs, Sat, and Sun), Disp: 50 tablet, Rfl: 1  Allergies  Allergen Reactions  . Lisinopril Swelling    THROAT SWELLING Pt states her throat started to "close up" after being on it for "so long."  .  Corticosteroids Rash    States she developed a rash on her tongue after a steroid injection in her bac    Objective:  Vascular Examination: Capillary refill time <3 seconds x 10 digits.  Dorsalis pedis and Posterior tibial pulses present b/l.   Digital hair x 10 digits was absent.  Skin temperature gradient WNL b/l.  Dermatological Examination: Skin with normal turgor, texture and tone b/l.  Toenails 1-5 b/l discolored, thick, dystrophic with subungual debris and pain with palpation to nailbeds due to thickness of nails.  Musculoskeletal: Muscle strength 5/5 to all muscle groups b/l  Neurological: Sensation with 10 gram monofilament is absent b/l Vibratory sensation absent b/l  Assessment: 1. Painful onychomycosis toenails 1-5 b/l 2. NIDDM with neuropathy  Plan: 1. Toenails 1-5 left and 2-5 right were debrided in length and girth without iatrogenic bleeding. Patient refused debridement of right great toe nail plate. 2. Patient to continue soft, supportive shoe gear 3. Patient to report any pedal injuries to medical professional  4. Follow up 3 months.  5. Patient/POA to call should there be a concern in the interim.

## 2018-05-27 ENCOUNTER — Inpatient Hospital Stay: Payer: Medicare Other | Attending: Oncology

## 2018-05-27 DIAGNOSIS — Z86718 Personal history of other venous thrombosis and embolism: Secondary | ICD-10-CM | POA: Insufficient documentation

## 2018-05-27 DIAGNOSIS — Z7901 Long term (current) use of anticoagulants: Secondary | ICD-10-CM | POA: Diagnosis not present

## 2018-05-27 DIAGNOSIS — D649 Anemia, unspecified: Secondary | ICD-10-CM

## 2018-05-27 LAB — CBC WITH DIFFERENTIAL (CANCER CENTER ONLY)
Abs Immature Granulocytes: 0.02 10*3/uL (ref 0.00–0.07)
Basophils Absolute: 0 10*3/uL (ref 0.0–0.1)
Basophils Relative: 0 %
Eosinophils Absolute: 0.2 10*3/uL (ref 0.0–0.5)
Eosinophils Relative: 3 %
HCT: 35.4 % — ABNORMAL LOW (ref 36.0–46.0)
Hemoglobin: 11.1 g/dL — ABNORMAL LOW (ref 12.0–15.0)
Immature Granulocytes: 0 %
Lymphocytes Relative: 37 %
Lymphs Abs: 1.9 10*3/uL (ref 0.7–4.0)
MCH: 25.6 pg — ABNORMAL LOW (ref 26.0–34.0)
MCHC: 31.4 g/dL (ref 30.0–36.0)
MCV: 81.8 fL (ref 80.0–100.0)
Monocytes Absolute: 0.4 10*3/uL (ref 0.1–1.0)
Monocytes Relative: 8 %
Neutro Abs: 2.7 10*3/uL (ref 1.7–7.7)
Neutrophils Relative %: 52 %
Platelet Count: 333 10*3/uL (ref 150–400)
RBC: 4.33 MIL/uL (ref 3.87–5.11)
RDW: 17.9 % — ABNORMAL HIGH (ref 11.5–15.5)
WBC Count: 5.2 10*3/uL (ref 4.0–10.5)
nRBC: 0 % (ref 0.0–0.2)

## 2018-05-27 LAB — PROTIME-INR
INR: 1.8 — ABNORMAL HIGH (ref 0.8–1.2)
Prothrombin Time: 20.8 seconds — ABNORMAL HIGH (ref 11.4–15.2)

## 2018-06-10 ENCOUNTER — Other Ambulatory Visit: Payer: Self-pay | Admitting: Oncology

## 2018-07-08 ENCOUNTER — Other Ambulatory Visit: Payer: Self-pay | Admitting: Oncology

## 2018-07-08 DIAGNOSIS — I82403 Acute embolism and thrombosis of unspecified deep veins of lower extremity, bilateral: Secondary | ICD-10-CM

## 2018-07-08 DIAGNOSIS — Z7901 Long term (current) use of anticoagulants: Secondary | ICD-10-CM

## 2018-08-07 ENCOUNTER — Telehealth: Payer: Self-pay | Admitting: *Deleted

## 2018-08-07 DIAGNOSIS — Z7901 Long term (current) use of anticoagulants: Secondary | ICD-10-CM

## 2018-08-07 NOTE — Telephone Encounter (Signed)
Called to ask when her next lab should be and next visit with Dr. Benay Spice. Informed she is past due her PT/INR. Scheduled for Friday this week. Confirmed her current coumadin to be 5 mg daily and 10 mg on MWF.  Informed her of the lab/OV in August.

## 2018-08-09 ENCOUNTER — Inpatient Hospital Stay: Payer: Medicare Other | Attending: Oncology

## 2018-08-09 ENCOUNTER — Telehealth: Payer: Self-pay | Admitting: *Deleted

## 2018-08-09 ENCOUNTER — Other Ambulatory Visit: Payer: Self-pay

## 2018-08-09 DIAGNOSIS — Z7901 Long term (current) use of anticoagulants: Secondary | ICD-10-CM | POA: Diagnosis not present

## 2018-08-09 LAB — PROTIME-INR
INR: 1.4 — ABNORMAL HIGH (ref 0.8–1.2)
Prothrombin Time: 16.9 seconds — ABNORMAL HIGH (ref 11.4–15.2)

## 2018-08-09 NOTE — Telephone Encounter (Signed)
-----   Message from Ladell Pier, MD sent at 08/09/2018  1:30 PM EDT ----- Please call patient, is she taking coumadin, repeat PT INR in 2 weeks

## 2018-08-09 NOTE — Telephone Encounter (Signed)
Called patient with low INR 1.4. She reports she has missed no doses of coumadin. Takes 5 mg daily, except 10 mg on MWF. Only new med is zonisamide 50 mg bid. Confirmed with pharmacy, this does not interact with coumadin. MD wants repeat lab in 2 weeks. Scheduling message sent.

## 2018-08-14 ENCOUNTER — Telehealth: Payer: Self-pay | Admitting: Oncology

## 2018-08-14 NOTE — Telephone Encounter (Signed)
Scheduled appt per sch msg. Called and spoke with patient. Confirmed date and time °

## 2018-08-15 ENCOUNTER — Ambulatory Visit: Payer: Medicare Other | Admitting: Podiatry

## 2018-08-23 ENCOUNTER — Inpatient Hospital Stay: Payer: Medicare Other

## 2018-08-26 ENCOUNTER — Telehealth: Payer: Self-pay

## 2018-08-26 ENCOUNTER — Other Ambulatory Visit: Payer: Self-pay

## 2018-08-26 ENCOUNTER — Inpatient Hospital Stay: Payer: Medicare Other | Attending: Oncology

## 2018-08-26 DIAGNOSIS — R600 Localized edema: Secondary | ICD-10-CM | POA: Diagnosis not present

## 2018-08-26 DIAGNOSIS — J449 Chronic obstructive pulmonary disease, unspecified: Secondary | ICD-10-CM | POA: Diagnosis not present

## 2018-08-26 DIAGNOSIS — D6489 Other specified anemias: Secondary | ICD-10-CM | POA: Insufficient documentation

## 2018-08-26 DIAGNOSIS — Z86718 Personal history of other venous thrombosis and embolism: Secondary | ICD-10-CM | POA: Diagnosis present

## 2018-08-26 DIAGNOSIS — Z7901 Long term (current) use of anticoagulants: Secondary | ICD-10-CM | POA: Diagnosis not present

## 2018-08-26 LAB — PROTIME-INR
INR: 2.1 — ABNORMAL HIGH (ref 0.8–1.2)
Prothrombin Time: 23.2 seconds — ABNORMAL HIGH (ref 11.4–15.2)

## 2018-08-26 NOTE — Telephone Encounter (Signed)
Spoke with pt advised per Dr Benay Spice to stay on the same dose as coumadin and we will repeat PT/INR in 1 month. Pt verbalized understanding.

## 2018-08-26 NOTE — Telephone Encounter (Signed)
-----   Message from Ladell Pier, MD sent at 08/26/2018  1:36 PM EDT ----- Please call patient,, same Coumadin, repeat PT/INR in 1 month

## 2018-09-16 ENCOUNTER — Other Ambulatory Visit: Payer: Self-pay

## 2018-09-16 ENCOUNTER — Encounter: Payer: Self-pay | Admitting: Podiatry

## 2018-09-16 ENCOUNTER — Ambulatory Visit (INDEPENDENT_AMBULATORY_CARE_PROVIDER_SITE_OTHER): Payer: Medicare Other | Admitting: Podiatry

## 2018-09-16 VITALS — Temp 97.9°F

## 2018-09-16 DIAGNOSIS — E119 Type 2 diabetes mellitus without complications: Secondary | ICD-10-CM | POA: Diagnosis not present

## 2018-09-16 DIAGNOSIS — M79674 Pain in right toe(s): Secondary | ICD-10-CM | POA: Diagnosis not present

## 2018-09-16 DIAGNOSIS — B351 Tinea unguium: Secondary | ICD-10-CM | POA: Diagnosis not present

## 2018-09-16 DIAGNOSIS — M79675 Pain in left toe(s): Secondary | ICD-10-CM

## 2018-09-16 NOTE — Patient Instructions (Signed)
Diabetes Mellitus and Foot Care Foot care is an important part of your health, especially when you have diabetes. Diabetes may cause you to have problems because of poor blood flow (circulation) to your feet and legs, which can cause your skin to:  Become thinner and drier.  Break more easily.  Heal more slowly.  Peel and crack. You may also have nerve damage (neuropathy) in your legs and feet, causing decreased feeling in them. This means that you may not notice minor injuries to your feet that could lead to more serious problems. Noticing and addressing any potential problems early is the best way to prevent future foot problems. How to care for your feet Foot hygiene  Wash your feet daily with warm water and mild soap. Do not use hot water. Then, pat your feet and the areas between your toes until they are completely dry. Do not soak your feet as this can dry your skin.  Trim your toenails straight across. Do not dig under them or around the cuticle. File the edges of your nails with an emery board or nail file.  Apply a moisturizing lotion or petroleum jelly to the skin on your feet and to dry, brittle toenails. Use lotion that does not contain alcohol and is unscented. Do not apply lotion between your toes. Shoes and socks  Wear clean socks or stockings every day. Make sure they are not too tight. Do not wear knee-high stockings since they may decrease blood flow to your legs.  Wear shoes that fit properly and have enough cushioning. Always look in your shoes before you put them on to be sure there are no objects inside.  To break in new shoes, wear them for just a few hours a day. This prevents injuries on your feet. Wounds, scrapes, corns, and calluses  Check your feet daily for blisters, cuts, bruises, sores, and redness. If you cannot see the bottom of your feet, use a mirror or ask someone for help.  Do not cut corns or calluses or try to remove them with medicine.  If you  find a minor scrape, cut, or break in the skin on your feet, keep it and the skin around it clean and dry. You may clean these areas with mild soap and water. Do not clean the area with peroxide, alcohol, or iodine.  If you have a wound, scrape, corn, or callus on your foot, look at it several times a day to make sure it is healing and not infected. Check for: ? Redness, swelling, or pain. ? Fluid or blood. ? Warmth. ? Pus or a bad smell. General instructions  Do not cross your legs. This may decrease blood flow to your feet.  Do not use heating pads or hot water bottles on your feet. They may burn your skin. If you have lost feeling in your feet or legs, you may not know this is happening until it is too late.  Protect your feet from hot and cold by wearing shoes, such as at the beach or on hot pavement.  Schedule a complete foot exam at least once a year (annually) or more often if you have foot problems. If you have foot problems, report any cuts, sores, or bruises to your health care provider immediately. Contact a health care provider if:  You have a medical condition that increases your risk of infection and you have any cuts, sores, or bruises on your feet.  You have an injury that is not   healing.  You have redness on your legs or feet.  You feel burning or tingling in your legs or feet.  You have pain or cramps in your legs and feet.  Your legs or feet are numb.  Your feet always feel cold.  You have pain around a toenail. Get help right away if:  You have a wound, scrape, corn, or callus on your foot and: ? You have pain, swelling, or redness that gets worse. ? You have fluid or blood coming from the wound, scrape, corn, or callus. ? Your wound, scrape, corn, or callus feels warm to the touch. ? You have pus or a bad smell coming from the wound, scrape, corn, or callus. ? You have a fever. ? You have a red line going up your leg. Summary  Check your feet every day  for cuts, sores, red spots, swelling, and blisters.  Moisturize feet and legs daily.  Wear shoes that fit properly and have enough cushioning.  If you have foot problems, report any cuts, sores, or bruises to your health care provider immediately.  Schedule a complete foot exam at least once a year (annually) or more often if you have foot problems. This information is not intended to replace advice given to you by your health care provider. Make sure you discuss any questions you have with your health care provider. Document Released: 03/03/2000 Document Revised: 04/18/2017 Document Reviewed: 04/07/2016 Elsevier Patient Education  2020 [employer]  

## 2018-09-21 NOTE — Progress Notes (Signed)
Subjective: [name] presents today with history of neuropathy. Patient seen for follow up of chronic, painful mycotic toenails and callus(es)/corn(s) which interfere with daily activities and routine tasks.  Pain is aggravated when wearing enclosed shoe gear. Pain is getting progressively worse and relieved with periodic professional debridement.   Colleen Ebbs, MD is her PCP. Last visit was 3 months ago per patient recall.   Current Outpatient Medications:  .  albuterol (PROVENTIL HFA;VENTOLIN HFA) 108 (90 BASE) MCG/ACT inhaler, Inhale 2 puffs into the lungs every 4 (four) hours as needed for shortness of breath., Disp: , Rfl:  .  albuterol (PROVENTIL) (2.5 MG/3ML) 0.083% nebulizer solution, Take 2.5 mg by nebulization every 6 (six) hours as needed for wheezing or shortness of breath., Disp: , Rfl:  .  allopurinol (ZYLOPRIM) 100 MG tablet, Take 100mg  by mouth daily, Disp: , Rfl: 2 .  amLODipine (NORVASC) 5 MG tablet, Take 5 mg by mouth daily., Disp: , Rfl:  .  canagliflozin (INVOKANA) 100 MG TABS tablet, Take 100 mg by mouth daily., Disp: , Rfl:  .  CARTIA XT 240 MG 24 hr capsule, Take 240 mg by mouth daily., Disp: , Rfl: 3 .  cloNIDine (CATAPRES) 0.1 MG tablet, Take 0.1 mg by mouth 2 (two) times daily., Disp: , Rfl:  .  diclofenac sodium (VOLTAREN) 1 % GEL, Apply 2 g topically daily as needed (fibromyalgia pain)., Disp: , Rfl:  .  DULoxetine (CYMBALTA) 20 MG capsule, Take 20 mg by mouth daily., Disp: , Rfl: 2 .  DULoxetine (CYMBALTA) 60 MG capsule, Take 60 mg by mouth at bedtime. , Disp: , Rfl:  .  fluticasone (FLONASE) 50 MCG/ACT nasal spray, U 2 SPRAYS IEN QPM, Disp: , Rfl:  .  gabapentin (NEURONTIN) 300 MG capsule, Take 300 mg by mouth at bedtime. , Disp: , Rfl:  .  hydrochlorothiazide (HYDRODIURIL) 12.5 MG tablet, Take 12.5 mg by mouth daily. , Disp: , Rfl: 2 .  ketorolac (ACULAR) 0.4 % SOLN, Place 1 drop into the right eye 4 (four) times daily. Day before surgery, Disp: , Rfl:  .   LINZESS 145 MCG CAPS capsule, Take 145 mcg by mouth daily before breakfast. , Disp: , Rfl: 2 .  magnesium oxide (MAG-OX) 400 (241.3 Mg) MG tablet, Take 1 tablet (400 mg total) by mouth daily., Disp: 10 tablet, Rfl: 0 .  magnesium oxide (MAG-OX) 400 MG tablet, Take by mouth., Disp: , Rfl:  .  meclizine (ANTIVERT) 25 MG tablet, Take 1 tablet (25 mg total) by mouth 3 (three) times daily as needed for dizziness., Disp: 30 tablet, Rfl: 0 .  metFORMIN (GLUCOPHAGE) 500 MG tablet, Take 500 mg by mouth at bedtime., Disp: , Rfl:  .  metoCLOPramide (REGLAN) 10 MG tablet, Take 1 tablet (10 mg total) by mouth every 6 (six) hours as needed for nausea (or headache)., Disp: 30 tablet, Rfl: 0 .  ofloxacin (OCUFLOX) 0.3 % ophthalmic solution, Place 1 drop into the right eye 4 (four) times daily. Start 1 day before surgery, Disp: , Rfl:  .  omeprazole (PRILOSEC) 20 MG capsule, Take 20 mg by mouth 2 (two) times daily., Disp: , Rfl:  .  oxyCODONE (OXY IR/ROXICODONE) 5 MG immediate release tablet, Take 1-2 tablets (5-10 mg total) by mouth every 4 (four) hours as needed for severe pain., Disp: 20 tablet, Rfl: 0 .  potassium chloride (MICRO-K) 10 MEQ CR capsule, , Disp: , Rfl:  .  prednisoLONE acetate (PRED FORTE) 1 % ophthalmic suspension,  Place 1 drop into both eyes 4 (four) times daily. After surgery, Disp: , Rfl:  .  QUEtiapine (SEROQUEL) 50 MG tablet, Take 50 mg by mouth at bedtime. , Disp: , Rfl:  .  simvastatin (ZOCOR) 40 MG tablet, Take 40 mg by mouth daily., Disp: , Rfl:  .  tiZANidine (ZANAFLEX) 4 MG tablet, Take 4 mg by mouth 2 (two) times daily as needed for muscle spasms., Disp: , Rfl: 2 .  traMADol (ULTRAM) 50 MG tablet, TAKE 1-2 TABLETS BY MOUTH EVERY DAY AS NEEDED FOR PAIN (Patient taking differently: TAKE 50-100mg  BY MOUTH EVERY DAY AS NEEDED FOR PAIN), Disp: 60 tablet, Rfl: 0 .  Vitamin D, Ergocalciferol, (DRISDOL) 1.25 MG (50000 UT) CAPS capsule, TK 1 C PO WEEKLY, Disp: , Rfl:  .  warfarin (COUMADIN) 10  MG tablet, TAKE 1 TABLET BY MOUTH ON MONDAY, WEDNESDAY, FRIDAY, Disp: 38 tablet, Rfl: 3 .  warfarin (COUMADIN) 5 MG tablet, TAKE 1 TABLET BY MOUTH DAILY( EXCEPT ON MONDAY, WEDNESDAY, FRIDAY TAKE 2 TABLETS) OR AS DIRECTED, Disp: 50 tablet, Rfl: 1 .  zonisamide (ZONEGRAN) 50 MG capsule, Take 50 mg by mouth 2 (two) times a day., Disp: , Rfl:   Allergies  Allergen Reactions  . Lisinopril Swelling    THROAT SWELLING Pt states her throat started to "close up" after being on it for "so long."  . Corticosteroids Rash    States she developed a rash on her tongue after a steroid injection in her bac    Objective: Vitals:   09/16/18 0953  Temp: 97.9 F (36.6 C)    Vascular Examination: Capillary refill time <3 seconds x 10 digits.  Dorsalis pedis pulses present b/l.  Posterior tibial pulses present b/l.  No digital hair x 10 digits.  Skin temperature WNL b/l.  Edema b/l ankles.  Dermatological Examination: Skin with normal turgor, texture and tone b/l.  Toenails 1-5 b/l discolored, thick, dystrophic with subungual debris and pain with palpation to nailbeds due to thickness of nails.  Musculoskeletal: Muscle strength 5/5 to all LE muscle groups.  Neurological: Sensation absent with 10 gram monofilament.  Vibratory sensation absent b/l.  Assessment: 1. Painful onychomycosis toenails 1-5 b/l 2. NIDDM with neuropathy  Plan: 1. Toenails 1-5 b/l were debrided in length and girth without iatrogenic bleeding. 2. Patient to continue soft, supportive shoe gear daily. 3. Patient to report any pedal injuries to medical professional immediately. 4. Follow up 3 months.  5. Patient/POA to call should there be a concern in the interim.

## 2018-09-27 ENCOUNTER — Telehealth: Payer: Self-pay | Admitting: Podiatry

## 2018-09-27 NOTE — Telephone Encounter (Signed)
Pt called back following up on previous message. Pt states she has pressure under her toenails and is interested in having the toenails removed and wanted to know if Dr.Galaway could do this. Pt is a diabetic.

## 2018-09-27 NOTE — Telephone Encounter (Signed)
Pt is still having issues with her toenails, she wanted to know if Dr. Elisha Ponder could removed her toenails.

## 2018-09-30 ENCOUNTER — Telehealth: Payer: Self-pay | Admitting: *Deleted

## 2018-09-30 ENCOUNTER — Encounter: Payer: Self-pay | Admitting: Podiatry

## 2018-09-30 ENCOUNTER — Other Ambulatory Visit: Payer: Self-pay

## 2018-09-30 ENCOUNTER — Ambulatory Visit (INDEPENDENT_AMBULATORY_CARE_PROVIDER_SITE_OTHER): Payer: Medicare Other | Admitting: Podiatry

## 2018-09-30 ENCOUNTER — Other Ambulatory Visit: Payer: Self-pay | Admitting: Oncology

## 2018-09-30 VITALS — Temp 97.3°F

## 2018-09-30 DIAGNOSIS — L600 Ingrowing nail: Secondary | ICD-10-CM

## 2018-09-30 MED ORDER — NEOMYCIN-POLYMYXIN-HC 3.5-10000-1 OT SOLN: OTIC | 1 refills | Status: DC

## 2018-09-30 NOTE — Patient Instructions (Signed)

## 2018-09-30 NOTE — Telephone Encounter (Signed)
I called pt and gave her an appt today at 4:15pm.

## 2018-09-30 NOTE — Telephone Encounter (Signed)
I left message informing pt our office had appt times today to call to be seen, and not to poke around in the toe with a pin.

## 2018-09-30 NOTE — Telephone Encounter (Signed)
Pt states she has a very painful left great toe and the only way she can get any relief from the pressure is poking with pin.

## 2018-10-03 NOTE — Progress Notes (Signed)
Subjective:   Patient ID: Royetta Crochet [place], female   DOB: 63 y.o.   MRN: [id number]   HPI Patient states the left hallux nail has been very thickened and damaged and I wanted to get it taken off for a while and I can do it now.  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  At this point I went ahead and I allowed her to sign consent form and I then infiltrated the left hallux 60 mg like Marcaine mixture and under sterile conditions I remove the hallux nail exposed matrix and applied phenol 3 applications 30 seconds followed by alcohol lavage sterile dressing.  Discussed the right nail which she also wants removed and we will do this in 3 weeks after this wound heals and I explained drop usage and to call with any questions concerns which may arise

## 2018-10-04 ENCOUNTER — Telehealth: Payer: Self-pay | Admitting: Orthopedic Surgery

## 2018-10-04 NOTE — Telephone Encounter (Signed)
IC patient. Not seen since 2018 need ROV She said she will call us back to schedule appt with Dr Marlou Sa

## 2018-10-04 NOTE — Telephone Encounter (Signed)
Patient called needing a handicap placard. The number to contact patient is [phone]

## 2018-10-21 ENCOUNTER — Encounter: Payer: Self-pay | Admitting: Podiatry

## 2018-10-21 ENCOUNTER — Ambulatory Visit (INDEPENDENT_AMBULATORY_CARE_PROVIDER_SITE_OTHER): Payer: Medicare Other | Admitting: Podiatry

## 2018-10-21 ENCOUNTER — Other Ambulatory Visit: Payer: Self-pay

## 2018-10-21 VITALS — Temp 97.9°F

## 2018-10-21 DIAGNOSIS — L600 Ingrowing nail: Secondary | ICD-10-CM | POA: Diagnosis not present

## 2018-10-21 NOTE — Patient Instructions (Signed)

## 2018-10-23 NOTE — Progress Notes (Signed)
Subjective:   Patient ID: Royetta Crochet [place], female   DOB: 63 y.o.   MRN: [id number]   HPI Patient states the left nail is doing real well and the right toenail is so thick I want to get that when removed also as I cannot cut it and it sort   ROS      Objective:  Physical Exam  Neurovascular status intact negative Homans sign noted with patient's left nail that was removed healing very well with good bed no drainage and a severely thickened right hallux nail that is dystrophic and painful when pressed     Assessment:  Doing well from nail removal left with severely thickened right hallux that is painful when palpated     Plan:  H&P reviewed condition and went ahead and explained removal allowing patient to sign consent form understanding risk.  Today I infiltrated the right hallux 60 mg like Marcaine mixture sterile prep applied to the toe and using sterile instrumentation the nail was removed the base was exposed and phenol 3 applications 30 seconds followed by alcohol lavage sterile dressing applied.  Gave instructions for soaks and to continue drop usage and reappoint for reevaluation and encouraged to call with any questions concerns she may have

## 2018-10-25 ENCOUNTER — Other Ambulatory Visit: Payer: Self-pay

## 2018-10-25 DIAGNOSIS — Z7901 Long term (current) use of anticoagulants: Secondary | ICD-10-CM

## 2018-10-28 ENCOUNTER — Inpatient Hospital Stay: Payer: Medicare Other | Attending: Oncology | Admitting: Nurse Practitioner

## 2018-10-28 ENCOUNTER — Inpatient Hospital Stay: Payer: Medicare Other

## 2018-10-28 ENCOUNTER — Other Ambulatory Visit: Payer: Self-pay

## 2018-10-28 ENCOUNTER — Encounter: Payer: Self-pay | Admitting: Nurse Practitioner

## 2018-10-28 ENCOUNTER — Telehealth: Payer: Self-pay | Admitting: Nurse Practitioner

## 2018-10-28 VITALS — BP 144/80 | HR 89 | Temp 98.3°F | Resp 18 | Ht 66.0 in | Wt 279.0 lb

## 2018-10-28 DIAGNOSIS — I10 Essential (primary) hypertension: Secondary | ICD-10-CM | POA: Insufficient documentation

## 2018-10-28 DIAGNOSIS — Z90710 Acquired absence of both cervix and uterus: Secondary | ICD-10-CM | POA: Insufficient documentation

## 2018-10-28 DIAGNOSIS — Z7901 Long term (current) use of anticoagulants: Secondary | ICD-10-CM

## 2018-10-28 DIAGNOSIS — Z86718 Personal history of other venous thrombosis and embolism: Secondary | ICD-10-CM | POA: Diagnosis not present

## 2018-10-28 DIAGNOSIS — I82403 Acute embolism and thrombosis of unspecified deep veins of lower extremity, bilateral: Secondary | ICD-10-CM

## 2018-10-28 DIAGNOSIS — D649 Anemia, unspecified: Secondary | ICD-10-CM

## 2018-10-28 DIAGNOSIS — J449 Chronic obstructive pulmonary disease, unspecified: Secondary | ICD-10-CM | POA: Diagnosis not present

## 2018-10-28 LAB — PROTIME-INR
INR: 1.7 — ABNORMAL HIGH (ref 0.8–1.2)
Prothrombin Time: 19.6 seconds — ABNORMAL HIGH (ref 11.4–15.2)

## 2018-10-28 MED ORDER — WARFARIN SODIUM 10 MG PO TABS: ORAL_TABLET | ORAL | 3 refills | Status: DC

## 2018-10-28 NOTE — Telephone Encounter (Signed)
Scheduled appt per 8/10 los - gave patient AVS and calender per los.   

## 2018-10-28 NOTE — Progress Notes (Signed)
  [facility] OFFICE PROGRESS NOTE   Diagnosis: Deep vein thrombosis, Coumadin anticoagulation  INTERVAL HISTORY:   [name] returns as scheduled.  She continues Coumadin.  She denies bleeding.  No symptoms of thrombosis.  She reports recent ingrown toenail surgery.  She is trying to lose weight.  She is walking and making dietary changes.  Objective:  Vital signs in last 24 hours:  Blood pressure (!) 144/80, pulse 89, temperature 98.3 F (36.8 C), temperature source Oral, resp. rate 18, height 5\' 6"  (1.676 m), weight 279 lb (126.6 kg), SpO2 98 %.    HEENT: No thrush or ulcers. GI: Abdomen soft and nontender.  No hepatosplenomegaly. Vascular: No leg edema.  Calves soft and nontender.  Left lower leg is slightly larger than the right lower leg. Neuro: Alert and oriented.   Lab Results:  Lab Results  Component Value Date   WBC 5.2 05/27/2018   HGB 11.1 (L) 05/27/2018   HCT 35.4 (L) 05/27/2018   MCV 81.8 05/27/2018   PLT 333 05/27/2018   NEUTROABS 2.7 05/27/2018    Imaging:  No results found.  Medications: I have reviewed the patient's current medications.  Assessment/Plan: 1. History of bilateral lower extremity deep vein thrombosis. She continues Coumadin anticoagulation. 2. History of a positive lupus anticoagulant. 3. History of a positive beta-2-glycoprotein IgA antibody. 4. Chronic bilateral leg pain - negative bilateral Doppler January 2010. 5. History of hypertension. 6. COPD 7. Status post hysterectomy. 8. Remote history of cocaine abuse. 9. History of an adrenal lesion on a CT scan in 2008 - status post a repeat CT on June 16, 2009, showing slow enlargement of the right adrenal nodule since 2006, most consistent with an adenoma. Stable on a CT 04/24/2014 10. History of rectal and vaginal bleeding in May 2009.  11. Status post carpal tunnel surgery. 12. 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 Right knee replacement September 2011. 13.  Multiple orthopedic conditions. 14. Placement of IVC filter preoperatively before knee replacement. The IVC filter was retrieved 03/01/2011. 15. Anemia. Ferritin in normal range at 82 on 08/18/2013; 61 on 07/10/2016 16. Status post lumbar fusion surgery October 2013 17. Right hydronephrosis-evaluated by Dr. Tresa Moore 18. Left ankle swelling/pain 07/24/2016 19. Mild anemia noted on hospital admission 2018 20. Colonoscopy 04/19/2017-entire examined colon normal.  Repeat colonoscopy in 10 years for surveillance.    Disposition: [name] appears unchanged.  She continues Coumadin.  The PT/INR is just below [place] range today.  She will continue Coumadin at the current dose and return for a repeat PT/INR in 1 month.  She will return for lab and follow-up in 6 months.  She will contact the office in the interim with any problems.    Ned Card ANP/GNP-BC   10/28/2018  11:04 AM

## 2018-10-29 ENCOUNTER — Telehealth: Payer: Self-pay

## 2018-10-29 NOTE — Telephone Encounter (Signed)
Pt. Informed to continue same coumadin dosage. And we will recheck  Her PT/INR level in 1 month. Pt. Verbalized understanding.

## 2018-10-29 NOTE — Telephone Encounter (Signed)
-----   Message from Ladell Pier, MD sent at 10/28/2018 12:09 PM EDT ----- Please call patient, same Coumadin, check PT/INR in 1 month

## 2018-11-25 IMAGING — MR MR LUMBAR SPINE WO/W CM
5 of 7 series · 32 of 48 positions shown · IV contrast (multihance)
Comparison: Lumbar spine MRI 07/23/2013

CLINICAL DATA: Low back pain radiating to the right leg.

Creatinine was obtained on site at [HOSPITAL] at [HOSPITAL].
Results: Creatinine 0.9 mg/dL.
EXAM:
MRI LUMBAR SPINE WITHOUT AND WITH CONTRAST
TECHNIQUE: Multiplanar and multiecho pulse sequences of the lumbar spine were
obtained without and with intravenous contrast.
CONTRAST:  20mL MULTIHANCE GADOBENATE DIMEGLUMINE 529 MG/ML IV SOLN

[Series 4: T1 · sagittal · 4.0mm · 0.73mm/px · 5 of 13 slices shown (1 of 2)]
[im 1/13]
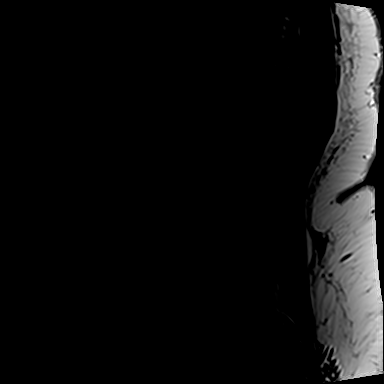
[im 4/13]
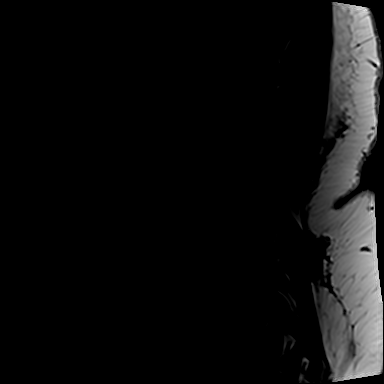
[im 7/13]
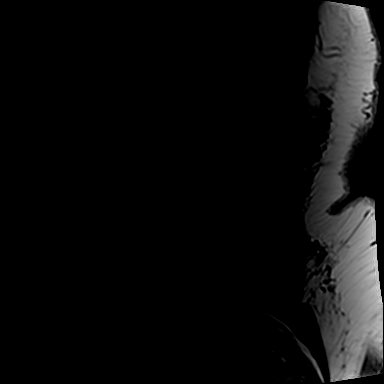
[im 10/13]
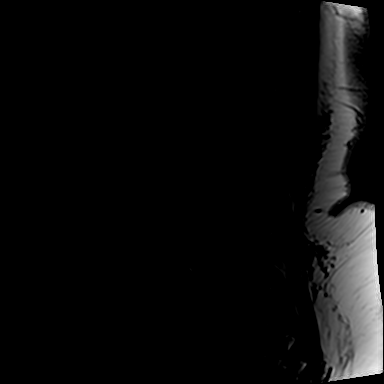
[im 13/13]
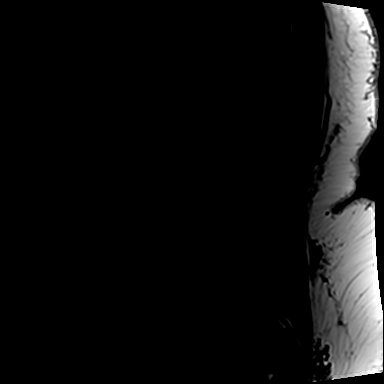

[Series 5: T1 · axial · 4.0mm · 0.52mm/px · z∈[-56,+116]mm · 11 of 31 slices shown (2 of 2)]
[im 1/31]
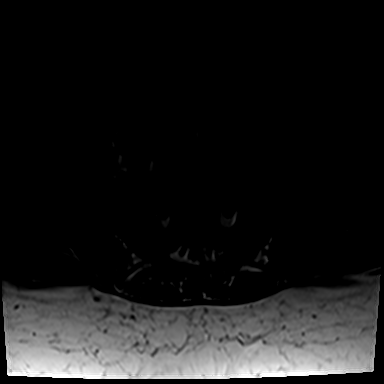
[im 4/31]
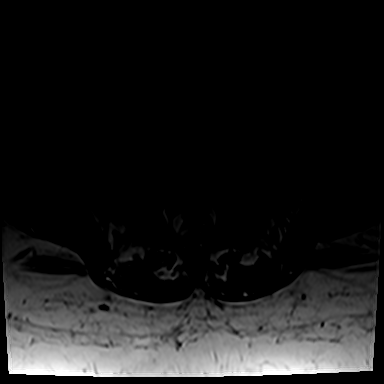
[im 7/31]
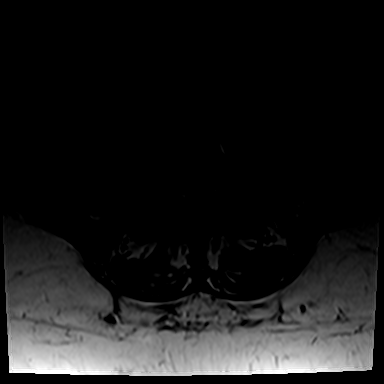
[im 10/31]
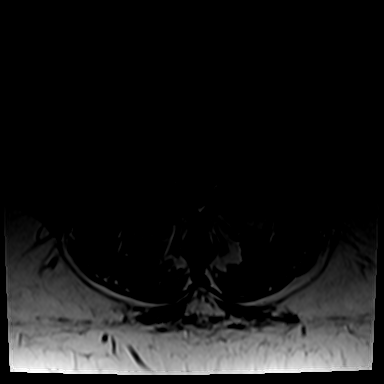
[im 13/31]
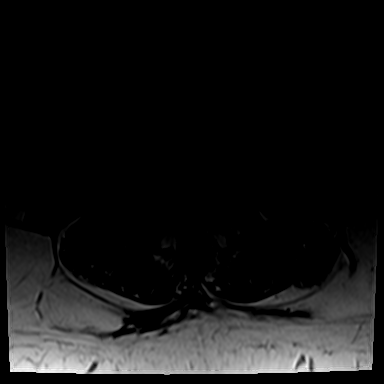
[im 16/31]
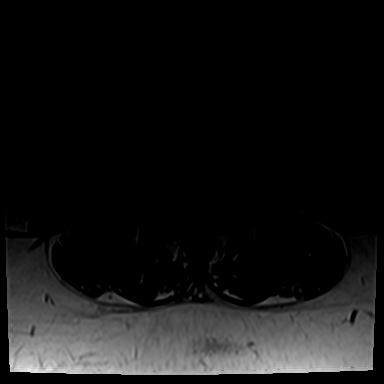
[im 19/31]
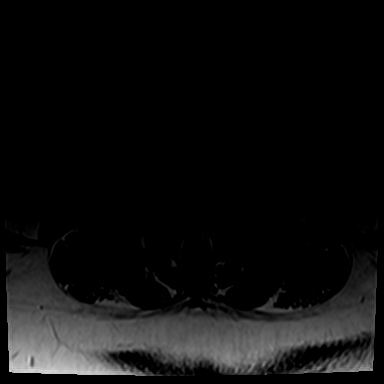
[im 22/31]
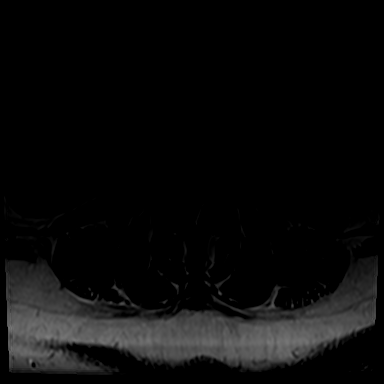
[im 25/31]
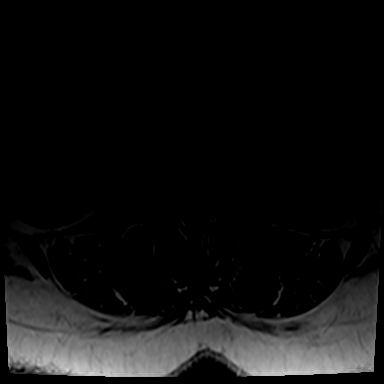
[im 28/31]
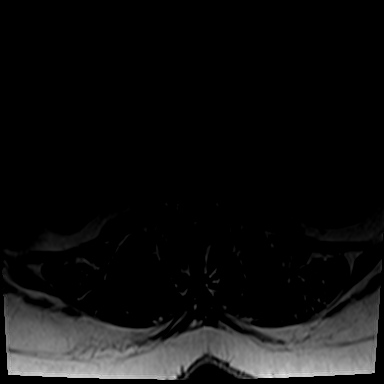
[im 31/31]
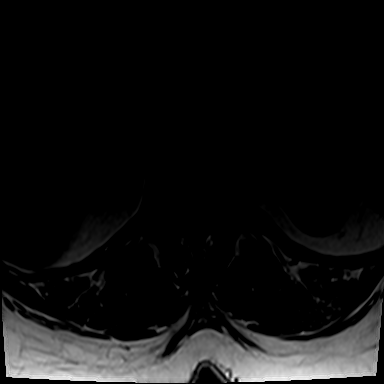

[Series 6: T2 · axial · 4.0mm · 0.82mm/px · z∈[-57,+116]mm · 6 of 16 slices shown]
[im 1/16]
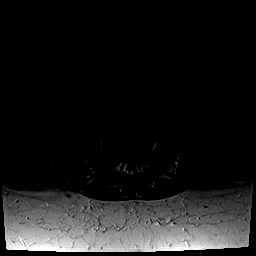
[im 4/16]
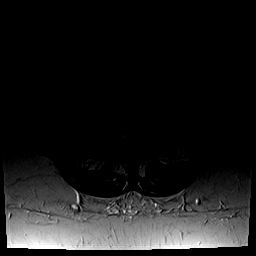
[im 7/16]
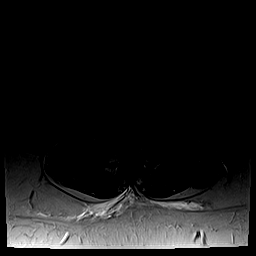
[im 10/16]
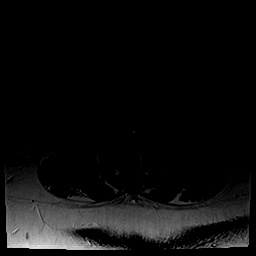
[im 13/16]
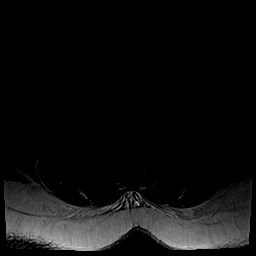
[im 16/16]
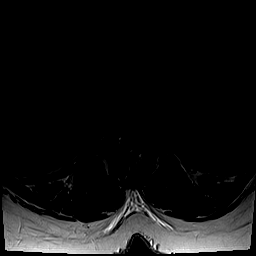

[Series 7: T2 post-contrast · sagittal · 4.0mm · 0.55mm/px · 5 of 13 slices shown]
[im 1/13]
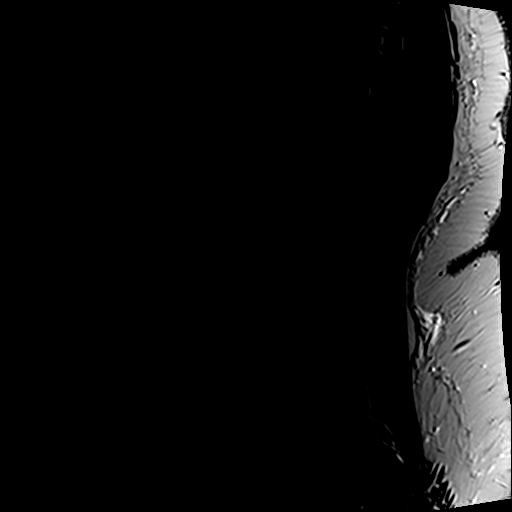
[im 4/13]
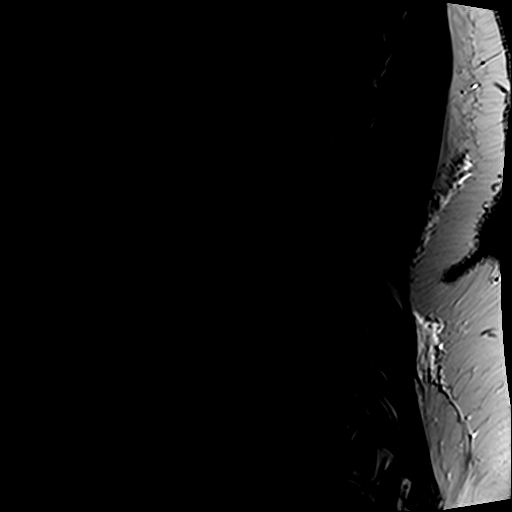
[im 7/13]
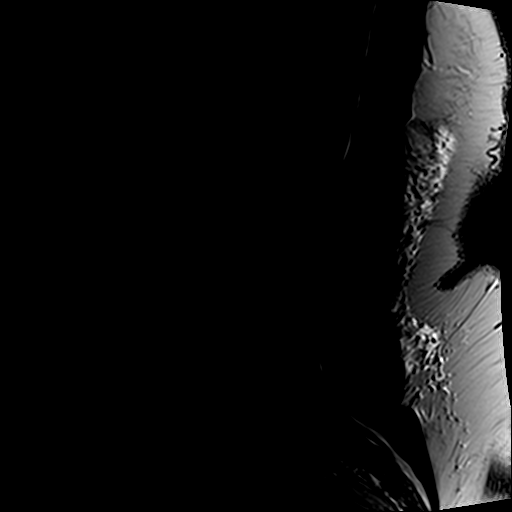
[im 10/13]
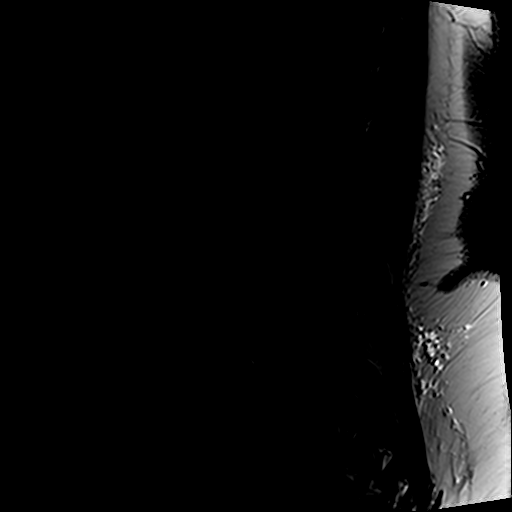
[im 13/13]
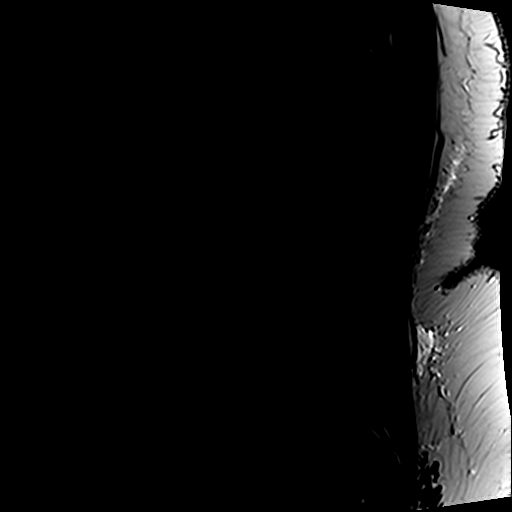

[Series 8: T1 fat-sat post-contrast · sagittal · 4.0mm · 0.88mm/px · 5 of 13 slices shown]
[im 1/13]
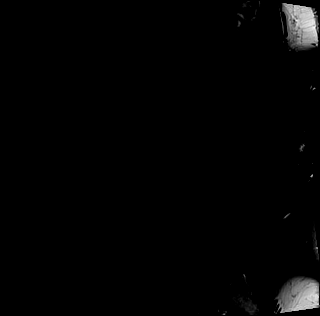
[im 4/13]
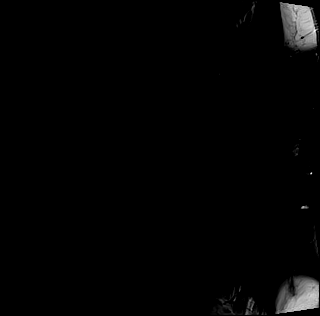
[im 7/13]
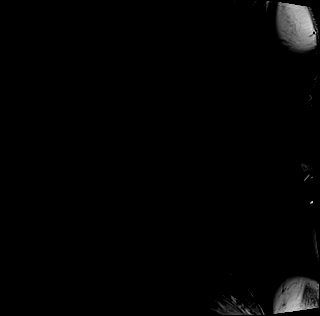
[im 10/13]
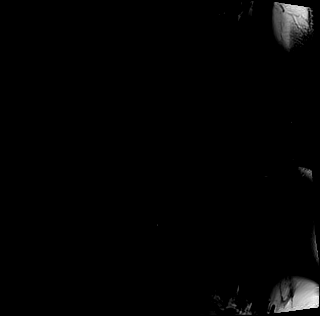
[im 13/13]
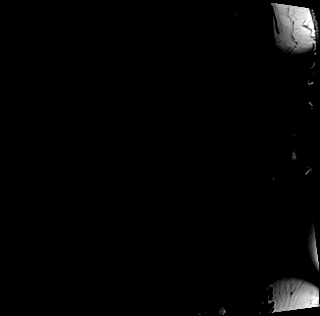

[32 of 48 positions shown; findings below may reference images not displayed]

FINDINGS: Segmentation:  Normal

Alignment:  Unchanged trace grade 1 anterolisthesis at L4-L5.

Vertebrae: Postsurgical changes at L4-L5 at the site of anterior
interbody fusion. No acute compression fracture,
discitis-osteomyelitis, facet edema or other focal marrow lesion. No
epidural collection.

Conus medullaris: Extends to the L1 level and appears normal.

Paraspinal and other soft tissues: The visualized aorta, IVC and
iliac vessels are normal. The visualized retroperitoneal organs and
paraspinal soft tissues are normal.

Disc levels:

T10-T11: No spinal canal or neural foraminal stenosis.  Normal disc.

T11-T12: Unchanged small disc herniation without spinal canal or
neural foraminal stenosis.

T12-L1: Normal disc space and facets. No spinal canal or
neuroforaminal stenosis.

L1-L2: Normal disc space and facets. No spinal canal or
neuroforaminal stenosis.

L2-L3: Disc desiccation minimal bulge. No spinal canal or neural
foraminal stenosis. Normal facets.

L3-L4: Disc desiccation with small bulge. Moderate narrowing of the
spinal canal. No neural foraminal stenosis.

L4-L5: Status post anterior fusion at L4-L5 with interbody spacer
without subsidence. Unchanged alignment. Spinal canal is patent.
Mild bilateral foraminal narrowing.

L5-S1: Minimal disc bulge. No spinal canal or neural foraminal
stenosis.

Visualized sacrum: Normal.
IMPRESSION: 1. Unchanged postoperative appearance of the L4-L5 level with
anterior and interbody fusion.
2. Mild progression of spinal canal stenosis at L3-L4, now moderate.
3. Otherwise unchanged lumbar spine examination.

## 2018-11-28 ENCOUNTER — Other Ambulatory Visit: Payer: Self-pay

## 2018-11-28 ENCOUNTER — Telehealth: Payer: Self-pay

## 2018-11-28 ENCOUNTER — Inpatient Hospital Stay: Payer: Medicare Other | Attending: Oncology

## 2018-11-28 DIAGNOSIS — I82403 Acute embolism and thrombosis of unspecified deep veins of lower extremity, bilateral: Secondary | ICD-10-CM

## 2018-11-28 DIAGNOSIS — Z86718 Personal history of other venous thrombosis and embolism: Secondary | ICD-10-CM | POA: Diagnosis not present

## 2018-11-28 DIAGNOSIS — D649 Anemia, unspecified: Secondary | ICD-10-CM

## 2018-11-28 DIAGNOSIS — Z7901 Long term (current) use of anticoagulants: Secondary | ICD-10-CM | POA: Diagnosis not present

## 2018-11-28 LAB — PROTIME-INR
INR: 2 — ABNORMAL HIGH (ref 0.8–1.2)
Prothrombin Time: 22.1 seconds — ABNORMAL HIGH (ref 11.4–15.2)

## 2018-11-28 LAB — CBC WITH DIFFERENTIAL (CANCER CENTER ONLY)
Abs Immature Granulocytes: 0.01 10*3/uL (ref 0.00–0.07)
Basophils Absolute: 0 10*3/uL (ref 0.0–0.1)
Basophils Relative: 0 %
Eosinophils Absolute: 0.2 10*3/uL (ref 0.0–0.5)
Eosinophils Relative: 3 %
HCT: 37.5 % (ref 36.0–46.0)
Hemoglobin: 11.8 g/dL — ABNORMAL LOW (ref 12.0–15.0)
Immature Granulocytes: 0 %
Lymphocytes Relative: 32 %
Lymphs Abs: 1.8 10*3/uL (ref 0.7–4.0)
MCH: 26.2 pg (ref 26.0–34.0)
MCHC: 31.5 g/dL (ref 30.0–36.0)
MCV: 83.1 fL (ref 80.0–100.0)
Monocytes Absolute: 0.4 10*3/uL (ref 0.1–1.0)
Monocytes Relative: 7 %
Neutro Abs: 3.1 10*3/uL (ref 1.7–7.7)
Neutrophils Relative %: 58 %
Platelet Count: 333 10*3/uL (ref 150–400)
RBC: 4.51 MIL/uL (ref 3.87–5.11)
RDW: 17.6 % — ABNORMAL HIGH (ref 11.5–15.5)
WBC Count: 5.5 10*3/uL (ref 4.0–10.5)
nRBC: 0 % (ref 0.0–0.2)

## 2018-11-28 NOTE — Telephone Encounter (Signed)
TC to patient per Lattie Haw to let her know Coumadin is in [place] range. Continue same dose of Coumadin. Repeat PT/INR in 1 month. Patient verbalized understanding no further problems or concerns at this time.

## 2018-12-19 ENCOUNTER — Ambulatory Visit
Admission: RE | Admit: 2018-12-19 | Discharge: 2018-12-19 | Disposition: A | Discharge status: Home / Self Care | Payer: Medicare Other | Source: Ambulatory Visit | Attending: Allergy and Immunology | Admitting: Allergy and Immunology

## 2018-12-19 ENCOUNTER — Other Ambulatory Visit: Payer: Self-pay | Admitting: Allergy and Immunology

## 2018-12-19 DIAGNOSIS — R062 Wheezing: Secondary | ICD-10-CM

## 2018-12-20 ENCOUNTER — Ambulatory Visit: Payer: Medicare Other | Admitting: Podiatry

## 2018-12-27 ENCOUNTER — Telehealth: Payer: Self-pay

## 2018-12-27 ENCOUNTER — Inpatient Hospital Stay: Payer: Medicare Other | Attending: Oncology

## 2018-12-27 ENCOUNTER — Other Ambulatory Visit: Payer: Self-pay

## 2018-12-27 DIAGNOSIS — Z7901 Long term (current) use of anticoagulants: Secondary | ICD-10-CM

## 2018-12-27 DIAGNOSIS — Z86718 Personal history of other venous thrombosis and embolism: Secondary | ICD-10-CM | POA: Insufficient documentation

## 2018-12-27 DIAGNOSIS — D649 Anemia, unspecified: Secondary | ICD-10-CM

## 2018-12-27 DIAGNOSIS — I82403 Acute embolism and thrombosis of unspecified deep veins of lower extremity, bilateral: Secondary | ICD-10-CM

## 2018-12-27 LAB — PROTIME-INR
INR: 1.9 — ABNORMAL HIGH (ref 0.8–1.2)
Prothrombin Time: 21.3 seconds — ABNORMAL HIGH (ref 11.4–15.2)

## 2018-12-27 NOTE — Telephone Encounter (Signed)
-----   Message from Owens Shark, NP sent at 12/27/2018  8:47 AM EDT ----- Please let her know the PT/INR looks good.  She should continue the same dose of Coumadin.  Next PT/INR in 1 month.

## 2018-12-27 NOTE — Telephone Encounter (Signed)
Called patient and informed her of information in message from Ned Card, NP below. Patient verbalized understanding.

## 2018-12-30 IMAGING — CT CT RENAL STONE PROTOCOL
2 of 4 series · 17 of 46 positions shown, 19 images · non-contrast
Comparison: CT abdomen pelvis 12/03/2015

CLINICAL DATA: Patient with right flank pain.

EXAM:
CT ABDOMEN AND PELVIS WITHOUT CONTRAST
TECHNIQUE: Multidetector CT imaging of the abdomen and pelvis was performed
following the standard protocol without IV contrast.

[Series 3: renal stone 5.0 · axial · 0.98mm/px · z∈[+1096,+1516]mm · 14 of 92 slices shown, 16 images]
[im 4/92  soft-tissue]
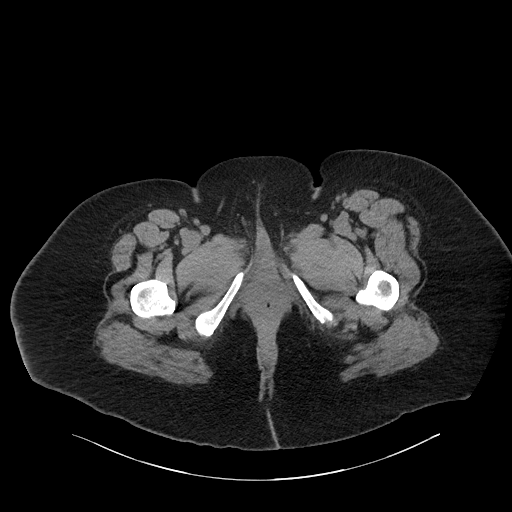
[im 4/92  bone]
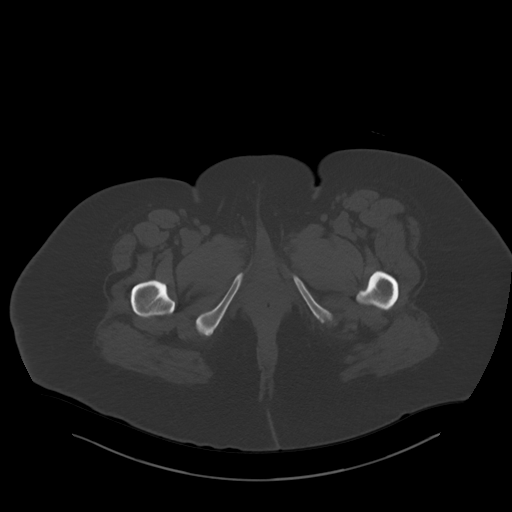
[im 12/92  soft-tissue]
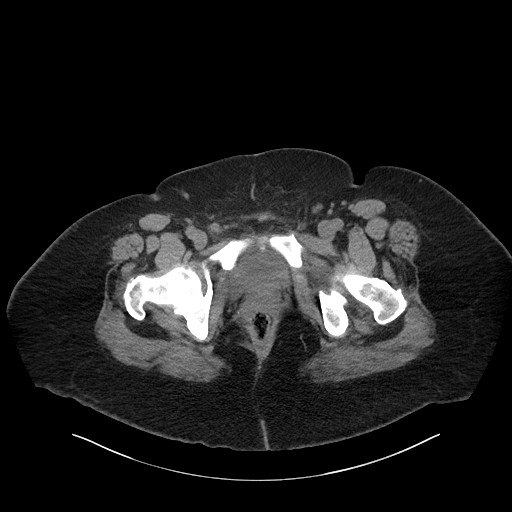
[im 19/92  soft-tissue]
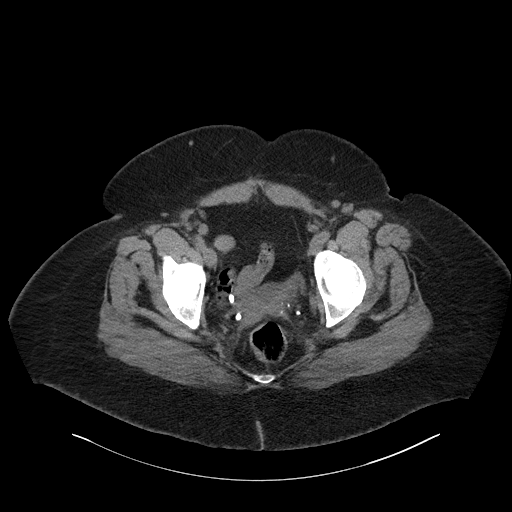
[im 23/92  soft-tissue]
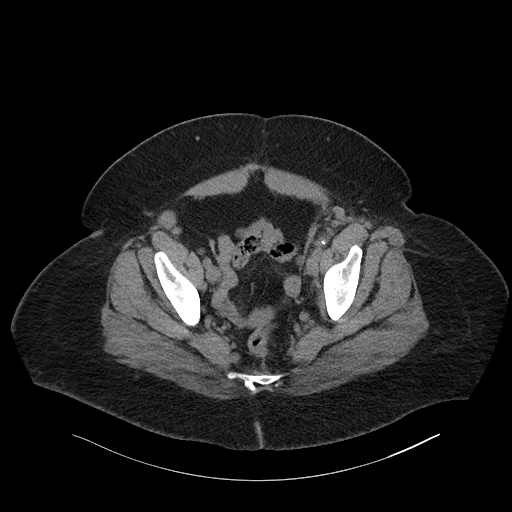
[im 31/92  soft-tissue]
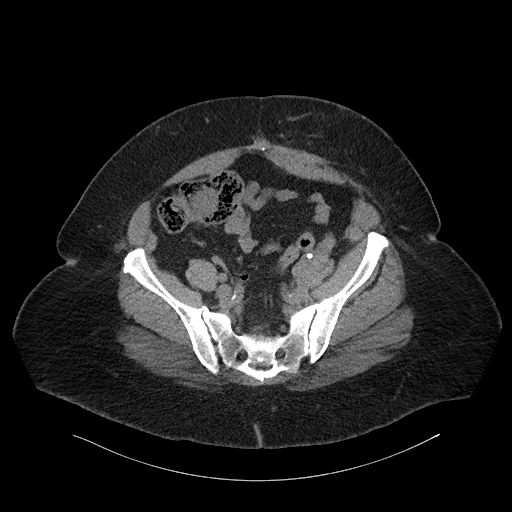
[im 38/92  soft-tissue]
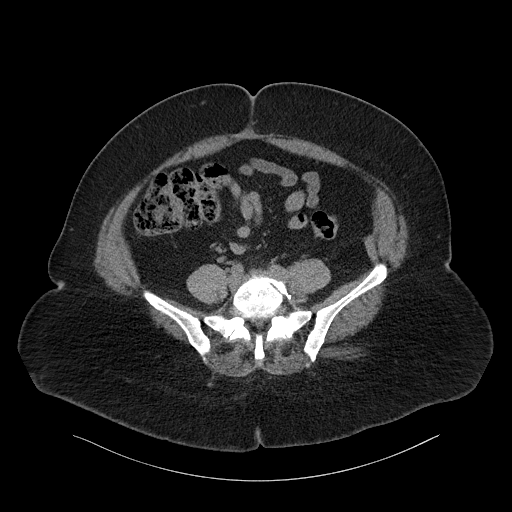
[im 42/92  soft-tissue]
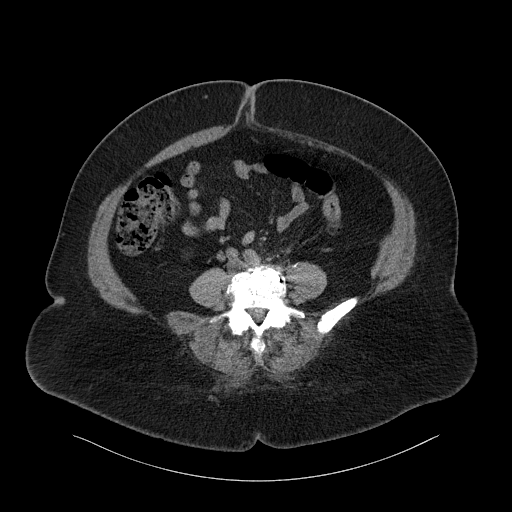
[im 50/92  soft-tissue]
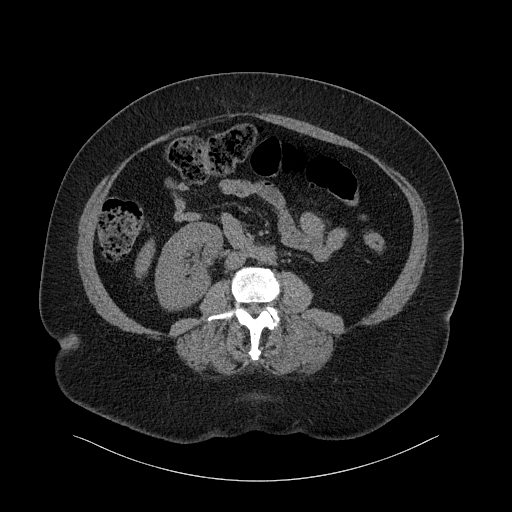
[im 54/92  soft-tissue]
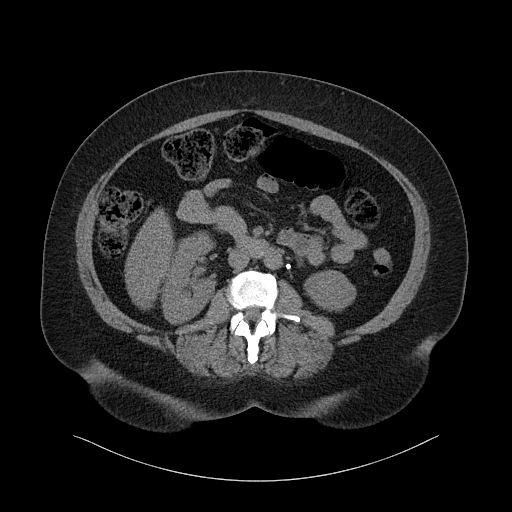
[im 54/92  bone]
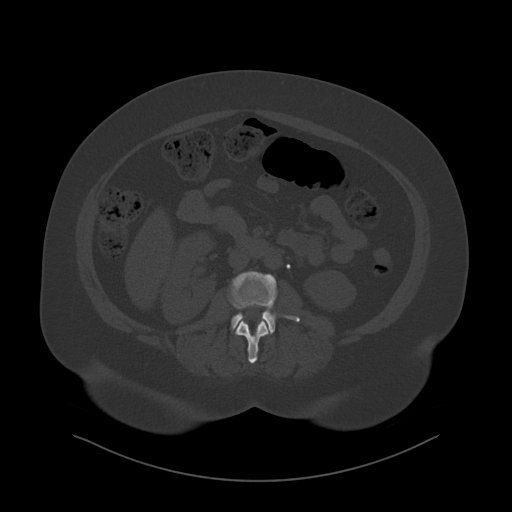
[im 61/92  soft-tissue]
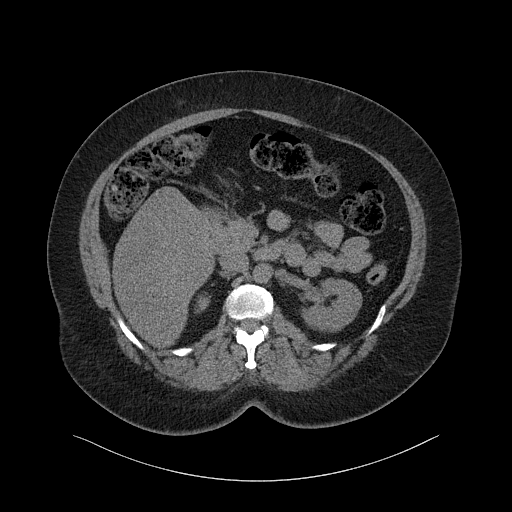
[im 69/92  soft-tissue]
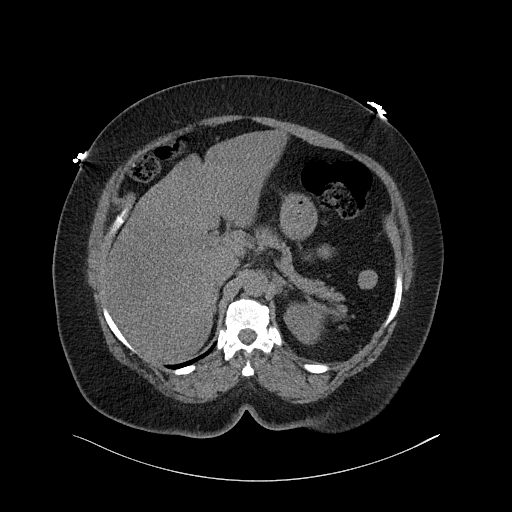
[im 73/92  soft-tissue]
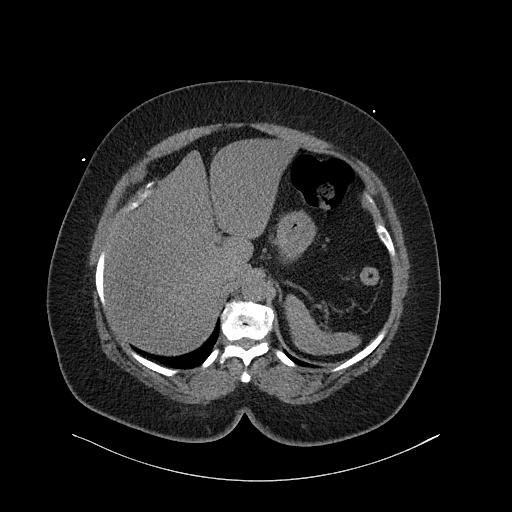
[im 80/92  soft-tissue]
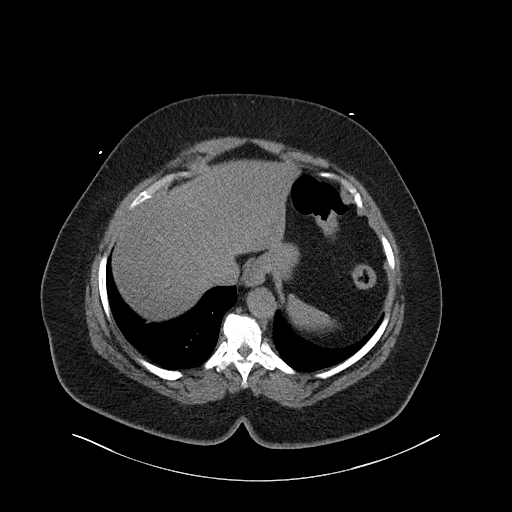
[im 88/92  soft-tissue]
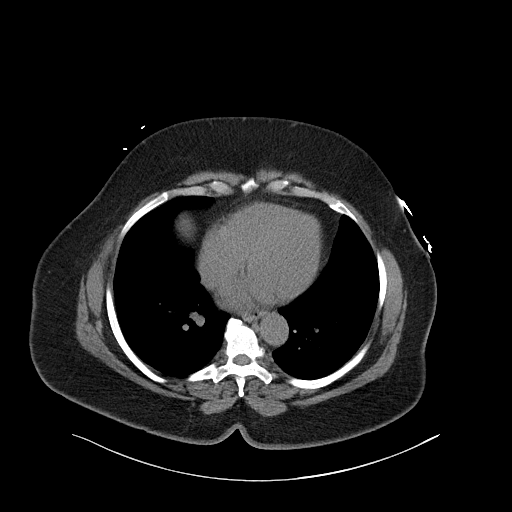

[Series 5: renal stone 3.0 cor · coronal · 0.96mm/px · 3 of 141 slices shown]
[im 47/141  soft-tissue]
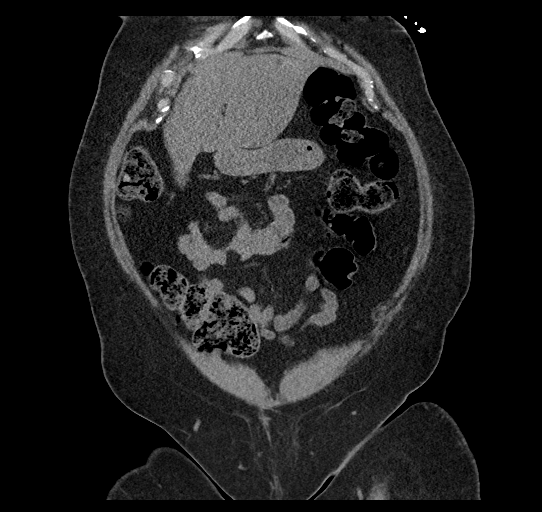
[im 63/141  soft-tissue]
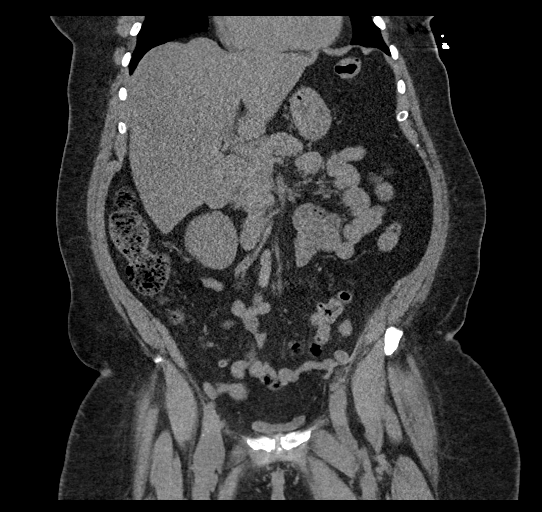
[im 78/141  soft-tissue]
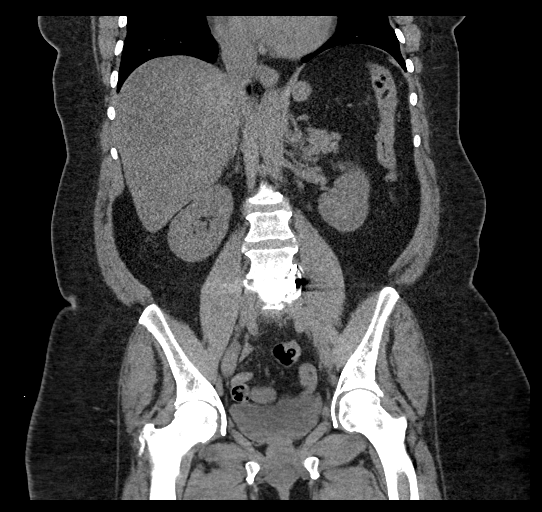

[17 of 46 positions shown; findings below may reference images not displayed]

FINDINGS: Lower chest: Normal heart size. Lung bases are clear. No pleural
effusion.

Hepatobiliary: Liver is low in attenuation compatible with
steatosis. Patient status post cholecystectomy.

Pancreas: Unremarkable

Spleen: Unremarkable

Adrenals/Urinary Tract: Left adrenal gland is normal. Unchanged
cm right adrenal lesion, stable dating back to 4864 kidneys are
symmetric in size. No hydronephrosis. No definite ureterolithiasis.
Multiple calcific densities within the pelvis favored represent
phleboliths.

Stomach/Bowel: No abnormal bowel wall thickening or evidence for
bowel obstruction. No free fluid or free intraperitoneal air. Normal
appendix. Small hiatal hernia.

Vascular/Lymphatic: Normal caliber abdominal aorta. No
retroperitoneal lymphadenopathy.

Reproductive: Status post hysterectomy. Adnexal structures are
unremarkable.

Other: None.

Musculoskeletal: Lower thoracic and lumbar spine degenerative
changes. No aggressive or acute appearing osseous lesions.
IMPRESSION: No nephroureterolithiasis.  No hydronephrosis.

Hepatic steatosis.

## 2019-01-06 ENCOUNTER — Ambulatory Visit
Admission: RE | Admit: 2019-01-06 | Discharge: 2019-01-06 | Disposition: A | Discharge status: Home / Self Care | Payer: Medicare Other | Source: Ambulatory Visit | Attending: Family | Admitting: Family

## 2019-01-06 ENCOUNTER — Other Ambulatory Visit: Payer: Self-pay | Admitting: Family Medicine

## 2019-01-06 DIAGNOSIS — R109 Unspecified abdominal pain: Secondary | ICD-10-CM

## 2019-01-06 DIAGNOSIS — K5900 Constipation, unspecified: Secondary | ICD-10-CM

## 2019-01-27 ENCOUNTER — Inpatient Hospital Stay: Payer: Medicare Other | Attending: Oncology

## 2019-01-27 ENCOUNTER — Other Ambulatory Visit: Payer: Self-pay

## 2019-01-27 DIAGNOSIS — Z86718 Personal history of other venous thrombosis and embolism: Secondary | ICD-10-CM | POA: Diagnosis not present

## 2019-01-27 DIAGNOSIS — D649 Anemia, unspecified: Secondary | ICD-10-CM

## 2019-01-27 DIAGNOSIS — Z7901 Long term (current) use of anticoagulants: Secondary | ICD-10-CM

## 2019-01-27 DIAGNOSIS — I82403 Acute embolism and thrombosis of unspecified deep veins of lower extremity, bilateral: Secondary | ICD-10-CM

## 2019-01-27 LAB — PROTIME-INR
INR: 1.7 — ABNORMAL HIGH (ref 0.8–1.2)
Prothrombin Time: 19.7 seconds — ABNORMAL HIGH (ref 11.4–15.2)

## 2019-01-28 ENCOUNTER — Telehealth: Payer: Self-pay | Admitting: *Deleted

## 2019-01-28 NOTE — Telephone Encounter (Signed)
Per Ned Card, NP, called and made pt aware to continue same dose of Coumadin and to f/u PT/INR in 1 month. Pt verbalized understanding and had no other questions at this time.

## 2019-01-28 NOTE — Telephone Encounter (Signed)
-----   Message from Owens Shark, NP sent at 01/28/2019  8:44 AM EST ----- Continue same dose of Coumadin.  Follow-up PT/INR in 1 month.

## 2019-02-10 ENCOUNTER — Other Ambulatory Visit: Payer: Self-pay | Admitting: Family Medicine

## 2019-02-12 ENCOUNTER — Ambulatory Visit (INDEPENDENT_AMBULATORY_CARE_PROVIDER_SITE_OTHER): Payer: Medicare Other | Admitting: Orthopedic Surgery

## 2019-02-12 ENCOUNTER — Ambulatory Visit: Payer: Self-pay

## 2019-02-12 ENCOUNTER — Other Ambulatory Visit: Payer: Self-pay

## 2019-02-12 DIAGNOSIS — Z96653 Presence of artificial knee joint, bilateral: Secondary | ICD-10-CM

## 2019-02-12 DIAGNOSIS — M5410 Radiculopathy, site unspecified: Secondary | ICD-10-CM

## 2019-02-12 DIAGNOSIS — M25561 Pain in right knee: Secondary | ICD-10-CM | POA: Diagnosis not present

## 2019-02-12 DIAGNOSIS — M25562 Pain in left knee: Secondary | ICD-10-CM | POA: Diagnosis not present

## 2019-02-12 MED ORDER — TRAMADOL HCL 50 MG PO TABS: 50.0000 mg | ORAL_TABLET | Freq: Two times a day (BID) | ORAL | 0 refills | Status: DC | PRN

## 2019-02-14 ENCOUNTER — Encounter: Payer: Self-pay | Admitting: Orthopedic Surgery

## 2019-02-14 NOTE — Progress Notes (Signed)
Office Visit Note   Patient: [name]           Date of Birth: 12-15-1955           MRN: [id number] Visit Date: 02/12/2019 Requested by: Loretha Brasil, [place],  [place] [address] PCP: Loretha Brasil, FNP  Subjective: Chief Complaint  Patient presents with  . Right Knee - Pain  . Left Knee - Pain    HPI: [name] is a 63 y.o. female who presents to the office complaining of bilateral knee pain.  Patient notes that her bilateral knee pain is been ongoing for several months.  She has tried taking tramadol without any relief.  She denies any acute injury leading to her pain onset.  She has a history of bilateral knee total knee arthroplasties.  She denies any fevers, chills.  Notes pain is worse with ambulation and worse in the right knee than the left knee.  She also notes right groin pain and right back pain with radicular pain that begins in her back and travels to her groin, lateral thigh, lateral calf.  She also notes numbness and tingling down the right leg.  Her symptoms wake her up from sleep.  She notes subjective weakness of the right leg.  She has a history of back surgery which she reports was a 1 level decompression and fusion done by Dr. Cyndy Freeze.  She has had ESI's in the past but none recently and she denies any recent MRI.  She had an MRI of her hip in 2016.  No operative pathology in the hip at that time.  The last MRI of the L-spine was 2015 that showed an L4-5 fusion.  Patient states that she is on blood thinners.                ROS:  All systems reviewed are negative as they relate to the chief complaint within the history of present illness.  Patient denies fevers or chills.  Assessment & Plan: Visit Diagnoses:  1. Radicular pain of right lower extremity   2. Pain in both knees, unspecified chronicity   3. History of bilateral knee replacement     Plan: Patient is a 63 year old female who presents complaining of bilateral  knee pain.  Knee pain is been ongoing for several months and she notes that pain is worse in the right knee.  She has associated radicular symptoms with numbness and tingling down the right lower extremity.  Bilateral knee x-rays taken today in the clinic and negative for any acute findings.  Impression is her pain is likely stemming from pathology in her spine is causing radicular symptoms down her leg.  Ordered MRI of the lumbar spine to evaluate for right radiculopathy.  Also prescribed tramadol for patient's pain.  Patient will follow-up after MRI to review results.  Not a great candidate for any type of surgical intervention or injections but we will at least see if she has some adjacent segment disease.  Follow-Up Instructions: No follow-ups on file.   Orders:  Orders Placed This Encounter  Procedures  . XR KNEE 3 VIEW RIGHT  . MR Lumbar Spine w/o contrast   Meds ordered this encounter  Medications  . traMADol (ULTRAM) 50 MG tablet    Sig: Take 1 tablet (50 mg total) by mouth every 12 (twelve) hours as needed.    Dispense:  40 tablet    Refill:  0  Procedures: No procedures performed   Clinical Data: No additional findings.  Objective: Vital Signs: There were no vitals taken for this visit.  Physical Exam:  Constitutional: Patient appears well-developed HEENT:  Head: Normocephalic Eyes:EOM are normal Neck: Normal range of motion Cardiovascular: Normal rate Pulmonary/chest: Effort normal Neurologic: Patient is alert Skin: Skin is warm Psychiatric: Patient has normal mood and affect  Ortho Exam:  Bilateral knee Exam Extensor mechanism intact bilaterally No TTP over the quad tendon, patellar tendon, pes anserinus, patella, tibial tubercle, LCL/MCL insertions TTP over the medial and lateral joint lines bilaterally Stable to varus/valgus stresses.  Stable to anterior/posterior drawer Extension to 0 degrees Flexion > 90 degrees No significant groin pain elicited  with internal rotation of bilateral hip joints Reduced right hip flexor strength compared with contralateral side.  Equivalent quadricep, hamstring, dorsiflexion, plantarflexion strength bilaterally.  Negative straight leg raise bilaterally.  Specialty Comments:  No specialty comments available.  Imaging: No results found.   PMFS History: Patient Active Problem List   Diagnosis Date Noted  . Thyroid mass 02/27/2017  . Chronic pain of right knee 05/24/2016  . Effusion, right knee 04/28/2016  . Presence of right artificial knee joint 04/28/2016  . Abnormal LFTs   . Decreased sensation   . Paresthesia 06/12/2014  . Obstruction of kidney 04/24/2014  . Diabetes type 2, uncontrolled ([place]) 04/24/2014  . Chronic anticoagulation 04/24/2014  . Crack cocaine use 04/24/2014  . Depression 04/24/2014  . Fibromyalgia 04/24/2014  . COPD (chronic obstructive pulmonary disease) ([place]) 04/24/2014  . Obstructive sleep apnea on CPAP 04/24/2014  . Lupus anticoagulant positive 04/24/2014  . Adrenal mass, right ([place]) 04/24/2014  . Right kidney mass 04/24/2014  . Hydronephrosis of right kidney 04/24/2014  . Supratherapeutic INR 04/24/2014  . Renal mass, right 04/24/2014  . Diabetes type 2, controlled ([place])   . Right lower quadrant abdominal pain   . Morbid obesity ([facility] [place] [facility]) 08/21/2013  . Unspecified constipation 08/19/2013  . COPD exacerbation ([place]) 08/17/2013  . Morbid obesity with BMI of 45.0-49.9, adult ([place]) 01/05/2012  . Bilateral deep vein thromboses ([place]) 12/13/2011  . Abdominal pain 11/05/2011  . Hypokalemia 11/05/2011  . Sciatica 11/05/2011  . Type II or unspecified type diabetes mellitus without mention of complication, not stated as uncontrolled 11/05/2011  . HTN (hypertension) 11/05/2011   Past Medical History:  Diagnosis Date  . Adrenal mass ([facility]) 03/226   Benign  . Arthritis    knees/multiple orthopedic conditons; lower back  . Asthma    per pt  . Clotting disorder ([facility])     +beta-2-glycoprotein IgA antibody  . 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 Depression   . Diabetes mellitus    120s usually fasting -  dx more than 10 yrs ago  . Dizziness, nonspecific   . DVT (deep venous thrombosis) ([facility])    Recurrent  . Fibromyalgia   . GERD (gastroesophageal reflux disease)   . Heart murmur   . History of blood transfusion   . History of cocaine abuse ([place])    Remote history   . Hypertension    takes meds daily  . Hypothyroidism   . Lupus Anticoagulant Positive   . On home oxygen therapy    at night  . Shortness of breath    exertion or lying flat  . Sleep apnea    2l of oxygen at night (as of 12/6, she used to)    Family History  Problem Relation  Age of Onset  . Hypertension Mother   . Diabetes Mother   . Cancer Mother        Pancreatic  . Hypertension Father   . Heart failure Father     Past Surgical History:  Procedure Laterality Date  . ABDOMINAL HYSTERECTOMY  1989   Fibroids  . ANTERIOR LUMBAR FUSION  01/03/2012   Procedure: ANTERIOR LUMBAR FUSION 1 LEVEL;  Surgeon: Winfield Cunas, MD;  Location: [facility];  Service: Neurosurgery;  Laterality: N/A;  Lumbar Four-Five Anterior Lumbar Interbody Fusion with Instrumentation  . BACK SURGERY     cervical spine---disk disease  . CARPAL TUNNEL RELEASE     Bilateral  . CESAREAN SECTION    . CHOLECYSTECTOMY    . CYSTOSCOPY/RETROGRADE/URETEROSCOPY/STONE EXTRACTION WITH BASKET Right 04/26/2014   Procedure: CYSTOSCOPY/ RIGHT RETROGRADE/ RIGHT URETEROSCOPY/URETERAL AND RENAL PELVIS BIOPSY;  Surgeon: Alexis Frock, MD;  Location: [facility];  Service: Urology;  Laterality: Right;  . gallstones removed    . 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   bilateral  . right elbow surgery    . THYROID LOBECTOMY Right 02/27/2017  . THYROID LOBECTOMY Right 02/27/2017   Procedure: RIGHT THYROID LOBECTOMY;  Surgeon: Erroll Luna, MD;  Location: [facility];  Service:  General;  Laterality: Right;  . TUBAL LIGATION     Social History   Occupational History  . Not on file  Tobacco Use  . Smoking status: Former Smoker    Start date: 03/20/2001    Quit date: 03/21/2003    Years since quitting: 15.9  . Smokeless tobacco: Never Used  . Tobacco comment: smoked for 2 years  Substance and Sexual Activity  . Alcohol use: No    Alcohol/week: 0.0 standard drinks    Comment: beer in past   . Drug use: Yes    Types: Cocaine    Comment: quit 2003-rehab progam in 2005  . Sexual activity: Never

## 2019-02-16 ENCOUNTER — Encounter: Payer: Self-pay | Admitting: Orthopedic Surgery

## 2019-02-19 ENCOUNTER — Emergency Department (HOSPITAL_COMMUNITY): Payer: Medicare Other

## 2019-02-19 ENCOUNTER — Encounter (HOSPITAL_COMMUNITY): Payer: Self-pay | Admitting: Family Medicine

## 2019-02-19 ENCOUNTER — Observation Stay (HOSPITAL_COMMUNITY)
Admission: EM | Admit: 2019-02-19 | Discharge: 2019-02-20 | Disposition: A | Discharge status: Home / Self Care | Payer: Medicare Other | Attending: Internal Medicine | Admitting: Internal Medicine

## 2019-02-19 DIAGNOSIS — Z8249 Family history of ischemic heart disease and other diseases of the circulatory system: Secondary | ICD-10-CM | POA: Diagnosis not present

## 2019-02-19 DIAGNOSIS — Z96653 Presence of artificial knee joint, bilateral: Secondary | ICD-10-CM | POA: Insufficient documentation

## 2019-02-19 DIAGNOSIS — M47816 Spondylosis without myelopathy or radiculopathy, lumbar region: Secondary | ICD-10-CM | POA: Diagnosis not present

## 2019-02-19 DIAGNOSIS — M6281 Muscle weakness (generalized): Principal | ICD-10-CM | POA: Insufficient documentation

## 2019-02-19 DIAGNOSIS — Z7984 Long term (current) use of oral hypoglycemic drugs: Secondary | ICD-10-CM | POA: Diagnosis not present

## 2019-02-19 DIAGNOSIS — Z833 Family history of diabetes mellitus: Secondary | ICD-10-CM | POA: Insufficient documentation

## 2019-02-19 DIAGNOSIS — M170 Bilateral primary osteoarthritis of knee: Secondary | ICD-10-CM | POA: Diagnosis not present

## 2019-02-19 DIAGNOSIS — Z7901 Long term (current) use of anticoagulants: Secondary | ICD-10-CM | POA: Diagnosis not present

## 2019-02-19 DIAGNOSIS — D6862 Lupus anticoagulant syndrome: Secondary | ICD-10-CM | POA: Diagnosis not present

## 2019-02-19 DIAGNOSIS — Z6841 Body Mass Index (BMI) 40.0 and over, adult: Secondary | ICD-10-CM | POA: Insufficient documentation

## 2019-02-19 DIAGNOSIS — E1169 Type 2 diabetes mellitus with other specified complication: Secondary | ICD-10-CM | POA: Diagnosis present

## 2019-02-19 DIAGNOSIS — G4733 Obstructive sleep apnea (adult) (pediatric): Secondary | ICD-10-CM | POA: Diagnosis not present

## 2019-02-19 DIAGNOSIS — E119 Type 2 diabetes mellitus without complications: Secondary | ICD-10-CM | POA: Diagnosis not present

## 2019-02-19 DIAGNOSIS — I10 Essential (primary) hypertension: Secondary | ICD-10-CM | POA: Insufficient documentation

## 2019-02-19 DIAGNOSIS — M797 Fibromyalgia: Secondary | ICD-10-CM | POA: Insufficient documentation

## 2019-02-19 DIAGNOSIS — E039 Hypothyroidism, unspecified: Secondary | ICD-10-CM | POA: Diagnosis not present

## 2019-02-19 DIAGNOSIS — Z87891 Personal history of nicotine dependence: Secondary | ICD-10-CM | POA: Diagnosis not present

## 2019-02-19 DIAGNOSIS — J449 Chronic obstructive pulmonary disease, unspecified: Secondary | ICD-10-CM | POA: Diagnosis present

## 2019-02-19 DIAGNOSIS — Z888 Allergy status to other drugs, medicaments and biological substances status: Secondary | ICD-10-CM | POA: Insufficient documentation

## 2019-02-19 DIAGNOSIS — Z86718 Personal history of other venous thrombosis and embolism: Secondary | ICD-10-CM | POA: Insufficient documentation

## 2019-02-19 DIAGNOSIS — E669 Obesity, unspecified: Secondary | ICD-10-CM | POA: Diagnosis present

## 2019-02-19 DIAGNOSIS — K219 Gastro-esophageal reflux disease without esophagitis: Secondary | ICD-10-CM | POA: Insufficient documentation

## 2019-02-19 DIAGNOSIS — F329 Major depressive disorder, single episode, unspecified: Secondary | ICD-10-CM | POA: Diagnosis not present

## 2019-02-19 DIAGNOSIS — R531 Weakness: Secondary | ICD-10-CM | POA: Diagnosis present

## 2019-02-19 DIAGNOSIS — I6782 Cerebral ischemia: Secondary | ICD-10-CM | POA: Insufficient documentation

## 2019-02-19 DIAGNOSIS — Z9981 Dependence on supplemental oxygen: Secondary | ICD-10-CM | POA: Insufficient documentation

## 2019-02-19 DIAGNOSIS — Z79899 Other long term (current) drug therapy: Secondary | ICD-10-CM | POA: Diagnosis not present

## 2019-02-19 LAB — URINALYSIS, ROUTINE W REFLEX MICROSCOPIC
Bilirubin Urine: NEGATIVE
Glucose, UA: >=500 mg/dL — AB
Hgb urine dipstick: NEGATIVE
Ketones, ur: NEGATIVE mg/dL
Leukocytes,Ua: NEGATIVE
Nitrite: NEGATIVE
Protein, ur: NEGATIVE mg/dL
Specific Gravity, Urine: 1.021 (ref 1.005–1.030)
pH: 5 (ref 5.0–8.0)

## 2019-02-19 LAB — CBC WITH DIFFERENTIAL/PLATELET
Abs Immature Granulocytes: 0.02 10*3/uL (ref 0.00–0.07)
Basophils Absolute: 0 10*3/uL (ref 0.0–0.1)
Basophils Relative: 0 %
Eosinophils Absolute: 0.1 10*3/uL (ref 0.0–0.5)
Eosinophils Relative: 1 %
HCT: 38.6 % (ref 36.0–46.0)
Hemoglobin: 11.9 g/dL — ABNORMAL LOW (ref 12.0–15.0)
Immature Granulocytes: 0 %
Lymphocytes Relative: 28 %
Lymphs Abs: 2.1 10*3/uL (ref 0.7–4.0)
MCH: 26.6 pg (ref 26.0–34.0)
MCHC: 30.8 g/dL (ref 30.0–36.0)
MCV: 86.2 fL (ref 80.0–100.0)
Monocytes Absolute: 0.7 10*3/uL (ref 0.1–1.0)
Monocytes Relative: 9 %
Neutro Abs: 4.6 10*3/uL (ref 1.7–7.7)
Neutrophils Relative %: 62 %
Platelets: 357 10*3/uL (ref 150–400)
RBC: 4.48 MIL/uL (ref 3.87–5.11)
RDW: 19.4 % — ABNORMAL HIGH (ref 11.5–15.5)
WBC: 7.6 10*3/uL (ref 4.0–10.5)
nRBC: 0 % (ref 0.0–0.2)

## 2019-02-19 LAB — COMPREHENSIVE METABOLIC PANEL
ALT: 26 U/L (ref 0–44)
AST: 26 U/L (ref 15–41)
Albumin: 4 g/dL (ref 3.5–5.0)
Alkaline Phosphatase: 87 U/L (ref 38–126)
Anion gap: 11 (ref 5–15)
BUN: 14 mg/dL (ref 8–23)
CO2: 25 mmol/L (ref 22–32)
Calcium: 8.9 mg/dL (ref 8.9–10.3)
Chloride: 107 mmol/L (ref 98–111)
Creatinine, Ser: 0.91 mg/dL (ref 0.44–1.00)
GFR calc Af Amer: >60 mL/min (ref >60)
GFR calc non Af Amer: >60 mL/min (ref >60)
Glucose, Bld: 175 mg/dL — ABNORMAL HIGH (ref 70–99)
Potassium: 3.5 mmol/L (ref 3.5–5.1)
Sodium: 143 mmol/L (ref 135–145)
Total Bilirubin: 0.2 mg/dL — ABNORMAL LOW (ref 0.3–1.2)
Total Protein: 8.5 g/dL — ABNORMAL HIGH (ref 6.5–8.1)

## 2019-02-19 NOTE — ED Provider Notes (Signed)
[facility] DEPT Provider Note   CSN: [id number] Arrival date & time: 02/19/19  1524     History   Chief Complaint Chief Complaint  Patient presents with  . Weakness    HPI [name] is a 63 y.o. female.     Patient complains of progressive weakness over the last 3 weeks of her right arm and right leg.  Patient states that she has difficulty walking now and is coming close to falling.  Also she states when she is driving a car her right arm would not work the steering well  The history is provided by the patient. No language interpreter was used.  Weakness Severity:  Moderate Onset quality:  Sudden Duration: 3 weeks. Timing:  Constant Progression:  Worsening Chronicity:  New Context: not alcohol use   Relieved by:  Nothing Worsened by:  Nothing Ineffective treatments:  None tried Associated symptoms: no abdominal pain, no chest pain, no cough, no diarrhea, no frequency, no headaches and no seizures     Past Medical History:  Diagnosis Date  . Adrenal mass ([place]) 03/226   Benign  . Arthritis    knees/multiple orthopedic conditons; lower back  . Asthma    per pt  . 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 Depression   . Diabetes mellitus    120s usually fasting -  dx more than 10 yrs ago  . Dizziness, nonspecific   . DVT (deep venous thrombosis) (HCC)    Recurrent  . Fibromyalgia   . GERD (gastroesophageal reflux disease)   . Heart murmur   . History of blood transfusion   . History of cocaine abuse ([place])    Remote history   . Hypertension    takes meds daily  . Hypothyroidism   . Lupus Anticoagulant Positive   . On home oxygen therapy    at night  . Shortness of breath    exertion or lying flat  . Sleep apnea    2l of oxygen at night (as of 12/6, she used to)    Patient Active Problem List   Diagnosis  Date Noted  . Thyroid mass 02/27/2017  . Chronic pain of right knee 05/24/2016  . Effusion, right knee 04/28/2016  . Presence of right artificial knee joint 04/28/2016  . Abnormal LFTs   . Decreased sensation   . Paresthesia 06/12/2014  . Obstruction of kidney 04/24/2014  . Diabetes type 2, uncontrolled ([place]) 04/24/2014  . Chronic anticoagulation 04/24/2014  . Crack cocaine use 04/24/2014  . Depression 04/24/2014  . Fibromyalgia 04/24/2014  . COPD (chronic obstructive pulmonary disease) ([place]) 04/24/2014  . Obstructive sleep apnea on CPAP 04/24/2014  . Lupus anticoagulant positive 04/24/2014  . Adrenal mass, right ([place]) 04/24/2014  . Right kidney mass 04/24/2014  . Hydronephrosis of right kidney 04/24/2014  . Supratherapeutic INR 04/24/2014  . Renal mass, right 04/24/2014  . Diabetes type 2, controlled ([place])   . Right lower quadrant abdominal pain   . Morbid obesity ([place]) 08/21/2013  . Unspecified constipation 08/19/2013  . COPD exacerbation ([place]) 08/17/2013  . Morbid obesity with BMI of 45.0-49.9, adult ([place]) 01/05/2012  . Bilateral deep vein thromboses ([place]) 12/13/2011  . Abdominal pain 11/05/2011  . Hypokalemia 11/05/2011  . Sciatica 11/05/2011  . Type II or unspecified type diabetes mellitus without mention of complication, not stated as uncontrolled 11/05/2011  .  HTN (hypertension) 11/05/2011    Past Surgical History:  Procedure Laterality Date  . ABDOMINAL HYSTERECTOMY  1989   Fibroids  . ANTERIOR LUMBAR FUSION  01/03/2012   Procedure: ANTERIOR LUMBAR FUSION 1 LEVEL;  Surgeon: Winfield Cunas, MD;  Location: [facility];  Service: Neurosurgery;  Laterality: N/A;  Lumbar Four-Five Anterior Lumbar Interbody Fusion with Instrumentation  . BACK SURGERY     cervical spine---disk disease  . CARPAL TUNNEL RELEASE     Bilateral  . CESAREAN SECTION    . CHOLECYSTECTOMY    . CYSTOSCOPY/RETROGRADE/URETEROSCOPY/STONE EXTRACTION WITH BASKET Right 04/26/2014   Procedure:  CYSTOSCOPY/ RIGHT RETROGRADE/ RIGHT URETEROSCOPY/URETERAL AND RENAL PELVIS BIOPSY;  Surgeon: Alexis Frock, MD;  Location: [facility];  Service: Urology;  Laterality: Right;  . gallstones removed    . 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   bilateral  . right elbow surgery    . THYROID LOBECTOMY Right 02/27/2017  . THYROID LOBECTOMY Right 02/27/2017   Procedure: RIGHT THYROID LOBECTOMY;  Surgeon: Erroll Luna, MD;  Location: [facility];  Service: General;  Laterality: Right;  . TUBAL LIGATION       OB History   No obstetric history on file.      Home Medications    Prior to Admission medications   Medication Sig Start Date End Date Taking? Authorizing Provider  albuterol (PROVENTIL HFA;VENTOLIN HFA) 108 (90 BASE) MCG/ACT inhaler Inhale 2 puffs into the lungs every 4 (four) hours as needed for shortness of breath.   Yes [provider]  albuterol (PROVENTIL) (2.5 MG/3ML) 0.083% nebulizer solution Take 2.5 mg by nebulization every 6 (six) hours as needed for wheezing or shortness of breath.   Yes [provider]  allopurinol (ZYLOPRIM) 100 MG tablet Take 100 mg by mouth daily.  07/03/16  Yes [provider]  amLODipine (NORVASC) 5 MG tablet Take 5 mg by mouth daily.   Yes [provider]  ANORO ELLIPTA 62.5-25 MCG/INH AEPB Inhale 1 puff into the lungs daily.  05/22/18  Yes [provider]  canagliflozin (INVOKANA) 100 MG TABS tablet Take 100 mg by mouth daily.   Yes [provider]  CARTIA XT 240 MG 24 hr capsule Take 240 mg by mouth daily. 06/17/14  Yes [provider]  cloNIDine (CATAPRES) 0.1 MG tablet Take 0.1 mg by mouth 2 (two) times daily.   Yes [provider]  diclofenac sodium (VOLTAREN) 1 % GEL Apply 2 g topically daily as needed (fibromyalgia pain).   Yes [provider]  DULoxetine (CYMBALTA) 20 MG capsule Take 20 mg by mouth daily. 01/11/18  Yes [provider]  DULoxetine (CYMBALTA) 60 MG capsule  Take 60 mg by mouth at bedtime.    Yes [provider]  fluticasone (FLONASE) 50 MCG/ACT nasal spray Place 1 spray into both nostrils daily.  09/04/18  Yes [provider]  gabapentin (NEURONTIN) 300 MG capsule Take 300 mg by mouth at bedtime.    Yes [provider]  hydrochlorothiazide (HYDRODIURIL) 12.5 MG tablet Take 12.5 mg by mouth daily.  07/02/17  Yes [provider]  levocetirizine (XYZAL) 5 MG tablet Take 5 mg by mouth at bedtime. 02/11/19  Yes [provider]  LINZESS 145 MCG CAPS capsule Take 145 mcg by mouth daily before breakfast.  08/08/17  Yes [provider]  meclizine (ANTIVERT) 25 MG tablet Take 1 tablet (25 mg total) by mouth 3 (three) times daily as needed for dizziness. 09/29/16  Yes Charlesetta Shanks, MD  metFORMIN (GLUCOPHAGE) 500 MG tablet Take 500 mg by mouth at bedtime.   Yes [provider]  metoCLOPramide (REGLAN) 10 MG tablet Take 1 tablet (10 mg total) by mouth every 6 (six) hours as needed for nausea (or headache). [id number]  Yes Delora Fuel, MD  omeprazole (PRILOSEC) 20 MG capsule Take 20 mg by mouth 2 (two) times daily.   Yes [provider]  pravastatin (PRAVACHOL) 40 MG tablet Take 40 mg by mouth every evening.  12/18/18  Yes [provider]  QUEtiapine (SEROQUEL) 50 MG tablet Take 50 mg by mouth at bedtime.    Yes [provider]  REXULTI 2 MG TABS tablet Take 2 mg by mouth daily. 01/28/19  Yes [provider]  tiZANidine (ZANAFLEX) 4 MG tablet Take 4 mg by mouth 2 (two) times daily as needed for muscle spasms. 01/29/18  Yes [provider]  warfarin (COUMADIN) 10 MG tablet TAKE 1 TABLET BY MOUTH ON Laurine Blazer, FRIDAY 10/28/18  Yes Owens Shark, NP  warfarin (COUMADIN) 5 MG tablet TAKE 1 TABLET BY MOUTH DAILY(EXCEPT ON Monday, Wednesday, Friday) OR AS DIRECTED 09/30/18  Yes Ladell Pier, MD  zonisamide (ZONEGRAN) 50 MG capsule Take 50 mg by mouth 2 (two)  times a day. 06/10/18  Yes [provider]  magnesium oxide (MAG-OX) 400 (241.3 Mg) MG tablet Take 1 tablet (400 mg total) by mouth daily. Patient not taking: Reported on 02/19/2019 02/16/17   Rancour, Annie Main, MD  neomycin-polymyxin-hydrocortisone (CORTISPORIN) OTIC solution Apply 1-2 drops to toe after soaking BID Patient not taking: Reported on 02/19/2019 09/30/18   Wallene Huh, DPM  oxyCODONE (OXY IR/ROXICODONE) 5 MG immediate release tablet Take 1-2 tablets (5-10 mg total) by mouth every 4 (four) hours as needed for severe pain. Patient not taking: Reported on 10/28/2018 02/28/17   Erroll Luna, MD  traMADol (ULTRAM) 50 MG tablet TAKE 1-2 TABLETS BY MOUTH EVERY DAY AS NEEDED FOR PAIN Patient not taking: Reported on 02/19/2019 09/04/16   Meredith Pel, MD  traMADol (ULTRAM) 50 MG tablet Take 1 tablet (50 mg total) by mouth every 12 (twelve) hours as needed. Patient not taking: Reported on 02/19/2019 02/12/19   Meredith Pel, MD    Family History Family History  Problem Relation Age of Onset  . Hypertension Mother   . Diabetes Mother   . Cancer Mother        Pancreatic  . Hypertension Father   . Heart failure Father     Social History Social History   Tobacco Use  . Smoking status: Former Smoker    Start date: 03/20/2001    Quit date: 03/21/2003    Years since quitting: 15.9  . Smokeless tobacco: Never Used  . Tobacco comment: smoked for 2 years  Substance Use Topics  . Alcohol use: No    Alcohol/week: 0.0 standard drinks    Comment: beer in past   . Drug use: Yes    Types: Cocaine    Comment: quit 2003-rehab progam in 2005     Allergies   Lisinopril and Corticosteroids   Review of Systems Review of Systems  Constitutional: Negative for appetite change and fatigue.  HENT: Negative for congestion, ear discharge and sinus pressure.   Eyes: Negative for discharge.  Respiratory: Negative for cough.   Cardiovascular: Negative for chest pain.   Gastrointestinal: Negative for abdominal pain and diarrhea.  Genitourinary: Negative for frequency and hematuria.  Musculoskeletal: Negative for back pain.  Moderate weakness to right arm and right leg  Skin: Negative for rash.  Neurological: Positive for weakness. Negative for seizures and headaches.  Psychiatric/Behavioral: Negative for hallucinations.     Physical Exam Updated Vital Signs BP (!) 147/87 (BP Location: Left Arm)   Pulse 79   Temp 98.7 F (37.1 C) (Oral)   Resp 18   SpO2 100%   Physical Exam Vitals signs and nursing note reviewed.  Constitutional:      Appearance: She is well-developed.  HENT:     Head: Normocephalic.     Right Ear: Tympanic membrane normal.     Nose: Nose normal.  Eyes:     General: No scleral icterus.    Conjunctiva/sclera: Conjunctivae normal.  Neck:     Musculoskeletal: Neck supple.     Thyroid: No thyromegaly.  Cardiovascular:     Rate and Rhythm: Normal rate and regular rhythm.     Heart sounds: No murmur. No friction rub. No gallop.   Pulmonary:     Breath sounds: No stridor. No wheezing or rales.  Chest:     Chest wall: No tenderness.  Abdominal:     General: There is no distension.     Tenderness: There is no abdominal tenderness. There is no rebound.  Musculoskeletal:     Comments: Mild to moderate weakness to right arm right leg  Lymphadenopathy:     Cervical: No cervical adenopathy.  Skin:    Findings: No erythema or rash.  Neurological:     Mental Status: She is alert and oriented to person, place, and time.     Motor: No abnormal muscle tone.     Coordination: Coordination normal.  Psychiatric:        Behavior: Behavior normal.      ED Treatments / Results  Labs (all labs ordered are listed, but only abnormal results are displayed) Labs Reviewed  CBC WITH DIFFERENTIAL/PLATELET - Abnormal; Notable for the following components:      Result Value   Hemoglobin 11.9 (*)    RDW 19.4 (*)    All other  components within normal limits  COMPREHENSIVE METABOLIC PANEL - Abnormal; Notable for the following components:   Glucose, Bld 175 (*)    Total Protein 8.5 (*)    Total Bilirubin 0.2 (*)    All other components within normal limits  URINALYSIS, ROUTINE W REFLEX MICROSCOPIC - Abnormal; Notable for the following components:   Glucose, UA >=500 (*)    Bacteria, UA RARE (*)    All other components within normal limits    EKG None  Radiology Mr Brain Wo Contrast  Result Date: 02/19/2019 CLINICAL DATA:  Unexplained altered level of consciousness. Right-sided weakness over the last 3 weeks. EXAM: MRI HEAD WITHOUT CONTRAST TECHNIQUE: Multiplanar, multiecho pulse sequences of the brain and surrounding structures were obtained without intravenous contrast. COMPARISON:  Head CT 03/04/2018.  MRI 08/12/2014. FINDINGS: Brain: Diffusion imaging does not show any acute or subacute infarction. The brainstem and cerebellum are normal. Cerebral hemispheres show mild chronic small-vessel ischemic change of the white matter. No cortical or large vessel territory infarction. No mass lesion, hemorrhage, hydrocephalus or extra-axial collection. Vascular: Major vessels at the base of the brain show flow. Skull and upper cervical spine: Negative Sinuses/Orbits: Clear/normal Other: None IMPRESSION: No acute or reversible finding. Mild chronic small-vessel ischemic change of the white matter. No specific cause of the clinical presentation is identified. Electronically Signed   By: Nelson Chimes M.D.   On: 02/19/2019  18:40   Mr Cervical Spine Wo Contrast  Result Date: 02/19/2019 CLINICAL DATA:  Three-week history of right-sided weakness which is worsening. EXAM: MRI CERVICAL SPINE WITHOUT CONTRAST TECHNIQUE: Multiplanar, multisequence MR imaging of the cervical spine was performed. No intravenous contrast was administered. COMPARISON:  CT 09/01/2016 FINDINGS: Alignment: Straightening of the normal cervical lordosis.  Vertebrae: Previous ACDF C6-7 has a good appearance. Cord: No cord compression or primary cord lesion. Posterior Fossa, vertebral arteries, paraspinal tissues: Negative Disc levels: No abnormality at the foramen magnum or C1-2. C2-3: Shallow central disc protrusion indents the thecal sac does not compress the cord or show foraminal extension. C3-4: Right-sided predominant osteophytes and bulging disc material efface the ventral subarachnoid space but do not compress the cord or show foraminal encroachment. C4-5: Endplate osteophytes and bulging of the disc narrows the ventral subarachnoid space but do not compress the cord or show foraminal encroachment. C5-6: Spondylosis with endplate osteophytes and bulging of the disc. Narrowing of the ventral subarachnoid space but no cord compression. Mild foraminal narrowing on the right and moderate foraminal narrowing on the left. C6-7: Good appearance following previous ACDF. C7-T1: Normal interspace. IMPRESSION: No definite cause of the presenting symptoms is identified. Good appearance of the previous ACDF level of C6-7. Degenerative changes from C2-3 through C5-6 with osteophytes and bulging disc material narrowing the ventral subarachnoid space but not compressing the cord. There is only mild foraminal narrowing, with exception of the left foramen at C5-6 which is moderately narrowed. Electronically Signed   By: Nelson Chimes M.D.   On: 02/19/2019 18:45    Procedures Procedures (including critical care time)  Medications Ordered in ED Medications - No data to display   Initial Impression / Assessment and Plan / ED Course  I have reviewed the triage vital signs and the nursing notes.  Pertinent labs & imaging results that were available during my care of the patient were reviewed by me and considered in my medical decision making (see chart for details).        MRI does not show any reasoning for the weakness.  I spoke with neurology Dr. Lorraine Lax and he  recommended hospital admission with physical therapy and neurology to see the patient tomorrow morning Final Clinical Impressions(s) / ED Diagnoses   Final diagnoses:  Weakness    ED Discharge Orders    None       Milton Ferguson, MD 02/19/19 2213

## 2019-02-19 NOTE — ED Triage Notes (Signed)
Patient is from home and transported via [facility]. Patient has been experiencing 3 weeks of intermittent right sided weakness that is gradually getting worse. Also, complains of right sided abd pain with no nausea, vomiting, or diarrhea present. She ambulates with cain. When she tries to drive, she reports her right hand just slips off the steering wheels.

## 2019-02-20 ENCOUNTER — Observation Stay (HOSPITAL_BASED_OUTPATIENT_CLINIC_OR_DEPARTMENT_OTHER): Payer: Medicare Other

## 2019-02-20 ENCOUNTER — Observation Stay (HOSPITAL_COMMUNITY): Payer: Medicare Other

## 2019-02-20 ENCOUNTER — Encounter (HOSPITAL_COMMUNITY): Payer: Medicare Other

## 2019-02-20 ENCOUNTER — Encounter (HOSPITAL_COMMUNITY): Payer: Self-pay | Admitting: Radiology

## 2019-02-20 DIAGNOSIS — E1169 Type 2 diabetes mellitus with other specified complication: Secondary | ICD-10-CM | POA: Diagnosis not present

## 2019-02-20 DIAGNOSIS — I10 Essential (primary) hypertension: Secondary | ICD-10-CM | POA: Diagnosis not present

## 2019-02-20 DIAGNOSIS — J438 Other emphysema: Secondary | ICD-10-CM

## 2019-02-20 DIAGNOSIS — Z7901 Long term (current) use of anticoagulants: Secondary | ICD-10-CM

## 2019-02-20 DIAGNOSIS — Z6841 Body Mass Index (BMI) 40.0 and over, adult: Secondary | ICD-10-CM

## 2019-02-20 DIAGNOSIS — R531 Weakness: Secondary | ICD-10-CM

## 2019-02-20 DIAGNOSIS — I6389 Other cerebral infarction: Secondary | ICD-10-CM

## 2019-02-20 DIAGNOSIS — M6281 Muscle weakness (generalized): Secondary | ICD-10-CM | POA: Diagnosis not present

## 2019-02-20 DIAGNOSIS — E669 Obesity, unspecified: Secondary | ICD-10-CM

## 2019-02-20 LAB — CBC
HCT: 38.8 % (ref 36.0–46.0)
Hemoglobin: 12 g/dL (ref 12.0–15.0)
MCH: 26.5 pg (ref 26.0–34.0)
MCHC: 30.9 g/dL (ref 30.0–36.0)
MCV: 85.8 fL (ref 80.0–100.0)
Platelets: 307 10*3/uL (ref 150–400)
RBC: 4.52 MIL/uL (ref 3.87–5.11)
RDW: 19.6 % — ABNORMAL HIGH (ref 11.5–15.5)
WBC: 6 10*3/uL (ref 4.0–10.5)
nRBC: 0 % (ref 0.0–0.2)

## 2019-02-20 LAB — PROTIME-INR
INR: 1 (ref 0.8–1.2)
Prothrombin Time: 13.5 seconds (ref 11.4–15.2)

## 2019-02-20 LAB — COMPREHENSIVE METABOLIC PANEL
ALT: 26 U/L (ref 0–44)
AST: 26 U/L (ref 15–41)
Albumin: 3.6 g/dL (ref 3.5–5.0)
Alkaline Phosphatase: 73 U/L (ref 38–126)
Anion gap: 10 (ref 5–15)
BUN: 11 mg/dL (ref 8–23)
CO2: 21 mmol/L — ABNORMAL LOW (ref 22–32)
Calcium: 7.6 mg/dL — ABNORMAL LOW (ref 8.9–10.3)
Chloride: 109 mmol/L (ref 98–111)
Creatinine, Ser: 0.75 mg/dL (ref 0.44–1.00)
GFR calc Af Amer: >60 mL/min (ref >60)
GFR calc non Af Amer: >60 mL/min (ref >60)
Glucose, Bld: 145 mg/dL — ABNORMAL HIGH (ref 70–99)
Potassium: 3.1 mmol/L — ABNORMAL LOW (ref 3.5–5.1)
Sodium: 140 mmol/L (ref 135–145)
Total Bilirubin: 0.7 mg/dL (ref 0.3–1.2)
Total Protein: 7.5 g/dL (ref 6.5–8.1)

## 2019-02-20 LAB — ECHOCARDIOGRAM COMPLETE

## 2019-02-20 MED ORDER — ZONISAMIDE 25 MG PO CAPS
50.0000 mg | ORAL_CAPSULE | Freq: Two times a day (BID) | ORAL | Status: DC
  Administered 2019-02-20: 50 mg via ORAL
  Filled 2019-02-20 (×2): qty 2

## 2019-02-20 MED ORDER — FLUTICASONE PROPIONATE 50 MCG/ACT NA SUSP
1.0000 | Freq: Every day | NASAL | Status: DC
  Filled 2019-02-20: qty 16

## 2019-02-20 MED ORDER — HYDROCHLOROTHIAZIDE 12.5 MG PO CAPS
12.5000 mg | ORAL_CAPSULE | Freq: Every day | ORAL | Status: DC
  Administered 2019-02-20: 12.5 mg via ORAL
  Filled 2019-02-20: qty 1

## 2019-02-20 MED ORDER — PRAVASTATIN SODIUM 20 MG PO TABS: 40.0000 mg | ORAL_TABLET | Freq: Every evening | ORAL | Status: DC

## 2019-02-20 MED ORDER — ENOXAPARIN SODIUM 120 MG/0.8ML ~~LOC~~ SOLN
120.0000 mg | Freq: Two times a day (BID) | SUBCUTANEOUS | Status: DC
  Administered 2019-02-20: 120 mg via SUBCUTANEOUS
  Filled 2019-02-20 (×2): qty 0.8

## 2019-02-20 MED ORDER — QUETIAPINE FUMARATE 50 MG PO TABS: 50.0000 mg | ORAL_TABLET | Freq: Every day | ORAL | Status: DC

## 2019-02-20 MED ORDER — DULOXETINE HCL 30 MG PO CPEP: 60.0000 mg | ORAL_CAPSULE | Freq: Every day | ORAL | Status: DC

## 2019-02-20 MED ORDER — GABAPENTIN 300 MG PO CAPS: 300.0000 mg | ORAL_CAPSULE | Freq: Every day | ORAL | Status: DC

## 2019-02-20 MED ORDER — IOHEXOL 350 MG/ML SOLN
100.0000 mL | Freq: Once | INTRAVENOUS | Status: AC | PRN
  Administered 2019-02-20: 100 mL via INTRAVENOUS

## 2019-02-20 MED ORDER — ASPIRIN EC 81 MG PO TBEC
81.0000 mg | DELAYED_RELEASE_TABLET | Freq: Every day | ORAL | Status: DC
  Administered 2019-02-20: 81 mg via ORAL
  Filled 2019-02-20: qty 1

## 2019-02-20 MED ORDER — WARFARIN - PHARMACIST DOSING INPATIENT: Freq: Every day | Status: DC

## 2019-02-20 MED ORDER — DILTIAZEM HCL ER COATED BEADS 240 MG PO CP24
240.0000 mg | ORAL_CAPSULE | Freq: Every day | ORAL | Status: DC
  Administered 2019-02-20: 240 mg via ORAL
  Filled 2019-02-20: qty 1

## 2019-02-20 MED ORDER — TIZANIDINE HCL 4 MG PO TABS
4.0000 mg | ORAL_TABLET | Freq: Two times a day (BID) | ORAL | Status: DC | PRN
  Administered 2019-02-20: 4 mg via ORAL
  Filled 2019-02-20: qty 1

## 2019-02-20 MED ORDER — DOCUSATE SODIUM 100 MG PO CAPS
100.0000 mg | ORAL_CAPSULE | Freq: Every day | ORAL | Status: DC
  Administered 2019-02-20: 100 mg via ORAL
  Filled 2019-02-20: qty 1

## 2019-02-20 MED ORDER — WARFARIN SODIUM 10 MG PO TABS
10.0000 mg | ORAL_TABLET | Freq: Once | ORAL | Status: DC
  Filled 2019-02-20: qty 1

## 2019-02-20 MED ORDER — UMECLIDINIUM-VILANTEROL 62.5-25 MCG/INH IN AEPB
1.0000 | INHALATION_SPRAY | Freq: Every day | RESPIRATORY_TRACT | Status: DC
  Filled 2019-02-20: qty 14

## 2019-02-20 MED ORDER — DULOXETINE HCL 20 MG PO CPEP
20.0000 mg | ORAL_CAPSULE | Freq: Every day | ORAL | Status: DC
  Administered 2019-02-20: 20 mg via ORAL
  Filled 2019-02-20: qty 1

## 2019-02-20 MED ORDER — POTASSIUM CHLORIDE CRYS ER 20 MEQ PO TBCR
40.0000 meq | EXTENDED_RELEASE_TABLET | Freq: Once | ORAL | Status: AC
  Administered 2019-02-20: 40 meq via ORAL
  Filled 2019-02-20: qty 2

## 2019-02-20 MED ORDER — LORATADINE 10 MG PO TABS: 10.0000 mg | ORAL_TABLET | Freq: Every day | ORAL | Status: DC

## 2019-02-20 MED ORDER — CLONIDINE HCL 0.1 MG PO TABS
0.1000 mg | ORAL_TABLET | Freq: Two times a day (BID) | ORAL | Status: DC
  Administered 2019-02-20: 0.1 mg via ORAL
  Filled 2019-02-20: qty 1

## 2019-02-20 MED ORDER — PANTOPRAZOLE SODIUM 40 MG PO TBEC: 40.0000 mg | DELAYED_RELEASE_TABLET | Freq: Every day | ORAL | Status: DC

## 2019-02-20 MED ORDER — BREXPIPRAZOLE 2 MG PO TABS
2.0000 mg | ORAL_TABLET | Freq: Every day | ORAL | Status: DC
  Administered 2019-02-20: 2 mg via ORAL
  Filled 2019-02-20: qty 1

## 2019-02-20 MED ORDER — SODIUM CHLORIDE (PF) 0.9 % IJ SOLN
INTRAMUSCULAR | Status: AC
  Filled 2019-02-20: qty 50

## 2019-02-20 MED ORDER — AMLODIPINE BESYLATE 5 MG PO TABS
5.0000 mg | ORAL_TABLET | Freq: Every day | ORAL | Status: DC
  Administered 2019-02-20: 5 mg via ORAL
  Filled 2019-02-20: qty 1

## 2019-02-20 MED ORDER — MECLIZINE HCL 25 MG PO TABS
25.0000 mg | ORAL_TABLET | Freq: Three times a day (TID) | ORAL | Status: DC | PRN
  Administered 2019-02-20: 25 mg via ORAL
  Filled 2019-02-20: qty 1

## 2019-02-20 MED ORDER — LINACLOTIDE 145 MCG PO CAPS
145.0000 ug | ORAL_CAPSULE | Freq: Every day | ORAL | Status: DC
  Filled 2019-02-20: qty 1

## 2019-02-20 MED ORDER — ASPIRIN 81 MG PO TBEC: 81.0000 mg | DELAYED_RELEASE_TABLET | Freq: Every day | ORAL | 0 refills | Status: DC

## 2019-02-20 MED ORDER — WARFARIN SODIUM 5 MG PO TABS: 5.0000 mg | ORAL_TABLET | ORAL | Status: DC

## 2019-02-20 MED ORDER — WARFARIN SODIUM 10 MG PO TABS: 10.0000 mg | ORAL_TABLET | ORAL | Status: DC

## 2019-02-20 MED ORDER — STROKE: EARLY STAGES OF RECOVERY BOOK
Freq: Once | Status: DC
  Filled 2019-02-20: qty 1

## 2019-02-20 NOTE — Progress Notes (Signed)
ANTICOAGULATION CONSULT NOTE - Initial Consult  Pharmacy Consult for warfarin Indication: DVT/afib  Allergies  Allergen Reactions  . Lisinopril Swelling    THROAT SWELLING Pt states her throat started to "close up" after being on it for "so long."  . Corticosteroids Rash    States she developed a rash on her tongue after a steroid injection in her bac    Patient Measurements:   Heparin Dosing Weight:   Vital Signs: Temp: 97.9 F (36.6 C) (12/03 0600) Temp Source: Oral (12/03 0600) BP: 145/91 (12/03 0600) Pulse Rate: 84 (12/03 0600)  Labs: Recent Labs    02/19/19 1720  HGB 11.9*  HCT 38.6  PLT 357  CREATININE 0.91    CrCl cannot be calculated (Unknown ideal weight.).   Medical History: Past Medical History:  Diagnosis Date  . Adrenal mass ([place]) 03/226   Benign  . Arthritis    knees/multiple orthopedic conditons; lower back  . Asthma    per pt  . 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 Depression   . Diabetes mellitus    120s usually fasting -  dx more than 10 yrs ago  . Dizziness, nonspecific   . DVT (deep venous thrombosis) (HCC)    Recurrent  . Fibromyalgia   . GERD (gastroesophageal reflux disease)   . Heart murmur   . History of blood transfusion   . History of cocaine abuse ([place])    Remote history   . Hypertension    takes meds daily  . Hypothyroidism   . Lupus Anticoagulant Positive   . On home oxygen therapy    at night  . Shortness of breath    exertion or lying flat  . Sleep apnea    2l of oxygen at night (as of 12/6, she used to)    Medications:  (Not in a hospital admission)  Scheduled:  . amLODipine  5 mg Oral Daily  . brexpiprazole  2 mg Oral Daily  . cloNIDine  0.1 mg Oral BID  . diltiazem  240 mg Oral Daily  . docusate sodium  100 mg Oral Daily  . DULoxetine  20 mg Oral Daily  . DULoxetine  60 mg Oral QHS   . fluticasone  1 spray Each Nare Daily  . gabapentin  300 mg Oral QHS  . hydrochlorothiazide  12.5 mg Oral Daily  . linaclotide  145 mcg Oral QAC breakfast  . loratadine  10 mg Oral QHS  . pantoprazole  40 mg Oral Daily  . pravastatin  40 mg Oral QPM  . QUEtiapine  50 mg Oral QHS  . umeclidinium-vilanterol  1 puff Inhalation Daily  . Warfarin - Pharmacist Dosing Inpatient   Does not apply q1800  . zonisamide  50 mg Oral BID    Assessment: Patient on warfarin for hx of DVT and lupus anticoagulant. and MD wants pharmacy to dose warfarin for afib.  [place] of Therapy:  INR 2-3    Plan:  Will await for ordered INR to result before possibly continuing warfarin dose.  Tyler Deis, Shea Stakes [place] 02/20/2019,6:22 AM
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
## 2019-02-20 NOTE — ED Notes (Signed)
Pt said she does not want to be admitted.  She wants to go home.
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
## 2019-02-20 NOTE — Progress Notes (Signed)
  Echocardiogram 2D Echocardiogram has been performed.  Jennette Dubin 02/20/2019, 12:06 PM

## 2019-02-20 NOTE — Evaluation (Signed)
Occupational Therapy Evaluation Patient Details Name: [name] MRN: [id number] DOB: 1955/10/31 Today's Date: 02/20/2019    History of Present Illness [name]  is a 63 y.o. 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  Initially she just felt a little weak on the right side however lately she has been losing control of the extremity.  Example she was driving and the right hand dropped off the steering well unexpectedly.  This concerned her greatly prompting the ER presentation.  She denies any head trauma.  No fever, chills or night sweats.  She has never had symptoms like this before.  No recent medication changes.  No sick contacts.  She reports compliance with all her home medications.   Clinical Impression   Pt presents with some decreased R shoulder movement (compensates). She is L dominant. She has assistance for adls and was able to perform toileting on her own. Gave her initial strengthening HEP.  Recommend HHOT. Pt plans d/c today    Follow Up Recommendations  Home health OT    Equipment Recommendations  None recommended by OT    Recommendations for Other Services       Precautions / Restrictions Precautions Precautions: Fall Restrictions Weight Bearing Restrictions: No      Mobility Bed Mobility               General bed mobility comments: oob  Transfers Overall transfer level: Needs assistance Equipment used: None Transfers: Sit to/from Stand Sit to Stand: Supervision             Balance Overall balance assessment: Needs assistance Sitting-balance support: Feet supported Sitting balance-Leahy Scale: Good     Standing balance support: During functional activity;No upper extremity supported;Single extremity supported Standing balance-Leahy Scale: Good   Single Leg Stance - Right Leg: 0  All by PT Single Leg Stance  - Left Leg: 0 Tandem Stance - Right Leg: 3 Tandem Stance - Left Leg: 10                   ADL either performed or assessed with clinical judgement   ADL Overall ADL's : Needs assistance/impaired Eating/Feeding: Set up   Grooming: Wash/dry hands;Modified independent                   Toilet Transfer: Supervision/safety   Writer and Hygiene: Modified independent         General ADL Comments: pt has assist for other adls at baseline     Vision         Perception     Praxis      Pertinent Vitals/Pain Pain Assessment: No/denies pain     Hand Dominance Left   Extremity/Trunk Assessment Upper Extremity Assessment Upper Extremity Assessment: LUE deficits/detail LUE Deficits / Details: pt reports decreased sensation but not absent.  Compensates when lifing LUE above 90      Cervical / Trunk Assessment Cervical / Trunk Assessment: Normal   Communication Communication Communication: No difficulties   Cognition Arousal/Alertness: Awake/alert Behavior During Therapy: WFL for tasks assessed/performed Overall Cognitive Status: Within Functional Limits for tasks assessed                                     General Comments  gave pt written HEP.  She has green  band at home; gave her level 2 for horizontal ABD and biceps with written HEP.  Also recommended AROM for FF from bed to stabilize scapula, told her to keep eyes on RUE when moving, keep sharp/hot objects away and perform bil activities such as folding washcloths    Exercises     Shoulder Instructions      Home Living Family/patient expects to be discharged to:: Private residence Living Arrangements: Alone Available Help at Discharge: Personal care attendant;Family(home aid M-F from 430 pm - 7 pm, Sat/Sun 730 am - 12 pm) Type of Home: Apartment Home Access: Elevator;Level entry(ground floor apaartment)     Home Layout: One level     Bathroom  Shower/Tub: Teacher, early years/pre: Handicapped height Bathroom Accessibility: Yes   Home Equipment: Cane - single point;Walker - 4 wheels;Grab bars - tub/shower;Shower seat;Bedside commode;Grab bars - toilet   Additional Comments: pt uses rollator for gait in hallway of her apartment and outside of her home, she doesn't drive much anymore and her MD has [facility] not to. Her son and daughter come to check on her often.      Prior Functioning/Environment Level of Independence: Needs assistance  Gait / Transfers Assistance Needed: uses rollator at baseline and sometimes SPC in her apartment. she walks with group in apartment every day and does 3-4 laps up/down her apartment building hallway. ADL's / Homemaking Assistance Needed: pt has personal aid 4 hours/day, 7x/week. her aid helps with bathing, dressing, food prep.   Comments: dressing cooking bathing helps from aid, uses rollator uses; friends son has a gym that she could use,        OT Problem List: Decreased strength;Decreased activity tolerance;Impaired sensation      OT Treatment/Interventions:      OT Goals(Current goals can be found in the care plan section) Acute Rehab OT Goals Patient Stated [place]: to go home today OT [place] Formulation: All assessment and education complete, DC therapy  OT Frequency:     Barriers to D/C:            Co-evaluation              AM-PAC OT "6 Clicks" Daily Activity     Outcome Measure Help from another person eating meals?: A Little Help from another person taking care of personal grooming?: A Little Help from another person toileting, which includes using toliet, bedpan, or urinal?: A Little Help from another person bathing (including washing, rinsing, drying)?: A Lot Help from another person to put on and taking off regular upper body clothing?: A Little Help from another person to put on and taking off regular lower body clothing?: A Lot 6 Click Score: 16   End of  Session    Activity Tolerance: Patient tolerated treatment well Patient left: in chair;with call bell/phone within reach  OT Visit Diagnosis: Muscle weakness (generalized) (M62.81)                Time: [id number] OT Time Calculation (min): 23 min Charges:  OT General Charges $OT Visit: 1 Visit OT Evaluation $OT Eval Low Complexity: 1 Low  Lesle Chris, OTR/L Acute Rehabilitation Services [phone] [facility] pager [phone] office 02/20/2019  [place] 02/20/2019, 3:41 PM

## 2019-02-20 NOTE — Progress Notes (Signed)
ANTICOAGULATION CONSULT NOTE - Initial Consult  Pharmacy Consult for warfarin Indication: Clotting disorder and recurrent DVT.  Allergies  Allergen Reactions  . Lisinopril Swelling    THROAT SWELLING Pt states her throat started to "close up" after being on it for "so long."  . Corticosteroids Rash    States she developed a rash on her tongue after a steroid injection in her bac    Patient Measurements:    Vital Signs: Temp: 97.9 F (36.6 C) (12/03 0600) Temp Source: Oral (12/03 0600) BP: 174/105 (12/03 1000) Pulse Rate: 83 (12/03 1000)  Labs: Recent Labs    02/19/19 1720 02/20/19 0455 02/20/19 0530  HGB 11.9* 12.0  --   HCT 38.6 38.8  --   PLT 357 307  --   LABPROT  --   --  13.5  INR  --   --  1.0  CREATININE 0.91 0.75  --     CrCl cannot be calculated (Unknown ideal weight.).   Medical History: Past Medical History:  Diagnosis Date  . Adrenal mass ([place]) 03/226   Benign  . Arthritis    knees/multiple orthopedic conditons; lower back  . Asthma    per pt  . 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 Depression   . Diabetes mellitus    120s usually fasting -  dx more than 10 yrs ago  . Dizziness, nonspecific   . DVT (deep venous thrombosis) ([facility])    Recurrent  . Fibromyalgia   . GERD (gastroesophageal reflux disease)   . Heart murmur   . History of blood transfusion   . History of cocaine abuse ([place])    Remote history   . Hypertension    takes meds daily  . Hypothyroidism   . Lupus Anticoagulant Positive   . On home oxygen therapy    at night  . Shortness of breath    exertion or lying flat  . Sleep apnea    2l of oxygen at night (as of 12/6, she used to)    Assessment: Home warfarin regimen is 10mg  on Mo/We/Fr and 5mg  on Su/Tu/Th/Sa.  Last warfarin dose was 5mg  on 02/18/19.  Today's INR is subtherapeutic. Enoxaparin bridge  initiated.  No significant drug interaction with warfarin identified. [place] of Therapy:  INR 2-3 Monitor platelets by anticoagulation protocol: Yes   Plan:  Will order warfarin 10 mg po x 1 today. Initiated on enoxaparin 120mg  subq BID; discontinue when INR therapeutic. Pharmacy will monitor and dose warfarin as needed per daily PT/INR results.   Ules Marsala P Jensyn Shave 02/20/2019,11:50 AM

## 2019-02-20 NOTE — Consult Note (Signed)
Requesting Physician: Dr. Debbe Odea    Chief Complaint: Right-sided weakness on raising her arm   History obtained from: Patient and Chart    HPI:                                                                                                                                       [name] is a 63 y.o. 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  Patient states that approximately 4 weeks ago when she was using her tablet and laying down she suddenly felt weak on the right arm.  Symptoms lasted for short period and then improved.  She states that when she is driving she notices her right arm just slips of the steering wheel and she also has weakness in the right leg at the time.  This is been ongoing for the last 4 weeks.  Denies any slurred speech double vision.  She also states that she has often has headache but some of these episodes occur without headache as well..  She currently denies a headache and headaches not associated with photophobia or nausea.  Because symptoms kept recurring and are getting more frequent he decided to come to the emergency room    Past Medical History:  Diagnosis Date  . Adrenal mass ([place]) 03/226   Benign  . Arthritis    knees/multiple orthopedic conditons; lower back  . Asthma    per pt  . 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 Depression   . Diabetes mellitus    120s usually fasting -  dx more than 10 yrs ago  . Dizziness, nonspecific   . DVT (deep venous thrombosis) (HCC)    Recurrent  . Fibromyalgia   . GERD (gastroesophageal reflux disease)   . Heart murmur   . History of blood transfusion   . History of cocaine abuse ([place])     Remote history   . Hypertension    takes meds daily  . Hypothyroidism   . Lupus Anticoagulant Positive   . On home oxygen therapy    at night  . Shortness of breath    exertion or lying flat  . Sleep apnea    2l of oxygen at night (as of 12/6, she used to)    Past Surgical History:  Procedure Laterality Date  . ABDOMINAL HYSTERECTOMY  1989   Fibroids  . ANTERIOR LUMBAR FUSION  01/03/2012   Procedure: ANTERIOR LUMBAR FUSION 1 LEVEL;  Surgeon: Winfield Cunas, MD;  Location: [facility];  Service: Neurosurgery;  Laterality: N/A;  Lumbar Four-Five Anterior Lumbar Interbody Fusion with Instrumentation  . BACK SURGERY  cervical spine---disk disease  . CARPAL TUNNEL RELEASE     Bilateral  . CESAREAN SECTION    . CHOLECYSTECTOMY    . CYSTOSCOPY/RETROGRADE/URETEROSCOPY/STONE EXTRACTION WITH BASKET Right 04/26/2014   Procedure: CYSTOSCOPY/ RIGHT RETROGRADE/ RIGHT URETEROSCOPY/URETERAL AND RENAL PELVIS BIOPSY;  Surgeon: Alexis Frock, MD;  Location: [facility];  Service: Urology;  Laterality: Right;  . gallstones removed    . 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   bilateral  . right elbow surgery    . THYROID LOBECTOMY Right 02/27/2017  . THYROID LOBECTOMY Right 02/27/2017   Procedure: RIGHT THYROID LOBECTOMY;  Surgeon: Erroll Luna, MD;  Location: [facility];  Service: General;  Laterality: Right;  . TUBAL LIGATION      Family History  Problem Relation Age of Onset  . Hypertension Mother   . Diabetes Mother   . Cancer Mother        Pancreatic  . Hypertension Father   . Heart failure Father    Social History:  reports that she quit smoking about 15 years ago. She started smoking about 17 years ago. She has never used smokeless tobacco. She reports current drug use. Drug: Cocaine. She reports that she does not drink alcohol.  Allergies:  Allergies  Allergen Reactions  . Lisinopril Swelling    THROAT SWELLING Pt states her throat started to "close up" after being on it for "so  long."  . Corticosteroids Rash    States she developed a rash on her tongue after a steroid injection in her bac    Medications:                                                                                                                        I reviewed home medications   ROS:                                                                                                                                     14 systems reviewed and negative except above   Examination:  General: Appears well-developed, obese Psych: Affect appropriate to situation Eyes: No scleral injection HENT: No OP obstrucion Head: Normocephalic.  Cardiovascular: Normal rate and regular rhythm.  Respiratory: Effort normal and breath sounds normal to anterior ascultation GI: Soft.  No distension. There is no tenderness.  Skin: WDI    Neurological Examination ( initial)  Mental Status: Alert, oriented, thought content appropriate.  Speech fluent without evidence of aphasia. Able to follow 3 step commands without difficulty. Cranial Nerves: II: Visual fields grossly normal,  III,IV, VI: ptosis not present, extra-ocular motions intact bilaterally, pupils equal, round, reactive to light and accommodation V,VII: smile symmetric, facial light touch sensation normal bilaterally VIII: hearing normal bilaterally IX,X: uvula rises symmetrically XI: bilateral shoulder shrug XII: midline tongue extension Motor: Right : Upper extremity   5/5    Left:     Upper extremity   5/5  Lower extremity   5/5     Lower extremity   5/5 Tone and bulk:normal tone throughout; no atrophy noted Sensory: Pinprick and light touch intact throughout, bilaterally Deep Tendon Reflexes: 2+ and symmetric throughout Plantars: Right: downgoing   Left: downgoing Cerebellar: normal finger-to-nose, normal rapid alternating movements and normal  heel-to-shin test Gait: not assessed   Upon repeating the exam after having patient raise her arms for 1 minute she had mild drift in the right arm as well as difficulty raising the right leg and appeared exhausted.  She also cleaned of reduced sensation in her right face arm and leg.     Lab Results: Basic Metabolic Panel: Recent Labs  Lab 02/19/19 1720 02/20/19 0455  NA 143 140  K 3.5 3.1*  CL 107 109  CO2 25 21*  GLUCOSE 175* 145*  BUN 14 11  CREATININE 0.91 0.75  CALCIUM 8.9 7.6*    CBC: Recent Labs  Lab 02/19/19 1720 02/20/19 0455  WBC 7.6 6.0  NEUTROABS 4.6  --   HGB 11.9* 12.0  HCT 38.6 38.8  MCV 86.2 85.8  PLT 357 307    Coagulation Studies: Recent Labs    02/20/19 0530  LABPROT 13.5  INR 1.0    Imaging: Mr Brain Wo Contrast  Result Date: 02/19/2019 CLINICAL DATA:  Unexplained altered level of consciousness. Right-sided weakness over the last 3 weeks. EXAM: MRI HEAD WITHOUT CONTRAST TECHNIQUE: Multiplanar, multiecho pulse sequences of the brain and surrounding structures were obtained without intravenous contrast. COMPARISON:  Head CT 03/04/2018.  MRI 08/12/2014. FINDINGS: Brain: Diffusion imaging does not show any acute or subacute infarction. The brainstem and cerebellum are normal. Cerebral hemispheres show mild chronic small-vessel ischemic change of the white matter. No cortical or large vessel territory infarction. No mass lesion, hemorrhage, hydrocephalus or extra-axial collection. Vascular: Major vessels at the base of the brain show flow. Skull and upper cervical spine: Negative Sinuses/Orbits: Clear/normal Other: None IMPRESSION: No acute or reversible finding. Mild chronic small-vessel ischemic change of the white matter. No specific cause of the clinical presentation is identified. Electronically Signed   By: Nelson Chimes M.D.   On: 02/19/2019 18:40   Mr Cervical Spine Wo Contrast  Result Date: 02/19/2019 CLINICAL DATA:  Three-week history of  right-sided weakness which is worsening. EXAM: MRI CERVICAL SPINE WITHOUT CONTRAST TECHNIQUE: Multiplanar, multisequence MR imaging of the cervical spine was performed. No intravenous contrast was administered. COMPARISON:  CT 09/01/2016 FINDINGS: Alignment: Straightening of the normal cervical lordosis. Vertebrae: Previous ACDF C6-7 has a good appearance. Cord: No cord compression or primary cord lesion. Posterior Fossa, vertebral arteries,  paraspinal tissues: Negative Disc levels: No abnormality at the foramen magnum or C1-2. C2-3: Shallow central disc protrusion indents the thecal sac does not compress the cord or show foraminal extension. C3-4: Right-sided predominant osteophytes and bulging disc material efface the ventral subarachnoid space but do not compress the cord or show foraminal encroachment. C4-5: Endplate osteophytes and bulging of the disc narrows the ventral subarachnoid space but do not compress the cord or show foraminal encroachment. C5-6: Spondylosis with endplate osteophytes and bulging of the disc. Narrowing of the ventral subarachnoid space but no cord compression. Mild foraminal narrowing on the right and moderate foraminal narrowing on the left. C6-7: Good appearance following previous ACDF. C7-T1: Normal interspace. IMPRESSION: No definite cause of the presenting symptoms is identified. Good appearance of the previous ACDF level of C6-7. Degenerative changes from C2-3 through C5-6 with osteophytes and bulging disc material narrowing the ventral subarachnoid space but not compressing the cord. There is only mild foraminal narrowing, with exception of the left foramen at C5-6 which is moderately narrowed. Electronically Signed   By: Nelson Chimes M.D.   On: 02/19/2019 18:45     ASSESSMENT AND PLAN    63 y.o. 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  Right-sided knee weakness is reproducible, on examining her when asked to raise her arm patient after about 1 minute kept dropping her right arm.  She also appeared to be weaker in the right leg.  Prior to this she had 5 out of 5 strength in all extremities.  After testing her a few minutes later she appeared to have normal strength again.  Her reflexes were normal.  She underwent MRI brain which is unremarkable for stroke and MRI C-spine which which showed reduced subarachnoid space but no cord compression. While description of some of her symptoms is concerning for subclavian steal syndrome, some of her symptoms are slightly atypical including sensory symptoms of right face arm and leg.  However she does appear to have drift in the right arm up when reassessing her after raising her arms for 1 minute, did not appear like the typical pronator drift.  Impression  #Intermittent right-sided weakness #Differentials include subclavian steal syndrome, complicated migraine, conversion disorder.    Recommendations CTA head and neck to rule out subclavian stenosis No driving until further explanation confirming patient's weakness upon raising her arms  If negative, outpatient neurology follow up as well PT/OT eval   Karena Addison  [facility] Pager Number [phone]

## 2019-02-20 NOTE — Discharge Instructions (Signed)
Please take 10 mg of Coumadin today and 7.5 tomorrow and then go back to your regular schedule.  Have your INR checked on Monday.  Follow up with neurology if your symptoms recur.  You were cared for by a hospitalist during your hospital stay. If you have any questions about your discharge medications or the care you received while you were in the hospital after you are discharged, you can call the unit and asked to speak with the hospitalist on call if the hospitalist that took care of you is not available. Once you are discharged, your primary care physician will handle any further medical issues.   Please note that NO REFILLS for any discharge medications will be authorized once you are discharged, as it is imperative that you return to your primary care physician (or establish a relationship with a primary care physician if you do not have one) for your aftercare needs so that they can reassess your need for medications and monitor your lab values.  Please take all your medications with you for your next visit with your Primary MD. Please ask your Primary MD to get all Hospital records sent to his/her office. Please request your Primary MD to go over all hospital test results at the follow up.   If you experience worsening of your admission symptoms, develop shortness of breath, chest pain, suicidal or homicidal thoughts or a life threatening emergency, you must seek medical attention immediately by calling 911 or calling your MD.   Dennis Bast must read the complete instructions/literature along with all the possible adverse reactions/side effects for all the medicines you take including new medications that have been prescribed to you. Take new medicines after you have completely understood and accpet all the possible adverse reactions/side effects.    Do not drive when taking pain medications or sedatives.     Do not take more than prescribed Pain, Sleep and Anxiety Medications   If you have  smoked or chewed Tobacco in the last 2 yrs please stop. Stop any regular alcohol  and or recreational drug use.   Wear Seat belts while driving.
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
## 2019-02-26 ENCOUNTER — Inpatient Hospital Stay: Payer: Medicare Other | Attending: Oncology

## 2019-02-26 ENCOUNTER — Other Ambulatory Visit: Payer: Self-pay

## 2019-02-26 DIAGNOSIS — Z86718 Personal history of other venous thrombosis and embolism: Secondary | ICD-10-CM | POA: Insufficient documentation

## 2019-02-26 DIAGNOSIS — D649 Anemia, unspecified: Secondary | ICD-10-CM

## 2019-02-26 DIAGNOSIS — I82403 Acute embolism and thrombosis of unspecified deep veins of lower extremity, bilateral: Secondary | ICD-10-CM

## 2019-02-26 DIAGNOSIS — Z7901 Long term (current) use of anticoagulants: Secondary | ICD-10-CM | POA: Diagnosis not present

## 2019-02-26 LAB — PROTIME-INR
INR: 3.5 — ABNORMAL HIGH (ref 0.8–1.2)
Prothrombin Time: 35.2 seconds — ABNORMAL HIGH (ref 11.4–15.2)

## 2019-02-27 ENCOUNTER — Other Ambulatory Visit: Payer: Self-pay | Admitting: Nurse Practitioner

## 2019-02-27 ENCOUNTER — Telehealth: Payer: Self-pay | Admitting: *Deleted

## 2019-02-27 DIAGNOSIS — Z7901 Long term (current) use of anticoagulants: Secondary | ICD-10-CM

## 2019-02-27 DIAGNOSIS — I82403 Acute embolism and thrombosis of unspecified deep veins of lower extremity, bilateral: Secondary | ICD-10-CM

## 2019-02-27 NOTE — Telephone Encounter (Signed)
Notified patient of INR 3.5. Decrease coumadin 5 mg daily and recheck INR in 1 week.

## 2019-02-28 ENCOUNTER — Telehealth: Payer: Self-pay | Admitting: Oncology

## 2019-02-28 NOTE — Telephone Encounter (Signed)
Scheduled appt per 12/10 sch message - pt aware of appt date and time   

## 2019-03-05 ENCOUNTER — Ambulatory Visit: Payer: Medicare Other | Admitting: Orthopedic Surgery

## 2019-03-06 ENCOUNTER — Inpatient Hospital Stay: Payer: Medicare Other

## 2019-03-09 ENCOUNTER — Other Ambulatory Visit: Payer: Self-pay

## 2019-03-09 ENCOUNTER — Emergency Department (HOSPITAL_COMMUNITY): Payer: Medicare Other

## 2019-03-09 ENCOUNTER — Encounter (HOSPITAL_COMMUNITY): Payer: Self-pay

## 2019-03-09 ENCOUNTER — Inpatient Hospital Stay (HOSPITAL_COMMUNITY)
Admission: EM | Admit: 2019-03-09 | Discharge: 2019-03-16 | DRG: 177 | Disposition: A | Discharge status: Home Health | Payer: Medicare Other | Attending: Internal Medicine | Admitting: Internal Medicine

## 2019-03-09 DIAGNOSIS — D751 Secondary polycythemia: Secondary | ICD-10-CM | POA: Diagnosis present

## 2019-03-09 DIAGNOSIS — Z800 Family history of malignant neoplasm of digestive organs: Secondary | ICD-10-CM

## 2019-03-09 DIAGNOSIS — Z6841 Body Mass Index (BMI) 40.0 and over, adult: Secondary | ICD-10-CM | POA: Diagnosis not present

## 2019-03-09 DIAGNOSIS — E669 Obesity, unspecified: Secondary | ICD-10-CM | POA: Diagnosis present

## 2019-03-09 DIAGNOSIS — Z86718 Personal history of other venous thrombosis and embolism: Secondary | ICD-10-CM | POA: Diagnosis not present

## 2019-03-09 DIAGNOSIS — G4733 Obstructive sleep apnea (adult) (pediatric): Secondary | ICD-10-CM

## 2019-03-09 DIAGNOSIS — M797 Fibromyalgia: Secondary | ICD-10-CM | POA: Diagnosis present

## 2019-03-09 DIAGNOSIS — R4182 Altered mental status, unspecified: Secondary | ICD-10-CM | POA: Diagnosis not present

## 2019-03-09 DIAGNOSIS — A0839 Other viral enteritis: Secondary | ICD-10-CM | POA: Diagnosis present

## 2019-03-09 DIAGNOSIS — J449 Chronic obstructive pulmonary disease, unspecified: Secondary | ICD-10-CM | POA: Diagnosis present

## 2019-03-09 DIAGNOSIS — E876 Hypokalemia: Secondary | ICD-10-CM | POA: Diagnosis present

## 2019-03-09 DIAGNOSIS — J441 Chronic obstructive pulmonary disease with (acute) exacerbation: Secondary | ICD-10-CM | POA: Diagnosis present

## 2019-03-09 DIAGNOSIS — U071 COVID-19: Principal | ICD-10-CM | POA: Diagnosis present

## 2019-03-09 DIAGNOSIS — J440 Chronic obstructive pulmonary disease with acute lower respiratory infection: Secondary | ICD-10-CM | POA: Diagnosis present

## 2019-03-09 DIAGNOSIS — J9601 Acute respiratory failure with hypoxia: Secondary | ICD-10-CM | POA: Diagnosis present

## 2019-03-09 DIAGNOSIS — R509 Fever, unspecified: Secondary | ICD-10-CM | POA: Diagnosis not present

## 2019-03-09 DIAGNOSIS — E872 Acidosis: Secondary | ICD-10-CM | POA: Diagnosis present

## 2019-03-09 DIAGNOSIS — I130 Hypertensive heart and chronic kidney disease with heart failure and stage 1 through stage 4 chronic kidney disease, or unspecified chronic kidney disease: Secondary | ICD-10-CM | POA: Diagnosis present

## 2019-03-09 DIAGNOSIS — E1122 Type 2 diabetes mellitus with diabetic chronic kidney disease: Secondary | ICD-10-CM | POA: Diagnosis present

## 2019-03-09 DIAGNOSIS — Z20828 Contact with and (suspected) exposure to other viral communicable diseases: Secondary | ICD-10-CM | POA: Diagnosis not present

## 2019-03-09 DIAGNOSIS — E1169 Type 2 diabetes mellitus with other specified complication: Secondary | ICD-10-CM | POA: Diagnosis present

## 2019-03-09 DIAGNOSIS — Z20822 Contact with and (suspected) exposure to covid-19: Secondary | ICD-10-CM | POA: Diagnosis present

## 2019-03-09 DIAGNOSIS — G9340 Encephalopathy, unspecified: Secondary | ICD-10-CM | POA: Diagnosis not present

## 2019-03-09 DIAGNOSIS — E871 Hypo-osmolality and hyponatremia: Secondary | ICD-10-CM | POA: Diagnosis present

## 2019-03-09 DIAGNOSIS — I10 Essential (primary) hypertension: Secondary | ICD-10-CM | POA: Diagnosis present

## 2019-03-09 DIAGNOSIS — G9341 Metabolic encephalopathy: Secondary | ICD-10-CM | POA: Diagnosis present

## 2019-03-09 DIAGNOSIS — E1165 Type 2 diabetes mellitus with hyperglycemia: Secondary | ICD-10-CM | POA: Diagnosis present

## 2019-03-09 DIAGNOSIS — N179 Acute kidney failure, unspecified: Secondary | ICD-10-CM | POA: Diagnosis present

## 2019-03-09 DIAGNOSIS — E870 Hyperosmolality and hypernatremia: Secondary | ICD-10-CM | POA: Diagnosis present

## 2019-03-09 DIAGNOSIS — Z8249 Family history of ischemic heart disease and other diseases of the circulatory system: Secondary | ICD-10-CM

## 2019-03-09 DIAGNOSIS — Z7984 Long term (current) use of oral hypoglycemic drugs: Secondary | ICD-10-CM

## 2019-03-09 DIAGNOSIS — R7401 Elevation of levels of liver transaminase levels: Secondary | ICD-10-CM | POA: Diagnosis present

## 2019-03-09 DIAGNOSIS — J1289 Other viral pneumonia: Secondary | ICD-10-CM | POA: Diagnosis present

## 2019-03-09 DIAGNOSIS — N182 Chronic kidney disease, stage 2 (mild): Secondary | ICD-10-CM | POA: Diagnosis present

## 2019-03-09 DIAGNOSIS — I5032 Chronic diastolic (congestive) heart failure: Secondary | ICD-10-CM | POA: Diagnosis present

## 2019-03-09 DIAGNOSIS — E860 Dehydration: Secondary | ICD-10-CM | POA: Diagnosis present

## 2019-03-09 DIAGNOSIS — R0902 Hypoxemia: Secondary | ICD-10-CM | POA: Diagnosis present

## 2019-03-09 DIAGNOSIS — K219 Gastro-esophageal reflux disease without esophagitis: Secondary | ICD-10-CM | POA: Diagnosis present

## 2019-03-09 DIAGNOSIS — Z7982 Long term (current) use of aspirin: Secondary | ICD-10-CM

## 2019-03-09 DIAGNOSIS — Z833 Family history of diabetes mellitus: Secondary | ICD-10-CM

## 2019-03-09 DIAGNOSIS — Z7901 Long term (current) use of anticoagulants: Secondary | ICD-10-CM

## 2019-03-09 DIAGNOSIS — Z79899 Other long term (current) drug therapy: Secondary | ICD-10-CM

## 2019-03-09 DIAGNOSIS — Z7951 Long term (current) use of inhaled steroids: Secondary | ICD-10-CM

## 2019-03-09 LAB — COMPREHENSIVE METABOLIC PANEL
ALT: 81 U/L — ABNORMAL HIGH (ref 0–44)
AST: 128 U/L — ABNORMAL HIGH (ref 15–41)
Albumin: 3 g/dL — ABNORMAL LOW (ref 3.5–5.0)
Alkaline Phosphatase: 84 U/L (ref 38–126)
Anion gap: 20 — ABNORMAL HIGH (ref 5–15)
BUN: 27 mg/dL — ABNORMAL HIGH (ref 8–23)
CO2: 20 mmol/L — ABNORMAL LOW (ref 22–32)
Calcium: 6.9 mg/dL — ABNORMAL LOW (ref 8.9–10.3)
Chloride: 99 mmol/L (ref 98–111)
Creatinine, Ser: 1.4 mg/dL — ABNORMAL HIGH (ref 0.44–1.00)
GFR calc Af Amer: 46 mL/min — ABNORMAL LOW (ref >60)
GFR calc non Af Amer: 40 mL/min — ABNORMAL LOW (ref >60)
Glucose, Bld: 164 mg/dL — ABNORMAL HIGH (ref 70–99)
Potassium: 2.7 mmol/L — CL (ref 3.5–5.1)
Sodium: 139 mmol/L (ref 135–145)
Total Bilirubin: 1.3 mg/dL — ABNORMAL HIGH (ref 0.3–1.2)
Total Protein: 8.7 g/dL — ABNORMAL HIGH (ref 6.5–8.1)

## 2019-03-09 LAB — URINALYSIS, ROUTINE W REFLEX MICROSCOPIC
Bacteria, UA: NONE SEEN
Bilirubin Urine: NEGATIVE
Glucose, UA: NEGATIVE mg/dL
Ketones, ur: 20 mg/dL — AB
Leukocytes,Ua: NEGATIVE
Nitrite: NEGATIVE
Protein, ur: 100 mg/dL — AB
Specific Gravity, Urine: 1.021 (ref 1.005–1.030)
pH: 5 (ref 5.0–8.0)

## 2019-03-09 LAB — POCT I-STAT 7, (LYTES, BLD GAS, ICA,H+H)
Acid-base deficit: 2 mmol/L (ref 0.0–2.0)
Bicarbonate: 20.1 mmol/L (ref 20.0–28.0)
Calcium, Ion: 0.9 mmol/L — ABNORMAL LOW (ref 1.15–1.40)
HCT: 36 % (ref 36.0–46.0)
Hemoglobin: 12.2 g/dL (ref 12.0–15.0)
O2 Saturation: 94 %
Patient temperature: 98.6
Potassium: 2.6 mmol/L — CL (ref 3.5–5.1)
Sodium: 138 mmol/L (ref 135–145)
TCO2: 21 mmol/L — ABNORMAL LOW (ref 22–32)
pCO2 arterial: 26.3 mmHg — ABNORMAL LOW (ref 32.0–48.0)
pH, Arterial: 7.492 — ABNORMAL HIGH (ref 7.350–7.450)
pO2, Arterial: 64 mmHg — ABNORMAL LOW (ref 83.0–108.0)

## 2019-03-09 LAB — LACTIC ACID, PLASMA: Lactic Acid, Venous: 1.4 mmol/L (ref 0.5–1.9)

## 2019-03-09 LAB — CBC
HCT: 60.3 % — ABNORMAL HIGH (ref 36.0–46.0)
Hemoglobin: 19.8 g/dL — ABNORMAL HIGH (ref 12.0–15.0)
MCH: 26.5 pg (ref 26.0–34.0)
MCHC: 32.8 g/dL (ref 30.0–36.0)
MCV: 80.7 fL (ref 80.0–100.0)
Platelets: 227 10*3/uL (ref 150–400)
RBC: 7.47 MIL/uL — ABNORMAL HIGH (ref 3.87–5.11)
RDW: 19.2 % — ABNORMAL HIGH (ref 11.5–15.5)
WBC: 5.9 10*3/uL (ref 4.0–10.5)
nRBC: 1.4 % — ABNORMAL HIGH (ref 0.0–0.2)

## 2019-03-09 LAB — HEMOGLOBIN A1C
Hgb A1c MFr Bld: 8.5 % — ABNORMAL HIGH (ref 4.8–5.6)
Mean Plasma Glucose: 197.25 mg/dL

## 2019-03-09 LAB — PROTIME-INR
INR: 1.8 — ABNORMAL HIGH (ref 0.8–1.2)
Prothrombin Time: 20.4 seconds — ABNORMAL HIGH (ref 11.4–15.2)

## 2019-03-09 LAB — CBG MONITORING, ED: Glucose-Capillary: 136 mg/dL — ABNORMAL HIGH (ref 70–99)

## 2019-03-09 LAB — BRAIN NATRIURETIC PEPTIDE: B Natriuretic Peptide: 40.8 pg/mL (ref 0.0–100.0)

## 2019-03-09 LAB — POC SARS CORONAVIRUS 2 AG -  ED: SARS Coronavirus 2 Ag: NEGATIVE

## 2019-03-09 MED ORDER — WARFARIN - PHARMACIST DOSING INPATIENT: Freq: Every day | Status: DC

## 2019-03-09 MED ORDER — ACETAMINOPHEN 325 MG PO TABS: 650.0000 mg | ORAL_TABLET | Freq: Four times a day (QID) | ORAL | Status: DC | PRN

## 2019-03-09 MED ORDER — SODIUM CHLORIDE 0.9 % IV SOLN
2.0000 g | Freq: Once | INTRAVENOUS | Status: AC
  Administered 2019-03-09: 2 g via INTRAVENOUS
  Filled 2019-03-09: qty 2

## 2019-03-09 MED ORDER — INSULIN ASPART 100 UNIT/ML ~~LOC~~ SOLN: 0.0000 [IU] | Freq: Three times a day (TID) | SUBCUTANEOUS | Status: DC

## 2019-03-09 MED ORDER — VANCOMYCIN HCL 10 G IV SOLR
2500.0000 mg | Freq: Once | INTRAVENOUS | Status: AC
  Administered 2019-03-09: 2500 mg via INTRAVENOUS
  Filled 2019-03-09: qty 2500

## 2019-03-09 MED ORDER — METHYLPREDNISOLONE SODIUM SUCC 125 MG IJ SOLR
0.5000 mg/kg | Freq: Two times a day (BID) | INTRAMUSCULAR | Status: AC
  Administered 2019-03-10 – 2019-03-11 (×4): 63.125 mg via INTRAVENOUS
  Filled 2019-03-09 (×4): qty 2

## 2019-03-09 MED ORDER — GUAIFENESIN-DM 100-10 MG/5ML PO SYRP: 5.0000 mL | ORAL_SOLUTION | ORAL | Status: DC | PRN

## 2019-03-09 MED ORDER — VANCOMYCIN HCL IN DEXTROSE 1-5 GM/200ML-% IV SOLN: 1000.0000 mg | Freq: Once | INTRAVENOUS | Status: DC

## 2019-03-09 MED ORDER — ACETAMINOPHEN 325 MG PO TABS
650.0000 mg | ORAL_TABLET | Freq: Once | ORAL | Status: AC
  Administered 2019-03-09: 20:00:00 650 mg via ORAL
  Filled 2019-03-09: qty 2

## 2019-03-09 MED ORDER — POTASSIUM CHLORIDE CRYS ER 20 MEQ PO TBCR
40.0000 meq | EXTENDED_RELEASE_TABLET | Freq: Once | ORAL | Status: AC
  Administered 2019-03-10: 01:00:00 40 meq via ORAL
  Filled 2019-03-09: qty 2

## 2019-03-09 MED ORDER — IPRATROPIUM-ALBUTEROL 20-100 MCG/ACT IN AERS
2.0000 | INHALATION_SPRAY | Freq: Four times a day (QID) | RESPIRATORY_TRACT | Status: DC
  Administered 2019-03-10 – 2019-03-11 (×3): 2 via RESPIRATORY_TRACT
  Filled 2019-03-09 (×2): qty 4

## 2019-03-09 MED ORDER — WARFARIN SODIUM 7.5 MG PO TABS
7.5000 mg | ORAL_TABLET | Freq: Once | ORAL | Status: DC
  Filled 2019-03-09: qty 1

## 2019-03-09 MED ORDER — POTASSIUM CHLORIDE CRYS ER 20 MEQ PO TBCR
40.0000 meq | EXTENDED_RELEASE_TABLET | Freq: Once | ORAL | Status: AC
  Administered 2019-03-09: 40 meq via ORAL
  Filled 2019-03-09: qty 2

## 2019-03-09 MED ORDER — ALBUTEROL SULFATE HFA 108 (90 BASE) MCG/ACT IN AERS
2.0000 | INHALATION_SPRAY | RESPIRATORY_TRACT | Status: DC | PRN
  Administered 2019-03-11 – 2019-03-15 (×4): 2 via RESPIRATORY_TRACT
  Filled 2019-03-09: qty 6.7

## 2019-03-09 MED ORDER — VANCOMYCIN HCL IN DEXTROSE 1-5 GM/200ML-% IV SOLN
1000.0000 mg | INTRAVENOUS | Status: AC
  Administered 2019-03-10 – 2019-03-11 (×2): 1000 mg via INTRAVENOUS
  Filled 2019-03-09 (×2): qty 200

## 2019-03-09 MED ORDER — LOPERAMIDE HCL 2 MG PO CAPS: 2.0000 mg | ORAL_CAPSULE | ORAL | Status: DC | PRN

## 2019-03-09 MED ORDER — METRONIDAZOLE IN NACL 5-0.79 MG/ML-% IV SOLN
500.0000 mg | Freq: Once | INTRAVENOUS | Status: AC
  Administered 2019-03-09: 500 mg via INTRAVENOUS
  Filled 2019-03-09: qty 100

## 2019-03-09 MED ORDER — INSULIN ASPART 100 UNIT/ML ~~LOC~~ SOLN
0.0000 [IU] | Freq: Every day | SUBCUTANEOUS | Status: DC
  Administered 2019-03-11: 2 [IU] via SUBCUTANEOUS
  Administered 2019-03-12: 3 [IU] via SUBCUTANEOUS
  Administered 2019-03-13 – 2019-03-15 (×3): 2 [IU] via SUBCUTANEOUS

## 2019-03-09 MED ORDER — SODIUM CHLORIDE 0.9 % IV SOLN
2.0000 g | Freq: Two times a day (BID) | INTRAVENOUS | Status: AC
  Administered 2019-03-10 – 2019-03-11 (×4): 2 g via INTRAVENOUS
  Filled 2019-03-09 (×4): qty 2

## 2019-03-09 NOTE — ED Triage Notes (Signed)
Pt bib [facility] after c/o AMS w/ LKW at 2000 last night. Pt confused and unable to answer questions for EMS, neuro intact otherwise and able to follow commands per EMS. Pt SPO2 81% on room air, pt has hx of COPD. SPO2 up to 95% on 5 lpm o2 via [place].

## 2019-03-09 NOTE — ED Notes (Signed)
Pt to ct 

## 2019-03-09 NOTE — Progress Notes (Signed)
ANTICOAGULATION CONSULT NOTE - Initial Consult  Pharmacy Consult for Warfarin  Indication: DVT  Allergies  Allergen Reactions  . Lisinopril Swelling    THROAT SWELLING Pt states her throat started to "close up" after being on it for "so long."  . Corticosteroids Rash    States she developed a rash on her tongue after a steroid injection in her bac   Patient Measurements: Height: 5\' 6"  (167.6 cm) Weight: 279 lb 1.6 oz (126.6 kg) IBW/kg (Calculated) : 59.3  Vital Signs: Temp: 99 F (37.2 C) (12/20 2241) Temp Source: Oral (12/20 2241) BP: 152/85 (12/20 2241) Pulse Rate: 91 (12/20 2241)  Labs: Recent Labs    03/09/19 1901 03/09/19 2011 03/09/19 2012  HGB 19.8* 12.2  --   HCT 60.3* 36.0  --   PLT 227  --   --   LABPROT  --   --  20.4*  INR  --   --  1.8*  CREATININE 1.40*  --   --     Estimated Creatinine Clearance: 56 mL/min (A) (by C-G formula based on SCr of 1.4 mg/dL (H)).   Medical History: Past Medical History:  Diagnosis Date  . Adrenal mass ([place]) 03/226   Benign  . Arthritis    knees/multiple orthopedic conditons; lower back  . Asthma    per pt  . 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 Depression   . Diabetes mellitus    120s usually fasting -  dx more than 10 yrs ago  . Dizziness, nonspecific   . DVT (deep venous thrombosis) (HCC)    Recurrent  . Fibromyalgia   . GERD (gastroesophageal reflux disease)   . Heart murmur   . History of blood transfusion   . History of cocaine abuse ([place])    Remote history   . Hypertension    takes meds daily  . Hypothyroidism   . Lupus Anticoagulant Positive   . On home oxygen therapy    at night  . Shortness of breath    exertion or lying flat  . Sleep apnea    2l of oxygen at night (as of 12/6, she used to)     Assessment: 63 y/o F in the ED with altered mental status, on warfarin PTA  for hx of DVT, INR is below [place] at 1.8  Geraghty of Therapy:  INR 2-3 Monitor platelets by anticoagulation protocol: Yes   Plan:  Warfarin 7.5 mg PO x 1 now Daily PT/INR Monitor for bleeding  Narda Bonds, PharmD, BCPS Clinical Pharmacist Phone: [phone]

## 2019-03-09 NOTE — Progress Notes (Signed)
Pharmacy Antibiotic Note  [name] is a 63 y.o. female admitted on 03/09/2019 with infection of unknown source.  Pharmacy has been consulted for vancomycin/cefepime dosing. SCr 1.4 on admit.  Plan: Cefepime 2g IV x 1; then 2g IV q12h Vancomycin 2500mg  IV x1; then Vancomycin 1000 mg IV Q 24 hrs. Janik AUC 400-550. Expected AUC: 435 SCr used: 1.4 Flagyl 500mg  IV x 1 per EDP - f/u if to continue Monitor clinical progress, c/s, renal function F/u de-escalation plan/LOT, vancomycin levels as indicated   Height: 5\' 6"  (167.6 cm) Weight: 279 lb 1.6 oz (126.6 kg) IBW/kg (Calculated) : 59.3  Temp (24hrs), Avg:102.2 F (39 C), Min:102.2 F (39 C), Max:102.2 F (39 C)  No results for input(s): WBC, CREATININE, LATICACIDVEN, VANCOTROUGH, VANCOPEAK, VANCORANDOM, GENTTROUGH, GENTPEAK, GENTRANDOM, TOBRATROUGH, TOBRAPEAK, TOBRARND, AMIKACINPEAK, AMIKACINTROU, AMIKACIN in the last 168 hours.  Estimated Creatinine Clearance: 97.9 mL/min (by C-G formula based on SCr of 0.75 mg/dL).    Allergies  Allergen Reactions  . Lisinopril Swelling    THROAT SWELLING Pt states her throat started to "close up" after being on it for "so long."  . Corticosteroids Rash    States she developed a rash on her tongue after a steroid injection in her bac    Antimicrobials this admission: 12/20 vancomycin >>  12/20 cefepime >>  12/20 flagyl x 1  Dose adjustments this admission:   Microbiology results:   Elicia Lamp, PharmD, BCPS Clinical Pharmacist 03/09/2019 5:30 PM

## 2019-03-09 NOTE — ED Provider Notes (Signed)
[facility] EMERGENCY DEPARTMENT Provider Note   CSN: [id number] Arrival date & time: 03/09/19  1625     History Chief Complaint  Patient presents with  . Altered Mental Status    [name] is a 63 y.o. female. Level 5 caveat HPI 63 year old female discharged December 3 after admission for right-sided weakness with no definitive cause found who presents today with altered mental status.  History is obtained via EMS.  Patient is nonverbal at this time unable to give history.  EMS reports that they arrived at the patient's apartment same house with daughter.  The daughter had called EMS because she has called her mother and found her speech to be "garbled".  Last known normal that they were able to obtain from the daughter was last night at 8 PM.  Per report, the patient is normally ambulatory on her own and lives independently.  The patient is nonverbal and does not answer my questions but her eyes are open she does appear to attempt to follow my instructions.   They report that blood sugar was obtained at the scene and was slightly elevated Past Medical History:  Diagnosis Date  . Adrenal mass ([place]) 03/226   Benign  . Arthritis    knees/multiple orthopedic conditons; lower back  . Asthma    per pt  . 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 Depression   . Diabetes mellitus    120s usually fasting -  dx more than 10 yrs ago  . Dizziness, nonspecific   . DVT (deep venous thrombosis) (HCC)    Recurrent  . Fibromyalgia   . GERD (gastroesophageal reflux disease)   . Heart murmur   . History of blood transfusion   . History of cocaine abuse ([place])    Remote history   . Hypertension    takes meds daily  . Hypothyroidism   . Lupus Anticoagulant Positive   . On home oxygen therapy    at night  . Shortness of breath    exertion or lying flat    . Sleep apnea    2l of oxygen at night (as of 12/6, she used to)    Patient Active Problem List   Diagnosis Date Noted  . Right sided weakness 02/20/2019  . Thyroid mass 02/27/2017  . Chronic pain of right knee 05/24/2016  . Effusion, right knee 04/28/2016  . Presence of right artificial knee joint 04/28/2016  . Abnormal LFTs   . Decreased sensation   . Paresthesia 06/12/2014  . Obstruction of kidney 04/24/2014  . Diabetes type 2, uncontrolled ([place]) 04/24/2014  . Chronic anticoagulation 04/24/2014  . Crack cocaine use 04/24/2014  . Depression 04/24/2014  . Fibromyalgia 04/24/2014  . COPD (chronic obstructive pulmonary disease) ([place]) 04/24/2014  . Obstructive sleep apnea on CPAP 04/24/2014  . Lupus anticoagulant positive 04/24/2014  . Adrenal mass, right ([place]) 04/24/2014  . Right kidney mass 04/24/2014  . Hydronephrosis of right kidney 04/24/2014  . Supratherapeutic INR 04/24/2014  . Renal mass, right 04/24/2014  . Diabetes type 2, controlled ([place])   . Right lower quadrant abdominal pain   . Morbid obesity ([place]) 08/21/2013  . Unspecified constipation 08/19/2013  . COPD exacerbation ([place]) 08/17/2013  . Morbid obesity with BMI of 45.0-49.9, adult ([place]) 01/05/2012  . Bilateral deep vein thromboses ([place]) 12/13/2011  . Abdominal pain 11/05/2011  . Hypokalemia 11/05/2011  .  Sciatica 11/05/2011  . Diabetes mellitus type 2 in obese ([place]) 11/05/2011  . HTN (hypertension) 11/05/2011    Past Surgical History:  Procedure Laterality Date  . ABDOMINAL HYSTERECTOMY  1989   Fibroids  . ANTERIOR LUMBAR FUSION  01/03/2012   Procedure: ANTERIOR LUMBAR FUSION 1 LEVEL;  Surgeon: Winfield Cunas, MD;  Location: [facility];  Service: Neurosurgery;  Laterality: N/A;  Lumbar Four-Five Anterior Lumbar Interbody Fusion with Instrumentation  . BACK SURGERY     cervical spine---disk disease  . CARPAL TUNNEL RELEASE     Bilateral  . CESAREAN SECTION    . CHOLECYSTECTOMY    .  CYSTOSCOPY/RETROGRADE/URETEROSCOPY/STONE EXTRACTION WITH BASKET Right 04/26/2014   Procedure: CYSTOSCOPY/ RIGHT RETROGRADE/ RIGHT URETEROSCOPY/URETERAL AND RENAL PELVIS BIOPSY;  Surgeon: Alexis Frock, MD;  Location: [facility];  Service: Urology;  Laterality: Right;  . gallstones removed    . 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   bilateral  . right elbow surgery    . THYROID LOBECTOMY Right 02/27/2017  . THYROID LOBECTOMY Right 02/27/2017   Procedure: RIGHT THYROID LOBECTOMY;  Surgeon: Erroll Luna, MD;  Location: [facility];  Service: General;  Laterality: Right;  . TUBAL LIGATION       OB History   No obstetric history on file.     Family History  Problem Relation Age of Onset  . Hypertension Mother   . Diabetes Mother   . Cancer Mother        Pancreatic  . Hypertension Father   . Heart failure Father     Social History   Tobacco Use  . Smoking status: Former Smoker    Start date: 03/20/2001    Quit date: 03/21/2003    Years since quitting: 15.9  . Smokeless tobacco: Never Used  . Tobacco comment: smoked for 2 years  Substance Use Topics  . Alcohol use: No    Alcohol/week: 0.0 standard drinks    Comment: beer in past   . Drug use: Yes    Types: Cocaine    Comment: quit 2003-rehab progam in 2005    Home Medications Prior to Admission medications   Medication Sig Start Date End Date Taking? Authorizing Provider  albuterol (PROVENTIL HFA;VENTOLIN HFA) 108 (90 BASE) MCG/ACT inhaler Inhale 2 puffs into the lungs every 4 (four) hours as needed for shortness of breath.    [provider]  albuterol (PROVENTIL) (2.5 MG/3ML) 0.083% nebulizer solution Take 2.5 mg by nebulization every 6 (six) hours as needed for wheezing or shortness of breath.    [provider]  amLODipine (NORVASC) 5 MG tablet Take 5 mg by mouth daily.    [provider]  ANORO ELLIPTA 62.5-25 MCG/INH AEPB Inhale 1 puff into the lungs daily.  05/22/18   [provider]  aspirin EC  81 MG EC tablet Take 1 tablet (81 mg total) by mouth daily. 02/21/19   Debbe Odea, MD  canagliflozin (INVOKANA) 100 MG TABS tablet Take 100 mg by mouth daily.    [provider]  CARTIA XT 240 MG 24 hr capsule Take 240 mg by mouth daily. 06/17/14   [provider]  cloNIDine (CATAPRES) 0.1 MG tablet Take 0.1 mg by mouth 2 (two) times daily.    [provider]  diclofenac sodium (VOLTAREN) 1 % GEL Apply 2 g topically daily as needed (fibromyalgia pain).    [provider]  DULoxetine (CYMBALTA) 20 MG capsule Take 20 mg by mouth daily. 01/11/18   [provider]  DULoxetine (CYMBALTA) 60 MG capsule Take 60 mg by mouth at bedtime.     [provider]  fluticasone (FLONASE) 50 MCG/ACT nasal spray Place 1 spray into both nostrils daily.  09/04/18   [provider]  gabapentin (NEURONTIN) 300 MG capsule Take 300 mg by mouth at bedtime.     [provider]  hydrochlorothiazide (HYDRODIURIL) 12.5 MG tablet Take 12.5 mg by mouth daily.  07/02/17   [provider]  levocetirizine (XYZAL) 5 MG tablet Take 5 mg by mouth at bedtime. 02/11/19   [provider]  LINZESS 145 MCG CAPS capsule Take 145 mcg by mouth daily before breakfast.  08/08/17   [provider]  meclizine (ANTIVERT) 25 MG tablet Take 1 tablet (25 mg total) by mouth 3 (three) times daily as needed for dizziness. 09/29/16   Charlesetta Shanks, MD  metFORMIN (GLUCOPHAGE) 500 MG tablet Take 500 mg by mouth at bedtime.    [provider]  metoCLOPramide (REGLAN) 10 MG tablet Take 1 tablet (10 mg total) by mouth every 6 (six) hours as needed for nausea (or headache). [id number]   Delora Fuel, MD  omeprazole (PRILOSEC) 20 MG capsule Take 20 mg by mouth 2 (two) times daily.    [provider]  pravastatin (PRAVACHOL) 40 MG tablet Take 40 mg by mouth every evening.  12/18/18   [provider]  QUEtiapine (SEROQUEL) 50 MG tablet Take 50 mg  by mouth at bedtime.     [provider]  REXULTI 2 MG TABS tablet Take 2 mg by mouth daily. 01/28/19   [provider]  tiZANidine (ZANAFLEX) 4 MG tablet Take 4 mg by mouth 2 (two) times daily as needed for muscle spasms. 01/29/18   [provider]  warfarin (COUMADIN) 5 MG tablet TAKE 1 TABLET BY MOUTH DAILY(EXCEPT ON Monday, Wednesday, Friday) OR AS DIRECTED 09/30/18   Ladell Pier, MD  zonisamide (ZONEGRAN) 50 MG capsule Take 50 mg by mouth 2 (two) times a day. 06/10/18   [provider]    Allergies    Lisinopril and Corticosteroids  Review of Systems   Review of Systems  Unable to perform ROS: Mental status change  All other systems reviewed and are negative.   Physical Exam Updated Vital Signs SpO2 92%  Vitals:   03/09/19 1649 03/09/19 1650  BP:  (!) 141/51  Pulse: (!) 111   Resp: (!) 25   Temp:    SpO2: 93%    Rectal tem 102.2 Physical Exam Nursing note reviewed.  Constitutional:      General: She is not in acute distress.    Appearance: She is obese.  HENT:     Head: Normocephalic and atraumatic.     Right Ear: External ear normal.     Left Ear: External ear normal.     Nose: Nose normal.     Mouth/Throat:     Mouth: Mucous membranes are dry.  Eyes:     Pupils: Pupils are equal, round, and reactive to light.     Comments: Patient does not follow instructions regarding extraocular movement but pupils appear to be equal round reactive to light Appears to be some inflammation of the orbital area on the left  Cardiovascular:     Rate and Rhythm: Normal rate.     Pulses: Normal pulses.  Pulmonary:     Effort: Pulmonary effort is normal.     Comments: Rhonchi noted at left base Abdominal:     General:  Abdomen is flat. Bowel sounds are normal.     Palpations: Abdomen is soft. There is no mass.  Musculoskeletal:        General: No swelling or tenderness. Normal range of motion.     Cervical back: Normal range of motion.    Skin:    General: Skin is warm.     Capillary Refill: Capillary refill takes less than 2 seconds.  Neurological:     General: No focal deficit present.     Mental Status: She is alert.     Cranial Nerves: No cranial nerve deficit.     Comments: Patient was able to bend both of her knees up but is unable to hold straight leg against gravity. She did not follow instructions on upper extremities but seemed appeared to be moving these equally Deep tendon reflexes are equal bilaterally knees and elbows     ED Results / Procedures / Treatments   Labs (all labs ordered are listed, but only abnormal results are displayed) Labs Reviewed - No data to display  EKG EKG Interpretation  Date/Time:  Sunday March 09 2019 17:30:28 EST Ventricular Rate:  104 PR Interval:    QRS Duration: 87 QT Interval:  349 QTC Calculation: 459 R Axis:   36 Text Interpretation: Sinus tachycardia Minimal ST depression, inferior leads Confirmed by Pattricia Boss [phone]) on 03/09/2019 6:08:22 PM   Radiology CT Head Wo Contrast  Result Date: 03/09/2019 CLINICAL DATA:  Ams EXAM: CT HEAD WITHOUT CONTRAST TECHNIQUE: Contiguous axial images were obtained from the base of the skull through the vertex without intravenous contrast. COMPARISON:  Feb 20, 2019 FINDINGS: Brain: No evidence of acute territorial infarction, hemorrhage, hydrocephalus,extra-axial collection or mass lesion/mass effect. There is mild dilatation the ventricles and sulci consistent with age-related atrophy. Low-attenuation changes in the deep white matter consistent with small vessel ischemia. Vascular: No hyperdense vessel or unexpected calcification. Skull: The skull is intact. No fracture or focal lesion identified. Sinuses/Orbits: The visualized paranasal sinuses and mastoid air cells are clear. The orbits and globes intact. Other: None IMPRESSION: 1. No acute intracranial abnormality. 2. Findings consistent with mild age related atrophy and  chronic small vessel ischemia Electronically Signed   By: Prudencio Pair M.D.   On: 03/09/2019 17:44   DG Chest Port 1 View  Result Date: 03/09/2019 CLINICAL DATA:  Weakness.  Rhonchi. EXAM: PORTABLE CHEST 1 VIEW COMPARISON:  December 19, 2018 FINDINGS: Cardiomegaly. Diffuse bilateral pulmonary opacities. Prominence of the mediastinum is likely due to the portable technique. No pneumothorax. No other acute abnormalities. IMPRESSION: Findings are most consistent with cardiomegaly and at least moderate pulmonary edema. Mediastinal prominence is likely due to portable technique. Recommend a PA and lateral chest x-Namiah Dunnavant before discharge. Electronically Signed   By: Dorise Bullion III M.D   On: 03/09/2019 18:05    Procedures .Critical Care Performed by: Pattricia Boss, MD Authorized by: Pattricia Boss, MD   Critical care provider statement:    Critical care time (minutes):  45   Critical care was necessary to treat or prevent imminent or life-threatening deterioration of the following conditions:  Respiratory failure   Critical care was time spent personally by me on the following activities:  Discussions with consultants, evaluation of patient's response to treatment, examination of patient, ordering and performing treatments and interventions, ordering and review of laboratory studies, ordering and review of radiographic studies, pulse oximetry, re-evaluation of patient's condition, obtaining history from patient or surrogate and review of old charts   (including  critical care time)  Medications Ordered in ED Medications - No data to display  ED Course  I have reviewed the triage vital signs and the nursing notes.  Pertinent labs & imaging results that were available during my care of the patient were reviewed by me and considered in my medical decision making (see chart for details).    MDM Rules/Calculators/A&P                     63 yo female multiple health problems presents today with  confusion and fever to 102.  Patient clearer here with fluids antipyretics.  CXR with diffuse bilateral opacities, poc covid negative.  Broad spectrum abx started.  Due to possibiliity of CHF, bp and hr stable, will hold on fluids.  Lactic normal.   1- fever/leukocytosis- multifocal pneumonia vs edema-abx initiated, bp stable, sats 91-99%, ABG done with po2 64, lactic acid normal Patient now on 5 l/m sats at 93%- RN states has been on 2-3- unclear when turned up to 5 l/m Discussed with Dr. Marlowe Franklin and bnp added  2-hypokalemia- potassium being orally repleted .  3-metabolic encephalopathy- no focal deficit, head ct without acute abnormality 4- AKI- creatinine 1.4 last 0.75 02/20/19 5- hyperglycemia 6-transaminitis- mild-   Final Clinical Impression(s) / ED Diagnoses Final diagnoses:  Altered mental status, unspecified altered mental status type  Hypoxia  Fever, unspecified fever cause    Rx / DC Orders ED Discharge Orders    None       Pattricia Boss, MD 03/09/19 2059

## 2019-03-09 NOTE — H&P (Addendum)
History and Physical    [name] XJO:[id number] DOB: [birth year] DOA: 03/09/2019  PCP: Loretha Brasil, FNP Patient coming from: Home  Chief Complaint: Altered mental status  HPI: [name] is a 63 y.o. 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  Daughter had called EMS because she found that the patient's speech was "garbled."  Last known normal at 8 PM on 12/19.  Per report, patient is normally ambulatory on her own and lives independently.  Patient was awake but nonverbal on arrival to the ED.  No focal neuro deficit.  Patient states she has not been feeling well for the past week or two.  She is having fevers, chills, fatigue, body aches, cough, shortness of breath, and diarrhea.  No vomiting or abdominal pain.  No recent antibiotic use.  States she lives on her own.  No additional history could be obtained from her.  ED Course: Oxygen saturation 81% on room.  ABG with pH 7.49, PCO2 26, and PO2 64.  Febrile with temperature 102.2 F.  Tachycardic and tachypneic.  Not hypotensive.  SARS-CoV-2 rapid antigen test negative, PCR test pending.  No leukocytosis.  Lactic acid normal.  Hemoglobin 19.8, ranging between 11.8-12.0 on recent labs.  Potassium 2.7.  Bicarb 20, anion gap 20.  Blood glucose 164.  BUN 27, creatinine 1.4.  Creatinine ranging between 0.6-0.9 on recent labs.  Corrected calcium 7.7.  AST 128, ALT 81, and T bili 1.3.  Alk phos normal.  UA not suggestive of infection. Blood culture x2 pending. Chest x-ray (personally reviewed) showing cardiomegaly and diffuse bilateral pulmonary opacities. Head CT negative for acute intracranial abnormality. Patient received Tylenol, potassium supplementation, vancomycin, cefepime, and  metronidazole.  Review of Systems:  All systems reviewed and apart from history of presenting illness, are negative.  Past Medical History:  Diagnosis Date  . Adrenal mass ([place]) 03/226   Benign  . Arthritis    knees/multiple orthopedic conditons; lower back  . Asthma    per pt  . 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 Depression   . Diabetes mellitus    120s usually fasting -  dx more than 10 yrs ago  . Dizziness, nonspecific   . DVT (deep venous thrombosis) (HCC)    Recurrent  . Fibromyalgia   . GERD (gastroesophageal reflux disease)   . Heart murmur   . History of blood transfusion   . History of cocaine abuse ([place])    Remote history   . Hypertension    takes meds daily  . Hypothyroidism   . Lupus Anticoagulant Positive   . On home oxygen therapy    at night  . Shortness of breath    exertion or lying flat  . Sleep apnea    2l of oxygen at night (as of 12/6, she used to)    Past Surgical History:  Procedure Laterality Date  . ABDOMINAL HYSTERECTOMY  1989   Fibroids  . ANTERIOR LUMBAR FUSION  01/03/2012   Procedure: ANTERIOR LUMBAR FUSION 1 LEVEL;  Surgeon: Winfield Cunas, MD;  Location: [facility];  Service: Neurosurgery;  Laterality: N/A;  Lumbar Four-Five Anterior Lumbar Interbody Fusion with Instrumentation  .  BACK SURGERY     cervical spine---disk disease  . CARPAL TUNNEL RELEASE     Bilateral  . CESAREAN SECTION    . CHOLECYSTECTOMY    . CYSTOSCOPY/RETROGRADE/URETEROSCOPY/STONE EXTRACTION WITH BASKET Right 04/26/2014   Procedure: CYSTOSCOPY/ RIGHT RETROGRADE/ RIGHT URETEROSCOPY/URETERAL AND RENAL PELVIS BIOPSY;  Surgeon: Alexis Frock, MD;  Location: [facility];  Service: Urology;  Laterality: Right;  . gallstones removed    . 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   bilateral  . right elbow surgery    . THYROID LOBECTOMY Right 02/27/2017  .  THYROID LOBECTOMY Right 02/27/2017   Procedure: RIGHT THYROID LOBECTOMY;  Surgeon: Erroll Luna, MD;  Location: [facility];  Service: General;  Laterality: Right;  . TUBAL LIGATION       reports that she quit smoking about 15 years ago. She started smoking about 17 years ago. She has never used smokeless tobacco. She reports current drug use. Drug: Cocaine. She reports that she does not drink alcohol.  Allergies  Allergen Reactions  . Lisinopril Swelling    THROAT SWELLING Pt states her throat started to "close up" after being on it for "so long."  . Corticosteroids Rash    States she developed a rash on her tongue after a steroid injection in her bac    Family History  Problem Relation Age of Onset  . Hypertension Mother   . Diabetes Mother   . Cancer Mother        Pancreatic  . Hypertension Father   . Heart failure Father     Prior to Admission medications   Medication Sig Start Date End Date Taking? Authorizing Provider  albuterol (PROVENTIL HFA;VENTOLIN HFA) 108 (90 BASE) MCG/ACT inhaler Inhale 2 puffs into the lungs every 4 (four) hours as needed for shortness of breath.    [provider]  albuterol (PROVENTIL) (2.5 MG/3ML) 0.083% nebulizer solution Take 2.5 mg by nebulization every 6 (six) hours as needed for wheezing or shortness of breath.    [provider]  amLODipine (NORVASC) 5 MG tablet Take 5 mg by mouth daily.    [provider]  ANORO ELLIPTA 62.5-25 MCG/INH AEPB Inhale 1 puff into the lungs daily.  05/22/18   [provider]  aspirin EC 81 MG EC tablet Take 1 tablet (81 mg total) by mouth daily. 02/21/19   Debbe Odea, MD  canagliflozin (INVOKANA) 100 MG TABS tablet Take 100 mg by mouth daily.    [provider]  CARTIA XT 240 MG 24 hr capsule Take 240 mg by mouth daily. 06/17/14   [provider]  cloNIDine (CATAPRES) 0.1 MG tablet Take 0.1 mg by mouth 2 (two) times daily.    [provider]  diclofenac  sodium (VOLTAREN) 1 % GEL Apply 2 g topically daily as needed (fibromyalgia pain).    [provider]  DULoxetine (CYMBALTA) 20 MG capsule Take 20 mg by mouth daily. 01/11/18   [provider]  DULoxetine (CYMBALTA) 60 MG capsule Take 60 mg by mouth at bedtime.     [provider]  fluticasone (FLONASE) 50 MCG/ACT nasal spray Place 1 spray into both nostrils daily.  09/04/18   [provider]  gabapentin (NEURONTIN) 300 MG capsule Take 300 mg by mouth at bedtime.     [provider]  hydrochlorothiazide (HYDRODIURIL) 12.5 MG tablet Take 12.5 mg by mouth daily.  07/02/17   [provider]  levocetirizine (XYZAL) 5 MG tablet Take 5 mg by mouth at bedtime. 02/11/19  [provider]  LINZESS 145 MCG CAPS capsule Take 145 mcg by mouth daily before breakfast.  08/08/17   [provider]  meclizine (ANTIVERT) 25 MG tablet Take 1 tablet (25 mg total) by mouth 3 (three) times daily as needed for dizziness. 09/29/16   Charlesetta Shanks, MD  metFORMIN (GLUCOPHAGE) 500 MG tablet Take 500 mg by mouth at bedtime.    [provider]  metoCLOPramide (REGLAN) 10 MG tablet Take 1 tablet (10 mg total) by mouth every 6 (six) hours as needed for nausea (or headache). 10/12/34   Delora Fuel, MD  omeprazole (PRILOSEC) 20 MG capsule Take 20 mg by mouth 2 (two) times daily.    [provider]  pravastatin (PRAVACHOL) 40 MG tablet Take 40 mg by mouth every evening.  12/18/18   [provider]  QUEtiapine (SEROQUEL) 50 MG tablet Take 50 mg by mouth at bedtime.     [provider]  REXULTI 2 MG TABS tablet Take 2 mg by mouth daily. 01/28/19   [provider]  tiZANidine (ZANAFLEX) 4 MG tablet Take 4 mg by mouth 2 (two) times daily as needed for muscle spasms. 01/29/18   [provider]  warfarin (COUMADIN) 5 MG tablet TAKE 1 TABLET BY MOUTH DAILY(EXCEPT ON Monday, Wednesday, Friday) OR AS DIRECTED 09/30/18    Ladell Pier, MD  zonisamide (ZONEGRAN) 50 MG capsule Take 50 mg by mouth 2 (two) times a day. 06/10/18   [provider]    Physical Exam: Vitals:   03/09/19 1649 03/09/19 1650 03/09/19 1938 03/09/19 2241  BP:  (!) 141/51 (!) 155/77 (!) 152/85  Pulse: (!) 111  100 91  Resp: (!) 25  (!) 38 (!) 25  Temp:    99 F (37.2 C)  TempSrc:    Oral  SpO2: 93%  91% 92%  Weight:      Height:        Physical Exam  Constitutional: She appears well-developed and well-nourished. No distress.  HENT:  Head: Normocephalic.  Eyes: EOM are normal. Right eye exhibits no discharge. Left eye exhibits no discharge.  Cardiovascular: Normal rate, regular rhythm and intact distal pulses.  Pulmonary/Chest:  Tachypneic with respiratory rate range ranging from 20s to 30s Oxygen saturation ranging between 94-96% on 4.5 L supplemental oxygen via nasal cannula Coarse breath sounds diffusely Mild end expiratory wheezing  Abdominal: Soft. Bowel sounds are normal. She exhibits no distension. There is no abdominal tenderness. There is no guarding.  Musculoskeletal:        General: No edema.     Cervical back: Neck supple.  Neurological: No cranial nerve deficit.  Awake and alert Oriented to person and place only Strength 5 out of 5 in bilateral upper and lower extremities Sensation to light touch intact throughout  Skin: Skin is warm and dry. She is not diaphoretic.   Labs on Admission: I have personally reviewed following labs and imaging studies  CBC: Recent Labs  Lab 03/09/19 1901 03/09/19 2011  WBC 5.9  --   HGB 19.8* 12.2  HCT 60.3* 36.0  MCV 80.7  --   PLT 227  --    Basic Metabolic Panel: Recent Labs  Lab 03/09/19 1901 03/09/19 2011  NA 139 138  K 2.7* 2.6*  CL 99  --   CO2 20*  --   GLUCOSE 164*  --   BUN 27*  --   CREATININE 1.40*  --   CALCIUM 6.9*  --  GFR: Estimated Creatinine Clearance: 56 mL/min (A) (by C-G formula based on SCr of 1.4 mg/dL (H)). Liver  Function Tests: Recent Labs  Lab 03/09/19 1901  AST 128*  ALT 81*  ALKPHOS 84  BILITOT 1.3*  PROT 8.7*  ALBUMIN 3.0*   No results for input(s): LIPASE, AMYLASE in the last 168 hours. No results for input(s): AMMONIA in the last 168 hours. Coagulation Profile: Recent Labs  Lab 03/09/19 2012  INR 1.8*   Cardiac Enzymes: No results for input(s): CKTOTAL, CKMB, CKMBINDEX, TROPONINI in the last 168 hours. BNP (last 3 results) No results for input(s): PROBNP in the last 8760 hours. HbA1C: No results for input(s): HGBA1C in the last 72 hours. CBG: No results for input(s): GLUCAP in the last 168 hours. Lipid Profile: No results for input(s): CHOL, HDL, LDLCALC, TRIG, CHOLHDL, LDLDIRECT in the last 72 hours. Thyroid Function Tests: No results for input(s): TSH, T4TOTAL, FREET4, T3FREE, THYROIDAB in the last 72 hours. Anemia Panel: No results for input(s): VITAMINB12, FOLATE, FERRITIN, TIBC, IRON, RETICCTPCT in the last 72 hours. Urine analysis:    Component Value Date/Time   COLORURINE AMBER (A) 03/09/2019 1918   APPEARANCEUR HAZY (A) 03/09/2019 1918   LABSPEC 1.021 03/09/2019 1918   PHURINE 5.0 03/09/2019 1918   GLUCOSEU NEGATIVE 03/09/2019 1918   HGBUR MODERATE (A) 03/09/2019 1918   BILIRUBINUR NEGATIVE 03/09/2019 1918   KETONESUR 20 (A) 03/09/2019 1918   PROTEINUR 100 (A) 03/09/2019 1918   UROBILINOGEN 0.2 06/12/2014 2054   NITRITE NEGATIVE 03/09/2019 1918   LEUKOCYTESUR NEGATIVE 03/09/2019 1918    Radiological Exams on Admission: CT Head Wo Contrast  Result Date: 03/09/2019 CLINICAL DATA:  Ams EXAM: CT HEAD WITHOUT CONTRAST TECHNIQUE: Contiguous axial images were obtained from the base of the skull through the vertex without intravenous contrast. COMPARISON:  Feb 20, 2019 FINDINGS: Brain: No evidence of acute territorial infarction, hemorrhage, hydrocephalus,extra-axial collection or mass lesion/mass effect. There is mild dilatation the ventricles and sulci consistent  with age-related atrophy. Low-attenuation changes in the deep white matter consistent with small vessel ischemia. Vascular: No hyperdense vessel or unexpected calcification. Skull: The skull is intact. No fracture or focal lesion identified. Sinuses/Orbits: The visualized paranasal sinuses and mastoid air cells are clear. The orbits and globes intact. Other: None IMPRESSION: 1. No acute intracranial abnormality. 2. Findings consistent with mild age related atrophy and chronic small vessel ischemia Electronically Signed   By: Prudencio Pair M.D.   On: 03/09/2019 17:44   DG Chest Port 1 View  Result Date: 03/09/2019 CLINICAL DATA:  Weakness.  Rhonchi. EXAM: PORTABLE CHEST 1 VIEW COMPARISON:  December 19, 2018 FINDINGS: Cardiomegaly. Diffuse bilateral pulmonary opacities. Prominence of the mediastinum is likely due to the portable technique. No pneumothorax. No other acute abnormalities. IMPRESSION: Findings are most consistent with cardiomegaly and at least moderate pulmonary edema. Mediastinal prominence is likely due to portable technique. Recommend a PA and lateral chest x-ray before discharge. Electronically Signed   By: Dorise Bullion III M.D   On: 03/09/2019 18:05    EKG: Independently reviewed.  Sinus tachycardia, heart rate 104.  Artifact, nonspecific ST abnormalities.  No significant change since prior tracing.  Assessment/Plan Principal Problem:   Acute respiratory failure with hypoxia (HCC) Active Problems:   COPD with acute exacerbation (HCC)   Sepsis ([place])   Suspected COVID-19 virus infection   Acute encephalopathy   Acute hypoxic respiratory failure and sepsis secondary to suspected COVID-19 viral pneumonia Oxygen saturation 81% on room air.  ABG  with pH 7.49, PCO2 26, and PO2 64.  Oxygen saturation currently in the 94 to 96% range on 4.5 L supplemental oxygen.  Continues to be tachypneic with respiratory rate in the 20s to 30s. Chest x-ray (personally reviewed) showing diffuse bilateral  pulmonary opacities, worse on the left.  Febrile with temperature 100.2 F.  SARS-CoV-2 rapid antigen test negative, however, suspicion for COVID-19 pneumonia remains high.  Bacterial pneumonia less likely given no leukocytosis. -Received vancomycin, cefepime, and metronidazole in the ED.  Continue antibiotic coverage with vancomycin and cefepime.  Check procalcitonin level. -Solu-Medrol 0.5 mg/kg every 12 hours -Patient does not appear volume overloaded on exam.  Suspect opacities on chest x-ray are due to viral pneumonia rather than pulmonary edema.  Check BNP level.  Give diuretic if BNP elevated. -Check inflammatory markers including ferritin, fibrinogen, D-dimer, LDH, CRP -SARS-CoV-2 PCR test pending.  Continue airborne and contact precautions. -Patient is on chronic anticoagulation due to history of recurrent DVT.  INR currently slightly subtherapeutic at 1.8.  Check D-dimer level, if significantly elevated will need imaging to rule out PE. -Maintain euvolemia to net negative fluid balance. -Antitussive as needed -Tylenol as needed -Continuous pulse ox -Supplemental oxygen as needed to keep oxygen saturation above 88% given underlying COPD -Blood culture x2 pending -Monitor closely in the progressive care unit  Addendum: SARS-CoV-2 PCR test positive.  Started on remdesivir.  Acute encephalopathy Suspect related to pneumonia/viral illness and hypoxia.  No focal neuro deficit.  Head CT negative for acute intracranial abnormality.  Patient was nonverbal on arrival to the ED but currently is awake and alert and answering questions appropriately.  Slightly confused/oriented to person and place only. -Management of pneumonia as mentioned above -Continue to monitor mental status  COPD with mild exacerbation Noted to have mild end expiratory wheezing on exam. -Solu-Medrol as above -Combivent inhaler scheduled every 6 hours -Albuterol inhaler as needed  Non-insulin-dependent diabetes  mellitus -Check A1c.  Sliding scale insulin sensitive and CBG checks.  History of recurrent DVT on chronic anticoagulation Patient is on Coumadin.  INR currently slightly subtherapeutic at 1.8. -Coumadin dosing per pharmacy -Continue to monitor PT/INR  Polycythemia Hemoglobin 19.8, ranging between 11.8-12.0 on recent labs.  -Repeat CBC to confirm  Diarrhea Suspect related to viral illness.  No diarrhea since she has been in the hospital.  Patient denies recent antibiotic use.  No leukocytosis.  No complaints of abdominal pain or vomiting.  Abdominal exam benign. -GI pathogen panel -Loperamide as needed  Hypokalemia Suspect related to viral illness/diarrhea and home diuretic use.  Potassium 2.7.  EKG without acute changes.   -Replete potassium.  Check magnesium level and replete if low.  Continue to monitor electrolytes.  AKI Suspect related to dehydration in the setting of acute viral illness/diarrhea and home diuretic use.  BUN 27, creatinine 1.4.  Creatinine ranging between 0.6-0.9 on recent labs.   -Avoid giving IV fluid given suspicion for Covid pneumonia/ possible pulmonary edema.  Vieyra is to maintain euvolemia to net negative fluid balance. -Continue to monitor renal function and urine output -Avoid nephrotoxic agents  High anion gap metabolic acidosis Bicarb 20, anion gap 20.  Suspect related to AKI.  No significant hyperglycemia, however, does take an SGLT2 inhibitor at home, ?euglycemic DKA. -Check beta hydroxybutyrate level  Hypocalcemia Corrected calcium 7.7.   -Check ionized calcium level, if low replete  Elevated LFTs Suspect related to acute viral illness.  AST 128, ALT 81, and T bili 1.3.  Alk phos normal.  No  vomiting or abdominal pain. -Continue to monitor LFTs.  Consider obtaining right upper quadrant ultrasound if no improvement.  Pharmacy med rec pending.  DVT prophylaxis: Coumadin Code Status: Full code.  Discussed with the patient. Family  Communication: No family available at this time. Disposition Plan: Anticipate discharge after clinical improvement. Consults called: None Admission status: It is my clinical opinion that admission to INPATIENT is reasonable and necessary in this 63 y.o. female . presenting with acute hypoxic respiratory failure and acute encephalopathy secondary to sepsis and suspected COVID-19 viral pneumonia.  Very high risk of decompensation.  Given the aforementioned, the predictability of an adverse outcome is felt to be significant. I expect that the patient will require at least 2 midnights in the hospital to treat this condition.   The medical decision making on this patient was of high complexity and the patient is at high risk for clinical deterioration, therefore this is a level 3 visit.  Shela Leff MD [facility] Pager [phone]  If 7PM-7AM, please contact night-coverage [email or web address] Password [email or web address]  03/09/2019, 11:05 PM

## 2019-03-10 ENCOUNTER — Other Ambulatory Visit: Payer: Self-pay

## 2019-03-10 ENCOUNTER — Inpatient Hospital Stay (HOSPITAL_COMMUNITY): Payer: Medicare Other

## 2019-03-10 DIAGNOSIS — J441 Chronic obstructive pulmonary disease with (acute) exacerbation: Secondary | ICD-10-CM

## 2019-03-10 DIAGNOSIS — R0902 Hypoxemia: Secondary | ICD-10-CM

## 2019-03-10 DIAGNOSIS — R4182 Altered mental status, unspecified: Secondary | ICD-10-CM

## 2019-03-10 DIAGNOSIS — R509 Fever, unspecified: Secondary | ICD-10-CM

## 2019-03-10 DIAGNOSIS — G9340 Encephalopathy, unspecified: Secondary | ICD-10-CM

## 2019-03-10 DIAGNOSIS — Z20828 Contact with and (suspected) exposure to other viral communicable diseases: Secondary | ICD-10-CM

## 2019-03-10 LAB — CBC WITH DIFFERENTIAL/PLATELET
Abs Immature Granulocytes: 0.16 10*3/uL — ABNORMAL HIGH (ref 0.00–0.07)
Basophils Absolute: 0 10*3/uL (ref 0.0–0.1)
Basophils Relative: 0 %
Eosinophils Absolute: 0 10*3/uL (ref 0.0–0.5)
Eosinophils Relative: 0 %
HCT: 38.1 % (ref 36.0–46.0)
Hemoglobin: 12 g/dL (ref 12.0–15.0)
Immature Granulocytes: 1 %
Lymphocytes Relative: 7 %
Lymphs Abs: 0.8 10*3/uL (ref 0.7–4.0)
MCH: 26.4 pg (ref 26.0–34.0)
MCHC: 31.5 g/dL (ref 30.0–36.0)
MCV: 83.7 fL (ref 80.0–100.0)
Monocytes Absolute: 0.3 10*3/uL (ref 0.1–1.0)
Monocytes Relative: 2 %
Neutro Abs: 10.1 10*3/uL — ABNORMAL HIGH (ref 1.7–7.7)
Neutrophils Relative %: 90 %
Platelets: 388 10*3/uL (ref 150–400)
RBC: 4.55 MIL/uL (ref 3.87–5.11)
RDW: 17.8 % — ABNORMAL HIGH (ref 11.5–15.5)
WBC: 11.2 10*3/uL — ABNORMAL HIGH (ref 4.0–10.5)
nRBC: 0.6 % — ABNORMAL HIGH (ref 0.0–0.2)

## 2019-03-10 LAB — GLUCOSE, CAPILLARY
Glucose-Capillary: 182 mg/dL — ABNORMAL HIGH (ref 70–99)
Glucose-Capillary: 158 mg/dL — ABNORMAL HIGH (ref 70–99)
Glucose-Capillary: 166 mg/dL — ABNORMAL HIGH (ref 70–99)

## 2019-03-10 LAB — D-DIMER, QUANTITATIVE: D-Dimer, Quant: 1.31 ug/mL-FEU — ABNORMAL HIGH (ref 0.00–0.50)

## 2019-03-10 LAB — MAGNESIUM: Magnesium: 1.4 mg/dL — ABNORMAL LOW (ref 1.7–2.4)

## 2019-03-10 LAB — RESPIRATORY PANEL BY RT PCR (FLU A&B, COVID)
Influenza A by PCR: NEGATIVE
Influenza B by PCR: NEGATIVE
SARS Coronavirus 2 by RT PCR: POSITIVE — AB

## 2019-03-10 LAB — PROTIME-INR
INR: 1.8 — ABNORMAL HIGH (ref 0.8–1.2)
INR: 1.9 — ABNORMAL HIGH (ref 0.8–1.2)
Prothrombin Time: 20.4 seconds — ABNORMAL HIGH (ref 11.4–15.2)
Prothrombin Time: 21.3 seconds — ABNORMAL HIGH (ref 11.4–15.2)

## 2019-03-10 LAB — BETA-HYDROXYBUTYRIC ACID: Beta-Hydroxybutyric Acid: 3.11 mmol/L — ABNORMAL HIGH (ref 0.05–0.27)

## 2019-03-10 LAB — HIV ANTIBODY (ROUTINE TESTING W REFLEX): HIV Screen 4th Generation wRfx: NONREACTIVE

## 2019-03-10 LAB — FERRITIN: Ferritin: 230 ng/mL (ref 11–307)

## 2019-03-10 LAB — LACTATE DEHYDROGENASE: LDH: 702 U/L — ABNORMAL HIGH (ref 98–192)

## 2019-03-10 LAB — TROPONIN I (HIGH SENSITIVITY)
Troponin I (High Sensitivity): 40 ng/L — ABNORMAL HIGH (ref <18)
Troponin I (High Sensitivity): 41 ng/L — ABNORMAL HIGH (ref <18)

## 2019-03-10 LAB — CBG MONITORING, ED
Glucose-Capillary: 230 mg/dL — ABNORMAL HIGH (ref 70–99)
Glucose-Capillary: 173 mg/dL — ABNORMAL HIGH (ref 70–99)

## 2019-03-10 LAB — C-REACTIVE PROTEIN: CRP: 30.4 mg/dL — ABNORMAL HIGH (ref <1.0)

## 2019-03-10 LAB — PROCALCITONIN: Procalcitonin: 0.15 ng/mL

## 2019-03-10 LAB — FIBRINOGEN: Fibrinogen: >800 mg/dL — ABNORMAL HIGH (ref 210–475)

## 2019-03-10 MED ORDER — INSULIN ASPART 100 UNIT/ML ~~LOC~~ SOLN
0.0000 [IU] | SUBCUTANEOUS | Status: DC
  Administered 2019-03-10: 1 [IU] via SUBCUTANEOUS
  Administered 2019-03-11: 2 [IU] via SUBCUTANEOUS
  Administered 2019-03-11 (×2): 3 [IU] via SUBCUTANEOUS

## 2019-03-10 MED ORDER — MAGNESIUM SULFATE 2 GM/50ML IV SOLN
2.0000 g | Freq: Once | INTRAVENOUS | Status: AC
  Administered 2019-03-10: 2 g via INTRAVENOUS
  Filled 2019-03-10: qty 50

## 2019-03-10 MED ORDER — SODIUM CHLORIDE 0.9 % IV SOLN
200.0000 mg | Freq: Once | INTRAVENOUS | Status: AC
  Administered 2019-03-10: 200 mg via INTRAVENOUS
  Filled 2019-03-10: qty 40

## 2019-03-10 MED ORDER — ENOXAPARIN SODIUM 150 MG/ML ~~LOC~~ SOLN
130.0000 mg | Freq: Two times a day (BID) | SUBCUTANEOUS | Status: DC
  Filled 2019-03-10 (×2): qty 0.87

## 2019-03-10 MED ORDER — INSULIN ASPART 100 UNIT/ML ~~LOC~~ SOLN
3.0000 [IU] | Freq: Three times a day (TID) | SUBCUTANEOUS | Status: DC
  Administered 2019-03-11 (×2): 3 [IU] via SUBCUTANEOUS

## 2019-03-10 MED ORDER — SODIUM CHLORIDE 0.9 % IV SOLN
100.0000 mg | Freq: Every day | INTRAVENOUS | Status: AC
  Administered 2019-03-11 – 2019-03-14 (×4): 100 mg via INTRAVENOUS
  Filled 2019-03-10 (×4): qty 20

## 2019-03-10 MED ORDER — ENOXAPARIN SODIUM 150 MG/ML ~~LOC~~ SOLN
130.0000 mg | Freq: Two times a day (BID) | SUBCUTANEOUS | Status: DC
  Administered 2019-03-10 – 2019-03-11 (×3): 130 mg via SUBCUTANEOUS
  Filled 2019-03-10 (×4): qty 0.87

## 2019-03-10 MED ORDER — MAGNESIUM OXIDE 400 (241.3 MG) MG PO TABS
400.0000 mg | ORAL_TABLET | Freq: Two times a day (BID) | ORAL | Status: DC
  Administered 2019-03-11 – 2019-03-16 (×9): 400 mg via ORAL
  Filled 2019-03-10 (×11): qty 1

## 2019-03-10 MED ORDER — ACETAMINOPHEN 650 MG RE SUPP: 650.0000 mg | Freq: Four times a day (QID) | RECTAL | Status: DC | PRN

## 2019-03-10 NOTE — ED Notes (Signed)
Pt continues to have generalized upper body shaking.  Sister has reported on phone that pt has a plate to her neck as well.

## 2019-03-10 NOTE — ED Notes (Signed)
Patient placed on hospital bed, patient still has periods of slurred speech. Placed on bedpan she stated she thought she needed to have a bowel movement

## 2019-03-10 NOTE — ED Notes (Addendum)
Pt incontinent of stool and cleansed with tech help.  Pt able to roll side to side and assist.  Opt able to swallow breakfast cereal, but does not want to eat her options.

## 2019-03-10 NOTE — ED Notes (Signed)
reportedly only x 1 blood culture done, although antibiotics have been given.

## 2019-03-10 NOTE — ED Notes (Signed)
Ordered a hospital bed per rn Kelly--Jacobb Alen

## 2019-03-10 NOTE — ED Notes (Signed)
Patient is resting on stretcher , sheets changed and patient repositioned difficult to understand patient when she is talking at times. Patient will follow commands.

## 2019-03-10 NOTE — ED Notes (Signed)
Leda Gauze- Sister [phone]  Point of contact.

## 2019-03-10 NOTE — ED Notes (Signed)
Pt is having an all over body shaking episode with family on the phone.  Attempt to update the family, but unsuccessful at this time.  Admitting MD has seen this pt with no new orders and did not stop and speak with this RN.  Message to MD to contact family with update.

## 2019-03-10 NOTE — Progress Notes (Signed)
EEG complete - results pending 

## 2019-03-10 NOTE — ED Notes (Signed)
Daughter Eliezer Bottom called and update on patient status given.

## 2019-03-10 NOTE — Progress Notes (Addendum)
PROGRESS NOTE  [name] SEG:[id number] DOB: 12/05/1955 DOA: 03/09/2019 PCP: Colleen Brasil, FNP   LOS: 1 day   Brief narrative:  As per HPI: [name] is a 63 y.o. 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  Daughter had called EMS because she found that the patient's speech was "garbled."  Last known normal at 8 PM on 12/19.  Per report, patient is normally ambulatory on her own and lives independently.  Patient was awake but nonverbal on arrival to the ED.  No focal neuro deficit.  ED Course: Oxygen saturation 81% on room.  ABG with pH 7.49, PCO2 26, and PO2 64.  Febrile with temperature 102.2 F.  Tachycardic and tachypneic.  Not hypotensive.  SARS-CoV-2  PCR test was positive.  No leukocytosis.  Lactic acid normal.  Hemoglobin 19.8, ranging between 11.8-12.0 on recent labs.  Potassium 2.7.  Bicarb 20, anion gap 20.  Blood glucose 164.  BUN 27, creatinine 1.4.  Creatinine ranging between 0.6-0.9 on recent labs.  Corrected calcium 7.7.  AST 128, ALT 81, and T bili 1.3.  Alk phos normal.  UA not suggestive of infection. Blood culture x2 pending.Chest x-ray showed cardiomegaly and diffuse bilateral pulmonary opacities. Head CT negative for acute intracranial abnormality. Patient received Tylenol, potassium supplementation, vancomycin, cefepime, and metronidazole.  Assessment/Plan:  Principal Problem:   Acute respiratory failure with hypoxia (HCC) Active Problems:   COPD with acute exacerbation (HCC)   Sepsis ([place])   Suspected COVID-19 virus infection   Acute encephalopathy  Acute hypoxic respiratory failure and sepsis secondary to COVID-19 viral pneumonia.   T-max of 102.2 F.  Patient received vancomycin, cefepime, and metronidazole in  the ED.  Elevated inflammatory markers.  Influenza was negative.  Continue Solu-Medrol, oxygen inhalers.   Follow blood cultures.  Continue remdesivir.  ProCalcitonin of 0.15.  Currently on IV antibiotics as well.  Please de-escalate by tomorrow if the cultures are negative.  Patient is on 4 L of nasal cannula saturating 93% at this time.  COVID-19 Labs  Recent Labs    03/09/19 2342  DDIMER 1.31*  FERRITIN 230  LDH 702*  CRP 30.4*   Fibrinogen elevated at more than 800  Lab Results  Component Value Date   SARSCOV2NAA POSITIVE (A) 73/71/0626    Acute metabolic encephalopathy Suspect related to pneumonia/viral illness and hypoxia.  Will closely monitor.  At this time patient is unsafe for swallowing.  Will get speech evaluation.  COPD with mild exacerbation Continue Combivent inhaler, albuterol, Solu-Medrol.  Non-insulin-dependent diabetes mellitus -Hemoglobin A1c of 8.5.  Sliding scale insulin sensitive and CBG checks.  Closely monitor on IV steroids.  History of recurrent DVT on chronic anticoagulation Continue Coumadin.  Pharmacy to dose.  Mildly subtherapeutic on presentation.  Hemoconcentration.  Improved.  Diarrhea Secondary viral illness.  As needed loperamide.  Hypokalemia Continue to replenish aggressively.  Replenish magnesium as well.  Patient received oral potassium 80 mEq.  We will give IV magnesium and p.o. magnesium as well.  AKI Secondary to volume depletion and diuretic use.  BUN 27, creatinine 1.4 on presentation..  Creatinine ranging between 0.6-0.9 on recent labs.   Avoid IV fluids due to Covid pneumonia. [place] is to maintain euvolemia to net negative fluid balance.  Avoid nephrotoxic medication.  Closely monitor  High anion gap metabolic acidosis Bicarb 20, anion gap 20.  Suspect related to AKI.  No significant hypoglycemia.  Will closely monitor.  Continue with sliding scale insulin Accu-Cheks diabetic diet.  POC glucose was 230.  Will change  Accu-Cheks every 4 hourly including sliding scale.  Hypomagnesemia.  Patient received magnesium sulfate this morning.  Check mag levels in a.m.   Hypocalcemia Corrected calcium 7.7.  Check ionized calcium level, if low replete  Elevated LFTs Suspect related to acute viral illness.  AST 128, ALT 81, and T bili 1.3.  Alk phos normal.    Daily CMP.  If not improving, consider ultrasound of the right upper quadrant.  VTE Prophylaxis: Warfarin  Code Status: Full code  Family Communication:  I spoke with the patient's daughter [name] on the phone and updated her about the clinical condition of the patient.  Spoke about the potential deterioration of her clinical condition.  Patient's daughter is aware of this.  I also spoke with the patient's sister [name] on the phone since at the patient's daughter's request.  Disposition Plan: Transfer to [facility] when bed available.  Addendum:  03/10/2019 2:01 PM  Patient with stuttering and some jerkiness of extremities.  It still appeard encephalopathy to me but since this was new finding, the family was concerned including the nursing staff.  EEG was done which did not capture any evidence of seizures but had features of diffuse encephalopathy.  I have ordered for MRI of the brain stat but there is history of plate on the neck.  We will see if patient will be able to get MRI or not.  Patient is already on anticoagulation.  Addendum:  03/10/2019 4:17 PM   MRI of the brain was performed which was not a great study but no evidence of infarction noted.  At this time, patient can be transferred to [facility] which has been scheduled.  Consultants:  None  Procedures:  None  Antibiotics: Anti-infectives (From admission, onward)   Start     Dose/Rate Route Frequency Ordered Stop   03/11/19 1000  remdesivir 100 mg in sodium chloride 0.9 % 100 mL IVPB     100 mg 200 mL/hr over 30 Minutes Intravenous Daily 03/10/19 0240 03/15/19 0959     03/10/19 2100  vancomycin (VANCOCIN) IVPB 1000 mg/200 mL premix     1,000 mg 200 mL/hr over 60 Minutes Intravenous Every 24 hours 03/09/19 2017     03/10/19 0730  ceFEPIme (MAXIPIME) 2 g in sodium chloride 0.9 % 100 mL IVPB     2 g 200 mL/hr over 30 Minutes Intravenous Every 12 hours 03/09/19 2018     03/10/19 0315  remdesivir 200 mg in sodium chloride 0.9% 250 mL IVPB     200 mg 580 mL/hr over 30 Minutes Intravenous Once 03/10/19 0240 03/10/19 0548   03/09/19 1745  vancomycin (VANCOCIN) 2,500 mg in sodium chloride 0.9 % 500 mL IVPB     2,500 mg 250 mL/hr over 120 Minutes Intravenous  Once 03/09/19 1732 03/10/19 0240   03/09/19 1730  ceFEPIme (MAXIPIME) 2 g in sodium chloride 0.9 % 100 mL IVPB     2 g 200 mL/hr over 30 Minutes Intravenous  Once 03/09/19 1717 03/09/19 2011   03/09/19 1730  metroNIDAZOLE (FLAGYL) IVPB 500 mg     500 mg 100 mL/hr over 60 Minutes Intravenous  Once 03/09/19 1717 03/09/19 2033   03/09/19 1730  vancomycin (VANCOCIN) IVPB 1000 mg/200 mL premix  Status:  Discontinued     1,000 mg 200 mL/hr over 60 Minutes Intravenous  Once 03/09/19 1717 03/09/19 1732     Subjective: Today, patient was feeling jittery and shaky with stuttering.  Unable to verbalize much but was slightly agitated.  Objective: Vitals:   03/10/19 0545 03/10/19 0650  BP: (!) 145/89 (!) 145/89  Pulse: 98 98  Resp: (!) 49 (!) 49  Temp: 99.2 F (37.3 C) 99.2 F (37.3 C)  SpO2: 92% 92%    Intake/Output Summary (Last 24 hours) at 03/10/2019 0707 Last data filed at 03/10/2019 0240 Gross per 24 hour  Intake 500 ml  Output --  Net 500 ml   Filed Weights   03/09/19 1646  Weight: 126.6 kg   Body mass index is 45.05 kg/m.   Physical Exam: GENERAL: Patient is alert awake but stuttering, confused and mildly agitated.. Morbidly obese HENT: No scleral pallor or icterus. Pupils equally reactive to light. Oral mucosa is moist NECK: is supple, no palpable thyroid enlargement. CHEST:  decreased breath sounds,coarse breath sounds noted. CVS: S1 and S2 heard, no murmur. Regular rate and rhythm. No pericardial rub. ABDOMEN: Soft, non-tender, bowel sounds are present. No palpable hepato-splenomegaly. EXTREMITIES: No edema.  Involuntary shaking of her body noted. CNS: Structuring, shaking.  Anxious, moving all extremities. SKIN: warm and dry without rashes.  Data Review: I have personally reviewed the following laboratory data and studies,  CBC: Recent Labs  Lab 03/09/19 1901 03/09/19 2011 03/10/19 0618  WBC 5.9  --  11.2*  NEUTROABS  --   --  10.1*  HGB 19.8* 12.2 12.0  HCT 60.3* 36.0 38.1  MCV 80.7  --  83.7  PLT 227  --  301   Basic Metabolic Panel: Recent Labs  Lab 03/09/19 1901 03/09/19 2011 03/09/19 2342  NA 139 138  --   K 2.7* 2.6*  --   CL 99  --   --   CO2 20*  --   --   GLUCOSE 164*  --   --   BUN 27*  --   --   CREATININE 1.40*  --   --   CALCIUM 6.9*  --   --   MG  --   --  1.4*   Liver Function Tests: Recent Labs  Lab 03/09/19 1901  AST 128*  ALT 81*  ALKPHOS 84  BILITOT 1.3*  PROT 8.7*  ALBUMIN 3.0*   No results for input(s): LIPASE, AMYLASE in the last 168 hours. No results for input(s): AMMONIA in the last 168 hours. Cardiac Enzymes: No results for input(s): CKTOTAL, CKMB, CKMBINDEX, TROPONINI in the last 168 hours. BNP (last 3 results) Recent Labs    03/09/19 1901  BNP 40.8    ProBNP (last 3 results) No results for input(s): PROBNP in the last 8760 hours.  CBG: Recent Labs  Lab 03/09/19 2349  GLUCAP 136*   Recent Results (from the past 240 hour(s))  Respiratory Panel by RT PCR (Flu A&B, Covid) - Nasopharyngeal Swab     Status: Abnormal   Collection Time: 03/09/19  7:51 PM   Specimen: Nasopharyngeal Swab  Result Value Ref Range Status   SARS Coronavirus 2 by RT PCR POSITIVE (A) NEGATIVE Final    Comment: RESULT CALLED TO, READ BACK BY AND VERIFIED WITH: RN T [facility] '@0026'$  03/10/19 BY S  GEZAHEGN (NOTE) SARS-CoV-2 target nucleic acids are DETECTED. SARS-CoV-2 RNA is generally detectable in upper respiratory specimens  during the acute phase of infection. Positive  results are indicative of the presence of the identified virus, but do not rule out bacterial infection or co-infection with other pathogens not detected by the test. Clinical correlation with patient history and other diagnostic information is necessary to determine patient infection status. The expected result is Negative. Fact Sheet for Patients:  PinkCheek.be Fact Sheet for Healthcare Providers: GravelBags.it This test is not yet approved or cleared by the [place] FDA and  has been authorized for detection and/or diagnosis of SARS-CoV-2 by FDA under an Emergency Use Authorization (EUA).  This EUA will remain in effect (meaning this test can be [place] ed) for the duration of  the COVID-19 declaration under Section 564(b)(1) of the Act, 21 U.S.C. section 360bbb-3(b)(1), unless the authorization is terminated or revoked sooner.    Influenza A by PCR NEGATIVE NEGATIVE Final   Influenza B by PCR NEGATIVE NEGATIVE Final    Comment: (NOTE) The Xpert Xpress SARS-CoV-2/FLU/RSV assay is intended as an aid in  the diagnosis of influenza from Nasopharyngeal swab specimens and  should not be used as a sole basis for treatment. Nasal washings and  aspirates are unacceptable for Xpert Xpress SARS-CoV-2/FLU/RSV  testing. Fact Sheet for Patients: PinkCheek.be Fact Sheet for Healthcare Providers: GravelBags.it This test is not yet approved or cleared by the [place] FDA and  has been authorized for detection and/or diagnosis of SARS-CoV-2 by  FDA under an Emergency Use Authorization (EUA). This EUA will remain  in effect (meaning this test can be used) for the duration of the  Covid-19 declaration  under Section 564(b)(1) of the Act, 21  U.S.C. section 360bbb-3(b)(1), unless the authorization is  terminated or revoked. Performed at [facility], [place] [address] [place], [place] [address]      Studies: CT Head Wo Contrast  Result Date: 03/09/2019 CLINICAL DATA:  Ams EXAM: CT HEAD WITHOUT CONTRAST TECHNIQUE: Contiguous axial images were obtained from the base of the skull through the vertex without intravenous contrast. COMPARISON:  Feb 20, 2019 FINDINGS: Brain: No evidence of acute territorial infarction, hemorrhage, hydrocephalus,extra-axial collection or mass lesion/mass effect. There is mild dilatation the ventricles and sulci consistent with age-related atrophy. Low-attenuation changes in the deep white matter consistent with small vessel ischemia. Vascular: No hyperdense vessel or unexpected calcification. Skull: The skull is intact. No fracture or focal lesion identified. Sinuses/Orbits: The visualized paranasal sinuses and mastoid air cells are clear. The orbits and globes intact. Other: None IMPRESSION: 1. No acute intracranial abnormality. 2. Findings consistent with mild age related atrophy and chronic small vessel ischemia Electronically Signed   By: Prudencio Pair M.D.   On: 03/09/2019 17:44   DG Chest Port 1 View  Result Date: 03/09/2019 CLINICAL DATA:  Weakness.  Rhonchi. EXAM: PORTABLE CHEST 1 VIEW COMPARISON:  December 19, 2018 FINDINGS: Cardiomegaly. Diffuse bilateral pulmonary opacities. Prominence of the mediastinum is likely due to the portable technique. No pneumothorax. No other acute abnormalities. IMPRESSION: Findings are most consistent with cardiomegaly and at least moderate pulmonary edema. Mediastinal prominence is likely due to portable technique. Recommend a PA and lateral chest x-ray before discharge. Electronically Signed   By: Dorise Bullion III M.D   On: 03/09/2019 18:05    Scheduled Meds: . insulin aspart  0-5 Units Subcutaneous QHS  . insulin aspart   0-9 Units Subcutaneous TID WC  . Ipratropium-Albuterol  2 puff Inhalation Q6H  . methylPREDNISolone (SOLU-MEDROL) injection  0.5 mg/kg Intravenous Q12H  . warfarin  7.5 mg Oral Once  .  Warfarin - Pharmacist Dosing Inpatient   Does not apply q1800    Continuous Infusions: . ceFEPime (MAXIPIME) IV    . [START ON 03/11/2019] remdesivir 100 mg in NS 100 mL    . vancomycin       Flora Lipps, MD  [facility] 03/10/2019

## 2019-03-10 NOTE — Progress Notes (Addendum)
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  Her daughter alerted EMS after she found the patient to have "garbled speech."  At baseline she is reportedly ambulatory and lives independently.  In the ED her oxygen saturations were 81% on room air.  She had a fever of 102.2.  Covid testing was positive.  CXR noted diffuse bilateral pulmonary opacities.  CT head was negative for acute abnormalities.  Significant Events: 12/20 admit via [place] 12/21 transfer to [facility]  COVID-19 specific Treatment: Remdesivir 12/20 > Solu-Medrol 12/20 > 12/22 Decadron 12/23 >  Subjective: The patient is alert and calm.  She is oriented to person and time but not place.  She follows commands.  She denies specific complaints at this time but tells me she is very hungry.  She is in no respiratory distress.  Assessment & Plan:  COVID Pneumonia -acute hypoxic respiratory failure Continue remdesivir and Decadron -no compelling evidence of bacterial pneumonia therefore will discontinue antibiotics after 30-day treatment  Recent Labs  Lab 03/09/19 1901 03/09/19 2342 03/11/19 0425 03/11/19 0522  DDIMER  --  1.31*  --  1.11*  FERRITIN  --  230 327*  --   CRP  --  30.4* 22.6*  --   ALT 81*  --   --  85*  PROCALCITON  --  0.15  --   --     Acute encephalopathy Perhaps simply due to acute febrile illness and hypoxia -appears to be coming around some -CT head negative -reportedly was fully nonverbal upon arrival to the ED -with improvement in mental status will advance diet and ask speech to evaluate - EEG unrevealing - MRI unremarkable - ?COVID encephalopathy   COPD No wheezing at  time of exam today  DM 2 A1c 8.5 -continue SSI and follow CBGs  History of recurrent DVT on chronic anticoagulation Continue Coumadin per pharmacy  Polycythemia Likely simply volume related -improving with volume expansion  Hypernatremia Free water deficit -increase free water resuscitation  Diarrhea reported history of this -does not appear to be an issue since admission -likely related to recent antibiotic course versus Covid   Hypokalemia Corrected with supplementation  Acute kidney injury Most likely prerenal in etiology -baseline creatinine 0.6-0.9 -creatinine is returned to baseline with volume expansion  Transaminitis Likely due to Covid -follow trend with ongoing use of remdesivir  DVT prophylaxis: Warfarin Code Status: FULL CODE Family Communication: spoke to the patients sister 12/22 PM Disposition Plan: progressive care   Consultants:  none  Antimicrobials:  Cefepime 12/20 > 12/22 Vancomycin 12/20 > 12/22 Flagyl 12/20  Objective: Blood pressure 133/71, pulse 98, temperature 98.5 F (36.9 C), temperature source Oral, resp. rate (!) 21, height 5\' 6"  (1.676 m), weight 126.6 kg, SpO2 91 %.  Intake/Output Summary (Last 24 hours) at 03/11/2019 1555 Last data filed at 03/11/2019 1100 Gross per 24 hour  Intake --  Output 1000 ml  Net -1000 ml   Filed Weights   03/09/19 1646  Weight: 126.6 kg    Examination: General: No acute respiratory distress -alert but mildly confused  Lungs: Faint bilateral crackles with no wheezing Cardiovascular: RRR without murmur or rub Abdomen: Overweight, soft, bowel sounds  positive, no mass Extremities: Trace lower extremity edema bilaterally Neuro: Alert and conversant -follows simple commands -moves all 4 extremities to command -no cranial nerve deficit  CBC: Recent Labs  Lab 03/09/19 1901 03/09/19 2011 03/10/19 0618 03/11/19 0522  WBC 5.9  --  11.2* 11.6*  NEUTROABS  --   --  10.1* 10.0*  HGB 19.8* 12.2 12.0  12.4  HCT 60.3* 36.0 38.1 39.2  MCV 80.7  --  83.7 82.9  PLT 227  --  388 [id number]*   Basic Metabolic Panel: Recent Labs  Lab 03/09/19 1901 03/09/19 2011 03/09/19 2342 03/11/19 0522  NA 139 138  --  150*  K 2.7* 2.6*  --  3.6  CL 99  --   --  109  CO2 20*  --   --  23  GLUCOSE 164*  --   --  240*  BUN 27*  --   --  26*  CREATININE 1.40*  --   --  0.94  CALCIUM 6.9*  --   --  7.7*  MG  --   --  1.4* 2.2   GFR: Estimated Creatinine Clearance: 83.4 mL/min (by C-G formula based on SCr of 0.94 mg/dL).  Liver Function Tests: Recent Labs  Lab 03/09/19 1901 03/11/19 0522  AST 128* 88*  ALT 81* 85*  ALKPHOS 84 98  BILITOT 1.3* 1.2  PROT 8.7* 9.2*  ALBUMIN 3.0* 3.5    Coagulation Profile: Recent Labs  Lab 03/09/19 2012 03/09/19 2342 03/10/19 0618 03/11/19 0522  INR 1.8* 1.9* 1.8* 2.0*    HbA1C: Hgb A1c MFr Bld  Date/Time Value Ref Range Status  03/09/2019 11:44 PM 8.5 (H) 4.8 - 5.6 % Final    Comment:    (NOTE) Pre diabetes:          5.7%-6.4% Diabetes:              >6.4% Glycemic control for   <7.0% adults with diabetes   02/22/2017 12:21 PM 6.8 (H) 4.8 - 5.6 % Final    Comment:    (NOTE) Pre diabetes:          5.7%-6.4% Diabetes:              >6.4% Glycemic control for   <7.0% adults with diabetes     CBG: Recent Labs  Lab 03/10/19 2032 03/10/19 2301 03/11/19 0627 03/11/19 0744 03/11/19 1155  GLUCAP 158* 166* 229* 223* 184*    Recent Results (from the past 240 hour(s))  Blood culture (routine x 2)     Status: None (Preliminary result)   Collection Time: 03/09/19  7:01 PM   Specimen: BLOOD LEFT ARM  Result Value Ref Range Status   Specimen Description BLOOD LEFT ARM UPPER  Final   Special Requests   Final    BOTTLES DRAWN AEROBIC AND ANAEROBIC Blood Culture results may not be optimal due to an inadequate volume of blood received in culture bottles   Culture   Final    NO GROWTH 2 DAYS Performed at [facility], [place] [address],  [place], [place] [address]    Report Status PENDING  Incomplete  Respiratory Panel by RT PCR (Flu A&B, Covid) - Nasopharyngeal Swab     Status: Abnormal   Collection Time: 03/09/19  7:51 PM   Specimen: Nasopharyngeal Swab  Result Value Ref Range Status   SARS Coronavirus 2 by RT PCR POSITIVE (A) NEGATIVE Final    Comment: RESULT CALLED TO, READ BACK BY AND VERIFIED  WITH: RN T [facility] @0026  03/10/19 BY S GEZAHEGN (NOTE) SARS-CoV-2 target nucleic acids are DETECTED. SARS-CoV-2 RNA is generally detectable in upper respiratory specimens  during the acute phase of infection. Positive results are indicative of the presence of the identified virus, but do not rule out bacterial infection or co-infection with other pathogens not detected by the test. Clinical correlation with patient history and other diagnostic information is necessary to determine patient infection status. The expected result is Negative. Fact Sheet for Patients:  PinkCheek.be Fact Sheet for Healthcare Providers: GravelBags.it This test is not yet approved or cleared by the [place] FDA and  has been authorized for detection and/or diagnosis of SARS-CoV-2 by FDA under an Emergency Use Authorization (EUA).  This EUA will remain in effect (meaning this test can be [place] ed) for the duration of  the COVID-19 declaration under Section 564(b)(1) of the Act, 21 U.S.C. section 360bbb-3(b)(1), unless the authorization is terminated or revoked sooner.    Influenza A by PCR NEGATIVE NEGATIVE Final   Influenza B by PCR NEGATIVE NEGATIVE Final    Comment: (NOTE) The Xpert Xpress SARS-CoV-2/FLU/RSV assay is intended as an aid in  the diagnosis of influenza from Nasopharyngeal swab specimens and  should not be used as a sole basis for treatment. Nasal washings and  aspirates are unacceptable for Xpert Xpress SARS-CoV-2/FLU/RSV  testing. Fact Sheet for  Patients: PinkCheek.be Fact Sheet for Healthcare Providers: GravelBags.it This test is not yet approved or cleared by the [place] FDA and  has been authorized for detection and/or diagnosis of SARS-CoV-2 by  FDA under an Emergency Use Authorization (EUA). This EUA will remain  in effect (meaning this test can be used) for the duration of the  Covid-19 declaration under Section 564(b)(1) of the Act, 21  U.S.C. section 360bbb-3(b)(1), unless the authorization is  terminated or revoked. Performed at [facility], [place] [address], [place], [place] [address]      Scheduled Meds: . Chlorhexidine Gluconate Cloth  6 each Topical Daily  . enoxaparin (LOVENOX) injection  130 mg Subcutaneous Q12H  . insulin aspart  0-5 Units Subcutaneous QHS  . insulin aspart  0-9 Units Subcutaneous Q4H  . insulin aspart  3 Units Subcutaneous TID WC  . Ipratropium-Albuterol  2 puff Inhalation Q6H  . magnesium oxide  400 mg Oral BID  . methylPREDNISolone (SOLU-MEDROL) injection  0.5 mg/kg Intravenous Q12H   Continuous Infusions: . ceFEPime (MAXIPIME) IV 2 g (03/11/19 [id number])  . remdesivir 100 mg in NS 100 mL 100 mg (03/11/19 1030)  . vancomycin 1,000 mg (03/10/19 2149)     LOS: 2 days   Cherene Altes, MD [facility] Office  [phone] Pager - Text Page per Amion  If 7PM-7AM, please contact night-coverage per Amion 03/11/2019, 3:55 PM

## 2019-03-10 NOTE — Progress Notes (Signed)
ANTICOAGULATION CONSULT NOTE - Initial Consult  Pharmacy Consult for Warfarin >> Lovenox Indication: History of recurrent DVTs and Lupus Anticoagulant  Allergies  Allergen Reactions  . Lisinopril Swelling    THROAT SWELLING Pt states her throat started to "close up" after being on it for "so long."  . Corticosteroids Rash    States she developed a rash on her tongue after a steroid injection in her bac   Patient Measurements: Height: 5\' 6"  (167.6 cm) Weight: 279 lb 1.6 oz (126.6 kg) IBW/kg (Calculated) : 59.3  Vital Signs: Temp: 99.2 F (37.3 C) (12/21 0650) Temp Source: Oral (12/21 0650) BP: 167/102 (12/21 0900) Pulse Rate: 99 (12/21 0900)  Labs: Recent Labs    03/09/19 1901 03/09/19 2011 03/09/19 2012 03/09/19 2342 03/10/19 0242 03/10/19 0618  HGB 19.8* 12.2  --   --   --  12.0  HCT 60.3* 36.0  --   --   --  38.1  PLT 227  --   --   --   --  388  LABPROT  --   --  20.4* 21.3*  --  20.4*  INR  --   --  1.8* 1.9*  --  1.8*  CREATININE 1.40*  --   --   --   --   --   TROPONINIHS  --   --   --  41* 40*  --     Estimated Creatinine Clearance: 56 mL/min (A) (by C-G formula based on SCr of 1.4 mg/dL (H)).   Medical History: Past Medical History:  Diagnosis Date  . Adrenal mass ([place]) 03/226   Benign  . Arthritis    knees/multiple orthopedic conditons; lower back  . Asthma    per pt  . 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 Depression   . Diabetes mellitus    120s usually fasting -  dx more than 10 yrs ago  . Dizziness, nonspecific   . DVT (deep venous thrombosis) (HCC)    Recurrent  . Fibromyalgia   . GERD (gastroesophageal reflux disease)   . Heart murmur   . History of blood transfusion   . History of cocaine abuse ([place])    Remote history   . Hypertension    takes meds daily  . Hypothyroidism   . Lupus Anticoagulant Positive   . On  home oxygen therapy    at night  . Shortness of breath    exertion or lying flat  . Sleep apnea    2l of oxygen at night (as of 12/6, she used to)     Assessment: 63 y/o F in the ED with altered mental status, on warfarin PTA for hx of DVT, INR is below [place] at 1.8.   Patient was ordered Warfarin 7.5mg  po x1 for yesterday evening but this dose was not given due to patient having shaking episodes and garbled speech. Patient is currently NPO. Stroke work-up was negative.  Per Dr. Louanne Belton, we will change to Lovenox dosing at this time per pharmacy consult while the patient is NPO. SCr is elevated at 1.40 (baseline ~0.7 to 0.9). Normalized CrCl ~46 mL/min. Ok to start Lovenox as INR is < 2.   [place] of Therapy:  Anti-Xa level 0.6-1 units/ml 4hrs after LMWH dose given Monitor platelets by anticoagulation protocol: Yes   Plan:  Start Lovenox 130mg  (~1mg /kg rounded to nearest 10mg ) SQ every  12 hours.  Due to dose required, will check Lovenox level 4 hours after first dose to ensure not too high (this will not be a steady state level) Monitor renal function and need to adjust dosing.  Monitor for bleeding  Sloan Leiter, PharmD, BCPS, BCCCP Clinical Pharmacist Please refer to [facility] for [place] numbers 03/10/2019, 10:39 AM

## 2019-03-10 NOTE — Progress Notes (Signed)
Seen on arrival to [facility]  63 yo with hx asthma, COPD, depression, type 2 diabetes, recurrent DVT on anticoagulation, fibromyalgia, GERD, recent negative stroke work up for right sided weakness who presented with AMS.  See previous notes for additional details with HPI.  Pt daughter said that yesterday she spoke to her mother on the phone, but when she called back was stuttering so she called EMS.  She's been having symptoms of COVID since last Monday with loss of taste, smell, headache, fever.  She presented with AMS and she was admitted to [facility] at [facility].  Today's Vitals   03/10/19 1230 03/10/19 1300 03/10/19 1330 03/10/19 1645  BP: (!) 153/93 (!) 166/97 (!) 162/102 (!) 151/115  Pulse:    (!) 102  Resp: (!) 29 (!) 40  (!) 24  Temp:    (!) 97.3 F (36.3 C)  TempSrc:    Oral  SpO2:    90%  Weight:      Height:      PainSc:    0-No pain   Body mass index is 45.05 kg/m.  Patient appears distressed, mumbling, but mostly incoherent, eventually able to understand Ashlen Kiger bit of what she says "I'm dying, I'm dying, I'm dying" and she asks "am I dying". [place]/AT, PERRL, EOMI mild tachycardia No increased WOB, on 2 L [place] satting ~90% She's able to follow commands, but does this somewhat inconsistently.  CN 2-12 grossly intact.  She has shaking of both arms when asked to raise her arms, worse tremor with movement, it eventually resolves at rest.  Able to raise lower extremities, no tremor noted.  Downgoing toes, no clonus.    Assessment and plan: See note from Dr. Louanne Belton, but in brief  Acute hypoxic resp failure Continue steroids, remdesivir, abx for now (deescalate as appropriate)  Acute Metabolic Encephalopathy Unclear cause.  Possibly 2/2 COVID (covid encephalopathy).  At her most recent hospital stay, there was concern for possible conversion disorder as well, so psychogenic causes would need to be considered as well.  MRI without acute findings.  EEG with mild to moderate diffuse  encephalopathy, nospecific.  Consider neuro vs psych evals going forward if no improvement or worsening. Awaiting speech eval, continue NPO  Rest per Dr. Louanne Belton.

## 2019-03-10 NOTE — Progress Notes (Signed)
Pt. Arrived to room 119, on 2L of O2. Pt is not able to complete sentences. She is shaking and presents to be very scared. Vital sigs taken. Md at bedside. RN spoke with sister, she stated pt was totally independent until last week when she started having covid symptoms. States that pt. Lives alone, uses a walker and drives everywhere. Education and emotional support given per rn. No further questions.

## 2019-03-10 NOTE — ED Notes (Signed)
Pt in MRI at this time.  EEG completed prior to transport.

## 2019-03-10 NOTE — ED Notes (Signed)
Pt speaking in slurred speech and unable to understand, although previously she could hold a conversation briefly.   MD has witnessed her shaking episodes and has been paged as well.

## 2019-03-10 NOTE — ED Notes (Signed)
Daughter Levada Dy called update given.

## 2019-03-10 NOTE — ED Notes (Signed)
Pt returns from MRI and [facility] has been called to transport pt to  [place].   Will be holding for approximately another hour.

## 2019-03-10 NOTE — Procedures (Addendum)
Patient Name: [name]  MRN: [id number]  Epilepsy Attending: Lora Havens  Referring Physician/Provider: Dr. Wonda Amis Date: 03/10/2019 Duration: 25.59 minutes  Patient history: 62 year old female who presented with intermittent right-sided weakness and shaking.  EEG to evaluate for seizures.  Level of alertness: Awake  AEDs during EEG study: None  Technical aspects: This EEG study was done with scalp electrodes positioned according to the 10-20 International system of electrode placement. Electrical activity was acquired at a sampling rate of 500Hz  and reviewed with a high frequency filter of 70Hz  and a low frequency filter of 1Hz . EEG data were recorded continuously and digitally stored.   Description: During awake state, no clear posterior dominant was seen.  EEG showed continuous generalized 3 to 5 Hz theta-delta slowing.  Hyperventilation photic summation were not performed.  Multiple episodes of right and left arm tremor-like movements were captured.  One episode during which patient was moving her head from side to side and had tremor-like movements of mouth was also recorded. Concomitant EEG before during and after the episodes did not show any EEG changes or seizures.  Abnormality - Continuous slow, generalized  IMPRESSION: This study is suggestive of mild to moderate diffuse encephalopathy, nonspecific etiology.  Multiple episodes of left and right arm tremor-like movements as well as mouth movements were captured as described above without EEG change.  These episodes were nonepileptic. No seizures or epileptiform discharges were seen throughout the recording.  [name]

## 2019-03-11 ENCOUNTER — Inpatient Hospital Stay: Payer: Self-pay

## 2019-03-11 LAB — CBC WITH DIFFERENTIAL/PLATELET
Abs Immature Granulocytes: 0.2 10*3/uL — ABNORMAL HIGH (ref 0.00–0.07)
Basophils Absolute: 0.1 10*3/uL (ref 0.0–0.1)
Basophils Relative: 0 %
Eosinophils Absolute: 0 10*3/uL (ref 0.0–0.5)
Eosinophils Relative: 0 %
HCT: 39.2 % (ref 36.0–46.0)
Hemoglobin: 12.4 g/dL (ref 12.0–15.0)
Immature Granulocytes: 2 %
Lymphocytes Relative: 8 %
Lymphs Abs: 0.9 10*3/uL (ref 0.7–4.0)
MCH: 26.2 pg (ref 26.0–34.0)
MCHC: 31.6 g/dL (ref 30.0–36.0)
MCV: 82.9 fL (ref 80.0–100.0)
Monocytes Absolute: 0.4 10*3/uL (ref 0.1–1.0)
Monocytes Relative: 4 %
Neutro Abs: 10 10*3/uL — ABNORMAL HIGH (ref 1.7–7.7)
Neutrophils Relative %: 86 %
Platelets: 519 10*3/uL — ABNORMAL HIGH (ref 150–400)
RBC: 4.73 MIL/uL (ref 3.87–5.11)
RDW: 18.2 % — ABNORMAL HIGH (ref 11.5–15.5)
WBC: 11.6 10*3/uL — ABNORMAL HIGH (ref 4.0–10.5)
nRBC: 1 % — ABNORMAL HIGH (ref 0.0–0.2)

## 2019-03-11 LAB — GLUCOSE, CAPILLARY
Glucose-Capillary: 229 mg/dL — ABNORMAL HIGH (ref 70–99)
Glucose-Capillary: 223 mg/dL — ABNORMAL HIGH (ref 70–99)
Glucose-Capillary: 184 mg/dL — ABNORMAL HIGH (ref 70–99)
Glucose-Capillary: 221 mg/dL — ABNORMAL HIGH (ref 70–99)

## 2019-03-11 LAB — MAGNESIUM: Magnesium: 2.2 mg/dL (ref 1.7–2.4)

## 2019-03-11 LAB — COMPREHENSIVE METABOLIC PANEL
ALT: 85 U/L — ABNORMAL HIGH (ref 0–44)
AST: 88 U/L — ABNORMAL HIGH (ref 15–41)
Albumin: 3.5 g/dL (ref 3.5–5.0)
Alkaline Phosphatase: 98 U/L (ref 38–126)
Anion gap: 18 — ABNORMAL HIGH (ref 5–15)
BUN: 26 mg/dL — ABNORMAL HIGH (ref 8–23)
CO2: 23 mmol/L (ref 22–32)
Calcium: 7.7 mg/dL — ABNORMAL LOW (ref 8.9–10.3)
Chloride: 109 mmol/L (ref 98–111)
Creatinine, Ser: 0.94 mg/dL (ref 0.44–1.00)
GFR calc Af Amer: >60 mL/min (ref >60)
GFR calc non Af Amer: >60 mL/min (ref >60)
Glucose, Bld: 240 mg/dL — ABNORMAL HIGH (ref 70–99)
Potassium: 3.6 mmol/L (ref 3.5–5.1)
Sodium: 150 mmol/L — ABNORMAL HIGH (ref 135–145)
Total Bilirubin: 1.2 mg/dL (ref 0.3–1.2)
Total Protein: 9.2 g/dL — ABNORMAL HIGH (ref 6.5–8.1)

## 2019-03-11 LAB — PROTIME-INR
INR: 2 — ABNORMAL HIGH (ref 0.8–1.2)
Prothrombin Time: 23 seconds — ABNORMAL HIGH (ref 11.4–15.2)

## 2019-03-11 LAB — D-DIMER, QUANTITATIVE: D-Dimer, Quant: 1.11 ug/mL-FEU — ABNORMAL HIGH (ref 0.00–0.50)

## 2019-03-11 LAB — FERRITIN: Ferritin: 327 ng/mL — ABNORMAL HIGH (ref 11–307)

## 2019-03-11 LAB — C-REACTIVE PROTEIN: CRP: 22.6 mg/dL — ABNORMAL HIGH (ref <1.0)

## 2019-03-11 MED ORDER — CHLORHEXIDINE GLUCONATE CLOTH 2 % EX PADS
6.0000 | MEDICATED_PAD | Freq: Every day | CUTANEOUS | Status: DC
  Administered 2019-03-11 – 2019-03-16 (×5): 6 via TOPICAL

## 2019-03-11 MED ORDER — ACETAMINOPHEN 325 MG PO TABS: 650.0000 mg | ORAL_TABLET | Freq: Four times a day (QID) | ORAL | Status: DC | PRN

## 2019-03-11 MED ORDER — INSULIN ASPART 100 UNIT/ML ~~LOC~~ SOLN
0.0000 [IU] | Freq: Three times a day (TID) | SUBCUTANEOUS | Status: DC
  Administered 2019-03-11 – 2019-03-12 (×2): 3 [IU] via SUBCUTANEOUS
  Administered 2019-03-12 (×2): 5 [IU] via SUBCUTANEOUS
  Administered 2019-03-13: 2 [IU] via SUBCUTANEOUS
  Administered 2019-03-13: 3 [IU] via SUBCUTANEOUS
  Administered 2019-03-13 – 2019-03-14 (×2): 2 [IU] via SUBCUTANEOUS
  Administered 2019-03-14: 5 [IU] via SUBCUTANEOUS
  Administered 2019-03-14 – 2019-03-15 (×3): 3 [IU] via SUBCUTANEOUS
  Administered 2019-03-15 – 2019-03-16 (×2): 2 [IU] via SUBCUTANEOUS
  Administered 2019-03-16: 3 [IU] via SUBCUTANEOUS

## 2019-03-11 MED ORDER — LIP MEDEX EX OINT
TOPICAL_OINTMENT | CUTANEOUS | Status: DC | PRN
  Filled 2019-03-11: qty 7

## 2019-03-11 MED ORDER — IPRATROPIUM-ALBUTEROL 20-100 MCG/ACT IN AERS
2.0000 | INHALATION_SPRAY | Freq: Four times a day (QID) | RESPIRATORY_TRACT | Status: DC
  Administered 2019-03-11 – 2019-03-16 (×17): 2 via RESPIRATORY_TRACT
  Filled 2019-03-11: qty 4

## 2019-03-11 MED ORDER — DEXAMETHASONE SODIUM PHOSPHATE 10 MG/ML IJ SOLN
6.0000 mg | INTRAMUSCULAR | Status: DC
  Administered 2019-03-12 – 2019-03-16 (×5): 6 mg via INTRAVENOUS
  Filled 2019-03-11 (×5): qty 1

## 2019-03-11 MED ORDER — INSULIN ASPART 100 UNIT/ML ~~LOC~~ SOLN
5.0000 [IU] | Freq: Three times a day (TID) | SUBCUTANEOUS | Status: DC
  Administered 2019-03-11 – 2019-03-16 (×15): 5 [IU] via SUBCUTANEOUS

## 2019-03-11 NOTE — Progress Notes (Signed)
Spoke with on call provider (Dr. Avon Gully) r/t elevated blood pressures. Unable to determine if most recent blood pressures are true readings or interference second to tremors

## 2019-03-11 NOTE — Progress Notes (Signed)
Updated both Sister marylynn and daughter Janace Hoard on pt care.

## 2019-03-11 NOTE — Evaluation (Signed)
Clinical/Bedside Swallow Evaluation Patient Details  Name: [name] MRN: [id number] Date of Birth: 1955-06-19  Today's Date: 03/11/2019 Time: SLP Start Time (ACUTE ONLY): 1204 SLP Stop Time (ACUTE ONLY): 1218 SLP Time Calculation (min) (ACUTE ONLY): 14 min  Past Medical History:  Past Medical History:  Diagnosis Date  . Adrenal mass (HCC) 03/226   Benign  . Arthritis    knees/multiple orthopedic conditons; lower back  . Asthma    per pt  . 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 Depression   . Diabetes mellitus    120s usually fasting -  dx more than 10 yrs ago  . Dizziness, nonspecific   . DVT (deep venous thrombosis) (HCC)    Recurrent  . Fibromyalgia   . GERD (gastroesophageal reflux disease)   . Heart murmur   . History of blood transfusion   . History of cocaine abuse (HCC)    Remote history   . Hypertension    takes meds daily  . Hypothyroidism   . Lupus Anticoagulant Positive   . On home oxygen therapy    at night  . Shortness of breath    exertion or lying flat  . Sleep apnea    2l of oxygen at night (as of 12/6, she used to)   Past Surgical History:  Past Surgical History:  Procedure Laterality Date  . ABDOMINAL HYSTERECTOMY  1989   Fibroids  . ANTERIOR LUMBAR FUSION  01/03/2012   Procedure: ANTERIOR LUMBAR FUSION 1 LEVEL;  Surgeon: Carmela Hurt, MD;  Location: [facility];  Service: Neurosurgery;  Laterality: N/A;  Lumbar Four-Five Anterior Lumbar Interbody Fusion with Instrumentation  . BACK SURGERY     cervical spine---disk disease  . CARPAL TUNNEL RELEASE     Bilateral  . CESAREAN SECTION    . CHOLECYSTECTOMY    . CYSTOSCOPY/RETROGRADE/URETEROSCOPY/STONE EXTRACTION WITH BASKET Right 04/26/2014   Procedure: CYSTOSCOPY/ RIGHT RETROGRADE/ RIGHT URETEROSCOPY/URETERAL AND RENAL PELVIS BIOPSY;  Surgeon: Sebastian Ache, MD;  Location:  [facility];  Service: Urology;  Laterality: Right;  . gallstones removed    . 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   bilateral  . right elbow surgery    . THYROID LOBECTOMY Right 02/27/2017  . THYROID LOBECTOMY Right 02/27/2017   Procedure: RIGHT THYROID LOBECTOMY;  Surgeon: Harriette Bouillon, MD;  Location: [facility] OR;  Service: General;  Laterality: Right;  . TUBAL LIGATION     HPI:  Pt is a 63 yo female admitted with AMS after several days of COVID symptoms. Pt had a recent stroke w/u that was negative. MRI brain on 12/21 negative. 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, fibromyalgia, GERD   Assessment / Plan / Recommendation Clinical Impression  Pt has mild right-sided facial weakness during oral motor exam that was not appreciated during functional PO intake. She has no anterior spillage and although she has mild pocketing, it is actually on her left side. Min cues were given for a lingual sweep. Suspect that mild residue and prolonged mastication may be in part related to xerostomia and in part her mentation. Recommend Dys 3 diet and thin liquids. SLP will continue to follow for tolerance.  SLP Visit Diagnosis: Dysphagia, unspecified (R13.10)    Aspiration Risk  Mild aspiration risk    Diet Recommendation Dysphagia 3 (Mech soft);Thin liquid   Liquid Administration via: Cup;Straw Medication Administration:  Whole meds with liquid Supervision: Patient able to self feed;Full supervision/cueing for compensatory strategies Compensations: Minimize environmental distractions;Slow rate;Small sips/bites Postural Changes: Seated upright at 90 degrees;Remain upright for at least 30 minutes after po intake    Other  Recommendations Oral Care Recommendations: Oral care BID   Follow up Recommendations 24 hour supervision/assistance      Frequency and Duration min 2x/week  2 weeks       Prognosis Prognosis for Safe Diet Advancement: Good Barriers to Reach  Goals: Cognitive deficits      Swallow Study   General HPI: Pt is a 63 yo female admitted with AMS after several days of COVID symptoms. Pt had a recent stroke w/u that was negative. MRI brain on 12/21 negative. 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, fibromyalgia, GERD Type of Study: Bedside Swallow Evaluation Previous Swallow Assessment: none in chart  Diet Prior to this Study: NPO Temperature Spikes Noted: No Respiratory Status: Nasal cannula History of Recent Intubation: No Behavior/Cognition: Alert;Cooperative;Requires cueing Oral Cavity Assessment: Within Functional Limits Oral Care Completed by SLP: No Oral Cavity - Dentition: Adequate natural dentition Vision: Functional for self-feeding Self-Feeding Abilities: Able to feed self;Needs set up Patient Positioning: Upright in bed Baseline Vocal Quality: Normal Volitional Swallow: Able to elicit    Oral/Motor/Sensory Function Overall Oral Motor/Sensory Function: Mild impairment Facial Symmetry: Abnormal symmetry right Facial Strength: Within Functional Limits Lingual ROM: Within Functional Limits Lingual Symmetry: Within Functional Limits Lingual Strength: Within Functional Limits Mandible: Within Functional Limits   Ice Chips Ice chips: Not tested   Thin Liquid Thin Liquid: Impaired Presentation: Cup;Self Fed;Straw Pharyngeal  Phase Impairments: Throat Clearing - Immediate(after first sip)    Nectar Thick Nectar Thick Liquid: Not tested   Honey Thick Honey Thick Liquid: Not tested   Puree Puree: Within functional limits Presentation: Self Fed;Spoon   Solid     Solid: Impaired Presentation: Self Fed Oral Phase Impairments: Impaired mastication Oral Phase Functional Implications: Oral residue      [name] 03/11/2019,1:21 PM  Ivar Drape, M.A. CCC-SLP Acute Herbalist [phone] Office [phone]

## 2019-03-11 NOTE — Progress Notes (Signed)
Inpatient Diabetes Program Recommendations  AACE/ADA: New Consensus Statement on Inpatient Glycemic Control (2015)  Target Ranges:  Prepandial:   less than 140 mg/dL      Peak postprandial:   less than 180 mg/dL (1-2 hours)      Critically ill patients:  140 - 180 mg/dL   Lab Results  Component Value Date   GLUCAP 223 (H) 03/11/2019   HGBA1C 8.5 (H) 03/09/2019    Review of Glycemic Control Results for ORNELLA, HARTFIELD (MRN [id number]) as of 03/11/2019 12:00  Ref. Range 03/10/2019 20:32 03/10/2019 23:01 03/11/2019 06:27 03/11/2019 07:44  Glucose-Capillary Latest Ref Range: 70 - 99 mg/dL 158 (H) 166 (H) 229 (H) 223 (H)   Diabetes history: Type 2 DM Outpatient Diabetes medications: Invokana 100 mg every day, Metformin 500 mg QHS Current orders for Inpatient glycemic control: Novolog 0-9 units TID, Novolog 0-5 units at bedtime, Novolog 3 units TID,  Solumedrol 126.6 kg BID  Inpatient Diabetes Program Recommendations:    Consider increasing correction to Novolog 0-20 units TID.   Thanks, Bronson Curb, MSN, RNC-OB Diabetes Coordinator [phone] (8a-5p)

## 2019-03-11 NOTE — Progress Notes (Signed)
Contacted MD regarding pt with no output throughout the night after bladder scan pt retaining >623ml with elevated BP and altered mental status. Notified of MEWS score. Please see orders for new orders received by MD. Will continue to monitor.

## 2019-03-11 NOTE — Progress Notes (Signed)
Late Entry for 03/10/19 @ 1948: Patient seen and assessed. Physical assessment completed via computerized charting per [facility] policy. Patient alert, oriented to self only. Had to orient to place, time. Tremor noted to bilateral upper arms during assessment. Pupils equal, reactive to light. Aspiration precautions in place. Nothing by mouth order in place. Patient continues to ask for water and ice. Had to remind patient that she can not have anything by mouth. No family present at bedside. Side rails up x 2. Bed in lowest position and locked.

## 2019-03-11 NOTE — Progress Notes (Signed)
Peripherally Inserted Central Catheter/Midline Placement  The IV Nurse has discussed with the patient and/or persons authorized to consent for the patient, the purpose of this procedure and the potential benefits and risks involved with this procedure.  The benefits include less needle sticks, lab draws from the catheter, and the patient may be discharged home with the catheter. Risks include, but not limited to, infection, bleeding, blood clot (thrombus formation), and puncture of an artery; nerve damage and irregular heartbeat and possibility to perform a PICC exchange if needed/ordered by physician.  Alternatives to this procedure were also discussed.  Bard Power PICC patient education guide, fact sheet on infection prevention and patient information card has been provided to patient /or left at bedside.    PICC/Midline Placement Documentation  PICC Double Lumen 03/11/19 PICC Right Cephalic 43 cm 1 cm (Active)  Indication for Insertion or Continuance of Line Prolonged intravenous therapies 03/11/19 0900  Exposed Catheter (cm) 1 cm 03/11/19 0900  Site Assessment Clean;Dry;Intact 03/11/19 0900  Lumen #1 Status Infusing;Flushed;Blood return noted 03/11/19 0900  Lumen #2 Status Infusing;Flushed;Blood return noted 03/11/19 0900  Dressing Type Transparent;Securing device 03/11/19 0900  Dressing Status Clean;Dry;Intact;Antimicrobial disc in place 03/11/19 0900  Line Care Connections checked and tightened 03/11/19 0900  Dressing Intervention New dressing;Other (Comment) 03/11/19 0900  Dressing Change Due 03/18/19 03/11/19 0900       Virgilio Belling 03/11/2019, 9:59 AM

## 2019-03-12 ENCOUNTER — Other Ambulatory Visit: Payer: Medicare Other

## 2019-03-12 DIAGNOSIS — I5032 Chronic diastolic (congestive) heart failure: Secondary | ICD-10-CM

## 2019-03-12 LAB — GLUCOSE, CAPILLARY
Glucose-Capillary: 206 mg/dL — ABNORMAL HIGH (ref 70–99)
Glucose-Capillary: 228 mg/dL — ABNORMAL HIGH (ref 70–99)
Glucose-Capillary: 280 mg/dL — ABNORMAL HIGH (ref 70–99)
Glucose-Capillary: 264 mg/dL — ABNORMAL HIGH (ref 70–99)

## 2019-03-12 LAB — COMPREHENSIVE METABOLIC PANEL
ALT: 72 U/L — ABNORMAL HIGH (ref 0–44)
AST: 52 U/L — ABNORMAL HIGH (ref 15–41)
Albumin: 3.1 g/dL — ABNORMAL LOW (ref 3.5–5.0)
Alkaline Phosphatase: 88 U/L (ref 38–126)
Anion gap: 14 (ref 5–15)
BUN: 32 mg/dL — ABNORMAL HIGH (ref 8–23)
CO2: 24 mmol/L (ref 22–32)
Calcium: 7.8 mg/dL — ABNORMAL LOW (ref 8.9–10.3)
Chloride: 112 mmol/L — ABNORMAL HIGH (ref 98–111)
Creatinine, Ser: 1.06 mg/dL — ABNORMAL HIGH (ref 0.44–1.00)
GFR calc Af Amer: >60 mL/min (ref >60)
GFR calc non Af Amer: 56 mL/min — ABNORMAL LOW (ref >60)
Glucose, Bld: 212 mg/dL — ABNORMAL HIGH (ref 70–99)
Potassium: 3.5 mmol/L (ref 3.5–5.1)
Sodium: 150 mmol/L — ABNORMAL HIGH (ref 135–145)
Total Bilirubin: 0.9 mg/dL (ref 0.3–1.2)
Total Protein: 8.8 g/dL — ABNORMAL HIGH (ref 6.5–8.1)

## 2019-03-12 LAB — CBC WITH DIFFERENTIAL/PLATELET
Abs Immature Granulocytes: 0.16 10*3/uL — ABNORMAL HIGH (ref 0.00–0.07)
Basophils Absolute: 0.1 10*3/uL (ref 0.0–0.1)
Basophils Relative: 1 %
Eosinophils Absolute: 0 10*3/uL (ref 0.0–0.5)
Eosinophils Relative: 0 %
HCT: 37.8 % (ref 36.0–46.0)
Hemoglobin: 12 g/dL (ref 12.0–15.0)
Immature Granulocytes: 2 %
Lymphocytes Relative: 9 %
Lymphs Abs: 0.8 10*3/uL (ref 0.7–4.0)
MCH: 26.3 pg (ref 26.0–34.0)
MCHC: 31.7 g/dL (ref 30.0–36.0)
MCV: 82.9 fL (ref 80.0–100.0)
Monocytes Absolute: 0.7 10*3/uL (ref 0.1–1.0)
Monocytes Relative: 8 %
Neutro Abs: 7.2 10*3/uL (ref 1.7–7.7)
Neutrophils Relative %: 80 %
Platelets: 443 10*3/uL — ABNORMAL HIGH (ref 150–400)
RBC: 4.56 MIL/uL (ref 3.87–5.11)
RDW: 18.2 % — ABNORMAL HIGH (ref 11.5–15.5)
WBC: 9 10*3/uL (ref 4.0–10.5)
nRBC: 1.6 % — ABNORMAL HIGH (ref 0.0–0.2)

## 2019-03-12 LAB — BASIC METABOLIC PANEL
Anion gap: 13 (ref 5–15)
BUN: 36 mg/dL — ABNORMAL HIGH (ref 8–23)
CO2: 22 mmol/L (ref 22–32)
Calcium: 7.4 mg/dL — ABNORMAL LOW (ref 8.9–10.3)
Chloride: 114 mmol/L — ABNORMAL HIGH (ref 98–111)
Creatinine, Ser: 1 mg/dL (ref 0.44–1.00)
GFR calc Af Amer: >60 mL/min (ref >60)
GFR calc non Af Amer: 60 mL/min — ABNORMAL LOW (ref >60)
Glucose, Bld: 228 mg/dL — ABNORMAL HIGH (ref 70–99)
Potassium: 3 mmol/L — ABNORMAL LOW (ref 3.5–5.1)
Sodium: 149 mmol/L — ABNORMAL HIGH (ref 135–145)

## 2019-03-12 LAB — C-REACTIVE PROTEIN: CRP: 13.4 mg/dL — ABNORMAL HIGH (ref <1.0)

## 2019-03-12 LAB — PROCALCITONIN: Procalcitonin: <0.1 ng/mL

## 2019-03-12 LAB — FERRITIN: Ferritin: 337 ng/mL — ABNORMAL HIGH (ref 11–307)

## 2019-03-12 LAB — D-DIMER, QUANTITATIVE: D-Dimer, Quant: 0.66 ug/mL-FEU — ABNORMAL HIGH (ref 0.00–0.50)

## 2019-03-12 LAB — PROTIME-INR
INR: 2.2 — ABNORMAL HIGH (ref 0.8–1.2)
Prothrombin Time: 24.2 seconds — ABNORMAL HIGH (ref 11.4–15.2)

## 2019-03-12 LAB — HEPARIN ANTI-XA: Heparin LMW: 1.48 IU/mL

## 2019-03-12 LAB — MAGNESIUM: Magnesium: 2.3 mg/dL (ref 1.7–2.4)

## 2019-03-12 MED ORDER — PRAVASTATIN SODIUM 40 MG PO TABS
40.0000 mg | ORAL_TABLET | Freq: Every evening | ORAL | Status: DC
  Administered 2019-03-12 – 2019-03-15 (×4): 40 mg via ORAL
  Filled 2019-03-12 (×4): qty 1

## 2019-03-12 MED ORDER — DULOXETINE HCL 60 MG PO CPEP
60.0000 mg | ORAL_CAPSULE | Freq: Every day | ORAL | Status: DC
  Administered 2019-03-12 – 2019-03-15 (×4): 60 mg via ORAL
  Filled 2019-03-12 (×5): qty 1

## 2019-03-12 MED ORDER — ENOXAPARIN SODIUM 80 MG/0.8ML ~~LOC~~ SOLN: 65.0000 mg | Freq: Two times a day (BID) | SUBCUTANEOUS | Status: DC

## 2019-03-12 MED ORDER — WARFARIN SODIUM 2.5 MG PO TABS
2.5000 mg | ORAL_TABLET | Freq: Once | ORAL | Status: AC
  Administered 2019-03-12: 2.5 mg via ORAL
  Filled 2019-03-12: qty 1

## 2019-03-12 MED ORDER — AMLODIPINE BESYLATE 5 MG PO TABS
5.0000 mg | ORAL_TABLET | Freq: Every day | ORAL | Status: DC
  Administered 2019-03-12 – 2019-03-16 (×5): 5 mg via ORAL
  Filled 2019-03-12 (×6): qty 1

## 2019-03-12 MED ORDER — WARFARIN - PHARMACIST DOSING INPATIENT: Freq: Every day | Status: DC

## 2019-03-12 MED ORDER — CLONIDINE HCL 0.1 MG PO TABS
0.1000 mg | ORAL_TABLET | Freq: Two times a day (BID) | ORAL | Status: DC
  Administered 2019-03-12 – 2019-03-16 (×9): 0.1 mg via ORAL
  Filled 2019-03-12 (×9): qty 1

## 2019-03-12 MED ORDER — METOPROLOL TARTRATE 5 MG/5ML IV SOLN
5.0000 mg | Freq: Four times a day (QID) | INTRAVENOUS | Status: DC
  Administered 2019-03-12 – 2019-03-16 (×15): 5 mg via INTRAVENOUS
  Filled 2019-03-12 (×15): qty 5

## 2019-03-12 MED ORDER — DULOXETINE HCL 20 MG PO CPEP
20.0000 mg | ORAL_CAPSULE | Freq: Every day | ORAL | Status: DC
  Administered 2019-03-12 – 2019-03-16 (×5): 20 mg via ORAL
  Filled 2019-03-12 (×5): qty 1

## 2019-03-12 MED ORDER — SODIUM CHLORIDE 0.45 % IV SOLN: INTRAVENOUS | Status: DC

## 2019-03-12 NOTE — Progress Notes (Signed)
Late Entry for 03/11/19 @ 1953. Patient seen and assessed. Physical assessment completed via computerized charting per [facility] policy. Patient found sitting in bed watching PM television. Patient alert to self only; had to orient to place and time. Right upper arm PICC noted during assessment (red and purple lumens). Both lumens flushed with 10 ml Normal Saline with positive blood return noted to both lumens. Side rails up times 3. Bed in lowest position and locked. Call light in reach. No family present at bedside.

## 2019-03-12 NOTE — Progress Notes (Signed)
Tele contacted writer stated pt had 8 beats of vtach. Pt currenting with eyes open, responsive stating she has no pain. Writer contacted MD. Awaiting orders.

## 2019-03-12 NOTE — Progress Notes (Signed)
ANTICOAGULATION CONSULT NOTE   Pharmacy Consult for Warfarin Indication: History of recurrent DVTs and Lupus Anticoagulant  Patient Measurements: Height: 5\' 6"  (167.6 cm) Weight: 279 lb 1.6 oz (126.6 kg) IBW/kg (Calculated) : 59.3  Vital Signs: Temp: 98.2 F (36.8 C) (12/23 0442) Temp Source: Oral (12/23 0442) BP: 157/81 (12/23 0800) Pulse Rate: 101 (12/23 0800)  Labs: Recent Labs    03/09/19 1901 03/09/19 2011 03/09/19 2342 03/10/19 0242 03/10/19 0618 03/11/19 0522 03/12/19 0203  HGB 19.8*  --   --   --  12.0 12.4 12.0  HCT 60.3*  --   --   --  38.1 39.2 37.8  PLT 227  --   --   --  388 519* 443*  LABPROT  --    < > 21.3*  --  20.4* 23.0* 24.2*  INR  --    < > 1.9*  --  1.8* 2.0* 2.2*  HEPRLOWMOCWT  --   --   --   --   --   --  1.48  CREATININE 1.40*  --   --   --   --  0.94 1.06*  TROPONINIHS  --   --  41* 40*  --   --   --    < > = values in this interval not displayed.    Estimated Creatinine Clearance: 73.9 mL/min (A) (by C-G formula based on SCr of 1.06 mg/dL (H)).    Assessment: 63 y/o F in the ED with altered mental status, on warfarin PTA for hx of DVT. Admit INR 1.9 and a lovenox bridge was started. The patient's INR has trended up to 2.2 today and pharmacy has been consulted to discontinue lovenox and resume Warfarin.   PTA Warfarin dose 5 mg daily EXCEPT for 10 mg on MWF  INR up to 2.2 today despite holding Warfarin doses. Last dose per PTA med list was on 12/19. The patient did receive 1 dose of Flagyl on 12/20 which is known to increase Warfarin sensitivity. Will resume with a low dose and monitor INR trends closely.   [place] of Therapy:  INR 2-3 Monitor platelets by anticoagulation protocol: Yes   Plan:  - Discontinue Lovenox - Warfarin 2.5 mg x 1 dose at 1800 today - Daily PT/INR, CBC q72h - Will continue to monitor for any signs/symptoms of bleeding and will follow up with PT/INR in the a.m.    Thank you for allowing pharmacy to be a part of  this patient's care.  Alycia Rossetti, PharmD, BCPS Clinical Pharmacist Clinical phone for 03/12/2019: [phone] 03/12/2019 8:58 AM   **Pharmacist phone directory can now be found on [email or web address] ([email or web address]).  Listed under [place].

## 2019-03-12 NOTE — Progress Notes (Addendum)
On call provider notified of most recent vital signs that indicate RED [place] second to tachypnea, tachycardia and elevated BP (SBP 160). Suggestion made to add prn blood pressure medication to regimen   MD conversation 0411 03/12/19: On call provider returns call. Update given on patient most recent vital signs and Red MEWS. Informed MD that patient has been tachypnea and tachycardia at times for 1900-0700 shift. No acute distress noted. Oxygen sats remain 93% on room air. Disclosed that patient was on Norvasc at home. Instructed to given PO Norvasc now. Will execute order.   Update on patient condition 03/12/19 @ 0628: On call provider updated second to reassessment of vital signs after Norvasc given.BP decreased to 252/98, HR 109, RR 24.   03/12/19  @ 0648: Spoke with patient sister, Carlyon Prisbrey [phone]). Update given on condition. Explained that patient elevated blood pressure persists,and a home blood pressure medication (Norvaac) was added. Sister informs staff RN that patient is "on 2 blood pressure pills" but cant remember names. Informed that PICC line was placed on day shift and continues to be in use, and a foley was placed second to urinary retention. Sister says that doctors " had to open her up to help her pee" Patient also confirms that patient has PMH of Lupus. Sister states that she will talk with pt daughter to obtain a medication list. Staff nurse states that per chart, pharmacy of choice is [facility] on [address] Sister confirms.   03/12/19 @ 0659: Attempt to call [facility] ( [address] location [phone]) unsuccessful to obtain information related to blood pressure medicines  03/12/19 0703: On call provider notified of speaking with patient's sister r/t blood pressure meds x 2. Sister unsure of second BP med. Pharmacies of choice are [facility] and [facility] ([address]

## 2019-03-12 NOTE — Progress Notes (Signed)
Notified MD regard pt elevated BP/respirations and mentation. Please see new orders.

## 2019-03-12 NOTE — Progress Notes (Signed)
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  At that time, she was found to be acutely hypoxic with fever and Covid pneumonia.  Patient started on Remdisivir & Solu-Medrol.  she was transferred on 12/21 over to [facility].  Since admission, she has remained oriented to person only.  She follows commands and answer some questions appropriately.  Sodium noted since admission to be elevated at 150 as well as persistently elevated high blood pressure.  CT scan of the head repeatedly has been negative.  This morning, patient doing okay.  She is quiet with limited responses.  Sodium stayed elevated now x2 days at 150.  She is off of oxygen currently  Assessment/Plan: Principal Problem:   Acute respiratory failure with hypoxia (HCC) secondary to COVID-19 infection plus COPD with exacerbation: Continue steroids, supplemental oxygen, Remdisivir.  No evidence of bacterial pneumonia, no antibiotics.  Able to be weaned off of oxygen Active Problems:   COPD with acute exacerbation (HCC)   Sepsis ([place])   Suspected COVID-19 virus infection   Acute encephalopathy multiple etiologies.  It is possible this could be from mood disorder and have since restarted her Cymbalta and Seroquel.  Also, could be from hypertensive encephalopathy and so have started as needed Lopressor plus her home medications.  Possible this could be from hypoxia and Covid which we are treating as well.  We will also consider hypernatremia.  CT scan of the head  negative.  Hypernatremia: This could be a contributing factor to her confusion although her hyponatremia is mild at best.  Have started her on half-normal saline.  Repeat sodium check this afternoon.  Chronic diastolic heart failure: Incidentally noted on echocardiogram earlier this month.  Appears relatively euvolemic with BNP normal on admission.  Now that she is getting IV fluids, follow daily weight.  Uncontrolled diabetes mellitus type 2 with secondary stage II CKD: Looks to be at baseline.:  On sliding scale, follow CBGs.  A1c at 8.5  Morbid obesity: Patient meets criteria with BMI greater than 40.  Malignant hypertension: Has remained persistently elevated.  This may actually slightly go up with her getting some gentle IV fluids.  Have started as needed Lopressor.  Clarify her home medications.  Recurrent DVT on chronic anticoagulation: On Coumadin, INR therapeutic  Code Status: Full code  Family Communication: Updated family by phone  Disposition Plan: Home once mentation improves  Consultants:  None  Procedures:  None  Antimicrobials:  IV cefepime and vancomycin 12/20-12/22, Flagyl 12/20 x 1 dose  DVT prophylaxis: Coumadin   Objective: Vitals:   03/12/19 1200 03/12/19 1230  BP: (!) 153/96 (!) 143/68  Pulse:    Resp: (!) 35 (!) 25  Temp:    SpO2:      Intake/Output Summary (Last 24 hours) at 03/12/2019 1304 Last data filed at 03/12/2019 0808 Gross per 24 hour  Intake 1470 ml  Output 600 ml  Net 870 ml   Filed Weights   03/09/19 1646  Weight: 126.6 kg   Body mass index is 45.05 kg/m.  Exam:   General:  Awake, but drowsy.  Oriented x 1  HEENT: Normocephalic, atraumatic, mucous membranes slightly dry  Neck: Thick, narrow airway  Cardiovascular: Regular rate and rhythm, S1-S2  Respiratory: Decreased breath sounds throughout  Abdomen: Soft, obese, nontender, hypoactive bowel sounds  Musculoskeletal: No clubbing or cyanosis, trace pitting  edema  Skin: No skin breaks, tears or lesions  Psychiatry: Flattened affect   Data Reviewed: CBC: Recent Labs  Lab 03/09/19 1901 03/09/19 2011 03/10/19 0618 03/11/19 0522 03/12/19 0203  WBC 5.9  --  11.2* 11.6* 9.0  NEUTROABS  --   --  10.1* 10.0* 7.2  HGB 19.8* 12.2 12.0 12.4 12.0  HCT 60.3* 36.0 38.1 39.2 37.8  MCV 80.7  --  83.7 82.9 82.9  PLT 227  --  388 519* 123456*   Basic Metabolic Panel: Recent Labs  Lab 03/09/19 1901 03/09/19 2011 03/09/19 2342 03/11/19 0522 03/12/19 0203  NA 139 138  --  150* 150*  K 2.7* 2.6*  --  3.6 3.5  CL 99  --   --  109 112*  CO2 20*  --   --  23 24  GLUCOSE 164*  --   --  240* 212*  BUN 27*  --   --  26* 32*  CREATININE 1.40*  --   --  0.94 1.06*  CALCIUM 6.9*  --   --  7.7* 7.8*  MG  --   --  1.4* 2.2 2.3   GFR: Estimated Creatinine Clearance: 73.9 mL/min (A) (by C-G formula based on SCr of 1.06 mg/dL (H)). Liver Function Tests: Recent Labs  Lab 03/09/19 1901 03/11/19 0522 03/12/19 0203  AST 128* 88* 52*  ALT 81* 85* 72*  ALKPHOS 84 98 88  BILITOT 1.3* 1.2 0.9  PROT 8.7* 9.2* 8.8*  ALBUMIN 3.0* 3.5 3.1*   No results for input(s): LIPASE, AMYLASE in the last 168 hours. No results for input(s): AMMONIA in the last 168 hours. Coagulation Profile: Recent Labs  Lab 03/09/19 2012 03/09/19 2342 03/10/19 0618 03/11/19 0522 03/12/19 0203  INR 1.8* 1.9* 1.8* 2.0* 2.2*   Cardiac Enzymes: No results for input(s): CKTOTAL, CKMB, CKMBINDEX, TROPONINI in the last 168 hours. BNP (last 3 results) No results for input(s): PROBNP in the last 8760 hours. HbA1C: Recent Labs    03/09/19 2344  HGBA1C 8.5*   CBG: Recent Labs  Lab 03/11/19 1155 03/11/19 1605 03/11/19 2228 03/12/19 0736 03/12/19 1134  GLUCAP 184* 221* 206* 228* 280*   Lipid Profile: No results for input(s): CHOL, HDL, LDLCALC, TRIG, CHOLHDL, LDLDIRECT in the last 72 hours. Thyroid Function Tests: No results for input(s): TSH, T4TOTAL, FREET4, T3FREE,  THYROIDAB in the last 72 hours. Anemia Panel: Recent Labs    03/11/19 0425 03/12/19 0203  FERRITIN 327* 337*   Urine analysis:    Component Value Date/Time   COLORURINE AMBER (A) 03/09/2019 1918   APPEARANCEUR HAZY (A) 03/09/2019 1918   LABSPEC 1.021 03/09/2019 1918   PHURINE 5.0 03/09/2019 1918   GLUCOSEU NEGATIVE 03/09/2019 1918   HGBUR MODERATE (A) 03/09/2019 1918   BILIRUBINUR NEGATIVE 03/09/2019 1918   KETONESUR 20 (A) 03/09/2019 1918   PROTEINUR 100 (A) 03/09/2019 1918   UROBILINOGEN 0.2 06/12/2014 2054   NITRITE NEGATIVE 03/09/2019 1918   LEUKOCYTESUR NEGATIVE 03/09/2019 1918   Sepsis Labs: @LABRCNTIP (procalcitonin:4,lacticidven:4)  ) Recent Results (from the past 240 hour(s))  Blood culture (routine x 2)     Status: None (Preliminary result)   Collection Time: 03/09/19  7:01 PM   Specimen:  BLOOD LEFT ARM  Result Value Ref Range Status   Specimen Description BLOOD LEFT ARM UPPER  Final   Special Requests   Final    BOTTLES DRAWN AEROBIC AND ANAEROBIC Blood Culture results may not be optimal due to an inadequate volume of blood received in culture bottles   Culture   Final    NO GROWTH 3 DAYS Performed at [facility], [place] [address], [place], [place] [address]    Report Status PENDING  Incomplete  Respiratory Panel by RT PCR (Flu A&B, Covid) - Nasopharyngeal Swab     Status: Abnormal   Collection Time: 03/09/19  7:51 PM   Specimen: Nasopharyngeal Swab  Result Value Ref Range Status   SARS Coronavirus 2 by RT PCR POSITIVE (A) NEGATIVE Final    Comment: RESULT CALLED TO, READ BACK BY AND VERIFIED WITH: RN T [facility] @0026  03/10/19 BY S GEZAHEGN (NOTE) SARS-CoV-2 target nucleic acids are DETECTED. SARS-CoV-2 RNA is generally detectable in upper respiratory specimens  during the acute phase of infection. Positive results are indicative of the presence of the identified virus, but do not rule out bacterial infection or co-infection with other pathogens  not detected by the test. Clinical correlation with patient history and other diagnostic information is necessary to determine patient infection status. The expected result is Negative. Fact Sheet for Patients:  PinkCheek.be Fact Sheet for Healthcare Providers: GravelBags.it This test is not yet approved or cleared by the [place] FDA and  has been authorized for detection and/or diagnosis of SARS-CoV-2 by FDA under an Emergency Use Authorization (EUA).  This EUA will remain in effect (meaning this test can be [place] ed) for the duration of  the COVID-19 declaration under Section 564(b)(1) of the Act, 21 U.S.C. section 360bbb-3(b)(1), unless the authorization is terminated or revoked sooner.    Influenza A by PCR NEGATIVE NEGATIVE Final   Influenza B by PCR NEGATIVE NEGATIVE Final    Comment: (NOTE) The Xpert Xpress SARS-CoV-2/FLU/RSV assay is intended as an aid in  the diagnosis of influenza from Nasopharyngeal swab specimens and  should not be used as a sole basis for treatment. Nasal washings and  aspirates are unacceptable for Xpert Xpress SARS-CoV-2/FLU/RSV  testing. Fact Sheet for Patients: PinkCheek.be Fact Sheet for Healthcare Providers: GravelBags.it This test is not yet approved or cleared by the [place] FDA and  has been authorized for detection and/or diagnosis of SARS-CoV-2 by  FDA under an Emergency Use Authorization (EUA). This EUA will remain  in effect (meaning this test can be used) for the duration of the  Covid-19 declaration under Section 564(b)(1) of the Act, 21  U.S.C. section 360bbb-3(b)(1), unless the authorization is  terminated or revoked. Performed at [facility], [place] [address], [place], [place] [address]       Studies: No results found.  Scheduled Meds: . amLODipine  5 mg Oral Daily  . Chlorhexidine Gluconate Cloth   6 each Topical Daily  . dexamethasone (DECADRON) injection  6 mg Intravenous Q24H  . insulin aspart  0-5 Units Subcutaneous QHS  . insulin aspart  0-9 Units Subcutaneous TID WC  . insulin aspart  5 Units Subcutaneous TID WC  . Ipratropium-Albuterol  2 puff Inhalation QID  . magnesium oxide  400 mg Oral BID  . metoprolol tartrate  5 mg Intravenous Q6H  . warfarin  2.5 mg Oral ONCE-1800  . Warfarin - Pharmacist Dosing Inpatient   Does not apply q1800    Continuous  Infusions: . sodium chloride 75 mL/hr at 03/12/19 0955  . remdesivir 100 mg in NS 100 mL 100 mg (03/12/19 0808)     LOS: 3 days     Annita Brod, MD [facility]  To reach me or the doctor on call, go to: [email or web address] Password [email or web address]  03/12/2019, 1:04 PM

## 2019-03-12 NOTE — Progress Notes (Signed)
Speech Language Pathology Treatment: Dysphagia  Patient Details Name: [name] MRN: [id number] DOB: 1956-01-30 Today's Date: 03/12/2019 Time: 7829-5621 SLP Time Calculation (min) (ACUTE ONLY): 18 min  Assessment / Plan / Recommendation Clinical Impression  Pt has increased oral holding today compared to initial evaluation. Per chart and discussion with RN, she has also had increased RR and decreased mentation as the day goes on. RN reported improved performance during breakfast meal. This afternoon pt did best with thin liquids, taking large, sequential straw sips with seemingly appropriate automaticity. Holding is most prominent with soft solids, with purees slow to transit but with only Min-Mod cues for initiation. Will adjust diet to Dys 1 solids, thin liquids for now pending improvements in mentation and respiratory status.    HPI HPI: Pt is a 63 yo female admitted with AMS after several days of COVID symptoms. Pt had a recent stroke w/u that was negative. MRI brain on 12/21 negative. 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, fibromyalgia, GERD      SLP Plan  Continue with current plan of care       Recommendations  Diet recommendations: Dysphagia 1 (puree);Thin liquid Liquids provided via: Cup;Straw Medication Administration: Crushed with puree Supervision: Full supervision/cueing for compensatory strategies;Staff to assist with self feeding Compensations: Minimize environmental distractions;Slow rate;Small sips/bites Postural Changes and/or Swallow Maneuvers: Seated upright 90 degrees                Oral Care Recommendations: Oral care BID Follow up Recommendations: Skilled Nursing facility SLP Visit Diagnosis: Dysphagia, oral phase (R13.11) Plan: Continue with current plan of care       GO                Virl Axe Aquil Duhe 03/12/2019, 1:39 PM  Mahala Menghini., M.A. CCC-SLP Acute Herbalist  [phone] Office [phone]

## 2019-03-12 NOTE — Progress Notes (Signed)
[place] for Lovenox while Coumadin held for NPO Indication: History of recurrent DVTs and Lupus Anticoagulant  Allergies  Allergen Reactions  . Lisinopril Swelling    THROAT SWELLING Pt states her throat started to "close up" after being on it for "so long."  . Corticosteroids Rash    States she developed a rash on her tongue after a steroid injection in her bac   Patient Measurements: Height: 5\' 6"  (167.6 cm) Weight: 279 lb 1.6 oz (126.6 kg) IBW/kg (Calculated) : 59.3  Vital Signs: Temp: 98.2 F (36.8 C) (12/23 0442) Temp Source: Oral (12/23 0442) BP: 160/89 (12/23 0442) Pulse Rate: 97 (12/23 0000)  Labs: Recent Labs    03/09/19 1901 03/09/19 2011 03/09/19 2342 03/10/19 0242 03/10/19 0618 03/11/19 0522 03/12/19 0203  HGB 19.8*  --   --   --  12.0 12.4 12.0  HCT 60.3*  --   --   --  38.1 39.2 37.8  PLT 227  --   --   --  388 519* 443*  LABPROT  --    < > 21.3*  --  20.4* 23.0* 24.2*  INR  --    < > 1.9*  --  1.8* 2.0* 2.2*  HEPRLOWMOCWT  --   --   --   --   --   --  1.48  CREATININE 1.40*  --   --   --   --  0.94 1.06*  TROPONINIHS  --   --  41* 40*  --   --   --    < > = values in this interval not displayed.    Estimated Creatinine Clearance: 73.9 mL/min (A) (by C-G formula based on SCr of 1.06 mg/dL (H)).    Assessment: 63 y/o F in the ED with altered mental status, on warfarin PTA for hx of DVT, INR is below [place] at 1.8.   Patient was ordered Warfarin 7.5mg  po x1 for yesterday evening but this dose was not given due to patient having shaking episodes and garbled speech. Patient is currently NPO. Stroke work-up was negative.  Per Dr. Louanne Belton, we will change to Lovenox dosing at this time per pharmacy consult while the patient is NPO. SCr is elevated at 1.40 (baseline ~0.7 to 0.9). Normalized CrCl ~46 mL/min. Ok to start Lovenox as INR is < 2.   12/23 INR now up to 2.2. Also Lovenox trough level 1.48 units/ml  (supratherapeutic) on 130mg  subcutaneous q12h.  [place] of Therapy:  INR 2-3 Anti-Xa level 0.6-1 units/ml 4hrs after LMWH dose given Monitor platelets by anticoagulation protocol: Yes   Plan:  Decrease Lovenox to 65 mg subcutaneous q12h Since INR therapeutic and pt cleared for po meds, will d/w rounding MD and see if we can restart coumadin and discontinue Lovenox.  Sherlon Handing, PharmD, BCPS Please see amion for complete clinical pharmacist phone list 03/12/2019, 4:50 AM

## 2019-03-12 NOTE — Progress Notes (Signed)
Notified both daughter and sister on pt care. All questions and concerns addressed.    03/12/19 1130  Family/Significant Other Communication  Family/Significant Other Update Called;Updated

## 2019-03-13 LAB — C-REACTIVE PROTEIN: CRP: 5.7 mg/dL — ABNORMAL HIGH (ref <1.0)

## 2019-03-13 LAB — COMPREHENSIVE METABOLIC PANEL
ALT: 61 U/L — ABNORMAL HIGH (ref 0–44)
AST: 48 U/L — ABNORMAL HIGH (ref 15–41)
Albumin: 3 g/dL — ABNORMAL LOW (ref 3.5–5.0)
Alkaline Phosphatase: 82 U/L (ref 38–126)
Anion gap: 13 (ref 5–15)
BUN: 40 mg/dL — ABNORMAL HIGH (ref 8–23)
CO2: 25 mmol/L (ref 22–32)
Calcium: 7.8 mg/dL — ABNORMAL LOW (ref 8.9–10.3)
Chloride: 114 mmol/L — ABNORMAL HIGH (ref 98–111)
Creatinine, Ser: 1.03 mg/dL — ABNORMAL HIGH (ref 0.44–1.00)
GFR calc Af Amer: >60 mL/min (ref >60)
GFR calc non Af Amer: 58 mL/min — ABNORMAL LOW (ref >60)
Glucose, Bld: 184 mg/dL — ABNORMAL HIGH (ref 70–99)
Potassium: 3 mmol/L — ABNORMAL LOW (ref 3.5–5.1)
Sodium: 152 mmol/L — ABNORMAL HIGH (ref 135–145)
Total Bilirubin: 0.5 mg/dL (ref 0.3–1.2)
Total Protein: 8.2 g/dL — ABNORMAL HIGH (ref 6.5–8.1)

## 2019-03-13 LAB — CBC WITH DIFFERENTIAL/PLATELET
Abs Immature Granulocytes: 0.2 10*3/uL — ABNORMAL HIGH (ref 0.00–0.07)
Basophils Absolute: 0 10*3/uL (ref 0.0–0.1)
Basophils Relative: 0 %
Eosinophils Absolute: 0 10*3/uL (ref 0.0–0.5)
Eosinophils Relative: 0 %
HCT: 38.1 % (ref 36.0–46.0)
Hemoglobin: 11.8 g/dL — ABNORMAL LOW (ref 12.0–15.0)
Immature Granulocytes: 2 %
Lymphocytes Relative: 13 %
Lymphs Abs: 1.3 10*3/uL (ref 0.7–4.0)
MCH: 25.7 pg — ABNORMAL LOW (ref 26.0–34.0)
MCHC: 31 g/dL (ref 30.0–36.0)
MCV: 83 fL (ref 80.0–100.0)
Monocytes Absolute: 0.9 10*3/uL (ref 0.1–1.0)
Monocytes Relative: 9 %
Neutro Abs: 7.7 10*3/uL (ref 1.7–7.7)
Neutrophils Relative %: 76 %
Platelets: 537 10*3/uL — ABNORMAL HIGH (ref 150–400)
RBC: 4.59 MIL/uL (ref 3.87–5.11)
RDW: 18.5 % — ABNORMAL HIGH (ref 11.5–15.5)
WBC: 10.2 10*3/uL (ref 4.0–10.5)
nRBC: 2.1 % — ABNORMAL HIGH (ref 0.0–0.2)

## 2019-03-13 LAB — BASIC METABOLIC PANEL
Anion gap: 12 (ref 5–15)
BUN: 33 mg/dL — ABNORMAL HIGH (ref 8–23)
CO2: 25 mmol/L (ref 22–32)
Calcium: 8 mg/dL — ABNORMAL LOW (ref 8.9–10.3)
Chloride: 115 mmol/L — ABNORMAL HIGH (ref 98–111)
Creatinine, Ser: 0.96 mg/dL (ref 0.44–1.00)
GFR calc Af Amer: >60 mL/min (ref >60)
GFR calc non Af Amer: >60 mL/min (ref >60)
Glucose, Bld: 263 mg/dL — ABNORMAL HIGH (ref 70–99)
Potassium: 3.6 mmol/L (ref 3.5–5.1)
Sodium: 152 mmol/L — ABNORMAL HIGH (ref 135–145)

## 2019-03-13 LAB — PROTIME-INR
INR: 2 — ABNORMAL HIGH (ref 0.8–1.2)
Prothrombin Time: 22.8 seconds — ABNORMAL HIGH (ref 11.4–15.2)

## 2019-03-13 LAB — GLUCOSE, CAPILLARY
Glucose-Capillary: 192 mg/dL — ABNORMAL HIGH (ref 70–99)
Glucose-Capillary: 174 mg/dL — ABNORMAL HIGH (ref 70–99)
Glucose-Capillary: 250 mg/dL — ABNORMAL HIGH (ref 70–99)
Glucose-Capillary: 246 mg/dL — ABNORMAL HIGH (ref 70–99)

## 2019-03-13 LAB — BRAIN NATRIURETIC PEPTIDE: B Natriuretic Peptide: 64.8 pg/mL (ref 0.0–100.0)

## 2019-03-13 LAB — D-DIMER, QUANTITATIVE: D-Dimer, Quant: 0.38 ug/mL-FEU (ref 0.00–0.50)

## 2019-03-13 LAB — MAGNESIUM: Magnesium: 2.1 mg/dL (ref 1.7–2.4)

## 2019-03-13 LAB — FERRITIN: Ferritin: 264 ng/mL (ref 11–307)

## 2019-03-13 MED ORDER — POTASSIUM CHLORIDE CRYS ER 20 MEQ PO TBCR
40.0000 meq | EXTENDED_RELEASE_TABLET | Freq: Two times a day (BID) | ORAL | Status: DC
  Administered 2019-03-13 – 2019-03-16 (×8): 40 meq via ORAL
  Filled 2019-03-13 (×7): qty 2

## 2019-03-13 MED ORDER — DEXTROSE 5 % IV SOLN: INTRAVENOUS | Status: DC

## 2019-03-13 MED ORDER — NITROGLYCERIN 2 % TD OINT
1.0000 [in_us] | TOPICAL_OINTMENT | Freq: Four times a day (QID) | TRANSDERMAL | Status: DC
  Administered 2019-03-13 – 2019-03-16 (×12): 1 [in_us] via TOPICAL
  Filled 2019-03-13 (×2): qty 30

## 2019-03-13 MED ORDER — WARFARIN SODIUM 5 MG PO TABS
5.0000 mg | ORAL_TABLET | Freq: Once | ORAL | Status: AC
  Administered 2019-03-13: 5 mg via ORAL
  Filled 2019-03-13: qty 1

## 2019-03-13 NOTE — Progress Notes (Signed)
[place] for Warfarin Indication: History of recurrent DVTs and Lupus Anticoagulant  Patient Measurements: Height: 5\' 6"  (167.6 cm) Weight: 266 lb 12.1 oz (121 kg) IBW/kg (Calculated) : 59.3  Vital Signs: Temp: 98.8 F (37.1 C) (12/24 0400) Temp Source: Oral (12/24 0400) BP: 157/94 (12/24 0700) Pulse Rate: 87 (12/24 0700)  Labs: Recent Labs    03/11/19 0522 03/12/19 0203 03/12/19 1810 03/13/19 0500  HGB 12.4 12.0  --  11.8*  HCT 39.2 37.8  --  38.1  PLT 519* 443*  --  537*  LABPROT 23.0* 24.2*  --  22.8*  INR 2.0* 2.2*  --  2.0*  HEPRLOWMOCWT  --  1.48  --   --   CREATININE 0.94 1.06* 1.00 1.03*    Estimated Creatinine Clearance: 74.1 mL/min (A) (by C-G formula based on SCr of 1.03 mg/dL (H)).    Assessment: 63 y/o F in the ED with altered mental status, on warfarin PTA for hx of DVT. Admit INR 1.9 and a lovenox bridge was started. The patient's INR has trended up to 2.2 today and pharmacy has been consulted to discontinue lovenox and resume Warfarin.   PTA Warfarin dose 5 mg daily EXCEPT for 10 mg on MWF  INR down to 2 today. Last dose per PTA med list was on 12/19. The patient did receive 1 dose of Flagyl on 12/20 which is known to increase Warfarin sensitivity  [place] of Therapy:  INR 2-3 Monitor platelets by anticoagulation protocol: Yes   Plan:  - Warfarin 5 mg x 1 dose at 1800 today - Daily PT/INR, CBC q72h - Will continue to monitor for any signs/symptoms of bleeding and will follow up with PT/INR in the a.m.    Thank you for allowing pharmacy to be a part of this patient's care.  Ulice Dash, PharmD, BCPS 03/13/2019 10:13 AM

## 2019-03-13 NOTE — Progress Notes (Signed)
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  At that time, she was found to be acutely hypoxic with fever and Covid pneumonia.  Patient started on Remdisivir & Solu-Medrol.  she was transferred on 12/21 over to [facility].  Since admission, she has remained oriented to person only.  She intermittently follows commands and answer some questions appropriately.  Sodium noted since admission to be elevated at 150 as well as persistently elevated high blood pressure.  CT scan of the head repeatedly has been negative.  Today however, patient appears to be more delirious.  Does not really answer my questions.  Sodium briefly improved down to 148 with half-normal saline, back up to 150 today.  She is requiring 2 L of oxygen  Assessment/Plan: Principal Problem:   Acute respiratory failure with hypoxia (HCC) secondary to COVID-19 infection plus COPD with exacerbation: Continue steroids, supplemental oxygen, Remdisivir.  No evidence of bacterial pneumonia, no antibiotics.  Requiring a minimal amount of oxygen Active Problems:   COPD with acute exacerbation (HCC)   Sepsis ([place])   Suspected COVID-19 virus infection   Acute encephalopathy multiple etiologies.  It is possible this could be from mood disorder and have since restarted her Cymbalta and Seroquel.  Also, could be from hypertensive encephalopathy and so have started as needed Lopressor plus her home medications.  Possibly this could be from hypoxia and Covid which we are treating as  well.  We will also consider hypernatremia.  CT scan of the head negative.  Hypernatremia: This could be a contributing factor to her confusion although her hyponatremia is mild at best.  Half-normal saline got her sodium down slightly, but then came back up.  We will try D5W.  Repeat sodium check this afternoon.  Chronic diastolic heart failure: Incidentally noted on echocardiogram earlier this month.  Appears relatively euvolemic with BNP normal on admission.  Now that she is getting IV fluids, follow daily weight.  Recheck BMP this afternoon  Uncontrolled diabetes mellitus type 2 with secondary stage II CKD: Looks to be at baseline.:  On sliding scale, follow CBGs.  A1c at 8.5  Morbid obesity: Patient meets criteria with BMI greater than 40.  Malignant hypertension: Has remained persistently elevated.  This may actually slightly go up with her getting some gentle IV fluids.  Have started as needed Lopressor.  Clarify her home medications.  Recurrent DVT on chronic anticoagulation: On Coumadin, INR therapeutic  Code Status: Full code  Family Communication: Left message for daughter  Disposition Plan: Home once mentation improves  Consultants:  None  Procedures:  None  Antimicrobials:  IV cefepime and vancomycin 12/20-12/22, Flagyl 12/20 x 1 dose  DVT prophylaxis: Coumadin   Objective: Vitals:   03/13/19 0600 03/13/19 0700  BP: (!) 163/98 (!) 157/94  Pulse: 87 87  Resp: (!) 24 (!) 22  Temp:    SpO2: 93% 92%    Intake/Output Summary (Last 24 hours) at 03/13/2019 1117 Last data filed at 03/13/2019 0904 Gross per 24 hour  Intake 2037.24 ml  Output 690 ml  Net 1347.24 ml  Filed Weights   03/09/19 1646 03/13/19 0748  Weight: 126.6 kg 121 kg   Body mass index is 43.06 kg/m.  Exam:   General: Hazy, does not really respond to my questions  HEENT: Normocephalic, atraumatic, mucous membranes slightly dry  Neck: Thick, narrow airway  Cardiovascular: Regular  rate and rhythm, S1-S2  Respiratory: Decreased breath sounds throughout  Abdomen: Soft, obese, nontender, hypoactive bowel sounds  Musculoskeletal: No clubbing or cyanosis, trace pitting edema  Skin: No skin breaks, tears or lesions  Psychiatry: Appears somewhat delirious   Data Reviewed: CBC: Recent Labs  Lab 03/09/19 1901 03/09/19 2011 03/10/19 0618 03/11/19 0522 03/12/19 0203 03/13/19 0500  WBC 5.9  --  11.2* 11.6* 9.0 10.2  NEUTROABS  --   --  10.1* 10.0* 7.2 7.7  HGB 19.8* 12.2 12.0 12.4 12.0 11.8*  HCT 60.3* 36.0 38.1 39.2 37.8 38.1  MCV 80.7  --  83.7 82.9 82.9 83.0  PLT 227  --  388 519* 443* 123456*   Basic Metabolic Panel: Recent Labs  Lab 03/09/19 1901 03/09/19 2011 03/09/19 2342 03/11/19 0522 03/12/19 0203 03/12/19 1810 03/13/19 0500  NA 139 138  --  150* 150* 149* 152*  K 2.7* 2.6*  --  3.6 3.5 3.0* 3.0*  CL 99  --   --  109 112* 114* 114*  CO2 20*  --   --  23 24 22 25   GLUCOSE 164*  --   --  240* 212* 228* 184*  BUN 27*  --   --  26* 32* 36* 40*  CREATININE 1.40*  --   --  0.94 1.06* 1.00 1.03*  CALCIUM 6.9*  --   --  7.7* 7.8* 7.4* 7.8*  MG  --   --  1.4* 2.2 2.3  --  2.1   GFR: Estimated Creatinine Clearance: 74.1 mL/min (A) (by C-G formula based on SCr of 1.03 mg/dL (H)). Liver Function Tests: Recent Labs  Lab 03/09/19 1901 03/11/19 0522 03/12/19 0203 03/13/19 0500  AST 128* 88* 52* 48*  ALT 81* 85* 72* 61*  ALKPHOS 84 98 88 82  BILITOT 1.3* 1.2 0.9 0.5  PROT 8.7* 9.2* 8.8* 8.2*  ALBUMIN 3.0* 3.5 3.1* 3.0*   No results for input(s): LIPASE, AMYLASE in the last 168 hours. No results for input(s): AMMONIA in the last 168 hours. Coagulation Profile: Recent Labs  Lab 03/09/19 2342 03/10/19 0618 03/11/19 0522 03/12/19 0203 03/13/19 0500  INR 1.9* 1.8* 2.0* 2.2* 2.0*   Cardiac Enzymes: No results for input(s): CKTOTAL, CKMB, CKMBINDEX, TROPONINI in the last 168 hours. BNP (last 3 results) No results for input(s): PROBNP in the  last 8760 hours. HbA1C: No results for input(s): HGBA1C in the last 72 hours. CBG: Recent Labs  Lab 03/11/19 2228 03/12/19 0736 03/12/19 1134 03/12/19 1640 03/13/19 0733  GLUCAP 206* 228* 280* 264* 192*   Lipid Profile: No results for input(s): CHOL, HDL, LDLCALC, TRIG, CHOLHDL, LDLDIRECT in the last 72 hours. Thyroid Function Tests: No results for input(s): TSH, T4TOTAL, FREET4, T3FREE, THYROIDAB in the last 72 hours. Anemia Panel: Recent Labs    03/12/19 0203 03/13/19 0500  FERRITIN 337* 264   Urine analysis:    Component Value Date/Time   COLORURINE AMBER (A) 03/09/2019 1918   APPEARANCEUR HAZY (A) 03/09/2019 1918   LABSPEC 1.021 03/09/2019 1918   PHURINE 5.0 03/09/2019 1918   GLUCOSEU NEGATIVE 03/09/2019 1918   HGBUR MODERATE (A) 03/09/2019 1918   BILIRUBINUR NEGATIVE 03/09/2019 1918   KETONESUR 20 (  A) 03/09/2019 1918   PROTEINUR 100 (A) 03/09/2019 1918   UROBILINOGEN 0.2 06/12/2014 2054   NITRITE NEGATIVE 03/09/2019 1918   LEUKOCYTESUR NEGATIVE 03/09/2019 1918   Sepsis Labs: @LABRCNTIP (procalcitonin:4,lacticidven:4)  ) Recent Results (from the past 240 hour(s))  Blood culture (routine x 2)     Status: None (Preliminary result)   Collection Time: 03/09/19  7:01 PM   Specimen: BLOOD LEFT ARM  Result Value Ref Range Status   Specimen Description BLOOD LEFT ARM UPPER  Final   Special Requests   Final    BOTTLES DRAWN AEROBIC AND ANAEROBIC Blood Culture results may not be optimal due to an inadequate volume of blood received in culture bottles   Culture   Final    NO GROWTH 4 DAYS Performed at [facility], [place] [address], [place], [place] [address]    Report Status PENDING  Incomplete  Respiratory Panel by RT PCR (Flu A&B, Covid) - Nasopharyngeal Swab     Status: Abnormal   Collection Time: 03/09/19  7:51 PM   Specimen: Nasopharyngeal Swab  Result Value Ref Range Status   SARS Coronavirus 2 by RT PCR POSITIVE (A) NEGATIVE Final    Comment: RESULT  CALLED TO, READ BACK BY AND VERIFIED WITH: RN T [facility] @0026  03/10/19 BY S GEZAHEGN (NOTE) SARS-CoV-2 target nucleic acids are DETECTED. SARS-CoV-2 RNA is generally detectable in upper respiratory specimens  during the acute phase of infection. Positive results are indicative of the presence of the identified virus, but do not rule out bacterial infection or co-infection with other pathogens not detected by the test. Clinical correlation with patient history and other diagnostic information is necessary to determine patient infection status. The expected result is Negative. Fact Sheet for Patients:  PinkCheek.be Fact Sheet for Healthcare Providers: GravelBags.it This test is not yet approved or cleared by the [place] FDA and  has been authorized for detection and/or diagnosis of SARS-CoV-2 by FDA under an Emergency Use Authorization (EUA).  This EUA will remain in effect (meaning this test can be [place] ed) for the duration of  the COVID-19 declaration under Section 564(b)(1) of the Act, 21 U.S.C. section 360bbb-3(b)(1), unless the authorization is terminated or revoked sooner.    Influenza A by PCR NEGATIVE NEGATIVE Final   Influenza B by PCR NEGATIVE NEGATIVE Final    Comment: (NOTE) The Xpert Xpress SARS-CoV-2/FLU/RSV assay is intended as an aid in  the diagnosis of influenza from Nasopharyngeal swab specimens and  should not be used as a sole basis for treatment. Nasal washings and  aspirates are unacceptable for Xpert Xpress SARS-CoV-2/FLU/RSV  testing. Fact Sheet for Patients: PinkCheek.be Fact Sheet for Healthcare Providers: GravelBags.it This test is not yet approved or cleared by the [place] FDA and  has been authorized for detection and/or diagnosis of SARS-CoV-2 by  FDA under an Emergency Use Authorization (EUA). This EUA will remain  in effect  (meaning this test can be used) for the duration of the  Covid-19 declaration under Section 564(b)(1) of the Act, 21  U.S.C. section 360bbb-3(b)(1), unless the authorization is  terminated or revoked. Performed at [facility], [place] [address], [place], [place] [address]       Studies: No results found.  Scheduled Meds: . amLODipine  5 mg Oral Daily  . Chlorhexidine Gluconate Cloth  6 each Topical Daily  . cloNIDine  0.1 mg Oral BID  . dexamethasone (DECADRON) injection  6 mg Intravenous Q24H  . DULoxetine  20  mg Oral Daily  . DULoxetine  60 mg Oral QHS  . insulin aspart  0-5 Units Subcutaneous QHS  . insulin aspart  0-9 Units Subcutaneous TID WC  . insulin aspart  5 Units Subcutaneous TID WC  . Ipratropium-Albuterol  2 puff Inhalation QID  . magnesium oxide  400 mg Oral BID  . metoprolol tartrate  5 mg Intravenous Q6H  . potassium chloride  40 mEq Oral BID  . pravastatin  40 mg Oral QPM  . warfarin  5 mg Oral ONCE-1800  . Warfarin - Pharmacist Dosing Inpatient   Does not apply q1800    Continuous Infusions: . dextrose    . remdesivir 100 mg in NS 100 mL 100 mg (03/13/19 1038)     LOS: 4 days     Annita Brod, MD [facility]  To reach me or the doctor on call, go to: [email or web address] Password [email or web address]  03/13/2019, 11:17 AM

## 2019-03-14 LAB — BASIC METABOLIC PANEL
Anion gap: 11 (ref 5–15)
BUN: 23 mg/dL (ref 8–23)
CO2: 25 mmol/L (ref 22–32)
Calcium: 7.8 mg/dL — ABNORMAL LOW (ref 8.9–10.3)
Chloride: 108 mmol/L (ref 98–111)
Creatinine, Ser: 0.78 mg/dL (ref 0.44–1.00)
GFR calc Af Amer: >60 mL/min (ref >60)
GFR calc non Af Amer: >60 mL/min (ref >60)
Glucose, Bld: 277 mg/dL — ABNORMAL HIGH (ref 70–99)
Potassium: 3.8 mmol/L (ref 3.5–5.1)
Sodium: 144 mmol/L (ref 135–145)

## 2019-03-14 LAB — CBC WITH DIFFERENTIAL/PLATELET
Abs Immature Granulocytes: 0.18 10*3/uL — ABNORMAL HIGH (ref 0.00–0.07)
Basophils Absolute: 0 10*3/uL (ref 0.0–0.1)
Basophils Relative: 0 %
Eosinophils Absolute: 0 10*3/uL (ref 0.0–0.5)
Eosinophils Relative: 0 %
HCT: 37.6 % (ref 36.0–46.0)
Hemoglobin: 11.6 g/dL — ABNORMAL LOW (ref 12.0–15.0)
Immature Granulocytes: 2 %
Lymphocytes Relative: 14 %
Lymphs Abs: 1.4 10*3/uL (ref 0.7–4.0)
MCH: 26.1 pg (ref 26.0–34.0)
MCHC: 30.9 g/dL (ref 30.0–36.0)
MCV: 84.5 fL (ref 80.0–100.0)
Monocytes Absolute: 0.8 10*3/uL (ref 0.1–1.0)
Monocytes Relative: 9 %
Neutro Abs: 7.4 10*3/uL (ref 1.7–7.7)
Neutrophils Relative %: 75 %
Platelets: 456 10*3/uL — ABNORMAL HIGH (ref 150–400)
RBC: 4.45 MIL/uL (ref 3.87–5.11)
RDW: 18.7 % — ABNORMAL HIGH (ref 11.5–15.5)
WBC: 9.8 10*3/uL (ref 4.0–10.5)
nRBC: 2.7 % — ABNORMAL HIGH (ref 0.0–0.2)

## 2019-03-14 LAB — URINALYSIS, ROUTINE W REFLEX MICROSCOPIC
Bacteria, UA: NONE SEEN
Bilirubin Urine: NEGATIVE
Glucose, UA: 50 mg/dL — AB
Ketones, ur: NEGATIVE mg/dL
Leukocytes,Ua: NEGATIVE
Nitrite: NEGATIVE
Protein, ur: 30 mg/dL — AB
RBC / HPF: >50 RBC/hpf — ABNORMAL HIGH (ref 0–5)
Specific Gravity, Urine: 1.026 (ref 1.005–1.030)
pH: 5 (ref 5.0–8.0)

## 2019-03-14 LAB — COMPREHENSIVE METABOLIC PANEL
ALT: 59 U/L — ABNORMAL HIGH (ref 0–44)
AST: 50 U/L — ABNORMAL HIGH (ref 15–41)
Albumin: 3.1 g/dL — ABNORMAL LOW (ref 3.5–5.0)
Alkaline Phosphatase: 73 U/L (ref 38–126)
Anion gap: 11 (ref 5–15)
BUN: 31 mg/dL — ABNORMAL HIGH (ref 8–23)
CO2: 26 mmol/L (ref 22–32)
Calcium: 8 mg/dL — ABNORMAL LOW (ref 8.9–10.3)
Chloride: 113 mmol/L — ABNORMAL HIGH (ref 98–111)
Creatinine, Ser: 0.89 mg/dL (ref 0.44–1.00)
GFR calc Af Amer: >60 mL/min (ref >60)
GFR calc non Af Amer: >60 mL/min (ref >60)
Glucose, Bld: 176 mg/dL — ABNORMAL HIGH (ref 70–99)
Potassium: 3.4 mmol/L — ABNORMAL LOW (ref 3.5–5.1)
Sodium: 150 mmol/L — ABNORMAL HIGH (ref 135–145)
Total Bilirubin: 0.9 mg/dL (ref 0.3–1.2)
Total Protein: 8.3 g/dL — ABNORMAL HIGH (ref 6.5–8.1)

## 2019-03-14 LAB — POCT I-STAT 7, (LYTES, BLD GAS, ICA,H+H)
Acid-Base Excess: 2 mmol/L (ref 0.0–2.0)
Bicarbonate: 25.7 mmol/L (ref 20.0–28.0)
Calcium, Ion: 1.05 mmol/L — ABNORMAL LOW (ref 1.15–1.40)
HCT: 36 % (ref 36.0–46.0)
Hemoglobin: 12.2 g/dL (ref 12.0–15.0)
O2 Saturation: 97 %
Potassium: 3.6 mmol/L (ref 3.5–5.1)
Sodium: 146 mmol/L — ABNORMAL HIGH (ref 135–145)
TCO2: 27 mmol/L (ref 22–32)
pCO2 arterial: 35.8 mmHg (ref 32.0–48.0)
pH, Arterial: 7.463 — ABNORMAL HIGH (ref 7.350–7.450)
pO2, Arterial: 82 mmHg — ABNORMAL LOW (ref 83.0–108.0)

## 2019-03-14 LAB — PROTIME-INR
INR: 2.1 — ABNORMAL HIGH (ref 0.8–1.2)
Prothrombin Time: 23.5 seconds — ABNORMAL HIGH (ref 11.4–15.2)

## 2019-03-14 LAB — GLUCOSE, CAPILLARY
Glucose-Capillary: 205 mg/dL — ABNORMAL HIGH (ref 70–99)
Glucose-Capillary: 187 mg/dL — ABNORMAL HIGH (ref 70–99)
Glucose-Capillary: 252 mg/dL — ABNORMAL HIGH (ref 70–99)
Glucose-Capillary: 233 mg/dL — ABNORMAL HIGH (ref 70–99)

## 2019-03-14 LAB — CULTURE, BLOOD (ROUTINE X 2): Culture: NO GROWTH

## 2019-03-14 LAB — AMMONIA: Ammonia: 47 umol/L — ABNORMAL HIGH (ref 9–35)

## 2019-03-14 LAB — D-DIMER, QUANTITATIVE: D-Dimer, Quant: 0.68 ug/mL-FEU — ABNORMAL HIGH (ref 0.00–0.50)

## 2019-03-14 LAB — MAGNESIUM: Magnesium: 1.9 mg/dL (ref 1.7–2.4)

## 2019-03-14 LAB — C-REACTIVE PROTEIN: CRP: 4.6 mg/dL — ABNORMAL HIGH (ref <1.0)

## 2019-03-14 LAB — FERRITIN: Ferritin: 228 ng/mL (ref 11–307)

## 2019-03-14 MED ORDER — WARFARIN SODIUM 5 MG PO TABS
5.0000 mg | ORAL_TABLET | Freq: Once | ORAL | Status: AC
  Administered 2019-03-14: 5 mg via ORAL
  Filled 2019-03-14: qty 1

## 2019-03-14 MED ORDER — DEXTROSE 5 % IV SOLN: INTRAVENOUS | Status: DC

## 2019-03-14 MED ORDER — ALTEPLASE 2 MG IJ SOLR
2.0000 mg | Freq: Once | INTRAMUSCULAR | Status: DC
  Filled 2019-03-14: qty 2

## 2019-03-14 NOTE — Progress Notes (Signed)
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  At that time, she was found to be acutely hypoxic with fever and Covid pneumonia.  Patient started on Remdisivir & Solu-Medrol.  she was transferred on 12/21 over to [facility].  Since admission, she has remained oriented to person only.  She intermittently follows commands and answer some questions appropriately.  Sodium noted since admission to be elevated at 150 as well as persistently elevated high blood pressure.  CT scan of the head repeatedly has been negative.  Patient sodium has changed little in the past few days despite half-normal saline.  She still is confused, this morning tearful because she wanted ice.  Assessment/Plan: Principal Problem:   Acute respiratory failure with hypoxia (HCC) secondary to COVID-19 infection plus COPD with exacerbation: Continue steroids, supplemental oxygen, Remdisivir.  No evidence of bacterial pneumonia, no antibiotics.  Requiring a minimal amount of oxygen Active Problems:   COPD with acute exacerbation (HCC)   Sepsis ([place])   Suspected COVID-19 virus infection   Acute encephalopathy multiple etiologies.  Less likely from mood disorder as we restarted her Cymbalta and Seroquel several days ago and no improvement.  Possibly hypertensive encephalopathy, although her blood pressures overall have been not as elevated as earlier.  Her oxygenation is much improved so I do not think that this is from hypoxia.  This could be hyponatremia.  Also  checking ABG, ammonia level and repeat urinalysis.   Hypernatremia: This could be a contributing factor to her confusion although her hyponatremia is mild at best.  Half-normal saline got her sodium down slightly, but then came back up.  We will try D5W.  Repeat sodium check this afternoon.  Chronic diastolic heart failure: Incidentally noted on echocardiogram earlier this month.  Appears relatively euvolemic with BNP normal on admission.  Now that she is getting IV fluids, follow daily weight.  Recheck BMP this afternoon  Uncontrolled diabetes mellitus type 2 with secondary stage II CKD: Looks to be at baseline.:  On sliding scale, follow CBGs.  A1c at 8.5  Morbid obesity: Patient meets criteria with BMI greater than 40.  Malignant hypertension: Has remained somewhat elevated.  This may actually slightly go up with her getting some gentle IV fluids.  Started IV Lopressor several days ago so blood pressure is somewhat better.  Clarify her home medications.  Recurrent DVT on chronic anticoagulation: On Coumadin, INR therapeutic  Code Status: Full code  Family Communication: Updated sister by phone  Disposition Plan: Home once mentation improves  Consultants:  None  Procedures:  None  Antimicrobials:  IV cefepime and vancomycin 12/20-12/22, Flagyl 12/20 x 1 dose  DVT prophylaxis: Coumadin   Objective: Vitals:   03/14/19 0800 03/14/19 1246  BP: (!) 164/93 (!) 154/95  Pulse: 85   Resp: 18   Temp: 97.8 F (36.6 C)   SpO2: 92%     Intake/Output Summary (Last 24 hours) at 03/14/2019 1339 Last data filed at 03/14/2019 0900 Gross per 24 hour  Intake 1468.7 ml  Output 1175 ml  Net 293.7 ml   Filed  Weights   03/09/19 1646 03/13/19 0748 03/14/19 0406  Weight: 126.6 kg 121 kg 121.8 kg   Body mass index is 43.34 kg/m.  Exam:   General: Awake, confused, tearful  HEENT: Normocephalic, atraumatic, mucous membranes dry  Neck: Thick, narrow airway  Cardiovascular:  Regular rate and rhythm, S1-S2  Respiratory: Decreased breath sounds throughout  Abdomen: Soft, obese, nontender, hypoactive bowel sounds  Musculoskeletal: No clubbing or cyanosis, trace pitting edema  Skin: No skin breaks, tears or lesions  Psychiatry: Confused, anxious   Data Reviewed: CBC: Recent Labs  Lab 03/10/19 0618 03/11/19 0522 03/12/19 0203 03/13/19 0500 03/14/19 0320  WBC 11.2* 11.6* 9.0 10.2 9.8  NEUTROABS 10.1* 10.0* 7.2 7.7 7.4  HGB 12.0 12.4 12.0 11.8* 11.6*  HCT 38.1 39.2 37.8 38.1 37.6  MCV 83.7 82.9 82.9 83.0 84.5  PLT 388 519* 443* 537* [id number]*   Basic Metabolic Panel: Recent Labs  Lab 03/09/19 1901 03/09/19 2342 03/11/19 0522 03/12/19 0203 03/12/19 1810 03/13/19 0500 03/13/19 1612 03/14/19 0320  NA   < >  --  150* 150* 149* 152* 152* 150*  K   < >  --  3.6 3.5 3.0* 3.0* 3.6 3.4*  CL  --   --  109 112* 114* 114* 115* 113*  CO2  --   --  23 24 22 25 25 26   GLUCOSE  --   --  240* 212* 228* 184* 263* 176*  BUN  --   --  26* 32* 36* 40* 33* 31*  CREATININE  --   --  0.94 1.06* 1.00 1.03* 0.96 0.89  CALCIUM  --   --  7.7* 7.8* 7.4* 7.8* 8.0* 8.0*  MG  --  1.4* 2.2 2.3  --  2.1  --  1.9   < > = values in this interval not displayed.   GFR: Estimated Creatinine Clearance: 86.1 mL/min (by C-G formula based on SCr of 0.89 mg/dL). Liver Function Tests: Recent Labs  Lab 03/09/19 1901 03/11/19 0522 03/12/19 0203 03/13/19 0500 03/14/19 0320  AST 128* 88* 52* 48* 50*  ALT 81* 85* 72* 61* 59*  ALKPHOS 84 98 88 82 73  BILITOT 1.3* 1.2 0.9 0.5 0.9  PROT 8.7* 9.2* 8.8* 8.2* 8.3*  ALBUMIN 3.0* 3.5 3.1* 3.0* 3.1*   No results for input(s): LIPASE, AMYLASE in the last 168 hours. No results for input(s): AMMONIA in the last 168 hours. Coagulation Profile: Recent Labs  Lab 03/10/19 0618 03/11/19 0522 03/12/19 0203 03/13/19 0500 03/14/19 0320  INR 1.8* 2.0* 2.2* 2.0* 2.1*   Cardiac Enzymes: No results for input(s): CKTOTAL, CKMB, CKMBINDEX,  TROPONINI in the last 168 hours. BNP (last 3 results) No results for input(s): PROBNP in the last 8760 hours. HbA1C: No results for input(s): HGBA1C in the last 72 hours. CBG: Recent Labs  Lab 03/13/19 1153 03/13/19 1612 03/13/19 2024 03/14/19 0757 03/14/19 1156  GLUCAP 174* 250* 246* 205* 187*   Lipid Profile: No results for input(s): CHOL, HDL, LDLCALC, TRIG, CHOLHDL, LDLDIRECT in the last 72 hours. Thyroid Function Tests: No results for input(s): TSH, T4TOTAL, FREET4, T3FREE, THYROIDAB in the last 72 hours. Anemia Panel: Recent Labs    03/13/19 0500 03/14/19 0320  FERRITIN 264 228   Urine analysis:    Component Value Date/Time   COLORURINE AMBER (A) 03/09/2019 1918   APPEARANCEUR HAZY (A) 03/09/2019 1918   LABSPEC 1.021 03/09/2019 1918   PHURINE 5.0 03/09/2019 1918   GLUCOSEU NEGATIVE 03/09/2019 1918   HGBUR MODERATE (A)  03/09/2019 1918   BILIRUBINUR NEGATIVE 03/09/2019 1918   KETONESUR 20 (A) 03/09/2019 1918   PROTEINUR 100 (A) 03/09/2019 1918   UROBILINOGEN 0.2 06/12/2014 2054   NITRITE NEGATIVE 03/09/2019 1918   LEUKOCYTESUR NEGATIVE 03/09/2019 1918   Sepsis Labs: @LABRCNTIP (procalcitonin:4,lacticidven:4)  ) Recent Results (from the past 240 hour(s))  Blood culture (routine x 2)     Status: None   Collection Time: 03/09/19  7:01 PM   Specimen: BLOOD LEFT ARM  Result Value Ref Range Status   Specimen Description BLOOD LEFT ARM UPPER  Final   Special Requests   Final    BOTTLES DRAWN AEROBIC AND ANAEROBIC Blood Culture results may not be optimal due to an inadequate volume of blood received in culture bottles   Culture   Final    NO GROWTH 5 DAYS Performed at [facility], [place] [address], [place], [place] [address]    Report Status 03/14/2019 FINAL  Final  Respiratory Panel by RT PCR (Flu A&B, Covid) - Nasopharyngeal Swab     Status: Abnormal   Collection Time: 03/09/19  7:51 PM   Specimen: Nasopharyngeal Swab  Result Value Ref Range Status    SARS Coronavirus 2 by RT PCR POSITIVE (A) NEGATIVE Final    Comment: RESULT CALLED TO, READ BACK BY AND VERIFIED WITH: RN T [facility] @0026  03/10/19 BY S GEZAHEGN (NOTE) SARS-CoV-2 target nucleic acids are DETECTED. SARS-CoV-2 RNA is generally detectable in upper respiratory specimens  during the acute phase of infection. Positive results are indicative of the presence of the identified virus, but do not rule out bacterial infection or co-infection with other pathogens not detected by the test. Clinical correlation with patient history and other diagnostic information is necessary to determine patient infection status. The expected result is Negative. Fact Sheet for Patients:  PinkCheek.be Fact Sheet for Healthcare Providers: GravelBags.it This test is not yet approved or cleared by the [place] FDA and  has been authorized for detection and/or diagnosis of SARS-CoV-2 by FDA under an Emergency Use Authorization (EUA).  This EUA will remain in effect (meaning this test can be [place] ed) for the duration of  the COVID-19 declaration under Section 564(b)(1) of the Act, 21 U.S.C. section 360bbb-3(b)(1), unless the authorization is terminated or revoked sooner.    Influenza A by PCR NEGATIVE NEGATIVE Final   Influenza B by PCR NEGATIVE NEGATIVE Final    Comment: (NOTE) The Xpert Xpress SARS-CoV-2/FLU/RSV assay is intended as an aid in  the diagnosis of influenza from Nasopharyngeal swab specimens and  should not be used as a sole basis for treatment. Nasal washings and  aspirates are unacceptable for Xpert Xpress SARS-CoV-2/FLU/RSV  testing. Fact Sheet for Patients: PinkCheek.be Fact Sheet for Healthcare Providers: GravelBags.it This test is not yet approved or cleared by the [place] FDA and  has been authorized for detection and/or diagnosis of SARS-CoV-2 by  FDA  under an Emergency Use Authorization (EUA). This EUA will remain  in effect (meaning this test can be used) for the duration of the  Covid-19 declaration under Section 564(b)(1) of the Act, 21  U.S.C. section 360bbb-3(b)(1), unless the authorization is  terminated or revoked. Performed at [facility], [place] [address], [place], [place] [address]       Studies: No results found.  Scheduled Meds: . alteplase  2 mg Intracatheter Once  . amLODipine  5 mg Oral Daily  . Chlorhexidine Gluconate Cloth  6 each Topical Daily  . cloNIDine  0.1 mg Oral BID  . dexamethasone (DECADRON) injection  6 mg Intravenous Q24H  . DULoxetine  20 mg Oral Daily  . DULoxetine  60 mg Oral QHS  . insulin aspart  0-5 Units Subcutaneous QHS  . insulin aspart  0-9 Units Subcutaneous TID WC  . insulin aspart  5 Units Subcutaneous TID WC  . Ipratropium-Albuterol  2 puff Inhalation QID  . magnesium oxide  400 mg Oral BID  . metoprolol tartrate  5 mg Intravenous Q6H  . nitroGLYCERIN  1 inch Topical Q6H  . potassium chloride  40 mEq Oral BID  . pravastatin  40 mg Oral QPM  . Warfarin - Pharmacist Dosing Inpatient   Does not apply q1800    Continuous Infusions: . dextrose 75 mL/hr at 03/14/19 0816     LOS: 5 days     Annita Brod, MD [facility]  To reach me or the doctor on call, go to: [email or web address] Password [email or web address]  03/14/2019, 1:39 PM

## 2019-03-14 NOTE — Plan of Care (Signed)
Pt slept intermittently during the night. No complaints of pain verbalized. Alert and oriented to person and place. Vitals stable on RA, hypertensive but improved from previous reading during the day. Minimal dyspnea on exertion and productive cough noted. Full assist with ADLs. Foley draining concentrated urine, encouraged increased oral fluid intake. Sacral foam intact. PICC line infusing maintenance IV fluids, one lumen occluded - referred to IV team for assessment- they will come this AM.  CHG bath given. Floor mats and bed alarm in place. No other issues, will monitor.   Problem: Education: [place]: Knowledge of risk factors and measures for prevention of condition will improve Outcome: Progressing   Problem: Respiratory: [place]: Will maintain a patent airway Outcome: Progressing [place]: Complications related to the disease process, condition or treatment will be avoided or minimized Outcome: Progressing   Problem: Coping: [place]: Psychosocial and spiritual needs will be supported Outcome: Progressing

## 2019-03-14 NOTE — Progress Notes (Signed)
Ord for Warfarin Indication: History of recurrent DVTs and Lupus Anticoagulant  Patient Measurements: Height: 5\' 6"  (167.6 cm) Weight: 268 lb 8.3 oz (121.8 kg) IBW/kg (Calculated) : 59.3  Vital Signs: Temp: 97.8 F (36.6 C) (12/25 0800) Temp Source: Oral (12/25 0800) BP: 154/95 (12/25 1246) Pulse Rate: 85 (12/25 0800)  Labs: Recent Labs    03/12/19 0203 03/13/19 0500 03/13/19 1612 03/14/19 0320  HGB 12.0 11.8*  --  11.6*  HCT 37.8 38.1  --  37.6  PLT 443* 537*  --  456*  LABPROT 24.2* 22.8*  --  23.5*  INR 2.2* 2.0*  --  2.1*  HEPRLOWMOCWT 1.48  --   --   --   CREATININE 1.06* 1.03* 0.96 0.89    Estimated Creatinine Clearance: 86.1 mL/min (by C-G formula based on SCr of 0.89 mg/dL).    Assessment: 63 y/o F in the ED with altered mental status, on warfarin PTA for hx of DVT. Admit INR 1.9 and a lovenox bridge was started. The patient's INR has trended up to 2.2 today and pharmacy has been consulted to discontinue lovenox and resume Warfarin.   PTA Warfarin dose 5 mg daily EXCEPT for 10 mg on MWF  INR 2.1 today. Last dose per PTA med list was on 12/19. The patient did receive 1 dose of Flagyl on 12/20 which is known to increase Warfarin sensitivity  [place] of Therapy:  INR 2-3 Monitor platelets by anticoagulation protocol: Yes   Plan:  - Warfarin 5 mg x 1 dose at 1800 today - Daily PT/INR, CBC q72h - Will continue to monitor for any signs/symptoms of bleeding  Onnie Boer, PharmD, BCIDP, AAHIVP, CPP Infectious Disease Pharmacist 03/14/2019 3:32 PM

## 2019-03-15 LAB — CBC WITH DIFFERENTIAL/PLATELET
Abs Immature Granulocytes: 0.21 10*3/uL — ABNORMAL HIGH (ref 0.00–0.07)
Basophils Absolute: 0 10*3/uL (ref 0.0–0.1)
Basophils Relative: 0 %
Eosinophils Absolute: 0.1 10*3/uL (ref 0.0–0.5)
Eosinophils Relative: 1 %
HCT: 36 % (ref 36.0–46.0)
Hemoglobin: 11.2 g/dL — ABNORMAL LOW (ref 12.0–15.0)
Immature Granulocytes: 2 %
Lymphocytes Relative: 18 %
Lymphs Abs: 1.8 10*3/uL (ref 0.7–4.0)
MCH: 26 pg (ref 26.0–34.0)
MCHC: 31.1 g/dL (ref 30.0–36.0)
MCV: 83.7 fL (ref 80.0–100.0)
Monocytes Absolute: 0.7 10*3/uL (ref 0.1–1.0)
Monocytes Relative: 7 %
Neutro Abs: 7.1 10*3/uL (ref 1.7–7.7)
Neutrophils Relative %: 72 %
Platelets: 357 10*3/uL (ref 150–400)
RBC: 4.3 MIL/uL (ref 3.87–5.11)
RDW: 18.4 % — ABNORMAL HIGH (ref 11.5–15.5)
WBC: 9.9 10*3/uL (ref 4.0–10.5)
nRBC: 1.1 % — ABNORMAL HIGH (ref 0.0–0.2)

## 2019-03-15 LAB — COMPREHENSIVE METABOLIC PANEL
ALT: 50 U/L — ABNORMAL HIGH (ref 0–44)
AST: 41 U/L (ref 15–41)
Albumin: 2.8 g/dL — ABNORMAL LOW (ref 3.5–5.0)
Alkaline Phosphatase: 66 U/L (ref 38–126)
Anion gap: 12 (ref 5–15)
BUN: 18 mg/dL (ref 8–23)
CO2: 24 mmol/L (ref 22–32)
Calcium: 7.7 mg/dL — ABNORMAL LOW (ref 8.9–10.3)
Chloride: 105 mmol/L (ref 98–111)
Creatinine, Ser: 0.7 mg/dL (ref 0.44–1.00)
GFR calc Af Amer: >60 mL/min (ref >60)
GFR calc non Af Amer: >60 mL/min (ref >60)
Glucose, Bld: 151 mg/dL — ABNORMAL HIGH (ref 70–99)
Potassium: 3.4 mmol/L — ABNORMAL LOW (ref 3.5–5.1)
Sodium: 141 mmol/L (ref 135–145)
Total Bilirubin: 0.5 mg/dL (ref 0.3–1.2)
Total Protein: 7.2 g/dL (ref 6.5–8.1)

## 2019-03-15 LAB — GLUCOSE, CAPILLARY
Glucose-Capillary: 151 mg/dL — ABNORMAL HIGH (ref 70–99)
Glucose-Capillary: 220 mg/dL — ABNORMAL HIGH (ref 70–99)
Glucose-Capillary: 247 mg/dL — ABNORMAL HIGH (ref 70–99)
Glucose-Capillary: 214 mg/dL — ABNORMAL HIGH (ref 70–99)

## 2019-03-15 LAB — PROTIME-INR
INR: 2.5 — ABNORMAL HIGH (ref 0.8–1.2)
Prothrombin Time: 26.9 seconds — ABNORMAL HIGH (ref 11.4–15.2)

## 2019-03-15 LAB — FERRITIN: Ferritin: 208 ng/mL (ref 11–307)

## 2019-03-15 LAB — C-REACTIVE PROTEIN: CRP: 3.6 mg/dL — ABNORMAL HIGH (ref <1.0)

## 2019-03-15 LAB — D-DIMER, QUANTITATIVE: D-Dimer, Quant: 0.91 ug/mL-FEU — ABNORMAL HIGH (ref 0.00–0.50)

## 2019-03-15 LAB — MAGNESIUM: Magnesium: 1.4 mg/dL — ABNORMAL LOW (ref 1.7–2.4)

## 2019-03-15 MED ORDER — LACTULOSE 10 GM/15ML PO SOLN
20.0000 g | Freq: Every day | ORAL | Status: DC
  Administered 2019-03-15 – 2019-03-16 (×2): 20 g via ORAL
  Filled 2019-03-15 (×2): qty 30

## 2019-03-15 MED ORDER — MAGNESIUM SULFATE 2 GM/50ML IV SOLN
2.0000 g | Freq: Once | INTRAVENOUS | Status: AC
  Administered 2019-03-15: 2 g via INTRAVENOUS
  Filled 2019-03-15: qty 50

## 2019-03-15 MED ORDER — WARFARIN SODIUM 5 MG PO TABS
5.0000 mg | ORAL_TABLET | Freq: Once | ORAL | Status: AC
  Administered 2019-03-15: 5 mg via ORAL
  Filled 2019-03-15: qty 1

## 2019-03-15 NOTE — Progress Notes (Signed)
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  At that time, she was found to be acutely hypoxic with fever and Covid pneumonia.  Patient started on Remdisivir & Solu-Medrol.  she was transferred on 12/21 over to [facility].  From a breathing standpoint, she has responded well to medications and has been able to be weaned off of oxygen since 12/24.  Since admission, patient's has remained confused, oriented to person only and sodium has been elevated.  Once we initiated D5W, patient sodium as of 12/26 has finally normalized.  Patient herself is much more alert and oriented today.  She answers questions appropriately and interacts well.    Assessment/Plan: Principal Problem:   Acute respiratory failure with hypoxia (HCC) secondary to COVID-19 infection plus COPD with exacerbation: Continue steroids, supplemental oxygen, Remdisivir.  No evidence of bacterial pneumonia, no antibiotics.  Now weaned off of oxygen.  CRP continues to trend downward.  We will start tapering off steroids once CRP is normalized. Active Problems:    Acute encephalopathy multiple etiologies.  Secondary to hyponatremia.  With correction of her sodium today, patient for the first time is appropriate and alert.  She talked to her sister who agrees that she is now herself.  Note that her ammonia level was minimally elevated at 47 on evening of 12/25 and she did get 1 dose of lactulose.  I do not think that this would account for the marked difference and timing of her  mentation improvement.  Now that she is alert and appropriate, will have speech resee and perhaps can upgrade her diet.  Have also asked physical therapy to see.  Patient normally ambulates with a walker.  Hypernatremia: This could be a contributing factor to her confusion although her hyponatremia is mild at best.  Half-normal saline got her sodium down slightly, but then came back up.  We will try D5W.  Repeat sodium check this afternoon.  Chronic diastolic heart failure: Incidentally noted on echocardiogram earlier this month.  Appears relatively euvolemic with BNP normal on admission.  Her weight has changed little despite IV fluids and her BNP is still within the normal range.  Uncontrolled diabetes mellitus type 2 with secondary stage II CKD: Looks to be at baseline.:  On sliding scale, follow CBGs.  A1c at 8.5.  Morbid obesity: Patient meets criteria with BMI greater than 40.  Malignant hypertension: Has remained somewhat elevated.  This may actually slightly go up with her getting some gentle IV fluids.  Started IV Lopressor several days ago so blood pressure is somewhat better.    Recurrent DVT on chronic anticoagulation: On Coumadin, INR therapeutic  Code Status: Full code  Family Communication: Updated sister by phone  Disposition Plan: Home likely in the next 1 to 2 days once her sodium has been confirmed to stay stable.  Consultants:  None  Procedures:  None  Antimicrobials:  IV cefepime and vancomycin 12/20-12/22, Flagyl 12/20 x 1 dose  DVT prophylaxis: Coumadin   Objective: Vitals:   03/15/19 0804 03/15/19 1249  BP: 138/78 (!) 148/102  Pulse: 83  85  Resp: 18 18  Temp:  98 F (36.7 C)  SpO2: 91% 92%    Intake/Output Summary (Last 24 hours) at 03/15/2019 1255 Last data filed at 03/15/2019 [id number] Gross per 24 hour  Intake 1710 ml  Output 500 ml  Net 1210 ml   Filed Weights   03/09/19 1646 03/13/19 0748 03/14/19 0406  Weight: 126.6 kg 121 kg 121.8 kg    Body mass index is 43.34 kg/m.  Exam:   General: Alert and oriented x3, no acute distress  HEENT: Normocephalic, atraumatic, mucous membranes mildly dry  Neck: Thick, narrow airway  Cardiovascular: Regular rate and rhythm, S1-S2  Respiratory: Decreased breath sounds throughout  Abdomen: Soft, obese, nontender, hypoactive bowel sounds  Musculoskeletal: No clubbing or cyanosis, trace pitting edema  Skin: No skin breaks, tears or lesions  Psychiatry: Appropriate, no evidence of psychoses   Data Reviewed: CBC: Recent Labs  Lab 03/11/19 0522 03/12/19 0203 03/13/19 0500 03/14/19 0320 03/14/19 1603 03/15/19 0600  WBC 11.6* 9.0 10.2 9.8  --  9.9  NEUTROABS 10.0* 7.2 7.7 7.4  --  7.1  HGB 12.4 12.0 11.8* 11.6* 12.2 11.2*  HCT 39.2 37.8 38.1 37.6 36.0 36.0  MCV 82.9 82.9 83.0 84.5  --  83.7  PLT 519* 443* 537* 456*  --  [id number]   Basic Metabolic Panel: Recent Labs  Lab 03/11/19 0522 03/12/19 0203 03/13/19 0500 03/13/19 1612 03/14/19 0320 03/14/19 1603 03/14/19 1720 03/15/19 0600  NA 150* 150* 152* 152* 150* 146* 144 141  K 3.6 3.5 3.0* 3.6 3.4* 3.6 3.8 3.4*  CL 109 112* 114* 115* 113*  --  108 105  CO2 23 24 25 25 26   --  25 24  GLUCOSE 240* 212* 184* 263* 176*  --  277* 151*  BUN 26* 32* 40* 33* 31*  --  23 18  CREATININE 0.94 1.06* 1.03* 0.96 0.89  --  0.78 0.70  CALCIUM 7.7* 7.8* 7.8* 8.0* 8.0*  --  7.8* 7.7*  MG 2.2 2.3 2.1  --  1.9  --   --  1.4*   GFR: Estimated Creatinine Clearance: 95.8 mL/min (by C-G formula based on SCr of 0.7 mg/dL). Liver Function Tests: Recent Labs  Lab 03/11/19 0522 03/12/19 0203 03/13/19 0500 03/14/19 0320 03/15/19 0600  AST 88* 52* 48* 50* 41  ALT 85* 72* 61* 59* 50*  ALKPHOS 98 88 82 73 66  BILITOT 1.2 0.9 0.5 0.9 0.5  PROT 9.2* 8.8* 8.2* 8.3* 7.2  ALBUMIN 3.5 3.1* 3.0* 3.1* 2.8*   No results for input(s): LIPASE, AMYLASE in the last 168 hours. Recent Labs  Lab 03/14/19 1720  AMMONIA 47*   Coagulation  Profile: Recent Labs  Lab 03/11/19 0522 03/12/19 0203 03/13/19 0500 03/14/19 0320 03/15/19 0600  INR 2.0* 2.2* 2.0* 2.1* 2.5*   Cardiac Enzymes: No results for input(s): CKTOTAL, CKMB, CKMBINDEX, TROPONINI in the last 168 hours. BNP (last 3 results) No results for input(s): PROBNP in the last 8760 hours. HbA1C: No results for input(s): HGBA1C in the last 72 hours. CBG: Recent Labs  Lab 03/14/19 1156 03/14/19 1737 03/14/19 2007 03/15/19 0827 03/15/19 1202  GLUCAP 187* 252* 233* 151* 220*   Lipid Profile: No results for input(s): CHOL, HDL, LDLCALC, TRIG, CHOLHDL, LDLDIRECT in the last 72 hours. Thyroid Function Tests: No results for input(s): TSH, T4TOTAL, FREET4, T3FREE, THYROIDAB in the last 72 hours. Anemia Panel: Recent Labs    03/14/19 0320 03/15/19 0600  FERRITIN 228 208   Urine  analysis:    Component Value Date/Time   COLORURINE YELLOW 03/14/2019 1415   APPEARANCEUR TURBID (A) 03/14/2019 1415   LABSPEC 1.026 03/14/2019 1415   PHURINE 5.0 03/14/2019 1415   GLUCOSEU 50 (A) 03/14/2019 1415   HGBUR MODERATE (A) 03/14/2019 1415   BILIRUBINUR NEGATIVE 03/14/2019 1415   KETONESUR NEGATIVE 03/14/2019 1415   PROTEINUR 30 (A) 03/14/2019 1415   UROBILINOGEN 0.2 06/12/2014 2054   NITRITE NEGATIVE 03/14/2019 1415   LEUKOCYTESUR NEGATIVE 03/14/2019 1415   Sepsis Labs: @LABRCNTIP (procalcitonin:4,lacticidven:4)  ) Recent Results (from the past 240 hour(s))  Blood culture (routine x 2)     Status: None   Collection Time: 03/09/19  7:01 PM   Specimen: BLOOD LEFT ARM  Result Value Ref Range Status   Specimen Description BLOOD LEFT ARM UPPER  Final   Special Requests   Final    BOTTLES DRAWN AEROBIC AND ANAEROBIC Blood Culture results may not be optimal due to an inadequate volume of blood received in culture bottles   Culture   Final    NO GROWTH 5 DAYS Performed at [facility], [place] [address], [place], [place] [address]    Report Status 03/14/2019 FINAL   Final  Respiratory Panel by RT PCR (Flu A&B, Covid) - Nasopharyngeal Swab     Status: Abnormal   Collection Time: 03/09/19  7:51 PM   Specimen: Nasopharyngeal Swab  Result Value Ref Range Status   SARS Coronavirus 2 by RT PCR POSITIVE (A) NEGATIVE Final    Comment: RESULT CALLED TO, READ BACK BY AND VERIFIED WITH: RN T [facility] @0026  03/10/19 BY S GEZAHEGN (NOTE) SARS-CoV-2 target nucleic acids are DETECTED. SARS-CoV-2 RNA is generally detectable in upper respiratory specimens  during the acute phase of infection. Positive results are indicative of the presence of the identified virus, but do not rule out bacterial infection or co-infection with other pathogens not detected by the test. Clinical correlation with patient history and other diagnostic information is necessary to determine patient infection status. The expected result is Negative. Fact Sheet for Patients:  PinkCheek.be Fact Sheet for Healthcare Providers: GravelBags.it This test is not yet approved or cleared by the [place] FDA and  has been authorized for detection and/or diagnosis of SARS-CoV-2 by FDA under an Emergency Use Authorization (EUA).  This EUA will remain in effect (meaning this test can be [place] ed) for the duration of  the COVID-19 declaration under Section 564(b)(1) of the Act, 21 U.S.C. section 360bbb-3(b)(1), unless the authorization is terminated or revoked sooner.    Influenza A by PCR NEGATIVE NEGATIVE Final   Influenza B by PCR NEGATIVE NEGATIVE Final    Comment: (NOTE) The Xpert Xpress SARS-CoV-2/FLU/RSV assay is intended as an aid in  the diagnosis of influenza from Nasopharyngeal swab specimens and  should not be used as a sole basis for treatment. Nasal washings and  aspirates are unacceptable for Xpert Xpress SARS-CoV-2/FLU/RSV  testing. Fact Sheet for Patients: PinkCheek.be Fact Sheet for Healthcare  Providers: GravelBags.it This test is not yet approved or cleared by the [place] FDA and  has been authorized for detection and/or diagnosis of SARS-CoV-2 by  FDA under an Emergency Use Authorization (EUA). This EUA will remain  in effect (meaning this test can be used) for the duration of the  Covid-19 declaration under Section 564(b)(1) of the Act, 21  U.S.C. section 360bbb-3(b)(1), unless the authorization is  terminated or revoked. Performed at [facility], [place] [address], [place], [place]  [address]       Studies: No results found.  Scheduled Meds: . alteplase  2 mg Intracatheter Once  . amLODipine  5 mg Oral Daily  . Chlorhexidine Gluconate Cloth  6 each Topical Daily  . cloNIDine  0.1 mg Oral BID  . dexamethasone (DECADRON) injection  6 mg Intravenous Q24H  . DULoxetine  20 mg Oral Daily  . DULoxetine  60 mg Oral QHS  . insulin aspart  0-5 Units Subcutaneous QHS  . insulin aspart  0-9 Units Subcutaneous TID WC  . insulin aspart  5 Units Subcutaneous TID WC  . Ipratropium-Albuterol  2 puff Inhalation QID  . lactulose  20 g Oral Daily  . magnesium oxide  400 mg Oral BID  . metoprolol tartrate  5 mg Intravenous Q6H  . nitroGLYCERIN  1 inch Topical Q6H  . potassium chloride  40 mEq Oral BID  . pravastatin  40 mg Oral QPM  . Warfarin - Pharmacist Dosing Inpatient   Does not apply q1800    Continuous Infusions: . dextrose 75 mL/hr at 03/14/19 0816     LOS: 6 days     Annita Brod, MD [facility]  To reach me or the doctor on call, go to: [email or web address] Password [facility]  03/15/2019, 12:55 PM

## 2019-03-15 NOTE — Progress Notes (Signed)
ANTICOAGULATION CONSULT NOTE   Pharmacy Consult for Warfarin Indication: History of recurrent DVTs and Lupus Anticoagulant  Patient Measurements: Height: 5\' 6"  (167.6 cm) Weight: 268 lb 8.3 oz (121.8 kg) IBW/kg (Calculated) : 59.3  Vital Signs: Temp: 98 F (36.7 C) (12/26 1249) Temp Source: Oral (12/26 1249) BP: 148/102 (12/26 1249) Pulse Rate: 85 (12/26 1249)  Labs: Recent Labs    03/13/19 0500 03/14/19 0320 03/14/19 1603 03/14/19 1720 03/15/19 0600  HGB 11.8* 11.6* 12.2  --  11.2*  HCT 38.1 37.6 36.0  --  36.0  PLT 537* 456*  --   --  357  LABPROT 22.8* 23.5*  --   --  26.9*  INR 2.0* 2.1*  --   --  2.5*  CREATININE 1.03* 0.89  --  0.78 0.70    Estimated Creatinine Clearance: 95.8 mL/min (by C-G formula based on SCr of 0.7 mg/dL).    Assessment: 63 y/o F in the ED with altered mental status, on warfarin PTA for hx of DVT. Admit INR 1.9 and a lovenox bridge was started. The patient's INR has trended up to 2.2 today and pharmacy has been consulted to discontinue lovenox and resume Warfarin.   PTA Warfarin dose 5 mg daily EXCEPT for 10 mg on MWF  INR 2.1 today. Last dose per PTA med list was on 12/19. The patient did receive 1 dose of Flagyl on 12/20 which is known to increase Warfarin sensitivity  03/15/2019 INR 2.5, therapeutic H/H WNL, Plts ok  [place] of Therapy:  INR 2-3 Monitor platelets by anticoagulation protocol: Yes   Plan:  - Warfarin 5 mg x 1 dose at 1800 today - Daily PT/INR, CBC q72h - Will continue to monitor for any signs/symptoms of bleeding  Dolly Rias RPh 03/15/2019, 1:42 PM

## 2019-03-15 NOTE — Progress Notes (Signed)
  Speech Language Pathology Treatment: Dysphagia  Patient Details Name: [name] MRN: [id number] DOB: 29-Dec-1955 Today's Date: 03/15/2019 Time: 3016-0109 SLP Time Calculation (min) (ACUTE ONLY): 16 min  Assessment / Plan / Recommendation Clinical Impression  Pt appears recovered. Able to self feed regular solids and thin liquids without oral holding or any difficulty noted. All she wants is bologna and jello. I brought her some jello and she lit up with joy. Will upgrade diet to regular thin and sign off.    HPI HPI: Pt is a 63 yo female admitted with AMS after several days of COVID symptoms. Pt had a recent stroke w/u that was negative. MRI brain on 12/21 negative. 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

## 2019-03-16 LAB — CBC
HCT: 34.7 % — ABNORMAL LOW (ref 36.0–46.0)
Hemoglobin: 10.9 g/dL — ABNORMAL LOW (ref 12.0–15.0)
MCH: 26.1 pg (ref 26.0–34.0)
MCHC: 31.4 g/dL (ref 30.0–36.0)
MCV: 83 fL (ref 80.0–100.0)
Platelets: 308 10*3/uL (ref 150–400)
RBC: 4.18 MIL/uL (ref 3.87–5.11)
RDW: 18.2 % — ABNORMAL HIGH (ref 11.5–15.5)
WBC: 9.9 10*3/uL (ref 4.0–10.5)
nRBC: 0.6 % — ABNORMAL HIGH (ref 0.0–0.2)

## 2019-03-16 LAB — BASIC METABOLIC PANEL
Anion gap: 16 — ABNORMAL HIGH (ref 5–15)
BUN: 17 mg/dL (ref 8–23)
CO2: 24 mmol/L (ref 22–32)
Calcium: 7.6 mg/dL — ABNORMAL LOW (ref 8.9–10.3)
Chloride: 96 mmol/L — ABNORMAL LOW (ref 98–111)
Creatinine, Ser: 0.67 mg/dL (ref 0.44–1.00)
GFR calc Af Amer: >60 mL/min (ref >60)
GFR calc non Af Amer: >60 mL/min (ref >60)
Glucose, Bld: 167 mg/dL — ABNORMAL HIGH (ref 70–99)
Potassium: 3.5 mmol/L (ref 3.5–5.1)
Sodium: 136 mmol/L (ref 135–145)

## 2019-03-16 LAB — GLUCOSE, CAPILLARY: Glucose-Capillary: 169 mg/dL — ABNORMAL HIGH (ref 70–99)

## 2019-03-16 LAB — D-DIMER, QUANTITATIVE: D-Dimer, Quant: 1.06 ug/mL-FEU — ABNORMAL HIGH (ref 0.00–0.50)

## 2019-03-16 LAB — C-REACTIVE PROTEIN: CRP: 2.9 mg/dL — ABNORMAL HIGH (ref <1.0)

## 2019-03-16 LAB — PROTIME-INR
INR: 2.5 — ABNORMAL HIGH (ref 0.8–1.2)
Prothrombin Time: 26.8 seconds — ABNORMAL HIGH (ref 11.4–15.2)

## 2019-03-16 LAB — AMMONIA: Ammonia: 53 umol/L — ABNORMAL HIGH (ref 9–35)

## 2019-03-16 LAB — FERRITIN: Ferritin: 200 ng/mL (ref 11–307)

## 2019-03-16 MED ORDER — WARFARIN SODIUM 5 MG PO TABS
5.0000 mg | ORAL_TABLET | Freq: Once | ORAL | Status: DC
  Filled 2019-03-16: qty 1

## 2019-03-16 MED ORDER — PREDNISONE 10 MG PO TABS: ORAL_TABLET | ORAL | 0 refills | Status: AC

## 2019-03-16 NOTE — Progress Notes (Signed)
CSW attempted to reach patient to discuss home health, but error message received when calling the room phone so a voicemail was left on patient's cell phone for her to call back.   Laveda Abbe, [place] Clinical Social Worker [phone]

## 2019-03-16 NOTE — Progress Notes (Addendum)
Patient discharged to home via wheelchair, accompanied by family. Discharge teaching completed and included: new medication regimen, where to pick up new script, follow-up appointment information, covid isolation information including how long to isolate at home after discharge, and signs and symptoms to return with/report immediately. Patient verbalizes understanding and asks appropriate questions.

## 2019-03-16 NOTE — Discharge Summary (Addendum)
Discharge Summary  [name] [id number] DOB: [birth year]  PCP: Loretha Brasil, FNP  Admit date: 03/09/2019 Discharge date: 03/16/2019  Time spent: 25 minutes  Recommendations for Outpatient Follow-up:  1. New medication: Steroid taper over next 6 days 2. Patient will be going home with home health physical therapy 3. Patient be discharged in the care of her daughter who will stay with her until she is more physically independent 4. Patient will need to isolate until January 10 (3 weeks from date of positive Covid test on December 20th)  Discharge Diagnoses:  Active Hospital Problems   Diagnosis Date Noted  . Acute respiratory failure with hypoxia ([place]) 03/09/2019  . Chronic diastolic CHF (congestive heart failure) ([place]) 03/12/2019  . Sepsis ([place]) 03/09/2019  . Suspected COVID-19 virus infection 03/09/2019  . Acute encephalopathy 03/09/2019  . Obstructive sleep apnea on CPAP 04/24/2014  . Morbid obesity ([place]) 08/21/2013  . COPD with acute exacerbation ([place]) 08/17/2013  . Diabetes mellitus type 2 in obese ([place]) 11/05/2011  . HTN (hypertension) 11/05/2011    Resolved Hospital Problems  No resolved problems to display.    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  At that time, she was found to be acutely hypoxic with fever and Covid pneumonia.  Patient started on  Remdisivir & Solu-Medrol.  she was transferred on 12/21 over to [facility].  Hospital Course:  Principal Problem:   Acute respiratory failure with hypoxia (HCC) secondary to COVID-19 infection plus COPD with exacerbation: Finished full course of remdesivir.  Able to be weaned off of oxygen by 12/25.  No evidence of bacterial pneumonia, normal procalcitonin, therefore no antibiotics.  CRP continues to trend downward.  Still somewhat elevated at 2.9 on discharge so we will plan to discharge on a quick prednisone taper.  We recommend 21-day isolation following positive Covid test.  Patient's test was on admission 12/20, therefore she will need to isolate until 1/10.  Note that this was thought to be sepsis on admission initially, however sepsis was ruled out. Active Problems:    Acute encephalopathy secondary to hypernatremia: Patient presented with elevated sodium level of 150.  Initially she was dehydrated and she was treated with half-normal saline however this did not change her sodium very much.  This was changed over to D5W and with that, patient's sodium corrected to within normal range on 12/26.  At that point, patient's confusion completely resolved.  She is fully appropriate and alert and mentation is confirmed by her sister.     Note that her ammonia level was minimally elevated at 47 on evening of 12/25 and she did get 1 dose of lactulose.  She continued to get some lactulose and ammonia level actually slightly rose to 53 on 12/27 other mentation still has been completely normal.  Seen by speech therapy who felt that she was fully alert and they normalized her  dietary restrictions.  Chronic diastolic heart failure: Incidentally noted on echocardiogram earlier this month.  Appears relatively euvolemic with BNP normal on admission.  Her weight has changed little despite IV fluids and her BNP is still within the normal range.  Uncontrolled diabetes mellitus type 2 with secondary stage  II CKD: Looks to be at baseline.:  On sliding scale, follow CBGs.  A1c at 8.5.  Morbid obesity: Patient meets criteria with BMI greater than 40.  Malignant hypertension: Has remained somewhat elevated.    Added IV Lopressor now that she is able to take p.o., resumed her normal home medications.  Recurrent DVT on chronic anticoagulation: On Coumadin, INR therapeutic.  Was 2.5 on day of discharge  Procedures:  None  Consultations:  None  Discharge Exam: BP (!) 140/100 (BP Location: Left Arm)   Pulse 81   Temp 98.9 F (37.2 C) (Axillary)   Resp 18   Ht 5\' 6"  (1.676 m)   Wt 123.6 kg   SpO2 93%   BMI 43.98 kg/m   General: Alert and oriented x3, no acute distress Cardiovascular: Regular rate and rhythm, S1-S2 Respiratory: Clear to auscultation bilaterally  Discharge Instructions You were cared for by a hospitalist during your hospital stay. If you have any questions about your discharge medications or the care you received while you were in the hospital after you are discharged, you can call the unit and asked to speak with the hospitalist on call if the hospitalist that took care of you is not available. Once you are discharged, your primary care physician will handle any further medical issues. Please note that NO REFILLS for any discharge medications will be authorized once you are discharged, as it is imperative that you return to your primary care physician (or establish a relationship with a primary care physician if you do not have one) for your aftercare needs so that they can reassess your need for medications and monitor your lab values.  Discharge Instructions    Diet - low sodium heart healthy   Complete by: As directed    Increase activity slowly   Complete by: As directed      Allergies as of 03/16/2019      Reactions   Lisinopril Swelling   THROAT SWELLING Pt states her throat started to "close up" after being on it for "so long."   Corticosteroids Rash    States she developed a rash on her tongue after a steroid injection in her bac      Medication List    TAKE these medications   albuterol 108 (90 Base) MCG/ACT inhaler Commonly known as: VENTOLIN HFA Inhale 2 puffs into the lungs every 4 (four) hours as needed for shortness of breath.   albuterol (2.5 MG/3ML) 0.083% nebulizer solution Commonly known as: PROVENTIL Take 2.5 mg by nebulization every 6 (six) hours as needed for wheezing or shortness of breath.   amLODipine 5 MG tablet Commonly known as: NORVASC Take 5 mg by mouth daily.   Anoro Ellipta 62.5-25 MCG/INH Aepb Generic drug: umeclidinium-vilanterol Inhale 1 puff into the lungs daily.   aspirin 81 MG EC tablet Take 1 tablet (81 mg total) by mouth daily.   Cartia XT 240 MG 24 hr capsule Generic drug: diltiazem Take 240 mg by mouth daily.   cloNIDine 0.1 MG tablet Commonly known as: CATAPRES Take 0.1 mg by mouth 2 (two) times daily.   diclofenac sodium 1 % Gel Commonly known as: VOLTAREN Apply 2 g topically daily as  needed (fibromyalgia pain).   DULoxetine 60 MG capsule Commonly known as: CYMBALTA Take 60 mg by mouth at bedtime.   DULoxetine 20 MG capsule Commonly known as: CYMBALTA Take 20 mg by mouth daily.   fluticasone 50 MCG/ACT nasal spray Commonly known as: FLONASE Place 1 spray into both nostrils daily.   gabapentin 300 MG capsule Commonly known as: NEURONTIN Take 300 mg by mouth at bedtime.   hydrochlorothiazide 12.5 MG tablet Commonly known as: HYDRODIURIL Take 12.5 mg by mouth daily.   Invokana 100 MG Tabs tablet Generic drug: canagliflozin Take 100 mg by mouth daily.   levocetirizine 5 MG tablet Commonly known as: XYZAL Take 5 mg by mouth at bedtime.   Linzess 145 MCG Caps capsule Generic drug: linaclotide Take 145 mcg by mouth daily before breakfast.   meclizine 25 MG tablet Commonly known as: ANTIVERT Take 1 tablet (25 mg total) by mouth 3 (three) times daily as needed for  dizziness.   metFORMIN 500 MG tablet Commonly known as: GLUCOPHAGE Take 500 mg by mouth at bedtime.   metoCLOPramide 10 MG tablet Commonly known as: REGLAN Take 1 tablet (10 mg total) by mouth every 6 (six) hours as needed for nausea (or headache).   omeprazole 20 MG capsule Commonly known as: PRILOSEC Take 20 mg by mouth 2 (two) times daily.   pravastatin 40 MG tablet Commonly known as: PRAVACHOL Take 40 mg by mouth every evening.   predniSONE 10 MG tablet Commonly known as: DELTASONE Take 3 tablets (30 mg total) by mouth daily for 2 days, THEN 2 tablets (20 mg total) daily for 2 days, THEN 1 tablet (10 mg total) daily for 2 days. Start taking on: March 16, 2019   QUEtiapine 50 MG tablet Commonly known as: SEROQUEL Take 50 mg by mouth at bedtime.   Rexulti 2 MG Tabs tablet Generic drug: brexpiprazole Take 2 mg by mouth daily.   tiZANidine 4 MG tablet Commonly known as: ZANAFLEX Take 4 mg by mouth 2 (two) times daily as needed for muscle spasms.   warfarin 5 MG tablet Commonly known as: COUMADIN Take as directed. If you are unsure how to take this medication, talk to your nurse or doctor. Original instructions: TAKE 1 TABLET BY MOUTH DAILY(EXCEPT ON Monday, Wednesday, Friday) OR AS DIRECTED   zonisamide 50 MG capsule Commonly known as: ZONEGRAN Take 50 mg by mouth 2 (two) times a day.      Allergies  Allergen Reactions  . Lisinopril Swelling    THROAT SWELLING Pt states her throat started to "close up" after being on it for "so long."  . Corticosteroids Rash    States she developed a rash on her tongue after a steroid injection in her bac   Follow-up [place], Deneda T, FNP Follow up in 1 month(s).   Specialty: Family Medicine Contact information: [facility] [place] [address] [phone]            The results of significant diagnostics from this hospitalization (including imaging, microbiology, ancillary  and laboratory) are listed below for reference.    Significant Diagnostic Studies: EEG  Result Date: 03/10/2019 Lora Havens, MD     03/10/2019  1:36 PM Patient Name: [name] MRN: [id number] Epilepsy Attending: Lora Havens Referring Physician/Provider: Dr. Wonda Amis Date: 03/10/2019 Duration: 25.59 minutes Patient history: 63 year old female who presented with intermittent right-sided weakness and shaking.  EEG to evaluate for seizures. Level of alertness: Awake  AEDs during EEG study: None Technical aspects: This EEG study was done with scalp electrodes positioned according to the 10-20 International system of electrode placement. Electrical activity was acquired at a sampling rate of 500Hz  and reviewed with a high frequency filter of 70Hz  and a low frequency filter of 1Hz . EEG data were recorded continuously and digitally stored. Description: During awake state, no clear posterior dominant was seen.  EEG showed continuous generalized 3 to 5 Hz theta-delta slowing.  Hyperventilation photic summation were not performed. Multiple episodes of right and left arm tremor-like movements were captured.  One episode during which patient was moving her head from side to side and had tremor-like movements of mouth was also recorded. Concomitant EEG before during and after the episodes did not show any EEG changes or seizures. Abnormality - Continuous slow, generalized IMPRESSION: This study is suggestive of mild to moderate diffuse encephalopathy, nonspecific etiology.  Multiple episodes of left and right arm tremor-like movements as well as mouth movements were captured as described above without EEG change.  These episodes were nonepileptic. No seizures or epileptiform discharges were seen throughout the recording. Priyanka Barbra Sarks   CT ANGIO HEAD W OR WO CONTRAST  Result Date: 02/20/2019 CLINICAL DATA:  Focal neuro deficit, less than 6 hours, stroke suspected. Additional history provided:  Right-sided weakness. EXAM: CT ANGIOGRAPHY HEAD AND NECK TECHNIQUE: Multidetector CT imaging of the head and neck was performed using the standard protocol during bolus administration of intravenous contrast. Multiplanar CT image reconstructions and MIPs were obtained to evaluate the vascular anatomy. Carotid stenosis measurements (when applicable) are obtained utilizing NASCET criteria, using the distal internal carotid diameter as the denominator. CONTRAST:  166mL OMNIPAQUE IOHEXOL 350 MG/ML SOLN COMPARISON:  Brain MRI 04/21/2018, head CT 03/04/2018 FINDINGS: CT HEAD FINDINGS Brain: No evidence of acute intracranial hemorrhage. No demarcated cortical infarction. No evidence of intracranial mass. No midline shift or extra-axial fluid collection. Mild generalized parenchymal atrophy and chronic small vessel ischemic disease. Vascular: Reported separately Skull: Normal. Negative for fracture or focal lesion. Sinuses: No significant paranasal sinus disease or mastoid effusion. Orbits: Visualized orbits demonstrate no acute abnormality. Review of the MIP images confirms the above findings CTA NECK FINDINGS The examination is mildly motion degraded. Aortic arch: Standard aortic branching. Right carotid system: CCA and ICA patent within the neck without stenosis. Mild calcified plaque at the carotid bifurcation. Partially retropharyngeal course of the ICA. Left carotid system: CCA and ICA patent within the neck without stenosis. No significant atherosclerotic disease. Medial course of the left common carotid artery. Vertebral arteries: Right vertebral artery slightly dominant. The vertebral arteries are patent within the neck bilaterally without significant stenosis. Skeleton: No acute bony abnormality. Reversal of the expected cervical lordosis. Cervical spondylosis without high-grade bony spinal canal narrowing. Redemonstrated sequela of prior C6-C7 ACDF. Other neck: No neck mass or cervical adenopathy. The right  thyroid lobe is surgically absent. Upper chest: No consolidation within the imaged lung apices. Review of the MIP images confirms the above findings CTA HEAD FINDINGS Anterior circulation: The intracranial internal carotid arteries are patent bilaterally without significant stenosis. The right middle and anterior cerebral arteries are patent without significant proximal stenosis. The left middle cerebral artery is patent without significant proximal stenosis. The left anterior cerebral artery is patent. Mild focal stenosis within the left A2 segment (series 11, image 208). No intracranial aneurysm is identified. Posterior circulation: The intracranial vertebral arteries are patent without significant stenosis, as is the basilar artery. The bilateral posterior cerebral  arteries are patent without significant proximal stenosis. Posterior communicating arteries are poorly delineated and may be hypoplastic or absent bilaterally. Venous sinuses: Within limitations of contrast timing, no convincing thrombus. Anatomic variants: As described Review of the MIP images confirms the above findings IMPRESSION: CT head: 1. No evidence of acute intracranial abnormality. 2. Mild generalized parenchymal atrophy and chronic small vessel ischemic disease. CTA neck: The common carotid, internal carotid and vertebral arteries are patent within the neck without significant stenosis. Mild atherosclerotic disease at the right carotid bifurcation. CTA head: 1. No intracranial large vessel occlusion or proximal high-grade arterial stenosis. 2. Mild focal stenosis within the A2 left anterior cerebral artery. Electronically Signed   By: Kellie Simmering DO   On: 02/20/2019 10:23   CT Head Wo Contrast  Result Date: 03/09/2019 CLINICAL DATA:  Ams EXAM: CT HEAD WITHOUT CONTRAST TECHNIQUE: Contiguous axial images were obtained from the base of the skull through the vertex without intravenous contrast. COMPARISON:  Feb 20, 2019 FINDINGS: Brain: No  evidence of acute territorial infarction, hemorrhage, hydrocephalus,extra-axial collection or mass lesion/mass effect. There is mild dilatation the ventricles and sulci consistent with age-related atrophy. Low-attenuation changes in the deep white matter consistent with small vessel ischemia. Vascular: No hyperdense vessel or unexpected calcification. Skull: The skull is intact. No fracture or focal lesion identified. Sinuses/Orbits: The visualized paranasal sinuses and mastoid air cells are clear. The orbits and globes intact. Other: None IMPRESSION: 1. No acute intracranial abnormality. 2. Findings consistent with mild age related atrophy and chronic small vessel ischemia Electronically Signed   By: Prudencio Pair M.D.   On: 03/09/2019 17:44   CT ANGIO NECK W OR WO CONTRAST  Result Date: 02/20/2019 CLINICAL DATA:  Focal neuro deficit, less than 6 hours, stroke suspected. Additional history provided: Right-sided weakness. EXAM: CT ANGIOGRAPHY HEAD AND NECK TECHNIQUE: Multidetector CT imaging of the head and neck was performed using the standard protocol during bolus administration of intravenous contrast. Multiplanar CT image reconstructions and MIPs were obtained to evaluate the vascular anatomy. Carotid stenosis measurements (when applicable) are obtained utilizing NASCET criteria, using the distal internal carotid diameter as the denominator. CONTRAST:  173mL OMNIPAQUE IOHEXOL 350 MG/ML SOLN COMPARISON:  Brain MRI 04/21/2018, head CT 03/04/2018 FINDINGS: CT HEAD FINDINGS Brain: No evidence of acute intracranial hemorrhage. No demarcated cortical infarction. No evidence of intracranial mass. No midline shift or extra-axial fluid collection. Mild generalized parenchymal atrophy and chronic small vessel ischemic disease. Vascular: Reported separately Skull: Normal. Negative for fracture or focal lesion. Sinuses: No significant paranasal sinus disease or mastoid effusion. Orbits: Visualized orbits demonstrate no  acute abnormality. Review of the MIP images confirms the above findings CTA NECK FINDINGS The examination is mildly motion degraded. Aortic arch: Standard aortic branching. Right carotid system: CCA and ICA patent within the neck without stenosis. Mild calcified plaque at the carotid bifurcation. Partially retropharyngeal course of the ICA. Left carotid system: CCA and ICA patent within the neck without stenosis. No significant atherosclerotic disease. Medial course of the left common carotid artery. Vertebral arteries: Right vertebral artery slightly dominant. The vertebral arteries are patent within the neck bilaterally without significant stenosis. Skeleton: No acute bony abnormality. Reversal of the expected cervical lordosis. Cervical spondylosis without high-grade bony spinal canal narrowing. Redemonstrated sequela of prior C6-C7 ACDF. Other neck: No neck mass or cervical adenopathy. The right thyroid lobe is surgically absent. Upper chest: No consolidation within the imaged lung apices. Review of the MIP images confirms the above findings CTA HEAD  FINDINGS Anterior circulation: The intracranial internal carotid arteries are patent bilaterally without significant stenosis. The right middle and anterior cerebral arteries are patent without significant proximal stenosis. The left middle cerebral artery is patent without significant proximal stenosis. The left anterior cerebral artery is patent. Mild focal stenosis within the left A2 segment (series 11, image 208). No intracranial aneurysm is identified. Posterior circulation: The intracranial vertebral arteries are patent without significant stenosis, as is the basilar artery. The bilateral posterior cerebral arteries are patent without significant proximal stenosis. Posterior communicating arteries are poorly delineated and may be hypoplastic or absent bilaterally. Venous sinuses: Within limitations of contrast timing, no convincing thrombus. Anatomic variants:  As described Review of the MIP images confirms the above findings IMPRESSION: CT head: 1. No evidence of acute intracranial abnormality. 2. Mild generalized parenchymal atrophy and chronic small vessel ischemic disease. CTA neck: The common carotid, internal carotid and vertebral arteries are patent within the neck without significant stenosis. Mild atherosclerotic disease at the right carotid bifurcation. CTA head: 1. No intracranial large vessel occlusion or proximal high-grade arterial stenosis. 2. Mild focal stenosis within the A2 left anterior cerebral artery. Electronically Signed   By: Kellie Simmering DO   On: 02/20/2019 10:23   MR BRAIN WO CONTRAST  Result Date: 03/10/2019 CLINICAL DATA:  Intermittent right sided weakness and shaking EXAM: MRI HEAD WITHOUT CONTRAST TECHNIQUE: Multiplanar, multiecho pulse sequences of the brain and surrounding structures were obtained without intravenous contrast. Axial and coronal DWI sequences were obtained without intravenous contrast. Patient could not complete the remainder of the study. COMPARISON:  02/19/2019 FINDINGS: Diffusion-weighted imaging demonstrates no evidence of acute infarction. There is no significant mass effect. There is no hydrocephalus. IMPRESSION: Incomplete study demonstrates no evidence of acute infarction, significant mass effect, or hydrocephalus. Electronically Signed   By: Macy Mis M.D.   On: 03/10/2019 14:54   MR BRAIN WO CONTRAST  Result Date: 02/19/2019 CLINICAL DATA:  Unexplained altered level of consciousness. Right-sided weakness over the last 3 weeks. EXAM: MRI HEAD WITHOUT CONTRAST TECHNIQUE: Multiplanar, multiecho pulse sequences of the brain and surrounding structures were obtained without intravenous contrast. COMPARISON:  Head CT 03/04/2018.  MRI 08/12/2014. FINDINGS: Brain: Diffusion imaging does not show any acute or subacute infarction. The brainstem and cerebellum are normal. Cerebral hemispheres show mild chronic  small-vessel ischemic change of the white matter. No cortical or large vessel territory infarction. No mass lesion, hemorrhage, hydrocephalus or extra-axial collection. Vascular: Major vessels at the base of the brain show flow. Skull and upper cervical spine: Negative Sinuses/Orbits: Clear/normal Other: None IMPRESSION: No acute or reversible finding. Mild chronic small-vessel ischemic change of the white matter. No specific cause of the clinical presentation is identified. Electronically Signed   By: Nelson Chimes M.D.   On: 02/19/2019 18:40   MR Cervical Spine Wo Contrast  Result Date: 02/19/2019 CLINICAL DATA:  Three-week history of right-sided weakness which is worsening. EXAM: MRI CERVICAL SPINE WITHOUT CONTRAST TECHNIQUE: Multiplanar, multisequence MR imaging of the cervical spine was performed. No intravenous contrast was administered. COMPARISON:  CT 09/01/2016 FINDINGS: Alignment: Straightening of the normal cervical lordosis. Vertebrae: Previous ACDF C6-7 has a good appearance. Cord: No cord compression or primary cord lesion. Posterior Fossa, vertebral arteries, paraspinal tissues: Negative Disc levels: No abnormality at the foramen magnum or C1-2. C2-3: Shallow central disc protrusion indents the thecal sac does not compress the cord or show foraminal extension. C3-4: Right-sided predominant osteophytes and bulging disc material efface the ventral subarachnoid space but  do not compress the cord or show foraminal encroachment. C4-5: Endplate osteophytes and bulging of the disc narrows the ventral subarachnoid space but do not compress the cord or show foraminal encroachment. C5-6: Spondylosis with endplate osteophytes and bulging of the disc. Narrowing of the ventral subarachnoid space but no cord compression. Mild foraminal narrowing on the right and moderate foraminal narrowing on the left. C6-7: Good appearance following previous ACDF. C7-T1: Normal interspace. IMPRESSION: No definite cause of the  presenting symptoms is identified. Good appearance of the previous ACDF level of C6-7. Degenerative changes from C2-3 through C5-6 with osteophytes and bulging disc material narrowing the ventral subarachnoid space but not compressing the cord. There is only mild foraminal narrowing, with exception of the left foramen at C5-6 which is moderately narrowed. Electronically Signed   By: Nelson Chimes M.D.   On: 02/19/2019 18:45   DG Chest Port 1 View  Result Date: 03/09/2019 CLINICAL DATA:  Weakness.  Rhonchi. EXAM: PORTABLE CHEST 1 VIEW COMPARISON:  December 19, 2018 FINDINGS: Cardiomegaly. Diffuse bilateral pulmonary opacities. Prominence of the mediastinum is likely due to the portable technique. No pneumothorax. No other acute abnormalities. IMPRESSION: Findings are most consistent with cardiomegaly and at least moderate pulmonary edema. Mediastinal prominence is likely due to portable technique. Recommend a PA and lateral chest x-ray before discharge. Electronically Signed   By: Dorise Bullion III M.D   On: 03/09/2019 18:05   ECHOCARDIOGRAM COMPLETE  Result Date: 02/20/2019   ECHOCARDIOGRAM REPORT   Patient Name:   [name] Date of Exam: 02/20/2019 Medical Rec #:  [id number]   Height:       66.0 in Accession #:    [id number]  Weight:       279.0 lb Date of Birth:  [birth year]   BSA:          2.30 m Patient Age:    40 years    BP:           174/105 mmHg Patient Gender: F           HR:           88 bpm. Exam Location:  Inpatient Procedure: 2D Echo Indications:    Stroke  History:        Patient has prior history of Echocardiogram examinations, most                 recent 06/14/2014. COPD; Risk Factors:Hypertension and Diabetes.  Sonographer:    Mikki Santee RDCS (AE) Referring Phys: [id number] TRINITY VERA IMPRESSIONS  1. Left ventricular ejection fraction, by visual estimation, is 65 to 70%. The left ventricle has normal function. There is no left ventricular hypertrophy.  2. Left ventricular diastolic  parameters are consistent with Grade I diastolic dysfunction (impaired relaxation).  3. Global right ventricle has normal systolic function.The right ventricular size is normal. No increase in right ventricular wall thickness.  4. Left atrial size was normal.  5. Right atrial size was normal.  6. The mitral valve is normal in structure. No evidence of mitral valve regurgitation. No evidence of mitral stenosis.  7. The tricuspid valve is normal in structure. Tricuspid valve regurgitation is not demonstrated.  8. The aortic valve is normal in structure. Aortic valve regurgitation is not visualized. No evidence of aortic valve sclerosis or stenosis.  9. The pulmonic valve was normal in structure. Pulmonic valve regurgitation is trivial. 10. TR signal is inadequate for assessing pulmonary artery systolic pressure. FINDINGS  Left Ventricle: Left ventricular  ejection fraction, by visual estimation, is 65 to 70%. The left ventricle has normal function. There is no left ventricular hypertrophy. Left ventricular diastolic parameters are consistent with Grade I diastolic dysfunction (impaired relaxation). Normal left atrial pressure. Right Ventricle: The right ventricular size is normal. No increase in right ventricular wall thickness. Global RV systolic function is has normal systolic function. Left Atrium: Left atrial size was normal in size. Right Atrium: Right atrial size was normal in size Pericardium: There is no evidence of pericardial effusion. Mitral Valve: The mitral valve is normal in structure. No evidence of mitral valve stenosis by observation. No evidence of mitral valve regurgitation. Tricuspid Valve: The tricuspid valve is normal in structure. Tricuspid valve regurgitation is not demonstrated. Aortic Valve: The aortic valve is normal in structure. Aortic valve regurgitation is not visualized. The aortic valve is structurally normal, with no evidence of sclerosis or stenosis. Pulmonic Valve: The pulmonic valve  was normal in structure. Pulmonic valve regurgitation is trivial. Aorta: The aortic root, ascending aorta and aortic arch are all structurally normal, with no evidence of dilitation or obstruction. Venous: The inferior vena cava was not well visualized. IAS/Shunts: No atrial level shunt detected by color flow Doppler. There is no evidence of a patent foramen ovale. No ventricular septal defect is seen or detected. There is no evidence of an atrial septal defect.  LEFT VENTRICLE PLAX 2D LVIDd:         4.30 cm  Diastology LVIDs:         3.20 cm  LV e' lateral:   5.87 cm/s LV PW:         0.90 cm  LV E/e' lateral: 10.3 LV IVS:        1.00 cm  LV e' medial:    5.55 cm/s LVOT diam:     2.00 cm  LV E/e' medial:  10.9 LV SV:         42 ml LV SV Index:   16.87 LVOT Area:     3.14 cm  RIGHT VENTRICLE RV S prime:     16.20 cm/s LEFT ATRIUM           Index       RIGHT ATRIUM          Index LA diam:      2.60 cm 1.13 cm/m  RA Area:     8.41 cm LA Vol (A2C): 29.0 ml 12.59 ml/m RA Volume:   14.30 ml 6.21 ml/m LA Vol (A4C): 23.5 ml 10.20 ml/m  AORTIC VALVE LVOT Vmax:   110.00 cm/s LVOT Vmean:  80.300 cm/s LVOT VTI:    0.220 m  AORTA Ao Root diam: 2.90 cm MITRAL VALVE MV Area (PHT): 3.46 cm             SHUNTS MV PHT:        63.51 msec           Systemic VTI:  0.22 m MV Decel Time: 219 msec             Systemic Diam: 2.00 cm MV E velocity: 60.60 cm/s 103 cm/s MV A velocity: 86.30 cm/s 70.3 cm/s MV E/A ratio:  0.70       1.5  Fransico Him MD Electronically signed by Fransico Him MD Signature Date/Time: 02/20/2019/12:24:34 PM    Final    [place] EKG SITE RITE  Result Date: 03/11/2019 If Site Rite image not attached, placement could not be confirmed due to current cardiac rhythm.  Microbiology: Recent Results (from the past 240 hour(s))  Blood culture (routine x 2)     Status: None   Collection Time: 03/09/19  7:01 PM   Specimen: BLOOD LEFT ARM  Result Value Ref Range Status   Specimen Description BLOOD LEFT ARM UPPER   Final   Special Requests   Final    BOTTLES DRAWN AEROBIC AND ANAEROBIC Blood Culture results may not be optimal due to an inadequate volume of blood received in culture bottles   Culture   Final    NO GROWTH 5 DAYS Performed at [facility], [place] [address] [place], [place] [address]    Report Status 03/14/2019 FINAL  Final  Respiratory Panel by RT PCR (Flu A&B, Covid) - Nasopharyngeal Swab     Status: Abnormal   Collection Time: 03/09/19  7:51 PM   Specimen: Nasopharyngeal Swab  Result Value Ref Range Status   SARS Coronavirus 2 by RT PCR POSITIVE (A) NEGATIVE Final    Comment: RESULT CALLED TO, READ BACK BY AND VERIFIED WITH: RN T [facility] @0026  03/10/19 BY S GEZAHEGN (NOTE) SARS-CoV-2 target nucleic acids are DETECTED. SARS-CoV-2 RNA is generally detectable in upper respiratory specimens  during the acute phase of infection. Positive results are indicative of the presence of the identified virus, but do not rule out bacterial infection or co-infection with other pathogens not detected by the test. Clinical correlation with patient history and other diagnostic information is necessary to determine patient infection status. The expected result is Negative. Fact Sheet for Patients:  PinkCheek.be Fact Sheet for Healthcare Providers: GravelBags.it This test is not yet approved or cleared by the [place] FDA and  has been authorized for detection and/or diagnosis of SARS-CoV-2 by FDA under an Emergency Use Authorization (EUA).  This EUA will remain in effect (meaning this test can be [place] ed) for the duration of  the COVID-19 declaration under Section 564(b)(1) of the Act, 21 U.S.C. section 360bbb-3(b)(1), unless the authorization is terminated or revoked sooner.    Influenza A by PCR NEGATIVE NEGATIVE Final   Influenza B by PCR NEGATIVE NEGATIVE Final    Comment: (NOTE) The Xpert Xpress SARS-CoV-2/FLU/RSV assay is  intended as an aid in  the diagnosis of influenza from Nasopharyngeal swab specimens and  should not be used as a sole basis for treatment. Nasal washings and  aspirates are unacceptable for Xpert Xpress SARS-CoV-2/FLU/RSV  testing. Fact Sheet for Patients: PinkCheek.be Fact Sheet for Healthcare Providers: GravelBags.it This test is not yet approved or cleared by the [place] FDA and  has been authorized for detection and/or diagnosis of SARS-CoV-2 by  FDA under an Emergency Use Authorization (EUA). This EUA will remain  in effect (meaning this test can be used) for the duration of the  Covid-19 declaration under Section 564(b)(1) of the Act, 21  U.S.C. section 360bbb-3(b)(1), unless the authorization is  terminated or revoked. Performed at [facility], [place] [address], [place], [place] [address]      Labs: Basic Metabolic Panel: Recent Labs  Lab 03/11/19 0522 03/12/19 0203 03/13/19 0500 03/13/19 1612 03/14/19 0320 03/14/19 1603 03/14/19 1720 03/15/19 0600 03/16/19 0550  NA 150* 150* 152* 152* 150* 146* 144 141 136  K 3.6 3.5 3.0* 3.6 3.4* 3.6 3.8 3.4* 3.5  CL 109 112* 114* 115* 113*  --  108 105 96*  CO2 23 24 25 25 26   --  25 24 24   GLUCOSE 240* 212* 184* 263* 176*  --  277* 151* 167*  BUN 26* 32* 40* 33* 31*  --  23 18 17   CREATININE 0.94 1.06* 1.03* 0.96 0.89  --  0.78 0.70 0.67  CALCIUM 7.7* 7.8* 7.8* 8.0* 8.0*  --  7.8* 7.7* 7.6*  MG 2.2 2.3 2.1  --  1.9  --   --  1.4*  --    Liver Function Tests: Recent Labs  Lab 03/11/19 0522 03/12/19 0203 03/13/19 0500 03/14/19 0320 03/15/19 0600  AST 88* 52* 48* 50* 41  ALT 85* 72* 61* 59* 50*  ALKPHOS 98 88 82 73 66  BILITOT 1.2 0.9 0.5 0.9 0.5  PROT 9.2* 8.8* 8.2* 8.3* 7.2  ALBUMIN 3.5 3.1* 3.0* 3.1* 2.8*   No results for input(s): LIPASE, AMYLASE in the last 168 hours. Recent Labs  Lab 03/14/19 1720 03/16/19 0550  AMMONIA 47* 53*    CBC: Recent Labs  Lab 03/11/19 0522 03/12/19 0203 03/13/19 0500 03/14/19 0320 03/14/19 1603 03/15/19 0600 03/16/19 0550  WBC 11.6* 9.0 10.2 9.8  --  9.9 9.9  NEUTROABS 10.0* 7.2 7.7 7.4  --  7.1  --   HGB 12.4 12.0 11.8* 11.6* 12.2 11.2* 10.9*  HCT 39.2 37.8 38.1 37.6 36.0 36.0 34.7*  MCV 82.9 82.9 83.0 84.5  --  83.7 83.0  PLT 519* 443* 537* 456*  --  357 308   Cardiac Enzymes: No results for input(s): CKTOTAL, CKMB, CKMBINDEX, TROPONINI in the last 168 hours. BNP: BNP (last 3 results) Recent Labs    03/09/19 1901 03/13/19 1612  BNP 40.8 64.8    ProBNP (last 3 results) No results for input(s): PROBNP in the last 8760 hours.  CBG: Recent Labs  Lab 03/15/19 0827 03/15/19 1202 03/15/19 1744 03/15/19 2144 03/16/19 0744  GLUCAP 151* 220* 247* 214* 169*       Signed:  Annita Brod, MD [facility] 03/16/2019, 2:57 PM

## 2019-03-16 NOTE — Progress Notes (Signed)
ANTICOAGULATION CONSULT NOTE   Pharmacy Consult for Warfarin Indication: History of recurrent DVTs and Lupus Anticoagulant  Patient Measurements: Height: 5\' 6"  (167.6 cm) Weight: 272 lb 7.8 oz (123.6 kg) IBW/kg (Calculated) : 59.3  Vital Signs: Temp: 97.9 F (36.6 C) (12/27 0500) Temp Source: Oral (12/27 0500) BP: 150/98 (12/27 0740) Pulse Rate: 85 (12/27 0740)  Labs: Recent Labs    03/14/19 0320 03/14/19 0320 03/14/19 1603 03/14/19 1720 03/15/19 0600 03/16/19 0550  HGB 11.6*   < > 12.2  --  11.2* 10.9*  HCT 37.6  --  36.0  --  36.0 34.7*  PLT 456*  --   --   --  357 308  LABPROT 23.5*  --   --   --  26.9* 26.8*  INR 2.1*  --   --   --  2.5* 2.5*  CREATININE 0.89  --   --  0.78 0.70 0.67   < > = values in this interval not displayed.    Estimated Creatinine Clearance: 96.6 mL/min (by C-G formula based on SCr of 0.67 mg/dL).    Assessment: 63 y/o F in the ED with altered mental status, on warfarin PTA for hx of DVT. Admit INR 1.9 and a lovenox bridge was started. The patient's INR has trended up to 2.2 today and pharmacy has been consulted to discontinue lovenox and resume Warfarin.   PTA Warfarin dose 5 mg daily EXCEPT for 10 mg on MWF  INR 2.1 today. Last dose per PTA med list was on 12/19. The patient did receive 1 dose of Flagyl on 12/20 which is known to increase Warfarin sensitivity  03/16/2019 INR 2.5, therapeutic H/H stable, Plts ok  [place] of Therapy:  INR 2-3 Monitor platelets by anticoagulation protocol: Yes   Plan:  - Warfarin 5 mg x 1 dose at 1800 today - Daily PT/INR, CBC q72h - Will continue to monitor for any signs/symptoms of bleeding  Ulice Dash, PharmD, BCPS 03/16/2019, 11:51 AM

## 2019-03-16 NOTE — Discharge Instructions (Signed)
Isolate until January 10th.     Person Under Monitoring Name: [name]  Location: [place] [address]   Infection Prevention Recommendations for Individuals Confirmed to have, or Being Evaluated for, 2019 Novel Coronavirus (COVID-19) Infection Who Receive Care at Home  Individuals who are confirmed to have, or are being evaluated for, COVID-19 should follow the prevention steps below until a healthcare provider or local or state health department says they can return to normal activities.  Stay home except to get medical care You should restrict activities outside your home, except for getting medical care. Do not go to work, school, or public areas, and do not use public transportation or taxis.  Call ahead before visiting your doctor Before your medical appointment, call the healthcare provider and tell them that you have, or are being evaluated for, COVID-19 infection. This will help the healthcare provider's office take steps to keep other people from getting infected. Ask your healthcare provider to call the local or state health department.  Monitor your symptoms Seek prompt medical attention if your illness is worsening (e.g., difficulty breathing). Before going to your medical appointment, call the healthcare provider and tell them that you have, or are being evaluated for, COVID-19 infection. Ask your healthcare provider to call the local or state health department.  Wear a facemask You should wear a facemask that covers your nose and mouth when you are in the same room with other people and when you visit a healthcare provider. People who live with or visit you should also wear a facemask while they are in the same room with you.  Separate yourself from other people in your home As much as possible, you should stay in a different room from other people in your home. Also, you should use a separate bathroom, if available.  Avoid sharing  household items You should not share dishes, drinking glasses, cups, eating utensils, towels, bedding, or other items with other people in your home. After using these items, you should wash them thoroughly with soap and water.  Cover your coughs and sneezes Cover your mouth and nose with a tissue when you cough or sneeze, or you can cough or sneeze into your sleeve. Throw used tissues in a lined trash can, and immediately wash your hands with soap and water for at least 20 seconds or use an alcohol-based hand rub.  Wash your [facility] your hands often and thoroughly with soap and water for at least 20 seconds. You can use an alcohol-based hand sanitizer if soap and water are not available and if your hands are not visibly dirty. Avoid touching your eyes, nose, and mouth with unwashed hands.   Prevention Steps for Caregivers and Household Members of Individuals Confirmed to have, or Being Evaluated for, COVID-19 Infection Being Cared for in the Home  If you live with, or provide care at home for, a person confirmed to have, or being evaluated for, COVID-19 infection please follow these guidelines to prevent infection:  Follow healthcare provider's instructions Make sure that you understand and can help the patient follow any healthcare provider instructions for all care.  Provide for the patient's basic needs You should help the patient with basic needs in the home and provide support for getting groceries, prescriptions, and other personal needs.  Monitor the patient's symptoms If they are getting sicker, call his or her medical provider and tell them that the patient has, or is being evaluated for,  COVID-19 infection. This will help the healthcare provider's office take steps to keep other people from getting infected. Ask the healthcare provider to call the local or state health department.  Limit the number of people who have contact with the patient  If possible, have only  one caregiver for the patient.  Other household members should stay in another home or place of residence. If this is not possible, they should stay  in another room, or be separated from the patient as much as possible. Use a separate bathroom, if available.  Restrict visitors who do not have an essential need to be in the home.  Keep older adults, very young children, and other sick people away from the patient Keep older adults, very young children, and those who have compromised immune systems or chronic health conditions away from the patient. This includes people with chronic heart, lung, or kidney conditions, diabetes, and cancer.  Ensure good ventilation Make sure that shared spaces in the home have good air flow, such as from an air conditioner or an opened window, weather permitting.  Wash your hands often  Wash your hands often and thoroughly with soap and water for at least 20 seconds. You can use an alcohol based hand sanitizer if soap and water are not available and if your hands are not visibly dirty.  Avoid touching your eyes, nose, and mouth with unwashed hands.  Use disposable paper towels to dry your hands. If not available, use dedicated cloth towels and replace them when they become wet.  Wear a facemask and gloves  Wear a disposable facemask at all times in the room and gloves when you touch or have contact with the patient's blood, body fluids, and/or secretions or excretions, such as sweat, saliva, sputum, nasal mucus, vomit, urine, or feces.  Ensure the mask fits over your nose and mouth tightly, and do not touch it during use.  Throw out disposable facemasks and gloves after using them. Do not reuse.  Wash your hands immediately after removing your facemask and gloves.  If your personal clothing becomes contaminated, carefully remove clothing and launder. Wash your hands after handling contaminated clothing.  Place all used disposable facemasks, gloves, and  other waste in a lined container before disposing them with other household waste.  Remove gloves and wash your hands immediately after handling these items.  Do not share dishes, glasses, or other household items with the patient  Avoid sharing household items. You should not share dishes, drinking glasses, cups, eating utensils, towels, bedding, or other items with a patient who is confirmed to have, or being evaluated for, COVID-19 infection.  After the person uses these items, you should wash them thoroughly with soap and water.  Wash laundry thoroughly  Immediately remove and wash clothes or bedding that have blood, body fluids, and/or secretions or excretions, such as sweat, saliva, sputum, nasal mucus, vomit, urine, or feces, on them.  Wear gloves when handling laundry from the patient.  Read and follow directions on labels of laundry or clothing items and detergent. In general, wash and dry with the warmest temperatures recommended on the label.  Clean all areas the individual has used often  Clean all touchable surfaces, such as counters, tabletops, doorknobs, bathroom fixtures, toilets, phones, keyboards, tablets, and bedside tables, every day. Also, clean any surfaces that may have blood, body fluids, and/or secretions or excretions on them.  Wear gloves when cleaning surfaces the patient has come in contact with.  Use a  diluted bleach solution (e.g., dilute bleach with 1 part bleach and 10 parts water) or a household disinfectant with a label that says EPA-registered for coronaviruses. To make a bleach solution at home, add 1 tablespoon of bleach to 1 quart (4 cups) of water. For a larger supply, add  cup of bleach to 1 gallon (16 cups) of water.  Read labels of cleaning products and follow recommendations provided on product labels. Labels contain instructions for safe and effective use of the cleaning product including precautions you should take when applying the product,  such as wearing gloves or eye protection and making sure you have good ventilation during use of the product.  Remove gloves and wash hands immediately after cleaning.  Monitor yourself for signs and symptoms of illness Caregivers and household members are considered close contacts, should monitor their health, and will be asked to limit movement outside of the home to the extent possible. Follow the monitoring steps for close contacts listed on the symptom monitoring form.   ? If you have additional questions, contact your local health department or call the epidemiologist on call at [phone] (available 24/7). ? This guidance is subject to change. For the most up-to-date guidance from [facility], please refer to their website: YouBlogs.pl

## 2019-03-16 NOTE — Plan of Care (Signed)
  Problem: Education: [place]: Knowledge of risk factors and measures for prevention of condition will improve Outcome: Adequate for Discharge   Problem: Coping: [place]: Psychosocial and spiritual needs will be supported Outcome: Adequate for Discharge   Problem: Respiratory: [place]: Will maintain a patent airway Outcome: Adequate for Discharge [place]: Complications related to the disease process, condition or treatment will be avoided or minimized Outcome: Adequate for Discharge   

## 2019-03-16 NOTE — Progress Notes (Signed)
Foley removed. Patient tolerated well.

## 2019-03-16 NOTE — TOC Transition Note (Signed)
Transition of Care [facility]) - CM/SW Discharge Note   Patient Details  Name: [name] MRN: [id number] Date of Birth: [birth year]  [facility] CM/SW Contact:  Geralynn Ochs, LCSW Phone Number: 03/16/2019, 4:34 PM   Clinical Narrative:  CSW spoke with RN, who handed phone to patient for CSW to talk about getting her home. Patient agreeable to home health, says she had used [facility] in the past. Patient says her daughter will come and pick her up, provided permission to call her daughter. CSW spoke with Levada Dy who was running errands before patient comes home, and Psychologist, occupational of update. Levada Dy will come and pick up patient to provide transport home.  CSW spoke with [place] with [facility] and they are unable to accept patient at this time, they will be unable to provide therapy until patient is outside of quarantine. CSW reached out to [place] and [facility] to see if they would be able to accept patient for home health, awaiting response. CSW updated RN with when daughter would arrive to pick up the patient.      Final next level of care: [place] Barriers to Discharge: Barriers Resolved   Patient Goals and CMS Choice Patient states their goals for this hospitalization and ongoing recovery are:: to get back home CMS Medicare.gov Compare Post Acute Care list provided to:: Patient Choice offered to / list presented to : Patient  Discharge Placement                Patient to be transferred to facility by: Family car Name of family member notified: Levada Dy Patient and family notified of of transfer: 03/16/19  Discharge Plan and Services                          HH Arranged: PT          Social Determinants of Health (SDOH) Interventions     Readmission Risk Interventions No flowsheet data found.

## 2019-03-17 LAB — GLUCOSE, CAPILLARY: Glucose-Capillary: 230 mg/dL — ABNORMAL HIGH (ref 70–99)

## 2019-03-20 ENCOUNTER — Telehealth: Payer: Self-pay | Admitting: Oncology

## 2019-03-20 ENCOUNTER — Telehealth: Payer: Self-pay | Admitting: *Deleted

## 2019-03-20 NOTE — Telephone Encounter (Signed)
R/s appt per 12/31 shc message - pt is aware of appt date and time

## 2019-03-20 NOTE — Telephone Encounter (Signed)
Left VM that she has COVID and pneumonia and needs medicine from Dr. Benay Spice. Called back after review of discharge summary from 03/16/2019 and instructed her to f/u with her PCP office per d/c instructions.  Scheduling message sent to move her 1/8 lab to after 1/10 due to [place] quarantine per d/C summary.

## 2019-03-25 ENCOUNTER — Ambulatory Visit: Payer: Medicare Other | Admitting: Podiatry

## 2019-03-26 ENCOUNTER — Ambulatory Visit: Payer: Medicare Other | Admitting: Orthopedic Surgery

## 2019-03-28 ENCOUNTER — Other Ambulatory Visit: Payer: Medicaid Other

## 2019-04-01 ENCOUNTER — Telehealth: Payer: Self-pay | Admitting: Oncology

## 2019-04-01 NOTE — Telephone Encounter (Signed)
Returned patient's phone call regarding cancelling 01/14 appointment, patient will call once she feels better to reschedule.

## 2019-04-03 ENCOUNTER — Other Ambulatory Visit: Payer: Medicaid Other

## 2019-04-04 ENCOUNTER — Ambulatory Visit: Payer: Medicare Other | Attending: Internal Medicine

## 2019-04-04 ENCOUNTER — Ambulatory Visit: Payer: Medicare Other | Admitting: Orthopedic Surgery

## 2019-04-04 DIAGNOSIS — Z20822 Contact with and (suspected) exposure to covid-19: Secondary | ICD-10-CM

## 2019-04-05 LAB — NOVEL CORONAVIRUS, NAA: SARS-CoV-2, NAA: NOT DETECTED

## 2019-04-09 ENCOUNTER — Other Ambulatory Visit: Payer: Self-pay | Admitting: Oncology

## 2019-04-10 ENCOUNTER — Other Ambulatory Visit: Payer: Self-pay

## 2019-04-16 ENCOUNTER — Ambulatory Visit
Admission: RE | Admit: 2019-04-16 | Discharge: 2019-04-16 | Disposition: A | Discharge status: Home / Self Care | Payer: Medicare Other | Source: Ambulatory Visit | Attending: Orthopedic Surgery | Admitting: Orthopedic Surgery

## 2019-04-16 ENCOUNTER — Other Ambulatory Visit: Payer: Self-pay

## 2019-04-16 DIAGNOSIS — M25561 Pain in right knee: Secondary | ICD-10-CM

## 2019-04-16 DIAGNOSIS — M25562 Pain in left knee: Secondary | ICD-10-CM

## 2019-04-18 ENCOUNTER — Ambulatory Visit (INDEPENDENT_AMBULATORY_CARE_PROVIDER_SITE_OTHER): Payer: Medicare Other | Admitting: Orthopedic Surgery

## 2019-04-18 ENCOUNTER — Other Ambulatory Visit: Payer: Self-pay

## 2019-04-18 DIAGNOSIS — M5410 Radiculopathy, site unspecified: Secondary | ICD-10-CM

## 2019-04-20 ENCOUNTER — Encounter: Payer: Self-pay | Admitting: Orthopedic Surgery

## 2019-04-20 NOTE — Progress Notes (Signed)
Office Visit Note   Patient: [name]           Date of Birth: 11-09-1955           MRN: [id number] Visit Date: 04/18/2019 Requested by: Loretha Brasil, [address],  [place] [address] PCP: Loretha Brasil, FNP  Subjective: Chief Complaint  Patient presents with  . Follow-up    HPI: [name] is a 64 y.o. female who presents to the office complaining of bilateral leg pain.  She returns to discuss MRI L-spine results.  Patient had lumbar spine MRI on 04/16/2019 that revealed prior fusion at L4-L5 without recurrent spinal stenosis, moderate to severe bilateral facet arthropathy at L2-3 L4-L5 S1 with mild progression compared with 2018, disc bulging with facet hypertrophy at L3-L4 with mild to moderate canal and right-sided L3 foraminal stenosis.  She notes no significant improvement in her symptoms since she was last seen.  Since her last office visit she has had continued bilateral leg pain with low back pain.  Her leg pain radiates from the back down the posterior aspect of her bilateral legs and stops at the posterior knee.  She does note some subjective weakness of bilateral legs.  She has a history of fusion by Dr. Cyndy Freeze.  She has not tried physical therapy.  S she takes tramadol with some relief.  He has had an unpleasant experience with epidural steroid injections in the past.  Since her last office visit she was diagnosed with COVID-19.  She had to be hospitalized for 6 to 7 days and quarantine for 14 days following her hospitalization.  She has some persisting shortness of breath but overall feels to be recovering..                ROS:  All systems reviewed are negative as they relate to the chief complaint within the history of present illness.  Patient denies fevers or chills.  Assessment & Plan: Visit Diagnoses:  1. Radicular pain of right lower extremity   2. Radicular pain of left lower extremity     Plan: Patient is a 64 year old female  who presents complaining of bilateral leg pain.  She returns to discuss MRI results.  MRI results are as found above in HPI.  She has continued symptoms that have not improved since her last office visit.  Discussed options available to patient based on her MRI results.  She does not want to try epidural steroid injections as she had a painful experience in the past.  Also the patient is on anticoagulation which makes that complicated.  She has not done any physical therapy.  Patient request to see Dr. Cyndy Freeze to talk about options.  We will refer her to Dr. Cyndy Freeze and patient may follow-up with our office as needed.  Follow-Up Instructions: No follow-ups on file.   Orders:  Orders Placed This Encounter  Procedures  . Ambulatory referral to Neurosurgery   No orders of the defined types were placed in this encounter.     Procedures: No procedures performed   Clinical Data: No additional findings.  Objective: Vital Signs: There were no vitals taken for this visit.  Physical Exam:  Constitutional: Patient appears well-developed HEENT:  Head: Normocephalic Eyes:EOM are normal Neck: Normal range of motion Cardiovascular: Normal rate Pulmonary/chest: Effort normal Neurologic: Patient is alert Skin: Skin is warm Psychiatric: Patient has normal mood and affect  Ortho Exam:  Tender to palpation throughout the  axial lumbar spine.  Mild tenderness to palpation throughout the paraspinal musculature in the lumbar spine.  5/5 motor strength of the bilateral hip flexors, quadriceps, hamstring, dorsiflexion, plantarflexion.  Sensation intact through all dermatomes of the bilateral lower extremities.  Specialty Comments:  No specialty comments available.  Imaging: No results found.   PMFS History: Patient Active Problem List   Diagnosis Date Noted  . Chronic diastolic CHF (congestive heart failure) ([place]) 03/12/2019  . Acute respiratory failure with hypoxia ([facility]) 03/09/2019  .  Hypernatremia 03/09/2019  . Suspected COVID-19 virus infection 03/09/2019  . Acute encephalopathy 03/09/2019  . Right sided weakness 02/20/2019  . Thyroid mass 02/27/2017  . Chronic pain of right knee 05/24/2016  . Effusion, right knee 04/28/2016  . Presence of right artificial knee joint 04/28/2016  . Abnormal LFTs   . Decreased sensation   . Paresthesia 06/12/2014  . Obstruction of kidney 04/24/2014  . Diabetes type 2, uncontrolled ([facility]) 04/24/2014  . Chronic anticoagulation 04/24/2014  . Crack cocaine use 04/24/2014  . Depression 04/24/2014  . Fibromyalgia 04/24/2014  . COPD (chronic obstructive pulmonary disease) ([facility]) 04/24/2014  . Obstructive sleep apnea on CPAP 04/24/2014  . Lupus anticoagulant positive 04/24/2014  . Adrenal mass, right ([facility]) 04/24/2014  . Right kidney mass 04/24/2014  . Hydronephrosis of right kidney 04/24/2014  . Supratherapeutic INR 04/24/2014  . Renal mass, right 04/24/2014  . Diabetes type 2, controlled ([place] [facility])   . Right lower quadrant abdominal pain   . Morbid obesity ([facility]) 08/21/2013  . Unspecified constipation 08/19/2013  . COPD with acute exacerbation ([facility] [place]) 08/17/2013  . Morbid obesity with BMI of 45.0-49.9, adult ([facility]) 01/05/2012  . Bilateral deep vein thromboses ([place]) 12/13/2011  . Abdominal pain 11/05/2011  . Hypokalemia 11/05/2011  . Sciatica 11/05/2011  . Diabetes mellitus type 2 in obese ([facility]) 11/05/2011  . HTN (hypertension) 11/05/2011   Past Medical History:  Diagnosis Date  . Adrenal mass ([facility]) 03/226   Benign  . Arthritis    knees/multiple orthopedic conditons; lower back  . Asthma    per pt  . 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 Depression   . Diabetes mellitus    120s usually fasting -  dx more than 10 yrs ago  . Dizziness, nonspecific   . DVT (deep venous thrombosis) ([facility])    Recurrent  . Fibromyalgia    . GERD (gastroesophageal reflux disease)   . Heart murmur   . History of blood transfusion   . History of cocaine abuse ([place])    Remote history   . Hypertension    takes meds daily  . Hypothyroidism   . Lupus Anticoagulant Positive   . On home oxygen therapy    at night  . Shortness of breath    exertion or lying flat  . Sleep apnea    2l of oxygen at night (as of 12/6, she used to)    Family History  Problem Relation Age of Onset  . Hypertension Mother   . Diabetes Mother   . Cancer Mother        Pancreatic  . Hypertension Father   . Heart failure Father     Past Surgical History:  Procedure Laterality Date  . ABDOMINAL HYSTERECTOMY  1989   Fibroids  . ANTERIOR LUMBAR FUSION  01/03/2012   Procedure: ANTERIOR LUMBAR FUSION 1 LEVEL;  Surgeon: Winfield Cunas, MD;  Location: [facility];  Service: Neurosurgery;  Laterality: N/A;  Lumbar Four-Five Anterior Lumbar Interbody Fusion with Instrumentation  . BACK SURGERY     cervical spine---disk disease  . CARPAL TUNNEL RELEASE     Bilateral  . CESAREAN SECTION    . CHOLECYSTECTOMY    . CYSTOSCOPY/RETROGRADE/URETEROSCOPY/STONE EXTRACTION WITH BASKET Right 04/26/2014   Procedure: CYSTOSCOPY/ RIGHT RETROGRADE/ RIGHT URETEROSCOPY/URETERAL AND RENAL PELVIS BIOPSY;  Surgeon: Alexis Frock, MD;  Location: [facility];  Service: Urology;  Laterality: Right;  . gallstones removed    . 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   bilateral  . right elbow surgery    . THYROID LOBECTOMY Right 02/27/2017  . THYROID LOBECTOMY Right 02/27/2017   Procedure: RIGHT THYROID LOBECTOMY;  Surgeon: Erroll Luna, MD;  Location: [facility];  Service: General;  Laterality: Right;  . TUBAL LIGATION     Social History   Occupational History  . Not on file  Tobacco Use  . Smoking status: Former Smoker    Start date: 03/20/2001    Quit date: 03/21/2003    Years since quitting: 16.0  . Smokeless tobacco: Never Used  . Tobacco comment: smoked for 2 years    Substance and Sexual Activity  . Alcohol use: No    Alcohol/week: 0.0 standard drinks    Comment: beer in past   . Drug use: Yes    Types: Cocaine    Comment: quit 2003-rehab progam in 2005  . Sexual activity: Never

## 2019-04-23 ENCOUNTER — Other Ambulatory Visit: Payer: Self-pay

## 2019-04-23 ENCOUNTER — Encounter: Payer: Self-pay | Admitting: Family Medicine

## 2019-04-23 ENCOUNTER — Ambulatory Visit (INDEPENDENT_AMBULATORY_CARE_PROVIDER_SITE_OTHER): Payer: Medicare Other | Admitting: Family Medicine

## 2019-04-23 VITALS — BP 108/82 | HR 86 | Temp 96.9°F | Ht 66.0 in | Wt 273.0 lb

## 2019-04-23 DIAGNOSIS — E278 Other specified disorders of adrenal gland: Secondary | ICD-10-CM | POA: Diagnosis not present

## 2019-04-23 DIAGNOSIS — G4733 Obstructive sleep apnea (adult) (pediatric): Secondary | ICD-10-CM

## 2019-04-23 DIAGNOSIS — Z9989 Dependence on other enabling machines and devices: Secondary | ICD-10-CM

## 2019-04-23 DIAGNOSIS — J449 Chronic obstructive pulmonary disease, unspecified: Secondary | ICD-10-CM

## 2019-04-23 DIAGNOSIS — M5441 Lumbago with sciatica, right side: Secondary | ICD-10-CM

## 2019-04-23 DIAGNOSIS — Z7689 Persons encountering health services in other specified circumstances: Secondary | ICD-10-CM

## 2019-04-23 DIAGNOSIS — M25561 Pain in right knee: Secondary | ICD-10-CM

## 2019-04-23 DIAGNOSIS — I10 Essential (primary) hypertension: Secondary | ICD-10-CM

## 2019-04-23 DIAGNOSIS — G8929 Other chronic pain: Secondary | ICD-10-CM

## 2019-04-23 DIAGNOSIS — Z86718 Personal history of other venous thrombosis and embolism: Secondary | ICD-10-CM

## 2019-04-23 DIAGNOSIS — M5442 Lumbago with sciatica, left side: Secondary | ICD-10-CM

## 2019-04-23 DIAGNOSIS — I5032 Chronic diastolic (congestive) heart failure: Secondary | ICD-10-CM

## 2019-04-23 NOTE — Patient Instructions (Signed)
Chronic Knee Pain, Adult Chronic knee pain is pain in one or both knees that lasts longer than 3 months. Symptoms of chronic knee pain may include swelling, stiffness, and discomfort. Age-related wear and tear (osteoarthritis) of the knee joint is the most common cause of chronic knee pain. Other possible causes include:  A long-term immune-related disease that causes inflammation of the knee (rheumatoid arthritis). This usually affects both knees.  Inflammatory arthritis, such as gout or pseudogout.  An injury to the knee that causes arthritis.  An injury to the knee that damages the ligaments. Ligaments are strong tissues that connect bones to each other.  Runner's knee or pain behind the kneecap. Treatment for chronic knee pain depends on the cause. The main treatments for chronic knee pain are physical therapy and weight loss. This condition may also be treated with medicines, injections, a knee sleeve or brace, and by using crutches. Rest, ice, compression (pressure), and elevation (RICE) therapy may also be recommended. Follow these instructions at home: If you have a knee sleeve or brace:   Wear it as told by your health care provider. Remove it only as told by your health care provider.  Loosen it if your toes tingle, become numb, or turn cold and blue.  Keep it clean.  If the sleeve or brace is not waterproof: ? Do not let it get wet. ? Remove it if allowed by your health care provider, or cover it with a watertight covering when you take a bath or a shower. Managing pain, stiffness, and swelling      If directed, apply heat to the affected area as often as told by your health care provider. Use the heat source that your health care provider recommends, such as a moist heat pack or a heating pad. ? If you have a removable sleeve or brace, remove it as told by your health care provider. ? Place a towel between your skin and the heat source. ? Leave the heat on for 20-30  minutes. ? Remove the heat if your skin turns bright red. This is especially important if you are unable to feel pain, heat, or cold. You may have a greater risk of getting burned.  If directed, put ice on the affected area. ? If you have a removable sleeve or brace, remove it as told by your health care provider. ? Put ice in a plastic bag. ? Place a towel between your skin and the bag. ? Leave the ice on for 20 minutes, 2-3 times a day.  Move your toes often to reduce stiffness and swelling.  Raise (elevate) the injured area above the level of your heart while you are sitting or lying down. Activity  Avoid activities where both feet leave the ground at the same time (high-impact activities). Examples are running, jumping rope, and doing jumping jacks.  Return to your normal activities as told by your health care provider. Ask your health care provider what activities are safe for you.  Follow the exercise plan that your health care provider designed for you. Your health care provider may suggest that you: ? Avoid activities that make knee pain worse. This may require you to change your exercise routines, sport participation, or job duties. ? Wear shoes with cushioned soles. ? Avoid high-impact activities or sports that require running and sudden changes in direction. ? Do physical therapy as told by your health care provider. Physical therapy is planned to match your needs and abilities. It may include  exercises for strength, flexibility, stability, and endurance. ? Do exercises that increase balance and strength, such as tai chi and yoga.  Do not use the injured limb to support your body weight until your health care provider says that you can. Use crutches, a cane, or a walker, as told by your health care provider. General instructions  Take over-the-counter and prescription medicines only as told by your health care provider.  Lose weight if you are overweight. Losing even a little  weight can reduce knee pain. Ask your health care provider what your ideal weight is, and how to safely lose extra weight. A food expert (dietitian) may be able to help you plan your meals.  Do not use any products that contain nicotine or tobacco, such as cigarettes, e-cigarettes, and chewing tobacco. These can delay healing. If you need help quitting, ask your health care provider.  Keep all follow-up visits as told by your health care provider. This is important. Contact a health care provider if:  You have knee pain that is not getting better or gets worse.  You are unable to do your physical therapy exercises due to knee pain. Get help right away if:  Your knee swells and the swelling becomes worse.  You cannot move your knee.  You have severe knee pain. Summary  Knee pain that lasts more than 3 months is considered chronic knee pain.  The main treatments for chronic knee pain are physical therapy and weight loss. You may also need to take medicines, wear a knee sleeve or brace, use crutches, and apply ice or heat.  Losing even a little weight can reduce knee pain. Ask your health care provider what your ideal weight is, and how to safely lose extra weight. A food expert (dietitian) may be able to help you plan your meals.  Work with a physical therapist to make a safe exercise program, as told by your health care provider. This information is not intended to replace advice given to you by your health care provider. Make sure you discuss any questions you have with your health care provider. Document Revised: 05/16/2018 Document Reviewed: 05/16/2018 Elsevier Patient Education  [place].  Chronic Obstructive Pulmonary Disease Chronic obstructive pulmonary disease (COPD) is a long-term (chronic) lung problem. When you have COPD, it is hard for air to get in and out of your lungs. Usually the condition gets worse over time, and your lungs will never return to normal. There  are things you can do to keep yourself as healthy as possible.  Your doctor may treat your condition with: ? Medicines. ? Oxygen. ? Lung surgery.  Your doctor may also recommend: ? Rehabilitation. This includes steps to make your body work better. It may involve a team of specialists. ? Quitting smoking, if you smoke. ? Exercise and changes to your diet. ? Comfort measures (palliative care). Follow these instructions at home: Medicines  Take over-the-counter and prescription medicines only as told by your doctor.  Talk to your doctor before taking any cough or allergy medicines. You may need to avoid medicines that cause your lungs to be dry. Lifestyle  If you smoke, stop. Smoking makes the problem worse. If you need help quitting, ask your doctor.  Avoid being around things that make your breathing worse. This may include smoke, chemicals, and fumes.  Stay active, but remember to rest as well.  Learn and use tips on how to relax.  Make sure you get enough sleep. Most adults  need at least 7 hours of sleep every night.  Eat healthy foods. Eat smaller meals more often. Rest before meals. Controlled breathing Learn and use tips on how to control your breathing as told by your doctor. Try:  Breathing in (inhaling) through your nose for 1 second. Then, pucker your lips and breath out (exhale) through your lips for 2 seconds.  Putting one hand on your belly (abdomen). Breathe in slowly through your nose for 1 second. Your hand on your belly should move out. Pucker your lips and breathe out slowly through your lips. Your hand on your belly should move in as you breathe out.  Controlled coughing Learn and use controlled coughing to clear mucus from your lungs. Follow these steps: 1. Lean your head a little forward. 2. Breathe in deeply. 3. Try to hold your breath for 3 seconds. 4. Keep your mouth slightly open while coughing 2 times. 5. Spit any mucus out into a tissue. 6. Rest  and do the steps again 1 or 2 times as needed. General instructions  Make sure you get all the shots (vaccines) that your doctor recommends. Ask your doctor about a flu shot and a pneumonia shot.  Use oxygen therapy and pulmonary rehabilitation if told by your doctor. If you need home oxygen therapy, ask your doctor if you should buy a tool to measure your oxygen level (oximeter).  Make a COPD action plan with your doctor. This helps you to know what to do if you feel worse than usual.  Manage any other conditions you have as told by your doctor.  Avoid going outside when it is very hot, cold, or humid.  Avoid people who have a sickness you can catch (contagious).  Keep all follow-up visits as told by your doctor. This is important. Contact a doctor if:  You cough up more mucus than usual.  There is a change in the color or thickness of the mucus.  It is harder to breathe than usual.  Your breathing is faster than usual.  You have trouble sleeping.  You need to use your medicines more often than usual.  You have trouble doing your normal activities such as getting dressed or walking around the house. Get help right away if:  You have shortness of breath while resting.  You have shortness of breath that stops you from: ? Being able to talk. ? Doing normal activities.  Your chest hurts for longer than 5 minutes.  Your skin color is more blue than usual.  Your pulse oximeter shows that you have low oxygen for longer than 5 minutes.  You have a fever.  You feel too tired to breathe normally. Summary  Chronic obstructive pulmonary disease (COPD) is a long-term lung problem.  The way your lungs work will never return to normal. Usually the condition gets worse over time. There are things you can do to keep yourself as healthy as possible.  Take over-the-counter and prescription medicines only as told by your doctor.  If you smoke, stop. Smoking makes the problem  worse. This information is not intended to replace advice given to you by your health care provider. Make sure you discuss any questions you have with your health care provider. Document Revised: 02/16/2017 Document Reviewed: 04/10/2016 Elsevier Patient Education  2020 [place] With Diabetes Diabetes (type 1 diabetes mellitus or type 2 diabetes mellitus) is a condition in which the body does not have enough of a hormone called insulin, or the  body does not respond properly to insulin. Normally, insulin allows sugars (glucose) to enter cells in the body. The cells use glucose for energy. With diabetes, extra glucose builds up in the blood instead of going into cells, which results in high blood glucose (hyperglycemia). How to manage lifestyle changes Managing diabetes includes medical treatments as well as lifestyle changes. If diabetes is not managed well, serious physical and emotional complications can occur. Taking good care of yourself means that you are responsible for:  Monitoring glucose regularly.  Eating a healthy diet.  Exercising regularly.  Meeting with health care providers.  Taking medicines as directed. Some people may feel a lot of stress about managing their diabetes. This is known as emotional distress, and it is very common. Living with diabetes can place you at risk for emotional distress, depression, or anxiety. These disorders can be confusing and can make diabetes management more difficult. How to recognize stress Emotional distress Symptoms of emotional distress include:  Anger about having a diagnosis of diabetes.  Fear or frustration about your diagnosis and the changes you need to make to manage the condition.  Being overly worried about the care that you need or the cost of the care that you need.  Feeling like you caused your condition by doing something wrong.  Fear of unpredictable situations, like low or high blood glucose.  Feeling  judged by your health care providers.  Feeling very alone with the disease.  Getting too tired or worn out with the demands of daily care. Depression Having diabetes means that you are at a higher risk for depression. Having depression also means that you are at a higher risk for diabetes. Your health care provider may test (screen) you for symptoms of depression. It is important to recognize depression symptoms and to start treatment for depression soon after it is diagnosed. The following are some symptoms of depression:  Loss of interest in things that you used to enjoy.  Trouble sleeping, or often waking up early and not being able to get back to sleep.  A change in appetite.  Feeling tired most of the day.  Feeling nervous and anxious.  Feeling guilty and worrying that you are a burden to others.  Feeling depressed more often than you do not feel that way.  Thoughts of hurting yourself or feeling that you want to die. If you have any of these symptoms for 2 weeks or longer, reach out to a health care provider. Follow these instructions at home: Managing emotional distress The following are some ways to manage emotional distress:  Talk with your health care provider or certified diabetes educator. Consider working with a counselor or therapist.  Learn as much as you can about diabetes and its treatment. Meet with a certified diabetes educator or take a class to learn how to manage your condition.  Keep a journal of your thoughts and concerns.  Accept that some things are out of your control.  Talk with other people who have diabetes. It can help to talk with others about the emotional distress that you feel.  Find ways to manage stress that work for you. These may include art or music therapy, exercise, meditation, and hobbies.  Seek support from spiritual leaders, family, and friends. General instructions  Follow your diabetes management plan.  Keep all follow-up  visits as told by your health care provider. This is important. Where to find support   Ask your health care provider to recommend a therapist  who understands both depression and diabetes.  Search for information and support from the American Diabetes Association: www.diabetes.org  Find a certified diabetes educator and make an appointment through [place] of Diabetes Educators: www.diabeteseducator.org Get help right away if:  You have thoughts about hurting yourself or others. If you ever feel like you may hurt yourself or others, or have thoughts about taking your own life, get help right away. You can go to your nearest emergency department or call:  Your local emergency services (911 in the [place]  A suicide crisis helpline, such as the [place] at 713-758-4108. This is open 24 hours a day. Summary  Diabetes (type 1 diabetes mellitus or type 2 diabetes mellitus) is a condition in which the body does not have enough of a hormone called insulin, or the body does not respond properly to insulin.  Living with diabetes puts you at risk for medical issues, and it also puts you at risk for emotional issues such as emotional distress, depression, and anxiety.  Recognizing the symptoms of emotional distress and depression may help you avoid problems with your diabetes control. It is important to start treatment for emotional distress and depression soon after they are diagnosed.  Having diabetes means that you are at a higher risk for depression. Ask your health care provider to recommend a therapist who understands both depression and diabetes.  If you experience symptoms of emotional distress or depression, it is important to discuss this with your health care provider, certified diabetes educator, or therapist. This information is not intended to replace advice given to you by your health care provider. Make sure you discuss any questions you have  with your health care provider. Document Revised: 03/18/2018 Document Reviewed: 07/20/2016 Elsevier Patient Education  2020 [place] With Heart Failure  Heart failure is a long-term (chronic) condition in which the heart cannot pump enough blood through the body. When this happens, parts of the body do not get the blood and oxygen they need. There is no cure for heart failure at this time, so it is important for you to take good care of yourself and follow the treatment plan set by your health care provider. If you are living with heart failure, there are ways to help you manage the disease. Follow these instructions at home: Living with heart failure requires you to make changes in your life. Your health care team will teach you about the changes you need to make in order to relieve your symptoms and lower your risk of going to the hospital. Follow the treatment plan as set by your health care provider. Medicines Medicines are important in reducing your heart's workload, slowing the progression of heart failure, and improving your symptoms.  Take over-the-counter and prescription medicines only as told by your health care provider.  Do not stop taking your medicine unless your health care provider tells you to do that.  Do not skip any dose of your medicine.  Refill prescriptions before you run out of medicine. You need your medicines every day. Eating and drinking   Eat heart-healthy foods. Talk with a dietitian to make an eating plan that is right for you. ? If directed by your health care provider:  Limit salt (sodium). Lowering your sodium intake may reduce symptoms of heart failure. Ask a dietitian to recommend heart-healthy seasonings.  Limit your fluid intake. Fluid restriction may reduce symptoms of heart failure. ? Use low-fat cooking methods instead of frying.  Low-fat methods include roasting, grilling, broiling, baking, poaching, steaming, and  stir-frying. ? Choose foods that contain no trans fat and are low in saturated fat and cholesterol. Healthy choices include fresh or frozen fruits and vegetables, fish, lean meats, legumes, fat-free or low-fat dairy products, and whole-grain or high-fiber foods.  Limit alcohol intake to no more than 1 drink a day for nonpregnant women and 2 drinks a day for men. One drink equals 12 oz of beer, 5 oz of wine, or 1 oz of hard liquor. ? Drinking more than that is harmful to your heart. Tell your health care provider if you drink alcohol several times a week. ? Talk with your health care provider about whether any level of alcohol use is safe for you. Activity   Ask your health care provider about attending cardiac rehabilitation. These programs include aerobic physical activity, which provides many benefits for your heart.  If no cardiac rehabilitation program is available, ask your health care provider what aerobic exercises are safe for you to do. Lifestyle Make the lifestyle changes recommended by your health care provider. In general:  Lose weight if your health care provider tells you to do that. Weight loss may reduce symptoms of heart failure.  Do not use any products that contain nicotine or tobacco, such as cigarettes or e-cigarettes. If you need help quitting, ask your health care provider.  Do not use street (illegal) drugs.  Return to your normal activities as told by your health care provider. Ask your health care provider what activities are safe for you. General instructions   Make sure you weigh yourself every day to track your weight. Rapid weight gain may indicate an increase in fluid in your body and may increase the workload of your heart. ? Weigh yourself every morning. Do this after you urinate but before you eat breakfast. ? Wear the same type of clothing, without shoes, each time you weigh yourself. ? Weigh yourself on the same scale and in the same spot each  time.  Living with chronic heart failure often leads to emotions such as fear, stress, anxiety, and depression. If you feel any of these emotions and need help coping, contact your health care provider. Other ways to get help include: ? Talking to friends and family members about your condition. They can give you support and guidance. Explain your symptoms to them and, if comfortable, invite them to attend appointments or rehabilitation with you. ? Joining a support group for people with chronic heart failure. Talking with other people who have the same symptoms may give you new ways of coping with your disease and your emotions.  Stay up to date with your shots (vaccines). Staying current on pneumococcal and influenza vaccines is especially important in preventing germs from attacking your airways (respiratory infections).  Keep all follow-up visits as told by your health care provider. This is important. How to recognize changes in your condition You and your family members need to know what changes to watch for in your condition. Watch for the following changes and report them to your health care provider:  Sudden weight gain. Ask your health care provider what amount of weight gain to report.  Shortness of breath: ? Feeling short of breath while at rest, with no exercise or activity that required great effort. ? Feeling breathless with activity.  Swelling of your lower legs or ankles.  Difficulty sleeping: ? You wake up feeling short of breath. ? You have to use more  pillows to raise your head in order to sleep.  Frequent, dry, hacking cough.  Loss of appetite.  Feeling more tired all the time.  Depression or feelings of sadness or hopelessness.  Bloating in the stomach. Where to find more information  Local support groups. Ask your health care provider about groups near you.  The American Heart Association: www.heart.org Contact a health care provider if:  You have a  rapid weight gain.  You have increasing shortness of breath that is unusual for you.  You are unable to participate in your usual physical activities.  You tire easily.  You cough more than normal, especially with physical activity.  You have any swelling or more swelling in areas such as your hands, feet, ankles, or abdomen.  You feel like your heart is beating quickly (palpitations).  You become dizzy or light-headed when you stand up. Get help right away if:  You have difficulty breathing.  You notice or your family notices a change in your awareness, such as having trouble staying awake or having difficulty with concentration.  You have pain or discomfort in your chest.  You have an episode of fainting (syncope). Summary  There is no cure for heart failure, so it is important for you to take good care of yourself and follow the treatment plan set by your health care provider.  Medicines are important in reducing your heart's workload, slowing the progression of heart failure, and improving your symptoms.  Living with chronic heart failure often leads to emotions such as fear, stress, anxiety, and depression. If you are feeling any of these emotions and need help coping, contact your health care provider. This information is not intended to replace advice given to you by your health care provider. Make sure you discuss any questions you have with your health care provider. Document Revised: 02/16/2017 Document Reviewed: 07/19/2016 Elsevier Patient Education  2020 [employer].

## 2019-04-27 ENCOUNTER — Encounter: Payer: Self-pay | Admitting: Family Medicine

## 2019-04-27 NOTE — Progress Notes (Signed)
Patient presents to clinic today to f/u on chronic conditions and establish care.  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  Pt previously seen at [facility].    H/o neurologic condition: -pt endorses HA/"brain freeze" if laughs too hard -states it is a type of seizure -seen by Neurology in [place], [place] -taking "a pill 3x/day" cannot recall name  COPD/asthma: -last exacerbation was in Dec when pt was hospitalized with COVID-19. -using albuterol inhaler prn, anoro ellipta daily  Chronic diastolic CHF/HTN: -pt denies haing a Cardiologist -taking Cartia XT 240 mg, HCT 12.5 mg daily, clonidine 0.1 mg BID, and norvasc 5 mg -drinking 4-5 bottles of water per day -notes DOE and dizziness x 3 wks. -denies cough, but noted cough after wearing CPAP.  Stopped using CPAP x 2 nights.    H/o b/l DVT: -positive lupus anticoagulant -on coumadin 5 mg S, T, Th, Sa -last INR 2.5 on 03/16/19  Adrenal mass, R: -per chart review noted in early 2000s.  Approx 2 cm.  Likely an adenoma -CT abd/pelvis 09/01/16 mass increased to 3 cm -pt states she needs to schedule an appt with her Nephrologist  Chronic back and R knee pain: -notes seen by Ortho , Dr. Larose Hires -h/o leg weakness 2/2 bulging disc -had spinal fusion in the past. -endorses MRI on 04/16/19 -notes low back pain with b/l sciatica -in the past, steroid injection in back caused rash  Social hx: Pt's daughter Druscilla Brownie, seen by this provider.  Past Medical History:  Diagnosis Date  . Adrenal mass ([place]) 03/226   Benign  . Arthritis    knees/multiple orthopedic conditons; lower back  . Asthma    per pt  . 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 COPD (chronic obstructive pulmonary disease) (HCC)    inhalers  dependent on environment  . Depression   . Diabetes mellitus    120s usually fasting -  dx more than 10 yrs ago  . Dizziness, nonspecific   . DVT (deep venous thrombosis) (HCC)    Recurrent  . Fibromyalgia   . GERD (gastroesophageal reflux disease)   . Heart murmur   . History of blood transfusion   . History of cocaine abuse ([place])    Remote history   . Hypertension    takes meds daily  . Hypothyroidism   . Lupus Anticoagulant Positive   . On home oxygen therapy    at night  . Shortness of breath    exertion or lying flat  . Sleep apnea    2l of oxygen at night (as of 12/6, she used to)    Past Surgical History:  Procedure Laterality Date  . ABDOMINAL HYSTERECTOMY  1989   Fibroids  . ANTERIOR LUMBAR FUSION  01/03/2012   Procedure: ANTERIOR LUMBAR FUSION 1 LEVEL;  Surgeon: Winfield Cunas, MD;  Location: [facility];  Service: Neurosurgery;  Laterality: N/A;  Lumbar Four-Five Anterior Lumbar Interbody Fusion with Instrumentation  . BACK SURGERY     cervical spine---disk disease  . CARPAL TUNNEL RELEASE     Bilateral  . CESAREAN SECTION    . CHOLECYSTECTOMY    . CYSTOSCOPY/RETROGRADE/URETEROSCOPY/STONE EXTRACTION WITH BASKET Right 04/26/2014   Procedure: CYSTOSCOPY/ RIGHT RETROGRADE/ RIGHT URETEROSCOPY/URETERAL AND RENAL PELVIS BIOPSY;  Surgeon: Hubbard Robinson  Tresa Moore, MD;  Location: [facility];  Service: Urology;  Laterality: Right;  . gallstones removed    . 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   bilateral  . right elbow surgery    . THYROID LOBECTOMY Right 02/27/2017  . THYROID LOBECTOMY Right 02/27/2017   Procedure: RIGHT THYROID LOBECTOMY;  Surgeon: Erroll [place], MD;  Location: [facility];  Service: General;  Laterality: Right;  . TUBAL LIGATION      Current Outpatient Medications on File Prior to Visit  Medication Sig Dispense Refill  . albuterol (PROVENTIL HFA;VENTOLIN HFA) 108 (90 BASE) MCG/ACT inhaler Inhale 2 puffs into the lungs every 4 (four) hours as needed for shortness of  breath.    Marland Kitchen albuterol (PROVENTIL) (2.5 MG/3ML) 0.083% nebulizer solution Take 2.5 mg by nebulization every 6 (six) hours as needed for wheezing or shortness of breath.    Marland Kitchen amLODipine (NORVASC) 5 MG tablet Take 5 mg by mouth daily.    Jearl Klinefelter ELLIPTA 62.5-25 MCG/INH AEPB Inhale 1 puff into the lungs daily.     Marland Kitchen aspirin EC 81 MG EC tablet Take 1 tablet (81 mg total) by mouth daily. 30 tablet 0  . canagliflozin (INVOKANA) 100 MG TABS tablet Take 100 mg by mouth daily.    Marland Kitchen CARTIA XT 240 MG 24 hr capsule Take 240 mg by mouth daily.  3  . cloNIDine (CATAPRES) 0.1 MG tablet Take 0.1 mg by mouth 2 (two) times daily.    . diclofenac sodium (VOLTAREN) 1 % GEL Apply 2 g topically daily as needed (fibromyalgia pain).    . DULoxetine (CYMBALTA) 20 MG capsule Take 20 mg by mouth daily.  2  . DULoxetine (CYMBALTA) 60 MG capsule Take 60 mg by mouth at bedtime.     . fluticasone (FLONASE) 50 MCG/ACT nasal spray Place 1 spray into both nostrils daily.     Marland Kitchen gabapentin (NEURONTIN) 300 MG capsule Take 300 mg by mouth at bedtime.     . hydrochlorothiazide (HYDRODIURIL) 12.5 MG tablet Take 12.5 mg by mouth daily.   2  . levocetirizine (XYZAL) 5 MG tablet Take 5 mg by mouth at bedtime.    Marland Kitchen LINZESS 145 MCG CAPS capsule Take 145 mcg by mouth daily before breakfast.   2  . meclizine (ANTIVERT) 25 MG tablet Take 1 tablet (25 mg total) by mouth 3 (three) times daily as needed for dizziness. 30 tablet 0  . metFORMIN (GLUCOPHAGE) 500 MG tablet Take 500 mg by mouth at bedtime.    . metoCLOPramide (REGLAN) 10 MG tablet Take 1 tablet (10 mg total) by mouth every 6 (six) hours as needed for nausea (or headache). 30 tablet 0  . omeprazole (PRILOSEC) 20 MG capsule Take 20 mg by mouth 2 (two) times daily.    . pravastatin (PRAVACHOL) 40 MG tablet Take 40 mg by mouth every evening.     Marland Kitchen QUEtiapine (SEROQUEL) 50 MG tablet Take 50 mg by mouth at bedtime.     Marland Kitchen REXULTI 2 MG TABS tablet Take 2 mg by mouth daily.    Marland Kitchen tiZANidine  (ZANAFLEX) 4 MG tablet Take 4 mg by mouth 2 (two) times daily as needed for muscle spasms.  2  . warfarin (COUMADIN) 5 MG tablet TAKE 1 TABLET BY MOUTH DAILY EXCEPT ON MONDAY, WEDNESDAY, AND FRIDAY OR AS DIRECTED. 50 tablet 1  . zonisamide (ZONEGRAN) 50 MG capsule Take 50 mg by mouth 2 (two) times a day.     No current facility-administered medications on file  prior to visit.    Allergies  Allergen Reactions  . Lisinopril Swelling    THROAT SWELLING Pt states her throat started to "close up" after being on it for "so long."  . Corticosteroids Rash    States she developed a rash on her tongue after a steroid injection in her bac    Family History  Problem Relation Age of Onset  . Hypertension Mother   . Diabetes Mother   . Cancer Mother        Pancreatic  . Hypertension Father   . Heart failure Father     Social History   Socioeconomic History  . Marital status: Divorced    Spouse name: Not on file  . Number of children: Not on file  . Years of education: Not on file  . Highest education level: Not on file  Occupational History  . Not on file  Tobacco Use  . Smoking status: Former Smoker    Start date: 03/20/2001    Quit date: 03/21/2003    Years since quitting: 16.1  . Smokeless tobacco: Never Used  . Tobacco comment: smoked for 2 years  Substance and Sexual Activity  . Alcohol use: No    Alcohol/week: 0.0 standard drinks    Comment: beer in past   . Drug use: Yes    Types: Cocaine    Comment: quit 2003-rehab progam in 2005  . Sexual activity: Never  Other Topics Concern  . Not on file  Social History Narrative   Lives alone in apartment, drives   Social Determinants of Health   Financial Resource Strain:   . Difficulty of Paying Living Expenses: Not on file  Food Insecurity:   . Worried About Charity fundraiser in the Last Year: Not on file  . Ran Out of Food in the Last Year: Not on file  Transportation Needs:   . Lack of Transportation (Medical): Not on  file  . Lack of Transportation (Non-Medical): Not on file  Physical Activity:   . Days of Exercise per Week: Not on file  . Minutes of Exercise per Session: Not on file  Stress:   . Feeling of Stress : Not on file  Social Connections:   . Frequency of Communication with Friends and Family: Not on file  . Frequency of Social Gatherings with Friends and Family: Not on file  . Attends Religious Services: Not on file  . Active Member of Clubs or Organizations: Not on file  . Attends Archivist Meetings: Not on file  . Marital Status: Not on file  Intimate Partner Violence:   . Fear of Current or Ex-Partner: Not on file  . Emotionally Abused: Not on file  . Physically Abused: Not on file  . Sexually Abused: Not on file    ROS General: Denies fever, chills, night sweats, changes in weight, changes in appetite  +dizziness HEENT: Denies headaches, ear pain, changes in vision, rhinorrhea, sore throat CV: Denies CP, palpitations, SOB, orthopnea  +DOE Pulm: Denies SOB, cough, wheezing  +DOE GI: Denies abdominal pain, nausea, vomiting, diarrhea, constipation GU: Denies dysuria, hematuria, frequency, vaginal discharge Msk: Denies muscle cramps, joint pains +low back pain, R knee pain Neuro: Denies weakness, numbness, tingling Skin: Denies rashes, bruising Psych: Denies depression, anxiety, hallucinations   BP 108/82 (BP Location: Left Arm, Patient Position: Sitting, Cuff Size: Large)   Pulse 86   Temp (!) 96.9 F (36.1 C) (Temporal)   Ht _0  (1.676 m)  Wt 273 lb (123.8 kg)   SpO2 97%   BMI 44.06 kg/m   Physical Exam Gen. Pleasant, well developed, well-nourished, in NAD HEENT - [place]/AT, PERRL, EOMI, no scleral icterus, no nasal drainage, pharynx without erythema or exudate. TMs normal b/l. Neck: No JVD, no thyromegaly, no carotid bruits Lungs: no use of accessory muscles, CTAB, no wheezes, rales or rhonchi Cardiovascular: RRR, No r/g/m, no peripheral edema Abdomen: BS  present, soft, nontender,nondistended Musculoskeletal: No deformities, moves all four extremities, no cyanosis or clubbing, normal tone Neuro:  A&Ox3, CN II-XII intact, normal gait Skin:  Warm, dry, intact, no lesions   Recent Results (from the past 2160 hour(s))  Urinalysis, Routine w reflex microscopic     Status: Abnormal   Collection Time: 02/19/19  4:42 PM  Result Value Ref Range   Color, Urine YELLOW YELLOW   APPearance CLEAR CLEAR   Specific Gravity, Urine 1.021 1.005 - 1.030   pH 5.0 5.0 - 8.0   Glucose, UA >=500 (A) NEGATIVE mg/dL   Hgb urine dipstick NEGATIVE NEGATIVE   Bilirubin Urine NEGATIVE NEGATIVE   Ketones, ur NEGATIVE NEGATIVE mg/dL   Protein, ur NEGATIVE NEGATIVE mg/dL   Nitrite NEGATIVE NEGATIVE   Leukocytes,Ua NEGATIVE NEGATIVE   RBC / HPF 0-5 0 - 5 RBC/hpf   WBC, UA 0-5 0 - 5 WBC/hpf   Bacteria, UA RARE (A) NONE SEEN   Squamous Epithelial / LPF 0-5 0 - 5   Mucus PRESENT     Comment: Performed at [facility], [place] [address] [place], [place] [address]  CBC with Differential     Status: Abnormal   Collection Time: 02/19/19  5:20 PM  Result Value Ref Range   WBC 7.6 4.0 - 10.5 K/uL   RBC 4.48 3.87 - 5.11 MIL/uL   Hemoglobin 11.9 (L) 12.0 - 15.0 g/dL   HCT 38.6 36.0 - 46.0 %   MCV 86.2 80.0 - 100.0 fL   MCH 26.6 26.0 - 34.0 pg   MCHC 30.8 30.0 - 36.0 g/dL   RDW 19.4 (H) 11.5 - 15.5 %   Platelets 357 150 - 400 K/uL   nRBC 0.0 0.0 - 0.2 %   Neutrophils Relative % 62 %   Neutro Abs 4.6 1.7 - 7.7 K/uL   Lymphocytes Relative 28 %   Lymphs Abs 2.1 0.7 - 4.0 K/uL   Monocytes Relative 9 %   Monocytes Absolute 0.7 0.1 - 1.0 K/uL   Eosinophils Relative 1 %   Eosinophils Absolute 0.1 0.0 - 0.5 K/uL   Basophils Relative 0 %   Basophils Absolute 0.0 0.0 - 0.1 K/uL   Immature Granulocytes 0 %   Abs Immature Granulocytes 0.02 0.00 - 0.07 K/uL    Comment: Performed at [facility], [place] [address] [place],  [address]   Comprehensive metabolic panel     Status: Abnormal   Collection Time: 02/19/19  5:20 PM  Result Value Ref Range   Sodium 143 135 - 145 mmol/L   Potassium 3.5 3.5 - 5.1 mmol/L   Chloride 107 98 - 111 mmol/L   CO2 25 22 - 32 mmol/L   Glucose, Bld 175 (H) 70 - 99 mg/dL   BUN 14 8 - 23 mg/dL   Creatinine, Ser 0.91 0.44 - 1.00 mg/dL   Calcium 8.9 8.9 - 10.3 mg/dL   Total Protein 8.5 (H) 6.5 - 8.1 g/dL   Albumin 4.0 3.5 - 5.0 g/dL   AST 26 15 - 41  U/L   ALT 26 0 - 44 U/L   Alkaline Phosphatase 87 38 - 126 U/L   Total Bilirubin 0.2 (L) 0.3 - 1.2 mg/dL   GFR calc non Af Amer >60 >60 mL/min   GFR calc Af Amer >60 >60 mL/min   Anion gap 11 5 - 15    Comment: Performed at [facility], [place] [address] [place], [place] [address]  Comprehensive metabolic panel     Status: Abnormal   Collection Time: 02/20/19  4:55 AM  Result Value Ref Range   Sodium 140 135 - 145 mmol/L   Potassium 3.1 (L) 3.5 - 5.1 mmol/L   Chloride 109 98 - 111 mmol/L   CO2 21 (L) 22 - 32 mmol/L   Glucose, Bld 145 (H) 70 - 99 mg/dL   BUN 11 8 - 23 mg/dL   Creatinine, Ser 0.75 0.44 - 1.00 mg/dL   Calcium 7.6 (L) 8.9 - 10.3 mg/dL   Total Protein 7.5 6.5 - 8.1 g/dL   Albumin 3.6 3.5 - 5.0 g/dL   AST 26 15 - 41 U/L   ALT 26 0 - 44 U/L   Alkaline Phosphatase 73 38 - 126 U/L   Total Bilirubin 0.7 0.3 - 1.2 mg/dL   GFR calc non Af Amer >60 >60 mL/min   GFR calc Af Amer >60 >60 mL/min   Anion gap 10 5 - 15    Comment: Performed at [facility], [place] [address], [place], [place] [address]  CBC     Status: Abnormal   Collection Time: 02/20/19  4:55 AM  Result Value Ref Range   WBC 6.0 4.0 - 10.5 K/uL   RBC 4.52 3.87 - 5.11 MIL/uL   Hemoglobin 12.0 12.0 - 15.0 g/dL   HCT 38.8 36.0 - 46.0 %   MCV 85.8 80.0 - 100.0 fL   MCH 26.5 26.0 - 34.0 pg   MCHC 30.9 30.0 - 36.0 g/dL   RDW 19.6 (H) 11.5 - 15.5 %   Platelets 307 150 - 400 K/uL   nRBC 0.0 0.0 - 0.2 %    Comment: Performed at  [facility], [place] [address] [place], [place] [address]  Protime-INR     Status: None   Collection Time: 02/20/19  5:30 AM  Result Value Ref Range   Prothrombin Time 13.5 11.4 - 15.2 seconds   INR 1.0 0.8 - 1.2    Comment: (NOTE) INR [place] varies based on device and disease states. Performed at [facility], [place] [address], [place], [place] [address]   ECHOCARDIOGRAM COMPLETE     Status: None   Collection Time: 02/20/19 12:06 PM  Result Value Ref Range   BP 174/105 mmHg  Protime-INR     Status: Abnormal   Collection Time: 02/26/19  7:59 AM  Result Value Ref Range   Prothrombin Time 35.2 (H) 11.4 - 15.2 seconds   INR 3.5 (H) 0.8 - 1.2    Comment: (NOTE) INR [place] varies based on device and disease states. Performed at [facility], [place] [address], [place], [place] [address]   POC SARS Coronavirus 2 Ag-ED - Nasal Swab (BD Veritor Kit)     Status: None   Collection Time: 03/09/19  6:49 PM  Result Value Ref Range   SARS Coronavirus 2 Ag NEGATIVE NEGATIVE    Comment: (NOTE) SARS-CoV-2 antigen NOT DETECTED.  Negative results are presumptive.  Negative results do not preclude SARS-CoV-2 infection and  should not be used as the sole basis for treatment or other patient management decisions, including infection  control decisions, particularly in the presence of clinical signs and  symptoms consistent with COVID-19, or in those who have been in contact with the virus.  Negative results must be combined with clinical observations, patient history, and epidemiological information. The expected result is Negative. Fact Sheet for Patients: PodPark.tn Fact Sheet for Healthcare Providers: GiftContent.is This test is not yet approved or cleared by the [place] FDA and  has been authorized for detection and/or diagnosis of SARS-CoV-2 by FDA under an Emergency Use  Authorization (EUA).  This EUA will remain in effect (meaning this test can be used) for the duration of  the COVID-19 de claration under Section 564(b)(1) of the Act, 21 U.S.C. section 360bbb-3(b)(1), unless the authorization is terminated or revoked sooner.   CBC     Status: Abnormal   Collection Time: 03/09/19  7:01 PM  Result Value Ref Range   WBC 5.9 4.0 - 10.5 K/uL   RBC 7.47 (H) 3.87 - 5.11 MIL/uL   Hemoglobin 19.8 (H) 12.0 - 15.0 g/dL   HCT 60.3 (H) 36.0 - 46.0 %   MCV 80.7 80.0 - 100.0 fL   MCH 26.5 26.0 - 34.0 pg   MCHC 32.8 30.0 - 36.0 g/dL   RDW 19.2 (H) 11.5 - 15.5 %   Platelets 227 150 - 400 K/uL   nRBC 1.4 (H) 0.0 - 0.2 %    Comment: Performed at [facility] [address] [place], [place] [address]  Comprehensive metabolic panel     Status: Abnormal   Collection Time: 03/09/19  7:01 PM  Result Value Ref Range   Sodium 139 135 - 145 mmol/L   Potassium 2.7 (LL) 3.5 - 5.1 mmol/L    Comment: CRITICAL RESULT CALLED TO, READ BACK BY AND VERIFIED WITH: RN E WILSON AT 2007 03/09/2019 BY L BENFIELD    Chloride 99 98 - 111 mmol/L   CO2 20 (L) 22 - 32 mmol/L   Glucose, Bld 164 (H) 70 - 99 mg/dL   BUN 27 (H) 8 - 23 mg/dL   Creatinine, Ser 1.40 (H) 0.44 - 1.00 mg/dL   Calcium 6.9 (L) 8.9 - 10.3 mg/dL   Total Protein 8.7 (H) 6.5 - 8.1 g/dL   Albumin 3.0 (L) 3.5 - 5.0 g/dL   AST 128 (H) 15 - 41 U/L   ALT 81 (H) 0 - 44 U/L   Alkaline Phosphatase 84 38 - 126 U/L   Total Bilirubin 1.3 (H) 0.3 - 1.2 mg/dL   GFR calc non Af Amer 40 (L) >60 mL/min   GFR calc Af Amer 46 (L) >60 mL/min   Anion gap 20 (H) 5 - 15    Comment: Performed at [facility], [place] [address], [place], [place] [address]  Blood culture (routine x 2)     Status: None   Collection Time: 03/09/19  7:01 PM   Specimen: BLOOD LEFT ARM  Result Value Ref Range   Specimen Description BLOOD LEFT ARM UPPER    Special Requests      BOTTLES DRAWN AEROBIC AND ANAEROBIC Blood Culture results may not be  optimal due to an inadequate volume of blood received in culture bottles   Culture      NO GROWTH 5 DAYS Performed at [facility], [place] [address], [place], [place] [address]    Report Status 03/14/2019 FINAL  Brain natriuretic peptide     Status: None   Collection Time: 03/09/19  7:01 PM  Result Value Ref Range   B Natriuretic Peptide 40.8 0.0 - 100.0 pg/mL    Comment: Performed at [facility] [address] [place], [place] [address]  Lactate     Status: None   Collection Time: 03/09/19  7:02 PM  Result Value Ref Range   Lactic Acid, Venous 1.4 0.5 - 1.9 mmol/L    Comment: Performed at [facility] [address] [place], [place] [address]  Urinalysis, Routine w reflex microscopic     Status: Abnormal   Collection Time: 03/09/19  7:18 PM  Result Value Ref Range   Color, Urine AMBER (A) YELLOW    Comment: BIOCHEMICALS MAY BE AFFECTED BY COLOR   APPearance HAZY (A) CLEAR   Specific Gravity, Urine 1.021 1.005 - 1.030   pH 5.0 5.0 - 8.0   Glucose, UA NEGATIVE NEGATIVE mg/dL   Hgb urine dipstick MODERATE (A) NEGATIVE   Bilirubin Urine NEGATIVE NEGATIVE   Ketones, ur 20 (A) NEGATIVE mg/dL   Protein, ur 100 (A) NEGATIVE mg/dL   Nitrite NEGATIVE NEGATIVE   Leukocytes,Ua NEGATIVE NEGATIVE   RBC / HPF 0-5 0 - 5 RBC/hpf   WBC, UA 0-5 0 - 5 WBC/hpf   Bacteria, UA NONE SEEN NONE SEEN   Squamous Epithelial / LPF 0-5 0 - 5   Mucus PRESENT    Granular Casts, UA PRESENT     Comment: Performed at [facility], [place] [address] [place], [place] [address]  Respiratory Panel by RT PCR (Flu A&B, Covid) - Nasopharyngeal Swab     Status: Abnormal   Collection Time: 03/09/19  7:51 PM   Specimen: Nasopharyngeal Swab  Result Value Ref Range   SARS Coronavirus 2 by RT PCR POSITIVE (A) NEGATIVE    Comment: RESULT CALLED TO, READ BACK BY AND VERIFIED WITH: RN T [facility] _0  03/10/19 BY S GEZAHEGN (NOTE) SARS-CoV-2 target nucleic acids are DETECTED. SARS-CoV-2 RNA is  generally detectable in upper respiratory specimens  during the acute phase of infection. Positive results are indicative of the presence of the identified virus, but do not rule out bacterial infection or co-infection with other pathogens not detected by the test. Clinical correlation with patient history and other diagnostic information is necessary to determine patient infection status. The expected result is Negative. Fact Sheet for Patients:  PinkCheek.be Fact Sheet for Healthcare Providers: GravelBags.it This test is not yet approved or cleared by the [place] FDA and  has been authorized for detection and/or diagnosis of SARS-CoV-2 by FDA under an Emergency Use Authorization (EUA).  This EUA will remain in effect (meaning this test can be [place] ed) for the duration of  the COVID-19 declaration under Section 564(b)(1) of the Act, 21 U.S.C. section 360bbb-3(b)(1), unless the authorization is terminated or revoked sooner.    Influenza A by PCR NEGATIVE NEGATIVE   Influenza B by PCR NEGATIVE NEGATIVE    Comment: (NOTE) The Xpert Xpress SARS-CoV-2/FLU/RSV assay is intended as an aid in  the diagnosis of influenza from Nasopharyngeal swab specimens and  should not be used as a sole basis for treatment. Nasal washings and  aspirates are unacceptable for Xpert Xpress SARS-CoV-2/FLU/RSV  testing. Fact Sheet for Patients: PinkCheek.be Fact Sheet for Healthcare Providers: GravelBags.it This test is not yet approved or cleared by the [place] FDA and  has been authorized for detection and/or diagnosis of  SARS-CoV-2 by  FDA under an Emergency Use Authorization (EUA). This EUA will remain  in effect (meaning this test can be used) for the duration of the  Covid-19 declaration under Section 564(b)(1) of the Act, 21  U.S.C. section 360bbb-3(b)(1), unless the  authorization is  terminated or revoked. Performed at [facility], [place] [address], [place], [place] [address]   I-STAT 7, (LYTES, BLD GAS, ICA, H+H)     Status: Abnormal   Collection Time: 03/09/19  8:11 PM  Result Value Ref Range   pH, Arterial 7.492 (H) 7.350 - 7.450   pCO2 arterial 26.3 (L) 32.0 - 48.0 mmHg   pO2, Arterial 64.0 (L) 83.0 - 108.0 mmHg   Bicarbonate 20.1 20.0 - 28.0 mmol/L   TCO2 21 (L) 22 - 32 mmol/L   O2 Saturation 94.0 %   Acid-base deficit 2.0 0.0 - 2.0 mmol/L   Sodium 138 135 - 145 mmol/L   Potassium 2.6 (LL) 3.5 - 5.1 mmol/L   Calcium, Ion 0.90 (L) 1.15 - 1.40 mmol/L   HCT 36.0 36.0 - 46.0 %   Hemoglobin 12.2 12.0 - 15.0 g/dL   Patient temperature 98.6 F    Collection site RADIAL, ALLEN'S TEST ACCEPTABLE    Drawn by RT    Sample type ARTERIAL    Comment NOTIFIED PHYSICIAN   Protime-INR     Status: Abnormal   Collection Time: 03/09/19  8:12 PM  Result Value Ref Range   Prothrombin Time 20.4 (H) 11.4 - 15.2 seconds   INR 1.8 (H) 0.8 - 1.2    Comment: (NOTE) INR [place] varies based on device and disease states. Performed at [facility], [place] [address], [place], [place] [address]   HIV Antibody (routine testing w rflx)     Status: None   Collection Time: 03/09/19 11:42 PM  Result Value Ref Range   HIV Screen 4th Generation wRfx NON REACTIVE NON REACTIVE    Comment: Performed at [facility] [address], [place], [place] [address]  Ferritin     Status: None   Collection Time: 03/09/19 11:42 PM  Result Value Ref Range   Ferritin 230 11 - 307 ng/mL    Comment: Performed at [facility], [place] [address] [place], [place] [address]  Fibrinogen     Status: Abnormal   Collection Time: 03/09/19 11:42 PM  Result Value Ref Range   Fibrinogen >800 (H) 210 - 475 mg/dL    Comment: REPEATED TO VERIFY Performed at [facility] [address], [place], [place] [address]   D-dimer, quantitative (not at [facility])     Status: Abnormal    Collection Time: 03/09/19 11:42 PM  Result Value Ref Range   D-Dimer, Quant 1.31 (H) 0.00 - 0.50 ug/mL-FEU    Comment: (NOTE) At the manufacturer cut-off of 0.50 ug/mL FEU, this assay has been documented to exclude PE with a sensitivity and negative predictive value of 97 to 99%.  At this time, this assay has not been approved by the FDA to exclude DVT/VTE. Results should be correlated with clinical presentation. Performed at [facility], [place] [address] [place], [place] [address] CORRECTED ON 12/21 AT 0033: PREVIOUSLY REPORTED AS 1.31   Procalcitonin     Status: None   Collection Time: 03/09/19 11:42 PM  Result Value Ref Range   Procalcitonin 0.15 ng/mL    Comment:        Interpretation: PCT (Procalcitonin) <= 0.5 ng/mL:  Systemic infection (sepsis) is not likely. Local bacterial infection is possible. (NOTE)       Sepsis PCT Algorithm           Lower Respiratory Tract                                      Infection PCT Algorithm    ----------------------------     ----------------------------         PCT < 0.25 ng/mL                PCT < 0.10 ng/mL         Strongly encourage             Strongly discourage   discontinuation of antibiotics    initiation of antibiotics    ----------------------------     -----------------------------       PCT 0.25 - 0.50 ng/mL            PCT 0.10 - 0.25 ng/mL               OR       >80% decrease in PCT            Discourage initiation of                                            antibiotics      Encourage discontinuation           of antibiotics    ----------------------------     -----------------------------         PCT >= 0.50 ng/mL              PCT 0.26 - 0.50 ng/mL               AND        <80% decrease in PCT             Encourage initiation of                                             antibiotics       Encourage continuation           of antibiotics    ----------------------------     -----------------------------         PCT >= 0.50 ng/mL                  PCT > 0.50 ng/mL               AND         increase in PCT                  Strongly encourage                                      initiation of antibiotics    Strongly encourage escalation           of antibiotics                                     -----------------------------  PCT <= 0.25 ng/mL                                                 OR                                        > 80% decrease in PCT                                     Discontinue / Do not initiate                                             antibiotics Performed at [facility], [place] [address] [place], [place] [address]   Lactate dehydrogenase     Status: Abnormal   Collection Time: 03/09/19 11:42 PM  Result Value Ref Range   LDH 702 (H) 98 - 192 U/L    Comment: Performed at [facility], [place] [address], [place], [place] [address]  C-reactive protein     Status: Abnormal   Collection Time: 03/09/19 11:42 PM  Result Value Ref Range   CRP 30.4 (H) <1.0 mg/dL    Comment: Performed at [facility] [facility] [address] [place], [place] [address]  Magnesium     Status: Abnormal   Collection Time: 03/09/19 11:42 PM  Result Value Ref Range   Magnesium 1.4 (L) 1.7 - 2.4 mg/dL    Comment: Performed at [place] [address] [place], [place] [address]  Troponin I (High Sensitivity)     Status: Abnormal   Collection Time: 03/09/19 11:42 PM  Result Value Ref Range   Troponin I (High Sensitivity) 41 (H) <18 ng/L    Comment: (NOTE) Elevated high sensitivity troponin I (hsTnI) values and significant  changes across serial measurements may suggest ACS but many other  chronic and acute conditions are known to elevate hsTnI results.  Refer to the "Links" section for chest pain algorithms and additional  guidance. Performed at [facility], [place] [address], [place], [place] [address]   Protime-INR     Status:  Abnormal   Collection Time: 03/09/19 11:42 PM  Result Value Ref Range   Prothrombin Time 21.3 (H) 11.4 - 15.2 seconds   INR 1.9 (H) 0.8 - 1.2    Comment: (NOTE) INR [place] varies based on device and disease states. Performed at [facility], [place] [address] [place], [place] [address]   Beta-hydroxybutyric acid     Status: Abnormal   Collection Time: 03/09/19 11:43 PM  Result Value Ref Range   Beta-Hydroxybutyric Acid 3.11 (H) 0.05 - 0.27 mmol/L    Comment: Performed at [facility] [address] [place],  [address]  Hemoglobin A1c     Status: Abnormal   Collection Time: 03/09/19 11:44 PM  Result Value Ref Range   Hgb A1c MFr Bld 8.5 (H) 4.8 - 5.6 %    Comment: (NOTE) Pre diabetes:          5.7%-6.4% Diabetes:              >  6.4% Glycemic control for   <7.0% adults with diabetes    Mean Plasma Glucose 197.25 mg/dL    Comment: Performed at [facility], [place] [address] [place], [place] [address]  CBG monitoring, ED     Status: Abnormal   Collection Time: 03/09/19 11:49 PM  Result Value Ref Range   Glucose-Capillary 136 (H) 70 - 99 mg/dL  Troponin I (High Sensitivity)     Status: Abnormal   Collection Time: 03/10/19  2:42 AM  Result Value Ref Range   Troponin I (High Sensitivity) 40 (H) <18 ng/L    Comment: Performed at [facility] [address] [place], [place] [address]  CBC with Differential/Platelet     Status: Abnormal   Collection Time: 03/10/19  6:18 AM  Result Value Ref Range   WBC 11.2 (H) 4.0 - 10.5 K/uL   RBC 4.55 3.87 - 5.11 MIL/uL   Hemoglobin 12.0 12.0 - 15.0 g/dL    Comment: REPEATED TO VERIFY DELTA CHECK NOTED    HCT 38.1 36.0 - 46.0 %   MCV 83.7 80.0 - 100.0 fL   MCH 26.4 26.0 - 34.0 pg   MCHC 31.5 30.0 - 36.0 g/dL   RDW 17.8 (H) 11.5 - 15.5 %   Platelets 388 150 - 400 K/uL   nRBC 0.6 (H) 0.0 - 0.2 %   Neutrophils Relative % 90 %   Neutro Abs 10.1 (H) 1.7 - 7.7 K/uL   Lymphocytes Relative 7 %   Lymphs Abs 0.8 0.7  - 4.0 K/uL   Monocytes Relative 2 %   Monocytes Absolute 0.3 0.1 - 1.0 K/uL   Eosinophils Relative 0 %   Eosinophils Absolute 0.0 0.0 - 0.5 K/uL   Basophils Relative 0 %   Basophils Absolute 0.0 0.0 - 0.1 K/uL   Immature Granulocytes 1 %   Abs Immature Granulocytes 0.16 (H) 0.00 - 0.07 K/uL    Comment: Performed at [facility], [place] [address], [place], [place] [address]  Protime-INR     Status: Abnormal   Collection Time: 03/10/19  6:18 AM  Result Value Ref Range   Prothrombin Time 20.4 (H) 11.4 - 15.2 seconds   INR 1.8 (H) 0.8 - 1.2    Comment: (NOTE) INR [place] varies based on device and disease states. Performed at [facility], [place] [address], [place], [place] [address]   CBG monitoring, ED     Status: Abnormal   Collection Time: 03/10/19  8:12 AM  Result Value Ref Range   Glucose-Capillary 230 (H) 70 - 99 mg/dL  CBG monitoring, ED     Status: Abnormal   Collection Time: 03/10/19  3:35 PM  Result Value Ref Range   Glucose-Capillary 173 (H) 70 - 99 mg/dL  Glucose, capillary     Status: Abnormal   Collection Time: 03/10/19  5:19 PM  Result Value Ref Range   Glucose-Capillary 182 (H) 70 - 99 mg/dL  Glucose, capillary     Status: Abnormal   Collection Time: 03/10/19  8:32 PM  Result Value Ref Range   Glucose-Capillary 158 (H) 70 - 99 mg/dL  Glucose, capillary     Status: Abnormal   Collection Time: 03/10/19 11:01 PM  Result Value Ref Range   Glucose-Capillary 166 (H) 70 - 99 mg/dL  C-reactive protein     Status: Abnormal   Collection Time: 03/11/19  4:25 AM  Result Value Ref Range   CRP 22.6 (H) <1.0 mg/dL  Comment: Performed at [facility], [place] [address], [place], [place] [address]  Ferritin     Status: Abnormal   Collection Time: 03/11/19  4:25 AM  Result Value Ref Range   Ferritin 327 (H) 11 - 307 ng/mL    Comment: Performed at [facility], [place] [address] [place], [place] [address]  Protime-INR     Status:  Abnormal   Collection Time: 03/11/19  5:22 AM  Result Value Ref Range   Prothrombin Time 23.0 (H) 11.4 - 15.2 seconds   INR 2.0 (H) 0.8 - 1.2    Comment: (NOTE) INR [place] varies based on device and disease states. Performed at [facility], [place] [address], [place], [place] [address]   Comprehensive metabolic panel     Status: Abnormal   Collection Time: 03/11/19  5:22 AM  Result Value Ref Range   Sodium 150 (H) 135 - 145 mmol/L   Potassium 3.6 3.5 - 5.1 mmol/L   Chloride 109 98 - 111 mmol/L   CO2 23 22 - 32 mmol/L   Glucose, Bld 240 (H) 70 - 99 mg/dL   BUN 26 (H) 8 - 23 mg/dL   Creatinine, Ser 0.94 0.44 - 1.00 mg/dL   Calcium 7.7 (L) 8.9 - 10.3 mg/dL   Total Protein 9.2 (H) 6.5 - 8.1 g/dL   Albumin 3.5 3.5 - 5.0 g/dL   AST 88 (H) 15 - 41 U/L   ALT 85 (H) 0 - 44 U/L   Alkaline Phosphatase 98 38 - 126 U/L   Total Bilirubin 1.2 0.3 - 1.2 mg/dL   GFR calc non Af Amer >60 >60 mL/min   GFR calc Af Amer >60 >60 mL/min   Anion gap 18 (H) 5 - 15    Comment: Performed at [facility], [place] [address] [place], [place] [address]  D-dimer, quantitative (not at [facility])     Status: Abnormal   Collection Time: 03/11/19  5:22 AM  Result Value Ref Range   D-Dimer, Quant 1.11 (H) 0.00 - 0.50 ug/mL-FEU    Comment: (NOTE) At the manufacturer cut-off of 0.50 ug/mL FEU, this assay has been documented to exclude PE with a sensitivity and negative predictive value of 97 to 99%.  At this time, this assay has not been approved by the FDA to exclude DVT/VTE. Results should be correlated with clinical presentation. Performed at [facility], [place] [address] [place], [place] [address]   Magnesium     Status: None   Collection Time: 03/11/19  5:22 AM  Result Value Ref Range   Magnesium 2.2 1.7 - 2.4 mg/dL    Comment: Performed at [facility], [place] [address] [place],  [address]  CBC with Differential/Platelet      Status: Abnormal   Collection Time: 03/11/19  5:22 AM  Result Value Ref Range   WBC 11.6 (H) 4.0 - 10.5 K/uL   RBC 4.73 3.87 - 5.11 MIL/uL   Hemoglobin 12.4 12.0 - 15.0 g/dL   HCT 39.2 36.0 - 46.0 %   MCV 82.9 80.0 - 100.0 fL   MCH 26.2 26.0 - 34.0 pg   MCHC 31.6 30.0 - 36.0 g/dL   RDW 18.2 (H) 11.5 - 15.5 %   Platelets 519 (H) 150 - 400 K/uL   nRBC 1.0 (H) 0.0 - 0.2 %   Neutrophils Relative % 86 %   Neutro Abs 10.0 (H) 1.7 - 7.7 K/uL   Lymphocytes Relative  8 %   Lymphs Abs 0.9 0.7 - 4.0 K/uL   Monocytes Relative 4 %   Monocytes Absolute 0.4 0.1 - 1.0 K/uL   Eosinophils Relative 0 %   Eosinophils Absolute 0.0 0.0 - 0.5 K/uL   Basophils Relative 0 %   Basophils Absolute 0.1 0.0 - 0.1 K/uL   Immature Granulocytes 2 %   Abs Immature Granulocytes 0.20 (H) 0.00 - 0.07 K/uL   Reactive, Benign Lymphocytes PRESENT    Target Cells PRESENT     Comment: Performed at [facility], [place] [address] [place] [address], [place], [place] [address]  Glucose, capillary     Status: Abnormal   Collection Time: 03/11/19  6:27 AM  Result Value Ref Range   Glucose-Capillary 229 (H) 70 - 99 mg/dL  Glucose, capillary     Status: Abnormal   Collection Time: 03/11/19  7:44 AM  Result Value Ref Range   Glucose-Capillary 223 (H) 70 - 99 mg/dL  Glucose, capillary     Status: Abnormal   Collection Time: 03/11/19 11:55 AM  Result Value Ref Range   Glucose-Capillary 184 (H) 70 - 99 mg/dL  Glucose, capillary     Status: Abnormal   Collection Time: 03/11/19  4:05 PM  Result Value Ref Range   Glucose-Capillary 221 (H) 70 - 99 mg/dL  Glucose, capillary     Status: Abnormal   Collection Time: 03/11/19 10:28 PM  Result Value Ref Range   Glucose-Capillary 206 (H) 70 - 99 mg/dL  Protime-INR     Status: Abnormal   Collection Time: 03/12/19  2:03 AM  Result Value Ref Range   Prothrombin Time 24.2 (H) 11.4 - 15.2 seconds   INR 2.2 (H) 0.8 - 1.2    Comment: (NOTE) INR [place] varies based on device and disease  states. Performed at [facility], [place] [address], [place], [place] [address]   Comprehensive metabolic panel     Status: Abnormal   Collection Time: 03/12/19  2:03 AM  Result Value Ref Range   Sodium 150 (H) 135 - 145 mmol/L   Potassium 3.5 3.5 - 5.1 mmol/L   Chloride 112 (H) 98 - 111 mmol/L   CO2 24 22 - 32 mmol/L   Glucose, Bld 212 (H) 70 - 99 mg/dL   BUN 32 (H) 8 - 23 mg/dL   Creatinine, Ser 1.06 (H) 0.44 - 1.00 mg/dL   Calcium 7.8 (L) 8.9 - 10.3 mg/dL   Total Protein 8.8 (H) 6.5 - 8.1 g/dL   Albumin 3.1 (L) 3.5 - 5.0 g/dL   AST 52 (H) 15 - 41 U/L   ALT 72 (H) 0 - 44 U/L   Alkaline Phosphatase 88 38 - 126 U/L   Total Bilirubin 0.9 0.3 - 1.2 mg/dL   GFR calc non Af Amer 56 (L) >60 mL/min   GFR calc Af Amer >60 >60 mL/min   Anion gap 14 5 - 15    Comment: Performed at [facility], [place] [address] [place], [place] [address]  C-reactive protein     Status: Abnormal   Collection Time: 03/12/19  2:03 AM  Result Value Ref Range   CRP 13.4 (H) <1.0 mg/dL    Comment: Performed at [facility], [place] [address], [place], [place] [address]  D-dimer, quantitative (not at [facility])     Status: Abnormal   Collection Time: 03/12/19  2:03 AM  Result Value Ref Range   D-Dimer, Quant 0.66 (H) 0.00 - 0.50 ug/mL-FEU  Comment: (NOTE) At the manufacturer cut-off of 0.50 ug/mL FEU, this assay has been documented to exclude PE with a sensitivity and negative predictive value of 97 to 99%.  At this time, this assay has not been approved by the FDA to exclude DVT/VTE. Results should be correlated with clinical presentation. Performed at [facility], [place] [address], [place], [place] [address]   Ferritin     Status: Abnormal   Collection Time: 03/12/19  2:03 AM  Result Value Ref Range   Ferritin 337 (H) 11 - 307 ng/mL    Comment: Performed at [facility], [place] [address], [place], [place] [address]   Magnesium     Status: None   Collection Time: 03/12/19  2:03 AM  Result Value Ref Range   Magnesium 2.3 1.7 - 2.4 mg/dL    Comment: Performed at [facility], [place] [address], [place], [place] [address]  CBC with Differential/Platelet     Status: Abnormal   Collection Time: 03/12/19  2:03 AM  Result Value Ref Range   WBC 9.0 4.0 - 10.5 K/uL   RBC 4.56 3.87 - 5.11 MIL/uL   Hemoglobin 12.0 12.0 - 15.0 g/dL   HCT 37.8 36.0 - 46.0 %   MCV 82.9 80.0 - 100.0 fL   MCH 26.3 26.0 - 34.0 pg   MCHC 31.7 30.0 - 36.0 g/dL   RDW 18.2 (H) 11.5 - 15.5 %   Platelets 443 (H) 150 - 400 K/uL   nRBC 1.6 (H) 0.0 - 0.2 %   Neutrophils Relative % 80 %    Comment: VACUOLATED NEUTROPHILS   Neutro Abs 7.2 1.7 - 7.7 K/uL   Lymphocytes Relative 9 %   Lymphs Abs 0.8 0.7 - 4.0 K/uL   Monocytes Relative 8 %   Monocytes Absolute 0.7 0.1 - 1.0 K/uL   Eosinophils Relative 0 %   Eosinophils Absolute 0.0 0.0 - 0.5 K/uL   Basophils Relative 1 %   Basophils Absolute 0.1 0.0 - 0.1 K/uL   Immature Granulocytes 2 %   Abs Immature Granulocytes 0.16 (H) 0.00 - 0.07 K/uL   Reactive, Benign Lymphocytes PRESENT    Polychromasia PRESENT    Target Cells PRESENT     Comment: Performed at [facility], [place] [address] [place], [place] [address]  Low molecular wgt heparin (fractionated)     Status: None   Collection Time: 03/12/19  2:03 AM  Result Value Ref Range   Heparin LMW 1.48 IU/mL    Comment:        THERAPEUTIC RANGE: DVT,PE,ACS on LMWH 1 mg/kg q12 at 4 hrs = 0.5-1.2 units/mL. DVT,PE on LMWH 1.5 mg/kg q24 at 4 hrs = 1-2 units/mL. RESULTS CONFIRMED BY MANUAL DILUTION Performed at [place] [address] [place], [place] [address]   Procalcitonin - Baseline     Status: None   Collection Time: 03/12/19  2:03 AM  Result Value Ref Range   Procalcitonin <0.10 ng/mL    Comment:        Interpretation: PCT (Procalcitonin) <= 0.5 ng/mL: Systemic  infection (sepsis) is not likely. Local bacterial infection is possible. (NOTE)       Sepsis PCT Algorithm           Lower Respiratory Tract  Infection PCT Algorithm    ----------------------------     ----------------------------         PCT < 0.25 ng/mL                PCT < 0.10 ng/mL         Strongly encourage             Strongly discourage   discontinuation of antibiotics    initiation of antibiotics    ----------------------------     -----------------------------       PCT 0.25 - 0.50 ng/mL            PCT 0.10 - 0.25 ng/mL               OR       >80% decrease in PCT            Discourage initiation of                                            antibiotics      Encourage discontinuation           of antibiotics    ----------------------------     -----------------------------         PCT >= 0.50 ng/mL              PCT 0.26 - 0.50 ng/mL               AND        <80% decrease in PCT             Encourage initiation of                                             antibiotics       Encourage continuation           of antibiotics    ----------------------------     -----------------------------        PCT >= 0.50 ng/mL                  PCT > 0.50 ng/mL               AND         increase in PCT                  Strongly encourage                                      initiation of antibiotics    Strongly encourage escalation           of antibiotics                                     -----------------------------                                           PCT <= 0.25 ng/mL  OR                                        > 80% decrease in PCT                                     Discontinue / Do not initiate                                             antibiotics Performed at [place] [address] [place], [place] [address]   Glucose, capillary     Status: Abnormal   Collection  Time: 03/12/19  7:36 AM  Result Value Ref Range   Glucose-Capillary 228 (H) 70 - 99 mg/dL  Glucose, capillary     Status: Abnormal   Collection Time: 03/12/19 11:34 AM  Result Value Ref Range   Glucose-Capillary 280 (H) 70 - 99 mg/dL  Glucose, capillary     Status: Abnormal   Collection Time: 03/12/19  4:40 PM  Result Value Ref Range   Glucose-Capillary 264 (H) 70 - 99 mg/dL  Basic metabolic panel     Status: Abnormal   Collection Time: 03/12/19  6:10 PM  Result Value Ref Range   Sodium 149 (H) 135 - 145 mmol/L   Potassium 3.0 (L) 3.5 - 5.1 mmol/L   Chloride 114 (H) 98 - 111 mmol/L   CO2 22 22 - 32 mmol/L   Glucose, Bld 228 (H) 70 - 99 mg/dL   BUN 36 (H) 8 - 23 mg/dL   Creatinine, Ser 1.00 0.44 - 1.00 mg/dL   Calcium 7.4 (L) 8.9 - 10.3 mg/dL   GFR calc non Af Amer 60 (L) >60 mL/min   GFR calc Af Amer >60 >60 mL/min   Anion gap 13 5 - 15    Comment: Performed at [facility], [place] [address], [place], [place] [address]  Protime-INR     Status: Abnormal   Collection Time: 03/13/19  5:00 AM  Result Value Ref Range   Prothrombin Time 22.8 (H) 11.4 - 15.2 seconds   INR 2.0 (H) 0.8 - 1.2    Comment: (NOTE) INR [place] varies based on device and disease states. Performed at [facility], [place] [address], [place], [place] [address]   Comprehensive metabolic panel     Status: Abnormal   Collection Time: 03/13/19  5:00 AM  Result Value Ref Range   Sodium 152 (H) 135 - 145 mmol/L   Potassium 3.0 (L) 3.5 - 5.1 mmol/L   Chloride 114 (H) 98 - 111 mmol/L   CO2 25 22 - 32 mmol/L   Glucose, Bld 184 (H) 70 - 99 mg/dL   BUN 40 (H) 8 - 23 mg/dL   Creatinine, Ser 1.03 (H) 0.44 - 1.00 mg/dL   Calcium 7.8 (L) 8.9 - 10.3 mg/dL   Total Protein 8.2 (H) 6.5 - 8.1 g/dL   Albumin 3.0 (L) 3.5 - 5.0 g/dL   AST 48 (H) 15 - 41 U/L   ALT 61 (H) 0 - 44 U/L   Alkaline Phosphatase 82 38 - 126 U/L   Total Bilirubin 0.5 0.3 - 1.2 mg/dL   GFR calc  non Af Amer 58 (L) >60  mL/min   GFR calc Af Amer >60 >60 mL/min   Anion gap 13 5 - 15    Comment: Performed at [facility], [place] [address], [place], [place] [address]  C-reactive protein     Status: Abnormal   Collection Time: 03/13/19  5:00 AM  Result Value Ref Range   CRP 5.7 (H) <1.0 mg/dL    Comment: Performed at [facility], [place] [address], [place], [place] [address]  D-dimer, quantitative (not at [facility])     Status: None   Collection Time: 03/13/19  5:00 AM  Result Value Ref Range   D-Dimer, Quant 0.38 0.00 - 0.50 ug/mL-FEU    Comment: (NOTE) At the manufacturer cut-off of 0.50 ug/mL FEU, this assay has been documented to exclude PE with a sensitivity and negative predictive value of 97 to 99%.  At this time, this assay has not been approved by the FDA to exclude DVT/VTE. Results should be correlated with clinical presentation. Performed at [facility], [place] [address] [place], [place] [address]   Ferritin     Status: None   Collection Time: 03/13/19  5:00 AM  Result Value Ref Range   Ferritin 264 11 - 307 ng/mL    Comment: Performed at [facility] [place] [address] [place], [place] [address]  Magnesium     Status: None   Collection Time: 03/13/19  5:00 AM  Result Value Ref Range   Magnesium 2.1 1.7 - 2.4 mg/dL    Comment: Performed at [facility], [place] [address] [place], [place] [address]  CBC with Differential/Platelet     Status: Abnormal   Collection Time: 03/13/19  5:00 AM  Result Value Ref Range   WBC 10.2 4.0 - 10.5 K/uL   RBC 4.59 3.87 - 5.11 MIL/uL   Hemoglobin 11.8 (L) 12.0 - 15.0 g/dL   HCT 38.1 36.0 - 46.0 %   MCV 83.0 80.0 - 100.0 fL   MCH 25.7 (L) 26.0 - 34.0 pg   MCHC 31.0 30.0 - 36.0 g/dL   RDW 18.5 (H) 11.5 - 15.5 %   Platelets 537 (H) 150 - 400 K/uL   nRBC 2.1 (H) 0.0 - 0.2 %   Neutrophils Relative % 76 %   Neutro Abs 7.7 1.7 - 7.7 K/uL   Lymphocytes Relative 13 %    Lymphs Abs 1.3 0.7 - 4.0 K/uL   Monocytes Relative 9 %   Monocytes Absolute 0.9 0.1 - 1.0 K/uL   Eosinophils Relative 0 %   Eosinophils Absolute 0.0 0.0 - 0.5 K/uL   Basophils Relative 0 %   Basophils Absolute 0.0 0.0 - 0.1 K/uL   Immature Granulocytes 2 %   Abs Immature Granulocytes 0.20 (H) 0.00 - 0.07 K/uL    Comment: Performed at [facility], [place] [address] [place], [place] [address]  Glucose, capillary     Status: Abnormal   Collection Time: 03/13/19  7:33 AM  Result Value Ref Range   Glucose-Capillary 192 (H) 70 - 99 mg/dL  Glucose, capillary     Status: Abnormal   Collection Time: 03/13/19 11:53 AM  Result Value Ref Range   Glucose-Capillary 174 (H) 70 - 99 mg/dL  Basic metabolic panel     Status: Abnormal   Collection Time: 03/13/19  4:12 PM  Result Value Ref Range   Sodium 152 (H) 135 - 145 mmol/L   Potassium 3.6 3.5 -  5.1 mmol/L   Chloride 115 (H) 98 - 111 mmol/L   CO2 25 22 - 32 mmol/L   Glucose, Bld 263 (H) 70 - 99 mg/dL   BUN 33 (H) 8 - 23 mg/dL   Creatinine, Ser 0.96 0.44 - 1.00 mg/dL   Calcium 8.0 (L) 8.9 - 10.3 mg/dL   GFR calc non Af Amer >60 >60 mL/min   GFR calc Af Amer >60 >60 mL/min   Anion gap 12 5 - 15    Comment: Performed at [facility], [place] [address], [place], [place] [address]  Brain natriuretic peptide     Status: None   Collection Time: 03/13/19  4:12 PM  Result Value Ref Range   B Natriuretic Peptide 64.8 0.0 - 100.0 pg/mL    Comment: Performed at [facility], [place] [address], [place], [place] [address]  Glucose, capillary     Status: Abnormal   Collection Time: 03/13/19  4:12 PM  Result Value Ref Range   Glucose-Capillary 250 (H) 70 - 99 mg/dL  Glucose, capillary     Status: Abnormal   Collection Time: 03/13/19  8:24 PM  Result Value Ref Range   Glucose-Capillary 246 (H) 70 - 99 mg/dL  Protime-INR     Status: Abnormal   Collection Time: 03/14/19  3:20 AM  Result Value Ref Range    Prothrombin Time 23.5 (H) 11.4 - 15.2 seconds   INR 2.1 (H) 0.8 - 1.2    Comment: (NOTE) INR [place] varies based on device and disease states. Performed at [facility], [place] [address], [place], [place] [address]   Comprehensive metabolic panel     Status: Abnormal   Collection Time: 03/14/19  3:20 AM  Result Value Ref Range   Sodium 150 (H) 135 - 145 mmol/L   Potassium 3.4 (L) 3.5 - 5.1 mmol/L   Chloride 113 (H) 98 - 111 mmol/L   CO2 26 22 - 32 mmol/L   Glucose, Bld 176 (H) 70 - 99 mg/dL   BUN 31 (H) 8 - 23 mg/dL   Creatinine, Ser 0.89 0.44 - 1.00 mg/dL   Calcium 8.0 (L) 8.9 - 10.3 mg/dL   Total Protein 8.3 (H) 6.5 - 8.1 g/dL   Albumin 3.1 (L) 3.5 - 5.0 g/dL   AST 50 (H) 15 - 41 U/L   ALT 59 (H) 0 - 44 U/L   Alkaline Phosphatase 73 38 - 126 U/L   Total Bilirubin 0.9 0.3 - 1.2 mg/dL   GFR calc non Af Amer >60 >60 mL/min   GFR calc Af Amer >60 >60 mL/min   Anion gap 11 5 - 15    Comment: Performed at [facility], [place] [address] [place], [place] [address]  C-reactive protein     Status: Abnormal   Collection Time: 03/14/19  3:20 AM  Result Value Ref Range   CRP 4.6 (H) <1.0 mg/dL    Comment: Performed at [facility], [place] [address], [place], [place] [address]  D-dimer, quantitative (not at [facility])     Status: Abnormal   Collection Time: 03/14/19  3:20 AM  Result Value Ref Range   D-Dimer, Quant 0.68 (H) 0.00 - 0.50 ug/mL-FEU    Comment: (NOTE) At the manufacturer cut-off of 0.50 ug/mL FEU, this assay has been documented to exclude PE with a sensitivity and negative predictive value of 97 to 99%.  At this time, this assay has not been approved by the FDA  to exclude DVT/VTE. Results should be correlated with clinical presentation. Performed at [facility], [place] [address] [place], [place] [address]   Ferritin     Status: None   Collection Time: 03/14/19  3:20 AM  Result Value Ref Range    Ferritin 228 11 - 307 ng/mL    Comment: Performed at [facility], [place] [address], [place], [place] [address]  Magnesium     Status: None   Collection Time: 03/14/19  3:20 AM  Result Value Ref Range   Magnesium 1.9 1.7 - 2.4 mg/dL    Comment: Performed at [facility], [place] [address] [place], [place] [address]  CBC with Differential/Platelet     Status: Abnormal   Collection Time: 03/14/19  3:20 AM  Result Value Ref Range   WBC 9.8 4.0 - 10.5 K/uL   RBC 4.45 3.87 - 5.11 MIL/uL   Hemoglobin 11.6 (L) 12.0 - 15.0 g/dL   HCT 37.6 36.0 - 46.0 %   MCV 84.5 80.0 - 100.0 fL   MCH 26.1 26.0 - 34.0 pg   MCHC 30.9 30.0 - 36.0 g/dL   RDW 18.7 (H) 11.5 - 15.5 %   Platelets 456 (H) 150 - 400 K/uL   nRBC 2.7 (H) 0.0 - 0.2 %   Neutrophils Relative % 75 %   Neutro Abs 7.4 1.7 - 7.7 K/uL   Lymphocytes Relative 14 %   Lymphs Abs 1.4 0.7 - 4.0 K/uL   Monocytes Relative 9 %   Monocytes Absolute 0.8 0.1 - 1.0 K/uL   Eosinophils Relative 0 %   Eosinophils Absolute 0.0 0.0 - 0.5 K/uL   Basophils Relative 0 %   Basophils Absolute 0.0 0.0 - 0.1 K/uL   Immature Granulocytes 2 %   Abs Immature Granulocytes 0.18 (H) 0.00 - 0.07 K/uL    Comment: Performed at [facility], [place] [address] [place],  [address]  Glucose, capillary     Status: Abnormal   Collection Time: 03/14/19  7:57 AM  Result Value Ref Range   Glucose-Capillary 205 (H) 70 - 99 mg/dL  Glucose, capillary     Status: Abnormal   Collection Time: 03/14/19 11:56 AM  Result Value Ref Range   Glucose-Capillary 187 (H) 70 - 99 mg/dL  Urinalysis, Routine w reflex microscopic     Status: Abnormal   Collection Time: 03/14/19  2:15 PM  Result Value Ref Range   Color, Urine YELLOW YELLOW   APPearance TURBID (A) CLEAR   Specific Gravity, Urine 1.026 1.005 - 1.030   pH 5.0 5.0 - 8.0   Glucose, UA 50 (A) NEGATIVE mg/dL   Hgb urine dipstick MODERATE (A) NEGATIVE   Bilirubin Urine  NEGATIVE NEGATIVE   Ketones, ur NEGATIVE NEGATIVE mg/dL   Protein, ur 30 (A) NEGATIVE mg/dL   Nitrite NEGATIVE NEGATIVE   Leukocytes,Ua NEGATIVE NEGATIVE   RBC / HPF >50 (H) 0 - 5 RBC/hpf   Bacteria, UA NONE SEEN NONE SEEN   Squamous Epithelial / LPF 0-5 0 - 5   Mucus PRESENT    Budding Yeast PRESENT     Comment: Performed at [facility], [place] [address], [place], [place] [address]  I-STAT 7, (LYTES, BLD GAS, ICA, H+H)     Status: Abnormal   Collection Time: 03/14/19  4:03 PM  Result Value Ref Range   pH, Arterial 7.463 (H) 7.350 - 7.450   pCO2 arterial 35.8 32.0 - 48.0 mmHg  pO2, Arterial 82.0 (L) 83.0 - 108.0 mmHg   Bicarbonate 25.7 20.0 - 28.0 mmol/L   TCO2 27 22 - 32 mmol/L   O2 Saturation 97.0 %   Acid-Base Excess 2.0 0.0 - 2.0 mmol/L   Sodium 146 (H) 135 - 145 mmol/L   Potassium 3.6 3.5 - 5.1 mmol/L   Calcium, Ion 1.05 (L) 1.15 - 1.40 mmol/L   HCT 36.0 36.0 - 46.0 %   Hemoglobin 12.2 12.0 - 15.0 g/dL   Patient temperature HIDE    Sample type ARTERIAL   Basic metabolic panel     Status: Abnormal   Collection Time: 03/14/19  5:20 PM  Result Value Ref Range   Sodium 144 135 - 145 mmol/L   Potassium 3.8 3.5 - 5.1 mmol/L   Chloride 108 98 - 111 mmol/L   CO2 25 22 - 32 mmol/L   Glucose, Bld 277 (H) 70 - 99 mg/dL   BUN 23 8 - 23 mg/dL   Creatinine, Ser 0.78 0.44 - 1.00 mg/dL   Calcium 7.8 (L) 8.9 - 10.3 mg/dL   GFR calc non Af Amer >60 >60 mL/min   GFR calc Af Amer >60 >60 mL/min   Anion gap 11 5 - 15    Comment: Performed at [facility], [place] [address] [place], [place] [address]  Ammonia     Status: Abnormal   Collection Time: 03/14/19  5:20 PM  Result Value Ref Range   Ammonia 47 (H) 9 - 35 umol/L    Comment: Performed at [facility], [place] [address] [place], [place] [address]  Glucose, capillary     Status: Abnormal   Collection Time: 03/14/19  5:37 PM  Result Value Ref Range   Glucose-Capillary 252 (H)  70 - 99 mg/dL  Glucose, capillary     Status: Abnormal   Collection Time: 03/14/19  8:07 PM  Result Value Ref Range   Glucose-Capillary 233 (H) 70 - 99 mg/dL  Protime-INR     Status: Abnormal   Collection Time: 03/15/19  6:00 AM  Result Value Ref Range   Prothrombin Time 26.9 (H) 11.4 - 15.2 seconds   INR 2.5 (H) 0.8 - 1.2    Comment: (NOTE) INR [place] varies based on device and disease states. Performed at [facility], [place] [address] [place], [place] [address]   Comprehensive metabolic panel     Status: Abnormal   Collection Time: 03/15/19  6:00 AM  Result Value Ref Range   Sodium 141 135 - 145 mmol/L   Potassium 3.4 (L) 3.5 - 5.1 mmol/L   Chloride 105 98 - 111 mmol/L   CO2 24 22 - 32 mmol/L   Glucose, Bld 151 (H) 70 - 99 mg/dL   BUN 18 8 - 23 mg/dL   Creatinine, Ser 0.70 0.44 - 1.00 mg/dL   Calcium 7.7 (L) 8.9 - 10.3 mg/dL   Total Protein 7.2 6.5 - 8.1 g/dL   Albumin 2.8 (L) 3.5 - 5.0 g/dL   AST 41 15 - 41 U/L   ALT 50 (H) 0 - 44 U/L   Alkaline Phosphatase 66 38 - 126 U/L   Total Bilirubin 0.5 0.3 - 1.2 mg/dL   GFR calc non Af Amer >60 >60 mL/min   GFR calc Af Amer >60 >60 mL/min   Anion gap 12 5 - 15    Comment: Performed at [facility], [place] [address] [place], [place] [address]  C-reactive protein  Status: Abnormal   Collection Time: 03/15/19  6:00 AM  Result Value Ref Range   CRP 3.6 (H) <1.0 mg/dL    Comment: Performed at [facility], [place] [address], [place], [place] [address]  D-dimer, quantitative (not at [facility])     Status: Abnormal   Collection Time: 03/15/19  6:00 AM  Result Value Ref Range   D-Dimer, Quant 0.91 (H) 0.00 - 0.50 ug/mL-FEU    Comment: (NOTE) At the manufacturer cut-off of 0.50 ug/mL FEU, this assay has been documented to exclude PE with a sensitivity and negative predictive value of 97 to 99%.  At this time, this assay has not been approved by the FDA to exclude DVT/VTE. Results  should be correlated with clinical presentation. Performed at [facility], [place] [address], [place], [place] [address]   Ferritin     Status: None   Collection Time: 03/15/19  6:00 AM  Result Value Ref Range   Ferritin 208 11 - 307 ng/mL    Comment: Performed at [facility], [place] [address], [place], [place] [address]  Magnesium     Status: Abnormal   Collection Time: 03/15/19  6:00 AM  Result Value Ref Range   Magnesium 1.4 (L) 1.7 - 2.4 mg/dL    Comment: Performed at [facility], [place] [address], [place], [place] [address]  CBC with Differential/Platelet     Status: Abnormal   Collection Time: 03/15/19  6:00 AM  Result Value Ref Range   WBC 9.9 4.0 - 10.5 K/uL   RBC 4.30 3.87 - 5.11 MIL/uL   Hemoglobin 11.2 (L) 12.0 - 15.0 g/dL   HCT 36.0 36.0 - 46.0 %   MCV 83.7 80.0 - 100.0 fL   MCH 26.0 26.0 - 34.0 pg   MCHC 31.1 30.0 - 36.0 g/dL   RDW 18.4 (H) 11.5 - 15.5 %   Platelets 357 150 - 400 K/uL   nRBC 1.1 (H) 0.0 - 0.2 %   Neutrophils Relative % 72 %   Neutro Abs 7.1 1.7 - 7.7 K/uL   Lymphocytes Relative 18 %   Lymphs Abs 1.8 0.7 - 4.0 K/uL   Monocytes Relative 7 %   Monocytes Absolute 0.7 0.1 - 1.0 K/uL   Eosinophils Relative 1 %   Eosinophils Absolute 0.1 0.0 - 0.5 K/uL   Basophils Relative 0 %   Basophils Absolute 0.0 0.0 - 0.1 K/uL   Immature Granulocytes 2 %   Abs Immature Granulocytes 0.21 (H) 0.00 - 0.07 K/uL    Comment: Performed at [facility], [place] [address], [place], [place] [address]  Glucose, capillary     Status: Abnormal   Collection Time: 03/15/19  8:27 AM  Result Value Ref Range   Glucose-Capillary 151 (H) 70 - 99 mg/dL  Glucose, capillary     Status: Abnormal   Collection Time: 03/15/19 12:02 PM  Result Value Ref Range   Glucose-Capillary 220 (H) 70 - 99 mg/dL  Glucose, capillary     Status: Abnormal   Collection Time: 03/15/19  5:44 PM  Result Value Ref Range    Glucose-Capillary 247 (H) 70 - 99 mg/dL  Glucose, capillary     Status: Abnormal   Collection Time: 03/15/19  9:44 PM  Result Value Ref Range   Glucose-Capillary 214 (H) 70 - 99 mg/dL  Protime-INR     Status: Abnormal   Collection Time: 03/16/19  5:50 AM  Result Value Ref Range  Prothrombin Time 26.8 (H) 11.4 - 15.2 seconds   INR 2.5 (H) 0.8 - 1.2    Comment: (NOTE) INR [place] varies based on device and disease states. Performed at [facility], [place] [address], [place], [place] [address]   amcbc     Status: Abnormal   Collection Time: 03/16/19  5:50 AM  Result Value Ref Range   WBC 9.9 4.0 - 10.5 K/uL   RBC 4.18 3.87 - 5.11 MIL/uL   Hemoglobin 10.9 (L) 12.0 - 15.0 g/dL   HCT 34.7 (L) 36.0 - 46.0 %   MCV 83.0 80.0 - 100.0 fL   MCH 26.1 26.0 - 34.0 pg   MCHC 31.4 30.0 - 36.0 g/dL   RDW 18.2 (H) 11.5 - 15.5 %   Platelets 308 150 - 400 K/uL   nRBC 0.6 (H) 0.0 - 0.2 %    Comment: Performed at [facility], [place] [address] [place], [place] [address]  ambmet     Status: Abnormal   Collection Time: 03/16/19  5:50 AM  Result Value Ref Range   Sodium 136 135 - 145 mmol/L   Potassium 3.5 3.5 - 5.1 mmol/L   Chloride 96 (L) 98 - 111 mmol/L   CO2 24 22 - 32 mmol/L   Glucose, Bld 167 (H) 70 - 99 mg/dL   BUN 17 8 - 23 mg/dL   Creatinine, Ser 0.67 0.44 - 1.00 mg/dL   Calcium 7.6 (L) 8.9 - 10.3 mg/dL   GFR calc non Af Amer >60 >60 mL/min   GFR calc Af Amer >60 >60 mL/min   Anion gap 16 (H) 5 - 15    Comment: Performed at [facility], [place] [address], [place], [place] [address]  C-reactive protein     Status: Abnormal   Collection Time: 03/16/19  5:50 AM  Result Value Ref Range   CRP 2.9 (H) <1.0 mg/dL    Comment: Performed at [facility], [place] [address] [place], [place] [address]  D-dimer, quantitative (not at [facility])     Status: Abnormal   Collection Time: 03/16/19  5:50 AM  Result Value Ref Range   D-Dimer,  Quant 1.06 (H) 0.00 - 0.50 ug/mL-FEU    Comment: (NOTE) At the manufacturer cut-off of 0.50 ug/mL FEU, this assay has been documented to exclude PE with a sensitivity and negative predictive value of 97 to 99%.  At this time, this assay has not been approved by the FDA to exclude DVT/VTE. Results should be correlated with clinical presentation. Performed at [facility], [place] [address] [place], [place] [address]   Ferritin     Status: None   Collection Time: 03/16/19  5:50 AM  Result Value Ref Range   Ferritin 200 11 - 307 ng/mL    Comment: Performed at [facility], [place] [address] [place], [place] [address]  Ammonia     Status: Abnormal   Collection Time: 03/16/19  5:50 AM  Result Value Ref Range   Ammonia 53 (H) 9 - 35 umol/L    Comment: Performed at [facility], [place] [address], [place], [place] [address]  Glucose, capillary     Status: Abnormal   Collection Time: 03/16/19  7:44 AM  Result Value Ref Range   Glucose-Capillary 169 (H) 70 - 99 mg/dL  Glucose, capillary     Status: Abnormal   Collection Time: 03/16/19 12:13 PM  Result Value Ref Range   Glucose-Capillary  230 (H) 70 - 99 mg/dL  Novel Coronavirus, NAA ([facility])     Status: None   Collection Time: 04/04/19  5:43 PM   Specimen: Nasopharyngeal(NP) swabs in vial transport medium   NASOPHARYNGE  TESTING  Result Value Ref Range   SARS-CoV-2, NAA Not Detected Not Detected    Comment: This nucleic acid amplification test was developed and its performance characteristics determined by [employer]. Nucleic acid amplification tests include PCR and TMA. This test has not been FDA cleared or approved. This test has been authorized by FDA under an Emergency Use Authorization (EUA). This test is only authorized for the duration of time the declaration that circumstances exist justifying the authorization of the emergency use of in vitro diagnostic tests for  detection of SARS-CoV-2 virus and/or diagnosis of COVID-19 infection under section 564(b)(1) of the Act, 21 U.S.C. 712HKN-1(U) (1), unless the authorization is terminated or revoked sooner. When diagnostic testing is negative, the possibility of a false negative result should be considered in the context of a patient's recent exposures and the presence of clinical signs and symptoms consistent with COVID-19. An individual without symptoms of COVID-19 and who is not shedding SARS-CoV-2 virus would  expect to have a negative (not detected) result in this assay.     Assessment/Plan: Chronic obstructive pulmonary disease, unspecified COPD type ([place]) -continue current inhalers -consider PT as recent hospitalization likely caused deconditioning which is contributing to DOE. -given precautions  Essential hypertension -controlled -continue current medications -continue lifestyle modifications  History of DVT (deep vein thrombosis) -continue coumadin 5 mg S, T, Th, Sa -given precautions -discussed rechecking INR  Adrenal mass, right ([place]) -continue regular imaging to monitor  -continue f/u with Nephrology  Chronic bilateral low back pain with bilateral sciatica -continue f/u with Ortho -discussed lifestyle modifications and weight loss  Chronic pain of right knee -continue f/u with Ortho -lifestyle modifications encouraged  Chronic diastolic congestive heart failure ([place]) -continue current meds -discussed lifestyle modifications -will review records when made available -consider referral to Cardiology  Encounter to establish care -We reviewed the PMH, PSH, FH, SH, Meds and Allergies. -We provided refills for any medications we will prescribe as needed. -We addressed current concerns per orders and patient instructions. -We have asked for records for pertinent exams, studies, vaccines and notes from previous providers. -We have advised patient to follow up per instructions  below.  OSA on CPAP -regular cleaning of mask and tubing encouraged. -recent cough that improved after stopping CPAP may be related to allergies.   F/u in 1 month  [name], MD

## 2019-04-28 ENCOUNTER — Telehealth: Payer: Self-pay | Admitting: *Deleted

## 2019-04-28 ENCOUNTER — Inpatient Hospital Stay: Payer: Medicare Other

## 2019-04-28 ENCOUNTER — Inpatient Hospital Stay: Payer: Medicare Other | Attending: Oncology | Admitting: Oncology

## 2019-04-28 ENCOUNTER — Other Ambulatory Visit: Payer: Self-pay

## 2019-04-28 VITALS — BP 121/61 | HR 93 | Temp 98.0°F | Resp 18 | Ht 66.0 in | Wt 276.1 lb

## 2019-04-28 DIAGNOSIS — I82403 Acute embolism and thrombosis of unspecified deep veins of lower extremity, bilateral: Secondary | ICD-10-CM

## 2019-04-28 DIAGNOSIS — J449 Chronic obstructive pulmonary disease, unspecified: Secondary | ICD-10-CM | POA: Diagnosis not present

## 2019-04-28 DIAGNOSIS — I10 Essential (primary) hypertension: Secondary | ICD-10-CM | POA: Diagnosis not present

## 2019-04-28 DIAGNOSIS — Z86718 Personal history of other venous thrombosis and embolism: Secondary | ICD-10-CM | POA: Insufficient documentation

## 2019-04-28 DIAGNOSIS — D6862 Lupus anticoagulant syndrome: Secondary | ICD-10-CM | POA: Insufficient documentation

## 2019-04-28 DIAGNOSIS — D649 Anemia, unspecified: Secondary | ICD-10-CM | POA: Diagnosis not present

## 2019-04-28 DIAGNOSIS — Z7901 Long term (current) use of anticoagulants: Secondary | ICD-10-CM

## 2019-04-28 DIAGNOSIS — Z90710 Acquired absence of both cervix and uterus: Secondary | ICD-10-CM | POA: Diagnosis not present

## 2019-04-28 LAB — PROTIME-INR
INR: 2.5 — ABNORMAL HIGH (ref 0.8–1.2)
Prothrombin Time: 27.3 seconds — ABNORMAL HIGH (ref 11.4–15.2)

## 2019-04-28 NOTE — Progress Notes (Signed)
  [place] OFFICE PROGRESS NOTE   Diagnosis: History of deep vein thrombosis, on Coumadin anticoagulation  INTERVAL HISTORY:   [name] returns for a scheduled visit.  She continues Coumadin anticoagulation.  She was admitted COVID-19 in December.  She reports respiratory distress during the infection.  Her dyspnea is slowly improving.  No bleeding or symptom of thrombosis.  Objective:  Vital signs in last 24 hours:  Blood pressure 121/61, pulse 93, temperature 98 F (36.7 C), temperature source Temporal, resp. rate 18, height 5\' 6"  (1.676 m), weight 276 lb 1.6 oz (125.2 kg), SpO2 97 %.    Resp: End inspiratory rales at the lower posterior chest bilaterally Cardio: Regular rate and rhythm Vascular: Obesity throughout the legs, no erythema or palpable cord, the left leg is slightly larger than the right side   Lab Results:  Lab Results  Component Value Date   WBC 9.9 03/16/2019   HGB 10.9 (L) 03/16/2019   HCT 34.7 (L) 03/16/2019   MCV 83.0 03/16/2019   PLT 308 03/16/2019   NEUTROABS 7.1 03/15/2019    CMP  Lab Results  Component Value Date   NA 136 03/16/2019   K 3.5 03/16/2019   CL 96 (L) 03/16/2019   CO2 24 03/16/2019   GLUCOSE 167 (H) 03/16/2019   BUN 17 03/16/2019   CREATININE 0.67 03/16/2019   CALCIUM 7.6 (L) 03/16/2019   PROT 7.2 03/15/2019   ALBUMIN 2.8 (L) 03/15/2019   AST 41 03/15/2019   ALT 50 (H) 03/15/2019   ALKPHOS 66 03/15/2019   BILITOT 0.5 03/15/2019   GFRNONAA >60 03/16/2019   GFRAA >60 03/16/2019      Lab Results  Component Value Date   INR 2.5 (H) 03/16/2019     Medications: I have reviewed the patient's current medications.   Assessment/Plan: 1. History of bilateral lower extremity deep vein thrombosis. She continues Coumadin anticoagulation. 2. History of a positive lupus anticoagulant. 3. History of a positive beta-2-glycoprotein IgA antibody. 4. Chronic bilateral leg pain - negative bilateral Doppler January  2010. 5. History of hypertension. 6. COPD 7. Status post hysterectomy. 8. Remote history of cocaine abuse. 9. History of an adrenal lesion on a CT scan in 2008 - status post a repeat CT on June 16, 2009, showing slow enlargement of the right adrenal nodule since 2006, most consistent with an adenoma. Stable on a CT 04/24/2014 10. History of rectal and vaginal bleeding in May 2009.  11. Status post carpal tunnel surgery. 12. 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 Right knee replacement September 2011. 13. Multiple orthopedic conditions. 14. Placement of IVC filter preoperatively before knee replacement. The IVC filter was retrieved 03/01/2011. 15. Anemia. Ferritin in normal range at 82 on 08/18/2013; 61 on 07/10/2016 16. Status post lumbar fusion surgery October 2013 17. Right hydronephrosis-evaluated by Dr. Tresa Moore 18. Left ankle swelling/pain 07/24/2016 19. Mild anemia noted on hospital admission 2018 20. Colonoscopy 04/19/2017-entire examined colon normal.  Repeat colonoscopy in 10 years for surveillance. 21. Admission with COVID-19 infection 03/09/2019    Disposition: [name] continues chronic Coumadin anticoagulation.  We will follow up on the PT/INR today and adjust the Coumadin dose as indicated.  She continues to recover from the recent COVID-19 infection.  [name] will return for a lab visit in 1 month and an office visit in 6 months.  [name], MD  04/28/2019  8:45 AM

## 2019-04-28 NOTE — Telephone Encounter (Signed)
Notified INR is therapeutic and continue current coumadin dose: 10 mg MWF and 5 mg all other days. Recheck 3/8

## 2019-05-20 ENCOUNTER — Encounter: Payer: Self-pay | Admitting: Podiatry

## 2019-05-20 ENCOUNTER — Other Ambulatory Visit: Payer: Self-pay

## 2019-05-20 ENCOUNTER — Ambulatory Visit (INDEPENDENT_AMBULATORY_CARE_PROVIDER_SITE_OTHER): Payer: Medicare Other | Admitting: Podiatry

## 2019-05-20 VITALS — Temp 97.3°F

## 2019-05-20 DIAGNOSIS — M79675 Pain in left toe(s): Secondary | ICD-10-CM

## 2019-05-20 DIAGNOSIS — L600 Ingrowing nail: Secondary | ICD-10-CM | POA: Diagnosis not present

## 2019-05-20 DIAGNOSIS — B351 Tinea unguium: Secondary | ICD-10-CM | POA: Diagnosis not present

## 2019-05-20 DIAGNOSIS — M79674 Pain in right toe(s): Secondary | ICD-10-CM

## 2019-05-20 NOTE — Patient Instructions (Addendum)
EPSOM SALT FOOT SOAK INSTRUCTIONS  Shopping List:  A. Plain epsom salt (not scented) B. Neosporin Cream/Ointment or Bacitracin Cream/Ointment C. 1-inch fabric band-aids   1.  Place 1/4 cup of epsom salts in 2 quarts of warm tap water. IF YOU ARE DIABETIC, OR HAVE NEUROPATHY, CHECK THE TEMPERATURE OF THE WATER WITH YOUR ELBOW.  2.  Submerge your left  Foot in the solution and soak for 10-15 minutes.      3.  Next, remove your foot or feet from solution, blot dry the affected area.    4.  Apply antibiotic ointment and cover with fabric band-aid .  5.  This soak should be done once a day for 7 days.   6.  Monitor for any signs/symptoms of infection such as redness, swelling, odor, drainage, increased pain, or non-healing of digit.   7.  Please do not hesitate to call the office and speak to a Nurse or Doctor if you have questions.   8.  If you experience fever, chills, nightsweats, nausea or vomiting with worsening of digit, please go to the emergency room.

## 2019-05-21 ENCOUNTER — Encounter: Payer: Self-pay | Admitting: Family Medicine

## 2019-05-21 ENCOUNTER — Ambulatory Visit: Payer: Medicare Other

## 2019-05-21 ENCOUNTER — Ambulatory Visit (INDEPENDENT_AMBULATORY_CARE_PROVIDER_SITE_OTHER): Payer: Medicare Other | Admitting: Family Medicine

## 2019-05-21 VITALS — BP 110/78 | HR 96 | Temp 97.8°F | Wt 278.0 lb

## 2019-05-21 DIAGNOSIS — G4733 Obstructive sleep apnea (adult) (pediatric): Secondary | ICD-10-CM

## 2019-05-21 DIAGNOSIS — J449 Chronic obstructive pulmonary disease, unspecified: Secondary | ICD-10-CM | POA: Diagnosis not present

## 2019-05-21 DIAGNOSIS — Z9989 Dependence on other enabling machines and devices: Secondary | ICD-10-CM

## 2019-05-21 DIAGNOSIS — I10 Essential (primary) hypertension: Secondary | ICD-10-CM | POA: Diagnosis not present

## 2019-05-21 DIAGNOSIS — E118 Type 2 diabetes mellitus with unspecified complications: Secondary | ICD-10-CM

## 2019-05-21 DIAGNOSIS — Z86718 Personal history of other venous thrombosis and embolism: Secondary | ICD-10-CM | POA: Diagnosis not present

## 2019-05-21 DIAGNOSIS — R5383 Other fatigue: Secondary | ICD-10-CM

## 2019-05-21 LAB — POCT GLUCOSE (DEVICE FOR HOME USE): POC Glucose: 152 mg/dl — AB (ref 70–99)

## 2019-05-21 LAB — POCT GLYCOSYLATED HEMOGLOBIN (HGB A1C): Hemoglobin A1C: 7.2 % — AB (ref 4.0–5.6)

## 2019-05-21 NOTE — Patient Instructions (Signed)
Chronic Obstructive Pulmonary Disease Chronic obstructive pulmonary disease (COPD) is a long-term (chronic) lung problem. When you have COPD, it is hard for air to get in and out of your lungs. Usually the condition gets worse over time, and your lungs will never return to normal. There are things you can do to keep yourself as healthy as possible.  Your doctor may treat your condition with: ? Medicines. ? Oxygen. ? Lung surgery.  Your doctor may also recommend: ? Rehabilitation. This includes steps to make your body work better. It may involve a team of specialists. ? Quitting smoking, if you smoke. ? Exercise and changes to your diet. ? Comfort measures (palliative care). Follow these instructions at home: Medicines  Take over-the-counter and prescription medicines only as told by your doctor.  Talk to your doctor before taking any cough or allergy medicines. You may need to avoid medicines that cause your lungs to be dry. Lifestyle  If you smoke, stop. Smoking makes the problem worse. If you need help quitting, ask your doctor.  Avoid being around things that make your breathing worse. This may include smoke, chemicals, and fumes.  Stay active, but remember to rest as well.  Learn and use tips on how to relax.  Make sure you get enough sleep. Most adults need at least 7 hours of sleep every night.  Eat healthy foods. Eat smaller meals more often. Rest before meals. Controlled breathing Learn and use tips on how to control your breathing as told by your doctor. Try:  Breathing in (inhaling) through your nose for 1 second. Then, pucker your lips and breath out (exhale) through your lips for 2 seconds.  Putting one hand on your belly (abdomen). Breathe in slowly through your nose for 1 second. Your hand on your belly should move out. Pucker your lips and breathe out slowly through your lips. Your hand on your belly should move in as you breathe out.  Controlled coughing Learn  and use controlled coughing to clear mucus from your lungs. Follow these steps: 1. Lean your head a little forward. 2. Breathe in deeply. 3. Try to hold your breath for 3 seconds. 4. Keep your mouth slightly open while coughing 2 times. 5. Spit any mucus out into a tissue. 6. Rest and do the steps again 1 or 2 times as needed. General instructions  Make sure you get all the shots (vaccines) that your doctor recommends. Ask your doctor about a flu shot and a pneumonia shot.  Use oxygen therapy and pulmonary rehabilitation if told by your doctor. If you need home oxygen therapy, ask your doctor if you should buy a tool to measure your oxygen level (oximeter).  Make a COPD action plan with your doctor. This helps you to know what to do if you feel worse than usual.  Manage any other conditions you have as told by your doctor.  Avoid going outside when it is very hot, cold, or humid.  Avoid people who have a sickness you can catch (contagious).  Keep all follow-up visits as told by your doctor. This is important. Contact a doctor if:  You cough up more mucus than usual.  There is a change in the color or thickness of the mucus.  It is harder to breathe than usual.  Your breathing is faster than usual.  You have trouble sleeping.  You need to use your medicines more often than usual.  You have trouble doing your normal activities such as getting dressed   or walking around the house. Get help right away if:  You have shortness of breath while resting.  You have shortness of breath that stops you from: ? Being able to talk. ? Doing normal activities.  Your chest hurts for longer than 5 minutes.  Your skin color is more blue than usual.  Your pulse oximeter shows that you have low oxygen for longer than 5 minutes.  You have a fever.  You feel too tired to breathe normally. Summary  Chronic obstructive pulmonary disease (COPD) is a long-term lung problem.  The way your  lungs work will never return to normal. Usually the condition gets worse over time. There are things you can do to keep yourself as healthy as possible.  Take over-the-counter and prescription medicines only as told by your doctor.  If you smoke, stop. Smoking makes the problem worse. This information is not intended to replace advice given to you by your health care provider. Make sure you discuss any questions you have with your health care provider. Document Revised: 02/16/2017 Document Reviewed: 04/10/2016 Elsevier Patient Education  [place].  COPD and Physical Activity Chronic obstructive pulmonary disease (COPD) is a long-term (chronic) condition that affects the lungs. COPD is a general term that can be used to describe many different lung problems that cause lung swelling (inflammation) and limit airflow, including chronic bronchitis and emphysema. The main symptom of COPD is shortness of breath, which makes it harder to do even simple tasks. This can also make it harder to exercise and be active. Talk with your health care provider about treatments to help you breathe better and actions you can take to prevent breathing problems during physical activity. What are the benefits of exercising with COPD? Exercising regularly is an important part of a healthy lifestyle. You can still exercise and do physical activities even though you have COPD. Exercise and physical activity improve your shortness of breath by increasing blood flow (circulation). This causes your heart to pump more oxygen through your body. Moderate exercise can improve your:  Oxygen use.  Energy level.  Shortness of breath.  Strength in your breathing muscles.  Heart health.  Sleep.  Self-esteem and feelings of self-worth.  Depression, stress, and anxiety levels. Exercise can benefit everyone with COPD. The severity of your disease may affect how hard you can exercise, especially at first, but everyone  can benefit. Talk with your health care provider about how much exercise is safe for you, and which activities and exercises are safe for you. What actions can I take to prevent breathing problems during physical activity?  Sign up for a pulmonary rehabilitation program. This type of program may include: ? Education about lung diseases. ? Exercise classes that teach you how to exercise and be more active while improving your breathing. This usually involves:  Exercise using your lower extremities, such as a stationary bicycle.  About 30 minutes of exercise, 2 to 5 times per week, for 6 to 12 weeks  Strength training, such as push ups or leg lifts. ? Nutrition education. ? Group classes in which you can talk with others who also have COPD and learn ways to manage stress.  If you use an oxygen tank, you should use it while you exercise. Work with your health care provider to adjust your oxygen for your physical activity. Your resting flow rate is different from your flow rate during physical activity.  While you are exercising: ? Take slow breaths. ? [employer]  and do not try to go too fast. ? Purse your lips while breathing out. Pursing your lips is similar to a kissing or whistling position. ? If doing exercise that uses a quick burst of effort, such as weight lifting:  Breathe in before starting the exercise.  Breathe out during the hardest part of the exercise (such as raising the weights). Where to find support You can find support for exercising with COPD from:  Your health care provider.  A pulmonary rehabilitation program.  Your local health department or community health programs.  Support groups, online or in-person. Your health care provider may be able to recommend support groups. Where to find more information You can find more information about exercising with COPD from:  American Lung Association: [employer].se.  COPD Foundation: https://www.rivera.net/. Contact a  health care provider if:  Your symptoms get worse.  You have chest pain.  You have nausea.  You have a fever.  You have trouble talking or catching your breath.  You want to start a new exercise program or a new activity. Summary  COPD is a general term that can be used to describe many different lung problems that cause lung swelling (inflammation) and limit airflow. This includes chronic bronchitis and emphysema.  Exercise and physical activity improve your shortness of breath by increasing blood flow (circulation). This causes your heart to provide more oxygen to your body.  Contact your health care provider before starting any exercise program or new activity. Ask your health care provider what exercises and activities are safe for you. This information is not intended to replace advice given to you by your health care provider. Make sure you discuss any questions you have with your health care provider. Document Revised: 06/26/2018 Document Reviewed: 03/29/2017 Elsevier Patient Education  [place].  Diabetes Mellitus and Nutrition, Adult When you have diabetes (diabetes mellitus), it is very important to have healthy eating habits because your blood sugar (glucose) levels are greatly affected by what you eat and drink. Eating healthy foods in the appropriate amounts, at about the same times every day, can help you:  Control your blood glucose.  Lower your risk of heart disease.  Improve your blood pressure.  Reach or maintain a healthy weight. Every person with diabetes is different, and each person has different needs for a meal plan. Your health care provider may recommend that you work with a diet and nutrition specialist (dietitian) to make a meal plan that is best for you. Your meal plan may vary depending on factors such as:  The calories you need.  The medicines you take.  Your weight.  Your blood glucose, blood pressure, and cholesterol levels.  Your  activity level.  Other health conditions you have, such as heart or kidney disease. How do carbohydrates affect me? Carbohydrates, also called carbs, affect your blood glucose level more than any other type of food. Eating carbs naturally raises the amount of glucose in your blood. Carb counting is a method for keeping track of how many carbs you eat. Counting carbs is important to keep your blood glucose at a healthy level, especially if you use insulin or take certain oral diabetes medicines. It is important to know how many carbs you can safely have in each meal. This is different for every person. Your dietitian can help you calculate how many carbs you should have at each meal and for each snack. Foods that contain carbs include:  Bread, cereal, rice, pasta, and crackers.  Potatoes and corn.  Peas, beans, and lentils.  Milk and yogurt.  Fruit and juice.  Desserts, such as cakes, cookies, ice cream, and candy. How does alcohol affect me? Alcohol can cause a sudden decrease in blood glucose (hypoglycemia), especially if you use insulin or take certain oral diabetes medicines. Hypoglycemia can be a life-threatening condition. Symptoms of hypoglycemia (sleepiness, dizziness, and confusion) are similar to symptoms of having too much alcohol. If your health care provider says that alcohol is safe for you, follow these guidelines:  Limit alcohol intake to no more than 1 drink per day for nonpregnant women and 2 drinks per day for men. One drink equals 12 oz of beer, 5 oz of wine, or 1 oz of hard liquor.  Do not drink on an empty stomach.  Keep yourself hydrated with water, diet soda, or unsweetened iced tea.  Keep in mind that regular soda, juice, and other mixers may contain a lot of sugar and must be counted as carbs. What are tips for following this plan?  Reading food labels  Start by checking the serving size on the "Nutrition Facts" label of packaged foods and drinks. The  amount of calories, carbs, fats, and other nutrients listed on the label is based on one serving of the item. Many items contain more than one serving per package.  Check the total grams (g) of carbs in one serving. You can calculate the number of servings of carbs in one serving by dividing the total carbs by 15. For example, if a food has 30 g of total carbs, it would be equal to 2 servings of carbs.  Check the number of grams (g) of saturated and trans fats in one serving. Choose foods that have low or no amount of these fats.  Check the number of milligrams (mg) of salt (sodium) in one serving. Most people should limit total sodium intake to less than 2,300 mg per day.  Always check the nutrition information of foods labeled as "low-fat" or "nonfat". These foods may be higher in added sugar or refined carbs and should be avoided.  Talk to your dietitian to identify your daily goals for nutrients listed on the label. Shopping  Avoid buying canned, premade, or processed foods. These foods tend to be high in fat, sodium, and added sugar.  Shop around the outside edge of the grocery store. This includes fresh fruits and vegetables, bulk grains, fresh meats, and fresh dairy. Cooking  Use low-heat cooking methods, such as baking, instead of high-heat cooking methods like deep frying.  Cook using healthy oils, such as olive, canola, or sunflower oil.  Avoid cooking with butter, cream, or high-fat meats. Meal planning  Eat meals and snacks regularly, preferably at the same times every day. Avoid going long periods of time without eating.  Eat foods high in fiber, such as fresh fruits, vegetables, beans, and whole grains. Talk to your dietitian about how many servings of carbs you can eat at each meal.  Eat 4-6 ounces (oz) of lean protein each day, such as lean meat, chicken, fish, eggs, or tofu. One oz of lean protein is equal to: ? 1 oz of meat, chicken, or fish. ? 1 egg. ?  cup of  tofu.  Eat some foods each day that contain healthy fats, such as avocado, nuts, seeds, and fish. Lifestyle  Check your blood glucose regularly.  Exercise regularly as told by your health care provider. This may include: ? 150 minutes of moderate-intensity or  vigorous-intensity exercise each week. This could be brisk walking, biking, or water aerobics. ? Stretching and doing strength exercises, such as yoga or weightlifting, at least 2 times a week.  Take medicines as told by your health care provider.  Do not use any products that contain nicotine or tobacco, such as cigarettes and e-cigarettes. If you need help quitting, ask your health care provider.  Work with a Social worker or diabetes educator to identify strategies to manage stress and any emotional and social challenges. Questions to ask a health care provider  Do I need to meet with a diabetes educator?  Do I need to meet with a dietitian?  What number can I call if I have questions?  When are the best times to check my blood glucose? Where to find more information:  American Diabetes Association: diabetes.org  Academy of Nutrition and Dietetics: www.eatright.[employer] of Diabetes and Digestive and Kidney Diseases (NIH): [employer] Summary  A healthy meal plan will help you control your blood glucose and maintain a healthy lifestyle.  Working with a diet and nutrition specialist (dietitian) can help you make a meal plan that is best for you.  Keep in mind that carbohydrates (carbs) and alcohol have immediate effects on your blood glucose levels. It is important to count carbs and to use alcohol carefully. This information is not intended to replace advice given to you by your health care provider. Make sure you discuss any questions you have with your health care provider. Document Revised: 02/16/2017 Document Reviewed: 04/10/2016 Elsevier Patient Education  2020 [employer].

## 2019-05-21 NOTE — Progress Notes (Signed)
Subjective:    Patient ID: [name], female    DOB: Aug 18, 1955, 64 y.o.   MRN: [id number]  No chief complaint on file.   HPI Pt is a 64 yo female with pmh sig for HTN, DM II, R adrenal mass, h/o b/l DVT on coumadin, COPD, diastolic CHF, OSA on CPAP, GERD, arthritis, constipation, h/o thyroid mass, h/o depression.    Pt was seen today for f/u.  Pt notes increased fatigue.  Unsure of blood sugar as has not been checking.  Endorses decreased appetite.  Mostly eating unsweetened applesauce throughout the day.  Denies increased thirst, hunger, or urinary frequency.  Pt inquires about stopping metformin.  Pt taking Anoro inhaler for COPD.  States does not have a pulmonologist.  Notes SOB if up moving around a lot.  Using CPAP at night for OSA.  Denies LE edema.  Pt seen by Dr. Marlou Sa, Ortho for back pain.  States had MRI, was advised to get spinal injections, but is hesitant 2/2 having pain in legs x 2 days after previous injection.  Also followed by Nephrology for R adrenal mass.    Past Medical History:  Diagnosis Date  . Adrenal mass ([place]) 03/226   Benign  . Arthritis    knees/multiple orthopedic conditons; lower back  . Asthma    per pt  . 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 Depression   . Diabetes mellitus    120s usually fasting -  dx more than 10 yrs ago  . Dizziness, nonspecific   . DVT (deep venous thrombosis) (HCC)    Recurrent  . Fibromyalgia   . GERD (gastroesophageal reflux disease)   . Heart murmur   . History of blood transfusion   . History of cocaine abuse ([place])    Remote history   . Hypertension    takes meds daily  . Hypothyroidism   . Lupus Anticoagulant Positive   . On home oxygen therapy    at night  . Shortness of breath    exertion or lying flat  . Sleep apnea    2l of oxygen at night (as of 12/6, she used to)    Allergies   Allergen Reactions  . Lisinopril Swelling    THROAT SWELLING Pt states her throat started to "close up" after being on it for "so long."  . Corticosteroids Rash    States she developed a rash on her tongue after a steroid injection in her bac    ROS General: Denies fever, chills, night sweats, changes in weight, changes in appetite  +fatigue HEENT: Denies headaches, ear pain, changes in vision, rhinorrhea, sore throat CV: Denies CP, palpitations, orthopnea  +SOB Pulm: Denies cough, wheezing  +SOB GI: Denies abdominal pain, nausea, vomiting, diarrhea, constipation GU: Denies dysuria, hematuria, frequency, vaginal discharge Msk: Denies muscle cramps, joint pains  +back pain Neuro: Denies weakness, numbness, tingling Skin: Denies rashes, bruising Psych: Denies depression, anxiety, hallucinations      Objective:    Blood pressure 110/78, pulse 96, temperature 97.8 F (36.6 C), temperature source Temporal, weight 278 lb (126.1 kg), SpO2 97 %.   Gen. Pleasant, well-nourished, obese, in no distress, normal affect   HEENT: [place]/AT, face symmetric, no scleral icterus, PERRLA, EOMI, nares patent without drainage Lungs: no accessory muscle use, CTAB, no wheezes or rales Cardiovascular: RRR, no m/r/g, no peripheral edema Musculoskeletal: No deformities, no  cyanosis or clubbing, normal tone Neuro:  A&Ox3, CN II-XII intact.  Using a cane to aid with ambulation Skin:  Warm, no lesions/ rash   Wt Readings from Last 3 Encounters:  05/21/19 278 lb (126.1 kg)  04/28/19 276 lb 1.6 oz (125.2 kg)  04/23/19 273 lb (123.8 kg)    Lab Results  Component Value Date   WBC 9.9 03/16/2019   HGB 10.9 (L) 03/16/2019   HCT 34.7 (L) 03/16/2019   PLT 308 03/16/2019   GLUCOSE 167 (H) 03/16/2019   CHOL 230 (H) 04/24/2014   TRIG 101 04/24/2014   HDL 82 04/24/2014   LDLCALC 128 (H) 04/24/2014   ALT 50 (H) 03/15/2019   AST 41 03/15/2019   NA 136 03/16/2019   K 3.5 03/16/2019   CL 96 (L) 03/16/2019    CREATININE 0.67 03/16/2019   BUN 17 03/16/2019   CO2 24 03/16/2019   TSH 0.855 08/18/2013   INR 2.5 (H) 04/28/2019   HGBA1C 8.5 (H) 03/09/2019    Assessment/Plan:  OSA on CPAP -continue CPAP  Chronic obstructive pulmonary disease, unspecified COPD type ([place]) -increased SOB. -discussed CXR.  Pt declined this visit.  Wishes to wait until next OFV as she has someone waiting on her in the car -continue albuterol inhaler prn, Anoro ellipta 62.5-25  - Plan: Ambulatory referral to Pulmonology, DG Chest 2 View  Essential hypertension -controlled -continue norvasc 5 mg, clonidine 0.1 mg, HCTZ 12.5 mg, cartia xt 240 mg -ACEI caused anaphylaxis  History of DVT (deep vein thrombosis) -continue coumadin 5 mg on S, T, Th, Sa -last INR 2.5 on 04/28/19  Controlled type 2 diabetes mellitus with complication, without long-term current use of insulin (HCC)  -Hgb A1C 7.2% this visit -fsbs 152 -discussed lifestyle modifications -continue metformin 500 mg and invokana 100 mg -given handout - Plan: POCT Glucose (Device for Home Use), POCT glycosylated hemoglobin (Hb A1C)  Fatigue -likely multi factorial.  Deconditioning, COPD, possible hyperglycemia -Hgb A1C 7.2% -Consider PT -Will obtain labs at next OFV as pt wishes to wait.  F/u in 1 month  [name], MD

## 2019-05-22 NOTE — Progress Notes (Signed)
Subjective: [name] presents today for follow up of preventative diabetic foot care and painful mycotic nails b/l that are difficult to trim. Pain interferes with ambulation. Aggravating factors include wearing enclosed shoe gear. Pain is relieved with periodic professional debridement.   Allergies  Allergen Reactions  . Lisinopril Swelling    THROAT SWELLING Pt states her throat started to "close up" after being on it for "so long."  . Corticosteroids Rash    States she developed a rash on her tongue after a steroid injection in her bac     Objective: Vitals:   05/20/19 0839  Temp: (!) 97.3 F (36.3 C)    Vascular Examination:  Capillary fill time to digits <3s b/l. Palpable DP pulses b/l. Palpable PT pulses b/l. Pedal hair absent b/l Skin temperature gradient within normal limits b/l.  Dermatological Examination: Pedal skin with normal turgor, texture and tone bilaterally. No open wounds bilaterally. No interdigital macerations bilaterally. Toenails 2-5 b/l elongated, dystrophic, thickened, crumbly with subungual debris and tenderness to dorsal palpation.   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   There is a small amount of red granulation tissue noted at proximal medial corner of left hallux. There is a small area of hyperpigmentation noted at the proximal nailfold of this area. No purulence expressed.  Musculoskeletal: Normal muscle strength 5/5 to all lower extremity muscle groups bilaterally, no gross bony deformities bilaterally and no pain crepitus or joint limitation noted with ROM b/l  Neurological: Protective sensation absent with 10g monofilament b/l  Assessment: 1. Pain due to onychomycosis of toenails of both feet   2. Ingrown nail    Plan: -Continue diabetic foot care principles. Literature dispensed on today.  -Toenails 2-5 b/l were debrided in length and girth with sterile nail nippers and dremel without iatrogenic bleeding.   -Proximal medial corner of left hallux gently debrided of granulation tissue. Curretaged border and irrigated with alcohol. Triple antibiotic ointment and band-aid applied. Prescribed once daily epsom salt soaks and follow up with Dr.Regal to evaluate medial  border left hallux. -Patient to continue soft, supportive shoe gear daily. -Patient to report any pedal injuries to medical professional immediately. -Patient/POA to call should there be question/concern in the interim.  Return in about 1 week (around 05/27/2019) for left nail check with Dr Paulla Dolly.

## 2019-05-23 ENCOUNTER — Other Ambulatory Visit: Payer: Self-pay

## 2019-05-26 ENCOUNTER — Telehealth: Payer: Self-pay | Admitting: *Deleted

## 2019-05-26 ENCOUNTER — Inpatient Hospital Stay: Payer: Medicare Other | Attending: Oncology

## 2019-05-26 ENCOUNTER — Other Ambulatory Visit: Payer: Self-pay

## 2019-05-26 DIAGNOSIS — Z86718 Personal history of other venous thrombosis and embolism: Secondary | ICD-10-CM | POA: Diagnosis not present

## 2019-05-26 DIAGNOSIS — Z7901 Long term (current) use of anticoagulants: Secondary | ICD-10-CM | POA: Insufficient documentation

## 2019-05-26 DIAGNOSIS — I82403 Acute embolism and thrombosis of unspecified deep veins of lower extremity, bilateral: Secondary | ICD-10-CM

## 2019-05-26 LAB — PROTIME-INR
INR: 3.9 — ABNORMAL HIGH (ref 0.8–1.2)
Prothrombin Time: 38.6 seconds — ABNORMAL HIGH (ref 11.4–15.2)

## 2019-05-26 NOTE — Telephone Encounter (Signed)
Notified of elevated INR 3.9. She denies any bleeding or new medications/no antibiotics. Confirmed she is on 5 mg daily, except 10 mg MWF. Instructed her to hold coumadin and recheck on 05/29/19. Appointment scheduled with her.

## 2019-05-28 ENCOUNTER — Ambulatory Visit: Payer: Medicare Other | Admitting: Podiatry

## 2019-05-29 ENCOUNTER — Inpatient Hospital Stay: Payer: Medicare Other

## 2019-05-29 ENCOUNTER — Telehealth: Payer: Self-pay | Admitting: *Deleted

## 2019-05-29 ENCOUNTER — Other Ambulatory Visit: Payer: Self-pay

## 2019-05-29 ENCOUNTER — Telehealth: Payer: Self-pay | Admitting: Oncology

## 2019-05-29 DIAGNOSIS — I82403 Acute embolism and thrombosis of unspecified deep veins of lower extremity, bilateral: Secondary | ICD-10-CM

## 2019-05-29 DIAGNOSIS — Z7901 Long term (current) use of anticoagulants: Secondary | ICD-10-CM

## 2019-05-29 DIAGNOSIS — Z86718 Personal history of other venous thrombosis and embolism: Secondary | ICD-10-CM | POA: Diagnosis not present

## 2019-05-29 LAB — PROTIME-INR
INR: 1.5 — ABNORMAL HIGH (ref 0.8–1.2)
Prothrombin Time: 17.9 seconds — ABNORMAL HIGH (ref 11.4–15.2)

## 2019-05-29 NOTE — Telephone Encounter (Signed)
Scheduled appt per 3/11 sch message - pt aware of appt date  And time

## 2019-05-29 NOTE — Telephone Encounter (Signed)
-----   Message from Ladell Pier, MD sent at 05/29/2019  1:32 PM EST ----- Please call patient, the PT/INR is better, resume Coumadin at previous dose, repeat PT/INR in 2 weeks

## 2019-05-29 NOTE — Telephone Encounter (Signed)
Notified of message below. Verbalized understanding.   Message to scheduler.  

## 2019-06-11 ENCOUNTER — Ambulatory Visit: Payer: Medicare Other | Admitting: Podiatry

## 2019-06-12 ENCOUNTER — Inpatient Hospital Stay: Payer: Medicare Other

## 2019-06-12 ENCOUNTER — Other Ambulatory Visit: Payer: Self-pay

## 2019-06-12 DIAGNOSIS — Z7901 Long term (current) use of anticoagulants: Secondary | ICD-10-CM

## 2019-06-12 DIAGNOSIS — Z86718 Personal history of other venous thrombosis and embolism: Secondary | ICD-10-CM | POA: Diagnosis not present

## 2019-06-12 LAB — PROTIME-INR
INR: 2.2 — ABNORMAL HIGH (ref 0.8–1.2)
Prothrombin Time: 24 seconds — ABNORMAL HIGH (ref 11.4–15.2)

## 2019-06-13 ENCOUNTER — Telehealth: Payer: Self-pay

## 2019-06-13 ENCOUNTER — Other Ambulatory Visit: Payer: Self-pay

## 2019-06-13 MED ORDER — WARFARIN SODIUM 5 MG PO TABS: ORAL_TABLET | ORAL | 1 refills | Status: DC

## 2019-06-13 NOTE — Telephone Encounter (Signed)
Called patient and let her know Dr. Gearldine Shown instructions below. Patient verbalized understanding and requested a refill on Coumadin 5mg . Dr. Benay Spice gave ok to send in refill. Scheduling message sent to schedule patient for a lab appointment in 1 month.

## 2019-06-13 NOTE — Telephone Encounter (Signed)
-----   Message from Ladell Pier, MD sent at 06/12/2019  8:30 PM EDT ----- Please call patient, PT INR is therapeutic, continue coumadin same dose, repeat PT INR 1 month

## 2019-06-16 ENCOUNTER — Telehealth: Payer: Self-pay | Admitting: Nurse Practitioner

## 2019-06-16 NOTE — Telephone Encounter (Signed)
Scheduled appt per 3/26 sch message  - pt aware of appt

## 2019-06-30 ENCOUNTER — Ambulatory Visit: Payer: Medicare Other | Attending: Internal Medicine

## 2019-06-30 DIAGNOSIS — Z23 Encounter for immunization: Secondary | ICD-10-CM

## 2019-06-30 NOTE — Progress Notes (Signed)
   Covid-19 Vaccination Clinic  Name:  [name]    MRN: [id number] DOB: October 18, 1955  06/30/2019  [name] was observed post Covid-19 immunization for 15 minutes without incident. She was provided with Vaccine Information Sheet and instruction to access the V-Safe system.   [name] was instructed to call 911 with any severe reactions post vaccine: Marland Kitchen Difficulty breathing  . Swelling of face and throat  . A fast heartbeat  . A bad rash all over body  . Dizziness and weakness   Immunizations Administered    Name Date Dose VIS Date Route   Pfizer COVID-19 Vaccine 06/30/2019 12:18 PM 0.3 mL 02/28/2019 Intramuscular   Manufacturer: [place]   Lot: [id number]   [place]: [id number]

## 2019-07-09 ENCOUNTER — Encounter: Payer: Self-pay | Admitting: Pulmonary Disease

## 2019-07-09 ENCOUNTER — Ambulatory Visit (INDEPENDENT_AMBULATORY_CARE_PROVIDER_SITE_OTHER): Payer: Medicare Other

## 2019-07-09 ENCOUNTER — Other Ambulatory Visit: Payer: Self-pay

## 2019-07-09 ENCOUNTER — Ambulatory Visit (INDEPENDENT_AMBULATORY_CARE_PROVIDER_SITE_OTHER): Payer: Medicare Other | Admitting: Pulmonary Disease

## 2019-07-09 VITALS — BP 118/68 | HR 90 | Temp 98.0°F | Ht 66.0 in | Wt 279.0 lb

## 2019-07-09 DIAGNOSIS — J441 Chronic obstructive pulmonary disease with (acute) exacerbation: Secondary | ICD-10-CM

## 2019-07-09 LAB — BRAIN NATRIURETIC PEPTIDE: Pro B Natriuretic peptide (BNP): 18 pg/mL (ref 0.0–100.0)

## 2019-07-09 NOTE — Progress Notes (Signed)
[name]    [id number]    25-Jun-1955  Primary Care Physician:Banks, Langley Adie, MD  Referring Physician: Billie Ruddy, MD [place],  [place] [address]  Chief complaint: Consult for dyspnea, COPD, asthma, post COVID-51  HPI: 64 year old with history of clotting disorder and recurrent DVT on Coumadin, lupus, OSA on CPAP, right adrenal mass, diabetes  Her chart says that she has COPD, asthma and is a smoker but no PFTs on record and patient claims that she has never smoked but had used cocaine in the past.  She follows with [facility].  Patient tells me that she is on some kind of a steroid inhaler but does not know which one.  Her med list lists only Jearl Klinefelter She was hospitalized in December for COVID-19 infection, presumed COPD exacerbation treated with remdesivir.  Chief complaint is chronic dyspnea on exertion.  Her symptoms have worsened after her hospitalization for COVID-19. Also has lower extremity edema, orthopnea.  Pets: No pets Occupation: Used to work in Teacher, adult education, [employer], [employer], Materials engineer.  Currently on disability Exposures: No known exposures.  No mold, hot tub, Jacuzzi Smoking history: Has not smoked cigarettes.  Used to smoke cocaine from 2003-2006 Travel history: Previously lived in [place] for 10 years in [place] for a year.  No significant recent travel Relevant family history: Strong family history of asthma with multiple family members.  Outpatient Encounter Medications as of 07/09/2019  Medication Sig  . albuterol (PROVENTIL HFA;VENTOLIN HFA) 108 (90 BASE) MCG/ACT inhaler Inhale 2 puffs into the lungs every 4 (four) hours as needed for shortness of breath.  Marland Kitchen albuterol (PROVENTIL) (2.5 MG/3ML) 0.083% nebulizer solution Take 2.5 mg by nebulization every 6 (six) hours as needed for wheezing or shortness of breath.  Marland Kitchen amLODipine (NORVASC) 5 MG tablet Take 5 mg by mouth daily.  Jearl Klinefelter ELLIPTA  62.5-25 MCG/INH AEPB Inhale 1 puff into the lungs daily.   . benzonatate (TESSALON) 100 MG capsule Take 100 mg by mouth 3 (three) times daily as needed.  . canagliflozin (INVOKANA) 100 MG TABS tablet Take 100 mg by mouth daily.  Marland Kitchen CARTIA XT 240 MG 24 hr capsule Take 240 mg by mouth daily.  . cloNIDine (CATAPRES) 0.1 MG tablet Take 0.1 mg by mouth 2 (two) times daily.  . diclofenac sodium (VOLTAREN) 1 % GEL Apply 2 g topically daily as needed (fibromyalgia pain).  . DULoxetine (CYMBALTA) 20 MG capsule Take 20 mg by mouth daily.  . DULoxetine (CYMBALTA) 60 MG capsule Take 60 mg by mouth at bedtime.   . fluticasone (FLONASE) 50 MCG/ACT nasal spray Place 1 spray into both nostrils daily.   Marland Kitchen gabapentin (NEURONTIN) 300 MG capsule Take 300 mg by mouth at bedtime.   . hydrochlorothiazide (HYDRODIURIL) 12.5 MG tablet Take 12.5 mg by mouth daily.   Marland Kitchen levocetirizine (XYZAL) 5 MG tablet Take 5 mg by mouth at bedtime.  Marland Kitchen LINZESS 145 MCG CAPS capsule Take 145 mcg by mouth every other day.   . meclizine (ANTIVERT) 25 MG tablet Take 1 tablet (25 mg total) by mouth 3 (three) times daily as needed for dizziness.  . metFORMIN (GLUCOPHAGE) 500 MG tablet Take 500 mg by mouth at bedtime.  . metoCLOPramide (REGLAN) 10 MG tablet Take 1 tablet (10 mg total) by mouth every 6 (six) hours as needed for nausea (or headache).  Marland Kitchen omeprazole (PRILOSEC) 20 MG capsule Take 20 mg by  mouth 2 (two) times daily.  . pravastatin (PRAVACHOL) 40 MG tablet Take 40 mg by mouth every evening.   Marland Kitchen QUEtiapine (SEROQUEL) 50 MG tablet Take 50 mg by mouth at bedtime.   Marland Kitchen REXULTI 2 MG TABS tablet Take 2 mg by mouth daily.  Marland Kitchen tiZANidine (ZANAFLEX) 4 MG tablet Take 4 mg by mouth 2 (two) times daily as needed for muscle spasms.  Marland Kitchen warfarin (COUMADIN) 5 MG tablet TAKE 1 TABLET BY MOUTH DAILY EXCEPT ON MONDAY, WEDNESDAY, AND FRIDAY OR AS DIRECTED. (Patient taking differently: Patient takes 10mg  Monday, Wednesday, Friday, and 5mg  on Tuesday,  Thursday, Saturday and Sunday.)  . zonisamide (ZONEGRAN) 50 MG capsule Take 50 mg by mouth 2 (two) times a day.   No facility-administered encounter medications on file as of 07/09/2019.    Allergies as of 07/09/2019 - Review Complete 07/09/2019  Allergen Reaction Noted  . Lisinopril Swelling 12/14/2014  . Corticosteroids Rash 01/03/2012    Past Medical History:  Diagnosis Date  . Adrenal mass ([place]) 03/226   Benign  . Arthritis    knees/multiple orthopedic conditons; lower back  . Asthma    per pt  . 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 Depression   . Diabetes mellitus    120s usually fasting -  dx more than 10 yrs ago  . Dizziness, nonspecific   . DVT (deep venous thrombosis) (HCC)    Recurrent  . Fibromyalgia   . GERD (gastroesophageal reflux disease)   . Heart murmur   . History of blood transfusion   . History of cocaine abuse ([place])    Remote history   . Hypertension    takes meds daily  . Hypothyroidism   . Lupus Anticoagulant Positive   . On home oxygen therapy    at night  . Shortness of breath    exertion or lying flat  . Sleep apnea    2l of oxygen at night (as of 12/6, she used to)    Past Surgical History:  Procedure Laterality Date  . ABDOMINAL HYSTERECTOMY  1989   Fibroids  . ANTERIOR LUMBAR FUSION  01/03/2012   Procedure: ANTERIOR LUMBAR FUSION 1 LEVEL;  Surgeon: Winfield Cunas, MD;  Location: [facility];  Service: Neurosurgery;  Laterality: N/A;  Lumbar Four-Five Anterior Lumbar Interbody Fusion with Instrumentation  . BACK SURGERY     cervical spine---disk disease  . CARPAL TUNNEL RELEASE     Bilateral  . CESAREAN SECTION    . CHOLECYSTECTOMY    . CYSTOSCOPY/RETROGRADE/URETEROSCOPY/STONE EXTRACTION WITH BASKET Right 04/26/2014   Procedure: CYSTOSCOPY/ RIGHT RETROGRADE/ RIGHT URETEROSCOPY/URETERAL AND RENAL PELVIS BIOPSY;   Surgeon: Alexis Frock, MD;  Location: [facility];  Service: Urology;  Laterality: Right;  . gallstones removed    . 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   bilateral  . right elbow surgery    . THYROID LOBECTOMY Right 02/27/2017  . THYROID LOBECTOMY Right 02/27/2017   Procedure: RIGHT THYROID LOBECTOMY;  Surgeon: Erroll Luna, MD;  Location: [facility];  Service: General;  Laterality: Right;  . TUBAL LIGATION      Family History  Problem Relation Age of Onset  . Hypertension Mother   . Diabetes Mother   . Cancer Mother        Pancreatic  . Hypertension Father   . Heart failure Father     Social History  Socioeconomic History  . Marital status: Divorced    Spouse name: Not on file  . Number of children: Not on file  . Years of education: Not on file  . Highest education level: Not on file  Occupational History  . Not on file  Tobacco Use  . Smoking status: Never Smoker  . Smokeless tobacco: Never Used  Substance and Sexual Activity  . Alcohol use: No    Alcohol/week: 0.0 standard drinks    Comment: beer in past   . Drug use: Yes    Types: Cocaine    Comment: quit 2003-rehab progam in 2005  . Sexual activity: Never  Other Topics Concern  . Not on file  Social History Narrative   Lives alone in apartment, drives   Social Determinants of Health   Financial Resource Strain:   . Difficulty of Paying Living Expenses:   Food Insecurity:   . Worried About Charity fundraiser in the Last Year:   . Arboriculturist in the Last Year:   Transportation Needs:   . Film/video editor (Medical):   Marland Kitchen Lack of Transportation (Non-Medical):   Physical Activity:   . Days of Exercise per Week:   . Minutes of Exercise per Session:   Stress:   . Feeling of Stress :   Social Connections:   . Frequency of Communication with Friends and Family:   . Frequency of Social Gatherings with Friends and Family:   . Attends Religious Services:   . Active Member of Clubs or Organizations:    . Attends Archivist Meetings:   Marland Kitchen Marital Status:   Intimate Partner Violence:   . Fear of Current or Ex-Partner:   . Emotionally Abused:   Marland Kitchen Physically Abused:   . Sexually Abused:     Review of systems: Review of Systems  Constitutional: Negative for fever and chills.  HENT: Negative.   Eyes: Negative for blurred vision.  Respiratory: as per HPI  Cardiovascular: Negative for chest pain and palpitations.  Gastrointestinal: Negative for vomiting, diarrhea, blood per rectum. Genitourinary: Negative for dysuria, urgency, frequency and hematuria.  Musculoskeletal: Negative for myalgias, back pain and joint pain.  Skin: Negative for itching and rash.  Neurological: Negative for dizziness, tremors, focal weakness, seizures and loss of consciousness.  Endo/Heme/Allergies: Negative for environmental allergies.  Psychiatric/Behavioral: Negative for depression, suicidal ideas and hallucinations.  All other systems reviewed and are negative.  Physical Exam: Blood pressure 118/68, pulse 90, temperature 98 F (36.7 C), temperature source Temporal, height 5\' 6"  (1.676 m), weight 279 lb (126.6 kg), SpO2 94 %. Gen:      No acute distress, obese HEENT:  EOMI, sclera anicteric Neck:     No masses; no thyromegaly Lungs:    Clear to auscultation bilaterally; normal respiratory effort CV:         Regular rate and rhythm; no murmurs Abd:      + bowel sounds; soft, non-tender; no palpable masses, no distension Ext:    1+ edema; adequate peripheral perfusion Skin:      Warm and dry; no rash Neuro: alert and oriented x 3 Psych: normal mood and affect  Data Reviewed: Imaging: Chest x-ray 12/19/2018-no acute abnormalities. Chest x-ray 03/09/2019-cardiomegaly, bilateral interstitial opacities consistent with pulmonary edema.  PFTs:  Labs:  Cardiac: Echocardiogram 02/20/2019-LVEF [id number], grade 1 diastolic dysfunction,  Assessment:  Evaluation for dyspnea Probably has underlying  asthma, diastolic heart failure.  Not sure if she really has COPD as she  does not have a smoking history.   Will need evaluation for post Covid pneumonitis/fibrosis her symptoms worsen after her hospitalization for COVID-19 pneumonia  We will get records from her allergist to see exactly what inhaler she is on.  Continue Anoro for now Check CBC differential, IgE, BNP, alpha-1 antitrypsin levels and phenotype Chest x-ray.  If abnormal then get CT chest  Plan/Recommendations: CBC, IgE, BNP, alpha-1 antitrypsin PFTs, chest x-ray Obtain medical records from allergy.  Marshell Garfinkel MD [facility] 07/09/2019, 9:49 AM  CC: Billie Ruddy, MD

## 2019-07-09 NOTE — Patient Instructions (Signed)
We will get labs today including CBC differential, IgE, BNP, alpha-1 antitrypsin levels and phenotype We will schedule you for pulmonary function test and a chest x-ray Based on these results we may need CT of your chest  Continue the inhalers for now.  Based on review of these tests we will make changes to your regimen Follow back in clinic in 1 to 2 months.

## 2019-07-09 NOTE — Addendum Note (Signed)
Addended by: Suzzanne Cloud E on: 07/09/2019 10:37 AM   Modules accepted: Orders

## 2019-07-10 LAB — CBC WITH DIFFERENTIAL/PLATELET
Basophils Absolute: 0.1 10*3/uL (ref 0.0–0.1)
Basophils Relative: 0.9 % (ref 0.0–3.0)
Eosinophils Absolute: 0.1 10*3/uL (ref 0.0–0.7)
Eosinophils Relative: 2 % (ref 0.0–5.0)
HCT: 36.2 % (ref 36.0–46.0)
Hemoglobin: 11.7 g/dL — ABNORMAL LOW (ref 12.0–15.0)
Lymphocytes Relative: 22.1 % (ref 12.0–46.0)
Lymphs Abs: 1.5 10*3/uL (ref 0.7–4.0)
MCHC: 32.4 g/dL (ref 30.0–36.0)
MCV: 84.8 fl (ref 78.0–100.0)
Monocytes Absolute: 0.4 10*3/uL (ref 0.1–1.0)
Monocytes Relative: 6.3 % (ref 3.0–12.0)
Neutro Abs: 4.7 10*3/uL (ref 1.4–7.7)
Neutrophils Relative %: 68.7 % (ref 43.0–77.0)
Platelets: 342 10*3/uL (ref 150.0–400.0)
RBC: 4.27 Mil/uL (ref 3.87–5.11)
RDW: 16.9 % — ABNORMAL HIGH (ref 11.5–15.5)
WBC: 6.8 10*3/uL (ref 4.0–10.5)

## 2019-07-11 ENCOUNTER — Other Ambulatory Visit: Payer: Self-pay | Admitting: *Deleted

## 2019-07-11 DIAGNOSIS — Z7901 Long term (current) use of anticoagulants: Secondary | ICD-10-CM

## 2019-07-14 ENCOUNTER — Inpatient Hospital Stay: Payer: Medicare Other | Attending: Oncology

## 2019-07-14 ENCOUNTER — Ambulatory Visit (INDEPENDENT_AMBULATORY_CARE_PROVIDER_SITE_OTHER): Payer: Medicare Other | Admitting: Podiatry

## 2019-07-14 ENCOUNTER — Other Ambulatory Visit: Payer: Self-pay

## 2019-07-14 DIAGNOSIS — L600 Ingrowing nail: Secondary | ICD-10-CM | POA: Diagnosis not present

## 2019-07-14 DIAGNOSIS — Z86718 Personal history of other venous thrombosis and embolism: Secondary | ICD-10-CM | POA: Diagnosis not present

## 2019-07-14 DIAGNOSIS — Z7901 Long term (current) use of anticoagulants: Secondary | ICD-10-CM

## 2019-07-14 LAB — PROTIME-INR
INR: 2 — ABNORMAL HIGH (ref 0.8–1.2)
Prothrombin Time: 22.2 seconds — ABNORMAL HIGH (ref 11.4–15.2)

## 2019-07-15 ENCOUNTER — Other Ambulatory Visit: Payer: Self-pay | Admitting: *Deleted

## 2019-07-15 ENCOUNTER — Telehealth: Payer: Self-pay | Admitting: *Deleted

## 2019-07-15 DIAGNOSIS — Z7901 Long term (current) use of anticoagulants: Secondary | ICD-10-CM

## 2019-07-15 NOTE — Telephone Encounter (Signed)
-----   Message from Ladell Pier, MD sent at 07/14/2019  5:07 PM EDT ----- Please call patient, same Coumadin, check PT/INR in 1 month

## 2019-07-15 NOTE — Telephone Encounter (Signed)
Notified of INR. Continue warfarin 5 mg daily, except 10 mg MWF. Scheduled her 1 month lab w/her.

## 2019-07-16 ENCOUNTER — Telehealth (INDEPENDENT_AMBULATORY_CARE_PROVIDER_SITE_OTHER): Payer: Medicare Other | Admitting: Family Medicine

## 2019-07-16 DIAGNOSIS — E1169 Type 2 diabetes mellitus with other specified complication: Secondary | ICD-10-CM | POA: Diagnosis not present

## 2019-07-16 DIAGNOSIS — R0602 Shortness of breath: Secondary | ICD-10-CM

## 2019-07-16 DIAGNOSIS — R42 Dizziness and giddiness: Secondary | ICD-10-CM

## 2019-07-16 DIAGNOSIS — E669 Obesity, unspecified: Secondary | ICD-10-CM | POA: Diagnosis not present

## 2019-07-16 MED ORDER — BLOOD GLUCOSE MONITOR KIT: PACK | 0 refills | Status: DC

## 2019-07-16 NOTE — Progress Notes (Signed)
Virtual Visit via Telephone Note  I connected with [name] on 07/16/19 at  1:30 PM EDT by telephone and verified that I am speaking with the correct person using two identifiers.   I discussed the limitations, risks, security and privacy concerns of performing an evaluation and management service by telephone and the availability of in person appointments. I also discussed with the patient that there may be a patient responsible charge related to this service. The patient expressed understanding and agreed to proceed.  Location patient: home Location provider: work or home office Participants present for the call: patient, provider Patient did not have a visit in the prior 7 days to address this/these issue(s).   History of Present Illness: Pt is a 64 yo female with pmh sig for HTN, DVT anticoagulation, chronic diastolic CHF, OSA on CPAP, COPD, GERD, hypothyroidism, DM 2, arthritis seen for ongoing concern.  Pt gets dizzy with laying down.  Notices when bending over to pick up something.  Pt states this has been going on since she got out of the hospital with COVID on 03/09/19.  Pt has meclazine at home but has not tried it.  Pt also endorses SOB after walking 5 min.  Seen by Pulm on 07/09/19.  Pt does not know where her bp cuff is as her daughter, Levada Dy cleaned up the house.  She also needs a new glucometer.  Has not been checking fsbs.  Pt due for 2nd COVID shot on May 5.  Observations/Objective: Patient sounds cheerful and well on the phone. I do not appreciate any SOB. Speech and thought processing are grossly intact. Patient reported vitals:  Assessment and Plan: Diabetes mellitus type 2 in obese [facility]) -Discussed the importance of lifestyle modifications -Continue current meds -We will send in Rx for new glucometer.  Patient encouraged to keep a log of blood sugars - Plan: blood glucose meter kit and supplies KIT  Dizziness -Discussed possible causes including  vertigo, dehydration, hyperglycemia, elevated BP -encouraged to increase p.o. intake of water and fluids -encouraged to obtain a new blood pressure cuff and check BP regularly -Patient encouraged to use meclizine if needed.  Patient has prescription for this. -We will continue to monitor  Shortness of breath -Ongoing -Discussed possible causes including deconditioning/obesity hypoventilation, COPD exacerbation, PE-go on anticoagulation -Encouraged to keep follow-up with pulmonology for continued work-up -Discussed precautions   Follow Up Instructions:  F/u in person in 1 month, sooner if needed  I did not refer this patient for an OV in the next 24 hours for this/these issue(s).  I discussed the assessment and treatment plan with the patient. The patient was provided an opportunity to ask questions and all were answered. The patient agreed with the plan and demonstrated an understanding of the instructions.   The patient was advised to call back or seek an in-person evaluation if the symptoms worsen or if the condition fails to improve as anticipated.  I provided 17 minutes of non-face-to-face time during this encounter.   Billie Ruddy, MD

## 2019-07-16 NOTE — Progress Notes (Signed)
Unable to reach pt via caregility or via phone.  Will try again.

## 2019-07-17 ENCOUNTER — Other Ambulatory Visit: Payer: Self-pay | Admitting: Family Medicine

## 2019-07-17 ENCOUNTER — Telehealth: Payer: Self-pay | Admitting: Family Medicine

## 2019-07-17 LAB — ALPHA-1 ANTITRYPSIN PHENOTYPE: A-1 Antitrypsin, Ser: 126 mg/dL (ref 83–199)

## 2019-07-17 LAB — IGE: IgE (Immunoglobulin E), Serum: 12 kU/L (ref <=114)

## 2019-07-17 NOTE — Telephone Encounter (Signed)
Rx for Glucose meter kit sent refax to pt pharmacy

## 2019-07-17 NOTE — Telephone Encounter (Signed)
Pt advised that the pharmacy informed that they have not received order for blood glucose meter kit and supplies KIT.

## 2019-07-18 ENCOUNTER — Encounter: Payer: Self-pay | Admitting: Family Medicine

## 2019-07-18 NOTE — Progress Notes (Signed)
Subjective:   Patient ID: [name], female   DOB: 64 y.o.   MRN: [id number]   HPI Patient presents stating that she wanted to have this ingrown nail checked to make sure there was no indications of pathology   ROS      Objective:  Physical Exam  Neurovascular status intact with crusted nailbed that is localized but I did not note proximal edema erythema or drainage currently with only mild pain associated with it     Assessment:  Ingrown toenail that does have some crusted formed tissue but overall minimal indications of infective process with mild discomfort associated with it     Plan:  H&P reviewed condition and I went ahead using sterile instrumentation I debrided tissue and did not note active drainage.  I discussed soaks and padding and this should heal uneventfully and gave strict instructions of any redness were to occur to let us know immediately

## 2019-07-21 ENCOUNTER — Ambulatory Visit: Payer: Medicare Other | Attending: Internal Medicine

## 2019-07-21 DIAGNOSIS — Z23 Encounter for immunization: Secondary | ICD-10-CM

## 2019-07-21 NOTE — Progress Notes (Signed)
   Covid-19 Vaccination Clinic  Name:  [name]    MRN: [id number] DOB: 1955/05/29  07/21/2019  [name] was observed post Covid-19 immunization for 15 minutes without incident. She was provided with Vaccine Information Sheet and instruction to access the V-Safe system.   [name] was instructed to call 911 with any severe reactions post vaccine: Marland Kitchen Difficulty breathing  . Swelling of face and throat  . A fast heartbeat  . A bad rash all over body  . Dizziness and weakness   Immunizations Administered    Name Date Dose VIS Date Route   Pfizer COVID-19 Vaccine 07/21/2019  1:47 PM 0.3 mL 05/14/2018 Intramuscular   Manufacturer: [place]   Lot: [id number]   [place]: [id number]

## 2019-07-30 ENCOUNTER — Telehealth: Payer: Self-pay | Admitting: Family Medicine

## 2019-07-30 ENCOUNTER — Other Ambulatory Visit: Payer: Self-pay

## 2019-07-30 MED ORDER — CANAGLIFLOZIN 100 MG PO TABS: 100.0000 mg | ORAL_TABLET | Freq: Every day | ORAL | 0 refills | Status: DC

## 2019-07-30 NOTE — Telephone Encounter (Signed)
Spoke with pt state hat she was out of one of her Diabetes medication but could not recall the name, reviewed medications with pt and advised that she should be taking Metformin and Invokana for diabetes, pt state that she has not been taking the invokana for a week and could not remember the name of the medication, pt was offered an appointment with dr banks to f/u on her blood sugars but declined, pt prescription for Invokana sent to her pharmacy for refill.

## 2019-07-30 NOTE — Telephone Encounter (Signed)
Dr Volanda Napoleon was notified

## 2019-07-30 NOTE — Telephone Encounter (Signed)
Pt states that she is out of her blood sugar medication for a month now but does not remember the name. She states her blood sugar has been high for three days now. Pt is wondering if her PCP can send in a refill to her pharmacy.   Pharmacy: [facility] [place]: [phone]

## 2019-08-04 ENCOUNTER — Telehealth: Payer: Self-pay | Admitting: Family Medicine

## 2019-08-04 NOTE — Telephone Encounter (Signed)
Rashita with CoverMyMeds is requesting a prior authorization for Invokana 100 mg. The reference number is [id number]. Thanks

## 2019-08-05 NOTE — Telephone Encounter (Signed)
Pt Rx for Invokana sent to Prior Auth for-Key: [id number]

## 2019-08-06 ENCOUNTER — Telehealth: Payer: Self-pay | Admitting: Family Medicine

## 2019-08-06 NOTE — Telephone Encounter (Signed)
Taila from CoverMyMeds called in regards to a PA on a medication for this pt. Call dropped or she hung up but she needs a call back at [phone]

## 2019-08-06 NOTE — Telephone Encounter (Signed)
Spoke with an Agent from [employer] stated that the Prior Auth had some clinical questions that needed to be completed for processing, Questions completed and submitted. Pt is aware that we are waiting on Prior Auth approval.

## 2019-08-07 ENCOUNTER — Ambulatory Visit (INDEPENDENT_AMBULATORY_CARE_PROVIDER_SITE_OTHER): Payer: Medicare Other | Admitting: Podiatry

## 2019-08-07 ENCOUNTER — Encounter: Payer: Self-pay | Admitting: Podiatry

## 2019-08-07 ENCOUNTER — Other Ambulatory Visit: Payer: Self-pay

## 2019-08-07 VITALS — Temp 97.2°F

## 2019-08-07 DIAGNOSIS — E119 Type 2 diabetes mellitus without complications: Secondary | ICD-10-CM | POA: Diagnosis not present

## 2019-08-07 DIAGNOSIS — M79675 Pain in left toe(s): Secondary | ICD-10-CM

## 2019-08-07 DIAGNOSIS — B351 Tinea unguium: Secondary | ICD-10-CM

## 2019-08-07 DIAGNOSIS — S90112A Contusion of left great toe without damage to nail, initial encounter: Secondary | ICD-10-CM

## 2019-08-07 DIAGNOSIS — M79674 Pain in right toe(s): Secondary | ICD-10-CM

## 2019-08-07 NOTE — Progress Notes (Signed)
Subjective:   Patient ID: [name], female   DOB: 64 y.o.   MRN: [id number]   HPI Patient presents stating I traumatized my left big toe and its been bleeding a lot and I am a diabetic and I am concerned and also have nail disease of all nails   ROS      Objective:  Physical Exam  Neurovascular status unchanged with patient running relatively high A1c of 8.5 with a traumatized left hallux with bleeding of the nailbed with probable contusion with neuropathy allowing her not to feel it along with nail disease 1-5 both feet     Assessment:  Contusion of the left hallux with nail disease and thickness along with trauma to the left foot     Plan:  H&P all conditions discussed clean the area out flushed the wound and applied sterile dressing to compress and it should heal uneventfully.  Gave instructions for continued treatment of beds and patient will be seen back to recheck

## 2019-08-08 ENCOUNTER — Other Ambulatory Visit (HOSPITAL_COMMUNITY): Payer: Medicare Other

## 2019-08-08 ENCOUNTER — Other Ambulatory Visit: Payer: Self-pay | Admitting: Family Medicine

## 2019-08-08 ENCOUNTER — Other Ambulatory Visit (HOSPITAL_COMMUNITY)
Admission: RE | Admit: 2019-08-08 | Discharge: 2019-08-08 | Disposition: A | Discharge status: Home / Self Care | Payer: Medicare Other | Source: Ambulatory Visit | Attending: Pulmonary Disease | Admitting: Pulmonary Disease

## 2019-08-08 DIAGNOSIS — Z01812 Encounter for preprocedural laboratory examination: Secondary | ICD-10-CM | POA: Diagnosis present

## 2019-08-08 DIAGNOSIS — Z20822 Contact with and (suspected) exposure to covid-19: Secondary | ICD-10-CM | POA: Insufficient documentation

## 2019-08-08 LAB — SARS CORONAVIRUS 2 (TAT 6-24 HRS): SARS Coronavirus 2: NEGATIVE

## 2019-08-10 IMAGING — CT CT HEAD W/O CM
4 series · 16 of 47 positions shown, 18 images · non-contrast
Comparison: Head CT 09/01/2016

CLINICAL DATA: Altered level of consciousness.

EXAM:
CT HEAD WITHOUT CONTRAST
TECHNIQUE: Contiguous axial images were obtained from the base of the skull
through the vertex without intravenous contrast.

[Series 3: head wo · axial · 0.41mm/px · z∈[-90,+30]mm · 7 of 33 slices shown, 9 images]
[im 5/33  brain]
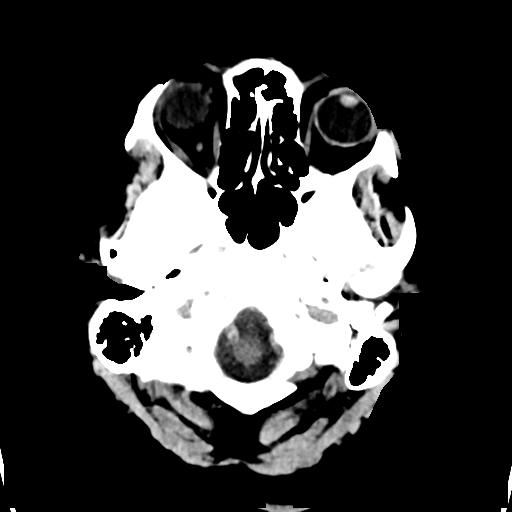
[im 5/33  bone]
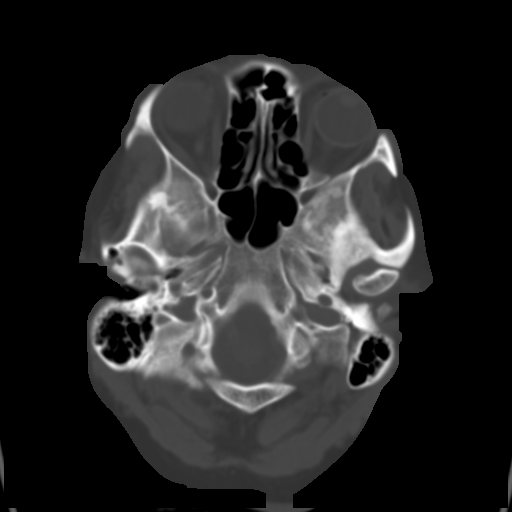
[im 9/33  brain]
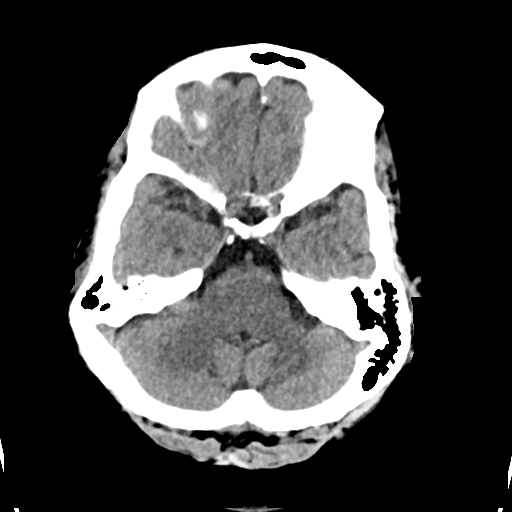
[im 13/33  brain]
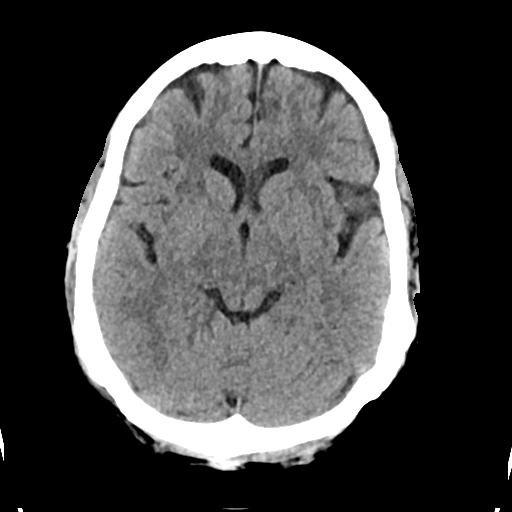
[im 17/33  brain]
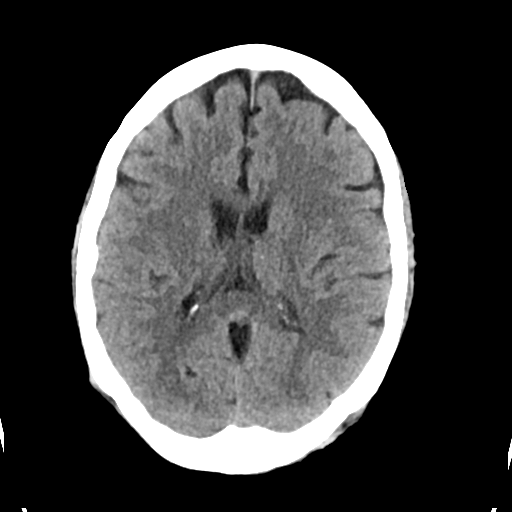
[im 21/33  brain]
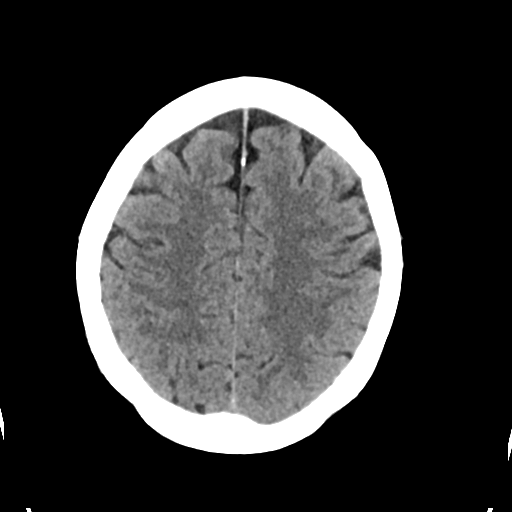
[im 21/33  bone]
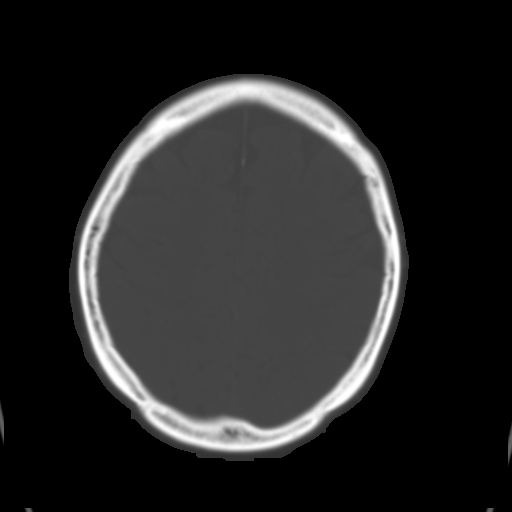
[im 25/33  brain]
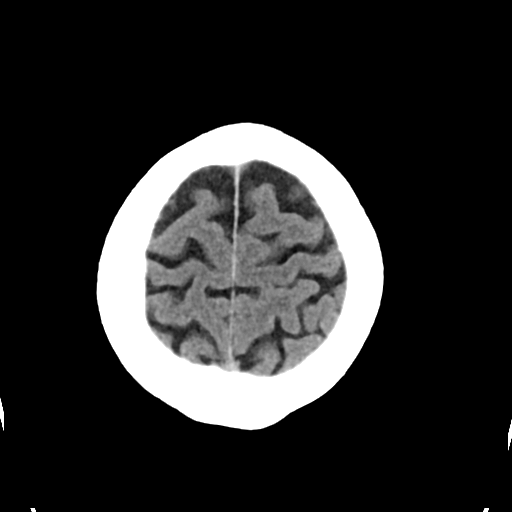
[im 29/33  brain]
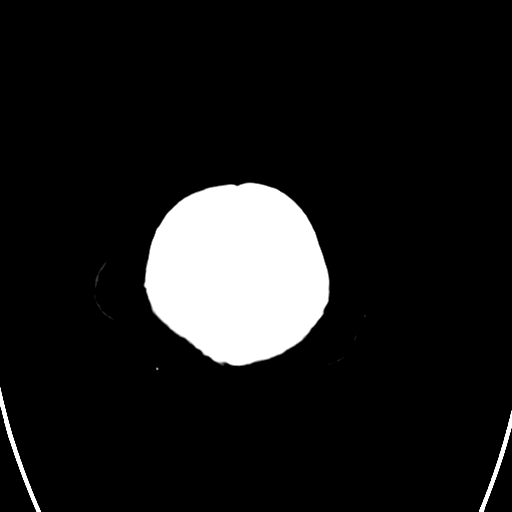

[Series 4: head bone · axial · 0.41mm/px · z∈[-94,-62]mm · 3 of 83 slices shown]
[im 9/83  bone]
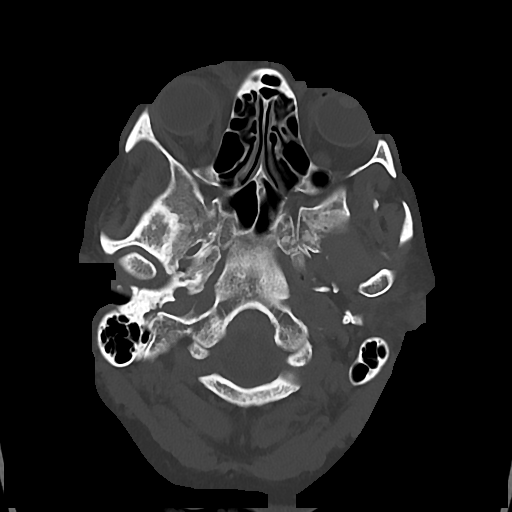
[im 17/83  bone]
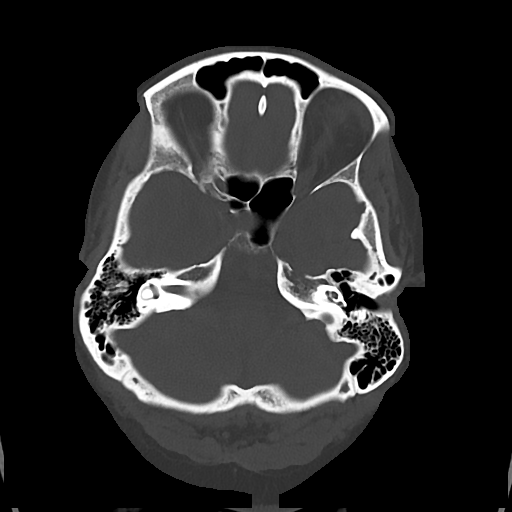
[im 25/83  bone]
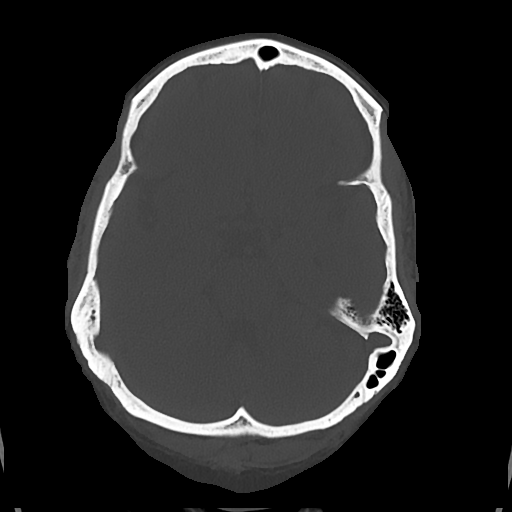

[Series 5: cor soft · coronal · 0.32mm/px · 3 of 69 slices shown]
[im 23/69  brain]
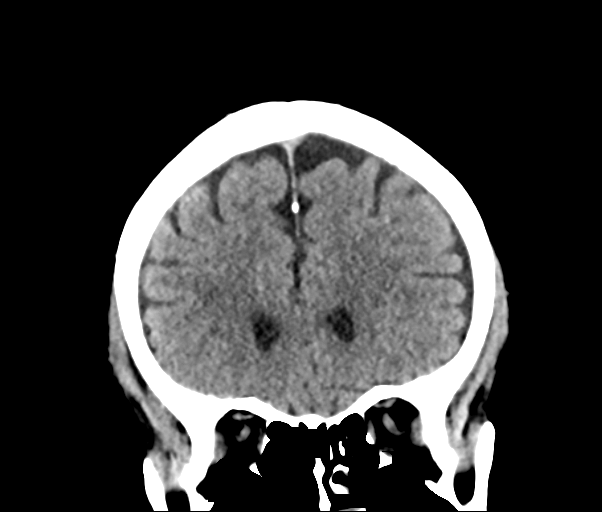
[im 31/69  brain]
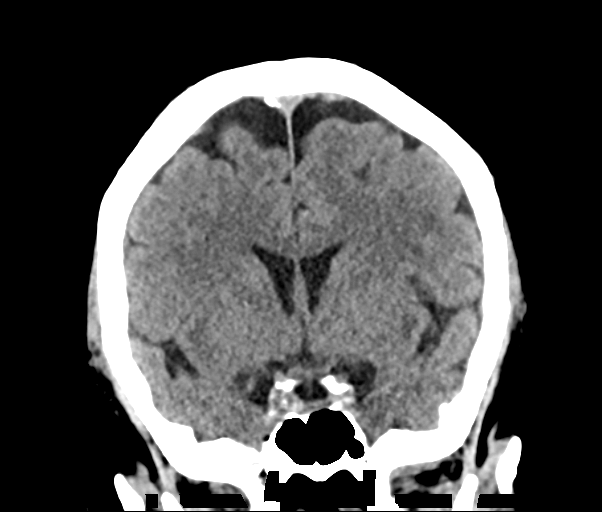
[im 38/69  brain]
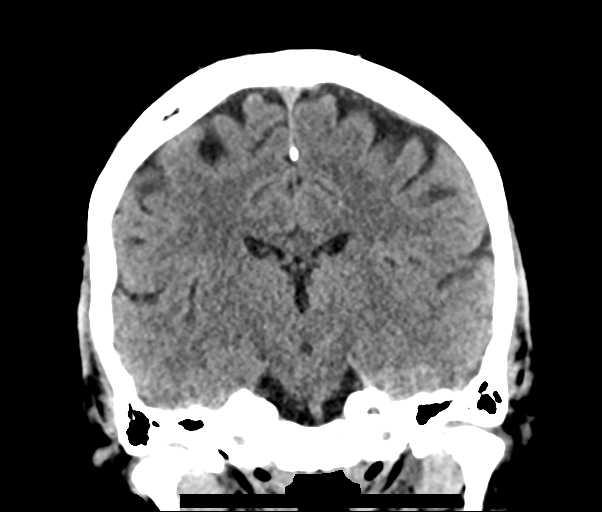

[Series 6: sag soft · sagittal · 0.32mm/px · 3 of 67 slices shown]
[im 23/67  brain]
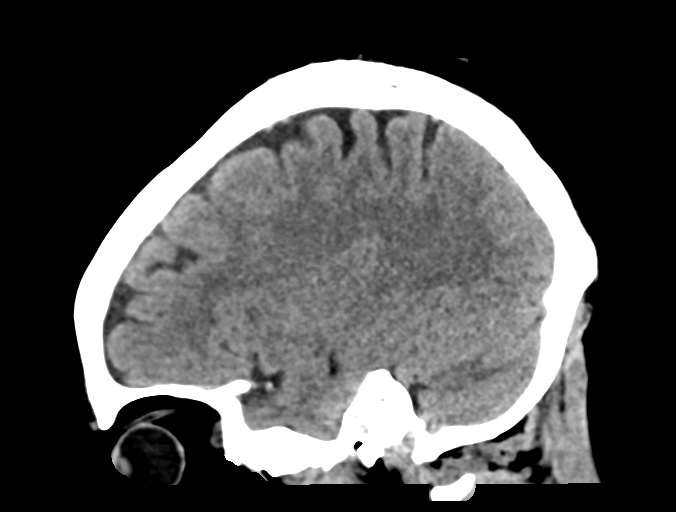
[im 34/67  brain]
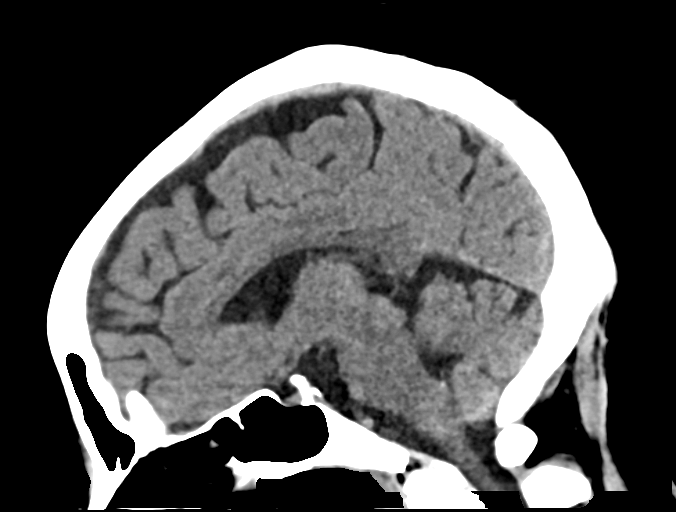
[im 45/67  brain]
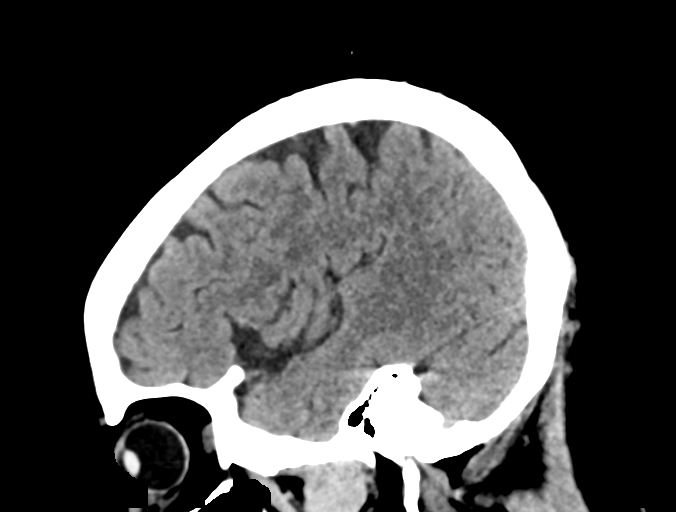

[16 of 47 positions shown; findings below may reference images not displayed]

FINDINGS: Brain: Mild atrophy and chronic small vessel ischemia, similar to
prior. No intracranial hemorrhage, mass effect, or midline shift. No
hydrocephalus. The basilar cisterns are patent. No evidence of
territorial infarct or acute ischemia. No extra-axial or
intracranial fluid collection.

Vascular: No hyperdense vessel or unexpected calcification.

Skull: No fracture or focal lesion.

Sinuses/Orbits: Paranasal sinuses and mastoid air cells are clear.
The visualized orbits are unremarkable.

Other: None.
IMPRESSION: No acute intracranial abnormality.

## 2019-08-11 ENCOUNTER — Other Ambulatory Visit: Payer: Medicare Other

## 2019-08-12 ENCOUNTER — Ambulatory Visit (INDEPENDENT_AMBULATORY_CARE_PROVIDER_SITE_OTHER): Payer: Medicare Other | Admitting: Pulmonary Disease

## 2019-08-12 ENCOUNTER — Other Ambulatory Visit: Payer: Self-pay

## 2019-08-12 ENCOUNTER — Encounter: Payer: Self-pay | Admitting: Pulmonary Disease

## 2019-08-12 ENCOUNTER — Other Ambulatory Visit: Payer: Medicare Other

## 2019-08-12 ENCOUNTER — Telehealth: Payer: Self-pay | Admitting: Family Medicine

## 2019-08-12 VITALS — BP 130/82 | HR 83 | Temp 97.5°F | Ht 66.0 in | Wt 279.2 lb

## 2019-08-12 DIAGNOSIS — R0689 Other abnormalities of breathing: Secondary | ICD-10-CM

## 2019-08-12 DIAGNOSIS — R0600 Dyspnea, unspecified: Secondary | ICD-10-CM

## 2019-08-12 LAB — PULMONARY FUNCTION TEST
DL/VA % pred: 185 %
DL/VA: 7.66 ml/min/mmHg/L
DLCO cor % pred: 113 %
DLCO cor: 24.26 ml/min/mmHg
DLCO unc % pred: 107 %
DLCO unc: 22.89 ml/min/mmHg
FEF 25-75 Post: 2.41 L/sec
FEF 25-75 Pre: 2.19 L/sec
FEF2575-%Change-Post: 9 %
FEF2575-%Pred-Post: 116 %
FEF2575-%Pred-Pre: 106 %
FEV1-%Change-Post: 35 %
FEV1-%Pred-Post: 77 %
FEV1-%Pred-Pre: 57 %
FEV1-Post: 1.68 L
FEV1-Pre: 1.24 L
FEV1FVC-%Change-Post: 1 %
FEV1FVC-%Pred-Pre: 116 %
FEV6-%Change-Post: 33 %
FEV6-%Pred-Post: 67 %
FEV6-%Pred-Pre: 50 %
FEV6-Post: 1.81 L
FEV6-Pre: 1.35 L
FEV6FVC-%Pred-Post: 103 %
FEV6FVC-%Pred-Pre: 103 %
FVC-%Change-Post: 33 %
FVC-%Pred-Post: 65 %
FVC-%Pred-Pre: 48 %
FVC-Post: 1.81 L
FVC-Pre: 1.35 L
Post FEV1/FVC ratio: 93 %
Post FEV6/FVC ratio: 100 %
Pre FEV1/FVC ratio: 92 %
Pre FEV6/FVC Ratio: 100 %
RV % pred: 44 %
RV: 0.96 L
TLC % pred: 60 %
TLC: 3.21 L

## 2019-08-12 NOTE — Telephone Encounter (Signed)
Iona Beard with Cover [employer], is calling to follow up if you have received the appeal information he sent over on 08/08/19. Thanks  Refrence [id number]

## 2019-08-12 NOTE — Addendum Note (Signed)
Addended by: Elton Sin on: 08/12/2019 10:47 AM   Modules accepted: Orders

## 2019-08-12 NOTE — Progress Notes (Signed)
[name]    [id number]    January 09, 1956  Primary Care Physician:Banks, Langley Adie, MD  Referring Physician: Billie Ruddy, MD [place],  [place] [id number]  Chief complaint: Follow up for dyspnea, COPD, asthma, post COVID-21  HPI: 64 year old with history of clotting disorder and recurrent DVT on Coumadin, lupus, OSA on CPAP, right adrenal mass, diabetes  Her chart says that she has COPD, asthma and is a smoker but no PFTs on record and patient claims that she has never smoked but had used cocaine in the past.  She follows with [facility].  Patient tells me that she is on some kind of a steroid inhaler but does not know which one.  Her med list lists only Jearl Klinefelter She was hospitalized in December for COVID-19 infection, presumed COPD exacerbation treated with remdesivir.  Chief complaint is chronic dyspnea on exertion.  Her symptoms have worsened after her hospitalization for COVID-19. Also has lower extremity edema, orthopnea.  Pets: No pets Occupation: Used to work in Teacher, adult education, [employer], [employer], Materials engineer.  Currently on disability Exposures: No known exposures.  No mold, hot tub, Jacuzzi Smoking history: Has not smoked cigarettes.  Used to smoke cocaine from 2003-2006 Travel history: Previously lived in [place] for 10 years in [place] for a year.  No significant recent travel Relevant family history: Strong family history of asthma with multiple family members.  Interim history: States that breathing is stable She is here with her inhalers which we are able to review.  She is currently on Symbicort 160 which she is using just once a day.  She was previously on Incruse as well which was stopped by her allergist.  She had been scheduled for PFTs yesterday but somehow missed the appointment.  Outpatient Encounter Medications as of 08/12/2019  Medication Sig  . Accu-Chek Softclix Lancets lancets USE UP TO FOUR  TIMES DAILY AS DIRECTED.  Marland Kitchen albuterol (PROVENTIL HFA;VENTOLIN HFA) 108 (90 BASE) MCG/ACT inhaler Inhale 2 puffs into the lungs every 4 (four) hours as needed for shortness of breath.  Marland Kitchen albuterol (PROVENTIL) (2.5 MG/3ML) 0.083% nebulizer solution Take 2.5 mg by nebulization every 6 (six) hours as needed for wheezing or shortness of breath.  Marland Kitchen amLODipine (NORVASC) 5 MG tablet Take 5 mg by mouth daily.  Jearl Klinefelter ELLIPTA 62.5-25 MCG/INH AEPB Inhale 1 puff into the lungs daily.   . benzonatate (TESSALON) 100 MG capsule Take 100 mg by mouth 3 (three) times daily as needed.  . blood glucose meter kit and supplies KIT Dispense based on patient and insurance preference. Use up to four times daily as directed. (FOR ICD-9 250.00, 250.01).  . canagliflozin (INVOKANA) 100 MG TABS tablet Take 1 tablet (100 mg total) by mouth daily.  Marland Kitchen CARTIA XT 240 MG 24 hr capsule Take 240 mg by mouth daily.  . cloNIDine (CATAPRES) 0.1 MG tablet Take 0.1 mg by mouth 2 (two) times daily.  . diclofenac sodium (VOLTAREN) 1 % GEL Apply 2 g topically daily as needed (fibromyalgia pain).  . DULoxetine (CYMBALTA) 20 MG capsule Take 20 mg by mouth daily.  . DULoxetine (CYMBALTA) 60 MG capsule Take 60 mg by mouth at bedtime.   . fluticasone (FLONASE) 50 MCG/ACT nasal spray Place 1 spray into both nostrils daily.   Marland Kitchen gabapentin (NEURONTIN) 300 MG capsule Take 300 mg by mouth at bedtime.   . hydrochlorothiazide (HYDRODIURIL) 12.5 MG tablet Take 12.5  mg by mouth daily.   Marland Kitchen levocetirizine (XYZAL) 5 MG tablet Take 5 mg by mouth at bedtime.  Marland Kitchen LINZESS 145 MCG CAPS capsule Take 145 mcg by mouth every other day.   . meclizine (ANTIVERT) 25 MG tablet Take 1 tablet (25 mg total) by mouth 3 (three) times daily as needed for dizziness.  . metFORMIN (GLUCOPHAGE) 500 MG tablet Take 500 mg by mouth at bedtime.  . metoCLOPramide (REGLAN) 10 MG tablet Take 1 tablet (10 mg total) by mouth every 6 (six) hours as needed for nausea (or headache).  Marland Kitchen  omeprazole (PRILOSEC) 20 MG capsule Take 20 mg by mouth 2 (two) times daily.  . pravastatin (PRAVACHOL) 40 MG tablet Take 40 mg by mouth every evening.   Marland Kitchen QUEtiapine (SEROQUEL) 50 MG tablet Take 50 mg by mouth at bedtime.   Marland Kitchen REXULTI 2 MG TABS tablet Take 2 mg by mouth daily.  . SYMBICORT 160-4.5 MCG/ACT inhaler SMARTSIG:2 Puff(s) By Mouth Twice Daily  . tiZANidine (ZANAFLEX) 4 MG tablet Take 4 mg by mouth 2 (two) times daily as needed for muscle spasms.  Marland Kitchen warfarin (COUMADIN) 5 MG tablet TAKE 1 TABLET BY MOUTH DAILY EXCEPT ON MONDAY, WEDNESDAY, AND FRIDAY OR AS DIRECTED. (Patient taking differently: Patient takes 21m Monday, Wednesday, Friday, and 527mon Tuesday, Thursday, Saturday and Sunday.)  . zonisamide (ZONEGRAN) 50 MG capsule Take 50 mg by mouth 2 (two) times a day.   No facility-administered encounter medications on file as of 08/12/2019.   Physical Exam: Blood pressure 130/82, pulse 83, temperature (!) 97.5 F (36.4 C), temperature source Oral, height _0  (1.676 m), weight 279 lb 3.2 oz (126.6 kg), SpO2 97 %. Gen:      No acute distress HEENT:  EOMI, sclera anicteric Neck:     No masses; no thyromegaly Lungs:    Clear to auscultation bilaterally; normal respiratory effort CV:         Regular rate and rhythm; no murmurs Abd:      + bowel sounds; soft, non-tender; no palpable masses, no distension Ext:    No edema; adequate peripheral perfusion Skin:      Warm and dry; no rash Neuro: alert and oriented x 3 Psych: normal mood and affect  Data Reviewed: Imaging: Chest x-ray 12/19/2018-no acute abnormalities. Chest x-ray 03/09/2019-cardiomegaly, bilateral interstitial opacities consistent with pulmonary edema. Chest x-ray 07/09/2019-no acute findings.  I reviewed the images personally.  PFTs: Pending  Labs: CBC 07/09/2019-WBC 6.8, eos 2%, absolute eosinophil count 136. IgE 07/09/2019-12 BNP 07/09/19- 18 Alpha-1 antitrypsin 07/09/2019-126, PI MM  Cardiac: Echocardiogram  02/20/2019-LVEF 6515-05%grade 1 diastolic dysfunction,  Assessment:  Evaluation for dyspnea Probably has underlying asthma, diastolic heart failure.  Not sure if she really has COPD as she does not have a smoking history.   Chest x-ray reviewed with no underlying lung abnormality after her recent hospitalization for COVID-19 pneumonia.  Continue Symbicort inhaler.  Told her to start using 2 puffs twice daily for maximal effect We will need to reschedule PFTs as we could not complete her previously schedule I.  Plan/Recommendations: - Symbicort 2 puffs twice daily - Reschedule PFTs - Obtain medical records from allergy.  PrMarshell GarfinkelD [facility] 08/12/2019, 9:28 AM  CC: BaBillie RuddyMD

## 2019-08-12 NOTE — Patient Instructions (Signed)
We will add Symbicort 160 to your medication list that we have on record Use test 2 puffs twice daily for maximum effect We will need to reschedule your lung function test and make sure that your daughter is called with any scheduling information  Follow-up in 3 to 6 months.

## 2019-08-12 NOTE — Progress Notes (Signed)
Full PFT performed today. °

## 2019-08-13 ENCOUNTER — Telehealth: Payer: Self-pay

## 2019-08-13 ENCOUNTER — Other Ambulatory Visit: Payer: Self-pay

## 2019-08-13 ENCOUNTER — Inpatient Hospital Stay: Payer: Medicare Other | Attending: Oncology

## 2019-08-13 DIAGNOSIS — Z86718 Personal history of other venous thrombosis and embolism: Secondary | ICD-10-CM | POA: Diagnosis not present

## 2019-08-13 DIAGNOSIS — Z7901 Long term (current) use of anticoagulants: Secondary | ICD-10-CM | POA: Diagnosis not present

## 2019-08-13 LAB — PROTIME-INR
INR: 1.8 — ABNORMAL HIGH (ref 0.8–1.2)
Prothrombin Time: 20.5 seconds — ABNORMAL HIGH (ref 11.4–15.2)

## 2019-08-13 NOTE — Telephone Encounter (Signed)
TC to pt per Dr Benay Spice to let her know to continue same coumadin, f/u as scheduled. Patient verbalized understanding.

## 2019-08-13 NOTE — Telephone Encounter (Signed)
An appeal was submitted to pt plan for approval

## 2019-08-26 LAB — HM DIABETES EYE EXAM

## 2019-08-29 ENCOUNTER — Telehealth: Payer: Self-pay | Admitting: *Deleted

## 2019-08-29 NOTE — Telephone Encounter (Signed)
Asking if she can have a CMP drawn at next lab draw here for PT/INR for her neurologist in [facility]. He wants a CMP-she has this written by him on her paperwork. Instructed her to bring the paperwork in with her to have it drawn and results to go directly to him.

## 2019-09-10 ENCOUNTER — Inpatient Hospital Stay: Payer: Medicare Other | Attending: Oncology

## 2019-09-10 ENCOUNTER — Other Ambulatory Visit: Payer: Self-pay

## 2019-09-10 DIAGNOSIS — Z7901 Long term (current) use of anticoagulants: Secondary | ICD-10-CM | POA: Diagnosis not present

## 2019-09-10 LAB — PROTIME-INR
INR: 1.3 — ABNORMAL HIGH (ref 0.8–1.2)
Prothrombin Time: 15.7 seconds — ABNORMAL HIGH (ref 11.4–15.2)

## 2019-09-12 ENCOUNTER — Telehealth: Payer: Self-pay | Admitting: *Deleted

## 2019-09-12 DIAGNOSIS — I82403 Acute embolism and thrombosis of unspecified deep veins of lower extremity, bilateral: Secondary | ICD-10-CM

## 2019-09-12 NOTE — Telephone Encounter (Signed)
Notified of INR low at 1.3. She reports that her Invokana 100 mg daily was resumed on Monday. Confirmed she is on warfarin 5 mg daily w/10 mg MWF. Will recheck INR in 3 weeks. Scheduled while on phone.

## 2019-09-26 ENCOUNTER — Other Ambulatory Visit: Payer: Self-pay | Admitting: Family Medicine

## 2019-10-01 ENCOUNTER — Inpatient Hospital Stay: Payer: Medicare Other | Attending: Oncology

## 2019-10-27 ENCOUNTER — Inpatient Hospital Stay: Payer: Medicare Other | Admitting: Nurse Practitioner

## 2019-10-31 ENCOUNTER — Ambulatory Visit: Payer: Medicare Other | Admitting: Family Medicine

## 2019-11-02 ENCOUNTER — Other Ambulatory Visit: Payer: Self-pay | Admitting: Family Medicine

## 2019-11-03 ENCOUNTER — Telehealth: Payer: Self-pay | Admitting: Family Medicine

## 2019-11-03 ENCOUNTER — Other Ambulatory Visit: Payer: Self-pay | Admitting: Oncology

## 2019-11-03 ENCOUNTER — Other Ambulatory Visit: Payer: Self-pay | Admitting: Nurse Practitioner

## 2019-11-03 DIAGNOSIS — I82403 Acute embolism and thrombosis of unspecified deep veins of lower extremity, bilateral: Secondary | ICD-10-CM

## 2019-11-03 DIAGNOSIS — Z7901 Long term (current) use of anticoagulants: Secondary | ICD-10-CM

## 2019-11-03 NOTE — Telephone Encounter (Signed)
Spoke with pt offered pt appointment to come to the office see another Dr since Dr Volanda Napoleon if off for the next 2 days, pt state that she is not able to come in until Friday, Pt advised to go to UC, pt state that she will go to UC close to her house.

## 2019-11-03 NOTE — Telephone Encounter (Signed)
Pt is wanting some antibiotics called to the pharmacist. She had a boil in the groin area and it has burst.  [facility] [place], [place]  [address] [place] [address]  Phone:  [phone] Fax:  [phone]   Please advise

## 2019-11-07 ENCOUNTER — Ambulatory Visit (INDEPENDENT_AMBULATORY_CARE_PROVIDER_SITE_OTHER): Payer: Medicare Other | Admitting: Podiatry

## 2019-11-07 ENCOUNTER — Encounter: Payer: Self-pay | Admitting: Nurse Practitioner

## 2019-11-07 ENCOUNTER — Inpatient Hospital Stay: Payer: Medicare Other | Attending: Oncology | Admitting: Nurse Practitioner

## 2019-11-07 ENCOUNTER — Telehealth: Payer: Self-pay | Admitting: Nurse Practitioner

## 2019-11-07 ENCOUNTER — Inpatient Hospital Stay: Payer: Medicare Other

## 2019-11-07 ENCOUNTER — Other Ambulatory Visit: Payer: Self-pay

## 2019-11-07 ENCOUNTER — Telehealth: Payer: Self-pay | Admitting: *Deleted

## 2019-11-07 VITALS — BP 134/80 | HR 69 | Temp 98.2°F | Resp 18 | Ht 66.0 in | Wt 291.3 lb

## 2019-11-07 DIAGNOSIS — M79675 Pain in left toe(s): Secondary | ICD-10-CM | POA: Diagnosis not present

## 2019-11-07 DIAGNOSIS — Z86718 Personal history of other venous thrombosis and embolism: Secondary | ICD-10-CM | POA: Insufficient documentation

## 2019-11-07 DIAGNOSIS — I82403 Acute embolism and thrombosis of unspecified deep veins of lower extremity, bilateral: Secondary | ICD-10-CM

## 2019-11-07 DIAGNOSIS — E1142 Type 2 diabetes mellitus with diabetic polyneuropathy: Secondary | ICD-10-CM

## 2019-11-07 DIAGNOSIS — Z7901 Long term (current) use of anticoagulants: Secondary | ICD-10-CM | POA: Diagnosis present

## 2019-11-07 DIAGNOSIS — E119 Type 2 diabetes mellitus without complications: Secondary | ICD-10-CM

## 2019-11-07 DIAGNOSIS — Z0189 Encounter for other specified special examinations: Secondary | ICD-10-CM

## 2019-11-07 DIAGNOSIS — M79674 Pain in right toe(s): Secondary | ICD-10-CM

## 2019-11-07 DIAGNOSIS — B351 Tinea unguium: Secondary | ICD-10-CM | POA: Diagnosis not present

## 2019-11-07 DIAGNOSIS — R600 Localized edema: Secondary | ICD-10-CM | POA: Diagnosis not present

## 2019-11-07 LAB — PROTIME-INR
INR: 1.4 — ABNORMAL HIGH (ref 0.8–1.2)
Prothrombin Time: 16.6 seconds — ABNORMAL HIGH (ref 11.4–15.2)

## 2019-11-07 NOTE — Telephone Encounter (Signed)
-----   Message from Ladell Pier, MD sent at 11/07/2019  5:08 PM EDT -----  ----- Message ----- From: Ladell Pier, MD Sent: 11/07/2019   4:56 PM EDT To: Arlice Colt Pod 2  Please call patient, has she missed any recent Coumadin doses, any new medications?We will need to adjust the Coumadin dose if the PT/INR remained subtherapeuticRepeat PT/INR in 1-2 weeks

## 2019-11-07 NOTE — Telephone Encounter (Signed)
Scheduled per 8/20. Pt requested appts on only on mondays and fridays. Printed avs and calendar for pt.

## 2019-11-07 NOTE — Progress Notes (Signed)
  [facility] OFFICE PROGRESS NOTE   Diagnosis: History of deep vein thrombosis, Coumadin anticoagulation  INTERVAL HISTORY:   [name] returns for follow-up.  She continues Coumadin anticoagulation.  No bleeding except after she recently ruptured a "boil".  No symptoms of thrombosis.  She denies fever, cough, shortness of breath.  Objective:  Vital signs in last 24 hours:  Blood pressure 134/80, pulse 69, temperature 98.2 F (36.8 C), temperature source Tympanic, resp. rate 18, height 5\' 6"  (1.676 m), weight 291 lb 4.8 oz (132.1 kg), SpO2 98 %.    Resp: Lungs clear bilaterally. Cardio: Regular rate and rhythm. GI: Abdomen soft and nontender. Vascular: No leg edema.    Lab Results:  Lab Results  Component Value Date   WBC 6.8 07/09/2019   HGB 11.7 (L) 07/09/2019   HCT 36.2 07/09/2019   MCV 84.8 07/09/2019   PLT 342.0 07/09/2019   NEUTROABS 4.7 07/09/2019    Imaging:  No results found.  Medications: I have reviewed the patient's current medications.  Assessment/Plan: 1. History of bilateral lower extremity deep vein thrombosis. She continues Coumadin anticoagulation. 2. History of a positive lupus anticoagulant. 3. History of a positive beta-2-glycoprotein IgA antibody. 4. Chronic bilateral leg pain - negative bilateral Doppler January 2010. 5. History of hypertension. 6. COPD 7. Status post hysterectomy. 8. Remote history of cocaine abuse. 9. History of an adrenal lesion on a CT scan in 2008 - status post a repeat CT on June 16, 2009, showing slow enlargement of the right adrenal nodule since 2006, most consistent with an adenoma. Stable on a CT 04/24/2014 10. History of rectal and vaginal bleeding in May 2009.  11. Status post carpal tunnel surgery. 12. 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 Right knee replacement September 2011. 13. Multiple orthopedic conditions. 14. Placement of IVC filter preoperatively before knee replacement. The IVC  filter was retrieved 03/01/2011. 15. Anemia. Ferritin in normal range at 82 on 08/18/2013; 61 on 07/10/2016 16. Status post lumbar fusion surgery October 2013 17. Right hydronephrosis-evaluated by Dr. Tresa Moore 18. Left ankle swelling/pain 07/24/2016 19. Mild anemia noted on hospital admission 2018 20. Colonoscopy 04/19/2017-entire examined colon normal. Repeat colonoscopy in 10 years for surveillance. 21. Admission with COVID-19 infection 03/09/2019   Disposition: Ms. Husband appears unchanged.  She continues chronic Coumadin anticoagulation.  We will follow up on the PT/INR from today, adjust Coumadin as indicated.  Plan to continue monthly PT/INR.  She will return for a follow-up visit in 6 months.    Ned Card ANP/GNP-BC   11/07/2019  2:25 PM

## 2019-11-07 NOTE — Telephone Encounter (Signed)
Per Ned Card, NP: change warfarin to 10 mg daily, except 5 mg on MWF. Recheck INR on 11/14/19. Left VM for patient with new directions w/request to call Monday to confirm. Scheduling message sent.

## 2019-11-08 ENCOUNTER — Encounter: Payer: Self-pay | Admitting: Podiatry

## 2019-11-08 NOTE — Progress Notes (Signed)
Subjective: Esli Clements Faires presents today for follow up of for annual diabetic foot examination and painful thick toenails that are difficult to trim. Pain interferes with ambulation. Aggravating factors include wearing enclosed shoe gear. Pain is relieved with periodic professional debridement.   She states her left great toe feels better since Dr. Paulla Dolly removed the toenail. States an old remnant of nail of the left hallux finally came out.   Allergies  Allergen Reactions  . Lisinopril Swelling    THROAT SWELLING Pt states her throat started to "close up" after being on it for "so long."  . Corticosteroids Rash    States she developed a rash on her tongue after a steroid injection in her bac     Objective: There were no vitals filed for this visit.  Vascular Examination:  Capillary fill time to digits <3 seconds b/l lower extremities. Palpable DP pulse(s) b/l lower extremities Nonpalpable PT pulse(s) b/l lower extremities. Pedal hair absent. Lower extremity skin temperature gradient within normal limits. No pain with calf compression b/l. Nonpitting edema noted left foot, right foot, left ankle and right ankle.  Dermatological Examination: Pedal skin with normal turgor, texture and tone bilaterally. No open wounds bilaterally. No interdigital macerations bilaterally. Toenails 2-5 b/l elongated, dystrophic, thickened, crumbly with subungual debris and tenderness to dorsal palpation.   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   Musculoskeletal: Normal muscle strength 5/5 to all lower extremity muscle groups bilaterally. No pain crepitus or joint limitation noted with ROM b/l. No gross bony deformities bilaterally. Patient ambulates independent of any assistive aids.  Neurological: Protective sensation diminished with 10g monofilament b/l. Clonus negative b/l.  Assessment: 1. Pain due to onychomycosis of toenails of both feet   2. Lower extremity edema   3.  Diabetic peripheral neuropathy associated with type 2 diabetes mellitus ([place])   4. Encounter for diabetic foot exam ([place])    Plan: -Diabetic foot examination performed today. -Continue diabetic foot care principles. Literature dispensed on today.  -Toenails 2-5 b/l were debrided in length and girth with sterile nail nippers and dremel without iatrogenic bleeding.  -Patient to continue soft, supportive shoe gear daily. -Patient to report any pedal injuries to medical professional immediately. -Patient/POA to call should there be question/concern in the interim.  Return in about 3 months (around 02/07/2020).

## 2019-11-10 ENCOUNTER — Telehealth: Payer: Self-pay | Admitting: *Deleted

## 2019-11-10 NOTE — Telephone Encounter (Signed)
Called patient to f/u on voice mail left on Friday. She reports she did not listen to her VM. Instructed her to change coumadin to 10 mg daily, except 5 mg MWF and return 8/27 for re-check. Call office if she does not hear from scheduling by Thursday.

## 2019-11-14 ENCOUNTER — Other Ambulatory Visit: Payer: Self-pay

## 2019-11-14 ENCOUNTER — Inpatient Hospital Stay: Payer: Medicare Other

## 2019-11-14 DIAGNOSIS — I82403 Acute embolism and thrombosis of unspecified deep veins of lower extremity, bilateral: Secondary | ICD-10-CM

## 2019-11-14 DIAGNOSIS — Z86718 Personal history of other venous thrombosis and embolism: Secondary | ICD-10-CM | POA: Diagnosis not present

## 2019-11-14 LAB — PROTIME-INR
INR: 2.5 — ABNORMAL HIGH (ref 0.8–1.2)
Prothrombin Time: 26 seconds — ABNORMAL HIGH (ref 11.4–15.2)

## 2019-12-08 ENCOUNTER — Inpatient Hospital Stay: Payer: Medicare Other | Attending: Oncology

## 2020-01-05 ENCOUNTER — Other Ambulatory Visit: Payer: Self-pay

## 2020-01-05 ENCOUNTER — Inpatient Hospital Stay: Payer: Medicare Other | Attending: Oncology

## 2020-01-05 ENCOUNTER — Telehealth: Payer: Self-pay | Admitting: *Deleted

## 2020-01-05 DIAGNOSIS — I82403 Acute embolism and thrombosis of unspecified deep veins of lower extremity, bilateral: Secondary | ICD-10-CM

## 2020-01-05 DIAGNOSIS — Z86718 Personal history of other venous thrombosis and embolism: Secondary | ICD-10-CM | POA: Diagnosis present

## 2020-01-05 DIAGNOSIS — Z7901 Long term (current) use of anticoagulants: Secondary | ICD-10-CM | POA: Insufficient documentation

## 2020-01-05 LAB — PROTIME-INR
INR: 2.1 — ABNORMAL HIGH (ref 0.8–1.2)
Prothrombin Time: 23.2 seconds — ABNORMAL HIGH (ref 11.4–15.2)

## 2020-01-05 NOTE — Telephone Encounter (Signed)
-----   Message from Owens Shark, NP sent at 01/05/2020  4:35 PM EDT ----- Continue same dose of Coumadin.  PT/INR in 1 month.

## 2020-01-05 NOTE — Telephone Encounter (Signed)
Called w/PT results of INR 2.1. Continue coumadin 10 mg daily, except 5 mg MWF. F/U for lab in 1 month as scheduled.

## 2020-02-02 ENCOUNTER — Encounter: Payer: Self-pay | Admitting: Family Medicine

## 2020-02-02 ENCOUNTER — Other Ambulatory Visit: Payer: Self-pay

## 2020-02-02 ENCOUNTER — Ambulatory Visit (INDEPENDENT_AMBULATORY_CARE_PROVIDER_SITE_OTHER): Payer: Medicare Other

## 2020-02-02 ENCOUNTER — Ambulatory Visit (INDEPENDENT_AMBULATORY_CARE_PROVIDER_SITE_OTHER): Payer: Medicare Other | Admitting: Family Medicine

## 2020-02-02 VITALS — BP 138/90 | HR 86 | Temp 98.0°F | Wt 287.4 lb

## 2020-02-02 DIAGNOSIS — I5032 Chronic diastolic (congestive) heart failure: Secondary | ICD-10-CM

## 2020-02-02 DIAGNOSIS — E1169 Type 2 diabetes mellitus with other specified complication: Secondary | ICD-10-CM

## 2020-02-02 DIAGNOSIS — R600 Localized edema: Secondary | ICD-10-CM

## 2020-02-02 DIAGNOSIS — R0602 Shortness of breath: Secondary | ICD-10-CM

## 2020-02-02 DIAGNOSIS — R002 Palpitations: Secondary | ICD-10-CM

## 2020-02-02 NOTE — Progress Notes (Signed)
Subjective:    Patient ID: [name], female    DOB: [birth year], 64 y.o.   MRN: [id number]  No chief complaint on file.   HPI Patient is a 64 year old female with past medical history significant for HTN, chronic diastolic CHF, h/o b/l DVT, OSA on CPAP, DM 2, obesity, hydronephrosis of right kidney, lupus anticoagulant positive, history of depression, fibromyalgia, right adrenal mass, was seen today for acute concern.  Patient endorses trouble breathing worse for the last 2 months.  Started after having COVID in December of 2020.  Pt using 2 albuterol vials in nebulizer at a time BID.  Pt states they make her feel drowsy.  Also using albuterol inhaler.  Having SOB when walking, 3 pillow orthopnea x 4 months.  Weight increased to 290 lbs, now back down. Pt to have INR on Monday with Dr. Ammie Dalton. Taking warfarin 10 mg on MWF, and 5 mg on all other days.  States appetite has decreased.  May have cereal, greens, or popcorn.    Past Medical History:  Diagnosis Date  . Adrenal mass ([place]) 03/226   Benign  . Arthritis    knees/multiple orthopedic conditons; lower back  . Asthma    per pt  . 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 Depression   . Diabetes mellitus    120s usually fasting -  dx more than 10 yrs ago  . Dizziness, nonspecific   . DVT (deep venous thrombosis) (HCC)    Recurrent  . Fibromyalgia   . GERD (gastroesophageal reflux disease)   . Heart murmur   . History of blood transfusion   . History of cocaine abuse ([place])    Remote history   . Hypertension    takes meds daily  . Hypothyroidism   . Lupus Anticoagulant Positive   . On home oxygen therapy    at night  . Shortness of breath    exertion or lying flat  . Sleep apnea    2l of oxygen at night (as of 12/6, she used to)    Allergies  Allergen Reactions  . Lisinopril Swelling    THROAT  SWELLING Pt states her throat started to "close up" after being on it for "so long."  . Corticosteroids Rash    States she developed a rash on her tongue after a steroid injection in her bac    ROS General: Denies fever, chills, night sweats, changes in weight, changes in appetite HEENT: Denies headaches, ear pain, changes in vision, rhinorrhea, sore throat CV: Denies CP, palpitations   +SOB, orthopnea Pulm: Denies cough, wheezing  +SOB GI: Denies abdominal pain, nausea, vomiting, diarrhea, constipation GU: Denies dysuria, hematuria, frequency, vaginal discharge Msk: Denies muscle cramps, joint pains Neuro: Denies weakness, numbness, tingling Skin: Denies rashes, bruising Psych: Denies depression, anxiety, hallucinations     Objective:    Blood pressure 138/90, pulse 86, temperature 98 F (36.7 C), temperature source Oral, weight 287 lb 6.4 oz (130.4 kg), SpO2 99 %.  Gen. Pleasant, well-nourished, in no distress, normal affect  HEENT: [place]/AT, face symmetric, conjunctiva clear, no scleral icterus, PERRLA, EOMI, nares patent without drainage, pharynx without erythema or exudate. Neck: No JVD, no thyromegaly, no carotid bruits Lungs: no accessory muscle use, CTAB, no wheezes or rales Cardiovascular: RRR, no m/r/g, peripheral edema  LLE>RLE Musculoskeletal: TTP of b/l LEs. No deformities, no cyanosis or clubbing, normal tone  Neuro:  A&Ox3, CN II-XII intact, normal gait Skin:  Warm, no lesions/ rash   Wt Readings from Last 3 Encounters:  11/07/19 291 lb 4.8 oz (132.1 kg)  08/12/19 279 lb 3.2 oz (126.6 kg)  07/09/19 279 lb (126.6 kg)    Lab Results  Component Value Date   WBC 6.8 07/09/2019   HGB 11.7 (L) 07/09/2019   HCT 36.2 07/09/2019   PLT 342.0 07/09/2019   GLUCOSE 167 (H) 03/16/2019   CHOL 230 (H) 04/24/2014   TRIG 101 04/24/2014   HDL 82 04/24/2014   LDLCALC 128 (H) 04/24/2014   ALT 50 (H) 03/15/2019   AST 41 03/15/2019   NA 136 03/16/2019   K 3.5 03/16/2019    CL 96 (L) 03/16/2019   CREATININE 0.67 03/16/2019   BUN 17 03/16/2019   CO2 24 03/16/2019   TSH 0.855 08/18/2013   INR 2.1 (H) 01/05/2020   HGBA1C 7.2 (A) 05/21/2019    Assessment/Plan:  SOB (shortness of breath)  -Discussed possible causes including CHF exacerbation, COPD exacerbation, deconditioning, arrhythmia, pneumonia-though less likely given exam -EKG with LVH by voltage criteria.  Similar to EKG from 03/09/19 -will obtain labs to evaluate for CHF -will check INR -continue f/u with Pulm - Plan: EKG 12-Lead, DG Chest 2 View, Brain Natriuretic Peptide, Ambulatory referral to Cardiology  Chronic diastolic CHF (congestive heart failure) (HCC) -continue current meds: Cardia XT240 mg - Plan: DG Chest 2 View, Brain Natriuretic Peptide, TSH, T4, free, Ambulatory referral to Cardiology  Palpitations  -increased Albuterol use likely contributing -also consider anemia, thyroid dysfunction, arhythmia -will obtain labs - Plan: EKG 12-Lead, Hemoglobin A1c, CMP with eGFR([facility]), CBC (no diff), Ambulatory referral to Cardiology, TSH, T4, free  Leg edema, left  -discussed possible causes -discussed supportive care including elevating LEs, decreasing sodium intake -continue warfarin 10 mg MWF and 5 mg all other days. - Plan: [facility]-INR  Controlled type 2 diabetes mellitus with other specified complication, without long-term current use of insulin (HCC)  -last hgb a1c 7.2% on 05/21/2019 -continue invokana 100 mg daily -continue lifestyle modifications - Plan: Lipid panel, Hgb A1C  F/u prn in the next few wks.  [name], MD

## 2020-02-02 NOTE — Patient Instructions (Signed)
Shortness of Breath, Adult Shortness of breath means you have trouble breathing. Shortness of breath could be a sign of a medical problem. Follow these instructions at home:   Watch for any changes in your symptoms.  Do not use any products that contain nicotine or tobacco, such as cigarettes, e-cigarettes, and chewing tobacco.  Do not smoke. Smoking can cause shortness of breath. If you need help to quit smoking, ask your doctor.  Avoid things that can make it harder to breathe, such as: ? Mold. ? Dust. ? Air pollution. ? Chemical smells. ? Things that can cause allergy symptoms (allergens), if you have allergies.  Keep your living space clean. Use products that help remove mold and dust.  Rest as needed. Slowly return to your normal activities.  Take over-the-counter and prescription medicines only as told by your doctor. This includes oxygen therapy and inhaled medicines.  Keep all follow-up visits as told by your doctor. This is important. Contact a doctor if:  Your condition does not get better as soon as expected.  You have a hard time doing your normal activities, even after you rest.  You have new symptoms. Get help right away if:  Your shortness of breath gets worse.  You have trouble breathing when you are resting.  You feel light-headed or you pass out (faint).  You have a cough that is not helped by medicines.  You cough up blood.  You have pain with breathing.  You have pain in your chest, arms, shoulders, or belly (abdomen).  You have a fever.  You cannot walk up stairs.  You cannot exercise the way you normally do. These symptoms may represent a serious problem that is an emergency. Do not wait to see if the symptoms will go away. Get medical help right away. Call your local emergency services (911 in the [place] Do not drive yourself to the hospital. Summary  Shortness of breath is when you have trouble breathing enough air. It can be a sign of a  medical problem.  Avoid things that make it hard for you to breathe, such as smoking, pollution, mold, and dust.  Watch for any changes in your symptoms. Contact your doctor if you do not get better or you get worse. This information is not intended to replace advice given to you by your health care provider. Make sure you discuss any questions you have with your health care provider. Document Revised: 08/06/2017 Document Reviewed: 08/06/2017 Elsevier Patient Education  [place].  Palpitations Palpitations are feelings that your heartbeat is not normal. Your heartbeat may feel like it is:  Uneven.  Faster than normal.  Fluttering.  Skipping a beat. This is usually not a serious problem. In some cases, you may need tests to rule out any serious problems. Follow these instructions at home: Pay attention to any changes in your condition. Take these actions to help manage your symptoms: Eating and drinking  Avoid: ? Coffee, tea, soft drinks, and energy drinks. ? Chocolate. ? Alcohol. ? Diet pills. Lifestyle   Try to lower your stress. These things can help you relax: ? Yoga. ? Deep breathing and meditation. ? Exercise. ? Using words and images to create positive thoughts (guided imagery). ? Using your mind to control things in your body (biofeedback).  Do not use drugs.  Get plenty of rest and sleep. Keep a regular bed time. General instructions   Take over-the-counter and prescription medicines only as told by your doctor.  Do  not use any products that contain nicotine or tobacco, such as cigarettes and e-cigarettes. If you need help quitting, ask your doctor.  Keep all follow-up visits as told by your doctor. This is important. You may need more tests if palpitations do not go away or get worse. Contact a doctor if:  Your symptoms last more than 24 hours.  Your symptoms occur more often. Get help right away if you:  Have chest pain.  Feel short of  breath.  Have a very bad headache.  Feel dizzy.  Pass out (faint). Summary  Palpitations are feelings that your heartbeat is uneven or faster than normal. It may feel like your heart is fluttering or skipping a beat.  Avoid food and drinks that may cause palpitations. These include caffeine, chocolate, and alcohol.  Try to lower your stress. Do not smoke or use drugs.  Get help right away if you faint or have chest pain, shortness of breath, a severe headache, or dizziness. This information is not intended to replace advice given to you by your health care provider. Make sure you discuss any questions you have with your health care provider. Document Revised: 04/18/2017 Document Reviewed: 04/18/2017 Elsevier Patient Education  2020 [place] With Heart Failure  Heart failure is a long-term (chronic) condition in which the heart cannot pump enough blood through the body. When this happens, parts of the body do not get the blood and oxygen they need. There is no cure for heart failure at this time, so it is important for you to take good care of yourself and follow the treatment plan set by your health care provider. If you are living with heart failure, there are ways to help you manage the disease. Follow these instructions at home: Living with heart failure requires you to make changes in your life. Your health care team will teach you about the changes you need to make in order to relieve your symptoms and lower your risk of going to the hospital. Follow the treatment plan as set by your health care provider. Medicines Medicines are important in reducing your heart's workload, slowing the progression of heart failure, and improving your symptoms.  Take over-the-counter and prescription medicines only as told by your health care provider.  Do not stop taking your medicine unless your health care provider tells you to do that.  Do not skip any dose of your  medicine.  Refill prescriptions before you run out of medicine. You need your medicines every day. Eating and drinking   Eat heart-healthy foods. Talk with a dietitian to make an eating plan that is right for you. ? If directed by your health care provider:  Limit salt (sodium). Lowering your sodium intake may reduce symptoms of heart failure. Ask a dietitian to recommend heart-healthy seasonings.  Limit your fluid intake. Fluid restriction may reduce symptoms of heart failure. ? Use low-fat cooking methods instead of frying. Low-fat methods include roasting, grilling, broiling, baking, poaching, steaming, and stir-frying. ? Choose foods that contain no trans fat and are low in saturated fat and cholesterol. Healthy choices include fresh or frozen fruits and vegetables, fish, lean meats, legumes, fat-free or low-fat dairy products, and whole-grain or high-fiber foods.  Limit alcohol intake to no more than 1 drink a day for nonpregnant women and 2 drinks a day for men. One drink equals 12 oz of beer, 5 oz of wine, or 1 oz of hard liquor. ? Drinking more than that is harmful  to your heart. Tell your health care provider if you drink alcohol several times a week. ? Talk with your health care provider about whether any level of alcohol use is safe for you. Activity   Ask your health care provider about attending cardiac rehabilitation. These programs include aerobic physical activity, which provides many benefits for your heart.  If no cardiac rehabilitation program is available, ask your health care provider what aerobic exercises are safe for you to do. Lifestyle Make the lifestyle changes recommended by your health care provider. In general:  Lose weight if your health care provider tells you to do that. Weight loss may reduce symptoms of heart failure.  Do not use any products that contain nicotine or tobacco, such as cigarettes or e-cigarettes. If you need help quitting, ask your  health care provider.  Do not use street (illegal) drugs.  Return to your normal activities as told by your health care provider. Ask your health care provider what activities are safe for you. General instructions   Make sure you weigh yourself every day to track your weight. Rapid weight gain may indicate an increase in fluid in your body and may increase the workload of your heart. ? Weigh yourself every morning. Do this after you urinate but before you eat breakfast. ? Wear the same type of clothing, without shoes, each time you weigh yourself. ? Weigh yourself on the same scale and in the same spot each time.  Living with chronic heart failure often leads to emotions such as fear, stress, anxiety, and depression. If you feel any of these emotions and need help coping, contact your health care provider. Other ways to get help include: ? Talking to friends and family members about your condition. They can give you support and guidance. Explain your symptoms to them and, if comfortable, invite them to attend appointments or rehabilitation with you. ? Joining a support group for people with chronic heart failure. Talking with other people who have the same symptoms may give you new ways of coping with your disease and your emotions.  Stay up to date with your shots (vaccines). Staying current on pneumococcal and influenza vaccines is especially important in preventing germs from attacking your airways (respiratory infections).  Keep all follow-up visits as told by your health care provider. This is important. How to recognize changes in your condition You and your family members need to know what changes to watch for in your condition. Watch for the following changes and report them to your health care provider:  Sudden weight gain. Ask your health care provider what amount of weight gain to report.  Shortness of breath: ? Feeling short of breath while at rest, with no exercise or  activity that required great effort. ? Feeling breathless with activity.  Swelling of your lower legs or ankles.  Difficulty sleeping: ? You wake up feeling short of breath. ? You have to use more pillows to raise your head in order to sleep.  Frequent, dry, hacking cough.  Loss of appetite.  Feeling more tired all the time.  Depression or feelings of sadness or hopelessness.  Bloating in the stomach. Where to find more information  Local support groups. Ask your health care provider about groups near you.  The American Heart Association: www.heart.org Contact a health care provider if:  You have a rapid weight gain.  You have increasing shortness of breath that is unusual for you.  You are unable to participate in your usual  physical activities.  You tire easily.  You cough more than normal, especially with physical activity.  You have any swelling or more swelling in areas such as your hands, feet, ankles, or abdomen.  You feel like your heart is beating quickly (palpitations).  You become dizzy or light-headed when you stand up. Get help right away if:  You have difficulty breathing.  You notice or your family notices a change in your awareness, such as having trouble staying awake or having difficulty with concentration.  You have pain or discomfort in your chest.  You have an episode of fainting (syncope). Summary  There is no cure for heart failure, so it is important for you to take good care of yourself and follow the treatment plan set by your health care provider.  Medicines are important in reducing your heart's workload, slowing the progression of heart failure, and improving your symptoms.  Living with chronic heart failure often leads to emotions such as fear, stress, anxiety, and depression. If you are feeling any of these emotions and need help coping, contact your health care provider. This information is not intended to replace advice given to  you by your health care provider. Make sure you discuss any questions you have with your health care provider. Document Revised: 02/16/2017 Document Reviewed: 07/19/2016 Elsevier Patient Education  2020 [place].  Heart Failure Eating Plan Heart failure, also called congestive heart failure, occurs when your heart does not pump blood well enough to meet your body's needs for oxygen-rich blood. Heart failure is a long-term (chronic) condition. Living with heart failure can be challenging. However, following your health care provider's instructions about a healthy lifestyle and working with a diet and nutrition specialist (dietitian) to choose the right foods may help to improve your symptoms. What are tips for following this plan? Reading food labels  Check food labels for the amount of sodium per serving. Choose foods that have less than 140 mg (milligrams) of sodium in each serving.  Check food labels for the number of calories per serving. This is important if you need to limit your daily calorie intake to lose weight.  Check food labels for the serving size. If you eat more than one serving, you will be eating more sodium and calories than what is listed on the label.  Look for foods that are labeled as "sodium-free," "very low sodium," or "low sodium." ? Foods labeled as "reduced sodium" or "lightly salted" may still have more sodium than what is recommended for you. Cooking  Avoid adding salt when cooking. Ask your health care provider or dietitian before using salt substitutes.  Season food with salt-free seasonings, spices, or herbs. Check the label of seasoning mixes to make sure they do not contain salt.  Cook with heart-healthy oils, such as olive, canola, soybean, or sunflower oil.  Do not fry foods. Cook foods using low-fat methods, such as baking, boiling, grilling, and broiling.  Limit unhealthy fats when cooking by: ? Removing the skin from poultry, such as  chicken. ? Removing all visible fats from meats. ? Skimming the fat off from stews, soups, and gravies before serving them. Meal planning   Limit your intake of: ? Processed, canned, or pre-packaged foods. ? Foods that are high in trans fat, such as fried foods. ? Sweets, desserts, sugary drinks, and other foods with added sugar. ? Full-fat dairy products, such as whole milk.  Eat a balanced diet that includes: ? 4-5 servings of fruit each day  and 4-5 servings of vegetables each day. At each meal, try to fill half of your plate with fruits and vegetables. ? Up to 6-8 servings of whole grains each day. ? Up to 2 servings of lean meat, poultry, or fish each day. One serving of meat is equal to 3 oz. This is about the same size as a deck of cards. ? 2 servings of low-fat dairy each day. ? Heart-healthy fats. Healthy fats called omega-3 fatty acids are found in foods such as flaxseed and cold-water fish like sardines, salmon, and mackerel.  Aim to eat 25-35 g (grams) of fiber a day. Foods that are high in fiber include apples, broccoli, carrots, beans, peas, and whole grains.  Do not add salt or condiments that contain salt (such as soy sauce) to foods before eating.  When eating at a restaurant, ask that your food be prepared with less salt or no salt, if possible.  Try to eat 2 or more vegetarian meals each week.  Eat more home-cooked food and eat less restaurant, buffet, and fast food. General information  Do not eat more than 2,300 mg of salt (sodium) a day. The amount of sodium that is recommended for you may be lower, depending on your condition.  Maintain a healthy body weight as directed. Ask your health care provider what a healthy weight is for you. ? Check your weight every day. ? Work with your health care provider and dietitian to make a plan that is right for you to lose weight or maintain your current weight.  Limit how much fluid you drink. Ask your health care  provider or dietitian how much fluid you can have each day.  Limit or avoid alcohol as told by your health care provider or dietitian. Recommended foods The items listed may not be a complete list. Talk with your dietitian about what dietary choices are best for you. Fruits All fresh, frozen, and canned fruits. Dried fruits, such as raisins, prunes, and cranberries. Vegetables All fresh vegetables. Vegetables that are frozen without sauce or added salt. Low-sodium or sodium-free canned vegetables. Grains Bread with less than 80 mg of sodium per slice. Whole-wheat pasta, quinoa, and brown rice. Oats and oatmeal. Barley. [place]. Grits and cream of wheat. Whole-grain and whole-wheat cold cereal. Meats and other protein foods Lean cuts of meat. Skinless chicken and [place]. Fish with high omega-3 fatty acids, such as salmon, sardines, and other cold-water fishes. Eggs. Dried beans, peas, and edamame. Unsalted nuts and nut butters. Dairy Low-fat or nonfat (skim) milk and dried milk. Rice milk, soy milk, and almond milk. Low-fat or nonfat yogurt. Small amounts of reduced-sodium block cheese. Low-sodium cottage cheese. Fats and oils Olive, canola, soybean, flaxseed, or sunflower oil. Avocado. Sweets and desserts Apple sauce. Granola bars. Sugar-free pudding and gelatin. Frozen fruit bars. Seasoning and other foods Fresh and dried herbs. Lemon or lime juice. Vinegar. Low-sodium ketchup. Salt-free marinades, salad dressings, sauces, and seasonings. The items listed above may not be a complete list of foods and beverages you can eat. Contact a dietitian for more information. Foods to avoid The items listed may not be a complete list. Talk with your dietitian about what dietary choices are best for you. Fruits Fruits that are dried with sodium-containing preservatives. Vegetables Canned vegetables. Frozen vegetables with sauce or seasonings. Creamed vegetables. [place] fries. Onion rings. Pickled  vegetables and sauerkraut. Grains Bread with more than 80 mg of sodium per slice. Hot or cold cereal with more than 140  mg sodium per serving. Salted pretzels and crackers. Pre-packaged breadcrumbs. Bagels, croissants, and biscuits. Meats and other protein foods Ribs and chicken wings. Bacon, ham, pepperoni, bologna, salami, and packaged luncheon meats. Hot dogs, bratwurst, and sausage. Canned meat. Smoked meat and fish. Salted nuts and seeds. Dairy Whole milk, half-and-half, and cream. Buttermilk. Processed cheese, cheese spreads, and cheese curds. Regular cottage cheese. Feta cheese. Shredded cheese. String cheese. Fats and oils Butter, lard, shortening, ghee, and bacon fat. Canned and packaged gravies. Seasoning and other foods Onion salt, garlic salt, table salt, and sea salt. Marinades. Regular salad dressings. Relishes, pickles, and olives. Meat flavorings and tenderizers, and bouillon cubes. Horseradish, ketchup, and mustard. Worcestershire sauce. Teriyaki sauce, soy sauce (including reduced sodium). Hot sauce and Tabasco sauce. Steak sauce, fish sauce, oyster sauce, and cocktail sauce. Taco seasonings. Barbecue sauce. Tartar sauce. The items listed above may not be a complete list of foods and beverages you should avoid. Contact a dietitian for more information. Summary  A heart failure eating plan includes changes that limit your intake of sodium and unhealthy fat, and it may help you lose weight or maintain a healthy weight. Your health care provider may also recommend limiting how much fluid you drink.  Most people with heart failure should eat no more than 2,300 mg of salt (sodium) a day. The amount of sodium that is recommended for you may be lower, depending on your condition.  Contact your health care provider or dietitian before making any major changes to your diet. This information is not intended to replace advice given to you by your health care provider. Make sure you discuss  any questions you have with your health care provider. Document Revised: 05/02/2018 Document Reviewed: 07/21/2016 Elsevier Patient Education  [place].

## 2020-02-03 ENCOUNTER — Encounter (INDEPENDENT_AMBULATORY_CARE_PROVIDER_SITE_OTHER): Payer: Medicare Other | Admitting: Ophthalmology

## 2020-02-03 LAB — PROTIME-INR
INR: 1.7 — ABNORMAL HIGH
Prothrombin Time: 17.5 s — ABNORMAL HIGH (ref 9.0–11.5)

## 2020-02-03 LAB — COMPLETE METABOLIC PANEL WITH GFR
AG Ratio: 1.1 (calc) (ref 1.0–2.5)
ALT: 21 U/L (ref 6–29)
AST: 18 U/L (ref 10–35)
Albumin: 4.4 g/dL (ref 3.6–5.1)
Alkaline phosphatase (APISO): 87 U/L (ref 37–153)
BUN: 19 mg/dL (ref 7–25)
CO2: 27 mmol/L (ref 20–32)
Calcium: 10 mg/dL (ref 8.6–10.4)
Chloride: 100 mmol/L (ref 98–110)
Creat: 0.81 mg/dL (ref 0.50–0.99)
GFR, Est African American: 89 mL/min/{1.73_m2} (ref >=60)
GFR, Est Non African American: 77 mL/min/{1.73_m2} (ref >=60)
Globulin: 4.1 g/dL (calc) — ABNORMAL HIGH (ref 1.9–3.7)
Glucose, Bld: 157 mg/dL — ABNORMAL HIGH (ref 65–99)
Potassium: 3.8 mmol/L (ref 3.5–5.3)
Sodium: 138 mmol/L (ref 135–146)
Total Bilirubin: 0.2 mg/dL (ref 0.2–1.2)
Total Protein: 8.5 g/dL — ABNORMAL HIGH (ref 6.1–8.1)

## 2020-02-03 LAB — CBC
HCT: 38.4 % (ref 35.0–45.0)
Hemoglobin: 12.4 g/dL (ref 11.7–15.5)
MCH: 26.2 pg — ABNORMAL LOW (ref 27.0–33.0)
MCHC: 32.3 g/dL (ref 32.0–36.0)
MCV: 81.2 fL (ref 80.0–100.0)
MPV: 11 fL (ref 7.5–12.5)
Platelets: 308 10*3/uL (ref 140–400)
RBC: 4.73 10*6/uL (ref 3.80–5.10)
RDW: 17 % — ABNORMAL HIGH (ref 11.0–15.0)
WBC: 5.2 10*3/uL (ref 3.8–10.8)

## 2020-02-03 LAB — LIPID PANEL
Cholesterol: 194 mg/dL (ref <200)
HDL: 76 mg/dL (ref >=50)
LDL Cholesterol (Calc): 89 mg/dL (calc)
Non-HDL Cholesterol (Calc): 118 mg/dL (calc) (ref <130)
Total CHOL/HDL Ratio: 2.6 (calc) (ref <5.0)
Triglycerides: 196 mg/dL — ABNORMAL HIGH (ref <150)

## 2020-02-03 LAB — HEMOGLOBIN A1C
Hgb A1c MFr Bld: 8.8 % of total Hgb — ABNORMAL HIGH (ref <5.7)
Mean Plasma Glucose: 206 (calc)
eAG (mmol/L): 11.4 (calc)

## 2020-02-03 LAB — TSH: TSH: 6.5 mIU/L — ABNORMAL HIGH (ref 0.40–4.50)

## 2020-02-03 LAB — T4, FREE: Free T4: 0.8 ng/dL (ref 0.8–1.8)

## 2020-02-03 LAB — BRAIN NATRIURETIC PEPTIDE: Brain Natriuretic Peptide: 11 pg/mL (ref <100)

## 2020-02-06 ENCOUNTER — Other Ambulatory Visit: Payer: Self-pay

## 2020-02-06 ENCOUNTER — Inpatient Hospital Stay: Payer: Medicare Other | Attending: Oncology

## 2020-02-06 ENCOUNTER — Ambulatory Visit: Payer: Medicare Other | Admitting: Podiatry

## 2020-02-06 DIAGNOSIS — Z7901 Long term (current) use of anticoagulants: Secondary | ICD-10-CM | POA: Insufficient documentation

## 2020-02-06 DIAGNOSIS — Z86718 Personal history of other venous thrombosis and embolism: Secondary | ICD-10-CM | POA: Insufficient documentation

## 2020-02-06 DIAGNOSIS — I82403 Acute embolism and thrombosis of unspecified deep veins of lower extremity, bilateral: Secondary | ICD-10-CM

## 2020-02-06 LAB — PROTIME-INR
INR: 2 — ABNORMAL HIGH (ref 0.8–1.2)
Prothrombin Time: 22.1 seconds — ABNORMAL HIGH (ref 11.4–15.2)

## 2020-02-06 MED ORDER — PRAVASTATIN SODIUM 40 MG PO TABS
40.0000 mg | ORAL_TABLET | Freq: Every evening | ORAL | 3 refills | Status: DC
Start: 2020-02-06 — End: 2020-10-26

## 2020-02-09 ENCOUNTER — Other Ambulatory Visit: Payer: Self-pay

## 2020-02-09 ENCOUNTER — Ambulatory Visit (INDEPENDENT_AMBULATORY_CARE_PROVIDER_SITE_OTHER): Payer: Medicare Other | Admitting: Ophthalmology

## 2020-02-09 ENCOUNTER — Encounter (INDEPENDENT_AMBULATORY_CARE_PROVIDER_SITE_OTHER): Payer: Self-pay | Admitting: Ophthalmology

## 2020-02-09 DIAGNOSIS — Z961 Presence of intraocular lens: Secondary | ICD-10-CM | POA: Diagnosis not present

## 2020-02-09 DIAGNOSIS — H43823 Vitreomacular adhesion, bilateral: Secondary | ICD-10-CM | POA: Diagnosis not present

## 2020-02-09 DIAGNOSIS — E118 Type 2 diabetes mellitus with unspecified complications: Secondary | ICD-10-CM

## 2020-02-09 DIAGNOSIS — E119 Type 2 diabetes mellitus without complications: Secondary | ICD-10-CM | POA: Diagnosis not present

## 2020-02-09 NOTE — Assessment & Plan Note (Signed)
Vitreomacular traction may cause vision loss from anatomic distortion to the center of the vision, the macula.  If visual function is symptomatic or threatened, therapy may be needed.  Surgical intervention offers the highest chance of visual stability and improvement.  Distortion of the macula anatomy may cause splitting of the retinal layers, termed foveomacular retinoschisis, which can cause more permanent vision loss.  Epiretinal membranes may also be associated.  Macular hole may also develop if vitreomacular traction progresses. The minor form of this condition is Vitreomacular adhesion, which is a natural change in the aging process of the eye, which requires observation only. 

## 2020-02-09 NOTE — Progress Notes (Signed)
02/09/2020     CHIEF COMPLAINT Patient presents for Retina Follow Up   HISTORY OF PRESENT ILLNESS: [name] is a 64 y.o. female who presents to the clinic today for:   HPI    Retina Follow Up    Patient presents with  Other.  In both eyes.  This started 1 year ago.  Severity is mild.  Duration of 1 year.  Since onset it is gradually worsening.          Comments    1 YR F/U   Pt reports vision has slightly declined, did not improve w/ cataract sx   Last A1C: 7.1 taken last week   Last BS: Doesn't check regularly.            Last edited by Nichola Sizer D on 02/09/2020  9:49 AM. (History)      Referring physician: Billie Ruddy, MD [place],  [place] [phone]  HISTORICAL INFORMATION:   Selected notes from the MEDICAL RECORD NUMBER    Lab Results  Component Value Date   HGBA1C 8.8 (H) 02/02/2020     CURRENT MEDICATIONS: No current outpatient medications on file. (Ophthalmic Drugs)   No current facility-administered medications for this visit. (Ophthalmic Drugs)   Current Outpatient Medications (Other)  Medication Sig  . ACCU-CHEK GUIDE test strip USE UP TO FOUR TIMES DAILY AS DIRECTED.  . Accu-Chek Softclix Lancets lancets USE UP TO FOUR TIMES DAILY AS DIRECTED.  Marland Kitchen albuterol (PROVENTIL HFA;VENTOLIN HFA) 108 (90 BASE) MCG/ACT inhaler Inhale 2 puffs into the lungs every 4 (four) hours as needed for shortness of breath.  Marland Kitchen albuterol (PROVENTIL) (2.5 MG/3ML) 0.083% nebulizer solution Take 2.5 mg by nebulization every 6 (six) hours as needed for wheezing or shortness of breath.  Marland Kitchen amLODipine (NORVASC) 5 MG tablet Take 5 mg by mouth daily.  Jearl Klinefelter ELLIPTA 62.5-25 MCG/INH AEPB Inhale 1 puff into the lungs daily.   . benzonatate (TESSALON) 100 MG capsule Take 100 mg by mouth 3 (three) times daily as needed.  . blood glucose meter kit and supplies KIT Dispense based on patient and insurance preference. Use up to four times  daily as directed. (FOR ICD-9 250.00, 250.01).  Geronimo Boot XT 240 MG 24 hr capsule Take 240 mg by mouth daily.  . cloNIDine (CATAPRES) 0.1 MG tablet Take 0.1 mg by mouth 2 (two) times daily.  . diclofenac sodium (VOLTAREN) 1 % GEL Apply 2 g topically daily as needed (fibromyalgia pain).  . DULoxetine (CYMBALTA) 20 MG capsule Take 20 mg by mouth daily.  . DULoxetine (CYMBALTA) 60 MG capsule Take 60 mg by mouth at bedtime.   . fluticasone (FLONASE) 50 MCG/ACT nasal spray Place 1 spray into both nostrils daily.   Marland Kitchen gabapentin (NEURONTIN) 300 MG capsule Take 300 mg by mouth at bedtime.   . hydrochlorothiazide (HYDRODIURIL) 12.5 MG tablet Take 12.5 mg by mouth daily.   . INVOKANA 100 MG TABS tablet TAKE 1 TABLET(100 MG) BY MOUTH DAILY  . levocetirizine (XYZAL) 5 MG tablet Take 5 mg by mouth at bedtime.  Marland Kitchen LINZESS 145 MCG CAPS capsule Take 145 mcg by mouth every other day.   . meclizine (ANTIVERT) 25 MG tablet Take 1 tablet (25 mg total) by mouth 3 (three) times daily as needed for dizziness.  . metFORMIN (GLUCOPHAGE) 500 MG tablet Take 500 mg by mouth at bedtime.  . metoCLOPramide (REGLAN) 10 MG tablet Take 1 tablet (10 mg total) by mouth  every 6 (six) hours as needed for nausea (or headache).  Marland Kitchen omeprazole (PRILOSEC) 20 MG capsule Take 20 mg by mouth 2 (two) times daily.  . pravastatin (PRAVACHOL) 40 MG tablet Take 1 tablet (40 mg total) by mouth every evening.  Marland Kitchen QUEtiapine (SEROQUEL) 50 MG tablet Take 50 mg by mouth at bedtime.   Marland Kitchen REXULTI 2 MG TABS tablet Take 2 mg by mouth daily.  . SYMBICORT 160-4.5 MCG/ACT inhaler SMARTSIG:2 Puff(s) By Mouth Twice Daily  . tiZANidine (ZANAFLEX) 4 MG tablet Take 4 mg by mouth 2 (two) times daily as needed for muscle spasms.  Marland Kitchen warfarin (COUMADIN) 10 MG tablet TAKE 1 TABLET BY MOUTH ON MONDAY, WEDNESDAY, AND FRIDAY  . warfarin (COUMADIN) 5 MG tablet TAKE 1 TABLET BY MOUTH DAILY EXCEPT ON MONDAY, WEDNESDAY, FRIDAY OR AS DIRECTED  . zonisamide (ZONEGRAN) 50 MG  capsule Take 50 mg by mouth 2 (two) times a day.   No current facility-administered medications for this visit. (Other)      REVIEW OF SYSTEMS:    ALLERGIES Allergies  Allergen Reactions  . Lisinopril Swelling    THROAT SWELLING Pt states her throat started to "close up" after being on it for "so long."  . Corticosteroids Rash    States she developed a rash on her tongue after a steroid injection in her bac    PAST MEDICAL HISTORY Past Medical History:  Diagnosis Date  . Adrenal mass ([place]) 03/226   Benign  . Arthritis    knees/multiple orthopedic conditons; lower back  . Asthma    per pt  . 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 Depression   . Diabetes mellitus    120s usually fasting -  dx more than 10 yrs ago  . Dizziness, nonspecific   . DVT (deep venous thrombosis) (HCC)    Recurrent  . Fibromyalgia   . GERD (gastroesophageal reflux disease)   . Heart murmur   . History of blood transfusion   . History of cocaine abuse ([place])    Remote history   . Hypertension    takes meds daily  . Hypothyroidism   . Lupus Anticoagulant Positive   . On home oxygen therapy    at night  . Shortness of breath    exertion or lying flat  . Sleep apnea    2l of oxygen at night (as of 12/6, she used to)   Past Surgical History:  Procedure Laterality Date  . ABDOMINAL HYSTERECTOMY  1989   Fibroids  . ANTERIOR LUMBAR FUSION  01/03/2012   Procedure: ANTERIOR LUMBAR FUSION 1 LEVEL;  Surgeon: Winfield Cunas, MD;  Location: [facility];  Service: Neurosurgery;  Laterality: N/A;  Lumbar Four-Five Anterior Lumbar Interbody Fusion with Instrumentation  . BACK SURGERY     cervical spine---disk disease  . CARPAL TUNNEL RELEASE     Bilateral  . CESAREAN SECTION    . CHOLECYSTECTOMY    . CYSTOSCOPY/RETROGRADE/URETEROSCOPY/STONE EXTRACTION WITH BASKET Right 04/26/2014     Procedure: CYSTOSCOPY/ RIGHT RETROGRADE/ RIGHT URETEROSCOPY/URETERAL AND RENAL PELVIS BIOPSY;  Surgeon: Alexis Frock, MD;  Location: [facility];  Service: Urology;  Laterality: Right;  . gallstones removed    . 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   bilateral  . right elbow surgery    . THYROID LOBECTOMY Right 02/27/2017  . THYROID LOBECTOMY Right 02/27/2017   Procedure: RIGHT THYROID  LOBECTOMY;  Surgeon: Erroll Luna, MD;  Location: [facility];  Service: General;  Laterality: Right;  . TUBAL LIGATION      FAMILY HISTORY Family History  Problem Relation Age of Onset  . Hypertension Mother   . Diabetes Mother   . Cancer Mother        Pancreatic  . Hypertension Father   . Heart failure Father     SOCIAL HISTORY Social History   Tobacco Use  . Smoking status: Never Smoker  . Smokeless tobacco: Never Used  Vaping Use  . Vaping Use: Never used  Substance Use Topics  . Alcohol use: No    Alcohol/week: 0.0 standard drinks    Comment: beer in past   . Drug use: Yes    Types: Cocaine    Comment: quit 2003-rehab progam in 2005         OPHTHALMIC EXAM:  Base Eye Exam    Visual Acuity (ETDRS)      Right Left   Dist cc 20/20 -1 20/30 +2   Dist ph cc  20/25 -2   Correction: Glasses       Tonometry (Tonopen, 9:55 AM)      Right Left   Pressure 19 18       Pupils      Pupils Dark Light Shape React APD   Right PERRL 4 3 Round Sluggish None   Left PERRL 4 3 Round Sluggish None       Visual Fields (Counting fingers)      Left Right    Full Full       Extraocular Movement      Right Left    Full Full       Neuro/Psych    Oriented x3: Yes       Dilation    Both eyes: 1.0% Mydriacyl, 2.5% Phenylephrine @ 9:56 AM        Slit Lamp and Fundus Exam    External Exam      Right Left   External Normal Normal       Slit Lamp Exam      Right Left   Lids/Lashes Normal Normal   Conjunctiva/Sclera White and quiet White and quiet   Cornea Clear Clear   Anterior  Chamber Deep and quiet Deep and quiet   Iris Round and reactive Round and reactive   Lens Centered posterior chamber intraocular lens, Open posterior capsule Centered posterior chamber intraocular lens, , Open posterior capsule, Posterior capsular opacification   Anterior Vitreous Normal Normal       Fundus Exam      Right Left   Posterior Vitreous Normal Normal   Disc Normal Normal   C/D Ratio 0.55 0.55   Macula Normal Normal   Vessels no DR no DR   Periphery Normal Normal          IMAGING AND PROCEDURES  Imaging and Procedures for 02/09/20  OCT, Retina - OU - Both Eyes       Right Eye Quality was good. Scan locations included subfoveal. Central Foveal Thickness: 258. Progression has improved. Findings include vitreomacular adhesion .   Left Eye Quality was good. Scan locations included subfoveal. Central Foveal Thickness: 263. Progression has improved. Findings include normal observations, normal foveal contour, vitreomacular adhesion .   Notes No active maculopathy OU                ASSESSMENT/PLAN:  Diabetes mellitus without complication ([place]) The patient has diabetes without any  evidence of retinopathy. The patient advised to maintain good blood glucose control, excellent blood pressure control, and favorable levels of cholesterol, low density lipoprotein, and high density lipoproteins. Follow up in 1 year was recommended. Explained that fluctuations in visual acuity , or "out of focus", may result from large variations of blood sugar control.  Vitreomacular adhesion of both eyes Vitreomacular traction may cause vision loss from anatomic distortion to the center of the vision, the macula.  If visual function is symptomatic or threatened, therapy may be needed.  Surgical intervention offers the highest chance of visual stability and improvement.  Distortion of the macula anatomy may cause splitting of the retinal layers, termed foveomacular retinoschisis, which  can cause more permanent vision loss.  Epiretinal membranes may also be associated.  Macular hole may also develop if vitreomacular traction progresses. The minor form of this condition is Vitreomacular adhesion, which is a natural change in the aging process of the eye, which requires observation only.      ICD-10-CM   1. Vitreomacular adhesion of both eyes  H43.823 OCT, Retina - OU - Both Eyes  2. Diabetes mellitus without complication (HCC)  B63.8 OCT, Retina - OU - Both Eyes  3. Controlled type 2 diabetes mellitus with complication, without long-term current use of insulin (HCC)  E11.8   4. Pseudophakia of both eyes  Z96.1     1.  No active maculopathy OU, will continue to observe  2.  3.  Ophthalmic Meds Ordered this visit:  No orders of the defined types were placed in this encounter.      Return in about 1 year (around 02/08/2021) for COLOR FP, OCT.  There are no Patient Instructions on file for this visit.   Explained the diagnoses, plan, and follow up with the patient and they expressed understanding.  Patient expressed understanding of the importance of proper follow up care.   Clent Demark Ivalee Strauser M.D. Diseases & Surgery of the Retina and Vitreous Retina & Diabetic [place] 02/09/20     Abbreviations: M myopia (nearsighted); A astigmatism; H hyperopia (farsighted); P presbyopia; Mrx spectacle prescription;  CTL contact lenses; OD right eye; OS left eye; OU both eyes  XT exotropia; ET esotropia; PEK punctate epithelial keratitis; PEE punctate epithelial erosions; DES dry eye syndrome; MGD meibomian gland dysfunction; ATs artificial tears; PFAT's preservative free artificial tears; [place] nuclear sclerotic cataract; PSC posterior subcapsular cataract; ERM epi-retinal membrane; PVD posterior vitreous detachment; RD retinal detachment; DM diabetes mellitus; DR diabetic retinopathy; NPDR non-proliferative diabetic retinopathy; PDR proliferative diabetic retinopathy; CSME  clinically significant macular edema; DME diabetic macular edema; dbh dot blot hemorrhages; CWS cotton wool spot; POAG primary open angle glaucoma; C/D cup-to-disc ratio; HVF humphrey visual field; GVF goldmann visual field; OCT optical coherence tomography; IOP intraocular pressure; BRVO Branch retinal vein occlusion; CRVO central retinal vein occlusion; CRAO central retinal artery occlusion; BRAO branch retinal artery occlusion; RT retinal tear; SB scleral buckle; PPV pars plana vitrectomy; VH Vitreous hemorrhage; PRP panretinal laser photocoagulation; IVK intravitreal kenalog; VMT vitreomacular traction; MH Macular hole;  NVD neovascularization of the disc; NVE neovascularization elsewhere; AREDS age related eye disease study; ARMD age related macular degeneration; POAG primary open angle glaucoma; EBMD epithelial/anterior basement membrane dystrophy; ACIOL anterior chamber intraocular lens; IOL intraocular lens; PCIOL posterior chamber intraocular lens; Phaco/IOL phacoemulsification with intraocular lens placement; [place] photorefractive keratectomy; LASIK laser assisted in situ keratomileusis; HTN hypertension; DM diabetes mellitus; COPD chronic obstructive pulmonary disease

## 2020-02-09 NOTE — Assessment & Plan Note (Signed)
The patient has diabetes without any evidence of retinopathy. The patient advised to maintain good blood glucose control, excellent blood pressure control, and favorable levels of cholesterol, low density lipoprotein, and high density lipoproteins. Follow up in 1 year was recommended. Explained that fluctuations in visual acuity , or "out of focus", may result from large variations of blood sugar control. 

## 2020-02-10 ENCOUNTER — Telehealth: Payer: Self-pay | Admitting: *Deleted

## 2020-02-10 DIAGNOSIS — I82403 Acute embolism and thrombosis of unspecified deep veins of lower extremity, bilateral: Secondary | ICD-10-CM

## 2020-02-10 DIAGNOSIS — Z7901 Long term (current) use of anticoagulants: Secondary | ICD-10-CM

## 2020-02-10 NOTE — Telephone Encounter (Signed)
Notified of INR 2.0. Per Dr. Benay Spice: continue same warfarin 10 mg daily, except 5 mg MWF. Will recheck in 1 month (already scheduled).

## 2020-02-10 NOTE — Telephone Encounter (Signed)
-----   Message from Ladell Pier, MD sent at 02/06/2020  4:53 PM EST ----- Please call patient, same Coumadin, repeat PT/INR in 1 month

## 2020-02-27 ENCOUNTER — Telehealth: Payer: Self-pay | Admitting: Radiology

## 2020-02-27 ENCOUNTER — Other Ambulatory Visit: Payer: Self-pay

## 2020-02-27 ENCOUNTER — Ambulatory Visit (INDEPENDENT_AMBULATORY_CARE_PROVIDER_SITE_OTHER): Payer: Medicare Other | Admitting: Internal Medicine

## 2020-02-27 ENCOUNTER — Encounter: Payer: Self-pay | Admitting: Internal Medicine

## 2020-02-27 VITALS — BP 134/78 | HR 86 | Ht 66.0 in | Wt 297.0 lb

## 2020-02-27 DIAGNOSIS — I5032 Chronic diastolic (congestive) heart failure: Secondary | ICD-10-CM | POA: Diagnosis not present

## 2020-02-27 DIAGNOSIS — E669 Obesity, unspecified: Secondary | ICD-10-CM

## 2020-02-27 DIAGNOSIS — E1159 Type 2 diabetes mellitus with other circulatory complications: Secondary | ICD-10-CM

## 2020-02-27 DIAGNOSIS — G4733 Obstructive sleep apnea (adult) (pediatric): Secondary | ICD-10-CM | POA: Diagnosis not present

## 2020-02-27 DIAGNOSIS — I825Y3 Chronic embolism and thrombosis of unspecified deep veins of proximal lower extremity, bilateral: Secondary | ICD-10-CM

## 2020-02-27 DIAGNOSIS — E1169 Type 2 diabetes mellitus with other specified complication: Secondary | ICD-10-CM

## 2020-02-27 DIAGNOSIS — Z9989 Dependence on other enabling machines and devices: Secondary | ICD-10-CM

## 2020-02-27 DIAGNOSIS — I5033 Acute on chronic diastolic (congestive) heart failure: Secondary | ICD-10-CM | POA: Insufficient documentation

## 2020-02-27 DIAGNOSIS — R002 Palpitations: Secondary | ICD-10-CM

## 2020-02-27 DIAGNOSIS — I152 Hypertension secondary to endocrine disorders: Secondary | ICD-10-CM

## 2020-02-27 MED ORDER — CANAGLIFLOZIN 100 MG PO TABS: 100.0000 mg | ORAL_TABLET | Freq: Every day | ORAL | 3 refills | Status: DC

## 2020-02-27 NOTE — Patient Instructions (Addendum)
Medication Instructions:  Restart Invokana 100 mg once daily  *If you need a refill on your cardiac medications before your next appointment, please call your pharmacy*   Lab Work: [facility] in one week   If you have labs (blood work) drawn today and your tests are completely normal, you will receive your results only by: Marland Kitchen MyChart Message (if you have MyChart) OR . A paper copy in the mail If you have any lab test that is abnormal or we need to change your treatment, we will call you to review the results.   Testing/Procedures: 14 days  ZIO monitor/ patch Your physician has requested you wear a ZIO patch monitor for __ days.  This is a single patch monitor. [employer] supplies one patch monitor per enrollment. Additional stickers are not available.  Please do not apply patch if you will be having a Nuclear Stress Test, Echocardiogram, Cardiac CT, MRI, or Chest Xray during the time frame you would be wearing the monitor. The patch cannot be worn during these tests. You cannot remove and re-apply the ZIO AT patch monitor.   Your ZIO patch monitor will be sent Fed Ex from [employer] directly to your home address. The monitor may also be mailed to a PO BOX if home delivery is not available. It may take 3-5 days to receive your monitor after you have been enrolled.  Once you have received you monitor, please review enclosed instructions. Your monitor has already been registered assigning a specific monitor serial # to you.   Applying the monitor  Shave hair from upper left chest.  Hold abrader disc by orange tab. Rub abrader in 40 strokes over left upper chest as indicated in your monitor instructions.  Clean area with 4 enclosed alcohol pads. Use all pads to ensure the area is cleaned thoroughly. Let dry.  Apply patch as indicated in monitor instructions. Patch will be placed under collarbone on left side of chest with arrow pointing upward.  Rub patch adhesive wings for 2 minutes. Remove  the white label marked "1". Remove the white label marked "2". Rub patch adhesive wings for 2 additional minutes.  While looking in a mirror, press and release button in center of patch. A small green light will flash 3-4 times. This will be your only indicator the monitor has been turned on.  Do not shower for the first 24 hours. You may shower after the first 24 hours.  Press the button if you feel a symptom. You will hear a small click. Record Date, Time and Symptom in the Patient Log.   Returning your monitor  Remove your patch and place it inside the [place]. In the lower half of the [place] there is a white bag with prepaid postage on it. Place Gateway in bag and seal. Mail package back to [place] as soon as possible. Your physician should have your final report approximately 7 days after you have mailed back your monitor.   Call [place] at [phone] if you have questions regarding your ZIO AT patch monitor. Call them immediately if you see an orange light blinking on your monitor.  If your monitor falls off in less than 4 days contact our Monitor department at [phone]. If your monitor becomes loose or falls off after 4 days call [employer] at [phone] for suggestions on securing your monitor.      Follow-Up: At [facility], you and your health needs are our priority.  As part of our continuing  mission to provide you with exceptional heart care, we have created designated Provider Care Teams.  These Care Teams include your primary Cardiologist (physician) and Advanced Practice Providers (APPs -  Physician Assistants and Nurse Practitioners) who all work together to provide you with the care you need, when you need it.  We recommend signing up for the patient portal called "MyChart".  Sign up information is provided on this After Visit Summary.  MyChart is used to connect with patients for Virtual Visits (Telemedicine).  Patients are able to view  lab/test results, encounter notes, upcoming appointments, etc.  Non-urgent messages can be sent to your provider as well.   To learn more about what you can do with MyChart, go to [email or web address].    Your next appointment:   1 month(s) to 1 1/2 months  The format for your next appointment:   In Person  Provider:   [name], MD   Other Instructions   How to Take Your Blood Pressure You can take your blood pressure at home with a machine. You may need to check your blood pressure at home:  To check if you have high blood pressure (hypertension).  To check your blood pressure over time.  To make sure your blood pressure medicine is working. Supplies needed: You will need a blood pressure machine, or monitor. You can buy one at a drugstore or online. When choosing one:  Choose one with an arm cuff.  Choose one that wraps around your upper arm. Only one finger should fit between your arm and the cuff.  Do not choose one that measures your blood pressure from your wrist or finger. Your doctor can suggest a monitor. How to prepare Avoid these things for 30 minutes before checking your blood pressure:  Drinking caffeine.  Drinking alcohol.  Eating.  Smoking.  Exercising. Five minutes before checking your blood pressure:  Pee.  Sit in a dining chair. Avoid sitting in a soft couch or armchair.  Be quiet. Do not talk. How to take your blood pressure Follow the instructions that came with your machine. If you have a digital blood pressure monitor, these may be the instructions: 1. Sit up straight. 2. Place your feet on the floor. Do not cross your ankles or legs. 3. Rest your left arm at the level of your heart. You may rest it on a table, desk, or chair. 4. Pull up your shirt sleeve. 5. Wrap the blood pressure cuff around the upper part of your left arm. The cuff should be 1 inch (2.5 cm) above your elbow. It is best to wrap the cuff around bare  skin. 6. Fit the cuff snugly around your arm. You should be able to place only one finger between the cuff and your arm. 7. Put the cord inside the groove of your elbow. 8. Press the power button. 9. Sit quietly while the cuff fills with air and loses air. 10. Write down the numbers on the screen. 11. Wait 2-3 minutes and then repeat steps 1-10. What do the numbers mean? Two numbers make up your blood pressure. The first number is called systolic pressure. The second is called diastolic pressure. An example of a blood pressure reading is "120 over 80" (or 120/80). If you are an adult and do not have a medical condition, use this guide to find out if your blood pressure is normal: Normal  First number: below 120.  Second number: below 80. Elevated  First number: 120-129.  Second  number: below 80. Hypertension stage 1  First number: 130-139.  Second number: 80-89. Hypertension stage 2  First number: 140 or above.  Second number: 47 or above. Your blood pressure is above normal even if only the top or bottom number is above normal. Follow these instructions at home:  Check your blood pressure as often as your doctor tells you to.  Take your monitor to your next doctor's appointment. Your doctor will: ? Make sure you are using it correctly. ? Make sure it is working right.  Make sure you understand what your blood pressure numbers should be.  Tell your doctor if your medicines are causing side effects. Contact a doctor if:  Your blood pressure keeps being high. Get help right away if:  Your first blood pressure number is higher than 180.  Your second blood pressure number is higher than 120. This information is not intended to replace advice given to you by your health care provider. Make sure you discuss any questions you have with your health care provider. Document Revised: 02/16/2017 Document Reviewed: 08/13/2015 Elsevier Patient Education  2020 [employer].

## 2020-02-27 NOTE — Progress Notes (Signed)
Cardiology Office Note:    Date:  02/27/2020   ID:  [name], DOB 07/05/1955, MRN [id number]  PCP:  Billie Ruddy, MD  [place] Cardiologist:  No primary care provider on file.  [facility] Electrophysiologist:  None   CC: Shortness of breath Consulted for the evaluation of HFpEF and palpitations at the behest of Billie Ruddy, MD  History of Present Illness:    [name] is a 64 y.o. 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  Patient notes that she is feeling short of breath all the time.  She is taking two breathing treatments a day and still notes have improvement in her shortness of breath.  DOE and fatigue with walking into the office.  All of this has started since she was diagnosed with COVID-19 in 02/2019.  No chest pain chest pressure, chest tightness, chest stinging.  No arm pain, shoulder pain; notes bilaterally knee pain. Notes PND or three pillow orthopnea (three pillows) .  Notes bendopnea, notes slight weight gain, notes leg swelling, but abdominal swelling.  No syncope or near syncope.  Notes palpitations and funny heart beats.   Happens randomly.  Lasts 5-10 minutes  Spontaneously resolves.  Patient feels scared when it occurs.  In the last two weeks it has occurred twice.  Patient reports prior cardiac testing including 2020 echo, distant stress test. Ambulatory BP not done.  Past Medical History:  Diagnosis Date  . Adrenal mass ([place]) 03/226   Benign  . Arthritis    knees/multiple orthopedic conditons; lower back  . Asthma    per pt  . 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 Depression   . Diabetes mellitus    120s usually fasting -  dx more than 10  yrs ago  . Dizziness, nonspecific   . DVT (deep venous thrombosis) (HCC)    Recurrent  . Fibromyalgia   . GERD (gastroesophageal reflux disease)   . Heart murmur   . History of blood transfusion   . History of cocaine abuse ([place])    Remote history   . Hypertension    takes meds daily  . Hypothyroidism   . Lupus Anticoagulant Positive   . On home oxygen therapy    at night  . Shortness of breath    exertion or lying flat  . Sleep apnea    2l of oxygen at night (as of 12/6, she used to)    Past Surgical History:  Procedure Laterality Date  . ABDOMINAL HYSTERECTOMY  1989   Fibroids  . ANTERIOR LUMBAR FUSION  01/03/2012   Procedure: ANTERIOR LUMBAR FUSION 1 LEVEL;  Surgeon: Winfield Cunas, MD;  Location: [place] [facility];  Service: Neurosurgery;  Laterality: N/A;  Lumbar Four-Five Anterior Lumbar Interbody Fusion with Instrumentation  . BACK SURGERY     cervical spine---disk disease  . CARPAL TUNNEL RELEASE     Bilateral  . CESAREAN SECTION    . CHOLECYSTECTOMY    . CYSTOSCOPY/RETROGRADE/URETEROSCOPY/STONE EXTRACTION WITH BASKET Right 04/26/2014   Procedure: CYSTOSCOPY/ RIGHT RETROGRADE/ RIGHT URETEROSCOPY/URETERAL AND RENAL PELVIS BIOPSY;  Surgeon: Alexis Frock, MD;  Location: [facility];  Service: Urology;  Laterality: Right;  . gallstones removed    .  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   bilateral  . right elbow surgery    . THYROID LOBECTOMY Right 02/27/2017  . THYROID LOBECTOMY Right 02/27/2017   Procedure: RIGHT THYROID LOBECTOMY;  Surgeon: Erroll Luna, MD;  Location: [facility];  Service: General;  Laterality: Right;  . TUBAL LIGATION      Current Medications: Current Meds  Medication Sig  . ACCU-CHEK GUIDE test strip USE UP TO FOUR TIMES DAILY AS DIRECTED.  . Accu-Chek Softclix Lancets lancets USE UP TO FOUR TIMES DAILY AS DIRECTED.  Marland Kitchen albuterol (PROVENTIL HFA;VENTOLIN HFA) 108 (90 BASE) MCG/ACT inhaler Inhale 2 puffs into the lungs every 4 (four) hours as needed for  shortness of breath.  Marland Kitchen albuterol (PROVENTIL) (2.5 MG/3ML) 0.083% nebulizer solution Take 2.5 mg by nebulization every 6 (six) hours as needed for wheezing or shortness of breath.  Marland Kitchen amLODipine (NORVASC) 5 MG tablet Take 5 mg by mouth daily.  Jearl Klinefelter ELLIPTA 62.5-25 MCG/INH AEPB Inhale 1 puff into the lungs daily.   . benzonatate (TESSALON) 100 MG capsule Take 100 mg by mouth 3 (three) times daily as needed.  . blood glucose meter kit and supplies KIT Dispense based on patient and insurance preference. Use up to four times daily as directed. (FOR ICD-9 250.00, 250.01).  Geronimo Boot XT 240 MG 24 hr capsule Take 240 mg by mouth daily.  . cloNIDine (CATAPRES) 0.1 MG tablet Take 0.1 mg by mouth 2 (two) times daily.  . diclofenac sodium (VOLTAREN) 1 % GEL Apply 2 g topically daily as needed (fibromyalgia pain).  . DULoxetine (CYMBALTA) 20 MG capsule Take 20 mg by mouth daily.  . DULoxetine (CYMBALTA) 60 MG capsule Take 60 mg by mouth at bedtime.   . fluticasone (FLONASE) 50 MCG/ACT nasal spray Place 1 spray into both nostrils daily.   Marland Kitchen gabapentin (NEURONTIN) 300 MG capsule Take 300 mg by mouth at bedtime.   . hydrochlorothiazide (HYDRODIURIL) 12.5 MG tablet Take 12.5 mg by mouth daily.   Marland Kitchen levocetirizine (XYZAL) 5 MG tablet Take 5 mg by mouth at bedtime.  Marland Kitchen LINZESS 145 MCG CAPS capsule Take 145 mcg by mouth every other day.   . meclizine (ANTIVERT) 25 MG tablet Take 1 tablet (25 mg total) by mouth 3 (three) times daily as needed for dizziness.  . metFORMIN (GLUCOPHAGE) 500 MG tablet Take 500 mg by mouth at bedtime.  . metoCLOPramide (REGLAN) 10 MG tablet Take 1 tablet (10 mg total) by mouth every 6 (six) hours as needed for nausea (or headache).  Marland Kitchen omeprazole (PRILOSEC) 20 MG capsule Take 20 mg by mouth 2 (two) times daily.  . pravastatin (PRAVACHOL) 40 MG tablet Take 1 tablet (40 mg total) by mouth every evening.  Marland Kitchen QUEtiapine (SEROQUEL) 50 MG tablet Take 50 mg by mouth at bedtime.   Marland Kitchen REXULTI 2 MG  TABS tablet Take 2 mg by mouth daily.  . SYMBICORT 160-4.5 MCG/ACT inhaler SMARTSIG:2 Puff(s) By Mouth Twice Daily  . tiZANidine (ZANAFLEX) 4 MG tablet Take 4 mg by mouth 2 (two) times daily as needed for muscle spasms.  Marland Kitchen warfarin (COUMADIN) 10 MG tablet TAKE 1 TABLET BY MOUTH ON MONDAY, WEDNESDAY, AND FRIDAY  . warfarin (COUMADIN) 5 MG tablet TAKE 1 TABLET BY MOUTH DAILY EXCEPT ON MONDAY, WEDNESDAY, FRIDAY OR AS DIRECTED  . zonisamide (ZONEGRAN) 100 MG capsule Take 1 capsule by mouth as directed. Take 1 tablet in the AM and 2 tablets in the PM  . [DISCONTINUED] INVOKANA 100 MG TABS tablet  TAKE 1 TABLET(100 MG) BY MOUTH DAILY    Allergies:   Lisinopril and Corticosteroids   Social History   Socioeconomic History  . Marital status: Divorced    Spouse name: Not on file  . Number of children: Not on file  . Years of education: Not on file  . Highest education level: Not on file  Occupational History  . Not on file  Tobacco Use  . Smoking status: Never Smoker  . Smokeless tobacco: Never Used  Vaping Use  . Vaping Use: Never used  Substance and Sexual Activity  . Alcohol use: No    Alcohol/week: 0.0 standard drinks    Comment: beer in past   . Drug use: Yes    Types: Cocaine    Comment: quit 2003-rehab progam in 2005  . Sexual activity: Never  Other Topics Concern  . Not on file  Social History Narrative   Lives alone in apartment, drives   Social Determinants of Health   Financial Resource Strain: Not on file  Food Insecurity: Not on file  Transportation Needs: Not on file  Physical Activity: Not on file  Stress: Not on file  Social Connections: Not on file    Family History: The patient's family history includes Cancer in her mother; Diabetes in her mother; Heart failure in her father; Hypertension in her father and mother. History of coronary artery disease notable for no members. History of heart failure notable for both parents. History of arrhythmia notable for no  members. Denies family history of sudden cardiac death including drowning, car accidents, or unexplained deaths in the family. Mother->heart murmur  ROS:   Please see the history of present illness.    Notes R thigh pain two weeks ago that is resolved All other systems reviewed and are negative.  EKGs/Labs/Other Studies Reviewed:    The following studies were reviewed today:  EKG:  02/02/20:  SR rate 85 LVH with secondary repolarization  Recent Labs: 03/15/2019: Magnesium 1.4 07/09/2019: Pro B Natriuretic peptide (BNP) 18.0 02/02/2020: ALT 21; Brain Natriuretic Peptide 11; BUN 19; Creat 0.81; Hemoglobin 12.4; Platelets 308; Potassium 3.8; Sodium 138; TSH 6.50  Recent Lipid Panel    Component Value Date/Time   CHOL 194 02/02/2020 1126   TRIG 196 (H) 02/02/2020 1126   HDL 76 02/02/2020 1126   CHOLHDL 2.6 02/02/2020 1126   VLDL 20 04/24/2014 2245   LDLCALC 89 02/02/2020 1126   02/2019 Echocardiogram; E/E'10 estimated LAP ~ 14 mm Hg 1. Left ventricular ejection fraction, by visual estimation, is 65 to  70%. The left ventricle has normal function. There is no left ventricular  hypertrophy.  2. Left ventricular diastolic parameters are consistent with Grade I  diastolic dysfunction (impaired relaxation).  3. Global right ventricle has normal systolic function.The right  ventricular size is normal. No increase in right ventricular wall  thickness.  4. Left atrial size was normal.  5. Right atrial size was normal.  6. The mitral valve is normal in structure. No evidence of mitral valve  regurgitation. No evidence of mitral stenosis.  7. The tricuspid valve is normal in structure. Tricuspid valve  regurgitation is not demonstrated.  8. The aortic valve is normal in structure. Aortic valve regurgitation is  not visualized. No evidence of aortic valve sclerosis or stenosis.  9. The pulmonic valve was normal in structure. Pulmonic valve  regurgitation is trivial.  10. TR  signal is inadequate for assessing pulmonary artery systolic  pressure.   Risk Assessment/Calculations:  H2FPEF Score 44% probability of HFpEF; based on 02/20/2019 Echo  Physical Exam:    VS:  BP 134/78   Pulse 86   Ht 5' 6" (1.676 m)   Wt 297 lb (134.7 kg)   SpO2 98%   BMI 47.94 kg/m     Wt Readings from Last 3 Encounters:  02/27/20 297 lb (134.7 kg)  02/02/20 287 lb 6.4 oz (130.4 kg)  11/07/19 291 lb 4.8 oz (132.1 kg)     GEN: Obese female, well developed with no acute distress HEENT: Normal NECK: No JVD (difficult exam) 45 degrees; No carotid bruits LYMPHATICS: No lymphadenopathy CARDIAC: RRR, no murmurs, rubs, gallops RESPIRATORY:  Clear to auscultation without rales, wheezing or rhonchi; air movement better than expected ABDOMEN: Soft, non-tender, non-distended MUSCULOSKELETAL:  +2 edema; No deformity  SKIN: Warm and dry NEUROLOGIC:  Alert and oriented x 3 PSYCHIATRIC:  Normal affect   ASSESSMENT:    1. Chronic heart failure with preserved ejection fraction ([place])   2. 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 Obesity, diabetes, and hypertension syndrome ([place])   4. Obstructive sleep apnea on CPAP   5. Palpitations    PLAN:    In order of problems listed above:  Heart Failure Preserved Ejection Fraction  In the setting of Morbid Obesity, COPD and OSA overlap DVT on chronic AC - NYHA class II, Stage B-C, hypervolemic, etiology in the setting of DM/HTN - Diuretic regimen: will resend her Invokana 100 mg PO Daily; discussed with her [employer] and it is covered - Normal BNP values but in the setting of morbid obesity  - low threshold to start aldactone or switch HCTZ to lasix - Offered Stress Testing (LexiScan Stress for eval of DOE) Patient was hesitant because distant stress test caused discomfort; patient agreed that we would re-assess this at her next visit - appreciate pulm  recs  Obesity/DM/HTN OSA on CPAP - ambulatory blood pressure not done, will start ambulatory BP monitoring; gave education on how to perform ambulatory blood pressure monitoring including the frequency and technique; [place] ambulatory blood pressure < 135/85 on average; if she needs help getting a cuff can reach out to our social worked for assistance - continue home medications except as above - will get labs in 7-10 days (BMET, Mg) - OSA and is on CPAP followed but OSH Sleep Medicine - discussed diet (DASH/low sodium), and exercise/weight loss interventions   Palpitations; possible AF - will obtain 14-day non live heart monitor (ZioPatch) - AV Nodal Therapy: continue diltiazem  1.5/2 months follow up unless new symptoms or abnormal test results warranting change in plan  Would be reasonable for  APP Follow up    Medication Adjustments/Labs and Tests Ordered: Current medicines are reviewed at length with the patient today.  Concerns regarding medicines are outlined above.  Orders Placed This Encounter  Procedures  . Basic Metabolic Panel (BMET)  . LONG TERM MONITOR (3-14 DAYS)   Meds ordered this encounter  Medications  . canagliflozin (INVOKANA) 100 MG TABS tablet    Sig: Take 1 tablet (100 mg total) by mouth daily before breakfast.    Dispense:  90 tablet    Refill:  3    Patient Instructions  Medication Instructions:  Restart Invokana 100 mg once daily  *If you need a refill on your cardiac medications before your next appointment, please call your pharmacy*   Lab Work: Bmet in one week   If  you have labs (blood work) drawn today and your tests are completely normal, you will receive your results only by: Marland Kitchen MyChart Message (if you have MyChart) OR . A paper copy in the mail If you have any lab test that is abnormal or we need to change your treatment, we will call you to review the results.   Testing/Procedures: 14 days  ZIO monitor/ patch Your physician has  requested you wear a ZIO patch monitor for __ days.  This is a single patch monitor. [employer] supplies one patch monitor per enrollment. Additional stickers are not available.  Please do not apply patch if you will be having a Nuclear Stress Test, Echocardiogram, Cardiac CT, MRI, or Chest Xray during the time frame you would be wearing the monitor. The patch cannot be worn during these tests. You cannot remove and re-apply the ZIO AT patch monitor.   Your ZIO patch monitor will be sent Fed Ex from [employer] directly to your home address. The monitor may also be mailed to a PO BOX if home delivery is not available. It may take 3-5 days to receive your monitor after you have been enrolled.  Once you have received you monitor, please review enclosed instructions. Your monitor has already been registered assigning a specific monitor serial # to you.   Applying the monitor  Shave hair from upper left chest.  Hold abrader disc by orange tab. Rub abrader in 40 strokes over left upper chest as indicated in your monitor instructions.  Clean area with 4 enclosed alcohol pads. Use all pads to ensure the area is cleaned thoroughly. Let dry.  Apply patch as indicated in monitor instructions. Patch will be placed under collarbone on left side of chest with arrow pointing upward.  Rub patch adhesive wings for 2 minutes. Remove the white label marked "1". Remove the white label marked "2". Rub patch adhesive wings for 2 additional minutes.  While looking in a mirror, press and release button in center of patch. A small green light will flash 3-4 times. This will be your only indicator the monitor has been turned on.  Do not shower for the first 24 hours. You may shower after the first 24 hours.  Press the button if you feel a symptom. You will hear a small click. Record Date, Time and Symptom in the Patient Log.   Returning your monitor  Remove your patch and place it inside the [place]. In the lower  half of the [place] there is a white bag with prepaid postage on it. Place [place] in bag and seal. Mail package back to [place] as soon as possible. Your physician should have your final report approximately 7 days after you have mailed back your monitor.   Call [place] at [phone] if you have questions regarding your ZIO AT patch monitor. Call them immediately if you see an orange light blinking on your monitor.  If your monitor falls off in less than 4 days contact our Monitor department at [phone]. If your monitor becomes loose or falls off after 4 days call [employer] at [phone] for suggestions on securing your monitor.      Follow-Up: At [facility], you and your health needs are our priority.  As part of our continuing mission to provide you with exceptional heart care, we have created designated Provider Care Teams.  These Care Teams include your primary Cardiologist (physician) and Advanced Practice Providers (APPs -  Physician Assistants and Nurse Practitioners) who  all work together to provide you with the care you need, when you need it.  We recommend signing up for the patient portal called "MyChart".  Sign up information is provided on this After Visit Summary.  MyChart is used to connect with patients for Virtual Visits (Telemedicine).  Patients are able to view lab/test results, encounter notes, upcoming appointments, etc.  Non-urgent messages can be sent to your provider as well.   To learn more about what you can do with MyChart, go to [email or web address].    Your next appointment:   1 month(s) to 1 1/2 months  The format for your next appointment:   In Person  Provider:   [name], MD   Other Instructions   How to Take Your Blood Pressure You can take your blood pressure at home with a machine. You may need to check your blood pressure at home:  To check if you have high blood pressure (hypertension).  To  check your blood pressure over time.  To make sure your blood pressure medicine is working. Supplies needed: You will need a blood pressure machine, or monitor. You can buy one at a drugstore or online. When choosing one:  Choose one with an arm cuff.  Choose one that wraps around your upper arm. Only one finger should fit between your arm and the cuff.  Do not choose one that measures your blood pressure from your wrist or finger. Your doctor can suggest a monitor. How to prepare Avoid these things for 30 minutes before checking your blood pressure:  Drinking caffeine.  Drinking alcohol.  Eating.  Smoking.  Exercising. Five minutes before checking your blood pressure:  Pee.  Sit in a dining chair. Avoid sitting in a soft couch or armchair.  Be quiet. Do not talk. How to take your blood pressure Follow the instructions that came with your machine. If you have a digital blood pressure monitor, these may be the instructions: 1. Sit up straight. 2. Place your feet on the floor. Do not cross your ankles or legs. 3. Rest your left arm at the level of your heart. You may rest it on a table, desk, or chair. 4. Pull up your shirt sleeve. 5. Wrap the blood pressure cuff around the upper part of your left arm. The cuff should be 1 inch (2.5 cm) above your elbow. It is best to wrap the cuff around bare skin. 6. Fit the cuff snugly around your arm. You should be able to place only one finger between the cuff and your arm. 7. Put the cord inside the groove of your elbow. 8. Press the power button. 9. Sit quietly while the cuff fills with air and loses air. 10. Write down the numbers on the screen. 11. Wait 2-3 minutes and then repeat steps 1-10. What do the numbers mean? Two numbers make up your blood pressure. The first number is called systolic pressure. The second is called diastolic pressure. An example of a blood pressure reading is "120 over 80" (or 120/80). If you are an adult  and do not have a medical condition, use this guide to find out if your blood pressure is normal: Normal  First number: below 120.  Second number: below 80. Elevated  First number: 120-129.  Second number: below 80. Hypertension stage 1  First number: 130-139.  Second number: 80-89. Hypertension stage 2  First number: 140 or above.  Second number: 6 or above. Your blood pressure is above normal even if  only the top or bottom number is above normal. Follow these instructions at home:  Check your blood pressure as often as your doctor tells you to.  Take your monitor to your next doctor's appointment. Your doctor will: ? Make sure you are using it correctly. ? Make sure it is working right.  Make sure you understand what your blood pressure numbers should be.  Tell your doctor if your medicines are causing side effects. Contact a doctor if:  Your blood pressure keeps being high. Get help right away if:  Your first blood pressure number is higher than 180.  Your second blood pressure number is higher than 120. This information is not intended to replace advice given to you by your health care provider. Make sure you discuss any questions you have with your health care provider. Document Revised: 02/16/2017 Document Reviewed: 08/13/2015 Elsevier Patient Education  2020 [place], Werner Lean, MD  02/27/2020 3:05 PM    [facility]

## 2020-02-27 NOTE — Telephone Encounter (Signed)
Enrolled patient for a 14 day Zio XT to be mailed to patients home 

## 2020-03-01 ENCOUNTER — Ambulatory Visit (INDEPENDENT_AMBULATORY_CARE_PROVIDER_SITE_OTHER): Payer: Medicare Other

## 2020-03-01 DIAGNOSIS — I152 Hypertension secondary to endocrine disorders: Secondary | ICD-10-CM

## 2020-03-01 DIAGNOSIS — I5032 Chronic diastolic (congestive) heart failure: Secondary | ICD-10-CM

## 2020-03-01 DIAGNOSIS — G4733 Obstructive sleep apnea (adult) (pediatric): Secondary | ICD-10-CM

## 2020-03-01 DIAGNOSIS — R002 Palpitations: Secondary | ICD-10-CM | POA: Diagnosis not present

## 2020-03-01 DIAGNOSIS — I825Y3 Chronic embolism and thrombosis of unspecified deep veins of proximal lower extremity, bilateral: Secondary | ICD-10-CM

## 2020-03-01 DIAGNOSIS — E119 Type 2 diabetes mellitus without complications: Secondary | ICD-10-CM

## 2020-03-01 DIAGNOSIS — E1159 Type 2 diabetes mellitus with other circulatory complications: Secondary | ICD-10-CM

## 2020-03-01 DIAGNOSIS — E669 Obesity, unspecified: Secondary | ICD-10-CM

## 2020-03-04 DIAGNOSIS — J449 Chronic obstructive pulmonary disease, unspecified: Secondary | ICD-10-CM | POA: Diagnosis not present

## 2020-03-04 NOTE — Progress Notes (Signed)
Subjective:   [name] is a 64 y.o. female who presents for an Initial Medicare Annual Wellness Visit.  Review of Systems    N/A  Cardiac Risk Factors include: diabetes mellitus;dyslipidemia;obesity (BMI >30kg/m2);hypertension     Objective:    Today's Vitals   03/05/20 1041  BP: 130/84  Pulse: 88  Temp: 98.1 F (36.7 C)  TempSrc: Oral  SpO2: 97%  Weight: 291 lb 4 oz (132.1 kg)  Height: 5' 6" (1.676 m)   Body mass index is 47.01 kg/m.  Advanced Directives 03/05/2020 04/28/2019 03/09/2019 02/19/2019 04/29/2018 03/04/2018 02/27/2017  Does Patient Have a Medical Advance Directive? No No Unable to assess, patient is non-responsive or altered mental status No No No No  Would patient like information on creating a medical advance directive? Yes (MAU/Ambulatory/Procedural Areas - Information given) No - Patient declined - Yes (ED - Information included in AVS) No - Patient declined - No - Patient declined  Pre-existing out of facility DNR order (yellow form or pink MOST form) - - - - - - -    Current Medications (verified) Outpatient Encounter Medications as of 03/05/2020  Medication Sig  . ACCU-CHEK GUIDE test strip USE UP TO FOUR TIMES DAILY AS DIRECTED.  . Accu-Chek Softclix Lancets lancets USE UP TO FOUR TIMES DAILY AS DIRECTED.  Marland Kitchen albuterol (PROVENTIL HFA;VENTOLIN HFA) 108 (90 BASE) MCG/ACT inhaler Inhale 2 puffs into the lungs every 4 (four) hours as needed for shortness of breath.  Marland Kitchen albuterol (PROVENTIL) (2.5 MG/3ML) 0.083% nebulizer solution Take 2.5 mg by nebulization every 6 (six) hours as needed for wheezing or shortness of breath.  Marland Kitchen amLODipine (NORVASC) 5 MG tablet Take 5 mg by mouth daily.  Jearl Klinefelter ELLIPTA 62.5-25 MCG/INH AEPB Inhale 1 puff into the lungs daily.   . blood glucose meter kit and supplies KIT Dispense based on patient and insurance preference. Use up to four times daily as directed. (FOR ICD-9 250.00, 250.01).  . canagliflozin (INVOKANA) 100 MG  TABS tablet Take 1 tablet (100 mg total) by mouth daily before breakfast.  . CARTIA XT 240 MG 24 hr capsule Take 240 mg by mouth daily.  . cloNIDine (CATAPRES) 0.1 MG tablet Take 0.1 mg by mouth 2 (two) times daily.  . diclofenac sodium (VOLTAREN) 1 % GEL Apply 2 g topically daily as needed (fibromyalgia pain).  . DULoxetine (CYMBALTA) 20 MG capsule Take 20 mg by mouth daily.  . DULoxetine (CYMBALTA) 60 MG capsule Take 60 mg by mouth at bedtime.   . fluticasone (FLONASE) 50 MCG/ACT nasal spray Place 1 spray into both nostrils daily.   Marland Kitchen gabapentin (NEURONTIN) 300 MG capsule Take 300 mg by mouth at bedtime.   . hydrochlorothiazide (HYDRODIURIL) 12.5 MG tablet Take 12.5 mg by mouth daily.   Marland Kitchen levocetirizine (XYZAL) 5 MG tablet Take 5 mg by mouth at bedtime.  Marland Kitchen LINZESS 145 MCG CAPS capsule Take 145 mcg by mouth every other day.   . meclizine (ANTIVERT) 25 MG tablet Take 1 tablet (25 mg total) by mouth 3 (three) times daily as needed for dizziness.  . metFORMIN (GLUCOPHAGE) 500 MG tablet Take 500 mg by mouth at bedtime.  Marland Kitchen omeprazole (PRILOSEC) 20 MG capsule Take 20 mg by mouth 2 (two) times daily.  . pravastatin (PRAVACHOL) 40 MG tablet Take 1 tablet (40 mg total) by mouth every evening.  Marland Kitchen QUEtiapine (SEROQUEL) 50 MG tablet Take 50 mg by mouth at bedtime.   Marland Kitchen REXULTI 2 MG TABS tablet Take  2 mg by mouth daily.  . SYMBICORT 160-4.5 MCG/ACT inhaler SMARTSIG:2 Puff(s) By Mouth Twice Daily  . tiZANidine (ZANAFLEX) 4 MG tablet Take 4 mg by mouth 2 (two) times daily as needed for muscle spasms.  Marland Kitchen warfarin (COUMADIN) 10 MG tablet TAKE 1 TABLET BY MOUTH ON MONDAY, WEDNESDAY, AND FRIDAY  . warfarin (COUMADIN) 5 MG tablet TAKE 1 TABLET BY MOUTH DAILY EXCEPT ON MONDAY, WEDNESDAY, FRIDAY OR AS DIRECTED  . zonisamide (ZONEGRAN) 100 MG capsule Take 1 capsule by mouth as directed. Take 1 tablet in the AM and 2 tablets in the PM  . benzonatate (TESSALON) 100 MG capsule Take 100 mg by mouth 3 (three) times daily  as needed. (Patient not taking: Reported on 03/05/2020)  . metoCLOPramide (REGLAN) 10 MG tablet Take 1 tablet (10 mg total) by mouth every 6 (six) hours as needed for nausea (or headache). (Patient not taking: Reported on 03/05/2020)   No facility-administered encounter medications on file as of 03/05/2020.    Allergies (verified) Lisinopril and Corticosteroids   History: Past Medical History:  Diagnosis Date  . Adrenal mass ([place]) 03/226   Benign  . Arthritis    knees/multiple orthopedic conditons; lower back  . Asthma    per pt  . 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 Depression   . Diabetes mellitus    120s usually fasting -  dx more than 10 yrs ago  . Dizziness, nonspecific   . DVT (deep venous thrombosis) (HCC)    Recurrent  . Fibromyalgia   . GERD (gastroesophageal reflux disease)   . Heart murmur   . History of blood transfusion   . History of cocaine abuse ([place])    Remote history   . Hypertension    takes meds daily  . Hypothyroidism   . Lupus Anticoagulant Positive   . On home oxygen therapy    at night  . Shortness of breath    exertion or lying flat  . Sleep apnea    2l of oxygen at night (as of 12/6, she used to)   Past Surgical History:  Procedure Laterality Date  . ABDOMINAL HYSTERECTOMY  1989   Fibroids  . ANTERIOR LUMBAR FUSION  01/03/2012   Procedure: ANTERIOR LUMBAR FUSION 1 LEVEL;  Surgeon: Winfield Cunas, MD;  Location: [facility];  Service: Neurosurgery;  Laterality: N/A;  Lumbar Four-Five Anterior Lumbar Interbody Fusion with Instrumentation  . BACK SURGERY     cervical spine---disk disease  . CARPAL TUNNEL RELEASE     Bilateral  . CESAREAN SECTION    . CHOLECYSTECTOMY    . CYSTOSCOPY/RETROGRADE/URETEROSCOPY/STONE EXTRACTION WITH BASKET Right 04/26/2014   Procedure: CYSTOSCOPY/ RIGHT RETROGRADE/ RIGHT URETEROSCOPY/URETERAL  AND RENAL PELVIS BIOPSY;  Surgeon: Alexis Frock, MD;  Location: [facility];  Service: Urology;  Laterality: Right;  . gallstones removed    . 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   bilateral  . right elbow surgery    . THYROID LOBECTOMY Right 02/27/2017  . THYROID LOBECTOMY Right 02/27/2017   Procedure: RIGHT THYROID LOBECTOMY;  Surgeon: Erroll Luna, MD;  Location: [facility];  Service: General;  Laterality: Right;  . TUBAL LIGATION     Family History  Problem Relation Age of Onset  . Hypertension Mother   . Diabetes Mother   . Cancer Mother        Pancreatic  . Hypertension Father   .  Heart failure Father    Social History   Socioeconomic History  . Marital status: Divorced    Spouse name: Not on file  . Number of children: Not on file  . Years of education: Not on file  . Highest education level: Not on file  Occupational History  . Not on file  Tobacco Use  . Smoking status: Never Smoker  . Smokeless tobacco: Never Used  Vaping Use  . Vaping Use: Never used  Substance and Sexual Activity  . Alcohol use: No    Alcohol/week: 0.0 standard drinks    Comment: beer in past   . Drug use: Yes    Types: Cocaine    Comment: quit 2003-rehab progam in 2005  . Sexual activity: Never  Other Topics Concern  . Not on file  Social History Narrative   Lives alone in apartment, drives   Social Determinants of Health   Financial Resource Strain: Low Risk   . Difficulty of Paying Living Expenses: Not hard at all  Food Insecurity: No Food Insecurity  . Worried About Charity fundraiser in the Last Year: Never true  . Ran Out of Food in the Last Year: Never true  Transportation Needs: No Transportation Needs  . Lack of Transportation (Medical): No  . Lack of Transportation (Non-Medical): No  Physical Activity: Inactive  . Days of Exercise per Week: 0 days  . Minutes of Exercise per Session: 0 min  Stress: No Stress Concern Present  . Feeling of Stress : Not at all  Social  Connections: Moderately Isolated  . Frequency of Communication with Friends and Family: More than three times a week  . Frequency of Social Gatherings with Friends and Family: More than three times a week  . Attends Religious Services: More than 4 times per year  . Active Member of Clubs or Organizations: No  . Attends Archivist Meetings: Never  . Marital Status: Divorced    Tobacco Counseling Counseling given: Not Answered   Clinical Intake:  Pre-visit preparation completed: Yes  Pain : No/denies pain     Nutritional Risks: Nausea/ vomitting/ diarrhea (Vomiting) Diabetes: Yes CBG done?: No Did pt. bring in CBG monitor from home?: No  How often do you need to have someone help you when you read instructions, pamphlets, or other written materials from your doctor or pharmacy?: 3 - Sometimes What is the last grade level you completed in school?: 9th grade  Diabetic?yes Nutrition Risk Assessment:  Has the patient had any N/V/D within the last 2 months?  Yes  Does the patient have any non-healing wounds?  No  Has the patient had any unintentional weight loss or weight gain?  No   Diabetes:  Is the patient diabetic?  Yes  If diabetic, was a CBG obtained today?  No  Did the patient bring in their glucometer from home?  No  How often do you monitor your CBG's? Patient states had lost her glucose monitoring kit just found it. Usually checks glucose every morning and at bedtime before she goes to bed    Financial Strains and Diabetes Management:  Are you having any financial strains with the device, your supplies or your medication? No .  Does the patient want to be seen by Chronic Care Management for management of their diabetes?  No  Would the patient like to be referred to a Nutritionist or for Diabetic Management?  No   Diabetic Exams:  Diabetic Eye Exam: Overdue for diabetic  eye exam. Pt has been advised about the importance in completing this exam. Patient  advised to call and schedule an eye exam. Diabetic Foot Exam: Overdue, Pt has been advised about the importance in completing this exam. Pt is scheduled for diabetic foot exam on 03/26/2020.   Interpreter Needed?: No  Information entered by :: [place] of Daily Living In your present state of health, do you have any difficulty performing the following activities: 03/05/2020 03/10/2019  Hearing? Y N  Vision? Y N  Comment has difficulties reading up close -  Difficulty concentrating or making decisions? Y N  Walking or climbing stairs? Y Y  Dressing or bathing? N Y  Doing errands, shopping? N Y  Conservation officer, nature and eating ? N -  Using the Toilet? N -  In the past six months, have you accidently leaked urine? N -  Do you have problems with loss of bowel control? N -  Managing your Medications? N -  Managing your Finances? N -  Housekeeping or managing your Housekeeping? N -  Some recent data might be hidden    Patient Care Team: Billie Ruddy, MD as PCP - General (Family Medicine)  Indicate any recent Medical Services you may have received from other than [facility] providers in the past year (date may be approximate).     Assessment:   This is a routine wellness examination for [facility].  Hearing/Vision screen  Hearing Screening   125Hz 250Hz 500Hz 1000Hz 2000Hz 3000Hz 4000Hz 6000Hz 8000Hz  Right ear:           Left ear:           Vision Screening Comments: Patient states gets eyes checked once per year  Dietary issues and exercise activities discussed: Current Exercise Habits: The patient does not participate in regular exercise at present  Goals    . Exercise 3x per week (30 min per time)    . Weight (lb) < 230 lb (104.3 kg)      Depression Screen PHQ 2/9 Scores 03/05/2020 04/23/2019  PHQ - 2 Score 0 2  PHQ- 9 Score 0 11    Fall Risk Fall Risk  03/05/2020 10/13/2013  Falls in the past year? 1 Yes  Number falls in past yr: 1 2 or more  Injury with Fall?  0 -  Risk for fall due to : History of fall(s);Impaired balance/gait History of fall(s);Impaired balance/gait;Impaired mobility;Medication side effect  Follow up Falls evaluation completed;Falls prevention discussed -    FALL RISK PREVENTION PERTAINING TO THE HOME:  Any stairs in or around the home? No  If so, are there any without handrails? No  Home free of loose throw rugs in walkways, pet beds, electrical cords, etc? Yes  Adequate lighting in your home to reduce risk of falls? Yes   ASSISTIVE DEVICES UTILIZED TO PREVENT FALLS:  Life alert? No  Use of a cane, walker or w/c? Yes  Grab bars in the bathroom? Yes  Shower chair or bench in shower? No  Elevated toilet seat or a handicapped toilet? Yes   TIMED UP AND GO:  Was the test performed? Yes .  Length of time to ambulate 10 feet: 8  sec.   Gait slow and steady with assistive device  Cognitive Function:     6CIT Screen 03/05/2020  What Year? 0 points  What month? 0 points  What time? 0 points  Count back from 20 0 points  Months in reverse 2 points  Repeat phrase 10 points  Total Score 12    Immunizations Immunization History  Administered Date(s) Administered  . Influenza Split 01/08/2012  . Influenza,inj,Quad PF,6+ Mos 12/31/2018  . PFIZER SARS-COV-2 Vaccination 06/30/2019, 07/21/2019  . Pneumococcal Polysaccharide-23 01/08/2012    TDAP status: Due, Education has been provided regarding the importance of this vaccine. Advised may receive this vaccine at local pharmacy or Health Dept. Aware to provide a copy of the vaccination record if obtained from local pharmacy or Health Dept. Verbalized acceptance and understanding.  Flu Vaccine status: Declined, Education has been provided regarding the importance of this vaccine but patient still declined. Advised may receive this vaccine at local pharmacy or Health Dept. Aware to provide a copy of the vaccination record if obtained from local pharmacy or Health Dept.  Verbalized acceptance and understanding.  Pneumococcal vaccine status: Up to date  Covid-19 vaccine status: Completed vaccines  Qualifies for Shingles Vaccine? Yes   Zostavax completed No   Shingrix Completed?: No.    Education has been provided regarding the importance of this vaccine. Patient has been advised to call insurance company to determine out of pocket expense if they have not yet received this vaccine. Advised may also receive vaccine at local pharmacy or Health Dept. Verbalized acceptance and understanding.  Screening Tests Health Maintenance  Topic Date Due  . Hepatitis C Screening  Never done  . TETANUS/TDAP  Never done  . PAP SMEAR-Modifier  Never done  . MAMMOGRAM  01/09/2019  . FOOT EXAM  09/16/2019  . INFLUENZA VACCINE  10/19/2019  . COVID-19 Vaccine (3 - Booster for Pfizer series) 01/21/2020  . HEMOGLOBIN A1C  08/01/2020  . OPHTHALMOLOGY EXAM  08/25/2020  . COLONOSCOPY  04/20/2027  . HIV Screening  Completed    Health Maintenance  Health Maintenance Due  Topic Date Due  . Hepatitis C Screening  Never done  . TETANUS/TDAP  Never done  . PAP SMEAR-Modifier  Never done  . MAMMOGRAM  01/09/2019  . FOOT EXAM  09/16/2019  . INFLUENZA VACCINE  10/19/2019  . COVID-19 Vaccine (3 - Booster for Pfizer series) 01/21/2020    Colorectal cancer screening: Type of screening: Colonoscopy. Completed 04/19/2017. Repeat every 10 years  Mammogram status: Ordered 03/05/2020. Pt provided with contact info and advised to call to schedule appt.   Bone Density status: Completed 02/18/2016. Results reflect: Bone density results: NORMAL. Repeat every 5 years.  Lung Cancer Screening: (Low Dose CT Chest recommended if Age 18-80 years, 30 pack-year currently smoking OR have quit w/in 15years.) does not qualify.   Lung Cancer Screening Referral: N/A   Additional Screening:  Hepatitis C Screening: does qualify;   Vision Screening: Recommended annual ophthalmology exams for  early detection of glaucoma and other disorders of the eye. Is the patient up to date with their annual eye exam?  Yes  Who is the provider or what is the name of the office in which the patient attends annual eye exams? Dr. Zadie Rhine  If pt is not established with a provider, would they like to be referred to a provider to establish care? No .   Dental Screening: Recommended annual dental exams for proper oral hygiene  Community Resource Referral / Chronic Care Management: CRR required this visit?  No   CCM required this visit?  No      Plan:     I have personally reviewed and noted the following in the patient's chart:   . Medical and social history . Use of  alcohol, tobacco or illicit drugs  . Current medications and supplements . Functional ability and status . Nutritional status . Physical activity . Advanced directives . List of other physicians . Hospitalizations, surgeries, and ER visits in previous 12 months . Vitals . Screenings to include cognitive, depression, and falls . Referrals and appointments  In addition, I have reviewed and discussed with patient certain preventive protocols, quality metrics, and best practice recommendations. A written personalized care plan for preventive services as well as general preventive health recommendations were provided to patient.     Ofilia Neas, LPN   28/00/3491   Nurse Notes: None

## 2020-03-05 ENCOUNTER — Other Ambulatory Visit: Payer: Self-pay | Admitting: Oncology

## 2020-03-05 ENCOUNTER — Ambulatory Visit (INDEPENDENT_AMBULATORY_CARE_PROVIDER_SITE_OTHER): Payer: Medicare Other

## 2020-03-05 ENCOUNTER — Other Ambulatory Visit: Payer: Self-pay

## 2020-03-05 ENCOUNTER — Other Ambulatory Visit: Payer: Medicare Other | Admitting: *Deleted

## 2020-03-05 VITALS — BP 130/84 | HR 88 | Temp 98.1°F | Ht 66.0 in | Wt 291.2 lb

## 2020-03-05 DIAGNOSIS — I825Y3 Chronic embolism and thrombosis of unspecified deep veins of proximal lower extremity, bilateral: Secondary | ICD-10-CM | POA: Diagnosis not present

## 2020-03-05 DIAGNOSIS — Z0000 Encounter for general adult medical examination without abnormal findings: Secondary | ICD-10-CM

## 2020-03-05 DIAGNOSIS — E1159 Type 2 diabetes mellitus with other circulatory complications: Secondary | ICD-10-CM

## 2020-03-05 DIAGNOSIS — R002 Palpitations: Secondary | ICD-10-CM

## 2020-03-05 DIAGNOSIS — I5032 Chronic diastolic (congestive) heart failure: Secondary | ICD-10-CM | POA: Diagnosis not present

## 2020-03-05 DIAGNOSIS — I10 Essential (primary) hypertension: Secondary | ICD-10-CM | POA: Diagnosis not present

## 2020-03-05 DIAGNOSIS — E1169 Type 2 diabetes mellitus with other specified complication: Secondary | ICD-10-CM | POA: Diagnosis not present

## 2020-03-05 DIAGNOSIS — G4733 Obstructive sleep apnea (adult) (pediatric): Secondary | ICD-10-CM

## 2020-03-05 DIAGNOSIS — E669 Obesity, unspecified: Secondary | ICD-10-CM

## 2020-03-05 DIAGNOSIS — I152 Hypertension secondary to endocrine disorders: Secondary | ICD-10-CM

## 2020-03-05 LAB — BASIC METABOLIC PANEL
BUN/Creatinine Ratio: 17 (ref 12–28)
BUN: 13 mg/dL (ref 8–27)
CO2: 23 mmol/L (ref 20–29)
Calcium: 9.5 mg/dL (ref 8.7–10.3)
Chloride: 100 mmol/L (ref 96–106)
Creatinine, Ser: 0.77 mg/dL (ref 0.57–1.00)
GFR calc Af Amer: 94 mL/min/{1.73_m2} (ref >59)
GFR calc non Af Amer: 82 mL/min/{1.73_m2} (ref >59)
Glucose: 177 mg/dL — ABNORMAL HIGH (ref 65–99)
Potassium: 3.6 mmol/L (ref 3.5–5.2)
Sodium: 139 mmol/L (ref 134–144)

## 2020-03-05 NOTE — Patient Instructions (Signed)
[name] , Thank you for taking time to come for your Medicare Wellness Visit. I appreciate your ongoing commitment to your health goals. Please review the following plan we discussed and let me know if I can assist you in the future.   Screening recommendations/referrals: Colonoscopy: Up to date, next due 04/20/2027 Mammogram: Currently due orders placed this visit  Bone Density: Up to date, next due 02/17/2021 Recommended yearly ophthalmology/optometry visit for glaucoma screening and checkup Recommended yearly dental visit for hygiene and checkup  Vaccinations: Influenza vaccine: Patient declined  Pneumococcal vaccine: Up to date, next due 01/07/2021 Tdap vaccine: Currently due you may contact you insurance or await and injury to receive  Shingles vaccine: Currently due for shingrix, you may receive at your local pharmacy    Advanced directives: Please completed Advanced Directives and return to our office so that we may scan into your chart.  Conditions/risks identified: None   Next appointment: 03/26/2020 @ 9 am with Dr. Volanda Napoleon.  Preventive Care 40-64 Years, Female Preventive care refers to lifestyle choices and visits with your health care provider that can promote health and wellness. What does preventive care include?  A yearly physical exam. This is also called an annual well check.  Dental exams once or twice a year.  Routine eye exams. Ask your health care provider how often you should have your eyes checked.  Personal lifestyle choices, including:  Daily care of your teeth and gums.  Regular physical activity.  Eating a healthy diet.  Avoiding tobacco and drug use.  Limiting alcohol use.  Practicing safe sex.  Taking low-dose aspirin daily starting at age 5.  Taking vitamin and mineral supplements as recommended by your health care provider. What happens during an annual well check? The services and screenings done by your health care provider during your  annual well check will depend on your age, overall health, lifestyle risk factors, and family history of disease. Counseling  Your health care provider may ask you questions about your:  Alcohol use.  Tobacco use.  Drug use.  Emotional well-being.  Home and relationship well-being.  Sexual activity.  Eating habits.  Work and work Statistician.  Method of birth control.  Menstrual cycle.  Pregnancy history. Screening  You may have the following tests or measurements:  Height, weight, and BMI.  Blood pressure.  Lipid and cholesterol levels. These may be checked every 5 years, or more frequently if you are over 88 years old.  Skin check.  Lung cancer screening. You may have this screening every year starting at age 74 if you have a 30-pack-year history of smoking and currently smoke or have quit within the past 15 years.  Fecal occult blood test (FOBT) of the stool. You may have this test every year starting at age 31.  Flexible sigmoidoscopy or colonoscopy. You may have a sigmoidoscopy every 5 years or a colonoscopy every 10 years starting at age 56.  Hepatitis C blood test.  Hepatitis B blood test.  Sexually transmitted disease (STD) testing.  Diabetes screening. This is done by checking your blood sugar (glucose) after you have not eaten for a while (fasting). You may have this done every 1-3 years.  Mammogram. This may be done every 1-2 years. Talk to your health care provider about when you should start having regular mammograms. This may depend on whether you have a family history of breast cancer.  BRCA-related cancer screening. This may be done if you have a family history of breast,  ovarian, tubal, or peritoneal cancers.  Pelvic exam and Pap test. This may be done every 3 years starting at age 56. Starting at age 32, this may be done every 5 years if you have a Pap test in combination with an HPV test.  Bone density scan. This is done to screen for  osteoporosis. You may have this scan if you are at high risk for osteoporosis. Discuss your test results, treatment options, and if necessary, the need for more tests with your health care provider. Vaccines  Your health care provider may recommend certain vaccines, such as:  Influenza vaccine. This is recommended every year.  Tetanus, diphtheria, and acellular pertussis (Tdap, Td) vaccine. You may need a Td booster every 10 years.  Zoster vaccine. You may need this after age 4.  Pneumococcal 13-valent conjugate (PCV13) vaccine. You may need this if you have certain conditions and were not previously vaccinated.  Pneumococcal polysaccharide (PPSV23) vaccine. You may need one or two doses if you smoke cigarettes or if you have certain conditions. Talk to your health care provider about which screenings and vaccines you need and how often you need them. This information is not intended to replace advice given to you by your health care provider. Make sure you discuss any questions you have with your health care provider. Document Released: 04/02/2015 Document Revised: 11/24/2015 Document Reviewed: 01/05/2015 Elsevier Interactive Patient Education  2017 [place] Prevention in the Home Falls can cause injuries. They can happen to people of all ages. There are many things you can do to make your home safe and to help prevent falls. What can I do on the outside of my home?  Regularly fix the edges of walkways and driveways and fix any cracks.  Remove anything that might make you trip as you walk through a door, such as a raised step or threshold.  Trim any bushes or trees on the path to your home.  Use bright outdoor lighting.  Clear any walking paths of anything that might make someone trip, such as rocks or tools.  Regularly check to see if handrails are loose or broken. Make sure that both sides of any steps have handrails.  Any raised decks and porches should have  guardrails on the edges.  Have any leaves, snow, or ice cleared regularly.  Use sand or salt on walking paths during winter.  Clean up any spills in your garage right away. This includes oil or grease spills. What can I do in the bathroom?  Use night lights.  Install grab bars by the toilet and in the tub and shower. Do not use towel bars as grab bars.  Use non-skid mats or decals in the tub or shower.  If you need to sit down in the shower, use a plastic, non-slip stool.  Keep the floor dry. Clean up any water that spills on the floor as soon as it happens.  Remove soap buildup in the tub or shower regularly.  Attach bath mats securely with double-sided non-slip rug tape.  Do not have throw rugs and other things on the floor that can make you trip. What can I do in the bedroom?  Use night lights.  Make sure that you have a light by your bed that is easy to reach.  Do not use any sheets or blankets that are too big for your bed. They should not hang down onto the floor.  Have a firm chair that has side  arms. You can use this for support while you get dressed.  Do not have throw rugs and other things on the floor that can make you trip. What can I do in the kitchen?  Clean up any spills right away.  Avoid walking on wet floors.  Keep items that you use a lot in easy-to-reach places.  If you need to reach something above you, use a strong step stool that has a grab bar.  Keep electrical cords out of the way.  Do not use floor polish or wax that makes floors slippery. If you must use wax, use non-skid floor wax.  Do not have throw rugs and other things on the floor that can make you trip. What can I do with my stairs?  Do not leave any items on the stairs.  Make sure that there are handrails on both sides of the stairs and use them. Fix handrails that are broken or loose. Make sure that handrails are as long as the stairways.  Check any carpeting to make sure that  it is firmly attached to the stairs. Fix any carpet that is loose or worn.  Avoid having throw rugs at the top or bottom of the stairs. If you do have throw rugs, attach them to the floor with carpet tape.  Make sure that you have a light switch at the top of the stairs and the bottom of the stairs. If you do not have them, ask someone to add them for you. What else can I do to help prevent falls?  Wear shoes that:  Do not have high heels.  Have rubber bottoms.  Are comfortable and fit you well.  Are closed at the toe. Do not wear sandals.  If you use a stepladder:  Make sure that it is fully opened. Do not climb a closed stepladder.  Make sure that both sides of the stepladder are locked into place.  Ask someone to hold it for you, if possible.  Clearly mark and make sure that you can see:  Any grab bars or handrails.  First and last steps.  Where the edge of each step is.  Use tools that help you move around (mobility aids) if they are needed. These include:  Canes.  Walkers.  Scooters.  Crutches.  Turn on the lights when you go into a dark area. Replace any light bulbs as soon as they burn out.  Set up your furniture so you have a clear path. Avoid moving your furniture around.  If any of your floors are uneven, fix them.  If there are any pets around you, be aware of where they are.  Review your medicines with your doctor. Some medicines can make you feel dizzy. This can increase your chance of falling. Ask your doctor what other things that you can do to help prevent falls. This information is not intended to replace advice given to you by your health care provider. Make sure you discuss any questions you have with your health care provider. Document Released: 12/31/2008 Document Revised: 08/12/2015 Document Reviewed: 04/10/2014 Elsevier Interactive Patient Education  2017 [employer].

## 2020-03-08 ENCOUNTER — Telehealth: Payer: Self-pay | Admitting: Oncology

## 2020-03-08 ENCOUNTER — Inpatient Hospital Stay: Payer: Medicare Other | Attending: Oncology

## 2020-03-08 NOTE — Telephone Encounter (Signed)
Called pt per 12/20 sch msg - no answer. Left message for patient to call back to reschedule missed appt.

## 2020-03-15 ENCOUNTER — Inpatient Hospital Stay: Payer: Medicare Other

## 2020-03-16 ENCOUNTER — Telehealth: Payer: Self-pay

## 2020-03-16 NOTE — Telephone Encounter (Signed)
See below Probably needs appt?

## 2020-03-16 NOTE — Telephone Encounter (Signed)
Patient would like to know if there is something that she can get to help with her right foot?  Stated that her arch dropped in the back of her right foot. CB# [phone].  Please advise.  Thank you.

## 2020-03-17 NOTE — Telephone Encounter (Signed)
Yeah sounds like she needs appt.  Thanks

## 2020-03-17 NOTE — Telephone Encounter (Signed)
Can we make appt for patient, she needs to be seen for this Nothing we can treat over the phone

## 2020-03-18 ENCOUNTER — Telehealth: Payer: Self-pay | Admitting: Surgical

## 2020-03-18 NOTE — Telephone Encounter (Signed)
Called pt 1X and left a vm to set appt for right leg pains. Will try back later.

## 2020-03-19 ENCOUNTER — Other Ambulatory Visit: Payer: Self-pay | Admitting: Family Medicine

## 2020-03-24 ENCOUNTER — Ambulatory Visit: Payer: Self-pay

## 2020-03-24 ENCOUNTER — Ambulatory Visit (INDEPENDENT_AMBULATORY_CARE_PROVIDER_SITE_OTHER): Payer: Medicare Other | Admitting: Orthopedic Surgery

## 2020-03-24 DIAGNOSIS — R002 Palpitations: Secondary | ICD-10-CM | POA: Diagnosis not present

## 2020-03-24 DIAGNOSIS — M25571 Pain in right ankle and joints of right foot: Secondary | ICD-10-CM

## 2020-03-25 ENCOUNTER — Other Ambulatory Visit: Payer: Self-pay

## 2020-03-26 ENCOUNTER — Ambulatory Visit (INDEPENDENT_AMBULATORY_CARE_PROVIDER_SITE_OTHER): Payer: Medicare Other | Admitting: Family Medicine

## 2020-03-26 ENCOUNTER — Encounter: Payer: Self-pay | Admitting: Family Medicine

## 2020-03-26 VITALS — BP 132/86 | HR 82 | Temp 98.1°F | Ht 66.0 in | Wt 291.0 lb

## 2020-03-26 DIAGNOSIS — J449 Chronic obstructive pulmonary disease, unspecified: Secondary | ICD-10-CM | POA: Diagnosis not present

## 2020-03-26 DIAGNOSIS — Z86718 Personal history of other venous thrombosis and embolism: Secondary | ICD-10-CM

## 2020-03-26 DIAGNOSIS — I10 Essential (primary) hypertension: Secondary | ICD-10-CM

## 2020-03-26 DIAGNOSIS — M79671 Pain in right foot: Secondary | ICD-10-CM

## 2020-03-26 DIAGNOSIS — E1169 Type 2 diabetes mellitus with other specified complication: Secondary | ICD-10-CM | POA: Diagnosis not present

## 2020-03-26 DIAGNOSIS — I5032 Chronic diastolic (congestive) heart failure: Secondary | ICD-10-CM | POA: Diagnosis not present

## 2020-03-26 MED ORDER — FUROSEMIDE 20 MG PO TABS: 20.0000 mg | ORAL_TABLET | Freq: Every day | ORAL | 3 refills | Status: DC

## 2020-03-26 NOTE — Progress Notes (Signed)
Subjective:    Patient ID: [name], female    DOB: 05-07-1955, 65 y.o.   MRN: [id number]  No chief complaint on file.   HPI Patient is a 65 year old female with past medical history significant for HTN, chronic diastolic CHF, h/o b/l DVT, OSA on CPAP, DM 2, vitreal macular adhesion b/l eyes obesity, hydronephrosis R kidney, depression fibromyalgia, lupus anticoagulant positive, R adrenal mass food was seen today for follow-up.  Pt endorses continued fatigue and SOB.  Using 2 doses of medicine in nebulizer twice daily.  Has not made appointment for follow-up with pulmonology.  Feels like she is retaining fluid.  States may urinate 2-3 x/day.  Drinking 3-4 bottles of water/day and 1 20 oz soda.  Endorses trying to wear CPAP more consistently.  States settings were recently adjusted to decrease the pressure.  Sleeping on 3 pillows at night and not walking as much 2/2 shortness of breath.  Pt states [place] [facility] is sending her a new BP cuff.  Pt interested in setting up pill pack for her prescription.  Pt has a walking boot on right foot after seeing Ortho for arthritis, gout, heel spur.  Patient has yet to take her medication.  Takes meds every morning at 10 AM.  Past Medical History:  Diagnosis Date  . Adrenal mass ([place]) 03/226   Benign  . Arthritis    knees/multiple orthopedic conditons; lower back  . Asthma    per pt  . 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 Depression   . Diabetes mellitus    120s usually fasting -  dx more than 10 yrs ago  . Dizziness, nonspecific   . DVT (deep venous thrombosis) (HCC)    Recurrent  . Fibromyalgia   . GERD (gastroesophageal reflux disease)   . Heart murmur   . History of blood transfusion   . History of cocaine abuse ([place])    Remote history   . Hypertension    takes meds daily  . Hypothyroidism   . Lupus  Anticoagulant Positive   . On home oxygen therapy    at night  . Shortness of breath    exertion or lying flat  . Sleep apnea    2l of oxygen at night (as of 12/6, she used to)    Allergies  Allergen Reactions  . Lisinopril Swelling    THROAT SWELLING Pt states her throat started to "close up" after being on it for "so long."  . Corticosteroids Rash    States she developed a rash on her tongue after a steroid injection in her bac    ROS General: Denies fever, chills, night sweats, changes in weight, changes in appetite  + bloating HEENT: Denies headaches, ear pain, changes in vision, rhinorrhea, sore throat CV: Denies CP, palpitations +SOB, orthopnea Pulm: Denies cough  + SOB, wheezing GI: Denies abdominal pain, nausea, vomiting, diarrhea, constipation GU: Denies dysuria, hematuria, frequency, vaginal discharge Msk: Denies muscle cramps, joint pains + right foot pain Neuro: Denies weakness, numbness, tingling Skin: Denies rashes, bruising Psych: Denies depression, anxiety, hallucinations      Objective:    Blood pressure 132/86, pulse 82, temperature 98.1 F (36.7 C), temperature source Oral, height 5\' 6"  (1.676 m), weight 291 lb (132 kg), SpO2 97 %.   Gen. Pleasant, well-nourished, in no distress, normal affect   HEENT: [place]/AT, face symmetric, conjunctiva clear,  no scleral icterus, PERRLA, EOMI, nares patent without drainage Lungs: no accessory muscle use, CTAB, no wheezes or rales Cardiovascular: RRR, no m/r/g, no peripheral edema Musculoskeletal: No deformities, no cyanosis or clubbing, normal tone Neuro:  A&Ox3, CN II-XII intact, ambulating with a cane and walking boot on right foot Skin:  Warm, no lesions/ rash   Wt Readings from Last 3 Encounters:  03/26/20 291 lb (132 kg)  03/05/20 291 lb 4 oz (132.1 kg)  02/27/20 297 lb (134.7 kg)    Lab Results  Component Value Date   WBC 5.2 02/02/2020   HGB 12.4 02/02/2020   HCT 38.4 02/02/2020   PLT 308 02/02/2020    GLUCOSE 177 (H) 03/05/2020   CHOL 194 02/02/2020   TRIG 196 (H) 02/02/2020   HDL 76 02/02/2020   LDLCALC 89 02/02/2020   ALT 21 02/02/2020   AST 18 02/02/2020   NA 139 03/05/2020   K 3.6 03/05/2020   CL 100 03/05/2020   CREATININE 0.77 03/05/2020   BUN 13 03/05/2020   CO2 23 03/05/2020   TSH 6.50 (H) 02/02/2020   INR 2.0 (H) 02/06/2020   HGBA1C 8.8 (H) 02/02/2020    Assessment/Plan:  Controlled type 2 diabetes mellitus with other specified complication, without long-term current use of insulin (HCC) -Hemoglobin A1c 8.8% on 02/02/2020 -Continue current medications including Invokana 100 mg -Discussed the importance of lifestyle modifications -Diabetic retinopathy exam negative on 09/20/812  Chronic diastolic CHF (congestive heart failure) (HCC)  -NYHA class II -Discussed changing hydrochlorothiazide 12.5 mg to Lasix 20 mg.  Delete -d/c HCTZ 12.5 mg daily -Start Lasix 20 mg daily.  Will likely need to adjust dose -Continue -Discussed the importance of CPAP compliance - Plan: furosemide (LASIX) 20 MG tablet  Chronic obstructive pulmonary disease, unspecified COPD type (HCC) -Stable -Continue albuterol nebulizer treatment as needed -Discussed importance of follow-up with pulmonology -Continue to monitor  Essential hypertension -Controlled -Discussed changing HCTZ 12.5 mg to Lasix 20 mg daily -We will d/c HCTZ 12.5. -Continue other medications -Continue follow-up with cardiology - Plan: furosemide (LASIX) 20 MG tablet  Right foot pain -2/2 heel spur, arthritis, gout? -Continue follow-up with Ortho -Continue walking boot -Discussed elevation and other supportive care  History of DVT -On chronic anticoagulation -Continue Coumadin 10 mg Monday, Wednesday, Friday, 5 mg all other days  F/u in 1 month, sooner if needed  [name], MD

## 2020-03-26 NOTE — Patient Instructions (Addendum)
At today's visit we discussed changing your hydrochlorothiazide 12.5 mg to Lasix 20 mg.  Lasix is another "water pill "or a medicine that will help with swelling, your blood pressure, and your heart failure.  You are to stop taking hydrochlorothiazide 12.5 mg.  A prescription for the Lasix was sent to your pharmacy.  Heart Failure, Diagnosis  Heart failure means that your heart is not able to pump blood in the right way. This makes it hard for your body to work well. Heart failure is usually a long-term (chronic) condition. You must take good care of yourself and follow your treatment plan from your doctor. What are the causes? This condition may be caused by:  High blood pressure.  Build up of cholesterol and fat in the arteries.  Heart attack. This injures the heart muscle.  Heart valves that do not open and close properly.  Damage of the heart muscle. This is also called cardiomyopathy.  Lung disease.  Abnormal heart rhythms. What increases the risk? The risk of heart failure goes up as a person ages. This condition is also more likely to develop in people who:  Are overweight.  Are female.  Smoke or chew tobacco.  Abuse alcohol or illegal drugs.  Have taken medicines that can damage the heart.  Have diabetes.  Have abnormal heart rhythms.  Have thyroid problems.  Have low blood counts (anemia). What are the signs or symptoms? Symptoms of this condition include:  Shortness of breath.  Coughing.  Swelling of the feet, ankles, legs, or belly.  Losing weight for no reason.  Trouble breathing.  Waking from sleep because of the need to sit up and get more air.  Rapid heartbeat.  Being very tired.  Feeling dizzy, or feeling like you may pass out (faint).  Having no desire to eat.  Feeling like you may vomit (nauseous).  Peeing (urinating) more at night.  Feeling confused. How is this treated?     This condition may be treated with:  Medicines.  These can be given to treat blood pressure and to make the heart muscles stronger.  Changes in your daily life. These may include eating a healthy diet, staying at a healthy body weight, quitting tobacco and illegal drug use, or doing exercises.  Surgery. Surgery can be done to open blocked valves, or to put devices in the heart, such as pacemakers.  A donor heart (heart transplant). You will receive a healthy heart from a donor. Follow these instructions at home:  Treat other conditions as told by your doctor. These may include high blood pressure, diabetes, thyroid disease, or abnormal heart rhythms.  Learn as much as you can about heart failure.  Get support as you need it.  Keep all follow-up visits as told by your doctor. This is important. Summary  Heart failure means that your heart is not able to pump blood in the right way.  This condition is caused by high blood pressure, heart attack, or damage of the heart muscle.  Symptoms of this condition include shortness of breath and swelling of the feet, ankles, legs, or belly. You may also feel very tired or feel like you may vomit.  You may be treated with medicines, surgery, or changes in your daily life.  Treat other health conditions as told by your doctor. This information is not intended to replace advice given to you by your health care provider. Make sure you discuss any questions you have with your health care provider. Document  Revised: 05/24/2018 Document Reviewed: 05/24/2018 Elsevier Patient Education  2020 [employer]  Chronic Obstructive Pulmonary Disease Chronic obstructive pulmonary disease (COPD) is a long-term (chronic) lung problem. When you have COPD, it is hard for air to get in and out of your lungs. Usually the condition gets worse over time, and your lungs will never return to normal. There are things you can do to keep yourself as healthy as possible.  Your doctor may treat your condition  with: ? Medicines. ? Oxygen. ? Lung surgery.  Your doctor may also recommend: ? Rehabilitation. This includes steps to make your body work better. It may involve a team of specialists. ? Quitting smoking, if you smoke. ? Exercise and changes to your diet. ? Comfort measures (palliative care). Follow these instructions at home: Medicines  Take over-the-counter and prescription medicines only as told by your doctor.  Talk to your doctor before taking any cough or allergy medicines. You may need to avoid medicines that cause your lungs to be dry. Lifestyle  If you smoke, stop. Smoking makes the problem worse. If you need help quitting, ask your doctor.  Avoid being around things that make your breathing worse. This may include smoke, chemicals, and fumes.  Stay active, but remember to rest as well.  Learn and use tips on how to relax.  Make sure you get enough sleep. Most adults need at least 7 hours of sleep every night.  Eat healthy foods. Eat smaller meals more often. Rest before meals. Controlled breathing Learn and use tips on how to control your breathing as told by your doctor. Try:  Breathing in (inhaling) through your nose for 1 second. Then, pucker your lips and breath out (exhale) through your lips for 2 seconds.  Putting one hand on your belly (abdomen). Breathe in slowly through your nose for 1 second. Your hand on your belly should move out. Pucker your lips and breathe out slowly through your lips. Your hand on your belly should move in as you breathe out.  Controlled coughing Learn and use controlled coughing to clear mucus from your lungs. Follow these steps: 1. Lean your head a little forward. 2. Breathe in deeply. 3. Try to hold your breath for 3 seconds. 4. Keep your mouth slightly open while coughing 2 times. 5. Spit any mucus out into a tissue. 6. Rest and do the steps again 1 or 2 times as needed. General instructions  Make sure you get all the shots  (vaccines) that your doctor recommends. Ask your doctor about a flu shot and a pneumonia shot.  Use oxygen therapy and pulmonary rehabilitation if told by your doctor. If you need home oxygen therapy, ask your doctor if you should buy a tool to measure your oxygen level (oximeter).  Make a COPD action plan with your doctor. This helps you to know what to do if you feel worse than usual.  Manage any other conditions you have as told by your doctor.  Avoid going outside when it is very hot, cold, or humid.  Avoid people who have a sickness you can catch (contagious).  Keep all follow-up visits as told by your doctor. This is important. Contact a doctor if:  You cough up more mucus than usual.  There is a change in the color or thickness of the mucus.  It is harder to breathe than usual.  Your breathing is faster than usual.  You have trouble sleeping.  You need to use your medicines more often  than usual.  You have trouble doing your normal activities such as getting dressed or walking around the house. Get help right away if:  You have shortness of breath while resting.  You have shortness of breath that stops you from: ? Being able to talk. ? Doing normal activities.  Your chest hurts for longer than 5 minutes.  Your skin color is more blue than usual.  Your pulse oximeter shows that you have low oxygen for longer than 5 minutes.  You have a fever.  You feel too tired to breathe normally. Summary  Chronic obstructive pulmonary disease (COPD) is a long-term lung problem.  The way your lungs work will never return to normal. Usually the condition gets worse over time. There are things you can do to keep yourself as healthy as possible.  Take over-the-counter and prescription medicines only as told by your doctor.  If you smoke, stop. Smoking makes the problem worse. This information is not intended to replace advice given to you by your health care provider. Make  sure you discuss any questions you have with your health care provider. Document Revised: 02/16/2017 Document Reviewed: 04/10/2016 Elsevier Patient Education  2020 [employer].

## 2020-03-27 ENCOUNTER — Encounter: Payer: Self-pay | Admitting: Orthopedic Surgery

## 2020-03-27 NOTE — Progress Notes (Signed)
Office Visit Note   Patient: [name]           Date of Birth: 03/07/1956           MRN: [id number] Visit Date: 03/24/2020 Requested by: Billie Ruddy, MD [place],  [place] [id number] PCP: Billie Ruddy, MD  Subjective: Chief Complaint  Patient presents with  . Right Foot - Pain  . Right Ankle - Pain    HPI: [name] is a 65 y.o. female who presents to the office complaining of right ankle pain.  Patient notes that she went to bed feeling well on 03/12/2020 but awoke on 03/13/2020 with severe pain in the posterior right ankle.  She notes some swelling and extreme pain with weightbearing.  She ambulates with a cane.  She notes most of her pain is in the posterior lateral aspect of the right ankle.  Denies any history of injury.  No improvement or worsening since onset of pain.  She takes Tylenol as needed for pain control without significant relief.  No history of surgery to the right foot or ankle.  Denies any history of prior issue with similar pain.  Denies any left-sided symptoms.  She has history of DVT and is on Coumadin.  She does have a history of gout with last episode in 2008.  She is not taking any allopurinol..                ROS: All systems reviewed are negative as they relate to the chief complaint within the history of present illness.  Patient denies fevers or chills.  Assessment & Plan: Visit Diagnoses:  1. Pain in right ankle and joints of right foot     Plan: Patient is a 65 year old female presents complaining of acute onset right ankle pain.  Localizes pain to the posterior aspect of the right ankle.  No injury and she has significant difficulty weightbearing on the ankle.  When she stands she is only putting weight on the ball of her foot and none on her heel.  Seems she is having severe pain.  Plan to place patient in fracture boot as well as given patient topical Pennsaid sample which she will use.  Follow-up in 2 weeks  for clinical recheck.  Order MRI at that time if no improvement.  Patient agreed with this plan.  Follow-up in 2 weeks.  Radiographs negative today.  Follow-Up Instructions: No follow-ups on file.   Orders:  Orders Placed This Encounter  Procedures  . XR Foot Complete Right  . XR Ankle Complete Right   No orders of the defined types were placed in this encounter.     Procedures: No procedures performed   Clinical Data: No additional findings.  Objective: Vital Signs: There were no vitals taken for this visit.  Physical Exam:  Constitutional: Patient appears well-developed HEENT:  Head: Normocephalic Eyes:EOM are normal Neck: Normal range of motion Cardiovascular: Normal rate Pulmonary/chest: Effort normal Neurologic: Patient is alert Skin: Skin is warm Psychiatric: Patient has normal mood and affect  Ortho Exam: Ortho exam demonstrates right ankle with tenderness over the retrocalcaneal space of the right ankle.  No tenderness over the medial or lateral malleoli of the right ankle.  Active dorsiflexion, plantarflexion, inversion, eversion are intact.  No tenderness over the calcaneus, fifth metatarsal base, insertion of the posterior tibialis tendon, ATFL, CFL, deltoid ligament.  No calf tenderness.  Negative Homans' sign.  No tenderness throughout the forefoot  or midfoot.  Thompson test negative.  No tenderness over the Achilles tendon.  There is some mild swelling and warmth of the retrocalcaneal space of the right ankle compared with contralateral side.  Specialty Comments:  No specialty comments available.  Imaging: No results found.   PMFS History: Patient Active Problem List   Diagnosis Date Noted  . Chronic heart failure with preserved ejection fraction ([facility]) 02/27/2020  . Obesity, diabetes, and hypertension syndrome ([facility]) 02/27/2020  . 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 Pseudophakia of both eyes 02/09/2020  . Chronic diastolic CHF (congestive  heart failure) ([facility]) 03/12/2019  . Acute respiratory failure with hypoxia ([facility]) 03/09/2019  . Hypernatremia 03/09/2019  . Suspected COVID-19 virus infection 03/09/2019  . Acute encephalopathy 03/09/2019  . Right sided weakness 02/20/2019  . Thyroid mass 02/27/2017  . Chronic pain of right knee 05/24/2016  . Effusion, right knee 04/28/2016  . Presence of right artificial knee joint 04/28/2016  . Abnormal LFTs   . Decreased sensation   . Paresthesia 06/12/2014  . Obstruction of kidney 04/24/2014  . Diabetes type 2, uncontrolled ([facility]) 04/24/2014  . Chronic anticoagulation 04/24/2014  . Crack cocaine use 04/24/2014  . Depression 04/24/2014  . Fibromyalgia 04/24/2014  . COPD (chronic obstructive pulmonary disease) ([facility]) 04/24/2014  . Obstructive sleep apnea on CPAP 04/24/2014  . Lupus anticoagulant positive 04/24/2014  . Adrenal mass, right ([facility]) 04/24/2014  . Right kidney mass 04/24/2014  . Hydronephrosis of right kidney 04/24/2014  . Supratherapeutic INR 04/24/2014  . Renal mass, right 04/24/2014  . Right lower quadrant abdominal pain   . Morbid obesity ([facility]) 08/21/2013  . Unspecified constipation 08/19/2013  . COPD with acute exacerbation ([facility]) 08/17/2013  . Morbid obesity with BMI of 45.0-49.9, adult ([facility]) 01/05/2012  . Bilateral deep vein thromboses ([facility]) 12/13/2011  . Abdominal pain 11/05/2011  . Hypokalemia 11/05/2011  . Sciatica 11/05/2011  . Diabetes mellitus type 2 in obese ([facility]) 11/05/2011  . HTN (hypertension) 11/05/2011   Past Medical History:  Diagnosis Date  . Adrenal mass ([facility]) 03/226   Benign  . Arthritis    knees/multiple orthopedic conditons; lower back  . Asthma    per pt  . 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 Depression   . Diabetes mellitus    120s usually fasting -  dx more than 10 yrs ago  . Dizziness, nonspecific   . DVT  (deep venous thrombosis) ([facility])    Recurrent  . Fibromyalgia   . GERD (gastroesophageal reflux disease)   . Heart murmur   . History of blood transfusion   . History of cocaine abuse ([facility])    Remote history   . Hypertension    takes meds daily  . Hypothyroidism   . Lupus Anticoagulant Positive   . On home oxygen therapy    at night  . Shortness of breath    exertion or lying flat  . Sleep apnea    2l of oxygen at night (as of 12/6, she used to)    Family History  Problem Relation Age of Onset  . Hypertension Mother   . Diabetes Mother   . Cancer Mother        Pancreatic  . Hypertension Father   . Heart failure Father     Past Surgical History:  Procedure Laterality Date  . ABDOMINAL HYSTERECTOMY  1989   Fibroids  .  ANTERIOR LUMBAR FUSION  01/03/2012   Procedure: ANTERIOR LUMBAR FUSION 1 LEVEL;  Surgeon: Winfield Cunas, MD;  Location: [facility];  Service: Neurosurgery;  Laterality: N/A;  Lumbar Four-Five Anterior Lumbar Interbody Fusion with Instrumentation  . BACK SURGERY     cervical spine---disk disease  . CARPAL TUNNEL RELEASE     Bilateral  . CESAREAN SECTION    . CHOLECYSTECTOMY    . CYSTOSCOPY/RETROGRADE/URETEROSCOPY/STONE EXTRACTION WITH BASKET Right 04/26/2014   Procedure: CYSTOSCOPY/ RIGHT RETROGRADE/ RIGHT URETEROSCOPY/URETERAL AND RENAL PELVIS BIOPSY;  Surgeon: Alexis Frock, MD;  Location: [facility];  Service: Urology;  Laterality: Right;  . gallstones removed    . 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   bilateral  . right elbow surgery    . THYROID LOBECTOMY Right 02/27/2017  . THYROID LOBECTOMY Right 02/27/2017   Procedure: RIGHT THYROID LOBECTOMY;  Surgeon: Erroll Luna, MD;  Location: [facility];  Service: General;  Laterality: Right;  . TUBAL LIGATION     Social History   Occupational History  . Not on file  Tobacco Use  . Smoking status: Never Smoker  . Smokeless tobacco: Never Used  Vaping Use  . Vaping Use: Never used  Substance and Sexual  Activity  . Alcohol use: No    Alcohol/week: 0.0 standard drinks    Comment: beer in past   . Drug use: Yes    Types: Cocaine    Comment: quit 2003-rehab progam in 2005  . Sexual activity: Never

## 2020-03-29 DIAGNOSIS — J449 Chronic obstructive pulmonary disease, unspecified: Secondary | ICD-10-CM | POA: Diagnosis not present

## 2020-04-02 ENCOUNTER — Ambulatory Visit: Payer: Medicare Other | Admitting: Internal Medicine

## 2020-04-02 ENCOUNTER — Telehealth: Payer: Self-pay | Admitting: Family Medicine

## 2020-04-02 NOTE — Progress Notes (Signed)
  Chronic Care Management   Outreach Note  04/02/2020 Name: [name] MRN: [id number] DOB: May 13, 1955  Referred by: Billie Ruddy, MD Reason for referral : No chief complaint on file.   An unsuccessful telephone outreach was attempted today. The patient was referred to the pharmacist for assistance with care management and care coordination.   Follow Up Plan:   Carley Perdue [employer] Scheduler

## 2020-04-07 ENCOUNTER — Encounter (HOSPITAL_COMMUNITY): Payer: Self-pay

## 2020-04-07 ENCOUNTER — Emergency Department (HOSPITAL_COMMUNITY)
Admission: EM | Admit: 2020-04-07 | Discharge: 2020-04-08 | Disposition: A | Discharge status: Home / Self Care | Payer: Medicare Other | Attending: Emergency Medicine | Admitting: Emergency Medicine

## 2020-04-07 ENCOUNTER — Other Ambulatory Visit: Payer: Self-pay

## 2020-04-07 ENCOUNTER — Ambulatory Visit (HOSPITAL_COMMUNITY)
Admission: EM | Admit: 2020-04-07 | Discharge: 2020-04-07 | Disposition: A | Discharge status: Other Acute Inpatient Hospital | Payer: Medicare Other | Attending: Family Medicine | Admitting: Family Medicine

## 2020-04-07 ENCOUNTER — Emergency Department (HOSPITAL_COMMUNITY): Payer: Medicare Other

## 2020-04-07 ENCOUNTER — Encounter (HOSPITAL_COMMUNITY): Payer: Self-pay | Admitting: Emergency Medicine

## 2020-04-07 DIAGNOSIS — I5032 Chronic diastolic (congestive) heart failure: Secondary | ICD-10-CM | POA: Diagnosis not present

## 2020-04-07 DIAGNOSIS — Z8616 Personal history of COVID-19: Secondary | ICD-10-CM | POA: Diagnosis not present

## 2020-04-07 DIAGNOSIS — I517 Cardiomegaly: Secondary | ICD-10-CM | POA: Diagnosis not present

## 2020-04-07 DIAGNOSIS — I82409 Acute embolism and thrombosis of unspecified deep veins of unspecified lower extremity: Secondary | ICD-10-CM | POA: Insufficient documentation

## 2020-04-07 DIAGNOSIS — R0902 Hypoxemia: Secondary | ICD-10-CM | POA: Diagnosis not present

## 2020-04-07 DIAGNOSIS — J441 Chronic obstructive pulmonary disease with (acute) exacerbation: Secondary | ICD-10-CM | POA: Diagnosis not present

## 2020-04-07 DIAGNOSIS — E039 Hypothyroidism, unspecified: Secondary | ICD-10-CM | POA: Insufficient documentation

## 2020-04-07 DIAGNOSIS — Z7901 Long term (current) use of anticoagulants: Secondary | ICD-10-CM | POA: Insufficient documentation

## 2020-04-07 DIAGNOSIS — R072 Precordial pain: Secondary | ICD-10-CM | POA: Diagnosis not present

## 2020-04-07 DIAGNOSIS — J45909 Unspecified asthma, uncomplicated: Secondary | ICD-10-CM | POA: Diagnosis not present

## 2020-04-07 DIAGNOSIS — Z96653 Presence of artificial knee joint, bilateral: Secondary | ICD-10-CM | POA: Diagnosis not present

## 2020-04-07 DIAGNOSIS — I10 Essential (primary) hypertension: Secondary | ICD-10-CM | POA: Diagnosis not present

## 2020-04-07 DIAGNOSIS — I110 Hypertensive heart disease with heart failure: Secondary | ICD-10-CM | POA: Diagnosis not present

## 2020-04-07 DIAGNOSIS — Z7984 Long term (current) use of oral hypoglycemic drugs: Secondary | ICD-10-CM | POA: Insufficient documentation

## 2020-04-07 DIAGNOSIS — R079 Chest pain, unspecified: Secondary | ICD-10-CM | POA: Diagnosis not present

## 2020-04-07 DIAGNOSIS — R0789 Other chest pain: Secondary | ICD-10-CM | POA: Diagnosis not present

## 2020-04-07 DIAGNOSIS — Z79899 Other long term (current) drug therapy: Secondary | ICD-10-CM | POA: Insufficient documentation

## 2020-04-07 DIAGNOSIS — E1165 Type 2 diabetes mellitus with hyperglycemia: Secondary | ICD-10-CM | POA: Diagnosis not present

## 2020-04-07 DIAGNOSIS — Z7951 Long term (current) use of inhaled steroids: Secondary | ICD-10-CM | POA: Insufficient documentation

## 2020-04-07 DIAGNOSIS — E1169 Type 2 diabetes mellitus with other specified complication: Secondary | ICD-10-CM | POA: Insufficient documentation

## 2020-04-07 DIAGNOSIS — Z743 Need for continuous supervision: Secondary | ICD-10-CM | POA: Diagnosis not present

## 2020-04-07 NOTE — ED Triage Notes (Signed)
Pt here from [facility] with c/o cp over the last 2 days was unable to be seen for it due to the weather

## 2020-04-07 NOTE — ED Triage Notes (Signed)
Patient c/o LFT sided chest pain x  3 days.   Patient endorses symptoms started upon waking.   Patient has used a heating pad and Tylenol w/ no relief of pain.   Patient has history of COPD.

## 2020-04-07 NOTE — ED Provider Notes (Signed)
Chest pain for three days per nursing staff with active chest pain. ekg without acute findings here today. Significant medical history: copd, dm, chf, cleep apnea, dvt, clotting disorder, history of cocaine abuse. Recommend further acs evaluation in the er now. Patient had driven herself here tonight, therefore EMS called for monitored transport to the ER now. Noted BP of 193/103 on arrival here tonight.     Zigmund Gottron, NP 04/07/20 1720

## 2020-04-08 DIAGNOSIS — R072 Precordial pain: Secondary | ICD-10-CM | POA: Diagnosis not present

## 2020-04-08 LAB — CBC
HCT: 39.2 % (ref 36.0–46.0)
Hemoglobin: 12.2 g/dL (ref 12.0–15.0)
MCH: 26.2 pg (ref 26.0–34.0)
MCHC: 31.1 g/dL (ref 30.0–36.0)
MCV: 84.1 fL (ref 80.0–100.0)
Platelets: 350 10*3/uL (ref 150–400)
RBC: 4.66 MIL/uL (ref 3.87–5.11)
RDW: 17.9 % — ABNORMAL HIGH (ref 11.5–15.5)
WBC: 8.8 10*3/uL (ref 4.0–10.5)
nRBC: 0 % (ref 0.0–0.2)

## 2020-04-08 LAB — BASIC METABOLIC PANEL
Anion gap: 14 (ref 5–15)
BUN: 16 mg/dL (ref 8–23)
CO2: 27 mmol/L (ref 22–32)
Calcium: 8.2 mg/dL — ABNORMAL LOW (ref 8.9–10.3)
Chloride: 98 mmol/L (ref 98–111)
Creatinine, Ser: 0.89 mg/dL (ref 0.44–1.00)
GFR, Estimated: >60 mL/min (ref >60)
Glucose, Bld: 179 mg/dL — ABNORMAL HIGH (ref 70–99)
Potassium: 3 mmol/L — ABNORMAL LOW (ref 3.5–5.1)
Sodium: 139 mmol/L (ref 135–145)

## 2020-04-08 LAB — TROPONIN I (HIGH SENSITIVITY)
Troponin I (High Sensitivity): 13 ng/L (ref <18)
Troponin I (High Sensitivity): 12 ng/L (ref <18)

## 2020-04-08 LAB — PROTIME-INR
INR: 1.9 — ABNORMAL HIGH (ref 0.8–1.2)
Prothrombin Time: 20.8 seconds — ABNORMAL HIGH (ref 11.4–15.2)

## 2020-04-08 MED ORDER — OXYCODONE-ACETAMINOPHEN 5-325 MG PO TABS
1.0000 | ORAL_TABLET | Freq: Once | ORAL | Status: AC
  Administered 2020-04-08: 1 via ORAL
  Filled 2020-04-08: qty 1

## 2020-04-08 MED ORDER — OXYCODONE HCL 5 MG PO TABS: 5.0000 mg | ORAL_TABLET | Freq: Four times a day (QID) | ORAL | 0 refills | Status: DC | PRN

## 2020-04-08 MED ORDER — POTASSIUM CHLORIDE CRYS ER 20 MEQ PO TBCR
40.0000 meq | EXTENDED_RELEASE_TABLET | Freq: Once | ORAL | Status: AC
  Administered 2020-04-08: 40 meq via ORAL
  Filled 2020-04-08: qty 2

## 2020-04-08 NOTE — Discharge Instructions (Signed)
Please read and follow all provided instructions.  Your diagnoses today include:  1. Precordial pain     Tests performed today include:  An EKG of your heart  A chest x-ray  Cardiac enzymes - a blood test for heart muscle damage  Blood counts and electrolytes  INR (coumadin level) - was 1.9 today  Vital signs. See below for your results today.   Medications prescribed:   Oxycodone - narcotic pain medication for severe pain  DO NOT drive or perform any activities that require you to be awake and alert because this medicine can make you drowsy.   Continue home Tylenol as directed on packaging for mild pain.  Take any prescribed medications only as directed.  Follow-up instructions: Please follow-up with your primary care provider as soon as you can for further evaluation of your symptoms.   Return instructions:  SEEK IMMEDIATE MEDICAL ATTENTION IF:  You have severe chest pain, especially if the pain is crushing or pressure-like and spreads to the arms, back, neck, or jaw, or if you have sweating, nausea (feeling sick to your stomach), or shortness of breath. THIS IS AN EMERGENCY. Don't wait to see if the pain will go away. Get medical help at once. Call 911 or [phone] (operator). DO NOT drive yourself to the hospital.   Your chest pain gets worse and does not go away with rest.   You have an attack of chest pain lasting longer than usual, despite rest and treatment with the medications your caregiver has prescribed.   You wake from sleep with chest pain or shortness of breath.  You feel dizzy or faint.  You have chest pain not typical of your usual pain for which you originally saw your caregiver.   You have any other emergent concerns regarding your health.  Additional Information: Chest pain comes from many different causes. Your caregiver has diagnosed you as having chest pain that is not specific for one problem, but does not require admission.  You are at low risk for  an acute heart condition or other serious illness.   Your vital signs today were: BP (!) 163/110   Pulse 91   Temp 99 F (37.2 C) (Oral)   Resp 19   Ht 5\' 6"  (1.676 m)   Wt 132 kg   SpO2 94%   BMI 46.97 kg/m  If your blood pressure (BP) was elevated above 135/85 this visit, please have this repeated by your doctor within one month. --------------

## 2020-04-08 NOTE — ED Notes (Signed)
C/o left anterior chest over left breast states when she raises her left arm makes pain worse and is worse with movement.

## 2020-04-08 NOTE — ED Provider Notes (Signed)
[facility] DEPARTMENT Provider Note   CSN: [id number] Arrival date & time: 04/07/20  1756     History Chief Complaint  Patient presents with  . Chest Pain    [name] is a 65 y.o. female.  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  Patient denies injury at onset.  She describes a sharp pain in the the left chest that is worse with movement of her left arm and "pushing my breast to the side".  Patient has her baseline wheezing but states that she has not felt like "messing with the machine" and has not felt like using more nebulizer treatments.  No new lower extremity pain or swelling beyond her typical fibromyalgia pain.  She denies fever or cough.  Pain does not radiate into the arm, neck or back.  Symptoms are not worse with exertion.  She denies vomiting or diaphoresis.  She does have a history of hypertension but denies previous stents or heart attacks.  Remote cocaine use.  She does not smoke.  States that she has not missed any doses of warfarin.        Past Medical History:  Diagnosis Date  . Adrenal mass ([place]) 03/226   Benign  . Arthritis    knees/multiple orthopedic conditons; lower back  . Asthma    per pt  . 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 Depression   . Diabetes mellitus    120s usually fasting -  dx more than 10 yrs ago  . Dizziness, nonspecific   . DVT (deep venous thrombosis) (HCC)    Recurrent  . Fibromyalgia   . GERD (gastroesophageal reflux disease)   . Heart murmur   . History of blood transfusion   . History of cocaine abuse ([place])    Remote history   . Hypertension    takes meds daily  . Hypothyroidism   .  Lupus Anticoagulant Positive   . On home oxygen therapy    at night  . Shortness of breath    exertion or lying flat  . Sleep apnea    2l of oxygen at night (as of 12/6, she used to)    Patient Active Problem List   Diagnosis Date Noted  . Chronic heart failure with preserved ejection fraction ([place]) 02/27/2020  . Obesity, diabetes, and hypertension syndrome ([place]) 02/27/2020  . 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 Pseudophakia of both eyes 02/09/2020  . Chronic diastolic CHF (congestive heart failure) ([place]) 03/12/2019  . Acute respiratory failure with hypoxia ([place]) 03/09/2019  . Hypernatremia 03/09/2019  . Suspected COVID-19 virus infection 03/09/2019  . Acute encephalopathy 03/09/2019  . Right sided weakness 02/20/2019  . Thyroid mass 02/27/2017  . Chronic pain of right knee 05/24/2016  . Effusion, right knee 04/28/2016  . Presence of right artificial knee joint 04/28/2016  . Abnormal LFTs   . Decreased sensation   . Paresthesia 06/12/2014  . Obstruction of kidney 04/24/2014  . Diabetes type 2, uncontrolled ([place]) 04/24/2014  . Chronic anticoagulation 04/24/2014  . Crack cocaine use 04/24/2014  . Depression 04/24/2014  . Fibromyalgia 04/24/2014  . COPD (chronic obstructive pulmonary disease) ([place]) 04/24/2014  . Obstructive sleep apnea on CPAP 04/24/2014  .  Lupus anticoagulant positive 04/24/2014  . Adrenal mass, right ([place]) 04/24/2014  . Right kidney mass 04/24/2014  . Hydronephrosis of right kidney 04/24/2014  . Supratherapeutic INR 04/24/2014  . Renal mass, right 04/24/2014  . Right lower quadrant abdominal pain   . Morbid obesity ([place]) 08/21/2013  . Unspecified constipation 08/19/2013  . COPD with acute exacerbation ([place]) 08/17/2013  . Morbid obesity with BMI of 45.0-49.9, adult ([place]) 01/05/2012  . Bilateral deep vein thromboses ([place]) 12/13/2011  . Abdominal pain 11/05/2011  . Hypokalemia 11/05/2011  . Sciatica 11/05/2011  . Diabetes mellitus type 2  in obese ([place]) 11/05/2011  . HTN (hypertension) 11/05/2011    Past Surgical History:  Procedure Laterality Date  . ABDOMINAL HYSTERECTOMY  1989   Fibroids  . ANTERIOR LUMBAR FUSION  01/03/2012   Procedure: ANTERIOR LUMBAR FUSION 1 LEVEL;  Surgeon: Winfield Cunas, MD;  Location: [facility];  Service: Neurosurgery;  Laterality: N/A;  Lumbar Four-Five Anterior Lumbar Interbody Fusion with Instrumentation  . BACK SURGERY     cervical spine---disk disease  . CARPAL TUNNEL RELEASE     Bilateral  . CESAREAN SECTION    . CHOLECYSTECTOMY    . CYSTOSCOPY/RETROGRADE/URETEROSCOPY/STONE EXTRACTION WITH BASKET Right 04/26/2014   Procedure: CYSTOSCOPY/ RIGHT RETROGRADE/ RIGHT URETEROSCOPY/URETERAL AND RENAL PELVIS BIOPSY;  Surgeon: Alexis Frock, MD;  Location: [facility];  Service: Urology;  Laterality: Right;  . gallstones removed    . 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   bilateral  . right elbow surgery    . THYROID LOBECTOMY Right 02/27/2017  . THYROID LOBECTOMY Right 02/27/2017   Procedure: RIGHT THYROID LOBECTOMY;  Surgeon: Erroll Luna, MD;  Location: [facility];  Service: General;  Laterality: Right;  . TUBAL LIGATION       OB History   No obstetric history on file.     Family History  Problem Relation Age of Onset  . Hypertension Mother   . Diabetes Mother   . Cancer Mother        Pancreatic  . Hypertension Father   . Heart failure Father     Social History   Tobacco Use  . Smoking status: Never Smoker  . Smokeless tobacco: Never Used  Vaping Use  . Vaping Use: Never used  Substance Use Topics  . Alcohol use: No    Alcohol/week: 0.0 standard drinks    Comment: beer in past   . Drug use: Yes    Types: Cocaine    Comment: quit 2003-rehab progam in 2005    Home Medications Prior to Admission medications   Medication Sig Start Date End Date Taking? Authorizing Provider  ACCU-CHEK GUIDE test strip USE UP TO FOUR TIMES DAILY AS DIRECTED. 08/12/19   Billie Ruddy, MD   Accu-Chek Softclix Lancets lancets USE UP TO FOUR TIMES DAILY AS DIRECTED. 08/12/19   Billie Ruddy, MD  albuterol (PROVENTIL HFA;VENTOLIN HFA) 108 (90 BASE) MCG/ACT inhaler Inhale 2 puffs into the lungs every 4 (four) hours as needed for shortness of breath.    [provider]  albuterol (PROVENTIL) (2.5 MG/3ML) 0.083% nebulizer solution USE 1 VIAL IN NEBULIZER EVERY 6 HOURS - and as needed 03/22/20   Billie Ruddy, MD  amLODipine (NORVASC) 5 MG tablet Take 5 mg by mouth daily.    [provider]  ANORO ELLIPTA 62.5-25 MCG/INH AEPB Inhale 1 puff into the lungs daily.  05/22/18   [provider]  benzonatate (TESSALON) 100 MG capsule Take 100 mg by mouth 3 (  three) times daily as needed. 03/20/19   [provider]  blood glucose meter kit and supplies KIT Dispense based on patient and insurance preference. Use up to four times daily as directed. (FOR ICD-9 250.00, 250.01). 07/16/19   Billie Ruddy, MD  canagliflozin [facility]) 100 MG TABS tablet Take 1 tablet (100 mg total) by mouth daily before breakfast. 02/27/20   Chandrasekhar, [name] A, MD  CARTIA XT 240 MG 24 hr capsule Take 240 mg by mouth daily. 06/17/14   [provider]  cloNIDine (CATAPRES) 0.1 MG tablet Take 0.1 mg by mouth 2 (two) times daily.    [provider]  diclofenac sodium (VOLTAREN) 1 % GEL Apply 2 g topically daily as needed (fibromyalgia pain).    [provider]  DULoxetine (CYMBALTA) 20 MG capsule Take 20 mg by mouth daily. 01/11/18   [provider]  DULoxetine (CYMBALTA) 60 MG capsule Take 60 mg by mouth at bedtime.     [provider]  fluticasone (FLONASE) 50 MCG/ACT nasal spray Place 1 spray into both nostrils daily.  09/04/18   [provider]  furosemide (LASIX) 20 MG tablet Take 1 tablet (20 mg total) by mouth daily. 03/26/20   Billie Ruddy, MD  gabapentin (NEURONTIN) 300 MG capsule Take 300 mg by mouth at bedtime.      [provider]  levocetirizine (XYZAL) 5 MG tablet Take 5 mg by mouth at bedtime. 02/11/19   [provider]  LINZESS 145 MCG CAPS capsule Take 145 mcg by mouth every other day.  08/08/17   [provider]  meclizine (ANTIVERT) 25 MG tablet Take 1 tablet (25 mg total) by mouth 3 (three) times daily as needed for dizziness. 09/29/16   Charlesetta Shanks, MD  metFORMIN (GLUCOPHAGE) 500 MG tablet Take 500 mg by mouth at bedtime.    [provider]  metoCLOPramide (REGLAN) 10 MG tablet Take 1 tablet (10 mg total) by mouth every 6 (six) hours as needed for nausea (or headache). 1/91/47   Delora Fuel, MD  omeprazole (PRILOSEC) 20 MG capsule Take 20 mg by mouth 2 (two) times daily.    [provider]  pravastatin (PRAVACHOL) 40 MG tablet Take 1 tablet (40 mg total) by mouth every evening. 02/06/20   Billie Ruddy, MD  QUEtiapine (SEROQUEL) 50 MG tablet Take 50 mg by mouth at bedtime.     [provider]  REXULTI 2 MG TABS tablet Take 2 mg by mouth daily. 01/28/19   [provider]  SYMBICORT 160-4.5 MCG/ACT inhaler SMARTSIG:2 Puff(s) By Mouth Twice Daily 07/24/19   [provider]  tiZANidine (ZANAFLEX) 4 MG tablet Take 4 mg by mouth 2 (two) times daily as needed for muscle spasms. 01/29/18   [provider]  warfarin (COUMADIN) 10 MG tablet TAKE 1 TABLET BY MOUTH ON MONDAY, WEDNESDAY, AND FRIDAY 11/03/19   Ladell Pier, MD  warfarin (COUMADIN) 5 MG tablet TAKE 1 TABLET BY MOUTH DAILY EXCEPT ON MONDAY, WEDNESDAY, AND FRIDAY OR AS DIRECTED. 03/05/20   Ladell Pier, MD  zonisamide (ZONEGRAN) 100 MG capsule Take 1 capsule by mouth as directed. Take 1 tablet in the AM and 2 tablets in the PM 01/07/20   [provider]    Allergies    Lisinopril and Corticosteroids  Review of Systems   Review of Systems  Constitutional: Negative for diaphoresis and fever.  Eyes: Negative for redness.  Respiratory: Negative for  cough and shortness of breath.  Cardiovascular: Positive for chest pain. Negative for palpitations and leg swelling.  Gastrointestinal: Negative for abdominal pain, nausea and vomiting.  Genitourinary: Negative for dysuria.  Musculoskeletal: Negative for back pain and neck pain.  Skin: Negative for rash.  Neurological: Negative for syncope and light-headedness.  Psychiatric/Behavioral: The patient is not nervous/anxious.     Physical Exam Updated Vital Signs BP (!) 163/110   Pulse 91   Temp 99 F (37.2 C) (Oral)   Resp 19   Ht 5' 6"  (1.676 m)   Wt 132 kg   SpO2 94%   BMI 46.97 kg/m   Physical Exam Vitals and nursing note reviewed.  Constitutional:      Appearance: She is well-developed and well-nourished. She is not diaphoretic.  HENT:     Head: Normocephalic and atraumatic.     Mouth/Throat:     Mouth: Mucous membranes are normal. Mucous membranes are not dry.  Eyes:     Conjunctiva/sclera: Conjunctivae normal.  Neck:     Vascular: Normal carotid pulses. No carotid bruit or JVD.     Trachea: Trachea normal. No tracheal deviation.  Cardiovascular:     Rate and Rhythm: Normal rate and regular rhythm.     Pulses: Intact distal pulses. No decreased pulses.     Heart sounds: Normal heart sounds, S1 normal and S2 normal. No murmur heard.   Pulmonary:     Effort: Pulmonary effort is normal. No respiratory distress.     Breath sounds: No wheezing.  Chest:     Chest wall: Tenderness present.    Abdominal:     General: Bowel sounds are normal. Aorta is normal.     Palpations: Abdomen is soft.     Tenderness: There is no abdominal tenderness. There is no guarding or rebound.  Musculoskeletal:        General: Normal range of motion.     Cervical back: Normal range of motion and neck supple. No muscular tenderness.     Right lower leg: No edema.     Left lower leg: No edema.  Skin:    General: Skin is warm and dry.     Coloration: Skin is not pale.     Nails: There  is no cyanosis.  Neurological:     Mental Status: She is alert.  Psychiatric:        Mood and Affect: Mood and affect normal.     ED Results / Procedures / Treatments   Labs (all labs ordered are listed, but only abnormal results are displayed) Labs Reviewed  BASIC METABOLIC PANEL - Abnormal; Notable for the following components:      Result Value   Potassium 3.0 (*)    Glucose, Bld 179 (*)    Calcium 8.2 (*)    All other components within normal limits  CBC - Abnormal; Notable for the following components:   RDW 17.9 (*)    All other components within normal limits  PROTIME-INR - Abnormal; Notable for the following components:   Prothrombin Time 20.8 (*)    INR 1.9 (*)    All other components within normal limits  TROPONIN I (HIGH SENSITIVITY)  TROPONIN I (HIGH SENSITIVITY)    ED ECG REPORT   Date: 04/08/2020  Rate: 92  Rhythm: normal sinus rhythm  QRS Axis: normal  Intervals: normal  ST/T Wave abnormalities: normal  Conduction Disutrbances:none  Narrative Interpretation:   Old EKG Reviewed: unchanged from 02/02/20  I have personally reviewed the EKG tracing and agree with  the computerized printout as noted.  Radiology DG Chest 2 View  Result Date: 04/07/2020 CLINICAL DATA:  Chest pain EXAM: CHEST - 2 VIEW COMPARISON:  02/02/2020 FINDINGS: Surgical hardware in the cervical spine. No focal opacity or pleural effusion. Borderline to mild cardiomegaly. No pneumothorax IMPRESSION: Borderline to mild cardiomegaly. Electronically Signed   By: Donavan Foil M.D.   On: 04/07/2020 22:14    Procedures Procedures (including critical care time)  Medications Ordered in ED Medications - No data to display  ED Course  I have reviewed the triage vital signs and the nursing notes.  Pertinent labs & imaging results that were available during my care of the patient were reviewed by me and considered in my medical decision making (see chart for details).  Patient seen and  examined.  Reviewed work-up to this point including EKG and chest x-ray.  Patient with chest pain that is reproducible and atypical for ACS.  That being said, she does have several risk factors for both blood clots and ACS.  Unfortunately, she has had extended wait time in the emergency department.  Will check second troponin now as well as an INR.  I feel that her pain is most likely from the chest wall and she will be able to be discharged home when work-up is complete.  Vital signs reviewed and are as follows: BP (!) 163/110   Pulse 91   Temp 99 F (37.2 C) (Oral)   Resp 19   Ht 5' 6"  (1.676 m)   Wt 132 kg   SpO2 94%   BMI 46.97 kg/m   11:58 AM Troponin 13 > 12. INR 1.9.  On reexam, symptoms unchanged.  Patient continues to have reproducible chest wall tenderness.  Plan to treat as chest wall pain.  Discussed results with patient.  She is comfortable discharged home at this time.  Encouraged her to contact her primary care doctor next week for recheck.  We discussed signs and symptoms to return.  Patient was counseled to return with severe chest pain, especially if the pain is crushing or pressure-like and spreads to the arms, back, neck, or jaw, or if they have sweating, nausea, or shortness of breath with the pain. They were encouraged to call 911 with these symptoms.   Patient counseled on use of narcotic pain medications. Counseled not to combine these medications with others containing tylenol. Urged not to drink alcohol, drive, or perform any other activities that requires focus while taking these medications. The patient verbalizes understanding and agrees with the plan.     MDM Rules/Calculators/A&P                          Patient with reproducible chest wall tenderness in setting of cardiac risk factors and previous DVTs.  Patient is on warfarin.  INR is 1.9 today.  Symptoms are very atypical for PE.  Patient is not hypoxic, tachycardic or short of breath.  Symptoms are also  very atypical in nature for ACS.  Troponin is normal and stable over extended ED stay with an EKG that does not have ischemic changes.  We will treat as chest wall pain.  We will have to avoid NSAIDs due to anticoagulation.  Encourage PCP follow-up.  Patient previously on tramadol per [facility] substance reporting database.    Final Clinical Impression(s) / ED Diagnoses Final diagnoses:  Precordial pain    Rx / DC Orders ED Discharge Orders    None  Carlisle Cater, PA-C 04/08/20 1203    Davonna Belling, MD 04/08/20 [phone]

## 2020-04-09 ENCOUNTER — Telehealth: Payer: Self-pay | Admitting: Oncology

## 2020-04-09 ENCOUNTER — Inpatient Hospital Stay: Payer: Medicare Other

## 2020-04-09 NOTE — Telephone Encounter (Signed)
Rescheduled lab appointment per 1/21 schedule message. Patient is aware of changes.

## 2020-04-12 ENCOUNTER — Telehealth: Payer: Self-pay | Admitting: *Deleted

## 2020-04-12 ENCOUNTER — Inpatient Hospital Stay: Payer: Medicare Other | Attending: Oncology

## 2020-04-12 ENCOUNTER — Other Ambulatory Visit: Payer: Self-pay

## 2020-04-12 DIAGNOSIS — Z7901 Long term (current) use of anticoagulants: Secondary | ICD-10-CM | POA: Diagnosis not present

## 2020-04-12 DIAGNOSIS — I82403 Acute embolism and thrombosis of unspecified deep veins of lower extremity, bilateral: Secondary | ICD-10-CM

## 2020-04-12 DIAGNOSIS — Z86718 Personal history of other venous thrombosis and embolism: Secondary | ICD-10-CM | POA: Insufficient documentation

## 2020-04-12 LAB — PROTIME-INR
INR: 1.9 — ABNORMAL HIGH (ref 0.8–1.2)
Prothrombin Time: 21.4 seconds — ABNORMAL HIGH (ref 11.4–15.2)

## 2020-04-12 NOTE — Telephone Encounter (Signed)
-----   Message from Ladell Pier, MD sent at 04/12/2020  9:59 AM EST ----- Please call patient, continue Coumadin same dose, repeat PT/INR in 1 month

## 2020-04-12 NOTE — Telephone Encounter (Signed)
LM with note below. Requested return call to verify receipt of message

## 2020-04-13 ENCOUNTER — Encounter: Payer: Self-pay | Admitting: Internal Medicine

## 2020-04-13 ENCOUNTER — Ambulatory Visit (INDEPENDENT_AMBULATORY_CARE_PROVIDER_SITE_OTHER): Payer: Medicare Other | Admitting: Internal Medicine

## 2020-04-13 VITALS — BP 112/80 | HR 102 | Ht 66.0 in | Wt 289.0 lb

## 2020-04-13 DIAGNOSIS — R002 Palpitations: Secondary | ICD-10-CM | POA: Insufficient documentation

## 2020-04-13 DIAGNOSIS — E1169 Type 2 diabetes mellitus with other specified complication: Secondary | ICD-10-CM | POA: Diagnosis not present

## 2020-04-13 DIAGNOSIS — E1159 Type 2 diabetes mellitus with other circulatory complications: Secondary | ICD-10-CM | POA: Diagnosis not present

## 2020-04-13 DIAGNOSIS — G4733 Obstructive sleep apnea (adult) (pediatric): Secondary | ICD-10-CM | POA: Diagnosis not present

## 2020-04-13 DIAGNOSIS — I5032 Chronic diastolic (congestive) heart failure: Secondary | ICD-10-CM

## 2020-04-13 DIAGNOSIS — I152 Hypertension secondary to endocrine disorders: Secondary | ICD-10-CM

## 2020-04-13 DIAGNOSIS — M797 Fibromyalgia: Secondary | ICD-10-CM | POA: Diagnosis not present

## 2020-04-13 DIAGNOSIS — J449 Chronic obstructive pulmonary disease, unspecified: Secondary | ICD-10-CM

## 2020-04-13 DIAGNOSIS — R0609 Other forms of dyspnea: Secondary | ICD-10-CM

## 2020-04-13 DIAGNOSIS — R0600 Dyspnea, unspecified: Secondary | ICD-10-CM | POA: Diagnosis not present

## 2020-04-13 DIAGNOSIS — E669 Obesity, unspecified: Secondary | ICD-10-CM

## 2020-04-13 MED ORDER — METOPROLOL SUCCINATE ER 100 MG PO TB24: 100.0000 mg | ORAL_TABLET | ORAL | 0 refills | Status: DC

## 2020-04-13 NOTE — Patient Instructions (Addendum)
Medication Instructions:  Your physician recommends that you continue on your current medications as directed. Please refer to the Current Medication list given to you today.  *If you need a refill on your cardiac medications before your next appointment, please call your pharmacy*   Lab Work: Your physician recommends that you return for lab work 1 week before your CT  If you have labs (blood work) drawn today and your tests are completely normal, you will receive your results only by: Marland Kitchen MyChart Message (if you have MyChart) OR . A paper copy in the mail If you have any lab test that is abnormal or we need to change your treatment, we will call you to review the results.   Testing/Procedures: Non-Cardiac CT Angiography (CTA), is a special type of CT scan that uses a computer to produce multi-dimensional views of major blood vessels throughout the body. In CT angiography, a contrast material is injected through an IV to help visualize the blood vessels  Your physician has requested that you have an echocardiogram. Echocardiography is a painless test that uses sound waves to create images of your heart. It provides your doctor with information about the size and shape of your heart and how well your heart's chambers and valves are working. This procedure takes approximately one hour. There are no restrictions for this procedure.   Follow-Up: At [facility], you and your health needs are our priority.  As part of our continuing mission to provide you with exceptional heart care, we have created designated Provider Care Teams.  These Care Teams include your primary Cardiologist (physician) and Advanced Practice Providers (APPs -  Physician Assistants and Nurse Practitioners) who all work together to provide you with the care you need, when you need it.  We recommend signing up for the patient portal called "MyChart".  Sign up information is provided on this After Visit Summary.  MyChart is  used to connect with patients for Virtual Visits (Telemedicine).  Patients are able to view lab/test results, encounter notes, upcoming appointments, etc.  Non-urgent messages can be sent to your provider as well.   To learn more about what you can do with MyChart, go to [email or web address].    Your next appointment:   2-3 month(s)  The format for your next appointment:   In Person  Provider:   You may see Dr. [name] or one of the following Advanced Practice Providers on your designated Care Team:    Melina Copa, PA-C  Ermalinda Barrios, PA-C    Other Instructions Your cardiac CT will be scheduled at one of the below locations:   [facility] [address] [place], [place] [address] [phone]   If scheduled at [facility], please arrive at the [facility] main entrance of [facility] 30 minutes prior to test start time. Proceed to the [facility] Radiology Department (first floor) to check-in and test prep. Please follow these instructions carefully (unless otherwise directed):   On the Night Before the Test: . Be sure to Drink plenty of water. . Do not consume any caffeinated/decaffeinated beverages or chocolate 12 hours prior to your test. . Do not take any antihistamines 12 hours prior to your test.  On the Day of the Test: . Drink plenty of water. Do not drink any water within one hour of the test. . Do not eat any food 4 hours prior to the test. . You may take your regular medications prior to the test.  .  Take metoprolol (Lopressor) two hours prior to test. . HOLD Furosemide morning of the test. . FEMALES- please wear underwire-free bra if available      After the Test: . Drink plenty of water. . After receiving IV contrast, you may experience a mild flushed feeling. This is normal. . On occasion, you may experience a mild rash up to 24 hours after the test. This is not dangerous. If this occurs, you can take Benadryl  25 mg and increase your fluid intake. . If you experience trouble breathing, this can be serious. If it is severe call 911 IMMEDIATELY. If it is mild, please call our office. . If you take any of these medications: Glipizide/Metformin, Avandament, Glucavance, please do not take 48 hours after completing test unless otherwise instructed.   Once we have confirmed authorization from your insurance company, we will call you to set up a date and time for your test. Based on how quickly your insurance processes prior authorizations requests, please allow up to 4 weeks to be contacted for scheduling your Cardiac CT appointment. Be advised that routine Cardiac CT appointments could be scheduled as many as 8 weeks after your provider has ordered it.  For non-scheduling related questions, please contact the cardiac imaging nurse navigator should you have any questions/concerns: [name], Cardiac Imaging Nurse Navigator Burley Saver, Interim Cardiac Imaging Nurse [place] [facility] Services Direct Office Dial: [phone]   For scheduling needs, including cancellations and rescheduling, please call [place], [phone].

## 2020-04-13 NOTE — Progress Notes (Signed)
Cardiology Office Note:    Date:  04/13/2020   ID:  [name], DOB 11/01/1955, MRN [id number]  PCP:  Banks, Shannon R, MD  [facility] Cardiologist:    MD [facility] Electrophysiologist:  None   CC: Follow up HFpEF  History of Present Illness:    [name] is a 65 y.o. 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  In interim had ziopatch with no concerning findings.  Patient notes that she is doing Ok.  Since last visit notes improvement in her breathing but still had some DOE.  Relevant interval testing or therapy include restarting Invokana.  Had 04/07/20 ED visit for chest pain with normal novel troponins.   Notes that she has chest pain when moving her left arm but not with activity.  Its constant since her ED visit and does not improve with rest.  Still has DOE but no resting SOB.  No weight gain or leg swelling.  No further palpitations or syncope.  Ambulatory blood pressure not done but she now has a cuff..   Past Medical History:  Diagnosis Date  . Adrenal mass (HCC) 03/226   Benign  . Arthritis    knees/multiple orthopedic conditons; lower back  . Asthma    per pt  . 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 Depression   . Diabetes mellitus    120s usually fasting -  dx more than 10 yrs ago  . Dizziness, nonspecific   . DVT (deep venous thrombosis) (HCC)    Recurrent  . Fibromyalgia   . GERD (gastroesophageal reflux disease)   . Heart murmur   . History of blood transfusion   . History of cocaine abuse (HCC)    Remote history   . Hypertension    takes meds daily  . Hypothyroidism   . Lupus Anticoagulant Positive   . On home oxygen  therapy    at night  . Shortness of breath    exertion or lying flat  . Sleep apnea    2l of oxygen at night (as of 12/6, she used to)    Past Surgical History:  Procedure Laterality Date  . ABDOMINAL HYSTERECTOMY  1989   Fibroids  . ANTERIOR LUMBAR FUSION  01/03/2012   Procedure: ANTERIOR LUMBAR FUSION 1 LEVEL;  Surgeon: Kyle L Cabbell, MD;  Location: [facility];  Service: Neurosurgery;  Laterality: N/A;  Lumbar Four-Five Anterior Lumbar Interbody Fusion with Instrumentation  . BACK SURGERY     cervical spine---disk disease  . CARPAL TUNNEL RELEASE     Bilateral  . CESAREAN SECTION    . CHOLECYSTECTOMY    . CYSTOSCOPY/RETROGRADE/URETEROSCOPY/STONE EXTRACTION WITH BASKET Right 04/26/2014   Procedure: CYSTOSCOPY/ RIGHT RETROGRADE/ RIGHT URETEROSCOPY/URETERAL AND RENAL PELVIS BIOPSY;  Surgeon: Theodore Manny, MD;  Location: [facility];  Service: Urology;  Laterality: Right;  . gallstones removed    . 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   bilateral  . right elbow surgery    . THYROID LOBECTOMY Right 02/27/2017  . THYROID LOBECTOMY Right 02/27/2017   Procedure: RIGHT THYROID LOBECTOMY;  Surgeon: Cornett, Thomas, MD;  Location: [facility];  Service: General;  Laterality: Right;  . TUBAL LIGATION        Cardiology Office Note:    Date:  04/13/2020   ID:  [name], DOB 11/01/1955, MRN [id number]  PCP:  Banks, Shannon R, MD  [facility] Cardiologist:    MD [facility] Electrophysiologist:  None   CC: Follow up HFpEF  History of Present Illness:    [name] is a 65 y.o. 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  In interim had ziopatch with no concerning findings.  Patient notes that she is doing Ok.  Since last visit notes improvement in her breathing but still had some DOE.  Relevant interval testing or therapy include restarting Invokana.  Had 04/07/20 ED visit for chest pain with normal novel troponins.   Notes that she has chest pain when moving her left arm but not with activity.  Its constant since her ED visit and does not improve with rest.  Still has DOE but no resting SOB.  No weight gain or leg swelling.  No further palpitations or syncope.  Ambulatory blood pressure not done but she now has a cuff..   Past Medical History:  Diagnosis Date  . Adrenal mass (HCC) 03/226   Benign  . Arthritis    knees/multiple orthopedic conditons; lower back  . Asthma    per pt  . 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 Depression   . Diabetes mellitus    120s usually fasting -  dx more than 10 yrs ago  . Dizziness, nonspecific   . DVT (deep venous thrombosis) (HCC)    Recurrent  . Fibromyalgia   . GERD (gastroesophageal reflux disease)   . Heart murmur   . History of blood transfusion   . History of cocaine abuse (HCC)    Remote history   . Hypertension    takes meds daily  . Hypothyroidism   . Lupus Anticoagulant Positive   . On home oxygen  therapy    at night  . Shortness of breath    exertion or lying flat  . Sleep apnea    2l of oxygen at night (as of 12/6, she used to)    Past Surgical History:  Procedure Laterality Date  . ABDOMINAL HYSTERECTOMY  1989   Fibroids  . ANTERIOR LUMBAR FUSION  01/03/2012   Procedure: ANTERIOR LUMBAR FUSION 1 LEVEL;  Surgeon: Kyle L Cabbell, MD;  Location: [facility];  Service: Neurosurgery;  Laterality: N/A;  Lumbar Four-Five Anterior Lumbar Interbody Fusion with Instrumentation  . BACK SURGERY     cervical spine---disk disease  . CARPAL TUNNEL RELEASE     Bilateral  . CESAREAN SECTION    . CHOLECYSTECTOMY    . CYSTOSCOPY/RETROGRADE/URETEROSCOPY/STONE EXTRACTION WITH BASKET Right 04/26/2014   Procedure: CYSTOSCOPY/ RIGHT RETROGRADE/ RIGHT URETEROSCOPY/URETERAL AND RENAL PELVIS BIOPSY;  Surgeon: Theodore Manny, MD;  Location: [facility];  Service: Urology;  Laterality: Right;  . gallstones removed    . 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   bilateral  . right elbow surgery    . THYROID LOBECTOMY Right 02/27/2017  . THYROID LOBECTOMY Right 02/27/2017   Procedure: RIGHT THYROID LOBECTOMY;  Surgeon: Cornett, Thomas, MD;  [facility]: [facility];  Service: General;  Laterality: Right;  . TUBAL LIGATION        Cardiology Office Note:    Date:  04/13/2020   ID:  [name], DOB 11/01/1955, MRN [id number]  PCP:  Banks, Shannon R, MD  [facility] Cardiologist:    MD [facility] Electrophysiologist:  None   CC: Follow up HFpEF  History of Present Illness:    [name] is a 65 y.o. 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  In interim had ziopatch with no concerning findings.  Patient notes that she is doing Ok.  Since last visit notes improvement in her breathing but still had some DOE.  Relevant interval testing or therapy include restarting Invokana.  Had 04/07/20 ED visit for chest pain with normal novel troponins.   Notes that she has chest pain when moving her left arm but not with activity.  Its constant since her ED visit and does not improve with rest.  Still has DOE but no resting SOB.  No weight gain or leg swelling.  No further palpitations or syncope.  Ambulatory blood pressure not done but she now has a cuff..   Past Medical History:  Diagnosis Date  . Adrenal mass (HCC) 03/226   Benign  . Arthritis    knees/multiple orthopedic conditons; lower back  . Asthma    per pt  . 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 Depression   . Diabetes mellitus    120s usually fasting -  dx more than 10 yrs ago  . Dizziness, nonspecific   . DVT (deep venous thrombosis) (HCC)    Recurrent  . Fibromyalgia   . GERD (gastroesophageal reflux disease)   . Heart murmur   . History of blood transfusion   . History of cocaine abuse (HCC)    Remote history   . Hypertension    takes meds daily  . Hypothyroidism   . Lupus Anticoagulant Positive   . On home oxygen  therapy    at night  . Shortness of breath    exertion or lying flat  . Sleep apnea    2l of oxygen at night (as of 12/6, she used to)    Past Surgical History:  Procedure Laterality Date  . ABDOMINAL HYSTERECTOMY  1989   Fibroids  . ANTERIOR LUMBAR FUSION  01/03/2012   Procedure: ANTERIOR LUMBAR FUSION 1 LEVEL;  Surgeon: Kyle L Cabbell, MD;  Location: [facility];  Service: Neurosurgery;  Laterality: N/A;  Lumbar Four-Five Anterior Lumbar Interbody Fusion with Instrumentation  . BACK SURGERY     cervical spine---disk disease  . CARPAL TUNNEL RELEASE     Bilateral  . CESAREAN SECTION    . CHOLECYSTECTOMY    . CYSTOSCOPY/RETROGRADE/URETEROSCOPY/STONE EXTRACTION WITH BASKET Right 04/26/2014   Procedure: CYSTOSCOPY/ RIGHT RETROGRADE/ RIGHT URETEROSCOPY/URETERAL AND RENAL PELVIS BIOPSY;  Surgeon: Theodore Manny, MD;  Location: [facility];  Service: Urology;  Laterality: Right;  . gallstones removed    . 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   bilateral  . right elbow surgery    . THYROID LOBECTOMY Right 02/27/2017  . THYROID LOBECTOMY Right 02/27/2017   Procedure: RIGHT THYROID LOBECTOMY;  Surgeon: Cornett, Thomas, MD;  [facility]: [facility];  Service: General;  Laterality: Right;  . TUBAL LIGATION        Cardiology Office Note:    Date:  04/13/2020   ID:  [name], DOB 11/01/1955, MRN [id number]  PCP:  Banks, Shannon R, MD  [facility] Cardiologist:    MD [facility] Electrophysiologist:  None   CC: Follow up HFpEF  History of Present Illness:    [name] is a 65 y.o. 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  In interim had ziopatch with no concerning findings.  Patient notes that she is doing Ok.  Since last visit notes improvement in her breathing but still had some DOE.  Relevant interval testing or therapy include restarting Invokana.  Had 04/07/20 ED visit for chest pain with normal novel troponins.   Notes that she has chest pain when moving her left arm but not with activity.  Its constant since her ED visit and does not improve with rest.  Still has DOE but no resting SOB.  No weight gain or leg swelling.  No further palpitations or syncope.  Ambulatory blood pressure not done but she now has a cuff..   Past Medical History:  Diagnosis Date  . Adrenal mass (HCC) 03/226   Benign  . Arthritis    knees/multiple orthopedic conditons; lower back  . Asthma    per pt  . 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 Depression   . Diabetes mellitus    120s usually fasting -  dx more than 10 yrs ago  . Dizziness, nonspecific   . DVT (deep venous thrombosis) (HCC)    Recurrent  . Fibromyalgia   . GERD (gastroesophageal reflux disease)   . Heart murmur   . History of blood transfusion   . History of cocaine abuse (HCC)    Remote history   . Hypertension    takes meds daily  . Hypothyroidism   . Lupus Anticoagulant Positive   . On home oxygen  therapy    at night  . Shortness of breath    exertion or lying flat  . Sleep apnea    2l of oxygen at night (as of 12/6, she used to)    Past Surgical History:  Procedure Laterality Date  . ABDOMINAL HYSTERECTOMY  1989   Fibroids  . ANTERIOR LUMBAR FUSION  01/03/2012   Procedure: ANTERIOR LUMBAR FUSION 1 LEVEL;  Surgeon: Kyle L Cabbell, MD;  Location: [facility];  Service: Neurosurgery;  Laterality: N/A;  Lumbar Four-Five Anterior Lumbar Interbody Fusion with Instrumentation  . BACK SURGERY     cervical spine---disk disease  . CARPAL TUNNEL RELEASE     Bilateral  . CESAREAN SECTION    . CHOLECYSTECTOMY    . CYSTOSCOPY/RETROGRADE/URETEROSCOPY/STONE EXTRACTION WITH BASKET Right 04/26/2014   Procedure: CYSTOSCOPY/ RIGHT RETROGRADE/ RIGHT URETEROSCOPY/URETERAL AND RENAL PELVIS BIOPSY;  Surgeon: Theodore Manny, MD;  Location: [facility];  Service: Urology;  Laterality: Right;  . gallstones removed    . 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   bilateral  . right elbow surgery    . THYROID LOBECTOMY Right 02/27/2017  . THYROID LOBECTOMY Right 02/27/2017   Procedure: RIGHT THYROID LOBECTOMY;  Surgeon: Cornett, Thomas, MD;  [facility]: [facility];  Service: General;  Laterality: Right;  . TUBAL LIGATION        Cardiology Office Note:    Date:  04/13/2020   ID:  [name], DOB 11/01/1955, MRN [id number]  PCP:  Banks, Shannon R, MD  [facility] Cardiologist:    MD [facility] Electrophysiologist:  None   CC: Follow up HFpEF  History of Present Illness:    [name] is a 65 y.o. 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  In interim had ziopatch with no concerning findings.  Patient notes that she is doing Ok.  Since last visit notes improvement in her breathing but still had some DOE.  Relevant interval testing or therapy include restarting Invokana.  Had 04/07/20 ED visit for chest pain with normal novel troponins.   Notes that she has chest pain when moving her left arm but not with activity.  Its constant since her ED visit and does not improve with rest.  Still has DOE but no resting SOB.  No weight gain or leg swelling.  No further palpitations or syncope.  Ambulatory blood pressure not done but she now has a cuff..   Past Medical History:  Diagnosis Date  . Adrenal mass (HCC) 03/226   Benign  . Arthritis    knees/multiple orthopedic conditons; lower back  . Asthma    per pt  . 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 Depression   . Diabetes mellitus    120s usually fasting -  dx more than 10 yrs ago  . Dizziness, nonspecific   . DVT (deep venous thrombosis) (HCC)    Recurrent  . Fibromyalgia   . GERD (gastroesophageal reflux disease)   . Heart murmur   . History of blood transfusion   . History of cocaine abuse (HCC)    Remote history   . Hypertension    takes meds daily  . Hypothyroidism   . Lupus Anticoagulant Positive   . On home oxygen  therapy    at night  . Shortness of breath    exertion or lying flat  . Sleep apnea    2l of oxygen at night (as of 12/6, she used to)    Past Surgical History:  Procedure Laterality Date  . ABDOMINAL HYSTERECTOMY  1989   Fibroids  . ANTERIOR LUMBAR FUSION  01/03/2012   Procedure: ANTERIOR LUMBAR FUSION 1 LEVEL;  Surgeon: Kyle L Cabbell, MD;  Location: [facility];  Service: Neurosurgery;  Laterality: N/A;  Lumbar Four-Five Anterior Lumbar Interbody Fusion with Instrumentation  . BACK SURGERY     cervical spine---disk disease  . CARPAL TUNNEL RELEASE     Bilateral  . CESAREAN SECTION    . CHOLECYSTECTOMY    . CYSTOSCOPY/RETROGRADE/URETEROSCOPY/STONE EXTRACTION WITH BASKET Right 04/26/2014   Procedure: CYSTOSCOPY/ RIGHT RETROGRADE/ RIGHT URETEROSCOPY/URETERAL AND RENAL PELVIS BIOPSY;  Surgeon: Theodore Manny, MD;  Location: [facility];  Service: Urology;  Laterality: Right;  . gallstones removed    . 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   bilateral  . right elbow surgery    . THYROID LOBECTOMY Right 02/27/2017  . THYROID LOBECTOMY Right 02/27/2017   Procedure: RIGHT THYROID LOBECTOMY;  Surgeon: Cornett, Thomas, MD;  Location: [facility];  Service: General;  Laterality: Right;  . TUBAL LIGATION      

## 2020-04-14 ENCOUNTER — Other Ambulatory Visit: Payer: Self-pay | Admitting: Oncology

## 2020-04-14 ENCOUNTER — Telehealth: Payer: Self-pay

## 2020-04-14 DIAGNOSIS — Z7901 Long term (current) use of anticoagulants: Secondary | ICD-10-CM

## 2020-04-14 DIAGNOSIS — I82403 Acute embolism and thrombosis of unspecified deep veins of lower extremity, bilateral: Secondary | ICD-10-CM

## 2020-04-14 NOTE — Telephone Encounter (Signed)
Contacted pt to verify how she is taking her coumadin. Verified Coumadin dose is 5 mg po daily except Monday, Wed and Friday. Taking Coumadin10 mg po on Monday , Wednesday and Friday. (as directed). Told pt coumadin was being refilled. Patient currently not using magic mouthwash.

## 2020-04-15 ENCOUNTER — Other Ambulatory Visit: Payer: Self-pay

## 2020-04-16 ENCOUNTER — Ambulatory Visit (INDEPENDENT_AMBULATORY_CARE_PROVIDER_SITE_OTHER): Payer: Medicare Other

## 2020-04-16 ENCOUNTER — Ambulatory Visit (INDEPENDENT_AMBULATORY_CARE_PROVIDER_SITE_OTHER): Payer: Medicare Other | Admitting: Family Medicine

## 2020-04-16 ENCOUNTER — Encounter: Payer: Self-pay | Admitting: Family Medicine

## 2020-04-16 VITALS — BP 134/86 | HR 87 | Temp 98.2°F | Wt 293.0 lb

## 2020-04-16 DIAGNOSIS — M19012 Primary osteoarthritis, left shoulder: Secondary | ICD-10-CM

## 2020-04-16 DIAGNOSIS — M25512 Pain in left shoulder: Secondary | ICD-10-CM | POA: Diagnosis not present

## 2020-04-16 NOTE — Progress Notes (Signed)
Subjective:    Patient ID: [name], female    DOB: [birth year], 65 y.o.   MRN: [id number]  No chief complaint on file.   HPI Patient is a 65 yo female with pmh sig for obesity, HTN, h/o DVT on chronic anticoagulation, chronic diastolic CHF, COPD, asthma, OSA, GERD, DM II, hypothyroidism, arthritis, fibromyalgia, h/o substance abuse, benign adrenal mass who was seen today for f/u.  Pt seen in ED on 1/18 for L sided CP thought to be MSK.  Pt has f/u with Cardiology next month.  Pt states the pain seems like it moved into her L shoulder.  Pain is sharp and causing limited ROM.  Pt tried heat and tylenol for the pain.  Pt takes cymbalta and gabapentin regularly.  Pt denies injury, heavy lifting, pushing, pulling.   Past Medical History:  Diagnosis Date  . Adrenal mass ([place]) 03/226   Benign  . Arthritis    knees/multiple orthopedic conditons; lower back  . Asthma    per pt  . 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 Depression   . Diabetes mellitus    120s usually fasting -  dx more than 10 yrs ago  . Dizziness, nonspecific   . DVT (deep venous thrombosis) (HCC)    Recurrent  . Fibromyalgia   . GERD (gastroesophageal reflux disease)   . Heart murmur   . History of blood transfusion   . History of cocaine abuse ([place])    Remote history   . Hypertension    takes meds daily  . Hypothyroidism   . Lupus Anticoagulant Positive   . On home oxygen therapy    at night  . Shortness of breath    exertion or lying flat  . Sleep apnea    2l of oxygen at night (as of 12/6, she used to)    Allergies  Allergen Reactions  . Lisinopril Swelling    THROAT SWELLING Pt states her throat started to "close up" after being on it for "so long."  . Corticosteroids Rash    States she developed a rash on her tongue after a steroid injection in her bac    ROS General: Denies  fever, chills, night sweats, changes in weight, changes in appetite HEENT: Denies headaches, ear pain, changes in vision, rhinorrhea, sore throat CV: Denies CP, palpitations, SOB, orthopnea Pulm: Denies SOB, cough, wheezing GI: Denies abdominal pain, nausea, vomiting, diarrhea, constipation GU: Denies dysuria, hematuria, frequency, vaginal discharge Msk: Denies muscle cramps +L shoulder Pain Neuro: Denies weakness, numbness, tingling Skin: Denies rashes, bruising Psych: Denies depression, anxiety, hallucinations      Objective:    Blood pressure 134/86, pulse 87, temperature 98.2 F (36.8 C), weight 293 lb (132.9 kg), SpO2 97 %.  Gen. Pleasant, well-nourished, in no distress, normal affect   HEENT: [place]/AT, face symmetric, conjunctiva clear, no scleral icterus, PERRLA, EOMI, nares patent without drainage Lungs: no accessory muscle use Cardiovascular: RRR, no m/r/g, no peripheral edema Musculoskeletal: TTP of L AC joint and lateral clavicle.  Limited active ROM of LUE, abduction 20 degrees.  Increased passive ROM.  Positive crossarm on L, Neer's and [place].  RUE normal.  No deformities, no cyanosis or clubbing, normal tone Neuro:  A&Ox3, CN II-XII intact, ambulating with a cane in R hand Skin:  Warm, no lesions/ rash   Wt Readings from Last 3 Encounters:  04/16/20 293  lb (132.9 kg)  04/13/20 289 lb (131.1 kg)  04/07/20 291 lb 0.1 oz (132 kg)    Lab Results  Component Value Date   WBC 8.8 04/07/2020   HGB 12.2 04/07/2020   HCT 39.2 04/07/2020   PLT 350 04/07/2020   GLUCOSE 179 (H) 04/07/2020   CHOL 194 02/02/2020   TRIG 196 (H) 02/02/2020   HDL 76 02/02/2020   LDLCALC 89 02/02/2020   ALT 21 02/02/2020   AST 18 02/02/2020   NA 139 04/07/2020   K 3.0 (L) 04/07/2020   CL 98 04/07/2020   CREATININE 0.89 04/07/2020   BUN 16 04/07/2020   CO2 27 04/07/2020   TSH 6.50 (H) 02/02/2020   INR 1.9 (H) 04/12/2020   HGBA1C 8.8 (H) 02/02/2020    Assessment/Plan:  Acute pain of  left shoulder -likely 2/2 arthritis in [facility]  joint -limited in options 2/2 coumadin use -pt to use topical Voltaren gel prn and Tylenol -continue regular meds-gabapentin and cymbalta -further recommendations based on imaging.  Consider Ortho referral and PT.  Arthritis of left acromioclavicular joint  - Plan: DG Shoulder Left  F/u prn  [name], MD

## 2020-04-16 NOTE — Patient Instructions (Addendum)
You can continue using heat on your shoulder.  You can also use Voltaren gel on it as needed.   Arthritis Arthritis means joint pain. It can also mean joint disease. A joint is a place where bones come together. There are more than 100 types of arthritis. What are the causes? This condition may be caused by:  Wear and tear of a joint. This is the most common cause.  A lot of acid in the blood, which leads to pain in the joint (gout).  Pain and swelling (inflammation) in a joint.  Infection of a joint.  Injuries in the joint.  A reaction to medicines (allergy). In some cases, the cause may not be known. What are the signs or symptoms? Symptoms of this condition include:  Redness at a joint.  Swelling at a joint.  Stiffness at a joint.  Warmth coming from the joint.  A fever.  A feeling of being sick. How is this treated? This condition may be treated with:  Treating the cause, if it is known.  Rest.  Raising (elevating) the joint.  Putting cold or hot packs on the joint.  Medicines to treat symptoms and reduce pain and swelling.  Shots of medicines (cortisone) into the joint. You may also be told to make changes in your life, such as doing exercises and losing weight. Follow these instructions at home: Medicines  Take over-the-counter and prescription medicines only as told by your doctor.  Do not take aspirin for pain if your doctor says that you may have gout. Activity  Rest your joint if your doctor tells you to.  Avoid activities that make the pain worse.  Exercise your joint regularly as told by your doctor. Try doing exercises like: ? Swimming. ? Water aerobics. ? Biking. ? Walking. Managing pain, stiffness, and swelling  If told, put ice on the affected area. ? Put ice in a plastic bag. ? Place a towel between your skin and the bag. ? Leave the ice on for 20 minutes, 2-3 times per day.  If your joint is swollen, raise (elevate) it above the  level of your heart if told by your doctor.  If your joint feels stiff in the morning, try taking a warm shower.  If told, put heat on the affected area. Do this as often as told by your doctor. Use the heat source that your doctor recommends, such as a moist heat pack or a heating pad. If you have diabetes, do not apply heat without asking your doctor. To apply heat: ? Place a towel between your skin and the heat source. ? Leave the heat on for 20-30 minutes. ? Remove the heat if your skin turns bright red. This is very important if you are unable to feel pain, heat, or cold. You may have a greater risk of getting burned.      General instructions  Do not use any products that contain nicotine or tobacco, such as cigarettes, e-cigarettes, and chewing tobacco. If you need help quitting, ask your doctor.  Keep all follow-up visits as told by your doctor. This is important. Contact a doctor if:  The pain gets worse.  You have a fever. Get help right away if:  You have very bad pain in your joint.  You have swelling in your joint.  Your joint is red.  Many joints become painful and swollen.  You have very bad back pain.  Your leg is very weak.  You cannot control your  pee (urine) or poop (stool). Summary  Arthritis means joint pain. It can also mean joint disease. A joint is a place where bones come together.  The most common cause of this condition is wear and tear of a joint.  Symptoms of this condition include redness, swelling, or stiffness of the joint.  This condition is treated with rest, raising the joint, medicines, and putting cold or hot packs on the joint.  Follow your doctor's instructions about medicines, activity, exercises, and other home care treatments. This information is not intended to replace advice given to you by your health care provider. Make sure you discuss any questions you have with your health care provider. Document Revised: 02/11/2018  Document Reviewed: 02/11/2018 Elsevier Patient Education  2021 [place].  Shoulder Pain Many things can cause shoulder pain, including:  An injury.  Moving the shoulder in the same way again and again (overuse).  Joint pain (arthritis). Pain can come from:  Swelling and irritation (inflammation) of any part of the shoulder.  An injury to the shoulder joint.  An injury to: ? Tissues that connect muscle to bone (tendons). ? Tissues that connect bones to each other (ligaments). ? Bones. Follow these instructions at home: Watch for changes in your symptoms. Let your doctor know about them. Follow these instructions to help with your pain. If you have a sling:  Wear the sling as told by your doctor. Remove it only as told by your doctor.  Loosen the sling if your fingers: ? Tingle. ? Become numb. ? Turn cold and blue.  Keep the sling clean.  If the sling is not waterproof: ? Do not let it get wet. ? Take the sling off when you shower or bathe. Managing pain, stiffness, and swelling  If told, put ice on the painful area: ? Put ice in a plastic bag. ? Place a towel between your skin and the bag. ? Leave the ice on for 20 minutes, 2-3 times a day. Stop putting ice on if it does not help with the pain.  Squeeze a soft ball or a foam pad as much as possible. This prevents swelling in the shoulder. It also helps to strengthen the arm.   General instructions  Take over-the-counter and prescription medicines only as told by your doctor.  Keep all follow-up visits as told by your doctor. This is important. Contact a doctor if:  Your pain gets worse.  Medicine does not help your pain.  You have new pain in your arm, hand, or fingers. Get help right away if:  Your arm, hand, or fingers: ? Tingle. ? Are numb. ? Are swollen. ? Are painful. ? Turn white or blue. Summary  Shoulder pain can be caused by many things. These include injury, moving the shoulder in the  same away again and again, and joint pain.  Watch for changes in your symptoms. Let your doctor know about them.  This condition may be treated with a sling, ice, and pain medicine.  Contact your doctor if the pain gets worse or you have new pain. Get help right away if your arm, hand, or fingers tingle or get numb, swollen, or painful.  Keep all follow-up visits as told by your doctor. This is important. This information is not intended to replace advice given to you by your health care provider. Make sure you discuss any questions you have with your health care provider. Document Revised: 09/18/2017 Document Reviewed: 09/18/2017 Elsevier Patient Education  2021 Elsevier  Inc.  

## 2020-04-21 ENCOUNTER — Telehealth (HOSPITAL_COMMUNITY): Payer: Self-pay | Admitting: Emergency Medicine

## 2020-04-21 NOTE — Telephone Encounter (Signed)
Attempted to call patient regarding upcoming cardiac CT appointment. °Left message on voicemail with name and callback number °Sherrica Niehaus RN Navigator Cardiac Imaging °[facility] °[phone] Office °[phone] Cell ° °

## 2020-04-22 DIAGNOSIS — J449 Chronic obstructive pulmonary disease, unspecified: Secondary | ICD-10-CM | POA: Diagnosis not present

## 2020-04-23 ENCOUNTER — Ambulatory Visit (HOSPITAL_COMMUNITY): Payer: Medicare Other

## 2020-05-03 ENCOUNTER — Telehealth: Payer: Self-pay | Admitting: Family Medicine

## 2020-05-03 NOTE — Progress Notes (Signed)
  Chronic Care Management   Outreach Note  05/03/2020 Name: Anastazia Creek Tom MRN: [id number] DOB: 1955-08-26  Referred by: Billie Ruddy, MD Reason for referral : No chief complaint on file.   An unsuccessful telephone outreach was attempted today. The patient was referred to the pharmacist for assistance with care management and care coordination.   Follow Up Plan:   Carley Perdue [employer] Scheduler

## 2020-05-04 ENCOUNTER — Other Ambulatory Visit: Payer: Self-pay

## 2020-05-04 ENCOUNTER — Ambulatory Visit (HOSPITAL_COMMUNITY): Payer: Medicare Other | Attending: Cardiovascular Disease

## 2020-05-04 DIAGNOSIS — R0600 Dyspnea, unspecified: Secondary | ICD-10-CM | POA: Diagnosis not present

## 2020-05-04 DIAGNOSIS — R0609 Other forms of dyspnea: Secondary | ICD-10-CM

## 2020-05-04 LAB — ECHOCARDIOGRAM COMPLETE
Area-P 1/2: 3.27 cm2
S' Lateral: 2.3 cm

## 2020-05-05 ENCOUNTER — Telehealth (HOSPITAL_COMMUNITY): Payer: Self-pay | Admitting: Emergency Medicine

## 2020-05-05 NOTE — Telephone Encounter (Signed)
Reaching out to patient to offer assistance regarding upcoming cardiac imaging study; pt verbalizes understanding of appt date/time, parking situation and where to check in, pre-test NPO status and medications ordered, and verified current allergies; name and call back number provided for further questions should they arise [name] RN Navigator Cardiac Imaging [facility] [phone] office [phone] cell   Pt given 1 time dose 100mg  metoprolol tartrate to take 2 hr prior to scan. Holding daily metoprolol succinate and lasix and other antihistamines Clarise Cruz

## 2020-05-06 DIAGNOSIS — Z9989 Dependence on other enabling machines and devices: Secondary | ICD-10-CM | POA: Diagnosis not present

## 2020-05-06 DIAGNOSIS — Z9114 Patient's other noncompliance with medication regimen: Secondary | ICD-10-CM | POA: Diagnosis not present

## 2020-05-06 DIAGNOSIS — G4733 Obstructive sleep apnea (adult) (pediatric): Secondary | ICD-10-CM | POA: Diagnosis not present

## 2020-05-07 ENCOUNTER — Telehealth: Payer: Self-pay

## 2020-05-07 ENCOUNTER — Encounter: Payer: Self-pay | Admitting: *Deleted

## 2020-05-07 ENCOUNTER — Other Ambulatory Visit: Payer: Self-pay

## 2020-05-07 ENCOUNTER — Ambulatory Visit (HOSPITAL_COMMUNITY)
Admission: RE | Admit: 2020-05-07 | Discharge: 2020-05-07 | Disposition: A | Discharge status: Home / Self Care | Payer: Medicare Other | Source: Ambulatory Visit | Attending: Internal Medicine | Admitting: Internal Medicine

## 2020-05-07 ENCOUNTER — Encounter: Payer: Medicare Other | Admitting: *Deleted

## 2020-05-07 DIAGNOSIS — K449 Diaphragmatic hernia without obstruction or gangrene: Secondary | ICD-10-CM | POA: Insufficient documentation

## 2020-05-07 DIAGNOSIS — Z006 Encounter for examination for normal comparison and control in clinical research program: Secondary | ICD-10-CM

## 2020-05-07 DIAGNOSIS — I700 Atherosclerosis of aorta: Secondary | ICD-10-CM | POA: Diagnosis not present

## 2020-05-07 DIAGNOSIS — K760 Fatty (change of) liver, not elsewhere classified: Secondary | ICD-10-CM | POA: Diagnosis not present

## 2020-05-07 DIAGNOSIS — R0609 Other forms of dyspnea: Secondary | ICD-10-CM

## 2020-05-07 DIAGNOSIS — R0600 Dyspnea, unspecified: Secondary | ICD-10-CM | POA: Diagnosis not present

## 2020-05-07 MED ORDER — DILTIAZEM HCL 25 MG/5ML IV SOLN
INTRAVENOUS | Status: AC
  Administered 2020-05-07: 5 mg via INTRAVENOUS
  Filled 2020-05-07: qty 5

## 2020-05-07 MED ORDER — DILTIAZEM HCL 25 MG/5ML IV SOLN
5.0000 mg | INTRAVENOUS | Status: DC | PRN
  Filled 2020-05-07 (×2): qty 5

## 2020-05-07 MED ORDER — NITROGLYCERIN 0.4 MG SL SUBL
0.8000 mg | SUBLINGUAL_TABLET | Freq: Once | SUBLINGUAL | Status: AC | PRN
  Administered 2020-05-07: 0.8 mg via SUBLINGUAL

## 2020-05-07 MED ORDER — IOHEXOL 350 MG/ML SOLN
80.0000 mL | Freq: Once | INTRAVENOUS | Status: AC | PRN
  Administered 2020-05-07: 80 mL via INTRAVENOUS

## 2020-05-07 MED ORDER — NITROGLYCERIN 0.4 MG SL SUBL
SUBLINGUAL_TABLET | SUBLINGUAL | Status: AC
  Filled 2020-05-07: qty 2

## 2020-05-07 NOTE — Telephone Encounter (Signed)
The patient has been notified of the result and verbalized understanding.  All questions (if any) were answered. Wilma Flavin, RN 05/07/2020 11:13 AM

## 2020-05-07 NOTE — Research (Signed)
IDENTIFY Informed Consent                  Subject Name:   [name]. Gul   Subject met inclusion and exclusion criteria.  The informed consent form, study requirements and expectations were reviewed with the subject and questions and concerns were addressed prior to the signing of the consent form.  The subject verbalized understanding of the trial requirements.  The subject agreed to participate in the IDENTIFY trial and signed the informed consent.  The informed consent was obtained prior to performance of any protocol-specific procedures for the subject.  A copy of the signed informed consent was given to the subject and a copy was placed in the subject's medical record.   [place] Shaquana Buel, Research Assistant  05/07/2020  08:00 a.m.

## 2020-05-07 NOTE — Telephone Encounter (Signed)
-----   Message from Werner Lean, MD sent at 05/06/2020 10:38 AM EST ----- Results: Normal Echo Plan: Continue current plan  Werner Lean, MD

## 2020-05-07 NOTE — Research (Signed)
Research visit.

## 2020-05-07 NOTE — Progress Notes (Signed)
Patient arrived to Radiology for CT coronary.This RN  attempted IV access on right arm, unsuccessful attempt, IV would not thread completely and was position. Another RN Jaci Standard. Attempted on left arm with ultrasound guidance, unsuccessful.  Order placed for IV Team Consult for assistance with difficult IV placement.

## 2020-05-07 NOTE — Progress Notes (Signed)
   05/07/20 0952  Vital Signs  Pulse Rate 70  Pulse Rate Source Monitor  BP (!) 141/73  MAP (mmHg) 92  BP Location Left Arm  BP Method Automatic  Patient Position (if appropriate) Sitting  MEWS Score  MEWS Temp 0  MEWS Systolic 0  MEWS Pulse 0  MEWS RR 0  MEWS LOC 0  MEWS Score 0  MEWS Score Color Green    Recheck of vitals after arrival back from CT coronary. No complaints of lightheadedness and dizziness.

## 2020-05-10 ENCOUNTER — Inpatient Hospital Stay: Payer: Medicare Other | Attending: Oncology | Admitting: Oncology

## 2020-05-10 ENCOUNTER — Encounter: Payer: Self-pay | Admitting: *Deleted

## 2020-05-10 ENCOUNTER — Inpatient Hospital Stay: Payer: Medicare Other

## 2020-05-10 DIAGNOSIS — Z86718 Personal history of other venous thrombosis and embolism: Secondary | ICD-10-CM | POA: Insufficient documentation

## 2020-05-10 DIAGNOSIS — Z7901 Long term (current) use of anticoagulants: Secondary | ICD-10-CM | POA: Insufficient documentation

## 2020-05-10 NOTE — Progress Notes (Signed)
Patient was "no show" for lab/OV today. Scheduling message sent to get her in for lab in next few days w/lab and OV  In 1 month.

## 2020-05-11 ENCOUNTER — Other Ambulatory Visit: Payer: Self-pay | Admitting: Internal Medicine

## 2020-05-14 ENCOUNTER — Other Ambulatory Visit: Payer: Self-pay

## 2020-05-14 ENCOUNTER — Inpatient Hospital Stay (HOSPITAL_BASED_OUTPATIENT_CLINIC_OR_DEPARTMENT_OTHER): Payer: Medicare Other | Admitting: Oncology

## 2020-05-14 ENCOUNTER — Inpatient Hospital Stay: Payer: Medicare Other

## 2020-05-14 VITALS — BP 141/86 | HR 94 | Temp 97.6°F | Resp 18 | Ht 66.0 in | Wt 286.1 lb

## 2020-05-14 DIAGNOSIS — Z7901 Long term (current) use of anticoagulants: Secondary | ICD-10-CM

## 2020-05-14 DIAGNOSIS — I82403 Acute embolism and thrombosis of unspecified deep veins of lower extremity, bilateral: Secondary | ICD-10-CM

## 2020-05-14 DIAGNOSIS — Z86718 Personal history of other venous thrombosis and embolism: Secondary | ICD-10-CM | POA: Diagnosis not present

## 2020-05-14 LAB — CBC WITH DIFFERENTIAL (CANCER CENTER ONLY)
Abs Immature Granulocytes: 0.02 10*3/uL (ref 0.00–0.07)
Basophils Absolute: 0 10*3/uL (ref 0.0–0.1)
Basophils Relative: 0 %
Eosinophils Absolute: 0.2 10*3/uL (ref 0.0–0.5)
Eosinophils Relative: 3 %
HCT: 39.3 % (ref 36.0–46.0)
Hemoglobin: 12.5 g/dL (ref 12.0–15.0)
Immature Granulocytes: 0 %
Lymphocytes Relative: 31 %
Lymphs Abs: 2.2 10*3/uL (ref 0.7–4.0)
MCH: 26.2 pg (ref 26.0–34.0)
MCHC: 31.8 g/dL (ref 30.0–36.0)
MCV: 82.4 fL (ref 80.0–100.0)
Monocytes Absolute: 0.6 10*3/uL (ref 0.1–1.0)
Monocytes Relative: 8 %
Neutro Abs: 4 10*3/uL (ref 1.7–7.7)
Neutrophils Relative %: 58 %
Platelet Count: 321 10*3/uL (ref 150–400)
RBC: 4.77 MIL/uL (ref 3.87–5.11)
RDW: 17.8 % — ABNORMAL HIGH (ref 11.5–15.5)
WBC Count: 7 10*3/uL (ref 4.0–10.5)
nRBC: 0 % (ref 0.0–0.2)

## 2020-05-14 LAB — PROTIME-INR
INR: 1.9 — ABNORMAL HIGH (ref 0.8–1.2)
Prothrombin Time: 21 seconds — ABNORMAL HIGH (ref 11.4–15.2)

## 2020-05-14 NOTE — Progress Notes (Signed)
  [name] OFFICE PROGRESS NOTE   Diagnosis: Chronic anticoagulation therapy  INTERVAL HISTORY:   [name] returns as scheduled.  She continues Coumadin anticoagulation.  No bleeding or symptom of thrombosis.  She feels well.  Objective:  Vital signs in last 24 hours:  Blood pressure (!) 141/86, pulse 94, temperature 97.6 F (36.4 C), temperature source Tympanic, resp. rate 18, height 5\' 6"  (1.676 m), weight 286 lb 1.6 oz (129.8 kg), SpO2 98 %.  Resp: Lungs clear bilaterally Cardio: Regular rate and rhythm GI: No hepatosplenomegaly Vascular: No leg edema, left lower leg is larger than the right side, no erythema or tenderness    Lab Results:  Lab Results  Component Value Date   WBC 7.0 05/14/2020   HGB 12.5 05/14/2020   HCT 39.3 05/14/2020   MCV 82.4 05/14/2020   PLT 321 05/14/2020   NEUTROABS 4.0 05/14/2020    CMP  Lab Results  Component Value Date   NA 139 04/07/2020   K 3.0 (L) 04/07/2020   CL 98 04/07/2020   CO2 27 04/07/2020   GLUCOSE 179 (H) 04/07/2020   BUN 16 04/07/2020   CREATININE 0.89 04/07/2020   CALCIUM 8.2 (L) 04/07/2020   PROT 8.5 (H) 02/02/2020   ALBUMIN 2.8 (L) 03/15/2019   AST 18 02/02/2020   ALT 21 02/02/2020   ALKPHOS 66 03/15/2019   BILITOT 0.2 02/02/2020   GFRNONAA >60 04/07/2020   GFRAA 94 03/05/2020   PT/INR 1.9 Medications: I have reviewed the patient's current medications.   Assessment/Plan: 1. History of bilateral lower extremity deep vein thrombosis. She continues Coumadin anticoagulation. 2. History of a positive lupus anticoagulant. 3. History of a positive beta-2-glycoprotein IgA antibody. 4. Chronic bilateral leg pain - negative bilateral Doppler January 2010. 5. History of hypertension. 6. COPD 7. Status post hysterectomy. 8. Remote history of cocaine abuse. 9. History of an adrenal lesion on a CT scan in 2008 - status post a repeat CT on June 16, 2009, showing slow enlargement of the right adrenal  nodule since 2006, most consistent with an adenoma. Stable on a CT 04/24/2014 10. History of rectal and vaginal bleeding in May 2009.  11. Status post carpal tunnel surgery. 12. 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 Right knee replacement September 2011. 13. Multiple orthopedic conditions. 14. Placement of IVC filter preoperatively before knee replacement. The IVC filter was retrieved 03/01/2011. 15. Anemia. Ferritin in normal range at 82 on 08/18/2013; 61 on 07/10/2016 16. Status post lumbar fusion surgery October 2013 17. Right hydronephrosis-evaluated by Dr. Tresa Moore 18. Left ankle swelling/pain 07/24/2016 19. Mild anemia noted on hospital admission 2018 20. Colonoscopy 04/19/2017-entire examined colon normal. Repeat colonoscopy in 10 years for surveillance. 21. Admission with COVID-19 infection 03/09/2019    Disposition: [name] appears stable.  She will continue Coumadin anticoagulation at the current dose.  She will return for a PT/INR in 1 month. [name] declines a COVID-19 booster vaccine.  She will be scheduled for a 8-month office visit.  [name], MD  05/14/2020  9:10 AM

## 2020-05-14 NOTE — Progress Notes (Deleted)
  [facility] OFFICE PROGRESS NOTE   Diagnosis:   INTERVAL HISTORY:   ***  Objective:  Vital signs in last 24 hours:  Blood pressure (!) 141/86, pulse 94, temperature 97.6 F (36.4 C), temperature source Tympanic, resp. rate 18, height 5\' 6"  (1.676 m), weight 286 lb 1.6 oz (129.8 kg), SpO2 98 %.    HEENT: *** Lymphatics: *** Resp: *** Cardio: *** GI: *** Vascular: *** Neuro:***  Skin:***   Portacath/PICC-without erythema  Lab Results:  Lab Results  Component Value Date   WBC 7.0 05/14/2020   HGB 12.5 05/14/2020   HCT 39.3 05/14/2020   MCV 82.4 05/14/2020   PLT 321 05/14/2020   NEUTROABS 4.0 05/14/2020    CMP  Lab Results  Component Value Date   NA 139 04/07/2020   K 3.0 (L) 04/07/2020   CL 98 04/07/2020   CO2 27 04/07/2020   GLUCOSE 179 (H) 04/07/2020   BUN 16 04/07/2020   CREATININE 0.89 04/07/2020   CALCIUM 8.2 (L) 04/07/2020   PROT 8.5 (H) 02/02/2020   ALBUMIN 2.8 (L) 03/15/2019   AST 18 02/02/2020   ALT 21 02/02/2020   ALKPHOS 66 03/15/2019   BILITOT 0.2 02/02/2020   GFRNONAA >60 04/07/2020   GFRAA 94 03/05/2020    No results found for: CEA1  Lab Results  Component Value Date   INR 1.9 (H) 04/12/2020    Imaging:  No results found.  Medications: I have reviewed the patient's current medications.   Assessment/Plan: 1. History of bilateral lower extremity deep vein thrombosis. She continues Coumadin anticoagulation. 2. History of a positive lupus anticoagulant. 3. History of a positive beta-2-glycoprotein IgA antibody. 4. Chronic bilateral leg pain - negative bilateral Doppler January 2010. 5. History of hypertension. 6. COPD 7. Status post hysterectomy. 8. Remote history of cocaine abuse. 9. History of an adrenal lesion on a CT scan in 2008 - status post a repeat CT on June 16, 2009, showing slow enlargement of the right adrenal nodule since 2006, most consistent with an adenoma. Stable on a CT  04/24/2014 10. History of rectal and vaginal bleeding in May 2009.  11. Status post carpal tunnel surgery. 12. 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 Right knee replacement September 2011. 13. Multiple orthopedic conditions. 14. Placement of IVC filter preoperatively before knee replacement. The IVC filter was retrieved 03/01/2011. 15. Anemia. Ferritin in normal range at 82 on 08/18/2013; 61 on 07/10/2016 16. Status post lumbar fusion surgery October 2013 17. Right hydronephrosis-evaluated by Dr. Tresa Moore 18. Left ankle swelling/pain 07/24/2016 19. Mild anemia noted on hospital admission 2018 20. Colonoscopy 04/19/2017-entire examined colon normal. Repeat colonoscopy in 10 years for surveillance. 21. Admission with COVID-19 infection 03/09/2019     Disposition: ***  [name], MD  05/14/2020  9:13 AM

## 2020-05-17 DIAGNOSIS — J449 Chronic obstructive pulmonary disease, unspecified: Secondary | ICD-10-CM | POA: Diagnosis not present

## 2020-05-18 ENCOUNTER — Telehealth: Payer: Self-pay | Admitting: Family Medicine

## 2020-05-18 NOTE — Progress Notes (Signed)
  Chronic Care Management   Outreach Note  05/18/2020 Name: Joylene Wescott Gradel MRN: [id number] DOB: 12/03/1955  Referred by: Billie Ruddy, MD Reason for referral : No chief complaint on file.   A second unsuccessful telephone outreach was attempted today. The patient was referred to pharmacist for assistance with care management and care coordination.  Follow Up Plan:   Carley Perdue [employer] Scheduler

## 2020-05-31 ENCOUNTER — Telehealth: Payer: Self-pay | Admitting: Family Medicine

## 2020-05-31 NOTE — Telephone Encounter (Signed)
Patient is requesting a refill on her gabapentin  Pharmacy: [facility].  Please advise.

## 2020-06-04 ENCOUNTER — Other Ambulatory Visit: Payer: Self-pay

## 2020-06-05 ENCOUNTER — Other Ambulatory Visit: Payer: Self-pay | Admitting: Family Medicine

## 2020-06-05 DIAGNOSIS — I10 Essential (primary) hypertension: Secondary | ICD-10-CM

## 2020-06-05 DIAGNOSIS — I5032 Chronic diastolic (congestive) heart failure: Secondary | ICD-10-CM

## 2020-06-07 ENCOUNTER — Ambulatory Visit (INDEPENDENT_AMBULATORY_CARE_PROVIDER_SITE_OTHER): Payer: Medicare Other | Admitting: Family Medicine

## 2020-06-07 ENCOUNTER — Encounter: Payer: Self-pay | Admitting: Family Medicine

## 2020-06-07 ENCOUNTER — Other Ambulatory Visit: Payer: Self-pay

## 2020-06-07 VITALS — BP 140/84 | HR 90 | Temp 98.1°F | Wt 290.8 lb

## 2020-06-07 DIAGNOSIS — E1169 Type 2 diabetes mellitus with other specified complication: Secondary | ICD-10-CM | POA: Diagnosis not present

## 2020-06-07 DIAGNOSIS — J029 Acute pharyngitis, unspecified: Secondary | ICD-10-CM

## 2020-06-07 DIAGNOSIS — E669 Obesity, unspecified: Secondary | ICD-10-CM

## 2020-06-07 DIAGNOSIS — M797 Fibromyalgia: Secondary | ICD-10-CM

## 2020-06-07 DIAGNOSIS — E1159 Type 2 diabetes mellitus with other circulatory complications: Secondary | ICD-10-CM

## 2020-06-07 DIAGNOSIS — I152 Hypertension secondary to endocrine disorders: Secondary | ICD-10-CM

## 2020-06-07 DIAGNOSIS — E041 Nontoxic single thyroid nodule: Secondary | ICD-10-CM | POA: Diagnosis not present

## 2020-06-07 LAB — TSH: TSH: 4.23 u[IU]/mL (ref 0.35–4.50)

## 2020-06-07 LAB — BASIC METABOLIC PANEL
BUN: 16 mg/dL (ref 6–23)
CO2: 24 mEq/L (ref 19–32)
Calcium: 8.8 mg/dL (ref 8.4–10.5)
Chloride: 104 mEq/L (ref 96–112)
Creatinine, Ser: 0.71 mg/dL (ref 0.40–1.20)
GFR: 89.49 mL/min (ref >60.00)
Glucose, Bld: 192 mg/dL — ABNORMAL HIGH (ref 70–99)
Potassium: 3.4 mEq/L — ABNORMAL LOW (ref 3.5–5.1)
Sodium: 138 mEq/L (ref 135–145)

## 2020-06-07 LAB — CBC WITH DIFFERENTIAL/PLATELET
Basophils Absolute: 0 10*3/uL (ref 0.0–0.1)
Basophils Relative: 0.3 % (ref 0.0–3.0)
Eosinophils Absolute: 0.1 10*3/uL (ref 0.0–0.7)
Eosinophils Relative: 1.9 % (ref 0.0–5.0)
HCT: 36.4 % (ref 36.0–46.0)
Hemoglobin: 11.9 g/dL — ABNORMAL LOW (ref 12.0–15.0)
Lymphocytes Relative: 22.9 % (ref 12.0–46.0)
Lymphs Abs: 1.7 10*3/uL (ref 0.7–4.0)
MCHC: 32.8 g/dL (ref 30.0–36.0)
MCV: 82.1 fl (ref 78.0–100.0)
Monocytes Absolute: 0.5 10*3/uL (ref 0.1–1.0)
Monocytes Relative: 7 % (ref 3.0–12.0)
Neutro Abs: 4.9 10*3/uL (ref 1.4–7.7)
Neutrophils Relative %: 67.9 % (ref 43.0–77.0)
Platelets: 285 10*3/uL (ref 150.0–400.0)
RBC: 4.43 Mil/uL (ref 3.87–5.11)
RDW: 17.8 % — ABNORMAL HIGH (ref 11.5–15.5)
WBC: 7.3 10*3/uL (ref 4.0–10.5)

## 2020-06-07 LAB — VITAMIN B12: Vitamin B-12: 244 pg/mL (ref 211–911)

## 2020-06-07 LAB — HEMOGLOBIN A1C: Hgb A1c MFr Bld: 8.7 % — ABNORMAL HIGH (ref 4.6–6.5)

## 2020-06-07 LAB — VITAMIN D 25 HYDROXY (VIT D DEFICIENCY, FRACTURES): VITD: 16.33 ng/mL — ABNORMAL LOW (ref 30.00–100.00)

## 2020-06-07 LAB — T4, FREE: Free T4: 0.59 ng/dL — ABNORMAL LOW (ref 0.60–1.60)

## 2020-06-07 LAB — FOLATE: Folate: 10.8 ng/mL (ref >5.9)

## 2020-06-07 MED ORDER — GABAPENTIN 300 MG PO CAPS: 300.0000 mg | ORAL_CAPSULE | Freq: Every day | ORAL | 3 refills | Status: DC

## 2020-06-07 NOTE — Patient Instructions (Signed)
Myofascial Pain Syndrome and Fibromyalgia Myofascial pain syndrome and fibromyalgia are both pain disorders. This pain may be felt mainly in your muscles.  Myofascial pain syndrome: ? Always has tender points in the muscle that will cause pain when pressed (trigger points). The pain may come and go. ? Usually affects your neck, upper back, and shoulder areas. The pain often radiates into your arms and hands.  Fibromyalgia: ? Has muscle pains and tenderness that come and go. ? Is often associated with fatigue and sleep problems. ? Has trigger points. ? Tends to be long-lasting (chronic), but is not life-threatening. Fibromyalgia and myofascial pain syndrome are not the same. However, they often occur together. If you have both conditions, each can make the other worse. Both are common and can cause enough pain and fatigue to make day-to-day activities difficult. Both can be hard to diagnose because their symptoms are common in many other conditions. What are the causes? The exact causes of these conditions are not known. What increases the risk? You are more likely to develop this condition if:  You have a family history of the condition.  You have certain triggers, such as: ? Spine disorders. ? An injury (trauma) or other physical stressors. ? Being under a lot of stress. ? Medical conditions such as osteoarthritis, rheumatoid arthritis, or lupus. What are the signs or symptoms? Fibromyalgia The main symptom of fibromyalgia is widespread pain and tenderness in your muscles. Pain is sometimes described as stabbing, shooting, or burning. You may also have:  Tingling or numbness.  Sleep problems and fatigue.  Problems with attention and concentration (fibro fog). Other symptoms may include:  Bowel and bladder problems.  Headaches.  Visual problems.  Problems with odors and noises.  Depression or mood changes.  Painful menstrual periods (dysmenorrhea).  Dry skin or  eyes. These symptoms can vary over time. Myofascial pain syndrome Symptoms of myofascial pain syndrome include:  Tight, ropy bands of muscle.  Uncomfortable sensations in muscle areas. These may include aching, cramping, burning, numbness, tingling, and weakness.  Difficulty moving certain parts of the body freely (poor range of motion). How is this diagnosed? This condition may be diagnosed by your symptoms and medical history. You will also have a physical exam. In general:  Fibromyalgia is diagnosed if you have pain, fatigue, and other symptoms for more than 3 months, and symptoms cannot be explained by another condition.  Myofascial pain syndrome is diagnosed if you have trigger points in your muscles, and those trigger points are tender and cause pain elsewhere in your body (referred pain). How is this treated? Treatment for these conditions depends on the type that you have.  For fibromyalgia: ? Pain medicines, such as NSAIDs. ? Medicines for treating depression. ? Medicines for treating seizures. ? Medicines that relax the muscles.  For myofascial pain: ? Pain medicines, such as NSAIDs. ? Cooling and stretching of muscles. ? Trigger point injections. ? Sound wave (ultrasound) treatments to stimulate muscles. Treating these conditions often requires a team of health care providers. These may include:  Your primary care provider.  Physical therapist.  Complementary health care providers, such as massage therapists or acupuncturists.  Psychiatrist for cognitive behavioral therapy.   Follow these instructions at home: Medicines  Take over-the-counter and prescription medicines only as told by your health care provider.  Do not drive or use heavy machinery while taking prescription pain medicine.  If you are taking prescription pain medicine, take actions to prevent or treat constipation. Your  health care provider may recommend that you: ? Drink enough fluid to keep  your urine pale yellow. ? Eat foods that are high in fiber, such as fresh fruits and vegetables, whole grains, and beans. ? Limit foods that are high in fat and processed sugars, such as fried or sweet foods. ? Take an over-the-counter or prescription medicine for constipation. Lifestyle  Exercise as directed by your health care provider or physical therapist.  Practice relaxation techniques to control your stress. You may want to try: ? Biofeedback. ? Visual imagery. ? Hypnosis. ? Muscle relaxation. ? Yoga. ? Meditation.  Maintain a healthy lifestyle. This includes eating a healthy diet and getting enough sleep.  Do not use any products that contain nicotine or tobacco, such as cigarettes and e-cigarettes. If you need help quitting, ask your health care provider.   General instructions  Talk to your health care provider about complementary treatments, such as acupuncture or massage.  Consider joining a support group with others who are diagnosed with this condition.  Do not do activities that stress or strain your muscles. This includes repetitive motions and heavy lifting.  Keep all follow-up visits as told by your health care provider. This is important. Where to find more information  National Fibromyalgia Association: www.fmaware.[place]: www.arthritis.org  American Chronic Pain Association: www.theacpa.org Contact a health care provider if:  You have new symptoms.  Your symptoms get worse or your pain is severe.  You have side effects from your medicines.  You have trouble sleeping.  Your condition is causing depression or anxiety. Summary  Myofascial pain syndrome and fibromyalgia are pain disorders.  Myofascial pain syndrome has tender points in the muscle that will cause pain when pressed (trigger points). Fibromyalgia also has muscle pains and tenderness that come and go, but this condition is often associated with fatigue and sleep  disturbances.  Fibromyalgia and myofascial pain syndrome are not the same but often occur together, causing pain and fatigue that make day-to-day activities difficult.  Treatment for fibromyalgia includes taking medicines to relax the muscles and medicines for pain, depression, or seizures. Treatment for myofascial pain syndrome includes taking medicines for pain, cooling and stretching of muscles, and injecting medicines into trigger points.  Follow your health care provider's instructions for taking medicines and maintaining a healthy lifestyle. This information is not intended to replace advice given to you by your health care provider. Make sure you discuss any questions you have with your health care provider. Document Revised: 06/28/2018 Document Reviewed: 03/21/2017 Elsevier Patient Education  2021 [place] Your Hypertension Hypertension, also called high blood pressure, is when the force of the blood pressing against the walls of the arteries is too strong. Arteries are blood vessels that carry blood from your heart throughout your body. Hypertension forces the heart to work harder to pump blood and may cause the arteries to become narrow or stiff. Understanding blood pressure readings Your personal target blood pressure may vary depending on your medical conditions, your age, and other factors. A blood pressure reading includes a higher number over a lower number. Ideally, your blood pressure should be below 120/80. You should know that:  The first, or top, number is called the systolic pressure. It is a measure of the pressure in your arteries as your heart beats.  The second, or bottom number, is called the diastolic pressure. It is a measure of the pressure in your arteries as the heart relaxes. Blood pressure is classified  into four stages. Based on your blood pressure reading, your health care provider may use the following stages to determine what type of treatment  you need, if any. Systolic pressure and diastolic pressure are measured in a unit called mmHg. Normal  Systolic pressure: below 710.  Diastolic pressure: below 80. Elevated  Systolic pressure: 626-948.  Diastolic pressure: below 80. Hypertension stage 1  Systolic pressure: 546-270.  Diastolic pressure: 35-00. Hypertension stage 2  Systolic pressure: 938 or above.  Diastolic pressure: 90 or above. How can this condition affect me? Managing your hypertension is an important responsibility. Over time, hypertension can damage the arteries and decrease blood flow to important parts of the body, including the brain, heart, and kidneys. Having untreated or uncontrolled hypertension can lead to:  A heart attack.  A stroke.  A weakened blood vessel (aneurysm).  Heart failure.  Kidney damage.  Eye damage.  Metabolic syndrome.  Memory and concentration problems.  Vascular dementia. What actions can I take to manage this condition? Hypertension can be managed by making lifestyle changes and possibly by taking medicines. Your health care provider will help you make a plan to bring your blood pressure within a normal range. Nutrition  Eat a diet that is high in fiber and potassium, and low in salt (sodium), added sugar, and fat. An example eating plan is called the Dietary Approaches to Stop Hypertension (DASH) diet. To eat this way: ? Eat plenty of fresh fruits and vegetables. Try to fill one-half of your plate at each meal with fruits and vegetables. ? Eat whole grains, such as whole-wheat pasta, brown rice, or whole-grain bread. Fill about one-fourth of your plate with whole grains. ? Eat low-fat dairy products. ? Avoid fatty cuts of meat, processed or cured meats, and poultry with skin. Fill about one-fourth of your plate with lean proteins such as fish, chicken without skin, beans, eggs, and tofu. ? Avoid pre-made and processed foods. These tend to be higher in sodium, added  sugar, and fat.  Reduce your daily sodium intake. Most people with hypertension should eat less than 1,500 mg of sodium a day.   Lifestyle  Work with your health care provider to maintain a healthy body weight or to lose weight. Ask what an ideal weight is for you.  Get at least 30 minutes of exercise that causes your heart to beat faster (aerobic exercise) most days of the week. Activities may include walking, swimming, or biking.  Include exercise to strengthen your muscles (resistance exercise), such as weight lifting, as part of your weekly exercise routine. Try to do these types of exercises for 30 minutes at least 3 days a week.  Do not use any products that contain nicotine or tobacco, such as cigarettes, e-cigarettes, and chewing tobacco. If you need help quitting, ask your health care provider.  Control any long-term (chronic) conditions you have, such as high cholesterol or diabetes.  Identify your sources of stress and find ways to manage stress. This may include meditation, deep breathing, or making time for fun activities.   Alcohol use  Do not drink alcohol if: ? Your health care provider tells you not to drink. ? You are pregnant, may be pregnant, or are planning to become pregnant.  If you drink alcohol: ? Limit how much you use to:  0-1 drink a day for women.  0-2 drinks a day for men. ? Be aware of how much alcohol is in your drink. In the [place] one drink equals  one 12 oz bottle of beer (355 mL), one 5 oz glass of wine (148 mL), or one 1 oz glass of hard liquor (44 mL). Medicines Your health care provider may prescribe medicine if lifestyle changes are not enough to get your blood pressure under control and if:  Your systolic blood pressure is 130 or higher.  Your diastolic blood pressure is 80 or higher. Take medicines only as told by your health care provider. Follow the directions carefully. Blood pressure medicines must be taken as told by your health care  provider. The medicine does not work as well when you skip doses. Skipping doses also puts you at risk for problems. Monitoring Before you monitor your blood pressure:  Do not smoke, drink caffeinated beverages, or exercise within 30 minutes before taking a measurement.  Use the bathroom and empty your bladder (urinate).  Sit quietly for at least 5 minutes before taking measurements. Monitor your blood pressure at home as told by your health care provider. To do this:  Sit with your back straight and supported.  Place your feet flat on the floor. Do not cross your legs.  Support your arm on a flat surface, such as a table. Make sure your upper arm is at heart level.  Each time you measure, take two or three readings one minute apart and record the results. You may also need to have your blood pressure checked regularly by your health care provider.   General information  Talk with your health care provider about your diet, exercise habits, and other lifestyle factors that may be contributing to hypertension.  Review all the medicines you take with your health care provider because there may be side effects or interactions.  Keep all visits as told by your health care provider. Your health care provider can help you create and adjust your plan for managing your high blood pressure. Where to find more information  National Heart, Lung, and Blood Institute: https://wilson-eaton.com/  American Heart Association: www.heart.org Contact a health care provider if:  You think you are having a reaction to medicines you have taken.  You have repeated (recurrent) headaches.  You feel dizzy.  You have swelling in your ankles.  You have trouble with your vision. Get help right away if:  You develop a severe headache or confusion.  You have unusual weakness or numbness, or you feel faint.  You have severe pain in your chest or abdomen.  You vomit repeatedly.  You have trouble  breathing. These symptoms may represent a serious problem that is an emergency. Do not wait to see if the symptoms will go away. Get medical help right away. Call your local emergency services (911 in the [place] Do not drive yourself to the hospital. Summary  Hypertension is when the force of blood pumping through your arteries is too strong. If this condition is not controlled, it may put you at risk for serious complications.  Your personal target blood pressure may vary depending on your medical conditions, your age, and other factors. For most people, a normal blood pressure is less than 120/80.  Hypertension is managed by lifestyle changes, medicines, or both.  Lifestyle changes to help manage hypertension include losing weight, eating a healthy, low-sodium diet, exercising more, stopping smoking, and limiting alcohol. This information is not intended to replace advice given to you by your health care provider. Make sure you discuss any questions you have with your health care provider. Document Revised: 04/11/2019 Document Reviewed: 02/04/2019 Elsevier  Patient Education  [place].  Sore Throat When you have a sore throat, your throat may feel:  Tender.  Burning.  Irritated.  Scratchy.  Painful when you swallow.  Painful when you talk. Many things can cause a sore throat, such as:  An infection.  Allergies.  Dry air.  Smoke or pollution.  Radiation treatment.  Gastroesophageal reflux disease (GERD).  A tumor. A sore throat can be the first sign of another sickness. It can happen with other problems, like:  Coughing.  Sneezing.  Fever.  Swelling in the neck. Most sore throats go away without treatment. Follow these instructions at home:  Take over-the-counter medicines only as told by your doctor. ? If your child has a sore throat, do not give your child aspirin.  Drink enough fluids to keep your pee (urine) pale yellow.  Rest when you feel  you need to.  To help with pain: ? Sip warm liquids, such as broth, herbal tea, or warm water. ? Eat or drink cold or frozen liquids, such as frozen ice pops. ? Gargle with a salt-water mixture 3-4 times a day or as needed. To make a salt-water mixture, add -1 tsp (3-6 g) of salt to 1 cup (237 mL) of warm water. Mix it until you cannot see the salt anymore. ? Suck on hard candy or throat lozenges. ? Put a cool-mist humidifier in your bedroom at night. ? Sit in the bathroom with the door closed for 5-10 minutes while you run hot water in the shower.  Do not use any products that contain nicotine or tobacco, such as cigarettes, e-cigarettes, and chewing tobacco. If you need help quitting, ask your doctor.  Wash your hands well and often with soap and water. If soap and water are not available, use hand sanitizer.      Contact a doctor if:  You have a fever for more than 2-3 days.  You keep having symptoms for more than 2-3 days.  Your throat does not get better in 7 days.  You have a fever and your symptoms suddenly get worse.  Your child who is 3 months to 77 years old has a temperature of 102.68F (39C) or higher. Get help right away if:  You have trouble breathing.  You cannot swallow fluids, soft foods, or your saliva.  You have swelling in your throat or neck that gets worse.  You keep feeling sick to your stomach (nauseous).  You keep throwing up (vomiting). Summary  A sore throat is pain, burning, irritation, or scratchiness in the throat. Many things can cause a sore throat.  Take over-the-counter medicines only as told by your doctor. Do not give your child aspirin.  Drink plenty of fluids, and rest as needed.  Contact a doctor if your symptoms get worse or your sore throat does not get better within 7 days. This information is not intended to replace advice given to you by your health care provider. Make sure you discuss any questions you have with your health  care provider. Document Revised: 08/06/2017 Document Reviewed: 08/06/2017 Elsevier Patient Education  2021 [employer].

## 2020-06-07 NOTE — Progress Notes (Signed)
Subjective:    Patient ID: [name], female    DOB: 06/22/1955, 65 y.o.   MRN: [id number]  Chief Complaint  Patient presents with  . Follow-up    HPI Patient is a 65 yo female with pmh sig for h/o DVT, chronic diastolic CHF, HTN, COPD, OSA, GERD, DM II, hypothyroidism, obesity, fibromyalgia, h/o substance abuse, R TKR, obesity, arthritis was seen today for acute concern.  Patient endorses fibromyalgia flare and recent sore throat.  Patient endorses throat being sore after having alcohol over the weekend for her birthday.  Feels like a "knot in her throat" endorses some dysphagia with solid foods but not liquids..  Patient endorses fibromyalgia flare after being out of gabapentin x2 weeks.  Past Medical History:  Diagnosis Date  . Adrenal mass ([place]) 03/226   Benign  . Arthritis    knees/multiple orthopedic conditons; lower back  . Asthma    per pt  . 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 Depression   . Diabetes mellitus    120s usually fasting -  dx more than 10 yrs ago  . Dizziness, nonspecific   . DVT (deep venous thrombosis) (HCC)    Recurrent  . Fibromyalgia   . GERD (gastroesophageal reflux disease)   . Heart murmur   . History of blood transfusion   . History of cocaine abuse ([place])    Remote history   . Hypertension    takes meds daily  . Hypothyroidism   . Lupus Anticoagulant Positive   . On home oxygen therapy    at night  . Shortness of breath    exertion or lying flat  . Sleep apnea    2l of oxygen at night (as of 12/6, she used to)    Allergies  Allergen Reactions  . Lisinopril Swelling    THROAT SWELLING Pt states her throat started to "close up" after being on it for "so long."  . Corticosteroids Rash    States she developed a rash on her tongue after a steroid injection in her bac    ROS General: Denies fever, chills,  night sweats, changes in weight, changes in appetite HEENT: Denies headaches, ear pain, changes in vision, rhinorrhea +sore throat, dysphagia CV: Denies CP, palpitations, SOB, orthopnea Pulm: Denies SOB, cough, wheezing GI: Denies abdominal pain, nausea, vomiting, diarrhea, constipation GU: Denies dysuria, hematuria, frequency, vaginal discharge Msk: Denies muscle cramps, joint pains  + muscle soreness/sensitivity Neuro: Denies weakness, numbness, tingling Skin: Denies rashes, bruising Psych: Denies depression, anxiety, hallucinations    Objective:    Blood pressure 140/84, pulse 90, temperature 98.1 F (36.7 C), temperature source Oral, weight 290 lb 12.8 oz (131.9 kg), SpO2 97 %.  Gen. Pleasant, well-nourished, in no distress, normal affect  HEENT: [place]/AT, face symmetric, conjunctiva clear, no scleral icterus, PERRLA, EOMI, nares patent without drainage, Mallampati score 4, pharynx without erythema or exudate. Neck: No JVD, 8 mm nodule in right lobe of thyroid, no carotid bruits Lungs: no accessory muscle use, CTAB, no wheezes or rales Cardiovascular: RRR, no m/r/g, no peripheral edema Abdomen: BS present, soft, NT/ND, no hepatosplenomegaly. Musculoskeletal: No deformities, no cyanosis or clubbing, normal tone Neuro:  A&Ox3, CN II-XII intact, normal gait Skin:  Warm, no lesions/ rash   Wt Readings from Last 3 Encounters:  06/07/20 290 lb 12.8 oz (131.9 kg)  05/14/20 286 lb 1.6 oz (129.8 kg)  04/16/20 293 lb (132.9 kg)    Lab Results  Component Value Date   WBC 7.0 05/14/2020   HGB 12.5 05/14/2020   HCT 39.3 05/14/2020   PLT 321 05/14/2020   GLUCOSE 179 (H) 04/07/2020   CHOL 194 02/02/2020   TRIG 196 (H) 02/02/2020   HDL 76 02/02/2020   LDLCALC 89 02/02/2020   ALT 21 02/02/2020   AST 18 02/02/2020   NA 139 04/07/2020   K 3.0 (L) 04/07/2020   CL 98 04/07/2020   CREATININE 0.89 04/07/2020   BUN 16 04/07/2020   CO2 27 04/07/2020   TSH 6.50 (H) 02/02/2020   INR 1.9  (H) 05/14/2020   HGBA1C 8.8 (H) 02/02/2020    Assessment/Plan:  Thyroid nodule  -Nodule right lobe noted on exam -We will obtain ultrasound and labs. -Further recommendations based on results. - Plan: US THYROID, TSH, T4, Free, CBC with Differential/Platelet, Basic metabolic panel  Sore throat -Discussed possible causes including viral etiology, thyroid related 2/2 mass or hypothyroidism.  Also consider vitamin deficiency - Plan: US THYROID, TSH, T4, Free, CBC with Differential/Platelet, Vitamin B12, Folate, Vitamin D, 25-hydroxy  Fibromyalgia -Restart gabapentin - Plan: gabapentin (NEURONTIN) 300 MG capsule, Vitamin B12, Folate, Vitamin D, 25-hydroxy  Obesity, diabetes, and hypertension syndrome (HCC)  -Hemoglobin A1c 8.8% on 02/02/2020 -Discussed importance of lifestyle modifications -Patient encouraged to check FSBS at home and keep a log to bring with her to clinic -Continue current medications - Plan: Basic metabolic panel, Vitamin Z99, Hemoglobin A1c  F/u prn in 3-4 months, sooner if needed  [name], MD

## 2020-06-08 ENCOUNTER — Other Ambulatory Visit: Payer: Self-pay | Admitting: Family Medicine

## 2020-06-08 DIAGNOSIS — Z1231 Encounter for screening mammogram for malignant neoplasm of breast: Secondary | ICD-10-CM

## 2020-06-10 ENCOUNTER — Inpatient Hospital Stay: Payer: Medicare Other | Attending: Oncology

## 2020-06-10 ENCOUNTER — Telehealth: Payer: Self-pay | Admitting: *Deleted

## 2020-06-10 ENCOUNTER — Other Ambulatory Visit: Payer: Self-pay

## 2020-06-10 DIAGNOSIS — Z86718 Personal history of other venous thrombosis and embolism: Secondary | ICD-10-CM | POA: Diagnosis not present

## 2020-06-10 DIAGNOSIS — Z7901 Long term (current) use of anticoagulants: Secondary | ICD-10-CM | POA: Diagnosis not present

## 2020-06-10 LAB — PROTIME-INR
INR: 1.3 — ABNORMAL HIGH (ref 0.8–1.2)
Prothrombin Time: 15.6 seconds — ABNORMAL HIGH (ref 11.4–15.2)

## 2020-06-10 NOTE — Telephone Encounter (Signed)
Notified patient that her INR was low. She denies any missed doses. Confirms 5 mg daily, except 10 mg MWF.  Per Dr. Benay Spice: stay on same dose and recheck in 2 weeks.

## 2020-06-10 NOTE — Telephone Encounter (Signed)
-----   Message from Ladell Pier, MD sent at 06/10/2020  1:28 PM EDT ----- Please call patient, PT/INR is subtherapeutic, is she taking her Coumadin, has she missed any doses

## 2020-06-11 ENCOUNTER — Telehealth: Payer: Self-pay | Admitting: Oncology

## 2020-06-11 ENCOUNTER — Ambulatory Visit (INDEPENDENT_AMBULATORY_CARE_PROVIDER_SITE_OTHER): Payer: Medicare Other | Admitting: Internal Medicine

## 2020-06-11 ENCOUNTER — Other Ambulatory Visit: Payer: Self-pay | Admitting: Family Medicine

## 2020-06-11 ENCOUNTER — Encounter: Payer: Self-pay | Admitting: Internal Medicine

## 2020-06-11 VITALS — BP 140/78 | HR 90 | Ht 66.0 in | Wt 290.2 lb

## 2020-06-11 DIAGNOSIS — I152 Hypertension secondary to endocrine disorders: Secondary | ICD-10-CM | POA: Diagnosis not present

## 2020-06-11 DIAGNOSIS — E1159 Type 2 diabetes mellitus with other circulatory complications: Secondary | ICD-10-CM | POA: Diagnosis not present

## 2020-06-11 DIAGNOSIS — Z6841 Body Mass Index (BMI) 40.0 and over, adult: Secondary | ICD-10-CM

## 2020-06-11 DIAGNOSIS — E669 Obesity, unspecified: Secondary | ICD-10-CM

## 2020-06-11 DIAGNOSIS — I5032 Chronic diastolic (congestive) heart failure: Secondary | ICD-10-CM | POA: Diagnosis not present

## 2020-06-11 DIAGNOSIS — E1169 Type 2 diabetes mellitus with other specified complication: Secondary | ICD-10-CM

## 2020-06-11 DIAGNOSIS — E559 Vitamin D deficiency, unspecified: Secondary | ICD-10-CM

## 2020-06-11 DIAGNOSIS — E876 Hypokalemia: Secondary | ICD-10-CM

## 2020-06-11 MED ORDER — FUROSEMIDE 40 MG PO TABS: 40.0000 mg | ORAL_TABLET | Freq: Every day | ORAL | 3 refills | Status: DC

## 2020-06-11 MED ORDER — VITAMIN D (ERGOCALCIFEROL) 1.25 MG (50000 UNIT) PO CAPS
50000.0000 [IU] | ORAL_CAPSULE | ORAL | 0 refills | Status: DC
Start: 2020-06-11 — End: 2020-10-07

## 2020-06-11 MED ORDER — POTASSIUM CHLORIDE ER 10 MEQ PO CPCR: 20.0000 meq | ORAL_CAPSULE | Freq: Every day | ORAL | 2 refills | Status: DC

## 2020-06-11 NOTE — Telephone Encounter (Signed)
Scheduled per sch msg. Called and spoke with patient. Confirmed date and time ° °

## 2020-06-11 NOTE — Patient Instructions (Signed)
Medication Instructions:  Your physician has recommended you make the following change in your medication:  INCREASE: furosemide (Lasix) 40 mg by mouth   *If you need a refill on your cardiac medications before your next appointment, please call your pharmacy*   Lab Work: IN 7-10 DAYS: BMP and MG If you have labs (blood work) drawn today and your tests are completely normal, you will receive your results only by: Marland Kitchen MyChart Message (if you have MyChart) OR . A paper copy in the mail If you have any lab test that is abnormal or we need to change your treatment, we will call you to review the results.   Testing/Procedures: NONE   Follow-Up: At [facility], you and your health needs are our priority.  As part of our continuing mission to provide you with exceptional heart care, we have created designated Provider Care Teams.  These Care Teams include your primary Cardiologist (physician) and Advanced Practice Providers (APPs -  Physician Assistants and Nurse Practitioners) who all work together to provide you with the care you need, when you need it.  We recommend signing up for the patient portal called "MyChart".  Sign up information is provided on this After Visit Summary.  MyChart is used to connect with patients for Virtual Visits (Telemedicine).  Patients are able to view lab/test results, encounter notes, upcoming appointments, etc.  Non-urgent messages can be sent to your provider as well.   To learn more about what you can do with MyChart, go to [email or web address].    Your next appointment:   6 month(s)  The format for your next appointment:   In Person  Provider:   You may see [name], MD or one of the following Advanced Practice Providers on your designated Care Team:    Melina Copa, PA-C  Ermalinda Barrios, PA-C

## 2020-06-11 NOTE — Progress Notes (Signed)
Cardiology Office Note:    Date:  06/11/2020   ID:  [name], DOB 11-01-1955, MRN [id number]  PCP:  Billie Ruddy, MD  [facility] Cardiologist:  [name] MD [place] Electrophysiologist:  None   CC: Follow up CCTA  History of Present Illness:    [name] is a 66 y.o. 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  In interim had ziopatch with no concerning findings.  Last seen 04/13/20.  In interim of this visit, patient CCTA and Echo- notable from improved LAP and aortic atherosclerosis, and a hiatal hernia  Patient notes that she is doing well.  Since last visit notes changes.  Relevant interval testing or therapy include Echo and CCTA.  There are no interval hospital/ED visit.    No chest pain or pressure .  No SOB/DOE and no PND/Orthopnea.  No weight gain or leg swelling.  No palpitations or syncope .  Does note that she pulled up something in lower lip and has a possible cyst:  She discussed this with Dr. Volanda Napoleon.  Just gets tired.  Notes that she has lost weight.  Past Medical History:  Diagnosis Date  . Adrenal mass ([place]) 03/226   Benign  . Arthritis    knees/multiple orthopedic conditons; lower back  . Asthma    per pt  . 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 Depression   . Diabetes mellitus    120s usually fasting -  dx more than 10 yrs ago  . Dizziness, nonspecific   . DVT (deep venous thrombosis) (HCC)    Recurrent  . Fibromyalgia   . GERD (gastroesophageal reflux disease)   . Heart murmur   . History of blood transfusion   . History of cocaine abuse ([place])    Remote history   . Hypertension    takes meds daily  . Hypothyroidism   .  Lupus Anticoagulant Positive   . On home oxygen therapy    at night  . Shortness of breath    exertion or lying flat  . Sleep apnea    2l of oxygen at night (as of 12/6, she used to)    Past Surgical History:  Procedure Laterality Date  . ABDOMINAL HYSTERECTOMY  1989   Fibroids  . ANTERIOR LUMBAR FUSION  01/03/2012   Procedure: ANTERIOR LUMBAR FUSION 1 LEVEL;  Surgeon: Winfield Cunas, MD;  Location: [facility];  Service: Neurosurgery;  Laterality: N/A;  Lumbar Four-Five Anterior Lumbar Interbody Fusion with Instrumentation  . BACK SURGERY     cervical spine---disk disease  . CARPAL TUNNEL RELEASE     Bilateral  . CESAREAN SECTION    . CHOLECYSTECTOMY    . CYSTOSCOPY/RETROGRADE/URETEROSCOPY/STONE EXTRACTION WITH BASKET Right 04/26/2014   Procedure: CYSTOSCOPY/ RIGHT RETROGRADE/ RIGHT URETEROSCOPY/URETERAL AND RENAL PELVIS BIOPSY;  Surgeon: Alexis Frock, MD;  Location: [facility];  Service: Urology;  Laterality: Right;  . gallstones removed    . 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   bilateral  . right elbow surgery    . THYROID LOBECTOMY Right 02/27/2017  . THYROID LOBECTOMY Right 02/27/2017   Procedure: RIGHT THYROID LOBECTOMY;  Surgeon: Erroll Luna, MD;  Location: [facility];  Service: General;  Laterality: Right;  . TUBAL LIGATION      Current Medications: Current Meds  Medication Sig  . ACCU-CHEK GUIDE test strip USE UP TO FOUR TIMES DAILY AS DIRECTED.  . Accu-Chek Softclix Lancets lancets USE UP TO FOUR TIMES DAILY AS DIRECTED.  Marland Kitchen albuterol (PROVENTIL HFA;VENTOLIN HFA) 108 (90 BASE) MCG/ACT inhaler Inhale 2 puffs into the lungs every 4 (four) hours as needed for shortness of breath.  Marland Kitchen albuterol (PROVENTIL) (2.5 MG/3ML) 0.083% nebulizer solution USE 1 VIAL IN NEBULIZER EVERY 6 HOURS - and as needed  . amLODipine (NORVASC) 5 MG tablet Take 5 mg by mouth daily.  Jearl Klinefelter ELLIPTA 62.5-25 MCG/INH AEPB Inhale 1 puff into the lungs daily.   . benzonatate (TESSALON) 100 MG capsule  Take 100 mg by mouth 3 (three) times daily as needed.  . blood glucose meter kit and supplies KIT Dispense based on patient and insurance preference. Use up to four times daily as directed. (FOR ICD-9 250.00, 250.01).  . canagliflozin (INVOKANA) 100 MG TABS tablet Take 1 tablet (100 mg total) by mouth daily before breakfast.  . CARTIA XT 240 MG 24 hr capsule Take 240 mg by mouth daily.  . cloNIDine (CATAPRES) 0.1 MG tablet Take 0.1 mg by mouth 2 (two) times daily.  . diclofenac sodium (VOLTAREN) 1 % GEL Apply 2 g topically daily as needed (fibromyalgia pain).  . DULoxetine (CYMBALTA) 20 MG capsule Take 20 mg by mouth daily.  . DULoxetine (CYMBALTA) 60 MG capsule Take 60 mg by mouth at bedtime.   . fluticasone (FLONASE) 50 MCG/ACT nasal spray Place 1 spray into both nostrils daily.   . furosemide (LASIX) 40 MG tablet Take 1 tablet (40 mg total) by mouth daily.  Marland Kitchen gabapentin (NEURONTIN) 300 MG capsule Take 1 capsule (300 mg total) by mouth at bedtime.  Marland Kitchen levocetirizine (XYZAL) 5 MG tablet Take 5 mg by mouth at bedtime.  Marland Kitchen LINZESS 145 MCG CAPS capsule Take 145 mcg by mouth every other day.   . meclizine (ANTIVERT) 25 MG tablet Take 1 tablet (25 mg total) by mouth 3 (three) times daily as needed for dizziness.  . metFORMIN (GLUCOPHAGE) 500 MG tablet Take 500 mg by mouth at bedtime.  . metoCLOPramide (REGLAN) 10 MG tablet Take 1 tablet (10 mg total) by mouth every 6 (six) hours as needed for nausea (or headache).  . metoprolol succinate (TOPROL-XL) 100 MG 24 hr tablet Take 1 tablet (100 mg total) by mouth as directed. Take with or immediately following a meal.  . omeprazole (PRILOSEC) 20 MG capsule Take 20 mg by mouth 2 (two) times daily.  . pravastatin (PRAVACHOL) 40 MG tablet Take 1 tablet (40 mg total) by mouth every evening.  Marland Kitchen QUEtiapine (SEROQUEL) 50 MG tablet Take 50 mg by mouth at bedtime.   Marland Kitchen REXULTI 2 MG TABS tablet Take 2 mg by mouth daily.  . SYMBICORT 160-4.5 MCG/ACT inhaler SMARTSIG:2  Puff(s) By Mouth Twice Daily  . tiZANidine (ZANAFLEX) 4 MG tablet Take 4 mg by mouth 2 (two) times daily as needed for muscle spasms.  Marland Kitchen warfarin (COUMADIN) 10 MG tablet TAKE 1 TABLET BY MOUTH ON MONDAY, WEDNESDAY, FRIDAY  . warfarin (COUMADIN) 5 MG tablet TAKE 1 TABLET BY MOUTH DAILY EXCEPT ON MONDAY, WEDNESDAY, AND FRIDAY OR AS DIRECTED.  Marland Kitchen zonisamide (ZONEGRAN) 100 MG capsule Take 1 capsule by mouth as directed. Take 1 tablet in the AM and 2 tablets in the PM  . [DISCONTINUED] furosemide (LASIX) 20 MG tablet TAKE  1 TABLET(20 MG) BY MOUTH DAILY    Allergies:   Lisinopril and Corticosteroids   Social History   Socioeconomic History  . Marital status: Divorced    Spouse name: Not on file  . Number of children: Not on file  . Years of education: Not on file  . Highest education level: Not on file  Occupational History  . Not on file  Tobacco Use  . Smoking status: Never Smoker  . Smokeless tobacco: Never Used  Vaping Use  . Vaping Use: Never used  Substance and Sexual Activity  . Alcohol use: No    Alcohol/week: 0.0 standard drinks    Comment: beer in past   . Drug use: Yes    Types: Cocaine    Comment: quit 2003-rehab progam in 2005  . Sexual activity: Never  Other Topics Concern  . Not on file  Social History Narrative   Lives alone in apartment, drives   Social Determinants of Health   Financial Resource Strain: Low Risk   . Difficulty of Paying Living Expenses: Not hard at all  Food Insecurity: No Food Insecurity  . Worried About Charity fundraiser in the Last Year: Never true  . Ran Out of Food in the Last Year: Never true  Transportation Needs: No Transportation Needs  . Lack of Transportation (Medical): No  . Lack of Transportation (Non-Medical): No  Physical Activity: Inactive  . Days of Exercise per Week: 0 days  . Minutes of Exercise per Session: 0 min  Stress: No Stress Concern Present  . Feeling of Stress : Not at all  Social Connections: Moderately  Isolated  . Frequency of Communication with Friends and Family: More than three times a week  . Frequency of Social Gatherings with Friends and Family: More than three times a week  . Attends Religious Services: More than 4 times per year  . Active Member of Clubs or Organizations: No  . Attends Archivist Meetings: Never  . Marital Status: Divorced    Family History: The patient's family history includes Cancer in her mother; Diabetes in her mother; Heart failure in her father; Hypertension in her father and mother. History of coronary artery disease notable for no members. History of heart failure notable for both parents. History of arrhythmia notable for no members. Denies family history of sudden cardiac death including drowning, car accidents, or unexplained deaths in the family. Mother->heart murmur  ROS:   Please see the history of present illness.    All other systems reviewed and are negative.  EKGs/Labs/Other Studies Reviewed:    The following studies were reviewed today:  EKG:  04/13/20: SR 92 LVH 02/02/20:  SR Rate 85 LVH with secondary repolarization  Transthoracic Echocardiogram Date:05/04/20  Results:   IMPRESSIONS  1. Left ventricular ejection fraction, by estimation, is 60 to 65%. The  left ventricle has normal function. The left ventricle has no regional  wall motion abnormalities. Left ventricular diastolic parameters are  consistent with Grade I diastolic  dysfunction (impaired relaxation).  2. Right ventricular systolic function is normal. The right ventricular  size is normal.  3. The mitral valve is normal in structure. No evidence of mitral valve  regurgitation. No evidence of mitral stenosis.  4. The aortic valve is normal in structure. Aortic valve regurgitation is  not visualized.    Cardiac Monitor: Date: 03/25/20 Results:   Patient had a minimum heart rate of 67 bpm, maximum heart rate of 164 bpm, and average heart  rate of 88  bpm.  Predominant underlying rhythm was sinus rhythm.  One run of ventricular tachycardia occurred lasting 5 beats at longest with a max rate of 164 bpm at fastest.  Thirteen runs of supraventricular tachycardia occurred lasting 14 beats at longest with a max rate of 162 bpm at fastest.  Isolated PACs were rare (<1.0%), with rare couplets and triplets present.  Isolated PVCs were rare (<1.0%), with rare couplets and triplets present; bigeminy and trigeminy present.  No evidence of complete heart block .  Triggered and diary events associated overwhelming with sinus rhythm or sinus tachycardia; once with PVC.   No malignant arrhythmias.   CardiacCT : Date:05/07/20 Results: IMPRESSION: 1. Coronary calcium score of 0. This was 1st percentile for age, sex, and race matched control. 2. Normal coronary origin with right dominance. 3. CAD-RADS 0. No evidence of CAD (0%). Consider non-atherosclerotic causes of discomfort.   IMPRESSION: 1. No acute findings in the imaged extracardiac chest. 2. Hepatic steatosis. 3. Small hiatal hernia.  Recent Labs: 07/09/2019: Pro B Natriuretic peptide (BNP) 18.0 02/02/2020: ALT 21; Brain Natriuretic Peptide 11 06/07/2020: BUN 16; Creatinine, Ser 0.71; Hemoglobin 11.9; Platelets 285.0; Potassium 3.4; Sodium 138; TSH 4.23  Recent Lipid Panel    Component Value Date/Time   CHOL 194 02/02/2020 1126   TRIG 196 (H) 02/02/2020 1126   HDL 76 02/02/2020 1126   CHOLHDL 2.6 02/02/2020 1126   VLDL 20 04/24/2014 2245   LDLCALC 89 02/02/2020 1126   Risk Assessment/Calculations:     N/A  Physical Exam:    VS:  BP 140/78   Pulse 90   Ht 5' 6"  (1.676 m)   Wt 290 lb 3.2 oz (131.6 kg)   SpO2 98%   BMI 46.84 kg/m     Wt Readings from Last 3 Encounters:  06/11/20 290 lb 3.2 oz (131.6 kg)  06/07/20 290 lb 12.8 oz (131.9 kg)  05/14/20 286 lb 1.6 oz (129.8 kg)    GEN: Obese female, well developed with no acute distress HEENT: Normal NECK: No JVD  (difficult exam); No carotid bruits LYMPHATICS: No lymphadenopathy CARDIAC: RRR, no murmurs, rubs, gallops RESPIRATORY:  Clear to auscultation without rales, wheezing or rhonchi; air movement better than expected ABDOMEN: Soft, non-tender, non-distended MUSCULOSKELETAL:  No pitting edema; No deformity  SKIN: Warm and dry NEUROLOGIC:  Alert and oriented x 3 PSYCHIATRIC:  Normal affect   ASSESSMENT:    1. Obesity, diabetes, and hypertension syndrome ([place])   2. Chronic diastolic CHF (congestive heart failure) ([place])   3. Morbid obesity with BMI of 45.0-49.9, adult ([place])   4. Chronic heart failure with preserved ejection fraction (HCC)    PLAN:    In order of problems listed above:  Heart Failure Preserved Ejection Fraction  In the setting of Morbid Obesity, COPD and OSA overlap DVT on chronic AC - NYHA class II, Stage B-C, slightly hypervolemic, etiology in the setting of DM/HTN - Diuretic regimen:Continue Invokana, will increase lasix to 40 mg PO daily and get BMP and Mg in one week  Morbid Obesity/DM/HTN OSA on CPAP - ambulatory blood pressure 140/80, will continue ambulatory BP monitoring; gave education on how to perform ambulatory blood pressure monitoring including the frequency and technique; [place] ambulatory blood pressure < 135/85 on average; if she needs help getting a cuff can reach out to our social worked for assistance - continue home medications except above - OSA and is on CPAP follow by OSH Sleep Medicine - discussed diet (DASH/low sodium),  and exercise/weight loss interventions   HLD -Reviewed CT with patient - given no significant aortic atherosclerosis on review with PT; will set LDL Wages < 100  Asx SVT on heart monitor - no AF; no symptomatic SVT - AV Nodal Therapy: continue diltiazem  Six months follow up unless new symptoms or abnormal test results warranting change in plan  Would be reasonable for  Video Visit Follow up Would be reasonable for  APP  Follow up   Medication Adjustments/Labs and Tests Ordered: Current medicines are reviewed at length with the patient today.  Concerns regarding medicines are outlined above.  Orders Placed This Encounter  Procedures  . Basic metabolic panel  . Magnesium   Meds ordered this encounter  Medications  . furosemide (LASIX) 40 MG tablet    Sig: Take 1 tablet (40 mg total) by mouth daily.    Dispense:  90 tablet    Refill:  3    Patient Instructions  Medication Instructions:  Your physician has recommended you make the following change in your medication:  INCREASE: furosemide (Lasix) 40 mg by mouth   *If you need a refill on your cardiac medications before your next appointment, please call your pharmacy*   Lab Work: IN 7-10 DAYS: BMP and MG If you have labs (blood work) drawn today and your tests are completely normal, you will receive your results only by: Marland Kitchen MyChart Message (if you have MyChart) OR . A paper copy in the mail If you have any lab test that is abnormal or we need to change your treatment, we will call you to review the results.   Testing/Procedures: NONE   Follow-Up: At [facility], you and your health needs are our priority.  As part of our continuing mission to provide you with exceptional heart care, we have created designated Provider Care Teams.  These Care Teams include your primary Cardiologist (physician) and Advanced Practice Providers (APPs -  Physician Assistants and Nurse Practitioners) who all work together to provide you with the care you need, when you need it.  We recommend signing up for the patient portal called "MyChart".  Sign up information is provided on this After Visit Summary.  MyChart is used to connect with patients for Virtual Visits (Telemedicine).  Patients are able to view lab/test results, encounter notes, upcoming appointments, etc.  Non-urgent messages can be sent to your provider as well.   To learn more about what you can do  with MyChart, go to [email or web address].    Your next appointment:   6 month(s)  The format for your next appointment:   In Person  Provider:   You may see [name], MD or one of the following Advanced Practice Providers on your designated Care Team:    Melina Copa, PA-C  Ermalinda Barrios, PA-C        Signed, Werner Lean, MD  06/11/2020 11:43 AM    [place]

## 2020-06-14 ENCOUNTER — Ambulatory Visit (INDEPENDENT_AMBULATORY_CARE_PROVIDER_SITE_OTHER): Payer: Medicare Other

## 2020-06-14 ENCOUNTER — Other Ambulatory Visit: Payer: Self-pay

## 2020-06-14 DIAGNOSIS — E538 Deficiency of other specified B group vitamins: Secondary | ICD-10-CM

## 2020-06-14 MED ORDER — CYANOCOBALAMIN 1000 MCG/ML IJ SOLN
1000.0000 ug | Freq: Once | INTRAMUSCULAR | Status: AC
  Administered 2020-06-14: 1000 ug via INTRAMUSCULAR

## 2020-06-14 NOTE — Progress Notes (Signed)
Patient was given vitamin B12 injection in right deltoid, tolerated well.

## 2020-06-16 ENCOUNTER — Other Ambulatory Visit: Payer: Self-pay

## 2020-06-17 DIAGNOSIS — J449 Chronic obstructive pulmonary disease, unspecified: Secondary | ICD-10-CM | POA: Diagnosis not present

## 2020-06-18 ENCOUNTER — Other Ambulatory Visit: Payer: Self-pay

## 2020-06-18 ENCOUNTER — Telehealth: Payer: Self-pay | Admitting: Internal Medicine

## 2020-06-18 ENCOUNTER — Other Ambulatory Visit: Payer: Medicare Other | Admitting: *Deleted

## 2020-06-18 DIAGNOSIS — I5032 Chronic diastolic (congestive) heart failure: Secondary | ICD-10-CM

## 2020-06-18 LAB — MAGNESIUM: Magnesium: 1.8 mg/dL (ref 1.6–2.3)

## 2020-06-18 LAB — BASIC METABOLIC PANEL
BUN/Creatinine Ratio: 20 (ref 12–28)
BUN: 16 mg/dL (ref 8–27)
CO2: 23 mmol/L (ref 20–29)
Calcium: 9.8 mg/dL (ref 8.7–10.3)
Chloride: 99 mmol/L (ref 96–106)
Creatinine, Ser: 0.82 mg/dL (ref 0.57–1.00)
Glucose: 183 mg/dL — ABNORMAL HIGH (ref 65–99)
Potassium: 3.9 mmol/L (ref 3.5–5.2)
Sodium: 140 mmol/L (ref 134–144)
eGFR: 79 mL/min/{1.73_m2} (ref >59)

## 2020-06-18 NOTE — Telephone Encounter (Signed)
Sent mychart message advising lab work drawn this morning will not be resulted and reviewed for around 24 hours.

## 2020-06-18 NOTE — Telephone Encounter (Signed)
Pt is requesting a call back to explain her lab results from this morning. Please advise

## 2020-06-25 ENCOUNTER — Inpatient Hospital Stay: Payer: Medicare Other

## 2020-06-28 ENCOUNTER — Inpatient Hospital Stay: Payer: Medicare Other

## 2020-06-29 ENCOUNTER — Ambulatory Visit
Admission: RE | Admit: 2020-06-29 | Discharge: 2020-06-29 | Disposition: A | Discharge status: Home / Self Care | Payer: Medicare Other | Source: Ambulatory Visit | Attending: Family Medicine | Admitting: Family Medicine

## 2020-06-29 DIAGNOSIS — E041 Nontoxic single thyroid nodule: Secondary | ICD-10-CM

## 2020-06-29 DIAGNOSIS — E890 Postprocedural hypothyroidism: Secondary | ICD-10-CM | POA: Diagnosis not present

## 2020-06-29 DIAGNOSIS — Z9889 Other specified postprocedural states: Secondary | ICD-10-CM | POA: Diagnosis not present

## 2020-06-29 DIAGNOSIS — J029 Acute pharyngitis, unspecified: Secondary | ICD-10-CM

## 2020-07-01 IMAGING — MG DIGITAL SCREENING BILATERAL MAMMOGRAM WITH TOMO AND CAD
8 series · 8 of 24 positions shown · non-contrast
Comparison: Previous exam(s).

CLINICAL DATA: Screening.

EXAM:
DIGITAL SCREENING BILATERAL MAMMOGRAM WITH TOMO AND CAD

[R CC synth-2D]
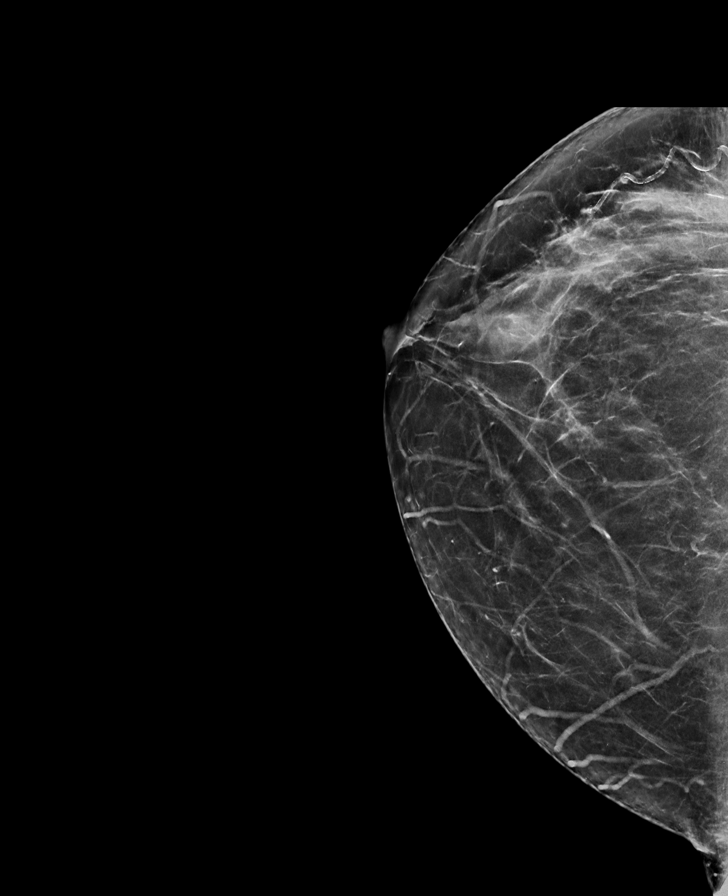

[R MLO synth-2D]
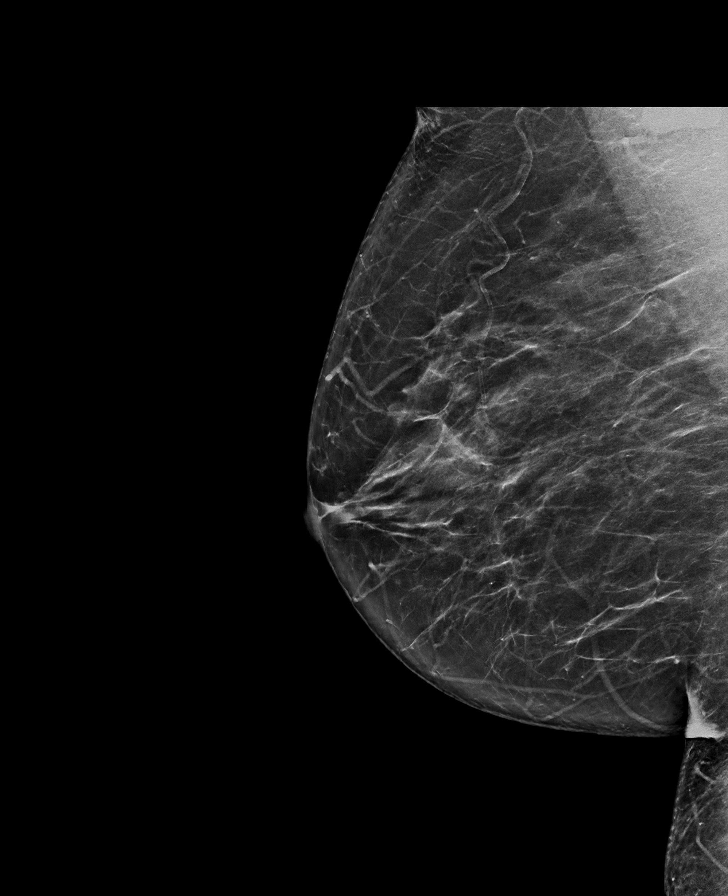

[L MLO synth-2D]
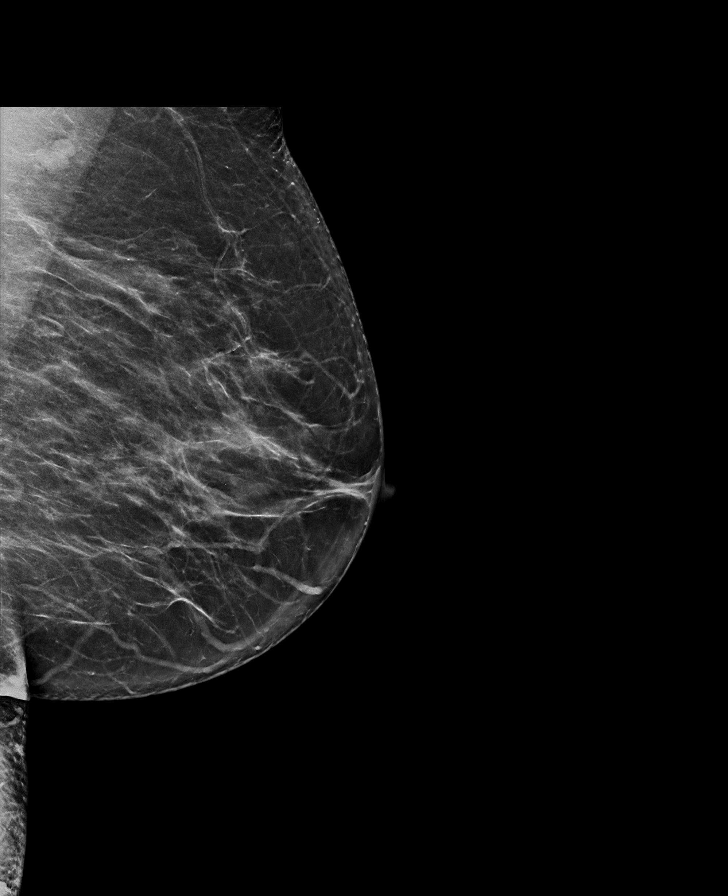

[L CC synth-2D]
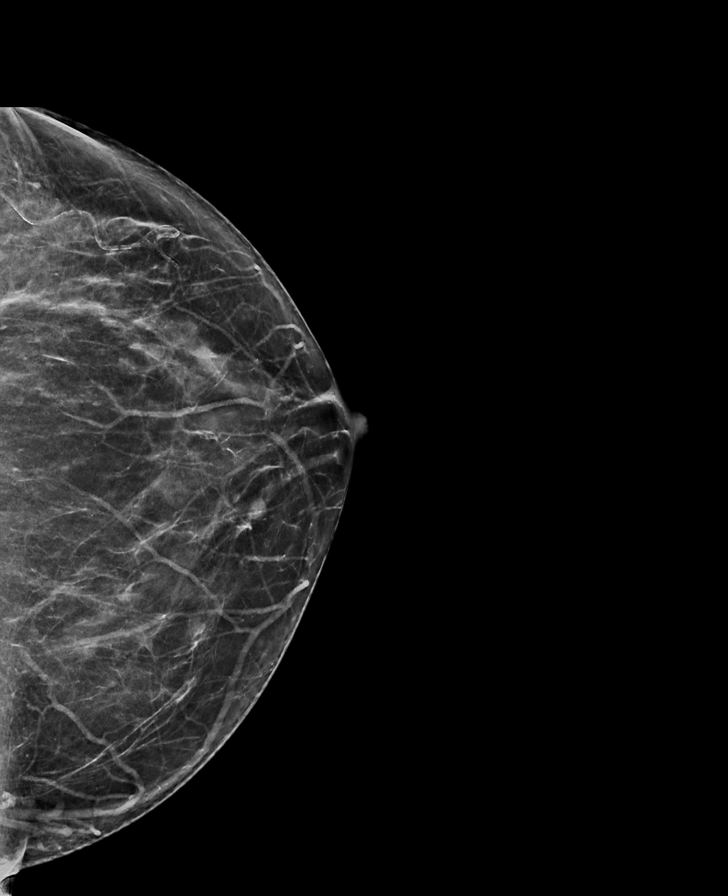

[R MLO tomo · tomo slice 40/79.0]
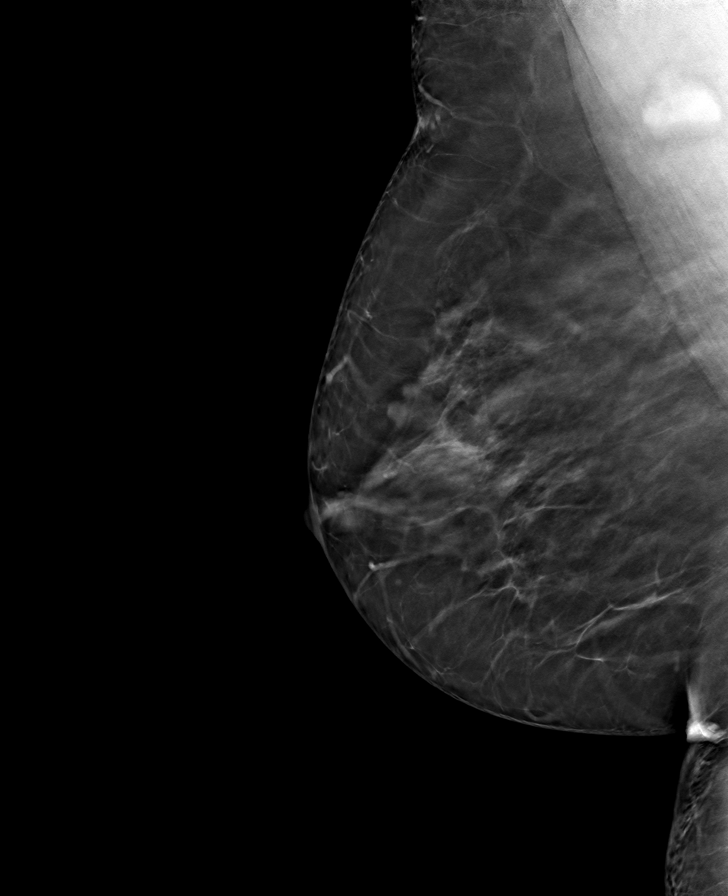

[L CC tomo · tomo slice 35/69.0]
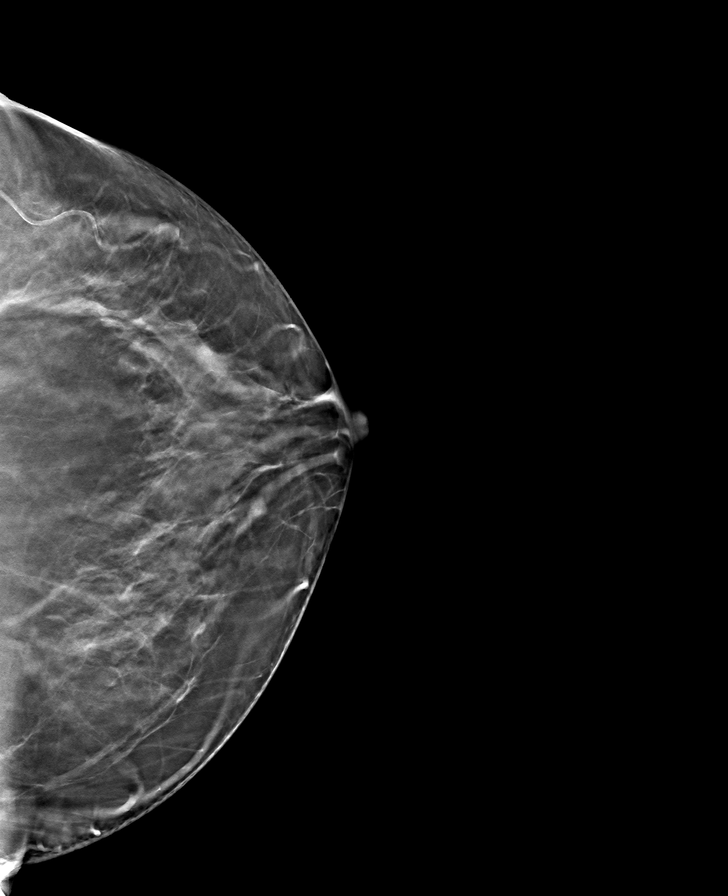

[R CC tomo · tomo slice 35/70.0]
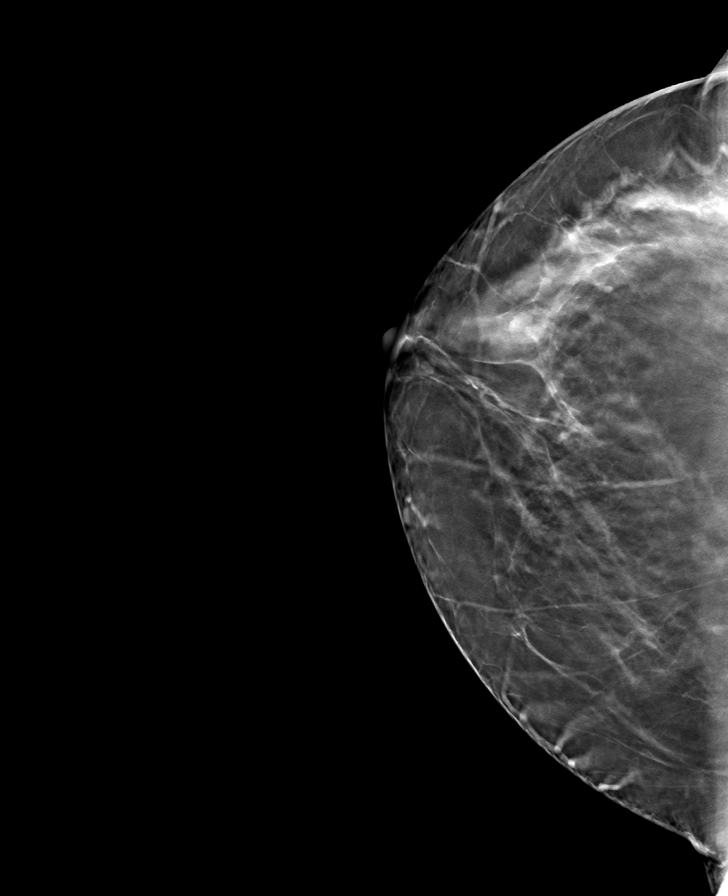

[L MLO tomo · tomo slice 39/77.0]
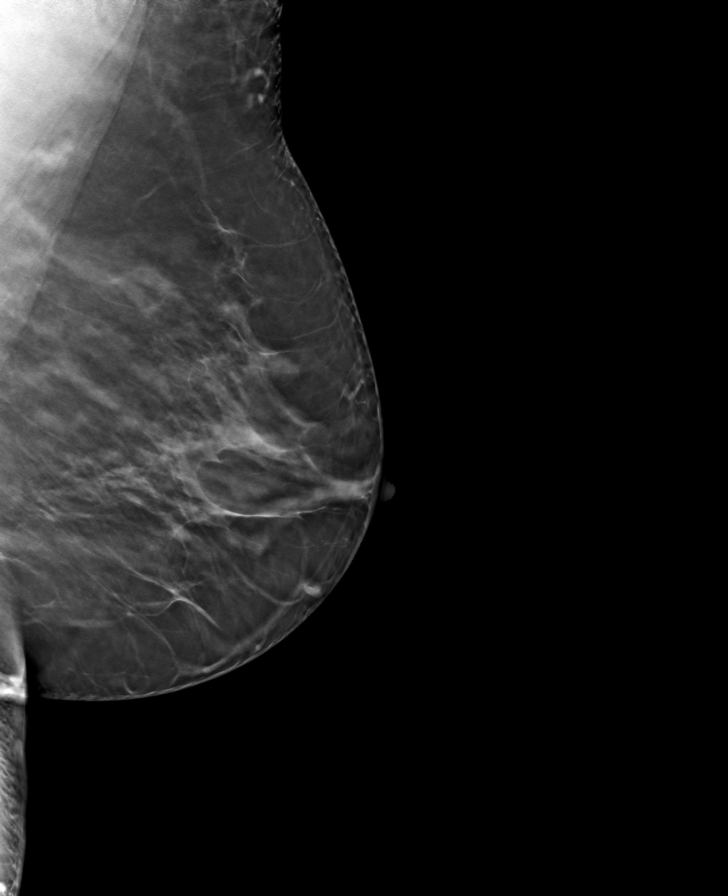

[8 of 24 positions shown; findings below may reference images not displayed]

ACR Breast Density Category c: The breast tissue is heterogeneously
dense, which may obscure small masses.
FINDINGS: There are no findings suspicious for malignancy. Images were
processed with CAD.
IMPRESSION: No mammographic evidence of malignancy. A result letter of this
screening mammogram will be mailed directly to the patient.

RECOMMENDATION:
Screening mammogram in one year. (Code:[id number])

BI-RADS CATEGORY  1: Negative.

## 2020-07-05 ENCOUNTER — Telehealth: Payer: Self-pay | Admitting: Family Medicine

## 2020-07-05 NOTE — Telephone Encounter (Signed)
LINZESS [place] capsule  [facility] [address], [place] Phone:  [phone]  Fax:  [phone]

## 2020-07-05 NOTE — Telephone Encounter (Signed)
Patient is scheduled for a telephone visit 04/20 at 10 AM

## 2020-07-07 ENCOUNTER — Encounter: Payer: Self-pay | Admitting: Family Medicine

## 2020-07-07 ENCOUNTER — Telehealth (INDEPENDENT_AMBULATORY_CARE_PROVIDER_SITE_OTHER): Payer: Medicare Other | Admitting: Family Medicine

## 2020-07-07 DIAGNOSIS — E1159 Type 2 diabetes mellitus with other circulatory complications: Secondary | ICD-10-CM

## 2020-07-07 DIAGNOSIS — E1169 Type 2 diabetes mellitus with other specified complication: Secondary | ICD-10-CM | POA: Diagnosis not present

## 2020-07-07 DIAGNOSIS — I5032 Chronic diastolic (congestive) heart failure: Secondary | ICD-10-CM | POA: Diagnosis not present

## 2020-07-07 DIAGNOSIS — G4733 Obstructive sleep apnea (adult) (pediatric): Secondary | ICD-10-CM

## 2020-07-07 DIAGNOSIS — Z9989 Dependence on other enabling machines and devices: Secondary | ICD-10-CM

## 2020-07-07 DIAGNOSIS — I152 Hypertension secondary to endocrine disorders: Secondary | ICD-10-CM

## 2020-07-07 DIAGNOSIS — E041 Nontoxic single thyroid nodule: Secondary | ICD-10-CM | POA: Diagnosis not present

## 2020-07-07 DIAGNOSIS — K5900 Constipation, unspecified: Secondary | ICD-10-CM | POA: Diagnosis not present

## 2020-07-07 DIAGNOSIS — E669 Obesity, unspecified: Secondary | ICD-10-CM

## 2020-07-07 NOTE — Progress Notes (Signed)
Virtual Visit via Telephone Note  I connected with [name] on 07/07/20 at 10:00 AM EDT by telephone and verified that I am speaking with the correct person using two identifiers.   I discussed the limitations, risks, security and privacy concerns of performing an evaluation and management service by telephone and the availability of in person appointments. I also discussed with the patient that there may be a patient responsible charge related to this service. The patient expressed understanding and agreed to proceed.  Location patient: home Location provider: work or home office Participants present for the call: patient, provider Patient did not have a visit in the prior 7 days to address this/these issue(s).   History of Present Illness: Pt feels SOB and tired with increased activity.  Pt with LLE edema.  States her inhaler helps a little bit.  Pt notices SOB if goes up stairs.  Unable to babysit her grandkids 2/2 having to go up 16 stairs.  Pt states when she wears her the CPAP it makes her cough.  She will take it off throughout the night.  Pt notes difficulty breathing with CPAP since having COVID.  Pt has not been checking fsbs.  Has testing supplies.    Needs to schedule f/u with Oncology.  Pt states appetite comes and goes.  Eating mostly jello.  Was on linzess.  Notes "bowels won't move" if she does not have linzess.     Observations/Objective: Patient sounds cheerful and well on the phone. I do not appreciate any SOB. Speech and thought processing are grossly intact. Patient reported vitals:  Assessment and Plan: Thyroid nodule -A 1.5 cm solid nodule noted in left lobe of thyroid on ultrasound from 06/29/2020 -Aspiration recommended - Plan: [place] FNA BX THYROID 1ST LESION AFIRMA  OSA on CPAP -Discussed follow-up with pulmonology/sleep medicine -May benefit from new mask  Chronic diastolic CHF (congestive heart failure) ([place]) -Likely contributing to SOB/DOE and  LE edema -Lifestyle modifications encouraged -NYHA class II, stage B-C HFpEF -Continue Lasix 40 mg daily -follow-up with cardiology advised  Constipation, unspecified constipation type -We will contact patient's pharmacy in regards to [place] -Discussed lifestyle modifications to help with constipation -Consider GI follow-up.  Obesity, diabetes, hypertension syndrome ([place]) -Lifestyle modification strongly encouraged. -Patient encouraged to start checking FSBS regularly -Continue current medications   Follow Up Instructions:  Prn in a few wks.   this patient for an OV in the next 24 hours for this/these issue(s).  I discussed the assessment and treatment plan with the patient. The patient was provided an opportunity to ask questions and all were answered. The patient agreed with the plan and demonstrated an understanding of the instructions.   The patient was advised to call back or seek an in-person evaluation if the symptoms worsen or if the condition fails to improve as anticipated.  I provided 16 minutes of non-face-to-face time during this encounter.   Billie Ruddy, MD

## 2020-07-08 ENCOUNTER — Other Ambulatory Visit: Payer: Self-pay

## 2020-07-09 NOTE — Telephone Encounter (Signed)
Patient is calling back and stated that she hasn't received her medication Linzess and would like a call back. Pt uses  [id number] - [place] [address], [place] Phone:  [phone]  Fax:  [phone]

## 2020-07-12 ENCOUNTER — Inpatient Hospital Stay: Payer: Medicare Other | Attending: Oncology

## 2020-07-12 ENCOUNTER — Telehealth: Payer: Self-pay

## 2020-07-12 ENCOUNTER — Other Ambulatory Visit: Payer: Self-pay

## 2020-07-12 DIAGNOSIS — J449 Chronic obstructive pulmonary disease, unspecified: Secondary | ICD-10-CM | POA: Diagnosis not present

## 2020-07-12 DIAGNOSIS — Z86718 Personal history of other venous thrombosis and embolism: Secondary | ICD-10-CM | POA: Insufficient documentation

## 2020-07-12 DIAGNOSIS — Z7901 Long term (current) use of anticoagulants: Secondary | ICD-10-CM | POA: Diagnosis not present

## 2020-07-12 LAB — PROTIME-INR
INR: 1.9 — ABNORMAL HIGH (ref 0.8–1.2)
Prothrombin Time: 21.7 seconds — ABNORMAL HIGH (ref 11.4–15.2)

## 2020-07-12 NOTE — Telephone Encounter (Signed)
-----   Message from Ladell Pier, MD sent at 07/12/2020  3:42 PM EDT ----- Please call patient, PT/INR is better, continue Coumadin at current dose, repeat PT/INR in 1 month

## 2020-07-12 NOTE — Telephone Encounter (Signed)
Attempted to reach pt to discuss most recent PT/INR there was no answer this nurse calls daughter A. Joya Gaskins did speak with her made her aware of labs and continued order for coumadin at current dose

## 2020-07-14 ENCOUNTER — Other Ambulatory Visit: Payer: Self-pay

## 2020-07-14 ENCOUNTER — Other Ambulatory Visit: Payer: Self-pay | Admitting: Family Medicine

## 2020-07-14 ENCOUNTER — Ambulatory Visit
Admission: RE | Admit: 2020-07-14 | Discharge: 2020-07-14 | Disposition: A | Discharge status: Home / Self Care | Payer: Medicare Other | Source: Ambulatory Visit | Attending: Family Medicine | Admitting: Family Medicine

## 2020-07-14 DIAGNOSIS — E041 Nontoxic single thyroid nodule: Secondary | ICD-10-CM

## 2020-07-19 ENCOUNTER — Other Ambulatory Visit: Payer: Self-pay

## 2020-07-19 ENCOUNTER — Ambulatory Visit (INDEPENDENT_AMBULATORY_CARE_PROVIDER_SITE_OTHER): Payer: Medicare Other | Admitting: *Deleted

## 2020-07-19 DIAGNOSIS — E538 Deficiency of other specified B group vitamins: Secondary | ICD-10-CM

## 2020-07-19 MED ORDER — CYANOCOBALAMIN 1000 MCG/ML IJ SOLN
1000.0000 ug | Freq: Once | INTRAMUSCULAR | Status: AC
  Administered 2020-07-19: 1000 ug via INTRAMUSCULAR

## 2020-07-19 NOTE — Progress Notes (Signed)
Per orders of Dr. Banks, injection of Cyanocobalamin 1000mcg given by [place] A. °Patient tolerated injection well. ° °

## 2020-07-21 ENCOUNTER — Telehealth: Payer: Self-pay

## 2020-07-21 NOTE — Progress Notes (Signed)
Spoke with patient, is aware. 

## 2020-07-22 DIAGNOSIS — J301 Allergic rhinitis due to pollen: Secondary | ICD-10-CM | POA: Diagnosis not present

## 2020-07-22 DIAGNOSIS — J4540 Moderate persistent asthma, uncomplicated: Secondary | ICD-10-CM | POA: Diagnosis not present

## 2020-07-22 DIAGNOSIS — J3089 Other allergic rhinitis: Secondary | ICD-10-CM | POA: Diagnosis not present

## 2020-08-02 ENCOUNTER — Ambulatory Visit: Payer: Medicare Other

## 2020-08-05 DIAGNOSIS — J449 Chronic obstructive pulmonary disease, unspecified: Secondary | ICD-10-CM | POA: Diagnosis not present

## 2020-08-10 ENCOUNTER — Other Ambulatory Visit: Payer: Self-pay

## 2020-08-11 ENCOUNTER — Ambulatory Visit (INDEPENDENT_AMBULATORY_CARE_PROVIDER_SITE_OTHER): Payer: Medicare Other | Admitting: Family Medicine

## 2020-08-11 ENCOUNTER — Encounter: Payer: Self-pay | Admitting: Family Medicine

## 2020-08-11 VITALS — BP 126/90 | HR 75 | Temp 97.6°F | Wt 287.8 lb

## 2020-08-11 DIAGNOSIS — R300 Dysuria: Secondary | ICD-10-CM

## 2020-08-11 DIAGNOSIS — E1169 Type 2 diabetes mellitus with other specified complication: Secondary | ICD-10-CM

## 2020-08-11 DIAGNOSIS — M545 Low back pain, unspecified: Secondary | ICD-10-CM

## 2020-08-11 DIAGNOSIS — E669 Obesity, unspecified: Secondary | ICD-10-CM | POA: Diagnosis not present

## 2020-08-11 DIAGNOSIS — K5900 Constipation, unspecified: Secondary | ICD-10-CM | POA: Diagnosis not present

## 2020-08-11 LAB — POCT URINALYSIS DIPSTICK
Bilirubin, UA: NEGATIVE
Blood, UA: NEGATIVE
Glucose, UA: POSITIVE — AB
Ketones, UA: NEGATIVE
Leukocytes, UA: NEGATIVE
Nitrite, UA: NEGATIVE
Protein, UA: POSITIVE — AB
Spec Grav, UA: 1.015 (ref 1.010–1.025)
Urobilinogen, UA: NEGATIVE E.U./dL — AB
pH, UA: 6.5 (ref 5.0–8.0)

## 2020-08-11 LAB — MICROALBUMIN / CREATININE URINE RATIO
Creatinine,U: 71.6 mg/dL
Microalb Creat Ratio: 17.4 mg/g (ref 0.0–30.0)
Microalb, Ur: 12.5 mg/dL — ABNORMAL HIGH (ref 0.0–1.9)

## 2020-08-11 LAB — GLUCOSE, POCT (MANUAL RESULT ENTRY): POC Glucose: 152 mg/dl — AB (ref 70–99)

## 2020-08-11 MED ORDER — LINZESS 145 MCG PO CAPS: 145.0000 ug | ORAL_CAPSULE | ORAL | 2 refills | Status: DC

## 2020-08-11 MED ORDER — TIZANIDINE HCL 4 MG PO TABS: 4.0000 mg | ORAL_TABLET | Freq: Four times a day (QID) | ORAL | 0 refills | Status: DC | PRN

## 2020-08-11 NOTE — Progress Notes (Signed)
Subjective:    Patient ID: [name], female    DOB: Feb 21, 1956, 65 y.o.   MRN: [id number]  Chief Complaint  Patient presents with  . Back Pain    Right sided low back pain, constant, can not get any sleep. Taken tylenol, did alcohol rub and no help. Hurts when has to wipe after bathroom use. started 3 days ago.    HPI Patient was seen today for acute concern as mentioned above.  Patient endorses waking up with right-sided low back pain 3 days ago.  Patient does not recall any injury.  Does mention cleaning the stove the day prior.  Patient tried Tylenol and heat for her symptoms which have not resolved.  Pain is noted as sharp, constant and does not radiate.  Patient endorses dysuria starting 1 day ago.  Patient denies fever, chills, nausea, vomiting, hematuria, blood in stools, rash.  Past Medical History:  Diagnosis Date  . Adrenal mass ([place]) 03/226   Benign  . Arthritis    knees/multiple orthopedic conditons; lower back  . Asthma    per pt  . 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 Depression   . Diabetes mellitus    120s usually fasting -  dx more than 10 yrs ago  . Dizziness, nonspecific   . DVT (deep venous thrombosis) (HCC)    Recurrent  . Fibromyalgia   . GERD (gastroesophageal reflux disease)   . Heart murmur   . History of blood transfusion   . History of cocaine abuse ([place])    Remote history   . Hypertension    takes meds daily  . Hypothyroidism   . Lupus Anticoagulant Positive   . On home oxygen therapy    at night  . Shortness of breath    exertion or lying flat  . Sleep apnea    2l of oxygen at night (as of 12/6, she used to)    Allergies  Allergen Reactions  . Lisinopril Swelling    THROAT SWELLING Pt states her throat started to "close up" after being on it for "so long."  . Corticosteroids Rash    States she developed a  rash on her tongue after a steroid injection in her bac    ROS General: Denies fever, chills, night sweats, changes in weight, changes in appetite HEENT: Denies headaches, ear pain, changes in vision, rhinorrhea, sore throat CV: Denies CP, palpitations, SOB, orthopnea Pulm: Denies SOB, cough, wheezing GI: Denies abdominal pain, nausea, vomiting, diarrhea, constipation GU: Denies hematuria, frequency, vaginal discharge + dysuria Msk: Denies muscle cramps, joint pains + right-sided low back pain Neuro: Denies weakness, numbness, tingling Skin: Denies rashes, bruising Psych: Denies depression, anxiety, hallucinations     Objective:    Blood pressure 126/90, pulse 75, temperature 97.6 F (36.4 C), temperature source Oral, weight 287 lb 12.8 oz (130.5 kg), SpO2 95 %.  Gen. Pleasant, well-nourished, obese, in no distress, normal affect   HEENT: [place]/AT, face symmetric, conjunctiva clear, no scleral icterus, PERRLA, EOMI, nares patent without drainage Lungs: no accessory muscle use, CTAB, no wheezes or rales Cardiovascular: RRR, no m/r/g, no peripheral edema Musculoskeletal: TTP of right lumbar paraspinal muscles.  CVA tenderness on right.  No TTP of cervical, thoracic, or lumbar, or sacral spine or left paraspinal muscles.  No deformities, no cyanosis or clubbing, normal tone Neuro:  A&Ox3, CN II-XII intact, ambulating with  cane Skin:  Warm, no lesions/ rash   Wt Readings from Last 3 Encounters:  08/11/20 287 lb 12.8 oz (130.5 kg)  06/11/20 290 lb 3.2 oz (131.6 kg)  06/07/20 290 lb 12.8 oz (131.9 kg)    Lab Results  Component Value Date   WBC 7.3 06/07/2020   HGB 11.9 (L) 06/07/2020   HCT 36.4 06/07/2020   PLT 285.0 06/07/2020   GLUCOSE 183 (H) 06/18/2020   CHOL 194 02/02/2020   TRIG 196 (H) 02/02/2020   HDL 76 02/02/2020   LDLCALC 89 02/02/2020   ALT 21 02/02/2020   AST 18 02/02/2020   NA 140 06/18/2020   K 3.9 06/18/2020   CL 99 06/18/2020   CREATININE 0.82 06/18/2020    BUN 16 06/18/2020   CO2 23 06/18/2020   TSH 4.23 06/07/2020   INR 1.9 (H) 07/12/2020   HGBA1C 8.7 (H) 06/07/2020    Assessment/Plan:  Acute right-sided low back pain without sciatica -Discussed possible causes including muscle strain 2/2 increased activity -Continue supportive care ice, heat, topical analgesics -Start tizanidine as needed.  Advised may cause drowsiness. -Follow-up for continued or worsening symptoms - Plan: POCT urinalysis dipstick, Zanaflex  Dysuria  -UA with glucose and protein - Plan: POCT urinalysis dipstick  Constipation, unspecified constipation type  -Discussed lifestyle modifications - Plan: LINZESS 145 MCG CAPS capsule  Diabetes mellitus type 2 in obese (HCC)  -Hemoglobin A1c 8.7% on 06/08/2018 -fsbs 157 this visit -Discussed importance of lifestyle occasions -Continue current medications -Obtain foot exam at next [facility] - Plan: Microalbumin/Creatinine Ratio, Urine, POC glucose  F/u prn  [name], MD

## 2020-08-11 NOTE — Patient Instructions (Signed)
Acute Back Pain, Adult Acute back pain is sudden and usually short-lived. It is often caused by an injury to the muscles and tissues in the back. The injury may result from:  A muscle or ligament getting overstretched or torn (strained). Ligaments are tissues that connect bones to each other. Lifting something improperly can cause a back strain.  Wear and tear (degeneration) of the spinal disks. Spinal disks are circular tissue that provide cushioning between the bones of the spine (vertebrae).  Twisting motions, such as while playing sports or doing yard work.  A hit to the back.  Arthritis. You may have a physical exam, lab tests, and imaging tests to find the cause of your pain. Acute back pain usually goes away with rest and home care. Follow these instructions at home: Managing pain, stiffness, and swelling  Treatment may include medicines for pain and inflammation that are taken by mouth or applied to the skin, prescription pain medicine, or muscle relaxants. Take over-the-counter and prescription medicines only as told by your health care provider.  Your health care provider may recommend applying ice during the first 24-48 hours after your pain starts. To do this: ? Put ice in a plastic bag. ? Place a towel between your skin and the bag. ? Leave the ice on for 20 minutes, 2-3 times a day.  If directed, apply heat to the affected area as often as told by your health care provider. Use the heat source that your health care provider recommends, such as a moist heat pack or a heating pad. ? Place a towel between your skin and the heat source. ? Leave the heat on for 20-30 minutes. ? Remove the heat if your skin turns bright red. This is especially important if you are unable to feel pain, heat, or cold. You have a greater risk of getting burned. Activity  Do not stay in bed. Staying in bed for more than 1-2 days can delay your recovery.  Sit up and stand up straight. Avoid leaning  forward when you sit or hunching over when you stand. ? If you work at a desk, sit close to it so you do not need to lean over. Keep your chin tucked in. Keep your neck drawn back, and keep your elbows bent at a 90-degree angle (right angle). ? Sit high and close to the steering wheel when you drive. Add lower back (lumbar) support to your car seat, if needed.  Take short walks on even surfaces as soon as you are able. Try to increase the length of time you walk each day.  Do not sit, drive, or stand in one place for more than 30 minutes at a time. Sitting or standing for long periods of time can put stress on your back.  Do not drive or use heavy machinery while taking prescription pain medicine.  Use proper lifting techniques. When you bend and lift, use positions that put less stress on your back: ? Bend your knees. ? Keep the load close to your body. ? Avoid twisting.  Exercise regularly as told by your health care provider. Exercising helps your back heal faster and helps prevent back injuries by keeping muscles strong and flexible.  Work with a physical therapist to make a safe exercise program, as recommended by your health care provider. Do any exercises as told by your physical therapist.   Lifestyle  Maintain a healthy weight. Extra weight puts stress on your back and makes it difficult to have   good posture.  Avoid activities or situations that make you feel anxious or stressed. Stress and anxiety increase muscle tension and can make back pain worse. Learn ways to manage anxiety and stress, such as through exercise. General instructions  Sleep on a firm mattress in a comfortable position. Try lying on your side with your knees slightly bent. If you lie on your back, put a pillow under your knees.  Follow your treatment plan as told by your health care provider. This may include: ? Cognitive or behavioral therapy. ? Acupuncture or massage therapy. ? Meditation or yoga. Contact  a health care provider if:  You have pain that is not relieved with rest or medicine.  You have increasing pain going down into your legs or buttocks.  Your pain does not improve after 2 weeks.  You have pain at night.  You lose weight without trying.  You have a fever or chills. Get help right away if:  You develop new bowel or bladder control problems.  You have unusual weakness or numbness in your arms or legs.  You develop nausea or vomiting.  You develop abdominal pain.  You feel faint. Summary  Acute back pain is sudden and usually short-lived.  Use proper lifting techniques. When you bend and lift, use positions that put less stress on your back.  Take over-the-counter and prescription medicines and apply heat or ice as directed by your health care provider. This information is not intended to replace advice given to you by your health care provider. Make sure you discuss any questions you have with your health care provider. Document Revised: 11/28/2019 Document Reviewed: 11/28/2019 Elsevier Patient Education  2021 [employer].  Dysuria Dysuria is pain or discomfort during urination. The pain or discomfort may be felt in the part of the body that drains urine from the bladder (urethra) or in the surrounding tissue of the genitals. The pain may also be felt in the groin area, lower abdomen, or lower back. You may have to urinate frequently or have the sudden feeling that you have to urinate (urgency). Dysuria can affect anyone, but it is more common in females. Dysuria can be caused by many different things, including:  Urinary tract infection.  Kidney stones or bladder stones.  Certain STIs (sexually transmitted infections), such as chlamydia.  Dehydration.  Inflammation of the tissues of the vagina.  Use of certain medicines.  Use of certain soaps or scented products that cause irritation. Follow these instructions at home: Medicines  Take  over-the-counter and prescription medicines only as told by your health care provider.  If you were prescribed an antibiotic medicine, take it as told by your health care provider. Do not stop taking the antibiotic even if you start to feel better. Eating and drinking  Drink enough fluid to keep your urine pale yellow.  Avoid caffeinated beverages, tea, and alcohol. These beverages can irritate the bladder and make dysuria worse. In males, alcohol may irritate the prostate.   General instructions  Watch your condition for any changes.  Urinate often. Avoid holding urine for long periods of time.  If you are female, you should wipe from front to back after urinating or having a bowel movement. Use each piece of toilet paper only once.  Empty your bladder after sex.  Keep all follow-up visits. This is important.  If you had any tests done to find the cause of dysuria, it is up to you to get your test results. Ask your health  care provider, or the department that is doing the test, when your results will be ready. Contact a health care provider if:  You have a fever.  You develop pain in your back or sides.  You have nausea or vomiting.  You have blood in your urine.  You are not urinating as often as you usually do. Get help right away if:  Your pain is severe and not relieved with medicines.  You cannot eat or drink without vomiting.  You are confused.  You have a rapid heartbeat while resting.  You have shaking or chills.  You feel extremely weak. Summary  Dysuria is pain or discomfort while urinating. Many different conditions can lead to dysuria.  If you have dysuria, you may have to urinate frequently or have the sudden feeling that you have to urinate (urgency).  Watch your condition for any changes. Keep all follow-up visits.  Make sure that you urinate often and drink enough fluid to keep your urine pale yellow. This information is not intended to replace  advice given to you by your health care provider. Make sure you discuss any questions you have with your health care provider. Document Revised: 10/17/2019 Document Reviewed: 10/17/2019 Elsevier Patient Education  [employer].  Preventing Diabetes Mellitus Complications You can help to prevent or slow down problems that are caused by diabetes (diabetes mellitus). Following your diabetes plan and taking care of yourself can reduce your risk of serious or life-threatening complications. What actions can I take to prevent diabetes complications? Diabetes management  Follow instructions from your health care providers about managing your diabetes. Your diabetes may be managed by a team of health care providers who can teach you how to care for yourself and can answer questions that you have.  Educate yourself about your condition so you can make healthy choices about eating and physical activity.  Know your target range for your blood sugar (glucose), and check your blood glucose level as often as told. Your health care provider will help you decide how often to check your blood glucose level depending on your treatment goals and how well you are meeting them.  Ask your health care provider if you should take low-dose aspirin daily and what dose is recommended for you. Taking low-dose aspirin daily is recommended to help prevent cardiovascular disease.   Controlling your blood pressure and cholesterol Your personal target blood pressure is determined based on:  Your age.  Your medicines.  How long you have had diabetes.  Any other medical conditions you have. To control your blood pressure:  Follow instructions from your health care provider about meal planning, exercise, and medicines.  Make sure your health care provider checks your blood pressure at every medical visit.  Monitor your blood pressure at home as told by your health care provider. To control your  cholesterol:  Follow instructions from your health care provider about meal planning, exercise, and medicines.  Have your cholesterol checked at least once a year.  You may be prescribed medicine to lower cholesterol (statin). If you are not taking a statin, ask your health care provider if you should be. Controlling your cholesterol may:  Help prevent heart disease and stroke. These are the most common health problems for people with diabetes.  Improve your blood flow.   Medical appointments and vaccines Schedule and keep yearly physical exams and eye exams. Your health care provider will tell you how often you need medical visits depending on your diabetes  management plan. Keep all follow-up visits as told. This is important so possible problems can be identified early and complications can be avoided or treated.  Every visit with your health care provider should include measuring your: ? Weight. ? Blood pressure. ? Blood glucose control.  Your A1C (hemoglobin A1C) level should be checked: ? At least 2 times a year, if you are meeting your treatment goals. ? 4 times a year, if you are not meeting treatment goals or if your treatment goals have changed.  Your blood lipids (lipid profile) should be checked yearly. You should also be checked yearly for protein in your urine (urine microalbumin).  If you have type 1 diabetes, get an eye exam 3-5 years after you are diagnosed, and then once a year after your first exam.  If you have type 2 diabetes, get an eye exam as soon as you are diagnosed, and then once a year after your first exam. It is also important to keep your vaccines current. It is recommended that you receive:  A flu (influenza) vaccine every year.  A pneumonia (pneumococcal) vaccine and a hepatitis B vaccine. If you are age 71 or older, you may get the pneumonia vaccine as a series of two separate shots. Ask your health care provider which other vaccines may be  recommended. Lifestyle  Do not use any products that contain nicotine or tobacco, such as cigarettes, e-cigarettes, and chewing tobacco. If you need help quitting, ask your health care provider. By avoiding nicotine and tobacco: ? You will lower your risk for heart attack, stroke, nerve disease, and kidney disease. ? Your cholesterol and blood pressure may improve. ? Your blood circulation will improve.  If you drink alcohol: ? Limit how much you use to:  0-1 drink a day for women who are not pregnant.  0-2 drinks a day for men. ? Be aware of how much alcohol is in your drink. In the [place] one drink equals one 12 oz bottle of beer (355 mL), one 5 oz glass of wine (148 mL), or one 11?2 oz glass of hard liquor (44 mL). Taking care of your feet Diabetes may cause you to have poor blood circulation to your legs and feet. Because of this, taking care of your feet is very important. Diabetes can cause:  The skin on the feet to get thinner, break more easily, and heal more slowly.  Nerve damage in your legs and feet, which results in decreased feeling. You may not notice minor injuries that could lead to serious problems. To avoid foot problems:  Check your skin and feet every day for cuts, bruises, redness, blisters, or sores.  Schedule a foot exam with your health care provider once every year. This exam includes: ? Inspecting the structure and skin of your feet. ? Checking the pulses and sensation in your feet.  Make sure that your health care provider performs a visual foot exam at every medical visit.   Taking care of your teeth People with poorly controlled diabetes are more likely to have gum (periodontal) disease. Diabetes can make periodontal diseases harder to control. If not treated, periodontal diseases can lead to tooth loss. To prevent this:  Brush your teeth twice a day.  Floss at least once a day.  Visit your dentist 2 times a year. Managing stress Living with diabetes  can be stressful. When you are experiencing stress, your blood glucose may be affected in two ways:  Stress hormones may cause your blood  glucose to rise.  You may be distracted from taking good care of yourself. Be aware of your stress level and make changes to help you manage challenging situations. To lower your stress levels:  Consider joining a support group.  Do planned relaxation or meditation.  Do a hobby that you enjoy.  Maintain healthy relationships.  Exercise regularly.  Work with your health care provider or a mental health professional. Where to find more information  American Diabetes Association: www.diabetes.org  Association of Diabetes Care and Education Specialists: www.diabeteseducator.org Summary  You can take action to prevent or slow down problems that are caused by diabetes (diabetes mellitus). Following your diabetes plan and taking care of yourself can reduce your risk of serious or life-threatening complications.  Follow instructions from your health care providers about managing your diabetes. Your diabetes may be managed by a team of health care providers who can teach you how to care for yourself and can answer questions that you have.  Know your target range for your blood sugar (glucose), and check your blood glucose levels as often as told. Your health care provider will help you decide how often you should check your blood glucose level depending on your treatment goals and how well you are meeting them.  Your health care provider will tell you how often you need medical visits depending on your diabetes management plan. Keep all follow-up visits as directed. This is important so possible problems can be identified early and complications can be avoided or treated. This information is not intended to replace advice given to you by your health care provider. Make sure you discuss any questions you have with your health care provider. Document Revised:  04/25/2019 Document Reviewed: 04/25/2019 Elsevier Patient Education  2021 [employer].

## 2020-08-13 NOTE — Telephone Encounter (Signed)
Ok

## 2020-08-18 ENCOUNTER — Emergency Department (HOSPITAL_COMMUNITY)
Admission: EM | Admit: 2020-08-18 | Discharge: 2020-08-18 | Disposition: A | Discharge status: Home / Self Care | Payer: Medicare Other | Attending: Emergency Medicine | Admitting: Emergency Medicine

## 2020-08-18 ENCOUNTER — Emergency Department (HOSPITAL_COMMUNITY): Payer: Medicare Other

## 2020-08-18 ENCOUNTER — Telehealth (INDEPENDENT_AMBULATORY_CARE_PROVIDER_SITE_OTHER): Payer: Medicare Other | Admitting: Family Medicine

## 2020-08-18 ENCOUNTER — Other Ambulatory Visit: Payer: Self-pay

## 2020-08-18 ENCOUNTER — Encounter: Payer: Self-pay | Admitting: Family Medicine

## 2020-08-18 DIAGNOSIS — Z7901 Long term (current) use of anticoagulants: Secondary | ICD-10-CM | POA: Insufficient documentation

## 2020-08-18 DIAGNOSIS — G8929 Other chronic pain: Secondary | ICD-10-CM

## 2020-08-18 DIAGNOSIS — Z96653 Presence of artificial knee joint, bilateral: Secondary | ICD-10-CM | POA: Diagnosis not present

## 2020-08-18 DIAGNOSIS — J441 Chronic obstructive pulmonary disease with (acute) exacerbation: Secondary | ICD-10-CM | POA: Insufficient documentation

## 2020-08-18 DIAGNOSIS — E039 Hypothyroidism, unspecified: Secondary | ICD-10-CM | POA: Diagnosis not present

## 2020-08-18 DIAGNOSIS — M5441 Lumbago with sciatica, right side: Secondary | ICD-10-CM | POA: Diagnosis not present

## 2020-08-18 DIAGNOSIS — I5032 Chronic diastolic (congestive) heart failure: Secondary | ICD-10-CM | POA: Diagnosis not present

## 2020-08-18 DIAGNOSIS — E119 Type 2 diabetes mellitus without complications: Secondary | ICD-10-CM | POA: Diagnosis not present

## 2020-08-18 DIAGNOSIS — M549 Dorsalgia, unspecified: Secondary | ICD-10-CM

## 2020-08-18 DIAGNOSIS — M545 Low back pain, unspecified: Secondary | ICD-10-CM | POA: Diagnosis not present

## 2020-08-18 DIAGNOSIS — M5440 Lumbago with sciatica, unspecified side: Secondary | ICD-10-CM

## 2020-08-18 DIAGNOSIS — Z7984 Long term (current) use of oral hypoglycemic drugs: Secondary | ICD-10-CM | POA: Diagnosis not present

## 2020-08-18 DIAGNOSIS — I110 Hypertensive heart disease with heart failure: Secondary | ICD-10-CM | POA: Insufficient documentation

## 2020-08-18 DIAGNOSIS — Z79899 Other long term (current) drug therapy: Secondary | ICD-10-CM | POA: Diagnosis not present

## 2020-08-18 DIAGNOSIS — Z8616 Personal history of COVID-19: Secondary | ICD-10-CM | POA: Diagnosis not present

## 2020-08-18 DIAGNOSIS — J45909 Unspecified asthma, uncomplicated: Secondary | ICD-10-CM | POA: Diagnosis not present

## 2020-08-18 DIAGNOSIS — Z743 Need for continuous supervision: Secondary | ICD-10-CM | POA: Diagnosis not present

## 2020-08-18 LAB — CBC WITH DIFFERENTIAL/PLATELET
Abs Immature Granulocytes: 0.01 10*3/uL (ref 0.00–0.07)
Basophils Absolute: 0 10*3/uL (ref 0.0–0.1)
Basophils Relative: 1 %
Eosinophils Absolute: 0.1 10*3/uL (ref 0.0–0.5)
Eosinophils Relative: 2 %
HCT: 41.6 % (ref 36.0–46.0)
Hemoglobin: 12.9 g/dL (ref 12.0–15.0)
Immature Granulocytes: 0 %
Lymphocytes Relative: 34 %
Lymphs Abs: 2.1 10*3/uL (ref 0.7–4.0)
MCH: 26.6 pg (ref 26.0–34.0)
MCHC: 31 g/dL (ref 30.0–36.0)
MCV: 85.8 fL (ref 80.0–100.0)
Monocytes Absolute: 0.6 10*3/uL (ref 0.1–1.0)
Monocytes Relative: 10 %
Neutro Abs: 3.3 10*3/uL (ref 1.7–7.7)
Neutrophils Relative %: 53 %
Platelets: 343 10*3/uL (ref 150–400)
RBC: 4.85 MIL/uL (ref 3.87–5.11)
RDW: 18.2 % — ABNORMAL HIGH (ref 11.5–15.5)
WBC: 6.1 10*3/uL (ref 4.0–10.5)
nRBC: 0 % (ref 0.0–0.2)

## 2020-08-18 LAB — COMPREHENSIVE METABOLIC PANEL
ALT: 22 U/L (ref 0–44)
AST: 24 U/L (ref 15–41)
Albumin: 3.6 g/dL (ref 3.5–5.0)
Alkaline Phosphatase: 80 U/L (ref 38–126)
Anion gap: 10 (ref 5–15)
BUN: 13 mg/dL (ref 8–23)
CO2: 24 mmol/L (ref 22–32)
Calcium: 8.8 mg/dL — ABNORMAL LOW (ref 8.9–10.3)
Chloride: 105 mmol/L (ref 98–111)
Creatinine, Ser: 0.7 mg/dL (ref 0.44–1.00)
GFR, Estimated: >60 mL/min (ref >60)
Glucose, Bld: 123 mg/dL — ABNORMAL HIGH (ref 70–99)
Potassium: 4 mmol/L (ref 3.5–5.1)
Sodium: 139 mmol/L (ref 135–145)
Total Bilirubin: 0.4 mg/dL (ref 0.3–1.2)
Total Protein: 7.7 g/dL (ref 6.5–8.1)

## 2020-08-18 LAB — URINALYSIS, ROUTINE W REFLEX MICROSCOPIC
Bacteria, UA: NONE SEEN
Bilirubin Urine: NEGATIVE
Glucose, UA: >=500 mg/dL — AB
Hgb urine dipstick: NEGATIVE
Ketones, ur: 5 mg/dL — AB
Leukocytes,Ua: NEGATIVE
Nitrite: NEGATIVE
Protein, ur: 100 mg/dL — AB
Specific Gravity, Urine: 1.022 (ref 1.005–1.030)
pH: 5 (ref 5.0–8.0)

## 2020-08-18 MED ORDER — OXYCODONE HCL 5 MG PO TABS
5.0000 mg | ORAL_TABLET | Freq: Once | ORAL | Status: AC
  Administered 2020-08-18: 5 mg via ORAL
  Filled 2020-08-18: qty 1

## 2020-08-18 MED ORDER — DIAZEPAM 2 MG PO TABS
2.0000 mg | ORAL_TABLET | Freq: Once | ORAL | Status: AC
  Administered 2020-08-18: 2 mg via ORAL
  Filled 2020-08-18: qty 1

## 2020-08-18 NOTE — ED Provider Notes (Signed)
Emergency Medicine Provider Triage Evaluation Note  Tykira Wachs Ensminger , a 65 y.o. female  was evaluated in triage.  Pt complains of back pain.  Review of Systems  Positive: Back pain Negative: Dysuria, bowel/bladder incontinence, trauma  Physical Exam  BP (!) 161/107 (BP Location: Right Arm)   Pulse 72   Temp 97.9 F (36.6 C)   Resp 20   SpO2 93%  Gen:   Awake, no distress   Resp:  Normal effort  MSK:   Moves extremities without difficulty  Other:  TTP R lumbar paraspinal  Medical Decision Making  Medically screening exam initiated at 2:51 PM.  Appropriate orders placed.  Rosalva Neary Streiff was informed that the remainder of the evaluation will be completed by another provider, this initial triage assessment does not replace that evaluation, and the importance of remaining in the ED until their evaluation is complete.  Progressive worsening R lower back pain, unrelieved with current medication.  No red flags.    Domenic Moras, PA-C 08/18/20 [id number]    Lucrezia Starch, MD 08/19/20 [phone]

## 2020-08-18 NOTE — ED Notes (Signed)
Patient transported to X-ray 

## 2020-08-18 NOTE — Discharge Instructions (Addendum)
Your laboratories also are within normal limits today.  I provided a copy of the x-ray of your back.  You will need to follow-up with Dr. Christella Noa in order to obtain further management of your recurrent back pain.  If you experience any bladder or bowel incontinence you may return to the emergency department.

## 2020-08-18 NOTE — ED Provider Notes (Signed)
[place] EMERGENCY DEPARTMENT Provider Note   CSN: [id number] Arrival date & time: 08/18/20  1442     History Chief Complaint  Patient presents with  . Back Pain    [name] is a 65 y.o. female.  65 y.o female with a PMH of Adrenal mass, COPD, Clotting disorder, Fibromyalgia, presents to the ED with a chief complaint of low back pain x 3 weeks. Patient was evaluated by her PCP approximately 3 weeks ago, she prescribed him Zanaflex, NSAIDs, medication to help with her symptoms without much improvement.  This is described as a lumbar spine pain specified to the right side without any radiation.  She does have a prior surgical intervention of her lumbar spine by Dr. Christella Noa.  Reports this has been worsening.  She also noted to have some ketones in her urine during her last PCP visit but denies any dysuria or frequency on todays visit. No prior hx of IV drug use, no leg weakness, no bowel or bladder complaint.   The history is provided by the patient.  Back Pain Location:  Lumbar spine Quality:  Stabbing Radiates to:  Does not radiate Pain severity:  Moderate Pain is:  Same all the time Onset quality:  Gradual Duration:  3 weeks Timing:  Constant Progression:  Worsening Chronicity:  Recurrent Relieved by:  Nothing Worsened by:  Ambulation and movement Ineffective treatments:  Heating pad, ibuprofen, NSAIDs and muscle relaxants Associated symptoms: no abdominal pain, no chest pain, no fever and no headaches        Past Medical History:  Diagnosis Date  . Adrenal mass ([place]) 03/226   Benign  . Arthritis    knees/multiple orthopedic conditons; lower back  . Asthma    per pt  . 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 Depression   . Diabetes mellitus    120s usually fasting -  dx more than 10 yrs ago  . Dizziness, nonspecific   .  DVT (deep venous thrombosis) (HCC)    Recurrent  . Fibromyalgia   . GERD (gastroesophageal reflux disease)   . Heart murmur   . History of blood transfusion   . History of cocaine abuse ([place])    Remote history   . Hypertension    takes meds daily  . Hypothyroidism   . Lupus Anticoagulant Positive   . On home oxygen therapy    at night  . Shortness of breath    exertion or lying flat  . Sleep apnea    2l of oxygen at night (as of 12/6, she used to)    Patient Active Problem List   Diagnosis Date Noted  . Chronic back pain 08/18/2020  . Chronic heart failure with preserved ejection fraction ([place]) 02/27/2020  . Obesity, diabetes, and hypertension syndrome ([place]) 02/27/2020  . 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 Pseudophakia of both eyes 02/09/2020  . Chronic diastolic CHF (congestive heart failure) ([place]) 03/12/2019  . Acute respiratory failure with hypoxia ([place]) 03/09/2019  . Hypernatremia 03/09/2019  . Suspected COVID-19 virus infection 03/09/2019  . Acute encephalopathy 03/09/2019  . Right sided weakness 02/20/2019  . Thyroid mass 02/27/2017  . Chronic pain of right knee 05/24/2016  . Effusion, right knee 04/28/2016  . Presence of right artificial knee joint 04/28/2016  . Abnormal LFTs   . Decreased sensation   . Paresthesia 06/12/2014  .  Obstruction of kidney 04/24/2014  . Diabetes type 2, uncontrolled ([place]) 04/24/2014  . Chronic anticoagulation 04/24/2014  . Crack cocaine use 04/24/2014  . Depression 04/24/2014  . Fibromyalgia 04/24/2014  . COPD (chronic obstructive pulmonary disease) ([place]) 04/24/2014  . OSA (obstructive sleep apnea) 04/24/2014  . Lupus anticoagulant positive 04/24/2014  . Adrenal mass, right ([place]) 04/24/2014  . Right kidney mass 04/24/2014  . Hydronephrosis of right kidney 04/24/2014  . Supratherapeutic INR 04/24/2014  . Renal mass, right 04/24/2014  . Right lower quadrant abdominal pain   . Morbid obesity ([place]) 08/21/2013  .  Constipation 08/19/2013  . COPD with acute exacerbation ([place]) 08/17/2013  . Morbid obesity with BMI of 45.0-49.9, adult ([place]) 01/05/2012  . Bilateral deep vein thromboses ([place]) 12/13/2011  . Abdominal pain 11/05/2011  . Hypokalemia 11/05/2011  . Sciatica 11/05/2011  . Diabetes mellitus type 2 in obese ([place]) 11/05/2011  . HTN (hypertension) 11/05/2011    Past Surgical History:  Procedure Laterality Date  . ABDOMINAL HYSTERECTOMY  1989   Fibroids  . ANTERIOR LUMBAR FUSION  01/03/2012   Procedure: ANTERIOR LUMBAR FUSION 1 LEVEL;  Surgeon: Winfield Cunas, MD;  Location: [facility];  Service: Neurosurgery;  Laterality: N/A;  Lumbar Four-Five Anterior Lumbar Interbody Fusion with Instrumentation  . BACK SURGERY     cervical spine---disk disease  . CARPAL TUNNEL RELEASE     Bilateral  . CESAREAN SECTION    . CHOLECYSTECTOMY    . CYSTOSCOPY/RETROGRADE/URETEROSCOPY/STONE EXTRACTION WITH BASKET Right 04/26/2014   Procedure: CYSTOSCOPY/ RIGHT RETROGRADE/ RIGHT URETEROSCOPY/URETERAL AND RENAL PELVIS BIOPSY;  Surgeon: Alexis Frock, MD;  Location: [facility];  Service: Urology;  Laterality: Right;  . gallstones removed    . 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   bilateral  . right elbow surgery    . THYROID LOBECTOMY Right 02/27/2017  . THYROID LOBECTOMY Right 02/27/2017   Procedure: RIGHT THYROID LOBECTOMY;  Surgeon: Erroll Luna, MD;  Location: [facility];  Service: General;  Laterality: Right;  . TUBAL LIGATION       OB History   No obstetric history on file.     Family History  Problem Relation Age of Onset  . Hypertension Mother   . Diabetes Mother   . Cancer Mother        Pancreatic  . Hypertension Father   . Heart failure Father     Social History   Tobacco Use  . Smoking status: Never Smoker  . Smokeless tobacco: Never Used  Vaping Use  . Vaping Use: Never used  Substance Use Topics  . Alcohol use: No    Alcohol/week: 0.0 standard drinks    Comment: beer in past   .  Drug use: Yes    Types: Cocaine    Comment: quit 2003-rehab progam in 2005    Home Medications Prior to Admission medications   Medication Sig Start Date End Date Taking? Authorizing Provider  ACCU-CHEK GUIDE test strip USE UP TO FOUR TIMES DAILY AS DIRECTED. 08/12/19   Billie Ruddy, MD  Accu-Chek Softclix Lancets lancets USE UP TO FOUR TIMES DAILY AS DIRECTED. 08/12/19   Billie Ruddy, MD  albuterol (PROVENTIL HFA;VENTOLIN HFA) 108 (90 BASE) MCG/ACT inhaler Inhale 2 puffs into the lungs every 4 (four) hours as needed for shortness of breath.    [provider]  albuterol (PROVENTIL) (2.5 MG/3ML) 0.083% nebulizer solution USE 1 VIAL IN NEBULIZER EVERY 6 HOURS - and as needed 03/22/20   Billie Ruddy, MD  amLODipine (  NORVASC) 5 MG tablet Take 5 mg by mouth daily.    [provider]  ANORO ELLIPTA 62.5-25 MCG/INH AEPB Inhale 1 puff into the lungs daily.  05/22/18   [provider]  benzonatate (TESSALON) 100 MG capsule Take 100 mg by mouth 3 (three) times daily as needed. 03/20/19   [provider]  blood glucose meter kit and supplies KIT Dispense based on patient and insurance preference. Use up to four times daily as directed. (FOR ICD-9 250.00, 250.01). 07/16/19   Billie Ruddy, MD  budesonide-formoterol [facility]) 160-4.5 MCG/ACT inhaler 2 puffs    [provider]  canagliflozin (INVOKANA) 100 MG TABS tablet Take 1 tablet (100 mg total) by mouth daily before breakfast. 02/27/20   Chandrasekhar, [name] A, MD  CARTIA XT 240 MG 24 hr capsule Take 240 mg by mouth daily. 06/17/14   [provider]  cloNIDine (CATAPRES) 0.1 MG tablet Take 0.1 mg by mouth 2 (two) times daily.    [provider]  diclofenac sodium (VOLTAREN) 1 % GEL Apply 2 g topically daily as needed (fibromyalgia pain).    [provider]  DULoxetine (CYMBALTA) 20 MG capsule Take 20 mg by mouth daily. 01/11/18   [provider]  DULoxetine  (CYMBALTA) 60 MG capsule Take 60 mg by mouth at bedtime.     [provider]  fluticasone (FLONASE) 50 MCG/ACT nasal spray Place 1 spray into both nostrils daily.  09/04/18   [provider]  furosemide (LASIX) 40 MG tablet Take 1 tablet (40 mg total) by mouth daily. 06/11/20   Werner Lean, MD  gabapentin (NEURONTIN) 300 MG capsule Take 1 capsule (300 mg total) by mouth at bedtime. 06/07/20   Billie Ruddy, MD  levocetirizine (XYZAL) 5 MG tablet Take 5 mg by mouth at bedtime. 02/11/19   [provider]  LINZESS 145 MCG CAPS capsule Take 1 capsule (145 mcg total) by mouth every other day. 08/11/20   Billie Ruddy, MD  meclizine (ANTIVERT) 25 MG tablet Take 1 tablet (25 mg total) by mouth 3 (three) times daily as needed for dizziness. 09/29/16   Charlesetta Shanks, MD  metFORMIN (GLUCOPHAGE) 500 MG tablet Take 500 mg by mouth at bedtime.    [provider]  metoCLOPramide (REGLAN) 10 MG tablet Take 1 tablet (10 mg total) by mouth every 6 (six) hours as needed for nausea (or headache). 8/75/64   Delora Fuel, MD  metoprolol succinate (TOPROL-XL) 100 MG 24 hr tablet Take 1 tablet (100 mg total) by mouth as directed. Take with or immediately following a meal. 04/13/20   Chandrasekhar, [name] A, MD  omeprazole (PRILOSEC) 20 MG capsule Take 20 mg by mouth 2 (two) times daily.    [provider]  potassium chloride (MICRO-K) 10 MEQ CR capsule Take 2 capsules (20 mEq total) by mouth daily. 06/11/20   Billie Ruddy, MD  pravastatin (PRAVACHOL) 40 MG tablet Take 1 tablet (40 mg total) by mouth every evening. 02/06/20   Billie Ruddy, MD  QUEtiapine (SEROQUEL) 50 MG tablet Take 50 mg by mouth at bedtime.     [provider]  REXULTI 2 MG TABS tablet Take 2 mg by mouth daily. 01/28/19   [provider]  SYMBICORT 160-4.5 MCG/ACT inhaler SMARTSIG:2 Puff(s) By Mouth Twice Daily 07/24/19   [provider]  tiZANidine (ZANAFLEX) 4 MG  tablet Take 1 tablet (4 mg total) by mouth every 6 (six) hours as needed for muscle spasms.  08/11/20   Billie Ruddy, MD  Vitamin D, Ergocalciferol, (DRISDOL) 1.25 MG (50000 UNIT) CAPS capsule Take 1 capsule (50,000 Units total) by mouth every 7 (seven) days. 06/11/20   Billie Ruddy, MD  warfarin (COUMADIN) 10 MG tablet TAKE 1 TABLET BY MOUTH ON Laurine Blazer, FRIDAY 04/14/20   Ladell Pier, MD  warfarin (COUMADIN) 5 MG tablet TAKE 1 TABLET BY MOUTH DAILY EXCEPT ON MONDAY, WEDNESDAY, AND FRIDAY OR AS DIRECTED. 03/05/20   Ladell Pier, MD  zonisamide (ZONEGRAN) 100 MG capsule Take 1 capsule by mouth as directed. Take 1 tablet in the AM and 2 tablets in the PM 01/07/20   [provider]    Allergies    Lisinopril and Corticosteroids  Review of Systems   Review of Systems  Constitutional: Negative for chills and fever.  HENT: Negative for sinus pressure and sore throat.   Respiratory: Negative for shortness of breath.   Cardiovascular: Negative for chest pain.  Gastrointestinal: Negative for abdominal pain, nausea and vomiting.  Genitourinary: Negative for flank pain.  Musculoskeletal: Positive for back pain.  Skin: Negative for pallor.  Neurological: Negative for light-headedness and headaches.  All other systems reviewed and are negative.   Physical Exam Updated Vital Signs BP (!) 177/97   Pulse 70   Temp 97.9 F (36.6 C)   Resp 18   Ht _0  (1.676 m)   Wt 130.5 kg   SpO2 100%   BMI 46.44 kg/m   Physical Exam Vitals and nursing note reviewed.  Constitutional:      Appearance: Normal appearance.  HENT:     Head: Normocephalic and atraumatic.     Nose: Nose normal.     Mouth/Throat:     Mouth: Mucous membranes are moist.  Eyes:     Pupils: Pupils are equal, round, and reactive to light.  Cardiovascular:     Rate and Rhythm: Normal rate.  Pulmonary:     Effort: Pulmonary effort is normal.  Abdominal:     General: Abdomen is flat.      Tenderness: There is no abdominal tenderness. There is no right CVA tenderness or left CVA tenderness.  Musculoskeletal:        General: Tenderness present.     Cervical back: Normal range of motion and neck supple.     Lumbar back: Tenderness present. No swelling, deformity or lacerations. Normal range of motion. No scoliosis.     Comments: Pain with palpation along the right lumbar spine region. RLE- KF,KE 5/5 strength LLE- HF, HE 5/5 strength Normal gait. No pronator drift. No leg drop.  CN I, II and VIII not tested. CN II-XII grossly intact bilaterally.     Skin:    General: Skin is warm and dry.  Neurological:     Mental Status: She is alert and oriented to person, place, and time.     ED Results / Procedures / Treatments   Labs (all labs ordered are listed, but only abnormal results are displayed) Labs Reviewed  CBC WITH DIFFERENTIAL/PLATELET - Abnormal; Notable for the following components:      Result Value   RDW 18.2 (*)    All other components within normal limits  COMPREHENSIVE METABOLIC PANEL - Abnormal; Notable for the following components:   Glucose, Bld 123 (*)    Calcium 8.8 (*)    All other components within normal limits  URINALYSIS, ROUTINE W REFLEX MICROSCOPIC - Abnormal; Notable for the following components:   APPearance HAZY (*)  Glucose, UA >=500 (*)    Ketones, ur 5 (*)    Protein, ur 100 (*)    All other components within normal limits  URINE CULTURE    EKG None  Radiology DG Lumbar Spine Complete  Result Date: 08/18/2020 CLINICAL DATA:  Low back pain EXAM: LUMBAR SPINE - COMPLETE 4+ VIEW COMPARISON:  03/24/2013 FINDINGS: Prior anterior fusion at C4-5. Diffuse degenerative facet disease, most pronounced at L4-5 and L5-S1. Normal alignment. No fracture. SI joints symmetric and unremarkable. IMPRESSION: Postoperative and degenerative changes.  No acute bony abnormality. Electronically Signed   By: Rolm Baptise M.D.   On: 08/18/2020 17:30     Procedures Procedures   Medications Ordered in ED Medications  oxyCODONE (Oxy IR/ROXICODONE) immediate release tablet 5 mg (5 mg Oral Given 08/18/20 1934)  diazepam (VALIUM) tablet 2 mg (2 mg Oral Given 08/18/20 1934)    ED Course  I have reviewed the triage vital signs and the nursing notes.  Pertinent labs & imaging results that were available during my care of the patient were reviewed by me and considered in my medical decision making (see chart for details).    MDM Rules/Calculators/A&P  Patient with a PMH of DM, presents to the ED with a chief complaint of low back pain, evaluated by PCP 2 weeks ago, given muscle relaxers to help with pain without much improvement.  PCP sent patient here for further evaluation as there is some concern for some urinary tract infection versus kidney stone.  During my evaluation patient is overall well-appearing, nontoxic.  There is no CVA tenderness, abdomen is soft nontender to palpation.  Pain along the lumbar region specially along the right side without any radiation down her leg.  Does have a prior history of intervention to her lumbar spine by Dr. Christella Noa.  She has not seen them in several years as stated.  She is hemodynamically stable, afebrile with no prior history of IV drug use.  Nuys any urinary symptoms such as urinary retention, no bowel incontinence.  Interpretation of her labs reveal a CMP without any electro derangement, creatinine levels within normal limits.  LFTs are unremarkable.  CBC without any leukocytosis.  UA with some ketones, prior history of diabetes.  Blood cell count is 6-10, however specimen likely contaminated.  We will send this for urine culture.  X-ray of her lumbar spine showed Postoperative and degenerative changes. No acute bony abnormality.     This point I was provided for patient.  She was given oxycodone, which according to her PDMP she is tolerated in the past.  Also given Valium for muscle spasms.   Reports mild improvement in symptoms.  Patient stable for discharge, return precautions discussed at length  Portions of this note were generated with Dragon dictation software. Dictation errors may occur despite best attempts at proofreading.  Final Clinical Impression(s) / ED Diagnoses Final diagnoses:  Chronic right-sided low back pain with sciatica, sciatica laterality unspecified    Rx / DC Orders ED Discharge Orders    None       Janeece Fitting, PA-C 08/18/20 2003    Lorelle Gibbs, DO 08/18/20 2330

## 2020-08-18 NOTE — ED Triage Notes (Signed)
Pt with left lower back pain without injury x 3 weeks. Seen by PCP for same and prescribed muscle relaxer and is taking without relief.

## 2020-08-18 NOTE — Progress Notes (Signed)
  Virtual Visit via Telephone Note  I connected with [name] on 08/18/20 at  1:00 PM EDT by telephone and verified that I am speaking with the correct person using two identifiers.   I discussed the limitations, risks, security and privacy concerns of performing an evaluation and management service by telephone and the availability of in person appointments. I also discussed with the patient that there may be a patient responsible charge related to this service. The patient expressed understanding and agreed to proceed.  Location patient: home Location provider: work or home office Participants present for the call: patient, provider Patient did not have a visit in the prior 7 days to address this/these issue(s).   History of Present Illness: Pt is a 65 yo female with pmh sig for diastolic CHF, chronic back pain, HTN, h/o disc herniation, h/o recurrent DVT on anticoagulation, COPD, OSA, DM II, h/o R renal mass, h/o thyroid mass, obesity, seen 08/11/20 for back pain.  Pt states back has become worse, is a constant throb, "just hurts".  Endorses nausea on Saturday.Denies vomiting.  Pt tried zanaflex, tylenol without relief.  Pt denies injury.   Glucosuria noted on 5/25.  Pt has not been checking fsbs as she states her "fingers are white" like there is decreased blood flow.   Observations/Objective: Patient sounds cheerful and well on the phone. I do not appreciate any SOB. Speech and thought processing are grossly intact. Patient reported vitals:  Assessment and Plan: Other acute back pain  -Discussed concern for renal calculi, UTI/pyelonephritis, vertebral fracture, worsening of disc herniation, pancreatitis -MRI lumbar spine without contrast from 04/16/2019 reviewed.  Impression: Prior fusion L4-5 without residual or recurrent spinal stenosis.  Moderate to severe bilateral facet arthropathy at L3-4 through L5-S1, mildly progressed relative to 2018.  Disc bulging with facet  hypertrophy at L3-4 with resultant mild to moderate canal and right L3 foraminal stenosis. -Discussed obtaining labs and imaging outpatient versus ED.  Given level of pain pt wishes to proceed to ED for further evaluation. -Patient encouraged to resume follow-up with Ortho. - Plan: DG Lumbar Spine Complete, CMP, CBC with Differential/Platelet, POCT urinalysis dipstick, Lipase, Culture, Urine   Follow Up Instructions:  F/u with prn  [id number] 5-10 [id number] 11-20 9443 21-30 I did not refer this patient for an OV in the next 24 hours for this/these issue(s).  I discussed the assessment and treatment plan with the patient. The patient was provided an opportunity to ask questions and all were answered. The patient agreed with the plan and demonstrated an understanding of the instructions.   The patient was advised to call back or seek an in-person evaluation if the symptoms worsen or if the condition fails to improve as anticipated.  I provided 14:13 minutes of non-face-to-face time during this encounter.   Billie Ruddy, MD

## 2020-08-20 LAB — URINE CULTURE

## 2020-08-23 ENCOUNTER — Ambulatory Visit (INDEPENDENT_AMBULATORY_CARE_PROVIDER_SITE_OTHER): Payer: Medicare Other

## 2020-08-23 ENCOUNTER — Other Ambulatory Visit: Payer: Self-pay

## 2020-08-23 DIAGNOSIS — E538 Deficiency of other specified B group vitamins: Secondary | ICD-10-CM | POA: Diagnosis not present

## 2020-08-23 MED ORDER — CYANOCOBALAMIN 1000 MCG/ML IJ SOLN
1000.0000 ug | Freq: Once | INTRAMUSCULAR | Status: AC
  Administered 2020-08-23: 1000 ug via INTRAMUSCULAR

## 2020-08-23 NOTE — Progress Notes (Signed)
Per orders of Dr. Banks, injection of Cyanocobalamin 1000 mcg given by Kendra Grissett L Dreyden Rohrman. °Patient tolerated injection well.  °

## 2020-08-24 ENCOUNTER — Telehealth: Payer: Self-pay

## 2020-08-24 DIAGNOSIS — I10 Essential (primary) hypertension: Secondary | ICD-10-CM | POA: Diagnosis not present

## 2020-08-24 DIAGNOSIS — Z006 Encounter for examination for normal comparison and control in clinical research program: Secondary | ICD-10-CM

## 2020-08-24 DIAGNOSIS — M4726 Other spondylosis with radiculopathy, lumbar region: Secondary | ICD-10-CM | POA: Diagnosis not present

## 2020-08-24 DIAGNOSIS — M48062 Spinal stenosis, lumbar region with neurogenic claudication: Secondary | ICD-10-CM | POA: Diagnosis not present

## 2020-08-24 NOTE — Telephone Encounter (Signed)
I have attempted without success to contact this patient by phone for her Identify 90 day follow up phone call. I left a message for patient to return my phone call with my name and callback number. An e-mail was also sent to patient.  

## 2020-08-25 IMAGING — CT CT HEAD W/O CM
3 series · 16 of 47 positions shown, 19 images · non-contrast
Comparison: 02/16/2017 CT head.

CLINICAL DATA: 62 y/o F; episode of headache with 4 episodes of
vomiting.

EXAM:
CT HEAD WITHOUT CONTRAST
TECHNIQUE: Contiguous axial images were obtained from the base of the skull
through the vertex without intravenous contrast.

[Series 2: head wo · axial · 0.40mm/px · z∈[-157,-32]mm · 10 of 30 slices shown, 13 images]
[im 3/30  brain]
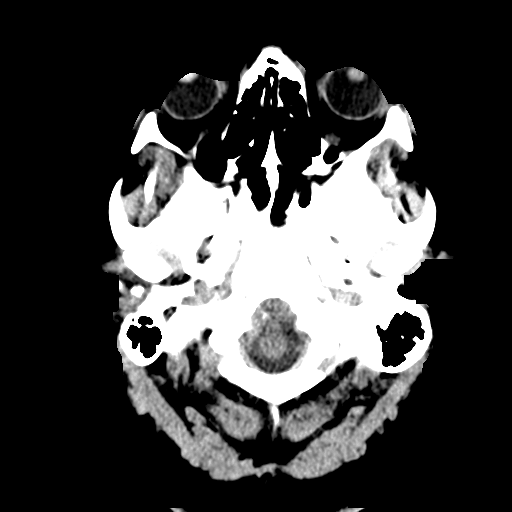
[im 3/30  bone]
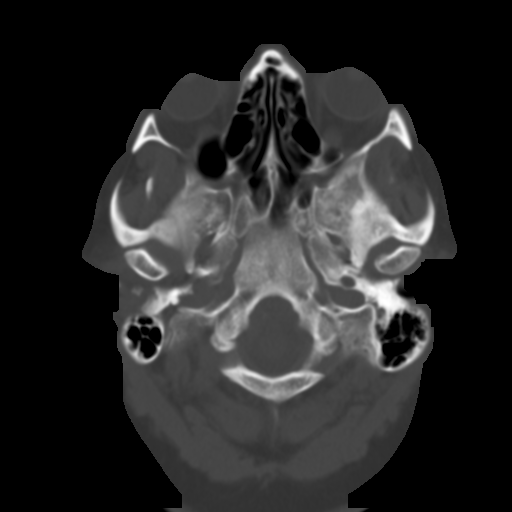
[im 6/30  brain]
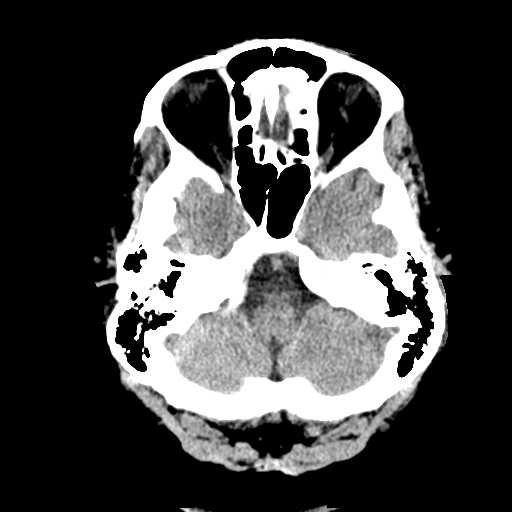
[im 9/30  brain]
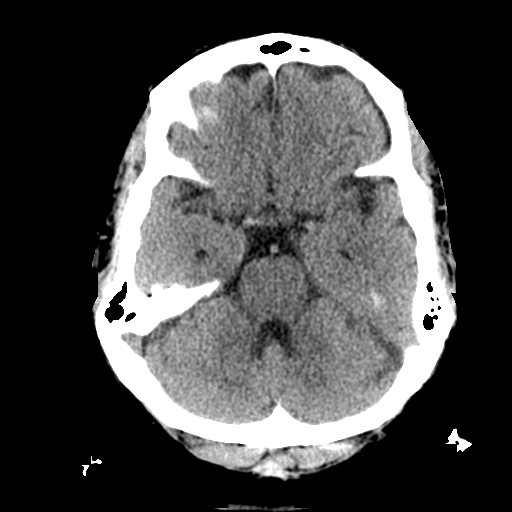
[im 11/30  brain]
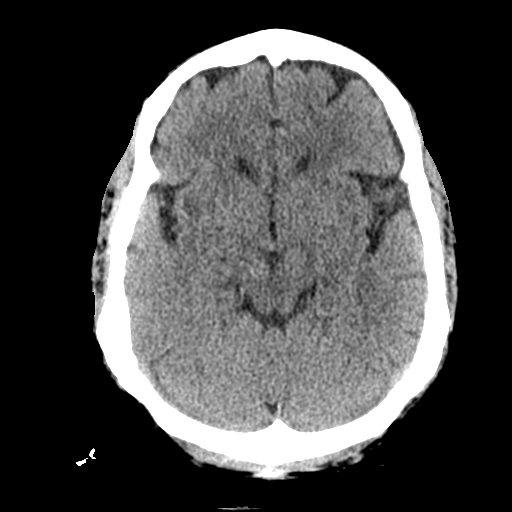
[im 14/30  brain]
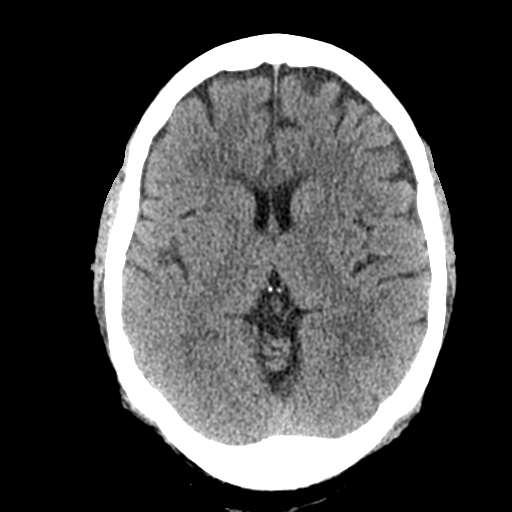
[im 14/30  bone]
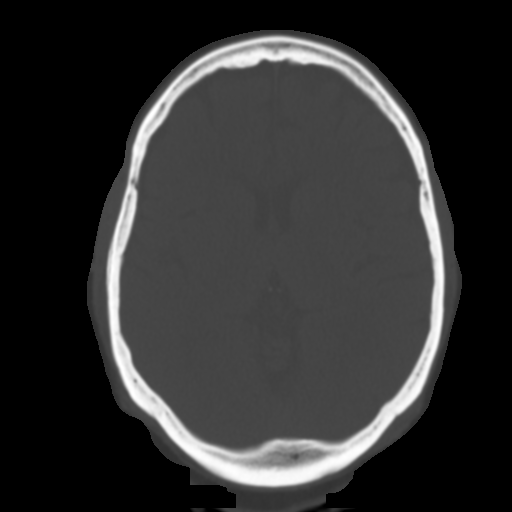
[im 17/30  brain]
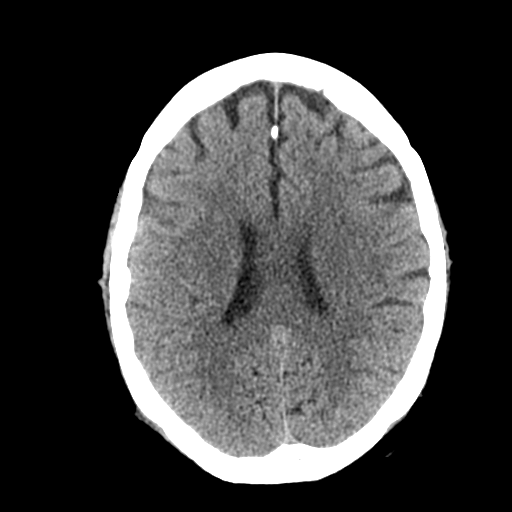
[im 20/30  brain]
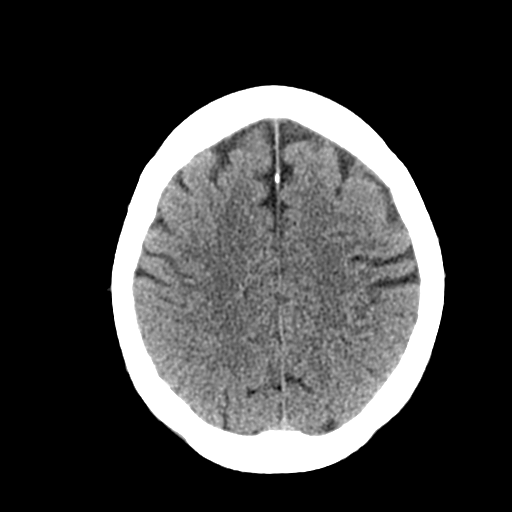
[im 23/30  brain]
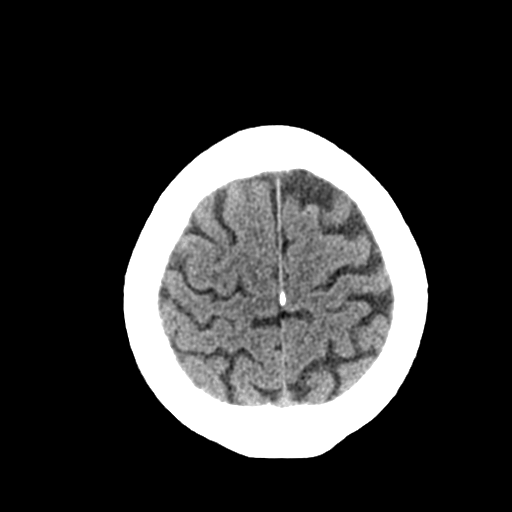
[im 25/30  brain]
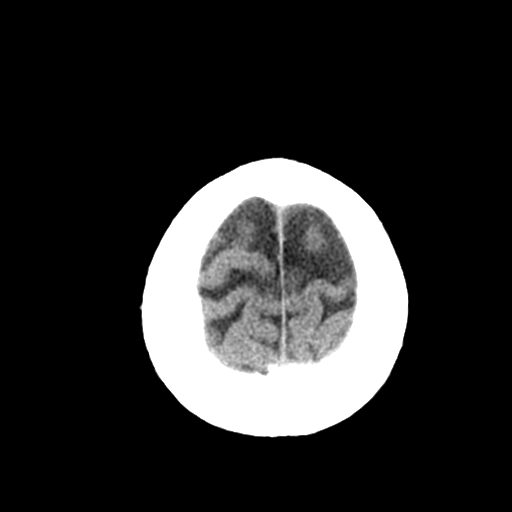
[im 25/30  bone]
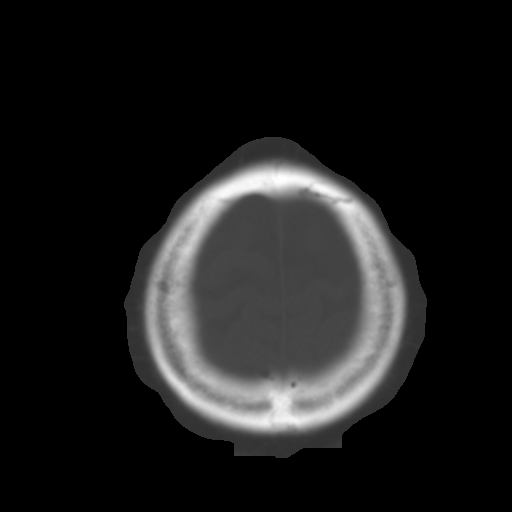
[im 28/30  brain]
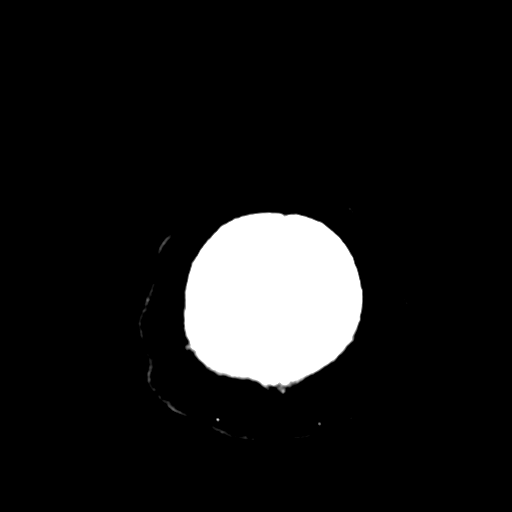

[Series 5: coronal soft tissue · coronal · 0.30mm/px · 3 of 65 slices shown]
[im 22/65  brain]
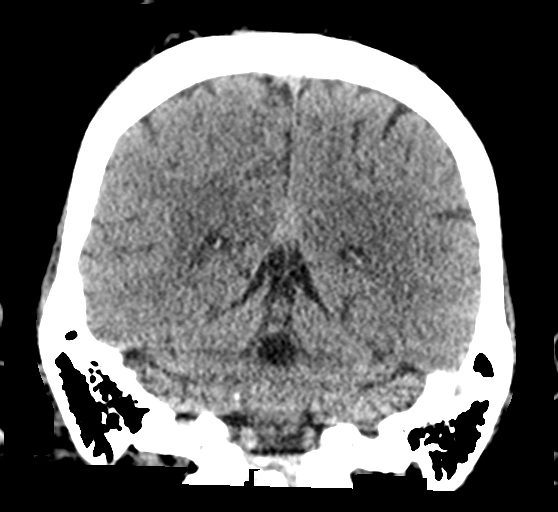
[im 29/65  brain]
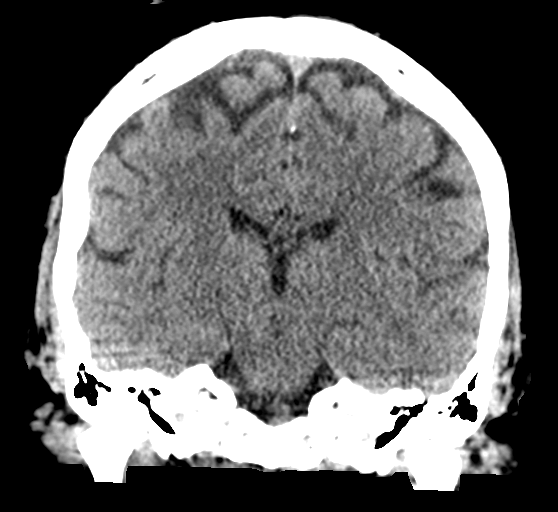
[im 36/65  brain]
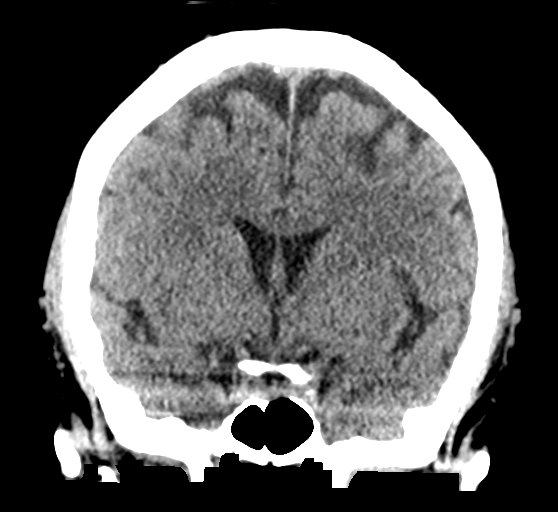

[Series 6: sagittal soft tissue · sagittal · 0.30mm/px · 3 of 56 slices shown]
[im 19/56  brain]
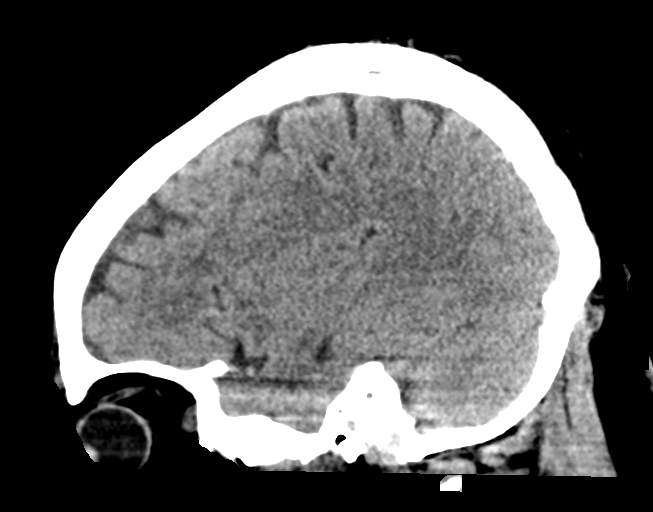
[im 28/56  brain]
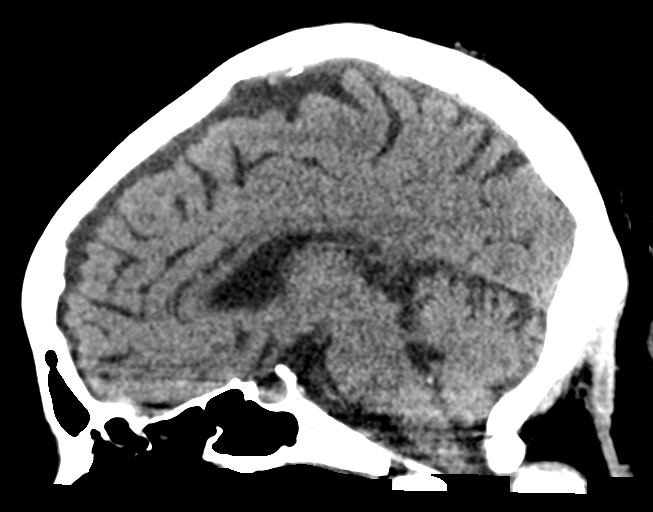
[im 37/56  brain]
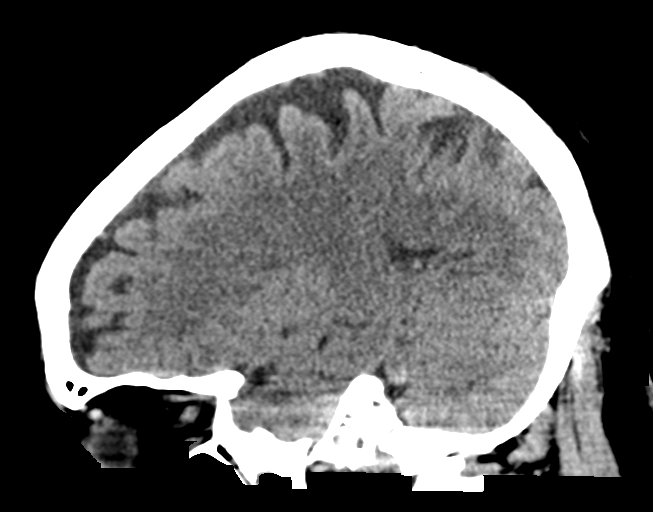

[16 of 47 positions shown; findings below may reference images not displayed]

FINDINGS: Brain: No evidence of acute infarction, hemorrhage, hydrocephalus,
extra-axial collection or mass lesion/mass effect. Stable
nonspecific white matter hypodensities compatible with mild chronic
microvascular ischemic changes. Stable mild volume loss of the brain

Vascular: No hyperdense vessel or unexpected calcification.

Skull: Normal. Negative for fracture or focal lesion.

Sinuses/Orbits: No acute finding.

Other: None.
IMPRESSION: 1. No acute intracranial abnormality identified.
2. Stable mild chronic microvascular ischemic changes and mild
volume loss of the brain.

## 2020-08-26 ENCOUNTER — Other Ambulatory Visit: Payer: Self-pay | Admitting: Neurosurgery

## 2020-08-26 DIAGNOSIS — M4726 Other spondylosis with radiculopathy, lumbar region: Secondary | ICD-10-CM

## 2020-08-30 ENCOUNTER — Telehealth: Payer: Self-pay

## 2020-08-30 DIAGNOSIS — Z006 Encounter for examination for normal comparison and control in clinical research program: Secondary | ICD-10-CM

## 2020-08-30 NOTE — Telephone Encounter (Signed)
Called patient for 90 day Identify phone call no answer, I left a voicemail stating the intent of the phone call and our call back number to be reached in our department. 

## 2020-08-31 DIAGNOSIS — J449 Chronic obstructive pulmonary disease, unspecified: Secondary | ICD-10-CM | POA: Diagnosis not present

## 2020-09-01 ENCOUNTER — Other Ambulatory Visit: Payer: Self-pay | Admitting: Oncology

## 2020-09-01 DIAGNOSIS — I82403 Acute embolism and thrombosis of unspecified deep veins of lower extremity, bilateral: Secondary | ICD-10-CM

## 2020-09-01 DIAGNOSIS — Z7901 Long term (current) use of anticoagulants: Secondary | ICD-10-CM

## 2020-09-04 ENCOUNTER — Ambulatory Visit
Admission: RE | Admit: 2020-09-04 | Discharge: 2020-09-04 | Disposition: A | Discharge status: Home / Self Care | Payer: Medicare Other | Source: Ambulatory Visit | Attending: Neurosurgery | Admitting: Neurosurgery

## 2020-09-04 ENCOUNTER — Other Ambulatory Visit: Payer: Self-pay

## 2020-09-04 DIAGNOSIS — M4807 Spinal stenosis, lumbosacral region: Secondary | ICD-10-CM | POA: Diagnosis not present

## 2020-09-04 DIAGNOSIS — M5126 Other intervertebral disc displacement, lumbar region: Secondary | ICD-10-CM | POA: Diagnosis not present

## 2020-09-04 DIAGNOSIS — M4726 Other spondylosis with radiculopathy, lumbar region: Secondary | ICD-10-CM

## 2020-09-04 DIAGNOSIS — M5127 Other intervertebral disc displacement, lumbosacral region: Secondary | ICD-10-CM | POA: Diagnosis not present

## 2020-09-04 DIAGNOSIS — M48061 Spinal stenosis, lumbar region without neurogenic claudication: Secondary | ICD-10-CM | POA: Diagnosis not present

## 2020-09-04 MED ORDER — GADOBENATE DIMEGLUMINE 529 MG/ML IV SOLN
20.0000 mL | Freq: Once | INTRAVENOUS | Status: AC | PRN
  Administered 2020-09-04: 20 mL via INTRAVENOUS

## 2020-09-06 ENCOUNTER — Encounter: Payer: Self-pay | Admitting: *Deleted

## 2020-09-06 DIAGNOSIS — Z006 Encounter for examination for normal comparison and control in clinical research program: Secondary | ICD-10-CM

## 2020-09-06 NOTE — Research (Signed)
           CADFEM IDENTIFY 90 DAY PHONE CALL Patient called for 90day phone call states she is still having shortness of breath. But says she has asthma other than that no problems. We will follow up with her next February for her final phone call.     [place] Brigham Cobbins, Research assistant  09/06/2020  12:45 p.m.

## 2020-09-07 ENCOUNTER — Other Ambulatory Visit: Payer: Self-pay | Admitting: Family Medicine

## 2020-09-07 DIAGNOSIS — E876 Hypokalemia: Secondary | ICD-10-CM

## 2020-09-07 DIAGNOSIS — I10 Essential (primary) hypertension: Secondary | ICD-10-CM | POA: Diagnosis not present

## 2020-09-07 DIAGNOSIS — I5032 Chronic diastolic (congestive) heart failure: Secondary | ICD-10-CM

## 2020-09-07 DIAGNOSIS — M4726 Other spondylosis with radiculopathy, lumbar region: Secondary | ICD-10-CM | POA: Diagnosis not present

## 2020-09-14 ENCOUNTER — Other Ambulatory Visit: Payer: Self-pay

## 2020-09-15 ENCOUNTER — Telehealth: Payer: Self-pay | Admitting: Family Medicine

## 2020-09-15 ENCOUNTER — Encounter: Payer: Self-pay | Admitting: Family Medicine

## 2020-09-15 ENCOUNTER — Ambulatory Visit (INDEPENDENT_AMBULATORY_CARE_PROVIDER_SITE_OTHER): Payer: Medicare Other | Admitting: Family Medicine

## 2020-09-15 VITALS — BP 140/90 | Temp 98.6°F | Wt 280.4 lb

## 2020-09-15 DIAGNOSIS — Z7901 Long term (current) use of anticoagulants: Secondary | ICD-10-CM | POA: Diagnosis not present

## 2020-09-15 DIAGNOSIS — E782 Mixed hyperlipidemia: Secondary | ICD-10-CM

## 2020-09-15 DIAGNOSIS — J302 Other seasonal allergic rhinitis: Secondary | ICD-10-CM | POA: Diagnosis not present

## 2020-09-15 DIAGNOSIS — E669 Obesity, unspecified: Secondary | ICD-10-CM

## 2020-09-15 DIAGNOSIS — K219 Gastro-esophageal reflux disease without esophagitis: Secondary | ICD-10-CM

## 2020-09-15 DIAGNOSIS — E1169 Type 2 diabetes mellitus with other specified complication: Secondary | ICD-10-CM

## 2020-09-15 DIAGNOSIS — I5032 Chronic diastolic (congestive) heart failure: Secondary | ICD-10-CM | POA: Diagnosis not present

## 2020-09-15 DIAGNOSIS — Z86718 Personal history of other venous thrombosis and embolism: Secondary | ICD-10-CM | POA: Diagnosis not present

## 2020-09-15 LAB — POCT GLYCOSYLATED HEMOGLOBIN (HGB A1C): Hemoglobin A1C: 7.5 % — AB (ref 4.0–5.6)

## 2020-09-15 MED ORDER — LEVOCETIRIZINE DIHYDROCHLORIDE 5 MG PO TABS: 5.0000 mg | ORAL_TABLET | Freq: Every day | ORAL | 1 refills | Status: DC

## 2020-09-15 MED ORDER — OMEPRAZOLE 20 MG PO CPDR: 20.0000 mg | DELAYED_RELEASE_CAPSULE | Freq: Two times a day (BID) | ORAL | 1 refills | Status: DC

## 2020-09-15 MED ORDER — METFORMIN HCL 500 MG PO TABS: 500.0000 mg | ORAL_TABLET | Freq: Every day | ORAL | 1 refills | Status: DC

## 2020-09-15 NOTE — Chronic Care Management (AMB) (Signed)
  Chronic Care Management   Note  09/15/2020 Name: [name] MRN: [id number] DOB: 07/12/1955  [name] is a 65 y.o. year old female who is a primary care patient of Billie Ruddy, MD. I reached out to [facility] by phone today in response to a referral sent by Ms. Kyung Bacca Pollan's PCP, Billie Ruddy, MD.   Ms. Hunnell was given information about Chronic Care Management services today including:  CCM service includes personalized support from designated clinical staff supervised by her physician, including individualized plan of care and coordination with other care providers 24/7 contact phone numbers for assistance for urgent and routine care needs. Service will only be billed when office clinical staff spend 20 minutes or more in a month to coordinate care. Only one practitioner may furnish and bill the service in a calendar month. The patient may stop CCM services at any time (effective at the end of the month) by phone call to the office staff.   Patient agreed to services and verbal consent obtained.   Follow up plan:   [place]ana Secretary/administrator

## 2020-09-15 NOTE — Progress Notes (Signed)
Subjective:    Patient ID: [name], female    DOB: 19-Feb-1956, 65 y.o.   MRN: [id number]  Chief Complaint  Patient presents with   Diabetes   Hypertension    HPI Patient was seen today for f/u on chronic conditions and refills.  Pt request refills on xyzal, metformin, and omeprazole.  Pt states she has been out of metformin x 1 month.  Pt notes some increased stress as her brother has stage IV cancer with mets.  Previously on pravastatin for cholesterol, however out x several months per chart review.  CHF stable.  Denies increased SOB or LE edema.  BP elevated as patient has yet to take Lasix.  Followed by cardiology.  On warfarin for history of bilateral DVTs.  Past Medical History:  Diagnosis Date   Adrenal mass ([place]) 03/226   Benign   Arthritis    knees/multiple orthopedic conditons; lower back   Asthma    per pt   Clotting disorder (HCC)    +beta-2-glycoprotein IgA antibody   COPD (chronic obstructive pulmonary disease) (HCC)    inhalers dependent on environment   Depression    Diabetes mellitus    120s usually fasting -  dx more than 10 yrs ago   Dizziness, nonspecific    DVT (deep venous thrombosis) (HCC)    Recurrent   Fibromyalgia    GERD (gastroesophageal reflux disease)    Heart murmur    History of blood transfusion    History of cocaine abuse ([place])    Remote history    Hypertension    takes meds daily   Hypothyroidism    Lupus Anticoagulant Positive    On home oxygen therapy    at night   Shortness of breath    exertion or lying flat   Sleep apnea    2l of oxygen at night (as of 12/6, she used to)    Allergies  Allergen Reactions   Lisinopril Swelling    THROAT SWELLING Pt states her throat started to "close up" after being on it for "so long."   Corticosteroids Rash    States she developed a rash on her tongue after a steroid injection in her bac    ROS General: Denies fever, chills, night sweats, changes in weight, changes in  appetite HEENT: Denies headaches, ear pain, changes in vision, rhinorrhea, sore throat CV: Denies CP, palpitations, SOB, orthopnea Pulm: Denies SOB, cough, wheezing GI: Denies abdominal pain, nausea, vomiting, diarrhea, constipation GU: Denies dysuria, hematuria, frequency, vaginal discharge Msk: Denies muscle cramps, joint pains Neuro: Denies weakness, numbness, tingling Skin: Denies rashes, bruising Psych: Denies depression, anxiety, hallucinations    Objective:    Blood pressure 140/90, temperature 98.6 F (37 C), temperature source Oral, weight 280 lb 6.4 oz (127.2 kg).  Gen. Pleasant, well-nourished, in no distress, normal affect   HEENT: [place]/AT, face symmetric, conjunctiva clear, no scleral icterus, PERRLA, EOMI, nares patent without drainage Lungs: no accessory muscle use, CTAB, no wheezes or rales Cardiovascular: RRR, no m/r/g, no peripheral edema Abdomen: BS present, soft, NT/ND Musculoskeletal: No deformities, no cyanosis or clubbing, normal tone Neuro:  A&Ox3, CN II-XII intact Skin:  Warm, no lesions/ rash  Wt Readings from Last 3 Encounters:  09/15/20 280 lb 6.4 oz (127.2 kg)  08/18/20 287 lb 11.2 oz (130.5 kg)  08/11/20 287 lb 12.8 oz (130.5 kg)    Lab Results  Component Value Date   WBC 6.1 08/18/2020   HGB 12.9 08/18/2020   HCT  41.6 08/18/2020   PLT 343 08/18/2020   GLUCOSE 123 (H) 08/18/2020   CHOL 194 02/02/2020   TRIG 196 (H) 02/02/2020   HDL 76 02/02/2020   LDLCALC 89 02/02/2020   ALT 22 08/18/2020   AST 24 08/18/2020   NA 139 08/18/2020   K 4.0 08/18/2020   CL 105 08/18/2020   CREATININE 0.70 08/18/2020   BUN 13 08/18/2020   CO2 24 08/18/2020   TSH 4.23 06/07/2020   INR 1.9 (H) 07/12/2020   HGBA1C 7.5 (A) 09/15/2020   MICROALBUR 12.5 (H) 08/11/2020    Assessment/Plan:  Diabetes mellitus type 2 in obese (HCC) -Hemoglobin A1c 7.5% this visit -Continue lifestyle modifications -Continue metformin 500 mg nightly -Patient encouraged to  schedule diabetic retinopathy screen -We will obtain foot exam - Plan: POCT glycosylated hemoglobin (Hb A1C), metFORMIN (GLUCOPHAGE) 500 MG tablet  Chronic heart failure with preserved ejection fraction (HCC) -Stable -Patient encouraged to take Lasix -Continue diet modifications -Continue current medications -Continue follow-up with cardiology  Gastroesophageal reflux disease, unspecified whether esophagitis present  - Plan: omeprazole (PRILOSEC) 20 MG capsule  Mixed hyperlipidemia -continue lifestyle modifications  History of DVT (deep vein thrombosis) -Continue Coumadin 10 mg MWF and 5 mg Tuesdays Thursdays Saturday Sunday  Chronic anticoagulation -2/2 history of bilateral DVTs -Continue Coumadin 10 mg MWF and 5 mg all other days  Seasonal allergies - Plan: levocetirizine (XYZAL) 5 MG tablet  F/u as needed in the next few months  [name], MD

## 2020-09-22 DIAGNOSIS — Z9989 Dependence on other enabling machines and devices: Secondary | ICD-10-CM | POA: Diagnosis not present

## 2020-09-22 DIAGNOSIS — R52 Pain, unspecified: Secondary | ICD-10-CM | POA: Diagnosis not present

## 2020-09-22 DIAGNOSIS — Z789 Other specified health status: Secondary | ICD-10-CM | POA: Diagnosis not present

## 2020-09-22 DIAGNOSIS — G4733 Obstructive sleep apnea (adult) (pediatric): Secondary | ICD-10-CM | POA: Diagnosis not present

## 2020-09-23 ENCOUNTER — Encounter: Payer: Self-pay | Admitting: Family Medicine

## 2020-09-23 ENCOUNTER — Telehealth (INDEPENDENT_AMBULATORY_CARE_PROVIDER_SITE_OTHER): Payer: Medicare Other | Admitting: Family Medicine

## 2020-09-23 DIAGNOSIS — I10 Essential (primary) hypertension: Secondary | ICD-10-CM

## 2020-09-23 DIAGNOSIS — I5032 Chronic diastolic (congestive) heart failure: Secondary | ICD-10-CM | POA: Diagnosis not present

## 2020-09-23 DIAGNOSIS — E1165 Type 2 diabetes mellitus with hyperglycemia: Secondary | ICD-10-CM

## 2020-09-23 DIAGNOSIS — Z9189 Other specified personal risk factors, not elsewhere classified: Secondary | ICD-10-CM | POA: Diagnosis not present

## 2020-09-23 DIAGNOSIS — J449 Chronic obstructive pulmonary disease, unspecified: Secondary | ICD-10-CM | POA: Diagnosis not present

## 2020-09-23 MED ORDER — CARTIA XT 240 MG PO CP24: 240.0000 mg | ORAL_CAPSULE | Freq: Every day | ORAL | 3 refills | Status: DC

## 2020-09-23 NOTE — Progress Notes (Signed)
Virtual Visit via Telephone Note  I connected with [name] on 09/23/20 at 11:30 AM EDT by telephone and verified that I am speaking with the correct person using two identifiers.   I discussed the limitations, risks, security and privacy concerns of performing an evaluation and management service by telephone and the availability of in person appointments. I also discussed with the patient that there may be a patient responsible charge related to this service. The patient expressed understanding and agreed to proceed.  Location patient: home Location provider: work or home office Participants present for the call: patient, provider Patient did not have a visit in the prior 7 days to address this/these issue(s).   History of Present Illness: Pt states she needs a refill on Cartia XT 240 mg, has 4 pills.  Pt states her previous provider Dr. Lowella Dandy was provider her with refills.  Pt has not seen her Cardiologist in a while.  Had f/u with sleep medicine.  CPAP settings adjusted.  Got a bp cuff.  BP was 148/80.    Pt spoke with [facility] today, states they are going to send her a continuous glucometer.  Fsbs 230.  Pt had unsweet jello this am and water.  Taking meds and drinking water.  Also tried vinegar and water.  Had macaroni last night with broccoli for dinner.  Felt pre-syncopal on Tuesday as her bs was 280.  Taking Metformin 500 mg qhs.     Pt mentions she is not sure if she is taking all her meds as she has so many to keep up with.  Observations/Objective: Patient sounds cheerful and well on the phone. I do not appreciate any SOB. Speech and thought processing are grossly intact. Patient reported vitals: BP 148/80, fsbs 230  Assessment and Plan: Uncontrolled type 2 diabetes mellitus with hyperglycemia (HCC)  -Continue Metformin 500 mg - Plan: Ambulatory referral to diabetic education  Essential hypertension -elevated  - Plan: CARTIA XT 240 MG 24 hr capsule  Chronic  diastolic CHF (congestive heart failure) (HCC)  - Plan: CARTIA XT 240 MG 24 hr capsule  At risk for polypharmacy  -concern about pt missing meds and duplicate medications as she has been getting refills from multiple providers, some of which are not listed. - Plan: Amb Referral to Clinical Pharmacist   Follow Up Instructions: F/u in 1-2 months for med review   [id number] [id number] [id number] 11-20 [id number] 21-30 I did not refer this patient for an OV in the next 24 hours for this/these issue(s).  I discussed the assessment and treatment plan with the patient. The patient was provided an opportunity to ask questions and all were answered. The patient agreed with the plan and demonstrated an understanding of the instructions.   The patient was advised to call back or seek an in-person evaluation if the symptoms worsen or if the condition fails to improve as anticipated.  I provided 15:46 minutes of non-face-to-face time during this encounter.   Billie Ruddy, MD

## 2020-09-27 ENCOUNTER — Ambulatory Visit: Payer: Medicare Other | Admitting: Physical Therapy

## 2020-09-28 ENCOUNTER — Other Ambulatory Visit: Payer: Self-pay

## 2020-09-28 ENCOUNTER — Inpatient Hospital Stay: Admission: RE | Admit: 2020-09-28 | Payer: Medicare Other | Source: Ambulatory Visit

## 2020-09-28 ENCOUNTER — Ambulatory Visit: Payer: Medicare Other | Attending: Neurosurgery | Admitting: Physical Therapy

## 2020-09-28 DIAGNOSIS — R262 Difficulty in walking, not elsewhere classified: Secondary | ICD-10-CM

## 2020-09-28 DIAGNOSIS — M6281 Muscle weakness (generalized): Secondary | ICD-10-CM | POA: Diagnosis not present

## 2020-09-28 DIAGNOSIS — M545 Low back pain, unspecified: Secondary | ICD-10-CM | POA: Insufficient documentation

## 2020-09-28 DIAGNOSIS — R2689 Other abnormalities of gait and mobility: Secondary | ICD-10-CM | POA: Diagnosis not present

## 2020-09-28 DIAGNOSIS — G8929 Other chronic pain: Secondary | ICD-10-CM | POA: Diagnosis not present

## 2020-09-28 NOTE — Patient Instructions (Signed)
Access Code: [facility] URL: [email or web address] Date: 09/28/2020 Prepared by: [name]  Exercises Supine Lower Trunk Rotation - 2 x daily - 7 x weekly - 2 sets - 10 reps - 10 hold Seated Hamstring Stretch - 1 x daily - 7 x weekly - 1 sets - 5 reps - 30 hold Supine Posterior Pelvic Tilt - 2 x daily - 7 x weekly - 2 sets - 10 reps - 5 hold Seated Posterior Pelvic Tilt - 2 x daily - 7 x weekly - 2 sets - 10 reps - 5 hold

## 2020-09-29 NOTE — Therapy (Signed)
diabetes, and hypertension syndrome ([place]) 02/27/2020   Vitreomacular adhesion of both eyes 02/09/2020   Pseudophakia of both eyes 02/09/2020   Chronic diastolic CHF (congestive heart failure) ([place]) 03/12/2019   Acute respiratory failure with hypoxia (HCC) 03/09/2019   Hypernatremia 03/09/2019   Suspected COVID-19 virus infection 03/09/2019   Acute encephalopathy 03/09/2019   Right sided weakness 02/20/2019   Thyroid mass 02/27/2017   Chronic pain of right knee 05/24/2016   Effusion, right knee 04/28/2016   Presence of right artificial knee joint 04/28/2016   Abnormal LFTs    Decreased sensation    Paresthesia 06/12/2014   Obstruction of kidney 04/24/2014   Diabetes type 2, uncontrolled ([place]) 04/24/2014   Chronic anticoagulation 04/24/2014   Crack cocaine use 04/24/2014   Depression 04/24/2014   Fibromyalgia 04/24/2014   COPD (chronic obstructive pulmonary disease) ([place]) 04/24/2014   OSA (obstructive sleep apnea) 04/24/2014   Lupus anticoagulant positive 04/24/2014   Adrenal mass, right ([place]) 04/24/2014   Right kidney mass 04/24/2014   Hydronephrosis of right kidney 04/24/2014   Supratherapeutic INR 04/24/2014   Renal mass, right 04/24/2014   Right lower quadrant abdominal pain    Morbid obesity ([place]) 08/21/2013   Constipation 08/19/2013   COPD with acute exacerbation ([place]) 08/17/2013   Morbid obesity with BMI of 45.0-49.9, adult ([place]) 01/05/2012   Bilateral deep vein thromboses ([place]) 12/13/2011   Abdominal pain 11/05/2011   Hypokalemia 11/05/2011   Sciatica 11/05/2011   Diabetes mellitus type 2 in obese ([place]) 11/05/2011   HTN (hypertension) 11/05/2011    [name] 09/29/2020, 8:23 AM  [facility] [address] [place], [place], [address] Phone: [phone]   Fax:  [phone]  Name: [name] MRN: [id number] Date of Birth: October 09, 1955  [name], PT 09/29/20 8:23 AM Phone: [phone] Fax: [phone]  [place], [place], [address] Phone: [phone]   Fax:  [phone]  Physical Therapy Evaluation  Patient Details  Name: [name] MRN: [id number] Date of Birth: 09-Feb-1956 Referring Provider (PT): Dr. [name]   Encounter Date: 09/28/2020   PT End of Session - 09/29/20 0804     Visit Number 1    Number of Visits 8    Date for PT Re-Evaluation 11/23/20    Authorization Type UHC MCR , MCD    PT Start Time 4315    PT Stop Time 1230    PT Time Calculation (min) 45 min    Activity Tolerance Patient tolerated treatment well;Patient limited by fatigue    Behavior During Therapy [facility] for tasks assessed/performed             Past Medical History:  Diagnosis Date   Adrenal mass ([place]) 03/226   Benign   Arthritis    knees/multiple orthopedic conditons; lower back   Asthma    per pt   Clotting disorder (HCC)    +beta-2-glycoprotein IgA antibody   COPD (chronic obstructive pulmonary disease) ([place])    inhalers dependent on environment   Depression    Diabetes mellitus    120s usually fasting -  dx more than 10 yrs ago   Dizziness, nonspecific    DVT (deep venous thrombosis) (HCC)    Recurrent   Fibromyalgia    GERD (gastroesophageal reflux disease)    Heart murmur    History of blood transfusion    History of cocaine abuse ([place])    Remote history    Hypertension    takes meds daily   Hypothyroidism    Lupus Anticoagulant Positive    On home oxygen therapy    at night   Shortness of breath    exertion or lying flat   Sleep apnea    2l of oxygen at night (as of 12/6, she used to)    Past Surgical History:  Procedure Laterality Date   ABDOMINAL HYSTERECTOMY  1989   Fibroids   ANTERIOR LUMBAR FUSION  01/03/2012   Procedure: ANTERIOR LUMBAR FUSION 1 LEVEL;  Surgeon: Winfield Cunas, MD;  [facility]: [facility];  Service: Neurosurgery;  Laterality: N/A;  Lumbar Four-Five  Anterior Lumbar Interbody Fusion with Instrumentation   BACK SURGERY     cervical spine---disk disease   CARPAL TUNNEL RELEASE     Bilateral   CESAREAN SECTION     CHOLECYSTECTOMY     CYSTOSCOPY/RETROGRADE/URETEROSCOPY/STONE EXTRACTION WITH BASKET Right 04/26/2014   Procedure: CYSTOSCOPY/ RIGHT RETROGRADE/ RIGHT URETEROSCOPY/URETERAL AND RENAL PELVIS BIOPSY;  Surgeon: Alexis Frock, MD;  Location: [facility];  Service: Urology;  Laterality: Right;   gallstones removed     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   bilateral   right elbow surgery     THYROID LOBECTOMY Right 02/27/2017   THYROID LOBECTOMY Right 02/27/2017   Procedure: RIGHT THYROID LOBECTOMY;  Surgeon: Erroll Luna, MD;  Location: [facility];  Service: General;  Laterality: Right;   TUBAL LIGATION      There were no vitals filed for this visit.    Subjective Assessment - 09/28/20 1153     Subjective Patient has had pain in her R low back, increased from the usual for the past couple of months.  She has difficulty cleaning her home, cooking and standing even short periods of time.  Has to lean forward in standing.  She has CNA a  [place], [place], [address] Phone: [phone]   Fax:  [phone]  Physical Therapy Evaluation  Patient Details  Name: [name] MRN: [id number] Date of Birth: 09-Feb-1956 Referring Provider (PT): Dr. [name]   Encounter Date: 09/28/2020   PT End of Session - 09/29/20 0804     Visit Number 1    Number of Visits 8    Date for PT Re-Evaluation 11/23/20    Authorization Type UHC MCR , MCD    PT Start Time 4315    PT Stop Time 1230    PT Time Calculation (min) 45 min    Activity Tolerance Patient tolerated treatment well;Patient limited by fatigue    Behavior During Therapy [facility] for tasks assessed/performed             Past Medical History:  Diagnosis Date   Adrenal mass ([place]) 03/226   Benign   Arthritis    knees/multiple orthopedic conditons; lower back   Asthma    per pt   Clotting disorder (HCC)    +beta-2-glycoprotein IgA antibody   COPD (chronic obstructive pulmonary disease) ([place])    inhalers dependent on environment   Depression    Diabetes mellitus    120s usually fasting -  dx more than 10 yrs ago   Dizziness, nonspecific    DVT (deep venous thrombosis) (HCC)    Recurrent   Fibromyalgia    GERD (gastroesophageal reflux disease)    Heart murmur    History of blood transfusion    History of cocaine abuse ([place])    Remote history    Hypertension    takes meds daily   Hypothyroidism    Lupus Anticoagulant Positive    On home oxygen therapy    at night   Shortness of breath    exertion or lying flat   Sleep apnea    2l of oxygen at night (as of 12/6, she used to)    Past Surgical History:  Procedure Laterality Date   ABDOMINAL HYSTERECTOMY  1989   Fibroids   ANTERIOR LUMBAR FUSION  01/03/2012   Procedure: ANTERIOR LUMBAR FUSION 1 LEVEL;  Surgeon: Winfield Cunas, MD;  [facility];  Service: Neurosurgery;  Laterality: N/A;  Lumbar Four-Five  Anterior Lumbar Interbody Fusion with Instrumentation   BACK SURGERY     cervical spine---disk disease   CARPAL TUNNEL RELEASE     Bilateral   CESAREAN SECTION     CHOLECYSTECTOMY     CYSTOSCOPY/RETROGRADE/URETEROSCOPY/STONE EXTRACTION WITH BASKET Right 04/26/2014   Procedure: CYSTOSCOPY/ RIGHT RETROGRADE/ RIGHT URETEROSCOPY/URETERAL AND RENAL PELVIS BIOPSY;  Surgeon: Alexis Frock, MD;  [facility];  Service: Urology;  Laterality: Right;   gallstones removed     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   bilateral   right elbow surgery     THYROID LOBECTOMY Right 02/27/2017   THYROID LOBECTOMY Right 02/27/2017   Procedure: RIGHT THYROID LOBECTOMY;  Surgeon: Erroll Luna, MD;  Location: [facility];  Service: General;  Laterality: Right;   TUBAL LIGATION      There were no vitals filed for this visit.    Subjective Assessment - 09/28/20 1153     Subjective Patient has had pain in her R low back, increased from the usual for the past couple of months.  She has difficulty cleaning her home, cooking and standing even short periods of time.  Has to lean forward in standing.  She has CNA a  [place], [place], [address] Phone: [phone]   Fax:  [phone]  Physical Therapy Evaluation  Patient Details  Name: [name] MRN: [id number] Date of Birth: 09-Feb-1956 Referring Provider (PT): Dr. [name]   Encounter Date: 09/28/2020   PT End of Session - 09/29/20 0804     Visit Number 1    Number of Visits 8    Date for PT Re-Evaluation 11/23/20    Authorization Type UHC MCR , MCD    PT Start Time 4315    PT Stop Time 1230    PT Time Calculation (min) 45 min    Activity Tolerance Patient tolerated treatment well;Patient limited by fatigue    Behavior During Therapy [facility] for tasks assessed/performed             Past Medical History:  Diagnosis Date   Adrenal mass ([place]) 03/226   Benign   Arthritis    knees/multiple orthopedic conditons; lower back   Asthma    per pt   Clotting disorder (HCC)    +beta-2-glycoprotein IgA antibody   COPD (chronic obstructive pulmonary disease) ([place])    inhalers dependent on environment   Depression    Diabetes mellitus    120s usually fasting -  dx more than 10 yrs ago   Dizziness, nonspecific    DVT (deep venous thrombosis) (HCC)    Recurrent   Fibromyalgia    GERD (gastroesophageal reflux disease)    Heart murmur    History of blood transfusion    History of cocaine abuse ([place])    Remote history    Hypertension    takes meds daily   Hypothyroidism    Lupus Anticoagulant Positive    On home oxygen therapy    at night   Shortness of breath    exertion or lying flat   Sleep apnea    2l of oxygen at night (as of 12/6, she used to)    Past Surgical History:  Procedure Laterality Date   ABDOMINAL HYSTERECTOMY  1989   Fibroids   ANTERIOR LUMBAR FUSION  01/03/2012   Procedure: ANTERIOR LUMBAR FUSION 1 LEVEL;  Surgeon: Winfield Cunas, MD;  [facility]: [facility];  Service: Neurosurgery;  Laterality: N/A;  Lumbar Four-Five  Anterior Lumbar Interbody Fusion with Instrumentation   BACK SURGERY     cervical spine---disk disease   CARPAL TUNNEL RELEASE     Bilateral   CESAREAN SECTION     CHOLECYSTECTOMY     CYSTOSCOPY/RETROGRADE/URETEROSCOPY/STONE EXTRACTION WITH BASKET Right 04/26/2014   Procedure: CYSTOSCOPY/ RIGHT RETROGRADE/ RIGHT URETEROSCOPY/URETERAL AND RENAL PELVIS BIOPSY;  Surgeon: Alexis Frock, MD;  [facility];  Service: Urology;  Laterality: Right;   gallstones removed     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   bilateral   right elbow surgery     THYROID LOBECTOMY Right 02/27/2017   THYROID LOBECTOMY Right 02/27/2017   Procedure: RIGHT THYROID LOBECTOMY;  Surgeon: Erroll Luna, MD;  Location: [facility];  Service: General;  Laterality: Right;   TUBAL LIGATION      There were no vitals filed for this visit.    Subjective Assessment - 09/28/20 1153     Subjective Patient has had pain in her R low back, increased from the usual for the past couple of months.  She has difficulty cleaning her home, cooking and standing even short periods of time.  Has to lean forward in standing.  She has CNA a  [place], [place], [address] Phone: [phone]   Fax:  [phone]  Physical Therapy Evaluation  Patient Details  Name: [name] MRN: [id number] Date of Birth: 09-Feb-1956 Referring Provider (PT): Dr. [name]   Encounter Date: 09/28/2020   PT End of Session - 09/29/20 0804     Visit Number 1    Number of Visits 8    Date for PT Re-Evaluation 11/23/20    Authorization Type UHC MCR , MCD    PT Start Time 4315    PT Stop Time 1230    PT Time Calculation (min) 45 min    Activity Tolerance Patient tolerated treatment well;Patient limited by fatigue    Behavior During Therapy [facility] for tasks assessed/performed             Past Medical History:  Diagnosis Date   Adrenal mass ([place]) 03/226   Benign   Arthritis    knees/multiple orthopedic conditons; lower back   Asthma    per pt   Clotting disorder (HCC)    +beta-2-glycoprotein IgA antibody   COPD (chronic obstructive pulmonary disease) ([place])    inhalers dependent on environment   Depression    Diabetes mellitus    120s usually fasting -  dx more than 10 yrs ago   Dizziness, nonspecific    DVT (deep venous thrombosis) (HCC)    Recurrent   Fibromyalgia    GERD (gastroesophageal reflux disease)    Heart murmur    History of blood transfusion    History of cocaine abuse ([place])    Remote history    Hypertension    takes meds daily   Hypothyroidism    Lupus Anticoagulant Positive    On home oxygen therapy    at night   Shortness of breath    exertion or lying flat   Sleep apnea    2l of oxygen at night (as of 12/6, she used to)    Past Surgical History:  Procedure Laterality Date   ABDOMINAL HYSTERECTOMY  1989   Fibroids   ANTERIOR LUMBAR FUSION  01/03/2012   Procedure: ANTERIOR LUMBAR FUSION 1 LEVEL;  Surgeon: Winfield Cunas, MD;  [facility]: [facility];  Service: Neurosurgery;  Laterality: N/A;  Lumbar Four-Five  Anterior Lumbar Interbody Fusion with Instrumentation   BACK SURGERY     cervical spine---disk disease   CARPAL TUNNEL RELEASE     Bilateral   CESAREAN SECTION     CHOLECYSTECTOMY     CYSTOSCOPY/RETROGRADE/URETEROSCOPY/STONE EXTRACTION WITH BASKET Right 04/26/2014   Procedure: CYSTOSCOPY/ RIGHT RETROGRADE/ RIGHT URETEROSCOPY/URETERAL AND RENAL PELVIS BIOPSY;  Surgeon: Alexis Frock, MD;  [facility];  Service: Urology;  Laterality: Right;   gallstones removed     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   bilateral   right elbow surgery     THYROID LOBECTOMY Right 02/27/2017   THYROID LOBECTOMY Right 02/27/2017   Procedure: RIGHT THYROID LOBECTOMY;  Surgeon: Erroll Luna, MD;  Location: [facility];  Service: General;  Laterality: Right;   TUBAL LIGATION      There were no vitals filed for this visit.    Subjective Assessment - 09/28/20 1153     Subjective Patient has had pain in her R low back, increased from the usual for the past couple of months.  She has difficulty cleaning her home, cooking and standing even short periods of time.  Has to lean forward in standing.  She has CNA a

## 2020-09-30 ENCOUNTER — Ambulatory Visit
Admission: RE | Admit: 2020-09-30 | Discharge: 2020-09-30 | Disposition: A | Discharge status: Home / Self Care | Payer: Medicare Other | Source: Ambulatory Visit | Attending: Family Medicine | Admitting: Family Medicine

## 2020-09-30 ENCOUNTER — Other Ambulatory Visit: Payer: Self-pay

## 2020-09-30 DIAGNOSIS — G4733 Obstructive sleep apnea (adult) (pediatric): Secondary | ICD-10-CM | POA: Diagnosis not present

## 2020-09-30 DIAGNOSIS — Z1231 Encounter for screening mammogram for malignant neoplasm of breast: Secondary | ICD-10-CM

## 2020-10-04 ENCOUNTER — Telehealth: Payer: Self-pay | Admitting: Pharmacist

## 2020-10-04 NOTE — Chronic Care Management (AMB) (Signed)
Chronic Care Management Pharmacy Assistant   Name: [name]  MRN: [id number] DOB: [birth year]  Reason for Encounter: Chart Review for CPP visit 10/07/2020.    Conditions to be addressed/monitored: CHF, HTN, COPD, DMII, Depression, and DVT.  Recent office visits:  09/23/2020- Colleen Mitts, MD (PCP)- Patient presented for follow up visit for Uncontrolled type 2 diabetes mellitus with hyperglycemia, Essential hypertension, Chronic diastolic CHF (congestive heart failure). Amb Referral to Clinical Pharmacist and Ambulatory referral to diabetic education,  Return in about 1 month (around 10/24/2020).  09/15/2020-Colleen Volanda Napoleon, MD (PCP)- Patient presented for follow up visit on chronic conditions and refills. Metoprolol 100 mg discontinued. Return in 2 month (on 11/15/2020).  09/07/2020- Telephone Encounter- Colleen Mitts, MD (PCP)- Potassium Chloride changed from 20 meq daily to 10 Meq 2 capsules daily.  08/23/2020- Colleen Mitts, MD (PCP) - B12 1000 mcg administered. Patient returns monthly for injections.  08/18/2020- Colleen Mitts, MD (PCP)- Video Visit- Patient presented for follow up visit on Back pains, No medication changes noted, Return if symptoms worsen or fail to improve.   08/11/2020- Colleen Mitts, MD (PCP)- Patient presented for follow up visit on Back pains, Tizanidine 4 mg changed from 2 times daily prn to 1 every 6 hours PRN. Tramadol 50 mg discontinued.  07/07/2020- Colleen Mitts, MD (PCP)- Video Visit- Patient presented for follow up visit on Thyroid nodule and other chronic conditions. Amb Referral to [facility] Coordination placed, No medication changed noted. Return in about 3 weeks (around 07/28/2020)  06/07/2020- Colleen Mitts, MD (PCP)- Patient presented for Follow up for Thyroid nodule and other chronic conditions. No Medication changes noted. No follow up visit noted.  04/16/2020- Colleen Mitts, MD (PCP)-Patient presented for follow up visit Left shoulder pain. No  medication changes, Return if symptoms worsen or fail to improve.   Recent consult visits:  09/28/2020- [name], PT - (Physical Therapy)- Patient presented for Initial Evaluation for Right low back pain, Follow up in 1 week. No Medication Changes noted.   09/22/2020- Colleen Felling, PA-C (Sleep Medicine)- Patient presented for follow up visit for Obstructive sleep apnea. Return in about 6 months (around 03/25/2021). No Medication Changes noted.  09/07/2020- Colleen Pall, MD- (Neurosurgery)- Patient presented for follow up visit on Back pain, No medication changes noted, no procedures noted, Return visit on 11/08/2020.   09/06/2020- [name], Research Assistant - (Cardiovascular)  09/01/2020- Telephone encounter- [name] (Oncology)- Warfarin Sodium 10 mg changed to 1 tablet once a day on Mondays, Wednesdays and Fridays. Warfarin Sodium 5 mg added to take 1 tablet daily except on Monday, Wednesday and Fridays.  08/24/2020- Colleen Pall, MD- (Neurosurgery)- Patient presented for follow up visit on Back pain, No medication changes , no procedures noted. Return visit 09/07/2020.  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 Furosemide changed from 20 mg to 40 mg 1 tablet daily. Return in 6 months.  05/14/2020- Colleen Coder, MD (Oncology)- Patient presented for Follow up visit on Chronic anticoagulation. Oxycodone 5 mg discontinued. Return in 6 months.  05/06/2020- Colleen Felling, PA-C (Sleep Medicine)- Patient presented for follow up visit for Obstructive sleep apnea. Return in 4 months.   05/05/2020- Telephone Encounter- Colleen Bond RN (Cardiology)- Metoprolol Tartrate 100 mg 1 time dose to take 2 hour prior to scan given and metoprolol succinate and lasix  and other antihistamines placed on  hold.  04/13/2020- Colleen Haskell, MD- (Cardiology)-Patient presented for Follow Up on hx of HFpEF, Morbid Obesity & DM with HTN, COPD and OSA overlap on CPAP, Prior Crack Cocaine Abuse, Covid infection 2020, Unprovoked DVT bilateral legs on Coumadin. Metoprolol Succinate 100 mg- 1 tablet with or immediately following a meal daily added. Follow up in 2-3 months.    Hospital visits:  Medication Reconciliation was completed by comparing discharge summary, patient's EMR and Pharmacy list, and upon discussion with patient.  Admitted to the hospital on 08/18/2020 due to Chronic right-sided low back pain with sciatica, sciatica laterality unspecified. Discharge date was 08/18/2020. Discharged from [facility] Emergency Department.     New?Medications Started at [facility] Discharge:?? -started -None  Medication Changes at Hospital Discharge: -Changed - None  Medications Discontinued at Hospital Discharge: -Stopped - None   Medications that remain the same after Hospital Discharge:??  -All other medications will remain the same.    10/06/2020- Called patient to review initial questions, no answer, left message regarding initial visit appointment with [name], [name], requested all medications, supplements, blood pressure and/or blood sugar logs to be brought with patient to appointment.   Medications: Outpatient Encounter Medications as of 10/04/2020  Medication Sig   ACCU-CHEK GUIDE test strip USE UP TO FOUR TIMES DAILY AS DIRECTED.   Accu-Chek Softclix Lancets lancets USE UP TO FOUR TIMES DAILY AS DIRECTED.   albuterol (PROVENTIL HFA;VENTOLIN HFA) 108 (90 BASE) MCG/ACT inhaler Inhale 2 puffs into the lungs every 4 (four) hours as needed for shortness of breath.   albuterol (PROVENTIL) (2.5 MG/3ML) 0.083% nebulizer solution USE 1 VIAL IN NEBULIZER EVERY 6 HOURS - and as needed   amLODipine (NORVASC) 5 MG tablet Take 5 mg by mouth daily.   ANORO ELLIPTA  62.5-25 MCG/INH AEPB Inhale 1 puff into the lungs daily.    benzonatate (TESSALON) 100 MG capsule Take 100 mg by mouth 3 (three) times daily as needed.   blood glucose meter kit and supplies KIT Dispense based on patient and insurance preference. Use up to four times daily as directed. (FOR ICD-9 250.00, 250.01).   budesonide-formoterol (SYMBICORT) 160-4.5 MCG/ACT inhaler 2 puffs   canagliflozin (INVOKANA) 100 MG TABS tablet Take 1 tablet (100 mg total) by mouth daily before breakfast.   CARTIA XT 240 MG 24 hr capsule Take 1 capsule (240 mg total) by mouth daily.   cloNIDine (CATAPRES) 0.1 MG tablet Take 0.1 mg by mouth 2 (two) times daily.   diclofenac sodium (VOLTAREN) 1 % GEL Apply 2 g topically daily as needed (fibromyalgia pain).   DULoxetine (CYMBALTA) 20 MG capsule Take 20 mg by mouth daily.   DULoxetine (CYMBALTA) 60 MG capsule Take 60 mg by mouth at bedtime.    fluticasone (FLONASE) 50 MCG/ACT nasal spray Place 1 spray into both nostrils daily.    furosemide (LASIX) 40 MG tablet Take 1 tablet (40 mg total) by mouth daily.   gabapentin (NEURONTIN) 300 MG capsule Take 1 capsule (300 mg total) by mouth at bedtime.   levocetirizine (XYZAL) 5 MG tablet Take 1 tablet (5 mg total) by mouth at bedtime.   LINZESS 145 MCG CAPS capsule Take 1 capsule (145 mcg total) by mouth every other day.   meclizine (ANTIVERT) 25 MG tablet Take 1 tablet (25 mg total) by mouth 3 (three) times daily as needed for dizziness.   metFORMIN (GLUCOPHAGE) 500 MG tablet Take 1 tablet (500 mg total) by mouth at  bedtime.   metoCLOPramide (REGLAN) 10 MG tablet Take 1 tablet (10 mg total) by mouth every 6 (six) hours as needed for nausea (or headache).   omeprazole (PRILOSEC) 20 MG capsule Take 1 capsule (20 mg total) by mouth 2 (two) times daily.   potassium chloride (MICRO-K) 10 MEQ CR capsule TAKE 2 CAPSULES(20 MEQ) BY MOUTH DAILY   pravastatin (PRAVACHOL) 40 MG tablet Take 1 tablet (40 mg total) by mouth every evening.    QUEtiapine (SEROQUEL) 50 MG tablet Take 50 mg by mouth at bedtime.    REXULTI 2 MG TABS tablet Take 2 mg by mouth daily.   SYMBICORT 160-4.5 MCG/ACT inhaler SMARTSIG:2 Puff(s) By Mouth Twice Daily   tiZANidine (ZANAFLEX) 4 MG tablet Take 1 tablet (4 mg total) by mouth every 6 (six) hours as needed for muscle spasms.   Vitamin D, Ergocalciferol, (DRISDOL) 1.25 MG (50000 UNIT) CAPS capsule Take 1 capsule (50,000 Units total) by mouth every 7 (seven) days.   warfarin (COUMADIN) 10 MG tablet TAKE 1 TABLET BY MOUTH ONCE A DAY ON MONDAYS, WEDNESDAYS, AND FRIDAYS   warfarin (COUMADIN) 5 MG tablet TAKE 1 TABLET BY MOUTH DAILY EXCEPT ON MONDAY, WEDNESDAY, AND FRIDAY OR AS DIRECTED.   zonisamide (ZONEGRAN) 100 MG capsule Take 1 capsule by mouth as directed. Take 1 tablet in the AM and 2 tablets in the PM   No facility-administered encounter medications on file as of 10/04/2020.    Care Gaps: Mammogram- Performed 09/30/2020 Hepatitis C Screening PAP SMEAR-Modifier COVID-19 Vaccine (3 - Pfizer risk series) FOOT EXAM (Yearly)- Due OPHTHALMOLOGY EXAM- Due  Star Rating Drugs: Pravastatin 40 mg- Last filled 09/23/2020 for 30 day supply at [facility] Metformin 500 mg- Last filled 09/15/2020 for 90 day supply at [facility] 100 mg- Last filled 09/15/2020 for 90 day supply at [place]: Pattricia Boss, [place] Pharmacist Assistant [phone]

## 2020-10-06 ENCOUNTER — Other Ambulatory Visit: Payer: Self-pay

## 2020-10-06 ENCOUNTER — Ambulatory Visit: Payer: Medicare Other | Admitting: Physical Therapy

## 2020-10-06 DIAGNOSIS — R2689 Other abnormalities of gait and mobility: Secondary | ICD-10-CM | POA: Diagnosis not present

## 2020-10-06 DIAGNOSIS — M6281 Muscle weakness (generalized): Secondary | ICD-10-CM

## 2020-10-06 DIAGNOSIS — M545 Low back pain, unspecified: Secondary | ICD-10-CM

## 2020-10-06 DIAGNOSIS — R262 Difficulty in walking, not elsewhere classified: Secondary | ICD-10-CM | POA: Diagnosis not present

## 2020-10-06 DIAGNOSIS — G8929 Other chronic pain: Secondary | ICD-10-CM | POA: Diagnosis not present

## 2020-10-06 NOTE — Therapy (Signed)
[facility] [place], [place], [address] Phone: [phone]   Fax:  [phone]  Physical Therapy Treatment  Patient Details  Name: [name] MRN: [id number] Date of Birth: 1955-07-24 Referring Provider (PT): Dr. [name]   Encounter Date: 10/06/2020   PT End of Session - 10/06/20 0720     Visit Number 2    Number of Visits 8    Date for PT Re-Evaluation 11/23/20    Authorization Type UHC MCR , MCD    PT Start Time 0715    PT Stop Time 1660    PT Time Calculation (min) 42 min             Past Medical History:  Diagnosis Date   Adrenal mass ([place]) 03/226   Benign   Arthritis    knees/multiple orthopedic conditons; lower back   Asthma    per pt   Clotting disorder (HCC)    +beta-2-glycoprotein IgA antibody   COPD (chronic obstructive pulmonary disease) ([place])    inhalers dependent on environment   Depression    Diabetes mellitus    120s usually fasting -  dx more than 10 yrs ago   Dizziness, nonspecific    DVT (deep venous thrombosis) (HCC)    Recurrent   Fibromyalgia    GERD (gastroesophageal reflux disease)    Heart murmur    History of blood transfusion    History of cocaine abuse ([place])    Remote history    Hypertension    takes meds daily   Hypothyroidism    Lupus Anticoagulant Positive    On home oxygen therapy    at night   Shortness of breath    exertion or lying flat   Sleep apnea    2l of oxygen at night (as of 12/6, she used to)    Past Surgical History:  Procedure Laterality Date   ABDOMINAL HYSTERECTOMY  1989   Fibroids   ANTERIOR LUMBAR FUSION  01/03/2012   Procedure: ANTERIOR LUMBAR FUSION 1 LEVEL;  Surgeon: Winfield Cunas, MD;  Location: [facility];  Service: Neurosurgery;  Laterality: N/A;  Lumbar Four-Five Anterior Lumbar Interbody Fusion with Instrumentation   BACK SURGERY     cervical spine---disk disease   CARPAL TUNNEL RELEASE     Bilateral    CESAREAN SECTION     CHOLECYSTECTOMY     CYSTOSCOPY/RETROGRADE/URETEROSCOPY/STONE EXTRACTION WITH BASKET Right 04/26/2014   Procedure: CYSTOSCOPY/ RIGHT RETROGRADE/ RIGHT URETEROSCOPY/URETERAL AND RENAL PELVIS BIOPSY;  Surgeon: Alexis Frock, MD;  Location: [facility];  Service: Urology;  Laterality: Right;   gallstones removed     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   bilateral   right elbow surgery     THYROID LOBECTOMY Right 02/27/2017   THYROID LOBECTOMY Right 02/27/2017   Procedure: RIGHT THYROID LOBECTOMY;  Surgeon: Erroll Luna, MD;  Location: [place];  Service: General;  Laterality: Right;   TUBAL LIGATION      There were no vitals filed for this visit.   Subjective Assessment - 10/06/20 0719     Subjective Just a little pain this morning. Doing alright. I tried a couple of the exercises but we had a family gathering saturday and sunday.    Currently in Pain? Yes    Pain Score 7     Pain Location Back    Pain Orientation Lower    Pain Descriptors / Indicators Aching    Pain Type Chronic pain    Aggravating  Factors  standing, sitting prolonged    Pain Relieving Factors heat                [facility] PT Assessment - 10/06/20 0001       Observation/Other Assessments   Focus on Therapeutic Outcomes (FOTO)  34% , 49% prediction                           [facility] Adult PT Treatment/Exercise - 10/06/20 0001       Lumbar Exercises: Stretches   Active Hamstring Stretch 2 reps;30 seconds    Active Hamstring Stretch Limitations seated    Lower Trunk Rotation 3 reps;30 seconds    Pelvic Tilt 10 reps      Lumbar Exercises: Aerobic   Nustep L5 UE/LE x 5 minutes      Lumbar Exercises: Seated   Other Seated Lumbar Exercises pelvic tilt x 10      Lumbar Exercises: Supine   Large Ball Abdominal Isometric 10 reps    Large Ball Abdominal Isometric Limitations 5 sec                    PT Education - 10/06/20 0744     Education Details FOTO score and  prediction    Person(s) Educated Patient    Methods Explanation;Handout    Comprehension Verbalized understanding              PT Short Term Goals - 10/06/20 0743       PT SHORT TERM [place] #1   Title Pt will be I with HEP for basic back and mobilty exercises    Time 4    Period Weeks    Status On-going    Target Date 10/27/20      PT SHORT TERM [place] #2   Title Pt will complete FOTO and understand results , potential to improve condition    Baseline 34% with predicted 49%    Time 4    Period Weeks    Status Achieved      PT SHORT TERM [place] #3   Title Pt will spend less time in bed, 50% or less of her day to improve condition and overall health.    Time 4    Period Weeks    Status On-going    Target Date 10/27/20               PT Long Term Goals - 09/29/20 0808       PT LONG TERM [place] #1   Title Pt will be independent with more advanced HE    Time 8    Period Weeks    Status New    Target Date 11/24/20      PT LONG TERM [place] #2   Title Pt will be able to improve 5 x  sit to stand time to less than 25 sec with use of UEs    Baseline 35 sec ,used both hands    Time 8    Period Weeks    Status New    Target Date 11/24/20      PT LONG TERM [place] #3   Title Pt will be able to stand for up to 20 min in order to complete shopping, cooking without exacerbation of pain in back    Time 8    Period Weeks    Status New    Target Date 11/24/20      PT LONG  TERM [place] #4   Title FOTO score TBA    Time 8    Period Weeks    Status New    Target Date 11/24/20      PT LONG TERM [place] #5   Title Pt will be able to increase distance in 2 min walk test to > 200 feet with RPE < 15    Time 8    Period Weeks    Status New    Target Date 11/24/20                   Plan - 10/06/20 0753     Clinical Impression Statement FOTO score captured and results reviewed wtih patient. Began wtih Nustep  and reviewed HEP. She reports min compliance with HEP due to a  family gathering this weekend. She required mod cues for all therex. USed physioball to recruit abdominals.    PT Next Visit Plan check HEP, trunk flexibility , core , modalities    PT Home Exercise Plan [facility]             Patient will benefit from skilled therapeutic intervention in order to improve the following deficits and impairments:  Abnormal gait, Cardiopulmonary status limiting activity, Decreased activity tolerance, Decreased strength, Increased fascial restricitons, Impaired UE functional use, Postural dysfunction, Pain, Impaired flexibility, Decreased range of motion, Obesity, Increased edema, Decreased balance, Decreased mobility, Decreased endurance, Difficulty walking, Hypomobility, Dizziness  Visit Diagnosis: Muscle weakness (generalized)  Difficulty in walking, not elsewhere classified  Chronic bilateral low back pain without sciatica  Other abnormalities of gait and mobility     Problem List Patient Active Problem List   Diagnosis Date Noted   Chronic back pain 08/18/2020   Chronic heart failure with preserved ejection fraction (HCC) 02/27/2020   Obesity, diabetes, and hypertension syndrome ([place]) 02/27/2020   Vitreomacular adhesion of both eyes 02/09/2020   Pseudophakia of both eyes 02/09/2020   Chronic diastolic CHF (congestive heart failure) (HCC) 03/12/2019   Acute respiratory failure with hypoxia (HCC) 03/09/2019   Hypernatremia 03/09/2019   Suspected COVID-19 virus infection 03/09/2019   Acute encephalopathy 03/09/2019   Right sided weakness 02/20/2019   Thyroid mass 02/27/2017   Chronic pain of right knee 05/24/2016   Effusion, right knee 04/28/2016   Presence of right artificial knee joint 04/28/2016   Abnormal LFTs    Decreased sensation    Paresthesia 06/12/2014   Obstruction of kidney 04/24/2014   Diabetes type 2, uncontrolled ([place]) 04/24/2014   Chronic anticoagulation 04/24/2014   Crack cocaine use 04/24/2014   Depression 04/24/2014    Fibromyalgia 04/24/2014   COPD (chronic obstructive pulmonary disease) ([place]) 04/24/2014   OSA (obstructive sleep apnea) 04/24/2014   Lupus anticoagulant positive 04/24/2014   Adrenal mass, right ([place]) 04/24/2014   Right kidney mass 04/24/2014   Hydronephrosis of right kidney 04/24/2014   Supratherapeutic INR 04/24/2014   Renal mass, right 04/24/2014   Right lower quadrant abdominal pain    Morbid obesity ([place]) 08/21/2013   Constipation 08/19/2013   COPD with acute exacerbation ([place]) 08/17/2013   Morbid obesity with BMI of 45.0-49.9, adult ([place]) 01/05/2012   Bilateral deep vein thromboses ([place]) 12/13/2011   Abdominal pain 11/05/2011   Hypokalemia 11/05/2011   Sciatica 11/05/2011   Diabetes mellitus type 2 in obese ([place]) 11/05/2011   HTN (hypertension) 11/05/2011    Dorene Ar, PTA 10/06/2020, 7:59 AM  [facility] [place], [place], [address] Phone: [phone]  Fax:  [phone]  Name: [name] MRN: [id number] Date of Birth: 07/15/1955

## 2020-10-07 ENCOUNTER — Ambulatory Visit (INDEPENDENT_AMBULATORY_CARE_PROVIDER_SITE_OTHER): Payer: Medicare Other | Admitting: Pharmacist

## 2020-10-07 DIAGNOSIS — I5032 Chronic diastolic (congestive) heart failure: Secondary | ICD-10-CM | POA: Diagnosis not present

## 2020-10-07 DIAGNOSIS — I10 Essential (primary) hypertension: Secondary | ICD-10-CM

## 2020-10-07 DIAGNOSIS — E1165 Type 2 diabetes mellitus with hyperglycemia: Secondary | ICD-10-CM

## 2020-10-07 NOTE — Progress Notes (Signed)
Chronic Care Management Pharmacy Note  10/21/2020 Name:  [name] MRN:  [id number] DOB:  [birth year]  Summary: LDL not at [place] < 70 A1c not at [place] < 7% BP not at [place] < 130/80  Recommendations/Changes made from today's visit: -Recommended for patient to take potassium 2 tablets daily as prescribed -Consider switching Invokana 100 mg to Jardiance 25 mg due to HF indication -Recommend rechecking vitamin D due to previous deficiency -Recommend increasing pravastatin to 80 mg daily to lower LDL to target   Plan: -Follow up BP assessment in 3-4 weeks   Subjective: [name] is an 65 y.o. year old female who is a primary patient of Volanda Napoleon, Langley Adie, MD.  The CCM team was consulted for assistance with disease management and care coordination needs.    Engaged with patient face to face for initial visit in response to provider referral for pharmacy case management and/or care coordination services.   Consent to Services:  The patient was given the following information about Chronic Care Management services today, agreed to services, and gave verbal consent: 1. CCM service includes personalized support from designated clinical staff supervised by the primary care provider, including individualized plan of care and coordination with other care providers 2. 24/7 contact phone numbers for assistance for urgent and routine care needs. 3. Service will only be billed when office clinical staff spend 20 minutes or more in a month to coordinate care. 4. Only one practitioner may furnish and bill the service in a calendar month. 5.The patient may stop CCM services at any time (effective at the end of the month) by phone call to the office staff. 6. The patient will be responsible for cost sharing (co-pay) of up to 20% of the service fee (after annual deductible is met). Patient agreed to services and consent obtained.  Patient Care Team: Billie Ruddy, MD as PCP - General  (Family Medicine) Viona Gilmore, [facility] as Pharmacist (Pharmacist)  Recent office visits: 09/23/2020- [name], MD (PCP)- Patient presented for follow up visit for chronic conditions. Amb Referral to Clinical Pharmacist and Ambulatory referral to diabetic education. Return in about 1 month (around 10/24/2020).  09/15/2020-[name], MD (PCP)- Patient presented for follow up visit on chronic conditions and refills. Metoprolol 100 mg discontinued. Return in 2 months (on 11/15/2020).  09/07/2020- Telephone Encounter- [name], MD (PCP)- Potassium Chloride changed from 20 meq daily to 10 Meq 2 capsules daily.  08/23/2020- [name], MD (PCP) - B12 1000 mcg administered. Patient returns monthly for injections.   08/18/2020- [name], MD (PCP)- Video Visit- Patient presented for follow up visit on Back pains, No medication changes noted, Return if symptoms worsen or fail to improve.   08/11/2020- [name], MD (PCP)- Patient presented for follow up visit on Back pains, Tizanidine 4 mg changed from 2 times daily prn to 1 every 6 hours PRN. Tramadol 50 mg discontinued.   07/07/2020- [name], MD (PCP)- Video Visit- Patient presented for follow up visit on Thyroid nodule and other chronic conditions. Amb Referral to [facility] Coordination placed, No medication changed noted. Return in about 3 weeks (around 07/28/2020)   06/07/2020- [name], MD (PCP)- Patient presented for Follow up for Thyroid nodule and other chronic conditions. No Medication changes noted. No follow up visit noted.   04/16/2020- [name], MD (PCP)-Patient presented for follow up visit Left shoulder pain. No medication changes, Return if symptoms worsen or fail to improve.  Recent consult visits:  09/28/2020- [name], PT - (Physical Therapy)- Patient presented for Initial Evaluation for Right low back pain, Follow up in 1 week. No Medication Changes noted.   09/22/2020- [name], PA-C (Sleep  Medicine)- Patient presented for follow up visit for Obstructive sleep apnea. Return in about 6 months (around 03/25/2021). No Medication Changes noted.   09/07/2020- [name], MD- (Neurosurgery)- Patient presented for follow up visit on Back pain, No medication changes noted, no procedures noted, Return visit on 11/08/2020.   09/06/2020- [name], Research Assistant - (Cardiovascular)   09/01/2020- Telephone encounter- [name] (Oncology)- Warfarin Sodium 10 mg changed to 1 tablet once a day on Mondays, Wednesdays and Fridays. Warfarin Sodium 5 mg added to take 1 tablet daily except on Monday, Wednesday and Fridays.   08/24/2020- [name], MD- (Neurosurgery)- Patient presented for follow up visit on Back pain, No medication changes , no procedures noted. Return visit 09/07/2020.   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 Furosemide changed from 20 mg to 40 mg 1 tablet daily. Return in 6 months.   05/14/2020- [name], MD (Oncology)- Patient presented for Follow up visit on Chronic anticoagulation. Oxycodone 5 mg discontinued. Return in 6 months.   05/06/2020- [name], PA-C (Sleep Medicine)- Patient presented for follow up visit for Obstructive sleep apnea. Return in 4 months.    05/05/2020- Telephone Encounter- [name] RN (Cardiology)- Metoprolol Tartrate 100 mg 1 time dose to take 2 hour prior to scan given and metoprolol succinate and lasix and other antihistamines placed on hold.   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 Metoprolol Succinate 100 mg- 1 tablet  with or immediately following a meal daily added. Follow up in 2-3 months.   Hospital visits: Medication Reconciliation was completed by comparing discharge summary, patient's EMR and Pharmacy list, and upon discussion with patient.   Admitted to the hospital on 08/18/2020 due to Chronic right-sided low back pain with sciatica, sciatica laterality unspecified. Discharge date was 08/18/2020. Discharged from [facility] Emergency Department.      New?Medications Started at [facility] Discharge:?? -started -None   Medication Changes at Hospital Discharge: -Changed - None   Medications Discontinued at Hospital Discharge: -Stopped - None    Medications that remain the same after Hospital Discharge:?? -All other medications will remain the same.     Objective:  Lab Results  Component Value Date   CREATININE 0.70 08/18/2020   BUN 13 08/18/2020   GFR 89.49 06/07/2020   GFRNONAA >60 08/18/2020   GFRAA 94 03/05/2020   NA 139 08/18/2020   K 4.0 08/18/2020   CALCIUM 8.8 (L) 08/18/2020   CO2 24 08/18/2020   GLUCOSE 123 (H) 08/18/2020    Lab Results  Component Value Date/Time   HGBA1C 7.5 (A) 09/15/2020 02:28 PM   HGBA1C 8.7 (H) 06/07/2020 10:00 AM   HGBA1C 8.8 (H) 02/02/2020 11:26 AM   GFR 89.49 06/07/2020 10:00 AM   MICROALBUR 12.5 (H) 08/11/2020 11:16 AM    Last diabetic Eye exam:  Lab Results  Component Value Date/Time   HMDIABEYEEXA No Retinopathy 08/26/2019 12:01 PM    Last diabetic Foot  exam: No results found for: HMDIABFOOTEX   Lab Results  Component Value Date   CHOL 194 02/02/2020   HDL 76 02/02/2020   LDLCALC 89 02/02/2020   TRIG 196 (H) 02/02/2020   CHOLHDL 2.6 02/02/2020    Hepatic Function Latest Ref Rng & Units 08/18/2020 02/02/2020 03/15/2019  Total Protein 6.5 - 8.1 g/dL 7.7 8.5(H) 7.2  Albumin 3.5 - 5.0 g/dL 3.6 - 2.8(L)  AST 15 - 41 U/L 24 18 41  ALT 0 - 44 U/L 22 21 50(H)  Alk Phosphatase 38 - 126 U/L 80 - 66  Total Bilirubin 0.3 - 1.2  mg/dL 0.4 0.2 0.5    Lab Results  Component Value Date/Time   TSH 4.23 06/07/2020 10:00 AM   TSH 6.50 (H) 02/02/2020 11:26 AM   FREET4 0.59 (L) 06/07/2020 10:00 AM   FREET4 0.8 02/02/2020 11:26 AM    CBC Latest Ref Rng & Units 08/18/2020 06/07/2020 05/14/2020  WBC 4.0 - 10.5 K/uL 6.1 7.3 7.0  Hemoglobin 12.0 - 15.0 g/dL 12.9 11.9(L) 12.5  Hematocrit 36.0 - 46.0 % 41.6 36.4 39.3  Platelets 150 - 400 K/uL 343 285.0 321    Lab Results  Component Value Date/Time   VD25OH 16.33 (L) 06/07/2020 10:00 AM   VD25OH 6.7 (L) 06/13/2014 06:30 AM    Clinical ASCVD: No  The 10-year ASCVD risk score Mikey Bussing DC Jr., et al., 2013) is: 31.9%   Values used to calculate the score:     Age: 11 years     Sex: Female     Is Non-Hispanic African American: Yes     Diabetic: Yes     Tobacco smoker: No     Systolic Blood Pressure: 160 mmHg     Is BP treated: Yes     HDL Cholesterol: 76 mg/dL     Total Cholesterol: 194 mg/dL    Depression screen [facility] 2/9 03/05/2020 04/23/2019  Decreased Interest 0 2  Down, Depressed, Hopeless 0 -  PHQ - 2 Score 0 2  Altered sleeping 0 0  Tired, decreased energy 0 3  Change in appetite 0 0  Feeling bad or failure about yourself  0 2  Trouble concentrating 0 2  Moving slowly or fidgety/restless 0 2  Suicidal thoughts 0 0  PHQ-9 Score 0 11  Difficult doing work/chores Not difficult at all -  Some recent data might be hidden    mMRC Dyspnea Scale mMRC Score  08/12/2019 1   CAT Score 08/12/2019  Total CAT Score 21     Social History   Tobacco Use  Smoking Status Never  Smokeless Tobacco Never   BP Readings from Last 3 Encounters:  09/15/20 140/90  08/18/20 (!) 175/106  08/11/20 126/90   Pulse Readings from Last 3 Encounters:  08/18/20 76  08/11/20 75  06/11/20 90   Wt Readings from Last 3 Encounters:  09/15/20 280 lb 6.4 oz (127.2 kg)  08/18/20 287 lb 11.2 oz (130.5 kg)  08/11/20 287 lb 12.8 oz (130.5 kg)   BMI Readings from Last 3 Encounters:   09/15/20 45.26 kg/m  08/18/20 46.44 kg/m  08/11/20 46.45 kg/m    Assessment/Interventions: Review of patient past medical history, allergies, medications, health status, including review of consultants reports, laboratory and other test data, was performed as part of comprehensive evaluation and provision of chronic care management services.   SDOH:  (Social Determinants of Health) assessments and interventions performed: Yes SDOH Interventions    Flowsheet Row Most Recent Value  SDOH Interventions   Financial Strain Interventions Intervention Not Indicated  Transportation Interventions Intervention Not Indicated      SDOH Screenings   Alcohol Screen: Low Risk    Last Alcohol Screening Score (AUDIT): 0  Depression (PHQ2-9): Low Risk    PHQ-2 Score: 0  Financial Resource Strain: Low Risk    Difficulty of Paying Living Expenses: Not hard at all  Food Insecurity: No Food Insecurity   Worried About Charity fundraiser in the Last Year: Never true   Ran Out of Food in the Last Year: Never true  Housing: Low Risk    Last Housing Risk Score: 0  Physical Activity: Inactive   Days of Exercise per Week: 0 days   Minutes of Exercise per Session: 0 min  Social Connections: Moderately Isolated   Frequency of Communication with Friends and Family: More than three times a week   Frequency of Social Gatherings with Friends and Family: More than three times a week   Attends Religious Services: More than 4 times per year   Active Member of [employer] or Organizations: No   Attends Archivist Meetings: Never   Marital Status: Divorced  Stress: No Stress Concern Present   Feeling of Stress : Not at all  Tobacco Use: Low Risk    Smoking Tobacco Use: Never   Smokeless Tobacco Use: Never  Transportation Needs: No Transportation Needs   Lack of Transportation (Medical): No   Lack of Transportation (Non-Medical): No   Patient just started PT for her back and she goes to them twice a  week. She reports she has been doing so so so far and she has been doing them some at home along with walking down the hallway.  Patient finds that it is difficult to keep up with her medications and is very interested in pill packaging because it is difficult for her to sort through them all.  CCM Care Plan  Allergies  Allergen Reactions   Lisinopril Swelling    THROAT SWELLING Pt states her throat started to "close up" after being on it for "so long."   Corticosteroids Rash    States she developed a rash on her tongue after a steroid injection in her bac    Medications Reviewed Today     Reviewed by Deon Pilling, PTA (Physical Therapy Assistant) on 10/20/20 at [place] List Status: <None>   Medication Order Taking? Sig Documenting Provider Last Dose Status Informant  ACCU-CHEK GUIDE test strip [id number] No USE UP TO FOUR TIMES DAILY AS DIRECTED. Billie Ruddy, MD Taking Active   Accu-Chek Softclix Lancets lancets [id number] No USE UP TO FOUR TIMES DAILY AS DIRECTED. Billie Ruddy, MD Taking Active   albuterol (PROVENTIL HFA;VENTOLIN HFA) 108 (90 BASE) MCG/ACT inhaler [id number] No Inhale 2 puffs into the lungs every 4 (four) hours as needed for shortness of breath. [provider] Taking Active Child  albuterol (PROVENTIL) (2.5 MG/3ML) 0.083% nebulizer solution [id number] No USE 1 VIAL IN NEBULIZER EVERY 6 HOURS - and as needed Billie Ruddy, MD Taking Active   amLODipine (NORVASC) 5 MG tablet [id number] No Take 5 mg by mouth daily.  Patient not taking: Reported on 10/07/2020   [provider] Not Taking Active Child  blood glucose meter kit and supplies KIT [id number] No Dispense based on patient and insurance preference. Use up to four times daily as directed. (FOR ICD-9 250.00, 250.01). Billie Ruddy, MD Taking Active   Brexpiprazole 3  MG TABS [id number] No Take 3 mg by mouth daily. [provider] Taking Active Child  canagliflozin (INVOKANA)  100 MG TABS tablet [id number] No Take 1 tablet (100 mg total) by mouth daily before breakfast. Chandrasekhar, [name] A, MD Taking Active   CARTIA XT 240 MG 24 hr capsule [id number] No Take 1 capsule (240 mg total) by mouth daily. Billie Ruddy, MD Taking Active   cloNIDine (CATAPRES) 0.1 MG tablet [id number] No Take 0.1 mg by mouth 2 (two) times daily. [provider] Taking Active Child  diclofenac sodium (VOLTAREN) 1 % GEL [id number] No Apply 2 g topically daily as needed (fibromyalgia pain). [provider] Taking Active Child  DULoxetine (CYMBALTA) 20 MG capsule [id number] No Take 20 mg by mouth daily. [provider] Taking Active Child  DULoxetine (CYMBALTA) 60 MG capsule [id number] No Take 60 mg by mouth at bedtime.  [provider] Taking Active Child           Med Note Eulas Post, CRYSTAL D   Tue May 04, 2015  3:39 AM)    fluticasone (FLONASE) 50 MCG/ACT nasal spray [id number] No Place 1 spray into both nostrils daily.  [provider] Taking Active Child  furosemide (LASIX) 40 MG tablet [id number] No Take 1 tablet (40 mg total) by mouth daily. Werner Lean, MD Taking Active   gabapentin (NEURONTIN) 300 MG capsule [id number] No Take 1 capsule (300 mg total) by mouth at bedtime. Billie Ruddy, MD Taking Active   levocetirizine (XYZAL) 5 MG tablet [id number] No Take 1 tablet (5 mg total) by mouth at bedtime. Billie Ruddy, MD Taking Active   LINZESS 145 MCG CAPS capsule [id number] No Take 1 capsule (145 mcg total) by mouth every other day. Billie Ruddy, MD Taking Active   meclizine (ANTIVERT) 25 MG tablet [id number] No Take 1 tablet (25 mg total) by mouth 3 (three) times daily as needed for dizziness. Charlesetta Shanks, MD Taking Active   metFORMIN (GLUCOPHAGE) 500 MG tablet [id number] No Take 1 tablet (500 mg total) by mouth at bedtime. Billie Ruddy, MD Taking Active   metoCLOPramide (REGLAN) 10 MG tablet [id number] No Take 1 tablet (10 mg  total) by mouth every 6 (six) hours as needed for nausea (or headache). Delora Fuel, MD Taking Active   omeprazole (PRILOSEC) 20 MG capsule [id number] No Take 1 capsule (20 mg total) by mouth 2 (two) times daily. Billie Ruddy, MD Taking Active   potassium chloride (MICRO-K) 10 MEQ CR capsule [id number] No TAKE 2 CAPSULES(20 MEQ) BY MOUTH DAILY Billie Ruddy, MD Taking Active   pravastatin (PRAVACHOL) 40 MG tablet [id number] No Take 1 tablet (40 mg total) by mouth every evening. Billie Ruddy, MD Taking Active   SYMBICORT 160-4.5 MCG/ACT inhaler [id number] No SMARTSIG:2 Puff(s) By Mouth Twice Daily [provider] Taking Active   warfarin (COUMADIN) 10 MG tablet [id number] No TAKE 1 TABLET BY MOUTH ONCE A DAY ON MONDAYS, WEDNESDAYS, AND Raelyn Number Ladell Pier, MD Taking Active   warfarin (COUMADIN) 5 MG tablet [id number] No TAKE 1 TABLET BY MOUTH DAILY EXCEPT ON MONDAY, WEDNESDAY, AND FRIDAY OR AS DIRECTED. Ladell Pier, MD Taking Active            Med Note Constance Haw, Tommy Rainwater   Fri Jun 11, 2020 10:25 AM)    zonisamide (ZONEGRAN) 100 MG capsule [id number] No Take 1 capsule by mouth as directed. 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   Pseudophakia of both eyes 02/09/2020   Chronic diastolic CHF (congestive heart failure) ([place]) 03/12/2019   Acute respiratory failure with hypoxia ([place]) 03/09/2019   Hypernatremia 03/09/2019   Suspected COVID-19 virus infection 03/09/2019   Acute encephalopathy 03/09/2019   Right sided weakness 02/20/2019   Thyroid mass 02/27/2017   Chronic pain of right knee 05/24/2016   Effusion, right knee 04/28/2016    Presence of right artificial knee joint 04/28/2016   Abnormal LFTs    Decreased sensation    Paresthesia 06/12/2014   Obstruction of kidney 04/24/2014   Diabetes type 2, uncontrolled ([place]) 04/24/2014   Chronic anticoagulation 04/24/2014   Crack cocaine use 04/24/2014   Depression 04/24/2014   Fibromyalgia 04/24/2014   COPD (chronic obstructive pulmonary disease) ([place]) 04/24/2014   OSA (obstructive sleep apnea) 04/24/2014   Lupus anticoagulant positive 04/24/2014   Adrenal mass, right ([place]) 04/24/2014   Right kidney mass 04/24/2014   Hydronephrosis of right kidney 04/24/2014   Supratherapeutic INR 04/24/2014   Renal mass, right 04/24/2014   Right lower quadrant abdominal pain    Morbid obesity ([place]) 08/21/2013   Constipation 08/19/2013   COPD with acute exacerbation ([place]) 08/17/2013   Morbid obesity with BMI of 45.0-49.9, adult ([place]) 01/05/2012   Bilateral deep vein thromboses ([place]) 12/13/2011   Abdominal pain 11/05/2011   Hypokalemia 11/05/2011   Sciatica 11/05/2011   Diabetes mellitus type 2 in obese ([place]) 11/05/2011   HTN (hypertension) 11/05/2011    Immunization History  Administered Date(s) Administered   Influenza Split 01/08/2012   Influenza,inj,Quad PF,6+ Mos 12/31/2018   PFIZER(Purple Top)SARS-COV-2 Vaccination 06/30/2019, 07/21/2019   Pneumococcal Polysaccharide-23 01/08/2012    Conditions to be addressed/monitored:  Hypertension, Hyperlipidemia, Diabetes, Heart Failure, COPD, and Depression  Care Plan : [place]  Updates made by Viona Gilmore, [place] since 10/21/2020 12:00 AM     Problem: Problem: Hypertension, Hyperlipidemia, Diabetes, Heart Failure, COPD, and Depression      Long-Range [place]: Patient-Specific [place]   Start Date: 10/07/2020  Expected End Date: 10/07/2021  This Visit's Progress: On track  Priority: High  Note:   Current Barriers:  Unable to independently monitor therapeutic efficacy Unable to achieve control of diabetes or  cholesterol  Unable to maintain control of blood pressure  Pharmacist Clinical Wiemers(s):  Patient will achieve adherence to monitoring guidelines and medication adherence to achieve therapeutic efficacy achieve control of diabetes and cholesterol as evidenced by next A1c and lipid panel maintain control of blood pressure as evidenced by home and office BP readings  through collaboration with PharmD and provider.   Interventions: 1:1 collaboration with Billie Ruddy, MD regarding development and update of comprehensive plan of care as evidenced by provider attestation and co-signature Inter-disciplinary care team collaboration (see longitudinal plan of care) Comprehensive medication review performed; medication list updated in electronic medical record  Hypertension   (BP [place] <130/80) -Uncontrolled -Current treatment: Clonidine 0.1 mg 1 tablet twice daily Cartia XT 240 mg -Medications previously tried: amlodipine (taking diltiazem), lisinopril (throat swelling) -Current home readings: 150-160 before medications (bought a wrist cuff) -Current dietary habits: uses black pepper; no salt -Current exercise  habits: not consistently -Denies hypotensive/hypertensive symptoms -Educated on BP goals and benefits of medications for prevention of heart attack, stroke and kidney damage; Exercise [place] of 150 minutes per week; Importance of home blood pressure monitoring; Proper BP monitoring technique; -Counseled to monitor BP at home twice weekly, document, and provide log at future appointments -Counseled on diet and exercise extensively Recommended to continue current medication  Hyperlipidemia: (LDL [place] < 70) -Uncontrolled -Current treatment: Pravastatin 40 mg 1 tablet every evening  -Medications previously tried: none  -Current dietary patterns: chicken or pork chop for meat -Current exercise habits: not consistently; some PT exercises -Educated on Cholesterol goals;  Benefits of  statin for ASCVD risk reduction; Importance of limiting foods high in cholesterol; Exercise [place] of 150 minutes per week; -Counseled on diet and exercise extensively Recommended increasing pravastatin to target [place] LDL  Diabetes (A1c [place] <7%) -Uncontrolled -Current medications: Invokana 100 mg 1 tablet daily Metformin 500 mg 1 tablet at bedtime -Medications previously tried: none  -Current home glucose readings fasting glucose: 140, 280, 121, 160 (2 times a day)  - 160s at bedtime post prandial glucose: not checking -Denies hypoglycemic/hyperglycemic symptoms -Current meal patterns:  breakfast: no breakfast except on Thursdays from church (eggs, toast, ham)  lunch: sandwich, hot dogs, hamburgers dinner: cabbage, boiled chicken, salads snacks: fruit drinks: ginger ale - one a day; water; backed off pineapple-orange soda  -Current exercise: PT exercises some -Educated on A1c and blood sugar goals; Exercise [place] of 150 minutes per week; Benefits of routine self-monitoring of blood sugar; Carbohydrate counting and/or plate method -Counseled to check feet daily and get yearly eye exams -Counseled on diet and exercise extensively Recommended to continue current medication Recommended switching to Jardiance for HF benefit.  Heart Failure ([place]: manage symptoms and prevent exacerbations) -Controlled -Last ejection fraction: 60-65% (Date: 05/04/20) -HF type: Diastolic -NYHA Class: II (slight limitation of activity) -AHA HF Stage: C (Heart disease and symptoms present) -Current treatment: Furosemide 40 mg 1 tablet daily -Medications previously tried: lisinopril -Current home BP/HR readings: 150-160s at home -Current dietary habits: limits salt intake -Current exercise habits: limited exercise -Educated on Importance of weighing daily; if you gain more than 3 pounds in one day or 5 pounds in one week, call cardiologist. Proper diuretic administration and potassium  supplementation Importance of blood pressure control -Counseled on diet and exercise extensively Recommended to continue current medication  COPD ([place]: control symptoms and prevent exacerbations) -Controlled -Current treatment  Symbicort 2 puffs twice a day Albuterol nebulizer as needed Albuterol HFA as needed -Medications previously tried: unknown  -Gold Grade: Gold 2 (FEV1 50-79%) -Current COPD Classification:  B (high sx, <2 exacerbations/yr) -MMRC/CAT score: 1 -Pulmonary function testing: 07/2019 -Exacerbations requiring treatment in last 6 months: none -Patient reports consistent use of maintenance inhaler -Frequency of rescue inhaler use: rarely -Counseled on Proper inhaler technique; When to use rescue inhaler Differences between maintenance and rescue inhalers -Counseled on diet and exercise extensively Recommended to continue current medication  Depression/Anxiety ([place]: minimize symptoms) -Controlled -Current treatment: Rexulti 3 mg 1 tablet daily Duloxetine 60 mg  1 capsule daily Duloxetine 20 mg 1 capsule daily -Medications previously tried/failed: unknown -PHQ9: 0 -GAD7: 14 -Educated on Benefits of medication for symptom control Benefits of cognitive-behavioral therapy with or without medication -Recommended to continue current medication  Pain/fibromyalgia ([place]: minimize pain) -Uncontrolled -Current treatment  Diclofenac gel 1% as needed Duloxetine 20 mg and 60 mg daily Gabapentin 300 mg 1 capsule at bedtime -Medications previously tried: unknown  -  Recommended to continue current medication  Allergic rhinitis ([place]: minimize symptoms) -Controlled -Current treatment  Flonase nasal spray as needed Levocetirizine 5 mg 1 tablet at bedtime -Medications previously tried: none  -Recommended to continue current medication  Seizures ([place]: prevent seizures) -Controlled -Current treatment  Zonisamide 100 mg 1 capsule at breakfast and 2 capsules at  bedtime -Medications previously tried: none  -Recommended to continue current medication  Bilateral DVT ([place]: prevent blood clots) -Controlled -Current treatment  Warfarin 5 mg or 10 mg tablets as directed -Medications previously tried: none  -Counseled on diet and exercise extensively Recommended to continue current medication  GERD ([place]ilorenzo: minimize heartburn/acid reflux) -Controlled -Current treatment  Omeprazole 20 mg 1 capsule twice daily - currently taking once daily -Medications previously tried: none  -Counseled on long term risks of taking PPIs. Recommended tapering and stopping omeprazole.   Health Maintenance -Vaccine gaps: shingrix, Prevnar 20, COVID booster -Current therapy:  Meclizine 25 mg as needed Linzess 145 mcg 1 capsule every other day Metoclopramide 10 mg as needed -Educated on Cost vs benefit of each product must be carefully weighed by individual consumer -Patient is satisfied with current therapy and denies issues -Recommended to continue current medication  Patient Goals/Self-Care Activities Patient will:  - take medications as prescribed focus on medication adherence by using adherence packaging check blood pressure twice weekly, document, and provide at future appointments target a minimum of 150 minutes of moderate intensity exercise weekly  Follow Up Plan: The care management team will reach out to the patient again over the next 30 days.        Medication Assistance: None required.  Patient affirms current coverage meets needs.  Compliance/Adherence/Medication fill history: Care Gaps: Foot exam, hep C screening, shingrix, pap smear, covid booster, eye exam  Star-Rating Drugs: Pravastatin 40 mg- Last filled 09/23/2020 for 30 day supply at [facility] Metformin 500 mg- Last filled 09/15/2020 for 90 day supply at [facility] 100 mg- Last filled 09/15/2020 for 90 day supply at [facility]  Patient's preferred  pharmacy is:  [place], [place] [address] Phone: [phone] Fax: [phone]  [facility] [place], [address] [place] [address] Phone: [phone] Fax: [phone]  [place], [address] [place] [address] Phone: [phone] Fax: [phone]  Uses pill box? Yes Pt endorses 80% compliance  We discussed: Benefits of medication synchronization, packaging and delivery as well as enhanced pharmacist oversight with [facility]. Patient decided to: Utilize [facility] pharmacy for medication synchronization, packaging and delivery  Care Plan and Follow Up Patient Decision:  Patient agrees to Care Plan and Follow-up.  Plan: The care management team will reach out to the patient again over the next 30 days.  [name], PharmD, [place] Pharmacist [facility]

## 2020-10-13 ENCOUNTER — Ambulatory Visit: Payer: Medicare Other

## 2020-10-13 ENCOUNTER — Other Ambulatory Visit: Payer: Self-pay

## 2020-10-13 DIAGNOSIS — G8929 Other chronic pain: Secondary | ICD-10-CM

## 2020-10-13 DIAGNOSIS — R2689 Other abnormalities of gait and mobility: Secondary | ICD-10-CM | POA: Diagnosis not present

## 2020-10-13 DIAGNOSIS — M545 Low back pain, unspecified: Secondary | ICD-10-CM | POA: Diagnosis not present

## 2020-10-13 DIAGNOSIS — M6281 Muscle weakness (generalized): Secondary | ICD-10-CM

## 2020-10-13 DIAGNOSIS — R262 Difficulty in walking, not elsewhere classified: Secondary | ICD-10-CM

## 2020-10-13 NOTE — Therapy (Signed)
[place], [place], [address] Phone: [phone]   Fax:  [phone]  Physical Therapy Treatment  Patient Details  Name: [name] MRN: [id number] Date of Birth: [birth year] Referring Provider (PT): Dr. [name]   Encounter Date: 10/13/2020   PT End of Session - 10/13/20 0927     Visit Number 3    Number of Visits 8    Date for PT Re-Evaluation 11/23/20    Authorization Type UHC MCR , MCD    Progress Note Due on Visit 10    PT Start Time 0930    PT Stop Time 1009    PT Time Calculation (min) 39 min    Activity Tolerance Patient tolerated treatment well    Behavior During Therapy [facility] for tasks assessed/performed             Past Medical History:  Diagnosis Date   Adrenal mass ([place]) 03/226   Benign   Arthritis    knees/multiple orthopedic conditons; lower back   Asthma    per pt   Clotting disorder (HCC)    +beta-2-glycoprotein IgA antibody   COPD (chronic obstructive pulmonary disease) ([place])    inhalers dependent on environment   Depression    Diabetes mellitus    120s usually fasting -  dx more than 10 yrs ago   Dizziness, nonspecific    DVT (deep venous thrombosis) (HCC)    Recurrent   Fibromyalgia    GERD (gastroesophageal reflux disease)    Heart murmur    History of blood transfusion    History of cocaine abuse ([place])    Remote history    Hypertension    takes meds daily   Hypothyroidism    Lupus Anticoagulant Positive    On home oxygen therapy    at night   Shortness of breath    exertion or lying flat   Sleep apnea    2l of oxygen at night (as of 12/6, she used to)    Past Surgical History:  Procedure Laterality Date   ABDOMINAL HYSTERECTOMY  1989   Fibroids   ANTERIOR LUMBAR FUSION  01/03/2012   Procedure: ANTERIOR LUMBAR FUSION 1 LEVEL;  Surgeon: Winfield Cunas, MD;  [facility];  Service: Neurosurgery;  Laterality: N/A;  Lumbar Four-Five  Anterior Lumbar Interbody Fusion with Instrumentation   BACK SURGERY     cervical spine---disk disease   CARPAL TUNNEL RELEASE     Bilateral   CESAREAN SECTION     CHOLECYSTECTOMY     CYSTOSCOPY/RETROGRADE/URETEROSCOPY/STONE EXTRACTION WITH BASKET Right 04/26/2014   Procedure: CYSTOSCOPY/ RIGHT RETROGRADE/ RIGHT URETEROSCOPY/URETERAL AND RENAL PELVIS BIOPSY;  Surgeon: Alexis Frock, MD;  Location: [facility];  Service: Urology;  Laterality: Right;   gallstones removed     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   bilateral   right elbow surgery     THYROID LOBECTOMY Right 02/27/2017   THYROID LOBECTOMY Right 02/27/2017   Procedure: RIGHT THYROID LOBECTOMY;  Surgeon: Erroll Luna, MD;  Location: [facility];  Service: General;  Laterality: Right;   TUBAL LIGATION      There were no vitals filed for this visit.                      [facility] Adult PT Treatment/Exercise - 10/13/20 0001       Lumbar Exercises: Stretches   Active Hamstring Stretch 60 seconds    Active Hamstring Stretch Limitations seated; bilateral  Lower Trunk Rotation 60 seconds    Figure 4 Stretch 30 seconds    Figure 4 Stretch Limitations x2; bilateral      Lumbar Exercises: Aerobic   Nustep L5 UE/LE x 5 minutes      Lumbar Exercises: Seated   Long Arc Quad on Chair 10 reps    LAQ on Chair Weights (lbs) 2    LAQ on Chair Limitations x2; bilateral      Lumbar Exercises: Supine   Bridge Limitations 2 x 6, partial range      Lumbar Exercises: Sidelying   Clam 10 reps    Clam Limitations x2; bilateral                      PT Short Term Goals - 10/13/20 0943       PT SHORT TERM Kurdziel #1   Title Pt will be I with HEP for basic back and mobilty exercises    Baseline cues required for LTR    Time 4    Period Weeks    Status On-going    Target Date 10/27/20      PT SHORT TERM Chizek #2   Title Pt will complete FOTO and understand results , potential to improve condition    Baseline 34%  with predicted 49%    Time 4    Period Weeks    Status Achieved      PT SHORT TERM Friar #3   Title Pt will spend less time in bed, 50% or less of her day to improve condition and overall health.    Baseline 10/13/20 reports getting out of bed for at least 2-3 hours at a time throughout the day and is walking more    Time 4    Period Weeks    Status Achieved    Target Date 10/27/20               PT Long Term Goals - 09/29/20 0808       PT LONG TERM Meddaugh #1   Title Pt will be independent with more advanced HE    Time 8    Period Weeks    Status New    Target Date 11/24/20      PT LONG TERM Rollins #2   Title Pt will be able to improve 5 x  sit to stand time to less than 25 sec with use of UEs    Baseline 35 sec ,used both hands    Time 8    Period Weeks    Status New    Target Date 11/24/20      PT LONG TERM Paster #3   Title Pt will be able to stand for up to 20 min in order to complete shopping, cooking without exacerbation of pain in back    Time 8    Period Weeks    Status New    Target Date 11/24/20      PT LONG TERM Portela #4   Title FOTO score TBA    Time 8    Period Weeks    Status New    Target Date 11/24/20      PT LONG TERM Zeller #5   Title Pt will be able to increase distance in 2 min walk test to > 200 feet with RPE < 15    Time 8    Period Weeks    Status New    Target Date 11/24/20  Plan - 10/13/20 0945     Clinical Impression Statement Patient tolerated session well today without reports of increased low back pain. She reports spending more time out of her bed during the day and walking more having met this short term functional [place]. Able to progress BLE strengthening in seated and supine with patient reporting more difficulty performing on the RLE compared to the LLE. She reported feeling overheated when transitioning from supine to sit that was resolved with rest break and cold towel to the face.    PT Next Visit Plan  update HEP, trunk flexibility , core/hip strengthening, consider calf raises in standing    PT Home Exercise Plan [facility]             Patient will benefit from skilled therapeutic intervention in order to improve the following deficits and impairments:  Abnormal gait, Cardiopulmonary status limiting activity, Decreased activity tolerance, Decreased strength, Increased fascial restricitons, Impaired UE functional use, Postural dysfunction, Pain, Impaired flexibility, Decreased range of motion, Obesity, Increased edema, Decreased balance, Decreased mobility, Decreased endurance, Difficulty walking, Hypomobility, Dizziness  Visit Diagnosis: Muscle weakness (generalized)  Difficulty in walking, not elsewhere classified  Chronic bilateral low back pain without sciatica  Other abnormalities of gait and mobility     Problem List Patient Active Problem List   Diagnosis Date Noted   Chronic back pain 08/18/2020   Chronic heart failure with preserved ejection fraction (HCC) 02/27/2020   Obesity, diabetes, and hypertension syndrome (HCC) 02/27/2020   Vitreomacular adhesion of both eyes 02/09/2020   Pseudophakia of both eyes 02/09/2020   Chronic diastolic CHF (congestive heart failure) (HCC) 03/12/2019   Acute respiratory failure with hypoxia (HCC) 03/09/2019   Hypernatremia 03/09/2019   Suspected COVID-19 virus infection 03/09/2019   Acute encephalopathy 03/09/2019   Right sided weakness 02/20/2019   Thyroid mass 02/27/2017   Chronic pain of right knee 05/24/2016   Effusion, right knee 04/28/2016   Presence of right artificial knee joint 04/28/2016   Abnormal LFTs    Decreased sensation    Paresthesia 06/12/2014   Obstruction of kidney 04/24/2014   Diabetes type 2, uncontrolled ([place]) 04/24/2014   Chronic anticoagulation 04/24/2014   Crack cocaine use 04/24/2014   Depression 04/24/2014   Fibromyalgia 04/24/2014   COPD (chronic obstructive pulmonary disease) ([place]) 04/24/2014    OSA (obstructive sleep apnea) 04/24/2014   Lupus anticoagulant positive 04/24/2014   Adrenal mass, right ([place]) 04/24/2014   Right kidney mass 04/24/2014   Hydronephrosis of right kidney 04/24/2014   Supratherapeutic INR 04/24/2014   Renal mass, right 04/24/2014   Right lower quadrant abdominal pain    Morbid obesity ([place]) 08/21/2013   Constipation 08/19/2013   COPD with acute exacerbation ([place]) 08/17/2013   Morbid obesity with BMI of 45.0-49.9, adult ([place]) 01/05/2012   Bilateral deep vein thromboses ([place]) 12/13/2011   Abdominal pain 11/05/2011   Hypokalemia 11/05/2011   Sciatica 11/05/2011   Diabetes mellitus type 2 in obese ([place]) 11/05/2011   HTN (hypertension) 11/05/2011   Gwendolyn Grant, PT, DPT, ATC 10/13/20 10:12 AM   [facility] [address] [place], [place], [address] Phone: [phone]   Fax:  [phone]  Name: [name] MRN: [id number] Date of Birth: 1955/12/28

## 2020-10-18 DIAGNOSIS — J449 Chronic obstructive pulmonary disease, unspecified: Secondary | ICD-10-CM | POA: Diagnosis not present

## 2020-10-20 ENCOUNTER — Encounter: Payer: Self-pay | Admitting: Physical Therapy

## 2020-10-20 ENCOUNTER — Ambulatory Visit: Payer: Medicare Other | Attending: Neurosurgery | Admitting: Physical Therapy

## 2020-10-20 ENCOUNTER — Other Ambulatory Visit: Payer: Self-pay

## 2020-10-20 DIAGNOSIS — M545 Low back pain, unspecified: Secondary | ICD-10-CM | POA: Diagnosis not present

## 2020-10-20 DIAGNOSIS — G8929 Other chronic pain: Secondary | ICD-10-CM | POA: Diagnosis not present

## 2020-10-20 DIAGNOSIS — M6281 Muscle weakness (generalized): Secondary | ICD-10-CM | POA: Insufficient documentation

## 2020-10-20 DIAGNOSIS — R262 Difficulty in walking, not elsewhere classified: Secondary | ICD-10-CM | POA: Diagnosis not present

## 2020-10-20 DIAGNOSIS — R2689 Other abnormalities of gait and mobility: Secondary | ICD-10-CM | POA: Diagnosis not present

## 2020-10-20 NOTE — Therapy (Signed)
[place], [place], [address] Phone: [phone]   Fax:  [phone]  Physical Therapy Treatment  Patient Details  Name: Andreana Klingerman Mckiernan MRN: [id number] Date of Birth: [birth year] Referring Provider (PT): Dr. [name]   Encounter Date: 10/20/2020   PT End of Session - 10/20/20 0720     Visit Number 4    Number of Visits 8    Date for PT Re-Evaluation 11/23/20    Authorization Type UHC MCR , MCD    Progress Note Due on Visit 10    PT Start Time 0715    PT Stop Time 0755    PT Time Calculation (min) 40 min             Past Medical History:  Diagnosis Date   Adrenal mass ([place]) 03/226   Benign   Arthritis    knees/multiple orthopedic conditons; lower back   Asthma    per pt   Clotting disorder (HCC)    +beta-2-glycoprotein IgA antibody   COPD (chronic obstructive pulmonary disease) ([place])    inhalers dependent on environment   Depression    Diabetes mellitus    120s usually fasting -  dx more than 10 yrs ago   Dizziness, nonspecific    DVT (deep venous thrombosis) (HCC)    Recurrent   Fibromyalgia    GERD (gastroesophageal reflux disease)    Heart murmur    History of blood transfusion    History of cocaine abuse ([place])    Remote history    Hypertension    takes meds daily   Hypothyroidism    Lupus Anticoagulant Positive    On home oxygen therapy    at night   Shortness of breath    exertion or lying flat   Sleep apnea    2l of oxygen at night (as of 12/6, she used to)    Past Surgical History:  Procedure Laterality Date   ABDOMINAL HYSTERECTOMY  1989   Fibroids   ANTERIOR LUMBAR FUSION  01/03/2012   Procedure: ANTERIOR LUMBAR FUSION 1 LEVEL;  Surgeon: Winfield Cunas, MD;  [facility];  Service: Neurosurgery;  Laterality: N/A;  Lumbar Four-Five Anterior Lumbar Interbody Fusion with Instrumentation   BACK SURGERY     cervical spine---disk disease   CARPAL  TUNNEL RELEASE     Bilateral   CESAREAN SECTION     CHOLECYSTECTOMY     CYSTOSCOPY/RETROGRADE/URETEROSCOPY/STONE EXTRACTION WITH BASKET Right 04/26/2014   Procedure: CYSTOSCOPY/ RIGHT RETROGRADE/ RIGHT URETEROSCOPY/URETERAL AND RENAL PELVIS BIOPSY;  Surgeon: Alexis Frock, MD;  [facility];  Service: Urology;  Laterality: Right;   gallstones removed     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   bilateral   right elbow surgery     THYROID LOBECTOMY Right 02/27/2017   THYROID LOBECTOMY Right 02/27/2017   Procedure: RIGHT THYROID LOBECTOMY;  Surgeon: Erroll Luna, MD;  Location: [facility];  Service: General;  Laterality: Right;   TUBAL LIGATION      There were no vitals filed for this visit.   Subjective Assessment - 10/20/20 0719     Subjective Back pain is not like it was. It is going down. I forgot to do my inhaler this morning.    Pertinent History diabetes, HTN, lumbar surgery, polypharmacy    Currently in Pain? Yes    Pain Score 5     Pain Location Back    Pain Orientation Lower    Pain Descriptors /  Indicators Aching    Pain Type Chronic pain    Aggravating Factors  standing, prolonged sitting    Pain Relieving Factors heat                               [facility] Adult PT Treatment/Exercise - 10/20/20 0001       Transfers   Five time sit to stand comments  12.5   hands on thighs , from mat     Ambulation/Gait   Ambulation Distance (Feet) 406 Feet   2 laps   Assistive device Straight cane    Gait Comments 2 MWT 320 feet      Lumbar Exercises: Stretches   Active Hamstring Stretch 60 seconds    Active Hamstring Stretch Limitations seated; bilateral    Lower Trunk Rotation Limitations 10 sec x 5 each way    Figure 4 Stretch Limitations push and pull 20 sec x 3 each      Lumbar Exercises: Aerobic   Nustep L5 LE only  x 5 minutes      Lumbar Exercises: Standing   Heel Raises 10 reps    Heel Raises Limitations 2 sets    Other Standing Lumbar Exercises  hip abduction 10 x 2      Lumbar Exercises: Seated   Sit to Stand 5 reps   2 set   Other Seated Lumbar Exercises pelvic tilt x 10   mod cues     Lumbar Exercises: Supine   Bent Knee Raise 10 reps    Bent Knee Raise Limitations with cues for abdominal draw in    Bridge Limitations x 10 with mod cues and partial ROM    Large Ball Abdominal Isometric 10 reps    Large Ball Abdominal Isometric Limitations 5 sec                      PT Short Term Goals - 10/20/20 0831       PT SHORT TERM Amey #1   Title Pt will be I with HEP for basic back and mobilty exercises    Baseline cues required for LTR    Time 4    Period Weeks    Status On-going      PT SHORT TERM Brodhead #2   Title Pt will complete FOTO and understand results , potential to improve condition    Baseline 34% with predicted 49%    Time 4    Period Weeks    Status Achieved    Target Date 10/27/20      PT SHORT TERM Maharaj #3   Title Pt will spend less time in bed, 50% or less of her day to improve condition and overall health.    Baseline 10/13/20 reports getting out of bed for at least 2-3 hours at a time throughout the day and is walking more    Time 4    Period Weeks    Status Achieved               PT Long Term Goals - 10/20/20 7628       PT LONG TERM Petrenko #1   Title Pt will be independent with more advanced HE    Time 8    Period Weeks    Status On-going      PT LONG TERM Heffley #2   Title Pt will be able to improve 5 x  sit to  stand time to less than 25 sec with use of UEs    Baseline 12.5 sec from mat table, without UE    Time 8    Period Weeks    Status On-going      PT LONG TERM Filippi #3   Title Pt will be able to stand for up to 20 min in order to complete shopping, cooking without exacerbation of pain in back    Time 8    Period Weeks    Status On-going      PT LONG TERM Behrman #4   Title FOTO score TBA    Baseline predicted 49%    Time 8    Period Weeks    Status On-going       PT LONG TERM Lynn #5   Title Pt will be able to increase distance in 2 min walk test to > 200 feet with RPE < 15    Baseline 320 feet, RPE not assesses    Time 8    Period Weeks    Status Partially Met                   Plan - 10/20/20 0846     Clinical Impression Statement Delita reports she is working on the pelvic tilit however still has difficulty. Worked on recruting abdominals and gluteals using physiobal for feedback in supine. She needs less cues for pelvic tilit in seated. Able to begin marcing with abdominal draw in. Her 5 X STS has improved as well as her 2MWT. She is progressing toward her LTGs.    PT Next Visit Plan update HEP, trunk flexibility , core/hip strengthening, continue closed chain    PT Home Exercise Plan [id number]    Consulted and Agree with Plan of Care Patient             Patient will benefit from skilled therapeutic intervention in order to improve the following deficits and impairments:     Visit Diagnosis: Muscle weakness (generalized)  Difficulty in walking, not elsewhere classified  Chronic bilateral low back pain without sciatica  Other abnormalities of gait and mobility     Problem List Patient Active Problem List   Diagnosis Date Noted   Chronic back pain 08/18/2020   Chronic heart failure with preserved ejection fraction ([place]) 02/27/2020   Obesity, diabetes, and hypertension syndrome ([place]) 02/27/2020   Vitreomacular adhesion of both eyes 02/09/2020   Pseudophakia of both eyes 02/09/2020   Chronic diastolic CHF (congestive heart failure) ([place]) 03/12/2019   Acute respiratory failure with hypoxia (HCC) 03/09/2019   Hypernatremia 03/09/2019   Suspected COVID-19 virus infection 03/09/2019   Acute encephalopathy 03/09/2019   Right sided weakness 02/20/2019   Thyroid mass 02/27/2017   Chronic pain of right knee 05/24/2016   Effusion, right knee 04/28/2016   Presence of right artificial knee joint 04/28/2016   Abnormal LFTs     Decreased sensation    Paresthesia 06/12/2014   Obstruction of kidney 04/24/2014   Diabetes type 2, uncontrolled ([place]) 04/24/2014   Chronic anticoagulation 04/24/2014   Crack cocaine use 04/24/2014   Depression 04/24/2014   Fibromyalgia 04/24/2014   COPD (chronic obstructive pulmonary disease) ([place]) 04/24/2014   OSA (obstructive sleep apnea) 04/24/2014   Lupus anticoagulant positive 04/24/2014   Adrenal mass, right ([place]) 04/24/2014   Right kidney mass 04/24/2014   Hydronephrosis of right kidney 04/24/2014   Supratherapeutic INR 04/24/2014   Renal mass, right 04/24/2014   Right lower quadrant abdominal pain  Morbid obesity ([place]) 08/21/2013   Constipation 08/19/2013   COPD with acute exacerbation ([place]) 08/17/2013   Morbid obesity with BMI of 45.0-49.9, adult ([place]) 01/05/2012   Bilateral deep vein thromboses ([place]) 12/13/2011   Abdominal pain 11/05/2011   Hypokalemia 11/05/2011   Sciatica 11/05/2011   Diabetes mellitus type 2 in obese ([place]) 11/05/2011   HTN (hypertension) 11/05/2011    Dorene Ar, PTA 10/20/2020, 8:48 AM  [facility] [address] [place], [place], [address] Phone: [phone]   Fax:  [phone]  Name: Stevi Hollinshead Mehl MRN: [id number] Date of Birth: 1956-01-27

## 2020-10-21 ENCOUNTER — Other Ambulatory Visit: Payer: Self-pay | Admitting: Oncology

## 2020-10-21 ENCOUNTER — Telehealth: Payer: Self-pay | Admitting: Family Medicine

## 2020-10-21 NOTE — Telephone Encounter (Signed)
Patient is needing a refill of cloNIDine (CATAPRES) 0.1 MG tablet to be sent to [facility], [address] AT [place].   Patient says that she no longer sees the doctor who originally prescribes this medication and was wanting Dr. Volanda Napoleon to send in a refill, since she is almost out.  Please advise.

## 2020-10-21 NOTE — Patient Instructions (Signed)
Hi [name],  It was great to get to meet you in person! Below is a summary of some of the topics we discussed.   Please reach out to me if you have any questions or need anything before our follow up!  Best, Maddie  [name], PharmD, [facility] [place]   Visit Information   Goals Addressed   None    Patient Care Plan: CCM Pharmacy Care Plan     Problem Identified: Problem: Hypertension, Hyperlipidemia, Diabetes, Heart Failure, COPD, and Depression      Long-Range [place]rell: Patient-Specific Speas   Start Date: 10/07/2020  Expected End Date: 10/07/2021  This Visit's Progress: On track  Priority: High  Note:   Current Barriers:  Unable to independently monitor therapeutic efficacy Unable to achieve control of diabetes or cholesterol  Unable to maintain control of blood pressure  Pharmacist Clinical Haggar(s):  Patient will achieve adherence to monitoring guidelines and medication adherence to achieve therapeutic efficacy achieve control of diabetes and cholesterol as evidenced by next A1c and lipid panel maintain control of blood pressure as evidenced by home and office BP readings  through collaboration with PharmD and provider.   Interventions: 1:1 collaboration with Billie Ruddy, MD regarding development and update of comprehensive plan of care as evidenced by provider attestation and co-signature Inter-disciplinary care team collaboration (see longitudinal plan of care) Comprehensive medication review performed; medication list updated in electronic medical record  Hypertension   (BP Warzecha <130/80) -Uncontrolled -Current treatment: Clonidine 0.1 mg 1 tablet twice daily Cartia XT 240 mg -Medications previously tried: amlodipine (taking diltiazem), lisinopril (throat swelling) -Current home readings: 150-160 before medications (bought a wrist cuff) -Current dietary habits: uses black pepper; no salt -Current  exercise habits: not consistently -Denies hypotensive/hypertensive symptoms -Educated on BP goals and benefits of medications for prevention of heart attack, stroke and kidney damage; Exercise Printy of 150 minutes per week; Importance of home blood pressure monitoring; Proper BP monitoring technique; -Counseled to monitor BP at home twice weekly, document, and provide log at future appointments -Counseled on diet and exercise extensively Recommended to continue current medication  Hyperlipidemia: (LDL [place] < 70) -Uncontrolled -Current treatment: Pravastatin 40 mg 1 tablet every evening  -Medications previously tried: none  -Current dietary patterns: chicken or pork chop for meat -Current exercise habits: not consistently; some PT exercises -Educated on Cholesterol goals;  Benefits of statin for ASCVD risk reduction; Importance of limiting foods high in cholesterol; Exercise Mahmood of 150 minutes per week; -Counseled on diet and exercise extensively Recommended increasing pravastatin to target Krell LDL  Diabetes (A1c Mal[place] <7%) -Uncontrolled -Current medications: Invokana 100 mg 1 tablet daily Metformin 500 mg 1 tablet at bedtime -Medications previously tried: none  -Current home glucose readings fasting glucose: 140, 280, 121, 160 (2 times a day)  - 160s at bedtime post prandial glucose: not checking -Denies hypoglycemic/hyperglycemic symptoms -Current meal patterns:  breakfast: no breakfast except on Thursdays from church (eggs, toast, ham)  lunch: sandwich, hot dogs, hamburgers dinner: cabbage, boiled chicken, salads snacks: fruit drinks: ginger ale - one a day; water; backed off pineapple-orange soda  -Current exercise: PT exercises some -Educated on A1c and blood sugar goals; Exercise Brinley of 150 minutes per week; Benefits of routine self-monitoring of blood sugar; Carbohydrate counting and/or plate method -Counseled to check feet daily and get yearly eye  exams -Counseled on diet and exercise extensively Recommended to continue current medication Recommended switching to Jardiance for HF benefit.  Heart Failure (Be[place]umier: manage symptoms and prevent exacerbations) -Controlled -Last ejection fraction: 60-65% (Date: 05/04/20) -HF type: Diastolic -NYHA Class: II (slight limitation of activity) -AHA HF Stage: C (Heart disease and symptoms present) -Current treatment: Furosemide 40 mg 1 tablet daily -Medications previously tried: lisinopril -Current home BP/HR readings: 150-160s at home -Current dietary habits: limits salt intake -Current exercise habits: limited exercise -Educated on Importance of weighing daily; if you gain more than 3 pounds in one day or 5 pounds in one week, call cardiologist. Proper diuretic administration and potassium supplementation Importance of blood pressure control -Counseled on diet and exercise extensively Recommended to continue current medication  COPD (De[place]arnette: control symptoms and prevent exacerbations) -Controlled -Current treatment  Symbicort 2 puffs twice a day Albuterol nebulizer as needed Albuterol HFA as needed -Medications previously tried: unknown  -Gold Grade: Gold 2 (FEV1 50-79%) -Current COPD Classification:  B (high sx, <2 exacerbations/yr) -MMRC/CAT score: 1 -Pulmonary function testing: 07/2019 -Exacerbations requiring treatment in last 6 months: none -Patient reports consistent use of maintenance inhaler -Frequency of rescue inhaler use: rarely -Counseled on Proper inhaler technique; When to use rescue inhaler Differences between maintenance and rescue inhalers -Counseled on diet and exercise extensively Recommended to continue current medication  Depression/Anxiety ([place]: minimize symptoms) -Controlled -Current treatment: Rexulti 3 mg 1 tablet daily Duloxetine 60 mg  1 capsule daily Duloxetine 20 mg 1 capsule daily -Medications previously tried/failed: unknown -PHQ9: 0 -GAD7:  14 -Educated on Benefits of medication for symptom control Benefits of cognitive-behavioral therapy with or without medication -Recommended to continue current medication  Pain/fibromyalgia (Skerritt: minimize pain) -Uncontrolled -Current treatment  Diclofenac gel 1% as needed Duloxetine 20 mg and 60 mg daily Gabapentin 300 mg 1 capsule at bedtime -Medications previously tried: unknown  -Recommended to continue current medication  Allergic rhinitis (Sylvia: minimize symptoms) -Controlled -Current treatment  Flonase nasal spray as needed Levocetirizine 5 mg 1 tablet at bedtime -Medications previously tried: none  -Recommended to continue current medication  Seizures ([place]: prevent seizures) -Controlled -Current treatment  Zonisamide 100 mg 1 capsule at breakfast and 2 capsules at bedtime -Medications previously tried: none  -Recommended to continue current medication  Bilateral DVT ([place]: prevent blood clots) -Controlled -Current treatment  Warfarin 5 mg or 10 mg tablets as directed -Medications previously tried: none  -Counseled on diet and exercise extensively Recommended to continue current medication  GERD ([place]: minimize heartburn/acid reflux) -Controlled -Current treatment  Omeprazole 20 mg 1 capsule twice daily - currently taking once daily -Medications previously tried: none  -Counseled on long term risks of taking PPIs. Recommended tapering and stopping omeprazole.   Health Maintenance -Vaccine gaps: shingrix, Prevnar 20, COVID booster -Current therapy:  Meclizine 25 mg as needed Linzess 145 mcg 1 capsule every other day Metoclopramide 10 mg as needed -Educated on Cost vs benefit of each product must be carefully weighed by individual consumer -Patient is satisfied with current therapy and denies issues -Recommended to continue current medication  Patient Goals/Self-Care Activities Patient will:  - take medications as prescribed focus on medication  adherence by using adherence packaging check blood pressure twice weekly, document, and provide at future appointments target a minimum of 150 minutes of moderate intensity exercise weekly  Follow Up Plan: The care management team will reach out to the patient again over the next 30 days.       Ms. Temme was given information about Chronic Care Management services today including:  CCM service includes personalized support from designated clinical staff supervised by her  physician, including individualized plan of care and coordination with other care providers 24/7 contact phone numbers for assistance for urgent and routine care needs. Standard insurance, coinsurance, copays and deductibles apply for chronic care management only during months in which we provide at least 20 minutes of these services. Most insurances cover these services at 100%, however patients may be responsible for any copay, coinsurance and/or deductible if applicable. This service may help you avoid the need for more expensive face-to-face services. Only one practitioner may furnish and bill the service in a calendar month. The patient may stop CCM services at any time (effective at the end of the month) by phone call to the office staff.  Patient agreed to services and verbal consent obtained.   Patient verbalizes understanding of instructions provided today and agrees to view in [place].  The pharmacy team will reach out to the patient again over the next 30 days.   Viona Gilmore, [facility]

## 2020-10-22 ENCOUNTER — Telehealth: Payer: Self-pay | Admitting: Internal Medicine

## 2020-10-22 ENCOUNTER — Telehealth: Payer: Self-pay | Admitting: *Deleted

## 2020-10-22 ENCOUNTER — Telehealth: Payer: Self-pay | Admitting: Pharmacist

## 2020-10-22 ENCOUNTER — Other Ambulatory Visit: Payer: Self-pay | Admitting: Family Medicine

## 2020-10-22 MED ORDER — FUROSEMIDE 40 MG PO TABS: 40.0000 mg | ORAL_TABLET | Freq: Every day | ORAL | 3 refills | Status: DC

## 2020-10-22 MED ORDER — CANAGLIFLOZIN 100 MG PO TABS: 100.0000 mg | ORAL_TABLET | Freq: Every day | ORAL | 3 refills | Status: DC

## 2020-10-22 NOTE — Telephone Encounter (Signed)
*  STAT* If patient is at the pharmacy, call can be transferred to refill team.   1. Which medications need to be refilled? (please list name of each medication and dose if known)  canagliflozin (INVOKANA) 100 MG TABS tablet furosemide (LASIX) 40 MG tablet  2. Which pharmacy/location (including street and city if local pharmacy) is medication to be sent to? [facility] - [place], [place] - [place] [address]  3. Do they need a 30 day or 90 day supply? 90 day supply    Pt is changing pharmacies

## 2020-10-22 NOTE — Telephone Encounter (Signed)
Refills for Furosemide and Invokana has been sent to [facility], per pt request.

## 2020-10-22 NOTE — Chronic Care Management (AMB) (Addendum)
    Chronic Care Management Pharmacy Assistant   Name: [name]  MRN: [id number] DOB: [birth year]   Chronic Care Management    Outreach Note   10/22/2020  Name: [name]   MRN: [id number]       DOB:08-Nov-1955   Referred by: Billie Ruddy, MD  Reason for referral: Chronic care management  Reviewed chart for medication changes ahead of medication coordination call.  BP Readings from Last 3 Encounters:  09/15/20 140/90  08/18/20 (!) 175/106  08/11/20 126/90    Lab Results  Component Value Date   HGBA1C 7.5 (A) 09/15/2020     Verbal consent obtained for [facility] enhanced pharmacy services (medication synchronization, adherence packaging, delivery coordination). A medication sync plan was created to allow patient to get all medications delivered once every 30 to 90 days per patient preference. Patient understands they have freedom to choose pharmacy and clinical pharmacist will coordinate care between all prescribers and [facility].  Patient requested to obtain medications through Adherence Packaging  30 Days   Med Sync Plan:  Medication Name CPP Transfer (put Dr.'s name) Timing Last Fill Date & Day Supply Format: MM/DD/YY - DS (If last fill/DS unavailable, list pt.'s quantity on hand) Anticipated next due date  Format: MM/DD/YY      BB  B  L  EM  BT    Metformin '500mg'$   x      1 #65 12/11/20  Linzess 135mg  x       #16 every other day 11/08/20  Pravastatin '40mg'$   x     1  #36 11/12/20  Clonidine 0.'1mg'$     1    1 #40 10/27/20  Invokana '100mg'$  x  1     #69 12/15/20  Cartia XT '240mg'$   x  1     #84 12/30/20  Furosemide '40mg'$  x  1     #76 12/22/20  Duloxetine '20mg'$    1     #79 12/25/20  Duloxetine '60mg'$        1 #36 11/12/20  Levocetirizine '5mg'$        1 #70 12/16/20  Zonisamide '100mg'$    1    2 #150 11/26/20  Warfarin '5mg'$         #9 (Directions change) Will call when needed  Warfarin '10mg'$         #9 (Directions change) Will call when needed  Potassium 10MEQ  x       2 #88 11/20/20  Gabapentin '300mg'$   x      1 #37 11/13/20  Rexulti '3mg'$        1 #39 11/15/20  Omeprazole '20mg'$  x  1   1  #173 01/02/21               Prescriptions requested from PCP and specialists.    [place] Clinical Pharmacist Assistant ([phone]

## 2020-10-22 NOTE — Telephone Encounter (Signed)
Called patient regarding refill request for Warfarin 5 mg. Has not had PT/INR since April and needs this checked next week. She agrees to Monday at 1045. Refilled x 1

## 2020-10-25 ENCOUNTER — Inpatient Hospital Stay: Payer: Medicare Other | Attending: Oncology

## 2020-10-25 ENCOUNTER — Telehealth: Payer: Self-pay | Admitting: Internal Medicine

## 2020-10-25 ENCOUNTER — Other Ambulatory Visit: Payer: Self-pay

## 2020-10-25 DIAGNOSIS — D6862 Lupus anticoagulant syndrome: Secondary | ICD-10-CM | POA: Insufficient documentation

## 2020-10-25 DIAGNOSIS — Z7901 Long term (current) use of anticoagulants: Secondary | ICD-10-CM

## 2020-10-25 DIAGNOSIS — Z86718 Personal history of other venous thrombosis and embolism: Secondary | ICD-10-CM | POA: Insufficient documentation

## 2020-10-25 DIAGNOSIS — I82403 Acute embolism and thrombosis of unspecified deep veins of lower extremity, bilateral: Secondary | ICD-10-CM

## 2020-10-25 LAB — PROTIME-INR
INR: 1.8 — ABNORMAL HIGH (ref 0.8–1.2)
Prothrombin Time: 21.2 seconds — ABNORMAL HIGH (ref 11.4–15.2)

## 2020-10-25 MED ORDER — FUROSEMIDE 40 MG PO TABS: 40.0000 mg | ORAL_TABLET | Freq: Every day | ORAL | 3 refills | Status: DC

## 2020-10-25 MED ORDER — CANAGLIFLOZIN 100 MG PO TABS: 100.0000 mg | ORAL_TABLET | Freq: Every day | ORAL | 3 refills | Status: DC

## 2020-10-25 NOTE — Telephone Encounter (Signed)
Spoke with patient to verify pharmacy since refills were sent to [facility] for furosemide and Invokana on 8/5. Patient states she did not pick up those medications at [facility] over the past few days. She states her PCP was helping her find a pharmacy that will put her medication into pill packs for her. I advised that I will send these prescriptions to [facility]. She thanked me for the call.

## 2020-10-25 NOTE — Telephone Encounter (Signed)
Pt c/o medication issue:  1. Name of Medication:  furosemide (LASIX) 40 MG tablet  canagliflozin (INVOKANA) 100 MG TABS tablet  2. How are you currently taking this medication (dosage and times per day)?   Furosemide (LASIX) Take 1 tablet (40 mg total) by mouth daily  canagliflozin (INVOKANA) Take 1 tablet (100 mg total) by mouth daily before breakfast  3. Are you having a reaction (difficulty breathing--STAT)? NO   4. What is your medication issue?   [facility] [place], [place] - [address]   Phone:  [phone]  Fax:  [phone]

## 2020-10-27 ENCOUNTER — Ambulatory Visit: Payer: Medicare Other | Admitting: Physical Therapy

## 2020-10-27 NOTE — Telephone Encounter (Signed)
Patient called today stating that she is wanting the prescription for cloNIDine (CATAPRES) 0.1 MG tablet to be sent to [facility], [address] AT [place].  Patient states that she was given a call stating that the medication was sent in to the pharmacy. Patient says she called pharmacy and they state they have not received anything.  Please advise.

## 2020-10-27 NOTE — Telephone Encounter (Signed)
Please send to [facility] as patient is trying to transition to adherence packaging.

## 2020-11-03 ENCOUNTER — Ambulatory Visit: Payer: Medicare Other | Admitting: Physical Therapy

## 2020-11-03 ENCOUNTER — Telehealth: Payer: Self-pay

## 2020-11-03 ENCOUNTER — Other Ambulatory Visit: Payer: Self-pay

## 2020-11-03 ENCOUNTER — Encounter: Payer: Self-pay | Admitting: Registered"

## 2020-11-03 ENCOUNTER — Encounter: Payer: Medicare Other | Attending: Family Medicine | Admitting: Registered"

## 2020-11-03 DIAGNOSIS — E119 Type 2 diabetes mellitus without complications: Secondary | ICD-10-CM | POA: Diagnosis not present

## 2020-11-03 NOTE — Telephone Encounter (Signed)
-----   Message from Ladell Pier, MD sent at 10/25/2020  1:26 PM EDT ----- Please call patient, PT/INR is slightly below the therapeutic range, continue Coumadin at the same dose, repeat PT/INR in 1 month, if still low we will increase the dose slightly

## 2020-11-03 NOTE — Progress Notes (Signed)
Diabetes Self-Management Education  Visit Type: First/Initial  Appt. Start Time: 8:10 Appt. End Time: 9:18  11/03/2020  [name], identified by name and date of birth, is a 65 y.o. female with a diagnosis of Diabetes: Type 2.   ASSESSMENT  Pt states she checks BS 1-2x/day FBS (122-123) and sometimes will check in the evenings (133-135). States she has upcoming appointments for dental and eye exams within the next 2 months. States at night she has moments where she can't enough while eating. States she tries to keep fruit around. Will eat fruit and/or fish at night.   States her daughter helps her when going to grocery store.  Pt expectations: wants to know more about diabetes  There were no vitals taken for this visit. There is no height or weight on file to calculate BMI.   Diabetes Self-Management Education - 11/03/20 0823       Visit Information   Visit Type First/Initial      Initial Visit   Diabetes Type Type 2    Are you currently following a meal plan? No    Are you taking your medications as prescribed? Yes    Date Diagnosed 2015-2016; 6-7 years ago      Health Coping   How would you rate your overall health? Fair      Psychosocial Assessment   Patient Belief/Attitude about Diabetes Other (comment)   ok   Self-care barriers None    Self-management support Family    Other persons present Patient    Special Needs None    Preferred Learning Style No preference indicated    Learning Readiness Ready    How often do you need to have someone help you when you read instructions, pamphlets, or other written materials from your doctor or pharmacy? 3 - Sometimes    What is the last grade level you completed in school? 9th grade      Complications   Last HgB A1C per patient/outside source 7.5 %    How often do you check your blood sugar? 1-2 times/day    Fasting Blood glucose range (mg/dL) 70-129    Postprandial Blood glucose range (mg/dL) 130-179    Number of  hypoglycemic episodes per month 2    Can you tell when your blood sugar is low? No    Number of hyperglycemic episodes per week 0    Have you had a dilated eye exam in the past 12 months? No    Have you had a dental exam in the past 12 months? No    Are you checking your feet? Yes    How many days per week are you checking your feet? 7      Dietary Intake   Breakfast oatmeal + water    Lunch fruit (peaches or orange)    Dinner fried okra + 2 pieces fried fish (used olive oil) + water    Snack (evening) 1 piece of fried fish    Beverage(s) water (8*16 oz; 128 oz)      Exercise   Exercise Type Other (comment);ADL's   physical therapy back exercises   How many days per week to you exercise? 1    How many minutes per day do you exercise? 45    Total minutes per week of exercise 45      Patient Education   Previous Diabetes Education No    Disease state  Definition of diabetes, type 1 and 2, and the diagnosis of diabetes;Factors  that contribute to the development of diabetes    Nutrition management  Role of diet in the treatment of diabetes and the relationship between the three main macronutrients and blood glucose level;Food label reading, portion sizes and measuring food.    Physical activity and exercise  Role of exercise on diabetes management, blood pressure control and cardiac health.    Monitoring Purpose and frequency of SMBG.;Yearly dilated eye exam;Daily foot exams;Identified appropriate SMBG and/or A1C goals.;Interpreting lab values - A1C, lipid, urine microalbumina.    Acute complications Taught treatment of hypoglycemia - the 15 rule.;Discussed and identified patients' treatment of hyperglycemia.    Chronic complications Reviewed with patient heart disease, higher risk of, and prevention;Lipid levels, blood glucose control and heart disease;Dental care    Psychosocial adjustment Role of stress on diabetes      Individualized Goals (developed by patient)   Nutrition Follow  meal plan discussed    Physical Activity Exercise 1-2 times per week;15 minutes per day    Medications take my medication as prescribed    Monitoring  test my blood glucose as discussed    Reducing Risk do foot checks daily;treat hypoglycemia with 15 grams of carbs if blood glucose less than '70mg'$ /dL;examine blood glucose patterns      Post-Education Assessment   Patient understands the diabetes disease and treatment process. Demonstrates understanding / competency    Patient understands incorporating nutritional management into lifestyle. Demonstrates understanding / competency    Patient undertands incorporating physical activity into lifestyle. Demonstrates understanding / competency    Patient understands using medications safely. Needs Review    Patient understands monitoring blood glucose, interpreting and using results Demonstrates understanding / competency    Patient understands prevention, detection, and treatment of acute complications. Demonstrates understanding / competency    Patient understands prevention, detection, and treatment of chronic complications. Needs Review    Patient understands how to develop strategies to address psychosocial issues. Demonstrates understanding / competency    Patient understands how to develop strategies to promote health/change behavior. Demonstrates understanding / competency      Outcomes   Expected Outcomes Demonstrated interest in learning. Expect positive outcomes    Future DMSE 4-6 wks    Program Status Not Completed             Individualized Plan for Diabetes Self-Management Training:   Learning Objective:  Patient will have a greater understanding of diabetes self-management. Patient education plan is to attend individual and/or group sessions per assessed needs and concerns.   Plan:   Patient Instructions  Goals: Follow Diabetes Meal Plan as instructed Eat 3 meals and 1-2 snacks, every 3-5 hrs Aim to have 1/2 a plate of  non-starchy vegetables + 1/4 plate of carbohydrates + 1/4 plate of lean protein.  Add lean protein foods to meals/snacks Examples include: peanut butter crackers, cheese and crackers, trail mix, or fruit and nuts Monitor glucose levels as instructed by your doctor Increase physical activity to at least 20 min, 2 days/week  Bring food record and glucose log to your next nutrition visit   Expected Outcomes:  Demonstrated interest in learning. Expect positive outcomes  Education material provided: ADA - How to Thrive: A Guide for Your Journey with Diabetes  If problems or questions, patient to contact team via:  Phone and Email  Future DSME appointment: 4-6 wks

## 2020-11-03 NOTE — Telephone Encounter (Signed)
TC to Pt informed of slightly low therapeutic range for coumadin level. Informed Pt to repeat same dose and a repeat lab will be done on next visit and per Dr Benay Spice if result still low he will increase the dose slightly. Pt verbalized understanding.

## 2020-11-03 NOTE — Patient Instructions (Signed)
Goals: Follow Diabetes Meal Plan as instructed Eat 3 meals and 1-2 snacks, every 3-5 hrs Aim to have 1/2 a plate of non-starchy vegetables + 1/4 plate of carbohydrates + 1/4 plate of lean protein.  Add lean protein foods to meals/snacks Examples include: peanut butter crackers, cheese and crackers, trail mix, or fruit and nuts Monitor glucose levels as instructed by your doctor Increase physical activity to at least 20 min, 2 days/week  Bring food record and glucose log to your next nutrition visit

## 2020-11-04 NOTE — Telephone Encounter (Signed)
Ok

## 2020-11-05 ENCOUNTER — Other Ambulatory Visit: Payer: Self-pay

## 2020-11-05 DIAGNOSIS — K219 Gastro-esophageal reflux disease without esophagitis: Secondary | ICD-10-CM

## 2020-11-05 DIAGNOSIS — E876 Hypokalemia: Secondary | ICD-10-CM

## 2020-11-05 DIAGNOSIS — E1169 Type 2 diabetes mellitus with other specified complication: Secondary | ICD-10-CM

## 2020-11-05 DIAGNOSIS — I10 Essential (primary) hypertension: Secondary | ICD-10-CM

## 2020-11-05 DIAGNOSIS — J302 Other seasonal allergic rhinitis: Secondary | ICD-10-CM

## 2020-11-05 DIAGNOSIS — I5032 Chronic diastolic (congestive) heart failure: Secondary | ICD-10-CM

## 2020-11-05 DIAGNOSIS — M797 Fibromyalgia: Secondary | ICD-10-CM

## 2020-11-05 MED ORDER — POTASSIUM CHLORIDE ER 10 MEQ PO CPCR: ORAL_CAPSULE | ORAL | 2 refills | Status: DC

## 2020-11-05 MED ORDER — PRAVASTATIN SODIUM 40 MG PO TABS: ORAL_TABLET | ORAL | 3 refills | Status: DC

## 2020-11-05 MED ORDER — GABAPENTIN 300 MG PO CAPS: 300.0000 mg | ORAL_CAPSULE | Freq: Every day | ORAL | 3 refills | Status: DC

## 2020-11-05 MED ORDER — CLONIDINE HCL 0.1 MG PO TABS: 0.1000 mg | ORAL_TABLET | Freq: Two times a day (BID) | ORAL | 2 refills | Status: DC

## 2020-11-05 MED ORDER — METFORMIN HCL 500 MG PO TABS: 500.0000 mg | ORAL_TABLET | Freq: Every day | ORAL | 1 refills | Status: DC

## 2020-11-05 MED ORDER — OMEPRAZOLE 20 MG PO CPDR
20.0000 mg | DELAYED_RELEASE_CAPSULE | Freq: Two times a day (BID) | ORAL | 1 refills | Status: DC
Start: 2020-11-05 — End: 2021-06-20

## 2020-11-05 MED ORDER — LEVOCETIRIZINE DIHYDROCHLORIDE 5 MG PO TABS: 5.0000 mg | ORAL_TABLET | Freq: Every day | ORAL | 1 refills | Status: DC

## 2020-11-05 MED ORDER — CARTIA XT 240 MG PO CP24: 240.0000 mg | ORAL_CAPSULE | Freq: Every day | ORAL | 3 refills | Status: DC

## 2020-11-08 DIAGNOSIS — I10 Essential (primary) hypertension: Secondary | ICD-10-CM | POA: Diagnosis not present

## 2020-11-08 DIAGNOSIS — M4726 Other spondylosis with radiculopathy, lumbar region: Secondary | ICD-10-CM | POA: Diagnosis not present

## 2020-11-10 ENCOUNTER — Other Ambulatory Visit: Payer: Self-pay

## 2020-11-10 ENCOUNTER — Encounter: Payer: Self-pay | Admitting: Physical Therapy

## 2020-11-10 ENCOUNTER — Ambulatory Visit: Payer: Medicare Other | Admitting: Physical Therapy

## 2020-11-10 VITALS — BP 158/95 | HR 107

## 2020-11-10 DIAGNOSIS — R2689 Other abnormalities of gait and mobility: Secondary | ICD-10-CM

## 2020-11-10 DIAGNOSIS — M6281 Muscle weakness (generalized): Secondary | ICD-10-CM | POA: Diagnosis not present

## 2020-11-10 DIAGNOSIS — R262 Difficulty in walking, not elsewhere classified: Secondary | ICD-10-CM

## 2020-11-10 DIAGNOSIS — M545 Low back pain, unspecified: Secondary | ICD-10-CM | POA: Diagnosis not present

## 2020-11-10 DIAGNOSIS — G8929 Other chronic pain: Secondary | ICD-10-CM | POA: Diagnosis not present

## 2020-11-10 NOTE — Therapy (Signed)
[place], [place], [address] Phone: [phone]   Fax:  [phone]  Physical Therapy Treatment  Patient Details  Name: Rosellen Lichtenberger Gillen MRN: [id number] Date of Birth: 08/10/1955 Referring Provider (PT): Dr. [name]   Encounter Date: 11/10/2020   PT End of Session - 11/10/20 0720     Visit Number 5    Number of Visits 8    Date for PT Re-Evaluation 11/23/20    Authorization Type UHC MCR , MCD    Progress Note Due on Visit 10    PT Start Time 0715    PT Stop Time 0800    PT Time Calculation (min) 45 min             Past Medical History:  Diagnosis Date   Adrenal mass ([place]) 03/226   Benign   Arthritis    knees/multiple orthopedic conditons; lower back   Asthma    per pt   Clotting disorder (HCC)    +beta-2-glycoprotein IgA antibody   COPD (chronic obstructive pulmonary disease) (HCC)    inhalers dependent on environment   Depression    Diabetes mellitus    120s usually fasting -  dx more than 10 yrs ago   Dizziness, nonspecific    DVT (deep venous thrombosis) (HCC)    Recurrent   Fibromyalgia    GERD (gastroesophageal reflux disease)    Heart murmur    History of blood transfusion    History of cocaine abuse ([place])    Remote history    Hyperlipidemia    Hypertension    takes meds daily   Hypothyroidism    Lupus Anticoagulant Positive    On home oxygen therapy    at night   Shortness of breath    exertion or lying flat   Sleep apnea    2l of oxygen at night (as of 12/6, she used to)    Past Surgical History:  Procedure Laterality Date   ABDOMINAL HYSTERECTOMY  1989   Fibroids   ANTERIOR LUMBAR FUSION  01/03/2012   Procedure: ANTERIOR LUMBAR FUSION 1 LEVEL;  Surgeon: Winfield Cunas, MD;  Location: [facility];  Service: Neurosurgery;  Laterality: N/A;  Lumbar Four-Five Anterior Lumbar Interbody Fusion with Instrumentation   BACK SURGERY     cervical spine---disk  disease   CARPAL TUNNEL RELEASE     Bilateral   CESAREAN SECTION     CHOLECYSTECTOMY     CYSTOSCOPY/RETROGRADE/URETEROSCOPY/STONE EXTRACTION WITH BASKET Right 04/26/2014   Procedure: CYSTOSCOPY/ RIGHT RETROGRADE/ RIGHT URETEROSCOPY/URETERAL AND RENAL PELVIS BIOPSY;  Surgeon: Alexis Frock, MD;  Location: [facility];  Service: Urology;  Laterality: Right;   gallstones removed     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   bilateral   right elbow surgery     THYROID LOBECTOMY Right 02/27/2017   THYROID LOBECTOMY Right 02/27/2017   Procedure: RIGHT THYROID LOBECTOMY;  Surgeon: Erroll Luna, MD;  Location: [facility];  Service: General;  Laterality: Right;   TUBAL LIGATION      Vitals:   11/10/20 0725 11/10/20 0729  BP: (!) 158/95   Pulse: 81 (!) 107  SpO2: 96% 95%     Subjective Assessment - 11/10/20 0716     Subjective I have been feeling bad at home. I have been feeling tired at home and waking up with a headache. They finally got my BP meds and my fibromyalgia meds back on track. I feel pretty good this morning. I have  been able to be up on my feet cooking and cleaning for 25 minutes at a time and not spending as much time in the bed. She reports she can tolerate 15-20 minute before pain exacerbation up to 10/10 requiring rest and OTC meds.    Pertinent History diabetes, HTN, lumbar surgery, polypharmacy    Limitations Sitting;House hold activities;Lifting;Standing;Walking    Diagnostic tests Chronic facet hypertrophy and right-greater-than-left foraminal  narrowing at L5-S1 which may affect the exiting right L5 nerve root.Stable mild multifactorial spinal stenosis and mild right  foraminal narrowing at L3-4.    Patient Stated Goals Patient would like to be able to get this back right. Less pain . Lose more weight.    Currently in Pain? No/denies                [facility] PT Assessment - 11/10/20 0001       Observation/Other Assessments   Focus on Therapeutic Outcomes (FOTO)  60%                            [facility] Adult PT Treatment/Exercise - 11/10/20 0001       Transfers   Five time sit to stand comments  10.7   ligth hands on thighs, from mat.     Ambulation/Gait   Gait Comments 2 MWT 303 feet, RPE 4-5//10, BORG  14-15/10      Lumbar Exercises: Standing   Heel Raises 10 reps    Heel Raises Limitations 2 sets    Other Standing Lumbar Exercises hip flexion 10 x 2    Other Standing Lumbar Exercises hip abduction 10 x 2      Lumbar Exercises: Seated   Sit to Stand 5 reps                      PT Short Term Goals - 10/20/20 0831       PT SHORT TERM [place] #1   Title Pt will be I with HEP for basic back and mobilty exercises    Baseline cues required for LTR    Time 4    Period Weeks    Status On-going      PT SHORT TERM [place] #2   Title Pt will complete FOTO and understand results , potential to improve condition    Baseline 34% with predicted 49%    Time 4    Period Weeks    Status Achieved    Target Date 10/27/20      PT SHORT TERM [place] #3   Title Pt will spend less time in bed, 50% or less of her day to improve condition and overall health.    Baseline 10/13/20 reports getting out of bed for at least 2-3 hours at a time throughout the day and is walking more    Time 4    Period Weeks    Status Achieved               PT Long Term Goals - 11/10/20 1941       PT LONG TERM [place] #1   Title Pt will be independent with more advanced HEP    Baseline added to HEP today for closed chain exercises    Time 8    Period Weeks    Status On-going      PT LONG TERM [place] #2   Title Pt will be able to improve 5 x  sit to  stand time to less than 25 sec with use of UEs    Baseline 10.7 sed from mat, without UE    Time 8    Period Weeks    Status Achieved      PT LONG TERM [place] #3   Title Pt will be able to stand for up to 20 min in order to complete shopping, cooking without exacerbation of pain in back    Baseline 20 minutes  max, then pain up to 10/10    Time 8    Period Weeks    Status Achieved      PT LONG TERM [place] #4   Title FOTO score TBA  (predicted 49%)    Baseline status 11/10/20- improved to 60%    Time 8    Period Weeks    Status Achieved      PT LONG TERM Er[place] #5   Title Pt will be able to increase distance in 2 min walk test to > 200 feet with RPE < 15    Baseline 320 feet, RPE 14-15 on BORG    Time 8    Period Weeks    Status Partially Met                   Plan - 11/10/20 0804     Clinical Impression Statement Marjon reports increased tiredness over last couple of weeks she attributes to running out of meds. She started her meds back yesterday and is feeling better this morning. Overall she reports she is spending more time out of bed and is able to complete 15-20 minutes of household activity prior to exacerbation of back pain. After 20 minutes her pain can increase to 10/10. She has met many of her goals. Taci would like to be assessed for potential continuation of PT to maximize function and decrease her back pain with activity. Progressed her closed chain HEP today and Tionne reported feeling good at end of session.    PT Next Visit Plan update HEP, trunk flexibility , core/hip strengthening, continue closed chain- assess tolerance to updated HEP    PT Home Exercise Plan [facility]             Patient will benefit from skilled therapeutic intervention in order to improve the following deficits and impairments:  Abnormal gait, Cardiopulmonary status limiting activity, Decreased activity tolerance, Decreased strength, Increased fascial restricitons, Impaired UE functional use, Postural dysfunction, Pain, Impaired flexibility, Decreased range of motion, Obesity, Increased edema, Decreased balance, Decreased mobility, Decreased endurance, Difficulty walking, Hypomobility, Dizziness  Visit Diagnosis: Muscle weakness (generalized)  Difficulty in walking, not elsewhere  classified  Chronic bilateral low back pain without sciatica  Other abnormalities of gait and mobility     Problem List Patient Active Problem List   Diagnosis Date Noted   Chronic back pain 08/18/2020   Chronic heart failure with preserved ejection fraction ([place]) 02/27/2020   Obesity, diabetes, and hypertension syndrome ([place]) 02/27/2020   Vitreomacular adhesion of both eyes 02/09/2020   Pseudophakia of both eyes 02/09/2020   Chronic diastolic CHF (congestive heart failure) ([place]) 03/12/2019   Acute respiratory failure with hypoxia (HCC) 03/09/2019   Hypernatremia 03/09/2019   Suspected COVID-19 virus infection 03/09/2019   Acute encephalopathy 03/09/2019   Right sided weakness 02/20/2019   Thyroid mass 02/27/2017   Chronic pain of right knee 05/24/2016   Effusion, right knee 04/28/2016   Presence of right artificial knee joint 04/28/2016   Abnormal LFTs    Decreased sensation  Paresthesia 06/12/2014   Obstruction of kidney 04/24/2014   Diabetes type 2, uncontrolled ([place]) 04/24/2014   Chronic anticoagulation 04/24/2014   Crack cocaine use 04/24/2014   Depression 04/24/2014   Fibromyalgia 04/24/2014   COPD (chronic obstructive pulmonary disease) ([place]) 04/24/2014   OSA (obstructive sleep apnea) 04/24/2014   Lupus anticoagulant positive 04/24/2014   Adrenal mass, right ([place]) 04/24/2014   Right kidney mass 04/24/2014   Hydronephrosis of right kidney 04/24/2014   Supratherapeutic INR 04/24/2014   Renal mass, right 04/24/2014   Right lower quadrant abdominal pain    Morbid obesity ([place]) 08/21/2013   Constipation 08/19/2013   COPD with acute exacerbation ([place]) 08/17/2013   Morbid obesity with BMI of 45.0-49.9, adult ([place]) 01/05/2012   Bilateral deep vein thromboses ([place]) 12/13/2011   Abdominal pain 11/05/2011   Hypokalemia 11/05/2011   Sciatica 11/05/2011   Diabetes mellitus type 2 in obese ([place]) 11/05/2011   HTN (hypertension) 11/05/2011    Dorene Ar,  PTA 11/10/2020, 8:17 AM  [facility] [address] [place], [place], [address] Phone: [phone]   Fax:  [phone]  Name: Ashaya Raftery Courter MRN: [id number] Date of Birth: 01/15/1956

## 2020-11-11 ENCOUNTER — Ambulatory Visit: Payer: Medicare Other

## 2020-11-12 ENCOUNTER — Other Ambulatory Visit (HOSPITAL_BASED_OUTPATIENT_CLINIC_OR_DEPARTMENT_OTHER): Payer: Self-pay

## 2020-11-12 ENCOUNTER — Other Ambulatory Visit: Payer: Self-pay

## 2020-11-12 ENCOUNTER — Inpatient Hospital Stay (HOSPITAL_BASED_OUTPATIENT_CLINIC_OR_DEPARTMENT_OTHER): Payer: Medicare Other | Admitting: Oncology

## 2020-11-12 ENCOUNTER — Ambulatory Visit: Payer: Medicare Other | Attending: Internal Medicine

## 2020-11-12 ENCOUNTER — Other Ambulatory Visit: Payer: Medicare Other

## 2020-11-12 ENCOUNTER — Inpatient Hospital Stay: Payer: Medicare Other

## 2020-11-12 VITALS — BP 133/85 | HR 86 | Temp 98.2°F | Resp 20 | Ht 66.0 in | Wt 288.6 lb

## 2020-11-12 DIAGNOSIS — Z23 Encounter for immunization: Secondary | ICD-10-CM

## 2020-11-12 DIAGNOSIS — J449 Chronic obstructive pulmonary disease, unspecified: Secondary | ICD-10-CM | POA: Diagnosis not present

## 2020-11-12 DIAGNOSIS — Z7901 Long term (current) use of anticoagulants: Secondary | ICD-10-CM

## 2020-11-12 DIAGNOSIS — D6862 Lupus anticoagulant syndrome: Secondary | ICD-10-CM | POA: Diagnosis not present

## 2020-11-12 DIAGNOSIS — Z86718 Personal history of other venous thrombosis and embolism: Secondary | ICD-10-CM | POA: Diagnosis not present

## 2020-11-12 LAB — PROTIME-INR
INR: 2.4 — ABNORMAL HIGH (ref 0.8–1.2)
Prothrombin Time: 26.5 seconds — ABNORMAL HIGH (ref 11.4–15.2)

## 2020-11-12 MED ORDER — PFIZER-BIONT COVID-19 VAC-TRIS 30 MCG/0.3ML IM SUSP
INTRAMUSCULAR | 0 refills | Status: DC
  Filled 2020-11-12: qty 0.3, 1d supply, fill #0

## 2020-11-12 NOTE — Progress Notes (Signed)
  [place] OFFICE PROGRESS NOTE   Diagnosis: Anticoagulation therapy, history of venous thrombosis  INTERVAL HISTORY:   [name] returns as scheduled.  She continues Coumadin anticoagulation.  No bleeding or symptom of thrombosis.  She has chronic lower back pain and is participating in physical therapy.  Objective:  Vital signs in last 24 hours:  Blood pressure 133/85, pulse 86, temperature 98.2 F (36.8 C), temperature source Oral, resp. rate 20, height '5\' 6"'$  (1.676 m), weight 288 lb 9.6 oz (130.9 kg), SpO2 96 %.    Resp: Lungs clear bilaterally Cardio: Regular rate and rhythm Vascular: The left lower leg is slightly larger than the right side, no edema, no tenderness  Lab Results:  Lab Results  Component Value Date   WBC 6.1 08/18/2020   HGB 12.9 08/18/2020   HCT 41.6 08/18/2020   MCV 85.8 08/18/2020   PLT 343 08/18/2020   NEUTROABS 3.3 08/18/2020    CMP  Lab Results  Component Value Date   NA 139 08/18/2020   K 4.0 08/18/2020   CL 105 08/18/2020   CO2 24 08/18/2020   GLUCOSE 123 (H) 08/18/2020   BUN 13 08/18/2020   CREATININE 0.70 08/18/2020   CALCIUM 8.8 (L) 08/18/2020   PROT 7.7 08/18/2020   ALBUMIN 3.6 08/18/2020   AST 24 08/18/2020   ALT 22 08/18/2020   ALKPHOS 80 08/18/2020   BILITOT 0.4 08/18/2020   GFRNONAA >60 08/18/2020   GFRAA 94 03/05/2020     Medications: I have reviewed the patient's current medications.   Assessment/Plan: History of bilateral lower extremity deep vein thrombosis. She continues Coumadin anticoagulation. History of a positive lupus anticoagulant. History of a positive beta-2-glycoprotein IgA antibody. Chronic bilateral leg pain - negative bilateral Doppler January 2010. History of hypertension. COPD Status post hysterectomy. Remote history of cocaine abuse. History of an adrenal lesion on a CT scan in 2008 - status post a repeat CT on June 16, 2009, showing slow enlargement of the right adrenal  nodule since 2006, most consistent with an adenoma. Stable on a CT 04/24/2014 History of rectal and vaginal bleeding in May 2009.   Status post carpal tunnel surgery. 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 Right knee replacement September 2011. Multiple orthopedic conditions. Placement of IVC filter preoperatively before knee replacement. The IVC filter was retrieved 03/01/2011. Anemia. Ferritin in normal range at 82 on 08/18/2013; 61 on 07/10/2016 Status post lumbar fusion surgery October 2013 Right hydronephrosis-evaluated by Dr. Tresa Moore Left ankle swelling/pain 07/24/2016 Mild anemia noted on hospital admission 2018 Colonoscopy 04/19/2017-entire examined colon normal.  Repeat colonoscopy in 10 years for surveillance. Admission with COVID-19 infection 03/09/2019      Disposition: [name] appears stable.  The PT/INR is therapeutic.  She will continue Coumadin anticoagulation.  She will return for a monthly PT/INR.  She will be scheduled for an office visit in 8 months.  She will go to the pharmacy for a COVID-19 booster vaccine today.  [name], MD  11/12/2020  8:53 AM

## 2020-11-12 NOTE — Progress Notes (Signed)
   Covid-19 Vaccination Clinic  Name:  [name]    MRN: [id number] DOB: [birth year]  11/12/2020  [name] was observed post Covid-19 immunization for 15 minutes without incident. She was provided with Vaccine Information Sheet and instruction to access the V-Safe system.   [name] was instructed to call 911 with any severe reactions post vaccine: Difficulty breathing  Swelling of face and throat  A fast heartbeat  A bad rash all over body  Dizziness and weakness   Immunizations Administered     Name Date Dose VIS Date Route   PFIZER Comrnaty(Gray TOP) Covid-19 Vaccine 11/12/2020  9:18 AM 0.3 mL 02/26/2020 Intramuscular   Manufacturer: [place]   Lot: [id number]   NDC: [phone]

## 2020-11-17 ENCOUNTER — Telehealth: Payer: Self-pay | Admitting: Pharmacist

## 2020-11-17 ENCOUNTER — Ambulatory Visit: Payer: Medicare Other | Admitting: Physical Therapy

## 2020-11-17 ENCOUNTER — Telehealth: Payer: Self-pay

## 2020-11-17 NOTE — Progress Notes (Signed)
Chronic Care Management Pharmacy Assistant    Per Pharmacy prescription needed for Zonisamide 100 mg  Call to Dr Arlyn Leak Office at [phone] spoke to Elmyra Ricks she reports that patient was last seen in office on 01-19-2020 and she is in need of an appointment and submitted a request for [facility].  Call to patient to advise her she will need to make an appointment with this provider left msg.  Medications: Outpatient Encounter Medications as of 11/17/2020  Medication Sig   ACCU-CHEK GUIDE test strip USE UP TO FOUR TIMES DAILY AS DIRECTED.   Accu-Chek Softclix Lancets lancets USE UP TO FOUR TIMES DAILY AS DIRECTED.   albuterol (PROVENTIL HFA;VENTOLIN HFA) 108 (90 BASE) MCG/ACT inhaler Inhale 2 puffs into the lungs every 4 (four) hours as needed for shortness of breath.   albuterol (PROVENTIL) (2.5 MG/3ML) 0.083% nebulizer solution USE 1 VIAL IN NEBULIZER EVERY 6 HOURS - and as needed   blood glucose meter kit and supplies KIT Dispense based on patient and insurance preference. Use up to four times daily as directed. (FOR ICD-9 250.00, 250.01).   Brexpiprazole 3 MG TABS Take 3 mg by mouth daily.   canagliflozin (INVOKANA) 100 MG TABS tablet Take 1 tablet (100 mg total) by mouth daily before breakfast.   CARTIA XT 240 MG 24 hr capsule Take 1 capsule (240 mg total) by mouth daily.   cloNIDine (CATAPRES) 0.1 MG tablet Take 1 tablet (0.1 mg total) by mouth 2 (two) times daily.   COVID-19 mRNA Vac-TriS, Pfizer, (PFIZER-BIONT COVID-19 VAC-TRIS) SUSP injection Inject into the muscle.   diclofenac sodium (VOLTAREN) 1 % GEL Apply 2 g topically daily as needed (fibromyalgia pain).   DULoxetine (CYMBALTA) 20 MG capsule Take 20 mg by mouth daily.   DULoxetine (CYMBALTA) 60 MG capsule Take 60 mg by mouth at bedtime.    fluticasone (FLONASE) 50 MCG/ACT nasal spray Place 1 spray into both nostrils daily.    furosemide (LASIX) 40 MG tablet Take 1 tablet (40 mg total) by mouth daily.    gabapentin (NEURONTIN) 300 MG capsule Take 1 capsule (300 mg total) by mouth at bedtime.   levocetirizine (XYZAL) 5 MG tablet Take 1 tablet (5 mg total) by mouth at bedtime.   LINZESS 145 MCG CAPS capsule Take 1 capsule (145 mcg total) by mouth every other day.   meclizine (ANTIVERT) 25 MG tablet Take 1 tablet (25 mg total) by mouth 3 (three) times daily as needed for dizziness.   metFORMIN (GLUCOPHAGE) 500 MG tablet Take 1 tablet (500 mg total) by mouth at bedtime.   metoCLOPramide (REGLAN) 10 MG tablet Take 1 tablet (10 mg total) by mouth every 6 (six) hours as needed for nausea (or headache).   omeprazole (PRILOSEC) 20 MG capsule Take 1 capsule (20 mg total) by mouth 2 (two) times daily.   potassium chloride (MICRO-K) 10 MEQ CR capsule TAKE 2 CAPSULES(20 MEQ) BY MOUTH DAILY   pravastatin (PRAVACHOL) 40 MG tablet TAKE 1 TABLET(40 MG) BY MOUTH EVERY EVENING   SYMBICORT 160-4.5 MCG/ACT inhaler SMARTSIG:2 Puff(s) By Mouth Twice Daily   warfarin (COUMADIN) 10 MG tablet TAKE 1 TABLET BY MOUTH ONCE A DAY ON MONDAYS, WEDNESDAYS, AND FRIDAYS   warfarin (COUMADIN) 5 MG tablet TAKE 1 TABLET BY MOUTH DAILY EXCEPT ON MONDAY, WEDNESDAY, FRIDAY OR AS DIRECTED   zonisamide (ZONEGRAN) 100 MG capsule Take 1 capsule by mouth as directed. Take 1 tablet in the AM and 2 tablets in the PM  No facility-administered encounter medications on file as of 11/17/2020.    Care Gaps: Hepatitis C Screening - Overdue  Pap Smear - Overdue Eye exam - Overdue Flu Vaccine - Overdue  Star Rating Drugs: Pravastatin (Pravachol) 40 mg  - Last filled 10-26-2020 90 DS at [facility] Metformin 500 mg- Last filled 09-15-2020 for 90 DS at [facility]  Invokana 100 mg- Last filled 09-15-2020 for 90 DS at [facility] Pharmacist Assistant [phone]

## 2020-11-17 NOTE — Telephone Encounter (Signed)
Patient called requesting a Freestyle Elenor Legato and needs someone to call Ned at [phone] to set up

## 2020-11-24 ENCOUNTER — Encounter (INDEPENDENT_AMBULATORY_CARE_PROVIDER_SITE_OTHER): Payer: Self-pay

## 2020-11-24 DIAGNOSIS — E119 Type 2 diabetes mellitus without complications: Secondary | ICD-10-CM | POA: Diagnosis not present

## 2020-11-24 DIAGNOSIS — H04123 Dry eye syndrome of bilateral lacrimal glands: Secondary | ICD-10-CM | POA: Diagnosis not present

## 2020-11-24 DIAGNOSIS — Z961 Presence of intraocular lens: Secondary | ICD-10-CM | POA: Diagnosis not present

## 2020-11-24 DIAGNOSIS — H26492 Other secondary cataract, left eye: Secondary | ICD-10-CM | POA: Diagnosis not present

## 2020-11-24 DIAGNOSIS — H43823 Vitreomacular adhesion, bilateral: Secondary | ICD-10-CM | POA: Diagnosis not present

## 2020-11-24 LAB — HM DIABETES EYE EXAM

## 2020-11-25 ENCOUNTER — Encounter: Payer: Self-pay | Admitting: Family Medicine

## 2020-11-26 ENCOUNTER — Other Ambulatory Visit: Payer: Self-pay

## 2020-11-26 MED ORDER — WARFARIN SODIUM 5 MG PO TABS: ORAL_TABLET | ORAL | 0 refills | Status: DC

## 2020-11-29 ENCOUNTER — Encounter: Payer: Self-pay | Admitting: Podiatry

## 2020-11-29 ENCOUNTER — Other Ambulatory Visit: Payer: Self-pay

## 2020-11-29 ENCOUNTER — Ambulatory Visit (INDEPENDENT_AMBULATORY_CARE_PROVIDER_SITE_OTHER): Payer: Medicare Other | Admitting: Podiatry

## 2020-11-29 DIAGNOSIS — M79674 Pain in right toe(s): Secondary | ICD-10-CM | POA: Diagnosis not present

## 2020-11-29 DIAGNOSIS — M79675 Pain in left toe(s): Secondary | ICD-10-CM | POA: Diagnosis not present

## 2020-11-29 DIAGNOSIS — E1142 Type 2 diabetes mellitus with diabetic polyneuropathy: Secondary | ICD-10-CM | POA: Diagnosis not present

## 2020-11-29 DIAGNOSIS — J4540 Moderate persistent asthma, uncomplicated: Secondary | ICD-10-CM | POA: Insufficient documentation

## 2020-11-29 DIAGNOSIS — J309 Allergic rhinitis, unspecified: Secondary | ICD-10-CM | POA: Insufficient documentation

## 2020-11-29 DIAGNOSIS — J301 Allergic rhinitis due to pollen: Secondary | ICD-10-CM | POA: Insufficient documentation

## 2020-11-29 DIAGNOSIS — B351 Tinea unguium: Secondary | ICD-10-CM | POA: Diagnosis not present

## 2020-11-30 ENCOUNTER — Ambulatory Visit: Payer: Medicare Other | Attending: Neurosurgery | Admitting: Physical Therapy

## 2020-11-30 ENCOUNTER — Encounter: Payer: Self-pay | Admitting: Physical Therapy

## 2020-11-30 DIAGNOSIS — R262 Difficulty in walking, not elsewhere classified: Secondary | ICD-10-CM | POA: Insufficient documentation

## 2020-11-30 DIAGNOSIS — M545 Low back pain, unspecified: Secondary | ICD-10-CM | POA: Diagnosis not present

## 2020-11-30 DIAGNOSIS — R2689 Other abnormalities of gait and mobility: Secondary | ICD-10-CM | POA: Insufficient documentation

## 2020-11-30 DIAGNOSIS — M6281 Muscle weakness (generalized): Secondary | ICD-10-CM | POA: Insufficient documentation

## 2020-11-30 DIAGNOSIS — G8929 Other chronic pain: Secondary | ICD-10-CM | POA: Diagnosis not present

## 2020-11-30 NOTE — Therapy (Signed)
training;Patient/family education;Manual techniques;Passive range of motion;Dry needling;Cryotherapy    PT Next Visit Plan DC    PT Home Exercise Plan [id number]     Consulted and Agree with Plan of Care Patient             Patient will benefit from skilled therapeutic intervention in order to improve the following deficits and impairments:  Abnormal gait, Cardiopulmonary status limiting activity, Decreased activity tolerance, Decreased strength, Increased fascial restricitons, Impaired UE functional use, Postural dysfunction, Pain, Impaired flexibility, Decreased range of motion, Obesity, Increased edema, Decreased balance, Decreased mobility, Decreased endurance, Difficulty walking, Hypomobility, Dizziness  Visit Diagnosis: Muscle weakness (generalized)  Difficulty in walking, not elsewhere classified  Chronic bilateral low back pain without sciatica  Other abnormalities of gait and mobility     Problem List Patient Active Problem List   Diagnosis Date Noted   Allergic rhinitis 11/29/2020   Allergic rhinitis due to pollen 11/29/2020   Moderate persistent asthma, uncomplicated 01/18/5944   Osteoarthritis of spine with radiculopathy, lumbar region 08/24/2020   Chronic back pain 08/18/2020   Chronic heart failure with preserved ejection fraction ([place]) 02/27/2020   Obesity, diabetes, and hypertension syndrome ([place]) 02/27/2020   Vitreomacular adhesion of both eyes 02/09/2020   Pseudophakia of both eyes 02/09/2020   Chronic diastolic CHF (congestive heart failure) ([place]) 03/12/2019   Acute respiratory failure with hypoxia (HCC) 03/09/2019   Hypernatremia 03/09/2019   Suspected COVID-19 virus infection 03/09/2019   Acute encephalopathy 03/09/2019   Right sided weakness 02/20/2019   Thyroid mass 02/27/2017   Chronic pain of right knee 05/24/2016   Effusion, right knee 04/28/2016   Presence of right artificial knee joint 04/28/2016   Abnormal LFTs    Decreased sensation    Paresthesia 06/12/2014   Obstruction of kidney 04/24/2014   Diabetes type 2, uncontrolled ([place]) 04/24/2014   Chronic anticoagulation 04/24/2014   Crack cocaine use  04/24/2014   Depression 04/24/2014   Fibromyalgia 04/24/2014   COPD (chronic obstructive pulmonary disease) ([place]) 04/24/2014   OSA (obstructive sleep apnea) 04/24/2014   Lupus anticoagulant positive 04/24/2014   Adrenal mass, right ([place]) 04/24/2014   Right kidney mass 04/24/2014   Hydronephrosis of right kidney 04/24/2014   Supratherapeutic INR 04/24/2014   Renal mass, right 04/24/2014   Right lower quadrant abdominal pain    Morbid obesity ([place]) 08/21/2013   Constipation 08/19/2013   COPD with acute exacerbation ([place]) 08/17/2013   Lumbosacral spondylosis without myelopathy 11/19/2012   Morbid obesity with BMI of 45.0-49.9, adult ([place]) 01/05/2012   Bilateral deep vein thromboses ([place]) 12/13/2011   Abdominal pain 11/05/2011   Hypokalemia 11/05/2011   Sciatica 11/05/2011   Diabetes mellitus type 2 in obese ([place]) 11/05/2011   HTN (hypertension) 11/05/2011    Daniya Aramburo, PT 11/30/2020, 1:12 PM  [facility] [address] [place], [place], [address] Phone: [phone]   Fax:  [phone]  Name: [name] MRN: [id number] Date of Birth: 03-25-1955  PHYSICAL THERAPY DISCHARGE SUMMARY  Visits from Start of Care: 6  Current functional level related to goals / functional outcomes: See above    Remaining deficits: Endurance, back pain , core strength , trunk flexibility    Education / Equipment: HEP, activity   Patient agrees to discharge. Patient goals were met. Patient is being discharged due to being pleased with the current functional level.   [name], PT 11/30/20 1:12 PM Phone: [phone] Fax: [phone]  training;Patient/family education;Manual techniques;Passive range of motion;Dry needling;Cryotherapy    PT Next Visit Plan DC    PT Home Exercise Plan [email or web address]     Consulted and Agree with Plan of Care Patient             Patient will benefit from skilled therapeutic intervention in order to improve the following deficits and impairments:  Abnormal gait, Cardiopulmonary status limiting activity, Decreased activity tolerance, Decreased strength, Increased fascial restricitons, Impaired UE functional use, Postural dysfunction, Pain, Impaired flexibility, Decreased range of motion, Obesity, Increased edema, Decreased balance, Decreased mobility, Decreased endurance, Difficulty walking, Hypomobility, Dizziness  Visit Diagnosis: Muscle weakness (generalized)  Difficulty in walking, not elsewhere classified  Chronic bilateral low back pain without sciatica  Other abnormalities of gait and mobility     Problem List Patient Active Problem List   Diagnosis Date Noted   Allergic rhinitis 11/29/2020   Allergic rhinitis due to pollen 11/29/2020   Moderate persistent asthma, uncomplicated 01/18/5944   Osteoarthritis of spine with radiculopathy, lumbar region 08/24/2020   Chronic back pain 08/18/2020   Chronic heart failure with preserved ejection fraction ([place]) 02/27/2020   Obesity, diabetes, and hypertension syndrome ([place]) 02/27/2020   Vitreomacular adhesion of both eyes 02/09/2020   Pseudophakia of both eyes 02/09/2020   Chronic diastolic CHF (congestive heart failure) ([place]) 03/12/2019   Acute respiratory failure with hypoxia (HCC) 03/09/2019   Hypernatremia 03/09/2019   Suspected COVID-19 virus infection 03/09/2019   Acute encephalopathy 03/09/2019   Right sided weakness 02/20/2019   Thyroid mass 02/27/2017   Chronic pain of right knee 05/24/2016   Effusion, right knee 04/28/2016   Presence of right artificial knee joint 04/28/2016   Abnormal LFTs    Decreased sensation    Paresthesia 06/12/2014   Obstruction of kidney 04/24/2014   Diabetes type 2, uncontrolled ([place]) 04/24/2014   Chronic anticoagulation 04/24/2014   Crack cocaine use  04/24/2014   Depression 04/24/2014   Fibromyalgia 04/24/2014   COPD (chronic obstructive pulmonary disease) ([place]) 04/24/2014   OSA (obstructive sleep apnea) 04/24/2014   Lupus anticoagulant positive 04/24/2014   Adrenal mass, right ([place]) 04/24/2014   Right kidney mass 04/24/2014   Hydronephrosis of right kidney 04/24/2014   Supratherapeutic INR 04/24/2014   Renal mass, right 04/24/2014   Right lower quadrant abdominal pain    Morbid obesity ([place]) 08/21/2013   Constipation 08/19/2013   COPD with acute exacerbation ([place]) 08/17/2013   Lumbosacral spondylosis without myelopathy 11/19/2012   Morbid obesity with BMI of 45.0-49.9, adult ([place]) 01/05/2012   Bilateral deep vein thromboses ([place]) 12/13/2011   Abdominal pain 11/05/2011   Hypokalemia 11/05/2011   Sciatica 11/05/2011   Diabetes mellitus type 2 in obese ([place]) 11/05/2011   HTN (hypertension) 11/05/2011    Daniya Aramburo, PT 11/30/2020, 1:12 PM  [facility] [address] [place], [place], [address] Phone: [phone]   Fax:  [phone]  Name: [name] MRN: [id number] Date of Birth: 03-25-1955  PHYSICAL THERAPY DISCHARGE SUMMARY  Visits from Start of Care: 6  Current functional level related to goals / functional outcomes: See above    Remaining deficits: Endurance, back pain , core strength , trunk flexibility    Education / Equipment: HEP, activity   Patient agrees to discharge. Patient goals were met. Patient is being discharged due to being pleased with the current functional level.   [name], PT 11/30/20 1:12 PM Phone: [phone] Fax: [phone]  [place], [place], [address] Phone: [phone]   Fax:  [phone]  Physical Therapy Treatment/Discharge  Patient Details  Name: Kenny Stern Grout MRN: [id number] Date of Birth: 08/02/1955 Referring Provider (PT): Dr. [name]   Encounter Date: 11/30/2020   PT End of Session - 11/30/20 1240     Visit Number 6    Date for PT Re-Evaluation --    Authorization Type UHC MCR , MCD    PT Start Time 1230    PT Stop Time 8786    PT Time Calculation (min) 35 min    Activity Tolerance Patient tolerated treatment well    Behavior During Therapy [facility] for tasks assessed/performed             Past Medical History:  Diagnosis Date   Adrenal mass ([place]) 03/226   Benign   Arthritis    knees/multiple orthopedic conditons; lower back   Asthma    per pt   Clotting disorder (HCC)    +beta-2-glycoprotein IgA antibody   COPD (chronic obstructive pulmonary disease) ([place])    inhalers dependent on environment   Depression    Diabetes mellitus    120s usually fasting -  dx more than 10 yrs ago   Dizziness, nonspecific    DVT (deep venous thrombosis) (HCC)    Recurrent   Fibromyalgia    GERD (gastroesophageal reflux disease)    Heart murmur    History of blood transfusion    History of cocaine abuse ([place])    Remote history    Hyperlipidemia    Hypertension    takes meds daily   Hypothyroidism    Lupus Anticoagulant Positive    On home oxygen therapy    at night   Shortness of breath    exertion or lying flat   Sleep apnea    2l of oxygen at night (as of 12/6, she used to)    Past Surgical History:  Procedure Laterality Date   ABDOMINAL HYSTERECTOMY  1989   Fibroids   ANTERIOR LUMBAR FUSION  01/03/2012   Procedure: ANTERIOR LUMBAR FUSION 1 LEVEL;  Surgeon: Winfield Cunas, MD;  Location: [facility];  Service: Neurosurgery;  Laterality: N/A;  Lumbar Four-Five Anterior Lumbar Interbody Fusion  with Instrumentation   BACK SURGERY     cervical spine---disk disease   CARPAL TUNNEL RELEASE     Bilateral   CESAREAN SECTION     CHOLECYSTECTOMY     CYSTOSCOPY/RETROGRADE/URETEROSCOPY/STONE EXTRACTION WITH BASKET Right 04/26/2014   Procedure: CYSTOSCOPY/ RIGHT RETROGRADE/ RIGHT URETEROSCOPY/URETERAL AND RENAL PELVIS BIOPSY;  Surgeon: Alexis Frock, MD;  Location: [facility];  Service: Urology;  Laterality: Right;   gallstones removed     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   bilateral   right elbow surgery     THYROID LOBECTOMY Right 02/27/2017   THYROID LOBECTOMY Right 02/27/2017   Procedure: RIGHT THYROID LOBECTOMY;  Surgeon: Erroll Luna, MD;  Location: [place];  Service: General;  Laterality: Right;   TUBAL LIGATION      There were no vitals filed for this visit.   Subjective Assessment - 11/30/20 1235     Subjective I am going to take a break from PT I have alot of other appts to get to.  No pain really.  I am doing my exercises.  I just feel so tired I think the BP medicine makes me lazy.    Currently in Pain? No/denies

## 2020-12-02 NOTE — Progress Notes (Signed)
  Subjective:  Patient ID: [name], female    DOB: [birth year],  MRN: [id number]  65 y.o. female presents with at risk foot care with history of diabetic neuropathy and thick, elongated toenails 2-5 bilaterally which are tender when wearing enclosed shoe gear.  She states she had dry skin on her left great toe and pulled it off. Patient states it bled a bit. She cleansed with alcohol daily.  Patient does not monitor blood glucose on a daily basis.  PCP: Billie Ruddy, MD and last visit was: 09/23/2020.  Review of Systems: Negative except as noted in the HPI.   Allergies  Allergen Reactions   Lisinopril Swelling    THROAT SWELLING Pt states her throat started to "close up" after being on it for "so long."   Corticosteroids Rash    States she developed a rash on her tongue after a steroid injection in her bac    Objective:  There were no vitals filed for this visit. Constitutional Patient is a pleasant 65 y.o. African American female morbidly obese in NAD. AAO x 3.  Vascular Capillary fill time to digits <3 seconds b/l.  DP pulse(s) are palpable b/l lower extremities. PT pulses nonpalpable b/l. Pedal hair absent. Lower extremity skin temperature gradient within normal limits. No pain with calf compression b/l. Nonpitting edema b/l feet.  No cyanosis or clubbing noted.  Neurologic Protective sensation diminished b/l feet.  Dermatologic Pedal skin is warm and supple b/l.  No open wounds b/l lower extremities. Evidence of permanent total nail avulsions of  both great toes. There is evidence of dried heme of left great toe nailbed. No erythema, no edema, no drainage, no fluctuance. No interdigital macerations b/l lower extremities. Toenails 2-5 b/l elongated, discolored, dystrophic, thickened, crumbly with subungual debris and tenderness to dorsal palpation.   Orthopedic: Normal muscle strength 5/5 to all lower extremity muscle groups bilaterally. No gross bony deformities.    Hemoglobin A1C Latest Ref Rng & Units 09/15/2020 06/07/2020 02/02/2020  HGBA1C 4.0 - 5.6 % 7.5(A) 8.7(H) 8.8(H)  Some recent data might be hidden   Assessment:   1. Pain due to onychomycosis of toenails of both feet   2. Diabetic peripheral neuropathy associated with type 2 diabetes mellitus ([place])    Plan:  Patient was evaluated and treated and all questions answered. Consent given for treatment as described below: -Examined patient. -Nailbeds of bilateral great toes assessed. Removed dried HKT debris from both great toes. Cleansed with alcohol and triple antibiotic ointment applied. -Continue diabetic foot care principles: inspect feet daily, monitor glucose as recommended by PCP and/or Endocrinologist, and follow prescribed diet per PCP, Endocrinologist and/or dietician. -Patient to continue soft, supportive shoe gear daily. -Toenails 2-5 bilaterally debrided in length and girth without iatrogenic bleeding with sterile nail nipper and dremel.  -Patient to report any pedal injuries to medical professional immediately. -Patient/POA to call should there be question/concern in the interim.  Return in about 3 months (around 02/28/2021).  Marzetta Board, DPM

## 2020-12-03 ENCOUNTER — Other Ambulatory Visit: Payer: Self-pay | Admitting: *Deleted

## 2020-12-03 DIAGNOSIS — I82403 Acute embolism and thrombosis of unspecified deep veins of lower extremity, bilateral: Secondary | ICD-10-CM

## 2020-12-03 DIAGNOSIS — Z7901 Long term (current) use of anticoagulants: Secondary | ICD-10-CM

## 2020-12-06 DIAGNOSIS — J449 Chronic obstructive pulmonary disease, unspecified: Secondary | ICD-10-CM | POA: Diagnosis not present

## 2020-12-06 NOTE — Telephone Encounter (Signed)
Forms faxed

## 2020-12-08 ENCOUNTER — Encounter: Payer: Self-pay | Admitting: Registered"

## 2020-12-08 ENCOUNTER — Other Ambulatory Visit: Payer: Self-pay

## 2020-12-08 ENCOUNTER — Encounter: Payer: Medicare Other | Attending: Family Medicine | Admitting: Registered"

## 2020-12-08 DIAGNOSIS — E119 Type 2 diabetes mellitus without complications: Secondary | ICD-10-CM | POA: Diagnosis not present

## 2020-12-08 NOTE — Progress Notes (Signed)
Diabetes Self-Management Education  Visit Type:  Follow-up  Appt. Start Time: 9:04  Appt. End Time: 9:49  12/08/2020  [name], identified by name and date of birth, is a 65 y.o. female with a diagnosis of Diabetes:  .   ASSESSMENT  Checked BS and it was typical numbers as before.  States she will have A1c checked again on Mon, 9/26 at the cancer center. Goes to [facility] [facility] [facility] location.   States she has had dilated eye exam since previous visit. Reports all is well. States left eye needed laser procedure due to cloudiness. Reports she will have tooth extraction on 10/10.   States she has been walking, sometimes with daughter. States she has been walking more in her apartment. States she just finished with physical therapy.   There were no vitals taken for this visit. There is no height or weight on file to calculate BMI.    Diabetes Self-Management Education - 41/28/78 6767       Complications   How often do you check your blood sugar? 1-2 times/day    Fasting Blood glucose range (mg/dL) 70-129    Postprandial Blood glucose range (mg/dL) 130-179    Number of hypoglycemic episodes per month 0    Number of hyperglycemic episodes per week 0    Have you had a dilated eye exam in the past 12 months? Yes    Have you had a dental exam in the past 12 months? No    Are you checking your feet? Yes    How many days per week are you checking your feet? 7      Dietary Intake   Breakfast skipped    Lunch 12 pm - chicken + macaroni salad      Exercise   Exercise Type Light (walking / raking leaves)    How many days per week to you exercise? 6    How many minutes per day do you exercise? 10    Total minutes per week of exercise 60      Patient Education   Previous Diabetes Education No      Individualized Goals (developed by patient)   Reducing Risk do foot checks daily;increase portions of nuts and seeds      Post-Education Assessment   Patient understands using medications  safely. Demonstrates understanding / competency    Patient understands prevention, detection, and treatment of chronic complications. Demonstrates understanding / competency      Outcomes   Program Status Completed      Subsequent Visit   Since your last visit have you continued or begun to take your medications as prescribed? Yes    Since your last visit have you had your blood pressure checked? Yes    Is your most recent blood pressure lower, unchanged, or higher since your last visit? Unchanged    Since your last visit, are you checking your blood glucose at least once a day? Yes             Learning Objective:  Patient will have a greater understanding of diabetes self-management. Patient education plan is to attend individual and/or group sessions per assessed needs and concerns.   Plan:   Patient Instructions  - Aim to have 3 meals a day.   - When feeling hot and sweating, check blood sugar in that moment. If blood sugar is below 70, then follow the "15-15 rule" to increase blood sugar number beyond 90.   - Have nuts along with oatmeal  for balance.   - Check out [employer] for recipe options.   - Keep up the great work being more active!     Expected Outcomes:  Demonstrated interest in learning. Expect positive outcomes  Education material provided: none  If problems or questions, patient to contact team via:  Phone and Email  Future DSME appointment: - 4-6 wks

## 2020-12-08 NOTE — Patient Instructions (Addendum)
-   Aim to have 3 meals a day.   - When feeling hot and sweating, check blood sugar in that moment. If blood sugar is below 70, then follow the "15-15 rule" to increase blood sugar number beyond 90.   - Have nuts along with oatmeal for balance.   - Check out [employer] for recipe options.   - Keep up the great work being more active!

## 2020-12-13 ENCOUNTER — Inpatient Hospital Stay: Payer: Medicare Other | Attending: Oncology

## 2020-12-17 ENCOUNTER — Other Ambulatory Visit: Payer: Self-pay

## 2020-12-17 ENCOUNTER — Encounter: Payer: Self-pay | Admitting: Internal Medicine

## 2020-12-17 ENCOUNTER — Ambulatory Visit (INDEPENDENT_AMBULATORY_CARE_PROVIDER_SITE_OTHER): Payer: Medicare Other | Admitting: Internal Medicine

## 2020-12-17 VITALS — BP 138/90 | HR 92 | Ht 66.0 in | Wt 287.2 lb

## 2020-12-17 DIAGNOSIS — E669 Obesity, unspecified: Secondary | ICD-10-CM | POA: Diagnosis not present

## 2020-12-17 DIAGNOSIS — I152 Hypertension secondary to endocrine disorders: Secondary | ICD-10-CM

## 2020-12-17 DIAGNOSIS — E1169 Type 2 diabetes mellitus with other specified complication: Secondary | ICD-10-CM | POA: Diagnosis not present

## 2020-12-17 DIAGNOSIS — I5032 Chronic diastolic (congestive) heart failure: Secondary | ICD-10-CM | POA: Diagnosis not present

## 2020-12-17 DIAGNOSIS — E1159 Type 2 diabetes mellitus with other circulatory complications: Secondary | ICD-10-CM

## 2020-12-17 NOTE — Progress Notes (Signed)
Cardiology Office Note:    Date:  12/17/2020   ID:  [name], DOB 1956-03-12, MRN [id number]  PCP:  Billie Ruddy, MD  [facility] Cardiologist:  [name] MD [place] Electrophysiologist:  None   CC: Follow up CCTA  History of Present Illness:    [name] is a 65 y.o. 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  In interim had ziopatch with no concerning findings.  Last seen 04/13/20.  In interim of this visit, patient CCTA and Echo- notable from improved LAP and aortic atherosclerosis, and a hiatal hernia.  During last visit started new lasix dose.  Seen 12/17/20.  Patient notes that she is doing about the same.  Since last visit notes that her scale at home shows she has lost weight. No new medical tests.  There are no interval hospital/ED visit.    Feels fine. Still feels tired.  Has sleeping issues and can't go to sleep because of her fibromyalgia (worse with the weather). No chest pain or pressure.  No SOB/DOE and no PND/Orthopnea. Left leg is persistently swollen. No palpitations or syncope .  Ambulatory blood pressure has been a bit elevated in the last couple of days in the setting of increase pain.   Past Medical History:  Diagnosis Date   Adrenal mass ([place]) 03/226   Benign   Arthritis    knees/multiple orthopedic conditons; lower back   Asthma    per pt   Clotting disorder (HCC)    +beta-2-glycoprotein IgA antibody   COPD (chronic obstructive pulmonary disease) (HCC)    inhalers dependent on environment   Depression    Diabetes mellitus    120s usually fasting -  dx more than 10 yrs ago   Dizziness, nonspecific    DVT (deep venous thrombosis) (HCC)    Recurrent   Fibromyalgia    GERD (gastroesophageal reflux disease)    Heart murmur    History of  blood transfusion    History of cocaine abuse ([place])    Remote history    Hyperlipidemia    Hypertension    takes meds daily   Hypothyroidism    Lupus Anticoagulant Positive    On home oxygen therapy    at night   Shortness of breath    exertion or lying flat   Sleep apnea    2l of oxygen at night (as of 12/6, she used to)    Past Surgical History:  Procedure Laterality Date   ABDOMINAL HYSTERECTOMY  1989   Fibroids   ANTERIOR LUMBAR FUSION  01/03/2012   Procedure: ANTERIOR LUMBAR FUSION 1 LEVEL;  Surgeon: Winfield Cunas, MD;  Location: [facility];  Service: Neurosurgery;  Laterality: N/A;  Lumbar Four-Five Anterior Lumbar Interbody Fusion with Instrumentation   BACK SURGERY     cervical spine---disk disease   CARPAL TUNNEL RELEASE     Bilateral   CESAREAN SECTION     CHOLECYSTECTOMY     CYSTOSCOPY/RETROGRADE/URETEROSCOPY/STONE EXTRACTION WITH BASKET Right 04/26/2014   Procedure: CYSTOSCOPY/ RIGHT RETROGRADE/ RIGHT URETEROSCOPY/URETERAL AND RENAL PELVIS BIOPSY;  Surgeon: Alexis Frock, MD;  Location: [facility];  Service: Urology;  Laterality: Right;   gallstones removed     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   bilateral   right elbow surgery     THYROID LOBECTOMY  Right 02/27/2017   THYROID LOBECTOMY Right 02/27/2017   Procedure: RIGHT THYROID LOBECTOMY;  Surgeon: Erroll Luna, MD;  Location: [place];  Service: General;  Laterality: Right;   TUBAL LIGATION      Current Medications: No outpatient medications have been marked as taking for the 12/17/20 encounter (Office Visit) with Werner Lean, MD.    Allergies:   Lisinopril and Corticosteroids   Social History   Socioeconomic History   Marital status: Divorced    Spouse name: Not on file   Number of children: Not on file   Years of education: Not on file   Highest education level: Not on file  Occupational History   Not on file  Tobacco Use   Smoking status: Never   Smokeless tobacco: Never  Vaping Use    Vaping Use: Never used  Substance and Sexual Activity   Alcohol use: No    Alcohol/week: 0.0 standard drinks    Comment: beer in past    Drug use: Yes    Types: Cocaine    Comment: quit 2003-rehab progam in 2005   Sexual activity: Never  Other Topics Concern   Not on file  Social History Narrative   Lives alone in apartment, drives   Social Determinants of Health   Financial Resource Strain: Low Risk    Difficulty of Paying Living Expenses: Not hard at all  Food Insecurity: No Food Insecurity   Worried About Charity fundraiser in the Last Year: Never true   [place] in the Last Year: Never true  Transportation Needs: No Transportation Needs   Lack of Transportation (Medical): No   Lack of Transportation (Non-Medical): No  Physical Activity: Inactive   Days of Exercise per Week: 0 days   Minutes of Exercise per Session: 0 min  Stress: No Stress Concern Present   Feeling of Stress : Not at all  Social Connections: Moderately Isolated   Frequency of Communication with Friends and Family: More than three times a week   Frequency of Social Gatherings with Friends and Family: More than three times a week   Attends Religious Services: More than 4 times per year   Active Member of [employer] or Organizations: No   Attends Music therapist: Never   Marital Status: Divorced    Family History: The patient's family history includes COPD in an other family member; Cancer in her mother; Diabetes in her mother; Heart failure in her father; Hyperlipidemia in an other family member; Hypertension in her father and mother. History of coronary artery disease notable for no members. History of heart failure notable for both parents. History of arrhythmia notable for no members. Denies family history of sudden cardiac death including drowning, car accidents, or unexplained deaths in the family. Mother->heart murmur  ROS:   Please see the history of present illness.    All  other systems reviewed and are negative.  EKGs/Labs/Other Studies Reviewed:    The following studies were reviewed today:  EKG:  04/13/20: SR 92 LVH 02/02/20:  SR Rate 85 LVH with secondary repolarization  Transthoracic Echocardiogram Date:05/04/20  Results:   IMPRESSIONS   1. Left ventricular ejection fraction, by estimation, is 60 to 65%. The  left ventricle has normal function. The left ventricle has no regional  wall motion abnormalities. Left ventricular diastolic parameters are  consistent with Grade I diastolic  dysfunction (impaired relaxation).   2. Right ventricular systolic function is normal. The right ventricular  size is  normal.   3. The mitral valve is normal in structure. No evidence of mitral valve  regurgitation. No evidence of mitral stenosis.   4. The aortic valve is normal in structure. Aortic valve regurgitation is  not visualized.    Cardiac Monitor: Date: 03/25/20 Results:  Patient had a minimum heart rate of 67 bpm, maximum heart rate of 164 bpm, and average heart rate of 88 bpm. Predominant underlying rhythm was sinus rhythm. One run of ventricular tachycardia occurred lasting 5 beats at longest with a max rate of 164 bpm at fastest. Thirteen runs of supraventricular tachycardia occurred lasting 14 beats at longest with a max rate of 162 bpm at fastest. Isolated PACs were rare (<1.0%), with rare couplets and triplets present. Isolated PVCs were rare (<1.0%), with rare couplets and triplets present; bigeminy and trigeminy present. No evidence of complete heart block . Triggered and diary events associated overwhelming with sinus rhythm or sinus tachycardia; once with PVC.   No malignant arrhythmias.   CardiacCT : Date:05/07/20 Results: IMPRESSION: 1. Coronary calcium score of 0. This was 1st percentile for age, sex, and race matched control. 2. Normal coronary origin with right dominance. 3. CAD-RADS 0. No evidence of CAD (0%). Consider  non-atherosclerotic causes of discomfort.   IMPRESSION: 1. No acute findings in the imaged extracardiac chest. 2. Hepatic steatosis. 3. Small hiatal hernia.  Recent Labs: 02/02/2020: Brain Natriuretic Peptide 11 06/07/2020: TSH 4.23 06/18/2020: Magnesium 1.8 08/18/2020: ALT 22; BUN 13; Creatinine, Ser 0.70; Hemoglobin 12.9; Platelets 343; Potassium 4.0; Sodium 139  Recent Lipid Panel    Component Value Date/Time   CHOL 194 02/02/2020 1126   TRIG 196 (H) 02/02/2020 1126   HDL 76 02/02/2020 1126   CHOLHDL 2.6 02/02/2020 1126   VLDL 20 04/24/2014 2245   LDLCALC 89 02/02/2020 1126   Risk Assessment/Calculations:     N/A  Physical Exam:    VS:  BP 138/90   Pulse 92   Ht 5\' 6"  (1.676 m)   Wt 287 lb 3.2 oz (130.3 kg)   SpO2 98%   BMI 46.36 kg/m     Wt Readings from Last 3 Encounters:  12/17/20 287 lb 3.2 oz (130.3 kg)  11/12/20 288 lb 9.6 oz (130.9 kg)  09/15/20 280 lb 6.4 oz (127.2 kg)    GEN: Obese female, well developed with no acute distress HEENT: Normal NECK: No JVD  LYMPHATICS: No lymphadenopathy CARDIAC: RRR, no murmurs, rubs, gallops RESPIRATORY:  Clear to auscultation without rales, wheezing or rhonchi ABDOMEN: Soft, non-tender, non-distended MUSCULOSKELETAL:  Non-pitting edema; No deformity  SKIN: Warm and dry NEUROLOGIC:  Alert and oriented x 3 PSYCHIATRIC:  Normal affect   ASSESSMENT:    1. Chronic heart failure with preserved ejection fraction (HCC)   2. Obesity, diabetes, and hypertension syndrome (HCC)     PLAN:    In order of problems listed above:  Heart Failure Preserved Ejection Fraction  In the setting of Morbid Obesity, COPD and OSA overlap DVT on chronic AC - NYHA class II, Stage B, euvolemic etiology in the setting of DM/HTN - Diuretic regimen:Continue Invokana,lasix 40 mg PO daily  Morbid Obesity/DM/HTN OSA on CPAP - ambulatory blood pressure at [place] - OSA and is on CPAP follow by [facility] Sleep Medicine - if BP elevated after her  pain is better controlled, will increase fluid pill  HLD - given no significant aortic atherosclerosis on my review -  LDL [place] < 100  Asx SVT on heart monitor - no AF;  no symptomatic SVT - AV Nodal Therapy: continue diltiazem  Six months follow up unless new symptoms or abnormal test results warranting change in plan  Would be reasonable for  APP Follow up   Medication Adjustments/Labs and Tests Ordered: Current medicines are reviewed at length with the patient today.  Concerns regarding medicines are outlined above.  No orders of the defined types were placed in this encounter.  No orders of the defined types were placed in this encounter.   Patient Instructions  Medication Instructions:  Your physician recommends that you continue on your current medications as directed. Please refer to the Current Medication list given to you today.  *If you need a refill on your cardiac medications before your next appointment, please call your pharmacy*   Lab Work: NONE If you have labs (blood work) drawn today and your tests are completely normal, you will receive your results only by: [place] (if you have MyChart) OR A paper copy in the mail If you have any lab test that is abnormal or we need to change your treatment, we will call you to review the results.   Testing/Procedures: NONE   Follow-Up: At [facility], you and your health needs are our priority.  As part of our continuing mission to provide you with exceptional heart care, we have created designated Provider Care Teams.  These Care Teams include your primary Cardiologist (physician) and Advanced Practice Providers (APPs -  Physician Assistants and Nurse Practitioners) who all work together to provide you with the care you need, when you need it.  Your next appointment:   6 month(s)  The format for your next appointment:   In Person  Provider:   You may see [name], MD or one of the following  Advanced Practice Providers on your designated Care Team:   Melina Copa, PA-C Ermalinda Barrios, PA-C    Signed, Werner Lean, MD  12/17/2020 10:09 AM    [place]

## 2020-12-17 NOTE — Patient Instructions (Signed)
Medication Instructions:  Your physician recommends that you continue on your current medications as directed. Please refer to the Current Medication list given to you today.  *If you need a refill on your cardiac medications before your next appointment, please call your pharmacy*   Lab Work: NONE If you have labs (blood work) drawn today and your tests are completely normal, you will receive your results only by: [place] (if you have MyChart) OR A paper copy in the mail If you have any lab test that is abnormal or we need to change your treatment, we will call you to review the results.   Testing/Procedures: NONE   Follow-Up: At [facility], you and your health needs are our priority.  As part of our continuing mission to provide you with exceptional heart care, we have created designated Provider Care Teams.  These Care Teams include your primary Cardiologist (physician) and Advanced Practice Providers (APPs -  Physician Assistants and Nurse Practitioners) who all work together to provide you with the care you need, when you need it.  Your next appointment:   6 month(s)  The format for your next appointment:   In Person  Provider:   You may see [name], MD or one of the following Advanced Practice Providers on your designated Care Team:   Melina Copa, PA-C Ermalinda Barrios, PA-C

## 2020-12-21 ENCOUNTER — Other Ambulatory Visit: Payer: Self-pay | Admitting: Oncology

## 2020-12-22 ENCOUNTER — Telehealth: Payer: Self-pay | Admitting: Pharmacist

## 2020-12-22 ENCOUNTER — Other Ambulatory Visit: Payer: Self-pay

## 2020-12-22 ENCOUNTER — Other Ambulatory Visit: Payer: Self-pay | Admitting: Family Medicine

## 2020-12-22 NOTE — Chronic Care Management (AMB) (Addendum)
Chronic Care Management Pharmacy Assistant   Name: [name]  MRN: [id number] DOB: 09/01/1955  Reason for Encounter: Disease State and Medication Review / Medication Coordination Call and Hypertension Assessment Call   Conditions to be addressed/monitored: HTN   Recent office visits:  None  Recent consult visits:  12/17/2020 [name] MD (cardiology) -  Patient was seen for chronic heart failure with preserved ejection fraction and additional issue. No medication changes. Follow up in 6 months unless new symptoms.   11/29/2020 Acquanetta Sit DPM (podiatry) - Patient was seen for pain due to onychomycosis or toenails of both feet and an additional issue. Changed Brexpiprazole from 3 mg daily to 2 mg daily. Follow up in 3 months.  Hospital visits:  None in previous 6 months  Medications: Outpatient Encounter Medications as of 12/22/2020  Medication Sig   ACCU-CHEK GUIDE test strip USE UP TO FOUR TIMES DAILY AS DIRECTED.   Accu-Chek Softclix Lancets lancets USE UP TO FOUR TIMES DAILY AS DIRECTED.   albuterol (PROVENTIL HFA;VENTOLIN HFA) 108 (90 BASE) MCG/ACT inhaler Inhale 2 puffs into the lungs every 4 (four) hours as needed for shortness of breath.   albuterol (PROVENTIL) (2.5 MG/3ML) 0.083% nebulizer solution USE 1 VIAL IN NEBULIZER EVERY 6 HOURS - and as needed   blood glucose meter kit and supplies KIT Dispense based on patient and insurance preference. Use up to four times daily as directed. (FOR ICD-9 250.00, 250.01).   brexpiprazole (REXULTI) 2 MG TABS tablet Take 1 tablet by mouth daily.   budesonide-formoterol (SYMBICORT) 160-4.5 MCG/ACT inhaler Inhale 2 puffs into the lungs 2 (two) times daily.   canagliflozin (INVOKANA) 100 MG TABS tablet Take 1 tablet (100 mg total) by mouth daily before breakfast.   CARTIA XT 240 MG 24 hr capsule Take 1 capsule (240 mg total) by mouth daily.   cloNIDine (CATAPRES) 0.1 MG tablet Take 1 tablet (0.1 mg total) by  mouth 2 (two) times daily.   COVID-19 mRNA Vac-TriS, Pfizer, (PFIZER-BIONT COVID-19 VAC-TRIS) SUSP injection Inject into the muscle.   diclofenac sodium (VOLTAREN) 1 % GEL Apply 2 g topically daily as needed (fibromyalgia pain).   DULoxetine (CYMBALTA) 20 MG capsule Take 20 mg by mouth daily.   DULoxetine (CYMBALTA) 60 MG capsule Take 60 mg by mouth at bedtime.    fluticasone (FLONASE) 50 MCG/ACT nasal spray Place 2 sprays into both nostrils 2 (two) times daily.   furosemide (LASIX) 40 MG tablet Take 1 tablet (40 mg total) by mouth daily.   gabapentin (NEURONTIN) 300 MG capsule Take 1 capsule (300 mg total) by mouth at bedtime.   hydrochlorothiazide (HYDRODIURIL) 12.5 MG tablet    levocetirizine (XYZAL) 5 MG tablet Take 1 tablet (5 mg total) by mouth at bedtime.   LINZESS 145 MCG CAPS capsule Take 1 capsule (145 mcg total) by mouth every other day.   loratadine (CLARITIN) 10 MG tablet 1 tablet   meclizine (ANTIVERT) 25 MG tablet Take 1 tablet (25 mg total) by mouth 3 (three) times daily as needed for dizziness.   metFORMIN (GLUCOPHAGE) 500 MG tablet Take 1 tablet (500 mg total) by mouth at bedtime.   metoCLOPramide (REGLAN) 10 MG tablet Take 1 tablet (10 mg total) by mouth every 6 (six) hours as needed for nausea (or headache).   omeprazole (PRILOSEC) 20 MG capsule Take 1 capsule (20 mg total) by mouth 2 (two) times daily.   potassium chloride (MICRO-K) 10 MEQ CR capsule TAKE 2 CAPSULES(20 MEQ) BY MOUTH  DAILY   pravastatin (PRAVACHOL) 40 MG tablet TAKE 1 TABLET(40 MG) BY MOUTH EVERY EVENING   traMADol (ULTRAM) 50 MG tablet Take by mouth. (Patient not taking: Reported on 12/17/2020)   umeclidinium-vilanterol (ANORO ELLIPTA) 62.5-25 MCG/INH AEPB Inhale into the lungs.   warfarin (COUMADIN) 10 MG tablet TAKE 1 TABLET BY MOUTH ONCE A DAY ON MONDAYS, WEDNESDAYS, AND FRIDAYS   warfarin (COUMADIN) 5 MG tablet TAKE ONE TABLET BY MOUTH DAILY EXCEPT MONDAY, WEDNESDAY, & Friday OR AS DIRECTED   zonisamide  (ZONEGRAN) 100 MG capsule Take 1 capsule by mouth as directed. Take 1 tablet in the AM and 2 tablets in the PM   zonisamide (ZONEGRAN) 50 MG capsule TK 2 CS PO BID   No facility-administered encounter medications on file as of 12/22/2020.   Fill History:  Rexulti 3 mg tablet 11/08/2020 30   SYMBICORT 160/4.5MCG (120 ORAL INH) 10/05/2020 30   Invokana 100 mg tablet 12/07/2020 19   duloxetine 60 mg capsule,delayed release 11/09/2020 30   FUROSEMIDE $RemoveBef'40MG'pSmDUdSnqI$  TABLETS 09/07/2020 90   gabapentin 300 mg capsule 11/08/2020 52   levocetirizine 5 mg tablet 12/07/2020 19   OMEPRAZOLE $RemoveBef'20MG'NKNNpZLVcx$  CAPSULES 09/15/2020 90   potassium chloride ER 10 mEq capsule,extended release 11/17/2020 44   PRAVASTATIN $RemoveBefo'40MG'mAHolMqVbHp$  TABLETS 10/26/2020 90   warfarin 5 mg tablet 11/26/2020 30   clonidine HCl 0.1 mg tablet 11/05/2020 59   DILTIAZEM CD $RemoveBefo'240MG'sAWjrRdVoKc$  CAPSULES (24 HR) 09/23/2020 90   Linzess 145 mcg capsule 11/09/2020 60   metformin 500 mg tablet 12/07/2020 23   Reviewed chart for medication changes ahead of medication coordination call.   BP Readings from Last 3 Encounters:  12/17/20 138/90  11/12/20 133/85  11/10/20 (!) 158/95    Lab Results  Component Value Date   HGBA1C 7.5 (A) 09/15/2020     Patient obtains medications through Adherence Packaging  30 Days   Last adherence delivery included:  Potassium CL ER 9meq take 2 capsules at bedtime Linzess 145 mcg - take 1 capsule every other day Clonidine 0.1 mg - take 1 tablet two times daily Metformin 500 mg - take 1 tablet at bedtime Gabapentin 300 mg - take 1 capsule at bedtime Warfarin 5 mg - TAKE 1 tablet daily except  Monday, Wednesday & Friday OR AS DIRECTED Levocetirizine 5 mg - take 1 tablet every evening Invokana 100 mg - take 1 tablet before breakfast Rexulti 3 mg - take 1 tablet at bedtime Duloxetine 60 mg - take 1 capsule every morning   Patient is due for next adherence delivery on: 01/03/2021.  This delivery to include: Metformin 500  mg - take 1 tablet at bedtime Pravastatin 40 mg  - TAKE 1 TABLET EVERY EVENING Clonidine 0.1 mg - take 1 tablet two times daily Invokana 100 mg - take 1 tablet before breakfast Cartia XT 240 mg - Take 1 capsule  daily Furosemide 20 mg - Take 1 tablet before breakfast Duloxetine 20 mg  - take 1 tablet before breakfast Duloxetine 60 mg - take 1 tablet at bedtime Levocetirizine 5 mg - take 1 tablet every evening Zonisamide 100 mg - Take 1 tablet in the AM and 2 tablets in the PM Potassium CL ER 10meq take 2 capsules at bedtime Gabapentin 300 mg - take 1 capsule twice daily Rexulti 3 mg - take 1 tablet at bedtime Omeprazole - 20 mg - Take 1 capsule times daily Warfarin 5 mg - TAKE 1 tablet daily except  Monday, Wednesday & Friday OR AS DIRECTED  Patient was unable to be contacted.  Note closed after 3 attempts.  Called Dr. Volanda Napoleon office, spoke with Joycelyn Schmid, explained the need for new scripts to be sent for Duloxetine 55m and 631m also Zonisamide 10080mMarJoycelyn Schmids made aware medication preparation for delivery is pending these prescriptions.   Recent Office Vitals: BP Readings from Last 3 Encounters:  12/17/20 138/90  11/12/20 133/85  11/10/20 (!) 158/95   Pulse Readings from Last 3 Encounters:  12/17/20 92  11/12/20 86  11/10/20 (!) 107    Wt Readings from Last 3 Encounters:  12/17/20 287 lb 3.2 oz (130.3 kg)  11/12/20 288 lb 9.6 oz (130.9 kg)  09/15/20 280 lb 6.4 oz (127.2 kg)     Kidney Function Lab Results  Component Value Date/Time   CREATININE 0.70 08/18/2020 04:27 PM   CREATININE 0.82 06/18/2020 09:09 AM   CREATININE 0.81 02/02/2020 11:26 AM   GFR 89.49 06/07/2020 10:00 AM   GFRNONAA >60 08/18/2020 04:27 PM   GFRNONAA 77 02/02/2020 11:26 AM   GFRAA 94 03/05/2020 11:55 AM   GFRAA 89 02/02/2020 11:26 AM    BMP Latest Ref Rng & Units 08/18/2020 06/18/2020 06/07/2020  Glucose 70 - 99 mg/dL 123(H) 183(H) 192(H)  BUN 8 - 23 mg/dL 13 16 16   Creatinine 0.44 - 1.00 mg/dL  0.70 0.82 0.71  BUN/Creat Ratio 12 - 28 - 20 -  Sodium 135 - 145 mmol/L 139 140 138  Potassium 3.5 - 5.1 mmol/L 4.0 3.9 3.4(L)  Chloride 98 - 111 mmol/L 105 99 104  CO2 22 - 32 mmol/L 24 23 24   Calcium 8.9 - 10.3 mg/dL 8.8(L) 9.8 8.8    Current antihypertensive regimen:  Cartia XT 240m58mtake 1 capsule once daily  Adherence Review: Is the patient currently on ACE/ARB medication? No Does the patient have >5 day gap between last estimated fill dates? No   Care Gaps: Hepatitis C Screening - never done Shingrix - never done Pap Smear - never done Foot exam - Overdue Flu Vaccine - due Last BP reading was 138/90 on 12/17/2020  Star Rating Drugs: Pravastatin 40 mg  - [facility]- Last [facility] 12/07/2020 for 23 DS at [facility] Invokana 100 mg- Last filled 12/07/2020 [facility] 19 [phone]

## 2020-12-23 ENCOUNTER — Ambulatory Visit (INDEPENDENT_AMBULATORY_CARE_PROVIDER_SITE_OTHER): Payer: Medicare Other | Admitting: Family Medicine

## 2020-12-23 ENCOUNTER — Encounter: Payer: Self-pay | Admitting: Family Medicine

## 2020-12-23 VITALS — BP 146/100 | HR 86 | Temp 98.2°F | Wt 290.0 lb

## 2020-12-23 DIAGNOSIS — M797 Fibromyalgia: Secondary | ICD-10-CM

## 2020-12-23 MED ORDER — GABAPENTIN 300 MG PO CAPS: 300.0000 mg | ORAL_CAPSULE | Freq: Two times a day (BID) | ORAL | 0 refills | Status: DC

## 2020-12-23 NOTE — Progress Notes (Signed)
Subjective:    Patient ID: [name], female    DOB: [birth year], 65 y.o.   MRN: [id number]  Chief Complaint  Patient presents with   Fibromyalgia    Pain is getting worse, tylenol is not heping, cant sleep at night from the pain    HPI Patient was seen today for ongoing concern.  Patient endorses fibromyalgia flare.  States pain started increasing after she did not have some of her medicines.  Pt states she never got the pill packs.  Pt didn't have Duloxetine 60 mg  x 1 month, so she took 20 mg tabs.  Pt tried taking 2 gabapentin daily x 2 wks and 5 or 6 Tylenol 650 mg per day.  Pt's Psychiatrist Ahluwalia Shamsher Candiss Norse prescribes her cymbalta.  Past Medical History:  Diagnosis Date   Adrenal mass ([place]) 03/226   Benign   Arthritis    knees/multiple orthopedic conditons; lower back   Asthma    per pt   Clotting disorder (HCC)    +beta-2-glycoprotein IgA antibody   COPD (chronic obstructive pulmonary disease) (HCC)    inhalers dependent on environment   Depression    Diabetes mellitus    120s usually fasting -  dx more than 10 yrs ago   Dizziness, nonspecific    DVT (deep venous thrombosis) (HCC)    Recurrent   Fibromyalgia    GERD (gastroesophageal reflux disease)    Heart murmur    History of blood transfusion    History of cocaine abuse ([place])    Remote history    Hyperlipidemia    Hypertension    takes meds daily   Hypothyroidism    Lupus Anticoagulant Positive    On home oxygen therapy    at night   Shortness of breath    exertion or lying flat   Sleep apnea    2l of oxygen at night (as of 12/6, she used to)    Allergies  Allergen Reactions   Lisinopril Swelling    THROAT SWELLING Pt states her throat started to "close up" after being on it for "so long."   Corticosteroids Rash    States she developed a rash on her tongue after a steroid injection in her bac    ROS General: Denies fever, chills, night sweats, changes in weight, changes in  appetite HEENT: Denies headaches, ear pain, changes in vision, rhinorrhea, sore throat CV: Denies CP, palpitations, SOB, orthopnea Pulm: Denies SOB, cough, wheezing GI: Denies abdominal pain, nausea, vomiting, diarrhea, constipation GU: Denies dysuria, hematuria, frequency, vaginal discharge Msk: Denies muscle cramps, joint pains  +joint pain, muscle pain Neuro: Denies weakness, numbness, tingling Skin: Denies rashes, bruising Psych: Denies depression, anxiety, hallucinations     Objective:    Blood pressure (!) 146/100, pulse 86, temperature 98.2 F (36.8 C), temperature source Rectal, weight 290 lb (131.5 kg), SpO2 97 %.  Gen. Pleasant, well-nourished, in no distress, normal affect   HEENT: [place]/AT, face symmetric, conjunctiva clear, no scleral icterus, PERRLA, EOMI, nares patent without drainage Lungs: no accessory muscle use, CTAB, no wheezes or rales Cardiovascular: RRR, no m/r/g, no peripheral edema Musculoskeletal: TTP of shoulders, back, legs, arms.  No deformities, no cyanosis or clubbing, normal tone Neuro:  A&Ox3, CN II-XII intact, normal gait Skin:  Warm, no lesions/ rash   Wt Readings from Last 3 Encounters:  12/23/20 290 lb (131.5 kg)  12/17/20 287 lb 3.2 oz (130.3 kg)  11/12/20 288 lb 9.6 oz (130.9 kg)  Lab Results  Component Value Date   WBC 6.1 08/18/2020   HGB 12.9 08/18/2020   HCT 41.6 08/18/2020   PLT 343 08/18/2020   GLUCOSE 123 (H) 08/18/2020   CHOL 194 02/02/2020   TRIG 196 (H) 02/02/2020   HDL 76 02/02/2020   LDLCALC 89 02/02/2020   ALT 22 08/18/2020   AST 24 08/18/2020   NA 139 08/18/2020   K 4.0 08/18/2020   CL 105 08/18/2020   CREATININE 0.70 08/18/2020   BUN 13 08/18/2020   CO2 24 08/18/2020   TSH 4.23 06/07/2020   INR 2.4 (H) 11/12/2020   HGBA1C 7.5 (A) 09/15/2020   MICROALBUR 12.5 (H) 08/11/2020    Assessment/Plan:  Fibromyalgia  -restart regular meds.   Advised to contact pharmacy/pill pack company.  Of unable to get pill  pack, consider switching pharmacies. -will increase gabapentin to 300 mg BID -advised to limit Tylenol intake - Plan: gabapentin (NEURONTIN) 300 MG capsule  F/u prn  [name], MD

## 2020-12-27 ENCOUNTER — Telehealth: Payer: Self-pay | Admitting: Family Medicine

## 2020-12-27 ENCOUNTER — Other Ambulatory Visit: Payer: Self-pay | Admitting: Family Medicine

## 2020-12-27 NOTE — Telephone Encounter (Signed)
Janie from [employer] call and stated pt need refills DULoxetine (CYMBALTA) 60 MG capsule and DULoxetine (CYMBALTA) 20 MG capsule and zonisamide (ZONEGRAN) 100 MG capsule sent to  [place], [place] - [address] Phone:  [phone]  Fax:  [phone]

## 2020-12-31 DIAGNOSIS — J449 Chronic obstructive pulmonary disease, unspecified: Secondary | ICD-10-CM | POA: Diagnosis not present

## 2021-01-05 ENCOUNTER — Ambulatory Visit: Payer: Medicare Other | Admitting: Registered"

## 2021-01-11 ENCOUNTER — Encounter: Payer: Self-pay | Admitting: Family Medicine

## 2021-01-12 ENCOUNTER — Other Ambulatory Visit: Payer: Self-pay

## 2021-01-12 ENCOUNTER — Inpatient Hospital Stay: Payer: Medicare Other | Attending: Oncology

## 2021-01-12 DIAGNOSIS — Z86718 Personal history of other venous thrombosis and embolism: Secondary | ICD-10-CM | POA: Insufficient documentation

## 2021-01-12 DIAGNOSIS — Z7901 Long term (current) use of anticoagulants: Secondary | ICD-10-CM | POA: Diagnosis not present

## 2021-01-12 LAB — PROTIME-INR
INR: 2.4 — ABNORMAL HIGH (ref 0.8–1.2)
Prothrombin Time: 25.9 seconds — ABNORMAL HIGH (ref 11.4–15.2)

## 2021-01-13 ENCOUNTER — Telehealth: Payer: Self-pay

## 2021-01-13 NOTE — Telephone Encounter (Signed)
-----   Message from Ladell Pier, MD sent at 01/12/2021  7:16 PM EDT ----- Please call patient, same Coumadin dose, check PT/INR in 1 month

## 2021-01-13 NOTE — Telephone Encounter (Signed)
Pt informed to continue the same coumadin dose.  And Pt/INR in 1 month Pt verbalized understanding.

## 2021-01-19 ENCOUNTER — Other Ambulatory Visit: Payer: Self-pay | Admitting: Oncology

## 2021-01-19 ENCOUNTER — Other Ambulatory Visit: Payer: Self-pay | Admitting: Family Medicine

## 2021-01-19 DIAGNOSIS — E876 Hypokalemia: Secondary | ICD-10-CM

## 2021-01-20 ENCOUNTER — Telehealth: Payer: Self-pay | Admitting: Pharmacist

## 2021-01-20 ENCOUNTER — Telehealth: Payer: Self-pay | Admitting: Family Medicine

## 2021-01-20 DIAGNOSIS — M797 Fibromyalgia: Secondary | ICD-10-CM

## 2021-01-20 NOTE — Chronic Care Management (AMB) (Signed)
Chronic Care Management Pharmacy Assistant   Name: [name]  MRN: [id number] DOB: [birth year]  Reason for Encounter: Disease State and Medication Review / Hypertension Assessment Call and Medication Coordination Call   Conditions to be addressed/monitored: HTN  Recent office visits:  12/23/2020 [name] MD (PCP) - Patient was seen for fibromyalgia. Increased Gabapentin 300 mg from daily to twice daily. Follow up if symptoms fail to improve.  Recent consult visits:  None  Hospital visits:  None  Medications: Outpatient Encounter Medications as of 01/20/2021  Medication Sig   ACCU-CHEK GUIDE test strip USE UP TO FOUR TIMES DAILY AS DIRECTED.   Accu-Chek Softclix Lancets lancets USE UP TO FOUR TIMES DAILY AS DIRECTED.   albuterol (PROVENTIL HFA;VENTOLIN HFA) 108 (90 BASE) MCG/ACT inhaler Inhale 2 puffs into the lungs every 4 (four) hours as needed for shortness of breath.   albuterol (PROVENTIL) (2.5 MG/3ML) 0.083% nebulizer solution USE 1 VIAL IN NEBULIZER EVERY 6 HOURS - and as needed   blood glucose meter kit and supplies KIT Dispense based on patient and insurance preference. Use up to four times daily as directed. (FOR ICD-9 250.00, 250.01).   brexpiprazole (REXULTI) 2 MG TABS tablet Take 1 tablet by mouth daily.   budesonide-formoterol (SYMBICORT) 160-4.5 MCG/ACT inhaler Inhale 2 puffs into the lungs 2 (two) times daily.   canagliflozin (INVOKANA) 100 MG TABS tablet Take 1 tablet (100 mg total) by mouth daily before breakfast.   CARTIA XT 240 MG 24 hr capsule Take 1 capsule (240 mg total) by mouth daily.   cloNIDine (CATAPRES) 0.1 MG tablet TAKE ONE TABLET BY MOUTH BEFORE BREAKFAST and TAKE ONE TABLET BY MOUTH EVERYDAY AT BEDTIME   COVID-19 mRNA Vac-TriS, Pfizer, (PFIZER-BIONT COVID-19 VAC-TRIS) SUSP injection Inject into the muscle.   diclofenac sodium (VOLTAREN) 1 % GEL Apply 2 g topically daily as needed (fibromyalgia pain).   DULoxetine (CYMBALTA) 20 MG  capsule Take 20 mg by mouth daily.   DULoxetine (CYMBALTA) 60 MG capsule Take 60 mg by mouth at bedtime.    fluticasone (FLONASE) 50 MCG/ACT nasal spray Place 2 sprays into both nostrils 2 (two) times daily.   furosemide (LASIX) 40 MG tablet Take 1 tablet (40 mg total) by mouth daily.   gabapentin (NEURONTIN) 300 MG capsule Take 1 capsule (300 mg total) by mouth at bedtime.   gabapentin (NEURONTIN) 300 MG capsule Take 1 capsule (300 mg total) by mouth 2 (two) times daily.   hydrochlorothiazide (HYDRODIURIL) 12.5 MG tablet    levocetirizine (XYZAL) 5 MG tablet Take 1 tablet (5 mg total) by mouth at bedtime.   LINZESS 145 MCG CAPS capsule Take 1 capsule (145 mcg total) by mouth every other day.   loratadine (CLARITIN) 10 MG tablet 1 tablet   meclizine (ANTIVERT) 25 MG tablet Take 1 tablet (25 mg total) by mouth 3 (three) times daily as needed for dizziness.   metFORMIN (GLUCOPHAGE) 500 MG tablet Take 1 tablet (500 mg total) by mouth at bedtime.   metoCLOPramide (REGLAN) 10 MG tablet Take 1 tablet (10 mg total) by mouth every 6 (six) hours as needed for nausea (or headache).   omeprazole (PRILOSEC) 20 MG capsule Take 1 capsule (20 mg total) by mouth 2 (two) times daily.   potassium chloride (MICRO-K) 10 MEQ CR capsule TAKE TWO CAPSULES BY MOUTH EVERYDAY AT BEDTIME   pravastatin (PRAVACHOL) 40 MG tablet TAKE 1 TABLET(40 MG) BY MOUTH EVERY EVENING   traMADol (ULTRAM) 50 MG tablet Take by mouth. (  Patient not taking: No sig reported)   umeclidinium-vilanterol (ANORO ELLIPTA) 62.5-25 MCG/INH AEPB Inhale into the lungs.   warfarin (COUMADIN) 10 MG tablet TAKE 1 TABLET BY MOUTH ONCE A DAY ON MONDAYS, WEDNESDAYS, AND FRIDAYS   warfarin (COUMADIN) 5 MG tablet TAKE ONE TABLET BY MOUTH daily EXCEPT MONDAY, WEDNESDAY, AND Friday. OR as directed   zonisamide (ZONEGRAN) 100 MG capsule Take by mouth as directed. Take 1 tablet in the AM and 2 tablets in the PM   zonisamide (ZONEGRAN) 50 MG capsule TK 2 CS PO BID    No facility-administered encounter medications on file as of 01/20/2021.   Fill History: Rexulti 3 mg tablet 01/12/2021 22   Invokana 100 mg tablet 12/27/2020 30   duloxetine 20 mg capsule,delayed release 01/12/2021 22   furosemide 40 mg tablet 12/27/2020 30   GABAPENTIN 300MG CAPSULES 12/23/2020 30   omeprazole 20 mg capsule,delayed release 12/27/2020 30   12/21/2020 90   potassium chloride ER 10 mEq capsule,extended release 12/27/2020 30   pravastatin 40 mg tablet 01/03/2021 30   warfarin 5 mg tablet 12/27/2020 30   ZONISAMIDE 100MG CAPSULES 09/23/2020 90   clonidine HCl 0.1 mg tablet 12/27/2020 30   clonidine HCl 0.1 mg tablet 12/27/2020 30   Linzess 145 mcg capsule 12/27/2020 60   metformin 500 mg tablet 12/27/2020 30   Reviewed chart prior to disease state call.   Recent Office Vitals: BP Readings from Last 3 Encounters:  12/23/20 (!) 146/100  12/17/20 138/90  11/12/20 133/85   Pulse Readings from Last 3 Encounters:  12/23/20 86  12/17/20 92  11/12/20 86    Wt Readings from Last 3 Encounters:  12/23/20 290 lb (131.5 kg)  12/17/20 287 lb 3.2 oz (130.3 kg)  11/12/20 288 lb 9.6 oz (130.9 kg)     Kidney Function Lab Results  Component Value Date/Time   CREATININE 0.70 08/18/2020 04:27 PM   CREATININE 0.82 06/18/2020 09:09 AM   CREATININE 0.81 02/02/2020 11:26 AM   GFR 89.49 06/07/2020 10:00 AM   GFRNONAA >60 08/18/2020 04:27 PM   GFRNONAA 77 02/02/2020 11:26 AM   GFRAA 94 03/05/2020 11:55 AM   GFRAA 89 02/02/2020 11:26 AM    BMP Latest Ref Rng & Units 08/18/2020 06/18/2020 06/07/2020  Glucose 70 - 99 mg/dL 123(H) 183(H) 192(H)  BUN 8 - 23 mg/dL _0 Creatinine 0.44 - 1.00 mg/dL 0.70 0.82 0.71  BUN/Creat Ratio 12 - 28 - 20 -  Sodium 135 - 145 mmol/L 139 140 138  Potassium 3.5 - 5.1 mmol/L 4.0 3.9 3.4(L)  Chloride 98 - 111 mmol/L 105 99 104  CO2 22 - 32 mmol/L _1 Calcium 8.9 - 10.3 mg/dL 8.8(L) 9.8 8.8    Current antihypertensive  regimen:  Carti XT 240 mg daily HCTZ 12.5 mg - historical med, no sig How often are you checking your Blood Pressure?  Current home BP readings:  What recent interventions/DTPs have been made by any provider to improve Blood Pressure control since last CPP Visit: None Any recent hospitalizations or ED visits since last visit with CPP? No What diet changes have been made to improve Blood Pressure Control? What exercise is being done to improve your Blood Pressure Control?    Adherence Review: Is the patient currently on ACE/ARB medication? No Does the patient have >5 day gap between last estimated fill dates? No   Reviewed chart for medication changes ahead of medication coordination call.  No OVs, Consults, or  hospital visits since last care coordination call/Pharmacist visit. (If appropriate, list visit date, provider name)  No medication changes indicated OR if recent visit, treatment plan here.  BP Readings from Last 3 Encounters:  12/23/20 (!) 146/100  12/17/20 138/90  11/12/20 133/85    Lab Results  Component Value Date   HGBA1C 7.5 (A) 09/15/2020     Patient obtains medications through Adherence Packaging  30 Days   Last adherence delivery included: Metformin 500 mg - take 1 tablet at bedtime Pravastatin 40 mg  - TAKE 1 TABLET EVERY EVENING Clonidine 0.1 mg - take 1 tablet two times daily Invokana 100 mg - take 1 tablet before breakfast Cartia XT 240 mg - Take 1 capsule  daily Furosemide 20 mg - Take 1 tablet before breakfast Duloxetine 20 mg  - take 1 tablet before breakfast Duloxetine 60 mg - take 1 tablet at bedtime Levocetirizine 5 mg - take 1 tablet every evening Zonisamide 100 mg - Take 1 tablet in the AM and 2 tablets in the PM Potassium CL ER 58mq take 2 capsules at bedtime Gabapentin 300 mg - take 1 capsule twice daily Rexulti 3 mg - take 1 tablet at bedtime Omeprazole - 20 mg - Take 1 capsule times daily Warfarin 5 mg - TAKE 1 tablet daily except   Monday, Wednesday & Friday OR AS DIRECTED  Patient declined (meds) last month due to PRN use/additional supply on hand. None  Explanation of abundance on hand (ie #30 due to overlapping fills or previous adherence issues etc) None  Patient is due for next adherence delivery on: 02/01/2021.  Called unable to reach patient and reviewed medications and coordinated delivery.  This delivery to include: Rexulti 3 mg - take 1 tablet at bedtime Duloxetine 20 mg  - take 1 tablet before breakfast Duloxetine 60 mg - take 1 tablet at bedtime Clonidine 0.1 mg - take 1 tablet two times daily Warfarin 5 mg - TAKE 1 tablet daily except  Monday, Wednesday & Friday OR AS DIRECTED Cartia XT 240 mg - Take 1 capsule  daily Furosemide 20 mg - Take 1 tablet before breakfast Metformin 500 mg - take 1 tablet at bedtime Invokana 100 mg - take 1 tablet before breakfast Potassium CL ER 111m take 2 capsules at bedtime Pravastatin 40 mg  - TAKE 1 TABLET EVERY EVENING Omeprazole - 20 mg - Take 1 capsule 2 times daily   Care Gaps: AWV - message sent to Ro[facility] Cravero schedule Last BP - 146/100 on 12/23/2020 Hep C Screen - never done Shingrix - never done Papsmear - never done Foot exam - overdue Flu - due Covid vaccine - overdue  Star Rating Drugs: Invokana 100 mg - last filled 12/27/2020 30 DS at [facility] Pravachol 40 mg - last filled 01/03/2021 30 [id number] [phone]

## 2021-01-20 NOTE — Telephone Encounter (Signed)
Patient called asking if a refill of gabapentin (NEURONTIN) 300 MG capsule could be sent to [facility], [address] AT [address] . She states that she has three days left of medication and does not want her fibromyalgia to flare up.  Please advise.

## 2021-01-21 MED ORDER — GABAPENTIN 300 MG PO CAPS: 300.0000 mg | ORAL_CAPSULE | Freq: Two times a day (BID) | ORAL | 0 refills | Status: DC

## 2021-01-21 NOTE — Telephone Encounter (Signed)
Refill sent to requested pharmacy.

## 2021-01-28 DIAGNOSIS — J449 Chronic obstructive pulmonary disease, unspecified: Secondary | ICD-10-CM | POA: Diagnosis not present

## 2021-01-31 ENCOUNTER — Telehealth: Payer: Self-pay

## 2021-01-31 ENCOUNTER — Telehealth: Payer: Self-pay | Admitting: Internal Medicine

## 2021-01-31 NOTE — Telephone Encounter (Signed)
Caller states she has been feeling weak and tired for three months, now having shortness of breath.  Reason: Declines for follow up. Has been advised to call 911 and verbalizes understanding. States she will call office back when needed.  01/31/2021 9:28:05 AM Call EMS 911 Now Radford Pax, RN, [place] Understands Yes

## 2021-01-31 NOTE — Telephone Encounter (Signed)
Called pt in regards to SOB.  Pt reports that SOB has occurred for at least 3 months but has recently became worse.  Pt expresses that she gets winded just walking a short distance and becomes light headed at times.  Pt expresses compliance with furosemide and all meds ordered.  Reports does not add salt to her food, using inhaler as ordered , sleeps propped up on pillows and is having a hard time sleeping at night.  Pt denies pain with deep breath.  I advised pt to call 911 if SOB worsens.  OV scheduled for 02/03/21 with Dr. Gasper Sells.  Will route to MD for review.

## 2021-01-31 NOTE — Telephone Encounter (Signed)
   Pt c/o Shortness Of Breath: STAT if SOB developed within the last 24 hours or pt is noticeably SOB on the phone  1. Are you currently SOB (can you hear that pt is SOB on the phone)?   2. How long have you been experiencing SOB? 3 months and its getting worst  3. Are you SOB when sitting or when up moving around? Sitting and move around   4. Are you currently experiencing any other symptoms? Lightheadedness  Pt said for the last 3 months she has SOB and its getting worst, she said her O2 level gets as low as 93%. She also said when she gets SOB she also get lightheaded and feels like she's going to pass out. She wanted to see Dr. Gasper Sells this week

## 2021-02-02 ENCOUNTER — Ambulatory Visit (INDEPENDENT_AMBULATORY_CARE_PROVIDER_SITE_OTHER): Payer: Medicare Other

## 2021-02-02 ENCOUNTER — Ambulatory Visit (INDEPENDENT_AMBULATORY_CARE_PROVIDER_SITE_OTHER): Payer: Medicare Other | Admitting: Family Medicine

## 2021-02-02 ENCOUNTER — Other Ambulatory Visit: Payer: Self-pay

## 2021-02-02 ENCOUNTER — Encounter: Payer: Self-pay | Admitting: Family Medicine

## 2021-02-02 VITALS — BP 160/98 | HR 85 | Temp 98.2°F | Wt 290.8 lb

## 2021-02-02 DIAGNOSIS — E1140 Type 2 diabetes mellitus with diabetic neuropathy, unspecified: Secondary | ICD-10-CM

## 2021-02-02 DIAGNOSIS — E1165 Type 2 diabetes mellitus with hyperglycemia: Secondary | ICD-10-CM

## 2021-02-02 DIAGNOSIS — G4733 Obstructive sleep apnea (adult) (pediatric): Secondary | ICD-10-CM | POA: Diagnosis not present

## 2021-02-02 DIAGNOSIS — I5032 Chronic diastolic (congestive) heart failure: Secondary | ICD-10-CM

## 2021-02-02 DIAGNOSIS — M797 Fibromyalgia: Secondary | ICD-10-CM | POA: Diagnosis not present

## 2021-02-02 DIAGNOSIS — R5383 Other fatigue: Secondary | ICD-10-CM | POA: Diagnosis not present

## 2021-02-02 DIAGNOSIS — R0602 Shortness of breath: Secondary | ICD-10-CM

## 2021-02-02 DIAGNOSIS — J449 Chronic obstructive pulmonary disease, unspecified: Secondary | ICD-10-CM

## 2021-02-02 DIAGNOSIS — E559 Vitamin D deficiency, unspecified: Secondary | ICD-10-CM

## 2021-02-02 DIAGNOSIS — I10 Essential (primary) hypertension: Secondary | ICD-10-CM

## 2021-02-02 LAB — COMPREHENSIVE METABOLIC PANEL
ALT: 21 U/L (ref 0–35)
AST: 23 U/L (ref 0–37)
Albumin: 4.3 g/dL (ref 3.5–5.2)
Alkaline Phosphatase: 84 U/L (ref 39–117)
BUN: 10 mg/dL (ref 6–23)
CO2: 26 mEq/L (ref 19–32)
Calcium: 9.1 mg/dL (ref 8.4–10.5)
Chloride: 102 mEq/L (ref 96–112)
Creatinine, Ser: 0.79 mg/dL (ref 0.40–1.20)
GFR: 78.36 mL/min (ref >60.00)
Glucose, Bld: 138 mg/dL — ABNORMAL HIGH (ref 70–99)
Potassium: 3.4 mEq/L — ABNORMAL LOW (ref 3.5–5.1)
Sodium: 139 mEq/L (ref 135–145)
Total Bilirubin: 0.4 mg/dL (ref 0.2–1.2)
Total Protein: 8.1 g/dL (ref 6.0–8.3)

## 2021-02-02 LAB — VITAMIN B12: Vitamin B-12: 373 pg/mL (ref 211–911)

## 2021-02-02 LAB — VITAMIN D 25 HYDROXY (VIT D DEFICIENCY, FRACTURES): VITD: 18.14 ng/mL — ABNORMAL LOW (ref 30.00–100.00)

## 2021-02-02 LAB — CBC WITH DIFFERENTIAL/PLATELET
Basophils Absolute: 0 10*3/uL (ref 0.0–0.1)
Basophils Relative: 0.3 % (ref 0.0–3.0)
Eosinophils Absolute: 0.2 10*3/uL (ref 0.0–0.7)
Eosinophils Relative: 2.9 % (ref 0.0–5.0)
HCT: 37 % (ref 36.0–46.0)
Hemoglobin: 11.9 g/dL — ABNORMAL LOW (ref 12.0–15.0)
Lymphocytes Relative: 39.2 % (ref 12.0–46.0)
Lymphs Abs: 2.3 10*3/uL (ref 0.7–4.0)
MCHC: 32.1 g/dL (ref 30.0–36.0)
MCV: 86.4 fl (ref 78.0–100.0)
Monocytes Absolute: 0.5 10*3/uL (ref 0.1–1.0)
Monocytes Relative: 8.9 % (ref 3.0–12.0)
Neutro Abs: 2.9 10*3/uL (ref 1.4–7.7)
Neutrophils Relative %: 48.7 % (ref 43.0–77.0)
Platelets: 306 10*3/uL (ref 150.0–400.0)
RBC: 4.29 Mil/uL (ref 3.87–5.11)
RDW: 17.3 % — ABNORMAL HIGH (ref 11.5–15.5)
WBC: 5.9 10*3/uL (ref 4.0–10.5)

## 2021-02-02 LAB — HEMOGLOBIN A1C: Hgb A1c MFr Bld: 8 % — ABNORMAL HIGH (ref 4.6–6.5)

## 2021-02-02 LAB — BRAIN NATRIURETIC PEPTIDE: Pro B Natriuretic peptide (BNP): 22 pg/mL (ref 0.0–100.0)

## 2021-02-02 NOTE — Progress Notes (Signed)
Subjective:    Patient ID: [name], female    DOB: 1956/01/22, 65 y.o.   MRN: [id number]  Chief Complaint  Patient presents with   Fatigue   Shortness of Breath    HPI Patient was seen today for several ongoing concerns.  Pt endorses SOB x 3 months.  States really feels like symptoms have been ongoing s/p COVID-19 infection 02/2019.   Endorses weight gain, 3 pillow orthopnea, fatigue.  Taking lasix 40 mg daily.  States felt presyncopal while in kitchen cooking recently.  Unable to engage in activities that involve much effort 2/2 SOB.  pO2 93% at home.  Unable to wear CPAP as it "takes my breath".  Hesitant to call Sleep med provider as she is unsure of what they might say.  States had settings adjusted.  Inhalers not helping with SOB.  Pt unsure if she is getting all of her medications in the pill packs delivered to her home.  Pt does not want to take metformin as she saw a video on You tube of a dr that told her metformin was bad for her liver and kidneys.    Past Medical History:  Diagnosis Date   Adrenal mass ([place]) 03/226   Benign   Arthritis    knees/multiple orthopedic conditons; lower back   Asthma    per pt   Clotting disorder (HCC)    +beta-2-glycoprotein IgA antibody   COPD (chronic obstructive pulmonary disease) (HCC)    inhalers dependent on environment   Depression    Diabetes mellitus    120s usually fasting -  dx more than 10 yrs ago   Dizziness, nonspecific    DVT (deep venous thrombosis) (HCC)    Recurrent   Fibromyalgia    GERD (gastroesophageal reflux disease)    Heart murmur    History of blood transfusion    History of cocaine abuse ([place])    Remote history    Hyperlipidemia    Hypertension    takes meds daily   Hypothyroidism    Lupus Anticoagulant Positive    On home oxygen therapy    at night   Shortness of breath    exertion or lying flat   Sleep apnea    2l of oxygen at night (as of 12/6, she used to)    Allergies  Allergen  Reactions   Lisinopril Swelling    THROAT SWELLING Pt states her throat started to "close up" after being on it for "so long."   Corticosteroids Rash    States she developed a rash on her tongue after a steroid injection in her bac    ROS General: Denies fever, chills, night sweats, changes in weight, changes in appetite HEENT: Denies headaches, ear pain, changes in vision, rhinorrhea, sore throat CV: Denies CP, palpitations   +SOB, orthopnea Pulm: Denies cough, wheezing  +SOB GI: Denies abdominal pain, nausea, vomiting, diarrhea, constipation GU: Denies dysuria, hematuria, frequency, vaginal discharge Msk: Denies muscle cramps, joint pains  +chronic pain 2/2 fibromyalgia Neuro: Denies weakness, numbness, tingling   Skin: Denies rashes, bruising Psych: Denies depression, anxiety, hallucinations     Objective:    Blood pressure (!) 160/98, pulse 85, temperature 98.2 F (36.8 C), temperature source Oral, weight 290 lb 12.8 oz (131.9 kg), SpO2 97 %.  Gen. Pleasant, well-nourished, in no distress, normal affect   HEENT: [place]/AT, face symmetric, conjunctiva clear, no scleral icterus, PERRLA, EOMI, nares patent without drainage Lungs: no accessory muscle use, CTAB, no wheezes or  rales Cardiovascular: RRR, no m/r/g, minimal peripheral edema- sock indentation in b/l ankles no calf tenderness. Abdomen: BS present, soft, NT/ND, no hepatosplenomegaly. Musculoskeletal: No deformities, no cyanosis or clubbing, normal tone Neuro:  A&Ox3, CN II-XII intact, normal gait Skin:  Warm, no lesions/ rash.    Wt Readings from Last 3 Encounters:  02/02/21 290 lb 12.8 oz (131.9 kg)  12/23/20 290 lb (131.5 kg)  12/17/20 287 lb 3.2 oz (130.3 kg)    Lab Results  Component Value Date   WBC 6.1 08/18/2020   HGB 12.9 08/18/2020   HCT 41.6 08/18/2020   PLT 343 08/18/2020   GLUCOSE 123 (H) 08/18/2020   CHOL 194 02/02/2020   TRIG 196 (H) 02/02/2020   HDL 76 02/02/2020   LDLCALC 89 02/02/2020   ALT  22 08/18/2020   AST 24 08/18/2020   NA 139 08/18/2020   K 4.0 08/18/2020   CL 105 08/18/2020   CREATININE 0.70 08/18/2020   BUN 13 08/18/2020   CO2 24 08/18/2020   TSH 4.23 06/07/2020   INR 2.4 (H) 01/12/2021   HGBA1C 7.5 (A) 09/15/2020   MICROALBUR 12.5 (H) 08/11/2020    Assessment/Plan:  SOB (shortness of breath) -chronic.  Increased. -discussed possible causes such as deconditioning 2/2 obesity, CHF exacerbation, anemia, COPD/asthma, etc. -6MWT.  Unable to complete 2/2 pt fatigue pO2 93% when pt felt tired. -obtain labs and imaging -encouraged to keep f/u appt with Cardiology tomorrow 02/03/21.  - Plan: DG Chest 2 View, CMP, Brain Natriuretic Peptide, CBC with Differential/Platelet  Chronic heart failure with preserved ejection fraction (HCC)  - Plan: DG Chest 2 View, CMP, Brain Natriuretic Peptide  Chronic obstructive pulmonary disease, unspecified COPD type ([place])  - Plan: DG Chest 2 View  Essential hypertension  -uncontrolled -lifestyle modifications strongly encouraged. -continue current medications. -keep f/u with [facility] - Plan: CMP  Other fatigue  - Plan: Vitamin B12, Vitamin D, 25-hydroxy  Fibromyalgia -continue current medications including cymbalta and gabapenin -pt advised to f/u with her psychiatrist.  Uncontrolled type 2 diabetes mellitus with hyperglycemia ([place]) -A1C 7.5% -continue current meds including Metformin -lifestyle modifications encouraged  OSA (obstructive sleep apnea)  -pt encouraged to contact sleep medicine - Plan: Hemoglobin A1c  Update: vitamin D low at 18.14.  Ergocalciferol sent to pharmacy.  Potassium 3.4.  Take and extra dose of Micro-K 10 mEq for the next 3 days for a total dose of 30 mEq. Then return to normal dosing.  F/u prn  [name], MD

## 2021-02-02 NOTE — Progress Notes (Signed)
Cardiology Office Note:    Date:  02/03/2021   ID:  [name], DOB 09-Mar-1956, MRN [id number]  PCP:  Billie Ruddy, MD  [facility] Cardiologist:  [name] MD [place] Electrophysiologist:  None   CC: DOD SOB  History of Present Illness:    [name] is a 65 y.o. 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  In interim had ziopatch with no concerning findings.  Last seen 04/13/20.  In interim of this visit, patient CCTA and Echo- notable from improved LAP and aortic atherosclerosis, and a hiatal hernia.  During that visit started new lasix dose.  Seen 12/17/20.  Patient called in 01/31/21 for worsening SOB, PND, and orthopnea.  Added to DOD 02/03/21.  Patient notes that she is having worsening breath:  when she walks she gets really tired with exertion.  She checks her oxygen numbers with exertion drops to 93%.  At night she has PND or orthopnea.    She cannot lay flat because of this. She cannot use her CPAP because of PND.  No fevers, chills, night sweats.  Had CXR with no consolidation.  Notes weight gain.  Denies CP or chest pressure.  Patient has an event Saturday that she cannot miss.  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   Past Medical History:  Diagnosis Date   Adrenal mass ([place]) 03/226   Benign   Arthritis    knees/multiple orthopedic conditons; lower back   Asthma    per pt   Clotting disorder (HCC)    +beta-2-glycoprotein IgA antibody   COPD (chronic obstructive pulmonary disease) (HCC)    inhalers dependent on environment   Depression    Diabetes mellitus    120s usually fasting -  dx more than 10 yrs ago   Dizziness, nonspecific    DVT (deep venous thrombosis) (HCC)    Recurrent   Fibromyalgia    GERD (gastroesophageal reflux  disease)    Heart murmur    History of blood transfusion    History of cocaine abuse ([place])    Remote history    Hyperlipidemia    Hypertension    takes meds daily   Hypothyroidism    Lupus Anticoagulant Positive    On home oxygen therapy    at night   Shortness of breath    exertion or lying flat   Sleep apnea    2l of oxygen at night (as of 12/6, she used to)    Past Surgical History:  Procedure Laterality Date   ABDOMINAL HYSTERECTOMY  1989   Fibroids   ANTERIOR LUMBAR FUSION  01/03/2012   Procedure: ANTERIOR LUMBAR FUSION 1 LEVEL;  Surgeon: Winfield Cunas, MD;  Location: [facility];  Service: Neurosurgery;  Laterality: N/A;  Lumbar Four-Five Anterior Lumbar Interbody Fusion with Instrumentation   BACK SURGERY     cervical spine---disk disease   CARPAL TUNNEL RELEASE     Bilateral   CESAREAN SECTION     CHOLECYSTECTOMY     CYSTOSCOPY/RETROGRADE/URETEROSCOPY/STONE EXTRACTION WITH BASKET Right 04/26/2014   Procedure: CYSTOSCOPY/ RIGHT RETROGRADE/ RIGHT URETEROSCOPY/URETERAL AND RENAL PELVIS BIOPSY;  Surgeon: Alexis Frock, MD;  Location: [facility];  Service: Urology;  Laterality: Right;   gallstones removed     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  bilateral   right elbow surgery     THYROID LOBECTOMY Right 02/27/2017   THYROID LOBECTOMY Right 02/27/2017   Procedure: RIGHT THYROID LOBECTOMY;  Surgeon: Erroll Luna, MD;  Location: [facility];  Service: General;  Laterality: Right;   TUBAL LIGATION      Current Medications: Current Meds  Medication Sig   ACCU-CHEK GUIDE test strip USE UP TO FOUR TIMES DAILY AS DIRECTED.   Accu-Chek Softclix Lancets lancets USE UP TO FOUR TIMES DAILY AS DIRECTED.   albuterol (PROVENTIL HFA;VENTOLIN HFA) 108 (90 BASE) MCG/ACT inhaler Inhale 2 puffs into the lungs every 4 (four) hours as needed for shortness of breath.   albuterol (PROVENTIL) (2.5 MG/3ML) 0.083% nebulizer solution USE 1 VIAL IN NEBULIZER EVERY 6 HOURS - and as needed   blood  glucose meter kit and supplies KIT Dispense based on patient and insurance preference. Use up to four times daily as directed. (FOR ICD-9 250.00, 250.01).   brexpiprazole (REXULTI) 2 MG TABS tablet Take 1 tablet by mouth daily.   budesonide-formoterol (SYMBICORT) 160-4.5 MCG/ACT inhaler Inhale 2 puffs into the lungs 2 (two) times daily.   canagliflozin (INVOKANA) 100 MG TABS tablet Take 1 tablet (100 mg total) by mouth daily before breakfast.   CARTIA XT 240 MG 24 hr capsule Take 1 capsule (240 mg total) by mouth daily.   cloNIDine (CATAPRES) 0.1 MG tablet TAKE ONE TABLET BY MOUTH BEFORE BREAKFAST and TAKE ONE TABLET BY MOUTH EVERYDAY AT BEDTIME   diclofenac sodium (VOLTAREN) 1 % GEL Apply 2 g topically daily as needed (fibromyalgia pain).   DULoxetine (CYMBALTA) 20 MG capsule Take 20 mg by mouth daily.   DULoxetine (CYMBALTA) 60 MG capsule Take 60 mg by mouth at bedtime.    fluticasone (FLONASE) 50 MCG/ACT nasal spray Place 2 sprays into both nostrils 2 (two) times daily.   furosemide (LASIX) 80 MG tablet Take 1 tablet (80 mg total) by mouth 2 (two) times daily.   gabapentin (NEURONTIN) 300 MG capsule Take 1 capsule (300 mg total) by mouth 2 (two) times daily.   levocetirizine (XYZAL) 5 MG tablet Take 1 tablet (5 mg total) by mouth at bedtime.   LINZESS 145 MCG CAPS capsule Take 1 capsule (145 mcg total) by mouth every other day.   meclizine (ANTIVERT) 25 MG tablet Take 1 tablet (25 mg total) by mouth 3 (three) times daily as needed for dizziness.   metFORMIN (GLUCOPHAGE) 500 MG tablet Take 1 tablet (500 mg total) by mouth at bedtime.   metoCLOPramide (REGLAN) 10 MG tablet Take 1 tablet (10 mg total) by mouth every 6 (six) hours as needed for nausea (or headache).   omeprazole (PRILOSEC) 20 MG capsule Take 1 capsule (20 mg total) by mouth 2 (two) times daily.   potassium chloride (MICRO-K) 10 MEQ CR capsule TAKE TWO CAPSULES BY MOUTH EVERYDAY AT BEDTIME   pravastatin (PRAVACHOL) 40 MG tablet TAKE  1 TABLET(40 MG) BY MOUTH EVERY EVENING   spironolactone (ALDACTONE) 25 MG tablet Take 0.5 tablets (12.5 mg total) by mouth daily.   umeclidinium-vilanterol (ANORO ELLIPTA) 62.5-25 MCG/INH AEPB Inhale into the lungs.   Vitamin D, Ergocalciferol, (DRISDOL) 1.25 MG (50000 UNIT) CAPS capsule Take 1 capsule (50,000 Units total) by mouth every 7 (seven) days.   warfarin (COUMADIN) 10 MG tablet TAKE 1 TABLET BY MOUTH ONCE A DAY ON MONDAYS, WEDNESDAYS, AND FRIDAYS   warfarin (COUMADIN) 5 MG tablet TAKE ONE TABLET BY MOUTH daily EXCEPT MONDAY, WEDNESDAY, AND Friday. OR as directed (Patient  taking differently: Take 5 mg by mouth. TAKE ONE TABLET BY MOUTH daily on TUESDAY, Thursday, SATURDAY AND SUNDAY.)   zonisamide (ZONEGRAN) 50 MG capsule 1 TABLET AM AND 2 TABLETS PM   [DISCONTINUED] furosemide (LASIX) 40 MG tablet Take 1 tablet (40 mg total) by mouth daily.    Allergies:   Lisinopril and Corticosteroids   Social History   Socioeconomic History   Marital status: Divorced    Spouse name: Not on file   Number of children: Not on file   Years of education: Not on file   Highest education level: Not on file  Occupational History   Not on file  Tobacco Use   Smoking status: Never   Smokeless tobacco: Never  Vaping Use   Vaping Use: Never used  Substance and Sexual Activity   Alcohol use: No    Alcohol/week: 0.0 standard drinks    Comment: beer in past    Drug use: Yes    Types: Cocaine    Comment: quit 2003-rehab progam in 2005   Sexual activity: Never  Other Topics Concern   Not on file  Social History Narrative   Lives alone in apartment, drives   Social Determinants of Health   Financial Resource Strain: Low Risk    Difficulty of Paying Living Expenses: Not hard at all  Food Insecurity: No Food Insecurity   Worried About Charity fundraiser in the Last Year: Never true   [place] in the Last Year: Never true  Transportation Needs: No Transportation Needs   Lack of  Transportation (Medical): No   Lack of Transportation (Non-Medical): No  Physical Activity: Inactive   Days of Exercise per Week: 0 days   Minutes of Exercise per Session: 0 min  Stress: No Stress Concern Present   Feeling of Stress : Not at all  Social Connections: Moderately Isolated   Frequency of Communication with Friends and Family: More than three times a week   Frequency of Social Gatherings with Friends and Family: More than three times a week   Attends Religious Services: More than 4 times per year   Active Member of [employer] or Organizations: No   Attends Music therapist: Never   Marital Status: Divorced    Family History: The patient's family history includes COPD in an other family member; Cancer in her mother; Diabetes in her mother; Heart failure in her father; Hyperlipidemia in an other family member; Hypertension in her father and mother. History of coronary artery disease notable for no members. History of heart failure notable for both parents. History of arrhythmia notable for no members. Denies family history of sudden cardiac death including drowning, car accidents, or unexplained deaths in the family. Mother->heart murmur  ROS:   Please see the history of present illness.    All other systems reviewed and are negative.  EKGs/Labs/Other Studies Reviewed:    The following studies were reviewed today:  EKG:  02/03/21: SR rate 80 LVH 04/13/20: SR 92 LVH 02/02/20:  SR Rate 85 LVH with secondary repolarization  Transthoracic Echocardiogram Date:05/04/20  Results:   IMPRESSIONS   1. Left ventricular ejection fraction, by estimation, is 60 to 65%. The  left ventricle has normal function. The left ventricle has no regional  wall motion abnormalities. Left ventricular diastolic parameters are  consistent with Grade I diastolic  dysfunction (impaired relaxation).   2. Right ventricular systolic function is normal. The right ventricular  size is  normal.   3.  The mitral valve is normal in structure. No evidence of mitral valve  regurgitation. No evidence of mitral stenosis.   4. The aortic valve is normal in structure. Aortic valve regurgitation is  not visualized.    Cardiac Monitor: Date: 03/25/20 Results:  Patient had a minimum heart rate of 67 bpm, maximum heart rate of 164 bpm, and average heart rate of 88 bpm. Predominant underlying rhythm was sinus rhythm. One run of ventricular tachycardia occurred lasting 5 beats at longest with a max rate of 164 bpm at fastest. Thirteen runs of supraventricular tachycardia occurred lasting 14 beats at longest with a max rate of 162 bpm at fastest. Isolated PACs were rare (<1.0%), with rare couplets and triplets present. Isolated PVCs were rare (<1.0%), with rare couplets and triplets present; bigeminy and trigeminy present. No evidence of complete heart block . Triggered and diary events associated overwhelming with sinus rhythm or sinus tachycardia; once with PVC.   No malignant arrhythmias.   CardiacCT : Date:05/07/20 Results: IMPRESSION: 1. Coronary calcium score of 0. This was 1st percentile for age, sex, and race matched control. 2. Normal coronary origin with right dominance. 3. CAD-RADS 0. No evidence of CAD (0%). Consider non-atherosclerotic causes of discomfort.   IMPRESSION: 1. No acute findings in the imaged extracardiac chest. 2. Hepatic steatosis. 3. Small hiatal hernia.  Recent Labs: 06/07/2020: TSH 4.23 06/18/2020: Magnesium 1.8 02/02/2021: ALT 21; BUN 10; Creatinine, Ser 0.79; Hemoglobin 11.9; Platelets 306.0; Potassium 3.4; Pro B Natriuretic peptide (BNP) 22.0; Sodium 139  Recent Lipid Panel    Component Value Date/Time   CHOL 194 02/02/2020 1126   TRIG 196 (H) 02/02/2020 1126   HDL 76 02/02/2020 1126   CHOLHDL 2.6 02/02/2020 1126   VLDL 20 04/24/2014 2245   LDLCALC 89 02/02/2020 1126    Physical Exam:    VS:  BP (!) 142/92   Pulse 80   Ht _0   (1.676 m)   Wt 292 lb (132.5 kg)   SpO2 96%   BMI 47.13 kg/m     Wt Readings from Last 3 Encounters:  02/03/21 292 lb (132.5 kg)  02/02/21 290 lb 12.8 oz (131.9 kg)  12/23/20 290 lb (131.5 kg)    Gen: Mild distress, Morbid obesity Neck: No JVD,  thick neck Cardiac: No Rubs or Gallops, no Murmur, regular rate and  radial pulses Respiratory: Clear to auscultation bilaterally, tachypnea on exam, increased  respiratory rate, slightly purse GI: Soft, nontender, distended with a positive fluid wave MS: Non-pitting edema;  moves all extremities Integument: Skin feels warm Neuro:  At time of evaluation, alert and oriented to person/place/time/situation  Psych: Normal affect, patient feels unwell   ASSESSMENT:    1. Acute on chronic heart failure with preserved ejection fraction (HCC)   2. Obesity, diabetes, and hypertension syndrome (HCC)      PLAN:    In order of problems listed above:  Acute on Chronic Heart Failure Preserved Ejection Fraction  In the setting of Morbid Obesity, COPD and OSA overlap DVT on chronic AC - NYHA class IV, Stage C, etiology in the setting of DM/HTN - She is unable to lay flat-> I offered her admission and she declined:  she is oriented and her friend will be staying with her.  We discussed that if increased diuretics do not help we will have to admit her - Diuretic regimen:Continue Invokana, will increase lasix to 80 mg PO BID and add aldactone 12.4 - needs DOD visit for Monday, BNP and  BMP  Morbid Obesity/DM/HTN OSA on CPAP - OSA and is on CPAP follow by OSH Sleep Medicine  HLD - given no significant aortic atherosclerosis on my review -  LDL Newbold < 100  Asx SVT on heart monitor - no AF; no symptomatic SVT - AV Nodal Therapy: continue diltiazem  Scheduling for this Monday; if goes to the ED please check BNP and BMP     Medication Adjustments/Labs and Tests Ordered: Current medicines are reviewed at length with the patient today.   Concerns regarding medicines are outlined above.  Orders Placed This Encounter  Procedures   EKG 12-Lead    Meds ordered this encounter  Medications   furosemide (LASIX) 80 MG tablet    Sig: Take 1 tablet (80 mg total) by mouth 2 (two) times daily.    Dispense:  30 tablet    Refill:  1   spironolactone (ALDACTONE) 25 MG tablet    Sig: Take 0.5 tablets (12.5 mg total) by mouth daily.    Dispense:  45 tablet    Refill:  3     Patient Instructions  Medication Instructions:  Your physician has recommended you make the following change in your medication:  INCREASE: furosemide (Lasix) to 80 mg by mouth twice daily START: spironolactone (Aldactone) 12.5 mg by mouth daily  *If you need a refill on your cardiac medications before your next appointment, please call your pharmacy*   Lab Work: NONE If you have labs (blood work) drawn today and your tests are completely normal, you will receive your results only by: [place] (if you have MyChart) OR A paper copy in the mail If you have any lab test that is abnormal or we need to change your treatment, we will call you to review the results.   Testing/Procedures: NONE   Follow-Up: At [facility], you and your health needs are our priority.  As part of our continuing mission to provide you with exceptional heart care, we have created designated Provider Care Teams.  These Care Teams include your primary Cardiologist (physician) and Advanced Practice Providers (APPs -  Physician Assistants and Nurse Practitioners) who all work together to provide you with the care you need, when you need it.    Your next appointment:   4 day(s)  The format for your next appointment:   In Person  Provider:   Gwyndolyn Kaufman, MD  :1}    Signed, Werner Lean, MD  02/03/2021 10:53 AM    [place]

## 2021-02-03 ENCOUNTER — Ambulatory Visit (INDEPENDENT_AMBULATORY_CARE_PROVIDER_SITE_OTHER): Payer: Medicare Other | Admitting: Internal Medicine

## 2021-02-03 ENCOUNTER — Encounter: Payer: Self-pay | Admitting: Internal Medicine

## 2021-02-03 VITALS — BP 142/92 | HR 80 | Ht 66.0 in | Wt 292.0 lb

## 2021-02-03 DIAGNOSIS — E1159 Type 2 diabetes mellitus with other circulatory complications: Secondary | ICD-10-CM | POA: Diagnosis not present

## 2021-02-03 DIAGNOSIS — I152 Hypertension secondary to endocrine disorders: Secondary | ICD-10-CM | POA: Diagnosis not present

## 2021-02-03 DIAGNOSIS — E669 Obesity, unspecified: Secondary | ICD-10-CM

## 2021-02-03 DIAGNOSIS — I5033 Acute on chronic diastolic (congestive) heart failure: Secondary | ICD-10-CM

## 2021-02-03 DIAGNOSIS — E1169 Type 2 diabetes mellitus with other specified complication: Secondary | ICD-10-CM

## 2021-02-03 MED ORDER — SPIRONOLACTONE 25 MG PO TABS: 12.5000 mg | ORAL_TABLET | Freq: Every day | ORAL | 3 refills | Status: DC

## 2021-02-03 MED ORDER — FUROSEMIDE 80 MG PO TABS: 80.0000 mg | ORAL_TABLET | Freq: Two times a day (BID) | ORAL | 1 refills | Status: DC

## 2021-02-03 MED ORDER — VITAMIN D (ERGOCALCIFEROL) 1.25 MG (50000 UNIT) PO CAPS: 50000.0000 [IU] | ORAL_CAPSULE | ORAL | 0 refills | Status: DC

## 2021-02-03 NOTE — Patient Instructions (Signed)
Medication Instructions:  Your physician has recommended you make the following change in your medication:  INCREASE: furosemide (Lasix) to 80 mg by mouth twice daily START: spironolactone (Aldactone) 12.5 mg by mouth daily  *If you need a refill on your cardiac medications before your next appointment, please call your pharmacy*   Lab Work: NONE If you have labs (blood work) drawn today and your tests are completely normal, you will receive your results only by: [place] (if you have MyChart) OR A paper copy in the mail If you have any lab test that is abnormal or we need to change your treatment, we will call you to review the results.   Testing/Procedures: NONE   Follow-Up: At [facility], you and your health needs are our priority.  As part of our continuing mission to provide you with exceptional heart care, we have created designated Provider Care Teams.  These Care Teams include your primary Cardiologist (physician) and Advanced Practice Providers (APPs -  Physician Assistants and Nurse Practitioners) who all work together to provide you with the care you need, when you need it.    Your next appointment:   4 day(s)  The format for your next appointment:   In Person  Provider:   Gwyndolyn Kaufman, MD  :1}

## 2021-02-04 NOTE — Progress Notes (Signed)
Cardiology Office Note:    Date:  02/07/2021   ID:  [name], DOB [birth year], MRN [id number]  PCP:  Billie Ruddy, MD   [facility] Providers Cardiologist:  None {     Referring MD: Billie Ruddy, MD    History of Present Illness:    [name] is a 65 y.o. 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 Gasper Sells on 02/03/21 for acute HF who is now presenting as urgent visit for follow-up of decompensated HF.  During her visit on 02/03/21, the patient had worsening SOB, PND, and orthopnea. TTE 05/04/20 with LVEF 60-65%, G1DD, normal RV, no significant valve disease. Cardiac monitor 03/25/20 with NSVT (5 beats), 13 runs of SVT (longest 14 beats), rare PACs, rare PVCs. CTA coronaries with no obstructive disease. Calcium score 0. During her visit, the patient's lasix was increased to 67m BID and aldactone 12.58mdaily was added.  Today, the patient states that she is overall feeling better compared to last week. Believes swelling is improving. She continues to have orthopnea and sleeps on 3 pillow and PND where she wakes up twice per night feeling short of breath. Has been taking her medications as prescribed (missed clonidine this AM) and states she is urinating very frequently with the lasix. We discussed that she remains grossly overloaded on exam today. She would like to avoid hospitalization if at all possible and wants to see how she does taking the lasix and adhering to a low Na diet. She is amenable to go to the hospital if symptoms worsen or if renal function worsening on labs today.   Past Medical History:  Diagnosis Date   Adrenal mass ([facility]/0228   Benign   Arthritis    knees/multiple orthopedic conditons; lower back   Asthma    per pt   Clotting disorder (HCC)    +beta-2-glycoprotein IgA antibody   COPD (chronic obstructive pulmonary disease)  (HCC)    inhalers dependent on environment   Depression    Diabetes mellitus    120s usually fasting -  dx more than 10 yrs ago   Dizziness, nonspecific    DVT (deep venous thrombosis) (HCC)    Recurrent   Fibromyalgia    GERD (gastroesophageal reflux disease)    Heart murmur    History of blood transfusion    History of cocaine abuse ([place]   Remote history    Hyperlipidemia    Hypertension    takes meds daily   Hypothyroidism    Lupus Anticoagulant Positive    On home oxygen therapy    at night   Shortness of breath    exertion or lying flat   Sleep apnea    2l of oxygen at night (as of 12/6, she used to)    Past Surgical History:  Procedure Laterality Date   ABDOMINAL HYSTERECTOMY  1989   Fibroids   ANTERIOR LUMBAR FUSION  01/03/2012   Procedure: ANTERIOR LUMBAR FUSION 1 LEVEL;  Surgeon: KyWinfield CunasMD;  [facility];  Service: Neurosurgery;  Laterality: N/A;  Lumbar Four-Five Anterior Lumbar Interbody Fusion with Instrumentation   BACK SURGERY     cervical spine---disk disease   CARPAL TUNNEL RELEASE     Bilateral   CESAREAN SECTION     CHOLECYSTECTOMY     CYSTOSCOPY/RETROGRADE/URETEROSCOPY/STONE EXTRACTION WITH BASKET Right 04/26/2014  Procedure: CYSTOSCOPY/ RIGHT RETROGRADE/ RIGHT URETEROSCOPY/URETERAL AND RENAL PELVIS BIOPSY;  Surgeon: Alexis Frock, MD;  Location: [facility];  Service: Urology;  Laterality: Right;   gallstones removed     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   bilateral   right elbow surgery     THYROID LOBECTOMY Right 02/27/2017   THYROID LOBECTOMY Right 02/27/2017   Procedure: RIGHT THYROID LOBECTOMY;  Surgeon: Erroll Luna, MD;  Location: [place];  Service: General;  Laterality: Right;   TUBAL LIGATION      Current Medications: Current Meds  Medication Sig   ACCU-CHEK GUIDE test strip USE UP TO FOUR TIMES DAILY AS DIRECTED.   Accu-Chek Softclix Lancets lancets USE UP TO FOUR TIMES DAILY AS DIRECTED.   albuterol (PROVENTIL  HFA;VENTOLIN HFA) 108 (90 BASE) MCG/ACT inhaler Inhale 2 puffs into the lungs every 4 (four) hours as needed for shortness of breath.   albuterol (PROVENTIL) (2.5 MG/3ML) 0.083% nebulizer solution USE 1 VIAL IN NEBULIZER EVERY 6 HOURS - and as needed   blood glucose meter kit and supplies KIT Dispense based on patient and insurance preference. Use up to four times daily as directed. (FOR ICD-9 250.00, 250.01).   brexpiprazole (REXULTI) 2 MG TABS tablet Take 1 tablet by mouth daily.   budesonide-formoterol (SYMBICORT) 160-4.5 MCG/ACT inhaler Inhale 2 puffs into the lungs 2 (two) times daily.   canagliflozin (INVOKANA) 100 MG TABS tablet Take 1 tablet (100 mg total) by mouth daily before breakfast.   CARTIA XT 240 MG 24 hr capsule Take 1 capsule (240 mg total) by mouth daily.   cloNIDine (CATAPRES) 0.1 MG tablet TAKE ONE TABLET BY MOUTH BEFORE BREAKFAST and TAKE ONE TABLET BY MOUTH EVERYDAY AT BEDTIME   diclofenac sodium (VOLTAREN) 1 % GEL Apply 2 g topically daily as needed (fibromyalgia pain).   DULoxetine (CYMBALTA) 20 MG capsule Take 20 mg by mouth daily.   DULoxetine (CYMBALTA) 60 MG capsule Take 60 mg by mouth at bedtime.    fluticasone (FLONASE) 50 MCG/ACT nasal spray Place 2 sprays into both nostrils 2 (two) times daily.   furosemide (LASIX) 80 MG tablet Take 1 tablet (80 mg total) by mouth 2 (two) times daily.   gabapentin (NEURONTIN) 300 MG capsule Take 1 capsule (300 mg total) by mouth 2 (two) times daily.   hydrochlorothiazide (HYDRODIURIL) 12.5 MG tablet    levocetirizine (XYZAL) 5 MG tablet Take 1 tablet (5 mg total) by mouth at bedtime.   LINZESS 145 MCG CAPS capsule Take 1 capsule (145 mcg total) by mouth every other day.   meclizine (ANTIVERT) 25 MG tablet Take 1 tablet (25 mg total) by mouth 3 (three) times daily as needed for dizziness.   metFORMIN (GLUCOPHAGE) 500 MG tablet Take 1 tablet (500 mg total) by mouth at bedtime.   metoCLOPramide (REGLAN) 10 MG tablet Take 1 tablet (10  mg total) by mouth every 6 (six) hours as needed for nausea (or headache).   omeprazole (PRILOSEC) 20 MG capsule Take 1 capsule (20 mg total) by mouth 2 (two) times daily.   potassium chloride (MICRO-K) 10 MEQ CR capsule TAKE TWO CAPSULES BY MOUTH EVERYDAY AT BEDTIME   pravastatin (PRAVACHOL) 40 MG tablet TAKE 1 TABLET(40 MG) BY MOUTH EVERY EVENING   spironolactone (ALDACTONE) 25 MG tablet Take 0.5 tablets (12.5 mg total) by mouth daily.   umeclidinium-vilanterol (ANORO ELLIPTA) 62.5-25 MCG/INH AEPB Inhale into the lungs.   Vitamin D, Ergocalciferol, (DRISDOL) 1.25 MG (50000 UNIT) CAPS capsule Take 1 capsule (50,000 Units total)  by mouth every 7 (seven) days.   warfarin (COUMADIN) 10 MG tablet TAKE 1 TABLET BY MOUTH ONCE A DAY ON MONDAYS, WEDNESDAYS, AND FRIDAYS   warfarin (COUMADIN) 5 MG tablet TAKE ONE TABLET BY MOUTH daily EXCEPT MONDAY, WEDNESDAY, AND Friday. OR as directed   zonisamide (ZONEGRAN) 50 MG capsule 1 TABLET AM AND 2 TABLETS PM     Allergies:   Lisinopril and Corticosteroids   Social History   Socioeconomic History   Marital status: Divorced    Spouse name: Not on file   Number of children: Not on file   Years of education: Not on file   Highest education level: Not on file  Occupational History   Not on file  Tobacco Use   Smoking status: Never   Smokeless tobacco: Never  Vaping Use   Vaping Use: Never used  Substance and Sexual Activity   Alcohol use: No    Alcohol/week: 0.0 standard drinks    Comment: beer in past    Drug use: Yes    Types: Cocaine    Comment: quit 2003-rehab progam in 2005   Sexual activity: Never  Other Topics Concern   Not on file  Social History Narrative   Lives alone in apartment, drives   Social Determinants of Health   Financial Resource Strain: Low Risk    Difficulty of Paying Living Expenses: Not hard at all  Food Insecurity: No Food Insecurity   Worried About Charity fundraiser in the Last Year: Never true   [place] in the Last Year: Never true  Transportation Needs: No Transportation Needs   Lack of Transportation (Medical): No   Lack of Transportation (Non-Medical): No  Physical Activity: Inactive   Days of Exercise per Week: 0 days   Minutes of Exercise per Session: 0 min  Stress: No Stress Concern Present   Feeling of Stress : Not at all  Social Connections: Moderately Isolated   Frequency of Communication with Friends and Family: More than three times a week   Frequency of Social Gatherings with Friends and Family: More than three times a week   Attends Religious Services: More than 4 times per year   Active Member of [employer] or Organizations: No   Attends Music therapist: Never   Marital Status: Divorced     Family History: The patient's family history includes COPD in an other family member; Cancer in her mother; Diabetes in her mother; Heart failure in her father; Hyperlipidemia in an other family member; Hypertension in her father and mother.  ROS:   Please see the history of present illness.    Review of Systems  Constitutional:  Positive for malaise/fatigue. Negative for chills and fever.  Eyes:  Negative for blurred vision.  Respiratory:  Positive for shortness of breath.   Cardiovascular:  Positive for orthopnea, leg swelling and PND. Negative for chest pain, palpitations and claudication.  Gastrointestinal:  Positive for nausea.  Genitourinary:  Negative for flank pain.  Musculoskeletal:  Negative for falls.  Neurological:  Negative for dizziness and loss of consciousness.    EKGs/Labs/Other Studies Reviewed:    The following studies were reviewed today: Transthoracic Echocardiogram Date:05/04/20  Results:   IMPRESSIONS   1. Left ventricular ejection fraction, by estimation, is 60 to 65%. The  left ventricle has normal function. The left ventricle has no regional  wall motion abnormalities. Left ventricular diastolic parameters are  consistent with Grade I  diastolic  dysfunction (impaired relaxation).  2. Right ventricular systolic function is normal. The right ventricular  size is normal.   3. The mitral valve is normal in structure. No evidence of mitral valve  regurgitation. No evidence of mitral stenosis.   4. The aortic valve is normal in structure. Aortic valve regurgitation is  not visualized.      Cardiac Monitor: Date: 03/25/20 Results:   Patient had a minimum heart rate of 67 bpm, maximum heart rate of 164 bpm, and average heart rate of 88 bpm. Predominant underlying rhythm was sinus rhythm. One run of ventricular tachycardia occurred lasting 5 beats at longest with a max rate of 164 bpm at fastest. Thirteen runs of supraventricular tachycardia occurred lasting 14 beats at longest with a max rate of 162 bpm at fastest. Isolated PACs were rare (<1.0%), with rare couplets and triplets present. Isolated PVCs were rare (<1.0%), with rare couplets and triplets present; bigeminy and trigeminy present. No evidence of complete heart block . Triggered and diary events associated overwhelming with sinus rhythm or sinus tachycardia; once with PVC.   No malignant arrhythmias.     CardiacCT : Date:05/07/20 Results: IMPRESSION: 1. Coronary calcium score of 0. This was 1st percentile for age, sex, and race matched control. 2. Normal coronary origin with right dominance. 3. CAD-RADS 0. No evidence of CAD (0%). Consider non-atherosclerotic causes of discomfort.     IMPRESSION: 1. No acute findings in the imaged extracardiac chest. 2. Hepatic steatosis. 3. Small hiatal hernia.    EKG:  No new ECG today  Recent Labs: 06/07/2020: TSH 4.23 06/18/2020: Magnesium 1.8 02/02/2021: ALT 21; BUN 10; Creatinine, Ser 0.79; Hemoglobin 11.9; Platelets 306.0; Potassium 3.4; Pro B Natriuretic peptide (BNP) 22.0; Sodium 139  Recent Lipid Panel    Component Value Date/Time   CHOL 194 02/02/2020 1126   TRIG 196 (H) 02/02/2020 1126   HDL 76  02/02/2020 1126   CHOLHDL 2.6 02/02/2020 1126   VLDL 20 04/24/2014 2245   LDLCALC 89 02/02/2020 1126     Risk Assessment/Calculations:           Physical Exam:    VS:  BP (!) 156/96   Pulse 90   Ht _0  (1.676 m)   Wt 293 lb (132.9 kg)   SpO2 94%   BMI 47.29 kg/m     Wt Readings from Last 3 Encounters:  02/07/21 293 lb (132.9 kg)  02/03/21 292 lb (132.5 kg)  02/02/21 290 lb 12.8 oz (131.9 kg)     GEN: Sitting in the chair, comfortable at rest HEENT: Normal NECK: JVD difficult to assess due to body habitus CARDIAC: RRR, no murmurs, rubs, gallops RESPIRATORY:  CTAB ABDOMEN: Soft, non-tender, non-distended MUSCULOSKELETAL:  1+ edema in the hips. Warm. SKIN: Warm and dry NEUROLOGIC:  Alert and oriented x 3 PSYCHIATRIC:  Normal affect   ASSESSMENT:    1. Acute on chronic heart failure with preserved ejection fraction ([place])   2. Medication management   3. Palpitations   4. Primary hypertension   5. Mixed hyperlipidemia   6. OSA (obstructive sleep apnea)   7. History of DVT (deep vein thrombosis)    PLAN:    In order of problems listed above:  #Acute on Chronic HFpEF exacerbation: Presented to clinic on 11/17 with worsening dyspnea, orthopnea and PND with NYHA class IV HF symptoms. Her lasix was increase to 31m BID with improvement but still with significant symptoms.TTE 04/2020 with normal EF, G1DD. No coronary disease on CTA 04/2020. Calcium score 0. We  discussed that likely the best option is going to the hospital for IV diuresis, however, she would really like to avoid this at this time. Will follow-up renal function and BNP. If concerning, she is amenable to go to the hospital. Fortunately, she reports good urination to the lasix although weight is unchanged. We also discussed low Na diet at length today as likely the primary driver of volume overload. -Lasix 80 BID -Check BMET, Mg and BNP today -If renal function worsening or symptoms worsening, patient is  amenable to going to the hospital for IV diuresis. She would like to defer at this time -Pending renal function, can increase spiro to 25mg  daily -Continue canagliflozin 100mg  daily -Will scheduled close follow-up to reassess progress next week -Monitor daily weights -Low Na diet--discussed at length today  #HTN: Elevated today, however, patient did not take her clonidine this AM. States it's otherwise better controlled.  -Continue clonidine 0.1mg  qHS -Lasix 80mg  BID as above -Can increase spiro to 25mg  daily pending renal function  #HLD: Calcium score 0. Merits statin due to DMII. -Continue prava 40mg  daily  #OSA on CPAP: -Continue CPAP  #Unprovoked DVT: On lifelong warfarin. -Continue warfarin         Medication Adjustments/Labs and Tests Ordered: Current medicines are reviewed at length with the patient today.  Concerns regarding medicines are outlined above.  Orders Placed This Encounter  Procedures   Basic metabolic panel   Pro b natriuretic peptide   Magnesium   No orders of the defined types were placed in this encounter.   Patient Instructions  Medication Instructions:   Your physician recommends that you continue on your current medications as directed. Please refer to the Current Medication list given to you today.  *If you need a refill on your cardiac medications before your next appointment, please call your pharmacy*   Lab Work:  TODAY--BMET, PRO-BNP, AND MAGNESIUM LEVEL  If you have labs (blood work) drawn today and your tests are completely normal, you will receive your results only by: [place] (if you have MyChart) OR A paper copy in the mail If you have any lab test that is abnormal or we need to change your treatment, we will call you to review the results.   Follow-Up:  IN AROUND 2 WEEKS WITH DR. CHANDRASEKHAR--SCHEDULING PLEASE ADD TO HIS SCHEDULE FOR 02/21/21 AT 2:20 PM OKAY PER DR. Johney Frame  }    Other Instructions DASH  Eating Plan DASH stands for Dietary Approaches to Stop Hypertension. The DASH eating plan is a healthy eating plan that has been shown to: Reduce high blood pressure (hypertension). Reduce your risk for type 2 diabetes, heart disease, and stroke. Help with weight loss. What are tips for following this plan? Reading food labels Check food labels for the amount of salt (sodium) per serving. Choose foods with less than 5 percent of the Daily Value of sodium. Generally, foods with less than 300 milligrams (mg) of sodium per serving fit into this eating plan. To find whole grains, look for the word "whole" as the first word in the ingredient list. Shopping Buy products labeled as "low-sodium" or "no salt added." Buy fresh foods. Avoid canned foods and pre-made or frozen meals. Cooking Avoid adding salt when cooking. Use salt-free seasonings or herbs instead of table salt or sea salt. Check with your health care provider or pharmacist before using salt substitutes. Do not fry foods. Cook foods using healthy methods such as baking, boiling, grilling, roasting, and broiling instead.  Cook with heart-healthy oils, such as olive, canola, avocado, soybean, or sunflower oil. Meal planning Eat a balanced diet that includes: 4 or more servings of fruits and 4 or more servings of vegetables each day. Try to fill one-half of your plate with fruits and vegetables. 6-8 servings of whole grains each day. Less than 6 oz (170 g) of lean meat, poultry, or fish each day. A 3-oz (85-g) serving of meat is about the same size as a deck of cards. One egg equals 1 oz (28 g). 2-3 servings of low-fat dairy each day. One serving is 1 cup (237 mL). 1 serving of nuts, seeds, or beans 5 times each week. 2-3 servings of heart-healthy fats. Healthy fats called omega-3 fatty acids are found in foods such as walnuts, flaxseeds, fortified milks, and eggs. These fats are also found in cold-water fish, such as sardines, salmon, and  mackerel. Limit how much you eat of: Canned or prepackaged foods. Food that is high in trans fat, such as some fried foods. Food that is high in saturated fat, such as fatty meat. Desserts and other sweets, sugary drinks, and other foods with added sugar. Full-fat dairy products. Do not salt foods before eating. Do not eat more than 4 egg yolks a week. Try to eat at least 2 vegetarian meals a week. Eat more home-cooked food and less restaurant, buffet, and fast food. Lifestyle When eating at a restaurant, ask that your food be prepared with less salt or no salt, if possible. If you drink alcohol: Limit how much you use to: 0-1 drink a day for women who are not pregnant. 0-2 drinks a day for men. Be aware of how much alcohol is in your drink. In the [place] one drink equals one 12 oz bottle of beer (355 mL), one 5 oz glass of wine (148 mL), or one 1 oz glass of hard liquor (44 mL). General information Avoid eating more than 2,300 mg of salt a day. If you have hypertension, you may need to reduce your sodium intake to 1,500 mg a day. Work with your health care provider to maintain a healthy body weight or to lose weight. Ask what an ideal weight is for you. Get at least 30 minutes of exercise that causes your heart to beat faster (aerobic exercise) most days of the week. Activities may include walking, swimming, or biking. Work with your health care provider or dietitian to adjust your eating plan to your individual calorie needs. What foods should I eat? Fruits All fresh, dried, or frozen fruit. Canned fruit in natural juice (without added sugar). Vegetables Fresh or frozen vegetables (raw, steamed, roasted, or grilled). Low-sodium or reduced-sodium tomato and vegetable juice. Low-sodium or reduced-sodium tomato sauce and tomato paste. Low-sodium or reduced-sodium canned vegetables. Grains Whole-grain or whole-wheat bread. Whole-grain or whole-wheat pasta. Brown rice. [place].  Bulgur. Whole-grain and low-sodium cereals. Pita bread. Low-fat, low-sodium crackers. Whole-wheat flour tortillas. Meats and other proteins Skinless chicken or [place]. Ground chicken or [place]. Pork with fat trimmed off. Fish and seafood. Egg whites. Dried beans, peas, or lentils. Unsalted nuts, nut butters, and seeds. Unsalted canned beans. Lean cuts of beef with fat trimmed off. Low-sodium, lean precooked or cured meat, such as sausages or meat loaves. Dairy Low-fat (1%) or fat-free (skim) milk. Reduced-fat, low-fat, or fat-free cheeses. Nonfat, low-sodium ricotta or cottage cheese. Low-fat or nonfat yogurt. Low-fat, low-sodium cheese. Fats and oils Soft margarine without trans fats. Vegetable oil. Reduced-fat, low-fat, or light mayonnaise and  salad dressings (reduced-sodium). Canola, safflower, olive, avocado, soybean, and sunflower oils. Avocado. Seasonings and condiments Herbs. Spices. Seasoning mixes without salt. Other foods Unsalted popcorn and pretzels. Fat-free sweets. The items listed above may not be a complete list of foods and beverages you can eat. Contact a dietitian for more information. What foods should I avoid? Fruits Canned fruit in a light or heavy syrup. Fried fruit. Fruit in cream or butter sauce. Vegetables Creamed or fried vegetables. Vegetables in a cheese sauce. Regular canned vegetables (not low-sodium or reduced-sodium). Regular canned tomato sauce and paste (not low-sodium or reduced-sodium). Regular tomato and vegetable juice (not low-sodium or reduced-sodium). Angie Fava. Olives. Grains Baked goods made with fat, such as croissants, muffins, or some breads. Dry pasta or rice meal packs. Meats and other proteins Fatty cuts of meat. Ribs. Fried meat. Berniece Salines. Bologna, salami, and other precooked or cured meats, such as sausages or meat loaves. Fat from the back of a pig (fatback). Bratwurst. Salted nuts and seeds. Canned beans with added salt. Canned or smoked fish.  Whole eggs or egg yolks. Chicken or [place] with skin. Dairy Whole or 2% milk, cream, and half-and-half. Whole or full-fat cream cheese. Whole-fat or sweetened yogurt. Full-fat cheese. Nondairy creamers. Whipped toppings. Processed cheese and cheese spreads. Fats and oils Butter. Stick margarine. Lard. Shortening. Ghee. Bacon fat. Tropical oils, such as coconut, palm kernel, or palm oil. Seasonings and condiments Onion salt, garlic salt, seasoned salt, table salt, and sea salt. Worcestershire sauce. Tartar sauce. Barbecue sauce. Teriyaki sauce. Soy sauce, including reduced-sodium. Steak sauce. Canned and packaged gravies. Fish sauce. Oyster sauce. Cocktail sauce. Store-bought horseradish. Ketchup. Mustard. Meat flavorings and tenderizers. Bouillon cubes. Hot sauces. Pre-made or packaged marinades. Pre-made or packaged taco seasonings. Relishes. Regular salad dressings. Other foods Salted popcorn and pretzels. The items listed above may not be a complete list of foods and beverages you should avoid. Contact a dietitian for more information. Where to find more information National Heart, Lung, and Blood Institute: https://wilson-eaton.com/ American Heart Association: www.heart.org Academy of Nutrition and Dietetics: www.eatright.[place]: www.kidney.org Summary The DASH eating plan is a healthy eating plan that has been shown to reduce high blood pressure (hypertension). It may also reduce your risk for type 2 diabetes, heart disease, and stroke. When on the DASH eating plan, aim to eat more fresh fruits and vegetables, whole grains, lean proteins, low-fat dairy, and heart-healthy fats. With the DASH eating plan, you should limit salt (sodium) intake to 2,300 mg a day. If you have hypertension, you may need to reduce your sodium intake to 1,500 mg a day. Work with your health care provider or dietitian to adjust your eating plan to your individual calorie needs. This information is not  intended to replace advice given to you by your health care provider. Make sure you discuss any questions you have with your health care provider. Document Revised: 02/07/2019 Document Reviewed: 02/07/2019 Elsevier Patient Education  2022 [place].   Low-Sodium Eating Plan Sodium, which is an element that makes up salt, helps you maintain a healthy balance of fluids in your body. Too much sodium can increase your blood pressure and cause fluid and waste to be held in your body. Your health care provider or dietitian may recommend following this plan if you have high blood pressure (hypertension), kidney disease, liver disease, or heart failure. Eating less sodium can help lower your blood pressure, reduce swelling, and protect your heart, liver, and kidneys. What are tips for following  this plan? Reading food labels The Nutrition Facts label lists the amount of sodium in one serving of the food. If you eat more than one serving, you must multiply the listed amount of sodium by the number of servings. Choose foods with less than 140 mg of sodium per serving. Avoid foods with 300 mg of sodium or more per serving. Shopping Look for lower-sodium products, often labeled as "low-sodium" or "no salt added." Always check the sodium content, even if foods are labeled as "unsalted" or "no salt added." Buy fresh foods. Avoid canned foods and pre-made or frozen meals. Avoid canned, cured, or processed meats. Buy breads that have less than 80 mg of sodium per slice. Cooking Eat more home-cooked food and less restaurant, buffet, and fast food. Avoid adding salt when cooking. Use salt-free seasonings or herbs instead of table salt or sea salt. Check with your health care provider or pharmacist before using salt substitutes. Cook with plant-based oils, such as canola, sunflower, or olive oil. Meal planning When eating at a restaurant, ask that your food be prepared with less salt or no salt, if  possible. Avoid dishes labeled as brined, pickled, cured, smoked, or made with soy sauce, miso, or teriyaki sauce. Avoid foods that contain MSG (monosodium glutamate). MSG is sometimes added to [place] food, bouillon, and some canned foods. Make meals that can be grilled, baked, poached, roasted, or steamed. These are generally made with less sodium. General information Most people on this plan should limit their sodium intake to 1,500-2,000 mg (milligrams) of sodium each day. What foods should I eat? Fruits Fresh, frozen, or canned fruit. Fruit juice. Vegetables Fresh or frozen vegetables. "No salt added" canned vegetables. "No salt added" tomato sauce and paste. Low-sodium or reduced-sodium tomato and vegetable juice. Grains Low-sodium cereals, including oats, puffed wheat and rice, and shredded wheat. Low-sodium crackers. Unsalted rice. Unsalted pasta. Low-sodium bread. Whole-grain breads and whole-grain pasta. Meats and other proteins Fresh or frozen (no salt added) meat, poultry, seafood, and fish. Low-sodium canned tuna and salmon. Unsalted nuts. Dried peas, beans, and lentils without added salt. Unsalted canned beans. Eggs. Unsalted nut butters. Dairy Milk. Soy milk. Cheese that is naturally low in sodium, such as ricotta cheese, fresh mozzarella, or Swiss cheese. Low-sodium or reduced-sodium cheese. Cream cheese. Yogurt. Seasonings and condiments Fresh and dried herbs and spices. Salt-free seasonings. Low-sodium mustard and ketchup. Sodium-free salad dressing. Sodium-free light mayonnaise. Fresh or refrigerated horseradish. Lemon juice. Vinegar. Other foods Homemade, reduced-sodium, or low-sodium soups. Unsalted popcorn and pretzels. Low-salt or salt-free chips. The items listed above may not be a complete list of foods and beverages you can eat. Contact a dietitian for more information. What foods should I avoid? Vegetables Sauerkraut, pickled vegetables, and relishes. Olives. [place]  fries. Onion rings. Regular canned vegetables (not low-sodium or reduced-sodium). Regular canned tomato sauce and paste (not low-sodium or reduced-sodium). Regular tomato and vegetable juice (not low-sodium or reduced-sodium). Frozen vegetables in sauces. Grains Instant hot cereals. Bread stuffing, pancake, and biscuit mixes. Croutons. Seasoned rice or pasta mixes. Noodle soup cups. Boxed or frozen macaroni and cheese. Regular salted crackers. Self-rising flour. Meats and other proteins Meat or fish that is salted, canned, smoked, spiced, or pickled. Precooked or cured meat, such as sausages or meat loaves. [place] Salines. Ham. Pepperoni. Hot dogs. Corned beef. Chipped beef. Salt pork. Jerky. Pickled herring. Anchovies and sardines. Regular canned tuna. Salted nuts. Dairy Processed cheese and cheese spreads. Hard cheeses. Cheese curds. Blue cheese. Feta cheese. String cheese.  Regular cottage cheese. Buttermilk. Canned milk. Fats and oils Salted butter. Regular margarine. Ghee. Bacon fat. Seasonings and condiments Onion salt, garlic salt, seasoned salt, table salt, and sea salt. Canned and packaged gravies. Worcestershire sauce. Tartar sauce. Barbecue sauce. Teriyaki sauce. Soy sauce, including reduced-sodium. Steak sauce. Fish sauce. Oyster sauce. Cocktail sauce. Horseradish that you find on the shelf. Regular ketchup and mustard. Meat flavorings and tenderizers. Bouillon cubes. Hot sauce. Pre-made or packaged marinades. Pre-made or packaged taco seasonings. Relishes. Regular salad dressings. Salsa. Other foods Salted popcorn and pretzels. Corn chips and puffs. Potato and tortilla chips. Canned or dried soups. Pizza. Frozen entrees and pot pies. The items listed above may not be a complete list of foods and beverages you should avoid. Contact a dietitian for more information. Summary Eating less sodium can help lower your blood pressure, reduce swelling, and protect your heart, liver, and kidneys. Most people  on this plan should limit their sodium intake to 1,500-2,000 mg (milligrams) of sodium each day. Canned, boxed, and frozen foods are high in sodium. Restaurant foods, fast foods, and pizza are also very high in sodium. You also get sodium by adding salt to food. Try to cook at home, eat more fresh fruits and vegetables, and eat less fast food and canned, processed, or prepared foods. This information is not intended to replace advice given to you by your health care provider. Make sure you discuss any questions you have with your health care provider. Document Revised: 04/11/2019 Document Reviewed: 02/05/2019 Elsevier Patient Education  2022 [place], Freada Bergeron, MD  02/07/2021 5:06 PM    [place]

## 2021-02-07 ENCOUNTER — Encounter (INDEPENDENT_AMBULATORY_CARE_PROVIDER_SITE_OTHER): Payer: Self-pay | Admitting: Ophthalmology

## 2021-02-07 ENCOUNTER — Encounter: Payer: Self-pay | Admitting: Cardiology

## 2021-02-07 ENCOUNTER — Ambulatory Visit (INDEPENDENT_AMBULATORY_CARE_PROVIDER_SITE_OTHER): Payer: Medicare Other | Admitting: Ophthalmology

## 2021-02-07 ENCOUNTER — Other Ambulatory Visit: Payer: Self-pay

## 2021-02-07 ENCOUNTER — Ambulatory Visit (INDEPENDENT_AMBULATORY_CARE_PROVIDER_SITE_OTHER): Payer: Medicare Other | Admitting: Cardiology

## 2021-02-07 VITALS — BP 156/96 | HR 90 | Ht 66.0 in | Wt 293.0 lb

## 2021-02-07 DIAGNOSIS — H43823 Vitreomacular adhesion, bilateral: Secondary | ICD-10-CM | POA: Diagnosis not present

## 2021-02-07 DIAGNOSIS — Z79899 Other long term (current) drug therapy: Secondary | ICD-10-CM

## 2021-02-07 DIAGNOSIS — E669 Obesity, unspecified: Secondary | ICD-10-CM

## 2021-02-07 DIAGNOSIS — R002 Palpitations: Secondary | ICD-10-CM | POA: Diagnosis not present

## 2021-02-07 DIAGNOSIS — G4733 Obstructive sleep apnea (adult) (pediatric): Secondary | ICD-10-CM | POA: Diagnosis not present

## 2021-02-07 DIAGNOSIS — I10 Essential (primary) hypertension: Secondary | ICD-10-CM

## 2021-02-07 DIAGNOSIS — I5033 Acute on chronic diastolic (congestive) heart failure: Secondary | ICD-10-CM

## 2021-02-07 DIAGNOSIS — E782 Mixed hyperlipidemia: Secondary | ICD-10-CM

## 2021-02-07 DIAGNOSIS — Z86718 Personal history of other venous thrombosis and embolism: Secondary | ICD-10-CM

## 2021-02-07 DIAGNOSIS — E1169 Type 2 diabetes mellitus with other specified complication: Secondary | ICD-10-CM | POA: Diagnosis not present

## 2021-02-07 NOTE — Progress Notes (Signed)
02/07/2021     CHIEF COMPLAINT Patient presents for  Chief Complaint  Patient presents with   Retina Follow Up      HISTORY OF PRESENT ILLNESS: [name] is a 65 y.o. female who presents to the clinic today for:   HPI     Retina Follow Up   Patient presents with  Other.  In both eyes.  This started 1 year ago.  Duration of 1 year.        Comments   1 year f/u OU with OCT and FP.  Pt denies any visual changes since previous visit. Pt reports having recent heart problems, such as diagnosed with an enlarged heart.        Last edited by Frederik Pear, COA on 02/07/2021  9:19 AM.      Referring physician: Ernesto Rutherford, MD [address] [place],  [place] [address]  HISTORICAL INFORMATION:   Selected notes from the MEDICAL RECORD NUMBER    Lab Results  Component Value Date   HGBA1C 8.0 (H) 02/02/2021     CURRENT MEDICATIONS: No current outpatient medications on file. (Ophthalmic Drugs)   No current facility-administered medications for this visit. (Ophthalmic Drugs)   Current Outpatient Medications (Other)  Medication Sig   ACCU-CHEK GUIDE test strip USE UP TO FOUR TIMES DAILY AS DIRECTED.   Accu-Chek Softclix Lancets lancets USE UP TO FOUR TIMES DAILY AS DIRECTED.   albuterol (PROVENTIL HFA;VENTOLIN HFA) 108 (90 BASE) MCG/ACT inhaler Inhale 2 puffs into the lungs every 4 (four) hours as needed for shortness of breath.   albuterol (PROVENTIL) (2.5 MG/3ML) 0.083% nebulizer solution USE 1 VIAL IN NEBULIZER EVERY 6 HOURS - and as needed   blood glucose meter kit and supplies KIT Dispense based on patient and insurance preference. Use up to four times daily as directed. (FOR ICD-9 250.00, 250.01).   brexpiprazole (REXULTI) 2 MG TABS tablet Take 1 tablet by mouth daily.   budesonide-formoterol (SYMBICORT) 160-4.5 MCG/ACT inhaler Inhale 2 puffs into the lungs 2 (two) times daily.   canagliflozin (INVOKANA) 100 MG TABS tablet Take 1 tablet (100 mg  total) by mouth daily before breakfast.   CARTIA XT 240 MG 24 hr capsule Take 1 capsule (240 mg total) by mouth daily.   cloNIDine (CATAPRES) 0.1 MG tablet TAKE ONE TABLET BY MOUTH BEFORE BREAKFAST and TAKE ONE TABLET BY MOUTH EVERYDAY AT BEDTIME   diclofenac sodium (VOLTAREN) 1 % GEL Apply 2 g topically daily as needed (fibromyalgia pain).   DULoxetine (CYMBALTA) 20 MG capsule Take 20 mg by mouth daily.   DULoxetine (CYMBALTA) 60 MG capsule Take 60 mg by mouth at bedtime.    fluticasone (FLONASE) 50 MCG/ACT nasal spray Place 2 sprays into both nostrils 2 (two) times daily.   furosemide (LASIX) 80 MG tablet Take 1 tablet (80 mg total) by mouth 2 (two) times daily.   gabapentin (NEURONTIN) 300 MG capsule Take 1 capsule (300 mg total) by mouth 2 (two) times daily.   hydrochlorothiazide (HYDRODIURIL) 12.5 MG tablet  (Patient not taking: Reported on 02/03/2021)   levocetirizine (XYZAL) 5 MG tablet Take 1 tablet (5 mg total) by mouth at bedtime.   LINZESS 145 MCG CAPS capsule Take 1 capsule (145 mcg total) by mouth every other day.   meclizine (ANTIVERT) 25 MG tablet Take 1 tablet (25 mg total) by mouth 3 (three) times daily as needed for dizziness.   metFORMIN (GLUCOPHAGE) 500 MG tablet Take 1 tablet (500  mg total) by mouth at bedtime.   metoCLOPramide (REGLAN) 10 MG tablet Take 1 tablet (10 mg total) by mouth every 6 (six) hours as needed for nausea (or headache).   omeprazole (PRILOSEC) 20 MG capsule Take 1 capsule (20 mg total) by mouth 2 (two) times daily.   potassium chloride (MICRO-K) 10 MEQ CR capsule TAKE TWO CAPSULES BY MOUTH EVERYDAY AT BEDTIME   pravastatin (PRAVACHOL) 40 MG tablet TAKE 1 TABLET(40 MG) BY MOUTH EVERY EVENING   spironolactone (ALDACTONE) 25 MG tablet Take 0.5 tablets (12.5 mg total) by mouth daily.   umeclidinium-vilanterol (ANORO ELLIPTA) 62.5-25 MCG/INH AEPB Inhale into the lungs.   Vitamin D, Ergocalciferol, (DRISDOL) 1.25 MG (50000 UNIT) CAPS capsule Take 1 capsule  (50,000 Units total) by mouth every 7 (seven) days.   warfarin (COUMADIN) 10 MG tablet TAKE 1 TABLET BY MOUTH ONCE A DAY ON MONDAYS, WEDNESDAYS, AND FRIDAYS   warfarin (COUMADIN) 5 MG tablet TAKE ONE TABLET BY MOUTH daily EXCEPT MONDAY, WEDNESDAY, AND Friday. OR as directed (Patient taking differently: Take 5 mg by mouth. TAKE ONE TABLET BY MOUTH daily on TUESDAY, Thursday, SATURDAY AND SUNDAY.)   zonisamide (ZONEGRAN) 50 MG capsule 1 TABLET AM AND 2 TABLETS PM   No current facility-administered medications for this visit. (Other)      REVIEW OF SYSTEMS:    ALLERGIES Allergies  Allergen Reactions   Lisinopril Swelling    THROAT SWELLING Pt states her throat started to "close up" after being on it for "so long."   Corticosteroids Rash    States she developed a rash on her tongue after a steroid injection in her bac    PAST MEDICAL HISTORY Past Medical History:  Diagnosis Date   Adrenal mass ([place]) 03/226   Benign   Arthritis    knees/multiple orthopedic conditons; lower back   Asthma    per pt   Clotting disorder (HCC)    +beta-2-glycoprotein IgA antibody   COPD (chronic obstructive pulmonary disease) (HCC)    inhalers dependent on environment   Depression    Diabetes mellitus    120s usually fasting -  dx more than 10 yrs ago   Dizziness, nonspecific    DVT (deep venous thrombosis) (HCC)    Recurrent   Fibromyalgia    GERD (gastroesophageal reflux disease)    Heart murmur    History of blood transfusion    History of cocaine abuse ([place])    Remote history    Hyperlipidemia    Hypertension    takes meds daily   Hypothyroidism    Lupus Anticoagulant Positive    On home oxygen therapy    at night   Shortness of breath    exertion or lying flat   Sleep apnea    2l of oxygen at night (as of 12/6, she used to)   Past Surgical History:  Procedure Laterality Date   ABDOMINAL HYSTERECTOMY  1989   Fibroids   ANTERIOR LUMBAR FUSION  01/03/2012   Procedure:  ANTERIOR LUMBAR FUSION 1 LEVEL;  Surgeon: Winfield Cunas, MD;  Location: [facility];  Service: Neurosurgery;  Laterality: N/A;  Lumbar Four-Five Anterior Lumbar Interbody Fusion with Instrumentation   BACK SURGERY     cervical spine---disk disease   CARPAL TUNNEL RELEASE     Bilateral   CESAREAN SECTION     CHOLECYSTECTOMY     CYSTOSCOPY/RETROGRADE/URETEROSCOPY/STONE EXTRACTION WITH BASKET Right 04/26/2014   Procedure: CYSTOSCOPY/ RIGHT RETROGRADE/ RIGHT URETEROSCOPY/URETERAL AND RENAL PELVIS BIOPSY;  Surgeon:  Alexis Frock, MD;  Location: [facility];  Service: Urology;  Laterality: Right;   gallstones removed     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   bilateral   right elbow surgery     THYROID LOBECTOMY Right 02/27/2017   THYROID LOBECTOMY Right 02/27/2017   Procedure: RIGHT THYROID LOBECTOMY;  Surgeon: Erroll Luna, MD;  Location: [facility];  Service: General;  Laterality: Right;   TUBAL LIGATION      FAMILY HISTORY Family History  Problem Relation Age of Onset   Hypertension Mother    Diabetes Mother    Cancer Mother        Pancreatic   Hypertension Father    Heart failure Father    Hyperlipidemia Other    COPD Other     SOCIAL HISTORY Social History   Tobacco Use   Smoking status: Never   Smokeless tobacco: Never  Vaping Use   Vaping Use: Never used  Substance Use Topics   Alcohol use: No    Alcohol/week: 0.0 standard drinks    Comment: beer in past    Drug use: Yes    Types: Cocaine    Comment: quit 2003-rehab progam in 2005         OPHTHALMIC EXAM:  Base Eye Exam     Visual Acuity (ETDRS)       Right Left   Dist cc 20/20 20/20    Correction: Glasses         Tonometry (Tonopen, 9:25 AM)       Right Left   Pressure 14 16         Pupils       Pupils Dark Light Shape React APD   Right PERRL 4 3 Round Brisk None   Left PERRL 4 3 Round Brisk None         Visual Fields       Left Right    Full Full         Extraocular Movement        Right Left    Full, Ortho Full, Ortho         Neuro/Psych     Oriented x3: Yes         Dilation     Both eyes: 1.0% Mydriacyl, 2.5% Phenylephrine @ 9:25 AM           Slit Lamp and Fundus Exam     External Exam       Right Left   External Normal Normal         Slit Lamp Exam       Right Left   Lids/Lashes Normal Normal   Conjunctiva/Sclera White and quiet White and quiet   Cornea Clear Clear   Anterior Chamber Deep and quiet Deep and quiet   Iris Round and reactive Round and reactive   Lens Centered posterior chamber intraocular lens, Open posterior capsule Centered posterior chamber intraocular lens, , Open posterior capsule, Posterior capsular opacification   Anterior Vitreous Normal Normal         Fundus Exam       Right Left   Posterior Vitreous Normal Normal   Disc Normal Normal   C/D Ratio 0.55 0.55   Macula Normal Normal   Vessels no DR no DR   Periphery Normal Normal            IMAGING AND PROCEDURES  Imaging and Procedures for 02/07/21  OCT, Retina - OU - Both Eyes  Right Eye Quality was good. Scan locations included subfoveal. Central Foveal Thickness: 255. Progression has improved. Findings include vitreomacular adhesion .   Left Eye Quality was good. Scan locations included subfoveal. Central Foveal Thickness: 262. Progression has improved. Findings include normal observations, normal foveal contour, vitreomacular adhesion .   Notes No active maculopathy OU, jwith physiologic VMA              ASSESSMENT/PLAN:  OSA (obstructive sleep apnea) Patient reports to me that she is unable to compliant with use of the OSA, CPAP therapy due to not being able to "breathe.  Of urged her to follow-up with her "OSA" or CPAP Dr. 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 The patient advised to maintain good blood glucose control, excellent blood pressure control, and favorable levels of cholesterol, low density lipoprotein, and high density lipoproteins. Follow up in 1 year was recommended. Explained that fluctuations in visual acuity , or "out of focus", may result from large variations of blood sugar control.      ICD-10-CM   1. Vitreomacular adhesion of both eyes  H43.823 OCT, Retina - OU - Both Eyes    Color Fundus Photography Optos - OU - Both Eyes    2. OSA (obstructive sleep apnea)  G47.33     3. Diabetes mellitus type 2 in obese (HCC)  E11.69    E66.9       1.  2.  3.  Ophthalmic Meds Ordered this visit:  No orders of the defined types were placed in this encounter.      No follow-ups on file.  There are no Patient Instructions on file for this visit.   Explained the diagnoses, plan, and follow up with the patient and they expressed understanding.  Patient expressed understanding of the importance of proper follow up care.   Clent Demark Deetya Drouillard M.D. Diseases & Surgery of the Retina and Vitreous [facility] [place] 02/07/21     Abbreviations: M myopia (nearsighted); A astigmatism; H hyperopia (farsighted); P presbyopia; Mrx spectacle prescription;  CTL contact lenses; OD right eye; OS left eye; OU both eyes  XT exotropia; ET esotropia; PEK punctate epithelial keratitis; PEE punctate epithelial erosions; DES dry eye syndrome; MGD meibomian gland dysfunction; ATs artificial tears; PFAT's preservative free artificial tears; [place] nuclear sclerotic cataract; PSC posterior subcapsular cataract; ERM epi-retinal membrane; PVD posterior vitreous detachment; RD retinal detachment; DM diabetes mellitus; DR diabetic retinopathy; NPDR non-proliferative diabetic retinopathy; PDR  proliferative diabetic retinopathy; CSME clinically significant macular edema; DME diabetic macular edema; dbh dot blot hemorrhages; CWS cotton wool spot; POAG primary open angle glaucoma; C/D cup-to-disc ratio; HVF humphrey visual field; GVF goldmann visual field; OCT optical coherence tomography; IOP intraocular pressure; BRVO Branch retinal vein occlusion; CRVO central retinal vein occlusion; CRAO central retinal artery occlusion; BRAO branch retinal artery occlusion; RT retinal tear; SB scleral buckle; PPV pars plana vitrectomy; VH Vitreous hemorrhage; PRP panretinal laser photocoagulation; IVK intravitreal kenalog; VMT vitreomacular traction; MH Macular hole;  NVD neovascularization of the disc; NVE neovascularization elsewhere; AREDS age related eye disease study; ARMD age related macular degeneration; POAG primary open angle glaucoma; EBMD epithelial/anterior basement membrane dystrophy; ACIOL  anterior chamber intraocular lens; IOL intraocular lens; PCIOL posterior chamber intraocular lens; Phaco/IOL phacoemulsification with intraocular lens placement; [place] photorefractive keratectomy; LASIK laser assisted in situ keratomileusis; HTN hypertension; DM diabetes mellitus; COPD chronic obstructive pulmonary disease

## 2021-02-07 NOTE — Assessment & Plan Note (Signed)
The patient has diabetes without any evidence of retinopathy. The patient advised to maintain good blood glucose control, excellent blood pressure control, and favorable levels of cholesterol, low density lipoprotein, and high density lipoproteins. Follow up in 1 year was recommended. Explained that fluctuations in visual acuity , or "out of focus", may result from large variations of blood sugar control. 

## 2021-02-07 NOTE — Assessment & Plan Note (Signed)
Physiologic OU 

## 2021-02-07 NOTE — Assessment & Plan Note (Signed)
Patient reports to me that she is unable to compliant with use of the OSA, CPAP therapy due to not being able to "breathe.  Of urged her to follow-up with her "OSA" or CPAP Dr. To work to manage whether there is upper airway congestion and/or poor mask fit but to do everything she can so as to prevent nightly hypoxic damage to her heart, brain and to her eyes, from untreated OSA

## 2021-02-07 NOTE — Patient Instructions (Addendum)
Medication Instructions:   Your physician recommends that you continue on your current medications as directed. Please refer to the Current Medication list given to you today.  *If you need a refill on your cardiac medications before your next appointment, please call your pharmacy*   Lab Work:  TODAY--BMET, PRO-BNP, AND MAGNESIUM LEVEL  If you have labs (blood work) drawn today and your tests are completely normal, you will receive your results only by: [place] (if you have MyChart) OR A paper copy in the mail If you have any lab test that is abnormal or we need to change your treatment, we will call you to review the results.   Follow-Up:  IN AROUND 2 WEEKS WITH DR. CHANDRASEKHAR--SCHEDULING PLEASE ADD TO HIS SCHEDULE FOR 02/21/21 AT 2:20 PM OKAY PER DR. Johney Frame  }    Other Instructions DASH Eating Plan DASH stands for Dietary Approaches to Stop Hypertension. The DASH eating plan is a healthy eating plan that has been shown to: Reduce high blood pressure (hypertension). Reduce your risk for type 2 diabetes, heart disease, and stroke. Help with weight loss. What are tips for following this plan? Reading food labels Check food labels for the amount of salt (sodium) per serving. Choose foods with less than 5 percent of the Daily Value of sodium. Generally, foods with less than 300 milligrams (mg) of sodium per serving fit into this eating plan. To find whole grains, look for the word "whole" as the first word in the ingredient list. Shopping Buy products labeled as "low-sodium" or "no salt added." Buy fresh foods. Avoid canned foods and pre-made or frozen meals. Cooking Avoid adding salt when cooking. Use salt-free seasonings or herbs instead of table salt or sea salt. Check with your health care provider or pharmacist before using salt substitutes. Do not fry foods. Cook foods using healthy methods such as baking, boiling, grilling, roasting, and broiling  instead. Cook with heart-healthy oils, such as olive, canola, avocado, soybean, or sunflower oil. Meal planning Eat a balanced diet that includes: 4 or more servings of fruits and 4 or more servings of vegetables each day. Try to fill one-half of your plate with fruits and vegetables. 6-8 servings of whole grains each day. Less than 6 oz (170 g) of lean meat, poultry, or fish each day. A 3-oz (85-g) serving of meat is about the same size as a deck of cards. One egg equals 1 oz (28 g). 2-3 servings of low-fat dairy each day. One serving is 1 cup (237 mL). 1 serving of nuts, seeds, or beans 5 times each week. 2-3 servings of heart-healthy fats. Healthy fats called omega-3 fatty acids are found in foods such as walnuts, flaxseeds, fortified milks, and eggs. These fats are also found in cold-water fish, such as sardines, salmon, and mackerel. Limit how much you eat of: Canned or prepackaged foods. Food that is high in trans fat, such as some fried foods. Food that is high in saturated fat, such as fatty meat. Desserts and other sweets, sugary drinks, and other foods with added sugar. Full-fat dairy products. Do not salt foods before eating. Do not eat more than 4 egg yolks a week. Try to eat at least 2 vegetarian meals a week. Eat more home-cooked food and less restaurant, buffet, and fast food. Lifestyle When eating at a restaurant, ask that your food be prepared with less salt or no salt, if possible. If you drink alcohol: Limit how much you use to: 0-1 drink a  day for women who are not pregnant. 0-2 drinks a day for men. Be aware of how much alcohol is in your drink. In the [place] one drink equals one 12 oz bottle of beer (355 mL), one 5 oz glass of wine (148 mL), or one 1 oz glass of hard liquor (44 mL). General information Avoid eating more than 2,300 mg of salt a day. If you have hypertension, you may need to reduce your sodium intake to 1,500 mg a day. Work with your health care  provider to maintain a healthy body weight or to lose weight. Ask what an ideal weight is for you. Get at least 30 minutes of exercise that causes your heart to beat faster (aerobic exercise) most days of the week. Activities may include walking, swimming, or biking. Work with your health care provider or dietitian to adjust your eating plan to your individual calorie needs. What foods should I eat? Fruits All fresh, dried, or frozen fruit. Canned fruit in natural juice (without added sugar). Vegetables Fresh or frozen vegetables (raw, steamed, roasted, or grilled). Low-sodium or reduced-sodium tomato and vegetable juice. Low-sodium or reduced-sodium tomato sauce and tomato paste. Low-sodium or reduced-sodium canned vegetables. Grains Whole-grain or whole-wheat bread. Whole-grain or whole-wheat pasta. Brown rice. [place]. Bulgur. Whole-grain and low-sodium cereals. Pita bread. Low-fat, low-sodium crackers. Whole-wheat flour tortillas. Meats and other proteins Skinless chicken or [place]. Ground chicken or [place]. Pork with fat trimmed off. Fish and seafood. Egg whites. Dried beans, peas, or lentils. Unsalted nuts, nut butters, and seeds. Unsalted canned beans. Lean cuts of beef with fat trimmed off. Low-sodium, lean precooked or cured meat, such as sausages or meat loaves. Dairy Low-fat (1%) or fat-free (skim) milk. Reduced-fat, low-fat, or fat-free cheeses. Nonfat, low-sodium ricotta or cottage cheese. Low-fat or nonfat yogurt. Low-fat, low-sodium cheese. Fats and oils Soft margarine without trans fats. Vegetable oil. Reduced-fat, low-fat, or light mayonnaise and salad dressings (reduced-sodium). Canola, safflower, olive, avocado, soybean, and sunflower oils. Avocado. Seasonings and condiments Herbs. Spices. Seasoning mixes without salt. Other foods Unsalted popcorn and pretzels. Fat-free sweets. The items listed above may not be a complete list of foods and beverages you can eat. Contact  a dietitian for more information. What foods should I avoid? Fruits Canned fruit in a light or heavy syrup. Fried fruit. Fruit in cream or butter sauce. Vegetables Creamed or fried vegetables. Vegetables in a cheese sauce. Regular canned vegetables (not low-sodium or reduced-sodium). Regular canned tomato sauce and paste (not low-sodium or reduced-sodium). Regular tomato and vegetable juice (not low-sodium or reduced-sodium). Angie Fava. Olives. Grains Baked goods made with fat, such as croissants, muffins, or some breads. Dry pasta or rice meal packs. Meats and other proteins Fatty cuts of meat. Ribs. Fried meat. Berniece Salines. Bologna, salami, and other precooked or cured meats, such as sausages or meat loaves. Fat from the back of a pig (fatback). Bratwurst. Salted nuts and seeds. Canned beans with added salt. Canned or smoked fish. Whole eggs or egg yolks. Chicken or [place] with skin. Dairy Whole or 2% milk, cream, and half-and-half. Whole or full-fat cream cheese. Whole-fat or sweetened yogurt. Full-fat cheese. Nondairy creamers. Whipped toppings. Processed cheese and cheese spreads. Fats and oils Butter. Stick margarine. Lard. Shortening. Ghee. Bacon fat. Tropical oils, such as coconut, palm kernel, or palm oil. Seasonings and condiments Onion salt, garlic salt, seasoned salt, table salt, and sea salt. Worcestershire sauce. Tartar sauce. Barbecue sauce. Teriyaki sauce. Soy sauce, including reduced-sodium. Steak sauce. Canned and packaged gravies. Fish  sauce. Oyster sauce. Cocktail sauce. Store-bought horseradish. Ketchup. Mustard. Meat flavorings and tenderizers. Bouillon cubes. Hot sauces. Pre-made or packaged marinades. Pre-made or packaged taco seasonings. Relishes. Regular salad dressings. Other foods Salted popcorn and pretzels. The items listed above may not be a complete list of foods and beverages you should avoid. Contact a dietitian for more information. Where to find more  information National Heart, Lung, and Blood Institute: https://wilson-eaton.com/ American Heart Association: www.heart.org Academy of Nutrition and Dietetics: www.eatright.[place]: www.kidney.org Summary The DASH eating plan is a healthy eating plan that has been shown to reduce high blood pressure (hypertension). It may also reduce your risk for type 2 diabetes, heart disease, and stroke. When on the DASH eating plan, aim to eat more fresh fruits and vegetables, whole grains, lean proteins, low-fat dairy, and heart-healthy fats. With the DASH eating plan, you should limit salt (sodium) intake to 2,300 mg a day. If you have hypertension, you may need to reduce your sodium intake to 1,500 mg a day. Work with your health care provider or dietitian to adjust your eating plan to your individual calorie needs. This information is not intended to replace advice given to you by your health care provider. Make sure you discuss any questions you have with your health care provider. Document Revised: 02/07/2019 Document Reviewed: 02/07/2019 Elsevier Patient Education  2022 [place].   Low-Sodium Eating Plan Sodium, which is an element that makes up salt, helps you maintain a healthy balance of fluids in your body. Too much sodium can increase your blood pressure and cause fluid and waste to be held in your body. Your health care provider or dietitian may recommend following this plan if you have high blood pressure (hypertension), kidney disease, liver disease, or heart failure. Eating less sodium can help lower your blood pressure, reduce swelling, and protect your heart, liver, and kidneys. What are tips for following this plan? Reading food labels The Nutrition Facts label lists the amount of sodium in one serving of the food. If you eat more than one serving, you must multiply the listed amount of sodium by the number of servings. Choose foods with less than 140 mg of sodium per  serving. Avoid foods with 300 mg of sodium or more per serving. Shopping Look for lower-sodium products, often labeled as "low-sodium" or "no salt added." Always check the sodium content, even if foods are labeled as "unsalted" or "no salt added." Buy fresh foods. Avoid canned foods and pre-made or frozen meals. Avoid canned, cured, or processed meats. Buy breads that have less than 80 mg of sodium per slice. Cooking Eat more home-cooked food and less restaurant, buffet, and fast food. Avoid adding salt when cooking. Use salt-free seasonings or herbs instead of table salt or sea salt. Check with your health care provider or pharmacist before using salt substitutes. Cook with plant-based oils, such as canola, sunflower, or olive oil. Meal planning When eating at a restaurant, ask that your food be prepared with less salt or no salt, if possible. Avoid dishes labeled as brined, pickled, cured, smoked, or made with soy sauce, miso, or teriyaki sauce. Avoid foods that contain MSG (monosodium glutamate). MSG is sometimes added to [place] food, bouillon, and some canned foods. Make meals that can be grilled, baked, poached, roasted, or steamed. These are generally made with less sodium. General information Most people on this plan should limit their sodium intake to 1,500-2,000 mg (milligrams) of sodium each day. What foods should I  eat? Fruits Fresh, frozen, or canned fruit. Fruit juice. Vegetables Fresh or frozen vegetables. "No salt added" canned vegetables. "No salt added" tomato sauce and paste. Low-sodium or reduced-sodium tomato and vegetable juice. Grains Low-sodium cereals, including oats, puffed wheat and rice, and shredded wheat. Low-sodium crackers. Unsalted rice. Unsalted pasta. Low-sodium bread. Whole-grain breads and whole-grain pasta. Meats and other proteins Fresh or frozen (no salt added) meat, poultry, seafood, and fish. Low-sodium canned tuna and salmon. Unsalted nuts. Dried  peas, beans, and lentils without added salt. Unsalted canned beans. Eggs. Unsalted nut butters. Dairy Milk. Soy milk. Cheese that is naturally low in sodium, such as ricotta cheese, fresh mozzarella, or Swiss cheese. Low-sodium or reduced-sodium cheese. Cream cheese. Yogurt. Seasonings and condiments Fresh and dried herbs and spices. Salt-free seasonings. Low-sodium mustard and ketchup. Sodium-free salad dressing. Sodium-free light mayonnaise. Fresh or refrigerated horseradish. Lemon juice. Vinegar. Other foods Homemade, reduced-sodium, or low-sodium soups. Unsalted popcorn and pretzels. Low-salt or salt-free chips. The items listed above may not be a complete list of foods and beverages you can eat. Contact a dietitian for more information. What foods should I avoid? Vegetables Sauerkraut, pickled vegetables, and relishes. Olives. [place] fries. Onion rings. Regular canned vegetables (not low-sodium or reduced-sodium). Regular canned tomato sauce and paste (not low-sodium or reduced-sodium). Regular tomato and vegetable juice (not low-sodium or reduced-sodium). Frozen vegetables in sauces. Grains Instant hot cereals. Bread stuffing, pancake, and biscuit mixes. Croutons. Seasoned rice or pasta mixes. Noodle soup cups. Boxed or frozen macaroni and cheese. Regular salted crackers. Self-rising flour. Meats and other proteins Meat or fish that is salted, canned, smoked, spiced, or pickled. Precooked or cured meat, such as sausages or meat loaves. Berniece Salines. Ham. Pepperoni. Hot dogs. Corned beef. Chipped beef. Salt pork. Jerky. Pickled herring. Anchovies and sardines. Regular canned tuna. Salted nuts. Dairy Processed cheese and cheese spreads. Hard cheeses. Cheese curds. Blue cheese. Feta cheese. String cheese. Regular cottage cheese. Buttermilk. Canned milk. Fats and oils Salted butter. Regular margarine. Ghee. Bacon fat. Seasonings and condiments Onion salt, garlic salt, seasoned salt, table salt, and  sea salt. Canned and packaged gravies. Worcestershire sauce. Tartar sauce. Barbecue sauce. Teriyaki sauce. Soy sauce, including reduced-sodium. Steak sauce. Fish sauce. Oyster sauce. Cocktail sauce. Horseradish that you find on the shelf. Regular ketchup and mustard. Meat flavorings and tenderizers. Bouillon cubes. Hot sauce. Pre-made or packaged marinades. Pre-made or packaged taco seasonings. Relishes. Regular salad dressings. Salsa. Other foods Salted popcorn and pretzels. Corn chips and puffs. Potato and tortilla chips. Canned or dried soups. Pizza. Frozen entrees and pot pies. The items listed above may not be a complete list of foods and beverages you should avoid. Contact a dietitian for more information. Summary Eating less sodium can help lower your blood pressure, reduce swelling, and protect your heart, liver, and kidneys. Most people on this plan should limit their sodium intake to 1,500-2,000 mg (milligrams) of sodium each day. Canned, boxed, and frozen foods are high in sodium. Restaurant foods, fast foods, and pizza are also very high in sodium. You also get sodium by adding salt to food. Try to cook at home, eat more fresh fruits and vegetables, and eat less fast food and canned, processed, or prepared foods. This information is not intended to replace advice given to you by your health care provider. Make sure you discuss any questions you have with your health care provider. Document Revised: 04/11/2019 Document Reviewed: 02/05/2019 Elsevier Patient Education  2022 [employer].

## 2021-02-08 ENCOUNTER — Encounter (INDEPENDENT_AMBULATORY_CARE_PROVIDER_SITE_OTHER): Payer: Medicare Other | Admitting: Ophthalmology

## 2021-02-08 LAB — BASIC METABOLIC PANEL
BUN/Creatinine Ratio: 25 (ref 12–28)
BUN: 21 mg/dL (ref 8–27)
CO2: 27 mmol/L (ref 20–29)
Calcium: 9.7 mg/dL (ref 8.7–10.3)
Chloride: 99 mmol/L (ref 96–106)
Creatinine, Ser: 0.83 mg/dL (ref 0.57–1.00)
Glucose: 170 mg/dL — ABNORMAL HIGH (ref 70–99)
Potassium: 4 mmol/L (ref 3.5–5.2)
Sodium: 140 mmol/L (ref 134–144)
eGFR: 78 mL/min/{1.73_m2} (ref >59)

## 2021-02-08 LAB — PRO B NATRIURETIC PEPTIDE: NT-Pro BNP: 13 pg/mL (ref 0–301)

## 2021-02-08 LAB — MAGNESIUM: Magnesium: 1.8 mg/dL (ref 1.6–2.3)

## 2021-02-14 ENCOUNTER — Other Ambulatory Visit: Payer: Self-pay

## 2021-02-14 ENCOUNTER — Other Ambulatory Visit (HOSPITAL_BASED_OUTPATIENT_CLINIC_OR_DEPARTMENT_OTHER): Payer: Self-pay

## 2021-02-14 ENCOUNTER — Inpatient Hospital Stay: Payer: Medicare Other | Attending: Oncology

## 2021-02-14 ENCOUNTER — Telehealth: Payer: Self-pay

## 2021-02-14 ENCOUNTER — Ambulatory Visit: Payer: Medicare Other | Attending: Internal Medicine

## 2021-02-14 DIAGNOSIS — Z23 Encounter for immunization: Secondary | ICD-10-CM

## 2021-02-14 DIAGNOSIS — I82403 Acute embolism and thrombosis of unspecified deep veins of lower extremity, bilateral: Secondary | ICD-10-CM

## 2021-02-14 DIAGNOSIS — Z7901 Long term (current) use of anticoagulants: Secondary | ICD-10-CM | POA: Diagnosis not present

## 2021-02-14 DIAGNOSIS — Z86718 Personal history of other venous thrombosis and embolism: Secondary | ICD-10-CM | POA: Diagnosis not present

## 2021-02-14 LAB — PROTIME-INR
INR: 1.9 — ABNORMAL HIGH (ref 0.8–1.2)
Prothrombin Time: 21.7 seconds — ABNORMAL HIGH (ref 11.4–15.2)

## 2021-02-14 MED ORDER — PFIZER COVID-19 VAC BIVALENT 30 MCG/0.3ML IM SUSP
INTRAMUSCULAR | 0 refills | Status: DC
Start: 2021-02-14 — End: 2021-02-21
  Filled 2021-02-14: qty 0.3, 1d supply, fill #0

## 2021-02-14 MED ORDER — PNEUMOCOCCAL 20-VAL CONJ VACC 0.5 ML IM SUSY
PREFILLED_SYRINGE | INTRAMUSCULAR | 0 refills | Status: DC
Start: 2021-02-14 — End: 2021-02-21
  Filled 2021-02-14: qty 0.5, 1d supply, fill #0

## 2021-02-14 NOTE — Telephone Encounter (Signed)
Pt verbalized understanding.

## 2021-02-14 NOTE — Progress Notes (Signed)
   Covid-19 Vaccination Clinic  Name:  [name]    MRN: [id number] DOB: [birth year]  02/14/2021  [name] was observed post Covid-19 immunization for 15 minutes without incident. She was provided with Vaccine Information Sheet and instruction to access the V-Safe system.   [name] was instructed to call 911 with any severe reactions post vaccine: Difficulty breathing  Swelling of face and throat  A fast heartbeat  A bad rash all over body  Dizziness and weakness   Immunizations Administered     Name Date Dose VIS Date Route   Pfizer Covid-19 Vaccine Bivalent Booster 02/14/2021 10:05 AM 0.3 mL 11/17/2020 Intramuscular   Manufacturer: [place]   Lot: [id number]   [place]: [phone]

## 2021-02-14 NOTE — Telephone Encounter (Signed)
-----   Message from Ladell Pier, MD sent at 02/14/2021  2:01 PM EST ----- Please call patient, same Coumadin dose, PT INR in 1 month

## 2021-02-15 ENCOUNTER — Other Ambulatory Visit (HOSPITAL_BASED_OUTPATIENT_CLINIC_OR_DEPARTMENT_OTHER): Payer: Self-pay

## 2021-02-17 ENCOUNTER — Other Ambulatory Visit: Payer: Self-pay | Admitting: Oncology

## 2021-02-18 ENCOUNTER — Encounter (HOSPITAL_COMMUNITY): Payer: Self-pay | Admitting: Emergency Medicine

## 2021-02-18 ENCOUNTER — Emergency Department (HOSPITAL_COMMUNITY): Payer: Medicare Other

## 2021-02-18 ENCOUNTER — Telehealth: Payer: Self-pay | Admitting: Pharmacist

## 2021-02-18 ENCOUNTER — Emergency Department (HOSPITAL_COMMUNITY)
Admission: EM | Admit: 2021-02-18 | Discharge: 2021-02-19 | Disposition: A | Discharge status: Home / Self Care | Payer: Medicare Other | Attending: Emergency Medicine | Admitting: Emergency Medicine

## 2021-02-18 DIAGNOSIS — G479 Sleep disorder, unspecified: Secondary | ICD-10-CM | POA: Insufficient documentation

## 2021-02-18 DIAGNOSIS — E119 Type 2 diabetes mellitus without complications: Secondary | ICD-10-CM | POA: Diagnosis not present

## 2021-02-18 DIAGNOSIS — I110 Hypertensive heart disease with heart failure: Secondary | ICD-10-CM | POA: Insufficient documentation

## 2021-02-18 DIAGNOSIS — J449 Chronic obstructive pulmonary disease, unspecified: Secondary | ICD-10-CM | POA: Diagnosis not present

## 2021-02-18 DIAGNOSIS — Z7901 Long term (current) use of anticoagulants: Secondary | ICD-10-CM | POA: Diagnosis not present

## 2021-02-18 DIAGNOSIS — Z96653 Presence of artificial knee joint, bilateral: Secondary | ICD-10-CM | POA: Insufficient documentation

## 2021-02-18 DIAGNOSIS — R600 Localized edema: Secondary | ICD-10-CM | POA: Diagnosis not present

## 2021-02-18 DIAGNOSIS — Z7984 Long term (current) use of oral hypoglycemic drugs: Secondary | ICD-10-CM | POA: Insufficient documentation

## 2021-02-18 DIAGNOSIS — Z743 Need for continuous supervision: Secondary | ICD-10-CM | POA: Diagnosis not present

## 2021-02-18 DIAGNOSIS — R5383 Other fatigue: Secondary | ICD-10-CM | POA: Diagnosis not present

## 2021-02-18 DIAGNOSIS — Z79899 Other long term (current) drug therapy: Secondary | ICD-10-CM | POA: Insufficient documentation

## 2021-02-18 DIAGNOSIS — J45909 Unspecified asthma, uncomplicated: Secondary | ICD-10-CM | POA: Insufficient documentation

## 2021-02-18 DIAGNOSIS — I10 Essential (primary) hypertension: Secondary | ICD-10-CM | POA: Diagnosis not present

## 2021-02-18 DIAGNOSIS — R531 Weakness: Secondary | ICD-10-CM | POA: Diagnosis not present

## 2021-02-18 DIAGNOSIS — E039 Hypothyroidism, unspecified: Secondary | ICD-10-CM | POA: Diagnosis not present

## 2021-02-18 DIAGNOSIS — I5032 Chronic diastolic (congestive) heart failure: Secondary | ICD-10-CM | POA: Diagnosis not present

## 2021-02-18 DIAGNOSIS — R519 Headache, unspecified: Secondary | ICD-10-CM | POA: Diagnosis not present

## 2021-02-18 DIAGNOSIS — R0602 Shortness of breath: Secondary | ICD-10-CM | POA: Diagnosis not present

## 2021-02-18 DIAGNOSIS — R6889 Other general symptoms and signs: Secondary | ICD-10-CM | POA: Diagnosis not present

## 2021-02-18 LAB — URINALYSIS, ROUTINE W REFLEX MICROSCOPIC
Bilirubin Urine: NEGATIVE
Glucose, UA: >=500 mg/dL — AB
Hgb urine dipstick: NEGATIVE
Ketones, ur: NEGATIVE mg/dL
Leukocytes,Ua: NEGATIVE
Nitrite: NEGATIVE
Protein, ur: NEGATIVE mg/dL
Specific Gravity, Urine: 1.023 (ref 1.005–1.030)
pH: 5 (ref 5.0–8.0)

## 2021-02-18 LAB — BASIC METABOLIC PANEL
Anion gap: 12 (ref 5–15)
BUN: 19 mg/dL (ref 8–23)
CO2: 24 mmol/L (ref 22–32)
Calcium: 9.6 mg/dL (ref 8.9–10.3)
Chloride: 103 mmol/L (ref 98–111)
Creatinine, Ser: 0.86 mg/dL (ref 0.44–1.00)
GFR, Estimated: >60 mL/min (ref >60)
Glucose, Bld: 209 mg/dL — ABNORMAL HIGH (ref 70–99)
Potassium: 3.9 mmol/L (ref 3.5–5.1)
Sodium: 139 mmol/L (ref 135–145)

## 2021-02-18 LAB — CBC
HCT: 39.4 % (ref 36.0–46.0)
Hemoglobin: 12.6 g/dL (ref 12.0–15.0)
MCH: 27.6 pg (ref 26.0–34.0)
MCHC: 32 g/dL (ref 30.0–36.0)
MCV: 86.4 fL (ref 80.0–100.0)
Platelets: 348 10*3/uL (ref 150–400)
RBC: 4.56 MIL/uL (ref 3.87–5.11)
RDW: 16.4 % — ABNORMAL HIGH (ref 11.5–15.5)
WBC: 5.4 10*3/uL (ref 4.0–10.5)
nRBC: 0 % (ref 0.0–0.2)

## 2021-02-18 LAB — TROPONIN I (HIGH SENSITIVITY)
Troponin I (High Sensitivity): 9 ng/L (ref <18)
Troponin I (High Sensitivity): 11 ng/L (ref <18)

## 2021-02-18 LAB — PROTIME-INR
INR: 1.1 (ref 0.8–1.2)
Prothrombin Time: 14 seconds (ref 11.4–15.2)

## 2021-02-18 NOTE — Chronic Care Management (AMB) (Signed)
Chronic Care Management Pharmacy Assistant   Name: [name]  MRN: [id number] DOB: 1956-01-02  Reason for Encounter: Disease State and Medication Review / Hypertension Assessment Call and Medication Coordination Call   Conditions to be addressed/monitored: HTN   Recent office visits:  02/02/2021 [name] MD - Patient was seen for SOB and additional issues. Started Vit D 50,000 units 1 every 7 days. Increased Gabapentin to 300 mg twice daily. Follow up if symptoms worsen or fail to improve.  Recent consult visits:  02/07/2021 Gwyndolyn Kaufman MD (cardiology) - Patient was seen for Acute on chronic heart failure with preserved ejection fraction and additional issues. No medication changes. Follow up in 2 weeks.  02/07/2021 Deloria Lair MD (Opthamology) - Patient was seen for vitreomacular adhesions of both eyes. No medication changes. Follow up in 1 year.  02/03/2021 [name] MD (cardiology) - Patient was seen for Acute on chronic heart failure with preserved ejection fraction and additional issues. Started Spironolactone 12.5 mg daily, Increased Furosemide to 80 mg twice daily, decreased Zonisamide to 50 mg 1 in the AM and 2 in the PM. Discontinued Loratadine and Tramadol. Follow up in 4 days.   Hospital visits:  None  Medications: Outpatient Encounter Medications as of 02/18/2021  Medication Sig   ACCU-CHEK GUIDE test strip USE UP TO FOUR TIMES DAILY AS DIRECTED.   Accu-Chek Softclix Lancets lancets USE UP TO FOUR TIMES DAILY AS DIRECTED.   albuterol (PROVENTIL HFA;VENTOLIN HFA) 108 (90 BASE) MCG/ACT inhaler Inhale 2 puffs into the lungs every 4 (four) hours as needed for shortness of breath.   albuterol (PROVENTIL) (2.5 MG/3ML) 0.083% nebulizer solution USE 1 VIAL IN NEBULIZER EVERY 6 HOURS - and as needed   blood glucose meter kit and supplies KIT Dispense based on patient and insurance preference. Use up to four times daily as directed. (FOR ICD-9  250.00, 250.01).   brexpiprazole (REXULTI) 2 MG TABS tablet Take 1 tablet by mouth daily.   budesonide-formoterol (SYMBICORT) 160-4.5 MCG/ACT inhaler Inhale 2 puffs into the lungs 2 (two) times daily.   canagliflozin (INVOKANA) 100 MG TABS tablet Take 1 tablet (100 mg total) by mouth daily before breakfast.   CARTIA XT 240 MG 24 hr capsule Take 1 capsule (240 mg total) by mouth daily.   cloNIDine (CATAPRES) 0.1 MG tablet TAKE ONE TABLET BY MOUTH BEFORE BREAKFAST and TAKE ONE TABLET BY MOUTH EVERYDAY AT BEDTIME   COVID-19 mRNA bivalent vaccine, Pfizer, (PFIZER COVID-19 VAC BIVALENT) injection Inject into the muscle.   diclofenac sodium (VOLTAREN) 1 % GEL Apply 2 g topically daily as needed (fibromyalgia pain).   DULoxetine (CYMBALTA) 20 MG capsule Take 20 mg by mouth daily.   DULoxetine (CYMBALTA) 60 MG capsule Take 60 mg by mouth at bedtime.    fluticasone (FLONASE) 50 MCG/ACT nasal spray Place 2 sprays into both nostrils 2 (two) times daily.   furosemide (LASIX) 80 MG tablet Take 1 tablet (80 mg total) by mouth 2 (two) times daily.   gabapentin (NEURONTIN) 300 MG capsule Take 1 capsule (300 mg total) by mouth 2 (two) times daily.   hydrochlorothiazide (HYDRODIURIL) 12.5 MG tablet    levocetirizine (XYZAL) 5 MG tablet Take 1 tablet (5 mg total) by mouth at bedtime.   LINZESS 145 MCG CAPS capsule Take 1 capsule (145 mcg total) by mouth every other day.   meclizine (ANTIVERT) 25 MG tablet Take 1 tablet (25 mg total) by mouth 3 (three) times daily as needed for dizziness.  metFORMIN (GLUCOPHAGE) 500 MG tablet Take 1 tablet (500 mg total) by mouth at bedtime.   metoCLOPramide (REGLAN) 10 MG tablet Take 1 tablet (10 mg total) by mouth every 6 (six) hours as needed for nausea (or headache).   omeprazole (PRILOSEC) 20 MG capsule Take 1 capsule (20 mg total) by mouth 2 (two) times daily.   pneumococcal 20-valent conjugate vaccine (PREVNAR 20) 0.5 ML injection Inject into the muscle.   potassium  chloride (MICRO-K) 10 MEQ CR capsule TAKE TWO CAPSULES BY MOUTH EVERYDAY AT BEDTIME   pravastatin (PRAVACHOL) 40 MG tablet TAKE 1 TABLET(40 MG) BY MOUTH EVERY EVENING   spironolactone (ALDACTONE) 25 MG tablet Take 0.5 tablets (12.5 mg total) by mouth daily.   umeclidinium-vilanterol (ANORO ELLIPTA) 62.5-25 MCG/INH AEPB Inhale into the lungs.   Vitamin D, Ergocalciferol, (DRISDOL) 1.25 MG (50000 UNIT) CAPS capsule Take 1 capsule (50,000 Units total) by mouth every 7 (seven) days.   warfarin (COUMADIN) 10 MG tablet TAKE 1 TABLET BY MOUTH ONCE A DAY ON MONDAYS, WEDNESDAYS, AND FRIDAYS   warfarin (COUMADIN) 5 MG tablet TAKE ONE TABLET BY MOUTH daily EXCEPT MONDAY, WEDNESDAY, AND friday OR AS DIRECTED   zonisamide (ZONEGRAN) 50 MG capsule 1 TABLET AM AND 2 TABLETS PM   No facility-administered encounter medications on file as of 02/18/2021.  Fill History: Rexulti 3 mg tablet 01/31/2021 30   Invokana 100 mg tablet 01/25/2021 30   duloxetine 20 mg capsule,delayed release 02/01/2021 30   VITAMIN D2 50000IU (ERGO) CAP RX 02/03/2021 84   furosemide 80 mg tablet 02/03/2021 30   GABAPENTIN 300MG CAPSULES 01/21/2021 30   LEVOCETIRIZINE 5MG TABLETS 12/21/2020 90   omeprazole 20 mg capsule,delayed release 01/25/2021 30   potassium chloride ER 10 mEq capsule,extended release 01/25/2021 30   pravastatin 40 mg tablet 01/26/2021 30   spironolactone 25 mg tablet 02/03/2021 30   warfarin 5 mg tablet 01/25/2021 30   clonidine HCl 0.1 mg tablet 01/25/2021 30   diltiazem CD 240 mg capsule,extended release 24 hr 01/25/2021 30   Linzess 145 mcg capsule 12/27/2020 60   metformin 500 mg tablet 01/25/2021 30   Reviewed chart prior to disease state call. Spoke with patient regarding BP  Recent Office Vitals: BP Readings from Last 3 Encounters:  02/07/21 (!) 156/96  02/03/21 (!) 142/92  02/02/21 (!) 160/98   Pulse Readings from Last 3 Encounters:  02/07/21 90  02/03/21 80  02/02/21 85    Wt  Readings from Last 3 Encounters:  02/07/21 293 lb (132.9 kg)  02/03/21 292 lb (132.5 kg)  02/02/21 290 lb 12.8 oz (131.9 kg)     Kidney Function Lab Results  Component Value Date/Time   CREATININE 0.83 02/07/2021 03:16 PM   CREATININE 0.79 02/02/2021 02:29 PM   CREATININE 0.81 02/02/2020 11:26 AM   GFR 78.36 02/02/2021 02:29 PM   GFRNONAA >60 08/18/2020 04:27 PM   GFRNONAA 77 02/02/2020 11:26 AM   GFRAA 94 03/05/2020 11:55 AM   GFRAA 89 02/02/2020 11:26 AM    BMP Latest Ref Rng & Units 02/07/2021 02/02/2021 08/18/2020  Glucose 70 - 99 mg/dL 170(H) 138(H) 123(H)  BUN 8 - 27 mg/dL 21 10 13   Creatinine 0.57 - 1.00 mg/dL 0.83 0.79 0.70  BUN/Creat Ratio 12 - 28 25 - -  Sodium 134 - 144 mmol/L 140 139 139  Potassium 3.5 - 5.2 mmol/L 4.0 3.4(L) 4.0  Chloride 96 - 106 mmol/L 99 102 105  CO2 20 - 29 mmol/L 27 26 24  Calcium 8.7 - 10.3 mg/dL 9.7 9.1 8.8(L)   SCHEDULE FOLLOW UP Current antihypertensive regimen:  Cartia XT 240 mg daily HCTZ 12.5 mg   How often are you checking your Blood Pressure?  Current home BP readings:  What recent interventions/DTPs have been made by any provider to improve Blood Pressure control since last CPP Visit:  Any recent hospitalizations or ED visits since last visit with CPP? No What diet changes have been made to improve Blood Pressure Control?  What exercise is being done to improve your Blood Pressure Control?    Adherence Review: Is the patient currently on ACE/ARB medication? No Does the patient have >5 day gap between last estimated fill dates? No   Reviewed chart for medication changes ahead of medication coordination call.  No OVs, Consults, or hospital visits since last care coordination call/Pharmacist visit. (If appropriate, list visit date, provider name)  No medication changes indicated OR if recent visit, treatment plan here.  BP Readings from Last 3 Encounters:  02/07/21 (!) 156/96  02/03/21 (!) 142/92  02/02/21 (!) 160/98     Lab Results  Component Value Date   HGBA1C 8.0 (H) 02/02/2021     Patient obtains medications through Adherence Packaging  30 Days   Last adherence delivery included: Rexulti 3 mg - take 1 tablet at bedtime Duloxetine 20 mg  - take 1 tablet before breakfast Duloxetine 60 mg - take 1 tablet at bedtime Clonidine 0.1 mg - take 1 tablet two times daily Warfarin 5 mg - TAKE 1 tablet daily except  Monday, Wednesday & Friday OR AS DIRECTED Cartia XT 240 mg - Take 1 capsule  daily Furosemide 20 mg - Take 1 tablet before breakfast Metformin 500 mg - take 1 tablet at bedtime Invokana 100 mg - take 1 tablet before breakfast Potassium CL ER 15mq take 2 capsules at bedtime Pravastatin 40 mg  - TAKE 1 TABLET EVERY EVENING Omeprazole - 20 mg - Take 1 capsule 2 times daily  Patient declined (meds) last month: None  Patient is due for next adherence delivery on: 03/02/2021.  Called patient and reviewed medications and coordinated delivery.  This delivery to include: Rexulti 3 mg - take 1 tablet at bedtime Duloxetine 20 mg  - take 1 tablet before breakfast Duloxetine 60 mg - take 1 tablet at bedtime Clonidine 0.1 mg - take 1 tablet two times daily Warfarin 5 mg - TAKE 1 tablet daily except  Monday, Wednesday & Friday OR AS DIRECTED Cartia XT 240 mg - Take 1 capsule  daily Furosemide 20 mg - Take 1 tablet before breakfast Metformin 500 mg - take 1 tablet at bedtime Invokana 100 mg - take 1 tablet before breakfast Potassium CL ER 118m take 2 capsules at bedtime Pravastatin 40 mg  - TAKE 1 TABLET EVERY EVENING Omeprazole - 20 mg - Take 1 capsule 2 times daily Linzess 145 mcg - Take one capsule every other day  Patient will need a short fill:  Coordinated acute fill:  Patient declined the following medications:  Confirmed delivery date of , advised patient that pharmacy will contact them the morning of delivery.  Unable to reach patient after several attempts  Care Gaps: AWV -  scheduled 03/02/2021 Last BP - 156/96 on 02/07/2021 Last A1C - 8.0 on 02/02/2021 Hep C Screen - never done Papsmear - never done Foot exam - overdue  Star Rating Drugs: Invokana 100 mg - last filled 12/27/2020 30 DS at [facility] Pravachol 40 mg - last  filled 01/03/2021 30 DS at [facility] Metformin 500 mg - last filled 01/25/2021 30 DS at [facility] [phone]

## 2021-02-18 NOTE — ED Triage Notes (Addendum)
Patient complains of generalized weakness that started two or three months ago that got worse approximately one week ago and was then accompanied by a headache that is worse at night. Patient states there are new changes in her weakness today compared the last week.   EMS Report Patient BIB [facility] from home for evaluation of generalized weakness that was present when patent woke this morning. Patient called PCP and was told to call 911 for stroke evaluation. Patient has no unilateral or focal weakness, is alert and oriented, and in no apparent distress at this time.

## 2021-02-18 NOTE — ED Provider Notes (Signed)
Emergency Medicine Provider Triage Evaluation Note  [name] , a 65 y.o. female  was evaluated in triage.  Pt complains of "facial twisting."  She states that yesterday morning around 10 AM she felt like suddenly she had a "twisting of her mouth.  She states after that she began having difficulty swallowing and drooling.  She states that this happened 2 or 3 times over the course of the day.  She also notes that she generally feels weak and fatigued.  She also notes chest pain or shortness of breath that has been ongoing for months.  Review of Systems  Positive: See above Negative:   Physical Exam  BP 135/88 (BP Location: Left Arm)   Pulse 87   Temp 98 F (36.7 C) (Oral)   Resp 14   SpO2 97%  Gen:   Awake, no distress   Resp:  Normal effort, lungs clear MSK:   Moves extremities without difficulty.  Strength 5 out of 5 in bilateral upper and lower extremities Other:  There is mild right-sided facial droop present on exam.  Tongue deviates just left of midline.  No other cranial nerve deficits.  Medical Decision Making  Medically screening exam initiated at 1:05 PM.  Appropriate orders placed.  [name] was informed that the remainder of the evaluation will be completed by another provider, this initial triage assessment does not replace that evaluation, and the importance of remaining in the ED until their evaluation is complete.  Outside of code stroke window.  Will obtain CT head.   Mickie Hillier, PA-C 02/18/21 1306    Horton, Alvin Critchley, DO 02/18/21 1644

## 2021-02-19 ENCOUNTER — Other Ambulatory Visit: Payer: Self-pay

## 2021-02-19 DIAGNOSIS — G479 Sleep disorder, unspecified: Secondary | ICD-10-CM | POA: Diagnosis not present

## 2021-02-19 LAB — CBG MONITORING, ED: Glucose-Capillary: 199 mg/dL — ABNORMAL HIGH (ref 70–99)

## 2021-02-19 NOTE — ED Provider Notes (Signed)
[facility] EMERGENCY DEPARTMENT Provider Note  CSN: [id number] Arrival date & time: 02/18/21 1243  Chief Complaint(s) Weakness  HPI [name] is a 65 y.o. female with a past medical history listed below who presents to the emergency department with several months of generalized fatigue described as tiredness.  Patient reports having difficulty getting to sleep.  States that she cannot use her CPAP.  Reports that she is scheduled follow-up to have a sleep study.  Tiredness is constant but worse with activity.  She endorses intermittent dyspnea on exertion.  She reports intermittent chest discomfort.  No alleviating or aggravating factors.  No recent fevers or infections.  No coughing or congestion.  No nausea or vomiting.  No diarrhea.  No urinary symptoms.  Patient did report having facial twinges today.  Intermittent.  Lasting few seconds.  Occur 2-3 times throughout the course of the day.  Reported that she felt like she was drooling. No difficulty speaking.  She believed that she had more difficulty swallowing.  This has resolved.  No focal weakness, or visual disturbance.   Weakness  Past Medical History Past Medical History:  Diagnosis Date   Adrenal mass ([place]) 03/226   Benign   Arthritis    knees/multiple orthopedic conditons; lower back   Asthma    per pt   Clotting disorder (HCC)    +beta-2-glycoprotein IgA antibody   COPD (chronic obstructive pulmonary disease) (HCC)    inhalers dependent on environment   Depression    Diabetes mellitus    120s usually fasting -  dx more than 10 yrs ago   Dizziness, nonspecific    DVT (deep venous thrombosis) (HCC)    Recurrent   Fibromyalgia    GERD (gastroesophageal reflux disease)    Heart murmur    History of blood transfusion    History of cocaine abuse ([place])    Remote history    Hyperlipidemia    Hypertension    takes meds daily   Hypothyroidism    Lupus Anticoagulant Positive    On home oxygen  therapy    at night   Shortness of breath    exertion or lying flat   Sleep apnea    2l of oxygen at night (as of 12/6, she used to)   Patient Active Problem List   Diagnosis Date Noted   Allergic rhinitis 11/29/2020   Allergic rhinitis due to pollen 11/29/2020   Moderate persistent asthma, uncomplicated 94/32/0037   Osteoarthritis of spine with radiculopathy, lumbar region 08/24/2020   Chronic back pain 08/18/2020   Chronic heart failure with preserved ejection fraction ([place]) 02/27/2020   Obesity, diabetes, and hypertension syndrome ([place]) 02/27/2020   Vitreomacular adhesion of both eyes 02/09/2020   Pseudophakia of both eyes 02/09/2020   Chronic diastolic CHF (congestive heart failure) ([place]) 03/12/2019   Acute respiratory failure with hypoxia ([place]) 03/09/2019   Hypernatremia 03/09/2019   Suspected COVID-19 virus infection 03/09/2019   Acute encephalopathy 03/09/2019   Right sided weakness 02/20/2019   Thyroid mass 02/27/2017   Chronic pain of right knee 05/24/2016   Effusion, right knee 04/28/2016   Presence of right artificial knee joint 04/28/2016   Abnormal LFTs    Decreased sensation    Paresthesia 06/12/2014   Obstruction of kidney 04/24/2014   Diabetes type 2, uncontrolled 04/24/2014   Chronic anticoagulation 04/24/2014   Crack cocaine use 04/24/2014   Depression 04/24/2014   Fibromyalgia 04/24/2014   COPD (chronic obstructive pulmonary disease) ([place]) 04/24/2014  OSA (obstructive sleep apnea) 04/24/2014   Lupus anticoagulant positive 04/24/2014   Adrenal mass, right ([place]) 04/24/2014   Right kidney mass 04/24/2014   Hydronephrosis of right kidney 04/24/2014   Supratherapeutic INR 04/24/2014   Renal mass, right 04/24/2014   Right lower quadrant abdominal pain    Morbid obesity ([place]) 08/21/2013   Constipation 08/19/2013   COPD with acute exacerbation ([place]) 08/17/2013   Lumbosacral spondylosis without myelopathy 11/19/2012   Morbid obesity with BMI of 45.0-49.9,  adult ([place]) 01/05/2012   Bilateral deep vein thromboses ([place]) 12/13/2011   Abdominal pain 11/05/2011   Hypokalemia 11/05/2011   Sciatica 11/05/2011   Diabetes mellitus type 2 in obese ([place]) 11/05/2011   HTN (hypertension) 11/05/2011   Home Medication(s) Prior to Admission medications   Medication Sig Start Date End Date Taking? Authorizing Provider  ACCU-CHEK GUIDE test strip USE UP TO FOUR TIMES DAILY AS DIRECTED. 08/12/19   Billie Ruddy, MD  Accu-Chek Softclix Lancets lancets USE UP TO FOUR TIMES DAILY AS DIRECTED. 08/12/19   Billie Ruddy, MD  albuterol (PROVENTIL HFA;VENTOLIN HFA) 108 (90 BASE) MCG/ACT inhaler Inhale 2 puffs into the lungs every 4 (four) hours as needed for shortness of breath.    [provider]  albuterol (PROVENTIL) (2.5 MG/3ML) 0.083% nebulizer solution USE 1 VIAL IN NEBULIZER EVERY 6 HOURS - and as needed 03/22/20   Billie Ruddy, MD  blood glucose meter kit and supplies KIT Dispense based on patient and insurance preference. Use up to four times daily as directed. (FOR ICD-9 250.00, 250.01). 07/16/19   Billie Ruddy, MD  brexpiprazole (REXULTI) 2 MG TABS tablet Take 1 tablet by mouth daily.    [provider]  budesonide-formoterol (SYMBICORT) 160-4.5 MCG/ACT inhaler Inhale 2 puffs into the lungs 2 (two) times daily.    [provider]  canagliflozin (INVOKANA) 100 MG TABS tablet Take 1 tablet (100 mg total) by mouth daily before breakfast. 10/25/20   Chandrasekhar, [name] A, MD  CARTIA XT 240 MG 24 hr capsule Take 1 capsule (240 mg total) by mouth daily. 11/05/20   Billie Ruddy, MD  cloNIDine (CATAPRES) 0.1 MG tablet TAKE ONE TABLET BY MOUTH BEFORE BREAKFAST and TAKE ONE TABLET BY MOUTH EVERYDAY AT BEDTIME 12/27/20   Billie Ruddy, MD  COVID-19 mRNA bivalent vaccine, Pfizer, (PFIZER COVID-19 VAC BIVALENT) injection Inject into the muscle. 02/14/21   Carlyle Basques, MD  diclofenac sodium (VOLTAREN) 1 % GEL Apply 2 g topically  daily as needed (fibromyalgia pain).    [provider]  DULoxetine (CYMBALTA) 20 MG capsule Take 20 mg by mouth daily. 01/11/18   [provider]  DULoxetine (CYMBALTA) 60 MG capsule Take 60 mg by mouth at bedtime.     [provider]  fluticasone (FLONASE) 50 MCG/ACT nasal spray Place 2 sprays into both nostrils 2 (two) times daily.    [provider]  furosemide (LASIX) 80 MG tablet Take 1 tablet (80 mg total) by mouth 2 (two) times daily. 02/03/21   Chandrasekhar, Terisa Starr, MD  gabapentin (NEURONTIN) 300 MG capsule Take 1 capsule (300 mg total) by mouth 2 (two) times daily. 01/21/21   Billie Ruddy, MD  hydrochlorothiazide (HYDRODIURIL) 12.5 MG tablet     [provider]  levocetirizine (XYZAL) 5 MG tablet Take 1 tablet (5 mg total) by mouth at bedtime. 11/05/20   Billie Ruddy, MD  LINZESS 145 MCG CAPS capsule Take 1 capsule (145 mcg total) by mouth every  other day. 08/11/20   Billie Ruddy, MD  meclizine (ANTIVERT) 25 MG tablet Take 1 tablet (25 mg total) by mouth 3 (three) times daily as needed for dizziness. 09/29/16   Charlesetta Shanks, MD  metFORMIN (GLUCOPHAGE) 500 MG tablet Take 1 tablet (500 mg total) by mouth at bedtime. 11/05/20   Billie Ruddy, MD  metoCLOPramide (REGLAN) 10 MG tablet Take 1 tablet (10 mg total) by mouth every 6 (six) hours as needed for nausea (or headache). 06/02/38   Delora Fuel, MD  omeprazole (PRILOSEC) 20 MG capsule Take 1 capsule (20 mg total) by mouth 2 (two) times daily. 11/05/20   Billie Ruddy, MD  pneumococcal 20-valent conjugate vaccine (PREVNAR 20) 0.5 ML injection Inject into the muscle. 02/14/21     potassium chloride (MICRO-K) 10 MEQ CR capsule TAKE TWO CAPSULES BY MOUTH EVERYDAY AT BEDTIME 01/19/21   Billie Ruddy, MD  pravastatin (PRAVACHOL) 40 MG tablet TAKE 1 TABLET(40 MG) BY MOUTH EVERY EVENING 11/05/20   Billie Ruddy, MD  spironolactone (ALDACTONE) 25 MG tablet Take 0.5 tablets (12.5 mg  total) by mouth daily. 02/03/21   Chandrasekhar, Terisa Starr, MD  umeclidinium-vilanterol (ANORO ELLIPTA) 62.5-25 MCG/INH AEPB Inhale into the lungs.    [provider]  Vitamin D, Ergocalciferol, (DRISDOL) 1.25 MG (50000 UNIT) CAPS capsule Take 1 capsule (50,000 Units total) by mouth every 7 (seven) days. 02/03/21   Billie Ruddy, MD  warfarin (COUMADIN) 10 MG tablet TAKE 1 TABLET BY MOUTH ONCE A DAY ON MONDAYS, WEDNESDAYS, AND FRIDAYS 09/01/20   Ladell Pier, MD  warfarin (COUMADIN) 5 MG tablet TAKE ONE TABLET BY MOUTH daily EXCEPT MONDAY, WEDNESDAY, AND friday OR AS DIRECTED 02/17/21   Ladell Pier, MD  zonisamide (ZONEGRAN) 50 MG capsule 1 TABLET AM AND 2 TABLETS PM    [provider]                                                                                                                                    Past Surgical History Past Surgical History:  Procedure Laterality Date   ABDOMINAL HYSTERECTOMY  1989   Fibroids   ANTERIOR LUMBAR FUSION  01/03/2012   Procedure: ANTERIOR LUMBAR FUSION 1 LEVEL;  Surgeon: Winfield Cunas, MD;  Location: [place];  Service: Neurosurgery;  Laterality: N/A;  Lumbar Four-Five Anterior Lumbar Interbody Fusion with Instrumentation   BACK SURGERY     cervical spine---disk disease   CARPAL TUNNEL RELEASE     Bilateral   CESAREAN SECTION     CHOLECYSTECTOMY     CYSTOSCOPY/RETROGRADE/URETEROSCOPY/STONE EXTRACTION WITH BASKET Right 04/26/2014   Procedure: CYSTOSCOPY/ RIGHT RETROGRADE/ RIGHT URETEROSCOPY/URETERAL AND RENAL PELVIS BIOPSY;  Surgeon: Alexis Frock, MD;  Location: [facility];  Service: Urology;  Laterality: Right;   gallstones removed     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   bilateral   right elbow surgery  THYROID LOBECTOMY Right 02/27/2017   THYROID LOBECTOMY Right 02/27/2017   Procedure: RIGHT THYROID LOBECTOMY;  Surgeon: Erroll Luna, MD;  Location: [place];  Service: General;  Laterality: Right;   TUBAL  LIGATION     Family History Family History  Problem Relation Age of Onset   Hypertension Mother    Diabetes Mother    Cancer Mother        Pancreatic   Hypertension Father    Heart failure Father    Hyperlipidemia Other    COPD Other     Social History Social History   Tobacco Use   Smoking status: Never   Smokeless tobacco: Never  Vaping Use   Vaping Use: Never used  Substance Use Topics   Alcohol use: No    Alcohol/week: 0.0 standard drinks    Comment: beer in past    Drug use: Yes    Types: Cocaine    Comment: quit 2003-rehab progam in 2005   Allergies Lisinopril and Corticosteroids  Review of Systems Review of Systems  Neurological:  Positive for weakness.  All other systems are reviewed and are negative for acute change except as noted in the HPI  Physical Exam Vital Signs  I have reviewed the triage vital signs BP (!) 147/66   Pulse 92   Temp 98 F (36.7 C) (Oral)   Resp 20   Ht _0  (1.676 m)   Wt 132.9 kg   SpO2 98%   BMI 47.29 kg/m   Physical Exam Vitals reviewed.  Constitutional:      General: She is not in acute distress.    Appearance: She is well-developed. She is obese. She is not diaphoretic.  HENT:     Head: Normocephalic and atraumatic.     Nose: Nose normal.  Eyes:     General: No scleral icterus.       Right eye: No discharge.        Left eye: No discharge.     Conjunctiva/sclera: Conjunctivae normal.     Pupils: Pupils are equal, round, and reactive to light.  Cardiovascular:     Rate and Rhythm: Normal rate and regular rhythm.     Heart sounds: No murmur heard.   No friction rub. No gallop.  Pulmonary:     Effort: Pulmonary effort is normal. No respiratory distress.     Breath sounds: Normal breath sounds. No stridor. No rales.  Abdominal:     General: There is no distension.     Palpations: Abdomen is soft.     Tenderness: There is no abdominal tenderness.  Musculoskeletal:        General: No tenderness.      Cervical back: Normal range of motion and neck supple.     Right lower leg: 1+ Pitting Edema present.     Left lower leg: 1+ Pitting Edema present.  Skin:    General: Skin is warm and dry.     Findings: No erythema or rash.  Neurological:     Mental Status: She is alert and oriented to person, place, and time.     Comments: Mental Status:  Alert and oriented to person, place, and time.  Attention and concentration normal.  Speech clear.  Recent memory is intact  Cranial Nerves:  II Visual Fields: Intact to confrontation. Visual fields intact. III, IV, VI: Pupils equal and reactive to light and near. Full eye movement without nystagmus  V Facial Sensation: Normal. No weakness of masticatory muscles  VII:  No facial weakness or asymmetry  VIII Auditory Acuity: Grossly normal  IX/X: The uvula is midline; the palate elevates symmetrically  XI: Normal sternocleidomastoid and trapezius strength  XII: The tongue is midline. No atrophy or fasciculations.   Motor System: Muscle Strength: 5/5 and symmetric in the upper and lower extremities. No pronation or drift.  Muscle Tone: Tone and muscle bulk are normal in the upper and lower extremities.  Coordination: Intact finger-to-nose. No tremor.  Sensation: Intact to light touch. Gait: Routine gait antalgic but stable.     ED Results and Treatments Labs (all labs ordered are listed, but only abnormal results are displayed) Labs Reviewed  BASIC METABOLIC PANEL - Abnormal; Notable for the following components:      Result Value   Glucose, Bld 209 (*)    All other components within normal limits  CBC - Abnormal; Notable for the following components:   RDW 16.4 (*)    All other components within normal limits  URINALYSIS, ROUTINE W REFLEX MICROSCOPIC - Abnormal; Notable for the following components:   APPearance HAZY (*)    Glucose, UA >=500 (*)    Bacteria, UA RARE (*)    All other components within normal limits  CBG MONITORING, ED -  Abnormal; Notable for the following components:   Glucose-Capillary 199 (*)    All other components within normal limits  PROTIME-INR  TROPONIN I (HIGH SENSITIVITY)  TROPONIN I (HIGH SENSITIVITY)                                                                                                                         EKG  EKG Interpretation  Date/Time:  Friday February 18 2021 12:52:36 EST Ventricular Rate:  85 PR Interval:  156 QRS Duration: 84 QT Interval:  396 QTC Calculation: 471 R Axis:   21 Text Interpretation: Normal sinus rhythm Minimal voltage criteria for LVH, may be normal variant ( R in aVL ) Nonspecific ST abnormality Abnormal ECG Confirmed by Addison Lank [phone]) on 02/19/2021 3:04:53 AM       Radiology DG Chest 2 View  Result Date: 02/18/2021 CLINICAL DATA:  Shortness of breath. EXAM: CHEST - 2 VIEW COMPARISON:  February 02, 2021. FINDINGS: Stable cardiomediastinal silhouette. Both lungs are clear. The visualized skeletal structures are unremarkable. IMPRESSION: No active cardiopulmonary disease. Electronically Signed   By: Marijo Conception M.D.   On: 02/18/2021 13:29   CT Head Wo Contrast  Result Date: 02/18/2021 CLINICAL DATA:  Weakness headache EXAM: CT HEAD WITHOUT CONTRAST TECHNIQUE: Contiguous axial images were obtained from the base of the skull through the vertex without intravenous contrast. COMPARISON:  MRI 03/10/2019, CT brain 03/09/2019 FINDINGS: Brain: No acute territorial infarction, hemorrhage or intracranial mass. Minimal white matter hypodensity consistent with chronic small vessel ischemic change. Normal ventricle size. Vascular: No hyperdense vessels.  Carotid vascular calcification Skull: Normal. Negative for fracture or focal lesion. Sinuses/Orbits: No acute finding. Other: None IMPRESSION: 1. No CT evidence for acute intracranial  abnormality. 2. Minimal chronic small vessel ischemic changes of the white matter Electronically Signed   By: Donavan Foil  M.D.   On: 02/18/2021 16:42    Pertinent labs & imaging results that were available during my care of the patient were reviewed by me and considered in my medical decision making (see MDM for details).  Medications Ordered in ED Medications - No data to display                                                                                                                                   Procedures Procedures  (including critical care time)  Medical Decision Making / ED Course I have reviewed the nursing notes for this encounter and the patient's prior records (if available in EHR or on provided paperwork).  Gabriel Paulding Rachels was evaluated in Emergency Department on 02/19/2021 for the symptoms described in the history of present illness. She was evaluated in the context of the global COVID-19 pandemic, which necessitated consideration that the patient might be at risk for infection with the SARS-CoV-2 virus that causes COVID-19. Institutional protocols and algorithms that pertain to the evaluation of patients at risk for COVID-19 are in a state of rapid change based on information released by regulatory bodies including the CDC and federal and state organizations. These policies and algorithms were followed during the patient's care in the ED.     Generalized fatigue Ongoing for several months. Work-up negative for anemia, CHF exacerbation, infectious process, or ACS. Likely related to difficulty sleeping over the past several months. Patient already has follow-up with PCP for sleep study.  Facial twitching. Brief and intermittent. Resolved. Exam nonfocal. No significant electrolyte derangements. Doubt CVA.   Pertinent labs & imaging results that were available during my care of the patient were reviewed by me and considered in my medical decision making:    Final Clinical Impression(s) / ED Diagnoses Final diagnoses:  Tiredness  Sleep trouble   The patient appears  reasonably screened and/or stabilized for discharge and I doubt any other medical condition or other [facility] requiring further screening, evaluation, or treatment in the ED at this time prior to discharge. Safe for discharge with strict return precautions.  Disposition: Discharge  Condition: Good  I have discussed the results, Dx and Tx plan with the patient/family who expressed understanding and agree(s) with the plan. Discharge instructions discussed at length. The patient/family was given strict return precautions who verbalized understanding of the instructions. No further questions at time of discharge.    ED Discharge Orders     None         Follow Up: Billie Ruddy, MD [place] [address] [phone]  Call  to schedule an appointment for close follow up     This chart was dictated using voice recognition software.  Despite best efforts to proofread,  errors can occur which can change the  documentation meaning.    Fatima Blank, MD 02/19/21 [phone]

## 2021-02-19 NOTE — ED Notes (Signed)
Feeling weak over the last several days, and on Thursday talking on the phone and felt like her mouth twisted and difficulty getting words out and drooling.

## 2021-02-19 NOTE — ED Notes (Signed)
Provider at bedside

## 2021-02-19 NOTE — ED Notes (Signed)
Pt ambulated to restroom with assistance at this time 

## 2021-02-20 NOTE — Progress Notes (Signed)
Cardiology Office Note:    Date:  02/21/2021   ID:  [name], DOB [birth year], MRN [id number]  PCP:  Billie Ruddy, MD  [facility] Cardiologist:  [name] MD [place] Electrophysiologist:  None   CC: Post ED f/u  History of Present Illness:    [name] is a 65 y.o. 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  In interim had ziopatch with no concerning findings.  Last seen 04/13/20.  In interim of this visit, patient CCTA and Echo- notable from improved LAP and aortic atherosclerosis, and a hiatal hernia.  During that visit started new lasix dose.  Seen 12/17/20.  Patient called in 01/31/21 for worsening SOB, PND, and orthopnea.  Added to DOD 02/03/21.  Diuretic increased.  Was improving but had 12/2 eval for possibly having a stroke (w/u negative).  She has lost 4 lbs with higher diuretics.  Patient notes that she was having a TIA. She was told this was related to the fluid pills.    With diuretics she has felt less bloated.  Still can't sleep with her head flat. Notes drooling.     Past Medical History:  Diagnosis Date   Adrenal mass ([place]) 03/226   Benign   Arthritis    knees/multiple orthopedic conditons; lower back   Asthma    per pt   Clotting disorder (HCC)    +beta-2-glycoprotein IgA antibody   COPD (chronic obstructive pulmonary disease) (HCC)    inhalers dependent on environment   Depression    Diabetes mellitus    120s usually fasting -  dx more than 10 yrs ago   Dizziness, nonspecific    DVT (deep venous thrombosis) (HCC)    Recurrent   Fibromyalgia    GERD (gastroesophageal reflux disease)    Heart murmur    History of blood transfusion    History of cocaine abuse ([place])    Remote history    Hyperlipidemia    Hypertension    takes meds daily   Hypothyroidism    Lupus  Anticoagulant Positive    On home oxygen therapy    at night   Shortness of breath    exertion or lying flat   Sleep apnea    2l of oxygen at night (as of 12/6, she used to)    Past Surgical History:  Procedure Laterality Date   ABDOMINAL HYSTERECTOMY  1989   Fibroids   ANTERIOR LUMBAR FUSION  01/03/2012   Procedure: ANTERIOR LUMBAR FUSION 1 LEVEL;  Surgeon: Winfield Cunas, MD;  Location: [facility];  Service: Neurosurgery;  Laterality: N/A;  Lumbar Four-Five Anterior Lumbar Interbody Fusion with Instrumentation   BACK SURGERY     cervical spine---disk disease   CARPAL TUNNEL RELEASE     Bilateral   CESAREAN SECTION     CHOLECYSTECTOMY     CYSTOSCOPY/RETROGRADE/URETEROSCOPY/STONE EXTRACTION WITH BASKET Right 04/26/2014   Procedure: CYSTOSCOPY/ RIGHT RETROGRADE/ RIGHT URETEROSCOPY/URETERAL AND RENAL PELVIS BIOPSY;  Surgeon: Alexis Frock, MD;  Location: [facility];  Service: Urology;  Laterality: Right;   gallstones removed     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   bilateral   right elbow surgery     THYROID LOBECTOMY Right 02/27/2017   THYROID LOBECTOMY Right 02/27/2017   Procedure: RIGHT THYROID LOBECTOMY;  Surgeon: Erroll Luna, MD;  Location: [facility];  Service: General;  Laterality: Right;   TUBAL LIGATION      Current Medications: Current Meds  Medication Sig   ACCU-CHEK GUIDE test strip USE UP TO FOUR TIMES DAILY AS DIRECTED.   Accu-Chek Softclix Lancets lancets USE UP TO FOUR TIMES DAILY AS DIRECTED.   albuterol (PROVENTIL HFA;VENTOLIN HFA) 108 (90 BASE) MCG/ACT inhaler Inhale 2 puffs into the lungs every 4 (four) hours as needed for shortness of breath.   albuterol (PROVENTIL) (2.5 MG/3ML) 0.083% nebulizer solution USE 1 VIAL IN NEBULIZER EVERY 6 HOURS - and as needed   blood glucose meter kit and supplies KIT Dispense based on patient and insurance preference. Use up to four times daily as directed. (FOR ICD-9 250.00, 250.01).   brexpiprazole (REXULTI) 2 MG TABS tablet  Take 1 tablet by mouth daily.   budesonide-formoterol (SYMBICORT) 160-4.5 MCG/ACT inhaler Inhale 2 puffs into the lungs 2 (two) times daily.   canagliflozin (INVOKANA) 100 MG TABS tablet Take 1 tablet (100 mg total) by mouth daily before breakfast.   CARTIA XT 240 MG 24 hr capsule Take 1 capsule (240 mg total) by mouth daily.   cloNIDine (CATAPRES) 0.1 MG tablet TAKE ONE TABLET BY MOUTH BEFORE BREAKFAST and TAKE ONE TABLET BY MOUTH EVERYDAY AT BEDTIME   diclofenac sodium (VOLTAREN) 1 % GEL Apply 2 g topically daily as needed (fibromyalgia pain).   DULoxetine (CYMBALTA) 20 MG capsule Take 20 mg by mouth daily.   DULoxetine (CYMBALTA) 60 MG capsule Take 60 mg by mouth at bedtime.    fluticasone (FLONASE) 50 MCG/ACT nasal spray Place 2 sprays into both nostrils 2 (two) times daily.   furosemide (LASIX) 80 MG tablet Take 1 tablet (80 mg total) by mouth 2 (two) times daily.   gabapentin (NEURONTIN) 300 MG capsule Take 1 capsule (300 mg total) by mouth 2 (two) times daily.   levocetirizine (XYZAL) 5 MG tablet Take 1 tablet (5 mg total) by mouth at bedtime.   LINZESS 145 MCG CAPS capsule Take 1 capsule (145 mcg total) by mouth every other day.   meclizine (ANTIVERT) 25 MG tablet Take 1 tablet (25 mg total) by mouth 3 (three) times daily as needed for dizziness.   metFORMIN (GLUCOPHAGE) 500 MG tablet Take 1 tablet (500 mg total) by mouth at bedtime.   metoCLOPramide (REGLAN) 10 MG tablet Take 1 tablet (10 mg total) by mouth every 6 (six) hours as needed for nausea (or headache).   omeprazole (PRILOSEC) 20 MG capsule Take 1 capsule (20 mg total) by mouth 2 (two) times daily.   potassium chloride (MICRO-K) 10 MEQ CR capsule TAKE TWO CAPSULES BY MOUTH EVERYDAY AT BEDTIME   pravastatin (PRAVACHOL) 40 MG tablet TAKE 1 TABLET(40 MG) BY MOUTH EVERY EVENING   spironolactone (ALDACTONE) 25 MG tablet Take 0.5 tablets (12.5 mg total) by mouth daily.   umeclidinium-vilanterol (ANORO ELLIPTA) 62.5-25 MCG/INH AEPB  Inhale into the lungs.   Vitamin D, Ergocalciferol, (DRISDOL) 1.25 MG (50000 UNIT) CAPS capsule Take 1 capsule (50,000 Units total) by mouth every 7 (seven) days.   warfarin (COUMADIN) 10 MG tablet TAKE 1 TABLET BY MOUTH ONCE A DAY ON MONDAYS, WEDNESDAYS, AND FRIDAYS   warfarin (COUMADIN) 5 MG tablet TAKE ONE TABLET BY MOUTH daily EXCEPT MONDAY, WEDNESDAY, AND friday OR AS DIRECTED   zonisamide (ZONEGRAN) 50 MG capsule 1 TABLET AM AND 2 TABLETS PM   [DISCONTINUED] COVID-19 mRNA bivalent vaccine, Pfizer, (PFIZER COVID-19 VAC BIVALENT) injection Inject into the muscle.   [DISCONTINUED] hydrochlorothiazide (HYDRODIURIL) 12.5  MG tablet    [DISCONTINUED] pneumococcal 20-valent conjugate vaccine (PREVNAR 20) 0.5 ML injection Inject into the muscle.   Current Facility-Administered Medications for the 02/21/21 encounter (Office Visit) with Werner Lean, MD  Medication   sodium chloride flush (NS) 0.9 % injection 3 mL    Allergies:   Lisinopril and Corticosteroids   Social History   Socioeconomic History   Marital status: Divorced    Spouse name: Not on file   Number of children: Not on file   Years of education: Not on file   Highest education level: Not on file  Occupational History   Not on file  Tobacco Use   Smoking status: Never   Smokeless tobacco: Never  Vaping Use   Vaping Use: Never used  Substance and Sexual Activity   Alcohol use: No    Alcohol/week: 0.0 standard drinks    Comment: beer in past    Drug use: Yes    Types: Cocaine    Comment: quit 2003-rehab progam in 2005   Sexual activity: Never  Other Topics Concern   Not on file  Social History Narrative   Lives alone in apartment, drives   Social Determinants of Health   Financial Resource Strain: Low Risk    Difficulty of Paying Living Expenses: Not hard at all  Food Insecurity: No Food Insecurity   Worried About Charity fundraiser in the Last Year: Never true   [place] in the Last Year:  Never true  Transportation Needs: No Transportation Needs   Lack of Transportation (Medical): No   Lack of Transportation (Non-Medical): No  Physical Activity: Inactive   Days of Exercise per Week: 0 days   Minutes of Exercise per Session: 0 min  Stress: No Stress Concern Present   Feeling of Stress : Not at all  Social Connections: Moderately Isolated   Frequency of Communication with Friends and Family: More than three times a week   Frequency of Social Gatherings with Friends and Family: More than three times a week   Attends Religious Services: More than 4 times per year   Active Member of [employer] or Organizations: No   Attends Music therapist: Never   Marital Status: Divorced    Family History: The patient's family history includes COPD in an other family member; Cancer in her mother; Diabetes in her mother; Heart failure in her father; Hyperlipidemia in an other family member; Hypertension in her father and mother. History of coronary artery disease notable for no members. History of heart failure notable for both parents. History of arrhythmia notable for no members. Denies family history of sudden cardiac death including drowning, car accidents, or unexplained deaths in the family. Mother->heart murmur  ROS:   Please see the history of present illness.    All other systems reviewed and are negative.  EKGs/Labs/Other Studies Reviewed:    The following studies were reviewed today:  EKG:  02/03/21: SR rate 80 LVH 04/13/20: SR 92 LVH 02/02/20:  SR Rate 85 LVH with secondary repolarization  Transthoracic Echocardiogram Date:05/04/20  Results:   IMPRESSIONS   1. Left ventricular ejection fraction, by estimation, is 60 to 65%. The  left ventricle has normal function. The left ventricle has no regional  wall motion abnormalities. Left ventricular diastolic parameters are  consistent with Grade I diastolic  dysfunction (impaired relaxation).   2. Right  ventricular systolic function is normal. The right ventricular  size is normal.   3. The mitral  valve is normal in structure. No evidence of mitral valve  regurgitation. No evidence of mitral stenosis.   4. The aortic valve is normal in structure. Aortic valve regurgitation is  not visualized.    Cardiac Monitor: Date: 03/25/20 Results:  Patient had a minimum heart rate of 67 bpm, maximum heart rate of 164 bpm, and average heart rate of 88 bpm. Predominant underlying rhythm was sinus rhythm. One run of ventricular tachycardia occurred lasting 5 beats at longest with a max rate of 164 bpm at fastest. Thirteen runs of supraventricular tachycardia occurred lasting 14 beats at longest with a max rate of 162 bpm at fastest. Isolated PACs were rare (<1.0%), with rare couplets and triplets present. Isolated PVCs were rare (<1.0%), with rare couplets and triplets present; bigeminy and trigeminy present. No evidence of complete heart block . Triggered and diary events associated overwhelming with sinus rhythm or sinus tachycardia; once with PVC.   No malignant arrhythmias.   CardiacCT : Date:05/07/20 Results: IMPRESSION: 1. Coronary calcium score of 0. This was 1st percentile for age, sex, and race matched control. 2. Normal coronary origin with right dominance. 3. CAD-RADS 0. No evidence of CAD (0%). Consider non-atherosclerotic causes of discomfort.   IMPRESSION: 1. No acute findings in the imaged extracardiac chest. 2. Hepatic steatosis. 3. Small hiatal hernia.  Recent Labs: 06/07/2020: TSH 4.23 02/02/2021: ALT 21 02/07/2021: Magnesium 1.8; NT-Pro BNP 13 02/18/2021: BUN 19; Creatinine, Ser 0.86; Hemoglobin 12.6; Platelets 348; Potassium 3.9; Sodium 139  Recent Lipid Panel    Component Value Date/Time   CHOL 194 02/02/2020 1126   TRIG 196 (H) 02/02/2020 1126   HDL 76 02/02/2020 1126   CHOLHDL 2.6 02/02/2020 1126   VLDL 20 04/24/2014 2245   LDLCALC 89 02/02/2020 1126     Physical Exam:    VS:  BP (!) 150/78   Pulse 86   Ht 5' 6" (1.676 m)   Wt 288 lb (130.6 kg)   SpO2 98%   BMI 46.48 kg/m     Wt Readings from Last 3 Encounters:  02/21/21 288 lb (130.6 kg)  02/19/21 293 lb (132.9 kg)  02/07/21 293 lb (132.9 kg)    Gen: No distress, Morbid obesity Neck: No JVD at 90,  thick neck Cardiac: No Rubs or Gallops, II/VI systolic murmur, regular rate and  radial pulses Respiratory: Clear to auscultation bilaterally, normal respiratory  GI: Soft, nontender, distended but improved from prior MS: Non-pitting edema;  moves all extremities Integument: Skin feels warm Neuro:  At time of evaluation, alert and oriented to person/place/time/situation  Psych: Normal affect, patient feels well   ASSESSMENT:    1. Acute on chronic heart failure with preserved ejection fraction ([place])   2. Medication management     PLAN:    In order of problems listed above:  Chronic Heart Failure Preserved Ejection Fraction  In the setting of Morbid Obesity, COPD and OSA overlap DVT on chronic AC - NYHA class IV, Stage C, etiology in the setting of DM/HTN - RHC - Diuretic regimen:Continue Invokana, lasix to 80 mg PO BID - aldactone 12.5 mg PO Daily - stop HCTZ - BMP day of surgery  Morbid Obesity/DM/HTN OSA on CPAP - OSA and is on CPAP follow by OSH Sleep Medicine  HLD - given no significant aortic atherosclerosis on my review -  LDL [place] < 100  Asx SVT on heart monitor - no AF; no symptomatic SVT - AV Nodal Therapy: continue diltiazem   WIll see in  January 2023    Medication Adjustments/Labs and Tests Ordered: Current medicines are reviewed at length with the patient today.  Concerns regarding medicines are outlined above.  Orders Placed This Encounter  Procedures   CBC   Basic metabolic panel   Protime-INR   Basic metabolic panel     Meds ordered this encounter  Medications   sodium chloride flush (NS) 0.9 % injection 3 mL       Patient Instructions  Medication Instructions:  Your physician has recommended you make the following change in your medication:  STOP: hydrochlorothiazide (HCTZ)  *If you need a refill on your cardiac medications before your next appointment, please call your pharmacy*   Lab Work: TODAY: CBC, BMP, PT/INR If you have labs (blood work) drawn today and your tests are completely normal, you will receive your results only by: [place] (if you have MyChart) OR A paper copy in the mail If you have any lab test that is abnormal or we need to change your treatment, we will call you to review the results.   Testing/Procedures: Your physician has requested that you have a cardiac catheterization. Cardiac catheterization is used to diagnose and/or treat various heart conditions. Doctors may recommend this procedure for a number of different reasons. The most common reason is to evaluate chest pain. Chest pain can be a symptom of coronary artery disease (CAD), and cardiac catheterization can show whether plaque is narrowing or blocking your heart's arteries. This procedure is also used to evaluate the valves, as well as measure the blood flow and oxygen levels in different parts of your heart.  Please follow instruction sheet, as given.    Follow-Up: At [facility], you and your health needs are our priority.  As part of our continuing mission to provide you with exceptional heart care, we have created designated Provider Care Teams.  These Care Teams include your primary Cardiologist (physician) and Advanced Practice Providers (APPs -  Physician Assistants and Nurse Practitioners) who all work together to provide you with the care you need, when you need it.   Your next appointment:   1 month(s)  The format for your next appointment:   In Person  Provider:   Werner Lean, MD {    Other Instructions  [facility] OFFICE [place], [place] [address] Dept: [phone] Loc: [place] [facility]  02/21/2021  You are scheduled for a Cardiac Catheterization on Monday, December 12 with Dr. Lauree Chandler.  1. Please arrive at the [facility] (Main Entrance A) at [facility]: [address] [place], [place] [address] at 5:30 AM (This time is two hours before your procedure to ensure your preparation). Free valet parking service is available.   Special note: Every effort is made to have your procedure done on time. Please understand that emergencies sometimes delay scheduled procedures.  2. Diet: Do not eat solid foods after midnight.  The patient may have clear liquids until 5am upon the day of the procedure.  3. Labs: You will need to have blood drawn on Monday, December 5 at [facility]. [address], [place]    4. Medication instructions in preparation for your procedure:   Contrast Allergy: No    Stop taking Coumadin (Warfarin) on Wednesday, December 7.  This is 5 days before your Heart Cath  DO NOT TAKE THE FOLLOWING  MEDICATIONS ON THE MORNING OF HEART CATH:  Invokana  Furosemide (Lasix)  Spironolactone     Do not take Diabetes Med Glucophage (Metformin) on the day of the procedure and HOLD 48 HOURS AFTER THE PROCEDURE.  On the morning of your procedure, take your Aspirin and any morning medicines NOT listed above.  You may use sips of water.  5. Plan for one night stay--bring personal belongings. 6. Bring a current list of your medications and current insurance cards. 7. You MUST have a responsible person to drive you home. 8. Someone MUST be with you the first 24 hours after you arrive home or your discharge will be delayed. 9. Please wear clothes that are easy to get on and off and wear slip-on shoes.  Thank you for allowing [place] to care for you!   -- [facility]  Invasive Cardiovascular services     Signed, Werner Lean, MD  02/21/2021 3:26 PM    [place]

## 2021-02-20 NOTE — H&P (View-Only) (Signed)
Cardiology Office Note:    Date:  02/21/2021   ID:  [name], DOB [birth year], MRN [id number]  PCP:  Billie Ruddy, MD  [facility] Cardiologist:  [name] MD [place] Electrophysiologist:  None   CC: Post ED f/u  History of Present Illness:    [name] is a 65 y.o. 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  In interim had ziopatch with no concerning findings.  Last seen 04/13/20.  In interim of this visit, patient CCTA and Echo- notable from improved LAP and aortic atherosclerosis, and a hiatal hernia.  During that visit started new lasix dose.  Seen 12/17/20.  Patient called in 01/31/21 for worsening SOB, PND, and orthopnea.  Added to DOD 02/03/21.  Diuretic increased.  Was improving but had 12/2 eval for possibly having a stroke (w/u negative).  She has lost 4 lbs with higher diuretics.  Patient notes that she was having a TIA. She was told this was related to the fluid pills.    With diuretics she has felt less bloated.  Still can't sleep with her head flat. Notes drooling.     Past Medical History:  Diagnosis Date   Adrenal mass ([place]) 03/226   Benign   Arthritis    knees/multiple orthopedic conditons; lower back   Asthma    per pt   Clotting disorder (HCC)    +beta-2-glycoprotein IgA antibody   COPD (chronic obstructive pulmonary disease) (HCC)    inhalers dependent on environment   Depression    Diabetes mellitus    120s usually fasting -  dx more than 10 yrs ago   Dizziness, nonspecific    DVT (deep venous thrombosis) (HCC)    Recurrent   Fibromyalgia    GERD (gastroesophageal reflux disease)    Heart murmur    History of blood transfusion    History of cocaine abuse ([place])    Remote history    Hyperlipidemia    Hypertension    takes meds daily   Hypothyroidism    Lupus  Anticoagulant Positive    On home oxygen therapy    at night   Shortness of breath    exertion or lying flat   Sleep apnea    2l of oxygen at night (as of 12/6, she used to)    Past Surgical History:  Procedure Laterality Date   ABDOMINAL HYSTERECTOMY  1989   Fibroids   ANTERIOR LUMBAR FUSION  01/03/2012   Procedure: ANTERIOR LUMBAR FUSION 1 LEVEL;  Surgeon: Winfield Cunas, MD;  Location: [facility];  Service: Neurosurgery;  Laterality: N/A;  Lumbar Four-Five Anterior Lumbar Interbody Fusion with Instrumentation   BACK SURGERY     cervical spine---disk disease   CARPAL TUNNEL RELEASE     Bilateral   CESAREAN SECTION     CHOLECYSTECTOMY     CYSTOSCOPY/RETROGRADE/URETEROSCOPY/STONE EXTRACTION WITH BASKET Right 04/26/2014   Procedure: CYSTOSCOPY/ RIGHT RETROGRADE/ RIGHT URETEROSCOPY/URETERAL AND RENAL PELVIS BIOPSY;  Surgeon: Alexis Frock, MD;  Location: [facility];  Service: Urology;  Laterality: Right;   gallstones removed     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   bilateral   right elbow surgery     THYROID LOBECTOMY Right 02/27/2017   THYROID LOBECTOMY Right 02/27/2017   Procedure: RIGHT THYROID LOBECTOMY;  Surgeon: Erroll Luna, MD;  Location: [facility] [facility];  Service: General;  Laterality: Right;   TUBAL LIGATION      Current Medications: Current Meds  Medication Sig   ACCU-CHEK GUIDE test strip USE UP TO FOUR TIMES DAILY AS DIRECTED.   Accu-Chek Softclix Lancets lancets USE UP TO FOUR TIMES DAILY AS DIRECTED.   albuterol (PROVENTIL HFA;VENTOLIN HFA) 108 (90 BASE) MCG/ACT inhaler Inhale 2 puffs into the lungs every 4 (four) hours as needed for shortness of breath.   albuterol (PROVENTIL) (2.5 MG/3ML) 0.083% nebulizer solution USE 1 VIAL IN NEBULIZER EVERY 6 HOURS - and as needed   blood glucose meter kit and supplies KIT Dispense based on patient and insurance preference. Use up to four times daily as directed. (FOR ICD-9 250.00, 250.01).   brexpiprazole (REXULTI) 2 MG TABS tablet  Take 1 tablet by mouth daily.   budesonide-formoterol (SYMBICORT) 160-4.5 MCG/ACT inhaler Inhale 2 puffs into the lungs 2 (two) times daily.   canagliflozin (INVOKANA) 100 MG TABS tablet Take 1 tablet (100 mg total) by mouth daily before breakfast.   CARTIA XT 240 MG 24 hr capsule Take 1 capsule (240 mg total) by mouth daily.   cloNIDine (CATAPRES) 0.1 MG tablet TAKE ONE TABLET BY MOUTH BEFORE BREAKFAST and TAKE ONE TABLET BY MOUTH EVERYDAY AT BEDTIME   diclofenac sodium (VOLTAREN) 1 % GEL Apply 2 g topically daily as needed (fibromyalgia pain).   DULoxetine (CYMBALTA) 20 MG capsule Take 20 mg by mouth daily.   DULoxetine (CYMBALTA) 60 MG capsule Take 60 mg by mouth at bedtime.    fluticasone (FLONASE) 50 MCG/ACT nasal spray Place 2 sprays into both nostrils 2 (two) times daily.   furosemide (LASIX) 80 MG tablet Take 1 tablet (80 mg total) by mouth 2 (two) times daily.   gabapentin (NEURONTIN) 300 MG capsule Take 1 capsule (300 mg total) by mouth 2 (two) times daily.   levocetirizine (XYZAL) 5 MG tablet Take 1 tablet (5 mg total) by mouth at bedtime.   LINZESS 145 MCG CAPS capsule Take 1 capsule (145 mcg total) by mouth every other day.   meclizine (ANTIVERT) 25 MG tablet Take 1 tablet (25 mg total) by mouth 3 (three) times daily as needed for dizziness.   metFORMIN (GLUCOPHAGE) 500 MG tablet Take 1 tablet (500 mg total) by mouth at bedtime.   metoCLOPramide (REGLAN) 10 MG tablet Take 1 tablet (10 mg total) by mouth every 6 (six) hours as needed for nausea (or headache).   omeprazole (PRILOSEC) 20 MG capsule Take 1 capsule (20 mg total) by mouth 2 (two) times daily.   potassium chloride (MICRO-K) 10 MEQ CR capsule TAKE TWO CAPSULES BY MOUTH EVERYDAY AT BEDTIME   pravastatin (PRAVACHOL) 40 MG tablet TAKE 1 TABLET(40 MG) BY MOUTH EVERY EVENING   spironolactone (ALDACTONE) 25 MG tablet Take 0.5 tablets (12.5 mg total) by mouth daily.   umeclidinium-vilanterol (ANORO ELLIPTA) 62.5-25 MCG/INH AEPB  Inhale into the lungs.   Vitamin D, Ergocalciferol, (DRISDOL) 1.25 MG (50000 UNIT) CAPS capsule Take 1 capsule (50,000 Units total) by mouth every 7 (seven) days.   warfarin (COUMADIN) 10 MG tablet TAKE 1 TABLET BY MOUTH ONCE A DAY ON MONDAYS, WEDNESDAYS, AND FRIDAYS   warfarin (COUMADIN) 5 MG tablet TAKE ONE TABLET BY MOUTH daily EXCEPT MONDAY, WEDNESDAY, AND friday OR AS DIRECTED   zonisamide (ZONEGRAN) 50 MG capsule 1 TABLET AM AND 2 TABLETS PM   [DISCONTINUED] COVID-19 mRNA bivalent vaccine, Pfizer, (PFIZER COVID-19 VAC BIVALENT) injection Inject into the muscle.   [DISCONTINUED] hydrochlorothiazide (HYDRODIURIL) 12.5  MG tablet    [DISCONTINUED] pneumococcal 20-valent conjugate vaccine (PREVNAR 20) 0.5 ML injection Inject into the muscle.   Current Facility-Administered Medications for the 02/21/21 encounter (Office Visit) with Werner Lean, MD  Medication   sodium chloride flush (NS) 0.9 % injection 3 mL    Allergies:   Lisinopril and Corticosteroids   Social History   Socioeconomic History   Marital status: Divorced    Spouse name: Not on file   Number of children: Not on file   Years of education: Not on file   Highest education level: Not on file  Occupational History   Not on file  Tobacco Use   Smoking status: Never   Smokeless tobacco: Never  Vaping Use   Vaping Use: Never used  Substance and Sexual Activity   Alcohol use: No    Alcohol/week: 0.0 standard drinks    Comment: beer in past    Drug use: Yes    Types: Cocaine    Comment: quit 2003-rehab progam in 2005   Sexual activity: Never  Other Topics Concern   Not on file  Social History Narrative   Lives alone in apartment, drives   Social Determinants of Health   Financial Resource Strain: Low Risk    Difficulty of Paying Living Expenses: Not hard at all  Food Insecurity: No Food Insecurity   Worried About Charity fundraiser in the Last Year: Never true   [place] in the Last Year:  Never true  Transportation Needs: No Transportation Needs   Lack of Transportation (Medical): No   Lack of Transportation (Non-Medical): No  Physical Activity: Inactive   Days of Exercise per Week: 0 days   Minutes of Exercise per Session: 0 min  Stress: No Stress Concern Present   Feeling of Stress : Not at all  Social Connections: Moderately Isolated   Frequency of Communication with Friends and Family: More than three times a week   Frequency of Social Gatherings with Friends and Family: More than three times a week   Attends Religious Services: More than 4 times per year   Active Member of [employer] or Organizations: No   Attends Music therapist: Never   Marital Status: Divorced    Family History: The patient's family history includes COPD in an other family member; Cancer in her mother; Diabetes in her mother; Heart failure in her father; Hyperlipidemia in an other family member; Hypertension in her father and mother. History of coronary artery disease notable for no members. History of heart failure notable for both parents. History of arrhythmia notable for no members. Denies family history of sudden cardiac death including drowning, car accidents, or unexplained deaths in the family. Mother->heart murmur  ROS:   Please see the history of present illness.    All other systems reviewed and are negative.  EKGs/Labs/Other Studies Reviewed:    The following studies were reviewed today:  EKG:  02/03/21: SR rate 80 LVH 04/13/20: SR 92 LVH 02/02/20:  SR Rate 85 LVH with secondary repolarization  Transthoracic Echocardiogram Date:05/04/20  Results:   IMPRESSIONS   1. Left ventricular ejection fraction, by estimation, is 60 to 65%. The  left ventricle has normal function. The left ventricle has no regional  wall motion abnormalities. Left ventricular diastolic parameters are  consistent with Grade I diastolic  dysfunction (impaired relaxation).   2. Right  ventricular systolic function is normal. The right ventricular  size is normal.   3. The mitral  valve is normal in structure. No evidence of mitral valve  regurgitation. No evidence of mitral stenosis.   4. The aortic valve is normal in structure. Aortic valve regurgitation is  not visualized.    Cardiac Monitor: Date: 03/25/20 Results:  Patient had a minimum heart rate of 67 bpm, maximum heart rate of 164 bpm, and average heart rate of 88 bpm. Predominant underlying rhythm was sinus rhythm. One run of ventricular tachycardia occurred lasting 5 beats at longest with a max rate of 164 bpm at fastest. Thirteen runs of supraventricular tachycardia occurred lasting 14 beats at longest with a max rate of 162 bpm at fastest. Isolated PACs were rare (<1.0%), with rare couplets and triplets present. Isolated PVCs were rare (<1.0%), with rare couplets and triplets present; bigeminy and trigeminy present. No evidence of complete heart block . Triggered and diary events associated overwhelming with sinus rhythm or sinus tachycardia; once with PVC.   No malignant arrhythmias.   CardiacCT : Date:05/07/20 Results: IMPRESSION: 1. Coronary calcium score of 0. This was 1st percentile for age, sex, and race matched control. 2. Normal coronary origin with right dominance. 3. CAD-RADS 0. No evidence of CAD (0%). Consider non-atherosclerotic causes of discomfort.   IMPRESSION: 1. No acute findings in the imaged extracardiac chest. 2. Hepatic steatosis. 3. Small hiatal hernia.  Recent Labs: 06/07/2020: TSH 4.23 02/02/2021: ALT 21 02/07/2021: Magnesium 1.8; NT-Pro BNP 13 02/18/2021: BUN 19; Creatinine, Ser 0.86; Hemoglobin 12.6; Platelets 348; Potassium 3.9; Sodium 139  Recent Lipid Panel    Component Value Date/Time   CHOL 194 02/02/2020 1126   TRIG 196 (H) 02/02/2020 1126   HDL 76 02/02/2020 1126   CHOLHDL 2.6 02/02/2020 1126   VLDL 20 04/24/2014 2245   LDLCALC 89 02/02/2020 1126     Physical Exam:    VS:  BP (!) 150/78   Pulse 86   Ht 5' 6" (1.676 m)   Wt 288 lb (130.6 kg)   SpO2 98%   BMI 46.48 kg/m     Wt Readings from Last 3 Encounters:  02/21/21 288 lb (130.6 kg)  02/19/21 293 lb (132.9 kg)  02/07/21 293 lb (132.9 kg)    Gen: No distress, Morbid obesity Neck: No JVD at 90,  thick neck Cardiac: No Rubs or Gallops, II/VI systolic murmur, regular rate and  radial pulses Respiratory: Clear to auscultation bilaterally, normal respiratory  GI: Soft, nontender, distended but improved from prior MS: Non-pitting edema;  moves all extremities Integument: Skin feels warm Neuro:  At time of evaluation, alert and oriented to person/place/time/situation  Psych: Normal affect, patient feels well   ASSESSMENT:    1. Acute on chronic heart failure with preserved ejection fraction ([place])   2. Medication management     PLAN:    In order of problems listed above:  Chronic Heart Failure Preserved Ejection Fraction  In the setting of Morbid Obesity, COPD and OSA overlap DVT on chronic AC - NYHA class IV, Stage C, etiology in the setting of DM/HTN - RHC - Diuretic regimen:Continue Invokana, lasix to 80 mg PO BID - aldactone 12.5 mg PO Daily - stop HCTZ - BMP day of surgery  Morbid Obesity/DM/HTN OSA on CPAP - OSA and is on CPAP follow by OSH Sleep Medicine  HLD - given no significant aortic atherosclerosis on my review -  LDL Cise[place] < 100  Asx SVT on heart monitor - no AF; no symptomatic SVT - AV Nodal Therapy: continue diltiazem   WIll see in  January 2023    Medication Adjustments/Labs and Tests Ordered: Current medicines are reviewed at length with the patient today.  Concerns regarding medicines are outlined above.  Orders Placed This Encounter  Procedures   CBC   Basic metabolic panel   Protime-INR   Basic metabolic panel     Meds ordered this encounter  Medications   sodium chloride flush (NS) 0.9 % injection 3 mL       Patient Instructions  Medication Instructions:  Your physician has recommended you make the following change in your medication:  STOP: hydrochlorothiazide (HCTZ)  *If you need a refill on your cardiac medications before your next appointment, please call your pharmacy*   Lab Work: TODAY: CBC, BMP, PT/INR If you have labs (blood work) drawn today and your tests are completely normal, you will receive your results only by: [place] (if you have MyChart) OR A paper copy in the mail If you have any lab test that is abnormal or we need to change your treatment, we will call you to review the results.   Testing/Procedures: Your physician has requested that you have a cardiac catheterization. Cardiac catheterization is used to diagnose and/or treat various heart conditions. Doctors may recommend this procedure for a number of different reasons. The most common reason is to evaluate chest pain. Chest pain can be a symptom of coronary artery disease (CAD), and cardiac catheterization can show whether plaque is narrowing or blocking your heart's arteries. This procedure is also used to evaluate the valves, as well as measure the blood flow and oxygen levels in different parts of your heart.  Please follow instruction sheet, as given.    Follow-Up: At [facility], you and your health needs are our priority.  As part of our continuing mission to provide you with exceptional heart care, we have created designated Provider Care Teams.  These Care Teams include your primary Cardiologist (physician) and Advanced Practice Providers (APPs -  Physician Assistants and Nurse Practitioners) who all work together to provide you with the care you need, when you need it.   Your next appointment:   1 month(s)  The format for your next appointment:   In Person  Provider:   Werner Lean, MD {    Other Instructions  [place] OFFICE [place], [place] [address] Dept: [phone] Loc: [place]  02/21/2021  You are scheduled for a Cardiac Catheterization on Monday, December 12 with Dr. Lauree Chandler.  1. Please arrive at the [facility] (Main Entrance A) at [facility]: [address] [place], [place] [address] at 5:30 AM (This time is two hours before your procedure to ensure your preparation). Free valet parking service is available.   Special note: Every effort is made to have your procedure done on time. Please understand that emergencies sometimes delay scheduled procedures.  2. Diet: Do not eat solid foods after midnight.  The patient may have clear liquids until 5am upon the day of the procedure.  3. Labs: You will need to have blood drawn on Monday, December 5 at [facility] at [facility]. [address], [place]    4. Medication instructions in preparation for your procedure:   Contrast Allergy: No    Stop taking Coumadin (Warfarin) on Wednesday, December 7.  This is 5 days before your Heart Cath  DO NOT TAKE THE FOLLOWING  MEDICATIONS ON THE MORNING OF HEART CATH:  Invokana  Furosemide (Lasix)  Spironolactone     Do not take Diabetes Med Glucophage (Metformin) on the day of the procedure and HOLD 48 HOURS AFTER THE PROCEDURE.  On the morning of your procedure, take your Aspirin and any morning medicines NOT listed above.  You may use sips of water.  5. Plan for one night stay--bring personal belongings. 6. Bring a current list of your medications and current insurance cards. 7. You MUST have a responsible person to drive you home. 8. Someone MUST be with you the first 24 hours after you arrive home or your discharge will be delayed. 9. Please wear clothes that are easy to get on and off and wear slip-on shoes.  Thank you for allowing [place] to care for you!   -- [facility]  Invasive Cardiovascular services     Signed, Werner Lean, MD  02/21/2021 3:26 PM    [place]

## 2021-02-21 ENCOUNTER — Encounter: Payer: Self-pay | Admitting: Internal Medicine

## 2021-02-21 ENCOUNTER — Other Ambulatory Visit: Payer: Self-pay

## 2021-02-21 ENCOUNTER — Ambulatory Visit (INDEPENDENT_AMBULATORY_CARE_PROVIDER_SITE_OTHER): Payer: Medicare Other | Admitting: Internal Medicine

## 2021-02-21 VITALS — BP 150/78 | HR 86 | Ht 66.0 in | Wt 288.0 lb

## 2021-02-21 DIAGNOSIS — I5033 Acute on chronic diastolic (congestive) heart failure: Secondary | ICD-10-CM | POA: Diagnosis not present

## 2021-02-21 DIAGNOSIS — Z79899 Other long term (current) drug therapy: Secondary | ICD-10-CM

## 2021-02-21 LAB — CBC
Hematocrit: 36.6 % (ref 34.0–46.6)
Hemoglobin: 12 g/dL (ref 11.1–15.9)
MCH: 27.6 pg (ref 26.6–33.0)
MCHC: 32.8 g/dL (ref 31.5–35.7)
MCV: 84 fL (ref 79–97)
Platelets: 323 10*3/uL (ref 150–450)
RBC: 4.34 x10E6/uL (ref 3.77–5.28)
RDW: 16.3 % — ABNORMAL HIGH (ref 11.7–15.4)
WBC: 7.2 10*3/uL (ref 3.4–10.8)

## 2021-02-21 LAB — BASIC METABOLIC PANEL
BUN/Creatinine Ratio: 16 (ref 12–28)
BUN: 12 mg/dL (ref 8–27)
CO2: 24 mmol/L (ref 20–29)
Calcium: 9.4 mg/dL (ref 8.7–10.3)
Chloride: 103 mmol/L (ref 96–106)
Creatinine, Ser: 0.73 mg/dL (ref 0.57–1.00)
Glucose: 175 mg/dL — ABNORMAL HIGH (ref 70–99)
Potassium: 4.1 mmol/L (ref 3.5–5.2)
Sodium: 140 mmol/L (ref 134–144)
eGFR: 91 mL/min/{1.73_m2} (ref >59)

## 2021-02-21 LAB — PROTIME-INR
INR: 1 (ref 0.9–1.2)
Prothrombin Time: 10.2 s (ref 9.1–12.0)

## 2021-02-21 MED ORDER — SODIUM CHLORIDE 0.9% FLUSH: 3.0000 mL | Freq: Two times a day (BID) | INTRAVENOUS | Status: AC

## 2021-02-21 NOTE — Patient Instructions (Addendum)
Medication Instructions:  Your physician has recommended you make the following change in your medication:  STOP: hydrochlorothiazide (HCTZ)  *If you need a refill on your cardiac medications before your next appointment, please call your pharmacy*   Lab Work: TODAY: CBC, BMP, PT/INR If you have labs (blood work) drawn today and your tests are completely normal, you will receive your results only by: [place] (if you have MyChart) OR A paper copy in the mail If you have any lab test that is abnormal or we need to change your treatment, we will call you to review the results.   Testing/Procedures: Your physician has requested that you have a cardiac catheterization. Cardiac catheterization is used to diagnose and/or treat various heart conditions. Doctors may recommend this procedure for a number of different reasons. The most common reason is to evaluate chest pain. Chest pain can be a symptom of coronary artery disease (CAD), and cardiac catheterization can show whether plaque is narrowing or blocking your heart's arteries. This procedure is also used to evaluate the valves, as well as measure the blood flow and oxygen levels in different parts of your heart.  Please follow instruction sheet, as given.    Follow-Up: At [facility], you and your health needs are our priority.  As part of our continuing mission to provide you with exceptional heart care, we have created designated Provider Care Teams.  These Care Teams include your primary Cardiologist (physician) and Advanced Practice Providers (APPs -  Physician Assistants and Nurse Practitioners) who all work together to provide you with the care you need, when you need it.   Your next appointment:   1 month(s)  The format for your next appointment:   In Person  Provider:   Werner Lean, MD {    Other Instructions  [place]  OFFICE [place], [place] [address] Dept: [phone] Loc: [place]  02/21/2021  You are scheduled for a Cardiac Catheterization on Monday, December 12 with Dr. Lauree Chandler.  1. Please arrive at the [facility] (Main Entrance A) at [facility]: [address] [place],  [address] at 5:30 AM (This time is two hours before your procedure to ensure your preparation). Free valet parking service is available.   Special note: Every effort is made to have your procedure done on time. Please understand that emergencies sometimes delay scheduled procedures.  2. Diet: Do not eat solid foods after midnight.  The patient may have clear liquids until 5am upon the day of the procedure.  3. Labs: You will need to have blood drawn on Monday, December 5 at [facility] at [facility]. [address], [place]    4. Medication instructions in preparation for your procedure:   Contrast Allergy: No    Stop taking Coumadin (Warfarin) on Wednesday, December 7.  This is 5 days before your Heart Cath  DO NOT TAKE THE FOLLOWING MEDICATIONS ON THE MORNING OF HEART CATH:  Invokana  Furosemide (Lasix)  Spironolactone     Do not take Diabetes Med Glucophage (Metformin) on the day of the procedure and HOLD 48 HOURS AFTER THE PROCEDURE.  On the morning of your procedure, take your Aspirin and any morning medicines NOT listed above.  You may use sips of water.  5. Plan for one night stay--bring personal belongings. 6. Bring a current list of your medications and  current insurance cards. 7. You MUST have a responsible person to drive you home. 8. Someone MUST be with you the first 24 hours after you arrive home or your discharge will be delayed. 9. Please wear clothes that are easy to get on and off and wear slip-on shoes.  Thank you for allowing [place] to care for you!   -- [place] Invasive Cardiovascular services

## 2021-02-22 ENCOUNTER — Other Ambulatory Visit: Payer: Self-pay | Admitting: Family Medicine

## 2021-02-22 DIAGNOSIS — M797 Fibromyalgia: Secondary | ICD-10-CM

## 2021-02-23 ENCOUNTER — Telehealth: Payer: Self-pay

## 2021-02-23 ENCOUNTER — Other Ambulatory Visit: Payer: Self-pay | Admitting: Internal Medicine

## 2021-02-23 NOTE — Telephone Encounter (Signed)
Called pt advised of error on heart cath instructions.  I informed pt that metformin only has to be held the day of heart cath.  Not the day of and 2 days after.  I informed pt that contrast will not be used so holding medication is not needed.  Pt verbalizes understanding and no questions or concerns.

## 2021-02-24 ENCOUNTER — Telehealth: Payer: Self-pay | Admitting: *Deleted

## 2021-02-24 ENCOUNTER — Telehealth: Payer: Self-pay

## 2021-02-24 ENCOUNTER — Other Ambulatory Visit: Payer: Self-pay

## 2021-02-24 DIAGNOSIS — I82403 Acute embolism and thrombosis of unspecified deep veins of lower extremity, bilateral: Secondary | ICD-10-CM

## 2021-02-24 DIAGNOSIS — Z7901 Long term (current) use of anticoagulants: Secondary | ICD-10-CM

## 2021-02-24 MED ORDER — WARFARIN SODIUM 10 MG PO TABS: ORAL_TABLET | ORAL | 1 refills | Status: DC

## 2021-02-24 NOTE — Telephone Encounter (Signed)
Pt require for a refill on Coumadin 10 mg.  Prescription was order and sent to [facility]. Also pt verbalized understanding her new lab appointment is schedule on the 12/20 at 10:30 and her lab appointment on the 12/28 was cancel

## 2021-02-24 NOTE — Telephone Encounter (Signed)
Right Heart catheterization scheduled at [facility] for: Monday February 28, 2021 7:30 AM [facility] Main Entrance A [facility]) at: 5:30 AM   Diet-no solid food after midnight prior to cath, clear liquids until 5 AM day of procedure.  Medication instructions for procedure: -Hold:  Warfarin (coumadin) -none 02/24/21 until post procedure  Metformin/Invokana-AM of procedure  Spironolactone/Lasix/KCl-AM of procedure -Except hold medications usual morning medications can be taken pre-cath with sips of water.    Confirmed patient has responsible adult to drive home post procedure and be with patient first 24 hours after arriving home.  [name] does allow one visitor to accompany you and wait in the hospital waiting room while you are there for your procedure. You and your visitor will be asked to wear a mask once you enter the hospital.   Patient reports does not currently have any new symptoms concerning for COVID-19 and no household members with COVID-19 like illness.    Reviewed procedure/mask/visitor instructions with patient.       =

## 2021-02-24 NOTE — Telephone Encounter (Signed)
RN reached out to Merceda Elks, RN with Dr. Benay Spice office in regards to warfarin dosing d/t upcoming heart cath.  Was sent the following message from Manuela Schwartz:  "He said to resume warfarin at same dose the evening of procedure and recheck INR on 03/21/20. We will call her to set all this up. Thanks for reaching out to [place]."

## 2021-02-24 NOTE — Telephone Encounter (Signed)
Spoke with PT about her Cardiac Cath on 02-28-21.She was told to stop Coumadin 5 days prior of surgery, then start back on the evening of the  surgery. Pt stated she is taking 10 mg of Coumadin on Monday, Wednesday, Friday the other days she is taking 5 mg of Coumadin. Pt also stated she need a refill on 10 mg Coumadin. Pt verbalized understanding that she need to stop 5 days prior of surgery and to start back on the evening of the surgery. Scheduler will call the pt for her appointment to come in for lab.

## 2021-02-28 ENCOUNTER — Other Ambulatory Visit: Payer: Self-pay

## 2021-02-28 ENCOUNTER — Encounter (HOSPITAL_COMMUNITY)
Admission: RE | Disposition: A | Discharge status: Home / Self Care | Payer: Self-pay | Source: Ambulatory Visit | Attending: Cardiovascular Disease

## 2021-02-28 ENCOUNTER — Ambulatory Visit (HOSPITAL_COMMUNITY)
Admission: RE | Admit: 2021-02-28 | Discharge: 2021-02-28 | Disposition: A | Discharge status: Home / Self Care | Payer: Medicare Other | Source: Ambulatory Visit | Attending: Cardiovascular Disease | Admitting: Cardiovascular Disease

## 2021-02-28 DIAGNOSIS — J449 Chronic obstructive pulmonary disease, unspecified: Secondary | ICD-10-CM | POA: Diagnosis not present

## 2021-02-28 DIAGNOSIS — G4733 Obstructive sleep apnea (adult) (pediatric): Secondary | ICD-10-CM | POA: Insufficient documentation

## 2021-02-28 DIAGNOSIS — I5032 Chronic diastolic (congestive) heart failure: Secondary | ICD-10-CM | POA: Insufficient documentation

## 2021-02-28 DIAGNOSIS — Z6841 Body Mass Index (BMI) 40.0 and over, adult: Secondary | ICD-10-CM | POA: Diagnosis not present

## 2021-02-28 DIAGNOSIS — I5033 Acute on chronic diastolic (congestive) heart failure: Secondary | ICD-10-CM

## 2021-02-28 DIAGNOSIS — E785 Hyperlipidemia, unspecified: Secondary | ICD-10-CM | POA: Diagnosis not present

## 2021-02-28 DIAGNOSIS — I272 Pulmonary hypertension, unspecified: Secondary | ICD-10-CM | POA: Insufficient documentation

## 2021-02-28 DIAGNOSIS — I110 Hypertensive heart disease with heart failure: Secondary | ICD-10-CM | POA: Insufficient documentation

## 2021-02-28 DIAGNOSIS — E119 Type 2 diabetes mellitus without complications: Secondary | ICD-10-CM | POA: Diagnosis not present

## 2021-02-28 HISTORY — PX: RIGHT HEART CATH: CATH118263

## 2021-02-28 LAB — POCT I-STAT EG7
Acid-Base Excess: 0 mmol/L (ref 0.0–2.0)
Bicarbonate: 25.3 mmol/L (ref 20.0–28.0)
Calcium, Ion: 1.13 mmol/L — ABNORMAL LOW (ref 1.15–1.40)
HCT: 37 % (ref 36.0–46.0)
Hemoglobin: 12.6 g/dL (ref 12.0–15.0)
O2 Saturation: 74 %
Potassium: 3.5 mmol/L (ref 3.5–5.1)
Sodium: 142 mmol/L (ref 135–145)
TCO2: 27 mmol/L (ref 22–32)
pCO2, Ven: 44.3 mmHg (ref 44.0–60.0)
pH, Ven: 7.364 (ref 7.250–7.430)
pO2, Ven: 41 mmHg (ref 32.0–45.0)

## 2021-02-28 LAB — GLUCOSE, CAPILLARY: Glucose-Capillary: 177 mg/dL — ABNORMAL HIGH (ref 70–99)

## 2021-02-28 LAB — PROTIME-INR
INR: 0.9 (ref 0.8–1.2)
Prothrombin Time: 12.6 seconds (ref 11.4–15.2)

## 2021-02-28 SURGERY — RIGHT HEART CATH

## 2021-02-28 MED ORDER — ACETAMINOPHEN 325 MG PO TABS: 650.0000 mg | ORAL_TABLET | ORAL | Status: DC | PRN

## 2021-02-28 MED ORDER — FENTANYL CITRATE (PF) 100 MCG/2ML IJ SOLN
INTRAMUSCULAR | Status: DC | PRN
  Administered 2021-02-28: 25 ug via INTRAVENOUS

## 2021-02-28 MED ORDER — HEPARIN (PORCINE) IN NACL 1000-0.9 UT/500ML-% IV SOLN
INTRAVENOUS | Status: AC
  Filled 2021-02-28: qty 500

## 2021-02-28 MED ORDER — HYDRALAZINE HCL 20 MG/ML IJ SOLN: 10.0000 mg | INTRAMUSCULAR | Status: DC | PRN

## 2021-02-28 MED ORDER — SODIUM CHLORIDE 0.9 % IV SOLN: 250.0000 mL | INTRAVENOUS | Status: DC | PRN

## 2021-02-28 MED ORDER — HEPARIN (PORCINE) IN NACL 1000-0.9 UT/500ML-% IV SOLN
INTRAVENOUS | Status: DC | PRN
  Administered 2021-02-28: 500 mL

## 2021-02-28 MED ORDER — ASPIRIN 81 MG PO CHEW: 81.0000 mg | CHEWABLE_TABLET | ORAL | Status: DC

## 2021-02-28 MED ORDER — SODIUM CHLORIDE 0.9% FLUSH: 3.0000 mL | Freq: Two times a day (BID) | INTRAVENOUS | Status: DC

## 2021-02-28 MED ORDER — FENTANYL CITRATE (PF) 100 MCG/2ML IJ SOLN
INTRAMUSCULAR | Status: AC
  Filled 2021-02-28: qty 2

## 2021-02-28 MED ORDER — SODIUM CHLORIDE 0.9% FLUSH: 3.0000 mL | INTRAVENOUS | Status: DC | PRN

## 2021-02-28 MED ORDER — LIDOCAINE HCL (PF) 1 % IJ SOLN
INTRAMUSCULAR | Status: DC | PRN
  Administered 2021-02-28: 5 mL

## 2021-02-28 MED ORDER — MIDAZOLAM HCL 2 MG/2ML IJ SOLN
INTRAMUSCULAR | Status: AC
  Filled 2021-02-28: qty 2

## 2021-02-28 MED ORDER — LIDOCAINE HCL (PF) 1 % IJ SOLN
INTRAMUSCULAR | Status: AC
  Filled 2021-02-28: qty 30

## 2021-02-28 MED ORDER — ONDANSETRON HCL 4 MG/2ML IJ SOLN: 4.0000 mg | Freq: Four times a day (QID) | INTRAMUSCULAR | Status: DC | PRN

## 2021-02-28 MED ORDER — SODIUM CHLORIDE 0.9 % IV SOLN: INTRAVENOUS | Status: DC

## 2021-02-28 MED ORDER — MIDAZOLAM HCL 2 MG/2ML IJ SOLN
INTRAMUSCULAR | Status: DC | PRN
  Administered 2021-02-28: 1 mg via INTRAVENOUS

## 2021-02-28 MED ORDER — LABETALOL HCL 5 MG/ML IV SOLN: 10.0000 mg | INTRAVENOUS | Status: DC | PRN

## 2021-02-28 SURGICAL SUPPLY — 5 items
CATH BALLN WEDGE 5F 110CM (CATHETERS) ×1 IMPLANT
GUIDEWIRE .025 260CM (WIRE) ×1 IMPLANT
KIT HEART LEFT (KITS) ×1 IMPLANT
PACK CARDIAC CATHETERIZATION (CUSTOM PROCEDURE TRAY) ×1 IMPLANT
SHEATH GLIDE SLENDER 4/5FR (SHEATH) ×1 IMPLANT

## 2021-02-28 NOTE — Interval H&P Note (Signed)
History and Physical Interval Note:  02/28/2021 7:16 AM  [name]  has presented today for surgery, with the diagnosis of heart failure.  The various methods of treatment have been discussed with the patient and family. After consideration of risks, benefits and other options for treatment, the patient has consented to  Procedure(s): RIGHT HEART CATH (N/A) as a surgical intervention.  The patient's history has been reviewed, patient examined, no change in status, stable for surgery.  I have reviewed the patient's chart and labs.  Questions were answered to the patient's satisfaction.     Lauree Chandler

## 2021-03-01 ENCOUNTER — Ambulatory Visit: Payer: Medicare Other | Admitting: Podiatry

## 2021-03-01 ENCOUNTER — Encounter (HOSPITAL_COMMUNITY): Payer: Self-pay | Admitting: Cardiovascular Disease

## 2021-03-02 ENCOUNTER — Ambulatory Visit (INDEPENDENT_AMBULATORY_CARE_PROVIDER_SITE_OTHER): Payer: Medicare Other

## 2021-03-02 VITALS — Ht 60.0 in | Wt 288.0 lb

## 2021-03-02 DIAGNOSIS — Z0000 Encounter for general adult medical examination without abnormal findings: Secondary | ICD-10-CM

## 2021-03-02 NOTE — Patient Instructions (Addendum)
[name] , Thank you for taking time to come for your Medicare Wellness Visit. I appreciate your ongoing commitment to your health goals. Please review the following plan we discussed and let me know if I can assist you in the future.   These are the goals we discussed:  Goals      Exercise 3x per week (30 min per time)     Increase physical activity     Patient states would like to increase walking exercise.     Weight (lb) < 230 lb (104.3 kg)        This is a list of the screening recommended for you and due dates:  Health Maintenance  Topic Date Due   Complete foot exam   09/16/2019   Pap Smear  03/02/2021*   Zoster (Shingles) Vaccine (1 of 2) 05/05/2021*   Tetanus Vaccine  09/15/2021*   Pneumonia Vaccine (2 - PCV) 02/02/2022*   Hepatitis C Screening: USPSTF Recommendation to screen - Ages 18-79 yo.  03/02/2022*   Hemoglobin A1C  08/02/2021   Mammogram  09/30/2021   Eye exam for diabetics  11/24/2021   Colon Cancer Screening  04/20/2027   Flu Shot  Completed   DEXA scan (bone density measurement)  Completed   COVID-19 Vaccine  Completed   HIV Screening  Completed   HPV Vaccine  Aged Out  *Topic was postponed. The date shown is not the original due date.   Advanced directives: No  Conditions/risks identified: None  Next appointment: Follow up in one year for your annual wellness visit   Preventive Care 65 Years and Older, Female Preventive care refers to lifestyle choices and visits with your health care provider that can promote health and wellness. What does preventive care include? A yearly physical exam. This is also called an annual well check. Dental exams once or twice a year. Routine eye exams. Ask your health care provider how often you should have your eyes checked. Personal lifestyle choices, including: Daily care of your teeth and gums. Regular physical activity. Eating a healthy diet. Avoiding tobacco and drug use. Limiting alcohol use. Practicing  safe sex. Taking low-dose aspirin every day. Taking vitamin and mineral supplements as recommended by your health care provider. What happens during an annual well check? The services and screenings done by your health care provider during your annual well check will depend on your age, overall health, lifestyle risk factors, and family history of disease. Counseling  Your health care provider may ask you questions about your: Alcohol use. Tobacco use. Drug use. Emotional well-being. Home and relationship well-being. Sexual activity. Eating habits. History of falls. Memory and ability to understand (cognition). Work and work Statistician. Reproductive health. Screening  You may have the following tests or measurements: Height, weight, and BMI. Blood pressure. Lipid and cholesterol levels. These may be checked every 5 years, or more frequently if you are over 1 years old. Skin check. Lung cancer screening. You may have this screening every year starting at age 19 if you have a 30-pack-year history of smoking and currently smoke or have quit within the past 15 years. Fecal occult blood test (FOBT) of the stool. You may have this test every year starting at age 33. Flexible sigmoidoscopy or colonoscopy. You may have a sigmoidoscopy every 5 years or a colonoscopy every 10 years starting at age 82. Hepatitis C blood test. Hepatitis B blood test. Sexually transmitted disease (STD) testing. Diabetes screening. This is done by checking your blood  sugar (glucose) after you have not eaten for a while (fasting). You may have this done every 1-3 years. Bone density scan. This is done to screen for osteoporosis. You may have this done starting at age 45. Mammogram. This may be done every 1-2 years. Talk to your health care provider about how often you should have regular mammograms. Talk with your health care provider about your test results, treatment options, and if necessary, the need for more  tests. Vaccines  Your health care provider may recommend certain vaccines, such as: Influenza vaccine. This is recommended every year. Tetanus, diphtheria, and acellular pertussis (Tdap, Td) vaccine. You may need a Td booster every 10 years. Zoster vaccine. You may need this after age 16. Pneumococcal 13-valent conjugate (PCV13) vaccine. One dose is recommended after age 30. Pneumococcal polysaccharide (PPSV23) vaccine. One dose is recommended after age 57. Talk to your health care provider about which screenings and vaccines you need and how often you need them. This information is not intended to replace advice given to you by your health care provider. Make sure you discuss any questions you have with your health care provider. Document Released: 04/02/2015 Document Revised: 11/24/2015 Document Reviewed: 01/05/2015 Elsevier Interactive Patient Education  2017 [place] Prevention in the Home Falls can cause injuries. They can happen to people of all ages. There are many things you can do to make your home safe and to help prevent falls. What can I do on the outside of my home? Regularly fix the edges of walkways and driveways and fix any cracks. Remove anything that might make you trip as you walk through a door, such as a raised step or threshold. Trim any bushes or trees on the path to your home. Use bright outdoor lighting. Clear any walking paths of anything that might make someone trip, such as rocks or tools. Regularly check to see if handrails are loose or broken. Make sure that both sides of any steps have handrails. Any raised decks and porches should have guardrails on the edges. Have any leaves, snow, or ice cleared regularly. Use sand or salt on walking paths during winter. Clean up any spills in your garage right away. This includes oil or grease spills. What can I do in the bathroom? Use night lights. Install grab bars by the toilet and in the tub and shower.  Do not use towel bars as grab bars. Use non-skid mats or decals in the tub or shower. If you need to sit down in the shower, use a plastic, non-slip stool. Keep the floor dry. Clean up any water that spills on the floor as soon as it happens. Remove soap buildup in the tub or shower regularly. Attach bath mats securely with double-sided non-slip rug tape. Do not have throw rugs and other things on the floor that can make you trip. What can I do in the bedroom? Use night lights. Make sure that you have a light by your bed that is easy to reach. Do not use any sheets or blankets that are too big for your bed. They should not hang down onto the floor. Have a firm chair that has side arms. You can use this for support while you get dressed. Do not have throw rugs and other things on the floor that can make you trip. What can I do in the kitchen? Clean up any spills right away. Avoid walking on wet floors. Keep items that you use a lot in easy-to-reach  places. If you need to reach something above you, use a strong step stool that has a grab bar. Keep electrical cords out of the way. Do not use floor polish or wax that makes floors slippery. If you must use wax, use non-skid floor wax. Do not have throw rugs and other things on the floor that can make you trip. What can I do with my stairs? Do not leave any items on the stairs. Make sure that there are handrails on both sides of the stairs and use them. Fix handrails that are broken or loose. Make sure that handrails are as long as the stairways. Check any carpeting to make sure that it is firmly attached to the stairs. Fix any carpet that is loose or worn. Avoid having throw rugs at the top or bottom of the stairs. If you do have throw rugs, attach them to the floor with carpet tape. Make sure that you have a light switch at the top of the stairs and the bottom of the stairs. If you do not have them, ask someone to add them for you. What else  can I do to help prevent falls? Wear shoes that: Do not have high heels. Have rubber bottoms. Are comfortable and fit you well. Are closed at the toe. Do not wear sandals. If you use a stepladder: Make sure that it is fully opened. Do not climb a closed stepladder. Make sure that both sides of the stepladder are locked into place. Ask someone to hold it for you, if possible. Clearly mark and make sure that you can see: Any grab bars or handrails. First and last steps. Where the edge of each step is. Use tools that help you move around (mobility aids) if they are needed. These include: Canes. Walkers. Scooters. Crutches. Turn on the lights when you go into a dark area. Replace any light bulbs as soon as they burn out. Set up your furniture so you have a clear path. Avoid moving your furniture around. If any of your floors are uneven, fix them. If there are any pets around you, be aware of where they are. Review your medicines with your doctor. Some medicines can make you feel dizzy. This can increase your chance of falling. Ask your doctor what other things that you can do to help prevent falls. This information is not intended to replace advice given to you by your health care provider. Make sure you discuss any questions you have with your health care provider. Document Released: 12/31/2008 Document Revised: 08/12/2015 Document Reviewed: 04/10/2014 Elsevier Interactive Patient Education  2017 [employer].

## 2021-03-02 NOTE — Progress Notes (Signed)
Subjective:   [name] is a 65 y.o. female who presents for Medicare Annual (Subsequent) preventive examination.  Review of Systems    No ROS Cardiac Risk Factors include: advanced age (>4mn, >>67women);diabetes mellitus     Objective:    Today's Vitals   03/02/21 1514  Weight: 288 lb (130.6 kg)  Height: 5' (1.524 m)   Body mass index is 56.25 kg/m.  Advanced Directives 03/02/2021 02/28/2021 02/19/2021 11/03/2020 09/28/2020 05/14/2020 04/07/2020  Does Patient Have a Medical Advance Directive? No No No No No No No  Would patient like information on creating a medical advance directive? No - Patient declined No - Patient declined No - Patient declined Yes (MAU/Ambulatory/Procedural Areas - Information given) No - Patient declined No - Patient declined No - Patient declined  Pre-existing out of facility DNR order (yellow form or pink MOST form) - - - - - - -    Current Medications (verified) Outpatient Encounter Medications as of 03/02/2021  Medication Sig   ACCU-CHEK GUIDE test strip USE UP TO FOUR TIMES DAILY AS DIRECTED.   Accu-Chek Softclix Lancets lancets USE UP TO FOUR TIMES DAILY AS DIRECTED.   albuterol (PROVENTIL HFA;VENTOLIN HFA) 108 (90 BASE) MCG/ACT inhaler Inhale 2 puffs into the lungs every 4 (four) hours as needed for shortness of breath.   albuterol (PROVENTIL) (2.5 MG/3ML) 0.083% nebulizer solution USE 1 VIAL IN NEBULIZER EVERY 6 HOURS - and as needed   blood glucose meter kit and supplies KIT Dispense based on patient and insurance preference. Use up to four times daily as directed. (FOR ICD-9 250.00, 250.01).   brexpiprazole (REXULTI) 2 MG TABS tablet Take 1 tablet by mouth daily.   budesonide-formoterol (SYMBICORT) 160-4.5 MCG/ACT inhaler Inhale 2 puffs into the lungs 2 (two) times daily.   canagliflozin (INVOKANA) 100 MG TABS tablet Take 1 tablet (100 mg total) by mouth daily before breakfast.   CARTIA XT 240 MG 24 hr capsule Take 1 capsule (240 mg  total) by mouth daily.   cloNIDine (CATAPRES) 0.1 MG tablet TAKE ONE TABLET BY MOUTH BEFORE BREAKFAST and TAKE ONE TABLET BY MOUTH EVERYDAY AT BEDTIME   diclofenac sodium (VOLTAREN) 1 % GEL Apply 2 g topically daily as needed (fibromyalgia pain).   DULoxetine (CYMBALTA) 20 MG capsule Take 20 mg by mouth daily.   DULoxetine (CYMBALTA) 60 MG capsule Take 60 mg by mouth at bedtime.    fluticasone (FLONASE) 50 MCG/ACT nasal spray Place 2 sprays into both nostrils 2 (two) times daily.   furosemide (LASIX) 80 MG tablet TAKE ONE TABLET BY MOUTH TWICE DAILY   gabapentin (NEURONTIN) 300 MG capsule TAKE 1 CAPSULE(300 MG) BY MOUTH TWICE DAILY   levocetirizine (XYZAL) 5 MG tablet Take 1 tablet (5 mg total) by mouth at bedtime.   LINZESS 145 MCG CAPS capsule Take 1 capsule (145 mcg total) by mouth every other day.   meclizine (ANTIVERT) 25 MG tablet Take 1 tablet (25 mg total) by mouth 3 (three) times daily as needed for dizziness.   metFORMIN (GLUCOPHAGE) 500 MG tablet Take 1 tablet (500 mg total) by mouth at bedtime.   metoCLOPramide (REGLAN) 10 MG tablet Take 1 tablet (10 mg total) by mouth every 6 (six) hours as needed for nausea (or headache).   omeprazole (PRILOSEC) 20 MG capsule Take 1 capsule (20 mg total) by mouth 2 (two) times daily.   potassium chloride (MICRO-K) 10 MEQ CR capsule TAKE TWO CAPSULES BY MOUTH EVERYDAY AT BEDTIME   pravastatin (  PRAVACHOL) 40 MG tablet TAKE 1 TABLET(40 MG) BY MOUTH EVERY EVENING   spironolactone (ALDACTONE) 25 MG tablet Take 0.5 tablets (12.5 mg total) by mouth daily.   umeclidinium-vilanterol (ANORO ELLIPTA) 62.5-25 MCG/INH AEPB Inhale into the lungs.   Vitamin D, Ergocalciferol, (DRISDOL) 1.25 MG (50000 UNIT) CAPS capsule Take 1 capsule (50,000 Units total) by mouth every 7 (seven) days.   warfarin (COUMADIN) 10 MG tablet TAKE 1 TABLET BY MOUTH ONCE A DAY ON MONDAYS, WEDNESDAYS, AND FRIDAYS   warfarin (COUMADIN) 5 MG tablet TAKE ONE TABLET BY MOUTH daily EXCEPT  MONDAY, WEDNESDAY, AND friday OR AS DIRECTED   zonisamide (ZONEGRAN) 50 MG capsule 1 TABLET AM AND 2 TABLETS PM   Facility-Administered Encounter Medications as of 03/02/2021  Medication   sodium chloride flush (NS) 0.9 % injection 3 mL    Allergies (verified) Lisinopril and Corticosteroids   History: Past Medical History:  Diagnosis Date   Adrenal mass ([place]) 03/226   Benign   Arthritis    knees/multiple orthopedic conditons; lower back   Asthma    per pt   Clotting disorder (HCC)    +beta-2-glycoprotein IgA antibody   COPD (chronic obstructive pulmonary disease) (HCC)    inhalers dependent on environment   Depression    Diabetes mellitus    120s usually fasting -  dx more than 10 yrs ago   Dizziness, nonspecific    DVT (deep venous thrombosis) (HCC)    Recurrent   Fibromyalgia    GERD (gastroesophageal reflux disease)    Heart murmur    History of blood transfusion    History of cocaine abuse ([place])    Remote history    Hyperlipidemia    Hypertension    takes meds daily   Hypothyroidism    Lupus Anticoagulant Positive    On home oxygen therapy    at night   Shortness of breath    exertion or lying flat   Sleep apnea    2l of oxygen at night (as of 12/6, she used to)   Past Surgical History:  Procedure Laterality Date   ABDOMINAL HYSTERECTOMY  1989   Fibroids   ANTERIOR LUMBAR FUSION  01/03/2012   Procedure: ANTERIOR LUMBAR FUSION 1 LEVEL;  Surgeon: Winfield Cunas, MD;  Location: [facility];  Service: Neurosurgery;  Laterality: N/A;  Lumbar Four-Five Anterior Lumbar Interbody Fusion with Instrumentation   BACK SURGERY     cervical spine---disk disease   CARPAL TUNNEL RELEASE     Bilateral   CESAREAN SECTION     CHOLECYSTECTOMY     CYSTOSCOPY/RETROGRADE/URETEROSCOPY/STONE EXTRACTION WITH BASKET Right 04/26/2014   Procedure: CYSTOSCOPY/ RIGHT RETROGRADE/ RIGHT URETEROSCOPY/URETERAL AND RENAL PELVIS BIOPSY;  Surgeon: Alexis Frock, MD;  Location: [facility];   Service: Urology;  Laterality: Right;   gallstones removed     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   bilateral   right elbow surgery     RIGHT HEART CATH N/A 02/28/2021   Procedure: RIGHT HEART CATH;  Surgeon: Burnell Blanks, MD;  Location: [facility];  Service: Cardiovascular;  Laterality: N/A;   THYROID LOBECTOMY Right 02/27/2017   THYROID LOBECTOMY Right 02/27/2017   Procedure: RIGHT THYROID LOBECTOMY;  Surgeon: Erroll Luna, MD;  Location: [facility];  Service: General;  Laterality: Right;   TUBAL LIGATION     Family History  Problem Relation Age of Onset   Hypertension Mother    Diabetes Mother    Cancer Mother  Pancreatic   Hypertension Father    Heart failure Father    Hyperlipidemia Other    COPD Other    Social History   Socioeconomic History   Marital status: Divorced    Spouse name: Not on file   Number of children: Not on file   Years of education: Not on file   Highest education level: Not on file  Occupational History   Not on file  Tobacco Use   Smoking status: Never   Smokeless tobacco: Never  Vaping Use   Vaping Use: Never used  Substance and Sexual Activity   Alcohol use: No    Alcohol/week: 0.0 standard drinks    Comment: beer in past    Drug use: Yes    Types: Cocaine    Comment: quit 2003-rehab progam in 2005   Sexual activity: Never  Other Topics Concern   Not on file  Social History Narrative   Lives alone in apartment, drives   Social Determinants of Health   Financial Resource Strain: Low Risk    Difficulty of Paying Living Expenses: Not hard at all  Food Insecurity: No Food Insecurity   Worried About Charity fundraiser in the Last Year: Never true   [place] in the Last Year: Never true  Transportation Needs: No Transportation Needs   Lack of Transportation (Medical): No   Lack of Transportation (Non-Medical): No  Physical Activity: Insufficiently Active   Days of Exercise per Week: 3 days   Minutes of  Exercise per Session: 10 min  Stress: No Stress Concern Present   Feeling of Stress : Only a little  Social Connections: Moderately Isolated   Frequency of Communication with Friends and Family: Three times a week   Frequency of Social Gatherings with Friends and Family: Three times a week   Attends Religious Services: Never   Active Member of Clubs or Organizations: Yes   Attends Archivist Meetings: Never   Marital Status: Divorced   Clinical Intake:  Pre-visit preparation completed: YesNutrition Risk Assessment:  Has the patient had any N/V/D within the last 2 months?  No  Does the patient have any non-healing wounds?  No  Has the patient had any unintentional weight loss or weight gain?  No   Diabetes:  Is the patient diabetic?  Yes  If diabetic, was a CBG obtained today?  No  Did the patient bring in their glucometer from home?  No  How often do you monitor your CBG's? daily.   Financial Strains and Diabetes Management:  Are you having any financial strains with the device, your supplies or your medication? No .  Does the patient want to be seen by Chronic Care Management for management of their diabetes?  No  Would the patient like to be referred to a Nutritionist or for Diabetic Management?  No  Followed by PCP  Diabetic Exams:  Diabetic Eye Exam: Completed Yes.  Diabetic Foot Exam: Completed Yes.      Diabetic? Yes  Activities of Daily Living In your present state of health, do you have any difficulty performing the following activities: 03/02/2021  Hearing? N  Vision? N  Comment wears glasses. Followed by Dr Zadie Rhine  Difficulty concentrating or making decisions? Y  Comment Family Assist  Walking or climbing stairs? N  Comment Uses walker  Dressing or bathing? Y  Comment Nurse aid assist  Doing errands, shopping? Y  Comment [place] and eating ? Darreld Mclean  Comment Nurse aid assist.  Using the Toilet? N  In the past six months, have  you accidently leaked urine? Y  Comment Wears pads  Do you have problems with loss of bowel control? N  Managing your Medications? Y  Comment Nurse aid assist  Managing your Finances? N  Housekeeping or managing your Housekeeping? Y  Comment Nurse aid assist  Some recent data might be hidden    Patient Care Team: Billie Ruddy, MD as PCP - General (Family Medicine) Werner Lean, MD as PCP - Cardiology (Cardiology) Viona Gilmore, [facility] as Pharmacist (Pharmacist)  Indicate any recent Medical Services you may have received from other than [facility] providers in the past year (date may be approximate).     Assessment:   This is a routine wellness examination for [facility].  Virtual Visit via Telephone Note  I connected with  [name] on 03/02/21 at  3:15 PM EST by telephone and verified that I am speaking with the correct person using two identifiers.  Location: Patient: Home Provider: Office Persons participating in the virtual visit: patient/Nurse Health Advisor   I discussed the limitations, risks, security and privacy concerns of performing an evaluation and management service by telephone and the availability of in person appointments. The patient expressed understanding and agreed to proceed.  Interactive audio and video telecommunications were attempted between this nurse and patient, however failed, due to patient having technical difficulties OR patient did not have access to video capability.  We continued and completed visit with audio only.  Some vital signs may be absent or patient reported.   Criselda Peaches, LPN   Hearing/Vision screen Hearing Screening - Comments:: No difficulty hearing Vision Screening - Comments:: Wears glasses. Followed by Dr Zadie Rhine  Dietary issues and exercise activities discussed: Current Exercise Habits: Home exercise routine, Type of exercise: stretching, Time (Minutes): 10, Frequency (Times/Week): 3, Weekly Exercise  (Minutes/Week): 30, Intensity: Mild   Goals Addressed             This Visit's Progress    Increase physical activity       Patient states would like to increase walking exercise.       Depression Screen PHQ 2/9 Scores 03/02/2021 11/03/2020 03/05/2020 04/23/2019  PHQ - 2 Score 2 0 0 2  PHQ- 9 Score 4 - 0 11    Fall Risk Fall Risk  03/02/2021 11/03/2020 03/05/2020 10/13/2013  Falls in the past year? 0 0 1 Yes  Number falls in past yr: 0 0 1 2 or more  Injury with Fall? 0 - 0 -  Risk for fall due to : - - History of fall(s);Impaired balance/gait History of fall(s);Impaired balance/gait;Impaired mobility;Medication side effect  Follow up - - Falls evaluation completed;Falls prevention discussed -    FALL RISK PREVENTION PERTAINING TO THE HOME:  Any stairs in or around the home? Yes  If so, are there any without handrails? No  Home free of loose throw rugs in walkways, pet beds, electrical cords, etc? Yes  Adequate lighting in your home to reduce risk of falls? Yes   ASSISTIVE DEVICES UTILIZED TO PREVENT FALLS:  Life alert? No  Use of a cane, walker or w/c? Yes  Grab bars in the bathroom? Yes  Shower chair or bench in shower? Yes  Elevated toilet seat or a handicapped toilet? Yes   TIMED UP AND GO:  Was the test performed? No . Audio Visit  Cognitive Function:     6CIT  Screen 03/02/2021 03/05/2020  What Year? 0 points 0 points  What month? 0 points 0 points  What time? 0 points 0 points  Count back from 20 - 0 points  Months in reverse 0 points 2 points  Repeat phrase 0 points 10 points  Total Score - 12    Immunizations Immunization History  Administered Date(s) Administered   Influenza Split 01/08/2012   Influenza,inj,Quad PF,6+ Mos 12/31/2018   Influenza-Unspecified 12/18/2020   PFIZER Comirnaty(Gray Top)Covid-19 Tri-Sucrose Vaccine 11/12/2020   PFIZER(Purple Top)SARS-COV-2 Vaccination 06/30/2019, 07/21/2019   Pfizer Covid-19 Vaccine Bivalent Booster  60yr & up 02/14/2021   Pneumococcal Polysaccharide-23 01/08/2012    Screening Tests Health Maintenance  Topic Date Due   FOOT EXAM  09/16/2019   PAP SMEAR-Modifier  03/02/2021 (Originally 06/04/1976)   Zoster Vaccines- Shingrix (1 of 2) 05/05/2021 (Originally [birth year]05/1974)   TETANUS/TDAP  09/15/2021 (Originally [birth year]05/1974)   Pneumonia Vaccine 65 Years old (2 - PCV) 02/02/2022 (Originally 01/07/2013)   Hepatitis C Screening  03/02/2022 (Originally 06/04/1973)   HEMOGLOBIN A1C  08/02/2021   MAMMOGRAM  09/30/2021   OPHTHALMOLOGY EXAM  11/24/2021   COLONOSCOPY (Pts 45-443yrInsurance coverage will need to be confirmed)  04/20/2027   INFLUENZA VACCINE  Completed   DEXA SCAN  Completed   COVID-19 Vaccine  Completed   HIV Screening  Completed   HPV VACCINES  Aged Out    Health Maintenance   Additional Screening:  Vision Screening: Recommended annual ophthalmology exams for early detection of glaucoma and other disorders of the eye. Is the patient up to date with their annual eye exam?  Yes  Who is the provider or what is the name of the office in which the patient attends annual eye exams? Followed by Dr RaZadie Rhine  Dental Screening: Recommended annual dental exams for proper oral hygiene  Community Resource Referral / Chronic Care Management:  CRR required this visit?  No   CCM required this visit?  No      Plan:     I have personally reviewed and noted the following in the patients chart:   Medical and social history Use of alcohol, tobacco or illicit drugs  Current medications and supplements including opioid prescriptions. Patient currently not taking opioids. Functional ability and status Nutritional status Physical activity Advanced directives List of other physicians Hospitalizations, surgeries, and ER visits in previous 12 months Vitals Screenings to include cognitive, depression, and falls Referrals and appointments  In addition, I have reviewed and  discussed with patient certain preventive protocols, quality metrics, and best practice recommendations. A written personalized care plan for preventive services as well as general preventive health recommendations were provided to patient.     BeCriselda PeachesLPN   [id number] Nurse Notes:  Patient has concerns of being excessively tired, would like a consultation for a prescribed Vitamin.

## 2021-03-07 ENCOUNTER — Telehealth: Payer: Self-pay

## 2021-03-07 ENCOUNTER — Telehealth (INDEPENDENT_AMBULATORY_CARE_PROVIDER_SITE_OTHER): Payer: Medicare Other | Admitting: Family Medicine

## 2021-03-07 ENCOUNTER — Telehealth: Payer: Self-pay | Admitting: Pulmonary Disease

## 2021-03-07 ENCOUNTER — Encounter: Payer: Self-pay | Admitting: Family Medicine

## 2021-03-07 DIAGNOSIS — G4733 Obstructive sleep apnea (adult) (pediatric): Secondary | ICD-10-CM | POA: Diagnosis not present

## 2021-03-07 DIAGNOSIS — G4700 Insomnia, unspecified: Secondary | ICD-10-CM | POA: Diagnosis not present

## 2021-03-07 DIAGNOSIS — R5383 Other fatigue: Secondary | ICD-10-CM

## 2021-03-07 NOTE — Telephone Encounter (Signed)
Called and informed pt that MD request pulmonology referral for SOB.  Pt reports previous visits with [facility].  Pt has a recall from 2021.  RN called [facility] spoke with scheduler request that scheduler call pt to set up appointment.  Scheduler attempted to schedule with RN, informed her to call pt to schedule.

## 2021-03-07 NOTE — Telephone Encounter (Signed)
-----   Message from Werner Lean, MD sent at 03/01/2021  9:26 AM EST ----- No significant increase in PWCP; we will send her to pulm for her chronic SOB.  Thanks, MAC  ----- Message ----- From: Burnell Blanks, MD Sent: 02/28/2021   8:18 AM EST To: Werner Lean, MD  [name], Her numbers are not bad. Gerald Stabs

## 2021-03-07 NOTE — Progress Notes (Signed)
Virtual Visit via Telephone Note  I connected with [name] on 03/07/21 at  4:00 PM EST by telephone and verified that I am speaking with the correct person using two identifiers.   I discussed the limitations, risks, security and privacy concerns of performing an evaluation and management service by telephone and the availability of in person appointments. I also discussed with the patient that there may be a patient responsible charge related to this service. The patient expressed understanding and agreed to proceed.  Location patient: home Location provider: work or home office Participants present for the call: patient, provider Patient did not have a visit in the prior 7 days to address this/these issue(s).   History of Present Illness: Pt feels like she is tired all the time and it is getting worse.  Has trouble falling asleep.  Unable to use CPAP as it makes her feel like she can't breathe.  Pt notes gasping at night when not wearing CPAP.  Patient states she is unable to sleep during the day.  Tried to get a sleep study appt but the place she contacted wanted her to have it done at the hospital in [place].  Pt contacted [facility]. and has an appt 06/01/21.  Pt tried [facility].  In the past pt was advised to f/u with her Psychiatrist at [facility] regarding medication for sleep and other med adjustments.  Last f/u with Psychiatry was August or September.  Pt states she was on something to help her sleep but they changed it to a pink pill.  Thinks the med was Clonidine.  States the pink pill is not helping.       Observations/Objective: Patient sounds cheerful and well on the phone. I do not appreciate any SOB. Speech and thought processing are grossly intact. Patient reported vitals:  Assessment and Plan: Insomnia, unspecified type  -chronic -pt encourage to f/u with Psychiatry regarding medication adjustments/starting new med for  sleep. -sleep hygiene encouraged - Plan: Ambulatory referral to Sleep Studies  Fatigue, unspecified type -Likely multifactorial 2/2 obesity, fibromyalgia, insomnia -f/u sleep study -exercise encouraged.  Consider chair exercises. - Plan: Ambulatory referral to Sleep Studies  Morbid obesity ([place])  -Lifestyle modification strongly encouraged - Plan: Ambulatory referral to Sleep Studies  OSA (obstructive sleep apnea) -Symptomatic with gasping at night and fatigue.. -Currently unable to wear CPAP 2/2 shortness of breath -lifestyle modifications -Plan: Ambulatory referral to sleep studies  Follow Up Instructions:  F/u prn   [id number] [id number] [id number] 11-20 [id number] 21-30 I did not refer this patient for an OV in the next 24 hours for this/these issue(s).  I discussed the assessment and treatment plan with the patient. The patient was provided an opportunity to ask questions and all were answered. The patient agreed with the plan and demonstrated an understanding of the instructions.   The patient was advised to call back or seek an in-person evaluation if the symptoms worsen or if the condition fails to improve as anticipated.  I provided 11 minutes of non-face-to-face time during this encounter.   Billie Ruddy, MD

## 2021-03-08 ENCOUNTER — Telehealth: Payer: Self-pay | Admitting: Oncology

## 2021-03-08 ENCOUNTER — Inpatient Hospital Stay: Payer: Medicare Other

## 2021-03-08 NOTE — Telephone Encounter (Signed)
Seems like encounter was open in error so closing encounter.  

## 2021-03-15 ENCOUNTER — Inpatient Hospital Stay: Payer: Medicare Other | Attending: Oncology

## 2021-03-15 ENCOUNTER — Other Ambulatory Visit: Payer: Self-pay

## 2021-03-15 ENCOUNTER — Telehealth: Payer: Self-pay

## 2021-03-15 DIAGNOSIS — Z86718 Personal history of other venous thrombosis and embolism: Secondary | ICD-10-CM | POA: Insufficient documentation

## 2021-03-15 DIAGNOSIS — I82403 Acute embolism and thrombosis of unspecified deep veins of lower extremity, bilateral: Secondary | ICD-10-CM

## 2021-03-15 DIAGNOSIS — Z7901 Long term (current) use of anticoagulants: Secondary | ICD-10-CM

## 2021-03-15 LAB — PROTIME-INR
INR: 1.5 — ABNORMAL HIGH (ref 0.8–1.2)
Prothrombin Time: 17.9 seconds — ABNORMAL HIGH (ref 11.4–15.2)

## 2021-03-15 NOTE — Telephone Encounter (Signed)
Spoke with Pt who stated she has not been feeling well lately and has been taking B 12 injections. Relayed this message to Dr Benay Spice who then changed instructions on how she takes her coumadin. Pt will take 5 mg Monday,Wednesday,Friday. And on Tues, Thurs, Sat, Sun. 10 mg And repeat labs in 3 weeks. Lab appointment made and confirmed. Pt verbalized understanding

## 2021-03-15 NOTE — Telephone Encounter (Signed)
-----   Message from Ladell Pier, MD sent at 03/15/2021  2:37 PM EST ----- Please call patient, is she taking Coumadin consistently, the PT/INR has been subtherapeutic for the past month, we will need to increase her dose if she is taking it as prescribed

## 2021-03-16 ENCOUNTER — Inpatient Hospital Stay: Payer: Medicare Other

## 2021-03-21 ENCOUNTER — Other Ambulatory Visit: Payer: Self-pay | Admitting: Family Medicine

## 2021-03-22 ENCOUNTER — Telehealth: Payer: Self-pay | Admitting: Pharmacist

## 2021-03-22 NOTE — Progress Notes (Signed)
Cardiology Office Note:    Date:  03/25/2021   ID:  [name], DOB [birth year], MRN [id number]  PCP:  Billie Ruddy, MD  [facility] Cardiologist:  [name] MD [place] Electrophysiologist:  None   CC: Post RHC  History of Present Illness:    [name] is a 66 y.o. 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  In interim had ziopatch with no concerning findings.  Last seen 04/13/20.  In interim of this visit, patient CCTA and Echo- notable from improved LAP and aortic atherosclerosis, and a hiatal hernia.  During that visit started new lasix dose.  Seen 12/17/20.  Patient called in 01/31/21 for worsening SOB, PND, and orthopnea.  Added to DOD 02/03/21.  Diuretic increased.  Was improving but had 12/2 eval for possibly having a stroke (w/u negative).  She has lost 4 lbs with higher diuretics.  RHC showed slight elevation in PCWP with mild increase Right sided pressures.  Seen 03/25/21.  Planned for follow up with [facility].  Patient notes that she is doing bad.  She feels tired all the time and her body aches.  Has fibromyalgia but it is getting worse.  She needs to see a pain specialist. Has lung doctor appointment on the 10th.  No chest pain or pressure .   No PND/Orthopnea- can't lay flat because her medications do not help putting her to sleep.  Notes that her weight is going up and down.  No leg swelling.  No palpitations or syncope.  BP is controlled when she takes her medication.    Past Medical History:  Diagnosis Date   Adrenal mass ([place]) 03/226   Benign   Arthritis    knees/multiple orthopedic conditons; lower back   Asthma    per pt   Clotting disorder (HCC)    +beta-2-glycoprotein IgA antibody   COPD (chronic obstructive pulmonary disease) (HCC)    inhalers dependent on environment    Depression    Diabetes mellitus    120s usually fasting -  dx more than 10 yrs ago   Dizziness, nonspecific    DVT (deep venous thrombosis) (HCC)    Recurrent   Fibromyalgia    GERD (gastroesophageal reflux disease)    Heart murmur    History of blood transfusion    History of cocaine abuse ([place])    Remote history    Hyperlipidemia    Hypertension    takes meds daily   Hypothyroidism    Lupus Anticoagulant Positive    On home oxygen therapy    at night   Shortness of breath    exertion or lying flat   Sleep apnea    2l of oxygen at night (as of 12/6, she used to)    Past Surgical History:  Procedure Laterality Date   ABDOMINAL HYSTERECTOMY  1989   Fibroids   ANTERIOR LUMBAR FUSION  01/03/2012   Procedure: ANTERIOR LUMBAR FUSION 1 LEVEL;  Surgeon: Winfield Cunas, MD;  Location: [facility];  Service: Neurosurgery;  Laterality: N/A;  Lumbar Four-Five Anterior Lumbar Interbody Fusion with Instrumentation   BACK SURGERY     cervical spine---disk disease   CARPAL TUNNEL RELEASE     Bilateral   CESAREAN SECTION     CHOLECYSTECTOMY     CYSTOSCOPY/RETROGRADE/URETEROSCOPY/STONE EXTRACTION WITH BASKET Right 04/26/2014  Procedure: CYSTOSCOPY/ RIGHT RETROGRADE/ RIGHT URETEROSCOPY/URETERAL AND RENAL PELVIS BIOPSY;  Surgeon: Alexis Frock, MD;  Location: [facility];  Service: Urology;  Laterality: Right;   gallstones removed     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   bilateral   right elbow surgery     RIGHT HEART CATH N/A 02/28/2021   Procedure: RIGHT HEART CATH;  Surgeon: Burnell Blanks, MD;  Location: [place] [facility];  Service: Cardiovascular;  Laterality: N/A;   THYROID LOBECTOMY Right 02/27/2017   THYROID LOBECTOMY Right 02/27/2017   Procedure: RIGHT THYROID LOBECTOMY;  Surgeon: Erroll Luna, MD;  Location: [place];  Service: General;  Laterality: Right;   TUBAL LIGATION      Current Medications: Current Meds  Medication Sig   ACCU-CHEK GUIDE test strip USE UP TO  FOUR TIMES DAILY AS DIRECTED.   Accu-Chek Softclix Lancets lancets USE UP TO FOUR TIMES DAILY AS DIRECTED.   albuterol (PROVENTIL HFA;VENTOLIN HFA) 108 (90 BASE) MCG/ACT inhaler Inhale 2 puffs into the lungs every 4 (four) hours as needed for shortness of breath.   albuterol (PROVENTIL) (2.5 MG/3ML) 0.083% nebulizer solution USE 1 VIAL IN NEBULIZER EVERY 6 HOURS - and as needed   blood glucose meter kit and supplies KIT Dispense based on patient and insurance preference. Use up to four times daily as directed. (FOR ICD-9 250.00, 250.01).   brexpiprazole (REXULTI) 2 MG TABS tablet Take 1 tablet by mouth daily.   budesonide-formoterol (SYMBICORT) 160-4.5 MCG/ACT inhaler Inhale 2 puffs into the lungs 2 (two) times daily.   canagliflozin (INVOKANA) 100 MG TABS tablet Take 1 tablet (100 mg total) by mouth daily before breakfast.   CARTIA XT 240 MG 24 hr capsule Take 1 capsule (240 mg total) by mouth daily.   cloNIDine (CATAPRES) 0.1 MG tablet TAKE ONE TABLET BY MOUTH BEFORE BREAKFAST and TAKE ONE TABLET BY MOUTH EVERYDAY AT BEDTIME   diclofenac sodium (VOLTAREN) 1 % GEL Apply 2 g topically daily as needed (fibromyalgia pain).   DULoxetine (CYMBALTA) 20 MG capsule Take 20 mg by mouth daily.   DULoxetine (CYMBALTA) 60 MG capsule Take 60 mg by mouth at bedtime.    fluticasone (FLONASE) 50 MCG/ACT nasal spray Place 2 sprays into both nostrils 2 (two) times daily.   furosemide (LASIX) 80 MG tablet TAKE ONE TABLET BY MOUTH TWICE DAILY   gabapentin (NEURONTIN) 300 MG capsule TAKE 1 CAPSULE(300 MG) BY MOUTH TWICE DAILY   levocetirizine (XYZAL) 5 MG tablet Take 1 tablet (5 mg total) by mouth at bedtime.   LINZESS 145 MCG CAPS capsule Take 1 capsule (145 mcg total) by mouth every other day.   meclizine (ANTIVERT) 25 MG tablet Take 1 tablet (25 mg total) by mouth 3 (three) times daily as needed for dizziness.   metFORMIN (GLUCOPHAGE) 500 MG tablet Take 1 tablet (500 mg total) by mouth at bedtime.    metoCLOPramide (REGLAN) 10 MG tablet Take 1 tablet (10 mg total) by mouth every 6 (six) hours as needed for nausea (or headache).   omeprazole (PRILOSEC) 20 MG capsule Take 1 capsule (20 mg total) by mouth 2 (two) times daily.   potassium chloride (MICRO-K) 10 MEQ CR capsule TAKE TWO CAPSULES BY MOUTH EVERYDAY AT BEDTIME   pravastatin (PRAVACHOL) 40 MG tablet TAKE 1 TABLET(40 MG) BY MOUTH EVERY EVENING   spironolactone (ALDACTONE) 25 MG tablet Take 0.5 tablets (12.5 mg total) by mouth daily.   umeclidinium-vilanterol (ANORO ELLIPTA) 62.5-25 MCG/INH AEPB Inhale into the lungs.   Vitamin D,  Ergocalciferol, (DRISDOL) 1.25 MG (50000 UNIT) CAPS capsule Take 1 capsule (50,000 Units total) by mouth every 7 (seven) days.   warfarin (COUMADIN) 10 MG tablet TAKE 1 TABLET BY MOUTH ONCE A DAY ON MONDAYS, WEDNESDAYS, AND FRIDAYS (Patient taking differently: daily at 6 (six) AM. TAKE 1 TAB DAILY EXCEPT ON MONDAYS, WEDNESDAYS, AND FRIDAYS)   warfarin (COUMADIN) 5 MG tablet TAKE ONE TABLET BY MOUTH daily EXCEPT MONDAY, WEDNESDAY, AND friday OR AS DIRECTED   zonisamide (ZONEGRAN) 50 MG capsule 1 TABLET AM AND 2 TABLETS PM   Current Facility-Administered Medications for the 03/25/21 encounter (Office Visit) with [name] A, MD  Medication   sodium chloride flush (NS) 0.9 % injection 3 mL    Allergies:   Lisinopril and Corticosteroids   Social History   Socioeconomic History   Marital status: Divorced    Spouse name: Not on file   Number of children: Not on file   Years of education: Not on file   Highest education level: Not on file  Occupational History   Not on file  Tobacco Use   Smoking status: Never   Smokeless tobacco: Never  Vaping Use   Vaping Use: Never used  Substance and Sexual Activity   Alcohol use: No    Alcohol/week: 0.0 standard drinks    Comment: beer in past    Drug use: Yes    Types: Cocaine    Comment: quit 2003-rehab progam in 2005   Sexual activity: Never  Other  Topics Concern   Not on file  Social History Narrative   Lives alone in apartment, drives   Social Determinants of Health   Financial Resource Strain: Low Risk    Difficulty of Paying Living Expenses: Not hard at all  Food Insecurity: No Food Insecurity   Worried About Charity fundraiser in the Last Year: Never true   [place] in the Last Year: Never true  Transportation Needs: No Transportation Needs   Lack of Transportation (Medical): No   Lack of Transportation (Non-Medical): No  Physical Activity: Insufficiently Active   Days of Exercise per Week: 3 days   Minutes of Exercise per Session: 10 min  Stress: No Stress Concern Present   Feeling of Stress : Only a little  Social Connections: Moderately Isolated   Frequency of Communication with Friends and Family: Three times a week   Frequency of Social Gatherings with Friends and Family: Three times a week   Attends Religious Services: Never   Active Member of Clubs or Organizations: Yes   Attends Archivist Meetings: Never   Marital Status: Divorced    Family History: The patient's family history includes COPD in an other family member; Cancer in her mother; Diabetes in her mother; Heart failure in her father; Hyperlipidemia in an other family member; Hypertension in her father and mother. History of coronary artery disease notable for no members. History of heart failure notable for both parents. History of arrhythmia notable for no members. Denies family history of sudden cardiac death including drowning, car accidents, or unexplained deaths in the family. Mother->heart murmur  ROS:   Please see the history of present illness.    All other systems reviewed and are negative.  EKGs/Labs/Other Studies Reviewed:    The following studies were reviewed today:  EKG:  02/03/21: SR rate 80 LVH 04/13/20: SR 92 LVH 02/02/20:  SR Rate 85 LVH with secondary repolarization  Transthoracic  Echocardiogram Date:05/04/20  Results:   IMPRESSIONS  1. Left ventricular ejection fraction, by estimation, is 60 to 65%. The  left ventricle has normal function. The left ventricle has no regional  wall motion abnormalities. Left ventricular diastolic parameters are  consistent with Grade I diastolic  dysfunction (impaired relaxation).   2. Right ventricular systolic function is normal. The right ventricular  size is normal.   3. The mitral valve is normal in structure. No evidence of mitral valve  regurgitation. No evidence of mitral stenosis.   4. The aortic valve is normal in structure. Aortic valve regurgitation is  not visualized.    Cardiac Monitor: Date: 03/25/20 Results:  Patient had a minimum heart rate of 67 bpm, maximum heart rate of 164 bpm, and average heart rate of 88 bpm. Predominant underlying rhythm was sinus rhythm. One run of ventricular tachycardia occurred lasting 5 beats at longest with a max rate of 164 bpm at fastest. Thirteen runs of supraventricular tachycardia occurred lasting 14 beats at longest with a max rate of 162 bpm at fastest. Isolated PACs were rare (<1.0%), with rare couplets and triplets present. Isolated PVCs were rare (<1.0%), with rare couplets and triplets present; bigeminy and trigeminy present. No evidence of complete heart block . Triggered and diary events associated overwhelming with sinus rhythm or sinus tachycardia; once with PVC.   No malignant arrhythmias.   CardiacCT : Date:05/07/20 Results: IMPRESSION: 1. Coronary calcium score of 0. This was 1st percentile for age, sex, and race matched control. 2. Normal coronary origin with right dominance. 3. CAD-RADS 0. No evidence of CAD (0%). Consider non-atherosclerotic causes of discomfort.   IMPRESSION: 1. No acute findings in the imaged extracardiac chest. 2. Hepatic steatosis. 3. Small hiatal hernia.  Recent Labs: 06/07/2020: TSH 4.23 02/02/2021: ALT 21 02/07/2021:  Magnesium 1.8; NT-Pro BNP 13 02/21/2021: BUN 12; Creatinine, Ser 0.73; Platelets 323 02/28/2021: Hemoglobin 12.6; Potassium 3.5; Sodium 142  Recent Lipid Panel    Component Value Date/Time   CHOL 194 02/02/2020 1126   TRIG 196 (H) 02/02/2020 1126   HDL 76 02/02/2020 1126   CHOLHDL 2.6 02/02/2020 1126   VLDL 20 04/24/2014 2245   LDLCALC 89 02/02/2020 1126    Physical Exam:    VS:  BP (!) 186/105    Pulse 82    Ht 5' 6"  (1.676 m)    Wt 132.9 kg    SpO2 96%    BMI 47.29 kg/m     Wt Readings from Last 3 Encounters:  03/25/21 132.9 kg  03/02/21 130.6 kg  02/28/21 129.3 kg    Gen: mild distress, Obese female   Neck: No JVD Cardiac: No Rubs or Gallops, no Murmur, regular rhythm  +2 radial pulses Respiratory: Clear to auscultation bilaterally, normal effort, normal  respiratory rate GI: Soft, nontender, non-distended  MS: No pitting edema;  moves all extremities Integument: Skin feels warm Neuro:  At time of evaluation, alert and oriented to person/place/time/situation  Psych: Normal affect, patient feels poorly and with total body pain   ASSESSMENT:    1. Obesity, diabetes, and hypertension syndrome ([place])   2. OSA (obstructive sleep apnea)   3. Chronic obstructive pulmonary disease, unspecified COPD type ([place])      PLAN:    In order of problems listed above:  Chronic Heart Failure Preserved Ejection Fraction  In the setting of Morbid Obesity, COPD and OSA overlap DVT on chronic AC - NYHA class IV, Stage C, euvolemic etiology in the setting of DM/HTN  - RHC - Diuretic regimen:Continue Invokana, lasix  to 80 mg PO Daily; if increase in Leg swelling, increase to lasix 80 mg PO BID and we will check labs to  - aldactone 12.5 mg PO Daily - had pulm follow as there is a non cardiac component to her disease  Morbid Obesity/DM/HTN OSA on CPAP - OSA and is on CPAP follow by OSH Sleep Medicine  HLD - no significant aortic atherosclerosis on my review -  LDL [place] <  100  Asx SVT on heart monitor - no AF; no symptomatic SVT - AV Nodal Therapy: continue diltiazem  We discussed at length the importance of taking her medications (she did not take an of her meds today).  She notes that since she has moved to using [facility] the colors of her medications have changed.  This is confusing to her and her medications has been challenging.  We have reviewed her medications by name as she previously only knew them by color.  At next visit if she is still unable to improve her SOB, given the above, we will attempt NM Stress Test  (lexiscan).  Will see 05/2021.   Medication Adjustments/Labs and Tests Ordered: Current medicines are reviewed at length with the patient today.  Concerns regarding medicines are outlined above.  No orders of the defined types were placed in this encounter.    No orders of the defined types were placed in this encounter.     Patient Instructions  Medication Instructions:  Your physician recommends that you continue on your current medications as directed. Please refer to the Current Medication list given to you today.  *If you need a refill on your cardiac medications before your next appointment, please call your pharmacy*   Lab Work: NONE If you have labs (blood work) drawn today and your tests are completely normal, you will receive your results only by: [place] (if you have MyChart) OR A paper copy in the mail If you have any lab test that is abnormal or we need to change your treatment, we will call you to review the results.   Testing/Procedures: NONE   Follow-Up: As scheduled At [facility], you and your health needs are our priority.  As part of our continuing mission to provide you with exceptional heart care, we have created designated Provider Care Teams.  These Care Teams include your primary Cardiologist (physician) and Advanced Practice Providers (APPs -  Physician Assistants and Nurse  Practitioners) who all work together to provide you with the care you need, when you need it.  1}      Signed, Werner Lean, MD  03/25/2021 11:17 AM    [place]

## 2021-03-22 NOTE — Chronic Care Management (AMB) (Signed)
Chronic Care Management Pharmacy Assistant   Name: [name]  MRN: [id number] DOB: 03-28-1955  Reason for Encounter: Medication Review / Medication Coordination Call   Conditions to be addressed/monitored: HTN   Recent office visits:  03/07/2021 [name] MD - Patient was seen for insomnia and additional issues. No medication changes. Follow up if symptoms worsen or fail to improve.  Recent consult visits:  02/21/2021 [name] MD (cardiology) - Patient was seen for acute on chronic heart failure with preserved ejection fraction and an additional issue. Discontinued Covid 19 vaccine, hydrochlorothiazide 12.5 mg and Pneumococcal vaccine. Follow [facility].  Hospital visits: Patient was seen at [facility] ED due to tiredness and sleep trouble. Discharged on 02/19/2021 after 18 hours at ED.  New?Medications Started at [facility] Discharge:?? No medication started  Medication Changes at Hospital Discharge: No medication changes  Medications Discontinued at Hospital Discharge: No medications stopped.  Medications that remain the same after Hospital Discharge:??  -All other medications will remain the same.      Medications: Outpatient Encounter Medications as of 03/22/2021  Medication Sig   ACCU-CHEK GUIDE test strip USE UP TO FOUR TIMES DAILY AS DIRECTED.   Accu-Chek Softclix Lancets lancets USE UP TO FOUR TIMES DAILY AS DIRECTED.   albuterol (PROVENTIL HFA;VENTOLIN HFA) 108 (90 BASE) MCG/ACT inhaler Inhale 2 puffs into the lungs every 4 (four) hours as needed for shortness of breath.   albuterol (PROVENTIL) (2.5 MG/3ML) 0.083% nebulizer solution USE 1 VIAL IN NEBULIZER EVERY 6 HOURS - and as needed   blood glucose meter kit and supplies KIT Dispense based on patient and insurance preference. Use up to four times daily as directed. (FOR ICD-9 250.00, 250.01).   brexpiprazole (REXULTI) 2 MG TABS tablet Take 1 tablet by mouth  daily.   budesonide-formoterol (SYMBICORT) 160-4.5 MCG/ACT inhaler Inhale 2 puffs into the lungs 2 (two) times daily.   canagliflozin (INVOKANA) 100 MG TABS tablet Take 1 tablet (100 mg total) by mouth daily before breakfast.   CARTIA XT 240 MG 24 hr capsule Take 1 capsule (240 mg total) by mouth daily.   cloNIDine (CATAPRES) 0.1 MG tablet TAKE ONE TABLET BY MOUTH BEFORE BREAKFAST and TAKE ONE TABLET BY MOUTH EVERYDAY AT BEDTIME   diclofenac sodium (VOLTAREN) 1 % GEL Apply 2 g topically daily as needed (fibromyalgia pain).   DULoxetine (CYMBALTA) 20 MG capsule Take 20 mg by mouth daily.   DULoxetine (CYMBALTA) 60 MG capsule Take 60 mg by mouth at bedtime.    fluticasone (FLONASE) 50 MCG/ACT nasal spray Place 2 sprays into both nostrils 2 (two) times daily.   furosemide (LASIX) 80 MG tablet TAKE ONE TABLET BY MOUTH TWICE DAILY   gabapentin (NEURONTIN) 300 MG capsule TAKE 1 CAPSULE(300 MG) BY MOUTH TWICE DAILY   levocetirizine (XYZAL) 5 MG tablet Take 1 tablet (5 mg total) by mouth at bedtime.   LINZESS 145 MCG CAPS capsule Take 1 capsule (145 mcg total) by mouth every other day.   meclizine (ANTIVERT) 25 MG tablet Take 1 tablet (25 mg total) by mouth 3 (three) times daily as needed for dizziness.   metFORMIN (GLUCOPHAGE) 500 MG tablet Take 1 tablet (500 mg total) by mouth at bedtime.   metoCLOPramide (REGLAN) 10 MG tablet Take 1 tablet (10 mg total) by mouth every 6 (six) hours as needed for nausea (or headache).   omeprazole (PRILOSEC) 20 MG capsule Take 1 capsule (20 mg total) by mouth 2 (two) times  daily.   potassium chloride (MICRO-K) 10 MEQ CR capsule TAKE TWO CAPSULES BY MOUTH EVERYDAY AT BEDTIME   pravastatin (PRAVACHOL) 40 MG tablet TAKE 1 TABLET(40 MG) BY MOUTH EVERY EVENING   spironolactone (ALDACTONE) 25 MG tablet Take 0.5 tablets (12.5 mg total) by mouth daily.   umeclidinium-vilanterol (ANORO ELLIPTA) 62.5-25 MCG/INH AEPB Inhale into the lungs.   Vitamin D, Ergocalciferol, (DRISDOL)  1.25 MG (50000 UNIT) CAPS capsule Take 1 capsule (50,000 Units total) by mouth every 7 (seven) days.   warfarin (COUMADIN) 10 MG tablet TAKE 1 TABLET BY MOUTH ONCE A DAY ON MONDAYS, WEDNESDAYS, AND FRIDAYS (Patient taking differently: Days changed as of 03/15/21 TAKE 1 TABLET BY MOUTH ONCE A DAY ON Tuesday, Thursday,Saturday, Sunday.)   warfarin (COUMADIN) 5 MG tablet TAKE ONE TABLET BY MOUTH daily EXCEPT MONDAY, WEDNESDAY, AND friday OR AS DIRECTED (Patient taking differently: Days changed as of 03/15/21 TAKE ONE TABLET BY MOUTH daily Monday, WEDNESDAY, AND Friday. OR as directed)   zonisamide (ZONEGRAN) 50 MG capsule 1 TABLET AM AND 2 TABLETS PM   Facility-Administered Encounter Medications as of 03/22/2021  Medication   sodium chloride flush (NS) 0.9 % injection 3 mL   Reviewed chart for medication changes ahead of medication coordination call.  No OVs, Consults, or hospital visits since last care coordination call/Pharmacist visit. (If appropriate, list visit date, provider name)  No medication changes indicated OR if recent visit, treatment plan here.  BP Readings from Last 3 Encounters:  02/28/21 (!) 156/84  02/21/21 (!) 150/78  02/19/21 (!) 157/96    Lab Results  Component Value Date   HGBA1C 8.0 (H) 02/02/2021     Patient obtains medications through Adherence Packaging  30 Days    Last adherence delivery included: Rexulti 3 mg - take 1 tablet at bedtime Duloxetine 20 mg  - take 1 tablet before breakfast Duloxetine 60 mg - take 1 tablet at bedtime Clonidine 0.1 mg - take 1 tablet two times daily Warfarin 5 mg - TAKE 1 tablet daily except  Monday, Wednesday & Friday OR AS DIRECTED Cartia XT 240 mg - Take 1 capsule  daily Furosemide 20 mg - Take 1 tablet before breakfast Metformin 500 mg - take 1 tablet at bedtime Invokana 100 mg - take 1 tablet before breakfast Potassium CL ER 69mq take 2 capsules at bedtime Pravastatin 40 mg  - TAKE 1 TABLET EVERY EVENING Omeprazole - 20  mg - Take 1 capsule 2 times daily Linzess 145 mcg - Take one capsule every other day   Patient declined (meds) last month: None   Patient is due for next adherence delivery on: 04/01/2021   Called patient and reviewed medications and coordinated delivery.   This delivery to include: Rexulti 3 mg - take 1 tablet at bedtime Duloxetine 20 mg  - take 1 tablet before breakfast Duloxetine 60 mg - take 1 tablet at bedtime Clonidine 0.1 mg - take 1 tablet two times daily Warfarin 5 mg - TAKE 1 tablet daily except  Monday, Wednesday & Friday  Warfarin 10 mg - take 1 tablet Monday, Wednesday and Friday Cartia XT 240 mg - Take 1 capsule  daily Furosemide 80 mg - Take 1 tablet twice daily Metformin 500 mg - take 1 tablet at bedtime Invokana 100 mg - take 1 tablet before breakfast Potassium CL ER 161m take 2 capsules at bedtime Pravastatin 40 mg  - TAKE 1 TABLET EVERY EVENING Omeprazole - 20 mg - Take 1 capsule 2 times daily  Linzess 145 mcg - Take one capsule every other day Spironolactone 25 mg - take 1/2 tablet by mouth daily   Patient will need a short fill:No short fill needed   Coordinated acute fill:No acute fill needed.  Patient declined the following medications: No medications declined.   Confirmed delivery date of 04/01/2021 , advised patient that pharmacy will contact them the morning of delivery.  Care Gaps: AWV - scheduled 03/02/2021 Last BP - 156/96 on 02/07/2021 Last A1C - 8.0 on 02/02/2021 Hep C Screen - never done Papsmear - never done Foot exam - overdue  Star Rating Drugs: Invokana 100 mg - last filled 02/24/2021 30 DS at [facility] Pravachol 40 mg - last filled 02/24/2021 30 DS at [facility] Metformin 500 mg - last filled 02/24/2021 30 DS at [place] [phone]

## 2021-03-25 ENCOUNTER — Encounter: Payer: Self-pay | Admitting: Internal Medicine

## 2021-03-25 ENCOUNTER — Other Ambulatory Visit: Payer: Self-pay

## 2021-03-25 ENCOUNTER — Ambulatory Visit (INDEPENDENT_AMBULATORY_CARE_PROVIDER_SITE_OTHER): Payer: Commercial Managed Care - HMO | Admitting: Internal Medicine

## 2021-03-25 VITALS — BP 186/105 | HR 82 | Ht 66.0 in | Wt 293.0 lb

## 2021-03-25 DIAGNOSIS — E1169 Type 2 diabetes mellitus with other specified complication: Secondary | ICD-10-CM | POA: Diagnosis not present

## 2021-03-25 DIAGNOSIS — E669 Obesity, unspecified: Secondary | ICD-10-CM | POA: Diagnosis not present

## 2021-03-25 DIAGNOSIS — J449 Chronic obstructive pulmonary disease, unspecified: Secondary | ICD-10-CM | POA: Diagnosis not present

## 2021-03-25 DIAGNOSIS — I152 Hypertension secondary to endocrine disorders: Secondary | ICD-10-CM

## 2021-03-25 DIAGNOSIS — G4733 Obstructive sleep apnea (adult) (pediatric): Secondary | ICD-10-CM

## 2021-03-25 DIAGNOSIS — E1159 Type 2 diabetes mellitus with other circulatory complications: Secondary | ICD-10-CM

## 2021-03-25 NOTE — Patient Instructions (Signed)
Medication Instructions:  Your physician recommends that you continue on your current medications as directed. Please refer to the Current Medication list given to you today.  *If you need a refill on your cardiac medications before your next appointment, please call your pharmacy*   Lab Work: NONE If you have labs (blood work) drawn today and your tests are completely normal, you will receive your results only by: [place] (if you have MyChart) OR A paper copy in the mail If you have any lab test that is abnormal or we need to change your treatment, we will call you to review the results.   Testing/Procedures: NONE   Follow-Up: As scheduled At [facility], you and your health needs are our priority.  As part of our continuing mission to provide you with exceptional heart care, we have created designated Provider Care Teams.  These Care Teams include your primary Cardiologist (physician) and Advanced Practice Providers (APPs -  Physician Assistants and Nurse Practitioners) who all work together to provide you with the care you need, when you need it.  1}

## 2021-03-29 ENCOUNTER — Encounter: Payer: Self-pay | Admitting: Pulmonary Disease

## 2021-03-29 ENCOUNTER — Ambulatory Visit (INDEPENDENT_AMBULATORY_CARE_PROVIDER_SITE_OTHER): Payer: Commercial Managed Care - HMO | Admitting: Pulmonary Disease

## 2021-03-29 ENCOUNTER — Other Ambulatory Visit: Payer: Self-pay

## 2021-03-29 VITALS — BP 126/80 | HR 60 | Temp 98.0°F | Ht 66.0 in | Wt 291.6 lb

## 2021-03-29 DIAGNOSIS — G4733 Obstructive sleep apnea (adult) (pediatric): Secondary | ICD-10-CM | POA: Diagnosis not present

## 2021-03-29 DIAGNOSIS — J4540 Moderate persistent asthma, uncomplicated: Secondary | ICD-10-CM

## 2021-03-29 NOTE — Progress Notes (Signed)
[name]    [id number]    April 13, 1955  Primary Care Physician:Banks, Langley Adie, MD  Referring Physician: Billie Ruddy, [address] [place],  [place] [address]  Chief complaint: Follow up for dyspnea, COPD, asthma, post COVID-37  HPI: 66 year old with history of clotting disorder and recurrent DVT on Coumadin, lupus, OSA on CPAP, right adrenal mass, diabetes, Asthma  Follows with  allergy at [facility]. She was hospitalized in December 2020 for COVID-19 infection, presumed COPD exacerbation treated with remdesivir. Chief complaint is chronic dyspnea on exertion.  Her symptoms have worsened after her hospitalization for COVID-19. Also has lower extremity edema, orthopnea.  Pets: No pets Occupation: Used to work in Teacher, adult education, [employer], [employer], Materials engineer.  Currently on disability Exposures: No known exposures.  No mold, hot tub, Jacuzzi Smoking history: Has not smoked cigarettes.  Used to smoke cocaine from 2003-2006 Travel history: Previously lived in [place] for 10 years in [place] for a year.  No significant recent travel Relevant family history: Strong family history of asthma with multiple family members.  Interim history: Continues on Symbicort inhaler which is helping with her breathing.  Denies any dyspnea, wheezing  Diagnosed with sleep apnea at [facility] in [place] in 2022.  Recently started on CPAP but is intolerant of the machine.  She is shifting her sleep follow-up to [facility] physicians  Outpatient Encounter Medications as of 03/29/2021  Medication Sig   ACCU-CHEK GUIDE test strip USE UP TO FOUR TIMES DAILY AS DIRECTED.   Accu-Chek Softclix Lancets lancets USE UP TO FOUR TIMES DAILY AS DIRECTED.   albuterol (PROVENTIL HFA;VENTOLIN HFA) 108 (90 BASE) MCG/ACT inhaler Inhale 2 puffs into the lungs every 4 (four) hours as needed for shortness of breath.   albuterol (PROVENTIL) (2.5 MG/3ML) 0.083%  nebulizer solution USE 1 VIAL IN NEBULIZER EVERY 6 HOURS - and as needed   blood glucose meter kit and supplies KIT Dispense based on patient and insurance preference. Use up to four times daily as directed. (FOR ICD-9 250.00, 250.01).   brexpiprazole (REXULTI) 2 MG TABS tablet Take 1 tablet by mouth daily.   budesonide-formoterol (SYMBICORT) 160-4.5 MCG/ACT inhaler Inhale 2 puffs into the lungs 2 (two) times daily.   canagliflozin (INVOKANA) 100 MG TABS tablet Take 1 tablet (100 mg total) by mouth daily before breakfast.   CARTIA XT 240 MG 24 hr capsule Take 1 capsule (240 mg total) by mouth daily.   cloNIDine (CATAPRES) 0.1 MG tablet TAKE ONE TABLET BY MOUTH BEFORE BREAKFAST and TAKE ONE TABLET BY MOUTH EVERYDAY AT BEDTIME   diclofenac sodium (VOLTAREN) 1 % GEL Apply 2 g topically daily as needed (fibromyalgia pain).   DULoxetine (CYMBALTA) 20 MG capsule Take 20 mg by mouth daily.   DULoxetine (CYMBALTA) 60 MG capsule Take 60 mg by mouth at bedtime.    fluticasone (FLONASE) 50 MCG/ACT nasal spray Place 2 sprays into both nostrils 2 (two) times daily.   furosemide (LASIX) 80 MG tablet TAKE ONE TABLET BY MOUTH TWICE DAILY   gabapentin (NEURONTIN) 300 MG capsule TAKE 1 CAPSULE(300 MG) BY MOUTH TWICE DAILY   levocetirizine (XYZAL) 5 MG tablet Take 1 tablet (5 mg total) by mouth at bedtime.   LINZESS 145 MCG CAPS capsule Take 1 capsule (145 mcg total) by mouth every other day.   meclizine (ANTIVERT) 25 MG tablet Take 1 tablet (25 mg total) by mouth 3 (three) times daily as needed  for dizziness.   metFORMIN (GLUCOPHAGE) 500 MG tablet Take 1 tablet (500 mg total) by mouth at bedtime.   metoCLOPramide (REGLAN) 10 MG tablet Take 1 tablet (10 mg total) by mouth every 6 (six) hours as needed for nausea (or headache).   omeprazole (PRILOSEC) 20 MG capsule Take 1 capsule (20 mg total) by mouth 2 (two) times daily.   potassium chloride (MICRO-K) 10 MEQ CR capsule TAKE TWO CAPSULES BY MOUTH EVERYDAY AT BEDTIME    pravastatin (PRAVACHOL) 40 MG tablet TAKE 1 TABLET(40 MG) BY MOUTH EVERY EVENING   spironolactone (ALDACTONE) 25 MG tablet Take 0.5 tablets (12.5 mg total) by mouth daily.   umeclidinium-vilanterol (ANORO ELLIPTA) 62.5-25 MCG/INH AEPB Inhale into the lungs.   Vitamin D, Ergocalciferol, (DRISDOL) 1.25 MG (50000 UNIT) CAPS capsule Take 1 capsule (50,000 Units total) by mouth every 7 (seven) days.   warfarin (COUMADIN) 10 MG tablet TAKE 1 TABLET BY MOUTH ONCE A DAY ON MONDAYS, WEDNESDAYS, AND FRIDAYS (Patient taking differently: daily at 6 (six) AM. TAKE 1 TAB DAILY EXCEPT ON MONDAYS, WEDNESDAYS, AND FRIDAYS)   warfarin (COUMADIN) 5 MG tablet TAKE ONE TABLET BY MOUTH daily EXCEPT MONDAY, WEDNESDAY, AND friday OR AS DIRECTED   zonisamide (ZONEGRAN) 50 MG capsule 1 TABLET AM AND 2 TABLETS PM   Facility-Administered Encounter Medications as of 03/29/2021  Medication   sodium chloride flush (NS) 0.9 % injection 3 mL   Physical Exam: Blood pressure 126/80, pulse 60, temperature 98 F (36.7 C), temperature source Oral, height 5' 6"  (1.676 m), weight 291 lb 9.6 oz (132.3 kg), SpO2 95 %. Gen:      No acute distress HEENT:  EOMI, sclera anicteric Neck:     No masses; no thyromegaly Lungs:    Clear to auscultation bilaterally; normal respiratory effort CV:         Regular rate and rhythm; no murmurs Abd:      + bowel sounds; soft, non-tender; no palpable masses, no distension Ext:    No edema; adequate peripheral perfusion Skin:      Warm and dry; no rash Neuro: alert and oriented x 3 Psych: normal mood and affect   Data Reviewed: Imaging: Chest x-ray 12/19/2018-no acute abnormalities. Chest x-ray 03/09/2019-cardiomegaly, bilateral interstitial opacities consistent with pulmonary edema. Chest x-ray 07/09/2019-no acute findings.  CT coronaries 05/07/2020-visualized lung show no infiltrate with clear lungs  PFTs: 08/12/2019 FVC 1.81 [64%], FEV1 1.68 [77%], F/F 93, TLC 3.21 [60%], DLCO 22.89  [107%] Moderate restriction, bronchodilator response  Labs: CBC 07/09/2019-WBC 6.8, eos 2%, absolute eosinophil count 136. IgE 07/09/2019-12 BNP 07/09/19- 18 Alpha-1 antitrypsin 07/09/2019-126, PI MM  Cardiac: Echocardiogram 02/20/2019-LVEF 88-91%, grade 1 diastolic dysfunction,  Assessment:  Asthma Dyspnea is likely multifactorial from asthma, diastolic heart failure.   PFTs reviewed with bronchodilator response.  There is restriction which is likely from body habitus.  CT coronaries from 2022 showed clear lungs with no evidence of interstitial lung disease Continue Symbicort inhaler.    OSA Intolerant of CPAP.  She has follow-up with sleep clinic at [facility]  Plan/Recommendations: - Symbicort 2 puffs twice daily  Marshell Garfinkel MD [facility] 03/29/2021, 10:48 AM  CC: Billie Ruddy, MD

## 2021-03-29 NOTE — Patient Instructions (Signed)
I am glad you are doing well with regard to your breathing Continue Symbicort inhaler Follow-up in 6 months

## 2021-04-06 ENCOUNTER — Other Ambulatory Visit: Payer: Self-pay

## 2021-04-06 ENCOUNTER — Inpatient Hospital Stay: Payer: Medicare Other | Attending: Oncology

## 2021-04-06 DIAGNOSIS — Z86718 Personal history of other venous thrombosis and embolism: Secondary | ICD-10-CM | POA: Diagnosis not present

## 2021-04-06 DIAGNOSIS — Z7901 Long term (current) use of anticoagulants: Secondary | ICD-10-CM

## 2021-04-06 LAB — PROTIME-INR
INR: 1.3 — ABNORMAL HIGH (ref 0.8–1.2)
Prothrombin Time: 15.7 s — ABNORMAL HIGH (ref 11.4–15.2)

## 2021-04-07 ENCOUNTER — Other Ambulatory Visit: Payer: Self-pay

## 2021-04-07 ENCOUNTER — Telehealth: Payer: Self-pay

## 2021-04-07 DIAGNOSIS — I82403 Acute embolism and thrombosis of unspecified deep veins of lower extremity, bilateral: Secondary | ICD-10-CM

## 2021-04-07 DIAGNOSIS — Z7901 Long term (current) use of anticoagulants: Secondary | ICD-10-CM

## 2021-04-07 MED ORDER — WARFARIN SODIUM 10 MG PO TABS: ORAL_TABLET | ORAL | 1 refills | Status: DC

## 2021-04-07 NOTE — Telephone Encounter (Signed)
Pt notified and confirmed she missed two days of taking coumadin. Pt states she is taking the 10 mg on Tues, Thurs, Sat, Sun. And 5 mg on Mon, WED, Fri. Informed Pt she will return for lab in 2 weeks. Pt verbalized understanding.

## 2021-04-07 NOTE — Telephone Encounter (Signed)
-----   Message from Ladell Pier, MD sent at 04/06/2021  3:12 PM EST ----- Please call patient, is she taking Coumadin consistently, please clarify dose she is taking

## 2021-04-08 ENCOUNTER — Telehealth: Payer: Self-pay | Admitting: Oncology

## 2021-04-12 ENCOUNTER — Other Ambulatory Visit: Payer: Self-pay

## 2021-04-12 ENCOUNTER — Other Ambulatory Visit: Payer: Self-pay | Admitting: Family Medicine

## 2021-04-12 DIAGNOSIS — Z7901 Long term (current) use of anticoagulants: Secondary | ICD-10-CM

## 2021-04-13 DIAGNOSIS — J449 Chronic obstructive pulmonary disease, unspecified: Secondary | ICD-10-CM | POA: Diagnosis not present

## 2021-04-15 ENCOUNTER — Inpatient Hospital Stay: Payer: Medicare Other

## 2021-04-18 ENCOUNTER — Encounter: Payer: Self-pay | Admitting: Family Medicine

## 2021-04-18 ENCOUNTER — Telehealth (INDEPENDENT_AMBULATORY_CARE_PROVIDER_SITE_OTHER): Payer: Medicare Other | Admitting: Family Medicine

## 2021-04-18 DIAGNOSIS — F341 Dysthymic disorder: Secondary | ICD-10-CM

## 2021-04-18 DIAGNOSIS — E669 Obesity, unspecified: Secondary | ICD-10-CM

## 2021-04-18 DIAGNOSIS — R5383 Other fatigue: Secondary | ICD-10-CM

## 2021-04-18 DIAGNOSIS — J4540 Moderate persistent asthma, uncomplicated: Secondary | ICD-10-CM | POA: Diagnosis not present

## 2021-04-18 DIAGNOSIS — G4733 Obstructive sleep apnea (adult) (pediatric): Secondary | ICD-10-CM | POA: Diagnosis not present

## 2021-04-18 DIAGNOSIS — E1169 Type 2 diabetes mellitus with other specified complication: Secondary | ICD-10-CM

## 2021-04-18 DIAGNOSIS — I5032 Chronic diastolic (congestive) heart failure: Secondary | ICD-10-CM | POA: Diagnosis not present

## 2021-04-18 DIAGNOSIS — R600 Localized edema: Secondary | ICD-10-CM | POA: Diagnosis not present

## 2021-04-18 DIAGNOSIS — Z7901 Long term (current) use of anticoagulants: Secondary | ICD-10-CM

## 2021-04-18 MED ORDER — BUDESONIDE-FORMOTEROL FUMARATE 160-4.5 MCG/ACT IN AERO: 2.0000 | INHALATION_SPRAY | Freq: Two times a day (BID) | RESPIRATORY_TRACT | 11 refills | Status: DC

## 2021-04-18 NOTE — Patient Instructions (Addendum)
Pt schedule for lab appointment on 04/26/2021 at 11:20 AM.  Cymbalta Cymbalta current medications including Cymbalta and Rexulti lifestyle modifications encouraged -Diabetic retinopathy screening stable stable continue current medications continue current medications -Continue follow-up with cardiology -Continue follow-up with cardiology, TED hose or compression socks -Obtain Doppler for continued telephone call telephone call

## 2021-04-18 NOTE — Progress Notes (Signed)
Virtual Visit via Telephone Note  I connected with [name] on 04/18/21 at  2:30 PM EST by telephone and verified that I am speaking with the correct person using two identifiers.   I discussed the limitations, risks, security and privacy concerns of performing an evaluation and management service by telephone and the availability of in person appointments. I also discussed with the patient that there may be a patient responsible charge related to this service. The patient expressed understanding and agreed to proceed.  Location patient: home Location provider: work or home office Participants present for the call: patient, provider Patient did not have a visit in the prior 7 days to address this/these issue(s).   History of Present Illness: Pt states she does not have any energy.  When pt tries to get up and do anything she feels more tired.  Pt states she is tired of the "way her body feels".  States it makes her depressed and at time she cries.  Pt states she is taking an OTC Nature made vitamin B12 but still feels tired.  She felt better when she had the vitamin B12 shots.  Denies SOB.  Pt states sleep is a little better as she is back on seroquel.   Has noticed that she started rocking at night.  States this started prior to being back on Seroquel.      Pt has an appt with sleep medicine tomorrow.  Still having issues with CPAP, unable to wear it at night.  Pt sleeps propped up.    L leg getting larger than her R leg.  States just noticed it.  Denies erythema.  States hurts when she presses on it on her shin and sides.  Taking Coumadin.  Wt 291 lbs on 03/29/21 at [facility] appt.   Observations/Objective: Patient sounds cheerful and well on the phone. I do not appreciate any SOB. Speech and thought processing are grossly intact. Patient reported vitals:    Assessment and Plan: Fatigue, unspecified type  -chronic issue.  Likely multifactorial deconditioning, obesity, asthma,  depression, CHF, inability to wear CPAP for OSA, insomnia -also consider anemia -Vitamin deficiencies have also cause issues in the past.  Pt to come to clinic on 2/7 for lab appointment. -Pt encouraged to continue to try walking/increasing movement - Plan: Vitamin D, 25-hydroxy, Vitamin B12, TSH, T4, Free, CBC with Differential/Platelet, Iron, TIBC, and ferritin panel  Edema of left lower leg -Discussed possible causes including dependent edema, CHF exacerbation, DVT though on Coumadin, lymphedema -Discussed supportive care including elevating feet, decreasing sodium -will obtain doppler u/s as per chart review, telephone note 04/07/21, pt missed 2 doses of coumadin.  INR 1.3 on 04/06/21.  [place] INR 2-3. -Given strict precautions - Plan: CBC with Differential/Platelet, CMP, Brain Natriuretic Peptide  Chronic heart failure with preserved ejection fraction (HCC) -stable -Echo 05/04/20 with EF 86-76% grade I diastolic dysfunction -Continue current medications -continue f/u with Cardiology  - Plan: CMP, Brain Natriuretic Peptide  Moderate persistent asthma without complication -stable continue f/u with Pulmonology  - Plan: budesonide-formoterol (SYMBICORT) 160-4.5 MCG/ACT inhaler  OSA (obstructive sleep apnea)  - Plan: CBC with Differential/Platelet  Diabetes mellitus type 2 in obese (HCC)  -Hemoglobin A1c 8.0% on 02/02/2021 -BMI 47.07kg/m2 -Continue current medications including metformin 500 mg nightly, Invokana 100 mg in a.m. -Continue Pravachol 40 mg nightly -Allergy to lisinopril -Lifestyle modifications encouraged -needs foot exam at next OFV -diabetic retinopathy screen advised. - Plan: Vitamin B12, Hemoglobin A1c  Persistent depressive disorder -  increased -Continue Rexulti, Duloxetine, seroquel -continue f/u with Psychiatry -Continue follow-up with psychiatry - Plan: TSH, T4, Free  Chronic anticoagulation -h/o b/l DVT -[facility] INR 2-3 -INR 1.3 on 04/06/21. -Coumadin 10  mg MWF, and 20 mg T, TH, Sa, Su -per chart review phone note 04/07/21,  pt missed 2 days of coumadin  Follow Up Instructions:  F/u in 3-4 wks in person at clinic via telephone call, sooner if needed  [id number] 5-10 [id number] 11-20 [id number] 21-30 I did not refer this patient for an OV in the next 24 hours for this/these issue(s).  I discussed the assessment and treatment plan with the patient. The patient was provided an opportunity to ask questions and all were answered. The patient agreed with the plan and demonstrated an understanding of the instructions.   The patient was advised to call back or seek an in-person evaluation if the symptoms worsen or if the condition fails to improve as anticipated.  I provided 22 minutes of non-face-to-face time during this encounter.   Billie Ruddy, MD

## 2021-04-20 ENCOUNTER — Telehealth (HOSPITAL_BASED_OUTPATIENT_CLINIC_OR_DEPARTMENT_OTHER): Payer: Self-pay | Admitting: *Deleted

## 2021-04-20 NOTE — Telephone Encounter (Signed)
Spoke with patient regarding ordered Lower venous doppler----offered patient Friday 04/22/21--states she could not come---appt scheduled Monday 04/25/21 at 11:00 am.  Patient voiced her understanding of appointment date,, time--arrival time and location.

## 2021-04-21 ENCOUNTER — Other Ambulatory Visit: Payer: Self-pay | Admitting: Family Medicine

## 2021-04-21 ENCOUNTER — Other Ambulatory Visit (INDEPENDENT_AMBULATORY_CARE_PROVIDER_SITE_OTHER): Payer: Medicare Other

## 2021-04-21 ENCOUNTER — Other Ambulatory Visit: Payer: Self-pay | Admitting: Oncology

## 2021-04-21 DIAGNOSIS — E1169 Type 2 diabetes mellitus with other specified complication: Secondary | ICD-10-CM | POA: Diagnosis not present

## 2021-04-21 DIAGNOSIS — R600 Localized edema: Secondary | ICD-10-CM | POA: Diagnosis not present

## 2021-04-21 DIAGNOSIS — R5383 Other fatigue: Secondary | ICD-10-CM

## 2021-04-21 DIAGNOSIS — I5032 Chronic diastolic (congestive) heart failure: Secondary | ICD-10-CM

## 2021-04-21 DIAGNOSIS — E669 Obesity, unspecified: Secondary | ICD-10-CM | POA: Diagnosis not present

## 2021-04-21 DIAGNOSIS — F341 Dysthymic disorder: Secondary | ICD-10-CM

## 2021-04-21 DIAGNOSIS — E876 Hypokalemia: Secondary | ICD-10-CM

## 2021-04-21 DIAGNOSIS — G4733 Obstructive sleep apnea (adult) (pediatric): Secondary | ICD-10-CM | POA: Diagnosis not present

## 2021-04-21 LAB — CBC WITH DIFFERENTIAL/PLATELET
Basophils Absolute: 0 10*3/uL (ref 0.0–0.1)
Basophils Relative: 0.2 % (ref 0.0–3.0)
Eosinophils Absolute: 0.1 10*3/uL (ref 0.0–0.7)
Eosinophils Relative: 1.8 % (ref 0.0–5.0)
HCT: 39.4 % (ref 36.0–46.0)
Hemoglobin: 12.7 g/dL (ref 12.0–15.0)
Lymphocytes Relative: 33.2 % (ref 12.0–46.0)
Lymphs Abs: 1.9 10*3/uL (ref 0.7–4.0)
MCHC: 32.3 g/dL (ref 30.0–36.0)
MCV: 83 fl (ref 78.0–100.0)
Monocytes Absolute: 0.4 10*3/uL (ref 0.1–1.0)
Monocytes Relative: 7.7 % (ref 3.0–12.0)
Neutro Abs: 3.2 10*3/uL (ref 1.4–7.7)
Neutrophils Relative %: 57.1 % (ref 43.0–77.0)
Platelets: 316 10*3/uL (ref 150.0–400.0)
RBC: 4.74 Mil/uL (ref 3.87–5.11)
RDW: 16.6 % — ABNORMAL HIGH (ref 11.5–15.5)
WBC: 5.7 10*3/uL (ref 4.0–10.5)

## 2021-04-21 LAB — TSH: TSH: 5.15 u[IU]/mL (ref 0.35–5.50)

## 2021-04-21 LAB — COMPREHENSIVE METABOLIC PANEL
ALT: 20 U/L (ref 0–35)
AST: 19 U/L (ref 0–37)
Albumin: 4.3 g/dL (ref 3.5–5.2)
Alkaline Phosphatase: 80 U/L (ref 39–117)
BUN: 17 mg/dL (ref 6–23)
CO2: 28 mEq/L (ref 19–32)
Calcium: 9.3 mg/dL (ref 8.4–10.5)
Chloride: 101 mEq/L (ref 96–112)
Creatinine, Ser: 0.87 mg/dL (ref 0.40–1.20)
GFR: 69.69 mL/min (ref >60.00)
Glucose, Bld: 164 mg/dL — ABNORMAL HIGH (ref 70–99)
Potassium: 3.1 mEq/L — ABNORMAL LOW (ref 3.5–5.1)
Sodium: 139 mEq/L (ref 135–145)
Total Bilirubin: 0.3 mg/dL (ref 0.2–1.2)
Total Protein: 8.5 g/dL — ABNORMAL HIGH (ref 6.0–8.3)

## 2021-04-21 LAB — BRAIN NATRIURETIC PEPTIDE: Pro B Natriuretic peptide (BNP): 25 pg/mL (ref 0.0–100.0)

## 2021-04-21 LAB — HEMOGLOBIN A1C: Hgb A1c MFr Bld: 8.4 % — ABNORMAL HIGH (ref 4.6–6.5)

## 2021-04-21 LAB — T4, FREE: Free T4: 0.62 ng/dL (ref 0.60–1.60)

## 2021-04-21 LAB — VITAMIN D 25 HYDROXY (VIT D DEFICIENCY, FRACTURES): VITD: 31.3 ng/mL (ref 30.00–100.00)

## 2021-04-21 LAB — VITAMIN B12: Vitamin B-12: 915 pg/mL — ABNORMAL HIGH (ref 211–911)

## 2021-04-22 ENCOUNTER — Other Ambulatory Visit: Payer: Commercial Managed Care - HMO

## 2021-04-22 ENCOUNTER — Telehealth: Payer: Self-pay | Admitting: Pharmacist

## 2021-04-22 LAB — IRON,TIBC AND FERRITIN PANEL
%SAT: 10 % (calc) — ABNORMAL LOW (ref 16–45)
Ferritin: 27 ng/mL (ref 16–288)
Iron: 31 ug/dL — ABNORMAL LOW (ref 45–160)
TIBC: 325 mcg/dL (calc) (ref 250–450)

## 2021-04-22 NOTE — Chronic Care Management (AMB) (Addendum)
Chronic Care Management Pharmacy Assistant   Name: [name]  MRN: [id number] DOB: December 12, 1955  Reason for Encounter: Medication Review / Medication Coordination Call   Conditions to be addressed/monitored: HTN   Recent office visits:  04/18/2021 [name] MD - Patient was seen for fatigue and additional issues. No medication changes. Follow up in 3-4 weeks.  Recent consult visits:  03/29/2021 Marshell Garfinkel MD (pulmonary) - Patient was seen for moderate persistent asthma without complication and additional issues. No medication changes. Follow up in 6 months.  03/25/2021 [name] MD (cardiology) - Patient was seen for Obesity, diabetes, and hypertension syndrome and additional issues. No follow up noted.   Hospital visits:  None  Medications: Outpatient Encounter Medications as of 04/22/2021  Medication Sig   ACCU-CHEK GUIDE test strip USE UP TO FOUR TIMES DAILY AS DIRECTED.   Accu-Chek Softclix Lancets lancets USE UP TO FOUR TIMES DAILY AS DIRECTED.   albuterol (PROVENTIL HFA;VENTOLIN HFA) 108 (90 BASE) MCG/ACT inhaler Inhale 2 puffs into the lungs every 4 (four) hours as needed for shortness of breath.   albuterol (PROVENTIL) (2.5 MG/3ML) 0.083% nebulizer solution USE 1 VIAL IN NEBULIZER EVERY 6 HOURS - and as needed   blood glucose meter kit and supplies KIT Dispense based on patient and insurance preference. Use up to four times daily as directed. (FOR ICD-9 250.00, 250.01).   brexpiprazole (REXULTI) 2 MG TABS tablet Take 1 tablet by mouth daily.   budesonide-formoterol (SYMBICORT) 160-4.5 MCG/ACT inhaler Inhale 2 puffs into the lungs 2 (two) times daily.   canagliflozin (INVOKANA) 100 MG TABS tablet Take 1 tablet (100 mg total) by mouth daily before breakfast.   CARTIA XT 240 MG 24 hr capsule Take 1 capsule (240 mg total) by mouth daily.   cloNIDine (CATAPRES) 0.1 MG tablet TAKE ONE TABLET BY MOUTH BEFORE BREAKFAST and TAKE ONE TABLET BY MOUTH  EVERYDAY AT BEDTIME   diclofenac sodium (VOLTAREN) 1 % GEL Apply 2 g topically daily as needed (fibromyalgia pain).   DULoxetine (CYMBALTA) 20 MG capsule Take 20 mg by mouth daily.   DULoxetine (CYMBALTA) 60 MG capsule Take 60 mg by mouth at bedtime.    fluticasone (FLONASE) 50 MCG/ACT nasal spray Place 2 sprays into both nostrils 2 (two) times daily.   furosemide (LASIX) 80 MG tablet TAKE ONE TABLET BY MOUTH TWICE DAILY   gabapentin (NEURONTIN) 300 MG capsule TAKE 1 CAPSULE(300 MG) BY MOUTH TWICE DAILY   levocetirizine (XYZAL) 5 MG tablet Take 1 tablet (5 mg total) by mouth at bedtime.   LINZESS 145 MCG CAPS capsule Take 1 capsule (145 mcg total) by mouth every other day.   meclizine (ANTIVERT) 25 MG tablet Take 1 tablet (25 mg total) by mouth 3 (three) times daily as needed for dizziness.   metFORMIN (GLUCOPHAGE) 500 MG tablet Take 1 tablet (500 mg total) by mouth at bedtime.   metoCLOPramide (REGLAN) 10 MG tablet Take 1 tablet (10 mg total) by mouth every 6 (six) hours as needed for nausea (or headache).   omeprazole (PRILOSEC) 20 MG capsule Take 1 capsule (20 mg total) by mouth 2 (two) times daily.   potassium chloride (MICRO-K) 10 MEQ CR capsule TAKE TWO CAPSULES BY MOUTH EVERYDAY AT BEDTIME   pravastatin (PRAVACHOL) 40 MG tablet TAKE ONE TABLET BY MOUTH EVERY EVENING   spironolactone (ALDACTONE) 25 MG tablet Take 0.5 tablets (12.5 mg total) by mouth daily.   umeclidinium-vilanterol (ANORO ELLIPTA) 62.5-25 MCG/INH AEPB Inhale into the lungs.  Vitamin D, Ergocalciferol, (DRISDOL) 1.25 MG (50000 UNIT) CAPS capsule Take 1 capsule (50,000 Units total) by mouth every 7 (seven) days.   warfarin (COUMADIN) 10 MG tablet TAKE 1 TABLET BY MOUTH ONCE A DAY ON MONDAYS, WEDNESDAYS, AND FRIDAYS and 2 tabs (47m) on Tues, Thurs, Sat, Sun.   warfarin (COUMADIN) 5 MG tablet TAKE ONE TABLET BY MOUTH ONCE daily EXCEPT ON Mondays, Wednesdays, AND Fridays OR as directed   zonisamide (ZONEGRAN) 50 MG capsule 1  TABLET AM AND 2 TABLETS PM   Facility-Administered Encounter Medications as of 04/22/2021  Medication   sodium chloride flush (NS) 0.9 % injection 3 mL   Reviewed chart for medication changes ahead of medication coordination call.   BP Readings from Last 3 Encounters:  03/29/21 126/80  03/25/21 (!) 186/105  02/28/21 (!) 156/84    Lab Results  Component Value Date   HGBA1C 8.4 (H) 04/21/2021     Patient obtains medications through Adherence Packaging  30 Days    Last adherence delivery included: Rexulti 3 mg - take 1 tablet at bedtime Duloxetine 20 mg  - take 1 capsule before breakfast Duloxetine 60 mg - take 1 capsule at bedtime Clonidine 0.1 mg - take 1 tablet two times daily Warfarin 5 mg - TAKE 1 tablet daily except  Monday, Wednesday & Friday  Warfarin 10 mg - take 1 tablet Monday, Wednesday and Friday Cartia XT 240 mg - Take 1 capsule  daily Furosemide 80 mg - Take 1 tablet twice daily Metformin 500 mg - take 1 tablet at bedtime Invokana 100 mg - take 1 tablet before breakfast Potassium CL ER 17m take 2 capsules at bedtime Pravastatin 40 mg  - take 1 tablet at dinner Omeprazole - 20 mg - take 1 capsule before breakfast and at dinner Linzess 145 mcg - Take one capsule every other day Spironolactone 25 mg - take 1/2 tablet at breakfast    Patient declined (meds) last month: No medications declined   Patient is due for next adherence delivery on: 05/03/2021   Called patient and reviewed medications and coordinated delivery.   This delivery to include: Rexulti 3 mg - take 1 tablet at breakfast Duloxetine 20 mg  - take 1 capsule at bedtime Duloxetine 60 mg - take 1 capsule at breakfast Clonidine 0.1 mg - take 1 tablet before breakfast and 1 tablet at bedtime Warfarin 5 mg - take 1 tablet daily except  Monday, Wednesday & Friday  Warfarin 10 mg - take 1 tablet Monday, Wednesday and Friday Cartia XT 240 mg - take 1 capsule before breakfast Furosemide 80 mg - take 1  tablet at breakfast and 1 tablet at dinner Metformin 500 mg - take 1 tablet at bedtime Invokana 100 mg - take 1 tablet before breakfast Potassium CL ER 1078m-  take 2 capsules at bedtime Pravastatin 40 mg  - take 1 tablet at dinner Omeprazole - 20 mg - take 1 capsule before breakfast and 1 capsule at dinner Linzess 145 mcg - Take one capsule every other day Spironolactone 25 mg - take 1/2 tablet at breakfast Seroquel 50 mg - take 1 tablet at breakfast and bedtime   Patient will need a short fill:No short fill needed   Coordinated acute fill:No acute fill needed.   Patient declined the following medications: No medications declined.   Confirmed delivery date of 05/03/2021 , advised patient that pharmacy will contact them the morning of delivery.   Care Gaps: AWV - scheduled 03/02/2021 Last  BP - 126/80 on 03/29/2021 Last A1C - 8.4 on 04/21/2021 Hep C Screen - never done Papsmear - never done Foot exam - overdue  Star Rating Drugs: Invokana 100 mg - last filled 03/29/2021 30 DS at [facility] Pravachol 40 mg - last filled 03/29/2021 30 DS at [facility] Metformin 500 mg - last filled 03/29/2021 30 DS at [facility] [phone]

## 2021-04-25 ENCOUNTER — Telehealth: Payer: Self-pay | Admitting: Oncology

## 2021-04-25 ENCOUNTER — Ambulatory Visit (INDEPENDENT_AMBULATORY_CARE_PROVIDER_SITE_OTHER): Payer: Medicare Other

## 2021-04-25 ENCOUNTER — Other Ambulatory Visit: Payer: Self-pay

## 2021-04-25 DIAGNOSIS — Z7901 Long term (current) use of anticoagulants: Secondary | ICD-10-CM

## 2021-04-25 DIAGNOSIS — R600 Localized edema: Secondary | ICD-10-CM | POA: Diagnosis not present

## 2021-04-26 ENCOUNTER — Telehealth: Payer: Self-pay | Admitting: Family Medicine

## 2021-04-26 ENCOUNTER — Other Ambulatory Visit: Payer: Commercial Managed Care - HMO

## 2021-04-26 ENCOUNTER — Inpatient Hospital Stay: Payer: Medicare Other

## 2021-04-26 NOTE — Telephone Encounter (Signed)
Pt call and stated she want dr. Volanda Napoleon to sent a RX to  [facility] - [place], [place] - [address] Phone:  [phone]  Fax:  [phone]    So they can bring her out some iron pill.

## 2021-04-28 ENCOUNTER — Telehealth: Payer: Self-pay | Admitting: Pharmacist

## 2021-04-28 NOTE — Chronic Care Management (AMB) (Signed)
° ° °  Chronic Care Management Pharmacy Assistant   Name: [name]  MRN: [id number] DOB: 1955-09-06  Reason for Encounter: Notify patient of need for appointment Patient needs to call Dr. Marlou Sa (psychiatry) to schedule an appointment.  Refill request for Cymbalta and Rexulti were denied as patient needs to be seen.   Dr. Sherlynn Stalls did send over a refill of both medications to [facility].   Unable to reach patient after several attempts.   Care Gaps: AWV - scheduled 03/02/2021 Last BP - 126/80 on 03/29/2021 Last A1C - 8.4 on 04/21/2021 Papsmear - never done Foot exam - overdue  Star Rating Drugs: Invokana 100 mg - last filled 03/29/2021 30 DS at [facility] Pravachol 40 mg - last filled 03/29/2021 30 DS at [facility] Metformin 500 mg - last filled 03/29/2021 30 DS at [facility] [phone]

## 2021-04-29 NOTE — Telephone Encounter (Signed)
patient called states she was sick and could not come in today. she will call for another appointment

## 2021-04-29 NOTE — Telephone Encounter (Signed)
left message on Lev[facility] Dy phone about her mothers lab appointment 2/3/@10 :76

## 2021-05-03 ENCOUNTER — Other Ambulatory Visit: Payer: Self-pay

## 2021-05-03 ENCOUNTER — Inpatient Hospital Stay: Payer: Medicare Other | Attending: Oncology

## 2021-05-03 DIAGNOSIS — Z86718 Personal history of other venous thrombosis and embolism: Secondary | ICD-10-CM | POA: Insufficient documentation

## 2021-05-03 DIAGNOSIS — Z7901 Long term (current) use of anticoagulants: Secondary | ICD-10-CM

## 2021-05-03 LAB — PROTIME-INR
INR: 1.2 (ref 0.8–1.2)
Prothrombin Time: 15.2 seconds (ref 11.4–15.2)

## 2021-05-04 ENCOUNTER — Other Ambulatory Visit: Payer: Self-pay | Admitting: Family Medicine

## 2021-05-04 DIAGNOSIS — E611 Iron deficiency: Secondary | ICD-10-CM

## 2021-05-04 MED ORDER — IRON (FERROUS SULFATE) 325 (65 FE) MG PO TABS
325.0000 mg | ORAL_TABLET | Freq: Every day | ORAL | 0 refills | Status: DC
Start: 2021-05-04 — End: 2021-07-19

## 2021-05-04 NOTE — Telephone Encounter (Signed)
Prescription was sent

## 2021-05-04 NOTE — Progress Notes (Signed)
Called the patient no answer 

## 2021-05-04 NOTE — Telephone Encounter (Signed)
patient needed to change appointmet to next Tuesday 2/14 @10 :00

## 2021-05-05 ENCOUNTER — Telehealth: Payer: Self-pay

## 2021-05-05 NOTE — Telephone Encounter (Signed)
Chart review.

## 2021-05-05 NOTE — Telephone Encounter (Signed)
-----   Message from Ladell Pier, MD sent at 05/03/2021  2:36 PM EST ----- Please call patient, PT/INR is low, is she taking Coumadin?,  Missed doses?,  If she taking any vitamins?

## 2021-05-05 NOTE — Telephone Encounter (Signed)
Called and spoke with the patient. Patient confirmed she  missed a day of taking her coumadin. Patient states she is taking the 10 mg on Mon, Wed, Fri and other days 5 mg.She also states she is taking ferrous Sulfate 325 mg.I informed the patient she need to be taking 10 mg on Tues, Thurs, Sat, Sun and 5 mg on others days. Patient voiced understanding of instruction and had no further questions or concerns at this time.

## 2021-05-11 ENCOUNTER — Telehealth (INDEPENDENT_AMBULATORY_CARE_PROVIDER_SITE_OTHER): Payer: Medicare Other | Admitting: Family Medicine

## 2021-05-11 ENCOUNTER — Encounter: Payer: Self-pay | Admitting: Family Medicine

## 2021-05-11 DIAGNOSIS — R5383 Other fatigue: Secondary | ICD-10-CM

## 2021-05-11 DIAGNOSIS — M797 Fibromyalgia: Secondary | ICD-10-CM | POA: Diagnosis not present

## 2021-05-11 DIAGNOSIS — R778 Other specified abnormalities of plasma proteins: Secondary | ICD-10-CM

## 2021-05-11 DIAGNOSIS — G4733 Obstructive sleep apnea (adult) (pediatric): Secondary | ICD-10-CM | POA: Diagnosis not present

## 2021-05-11 DIAGNOSIS — E876 Hypokalemia: Secondary | ICD-10-CM | POA: Diagnosis not present

## 2021-05-11 MED ORDER — GABAPENTIN 300 MG PO CAPS: ORAL_CAPSULE | ORAL | 2 refills | Status: DC

## 2021-05-11 NOTE — Progress Notes (Signed)
Virtual Visit via Telephone Note  I connected with [name] on 05/11/21 at  2:30 PM EST by telephone and verified that I am speaking with the correct person using two identifiers.   I discussed the limitations, risks, security and privacy concerns of performing an evaluation and management service by telephone and the availability of in person appointments. I also discussed with the patient that there may be a patient responsible charge related to this service. The patient expressed understanding and agreed to proceed.  Location patient: home Location provider: work or home office Participants present for the call: patient, provider Patient did not have a visit in the prior 7 days to address this/these issue(s).   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  Pt states she is still tired despite starting iron supplement.  Feeling of increased tiredness/fatigue ongoing x4 months.  Pt is not having any SOB.  Mentions having drooling when talking.  Started 1 week ago.  Noticed by others.  Patient denies dysphagia, dystonia.  Pt was restarted on Seroquel due to increased depression symptoms.  Pt felt like she could "end it all".  States she is frustrated that she continues to feel tired and can't do the things she wants to do.  Pt lays on her bed or in her chair and has involuntary rocking.  States people look at her "like I'm crazy" when she does this.  When pt was on seroquel in the past she did not have any issues.  Patient seen by [facility].  States she was notified the office/her provider moved.  Was advised to contact [facility] on [facility].  Has an appointment on March 7.  Pt needs refill on Gabapentin for fibromyalgia.  [facility]  [facility]ill [facility] to be included in pill pack.   Patient has an appointment next month with pulmonology/sleep medicine regarding her CPAP. Previously had difficulty scheduling sleep study as the appointment was made in [place].  Patient has to arrange for transportation for appointments as she does not feel safe driving due to the fatigue.  Observations/Objective: Patient sounds cheerful and well on the phone. I do not appreciate any SOB. Speech and thought processing are grossly intact. Patient reported vitals:  Assessment and Plan: Elevated total protein  -Total protein 8.5 on 04/21/2021 -Per chart review several normal total proteins but previously elevated total protein at 8.3 and 8.5 on 03/14/2019 and 02/02/2020 respectively. -We will attempt to obtain SPEP during visit with Oncology so pt does not have to worry about scheduling transportation. - Plan: Protein Electrophoresis, Serum, CMP  Other fatigue -Reviewed labs from 04/21/21 with pt. -Advised fatigue likely multifactorial iron deficiency, fibromyalgia, medications, and depression -Advised to have a discussion with psychiatry regarding medications such as Seroquel and possible S/Es -Encouraged to keep appointment with sleep medicine  Fibromyalgia  - Plan: gabapentin (NEURONTIN) 300 MG capsule  Morbid obesity ([place]) -Psychiatric medications likely contributing to weight gain. -Pain from fibromyalgia and fatigue making it difficult for patient to move around -Continue lifestyle modifications -Consider chair exercises -Continue CPAP  OSA (obstructive sleep apnea) -Continue CPAP -Encouraged to keep upcoming appointment with pulmonology/sleep medicine  Hypokalemia -Potassium 3.1 on 04/21/2021 -Patient took extra dose of potassium x3 days. -Recheck -Plan: CMP  Follow Up  Instructions: F/u in 1 month, sooner if needed.   [id number] [id number] [id number] 11-20 9443 21-30 I did not refer this patient for an OV in the  next 24 hours for this/these issue(s).  I discussed the assessment and treatment plan with the patient. The patient was provided an opportunity to ask questions and all were answered. The patient agreed with the plan and demonstrated an understanding of the instructions.   The patient was advised to call back or seek an in-person evaluation if the symptoms worsen or if the condition fails to improve as anticipated.  I provided 27 minutes of non-face-to-face time during this encounter.   Billie Ruddy, MD

## 2021-05-11 NOTE — Progress Notes (Signed)
Called pt at 2:32 pm to get ready for visit, no answer, left voicemail.

## 2021-05-12 DIAGNOSIS — J449 Chronic obstructive pulmonary disease, unspecified: Secondary | ICD-10-CM | POA: Diagnosis not present

## 2021-05-16 ENCOUNTER — Other Ambulatory Visit: Payer: Self-pay

## 2021-05-16 ENCOUNTER — Inpatient Hospital Stay: Payer: Medicare Other

## 2021-05-16 DIAGNOSIS — Z7901 Long term (current) use of anticoagulants: Secondary | ICD-10-CM

## 2021-05-16 DIAGNOSIS — I82403 Acute embolism and thrombosis of unspecified deep veins of lower extremity, bilateral: Secondary | ICD-10-CM

## 2021-05-16 DIAGNOSIS — Z86718 Personal history of other venous thrombosis and embolism: Secondary | ICD-10-CM | POA: Diagnosis not present

## 2021-05-16 LAB — PROTIME-INR
INR: 1.9 — ABNORMAL HIGH (ref 0.8–1.2)
Prothrombin Time: 21.8 seconds — ABNORMAL HIGH (ref 11.4–15.2)

## 2021-05-17 ENCOUNTER — Telehealth: Payer: Self-pay | Admitting: *Deleted

## 2021-05-17 NOTE — Telephone Encounter (Signed)
Notified patient that INR is 1.9 and getting closer to therapeutic.  MD said to stay on same dose and recheck in 1 month + 3/27 at 0800.  She reports taking 10 daily and 5 mg on MWF. Still reports feeling fatigue.

## 2021-05-17 NOTE — Telephone Encounter (Signed)
-----   Message from Ladell Pier, MD sent at 05/17/2021  6:59 AM EST ----- Please call patient, continue coumadin , repeat PT INR 1 month

## 2021-05-20 ENCOUNTER — Other Ambulatory Visit: Payer: Self-pay | Admitting: Family Medicine

## 2021-05-20 ENCOUNTER — Other Ambulatory Visit: Payer: Self-pay

## 2021-05-20 ENCOUNTER — Other Ambulatory Visit: Payer: Self-pay | Admitting: Oncology

## 2021-05-20 ENCOUNTER — Telehealth: Payer: Self-pay | Admitting: Pharmacist

## 2021-05-20 DIAGNOSIS — E669 Obesity, unspecified: Secondary | ICD-10-CM

## 2021-05-20 DIAGNOSIS — E1169 Type 2 diabetes mellitus with other specified complication: Secondary | ICD-10-CM

## 2021-05-20 MED ORDER — WARFARIN SODIUM 10 MG PO TABS: ORAL_TABLET | ORAL | 1 refills | Status: DC

## 2021-05-20 NOTE — Chronic Care Management (AMB) (Addendum)
? ? ?Chronic Care Management ?Pharmacy Assistant  ? ?Name: [name]  MRN: [id number] DOB: 06-Mar-1956 ? ?Reason for Encounter: Medication Review / Medication Coordination Call ?  ?Conditions to be addressed/monitored: ?HTN ? ?Recent office visits:  ?05/11/2021 [name] MD - Patient was seen for elevated total protein and additional issues. No medication changes. Follow up in 1 month. ? ?Recent consult visits:  ?None ? ?Hospital visits:  ?None ? ?Medications: ?Outpatient Encounter Medications as of 05/20/2021  ?Medication Sig Note  ? ACCU-CHEK GUIDE test strip USE UP TO FOUR TIMES DAILY AS DIRECTED.   ? Accu-Chek Softclix Lancets lancets USE UP TO FOUR TIMES DAILY AS DIRECTED.   ? albuterol (PROVENTIL HFA;VENTOLIN HFA) 108 (90 BASE) MCG/ACT inhaler Inhale 2 puffs into the lungs every 4 (four) hours as needed for shortness of breath.   ? albuterol (PROVENTIL) (2.5 MG/3ML) 0.083% nebulizer solution USE 1 VIAL IN NEBULIZER EVERY 6 HOURS - and as needed   ? blood glucose meter kit and supplies KIT Dispense based on patient and insurance preference. Use up to four times daily as directed. (FOR ICD-9 250.00, 250.01).   ? brexpiprazole (REXULTI) 2 MG TABS tablet Take 1 tablet by mouth daily.   ? budesonide-formoterol (SYMBICORT) 160-4.5 MCG/ACT inhaler Inhale 2 puffs into the lungs 2 (two) times daily.   ? canagliflozin (INVOKANA) 100 MG TABS tablet Take 1 tablet (100 mg total) by mouth daily before breakfast.   ? CARTIA XT 240 MG 24 hr capsule Take 1 capsule (240 mg total) by mouth daily.   ? cloNIDine (CATAPRES) 0.1 MG tablet TAKE ONE TABLET BY MOUTH BEFORE BREAKFAST and TAKE ONE TABLET BY MOUTH EVERYDAY AT BEDTIME   ? diclofenac sodium (VOLTAREN) 1 % GEL Apply 2 g topically daily as needed (fibromyalgia pain).   ? DULoxetine (CYMBALTA) 20 MG capsule Take 20 mg by mouth daily.   ? DULoxetine (CYMBALTA) 60 MG capsule Take 60 mg by mouth at bedtime.    ? fluticasone (FLONASE) 50 MCG/ACT nasal spray Place 2  sprays into both nostrils 2 (two) times daily.   ? furosemide (LASIX) 80 MG tablet TAKE ONE TABLET BY MOUTH TWICE DAILY   ? gabapentin (NEURONTIN) 300 MG capsule TAKE 1 CAPSULE(300 MG) BY MOUTH TWICE DAILY   ? Iron, Ferrous Sulfate, 325 (65 Fe) MG TABS Take 325 mg by mouth daily.   ? levocetirizine (XYZAL) 5 MG tablet Take 1 tablet (5 mg total) by mouth at bedtime.   ? LINZESS 145 MCG CAPS capsule Take 1 capsule (145 mcg total) by mouth every other day.   ? meclizine (ANTIVERT) 25 MG tablet Take 1 tablet (25 mg total) by mouth 3 (three) times daily as needed for dizziness.   ? metFORMIN (GLUCOPHAGE) 500 MG tablet Take 1 tablet (500 mg total) by mouth at bedtime.   ? metoCLOPramide (REGLAN) 10 MG tablet Take 1 tablet (10 mg total) by mouth every 6 (six) hours as needed for nausea (or headache).   ? omeprazole (PRILOSEC) 20 MG capsule Take 1 capsule (20 mg total) by mouth 2 (two) times daily.   ? potassium chloride (MICRO-K) 10 MEQ CR capsule TAKE TWO CAPSULES BY MOUTH EVERYDAY AT BEDTIME   ? pravastatin (PRAVACHOL) 40 MG tablet TAKE ONE TABLET BY MOUTH EVERY EVENING   ? spironolactone (ALDACTONE) 25 MG tablet Take 0.5 tablets (12.5 mg total) by mouth daily.   ? umeclidinium-vilanterol (ANORO ELLIPTA) 62.5-25 MCG/INH AEPB Inhale into the lungs.   ? Vitamin D, Ergocalciferol, (  DRISDOL) 1.25 MG (50000 UNIT) CAPS capsule Take 1 capsule (50,000 Units total) by mouth every 7 (seven) days.   ? warfarin (COUMADIN) 10 MG tablet TAKE 1 TABLET BY MOUTH ONCE A DAY ON MONDAYS, WEDNESDAYS, AND FRIDAYS and 2 tabs (54m) on Tues, Thurs, Sat, Sun. 05/17/2021: Reports taking 10 mg daily (except 5 mg on MWF)  ? warfarin (COUMADIN) 5 MG tablet TAKE ONE TABLET BY MOUTH ONCE daily EXCEPT ON Mondays, Wednesdays, AND Fridays OR as directed 05/17/2021: Reports taking 5 mg on MWF only  ? zonisamide (ZONEGRAN) 50 MG capsule 1 TABLET AM AND 2 TABLETS PM   ? ?Facility-Administered Encounter Medications as of 05/20/2021  ?Medication  ? sodium  chloride flush (NS) 0.9 % injection 3 mL  ? ?Reviewed chart for medication changes ahead of medication coordination call. ? ?No OVs, Consults, or hospital visits since last care coordination call/Pharmacist visit. (If appropriate, list visit date, provider name) ? ?No medication changes indicated OR if recent visit, treatment plan here. ? ?BP Readings from Last 3 Encounters:  ?03/29/21 126/80  ?03/25/21 (!) 186/105  ?02/28/21 (!) 156/84  ?  ?Lab Results  ?Component Value Date  ? HGBA1C 8.4 (H) 04/21/2021  ?  ? ?Patient obtains medications through Adherence Packaging  30 Days  ?  ?Last adherence delivery included: ?Rexulti 3 mg - take 1 tablet at breakfast ?Duloxetine 20 mg  - take 1 capsule at bedtime ?Duloxetine 60 mg - take 1 capsule at breakfast ?Clonidine 0.1 mg - take 1 tablet before breakfast and 1 tablet at bedtime ?Warfarin 5 mg - take 1 tablet daily except  Monday, Wednesday & Friday  ?Warfarin 10 mg - take 1 tablet Monday, Wednesday and Friday ?Cartia XT 240 mg - take 1 capsule before breakfast ?Furosemide 80 mg - take 1 tablet at breakfast and 1 tablet at dinner ?Metformin 500 mg - take 1 tablet at bedtime ?Invokana 100 mg - take 1 tablet before breakfast ?Potassium CL ER 127m -  take 2 capsules at bedtime ?Pravastatin 40 mg  - take 1 tablet at dinner ?Omeprazole - 20 mg - take 1 capsule before breakfast and 1 capsule at dinner ?Linzess 145 mcg - Take one capsule every other day ?Spironolactone 25 mg - take 1/2 tablet at breakfast ?Seroquel 50 mg - take 1 tablet at breakfast and bedtime ?  ?  ?Patient declined (meds) last month: No medications declined ? ? ?  ?Patient is due for next adherence delivery on: 06/01/2021 ?  ?Called patient and reviewed medications and coordinated delivery. ?  ?This delivery to include: ?Rexulti 2 mg - take 1 tablet at breakfast ?Duloxetine 30 mg  - take 3 capsules at bedtime ?Clonidine 0.1 mg - take 1 tablet before breakfast and 1 tablet at bedtime ?Warfarin 10 mg - take 1  tablet daily except  Monday, Wednesday & Friday  ?Warfarin 5 mg - take 1 tablet Monday, Wednesday and Friday ?Cartia XT 240 mg - take 1 capsule before breakfast ?Furosemide 80 mg - take 1 tablet at breakfast and 1 tablet at dinner ?Metformin 500 mg - take 1 tablet at bedtime ?Invokana 100 mg - take 1 tablet before breakfast ?Potassium CL ER 107m-  take 2 capsules at bedtime ?Pravastatin 40 mg  - take 1 tablet at dinner ?Omeprazole - 20 mg - take 1 capsule before breakfast and 1 capsule at dinner ?Linzess 145 mcg - Take one capsule every other day ?Spironolactone 25 mg - take 1/2 tablet at breakfast ?Seroquel  50 mg - take 1 tablet at breakfast and bedtime  ?Symbicort 160/4.5 - use 2 puff twice daily ?Ferrous Sulfate 325 mg - take once daily at breakfast ?Gabapentin 300 mg - take 1 capsule with breakfast and dinner ?Albuterol HFA as needed ?Accu-chek test strips ?Accu-chek lancets ? ?  ?Patient will need a short fill:No short fill needed ?  ?Coordinated acute fill:No acute fill needed. ?  ?Patient declined the following medications: No medications declined. ?  ?Confirmed delivery date of 06/01/2021.   ?  ?Care Gaps: ?AWV - scheduled 02/24/2022 ?Last BP - 126/80 on 03/29/2021 ?Last A1C - 8.4 on 04/21/2021 ?Shingrix - never done ?Papsmear - never done ?Foot exam - overdue ?  ?Star Rating Drugs: ?Invokana 100 mg - last filled 04/28/2021 30 DS at [facility] ?Pravachol 40 mg - last filled 04/28/2021 30 DS at [facility] ?Metformin 500 mg - last filled 04/28/2021 30 DS at [facility] ?  ? ?[place]  ?Clinical Pharmacist Assistant ?[phone] ? ?Spoke with patient and confirmed all medication changes and delivery date. ? ?[name], PharmD, BCACP ?Clinical Pharmacist ?Therapist, music at [facility] ?[phone] ? ? ?

## 2021-05-24 DIAGNOSIS — Z79891 Long term (current) use of opiate analgesic: Secondary | ICD-10-CM | POA: Diagnosis not present

## 2021-05-25 ENCOUNTER — Other Ambulatory Visit: Payer: Self-pay

## 2021-05-25 MED ORDER — ACCU-CHEK GUIDE VI STRP: ORAL_STRIP | 1 refills | Status: DC

## 2021-05-25 MED ORDER — ACCU-CHEK SOFTCLIX LANCETS MISC: 0 refills | Status: DC

## 2021-05-26 ENCOUNTER — Other Ambulatory Visit: Payer: Self-pay | Admitting: Family Medicine

## 2021-05-26 ENCOUNTER — Other Ambulatory Visit: Payer: Self-pay

## 2021-05-26 DIAGNOSIS — J4540 Moderate persistent asthma, uncomplicated: Secondary | ICD-10-CM

## 2021-05-26 MED ORDER — ALBUTEROL SULFATE HFA 108 (90 BASE) MCG/ACT IN AERS: 2.0000 | INHALATION_SPRAY | RESPIRATORY_TRACT | 11 refills | Status: DC | PRN

## 2021-05-29 NOTE — Progress Notes (Unsigned)
Cardiology Office Note:    Date:  05/29/2021   ID:  [name], DOB 13-Apr-1955, MRN [id number]  PCP:  Billie Ruddy, MD  [facility] Cardiologist:  [name] MD [place] Electrophysiologist:  None   CC: Post RHC  History of Present Illness:    [name] is a 66 y.o. 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  In interim had ziopatch with no concerning findings.  Last seen 04/13/20.  In interim of this visit, patient CCTA and Echo- notable from improved LAP and aortic atherosclerosis, and a hiatal hernia.  During that visit started new lasix dose.  Seen in Fall 2022 and Early 2023 that need with RHC where patient was euvolemic.  Patient notes that she is doing ***.   Since day prior/last visit notes *** . There are no*** interval hospital/ED visit.    No chest pain or pressure ***.  No SOB/DOE*** and no PND/Orthopnea***.  No weight gain or leg swelling***.  No palpitations or syncope ***.  Ambulatory blood pressure ***.    Past Medical History:  Diagnosis Date   Adrenal mass ([place]) 03/226   Benign   Arthritis    knees/multiple orthopedic conditons; lower back   Asthma    per pt   Clotting disorder (HCC)    +beta-2-glycoprotein IgA antibody   COPD (chronic obstructive pulmonary disease) (HCC)    inhalers dependent on environment   Depression    Diabetes mellitus    120s usually fasting -  dx more than 10 yrs ago   Dizziness, nonspecific    DVT (deep venous thrombosis) (HCC)    Recurrent   Fibromyalgia    GERD (gastroesophageal reflux disease)    Heart murmur    History of blood transfusion    History of cocaine abuse ([place])    Remote history    Hyperlipidemia    Hypertension    takes meds daily   Hypothyroidism    Lupus Anticoagulant Positive    On home oxygen therapy    at  night   Shortness of breath    exertion or lying flat   Sleep apnea    2l of oxygen at night (as of 12/6, she used to)    Past Surgical History:  Procedure Laterality Date   ABDOMINAL HYSTERECTOMY  1989   Fibroids   ANTERIOR LUMBAR FUSION  01/03/2012   Procedure: ANTERIOR LUMBAR FUSION 1 LEVEL;  Surgeon: Winfield Cunas, MD;  Location: [place] [facility];  Service: Neurosurgery;  Laterality: N/A;  Lumbar Four-Five Anterior Lumbar Interbody Fusion with Instrumentation   BACK SURGERY     cervical spine---disk disease   CARPAL TUNNEL RELEASE     Bilateral   CESAREAN SECTION     CHOLECYSTECTOMY     CYSTOSCOPY/RETROGRADE/URETEROSCOPY/STONE EXTRACTION WITH BASKET Right 04/26/2014   Procedure: CYSTOSCOPY/ RIGHT RETROGRADE/ RIGHT URETEROSCOPY/URETERAL AND RENAL PELVIS BIOPSY;  Surgeon: Alexis Frock, MD;  Location: [facility];  Service: Urology;  Laterality: Right;   gallstones removed     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   bilateral   right elbow surgery     RIGHT HEART CATH N/A 02/28/2021   Procedure: RIGHT HEART CATH;  Surgeon: Burnell Blanks, MD;  Location: [place] [facility];  Service: Cardiovascular;  Laterality: N/A;   THYROID LOBECTOMY Right 02/27/2017   THYROID LOBECTOMY  Right 02/27/2017   Procedure: RIGHT THYROID LOBECTOMY;  Surgeon: Erroll Luna, MD;  Location: [facility];  Service: General;  Laterality: Right;   TUBAL LIGATION      Current Medications: No outpatient medications have been marked as taking for the 05/30/21 encounter (Appointment) with Werner Lean, MD.   Current Facility-Administered Medications for the 05/30/21 encounter (Appointment) with Werner Lean, MD  Medication   sodium chloride flush (NS) 0.9 % injection 3 mL    Allergies:   Lisinopril and Corticosteroids   Social History   Socioeconomic History   Marital status: Divorced    Spouse name: Not on file   Number of children: Not on file   Years of education: Not on file    Highest education level: Not on file  Occupational History   Not on file  Tobacco Use   Smoking status: Never   Smokeless tobacco: Never  Vaping Use   Vaping Use: Never used  Substance and Sexual Activity   Alcohol use: No    Alcohol/week: 0.0 standard drinks    Comment: beer in past    Drug use: Yes    Types: Cocaine    Comment: quit 2003-rehab progam in 2005   Sexual activity: Never  Other Topics Concern   Not on file  Social History Narrative   Lives alone in apartment, drives   Social Determinants of Health   Financial Resource Strain: Low Risk    Difficulty of Paying Living Expenses: Not hard at all  Food Insecurity: No Food Insecurity   Worried About Charity fundraiser in the Last Year: Never true   [place] in the Last Year: Never true  Transportation Needs: No Transportation Needs   Lack of Transportation (Medical): No   Lack of Transportation (Non-Medical): No  Physical Activity: Insufficiently Active   Days of Exercise per Week: 3 days   Minutes of Exercise per Session: 10 min  Stress: No Stress Concern Present   Feeling of Stress : Only a little  Social Connections: Moderately Isolated   Frequency of Communication with Friends and Family: Three times a week   Frequency of Social Gatherings with Friends and Family: Three times a week   Attends Religious Services: Never   Active Member of Clubs or Organizations: Yes   Attends Archivist Meetings: Never   Marital Status: Divorced    Family History: The patient's family history includes COPD in an other family member; Cancer in her mother; Diabetes in her mother; Heart failure in her father; Hyperlipidemia in an other family member; Hypertension in her father and mother. History of coronary artery disease notable for no members. History of heart failure notable for both parents. History of arrhythmia notable for no members. Denies family history of sudden cardiac death including drowning, car  accidents, or unexplained deaths in the family. Mother->heart murmur  ROS:   Please see the history of present illness.    All other systems reviewed and are negative.  EKGs/Labs/Other Studies Reviewed:    The following studies were reviewed today:  EKG:  02/03/21: SR rate 80 LVH 04/13/20: SR 92 LVH 02/02/20:  SR Rate 85 LVH with secondary repolarization  Transthoracic Echocardiogram Date:05/04/20  Results:   IMPRESSIONS   1. Left ventricular ejection fraction, by estimation, is 60 to 65%. The  left ventricle has normal function. The left ventricle has no regional  wall motion abnormalities. Left ventricular diastolic parameters are  consistent with Grade I diastolic  dysfunction (impaired relaxation).   2. Right ventricular systolic function is normal. The right ventricular  size is normal.   3. The mitral valve is normal in structure. No evidence of mitral valve  regurgitation. No evidence of mitral stenosis.   4. The aortic valve is normal in structure. Aortic valve regurgitation is  not visualized.    Cardiac Monitor: Date: 03/25/20 Results:  Patient had a minimum heart rate of 67 bpm, maximum heart rate of 164 bpm, and average heart rate of 88 bpm. Predominant underlying rhythm was sinus rhythm. One run of ventricular tachycardia occurred lasting 5 beats at longest with a max rate of 164 bpm at fastest. Thirteen runs of supraventricular tachycardia occurred lasting 14 beats at longest with a max rate of 162 bpm at fastest. Isolated PACs were rare (<1.0%), with rare couplets and triplets present. Isolated PVCs were rare (<1.0%), with rare couplets and triplets present; bigeminy and trigeminy present. No evidence of complete heart block . Triggered and diary events associated overwhelming with sinus rhythm or sinus tachycardia; once with PVC.   No malignant arrhythmias.   CardiacCT : Date:05/07/20 Results: IMPRESSION: 1. Coronary calcium score of 0. This was 1st  percentile for age, sex, and race matched control. 2. Normal coronary origin with right dominance. 3. CAD-RADS 0. No evidence of CAD (0%). Consider non-atherosclerotic causes of discomfort.   IMPRESSION: 1. No acute findings in the imaged extracardiac chest. 2. Hepatic steatosis. 3. Small hiatal hernia.  Recent Labs: 02/07/2021: Magnesium 1.8 04/21/2021: ALT 20; BUN 17; Creatinine, Ser 0.87; Hemoglobin 12.7; Platelets 316.0; Potassium 3.1; Pro B Natriuretic peptide (BNP) 25.0; Sodium 139; TSH 5.15  Recent Lipid Panel    Component Value Date/Time   CHOL 194 02/02/2020 1126   TRIG 196 (H) 02/02/2020 1126   HDL 76 02/02/2020 1126   CHOLHDL 2.6 02/02/2020 1126   VLDL 20 04/24/2014 2245   LDLCALC 89 02/02/2020 1126    Physical Exam:    VS:  There were no vitals taken for this visit.    Wt Readings from Last 3 Encounters:  03/29/21 291 lb 9.6 oz (132.3 kg)  03/25/21 293 lb (132.9 kg)  03/02/21 288 lb (130.6 kg)    Gen: mild distress, Obese female   Neck: No JVD Cardiac: No Rubs or Gallops, no Murmur, regular rhythm  +2 radial pulses Respiratory: Clear to auscultation bilaterally, normal effort, normal  respiratory rate GI: Soft, nontender, non-distended  MS: No pitting edema;  moves all extremities Integument: Skin feels warm Neuro:  At time of evaluation, alert and oriented to person/place/time/situation  Psych: Normal affect, patient feels poorly and with total body pain   ASSESSMENT:    No diagnosis found.    PLAN:    Chronic Heart Failure Preserved Ejection Fraction  In the setting of Morbid Obesity, COPD and OSA overlap DVT on chronic AC - NYHA class IV, Stage C, euvolemic etiology in the setting of DM/HTN  - [facility] - Diuretic regimen:Continue Invokana, lasix to 80 mg PO Daily; if increase in Leg swelling, increase to lasix 80 mg PO BID and we will check l - aldactone 12.5 mg PO Daily - had pulm follow as there is a non cardiac component to her disease  Morbid  Obesity/DM/HTN OSA on CPAP - OSA and is on CPAP follow by OSH Sleep Medicine  HLD - no significant aortic atherosclerosis on my review -  LDL Neubert < 100  Asx SVT on heart monitor - no AF; no symptomatic SVT -  AV Nodal Therapy: continue diltiazem  At next visit if she is still unable to improve her SOB, given the above, we will attempt NM Stress Test  (lexiscan).    Medication Adjustments/Labs and Tests Ordered: Current medicines are reviewed at length with the patient today.  Concerns regarding medicines are outlined above.  No orders of the defined types were placed in this encounter.    No orders of the defined types were placed in this encounter.     There are no Patient Instructions on file for this visit.   Signed, Werner Lean, MD  05/29/2021 2:30 PM    [place]

## 2021-05-30 ENCOUNTER — Ambulatory Visit: Payer: Medicare Other | Admitting: Internal Medicine

## 2021-06-01 DIAGNOSIS — I5032 Chronic diastolic (congestive) heart failure: Secondary | ICD-10-CM | POA: Diagnosis not present

## 2021-06-01 DIAGNOSIS — G4733 Obstructive sleep apnea (adult) (pediatric): Secondary | ICD-10-CM | POA: Diagnosis not present

## 2021-06-01 DIAGNOSIS — G4700 Insomnia, unspecified: Secondary | ICD-10-CM | POA: Diagnosis not present

## 2021-06-01 DIAGNOSIS — I10 Essential (primary) hypertension: Secondary | ICD-10-CM | POA: Diagnosis not present

## 2021-06-03 DIAGNOSIS — J449 Chronic obstructive pulmonary disease, unspecified: Secondary | ICD-10-CM | POA: Diagnosis not present

## 2021-06-09 DIAGNOSIS — I82493 Acute embolism and thrombosis of other specified deep vein of lower extremity, bilateral: Secondary | ICD-10-CM | POA: Diagnosis not present

## 2021-06-09 DIAGNOSIS — G4733 Obstructive sleep apnea (adult) (pediatric): Secondary | ICD-10-CM | POA: Diagnosis not present

## 2021-06-09 DIAGNOSIS — D6862 Lupus anticoagulant syndrome: Secondary | ICD-10-CM | POA: Diagnosis not present

## 2021-06-09 DIAGNOSIS — I5031 Acute diastolic (congestive) heart failure: Secondary | ICD-10-CM | POA: Diagnosis not present

## 2021-06-09 DIAGNOSIS — R531 Weakness: Secondary | ICD-10-CM | POA: Diagnosis not present

## 2021-06-09 DIAGNOSIS — R2990 Unspecified symptoms and signs involving the nervous system: Secondary | ICD-10-CM | POA: Diagnosis not present

## 2021-06-09 DIAGNOSIS — R29818 Other symptoms and signs involving the nervous system: Secondary | ICD-10-CM | POA: Diagnosis not present

## 2021-06-09 DIAGNOSIS — G9340 Encephalopathy, unspecified: Secondary | ICD-10-CM | POA: Diagnosis not present

## 2021-06-09 DIAGNOSIS — Z9889 Other specified postprocedural states: Secondary | ICD-10-CM | POA: Diagnosis not present

## 2021-06-09 DIAGNOSIS — R292 Abnormal reflex: Secondary | ICD-10-CM | POA: Diagnosis not present

## 2021-06-09 DIAGNOSIS — R569 Unspecified convulsions: Secondary | ICD-10-CM | POA: Diagnosis not present

## 2021-06-09 DIAGNOSIS — I110 Hypertensive heart disease with heart failure: Secondary | ICD-10-CM | POA: Diagnosis not present

## 2021-06-09 DIAGNOSIS — Z79899 Other long term (current) drug therapy: Secondary | ICD-10-CM | POA: Diagnosis not present

## 2021-06-10 DIAGNOSIS — G4733 Obstructive sleep apnea (adult) (pediatric): Secondary | ICD-10-CM | POA: Diagnosis not present

## 2021-06-11 IMAGING — CR DG CHEST 2V
2 series · 2 of 2 positions shown · non-contrast
Comparison: 09/01/2016

CLINICAL DATA: Wheezing and shortness of breath for 1 month,
history asthma, COPD, former smoker

EXAM:
CHEST - 2 VIEW

[w chest pa]
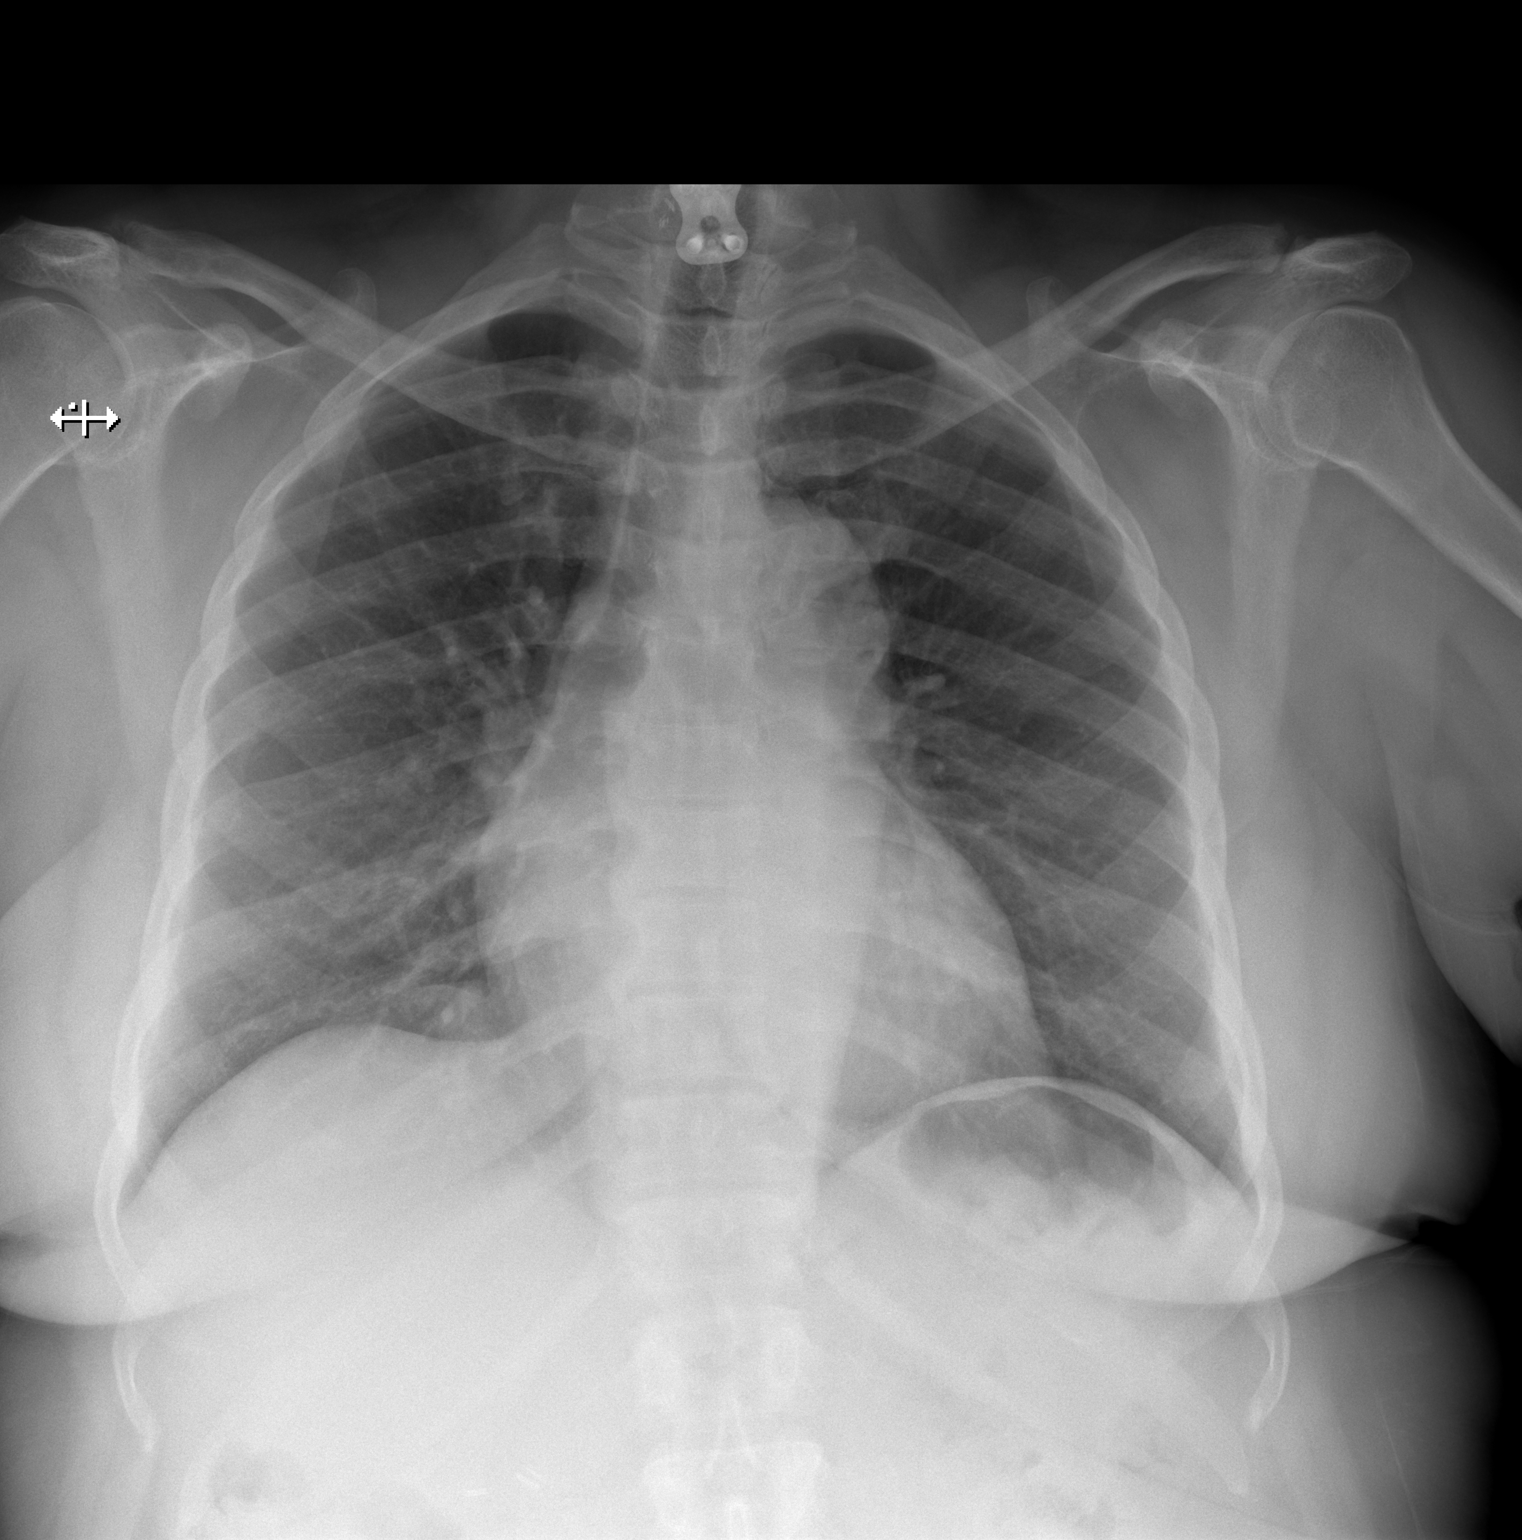

[w chest lat]
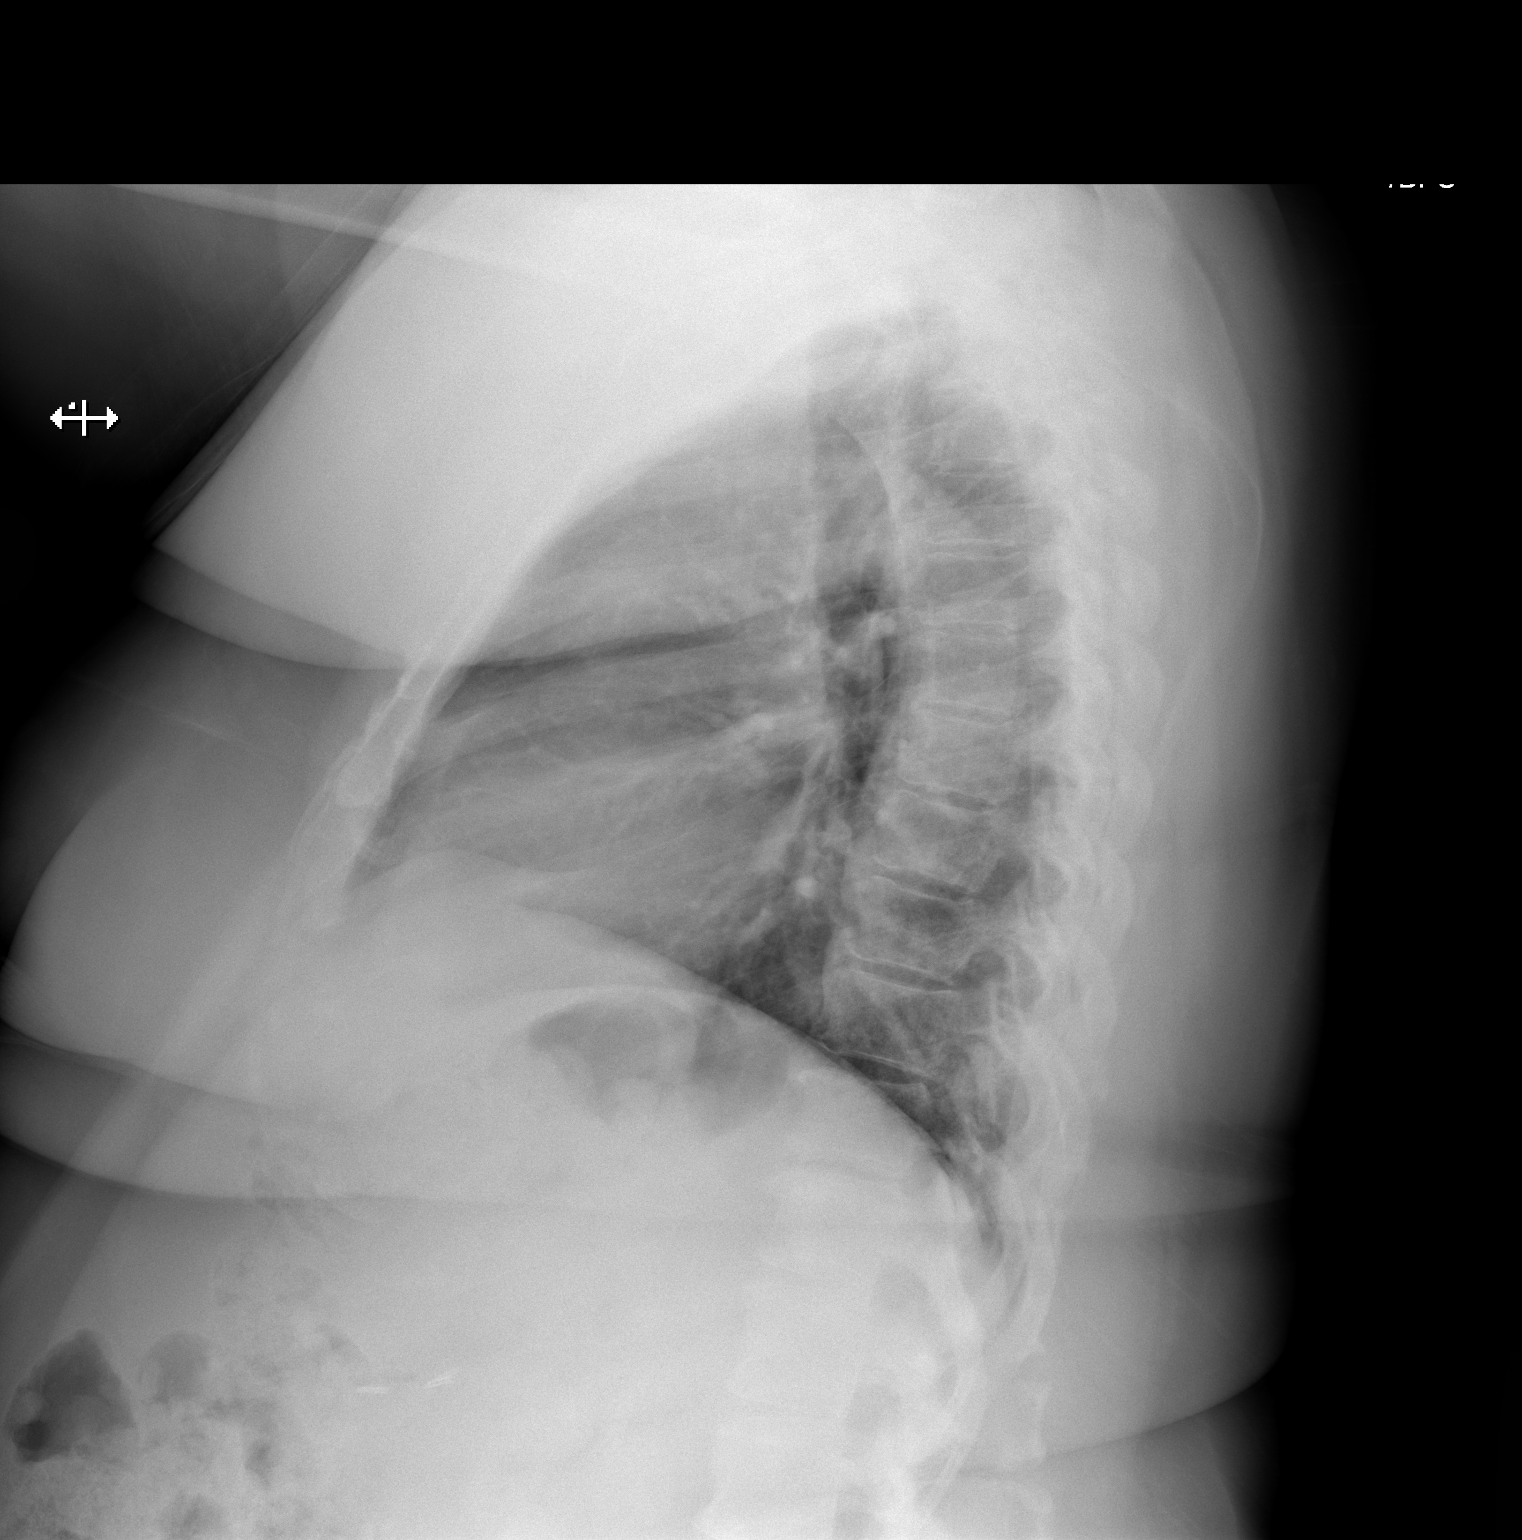

[2 of 2 positions shown; findings below may reference images not displayed]

FINDINGS: Upper normal heart size.

Mediastinal contours and pulmonary vascularity normal.

Lungs clear.

No pulmonary infiltrate, pleural effusion, or pneumothorax.

Prior cervical spine fusion.

No acute osseous findings.
IMPRESSION: No acute abnormalities.

## 2021-06-13 ENCOUNTER — Other Ambulatory Visit: Payer: Self-pay

## 2021-06-13 ENCOUNTER — Inpatient Hospital Stay: Payer: Medicare Other | Attending: Oncology

## 2021-06-13 DIAGNOSIS — Z86718 Personal history of other venous thrombosis and embolism: Secondary | ICD-10-CM | POA: Diagnosis not present

## 2021-06-13 DIAGNOSIS — Z7901 Long term (current) use of anticoagulants: Secondary | ICD-10-CM | POA: Insufficient documentation

## 2021-06-13 DIAGNOSIS — I82403 Acute embolism and thrombosis of unspecified deep veins of lower extremity, bilateral: Secondary | ICD-10-CM

## 2021-06-13 LAB — PROTIME-INR
INR: 1.8 — ABNORMAL HIGH (ref 0.8–1.2)
Prothrombin Time: 21.2 seconds — ABNORMAL HIGH (ref 11.4–15.2)

## 2021-06-15 ENCOUNTER — Telehealth: Payer: Self-pay

## 2021-06-15 NOTE — Telephone Encounter (Signed)
-----   Message from Ladell Pier, MD sent at 06/13/2021  2:12 PM EDT ----- ?Please call patient, continue Coumadin at the current dose, check PT/INR in 1 month ? ?

## 2021-06-15 NOTE — Telephone Encounter (Signed)
Pt verbalized understanding.

## 2021-06-16 DIAGNOSIS — R29818 Other symptoms and signs involving the nervous system: Secondary | ICD-10-CM | POA: Diagnosis not present

## 2021-06-16 DIAGNOSIS — Z8673 Personal history of transient ischemic attack (TIA), and cerebral infarction without residual deficits: Secondary | ICD-10-CM | POA: Diagnosis not present

## 2021-06-16 DIAGNOSIS — R9082 White matter disease, unspecified: Secondary | ICD-10-CM | POA: Diagnosis not present

## 2021-06-20 ENCOUNTER — Other Ambulatory Visit: Payer: Self-pay | Admitting: Family Medicine

## 2021-06-20 ENCOUNTER — Telehealth: Payer: Self-pay | Admitting: Pharmacist

## 2021-06-20 DIAGNOSIS — K219 Gastro-esophageal reflux disease without esophagitis: Secondary | ICD-10-CM

## 2021-06-20 DIAGNOSIS — K5900 Constipation, unspecified: Secondary | ICD-10-CM

## 2021-06-20 NOTE — Chronic Care Management (AMB) (Addendum)
? ? ?Chronic Care Management ?Pharmacy Assistant  ? ?Name: [name]  MRN: [id number] DOB: [birth year] ? ?Reason for Encounter: Medication Review / Medication Coordination Call ?  ?Conditions to be addressed/monitored: ?HTN ? ?Recent office visits:  ?None ? ?Recent consult visits:  ?06/09/2021 [name] MD (Neurology - [facility]) - Patient was seen for Stroke like episode. No medication changes. No follow up noted.  ? ?Hospital visits:  ?None ? ?Medications: ?Outpatient Encounter Medications as of 06/20/2021  ?Medication Sig  ? Accu-Chek Softclix Lancets lancets USE UP TO FOUR TIMES DAILY AS DIRECTED.  ? albuterol (PROVENTIL) (2.5 MG/3ML) 0.083% nebulizer solution USE 1 VIAL IN NEBULIZER EVERY 6 HOURS - and as needed  ? albuterol (VENTOLIN HFA) 108 (90 Base) MCG/ACT inhaler Inhale 2 puffs into the lungs every 4 (four) hours as needed for shortness of breath.  ? blood glucose meter kit and supplies KIT Dispense based on patient and insurance preference. Use up to four times daily as directed. (FOR ICD-9 250.00, 250.01).  ? brexpiprazole (REXULTI) 2 MG TABS tablet Take 1 tablet by mouth daily.  ? budesonide-formoterol (SYMBICORT) 160-4.5 MCG/ACT inhaler Inhale 2 puffs into the lungs 2 (two) times daily.  ? canagliflozin (INVOKANA) 100 MG TABS tablet Take 1 tablet (100 mg total) by mouth daily before breakfast.  ? CARTIA XT 240 MG 24 hr capsule Take 1 capsule (240 mg total) by mouth daily.  ? cloNIDine (CATAPRES) 0.1 MG tablet TAKE ONE TABLET BY MOUTH BEFORE BREAKFAST and TAKE ONE TABLET BY MOUTH EVERYDAY AT BEDTIME  ? diclofenac sodium (VOLTAREN) 1 % GEL Apply 2 g topically daily as needed (fibromyalgia pain).  ? DULoxetine (CYMBALTA) 20 MG capsule Take 20 mg by mouth daily.  ? DULoxetine (CYMBALTA) 60 MG capsule Take 60 mg by mouth at bedtime.   ? fluticasone (FLONASE) 50 MCG/ACT nasal spray Place 2 sprays into both nostrils 2 (two) times daily.  ? furosemide (LASIX) 80 MG tablet TAKE ONE TABLET BY  MOUTH TWICE DAILY  ? gabapentin (NEURONTIN) 300 MG capsule TAKE 1 CAPSULE(300 MG) BY MOUTH TWICE DAILY  ? glucose blood (ACCU-CHEK GUIDE) test strip USE UP TO FOUR TIMES DAILY AS DIRECTED.  ? Iron, Ferrous Sulfate, 325 (65 Fe) MG TABS Take 325 mg by mouth daily.  ? levocetirizine (XYZAL) 5 MG tablet Take 1 tablet (5 mg total) by mouth at bedtime.  ? LINZESS 145 MCG CAPS capsule TAKE ONE CAPSULE BY MOUTH every other DAY  ? meclizine (ANTIVERT) 25 MG tablet Take 1 tablet (25 mg total) by mouth 3 (three) times daily as needed for dizziness.  ? metFORMIN (GLUCOPHAGE) 500 MG tablet TAKE ONE TABLET BY MOUTH EVERYDAY AT BEDTIME  ? metoCLOPramide (REGLAN) 10 MG tablet Take 1 tablet (10 mg total) by mouth every 6 (six) hours as needed for nausea (or headache).  ? omeprazole (PRILOSEC) 20 MG capsule TAKE ONE CAPSULE BY MOUTH BEFORE BREAKFAST and TAKE ONE CAPSULE BY MOUTH EVERY EVENING  ? potassium chloride (MICRO-K) 10 MEQ CR capsule TAKE TWO CAPSULES BY MOUTH EVERYDAY AT BEDTIME  ? pravastatin (PRAVACHOL) 40 MG tablet TAKE ONE TABLET BY MOUTH EVERY EVENING  ? spironolactone (ALDACTONE) 25 MG tablet Take 0.5 tablets (12.5 mg total) by mouth daily.  ? umeclidinium-vilanterol (ANORO ELLIPTA) 62.5-25 MCG/INH AEPB Inhale into the lungs.  ? Vitamin D, Ergocalciferol, (DRISDOL) 1.25 MG (50000 UNIT) CAPS capsule Take 1 capsule (50,000 Units total) by mouth every 7 (seven) days.  ? warfarin (COUMADIN) 10 MG tablet Take 1  tablet by mouth on Tue, Thurs, Sat, and Sun  ? warfarin (COUMADIN) 5 MG tablet TAKE ONE TABLET BY MOUTH EVERY M-W-F OR AS DIRECTED  ? zonisamide (ZONEGRAN) 50 MG capsule 1 TABLET AM AND 2 TABLETS PM  ? ?Facility-Administered Encounter Medications as of 06/20/2021  ?Medication  ? sodium chloride flush (NS) 0.9 % injection 3 mL  ? ?Reviewed chart for medication changes ahead of medication coordination call. ? ?No OVs, Consults, or hospital visits since last care coordination call/Pharmacist visit. (If appropriate, list  visit date, provider name) ? ?No medication changes indicated OR if recent visit, treatment plan here. ? ?BP Readings from Last 3 Encounters:  ?03/29/21 126/80  ?03/25/21 (!) 186/105  ?02/28/21 (!) 156/84  ?  ?Lab Results  ?Component Value Date  ? HGBA1C 8.4 (H) 04/21/2021  ?  ? ?Patient obtains medications through Adherence Packaging  30 Days  ?  ?Last adherence delivery included: ?Rexulti 2 mg - take 1 tablet at breakfast ?Duloxetine 30 mg  - take 3 capsules at bedtime ?Clonidine 0.1 mg - take 1 tablet before breakfast and 1 tablet at bedtime ?Warfarin 10 mg - take 1 tablet daily except  Monday, Wednesday & Friday  ?Warfarin 5 mg - take 1 tablet Monday, Wednesday and Friday ?Cartia XT 240 mg - take 1 capsule before breakfast ?Furosemide 80 mg - take 1 tablet at breakfast and 1 tablet at dinner ?Metformin 500 mg - take 1 tablet at bedtime ?Invokana 100 mg - take 1 tablet before breakfast ?Potassium CL ER 37meq -  take 2 capsules at bedtime ?Pravastatin 40 mg  - take 1 tablet at dinner ?Omeprazole - 20 mg - take 1 capsule before breakfast and 1 capsule at dinner ?Linzess 145 mcg - Take one capsule every other day ?Spironolactone 25 mg - take 1/2 tablet at breakfast ?Seroquel 50 mg - take 1 tablet at breakfast and bedtime  ?Symbicort 160/4.5 - use 2 puff twice daily ?Ferrous Sulfate 325 mg - take once daily at breakfast ?Gabapentin 300 mg - take 1 capsule with breakfast and dinner ?Albuterol HFA as needed ?Accu-chek test strips ?Accu-chek lancets ?  ?  ?Patient declined (meds) last month: No medications declined ?  ?  ?  ?Patient is due for next adherence delivery on: 06/30/2021 ?  ?Called patient and reviewed medications and coordinated delivery. ?  ?This delivery to include: ?Rexulti 2 mg - take 1 tablet at breakfast ?Duloxetine 30 mg  - take 3 capsules at bedtime ?Clonidine 0.1 mg - take 1 tablet before breakfast and 1 tablet at bedtime ?Warfarin 10 mg - take 1 tablet daily except  Monday, Wednesday & Friday   ?Warfarin 5 mg - take 1 tablet Monday, Wednesday and Friday ?Cartia XT 240 mg - take 1 capsule before breakfast ?Furosemide 80 mg - take 1 tablet at breakfast and 1 tablet at dinner ?Metformin 500 mg - take 1 tablet at bedtime ?Invokana 100 mg - take 1 tablet before breakfast ?Potassium CL ER 25meq -  take 2 capsules at bedtime ?Pravastatin 40 mg  - take 1 tablet at dinner ?Omeprazole - 20 mg - take 1 capsule before breakfast and 1 capsule at dinner ?Linzess 145 mcg - Take one capsule every other day ?Spironolactone 25 mg - take 1/2 tablet at breakfast ?Seroquel 50 mg - take 1 tablet at breakfast and bedtime  ?Symbicort 160/4.5 - use 2 puff twice daily ?Ferrous Sulfate 325 mg - take once daily at breakfast ?Gabapentin 300 mg - take  1 capsule with breakfast and dinner ?Zonisamide 50 mg  - take 1 capsule at breakfast and 2 capsules at dinner ?Albuterol HFA as needed ?Accu-chek test strips ?Accu-chek lancets ?  ?  ?Patient will need a short fill:No short fill needed ?  ?Coordinated acute fill:No acute fill needed. ?  ?Patient declined the following medications: No medications declined. ?  ?Confirmed delivery date of 06/30/2021   ?  ?Care Gaps: ?AWV - scheduled 02/24/2022 ?Last BP - 126/80 on 03/29/2021 ?Last A1C - 8.4 on 04/21/2021 ?Shingrix - never done ?Foot exam - overdue ?  ?Star Rating Drugs: ?Invokana 100 mg - last filled 05/26/2021 30 DS at [facility] ?Pravachol 40 mg - last filled 05/26/2021 30 DS at [facility] ?Metformin 500 mg - last filled 05/26/2021 30 DS at [facility] ? ?Gennie Alma CMA  ?Clinical Pharmacist Assistant ?[phone] ? ?

## 2021-06-24 ENCOUNTER — Telehealth: Payer: Self-pay | Admitting: Pharmacist

## 2021-06-24 NOTE — Chronic Care Management (AMB) (Signed)
? ? ?  Chronic Care Management ?Pharmacy Assistant  ? ?Name: [name]  MRN: [id number] DOB: January 28, 1956 ? ?06/28/2021 APPOINTMENT REMINDER ? ?Marjory Meints Danowski was reminded to have all medications, supplements and any blood glucose and blood pressure readings available for review with [name], Pharm. D, at her telephone visit on 06/28/2021 at 1:30. ? ? ?Care Gaps: ?AWV - scheduled 02/24/2022 ?Last BP - 126/80 on 03/29/2021 ?Last A1C - 8.4 on 04/21/2021 ?Shingrix - never done ?Foot exam - overdue ?  ?Star Rating Drugs: ?Invokana 100 mg - last filled 05/26/2021 30 DS at [facility] ?Pravachol 40 mg - last filled 05/26/2021 30 DS at [facility] ?Metformin 500 mg - last filled 05/26/2021 30 DS at [facility] ? ? ?Any gaps in medications fill history? No ? ?Gennie Alma CMA  ?Clinical Pharmacist Assistant ?[phone] ? ?

## 2021-06-28 ENCOUNTER — Ambulatory Visit (INDEPENDENT_AMBULATORY_CARE_PROVIDER_SITE_OTHER): Payer: Medicare Other | Admitting: Pharmacist

## 2021-06-28 DIAGNOSIS — E669 Obesity, unspecified: Secondary | ICD-10-CM

## 2021-06-28 DIAGNOSIS — I10 Essential (primary) hypertension: Secondary | ICD-10-CM

## 2021-06-28 NOTE — Patient Instructions (Signed)
Hi [name], ? ?It was great to get catch up with you again! Don't forget to go ahead and buy an arm blood pressure cuff and bring it with you to your next office visit to make sure it's accurate. ? ?Please reach out to me if you have any questions or need anything! ? ?Best, ?Maddie ? ?[name], PharmD, BCACP ?Clinical Pharmacist ?Therapist, [facility] at [facility] ?[phone] ? ? Visit Information ? ? Goals Addressed   ?None ?  ? ?Patient Care Plan: [place]  ?  ? ?Problem Identified: Problem: Hypertension, Hyperlipidemia, Diabetes, Heart Failure, COPD, and Depression   ?  ? ?Long-Range [place]: Patient-Specific [place]   ?Start Date: 10/07/2020  ?Expected End Date: 10/07/2021  ?Recent Progress: On track  ?Priority: High  ?Note:   ?Current Barriers:  ?Unable to independently monitor therapeutic efficacy ?Unable to achieve control of diabetes or cholesterol  ?Unable to maintain control of blood pressure ? ?Pharmacist Clinical Napoles(s):  ?Patient will achieve adherence to monitoring guidelines and medication adherence to achieve therapeutic efficacy ?achieve control of diabetes and cholesterol as evidenced by next A1c and lipid panel ?maintain control of blood pressure as evidenced by home and office BP readings  through collaboration with PharmD and provider.  ? ?Interventions: ?1:1 collaboration with Billie Ruddy, MD regarding development and update of comprehensive plan of care as evidenced by provider attestation and co-signature ?Inter-disciplinary care team collaboration (see longitudinal plan of care) ?Comprehensive medication review performed; medication list updated in electronic medical record ? ?Hypertension (BP [place] <130/80) ?-Uncontrolled ?-Current treatment: ?Clonidine 0.1 mg 1 tablet twice daily - Appropriate, Query effective, Safe, Accessible ?Spironolactone 25 mg 1/2 tablet daily - Appropriate, Query effective, Safe, Accessible ?Cartia XT 240 mg - Appropriate, Query effective, Safe,  Accessible ?-Medications previously tried: amlodipine (taking diltiazem), lisinopril (throat swelling) ?-Current home readings: 158/90, as high as 168 (bought a wrist cuff) ?-Current dietary habits: uses black pepper; no salt ?-Current exercise habits: not consistently ?-Denies hypotensive/hypertensive symptoms ?-Educated on BP goals and benefits of medications for prevention of heart attack, stroke and kidney damage; ?Exercise [place] of 150 minutes per week; ?Importance of home blood pressure monitoring; ?Proper BP monitoring technique; ?-Counseled to monitor BP at home twice weekly, document, and provide log at future appointments ?-Counseled on diet and exercise extensively ?Recommended to continue current medication ?-Recommended purchasing an arm blood pressure cuff ? ?Hyperlipidemia: (LDL [place] < 70) ?-Uncontrolled ?-Current treatment: ?Pravastatin 40 mg 1 tablet every evening - Appropriate, Query effective, Safe, Accessible  ?-Medications previously tried: none  ?-Current dietary patterns: chicken or pork chop for meat ?-Current exercise habits: not consistently; some PT exercises ?-Educated on Cholesterol goals;  ?Benefits of statin for ASCVD risk reduction; ?Importance of limiting foods high in cholesterol; ?Exercise [place] of 150 minutes per week; ?-Counseled on diet and exercise extensively ?Recommended increasing pravastatin to target Allie LDL ? ?Diabetes (A1c [place] <7%) ?-Uncontrolled ?-Current medications: ?Invokana 100 mg 1 tablet daily - Appropriate, Query effective, Safe, Accessible ?Metformin 500 mg 1 tablet at bedtime - Appropriate, Query effective, Safe, Accessible ?-Medications previously tried: none  ?-Current home glucose readings ?fasting glucose: 73, 112, as high as 193 or 198 ?post prandial glucose: not checking ?-Denies hypoglycemic/hyperglycemic symptoms ?-Current meal patterns:  ?breakfast: no breakfast except on Thursdays from church (eggs, toast, ham)  ?lunch: sandwich, hot dogs,  hamburgers ?dinner: beans or corn or green peas and potatoes with the TV dinners; hamburger or hot dog with bun, pork chops ?snacks: fruit; popsicles  ?drinks:  ginger ale - two a week; lots of water; no juice  ?-Current exercise: doing some walking in her hall or a block outside ?-Educated on A1c and blood sugar goals; ?Exercise [place] of 150 minutes per week; ?Benefits of routine self-monitoring of blood sugar; ?Carbohydrate counting and/or plate method ?-Counseled to check feet daily and get yearly eye exams ?-Counseled on diet and exercise extensively ?Recommended to continue current medication ?Recommended switching metformin to Ozempic for A1c lowering and weight loss. ? ?Heart Failure ([place]: manage symptoms and prevent exacerbations) ?-Controlled ?-Last ejection fraction: 60-65% (Date: 05/04/20) ?-HF type: Diastolic ?-NYHA Class: II (slight limitation of activity) ?-AHA HF Stage: C (Heart disease and symptoms present) ?-Current treatment: ?Furosemide 40 mg 1 tablet daily - Appropriate, Effective, Safe, Accessible ?Spironolactone 25 mg 1/2 tablet daily - Appropriate, Effective, Safe, Accessible ?-Medications previously tried: lisinopril ?-Current home BP/HR readings: 150-160s at home ?-Current dietary habits: limits salt intake ?-Current exercise habits: limited exercise ?-Educated on Importance of weighing daily; if you gain more than 3 pounds in one day or 5 pounds in one week, call cardiologist. ?Proper diuretic administration and potassium supplementation ?Importance of blood pressure control ?-Counseled on diet and exercise extensively ?Recommended to continue current medication ? ?COPD ([place]: control symptoms and prevent exacerbations) ?-Controlled ?-Current treatment  ?Symbicort 2 puffs twice a day - Appropriate, Query effective, Safe, Accessible ?Albuterol nebulizer as needed - Appropriate, Effective, Safe, Accessible ?Albuterol HFA as needed - Appropriate, Effective, Safe, Accessible ?-Medications  previously tried: unknown  ?-Gold Grade: Gold 2 (FEV1 50-79%) ?-Current COPD Classification:  B (high sx, <2 exacerbations/yr) ?-MMRC/CAT score: 1 ?-Pulmonary function testing: 07/2019 ?-Exacerbations requiring treatment in last 6 months: none ?-Patient reports consistent use of maintenance inhaler ?-Frequency of rescue inhaler use: rarely ?-Counseled on Proper inhaler technique; ?When to use rescue inhaler ?Differences between maintenance and rescue inhalers ?-Counseled on diet and exercise extensively ?Recommended to continue current medication ? ?Depression/Anxiety ([place]: minimize symptoms) ?-Controlled ?-Current treatment: ?Rexulti 2 mg 1 tablet daily ?Duloxetine 30 mg 3 capsules daily ?Quetiapine ?-Medications previously tried/failed: unknown ?-PHQ9: 0 ?-GAD7: 14 ?-Educated on Benefits of medication for symptom control ?Benefits of cognitive-behavioral therapy with or without medication ?-Recommended to continue current medication ? ?Pain/fibromyalgia ([place]: minimize pain) ?-Uncontrolled ?-Current treatment  ?Diclofenac gel 1% as needed - Appropriate, Query effective, Safe, Accessible ?Gabapentin 300 mg 1 capsule twice daily - Appropriate, Query effective, Safe, Accessible ?-Medications previously tried: unknown  ?-Recommended to continue current medication ? ?Allergic rhinitis ([place]: minimize symptoms) ?-Controlled ?-Current treatment  ?Flonase nasal spray as needed - Appropriate, Effective, Safe, Accessible ?Levocetirizine 5 mg 1 tablet at bedtime - Appropriate, Effective, Safe, Accessible ?-Medications previously tried: none  ?-Recommended to continue current medication ? ?Seizures ([place]: prevent seizures) ?-Controlled ?-Current treatment  ?Zonisamide 100 mg 1 capsule at breakfast and 2 capsules at bedtime - Appropriate, Effective, Safe, Accessible ?-Medications previously tried: none  ?-Recommended to continue current medication ? 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 ?GERD (Go

## 2021-06-28 NOTE — Progress Notes (Signed)
? ?Chronic Care Management ?Pharmacy Note ? ?06/28/2021 ?Name:  [name] MRN:  [id number] DOB:  Aug 02, 1955 ? ?Summary: ?LDL not at [place] < 70 ?A1c not at [place] < 7% ?BP not at [place] < 130/80 ? ?Recommendations/Changes made from today's visit: ?-Recommended replacing metformin with Ozempic for A1c lowering and weight loss ?-Consider switching Invokana 100 mg to Jardiance 25 mg due to HF indication ?-Recommend increasing pravastatin to 80 mg daily to lower LDL to target ?-Recommended purchasing an arm BP cuff ? ? ?Plan: ?Follow up DM assessment in 3-4 weeks ? ? ?Subjective: ?[name] is an 66 y.o. year old female who is a primary patient of Volanda Napoleon, Langley Adie, MD.  The CCM team was consulted for assistance with disease management and care coordination needs.   ? ?Engaged with patient by telephone for follow up visit in response to provider referral for pharmacy case management and/or care coordination services.  ? ?Consent to Services:  ?The patient was given information about Chronic Care Management services, agreed to services, and gave verbal consent prior to initiation of services.  Please see initial visit note for detailed documentation.  ? ?Patient Care Team: ?Billie Ruddy, MD as PCP - General (Family Medicine) ?Werner Lean, MD as PCP - Cardiology (Cardiology) ?Viona Gilmore, [facility] as Pharmacist (Pharmacist) ? ?Recent office visits: ?05/11/2021 [name] MD - Patient was seen for elevated total protein and additional issues. No medication changes. Follow up in 1 month. ? ?04/18/2021 [name] MD - Patient was seen for fatigue and additional issues. Recommended iron supplement 325 mg daily. Follow up in 3-4 weeks. ? ?03/07/2021 [name] MD - Patient was seen for insomnia and additional issues. No medication changes. Follow up if symptoms worsen or fail to improve. ? ?03/02/21 Rolene Arbour, LPN: Patient presented for AWV. ? ?02/02/2021 [name] MD - Patient was  seen for SOB and additional issues. Started Vit D 50,000 units 1 every 7 days. Increased Gabapentin to 300 mg twice daily. Follow up if symptoms worsen or fail to improve. ? ?Recent consult visits: ?06/09/2021 [name] MD (Neurology - [facility]) - Patient was seen for Stroke like episode. No medication changes. No follow up noted.  ?  ?03/29/2021 Marshell Garfinkel MD (pulmonary) - Patient was seen for moderate persistent asthma without complication and additional issues. No medication changes. Follow up in 6 months. ?  ?03/25/2021 [name] MD (cardiology) - Patient was seen for Obesity, diabetes, and hypertension syndrome and additional issues. No follow up noted. ?  ?02/21/2021 [name] MD (cardiology) - Patient was seen for acute on chronic heart failure with preserved ejection fraction and an additional issue. Discontinued Covid 19 vaccine, hydrochlorothiazide 12.5 mg and Pneumococcal vaccine. Follow up At [facility]. ? ?02/07/2021 Gwyndolyn Kaufman MD (cardiology) - Patient was seen for Acute on chronic heart failure with preserved ejection fraction and additional issues. No medication changes. Follow up in 2 weeks. ?  ?02/07/2021 Deloria Lair MD (Opthamology) - Patient was seen for vitreomacular adhesions of both eyes. No medication changes. Follow up in 1 year. ?  ?02/03/2021 [name] MD (cardiology) - Patient was seen for Acute on chronic heart failure with preserved ejection fraction and additional issues. Started Spironolactone 12.5 mg daily, Increased Furosemide to 80 mg twice daily, decreased Zonisamide to 50 mg 1 in the AM and 2 in the PM. Discontinued Loratadine and Tramadol. Follow up in 4 days. ? ? ?Hospital visits: ?Patient was seen at [facility] ED due to tiredness and sleep trouble. Discharged on 02/19/2021 after 18 hours at ED. ?  ?New?Medications Started at [facility] Discharge:?? ?No medication started ?  ?Medication Changes at  Hospital Discharge: ?No medication changes ?  ?Medications Discontinued at Hospital Discharge: ?No medications stopped. ?  ?Medications that remain the same after Hospital Discharge:??  ?-All other medications will remain the same.   ? ?Objective: ? ?Lab Results  ?Component Value Date  ? CREATININE 0.87 04/21/2021  ? BUN 17 04/21/2021  ? GFR 69.69 04/21/2021  ? GFRNONAA >60 02/18/2021  ? GFRAA 94 03/05/2020  ? NA 139 04/21/2021  ? K 3.1 (L) 04/21/2021  ? CALCIUM 9.3 04/21/2021  ? CO2 28 04/21/2021  ? GLUCOSE 164 (H) 04/21/2021  ? ? ?Lab Results  ?Component Value Date/Time  ? HGBA1C 8.4 (H) 04/21/2021 10:42 AM  ? HGBA1C 8.0 (H) 02/02/2021 02:29 PM  ? GFR 69.69 04/21/2021 10:42 AM  ? GFR 78.36 02/02/2021 02:29 PM  ? MICROALBUR 12.5 (H) 08/11/2020 11:16 AM  ?  ?Last diabetic Eye exam:  ?Lab Results  ?Component Value Date/Time  ? HMDIABEYEEXA No Retinopathy 11/24/2020 12:00 AM  ?  ?Last diabetic Foot exam: No results found for: HMDIABFOOTEX  ? ?Lab Results  ?Component Value Date  ? CHOL 194 02/02/2020  ? HDL 76 02/02/2020  ? [place] 89 02/02/2020  ? TRIG 196 (H) 02/02/2020  ? CHOLHDL 2.6 02/02/2020  ? ? ? ?  Latest Ref Rng & Units 04/21/2021  ? 10:42 AM 02/02/2021  ?  2:29 PM 08/18/2020  ?  4:27 PM  ?Hepatic Function  ?Total Protein 6.0 - 8.3 g/dL 8.5   8.1   7.7    ?Albumin 3.5 - 5.2 g/dL 4.3   4.3   3.6    ?AST 0 - 37 U/L _0 ?ALT 0 - 35 U/L _1 ?Alk Phosphatase 39 - 117 U/L 80   84   80    ?Total Bilirubin 0.2 - 1.2 mg/dL 0.3   0.4   0.4    ? ? ?Lab Results  ?Component Value Date/Time  ? TSH 5.15 04/21/2021 10:42 AM  ? TSH 4.23 06/07/2020 10:00 AM  ? FREET4 0.62 04/21/2021 10:42 AM  ? FREET4 0.59 (L) 06/07/2020 10:00 AM  ? ? ? ?  Latest Ref Rng & Units 04/21/2021  ? 10:42 AM 02/28/2021  ?  8:00 AM 02/21/2021  ?  3:32 PM  ?CBC  ?WBC 4.0 - 10.5 K/uL 5.7    7.2    ?Hemoglobin 12.0 - 15.0 g/dL 12.7   12.6   12.0    ?Hematocrit 36.0 - 46.0 % 39.4   37.0   36.6    ?Platelets 150.0 - 400.0 K/uL 316.0    323     ? ? ?Lab Results  ?Component Value Date/Time  ? VD25OH 31.30 04/21/2021 10:42 AM  ? VD25OH 18.14 (L) 02/02/2021 02:29 PM  ? ? ?Clinical ASCVD: No  ?The 10-year ASCVD risk score (Arnett DK, et al., 2019) is: 37.3% ?  Values used to calculate the score: ?    Age: 89 years ?    Sex: Female ?    Is Non-Hispanic African American: Yes ?    Diabetic: Yes ?    Tobacco smoker: No ?    Systolic Blood Pressure: 102 mmHg ?    Is BP treated: Yes ?  HDL Cholesterol: 76 mg/dL ?    Total Cholesterol: 194 mg/dL   ? ? ?  03/02/2021  ?  3:25 PM 11/03/2020  ?  8:22 AM 03/05/2020  ? 11:09 AM  ?Depression screen PHQ 2/9  ?Decreased Interest 1 0 0  ?Down, Depressed, Hopeless 1 0 0  ?PHQ - 2 Score 2 0 0  ?Altered sleeping 1  0  ?Tired, decreased energy 1  0  ?Change in appetite 0  0  ?Feeling bad or failure about yourself  0  0  ?Trouble concentrating 0  0  ?Moving slowly or fidgety/restless 0  0  ?Suicidal thoughts 0  0  ?PHQ-9 Score 4  0  ?Difficult doing work/chores Somewhat difficult  Not difficult at all  ?  ?mMRC Dyspnea Scale mMRC Score  ?03/29/2021 ?10:45 AM 0  ?08/12/2019 ? 9:21 AM 1  ? ? ?  08/12/2019  ?  9:19 AM  ?CAT Score  ?Total CAT Score 21  ? ? ? ?Social History  ? ?Tobacco Use  ?Smoking Status Never  ?Smokeless Tobacco Never  ? ?BP Readings from Last 3 Encounters:  ?03/29/21 126/80  ?03/25/21 (!) 186/105  ?02/28/21 (!) 156/84  ? ?Pulse Readings from Last 3 Encounters:  ?03/29/21 60  ?03/25/21 82  ?02/28/21 74  ? ?Wt Readings from Last 3 Encounters:  ?03/29/21 291 lb 9.6 oz (132.3 kg)  ?03/25/21 293 lb (132.9 kg)  ?03/02/21 288 lb (130.6 kg)  ? ?BMI Readings from Last 3 Encounters:  ?03/29/21 47.07 kg/m?  ?03/25/21 47.29 kg/m?  ?03/02/21 56.25 kg/m?  ? ? ?Assessment/Interventions: Review of patient past medical history, allergies, medications, health status, including review of consultants reports, laboratory and other test data, was performed as part of comprehensive evaluation and provision of chronic care management  services.  ? ?SDOH:  (Social Determinants of Health) assessments and interventions performed: Yes ? ? ?SDOH Screenings  ? ?Alcohol Screen: Not on file  ?Depression (PHQ2-9): Low Risk   ? PHQ-2 Score: 4  ?Fin

## 2021-06-29 DIAGNOSIS — I6523 Occlusion and stenosis of bilateral carotid arteries: Secondary | ICD-10-CM | POA: Diagnosis not present

## 2021-07-04 ENCOUNTER — Other Ambulatory Visit: Payer: Self-pay | Admitting: Family Medicine

## 2021-07-06 ENCOUNTER — Other Ambulatory Visit: Payer: Self-pay

## 2021-07-06 DIAGNOSIS — Z7901 Long term (current) use of anticoagulants: Secondary | ICD-10-CM

## 2021-07-13 DIAGNOSIS — G4733 Obstructive sleep apnea (adult) (pediatric): Secondary | ICD-10-CM | POA: Diagnosis not present

## 2021-07-14 ENCOUNTER — Encounter: Payer: Self-pay | Admitting: Oncology

## 2021-07-14 ENCOUNTER — Inpatient Hospital Stay: Payer: Medicare Other

## 2021-07-14 ENCOUNTER — Inpatient Hospital Stay: Payer: Medicare Other | Attending: Oncology | Admitting: Oncology

## 2021-07-14 VITALS — BP 175/90 | HR 88 | Temp 98.1°F | Resp 18 | Ht 66.0 in | Wt 287.0 lb

## 2021-07-14 DIAGNOSIS — Z86718 Personal history of other venous thrombosis and embolism: Secondary | ICD-10-CM | POA: Insufficient documentation

## 2021-07-14 DIAGNOSIS — Z7901 Long term (current) use of anticoagulants: Secondary | ICD-10-CM | POA: Diagnosis not present

## 2021-07-14 LAB — PROTIME-INR
INR: 1.4 — ABNORMAL HIGH (ref 0.8–1.2)
Prothrombin Time: 16.7 seconds — ABNORMAL HIGH (ref 11.4–15.2)

## 2021-07-14 NOTE — Progress Notes (Signed)
?  [facility] ?OFFICE PROGRESS NOTE ? ? ?Diagnosis: Chronic anticoagulation ? ?INTERVAL HISTORY:  ? ?Ms. Gold returns as scheduled.  She continues Coumadin as prescribed.  No bleeding or symptom of thrombosis.  She reports malaise until the Rexulti dose was decreased. ? ?Objective: ? ?Vital signs in last 24 hours: ? ?Blood pressure (!) 175/90, pulse 88, temperature 98.1 ?F (36.7 ?C), resp. rate 18, height '5\' 6"'$  (1.676 m), weight 287 lb (130.2 kg), SpO2 95 %. ?  ? ?Resp: Lungs clear bilaterally ?Cardio: Regular rate and rhythm ?GI: Nontender, no hepatosplenomegaly ?Vascular: No leg edema, the left lower leg is larger than the right side ? ? ?Lab Results: ? ?Lab Results  ?Component Value Date  ? WBC 5.7 04/21/2021  ? HGB 12.7 04/21/2021  ? HCT 39.4 04/21/2021  ? MCV 83.0 04/21/2021  ? PLT 316.0 04/21/2021  ? NEUTROABS 3.2 04/21/2021  ? ? ?CMP  ?Lab Results  ?Component Value Date  ? NA 139 04/21/2021  ? K 3.1 (L) 04/21/2021  ? CL 101 04/21/2021  ? CO2 28 04/21/2021  ? GLUCOSE 164 (H) 04/21/2021  ? BUN 17 04/21/2021  ? CREATININE 0.87 04/21/2021  ? CALCIUM 9.3 04/21/2021  ? PROT 8.5 (H) 04/21/2021  ? ALBUMIN 4.3 04/21/2021  ? AST 19 04/21/2021  ? ALT 20 04/21/2021  ? ALKPHOS 80 04/21/2021  ? BILITOT 0.3 04/21/2021  ? GFRNONAA >60 02/18/2021  ? GFRAA 94 03/05/2020  ? ? ?Lab Results  ?Component Value Date  ? INR 1.4 (H) 07/14/2021  ? LABPROT 16.7 (H) 07/14/2021  ? ? ?Medications: I have reviewed the patient's current medications. ? ? ?Assessment/Plan: ?History of bilateral lower extremity deep vein thrombosis. She continues Coumadin anticoagulation. ?History of a positive lupus anticoagulant. ?History of a positive beta-2-glycoprotein IgA antibody. ?Chronic bilateral leg pain - negative bilateral Doppler January 2010. ?History of hypertension. ?COPD ?Status post hysterectomy. ?Remote history of cocaine abuse. ?History of an adrenal lesion on a CT scan in 2008 - status post a repeat CT on June 16, 2009,  showing slow enlargement of the right adrenal nodule since 2006, most consistent with an adenoma. Stable on a CT 04/24/2014 ?History of rectal and vaginal bleeding in May 2009.   ?Status post carpal tunnel surgery. 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 Right knee replacement September 2011. ?Multiple orthopedic conditions. ?Placement of IVC filter preoperatively before knee replacement. The IVC filter was retrieved 03/01/2011. ?Anemia. Ferritin in normal range at 82 on 08/18/2013; 61 on 07/10/2016 ?Status post lumbar fusion surgery October 2013 ?Right hydronephrosis-evaluated by Dr. Tresa Moore ?Left ankle swelling/pain 07/24/2016 ?Mild anemia noted on hospital admission 2018 ?Colonoscopy 04/19/2017-entire examined colon normal.  Repeat colonoscopy in 10 years for surveillance. ?Admission with COVID-19 infection 03/09/2019 ?  ? ? ? ? ?Disposition: ?Ms. Whilden has a history of venous thrombosis.  She is maintained on chronic Coumadin anticoagulation.  She reports being compliant with the current Coumadin regimen.  The PT/INR has been subtherapeutic for the past several months.  We increase the Coumadin dose to 10 mg daily.  She will call for bleeding.  She will return for a PT/INR in 3 weeks.  She will be scheduled for an office visit in 3 months. ? ?[name], MD ? ?07/14/2021  ?8:47 AM ? ? ?

## 2021-07-17 DIAGNOSIS — E1169 Type 2 diabetes mellitus with other specified complication: Secondary | ICD-10-CM

## 2021-07-17 DIAGNOSIS — I10 Essential (primary) hypertension: Secondary | ICD-10-CM

## 2021-07-17 DIAGNOSIS — E669 Obesity, unspecified: Secondary | ICD-10-CM | POA: Diagnosis not present

## 2021-07-19 ENCOUNTER — Other Ambulatory Visit: Payer: Self-pay | Admitting: Family Medicine

## 2021-07-19 ENCOUNTER — Other Ambulatory Visit: Payer: Self-pay | Admitting: Oncology

## 2021-07-19 DIAGNOSIS — E611 Iron deficiency: Secondary | ICD-10-CM

## 2021-07-19 DIAGNOSIS — E876 Hypokalemia: Secondary | ICD-10-CM

## 2021-07-20 ENCOUNTER — Telehealth: Payer: Self-pay | Admitting: Pharmacist

## 2021-07-20 NOTE — Chronic Care Management (AMB) (Signed)
Chronic Care Management Pharmacy Assistant   Name: [name]  MRN: [id number] DOB: May 28, 1955  Reason for Encounter: Disease State and Medication Review / Medication Coordination Call and Diabetes Assessment Call   Conditions to be addressed/monitored: HTN  Recent office visits:  None  Recent consult visits:  07/14/2021 Colleen Coder MD (oncology) - Patient was seen for chronic anticoagulation. No medication changes. No follow up noted.   06/29/2021 Colleen Friendly MD (cardiac ultrasound) - Patient was seen for stroke like episode. No medication changes. No follow up noted.   Hospital visits:  None  Medications: Outpatient Encounter Medications as of 07/20/2021  Medication Sig   Accu-Chek Softclix Lancets lancets USE UP TO FOUR TIMES DAILY AS DIRECTED.   albuterol (PROVENTIL) (2.5 MG/3ML) 0.083% nebulizer solution USE 1 VIAL IN NEBULIZER EVERY 6 HOURS - and as needed   albuterol (VENTOLIN HFA) 108 (90 Base) MCG/ACT inhaler Inhale 2 puffs into the lungs every 4 (four) hours as needed for shortness of breath.   blood glucose meter kit and supplies KIT Dispense based on patient and insurance preference. Use up to four times daily as directed. (FOR ICD-9 250.00, 250.01).   brexpiprazole (REXULTI) 2 MG TABS tablet Take 1 tablet by mouth daily.   budesonide-formoterol (SYMBICORT) 160-4.5 MCG/ACT inhaler Inhale 2 puffs into the lungs 2 (two) times daily.   canagliflozin (INVOKANA) 100 MG TABS tablet Take 1 tablet (100 mg total) by mouth daily before breakfast.   CARTIA XT 240 MG 24 hr capsule Take 1 capsule (240 mg total) by mouth daily.   cloNIDine (CATAPRES) 0.1 MG tablet TAKE ONE TABLET BY MOUTH BEFORE BREAKFAST and TAKE ONE TABLET BY MOUTH EVERYDAY AT BEDTIME   diclofenac sodium (VOLTAREN) 1 % GEL Apply 2 g topically daily as needed (fibromyalgia pain).   DULoxetine (CYMBALTA) 20 MG capsule Take 20 mg by mouth daily.   DULoxetine (CYMBALTA) 60 MG capsule Take 60 mg by  mouth at bedtime.    FEROSUL 325 (65 Fe) MG tablet TAKE ONE TABLET BY MOUTH ONCE DAILY   fluticasone (FLONASE) 50 MCG/ACT nasal spray Place 2 sprays into both nostrils 2 (two) times daily.   furosemide (LASIX) 80 MG tablet TAKE ONE TABLET BY MOUTH TWICE DAILY   gabapentin (NEURONTIN) 300 MG capsule TAKE 1 CAPSULE(300 MG) BY MOUTH TWICE DAILY   glucose blood (ACCU-CHEK GUIDE) test strip USE TO check blood glucose UP TO four times daily AS DIRECTED   levocetirizine (XYZAL) 5 MG tablet Take 1 tablet (5 mg total) by mouth at bedtime.   LINZESS 145 MCG CAPS capsule TAKE ONE CAPSULE BY MOUTH every other DAY   meclizine (ANTIVERT) 25 MG tablet Take 1 tablet (25 mg total) by mouth 3 (three) times daily as needed for dizziness.   metFORMIN (GLUCOPHAGE) 500 MG tablet TAKE ONE TABLET BY MOUTH EVERYDAY AT BEDTIME   metoCLOPramide (REGLAN) 10 MG tablet Take 1 tablet (10 mg total) by mouth every 6 (six) hours as needed for nausea (or headache).   omeprazole (PRILOSEC) 20 MG capsule TAKE ONE CAPSULE BY MOUTH BEFORE BREAKFAST and TAKE ONE CAPSULE BY MOUTH EVERY EVENING   potassium chloride (MICRO-K) 10 MEQ CR capsule TAKE TWO CAPSULES BY MOUTH EVERYDAY AT BEDTIME   pravastatin (PRAVACHOL) 40 MG tablet TAKE ONE TABLET BY MOUTH EVERY EVENING   spironolactone (ALDACTONE) 25 MG tablet Take 0.5 tablets (12.5 mg total) by mouth daily.   umeclidinium-vilanterol (ANORO ELLIPTA) 62.5-25 MCG/INH AEPB Inhale into the lungs.   Vitamin D,  Ergocalciferol, (DRISDOL) 1.25 MG (50000 UNIT) CAPS capsule Take 1 capsule (50,000 Units total) by mouth every 7 (seven) days.   warfarin (COUMADIN) 10 MG tablet TAKE ONE TABLET BY MOUTH Daily. As 07/14/21   warfarin (COUMADIN) 5 MG tablet TAKE ONE TABLET BY MOUTH EVERY M-W-F OR AS DIRECTED (Patient not taking: Reported on 07/14/2021)   zonisamide (ZONEGRAN) 50 MG capsule 1 TABLET AM AND 2 TABLETS PM   Facility-Administered Encounter Medications as of 07/20/2021  Medication   sodium chloride  flush (NS) 0.9 % injection 3 mL  Recent Relevant Labs: Lab Results  Component Value Date/Time   HGBA1C 8.4 (H) 04/21/2021 10:42 AM   HGBA1C 8.0 (H) 02/02/2021 02:29 PM   MICROALBUR 12.5 (H) 08/11/2020 11:16 AM    Kidney Function Lab Results  Component Value Date/Time   CREATININE 0.87 04/21/2021 10:42 AM   CREATININE 0.73 02/21/2021 03:32 PM   CREATININE 0.81 02/02/2020 11:26 AM   GFR 69.69 04/21/2021 10:42 AM   GFRNONAA >60 02/18/2021 01:37 PM   GFRNONAA 77 02/02/2020 11:26 AM   GFRAA 94 03/05/2020 11:55 AM   GFRAA 89 02/02/2020 11:26 AM    BP Readings from Last 3 Encounters:  07/14/21 (!) 175/90  03/29/21 126/80  03/25/21 (!) 186/105    Lab Results  Component Value Date   HGBA1C 8.4 (H) 04/21/2021    Current antihyperglycemic regimen:  Invokana 100 mg 1 tablet daily Metformin 500 mg 1 tablet at bedtime   What recent interventions/DTPs have been made to improve glycemic control:  No recent interventions  Have there been any recent hospitalizations or ED visits since last visit with CPP? No recent hospital visits.   Patient denies hypoglycemic symptoms, including None  Patient denies hyperglycemic symptoms, including none  How often are you checking your blood sugar? Patient is checking her blood sugars infrequently (when she feels like it ).  She was encouraged to check blood sugars daily and keep a log with date and before or after eating. She agreed she will try and do better.   What are your blood sugars ranging?  After meals: Patient checked her blood sugar last week and her reading was 178, she states this was sometime after she had eaten.  During the week, how often does your blood glucose drop below 70? Patient states she doesn't have any readings or symptoms of her blood sugars dropping below 70.   Are you checking your feet daily/regularly? Patient states she checks her feet daily.   Adherence Review: Is the patient currently on a STATIN medication?  Yes Is the patient currently on ACE/ARB medication? No Does the patient have >5 day gap between last estimated fill dates? No   Patient obtains medications through Adherence Packaging  30 Days    Last adherence delivery included: Rexulti 2 mg - take 1 tablet at breakfast Duloxetine 30 mg  - take 3 capsules at bedtime Clonidine 0.1 mg - take 1 tablet before breakfast and 1 tablet at bedtime Warfarin 10 mg - take 1 tablet daily except  Monday, Wednesday & Friday  Warfarin 5 mg - take 1 tablet Monday, Wednesday and Friday Cartia XT 240 mg - take 1 capsule before breakfast Furosemide 80 mg - take 1 tablet at breakfast and 1 tablet at dinner Metformin 500 mg - take 1 tablet at bedtime Invokana 100 mg - take 1 tablet before breakfast Potassium CL ER 42mq -  take 2 capsules at bedtime Pravastatin 40 mg  - take 1 tablet at  dinner Omeprazole - 20 mg - take 1 capsule before breakfast and 1 capsule at dinner Linzess 145 mcg - Take one capsule every other day Spironolactone 25 mg - take 1/2 tablet at breakfast Seroquel 50 mg - take 1 tablet at breakfast and bedtime  Symbicort 160/4.5 - use 2 puff twice daily Ferrous Sulfate 325 mg - take once daily at breakfast Gabapentin 300 mg - take 1 capsule with breakfast and dinner Zonisamide 50 mg  - take 1 capsule at breakfast and 2 capsules at dinner Albuterol HFA as needed Accu-chek test strips Accu-chek lancets   Patient declined (meds) last month: No medications declined       Patient is due for next adherence delivery on: 08/01/2021   Called patient and reviewed medications and coordinated delivery.   This delivery to include: Rexulti 2 mg - take 1 tablet at breakfast Duloxetine 30 mg  - take 3 capsules at bedtime (verified with patient) Clonidine 0.1 mg - take 1 tablet before breakfast and 1 tablet at bedtime Warfarin 10 mg - take 1 tablet daily or as directed Cartia XT 240 mg - take 1 capsule before breakfast Furosemide 80 mg - take 1  tablet at breakfast and 1 tablet at dinner Metformin 500 mg - take 1 tablet at bedtime Invokana 100 mg - take 1 tablet before breakfast Potassium CL ER 36mq -  take 2 capsules at bedtime Pravastatin 40 mg  - take 1 tablet at dinner Omeprazole - 20 mg - take 1 capsule before breakfast and 1 capsule at dinner Linzess 145 mcg - Take one capsule every other day Spironolactone 25 mg - take 1/2 tablet at breakfast Quetiapine 50 mg - take 1 tablet at breakfast and bedtime  Symbicort 160/4.5 - use 2 puff twice daily Gabapentin 300 mg - take 1 capsule with breakfast and dinner Zonisamide 50 mg  - take 1 capsule at breakfast and 2 capsules at dinner Albuterol HFA 2 puffs every 4 hours as needed Accu-chek test strips Accu-chek softclix lancets     Patient will need a short fill:No short fill needed   Coordinated acute fill:No acute fill needed.   Patient declined the following medications:  Ferrous Sulfate 325 mg patient picked this up OTC and has plenty on hand.   Confirmed delivery date of 08/01/2021, advised patient that pharmacy will contact them the morning of delivery.   Care Gaps: AWV - scheduled 02/24/2022 Last BP - 175/90 on 07/14/2021 Last A1C - 8.4 on 04/21/2021 Shingrix - never done Foot exam - overdue TDAP - postponed Pneumonia Vaccine - postponed Hep C screen - postponed   Star Rating Drugs: Invokana 100 mg - last filled 06/24/2021 30 DS at [facility] Pravachol 40 mg - last filled 06/24/2021 30 DS at [facility] Metformin 500 mg - last filled 06/24/2021 30 [id number]

## 2021-07-25 DIAGNOSIS — D3500 Benign neoplasm of unspecified adrenal gland: Secondary | ICD-10-CM | POA: Diagnosis not present

## 2021-07-25 DIAGNOSIS — R310 Gross hematuria: Secondary | ICD-10-CM | POA: Diagnosis not present

## 2021-07-25 DIAGNOSIS — N1330 Unspecified hydronephrosis: Secondary | ICD-10-CM | POA: Diagnosis not present

## 2021-07-25 DIAGNOSIS — M545 Low back pain, unspecified: Secondary | ICD-10-CM | POA: Diagnosis not present

## 2021-08-04 ENCOUNTER — Telehealth: Payer: Medicare Other | Admitting: Family Medicine

## 2021-08-04 ENCOUNTER — Inpatient Hospital Stay: Payer: Medicare Other | Attending: Oncology

## 2021-08-04 NOTE — Progress Notes (Signed)
Call patient for phone appointment however she did not answer.  VM left.  [name], MD

## 2021-08-11 ENCOUNTER — Ambulatory Visit (INDEPENDENT_AMBULATORY_CARE_PROVIDER_SITE_OTHER): Payer: Medicare Other | Admitting: Family Medicine

## 2021-08-11 ENCOUNTER — Other Ambulatory Visit: Payer: Self-pay

## 2021-08-11 VITALS — BP 136/90 | HR 82 | Temp 98.7°F | Wt 289.2 lb

## 2021-08-11 DIAGNOSIS — J029 Acute pharyngitis, unspecified: Secondary | ICD-10-CM | POA: Diagnosis not present

## 2021-08-11 DIAGNOSIS — B370 Candidal stomatitis: Secondary | ICD-10-CM

## 2021-08-11 DIAGNOSIS — R059 Cough, unspecified: Secondary | ICD-10-CM | POA: Diagnosis not present

## 2021-08-11 DIAGNOSIS — H6123 Impacted cerumen, bilateral: Secondary | ICD-10-CM

## 2021-08-11 DIAGNOSIS — R0982 Postnasal drip: Secondary | ICD-10-CM

## 2021-08-11 LAB — POCT RAPID STREP A (OFFICE): Rapid Strep A Screen: NEGATIVE

## 2021-08-11 LAB — POC COVID19 BINAXNOW: SARS Coronavirus 2 Ag: NEGATIVE

## 2021-08-11 MED ORDER — MAGIC MOUTHWASH: 5.0000 mL | Freq: Four times a day (QID) | ORAL | 0 refills | Status: DC | PRN

## 2021-08-11 MED ORDER — FEXOFENADINE HCL 180 MG PO TABS: 180.0000 mg | ORAL_TABLET | Freq: Every day | ORAL | 1 refills | Status: DC

## 2021-08-11 MED ORDER — ALBUTEROL SULFATE (2.5 MG/3ML) 0.083% IN NEBU: INHALATION_SOLUTION | RESPIRATORY_TRACT | 11 refills | Status: DC

## 2021-08-11 NOTE — Progress Notes (Signed)
Subjective:    Patient ID: [name], female    DOB: 01-05-1956, 66 y.o.   MRN: [id number]  Chief Complaint  Patient presents with   Cough    Cough is causing throat to hurt, has to keep gargling. When eats or drinks anything, states her throat burns. Not sure if it is thrush, last time was given magic mouthwash      HPI Patient was seen today for ongoing concern.  Pt with productive cough worse at night and sore throat x 3 wks.  States tongue is "slick" with a white coating and sore with eating or drinking soda.  In the past when this happened had to use a mouth wash.  Pt denies fever, chills, n/v, facial pain/pressure, ear pain/pressure.  Past Medical History:  Diagnosis Date   Adrenal mass ([place]) 03/226   Benign   Arthritis    knees/multiple orthopedic conditons; lower back   Asthma    per pt   Clotting disorder (HCC)    +beta-2-glycoprotein IgA antibody   COPD (chronic obstructive pulmonary disease) (HCC)    inhalers dependent on environment   Depression    Diabetes mellitus    120s usually fasting -  dx more than 10 yrs ago   Dizziness, nonspecific    DVT (deep venous thrombosis) (HCC)    Recurrent   Fibromyalgia    GERD (gastroesophageal reflux disease)    Heart murmur    History of blood transfusion    History of cocaine abuse ([place])    Remote history    Hyperlipidemia    Hypertension    takes meds daily   Hypothyroidism    Lupus Anticoagulant Positive    On home oxygen therapy    at night   Shortness of breath    exertion or lying flat   Sleep apnea    2l of oxygen at night (as of 12/6, she used to)    Allergies  Allergen Reactions   Lisinopril Swelling    THROAT SWELLING Pt states her throat started to "close up" after being on it for "so long."   Corticosteroids Rash    States she developed a rash on her tongue after a steroid injection in her bac    ROS General: Denies fever, chills, night sweats, changes in weight, changes in  appetite HEENT: Denies headaches, ear pain, changes in vision, rhinorrhea, sore throat  +coating on tongue, sore throat CV: Denies CP, palpitations, SOB, orthopnea Pulm: Denies SOB, wheezing + cough GI: Denies abdominal pain, nausea, vomiting, diarrhea, constipation GU: Denies dysuria, hematuria, frequency, vaginal discharge Msk: Denies muscle cramps, joint pains Neuro: Denies weakness, numbness, tingling Skin: Denies rashes, bruising Psych: Denies depression, anxiety, hallucinations    Objective:    Blood pressure 136/90, pulse 82, temperature 98.7 F (37.1 C), temperature source Oral, weight 289 lb 3.2 oz (131.2 kg), SpO2 96 %.  Gen. Pleasant, well-nourished, in no distress, normal affect   HEENT: [place]/AT, face symmetric, conjunctiva clear, no scleral icterus, PERRLA, EOMI, nares patent without drainage, pharynx with postnasal drainage, mild erythema, no exudate.  Thrush present with erythema of tongue.  Bilateral canals partially occluded with cerumen.  TMs full bilaterally.  Mild cervical lymphadenopathy. Lungs: Cough, no accessory muscle use, CTAB, no wheezes or rales Cardiovascular: RRR, no m/r/g, no peripheral edema Musculoskeletal: No deformities, no cyanosis or clubbing, normal tone Neuro:  A&Ox3, CN II-XII intact, normal gait Skin:  Warm, no lesions/ rash   Wt Readings from Last 3 Encounters:  08/11/21 289 lb 3.2 oz (131.2 kg)  07/14/21 287 lb (130.2 kg)  03/29/21 291 lb 9.6 oz (132.3 kg)    Lab Results  Component Value Date   WBC 5.7 04/21/2021   HGB 12.7 04/21/2021   HCT 39.4 04/21/2021   PLT 316.0 04/21/2021   GLUCOSE 164 (H) 04/21/2021   CHOL 194 02/02/2020   TRIG 196 (H) 02/02/2020   HDL 76 02/02/2020   LDLCALC 89 02/02/2020   ALT 20 04/21/2021   AST 19 04/21/2021   NA 139 04/21/2021   K 3.1 (L) 04/21/2021   CL 101 04/21/2021   CREATININE 0.87 04/21/2021   BUN 17 04/21/2021   CO2 28 04/21/2021   TSH 5.15 04/21/2021   INR 1.4 (H) 07/14/2021   HGBA1C 8.4  (H) 04/21/2021   MICROALBUR 12.5 (H) 08/11/2020    Assessment/Plan:  Sore throat -New problem -Rapid strep negative -Symptoms likely 2/2 thrush and postnasal drainage -Continue supportive care including gargling with warm salt water Chloraseptic spray -Given Rx for Magic mouthwash -Given caution - Plan: POC Rapid Strep A  Cough, unspecified type -New problem -COVID testing negative -Cough likely 2/2 postnasal drainage caused by seasonal allergies.  Also consider throat irritation from thrush causing cough -Supportive care with OTC cough/cold medications/antihistamines, drinking warm fluids, rest, etc. - Plan: POC COVID-19  Bilateral impacted cerumen -OTC Debrox eardrops  Thrush -New problem -Patient with allergy to corticosteroids.  Because rash on tongue in the past. -Rx for Magic mouthwash given without steroids - Plan: magic mouthwash SOLN  Post-nasal drainage  -Discussed supportive care with OTC antihistamine, nasal saline rinse, Flonase, etc. - Plan: fexofenadine (ALLEGRA) 180 MG tablet  F/u as needed for continued or worsening symptoms  [name], MD

## 2021-08-11 NOTE — Patient Instructions (Addendum)
A prescription for Allegra, an allergy pill to help with the cough, drainage, and sore throat was sent to your  pharmacy.  The prescription for Magic mouthwash was printed out.

## 2021-08-12 DIAGNOSIS — J449 Chronic obstructive pulmonary disease, unspecified: Secondary | ICD-10-CM | POA: Diagnosis not present

## 2021-08-12 IMAGING — MR MR HEAD W/O CM
9 of 10 series · 41 of 48 positions shown · non-contrast
Comparison: Head CT 03/04/2018.  MRI 08/12/2014.

CLINICAL DATA: Unexplained altered level of consciousness.
Right-sided weakness over the last 3 weeks.

EXAM:
MRI HEAD WITHOUT CONTRAST
TECHNIQUE: Multiplanar, multiecho pulse sequences of the brain and surrounding
structures were obtained without intravenous contrast.

[Series 3: T1 · sagittal · 5.0mm · 0.47mm/px · 2 of 19 slices shown (1 of 2)]
[im 1/19]
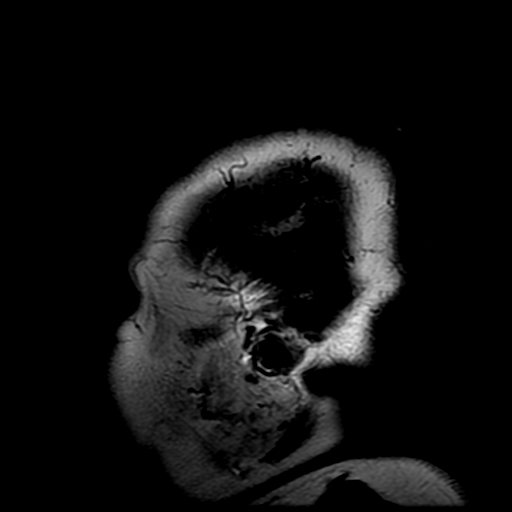
[im 19/19]
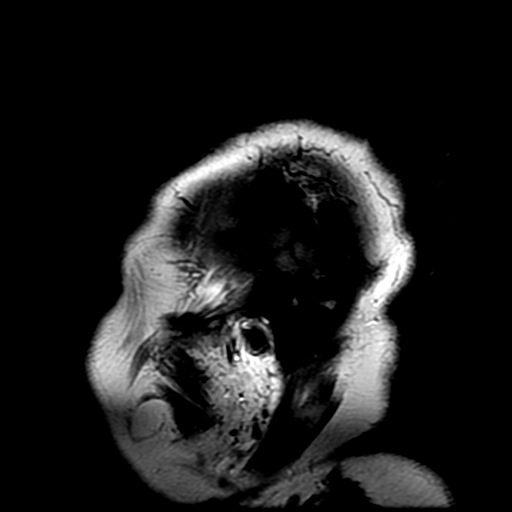

[Series 4: DWI · axial · 3.0mm · 1.09mm/px · z∈[-74,+61]mm · 9 of 92 slices shown (1 of 4)]
[im 1/92]
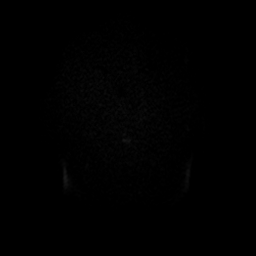
[im 12/92]
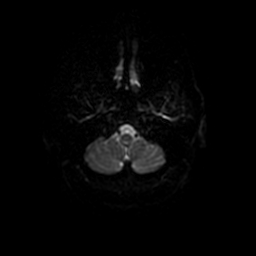
[im 23/92]
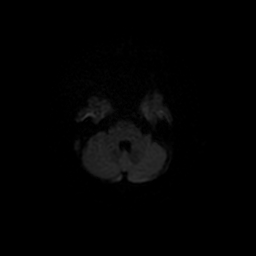
[im 35/92]
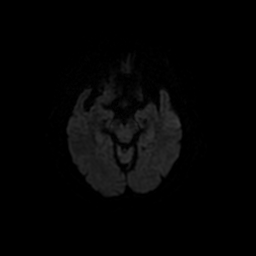
[im 46/92]
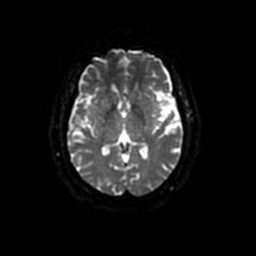
[im 57/92]
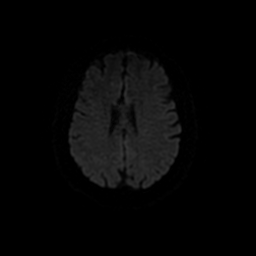
[im 69/92]
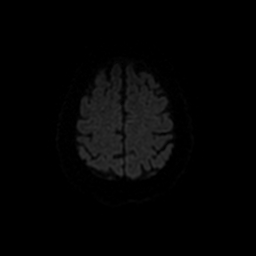
[im 80/92]
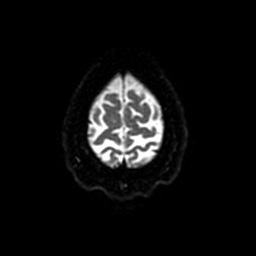
[im 92/92]
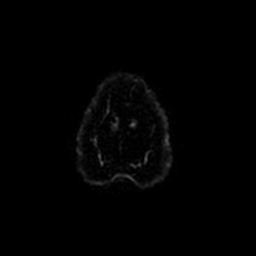

[Series 5: T2 · axial · 5.0mm · 0.86mm/px · z∈[-71,+62]mm · 2 of 20 slices shown (1 of 2)]
[im 1/20]
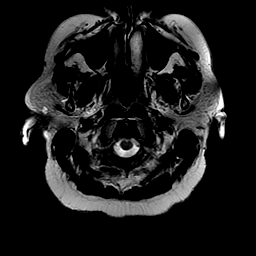
[im 20/20]
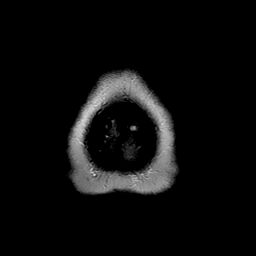

[Series 6: FLAIR · axial · 3.0mm · 0.86mm/px · z∈[-74,+64]mm · 2 of 24 slices shown]
[im 1/24]
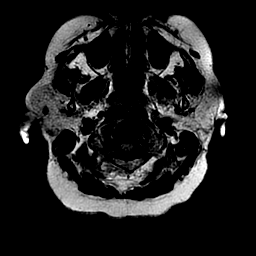
[im 24/24]
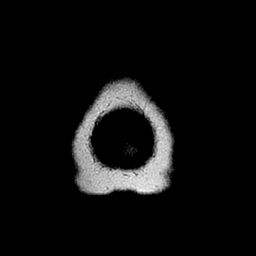

[Series 8: T1 · axial · 1.0mm · 0.47mm/px · z∈[-71,+60]mm · 8 of 132 slices shown (2 of 2)]
[im 1/132]
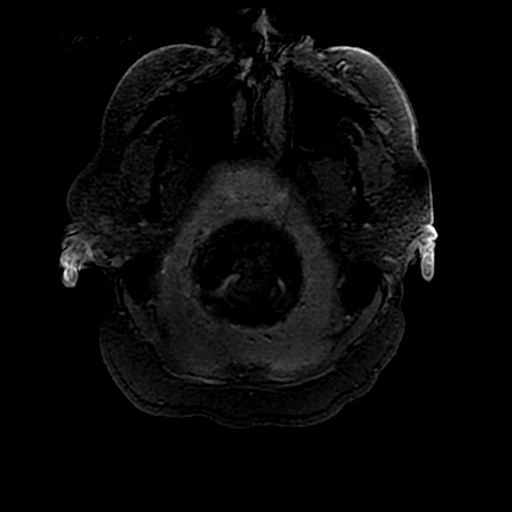
[im 24/132]
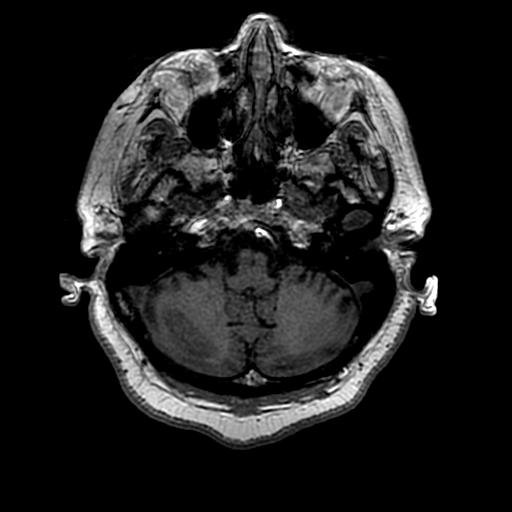
[im 36/132]
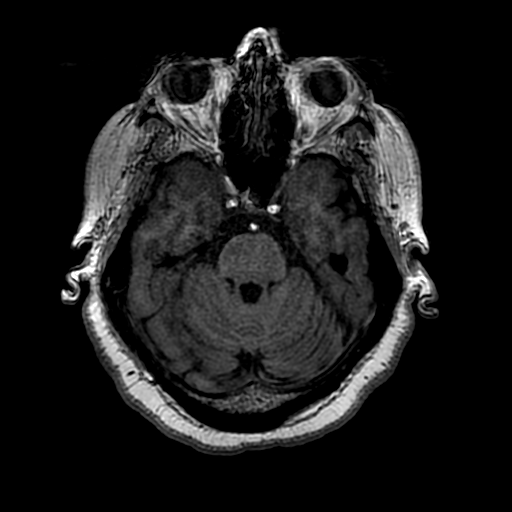
[im 60/132]
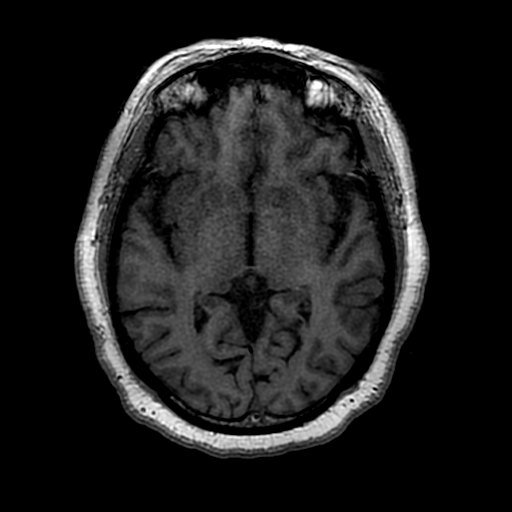
[im 72/132]
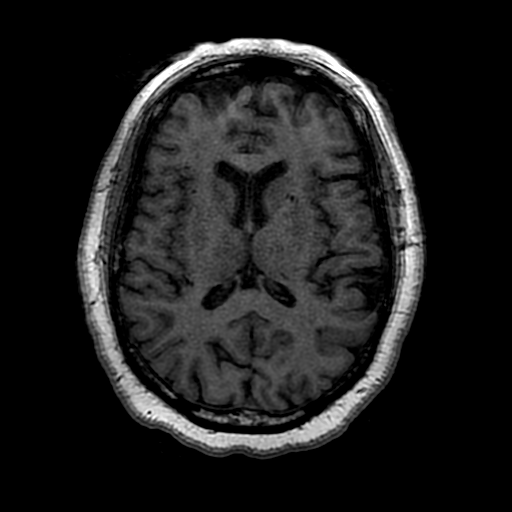
[im 96/132]
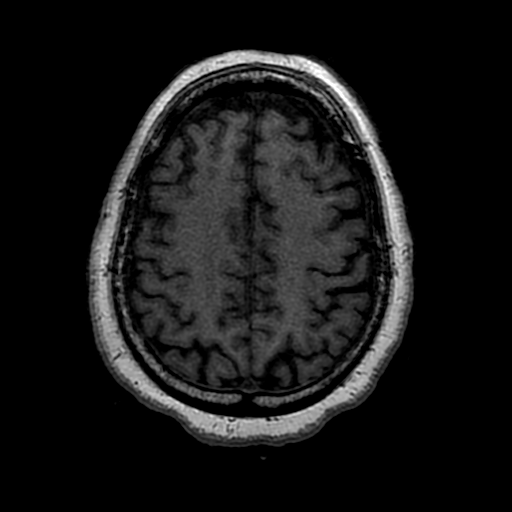
[im 108/132]
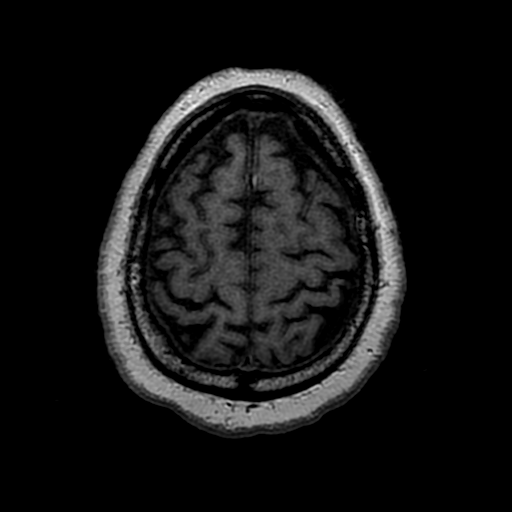
[im 132/132]
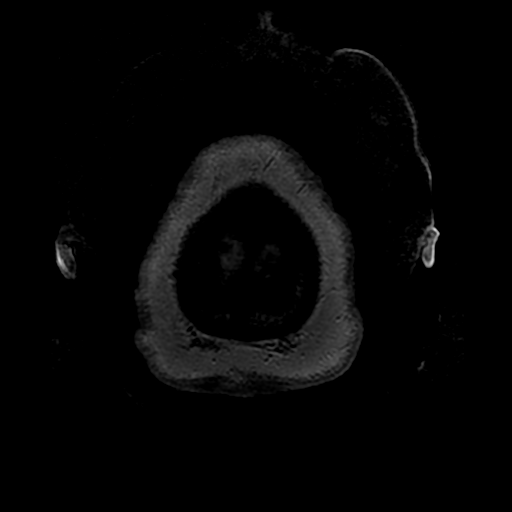

[Series 9: DWI · coronal · 4.0mm · 1.09mm/px · 8 of 82 slices shown (2 of 4)]
[im 1/82]
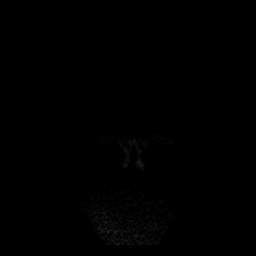
[im 12/82]
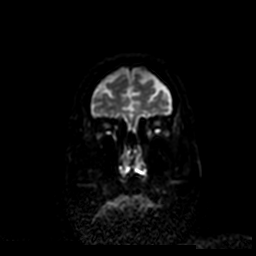
[im 24/82]
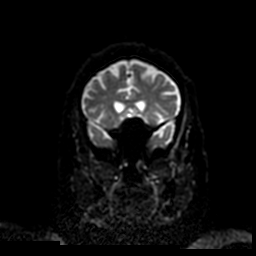
[im 35/82]
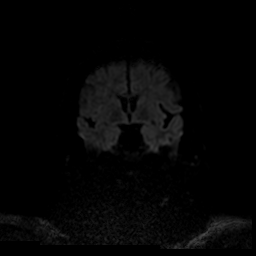
[im 47/82]
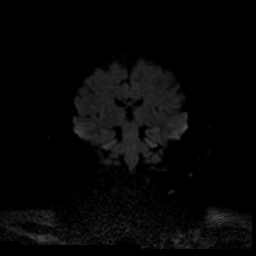
[im 58/82]
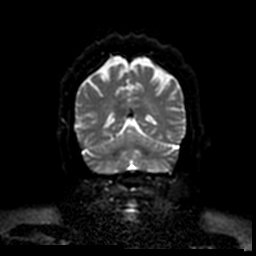
[im 70/82]
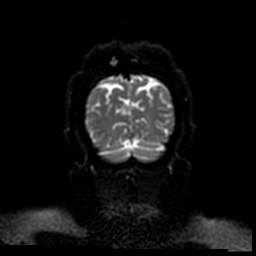
[im 82/82]
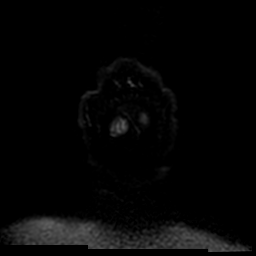

[Series 10: T2 · coronal · 5.0mm · 0.90mm/px · 2 of 26 slices shown (2 of 2)]
[im 1/26]
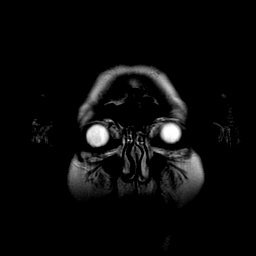
[im 26/26]
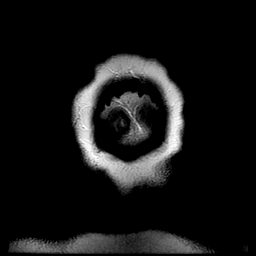

[Series 400: DWI · axial · 3.0mm · 1.09mm/px · z∈[-74,+61]mm · 4 of 44 slices shown (3 of 4)]
[im 1/44]
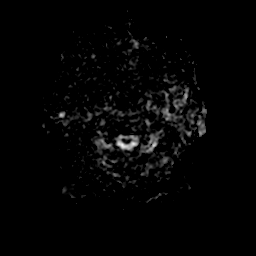
[im 15/44]
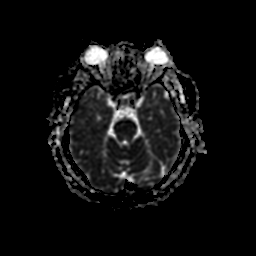
[im 29/44]
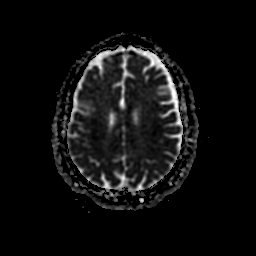
[im 44/44]
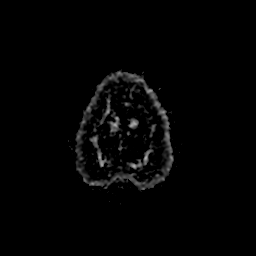

[Series 900: DWI · coronal · 4.0mm · 1.09mm/px · 4 of 41 slices shown (4 of 4)]
[im 1/41]
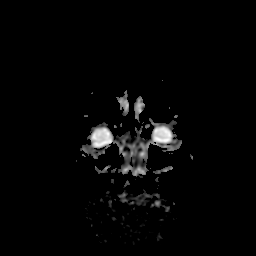
[im 14/41]
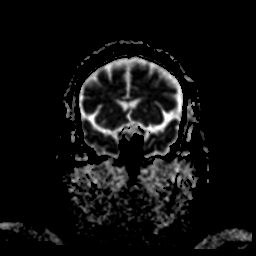
[im 27/41]
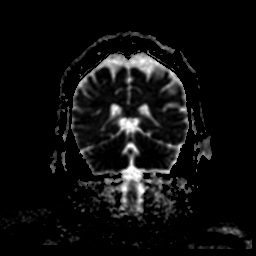
[im 41/41]
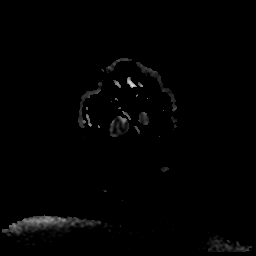

[41 of 48 positions shown; findings below may reference images not displayed]

FINDINGS: Brain: Diffusion imaging does not show any acute or subacute
infarction. The brainstem and cerebellum are normal. Cerebral
hemispheres show mild chronic small-vessel ischemic change of the
white matter. No cortical or large vessel territory infarction. No
mass lesion, hemorrhage, hydrocephalus or extra-axial collection.

Vascular: Major vessels at the base of the brain show flow.

Skull and upper cervical spine: Negative

Sinuses/Orbits: Clear/normal

Other: None
IMPRESSION: No acute or reversible finding. Mild chronic small-vessel ischemic
change of the white matter. No specific cause of the clinical
presentation is identified.

## 2021-08-12 IMAGING — MR MR CERVICAL SPINE W/O CM
5 series · 29 of 48 positions shown · IV contrast (Yes)
Comparison: CT 09/01/2016

CLINICAL DATA: Three-week history of right-sided weakness which is
worsening.

EXAM:
MRI CERVICAL SPINE WITHOUT CONTRAST
TECHNIQUE: Multiplanar, multisequence MR imaging of the cervical spine was
performed. No intravenous contrast was administered.

[Series 3: T1 · sagittal · 3.0mm · 0.45mm/px · 5 of 12 slices shown]
[im 1/12]
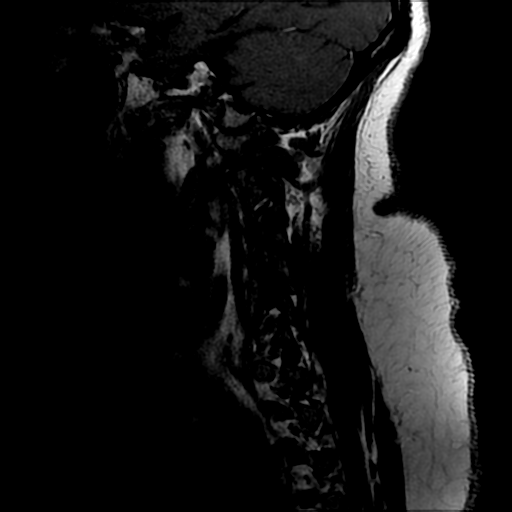
[im 3/12]
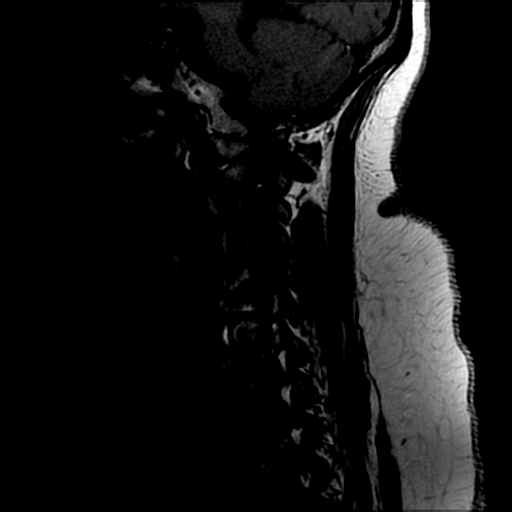
[im 6/12]
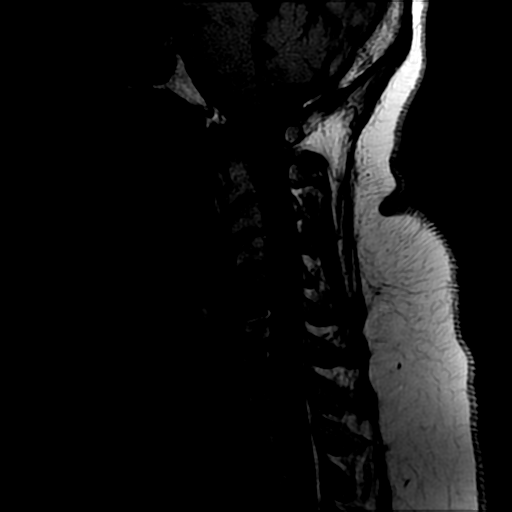
[im 9/12]
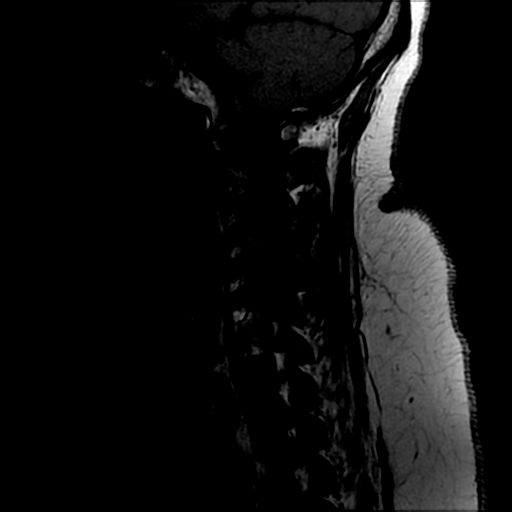
[im 12/12]
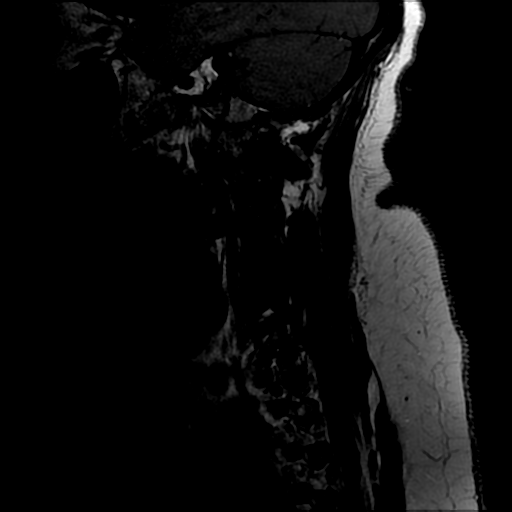

[Series 4: sag ir · sagittal · 3.0mm · 0.45mm/px · 6 of 12 slices shown]
[im 1/12]
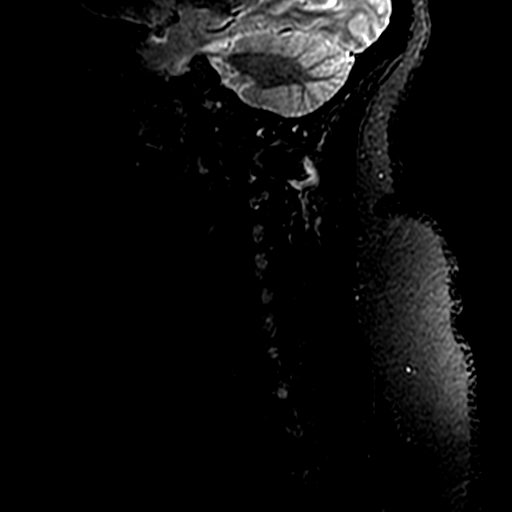
[im 3/12]
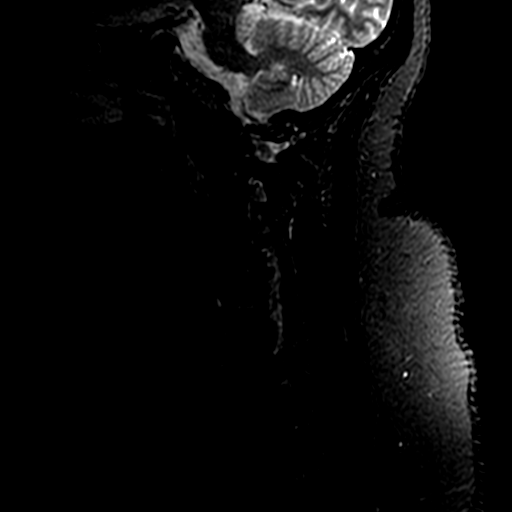
[im 5/12]
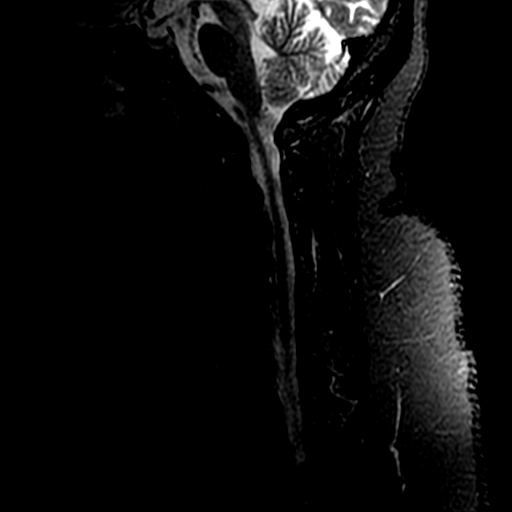
[im 7/12]
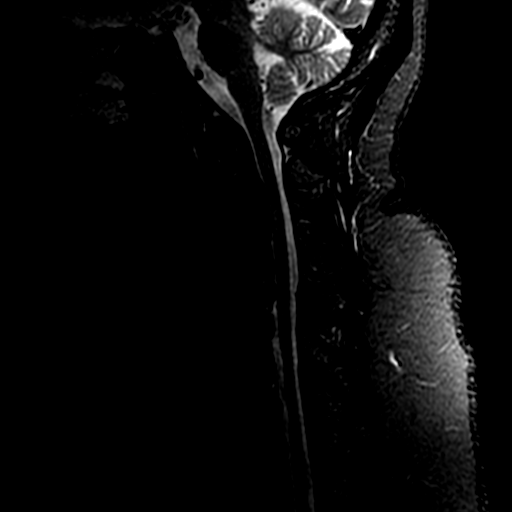
[im 9/12]
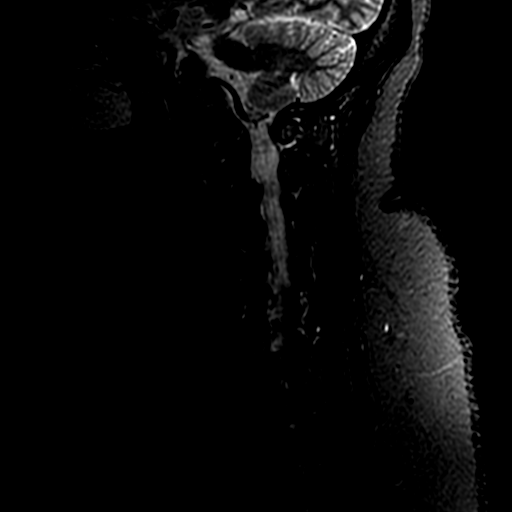
[im 12/12]
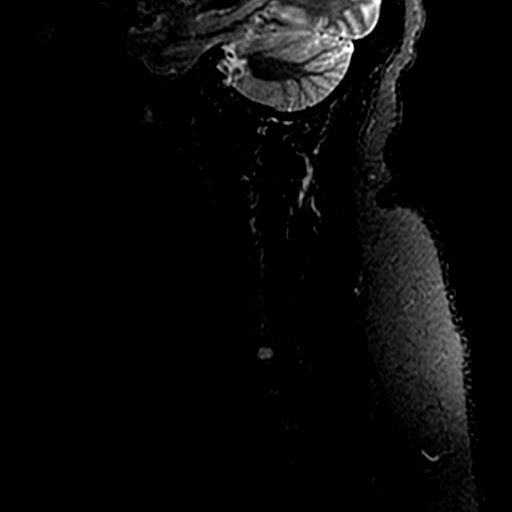

[Series 5: T2 post-contrast · sagittal · 3.0mm · 0.90mm/px · 6 of 12 slices shown]
[im 1/12]
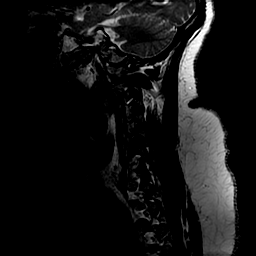
[im 3/12]
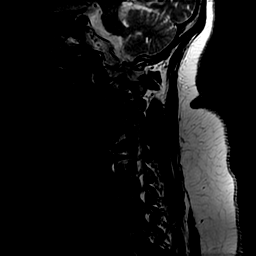
[im 5/12]
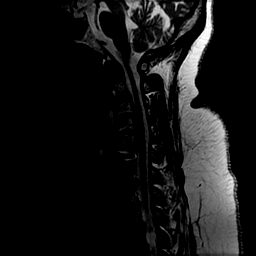
[im 7/12]
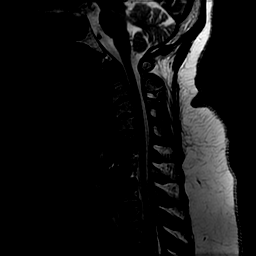
[im 9/12]
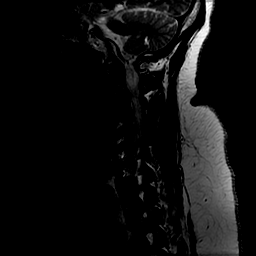
[im 12/12]
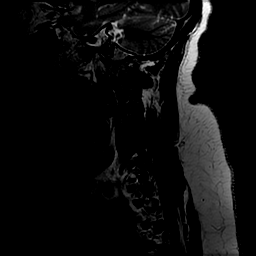

[Series 6: ax 2d merge · axial · 3.1mm · 0.35mm/px · z∈[-199,-177]mm · 3 of 25 slices shown]
[im 1/25]
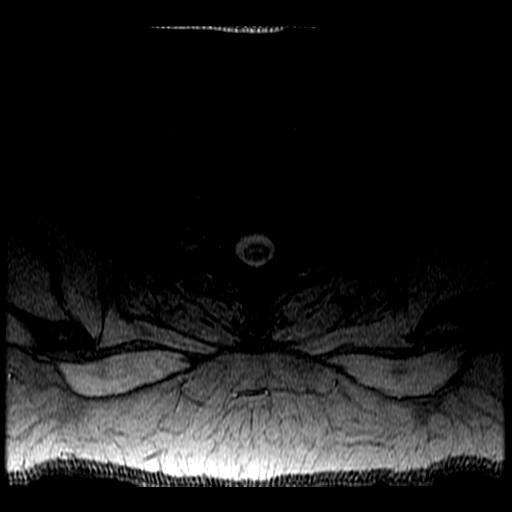
[im 5/25]
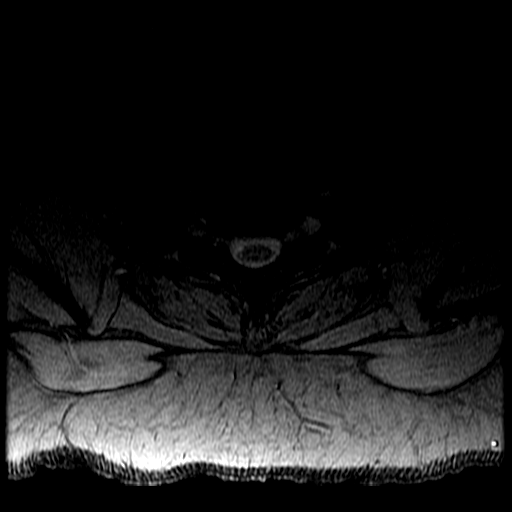
[im 7/25]
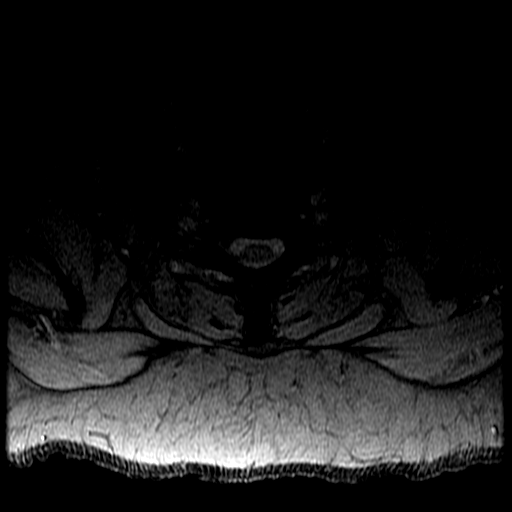

[Series 7: T2 · axial · 3.0mm · 0.86mm/px · z∈[-194,-75]mm · 9 of 40 slices shown]
[im 3/40]
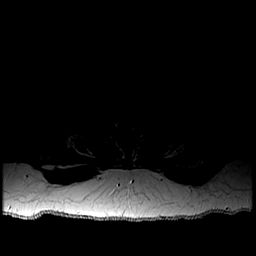
[im 7/40]
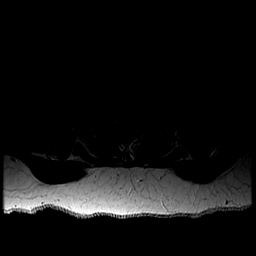
[im 11/40]
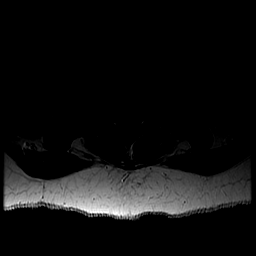
[im 18/40]
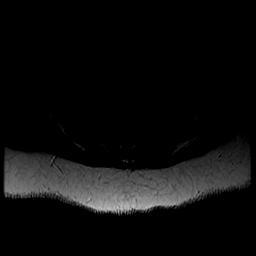
[im 20/40]
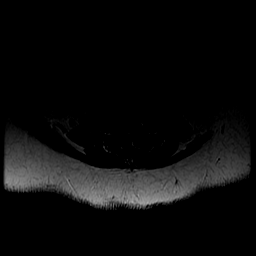
[im 22/40]
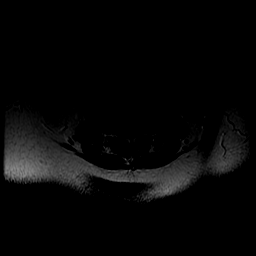
[im 29/40]
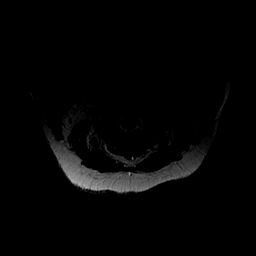
[im 33/40]
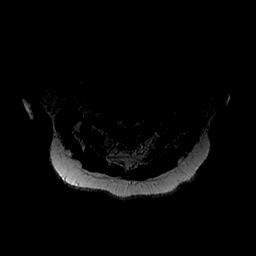
[im 37/40]
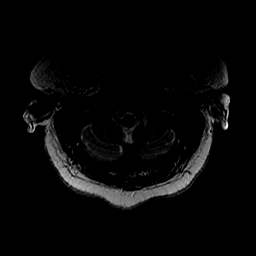

[29 of 48 positions shown; findings below may reference images not displayed]

FINDINGS: Alignment: Straightening of the normal cervical lordosis.

Vertebrae: Previous ACDF C6-7 has a good appearance.

Cord: No cord compression or primary cord lesion.

Posterior Fossa, vertebral arteries, paraspinal tissues: Negative

Disc levels:

No abnormality at the foramen magnum or C1-2.

C2-3: Shallow central disc protrusion indents the thecal sac does
not compress the cord or show foraminal extension.

C3-4: Right-sided predominant osteophytes and bulging disc material
efface the ventral subarachnoid space but do not compress the cord
or show foraminal encroachment.

C4-5: Endplate osteophytes and bulging of the disc narrows the
ventral subarachnoid space but do not compress the cord or show
foraminal encroachment.

C5-6: Spondylosis with endplate osteophytes and bulging of the disc.
Narrowing of the ventral subarachnoid space but no cord compression.
Mild foraminal narrowing on the right and moderate foraminal
narrowing on the left.

C6-7: Good appearance following previous ACDF.

C7-T1: Normal interspace.
IMPRESSION: No definite cause of the presenting symptoms is identified. Good
appearance of the previous ACDF level of C6-7.

Degenerative changes from C2-3 through C5-6 with osteophytes and
bulging disc material narrowing the ventral subarachnoid space but
not compressing the cord. There is only mild foraminal narrowing,
with exception of the left foramen at C5-6 which is moderately
narrowed.

## 2021-08-13 IMAGING — CT CT ANGIO NECK
1 of 10 series · 6 of 33 positions shown · IV contrast (OMNIPAQUE 350)
Comparison: Brain MRI 04/21/2018, head CT 03/04/2018

CLINICAL DATA: Focal neuro deficit, less than 6 hours, stroke
suspected. Additional history provided: Right-sided weakness.

EXAM:
CT ANGIOGRAPHY HEAD AND NECK
TECHNIQUE: Multidetector CT imaging of the head and neck was performed using
the standard protocol during bolus administration of intravenous
contrast. Multiplanar CT image reconstructions and MIPs were
obtained to evaluate the vascular anatomy. Carotid stenosis
measurements (when applicable) are obtained utilizing NASCET
criteria, using the distal internal carotid diameter as the
denominator.
CONTRAST:  100mL OMNIPAQUE IOHEXOL 350 MG/ML SOLN

[Series 11: ax thin · axial · 0.39mm/px · z∈[-259,-43]mm · 6 of 304 slices shown]
[im 44/304  soft-tissue]
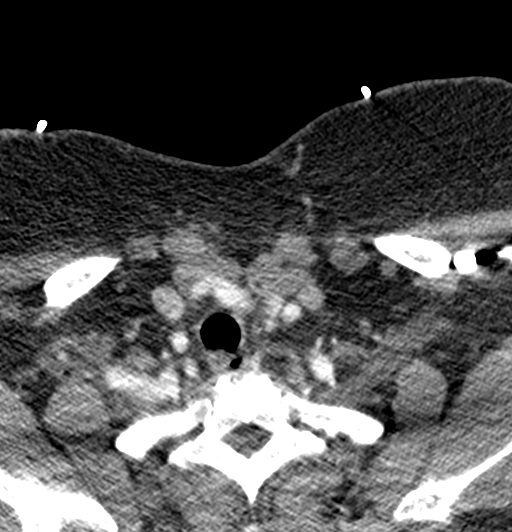
[im 87/304  bone]
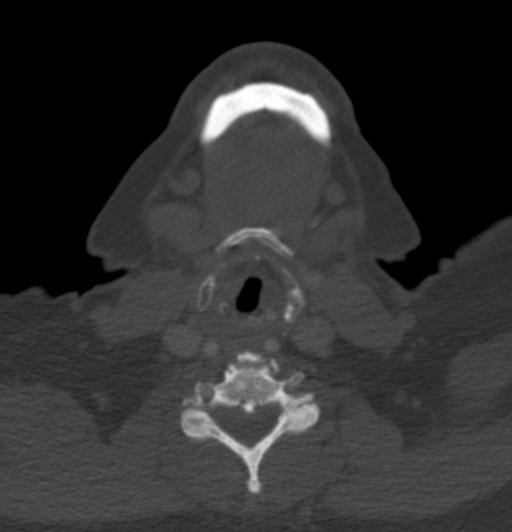
[im 130/304  soft-tissue]
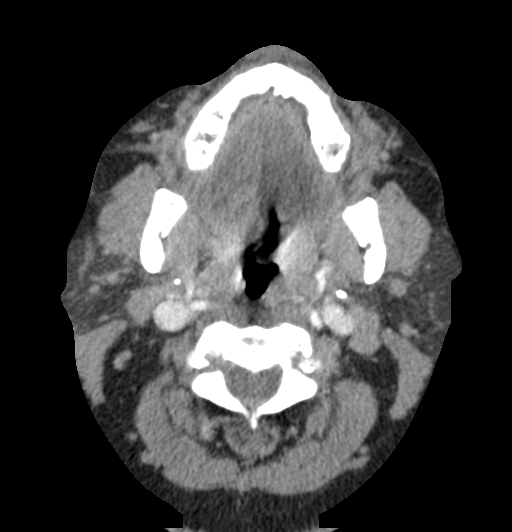
[im 174/304  bone]
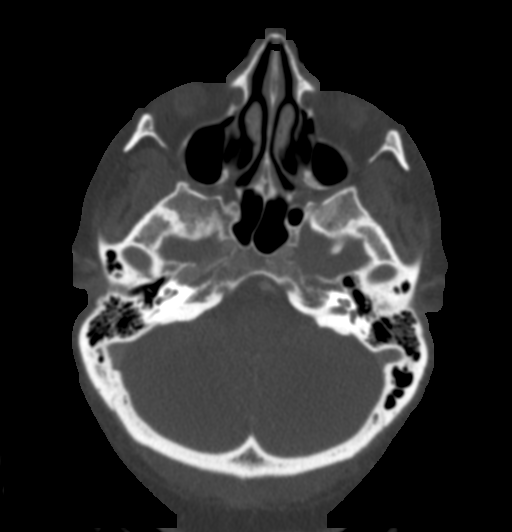
[im 217/304  soft-tissue]
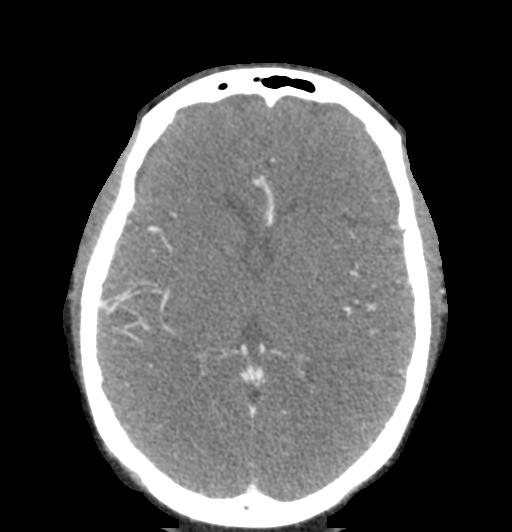
[im 260/304  bone]
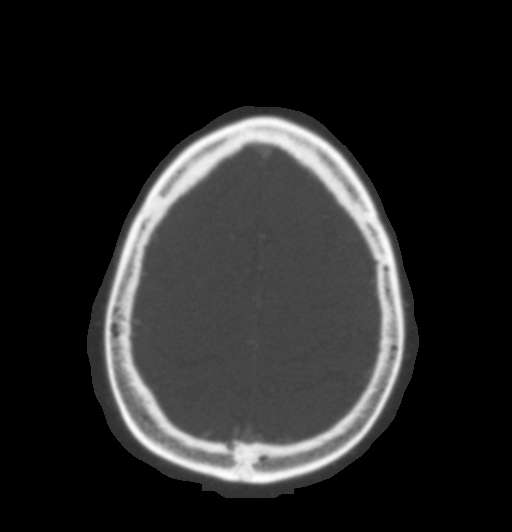

[6 of 33 positions shown; findings below may reference images not displayed]

FINDINGS: CT HEAD FINDINGS

Brain:

No evidence of acute intracranial hemorrhage.

No demarcated cortical infarction.

No evidence of intracranial mass.

No midline shift or extra-axial fluid collection.

Mild generalized parenchymal atrophy and chronic small vessel
ischemic disease.

Vascular: Reported separately

Skull: Normal. Negative for fracture or focal lesion.

Sinuses: No significant paranasal sinus disease or mastoid effusion.

Orbits: Visualized orbits demonstrate no acute abnormality.

Review of the MIP images confirms the above findings

CTA NECK FINDINGS

The examination is mildly motion degraded.

Aortic arch: Standard aortic branching.

Right carotid system: CCA and ICA patent within the neck without
stenosis. Mild calcified plaque at the carotid bifurcation.
Partially retropharyngeal course of the ICA.

Left carotid system: CCA and ICA patent within the neck without
stenosis. No significant atherosclerotic disease. Medial course of
the left common carotid artery.

Vertebral arteries: Right vertebral artery slightly dominant. The
vertebral arteries are patent within the neck bilaterally without
significant stenosis.

Skeleton: No acute bony abnormality. Reversal of the expected
cervical lordosis. Cervical spondylosis without high-grade bony
spinal canal narrowing. Redemonstrated sequela of prior C6-C7 ACDF.

Other neck: No neck mass or cervical adenopathy. The right thyroid
lobe is surgically absent.

Upper chest: No consolidation within the imaged lung apices.

Review of the MIP images confirms the above findings

CTA HEAD FINDINGS

Anterior circulation:

The intracranial internal carotid arteries are patent bilaterally
without significant stenosis.

The right middle and anterior cerebral arteries are patent without
significant proximal stenosis.

The left middle cerebral artery is patent without significant
proximal stenosis. The left anterior cerebral artery is patent. Mild
focal stenosis within the left A2 segment (series 11, image 208).

No intracranial aneurysm is identified.

Posterior circulation:

The intracranial vertebral arteries are patent without significant
stenosis, as is the basilar artery. The bilateral posterior cerebral
arteries are patent without significant proximal stenosis. Posterior
communicating arteries are poorly delineated and may be hypoplastic
or absent bilaterally.

Venous sinuses: Within limitations of contrast timing, no convincing
thrombus.

Anatomic variants: As described

Review of the MIP images confirms the above findings
IMPRESSION: CT head:

1. No evidence of acute intracranial abnormality.
2. Mild generalized parenchymal atrophy and chronic small vessel
ischemic disease.

CTA neck:

The common carotid, internal carotid and vertebral arteries are
patent within the neck without significant stenosis. Mild
atherosclerotic disease at the right carotid bifurcation.

CTA head:

1. No intracranial large vessel occlusion or proximal high-grade
arterial stenosis.
2. Mild focal stenosis within the A2 left anterior cerebral artery.

## 2021-08-17 ENCOUNTER — Other Ambulatory Visit: Payer: Self-pay | Admitting: Family Medicine

## 2021-08-18 ENCOUNTER — Telehealth: Payer: Self-pay | Admitting: Pharmacist

## 2021-08-18 NOTE — Addendum Note (Signed)
Addended by: Viona Gilmore on: 08/18/2021 01:15 PM   Modules accepted: Orders

## 2021-08-18 NOTE — Addendum Note (Signed)
Addended by: Viona Gilmore on: 08/18/2021 01:11 PM   Modules accepted: Orders

## 2021-08-18 NOTE — Chronic Care Management (AMB) (Addendum)
Chronic Care Management Pharmacy Assistant   Name: [name]  MRN: [id number] DOB: Aug 12, 1955  Reason for Encounter: Medication Review / Medication Coordination Call   Conditions to be addressed/monitored: DMII  Recent office visits:  08/11/2021 [name] MD - Patient was seen for sore throat and additional issues. Started [employer] and Fexofenadine 180 mg daily. Return if symptoms worsen or fail to improve.  Recent consult visits:  None  Hospital visits:  None  Medications: Outpatient Encounter Medications as of 08/18/2021  Medication Sig   Accu-Chek Softclix Lancets lancets USE TO check blood glucose UP TO four times daily AS DIRECTED   albuterol (PROVENTIL) (2.5 MG/3ML) 0.083% nebulizer solution USE 1 VIAL IN NEBULIZER EVERY 6 HOURS - and as needed   albuterol (VENTOLIN HFA) 108 (90 Base) MCG/ACT inhaler Inhale 2 puffs into the lungs every 4 (four) hours as needed for shortness of breath.   blood glucose meter kit and supplies KIT Dispense based on patient and insurance preference. Use up to four times daily as directed. (FOR ICD-9 250.00, 250.01).   brexpiprazole (REXULTI) 2 MG TABS tablet Take 1 tablet by mouth daily.   budesonide-formoterol (SYMBICORT) 160-4.5 MCG/ACT inhaler Inhale 2 puffs into the lungs 2 (two) times daily.   canagliflozin (INVOKANA) 100 MG TABS tablet Take 1 tablet (100 mg total) by mouth daily before breakfast.   CARTIA XT 240 MG 24 hr capsule Take 1 capsule (240 mg total) by mouth daily.   cloNIDine (CATAPRES) 0.1 MG tablet TAKE ONE TABLET BY MOUTH BEFORE BREAKFAST and TAKE ONE TABLET BY MOUTH EVERYDAY AT BEDTIME   diclofenac sodium (VOLTAREN) 1 % GEL Apply 2 g topically daily as needed (fibromyalgia pain).   DULoxetine (CYMBALTA) 20 MG capsule Take 20 mg by mouth daily.   DULoxetine (CYMBALTA) 60 MG capsule Take 60 mg by mouth at bedtime.    FEROSUL 325 (65 Fe) MG tablet TAKE ONE TABLET BY MOUTH ONCE DAILY   fexofenadine (ALLEGRA)  180 MG tablet Take 1 tablet (180 mg total) by mouth daily.   fluticasone (FLONASE) 50 MCG/ACT nasal spray Place 2 sprays into both nostrils 2 (two) times daily.   furosemide (LASIX) 80 MG tablet TAKE ONE TABLET BY MOUTH TWICE DAILY   gabapentin (NEURONTIN) 300 MG capsule TAKE 1 CAPSULE(300 MG) BY MOUTH TWICE DAILY   glucose blood (ACCU-CHEK GUIDE) test strip USE TO check blood glucose UP TO four times daily AS DIRECTED   LINZESS 145 MCG CAPS capsule TAKE ONE CAPSULE BY MOUTH every other DAY   magic mouthwash SOLN Take 5 mLs by mouth 4 (four) times daily as needed for mouth pain.   meclizine (ANTIVERT) 25 MG tablet Take 1 tablet (25 mg total) by mouth 3 (three) times daily as needed for dizziness.   metFORMIN (GLUCOPHAGE) 500 MG tablet TAKE ONE TABLET BY MOUTH EVERYDAY AT BEDTIME   metoCLOPramide (REGLAN) 10 MG tablet Take 1 tablet (10 mg total) by mouth every 6 (six) hours as needed for nausea (or headache).   omeprazole (PRILOSEC) 20 MG capsule TAKE ONE CAPSULE BY MOUTH BEFORE BREAKFAST and TAKE ONE CAPSULE BY MOUTH EVERY EVENING   potassium chloride (MICRO-K) 10 MEQ CR capsule TAKE TWO CAPSULES BY MOUTH EVERYDAY AT BEDTIME   pravastatin (PRAVACHOL) 40 MG tablet TAKE ONE TABLET BY MOUTH EVERY EVENING   spironolactone (ALDACTONE) 25 MG tablet Take 0.5 tablets (12.5 mg total) by mouth daily.   umeclidinium-vilanterol (ANORO ELLIPTA) 62.5-25 MCG/INH AEPB Inhale into the lungs.  Vitamin D, Ergocalciferol, (DRISDOL) 1.25 MG (50000 UNIT) CAPS capsule Take 1 capsule (50,000 Units total) by mouth every 7 (seven) days.   warfarin (COUMADIN) 10 MG tablet TAKE ONE TABLET BY MOUTH Daily. As 07/14/21   warfarin (COUMADIN) 5 MG tablet TAKE ONE TABLET BY MOUTH EVERY M-W-F OR AS DIRECTED   zonisamide (ZONEGRAN) 50 MG capsule 1 TABLET AM AND 2 TABLETS PM   Facility-Administered Encounter Medications as of 08/18/2021  Medication   sodium chloride flush (NS) 0.9 % injection 3 mL   Reviewed chart for medication  changes ahead of medication coordination call.  No OVs, Consults, or hospital visits since last care coordination call/Pharmacist visit. (If appropriate, list visit date, provider name)  No medication changes indicated OR if recent visit, treatment plan here.  BP Readings from Last 3 Encounters:  08/11/21 136/90  07/14/21 (!) 175/90  03/29/21 126/80    Lab Results  Component Value Date   HGBA1C 8.4 (H) 04/21/2021     Patient obtains medications through Adherence Packaging  30 Days    Last adherence delivery included: Rexulti 2 mg - take 1 tablet at breakfast Duloxetine 30 mg  - take 3 capsules at bedtime (verified with patient) Clonidine 0.1 mg - take 1 tablet before breakfast and 1 tablet at bedtime Warfarin 10 mg - take 1 tablet daily or as directed Cartia XT 240 mg - take 1 capsule before breakfast Furosemide 80 mg - take 1 tablet at breakfast and 1 tablet at dinner Metformin 500 mg - take 1 tablet at bedtime Invokana 100 mg - take 1 tablet before breakfast Potassium CL ER 67mq -  take 2 capsules at bedtime Pravastatin 40 mg  - take 1 tablet at dinner Omeprazole - 20 mg - take 1 capsule before breakfast and 1 capsule at dinner Linzess 145 mcg - Take one capsule every other day Spironolactone 25 mg - take 1/2 tablet at breakfast Quetiapine 50 mg - take 1 tablet at breakfast and bedtime  Symbicort 160/4.5 - use 2 puff twice daily Gabapentin 300 mg - take 1 capsule with breakfast and dinner Zonisamide 50 mg  - take 1 capsule at breakfast and 2 capsules at dinner Albuterol HFA 2 puffs every 4 hours as needed Accu-chek test strips Accu-chek softclix lancets   Patient declined (meds) last month: Ferrous Sulfate 325 mg patient picked this up OTC and has plenty on hand.        Patient is due for next adherence delivery on: 08/30/2021   Called patient and reviewed medications and coordinated delivery.   This delivery to include:  Duloxetine 30 mg  - take 3 capsules at  bedtime (verified with patient) Clonidine 0.1 mg - take 1 tablet before breakfast and 1 tablet at bedtime Warfarin 10 mg - take 1 tablet daily or as directed Cartia XT 240 mg - take 1 capsule before breakfast Furosemide 80 mg - take 1 tablet at breakfast and 1 tablet at dinner Metformin 500 mg - take 1 tablet at bedtime Invokana 100 mg - take 1 tablet before breakfast Potassium CL ER 131m -  take 2 capsules at bedtime Pravastatin 40 mg  - take 1 tablet at dinner Omeprazole - 20 mg - take 1 capsule before breakfast and 1 capsule at dinner Linzess 145 mcg - Take one capsule every other day Spironolactone 25 mg - take 1/2 tablet at breakfast Symbicort 160/4.5 - use 2 puff twice daily Gabapentin 300 mg - take 1 capsule with breakfast and dinner  Zonisamide 50 mg  - take 1 capsule at breakfast and 2 capsules at dinner Albuterol HFA 2 puffs every 4 hours as needed Fexofenadine 180 mg once daily at breakfast Accu-chek test strips Accu-chek softclix lancets Quetiapine 50 mg - take 1 tablet at breakfast and bedtime (not on epic med list,  patient states she is still taking this)   Patient will need a short fill: No short fill needed   Coordinated acute fill: No acute fill needed.   Patient declined the following medications:   Rexulti 2 mg - patient states she is no longer taking this medication   Confirmed delivery date of 08/30/2021, advised patient that pharmacy will contact them the morning of delivery.    Care Gaps: AWV - scheduled 02/24/2022 Last BP - 136/90 on 08/11/2021 Last A1C - 8.4 on 04/21/2021 Shingrix - never done Foot Exam - overdue Tdap - postponed Pneumonia vaccine - postponed Hep C  Screen - postponed  Star Rating Drugs: Invokana 100 mg - last filled 07/25/2021 30 DS at [facility] Metformin 500 mg - last filled 07/25/2021 30 DS at [facility] Pravastatin 40 mg - last filled 07/25/2021 30 DS at [facility] [phone]  Adjusted patient's medication list to reflect recent changes.  [name], PharmD, [place] Pharmacist [place] [facility] [place]

## 2021-08-22 ENCOUNTER — Encounter: Payer: Self-pay | Admitting: Family Medicine

## 2021-08-30 IMAGING — CT CT HEAD W/O CM
3 of 4 series · 15 of 47 positions shown, 18 images · non-contrast
Comparison: February 20, 2019

CLINICAL DATA: Ams

EXAM:
CT HEAD WITHOUT CONTRAST
TECHNIQUE: Contiguous axial images were obtained from the base of the skull
through the vertex without intravenous contrast.

[Series 4: head 2.0 h70h · axial · 0.45mm/px · z∈[-177,-59]mm · 9 of 75 slices shown, 12 images]
[im 8/75  brain]
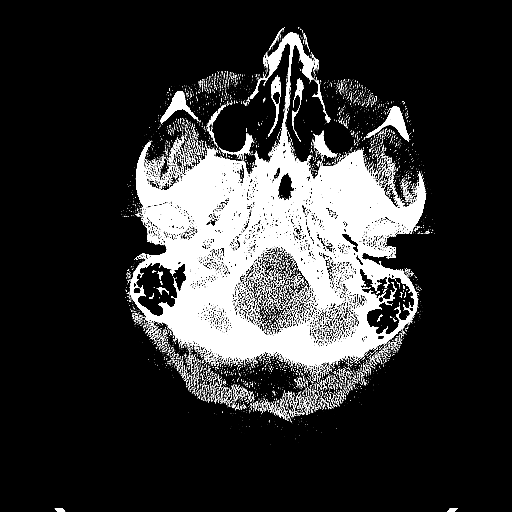
[im 8/75  bone]
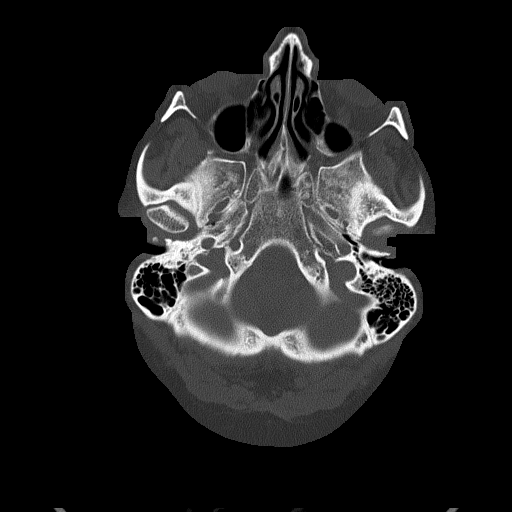
[im 15/75  brain]
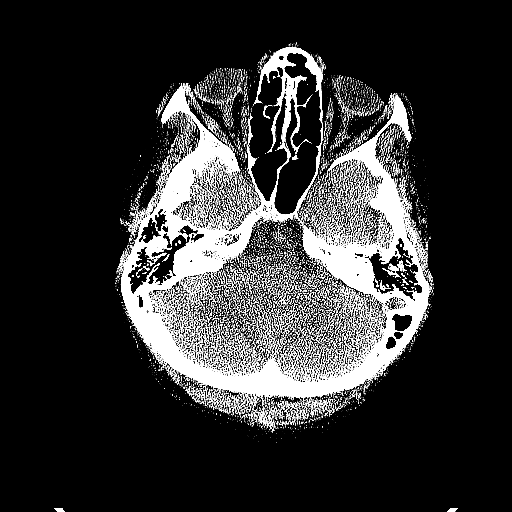
[im 23/75  brain]
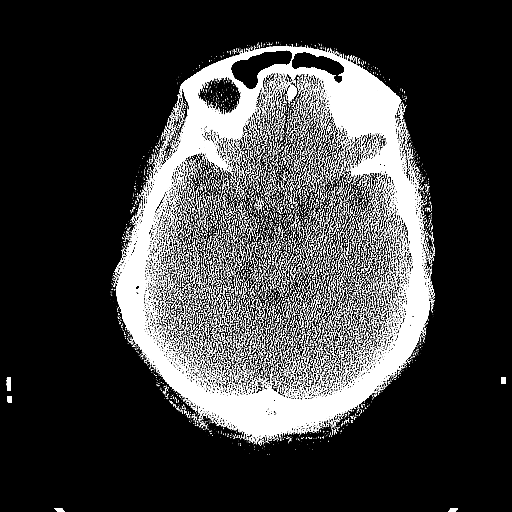
[im 30/75  brain]
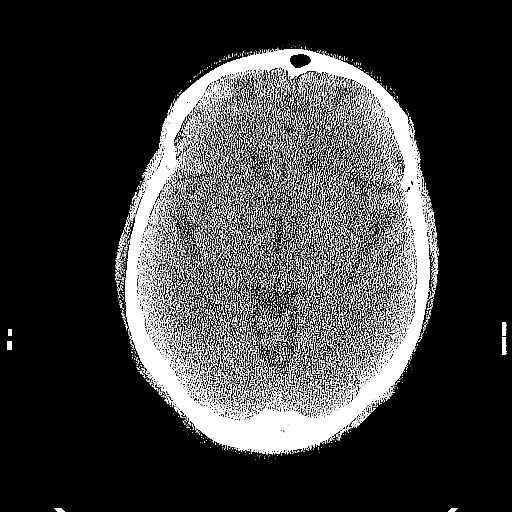
[im 38/75  brain]
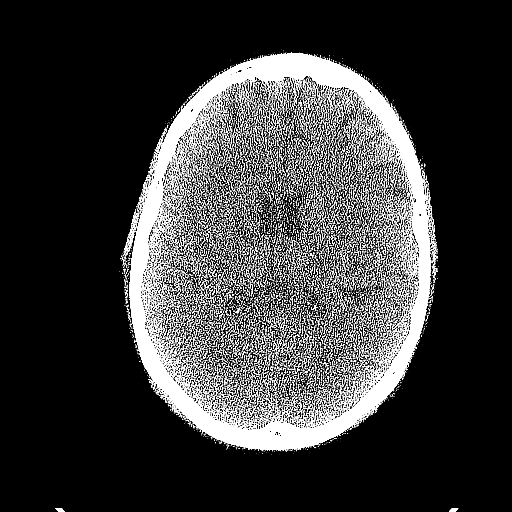
[im 38/75  bone]
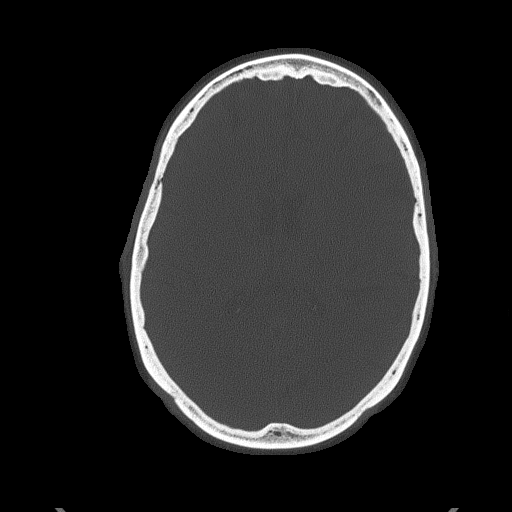
[im 45/75  brain]
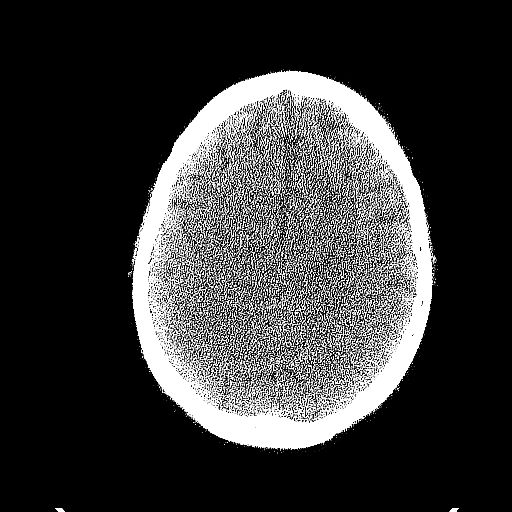
[im 52/75  brain]
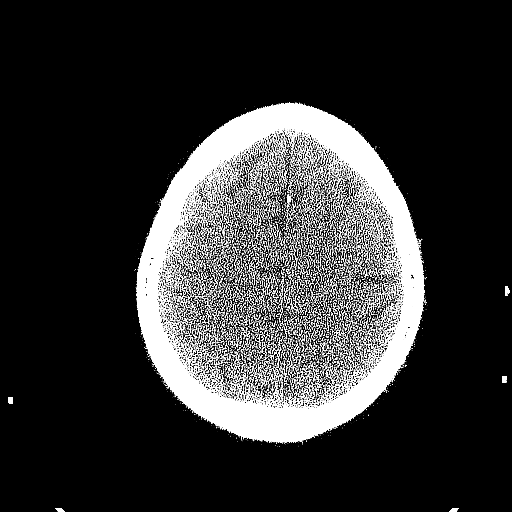
[im 60/75  brain]
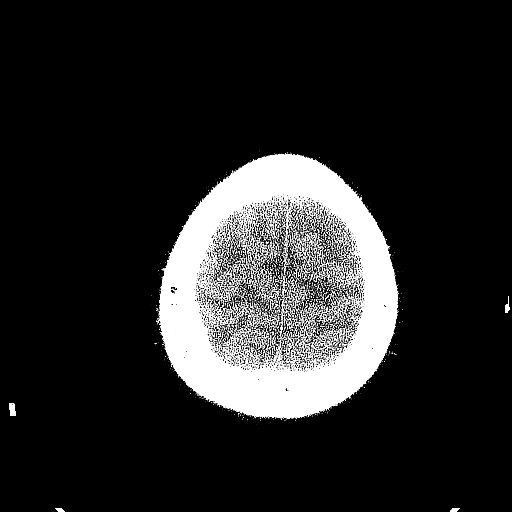
[im 67/75  brain]
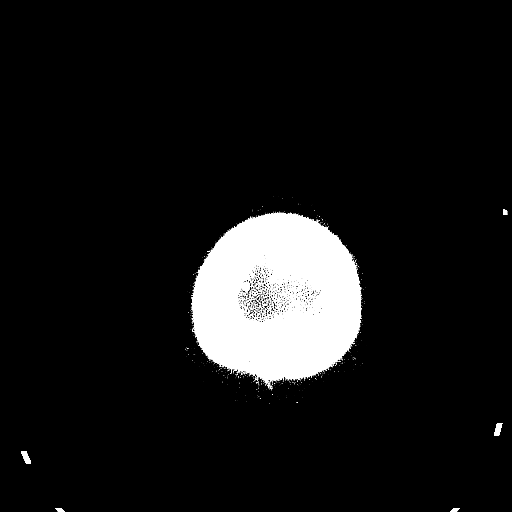
[im 67/75  bone]
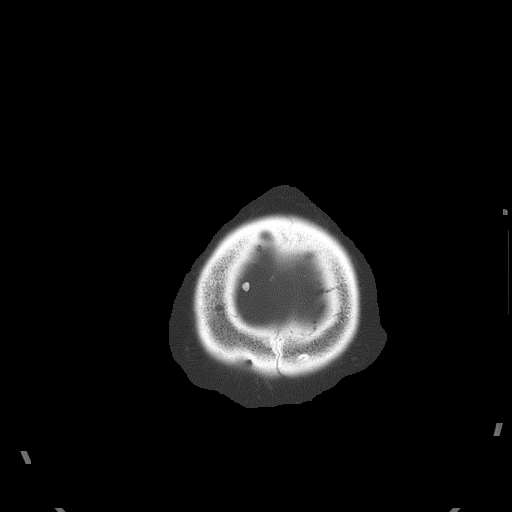

[Series 5: head 3.0 mpr cor · coronal · 0.33mm/px · 3 of 65 slices shown]
[im 22/65  brain]
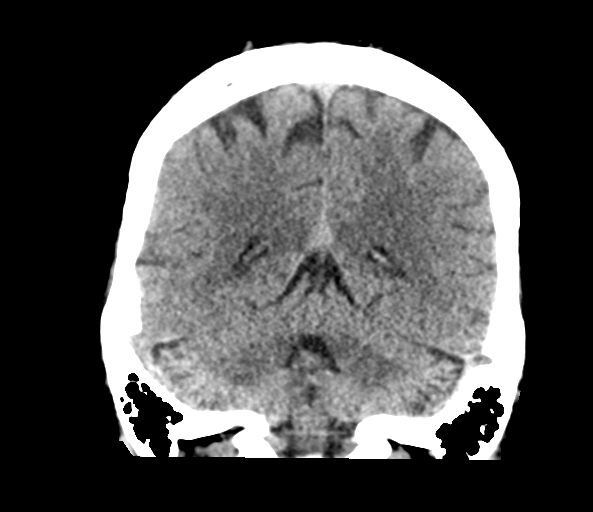
[im 29/65  brain]
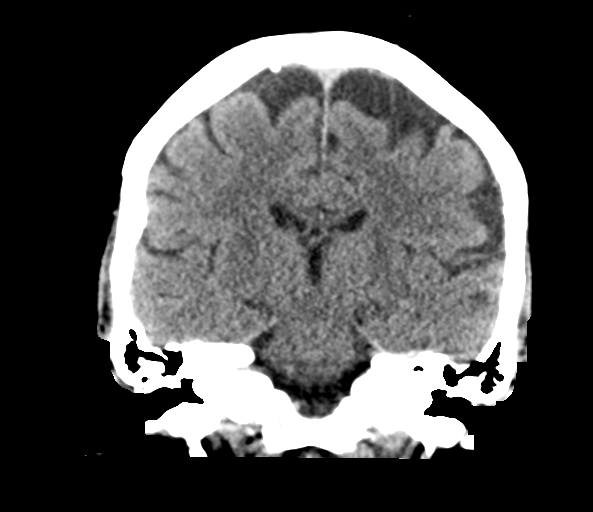
[im 36/65  brain]
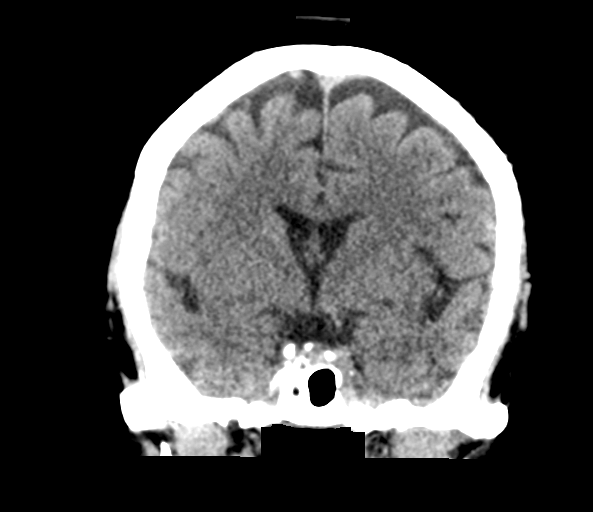

[Series 6: head 3.0 mpr sag · sagittal · 0.32mm/px · 3 of 53 slices shown]
[im 18/53  brain]
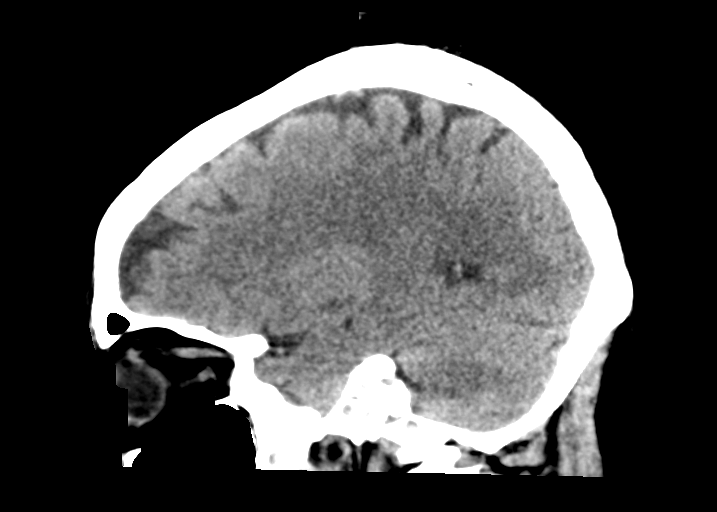
[im 27/53  brain]
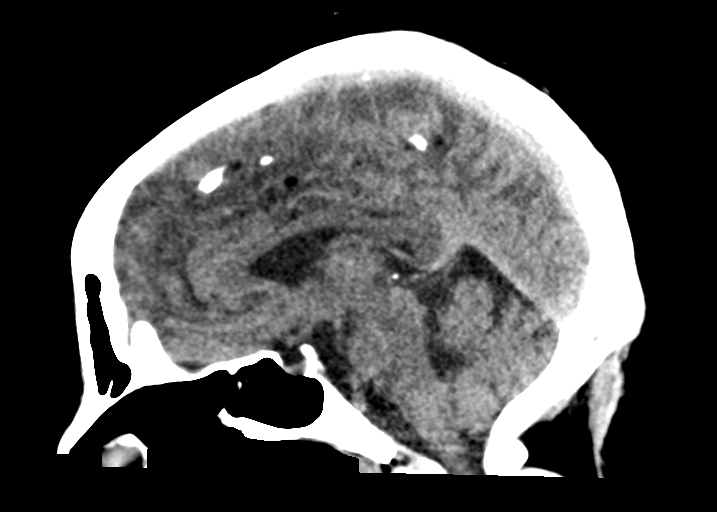
[im 35/53  brain]
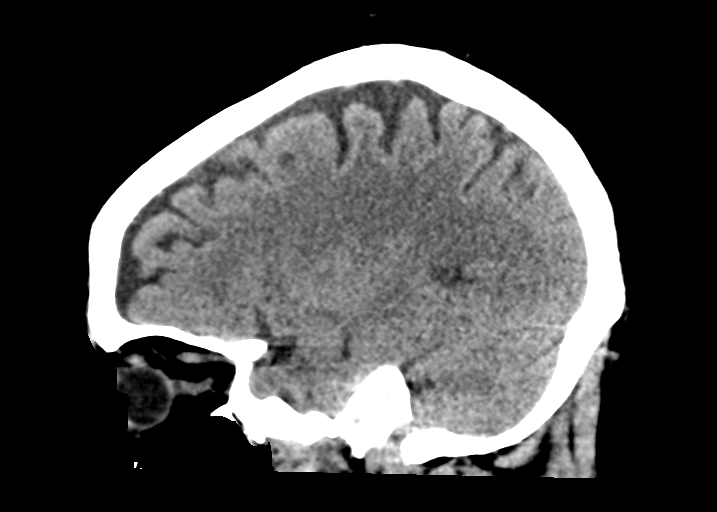

[15 of 47 positions shown; findings below may reference images not displayed]

FINDINGS: Brain: No evidence of acute territorial infarction, hemorrhage,
hydrocephalus,extra-axial collection or mass lesion/mass effect.
There is mild dilatation the ventricles and sulci consistent with
age-related atrophy. Low-attenuation changes in the deep white
matter consistent with small vessel ischemia.

Vascular: No hyperdense vessel or unexpected calcification.

Skull: The skull is intact. No fracture or focal lesion identified.

Sinuses/Orbits: The visualized paranasal sinuses and mastoid air
cells are clear. The orbits and globes intact.

Other: None
IMPRESSION: 1. No acute intracranial abnormality.
2. Findings consistent with mild age related atrophy and chronic
small vessel ischemia

## 2021-08-31 IMAGING — MR MR HEAD W/O CM
4 series · 48 of 48 positions shown · non-contrast
Comparison: 02/19/2019

CLINICAL DATA: Intermittent right sided weakness and shaking

EXAM:
MRI HEAD WITHOUT CONTRAST
TECHNIQUE: Multiplanar, multiecho pulse sequences of the brain and surrounding
structures were obtained without intravenous contrast. Axial and
coronal DWI sequences were obtained without intravenous contrast.
Patient could not complete the remainder of the study.

[Series 4: DWI · axial · 3.0mm · 1.09mm/px · z∈[-38,+106]mm · 18 of 98 slices shown (1 of 4)]
[im 1/98]
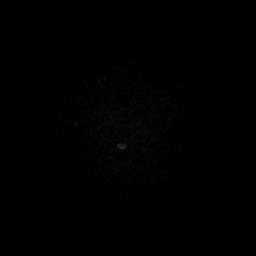
[im 6/98]
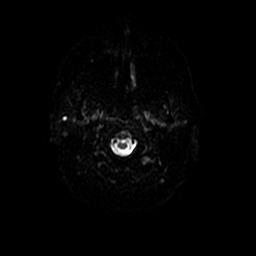
[im 12/98]
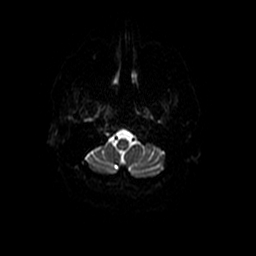
[im 18/98]
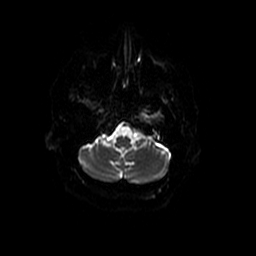
[im 23/98]
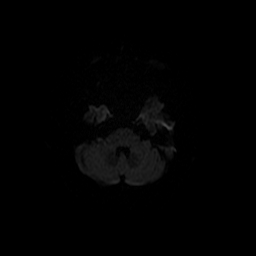
[im 29/98]
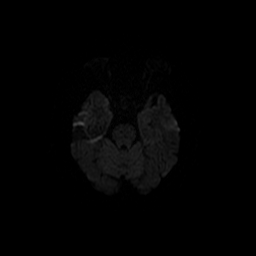
[im 35/98]
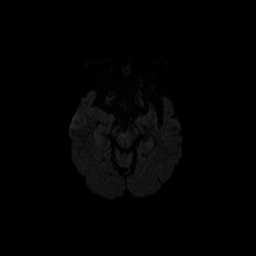
[im 40/98]
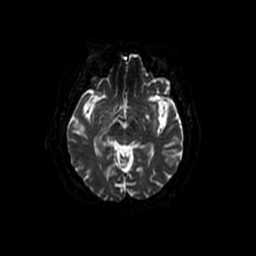
[im 46/98]
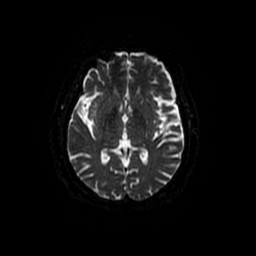
[im 52/98]
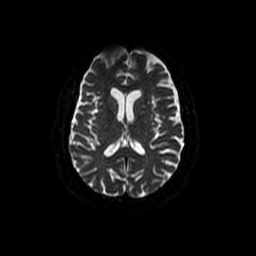
[im 58/98]
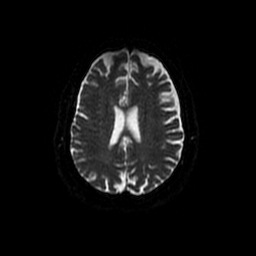
[im 63/98]
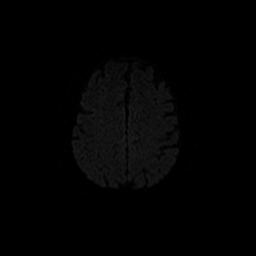
[im 69/98]
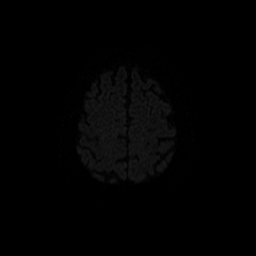
[im 75/98]
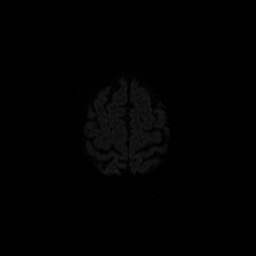
[im 80/98]
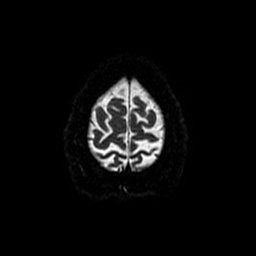
[im 86/98]
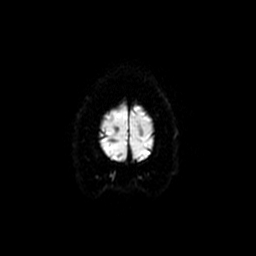
[im 92/98]
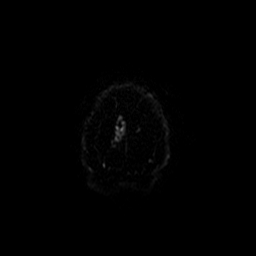
[im 98/98]
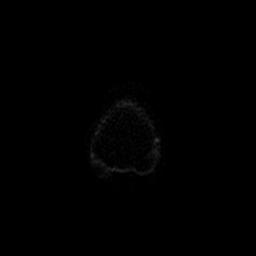

[Series 5: DWI · coronal · 5.0mm · 1.09mm/px · 14 of 76 slices shown (2 of 4)]
[im 1/76]
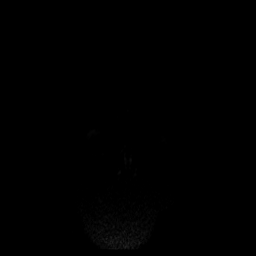
[im 6/76]
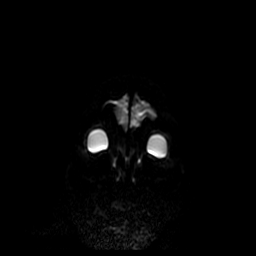
[im 12/76]
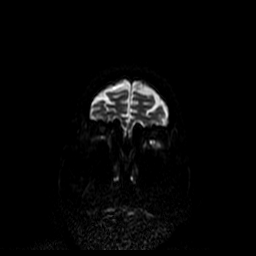
[im 18/76]
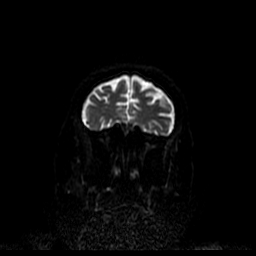
[im 24/76]
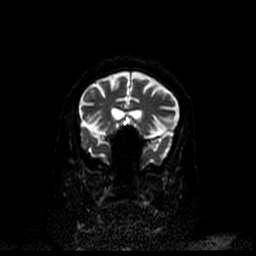
[im 29/76]
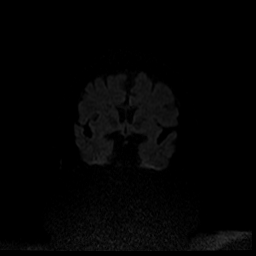
[im 35/76]
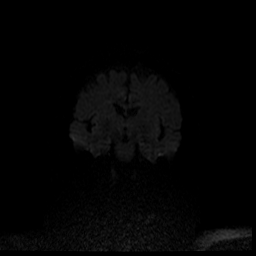
[im 41/76]
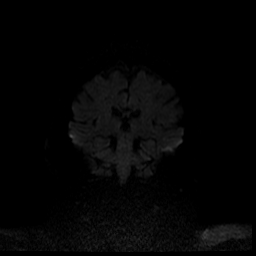
[im 47/76]
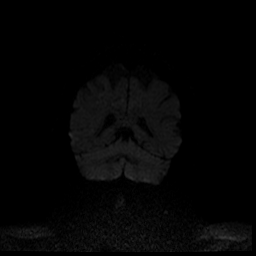
[im 52/76]
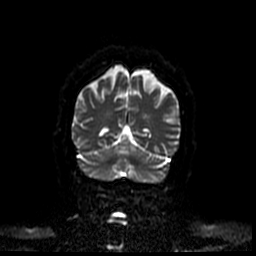
[im 58/76]
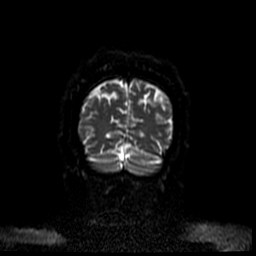
[im 64/76]
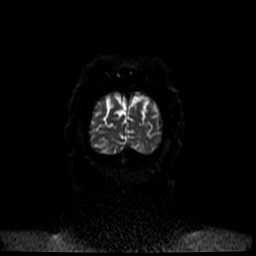
[im 70/76]
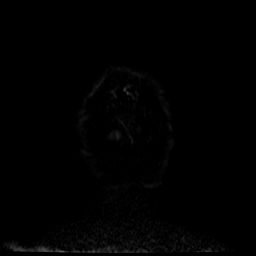
[im 76/76]
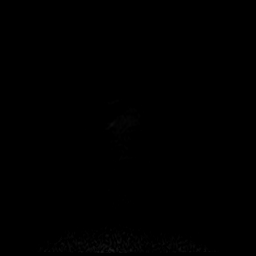

[Series 400: DWI · axial · 3.0mm · 1.09mm/px · z∈[-38,+106]mm · 9 of 49 slices shown (3 of 4)]
[im 1/49]
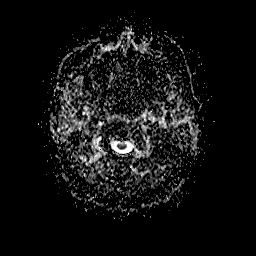
[im 7/49]
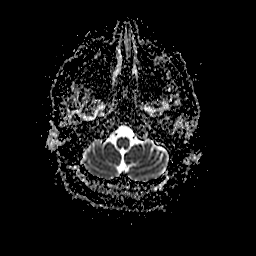
[im 13/49]
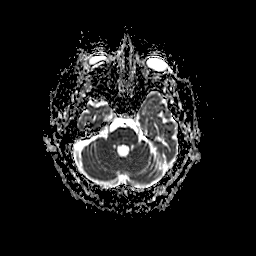
[im 19/49]
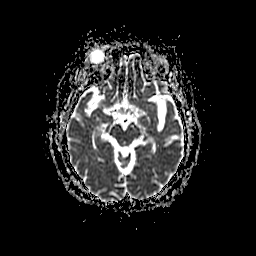
[im 25/49]
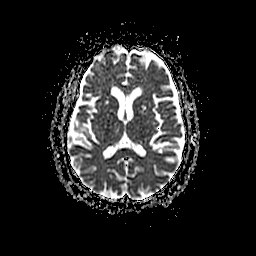
[im 31/49]
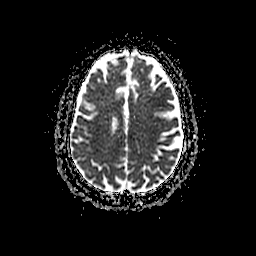
[im 37/49]
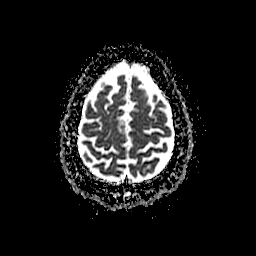
[im 43/49]
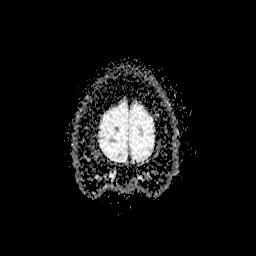
[im 49/49]
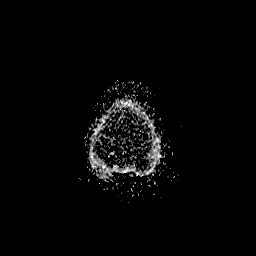

[Series 500: DWI · coronal · 5.0mm · 1.09mm/px · 7 of 38 slices shown (4 of 4)]
[im 1/38]
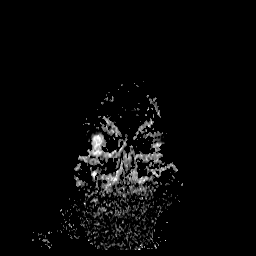
[im 7/38]
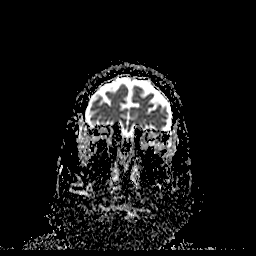
[im 13/38]
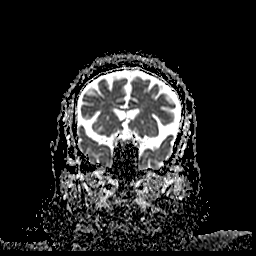
[im 19/38]
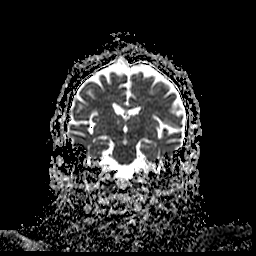
[im 25/38]
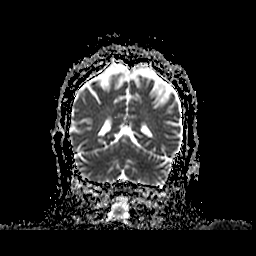
[im 31/38]
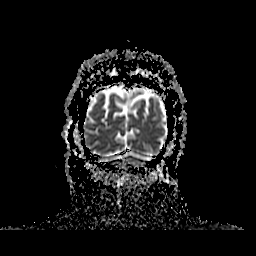
[im 38/38]
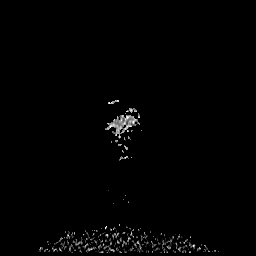

[48 of 48 positions shown; findings below may reference images not displayed]

FINDINGS: Diffusion-weighted imaging demonstrates no evidence of acute
infarction. There is no significant mass effect. There is no
hydrocephalus.
IMPRESSION: Incomplete study demonstrates no evidence of acute infarction,
significant mass effect, or hydrocephalus.

## 2021-09-06 DIAGNOSIS — J449 Chronic obstructive pulmonary disease, unspecified: Secondary | ICD-10-CM | POA: Diagnosis not present

## 2021-09-14 ENCOUNTER — Telehealth: Payer: Self-pay | Admitting: Pharmacist

## 2021-09-14 ENCOUNTER — Other Ambulatory Visit: Payer: Self-pay

## 2021-09-14 ENCOUNTER — Other Ambulatory Visit: Payer: Self-pay | Admitting: Oncology

## 2021-09-14 ENCOUNTER — Other Ambulatory Visit: Payer: Self-pay | Admitting: Family Medicine

## 2021-09-14 DIAGNOSIS — Z7901 Long term (current) use of anticoagulants: Secondary | ICD-10-CM

## 2021-09-14 DIAGNOSIS — R0982 Postnasal drip: Secondary | ICD-10-CM

## 2021-09-14 NOTE — Chronic Care Management (AMB) (Unsigned)
Chronic Care Management Pharmacy Assistant   Name: [name]  MRN: [id number] DOB: 02-22-1956  Reason for Encounter: Disease State and Medication Review   Conditions to be addressed/monitored: HTN   Recent office visits:  None  Recent consult visits:  None  Hospital visits:  None  Medications: Outpatient Encounter Medications as of 09/14/2021  Medication Sig   Accu-Chek Softclix Lancets lancets USE TO check blood glucose UP TO four times daily AS DIRECTED   albuterol (PROVENTIL) (2.5 MG/3ML) 0.083% nebulizer solution USE 1 VIAL IN NEBULIZER EVERY 6 HOURS - and as needed   albuterol (VENTOLIN HFA) 108 (90 Base) MCG/ACT inhaler Inhale 2 puffs into the lungs every 4 (four) hours as needed for shortness of breath.   blood glucose meter kit and supplies KIT Dispense based on patient and insurance preference. Use up to four times daily as directed. (FOR ICD-9 250.00, 250.01).   budesonide-formoterol (SYMBICORT) 160-4.5 MCG/ACT inhaler Inhale 2 puffs into the lungs 2 (two) times daily.   canagliflozin (INVOKANA) 100 MG TABS tablet Take 1 tablet (100 mg total) by mouth daily before breakfast.   CARTIA XT 240 MG 24 hr capsule Take 1 capsule (240 mg total) by mouth daily.   cloNIDine (CATAPRES) 0.1 MG tablet TAKE ONE TABLET BY MOUTH BEFORE BREAKFAST and TAKE ONE TABLET BY MOUTH EVERYDAY AT BEDTIME   diclofenac sodium (VOLTAREN) 1 % GEL Apply 2 g topically daily as needed (fibromyalgia pain).   DULoxetine (CYMBALTA) 30 MG capsule Take 90 mg by mouth at bedtime.   FEROSUL 325 (65 Fe) MG tablet TAKE ONE TABLET BY MOUTH ONCE DAILY   fexofenadine (ALLEGRA) 180 MG tablet Take 1 tablet (180 mg total) by mouth daily.   fluticasone (FLONASE) 50 MCG/ACT nasal spray Place 2 sprays into both nostrils 2 (two) times daily.   furosemide (LASIX) 80 MG tablet TAKE ONE TABLET BY MOUTH TWICE DAILY   gabapentin (NEURONTIN) 300 MG capsule TAKE 1 CAPSULE(300 MG) BY MOUTH TWICE DAILY   glucose  blood (ACCU-CHEK GUIDE) test strip USE TO check blood glucose UP TO four times daily AS DIRECTED   LINZESS 145 MCG CAPS capsule TAKE ONE CAPSULE BY MOUTH every other DAY   magic mouthwash SOLN Take 5 mLs by mouth 4 (four) times daily as needed for mouth pain.   meclizine (ANTIVERT) 25 MG tablet Take 1 tablet (25 mg total) by mouth 3 (three) times daily as needed for dizziness.   metFORMIN (GLUCOPHAGE) 500 MG tablet TAKE ONE TABLET BY MOUTH EVERYDAY AT BEDTIME   metoCLOPramide (REGLAN) 10 MG tablet Take 1 tablet (10 mg total) by mouth every 6 (six) hours as needed for nausea (or headache).   omeprazole (PRILOSEC) 20 MG capsule TAKE ONE CAPSULE BY MOUTH BEFORE BREAKFAST and TAKE ONE CAPSULE BY MOUTH EVERY EVENING   potassium chloride (MICRO-K) 10 MEQ CR capsule TAKE TWO CAPSULES BY MOUTH EVERYDAY AT BEDTIME   pravastatin (PRAVACHOL) 40 MG tablet TAKE ONE TABLET BY MOUTH EVERY EVENING   QUEtiapine (SEROQUEL) 50 MG tablet Take 50 mg by mouth 2 (two) times daily.   spironolactone (ALDACTONE) 25 MG tablet Take 0.5 tablets (12.5 mg total) by mouth daily.   Vitamin D, Ergocalciferol, (DRISDOL) 1.25 MG (50000 UNIT) CAPS capsule Take 1 capsule (50,000 Units total) by mouth every 7 (seven) days.   warfarin (COUMADIN) 10 MG tablet TAKE ONE TABLET BY MOUTH Daily. As 07/14/21   warfarin (COUMADIN) 5 MG tablet TAKE ONE TABLET BY MOUTH EVERY M-W-F OR AS  DIRECTED   zonisamide (ZONEGRAN) 50 MG capsule 1 TABLET AM AND 2 TABLETS PM   Facility-Administered Encounter Medications as of 09/14/2021  Medication   sodium chloride flush (NS) 0.9 % injection 3 mL  Reviewed chart for medication changes ahead of medication coordination call.  No OVs, Consults, or hospital visits since last care coordination call/Pharmacist visit. (If appropriate, list visit date, provider name)  No medication changes indicated OR if recent visit, treatment plan here.  BP Readings from Last 3 Encounters:  08/11/21 136/90  07/14/21 (!)  175/90  03/29/21 126/80    Lab Results  Component Value Date   HGBA1C 8.4 (H) 04/21/2021   Reviewed chart prior to disease state call. Spoke with patient regarding BP  Recent Office Vitals: BP Readings from Last 3 Encounters:  08/11/21 136/90  07/14/21 (!) 175/90  03/29/21 126/80   Pulse Readings from Last 3 Encounters:  08/11/21 82  07/14/21 88  03/29/21 60    Wt Readings from Last 3 Encounters:  08/11/21 289 lb 3.2 oz (131.2 kg)  07/14/21 287 lb (130.2 kg)  03/29/21 291 lb 9.6 oz (132.3 kg)     Kidney Function Lab Results  Component Value Date/Time   CREATININE 0.87 04/21/2021 10:42 AM   CREATININE 0.73 02/21/2021 03:32 PM   CREATININE 0.81 02/02/2020 11:26 AM   GFR 69.69 04/21/2021 10:42 AM   GFRNONAA >60 02/18/2021 01:37 PM   GFRNONAA 77 02/02/2020 11:26 AM   GFRAA 94 03/05/2020 11:55 AM   GFRAA 89 02/02/2020 11:26 AM       Latest Ref Rng & Units 04/21/2021   10:42 AM 02/28/2021    8:00 AM 02/21/2021    3:32 PM  BMP  Glucose 70 - 99 mg/dL 164   175   BUN 6 - 23 mg/dL 17   12   Creatinine 0.40 - 1.20 mg/dL 0.87   0.73   BUN/Creat Ratio 12 - 28   16   Sodium 135 - 145 mEq/L 139  142  140   Potassium 3.5 - 5.1 mEq/L 3.1  3.5  4.1   Chloride 96 - 112 mEq/L 101   103   CO2 19 - 32 mEq/L 28   24   Calcium 8.4 - 10.5 mg/dL 9.3   9.4     Current antihypertensive regimen:  Cartia XT 240 mg daily Clonidine 0.1 mg twice daily Spironolactone 25 mg 1/2 tablet daily  How often are you checking your Blood Pressure? {CHL HP BP Monitoring Frequency:[id number]}  Current home BP readings: ***  What recent interventions/DTPs have been made by any provider to improve Blood Pressure control since last CPP Visit: No recent interventions  Any recent hospitalizations or ED visits since last visit with CPP? No recent hospital visits  What diet changes have been made to improve Blood Pressure Control?  Patient follows Breakfast - patient will have Lunch - patient will  have Dinner - patient will have  What exercise is being done to improve your Blood Pressure Control?  ***  Adherence Review: Is the patient currently on ACE/ARB medication? No Does the patient have >5 day gap between last estimated fill dates? No   Patient obtains medications through Adherence Packaging  30 Days    Last adherence delivery included: Zonisamide 50 mg  - take 1 capsule at breakfast and 2 capsules at dinner Gabapentin 300 mg - take 1 capsule with breakfast and dinner Symbicort 160/4.5 - use 2 puff twice daily Spironolactone 25 mg - take 1/2  tablet at breakfast Furosemide 80 mg - take 1 tablet at breakfast and 1 tablet at dinner Pravastatin 40 mg  - take 1 tablet at dinner Cartia XT 240 mg - take 1 capsule before breakfast Invokana 100 mg - take 1 tablet before breakfast Potassium CL ER 72mq -  take 2 capsules at bedtime Warfarin 10 mg - take 1 tablet daily or as directed Metformin 500 mg - take 1 tablet at bedtime Fexofenadine 180 mg once daily at breakfast  Duloxetine 30 mg  - take 3 capsules at bedtime (verified with patient) Quetiapine 50 mg - take 1 tablet at breakfast and bedtime (not on epic med list,  patient states she is still taking this) Clonidine 0.1 mg - take 1 tablet before breakfast and 1 tablet at bedtime Accu-chek test strips Accu-chek softclix lancets Albuterol HFA 2 puffs every 4 hours as needed Linzess 145 mcg - Take one capsule every other day Omeprazole - 20 mg - take 1 capsule before breakfast and 1 capsule at dinner   Patient declined (meds) last month:   Rexulti 2 mg - patient states she is no longer taking this medication     Patient is due for next adherence delivery on: 09/28/2021   Called patient and reviewed medications and coordinated delivery.   This delivery to include: Zonisamide 50 mg  - take 1 capsule at breakfast and 2 capsules at dinner Gabapentin 300 mg - take 1 capsule with breakfast and dinner Symbicort 160/4.5 - use  2 puff twice daily Spironolactone 25 mg - take 1/2 tablet at breakfast Furosemide 80 mg - take 1 tablet at breakfast and 1 tablet at dinner Pravastatin 40 mg  - take 1 tablet at dinner Cartia XT 240 mg - take 1 capsule before breakfast Invokana 100 mg - take 1 tablet before breakfast Potassium CL ER 162m -  take 2 capsules at bedtime Warfarin 10 mg - take 1 tablet daily or as directed Metformin 500 mg - take 1 tablet at bedtime Fexofenadine 180 mg once daily at breakfast  Duloxetine 30 mg  - take 3 capsules at bedtime (verified with patient) Quetiapine 50 mg - take 1 tablet at breakfast and bedtime (not on epic med list,  patient states she is still taking this) Clonidine 0.1 mg - take 1 tablet before breakfast and 1 tablet at bedtime Accu-chek test strips Accu-chek softclix lancets Albuterol HFA 2 puffs every 4 hours as needed Linzess 145 mcg - Take one capsule every other day Omeprazole - 20 mg - take 1 capsule before breakfast and 1 capsule at dinner    Patient will need a short fill: No short fill needed   Coordinated acute fill: No acute fill needed.   Patient declined the following medications:     Confirmed delivery date of 09/28/2021, advised patient that pharmacy will contact them the morning of delivery.     Care Gaps: AWV - scheduled 02/24/2022 Last BP -136/90 on 08/11/2021 Last A1C - 8.4 on 04/21/2021 Shingrix - never done Foot exam - overdue Tdap - postponed Pneumonia vaccine - postponed Hep C Screen - postponed  Star Rating Drugs: Invokana 100 mg - last filled 08/24/2021 30 DS at [facility] Metformin 500 mg - last filled 08/24/2021 30 DS at [facility] Pravastatin 40 mg - [facility] [place]20[place] [phone]

## 2021-09-15 NOTE — Addendum Note (Signed)
Addended by: Viona Gilmore on: 09/15/2021 10:46 AM   Modules accepted: Orders

## 2021-09-19 ENCOUNTER — Telehealth: Payer: Self-pay

## 2021-09-19 ENCOUNTER — Inpatient Hospital Stay: Payer: Medicare Other | Attending: Oncology

## 2021-09-19 ENCOUNTER — Other Ambulatory Visit: Payer: Self-pay

## 2021-09-19 DIAGNOSIS — Z86718 Personal history of other venous thrombosis and embolism: Secondary | ICD-10-CM | POA: Insufficient documentation

## 2021-09-19 DIAGNOSIS — Z7901 Long term (current) use of anticoagulants: Secondary | ICD-10-CM | POA: Insufficient documentation

## 2021-09-19 LAB — PROTIME-INR
INR: 3.3 — ABNORMAL HIGH (ref 0.8–1.2)
Prothrombin Time: 33.1 seconds — ABNORMAL HIGH (ref 11.4–15.2)

## 2021-09-19 NOTE — Telephone Encounter (Signed)
Pt denied being on antibiotics.Pt verbalized understanding. Lab appointment made for 09/22/21.

## 2021-09-19 NOTE — Telephone Encounter (Signed)
-----   Message from Ladell Pier, MD sent at 09/19/2021  5:20 PM EDT ----- Please call patient, PT/INR is high, has she been on any recent antibiotics?,  Hold Coumadin and repeat PT/INR 09/22/2021

## 2021-09-22 ENCOUNTER — Inpatient Hospital Stay: Payer: Medicare Other

## 2021-09-22 DIAGNOSIS — Z86718 Personal history of other venous thrombosis and embolism: Secondary | ICD-10-CM | POA: Diagnosis not present

## 2021-09-22 DIAGNOSIS — Z7901 Long term (current) use of anticoagulants: Secondary | ICD-10-CM | POA: Diagnosis not present

## 2021-09-22 LAB — PROTIME-INR
INR: 2.1 — ABNORMAL HIGH (ref 0.8–1.2)
Prothrombin Time: 23 seconds — ABNORMAL HIGH (ref 11.4–15.2)

## 2021-09-23 ENCOUNTER — Telehealth: Payer: Self-pay

## 2021-09-23 NOTE — Telephone Encounter (Signed)
-----   Message from Ladell Pier, MD sent at 09/22/2021  5:33 PM EDT ----- Please call patient, PT/INR is lower, resume Coumadin at previous dose, repeat PT/INR in 3-4 weeks

## 2021-09-23 NOTE — Telephone Encounter (Signed)
Call placed to pt per MD Benay Spice and message given (see note below). Pt verbalizes understanding and confirms next appt.

## 2021-09-29 DIAGNOSIS — J449 Chronic obstructive pulmonary disease, unspecified: Secondary | ICD-10-CM | POA: Diagnosis not present

## 2021-10-06 NOTE — Progress Notes (Signed)
Cardiology Office Note:    Date:  10/07/2021   ID:  [name], DOB 03-Jul-1955, MRN [id number]  PCP:  Billie Ruddy, MD  [facility] Cardiologist:  [name] MD [place] Electrophysiologist:  None   CC: SOB f/u  History of Present Illness:    [name] is a 66 y.o. 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  2021: had ziopatch with no concerning findings.   2022:  patient CCTA and Echo- notable from improved LAP and aortic atherosclerosis, and a hiatal hernia. Had diuretics with RHC showing that she was euvolemic; still have SOB 2023:  Dyspnea related to Morbid Obesity, Asthma, and OSA with CPAP intolerance now with a better CPAP and new adherence  Patient notes that she is doing fine.   Since last visit notes that she is still cooking and doing well. There are no interval hospital/ED visit.    No chest pain or pressure.  No SOB/DOE and no PND/Orthopnea.  No weight gain (has lost 4 lbs).  Has a new doctor, who took her off a nerve pill, and changed her medications; she feels much better. No palpitations or syncope.  Ambulatory blood pressure not done.  Past Medical History:  Diagnosis Date   Adrenal mass ([place]) 03/226   Benign   Arthritis    knees/multiple orthopedic conditons; lower back   Asthma    per pt   Clotting disorder (HCC)    +beta-2-glycoprotein IgA antibody   COPD (chronic obstructive pulmonary disease) (HCC)    inhalers dependent on environment   Depression    Diabetes mellitus    120s usually fasting -  dx more than 10 yrs ago   Dizziness, nonspecific    DVT (deep venous thrombosis) (HCC)    Recurrent   Fibromyalgia    GERD (gastroesophageal reflux disease)    Heart murmur    History of blood transfusion    History of cocaine abuse ([place])    Remote history     Hyperlipidemia    Hypertension    takes meds daily   Hypothyroidism    Lupus Anticoagulant Positive    On home oxygen therapy    at night   Shortness of breath    exertion or lying flat   Sleep apnea    2l of oxygen at night (as of 12/6, she used to)    Past Surgical History:  Procedure Laterality Date   ABDOMINAL HYSTERECTOMY  1989   Fibroids   ANTERIOR LUMBAR FUSION  01/03/2012   Procedure: ANTERIOR LUMBAR FUSION 1 LEVEL;  Surgeon: Winfield Cunas, MD;  Location: [place] [facility];  Service: Neurosurgery;  Laterality: N/A;  Lumbar Four-Five Anterior Lumbar Interbody Fusion with Instrumentation   BACK SURGERY     cervical spine---disk disease   CARPAL TUNNEL RELEASE     Bilateral   CESAREAN SECTION     CHOLECYSTECTOMY     CYSTOSCOPY/RETROGRADE/URETEROSCOPY/STONE EXTRACTION WITH BASKET Right 04/26/2014   Procedure: CYSTOSCOPY/ RIGHT RETROGRADE/ RIGHT URETEROSCOPY/URETERAL AND RENAL PELVIS BIOPSY;  Surgeon: Alexis Frock, MD;  Location: [facility];  Service: Urology;  Laterality: Right;   gallstones removed     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   bilateral   right elbow surgery     RIGHT HEART CATH N/A 02/28/2021   Procedure: RIGHT HEART CATH;  Surgeon: Lauree Chandler  D, MD;  Location: [place] [facility];  Service: Cardiovascular;  Laterality: N/A;   THYROID LOBECTOMY Right 02/27/2017   THYROID LOBECTOMY Right 02/27/2017   Procedure: RIGHT THYROID LOBECTOMY;  Surgeon: Erroll Luna, MD;  Location: [place];  Service: General;  Laterality: Right;   TUBAL LIGATION      Current Medications: Current Meds  Medication Sig   Accu-Chek Softclix Lancets lancets USE TO check blood glucose UP TO four times daily AS DIRECTED   albuterol (PROVENTIL) (2.5 MG/3ML) 0.083% nebulizer solution USE 1 VIAL IN NEBULIZER EVERY 6 HOURS - and as needed   albuterol (VENTOLIN HFA) 108 (90 Base) MCG/ACT inhaler Inhale 2 puffs into the lungs every 4 (four) hours as needed for shortness of breath.   blood  glucose meter kit and supplies KIT Dispense based on patient and insurance preference. Use up to four times daily as directed. (FOR ICD-9 250.00, 250.01).   budesonide-formoterol (SYMBICORT) 160-4.5 MCG/ACT inhaler Inhale 2 puffs into the lungs 2 (two) times daily.   canagliflozin (INVOKANA) 100 MG TABS tablet Take 1 tablet (100 mg total) by mouth daily before breakfast.   CARTIA XT 240 MG 24 hr capsule Take 1 capsule (240 mg total) by mouth daily.   cloNIDine (CATAPRES) 0.1 MG tablet TAKE ONE TABLET BY MOUTH BEFORE BREAKFAST and TAKE ONE TABLET BY MOUTH EVERYDAY AT BEDTIME   diclofenac sodium (VOLTAREN) 1 % GEL Apply 2 g topically daily as needed (fibromyalgia pain).   DULoxetine (CYMBALTA) 30 MG capsule Take 90 mg by mouth at bedtime.   FEROSUL 325 (65 Fe) MG tablet TAKE ONE TABLET BY MOUTH ONCE DAILY   fexofenadine (ALLEGRA) 180 MG tablet TAKE ONE TABLET BY MOUTH ONCE DAILY   fluticasone (FLONASE) 50 MCG/ACT nasal spray Place 2 sprays into both nostrils 2 (two) times daily.   furosemide (LASIX) 80 MG tablet TAKE ONE TABLET BY MOUTH TWICE DAILY   gabapentin (NEURONTIN) 300 MG capsule TAKE 1 CAPSULE(300 MG) BY MOUTH TWICE DAILY   glucose blood (ACCU-CHEK GUIDE) test strip USE TO check blood glucose UP TO four times daily AS DIRECTED   LINZESS 145 MCG CAPS capsule TAKE ONE CAPSULE BY MOUTH every other DAY   magic mouthwash SOLN Take 5 mLs by mouth 4 (four) times daily as needed for mouth pain.   meclizine (ANTIVERT) 25 MG tablet Take 1 tablet (25 mg total) by mouth 3 (three) times daily as needed for dizziness.   metFORMIN (GLUCOPHAGE) 500 MG tablet TAKE ONE TABLET BY MOUTH EVERYDAY AT BEDTIME   metoCLOPramide (REGLAN) 10 MG tablet Take 1 tablet (10 mg total) by mouth every 6 (six) hours as needed for nausea (or headache).   omeprazole (PRILOSEC) 20 MG capsule TAKE ONE CAPSULE BY MOUTH BEFORE BREAKFAST and TAKE ONE CAPSULE BY MOUTH EVERY EVENING   potassium chloride (MICRO-K) 10 MEQ CR capsule  TAKE TWO CAPSULES BY MOUTH EVERYDAY AT BEDTIME   pravastatin (PRAVACHOL) 40 MG tablet TAKE ONE TABLET BY MOUTH EVERY EVENING Needs appointment for further refills   QUEtiapine (SEROQUEL) 50 MG tablet Take 50 mg by mouth 2 (two) times daily.   spironolactone (ALDACTONE) 25 MG tablet Take 0.5 tablets (12.5 mg total) by mouth daily.   warfarin (COUMADIN) 10 MG tablet TAKE ONE TABLET BY MOUTH ONCE daily   zonisamide (ZONEGRAN) 50 MG capsule 1 TABLET AM AND 2 TABLETS PM   [DISCONTINUED] Vitamin D, Ergocalciferol, (DRISDOL) 1.25 MG (50000 UNIT) CAPS capsule Take 1 capsule (50,000 Units total) by mouth every 7 (  seven) days.   Current Facility-Administered Medications for the 10/07/21 encounter (Office Visit) with Werner Lean, MD  Medication   sodium chloride flush (NS) 0.9 % injection 3 mL    Allergies:   Lisinopril and Corticosteroids   Social History   Socioeconomic History   Marital status: Divorced    Spouse name: Not on file   Number of children: Not on file   Years of education: Not on file   Highest education level: Not on file  Occupational History   Not on file  Tobacco Use   Smoking status: Never   Smokeless tobacco: Never  Vaping Use   Vaping Use: Never used  Substance and Sexual Activity   Alcohol use: No    Alcohol/week: 0.0 standard drinks of alcohol    Comment: beer in past    Drug use: Yes    Types: Cocaine    Comment: quit 2003-rehab progam in 2005   Sexual activity: Never  Other Topics Concern   Not on file  Social History Narrative   Lives alone in apartment, drives   Social Determinants of Health   Financial Resource Strain: Low Risk  (03/02/2021)   Overall Financial Resource Strain (CARDIA)    Difficulty of Paying Living Expenses: Not hard at all  Food Insecurity: No Food Insecurity (03/02/2021)   Hunger Vital Sign    Worried About Running Out of Food in the Last Year: Never true    Ran Out of Food in the Last Year: Never true   Transportation Needs: No Transportation Needs (03/02/2021)   PRAPARE - Hydrologist (Medical): No    Lack of Transportation (Non-Medical): No  Physical Activity: Insufficiently Active (03/02/2021)   Exercise Vital Sign    Days of Exercise per Week: 3 days    Minutes of Exercise per Session: 10 min  Stress: No Stress Concern Present (03/02/2021)   [place]    Feeling of Stress : Only a little  Social Connections: Moderately Isolated (03/02/2021)   Social Connection and Isolation Panel [NHANES]    Frequency of Communication with Friends and Family: Three times a week    Frequency of Social Gatherings with Friends and Family: Three times a week    Attends Religious Services: Never    Active Member of Clubs or Organizations: Yes    Attends Archivist Meetings: Never    Marital Status: Divorced    Social: Cooks greens for folks, she completed a substance abuse program and is going to the reunion soon.  Family History: The patient's family history includes COPD in an other family member; Cancer in her mother; Diabetes in her mother; Heart failure in her father; Hyperlipidemia in an other family member; Hypertension in her father and mother. History of coronary artery disease notable for no members. History of heart failure notable for both parents. History of arrhythmia notable for no members. Denies family history of sudden cardiac death including drowning, car accidents, or unexplained deaths in the family. Mother->heart murmur  ROS:   Please see the history of present illness.    All other systems reviewed and are negative.  EKGs/Labs/Other Studies Reviewed:    The following studies were reviewed today:  EKG:  02/03/21: SR rate 80 LVH 04/13/20: SR 92 LVH 02/02/20:  SR Rate 85 LVH with secondary repolarization  Transthoracic Echocardiogram Date:05/04/20  Results:    IMPRESSIONS   1. Left ventricular ejection fraction, by estimation,  is 60 to 65%. The  left ventricle has normal function. The left ventricle has no regional  wall motion abnormalities. Left ventricular diastolic parameters are  consistent with Grade I diastolic  dysfunction (impaired relaxation).   2. Right ventricular systolic function is normal. The right ventricular  size is normal.   3. The mitral valve is normal in structure. No evidence of mitral valve  regurgitation. No evidence of mitral stenosis.   4. The aortic valve is normal in structure. Aortic valve regurgitation is  not visualized.    Cardiac Monitor: Date: 03/25/20 Results:  Patient had a minimum heart rate of 67 bpm, maximum heart rate of 164 bpm, and average heart rate of 88 bpm. Predominant underlying rhythm was sinus rhythm. One run of ventricular tachycardia occurred lasting 5 beats at longest with a max rate of 164 bpm at fastest. Thirteen runs of supraventricular tachycardia occurred lasting 14 beats at longest with a max rate of 162 bpm at fastest. Isolated PACs were rare (<1.0%), with rare couplets and triplets present. Isolated PVCs were rare (<1.0%), with rare couplets and triplets present; bigeminy and trigeminy present. No evidence of complete heart block . Triggered and diary events associated overwhelming with sinus rhythm or sinus tachycardia; once with PVC.   No malignant arrhythmias.   CardiacCT : Date:05/07/20 Results: IMPRESSION: 1. Coronary calcium score of 0. This was 1st percentile for age, sex, and race matched control. 2. Normal coronary origin with right dominance. 3. CAD-RADS 0. No evidence of CAD (0%). Consider non-atherosclerotic causes of discomfort.   IMPRESSION: 1. No acute findings in the imaged extracardiac chest. 2. Hepatic steatosis. 3. Small hiatal hernia.  Left/Right Heart Catheterizations: Date: 02/28/21 Results: Hemodynamic findings consistent with mild pulmonary  hypertension.   RA 15 RV 47/6/13 PA 49/11 (mean 27)-Mild pulmonary HTN PCWP 17    Recent Labs: 02/07/2021: Magnesium 1.8 04/21/2021: ALT 20; BUN 17; Creatinine, Ser 0.87; Hemoglobin 12.7; Platelets 316.0; Potassium 3.1; Pro B Natriuretic peptide (BNP) 25.0; Sodium 139; TSH 5.15  Recent Lipid Panel    Component Value Date/Time   CHOL 194 02/02/2020 1126   TRIG 196 (H) 02/02/2020 1126   HDL 76 02/02/2020 1126   CHOLHDL 2.6 02/02/2020 1126   VLDL 20 04/24/2014 2245   LDLCALC 89 02/02/2020 1126    Physical Exam:    VS:  BP 140/90   Pulse 85   Ht _0  (1.676 m)   Wt 283 lb (128.4 kg)   SpO2 96%   BMI 45.68 kg/m     Repeat BP: Manual R arm large cuff:  136/90  Wt Readings from Last 3 Encounters:  10/07/21 283 lb (128.4 kg)  08/11/21 289 lb 3.2 oz (131.2 kg)  07/14/21 287 lb (130.2 kg)    Gen: No distress, Morbid Obesity, female   Neck: No JVD Cardiac: No Rubs or Gallops, no murmur, regular rhythm  +2 radial pulses Respiratory: Clear to auscultation bilaterally, normal effort, normal  respiratory rate GI: Soft, nontender, non-distended  MS: Non pitting edema, improved;  moves all extremities Integument: Skin feels warm Neuro:  At time of evaluation, alert and oriented to person/place/time/situation  Psych: Normal affect, appropriate mood   ASSESSMENT:    1. Diabetes mellitus with coincident hypertension ([place])   2. Morbid obesity ([place])   3. Chronic heart failure with preserved ejection fraction ([place])   4. OSA (obstructive sleep apnea)   5. Mixed hyperlipidemia   6. Paroxysmal SVT (supraventricular tachycardia) (HCC)     PLAN:  HTN with DM Chronic Heart Failure Preserved Ejection Fraction  In the setting of Morbid Obesity, Asthma, OSA overlap - with prior medication non adherence in the setting of transition to UPSTREAM DVT on chronic AC (coumadin, no cardiac indications for [facility]) - NYHA class I, Stage C, euvolemic etiology in the setting of DM/HTN  -  Diuretic regimen: Continue Invokana, lasix to 80 mg PO BID - had offered to stopped her potassium and increase her aldactone to 25 mg PO daily; she notes hesitancy given prior UPSTREAM issues and issues with her nerve pill: - she will check her ambulatory BP with her new cuff over the next two weeks after she has taken her medication, if still with elevated BP we will increase to aldactone 25 mg PO daily, stop K and repeat BMP in a week.  HLD - no significant aortic atherosclerosis on my past -  LDL [place] < 100  Asx SVT on heart monitor - no AF; no symptomatic SVT - AV Nodal Therapy: continue diltiazem  One year me or APP    Medication Adjustments/Labs and Tests Ordered: Current medicines are reviewed at length with the patient today.  Concerns regarding medicines are outlined above.  No orders of the defined types were placed in this encounter.    No orders of the defined types were placed in this encounter.     Patient Instructions  Medication Instructions:  Your physician recommends that you continue on your current medications as directed. Please refer to the Current Medication list given to you today.  *If you need a refill on your cardiac medications before your next appointment, please call your pharmacy*   Lab Work: NONE If you have labs (blood work) drawn today and your tests are completely normal, you will receive your results only by: [place] (if you have MyChart) OR A paper copy in the mail If you have any lab test that is abnormal or we need to change your treatment, we will call you to review the results.   Testing/Procedures: NONE   Follow-Up: At [facility], you and your health needs are our priority.  As part of our continuing mission to provide you with exceptional heart care, we have created designated Provider Care Teams.  These Care Teams include your primary Cardiologist (physician) and Advanced Practice Providers (APPs -  Physician  Assistants and Nurse Practitioners) who all work together to provide you with the care you need, when you need it.  Your next appointment:   1 year(s)  The format for your next appointment:   In Person  Provider:   Werner Lean, MD     Other Instructions Please monitor your Blood pressure and heart rate as discussed with Dr. Gasper Sells.  Please call in or send readings in through my chart in 2 weeks.   Important Information About Sugar         Signed, Werner Lean, MD  10/07/2021 8:48 AM    [facility]

## 2021-10-07 ENCOUNTER — Ambulatory Visit (INDEPENDENT_AMBULATORY_CARE_PROVIDER_SITE_OTHER): Payer: Medicare Other | Admitting: Internal Medicine

## 2021-10-07 ENCOUNTER — Encounter: Payer: Self-pay | Admitting: Internal Medicine

## 2021-10-07 VITALS — BP 140/90 | HR 85 | Ht 66.0 in | Wt 283.0 lb

## 2021-10-07 DIAGNOSIS — G4733 Obstructive sleep apnea (adult) (pediatric): Secondary | ICD-10-CM | POA: Diagnosis not present

## 2021-10-07 DIAGNOSIS — I5032 Chronic diastolic (congestive) heart failure: Secondary | ICD-10-CM | POA: Diagnosis not present

## 2021-10-07 DIAGNOSIS — E785 Hyperlipidemia, unspecified: Secondary | ICD-10-CM | POA: Insufficient documentation

## 2021-10-07 DIAGNOSIS — E782 Mixed hyperlipidemia: Secondary | ICD-10-CM

## 2021-10-07 DIAGNOSIS — I10 Essential (primary) hypertension: Secondary | ICD-10-CM

## 2021-10-07 DIAGNOSIS — I471 Supraventricular tachycardia: Secondary | ICD-10-CM

## 2021-10-07 DIAGNOSIS — E119 Type 2 diabetes mellitus without complications: Secondary | ICD-10-CM

## 2021-10-07 IMAGING — MR MR LUMBAR SPINE W/O CM
4 of 5 series · 24 of 48 positions shown · non-contrast
Comparison: Previous MRI from 06/03/2016.

CLINICAL DATA: Initial evaluation for low back pain with bilateral
leg pain for 2 years. Remote history of surgery.

EXAM:
MRI LUMBAR SPINE WITHOUT CONTRAST
TECHNIQUE: Multiplanar, multisequence MR imaging of the lumbar spine was
performed. No intravenous contrast was administered.

[Series 3: T2 · sagittal · 4.0mm · 0.44mm/px · 5 of 12 slices shown (1 of 2)]
[im 1/12]
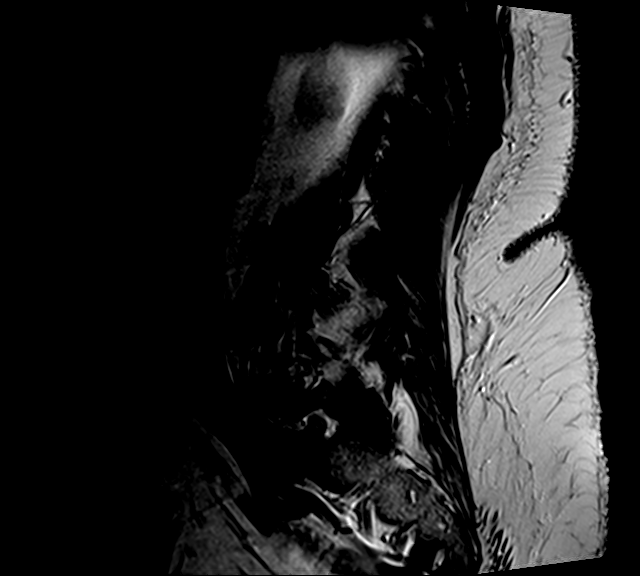
[im 3/12]
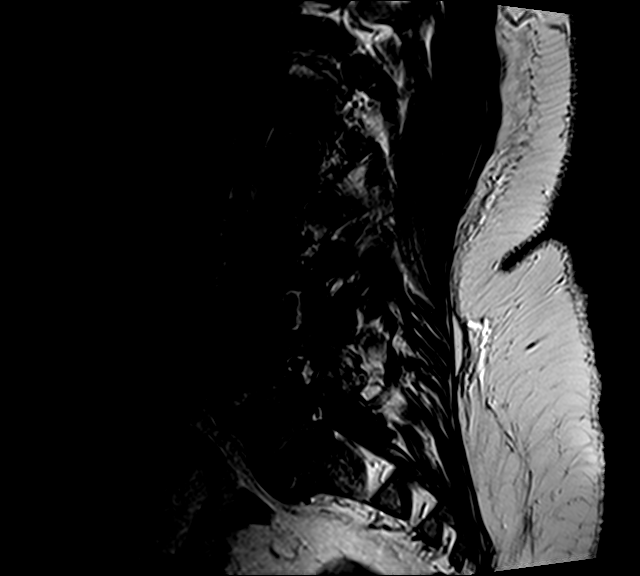
[im 6/12]
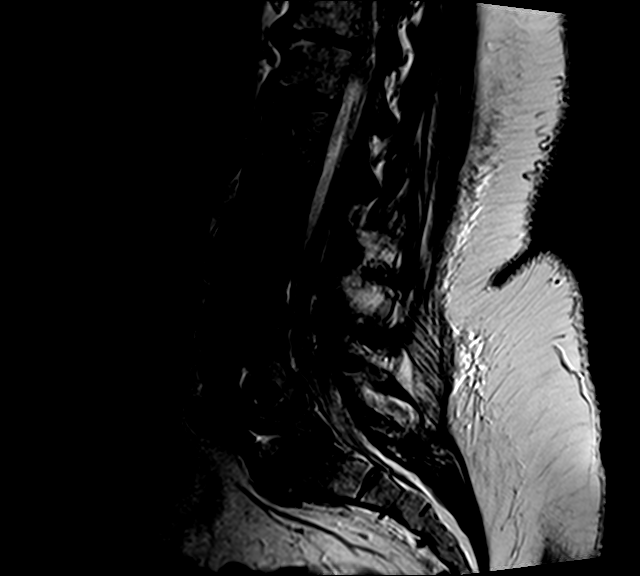
[im 9/12]
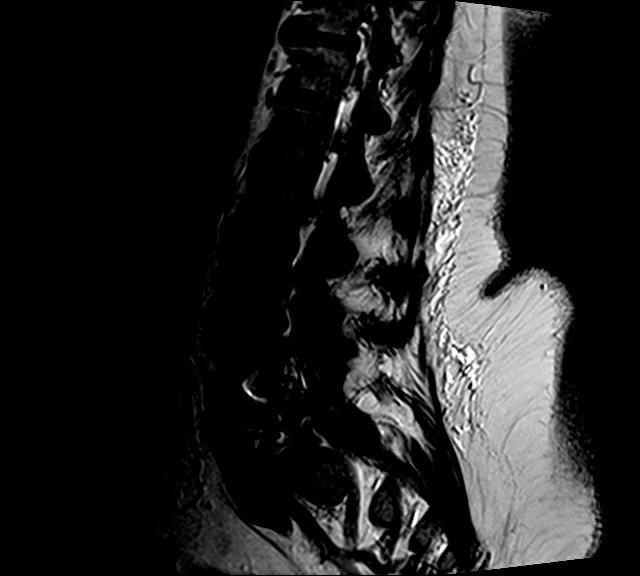
[im 12/12]
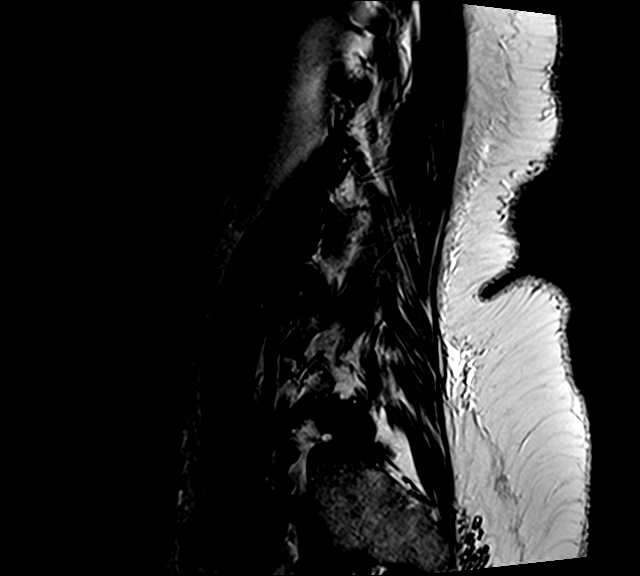

[Series 4: T1 · sagittal · 4.0mm · 0.55mm/px · 5 of 12 slices shown (1 of 2)]
[im 1/12]
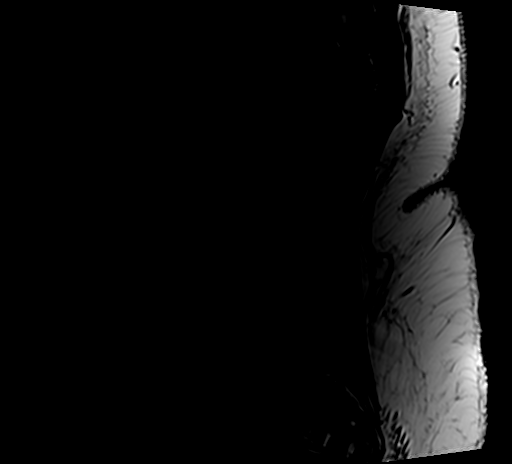
[im 3/12]
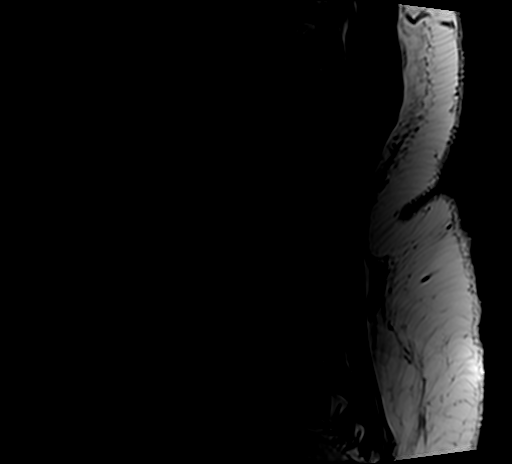
[im 6/12]
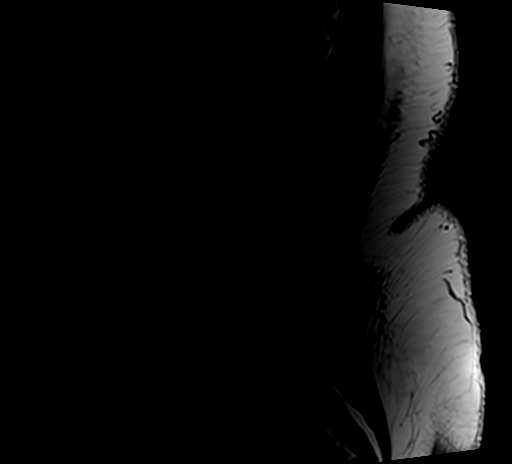
[im 9/12]
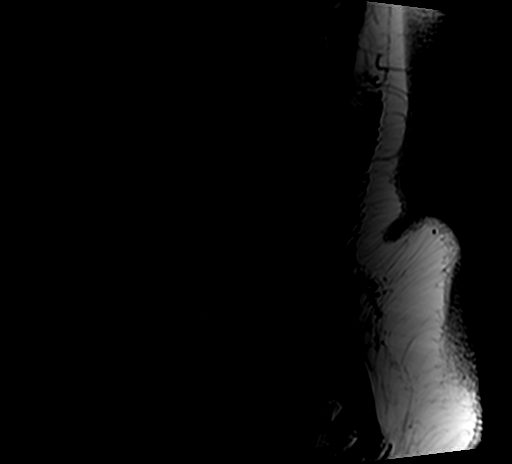
[im 12/12]
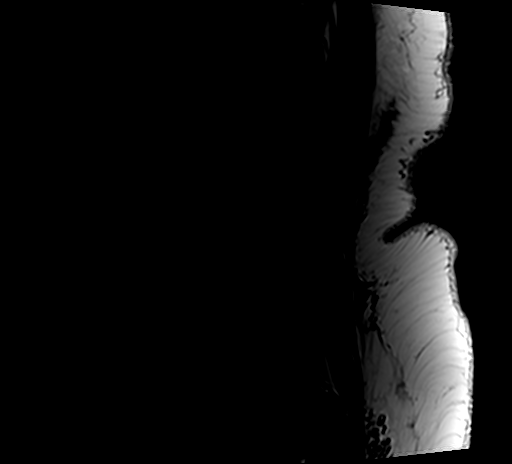

[Series 6: T1 · axial · 4.0mm · 0.37mm/px · z∈[-127,+36]mm · 4 of 33 slices shown (2 of 2)]
[im 3/33]
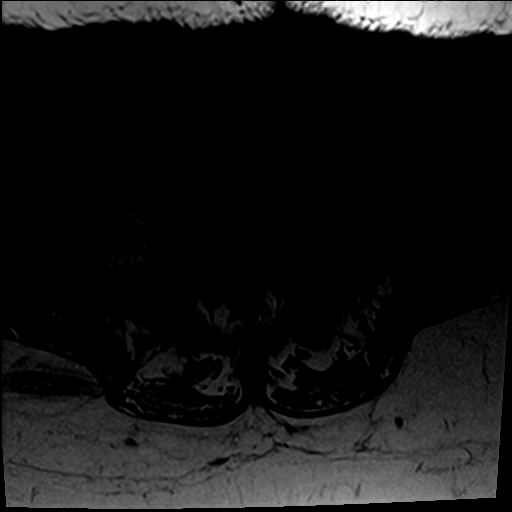
[im 5/33]
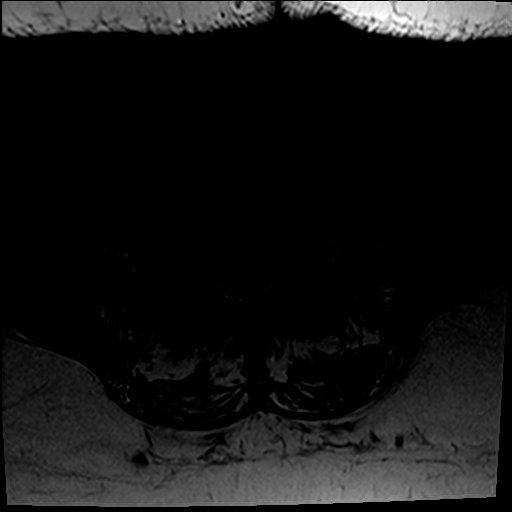
[im 18/33]
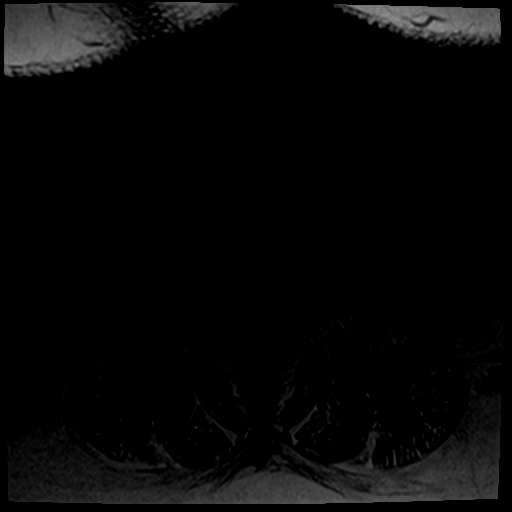
[im 28/33]
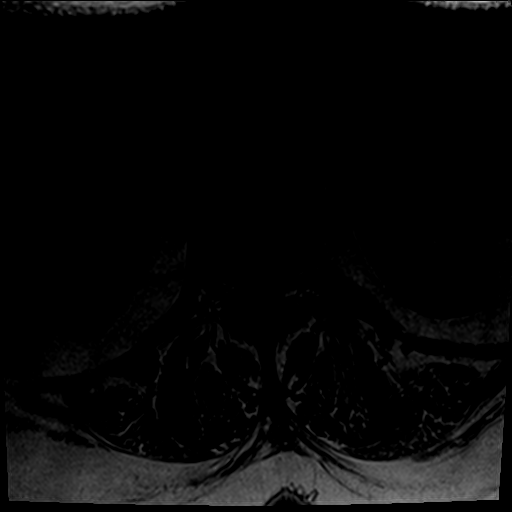

[Series 7: T2 · axial · 4.0mm · 0.74mm/px · z∈[-127,+100]mm · 10 of 33 slices shown (2 of 2)]
[im 3/33]
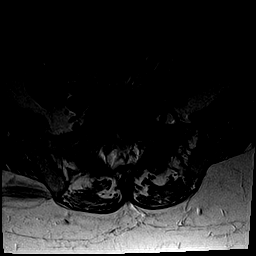
[im 5/33]
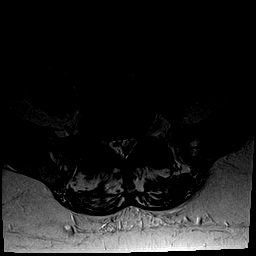
[im 7/33]
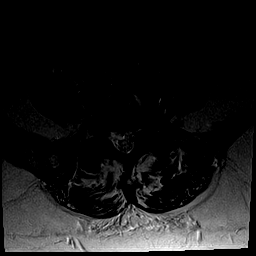
[im 11/33]
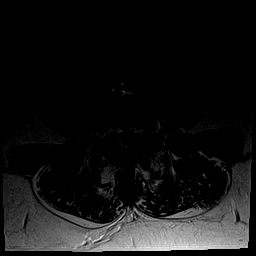
[im 15/33]
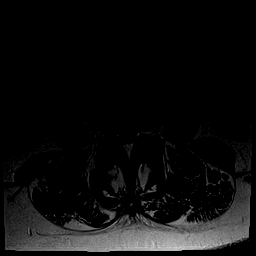
[im 18/33]
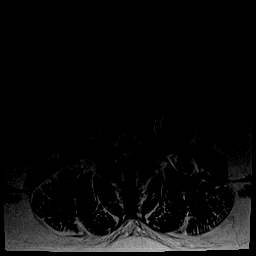
[im 20/33]
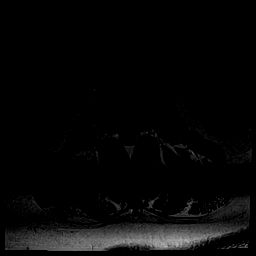
[im 24/33]
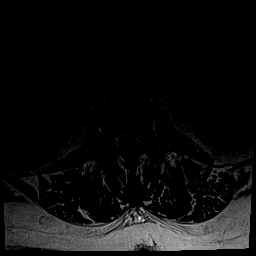
[im 28/33]
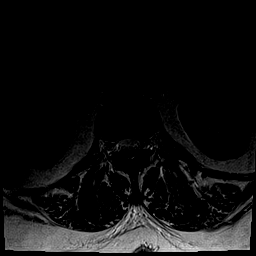
[im 33/33]
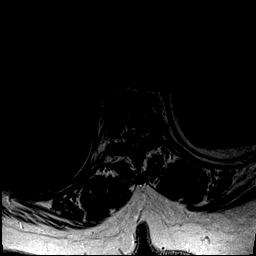

[24 of 48 positions shown; findings below may reference images not displayed]

FINDINGS: Segmentation: Standard. Lowest well-formed disc space labeled the
L5-S1 level.

Alignment: Trace chronic anterolisthesis of L4 on L5, with trace
retrolisthesis of L2 on L3, stable from previous. Alignment
otherwise normal with preservation of the normal lumbar lordosis.

Vertebrae: Susceptibility artifact from prior anterior and interbody
fusion at L4-5. Vertebral body height maintained without evidence
for acute or chronic fracture. Bone marrow signal intensity within
normal limits. No discrete or worrisome osseous lesions. No abnormal
marrow edema.

Conus medullaris and cauda equina: Conus extends to the L1 level.
Conus and cauda equina appear normal.

Paraspinal and other soft tissues: Paraspinous soft tissues within
normal limits. Visualized visceral structures unremarkable.

Disc levels:

T11-12: Diffuse disc bulge with disc desiccation and intervertebral
disc space narrowing. Reactive endplate changes. Bilateral facet
hypertrophy. Resultant mild spinal stenosis. Foramina remain patent.

T12-L1: Unremarkable.

L1-2:  Unremarkable.

L2-3: Trace retrolisthesis. Diffuse disc bulge with disc
desiccation. Mild to moderate facet hypertrophy. No significant
spinal stenosis. Foramina remain patent.

L3-4: Diffuse disc bulge with disc desiccation. Prominence of the
dorsal epidural fat. Moderate facet and ligament flavum hypertrophy.
Resultant mild-to-moderate spinal stenosis. Mild right L3 foraminal
narrowing. No significant left foraminal encroachment.

L4-5: Prior anterior and interbody fusion. Severe bilateral facet
degeneration. No significant spinal stenosis. Foramina remain
patent.

L5-S1: Mild diffuse disc bulge with disc desiccation. Severe
bilateral facet hypertrophy. No significant canal or lateral recess
stenosis. Moderate right with mild left L5 foraminal stenosis.
IMPRESSION: 1. Prior fusion at L4-5 without residual or recurrent spinal
stenosis.
2. Moderate to severe bilateral facet arthropathy at L3-4 through
L5-S1, mildly progressed relative to 3053.
3. Disc bulging with facet hypertrophy at L3-4 with resultant mild
to moderate canal and right L3 foraminal stenosis.

## 2021-10-07 NOTE — Patient Instructions (Signed)
Medication Instructions:  Your physician recommends that you continue on your current medications as directed. Please refer to the Current Medication list given to you today.  *If you need a refill on your cardiac medications before your next appointment, please call your pharmacy*   Lab Work: NONE If you have labs (blood work) drawn today and your tests are completely normal, you will receive your results only by: [place] (if you have MyChart) OR A paper copy in the mail If you have any lab test that is abnormal or we need to change your treatment, we will call you to review the results.   Testing/Procedures: NONE   Follow-Up: At [facility], you and your health needs are our priority.  As part of our continuing mission to provide you with exceptional heart care, we have created designated Provider Care Teams.  These Care Teams include your primary Cardiologist (physician) and Advanced Practice Providers (APPs -  Physician Assistants and Nurse Practitioners) who all work together to provide you with the care you need, when you need it.  Your next appointment:   1 year(s)  The format for your next appointment:   In Person  Provider:   Werner Lean, MD     Other Instructions Please monitor your Blood pressure and heart rate as discussed with Dr. Gasper Sells.  Please call in or send readings in through my chart in 2 weeks.   Important Information About Sugar

## 2021-10-13 ENCOUNTER — Inpatient Hospital Stay (HOSPITAL_BASED_OUTPATIENT_CLINIC_OR_DEPARTMENT_OTHER): Payer: Medicare Other | Admitting: Nurse Practitioner

## 2021-10-13 ENCOUNTER — Encounter: Payer: Self-pay | Admitting: Nurse Practitioner

## 2021-10-13 ENCOUNTER — Inpatient Hospital Stay: Payer: Medicare Other

## 2021-10-13 VITALS — BP 163/100 | HR 100 | Temp 98.2°F | Resp 18 | Ht 66.0 in | Wt 283.8 lb

## 2021-10-13 DIAGNOSIS — Z7901 Long term (current) use of anticoagulants: Secondary | ICD-10-CM

## 2021-10-13 DIAGNOSIS — I82403 Acute embolism and thrombosis of unspecified deep veins of lower extremity, bilateral: Secondary | ICD-10-CM | POA: Diagnosis not present

## 2021-10-13 DIAGNOSIS — Z86718 Personal history of other venous thrombosis and embolism: Secondary | ICD-10-CM | POA: Diagnosis not present

## 2021-10-13 LAB — PROTIME-INR
INR: 4.6 (ref 0.8–1.2)
Prothrombin Time: 43.1 seconds — ABNORMAL HIGH (ref 11.4–15.2)

## 2021-10-13 MED ORDER — WARFARIN SODIUM 10 MG PO TABS: ORAL_TABLET | ORAL | 1 refills | Status: DC

## 2021-10-13 NOTE — Progress Notes (Signed)
  [facility] OFFICE PROGRESS NOTE   Diagnosis: Chronic anticoagulation  INTERVAL HISTORY:   [name] returns as scheduled.  She continues Coumadin 10 mg daily.  She denies bleeding.  No signs/symptoms of a blood clot.  No recent antibiotic use.  No new medications.  No change in eating habits.  No recent illness.  Objective:  Vital signs in last 24 hours:  Blood pressure (!) 163/100, pulse 100, temperature 98.2 F (36.8 C), temperature source Oral, resp. rate 18, height '5\' 6"'$  (1.676 m), weight 283 lb 12.8 oz (128.7 kg), SpO2 99 %.    HEENT: No thrush or ulcers. Resp: Lungs clear bilaterally. Cardio: Regular rate and rhythm. GI: No hepatosplenomegaly. Vascular: No leg edema.  Left lower leg is slightly larger than the right lower leg.  Lab Results:  Lab Results  Component Value Date   WBC 5.7 04/21/2021   HGB 12.7 04/21/2021   HCT 39.4 04/21/2021   MCV 83.0 04/21/2021   PLT 316.0 04/21/2021   NEUTROABS 3.2 04/21/2021    Imaging:  No results found.  Medications: I have reviewed the patient's current medications.  Assessment/Plan: History of bilateral lower extremity deep vein thrombosis. She continues Coumadin anticoagulation. History of a positive lupus anticoagulant. History of a positive beta-2-glycoprotein IgA antibody. Chronic bilateral leg pain - negative bilateral Doppler January 2010. History of hypertension. COPD Status post hysterectomy. Remote history of cocaine abuse. History of an adrenal lesion on a CT scan in 2008 - status post a repeat CT on June 16, 2009, showing slow enlargement of the right adrenal nodule since 2006, most consistent with an adenoma. Stable on a CT 04/24/2014 History of rectal and vaginal bleeding in May 2009.   Status post carpal tunnel surgery. 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 Right knee replacement September 2011. Multiple orthopedic conditions. Placement of IVC filter preoperatively before knee  replacement. The IVC filter was retrieved 03/01/2011. Anemia. Ferritin in normal range at 82 on 08/18/2013; 61 on 07/10/2016 Status post lumbar fusion surgery October 2013 Right hydronephrosis-evaluated by Dr. Tresa Moore Left ankle swelling/pain 07/24/2016 Mild anemia noted on hospital admission 2018 Colonoscopy 04/19/2017-entire examined colon normal.  Repeat colonoscopy in 10 years for surveillance. Admission with COVID-19 infection 03/09/2019  Disposition: [name] appears unchanged.  She is maintained on chronic Coumadin anticoagulation due to a history of DVT.  Current Coumadin dose is 10 mg daily.  The PT/INR was in [place] range approximately 3 weeks ago.  Today supratherapeutic.  She will hold Coumadin today and tomorrow, resume on 10/15/2021 at same dose of 10 mg daily.  She will return for a repeat PT/INR in 2 weeks.  She understands to contact the office with any bleeding.  Next office visit in 3 months.    Ned Card ANP/GNP-BC   10/13/2021  10:05 AM

## 2021-10-13 NOTE — Progress Notes (Signed)
CRITICAL VALUE STICKER  CRITICAL VALUE: INR 4.6  RECEIVER (on-site recipient of call):Athol Bolds,RN  DATE & TIME NOTIFIED: 10/13/21 @ 0946  MESSENGER (representative from lab):Simona Huh  MD NOTIFIED: Ned Card, NP  TIME OF NOTIFICATION: [phone]  RESPONSE:

## 2021-10-17 DIAGNOSIS — G4733 Obstructive sleep apnea (adult) (pediatric): Secondary | ICD-10-CM | POA: Diagnosis not present

## 2021-10-18 ENCOUNTER — Telehealth: Payer: Self-pay | Admitting: Pharmacist

## 2021-10-18 NOTE — Chronic Care Management (AMB) (Signed)
Chronic Care Management Pharmacy Assistant   Name: [name]  MRN: [id number] DOB: 15-Oct-1955  Reason for Encounter: Medication Review / Medication Coordination Call   Recent office visits:  None  Recent consult visits:  10/13/2021 Ned Card NP (hem-onc) - Patient was seen for  Chronic anticoagulation and an additional issue. No medication changes. Follow up in 3 months.  10/07/2021 [name] MD (cardiology) - Patient was seen for Diabetes mellitus with coincident hypertension and additional issues. Discontinued Vit D. Follow up in 1 year.  Hospital visits:  None  Medications: Outpatient Encounter Medications as of 10/18/2021  Medication Sig   Accu-Chek Softclix Lancets lancets USE TO check blood glucose UP TO four times daily AS DIRECTED   albuterol (PROVENTIL) (2.5 MG/3ML) 0.083% nebulizer solution USE 1 VIAL IN NEBULIZER EVERY 6 HOURS - and as needed   albuterol (VENTOLIN HFA) 108 (90 Base) MCG/ACT inhaler Inhale 2 puffs into the lungs every 4 (four) hours as needed for shortness of breath.   blood glucose meter kit and supplies KIT Dispense based on patient and insurance preference. Use up to four times daily as directed. (FOR ICD-9 250.00, 250.01).   budesonide-formoterol (SYMBICORT) 160-4.5 MCG/ACT inhaler Inhale 2 puffs into the lungs 2 (two) times daily.   canagliflozin (INVOKANA) 100 MG TABS tablet Take 1 tablet (100 mg total) by mouth daily before breakfast.   CARTIA XT 240 MG 24 hr capsule Take 1 capsule (240 mg total) by mouth daily.   cloNIDine (CATAPRES) 0.1 MG tablet TAKE ONE TABLET BY MOUTH BEFORE BREAKFAST and TAKE ONE TABLET BY MOUTH EVERYDAY AT BEDTIME   diclofenac sodium (VOLTAREN) 1 % GEL Apply 2 g topically daily as needed (fibromyalgia pain).   DULoxetine (CYMBALTA) 30 MG capsule Take 90 mg by mouth at bedtime.   FEROSUL 325 (65 Fe) MG tablet TAKE ONE TABLET BY MOUTH ONCE DAILY   fexofenadine (ALLEGRA) 180 MG tablet TAKE ONE TABLET BY  MOUTH ONCE DAILY   fluticasone (FLONASE) 50 MCG/ACT nasal spray Place 2 sprays into both nostrils 2 (two) times daily.   furosemide (LASIX) 80 MG tablet TAKE ONE TABLET BY MOUTH TWICE DAILY   gabapentin (NEURONTIN) 300 MG capsule TAKE 1 CAPSULE(300 MG) BY MOUTH TWICE DAILY   glucose blood (ACCU-CHEK GUIDE) test strip USE TO check blood glucose UP TO four times daily AS DIRECTED   LINZESS 145 MCG CAPS capsule TAKE ONE CAPSULE BY MOUTH every other DAY   magic mouthwash SOLN Take 5 mLs by mouth 4 (four) times daily as needed for mouth pain.   meclizine (ANTIVERT) 25 MG tablet Take 1 tablet (25 mg total) by mouth 3 (three) times daily as needed for dizziness.   metFORMIN (GLUCOPHAGE) 500 MG tablet TAKE ONE TABLET BY MOUTH EVERYDAY AT BEDTIME   metoCLOPramide (REGLAN) 10 MG tablet Take 1 tablet (10 mg total) by mouth every 6 (six) hours as needed for nausea (or headache).   omeprazole (PRILOSEC) 20 MG capsule TAKE ONE CAPSULE BY MOUTH BEFORE BREAKFAST and TAKE ONE CAPSULE BY MOUTH EVERY EVENING   potassium chloride (MICRO-K) 10 MEQ CR capsule TAKE TWO CAPSULES BY MOUTH EVERYDAY AT BEDTIME   pravastatin (PRAVACHOL) 40 MG tablet TAKE ONE TABLET BY MOUTH EVERY EVENING Needs appointment for further refills   QUEtiapine (SEROQUEL) 50 MG tablet Take 50 mg by mouth 2 (two) times daily.   spironolactone (ALDACTONE) 25 MG tablet Take 0.5 tablets (12.5 mg total) by mouth daily.   warfarin (COUMADIN) 10 MG tablet  TAKE ONE TABLET BY MOUTH ONCE daily   zonisamide (ZONEGRAN) 50 MG capsule 1 TABLET AM AND 2 TABLETS PM   Facility-Administered Encounter Medications as of 10/18/2021  Medication   sodium chloride flush (NS) 0.9 % injection 3 mL   Reviewed chart for medication changes ahead of medication coordination call.  No OVs, Consults, or hospital visits since last care coordination call/Pharmacist visit. (If appropriate, list visit date, provider name)  No medication changes indicated OR if recent visit,  treatment plan here.  BP Readings from Last 3 Encounters:  10/13/21 (!) 163/100  10/07/21 140/90  08/11/21 136/90    Lab Results  Component Value Date   HGBA1C 8.4 (H) 04/21/2021     Patient obtains medications through Adherence Packaging  30 Days    Last adherence delivery included: Zonisamide 50 mg  - take 1 capsule at breakfast and 2 capsules at dinner Gabapentin 300 mg - take 1 capsule with breakfast and dinner Symbicort 160/4.5 - use 2 puff twice daily Spironolactone 25 mg - take 1/2 tablet at breakfast Furosemide 80 mg - take 1 tablet at breakfast and dinner Pravastatin 40 mg  - take 1 tablet at dinner Cartia XT 240 mg - take 1 capsule before breakfast Invokana 100 mg - take 1 tablet before breakfast Potassium CL ER 2mq -  take 2 capsules at bedtime Warfarin 10 mg - take 1 tablet daily or as directed Metformin 500 mg - take 1 tablet at bedtime Fexofenadine 180 mg once daily at breakfast  Duloxetine 30 mg  - take 3 capsules at bedtime  Quetiapine 50 mg - take 1 tablet at breakfast and bedtime  Clonidine 0.1 mg - take 1 tablet before breakfast and bedtime Accu-chek test strips Accu-chek softclix lancets Albuterol HFA 2 puffs every 4 hours as needed Linzess 145 mcg - Take one capsule every other day Omeprazole - 20 mg - take 1 capsule before breakfast and dinner   Patient declined (meds) last month:   No denied medications     Patient is due for next adherence delivery on: 10/28/2021   Called patient and reviewed medications and coordinated delivery.   This delivery to include: Zonisamide 50 mg  - take 1 capsule at breakfast and 2 capsules at dinner Gabapentin 300 mg - take 1 capsule with breakfast and dinner Symbicort 160/4.5 - use 2 puff twice daily Spironolactone 25 mg - take 1/2 tablet at breakfast Furosemide 80 mg - take 1 tablet at breakfast and dinner Pravastatin 40 mg  - take 1 tablet at dinner Cartia XT 240 mg - take 1 capsule before  breakfast Invokana 100 mg - take 1 tablet before breakfast Potassium CL ER 143m -  take 2 capsules at bedtime Warfarin 10 mg - take 1 tablet daily or as directed Metformin 500 mg - take 1 tablet at bedtime Fexofenadine 180 mg once daily at breakfast  Duloxetine 30 mg  - take 3 capsules at bedtime  Quetiapine 50 mg - take 1 tablet at breakfast and bedtime  Clonidine 0.1 mg - take 1 tablet before breakfast and bedtime Accu-chek test strips Accu-chek softclix lancets Albuterol HFA 2 puffs every 4 hours as needed Linzess 145 mcg - Take one capsule every other day Omeprazole - 20 mg - take 1 capsule before breakfast and dinner     Patient will need a short fill: No short fill needed   Coordinated acute fill: No acute fill needed.   Patient declined the following medications: No denied medications  Confirmed delivery date of 10/28/2021, advised patient that pharmacy will contact them the morning of delivery.  Care Gaps: AWV - scheduled 02/24/2022 Last BP - 163/100 on 10/13/2021 Last A1C - 8.4 on 04/21/2021 Tdap - never done Shingrix - never done Foot exam - overdue Mammogram - overdue Flu - due Pneumonia - postponed Hep C Screen - postponed  Star Rating Drugs: Invokana 100 mg - last filled 09/21/2021 30 DS at [facility] Metformin 500 mg - last filled 09/21/2021 30 DS at [facility] Pravastatin 40 mg - last filled 09/21/2021 30 DS at [place] [phone]

## 2021-10-24 DIAGNOSIS — J449 Chronic obstructive pulmonary disease, unspecified: Secondary | ICD-10-CM | POA: Diagnosis not present

## 2021-10-25 ENCOUNTER — Other Ambulatory Visit: Payer: Self-pay | Admitting: Family Medicine

## 2021-10-25 DIAGNOSIS — E876 Hypokalemia: Secondary | ICD-10-CM

## 2021-10-27 ENCOUNTER — Inpatient Hospital Stay: Payer: Medicare Other | Attending: Oncology

## 2021-10-27 ENCOUNTER — Other Ambulatory Visit: Payer: Self-pay

## 2021-10-27 ENCOUNTER — Other Ambulatory Visit: Payer: Self-pay | Admitting: *Deleted

## 2021-10-27 ENCOUNTER — Telehealth: Payer: Self-pay

## 2021-10-27 DIAGNOSIS — Z86718 Personal history of other venous thrombosis and embolism: Secondary | ICD-10-CM | POA: Insufficient documentation

## 2021-10-27 DIAGNOSIS — I82403 Acute embolism and thrombosis of unspecified deep veins of lower extremity, bilateral: Secondary | ICD-10-CM

## 2021-10-27 DIAGNOSIS — Z7901 Long term (current) use of anticoagulants: Secondary | ICD-10-CM

## 2021-10-27 LAB — PROTIME-INR
INR: 3.6 — ABNORMAL HIGH (ref 0.8–1.2)
Prothrombin Time: 35.8 seconds — ABNORMAL HIGH (ref 11.4–15.2)

## 2021-10-27 MED ORDER — WARFARIN SODIUM 7.5 MG PO TABS: ORAL_TABLET | ORAL | 0 refills | Status: DC

## 2021-10-27 NOTE — Telephone Encounter (Signed)
-----   Message from Ladell Pier, MD sent at 10/27/2021  1:20 PM EDT ----- Please call patient, the PT/INR is high again today, hold Coumadin today, then resume Coumadin at a dose of 10 mg daily except for 7.5 mg on Monday, Wednesday, and Friday, repeat PT/INR in 2-3 weeks, call for bleeding, be sure she is not taking any new medications including recent antibiotics

## 2021-10-27 NOTE — Telephone Encounter (Signed)
TC to Pt denied taking any new medication or antibiotics. Relayed message to stop taking coumadin. Pt stated she took medication already today she will stop tomorrow and resume on 10 mg dose on Saturday and Sunday. Prescription sent in to pharmacy for 7.5 mg of coumadin. Will follow up call tomorrow to make sure Pt received prescription.

## 2021-10-27 NOTE — Patient Outreach (Signed)
  Care Coordination   10/27/2021 Name: [name] MRN: [id number] DOB: 10/16/1955   Care Coordination Outreach Attempts:  An unsuccessful telephone outreach was attempted today to offer the patient information about available care coordination services as a benefit of their health plan.   Follow Up Plan:  Additional outreach attempts will be made to offer the patient care coordination information and services.   Encounter Outcome:  No Answer  Care Coordination Interventions Activated:  No   Care Coordination Interventions:  No, not indicated    Raina Mina, RN Care Management Coordinator [place] Office [phone]

## 2021-11-07 ENCOUNTER — Other Ambulatory Visit: Payer: Self-pay | Admitting: *Deleted

## 2021-11-07 NOTE — Patient Outreach (Signed)
  Care Coordination   11/07/2021 Name: [name] MRN: [id number] DOB: Apr 19, 1955   Care Coordination Outreach Attempts:  A second unsuccessful outreach was attempted today to offer the patient with information about available care coordination services as a benefit of their health plan.     Follow Up Plan:  Additional outreach attempts will be made to offer the patient care coordination information and services.   Encounter Outcome:  No Answer  Care Coordination Interventions Activated:  No   Care Coordination Interventions:  No, not indicated    Raina Mina, RN Care Management Coordinator [place] Office [phone]

## 2021-11-15 ENCOUNTER — Other Ambulatory Visit: Payer: Self-pay | Admitting: Internal Medicine

## 2021-11-15 ENCOUNTER — Other Ambulatory Visit: Payer: Self-pay | Admitting: Family Medicine

## 2021-11-15 DIAGNOSIS — E669 Obesity, unspecified: Secondary | ICD-10-CM

## 2021-11-15 DIAGNOSIS — R0982 Postnasal drip: Secondary | ICD-10-CM

## 2021-11-15 DIAGNOSIS — I10 Essential (primary) hypertension: Secondary | ICD-10-CM

## 2021-11-15 DIAGNOSIS — I5032 Chronic diastolic (congestive) heart failure: Secondary | ICD-10-CM

## 2021-11-16 ENCOUNTER — Telehealth: Payer: Self-pay | Admitting: Pharmacist

## 2021-11-16 NOTE — Chronic Care Management (AMB) (Signed)
Chronic Care Management Pharmacy Assistant   Name: [name]  MRN: [id number] DOB: [birth year]  Reason for Encounter: Medication Review / Medication Coordination Call   Recent office visits:  None  Recent consult visits:  None  Hospital visits:  None  Medications: Outpatient Encounter Medications as of 11/16/2021  Medication Sig   ACCU-CHEK GUIDE test strip USE TO check blood glucose UP TO four times daily AS DIRECTED   Accu-Chek Softclix Lancets lancets USE TO check blood glucose UP TO four times daily AS DIRECTED Needs appointment for further refills   albuterol (PROVENTIL) (2.5 MG/3ML) 0.083% nebulizer solution USE 1 VIAL IN NEBULIZER EVERY 6 HOURS - and as needed   albuterol (VENTOLIN HFA) 108 (90 Base) MCG/ACT inhaler Inhale 2 puffs into the lungs every 4 (four) hours as needed for shortness of breath.   blood glucose meter kit and supplies KIT Dispense based on patient and insurance preference. Use up to four times daily as directed. (FOR ICD-9 250.00, 250.01).   budesonide-formoterol (SYMBICORT) 160-4.5 MCG/ACT inhaler Inhale 2 puffs into the lungs 2 (two) times daily.   cloNIDine (CATAPRES) 0.1 MG tablet TAKE ONE TABLET BY MOUTH BEFORE BREAKFAST and TAKE ONE TABLET BY MOUTH EVERYDAY AT BEDTIME   diclofenac sodium (VOLTAREN) 1 % GEL Apply 2 g topically daily as needed (fibromyalgia pain).   diltiazem (CARDIZEM CD) 240 MG 24 hr capsule TAKE ONE CAPSULE BY MOUTH BEFORE BREAKFAST   DULoxetine (CYMBALTA) 30 MG capsule Take 90 mg by mouth at bedtime.   FEROSUL 325 (65 Fe) MG tablet TAKE ONE TABLET BY MOUTH ONCE DAILY   fexofenadine (ALLEGRA) 180 MG tablet TAKE ONE TABLET BY MOUTH ONCE DAILY   fluticasone (FLONASE) 50 MCG/ACT nasal spray Place 2 sprays into both nostrils 2 (two) times daily.   furosemide (LASIX) 80 MG tablet TAKE ONE TABLET BY MOUTH TWICE DAILY   gabapentin (NEURONTIN) 300 MG capsule TAKE 1 CAPSULE(300 MG) BY MOUTH TWICE DAILY   INVOKANA 100 MG TABS  tablet TAKE ONE TABLET BY MOUTH BEFORE BREAKFAST   LINZESS 145 MCG CAPS capsule TAKE ONE CAPSULE BY MOUTH every other DAY   magic mouthwash SOLN Take 5 mLs by mouth 4 (four) times daily as needed for mouth pain.   meclizine (ANTIVERT) 25 MG tablet Take 1 tablet (25 mg total) by mouth 3 (three) times daily as needed for dizziness.   metFORMIN (GLUCOPHAGE) 500 MG tablet TAKE ONE TABLET BY MOUTH EVERYDAY AT BEDTIME   metoCLOPramide (REGLAN) 10 MG tablet Take 1 tablet (10 mg total) by mouth every 6 (six) hours as needed for nausea (or headache).   omeprazole (PRILOSEC) 20 MG capsule TAKE ONE CAPSULE BY MOUTH BEFORE BREAKFAST and TAKE ONE CAPSULE BY MOUTH EVERY EVENING   potassium chloride (MICRO-K) 10 MEQ CR capsule TAKE TWO CAPSULES BY MOUTH EVERYDAY AT BEDTIME   pravastatin (PRAVACHOL) 40 MG tablet TAKE ONE TABLET BY MOUTH EVERY EVENING Needs appointment for further refills   QUEtiapine (SEROQUEL) 50 MG tablet Take 50 mg by mouth 2 (two) times daily.   spironolactone (ALDACTONE) 25 MG tablet Take 0.5 tablets (12.5 mg total) by mouth daily.   warfarin (COUMADIN) 10 MG tablet TAKE ONE TABLET BY MOUTH ONCE daily (Patient taking differently: TAKE ONE TABLET BY MOUTH ONCE daily. EXCEPT MONDAY, WEDNESDAY, FRIDAY changed 10/27/21)   warfarin (COUMADIN) 7.5 MG tablet Take 7.5 mg by mouth Monday, Wednesday, Friday   zonisamide (ZONEGRAN) 50 MG capsule 1 TABLET AM AND 2 TABLETS PM  Facility-Administered Encounter Medications as of 11/16/2021  Medication   sodium chloride flush (NS) 0.9 % injection 3 mL   Reviewed chart for medication changes ahead of medication coordination call.  BP Readings from Last 3 Encounters:  10/13/21 (!) 163/100  10/07/21 140/90  08/11/21 136/90    Lab Results  Component Value Date   HGBA1C 8.4 (H) 04/21/2021     Last adherence delivery included: Zonisamide 50 mg  - take 1 capsule at breakfast and 2 capsules at dinner Gabapentin 300 mg - take 1 capsule with breakfast and  dinner Symbicort 160/4.5 - use 2 puff twice daily Spironolactone 25 mg - take 1/2 tablet at breakfast Furosemide 80 mg - take 1 tablet at breakfast and dinner Pravastatin 40 mg  - take 1 tablet at dinner Cartia XT 240 mg - take 1 capsule before breakfast Invokana 100 mg - take 1 tablet before breakfast Potassium CL ER 81mq -  take 2 capsules at bedtime Warfarin 10 mg - take 1 tablet daily or as directed Metformin 500 mg - take 1 tablet at bedtime Fexofenadine 180 mg once daily at breakfast  Duloxetine 30 mg  - take 3 capsules at bedtime  Quetiapine 50 mg - take 1 tablet at breakfast and bedtime  Clonidine 0.1 mg - take 1 tablet before breakfast and bedtime Accu-chek test strips Accu-chek softclix lancets Albuterol HFA 2 puffs every 4 hours as needed Linzess 145 mcg - Take one capsule every other day Omeprazole - 20 mg - take 1 capsule before breakfast and dinner   Patient declined (meds) last month:   No denied medications     Patient is due for next adherence delivery on: 11/29/2021   Called patient and reviewed medications and coordinated delivery.   This delivery to include: Zonisamide 50 mg  - take 1 capsule at breakfast and 2 capsules at dinner Gabapentin 300 mg - take 1 capsule with breakfast and dinner Symbicort 160/4.5 - use 2 puff twice daily Spironolactone 25 mg - take 1/2 tablet at breakfast Furosemide 80 mg - take 1 tablet at breakfast and dinner Pravastatin 40 mg  - take 1 tablet at dinner Cartia XT 240 mg - take 1 capsule before breakfast Invokana 100 mg - take 1 tablet before breakfast Potassium CL ER 157m -  take 2 capsules at bedtime Warfarin 10 mg - take 1 tablet daily or as directed Metformin 500 mg - take 1 tablet at bedtime Fexofenadine 180 mg once daily at breakfast  Duloxetine 30 mg  - take 3 capsules at bedtime  Quetiapine 50 mg - take 1 tablet at breakfast and bedtime  Clonidine 0.1 mg - take 1 tablet before breakfast and bedtime Accu-chek test  strips Accu-chek softclix lancets Albuterol HFA 2 puffs every 4 hours as needed Linzess 145 mcg - Take one capsule every other day Omeprazole - 20 mg - take 1 capsule before breakfast and dinner Warfarin 7.5 mg - take 1 tablet Mon-Wed-Fri or as directed     Patient will need a short fill: No short fill needed   Coordinated acute fill: No acute fill needed.   Patient declined the following medications: No denied medications    Confirmed delivery date of 11/29/2021, advised patient that pharmacy will contact them the morning of delivery.    Care Gaps: AWV - scheduled 02/24/2022 Last BP - 163/100 on 10/13/2021 Last A1C - 8.4 on 04/21/2021 Tdap - never done Shingrix - never done Foot exam - overdue Covid booster - overdue  Mammogram - overdue Flu - due HGA1C - overdue Pneumonia vaccine - postponed Hep C Screen - postponed  Star Rating Drugs: Invokana 100 mg - last filled 10/25/2021 30 DS at [facility] (snippet) Metformin 500 mg - last filled 10/25/2021 30 DS at [facility] Pravastatin 40 mg - last filled 10/25/2021 30 DS at [place] [phone]

## 2021-11-17 ENCOUNTER — Other Ambulatory Visit: Payer: Self-pay | Admitting: Oncology

## 2021-11-17 ENCOUNTER — Ambulatory Visit: Payer: Self-pay

## 2021-11-17 NOTE — Patient Outreach (Signed)
  Care Coordination   11/17/2021 Name: [name] MRN: [id number] DOB: [birth year]   Care Coordination Outreach Attempts:  A third unsuccessful outreach was attempted today to offer the patient with information about available care coordination services as a benefit of their health plan.   Follow Up Plan:  No further outreach attempts will be made at this time. We have been unable to contact the patient to offer or enroll patient in care coordination services  Encounter Outcome:  No Answer  Care Coordination Interventions Activated:  No   Care Coordination Interventions:  No, not indicated    Daneen Schick, BSW, CDP Social Worker, Certified Dementia Practitioner Care Coordination [phone]

## 2021-11-18 ENCOUNTER — Telehealth: Payer: Self-pay | Admitting: *Deleted

## 2021-11-18 NOTE — Telephone Encounter (Signed)
Received refill request for warfarin 7.5 mg. Spoke with [name] to confirm dose and she reports she is taking: 7.5 mg on MTW (says only way she can remember) 10 mg all other days.

## 2021-11-24 ENCOUNTER — Telehealth: Payer: Self-pay | Admitting: *Deleted

## 2021-11-24 NOTE — Telephone Encounter (Signed)
Dr. Benay Spice prefers she take her warfarin of 7.5 mg on MWF instead of MTW. Continue 10 all other days. Left VM for patient to call back to discuss her warfarin dosing.

## 2021-11-25 NOTE — Telephone Encounter (Signed)
Notified patient that MD wants the 7.5 mg to be MWF instead of MTW. She agrees she will try harder to remember this schedule with the '10mg'$  all other days.

## 2021-11-28 DIAGNOSIS — J449 Chronic obstructive pulmonary disease, unspecified: Secondary | ICD-10-CM | POA: Diagnosis not present

## 2021-11-30 ENCOUNTER — Encounter: Payer: Self-pay | Admitting: Family Medicine

## 2021-11-30 DIAGNOSIS — H43823 Vitreomacular adhesion, bilateral: Secondary | ICD-10-CM | POA: Diagnosis not present

## 2021-11-30 DIAGNOSIS — E119 Type 2 diabetes mellitus without complications: Secondary | ICD-10-CM | POA: Diagnosis not present

## 2021-11-30 DIAGNOSIS — Z961 Presence of intraocular lens: Secondary | ICD-10-CM | POA: Diagnosis not present

## 2021-11-30 DIAGNOSIS — H04123 Dry eye syndrome of bilateral lacrimal glands: Secondary | ICD-10-CM | POA: Diagnosis not present

## 2021-11-30 LAB — HM DIABETES EYE EXAM

## 2021-12-06 ENCOUNTER — Ambulatory Visit (INDEPENDENT_AMBULATORY_CARE_PROVIDER_SITE_OTHER): Payer: Medicare Other | Admitting: Podiatry

## 2021-12-06 ENCOUNTER — Encounter: Payer: Self-pay | Admitting: Podiatry

## 2021-12-06 DIAGNOSIS — E1142 Type 2 diabetes mellitus with diabetic polyneuropathy: Secondary | ICD-10-CM | POA: Diagnosis not present

## 2021-12-06 DIAGNOSIS — E119 Type 2 diabetes mellitus without complications: Secondary | ICD-10-CM

## 2021-12-06 DIAGNOSIS — M79675 Pain in left toe(s): Secondary | ICD-10-CM

## 2021-12-06 DIAGNOSIS — M2042 Other hammer toe(s) (acquired), left foot: Secondary | ICD-10-CM | POA: Diagnosis not present

## 2021-12-06 DIAGNOSIS — M2141 Flat foot [pes planus] (acquired), right foot: Secondary | ICD-10-CM

## 2021-12-06 DIAGNOSIS — M79674 Pain in right toe(s): Secondary | ICD-10-CM | POA: Diagnosis not present

## 2021-12-06 DIAGNOSIS — M2142 Flat foot [pes planus] (acquired), left foot: Secondary | ICD-10-CM | POA: Diagnosis not present

## 2021-12-06 DIAGNOSIS — B351 Tinea unguium: Secondary | ICD-10-CM

## 2021-12-07 ENCOUNTER — Encounter: Payer: Self-pay | Admitting: Family Medicine

## 2021-12-07 ENCOUNTER — Ambulatory Visit (INDEPENDENT_AMBULATORY_CARE_PROVIDER_SITE_OTHER): Payer: Medicare Other | Admitting: Family Medicine

## 2021-12-07 VITALS — BP 152/100 | HR 80 | Temp 99.0°F | Ht 66.0 in | Wt 283.4 lb

## 2021-12-07 DIAGNOSIS — J302 Other seasonal allergic rhinitis: Secondary | ICD-10-CM

## 2021-12-07 DIAGNOSIS — H938X1 Other specified disorders of right ear: Secondary | ICD-10-CM

## 2021-12-07 DIAGNOSIS — I10 Essential (primary) hypertension: Secondary | ICD-10-CM | POA: Diagnosis not present

## 2021-12-07 NOTE — Progress Notes (Signed)
Subjective:    Patient ID: [name], female    DOB: [birth year], 66 y.o.   MRN: [id number]  Chief Complaint  Patient presents with   Foreign Body in Ear    Patient complains of the sensation of something being in the ear x1 month, worse past 2-3 days   Ear Pain    Patient complains of right ear pain and  "sensation of something being in the ear" x1 month, worse past 2-3 days     HPI Patient was seen today for acute concern.  Patient endorses sensation of something crawling in her right ear.  Patient states she uses her finger to remove wax but still has a sensation.  Taking Allegra for allergies.  Patient notes BP up-and-down.  States no longer taking spironolactone or Lasix as she thinks one of the medications made her mouth "twist up".  Patient only taking clonidine when she does not have to leave the house as it makes her sleepy.  Patient followed by cardiology.  Next appointment next year.  Patient to report BP readings to cardiology but has not called to do so.  States bp like 140s/60s-70s.  Past Medical History:  Diagnosis Date   Adrenal mass ([place]) 03/226   Benign   Arthritis    knees/multiple orthopedic conditons; lower back   Asthma    per pt   Clotting disorder (HCC)    +beta-2-glycoprotein IgA antibody   COPD (chronic obstructive pulmonary disease) (HCC)    inhalers dependent on environment   Depression    Diabetes mellitus    120s usually fasting -  dx more than 10 yrs ago   Dizziness, nonspecific    DVT (deep venous thrombosis) (HCC)    Recurrent   Fibromyalgia    GERD (gastroesophageal reflux disease)    Heart murmur    History of blood transfusion    History of cocaine abuse ([place])    Remote history    Hyperlipidemia    Hypertension    takes meds daily   Hypothyroidism    Lupus Anticoagulant Positive    On home oxygen therapy    at night   Shortness of breath    exertion or lying flat   Sleep apnea    2l of oxygen at night (as of 12/6, she used  to)    Allergies  Allergen Reactions   Lisinopril Swelling    THROAT SWELLING Pt states her throat started to "close up" after being on it for "so long."   Corticosteroids Rash    States she developed a rash on her tongue after a steroid injection in her bac    ROS General: Denies fever, chills, night sweats, changes in weight, changes in appetite HEENT: Denies headaches, ear pain, changes in vision, rhinorrhea, sore throat  +sensation of foreign body in ear CV: Denies CP, palpitations, SOB, orthopnea Pulm: Denies SOB, cough, wheezing GI: Denies abdominal pain, nausea, vomiting, diarrhea, constipation GU: Denies dysuria, hematuria, frequency, vaginal discharge Msk: Denies muscle cramps, joint pains Neuro: Denies weakness, numbness, tingling Skin: Denies rashes, bruising Psych: Denies depression, anxiety, hallucinations      Objective:    Blood pressure (!) 152/100, pulse 80, temperature 99 F (37.2 C), temperature source Oral, height '5\' 6"'$  (1.676 m), weight 283 lb 6.4 oz (128.5 kg), SpO2 98 %.  Gen. Pleasant, well-nourished, in no distress, normal affect   HEENT: [place]/AT, face symmetric, conjunctiva clear, no scleral icterus, PERRLA, EOMI, nares patent without drainage, pharynx with postnasal drainage,  no erythema or exudate.  B/l external ears, canals, and TMs normal. Lungs: no accessory muscle use, CTAB, no wheezes or rales Cardiovascular: RRR, no m/r/g, no peripheral edema Neuro:  A&Ox3, CN II-XII intact, normal gait   Wt Readings from Last 3 Encounters:  12/07/21 283 lb 6.4 oz (128.5 kg)  10/13/21 283 lb 12.8 oz (128.7 kg)  10/07/21 283 lb (128.4 kg)    Lab Results  Component Value Date   WBC 5.7 04/21/2021   HGB 12.7 04/21/2021   HCT 39.4 04/21/2021   PLT 316.0 04/21/2021   GLUCOSE 164 (H) 04/21/2021   CHOL 194 02/02/2020   TRIG 196 (H) 02/02/2020   HDL 76 02/02/2020   LDLCALC 89 02/02/2020   ALT 20 04/21/2021   AST 19 04/21/2021   NA 139 04/21/2021   K  3.1 (L) 04/21/2021   CL 101 04/21/2021   CREATININE 0.87 04/21/2021   BUN 17 04/21/2021   CO2 28 04/21/2021   TSH 5.15 04/21/2021   INR 3.6 (H) 10/27/2021   HGBA1C 8.4 (H) 04/21/2021   MICROALBUR 12.5 (H) 08/11/2020    Assessment/Plan:  Abnormal sensation in right ear -ear normal on exam -discussed possible causes including wax, allergies, dry skin. -continue to monitor  Seasonal allergies -discussed continuing Allegra or switching to a different OTC antihistamine  -can also consider using saline nasal rinse. -would avoid Flonase, etc 2/2 h/o allergy to corticosteroids.  Developed rash after back injection.  Essential hypertension -uncontrolled -discussed the importance of compliance with meds -advised to take clonidine consistently to avoid rebound HTN -pt encouraged to schedule sooner appt with Cards -lifestyle modifications -monitor bp at home -give strict precautions  F/u 1-2 months  [name], MD

## 2021-12-07 NOTE — Patient Instructions (Addendum)
Do not forget to call your heart doctor to let them know your blood pressure readings.  Also let them know you need an appointment to follow-up on blood pressure since you are not taking the spironolactone or Lasix.  Patient also noted taking the clonidine when you do not have to leave the house because it makes you sleepy.  You can try a different allergy medicine such as Claritin, Zyrtec, or Xyzal to see if you notice a difference in sensation in your ear.

## 2021-12-11 NOTE — Progress Notes (Signed)
ANNUAL DIABETIC FOOT EXAM  Subjective: [name] Perrot presents today for annual diabetic foot examination.  Patient confirms h/o diabetes.  Patient denies any h/o foot wounds.  Patient has been diagnosed with neuropathy and it is managed with gabapentin.  Last known  HgA1c was unknown. Patient did not check blood glucose this morning.  Risk factors: diabetes, h/o DVT, HTN, COPD, CHF, hyperlipidemia.  [name] is requesting diabetic shoes on today's visit.  Billie Ruddy, MD is patient's PCP. Last visit was Aug 11, 2021.  Past Medical History:  Diagnosis Date   Adrenal mass ([place]) 03/226   Benign   Arthritis    knees/multiple orthopedic conditons; lower back   Asthma    per pt   Clotting disorder (HCC)    +beta-2-glycoprotein IgA antibody   COPD (chronic obstructive pulmonary disease) (HCC)    inhalers dependent on environment   Depression    Diabetes mellitus    120s usually fasting -  dx more than 10 yrs ago   Dizziness, nonspecific    DVT (deep venous thrombosis) (HCC)    Recurrent   Fibromyalgia    GERD (gastroesophageal reflux disease)    Heart murmur    History of blood transfusion    History of cocaine abuse ([place])    Remote history    Hyperlipidemia    Hypertension    takes meds daily   Hypothyroidism    Lupus Anticoagulant Positive    On home oxygen therapy    at night   Shortness of breath    exertion or lying flat   Sleep apnea    2l of oxygen at night (as of 12/6, she used to)   Patient Active Problem List   Diagnosis Date Noted   Mixed hyperlipidemia 10/07/2021   Paroxysmal SVT (supraventricular tachycardia) ([place]) 10/07/2021   Allergic rhinitis 11/29/2020   Allergic rhinitis due to pollen 11/29/2020   Moderate persistent asthma without complication 67/67/2094   Osteoarthritis of spine with radiculopathy, lumbar region 08/24/2020   Chronic back pain 08/18/2020   Chronic heart failure with preserved ejection fraction ([place]) 02/27/2020    Obesity, diabetes, and hypertension syndrome ([place]) 02/27/2020   Vitreomacular adhesion of both eyes 02/09/2020   Pseudophakia of both eyes 02/09/2020   Chronic diastolic CHF (congestive heart failure) ([place]) 03/12/2019   Hypernatremia 03/09/2019   Suspected COVID-19 virus infection 03/09/2019   Acute encephalopathy 03/09/2019   Right sided weakness 02/20/2019   Thyroid mass 02/27/2017   Chronic pain of right knee 05/24/2016   Effusion, right knee 04/28/2016   Presence of right artificial knee joint 04/28/2016   Abnormal LFTs    Decreased sensation    Paresthesia 06/12/2014   Obstruction of kidney 04/24/2014   Diabetes type 2, uncontrolled 04/24/2014   Chronic anticoagulation 04/24/2014   Crack cocaine use 04/24/2014   Depression 04/24/2014   Fibromyalgia 04/24/2014   OSA (obstructive sleep apnea) 04/24/2014   Lupus anticoagulant positive 04/24/2014   Adrenal mass, right ([place]) 04/24/2014   Right kidney mass 04/24/2014   Hydronephrosis of right kidney 04/24/2014   Supratherapeutic INR 04/24/2014   Renal mass, right 04/24/2014   Diabetes mellitus with coincident hypertension ([place])    Right lower quadrant abdominal pain    Morbid obesity ([place]) 08/21/2013   Constipation 08/19/2013   Lumbosacral spondylosis without myelopathy 11/19/2012   Bilateral deep vein thromboses ([place]) 12/13/2011   Abdominal pain 11/05/2011   Hypokalemia 11/05/2011   Sciatica 11/05/2011   Diabetes mellitus type 2 in obese ([place])  11/05/2011   HTN (hypertension) 11/05/2011   Past Surgical History:  Procedure Laterality Date   ABDOMINAL HYSTERECTOMY  1989   Fibroids   ANTERIOR LUMBAR FUSION  01/03/2012   Procedure: ANTERIOR LUMBAR FUSION 1 LEVEL;  Surgeon: Winfield Cunas, MD;  Location: [facility];  Service: Neurosurgery;  Laterality: N/A;  Lumbar Four-Five Anterior Lumbar Interbody Fusion with Instrumentation   BACK SURGERY     cervical spine---disk disease   CARPAL TUNNEL RELEASE     Bilateral    CESAREAN SECTION     CHOLECYSTECTOMY     CYSTOSCOPY/RETROGRADE/URETEROSCOPY/STONE EXTRACTION WITH BASKET Right 04/26/2014   Procedure: CYSTOSCOPY/ RIGHT RETROGRADE/ RIGHT URETEROSCOPY/URETERAL AND RENAL PELVIS BIOPSY;  Surgeon: Alexis Frock, MD;  Location: [facility];  Service: Urology;  Laterality: Right;   gallstones removed     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   bilateral   right elbow surgery     RIGHT HEART CATH N/A 02/28/2021   Procedure: RIGHT HEART CATH;  Surgeon: Burnell Blanks, MD;  Location: [facility];  Service: Cardiovascular;  Laterality: N/A;   THYROID LOBECTOMY Right 02/27/2017   THYROID LOBECTOMY Right 02/27/2017   Procedure: RIGHT THYROID LOBECTOMY;  Surgeon: Erroll Luna, MD;  Location: [facility];  Service: General;  Laterality: Right;   TUBAL LIGATION     Current Outpatient Medications on File Prior to Visit  Medication Sig Dispense Refill   ACCU-CHEK GUIDE test strip USE TO check blood glucose UP TO four times daily AS DIRECTED 100 strip 2   Accu-Chek Softclix Lancets lancets USE TO check blood glucose UP TO four times daily AS DIRECTED Needs appointment for further refills 360 each 0   albuterol (PROVENTIL) (2.5 MG/3ML) 0.083% nebulizer solution USE 1 VIAL IN NEBULIZER EVERY 6 HOURS - and as needed 120 mL 11   albuterol (VENTOLIN HFA) 108 (90 Base) MCG/ACT inhaler Inhale 2 puffs into the lungs every 4 (four) hours as needed for shortness of breath. 8 g 11   blood glucose meter kit and supplies KIT Dispense based on patient and insurance preference. Use up to four times daily as directed. (FOR ICD-9 250.00, 250.01). 1 each 0   budesonide-formoterol (SYMBICORT) 160-4.5 MCG/ACT inhaler Inhale 2 puffs into the lungs 2 (two) times daily. 1 each 11   cloNIDine (CATAPRES) 0.1 MG tablet TAKE ONE TABLET BY MOUTH BEFORE BREAKFAST and TAKE ONE TABLET BY MOUTH EVERYDAY AT BEDTIME 60 tablet 2   diclofenac sodium (VOLTAREN) 1 % GEL Apply 2 g topically daily as needed  (fibromyalgia pain).     diltiazem (CARDIZEM CD) 240 MG 24 hr capsule TAKE ONE CAPSULE BY MOUTH BEFORE BREAKFAST 90 capsule 3   DULoxetine (CYMBALTA) 30 MG capsule Take 90 mg by mouth at bedtime.     FEROSUL 325 (65 Fe) MG tablet TAKE ONE TABLET BY MOUTH ONCE DAILY 90 tablet 0   fexofenadine (ALLEGRA) 180 MG tablet TAKE ONE TABLET BY MOUTH ONCE DAILY 30 tablet 3   fluticasone (FLONASE) 50 MCG/ACT nasal spray Place 2 sprays into both nostrils 2 (two) times daily.     furosemide (LASIX) 80 MG tablet TAKE ONE TABLET BY MOUTH TWICE DAILY 180 tablet 3   gabapentin (NEURONTIN) 300 MG capsule TAKE 1 CAPSULE(300 MG) BY MOUTH TWICE DAILY 180 capsule 2   INVOKANA 100 MG TABS tablet TAKE ONE TABLET BY MOUTH BEFORE BREAKFAST 90 tablet 3   LINZESS 145 MCG CAPS capsule TAKE ONE CAPSULE BY MOUTH every other DAY 60 capsule 1  metFORMIN (GLUCOPHAGE) 500 MG tablet TAKE ONE TABLET BY MOUTH EVERYDAY AT BEDTIME 90 tablet 3   omeprazole (PRILOSEC) 20 MG capsule TAKE ONE CAPSULE BY MOUTH BEFORE BREAKFAST and TAKE ONE CAPSULE BY MOUTH EVERY EVENING 180 capsule 1   potassium chloride (MICRO-K) 10 MEQ CR capsule TAKE TWO CAPSULES BY MOUTH EVERYDAY AT BEDTIME 60 capsule 2   pravastatin (PRAVACHOL) 40 MG tablet TAKE ONE TABLET BY MOUTH EVERY EVENING Needs appointment for further refills 30 tablet 2   QUEtiapine (SEROQUEL) 50 MG tablet Take 50 mg by mouth 2 (two) times daily.     spironolactone (ALDACTONE) 25 MG tablet Take 0.5 tablets (12.5 mg total) by mouth daily. 45 tablet 3   warfarin (COUMADIN) 10 MG tablet TAKE ONE TABLET BY MOUTH ONCE daily (Patient taking differently: TAKE ONE TABLET BY MOUTH ONCE daily. EXCEPT MONDAY, WEDNESDAY, FRIDAY changed 10/27/21) 30 tablet 1   warfarin (COUMADIN) 7.5 MG tablet TAKE ONE TABLET BY MOUTH ON Monday, Wednesday, AND Fridays 30 tablet 2   zonisamide (ZONEGRAN) 100 MG capsule Take by mouth.     zonisamide (ZONEGRAN) 50 MG capsule 1 TABLET AM AND 2 TABLETS PM     Current  Facility-Administered Medications on File Prior to Visit  Medication Dose Route Frequency Provider Last Rate Last Admin   sodium chloride flush (NS) 0.9 % injection 3 mL  3 mL Intravenous Q12H Chandrasekhar, [name] A, MD        Allergies  Allergen Reactions   Lisinopril Swelling    THROAT SWELLING Pt states her throat started to "close up" after being on it for "so long."   Corticosteroids Rash    States she developed a rash on her tongue after a steroid injection in her bac   Social History   Occupational History   Not on file  Tobacco Use   Smoking status: Never   Smokeless tobacco: Never  Vaping Use   Vaping Use: Never used  Substance and Sexual Activity   Alcohol use: No    Alcohol/week: 0.0 standard drinks of alcohol    Comment: beer in past    Drug use: Yes    Types: Cocaine    Comment: quit 2003-rehab progam in 2005   Sexual activity: Never   Family History  Problem Relation Age of Onset   Hypertension Mother    Diabetes Mother    Cancer Mother        Pancreatic   Hypertension Father    Heart failure Father    Hyperlipidemia Other    COPD Other    Immunization History  Administered Date(s) Administered   Influenza Split 01/08/2012   Influenza,inj,Quad PF,6+ Mos 12/31/2018   Influenza-Unspecified 12/18/2020   PFIZER Comirnaty(Gray Top)Covid-19 Tri-Sucrose Vaccine 11/12/2020   PFIZER(Purple Top)SARS-COV-2 Vaccination 06/30/2019, 07/21/2019   Pfizer Covid-19 Vaccine Bivalent Booster 75yr & up 02/14/2021   Pneumococcal Polysaccharide-23 01/08/2012     Review of Systems: Negative except as noted in the HPI.   Objective: There were no vitals filed for this visit.  MRaela BohlGoal is a pleasant 66y.o. female in NAD. AAO X 3.  Vascular Examination: CFT <3 seconds b/l. DP pulses faintly palpable b/l. PT pulses diminished b/l. Digital hair absent. Skin temperature gradient warm to warm b/l. Nonpitting edema b/l LE. No pain with calf compression. No  ischemia or gangrene. No cyanosis or clubbing noted b/l.    Neurological Examination: Pt has subjective symptoms of neuropathy. Protective sensation diminished with 10g monofilament b/l.  Dermatological Examination: Pedal  skin warm and supple b/l. Toenails 2-5 b/l thick, discolored, elongated with subungual debris and pain on dorsal palpation.  No open wounds b/l LE. No interdigital macerations noted b/l LE. Anonychia noted bilateral great toes. Nailbed(s) epithelialized.  No hyperkeratotic nor porokeratotic lesions present on today's visit.  Musculoskeletal Examination: Muscle strength 5/5 to b/l LE. Hammertoe(s) noted to the L 4th toe. Pes planus deformity noted bilateral LE.  Radiographs: None  Last HgA1c:      Latest Ref Rng & Units 04/21/2021   10:42 AM 02/02/2021    2:29 PM  Hemoglobin A1C  Hemoglobin-A1c 4.6 - 6.5 % 8.4  8.0    Footwear Assessment: Does the patient wear appropriate shoes? Yes. Does the patient need inserts/orthotics? Yes.  ADA Risk Categorization: High Risk  Patient has one or more of the following: Loss of protective sensation Absent pedal pulses Severe Foot deformity History of foot ulcer  Assessment: 1. Pain due to onychomycosis of toenails of both feet   2. Acquired hammertoe of left foot   3. Pes planus of both feet   4. Diabetic peripheral neuropathy associated with type 2 diabetes mellitus ([place])   5. Encounter for diabetic foot exam [facility])     Plan: -Patient was evaluated and treated. All patient's and/or POA's questions/concerns answered on today's visit. -Diabetic foot examination performed today. -Discussed diabetic shoe benefit available based on patient's diagnoses. Patient/POA would like to proceed. Order entered for one pair extra depth shoes and 3 pair total contact insoles. Patient qualifies based on diagnoses. -Toenails 2-5 bilaterally debrided in length and girth without iatrogenic bleeding with sterile nail nipper and dremel.   -Patient/POA to call should there be question/concern in the interim. Return in about 3 months (around 03/07/2022).  Marzetta Board, DPM

## 2021-12-15 ENCOUNTER — Other Ambulatory Visit: Payer: Self-pay | Admitting: Nurse Practitioner

## 2021-12-15 ENCOUNTER — Other Ambulatory Visit: Payer: Self-pay | Admitting: Family Medicine

## 2021-12-15 DIAGNOSIS — I82403 Acute embolism and thrombosis of unspecified deep veins of lower extremity, bilateral: Secondary | ICD-10-CM

## 2021-12-15 DIAGNOSIS — Z7901 Long term (current) use of anticoagulants: Secondary | ICD-10-CM

## 2021-12-15 DIAGNOSIS — K219 Gastro-esophageal reflux disease without esophagitis: Secondary | ICD-10-CM

## 2021-12-16 ENCOUNTER — Telehealth: Payer: Self-pay | Admitting: Pharmacist

## 2021-12-16 NOTE — Chronic Care Management (AMB) (Signed)
Chronic Care Management Pharmacy Assistant   Name: [name]  MRN: [id number] DOB: 1955-09-29  Reason for Encounter: Medication Review / Medication Coordination Call   Recent office visits:  12/07/2021 [name] MD - Patient was seen for Abnormal sensation in right ear and additional concerns. Discontinued Magic mouthwash, Meclizine and Reglan. Follow up in 2 months.   Recent consult visits:  12/06/2021 Acquanetta Sit DPM - Patient was seen for Pain due to onychomycosis of toenails of both feet and additional concerns. No medication changes. Follow up in 3 months.   Hospital visits:  None  Medications: Outpatient Encounter Medications as of 12/16/2021  Medication Sig   ACCU-CHEK GUIDE test strip USE TO check blood glucose UP TO four times daily AS DIRECTED   Accu-Chek Softclix Lancets lancets USE TO check blood glucose UP TO four times daily AS DIRECTED Needs appointment for further refills   albuterol (PROVENTIL) (2.5 MG/3ML) 0.083% nebulizer solution USE 1 VIAL IN NEBULIZER EVERY 6 HOURS - and as needed   albuterol (VENTOLIN HFA) 108 (90 Base) MCG/ACT inhaler Inhale 2 puffs into the lungs every 4 (four) hours as needed for shortness of breath.   blood glucose meter kit and supplies KIT Dispense based on patient and insurance preference. Use up to four times daily as directed. (FOR ICD-9 250.00, 250.01).   budesonide-formoterol (SYMBICORT) 160-4.5 MCG/ACT inhaler Inhale 2 puffs into the lungs 2 (two) times daily.   cloNIDine (CATAPRES) 0.1 MG tablet TAKE ONE TABLET BY MOUTH BEFORE BREAKFAST and TAKE ONE TABLET BY MOUTH EVERYDAY AT BEDTIME   diclofenac sodium (VOLTAREN) 1 % GEL Apply 2 g topically daily as needed (fibromyalgia pain).   diltiazem (CARDIZEM CD) 240 MG 24 hr capsule TAKE ONE CAPSULE BY MOUTH BEFORE BREAKFAST   DULoxetine (CYMBALTA) 30 MG capsule Take 90 mg by mouth at bedtime.   FEROSUL 325 (65 Fe) MG tablet TAKE ONE TABLET BY MOUTH ONCE DAILY    fexofenadine (ALLEGRA) 180 MG tablet TAKE ONE TABLET BY MOUTH ONCE DAILY   fluticasone (FLONASE) 50 MCG/ACT nasal spray Place 2 sprays into both nostrils 2 (two) times daily.   furosemide (LASIX) 80 MG tablet TAKE ONE TABLET BY MOUTH TWICE DAILY   gabapentin (NEURONTIN) 300 MG capsule TAKE 1 CAPSULE(300 MG) BY MOUTH TWICE DAILY   INVOKANA 100 MG TABS tablet TAKE ONE TABLET BY MOUTH BEFORE BREAKFAST   LINZESS 145 MCG CAPS capsule TAKE ONE CAPSULE BY MOUTH every other DAY   metFORMIN (GLUCOPHAGE) 500 MG tablet TAKE ONE TABLET BY MOUTH EVERYDAY AT BEDTIME   omeprazole (PRILOSEC) 20 MG capsule TAKE ONE CAPSULE BY MOUTH BEFORE BREAKFAST and TAKE ONE CAPSULE BY MOUTH EVERY EVENING   potassium chloride (MICRO-K) 10 MEQ CR capsule TAKE TWO CAPSULES BY MOUTH EVERYDAY AT BEDTIME   pravastatin (PRAVACHOL) 40 MG tablet TAKE ONE TABLET BY MOUTH EVERY EVENING   QUEtiapine (SEROQUEL) 50 MG tablet Take 50 mg by mouth 2 (two) times daily.   spironolactone (ALDACTONE) 25 MG tablet Take 0.5 tablets (12.5 mg total) by mouth daily.   warfarin (COUMADIN) 10 MG tablet TAKE ONE TABLET BY MOUTH ONCE daily. EXCEPT MONDAY, WEDNESDAY, FRIDAY or as directed   warfarin (COUMADIN) 7.5 MG tablet TAKE ONE TABLET BY MOUTH ON Monday, Wednesday, AND Fridays   zonisamide (ZONEGRAN) 100 MG capsule Take by mouth.   zonisamide (ZONEGRAN) 50 MG capsule 1 TABLET AM AND 2 TABLETS PM   Facility-Administered Encounter Medications as of 12/16/2021  Medication   sodium chloride  flush (NS) 0.9 % injection 3 mL   Reviewed chart for medication changes ahead of medication coordination call.  BP Readings from Last 3 Encounters:  12/07/21 (!) 152/100  10/13/21 (!) 163/100  10/07/21 140/90    Lab Results  Component Value Date   HGBA1C 8.4 (H) 04/21/2021     Patient obtains medications through Adherence Packaging  30 Days   Last adherence delivery included: Zonisamide 100 mg  - take 1 capsule at breakfast and 2 capsules at  dinner Gabapentin 300 mg - take 1 capsule with breakfast and dinner Symbicort 160/4.5 - use 2 puff twice daily Spironolactone 25 mg - take 1/2 tablet at breakfast Furosemide 80 mg - take 1 tablet at breakfast and dinner Pravastatin 40 mg  - take 1 tablet at dinner Cartia XT 240 mg - take 1 capsule before breakfast Invokana 100 mg - take 1 tablet before breakfast Potassium CL ER 50mq -  take 2 capsules at bedtime Warfarin 10 mg - take 1 tablet daily or as directed Metformin 500 mg - take 1 tablet at bedtime Fexofenadine 180 mg once daily at breakfast  Duloxetine 30 mg  - take 3 capsules at bedtime  Quetiapine 50 mg - take 1 tablet at breakfast and bedtime  Clonidine 0.1 mg - take 1 tablet before breakfast and bedtime Accu-chek test strips Accu-chek softclix lancets Albuterol HFA 2 puffs every 4 hours as needed Linzess 145 mcg - Take one capsule every other day Omeprazole - 20 mg - take 1 capsule before breakfast and dinner Warfarin 7.5 mg - take 1 tablet Mon-Wed-Fri or as directed   Patient declined (meds) last month:  No denied medications     Patient is due for next adherence delivery on: 12/28/2021   Called patient and reviewed medications and coordinated delivery.   This delivery to include: Zonisamide 50 mg  - take 1 capsule at breakfast and 2 capsules at dinner Gabapentin 300 mg - take 1 capsule with breakfast and dinner Symbicort 160/4.5 - use 2 puff twice daily Spironolactone 25 mg - take 1/2 tablet at breakfast Furosemide 80 mg - take 1 tablet at breakfast and dinner Pravastatin 40 mg  - take 1 tablet at dinner Cartia XT 240 mg - take 1 capsule before breakfast Invokana 100 mg - take 1 tablet before breakfast Potassium CL ER 185m -  take 2 capsules at bedtime Warfarin 10 mg - take 1 tablet daily or as directed Metformin 500 mg - take 1 tablet at bedtime Fexofenadine 180 mg once daily at breakfast  Duloxetine 30 mg  - take 3 capsules at bedtime  Quetiapine 50 mg -  take 1 tablet at breakfast and bedtime  Clonidine 0.1 mg - take 1 tablet before breakfast and bedtime Accu-chek test strips Accu-chek softclix lancets Albuterol HFA 2 puffs every 4 hours as needed Linzess 145 mcg - Take one capsule every other day Omeprazole - 20 mg - take 1 capsule before breakfast and dinner Warfarin 7.5 mg - take 1 tablet Mon-Wed-Fri or as directed     Patient will need a short fill: No short fill needed   Coordinated acute fill: No acute fill needed.   Patient declined the following medications: No denied medications    Confirmed delivery date of 12/28/2021, advised patient that pharmacy will contact them the morning of delivery.    Care Gaps: AWV - scheduled 02/24/2022 Last BP - 152/100 on 12/07/2021 Last A1C - 8.4 on 04/21/2021 Urine ACR - overdue HGA1C -  overdue Covid - postponed Pneumonia Vaccine - postponed Hep C Screen - postponed Mammogram - postponed Tdap - postponed Shingrix - postponed Flu - postponed  Star Rating Drugs: Invokana 100 mg - last filled 11/24/2021 30 DS at [facility] Metformin 500 mg - last filled 11/24/2021 30 DS at [facility] Pravastatin 40 mg - last filled 11/24/2021 30 DS at [facility] [phone]

## 2021-12-19 ENCOUNTER — Telehealth: Payer: Self-pay | Admitting: Pharmacist

## 2021-12-19 NOTE — Chronic Care Management (AMB) (Signed)
    Chronic Care Management Pharmacy Assistant   Name: [name]  MRN: [id number] DOB: 27-Nov-1955  12/20/2021 APPOINTMENT REMINDER  [name] was reminded to have all medications, supplements and any blood glucose and blood pressure readings available for review with [name], Pharm. D, at her telephone visit on 12/20/2021 at 8:30.  Care Gaps: AWV - scheduled 02/24/2022 Last BP - 152/100 on 12/07/2021 Last A1C - 8.4 on 04/21/2021 Urine ACR - overdue HGA1C - ovedue Covid - postponed Pneumonia Vaccine - postponed Hep C Screen - postponed Mammogram - postponed Tdap - postponed Shingrix - postponed Flu - postponed   Star Rating Drug: Invokana 100 mg - last filled 11/24/2021 30 DS at [facility] Metformin 500 mg - last filled 11/24/2021 30 DS at [facility] Pravastatin 40 mg - last filled 11/24/2021 30 DS at [facility]  Any gaps in medications fill history? No  Gennie Alma [facility]  Catering manager [phone]

## 2021-12-19 NOTE — Progress Notes (Deleted)
Chronic Care Management Pharmacy Note  12/19/2021 Name:  [name] MRN:  [id number] DOB:  March 03, 1956  Summary: LDL not at [place] < 70 A1c not at [place] < 7% BP not at [place] < 130/80  Recommendations/Changes made from today's visit: -Recommended replacing metformin with Ozempic for A1c lowering and weight loss -Consider switching Invokana 100 mg to Jardiance 25 mg due to HF indication -Recommend increasing pravastatin to 80 mg daily to lower LDL to target -Recommended purchasing an arm BP cuff   Plan: Follow up DM assessment in 3-4 weeks   Subjective: [name] is an 66 y.o. year old female who is a primary patient of Volanda Napoleon, Langley Adie, MD.  The CCM team was consulted for assistance with disease management and care coordination needs.    Engaged with patient by telephone for follow up visit in response to provider referral for pharmacy case management and/or care coordination services.   Consent to Services:  The patient was given information about Chronic Care Management services, agreed to services, and gave verbal consent prior to initiation of services.  Please see initial visit note for detailed documentation.   Patient Care Team: Billie Ruddy, MD as PCP - General (Family Medicine) Werner Lean, MD as PCP - Cardiology (Cardiology) Viona Gilmore, [facility] as Pharmacist (Pharmacist)  Recent office visits: 12/07/2021 [name] MD - Patient was seen for Abnormal sensation in right ear and additional concerns. Discontinued Magic mouthwash, Meclizine and Reglan. Follow up in 2 months.   08/11/2021 [name] MD - Patient was seen for sore throat and additional issues. Started [employer] and Fexofenadine 180 mg daily. Return if symptoms worsen or fail to improve.   Recent consult visits: 12/06/2021 Acquanetta Sit DPM - Patient was seen for Pain due to onychomycosis of toenails of both feet and additional concerns. No medication changes.  Follow up in 3 months.   10/13/2021 Ned Card NP (hem-onc) - Patient was seen for  Chronic anticoagulation and an additional issue. No medication changes. Follow up in 3 months.   10/07/2021 [name] MD (cardiology) - Patient was seen for Diabetes mellitus with coincident hypertension and additional issues. Discontinued Vit D. Follow up in 1 year.  07/14/2021 [name] MD (oncology) - Patient was seen for chronic anticoagulation. No medication changes. No follow up noted.   06/29/2021 [name] MD (Neurology - [facility]) - Patient was seen for Stroke like episode. No medication changes. No follow up noted.  Hospital visits: None in previous 6 months  Objective:  Lab Results  Component Value Date   CREATININE 0.87 04/21/2021   BUN 17 04/21/2021   GFR 69.69 04/21/2021   GFRNONAA >60 02/18/2021   GFRAA 94 03/05/2020   NA 139 04/21/2021   K 3.1 (L) 04/21/2021   CALCIUM 9.3 04/21/2021   CO2 28 04/21/2021   GLUCOSE 164 (H) 04/21/2021    Lab Results  Component Value Date/Time   HGBA1C 8.4 (H) 04/21/2021 10:42 AM   HGBA1C 8.0 (H) 02/02/2021 02:29 PM   GFR 69.69 04/21/2021 10:42 AM   GFR 78.36 02/02/2021 02:29 PM   MICROALBUR 12.5 (H) 08/11/2020 11:16 AM    Last diabetic Eye exam:  Lab Results  Component Value Date/Time   HMDIABEYEEXA No Retinopathy 11/30/2021 12:00 AM    Last diabetic Foot exam: No results found for: "HMDIABFOOTEX"   Lab Results  Component Value Date   CHOL 194 02/02/2020   HDL 76 02/02/2020   LDLCALC 89  02/02/2020   TRIG 196 (H) 02/02/2020   CHOLHDL 2.6 02/02/2020       Latest Ref Rng & Units 04/21/2021   10:42 AM 02/02/2021    2:29 PM 08/18/2020    4:27 PM  Hepatic Function  Total Protein 6.0 - 8.3 g/dL 8.5  8.1  7.7   Albumin 3.5 - 5.2 g/dL 4.3  4.3  3.6   AST 0 - 37 U/L _0 ALT 0 - 35 U/L _1 Alk Phosphatase 39 - 117 U/L 80  84  80   Total Bilirubin 0.2 - 1.2 mg/dL 0.3  0.4  0.4     Lab Results   Component Value Date/Time   TSH 5.15 04/21/2021 10:42 AM   TSH 4.23 06/07/2020 10:00 AM   FREET4 0.62 04/21/2021 10:42 AM   FREET4 0.59 (L) 06/07/2020 10:00 AM       Latest Ref Rng & Units 04/21/2021   10:42 AM 02/28/2021    8:00 AM 02/21/2021    3:32 PM  CBC  WBC 4.0 - 10.5 K/uL 5.7   7.2   Hemoglobin 12.0 - 15.0 g/dL 12.7  12.6  12.0   Hematocrit 36.0 - 46.0 % 39.4  37.0  36.6   Platelets 150.0 - 400.0 K/uL 316.0   323     Lab Results  Component Value Date/Time   VD25OH 31.30 04/21/2021 10:42 AM   VD25OH 18.14 (L) 02/02/2021 02:29 PM    Clinical ASCVD: No  The 10-year ASCVD risk score (Arnett DK, et al., 2019) is: 28.5%   Values used to calculate the score:     Age: 66 years     Sex: Female     Is Non-Hispanic African American: Yes     Diabetic: Yes     Tobacco smoker: No     Systolic Blood Pressure: 343 mmHg     Is BP treated: Yes     HDL Cholesterol: 76 mg/dL     Total Cholesterol: 194 mg/dL       12/07/2021    2:35 PM 08/11/2021   11:45 AM 03/02/2021    3:25 PM  Depression screen PHQ 2/9  Decreased Interest _2 Down, Depressed, Hopeless 0 1 1  PHQ - 2 Score _3 Altered sleeping 0 0 1  Tired, decreased energy _4 Change in appetite 1 1 0  Feeling bad or failure about yourself  0  0  Trouble concentrating 0 1 0  Moving slowly or fidgety/restless 0 0 0  Suicidal thoughts 0 0 0  PHQ-9 Score _5 Difficult doing work/chores Somewhat difficult Somewhat difficult Somewhat difficult    mMRC Dyspnea Scale mMRC Score  03/29/2021 10:45 AM 0  08/12/2019  9:21 AM 1      08/12/2019    9:19 AM  CAT Score  Total CAT Score 21     Social History   Tobacco Use  Smoking Status Never  Smokeless Tobacco Never   BP Readings from Last 3 Encounters:  12/07/21 (!) 152/100  10/13/21 (!) 163/100  10/07/21 140/90   Pulse Readings from Last 3 Encounters:  12/07/21 80  10/13/21 100  10/07/21 85   Wt Readings from Last 3 Encounters:  12/07/21 283 lb  6.4 oz (128.5 kg)  10/13/21 283 lb 12.8 oz (128.7 kg)  10/07/21 283 lb (128.4 kg)   BMI Readings from Last 3 Encounters:  12/07/21 45.74 kg/m  10/13/21 45.81 kg/m  10/07/21 45.68 kg/m    Assessment/Interventions: Review of patient past medical history, allergies, medications, health status, including review of consultants reports, laboratory and other test data, was performed as part of comprehensive evaluation and provision of chronic care management services.   SDOH:  (Social Determinants of Health) assessments and interventions performed: Yes SDOH Interventions    Flowsheet Row Clinical Support from 03/02/2021 in [place] [facility] [place] [facility] from 10/07/2020 in [place] [facility] [place] from 03/05/2020 in [place] [facility] [place] Interventions -- -- Intervention Not Indicated  Housing Interventions -- -- Intervention Not Indicated  Transportation Interventions -- Intervention Not Indicated Intervention Not Indicated  Depression Interventions/Treatment  Currently on Treatment -- PHQ2-9 Score <4 Follow-up Not Indicated  Financial Strain Interventions -- Intervention Not Indicated Intervention Not Indicated  Physical Activity Interventions -- -- Intervention Not Indicated  Stress Interventions -- -- Intervention Not Indicated  Social Connections Interventions -- -- Intervention Not Indicated       SDOH Screenings   Food Insecurity: No Food Insecurity (03/02/2021)  Housing: Low Risk  (03/02/2021)  Transportation Needs: No Transportation Needs (03/02/2021)  Alcohol Screen: Low Risk  (03/05/2020)  Depression (PHQ2-9): Low Risk  (12/07/2021)  Financial Resource Strain: Low Risk  (03/02/2021)  Physical Activity: Insufficiently Active (03/02/2021)  Social Connections: Moderately Isolated (03/02/2021)  Stress: No Stress Concern Present (03/02/2021)  Tobacco Use: Low Risk  (12/07/2021)     CCM Care Plan  Allergies  Allergen Reactions   Lisinopril Swelling    THROAT SWELLING Pt states her throat started to "close up" after being on it for "so long."   Corticosteroids Rash    States she developed a rash on her tongue after a steroid injection in her bac    Medications Reviewed Today     Reviewed by Agnes Lawrence, CMA (Certified Medical Assistant) on 12/07/21 at [place] List Status: <None>   Medication Order Taking? Sig Documenting Provider Last Dose Status Informant  ACCU-CHEK GUIDE test strip [id number] Yes USE TO check blood glucose UP TO four times daily AS DIRECTED Billie Ruddy, MD Taking Active   Accu-Chek Softclix Lancets lancets [id number] Yes USE TO check blood glucose UP TO four times daily AS DIRECTED Needs appointment for further refills Billie Ruddy, MD Taking Active   albuterol (PROVENTIL) (2.5 MG/3ML) 0.083% nebulizer solution [id number] Yes USE 1 VIAL IN NEBULIZER EVERY 6 HOURS - and as needed Billie Ruddy, MD Taking Active   albuterol (VENTOLIN HFA) 108 (90 Base) MCG/ACT inhaler [id number] Yes Inhale 2 puffs into the lungs every 4 (four) hours as needed for shortness of breath. Billie Ruddy, MD Taking Active   blood glucose meter kit and supplies KIT [id number] Yes Dispense based on patient and insurance preference. Use up to four times daily as directed. (FOR ICD-9 250.00, 250.01). Billie Ruddy, MD Taking Active   budesonide-formoterol [facility]) 160-4.5 MCG/ACT inhaler [id number] Yes Inhale 2 puffs into the lungs 2 (two) times daily. Billie Ruddy, MD Taking Active   cloNIDine (CATAPRES) 0.1 MG tablet [id number] Yes TAKE ONE TABLET BY MOUTH BEFORE BREAKFAST and TAKE ONE TABLET BY MOUTH EVERYDAY AT BEDTIME Billie Ruddy, MD Taking Active   diclofenac sodium (VOLTAREN) 1 % GEL [id number] Yes Apply 2 g topically daily as needed (fibromyalgia pain). [provider] Taking Active Child  diltiazem (CARDIZEM CD) 240 MG 24 hr  capsule [id number] Yes TAKE ONE CAPSULE BY MOUTH BEFORE BREAKFAST Billie Ruddy, MD Taking Active   DULoxetine (CYMBALTA) 30 MG capsule [id number] Yes Take 90 mg by mouth at bedtime. [provider] Taking Active   FEROSUL 325 (65 Fe) MG tablet [id number] Yes TAKE ONE TABLET BY MOUTH ONCE DAILY Billie Ruddy, MD Taking Active   fexofenadine (ALLEGRA) 180 MG tablet [id number] Yes TAKE ONE TABLET BY MOUTH ONCE DAILY Billie Ruddy, MD Taking Active   fluticasone (FLONASE) 50 MCG/ACT nasal spray [id number] Yes Place 2 sprays into both nostrils 2 (two) times daily. [provider] Taking Active   furosemide (LASIX) 80 MG tablet [id number] Yes TAKE ONE TABLET BY MOUTH TWICE DAILY Chandrasekhar, [name] A, MD Taking Active   gabapentin (NEURONTIN) 300 MG capsule [id number] Yes TAKE 1 CAPSULE(300 MG) BY MOUTH TWICE DAILY Billie Ruddy, MD Taking Active   INVOKANA 100 MG TABS tablet [id number] Yes TAKE ONE TABLET BY MOUTH BEFORE BREAKFAST Werner Lean, MD Taking Active   LINZESS 145 MCG CAPS capsule [id number] Yes TAKE ONE CAPSULE BY MOUTH every other DAY Billie Ruddy, MD Taking Active   Discontinued 12/07/21 1344 (Patient Preference)   metFORMIN (GLUCOPHAGE) 500 MG tablet [id number] Yes TAKE ONE TABLET BY MOUTH EVERYDAY AT BEDTIME Billie Ruddy, MD Taking Active   Discontinued 12/07/21 1345 (Patient Preference)   omeprazole (PRILOSEC) 20 MG capsule [id number] Yes TAKE ONE CAPSULE BY MOUTH BEFORE BREAKFAST and TAKE ONE CAPSULE BY MOUTH EVERY EVENING Billie Ruddy, MD Taking Active   potassium chloride (MICRO-K) 10 MEQ CR capsule [id number] Yes TAKE TWO CAPSULES BY MOUTH EVERYDAY AT BEDTIME Billie Ruddy, MD Taking Active   pravastatin (PRAVACHOL) 40 MG tablet [id number] Yes TAKE ONE TABLET BY MOUTH EVERY EVENING Needs appointment for further refills Billie Ruddy, MD Taking Active   QUEtiapine (SEROQUEL) 50 MG tablet [id number] Yes Take 50 mg by mouth 2 (two) times  daily. [provider] Taking Active   sodium chloride flush (NS) 0.9 % injection 3 mL [id number]   [name] A, MD  Active   spironolactone (ALDACTONE) 25 MG tablet [id number] Yes Take 0.5 tablets (12.5 mg total) by mouth daily. Werner Lean, MD Taking Active   warfarin (COUMADIN) 10 MG tablet [id number] Yes TAKE ONE TABLET BY MOUTH ONCE daily  Patient taking differently: TAKE ONE TABLET BY MOUTH ONCE daily. EXCEPT MONDAY, WEDNESDAY, FRIDAY changed 10/27/21   Owens Shark, NP Taking Active   warfarin (COUMADIN) 7.5 MG tablet [id number] Yes TAKE ONE TABLET BY MOUTH ON Monday, Wednesday, AND Fridays Ladell Pier, MD Taking Active   zonisamide [facility]) 100 MG capsule [id number] Yes Take by mouth. [provider] Taking Active   zonisamide (ZONEGRAN) 50 MG capsule [id number] Yes 1 TABLET AM AND 2 TABLETS PM [provider] Taking Active             Patient Active Problem List   Diagnosis Date Noted   Mixed hyperlipidemia 10/07/2021   Paroxysmal SVT (supraventricular tachycardia) 10/07/2021   Allergic rhinitis 11/29/2020   Allergic rhinitis due to pollen 11/29/2020   Moderate persistent asthma without complication 05/12/3610   Osteoarthritis of spine with radiculopathy, lumbar region 08/24/2020   Chronic back pain 08/18/2020   Chronic heart failure with preserved ejection fraction ([place]) 02/27/2020   Obesity, diabetes, and hypertension syndrome ([place]) 02/27/2020   Vitreomacular adhesion of both eyes 02/09/2020   Pseudophakia of both eyes 02/09/2020   Chronic diastolic CHF (congestive heart  failure) ([place]) 03/12/2019   Hypernatremia 03/09/2019   Suspected COVID-19 virus infection 03/09/2019   Acute encephalopathy 03/09/2019   Right sided weakness 02/20/2019   Thyroid mass 02/27/2017   Chronic pain of right knee 05/24/2016   Effusion, right knee 04/28/2016   Presence of right artificial knee joint 04/28/2016   Abnormal LFTs    Decreased  sensation    Paresthesia 06/12/2014   Obstruction of kidney 04/24/2014   Diabetes type 2, uncontrolled 04/24/2014   Chronic anticoagulation 04/24/2014   Crack cocaine use 04/24/2014   Depression 04/24/2014   Fibromyalgia 04/24/2014   OSA (obstructive sleep apnea) 04/24/2014   Lupus anticoagulant positive 04/24/2014   Adrenal mass, right ([place]) 04/24/2014   Right kidney mass 04/24/2014   Hydronephrosis of right kidney 04/24/2014   Supratherapeutic INR 04/24/2014   Renal mass, right 04/24/2014   Diabetes mellitus with coincident hypertension ([place])    Right lower quadrant abdominal pain    Morbid obesity ([place]) 08/21/2013   Constipation 08/19/2013   Lumbosacral spondylosis without myelopathy 11/19/2012   Bilateral deep vein thromboses ([place]) 12/13/2011   Abdominal pain 11/05/2011   Hypokalemia 11/05/2011   Sciatica 11/05/2011   Diabetes mellitus type 2 in obese ([place]) 11/05/2011   HTN (hypertension) 11/05/2011    Immunization History  Administered Date(s) Administered   Influenza Split 01/08/2012   Influenza,inj,Quad PF,6+ Mos 12/31/2018   Influenza-Unspecified 12/18/2020   PFIZER Comirnaty(Gray Top)Covid-19 Tri-Sucrose Vaccine 11/12/2020   PFIZER(Purple Top)SARS-COV-2 Vaccination 06/30/2019, 07/21/2019   Pfizer Covid-19 Vaccine Bivalent Booster 30yr & up 02/14/2021   Pneumococcal Polysaccharide-23 01/08/2012   Patient has been getting a sweet tooth and eating a lot of sweets. Patient is working on losing weight and eats the dinner where she lives so she might gain or lose weight as a result. She eats TV dinners but they are without salt, similar to meals on wheels. She reports she eats these every other night.  Patient has administered shots in her stomach with the blood clots and would not be concerned about trying a medication for weight loss and blood sugar lowering.  -schedule follow up with PCP for A1c, urine ACR and ozempic?   -  Conditions to be addressed/monitored:   Hypertension, Hyperlipidemia, Diabetes, Heart Failure, COPD, and Depression  Conditions addressed this visit: Hypertension, hyperlipidemia, diabetes  There are no care plans that you recently modified to display for this patient.      Medication Assistance: None required.  Patient affirms current coverage meets needs.  Compliance/Adherence/Medication fill history: Care Gaps: Foot exam, shingrix, tetanus, Prevnar20, Hep C screening, urine ACR, A1c, mammogram Last BP - 152/100 on 12/07/2021 Last A1C - 8.4 on 04/21/2021  [facility] Drugs: Invokana 100 mg - last filled 11/24/2021 30 DS at [facility] Metformin 500 mg - last filled 11/24/2021 30 DS at [facility] Pravastatin 40 mg - last filled 11/24/2021 30 DS at [facility]  Patient's preferred pharmacy is:  [facility] [place] [address] [id number]: [phone]: [phone]  Uses pill box? Yes Pt endorses 80% compliance  We discussed: Benefits of medication synchronization, packaging and delivery as well as enhanced pharmacist oversight with [facility]. Patient decided to: Utilize [facility] pharmacy for medication synchronization, packaging and delivery  Care Plan and Follow Up Patient Decision:  Patient agrees to Care Plan and Follow-up.  Plan: The care management team will reach out to the patient again over the next 30 days.  MJeni Salles  PharmD, [place] Pharmacist [employer] at [place] [phone]

## 2021-12-20 ENCOUNTER — Telehealth: Payer: Self-pay | Admitting: Pharmacist

## 2021-12-20 ENCOUNTER — Telehealth: Payer: Medicare Other

## 2021-12-20 ENCOUNTER — Telehealth: Payer: Self-pay | Admitting: Family Medicine

## 2021-12-20 NOTE — Telephone Encounter (Signed)
  Chronic Care Management   Outreach Note  12/20/2021 Name: Mauri Temkin Depolo MRN: [id number] DOB: [birth year]  Referred by: Billie Ruddy, MD  Patient had a phone appointment scheduled with clinical pharmacist today.  An unsuccessful telephone outreach was attempted today. The patient was referred to the pharmacist for assistance with care management and care coordination.   If possible, a message was left to return call to: [phone] or to [facility] at [facility]: [place], PharmD, [place] Pharmacist [place] [facility] [place]

## 2021-12-20 NOTE — Telephone Encounter (Signed)
Pt missed her telephone appt this morning. Pt was unavailable and would like a callback

## 2021-12-20 NOTE — Progress Notes (Unsigned)
 Chronic Care Management Pharmacy Note  12/21/2021 Name:  [name] MRN:  [id number] DOB:  12/24/1955  Summary: LDL not at [place] < 70 A1c not at [place] < 7% BP not at [place] < 130/80  Recommendations/Changes made from today's visit: -Recommended starting Ozempic for A1c lowering and weight loss -Recommend increasing pravastatin to 80 mg daily to lower LDL to target -Recommended purchasing an arm BP cuff  Plan: Scheduled PCP visit for repeat A1c Follow up BP check in in 3-4 weeks to see if patient needs office appt  Subjective: [name] is an 66 y.o. year old female who is a primary patient of Banks, Shannon R, MD.  The CCM team was consulted for assistance with disease management and care coordination needs.    Engaged with patient by telephone for follow up visit in response to provider referral for pharmacy case management and/or care coordination services.   Consent to Services:  The patient was given information about Chronic Care Management services, agreed to services, and gave verbal consent prior to initiation of services.  Please see initial visit note for detailed documentation.   Patient Care Team: Banks, Shannon R, MD as PCP - General (Family Medicine) Chandrasekhar, [name] A, MD as PCP - Cardiology (Cardiology) , Madeline G, RPH as Pharmacist (Pharmacist)  Recent office visits: 12/07/2021 Shannon Banks MD - Patient was seen for Abnormal sensation in right ear and additional concerns. Discontinued Magic mouthwash, Meclizine and Reglan. Follow up in 2 months.   08/11/2021 Shannon Banks MD - Patient was seen for sore throat and additional issues. Started Magic Mouth Wash and Fexofenadine 180 mg daily. Return if symptoms worsen or fail to improve.   Recent consult visits: 12/06/2021 Jennifer Galaway DPM - Patient was seen for Pain due to onychomycosis of toenails of both feet and additional concerns. No medication changes. Follow up in 3 months.    10/13/2021 Lisa Thomas NP (hem-onc) - Patient was seen for  Chronic anticoagulation and an additional issue. No medication changes. Follow up in 3 months.   10/07/2021 [name] Chandrasekhar MD (cardiology) - Patient was seen for Diabetes mellitus with coincident hypertension and additional issues. Discontinued Vit D. Follow up in 1 year.  07/14/2021 Gary Sherrill MD (oncology) - Patient was seen for chronic anticoagulation. No medication changes. No follow up noted.   06/29/2021 Christian Robles MD (Neurology - [place]) - Patient was seen for Stroke like episode. No medication changes. No follow up noted.  Hospital visits: None in previous 6 months  Objective:  Lab Results  Component Value Date   CREATININE 0.87 04/21/2021   BUN 17 04/21/2021   GFR 69.69 04/21/2021   GFRNONAA >60 02/18/2021   GFRAA 94 03/05/2020   NA 139 04/21/2021   K 3.1 (L) 04/21/2021   CALCIUM 9.3 04/21/2021   CO2 28 04/21/2021   GLUCOSE 164 (H) 04/21/2021    Lab Results  Component Value Date/Time   HGBA1C 8.4 (H) 04/21/2021 10:42 AM   HGBA1C 8.0 (H) 02/02/2021 02:29 PM   GFR 69.69 04/21/2021 10:42 AM   GFR 78.36 02/02/2021 02:29 PM   MICROALBUR 12.5 (H) 08/11/2020 11:16 AM    Last diabetic Eye exam:  Lab Results  Component Value Date/Time   HMDIABEYEEXA No Retinopathy 11/30/2021 12:00 AM    Last diabetic Foot exam: No results found for: "HMDIABFOOTEX"   Lab Results  Component Value Date   CHOL 194 02/02/2020   HDL 76 02/02/2020   LDLCALC 89 02/02/2020      Chronic Care Management Pharmacy Note  12/21/2021 Name:  [name] MRN:  [id number] DOB:  12/24/1955  Summary: LDL not at [place] < 70 A1c not at [place] < 7% BP not at [place] < 130/80  Recommendations/Changes made from today's visit: -Recommended starting Ozempic for A1c lowering and weight loss -Recommend increasing pravastatin to 80 mg daily to lower LDL to target -Recommended purchasing an arm BP cuff  Plan: Scheduled PCP visit for repeat A1c Follow up BP check in in 3-4 weeks to see if patient needs office appt  Subjective: [name] is an 66 y.o. year old female who is a primary patient of Banks, Shannon R, MD.  The CCM team was consulted for assistance with disease management and care coordination needs.    Engaged with patient by telephone for follow up visit in response to provider referral for pharmacy case management and/or care coordination services.   Consent to Services:  The patient was given information about Chronic Care Management services, agreed to services, and gave verbal consent prior to initiation of services.  Please see initial visit note for detailed documentation.   Patient Care Team: Banks, Shannon R, MD as PCP - General (Family Medicine) Chandrasekhar, [name] A, MD as PCP - Cardiology (Cardiology) , Madeline G, RPH as Pharmacist (Pharmacist)  Recent office visits: 12/07/2021 Shannon Banks MD - Patient was seen for Abnormal sensation in right ear and additional concerns. Discontinued Magic mouthwash, Meclizine and Reglan. Follow up in 2 months.   08/11/2021 Shannon Banks MD - Patient was seen for sore throat and additional issues. Started Magic Mouth Wash and Fexofenadine 180 mg daily. Return if symptoms worsen or fail to improve.   Recent consult visits: 12/06/2021 Jennifer Galaway DPM - Patient was seen for Pain due to onychomycosis of toenails of both feet and additional concerns. No medication changes. Follow up in 3 months.    10/13/2021 Lisa Thomas NP (hem-onc) - Patient was seen for  Chronic anticoagulation and an additional issue. No medication changes. Follow up in 3 months.   10/07/2021 [name] Chandrasekhar MD (cardiology) - Patient was seen for Diabetes mellitus with coincident hypertension and additional issues. Discontinued Vit D. Follow up in 1 year.  07/14/2021 Gary Sherrill MD (oncology) - Patient was seen for chronic anticoagulation. No medication changes. No follow up noted.   06/29/2021 Christian Robles MD (Neurology - [place]) - Patient was seen for Stroke like episode. No medication changes. No follow up noted.  Hospital visits: None in previous 6 months  Objective:  Lab Results  Component Value Date   CREATININE 0.87 04/21/2021   BUN 17 04/21/2021   GFR 69.69 04/21/2021   GFRNONAA >60 02/18/2021   GFRAA 94 03/05/2020   NA 139 04/21/2021   K 3.1 (L) 04/21/2021   CALCIUM 9.3 04/21/2021   CO2 28 04/21/2021   GLUCOSE 164 (H) 04/21/2021    Lab Results  Component Value Date/Time   HGBA1C 8.4 (H) 04/21/2021 10:42 AM   HGBA1C 8.0 (H) 02/02/2021 02:29 PM   GFR 69.69 04/21/2021 10:42 AM   GFR 78.36 02/02/2021 02:29 PM   MICROALBUR 12.5 (H) 08/11/2020 11:16 AM    Last diabetic Eye exam:  Lab Results  Component Value Date/Time   HMDIABEYEEXA No Retinopathy 11/30/2021 12:00 AM    Last diabetic Foot exam: No results found for: "HMDIABFOOTEX"   Lab Results  Component Value Date   CHOL 194 02/02/2020   HDL 76 02/02/2020   LDLCALC 89 02/02/2020      Chronic Care Management Pharmacy Note  12/21/2021 Name:  [name] MRN:  [id number] DOB:  12/24/1955  Summary: LDL not at [place] < 70 A1c not at [place] < 7% BP not at [place] < 130/80  Recommendations/Changes made from today's visit: -Recommended starting Ozempic for A1c lowering and weight loss -Recommend increasing pravastatin to 80 mg daily to lower LDL to target -Recommended purchasing an arm BP cuff  Plan: Scheduled PCP visit for repeat A1c Follow up BP check in in 3-4 weeks to see if patient needs office appt  Subjective: [name] is an 66 y.o. year old female who is a primary patient of Banks, Shannon R, MD.  The CCM team was consulted for assistance with disease management and care coordination needs.    Engaged with patient by telephone for follow up visit in response to provider referral for pharmacy case management and/or care coordination services.   Consent to Services:  The patient was given information about Chronic Care Management services, agreed to services, and gave verbal consent prior to initiation of services.  Please see initial visit note for detailed documentation.   Patient Care Team: Banks, Shannon R, MD as PCP - General (Family Medicine) Chandrasekhar, [name] A, MD as PCP - Cardiology (Cardiology) , Jesus Poplin G, RPH as Pharmacist (Pharmacist)  Recent office visits: 12/07/2021 Shannon Banks MD - Patient was seen for Abnormal sensation in right ear and additional concerns. Discontinued Magic mouthwash, Meclizine and Reglan. Follow up in 2 months.   08/11/2021 Shannon Banks MD - Patient was seen for sore throat and additional issues. Started Magic Mouth Wash and Fexofenadine 180 mg daily. Return if symptoms worsen or fail to improve.   Recent consult visits: 12/06/2021 Jennifer Galaway DPM - Patient was seen for Pain due to onychomycosis of toenails of both feet and additional concerns. No medication changes. Follow up in 3 months.    10/13/2021 Lisa Thomas NP (hem-onc) - Patient was seen for  Chronic anticoagulation and an additional issue. No medication changes. Follow up in 3 months.   10/07/2021 [name] Chandrasekhar MD (cardiology) - Patient was seen for Diabetes mellitus with coincident hypertension and additional issues. Discontinued Vit D. Follow up in 1 year.  07/14/2021 Gary Sherrill MD (oncology) - Patient was seen for chronic anticoagulation. No medication changes. No follow up noted.   06/29/2021 Christian Robles MD (Neurology - [place]) - Patient was seen for Stroke like episode. No medication changes. No follow up noted.  Hospital visits: None in previous 6 months  Objective:  Lab Results  Component Value Date   CREATININE 0.87 04/21/2021   BUN 17 04/21/2021   GFR 69.69 04/21/2021   GFRNONAA >60 02/18/2021   GFRAA 94 03/05/2020   NA 139 04/21/2021   K 3.1 (L) 04/21/2021   CALCIUM 9.3 04/21/2021   CO2 28 04/21/2021   GLUCOSE 164 (H) 04/21/2021    Lab Results  Component Value Date/Time   HGBA1C 8.4 (H) 04/21/2021 10:42 AM   HGBA1C 8.0 (H) 02/02/2021 02:29 PM   GFR 69.69 04/21/2021 10:42 AM   GFR 78.36 02/02/2021 02:29 PM   MICROALBUR 12.5 (H) 08/11/2020 11:16 AM    Last diabetic Eye exam:  Lab Results  Component Value Date/Time   HMDIABEYEEXA No Retinopathy 11/30/2021 12:00 AM    Last diabetic Foot exam: No results found for: "HMDIABFOOTEX"   Lab Results  Component Value Date   CHOL 194 02/02/2020   HDL 76 02/02/2020   LDLCALC 89 02/02/2020      Chronic Care Management Pharmacy Note  12/21/2021 Name:  [name] MRN:  [id number] DOB:  12/24/1955  Summary: LDL not at [place] < 70 A1c not at [place] < 7% BP not at [place] < 130/80  Recommendations/Changes made from today's visit: -Recommended starting Ozempic for A1c lowering and weight loss -Recommend increasing pravastatin to 80 mg daily to lower LDL to target -Recommended purchasing an arm BP cuff  Plan: Scheduled PCP visit for repeat A1c Follow up BP check in in 3-4 weeks to see if patient needs office appt  Subjective: [name] is an 66 y.o. year old female who is a primary patient of Banks, Shannon R, MD.  The CCM team was consulted for assistance with disease management and care coordination needs.    Engaged with patient by telephone for follow up visit in response to provider referral for pharmacy case management and/or care coordination services.   Consent to Services:  The patient was given information about Chronic Care Management services, agreed to services, and gave verbal consent prior to initiation of services.  Please see initial visit note for detailed documentation.   Patient Care Team: Banks, Shannon R, MD as PCP - General (Family Medicine) Chandrasekhar, [name] A, MD as PCP - Cardiology (Cardiology) , Madeline G, RPH as Pharmacist (Pharmacist)  Recent office visits: 12/07/2021 Shannon Banks MD - Patient was seen for Abnormal sensation in right ear and additional concerns. Discontinued Magic mouthwash, Meclizine and Reglan. Follow up in 2 months.   08/11/2021 Shannon Banks MD - Patient was seen for sore throat and additional issues. Started Magic Mouth Wash and Fexofenadine 180 mg daily. Return if symptoms worsen or fail to improve.   Recent consult visits: 12/06/2021 Jennifer Galaway DPM - Patient was seen for Pain due to onychomycosis of toenails of both feet and additional concerns. No medication changes. Follow up in 3 months.    10/13/2021 Lisa Thomas NP (hem-onc) - Patient was seen for  Chronic anticoagulation and an additional issue. No medication changes. Follow up in 3 months.   10/07/2021 [name] Chandrasekhar MD (cardiology) - Patient was seen for Diabetes mellitus with coincident hypertension and additional issues. Discontinued Vit D. Follow up in 1 year.  07/14/2021 Gary Sherrill MD (oncology) - Patient was seen for chronic anticoagulation. No medication changes. No follow up noted.   06/29/2021 Christian Robles MD (Neurology - [place]) - Patient was seen for Stroke like episode. No medication changes. No follow up noted.  Hospital visits: None in previous 6 months  Objective:  Lab Results  Component Value Date   CREATININE 0.87 04/21/2021   BUN 17 04/21/2021   GFR 69.69 04/21/2021   GFRNONAA >60 02/18/2021   GFRAA 94 03/05/2020   NA 139 04/21/2021   K 3.1 (L) 04/21/2021   CALCIUM 9.3 04/21/2021   CO2 28 04/21/2021   GLUCOSE 164 (H) 04/21/2021    Lab Results  Component Value Date/Time   HGBA1C 8.4 (H) 04/21/2021 10:42 AM   HGBA1C 8.0 (H) 02/02/2021 02:29 PM   GFR 69.69 04/21/2021 10:42 AM   GFR 78.36 02/02/2021 02:29 PM   MICROALBUR 12.5 (H) 08/11/2020 11:16 AM    Last diabetic Eye exam:  Lab Results  Component Value Date/Time   HMDIABEYEEXA No Retinopathy 11/30/2021 12:00 AM    Last diabetic Foot exam: No results found for: "HMDIABFOOTEX"   Lab Results  Component Value Date   CHOL 194 02/02/2020   HDL 76 02/02/2020   LDLCALC 89 02/02/2020      Chronic Care Management Pharmacy Note  12/21/2021 Name:  [name] MRN:  [id number] DOB:  12/24/1955  Summary: LDL not at [place] < 70 A1c not at [place] < 7% BP not at [place] < 130/80  Recommendations/Changes made from today's visit: -Recommended starting Ozempic for A1c lowering and weight loss -Recommend increasing pravastatin to 80 mg daily to lower LDL to target -Recommended purchasing an arm BP cuff  Plan: Scheduled PCP visit for repeat A1c Follow up BP check in in 3-4 weeks to see if patient needs office appt  Subjective: [name] is an 66 y.o. year old female who is a primary patient of Banks, Shannon R, MD.  The CCM team was consulted for assistance with disease management and care coordination needs.    Engaged with patient by telephone for follow up visit in response to provider referral for pharmacy case management and/or care coordination services.   Consent to Services:  The patient was given information about Chronic Care Management services, agreed to services, and gave verbal consent prior to initiation of services.  Please see initial visit note for detailed documentation.   Patient Care Team: Banks, Shannon R, MD as PCP - General (Family Medicine) Chandrasekhar, [name] A, MD as PCP - Cardiology (Cardiology) , Marina Desire G, RPH as Pharmacist (Pharmacist)  Recent office visits: 12/07/2021 Shannon Banks MD - Patient was seen for Abnormal sensation in right ear and additional concerns. Discontinued Magic mouthwash, Meclizine and Reglan. Follow up in 2 months.   08/11/2021 Shannon Banks MD - Patient was seen for sore throat and additional issues. Started Magic Mouth Wash and Fexofenadine 180 mg daily. Return if symptoms worsen or fail to improve.   Recent consult visits: 12/06/2021 Jennifer Galaway DPM - Patient was seen for Pain due to onychomycosis of toenails of both feet and additional concerns. No medication changes. Follow up in 3 months.    10/13/2021 Lisa Thomas NP (hem-onc) - Patient was seen for  Chronic anticoagulation and an additional issue. No medication changes. Follow up in 3 months.   10/07/2021 [name] Chandrasekhar MD (cardiology) - Patient was seen for Diabetes mellitus with coincident hypertension and additional issues. Discontinued Vit D. Follow up in 1 year.  07/14/2021 Gary Sherrill MD (oncology) - Patient was seen for chronic anticoagulation. No medication changes. No follow up noted.   06/29/2021 Christian Robles MD (Neurology - [place]) - Patient was seen for Stroke like episode. No medication changes. No follow up noted.  Hospital visits: None in previous 6 months  Objective:  Lab Results  Component Value Date   CREATININE 0.87 04/21/2021   BUN 17 04/21/2021   GFR 69.69 04/21/2021   GFRNONAA >60 02/18/2021   GFRAA 94 03/05/2020   NA 139 04/21/2021   K 3.1 (L) 04/21/2021   CALCIUM 9.3 04/21/2021   CO2 28 04/21/2021   GLUCOSE 164 (H) 04/21/2021    Lab Results  Component Value Date/Time   HGBA1C 8.4 (H) 04/21/2021 10:42 AM   HGBA1C 8.0 (H) 02/02/2021 02:29 PM   GFR 69.69 04/21/2021 10:42 AM   GFR 78.36 02/02/2021 02:29 PM   MICROALBUR 12.5 (H) 08/11/2020 11:16 AM    Last diabetic Eye exam:  Lab Results  Component Value Date/Time   HMDIABEYEEXA No Retinopathy 11/30/2021 12:00 AM    Last diabetic Foot exam: No results found for: "HMDIABFOOTEX"   Lab Results  Component Value Date   CHOL 194 02/02/2020   HDL 76 02/02/2020   LDLCALC 89 02/02/2020      Chronic Care Management Pharmacy Note  12/21/2021 Name:  [name] MRN:  [id number] DOB:  12/24/1955  Summary: LDL not at [place] < 70 A1c not at [place] < 7% BP not at [place] < 130/80  Recommendations/Changes made from today's visit: -Recommended starting Ozempic for A1c lowering and weight loss -Recommend increasing pravastatin to 80 mg daily to lower LDL to target -Recommended purchasing an arm BP cuff  Plan: Scheduled PCP visit for repeat A1c Follow up BP check in in 3-4 weeks to see if patient needs office appt  Subjective: [name] is an 66 y.o. year old female who is a primary patient of Banks, Shannon R, MD.  The CCM team was consulted for assistance with disease management and care coordination needs.    Engaged with patient by telephone for follow up visit in response to provider referral for pharmacy case management and/or care coordination services.   Consent to Services:  The patient was given information about Chronic Care Management services, agreed to services, and gave verbal consent prior to initiation of services.  Please see initial visit note for detailed documentation.   Patient Care Team: Banks, Shannon R, MD as PCP - General (Family Medicine) Chandrasekhar, [name] A, MD as PCP - Cardiology (Cardiology) , Aviannah Castoro G, RPH as Pharmacist (Pharmacist)  Recent office visits: 12/07/2021 Shannon Banks MD - Patient was seen for Abnormal sensation in right ear and additional concerns. Discontinued Magic mouthwash, Meclizine and Reglan. Follow up in 2 months.   08/11/2021 Shannon Banks MD - Patient was seen for sore throat and additional issues. Started Magic Mouth Wash and Fexofenadine 180 mg daily. Return if symptoms worsen or fail to improve.   Recent consult visits: 12/06/2021 Jennifer Galaway DPM - Patient was seen for Pain due to onychomycosis of toenails of both feet and additional concerns. No medication changes. Follow up in 3 months.    10/13/2021 Lisa Thomas NP (hem-onc) - Patient was seen for  Chronic anticoagulation and an additional issue. No medication changes. Follow up in 3 months.   10/07/2021 [name] Chandrasekhar MD (cardiology) - Patient was seen for Diabetes mellitus with coincident hypertension and additional issues. Discontinued Vit D. Follow up in 1 year.  07/14/2021 Gary Sherrill MD (oncology) - Patient was seen for chronic anticoagulation. No medication changes. No follow up noted.   06/29/2021 Christian Robles MD (Neurology - [place]) - Patient was seen for Stroke like episode. No medication changes. No follow up noted.  Hospital visits: None in previous 6 months  Objective:  Lab Results  Component Value Date   CREATININE 0.87 04/21/2021   BUN 17 04/21/2021   GFR 69.69 04/21/2021   GFRNONAA >60 02/18/2021   GFRAA 94 03/05/2020   NA 139 04/21/2021   K 3.1 (L) 04/21/2021   CALCIUM 9.3 04/21/2021   CO2 28 04/21/2021   GLUCOSE 164 (H) 04/21/2021    Lab Results  Component Value Date/Time   HGBA1C 8.4 (H) 04/21/2021 10:42 AM   HGBA1C 8.0 (H) 02/02/2021 02:29 PM   GFR 69.69 04/21/2021 10:42 AM   GFR 78.36 02/02/2021 02:29 PM   MICROALBUR 12.5 (H) 08/11/2020 11:16 AM    Last diabetic Eye exam:  Lab Results  Component Value Date/Time   HMDIABEYEEXA No Retinopathy 11/30/2021 12:00 AM    Last diabetic Foot exam: No results found for: "HMDIABFOOTEX"   Lab Results  Component Value Date   CHOL 194 02/02/2020   HDL 76 02/02/2020   LDLCALC 89 02/02/2020      Chronic Care Management Pharmacy Note  12/21/2021 Name:  [name] MRN:  [id number] DOB:  12/24/1955  Summary: LDL not at [place] < 70 A1c not at [place] < 7% BP not at [place] < 130/80  Recommendations/Changes made from today's visit: -Recommended starting Ozempic for A1c lowering and weight loss -Recommend increasing pravastatin to 80 mg daily to lower LDL to target -Recommended purchasing an arm BP cuff  Plan: Scheduled PCP visit for repeat A1c Follow up BP check in in 3-4 weeks to see if patient needs office appt  Subjective: [name] is an 66 y.o. year old female who is a primary patient of Banks, Shannon R, MD.  The CCM team was consulted for assistance with disease management and care coordination needs.    Engaged with patient by telephone for follow up visit in response to provider referral for pharmacy case management and/or care coordination services.   Consent to Services:  The patient was given information about Chronic Care Management services, agreed to services, and gave verbal consent prior to initiation of services.  Please see initial visit note for detailed documentation.   Patient Care Team: Banks, Shannon R, MD as PCP - General (Family Medicine) Chandrasekhar, [name] A, MD as PCP - Cardiology (Cardiology) , Madeline G, RPH as Pharmacist (Pharmacist)  Recent office visits: 12/07/2021 Shannon Banks MD - Patient was seen for Abnormal sensation in right ear and additional concerns. Discontinued Magic mouthwash, Meclizine and Reglan. Follow up in 2 months.   08/11/2021 Shannon Banks MD - Patient was seen for sore throat and additional issues. Started Magic Mouth Wash and Fexofenadine 180 mg daily. Return if symptoms worsen or fail to improve.   Recent consult visits: 12/06/2021 Jennifer Galaway DPM - Patient was seen for Pain due to onychomycosis of toenails of both feet and additional concerns. No medication changes. Follow up in 3 months.    10/13/2021 Lisa Thomas NP (hem-onc) - Patient was seen for  Chronic anticoagulation and an additional issue. No medication changes. Follow up in 3 months.   10/07/2021 [name] Chandrasekhar MD (cardiology) - Patient was seen for Diabetes mellitus with coincident hypertension and additional issues. Discontinued Vit D. Follow up in 1 year.  07/14/2021 Gary Sherrill MD (oncology) - Patient was seen for chronic anticoagulation. No medication changes. No follow up noted.   06/29/2021 Christian Robles MD (Neurology - [place]) - Patient was seen for Stroke like episode. No medication changes. No follow up noted.  Hospital visits: None in previous 6 months  Objective:  Lab Results  Component Value Date   CREATININE 0.87 04/21/2021   BUN 17 04/21/2021   GFR 69.69 04/21/2021   GFRNONAA >60 02/18/2021   GFRAA 94 03/05/2020   NA 139 04/21/2021   K 3.1 (L) 04/21/2021   CALCIUM 9.3 04/21/2021   CO2 28 04/21/2021   GLUCOSE 164 (H) 04/21/2021    Lab Results  Component Value Date/Time   HGBA1C 8.4 (H) 04/21/2021 10:42 AM   HGBA1C 8.0 (H) 02/02/2021 02:29 PM   GFR 69.69 04/21/2021 10:42 AM   GFR 78.36 02/02/2021 02:29 PM   MICROALBUR 12.5 (H) 08/11/2020 11:16 AM    Last diabetic Eye exam:  Lab Results  Component Value Date/Time   HMDIABEYEEXA No Retinopathy 11/30/2021 12:00 AM    Last diabetic Foot exam: No results found for: "HMDIABFOOTEX"   Lab Results  Component Value Date   CHOL 194 02/02/2020   HDL 76 02/02/2020   LDLCALC 89 02/02/2020      Chronic Care Management Pharmacy Note  12/21/2021 Name:  [name] MRN:  [id number] DOB:  12/24/1955  Summary: LDL not at [place] < 70 A1c not at [place] < 7% BP not at [place] < 130/80  Recommendations/Changes made from today's visit: -Recommended starting Ozempic for A1c lowering and weight loss -Recommend increasing pravastatin to 80 mg daily to lower LDL to target -Recommended purchasing an arm BP cuff  Plan: Scheduled PCP visit for repeat A1c Follow up BP check in in 3-4 weeks to see if patient needs office appt  Subjective: [name] is an 66 y.o. year old female who is a primary patient of Banks, Shannon R, MD.  The CCM team was consulted for assistance with disease management and care coordination needs.    Engaged with patient by telephone for follow up visit in response to provider referral for pharmacy case management and/or care coordination services.   Consent to Services:  The patient was given information about Chronic Care Management services, agreed to services, and gave verbal consent prior to initiation of services.  Please see initial visit note for detailed documentation.   Patient Care Team: Banks, Shannon R, MD as PCP - General (Family Medicine) Chandrasekhar, [name] A, MD as PCP - Cardiology (Cardiology) , Brenae Lasecki G, RPH as Pharmacist (Pharmacist)  Recent office visits: 12/07/2021 Shannon Banks MD - Patient was seen for Abnormal sensation in right ear and additional concerns. Discontinued Magic mouthwash, Meclizine and Reglan. Follow up in 2 months.   08/11/2021 Shannon Banks MD - Patient was seen for sore throat and additional issues. Started Magic Mouth Wash and Fexofenadine 180 mg daily. Return if symptoms worsen or fail to improve.   Recent consult visits: 12/06/2021 Jennifer Galaway DPM - Patient was seen for Pain due to onychomycosis of toenails of both feet and additional concerns. No medication changes. Follow up in 3 months.    10/13/2021 Lisa Thomas NP (hem-onc) - Patient was seen for  Chronic anticoagulation and an additional issue. No medication changes. Follow up in 3 months.   10/07/2021 [name] Chandrasekhar MD (cardiology) - Patient was seen for Diabetes mellitus with coincident hypertension and additional issues. Discontinued Vit D. Follow up in 1 year.  07/14/2021 Gary Sherrill MD (oncology) - Patient was seen for chronic anticoagulation. No medication changes. No follow up noted.   06/29/2021 Christian Robles MD (Neurology - [facility]) - Patient was seen for Stroke like episode. No medication changes. No follow up noted.  Hospital visits: None in previous 6 months  Objective:  Lab Results  Component Value Date   CREATININE 0.87 04/21/2021   BUN 17 04/21/2021   GFR 69.69 04/21/2021   GFRNONAA >60 02/18/2021   GFRAA 94 03/05/2020   NA 139 04/21/2021   K 3.1 (L) 04/21/2021   CALCIUM 9.3 04/21/2021   CO2 28 04/21/2021   GLUCOSE 164 (H) 04/21/2021    Lab Results  Component Value Date/Time   HGBA1C 8.4 (H) 04/21/2021 10:42 AM   HGBA1C 8.0 (H) 02/02/2021 02:29 PM   GFR 69.69 04/21/2021 10:42 AM   GFR 78.36 02/02/2021 02:29 PM   MICROALBUR 12.5 (H) 08/11/2020 11:16 AM    Last diabetic Eye exam:  Lab Results  Component Value Date/Time   HMDIABEYEEXA No Retinopathy 11/30/2021 12:00 AM    Last diabetic Foot exam: No results found for: "HMDIABFOOTEX"   Lab Results  Component Value Date   CHOL 194 02/02/2020   HDL 76 02/02/2020   LDLCALC 89 02/02/2020  

## 2021-12-20 NOTE — Telephone Encounter (Signed)
This was a visit with CCM pharmacist and it was taken care of. Patient was rescheduled to tomorrow. Please disregard.

## 2021-12-21 ENCOUNTER — Ambulatory Visit: Payer: Medicare Other | Admitting: Pharmacist

## 2021-12-21 DIAGNOSIS — I10 Essential (primary) hypertension: Secondary | ICD-10-CM

## 2021-12-21 DIAGNOSIS — E1169 Type 2 diabetes mellitus with other specified complication: Secondary | ICD-10-CM

## 2021-12-21 NOTE — Patient Instructions (Signed)
Hi Racheal,  It was great to catch up again! Please work on getting a new arm blood pressure cuff like we discussed.  Don't forget to ask Dr. Volanda Napoleon about starting on Ozempic when you see her.  Please reach out to me if you have any questions or need anything before our follow up!  Best, Maddie  [name], PharmD, [facility] Pharmacist [facility]

## 2021-12-28 DIAGNOSIS — J449 Chronic obstructive pulmonary disease, unspecified: Secondary | ICD-10-CM | POA: Diagnosis not present

## 2021-12-30 IMAGING — DX DG CHEST 2V
2 series · 2 of 2 positions shown · non-contrast
Comparison: 03/09/2019 and CT chest 11/23/2008.

CLINICAL DATA: Shortness of breath, COPD exacerbation.

EXAM:
CHEST - 2 VIEW

[chest pa]
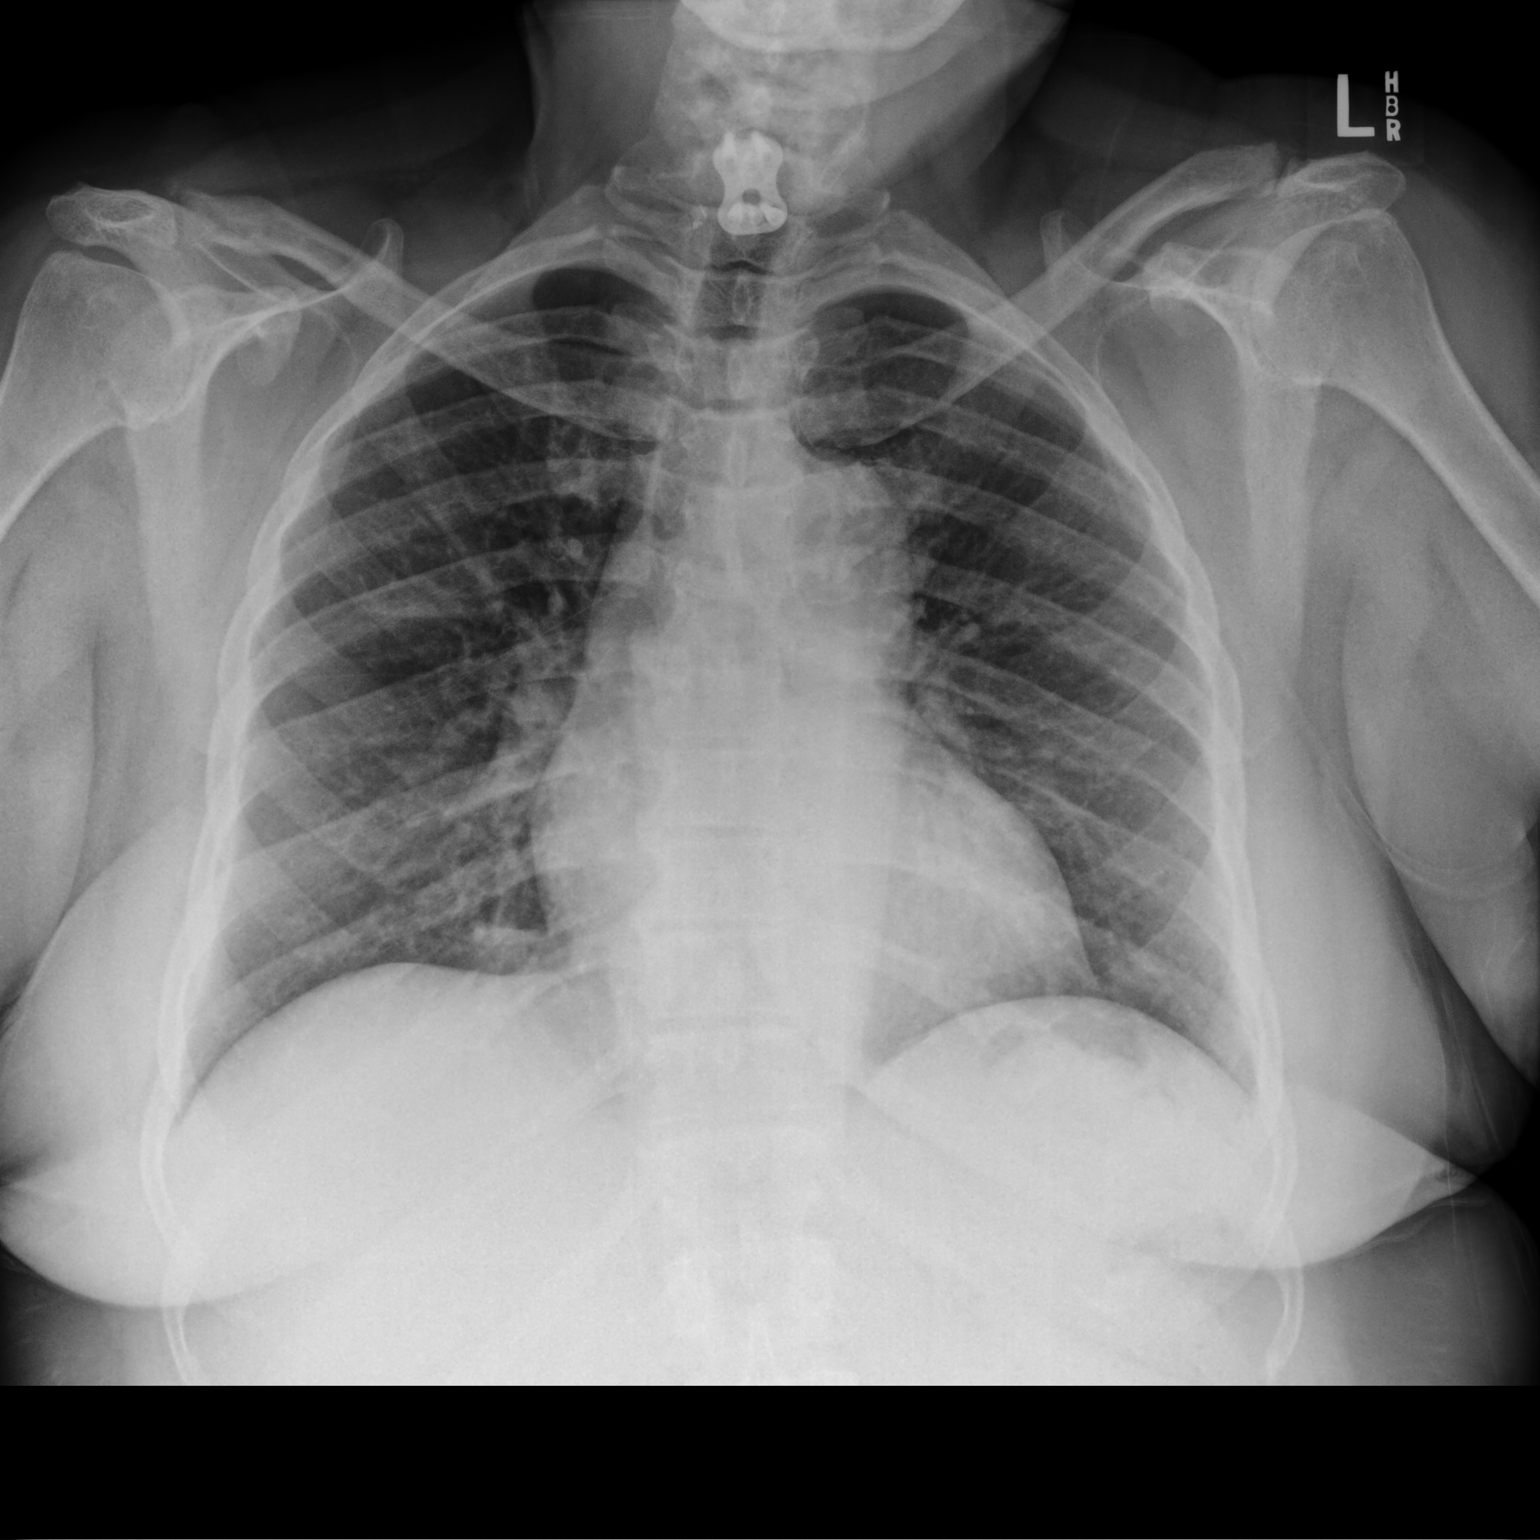

[chest lat]
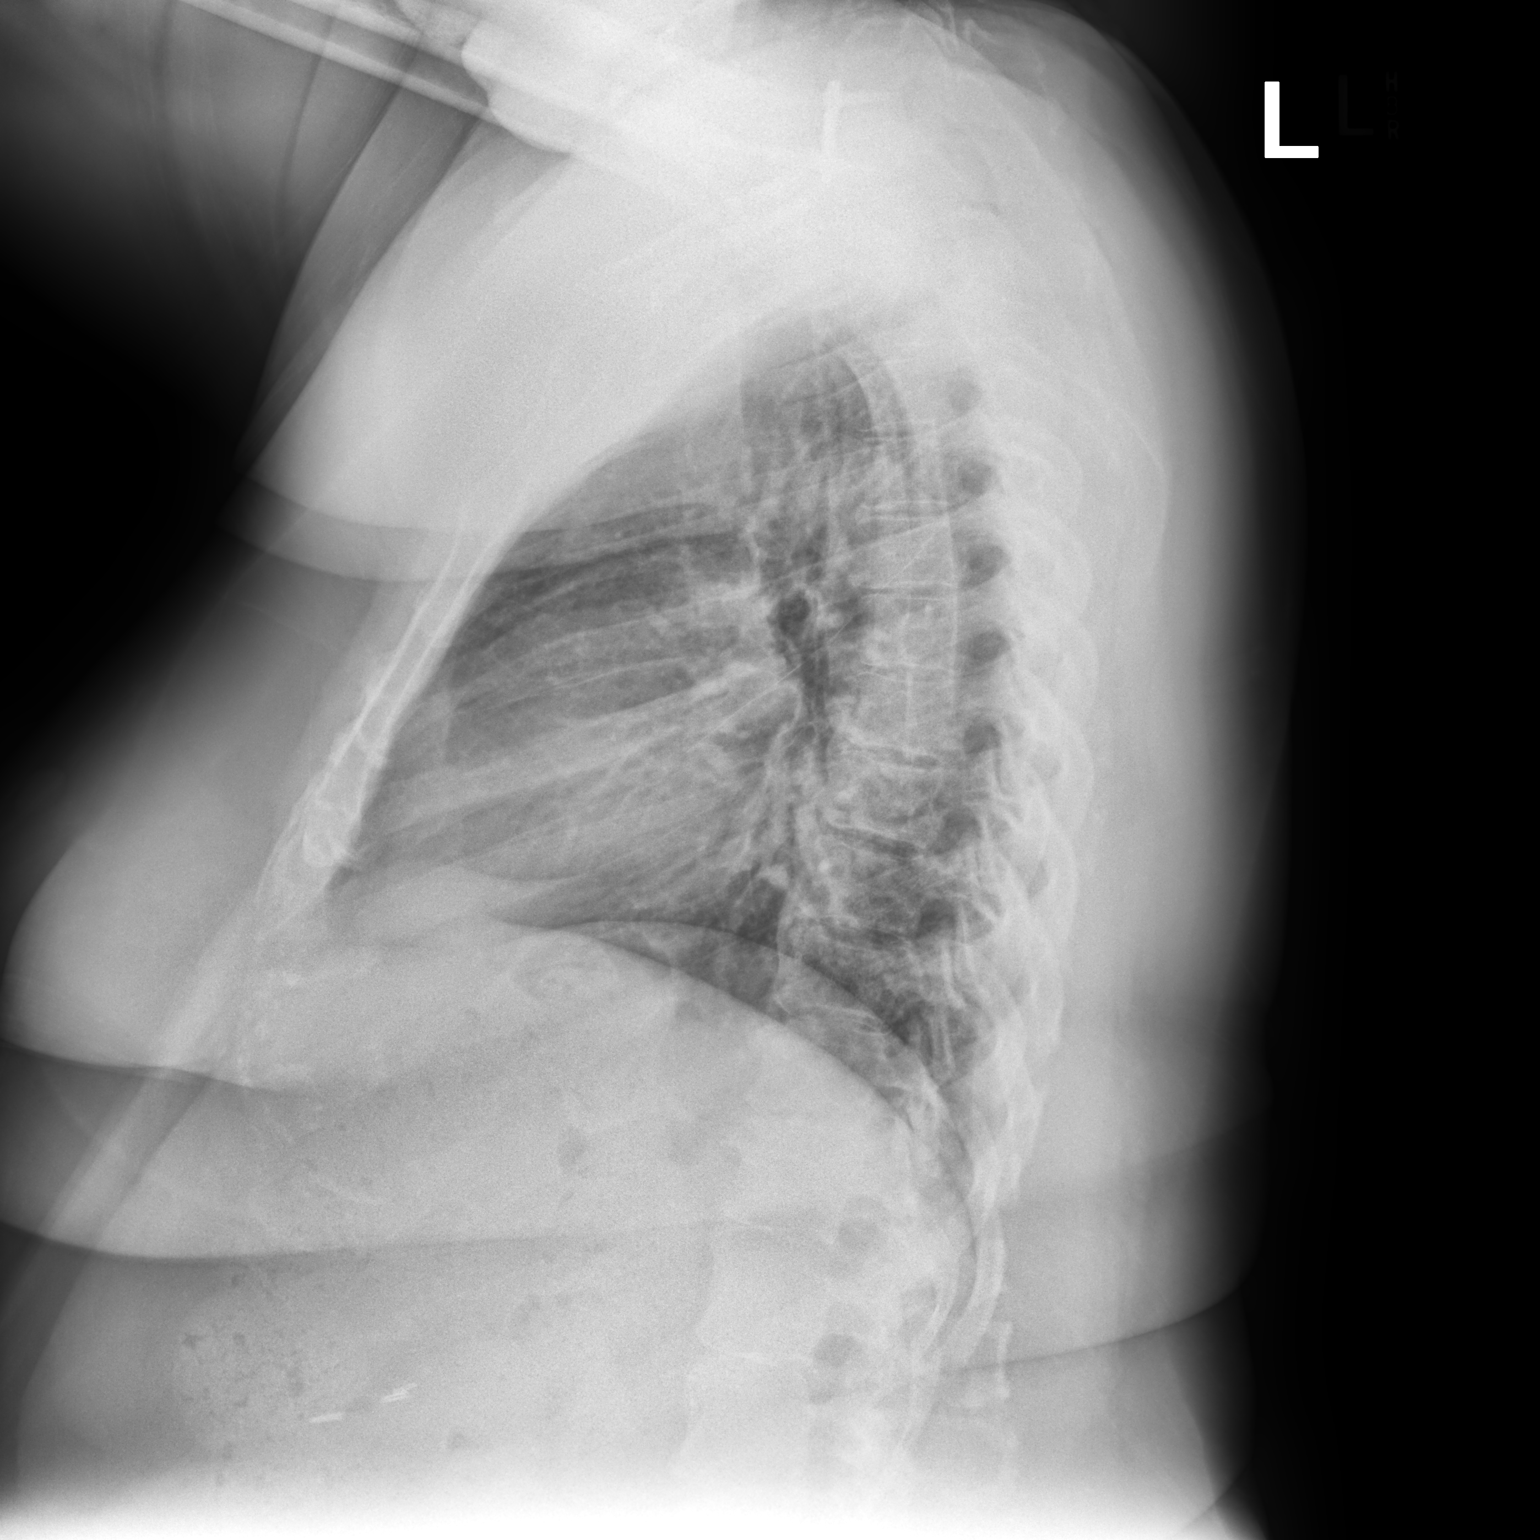

[2 of 2 positions shown; findings below may reference images not displayed]

FINDINGS: Trachea is midline. Heart size normal. Probable mild vascular
prominence in the right perihilar region. Lungs are otherwise clear.
No pleural fluid.
IMPRESSION: No acute findings.

## 2022-01-02 ENCOUNTER — Ambulatory Visit (INDEPENDENT_AMBULATORY_CARE_PROVIDER_SITE_OTHER): Payer: Medicare Other | Admitting: Family Medicine

## 2022-01-02 ENCOUNTER — Encounter: Payer: Self-pay | Admitting: Family Medicine

## 2022-01-02 VITALS — BP 144/98 | HR 93 | Temp 99.0°F | Wt 283.8 lb

## 2022-01-02 DIAGNOSIS — Z9009 Acquired absence of other part of head and neck: Secondary | ICD-10-CM

## 2022-01-02 DIAGNOSIS — E1140 Type 2 diabetes mellitus with diabetic neuropathy, unspecified: Secondary | ICD-10-CM | POA: Diagnosis not present

## 2022-01-02 DIAGNOSIS — I10 Essential (primary) hypertension: Secondary | ICD-10-CM | POA: Diagnosis not present

## 2022-01-02 DIAGNOSIS — I5032 Chronic diastolic (congestive) heart failure: Secondary | ICD-10-CM | POA: Diagnosis not present

## 2022-01-02 LAB — COMPREHENSIVE METABOLIC PANEL
ALT: 19 U/L (ref 0–35)
AST: 16 U/L (ref 0–37)
Albumin: 4.1 g/dL (ref 3.5–5.2)
Alkaline Phosphatase: 79 U/L (ref 39–117)
BUN: 12 mg/dL (ref 6–23)
CO2: 25 mEq/L (ref 19–32)
Calcium: 8.9 mg/dL (ref 8.4–10.5)
Chloride: 102 mEq/L (ref 96–112)
Creatinine, Ser: 0.73 mg/dL (ref 0.40–1.20)
GFR: 85.6 mL/min (ref >60.00)
Glucose, Bld: 205 mg/dL — ABNORMAL HIGH (ref 70–99)
Potassium: 3.6 mEq/L (ref 3.5–5.1)
Sodium: 139 mEq/L (ref 135–145)
Total Bilirubin: 0.2 mg/dL (ref 0.2–1.2)
Total Protein: 7.8 g/dL (ref 6.0–8.3)

## 2022-01-02 LAB — T4, FREE: Free T4: 0.67 ng/dL (ref 0.60–1.60)

## 2022-01-02 LAB — TSH: TSH: 5.08 u[IU]/mL (ref 0.35–5.50)

## 2022-01-02 LAB — MICROALBUMIN / CREATININE URINE RATIO
Creatinine,U: 93.7 mg/dL
Microalb Creat Ratio: 43 mg/g — ABNORMAL HIGH (ref 0.0–30.0)
Microalb, Ur: 40.3 mg/dL — ABNORMAL HIGH (ref 0.0–1.9)

## 2022-01-02 LAB — HEMOGLOBIN A1C: Hgb A1c MFr Bld: 9.5 % — ABNORMAL HIGH (ref 4.6–6.5)

## 2022-01-02 MED ORDER — SEMAGLUTIDE(0.25 OR 0.5MG/DOS) 2 MG/3ML ~~LOC~~ SOPN: PEN_INJECTOR | SUBCUTANEOUS | 3 refills | Status: DC

## 2022-01-02 NOTE — Patient Instructions (Addendum)
A prescription for Ozempic was sent to your pharmacy.  When you are starting this medication you are to inject 0.25 mg once a week (every 7 days) for 4 weeks.  At the end of the 4 weeks the dose will increase to 0.5 mg weekly.  Dependence consent and can be adjusted to account for the increase in dose.

## 2022-01-02 NOTE — Progress Notes (Signed)
Subjective:    Patient ID: [name], female    DOB: 02/13/1956, 66 y.o.   MRN: [id number]  Chief Complaint  Patient presents with   Follow-up    A1c, shot for DM and wt loss. Was informed by pharmacist.     HPI Patient was seen today for f/u.  Pt seen by pharmacist.  Interested in starting ozempic.  Feels like she can give herself injections.  Pt states bs up and down just like bp.  Bs 122-200s.  Was 222 Saturday am.  Pt had pinto beans and broiled chicken from [employer] on wheels that Friday night for dinner.  Pt endorses taking bp meds consistently.  Denies LE edema, CP, HAs, changes in vision.  Past Medical History:  Diagnosis Date   Adrenal mass ([place]) 03/226   Benign   Arthritis    knees/multiple orthopedic conditons; lower back   Asthma    per pt   Clotting disorder (HCC)    +beta-2-glycoprotein IgA antibody   COPD (chronic obstructive pulmonary disease) (HCC)    inhalers dependent on environment   Depression    Diabetes mellitus    120s usually fasting -  dx more than 10 yrs ago   Dizziness, nonspecific    DVT (deep venous thrombosis) (HCC)    Recurrent   Fibromyalgia    GERD (gastroesophageal reflux disease)    Heart murmur    History of blood transfusion    History of cocaine abuse ([place])    Remote history    Hyperlipidemia    Hypertension    takes meds daily   Hypothyroidism    Lupus Anticoagulant Positive    On home oxygen therapy    at night   Shortness of breath    exertion or lying flat   Sleep apnea    2l of oxygen at night (as of 12/6, she used to)    Allergies  Allergen Reactions   Lisinopril Swelling    THROAT SWELLING Pt states her throat started to "close up" after being on it for "so long."   Corticosteroids Rash    States she developed a rash on her tongue after a steroid injection in her bac    ROS General: Denies fever, chills, night sweats, changes in weight, changes in appetite HEENT: Denies headaches, ear pain, changes in  vision, rhinorrhea, sore throat CV: Denies CP, palpitations, SOB, orthopnea Pulm: Denies SOB, cough, wheezing GI: Denies abdominal pain, nausea, vomiting, diarrhea, constipation GU: Denies dysuria, hematuria, frequency, vaginal discharge Msk: Denies muscle cramps, joint pains Neuro: Denies weakness, numbness, tingling Skin: Denies rashes, bruising Psych: Denies depression, anxiety, hallucinations      Objective:    Blood pressure (!) 144/98, pulse 93, temperature 99 F (37.2 C), temperature source Oral, weight 283 lb 12.8 oz (128.7 kg), SpO2 96 %.Body mass index is 45.81 kg/m.  Gen. Pleasant, well-nourished, in no distress, normal affect   HEENT: /AT, face symmetric, conjunctiva clear, no scleral icterus, PERRLA, EOMI, nares patent without drainage, Lungs: no accessory muscle use, CTAB, no wheezes or rales Cardiovascular: RRR, no m/r/g, no peripheral edema Musculoskeletal: No deformities, no cyanosis or clubbing, normal tone Neuro:  A&Ox3, CN II-XII intact, normal gait Skin:  Warm, no lesions/ rash   Wt Readings from Last 3 Encounters:  01/02/22 283 lb 12.8 oz (128.7 kg)  12/07/21 283 lb 6.4 oz (128.5 kg)  10/13/21 283 lb 12.8 oz (128.7 kg)    Lab Results  Component Value Date   WBC 5.7  04/21/2021   HGB 12.7 04/21/2021   HCT 39.4 04/21/2021   PLT 316.0 04/21/2021   GLUCOSE 164 (H) 04/21/2021   CHOL 194 02/02/2020   TRIG 196 (H) 02/02/2020   HDL 76 02/02/2020   LDLCALC 89 02/02/2020   ALT 20 04/21/2021   AST 19 04/21/2021   NA 139 04/21/2021   K 3.1 (L) 04/21/2021   CL 101 04/21/2021   CREATININE 0.87 04/21/2021   BUN 17 04/21/2021   CO2 28 04/21/2021   TSH 5.15 04/21/2021   INR 3.6 (H) 10/27/2021   HGBA1C 8.4 (H) 04/21/2021   MICROALBUR 12.5 (H) 08/11/2020    Assessment/Plan:  Type 2 diabetes mellitus with diabetic neuropathy, without long-term current use of insulin ([place])  -hgbA1C 8.4% on 04/21/21 -repeat A1C this visit -foot exam done  11/30/21 -continue metformin.  Will hold invokana 100 mg while starting ozmepic to avoid hypoglycemia.  If needed restart based on bs. -continue statin.  Allergy to ACE-I - Plan: Microalbumin/Creatinine Ratio, Urine, Semaglutide,0.25 or 0.'5MG'$ /DOS, 2 MG/3ML SOPN, CMP, Hemoglobin A1c  Essential hypertension  -elevated -pt encouraged to continue current meds -lifestyle modifications encouraged -continue f/u with Cards - Plan: CMP  Morbid obesity ([place])  -Body mass index is 45.81 kg/m. -lifestyle modifications -will start ozempic 0.25 mg wkly with plans to increase dose as tolerated. - Plan: Semaglutide,0.25 or 0.'5MG'$ /DOS, 2 MG/3ML SOPN, CMP  Chronic diastolic CHF (congestive heart failure) (HCC) -euvolemic on exam -continue current meds -continue f/u with Cards  History of lobectomy of thyroid  -s/p R lobectomy 2018 - Plan: TSH, T4, Free  F/u 4-6 wks, sooner if needed.  [name], MD

## 2022-01-07 ENCOUNTER — Other Ambulatory Visit: Payer: Self-pay | Admitting: Family Medicine

## 2022-01-12 ENCOUNTER — Other Ambulatory Visit: Payer: Self-pay | Admitting: Family Medicine

## 2022-01-12 ENCOUNTER — Other Ambulatory Visit: Payer: Self-pay | Admitting: Internal Medicine

## 2022-01-12 DIAGNOSIS — E876 Hypokalemia: Secondary | ICD-10-CM

## 2022-01-12 DIAGNOSIS — M797 Fibromyalgia: Secondary | ICD-10-CM

## 2022-01-13 ENCOUNTER — Inpatient Hospital Stay: Payer: Medicare Other

## 2022-01-13 ENCOUNTER — Inpatient Hospital Stay: Payer: Medicare Other | Attending: Oncology | Admitting: Oncology

## 2022-01-13 ENCOUNTER — Encounter: Payer: Self-pay | Admitting: Oncology

## 2022-01-13 VITALS — BP 150/90 | HR 90 | Temp 98.2°F | Resp 18 | Ht 66.0 in | Wt 288.0 lb

## 2022-01-13 DIAGNOSIS — Z86718 Personal history of other venous thrombosis and embolism: Secondary | ICD-10-CM | POA: Insufficient documentation

## 2022-01-13 DIAGNOSIS — Z7901 Long term (current) use of anticoagulants: Secondary | ICD-10-CM | POA: Diagnosis not present

## 2022-01-13 DIAGNOSIS — I82403 Acute embolism and thrombosis of unspecified deep veins of lower extremity, bilateral: Secondary | ICD-10-CM

## 2022-01-13 LAB — PROTIME-INR
INR: 1.7 — ABNORMAL HIGH (ref 0.8–1.2)
Prothrombin Time: 19.7 seconds — ABNORMAL HIGH (ref 11.4–15.2)

## 2022-01-13 NOTE — Progress Notes (Signed)
  [facility] OFFICE PROGRESS NOTE   Diagnosis: Chronic anticoagulation  INTERVAL HISTORY:   [name] returns as scheduled.  She is taking Coumadin daily.  No bleeding or symptom of thrombosis.  She started semaglutide yesterday.  Objective:  Vital signs in last 24 hours:  Blood pressure (!) 150/90, pulse 90, temperature 98.2 F (36.8 C), temperature source Oral, resp. rate 18, height '5\' 6"'$  (1.676 m), weight 288 lb (130.6 kg), SpO2 96 %.   Resp: Lungs clear bilaterally Cardio: Regular rate and rhythm GI: No hepatosplenomegaly, nontender Vascular: No leg edema or erythema.  The left lower leg is larger than the right side   Lab Results:  Lab Results  Component Value Date   WBC 5.7 04/21/2021   HGB 12.7 04/21/2021   HCT 39.4 04/21/2021   MCV 83.0 04/21/2021   PLT 316.0 04/21/2021   NEUTROABS 3.2 04/21/2021    CMP  Lab Results  Component Value Date   NA 139 01/02/2022   K 3.6 01/02/2022   CL 102 01/02/2022   CO2 25 01/02/2022   GLUCOSE 205 (H) 01/02/2022   BUN 12 01/02/2022   CREATININE 0.73 01/02/2022   CALCIUM 8.9 01/02/2022   PROT 7.8 01/02/2022   ALBUMIN 4.1 01/02/2022   AST 16 01/02/2022   ALT 19 01/02/2022   ALKPHOS 79 01/02/2022   BILITOT 0.2 01/02/2022   GFRNONAA >60 02/18/2021   GFRAA 94 03/05/2020    Medications: I have reviewed the patient's current medications.   Assessment/Plan: History of bilateral lower extremity deep vein thrombosis. She continues Coumadin anticoagulation. History of a positive lupus anticoagulant. History of a positive beta-2-glycoprotein IgA antibody. Chronic bilateral leg pain - negative bilateral Doppler January 2010. History of hypertension. COPD Status post hysterectomy. Remote history of cocaine abuse. History of an adrenal lesion on a CT scan in 2008 - status post a repeat CT on June 16, 2009, showing slow enlargement of the right adrenal nodule since 2006, most consistent with an adenoma. Stable  on a CT 04/24/2014 History of rectal and vaginal bleeding in May 2009.   Status post carpal tunnel surgery. 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 Right knee replacement September 2011. Multiple orthopedic conditions. Placement of IVC filter preoperatively before knee replacement. The IVC filter was retrieved 03/01/2011. Anemia. Ferritin in normal range at 82 on 08/18/2013; 61 on 07/10/2016 Status post lumbar fusion surgery October 2013 Right hydronephrosis-evaluated by Dr. Tresa Moore Left ankle swelling/pain 07/24/2016 Mild anemia noted on hospital admission 2018 Colonoscopy 04/19/2017-entire examined colon normal.  Repeat colonoscopy in 10 years for surveillance. Admission with COVID-19 infection 03/09/2019    Disposition: [name] appears stable.  The PT/INR is slightly below the therapeutic range today, but has been high recently.  She will continue Coumadin at current dose.  She will call for symptoms of thrombosis.  She will return for a PT/INR in 1 month.  She will be scheduled for a 34-monthoffice visit.  GBetsy Coder MD  01/13/2022  9:31 AM

## 2022-01-16 ENCOUNTER — Telehealth: Payer: Self-pay | Admitting: Pharmacist

## 2022-01-16 NOTE — Chronic Care Management (AMB) (Signed)
Chronic Care Management Pharmacy Assistant   Name: [name]  MRN: [id number] DOB: [birth year]  Reason for Encounter: Medication Review / Medication Coordination Call   Recent office visits:  01/02/2022 [name] MD - Patient was seen for Type 2 diabetes mellitus with diabetic neuropathy, without long-term current use of insulin and additional concerns. Started Semaglutide Inject 0.25 mg subcu weekly x4 weeks. On week 5 increase dose to 0.5 mg weekly. Follow up in 5 weeks.  Recent consult visits:  01/13/2022 [name] MD (oncology) - Patient was seen for chronic anticoagulation. No medication changes. Follow up in 6 months.  Hospital visits:  None  Medications: Outpatient Encounter Medications as of 01/16/2022  Medication Sig   ACCU-CHEK GUIDE test strip USE TO check blood glucose UP TO four times daily AS DIRECTED   Accu-Chek Softclix Lancets lancets USE TO check blood glucose UP TO four times daily AS DIRECTED Needs appointment for further refills   albuterol (PROVENTIL) (2.5 MG/3ML) 0.083% nebulizer solution USE 1 VIAL IN NEBULIZER EVERY 6 HOURS - and as needed   albuterol (VENTOLIN HFA) 108 (90 Base) MCG/ACT inhaler Inhale 2 puffs into the lungs every 4 (four) hours as needed for shortness of breath.   blood glucose meter kit and supplies KIT Dispense based on patient and insurance preference. Use up to four times daily as directed. (FOR ICD-9 250.00, 250.01).   budesonide-formoterol (SYMBICORT) 160-4.5 MCG/ACT inhaler Inhale 2 puffs into the lungs 2 (two) times daily.   cloNIDine (CATAPRES) 0.1 MG tablet TAKE ONE TABLET BY MOUTH BEFORE BREAKFAST and TAKE ONE TABLET BY MOUTH EVERYDAY AT BEDTIME   diclofenac sodium (VOLTAREN) 1 % GEL Apply 2 g topically daily as needed (fibromyalgia pain).   diltiazem (CARDIZEM CD) 240 MG 24 hr capsule TAKE ONE CAPSULE BY MOUTH BEFORE BREAKFAST   DULoxetine (CYMBALTA) 30 MG capsule Take 90 mg by mouth at bedtime.   FEROSUL 325 (65  Fe) MG tablet TAKE ONE TABLET BY MOUTH ONCE DAILY   fexofenadine (ALLEGRA) 180 MG tablet TAKE ONE TABLET BY MOUTH ONCE DAILY   fluticasone (FLONASE) 50 MCG/ACT nasal spray Place 2 sprays into both nostrils 2 (two) times daily.   furosemide (LASIX) 80 MG tablet TAKE ONE TABLET BY MOUTH TWICE DAILY   gabapentin (NEURONTIN) 300 MG capsule TAKE ONE CAPSULE BY MOUTH TWICE DAILY   INVOKANA 100 MG TABS tablet TAKE ONE TABLET BY MOUTH BEFORE BREAKFAST   LINZESS 145 MCG CAPS capsule TAKE ONE CAPSULE BY MOUTH every other DAY   metFORMIN (GLUCOPHAGE) 500 MG tablet TAKE ONE TABLET BY MOUTH EVERYDAY AT BEDTIME   omeprazole (PRILOSEC) 20 MG capsule TAKE ONE CAPSULE BY MOUTH BEFORE BREAKFAST and TAKE ONE CAPSULE BY MOUTH EVERY EVENING   potassium chloride (MICRO-K) 10 MEQ CR capsule TAKE TWO CAPSULES BY MOUTH EVERYDAY AT BEDTIME   pravastatin (PRAVACHOL) 40 MG tablet TAKE ONE TABLET BY MOUTH EVERY EVENING   QUEtiapine (SEROQUEL) 50 MG tablet Take 50 mg by mouth 2 (two) times daily.   Semaglutide,0.25 or 0.5MG/DOS, 2 MG/3ML SOPN Inject 0.25 mg subcu weekly x4 weeks.  On week 5 increase dose to 0.5 mg weekly.   spironolactone (ALDACTONE) 25 MG tablet TAKE 1/2 TABLET BY MOUTH ONCE DAILY   warfarin (COUMADIN) 10 MG tablet TAKE ONE TABLET BY MOUTH ONCE daily. EXCEPT MONDAY, WEDNESDAY, FRIDAY or as directed   warfarin (COUMADIN) 7.5 MG tablet TAKE ONE TABLET BY MOUTH ON Monday, Wednesday, AND Fridays   zonisamide (ZONEGRAN) 100 MG capsule  Take by mouth.   zonisamide (ZONEGRAN) 50 MG capsule 1 TABLET AM AND 2 TABLETS PM   Facility-Administered Encounter Medications as of 01/16/2022  Medication   sodium chloride flush (NS) 0.9 % injection 3 mL   Reviewed chart for medication changes ahead of medication coordination call.  BP Readings from Last 3 Encounters:  01/13/22 (!) 150/90  01/02/22 (!) 144/98  12/07/21 (!) 152/100    Lab Results  Component Value Date   HGBA1C 9.5 (H) 01/02/2022     Patient obtains  medications through Adherence Packaging  30 Days    Last adherence delivery included: Zonisamide 100 mg  - take 1 capsule at breakfast and 2 capsules at dinner Gabapentin 300 mg - take 1 capsule with breakfast and dinner Symbicort 160/4.5 - use 2 puff twice daily Spironolactone 25 mg - take 1/2 tablet at breakfast Furosemide 80 mg - take 1 tablet at breakfast and dinner Pravastatin 40 mg  - take 1 tablet at dinner Cartia XT 240 mg - take 1 capsule before breakfast Invokana 100 mg - take 1 tablet before breakfast Potassium CL ER 53mq -  take 2 capsules at bedtime Warfarin 10 mg - take 1 tablet daily or as directed Metformin 500 mg - take 1 tablet at bedtime Fexofenadine 180 mg once daily at breakfast  Duloxetine 30 mg  - take 3 capsules at bedtime  Quetiapine 50 mg - take 1 tablet at breakfast and bedtime  Clonidine 0.1 mg - take 1 tablet before breakfast and bedtime Accu-chek test strips Accu-chek softclix lancets Albuterol HFA 2 puffs every 4 hours as needed Linzess 145 mcg - Take one capsule every other day Omeprazole - 20 mg - take 1 capsule before breakfast and dinner Warfarin 7.5 mg - take 1 tablet Mon-Wed-Fri or as directed   Patient declined (meds) last month:  No denied medications     Patient is due for next adherence delivery on: 01/26/2022   Called patient and reviewed medications and coordinated delivery.    This delivery to include: Zonisamide 100 mg  - take 1 capsule at breakfast and 2 capsules at dinner Gabapentin 300 mg - take 1 capsule with breakfast and dinner Symbicort 160/4.5 - use 2 puff twice daily Spironolactone 25 mg - take 1/2 tablet at breakfast Furosemide 80 mg - take 1 tablet at breakfast and dinner Pravastatin 40 mg  - take 1 tablet at dinner Cartia XT 240 mg - take 1 capsule before breakfast Invokana 100 mg - take 1 tablet before breakfast Potassium CL ER 18m -  take 2 capsules at bedtime Warfarin 10 mg - take 1 tablet daily or as  directed Metformin 500 mg - take 1 tablet at bedtime Fexofenadine 180 mg once daily at breakfast  Duloxetine 30 mg  - take 3 capsules at bedtime  Quetiapine 50 mg - take 1 tablet at breakfast and bedtime  Clonidine 0.1 mg - take 1 tablet before breakfast and bedtime Accu-chek test strips Accu-chek softclix lancets Albuterol HFA 2 puffs every 4 hours as needed Linzess 145 mcg - Take one capsule every other day Omeprazole - 20 mg - take 1 capsule before breakfast and dinner Warfarin 7.5 mg - take 1 tablet Mon-Wed-Fri or as directed     Patient will need a short fill: No short fill needed   Coordinated acute fill: No acute fill needed.   Patient declined the following medications:  Semaglutide filled 01/03/22 for 42 DS   Note: Patient states she has gotten  a new arm blood pressure cuff, she states she has to schedule a visit to learn to give herself semaglutide, she plans to take her blood pressure cuff that day for comparison and to be sure she is using it correctly.    Confirmed delivery date of 01/26/2022, advised patient that pharmacy will contact them the morning of delivery.  Care Gaps: AWV - scheduled 02/24/2022 Last BP - 150/90 on 01/13/2022 Last A1C - 9.5 on 01/02/2022 Covid - overdue Pneumonia - postponed Hep C Screen - postponed Mammogram - postponed Tdap - postponed Shigrix - postponed Flu - postponed  Star Rating Drugs: Invokana 100 mg - last filled 12/28/2021 30 DS at [facility] Metformin 500 mg - last filled 12/28/2021 30 DS at [facility] Pravastatin 40 mg - last filled 12/28/2021 30 DS at [facility] Semaglutide 55m/3ml - last [facility]2022 [phone]

## 2022-01-16 NOTE — Chronic Care Management (AMB) (Signed)
error 

## 2022-01-17 ENCOUNTER — Ambulatory Visit: Payer: Medicare Other | Admitting: Pharmacist

## 2022-01-17 VITALS — BP 152/90

## 2022-01-17 DIAGNOSIS — I10 Essential (primary) hypertension: Secondary | ICD-10-CM

## 2022-01-17 DIAGNOSIS — E1140 Type 2 diabetes mellitus with diabetic neuropathy, unspecified: Secondary | ICD-10-CM

## 2022-01-17 NOTE — Addendum Note (Signed)
Addended by: Viona Gilmore on: 01/17/2022 08:07 AM   Modules accepted: Orders, Level of Service

## 2022-01-17 NOTE — Progress Notes (Signed)
Chronic Care Management Pharmacy Note  01/17/2022 Name:  [name] MRN:  [id number] DOB:  1955-12-22  Summary: A1c not at [place] < 7% BP not at [place] < 130/80 Pt brought BP cuff and Ozempic for instructions  Recommendations/Changes made from today's visit: -Recommended starting Ozempic for A1c lowering and weight loss -Demonstrated proper use and administration of Ozempic -Demonstrated proper use of blood pressure cuff -Recommended 3-4 times a week BP monitoring at home  Plan: Tolerance and BP assessment in 2-3 weeks Follow up in 3 months  Subjective: [name] is an 66 y.o. year old female who is a primary patient of Volanda Napoleon, Langley Adie, MD.  The CCM team was consulted for assistance with disease management and care coordination needs.    Engaged with patient by telephone for follow up visit in response to provider referral for pharmacy case management and/or care coordination services.   Consent to Services:  The patient was given information about Chronic Care Management services, agreed to services, and gave verbal consent prior to initiation of services.  Please see initial visit note for detailed documentation.   Patient Care Team: Billie Ruddy, MD as PCP - General (Family Medicine) Werner Lean, MD as PCP - Cardiology (Cardiology) Viona Gilmore, [facility] as Pharmacist (Pharmacist)  Recent office visits: 01/02/2022 [name] MD - Patient was seen for Type 2 diabetes mellitus with diabetic neuropathy, without long-term current use of insulin and additional concerns. Started Semaglutide Inject 0.25 mg subcu weekly x4 weeks. On week 5 increase dose to 0.5 mg weekly. Follow up in 5 weeks.  12/07/2021 [name] MD - Patient was seen for Abnormal sensation in right ear and additional concerns. Discontinued Magic mouthwash, Meclizine and Reglan. Follow up in 2 months.   08/11/2021 [name] MD - Patient was seen for sore throat and  additional issues. Started [employer] and Fexofenadine 180 mg daily. Return if symptoms worsen or fail to improve.   Recent consult visits: 01/13/2022 [name] MD (oncology) - Patient was seen for chronic anticoagulation. No medication changes. Follow up in 6 months.  12/06/2021 Acquanetta Sit DPM - Patient was seen for Pain due to onychomycosis of toenails of both feet and additional concerns. No medication changes. Follow up in 3 months.   10/13/2021 Ned Card NP (hem-onc) - Patient was seen for  Chronic anticoagulation and an additional issue. No medication changes. Follow up in 3 months.   10/07/2021 [name] MD (cardiology) - Patient was seen for Diabetes mellitus with coincident hypertension and additional issues. Discontinued Vit D. Follow up in 1 year.  07/14/2021 [name] MD (oncology) - Patient was seen for chronic anticoagulation. No medication changes. No follow up noted.   06/29/2021 [name] MD (Neurology - [facility]) - Patient was seen for Stroke like episode. No medication changes. No follow up noted.  Hospital visits: None in previous 6 months  Objective:  Lab Results  Component Value Date   CREATININE 0.73 01/02/2022   BUN 12 01/02/2022   GFR 85.60 01/02/2022   GFRNONAA >60 02/18/2021   GFRAA 94 03/05/2020   NA 139 01/02/2022   K 3.6 01/02/2022   CALCIUM 8.9 01/02/2022   CO2 25 01/02/2022   GLUCOSE 205 (H) 01/02/2022    Lab Results  Component Value Date/Time   HGBA1C 9.5 (H) 01/02/2022 02:50 PM   HGBA1C 8.4 (H) 04/21/2021 10:42 AM   GFR 85.60 01/02/2022 02:50 PM   GFR 69.69 04/21/2021 10:42 AM  MICROALBUR 40.3 (H) 01/02/2022 02:50 PM   MICROALBUR 12.5 (H) 08/11/2020 11:16 AM    Last diabetic Eye exam:  Lab Results  Component Value Date/Time   HMDIABEYEEXA No Retinopathy 11/30/2021 12:00 AM    Last diabetic Foot exam: No results found for: "HMDIABFOOTEX"   Lab Results  Component Value Date   CHOL 194  02/02/2020   HDL 76 02/02/2020   LDLCALC 89 02/02/2020   TRIG 196 (H) 02/02/2020   CHOLHDL 2.6 02/02/2020       Latest Ref Rng & Units 01/02/2022    2:50 PM 04/21/2021   10:42 AM 02/02/2021    2:29 PM  Hepatic Function  Total Protein 6.0 - 8.3 g/dL 7.8  8.5  8.1   Albumin 3.5 - 5.2 g/dL 4.1  4.3  4.3   AST 0 - 37 U/L _0 ALT 0 - 35 U/L _1 Alk Phosphatase 39 - 117 U/L 79  80  84   Total Bilirubin 0.2 - 1.2 mg/dL 0.2  0.3  0.4     Lab Results  Component Value Date/Time   TSH 5.08 01/02/2022 02:50 PM   TSH 5.15 04/21/2021 10:42 AM   FREET4 0.67 01/02/2022 02:50 PM   FREET4 0.62 04/21/2021 10:42 AM       Latest Ref Rng & Units 04/21/2021   10:42 AM 02/28/2021    8:00 AM 02/21/2021    3:32 PM  CBC  WBC 4.0 - 10.5 K/uL 5.7   7.2   Hemoglobin 12.0 - 15.0 g/dL 12.7  12.6  12.0   Hematocrit 36.0 - 46.0 % 39.4  37.0  36.6   Platelets 150.0 - 400.0 K/uL 316.0   323     Lab Results  Component Value Date/Time   VD25OH 31.30 04/21/2021 10:42 AM   VD25OH 18.14 (L) 02/02/2021 02:29 PM    Clinical ASCVD: No  The 10-year ASCVD risk score (Arnett DK, et al., 2019) is: 28.5%   Values used to calculate the score:     Age: 64 years     Sex: Female     Is Non-Hispanic African American: Yes     Diabetic: Yes     Tobacco smoker: No     Systolic Blood Pressure: 161 mmHg     Is BP treated: Yes     HDL Cholesterol: 76 mg/dL     Total Cholesterol: 194 mg/dL       01/02/2022    2:16 PM 12/07/2021    2:35 PM 08/11/2021   11:45 AM  Depression screen PHQ 2/9  Decreased Interest _2 Down, Depressed, Hopeless 1 0 1  PHQ - 2 Score _3 Altered sleeping 0 0 0  Tired, decreased energy _4 Change in appetite _5 Feeling bad or failure about yourself  0 0   Trouble concentrating 1 0 1  Moving slowly or fidgety/restless 0 0 0  Suicidal thoughts 0 0 0  PHQ-9 Score _6 Difficult doing work/chores Somewhat difficult Somewhat difficult Somewhat difficult     mMRC Dyspnea Scale mMRC Score  03/29/2021 10:45 AM 0  08/12/2019  9:21 AM 1      08/12/2019    9:19 AM  CAT Score  Total CAT Score 21     Social History   Tobacco Use  Smoking Status Never  Smokeless Tobacco Never   BP Readings from Last  3 Encounters:  01/17/22 (!) 152/90  01/13/22 (!) 150/90  01/02/22 (!) 144/98   Pulse Readings from Last 3 Encounters:  01/13/22 90  01/02/22 93  12/07/21 80   Wt Readings from Last 3 Encounters:  01/13/22 288 lb (130.6 kg)  01/02/22 283 lb 12.8 oz (128.7 kg)  12/07/21 283 lb 6.4 oz (128.5 kg)   BMI Readings from Last 3 Encounters:  01/13/22 46.48 kg/m  01/02/22 45.81 kg/m  12/07/21 45.74 kg/m    Assessment/Interventions: Review of patient past medical history, allergies, medications, health status, including review of consultants reports, laboratory and other test data, was performed as part of comprehensive evaluation and provision of chronic care management services.   SDOH:  (Social Determinants of Health) assessments and interventions performed: Yes (last 12/21/21) SDOH Interventions    Flowsheet Row Chronic Care Management from 12/21/2021 in [place] at [place] from 03/02/2021 in [place] [facility] [place] Management from 10/07/2020 in [place] at [place] from 03/05/2020 in [place] at [place] Interventions -- -- -- Intervention Not Indicated  Housing Interventions -- -- -- Intervention Not Indicated  Transportation Interventions -- -- Intervention Not Indicated Intervention Not Indicated  Depression Interventions/Treatment  -- Currently on Treatment -- PHQ2-9 Score <4 Follow-up Not Indicated  Financial Strain Interventions Intervention Not Indicated -- Intervention Not Indicated Intervention Not Indicated  Physical Activity Interventions -- -- -- Intervention Not Indicated  Stress Interventions  -- -- -- Intervention Not Indicated  Social Connections Interventions -- -- -- Intervention Not Indicated       SDOH Screenings   Food Insecurity: No Food Insecurity (03/02/2021)  Housing: Low Risk  (03/02/2021)  Transportation Needs: No Transportation Needs (03/02/2021)  Alcohol Screen: Low Risk  (03/05/2020)  Depression (PHQ2-9): Medium Risk (01/02/2022)  Financial Resource Strain: Low Risk  (12/21/2021)  Physical Activity: Insufficiently Active (03/02/2021)  Social Connections: Moderately Isolated (03/02/2021)  Stress: No Stress Concern Present (03/02/2021)  Tobacco Use: Low Risk  (01/13/2022)    CCM Care Plan  Allergies  Allergen Reactions   Lisinopril Swelling    THROAT SWELLING Pt states her throat started to "close up" after being on it for "so long."   Corticosteroids Rash    States she developed a rash on her tongue after a steroid injection in her bac    Medications Reviewed Today     Reviewed by Ladell Pier, MD (Physician) on 01/13/22 at [place] List Status: <None>   Medication Order Taking? Sig Documenting Provider Last Dose Status Informant  ACCU-CHEK GUIDE test strip [id number] Yes USE TO check blood glucose UP TO four times daily AS DIRECTED Billie Ruddy, MD Taking Active   Accu-Chek Softclix Lancets lancets [id number] Yes USE TO check blood glucose UP TO four times daily AS DIRECTED Needs appointment for further refills Billie Ruddy, MD Taking Active   albuterol (PROVENTIL) (2.5 MG/3ML) 0.083% nebulizer solution [id number] Yes USE 1 VIAL IN NEBULIZER EVERY 6 HOURS - and as needed Billie Ruddy, MD Taking Active   albuterol (VENTOLIN HFA) 108 (90 Base) MCG/ACT inhaler [id number] Yes Inhale 2 puffs into the lungs every 4 (four) hours as needed for shortness of breath. Billie Ruddy, MD Taking Active   blood glucose meter kit and supplies KIT [id number] Yes Dispense based on patient and insurance preference. Use up to four times daily as directed.  (FOR ICD-9 250.00, 250.01). Billie Ruddy, MD  Taking Active   budesonide-formoterol (SYMBICORT) 160-4.5 MCG/ACT inhaler [id number] Yes Inhale 2 puffs into the lungs 2 (two) times daily. Billie Ruddy, MD Taking Active   cloNIDine (CATAPRES) 0.1 MG tablet [id number] Yes TAKE ONE TABLET BY MOUTH BEFORE BREAKFAST and TAKE ONE TABLET BY MOUTH EVERYDAY AT BEDTIME Billie Ruddy, MD Taking Active   diclofenac sodium (VOLTAREN) 1 % GEL [id number] Yes Apply 2 g topically daily as needed (fibromyalgia pain). [provider] Taking Active Child  diltiazem (CARDIZEM CD) 240 MG 24 hr capsule [id number] Yes TAKE ONE CAPSULE BY MOUTH BEFORE BREAKFAST Billie Ruddy, MD Taking Active   DULoxetine (CYMBALTA) 30 MG capsule [id number] Yes Take 90 mg by mouth at bedtime. [provider] Taking Active   FEROSUL 325 (65 Fe) MG tablet [id number] Yes TAKE ONE TABLET BY MOUTH ONCE DAILY Billie Ruddy, MD Taking Active   fexofenadine (ALLEGRA) 180 MG tablet [id number] Yes TAKE ONE TABLET BY MOUTH ONCE DAILY Billie Ruddy, MD Taking Active   fluticasone (FLONASE) 50 MCG/ACT nasal spray [id number] Yes Place 2 sprays into both nostrils 2 (two) times daily. [provider] Taking Active   furosemide (LASIX) 80 MG tablet [id number] Yes TAKE ONE TABLET BY MOUTH TWICE DAILY Chandrasekhar, [name] A, MD Taking Active   gabapentin (NEURONTIN) 300 MG capsule [id number] Yes TAKE 1 CAPSULE(300 MG) BY MOUTH TWICE DAILY Billie Ruddy, MD Taking Active   INVOKANA 100 MG TABS tablet [id number] Yes TAKE ONE TABLET BY MOUTH BEFORE BREAKFAST Werner Lean, MD Taking Active   LINZESS 145 MCG CAPS capsule [id number] Yes TAKE ONE CAPSULE BY MOUTH every other DAY Billie Ruddy, MD Taking Active   metFORMIN (GLUCOPHAGE) 500 MG tablet [id number] Yes TAKE ONE TABLET BY MOUTH EVERYDAY AT BEDTIME Billie Ruddy, MD Taking Active   omeprazole (PRILOSEC) 20 MG capsule [id number] Yes TAKE ONE CAPSULE BY MOUTH  BEFORE BREAKFAST and TAKE ONE CAPSULE BY MOUTH EVERY EVENING Billie Ruddy, MD Taking Active   potassium chloride (MICRO-K) 10 MEQ CR capsule [id number] Yes TAKE TWO CAPSULES BY MOUTH EVERYDAY AT BEDTIME Billie Ruddy, MD Taking Active   pravastatin (PRAVACHOL) 40 MG tablet [id number] Yes TAKE ONE TABLET BY MOUTH EVERY EVENING Billie Ruddy, MD Taking Active   QUEtiapine (SEROQUEL) 50 MG tablet [id number] Yes Take 50 mg by mouth 2 (two) times daily. [provider] Taking Active   Semaglutide,0.25 or 0.5MG/DOS, 2 MG/3ML SOPN [id number] Yes Inject 0.25 mg subcu weekly x4 weeks.  On week 5 increase dose to 0.5 mg weekly. Billie Ruddy, MD Taking Active   sodium chloride flush (NS) 0.9 % injection 3 mL [id number]   [name] A, MD  Active   spironolactone (ALDACTONE) 25 MG tablet [id number] Yes TAKE 1/2 TABLET BY MOUTH ONCE DAILY Chandrasekhar, [name] A, MD Taking Active   warfarin (COUMADIN) 10 MG tablet [id number] Yes TAKE ONE TABLET BY MOUTH ONCE daily. EXCEPT MONDAY, WEDNESDAY, FRIDAY or as directed Ladell Pier, MD Taking Active   warfarin (COUMADIN) 7.5 MG tablet [id number] Yes TAKE ONE TABLET BY MOUTH ON Monday, Wednesday, AND Fridays Ladell Pier, MD Taking Active   zonisamide [facility]) 100 MG capsule [id number] Yes Take by mouth. [provider] Taking Active   zonisamide (ZONEGRAN) 50 MG capsule [id number] Yes 1 TABLET AM AND 2 TABLETS PM [provider] Taking Active             Patient Active Problem List   Diagnosis Date Noted  Mixed hyperlipidemia 10/07/2021   Paroxysmal SVT (supraventricular tachycardia) 10/07/2021   Allergic rhinitis 11/29/2020   Allergic rhinitis due to pollen 11/29/2020   Moderate persistent asthma without complication 11/94/1740   Osteoarthritis of spine with radiculopathy, lumbar region 08/24/2020   Chronic back pain 08/18/2020   Chronic heart failure with preserved ejection fraction ([place]) 02/27/2020    Obesity, diabetes, and hypertension syndrome ([place]) 02/27/2020   Vitreomacular adhesion of both eyes 02/09/2020   Pseudophakia of both eyes 02/09/2020   Chronic diastolic CHF (congestive heart failure) ([place]) 03/12/2019   Hypernatremia 03/09/2019   Suspected COVID-19 virus infection 03/09/2019   Acute encephalopathy 03/09/2019   Right sided weakness 02/20/2019   Thyroid mass 02/27/2017   Chronic pain of right knee 05/24/2016   Effusion, right knee 04/28/2016   Presence of right artificial knee joint 04/28/2016   Abnormal LFTs    Decreased sensation    Paresthesia 06/12/2014   Obstruction of kidney 04/24/2014   Diabetes type 2, uncontrolled 04/24/2014   Chronic anticoagulation 04/24/2014   Crack cocaine use 04/24/2014   Depression 04/24/2014   Fibromyalgia 04/24/2014   OSA (obstructive sleep apnea) 04/24/2014   Lupus anticoagulant positive 04/24/2014   Adrenal mass, right ([place]) 04/24/2014   Right kidney mass 04/24/2014   Hydronephrosis of right kidney 04/24/2014   Supratherapeutic INR 04/24/2014   Renal mass, right 04/24/2014   Diabetes mellitus with coincident hypertension ([place])    Right lower quadrant abdominal pain    Morbid obesity ([place]) 08/21/2013   Constipation 08/19/2013   Lumbosacral spondylosis without myelopathy 11/19/2012   Bilateral deep vein thromboses ([place]) 12/13/2011   Abdominal pain 11/05/2011   Hypokalemia 11/05/2011   Sciatica 11/05/2011   Diabetes mellitus type 2 in obese ([place]) 11/05/2011   HTN (hypertension) 11/05/2011    Immunization History  Administered Date(s) Administered   Influenza Split 01/08/2012   Influenza,inj,Quad PF,6+ Mos 12/31/2018   Influenza-Unspecified 12/18/2020   PFIZER Comirnaty(Gray Top)Covid-19 Tri-Sucrose Vaccine 11/12/2020   PFIZER(Purple Top)SARS-COV-2 Vaccination 06/30/2019, 07/21/2019   Pfizer Covid-19 Vaccine Bivalent Booster 12yr & up 02/14/2021   Pneumococcal Polysaccharide-23 01/08/2012   Patient brought her new BP  cuff to the office and Ozempic pen for demonstration. Patient properly demonstrated use of BP cuff. Patient's BP cuff was 157/94 on her right arm and the office manual cuff was 152/90 mmHg, which is about 5 mmHg off on SBP. Patient also demonstrated proper administration of Ozempic and is aware to call the office with any further questions.  Conditions to be addressed/monitored:  Hypertension, Hyperlipidemia, Diabetes, Heart Failure, COPD, and Depression  Conditions addressed this visit: Hypertension, hyperlipidemia, diabetes  Care Plan : [place] Updates made by PViona Gilmore RIrenesince 01/17/2022 12:00 AM     Problem: Problem: Hypertension, Hyperlipidemia, Diabetes, Heart Failure, COPD, and Depression      Long-Range [place]: Patient-Specific [place]   Start Date: 10/07/2020  Expected End Date: 10/07/2021  Recent Progress: On track  Priority: High  Note:   Current Barriers:  Unable to independently monitor therapeutic efficacy Unable to achieve control of diabetes or cholesterol  Unable to maintain control of blood pressure  Pharmacist Clinical Zheng(s):  Patient will achieve adherence to monitoring guidelines and medication adherence to achieve therapeutic efficacy achieve control of diabetes and cholesterol as evidenced by next A1c and lipid panel maintain control of blood pressure as evidenced by home and office BP readings  through collaboration with PharmD and provider.   Interventions: 1:1 collaboration with BBillie Ruddy MD  regarding development and update of comprehensive plan of care as evidenced by provider attestation and co-signature Inter-disciplinary care team collaboration (see longitudinal plan of care) Comprehensive medication review performed; medication list updated in electronic medical record  Hypertension (BP [place] <130/80) -Uncontrolled -Current treatment: Clonidine 0.1 mg 1 tablet twice daily - Appropriate, Query effective, Safe,  Accessible Spironolactone 25 mg 1/2 tablet daily - Appropriate, Query effective, Safe, Accessible Cartia XT 240 mg 1 capsule daily - PM - Appropriate, Query effective, Safe, Accessible -Medications previously tried: amlodipine (taking diltiazem), lisinopril (throat swelling) -Current home readings: 157/94 in office (owns an arm cuff now - it was 5/4 mmHg off from office cuff) -Current dietary habits: uses black pepper; no salt -Current exercise habits: not consistently -Denies hypotensive/hypertensive symptoms -Educated on BP goals and benefits of medications for prevention of heart attack, stroke and kidney damage; Exercise [place] of 150 minutes per week; Importance of home blood pressure monitoring; Proper BP monitoring technique; -Counseled to monitor BP at home twice weekly, document, and provide log at future appointments -Counseled on diet and exercise extensively Recommended to continue current medication -Recommended monitoring BP 3-4 times a week  Hyperlipidemia: (LDL [place] < 70) -Uncontrolled -Current treatment: Pravastatin 40 mg 1 tablet every evening - Appropriate, Query effective, Safe, Accessible  -Medications previously tried: none  -Current dietary patterns: chicken or pork chop for meat -Current exercise habits: not consistently; some PT exercises -Educated on Cholesterol goals;  Benefits of statin for ASCVD risk reduction; Importance of limiting foods high in cholesterol; Exercise [place] of 150 minutes per week; -Counseled on diet and exercise extensively Recommended increasing pravastatin to target [place] LDL  Diabetes (A1c [place] <7%) -Uncontrolled -Current medications: Invokana 100 mg 1 tablet daily - Appropriate, Query effective, Safe, Accessible Metformin 500 mg 1 tablet at bedtime - Appropriate, Query effective, Safe, Accessible Ozempic 0.25 mg inject once weekly - Appropriate, Query effective, Safe, Accessible -Medications previously tried: none  -Current home  glucose readings fasting glucose: 288 (ate cake yesterday), 122 post prandial glucose: not checking  -Denies hypoglycemic/hyperglycemic symptoms -Current meal patterns:  breakfast: no breakfast except on Thursdays from church (eggs, toast, ham)  lunch: sandwich, hot dogs, hamburgers dinner: beans or corn or green peas and potatoes with the TV dinners; hamburger or hot dog with bun, pork chops snacks: fruit; popsicles  drinks: ginger ale - two a week; lots of water; no juice  -Current exercise: doing some walking in her hall or a block outside (10-15 minutes) -Educated on A1c and blood sugar goals; Exercise [place] of 150 minutes per week; Benefits of routine self-monitoring of blood sugar; Carbohydrate counting and/or plate method -Counseled to check feet daily and get yearly eye exams -Counseled on diet and exercise extensively Recommended to continue current medication Educated on proper use and administration of Ozempic.  Heart Failure ([place]: manage symptoms and prevent exacerbations) -Controlled -Last ejection fraction: 60-65% (Date: 05/04/20) -HF type: Diastolic -NYHA Class: II (slight limitation of activity) -AHA HF Stage: C (Heart disease and symptoms present) -Current treatment: Furosemide 40 mg 1 tablet daily - Appropriate, Effective, Safe, Accessible Spironolactone 25 mg 1/2 tablet daily - Appropriate, Effective, Safe, Accessible -Medications previously tried: lisinopril -Current home BP/HR readings: 150-160s at home -Current dietary habits: limits salt intake -Current exercise habits: limited exercise -Educated on Importance of weighing daily; if you gain more than 3 pounds in one day or 5 pounds in one week, call cardiologist. Proper diuretic administration and potassium supplementation Importance of blood pressure control -Counseled on diet and  exercise extensively Recommended to continue current medication  COPD ([place]: control symptoms and prevent  exacerbations) -Controlled -Current treatment  Symbicort 2 puffs twice a day - Query Appropriate, Query effective, Safe, Accessible Albuterol nebulizer as needed - Appropriate, Effective, Safe, Accessible Albuterol HFA as needed - Appropriate, Effective, Safe, Accessible -Medications previously tried: unknown  -Gold Grade: Gold 2 (FEV1 50-79%) -Current COPD Classification:  B (high sx, <2 exacerbations/yr) -MMRC/CAT score: 1 -Pulmonary function testing: 07/2019 -Exacerbations requiring treatment in last 6 months: none -Patient reports consistent use of maintenance inhaler -Frequency of rescue inhaler use: rarely -Counseled on Proper inhaler technique; When to use rescue inhaler Differences between maintenance and rescue inhalers -Counseled on diet and exercise extensively Recommended to continue current medication  Depression/Anxiety ([place]: minimize symptoms) -Controlled -Current treatment: Duloxetine 30 mg 3 capsules daily - Appropriate, Effective, Safe, Accessible Quetiapine 50 mg 1 tablet twice daily - Appropriate, Effective, Safe, Accessible -Medications previously tried/failed: Rexulti -PHQ9: 0 -GAD7: 61 -Educated on Benefits of medication for symptom control Benefits of cognitive-behavioral therapy with or without medication -Recommended to continue current medication  Pain/fibromyalgia ([place]: minimize pain) -Uncontrolled -Current treatment  Diclofenac gel 1% as needed - Appropriate, Query effective, Safe, Accessible Gabapentin 300 mg 1 capsule twice daily - Appropriate, Query effective, Safe, Accessible -Medications previously tried: unknown  -Recommended to continue current medication  Allergic rhinitis ([place]: minimize symptoms) -Controlled -Current treatment  Flonase nasal spray as needed - Appropriate, Effective, Safe, Accessible Allegra 180 mg 1 tablet daily - Appropriate, Effective, Safe, Accessible -Medications previously tried: Xyzal  -Recommended to  continue current medication  Seizures ([place]: prevent seizures) -Controlled -Current treatment  Zonisamide 100 mg 1 capsule at breakfast and 2 capsules at bedtime - Appropriate, Effective, Safe, Accessible -Medications previously tried: none  -Recommended to continue current medication  Bilateral DVT ([place]: prevent blood clots) -Controlled -Current treatment  Warfarin 7.5 mg or 10 mg tablets as directed - Appropriate, Effective, Safe, Accessible -Medications previously tried: none  -Counseled on diet and exercise extensively Recommended to continue current medication  GERD ([place]: minimize heartburn/acid reflux) -Controlled -Current treatment  Omeprazole 20 mg 1 capsule twice daily - currently taking once daily -Medications previously tried: none  -Counseled on long term risks of taking PPIs. Recommended tapering and stopping omeprazole.   Health Maintenance -Vaccine gaps: shingrix, Prevnar 20, COVID booster -Current therapy:  Meclizine 25 mg as needed Linzess 145 mcg 1 capsule every other day Metoclopramide 10 mg as needed -Educated on Cost vs benefit of each product must be carefully weighed by individual consumer -Patient is satisfied with current therapy and denies issues -Recommended to continue current medication  Patient Goals/Self-Care Activities Patient will:  - take medications as prescribed focus on medication adherence by using adherence packaging check blood pressure twice weekly, document, and provide at future appointments target a minimum of 150 minutes of moderate intensity exercise weekly  Follow Up Plan: The care management team will reach out to the patient again over the next 21 days.           Medication Assistance: None required.  Patient affirms current coverage meets needs.  Compliance/Adherence/Medication fill history: Care Gaps: Shingrix, tetanus, Prevnar20, Hep C screening, mammogram, influenza, COVID booster Last BP - 150/90 on  01/13/2022 Last A1C - 9.5 on 01/02/2022  Star-Rating Drugs: Invokana 100 mg - last filled 11/24/2021 30 [facility] at [facility] Invokana 100 mg - last filled 12/28/2021 30 [facility] at [facility] Metformin 500 mg - last filled 12/28/2021 30 [facility] at [facility] Pravastatin 40 mg -  last filled 12/28/2021 30 [facility] at [facility] Semaglutide 11m/3ml - last filled 01/03/2022 42 [facility]  Patient's [facility] [place]:  [facility] [place] [address] [id number]: [phone]: [phone]  Uses pill box? Yes Pt endorses 80% compliance  We discussed: Benefits of medication synchronization, packaging and delivery as well as enhanced pharmacist oversight with [facility]. Patient decided to: Utilize [facility] pharmacy for medication synchronization, packaging and delivery  Care Plan and Follow Up Patient Decision:  Patient agrees to Care Plan and Follow-up.  Plan: Telephone follow up appointment with care management team member scheduled for:  3 months  MJeni Salles PharmD, BPoint IsabelPharmacist [place]at [phone]

## 2022-01-17 NOTE — Telephone Encounter (Signed)
This encounter was created in error - please disregard.

## 2022-01-23 DIAGNOSIS — I10 Essential (primary) hypertension: Secondary | ICD-10-CM | POA: Diagnosis not present

## 2022-01-23 DIAGNOSIS — G4733 Obstructive sleep apnea (adult) (pediatric): Secondary | ICD-10-CM | POA: Diagnosis not present

## 2022-01-26 DIAGNOSIS — G4733 Obstructive sleep apnea (adult) (pediatric): Secondary | ICD-10-CM | POA: Diagnosis not present

## 2022-01-27 DIAGNOSIS — J449 Chronic obstructive pulmonary disease, unspecified: Secondary | ICD-10-CM | POA: Diagnosis not present

## 2022-02-06 ENCOUNTER — Telehealth: Payer: Self-pay | Admitting: Pharmacist

## 2022-02-06 ENCOUNTER — Ambulatory Visit: Payer: Medicare Other | Admitting: Family Medicine

## 2022-02-06 NOTE — Chronic Care Management (AMB) (Signed)
    Chronic Care Management Pharmacy Assistant   Name: [name]  MRN: [id number] DOB: 04-Oct-1955  Reason for Encounter: Follow up Ozempic   Patient was reminded to increase dose of Ozempic pen to 0.5 mg on 02/07/22. Patient was remind to dial all the way up to the 0.5 on the pen. Patient voiced clear understanding.   Patient states she has been checking blood pressures and blood sugars, she has an appointment tomorrow 02/08/2022 with Dr. Volanda Napoleon and she states her readings are in her car to take with her to her appointment.  Patient states she will bring in a copy to be left for [place] Pharmacist.    Care Gaps: AWV - scheduled 02/24/2022 Last BP - 150/90 on 01/13/2022 Last A1C - 9.5 on 01/02/2022 Pneumonia - overdue Covid - overdue AWV - due soon Hep C Screen  - postponed Mammogram - postponed Shingrix - postponed Flu - postponed   Star Rating Drugs: Invokana 100 mg - last filled 01/23/2022 30 DS at [facility] Metformin 500 mg - last filled 01/23/2022 30 DS at [facility] Pravastatin 40 mg - last filled 01/23/2022 30 DS at [facility] '2mg'$ /3m - last filled 01/03/2022 42 DS at [id number]

## 2022-02-07 ENCOUNTER — Encounter (INDEPENDENT_AMBULATORY_CARE_PROVIDER_SITE_OTHER): Payer: Medicare Other | Admitting: Ophthalmology

## 2022-02-07 ENCOUNTER — Encounter: Payer: Self-pay | Admitting: Family Medicine

## 2022-02-07 DIAGNOSIS — E119 Type 2 diabetes mellitus without complications: Secondary | ICD-10-CM | POA: Diagnosis not present

## 2022-02-07 DIAGNOSIS — G4733 Obstructive sleep apnea (adult) (pediatric): Secondary | ICD-10-CM | POA: Diagnosis not present

## 2022-02-07 DIAGNOSIS — H43823 Vitreomacular adhesion, bilateral: Secondary | ICD-10-CM | POA: Diagnosis not present

## 2022-02-07 LAB — HM DIABETES EYE EXAM

## 2022-02-08 ENCOUNTER — Encounter: Payer: Self-pay | Admitting: Family Medicine

## 2022-02-08 ENCOUNTER — Ambulatory Visit (INDEPENDENT_AMBULATORY_CARE_PROVIDER_SITE_OTHER): Payer: Medicare Other | Admitting: Family Medicine

## 2022-02-08 VITALS — BP 136/82 | HR 92 | Temp 98.1°F | Wt 287.8 lb

## 2022-02-08 DIAGNOSIS — I10 Essential (primary) hypertension: Secondary | ICD-10-CM

## 2022-02-08 DIAGNOSIS — I5032 Chronic diastolic (congestive) heart failure: Secondary | ICD-10-CM

## 2022-02-08 DIAGNOSIS — E1140 Type 2 diabetes mellitus with diabetic neuropathy, unspecified: Secondary | ICD-10-CM | POA: Diagnosis not present

## 2022-02-08 MED ORDER — SEMAGLUTIDE (1 MG/DOSE) 4 MG/3ML ~~LOC~~ SOPN: 1.0000 mg | PEN_INJECTOR | SUBCUTANEOUS | 3 refills | Status: DC

## 2022-02-08 NOTE — Patient Instructions (Addendum)
Your hemoglobin A1c was 9.5% on 01/02/2022.  We can recheck this number in January.  Since you have started Ozempic it is likely to have improved as your blood sugars have been lower.  A new prescription for Ozempic 1 mg weekly was sent to your local pharmacy.  This is an increased dose from what you were taking previously.  Do not forget to call the breast center at [facility] to set up your mammogram.  They can be reached at [phone].

## 2022-02-08 NOTE — Progress Notes (Signed)
Subjective:    Patient ID: [name], female    DOB: 26-Jun-1955, 66 y.o.   MRN: [id number]  Chief Complaint  Patient presents with   Follow-up    DM, has started the injections    HPI Patient was seen today for f/u.  Pt started Ozempic 0.25 mg wkly.  BS are 140s-160s.  Lowest blood sugar reading 122.  Patient notes slightly decreased appetite yesterday after taking Ozempic.  Did not eat the whole container from the grocery store.  Bps 741O systolic per pt log.  Patient states she was checking blood pressure before taking medication.  Using wrist cuff.  Patient had eye exam done yesterday.  States notes will be faxed to clinic.  Patient plans to get flu and flu shot at upcoming cancer center appointment.  Has yet to schedule a mammogram, but has the information to do so.  Past Medical History:  Diagnosis Date   Adrenal mass ([place]) 03/226   Benign   Arthritis    knees/multiple orthopedic conditons; lower back   Asthma    per pt   Clotting disorder (HCC)    +beta-2-glycoprotein IgA antibody   COPD (chronic obstructive pulmonary disease) (HCC)    inhalers dependent on environment   Depression    Diabetes mellitus    120s usually fasting -  dx more than 10 yrs ago   Dizziness, nonspecific    DVT (deep venous thrombosis) (HCC)    Recurrent   Fibromyalgia    GERD (gastroesophageal reflux disease)    Heart murmur    History of blood transfusion    History of cocaine abuse ([place])    Remote history    Hyperlipidemia    Hypertension    takes meds daily   Hypothyroidism    Lupus Anticoagulant Positive    On home oxygen therapy    at night   Shortness of breath    exertion or lying flat   Sleep apnea    2l of oxygen at night (as of 12/6, she used to)    Allergies  Allergen Reactions   Lisinopril Swelling    THROAT SWELLING Pt states her throat started to "close up" after being on it for "so long."   Corticosteroids Rash    States she developed a rash on her tongue  after a steroid injection in her bac    ROS General: Denies fever, chills, night sweats, changes in weight, changes in appetite HEENT: Denies headaches, ear pain, changes in vision, rhinorrhea, sore throat CV: Denies CP, palpitations, SOB, orthopnea Pulm: Denies SOB, cough, wheezing GI: Denies abdominal pain, nausea, vomiting, diarrhea, constipation GU: Denies dysuria, hematuria, frequency, vaginal discharge Msk: Denies muscle cramps, joint pains Neuro: Denies weakness, numbness, tingling Skin: Denies rashes, bruising Psych: Denies depression, anxiety, hallucinations    Objective:    Blood pressure 136/82, pulse 92, temperature 98.1 F (36.7 C), temperature source Oral, weight 287 lb 12.8 oz (130.5 kg), SpO2 96 %.  Gen. Pleasant, well-nourished, in no distress, normal affect   HEENT: [place]/AT, face symmetric, conjunctiva clear, no scleral icterus, PERRLA, EOMI, nares patent without drainage Lungs: no accessory muscle use, CTAB, no wheezes or rales Cardiovascular: RRR, no m/r/g, no peripheral edema Abdomen: BS present, soft, NT/ND. Neuro:  A&Ox3, CN II-XII intact, normal gait Skin:  Warm, no lesions/ rash   Wt Readings from Last 3 Encounters:  02/08/22 287 lb 12.8 oz (130.5 kg)  01/13/22 288 lb (130.6 kg)  01/02/22 283 lb 12.8 oz (128.7 kg)  Lab Results  Component Value Date   WBC 5.7 04/21/2021   HGB 12.7 04/21/2021   HCT 39.4 04/21/2021   PLT 316.0 04/21/2021   GLUCOSE 205 (H) 01/02/2022   CHOL 194 02/02/2020   TRIG 196 (H) 02/02/2020   HDL 76 02/02/2020   LDLCALC 89 02/02/2020   ALT 19 01/02/2022   AST 16 01/02/2022   NA 139 01/02/2022   K 3.6 01/02/2022   CL 102 01/02/2022   CREATININE 0.73 01/02/2022   BUN 12 01/02/2022   CO2 25 01/02/2022   TSH 5.08 01/02/2022   INR 1.7 (H) 01/13/2022   HGBA1C 9.5 (H) 01/02/2022   MICROALBUR 40.3 (H) 01/02/2022    Assessment/Plan:  Type 2 diabetes mellitus with diabetic neuropathy, without long-term current use of  insulin (HCC)  -Hemoglobin A1c 9.5% on 01/02/2022 -Patient started semaglutide on 01/02/2022 and has since increased to 0.5 mg weekly.  Given continued elevation in blood sugar readings at home we will increase 0.0 mg weekly. -Continue other medications including metformin 500 mg nightly, Invokana 100 mg daily -Allergy to ACE I -Continue statin -Will update eye exam information once records received. - Plan: Semaglutide, 1 MG/DOSE, 4 MG/3ML SOPN  Essential hypertension -Controlled -Continue clonidine, Lasix 80 mg, spironolactone 12.5 mg -Continue lifestyle modifications and monitoring BP at home.  Will have patient check BP after taking medication -Continue follow-up with cardiology  Chronic heart failure with preserved ejection fraction (HCC) -Euvolemic on exam -Continue current medications -Continue follow-up with cardiology  F/u in 2-3 months  [name], MD

## 2022-02-13 ENCOUNTER — Other Ambulatory Visit: Payer: Self-pay

## 2022-02-13 ENCOUNTER — Inpatient Hospital Stay: Payer: Medicare Other | Attending: Oncology

## 2022-02-13 ENCOUNTER — Telehealth: Payer: Self-pay

## 2022-02-13 DIAGNOSIS — Z7901 Long term (current) use of anticoagulants: Secondary | ICD-10-CM

## 2022-02-13 DIAGNOSIS — Z86718 Personal history of other venous thrombosis and embolism: Secondary | ICD-10-CM | POA: Insufficient documentation

## 2022-02-13 LAB — PROTIME-INR
INR: 2.4 — ABNORMAL HIGH (ref 0.8–1.2)
Prothrombin Time: 26.1 seconds — ABNORMAL HIGH (ref 11.4–15.2)

## 2022-02-13 NOTE — Telephone Encounter (Signed)
Patiens gave verbal understanding and had no further question or concerns. She is schedule on 03/06/22

## 2022-02-13 NOTE — Telephone Encounter (Signed)
-----   Message from Ladell Pier, MD sent at 02/13/2022  1:25 PM EST ----- Please call patient, PT/INR is in the therapeutic range, continue Coumadin at same dose, repeat PT/INR 3-4 weeks

## 2022-02-14 ENCOUNTER — Other Ambulatory Visit: Payer: Self-pay | Admitting: Internal Medicine

## 2022-02-14 ENCOUNTER — Other Ambulatory Visit: Payer: Self-pay | Admitting: Family Medicine

## 2022-02-14 ENCOUNTER — Telehealth: Payer: Self-pay | Admitting: Pharmacist

## 2022-02-14 DIAGNOSIS — K5900 Constipation, unspecified: Secondary | ICD-10-CM

## 2022-02-14 NOTE — Chronic Care Management (AMB) (Unsigned)
Chronic Care Management Pharmacy Assistant   Name: [name]  MRN: [id number] DOB: [birth year]  Reason for Encounter: Medication Review / Medication Coordination Call   Recent office visits:  02/08/2022 [name] MD - Patient was seen for Type 2 diabetes mellitus with diabetic neuropathy, without long-term current use of insulin  and additional concerns. Increased Semaglutide to 85m weekly. Follow up in 2 months.   Recent consult visits:  None  Hospital visits:  None  Medications: Outpatient Encounter Medications as of 02/14/2022  Medication Sig   ACCU-CHEK GUIDE test strip USE TO check blood glucose UP TO four times daily AS DIRECTED   Accu-Chek Softclix Lancets lancets USE TO check blood glucose UP TO four times daily AS DIRECTED Needs appointment for further refills   albuterol (PROVENTIL) (2.5 MG/3ML) 0.083% nebulizer solution USE 1 VIAL IN NEBULIZER EVERY 6 HOURS - and as needed   albuterol (VENTOLIN HFA) 108 (90 Base) MCG/ACT inhaler Inhale 2 puffs into the lungs every 4 (four) hours as needed for shortness of breath.   blood glucose meter kit and supplies KIT Dispense based on patient and insurance preference. Use up to four times daily as directed. (FOR ICD-9 250.00, 250.01).   budesonide-formoterol (SYMBICORT) 160-4.5 MCG/ACT inhaler Inhale 2 puffs into the lungs 2 (two) times daily.   cloNIDine (CATAPRES) 0.1 MG tablet TAKE ONE TABLET BY MOUTH BEFORE BREAKFAST and TAKE ONE TABLET BY MOUTH EVERYDAY AT BEDTIME   diclofenac sodium (VOLTAREN) 1 % GEL Apply 2 g topically daily as needed (fibromyalgia pain).   diltiazem (CARDIZEM CD) 240 MG 24 hr capsule TAKE ONE CAPSULE BY MOUTH BEFORE BREAKFAST   DULoxetine (CYMBALTA) 30 MG capsule Take 90 mg by mouth at bedtime.   FEROSUL 325 (65 Fe) MG tablet TAKE ONE TABLET BY MOUTH ONCE DAILY   fexofenadine (ALLEGRA) 180 MG tablet TAKE ONE TABLET BY MOUTH ONCE DAILY   fluticasone (FLONASE) 50 MCG/ACT nasal spray Place 2  sprays into both nostrils 2 (two) times daily.   furosemide (LASIX) 80 MG tablet TAKE ONE TABLET BY MOUTH TWICE DAILY   gabapentin (NEURONTIN) 300 MG capsule TAKE ONE CAPSULE BY MOUTH TWICE DAILY   INVOKANA 100 MG TABS tablet TAKE ONE TABLET BY MOUTH BEFORE BREAKFAST   LINZESS 145 MCG CAPS capsule TAKE ONE CAPSULE BY MOUTH every other DAY   metFORMIN (GLUCOPHAGE) 500 MG tablet TAKE ONE TABLET BY MOUTH EVERYDAY AT BEDTIME   omeprazole (PRILOSEC) 20 MG capsule TAKE ONE CAPSULE BY MOUTH BEFORE BREAKFAST and TAKE ONE CAPSULE BY MOUTH EVERY EVENING   potassium chloride (MICRO-K) 10 MEQ CR capsule TAKE TWO CAPSULES BY MOUTH EVERYDAY AT BEDTIME   pravastatin (PRAVACHOL) 40 MG tablet TAKE ONE TABLET BY MOUTH EVERY EVENING   QUEtiapine (SEROQUEL) 50 MG tablet Take 50 mg by mouth 2 (two) times daily.   Semaglutide, 1 MG/DOSE, 4 MG/3ML SOPN Inject 1 mg as directed once a week.   spironolactone (ALDACTONE) 25 MG tablet TAKE 1/2 TABLET BY MOUTH ONCE DAILY   warfarin (COUMADIN) 10 MG tablet TAKE ONE TABLET BY MOUTH ONCE daily. EXCEPT MONDAY, WEDNESDAY, FRIDAY or as directed   warfarin (COUMADIN) 7.5 MG tablet TAKE ONE TABLET BY MOUTH ON Monday, Wednesday, AND Fridays   zonisamide (ZONEGRAN) 100 MG capsule Take by mouth.   zonisamide (ZONEGRAN) 50 MG capsule 1 TABLET AM AND 2 TABLETS PM   Facility-Administered Encounter Medications as of 02/14/2022  Medication   sodium chloride flush (NS) 0.9 % injection 3 mL  Reviewed chart for medication changes ahead of medication coordination call.  No OVs, Consults, or hospital visits since last care coordination call/Pharmacist visit. (If appropriate, list visit date, provider name)  No medication changes indicated OR if recent visit, treatment plan here.  BP Readings from Last 3 Encounters:  02/08/22 136/82  01/17/22 (!) 152/90  01/13/22 (!) 150/90    Lab Results  Component Value Date   HGBA1C 9.5 (H) 01/02/2022     Patient obtains medications through  Adherence Packaging  30 Days    Last adherence delivery included: Zonisamide 100 mg  - take 1 capsule at breakfast and 2 capsules at dinner Gabapentin 300 mg - take 1 capsule with breakfast and dinner Symbicort 160/4.5 - use 2 puff twice daily Spironolactone 25 mg - take 1/2 tablet at breakfast Furosemide 80 mg - take 1 tablet at breakfast and dinner Pravastatin 40 mg  - take 1 tablet at dinner Cartia XT 240 mg - take 1 capsule before breakfast Invokana 100 mg - take 1 tablet before breakfast Potassium CL ER 28mq -  take 2 capsules at bedtime Warfarin 10 mg - take 1 tablet daily or as directed Metformin 500 mg - take 1 tablet at bedtime Fexofenadine 180 mg once daily at breakfast  Duloxetine 30 mg  - take 3 capsules at bedtime  Quetiapine 50 mg - take 1 tablet at breakfast and bedtime  Clonidine 0.1 mg - take 1 tablet before breakfast and bedtime Accu-chek test strips Accu-chek softclix lancets Albuterol HFA 2 puffs every 4 hours as needed Linzess 145 mcg - Take one capsule every other day Omeprazole - 20 mg - take 1 capsule before breakfast and dinner Warfarin 7.5 mg - take 1 tablet Mon-Wed-Fri or as directed   Patient declined (meds) last month:  No denied medications     Patient is due for next adherence delivery on: 02/27/2022   Called patient and reviewed medications and coordinated delivery.     This delivery to include: Zonisamide 100 mg  - take 1 capsule at breakfast and 2 capsules at dinner Gabapentin 300 mg - take 1 capsule with breakfast and dinner Symbicort 160/4.5 - use 2 puff twice daily Spironolactone 25 mg - take 1/2 tablet at breakfast Furosemide 80 mg - take 1 tablet at breakfast and dinner Pravastatin 40 mg  - take 1 tablet at dinner Cartia XT 240 mg - take 1 capsule before breakfast Invokana 100 mg - take 1 tablet before breakfast Potassium CL ER 121m -  take 2 capsules at bedtime Warfarin 10 mg - take 1 tablet daily or as directed Metformin 500 mg -  take 1 tablet at bedtime Fexofenadine 180 mg once daily at breakfast  Duloxetine 30 mg  - take 3 capsules at bedtime  Quetiapine 50 mg - take 1 tablet at breakfast and bedtime  Clonidine 0.1 mg - take 1 tablet before breakfast and bedtime Accu-chek test strips Accu-chek softclix lancets Albuterol HFA 2 puffs every 4 hours as needed Linzess 145 mcg - Take one capsule every other day Omeprazole - 20 mg - take 1 capsule before breakfast and dinner Warfarin 7.5 mg - take 1 tablet Mon-Wed-Fri or as directed   Patient will need a short fill: No short fill needed   Coordinated acute fill: No acute fill needed.   Patient declined the following medications: No medications declined  [facility] Semaglutide filled 02/08/22 28 DS     Confirmed delivery date of 02/27/2022,  advised patient that pharmacy will contact them the morning of delivery.   Care Gaps: AWV - scheduled 02/24/2022 Last BP - 136/802 on 02/08/2022 Last A1C - 9.5 on 01/02/2022 Pneumonia - overdue Covid - overdue AWV - due soon Hep C Screen - postponed Mammogram - postponed Shingrix - postponed Flu - postponed  Star Rating Drugs: Invokana 100 mg - last filled 01/23/2022 30 DS at [facility] Metformin 500 mg - last filled 01/23/2022 30 DS at [facility] Pravastatin 40 mg - last filled 01/23/2022 30 DS at [facility] Semaglutide 36m/3ml - last filled 02/08/2022 28 [phone]

## 2022-02-24 ENCOUNTER — Telehealth: Payer: Self-pay

## 2022-02-24 NOTE — Telephone Encounter (Signed)
Patient called front office and canceled AWV. Due to emergency.

## 2022-02-27 DIAGNOSIS — J449 Chronic obstructive pulmonary disease, unspecified: Secondary | ICD-10-CM | POA: Diagnosis not present

## 2022-03-03 ENCOUNTER — Ambulatory Visit (INDEPENDENT_AMBULATORY_CARE_PROVIDER_SITE_OTHER): Payer: Medicare Other

## 2022-03-03 VITALS — Ht 66.0 in | Wt 287.0 lb

## 2022-03-03 DIAGNOSIS — Z0000 Encounter for general adult medical examination without abnormal findings: Secondary | ICD-10-CM

## 2022-03-03 NOTE — Patient Instructions (Addendum)
[name] , Thank you for taking time to come for your Medicare Wellness Visit. I appreciate your ongoing commitment to your health goals. Please review the following plan we discussed and let me know if I can assist you in the future.   These are the goals we discussed:  Goals       Exercise 3x per week (30 min per time)      Increase physical activity      Patient states would like to increase walking exercise.      Lose weight (pt-stated)      Weight (lb) < 230 lb (104.3 kg)        This is a list of the screening recommended for you and due dates:  Health Maintenance  Topic Date Due   DTaP/Tdap/Td vaccine (1 - Tdap) Never done   Mammogram  03/08/2022*   COVID-19 Vaccine (5 - 2023-24 season) 03/19/2022*   Zoster (Shingles) Vaccine (1 of 2) 06/07/2022*   Flu Shot  06/19/2022*   Pneumonia Vaccine (2 - PCV) 03/04/2023*   Hepatitis C Screening: USPSTF Recommendation to screen - Ages 18-79 yo.  03/04/2023*   Hemoglobin A1C  07/04/2022   Complete foot exam   12/07/2022   Yearly kidney function blood test for diabetes  01/03/2023   Yearly kidney health urinalysis for diabetes  01/03/2023   Eye exam for diabetics  02/08/2023   Medicare Annual Wellness Visit  03/04/2023   Colon Cancer Screening  04/20/2027   DEXA scan (bone density measurement)  Completed   HPV Vaccine  Aged Out  *Topic was postponed. The date shown is not the original due date.    Advanced directives: Advance directive discussed with you today. Even though you declined this today, please call our office should you change your mind, and we can give you the proper paperwork for you to fill out.   Conditions/risks identified: None  Next appointment: Follow up in one year for your annual wellness visit     Preventive Care 65 Years and Older, Female Preventive care refers to lifestyle choices and visits with your health care provider that can promote health and wellness. What does preventive care include? A yearly  physical exam. This is also called an annual well check. Dental exams once or twice a year. Routine eye exams. Ask your health care provider how often you should have your eyes checked. Personal lifestyle choices, including: Daily care of your teeth and gums. Regular physical activity. Eating a healthy diet. Avoiding tobacco and drug use. Limiting alcohol use. Practicing safe sex. Taking low-dose aspirin every day. Taking vitamin and mineral supplements as recommended by your health care provider. What happens during an annual well check? The services and screenings done by your health care provider during your annual well check will depend on your age, overall health, lifestyle risk factors, and family history of disease. Counseling  Your health care provider may ask you questions about your: Alcohol use. Tobacco use. Drug use. Emotional well-being. Home and relationship well-being. Sexual activity. Eating habits. History of falls. Memory and ability to understand (cognition). Work and work Statistician. Reproductive health. Screening  You may have the following tests or measurements: Height, weight, and BMI. Blood pressure. Lipid and cholesterol levels. These may be checked every 5 years, or more frequently if you are over 47 years old. Skin check. Lung cancer screening. You may have this screening every year starting at age 31 if you have a 30-pack-year history of smoking and currently  smoke or have quit within the past 15 years. Fecal occult blood test (FOBT) of the stool. You may have this test every year starting at age 32. Flexible sigmoidoscopy or colonoscopy. You may have a sigmoidoscopy every 5 years or a colonoscopy every 10 years starting at age 65. Hepatitis C blood test. Hepatitis B blood test. Sexually transmitted disease (STD) testing. Diabetes screening. This is done by checking your blood sugar (glucose) after you have not eaten for a while (fasting). You may  have this done every 1-3 years. Bone density scan. This is done to screen for osteoporosis. You may have this done starting at age 14. Mammogram. This may be done every 1-2 years. Talk to your health care provider about how often you should have regular mammograms. Talk with your health care provider about your test results, treatment options, and if necessary, the need for more tests. Vaccines  Your health care provider may recommend certain vaccines, such as: Influenza vaccine. This is recommended every year. Tetanus, diphtheria, and acellular pertussis (Tdap, Td) vaccine. You may need a Td booster every 10 years. Zoster vaccine. You may need this after age 52. Pneumococcal 13-valent conjugate (PCV13) vaccine. One dose is recommended after age 69. Pneumococcal polysaccharide (PPSV23) vaccine. One dose is recommended after age 77. Talk to your health care provider about which screenings and vaccines you need and how often you need them. This information is not intended to replace advice given to you by your health care provider. Make sure you discuss any questions you have with your health care provider. Document Released: 04/02/2015 Document Revised: 11/24/2015 Document Reviewed: 01/05/2015 Elsevier Interactive Patient Education  2017 [place] Prevention in the Home Falls can cause injuries. They can happen to people of all ages. There are many things you can do to make your home safe and to help prevent falls. What can I do on the outside of my home? Regularly fix the edges of walkways and driveways and fix any cracks. Remove anything that might make you trip as you walk through a door, such as a raised step or threshold. Trim any bushes or trees on the path to your home. Use bright outdoor lighting. Clear any walking paths of anything that might make someone trip, such as rocks or tools. Regularly check to see if handrails are loose or broken. Make sure that both sides of any  steps have handrails. Any raised decks and porches should have guardrails on the edges. Have any leaves, snow, or ice cleared regularly. Use sand or salt on walking paths during winter. Clean up any spills in your garage right away. This includes oil or grease spills. What can I do in the bathroom? Use night lights. Install grab bars by the toilet and in the tub and shower. Do not use towel bars as grab bars. Use non-skid mats or decals in the tub or shower. If you need to sit down in the shower, use a plastic, non-slip stool. Keep the floor dry. Clean up any water that spills on the floor as soon as it happens. Remove soap buildup in the tub or shower regularly. Attach bath mats securely with double-sided non-slip rug tape. Do not have throw rugs and other things on the floor that can make you trip. What can I do in the bedroom? Use night lights. Make sure that you have a light by your bed that is easy to reach. Do not use any sheets or blankets that are too big  for your bed. They should not hang down onto the floor. Have a firm chair that has side arms. You can use this for support while you get dressed. Do not have throw rugs and other things on the floor that can make you trip. What can I do in the kitchen? Clean up any spills right away. Avoid walking on wet floors. Keep items that you use a lot in easy-to-reach places. If you need to reach something above you, use a strong step stool that has a grab bar. Keep electrical cords out of the way. Do not use floor polish or wax that makes floors slippery. If you must use wax, use non-skid floor wax. Do not have throw rugs and other things on the floor that can make you trip. What can I do with my stairs? Do not leave any items on the stairs. Make sure that there are handrails on both sides of the stairs and use them. Fix handrails that are broken or loose. Make sure that handrails are as long as the stairways. Check any carpeting to  make sure that it is firmly attached to the stairs. Fix any carpet that is loose or worn. Avoid having throw rugs at the top or bottom of the stairs. If you do have throw rugs, attach them to the floor with carpet tape. Make sure that you have a light switch at the top of the stairs and the bottom of the stairs. If you do not have them, ask someone to add them for you. What else can I do to help prevent falls? Wear shoes that: Do not have high heels. Have rubber bottoms. Are comfortable and fit you well. Are closed at the toe. Do not wear sandals. If you use a stepladder: Make sure that it is fully opened. Do not climb a closed stepladder. Make sure that both sides of the stepladder are locked into place. Ask someone to hold it for you, if possible. Clearly mark and make sure that you can see: Any grab bars or handrails. First and last steps. Where the edge of each step is. Use tools that help you move around (mobility aids) if they are needed. These include: Canes. Walkers. Scooters. Crutches. Turn on the lights when you go into a dark area. Replace any light bulbs as soon as they burn out. Set up your furniture so you have a clear path. Avoid moving your furniture around. If any of your floors are uneven, fix them. If there are any pets around you, be aware of where they are. Review your medicines with your doctor. Some medicines can make you feel dizzy. This can increase your chance of falling. Ask your doctor what other things that you can do to help prevent falls. This information is not intended to replace advice given to you by your health care provider. Make sure you discuss any questions you have with your health care provider. Document Released: 12/31/2008 Document Revised: 08/12/2015 Document Reviewed: 04/10/2014 Elsevier Interactive Patient Education  2017 [employer].

## 2022-03-03 NOTE — Progress Notes (Signed)
Subjective:   [name] is a 66 y.o. female who presents for Medicare Annual (Subsequent) preventive examination.  Review of Systems    Virtual Visit via Telephone Note  I connected with  [name] on 03/03/22 at  9:45 AM EST by telephone and verified that I am speaking with the correct person using two identifiers.  Location: Patient: Home Provider: Office Persons participating in the virtual visit: patient/Nurse Health Advisor   I discussed the limitations, risks, security and privacy concerns of performing an evaluation and management service by telephone and the availability of in person appointments. The patient expressed understanding and agreed to proceed.  Interactive audio and video telecommunications were attempted between this nurse and patient, however failed, due to patient having technical difficulties OR patient did not have access to video capability.  We continued and completed visit with audio only.  Some vital signs may be absent or patient reported.   Criselda Peaches, LPN  Cardiac Risk Factors include: advanced age (>16mn, >>22women);diabetes mellitus;hypertension     Objective:    Today's Vitals   03/03/22 0949  Weight: 287 lb (130.2 kg)  Height: _0  (1.676 m)   Body mass index is 46.32 kg/m.     03/03/2022   10:04 AM 01/13/2022    8:52 AM 10/13/2021   10:07 AM 07/14/2021    8:19 AM 03/02/2021    3:50 PM 02/28/2021    6:25 AM 02/19/2021    2:33 AM  Advanced Directives  Does Patient Have a Medical Advance Directive? _1  No No  Would patient like information on creating a medical advance directive? No - Patient declined No - Patient declined No - Patient declined No - Patient declined No - Patient declined No - Patient declined No - Patient declined    Current Medications (verified) Outpatient Encounter Medications as of 03/03/2022  Medication Sig   ACCU-CHEK GUIDE test strip USE TO check blood glucose UP TO four  times daily AS DIRECTED   Accu-Chek Softclix Lancets lancets USE TO check blood glucose UP TO four times daily AS DIRECTED Needs appointment for further refills   albuterol (PROVENTIL) (2.5 MG/3ML) 0.083% nebulizer solution USE 1 VIAL IN NEBULIZER EVERY 6 HOURS - and as needed   albuterol (VENTOLIN HFA) 108 (90 Base) MCG/ACT inhaler Inhale 2 puffs into the lungs every 4 (four) hours as needed for shortness of breath.   blood glucose meter kit and supplies KIT Dispense based on patient and insurance preference. Use up to four times daily as directed. (FOR ICD-9 250.00, 250.01).   budesonide-formoterol (SYMBICORT) 160-4.5 MCG/ACT inhaler Inhale 2 puffs into the lungs 2 (two) times daily.   cloNIDine (CATAPRES) 0.1 MG tablet TAKE ONE TABLET BY MOUTH BEFORE BREAKFAST and TAKE ONE TABLET BY MOUTH EVERYDAY AT BEDTIME   diclofenac sodium (VOLTAREN) 1 % GEL Apply 2 g topically daily as needed (fibromyalgia pain).   diltiazem (CARDIZEM CD) 240 MG 24 hr capsule TAKE ONE CAPSULE BY MOUTH BEFORE BREAKFAST   DULoxetine (CYMBALTA) 30 MG capsule Take 90 mg by mouth at bedtime.   FEROSUL 325 (65 Fe) MG tablet TAKE ONE TABLET BY MOUTH ONCE DAILY   fexofenadine (ALLEGRA) 180 MG tablet TAKE ONE TABLET BY MOUTH ONCE DAILY   fluticasone (FLONASE) 50 MCG/ACT nasal spray Place 2 sprays into both nostrils 2 (two) times daily.   furosemide (LASIX) 80 MG tablet TAKE ONE TABLET BY MOUTH TWICE DAILY   gabapentin (NEURONTIN) 300 MG  capsule TAKE ONE CAPSULE BY MOUTH TWICE DAILY   INVOKANA 100 MG TABS tablet TAKE ONE TABLET BY MOUTH BEFORE BREAKFAST   LINZESS 145 MCG CAPS capsule TAKE ONE CAPSULE BY MOUTH every other DAY   metFORMIN (GLUCOPHAGE) 500 MG tablet TAKE ONE TABLET BY MOUTH EVERYDAY AT BEDTIME   omeprazole (PRILOSEC) 20 MG capsule TAKE ONE CAPSULE BY MOUTH BEFORE BREAKFAST and TAKE ONE CAPSULE BY MOUTH EVERY EVENING   potassium chloride (MICRO-K) 10 MEQ CR capsule TAKE TWO CAPSULES BY MOUTH EVERYDAY AT BEDTIME    pravastatin (PRAVACHOL) 40 MG tablet TAKE ONE TABLET BY MOUTH EVERY EVENING   QUEtiapine (SEROQUEL) 50 MG tablet Take 50 mg by mouth 2 (two) times daily.   Semaglutide, 1 MG/DOSE, 4 MG/3ML SOPN Inject 1 mg as directed once a week.   spironolactone (ALDACTONE) 25 MG tablet TAKE 1/2 TABLET BY MOUTH ONCE DAILY   warfarin (COUMADIN) 10 MG tablet TAKE ONE TABLET BY MOUTH ONCE daily. EXCEPT MONDAY, WEDNESDAY, FRIDAY or as directed   warfarin (COUMADIN) 7.5 MG tablet TAKE ONE TABLET BY MOUTH ON Monday, Wednesday, AND Fridays   zonisamide (ZONEGRAN) 100 MG capsule Take by mouth.   zonisamide (ZONEGRAN) 50 MG capsule 1 TABLET AM AND 2 TABLETS PM   Facility-Administered Encounter Medications as of 03/03/2022  Medication   sodium chloride flush (NS) 0.9 % injection 3 mL    Allergies (verified) Lisinopril and Corticosteroids   History: Past Medical History:  Diagnosis Date   Adrenal mass ([place]) 03/226   Benign   Arthritis    knees/multiple orthopedic conditons; lower back   Asthma    per pt   Clotting disorder (HCC)    +beta-2-glycoprotein IgA antibody   COPD (chronic obstructive pulmonary disease) (HCC)    inhalers dependent on environment   Depression    Diabetes mellitus    120s usually fasting -  dx more than 10 yrs ago   Dizziness, nonspecific    DVT (deep venous thrombosis) (HCC)    Recurrent   Fibromyalgia    GERD (gastroesophageal reflux disease)    Heart murmur    History of blood transfusion    History of cocaine abuse ([place])    Remote history    Hyperlipidemia    Hypertension    takes meds daily   Hypothyroidism    Lupus Anticoagulant Positive    On home oxygen therapy    at night   Shortness of breath    exertion or lying flat   Sleep apnea    2l of oxygen at night (as of 12/6, she used to)   Past Surgical History:  Procedure Laterality Date   ABDOMINAL HYSTERECTOMY  1989   Fibroids   ANTERIOR LUMBAR FUSION  01/03/2012   Procedure: ANTERIOR LUMBAR FUSION 1  LEVEL;  Surgeon: Winfield Cunas, MD;  Location: [facility];  Service: Neurosurgery;  Laterality: N/A;  Lumbar Four-Five Anterior Lumbar Interbody Fusion with Instrumentation   BACK SURGERY     cervical spine---disk disease   CARPAL TUNNEL RELEASE     Bilateral   CESAREAN SECTION     CHOLECYSTECTOMY     CYSTOSCOPY/RETROGRADE/URETEROSCOPY/STONE EXTRACTION WITH BASKET Right 04/26/2014   Procedure: CYSTOSCOPY/ RIGHT RETROGRADE/ RIGHT URETEROSCOPY/URETERAL AND RENAL PELVIS BIOPSY;  Surgeon: Alexis Frock, MD;  Location: [facility];  Service: Urology;  Laterality: Right;   gallstones removed     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   bilateral   right elbow surgery     RIGHT HEART CATH N/A  02/28/2021   Procedure: RIGHT HEART CATH;  Surgeon: Burnell Blanks, MD;  Location: [facility];  Service: Cardiovascular;  Laterality: N/A;   THYROID LOBECTOMY Right 02/27/2017   THYROID LOBECTOMY Right 02/27/2017   Procedure: RIGHT THYROID LOBECTOMY;  Surgeon: Erroll Luna, MD;  Location: [facility];  Service: General;  Laterality: Right;   TUBAL LIGATION     Family History  Problem Relation Age of Onset   Hypertension Mother    Diabetes Mother    Cancer Mother        Pancreatic   Hypertension Father    Heart failure Father    Hyperlipidemia Other    COPD Other    Social History   Socioeconomic History   Marital status: Divorced    Spouse name: Not on file   Number of children: Not on file   Years of education: Not on file   Highest education level: Not on file  Occupational History   Not on file  Tobacco Use   Smoking status: Never   Smokeless tobacco: Never  Vaping Use   Vaping Use: Never used  Substance and Sexual Activity   Alcohol use: No    Alcohol/week: 0.0 standard drinks of alcohol    Comment: beer in past    Drug use: Yes    Types: Cocaine    Comment: quit 2003-rehab progam in 2005   Sexual activity: Never  Other Topics Concern   Not on file  Social History  Narrative   Lives alone in apartment, drives   Social Determinants of Health   Financial Resource Strain: Low Risk  (03/03/2022)   Overall Financial Resource Strain (CARDIA)    Difficulty of Paying Living Expenses: Not hard at all  Food Insecurity: No Food Insecurity (03/03/2022)   Hunger Vital Sign    Worried About Running Out of Food in the Last Year: Never true    Ran Out of Food in the Last Year: Never true  Transportation Needs: No Transportation Needs (03/03/2022)   PRAPARE - Hydrologist (Medical): No    Lack of Transportation (Non-Medical): No  Physical Activity: Insufficiently Active (03/03/2022)   Exercise Vital Sign    Days of Exercise per Week: 7 days    Minutes of Exercise per Session: 10 min  Stress: No Stress Concern Present (03/03/2022)   [place]    Feeling of Stress : Not at all  Social Connections: Moderately Integrated (03/03/2022)   Social Connection and Isolation Panel [NHANES]    Frequency of Communication with Friends and Family: More than three times a week    Frequency of Social Gatherings with Friends and Family: More than three times a week    Attends Religious Services: More than 4 times per year    Active Member of [employer] or Organizations: Yes    Attends Music therapist: More than 4 times per year    Marital Status: Divorced    Tobacco Counseling Counseling given: Not Answered   Clinical Intake:  Pre-visit preparation completed: No  Pain : No/denies pain    Nutrition Risk Assessment:  Has the patient had any N/V/D within the last 2 months?  No  Does the patient have any non-healing wounds?  No  Has the patient had any unintentional weight loss or weight gain?  No   Diabetes:  Is the patient diabetic?  Yes  If diabetic, was a CBG obtained today?  Yes CBG 169 Taken by patient Did the patient bring in their glucometer from home?   No  How often do you monitor your CBG's? Daily.   Financial Strains and Diabetes Management:  Are you having any financial strains with the device, your supplies or your medication? No .  Does the patient want to be seen by Chronic Care Management for management of their diabetes?  No  Would the patient like to be referred to a Nutritionist or for Diabetic Management?  No   Diabetic Exams:  Diabetic Eye Exam: Completed No. Overdue for diabetic eye exam. Pt has been advised about the importance in completing this exam. A referral has been placed today. Message sent to referral coordinator for scheduling purposes. Advised pt to expect a call from office referred to regarding appt.  Diabetic Foot Exam: Completed No. Pt has been advised about the importance in completing this exam. Pt is scheduled for diabetic foot exam on Followed by PCP.   BMI - recorded: 46.32 Nutritional Status: BMI > 30  Obese Nutritional Risks: None Diabetes: Yes CBG done?: Yes CBG resulted in Enter/ Edit results?: Yes (CBG 169 Takenby patient) Did pt. bring in CBG monitor from home?: No  How often do you need to have someone help you when you read instructions, pamphlets, or other written materials from your doctor or pharmacy?: 4 - Often (Aide assist)  Diabetic?  Yes  Interpreter Needed?: No  Information entered by :: Rolene Arbour LPN   Activities of Daily Living    03/03/2022   10:01 AM  In your present state of health, do you have any difficulty performing the following activities:  Hearing? 0  Vision? 0  Difficulty concentrating or making decisions? 0  Walking or climbing stairs? 1  Comment Uses cane and walker  Dressing or bathing? 0  Doing errands, shopping? 0  Preparing Food and eating ? N  Using the Toilet? N  In the past six months, have you accidently leaked urine? Y  Comment Wears panty liners. Followed by PCP  Do you have problems with loss of bowel control? N  Managing your  Medications? N  Managing your Finances? N  Housekeeping or managing your Housekeeping? N    Patient Care Team: Billie Ruddy, MD as PCP - General (Family Medicine) Werner Lean, MD as PCP - Cardiology (Cardiology) Viona Gilmore, [facility] as Pharmacist (Pharmacist)  Indicate any recent Medical Services you may have received from other than [facility] providers in the past year (date may be approximate).     Assessment:   This is a routine wellness examination for [facility].  Hearing/Vision screen Hearing Screening - Comments:: Denies hearing difficulties   Vision Screening - Comments:: Wears rx glasses - up to date with routine eye exams with  Dr Zadie Rhine  Dietary issues and exercise activities discussed: Exercise limited by: None identified   Goals Addressed               This Visit's Progress     Lose weight (pt-stated)         Depression Screen    03/03/2022    9:58 AM 02/08/2022   10:42 AM 01/02/2022    2:16 PM 12/07/2021    2:35 PM 08/11/2021   11:45 AM 03/02/2021    3:25 PM 11/03/2020    8:22 AM  PHQ 2/9 Scores  PHQ - 2 Score _0 0  PHQ- 9 Score _1 4  Fall Risk    03/03/2022   10:03 AM 02/08/2022   10:42 AM 01/02/2022    2:16 PM 08/11/2021   11:44 AM 03/02/2021    3:40 PM  Fall Risk   Falls in the past year? 0 0 0 0 0  Number falls in past yr: 0 0 0 0 0  Injury with Fall? 0 0 0 0 0  Risk for fall due to : No Fall Risks No Fall Risks No Fall Risks No Fall Risks   Follow up Falls prevention discussed Falls evaluation completed Falls evaluation completed Falls evaluation completed     [place]:  Any stairs in or around the home? Yes  If so, are there any without handrails? No  Home free of loose throw rugs in walkways, pet beds, electrical cords, etc? Yes  Adequate lighting in your home to reduce risk of falls? Yes   ASSISTIVE DEVICES UTILIZED TO PREVENT FALLS:  Life alert? No  Use of a cane,  walker or w/c? Yes  Grab bars in the bathroom? Yes  Shower chair or bench in shower? Yes  Elevated toilet seat or a handicapped toilet? Yes   TIMED UP AND GO:  Was the test performed? No . Audio Visit   Cognitive Function:        03/03/2022   10:04 AM 03/02/2021    3:47 PM 03/05/2020   11:12 AM  6CIT Screen  What Year? 0 points 0 points 0 points  What month? 0 points 0 points 0 points  What time? 0 points 0 points 0 points  Count back from 20 0 points  0 points  Months in reverse 0 points 0 points 2 points  Repeat phrase 0 points 0 points 10 points  Total Score 0 points  12 points    Immunizations Immunization History  Administered Date(s) Administered   Influenza Split 01/08/2012   Influenza,inj,Quad PF,6+ Mos 12/31/2018   Influenza-Unspecified 12/18/2020   PFIZER Comirnaty(Gray Top)Covid-19 Tri-Sucrose Vaccine 11/12/2020   PFIZER(Purple Top)SARS-COV-2 Vaccination 06/30/2019, 07/21/2019   Pfizer Covid-19 Vaccine Bivalent Booster 34yr & up 02/14/2021   Pneumococcal Polysaccharide-23 01/08/2012    TDAP status: Due, Education has been provided regarding the importance of this vaccine. Advised may receive this vaccine at local pharmacy or Health Dept. Aware to provide a copy of the vaccination record if obtained from local pharmacy or Health Dept. Verbalized acceptance and understanding.  Flu Vaccine status: Due, Education has been provided regarding the importance of this vaccine. Advised may receive this vaccine at local pharmacy or Health Dept. Aware to provide a copy of the vaccination record if obtained from local pharmacy or Health Dept. Verbalized acceptance and understanding.  Pneumococcal vaccine status: Due, Education has been provided regarding the importance of this vaccine. Advised may receive this vaccine at local pharmacy or Health Dept. Aware to provide a copy of the vaccination record if obtained from local pharmacy or Health Dept. Verbalized acceptance and  understanding.  Covid-19 vaccine status: Completed vaccines  Qualifies for Shingles Vaccine? Yes   Zostavax completed No   Shingrix Completed?: No.    Education has been provided regarding the importance of this vaccine. Patient has been advised to call insurance company to determine out of pocket expense if they have not yet received this vaccine. Advised may also receive vaccine at local pharmacy or Health Dept. Verbalized acceptance and understanding.  Screening Tests Health Maintenance  Topic Date Due   DTaP/Tdap/Td (1 - Tdap) Never  done   MAMMOGRAM  03/08/2022 (Originally 09/30/2021)   COVID-19 Vaccine (5 - 2023-24 season) 03/19/2022 (Originally 11/18/2021)   Zoster Vaccines- Shingrix (1 of 2) 06/07/2022 (Originally 06/05/1974)   INFLUENZA VACCINE  06/19/2022 (Originally 10/18/2021)   Pneumonia Vaccine 102+ Years old (2 - PCV) 03/04/2023 (Originally 06/04/2020)   Hepatitis C Screening  03/04/2023 (Originally 06/04/1973)   HEMOGLOBIN A1C  07/04/2022   FOOT EXAM  12/07/2022   Diabetic kidney evaluation - eGFR measurement  01/03/2023   Diabetic kidney evaluation - Urine ACR  01/03/2023   OPHTHALMOLOGY EXAM  02/08/2023   Medicare Annual Wellness (AWV)  03/04/2023   COLONOSCOPY (Pts 45-67yr Insurance coverage will need to be confirmed)  04/20/2027   DEXA SCAN  Completed   HPV VACCINES  Aged Out    Health Maintenance  Health Maintenance Due  Topic Date Due   DTaP/Tdap/Td (1 - Tdap) Never done    Colorectal cancer screening: Type of screening: Colonoscopy. Completed 04/19/17. Repeat every 10 years  Mammogram status: Ordered Deferred. Pt provided with contact info and advised to call to schedule appt.   Bone Density status: Completed 02/18/16. Results reflect: Bone density results: OSTEOPOROSIS. Repeat every   years.  Lung Cancer Screening: (Low Dose CT Chest recommended if Age 66-80years, 30 pack-year currently smoking OR have quit w/in 15years.) does not qualify.     Additional  Screening:  Hepatitis C Screening: does qualify;  Deferred  Vision Screening: Recommended annual ophthalmology exams for early detection of glaucoma and other disorders of the eye. Is the patient up to date with their annual eye exam?  Yes  Who is the provider or what is the name of the office in which the patient attends annual eye exams? Dr RZadie RhineIf pt is not established with a provider, would they like to be referred to a provider to establish care? No .   Dental Screening: Recommended annual dental exams for proper oral hygiene  Community Resource Referral / Chronic Care Management:  CRR required this visit?  No   CCM required this visit?  No      Plan:     I have personally reviewed and noted the following in the patient's chart:   Medical and social history Use of alcohol, tobacco or illicit drugs  Current medications and supplements including opioid prescriptions. Patient is not currently taking opioid prescriptions. Functional ability and status Nutritional status Physical activity Advanced directives List of other physicians Hospitalizations, surgeries, and ER visits in previous 12 months Vitals Screenings to include cognitive, depression, and falls Referrals and appointments  In addition, I have reviewed and discussed with patient certain preventive protocols, quality metrics, and best practice recommendations. A written personalized care plan for preventive services as well as general preventive health recommendations were provided to patient.     BCriselda Peaches LPN   [id number]  Nurse Notes: Patient due Hep-C Screening

## 2022-03-06 ENCOUNTER — Inpatient Hospital Stay: Payer: Medicare Other | Attending: Oncology

## 2022-03-06 DIAGNOSIS — Z7901 Long term (current) use of anticoagulants: Secondary | ICD-10-CM | POA: Diagnosis not present

## 2022-03-06 DIAGNOSIS — Z86718 Personal history of other venous thrombosis and embolism: Secondary | ICD-10-CM | POA: Diagnosis not present

## 2022-03-06 LAB — PROTIME-INR
INR: 3.3 — ABNORMAL HIGH (ref 0.8–1.2)
Prothrombin Time: 33.1 seconds — ABNORMAL HIGH (ref 11.4–15.2)

## 2022-03-07 ENCOUNTER — Other Ambulatory Visit: Payer: Self-pay

## 2022-03-07 ENCOUNTER — Telehealth: Payer: Self-pay

## 2022-03-07 DIAGNOSIS — I82403 Acute embolism and thrombosis of unspecified deep veins of lower extremity, bilateral: Secondary | ICD-10-CM

## 2022-03-07 NOTE — Telephone Encounter (Signed)
Patient gave verbal understanding and had no further questions or concerns at this time 

## 2022-03-07 NOTE — Telephone Encounter (Signed)
-----   Message from Colleen Pier, MD sent at 03/06/2022  7:59 PM EST ----- Please call patient, same coumadin dose, repeat PTINR 3 weeks

## 2022-03-16 ENCOUNTER — Other Ambulatory Visit: Payer: Self-pay | Admitting: Oncology

## 2022-03-16 ENCOUNTER — Other Ambulatory Visit: Payer: Self-pay | Admitting: Family Medicine

## 2022-03-16 DIAGNOSIS — Z7901 Long term (current) use of anticoagulants: Secondary | ICD-10-CM

## 2022-03-16 DIAGNOSIS — R0982 Postnasal drip: Secondary | ICD-10-CM

## 2022-03-16 DIAGNOSIS — I82403 Acute embolism and thrombosis of unspecified deep veins of lower extremity, bilateral: Secondary | ICD-10-CM

## 2022-03-16 NOTE — Telephone Encounter (Signed)
Verified with pt that she is on 7.5 mg MWF and 10 mg all other days. refilled

## 2022-03-17 ENCOUNTER — Telehealth: Payer: Self-pay | Admitting: Pharmacist

## 2022-03-17 NOTE — Chronic Care Management (AMB) (Addendum)
Chronic Care Management Pharmacy Assistant   Name: Demika Langenderfer Kimball  MRN: [id number] DOB: Aug 28, 1955  Reason for Encounter: Medication Review / Medication Coordination Call   Recent office visits:  03/03/2022 Rolene Arbour LPN - Medicare annual wellness exam   Recent consult visits:  None  Hospital visits:  None  Medications: Outpatient Encounter Medications as of 03/17/2022  Medication Sig   ACCU-CHEK GUIDE test strip USE TO check blood glucose UP TO four times daily AS DIRECTED   Accu-Chek Softclix Lancets lancets USE TO check blood glucose UP TO four times daily AS DIRECTED Needs appointment for further refills   albuterol (PROVENTIL) (2.5 MG/3ML) 0.083% nebulizer solution USE 1 VIAL IN NEBULIZER EVERY 6 HOURS - and as needed   albuterol (VENTOLIN HFA) 108 (90 Base) MCG/ACT inhaler Inhale 2 puffs into the lungs every 4 (four) hours as needed for shortness of breath.   blood glucose meter kit and supplies KIT Dispense based on patient and insurance preference. Use up to four times daily as directed. (FOR ICD-9 250.00, 250.01).   budesonide-formoterol (SYMBICORT) 160-4.5 MCG/ACT inhaler Inhale 2 puffs into the lungs 2 (two) times daily.   cloNIDine (CATAPRES) 0.1 MG tablet TAKE ONE TABLET BY MOUTH BEFORE BREAKFAST and TAKE ONE TABLET BY MOUTH EVERYDAY AT BEDTIME   diclofenac sodium (VOLTAREN) 1 % GEL Apply 2 g topically daily as needed (fibromyalgia pain).   diltiazem (CARDIZEM CD) 240 MG 24 hr capsule TAKE ONE CAPSULE BY MOUTH BEFORE BREAKFAST   DULoxetine (CYMBALTA) 30 MG capsule Take 90 mg by mouth at bedtime.   FEROSUL 325 (65 Fe) MG tablet TAKE ONE TABLET BY MOUTH ONCE DAILY   fexofenadine (ALLEGRA) 180 MG tablet TAKE ONE TABLET BY MOUTH ONCE DAILY   fluticasone (FLONASE) 50 MCG/ACT nasal spray Place 2 sprays into both nostrils 2 (two) times daily.   furosemide (LASIX) 80 MG tablet TAKE ONE TABLET BY MOUTH TWICE DAILY   gabapentin (NEURONTIN) 300 MG capsule TAKE ONE  CAPSULE BY MOUTH TWICE DAILY   INVOKANA 100 MG TABS tablet TAKE ONE TABLET BY MOUTH BEFORE BREAKFAST   LINZESS 145 MCG CAPS capsule TAKE ONE CAPSULE BY MOUTH every other DAY   metFORMIN (GLUCOPHAGE) 500 MG tablet TAKE ONE TABLET BY MOUTH EVERYDAY AT BEDTIME   omeprazole (PRILOSEC) 20 MG capsule TAKE ONE CAPSULE BY MOUTH BEFORE BREAKFAST and TAKE ONE CAPSULE BY MOUTH EVERY EVENING   potassium chloride (MICRO-K) 10 MEQ CR capsule TAKE TWO CAPSULES BY MOUTH EVERYDAY AT BEDTIME   pravastatin (PRAVACHOL) 40 MG tablet TAKE ONE TABLET BY MOUTH EVERY EVENING   QUEtiapine (SEROQUEL) 50 MG tablet Take 50 mg by mouth 2 (two) times daily.   Semaglutide, 1 MG/DOSE, 4 MG/3ML SOPN Inject 1 mg as directed once a week.   spironolactone (ALDACTONE) 25 MG tablet TAKE 1/2 TABLET BY MOUTH ONCE DAILY   warfarin (COUMADIN) 10 MG tablet TAKE ONE TABLET BY MOUTH daily EXCEPT ON Monday, Wednesday, AND Friday OR AS DIRECTED   warfarin (COUMADIN) 7.5 MG tablet TAKE ONE TABLET BY MOUTH ON Monday, Wednesday, AND Fridays   zonisamide (ZONEGRAN) 100 MG capsule Take by mouth.   zonisamide (ZONEGRAN) 50 MG capsule 1 TABLET AM AND 2 TABLETS PM   Facility-Administered Encounter Medications as of 03/17/2022  Medication   sodium chloride flush (NS) 0.9 % injection 3 mL   Reviewed chart for medication changes ahead of medication coordination call.  No OVs, Consults, or hospital visits since last care coordination call/Pharmacist visit. (If appropriate,  list visit date, provider name)  No medication changes indicated OR if recent visit, treatment plan here.  BP Readings from Last 3 Encounters:  02/08/22 136/82  01/17/22 (!) 152/90  01/13/22 (!) 150/90    Lab Results  Component Value Date   HGBA1C 9.5 (H) 01/02/2022     Patient obtains medications through Adherence Packaging  30 Days    Last adherence delivery included: Zonisamide 100 mg  - take 1 capsule at breakfast and 2 capsules at dinner Gabapentin 300 mg - take  1 capsule with breakfast and dinner Symbicort 160/4.5 - use 2 puff twice daily Spironolactone 25 mg - take 1/2 tablet at breakfast Furosemide 80 mg - take 1 tablet at breakfast and dinner Pravastatin 40 mg  - take 1 tablet at dinner Cartia XT 240 mg - take 1 capsule before breakfast Invokana 100 mg - take 1 tablet before breakfast Potassium CL ER 85mq -  take 2 capsules at bedtime Warfarin 10 mg - take 1 tablet daily or as directed Metformin 500 mg - take 1 tablet at bedtime Fexofenadine 180 mg once daily at breakfast  Duloxetine 30 mg  - take 3 capsules at bedtime  Quetiapine 50 mg - take 1 tablet at breakfast and bedtime  Clonidine 0.1 mg - take 1 tablet before breakfast and bedtime Accu-chek test strips Accu-chek softclix lancets Albuterol HFA 2 puffs every 4 hours as needed Linzess 145 mcg - Take one capsule every other day Omeprazole - 20 mg - take 1 capsule before breakfast and dinner Warfarin 7.5 mg - take 1 tablet Mon-Wed-Fri or as directed   Patient declined (meds) last month:   Not in stock at [facility]:  Semaglutide 12m weekly, prescription forwarded to [facility] [address] to be filled until [facility] can get it back in stock.   Verified with [facility], it was received and in process.   Patient is due for next adherence delivery on: 03/28/2021   Called patient and reviewed medications and coordinated delivery.     This delivery to include: Zonisamide 100 mg  - take 1 capsule at breakfast and 2 capsules at dinner Gabapentin 300 mg - take 1 capsule with breakfast and dinner Symbicort 160/4.5 - use 2 puff twice daily Spironolactone 25 mg - take 1/2 tablet at breakfast Furosemide 80 mg - take 1 tablet at breakfast and dinner Pravastatin 40 mg  - take 1 tablet at dinner Cartia XT 240 mg - take 1 capsule before breakfast Invokana 100 mg - take 1 tablet before breakfast Potassium CL ER 1067m-  take 2 capsules at bedtime Warfarin 10 mg - take 1 tablet daily  or as directed Metformin 500 mg - take 1 tablet at bedtime Fexofenadine 180 mg once daily at breakfast  Duloxetine 30 mg  - take 3 capsules at bedtime  Quetiapine 50 mg - take 1 tablet at breakfast and bedtime  Clonidine 0.1 mg - take 1 tablet before breakfast and bedtime Accu-chek test strips Accu-chek softclix lancets Albuterol HFA 2 puffs every 4 hours as needed Linzess 145 mcg - Take one capsule every other day Omeprazole - 20 mg - take 1 capsule before breakfast and dinner Warfarin 7.5 mg - take 1 tablet Mon-Wed-Fri or as directed   Patient declined the following medications:  No medications declined   Confirmed delivery date of 03/28/2021, advised patient that pharmacy will contact them the morning of delivery.     Care Gaps: AWV - scheduled 03/07/2023 Last BP -  136/82 on 02/08/2022 Last A1C - 9.5 on 01/02/2022 Tdap - never done Mammogram - overdue Covid - postponed Shingrix - postponed Flu - postponed Pneumovax - postponed Hep C Screen - postponed   Star Rating Drugs: Invokana 100 mg - last filled 02/21/2022 30 DS at [facility] Metformin 500 mg - last filled 02/21/2022 30 DS at [facility] Pravastatin 40 mg - last filled 02/21/2022 30 DS at [facility] Semaglutide 90m/3ml - last filled 03/15/2022 28 [id number]

## 2022-03-21 ENCOUNTER — Other Ambulatory Visit: Payer: Self-pay | Admitting: Family Medicine

## 2022-03-21 ENCOUNTER — Inpatient Hospital Stay: Payer: 59

## 2022-03-22 ENCOUNTER — Encounter (HOSPITAL_BASED_OUTPATIENT_CLINIC_OR_DEPARTMENT_OTHER): Payer: Self-pay

## 2022-03-22 ENCOUNTER — Inpatient Hospital Stay: Payer: 59 | Attending: Oncology

## 2022-03-22 ENCOUNTER — Other Ambulatory Visit: Payer: Self-pay

## 2022-03-22 ENCOUNTER — Telehealth: Payer: Self-pay

## 2022-03-22 ENCOUNTER — Emergency Department (HOSPITAL_BASED_OUTPATIENT_CLINIC_OR_DEPARTMENT_OTHER)
Admission: EM | Admit: 2022-03-22 | Discharge: 2022-03-22 | Disposition: A | Discharge status: Home / Self Care | Payer: Medicare Other | Attending: Emergency Medicine | Admitting: Emergency Medicine

## 2022-03-22 ENCOUNTER — Emergency Department (HOSPITAL_BASED_OUTPATIENT_CLINIC_OR_DEPARTMENT_OTHER): Payer: Medicare Other | Admitting: Radiology

## 2022-03-22 DIAGNOSIS — M546 Pain in thoracic spine: Secondary | ICD-10-CM | POA: Insufficient documentation

## 2022-03-22 DIAGNOSIS — I82403 Acute embolism and thrombosis of unspecified deep veins of lower extremity, bilateral: Secondary | ICD-10-CM | POA: Diagnosis not present

## 2022-03-22 DIAGNOSIS — X500XXA Overexertion from strenuous movement or load, initial encounter: Secondary | ICD-10-CM | POA: Diagnosis not present

## 2022-03-22 DIAGNOSIS — J449 Chronic obstructive pulmonary disease, unspecified: Secondary | ICD-10-CM | POA: Diagnosis not present

## 2022-03-22 DIAGNOSIS — M549 Dorsalgia, unspecified: Secondary | ICD-10-CM

## 2022-03-22 DIAGNOSIS — M545 Low back pain, unspecified: Secondary | ICD-10-CM | POA: Diagnosis not present

## 2022-03-22 LAB — PROTIME-INR
INR: 2.8 — ABNORMAL HIGH (ref 0.8–1.2)
Prothrombin Time: 29.3 seconds — ABNORMAL HIGH (ref 11.4–15.2)

## 2022-03-22 MED ORDER — LIDOCAINE 5 % EX PTCH
1.0000 | MEDICATED_PATCH | Freq: Once | CUTANEOUS | Status: DC
  Administered 2022-03-22: 1 via TRANSDERMAL
  Filled 2022-03-22: qty 1

## 2022-03-22 MED ORDER — LIDOCAINE 5 % EX PTCH: 1.0000 | MEDICATED_PATCH | CUTANEOUS | 0 refills | Status: DC

## 2022-03-22 MED ORDER — METHOCARBAMOL 500 MG PO TABS: 500.0000 mg | ORAL_TABLET | Freq: Two times a day (BID) | ORAL | 0 refills | Status: DC

## 2022-03-22 MED ORDER — METHOCARBAMOL 500 MG PO TABS
500.0000 mg | ORAL_TABLET | Freq: Once | ORAL | Status: AC
  Administered 2022-03-22: 500 mg via ORAL
  Filled 2022-03-22: qty 1

## 2022-03-22 MED ORDER — ACETAMINOPHEN 500 MG PO TABS
1000.0000 mg | ORAL_TABLET | Freq: Once | ORAL | Status: AC
  Administered 2022-03-22: 1000 mg via ORAL
  Filled 2022-03-22: qty 2

## 2022-03-22 NOTE — ED Triage Notes (Signed)
She reports "lifting some heavy boxes Saturday" and she has c/o pain at right mid back ever since. The pain is made worse by some movements.

## 2022-03-22 NOTE — ED Notes (Signed)
Pt states she must have hurt her back by lifting heavy boxes over the weekend, increased pain with any movement

## 2022-03-22 NOTE — ED Provider Notes (Addendum)
[place] EMERGENCY DEPT Provider Note   CSN: [id number] Arrival date & time: 03/22/22  2595     History  Chief Complaint  Patient presents with   Back Pain    [name] is a 67 y.o. female who presents to the Emergency Department complaining of right mid back pain onset 5 days.  Her back pain does not radiate anywhere.  Notes that she lifted up a heavy box of food prior to the onset of her symptoms.  Denies other injury, trauma, fall.  Has tried Tylenol PM at home for symptoms.  Denies bowel/bladder incontinence, numbness, tingling, weakness, abdominal pain, nausea, vomiting, urinary symptoms, saddle paresthesia, gait problem.    The history is provided by the patient. No language interpreter was used.       Home Medications Prior to Admission medications   Medication Sig Start Date End Date Taking? Authorizing Provider  lidocaine (LIDODERM) 5 % Place 1 patch onto the skin daily. Remove & Discard patch within 12 hours or as directed by MD 03/22/22  Yes Vy Badley A, PA-C  methocarbamol (ROBAXIN) 500 MG tablet Take 1 tablet (500 mg total) by mouth 2 (two) times daily. 03/22/22  Yes Teka Chanda A, PA-C  ACCU-CHEK GUIDE test strip USE TO check blood glucose UP TO four times daily AS DIRECTED 01/09/22   Billie Ruddy, MD  Accu-Chek Softclix Lancets lancets USE TO check blood glucose UP TO four times daily AS DIRECTED Needs appointment for further refills 03/21/22   Billie Ruddy, MD  albuterol (PROVENTIL) (2.5 MG/3ML) 0.083% nebulizer solution USE 1 VIAL IN NEBULIZER EVERY 6 HOURS - and as needed 08/11/21   Billie Ruddy, MD  albuterol (VENTOLIN HFA) 108 (90 Base) MCG/ACT inhaler Inhale 2 puffs into the lungs every 4 (four) hours as needed for shortness of breath. 05/26/21   Billie Ruddy, MD  blood glucose meter kit and supplies KIT Dispense based on patient and insurance preference. Use up to four times daily as directed. (FOR ICD-9 250.00, 250.01).  07/16/19   Billie Ruddy, MD  budesonide-formoterol [facility]) 160-4.5 MCG/ACT inhaler Inhale 2 puffs into the lungs 2 (two) times daily. 04/18/21   Billie Ruddy, MD  cloNIDine (CATAPRES) 0.1 MG tablet TAKE ONE TABLET BY MOUTH BEFORE BREAKFAST and TAKE ONE TABLET BY MOUTH EVERYDAY AT BEDTIME 02/14/22   Billie Ruddy, MD  diclofenac sodium (VOLTAREN) 1 % GEL Apply 2 g topically daily as needed (fibromyalgia pain).    [provider]  diltiazem (CARDIZEM CD) 240 MG 24 hr capsule TAKE ONE CAPSULE BY MOUTH BEFORE BREAKFAST 11/15/21   Billie Ruddy, MD  DULoxetine (CYMBALTA) 30 MG capsule Take 90 mg by mouth at bedtime.    [provider]  FEROSUL 325 (65 Fe) MG tablet TAKE ONE TABLET BY MOUTH ONCE DAILY 07/19/21   Billie Ruddy, MD  fexofenadine (ALLEGRA) 180 MG tablet TAKE ONE TABLET BY MOUTH ONCE DAILY 03/17/22   Billie Ruddy, MD  fluticasone (FLONASE) 50 MCG/ACT nasal spray Place 2 sprays into both nostrils 2 (two) times daily.    [provider]  furosemide (LASIX) 80 MG tablet TAKE ONE TABLET BY MOUTH TWICE DAILY 02/14/22   [name] A, MD  gabapentin (NEURONTIN) 300 MG capsule TAKE ONE CAPSULE BY MOUTH TWICE DAILY 01/13/22   Billie Ruddy, MD  INVOKANA 100 MG TABS tablet TAKE ONE TABLET BY MOUTH BEFORE BREAKFAST 11/15/21   Werner Lean, MD  Rolan Lipa  145 MCG CAPS capsule TAKE ONE CAPSULE BY MOUTH every other DAY 02/14/22   Billie Ruddy, MD  metFORMIN (GLUCOPHAGE) 500 MG tablet TAKE ONE TABLET BY MOUTH EVERYDAY AT BEDTIME 11/15/21   Billie Ruddy, MD  omeprazole (PRILOSEC) 20 MG capsule TAKE ONE CAPSULE BY MOUTH BEFORE BREAKFAST and TAKE ONE CAPSULE BY MOUTH EVERY EVENING 12/16/21   Billie Ruddy, MD  potassium chloride (MICRO-K) 10 MEQ CR capsule TAKE TWO CAPSULES BY MOUTH EVERYDAY AT BEDTIME 01/13/22   Billie Ruddy, MD  pravastatin (PRAVACHOL) 40 MG tablet TAKE ONE TABLET BY MOUTH EVERY EVENING 02/14/22   Billie Ruddy, MD  QUEtiapine (SEROQUEL) 50 MG tablet Take 50 mg by mouth 2 (two) times daily.    [provider]  Semaglutide, 1 MG/DOSE, 4 MG/3ML SOPN Inject 1 mg as directed once a week. 02/08/22   Billie Ruddy, MD  spironolactone (ALDACTONE) 25 MG tablet TAKE 1/2 TABLET BY MOUTH ONCE DAILY 01/12/22   [name] A, MD  warfarin (COUMADIN) 10 MG tablet TAKE ONE TABLET BY MOUTH daily EXCEPT ON Monday, Wednesday, AND Friday OR AS DIRECTED 03/16/22   Owens Shark, NP  warfarin (COUMADIN) 7.5 MG tablet TAKE ONE TABLET BY MOUTH ON Monday, Wednesday, AND Fridays 11/18/21   Ladell Pier, MD  zonisamide (ZONEGRAN) 100 MG capsule Take by mouth. 11/24/21   [provider]  zonisamide (ZONEGRAN) 50 MG capsule 1 TABLET AM AND 2 TABLETS PM    [provider]      Allergies    Lisinopril and Corticosteroids    Review of Systems   Review of Systems  Musculoskeletal:  Positive for back pain.  All other systems reviewed and are negative.   Physical Exam Updated Vital Signs BP 127/84 (BP Location: Right Wrist)   Pulse 83   Temp (!) 97 F (36.1 C)   Resp (!) 24   SpO2 98%  Physical Exam Vitals and nursing note reviewed.  Constitutional:      General: She is not in acute distress.    Appearance: She is not diaphoretic.  HENT:     Head: Normocephalic and atraumatic.     Mouth/Throat:     Pharynx: No oropharyngeal exudate.  Eyes:     General: No scleral icterus.    Conjunctiva/sclera: Conjunctivae normal.  Cardiovascular:     Rate and Rhythm: Normal rate and regular rhythm.     Pulses: Normal pulses.     Heart sounds: Normal heart sounds.  Pulmonary:     Effort: Pulmonary effort is normal. No respiratory distress.     Breath sounds: Normal breath sounds. No wheezing.  Abdominal:     General: Bowel sounds are normal.     Palpations: Abdomen is soft. There is no mass.     Tenderness: There is no abdominal tenderness. There is no guarding or rebound.   Musculoskeletal:        General: Normal range of motion.     Cervical back: Normal range of motion and neck supple.     Comments: No spinal tenderness to palpation.  Tenderness to palpation noted to right thoracic musculature without overlying skin changes.  Able to ambulate without assistance or difficulty.  Strength and sensation intact to bilateral lower extremities. Negative SLR bilaterally.  Skin:    General: Skin is warm and dry.  Neurological:     Mental Status: She is alert.  Psychiatric:        Behavior: Behavior normal.  ED Results / Procedures / Treatments   Labs (all labs ordered are listed, but only abnormal results are displayed) Labs Reviewed - No data to display  EKG None  Radiology DG Thoracic Spine 2 View  Result Date: 03/22/2022 CLINICAL DATA:  Mid back pain after trying to lift abuts on Saturday. EXAM: THORACIC SPINE 3 VIEWS COMPARISON:  None Available. FINDINGS: Preserved vertebral body height and disc height. Diffuse bridging endplate osteophytes are seen scattered throughout the thoracic spine with anterior and lateral more on the right than left lateral sides. Preserved bone mineralization. No listhesis. Surgical clips in the right upper quadrant of the abdomen and position plate and screws along the lower cervical spine. IMPRESSION: Diffuse degenerative changes with bridging osteophytes Electronically Signed   By: Jill Side M.D.   On: 03/22/2022 10:25    Procedures Procedures    Medications Ordered in ED Medications  lidocaine (LIDODERM) 5 % 1 patch (1 patch Transdermal Patch Applied 03/22/22 1008)  methocarbamol (ROBAXIN) tablet 500 mg (500 mg Oral Given 03/22/22 1008)  acetaminophen (TYLENOL) tablet 1,000 mg (1,000 mg Oral Given 03/22/22 1007)    ED Course/ Medical Decision Making/ A&P Clinical Course as of 03/22/22 1039  Wed Mar 22, 2022  1009 Pt re-evaluated and noted improvement of symptoms with treatment regimen in the ED.  Discussed with  patient imaging findings.  Discussed with patient discharge treatment plan.  Answered all verbal questions.  Patient appears safe for discharge at this time. [SB]    Clinical Course User Index [SB] Miklos Bidinger A, PA-C                           Medical Decision Making Amount and/or Complexity of Data Reviewed Radiology: ordered.  Risk OTC drugs. Prescription drug management.   Patient with right mid back pain without radiation. No neurological deficits and normal neuro exam.  Patient is ambulatory without assistance or difficulty.  No loss of bowel or bladder control.  No urinary symptoms suggestive of a UTI.  Patient afebrile.  On exam patient with no spinal tenderness to palpation. Tenderness to palpation noted to right thoracic musculature without overlying skin changes.  Able to ambulate without assistance or difficulty.  Strength and sensation intact to bilateral lower extremities. No concerning findings on exams. Differential diagnosis includes but is not limited to fracture, herniation, muscle strain.   Imaging: I ordered imaging studies including thoracic xray I independently visualized and interpreted imaging which showed: Degenerative changes without acute findings I agree with the radiologist interpretation  Medications:  I ordered medication including tylenol, robaxin, lidoderm patch, warm compress for pain management Reevaluation of the patient after these medicines and interventions, I reevaluated the patient and found that they have improved I have reviewed the patients home medicines and have made adjustments as needed    Disposition: Presentation suspicious for likely muscle strain of the right thoracic region.  Doubt concerns at this time for fracture, herniation. After consideration of the diagnostic results and the patients response to treatment, I feel that the patient would benefit from Discharge home.  Patient sent with a prescription for Robaxin, Lidoderm  patch. Discussed importance of follow-up with primary care provider for management.  Supportive care measures and strict return precautions discussed with patient at bedside. Pt acknowledges and verbalizes understanding. Pt appears safe for discharge. Follow up as indicated in discharge paperwork.    This chart was dictated using voice recognition software, Dragon. Despite the best  efforts of this provider to proofread and correct errors, errors may still occur which can change documentation meaning.  Final Clinical Impression(s) / ED Diagnoses Final diagnoses:  Mid back pain on right side    Rx / DC Orders ED Discharge Orders          Ordered    methocarbamol (ROBAXIN) 500 MG tablet  2 times daily        03/22/22 1038    lidocaine (LIDODERM) 5 %  Every 24 hours        03/22/22 1038              Shanzay Hepworth A, PA-C 03/22/22 1039    Camiya Vinal A, PA-C 03/22/22 1407    Wyvonnia Dusky, MD 03/22/22 [phone]

## 2022-03-22 NOTE — Telephone Encounter (Signed)
-----   Message from Ladell Pier, MD sent at 03/22/2022  2:38 PM EST ----- Please call patient, continue Coumadin at current dose, repeat PT/INR in 1 month

## 2022-03-22 NOTE — Discharge Instructions (Addendum)
It was a pleasure taking care of you today!   Your x-ray was negative for fracture or dislocation at this time. It did show degenerative changes to your spine. If your back pain continues over the course of the next month, please follow up with your primary care provider or the ED for additional imaging. You are prescribed Robaxin and lidoderm patches.  Take medications as prescribed.  Do not operate any heavy machinery or drive while taking Robaxin as it can make you sleepy/drowsy.  You will also be sent a prescription for lidoderm patch, remove and replace with a new patch every 12 hours. You may apply heat to the affected area for up to 15 minutes at a time.  Ensure to place a barrier between your skin and the heat. Return to the Emergency Department if you are experiencing loss of bowel or bladder, increasing/worsening symptoms, fever, inability to walk.

## 2022-03-24 ENCOUNTER — Telehealth: Payer: Self-pay

## 2022-03-24 NOTE — Telephone Encounter (Signed)
     Patient  visit on 03/22/2022  at [facility] was for mid back pain right side.  Have you been able to follow up with your primary care physician? Patient has appointment on 03/27/2022.  The patient was or was not able to obtain any needed medicine or equipment. Yes  Are there diet recommendations that you are having difficulty following? No  Patient expresses understanding of discharge instructions and education provided has no other needs at this time.    [place] Resource Care Guide   [email or web address]  ?? [phone]   Website: [email or web address]  [email or web address]

## 2022-03-27 ENCOUNTER — Encounter: Payer: Self-pay | Admitting: Family Medicine

## 2022-03-27 ENCOUNTER — Ambulatory Visit (INDEPENDENT_AMBULATORY_CARE_PROVIDER_SITE_OTHER): Payer: 59 | Admitting: Family Medicine

## 2022-03-27 ENCOUNTER — Telehealth: Payer: Self-pay | Admitting: Pharmacist

## 2022-03-27 ENCOUNTER — Ambulatory Visit: Payer: Medicare Other | Admitting: Podiatry

## 2022-03-27 VITALS — BP 130/86 | HR 76 | Temp 98.6°F | Wt 286.8 lb

## 2022-03-27 DIAGNOSIS — M546 Pain in thoracic spine: Secondary | ICD-10-CM

## 2022-03-27 DIAGNOSIS — Z7901 Long term (current) use of anticoagulants: Secondary | ICD-10-CM | POA: Diagnosis not present

## 2022-03-27 DIAGNOSIS — E278 Other specified disorders of adrenal gland: Secondary | ICD-10-CM | POA: Diagnosis not present

## 2022-03-27 LAB — POCT URINALYSIS DIPSTICK
Bilirubin, UA: NEGATIVE
Blood, UA: NEGATIVE
Glucose, UA: NEGATIVE
Ketones, UA: NEGATIVE
Leukocytes, UA: NEGATIVE
Nitrite, UA: NEGATIVE
Protein, UA: POSITIVE — AB
Spec Grav, UA: 1.02 (ref 1.010–1.025)
Urobilinogen, UA: 0.2 E.U./dL
pH, UA: 6 (ref 5.0–8.0)

## 2022-03-27 MED ORDER — TRAMADOL HCL 50 MG PO TABS: 50.0000 mg | ORAL_TABLET | Freq: Two times a day (BID) | ORAL | 0 refills | Status: AC | PRN

## 2022-03-27 NOTE — Patient Instructions (Signed)
Your urine results in clinic did not appear to be consistent with urinary tract infection.   Symptoms more likely from muscle strain, though a kidney stone can cause similar pain. A limited supply of tramadol was sent to your pharmacy for your back pain. Monitor for any new symptoms including but not limited to fever, chills, nausea, vomiting. If symptoms have not improved in the next week or if pain begins to move notify clinic so a CT scan can be ordered to evaluate for further causes of symptoms.

## 2022-03-27 NOTE — Progress Notes (Signed)
Care Management & Coordination Services Pharmacy Team  Name: [name]  MRN: [id number] DOB: 27-Sep-1955  Reason for Encounter: Appointment Reminder  Contacted patient on 03/27/2022    Medications: Outpatient Encounter Medications as of 03/27/2022  Medication Sig   ACCU-CHEK GUIDE test strip USE TO check blood glucose UP TO four times daily AS DIRECTED   Accu-Chek Softclix Lancets lancets USE TO check blood glucose UP TO four times daily AS DIRECTED Needs appointment for further refills   albuterol (PROVENTIL) (2.5 MG/3ML) 0.083% nebulizer solution USE 1 VIAL IN NEBULIZER EVERY 6 HOURS - and as needed   albuterol (VENTOLIN HFA) 108 (90 Base) MCG/ACT inhaler Inhale 2 puffs into the lungs every 4 (four) hours as needed for shortness of breath.   blood glucose meter kit and supplies KIT Dispense based on patient and insurance preference. Use up to four times daily as directed. (FOR ICD-9 250.00, 250.01).   budesonide-formoterol (SYMBICORT) 160-4.5 MCG/ACT inhaler Inhale 2 puffs into the lungs 2 (two) times daily.   cloNIDine (CATAPRES) 0.1 MG tablet TAKE ONE TABLET BY MOUTH BEFORE BREAKFAST and TAKE ONE TABLET BY MOUTH EVERYDAY AT BEDTIME   diclofenac sodium (VOLTAREN) 1 % GEL Apply 2 g topically daily as needed (fibromyalgia pain).   diltiazem (CARDIZEM CD) 240 MG 24 hr capsule TAKE ONE CAPSULE BY MOUTH BEFORE BREAKFAST   DULoxetine (CYMBALTA) 30 MG capsule Take 90 mg by mouth at bedtime.   FEROSUL 325 (65 Fe) MG tablet TAKE ONE TABLET BY MOUTH ONCE DAILY   fexofenadine (ALLEGRA) 180 MG tablet TAKE ONE TABLET BY MOUTH ONCE DAILY   fluticasone (FLONASE) 50 MCG/ACT nasal spray Place 2 sprays into both nostrils 2 (two) times daily.   furosemide (LASIX) 80 MG tablet TAKE ONE TABLET BY MOUTH TWICE DAILY   gabapentin (NEURONTIN) 300 MG capsule TAKE ONE CAPSULE BY MOUTH TWICE DAILY   INVOKANA 100 MG TABS tablet TAKE ONE TABLET BY MOUTH BEFORE BREAKFAST   lidocaine (LIDODERM) 5 % Place 1 patch  onto the skin daily. Remove & Discard patch within 12 hours or as directed by MD   LINZESS 145 MCG CAPS capsule TAKE ONE CAPSULE BY MOUTH every other DAY   metFORMIN (GLUCOPHAGE) 500 MG tablet TAKE ONE TABLET BY MOUTH EVERYDAY AT BEDTIME   methocarbamol (ROBAXIN) 500 MG tablet Take 1 tablet (500 mg total) by mouth 2 (two) times daily.   omeprazole (PRILOSEC) 20 MG capsule TAKE ONE CAPSULE BY MOUTH BEFORE BREAKFAST and TAKE ONE CAPSULE BY MOUTH EVERY EVENING   potassium chloride (MICRO-K) 10 MEQ CR capsule TAKE TWO CAPSULES BY MOUTH EVERYDAY AT BEDTIME   pravastatin (PRAVACHOL) 40 MG tablet TAKE ONE TABLET BY MOUTH EVERY EVENING   QUEtiapine (SEROQUEL) 50 MG tablet Take 50 mg by mouth 2 (two) times daily.   Semaglutide, 1 MG/DOSE, 4 MG/3ML SOPN Inject 1 mg as directed once a week.   spironolactone (ALDACTONE) 25 MG tablet TAKE 1/2 TABLET BY MOUTH ONCE DAILY   warfarin (COUMADIN) 10 MG tablet TAKE ONE TABLET BY MOUTH daily EXCEPT ON Monday, Wednesday, AND Friday OR AS DIRECTED   warfarin (COUMADIN) 7.5 MG tablet TAKE ONE TABLET BY MOUTH ON Monday, Wednesday, AND Fridays   zonisamide (ZONEGRAN) 100 MG capsule Take by mouth.   zonisamide (ZONEGRAN) 50 MG capsule 1 TABLET AM AND 2 TABLETS PM   Facility-Administered Encounter Medications as of 03/27/2022  Medication   sodium chloride flush (NS) 0.9 % injection 3 mL   Lab Results  Component Value Date/Time  HGBA1C 9.5 (H) 01/02/2022 02:50 PM   HGBA1C 8.4 (H) 04/21/2021 10:42 AM   MICROALBUR 40.3 (H) 01/02/2022 02:50 PM   MICROALBUR 12.5 (H) 08/11/2020 11:16 AM    BP Readings from Last 3 Encounters:  03/22/22 127/84  02/08/22 136/82  01/17/22 (!) 152/90    Patient contacted to confirm telephone appointment with [name] PharmD, on 03/28/2022 at 10:00.   Care Gaps: AWV - scheduled 03/07/2023 Last BP - 127/84 on 03/22/2022 Last A1C - 9.5 on 01/02/2022 Tdap - never done Mammogram - overdue Covid - overdue Shingrix - postponed Flu  - postponed Pneumovax - postponed Hep C Screen - postponed  Star Rating Drugs: Invokana 100 mg - last filled 02/21/2022 30 DS at [facility] Metformin 500 mg - last filled 02/21/2022 30 [facility] at [facility] Pravastatin 40 mg - last filled 02/21/2022 30 [facility] at [facility] Semaglutide '2mg'$ /95m - last filled 03/15/2022 28 [phone]

## 2022-03-27 NOTE — Progress Notes (Signed)
Subjective:    Patient ID: [name], female    DOB: 04-09-1955, 67 y.o.   MRN: [id number]  Chief Complaint  Patient presents with   Follow-up    Pt reports is following up from ER visit for back pain on 1/3. tylenol and muscle relaxer is not working. Pain is worsen when urinates.    Back Pain    HPI Patient is a 67-year-old female with pmh sig for HTN, obesity, DM 2, COPD, on chronic anticoagulation 2/2 DVT, HLD, arthritis who was seen today for follow-up.  Patient seen in ED on 04/08/2022 for mid back pain on right side.  Symptoms started 1 week before New Year's Eve.  Patient states she lifted a heavy box of food and had back pain the next day.  X-ray thoracic spine with diffuse degenerative changes with bridging osteophytes.  Patient with continued right-sided lateral mid back pain.  Pain becoming worse,.  Has difficulty describing states "it just hurts".  Medication given in ED has not been helpful.  Patient taking 6 Tylenol daily.  Endorses increased symptoms with urination and trying to wipe denies dysuria, emesis, fever, chills.  Had nausea 2 days ago.  Last BM after taking Linzess and stool softener.  Tried pain patch, heat, muscle relaxer without relief.    Past Medical History:  Diagnosis Date   Adrenal mass ([place]) 03/226   Benign   Arthritis    knees/multiple orthopedic conditons; lower back   Asthma    per pt   Clotting disorder (HCC)    +beta-2-glycoprotein IgA antibody   COPD (chronic obstructive pulmonary disease) (HCC)    inhalers dependent on environment   Depression    Diabetes mellitus    120s usually fasting -  dx more than 10 yrs ago   Dizziness, nonspecific    DVT (deep venous thrombosis) (HCC)    Recurrent   Fibromyalgia    GERD (gastroesophageal reflux disease)    Heart murmur    History of blood transfusion    History of cocaine abuse ([place])    Remote history    Hyperlipidemia    Hypertension    takes meds daily   Hypothyroidism    Lupus  Anticoagulant Positive    On home oxygen therapy    at night   Shortness of breath    exertion or lying flat   Sleep apnea    2l of oxygen at night (as of 12/6, she used to)    Allergies  Allergen Reactions   Lisinopril Swelling    THROAT SWELLING Pt states her throat started to "close up" after being on it for "so long."   Corticosteroids Rash    States she developed a rash on her tongue after a steroid injection in her bac    ROS General: Denies fever, chills, night sweats, changes in weight, changes in appetite HEENT: Denies headaches, ear pain, changes in vision, rhinorrhea, sore throat CV: Denies CP, palpitations, SOB, orthopnea Pulm: Denies SOB, cough, wheezing GI: Denies abdominal pain, nausea, vomiting, diarrhea, constipation GU: Denies dysuria, hematuria, frequency, vaginal discharge Msk: Denies muscle cramps, joint pains  + back pain Neuro: Denies weakness, numbness, tingling Skin: Denies rashes, bruising Psych: Denies depression, anxiety, hallucinations    Objective:    Blood pressure 130/86, pulse 76, temperature 98.6 F (37 C), temperature source Oral, weight 286 lb 12.8 oz (130.1 kg), SpO2 95 %. Body mass index is 46.29 kg/m.  Gen. Pleasant, well-nourished, in no distress, normal affect.  Appears  mildly to moderately uncomfortable sitting in chair. HEENT: [place]/AT, face symmetric, conjunctiva clear, no scleral icterus, PERRLA, EOMI, nares patent without drainage Lungs: no accessory muscle use, CTAB, no wheezes or rales Cardiovascular: RRR, no m/r/g, no peripheral edema Abdomen: BS present, soft, NT/ND.  No left-sided CVA tenderness, Discomfort on right side. Musculoskeletal: Able to get on exam table. no TTP of cervical, thoracic, lumbar spine.  TTP of right mid back lateral to thoracic spine.  No deformities, no cyanosis or clubbing, normal tone. Neuro:  A&Ox3, CN II-XII intact, normal gait Skin:  Warm, no lesions/ rash   Wt Readings from Last 3 Encounters:   03/27/22 286 lb 12.8 oz (130.1 kg)  03/03/22 287 lb (130.2 kg)  02/08/22 287 lb 12.8 oz (130.5 kg)    Lab Results  Component Value Date   WBC 5.7 04/21/2021   HGB 12.7 04/21/2021   HCT 39.4 04/21/2021   PLT 316.0 04/21/2021   GLUCOSE 205 (H) 01/02/2022   CHOL 194 02/02/2020   TRIG 196 (H) 02/02/2020   HDL 76 02/02/2020   LDLCALC 89 02/02/2020   ALT 19 01/02/2022   AST 16 01/02/2022   NA 139 01/02/2022   K 3.6 01/02/2022   CL 102 01/02/2022   CREATININE 0.73 01/02/2022   BUN 12 01/02/2022   CO2 25 01/02/2022   TSH 5.08 01/02/2022   INR 2.8 (H) 03/22/2022   HGBA1C 9.5 (H) 01/02/2022   MICROALBUR 40.3 (H) 01/02/2022    Assessment/Plan:  Acute right-sided thoracic back pain  - Plan: POC Urinalysis Dipstick, Urinalysis with Reflex Microscopic, traMADol (ULTRAM) 50 MG tablet  Morbid obesity (HCC) -Body mass index is 46.29 kg/m.  Chronic anticoagulation -h/o DVT -continue warfarin  Adrenal mass 1 cm to 4 cm in diameter with no history of malignant neoplasm (HCC) -h/o history of adrenal mass noted on CT abdomen pelvis with contrast from 09/01/2016.  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  -continue to monitor.  Discussed possible causes of symptoms including muscle strain, pyelonephritis, renal calculi.  POC UA negative.  Thoracic x-ray reviewed from ED and negative for acute process.  The patient urinary symptoms will obtain UA with micro.  If blood noted consider imaging for renal calculi.  Will give limited supply of tramadol for chronic pain.  Can alternate with Tylenol and continue other supportive care including heat.  For continued symptoms despite conservative management obtain CT   On day of service, 35 minutes spent caring for this patient face-to-face, reviewing notes, labs, imaging and chart, counseling and/or coordinating care for plan and treatment of diagnosis below.    F/u orn  [name], MD

## 2022-03-28 ENCOUNTER — Ambulatory Visit: Payer: Medicare Other | Admitting: Pharmacist

## 2022-03-28 ENCOUNTER — Telehealth: Payer: Medicare Other

## 2022-03-28 ENCOUNTER — Encounter: Payer: Medicare Other | Admitting: Pharmacist

## 2022-03-28 DIAGNOSIS — I10 Essential (primary) hypertension: Secondary | ICD-10-CM

## 2022-03-28 DIAGNOSIS — E119 Type 2 diabetes mellitus without complications: Secondary | ICD-10-CM

## 2022-03-28 LAB — URINALYSIS, ROUTINE W REFLEX MICROSCOPIC
Bilirubin Urine: NEGATIVE
Hgb urine dipstick: NEGATIVE
Ketones, ur: NEGATIVE
Leukocytes,Ua: NEGATIVE
Nitrite: NEGATIVE
RBC / HPF: NONE SEEN (ref 0–?)
Specific Gravity, Urine: 1.025 (ref 1.000–1.030)
Total Protein, Urine: 100 — AB
Urine Glucose: NEGATIVE
Urobilinogen, UA: 0.2 (ref 0.0–1.0)
pH: 6.5 (ref 5.0–8.0)

## 2022-03-28 NOTE — Patient Instructions (Signed)
Hi Ithzel,  It was great to catch up again! Please start back on checking your blood sugars and blood pressure at home.  Please reach out to me if you have any questions or need anything before our follow up!  Best, Maddie  [name], PharmD, [place] Pharmacist [facility] [place]

## 2022-03-28 NOTE — Progress Notes (Signed)
Care Management & Coordination Services Pharmacy Note  03/28/2022 Name:  [name] MRN:  [id number] DOB:  Dec 02, 1955  Summary: A1c not at [place] < 7% BP not at [place] < 130/80 LDL not at [place] < 70   Recommendations/Changes made from today's visit: -Recommended restarting blood sugar monitoring at home once daily -Recommended bringing BP cuff to next office visit to ensure accuracy -Recommended 3-4 times a week BP monitoring at home -Recommend repeat lipid panel   Follow up plan: Follow up with PCP for repeat A1c in 3 weeks DM & BP assessment in 4-5 weeks Follow up in 3 months  Subjective: [name] is an 67 y.o. year old female who is a primary patient of Volanda Napoleon, Langley Adie, MD.  The care coordination team was consulted for assistance with disease management and care coordination needs.    Engaged with patient by telephone for follow up visit.  Patient Care Team: Billie Ruddy, MD as PCP - General (Family Medicine) Werner Lean, MD as PCP - Cardiology (Cardiology) Viona Gilmore, [facility] as Pharmacist (Pharmacist)  Recent office visits: 03/27/21 [name], MD: Patient presented for back pain post ER visit.   03/03/2022 Rolene Arbour LPN - Medicare annual wellness exam.  02/08/2022 [name] MD - Patient was seen for Type 2 diabetes mellitus with diabetic neuropathy, without long-term current use of insulin  and additional concerns. Increased Semaglutide to '1mg'$  weekly. Follow up in 2 months.    01/02/2022 [name] MD - Patient was seen for Type 2 diabetes mellitus with diabetic neuropathy, without long-term current use of insulin and additional concerns. Started Semaglutide Inject 0.25 mg subcu weekly x4 weeks. On week 5 increase dose to 0.5 mg weekly. Follow up in 5 weeks.   12/07/2021 [name] MD - Patient was seen for Abnormal sensation in right ear and additional concerns. Discontinued Magic mouthwash, Meclizine and Reglan. Follow  up in 2 months.   Recent consult visits: 01/13/2022 [name] MD (oncology) - Patient was seen for chronic anticoagulation. No medication changes. Follow up in 6 months.   12/06/2021 Acquanetta Sit DPM - Patient was seen for Pain due to onychomycosis of toenails of both feet and additional concerns. No medication changes. Follow up in 3 months.    10/13/2021 Ned Card NP (hem-onc) - Patient was seen for  Chronic anticoagulation and an additional issue. No medication changes. Follow up in 3 months.  10/07/2021 [name] MD (cardiology) - Patient was seen for Diabetes mellitus with coincident hypertension and additional issues. Discontinued Vit D. Follow up in 1 year.  Hospital visits: 03/22/22 Patient presented to [facility] ED for back pain on right side. Prescribed methocarbamol and lidocaine patches.   Objective:  Lab Results  Component Value Date   CREATININE 0.73 01/02/2022   BUN 12 01/02/2022   GFR 85.60 01/02/2022   EGFR 91 02/21/2021   GFRNONAA >60 02/18/2021   GFRAA 94 03/05/2020   NA 139 01/02/2022   K 3.6 01/02/2022   CALCIUM 8.9 01/02/2022   CO2 25 01/02/2022   GLUCOSE 205 (H) 01/02/2022    Lab Results  Component Value Date/Time   HGBA1C 9.5 (H) 01/02/2022 02:50 PM   HGBA1C 8.4 (H) 04/21/2021 10:42 AM   GFR 85.60 01/02/2022 02:50 PM   GFR 69.69 04/21/2021 10:42 AM   MICROALBUR 40.3 (H) 01/02/2022 02:50 PM   MICROALBUR 12.5 (H) 08/11/2020 11:16 AM    Last diabetic Eye exam:  Lab Results  Component  Value Date/Time   HMDIABEYEEXA No Retinopathy 02/07/2022 12:00 AM    Last diabetic Foot exam: No results found for: "HMDIABFOOTEX"   Lab Results  Component Value Date   CHOL 194 02/02/2020   HDL 76 02/02/2020   LDLCALC 89 02/02/2020   TRIG 196 (H) 02/02/2020   CHOLHDL 2.6 02/02/2020       Latest Ref Rng & Units 01/02/2022    2:50 PM 04/21/2021   10:42 AM 02/02/2021    2:29 PM  Hepatic Function  Total Protein 6.0 - 8.3 g/dL  7.8  8.5  8.1   Albumin 3.5 - 5.2 g/dL 4.1  4.3  4.3   AST 0 - 37 U/L '16  19  23   '$ ALT 0 - 35 U/L '19  20  21   '$ Alk Phosphatase 39 - 117 U/L 79  80  84   Total Bilirubin 0.2 - 1.2 mg/dL 0.2  0.3  0.4     Lab Results  Component Value Date/Time   TSH 5.08 01/02/2022 02:50 PM   TSH 5.15 04/21/2021 10:42 AM   FREET4 0.67 01/02/2022 02:50 PM   FREET4 0.62 04/21/2021 10:42 AM       Latest Ref Rng & Units 04/21/2021   10:42 AM 02/28/2021    8:00 AM 02/21/2021    3:32 PM  CBC  WBC 4.0 - 10.5 K/uL 5.7   7.2   Hemoglobin 12.0 - 15.0 g/dL 12.7  12.6  12.0   Hematocrit 36.0 - 46.0 % 39.4  37.0  36.6   Platelets 150.0 - 400.0 K/uL 316.0   323     Lab Results  Component Value Date/Time   VD25OH 31.30 04/21/2021 10:42 AM   VD25OH 18.14 (L) 02/02/2021 02:29 PM   VITAMINB12 915 (H) 04/21/2021 10:42 AM   VITAMINB12 373 02/02/2021 02:29 PM    Clinical ASCVD: No  The 10-year ASCVD risk score (Arnett DK, et al., 2019) is: 20.7%   Values used to calculate the score:     Age: 67 years     Sex: Female     Is Non-Hispanic African American: Yes     Diabetic: Yes     Tobacco smoker: No     Systolic Blood Pressure: 865 mmHg     Is BP treated: Yes     HDL Cholesterol: 76 mg/dL     Total Cholesterol: 194 mg/dL       03/27/2022    2:52 PM 03/03/2022    9:58 AM 02/08/2022   10:42 AM  Depression screen PHQ 2/9  Decreased Interest '2 1 1  '$ Down, Depressed, Hopeless 0 0 0  PHQ - 2 Score '2 1 1  '$ Altered sleeping 0 0 0  Tired, decreased energy 1 0 1  Change in appetite 2 0 1  Feeling bad or failure about yourself  0 0 0  Trouble concentrating 1 0 1  Moving slowly or fidgety/restless 0 0 0  Suicidal thoughts 0 0 0  PHQ-9 Score '6 1 4  '$ Difficult doing work/chores Somewhat difficult Not difficult at all Somewhat difficult     Social History   Tobacco Use  Smoking Status Never  Smokeless Tobacco Never   BP Readings from Last 3 Encounters:  03/27/22 130/86  03/22/22 127/84  02/08/22 136/82    Pulse Readings from Last 3 Encounters:  03/27/22 76  03/22/22 83  02/08/22 92   Wt Readings from Last 3 Encounters:  03/27/22 286 lb 12.8 oz (130.1 kg)  03/03/22 287  lb (130.2 kg)  02/08/22 287 lb 12.8 oz (130.5 kg)   BMI Readings from Last 3 Encounters:  03/27/22 46.29 kg/m  03/03/22 46.32 kg/m  02/08/22 46.45 kg/m    Allergies  Allergen Reactions   Lisinopril Swelling    THROAT SWELLING Pt states her throat started to "close up" after being on it for "so long."   Corticosteroids Rash    States she developed a rash on her tongue after a steroid injection in her bac    Medications Reviewed Today     Reviewed by Billie Ruddy, MD (Physician) on 03/27/22 at 1523  Med List Status: <None>   Medication Order Taking? Sig Documenting Provider Last Dose Status Informant  ACCU-CHEK GUIDE test strip [id number] Yes USE TO check blood glucose UP TO four times daily AS DIRECTED Billie Ruddy, MD Taking Active   Accu-Chek Softclix Lancets lancets [id number] Yes USE TO check blood glucose UP TO four times daily AS DIRECTED Needs appointment for further refills Billie Ruddy, MD Taking Active   albuterol (PROVENTIL) (2.5 MG/3ML) 0.083% nebulizer solution [id number] Yes USE 1 VIAL IN NEBULIZER EVERY 6 HOURS - and as needed Billie Ruddy, MD Taking Active   albuterol (VENTOLIN HFA) 108 (90 Base) MCG/ACT inhaler [id number] Yes Inhale 2 puffs into the lungs every 4 (four) hours as needed for shortness of breath. Billie Ruddy, MD Taking Active   blood glucose meter kit and supplies KIT [id number] Yes Dispense based on patient and insurance preference. Use up to four times daily as directed. (FOR ICD-9 250.00, 250.01). Billie Ruddy, MD Taking Active   budesonide-formoterol [facility]) 160-4.5 MCG/ACT inhaler [id number] Yes Inhale 2 puffs into the lungs 2 (two) times daily. Billie Ruddy, MD Taking Active   cloNIDine (CATAPRES) 0.1 MG tablet [id number] Yes TAKE ONE TABLET BY MOUTH  BEFORE BREAKFAST and TAKE ONE TABLET BY MOUTH EVERYDAY AT BEDTIME Billie Ruddy, MD Taking Active   diclofenac sodium (VOLTAREN) 1 % GEL [id number] Yes Apply 2 g topically daily as needed (fibromyalgia pain). [provider] Taking Active Child  diltiazem (CARDIZEM CD) 240 MG 24 hr capsule [id number] Yes TAKE ONE CAPSULE BY MOUTH BEFORE BREAKFAST Billie Ruddy, MD Taking Active   DULoxetine (CYMBALTA) 30 MG capsule [id number] Yes Take 90 mg by mouth at bedtime. [provider] Taking Active   FEROSUL 325 (65 Fe) MG tablet [id number] Yes TAKE ONE TABLET BY MOUTH ONCE DAILY Billie Ruddy, MD Taking Active   fexofenadine (ALLEGRA) 180 MG tablet [id number] Yes TAKE ONE TABLET BY MOUTH ONCE DAILY Billie Ruddy, MD Taking Active   fluticasone (FLONASE) 50 MCG/ACT nasal spray [id number] Yes Place 2 sprays into both nostrils 2 (two) times daily. [provider] Taking Active   furosemide (LASIX) 80 MG tablet [id number] Yes TAKE ONE TABLET BY MOUTH TWICE DAILY Chandrasekhar, [name] A, MD Taking Active   gabapentin (NEURONTIN) 300 MG capsule [id number] Yes TAKE ONE CAPSULE BY MOUTH TWICE DAILY Billie Ruddy, MD Taking Active   INVOKANA 100 MG TABS tablet [id number] Yes TAKE ONE TABLET BY MOUTH BEFORE BREAKFAST Chandrasekhar, [name] A, MD Taking Active   lidocaine (LIDODERM) 5 % [id number] Yes Place 1 patch onto the skin daily. Remove & Discard patch within 12 hours or as directed by MD Blue, Soijett A, PA-C Taking Active   LINZESS 145 MCG CAPS capsule [id number] Yes TAKE ONE CAPSULE BY MOUTH every other DAY Billie Ruddy, MD Taking Active   metFORMIN (GLUCOPHAGE)  500 MG tablet [id number] Yes TAKE ONE TABLET BY MOUTH EVERYDAY AT BEDTIME Billie Ruddy, MD Taking Active   methocarbamol (ROBAXIN) 500 MG tablet [id number] Yes Take 1 tablet (500 mg total) by mouth 2 (two) times daily. Blue, Soijett A, PA-C Taking Active   omeprazole (PRILOSEC) 20 MG capsule [id number] Yes TAKE ONE  CAPSULE BY MOUTH BEFORE BREAKFAST and TAKE ONE CAPSULE BY MOUTH EVERY EVENING Billie Ruddy, MD Taking Active   potassium chloride (MICRO-K) 10 MEQ CR capsule [id number] Yes TAKE TWO CAPSULES BY MOUTH EVERYDAY AT BEDTIME Billie Ruddy, MD Taking Active   pravastatin (PRAVACHOL) 40 MG tablet [id number] Yes TAKE ONE TABLET BY MOUTH EVERY EVENING Billie Ruddy, MD Taking Active   QUEtiapine (SEROQUEL) 50 MG tablet [id number] Yes Take 50 mg by mouth 2 (two) times daily. [provider] Taking Active   Semaglutide, 1 MG/DOSE, 4 MG/3ML SOPN [id number] Yes Inject 1 mg as directed once a week. Billie Ruddy, MD Taking Active   sodium chloride flush (NS) 0.9 % injection 3 mL [id number]   [name] A, MD  Active   spironolactone (ALDACTONE) 25 MG tablet [id number] Yes TAKE 1/2 TABLET BY MOUTH ONCE DAILY Chandrasekhar, [name] A, MD Taking Active   traMADol (ULTRAM) 50 MG tablet [id number] Yes Take 1 tablet (50 mg total) by mouth every 12 (twelve) hours as needed for up to 5 days. Billie Ruddy, MD  Active   warfarin (COUMADIN) 10 MG tablet [id number] Yes TAKE ONE TABLET BY MOUTH daily EXCEPT ON Monday, Wednesday, AND Friday OR AS DIRECTED Owens Shark, NP Taking Active   warfarin (COUMADIN) 7.5 MG tablet [id number] Yes TAKE ONE TABLET BY MOUTH ON Monday, Wednesday, AND Fridays Ladell Pier, MD Taking Active   zonisamide (ZONEGRAN) 100 MG capsule [id number] Yes Take by mouth. [provider] Taking Active   zonisamide (ZONEGRAN) 50 MG capsule [id number] Yes 1 TABLET AM AND 2 TABLETS PM [provider] Taking Active             Patient Active Problem List   Diagnosis Date Noted   Mixed hyperlipidemia 10/07/2021   Paroxysmal SVT (supraventricular tachycardia) 10/07/2021   Allergic rhinitis 11/29/2020   Allergic rhinitis due to pollen 11/29/2020   Moderate persistent asthma without complication 18/56/3149   Osteoarthritis of spine with radiculopathy, lumbar  region 08/24/2020   Chronic back pain 08/18/2020   Chronic heart failure with preserved ejection fraction ([place]) 02/27/2020   Obesity, diabetes, and hypertension syndrome ([place]) 02/27/2020   Vitreomacular adhesion of both eyes 02/09/2020   Pseudophakia of both eyes 02/09/2020   Chronic diastolic CHF (congestive heart failure) ([place]) 03/12/2019   Hypernatremia 03/09/2019   Suspected COVID-19 virus infection 03/09/2019   Acute encephalopathy 03/09/2019   Right sided weakness 02/20/2019   Thyroid mass 02/27/2017   Chronic pain of right knee 05/24/2016   Effusion, right knee 04/28/2016   Presence of right artificial knee joint 04/28/2016   Abnormal LFTs    Decreased sensation    Paresthesia 06/12/2014   Obstruction of kidney 04/24/2014   Diabetes type 2, uncontrolled 04/24/2014   Chronic anticoagulation 04/24/2014   Crack cocaine use 04/24/2014   Depression 04/24/2014   Fibromyalgia 04/24/2014   OSA (obstructive sleep apnea) 04/24/2014   Lupus anticoagulant positive 04/24/2014   Adrenal mass, right ([place]) 04/24/2014   Right kidney mass 04/24/2014   Hydronephrosis of right kidney 04/24/2014   Supratherapeutic INR 04/24/2014   Renal mass, right 04/24/2014   Diabetes mellitus  with coincident hypertension ([place])    Right lower quadrant abdominal pain    Morbid obesity ([place]) 08/21/2013   Constipation 08/19/2013   Lumbosacral spondylosis without myelopathy 11/19/2012   Bilateral deep vein thromboses ([place]) 12/13/2011   Abdominal pain 11/05/2011   Hypokalemia 11/05/2011   Sciatica 11/05/2011   Diabetes mellitus type 2 in obese ([place]) 11/05/2011   HTN (hypertension) 11/05/2011    Immunization History  Administered Date(s) Administered   Influenza Split 01/08/2012   Influenza,inj,Quad PF,6+ Mos 12/31/2018   Influenza-Unspecified 12/18/2020   PFIZER Comirnaty(Gray Top)Covid-19 Tri-Sucrose Vaccine 11/12/2020   PFIZER(Purple Top)SARS-COV-2 Vaccination 06/30/2019, 07/21/2019   Pfizer  Covid-19 Vaccine Bivalent Booster 23yr & up 02/14/2021   Pneumococcal Polysaccharide-23 01/08/2012     Compliance/Adherence/Medication fill history: Care Gaps: Tetanus, mammogram, COVID booster, shingrix, influenza, pneumovax, Hep C screening, influenza Last BP - 127/84 on 03/22/2022 Last A1C - 9.5 on 01/02/2022  Star-Rating Drugs: Invokana 100 mg - last filled 02/21/2022 30 DS at [facility] Metformin 500 mg - last filled 02/21/2022 30 DS at [facility] Pravastatin 40 mg - last filled 02/21/2022 30 DS at [facility] Semaglutide '2mg'$ /391m- last filled 03/15/2022 28 DS at [facility]  SDOH:  (Social Determinants of Health) assessments and interventions performed: Yes (last 12/21/21) SDOH Interventions    Flowsheet Row Clinical Support from 03/03/2022 in [place] [facility] [facility] from 12/21/2021 in [place] [facility] 03/02/2021 in [facility] from 10/07/2020 in [place] [facility] 03/05/2020 in [facility] Intervention Not Indicated -- -- -- Intervention Not Indicated  Housing Interventions Intervention Not Indicated -- -- -- Intervention Not Indicated  Transportation Interventions Intervention Not Indicated -- -- Intervention Not Indicated Intervention Not Indicated  Utilities Interventions Intervention Not Indicated -- -- -- --  Alcohol Usage Interventions Intervention Not Indicated (Score <7) -- -- -- --  Depression Interventions/Treatment  -- -- Currently on Treatment -- PHQ2-9 Score <4 Follow-up Not Indicated  Financial Strain Interventions Intervention Not Indicated Intervention Not Indicated -- Intervention Not Indicated Intervention Not Indicated  Physical Activity Interventions Intervention Not Indicated -- -- -- Intervention Not Indicated  Stress Interventions Intervention Not  Indicated -- -- -- Intervention Not Indicated  Social Connections Interventions Intervention Not Indicated -- -- -- Intervention Not Indicated      SDOH Screenings   Food Insecurity: No Food Insecurity (03/03/2022)  Housing: Low Risk  (03/03/2022)  Transportation Needs: No Transportation Needs (03/03/2022)  Utilities: Not At Risk (03/03/2022)  Alcohol Screen: Low Risk  (03/03/2022)  Depression (PHQ2-9): Medium Risk (03/27/2022)  Financial Resource Strain: Low Risk  (03/03/2022)  Physical Activity: Insufficiently Active (03/03/2022)  Social Connections: Moderately Integrated (03/03/2022)  Stress: No Stress Concern Present (03/03/2022)  Tobacco Use: Low Risk  (03/27/2022)    Medication Assistance: None required.  Patient affirms current coverage meets needs.  Medication Access: Within the past 30 days, how often has patient missed a dose of medication? once Is a pillbox or other method used to improve adherence? Yes  Factors that may affect medication adherence? lack of understanding of disease management Are meds synced by current pharmacy? Yes  Are meds delivered by current pharmacy? Yes  Does patient experience delays in picking up medications due to transportation concerns? No   [facility] Reviewed: Is patient disadvantaged to use [facility]?: No  [facility]  [facility]:  [facility] - [place], [place] - [address] [place] [address] Phone: [phone] Fax: [phone]  [facility] services reviewed with patient today?: Yes  Patient requests to transfer care to [facility]?: No  Reason patient declined to change pharmacies: Patient is already actively enrolled with [facility]  Patient reports her back pain is the biggest issue for her right now. Patient reports she doesn't feel like the Tylenol is helping much and will start on the  tramadol today once she gets it from the pharmacy. She will call the office back if the pain worsens or if she does not get any relief from the tramadol.  Patient has not been checking her blood sugars or blood pressure since the pain has been going on. She reports prior to the pain, her most recent blood sugar reading was 135.   Assessment/Plan  Patient Care Plan: [facility] Pharmacy Assessment/Plan     Problem Identified: Problem: Hypertension, Hyperlipidemia, Diabetes, Heart Failure, COPD, and Depression      Long-Range [place]: Patient-Specific [place]   Start Date: 10/07/2020  Expected End Date: 10/07/2021  Recent Progress: On track  Priority: High  Note:   Hypertension (BP [place] <130/80) -Uncontrolled -Current treatment: Clonidine 0.1 mg 1 tablet twice daily - Appropriate, Query effective, Safe, Accessible Spironolactone 25 mg 1/2 tablet daily - Appropriate, Query effective, Safe, Accessible Cartia XT 240 mg 1 capsule daily - PM - Appropriate, Query effective, Safe, Accessible -Medications previously tried: amlodipine (taking diltiazem), lisinopril (throat swelling) -Current home readings: 157/94 in office (owns an arm cuff now - it was 5/4 mmHg off from office cuff) -Current dietary habits: uses black pepper; no salt -Current exercise habits: not consistently -Denies hypotensive/hypertensive symptoms -Educated on BP goals and benefits of medications for prevention of heart attack, stroke and kidney damage; Exercise [place] of 150 minutes per week; Importance of home blood pressure monitoring; Proper BP monitoring technique; -Counseled to monitor BP at home twice weekly, document, and provide log at future appointments -Counseled on diet and exercise extensively Recommended to continue current medication -Recommended monitoring BP 3-4 times a week  Hyperlipidemia: (LDL [place] < 70) -Uncontrolled -Current treatment: Pravastatin 40 mg 1 tablet every evening - Appropriate, Query effective,  Safe, Accessible  -Medications previously tried: none  -Current dietary patterns: chicken or pork chop for meat -Current exercise habits: not consistently; some PT exercises -Educated on Cholesterol goals;  Benefits of statin for ASCVD risk reduction; Importance of limiting foods high in cholesterol; Exercise [place] of 150 minutes per week; -Counseled on diet and exercise extensively Recommended increasing pravastatin to target [place] LDL and repeat lipid panel.  Diabetes (A1c [place] <7%) -Uncontrolled -Current medications: Invokana 100 mg 1 tablet daily - Appropriate, Query effective, Safe, Accessible Metformin 500 mg 1 tablet at bedtime - Appropriate, Query effective, Safe, Accessible Ozempic 1 mg inject once weekly - Appropriate, Query effective, Safe, Accessible -Medications previously tried: none  -Current home glucose readings fasting glucose: 135 post prandial glucose: not checking  -Denies hypoglycemic/hyperglycemic symptoms -Current meal patterns:  breakfast: no breakfast except on Thursdays from church (eggs, toast, ham)  lunch: sandwich, hot dogs, hamburgers dinner: beans or corn or green peas and potatoes with the TV dinners; hamburger or hot dog with bun, pork chops snacks: fruit; popsicles  drinks: ginger ale - two a week; lots of water; no juice  -Current exercise: doing some walking in her hall or a block outside (10-15 minutes) -Educated  on A1c and blood sugar goals; Exercise [place] of 150 minutes per week; Benefits of routine self-monitoring of blood sugar; Carbohydrate counting and/or plate method -Counseled to check feet daily and get yearly eye exams -Counseled on diet and exercise extensively Recommended to continue current medication Recommended restarting blood sugar monitoring at home once daily.  Heart Failure ([place]: manage symptoms and prevent exacerbations) -Controlled -Last ejection fraction: 60-65% (Date: 05/04/20) -HF type: Diastolic -NYHA Class: II  (slight limitation of activity) -AHA HF Stage: C (Heart disease and symptoms present) -Current treatment: Furosemide 40 mg 1 tablet daily - Appropriate, Effective, Safe, Accessible Spironolactone 25 mg 1/2 tablet daily - Appropriate, Effective, Safe, Accessible -Medications previously tried: lisinopril -Current home BP/HR readings: 150-160s at home -Current dietary habits: limits salt intake -Current exercise habits: limited exercise -Educated on Importance of weighing daily; if you gain more than 3 pounds in one day or 5 pounds in one week, call cardiologist. Proper diuretic administration and potassium supplementation Importance of blood pressure control -Counseled on diet and exercise extensively Recommended to continue current medication  COPD ([place]: control symptoms and prevent exacerbations) -Controlled -Current treatment  Symbicort 2 puffs twice a day - Query Appropriate, Query effective, Safe, Accessible Albuterol nebulizer as needed - Appropriate, Effective, Safe, Accessible Albuterol HFA as needed - Appropriate, Effective, Safe, Accessible -Medications previously tried: unknown  -Gold Grade: Gold 2 (FEV1 50-79%) -Current COPD Classification:  B (high sx, <2 exacerbations/yr) -MMRC/CAT score: 1 -Pulmonary function testing: 07/2019 -Exacerbations requiring treatment in last 6 months: none -Patient reports consistent use of maintenance inhaler -Frequency of rescue inhaler use: rarely -Counseled on Proper inhaler technique; When to use rescue inhaler Differences between maintenance and rescue inhalers -Counseled on diet and exercise extensively Recommended to continue current medication  Depression/Anxiety ([place]: minimize symptoms) -Controlled -Current treatment: Duloxetine 30 mg 3 capsules daily - Appropriate, Effective, Safe, Accessible Quetiapine 50 mg 1 tablet twice daily - Appropriate, Effective, Safe, Accessible -Medications previously tried/failed: Rexulti -PHQ9:  0 -GAD7: 68 -Educated on Benefits of medication for symptom control Benefits of cognitive-behavioral therapy with or without medication -Recommended to continue current medication  Pain/fibromyalgia ([place]: minimize pain) -Uncontrolled -Current treatment  Diclofenac gel 1% as needed - Appropriate, Query effective, Safe, Accessible Gabapentin 300 mg 1 capsule twice daily - Appropriate, Query effective, Safe, Accessible -Medications previously tried: unknown  -Recommended to continue current medication  Allergic rhinitis ([place]: minimize symptoms) -Controlled -Current treatment  Flonase nasal spray as needed - Appropriate, Effective, Safe, Accessible Allegra 180 mg 1 tablet daily - Appropriate, Effective, Safe, Accessible -Medications previously tried: Xyzal  -Recommended to continue current medication  Seizures ([place]: prevent seizures) -Controlled -Current treatment  Zonisamide 100 mg 1 capsule at breakfast and 2 capsules at bedtime - Appropriate, Effective, Safe, Accessible -Medications previously tried: none  -Recommended to continue current medication  Bilateral DVT ([place]: prevent blood clots) -Controlled -Current treatment  Warfarin 7.5 mg or 10 mg tablets as directed - Appropriate, Effective, Safe, Accessible -Medications previously tried: none  -Counseled on diet and exercise extensively Recommended to continue current medication  GERD ([place]: minimize heartburn/acid reflux) -Controlled -Current treatment  Omeprazole 20 mg 1 capsule twice daily - currently taking once daily -Medications previously tried: none  -Counseled on long term risks of taking PPIs. Recommended tapering and stopping omeprazole.   Health Maintenance -Vaccine gaps: shingrix, Prevnar 20, COVID booster -Current therapy:  Meclizine 25 mg as needed Linzess 145 mcg 1 capsule every other day Metoclopramide 10 mg as needed -Educated on Cost vs benefit  of each product must be carefully weighed by  individual consumer -Patient is satisfied with current therapy and denies issues -Recommended to continue current medication     [name], PharmD, [place] Pharmacist [facility] [place]

## 2022-03-29 ENCOUNTER — Other Ambulatory Visit (HOSPITAL_COMMUNITY): Payer: Self-pay

## 2022-03-29 ENCOUNTER — Telehealth: Payer: Self-pay

## 2022-03-29 NOTE — Telephone Encounter (Signed)
Received notification from [facility] regarding a prior authorization for [name]   Authorization has been APPROVED   Authorization # Key: [id number]

## 2022-03-30 NOTE — Telephone Encounter (Signed)
Contacted [facility]. They inform the medication was transferred to [facility] in [place]. Reach out to pt and inform her the approval of the Ozempic. Advise pt to contact her [facility] in [place]. Verbalized understanding.

## 2022-03-31 ENCOUNTER — Telehealth: Payer: Self-pay | Admitting: Family Medicine

## 2022-03-31 NOTE — Telephone Encounter (Signed)
Pt is calling and seen dr banks on 1-8 and her back is still hurting and would  like to have MRI of her back. Please advise

## 2022-04-13 ENCOUNTER — Other Ambulatory Visit: Payer: Self-pay | Admitting: Family Medicine

## 2022-04-13 DIAGNOSIS — J4540 Moderate persistent asthma, uncomplicated: Secondary | ICD-10-CM

## 2022-04-13 DIAGNOSIS — J449 Chronic obstructive pulmonary disease, unspecified: Secondary | ICD-10-CM | POA: Diagnosis not present

## 2022-04-13 DIAGNOSIS — E876 Hypokalemia: Secondary | ICD-10-CM

## 2022-04-14 ENCOUNTER — Telehealth: Payer: Self-pay | Admitting: Pharmacist

## 2022-04-14 NOTE — Progress Notes (Signed)
Care Management & Coordination Services Pharmacy Team  Reason for Encounter: Medication coordination and delivery  Contacted patient to discuss medications and coordinate delivery from [facility]. Spoke with patient on 04/14/2022   Cycle dispensing form sent to [facility] for review.   Last adherence delivery date:03/28/2022      Patient is due for next adherence delivery on: 04/26/2022  This delivery to include: Adherence Packaging  30 Days  Zonisamide 100 mg  - take 1 capsule at breakfast and 2 capsules at dinner Gabapentin 300 mg - take 1 capsule with breakfast and dinner Symbicort 160/4.5 - use 2 puff twice daily Spironolactone 25 mg - take 1/2 tablet at breakfast Furosemide 80 mg - take 1 tablet at breakfast and dinner Pravastatin 40 mg  - take 1 tablet at dinner Cartia XT 240 mg - take 1 capsule before breakfast Invokana 100 mg - take 1 tablet before breakfast Potassium CL ER 59mq -  take 2 capsules at bedtime Warfarin 10 mg - take 1 tablet daily or as directed Metformin 500 mg - take 1 tablet at bedtime Fexofenadine 180 mg once daily at breakfast  Duloxetine 30 mg  - take 3 capsules at bedtime  Quetiapine 50 mg - take 1 tablet at breakfast and bedtime  Clonidine 0.1 mg - take 1 tablet before breakfast and bedtime Accu-chek test strips Accu-chek softclix lancets Albuterol HFA 2 puffs every 4 hours as needed Linzess 145 mcg - Take one capsule every other day Omeprazole - 20 mg - take 1 capsule before breakfast and dinner Warfarin 7.5 mg - take 1 tablet Mon-Wed-Fri or as directed  Patient declined the following medications this month: None  Confirmed delivery date of 04/26/2022, advised patient that pharmacy will contact them the morning of delivery.  Any concerns about your medications? Patient denies  How often do you forget or accidentally miss a dose? Patient denies any missed doses  Do you use a pillbox? No  Is patient in packaging Yes  If  yes  What is the date on your next pill pack? Patient unsure, she was not near her pill packs  Any concerns or issues with your packaging? Patient denies  Recent blood pressure readings are as follows: Patient states she is not near her readings  Recent blood glucose readings are as follows: Patient states she is not near her readings  Chart review:  Recent office visits:  None  Recent consult visits:  None  Hospital visits:  None  Medications: Outpatient Encounter Medications as of 04/14/2022  Medication Sig   ACCU-CHEK GUIDE test strip USE TO check blood glucose UP TO four times daily AS DIRECTED   Accu-Chek Softclix Lancets lancets USE TO check blood glucose UP TO four times daily AS DIRECTED Needs appointment for further refills   albuterol (PROVENTIL) (2.5 MG/3ML) 0.083% nebulizer solution USE 1 VIAL IN NEBULIZER EVERY 6 HOURS - and as needed   albuterol (VENTOLIN HFA) 108 (90 Base) MCG/ACT inhaler Inhale 2 puffs into the lungs every 4 (four) hours as needed for shortness of breath.   blood glucose meter kit and supplies KIT Dispense based on patient and insurance preference. Use up to four times daily as directed. (FOR ICD-9 250.00, 250.01).   budesonide-formoterol (SYMBICORT) 160-4.5 MCG/ACT inhaler Inhale 2 puffs into the lungs 2 (two) times daily.   cloNIDine (CATAPRES) 0.1 MG tablet TAKE ONE TABLET BY MOUTH BEFORE BREAKFAST and TAKE ONE TABLET BY MOUTH EVERYDAY AT BEDTIME   diclofenac sodium (VOLTAREN) 1 %  GEL Apply 2 g topically daily as needed (fibromyalgia pain).   diltiazem (CARDIZEM CD) 240 MG 24 hr capsule TAKE ONE CAPSULE BY MOUTH BEFORE BREAKFAST   DULoxetine (CYMBALTA) 30 MG capsule Take 90 mg by mouth at bedtime.   FEROSUL 325 (65 Fe) MG tablet TAKE ONE TABLET BY MOUTH ONCE DAILY   fexofenadine (ALLEGRA) 180 MG tablet TAKE ONE TABLET BY MOUTH ONCE DAILY   fluticasone (FLONASE) 50 MCG/ACT nasal spray Place 2 sprays into both nostrils 2 (two) times daily.    furosemide (LASIX) 80 MG tablet TAKE ONE TABLET BY MOUTH TWICE DAILY   gabapentin (NEURONTIN) 300 MG capsule TAKE ONE CAPSULE BY MOUTH TWICE DAILY   INVOKANA 100 MG TABS tablet TAKE ONE TABLET BY MOUTH BEFORE BREAKFAST   lidocaine (LIDODERM) 5 % Place 1 patch onto the skin daily. Remove & Discard patch within 12 hours or as directed by MD   LINZESS 145 MCG CAPS capsule TAKE ONE CAPSULE BY MOUTH every other DAY   metFORMIN (GLUCOPHAGE) 500 MG tablet TAKE ONE TABLET BY MOUTH EVERYDAY AT BEDTIME   methocarbamol (ROBAXIN) 500 MG tablet Take 1 tablet (500 mg total) by mouth 2 (two) times daily.   omeprazole (PRILOSEC) 20 MG capsule TAKE ONE CAPSULE BY MOUTH BEFORE BREAKFAST and TAKE ONE CAPSULE BY MOUTH EVERY EVENING   potassium chloride (MICRO-K) 10 MEQ CR capsule TAKE TWO CAPSULES BY MOUTH EVERYDAY AT BEDTIME   pravastatin (PRAVACHOL) 40 MG tablet TAKE ONE TABLET BY MOUTH EVERY EVENING   QUEtiapine (SEROQUEL) 50 MG tablet Take 50 mg by mouth 2 (two) times daily.   Semaglutide, 1 MG/DOSE, 4 MG/3ML SOPN Inject 1 mg as directed once a week.   spironolactone (ALDACTONE) 25 MG tablet TAKE 1/2 TABLET BY MOUTH ONCE DAILY   warfarin (COUMADIN) 10 MG tablet TAKE ONE TABLET BY MOUTH daily EXCEPT ON Monday, Wednesday, AND Friday OR AS DIRECTED   warfarin (COUMADIN) 7.5 MG tablet TAKE ONE TABLET BY MOUTH ON Monday, Wednesday, AND Fridays   zonisamide (ZONEGRAN) 100 MG capsule Take by mouth.   zonisamide (ZONEGRAN) 50 MG capsule 1 TABLET AM AND 2 TABLETS PM   Facility-Administered Encounter Medications as of 04/14/2022  Medication   sodium chloride flush (NS) 0.9 % injection 3 mL   BP Readings from Last 3 Encounters:  03/27/22 130/86  03/22/22 127/84  02/08/22 136/82    Pulse Readings from Last 3 Encounters:  03/27/22 76  03/22/22 83  02/08/22 92    Lab Results  Component Value Date/Time   HGBA1C 9.5 (H) 01/02/2022 02:50 PM   HGBA1C 8.4 (H) 04/21/2021 10:42 AM   Lab Results  Component Value Date    CREATININE 0.73 01/02/2022   BUN 12 01/02/2022   GFR 85.60 01/02/2022   GFRNONAA >60 02/18/2021   GFRAA 94 03/05/2020   NA 139 01/02/2022   K 3.6 01/02/2022   CALCIUM 8.9 01/02/2022   CO2 25 01/02/2022   [place] Pharmacist Assistant [phone]

## 2022-04-20 ENCOUNTER — Ambulatory Visit (INDEPENDENT_AMBULATORY_CARE_PROVIDER_SITE_OTHER): Payer: 59 | Admitting: Family Medicine

## 2022-04-20 ENCOUNTER — Other Ambulatory Visit: Payer: Self-pay | Admitting: Family Medicine

## 2022-04-20 ENCOUNTER — Encounter: Payer: Self-pay | Admitting: Family Medicine

## 2022-04-20 VITALS — BP 128/82 | HR 89 | Temp 98.6°F | Ht 66.0 in | Wt 284.2 lb

## 2022-04-20 DIAGNOSIS — L309 Dermatitis, unspecified: Secondary | ICD-10-CM

## 2022-04-20 DIAGNOSIS — E1140 Type 2 diabetes mellitus with diabetic neuropathy, unspecified: Secondary | ICD-10-CM | POA: Diagnosis not present

## 2022-04-20 DIAGNOSIS — J4540 Moderate persistent asthma, uncomplicated: Secondary | ICD-10-CM

## 2022-04-20 DIAGNOSIS — R32 Unspecified urinary incontinence: Secondary | ICD-10-CM

## 2022-04-20 DIAGNOSIS — I10 Essential (primary) hypertension: Secondary | ICD-10-CM | POA: Diagnosis not present

## 2022-04-20 DIAGNOSIS — I5032 Chronic diastolic (congestive) heart failure: Secondary | ICD-10-CM | POA: Diagnosis not present

## 2022-04-20 DIAGNOSIS — M546 Pain in thoracic spine: Secondary | ICD-10-CM

## 2022-04-20 DIAGNOSIS — Z1231 Encounter for screening mammogram for malignant neoplasm of breast: Secondary | ICD-10-CM

## 2022-04-20 LAB — POCT GLYCOSYLATED HEMOGLOBIN (HGB A1C): Hemoglobin A1C: 7 % — AB (ref 4.0–5.6)

## 2022-04-20 MED ORDER — TRAMADOL HCL 50 MG PO TABS: 50.0000 mg | ORAL_TABLET | Freq: Two times a day (BID) | ORAL | 0 refills | Status: DC | PRN

## 2022-04-20 MED ORDER — PROCARE BARIATRIC BRIEFS MISC: 11 refills | Status: DC

## 2022-04-20 MED ORDER — SEMAGLUTIDE (1 MG/DOSE) 4 MG/3ML ~~LOC~~ SOPN: 1.0000 mg | PEN_INJECTOR | SUBCUTANEOUS | 6 refills | Status: DC

## 2022-04-20 NOTE — Patient Instructions (Signed)
An order was placed for you to have a mammogram.  You should expect a phone call about scheduling this appointment.  A refill of tramadol sent to your pharmacy.  Continue doing things to help with your back pain.  For continued symptoms we will place a referral for you to see physical therapy.  A refill on Ozempic 1 mg was sent to your pharmacy.  A prescription for briefs was sent to the pharmacy.  Do not forget to apply a moisturizer to your skin daily.

## 2022-04-20 NOTE — Progress Notes (Signed)
Established Patient Office Visit   Subjective  Patient ID: [name], female    DOB: 04/05/1955  Age: 67 y.o. MRN: [id number]  Chief Complaint  Patient presents with   Medical Management of Chronic Issues    Follow up on DM    Pt is a 67 yo female with pmh sig for morbid obesity, HTN, DM II with neuropathy, diastolic CHF, aortic atherosclerosis, OSA, aortic atherosclerosis, insomnia, chronic pain, asthma seen for follow-up.  Patient endorses dull low back pain.  Had some improvement status post back injections.  Notices pain when wiping after BM.  Patient requesting refill on tramadol.  Patient notes increased skin irritation due to history of eczema.  Requesting refill on triamcinolone cream.  Patient also with history of urinary incontinence requesting Rx for adult briefs/3 XL.  Patient needs to schedule mammogram, last done 09/30/2020.      ROS Negative unless stated above    Objective:     BP 128/82 (BP Location: Left Arm, Cuff Size: Large)   Pulse 89   Temp 98.6 F (37 C) (Oral)   Ht '5\' 6"'$  (1.676 m)   Wt 284 lb 3.2 oz (128.9 kg)   SpO2 94%   BMI 45.87 kg/m    Physical Exam Constitutional:      General: She is not in acute distress.    Appearance: Normal appearance.  HENT:     Head: Normocephalic and atraumatic.     Nose: Nose normal.     Mouth/Throat:     Mouth: Mucous membranes are moist.  Cardiovascular:     Rate and Rhythm: Normal rate and regular rhythm.     Heart sounds: Normal heart sounds. No murmur heard.    No gallop.  Pulmonary:     Effort: Pulmonary effort is normal. No respiratory distress.     Breath sounds: Normal breath sounds. No wheezing, rhonchi or rales.  Skin:    General: Skin is warm and dry.  Neurological:     Mental Status: She is alert and oriented to person, place, and time.      Results for orders placed or performed in visit on 04/20/22  POC HgB A1c  Result Value Ref Range   Hemoglobin A1C 7.0 (A) 4.0 - 5.6 %    HbA1c POC (<> result, manual entry)     HbA1c, POC (prediabetic range)     HbA1c, POC (controlled diabetic range)        Assessment & Plan:  Type 2 diabetes mellitus with diabetic neuropathy, without long-term current use of insulin (HCC) -Hemoglobin A1c 9.5% on 01/02/2022 -Continue Ozempic weekly -     POCT glycosylated hemoglobin (Hb A1C) -     Semaglutide (1 MG/DOSE); Inject 1 mg as directed once a week.  Dispense: 3 mL; Refill: 6  Essential hypertension -Controlled -Continue current medications -Continue lifestyle modifications -Encouraged to check BP at home and keep a log to present to the clinic  Morbid obesity ([place]) -Body mass index is 45.87 kg/m. -Congratulated on weight loss, now 284 lbs.  Previously 287lbs on 03/27/2022. -Lifestyle modifications -Continue Ozempic  Chronic diastolic CHF (congestive heart failure) (HCC) -Euvolemic -Continue current medications -Continue follow-up with cardiology  Dermatitis -Discussed supportive care -Continue triamcinolone  Acute right-sided thoracic back pain -Supportive care including heat, stretching, topical analgesics, etc. -For continued or worsening symptoms advised to follow-up with Ortho -     traMADol HCl; Take 1 tablet (50 mg total) by mouth every 12 (twelve) hours as needed.  Dispense: 30 tablet; Refill: 0  Urinary incontinence, unspecified type -     PROCare Bariatric Briefs; Use as needed.  Dispense: 96 each; Refill: 11  Encounter for screening mammogram for malignant neoplasm of breast -     3D Screening Mammogram, Left and Right; Future   Return in about 3 months (around 07/19/2022), or if symptoms worsen or fail to improve.   Billie Ruddy, MD

## 2022-04-21 ENCOUNTER — Telehealth: Payer: Self-pay

## 2022-04-21 ENCOUNTER — Other Ambulatory Visit: Payer: Self-pay

## 2022-04-21 ENCOUNTER — Inpatient Hospital Stay: Payer: 59 | Attending: Oncology

## 2022-04-21 DIAGNOSIS — Z86718 Personal history of other venous thrombosis and embolism: Secondary | ICD-10-CM | POA: Insufficient documentation

## 2022-04-21 DIAGNOSIS — Z7901 Long term (current) use of anticoagulants: Secondary | ICD-10-CM | POA: Insufficient documentation

## 2022-04-21 DIAGNOSIS — J4540 Moderate persistent asthma, uncomplicated: Secondary | ICD-10-CM

## 2022-04-21 DIAGNOSIS — I82403 Acute embolism and thrombosis of unspecified deep veins of lower extremity, bilateral: Secondary | ICD-10-CM

## 2022-04-21 LAB — PROTIME-INR
INR: 2.5 — ABNORMAL HIGH (ref 0.8–1.2)
Prothrombin Time: 27 seconds — ABNORMAL HIGH (ref 11.4–15.2)

## 2022-04-21 MED ORDER — ALBUTEROL SULFATE HFA 108 (90 BASE) MCG/ACT IN AERS: INHALATION_SPRAY | RESPIRATORY_TRACT | 11 refills | Status: DC

## 2022-04-21 NOTE — Telephone Encounter (Signed)
Patient gave verbal understanding and had no further questions or concerns  

## 2022-04-21 NOTE — Telephone Encounter (Signed)
-----   Message from Ladell Pier, MD sent at 04/21/2022  3:03 PM EST ----- Please call patient, the PT/INR is in the therapeutic range, continue Coumadin at current dose, repeat PT/INR in 1 month

## 2022-05-05 DIAGNOSIS — J449 Chronic obstructive pulmonary disease, unspecified: Secondary | ICD-10-CM | POA: Diagnosis not present

## 2022-05-15 ENCOUNTER — Other Ambulatory Visit: Payer: Self-pay | Admitting: Oncology

## 2022-05-16 ENCOUNTER — Telehealth: Payer: Self-pay

## 2022-05-16 NOTE — Progress Notes (Signed)
Care Management & Coordination Services Pharmacy Team  Reason for Encounter: Medication coordination and delivery  Contacted patient to discuss medications and coordinate delivery from [facility]. Spoke with patient on 05/16/2022   Cycle dispensing form sent to [facility] for review.   Last adherence delivery date:04/26/2022      Patient is due for next adherence delivery on: 05/26/2022  This delivery to include: Adherence Packaging  30 Days  Zonisamide 100 mg  - take 1 capsule at breakfast and 2 capsules at dinner Gabapentin 300 mg - take 1 capsule with breakfast and dinner Symbicort 160/4.5 - use 2 puff twice daily Spironolactone 25 mg - take 1/2 tablet at breakfast Furosemide 80 mg - take 1 tablet at breakfast and dinner Pravastatin 40 mg  - take 1 tablet at dinner Cartia XT 240 mg - take 1 capsule before breakfast Invokana 100 mg - take 1 tablet before breakfast Potassium CL ER 63mq -  take 2 capsules at bedtime Warfarin 10 mg - take 1 tablet daily or as directed Metformin 500 mg - take 1 tablet at bedtime Fexofenadine 180 mg once daily at breakfast  Duloxetine 30 mg  - take 3 capsules at bedtime  Quetiapine 50 mg - take 1 tablet at breakfast and bedtime  Clonidine 0.1 mg - take 1 tablet before breakfast and bedtime Accu-chek test strips Accu-chek softclix lancets Albuterol HFA 2 puffs every 4 hours as needed Linzess 145 mcg - Take one capsule every other day Omeprazole - 20 mg - take 1 capsule before breakfast and dinner Warfarin 7.5 mg - take 1 tablet Mon-Wed-Fri or as directed  Patient declined the following medications this month: None  Confirmed delivery date of 05/26/2022, advised patient that pharmacy will contact them the morning of delivery.  Any concerns about your medications? Patient denies   How often do you forget or accidentally miss a dose? Patient denies any missed doses   Do you use a pillbox? No   Is patient in packaging Yes              If yes             What is the date on your next pill pack? 05/16/2022             Any concerns or issues with your packaging? Patient denies   Recent blood pressure readings are as follows: Patient states she lives in a retirement facility and she has the nurse check her blood pressure weekly.    Recent blood glucose readings are as follows: Patient states her fasting blood sugar this AM was 121, she states her readings are always between 112 - 121.   Chart review: Recent office visits:  04/20/2022 SGrier MittsMD - Patient was seen for Type 2 diabetes mellitus with diabetic neuropathy, without long-term current use of insulin and additional concerns. Started Tramadol 50 mg q 12 hrs prn.   Recent consult visits:  None  Hospital visits:  None  Medications: Outpatient Encounter Medications as of 05/16/2022  Medication Sig   ACCU-CHEK GUIDE test strip USE TO check blood glucose UP TO four times daily AS DIRECTED   Accu-Chek Softclix Lancets lancets USE TO check blood glucose UP TO four times daily AS DIRECTED Needs appointment for further refills   albuterol (PROVENTIL) (2.5 MG/3ML) 0.083% nebulizer solution USE 1 VIAL IN NEBULIZER EVERY 6 HOURS - and as needed   albuterol (VENTOLIN HFA) 108 (90 Base) MCG/ACT inhaler INHALE TWO PUFFS BY MOUTH  INTO LUNGS every FOUR hours AS NEEDED FOR SHORTNESS OF BREATH   blood glucose meter kit and supplies KIT Dispense based on patient and insurance preference. Use up to four times daily as directed. (FOR ICD-9 250.00, 250.01).   cloNIDine (CATAPRES) 0.1 MG tablet TAKE ONE TABLET BY MOUTH BEFORE BREAKFAST and TAKE ONE TABLET BY MOUTH EVERYDAY AT BEDTIME   diclofenac sodium (VOLTAREN) 1 % GEL Apply 2 g topically daily as needed (fibromyalgia pain).   diltiazem (CARDIZEM CD) 240 MG 24 hr capsule TAKE ONE CAPSULE BY MOUTH BEFORE BREAKFAST   DULoxetine (CYMBALTA) 30 MG capsule Take 90 mg by mouth at bedtime.   FEROSUL 325 (65 Fe) MG tablet TAKE ONE  TABLET BY MOUTH ONCE DAILY   fexofenadine (ALLEGRA) 180 MG tablet TAKE ONE TABLET BY MOUTH ONCE DAILY   fluticasone (FLONASE) 50 MCG/ACT nasal spray Place 2 sprays into both nostrils 2 (two) times daily.   furosemide (LASIX) 80 MG tablet TAKE ONE TABLET BY MOUTH TWICE DAILY   gabapentin (NEURONTIN) 300 MG capsule TAKE ONE CAPSULE BY MOUTH TWICE DAILY   Incontinence Supply Disposable (PROCARE BARIATRIC BRIEFS) MISC Use as needed.   INVOKANA 100 MG TABS tablet TAKE ONE TABLET BY MOUTH BEFORE BREAKFAST   lidocaine (LIDODERM) 5 % Place 1 patch onto the skin daily. Remove & Discard patch within 12 hours or as directed by MD   LINZESS 145 MCG CAPS capsule TAKE ONE CAPSULE BY MOUTH every other DAY   metFORMIN (GLUCOPHAGE) 500 MG tablet TAKE ONE TABLET BY MOUTH EVERYDAY AT BEDTIME   methocarbamol (ROBAXIN) 500 MG tablet Take 1 tablet (500 mg total) by mouth 2 (two) times daily.   omeprazole (PRILOSEC) 20 MG capsule TAKE ONE CAPSULE BY MOUTH BEFORE BREAKFAST and TAKE ONE CAPSULE BY MOUTH EVERY EVENING   potassium chloride (MICRO-K) 10 MEQ CR capsule TAKE TWO CAPSULES BY MOUTH EVERYDAY AT BEDTIME   pravastatin (PRAVACHOL) 40 MG tablet TAKE ONE TABLET BY MOUTH EVERY EVENING   QUEtiapine (SEROQUEL) 50 MG tablet Take 50 mg by mouth 2 (two) times daily.   Semaglutide, 1 MG/DOSE, 4 MG/3ML SOPN Inject 1 mg as directed once a week.   spironolactone (ALDACTONE) 25 MG tablet TAKE 1/2 TABLET BY MOUTH ONCE DAILY   SYMBICORT 160-4.5 MCG/ACT inhaler INHALE TWO PUFFS BY MOUTH INTO LUNGS twice daily   traMADol (ULTRAM) 50 MG tablet Take 1 tablet (50 mg total) by mouth every 12 (twelve) hours as needed.   warfarin (COUMADIN) 10 MG tablet TAKE ONE TABLET BY MOUTH daily EXCEPT ON Monday, Wednesday, AND Friday OR AS DIRECTED   warfarin (COUMADIN) 7.5 MG tablet TAKE ONE TABLET BY MOUTH ON Monday, Wednesdays, AND Fridays   zonisamide (ZONEGRAN) 100 MG capsule Take by mouth.   zonisamide (ZONEGRAN) 50 MG capsule 1 TABLET AM  AND 2 TABLETS PM   Facility-Administered Encounter Medications as of 05/16/2022  Medication   sodium chloride flush (NS) 0.9 % injection 3 mL   BP Readings from Last 3 Encounters:  04/20/22 128/82  03/27/22 130/86  03/22/22 127/84    Pulse Readings from Last 3 Encounters:  04/20/22 89  03/27/22 76  03/22/22 83    Lab Results  Component Value Date/Time   HGBA1C 7.0 (A) 04/20/2022 08:33 AM   HGBA1C 9.5 (H) 01/02/2022 02:50 PM   HGBA1C 8.4 (H) 04/21/2021 10:42 AM   Lab Results  Component Value Date   CREATININE 0.73 01/02/2022   BUN 12 01/02/2022   GFR 85.60 01/02/2022   GFRNONAA >  60 02/18/2021   GFRAA 94 03/05/2020   NA 139 01/02/2022   K 3.6 01/02/2022   CALCIUM 8.9 01/02/2022   CO2 25 01/02/2022   [place] Pharmacist Assistant [phone]

## 2022-05-22 ENCOUNTER — Inpatient Hospital Stay: Payer: 59 | Attending: Oncology

## 2022-05-22 DIAGNOSIS — Z7901 Long term (current) use of anticoagulants: Secondary | ICD-10-CM | POA: Diagnosis not present

## 2022-05-22 DIAGNOSIS — Z86718 Personal history of other venous thrombosis and embolism: Secondary | ICD-10-CM | POA: Insufficient documentation

## 2022-05-22 DIAGNOSIS — I82403 Acute embolism and thrombosis of unspecified deep veins of lower extremity, bilateral: Secondary | ICD-10-CM

## 2022-05-22 LAB — PROTIME-INR
INR: 2.6 — ABNORMAL HIGH (ref 0.8–1.2)
Prothrombin Time: 27.3 seconds — ABNORMAL HIGH (ref 11.4–15.2)

## 2022-05-23 ENCOUNTER — Telehealth: Payer: Self-pay

## 2022-05-23 ENCOUNTER — Other Ambulatory Visit: Payer: Self-pay

## 2022-05-23 DIAGNOSIS — I82403 Acute embolism and thrombosis of unspecified deep veins of lower extremity, bilateral: Secondary | ICD-10-CM

## 2022-05-23 NOTE — Telephone Encounter (Signed)
Notify the patient to continue same coumadin.

## 2022-05-23 NOTE — Telephone Encounter (Signed)
-----   Message from Ladell Pier, MD sent at 05/22/2022  7:33 PM EST ----- Please call patient, same coumadin, check PT INR 1 month

## 2022-05-30 DIAGNOSIS — J449 Chronic obstructive pulmonary disease, unspecified: Secondary | ICD-10-CM | POA: Diagnosis not present

## 2022-06-13 ENCOUNTER — Telehealth: Payer: Self-pay

## 2022-06-13 NOTE — Progress Notes (Signed)
Care Management & Coordination Services Pharmacy Team  Reason for Encounter: Medication coordination and delivery  Contacted patient to discuss medications and coordinate delivery from [facility]. Spoke with patient on 06/13/2022   Cycle dispensing form sent to [facility] for review.   Last adherence delivery date:05/26/2022      Patient is due for next adherence delivery on: 06/26/2022  This delivery to include: Adherence Packaging  30 Days  Zonisamide 100 mg  - take 1 capsule at breakfast and 2 capsules at dinner Gabapentin 300 mg - take 1 capsule with breakfast and dinner Symbicort 160/4.5 - use 2 puff twice daily Spironolactone 25 mg - take 1/2 tablet at breakfast Furosemide 80 mg - take 1 tablet at breakfast and dinner Pravastatin 40 mg  - take 1 tablet at dinner Cartia XT 240 mg - take 1 capsule before breakfast Invokana 100 mg - take 1 tablet before breakfast Potassium CL ER 72meq -  take 2 capsules at bedtime Warfarin 10 mg - take 1 tablet daily or as directed Metformin 500 mg - take 1 tablet at bedtime Fexofenadine 180 mg once daily at breakfast  Duloxetine 30 mg  - take 3 capsules at bedtime  Quetiapine 50 mg - take 1 tablet at breakfast and bedtime  Clonidine 0.1 mg - take 1 tablet before breakfast and bedtime Accu-chek test strips Accu-chek softclix lancets Albuterol HFA 2 puffs every 4 hours as needed Linzess 145 mcg - Take one capsule every other day Omeprazole - 20 mg - take 1 capsule before breakfast and dinner Warfarin 7.5 mg - take 1 tablet Mon-Wed-Fri or as directed Ozempic 4mg /59ml - 1 mg weekly  Patient declined the following medications this month: None  Confirmed delivery date of 06/26/2022, advised patient that pharmacy will contact them the morning of delivery.  Any concerns about your medications? Patient denies  How often do you forget or accidentally miss a dose? Patient denies  Is patient in packaging Yes  If yes  What is the date  on your next pill pack? 06/14/22  Any concerns or issues with your packaging? Patient denies   Recent blood pressure readings are as follows:  Patient states she did check it today and it was 148/90 however, she had been up running around and did not sit to rest before checking it, she will start resting before checking her blood pressures going forward.   Recent blood glucose readings are as follows: Patient states her fasting blood sugar this morning was 132   Chart review: Recent office visits:  None  Recent consult visits:  None  Hospital visits:  None  Medications: Outpatient Encounter Medications as of 06/13/2022  Medication Sig   ACCU-CHEK GUIDE test strip USE TO check blood glucose UP TO four times daily AS DIRECTED   Accu-Chek Softclix Lancets lancets USE TO check blood glucose UP TO four times daily AS DIRECTED Needs appointment for further refills   albuterol (PROVENTIL) (2.5 MG/3ML) 0.083% nebulizer solution USE 1 VIAL IN NEBULIZER EVERY 6 HOURS - and as needed   albuterol (VENTOLIN HFA) 108 (90 Base) MCG/ACT inhaler INHALE TWO PUFFS BY MOUTH INTO LUNGS every FOUR hours AS NEEDED FOR SHORTNESS OF BREATH   blood glucose meter kit and supplies KIT Dispense based on patient and insurance preference. Use up to four times daily as directed. (FOR ICD-9 250.00, 250.01).   cloNIDine (CATAPRES) 0.1 MG tablet TAKE ONE TABLET BY MOUTH BEFORE BREAKFAST and TAKE ONE TABLET BY MOUTH EVERYDAY AT BEDTIME  diclofenac sodium (VOLTAREN) 1 % GEL Apply 2 g topically daily as needed (fibromyalgia pain).   diltiazem (CARDIZEM CD) 240 MG 24 hr capsule TAKE ONE CAPSULE BY MOUTH BEFORE BREAKFAST   DULoxetine (CYMBALTA) 30 MG capsule Take 90 mg by mouth at bedtime.   FEROSUL 325 (65 Fe) MG tablet TAKE ONE TABLET BY MOUTH ONCE DAILY   fexofenadine (ALLEGRA) 180 MG tablet TAKE ONE TABLET BY MOUTH ONCE DAILY   fluticasone (FLONASE) 50 MCG/ACT nasal spray Place 2 sprays into both nostrils 2 (two)  times daily.   furosemide (LASIX) 80 MG tablet TAKE ONE TABLET BY MOUTH TWICE DAILY   gabapentin (NEURONTIN) 300 MG capsule TAKE ONE CAPSULE BY MOUTH TWICE DAILY   Incontinence Supply Disposable (PROCARE BARIATRIC BRIEFS) MISC Use as needed.   INVOKANA 100 MG TABS tablet TAKE ONE TABLET BY MOUTH BEFORE BREAKFAST   lidocaine (LIDODERM) 5 % Place 1 patch onto the skin daily. Remove & Discard patch within 12 hours or as directed by MD   LINZESS 145 MCG CAPS capsule TAKE ONE CAPSULE BY MOUTH every other DAY   metFORMIN (GLUCOPHAGE) 500 MG tablet TAKE ONE TABLET BY MOUTH EVERYDAY AT BEDTIME   methocarbamol (ROBAXIN) 500 MG tablet Take 1 tablet (500 mg total) by mouth 2 (two) times daily.   omeprazole (PRILOSEC) 20 MG capsule TAKE ONE CAPSULE BY MOUTH BEFORE BREAKFAST and TAKE ONE CAPSULE BY MOUTH EVERY EVENING   potassium chloride (MICRO-K) 10 MEQ CR capsule TAKE TWO CAPSULES BY MOUTH EVERYDAY AT BEDTIME   pravastatin (PRAVACHOL) 40 MG tablet TAKE ONE TABLET BY MOUTH EVERY EVENING   QUEtiapine (SEROQUEL) 50 MG tablet Take 50 mg by mouth 2 (two) times daily.   Semaglutide, 1 MG/DOSE, 4 MG/3ML SOPN Inject 1 mg as directed once a week.   spironolactone (ALDACTONE) 25 MG tablet TAKE 1/2 TABLET BY MOUTH ONCE DAILY   SYMBICORT 160-4.5 MCG/ACT inhaler INHALE TWO PUFFS BY MOUTH INTO LUNGS twice daily   traMADol (ULTRAM) 50 MG tablet Take 1 tablet (50 mg total) by mouth every 12 (twelve) hours as needed.   warfarin (COUMADIN) 10 MG tablet TAKE ONE TABLET BY MOUTH daily EXCEPT ON Monday, Wednesday, AND Friday OR AS DIRECTED   warfarin (COUMADIN) 7.5 MG tablet TAKE ONE TABLET BY MOUTH ON Monday, Wednesdays, AND Fridays   zonisamide (ZONEGRAN) 100 MG capsule Take by mouth.   zonisamide (ZONEGRAN) 50 MG capsule 1 TABLET AM AND 2 TABLETS PM   Facility-Administered Encounter Medications as of 06/13/2022  Medication   sodium chloride flush (NS) 0.9 % injection 3 mL   BP Readings from Last 3 Encounters:   04/20/22 128/82  03/27/22 130/86  03/22/22 127/84    Pulse Readings from Last 3 Encounters:  04/20/22 89  03/27/22 76  03/22/22 83    Lab Results  Component Value Date/Time   HGBA1C 7.0 (A) 04/20/2022 08:33 AM   HGBA1C 9.5 (H) 01/02/2022 02:50 PM   HGBA1C 8.4 (H) 04/21/2021 10:42 AM   Lab Results  Component Value Date   CREATININE 0.73 01/02/2022   BUN 12 01/02/2022   GFR 85.60 01/02/2022   GFRNONAA >60 02/18/2021   GFRAA 94 03/05/2020   NA 139 01/02/2022   K 3.6 01/02/2022   CALCIUM 8.9 01/02/2022   CO2 25 01/02/2022   [place] Pharmacist Assistant [phone]

## 2022-06-14 ENCOUNTER — Other Ambulatory Visit: Payer: Self-pay | Admitting: Nurse Practitioner

## 2022-06-14 ENCOUNTER — Other Ambulatory Visit: Payer: Self-pay | Admitting: Family Medicine

## 2022-06-14 DIAGNOSIS — K219 Gastro-esophageal reflux disease without esophagitis: Secondary | ICD-10-CM

## 2022-06-14 DIAGNOSIS — I82403 Acute embolism and thrombosis of unspecified deep veins of lower extremity, bilateral: Secondary | ICD-10-CM

## 2022-06-14 DIAGNOSIS — Z7901 Long term (current) use of anticoagulants: Secondary | ICD-10-CM

## 2022-06-20 ENCOUNTER — Ambulatory Visit: Payer: 59

## 2022-06-22 ENCOUNTER — Encounter: Payer: Self-pay | Admitting: Family Medicine

## 2022-06-22 ENCOUNTER — Ambulatory Visit (INDEPENDENT_AMBULATORY_CARE_PROVIDER_SITE_OTHER): Payer: 59 | Admitting: Family Medicine

## 2022-06-22 VITALS — BP 140/98 | HR 82 | Temp 98.9°F | Wt 293.0 lb

## 2022-06-22 DIAGNOSIS — G459 Transient cerebral ischemic attack, unspecified: Secondary | ICD-10-CM

## 2022-06-22 DIAGNOSIS — Z7901 Long term (current) use of anticoagulants: Secondary | ICD-10-CM | POA: Diagnosis not present

## 2022-06-22 DIAGNOSIS — H539 Unspecified visual disturbance: Secondary | ICD-10-CM | POA: Diagnosis not present

## 2022-06-22 DIAGNOSIS — I10 Essential (primary) hypertension: Secondary | ICD-10-CM | POA: Diagnosis not present

## 2022-06-22 DIAGNOSIS — J302 Other seasonal allergic rhinitis: Secondary | ICD-10-CM | POA: Diagnosis not present

## 2022-06-22 DIAGNOSIS — R202 Paresthesia of skin: Secondary | ICD-10-CM

## 2022-06-22 LAB — LIPID PANEL
Cholesterol: 207 mg/dL — ABNORMAL HIGH (ref 0–200)
HDL: 71.3 mg/dL (ref >39.00)
LDL Cholesterol: 115 mg/dL — ABNORMAL HIGH (ref 0–99)
NonHDL: 135.61
Total CHOL/HDL Ratio: 3
Triglycerides: 102 mg/dL (ref 0.0–149.0)
VLDL: 20.4 mg/dL (ref 0.0–40.0)

## 2022-06-22 LAB — CBC WITH DIFFERENTIAL/PLATELET
Basophils Absolute: 0 10*3/uL (ref 0.0–0.1)
Basophils Relative: 0.4 % (ref 0.0–3.0)
Eosinophils Absolute: 0.1 10*3/uL (ref 0.0–0.7)
Eosinophils Relative: 2.2 % (ref 0.0–5.0)
HCT: 35.6 % — ABNORMAL LOW (ref 36.0–46.0)
Hemoglobin: 11.6 g/dL — ABNORMAL LOW (ref 12.0–15.0)
Lymphocytes Relative: 30.8 % (ref 12.0–46.0)
Lymphs Abs: 2 10*3/uL (ref 0.7–4.0)
MCHC: 32.6 g/dL (ref 30.0–36.0)
MCV: 82.9 fl (ref 78.0–100.0)
Monocytes Absolute: 0.5 10*3/uL (ref 0.1–1.0)
Monocytes Relative: 7.8 % (ref 3.0–12.0)
Neutro Abs: 3.7 10*3/uL (ref 1.4–7.7)
Neutrophils Relative %: 58.8 % (ref 43.0–77.0)
Platelets: 349 10*3/uL (ref 150.0–400.0)
RBC: 4.29 Mil/uL (ref 3.87–5.11)
RDW: 17.9 % — ABNORMAL HIGH (ref 11.5–15.5)
WBC: 6.3 10*3/uL (ref 4.0–10.5)

## 2022-06-22 LAB — COMPREHENSIVE METABOLIC PANEL
ALT: 21 U/L (ref 0–35)
AST: 23 U/L (ref 0–37)
Albumin: 4.3 g/dL (ref 3.5–5.2)
Alkaline Phosphatase: 86 U/L (ref 39–117)
BUN: 12 mg/dL (ref 6–23)
CO2: 28 mEq/L (ref 19–32)
Calcium: 7.6 mg/dL — ABNORMAL LOW (ref 8.4–10.5)
Chloride: 99 mEq/L (ref 96–112)
Creatinine, Ser: 0.73 mg/dL (ref 0.40–1.20)
GFR: 85.32 mL/min (ref >60.00)
Glucose, Bld: 105 mg/dL — ABNORMAL HIGH (ref 70–99)
Potassium: 3.6 mEq/L (ref 3.5–5.1)
Sodium: 139 mEq/L (ref 135–145)
Total Bilirubin: 0.3 mg/dL (ref 0.2–1.2)
Total Protein: 8.2 g/dL (ref 6.0–8.3)

## 2022-06-22 LAB — TSH: TSH: 4.02 u[IU]/mL (ref 0.35–5.50)

## 2022-06-22 LAB — MAGNESIUM: Magnesium: 1.4 mg/dL — ABNORMAL LOW (ref 1.5–2.5)

## 2022-06-22 LAB — PROTIME-INR
INR: 2.1 ratio — ABNORMAL HIGH (ref 0.8–1.0)
Prothrombin Time: 22 s — ABNORMAL HIGH (ref 9.6–13.1)

## 2022-06-22 LAB — T4, FREE: Free T4: 0.65 ng/dL (ref 0.60–1.60)

## 2022-06-22 LAB — HEMOGLOBIN A1C: Hgb A1c MFr Bld: 7.2 % — ABNORMAL HIGH (ref 4.6–6.5)

## 2022-06-22 MED ORDER — FLUTICASONE PROPIONATE 50 MCG/ACT NA SUSP: 2.0000 | Freq: Two times a day (BID) | NASAL | 11 refills | Status: DC

## 2022-06-22 NOTE — Patient Instructions (Signed)
CT (expect a phone call about scheduling his appointment.  Orders for labs were also placed this visit.  If you have any return of symptoms please proceed to the nearest emergency department.

## 2022-06-22 NOTE — Progress Notes (Signed)
Established Patient Office Visit   Subjective  Patient ID: [name], female    DOB: 06/22/1955  Age: 67 y.o. MRN: [id number]  Chief Complaint  Patient presents with   Blurred Vision    Tingling with left side of face     Patient is a 67 year old female with pmh sig for HLD, HTN, fibromyalgia, OSA, lupus, lumbar sacral spondylosis, right renal mass, GERD, postsurgical hypothyroidism s/p right lobectomy,  h/o DVT on chronic anticoagulation, COPD, DM II, obesity, chronic diastolic CHF seen for acute concern. Pt with episode of blurred/cloudy vision, paresthesias of L face, and diaphoresis while on hwy.  Vision ok now.  No pain, HAs, weakness in extremities, difficulty speaking or moving during episode.  Pt ate that morning, bs was 148.  Does endorse receiving bad news as her sister called to say their uncle has prostate ca.  Pt was planning to drive out of town to see him.  Pt called office, advised to go to ED, pt declined.  Also notes BP liable x 1 wk.      Patient Active Problem List   Diagnosis Date Noted   Mixed hyperlipidemia 10/07/2021   Paroxysmal SVT (supraventricular tachycardia) 10/07/2021   Allergic rhinitis 11/29/2020   Allergic rhinitis due to pollen 11/29/2020   Moderate persistent asthma without complication 11/29/2020   Osteoarthritis of spine with radiculopathy, lumbar region 08/24/2020   Chronic back pain 08/18/2020   Chronic heart failure with preserved ejection fraction 02/27/2020   Obesity, diabetes, and hypertension syndrome 02/27/2020   Vitreomacular adhesion of both eyes 02/09/2020   Pseudophakia of both eyes 02/09/2020   Chronic diastolic CHF (congestive heart failure) 03/12/2019   Hypernatremia 03/09/2019   Suspected COVID-19 virus infection 03/09/2019   Acute encephalopathy 03/09/2019   Right sided weakness 02/20/2019   Thyroid mass 02/27/2017   Chronic pain of right knee 05/24/2016   Effusion, right knee 04/28/2016   Presence of right  artificial knee joint 04/28/2016   Abnormal LFTs    Decreased sensation    Paresthesia 06/12/2014   Obstruction of kidney 04/24/2014   Diabetes type 2, uncontrolled 04/24/2014   Chronic anticoagulation 04/24/2014   Crack cocaine use 04/24/2014   Depression 04/24/2014   Fibromyalgia 04/24/2014   OSA (obstructive sleep apnea) 04/24/2014   Lupus anticoagulant positive 04/24/2014   Adrenal mass, right 04/24/2014   Right kidney mass 04/24/2014   Hydronephrosis of right kidney 04/24/2014   Supratherapeutic INR 04/24/2014   Renal mass, right 04/24/2014   Diabetes mellitus with coincident hypertension    Right lower quadrant abdominal pain    Morbid obesity (HCC) 08/21/2013   Constipation 08/19/2013   Lumbosacral spondylosis without myelopathy 11/19/2012   Bilateral deep vein thromboses 12/13/2011   Abdominal pain 11/05/2011   Hypokalemia 11/05/2011   Sciatica 11/05/2011   Diabetes mellitus type 2 in obese (HCC) 11/05/2011   HTN (hypertension) 11/05/2011   Social History   Tobacco Use   Smoking status: Never   Smokeless tobacco: Never  Vaping Use   Vaping Use: Never used  Substance Use Topics   Alcohol use: No    Alcohol/week: 0.0 standard drinks of alcohol    Comment: beer in past    Drug use: Yes    Types: Cocaine    Comment: quit 2003-rehab progam in 2005   Family History  Problem Relation Age of Onset   Hypertension Mother    Diabetes Mother    Cancer Mother        Pancreatic  Hypertension Father    Heart failure Father    Hyperlipidemia Other    COPD Other    Allergies  Allergen Reactions   Lisinopril Swelling    THROAT SWELLING Pt states her throat started to "close up" after being on it for "so long."   Corticosteroids Rash    States she developed a rash on her tongue after a steroid injection in her bac      ROS Negative unless stated above    Objective:     BP (!) 140/98 (BP Location: Right Arm, Patient Position: Sitting, Cuff Size: Large)    Pulse 82   Temp 98.9 F (37.2 C) (Oral)   Wt 293 lb (132.9 kg)   SpO2 93%   BMI 47.29 kg/m  BP Readings from Last 3 Encounters:  06/22/22 (!) 140/98  04/20/22 128/82  03/27/22 130/86   Wt Readings from Last 3 Encounters:  06/22/22 293 lb (132.9 kg)  04/20/22 284 lb 3.2 oz (128.9 kg)  03/27/22 286 lb 12.8 oz (130.1 kg)      Physical Exam Constitutional:      General: She is not in acute distress.    Appearance: Normal appearance.  HENT:     Head: Normocephalic and atraumatic.     Nose: Nose normal.     Mouth/Throat:     Mouth: Mucous membranes are moist.  Eyes:     General: No visual field deficit.    Extraocular Movements: Extraocular movements intact.     Conjunctiva/sclera: Conjunctivae normal.     Pupils: Pupils are equal, round, and reactive to light.  Cardiovascular:     Rate and Rhythm: Normal rate and regular rhythm.     Heart sounds: Normal heart sounds. No murmur heard.    No gallop.  Pulmonary:     Effort: Pulmonary effort is normal. No respiratory distress.     Breath sounds: Normal breath sounds. No wheezing, rhonchi or rales.  Skin:    General: Skin is warm and dry.  Neurological:     Mental Status: She is alert and oriented to person, place, and time.     Cranial Nerves: Facial asymmetry present.     Sensory: Sensation is intact.     Motor: Motor function is intact.     Coordination: Coordination is intact.     Gait: Gait is intact.      No results found for any visits on 06/22/22.    Assessment & Plan:  TIA (transient ischemic attack) -     CT HEAD WO CONTRAST (5MM); Future -     CBC with Differential/Platelet -     Comprehensive metabolic panel -     Magnesium -     TSH -     T4, free -     Lipid panel -     Troponin I - -     Hemoglobin A1c  Vision changes -     EKG 12-Lead -     CT HEAD WO CONTRAST (5MM); Future -     Lipid panel -     Troponin I - -     Hemoglobin A1c  Tingling of face -     EKG 12-Lead -     CT HEAD WO  CONTRAST (5MM); Future -     Comprehensive metabolic panel -     Magnesium -     Hemoglobin A1c  Seasonal allergies -     Fluticasone Propionate; Place 2 sprays into both nostrils 2 (  two) times daily.  Dispense: 18.2 mL; Refill: 11  Essential hypertension -     CBC with Differential/Platelet -     Comprehensive metabolic panel -     Magnesium -     TSH -     T4, free  Chronic anticoagulation -     Protime-INR  Concern for TIA given symptoms, now resolved, and pmh.  Also consider seizure, migraine, etc. EKG with NSR, similar to prior studies.  Q waves noted,  most prominent in lead III.  Obtain labs, imaging.  Given strict ED precautions  Return if symptoms worsen or fail to improve.   Deeann SaintShannon R Brandey Vandalen, MD

## 2022-06-23 ENCOUNTER — Telehealth: Payer: Self-pay | Admitting: Family Medicine

## 2022-06-23 ENCOUNTER — Inpatient Hospital Stay: Payer: 59

## 2022-06-23 ENCOUNTER — Other Ambulatory Visit: Payer: Self-pay | Admitting: Family Medicine

## 2022-06-23 ENCOUNTER — Telehealth: Payer: Self-pay

## 2022-06-23 LAB — TROPONIN I: Troponin I: 11 ng/L (ref <=47)

## 2022-06-23 MED ORDER — OYSTER SHELL CALCIUM/D3 500-5 MG-MCG PO TABS: 2.0000 | ORAL_TABLET | Freq: Every day | ORAL | 0 refills | Status: DC

## 2022-06-23 MED ORDER — MAGNESIUM OXIDE 400 MG PO TABS: 400.0000 mg | ORAL_TABLET | Freq: Every day | ORAL | 0 refills | Status: AC

## 2022-06-23 NOTE — Progress Notes (Incomplete)
Established Patient Office Visit   Subjective  Patient ID: [name], female    DOB: Sep 18, 1955  Age: 67 y.o. MRN: [id number]  Chief Complaint  Patient presents with  . Blurred Vision    Tingling with left side of face     Patient is a 67 year old female with pmh sig for HLD, HTN, fibromyalgia, GERD, hypothyroidism,  h/o DVT on chronic anticoagulation, COPD, DM II, obesity, chronic diastolic CHF seen for acute concern. Pt with episode of blurred/cloudy vision, paresthesias of L face, and diaphoresis while on hwy.      {History (Optional):23778}  ROS Negative unless stated above    Objective:     BP (!) 140/98 (BP Location: Right Arm, Patient Position: Sitting, Cuff Size: Large)   Pulse 82   Temp 98.9 F (37.2 C) (Oral)   Wt 293 lb (132.9 kg)   SpO2 93%   BMI 47.29 kg/m  {Vitals History (Optional):23777}  Physical Exam Constitutional:      General: She is not in acute distress.    Appearance: Normal appearance.  HENT:     Head: Normocephalic and atraumatic.     Nose: Nose normal.     Mouth/Throat:     Mouth: Mucous membranes are moist.  Eyes:     General: No visual field deficit.    Extraocular Movements: Extraocular movements intact.     Conjunctiva/sclera: Conjunctivae normal.     Pupils: Pupils are equal, round, and reactive to light.  Cardiovascular:     Rate and Rhythm: Normal rate and regular rhythm.     Heart sounds: Normal heart sounds. No murmur heard.    No gallop.  Pulmonary:     Effort: Pulmonary effort is normal. No respiratory distress.     Breath sounds: Normal breath sounds. No wheezing, rhonchi or rales.  Skin:    General: Skin is warm and dry.  Neurological:     Mental Status: She is alert and oriented to person, place, and time.     Cranial Nerves: Facial asymmetry present.     Sensory: Sensation is intact.     Motor: Motor function is intact.     Coordination: Coordination is intact.     Gait: Gait is intact.      No  results found for any visits on 06/22/22.    Assessment & Plan:  TIA (transient ischemic attack) -     CT HEAD WO CONTRAST (5MM); Future -     CBC with Differential/Platelet -     Comprehensive metabolic panel -     Magnesium -     TSH -     T4, free -     Lipid panel -     Troponin I - -     Hemoglobin A1c  Vision changes -     EKG 12-Lead -     CT HEAD WO CONTRAST (5MM); Future -     Lipid panel -     Troponin I - -     Hemoglobin A1c  Tingling of face -     EKG 12-Lead -     CT HEAD WO CONTRAST (5MM); Future -     Comprehensive metabolic panel -     Magnesium -     Hemoglobin A1c  Seasonal allergies -     Fluticasone Propionate; Place 2 sprays into both nostrils 2 (two) times daily.  Dispense: 18.2 mL; Refill: 11  Essential hypertension -     CBC with Differential/Platelet -  Comprehensive metabolic panel -     Magnesium -     TSH -     T4, free  Chronic anticoagulation -     Protime-INR    Return if symptoms worsen or fail to improve.   Billie Ruddy, MD

## 2022-06-23 NOTE — Telephone Encounter (Signed)
Pt was advised by another provider that her magnesium was low. Patient wants to know what she needs to do

## 2022-06-23 NOTE — Telephone Encounter (Signed)
Pt advised that Dr. Salomon Fick has not had a chance to look over labs. Pt informed that we will call her ASAP when labs are resulted.

## 2022-06-23 NOTE — Telephone Encounter (Signed)
Per Dr. Truett Perna continue with the prescribed Coumadin regimen. The patient exhibited comprehension of the instructions and did not express any additional inquiries or worries.

## 2022-06-26 NOTE — Telephone Encounter (Signed)
Please see lab note.  Attempted to contact patient last week but no answer.  Message sent via MyChart.

## 2022-06-28 ENCOUNTER — Other Ambulatory Visit: Payer: 59

## 2022-07-04 ENCOUNTER — Encounter: Payer: Self-pay | Admitting: Podiatry

## 2022-07-04 ENCOUNTER — Telehealth: Payer: Self-pay

## 2022-07-04 ENCOUNTER — Ambulatory Visit (INDEPENDENT_AMBULATORY_CARE_PROVIDER_SITE_OTHER): Payer: 59 | Admitting: Podiatry

## 2022-07-04 VITALS — BP 138/70

## 2022-07-04 DIAGNOSIS — M79675 Pain in left toe(s): Secondary | ICD-10-CM

## 2022-07-04 DIAGNOSIS — E1142 Type 2 diabetes mellitus with diabetic polyneuropathy: Secondary | ICD-10-CM | POA: Diagnosis not present

## 2022-07-04 DIAGNOSIS — M79674 Pain in right toe(s): Secondary | ICD-10-CM | POA: Diagnosis not present

## 2022-07-04 DIAGNOSIS — B351 Tinea unguium: Secondary | ICD-10-CM | POA: Diagnosis not present

## 2022-07-04 NOTE — Progress Notes (Signed)
  Subjective:  Patient ID: [name], female    DOB: 1955-10-03,  MRN: [id number]  [name] presents to clinic today for at risk foot care with history of diabetic neuropathy and painful thick toenails that are difficult to trim. Pain interferes with ambulation. Aggravating factors include wearing enclosed shoe gear. Pain is relieved with periodic professional debridement.  Chief Complaint  Patient presents with   Nail Problem    DFC BS-did not check today A1C- do not know PCP-[facility] PCP [facility]- 1 month ago   New problem(s): None.   PCP is [name], MD.  Allergies  Allergen Reactions   Lisinopril Swelling    THROAT SWELLING Pt states her throat started to "close up" after being on it for "so long."   Corticosteroids Rash    States she developed a rash on her tongue after a steroid injection in her bac    Review of Systems: Negative except as noted in the HPI.  Objective:  Vitals:   07/04/22 1012  BP: 138/70   [name] is a pleasant 67 y.o. female morbidly obese in NAD. AAO x 3.  Vascular Examination: CFT <3 seconds b/l. DP pulses faintly palpable b/l. PT pulses diminished b/l. Digital hair absent. Skin temperature gradient warm to warm b/l. Nonpitting edema b/l LE. No pain with calf compression. No ischemia or gangrene. No cyanosis or clubbing noted b/l.    Neurological Examination: Pt has subjective symptoms of neuropathy. Protective sensation diminished with 10g monofilament b/l.  Dermatological Examination: Pedal skin warm and supple b/l. Toenails 2-5 b/l thick, discolored, elongated with subungual debris and pain on dorsal palpation.  No open wounds b/l LE. No interdigital macerations noted b/l LE.   Anonychia right great toe. Nailbed(s) epithelialized.  Left great toe with evidence of total nail avulsion. Nail spicule noted medial border.  No hyperkeratotic nor porokeratotic lesions present on today's visit.  Musculoskeletal  Examination: Muscle strength 5/5 to b/l LE. Hammertoe(s) noted to the L 4th toe. Pes planus deformity noted bilateral LE.  Radiographs: None  Assessment/Plan: 1. Pain due to onychomycosis of toenails of both feet   2. Diabetic peripheral neuropathy associated with type 2 diabetes mellitus     -Consent given for treatment as described below: -Examined patient. -Retained nail spicule debrided left great toe and filed with dremel. -Mycotic toenails 2-5 bilaterally were debrided in length and girth with sterile nail nippers and dremel without iatrogenic bleeding. -Patient/POA to call should there be question/concern in the interim.   Return in about 3 months (around 10/03/2022).  Freddie Breech, DPM

## 2022-07-04 NOTE — Progress Notes (Unsigned)
Care Management & Coordination Services Pharmacy Note  07/05/2022 Name:  Tymika Grilli Kyllonen MRN:  [id number] DOB:  1955/06/25  Summary: A1C not at [place] <7 LDL not at [place] <70  Recommendations/Changes made from today's visit: -Counseled patient to check BG at least once daily in order for [place] to adequately assess control -Counseled on low carbohydrate diet -Counseled on cholesterol-lowering diet and exercise for at least per week.  -Future consideration - If LDL still elevated at next check, consider addition of zetia, as patient is intolerant of higher intensity statins. Pt elects for diet changes first  Follow up plan: -DM in 1 month -DM in 3 months -Pharmacist visit in 4 months   Subjective: Caoilainn Sacks Jaskolski is an 67 y.o. year old female who is a primary patient of Salomon Fick, Bettey Mare, MD.  The care coordination team was consulted for assistance with disease management and care coordination needs.    Engaged with patient by telephone for follow up visit.  Recent office visits: 06/22/22 Abbe Amsterdam, MD - For TIA, low magnesium. Start Mag-OX 400mg  daily x 4 days and Oscal-D 2 tabs daily x 5 days  04/20/2022 Abbe Amsterdam MD - Patient was seen for Type 2 diabetes mellitus with diabetic neuropathy, without long-term current use of insulin and additional concerns. Started Tramadol 50 mg q 12 hrs prn.   03/27/21 Abbe Amsterdam, MD: Patient presented for back pain post ER visit.    03/03/2022 Theresa Mulligan LPN - Medicare annual wellness exam.   02/08/2022 Abbe Amsterdam MD - Patient was seen for Type 2 diabetes mellitus with diabetic neuropathy, without long-term current use of insulin  and additional concerns. Increased Semaglutide to 1mg  weekly. Follow up in 2 months  Recent consult visits: 07/04/22 Geralynn Rile, DPM (Podiatry) - For pain due to onychomycosis of toenails of both feet, no med changes  Hospital visits: None in previous 6 months   Objective:  Lab  Results  Component Value Date   CREATININE 0.73 06/22/2022   BUN 12 06/22/2022   GFR 85.32 06/22/2022   EGFR 91 02/21/2021   GFRNONAA >60 02/18/2021   GFRAA 94 03/05/2020   NA 139 06/22/2022   K 3.6 06/22/2022   CALCIUM 7.6 (L) 06/22/2022   CO2 28 06/22/2022   GLUCOSE 105 (H) 06/22/2022    Lab Results  Component Value Date/Time   HGBA1C 7.2 (H) 06/22/2022 10:54 AM   HGBA1C 7.0 (A) 04/20/2022 08:33 AM   HGBA1C 9.5 (H) 01/02/2022 02:50 PM   GFR 85.32 06/22/2022 10:54 AM   GFR 85.60 01/02/2022 02:50 PM   MICROALBUR 40.3 (H) 01/02/2022 02:50 PM   MICROALBUR 12.5 (H) 08/11/2020 11:16 AM    Last diabetic Eye exam:  Lab Results  Component Value Date/Time   HMDIABEYEEXA No Retinopathy 02/07/2022 12:00 AM    Last diabetic Foot exam: No results found for: "HMDIABFOOTEX"   Lab Results  Component Value Date   CHOL 207 (H) 06/22/2022   HDL 71.30 06/22/2022   LDLCALC 115 (H) 06/22/2022   TRIG 102.0 06/22/2022   CHOLHDL 3 06/22/2022       Latest Ref Rng & Units 06/22/2022   10:54 AM 01/02/2022    2:50 PM 04/21/2021   10:42 AM  Hepatic Function  Total Protein 6.0 - 8.3 g/dL 8.2  7.8  8.5   Albumin 3.5 - 5.2 g/dL 4.3  4.1  4.3   AST 0 - 37 U/L 23  16  19    ALT 0 - 35 U/L 21  19  20   Alk Phosphatase 39 - 117 U/L 86  79  80   Total Bilirubin 0.2 - 1.2 mg/dL 0.3  0.2  0.3     Lab Results  Component Value Date/Time   TSH 4.02 06/22/2022 10:54 AM   TSH 5.08 01/02/2022 02:50 PM   FREET4 0.65 06/22/2022 10:54 AM   FREET4 0.67 01/02/2022 02:50 PM       Latest Ref Rng & Units 06/22/2022   10:54 AM 04/21/2021   10:42 AM 02/28/2021    8:00 AM  CBC  WBC 4.0 - 10.5 K/uL 6.3  5.7    Hemoglobin 12.0 - 15.0 g/dL 40.3  47.4  25.9   Hematocrit 36.0 - 46.0 % 35.6  39.4  37.0   Platelets 150.0 - 400.0 K/uL 349.0  316.0      Lab Results  Component Value Date/Time   VD25OH 31.30 04/21/2021 10:42 AM   VD25OH 18.14 (L) 02/02/2021 02:29 PM   VITAMINB12 915 (H) 04/21/2021 10:42 AM    VITAMINB12 373 02/02/2021 02:29 PM    Clinical ASCVD: Yes  The 10-year ASCVD risk score (Arnett DK, et al., 2019) is: 26.5%   Values used to calculate the score:     Age: 31 years     Sex: Female     Is Non-Hispanic African American: Yes     Diabetic: Yes     Tobacco smoker: No     Systolic Blood Pressure: 138 mmHg     Is BP treated: Yes     HDL Cholesterol: 71.3 mg/dL     Total Cholesterol: 207 mg/dL    DEXA 56/05/8754 - Normal BMD - Due for updated scan     06/22/2022   10:00 AM 04/20/2022    8:37 AM 03/27/2022    2:52 PM  Depression screen PHQ 2/9  Decreased Interest 0 1 2  Down, Depressed, Hopeless 0 0 0  PHQ - 2 Score 0 1 2  Altered sleeping  0 0  Tired, decreased energy  1 1  Change in appetite  1 2  Feeling bad or failure about yourself   0 0  Trouble concentrating  1 1  Moving slowly or fidgety/restless  0 0  Suicidal thoughts  0 0  PHQ-9 Score  4 6  Difficult doing work/chores  Not difficult at all Somewhat difficult     Social History   Tobacco Use  Smoking Status Never  Smokeless Tobacco Never   BP Readings from Last 3 Encounters:  07/04/22 138/70  06/22/22 (!) 140/98  04/20/22 128/82   Pulse Readings from Last 3 Encounters:  06/22/22 82  04/20/22 89  03/27/22 76   Wt Readings from Last 3 Encounters:  06/22/22 293 lb (132.9 kg)  04/20/22 284 lb 3.2 oz (128.9 kg)  03/27/22 286 lb 12.8 oz (130.1 kg)   BMI Readings from Last 3 Encounters:  06/22/22 47.29 kg/m  04/20/22 45.87 kg/m  03/27/22 46.29 kg/m    Allergies  Allergen Reactions   Lisinopril Swelling    THROAT SWELLING Pt states her throat started to "close up" after being on it for "so long."   Corticosteroids Rash    States she developed a rash on her tongue after a steroid injection in her bac    Medications Reviewed Today     Reviewed by Sherrill Raring, RPH (Pharmacist) on 07/05/22 at 1445  Med List Status: <None>   Medication Order Taking? Sig Documenting Provider Last Dose  Status Informant  ACCU-CHEK GUIDE test strip [id number] No USE TO check blood glucose UP TO four times daily AS DIRECTED [name], MD Taking Active   Accu-Chek Softclix Lancets lancets [id number] No USE TO check blood glucose UP TO four times daily AS DIRECTED Needs appointment for further refills [name], MD Taking Active   albuterol (PROVENTIL) (2.5 MG/3ML) 0.083% nebulizer solution [id number] No USE 1 VIAL IN NEBULIZER EVERY 6 HOURS - and as needed [name], MD Taking Active   albuterol (VENTOLIN HFA) 108 (90 Base) MCG/ACT inhaler [id number] No INHALE TWO PUFFS BY MOUTH INTO LUNGS every FOUR hours AS NEEDED FOR SHORTNESS OF Georgena Spurling, MD Taking Active   blood glucose meter kit and supplies KIT [id number] No Dispense based on patient and insurance preference. Use up to four times daily as directed. (FOR ICD-9 250.00, 250.01). [name], MD Taking Active   calcium-vitamin D Ruthell Rummage WITH D) 500-5 MG-MCG tablet [id number]  Take 2 tablets by mouth daily for 5 days. [name], MD  Expired 06/28/22 2359   cloNIDine (CATAPRES) 0.1 MG tablet [id number] No TAKE ONE TABLET BY MOUTH BEFORE BREAKFAST and TAKE ONE TABLET BY MOUTH EVERYDAY AT BEDTIME [name], MD Taking Active   diclofenac sodium (VOLTAREN) 1 % GEL [id number] No Apply 2 g topically daily as needed (fibromyalgia pain). [provider] Taking Active Child  diltiazem (CARDIZEM CD) 240 MG 24 hr capsule [id number] No TAKE ONE CAPSULE BY MOUTH BEFORE BREAKFAST [name], MD Taking Active   DULoxetine (CYMBALTA) 30 MG capsule [id number] No Take 90 mg by mouth at bedtime. [provider] Taking Active   FEROSUL 325 (65 Fe) MG tablet [id number] No TAKE ONE TABLET BY MOUTH ONCE DAILY [name], MD Taking Active   fexofenadine (ALLEGRA) 180 MG tablet [id number] No TAKE ONE TABLET BY MOUTH ONCE DAILY [name], MD Taking Active   fluticasone (FLONASE) 50 MCG/ACT nasal spray  [id number]  Place 2 sprays into both nostrils 2 (two) times daily. [name], MD  Active   furosemide (LASIX) 80 MG tablet [id number] No TAKE ONE TABLET BY MOUTH TWICE DAILY Chandrasekhar, [name] A, MD Taking Active   gabapentin (NEURONTIN) 300 MG capsule [id number] No TAKE ONE CAPSULE BY MOUTH TWICE DAILY [name], MD Taking Active   Incontinence Supply Disposable (PROCARE BARIATRIC BRIEFS) MISC [id number] No Use as needed. [name], MD Taking Active   INVOKANA 100 MG TABS tablet [id number] No TAKE ONE TABLET BY MOUTH BEFORE BREAKFAST Chandrasekhar, [name] A, MD Taking Active   lidocaine (LIDODERM) 5 % [id number] No Place 1 patch onto the skin daily. Remove & Discard patch within 12 hours or as directed by MD Blue, Soijett A, PA-C Taking Active   LINZESS 145 MCG CAPS capsule [id number] No TAKE ONE CAPSULE BY MOUTH every other DAY [name], MD Taking Active   metFORMIN (GLUCOPHAGE) 500 MG tablet [id number] No TAKE ONE TABLET BY MOUTH EVERYDAY AT BEDTIME [name], MD Taking Active   methocarbamol (ROBAXIN) 500 MG tablet [id number] No Take 1 tablet (500 mg total) by mouth 2 (two) times daily. Blue, Soijett A, PA-C Taking Active   omeprazole (PRILOSEC) 20 MG capsule [id number] No TAKE ONE CAPSULE BY MOUTH BEFORE BREAKFAST and TAKE ONE CAPSULE BY MOUTH EVERY EVENING [name], MD Taking Active   potassium chloride (MICRO-K) 10 MEQ CR capsule [id number] No TAKE TWO CAPSULES BY MOUTH EVERYDAY AT BEDTIME [name],  MD Taking Active   pravastatin (PRAVACHOL) 40 MG tablet [id number] No TAKE ONE TABLET BY MOUTH EVERY EVENING [name], MD Taking Active   QUEtiapine (SEROQUEL) 50 MG tablet [id number] No Take 50 mg by mouth 2 (two) times daily. [provider] Taking Active   Semaglutide, 1 MG/DOSE, 4 MG/3ML SOPN [id number] No Inject 1 mg as directed once a week. [name], MD Taking Active   sodium chloride flush (NS) 0.9 % injection 3 mL [id number]    Riley Lam A, MD  Active   spironolactone (ALDACTONE) 25 MG tablet [id number] No TAKE 1/2 TABLET BY MOUTH ONCE DAILY Christell Constant, MD Taking Active   SYMBICORT 160-4.5 MCG/ACT inhaler [id number] No INHALE TWO PUFFS BY MOUTH INTO LUNGS twice daily [name], MD Taking Active   traMADol (ULTRAM) 50 MG tablet [id number] No Take 1 tablet (50 mg total) by mouth every 12 (twelve) hours as needed. [name], MD Taking Active   warfarin (COUMADIN) 10 MG tablet [id number] No TAKE ONE TABLET BY MOUTH daily EXCEPT ON Monday, Wednesday, AND Friday OR AS DIRECTED Ladene Artist, MD Taking Active   warfarin (COUMADIN) 7.5 MG tablet [id number] No TAKE ONE TABLET BY MOUTH ON Monday, Wednesdays, AND Fridays Ladene Artist, MD Taking Active   zonisamide (ZONEGRAN) 100 MG capsule [id number] No Take by mouth. [provider] Taking Active   zonisamide (ZONEGRAN) 50 MG capsule [id number] No 1 TABLET AM AND 2 TABLETS PM [provider] Taking Active             SDOH:  (Social Determinants of Health) assessments and interventions performed: Yes SDOH Interventions    Flowsheet Row Care Coordination from 07/05/2022 in [facility] Clinical Support from 03/03/2022 in [facility] [place] Chronic Care Management from 12/21/2021 in [facility] [place] Clinical Support from 03/02/2021 in [facility] [place] Chronic Care Management from 10/07/2020 in [facility] [place] Clinical Support from 03/05/2020 in [facility] [place]  SDOH Interventions        Food Insecurity Interventions Intervention Not Indicated Intervention Not Indicated -- -- -- Intervention Not Indicated  Housing Interventions Intervention Not Indicated Intervention Not Indicated -- -- -- Intervention Not Indicated  Transportation Interventions -- Intervention Not Indicated --  -- Intervention Not Indicated Intervention Not Indicated  Utilities Interventions -- Intervention Not Indicated -- -- -- --  Alcohol Usage Interventions -- Intervention Not Indicated (Score <7) -- -- -- --  Depression Interventions/Treatment  -- -- -- Currently on Treatment -- PHQ2-9 Score <4 Follow-up Not Indicated  Financial Strain Interventions -- Intervention Not Indicated Intervention Not Indicated -- Intervention Not Indicated Intervention Not Indicated  Physical Activity Interventions -- Intervention Not Indicated -- -- -- Intervention Not Indicated  Stress Interventions -- Intervention Not Indicated -- -- -- Intervention Not Indicated  Social Connections Interventions -- Intervention Not Indicated -- -- -- Intervention Not Indicated       Medication Assistance: None required.  Patient affirms current coverage meets needs.  Medication Access: Within the past 30 days, how often has patient missed a dose of medication? None Is a pillbox or other method used to improve adherence? Yes  Factors that may affect medication adherence? no barriers identified Are meds synced by current pharmacy? Yes  Are meds delivered by current pharmacy? Yes  Does patient experience delays in picking up medications due to transportation concerns? No   [facility] Reviewed: Is patient disadvantaged to use [facility]?: No  Current Rx insurance plan: [facility] Name and location of Current pharmacy:  [facility] - [place], [place] - [place] [id number] [place] [address] Phone: [phone] Fax: [phone]  [facility] services reviewed with patient today?: No  Patient requests to transfer care to [facility]?: No  Reason patient declined to change pharmacies: Patient is already actively enrolled with [facility]  Compliance/Adherence/Medication fill history: Care Gaps: AWV - completed 03/03/2022,  scheduled  03/07/2023 Last eye exam - 02/09/2022 Last foot exam - 12/06/2021 Last BP - 140/98 on 06/22/2022 Last A1C - 7.2 on 06/22/2022 Tdap - never done Shingrix - never done Covid - overdue Pneumovax - postponed Hep C Screen - postponed  Star-Rating Drugs: Invokana 100 mg - last filled 06/21/2022 30 DS at [facility] Metformin 500 mg - last filled 06/21/2022 30 [facility] at [facility] Pravastatin 40 mg - last filled 06/21/2022 30 [facility] at [facility] Semaglutide 1 mg/dose - last filled 06/21/2022 28 [facility]   Assessment/Plan Hyperlipidemia: (LDL [place] < 70) -Uncontrolled -Current treatment: Pravastatin 40mg  1 qpm Appropriate, Effective, Safe, Accessible -Medications previously tried: Simvastatin, Lipitor, Crestor  -Current dietary patterns: does include fried foods and white bread -Current exercise habits: walks daily -Educated on Cholesterol goals;  Benefits of statin for ASCVD risk reduction; Importance of limiting foods high in cholesterol; Exercise Gillie of 150 minutes per week; -Counseled on diet and exercise extensively Recommended to continue current medication Recommended updated lipid panel at next routine PCP f/u to assess LDL improvement. If still not at [place], will need to consider addition of zetia  as patient has tried and failed higher intensity statins  Diabetes (A1c [place] <7%) -Uncontrolled -Current medications: Invokana 100mg  1 qd Appropriate, Effective, Safe, Accessible Metformin 500mg  1 qhs  Appropriate, Effective, Safe, Accessible Ozempic 1 mg once weekly Appropriate, Effective, Safe, Accessible -Medications previously tried: one  -Current home glucose readings Only checks about once a week post prandial glucose:  112 yesterday -Denies hypoglycemic/hyperglycemic symptoms -Current meal patterns:  Not discussed -Current exercise: see above -Educated on A1c and blood sugar goals; Complications of diabetes including kidney damage, retinal damage, and cardiovascular  disease; Exercise [place] of 150 minutes per week; Prevention and management of hypoglycemic episodes; Benefits of routine self-monitoring of blood sugar; Carbohydrate counting and/or plate method -Counseled to check feet daily and get yearly eye exams -Counseled on diet and exercise extensively Recommended to continue current medication  Sherrill Raring Clinical Pharmacist [phone]

## 2022-07-04 NOTE — Progress Notes (Signed)
Care Management & Coordination Services Pharmacy Team  Reason for Encounter: Appointment Reminder  Contacted patient to confirm telephone appointment with Delano Metz, PharmD on 07/05/2022 at 2:30. Spoke with patient on 07/04/2022   Do you have any problems getting your medications? Patient denies  What is your top health concern you would like to discuss at your upcoming visit? Patient denies  Have you seen any other providers since your last visit with PCP? Patient denies   Care Gaps: AWV - completed 03/03/2022,  scheduled 03/07/2023 Last eye exam - 02/09/2022 Last foot exam - 12/06/2021 Last BP - 140/98 on 06/22/2022 Last A1C - 7.2 on 06/22/2022 Tdap - never done Shingrix - never done Covid - overdue Pneumovax - postponed Hep C Screen - postponed   Star Rating Drugs: Invokana 100 mg - last filled 06/21/2022 30 [facility] Metformin 500 mg - last filled 06/21/2022 30 [facility] at [facility] Pravastatin 40 mg - last filled 06/21/2022 30 [facility] at [facility] Semaglutide 1 mg/dose - last filled 06/21/2022 28 DS at [facility]   Inetta Fermo [facility]  Clinical Pharmacist Assistant [phone]

## 2022-07-05 ENCOUNTER — Ambulatory Visit: Payer: 59

## 2022-07-05 ENCOUNTER — Other Ambulatory Visit: Payer: 59

## 2022-07-07 DIAGNOSIS — G4733 Obstructive sleep apnea (adult) (pediatric): Secondary | ICD-10-CM | POA: Diagnosis not present

## 2022-07-12 ENCOUNTER — Other Ambulatory Visit (INDEPENDENT_AMBULATORY_CARE_PROVIDER_SITE_OTHER): Payer: 59

## 2022-07-12 LAB — VITAMIN D 25 HYDROXY (VIT D DEFICIENCY, FRACTURES): VITD: 22.2 ng/mL — ABNORMAL LOW (ref 30.00–100.00)

## 2022-07-12 LAB — MAGNESIUM: Magnesium: 1.4 mg/dL — ABNORMAL LOW (ref 1.5–2.5)

## 2022-07-13 ENCOUNTER — Telehealth: Payer: Self-pay

## 2022-07-13 NOTE — Progress Notes (Signed)
Care Management & Coordination Services Pharmacy Team  Reason for Encounter: Medication coordination and delivery  Contacted patient to discuss medications and coordinate delivery from [facility]. Spoke with patient on 07/13/2022   Cycle dispensing form sent to Julious Payer for review.   Last adherence delivery date:06/26/2022      Patient is due for next adherence delivery on: 07/26/2022  This delivery to include: Adherence Packaging  30 Days  Zonisamide 100 mg  - take 1 capsule at breakfast and 2 capsules at dinner Gabapentin 300 mg - take 1 capsule with breakfast and dinner Symbicort 160/4.5 - use 2 puff twice daily Spironolactone 25 mg - take 1/2 tablet at breakfast Furosemide 80 mg - take 1 tablet at breakfast and dinner Pravastatin 40 mg  - take 1 tablet at dinner Cartia XT 240 mg - take 1 capsule before breakfast Invokana 100 mg - take 1 tablet before breakfast Potassium CL ER -  take 2 capsules at bedtime Warfarin 10 mg - take 1 tablet daily or as directed Metformin 500 mg - take 1 tablet at bedtime Fexofenadine 180 mg once daily at breakfast  Duloxetine 30 mg  - take 3 capsules at bedtime  Quetiapine 50 mg - take 1 tablet at breakfast and bedtime  Clonidine 0.1 mg - take 1 tablet before breakfast and bedtime Accu-chek test strips Accu-chek softclix lancets Albuterol HFA 2 puffs every 4 hours as needed Linzess 145 mcg - Take one capsule every other day Omeprazole - 20 mg - take 1 capsule before breakfast and dinner Warfarin 7.5 mg - take 1 tablet Mon-Wed-Fri or as directed Ozempic /51ml - 1 mg weekly  Patient declined the following medications this month: Fluticasone spray - 2 sprays twice daily   Confirmed delivery date of 07/26/2022, advised patient that pharmacy will contact them the morning of delivery.  Any concerns about your medications? Patient denies   How often do you forget or accidentally miss a dose? Patient denies  Is patient in  packaging Yes  If yes  What is the date on your next pill pack? 07/13/2022  Any concerns or issues with your packaging? Patient denies   Recent blood pressure readings are as follows: Patient declines  Recent blood glucose readings are as follows: Patient declines   Chart review: Recent office visits:  None  Recent consult visits:  None  Hospital visits:  None  Medications: Outpatient Encounter Medications as of 07/13/2022  Medication Sig   ACCU-CHEK GUIDE test strip USE TO check blood glucose UP TO four times daily AS DIRECTED   Accu-Chek Softclix Lancets lancets USE TO check blood glucose UP TO four times daily AS DIRECTED Needs appointment for further refills   albuterol (PROVENTIL) (2.5 MG/3ML) 0.083% nebulizer solution USE 1 VIAL IN NEBULIZER EVERY 6 HOURS - and as needed   albuterol (VENTOLIN HFA) 108 (90 Base) MCG/ACT inhaler INHALE TWO PUFFS BY MOUTH INTO LUNGS every FOUR hours AS NEEDED FOR SHORTNESS OF BREATH   blood glucose meter kit and supplies KIT Dispense based on patient and insurance preference. Use up to four times daily as directed. (FOR ICD-9 250.00, 250.01).   calcium-vitamin D (OSCAL WITH D) 500-5 MG-MCG tablet Take 2 tablets by mouth daily for 5 days.   cloNIDine (CATAPRES) 0.1 MG tablet TAKE ONE TABLET BY MOUTH BEFORE BREAKFAST and TAKE ONE TABLET BY MOUTH EVERYDAY AT BEDTIME   diclofenac sodium (VOLTAREN) 1 % GEL Apply 2 g topically daily as needed (fibromyalgia pain).   diltiazem (  CARDIZEM CD) 240 MG 24 hr capsule TAKE ONE CAPSULE BY MOUTH BEFORE BREAKFAST   DULoxetine (CYMBALTA) 30 MG capsule Take 90 mg by mouth at bedtime.   FEROSUL 325 (65 Fe) MG tablet TAKE ONE TABLET BY MOUTH ONCE DAILY   fexofenadine (ALLEGRA) 180 MG tablet TAKE ONE TABLET BY MOUTH ONCE DAILY   fluticasone (FLONASE) 50 MCG/ACT nasal spray Place 2 sprays into both nostrils 2 (two) times daily.   furosemide (LASIX) 80 MG tablet TAKE ONE TABLET BY MOUTH TWICE DAILY   gabapentin  (NEURONTIN) 300 MG capsule TAKE ONE CAPSULE BY MOUTH TWICE DAILY   Incontinence Supply Disposable (PROCARE BARIATRIC BRIEFS) MISC Use as needed.   INVOKANA 100 MG TABS tablet TAKE ONE TABLET BY MOUTH BEFORE BREAKFAST   lidocaine (LIDODERM) 5 % Place 1 patch onto the skin daily. Remove & Discard patch within 12 hours or as directed by MD   LINZESS 145 MCG CAPS capsule TAKE ONE CAPSULE BY MOUTH every other DAY   metFORMIN (GLUCOPHAGE) 500 MG tablet TAKE ONE TABLET BY MOUTH EVERYDAY AT BEDTIME   methocarbamol (ROBAXIN) 500 MG tablet Take 1 tablet (500 mg total) by mouth 2 (two) times daily.   omeprazole (PRILOSEC) 20 MG capsule TAKE ONE CAPSULE BY MOUTH BEFORE BREAKFAST and TAKE ONE CAPSULE BY MOUTH EVERY EVENING   potassium chloride (MICRO-K) 10 MEQ CR capsule TAKE TWO CAPSULES BY MOUTH EVERYDAY AT BEDTIME   pravastatin (PRAVACHOL) 40 MG tablet TAKE ONE TABLET BY MOUTH EVERY EVENING   QUEtiapine (SEROQUEL) 50 MG tablet Take 50 mg by mouth 2 (two) times daily.   Semaglutide, 1 MG/DOSE, 4 MG/3ML SOPN Inject 1 mg as directed once a week.   spironolactone (ALDACTONE) 25 MG tablet TAKE 1/2 TABLET BY MOUTH ONCE DAILY   SYMBICORT 160-4.5 MCG/ACT inhaler INHALE TWO PUFFS BY MOUTH INTO LUNGS twice daily   traMADol (ULTRAM) 50 MG tablet Take 1 tablet (50 mg total) by mouth every 12 (twelve) hours as needed.   warfarin (COUMADIN) 10 MG tablet TAKE ONE TABLET BY MOUTH daily EXCEPT ON Monday, Wednesday, AND Friday OR AS DIRECTED   warfarin (COUMADIN) 7.5 MG tablet TAKE ONE TABLET BY MOUTH ON Monday, Wednesdays, AND Fridays   zonisamide (ZONEGRAN) 100 MG capsule Take by mouth.   zonisamide (ZONEGRAN) 50 MG capsule 1 TABLET AM AND 2 TABLETS PM   Facility-Administered Encounter Medications as of 07/13/2022  Medication   sodium chloride flush (NS) 0.9 % injection 3 mL   BP Readings from Last 3 Encounters:  07/04/22 138/70  06/22/22 (!) 140/98  04/20/22 128/82    Pulse Readings from Last 3 Encounters:   06/22/22 82  04/20/22 89  03/27/22 76    Lab Results  Component Value Date/Time   HGBA1C 7.2 (H) 06/22/2022 10:54 AM   HGBA1C 7.0 (A) 04/20/2022 08:33 AM   HGBA1C 9.5 (H) 01/02/2022 02:50 PM   Lab Results  Component Value Date   CREATININE 0.73 06/22/2022   BUN 12 06/22/2022   GFR 85.32 06/22/2022   GFRNONAA >60 02/18/2021   GFRAA 94 03/05/2020   NA 139 06/22/2022   K 3.6 06/22/2022   CALCIUM 7.6 (L) 06/22/2022   CO2 28 06/22/2022   Inetta Fermo CMA  Clinical Pharmacist Assistant [phone]

## 2022-07-14 ENCOUNTER — Inpatient Hospital Stay: Payer: 59 | Admitting: Nurse Practitioner

## 2022-07-14 ENCOUNTER — Inpatient Hospital Stay: Payer: 59 | Attending: Oncology

## 2022-07-14 DIAGNOSIS — Z7901 Long term (current) use of anticoagulants: Secondary | ICD-10-CM | POA: Diagnosis not present

## 2022-07-14 DIAGNOSIS — Z86718 Personal history of other venous thrombosis and embolism: Secondary | ICD-10-CM | POA: Insufficient documentation

## 2022-07-14 LAB — PROTIME-INR
INR: 1.5 — ABNORMAL HIGH (ref 0.8–1.2)
Prothrombin Time: 17.5 seconds — ABNORMAL HIGH (ref 11.4–15.2)

## 2022-07-15 LAB — PTH, INTACT AND CALCIUM
Calcium: 8.6 mg/dL (ref 8.6–10.4)
PTH: 92 pg/mL — ABNORMAL HIGH (ref 16–77)

## 2022-07-17 ENCOUNTER — Telehealth: Payer: Self-pay

## 2022-07-17 ENCOUNTER — Ambulatory Visit (INDEPENDENT_AMBULATORY_CARE_PROVIDER_SITE_OTHER): Payer: 59 | Admitting: Surgical

## 2022-07-17 ENCOUNTER — Other Ambulatory Visit (INDEPENDENT_AMBULATORY_CARE_PROVIDER_SITE_OTHER): Payer: 59

## 2022-07-17 DIAGNOSIS — M79642 Pain in left hand: Secondary | ICD-10-CM

## 2022-07-17 NOTE — Telephone Encounter (Signed)
-----   Message from Ladene Artist, MD sent at 07/14/2022  4:30 PM EDT ----- Please call patient, the PT/INR is subtherapeutic, is she taking Coumadin?,  Continue current dose and repeat PT/INR in 2-3 weeks

## 2022-07-17 NOTE — Telephone Encounter (Signed)
The patient reported missing two doses of medication but has since resumed taking them as prescribed. The patient is scheduled for an appointment on May 9th.

## 2022-07-19 ENCOUNTER — Other Ambulatory Visit: Payer: Self-pay

## 2022-07-19 ENCOUNTER — Ambulatory Visit (INDEPENDENT_AMBULATORY_CARE_PROVIDER_SITE_OTHER): Payer: 59 | Admitting: Family Medicine

## 2022-07-19 VITALS — BP 144/96 | HR 88 | Temp 98.7°F | Wt 292.2 lb

## 2022-07-19 DIAGNOSIS — E1140 Type 2 diabetes mellitus with diabetic neuropathy, unspecified: Secondary | ICD-10-CM | POA: Diagnosis not present

## 2022-07-19 DIAGNOSIS — R29898 Other symptoms and signs involving the musculoskeletal system: Secondary | ICD-10-CM

## 2022-07-19 DIAGNOSIS — I10 Essential (primary) hypertension: Secondary | ICD-10-CM

## 2022-07-19 DIAGNOSIS — Z7985 Long-term (current) use of injectable non-insulin antidiabetic drugs: Secondary | ICD-10-CM | POA: Diagnosis not present

## 2022-07-19 DIAGNOSIS — I82403 Acute embolism and thrombosis of unspecified deep veins of lower extremity, bilateral: Secondary | ICD-10-CM

## 2022-07-19 NOTE — Progress Notes (Signed)
   Established Patient Office Visit   Subjective  Patient ID: [name], female    DOB: 1955-10-13  Age: 67 y.o. MRN: [id number]  Chief Complaint  Patient presents with   Follow-up    Micah Flesher to doctor about hand, goes back in a few weeks.     Pt is a 67 yo female with diastolic CHF, HTN, morbid obesity, insomnia, DM2, aortic atherosclerosis, OSA who was seen for f/u.  Pt seen by ortho for hand.  States L 3rd digit trigger.  Not getting stuck in the past patient had cyst on left wrist removed.  Has follow-up in a few weeks to discuss other options.  Patient states she is doing well on Ozempic.  Denies any GI side effects.  Taking medications consistently.      ROS Negative unless stated above    Objective:     BP (!) 144/96 (BP Location: Left Arm, Patient Position: Sitting, Cuff Size: Normal)   Pulse 88   Temp 98.7 F (37.1 C) (Oral)   Wt 292 lb 3.2 oz (132.5 kg)   SpO2 97%   BMI 47.16 kg/m    Physical Exam Constitutional:      General: She is not in acute distress.    Appearance: Normal appearance.  HENT:     Head: Normocephalic and atraumatic.     Nose: Nose normal.     Mouth/Throat:     Mouth: Mucous membranes are moist.  Cardiovascular:     Rate and Rhythm: Normal rate and regular rhythm.     Heart sounds: Normal heart sounds. No murmur heard.    No gallop.  Pulmonary:     Effort: Pulmonary effort is normal. No respiratory distress.     Breath sounds: Normal breath sounds. No wheezing, rhonchi or rales.  Musculoskeletal:     Comments: Mild edema left hand.  No triggering of left third digit.  Skin:    General: Skin is warm and dry.  Neurological:     Mental Status: She is alert and oriented to person, place, and time.      No results found for any visits on 07/19/22.    Assessment & Plan:  Essential hypertension  Type 2 diabetes mellitus with diabetic neuropathy, without long-term current use of insulin (HCC)  Finger dysfunction  BP  elevated.  Recheck.  For continued elevation increase medication.  DM2 controlled.  Hemoglobin A1c 7.2% on 06/22/2022.  Continue follow-up with Ortho for finger concern.  Follow-up as needed.  [name], MD

## 2022-07-21 ENCOUNTER — Ambulatory Visit: Payer: 59

## 2022-07-21 ENCOUNTER — Other Ambulatory Visit: Payer: Self-pay | Admitting: Family Medicine

## 2022-07-21 DIAGNOSIS — J449 Chronic obstructive pulmonary disease, unspecified: Secondary | ICD-10-CM | POA: Diagnosis not present

## 2022-07-21 DIAGNOSIS — E559 Vitamin D deficiency, unspecified: Secondary | ICD-10-CM

## 2022-07-21 MED ORDER — MAGNESIUM 400 MG PO TABS
400.0000 mg | ORAL_TABLET | Freq: Two times a day (BID) | ORAL | 0 refills | Status: AC
Start: 2022-07-21 — End: 2022-07-24

## 2022-07-21 MED ORDER — VITAMIN D (ERGOCALCIFEROL) 1.25 MG (50000 UNIT) PO CAPS
50000.0000 [IU] | ORAL_CAPSULE | ORAL | 0 refills | Status: DC
Start: 2022-07-21 — End: 2023-02-19

## 2022-07-23 ENCOUNTER — Encounter: Payer: Self-pay | Admitting: Surgical

## 2022-07-23 NOTE — Progress Notes (Signed)
Office Visit Note   Patient: [name]           Date of Birth: 09/23/1955           MRN: [id number] Visit Date: 07/17/2022 Requested by: [name], MD [address] [place],  [place] [address] PCP: [name], MD  Subjective: Chief Complaint  Patient presents with   Left Hand - Pain    HPI: [name] is a 67 y.o. female who presents to the office reporting left hand pain.  Patient states that she has had increased pain over the last 2 months with insidious onset.  No history of injury.  She complains of constant pain with swelling and she localizes this pain to the base of the middle finger on the palmar aspect.  She denies any fevers or chills.  Not noticed any redness.  She is left-hand dominant.  No history of prior surgery to this finger.  She has no triggering.  Has difficulty gripping objects due to pain..                ROS: All systems reviewed are negative as they relate to the chief complaint within the history of present illness.  Patient denies fevers or chills.  Assessment & Plan: Visit Diagnoses:  1. Pain in left hand     Plan: Patient is a 67 year old female who presents for evaluation of left hand pain.  She has pain localizing to the base of the middle finger around the A1 pulley.  She has radiographs demonstrating no significant osseous abnormality of the left hand in this area.  Her exam and symptoms are most consistent with early trigger finger with her just experiencing pain and no triggering currently.  We discussed options available to patient and after discussion, patient would like to try Voltaren gel to apply to this area of maximal tenderness 3-4 times per day and reevaluate in 4 weeks with Dr. August Saucer.  Cannot take anti-inflammatories due to her taking Coumadin.  If this does not provide lasting relief by the time she sees Dr. August Saucer, could consider further evaluation with MRI scan versus diagnostic trigger finger  injection.  Follow-Up Instructions: No follow-ups on file.   Orders:  Orders Placed This Encounter  Procedures   XR Hand Complete Left   No orders of the defined types were placed in this encounter.     Procedures: No procedures performed   Clinical Data: No additional findings.  Objective: Vital Signs: There were no vitals taken for this visit.  Physical Exam:  Constitutional: Patient appears well-developed HEENT:  Head: Normocephalic Eyes:EOM are normal Neck: Normal range of motion Cardiovascular: Normal rate Pulmonary/chest: Effort normal Neurologic: Patient is alert Skin: Skin is warm Psychiatric: Patient has normal mood and affect  Ortho Exam: Ortho exam demonstrates intact EPL, FPL, finger abduction, grip strength though this is painful.  She has ability to make full composite fist and full active extension of the left middle finger.  She does have moderate tenderness to palpation over the A1 pulley of the middle finger with no triggering that is felt or observed on exam today.  Specialty Comments:  No specialty comments available.  Imaging: No results found.   PMFS History: Patient Active Problem List   Diagnosis Date Noted   Mixed hyperlipidemia 10/07/2021   Paroxysmal SVT (supraventricular tachycardia) 10/07/2021   Allergic rhinitis 11/29/2020   Allergic rhinitis due to pollen 11/29/2020   Moderate persistent asthma without complication 11/29/2020  Osteoarthritis of spine with radiculopathy, lumbar region 08/24/2020   Chronic back pain 08/18/2020   Chronic heart failure with preserved ejection fraction (HCC) 02/27/2020   Obesity, diabetes, and hypertension syndrome (HCC) 02/27/2020   Vitreomacular adhesion of both eyes 02/09/2020   Pseudophakia of both eyes 02/09/2020   Chronic diastolic CHF (congestive heart failure) (HCC) 03/12/2019   Hypernatremia 03/09/2019   Suspected COVID-19 virus infection 03/09/2019   Acute encephalopathy 03/09/2019    Right sided weakness 02/20/2019   Thyroid mass 02/27/2017   Chronic pain of right knee 05/24/2016   Effusion, right knee 04/28/2016   Presence of right artificial knee joint 04/28/2016   Abnormal LFTs    Decreased sensation    Paresthesia 06/12/2014   Obstruction of kidney 04/24/2014   Diabetes type 2, uncontrolled 04/24/2014   Chronic anticoagulation 04/24/2014   Crack cocaine use 04/24/2014   Depression 04/24/2014   Fibromyalgia 04/24/2014   OSA (obstructive sleep apnea) 04/24/2014   Lupus anticoagulant positive 04/24/2014   Adrenal mass, right (HCC) 04/24/2014   Right kidney mass 04/24/2014   Hydronephrosis of right kidney 04/24/2014   Supratherapeutic INR 04/24/2014   Renal mass, right 04/24/2014   Diabetes mellitus with coincident hypertension (HCC)    Right lower quadrant abdominal pain    Morbid obesity (HCC) 08/21/2013   Constipation 08/19/2013   Lumbosacral spondylosis without myelopathy 11/19/2012   Bilateral deep vein thromboses (HCC) 12/13/2011   Abdominal pain 11/05/2011   Hypokalemia 11/05/2011   Sciatica 11/05/2011   Diabetes mellitus type 2 in obese (HCC) 11/05/2011   HTN (hypertension) 11/05/2011   Past Medical History:  Diagnosis Date   Adrenal mass (HCC) 03/226   Benign   Arthritis    knees/multiple orthopedic conditons; lower back   Asthma    per pt   Clotting disorder (HCC)    +beta-2-glycoprotein IgA antibody   COPD (chronic obstructive pulmonary disease) (HCC)    inhalers dependent on environment   Depression    Diabetes mellitus    120s usually fasting -  dx more than 10 yrs ago   Dizziness, nonspecific    DVT (deep venous thrombosis) (HCC)    Recurrent   Fibromyalgia    GERD (gastroesophageal reflux disease)    Heart murmur    History of blood transfusion    History of cocaine abuse (HCC)    Remote history    Hyperlipidemia    Hypertension    takes meds daily   Hypothyroidism    Lupus Anticoagulant Positive    On home oxygen  therapy    at night   Shortness of breath    exertion or lying flat   Sleep apnea    2l of oxygen at night (as of 12/6, she used to)    Family History  Problem Relation Age of Onset   Hypertension Mother    Diabetes Mother    Cancer Mother        Pancreatic   Hypertension Father    Heart failure Father    Hyperlipidemia Other    COPD Other     Past Surgical History:  Procedure Laterality Date   ABDOMINAL HYSTERECTOMY  1989   Fibroids   ANTERIOR LUMBAR FUSION  01/03/2012   Procedure: ANTERIOR LUMBAR FUSION 1 LEVEL;  Surgeon: Carmela Hurt, MD;  Location: [facility];  Service: Neurosurgery;  Laterality: N/A;  Lumbar Four-Five Anterior Lumbar Interbody Fusion with Instrumentation   BACK SURGERY     cervical spine---disk disease   CARPAL TUNNEL  RELEASE     Bilateral   CESAREAN SECTION     CHOLECYSTECTOMY     CYSTOSCOPY/RETROGRADE/URETEROSCOPY/STONE EXTRACTION WITH BASKET Right 04/26/2014   Procedure: CYSTOSCOPY/ RIGHT RETROGRADE/ RIGHT URETEROSCOPY/URETERAL AND RENAL PELVIS BIOPSY;  Surgeon: Sebastian Ache, MD;  Location: [facility];  Service: Urology;  Laterality: Right;   gallstones removed     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   bilateral   right elbow surgery     RIGHT HEART CATH N/A 02/28/2021   Procedure: RIGHT HEART CATH;  Surgeon: Kathleene Hazel, MD;  Location: [facility];  Service: Cardiovascular;  Laterality: N/A;   THYROID LOBECTOMY Right 02/27/2017   THYROID LOBECTOMY Right 02/27/2017   Procedure: RIGHT THYROID LOBECTOMY;  Surgeon: Harriette Bouillon, MD;  Location: [facility];  Service: General;  Laterality: Right;   TUBAL LIGATION     Social History   Occupational History   Not on file  Tobacco Use   Smoking status: Never   Smokeless tobacco: Never  Vaping Use   Vaping Use: Never used  Substance and Sexual Activity   Alcohol use: No    Alcohol/week: 0.0 standard drinks of alcohol    Comment: beer in past    Drug use: Yes    Types: Cocaine     Comment: quit 2003-rehab progam in 2005   Sexual activity: Never

## 2022-07-24 ENCOUNTER — Ambulatory Visit: Payer: 59

## 2022-07-24 ENCOUNTER — Other Ambulatory Visit: Payer: Self-pay | Admitting: Family Medicine

## 2022-07-24 DIAGNOSIS — M2141 Flat foot [pes planus] (acquired), right foot: Secondary | ICD-10-CM

## 2022-07-24 DIAGNOSIS — E1142 Type 2 diabetes mellitus with diabetic polyneuropathy: Secondary | ICD-10-CM

## 2022-07-24 DIAGNOSIS — R0982 Postnasal drip: Secondary | ICD-10-CM

## 2022-07-24 DIAGNOSIS — M2042 Other hammer toe(s) (acquired), left foot: Secondary | ICD-10-CM

## 2022-07-24 NOTE — Progress Notes (Signed)
Patient presents to the office today for diabetic shoe and insole measuring.  Patient was measured with brannock device to determine size and width for 1 pair of extra depth shoes and foam casted for 3 pair of insoles.   ABN signed.   Documentation of medical necessity will be sent to patient's treating diabetic doctor to verify and sign.   Patient's diabetic provider: DR Abbe Amsterdam  Shoes and insoles will be ordered at that time and patient will be notified for an appointment for fitting when they arrive.   Brannock measurement: 10W  Patient shoe selection-   1st   Shoe choice:   [id number]  Shoe size ordered: 10.5 W

## 2022-07-26 IMAGING — DX DG CHEST 2V
2 series · 2 of 2 positions shown · non-contrast
Comparison: July 09, 2019

CLINICAL DATA: Increased shortness of breath, history of CHF and
obstructive sleep apnea

EXAM:
CHEST - 2 VIEW

[chest pa]
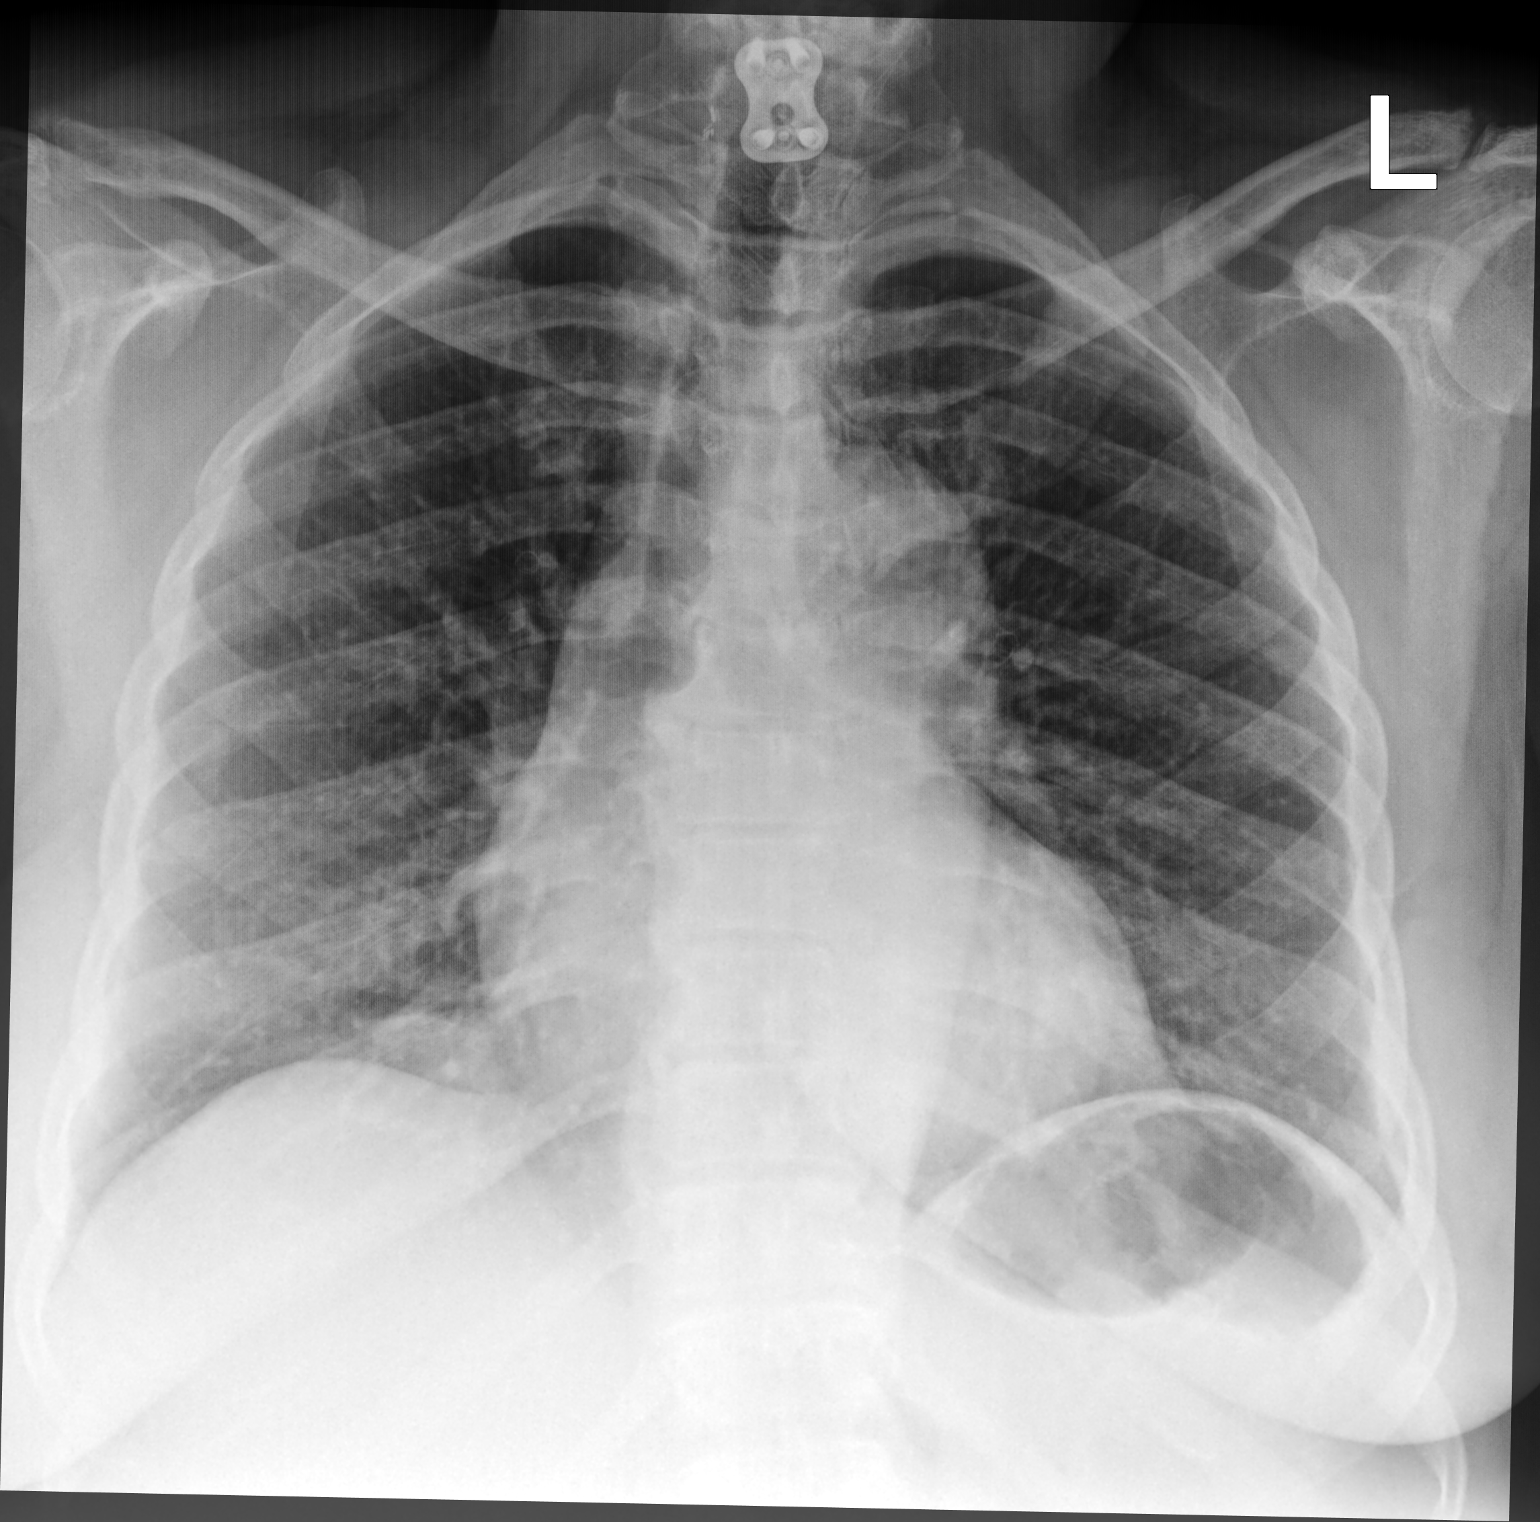

[chest lat]
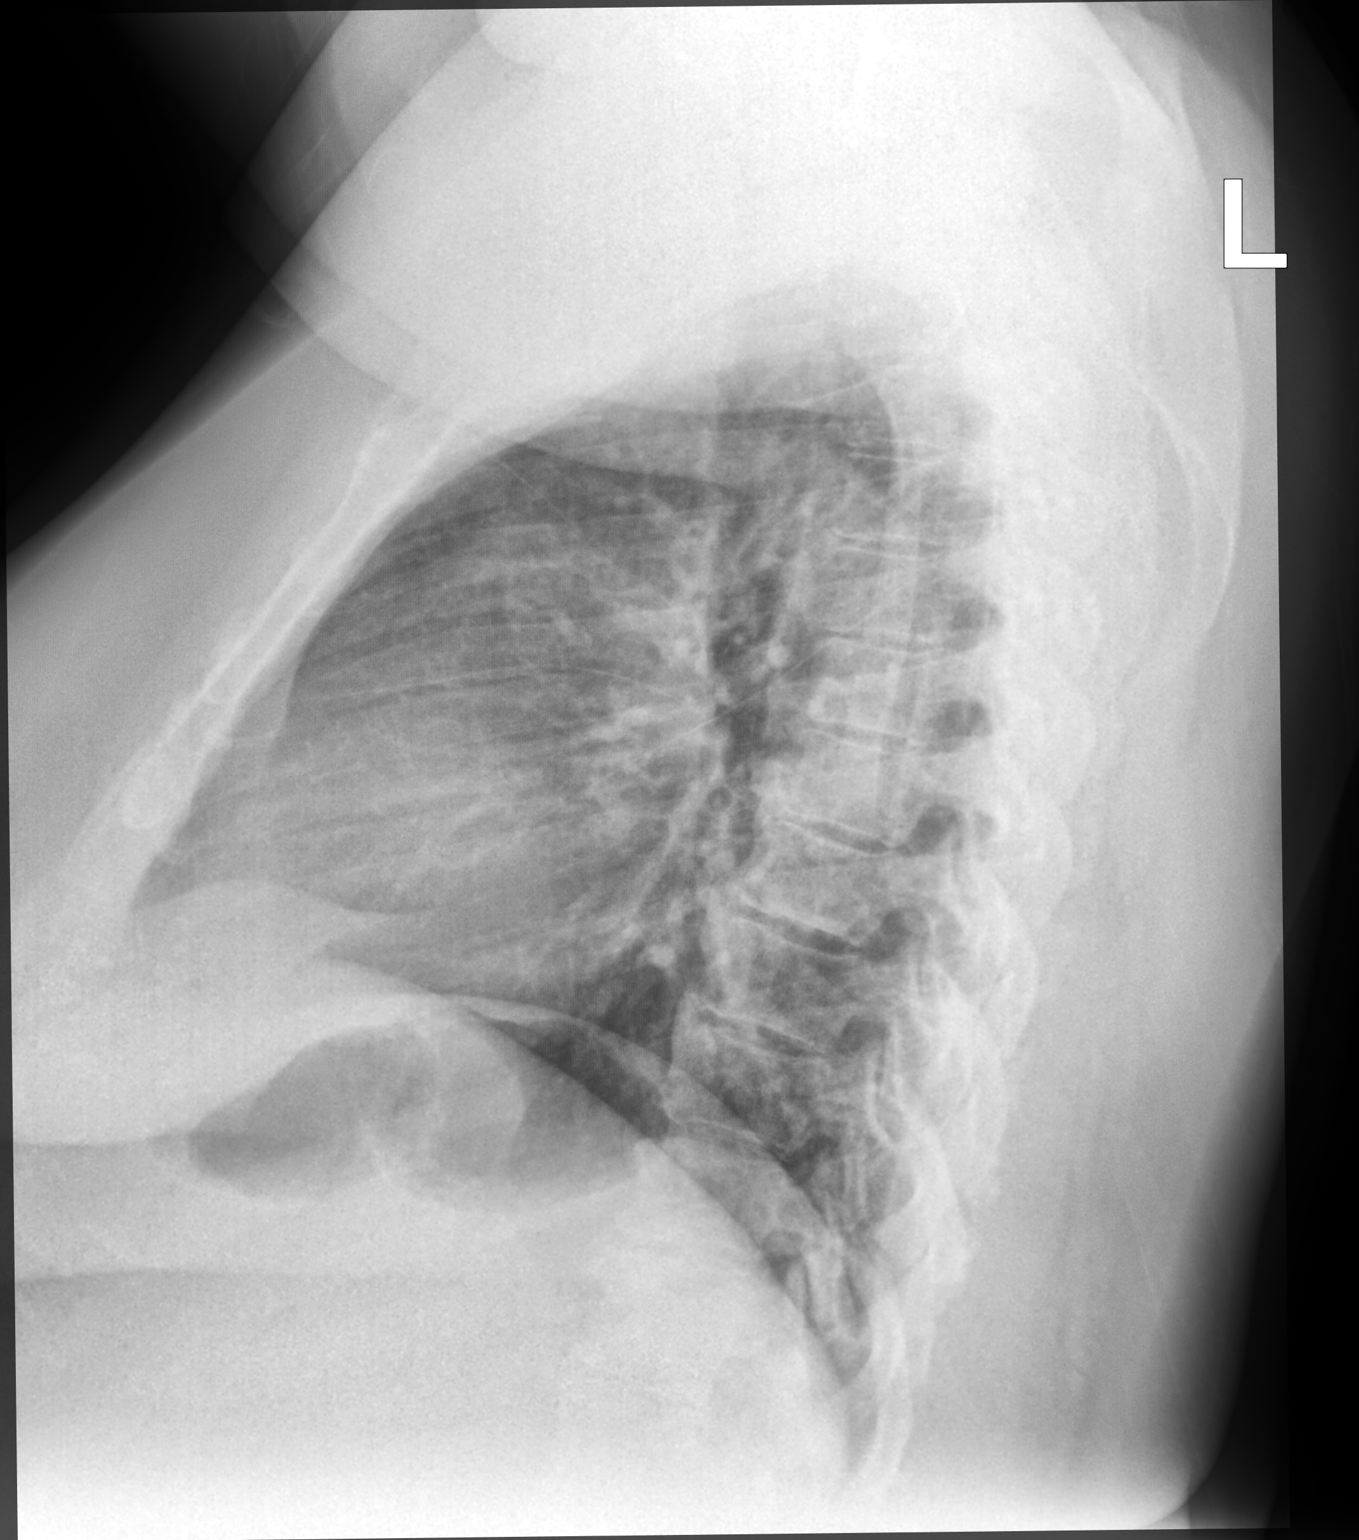

[2 of 2 positions shown; findings below may reference images not displayed]

FINDINGS: Trachea midline. Cardiomediastinal contours and hilar structures are
normal.

Lungs are clear. No sign of pleural effusion. On limited assessment
no acute skeletal process.
IMPRESSION: No acute cardiopulmonary process.

## 2022-07-27 ENCOUNTER — Inpatient Hospital Stay: Payer: 59

## 2022-07-27 ENCOUNTER — Encounter: Payer: Self-pay | Admitting: Nurse Practitioner

## 2022-07-27 ENCOUNTER — Inpatient Hospital Stay: Payer: 59 | Attending: Oncology | Admitting: Nurse Practitioner

## 2022-07-27 VITALS — BP 142/84 | HR 92 | Temp 98.2°F | Resp 18 | Ht 66.0 in | Wt 289.1 lb

## 2022-07-27 DIAGNOSIS — Z7901 Long term (current) use of anticoagulants: Secondary | ICD-10-CM | POA: Diagnosis not present

## 2022-07-27 DIAGNOSIS — Z86718 Personal history of other venous thrombosis and embolism: Secondary | ICD-10-CM | POA: Insufficient documentation

## 2022-07-27 DIAGNOSIS — I82403 Acute embolism and thrombosis of unspecified deep veins of lower extremity, bilateral: Secondary | ICD-10-CM

## 2022-07-27 LAB — PROTIME-INR
INR: 1.6 — ABNORMAL HIGH (ref 0.8–1.2)
Prothrombin Time: 19.2 seconds — ABNORMAL HIGH (ref 11.4–15.2)

## 2022-07-27 NOTE — Progress Notes (Signed)
  [facility] OFFICE PROGRESS NOTE   Diagnosis: Chronic anticoagulation  INTERVAL HISTORY:   Ms. Woolford returns for follow-up.  She continues Coumadin and confirms she is currently taking 10 mg on Monday Wednesday Friday and 7.5 mg all other days.  No missed doses for the past 2 weeks.  She denies bleeding.  No symptom of thrombosis.  Objective:  Vital signs in last 24 hours:  Blood pressure (!) 160/104, pulse 92, temperature 98.2 F (36.8 C), temperature source Oral, resp. rate 18, height 5\' 6"  (1.676 m), weight 289 lb 1.6 oz (131.1 kg), SpO2 96 %.    Resp: Lungs clear bilaterally. Cardio: Regular rate and rhythm. GI: No hepatosplenomegaly. Vascular: No leg edema.  Left lower leg is slightly larger than the right lower leg.  Lab Results:  Lab Results  Component Value Date   WBC 6.3 06/22/2022   HGB 11.6 (L) 06/22/2022   HCT 35.6 (L) 06/22/2022   MCV 82.9 06/22/2022   PLT 349.0 06/22/2022   NEUTROABS 3.7 06/22/2022    Imaging:  No results found.  Medications: I have reviewed the patient's current medications.  Assessment/Plan: History of bilateral lower extremity deep vein thrombosis. She continues Coumadin anticoagulation. History of a positive lupus anticoagulant. History of a positive beta-2-glycoprotein IgA antibody. Chronic bilateral leg pain - negative bilateral Doppler January 2010. History of hypertension. COPD Status post hysterectomy. Remote history of cocaine abuse. History of an adrenal lesion on a CT scan in 2008 - status post a repeat CT on June 16, 2009, showing slow enlargement of the right adrenal nodule since 2006, most consistent with an adenoma. Stable on a CT 04/24/2014 History of rectal and vaginal bleeding in May 2009.   Status post carpal tunnel surgery. 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 Right knee replacement September 2011. Multiple orthopedic conditions. Placement of IVC filter preoperatively before knee replacement.  The IVC filter was retrieved 03/01/2011. Anemia. Ferritin in normal range at 82 on 08/18/2013; 61 on 07/10/2016 Status post lumbar fusion surgery October 2013 Right hydronephrosis-evaluated by Dr. Berneice Heinrich Left ankle swelling/pain 07/24/2016 Mild anemia noted on hospital admission 2018 Colonoscopy 04/19/2017-entire examined colon normal.  Repeat colonoscopy in 10 years for surveillance. Admission with COVID-19 infection 03/09/2019  Disposition: Ms. Varma appears stable.  The PT/INR is in subtherapeutic range.  She will change her Coumadin dose to 7.5 mg Monday Wednesday Friday, 10 mg all other days.  Plan for repeat PT/INR in 2 weeks.  She will be scheduled for a 36-month follow-up visit.    Lonna Cobb ANP/GNP-BC   07/27/2022  9:59 AM

## 2022-07-29 ENCOUNTER — Ambulatory Visit (HOSPITAL_COMMUNITY)
Admission: EM | Admit: 2022-07-29 | Discharge: 2022-07-29 | Disposition: A | Discharge status: Home / Self Care | Payer: 59 | Attending: Emergency Medicine | Admitting: Emergency Medicine

## 2022-07-29 ENCOUNTER — Encounter (HOSPITAL_COMMUNITY): Payer: Self-pay

## 2022-07-29 DIAGNOSIS — S00452A Superficial foreign body of left ear, initial encounter: Secondary | ICD-10-CM | POA: Diagnosis not present

## 2022-07-29 MED ORDER — LIDOCAINE HCL (PF) 2 % IJ SOLN
INTRAMUSCULAR | Status: AC
  Filled 2022-07-29: qty 5

## 2022-07-29 NOTE — Discharge Instructions (Addendum)
We removed an earring back from your earlobe today.  Please refrain from wearing any earrings to this year while it heals.  You can apply topical Neosporin ointment and keep the area clean and dry.  Please return to clinic or your primary care if you develop any signs concerning of infection like swelling, warmth and purulent drainage.

## 2022-07-29 NOTE — ED Provider Notes (Signed)
[facility]    CSN: [id number] Arrival date & time: 07/29/22  1158      History   Chief Complaint Chief Complaint  Patient presents with   Earlobe Pain    HPI Leronda Merchen Tamburro is a 67 y.o. female.   Patient presents to clinic for left earlobe pain and potential foreign body.  She had removed an earring on Thursday evening with a small plastic bag, thought initially she remove the back, however when she had pain and swelling, she noticed that the small plastic bag was retained.  Has never had this issue before.  Attempted to remove it herself, was unsuccessful.  The history is provided by the patient and medical records.    Past Medical History:  Diagnosis Date   Adrenal mass (HCC) 03/226   Benign   Arthritis    knees/multiple orthopedic conditons; lower back   Asthma    per pt   Clotting disorder (HCC)    +beta-2-glycoprotein IgA antibody   COPD (chronic obstructive pulmonary disease) (HCC)    inhalers dependent on environment   Depression    Diabetes mellitus    120s usually fasting -  dx more than 10 yrs ago   Dizziness, nonspecific    DVT (deep venous thrombosis) (HCC)    Recurrent   Fibromyalgia    GERD (gastroesophageal reflux disease)    Heart murmur    History of blood transfusion    History of cocaine abuse (HCC)    Remote history    Hyperlipidemia    Hypertension    takes meds daily   Hypothyroidism    Lupus Anticoagulant Positive    On home oxygen therapy    at night   Shortness of breath    exertion or lying flat   Sleep apnea    2l of oxygen at night (as of 12/6, she used to)    Patient Active Problem List   Diagnosis Date Noted   Mixed hyperlipidemia 10/07/2021   Paroxysmal SVT (supraventricular tachycardia) 10/07/2021   Allergic rhinitis 11/29/2020   Allergic rhinitis due to pollen 11/29/2020   Moderate persistent asthma without complication 11/29/2020   Osteoarthritis of spine with radiculopathy, lumbar region 08/24/2020    Chronic back pain 08/18/2020   Chronic heart failure with preserved ejection fraction (HCC) 02/27/2020   Obesity, diabetes, and hypertension syndrome (HCC) 02/27/2020   Vitreomacular adhesion of both eyes 02/09/2020   Pseudophakia of both eyes 02/09/2020   Chronic diastolic CHF (congestive heart failure) (HCC) 03/12/2019   Hypernatremia 03/09/2019   Suspected COVID-19 virus infection 03/09/2019   Acute encephalopathy 03/09/2019   Right sided weakness 02/20/2019   Thyroid mass 02/27/2017   Chronic pain of right knee 05/24/2016   Effusion, right knee 04/28/2016   Presence of right artificial knee joint 04/28/2016   Abnormal LFTs    Decreased sensation    Paresthesia 06/12/2014   Obstruction of kidney 04/24/2014   Diabetes type 2, uncontrolled 04/24/2014   Chronic anticoagulation 04/24/2014   Crack cocaine use 04/24/2014   Depression 04/24/2014   Fibromyalgia 04/24/2014   OSA (obstructive sleep apnea) 04/24/2014   Lupus anticoagulant positive 04/24/2014   Adrenal mass, right (HCC) 04/24/2014   Right kidney mass 04/24/2014   Hydronephrosis of right kidney 04/24/2014   Supratherapeutic INR 04/24/2014   Renal mass, right 04/24/2014   Diabetes mellitus with coincident hypertension (HCC)    Right lower quadrant abdominal pain    Morbid obesity (HCC) 08/21/2013   Constipation 08/19/2013  Lumbosacral spondylosis without myelopathy 11/19/2012   Bilateral deep vein thromboses (HCC) 12/13/2011   Abdominal pain 11/05/2011   Hypokalemia 11/05/2011   Sciatica 11/05/2011   Diabetes mellitus type 2 in obese (HCC) 11/05/2011   HTN (hypertension) 11/05/2011    Past Surgical History:  Procedure Laterality Date   ABDOMINAL HYSTERECTOMY  1989   Fibroids   ANTERIOR LUMBAR FUSION  01/03/2012   Procedure: ANTERIOR LUMBAR FUSION 1 LEVEL;  Surgeon: Carmela Hurt, MD;  Location: [facility];  Service: Neurosurgery;  Laterality: N/A;  Lumbar Four-Five Anterior Lumbar Interbody Fusion with  Instrumentation   BACK SURGERY     cervical spine---disk disease   CARPAL TUNNEL RELEASE     Bilateral   CESAREAN SECTION     CHOLECYSTECTOMY     CYSTOSCOPY/RETROGRADE/URETEROSCOPY/STONE EXTRACTION WITH BASKET Right 04/26/2014   Procedure: CYSTOSCOPY/ RIGHT RETROGRADE/ RIGHT URETEROSCOPY/URETERAL AND RENAL PELVIS BIOPSY;  Surgeon: Sebastian Ache, MD;  Location: [facility];  Service: Urology;  Laterality: Right;   gallstones removed     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   bilateral   right elbow surgery     RIGHT HEART CATH N/A 02/28/2021   Procedure: RIGHT HEART CATH;  Surgeon: Kathleene Hazel, MD;  Location: [facility];  Service: Cardiovascular;  Laterality: N/A;   THYROID LOBECTOMY Right 02/27/2017   THYROID LOBECTOMY Right 02/27/2017   Procedure: RIGHT THYROID LOBECTOMY;  Surgeon: Harriette Bouillon, MD;  Location: [facility];  Service: General;  Laterality: Right;   TUBAL LIGATION      OB History   No obstetric history on file.      Home Medications    Prior to Admission medications   Medication Sig Start Date End Date Taking? Authorizing Provider  albuterol (PROVENTIL) (2.5 MG/3ML) 0.083% nebulizer solution USE 1 VIAL IN NEBULIZER EVERY 6 HOURS - and as needed 08/11/21  Yes [name], MD  albuterol (VENTOLIN HFA) 108 (90 Base) MCG/ACT inhaler INHALE TWO PUFFS BY MOUTH INTO LUNGS every FOUR hours AS NEEDED FOR SHORTNESS OF BREATH 04/21/22  Yes [name], MD  cloNIDine (CATAPRES) 0.1 MG tablet TAKE ONE TABLET BY MOUTH BEFORE BREAKFAST and TAKE ONE TABLET BY MOUTH EVERYDAY AT BEDTIME 04/14/22  Yes [name], MD  diltiazem (CARDIZEM CD) 240 MG 24 hr capsule TAKE ONE CAPSULE BY MOUTH BEFORE BREAKFAST 11/15/21  Yes [name], MD  DULoxetine (CYMBALTA) 30 MG capsule Take 90 mg by mouth at bedtime.   Yes [provider]  FEROSUL 325 (65 Fe) MG tablet TAKE ONE TABLET BY MOUTH ONCE DAILY 07/19/21  Yes [name], MD  fexofenadine (ALLEGRA) 180 MG  tablet TAKE ONE TABLET BY MOUTH ONCE DAILY 07/24/22  Yes [name], MD  fluticasone (FLONASE) 50 MCG/ACT nasal spray Place 2 sprays into both nostrils 2 (two) times daily. 06/22/22  Yes [name], MD  furosemide (LASIX) 80 MG tablet TAKE ONE TABLET BY MOUTH TWICE DAILY 02/14/22  Yes Chandrasekhar, [name] A, MD  gabapentin (NEURONTIN) 300 MG capsule TAKE ONE CAPSULE BY MOUTH TWICE DAILY 01/13/22  Yes [name], MD  LINZESS 145 MCG CAPS capsule TAKE ONE CAPSULE BY MOUTH every other DAY 02/14/22  Yes [name], MD  metFORMIN (GLUCOPHAGE) 500 MG tablet TAKE ONE TABLET BY MOUTH EVERYDAY AT BEDTIME 11/15/21  Yes [name], MD  omeprazole (PRILOSEC) 20 MG capsule TAKE ONE CAPSULE BY MOUTH BEFORE BREAKFAST and TAKE ONE CAPSULE BY MOUTH EVERY EVENING 06/14/22  Yes [name], MD  potassium chloride (MICRO-K) 10 MEQ CR capsule TAKE TWO CAPSULES BY MOUTH EVERYDAY AT BEDTIME 04/14/22  Yes [name], MD  pravastatin (PRAVACHOL) 40 MG tablet TAKE ONE TABLET BY MOUTH EVERY EVENING 04/14/22  Yes [name], MD  QUEtiapine (SEROQUEL) 50 MG tablet Take 50 mg by mouth 2 (two) times daily.   Yes [provider]  Semaglutide, 1 MG/DOSE, 4 MG/3ML SOPN Inject 1 mg as directed once a week. 04/20/22  Yes [name], MD  spironolactone (ALDACTONE) 25 MG tablet TAKE 1/2 TABLET BY MOUTH ONCE DAILY 01/12/22  Yes Chandrasekhar, [name] A, MD  SYMBICORT 160-4.5 MCG/ACT inhaler INHALE TWO PUFFS BY MOUTH INTO LUNGS twice daily 04/14/22  Yes [name], MD  traMADol (ULTRAM) 50 MG tablet Take 1 tablet (50 mg total) by mouth every 12 (twelve) hours as needed. 04/20/22  Yes [name], MD  Vitamin D, Ergocalciferol, (DRISDOL) 1.25 MG (50000 UNIT) CAPS capsule Take 1 capsule (50,000 Units total) by mouth every 7 (seven) days. 07/21/22  Yes [name], MD  warfarin (COUMADIN) 10 MG tablet TAKE ONE TABLET BY MOUTH daily EXCEPT ON Monday, Wednesday, AND Friday OR AS  DIRECTED 06/15/22  Yes Ladene Artist, MD  warfarin (COUMADIN) 7.5 MG tablet TAKE ONE TABLET BY MOUTH ON Monday, Wednesdays, AND Fridays 05/15/22  Yes Ladene Artist, MD  zonisamide (ZONEGRAN) 100 MG capsule Take by mouth. 11/24/21  Yes [provider]  zonisamide (ZONEGRAN) 50 MG capsule 1 TABLET AM AND 2 TABLETS PM   Yes [provider]  ACCU-CHEK GUIDE test strip USE TO check blood glucose UP TO four times daily AS DIRECTED 04/14/22   [name], MD  Accu-Chek Softclix Lancets lancets USE TO check blood glucose UP TO four times daily AS DIRECTED 07/24/22   [name], MD  blood glucose meter kit and supplies KIT Dispense based on patient and insurance preference. Use up to four times daily as directed. (FOR ICD-9 250.00, 250.01). 07/16/19   [name], MD  diclofenac sodium (VOLTAREN) 1 % GEL Apply 2 g topically daily as needed (fibromyalgia pain).    [provider]  Incontinence Supply Disposable (PROCARE BARIATRIC BRIEFS) MISC Use as needed. 04/20/22   [name], MD  INVOKANA 100 MG TABS tablet TAKE ONE TABLET BY MOUTH BEFORE BREAKFAST 11/15/21   Chandrasekhar, [name] A, MD  lidocaine (LIDODERM) 5 % Place 1 patch onto the skin daily. Remove & Discard patch within 12 hours or as directed by MD 03/22/22   Blue, Soijett A, PA-C  magnesium oxide (MAG-OX) 400 MG tablet Take 1 tablet by mouth 2 (two) times daily. 07/24/22   [provider]  methocarbamol (ROBAXIN) 500 MG tablet Take 1 tablet (500 mg total) by mouth 2 (two) times daily. 03/22/22   Blue, Soijett A, PA-C    Family History Family History  Problem Relation Age of Onset   Hypertension Mother    Diabetes Mother    Cancer Mother        Pancreatic   Hypertension Father    Heart failure Father    Hyperlipidemia Other    COPD Other     Social History Social History   Tobacco Use   Smoking status: Never   Smokeless tobacco: Never  Vaping Use   Vaping Use: Never used  Substance  Use Topics   Alcohol use: No    Alcohol/week: 0.0 standard drinks of alcohol  Comment: beer in past    Drug use: Yes    Types: Cocaine    Comment: quit 2003-rehab progam in 2005     Allergies   Lisinopril and Corticosteroids   Review of Systems Review of Systems  Constitutional:  Negative for fatigue and fever.  Skin:  Positive for wound.     Physical Exam Triage Vital Signs ED Triage Vitals  Enc Vitals Group     BP 07/29/22 1234 (!) 143/79     Pulse Rate 07/29/22 1234 80     Resp 07/29/22 1234 16     Temp 07/29/22 1234 98.9 F (37.2 C)     Temp Source 07/29/22 1234 Oral     SpO2 07/29/22 1234 93 %     Weight 07/29/22 1234 282 lb (127.9 kg)     Height 07/29/22 1234 5\' 6"  (1.676 m)     Head Circumference --      Peak Flow --      Pain Score 07/29/22 1233 8     Pain Loc --      Pain Edu? --      Excl. in GC? --    No data found.  Updated Vital Signs BP (!) 143/79 (BP Location: Right Arm)   Pulse 80   Temp 98.9 F (37.2 C) (Oral)   Resp 16   Ht 5\' 6"  (1.676 m)   Wt 282 lb (127.9 kg)   SpO2 93%   BMI 45.52 kg/m   Visual Acuity Right Eye Distance:   Left Eye Distance:   Bilateral Distance:    Right Eye Near:   Left Eye Near:    Bilateral Near:     Physical Exam Vitals and nursing note reviewed.  Constitutional:      Appearance: Normal appearance.  HENT:     Head: Normocephalic and atraumatic.     Nose: Nose normal.     Mouth/Throat:     Mouth: Mucous membranes are moist.  Eyes:     Conjunctiva/sclera: Conjunctivae normal.  Cardiovascular:     Rate and Rhythm: Normal rate.  Pulmonary:     Effort: Pulmonary effort is normal. No respiratory distress.  Neurological:     General: No focal deficit present.     Mental Status: She is alert and oriented to person, place, and time.  Psychiatric:        Mood and Affect: Mood normal.        Behavior: Behavior normal. Behavior is cooperative.      UC Treatments / Results  Labs (all labs  ordered are listed, but only abnormal results are displayed) Labs Reviewed - No data to display  EKG   Radiology No results found.  Procedures Foreign Body Removal  Date/Time: 07/29/2022 1:10 PM  Performed by: Marjorie Deprey, [place] N, FNP Authorized by: Jesiel Garate, [place] N, FNP   Consent:    Consent obtained:  Verbal   Consent given by:  Patient   Risks, benefits, and alternatives were discussed: yes     Risks discussed:  Bleeding, infection and pain   Alternatives discussed:  No treatment and delayed treatment Universal protocol:    Immediately prior to procedure, a time out was called: yes     Patient identity confirmed:  Verbally with patient Location:    Location:  Ear   Ear location:  L ear Pre-procedure details:    Imaging:  None Anesthesia:    Anesthesia method:  Local infiltration   Local anesthetic:  Lidocaine 2% w/o epi  Procedure type:    Procedure complexity:  Simple Procedure details:    Incision length:  1 cm   Localization method:  Probed   Dissection of underlying tissues: yes     Removal mechanism:  Hemostat   Foreign bodies recovered:  1   Description:  Plastic earring back Post-procedure details:    Neurovascular status: intact     Confirmation:  No additional foreign bodies on visualization   Skin closure:  None   Dressing:  Antibiotic ointment   Procedure completion:  Tolerated  (including critical care time)  Medications Ordered in UC Medications - No data to display  Initial Impression / Assessment and Plan / UC Course  I have reviewed the triage vital signs and the nursing notes.  Pertinent labs & imaging results that were available during my care of the patient were reviewed by me and considered in my medical decision making (see chart for details).  Vitals and triage reviewed, patient is hemodynamically stable.  Left lower earlobe with retained plastic earring back.  Area cleaned, numbed with 2% lidocaine without epi, small incision made  with scalpel, hemostats used to probe the piercing and remove plastic your back.  Antibacterial ointment and Band-Aid applied.  Wound care discussed.  Plan of care, return precautions, and follow-up care discussed, no questions at this time.     Final Clinical Impressions(s) / UC Diagnoses   Final diagnoses:  Embedded earring of left ear, initial encounter     Discharge Instructions      We removed an earring back from your earlobe today.  Please refrain from wearing any earrings to this year while it heals.  You can apply topical Neosporin ointment and keep the area clean and dry.  Please return to clinic or your primary care if you develop any signs concerning of infection like swelling, warmth and purulent drainage.    ED Prescriptions   None    PDMP not reviewed this encounter.   Shresta Risden, [place] N, [place] 07/29/22 1312

## 2022-07-29 NOTE — ED Triage Notes (Signed)
Patient here today with c/o pain and swelling in her left earlobe due to a plastic piece stuck in earlobe from back of earring on Thursday evening.

## 2022-07-31 ENCOUNTER — Ambulatory Visit
Admission: RE | Admit: 2022-07-31 | Discharge: 2022-07-31 | Disposition: A | Discharge status: Home / Self Care | Payer: 59 | Source: Ambulatory Visit | Attending: Family Medicine | Admitting: Family Medicine

## 2022-07-31 DIAGNOSIS — H539 Unspecified visual disturbance: Secondary | ICD-10-CM

## 2022-07-31 DIAGNOSIS — R202 Paresthesia of skin: Secondary | ICD-10-CM

## 2022-07-31 DIAGNOSIS — I6782 Cerebral ischemia: Secondary | ICD-10-CM | POA: Diagnosis not present

## 2022-07-31 DIAGNOSIS — G459 Transient cerebral ischemic attack, unspecified: Secondary | ICD-10-CM

## 2022-08-02 ENCOUNTER — Other Ambulatory Visit: Payer: Self-pay | Admitting: *Deleted

## 2022-08-02 DIAGNOSIS — Z7901 Long term (current) use of anticoagulants: Secondary | ICD-10-CM

## 2022-08-02 DIAGNOSIS — I82403 Acute embolism and thrombosis of unspecified deep veins of lower extremity, bilateral: Secondary | ICD-10-CM

## 2022-08-03 ENCOUNTER — Encounter: Payer: Self-pay | Admitting: Family Medicine

## 2022-08-10 ENCOUNTER — Telehealth: Payer: Self-pay

## 2022-08-10 ENCOUNTER — Inpatient Hospital Stay: Payer: 59

## 2022-08-10 ENCOUNTER — Other Ambulatory Visit: Payer: Self-pay

## 2022-08-10 DIAGNOSIS — I82403 Acute embolism and thrombosis of unspecified deep veins of lower extremity, bilateral: Secondary | ICD-10-CM

## 2022-08-10 DIAGNOSIS — Z86718 Personal history of other venous thrombosis and embolism: Secondary | ICD-10-CM | POA: Diagnosis not present

## 2022-08-10 DIAGNOSIS — Z7901 Long term (current) use of anticoagulants: Secondary | ICD-10-CM | POA: Diagnosis not present

## 2022-08-10 LAB — PROTIME-INR
INR: 1.8 — ABNORMAL HIGH (ref 0.8–1.2)
Prothrombin Time: 21.1 seconds — ABNORMAL HIGH (ref 11.4–15.2)

## 2022-08-10 NOTE — Telephone Encounter (Signed)
Mrs. [name] states she is taking her Coumadin 7.5 mg Mon, Wed, Fri and other days 10mg 

## 2022-08-10 NOTE — Telephone Encounter (Signed)
-----   Message from Rana Snare, NP sent at 08/10/2022  9:45 AM EDT ----- Please instruct her to continue the current dose of Coumadin (should be 7.5 mg Monday Wednesday Friday, 10 mg all other days, please confirm this is how she has been taking it).  Check PT/INR in 2 weeks.

## 2022-08-11 ENCOUNTER — Telehealth: Payer: Self-pay

## 2022-08-11 NOTE — Progress Notes (Unsigned)
Care Management & Coordination Services Pharmacy Team  Reason for Encounter: Medication coordination and delivery  Contacted patient to discuss medications and coordinate delivery from [facility]. {US HC Outreach:28874}  Cycle dispensing form sent to *** for review.   Last adherence delivery date:07/26/2022      Patient is due for next adherence delivery on: 08/24/2022  This delivery to include: Adherence Packaging  30 Days  Zonisamide 100 mg  - take 1 capsule at breakfast and 2 capsules at dinner Gabapentin 300 mg - take 1 capsule with breakfast and dinner Symbicort 160/4.5 - use 2 puff twice daily Spironolactone 25 mg - take 1/2 tablet at breakfast Furosemide 80 mg - take 1 tablet at breakfast and dinner Pravastatin 40 mg  - take 1 tablet at dinner Cartia XT 240 mg - take 1 capsule before breakfast Invokana 100 mg - take 1 tablet before breakfast Potassium CL ER -  take 2 capsules at bedtime Warfarin 10 mg - take 1 tablet daily or as directed Metformin 500 mg - take 1 tablet at bedtime Fexofenadine 180 mg once daily at breakfast  Duloxetine 30 mg  - take 3 capsules at bedtime  Quetiapine 50 mg - take 1 tablet at breakfast and bedtime  Clonidine 0.1 mg - take 1 tablet before breakfast and bedtime Accu-chek test strips Accu-chek softclix lancets Albuterol HFA 2 puffs every 4 hours as needed Linzess 145 mcg - Take one capsule every other day Omeprazole - 20 mg - take 1 capsule before breakfast and dinner Warfarin 7.5 mg - take 1 tablet Mon-Wed-Fri or as directed Ozempic 4mg /71ml - 1 mg weekly Vitamin D2 50,000 un - 1 capsule weekly  Patient declined the following medications this month: Fluticasone spray - 2 sprays twice daily    {Delivery date:25786}  Any concerns about your medications? Patient denies   How often do you forget or accidentally miss a dose? {Missed doses:25554}  Is patient in packaging Yes  If yes  What is the date on your next pill  pack?  Any concerns or issues with your packaging?   What diet changes have been made to improve diabetes control?  What recent interventions/DTPs have been made to improve glycemic control:  No recent interventions.  Have there been any recent hospitalizations or ED visits since last visit with PharmD? Patient was seen at [facility] on 07/29/2022 (1 hour) due to Embedded earring of left ear, initial encounter   Patient {reports/denies:24182} hypoglycemic symptoms, including {Hypoglycemic Symptoms:3049003}  Patient {reports/denies:24182} hyperglycemic symptoms, including {symptoms; hyperglycemia:17903}  How often are you checking your blood sugar? {BG Testing frequency:23922}  Recent blood glucose readings are as follows:***  During the week, how often does your blood glucose drop below 70? {LowBGfrequency:24142}  Chart review: Recent office visits:  None  Recent consult visits:  07/27/2022 Lonna Cobb NP (hem/onc) - Patient was see for chronic anticoagulation and an additional concern. No medication changes.   07/17/2022 Charles Magnant PA-C (ortho) - Patient was seen for pain in left hand. No medication changes.   Hospital visits:  Patient was seen at [name] on 07/29/2022 (1 hour) due to Embedded earring of left ear, initial encounter .   New?Medications Started at [facility] Discharge:?? None Medication Changes at Hospital Discharge: None Medications Discontinued at Hospital Discharge: None Medications that remain the same after Hospital Discharge:??  -All other medications will remain the same.    Medications: Outpatient Encounter Medications as of 08/11/2022  Medication Sig  ACCU-CHEK GUIDE test strip USE TO check blood glucose UP TO four times daily AS DIRECTED   Accu-Chek Softclix Lancets lancets USE TO check blood glucose UP TO four times daily AS DIRECTED   albuterol (PROVENTIL) (2.5 MG/3ML) 0.083% nebulizer solution USE 1 VIAL IN  NEBULIZER EVERY 6 HOURS - and as needed   albuterol (VENTOLIN HFA) 108 (90 Base) MCG/ACT inhaler INHALE TWO PUFFS BY MOUTH INTO LUNGS every FOUR hours AS NEEDED FOR SHORTNESS OF BREATH   blood glucose meter kit and supplies KIT Dispense based on patient and insurance preference. Use up to four times daily as directed. (FOR ICD-9 250.00, 250.01).   cloNIDine (CATAPRES) 0.1 MG tablet TAKE ONE TABLET BY MOUTH BEFORE BREAKFAST and TAKE ONE TABLET BY MOUTH EVERYDAY AT BEDTIME   diclofenac sodium (VOLTAREN) 1 % GEL Apply 2 g topically daily as needed (fibromyalgia pain).   diltiazem (CARDIZEM CD) 240 MG 24 hr capsule TAKE ONE CAPSULE BY MOUTH BEFORE BREAKFAST   DULoxetine (CYMBALTA) 30 MG capsule Take 90 mg by mouth at bedtime.   FEROSUL 325 (65 Fe) MG tablet TAKE ONE TABLET BY MOUTH ONCE DAILY   fexofenadine (ALLEGRA) 180 MG tablet TAKE ONE TABLET BY MOUTH ONCE DAILY   fluticasone (FLONASE) 50 MCG/ACT nasal spray Place 2 sprays into both nostrils 2 (two) times daily.   furosemide (LASIX) 80 MG tablet TAKE ONE TABLET BY MOUTH TWICE DAILY   gabapentin (NEURONTIN) 300 MG capsule TAKE ONE CAPSULE BY MOUTH TWICE DAILY   Incontinence Supply Disposable (PROCARE BARIATRIC BRIEFS) MISC Use as needed.   INVOKANA 100 MG TABS tablet TAKE ONE TABLET BY MOUTH BEFORE BREAKFAST   lidocaine (LIDODERM) 5 % Place 1 patch onto the skin daily. Remove & Discard patch within 12 hours or as directed by MD   LINZESS 145 MCG CAPS capsule TAKE ONE CAPSULE BY MOUTH every other DAY   magnesium oxide (MAG-OX) 400 MG tablet Take 1 tablet by mouth 2 (two) times daily.   metFORMIN (GLUCOPHAGE) 500 MG tablet TAKE ONE TABLET BY MOUTH EVERYDAY AT BEDTIME   methocarbamol (ROBAXIN) 500 MG tablet Take 1 tablet (500 mg total) by mouth 2 (two) times daily.   omeprazole (PRILOSEC) 20 MG capsule TAKE ONE CAPSULE BY MOUTH BEFORE BREAKFAST and TAKE ONE CAPSULE BY MOUTH EVERY EVENING   potassium chloride (MICRO-K) 10 MEQ CR capsule TAKE TWO  CAPSULES BY MOUTH EVERYDAY AT BEDTIME   pravastatin (PRAVACHOL) 40 MG tablet TAKE ONE TABLET BY MOUTH EVERY EVENING   QUEtiapine (SEROQUEL) 50 MG tablet Take 50 mg by mouth 2 (two) times daily.   Semaglutide, 1 MG/DOSE, 4 MG/3ML SOPN Inject 1 mg as directed once a week.   spironolactone (ALDACTONE) 25 MG tablet TAKE 1/2 TABLET BY MOUTH ONCE DAILY   SYMBICORT 160-4.5 MCG/ACT inhaler INHALE TWO PUFFS BY MOUTH INTO LUNGS twice daily   traMADol (ULTRAM) 50 MG tablet Take 1 tablet (50 mg total) by mouth every 12 (twelve) hours as needed.   Vitamin D, Ergocalciferol, (DRISDOL) 1.25 MG (50000 UNIT) CAPS capsule Take 1 capsule (50,000 Units total) by mouth every 7 (seven) days.   warfarin (COUMADIN) 10 MG tablet TAKE ONE TABLET BY MOUTH daily EXCEPT ON Monday, Wednesday, AND Friday OR AS DIRECTED   warfarin (COUMADIN) 7.5 MG tablet TAKE ONE TABLET BY MOUTH ON Monday, Wednesdays, AND Fridays   zonisamide (ZONEGRAN) 100 MG capsule Take by mouth.   zonisamide (ZONEGRAN) 50 MG capsule 1 TABLET AM AND 2 TABLETS PM   Facility-Administered Encounter  Medications as of 08/11/2022  Medication   sodium chloride flush (NS) 0.9 % injection 3 mL   BP Readings from Last 3 Encounters:  07/29/22 (!) 143/79  07/27/22 (!) 142/84  07/19/22 (!) 144/96    Pulse Readings from Last 3 Encounters:  07/29/22 80  07/27/22 92  07/19/22 88    Lab Results  Component Value Date/Time   HGBA1C 7.2 (H) 06/22/2022 10:54 AM   HGBA1C 7.0 (A) 04/20/2022 08:33 AM   HGBA1C 9.5 (H) 01/02/2022 02:50 PM   Lab Results  Component Value Date   CREATININE 0.73 06/22/2022   BUN 12 06/22/2022   GFR 85.32 06/22/2022   GFRNONAA >60 02/18/2021   GFRAA 94 03/05/2020   NA 139 06/22/2022   K 3.6 06/22/2022   CALCIUM 8.6 07/12/2022   CO2 28 06/22/2022   Inetta Fermo CMA  Clinical Pharmacist Assistant [phone]

## 2022-08-16 ENCOUNTER — Ambulatory Visit (INDEPENDENT_AMBULATORY_CARE_PROVIDER_SITE_OTHER): Payer: 59 | Admitting: Orthopedic Surgery

## 2022-08-16 ENCOUNTER — Encounter: Payer: Self-pay | Admitting: Orthopedic Surgery

## 2022-08-16 DIAGNOSIS — M67442 Ganglion, left hand: Secondary | ICD-10-CM | POA: Diagnosis not present

## 2022-08-16 DIAGNOSIS — M65332 Trigger finger, left middle finger: Secondary | ICD-10-CM

## 2022-08-16 NOTE — Progress Notes (Signed)
Office Visit Note   Patient: [name]           Date of Birth: Apr 15, 1955           MRN: [id number] Visit Date: 08/16/2022 Requested by: [name], MD [address] [place],  [place] [address] PCP: [name], MD  Subjective: Chief Complaint  Patient presents with   Left Hand - Follow-up    HPI: [name] is a 67 y.o. female who presents to the office reporting long history of left hand pain.  Was last seen 4 weeks ago.  Has pain and locking at the base of the left hand middle finger A1 pulley region.  Tried Voltaren gel without relief.  Patient is on Coumadin and cannot take anti-inflammatories..                ROS: All systems reviewed are negative as they relate to the chief complaint within the history of present illness.  Patient denies fevers or chills.  Assessment & Plan: Visit Diagnoses:  1. Trigger finger, left middle finger   2. Ganglion cyst of finger of left hand     Plan: Impression is A1 pulley and ganglion cyst from the A1 pulley which is becoming symptomatic.  Discussed multiple options with [facility] including aspiration and injection versus surgical release.  Her preference is for surgical release.  She has tried injections in the past and other digits with varying degrees of success.  This is bothering her enough that she would like to go ahead and get it resolved.  Plan at this time is for trigger finger release with cyst decompression of the A1 pulley left middle finger.  Risk and benefits are discussed including not limited to infection incomplete pain relief as well as delayed healing of the incision.  Patient understands risk benefits and wishes to proceed.  All questions answered  Follow-Up Instructions: No follow-ups on file.   Orders:  No orders of the defined types were placed in this encounter.  No orders of the defined types were placed in this encounter.     Procedures: No procedures performed   Clinical  Data: No additional findings.  Objective: Vital Signs: There were no vitals taken for this visit.  Physical Exam:  Constitutional: Patient appears well-developed HEENT:  Head: Normocephalic Eyes:EOM are normal Neck: Normal range of motion Cardiovascular: Normal rate Pulmonary/chest: Effort normal Neurologic: Patient is alert Skin: Skin is warm Psychiatric: Patient has normal mood and affect  Ortho Exam: Ortho exam demonstrates tenderness as well as cyst formation at the middle finger A1 pulley.  She does have crepitus with finger range of motion at this region.  PIP and DIP motion is intact.  Specialty Comments:  No specialty comments available.  Imaging: No results found.   PMFS History: Patient Active Problem List   Diagnosis Date Noted   Mixed hyperlipidemia 10/07/2021   Paroxysmal SVT (supraventricular tachycardia) 10/07/2021   Allergic rhinitis 11/29/2020   Allergic rhinitis due to pollen 11/29/2020   Moderate persistent asthma without complication 11/29/2020   Osteoarthritis of spine with radiculopathy, lumbar region 08/24/2020   Chronic back pain 08/18/2020   Chronic heart failure with preserved ejection fraction (HCC) 02/27/2020   Obesity, diabetes, and hypertension syndrome (HCC) 02/27/2020   Vitreomacular adhesion of both eyes 02/09/2020   Pseudophakia of both eyes 02/09/2020   Chronic diastolic CHF (congestive heart failure) (HCC) 03/12/2019   Hypernatremia 03/09/2019   Suspected COVID-19 virus infection 03/09/2019  Acute encephalopathy 03/09/2019   Right sided weakness 02/20/2019   Thyroid mass 02/27/2017   Chronic pain of right knee 05/24/2016   Effusion, right knee 04/28/2016   Presence of right artificial knee joint 04/28/2016   Abnormal LFTs    Decreased sensation    Paresthesia 06/12/2014   Obstruction of kidney 04/24/2014   Diabetes type 2, uncontrolled 04/24/2014   Chronic anticoagulation 04/24/2014   Crack cocaine use 04/24/2014    Depression 04/24/2014   Fibromyalgia 04/24/2014   OSA (obstructive sleep apnea) 04/24/2014   Lupus anticoagulant positive 04/24/2014   Adrenal mass, right (HCC) 04/24/2014   Right kidney mass 04/24/2014   Hydronephrosis of right kidney 04/24/2014   Supratherapeutic INR 04/24/2014   Renal mass, right 04/24/2014   Diabetes mellitus with coincident hypertension (HCC)    Right lower quadrant abdominal pain    Morbid obesity (HCC) 08/21/2013   Constipation 08/19/2013   Lumbosacral spondylosis without myelopathy 11/19/2012   Bilateral deep vein thromboses (HCC) 12/13/2011   Abdominal pain 11/05/2011   Hypokalemia 11/05/2011   Sciatica 11/05/2011   Diabetes mellitus type 2 in obese (HCC) 11/05/2011   HTN (hypertension) 11/05/2011   Past Medical History:  Diagnosis Date   Adrenal mass (HCC) 03/226   Benign   Arthritis    knees/multiple orthopedic conditons; lower back   Asthma    per pt   Clotting disorder (HCC)    +beta-2-glycoprotein IgA antibody   COPD (chronic obstructive pulmonary disease) (HCC)    inhalers dependent on environment   Depression    Diabetes mellitus    120s usually fasting -  dx more than 10 yrs ago   Dizziness, nonspecific    DVT (deep venous thrombosis) (HCC)    Recurrent   Fibromyalgia    GERD (gastroesophageal reflux disease)    Heart murmur    History of blood transfusion    History of cocaine abuse (HCC)    Remote history    Hyperlipidemia    Hypertension    takes meds daily   Hypothyroidism    Lupus Anticoagulant Positive    On home oxygen therapy    at night   Shortness of breath    exertion or lying flat   Sleep apnea    2l of oxygen at night (as of 12/6, she used to)    Family History  Problem Relation Age of Onset   Hypertension Mother    Diabetes Mother    Cancer Mother        Pancreatic   Hypertension Father    Heart failure Father    Hyperlipidemia Other    COPD Other     Past Surgical History:  Procedure Laterality Date    ABDOMINAL HYSTERECTOMY  1989   Fibroids   ANTERIOR LUMBAR FUSION  01/03/2012   Procedure: ANTERIOR LUMBAR FUSION 1 LEVEL;  Surgeon: Carmela Hurt, MD;  Location: [facility];  Service: Neurosurgery;  Laterality: N/A;  Lumbar Four-Five Anterior Lumbar Interbody Fusion with Instrumentation   BACK SURGERY     cervical spine---disk disease   CARPAL TUNNEL RELEASE     Bilateral   CESAREAN SECTION     CHOLECYSTECTOMY     CYSTOSCOPY/RETROGRADE/URETEROSCOPY/STONE EXTRACTION WITH BASKET Right 04/26/2014   Procedure: CYSTOSCOPY/ RIGHT RETROGRADE/ RIGHT URETEROSCOPY/URETERAL AND RENAL PELVIS BIOPSY;  Surgeon: Sebastian Ache, MD;  Location: [facility];  Service: Urology;  Laterality: Right;   gallstones removed     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   bilateral   right elbow  surgery     RIGHT HEART CATH N/A 02/28/2021   Procedure: RIGHT HEART CATH;  Surgeon: Kathleene Hazel, MD;  [facility] INVASIVE CV LAB;  Service: Cardiovascular;  Laterality: N/A;   THYROID LOBECTOMY Right 02/27/2017   THYROID LOBECTOMY Right 02/27/2017   Procedure: RIGHT THYROID LOBECTOMY;  Surgeon: Harriette Bouillon, MD;  Location: [facility] OR;  Service: General;  Laterality: Right;   TUBAL LIGATION     Social History   Occupational History   Not on file  Tobacco Use   Smoking status: Never   Smokeless tobacco: Never  Vaping Use   Vaping Use: Never used  Substance and Sexual Activity   Alcohol use: No    Alcohol/week: 0.0 standard drinks of alcohol    Comment: beer in past    Drug use: Yes    Types: Cocaine    Comment: quit 2003-rehab progam in 2005   Sexual activity: Never

## 2022-08-23 ENCOUNTER — Ambulatory Visit: Payer: 59 | Admitting: Orthopedic Surgery

## 2022-08-23 ENCOUNTER — Other Ambulatory Visit: Payer: Self-pay

## 2022-08-23 ENCOUNTER — Encounter (HOSPITAL_COMMUNITY): Payer: Self-pay | Admitting: Internal Medicine

## 2022-08-23 ENCOUNTER — Inpatient Hospital Stay: Payer: 59

## 2022-08-23 ENCOUNTER — Inpatient Hospital Stay (HOSPITAL_COMMUNITY)
Admission: EM | Admit: 2022-08-23 | Discharge: 2022-09-01 | DRG: 377 | Disposition: A | Discharge status: Skilled Nursing Facility | Payer: 59 | Attending: Internal Medicine | Admitting: Internal Medicine

## 2022-08-23 ENCOUNTER — Telehealth: Payer: Self-pay | Admitting: Oncology

## 2022-08-23 ENCOUNTER — Emergency Department (HOSPITAL_COMMUNITY): Payer: 59

## 2022-08-23 DIAGNOSIS — E782 Mixed hyperlipidemia: Secondary | ICD-10-CM | POA: Diagnosis present

## 2022-08-23 DIAGNOSIS — T8484XA Pain due to internal orthopedic prosthetic devices, implants and grafts, initial encounter: Secondary | ICD-10-CM | POA: Diagnosis present

## 2022-08-23 DIAGNOSIS — K921 Melena: Secondary | ICD-10-CM | POA: Diagnosis not present

## 2022-08-23 DIAGNOSIS — Z825 Family history of asthma and other chronic lower respiratory diseases: Secondary | ICD-10-CM

## 2022-08-23 DIAGNOSIS — K31811 Angiodysplasia of stomach and duodenum with bleeding: Principal | ICD-10-CM | POA: Diagnosis present

## 2022-08-23 DIAGNOSIS — K922 Gastrointestinal hemorrhage, unspecified: Principal | ICD-10-CM

## 2022-08-23 DIAGNOSIS — I509 Heart failure, unspecified: Secondary | ICD-10-CM | POA: Diagnosis not present

## 2022-08-23 DIAGNOSIS — Z743 Need for continuous supervision: Secondary | ICD-10-CM | POA: Diagnosis not present

## 2022-08-23 DIAGNOSIS — R55 Syncope and collapse: Secondary | ICD-10-CM | POA: Diagnosis not present

## 2022-08-23 DIAGNOSIS — Z86718 Personal history of other venous thrombosis and embolism: Secondary | ICD-10-CM | POA: Diagnosis not present

## 2022-08-23 DIAGNOSIS — E1142 Type 2 diabetes mellitus with diabetic polyneuropathy: Secondary | ICD-10-CM | POA: Diagnosis present

## 2022-08-23 DIAGNOSIS — K5904 Chronic idiopathic constipation: Secondary | ICD-10-CM | POA: Diagnosis not present

## 2022-08-23 DIAGNOSIS — E876 Hypokalemia: Secondary | ICD-10-CM | POA: Diagnosis not present

## 2022-08-23 DIAGNOSIS — Z7985 Long-term (current) use of injectable non-insulin antidiabetic drugs: Secondary | ICD-10-CM

## 2022-08-23 DIAGNOSIS — K5909 Other constipation: Secondary | ICD-10-CM | POA: Diagnosis not present

## 2022-08-23 DIAGNOSIS — G4733 Obstructive sleep apnea (adult) (pediatric): Secondary | ICD-10-CM | POA: Diagnosis not present

## 2022-08-23 DIAGNOSIS — I10 Essential (primary) hypertension: Secondary | ICD-10-CM | POA: Diagnosis present

## 2022-08-23 DIAGNOSIS — K317 Polyp of stomach and duodenum: Secondary | ICD-10-CM | POA: Diagnosis present

## 2022-08-23 DIAGNOSIS — R791 Abnormal coagulation profile: Secondary | ICD-10-CM | POA: Diagnosis not present

## 2022-08-23 DIAGNOSIS — Z981 Arthrodesis status: Secondary | ICD-10-CM

## 2022-08-23 DIAGNOSIS — D62 Acute posthemorrhagic anemia: Secondary | ICD-10-CM | POA: Diagnosis present

## 2022-08-23 DIAGNOSIS — Z96652 Presence of left artificial knee joint: Secondary | ICD-10-CM | POA: Diagnosis not present

## 2022-08-23 DIAGNOSIS — D6862 Lupus anticoagulant syndrome: Secondary | ICD-10-CM | POA: Diagnosis not present

## 2022-08-23 DIAGNOSIS — M25562 Pain in left knee: Secondary | ICD-10-CM | POA: Diagnosis present

## 2022-08-23 DIAGNOSIS — Z7984 Long term (current) use of oral hypoglycemic drugs: Secondary | ICD-10-CM | POA: Diagnosis not present

## 2022-08-23 DIAGNOSIS — Z8249 Family history of ischemic heart disease and other diseases of the circulatory system: Secondary | ICD-10-CM

## 2022-08-23 DIAGNOSIS — R531 Weakness: Secondary | ICD-10-CM | POA: Diagnosis not present

## 2022-08-23 DIAGNOSIS — I5032 Chronic diastolic (congestive) heart failure: Secondary | ICD-10-CM | POA: Diagnosis present

## 2022-08-23 DIAGNOSIS — K760 Fatty (change of) liver, not elsewhere classified: Secondary | ICD-10-CM | POA: Diagnosis not present

## 2022-08-23 DIAGNOSIS — G8929 Other chronic pain: Secondary | ICD-10-CM | POA: Diagnosis present

## 2022-08-23 DIAGNOSIS — Z79899 Other long term (current) drug therapy: Secondary | ICD-10-CM

## 2022-08-23 DIAGNOSIS — Z833 Family history of diabetes mellitus: Secondary | ICD-10-CM

## 2022-08-23 DIAGNOSIS — D649 Anemia, unspecified: Secondary | ICD-10-CM | POA: Diagnosis present

## 2022-08-23 DIAGNOSIS — R404 Transient alteration of awareness: Secondary | ICD-10-CM | POA: Diagnosis not present

## 2022-08-23 DIAGNOSIS — R6889 Other general symptoms and signs: Secondary | ICD-10-CM | POA: Diagnosis not present

## 2022-08-23 DIAGNOSIS — Z7901 Long term (current) use of anticoagulants: Secondary | ICD-10-CM | POA: Diagnosis not present

## 2022-08-23 DIAGNOSIS — K219 Gastro-esophageal reflux disease without esophagitis: Secondary | ICD-10-CM | POA: Diagnosis present

## 2022-08-23 DIAGNOSIS — I499 Cardiac arrhythmia, unspecified: Secondary | ICD-10-CM | POA: Diagnosis not present

## 2022-08-23 DIAGNOSIS — I110 Hypertensive heart disease with heart failure: Secondary | ICD-10-CM | POA: Diagnosis present

## 2022-08-23 DIAGNOSIS — M797 Fibromyalgia: Secondary | ICD-10-CM | POA: Diagnosis not present

## 2022-08-23 DIAGNOSIS — Z96653 Presence of artificial knee joint, bilateral: Secondary | ICD-10-CM | POA: Diagnosis present

## 2022-08-23 DIAGNOSIS — I82403 Acute embolism and thrombosis of unspecified deep veins of lower extremity, bilateral: Secondary | ICD-10-CM | POA: Diagnosis not present

## 2022-08-23 DIAGNOSIS — D638 Anemia in other chronic diseases classified elsewhere: Secondary | ICD-10-CM | POA: Diagnosis not present

## 2022-08-23 DIAGNOSIS — Z888 Allergy status to other drugs, medicaments and biological substances status: Secondary | ICD-10-CM

## 2022-08-23 DIAGNOSIS — Z6841 Body Mass Index (BMI) 40.0 and over, adult: Secondary | ICD-10-CM

## 2022-08-23 DIAGNOSIS — Z8349 Family history of other endocrine, nutritional and metabolic diseases: Secondary | ICD-10-CM

## 2022-08-23 DIAGNOSIS — Z9049 Acquired absence of other specified parts of digestive tract: Secondary | ICD-10-CM

## 2022-08-23 DIAGNOSIS — E039 Hypothyroidism, unspecified: Secondary | ICD-10-CM | POA: Diagnosis not present

## 2022-08-23 DIAGNOSIS — J189 Pneumonia, unspecified organism: Secondary | ICD-10-CM | POA: Diagnosis not present

## 2022-08-23 DIAGNOSIS — Z471 Aftercare following joint replacement surgery: Secondary | ICD-10-CM | POA: Diagnosis not present

## 2022-08-23 DIAGNOSIS — E785 Hyperlipidemia, unspecified: Secondary | ICD-10-CM | POA: Diagnosis present

## 2022-08-23 DIAGNOSIS — D509 Iron deficiency anemia, unspecified: Secondary | ICD-10-CM | POA: Diagnosis not present

## 2022-08-23 DIAGNOSIS — Y793 Surgical instruments, materials and orthopedic devices (including sutures) associated with adverse incidents: Secondary | ICD-10-CM | POA: Diagnosis present

## 2022-08-23 DIAGNOSIS — J440 Chronic obstructive pulmonary disease with acute lower respiratory infection: Secondary | ICD-10-CM | POA: Diagnosis not present

## 2022-08-23 LAB — CBC WITH DIFFERENTIAL/PLATELET
Abs Immature Granulocytes: 0.03 10*3/uL (ref 0.00–0.07)
Basophils Absolute: 0 10*3/uL (ref 0.0–0.1)
Basophils Relative: 0 %
Eosinophils Absolute: 0.1 10*3/uL (ref 0.0–0.5)
Eosinophils Relative: 1 %
HCT: 31.5 % — ABNORMAL LOW (ref 36.0–46.0)
Hemoglobin: 9.9 g/dL — ABNORMAL LOW (ref 12.0–15.0)
Immature Granulocytes: 0 %
Lymphocytes Relative: 24 %
Lymphs Abs: 2.2 10*3/uL (ref 0.7–4.0)
MCH: 26.1 pg (ref 26.0–34.0)
MCHC: 31.4 g/dL (ref 30.0–36.0)
MCV: 82.9 fL (ref 80.0–100.0)
Monocytes Absolute: 0.7 10*3/uL (ref 0.1–1.0)
Monocytes Relative: 8 %
Neutro Abs: 6 10*3/uL (ref 1.7–7.7)
Neutrophils Relative %: 67 %
Platelets: 343 10*3/uL (ref 150–400)
RBC: 3.8 MIL/uL — ABNORMAL LOW (ref 3.87–5.11)
RDW: 17.3 % — ABNORMAL HIGH (ref 11.5–15.5)
WBC: 9.1 10*3/uL (ref 4.0–10.5)
nRBC: 0 % (ref 0.0–0.2)

## 2022-08-23 LAB — COMPREHENSIVE METABOLIC PANEL
ALT: 19 U/L (ref 0–44)
AST: 16 U/L (ref 15–41)
Albumin: 3.4 g/dL — ABNORMAL LOW (ref 3.5–5.0)
Alkaline Phosphatase: 65 U/L (ref 38–126)
Anion gap: 9 (ref 5–15)
BUN: 35 mg/dL — ABNORMAL HIGH (ref 8–23)
CO2: 27 mmol/L (ref 22–32)
Calcium: 8.1 mg/dL — ABNORMAL LOW (ref 8.9–10.3)
Chloride: 104 mmol/L (ref 98–111)
Creatinine, Ser: 0.82 mg/dL (ref 0.44–1.00)
GFR, Estimated: >60 mL/min (ref >60)
Glucose, Bld: 153 mg/dL — ABNORMAL HIGH (ref 70–99)
Potassium: 3.5 mmol/L (ref 3.5–5.1)
Sodium: 140 mmol/L (ref 135–145)
Total Bilirubin: 0.1 mg/dL — ABNORMAL LOW (ref 0.3–1.2)
Total Protein: 7.7 g/dL (ref 6.5–8.1)

## 2022-08-23 LAB — PROTIME-INR
INR: 7 (ref 0.8–1.2)
INR: 6.2 (ref 0.8–1.2)
Prothrombin Time: 60.4 seconds — ABNORMAL HIGH (ref 11.4–15.2)
Prothrombin Time: 54.9 seconds — ABNORMAL HIGH (ref 11.4–15.2)

## 2022-08-23 LAB — GLUCOSE, CAPILLARY
Glucose-Capillary: 138 mg/dL — ABNORMAL HIGH (ref 70–99)
Glucose-Capillary: 179 mg/dL — ABNORMAL HIGH (ref 70–99)

## 2022-08-23 LAB — TROPONIN I (HIGH SENSITIVITY)
Troponin I (High Sensitivity): 16 ng/L (ref <18)
Troponin I (High Sensitivity): 15 ng/L (ref <18)

## 2022-08-23 LAB — POC OCCULT BLOOD, ED: Fecal Occult Bld: POSITIVE — AB

## 2022-08-23 LAB — URIC ACID: Uric Acid, Serum: 6.2 mg/dL (ref 2.5–7.1)

## 2022-08-23 LAB — SEDIMENTATION RATE: Sed Rate: 81 mm/hr — ABNORMAL HIGH (ref 0–22)

## 2022-08-23 MED ORDER — PANTOPRAZOLE SODIUM 40 MG PO TBEC
40.0000 mg | DELAYED_RELEASE_TABLET | Freq: Two times a day (BID) | ORAL | Status: DC
  Administered 2022-08-23 – 2022-09-01 (×18): 40 mg via ORAL
  Filled 2022-08-23 (×18): qty 1

## 2022-08-23 MED ORDER — FLUTICASONE PROPIONATE 50 MCG/ACT NA SUSP
2.0000 | Freq: Two times a day (BID) | NASAL | Status: DC
  Administered 2022-08-23 – 2022-09-01 (×16): 2 via NASAL
  Filled 2022-08-23: qty 16

## 2022-08-23 MED ORDER — ALBUTEROL SULFATE (2.5 MG/3ML) 0.083% IN NEBU: 2.5000 mg | INHALATION_SOLUTION | Freq: Four times a day (QID) | RESPIRATORY_TRACT | Status: DC | PRN

## 2022-08-23 MED ORDER — TRAMADOL HCL 50 MG PO TABS
50.0000 mg | ORAL_TABLET | Freq: Three times a day (TID) | ORAL | Status: DC | PRN
  Administered 2022-08-23 – 2022-08-26 (×4): 50 mg via ORAL
  Filled 2022-08-23 (×4): qty 1

## 2022-08-23 MED ORDER — LINACLOTIDE 145 MCG PO CAPS
145.0000 ug | ORAL_CAPSULE | ORAL | Status: DC
  Administered 2022-08-24: 145 ug via ORAL
  Filled 2022-08-23 (×2): qty 1

## 2022-08-23 MED ORDER — PRAVASTATIN SODIUM 40 MG PO TABS
40.0000 mg | ORAL_TABLET | Freq: Every evening | ORAL | Status: DC
  Administered 2022-08-23 – 2022-08-31 (×9): 40 mg via ORAL
  Filled 2022-08-23 (×9): qty 1

## 2022-08-23 MED ORDER — MOMETASONE FURO-FORMOTEROL FUM 200-5 MCG/ACT IN AERO
2.0000 | INHALATION_SPRAY | Freq: Two times a day (BID) | RESPIRATORY_TRACT | Status: DC
  Administered 2022-08-24 – 2022-09-01 (×17): 2 via RESPIRATORY_TRACT
  Filled 2022-08-23: qty 8.8

## 2022-08-23 MED ORDER — HYDROMORPHONE HCL 1 MG/ML IJ SOLN
0.5000 mg | INTRAMUSCULAR | Status: DC | PRN
  Administered 2022-08-23 – 2022-08-30 (×11): 1 mg via INTRAVENOUS
  Filled 2022-08-23 (×12): qty 1

## 2022-08-23 MED ORDER — INSULIN ASPART 100 UNIT/ML IJ SOLN
0.0000 [IU] | Freq: Every day | INTRAMUSCULAR | Status: DC
  Administered 2022-08-26: 2 [IU] via SUBCUTANEOUS

## 2022-08-23 MED ORDER — INSULIN ASPART 100 UNIT/ML IJ SOLN
0.0000 [IU] | Freq: Three times a day (TID) | INTRAMUSCULAR | Status: DC
  Administered 2022-08-23: 2 [IU] via SUBCUTANEOUS
  Administered 2022-08-24: 3 [IU] via SUBCUTANEOUS
  Administered 2022-08-24: 2 [IU] via SUBCUTANEOUS
  Administered 2022-08-24 – 2022-08-26 (×4): 3 [IU] via SUBCUTANEOUS
  Administered 2022-08-26: 5 [IU] via SUBCUTANEOUS
  Administered 2022-08-26: 3 [IU] via SUBCUTANEOUS
  Administered 2022-08-27 (×2): 2 [IU] via SUBCUTANEOUS
  Administered 2022-08-27 – 2022-08-28 (×2): 3 [IU] via SUBCUTANEOUS
  Administered 2022-08-28 – 2022-08-30 (×7): 2 [IU] via SUBCUTANEOUS
  Administered 2022-08-30 – 2022-09-01 (×6): 3 [IU] via SUBCUTANEOUS

## 2022-08-23 MED ORDER — LIDOCAINE 5 % EX PTCH
1.0000 | MEDICATED_PATCH | CUTANEOUS | Status: DC
  Administered 2022-08-24 – 2022-09-01 (×9): 1 via TRANSDERMAL
  Filled 2022-08-23 (×9): qty 1

## 2022-08-23 MED ORDER — METHOCARBAMOL 500 MG PO TABS
500.0000 mg | ORAL_TABLET | Freq: Three times a day (TID) | ORAL | Status: DC
  Administered 2022-08-23 – 2022-09-01 (×25): 500 mg via ORAL
  Filled 2022-08-23 (×25): qty 1

## 2022-08-23 MED ORDER — GABAPENTIN 300 MG PO CAPS
300.0000 mg | ORAL_CAPSULE | ORAL | Status: DC
  Administered 2022-08-23 – 2022-08-25 (×5): 300 mg via ORAL
  Filled 2022-08-23 (×5): qty 1

## 2022-08-23 MED ORDER — PHYTONADIONE 5 MG PO TABS
5.0000 mg | ORAL_TABLET | Freq: Once | ORAL | Status: AC
  Administered 2022-08-23: 5 mg via ORAL
  Filled 2022-08-23: qty 1

## 2022-08-23 MED ORDER — QUETIAPINE FUMARATE 25 MG PO TABS
50.0000 mg | ORAL_TABLET | Freq: Two times a day (BID) | ORAL | Status: DC
  Administered 2022-08-23 – 2022-09-01 (×18): 50 mg via ORAL
  Filled 2022-08-23 (×18): qty 2

## 2022-08-23 MED ORDER — SODIUM CHLORIDE 0.9 % IV BOLUS
500.0000 mL | Freq: Once | INTRAVENOUS | Status: AC
  Administered 2022-08-23: 500 mL via INTRAVENOUS

## 2022-08-23 MED ORDER — DULOXETINE HCL 30 MG PO CPEP
90.0000 mg | ORAL_CAPSULE | Freq: Every day | ORAL | Status: DC
  Administered 2022-08-23 – 2022-08-31 (×9): 90 mg via ORAL
  Filled 2022-08-23 (×9): qty 3

## 2022-08-23 MED ORDER — POLYETHYLENE GLYCOL 3350 17 G PO PACK: 17.0000 g | PACK | Freq: Every day | ORAL | Status: DC | PRN

## 2022-08-23 MED ORDER — PANTOPRAZOLE SODIUM 40 MG IV SOLR
40.0000 mg | Freq: Once | INTRAVENOUS | Status: AC
  Administered 2022-08-23: 40 mg via INTRAVENOUS
  Filled 2022-08-23: qty 10

## 2022-08-23 NOTE — Consult Note (Signed)
Reason for Consult: Guaiac positive stools. Referring Physician: [facility]  [name] is an 67 y.o. female.  HPI: [name] is a 67 year old black female with multiple medical problems listed below who presented to the hospital with a history of dizziness that she has had since yesterday. She has had bilateral knee replacements and was supposed to see Dr. August Saucer her orthopedist today but could not make it to her appointment because of her severe dizziness.  She came to the emergency room and was found to have guaiac positive stools. She denies any history of hematochezia. She has black stools and she has been on iron for several years. She has a history of chronic constipation and takes Linzess for symptomatic relief.  She denies having any abdominal pain, nausea, vomiting, dysphagia or odynophagia.  he had a normal colonoscopy in 2019 and a normal EGD in 2010. She had increased the dose of Coumadin last week for a therapeutic INR and has been followed by Dr. Truett Perna for her DVTs. ER showed her INR was found to be elevated at 7. Her hemoglobin was 9.9 g/dL slightly decreased from 11.6 g/dL on 03/25/1094. She is on omeprazole for GERD.  Past Medical History:  Diagnosis Date   Adrenal mass (HCC) 03/226   Benign   Arthritis    knees/multiple orthopedic conditons; lower back   Asthma    per pt   Clotting disorder (HCC)    +beta-2-glycoprotein IgA antibody   COPD (chronic obstructive pulmonary disease) (HCC)    inhalers dependent on environment   Depression    Diabetes mellitus    120s usually fasting -  dx more than 10 yrs ago   Dizziness, nonspecific    DVT (deep venous thrombosis) (HCC)    Recurrent   Fibromyalgia    GERD (gastroesophageal reflux disease)    Heart murmur    History of blood transfusion    History of cocaine abuse (HCC)    Remote history    Hyperlipidemia    Hypertension    takes meds daily   Hypothyroidism    Lupus Anticoagulant Positive     On home oxygen therapy    at night   Shortness of breath    exertion or lying flat   Sleep apnea    2l of oxygen at night (as of 12/6, she used to)   Past Surgical History:  Procedure Laterality Date   ABDOMINAL HYSTERECTOMY  1989   Fibroids   ANTERIOR LUMBAR FUSION  01/03/2012   Procedure: ANTERIOR LUMBAR FUSION 1 LEVEL;  Surgeon: Carmela Hurt, MD;  Location: [facility];  Service: Neurosurgery;  Laterality: N/A;  Lumbar Four-Five Anterior Lumbar Interbody Fusion with Instrumentation   BACK SURGERY     cervical spine---disk disease   CARPAL TUNNEL RELEASE     Bilateral   CESAREAN SECTION     CHOLECYSTECTOMY     CYSTOSCOPY/RETROGRADE/URETEROSCOPY/STONE EXTRACTION WITH BASKET Right 04/26/2014   Procedure: CYSTOSCOPY/ RIGHT RETROGRADE/ RIGHT URETEROSCOPY/URETERAL AND RENAL PELVIS BIOPSY;  Surgeon: Sebastian Ache, MD;  Location: [facility];  Service: Urology;  Laterality: Right;   gallstones removed     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   bilateral   right elbow surgery     RIGHT HEART CATH N/A 02/28/2021   Procedure: RIGHT HEART CATH;  Surgeon: Kathleene Hazel, MD;  Location: [facility];  Service: Cardiovascular;  Laterality: N/A;   THYROID LOBECTOMY Right 02/27/2017   THYROID LOBECTOMY Right 02/27/2017   Procedure:  RIGHT THYROID LOBECTOMY;  Surgeon: Harriette Bouillon, MD;  Location: [facility];  Service: General;  Laterality: Right;   TUBAL LIGATION     Family History  Problem Relation Age of Onset   Hypertension Mother    Diabetes Mother    Cancer Mother        Pancreatic   Hypertension Father    Heart failure Father    Hyperlipidemia Other    COPD Other    Social History:  reports that she has never smoked. She has never used smokeless tobacco. She reports current drug use. Drug: Cocaine. She reports that she does not drink alcohol.  Allergies:  Allergies  Allergen Reactions   Lisinopril Swelling    THROAT SWELLING Pt states her throat started to "close up"  after being on it for "so long."   Corticosteroids Rash    States she developed a rash on her tongue after a steroid injection in her bac   Medications: I have reviewed the patient's current medications. Prior to Admission:  Facility-Administered Medications Prior to Admission  Medication Dose Route Frequency Provider Last Rate Last Admin   sodium chloride flush (NS) 0.9 % injection 3 mL  3 mL Intravenous Q12H Chandrasekhar, [name] A, MD       Medications Prior to Admission  Medication Sig Dispense Refill Last Dose   ACCU-CHEK GUIDE test strip USE TO check blood glucose UP TO four times daily AS DIRECTED 100 strip 11    Accu-Chek Softclix Lancets lancets USE TO check blood glucose UP TO four times daily AS DIRECTED 400 each 3    albuterol (PROVENTIL) (2.5 MG/3ML) 0.083% nebulizer solution USE 1 VIAL IN NEBULIZER EVERY 6 HOURS - and as needed 120 mL 11    albuterol (VENTOLIN HFA) 108 (90 Base) MCG/ACT inhaler INHALE TWO PUFFS BY MOUTH INTO LUNGS every FOUR hours AS NEEDED FOR SHORTNESS OF BREATH 8 g 11    blood glucose meter kit and supplies KIT Dispense based on patient and insurance preference. Use up to four times daily as directed. (FOR ICD-9 250.00, 250.01). 1 each 0    cloNIDine (CATAPRES) 0.1 MG tablet TAKE ONE TABLET BY MOUTH BEFORE BREAKFAST and TAKE ONE TABLET BY MOUTH EVERYDAY AT BEDTIME 60 tablet 11    diclofenac sodium (VOLTAREN) 1 % GEL Apply 2 g topically daily as needed (fibromyalgia pain).      diltiazem (CARDIZEM CD) 240 MG 24 hr capsule TAKE ONE CAPSULE BY MOUTH BEFORE BREAKFAST 90 capsule 3    DULoxetine (CYMBALTA) 30 MG capsule Take 90 mg by mouth at bedtime.      FEROSUL 325 (65 Fe) MG tablet TAKE ONE TABLET BY MOUTH ONCE DAILY 90 tablet 0    fexofenadine (ALLEGRA) 180 MG tablet TAKE ONE TABLET BY MOUTH ONCE DAILY 30 tablet 3    fluticasone (FLONASE) 50 MCG/ACT nasal spray Place 2 sprays into both nostrils 2 (two) times daily. 18.2 mL 11    furosemide (LASIX) 80 MG tablet  TAKE ONE TABLET BY MOUTH TWICE DAILY 180 tablet 3    gabapentin (NEURONTIN) 300 MG capsule TAKE ONE CAPSULE BY MOUTH TWICE DAILY 180 capsule 2    Incontinence Supply Disposable (PROCARE BARIATRIC BRIEFS) MISC Use as needed. 96 each 11    INVOKANA 100 MG TABS tablet TAKE ONE TABLET BY MOUTH BEFORE BREAKFAST 90 tablet 3    lidocaine (LIDODERM) 5 % Place 1 patch onto the skin daily. Remove & Discard patch within 12 hours or as directed by MD  30 patch 0    LINZESS 145 MCG CAPS capsule TAKE ONE CAPSULE BY MOUTH every other DAY 60 capsule 1    magnesium oxide (MAG-OX) 400 MG tablet Take 1 tablet by mouth 2 (two) times daily.      metFORMIN (GLUCOPHAGE) 500 MG tablet TAKE ONE TABLET BY MOUTH EVERYDAY AT BEDTIME 90 tablet 3    methocarbamol (ROBAXIN) 500 MG tablet Take 1 tablet (500 mg total) by mouth 2 (two) times daily. 20 tablet 0    omeprazole (PRILOSEC) 20 MG capsule TAKE ONE CAPSULE BY MOUTH BEFORE BREAKFAST and TAKE ONE CAPSULE BY MOUTH EVERY EVENING 180 capsule 1    potassium chloride (MICRO-K) 10 MEQ CR capsule TAKE TWO CAPSULES BY MOUTH EVERYDAY AT BEDTIME 60 capsule 11    pravastatin (PRAVACHOL) 40 MG tablet TAKE ONE TABLET BY MOUTH EVERY EVENING 30 tablet 11    QUEtiapine (SEROQUEL) 50 MG tablet Take 50 mg by mouth 2 (two) times daily.      Semaglutide, 1 MG/DOSE, 4 MG/3ML SOPN Inject 1 mg as directed once a week. 3 mL 6    spironolactone (ALDACTONE) 25 MG tablet TAKE 1/2 TABLET BY MOUTH ONCE DAILY 45 tablet 2    SYMBICORT 160-4.5 MCG/ACT inhaler INHALE TWO PUFFS BY MOUTH INTO LUNGS twice daily 10.2 g 11    traMADol (ULTRAM) 50 MG tablet Take 1 tablet (50 mg total) by mouth every 12 (twelve) hours as needed. 30 tablet 0    Vitamin D, Ergocalciferol, (DRISDOL) 1.25 MG (50000 UNIT) CAPS capsule Take 1 capsule (50,000 Units total) by mouth every 7 (seven) days. 12 capsule 0    warfarin (COUMADIN) 10 MG tablet TAKE ONE TABLET BY MOUTH daily EXCEPT ON Monday, Wednesday, AND Friday OR AS DIRECTED 30  tablet 1    warfarin (COUMADIN) 7.5 MG tablet TAKE ONE TABLET BY MOUTH ON Monday, Wednesdays, AND Fridays 30 tablet 2    zonisamide (ZONEGRAN) 100 MG capsule Take by mouth.      zonisamide (ZONEGRAN) 50 MG capsule 1 TABLET AM AND 2 TABLETS PM      Scheduled: Continuous: PRN:  Results for orders placed or performed during the hospital encounter of 08/23/22 (from the past 48 hour(s))  CBC with Differential     Status: Abnormal   Collection Time: 08/23/22 11:44 AM  Result Value Ref Range   WBC 9.1 4.0 - 10.5 K/uL   RBC 3.80 (L) 3.87 - 5.11 MIL/uL   Hemoglobin 9.9 (L) 12.0 - 15.0 g/dL   HCT 16.1 (L) 09.6 - 04.5 %   MCV 82.9 80.0 - 100.0 fL   MCH 26.1 26.0 - 34.0 pg   MCHC 31.4 30.0 - 36.0 g/dL   RDW 40.9 (H) 81.1 - 91.4 %   Platelets 343 150 - 400 K/uL   nRBC 0.0 0.0 - 0.2 %   Neutrophils Relative % 67 %   Neutro Abs 6.0 1.7 - 7.7 K/uL   Lymphocytes Relative 24 %   Lymphs Abs 2.2 0.7 - 4.0 K/uL   Monocytes Relative 8 %   Monocytes Absolute 0.7 0.1 - 1.0 K/uL   Eosinophils Relative 1 %   Eosinophils Absolute 0.1 0.0 - 0.5 K/uL   Basophils Relative 0 %   Basophils Absolute 0.0 0.0 - 0.1 K/uL   Immature Granulocytes 0 %   Abs Immature Granulocytes 0.03 0.00 - 0.07 K/uL    Comment: Performed at [facility], [address] [place], [place] [address]  Comprehensive metabolic panel  Status: Abnormal   Collection Time: 08/23/22 11:44 AM  Result Value Ref Range   Sodium 140 135 - 145 mmol/L   Potassium 3.5 3.5 - 5.1 mmol/L   Chloride 104 98 - 111 mmol/L   CO2 27 22 - 32 mmol/L   Glucose, Bld 153 (H) 70 - 99 mg/dL    Comment: Glucose reference range applies only to samples taken after fasting for at least 8 hours.   BUN 35 (H) 8 - 23 mg/dL   Creatinine, Ser 2.13 0.44 - 1.00 mg/dL   Calcium 8.1 (L) 8.9 - 10.3 mg/dL   Total Protein 7.7 6.5 - 8.1 g/dL   Albumin 3.4 (L) 3.5 - 5.0 g/dL   AST 16 15 - 41 U/L   ALT 19 0 - 44 U/L   Alkaline Phosphatase 65 38 - 126  U/L   Total Bilirubin 0.1 (L) 0.3 - 1.2 mg/dL   GFR, Estimated >08 >65 mL/min    Comment: (NOTE) Calculated using the CKD-EPI Creatinine Equation (2021)    Anion gap 9 5 - 15    Comment: Performed at [facility], [address], [place], [place] [address]  Protime-INR     Status: Abnormal   Collection Time: 08/23/22 11:44 AM  Result Value Ref Range   Prothrombin Time 60.4 (H) 11.4 - 15.2 seconds   INR 7.0 (HH) 0.8 - 1.2    Comment: CRITICAL RESULT CALLED TO, READ BACK BY AND VERIFIED WITH: RN C BARRIER AT 12 08/23/22 CRUICKSHANK A (NOTE) INR Deanda varies based on device and disease states. Performed at [facility], [address] [place], [place] [address]   POC occult blood, ED     Status: Abnormal   Collection Time: 08/23/22  2:32 PM  Result Value Ref Range   Fecal Occult Bld POSITIVE (A) NEGATIVE   *Note: Due to a large number of results and/or encounters for the requested time period, some results have not been displayed. A complete set of results can be found in Results Review.    CT Head Wo Contrast  Result Date: 08/23/2022 CLINICAL DATA:  Syncope/presyncope, cerebrovascular cause suspected EXAM: CT HEAD WITHOUT CONTRAST TECHNIQUE: Contiguous axial images were obtained from the base of the skull through the vertex without intravenous contrast. RADIATION DOSE REDUCTION: This exam was performed according to the departmental dose-optimization program which includes automated exposure control, adjustment of the mA and/or kV according to patient size and/or use of iterative reconstruction technique. COMPARISON:  CT head 07/31/22 FINDINGS: Brain: No evidence of acute infarction, hemorrhage, hydrocephalus, extra-axial collection or mass lesion/mass effect. 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 Vascular: No hyperdense vessel or unexpected calcification. Skull: Normal. Negative for fracture  or focal lesion. Sinuses/Orbits: No middle ear or mastoid effusion. Paranasal sinuses are clear. Bilateral lens replacement. Orbits are otherwise unremarkable. Other: None. IMPRESSION: 1. No acute intracranial abnormality. 2. 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   On: 08/23/2022 14:16   DG Knee Complete 4 Views Left  Result Date: 08/23/2022 CLINICAL DATA:  Weakness and dizziness.  Left knee pain. EXAM: LEFT KNEE - COMPLETE 4+ VIEW COMPARISON:  10/07/2016 FINDINGS: Status post left total knee arthroplasty. Small suprapatellar joint effusion is suspected. No signs of periprosthetic fracture, dislocation or loosening. Soft tissues are unremarkable. IMPRESSION: 1. Status post left total knee arthroplasty. No signs  of periprosthetic fracture, loosening or dislocation 2. Small suprapatellar joint effusion. Electronically Signed   By: Signa Kell M.D.   On: 08/23/2022 12:08   DG Chest Port 1 View  Result Date: 08/23/2022 CLINICAL DATA:  weak EXAM: PORTABLE CHEST 1 VIEW COMPARISON:  CXR 02/18/21 FINDINGS: Borderline cardiomegaly. No pleural effusion. No pneumothorax. No focal airspace opacity. No radiographically apparent displaced rib fractures. Visualized upper abdomen is unremarkable. Partially visualized cervical spinal fusion hardware in place. IMPRESSION: No focal airspace opacity.  Borderline cardiomegaly. Electronically Signed   By: Lorenza Cambridge M.D.   On: 08/23/2022 12:05    Review of Systems  Constitutional:  Positive for fatigue.  HENT: Negative.    Eyes: Negative.   Respiratory: Negative.    Cardiovascular: Negative.   Gastrointestinal:  Positive for constipation.  Endocrine: Negative.   Genitourinary: Negative.   Musculoskeletal:  Positive for arthralgias.  Allergic/Immunologic: Negative.   Neurological:  Positive for dizziness and weakness.  Psychiatric/Behavioral: Negative.      Blood pressure (!) 111/97, pulse 93, temperature 98.4 F (36.9 C), temperature source Oral, resp. rate 17, SpO2 97 %. Physical Exam Constitutional:      General: She is not in acute distress.    Appearance: She is obese.  HENT:     Head: Normocephalic and atraumatic.     Comments: Multiple missing teeth; teeth in poor repair    Mouth/Throat:     Mouth: Mucous membranes are moist.  Eyes:     Extraocular Movements: Extraocular movements intact.     Pupils: Pupils are equal, round, and reactive to light.  Cardiovascular:     Rate and Rhythm: Normal rate and regular rhythm.  Pulmonary:     Effort: Pulmonary effort is normal.     Breath sounds: Normal breath sounds.  Musculoskeletal:     Cervical back: Normal range of motion and neck supple.  Skin:    General: Skin is warm and dry.  Neurological:     Mental Status: She is alert and oriented to person, place, and time.  Psychiatric:        Mood and Affect: Mood normal.        Behavior: Behavior normal.        Thought Content: Thought content normal.        Judgment: Judgment normal.   Assessment/Plan: 1) Iron deficiency anemia with near syncope/dizziness guaiac positive stools with supratherapeutic INR.  I do not feel the patient has any acute GI problems at this time even though she is at a mild drop in her hemoglobin. Her neurological symptoms need to be worked up before any further intervention can be undertaken. Additionally her INR is elevated at 7. 2) HTN/hyperlipidemia. 3) Chronic diastolic CHF. 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 5) Chronic knee pain/fibromyalgia. 6) Chronic constipation. 7) Morbid obesity/obstructive sleep apnea   Charna Elizabeth 08/23/2022, 4:06 PM

## 2022-08-23 NOTE — ED Notes (Signed)
Sample collected, test performed by Estell Harpin MD. Olga Millers
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
## 2022-08-23 NOTE — ED Notes (Signed)
ED TO INPATIENT HANDOFF REPORT  Name/Age/Gender [name] 67 y.o. female  Code Status Code Status History     Date Active Date Inactive Code Status Order ID Comments User Context   02/28/2021 0839 02/28/2021 1859 Full Code [id number]  Kathleene Hazel, MD Inpatient   03/09/2019 2233 03/16/2019 2113 Full Code [id number]  John Giovanni, MD ED   02/20/2019 0456 02/20/2019 1911 Full Code [id number]  Coletta Memos, MD ED   02/27/2017 1318 02/28/2017 1350 Full Code [id number]  Harriette Bouillon, MD Inpatient   06/12/2014 1948 06/14/2014 1723 Full Code [id number]  Pearson Grippe, MD Inpatient   04/24/2014 2234 04/30/2014 2027 Full Code [id number]  Drema Dallas, MD Inpatient   08/17/2013 1240 08/21/2013 2134 Full Code [id number]  Kathlen Mody, MD Inpatient       Home/SNF/Other Home  Chief Complaint Near syncope [R55]  Level of Care/Admitting Diagnosis ED Disposition     ED Disposition  Admit   Condition  --   Comment  Hospital Area: [facility] [[id number]]  Level of Care: Telemetry [5]  Admit to tele based on following criteria: Monitor for Ischemic changes  May place patient in observation at [facility] or [facility] if equivalent level of care is available:: Yes  Covid Evaluation: Asymptomatic - no recent exposure (last 10 days) testing not required  Diagnosis: Near syncope [518841]  Admitting Physician: RAI, RIPUDEEP K [[id number]]  Attending Physician: Thad Ranger K [[id number]]          Medical History Past Medical History:  Diagnosis Date   Adrenal mass (HCC) 03/226   Benign   Arthritis    knees/multiple orthopedic conditons; lower back   Asthma    per pt   Clotting disorder (HCC)    +beta-2-glycoprotein IgA antibody   COPD (chronic obstructive pulmonary disease) (HCC)    inhalers dependent on environment   Depression    Diabetes mellitus    120s usually fasting -  dx more than 10 yrs ago   Dizziness, nonspecific    DVT (deep venous  thrombosis) (HCC)    Recurrent   Fibromyalgia    GERD (gastroesophageal reflux disease)    Heart murmur    History of blood transfusion    History of cocaine abuse (HCC)    Remote history    Hyperlipidemia    Hypertension    takes meds daily   Hypothyroidism    Lupus Anticoagulant Positive    On home oxygen therapy    at night   Shortness of breath    exertion or lying flat   Sleep apnea    2l of oxygen at night (as of 12/6, she used to)    Allergies Allergies  Allergen Reactions   Lisinopril Swelling    THROAT SWELLING Pt states her throat started to "close up" after being on it for "so long."   Corticosteroids Rash    States she developed a rash on her tongue after a steroid injection in her bac    IV Location/Drains/Wounds Patient Lines/Drains/Airways Status     Active Line/Drains/Airways     Name Placement date Placement time Site Days   Peripheral IV 08/23/22 20 G Right Antecubital 08/23/22  1142  Antecubital  less than 1            Labs/Imaging Results for orders placed or performed during the hospital encounter of 08/23/22 (from the past 48 hour(s))  CBC with Differential     Status: Abnormal   Collection Time:  08/23/22 11:44 AM  Result Value Ref Range   WBC 9.1 4.0 - 10.5 K/uL   RBC 3.80 (L) 3.87 - 5.11 MIL/uL   Hemoglobin 9.9 (L) 12.0 - 15.0 g/dL   HCT 82.9 (L) 56.2 - 13.0 %   MCV 82.9 80.0 - 100.0 fL   MCH 26.1 26.0 - 34.0 pg   MCHC 31.4 30.0 - 36.0 g/dL   RDW 86.5 (H) 78.4 - 69.6 %   Platelets 343 150 - 400 K/uL   nRBC 0.0 0.0 - 0.2 %   Neutrophils Relative % 67 %   Neutro Abs 6.0 1.7 - 7.7 K/uL   Lymphocytes Relative 24 %   Lymphs Abs 2.2 0.7 - 4.0 K/uL   Monocytes Relative 8 %   Monocytes Absolute 0.7 0.1 - 1.0 K/uL   Eosinophils Relative 1 %   Eosinophils Absolute 0.1 0.0 - 0.5 K/uL   Basophils Relative 0 %   Basophils Absolute 0.0 0.0 - 0.1 K/uL   Immature Granulocytes 0 %   Abs Immature Granulocytes 0.03 0.00 - 0.07 K/uL     Comment: Performed at [facility], [address], [place], [place] [address]  Comprehensive metabolic panel     Status: Abnormal   Collection Time: 08/23/22 11:44 AM  Result Value Ref Range   Sodium 140 135 - 145 mmol/L   Potassium 3.5 3.5 - 5.1 mmol/L   Chloride 104 98 - 111 mmol/L   CO2 27 22 - 32 mmol/L   Glucose, Bld 153 (H) 70 - 99 mg/dL    Comment: Glucose reference range applies only to samples taken after fasting for at least 8 hours.   BUN 35 (H) 8 - 23 mg/dL   Creatinine, Ser 4.13 0.44 - 1.00 mg/dL   Calcium 8.1 (L) 8.9 - 10.3 mg/dL   Total Protein 7.7 6.5 - 8.1 g/dL   Albumin 3.4 (L) 3.5 - 5.0 g/dL   AST 16 15 - 41 U/L   ALT 19 0 - 44 U/L   Alkaline Phosphatase 65 38 - 126 U/L   Total Bilirubin 0.1 (L) 0.3 - 1.2 mg/dL   GFR, Estimated >24 >40 mL/min    Comment: (NOTE) Calculated using the CKD-EPI Creatinine Equation (2021)    Anion gap 9 5 - 15    Comment: Performed at [facility], [address], [place], [place] [address]  Protime-INR     Status: Abnormal   Collection Time: 08/23/22 11:44 AM  Result Value Ref Range   Prothrombin Time 60.4 (H) 11.4 - 15.2 seconds   INR 7.0 (HH) 0.8 - 1.2    Comment: CRITICAL RESULT CALLED TO, READ BACK BY AND VERIFIED WITH: RN C BARRIER AT 12 08/23/22 CRUICKSHANK A (NOTE) INR [place] varies based on device and disease states. Performed at [facility], [address], [place], [place] [address]   POC occult blood, ED     Status: Abnormal   Collection Time: 08/23/22  2:32 PM  Result Value Ref Range   Fecal Occult Bld POSITIVE (A) NEGATIVE   *Note: Due to a large number of results and/or encounters for the requested time period, some results have not been displayed. A complete set of results can be found in Results Review.   CT Head Wo Contrast  Result Date: 08/23/2022 CLINICAL DATA:  Syncope/presyncope, cerebrovascular cause suspected EXAM: CT HEAD WITHOUT CONTRAST  TECHNIQUE: Contiguous axial images were obtained from the base of the skull through the vertex without intravenous  contrast. RADIATION DOSE REDUCTION: This exam was performed according to the departmental dose-optimization program which includes automated exposure control, adjustment of the mA and/or kV according to patient size and/or use of iterative reconstruction technique. COMPARISON:  CT head 07/31/22 FINDINGS: Brain: No evidence of acute infarction, hemorrhage, hydrocephalus, extra-axial collection or mass lesion/mass effect. 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 Vascular: No hyperdense vessel or unexpected calcification. Skull: Normal. Negative for fracture or focal lesion. Sinuses/Orbits: No middle ear or mastoid effusion. Paranasal sinuses are clear. Bilateral lens replacement. Orbits are otherwise unremarkable. Other: None. IMPRESSION: 1. No acute intracranial abnormality. 2. 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   On: 08/23/2022 14:16   DG Knee Complete 4 Views Left  Result Date: 08/23/2022 CLINICAL DATA:  Weakness and dizziness.  Left knee pain. EXAM: LEFT KNEE - COMPLETE 4+ VIEW COMPARISON:  10/07/2016 FINDINGS: Status post left total knee arthroplasty. Small suprapatellar joint effusion is suspected. No signs of periprosthetic fracture, dislocation or loosening. Soft tissues are unremarkable. IMPRESSION: 1. Status post left total knee arthroplasty. No signs of periprosthetic fracture, loosening or dislocation 2. Small suprapatellar joint effusion. Electronically Signed   By: Signa Kell M.D.   On: 08/23/2022 12:08   DG Chest Port 1 View  Result Date: 08/23/2022 CLINICAL DATA:  weak EXAM: PORTABLE CHEST 1 VIEW COMPARISON:  CXR 02/18/21 FINDINGS: Borderline cardiomegaly. No pleural effusion. No pneumothorax. No focal  airspace opacity. No radiographically apparent displaced rib fractures. Visualized upper abdomen is unremarkable. Partially visualized cervical spinal fusion hardware in place. IMPRESSION: No focal airspace opacity.  Borderline cardiomegaly. Electronically Signed   By: Lorenza Cambridge M.D.   On: 08/23/2022 12:05    Pending Labs Unresulted Labs (From admission, onward)    None       Vitals/Pain Today's Vitals   08/23/22 1130 08/23/22 1215 08/23/22 1445 08/23/22 1459  BP: (!) 111/97     Pulse: 94 91 93   Resp: (!) 22 17    Temp:    98.4 F (36.9 C)  TempSrc:    Oral  SpO2: 98% 96% 97%   PainSc:        Isolation Precautions No active isolations  Medications Medications  sodium chloride 0.9 % bolus 500 mL (500 mLs Intravenous New Bag/Given 08/23/22 1142)  pantoprazole (PROTONIX) injection 40 mg (40 mg Intravenous Given 08/23/22 1406)    Mobility walks

## 2022-08-23 NOTE — H&P (Addendum)
History and Physical  Patient: [name] ONG:[id number] DOB: 1955-04-30 DOA: 08/23/2022 DOS: the patient was seen and examined on 08/23/2022 Patient coming from: Home  Chief Complaint:  Chief Complaint  Patient presents with   Fall   Dizziness   HPI: [name] is a 67 y.o. female with PMH significant of diabetes mellitus type 2, COPD, history of DVTs on Coumadin, history of clotting disorder, GERD, HTN, HLP, OSA, peripheral neuropathy presented to ED with dizziness.  Patient reported that she noted feeling somewhat dizzy yesterday and her left knee almost gave out.  She has been having left knee pain and has been operated on previously by Dr. August Saucer.  Today she felt more dizzy, diaphoretic, no chest pain abdominal pain or any headaches.  She continued to have constant knee pain, called Dr. Diamantina Providence office and had an appointment this morning however could not make it to the appointment due to dizziness.  Patient denies taking NSAIDs for the knee pain.  Takes PPI for GERD, previous endoscopy by Dr. Elnoria Howard.  No nausea, vomiting, abdominal pain or diarrhea.  Patient takes Coumadin for history of DVTs, followed by Dr. Alcide Evener.  Per patient, she was having low INR hence she had recently increased her Coumadin.  ED course:  In ED, temp 98.3, RR 27, heart rate 93, BP initially 133/82, then 111/97, O2 sats 97% on room air CMET unremarkable, CBC showed hemoglobin 9.9, was 11.6 on 06/22/2022 INR 7.0 FOBT positive   Review of Systems: As mentioned in the history of present illness. All other systems reviewed and are negative. Past Medical History:  Diagnosis Date   Adrenal mass (HCC) 03/226   Benign   Arthritis    knees/multiple orthopedic conditons; lower back   Asthma    per pt   Clotting disorder (HCC)    +beta-2-glycoprotein IgA antibody   COPD (chronic obstructive pulmonary disease) (HCC)    inhalers dependent on environment   Depression    Diabetes mellitus    120s usually  fasting -  dx more than 10 yrs ago   Dizziness, nonspecific    DVT (deep venous thrombosis) (HCC)    Recurrent   Fibromyalgia    GERD (gastroesophageal reflux disease)    Heart murmur    History of blood transfusion    History of cocaine abuse (HCC)    Remote history    Hyperlipidemia    Hypertension    takes meds daily   Hypothyroidism    Lupus Anticoagulant Positive    On home oxygen therapy    at night   Shortness of breath    exertion or lying flat   Sleep apnea    2l of oxygen at night (as of 12/6, she used to)   Past Surgical History:  Procedure Laterality Date   ABDOMINAL HYSTERECTOMY  1989   Fibroids   ANTERIOR LUMBAR FUSION  01/03/2012   Procedure: ANTERIOR LUMBAR FUSION 1 LEVEL;  Surgeon: Carmela Hurt, MD;  Location: [facility];  Service: Neurosurgery;  Laterality: N/A;  Lumbar Four-Five Anterior Lumbar Interbody Fusion with Instrumentation   BACK SURGERY     cervical spine---disk disease   CARPAL TUNNEL RELEASE     Bilateral   CESAREAN SECTION     CHOLECYSTECTOMY     CYSTOSCOPY/RETROGRADE/URETEROSCOPY/STONE EXTRACTION WITH BASKET Right 04/26/2014   Procedure: CYSTOSCOPY/ RIGHT RETROGRADE/ RIGHT URETEROSCOPY/URETERAL AND RENAL PELVIS BIOPSY;  Surgeon: Sebastian Ache, MD;  Location: [facility];  Service: Urology;  Laterality: Right;   gallstones  removed     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   bilateral   right elbow surgery     RIGHT HEART CATH N/A 02/28/2021   Procedure: RIGHT HEART CATH;  Surgeon: Kathleene Hazel, MD;  Location: [facility]AS[facility];  Service: Cardiovascular;  Laterality: N/A;   THYROID LOBECTOMY Right 02/27/2017   THYROID LOBECTOMY Right 02/27/2017   Procedure: RIGHT THYROID LOBECTOMY;  Surgeon: Harriette Bouillon, MD;  Location: [facility] OR;  Service: General;  Laterality: Right;   TUBAL LIGATION     Social History:  reports that she has never smoked. She has never used smokeless tobacco. She reports current drug use. Drug: Cocaine. She reports  that she does not drink alcohol. Allergies  Allergen Reactions   Lisinopril Swelling    THROAT SWELLING Pt states her throat started to "close up" after being on it for "so long."   Corticosteroids Rash    States she developed a rash on her tongue after a steroid injection in her bac   Family History  Problem Relation Age of Onset   Hypertension Mother    Diabetes Mother    Cancer Mother        Pancreatic   Hypertension Father    Heart failure Father    Hyperlipidemia Other    COPD Other    Prior to Admission medications   Medication Sig Start Date End Date Taking? Authorizing Provider  ACCU-CHEK GUIDE test strip USE TO check blood glucose UP TO four times daily AS DIRECTED 04/14/22   [name], MD  Accu-Chek Softclix Lancets lancets USE TO check blood glucose UP TO four times daily AS DIRECTED 07/24/22   [name], MD  albuterol (PROVENTIL) (2.5 MG/3ML) 0.083% nebulizer solution USE 1 VIAL IN NEBULIZER EVERY 6 HOURS - and as needed 08/11/21   [name], MD  albuterol (VENTOLIN HFA) 108 (90 Base) MCG/ACT inhaler INHALE TWO PUFFS BY MOUTH INTO LUNGS every FOUR hours AS NEEDED FOR SHORTNESS OF BREATH 04/21/22   [name], MD  blood glucose meter kit and supplies KIT Dispense based on patient and insurance preference. Use up to four times daily as directed. (FOR ICD-9 250.00, 250.01). 07/16/19   [name], MD  cloNIDine (CATAPRES) 0.1 MG tablet TAKE ONE TABLET BY MOUTH BEFORE BREAKFAST and TAKE ONE TABLET BY MOUTH EVERYDAY AT BEDTIME 04/14/22   [name], MD  diclofenac sodium (VOLTAREN) 1 % GEL Apply 2 g topically daily as needed (fibromyalgia pain).    [provider]  diltiazem (CARDIZEM CD) 240 MG 24 hr capsule TAKE ONE CAPSULE BY MOUTH BEFORE BREAKFAST 11/15/21   [name], MD  DULoxetine (CYMBALTA) 30 MG capsule Take 90 mg by mouth at bedtime.    [provider]  FEROSUL 325 (65 Fe) MG tablet TAKE ONE TABLET BY MOUTH ONCE  DAILY 07/19/21   [name], MD  fexofenadine (ALLEGRA) 180 MG tablet TAKE ONE TABLET BY MOUTH ONCE DAILY 07/24/22   [name], MD  fluticasone (FLONASE) 50 MCG/ACT nasal spray Place 2 sprays into both nostrils 2 (two) times daily. 06/22/22   [name], MD  furosemide (LASIX) 80 MG tablet TAKE ONE TABLET BY MOUTH TWICE DAILY 02/14/22   Riley Lam A, MD  gabapentin (NEURONTIN) 300 MG capsule TAKE ONE CAPSULE BY MOUTH TWICE DAILY 01/13/22   [name], MD  Incontinence Supply Disposable (PROCARE BARIATRIC BRIEFS) MISC Use as needed. 04/20/22   [name],  MD  INVOKANA 100 MG TABS tablet TAKE ONE TABLET BY MOUTH BEFORE BREAKFAST 11/15/21   Chandrasekhar, [name] A, MD  lidocaine (LIDODERM) 5 % Place 1 patch onto the skin daily. Remove & Discard patch within 12 hours or as directed by MD 03/22/22   Blue, Soijett A, PA-C  LINZESS 145 MCG CAPS capsule TAKE ONE CAPSULE BY MOUTH every other DAY 02/14/22   [name], MD  magnesium oxide (MAG-OX) 400 MG tablet Take 1 tablet by mouth 2 (two) times daily. 07/24/22   [provider]  metFORMIN (GLUCOPHAGE) 500 MG tablet TAKE ONE TABLET BY MOUTH EVERYDAY AT BEDTIME 11/15/21   [name], MD  methocarbamol (ROBAXIN) 500 MG tablet Take 1 tablet (500 mg total) by mouth 2 (two) times daily. 03/22/22   Blue, Soijett A, PA-C  omeprazole (PRILOSEC) 20 MG capsule TAKE ONE CAPSULE BY MOUTH BEFORE BREAKFAST and TAKE ONE CAPSULE BY MOUTH EVERY EVENING 06/14/22   [name], MD  potassium chloride (MICRO-K) 10 MEQ CR capsule TAKE TWO CAPSULES BY MOUTH EVERYDAY AT BEDTIME 04/14/22   [name], MD  pravastatin (PRAVACHOL) 40 MG tablet TAKE ONE TABLET BY MOUTH EVERY EVENING 04/14/22   [name], MD  QUEtiapine (SEROQUEL) 50 MG tablet Take 50 mg by mouth 2 (two) times daily.    [provider]  Semaglutide, 1 MG/DOSE, 4 MG/3ML SOPN Inject 1 mg as directed once a week. 04/20/22   [name], MD   spironolactone (ALDACTONE) 25 MG tablet TAKE 1/2 TABLET BY MOUTH ONCE DAILY 01/12/22   Christell Constant, MD  SYMBICORT 160-4.5 MCG/ACT inhaler INHALE TWO PUFFS BY MOUTH INTO LUNGS twice daily 04/14/22   [name], MD  traMADol (ULTRAM) 50 MG tablet Take 1 tablet (50 mg total) by mouth every 12 (twelve) hours as needed. 04/20/22   [name], MD  Vitamin D, Ergocalciferol, (DRISDOL) 1.25 MG (50000 UNIT) CAPS capsule Take 1 capsule (50,000 Units total) by mouth every 7 (seven) days. 07/21/22   [name], MD  warfarin (COUMADIN) 10 MG tablet TAKE ONE TABLET BY MOUTH daily EXCEPT ON Monday, Wednesday, AND Friday OR AS DIRECTED 06/15/22   Ladene Artist, MD  warfarin (COUMADIN) 7.5 MG tablet TAKE ONE TABLET BY MOUTH ON Monday, Wednesdays, AND Fridays 05/15/22   Ladene Artist, MD  zonisamide (ZONEGRAN) 100 MG capsule Take by mouth. 11/24/21   [provider]  zonisamide (ZONEGRAN) 50 MG capsule 1 TABLET AM AND 2 TABLETS PM    [provider]   Physical Exam: Vitals:   08/23/22 1130 08/23/22 1215 08/23/22 1445 08/23/22 1459  BP: (!) 111/97     Pulse: 94 91 93   Resp: (!) 22 17    Temp:    98.4 F (36.9 C)  TempSrc:    Oral  SpO2: 98% 96% 97%      General: Alert, awake, oriented x3, NAD Eyes: pink conjunctiva, anicteric sclera, PERLA HEENT: normocephalic, atraumatic, oropharynx clear Neck: supple, no masses or lymphadenopathy, no JVD CVS: Regular rate and rhythm, no murmurs, rubs or gallops. Resp : Clear to auscultation bilaterally, no wheezing, rales or rhonchi. GI : Soft, nontender, nondistended, positive bowel sounds. No hepatomegaly.  Ext: No lower extremity edema  Musculoskeletal: Left knee effusion with difficulty bending the knee due to pain. Neuro: Grossly intact, no focal neurological deficits, strength 5/5 upper and lower extremities bilaterally Psych: alert and oriented x 3, normal mood and affect Skin: no  rashes or lesions, warm and  dry   Data Reviewed: I have reviewed ED notes, Vitals, Lab results and outpatient records.   Recent Labs  Lab 08/23/22 1144  NA 140  K 3.5  CL 104  CO2 27  GLUCOSE 153*  BUN 35*  CREATININE 0.82  CALCIUM 8.1*   Recent Labs  Lab 08/23/22 1144  WBC 9.1  NEUTROABS 6.0  HGB 9.9*  HCT 31.5*  MCV 82.9  PLT 343    Assessment and Plan Principal Problem:   Near syncope -Patient noted to be borderline BP on multiple antihypertensives outpatient (clonidine, Cardizem, Lasix, Aldactone).  Hold antihypertensives. -Obtain orthostatic vitals, patient received 500 cc bolus in ED -Obtain troponins x2, EKG showed no acute ST-T wave changes suggestive of ischemia, NSR, QTc 484 -Obtain 2D echo for further workup -Concern for GI bleed with FOBT positive, anemia and supratherapeutic INR  Active Problems:   HTN (hypertension) -BP currently soft, hold antihypertensives  History of DVTs on chronic anticoagulation with Coumadin, Supratherapeutic INR -Hold Coumadin, INR 7.0 due to concern for GI bleed -Vitamin K 5 mg p.o. x 1, recheck INR   Normocytic anemia, ?  GI bleed -FOBT positive with supratherapeutic INR.  Patient has history of GERD however denies taking any NSAIDs -Baseline hemoglobin 12, on 06/22/2022 was 11.6, trended down to 9.9 today -Continue PPI twice daily -Per EDP, Dr. Estell Harpin, calling GI/Dr. Elnoria Howard for further conditions    Chronic diastolic CHF (congestive heart failure) (HCC) -Previous echo in 04/2020 showed EF of 60 to 65%, G1 DD -Currently euvolemic due to near syncope, borderline BP, holding diuretics including Lasix, Aldactone     Mixed hyperlipidemia -Continue statin  Acute on chronic left knee pain  -Patient has a prior history of left knee arthroplasty.  X-ray showed small suprapatellar joint effusion, no signs of periprosthetic fracture loosening or dislocation -Due to acute severe pain, will request orthopedic evaluation, may need ESI for pain control. -  obtain ESR, CRP, uric acid -Continue tramadol, Robaxin, gabapentin.  Added IV Dilaudid as needed for severe pain  Advance Care Planning:   Code Status: Full Code discussed with the patient and daughter at the bedside Consults: Orthopedics, GI Family Communication: Discussed with daughter at the bedside Severity of Illness:      The appropriate patient status for this patient is OBSERVATION. Observation status is judged to be reasonable and necessary in order to provide the required intensity of service to ensure the patient's safety. The patient's presenting symptoms, physical exam findings, and initial radiographic and laboratory data in the context of their medical condition is felt to place them at decreased risk for further clinical deterioration. Furthermore, it is anticipated that the patient will be medically stable for discharge from the hospital within 2 midnights of admission.     Author: Thad Ranger, MD 08/23/2022 3:03 PM For on call review [email or web address].

## 2022-08-23 NOTE — ED Notes (Signed)
Pt was repositioned and pulled up in bed.

## 2022-08-23 NOTE — ED Provider Notes (Signed)
[place] [facility] AT [facility] Provider Note   CSN: [id number] Arrival date & time: 08/23/22  1037     History {Add pertinent medical, surgical, social history, OB history to HPI:1} Chief Complaint  Patient presents with   Fall   Dizziness    [name] is a 67 y.o. female.  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  She states that today she felt dizzy and was stepping out of the car and fell to the ground.  She has a bad left knee that has recently been operated on.  Patient states she still has mild dizziness  The history is provided by the patient and medical records. No language interpreter was used.  Fall This is a new problem. The current episode started 6 to 12 hours ago. The problem occurs rarely. The problem has been resolved. Pertinent negatives include no chest pain, no abdominal pain and no headaches. Nothing aggravates the symptoms. Nothing relieves the symptoms. She has tried nothing for the symptoms. The treatment provided no relief.       Home Medications Prior to Admission medications   Medication Sig Start Date End Date Taking? Authorizing Provider  ACCU-CHEK GUIDE test strip USE TO check blood glucose UP TO four times daily AS DIRECTED 04/14/22   [name], MD  Accu-Chek Softclix Lancets lancets USE TO check blood glucose UP TO four times daily AS DIRECTED 07/24/22   [name], MD  albuterol (PROVENTIL) (2.5 MG/3ML) 0.083% nebulizer solution USE 1 VIAL IN NEBULIZER EVERY 6 HOURS - and as needed 08/11/21   [name], MD  albuterol (VENTOLIN HFA) 108 (90 Base) MCG/ACT inhaler INHALE TWO PUFFS BY MOUTH INTO LUNGS every FOUR hours AS NEEDED FOR SHORTNESS OF BREATH 04/21/22   [name], MD  blood glucose meter kit and supplies KIT Dispense based on patient and insurance preference. Use up to four times daily as directed. (FOR ICD-9 250.00, 250.01). 07/16/19   [name], MD  cloNIDine (CATAPRES)  0.1 MG tablet TAKE ONE TABLET BY MOUTH BEFORE BREAKFAST and TAKE ONE TABLET BY MOUTH EVERYDAY AT BEDTIME 04/14/22   [name], MD  diclofenac sodium (VOLTAREN) 1 % GEL Apply 2 g topically daily as needed (fibromyalgia pain).    [provider]  diltiazem (CARDIZEM CD) 240 MG 24 hr capsule TAKE ONE CAPSULE BY MOUTH BEFORE BREAKFAST 11/15/21   [name], MD  DULoxetine (CYMBALTA) 30 MG capsule Take 90 mg by mouth at bedtime.    [provider]  FEROSUL 325 (65 Fe) MG tablet TAKE ONE TABLET BY MOUTH ONCE DAILY 07/19/21   [name], MD  fexofenadine (ALLEGRA) 180 MG tablet TAKE ONE TABLET BY MOUTH ONCE DAILY 07/24/22   [name], MD  fluticasone (FLONASE) 50 MCG/ACT nasal spray Place 2 sprays into both nostrils 2 (two) times daily. 06/22/22   [name], MD  furosemide (LASIX) 80 MG tablet TAKE ONE TABLET BY MOUTH TWICE DAILY 02/14/22   Riley Lam A, MD  gabapentin (NEURONTIN) 300 MG capsule TAKE ONE CAPSULE BY MOUTH TWICE DAILY 01/13/22   [name], MD  Incontinence Supply Disposable (PROCARE BARIATRIC BRIEFS) MISC Use as needed. 04/20/22   [name], MD  INVOKANA 100 MG TABS tablet TAKE ONE TABLET BY MOUTH BEFORE BREAKFAST 11/15/21   Chandrasekhar, [name] A, MD  lidocaine (LIDODERM) 5 % Place 1 patch onto the skin daily. Remove & Discard  patch within 12 hours or as directed by MD 03/22/22   Blue, Soijett A, PA-C  LINZESS 145 MCG CAPS capsule TAKE ONE CAPSULE BY MOUTH every other DAY 02/14/22   [name], MD  magnesium oxide (MAG-OX) 400 MG tablet Take 1 tablet by mouth 2 (two) times daily. 07/24/22   [provider]  metFORMIN (GLUCOPHAGE) 500 MG tablet TAKE ONE TABLET BY MOUTH EVERYDAY AT BEDTIME 11/15/21   [name], MD  methocarbamol (ROBAXIN) 500 MG tablet Take 1 tablet (500 mg total) by mouth 2 (two) times daily. 03/22/22   Blue, Soijett A, PA-C  omeprazole (PRILOSEC) 20 MG capsule TAKE ONE CAPSULE BY MOUTH BEFORE  BREAKFAST and TAKE ONE CAPSULE BY MOUTH EVERY EVENING 06/14/22   [name], MD  potassium chloride (MICRO-K) 10 MEQ CR capsule TAKE TWO CAPSULES BY MOUTH EVERYDAY AT BEDTIME 04/14/22   [name], MD  pravastatin (PRAVACHOL) 40 MG tablet TAKE ONE TABLET BY MOUTH EVERY EVENING 04/14/22   [name], MD  QUEtiapine (SEROQUEL) 50 MG tablet Take 50 mg by mouth 2 (two) times daily.    [provider]  Semaglutide, 1 MG/DOSE, 4 MG/3ML SOPN Inject 1 mg as directed once a week. 04/20/22   [name], MD  spironolactone (ALDACTONE) 25 MG tablet TAKE 1/2 TABLET BY MOUTH ONCE DAILY 01/12/22   Christell Constant, MD  SYMBICORT 160-4.5 MCG/ACT inhaler INHALE TWO PUFFS BY MOUTH INTO LUNGS twice daily 04/14/22   [name], MD  traMADol (ULTRAM) 50 MG tablet Take 1 tablet (50 mg total) by mouth every 12 (twelve) hours as needed. 04/20/22   [name], MD  Vitamin D, Ergocalciferol, (DRISDOL) 1.25 MG (50000 UNIT) CAPS capsule Take 1 capsule (50,000 Units total) by mouth every 7 (seven) days. 07/21/22   [name], MD  warfarin (COUMADIN) 10 MG tablet TAKE ONE TABLET BY MOUTH daily EXCEPT ON Monday, Wednesday, AND Friday OR AS DIRECTED 06/15/22   Ladene Artist, MD  warfarin (COUMADIN) 7.5 MG tablet TAKE ONE TABLET BY MOUTH ON Monday, Wednesdays, AND Fridays 05/15/22   Ladene Artist, MD  zonisamide (ZONEGRAN) 100 MG capsule Take by mouth. 11/24/21   [provider]  zonisamide (ZONEGRAN) 50 MG capsule 1 TABLET AM AND 2 TABLETS PM    [provider]      Allergies    Lisinopril and Corticosteroids    Review of Systems   Review of Systems  Constitutional:  Negative for appetite change and fatigue.  HENT:  Negative for congestion, ear discharge and sinus pressure.   Eyes:  Negative for discharge.  Respiratory:  Negative for cough.   Cardiovascular:  Negative for chest pain.  Gastrointestinal:  Negative for abdominal pain and diarrhea.   Genitourinary:  Negative for frequency and hematuria.  Musculoskeletal:  Negative for back pain.       Left knee pain  Skin:  Negative for rash.  Neurological:  Positive for dizziness. Negative for seizures and headaches.  Psychiatric/Behavioral:  Negative for hallucinations.     Physical Exam Updated Vital Signs BP (!) 111/97   Pulse 93   Temp 98.4 F (36.9 C) (Oral)   Resp 17   SpO2 97%  Physical Exam Vitals and nursing note reviewed.  Constitutional:      Appearance: She is well-developed.  HENT:     Head: Normocephalic.     Nose: Nose normal.  Eyes:     General: No scleral icterus.  Conjunctiva/sclera: Conjunctivae normal.  Neck:     Thyroid: No thyromegaly.  Cardiovascular:     Rate and Rhythm: Normal rate and regular rhythm.     Heart sounds: No murmur heard.    No friction rub. No gallop.  Pulmonary:     Breath sounds: No stridor. No wheezing or rales.  Chest:     Chest wall: No tenderness.  Abdominal:     General: There is no distension.     Tenderness: There is no abdominal tenderness. There is no rebound.  Genitourinary:    Comments: Heme positive stool Musculoskeletal:        General: Normal range of motion.     Cervical back: Neck supple.     Comments: Mild swelling tenderness to left knee  Lymphadenopathy:     Cervical: No cervical adenopathy.  Skin:    Findings: No erythema or rash.  Neurological:     Mental Status: She is alert and oriented to person, place, and time.     Motor: No abnormal muscle tone.     Coordination: Coordination normal.  Psychiatric:        Behavior: Behavior normal.     ED Results / Procedures / Treatments   Labs (all labs ordered are listed, but only abnormal results are displayed) Labs Reviewed  CBC WITH DIFFERENTIAL/PLATELET - Abnormal; Notable for the following components:      Result Value   RBC 3.80 (*)    Hemoglobin 9.9 (*)    HCT 31.5 (*)    RDW 17.3 (*)    All other components within normal limits   COMPREHENSIVE METABOLIC PANEL - Abnormal; Notable for the following components:   Glucose, Bld 153 (*)    BUN 35 (*)    Calcium 8.1 (*)    Albumin 3.4 (*)    Total Bilirubin 0.1 (*)    All other components within normal limits  PROTIME-INR - Abnormal; Notable for the following components:   Prothrombin Time 60.4 (*)    INR 7.0 (*)    All other components within normal limits  POC OCCULT BLOOD, ED - Abnormal; Notable for the following components:   Fecal Occult Bld POSITIVE (*)    All other components within normal limits  POC OCCULT BLOOD, ED    EKG EKG Interpretation  Date/Time:  Wednesday August 23 2022 11:21:23 EDT Ventricular Rate:  91 PR Interval:  154 QRS Duration: 94 QT Interval:  393 QTC Calculation: 484 R Axis:   34 Text Interpretation: Sinus rhythm Borderline T wave abnormalities Confirmed by Bethann Berkshire [phone]) on 08/23/2022 11:53:34 AM  Radiology CT Head Wo Contrast  Result Date: 08/23/2022 CLINICAL DATA:  Syncope/presyncope, cerebrovascular cause suspected EXAM: CT HEAD WITHOUT CONTRAST TECHNIQUE: Contiguous axial images were obtained from the base of the skull through the vertex without intravenous contrast. RADIATION DOSE REDUCTION: This exam was performed according to the departmental dose-optimization program which includes automated exposure control, adjustment of the mA and/or kV according to patient size and/or use of iterative reconstruction technique. COMPARISON:  CT head 07/31/22 FINDINGS: Brain: No evidence of acute infarction, hemorrhage, hydrocephalus, extra-axial collection or mass lesion/mass effect. 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 Vascular: No hyperdense vessel or unexpected calcification. Skull: Normal. Negative for fracture or focal lesion. Sinuses/Orbits: No middle ear or mastoid effusion. Paranasal sinuses are clear. Bilateral lens replacement. Orbits are otherwise unremarkable.  Other: None. IMPRESSION: 1. No acute intracranial abnormality. 2. 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   On: 08/23/2022 14:16   DG Knee Complete 4 Views Left  Result Date: 08/23/2022 CLINICAL DATA:  Weakness and dizziness.  Left knee pain. EXAM: LEFT KNEE - COMPLETE 4+ VIEW COMPARISON:  10/07/2016 FINDINGS: Status post left total knee arthroplasty. Small suprapatellar joint effusion is suspected. No signs of periprosthetic fracture, dislocation or loosening. Soft tissues are unremarkable. IMPRESSION: 1. Status post left total knee arthroplasty. No signs of periprosthetic fracture, loosening or dislocation 2. Small suprapatellar joint effusion. Electronically Signed   By: Signa Kell M.D.   On: 08/23/2022 12:08   DG Chest Port 1 View  Result Date: 08/23/2022 CLINICAL DATA:  weak EXAM: PORTABLE CHEST 1 VIEW COMPARISON:  CXR 02/18/21 FINDINGS: Borderline cardiomegaly. No pleural effusion. No pneumothorax. No focal airspace opacity. No radiographically apparent displaced rib fractures. Visualized upper abdomen is unremarkable. Partially visualized cervical spinal fusion hardware in place. IMPRESSION: No focal airspace opacity.  Borderline cardiomegaly. Electronically Signed   By: Lorenza Cambridge M.D.   On: 08/23/2022 12:05    Procedures Procedures  {Document cardiac monitor, telemetry assessment procedure when appropriate:1}  Medications Ordered in ED Medications  sodium chloride 0.9 % bolus 500 mL (500 mLs Intravenous New Bag/Given 08/23/22 1142)  pantoprazole (PROTONIX) injection 40 mg (40 mg Intravenous Given 08/23/22 1406)    ED Course/ Medical Decision Making/ A&P   {  CRITICAL CARE Performed by: Bethann Berkshire Total critical care time: 40 minutes Critical care time was exclusive of separately billable procedures and treating other patients. Critical care was necessary to treat or  prevent imminent or life-threatening deterioration. Critical care was time spent personally by me on the following activities: development of treatment plan with patient and/or surrogate as well as nursing, discussions with consultants, evaluation of patient's response to treatment, examination of patient, obtaining history from patient or surrogate, ordering and performing treatments and interventions, ordering and review of laboratory studies, ordering and review of radiographic studies, pulse oximetry and re-evaluation of patient's condition.    Patient with most likely upper GI bleed causing anemia.  Patient has an elevated PTT of 7.0.  She will have her Coumadin held.  She will be seen by GI.  I spoke with Dr. Audley Hose.  Patient will be admitted to medicine and given Protonix Click here for ABCD2, HEART and other calculatorsREFRESH Note before signing :1}                          Medical Decision Making Amount and/or Complexity of Data Reviewed Labs: ordered. Radiology: ordered. ECG/medicine tests: ordered.  Risk Prescription drug management. Decision regarding hospitalization.  Dizziness with upper GI bleed.  {Document critical care time when appropriate:1} {Document review of labs and clinical decision tools ie heart score, Chads2Vasc2 etc:1}  {Document your independent review of radiology images, and any outside records:1} {Document your discussion with family members, caretakers, and with consultants:1} {Document social determinants of health affecting pt's care:1} {Document your decision making why or why not admission, treatments were needed:1} Final Clinical Impression(s) / ED Diagnoses Final diagnoses:  Upper GI bleed    Rx / DC Orders ED Discharge Orders     None

## 2022-08-23 NOTE — Progress Notes (Signed)
Date and time results received: 08/23/22 2310 (use smartphrase ".now" to insert current time)  Test: INR Critical Value: 6.2  Name of Provider Notified: MD on call  Orders Received? Or Actions Taken?:  waiting for new orders.

## 2022-08-23 NOTE — Telephone Encounter (Signed)
Patient called to cancel appointment for today appointment 08/30/22

## 2022-08-24 ENCOUNTER — Observation Stay (HOSPITAL_COMMUNITY): Payer: 59

## 2022-08-24 ENCOUNTER — Ambulatory Visit: Payer: 59

## 2022-08-24 DIAGNOSIS — R791 Abnormal coagulation profile: Secondary | ICD-10-CM | POA: Diagnosis not present

## 2022-08-24 DIAGNOSIS — R404 Transient alteration of awareness: Secondary | ICD-10-CM | POA: Diagnosis not present

## 2022-08-24 DIAGNOSIS — D500 Iron deficiency anemia secondary to blood loss (chronic): Secondary | ICD-10-CM | POA: Diagnosis not present

## 2022-08-24 DIAGNOSIS — R531 Weakness: Secondary | ICD-10-CM | POA: Diagnosis not present

## 2022-08-24 DIAGNOSIS — M25562 Pain in left knee: Secondary | ICD-10-CM | POA: Diagnosis not present

## 2022-08-24 DIAGNOSIS — I10 Essential (primary) hypertension: Secondary | ICD-10-CM | POA: Diagnosis not present

## 2022-08-24 DIAGNOSIS — K31811 Angiodysplasia of stomach and duodenum with bleeding: Secondary | ICD-10-CM | POA: Diagnosis not present

## 2022-08-24 DIAGNOSIS — M797 Fibromyalgia: Secondary | ICD-10-CM | POA: Diagnosis present

## 2022-08-24 DIAGNOSIS — K922 Gastrointestinal hemorrhage, unspecified: Secondary | ICD-10-CM | POA: Diagnosis not present

## 2022-08-24 DIAGNOSIS — T8484XA Pain due to internal orthopedic prosthetic devices, implants and grafts, initial encounter: Secondary | ICD-10-CM | POA: Diagnosis present

## 2022-08-24 DIAGNOSIS — Z79899 Other long term (current) drug therapy: Secondary | ICD-10-CM | POA: Diagnosis not present

## 2022-08-24 DIAGNOSIS — I110 Hypertensive heart disease with heart failure: Secondary | ICD-10-CM | POA: Diagnosis not present

## 2022-08-24 DIAGNOSIS — K5909 Other constipation: Secondary | ICD-10-CM | POA: Diagnosis present

## 2022-08-24 DIAGNOSIS — M25462 Effusion, left knee: Secondary | ICD-10-CM | POA: Diagnosis not present

## 2022-08-24 DIAGNOSIS — J440 Chronic obstructive pulmonary disease with acute lower respiratory infection: Secondary | ICD-10-CM | POA: Diagnosis not present

## 2022-08-24 DIAGNOSIS — R55 Syncope and collapse: Secondary | ICD-10-CM

## 2022-08-24 DIAGNOSIS — D62 Acute posthemorrhagic anemia: Secondary | ICD-10-CM | POA: Diagnosis present

## 2022-08-24 DIAGNOSIS — K317 Polyp of stomach and duodenum: Secondary | ICD-10-CM | POA: Diagnosis not present

## 2022-08-24 DIAGNOSIS — Z7401 Bed confinement status: Secondary | ICD-10-CM | POA: Diagnosis not present

## 2022-08-24 DIAGNOSIS — Z7984 Long term (current) use of oral hypoglycemic drugs: Secondary | ICD-10-CM | POA: Diagnosis not present

## 2022-08-24 DIAGNOSIS — Z7901 Long term (current) use of anticoagulants: Secondary | ICD-10-CM | POA: Diagnosis not present

## 2022-08-24 DIAGNOSIS — D6862 Lupus anticoagulant syndrome: Secondary | ICD-10-CM | POA: Diagnosis present

## 2022-08-24 DIAGNOSIS — E782 Mixed hyperlipidemia: Secondary | ICD-10-CM | POA: Diagnosis present

## 2022-08-24 DIAGNOSIS — E1142 Type 2 diabetes mellitus with diabetic polyneuropathy: Secondary | ICD-10-CM | POA: Diagnosis present

## 2022-08-24 DIAGNOSIS — J189 Pneumonia, unspecified organism: Secondary | ICD-10-CM | POA: Diagnosis not present

## 2022-08-24 DIAGNOSIS — Z96652 Presence of left artificial knee joint: Secondary | ICD-10-CM | POA: Diagnosis not present

## 2022-08-24 DIAGNOSIS — D509 Iron deficiency anemia, unspecified: Secondary | ICD-10-CM | POA: Diagnosis not present

## 2022-08-24 DIAGNOSIS — Z86718 Personal history of other venous thrombosis and embolism: Secondary | ICD-10-CM | POA: Diagnosis not present

## 2022-08-24 DIAGNOSIS — Z741 Need for assistance with personal care: Secondary | ICD-10-CM | POA: Diagnosis not present

## 2022-08-24 DIAGNOSIS — Z6841 Body Mass Index (BMI) 40.0 and over, adult: Secondary | ICD-10-CM | POA: Diagnosis not present

## 2022-08-24 DIAGNOSIS — E876 Hypokalemia: Secondary | ICD-10-CM | POA: Diagnosis not present

## 2022-08-24 DIAGNOSIS — K219 Gastro-esophageal reflux disease without esophagitis: Secondary | ICD-10-CM | POA: Diagnosis not present

## 2022-08-24 DIAGNOSIS — I82403 Acute embolism and thrombosis of unspecified deep veins of lower extremity, bilateral: Secondary | ICD-10-CM | POA: Diagnosis not present

## 2022-08-24 DIAGNOSIS — M6281 Muscle weakness (generalized): Secondary | ICD-10-CM | POA: Diagnosis not present

## 2022-08-24 DIAGNOSIS — Y793 Surgical instruments, materials and orthopedic devices (including sutures) associated with adverse incidents: Secondary | ICD-10-CM | POA: Diagnosis present

## 2022-08-24 DIAGNOSIS — Z8249 Family history of ischemic heart disease and other diseases of the circulatory system: Secondary | ICD-10-CM | POA: Diagnosis not present

## 2022-08-24 DIAGNOSIS — K760 Fatty (change of) liver, not elsewhere classified: Secondary | ICD-10-CM | POA: Diagnosis not present

## 2022-08-24 DIAGNOSIS — I509 Heart failure, unspecified: Secondary | ICD-10-CM | POA: Diagnosis not present

## 2022-08-24 DIAGNOSIS — G4733 Obstructive sleep apnea (adult) (pediatric): Secondary | ICD-10-CM | POA: Diagnosis not present

## 2022-08-24 DIAGNOSIS — R109 Unspecified abdominal pain: Secondary | ICD-10-CM | POA: Diagnosis not present

## 2022-08-24 DIAGNOSIS — Z743 Need for continuous supervision: Secondary | ICD-10-CM | POA: Diagnosis not present

## 2022-08-24 DIAGNOSIS — J449 Chronic obstructive pulmonary disease, unspecified: Secondary | ICD-10-CM | POA: Diagnosis not present

## 2022-08-24 DIAGNOSIS — M6259 Muscle wasting and atrophy, not elsewhere classified, multiple sites: Secondary | ICD-10-CM | POA: Diagnosis not present

## 2022-08-24 DIAGNOSIS — E039 Hypothyroidism, unspecified: Secondary | ICD-10-CM | POA: Diagnosis present

## 2022-08-24 DIAGNOSIS — I5032 Chronic diastolic (congestive) heart failure: Secondary | ICD-10-CM | POA: Diagnosis not present

## 2022-08-24 DIAGNOSIS — K5904 Chronic idiopathic constipation: Secondary | ICD-10-CM | POA: Diagnosis not present

## 2022-08-24 DIAGNOSIS — K921 Melena: Secondary | ICD-10-CM | POA: Diagnosis not present

## 2022-08-24 LAB — BASIC METABOLIC PANEL
Anion gap: 10 (ref 5–15)
BUN: 47 mg/dL — ABNORMAL HIGH (ref 8–23)
CO2: 24 mmol/L (ref 22–32)
Calcium: 7.7 mg/dL — ABNORMAL LOW (ref 8.9–10.3)
Chloride: 101 mmol/L (ref 98–111)
Creatinine, Ser: 0.87 mg/dL (ref 0.44–1.00)
GFR, Estimated: >60 mL/min (ref >60)
Glucose, Bld: 146 mg/dL — ABNORMAL HIGH (ref 70–99)
Potassium: 3.3 mmol/L — ABNORMAL LOW (ref 3.5–5.1)
Sodium: 135 mmol/L (ref 135–145)

## 2022-08-24 LAB — PROTIME-INR
INR: 4.9 (ref 0.8–1.2)
INR: 4 — ABNORMAL HIGH (ref 0.8–1.2)
Prothrombin Time: 45.8 seconds — ABNORMAL HIGH (ref 11.4–15.2)
Prothrombin Time: 39.3 seconds — ABNORMAL HIGH (ref 11.4–15.2)

## 2022-08-24 LAB — ABO/RH: ABO/RH(D): A POS

## 2022-08-24 LAB — ECHOCARDIOGRAM COMPLETE
AR max vel: 2.35 cm2
AV Area VTI: 2.18 cm2
AV Area mean vel: 2.3 cm2
AV Mean grad: 5 mmHg
AV Peak grad: 9 mmHg
Ao pk vel: 1.5 m/s
Area-P 1/2: 3.77 cm2
Calc EF: 65.8 %
Height: 66 in
MV VTI: 2.46 cm2
S' Lateral: 2.4 cm
Single Plane A2C EF: 66.2 %
Single Plane A4C EF: 66.9 %
Weight: 4511.49 oz

## 2022-08-24 LAB — PREPARE FRESH FROZEN PLASMA

## 2022-08-24 LAB — C-REACTIVE PROTEIN: CRP: 2.4 mg/dL — ABNORMAL HIGH (ref <1.0)

## 2022-08-24 LAB — CBC
HCT: 26.8 % — ABNORMAL LOW (ref 36.0–46.0)
Hemoglobin: 8.3 g/dL — ABNORMAL LOW (ref 12.0–15.0)
MCH: 25.8 pg — ABNORMAL LOW (ref 26.0–34.0)
MCHC: 31 g/dL (ref 30.0–36.0)
MCV: 83.2 fL (ref 80.0–100.0)
Platelets: 318 10*3/uL (ref 150–400)
RBC: 3.22 MIL/uL — ABNORMAL LOW (ref 3.87–5.11)
RDW: 17.5 % — ABNORMAL HIGH (ref 11.5–15.5)
WBC: 11 10*3/uL — ABNORMAL HIGH (ref 4.0–10.5)
nRBC: 0.2 % (ref 0.0–0.2)

## 2022-08-24 LAB — BPAM FFP: Blood Product Expiration Date: 202406112359

## 2022-08-24 LAB — GLUCOSE, CAPILLARY
Glucose-Capillary: 142 mg/dL — ABNORMAL HIGH (ref 70–99)
Glucose-Capillary: 185 mg/dL — ABNORMAL HIGH (ref 70–99)
Glucose-Capillary: 170 mg/dL — ABNORMAL HIGH (ref 70–99)

## 2022-08-24 LAB — HIV ANTIBODY (ROUTINE TESTING W REFLEX): HIV Screen 4th Generation wRfx: NONREACTIVE

## 2022-08-24 MED ORDER — VITAMIN K1 10 MG/ML IJ SOLN
2.0000 mg | Freq: Once | INTRAMUSCULAR | Status: AC
  Administered 2022-08-24: 2 mg via SUBCUTANEOUS
  Filled 2022-08-24: qty 0.2

## 2022-08-24 MED ORDER — SODIUM CHLORIDE 0.9% IV SOLUTION: Freq: Once | INTRAVENOUS | Status: AC

## 2022-08-24 MED ORDER — DILTIAZEM HCL ER COATED BEADS 240 MG PO CP24
240.0000 mg | ORAL_CAPSULE | Freq: Every day | ORAL | Status: DC
  Administered 2022-08-24 – 2022-09-01 (×9): 240 mg via ORAL
  Filled 2022-08-24 (×9): qty 1

## 2022-08-24 MED ORDER — POTASSIUM CHLORIDE CRYS ER 20 MEQ PO TBCR
40.0000 meq | EXTENDED_RELEASE_TABLET | Freq: Once | ORAL | Status: AC
  Administered 2022-08-24: 40 meq via ORAL
  Filled 2022-08-24: qty 2

## 2022-08-24 NOTE — H&P (View-Only) (Signed)
Subjective: She complains about being dizzy when standing up.  Objective: Vital signs in last 24 hours: Temp:  [98.2 F (36.8 C)-98.9 F (37.2 C)] 98.3 F (36.8 C) (06/06 1232) Pulse Rate:  [94-101] 99 (06/06 1235) Resp:  [16-20] 20 (06/06 1232) BP: (104-171)/(85-109) 104/85 (06/06 1235) SpO2:  [93 %-100 %] 100 % (06/06 1235) Weight:  [127.9 kg] 127.9 kg (06/05 2000) Last BM Date : 08/22/22  Intake/Output from previous day: 06/05 0701 - 06/06 0700 In: 240 [P.O.:240] Out: -  Intake/Output this shift: Total I/O In: 480 [P.O.:480] Out: -   General appearance: alert and no distress GI: soft, non-tender; bowel sounds normal; no masses,  no organomegaly  Lab Results: Recent Labs    08/23/22 1144 08/24/22 0539  WBC 9.1 11.0*  HGB 9.9* 8.3*  HCT 31.5* 26.8*  PLT 343 318   BMET Recent Labs    08/23/22 1144 08/24/22 0539  NA 140 135  K 3.5 3.3*  CL 104 101  CO2 27 24  GLUCOSE 153* 146*  BUN 35* 47*  CREATININE 0.82 0.87  CALCIUM 8.1* 7.7*   LFT Recent Labs    08/23/22 1144  PROT 7.7  ALBUMIN 3.4*  AST 16  ALT 19  ALKPHOS 65  BILITOT 0.1*   PT/INR Recent Labs    08/24/22 0539 08/24/22 1227  LABPROT 45.8* 39.3*  INR 4.9* 4.0*   Hepatitis Panel No results for input(s): "HEPBSAG", "HCVAB", "HEPAIGM", "HEPBIGM" in the last 72 hours. C-Diff No results for input(s): "CDIFFTOX" in the last 72 hours. Fecal Lactopherrin No results for input(s): "FECLLACTOFRN" in the last 72 hours.  Studies/Results: ECHOCARDIOGRAM COMPLETE  Result Date: 08/24/2022    ECHOCARDIOGRAM REPORT   Patient Name:   [name] Date of Exam: 08/24/2022 Medical Rec #:  [id number]           Height:       66.0 in Accession #:    [id number]          Weight:       282.0 lb Date of Birth:  03/29/1955           BSA:          2.314 m Patient Age:    67 years            BP:           132/90 mmHg Patient Gender: F                   HR:           100 bpm. Exam Location:  Inpatient  Procedure: 2D Echo, Cardiac Doppler and Color Doppler Indications:    Syncope  History:        Patient has prior history of Echocardiogram examinations, most                 recent 05/04/2020. CHF, COPD, Signs/Symptoms:Syncope and                 Dizziness/Lightheadedness; Risk Factors:Hypertension,                 Dyslipidemia, Diabetes, Hx DVT, substance abuse and Sleep Apnea.  Sonographer:    Molly Halloran Referring Phys: [id number] RIPUDEEP K RAI  Sonographer Comments: Patient is obese. Image acquisition challenging due to patient body habitus. IMPRESSIONS  1. Left ventricular ejection fraction, by estimation, is 65 to 70%. The left ventricle has normal function. The left ventricle has no regional wall motion abnormalities. Left   ventricular diastolic parameters are consistent with Grade I diastolic dysfunction (impaired relaxation). Elevated left ventricular end-diastolic pressure.  2. Right ventricular systolic function is normal. The right ventricular size is normal.  3. The mitral valve is normal in structure. No evidence of mitral valve regurgitation. No evidence of mitral stenosis.  4. The aortic valve was not well visualized. There is mild calcification of the aortic valve. Aortic valve regurgitation is not visualized. Aortic valve sclerosis is present, with no evidence of aortic valve stenosis.  5. The inferior vena cava is normal in size with greater than 50% respiratory variability, suggesting right atrial pressure of 3 mmHg. FINDINGS  Left Ventricle: Left ventricular ejection fraction, by estimation, is 65 to 70%. The left ventricle has normal function. The left ventricle has no regional wall motion abnormalities. The left ventricular internal cavity size was normal in size. There is  no left ventricular hypertrophy. Left ventricular diastolic parameters are consistent with Grade I diastolic dysfunction (impaired relaxation). Elevated left ventricular end-diastolic pressure. Right Ventricle: The right  ventricular size is normal. No increase in right ventricular wall thickness. Right ventricular systolic function is normal. Left Atrium: Left atrial size was normal in size. Right Atrium: Right atrial size was normal in size. Pericardium: There is no evidence of pericardial effusion. Mitral Valve: The mitral valve is normal in structure. No evidence of mitral valve regurgitation. No evidence of mitral valve stenosis. MV peak gradient, 8.0 mmHg. The mean mitral valve gradient is 2.0 mmHg. Tricuspid Valve: The tricuspid valve is normal in structure. Tricuspid valve regurgitation is not demonstrated. No evidence of tricuspid stenosis. Aortic Valve: The aortic valve was not well visualized. There is mild calcification of the aortic valve. Aortic valve regurgitation is not visualized. Aortic valve sclerosis is present, with no evidence of aortic valve stenosis. Aortic valve mean gradient measures 5.0 mmHg. Aortic valve peak gradient measures 9.0 mmHg. Aortic valve area, by VTI measures 2.18 cm. Pulmonic Valve: The pulmonic valve was normal in structure. Pulmonic valve regurgitation is not visualized. No evidence of pulmonic stenosis. Aorta: The aortic root is normal in size and structure. Venous: The inferior vena cava is normal in size with greater than 50% respiratory variability, suggesting right atrial pressure of 3 mmHg. IAS/Shunts: The interatrial septum was not well visualized.  LEFT VENTRICLE PLAX 2D LVIDd:         3.60 cm     Diastology LVIDs:         2.40 cm     LV e' medial:    4.56 cm/s LV PW:         1.00 cm     LV E/e' medial:  15.1 LV IVS:        1.00 cm     LV e' lateral:   4.72 cm/s LVOT diam:     1.90 cm     LV E/e' lateral: 14.6 LV SV:         48 LV SV Index:   21 LVOT Area:     2.84 cm  LV Volumes (MOD) LV vol d, MOD A2C: 71.5 ml LV vol d, MOD A4C: 80.4 ml LV vol s, MOD A2C: 24.2 ml LV vol s, MOD A4C: 26.6 ml LV SV MOD A2C:     47.3 ml LV SV MOD A4C:     80.4 ml LV SV MOD BP:      50.2 ml RIGHT  VENTRICLE RV S prime:     29.00 cm/s TAPSE (M-mode): 1.7 cm LEFT   ATRIUM             Index        RIGHT ATRIUM           Index LA diam:        3.10 cm 1.34 cm/m   RA Area:     10.40 cm LA Vol (A2C):   40.8 ml 17.63 ml/m  RA Volume:   21.80 ml  9.42 ml/m LA Vol (A4C):   36.5 ml 15.77 ml/m LA Biplane Vol: 41.1 ml 17.76 ml/m  AORTIC VALVE AV Area (Vmax):    2.35 cm AV Area (Vmean):   2.30 cm AV Area (VTI):     2.18 cm AV Vmax:           150.00 cm/s AV Vmean:          97.600 cm/s AV VTI:            0.220 m AV Peak Grad:      9.0 mmHg AV Mean Grad:      5.0 mmHg LVOT Vmax:         124.50 cm/s LVOT Vmean:        79.250 cm/s LVOT VTI:          0.169 m LVOT/AV VTI ratio: 0.77  AORTA Ao Root diam: 3.20 cm Ao Asc diam:  3.30 cm MITRAL VALVE               TRICUSPID VALVE MV Area (PHT): 3.77 cm    TR Peak grad:   25.8 mmHg MV Area VTI:   2.46 cm    TR Vmax:        254.00 cm/s MV Peak grad:  8.0 mmHg MV Mean grad:  2.0 mmHg    SHUNTS MV Vmax:       1.41 m/s    Systemic VTI:  0.17 m MV Vmean:      72.0 cm/s   Systemic Diam: 1.90 cm MV Decel Time: 201 msec MV E velocity: 68.80 cm/s MV A velocity: 98.90 cm/s MV E/A ratio:  0.70 Peter Nishan MD Electronically signed by Peter Nishan MD Signature Date/Time: 08/24/2022/12:24:16 PM    Final    CT Head Wo Contrast  Result Date: 08/23/2022 CLINICAL DATA:  Syncope/presyncope, cerebrovascular cause suspected EXAM: CT HEAD WITHOUT CONTRAST TECHNIQUE: Contiguous axial images were obtained from the base of the skull through the vertex without intravenous contrast. RADIATION DOSE REDUCTION: This exam was performed according to the departmental dose-optimization program which includes automated exposure control, adjustment of the mA and/or kV according to patient size and/or use of iterative reconstruction technique. COMPARISON:  CT head 07/31/22 FINDINGS: Brain: No evidence of acute infarction, hemorrhage, hydrocephalus, extra-axial collection or mass lesion/mass effect. 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 Vascular: No hyperdense vessel or unexpected calcification. Skull: Normal. Negative for fracture or focal lesion. Sinuses/Orbits: No middle ear or mastoid effusion. Paranasal sinuses are clear. Bilateral lens replacement. Orbits are otherwise unremarkable. Other: None. IMPRESSION: 1. No acute intracranial abnormality. 2. 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   On: 08/23/2022 14:16   DG Knee Complete 4 Views Left  Result Date: 08/23/2022 CLINICAL DATA:  Weakness and dizziness.  Left knee pain. EXAM: LEFT KNEE - COMPLETE 4+ VIEW COMPARISON:  10/07/2016 FINDINGS: Status post left total knee arthroplasty. Small suprapatellar joint effusion is   suspected. No signs of periprosthetic fracture, dislocation or loosening. Soft tissues are unremarkable. IMPRESSION: 1. Status post left total knee arthroplasty. No signs of periprosthetic fracture, loosening or dislocation 2. Small suprapatellar joint effusion. Electronically Signed   By: Taylor  Stroud M.D.   On: 08/23/2022 12:08   DG Chest Port 1 View  Result Date: 08/23/2022 CLINICAL DATA:  weak EXAM: PORTABLE CHEST 1 VIEW COMPARISON:  CXR 02/18/21 FINDINGS: Borderline cardiomegaly. No pleural effusion. No pneumothorax. No focal airspace opacity. No radiographically apparent displaced rib fractures. Visualized upper abdomen is unremarkable. Partially visualized cervical spinal fusion hardware in place. IMPRESSION: No focal airspace opacity.  Borderline cardiomegaly. Electronically Signed   By: Hemant  Desai M.D.   On: 08/23/2022 12:05    Medications: Scheduled:  sodium chloride   Intravenous Once   diltiazem  240 mg Oral QAC breakfast   DULoxetine  90 mg Oral QHS   fluticasone  2 spray Each Nare BID   gabapentin  300 mg Oral 2 times per day   insulin aspart  0-15  Units Subcutaneous TID WC   insulin aspart  0-5 Units Subcutaneous QHS   lidocaine  1 patch Transdermal Q24H   linaclotide  145 mcg Oral QODAY   methocarbamol  500 mg Oral TID   mometasone-formoterol  2 puff Inhalation BID   pantoprazole  40 mg Oral BID AC   pravastatin  40 mg Oral QPM   QUEtiapine  50 mg Oral BID   Continuous:  Assessment/Plan: 1) Melena. 2) Anemia. 3) Supratherapeutic INR.   She reports feeling well outside of standing up.  There was a reports that she had melena.  Hemodynamically she appears stable.  Her last INR was at 4.0.  Plan: 1) EGD tomorrow. 2) Monitor INR. 3) Maintain pantoprazole. 4) Transfuse if necessary.  LOS: 0 days   Sible Straley D 08/24/2022, 3:57 PM 

## 2022-08-24 NOTE — TOC CM/SW Note (Deleted)
Transition of Care [facility]) - Inpatient Brief Assessment   Patient Details  Name: [name] MRN: [id number] Date of Birth: 1955-08-19  Transition of Care [facility]) CM/SW Contact:    Durenda Guthrie, RN Phone Number: 08/24/2022, 9:12 AM   Clinical Narrative:    Transition of Care Asessment:   Patient has primary care physician: Yes (Dr. Abbe Amsterdam) Home environment has been reviewed: lives alone Prior level of function:: independent Prior/Current Home Services: Current home services (has aide that comes to assist) Social Determinants of Health Reivew: SDOH reviewed no interventions necessary Readmission risk has been reviewed: Yes

## 2022-08-24 NOTE — Progress Notes (Addendum)
[facility]                                                                              [name], is a 67 y.o. 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  Patient reported dizziness that started a day before admission and left knee almost gave out.  She has been having left knee pain and has been operated on previously by Dr. August Saucer.  On the day of admission, she felt more dizzy, diaphoretic, no chest pain abdominal pain or any headaches.  She continued to have constant knee pain, called Dr. Diamantina Providence office and had an appointment however could not make it to the appointment due to dizziness.   Patient denies taking NSAIDs for the knee pain.  Takes PPI for GERD  No nausea, vomiting, abdominal pain or diarrhea.  Patient takes Coumadin for history of DVTs, followed by Dr. Alcide Evener.  Per patient, she was having low INR hence she had recently increased her Coumadin.  In ED, temp 98.3, RR 27, heart rate 93, BP initially 133/82, then 111/97, O2 sats 97% on room air CMET unremarkable, CBC showed hemoglobin 9.9, was 11.6 on 06/22/2022 INR 7.0 FOBT positive   Assessment & Plan    Principal Problem:   Near syncope -Patient noted to be borderline BP on multiple antihypertensives outpatient (clonidine, Cardizem, Lasix, Aldactone), held on admission.   -Patient received 500 cc IVF bolus  -Orthostatic vitals still pending -Troponins x 2 negative.  EKG did not show acute ST-T wave changes suggestive of ischemia.  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  GI bleed -FOBT positive with supratherapeutic INR.  Patient has history of GERD however denies taking any NSAIDs -Baseline hemoglobin 11- 12, Hb on admission 9.9, trended down to 8.3  -Continue PPI twice daily -Seen by GI, Dr. Loreta Ave, no need for endoscopy or  intervention.   -GI consulted again for evaluation, H&H trending down, black tarry stool (has been on iron).  Discussed with Dr. Loreta Ave, Dr. Elnoria Howard to see pt.     Chronic diastolic CHF (congestive heart  failure) (HCC) -Previous echo in 04/2020 showed EF of 60 to 65%, G1 DD -Currently euvolemic - due to near syncope, borderline BP, holding diuretics including Lasix, Aldactone -Requested cardiology consult       Mixed hyperlipidemia -Continue statin   Acute on chronic left knee pain  -Patient has a prior history of left knee arthroplasty.  X-ray showed small suprapatellar joint effusion, no signs of periprosthetic fracture loosening or dislocation -D/w Dr. August Saucer, INR too high for arthrocentesis or lidocaine injection.   -CRP 2.4, ESR 81, uric acid 6.1 -Continue tramadol, Robaxin, gabapentin, IV Dilaudid as needed for severe pain -Once INR in therapeutic range, will request orthopedic evaluation. -PT OT evaluation once able to ambulate  Obesity Estimated body mass index is 45.51 kg/m as calculated from the following:   Height as of this encounter: 5\' 6"  (1.676 m).    Weight as of this encounter: 127.9 kg.  Code Status: Full code DVT Prophylaxis:  SCDs Start: 08/23/22 1948   Level of Care: Level of care: Telemetry Family Communication: Updated patient Disposition Plan:      Remains inpatient appropriate:   Hopefully tomorrow once dizziness is improved and patient able to tolerate PT.   Procedures:    Consultants:   GI Orthopedics Cardiology  Antimicrobials: None   Medications  DULoxetine  90 mg Oral QHS   fluticasone  2 spray Each Nare BID   gabapentin  300 mg Oral 2 times per day   insulin aspart  0-15 Units Subcutaneous TID WC   insulin aspart  0-5 Units Subcutaneous QHS   lidocaine  1 patch Transdermal Q24H   linaclotide  145 mcg Oral QODAY   methocarbamol  500 mg Oral TID   mometasone-formoterol  2 puff Inhalation BID   pantoprazole  40 mg Oral BID AC   pravastatin  40 mg Oral QPM   QUEtiapine  50 mg Oral BID      Subjective:   [name] was seen and examined today.  Complaining of persistent dizziness.  Also still having acute left knee pain.  No nausea vomiting, diarrhea, abdominal pain.  No acute events overnight. Objective:   Vitals:   08/23/22 2134 08/24/22 0146 08/24/22 0543 08/24/22 0933  BP: (!) 167/94 (!) 140/88 (!) 132/90   Pulse: 97 94 96   Resp: 16 16 16    Temp: 98.9 F (37.2 C) 98.3 F (36.8 C) 98.2 F (36.8 C)   TempSrc: Oral Oral Oral   SpO2: 93% 99% 98% 97%  Weight:      Height:        Intake/Output Summary (Last 24 hours) at 08/24/2022 1055 Last data filed at 08/23/2022 2000 Gross per 24 hour  Intake 240 ml  Output --  Net 240 ml     Wt Readings from Last 3 Encounters:  08/23/22 127.9 kg  07/29/22 127.9 kg  07/27/22 131.1 kg     Exam General: Alert and oriented x 3, NAD Cardiovascular: S1 S2 auscultated,  RRR Respiratory: Clear to auscultation bilaterally, no wheezing Gastrointestinal: Soft, nontender, nondistended, + bowel sounds Ext: no pedal edema bilaterally Neuro: strength 5/5  in upper extremities, 5/5 RLE, difficult to bend left knee Psych: Normal affect     Data Reviewed:  I have personally reviewed following labs    CBC Lab Results  Component Value Date   WBC 11.0 (H) 08/24/2022   RBC 3.22 (L) 08/24/2022   HGB 8.3 (L) 08/24/2022   HCT 26.8 (L) 08/24/2022   MCV 83.2 08/24/2022  MCH 25.8 (L) 08/24/2022   PLT 318 08/24/2022   MCHC 31.0 08/24/2022   RDW 17.5 (H) 08/24/2022   LYMPHSABS 2.2 08/23/2022   MONOABS 0.7 08/23/2022   EOSABS 0.1 08/23/2022   BASOSABS 0.0 08/23/2022     Last metabolic panel Lab Results  Component Value Date   NA 135 08/24/2022   K 3.3 (L) 08/24/2022   CL 101 08/24/2022   CO2 24 08/24/2022   BUN 47 (H) 08/24/2022   CREATININE 0.87 08/24/2022   GLUCOSE 146 (H) 08/24/2022   GFRNONAA >60 08/24/2022   GFRAA 94 03/05/2020   CALCIUM 7.7 (L) 08/24/2022   PHOS 2.7 06/12/2014   PROT 7.7 08/23/2022   ALBUMIN 3.4 (L) 08/23/2022   BILITOT 0.1 (L) 08/23/2022   ALKPHOS 65 08/23/2022   AST 16 08/23/2022   ALT 19 08/23/2022   ANIONGAP 10 08/24/2022    CBG (last 3)  Recent Labs    08/23/22 1840 08/23/22 2032  GLUCAP 138* 179*      Coagulation Profile: Recent Labs  Lab 08/23/22 1144 08/23/22 2117 08/24/22 0539  INR 7.0* 6.2* 4.9*     Radiology Studies: I have personally reviewed the imaging studies  CT Head Wo Contrast  Result Date: 08/23/2022 CLINICAL DATA:  Syncope/presyncope, cerebrovascular cause suspected EXAM: CT HEAD WITHOUT CONTRAST TECHNIQUE: Contiguous axial images were obtained from the base of the skull through the vertex without intravenous contrast. RADIATION DOSE REDUCTION: This exam was performed according to the departmental dose-optimization program which includes automated exposure control, adjustment of the mA and/or kV according to patient size and/or use of iterative reconstruction technique. COMPARISON:  CT head 07/31/22 FINDINGS: Brain: No evidence of acute infarction, hemorrhage,  hydrocephalus, extra-axial collection or mass lesion/mass effect. 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 Vascular: No hyperdense vessel or unexpected calcification. Skull: Normal. Negative for fracture or focal lesion. Sinuses/Orbits: No middle ear or mastoid effusion. Paranasal sinuses are clear. Bilateral lens replacement. Orbits are otherwise unremarkable. Other: None. IMPRESSION: 1. No acute intracranial abnormality. 2. 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   On: 08/23/2022 14:16   DG Knee Complete 4 Views Left  Result Date: 08/23/2022 CLINICAL DATA:  Weakness and dizziness.  Left knee pain. EXAM: LEFT KNEE - COMPLETE 4+ VIEW COMPARISON:  10/07/2016 FINDINGS: Status post left total knee arthroplasty. Small suprapatellar joint effusion is suspected. No signs of periprosthetic fracture, dislocation or loosening. Soft tissues are unremarkable. IMPRESSION: 1. Status post left total knee arthroplasty. No signs of periprosthetic fracture, loosening or dislocation 2. Small suprapatellar joint effusion. Electronically Signed   By: Signa Kell M.D.   On: 08/23/2022 12:08   DG Chest Port 1 View  Result Date: 08/23/2022 CLINICAL DATA:  weak EXAM: PORTABLE CHEST 1 VIEW COMPARISON:  CXR 02/18/21 FINDINGS: Borderline cardiomegaly. No pleural effusion. No pneumothorax. No focal airspace opacity. No radiographically apparent displaced rib fractures. Visualized upper abdomen is unremarkable. Partially visualized cervical spinal fusion hardware in place. IMPRESSION: No focal airspace opacity.  Borderline cardiomegaly. Electronically Signed   By: Lorenza Cambridge M.D.   On: 08/23/2022 12:05       Calem Cocozza M.D. [facility] 08/24/2022, 10:55 AM  Available via Epic secure chat 7am-7pm After 7 pm, please refer to night coverage provider  listed on amion.

## 2022-08-24 NOTE — Evaluation (Addendum)
Physical Therapy Evaluation Patient Details Name: [name] MRN: [id number] DOB: 04/25/1955 Today's Date: 08/24/2022  History of Present Illness  Pt is an 67 y/o F admitted on 08/23/23 after presenting with c/o dizziness & L knee pain. Pt is being treated for near syncope. PMH: DM2, COPD, DVTs on coumadin, clotting disorder, GERD, HTN, HLP, OSA, peripheral neuropathy, heart murmr  Clinical Impression  Pt seen for PT evaluation with pt agreeable to tx. Pt reports prior to admission she was living alone with a PCA aide up to 3 hours/day x 7 days/week. Pt notes she walks with rollator & denies falls. On this date, pt is able to complete supine>sit with min assist, sit>supine with supervision. Pt tolerated sitting EOB ~3 minutes but reports worsening lightheadedness/dizziness so returned supine. PT assisted pt with performing LLE heel slides within tolerance x 10 reps. Continue to recommend acute PT services to address strengthening & activity tolerance & progress mobility as able.  BP checked in LUE  BP HR  Supine  150/82 mmHg MAP 102 96 bpm  Sitting 134/78 mmHg MAP 87 106 bpm  Returned to supine 129/64 mmHg MAP 84 105 bpm  Semi fowler in bed at end of session 160/90 mmHg MAP 108 105 bpm          Recommendations for follow up therapy are one component of a multi-disciplinary discharge planning process, led by the attending physician.  Recommendations may be updated based on patient status, additional functional criteria and insurance authorization.  Follow Up Recommendations       Assistance Recommended at Discharge Intermittent Supervision/Assistance  Patient can return home with the following  A little help with walking and/or transfers;A little help with bathing/dressing/bathroom;Assistance with cooking/housework;Assist for transportation;Help with stairs or ramp for entrance    Equipment Recommendations None recommended by PT  Recommendations for Other Services    OT consult    Functional Status Assessment Patient has had a recent decline in their functional status and demonstrates the ability to make significant improvements in function in a reasonable and predictable amount of time.     Precautions / Restrictions Precautions Precautions: Fall Precaution Comments: watch BP Restrictions Weight Bearing Restrictions: No      Mobility  Bed Mobility Overal bed mobility: Needs Assistance Bed Mobility: Supine to Sit, Sit to Supine     Supine to sit: Min assist, HOB elevated Sit to supine: Supervision, HOB elevated   General bed mobility comments: extra time    Transfers                        Ambulation/Gait                  Stairs            Wheelchair Mobility    Modified Rankin (Stroke Patients Only)       Balance Overall balance assessment: Needs assistance Sitting-balance support: Feet supported Sitting balance-Leahy Scale: Good                                       Pertinent Vitals/Pain Pain Assessment Pain Assessment: Faces Pain Score: 10-Worst pain ever Pain Location: L knee Pain Descriptors / Indicators: Discomfort Pain Intervention(s): Monitored during session, Limited activity within patient's tolerance (pt with lidocaine patch on L knee)    Home Living Family/patient expects to be discharged to:: Private residence Living Arrangements:  Alone Available Help at Discharge: Personal care attendant Type of Home: Apartment Home Access: Level entry       Home Layout: One level Home Equipment: Rollator (4 wheels) Additional Comments: Pt reports having a PCA Monday-Friday 12:15-3:15pm, Saturday 8-10am, Sunday 8-9:30am who assists with bathing & cleaning.    Prior Function               Mobility Comments: Pt reports she's ambulatory with rollator, denies falls. Pt notes she drives some but has cut back a lot 2/2 hx of seizures (notes they will occur if she's laughing too much). ADLs  Comments: Pt reports she's able to toilet & dress without assistance.     Hand Dominance        Extremity/Trunk Assessment   Upper Extremity Assessment Upper Extremity Assessment: Overall WFL for tasks assessed    Lower Extremity Assessment Lower Extremity Assessment: LLE deficits/detail LLE Deficits / Details: Pt unable to flex against gravity when in bed without assistance, nor perform LAQ sitting EOB without assistance 2/2 pain. Pt is able to perform quad set in bed. Pt with c/o L knee pain with L ankle passively dorsiflexed.       Communication   Communication: No difficulties  Cognition Arousal/Alertness: Awake/alert Behavior During Therapy: WFL for tasks assessed/performed Overall Cognitive Status: Within Functional Limits for tasks assessed                                 General Comments: motivated to participate, eager to return home        General Comments      Exercises     Assessment/Plan    PT Assessment Patient needs continued PT services  PT Problem List Decreased strength;Decreased range of motion;Decreased balance;Decreased mobility;Pain;Decreased activity tolerance;Decreased knowledge of use of DME;Decreased safety awareness       PT Treatment Interventions DME instruction;Therapeutic exercise;Gait training;Balance training;Stair training;Neuromuscular re-education;Functional mobility training;Therapeutic activities;Patient/family education    PT Goals (Current goals can be found in the Care Plan section)  Acute Rehab PT Goals Patient Stated Kuan: get better, return home PT Arch Formulation: With patient Time For Inabinet Achievement: 09/07/22 Potential to Achieve Goals: Good    Frequency Min 3X/week     Co-evaluation               AM-PAC PT "6 Clicks" Mobility  Outcome Measure Help needed turning from your back to your side while in a flat bed without using bedrails?: A Little Help needed moving from lying on your back to  sitting on the side of a flat bed without using bedrails?: A Little Help needed moving to and from a bed to a chair (including a wheelchair)?: A Lot Help needed standing up from a chair using your arms (e.g., wheelchair or bedside chair)?: A Lot Help needed to walk in hospital room?: Total Help needed climbing 3-5 steps with a railing? : Total 6 Click Score: 12    End of Session   Activity Tolerance:  (limited 2/2 L knee pain & weakness & pt c/o lightheadedness/dizziness with sitting EOB) Patient left: in bed;with call bell/phone within reach;with bed alarm set   PT Visit Diagnosis: Pain;Muscle weakness (generalized) (M62.81);Other abnormalities of gait and mobility (R26.89);Difficulty in walking, not elsewhere classified (R26.2) Pain - Right/Left: Left Pain - part of body: Knee    Time: 1355-1418 PT Time Calculation (min) (ACUTE ONLY): 23 min   Charges:   PT  Evaluation $PT Eval Moderate Complexity: 1 Mod          Aleda Grana, PT, DPT 08/24/22, 2:41 PM   Sandi Mariscal 08/24/2022, 2:37 PM

## 2022-08-24 NOTE — Progress Notes (Addendum)
ORTHOPAEDIC CONSULTATION  REQUESTING PHYSICIAN: Cathren Harsh, MD  Chief Complaint: "My left knee hurts"  HPI: [name] is a 67 y.o. female who presents with left knee pain.  Has been ongoing since Tuesday.  States that she got out of her car and felt like her knee gave out on her and she became dizzy.  She has increased pain in the left knee ever since this event.  She had denies any fevers or chills.  She has history of total knee replacement that was done by Dr. August Saucer about a little over a decade ago.  She denies any groin pain or radicular pain.  Has difficulty putting weight on her left lower extremity.  Denies any change in the appearance of the incision.  Localizes pain diffusely around the knee.  No increased warmth.  Past Medical History:  Diagnosis Date   Adrenal mass (HCC) 03/226   Benign   Arthritis    knees/multiple orthopedic conditons; lower back   Asthma    per pt   Clotting disorder (HCC)    +beta-2-glycoprotein IgA antibody   COPD (chronic obstructive pulmonary disease) (HCC)    inhalers dependent on environment   Depression    Diabetes mellitus    120s usually fasting -  dx more than 10 yrs ago   Dizziness, nonspecific    DVT (deep venous thrombosis) (HCC)    Recurrent   Fibromyalgia    GERD (gastroesophageal reflux disease)    Heart murmur    History of blood transfusion    History of cocaine abuse (HCC)    Remote history    Hyperlipidemia    Hypertension    takes meds daily   Hypothyroidism    Lupus Anticoagulant Positive    On home oxygen therapy    at night   Shortness of breath    exertion or lying flat   Sleep apnea    2l of oxygen at night (as of 12/6, she used to)   Past Surgical History:  Procedure Laterality Date   ABDOMINAL HYSTERECTOMY  1989   Fibroids   ANTERIOR LUMBAR FUSION  01/03/2012   Procedure: ANTERIOR LUMBAR FUSION 1 LEVEL;  Surgeon: Carmela Hurt, MD;  Location: [facility];  Service: Neurosurgery;  Laterality:  N/A;  Lumbar Four-Five Anterior Lumbar Interbody Fusion with Instrumentation   BACK SURGERY     cervical spine---disk disease   CARPAL TUNNEL RELEASE     Bilateral   CESAREAN SECTION     CHOLECYSTECTOMY     CYSTOSCOPY/RETROGRADE/URETEROSCOPY/STONE EXTRACTION WITH BASKET Right 04/26/2014   Procedure: CYSTOSCOPY/ RIGHT RETROGRADE/ RIGHT URETEROSCOPY/URETERAL AND RENAL PELVIS BIOPSY;  Surgeon: Sebastian Ache, MD;  Location: [facility];  Service: Urology;  Laterality: Right;   gallstones removed     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   bilateral   right elbow surgery     RIGHT HEART CATH N/A 02/28/2021   Procedure: RIGHT HEART CATH;  Surgeon: Kathleene Hazel, MD;  Location: [facility];  Service: Cardiovascular;  Laterality: N/A;   THYROID LOBECTOMY Right 02/27/2017   THYROID LOBECTOMY Right 02/27/2017   Procedure: RIGHT THYROID LOBECTOMY;  Surgeon: Harriette Bouillon, MD;  Location: [facility];  Service: General;  Laterality: Right;   TUBAL LIGATION     Social History   Socioeconomic History   Marital status: Divorced    Spouse name: Not on file   Number of children: Not on file   Years of education: Not on file  Highest education level: Not on file  Occupational History   Not on file  Tobacco Use   Smoking status: Never   Smokeless tobacco: Never  Vaping Use   Vaping Use: Never used  Substance and Sexual Activity   Alcohol use: No    Alcohol/week: 0.0 standard drinks of alcohol    Comment: beer in past    Drug use: Yes    Types: Cocaine    Comment: quit 2003-rehab progam in 2005   Sexual activity: Never  Other Topics Concern   Not on file  Social History Narrative   Lives alone in apartment, drives   Social Determinants of Health   Financial Resource Strain: Low Risk  (03/03/2022)   Overall Financial Resource Strain (CARDIA)    Difficulty of Paying Living Expenses: Not hard at all  Food Insecurity: No Food Insecurity (08/23/2022)   Hunger Vital Sign    Worried  About Running Out of Food in the Last Year: Never true    Ran Out of Food in the Last Year: Never true  Transportation Needs: No Transportation Needs (08/23/2022)   PRAPARE - Administrator, Civil Service (Medical): No    Lack of Transportation (Non-Medical): No  Physical Activity: Insufficiently Active (03/03/2022)   Exercise Vital Sign    Days of Exercise per Week: 7 days    Minutes of Exercise per Session: 10 min  Stress: No Stress Concern Present (03/03/2022)   [employer] of Occupational Health - Occupational Stress Questionnaire    Feeling of Stress : Not at all  Social Connections: Moderately Integrated (03/03/2022)   Social Connection and Isolation Panel [NHANES]    Frequency of Communication with Friends and Family: More than three times a week    Frequency of Social Gatherings with Friends and Family: More than three times a week    Attends Religious Services: More than 4 times per year    Active Member of [employer] or Organizations: Yes    Attends Engineer, structural: More than 4 times per year    Marital Status: Divorced   Family History  Problem Relation Age of Onset   Hypertension Mother    Diabetes Mother    Cancer Mother        Pancreatic   Hypertension Father    Heart failure Father    Hyperlipidemia Other    COPD Other    - negative except otherwise stated in the family history section Allergies  Allergen Reactions   Lisinopril Anaphylaxis, Swelling and Other (See Comments)    THROAT SWELLING  Pt states her throat started to "close up" after being on it for "so long."  THROAT SWELLING, Pt states her throat started to "close up" after being on it for "so long."   Prednisone Anaphylaxis, Swelling and Other (See Comments)    Throat swelling and tongue irritation   Spironolactone Other (See Comments)    Caused stroke-like symptoms with the mouth   Corticosteroids Rash    States she developed a rash on her tongue after a steroid  injection in her back   Prior to Admission medications   Medication Sig Start Date End Date Taking? Authorizing Provider  albuterol (PROVENTIL) (2.5 MG/3ML) 0.083% nebulizer solution USE 1 VIAL IN NEBULIZER EVERY 6 HOURS - and as needed Patient taking differently: Take 2.5 mg by nebulization every 6 (six) hours as needed for wheezing or shortness of breath. 08/11/21  Yes [name], MD  albuterol (VENTOLIN HFA) 108 (90  Base) MCG/ACT inhaler INHALE TWO PUFFS BY MOUTH INTO LUNGS every FOUR hours AS NEEDED FOR SHORTNESS OF BREATH Patient taking differently: Inhale 2 puffs into the lungs every 4 (four) hours as needed for shortness of breath. 04/21/22  Yes [name], MD  cloNIDine (CATAPRES) 0.1 MG tablet TAKE ONE TABLET BY MOUTH BEFORE BREAKFAST and TAKE ONE TABLET BY MOUTH EVERYDAY AT BEDTIME Patient taking differently: Take 0.1 mg by mouth See admin instructions. Take 0.1 mg by mouth before breakfast and at bedtime 04/14/22  Yes [name], MD  diclofenac sodium (VOLTAREN) 1 % GEL Apply 2 g topically daily as needed (fibromyalgia pain- affected sites).   Yes [provider]  diltiazem (CARDIZEM CD) 240 MG 24 hr capsule TAKE ONE CAPSULE BY MOUTH BEFORE BREAKFAST Patient taking differently: Take 240 mg by mouth daily before breakfast. 11/15/21  Yes [name], MD  DULoxetine (CYMBALTA) 30 MG capsule Take 90 mg by mouth at bedtime.   Yes [provider]  FEROSUL 325 (65 Fe) MG tablet TAKE ONE TABLET BY MOUTH ONCE DAILY Patient taking differently: Take 325 mg by mouth daily with breakfast. 07/19/21  Yes [name], MD  fexofenadine (ALLEGRA) 180 MG tablet TAKE ONE TABLET BY MOUTH ONCE DAILY Patient taking differently: Take 180 mg by mouth daily. 07/24/22  Yes [name], MD  fluticasone (FLONASE) 50 MCG/ACT nasal spray Place 2 sprays into both nostrils 2 (two) times daily. Patient taking differently: Place 2 sprays into both nostrils 2 (two) times daily as  needed for allergies or rhinitis. 06/22/22  Yes [name], MD  furosemide (LASIX) 80 MG tablet TAKE ONE TABLET BY MOUTH TWICE DAILY Patient taking differently: Take 80 mg by mouth 2 (two) times daily. 02/14/22  Yes Chandrasekhar, [name] A, MD  gabapentin (NEURONTIN) 300 MG capsule TAKE ONE CAPSULE BY MOUTH TWICE DAILY Patient taking differently: Take 300 mg by mouth See admin instructions. Take 300 mg by mouth at 6 PM and at bedtime 01/13/22  Yes Banks, Bettey Mare, MD  INVOKANA 100 MG TABS tablet TAKE ONE TABLET BY MOUTH BEFORE BREAKFAST Patient taking differently: Take 100 mg by mouth daily before breakfast. 11/15/21  Yes Chandrasekhar, [name] A, MD  lidocaine (LIDODERM) 5 % Place 1 patch onto the skin daily. Remove & Discard patch within 12 hours or as directed by MD Patient taking differently: Place 1 patch onto the skin daily as needed (to painful site- Remove & Discard patch within 12 hours or as directed by MD). 03/22/22  Yes Blue, Soijett A, PA-C  LINZESS 145 MCG CAPS capsule TAKE ONE CAPSULE BY MOUTH every other DAY Patient taking differently: Take 145 mcg by mouth every other day. 02/14/22  Yes [name], MD  metFORMIN (GLUCOPHAGE) 500 MG tablet TAKE ONE TABLET BY MOUTH EVERYDAY AT BEDTIME Patient taking differently: Take 500 mg by mouth at bedtime. 11/15/21  Yes [name], MD  methocarbamol (ROBAXIN) 500 MG tablet Take 1 tablet (500 mg total) by mouth 2 (two) times daily. Patient taking differently: Take 500 mg by mouth 2 (two) times daily as needed for muscle spasms. 03/22/22  Yes Blue, Soijett A, PA-C  omeprazole (PRILOSEC) 20 MG capsule TAKE ONE CAPSULE BY MOUTH BEFORE BREAKFAST and TAKE ONE CAPSULE BY MOUTH EVERY EVENING Patient taking differently: Take 20 mg by mouth See admin instructions. Take 20 mg by mouth before breakfast and in the evening 06/14/22  Yes [name], MD  potassium chloride (MICRO-K) 10  MEQ CR capsule TAKE TWO CAPSULES BY MOUTH EVERYDAY AT  BEDTIME Patient taking differently: Take 20 mEq by mouth at bedtime. TAKE TWO CAPSULES BY MOUTH EVERYDAY AT BEDTIME 04/14/22  Yes [name], MD  pravastatin (PRAVACHOL) 40 MG tablet TAKE ONE TABLET BY MOUTH EVERY EVENING Patient taking differently: Take 40 mg by mouth every evening. 04/14/22  Yes [name], MD  QUEtiapine (SEROQUEL) 50 MG tablet Take 50 mg by mouth in the morning and at bedtime.   Yes [provider]  Semaglutide, 1 MG/DOSE, 4 MG/3ML SOPN Inject 1 mg as directed once a week. Patient taking differently: Inject 1 mg into the skin every Saturday. 04/20/22  Yes [name], MD  SYMBICORT 160-4.5 MCG/ACT inhaler INHALE TWO PUFFS BY MOUTH INTO LUNGS twice daily Patient taking differently: Inhale 2 puffs into the lungs 2 (two) times daily. 04/14/22  Yes [name], MD  traMADol (ULTRAM) 50 MG tablet Take 1 tablet (50 mg total) by mouth every 12 (twelve) hours as needed. Patient taking differently: Take 50 mg by mouth every 12 (twelve) hours as needed (for pain). 04/20/22  Yes [name], MD  Vitamin D, Ergocalciferol, (DRISDOL) 1.25 MG (50000 UNIT) CAPS capsule Take 1 capsule (50,000 Units total) by mouth every 7 (seven) days. 07/21/22  Yes [name], MD  warfarin (COUMADIN) 10 MG tablet TAKE ONE TABLET BY MOUTH daily EXCEPT ON Monday, Wednesday, AND Friday OR AS DIRECTED Patient taking differently: Take 10 mg by mouth See admin instructions. Take 10 mg by mouth at bedtime on Sun/Tues/Thurs/Sat 06/15/22  Yes Ladene Artist, MD  warfarin (COUMADIN) 7.5 MG tablet TAKE ONE TABLET BY MOUTH ON Monday, Wednesdays, AND Fridays Patient taking differently: Take 7.5 mg by mouth See admin instructions. Take 7.5 mg by mouth at bedtime on Mon/Wed/Fri 05/15/22  Yes Ladene Artist, MD  zonisamide (ZONEGRAN) 100 MG capsule Take 100-200 mg by mouth See admin instructions. Take 100 mg by mouth in the morning and 200 mg in the evening 11/24/21  Yes [provider]  ACCU-CHEK GUIDE test strip USE TO check blood glucose UP TO four times daily AS DIRECTED 04/14/22   [name], MD  Accu-Chek Softclix Lancets lancets USE TO check blood glucose UP TO four times daily AS DIRECTED 07/24/22   [name], MD  blood glucose meter kit and supplies KIT Dispense based on patient and insurance preference. Use up to four times daily as directed. (FOR ICD-9 250.00, 250.01). 07/16/19   [name], MD  Incontinence Supply Disposable (PROCARE BARIATRIC BRIEFS) MISC Use as needed. 04/20/22   [name], MD  spironolactone (ALDACTONE) 25 MG tablet TAKE 1/2 TABLET BY MOUTH ONCE DAILY Patient not taking: Reported on 08/23/2022 01/12/22   Christell Constant, MD   ECHOCARDIOGRAM COMPLETE  Result Date: 08/24/2022    ECHOCARDIOGRAM REPORT   Patient Name:   [facility] Pallone Date of Exam: 08/24/2022 Medical Rec #:  [id number]           Height:       66.0 in Accession #:    [id number]          Weight:       282.0 lb Date of Birth:  05/08/1955           BSA:          2.314 m Patient Age:    17 years  BP:           132/90 mmHg Patient Gender: F                   HR:           100 bpm. Exam Location:  Inpatient Procedure: 2D Echo, Cardiac Doppler and Color Doppler Indications:    Syncope  History:        Patient has prior history of Echocardiogram examinations, most                 recent 05/04/2020. CHF, COPD, Signs/Symptoms:Syncope and                 Dizziness/Lightheadedness; Risk Factors:Hypertension,                 Dyslipidemia, Diabetes, Hx DVT, substance abuse and Sleep Apnea.  Sonographer:    Wallie Char Referring Phys: [id number] RIPUDEEP K RAI  Sonographer Comments: Patient is obese. Image acquisition challenging due to patient body habitus. IMPRESSIONS  1. Left ventricular ejection fraction, by estimation, is 65 to 70%. The left ventricle has normal function. The left ventricle has no regional wall motion abnormalities. Left ventricular diastolic parameters  are consistent with Grade I diastolic dysfunction (impaired relaxation). Elevated left ventricular end-diastolic pressure.  2. Right ventricular systolic function is normal. The right ventricular size is normal.  3. The mitral valve is normal in structure. No evidence of mitral valve regurgitation. No evidence of mitral stenosis.  4. The aortic valve was not well visualized. There is mild calcification of the aortic valve. Aortic valve regurgitation is not visualized. Aortic valve sclerosis is present, with no evidence of aortic valve stenosis.  5. The inferior vena cava is normal in size with greater than 50% respiratory variability, suggesting right atrial pressure of 3 mmHg. FINDINGS  Left Ventricle: Left ventricular ejection fraction, by estimation, is 65 to 70%. The left ventricle has normal function. The left ventricle has no regional wall motion abnormalities. The left ventricular internal cavity size was normal in size. There is  no left ventricular hypertrophy. Left ventricular diastolic parameters are consistent with Grade I diastolic dysfunction (impaired relaxation). Elevated left ventricular end-diastolic pressure. Right Ventricle: The right ventricular size is normal. No increase in right ventricular wall thickness. Right ventricular systolic function is normal. Left Atrium: Left atrial size was normal in size. Right Atrium: Right atrial size was normal in size. Pericardium: There is no evidence of pericardial effusion. Mitral Valve: The mitral valve is normal in structure. No evidence of mitral valve regurgitation. No evidence of mitral valve stenosis. MV peak gradient, 8.0 mmHg. The mean mitral valve gradient is 2.0 mmHg. Tricuspid Valve: The tricuspid valve is normal in structure. Tricuspid valve regurgitation is not demonstrated. No evidence of tricuspid stenosis. Aortic Valve: The aortic valve was not well visualized. There is mild calcification of the aortic valve. Aortic valve regurgitation is  not visualized. Aortic valve sclerosis is present, with no evidence of aortic valve stenosis. Aortic valve mean gradient measures 5.0 mmHg. Aortic valve peak gradient measures 9.0 mmHg. Aortic valve area, by VTI measures 2.18 cm. Pulmonic Valve: The pulmonic valve was normal in structure. Pulmonic valve regurgitation is not visualized. No evidence of pulmonic stenosis. Aorta: The aortic root is normal in size and structure. Venous: The inferior vena cava is normal in size with greater than 50% respiratory variability, suggesting right atrial pressure of 3 mmHg. IAS/Shunts: The interatrial septum was not well visualized.  LEFT VENTRICLE PLAX  2D LVIDd:         3.60 cm     Diastology LVIDs:         2.40 cm     LV e' medial:    4.56 cm/s LV PW:         1.00 cm     LV E/e' medial:  15.1 LV IVS:        1.00 cm     LV e' lateral:   4.72 cm/s LVOT diam:     1.90 cm     LV E/e' lateral: 14.6 LV SV:         48 LV SV Index:   21 LVOT Area:     2.84 cm  LV Volumes (MOD) LV vol d, MOD A2C: 71.5 ml LV vol d, MOD A4C: 80.4 ml LV vol s, MOD A2C: 24.2 ml LV vol s, MOD A4C: 26.6 ml LV SV MOD A2C:     47.3 ml LV SV MOD A4C:     80.4 ml LV SV MOD BP:      50.2 ml RIGHT VENTRICLE RV S prime:     29.00 cm/s TAPSE (M-mode): 1.7 cm LEFT ATRIUM             Index        RIGHT ATRIUM           Index LA diam:        3.10 cm 1.34 cm/m   RA Area:     10.40 cm LA Vol (A2C):   40.8 ml 17.63 ml/m  RA Volume:   21.80 ml  9.42 ml/m LA Vol (A4C):   36.5 ml 15.77 ml/m LA Biplane Vol: 41.1 ml 17.76 ml/m  AORTIC VALVE AV Area (Vmax):    2.35 cm AV Area (Vmean):   2.30 cm AV Area (VTI):     2.18 cm AV Vmax:           150.00 cm/s AV Vmean:          97.600 cm/s AV VTI:            0.220 m AV Peak Grad:      9.0 mmHg AV Mean Grad:      5.0 mmHg LVOT Vmax:         124.50 cm/s LVOT Vmean:        79.250 cm/s LVOT VTI:          0.169 m LVOT/AV VTI ratio: 0.77  AORTA Ao Root diam: 3.20 cm Ao Asc diam:  3.30 cm MITRAL VALVE               TRICUSPID VALVE  MV Area (PHT): 3.77 cm    TR Peak grad:   25.8 mmHg MV Area VTI:   2.46 cm    TR Vmax:        254.00 cm/s MV Peak grad:  8.0 mmHg MV Mean grad:  2.0 mmHg    SHUNTS MV Vmax:       1.41 m/s    Systemic VTI:  0.17 m MV Vmean:      72.0 cm/s   Systemic Diam: 1.90 cm MV Decel Time: 201 msec MV E velocity: 68.80 cm/s MV A velocity: 98.90 cm/s MV E/A ratio:  0.70 Charlton Haws MD Electronically signed by Charlton Haws MD Signature Date/Time: 08/24/2022/12:24:16 PM    Final    CT Head Wo Contrast  Result Date: 08/23/2022 CLINICAL DATA:  Syncope/presyncope, cerebrovascular cause suspected EXAM: CT HEAD WITHOUT  CONTRAST TECHNIQUE: Contiguous axial images were obtained from the base of the skull through the vertex without intravenous contrast. RADIATION DOSE REDUCTION: This exam was performed according to the departmental dose-optimization program which includes automated exposure control, adjustment of the mA and/or kV according to patient size and/or use of iterative reconstruction technique. COMPARISON:  CT head 07/31/22 FINDINGS: Brain: No evidence of acute infarction, hemorrhage, hydrocephalus, extra-axial collection or mass lesion/mass effect. 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 Vascular: No hyperdense vessel or unexpected calcification. Skull: Normal. Negative for fracture or focal lesion. Sinuses/Orbits: No middle ear or mastoid effusion. Paranasal sinuses are clear. Bilateral lens replacement. Orbits are otherwise unremarkable. Other: None. IMPRESSION: 1. No acute intracranial abnormality. 2. 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   On: 08/23/2022 14:16   DG Knee Complete 4 Views Left  Result Date: 08/23/2022 CLINICAL DATA:  Weakness and dizziness.  Left knee pain. EXAM: LEFT KNEE - COMPLETE 4+ VIEW COMPARISON:  10/07/2016 FINDINGS:  Status post left total knee arthroplasty. Small suprapatellar joint effusion is suspected. No signs of periprosthetic fracture, dislocation or loosening. Soft tissues are unremarkable. IMPRESSION: 1. Status post left total knee arthroplasty. No signs of periprosthetic fracture, loosening or dislocation 2. Small suprapatellar joint effusion. Electronically Signed   By: Signa Kell M.D.   On: 08/23/2022 12:08   DG Chest Port 1 View  Result Date: 08/23/2022 CLINICAL DATA:  weak EXAM: PORTABLE CHEST 1 VIEW COMPARISON:  CXR 02/18/21 FINDINGS: Borderline cardiomegaly. No pleural effusion. No pneumothorax. No focal airspace opacity. No radiographically apparent displaced rib fractures. Visualized upper abdomen is unremarkable. Partially visualized cervical spinal fusion hardware in place. IMPRESSION: No focal airspace opacity.  Borderline cardiomegaly. Electronically Signed   By: Lorenza Cambridge M.D.   On: 08/23/2022 12:05   - pertinent xrays, CT, MRI studies were reviewed and independently interpreted  Positive ROS: All other systems have been reviewed and were otherwise negative with the exception of those mentioned in the HPI and as above.  Physical Exam: General: Alert, no acute distress Psychiatric: Patient is competent for consent with normal mood and affect Lymphatic: No axillary or cervical lymphadenopathy Cardiovascular: No pedal edema Respiratory: No cyanosis, no use of accessory musculature GI: No organomegaly, abdomen is soft and non-tender    Images:  @ENCIMAGES @  Labs:  Lab Results  Component Value Date   HGBA1C 7.2 (H) 06/22/2022   HGBA1C 7.0 (A) 04/20/2022   HGBA1C 9.5 (H) 01/02/2022   ESRSEDRATE 81 (H) 08/23/2022   ESRSEDRATE 61 (H) 04/24/2016   ESRSEDRATE 65 (H) 01/25/2012   CRP 2.4 (H) 08/23/2022   CRP 2.9 (H) 03/16/2019   CRP 3.6 (H) 03/15/2019   LABURIC 6.2 08/23/2022   REPTSTATUS 08/20/2020 FINAL 08/18/2020   GRAMSTAIN Moderate 04/28/2016   GRAMSTAIN WBC  present-both PMN and Mononuclear 04/28/2016   GRAMSTAIN No Organisms Seen 04/28/2016   CULT MULTIPLE SPECIES PRESENT, SUGGEST RECOLLECTION (A) 08/18/2020   LABORGA NO GROWTH 04/28/2016    Lab Results  Component Value Date   ALBUMIN 3.4 (L) 08/23/2022   ALBUMIN 4.3 06/22/2022   ALBUMIN 4.1 01/02/2022   LABURIC 6.2 08/23/2022    Neurologic: Patient does not have protective sensation bilateral lower extremities.   MUSCULOSKELETAL:   Ortho exam demonstrates left knee with no significant effusion noted on exam.  She has no asymmetric warmth of the left knee  relative to the right.  She has no pain with hip range of motion with negative logroll test.  Incision is well-healed in the anterior aspect of the left knee without cellulitis or skin changes noted.  No sinus tract noted.  No calf tenderness.  Negative Homans' sign.  Intact dorsiflexion plantarflexion.  Palpable DP pulse.  She is not able to perform straight leg raise secondary to pain.  She has diffuse tenderness throughout the majority of the knee and does not really correlate with any specific anatomic landmark and she continues to state "I got the fibromyalgia".  She does have reduced range of motion again secondary to pain in the left knee with increased pain even with just a little bit of passive motion from about 0 degrees extension to 20 degrees knee flexion.  Assessment: Painful left knee replacement  Plan: Discussed patient with Dr. August Saucer.  Plan for further evaluation with CT of the left knee to further evaluate for evidence of loosening, occult fracture, infection.  Plan to review CT scan and hold off on aspiration for now with her INR and with no significant evidence of effusion on exam today.  Will follow. Low suspicion for prosthetic joint infection.  Thank you for the consult and the opportunity to see Ms. Lajeunesse  Karenann Cai, [place] [facility] Cyndia Skeeters [phone] 6:25 PM

## 2022-08-24 NOTE — Progress Notes (Signed)
Subjective: She complains about being dizzy when standing up.  Objective: Vital signs in last 24 hours: Temp:  [98.2 F (36.8 C)-98.9 F (37.2 C)] 98.3 F (36.8 C) (06/06 1232) Pulse Rate:  [94-101] 99 (06/06 1235) Resp:  [16-20] 20 (06/06 1232) BP: (104-171)/(85-109) 104/85 (06/06 1235) SpO2:  [93 %-100 %] 100 % (06/06 1235) Weight:  [127.9 kg] 127.9 kg (06/05 2000) Last BM Date : 08/22/22  Intake/Output from previous day: 06/05 0701 - 06/06 0700 In: 240 [P.O.:240] Out: -  Intake/Output this shift: Total I/O In: 480 [P.O.:480] Out: -   General appearance: alert and no distress GI: soft, non-tender; bowel sounds normal; no masses,  no organomegaly  Lab Results: Recent Labs    08/23/22 1144 08/24/22 0539  WBC 9.1 11.0*  HGB 9.9* 8.3*  HCT 31.5* 26.8*  PLT 343 318   BMET Recent Labs    08/23/22 1144 08/24/22 0539  NA 140 135  K 3.5 3.3*  CL 104 101  CO2 27 24  GLUCOSE 153* 146*  BUN 35* 47*  CREATININE 0.82 0.87  CALCIUM 8.1* 7.7*   LFT Recent Labs    08/23/22 1144  PROT 7.7  ALBUMIN 3.4*  AST 16  ALT 19  ALKPHOS 65  BILITOT 0.1*   PT/INR Recent Labs    08/24/22 0539 08/24/22 1227  LABPROT 45.8* 39.3*  INR 4.9* 4.0*   Hepatitis Panel No results for input(s): "HEPBSAG", "HCVAB", "HEPAIGM", "HEPBIGM" in the last 72 hours. C-Diff No results for input(s): "CDIFFTOX" in the last 72 hours. Fecal Lactopherrin No results for input(s): "FECLLACTOFRN" in the last 72 hours.  Studies/Results: ECHOCARDIOGRAM COMPLETE  Result Date: 08/24/2022    ECHOCARDIOGRAM REPORT   Patient Name:   [name] Date of Exam: 08/24/2022 Medical Rec #:  [id number]           Height:       66.0 in Accession #:    [id number]          Weight:       282.0 lb Date of Birth:  January 28, 1956           BSA:          2.314 m Patient Age:    67 years            BP:           132/90 mmHg Patient Gender: F                   HR:           100 bpm. Exam Location:  Inpatient  Procedure: 2D Echo, Cardiac Doppler and Color Doppler Indications:    Syncope  History:        Patient has prior history of Echocardiogram examinations, most                 recent 05/04/2020. CHF, COPD, Signs/Symptoms:Syncope and                 Dizziness/Lightheadedness; Risk Factors:Hypertension,                 Dyslipidemia, Diabetes, Hx DVT, substance abuse and Sleep Apnea.  Sonographer:    Wallie Char Referring Phys: [id number] RIPUDEEP K RAI  Sonographer Comments: Patient is obese. Image acquisition challenging due to patient body habitus. IMPRESSIONS  1. Left ventricular ejection fraction, by estimation, is 65 to 70%. The left ventricle has normal function. The left ventricle has no regional wall motion abnormalities. Left  ventricular diastolic parameters are consistent with Grade I diastolic dysfunction (impaired relaxation). Elevated left ventricular end-diastolic pressure.  2. Right ventricular systolic function is normal. The right ventricular size is normal.  3. The mitral valve is normal in structure. No evidence of mitral valve regurgitation. No evidence of mitral stenosis.  4. The aortic valve was not well visualized. There is mild calcification of the aortic valve. Aortic valve regurgitation is not visualized. Aortic valve sclerosis is present, with no evidence of aortic valve stenosis.  5. The inferior vena cava is normal in size with greater than 50% respiratory variability, suggesting right atrial pressure of 3 mmHg. FINDINGS  Left Ventricle: Left ventricular ejection fraction, by estimation, is 65 to 70%. The left ventricle has normal function. The left ventricle has no regional wall motion abnormalities. The left ventricular internal cavity size was normal in size. There is  no left ventricular hypertrophy. Left ventricular diastolic parameters are consistent with Grade I diastolic dysfunction (impaired relaxation). Elevated left ventricular end-diastolic pressure. Right Ventricle: The right  ventricular size is normal. No increase in right ventricular wall thickness. Right ventricular systolic function is normal. Left Atrium: Left atrial size was normal in size. Right Atrium: Right atrial size was normal in size. Pericardium: There is no evidence of pericardial effusion. Mitral Valve: The mitral valve is normal in structure. No evidence of mitral valve regurgitation. No evidence of mitral valve stenosis. MV peak gradient, 8.0 mmHg. The mean mitral valve gradient is 2.0 mmHg. Tricuspid Valve: The tricuspid valve is normal in structure. Tricuspid valve regurgitation is not demonstrated. No evidence of tricuspid stenosis. Aortic Valve: The aortic valve was not well visualized. There is mild calcification of the aortic valve. Aortic valve regurgitation is not visualized. Aortic valve sclerosis is present, with no evidence of aortic valve stenosis. Aortic valve mean gradient measures 5.0 mmHg. Aortic valve peak gradient measures 9.0 mmHg. Aortic valve area, by VTI measures 2.18 cm. Pulmonic Valve: The pulmonic valve was normal in structure. Pulmonic valve regurgitation is not visualized. No evidence of pulmonic stenosis. Aorta: The aortic root is normal in size and structure. Venous: The inferior vena cava is normal in size with greater than 50% respiratory variability, suggesting right atrial pressure of 3 mmHg. IAS/Shunts: The interatrial septum was not well visualized.  LEFT VENTRICLE PLAX 2D LVIDd:         3.60 cm     Diastology LVIDs:         2.40 cm     LV e' medial:    4.56 cm/s LV PW:         1.00 cm     LV E/e' medial:  15.1 LV IVS:        1.00 cm     LV e' lateral:   4.72 cm/s LVOT diam:     1.90 cm     LV E/e' lateral: 14.6 LV SV:         48 LV SV Index:   21 LVOT Area:     2.84 cm  LV Volumes (MOD) LV vol d, MOD A2C: 71.5 ml LV vol d, MOD A4C: 80.4 ml LV vol s, MOD A2C: 24.2 ml LV vol s, MOD A4C: 26.6 ml LV SV MOD A2C:     47.3 ml LV SV MOD A4C:     80.4 ml LV SV MOD BP:      50.2 ml RIGHT  VENTRICLE RV S prime:     29.00 cm/s TAPSE (M-mode): 1.7 cm LEFT  ATRIUM             Index        RIGHT ATRIUM           Index LA diam:        3.10 cm 1.34 cm/m   RA Area:     10.40 cm LA Vol (A2C):   40.8 ml 17.63 ml/m  RA Volume:   21.80 ml  9.42 ml/m LA Vol (A4C):   36.5 ml 15.77 ml/m LA Biplane Vol: 41.1 ml 17.76 ml/m  AORTIC VALVE AV Area (Vmax):    2.35 cm AV Area (Vmean):   2.30 cm AV Area (VTI):     2.18 cm AV Vmax:           150.00 cm/s AV Vmean:          97.600 cm/s AV VTI:            0.220 m AV Peak Grad:      9.0 mmHg AV Mean Grad:      5.0 mmHg LVOT Vmax:         124.50 cm/s LVOT Vmean:        79.250 cm/s LVOT VTI:          0.169 m LVOT/AV VTI ratio: 0.77  AORTA Ao Root diam: 3.20 cm Ao Asc diam:  3.30 cm MITRAL VALVE               TRICUSPID VALVE MV Area (PHT): 3.77 cm    TR Peak grad:   25.8 mmHg MV Area VTI:   2.46 cm    TR Vmax:        254.00 cm/s MV Peak grad:  8.0 mmHg MV Mean grad:  2.0 mmHg    SHUNTS MV Vmax:       1.41 m/s    Systemic VTI:  0.17 m MV Vmean:      72.0 cm/s   Systemic Diam: 1.90 cm MV Decel Time: 201 msec MV E velocity: 68.80 cm/s MV A velocity: 98.90 cm/s MV E/A ratio:  0.70 Colleen Haws MD Electronically signed by Colleen Haws MD Signature Date/Time: 08/24/2022/12:24:16 PM    Final    CT Head Wo Contrast  Result Date: 08/23/2022 CLINICAL DATA:  Syncope/presyncope, cerebrovascular cause suspected EXAM: CT HEAD WITHOUT CONTRAST TECHNIQUE: Contiguous axial images were obtained from the base of the skull through the vertex without intravenous contrast. RADIATION DOSE REDUCTION: This exam was performed according to the departmental dose-optimization program which includes automated exposure control, adjustment of the mA and/or kV according to patient size and/or use of iterative reconstruction technique. COMPARISON:  CT head 07/31/22 FINDINGS: Brain: No evidence of acute infarction, hemorrhage, hydrocephalus, extra-axial collection or mass lesion/mass effect. 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 Vascular: No hyperdense vessel or unexpected calcification. Skull: Normal. Negative for fracture or focal lesion. Sinuses/Orbits: No middle ear or mastoid effusion. Paranasal sinuses are clear. Bilateral lens replacement. Orbits are otherwise unremarkable. Other: None. IMPRESSION: 1. No acute intracranial abnormality. 2. 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   On: 08/23/2022 14:16   DG Knee Complete 4 Views Left  Result Date: 08/23/2022 CLINICAL DATA:  Weakness and dizziness.  Left knee pain. EXAM: LEFT KNEE - COMPLETE 4+ VIEW COMPARISON:  10/07/2016 FINDINGS: Status post left total knee arthroplasty. Small suprapatellar joint effusion is  suspected. No signs of periprosthetic fracture, dislocation or loosening. Soft tissues are unremarkable. IMPRESSION: 1. Status post left total knee arthroplasty. No signs of periprosthetic fracture, loosening or dislocation 2. Small suprapatellar joint effusion. Electronically Signed   By: Signa Kell M.D.   On: 08/23/2022 12:08   DG Chest Port 1 View  Result Date: 08/23/2022 CLINICAL DATA:  weak EXAM: PORTABLE CHEST 1 VIEW COMPARISON:  CXR 02/18/21 FINDINGS: Borderline cardiomegaly. No pleural effusion. No pneumothorax. No focal airspace opacity. No radiographically apparent displaced rib fractures. Visualized upper abdomen is unremarkable. Partially visualized cervical spinal fusion hardware in place. IMPRESSION: No focal airspace opacity.  Borderline cardiomegaly. Electronically Signed   By: Lorenza Cambridge M.D.   On: 08/23/2022 12:05    Medications: Scheduled:  sodium chloride   Intravenous Once   diltiazem  240 mg Oral QAC breakfast   DULoxetine  90 mg Oral QHS   fluticasone  2 spray Each Nare BID   gabapentin  300 mg Oral 2 times per day   insulin aspart  0-15  Units Subcutaneous TID WC   insulin aspart  0-5 Units Subcutaneous QHS   lidocaine  1 patch Transdermal Q24H   linaclotide  145 mcg Oral QODAY   methocarbamol  500 mg Oral TID   mometasone-formoterol  2 puff Inhalation BID   pantoprazole  40 mg Oral BID AC   pravastatin  40 mg Oral QPM   QUEtiapine  50 mg Oral BID   Continuous:  Assessment/Plan: 1) Melena. 2) Anemia. 3) Supratherapeutic INR.   She reports feeling well outside of standing up.  There was a reports that she had melena.  Hemodynamically she appears stable.  Her last INR was at 4.0.  Plan: 1) EGD tomorrow. 2) Monitor INR. 3) Maintain pantoprazole. 4) Transfuse if necessary.  LOS: 0 days   Candon Caras D 08/24/2022, 3:57 PM

## 2022-08-24 NOTE — Consult Note (Signed)
Cardiology Consultation:  Patient ID: [name] MRN: [id number]; DOB: Jul 01, 1955  Admit date: 08/23/2022 Date of Consult: 08/24/2022  Primary Care Provider: [name], MD Primary Cardiologist: Christell Constant, MD  Primary Electrophysiologist:  None   Patient Profile:  [name] is a 67 y.o. female with a hx of diabetes, hypertension, DVT on Coumadin, OSA, morbid obesity (BMI 45) who is being seen today for the evaluation of dizziness at the request of Ripudeep Rai, MD.  History of Present Illness:  [name] presents with leg pain and dizziness.  She reports 1 day prior to admission she developed intense left leg pain.  She reports it was very tender when she walked.  She reports yesterday morning she got up and the leg was bothering her again.  She went to go to the bathroom and sat down and the pain got worse.  She tells me that after this she got dizzy upon standing.  She continued to have intense left leg pain.  She reports did not pass out.  No loss of consciousness.  No chest pain or trouble breathing.  She has been taking her blood pressure medications and not missed any doses.  She describes no increase volume overload.  She reports no fevers or chills.  Laboratory data notable for potassium of 3.3, serum creatinine 0.87, hemoglobin 8.3. Of note hemoglobin is trending down from 11.6 in April and is now 8.3. INR supratherapeutic at 7.0.  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  Echocardiogram shows normal LV and RV function.  Coronary CTA 05/07/2020 Normal coronary arteries with no coronary calcium  Right heart cath 02/28/2021 Right atrial pressure 15 RV 47/6/16 PA 49/11/27 Pulmonary capillary wedge pressure 17  Heart Pathway Score:       Past Medical History: Past Medical History:  Diagnosis Date   Adrenal mass (HCC) 03/226   Benign   Arthritis     knees/multiple orthopedic conditons; lower back   Asthma    per pt   Clotting disorder (HCC)    +beta-2-glycoprotein IgA antibody   COPD (chronic obstructive pulmonary disease) (HCC)    inhalers dependent on environment   Depression    Diabetes mellitus    120s usually fasting -  dx more than 10 yrs ago   Dizziness, nonspecific    DVT (deep venous thrombosis) (HCC)    Recurrent   Fibromyalgia    GERD (gastroesophageal reflux disease)    Heart murmur    History of blood transfusion    History of cocaine abuse (HCC)    Remote history    Hyperlipidemia    Hypertension    takes meds daily   Hypothyroidism    Lupus Anticoagulant Positive    On home oxygen therapy    at night   Shortness of breath    exertion or lying flat   Sleep apnea    2l of oxygen at night (as of 12/6, she used to)    Past Surgical History: Past Surgical History:  Procedure Laterality Date   ABDOMINAL HYSTERECTOMY  1989   Fibroids   ANTERIOR LUMBAR FUSION  01/03/2012   Procedure: ANTERIOR LUMBAR FUSION 1 LEVEL;  Surgeon: Carmela Hurt, MD;  Location: [facility];  Service: Neurosurgery;  Laterality: N/A;  Lumbar Four-Five Anterior Lumbar Interbody Fusion with Instrumentation   BACK SURGERY     cervical spine---disk disease   CARPAL  TUNNEL RELEASE     Bilateral   CESAREAN SECTION     CHOLECYSTECTOMY     CYSTOSCOPY/RETROGRADE/URETEROSCOPY/STONE EXTRACTION WITH BASKET Right 04/26/2014   Procedure: CYSTOSCOPY/ RIGHT RETROGRADE/ RIGHT URETEROSCOPY/URETERAL AND RENAL PELVIS BIOPSY;  Surgeon: Sebastian Ache, MD;  Location: [facility];  Service: Urology;  Laterality: Right;   gallstones removed     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   bilateral   right elbow surgery     RIGHT HEART CATH N/A 02/28/2021   Procedure: RIGHT HEART CATH;  Surgeon: Kathleene Hazel, MD;  Location: [facility];  Service: Cardiovascular;  Laterality: N/A;   THYROID LOBECTOMY Right 02/27/2017   THYROID LOBECTOMY Right  02/27/2017   Procedure: RIGHT THYROID LOBECTOMY;  Surgeon: Harriette Bouillon, MD;  Location: [facility];  Service: General;  Laterality: Right;   TUBAL LIGATION       Home Medications:  Prior to Admission medications   Medication Sig Start Date End Date Taking? Authorizing Provider  albuterol (PROVENTIL) (2.5 MG/3ML) 0.083% nebulizer solution USE 1 VIAL IN NEBULIZER EVERY 6 HOURS - and as needed Patient taking differently: Take 2.5 mg by nebulization every 6 (six) hours as needed for wheezing or shortness of breath. 08/11/21  Yes [name], MD  albuterol (VENTOLIN HFA) 108 (90 Base) MCG/ACT inhaler INHALE TWO PUFFS BY MOUTH INTO LUNGS every FOUR hours AS NEEDED FOR SHORTNESS OF BREATH Patient taking differently: Inhale 2 puffs into the lungs every 4 (four) hours as needed for shortness of breath. 04/21/22  Yes [name], MD  cloNIDine (CATAPRES) 0.1 MG tablet TAKE ONE TABLET BY MOUTH BEFORE BREAKFAST and TAKE ONE TABLET BY MOUTH EVERYDAY AT BEDTIME Patient taking differently: Take 0.1 mg by mouth See admin instructions. Take 0.1 mg by mouth before breakfast and at bedtime 04/14/22  Yes [name], MD  diclofenac sodium (VOLTAREN) 1 % GEL Apply 2 g topically daily as needed (fibromyalgia pain- affected sites).   Yes [provider]  diltiazem (CARDIZEM CD) 240 MG 24 hr capsule TAKE ONE CAPSULE BY MOUTH BEFORE BREAKFAST Patient taking differently: Take 240 mg by mouth daily before breakfast. 11/15/21  Yes [name], MD  DULoxetine (CYMBALTA) 30 MG capsule Take 90 mg by mouth at bedtime.   Yes [provider]  FEROSUL 325 (65 Fe) MG tablet TAKE ONE TABLET BY MOUTH ONCE DAILY Patient taking differently: Take 325 mg by mouth daily with breakfast. 07/19/21  Yes [name], MD  fexofenadine (ALLEGRA) 180 MG tablet TAKE ONE TABLET BY MOUTH ONCE DAILY Patient taking differently: Take 180 mg by mouth daily. 07/24/22  Yes [name], MD  fluticasone (FLONASE) 50  MCG/ACT nasal spray Place 2 sprays into both nostrils 2 (two) times daily. Patient taking differently: Place 2 sprays into both nostrils 2 (two) times daily as needed for allergies or rhinitis. 06/22/22  Yes [name], MD  furosemide (LASIX) 80 MG tablet TAKE ONE TABLET BY MOUTH TWICE DAILY Patient taking differently: Take 80 mg by mouth 2 (two) times daily. 02/14/22  Yes Chandrasekhar, [name] A, MD  gabapentin (NEURONTIN) 300 MG capsule TAKE ONE CAPSULE BY MOUTH TWICE DAILY Patient taking differently: Take 300 mg by mouth See admin instructions. Take 300 mg by mouth at 6 PM and at bedtime 01/13/22  Yes Banks, Bettey Mare, MD  INVOKANA 100 MG TABS tablet TAKE ONE TABLET BY MOUTH BEFORE BREAKFAST Patient taking differently: Take 100 mg by mouth daily before breakfast. 11/15/21  Yes  Chandrasekhar, [name] A, MD  lidocaine (LIDODERM) 5 % Place 1 patch onto the skin daily. Remove & Discard patch within 12 hours or as directed by MD Patient taking differently: Place 1 patch onto the skin daily as needed (to painful site- Remove & Discard patch within 12 hours or as directed by MD). 03/22/22  Yes Blue, Soijett A, PA-C  LINZESS 145 MCG CAPS capsule TAKE ONE CAPSULE BY MOUTH every other DAY Patient taking differently: Take 145 mcg by mouth every other day. 02/14/22  Yes [name], MD  metFORMIN (GLUCOPHAGE) 500 MG tablet TAKE ONE TABLET BY MOUTH EVERYDAY AT BEDTIME Patient taking differently: Take 500 mg by mouth at bedtime. 11/15/21  Yes [name], MD  methocarbamol (ROBAXIN) 500 MG tablet Take 1 tablet (500 mg total) by mouth 2 (two) times daily. Patient taking differently: Take 500 mg by mouth 2 (two) times daily as needed for muscle spasms. 03/22/22  Yes Blue, Soijett A, PA-C  omeprazole (PRILOSEC) 20 MG capsule TAKE ONE CAPSULE BY MOUTH BEFORE BREAKFAST and TAKE ONE CAPSULE BY MOUTH EVERY EVENING Patient taking differently: Take 20 mg by mouth See admin instructions. Take 20 mg by mouth  before breakfast and in the evening 06/14/22  Yes [name], MD  potassium chloride (MICRO-K) 10 MEQ CR capsule TAKE TWO CAPSULES BY MOUTH EVERYDAY AT BEDTIME Patient taking differently: Take 20 mEq by mouth at bedtime. TAKE TWO CAPSULES BY MOUTH EVERYDAY AT BEDTIME 04/14/22  Yes [name], MD  pravastatin (PRAVACHOL) 40 MG tablet TAKE ONE TABLET BY MOUTH EVERY EVENING Patient taking differently: Take 40 mg by mouth every evening. 04/14/22  Yes [name], MD  QUEtiapine (SEROQUEL) 50 MG tablet Take 50 mg by mouth in the morning and at bedtime.   Yes [provider]  Semaglutide, 1 MG/DOSE, 4 MG/3ML SOPN Inject 1 mg as directed once a week. Patient taking differently: Inject 1 mg into the skin every Saturday. 04/20/22  Yes [name], MD  SYMBICORT 160-4.5 MCG/ACT inhaler INHALE TWO PUFFS BY MOUTH INTO LUNGS twice daily Patient taking differently: Inhale 2 puffs into the lungs 2 (two) times daily. 04/14/22  Yes [name], MD  traMADol (ULTRAM) 50 MG tablet Take 1 tablet (50 mg total) by mouth every 12 (twelve) hours as needed. Patient taking differently: Take 50 mg by mouth every 12 (twelve) hours as needed (for pain). 04/20/22  Yes [name], MD  Vitamin D, Ergocalciferol, (DRISDOL) 1.25 MG (50000 UNIT) CAPS capsule Take 1 capsule (50,000 Units total) by mouth every 7 (seven) days. 07/21/22  Yes [name], MD  warfarin (COUMADIN) 10 MG tablet TAKE ONE TABLET BY MOUTH daily EXCEPT ON Monday, Wednesday, AND Friday OR AS DIRECTED Patient taking differently: Take 10 mg by mouth See admin instructions. Take 10 mg by mouth at bedtime on Sun/Tues/Thurs/Sat 06/15/22  Yes Ladene Artist, MD  warfarin (COUMADIN) 7.5 MG tablet TAKE ONE TABLET BY MOUTH ON Monday, Wednesdays, AND Fridays Patient taking differently: Take 7.5 mg by mouth See admin instructions. Take 7.5 mg by mouth at bedtime on Mon/Wed/Fri 05/15/22  Yes Ladene Artist, MD  zonisamide (ZONEGRAN) 100  MG capsule Take 100-200 mg by mouth See admin instructions. Take 100 mg by mouth in the morning and 200 mg in the evening 11/24/21  Yes [provider]  ACCU-CHEK GUIDE test strip USE TO check blood glucose UP TO four times daily AS DIRECTED 04/14/22   Abbe Amsterdam  R, MD  Accu-Chek Softclix Lancets lancets USE TO check blood glucose UP TO four times daily AS DIRECTED 07/24/22   [name], MD  blood glucose meter kit and supplies KIT Dispense based on patient and insurance preference. Use up to four times daily as directed. (FOR ICD-9 250.00, 250.01). 07/16/19   [name], MD  Incontinence Supply Disposable (PROCARE BARIATRIC BRIEFS) MISC Use as needed. 04/20/22   [name], MD  spironolactone (ALDACTONE) 25 MG tablet TAKE 1/2 TABLET BY MOUTH ONCE DAILY Patient not taking: Reported on 08/23/2022 01/12/22   Christell Constant, MD    Inpatient Medications: Scheduled Meds:  diltiazem  240 mg Oral QAC breakfast   DULoxetine  90 mg Oral QHS   fluticasone  2 spray Each Nare BID   gabapentin  300 mg Oral 2 times per day   insulin aspart  0-15 Units Subcutaneous TID WC   insulin aspart  0-5 Units Subcutaneous QHS   lidocaine  1 patch Transdermal Q24H   linaclotide  145 mcg Oral QODAY   methocarbamol  500 mg Oral TID   mometasone-formoterol  2 puff Inhalation BID   pantoprazole  40 mg Oral BID AC   pravastatin  40 mg Oral QPM   QUEtiapine  50 mg Oral BID   Continuous Infusions:  PRN Meds: albuterol, HYDROmorphone (DILAUDID) injection, polyethylene glycol, traMADol  Allergies:    Allergies  Allergen Reactions   Lisinopril Anaphylaxis, Swelling and Other (See Comments)    THROAT SWELLING  Pt states her throat started to "close up" after being on it for "so long."  THROAT SWELLING, Pt states her throat started to "close up" after being on it for "so long."   Prednisone Anaphylaxis, Swelling and Other (See Comments)    Throat swelling and tongue irritation    Spironolactone Other (See Comments)    Caused stroke-like symptoms with the mouth   Corticosteroids Rash    States she developed a rash on her tongue after a steroid injection in her back    Social History:   Social History   Socioeconomic History   Marital status: Divorced    Spouse name: Not on file   Number of children: Not on file   Years of education: Not on file   Highest education level: Not on file  Occupational History   Not on file  Tobacco Use   Smoking status: Never   Smokeless tobacco: Never  Vaping Use   Vaping Use: Never used  Substance and Sexual Activity   Alcohol use: No    Alcohol/week: 0.0 standard drinks of alcohol    Comment: beer in past    Drug use: Yes    Types: Cocaine    Comment: quit 2003-rehab progam in 2005   Sexual activity: Never  Other Topics Concern   Not on file  Social History Narrative   Lives alone in apartment, drives   Social Determinants of Health   Financial Resource Strain: Low Risk  (03/03/2022)   Overall Financial Resource Strain (CARDIA)    Difficulty of Paying Living Expenses: Not hard at all  Food Insecurity: No Food Insecurity (08/23/2022)   Hunger Vital Sign    Worried About Running Out of Food in the Last Year: Never true    Ran Out of Food in the Last Year: Never true  Transportation Needs: No Transportation Needs (08/23/2022)   PRAPARE - Administrator, Civil Service (Medical): No    Lack of Transportation (  Non-Medical): No  Physical Activity: Insufficiently Active (03/03/2022)   Exercise Vital Sign    Days of Exercise per Week: 7 days    Minutes of Exercise per Session: 10 min  Stress: No Stress Concern Present (03/03/2022)   [employer] of Occupational Health - Occupational Stress Questionnaire    Feeling of Stress : Not at all  Social Connections: Moderately Integrated (03/03/2022)   Social Connection and Isolation Panel [NHANES]    Frequency of Communication with Friends and Family: More  than three times a week    Frequency of Social Gatherings with Friends and Family: More than three times a week    Attends Religious Services: More than 4 times per year    Active Member of [employer] or Organizations: Yes    Attends Engineer, structural: More than 4 times per year    Marital Status: Divorced  Intimate Partner Violence: Not At Risk (08/23/2022)   Humiliation, Afraid, Rape, and Kick questionnaire    Fear of Current or Ex-Partner: No    Emotionally Abused: No    Physically Abused: No    Sexually Abused: No     Family History:    Family History  Problem Relation Age of Onset   Hypertension Mother    Diabetes Mother    Cancer Mother        Pancreatic   Hypertension Father    Heart failure Father    Hyperlipidemia Other    COPD Other      ROS:  All other ROS reviewed and negative. Pertinent positives noted in the HPI.     Physical Exam/Data:   Vitals:   08/23/22 2134 08/24/22 0146 08/24/22 0543 08/24/22 0933  BP: (!) 167/94 (!) 140/88 (!) 132/90   Pulse: 97 94 96   Resp: 16 16 16    Temp: 98.9 F (37.2 C) 98.3 F (36.8 C) 98.2 F (36.8 C)   TempSrc: Oral Oral Oral   SpO2: 93% 99% 98% 97%  Weight:      Height:        Intake/Output Summary (Last 24 hours) at 08/24/2022 1138 Last data filed at 08/23/2022 2000 Gross per 24 hour  Intake 240 ml  Output --  Net 240 ml       08/23/2022    8:00 PM 07/29/2022   12:34 PM 07/27/2022    9:36 AM  Last 3 Weights  Weight (lbs) 281 lb 15.5 oz 282 lb 289 lb 1.6 oz  Weight (kg) 127.9 kg 127.914 kg 131.135 kg    Body mass index is 45.51 kg/m.  General: Well nourished, well developed, in no acute distress Head: Atraumatic, normal size  Eyes: PEERLA, EOMI  Neck: Supple, no JVD Endocrine: No thryomegaly Cardiac: Normal S1, S2; RRR; no murmurs, rubs, or gallops Lungs: Clear to auscultation bilaterally, no wheezing, rhonchi or rales  Abd: Soft, nontender, no hepatomegaly  Ext: No edema, pulses 2+ Musculoskeletal:  No deformities, BUE and BLE strength normal and equal Skin: Warm and dry, no rashes   Neuro: Alert and oriented to person, place, time, and situation, CNII-XII grossly intact, no focal deficits  Psych: Normal mood and affect   EKG:  The EKG was personally reviewed and demonstrates: Sinus rhythm heart rate 90s Telemetry:  Telemetry was personally reviewed and demonstrates: Sinus rhythm heart rate 91, nonspecific ST-T changes  Relevant CV Studies: Echocardiogram shows normal LV and RV function.  Coronary CTA 05/07/2020 Normal coronary arteries with no coronary calcium  Right heart cath 02/28/2021 Right  atrial pressure 15 RV 47/6/16 PA 49/11/27 Pulmonary capillary wedge pressure 17  Laboratory Data: High Sensitivity Troponin:   Recent Labs  Lab 08/23/22 1601 08/23/22 1859  TROPONINIHS 16 15     Cardiac EnzymesNo results for input(s): "TROPONINI" in the last 168 hours. No results for input(s): "TROPIPOC" in the last 168 hours.  Chemistry Recent Labs  Lab 08/23/22 1144 08/24/22 0539  NA 140 135  K 3.5 3.3*  CL 104 101  CO2 27 24  GLUCOSE 153* 146*  BUN 35* 47*  CREATININE 0.82 0.87  CALCIUM 8.1* 7.7*  GFRNONAA >60 >60  ANIONGAP 9 10    Recent Labs  Lab 08/23/22 1144  PROT 7.7  ALBUMIN 3.4*  AST 16  ALT 19  ALKPHOS 65  BILITOT 0.1*   Hematology Recent Labs  Lab 08/23/22 1144 08/24/22 0539  WBC 9.1 11.0*  RBC 3.80* 3.22*  HGB 9.9* 8.3*  HCT 31.5* 26.8*  MCV 82.9 83.2  MCH 26.1 25.8*  MCHC 31.4 31.0  RDW 17.3* 17.5*  PLT 343 318   BNPNo results for input(s): "BNP", "PROBNP" in the last 168 hours.  DDimer No results for input(s): "DDIMER" in the last 168 hours.  Radiology/Studies:  CT Head Wo Contrast  Result Date: 08/23/2022 CLINICAL DATA:  Syncope/presyncope, cerebrovascular cause suspected EXAM: CT HEAD WITHOUT CONTRAST TECHNIQUE: Contiguous axial images were obtained from the base of the skull through the vertex without intravenous contrast.  RADIATION DOSE REDUCTION: This exam was performed according to the departmental dose-optimization program which includes automated exposure control, adjustment of the mA and/or kV according to patient size and/or use of iterative reconstruction technique. COMPARISON:  CT head 07/31/22 FINDINGS: Brain: No evidence of acute infarction, hemorrhage, hydrocephalus, extra-axial collection or mass lesion/mass effect. 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 Vascular: No hyperdense vessel or unexpected calcification. Skull: Normal. Negative for fracture or focal lesion. Sinuses/Orbits: No middle ear or mastoid effusion. Paranasal sinuses are clear. Bilateral lens replacement. Orbits are otherwise unremarkable. Other: None. IMPRESSION: 1. No acute intracranial abnormality. 2. 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   On: 08/23/2022 14:16   DG Knee Complete 4 Views Left  Result Date: 08/23/2022 CLINICAL DATA:  Weakness and dizziness.  Left knee pain. EXAM: LEFT KNEE - COMPLETE 4+ VIEW COMPARISON:  10/07/2016 FINDINGS: Status post left total knee arthroplasty. Small suprapatellar joint effusion is suspected. No signs of periprosthetic fracture, dislocation or loosening. Soft tissues are unremarkable. IMPRESSION: 1. Status post left total knee arthroplasty. No signs of periprosthetic fracture, loosening or dislocation 2. Small suprapatellar joint effusion. Electronically Signed   By: Signa Kell M.D.   On: 08/23/2022 12:08   DG Chest Port 1 View  Result Date: 08/23/2022 CLINICAL DATA:  weak EXAM: PORTABLE CHEST 1 VIEW COMPARISON:  CXR 02/18/21 FINDINGS: Borderline cardiomegaly. No pleural effusion. No pneumothorax. No focal airspace opacity. No radiographically apparent displaced rib fractures. Visualized upper abdomen is unremarkable. Partially  visualized cervical spinal fusion hardware in place. IMPRESSION: No focal airspace opacity.  Borderline cardiomegaly. Electronically Signed   By: Lorenza Cambridge M.D.   On: 08/23/2022 12:05    Assessment and Plan:   #Dizziness -She reports dizziness upon standing.  This has been associated with intense left leg pain as well as poor oral intake.  She is also continue to take her blood pressure medications and  diuretic.  She is now admitted to the hospital with normal EKG and unremarkable telemetry.  Echocardiogram is unchanged.  CV exam shows she is euvolemic. -INR supratherapeutic.  Hemoglobin is trending down.  She has fecal occult positive. -Overall, suspect this is orthostatic intolerance in the setting of pain and new onset anemia.  There is concerns for possible GI bleed.  All of her cardiac testing is within limits.  Would recommend to hold blood pressure medications until the source of her GI bleed is worked up.  Also would hold Coumadin and have bleeding issues addressed before resumption of anticoagulation.  # HFpEF # Hypertension -Euvolemic on examination.  Can take her diuretic as needed.  Resume home BP medications as you are able and anemia has been corrected.  # DVT on Coumadin # Supratherapeutic INR # Anemia -Hold Coumadin in the setting of supratherapeutic INR.  She was given vitamin K due to concerns for GI bleed.  Would recommend to resume Coumadin after GI evaluation and determination of when it is safe to resume anticoagulation.  [facility] will sign off.   Medication Recommendations: As above Other recommendations (labs, testing, etc): None Follow up as an outpatient: She may keep regular outpatient follow-up.  Please call cardiology with questions.  For questions or updates, please contact [facility] Please consult [email or web address] for contact info under    Signed, Gerri Spore T. Flora Lipps, MD, [facility] [place]  [facility]  08/24/2022 11:38 AM

## 2022-08-24 NOTE — Progress Notes (Signed)
   08/24/22 2330  BiPAP/CPAP/SIPAP  $ Non-Invasive Home Ventilator  Initial  BiPAP/CPAP/SIPAP Pt Type Adult  BiPAP/CPAP/SIPAP Resmed  Mask Type Full face mask  Mask Size Large  Set Rate 0 breaths/min  Respiratory Rate 18 breaths/min  EPAP  (<6/>16)  FiO2 (%) 21 %  Flow Rate 0 lpm  Patient Home Equipment No  Auto Titrate Yes  CPAP/SIPAP surface wiped down Yes   Pt. Placed on CPAP Auto @ this time, tolerating well, made aware to notify if needed, uses one @ home.

## 2022-08-24 NOTE — Plan of Care (Signed)
  Problem: Education: [place]: Knowledge of General Education information will improve Description: Including pain rating scale, medication(s)/side effects and non-pharmacologic comfort measures Outcome: Progressing   Problem: Health Behavior/Discharge Planning: Joswick: Ability to manage health-related needs will improve Outcome: Progressing   Problem: Clinical Measurements: Dunshee: Ability to maintain clinical measurements within normal limits will improve Outcome: Progressing Mogg: Will remain free from infection Outcome: Progressing [place]: Diagnostic test results will improve Outcome: Progressing [place]: Respiratory complications will improve Outcome: Progressing Rockett: Cardiovascular complication will be avoided Outcome: Progressing   Problem: Activity: Sevcik: Risk for activity intolerance will decrease Outcome: Progressing   Problem: Nutrition: Mentzel: Adequate nutrition will be maintained Outcome: Progressing   Problem: Coping: Garciamartinez: Level of anxiety will decrease Outcome: Progressing   Problem: Elimination: Bolls: Will not experience complications related to bowel motility Outcome: Progressing Lorence: Will not experience complications related to urinary retention Outcome: Progressing   Problem: Pain Managment: Clinger: General experience of comfort will improve Outcome: Progressing   Problem: Safety: Durney: Ability to remain free from injury will improve Outcome: Progressing   Problem: Skin Integrity: Grasse: Risk for impaired skin integrity will decrease Outcome: Progressing   Problem: Education: Placke: Ability to describe self-care measures that may prevent or decrease complications (Diabetes Survival Skills Education) will improve Outcome: Progressing Velardi: Individualized Educational Video(s) Outcome: Progressing   Problem: Coping: Ambrose: Ability to adjust to condition or change in health will improve Outcome: Progressing   Problem: Fluid Volume: Kupper: Ability to  maintain a balanced intake and output will improve Outcome: Progressing   Problem: Health Behavior/Discharge Planning: Whitehair: Ability to identify and utilize available resources and services will improve Outcome: Progressing [place]: Ability to manage health-related needs will improve Outcome: Progressing   Problem: Metabolic: Mankins: Ability to maintain appropriate glucose levels will improve Outcome: Progressing   Problem: Nutritional: Rayborn: Maintenance of adequate nutrition will improve Outcome: Progressing Devincenzi: Progress toward achieving an optimal weight will improve Outcome: Progressing   Problem: Skin Integrity: Bahri: Risk for impaired skin integrity will decrease Outcome: Progressing   Problem: Tissue Perfusion: Vasallo: Adequacy of tissue perfusion will improve Outcome: Progressing   

## 2022-08-24 NOTE — TOC CM/SW Note (Signed)
Transition of Care [facility]) - Inpatient Brief Assessment   Patient Details  Name: [name] MRN: [id number] Date of Birth: [birth year]  Transition of Care [facility]) CM/SW Contact:    Durenda Guthrie, RN Phone Number: 08/24/2022, 9:13 AM   Clinical Narrative:    Transition of Care Asessment:   Patient has primary care physician: Yes (Dr. Abbe Amsterdam) Home environment has been reviewed: lives alone Prior level of function:: independent Prior/Current Home Services: Current home services (has aide that comes to assist) Social Determinants of Health Reivew: SDOH reviewed no interventions necessary Readmission risk has been reviewed: Yes Transition of care needs: transition of care needs identified, TOC will continue to follow (PT/OT to eval)

## 2022-08-24 NOTE — Progress Notes (Signed)
Date and time results received: 08/24/22 6213 (use smartphrase ".now" to insert current time)  Test: INR Critical Value: 4.9  Name of Provider Notified: MD on call  Orders Received? Or Actions Taken?:  waiting for new orders

## 2022-08-25 ENCOUNTER — Encounter (HOSPITAL_COMMUNITY): Payer: Self-pay | Admitting: Internal Medicine

## 2022-08-25 ENCOUNTER — Encounter (HOSPITAL_COMMUNITY)
Admission: EM | Disposition: A | Discharge status: Skilled Nursing Facility | Payer: Self-pay | Source: Home / Self Care | Attending: Internal Medicine

## 2022-08-25 ENCOUNTER — Inpatient Hospital Stay (HOSPITAL_COMMUNITY): Payer: 59

## 2022-08-25 ENCOUNTER — Inpatient Hospital Stay (HOSPITAL_COMMUNITY): Payer: 59 | Admitting: Anesthesiology

## 2022-08-25 DIAGNOSIS — K31811 Angiodysplasia of stomach and duodenum with bleeding: Secondary | ICD-10-CM | POA: Diagnosis not present

## 2022-08-25 DIAGNOSIS — I509 Heart failure, unspecified: Secondary | ICD-10-CM

## 2022-08-25 DIAGNOSIS — K317 Polyp of stomach and duodenum: Secondary | ICD-10-CM | POA: Diagnosis not present

## 2022-08-25 DIAGNOSIS — Z7901 Long term (current) use of anticoagulants: Secondary | ICD-10-CM | POA: Diagnosis not present

## 2022-08-25 DIAGNOSIS — I110 Hypertensive heart disease with heart failure: Secondary | ICD-10-CM

## 2022-08-25 DIAGNOSIS — I5032 Chronic diastolic (congestive) heart failure: Secondary | ICD-10-CM | POA: Diagnosis not present

## 2022-08-25 DIAGNOSIS — R55 Syncope and collapse: Secondary | ICD-10-CM | POA: Diagnosis not present

## 2022-08-25 DIAGNOSIS — D509 Iron deficiency anemia, unspecified: Secondary | ICD-10-CM

## 2022-08-25 DIAGNOSIS — I82403 Acute embolism and thrombosis of unspecified deep veins of lower extremity, bilateral: Secondary | ICD-10-CM | POA: Diagnosis not present

## 2022-08-25 HISTORY — PX: ESOPHAGOGASTRODUODENOSCOPY (EGD) WITH PROPOFOL: SHX5813

## 2022-08-25 HISTORY — PX: HOT HEMOSTASIS: SHX5433

## 2022-08-25 HISTORY — PX: POLYPECTOMY: SHX5525

## 2022-08-25 HISTORY — PX: HEMOSTASIS CLIP PLACEMENT: SHX6857

## 2022-08-25 LAB — TYPE AND SCREEN: Donor AG Type: NEGATIVE

## 2022-08-25 LAB — BASIC METABOLIC PANEL
Anion gap: 12 (ref 5–15)
BUN: 57 mg/dL — ABNORMAL HIGH (ref 8–23)
CO2: 21 mmol/L — ABNORMAL LOW (ref 22–32)
Calcium: 8.3 mg/dL — ABNORMAL LOW (ref 8.9–10.3)
Chloride: 106 mmol/L (ref 98–111)
Creatinine, Ser: 0.89 mg/dL (ref 0.44–1.00)
GFR, Estimated: >60 mL/min (ref >60)
Glucose, Bld: 190 mg/dL — ABNORMAL HIGH (ref 70–99)
Potassium: 3.8 mmol/L (ref 3.5–5.1)
Sodium: 139 mmol/L (ref 135–145)

## 2022-08-25 LAB — CBC
HCT: 20.6 % — ABNORMAL LOW (ref 36.0–46.0)
Hemoglobin: 6.1 g/dL — CL (ref 12.0–15.0)
MCH: 26.4 pg (ref 26.0–34.0)
MCHC: 29.6 g/dL — ABNORMAL LOW (ref 30.0–36.0)
MCV: 89.2 fL (ref 80.0–100.0)
Platelets: 263 10*3/uL (ref 150–400)
RBC: 2.31 MIL/uL — ABNORMAL LOW (ref 3.87–5.11)
RDW: 18.5 % — ABNORMAL HIGH (ref 11.5–15.5)
WBC: 13.8 10*3/uL — ABNORMAL HIGH (ref 4.0–10.5)
nRBC: 1 % — ABNORMAL HIGH (ref 0.0–0.2)

## 2022-08-25 LAB — GLUCOSE, CAPILLARY
Glucose-Capillary: 169 mg/dL — ABNORMAL HIGH (ref 70–99)
Glucose-Capillary: 139 mg/dL — ABNORMAL HIGH (ref 70–99)
Glucose-Capillary: 152 mg/dL — ABNORMAL HIGH (ref 70–99)
Glucose-Capillary: 184 mg/dL — ABNORMAL HIGH (ref 70–99)

## 2022-08-25 LAB — PREPARE RBC (CROSSMATCH)

## 2022-08-25 LAB — BPAM FFP
ISSUE DATE / TIME: 202406061622
Unit Type and Rh: 6200

## 2022-08-25 LAB — BPAM RBC: Blood Product Expiration Date: 202407132359

## 2022-08-25 LAB — PROTIME-INR
INR: 2.4 — ABNORMAL HIGH (ref 0.8–1.2)
Prothrombin Time: 26.6 seconds — ABNORMAL HIGH (ref 11.4–15.2)

## 2022-08-25 LAB — PREPARE FRESH FROZEN PLASMA: Unit division: 0

## 2022-08-25 SURGERY — ESOPHAGOGASTRODUODENOSCOPY (EGD) WITH PROPOFOL
Anesthesia: Monitor Anesthesia Care

## 2022-08-25 MED ORDER — PROPOFOL 10 MG/ML IV BOLUS
INTRAVENOUS | Status: DC | PRN
  Administered 2022-08-25 (×2): 30 mg via INTRAVENOUS

## 2022-08-25 MED ORDER — PROPOFOL 500 MG/50ML IV EMUL
INTRAVENOUS | Status: AC
  Filled 2022-08-25: qty 50

## 2022-08-25 MED ORDER — LACTATED RINGERS IV SOLN: INTRAVENOUS | Status: DC | PRN

## 2022-08-25 MED ORDER — PHENYLEPHRINE 80 MCG/ML (10ML) SYRINGE FOR IV PUSH (FOR BLOOD PRESSURE SUPPORT)
PREFILLED_SYRINGE | INTRAVENOUS | Status: DC | PRN
  Administered 2022-08-25: 160 ug via INTRAVENOUS

## 2022-08-25 MED ORDER — SODIUM CHLORIDE 0.9 % IV SOLN: INTRAVENOUS | Status: DC | PRN

## 2022-08-25 MED ORDER — SODIUM CHLORIDE 0.9% IV SOLUTION: Freq: Once | INTRAVENOUS | Status: AC

## 2022-08-25 MED ORDER — LACTATED RINGERS IV SOLN: INTRAVENOUS | Status: DC

## 2022-08-25 MED ORDER — SODIUM CHLORIDE 0.9 % IV SOLN: INTRAVENOUS | Status: DC

## 2022-08-25 MED ORDER — PROPOFOL 500 MG/50ML IV EMUL
INTRAVENOUS | Status: DC | PRN
  Administered 2022-08-25: 125 ug/kg/min via INTRAVENOUS

## 2022-08-25 MED ORDER — PROPOFOL 1000 MG/100ML IV EMUL
INTRAVENOUS | Status: AC
  Filled 2022-08-25: qty 100

## 2022-08-25 SURGICAL SUPPLY — 15 items

## 2022-08-25 NOTE — Anesthesia Procedure Notes (Signed)
Procedure Name: MAC Date/Time: 08/25/2022 1:32 PM  Performed by: Lovie Chol, CRNAPre-anesthesia Checklist: Patient identified, Emergency Drugs available, Suction available, Patient being monitored and Timeout performed Oxygen Delivery Method: Simple face mask

## 2022-08-25 NOTE — Progress Notes (Signed)
OT Cancellation Note  Patient Details Name: [name] MRN: [id number] DOB: 01-09-1956   Cancelled Treatment:    Reason Eval/Treat Not Completed: Patient not medically ready. PT with Hgb of 6.1. Discussed with RN and pt who agree that holding OT for now until post PRBCs would benefit the pt. PT also scheduled for EGD today, so anticipate attempting again tomorrow as time allows.   Theodoro Clock 08/25/2022, 9:08 AM

## 2022-08-25 NOTE — Progress Notes (Signed)
PT Cancellation Note  Patient Details Name: [name] MRN: [id number] DOB: December 25, 1955   Cancelled Treatment:    Reason Eval/Treat Not Completed: Medical issues which prohibited therapy  Noted pt's hgb drop to 6.1 and to receive PRBC. Pt admitted with near syncope and was dizzy with therapy at evaluation.  Will hold PT until pt receives PRBC as she is symptomatic.  Anise Salvo, PT Acute Rehab [facility] [phone]  Rayetta Humphrey 08/25/2022, 9:59 AM

## 2022-08-25 NOTE — Progress Notes (Signed)
   08/25/22 1826  Assess: MEWS Score  Temp 98.2 F (36.8 C)  BP (!) 165/82  Pulse Rate (!) 103  ECG Heart Rate (!) 101  Resp (!) 22  SpO2 99 %  O2 Device Nasal Cannula  O2 Flow Rate (L/min) 3 L/min  Assess: MEWS Score  MEWS Temp 0  MEWS Systolic 0  MEWS Pulse 1  MEWS RR 1  MEWS LOC 1  MEWS Score 3  MEWS Score Color Yellow  Assess: if the MEWS score is Yellow or Red  Were vital signs taken at a resting state? Yes  Focused Assessment Change from prior assessment (see assessment flowsheet)  Does the patient meet 2 or more of the SIRS criteria? Yes  Does the patient have a confirmed or suspected source of infection? No  MEWS guidelines implemented  Yes, yellow  Treat  MEWS Interventions Considered administering scheduled or prn medications/treatments as ordered  Take Vital Signs  Increase Vital Sign Frequency  Yellow: Q2hr x1, continue Q4hrs until patient remains green for 12hrs  Escalate  MEWS: Escalate Yellow: Discuss with charge nurse and consider notifying provider and/or RRT  Notify: Charge Nurse/RN  Name of Charge Nurse/RN Notified T Kaivon Livesey  Provider Notification  Provider Name/Title Virgel Manifold  Date Provider Notified 08/25/22  Time Provider Notified 1930  Method of Notification Page  Notification Reason Change in status  Provider response No new orders  Date of Provider Response  (no response)  Assess: SIRS CRITERIA  SIRS Temperature  0  SIRS Pulse 1  SIRS Respirations  1  SIRS WBC 0  SIRS Score Sum  2

## 2022-08-25 NOTE — Progress Notes (Signed)
   08/25/22 2139  BiPAP/CPAP/SIPAP  BiPAP/CPAP/SIPAP Pt Type Adult  BiPAP/CPAP/SIPAP Resmed  Mask Type Full face mask  Mask Size Large  Set Rate 0 breaths/min  Respiratory Rate 18 breaths/min  FiO2 (%) 21 % (Pt doesnt use O2 with cpap)  Patient Home Equipment No  Auto Titrate Yes (16/6)  CPAP/SIPAP surface wiped down Yes  BiPAP/CPAP /SiPAP Vitals  Pulse Rate 98  Resp 18  SpO2 96 %  Bilateral Breath Sounds Clear;Diminished  MEWS Score/Color  MEWS Score 1  MEWS Score Color Chilton Si

## 2022-08-25 NOTE — Anesthesia Preprocedure Evaluation (Addendum)
Anesthesia Evaluation  Patient identified by MRN, date of birth, ID band Patient awake    Reviewed: Allergy & Precautions, NPO status , Patient's Chart, lab work & pertinent test results  Airway Mallampati: III  TM Distance: >3 FB Neck ROM: Full    Dental  (+) Missing, Dental Advisory Given   Pulmonary asthma , sleep apnea, Continuous Positive Airway Pressure Ventilation and Oxygen sleep apnea , COPD,  COPD inhaler and oxygen dependent   Pulmonary exam normal        Cardiovascular hypertension, Pt. on medications +CHF and + DVT  Normal cardiovascular exam  ECHO:  1. Left ventricular ejection fraction, by estimation, is 65 to 70%. The  left ventricle has normal function. The left ventricle has no regional  wall motion abnormalities. Left ventricular diastolic parameters are  consistent with Grade I diastolic  dysfunction (impaired relaxation). Elevated left ventricular end-diastolic  pressure.   2. Right ventricular systolic function is normal. The right ventricular  size is normal.   3. The mitral valve is normal in structure. No evidence of mitral valve  regurgitation. No evidence of mitral stenosis.   4. The aortic valve was not well visualized. There is mild calcification  of the aortic valve. Aortic valve regurgitation is not visualized. Aortic  valve sclerosis is present, with no evidence of aortic valve stenosis.   5. The inferior vena cava is normal in size with greater than 50%  respiratory variability, suggesting right atrial pressure of 3 mmHg.     Neuro/Psych  PSYCHIATRIC DISORDERS  Depression     Neuromuscular disease    GI/Hepatic ,GERD  Medicated and Controlled,,(+)     substance abuse    Endo/Other  diabetes, Oral Hypoglycemic Agents  Morbid obesity  Renal/GU negative Renal ROS     Musculoskeletal  (+) Arthritis ,  Fibromyalgia -  Abdominal  (+) + obese  Peds  Hematology  (+) Blood dyscrasia  (Coumadin), anemia   Anesthesia Other Findings Melena  Anemia  Reproductive/Obstetrics                              Anesthesia Physical Anesthesia Plan  ASA: 4  Anesthesia Plan: MAC   Post-op Pain Management:    Induction: Intravenous  PONV Risk Score and Plan: 2 and Propofol infusion and Treatment may vary due to age or medical condition  Airway Management Planned: Nasal Cannula  Additional Equipment:   Intra-op Plan:   Post-operative Plan:   Informed Consent: I have reviewed the patients History and Physical, chart, labs and discussed the procedure including the risks, benefits and alternatives for the proposed anesthesia with the patient or authorized representative who has indicated his/her understanding and acceptance.     Dental advisory given  Plan Discussed with: CRNA  Anesthesia Plan Comments:          Anesthesia Quick Evaluation

## 2022-08-25 NOTE — Anesthesia Postprocedure Evaluation (Signed)
Anesthesia Post Note  Patient: [name]  Procedure(s) Performed: ESOPHAGOGASTRODUODENOSCOPY (EGD) WITH PROPOFOL HOT HEMOSTASIS (ARGON PLASMA COAGULATION/BICAP) HEMOSTASIS CLIP PLACEMENT POLYPECTOMY     Patient location during evaluation: Endoscopy Anesthesia Type: MAC Level of consciousness: awake and alert Pain management: pain level controlled Vital Signs Assessment: post-procedure vital signs reviewed and stable Respiratory status: spontaneous breathing, nonlabored ventilation, respiratory function stable and patient connected to nasal cannula oxygen Cardiovascular status: blood pressure returned to baseline and stable Postop Assessment: no apparent nausea or vomiting Anesthetic complications: no   No notable events documented.  Last Vitals:  Vitals:   08/25/22 1539 08/25/22 1632  BP: (!) 164/93   Pulse: 85   Resp: 16   Temp: 37.1 C 36.5 C  SpO2: 98%     Last Pain:  Vitals:   08/25/22 1632  TempSrc: Oral  PainSc:                  [name]

## 2022-08-25 NOTE — Progress Notes (Signed)
[facility]                                                                              [name], is a 67 y.o. 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  Patient reported dizziness that started a day before admission and left knee almost gave out.  She has been having left knee pain and has been operated on previously by Dr. August Saucer.  On the day of admission, she felt more dizzy, diaphoretic, no chest pain abdominal pain or any headaches.  She continued to have constant knee pain, called Dr. Lyndle Herrlich office and had an appointment however could not make it to the appointment due to dizziness.   Patient denies taking NSAIDs for the knee pain.  Takes PPI for GERD  No nausea, vomiting, abdominal pain or diarrhea.  Patient takes Coumadin for history of DVTs, followed by Dr. Alcide Evener.  Per patient, she was having low INR hence she had recently increased her Coumadin.  In ED, temp 98.3, RR 27, heart rate 93, BP initially 133/82, then 111/97, O2 sats 97% on room air CMET unremarkable, CBC showed hemoglobin 9.9, was 11.6 on 06/22/2022 INR 7.0 FOBT positive   Assessment & Plan    Principal Problem:   Near syncope -Patient noted to be borderline BP on multiple antihypertensives outpatient (clonidine, Cardizem, Lasix, Aldactone), held on admission.   -Patient received 500 cc IVF bolus in ED -Troponins x 2 negative.  EKG did not show acute ST-T wave changes suggestive of ischemia.  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  GI bleed -FOBT positive with supratherapeutic INR.  Patient has history of GERD however denies taking any NSAIDs -Baseline hemoglobin 11- 12, Hb on admission 9.9-> trended down to 6.1 today, ordered 2 units packed RBCs -Continue Protonix 40 mg twice daily -Plan for EGD today  Acute on chronic left knee pain  -Patient has a prior history of left knee arthroplasty.  X-ray showed small suprapatellar joint effusion, no signs of periprosthetic fracture loosening or dislocation -CRP 2.4, ESR 81, uric acid 6.1 -Continue pain control, orthopedics following.  -CT left knee pending      Chronic  diastolic CHF (congestive heart failure) (HCC) -2D echo 6/6 showed EF of 65-70%, G1 DD -Currently euvolemic      Mixed hyperlipidemia -Continue statin  Obesity Estimated body mass index is 45.51 kg/m as calculated from the following:   Height as of this encounter: 5\' 6"  (1.676 m).   Weight as of this encounter: 127.9 kg.  Code Status: Full code DVT Prophylaxis:  SCDs Start: 08/23/22 1948   Level of Care: Level of care: Telemetry Family Communication: Updated patient's daughter on phone  Disposition Plan:      Remains inpatient appropriate:     Procedures:    Consultants:   GI Orthopedics Cardiology  Antimicrobials: None   Medications  sodium chloride   Intravenous Once   diltiazem  240 mg Oral QAC breakfast   DULoxetine  90 mg Oral QHS   fluticasone  2 spray Each Nare BID   gabapentin  300 mg Oral 2 times per day   insulin aspart  0-15  Units Subcutaneous TID WC   insulin aspart  0-5 Units Subcutaneous QHS   lidocaine  1 patch Transdermal Q24H   linaclotide  145 mcg Oral QODAY   methocarbamol  500 mg Oral TID   mometasone-formoterol  2 puff Inhalation BID   pantoprazole  40 mg Oral BID AC   pravastatin  40 mg Oral QPM   QUEtiapine  50 mg Oral BID      Subjective:   [name] was seen and examined today.  Still feels dizzy, still has left knee pain  No nausea vomiting, hematemesis, did have melena yesterday   Objective:   Vitals:   08/24/22 1937 08/25/22 0514 08/25/22 0828 08/25/22 0856  BP: (!) 155/72 (!) 140/78  (!) 162/83  Pulse: 95 94  93  Resp: 16 16    Temp: 98.1 F (36.7 C) 98.2 F (36.8 C)    TempSrc: Oral Oral    SpO2: 99% 98% 97%   Weight:      Height:        Intake/Output Summary (Last 24 hours) at 08/25/2022 1241 Last data filed at 08/24/2022 1700 Gross per 24 hour  Intake 480 ml  Output --  Net 480 ml     Wt Readings from Last 3 Encounters:  08/23/22 127.9 kg  07/29/22 127.9 kg  07/27/22 131.1 kg    Physical Exam General: Alert and oriented x 3, NAD Cardiovascular: S1 S2 clear, RRR.  Respiratory: CTAB Gastrointestinal: Soft, NT, ND, NBS Ext: no pedal edema bilaterally, difficult to bend left knee due to pain Neuro: no new deficits Psych: Normal affect   Data Reviewed:  I have personally reviewed following labs    CBC Lab Results  Component Value Date   WBC 13.8 (H) 08/25/2022   RBC 2.31 (L) 08/25/2022   HGB 6.1 (LL) 08/25/2022   HCT 20.6 (L) 08/25/2022   MCV 89.2 08/25/2022   MCH 26.4 08/25/2022   PLT 263 08/25/2022   MCHC 29.6 (L) 08/25/2022   RDW 18.5 (H) 08/25/2022   LYMPHSABS 2.2 08/23/2022   MONOABS 0.7 08/23/2022   EOSABS 0.1 08/23/2022   BASOSABS 0.0 08/23/2022     Last metabolic panel Lab Results  Component Value Date   NA 139 08/25/2022   K 3.8 08/25/2022   CL 106 08/25/2022   CO2 21 (L) 08/25/2022   BUN 57 (H) 08/25/2022   CREATININE 0.89  08/25/2022   GLUCOSE 190 (H) 08/25/2022   GFRNONAA >60 08/25/2022   GFRAA 94 03/05/2020  CALCIUM 8.3 (L) 08/25/2022   PHOS 2.7 06/12/2014   PROT 7.7 08/23/2022   ALBUMIN 3.4 (L) 08/23/2022   BILITOT 0.1 (L) 08/23/2022   ALKPHOS 65 08/23/2022   AST 16 08/23/2022   ALT 19 08/23/2022   ANIONGAP 12 08/25/2022    CBG (last 3)  Recent Labs    08/24/22 2149 08/25/22 0717 08/25/22 1218  GLUCAP 170* 169* 139*      Coagulation Profile: Recent Labs  Lab 08/23/22 1144 08/23/22 2117 08/24/22 0539 08/24/22 1227 08/25/22 0625  INR 7.0* 6.2* 4.9* 4.0* 2.4*     Radiology Studies: I have personally reviewed the imaging studies  ECHOCARDIOGRAM COMPLETE  Result Date: 08/24/2022    ECHOCARDIOGRAM REPORT   Patient Name:   [facility] [place] Date of Exam: 08/24/2022 Medical Rec #:  [id number]           Height:       66.0 in Accession #:    [id number]          Weight:       282.0 lb Date of Birth:  [birth year]           BSA:          2.314 m Patient Age:    67 years            BP:           132/90 mmHg Patient Gender: F                   HR:           100 bpm. Exam Location:  Inpatient Procedure: 2D Echo, Cardiac Doppler and Color Doppler Indications:    Syncope  History:        Patient has prior history of Echocardiogram examinations, most                 recent 05/04/2020. CHF, COPD, Signs/Symptoms:Syncope and                 Dizziness/Lightheadedness; Risk Factors:Hypertension,                 Dyslipidemia, Diabetes, Hx DVT, substance abuse and Sleep Apnea.  Sonographer:    Wallie Char Referring Phys: [id number] Javonte Elenes K Jomo Forand  Sonographer Comments: Patient is obese. Image acquisition challenging due to patient body habitus. IMPRESSIONS  1. Left ventricular ejection fraction, by estimation, is 65 to 70%. The left ventricle has normal function. The left ventricle has no regional wall motion abnormalities. Left ventricular diastolic parameters are consistent with Grade I diastolic dysfunction (impaired  relaxation). Elevated left ventricular end-diastolic pressure.  2. Right ventricular systolic function is normal. The right ventricular size is normal.  3. The mitral valve is normal in structure. No evidence of mitral valve regurgitation. No evidence of mitral stenosis.  4. The aortic valve was not well visualized. There is mild calcification of the aortic valve. Aortic valve regurgitation is not visualized. Aortic valve sclerosis is present, with no evidence of aortic valve stenosis.  5. The inferior vena cava is normal in size with greater than 50% respiratory variability, suggesting right atrial pressure of 3 mmHg. FINDINGS  Left Ventricle: Left ventricular ejection fraction, by estimation, is 65 to 70%. The left ventricle has normal function. The left ventricle has no regional wall motion abnormalities. The left ventricular internal cavity size was normal in size. There is  no left ventricular hypertrophy. Left ventricular diastolic parameters are consistent with Grade I diastolic dysfunction (impaired relaxation).  Elevated left ventricular end-diastolic pressure. Right Ventricle: The right ventricular size is normal. No increase in right ventricular wall thickness. Right ventricular systolic function is normal. Left Atrium: Left atrial size was normal in size. Right Atrium: Right atrial size was normal in size. Pericardium: There is no evidence of pericardial effusion. Mitral Valve: The mitral valve is normal in structure. No evidence of mitral valve regurgitation. No evidence of mitral valve stenosis. MV peak gradient, 8.0 mmHg. The mean mitral valve gradient is 2.0 mmHg. Tricuspid Valve: The tricuspid valve is normal in structure. Tricuspid valve regurgitation is not demonstrated. No evidence of tricuspid stenosis. Aortic Valve: The aortic valve was not well visualized. There is mild calcification of the aortic valve. Aortic valve regurgitation is not visualized. Aortic valve sclerosis is present, with no  evidence of aortic valve stenosis. Aortic valve mean gradient measures 5.0 mmHg. Aortic valve peak gradient measures 9.0 mmHg. Aortic valve area, by VTI measures 2.18 cm. Pulmonic Valve: The pulmonic valve was normal in structure. Pulmonic valve regurgitation is not visualized. No evidence of pulmonic stenosis. Aorta: The aortic root is normal in size and structure. Venous: The inferior vena cava is normal in size with greater than 50% respiratory variability, suggesting right atrial pressure of 3 mmHg. IAS/Shunts: The interatrial septum was not well visualized.  LEFT VENTRICLE PLAX 2D LVIDd:         3.60 cm     Diastology LVIDs:         2.40 cm     LV e' medial:    4.56 cm/s LV PW:         1.00 cm     LV E/e' medial:  15.1 LV IVS:        1.00 cm     LV e' lateral:   4.72 cm/s LVOT diam:     1.90 cm     LV E/e' lateral: 14.6 LV SV:         48 LV SV Index:   21 LVOT Area:     2.84 cm  LV Volumes (MOD) LV vol d, MOD A2C: 71.5 ml LV vol d, MOD A4C: 80.4 ml LV vol s, MOD A2C: 24.2 ml LV vol s, MOD A4C: 26.6 ml LV SV MOD A2C:     47.3 ml LV SV MOD A4C:     80.4 ml LV SV MOD BP:      50.2 ml RIGHT VENTRICLE RV S prime:     29.00 cm/s TAPSE (M-mode): 1.7 cm LEFT ATRIUM             Index        RIGHT ATRIUM           Index LA diam:        3.10 cm 1.34 cm/m   RA Area:     10.40 cm LA Vol (A2C):   40.8 ml 17.63 ml/m  RA Volume:   21.80 ml  9.42 ml/m LA Vol (A4C):   36.5 ml 15.77 ml/m LA Biplane Vol: 41.1 ml 17.76 ml/m  AORTIC VALVE AV Area (Vmax):    2.35 cm AV Area (Vmean):   2.30 cm AV Area (VTI):     2.18 cm AV Vmax:           150.00 cm/s AV Vmean:          97.600 cm/s AV VTI:            0.220 m AV Peak Grad:  9.0 mmHg AV Mean Grad:      5.0 mmHg LVOT Vmax:         124.50 cm/s LVOT Vmean:        79.250 cm/s LVOT VTI:          0.169 m LVOT/AV VTI ratio: 0.77  AORTA Ao Root diam: 3.20 cm Ao Asc diam:  3.30 cm MITRAL VALVE               TRICUSPID VALVE MV Area (PHT): 3.77 cm    TR Peak grad:   25.8 mmHg MV  Area VTI:   2.46 cm    TR Vmax:        254.00 cm/s MV Peak grad:  8.0 mmHg MV Mean grad:  2.0 mmHg    SHUNTS MV Vmax:       1.41 m/s    Systemic VTI:  0.17 m MV Vmean:      72.0 cm/s   Systemic Diam: 1.90 cm MV Decel Time: 201 msec MV E velocity: 68.80 cm/s MV A velocity: 98.90 cm/s MV E/A ratio:  0.70 Charlton Haws MD Electronically signed by Charlton Haws MD Signature Date/Time: 08/24/2022/12:24:16 PM    Final    CT Head Wo Contrast  Result Date: 08/23/2022 CLINICAL DATA:  Syncope/presyncope, cerebrovascular cause suspected EXAM: CT HEAD WITHOUT CONTRAST TECHNIQUE: Contiguous axial images were obtained from the base of the skull through the vertex without intravenous contrast. RADIATION DOSE REDUCTION: This exam was performed according to the departmental dose-optimization program which includes automated exposure control, adjustment of the mA and/or kV according to patient size and/or use of iterative reconstruction technique. COMPARISON:  CT head 07/31/22 FINDINGS: Brain: No evidence of acute infarction, hemorrhage, hydrocephalus, extra-axial collection or mass lesion/mass effect. 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 Vascular: No hyperdense vessel or unexpected calcification. Skull: Normal. Negative for fracture or focal lesion. Sinuses/Orbits: No middle ear or mastoid effusion. Paranasal sinuses are clear. Bilateral lens replacement. Orbits are otherwise unremarkable. Other: None. IMPRESSION: 1. No acute intracranial abnormality. 2. 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   On: 08/23/2022 14:16       Emillia Weatherly M.D. [facility] 08/25/2022, 12:41 PM  Available via Epic secure chat 7am-7pm After 7 pm, please refer to night coverage provider listed on amion.

## 2022-08-25 NOTE — Op Note (Signed)
[facility] Patient Name: [name] Procedure Date: 08/25/2022 MRN: [id number] Attending MD: Jeani Hawking , MD, [id number] Date of Birth: 1955-05-14 CSN: [id number] Age: 67 Admit Type: Inpatient Procedure:                Upper GI endoscopy Indications:              Iron deficiency anemia, Heme positive stool, Melena Providers:                Jeani Hawking, MD, Lorenza Evangelist, RN, Salley Scarlet, Technician, Romie Minus, CRNA Referring MD:              Medicines:                Propofol per Anesthesia Complications:            No immediate complications. Estimated Blood Loss:     Estimated blood loss: none. Procedure:                Pre-Anesthesia Assessment:                           - Prior to the procedure, a History and Physical                            was performed, and patient medications and                            allergies were reviewed. The patient's tolerance of                            previous anesthesia was also reviewed. The risks                            and benefits of the procedure and the sedation                            options and risks were discussed with the patient.                            All questions were answered, and informed consent                            was obtained. Prior Anticoagulants: The patient has                            taken no anticoagulant or antiplatelet agents. ASA                            Grade Assessment: III - A patient with severe                            systemic disease. After reviewing the risks and  benefits, the patient was deemed in satisfactory                            condition to undergo the procedure.                           - Sedation was administered by an anesthesia                            professional. Deep sedation was attained.                           After obtaining informed consent, the endoscope was                             passed under direct vision. Throughout the                            procedure, the patient's blood pressure, pulse, and                            oxygen saturations were monitored continuously. The                            [id number]-H190 ([id number]) Olympus endoscope was introduced                            through the mouth, and advanced to the third part                            of duodenum. The upper GI endoscopy was                            accomplished without difficulty. The patient                            tolerated the procedure well. Scope In: Scope Out: Findings:      The esophagus was normal.      A few 5 to 7 mm sessile polyps with bleeding and no stigmata of recent       bleeding were found in the gastric body. The polyp was removed with a       cold snare. Resection and retrieval were complete. Coagulation for       hemostasis using monopolar probe was successful. To stop active       bleeding, one hemostatic clip was successfully placed (MR safe). Clip       manufacturer: [employer]. There was no bleeding at the end of the       procedure.      A single small angioectasia with bleeding was found in the second       portion of the duodenum. Coagulation for hemostasis using monopolar       probe was successful. Estimated blood loss: none. For hemostasis, two       hemostatic clips were successfully placed (MR safe). Clip manufacturer:       [employer]. There was  no bleeding at the end of the procedure.      In the gastric lumen there was evidence of dark bile. This was       suctioned, but there was no evidence of any active bleeding in the       gastric lumen. In the duodenal bulb more dark bile was encountered,       however, in D2 there was evidence of a patch of fresh blood. Washing the       area revealed a bleeding AVM. This was ablated with APC and two       hemoclips were secured onto the site. All the bleeding arrested. No       other  bleeding sites were found in the duodenum. Withdrawing into the       gastric lumen showed that she now had a bleeding gastric body polyp.       This was to be removed with a hot snare, but it was inadvertantly       resected without cautery. There was self-limited bleeding, but there was       suspicion for recurrent bleeding in the near term. Also, another friable       polyp was found nearby. Both areas were treated with APC and then a       hemoclip was placed across the snare polypectomy site after APC. No       futher bleeding was observed. Impression:               - Normal esophagus.                           - A few gastric polyps. Resected and retrieved.                            Treated with a monopolar probe. Clip manufacturer:                            [employer]. Clip (MR safe) was placed.                           - A single bleeding angioectasia in the duodenum.                            Treated with a monopolar probe. Clip manufacturer:                            [employer]. Clips (MR safe) were placed. Moderate Sedation:      Not Applicable - Patient had care per Anesthesia. Recommendation:           - Patient has a contact number available for                            emergencies. The signs and symptoms of potential                            delayed complications were discussed with the                            patient.  Return to normal activities tomorrow.                            Written discharge instructions were provided to the                            patient.                           - Resume previous diet.                           - Continue present medications.                           - Await pathology results.                           - Follow HGB and transfuse as necessary.                           - OK to resume coumadin. Procedure Code(s):        --- Professional ---                           [phone], 59,  Esophagogastroduodenoscopy, flexible,                            transoral; with control of bleeding, any method                           43251, Esophagogastroduodenoscopy, flexible,                            transoral; with removal of tumor(s), polyp(s), or                            other lesion(s) by snare technique Diagnosis Code(s):        --- Professional ---                           K31.7, Polyp of stomach and duodenum                           K31.811, Angiodysplasia of stomach and duodenum                            with bleeding                           D50.9, Iron deficiency anemia, unspecified                           R19.5, Other fecal abnormalities                           K92.1, Melena (includes Hematochezia) CPT copyright 2022 American [employer] Association. All rights reserved. The codes  documented in this report are preliminary and upon coder review may  be revised to meet current compliance requirements. Jeani Hawking, MD Jeani Hawking, MD 08/25/2022 2:19:51 PM This report has been signed electronically. Number of Addenda: 0

## 2022-08-25 NOTE — Progress Notes (Signed)
PT Cancellation Note  Patient Details Name: [name] MRN: [id number] DOB: 23-Jan-1956   Cancelled Treatment:    Reason Eval/Treat Not Completed: Patient at procedure or test/unavailable  Pt off floor for procedure/endoscopy.  Will f/u later date as able.  Anise Salvo, PT [facility] [phone]  Rayetta Humphrey 08/25/2022, 2:28 PM

## 2022-08-25 NOTE — Transfer of Care (Signed)
Immediate Anesthesia Transfer of Care Note  Patient: [name]  Procedure(s) Performed: ESOPHAGOGASTRODUODENOSCOPY (EGD) WITH PROPOFOL HOT HEMOSTASIS (ARGON PLASMA COAGULATION/BICAP) HEMOSTASIS CLIP PLACEMENT POLYPECTOMY  Patient Location: Endoscopy Unit  Anesthesia Type:MAC  Level of Consciousness: oriented and patient cooperative  Airway & Oxygen Therapy: Patient Spontanous Breathing and Patient connected to face mask oxygen  Post-op Assessment: Report given to RN and Post -op Vital signs reviewed and stable  Post vital signs: Reviewed  Last Vitals:  Vitals Value Taken Time  BP 114/54 08/25/22 1410  Temp    Pulse 81 08/25/22 1416  Resp 14 08/25/22 1416  SpO2 98 % 08/25/22 1416  Vitals shown include unvalidated device data.  Last Pain:  Vitals:   08/25/22 1410  TempSrc:   PainSc: 0-No pain      Patients Stated Pain [place]: 0 (08/24/22 0026)  Complications: No notable events documented.

## 2022-08-25 NOTE — Interval H&P Note (Signed)
History and Physical Interval Note:  08/25/2022 1:10 PM  [name]  has presented today for surgery, with the diagnosis of Melena and anemia.  The various methods of treatment have been discussed with the patient and family. After consideration of risks, benefits and other options for treatment, the patient has consented to  Procedure(s): ESOPHAGOGASTRODUODENOSCOPY (EGD) WITH PROPOFOL (N/A) as a surgical intervention.  The patient's history has been reviewed, patient examined, no change in status, stable for surgery.  I have reviewed the patient's chart and labs.  Questions were answered to the patient's satisfaction.     Fumiye Lubben D

## 2022-08-26 DIAGNOSIS — Z7901 Long term (current) use of anticoagulants: Secondary | ICD-10-CM | POA: Diagnosis not present

## 2022-08-26 DIAGNOSIS — I5032 Chronic diastolic (congestive) heart failure: Secondary | ICD-10-CM | POA: Diagnosis not present

## 2022-08-26 DIAGNOSIS — K922 Gastrointestinal hemorrhage, unspecified: Secondary | ICD-10-CM | POA: Diagnosis not present

## 2022-08-26 DIAGNOSIS — K921 Melena: Secondary | ICD-10-CM | POA: Diagnosis not present

## 2022-08-26 DIAGNOSIS — R55 Syncope and collapse: Secondary | ICD-10-CM | POA: Diagnosis not present

## 2022-08-26 LAB — GLUCOSE, CAPILLARY
Glucose-Capillary: 166 mg/dL — ABNORMAL HIGH (ref 70–99)
Glucose-Capillary: 206 mg/dL — ABNORMAL HIGH (ref 70–99)
Glucose-Capillary: 168 mg/dL — ABNORMAL HIGH (ref 70–99)
Glucose-Capillary: 201 mg/dL — ABNORMAL HIGH (ref 70–99)

## 2022-08-26 LAB — TYPE AND SCREEN
ABO/RH(D): A POS
Antibody Screen: POSITIVE
Donor AG Type: NEGATIVE
Unit division: 0
Unit division: 0

## 2022-08-26 LAB — PROTIME-INR
INR: 2.1 — ABNORMAL HIGH (ref 0.8–1.2)
Prothrombin Time: 23.6 seconds — ABNORMAL HIGH (ref 11.4–15.2)

## 2022-08-26 LAB — BASIC METABOLIC PANEL
Anion gap: 11 (ref 5–15)
BUN: 38 mg/dL — ABNORMAL HIGH (ref 8–23)
CO2: 23 mmol/L (ref 22–32)
Calcium: 8.1 mg/dL — ABNORMAL LOW (ref 8.9–10.3)
Chloride: 103 mmol/L (ref 98–111)
Creatinine, Ser: 1.02 mg/dL — ABNORMAL HIGH (ref 0.44–1.00)
GFR, Estimated: >60 mL/min (ref >60)
Glucose, Bld: 195 mg/dL — ABNORMAL HIGH (ref 70–99)
Potassium: 3.8 mmol/L (ref 3.5–5.1)
Sodium: 137 mmol/L (ref 135–145)

## 2022-08-26 LAB — CBC
HCT: 24.4 % — ABNORMAL LOW (ref 36.0–46.0)
Hemoglobin: 7.8 g/dL — ABNORMAL LOW (ref 12.0–15.0)
MCH: 27.8 pg (ref 26.0–34.0)
MCHC: 32 g/dL (ref 30.0–36.0)
MCV: 86.8 fL (ref 80.0–100.0)
Platelets: 260 10*3/uL (ref 150–400)
RBC: 2.81 MIL/uL — ABNORMAL LOW (ref 3.87–5.11)
RDW: 17.1 % — ABNORMAL HIGH (ref 11.5–15.5)
WBC: 27.3 10*3/uL — ABNORMAL HIGH (ref 4.0–10.5)
nRBC: 1.3 % — ABNORMAL HIGH (ref 0.0–0.2)

## 2022-08-26 LAB — BPAM RBC
Blood Product Expiration Date: 202407152359
ISSUE DATE / TIME: 202406071253
ISSUE DATE / TIME: 202406071512
Unit Type and Rh: 5100
Unit Type and Rh: 5100

## 2022-08-26 LAB — PROCALCITONIN: Procalcitonin: 0.18 ng/mL

## 2022-08-26 MED ORDER — OXYCODONE HCL 5 MG PO TABS
5.0000 mg | ORAL_TABLET | ORAL | Status: DC | PRN
  Administered 2022-08-26 – 2022-08-31 (×9): 5 mg via ORAL
  Filled 2022-08-26 (×11): qty 1

## 2022-08-26 MED ORDER — ZONISAMIDE 100 MG PO CAPS
200.0000 mg | ORAL_CAPSULE | Freq: Every day | ORAL | Status: DC
  Administered 2022-08-26 – 2022-08-31 (×6): 200 mg via ORAL
  Filled 2022-08-26 (×6): qty 2

## 2022-08-26 MED ORDER — CLONIDINE HCL 0.1 MG PO TABS
0.1000 mg | ORAL_TABLET | Freq: Two times a day (BID) | ORAL | Status: DC
  Administered 2022-08-26 – 2022-09-01 (×13): 0.1 mg via ORAL
  Filled 2022-08-26 (×13): qty 1

## 2022-08-26 MED ORDER — LINACLOTIDE 145 MCG PO CAPS
145.0000 ug | ORAL_CAPSULE | Freq: Every day | ORAL | Status: DC
  Administered 2022-08-26 – 2022-09-01 (×5): 145 ug via ORAL
  Filled 2022-08-26 (×8): qty 1

## 2022-08-26 MED ORDER — ZONISAMIDE 100 MG PO CAPS
100.0000 mg | ORAL_CAPSULE | Freq: Every day | ORAL | Status: DC
  Administered 2022-08-26 – 2022-09-01 (×7): 100 mg via ORAL
  Filled 2022-08-26 (×7): qty 1

## 2022-08-26 MED ORDER — FUROSEMIDE 40 MG PO TABS
80.0000 mg | ORAL_TABLET | Freq: Every day | ORAL | Status: DC
  Administered 2022-08-26 – 2022-09-01 (×7): 80 mg via ORAL
  Filled 2022-08-26 (×7): qty 2

## 2022-08-26 MED ORDER — GABAPENTIN 300 MG PO CAPS
300.0000 mg | ORAL_CAPSULE | Freq: Three times a day (TID) | ORAL | Status: DC
  Administered 2022-08-26 – 2022-09-01 (×18): 300 mg via ORAL
  Filled 2022-08-26 (×18): qty 1

## 2022-08-26 NOTE — Progress Notes (Signed)
   08/26/22 2239  BiPAP/CPAP/SIPAP  BiPAP/CPAP/SIPAP Pt Type Adult  BiPAP/CPAP/SIPAP Resmed  Mask Type Full face mask  Mask Size Large  Set Rate 0 breaths/min  Respiratory Rate 20 breaths/min  FiO2 (%) 21 %  Patient Home Equipment No  Auto Titrate Yes (16/6)  CPAP/SIPAP surface wiped down Yes  BiPAP/CPAP /SiPAP Vitals  Pulse Rate 91  Resp 20  SpO2 97 %  Bilateral Breath Sounds Clear  MEWS Score/Color  MEWS Score 0  MEWS Score Color [name]

## 2022-08-26 NOTE — Progress Notes (Signed)
Progress Note    ASSESSMENT AND PLAN:   Cross cover for Dr. Elnoria Howard EGD note from yesterday reviewed.  Melena d/t actively bleeding duodenal AVM s/p monopolar probe/clipping 6/7. Hb 7.8 today H/O DVT on coumadin C/O upper abdo pain  Plan: -Continue Protonix 40 mg p.o. twice daily -Trend CBC. Keep Hb>7 -Agree with CT Abdo/pelvis today -Will follow CT results     SUBJECTIVE   No further melena No further Bms Denies having any abdominal pain No nausea or vomiting    OBJECTIVE:     Vital signs in last 24 hours: Temp:  [97.7 F (36.5 C)-98.2 F (36.8 C)] 98 F (36.7 C) (06/08 1314) Pulse Rate:  [88-107] 88 (06/08 1314) Resp:  [16-22] 20 (06/08 1314) BP: (126-182)/(66-82) 151/74 (06/08 1314) SpO2:  [93 %-99 %] 98 % (06/08 1314) FiO2 (%):  [21 %] 21 % (06/07 2139) Last BM Date : 08/22/22 General:   Alert, well-developed female in NAD EENT:  Normal hearing, non icteric sclera, conjunctive pink.  Heart:  Regular rate and rhythm; no murmur.  No lower extremity edema   Pulm: Normal respiratory effort, lungs CTA bilaterally without wheezes or crackles. Abdomen:  Soft, nondistended, nontender.  Normal bowel sounds,.       Neurologic:  Alert and  oriented x4;  grossly normal neurologically. Psych:  Pleasant, cooperative.  Normal mood and affect.   Intake/Output from previous day: 06/07 0701 - 06/08 0700 In: 1108.3 [I.V.:493.3; Blood:615] Out: -  Intake/Output this shift: Total I/O In: 118 [P.O.:118] Out: -   Lab Results: Recent Labs    08/24/22 0539 08/25/22 0625 08/26/22 0500  WBC 11.0* 13.8* 27.3*  HGB 8.3* 6.1* 7.8*  HCT 26.8* 20.6* 24.4*  PLT 318 263 260   BMET Recent Labs    08/24/22 0539 08/25/22 0625 08/26/22 0500  NA 135 139 137  K 3.3* 3.8 3.8  CL 101 106 103  CO2 24 21* 23  GLUCOSE 146* 190* 195*  BUN 47* 57* 38*  CREATININE 0.87 0.89 1.02*  CALCIUM 7.7* 8.3* 8.1*   LFT No results for input(s): "PROT", "ALBUMIN", "AST", "ALT",  "ALKPHOS", "BILITOT", "BILIDIR", "IBILI" in the last 72 hours. PT/INR Recent Labs    08/25/22 0625 08/26/22 0500  LABPROT 26.6* 23.6*  INR 2.4* 2.1*   Hepatitis Panel No results for input(s): "HEPBSAG", "HCVAB", "HEPAIGM", "HEPBIGM" in the last 72 hours.  CT KNEE LEFT WO CONTRAST  Result Date: 08/25/2022 CLINICAL DATA:  Knee replacement, infection suspected. Postop total knee replacement. EXAM: CT OF THE LEFT KNEE WITHOUT CONTRAST TECHNIQUE: Multidetector CT imaging of the left knee was performed according to the standard protocol. Multiplanar CT image reconstructions were also generated. RADIATION DOSE REDUCTION: This exam was performed according to the departmental dose-optimization program which includes automated exposure control, adjustment of the mA and/or kV according to patient size and/or use of iterative reconstruction technique. COMPARISON:  Radiographs dated August 23, 2022 FINDINGS: Bones/Joint/Cartilage Status post left knee arthroplasty with intact hardware. No perihardware loosening or erosive changes. Small joint effusion. Ligaments Suboptimally assessed by CT. Muscles and Tendons Muscles are normal in bulk. No evidence of intramuscular fluid collection or infectious or inflammatory process. Quadriceps and patellar tendon appear intact. Soft tissues Skin and subcutaneous soft tissues are within normal limits. No fluid collection or abscess. IMPRESSION: 1. Status post left knee arthroplasty with intact hardware. No perihardware loosening or erosive changes. No CT evidence to suggest infectious or inflammatory process. Joint aspiration could be considered if there  is persistent clinical concern. 2. Small joint effusion. 3. Muscles and subcutaneous soft tissues are within normal limits. Electronically Signed   By: Larose Hires D.O.   On: 08/25/2022 20:43     Principal Problem:   Near syncope Active Problems:   HTN (hypertension)   Bilateral deep vein thromboses (HCC)   Chronic  anticoagulation   Supratherapeutic INR   Chronic diastolic CHF (congestive heart failure) (HCC)   Mixed hyperlipidemia   Normocytic anemia   Left knee pain     LOS: 2 days     Edman Circle, MD 08/26/2022, 3:42 PM [facility] [phone]

## 2022-08-26 NOTE — Progress Notes (Signed)
[facility]                                                                              Mykaylah Sant, is a 67 y.o. female, DOB - 11-19-1955, WJX:[id number] Admit date - 08/23/2022    Outpatient Primary MD for the patient is [name], MD  LOS - 2  days  Chief Complaint  Patient presents with   Fall   Dizziness       Brief summary   Patient is a 67 year old female with diabetes mellitus type 2, diabetes mellitus type 2, COPD, history of DVTs on Coumadin, history of clotting disorder, GERD, HTN, HLP, OSA, peripheral neuropathy presented to ED with dizziness.  Patient reported dizziness that started a day before admission and left knee almost gave out.  She has been having left knee pain and has been operated on previously by Dr. August Saucer.  On the day of admission, she felt more dizzy, diaphoretic, no chest pain abdominal pain or any headaches.  She continued to have constant knee pain, called Dr. Lyndle Herrlich office and had an appointment however could not make it to the appointment due to dizziness.   Patient denies taking NSAIDs for the knee pain.  Takes PPI for GERD  No nausea, vomiting, abdominal pain or diarrhea.  Patient takes Coumadin for history of DVTs, followed by Dr. Alcide Evener.  Per patient, she was having low INR hence she had recently increased her Coumadin.  In ED, temp 98.3, RR 27, heart rate 93, BP initially 133/82, then 111/97, O2 sats 97% on room air CMET unremarkable, CBC showed hemoglobin 9.9, was 11.6 on 06/22/2022 INR 7.0 FOBT positive   Assessment & Plan    Principal Problem:   Near syncope -Patient noted to be borderline BP on multiple antihypertensives outpatient (clonidine, Cardizem, Lasix, Aldactone), held on admission.   -Patient received 500 cc IVF bolus in ED -Troponins x 2 negative.  EKG did not show acute ST-T wave changes suggestive of ischemia.  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  GI bleed -FOBT positive with supratherapeutic INR.  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  Obtain CT abdomen pelvis, will rule out RPH as well, hold off on resuming Coumadin until ruled out  Acute on chronic left knee pain, fibromyalgia  -Patient has a prior history of left knee arthroplasty.  X-ray showed small suprapatellar joint effusion, no signs of periprosthetic fracture loosening or dislocation -CRP 2.4, ESR 81, uric acid 6.1 -Continue pain control, orthopedics following.  -CT left knee showed arthroplasty with intact hardware, no erosive changes, no CT evidence to suggest infectious or inflammatory process, joint aspiration can be considered. -Patient working with PT however acute knee pain prohibiting full benefit from physical therapy.   -Currently on Cymbalta 90 mg daily, Robaxin 500 mg 3 times daily, Lidoderm patch, tramadol not really helping -Will try oxycodone, continue IV  Dilaudid for severe pain -Increase gabapentin to 3 times daily, added oxycodone 5 mg every 4 hours as needed -Will await orthopedic recommendations     Chronic diastolic CHF (congestive heart failure) (HCC) -2D echo 6/6 showed EF of 65-70%, G1 DD -Currently euvolemic      Mixed hyperlipidemia -Continue statin  Obesity Estimated body mass index is 45.51 kg/m as calculated from the following:   Height as of this encounter: 5\' 6"  (1.676 m).   Weight as of this encounter: 127.9 kg.  Code Status: Full code DVT Prophylaxis:  SCDs Start: 08/23/22 1948   Level of Care: Level of care: Telemetry Family Communication: Updated patient's daughter on phone yesterday Disposition Plan:      Remains inpatient appropriate:     Procedures:  EGD  Consultants:   GI Orthopedics Cardiology  Antimicrobials: None   Medications  sodium chloride   Intravenous Once   diltiazem  240 mg Oral QAC breakfast   DULoxetine  90 mg Oral QHS   fluticasone  2 spray Each Nare BID   gabapentin  300 mg Oral 2 times per day   insulin aspart  0-15 Units Subcutaneous TID WC   insulin aspart  0-5 Units Subcutaneous QHS   lidocaine  1 patch Transdermal Q24H   linaclotide  145 mcg Oral QAC breakfast   methocarbamol  500 mg Oral TID   mometasone-formoterol  2 puff Inhalation BID   pantoprazole  40 mg Oral BID AC   pravastatin  40 mg Oral QPM   QUEtiapine  50 mg Oral BID   zonisamide  100 mg Oral Daily   zonisamide  200 mg Oral QHS      Subjective:   Shawnell Hendriks was seen and examined today.  Working with PT however  miserable with left knee pain, today also having abdominal pain.  No nausea or vomiting, fevers or chills.   Objective:   Vitals:   08/26/22 0027 08/26/22 0417 08/26/22 0743 08/26/22 0820  BP: 126/79 (!) 143/66  (!) 182/80  Pulse: 96 (!) 103  93  Resp: 16 16  18   Temp: 98.1 F (36.7 C) 98.2 F (36.8 C)  98 F (36.7 C)  TempSrc: Oral   Oral  SpO2: 98% 93% 94% 98%  Weight:       Height:        Intake/Output Summary (Last 24 hours) at 08/26/2022 1202 Last data filed at 08/26/2022 1610 Gross per 24 hour  Intake 1226.33 ml  Output --  Net 1226.33 ml     Wt Readings from Last 3 Encounters:  08/23/22 127.9 kg  07/29/22 127.9 kg  07/27/22 131.1 kg   Physical Exam General: Alert and oriented x 3, NAD,  feels miserable and uncomfortable with the knee pain Cardiovascular: S1 S2 clear, RRR.  Respiratory: CTAB, no wheezing, rales or rhonchi Gastrointestinal: Soft, mild diffuse TTP, nondistended, NBS Ext: no pedal edema bilaterally, difficult to bend left knee, swollen Neuro: no new deficits Psych: Normal affect   Data Reviewed:  I have personally reviewed following labs    CBC Lab Results  Component Value Date   WBC 27.3 (H) 08/26/2022   RBC 2.81 (L) 08/26/2022   HGB 7.8 (L) 08/26/2022   HCT 24.4 (L) 08/26/2022   MCV 86.8 08/26/2022   MCH 27.8 08/26/2022   PLT 260 08/26/2022   MCHC 32.0 08/26/2022   RDW 17.1 (H) 08/26/2022   LYMPHSABS 2.2 08/23/2022   MONOABS 0.7 08/23/2022   EOSABS 0.1 08/23/2022   BASOSABS 0.0 08/23/2022     Last metabolic panel Lab Results  Component Value Date   NA 137 08/26/2022   K 3.8 08/26/2022   CL 103 08/26/2022   CO2 23 08/26/2022   BUN 38 (H) 08/26/2022   CREATININE 1.02 (H) 08/26/2022   GLUCOSE 195 (H) 08/26/2022   GFRNONAA >60 08/26/2022   GFRAA 94 03/05/2020   CALCIUM 8.1 (L) 08/26/2022   PHOS 2.7 06/12/2014   PROT 7.7 08/23/2022   ALBUMIN 3.4 (L) 08/23/2022   BILITOT 0.1 (L) 08/23/2022   ALKPHOS 65 08/23/2022   AST 16 08/23/2022   ALT 19 08/23/2022   ANIONGAP 11 08/26/2022    CBG (last 3)  Recent Labs    08/25/22 1633 08/25/22 2056 08/26/22 0744  GLUCAP 152* 184* 166*      Coagulation Profile: Recent Labs  Lab 08/23/22 2117 08/24/22 0539 08/24/22 1227 08/25/22 0625 08/26/22 0500  INR 6.2* 4.9* 4.0* 2.4* 2.1*     Radiology Studies: I have personally reviewed the imaging studies   CT KNEE LEFT WO CONTRAST  Result Date: 08/25/2022 CLINICAL DATA:  Knee replacement, infection suspected. Postop total knee replacement. EXAM: CT OF THE LEFT KNEE WITHOUT CONTRAST TECHNIQUE: Multidetector CT imaging of the left knee was performed according to the standard protocol. Multiplanar CT image reconstructions were also generated. RADIATION DOSE REDUCTION: This exam was performed according to the departmental dose-optimization program which includes automated exposure control, adjustment of the mA and/or kV according to patient size and/or use of iterative reconstruction technique. COMPARISON:  Radiographs dated August 23, 2022 FINDINGS: Bones/Joint/Cartilage Status post left knee arthroplasty with intact hardware. No perihardware loosening or erosive changes. Small joint effusion. Ligaments Suboptimally assessed by CT. Muscles and Tendons Muscles are normal in bulk. No evidence of intramuscular fluid collection or infectious or inflammatory process. Quadriceps and patellar tendon appear intact. Soft tissues Skin and subcutaneous soft tissues are within normal limits. No fluid collection or abscess. IMPRESSION: 1. Status post left knee arthroplasty with intact hardware. No perihardware loosening or erosive changes. No CT evidence to suggest infectious or inflammatory process. Joint aspiration could be considered if there is persistent clinical concern. 2. Small joint effusion. 3. Muscles and subcutaneous soft tissues are within normal limits. Electronically Signed   By: Larose Hires D.O.   On: 08/25/2022 20:43       Vanessa Kampf M.D. [facility] 08/26/2022, 12:02 PM  Available via Epic secure chat 7am-7pm After 7 pm, please refer to night coverage provider listed on amion.

## 2022-08-26 NOTE — Evaluation (Signed)
Occupational Therapy Evaluation Patient Details Name: [name] MRN: [id number] DOB: 06-Nov-1955 Today's Date: 08/26/2022   History of Present Illness Pt is an 67 y/o F admitted on 08/23/23 after presenting with c/o dizziness & L knee pain. Pt is being treated for near syncope. Pt s/p upper GI endoscopy 6/7 and received PRBCs 6/7. PMH: DM2, COPD, DVTs on coumadin, clotting disorder, GERD, HTN, HLP, OSA, peripheral neuropathy, heart murmr   Clinical Impression   Patient is a 67 year old female who was admitted for above. Patient was living at home at rollator level with support from caregiver daily for a few hours as noted below. Patient currently continues to have orthostatic BP, increased pain in L knee, and increased fear of falling impacting participation in ADLs. Patient was noted to have decreased functional activity tolerance, decreased endurance, decreased standing balance, decreased safety awareness, and decreased knowledge of AD/AE impacting participation in ADLs. Patient would continue to benefit from skilled OT services at this time while admitted and after d/c to address noted deficits in order to improve overall safety and independence in ADLs.       Recommendations for follow up therapy are one component of a multi-disciplinary discharge planning process, led by the attending physician.  Recommendations may be updated based on patient status, additional functional criteria and insurance authorization.   Assistance Recommended at Discharge Frequent or constant Supervision/Assistance  Patient can return home with the following Two people to help with walking and/or transfers;Assistance with cooking/housework;A lot of help with bathing/dressing/bathroom;Direct supervision/assist for medications management;Assist for transportation;Help with stairs or ramp for entrance;Direct supervision/assist for financial management    Functional Status Assessment  Patient has had a recent  decline in their functional status and demonstrates the ability to make significant improvements in function in a reasonable and predictable amount of time.  Equipment Recommendations  None recommended by OT       Precautions / Restrictions Precautions Precautions: Fall Precaution Comments: watch BP Restrictions Weight Bearing Restrictions: No      Mobility Bed Mobility Overal bed mobility: Needs Assistance Bed Mobility: Supine to Sit     Supine to sit: Min guard Sit to supine: Supervision, HOB elevated   General bed mobility comments: increased effort and time, use of bedrail to upright trunk into sitting    Transfers Overall transfer level: Needs assistance Equipment used: Rolling walker (2 wheels) Transfers: Sit to/from Stand Sit to Stand: Min guard, Min assist, +2 safety/equipment           General transfer comment: pt powers up from elevated bed with min G +2 for safety due to L knee buckling history and pt reporting sensation present; min A+2 for safety to power up from lower seated recliner      Balance Overall balance assessment: Needs assistance Sitting-balance support: Feet supported Sitting balance-Leahy Scale: Good     Standing balance support: During functional activity, Reliant on assistive device for balance, Bilateral upper extremity supported Standing balance-Leahy Scale: Poor           ADL either performed or assessed with clinical judgement   ADL Overall ADL's : Needs assistance/impaired Eating/Feeding: Set up;Sitting   Grooming: Sitting;Min guard   Upper Body Bathing: Sitting;Minimal assistance   Lower Body Bathing: Sitting/lateral leans;Total assistance   Upper Body Dressing : Sitting;Minimal assistance   Lower Body Dressing: Maximal assistance;Sit to/from stand;Sitting/lateral leans Lower Body Dressing Details (indicate cue type and reason): unable to take hands off walker with increased pain in L knee  and fear of buckling. Toilet  Transfer: +2 for safety/equipment;+2 for physical assistance;Minimal assistance;Ambulation;Rolling walker (2 wheels) Toilet Transfer Details (indicate cue type and reason): with increased time. patient able to take a few steps then reporting onset of dizziness. BP was taken with signficant drop from intial BP taken. MD came into room and made aware during session. Toileting- Clothing Manipulation and Hygiene: Total assistance;Sit to/from stand               Vision   Vision Assessment?: No apparent visual deficits            Pertinent Vitals/Pain Pain Assessment Pain Assessment: 0-10 Pain Score: 7  Pain Location: L knee Pain Descriptors / Indicators: Discomfort Pain Intervention(s): Limited activity within patient's tolerance, Monitored during session, Repositioned, Premedicated before session     Hand Dominance Left   Extremity/Trunk Assessment Upper Extremity Assessment Upper Extremity Assessment: Overall WFL for tasks assessed   Lower Extremity Assessment Lower Extremity Assessment: Defer to PT evaluation   Cervical / Trunk Assessment Cervical / Trunk Assessment: Normal   Communication Communication Communication: No difficulties   Cognition Arousal/Alertness: Awake/alert Behavior During Therapy: WFL for tasks assessed/performed Overall Cognitive Status: Within Functional Limits for tasks assessed           General Comments: motivated to participate, eager to return home     General Comments  Pt reports dizziness with limited steps in room, returned to sitting and BP 134/78 - notified MD    Exercises     Shoulder Instructions      Home Living Family/patient expects to be discharged to:: Private residence Living Arrangements: Alone Available Help at Discharge: Personal care attendant Type of Home: Apartment Home Access: Level entry     Home Layout: One level     Bathroom Shower/Tub: Tub/shower unit         Home Equipment: Rollator (4  wheels)   Additional Comments: Pt reports having a PCA Monday-Friday 12:15-3:15pm, Saturday 8-10am, Sunday 8-9:30am who assists with bathing & cleaning.      Prior Functioning/Environment Prior Level of Function : Needs assist             Mobility Comments: Pt reports she's ambulatory with rollator, denies falls. Pt notes she drives some but has cut back a lot 2/2 hx of seizures (notes they will occur if she's laughing too much). ADLs Comments: Pt reports she's able to toilet & dress without assistance. has caregiver A for IADLs        OT Problem List: Decreased activity tolerance;Impaired balance (sitting and/or standing);Decreased coordination;Decreased safety awareness;Decreased knowledge of precautions;Pain;Decreased knowledge of use of DME or AE;Cardiopulmonary status limiting activity      OT Treatment/Interventions: Self-care/ADL training;Energy conservation;Therapeutic exercise;DME and/or AE instruction;Therapeutic activities;Patient/family education;Balance training    OT Goals(Current goals can be found in the care plan section) Acute Rehab OT Goals Patient Stated [place]: to get pain under control OT [place] Formulation: With patient Time For [place] Achievement: 09/09/22 Potential to Achieve Goals: Fair  OT Frequency: Min 2X/week    Co-evaluation PT/OT/SLP Co-Evaluation/Treatment: Yes Reason for Co-Treatment: For patient/therapist safety;To address functional/ADL transfers PT goals addressed during session: Mobility/safety with mobility;Balance;Proper use of DME OT goals addressed during session: ADL's and self-care      AM-PAC OT "6 Clicks" Daily Activity     Outcome Measure Help from another person eating meals?: None Help from another person taking care of personal grooming?: A Little Help from another person toileting, which includes using toliet, bedpan, or  urinal?: A Lot Help from another person bathing (including washing, rinsing, drying)?: A Lot Help from  another person to put on and taking off regular upper body clothing?: A Little Help from another person to put on and taking off regular lower body clothing?: A Lot 6 Click Score: 16   End of Session Equipment Utilized During Treatment: Gait belt;Rolling walker (2 wheels) Nurse Communication: Mobility status  Activity Tolerance: Patient limited by fatigue;Patient limited by pain Patient left: in chair;with call bell/phone within reach;with chair alarm set  OT Visit Diagnosis: Unsteadiness on feet (R26.81);Other abnormalities of gait and mobility (R26.89);Pain Pain - Right/Left: Left Pain - part of body: Knee                Time: 4782-9562 OT Time Calculation (min): 33 min Charges:  OT General Charges $OT Visit: 1 Visit OT Evaluation $OT Eval Moderate Complexity: 1 Mod  Kelvyn Schunk OTR/L, MS Acute Rehabilitation Department Office# [phone]   Selinda Flavin 08/26/2022, 12:41 PM

## 2022-08-26 NOTE — Progress Notes (Signed)
Physical Therapy Treatment Patient Details Name: [name] MRN: [id number] DOB: 10-27-1955 Today's Date: 08/26/2022   History of Present Illness Pt is an 67 y/o F admitted on 08/23/23 after presenting with c/o dizziness & L knee pain. Pt is being treated for near syncope. Pt s/p upper GI endoscopy 6/7 and received PRBCs 6/7. 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 Pt needing increased time and effort to come to sitting EOB without physical assist, using bedrail to assist trunk. Pt powers to stand with min G +2 for safety from elevated bed, reporting L knee buckling sensation but no buckling occurrs. Pt with initial difficulty performing static marching in front of bed, difficulty weight shifting to L leg using strong UE support on RW, also difficulty marching LLE up, but both improve with time and reps. Pt amb 3 ft in room with close recliner follow and +2 for safety due to knee buckling sensation. Pt needing to sit due to knee pain and dizziness complaints. MD in room aware of pt dizziness complaints, BP and limited amb due to L knee pain.    Recommendations for follow up therapy are one component of a multi-disciplinary discharge planning process, led by the attending physician.  Recommendations may be updated based on patient status, additional functional criteria and insurance authorization.  Follow Up Recommendations       Assistance Recommended at Discharge Intermittent Supervision/Assistance  Patient can return home with the following A little help with walking and/or transfers;A little help with bathing/dressing/bathroom;Assistance with cooking/housework;Assist for transportation;Help with stairs or ramp for entrance   Equipment Recommendations  None recommended by PT    Recommendations for Other Services        Precautions / Restrictions Precautions Precautions: Fall Precaution Comments: watch BP Restrictions Weight Bearing Restrictions: No     Mobility  Bed Mobility Overal bed mobility: Needs Assistance Bed Mobility: Supine to Sit     Supine to sit: Min guard     General bed mobility comments: increased effort and time, use of bedrail to upright trunk into sitting    Transfers Overall transfer level: Needs assistance Equipment used: Rolling walker (2 wheels) Transfers: Sit to/from Stand Sit to Stand: Min guard, Min assist, +2 safety/equipment           General transfer comment: pt powers up from elevated bed with min G +2 for safety due to L knee buckling history and pt reporting sensation present; min A+2 for safety to power up from lower seated recliner    Ambulation/Gait Ambulation/Gait assistance: Min guard, +2 safety/equipment Gait Distance (Feet): 3 Feet Assistive device: Rolling walker (2 wheels) Gait Pattern/deviations: Step-to pattern, Decreased weight shift to left, Shuffle Gait velocity: decrease     General Gait Details: initial difficulty marching each foot up, more difficulty with lifting L leg, heavy use of BUE to when marching RLE, foot clearance improves with time in standing, pt ambulates short distance forward with close recliner follow, no knee buckling ocurred but pt reports sensatino present, reporting dizziness needing seated rest break   Stairs             Wheelchair Mobility    Modified Rankin (Stroke Patients Only)       Balance Overall balance assessment: Needs assistance Sitting-balance support: Feet supported Sitting balance-Leahy Scale: Good  Standing balance support: During functional activity, Reliant on assistive device for balance, Bilateral upper extremity supported Standing balance-Leahy Scale: Poor                              Cognition Arousal/Alertness: Awake/alert Behavior During Therapy:  WFL for tasks assessed/performed Overall Cognitive Status: Within Functional Limits for tasks assessed                                 General Comments: motivated to participate, eager to return home        Exercises General Exercises - Lower Extremity Long Arc Quad: Seated, AROM, Both, 10 reps    General Comments General comments (skin integrity, edema, etc.): Pt reports dizziness with limited steps in room, returned to sitting and BP 134/78 - notified MD      Pertinent Vitals/Pain Pain Assessment Pain Assessment: 0-10 Pain Score: 7  Pain Location: L knee Pain Descriptors / Indicators: Discomfort ("feels like it's going to break off") Pain Intervention(s): Limited activity within patient's tolerance, Monitored during session, Repositioned    Home Living Family/patient expects to be discharged to:: Private residence Living Arrangements: Alone Available Help at Discharge: Personal care attendant Type of Home: Apartment Home Access: Level entry       Home Layout: One level Home Equipment: Rollator (4 wheels) Additional Comments: Pt reports having a PCA Monday-Friday 12:15-3:15pm, Saturday 8-10am, Sunday 8-9:30am who assists with bathing & cleaning.    Prior Function            PT Goals (current goals can now be found in the care plan section) Acute Rehab PT Goals Patient Stated [place]: get better, return home PT [place] Formulation: With patient Time For [place] Achievement: 09/07/22 Potential to Achieve Goals: Good Progress towards PT goals: Progressing toward goals    Frequency    Min 3X/week      PT Plan Current plan remains appropriate    Co-evaluation PT/OT/SLP Co-Evaluation/Treatment: Yes Reason for Co-Treatment: For patient/therapist safety;To address functional/ADL transfers PT goals addressed during session: Mobility/safety with mobility;Balance;Proper use of DME        AM-PAC PT "6 Clicks" Mobility   Outcome Measure  Help needed  turning from your back to your side while in a flat bed without using bedrails?: A Little Help needed moving from lying on your back to sitting on the side of a flat bed without using bedrails?: A Little Help needed moving to and from a bed to a chair (including a wheelchair)?: A Lot Help needed standing up from a chair using your arms (e.g., wheelchair or bedside chair)?: A Lot Help needed to walk in hospital room?: Total Help needed climbing 3-5 steps with a railing? : Total 6 Click Score: 12    End of Session Equipment Utilized During Treatment: Gait belt Activity Tolerance: Patient tolerated treatment well;Patient limited by pain Patient left: in chair;with call bell/phone within reach;with chair alarm set Nurse Communication: Mobility status PT Visit Diagnosis: Pain;Muscle weakness (generalized) (M62.81);Other abnormalities of gait and mobility (R26.89);Difficulty in walking, not elsewhere classified (R26.2) Pain - Right/Left: Left Pain - part of body: Knee     Time: 9147-8295 PT Time Calculation (min) (ACUTE ONLY): 34 min  Charges:  $Therapeutic Activity: 8-22 mins                      Tori Jeniffer Culliver PT,  DPT 08/26/22, 9:32 AM

## 2022-08-27 ENCOUNTER — Inpatient Hospital Stay (HOSPITAL_COMMUNITY): Payer: 59

## 2022-08-27 DIAGNOSIS — K921 Melena: Secondary | ICD-10-CM | POA: Diagnosis not present

## 2022-08-27 DIAGNOSIS — R55 Syncope and collapse: Secondary | ICD-10-CM | POA: Diagnosis not present

## 2022-08-27 DIAGNOSIS — I82403 Acute embolism and thrombosis of unspecified deep veins of lower extremity, bilateral: Secondary | ICD-10-CM | POA: Diagnosis not present

## 2022-08-27 DIAGNOSIS — Z7901 Long term (current) use of anticoagulants: Secondary | ICD-10-CM | POA: Diagnosis not present

## 2022-08-27 DIAGNOSIS — K922 Gastrointestinal hemorrhage, unspecified: Secondary | ICD-10-CM | POA: Diagnosis not present

## 2022-08-27 LAB — BASIC METABOLIC PANEL
Anion gap: 10 (ref 5–15)
BUN: 28 mg/dL — ABNORMAL HIGH (ref 8–23)
CO2: 24 mmol/L (ref 22–32)
Calcium: 8 mg/dL — ABNORMAL LOW (ref 8.9–10.3)
Chloride: 104 mmol/L (ref 98–111)
Creatinine, Ser: 0.74 mg/dL (ref 0.44–1.00)
GFR, Estimated: >60 mL/min (ref >60)
Glucose, Bld: 169 mg/dL — ABNORMAL HIGH (ref 70–99)
Potassium: 3.4 mmol/L — ABNORMAL LOW (ref 3.5–5.1)
Sodium: 138 mmol/L (ref 135–145)

## 2022-08-27 LAB — GLUCOSE, CAPILLARY
Glucose-Capillary: 140 mg/dL — ABNORMAL HIGH (ref 70–99)
Glucose-Capillary: 138 mg/dL — ABNORMAL HIGH (ref 70–99)
Glucose-Capillary: 163 mg/dL — ABNORMAL HIGH (ref 70–99)
Glucose-Capillary: 151 mg/dL — ABNORMAL HIGH (ref 70–99)

## 2022-08-27 LAB — CBC
HCT: 23.6 % — ABNORMAL LOW (ref 36.0–46.0)
Hemoglobin: 7.5 g/dL — ABNORMAL LOW (ref 12.0–15.0)
MCH: 28.1 pg (ref 26.0–34.0)
MCHC: 31.8 g/dL (ref 30.0–36.0)
MCV: 88.4 fL (ref 80.0–100.0)
Platelets: 266 10*3/uL (ref 150–400)
RBC: 2.67 MIL/uL — ABNORMAL LOW (ref 3.87–5.11)
RDW: 17.6 % — ABNORMAL HIGH (ref 11.5–15.5)
WBC: 17.1 10*3/uL — ABNORMAL HIGH (ref 4.0–10.5)
nRBC: 2.6 % — ABNORMAL HIGH (ref 0.0–0.2)

## 2022-08-27 LAB — PROTIME-INR
INR: 1.5 — ABNORMAL HIGH (ref 0.8–1.2)
Prothrombin Time: 18.7 seconds — ABNORMAL HIGH (ref 11.4–15.2)

## 2022-08-27 MED ORDER — IOHEXOL 9 MG/ML PO SOLN
500.0000 mL | ORAL | Status: AC
  Administered 2022-08-27 (×2): 500 mL via ORAL

## 2022-08-27 MED ORDER — POTASSIUM CHLORIDE CRYS ER 20 MEQ PO TBCR
40.0000 meq | EXTENDED_RELEASE_TABLET | Freq: Once | ORAL | Status: AC
  Administered 2022-08-27: 40 meq via ORAL
  Filled 2022-08-27: qty 2

## 2022-08-27 MED ORDER — IOHEXOL 9 MG/ML PO SOLN
ORAL | Status: AC
  Filled 2022-08-27: qty 1000

## 2022-08-27 MED ORDER — INSULIN GLARGINE-YFGN 100 UNIT/ML ~~LOC~~ SOLN
10.0000 [IU] | Freq: Every day | SUBCUTANEOUS | Status: DC
  Administered 2022-08-27 – 2022-08-31 (×5): 10 [IU] via SUBCUTANEOUS
  Filled 2022-08-27 (×6): qty 0.1

## 2022-08-27 NOTE — Progress Notes (Addendum)
Progress Note  Primary GI: Dr. Elnoria Howard  LOS: 3 days   Chief Complaint:GI bleed, RLQ pain   Subjective   Patient reports continued RLQ pain that is intermittent and radiates down right leg. Reports when she has this pain she has been told it is due to her kidneys. Reports no further bleeding. Denies nausea/vomiting.   Objective   Vital signs in last 24 hours: Temp:  [98 F (36.7 C)-98.2 F (36.8 C)] 98.2 F (36.8 C) (06/09 0536) Pulse Rate:  [82-91] 87 (06/09 0813) Resp:  [20] 20 (06/09 0536) BP: (143-178)/(60-81) 143/60 (06/09 0813) SpO2:  [95 %-98 %] 95 % (06/09 0743) FiO2 (%):  [21 %] 21 % (06/08 2239) Last BM Date : 08/22/22 Last BM recorded by nurses in past 5 days Stool Type: Type 4 (Like a smooth, soft sausage or snake) (08/24/2022  1:00 PM)  General:   female in no acute distress  Heart:  Regular rate and rhythm; no murmurs Pulm: Clear anteriorly; no wheezing Abdomen: soft, nondistended, normal bowel sounds in all quadrants. Nontender without guarding. No organomegaly appreciated. Extremities:  No edema Neurologic:  Alert and  oriented x4;  No focal deficits.  Psych:  Cooperative. Normal mood and affect.  Intake/Output from previous day: 06/08 0701 - 06/09 0700 In: 118 [P.O.:118] Out: -  Intake/Output this shift: No intake/output data recorded.  Studies/Results: CT KNEE LEFT WO CONTRAST  Result Date: 08/25/2022 CLINICAL DATA:  Knee replacement, infection suspected. Postop total knee replacement. EXAM: CT OF THE LEFT KNEE WITHOUT CONTRAST TECHNIQUE: Multidetector CT imaging of the left knee was performed according to the standard protocol. Multiplanar CT image reconstructions were also generated. RADIATION DOSE REDUCTION: This exam was performed according to the departmental dose-optimization program which includes automated exposure control, adjustment of the mA and/or kV according to patient size and/or use of iterative reconstruction technique. COMPARISON:   Radiographs dated August 23, 2022 FINDINGS: Bones/Joint/Cartilage Status post left knee arthroplasty with intact hardware. No perihardware loosening or erosive changes. Small joint effusion. Ligaments Suboptimally assessed by CT. Muscles and Tendons Muscles are normal in bulk. No evidence of intramuscular fluid collection or infectious or inflammatory process. Quadriceps and patellar tendon appear intact. Soft tissues Skin and subcutaneous soft tissues are within normal limits. No fluid collection or abscess. IMPRESSION: 1. Status post left knee arthroplasty with intact hardware. No perihardware loosening or erosive changes. No CT evidence to suggest infectious or inflammatory process. Joint aspiration could be considered if there is persistent clinical concern. 2. Small joint effusion. 3. Muscles and subcutaneous soft tissues are within normal limits. Electronically Signed   By: Larose Hires D.O.   On: 08/25/2022 20:43    Lab Results: Recent Labs    08/25/22 0625 08/26/22 0500 08/27/22 0550  WBC 13.8* 27.3* 17.1*  HGB 6.1* 7.8* 7.5*  HCT 20.6* 24.4* 23.6*  PLT 263 260 266   BMET Recent Labs    08/25/22 0625 08/26/22 0500 08/27/22 0550  NA 139 137 138  K 3.8 3.8 3.4*  CL 106 103 104  CO2 21* 23 24  GLUCOSE 190* 195* 169*  BUN 57* 38* 28*  CREATININE 0.89 1.02* 0.74  CALCIUM 8.3* 8.1* 8.0*   LFT No results for input(s): "PROT", "ALBUMIN", "AST", "ALT", "ALKPHOS", "BILITOT", "BILIDIR", "IBILI" in the last 72 hours. PT/INR Recent Labs    08/25/22 0625 08/26/22 0500  LABPROT 26.6* 23.6*  INR 2.4* 2.1*     Scheduled Meds:  sodium chloride   Intravenous Once  cloNIDine  0.1 mg Oral BID   diltiazem  240 mg Oral QAC breakfast   DULoxetine  90 mg Oral QHS   fluticasone  2 spray Each Nare BID   furosemide  80 mg Oral Daily   gabapentin  300 mg Oral TID   insulin aspart  0-15 Units Subcutaneous TID WC   insulin aspart  0-5 Units Subcutaneous QHS   insulin glargine-yfgn  10 Units  Subcutaneous QHS   lidocaine  1 patch Transdermal Q24H   linaclotide  145 mcg Oral QAC breakfast   methocarbamol  500 mg Oral TID   mometasone-formoterol  2 puff Inhalation BID   pantoprazole  40 mg Oral BID AC   pravastatin  40 mg Oral QPM   QUEtiapine  50 mg Oral BID   zonisamide  100 mg Oral Daily   zonisamide  200 mg Oral QHS   Continuous Infusions:   Impression:   Melena d/t actively bleeding duodenal AVM s/p monopolar probe/clipping 6/7 H/O DVT on coumadin C/O upper abdo pain - hgb 7.5, stable - WBC 17.1 improving - BUN 28, improving No further bleeding, hgb stable, BUN improving. CT was ordered but has not been completed.    Plan:   -Continue daily CBC and transfuse as needed to maintain HGB > 7  -Continue with CT ab/pelvis - Dr. Elnoria Howard to resume care tomorrow   Bayley Leanna Sato  08/27/2022, 8:55 AM     Attending physician's note   I have taken history, reviewed the chart and examined the patient. I performed a substantive portion of this encounter, including complete performance of at least one of the key components, in conjunction with the APP. I agree with the Advanced Practitioner's note, impression and recommendations.   No further bleeding. Hb 7.5 stable.  Coumadin resumed Does complain of abdo pain-which appears to be more chronic.  CT Abdo/pelvis to be done today If CT is neg, can D/C home from GI standpoint with follow-up with Dr. Elnoria Howard. Dr. Elnoria Howard will be back tomorrow if patient is still here   Edman Circle, MD [facility] [phone]

## 2022-08-27 NOTE — Progress Notes (Signed)
   08/27/22 2157  BiPAP/CPAP/SIPAP  BiPAP/CPAP/SIPAP Pt Type Adult  BiPAP/CPAP/SIPAP Resmed  Mask Type Full face mask  Mask Size Large  Set Rate 0 breaths/min  Respiratory Rate 19 breaths/min  FiO2 (%) 21 %  Patient Home Equipment No  Auto Titrate Yes (auto 16/6)  CPAP/SIPAP surface wiped down Yes  BiPAP/CPAP /SiPAP Vitals  Pulse Rate 91  Resp 18  SpO2 98 %  Bilateral Breath Sounds Clear;Diminished  MEWS Score/Color  MEWS Score 0  MEWS Score Color [place] Si

## 2022-08-27 NOTE — Progress Notes (Signed)
[facility]                                                                              [name], is a 67 y.o. female, DOB - 16-Nov-1955, ZOX:[id number] Admit date - 08/23/2022    Outpatient Primary MD for the patient is Colleen Saint, MD  LOS - 3  days  Chief Complaint  Patient presents with   Fall   Dizziness       Brief summary   Patient is a 67 year old female with diabetes mellitus type 2, diabetes mellitus type 2, COPD, history of DVTs on Coumadin, history of clotting disorder, GERD, HTN, HLP, OSA, peripheral neuropathy presented to ED with dizziness.  Patient reported dizziness that started a day before admission and left knee almost gave out.  She has been having left knee pain and has been operated on previously by Dr. August Saucer.  On the day of admission, she felt more dizzy, diaphoretic, no chest pain abdominal pain or any headaches.  She continued to have constant knee pain, called Dr. Lyndle Herrlich office and had an appointment however could not make it to the appointment due to dizziness.   Patient denies taking NSAIDs for the knee pain.  Takes PPI for GERD  No nausea, vomiting, abdominal pain or diarrhea.  Patient takes Coumadin for history of DVTs, followed by Dr. Alcide Evener.  Per patient, she was having low INR hence she had recently increased her Coumadin.  In ED, temp 98.3, RR 27, heart rate 93, BP initially 133/82, then 111/97, O2 sats 97% on room air CMET unremarkable, CBC showed hemoglobin 9.9, was 11.6 on 06/22/2022 INR 7.0 FOBT positive   Assessment & Plan    Principal Problem:   Near syncope -Patient noted to be borderline BP on multiple antihypertensives outpatient (clonidine, Cardizem, Lasix, Aldactone), held on admission.   -Patient received 500 cc IVF bolus in ED -Troponins x 2 negative.  EKG did not show acute ST-T wave changes suggestive of ischemia.  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  GI bleed -FOBT positive with supratherapeutic INR.  Patient has history of GERD however denies taking any NSAIDs -Baseline hb 11- 12, Hb on admission 9.9->  6.1 on 6/7 s/p 2 units packed RBCs -Continue Protonix 40 mg twice daily -EGD 6/7 showed few gastric polyps, clips placed, single bleeding angiectasia in the duodenum, treated with monopolar probe, clips placed -CT abdomen pelvis pending today,  r/o RPH or any other acute abdominal pathology -  Hemoglobin 7.5, transfuse for hemoglobin less than 7  Acute on chronic left knee pain, fibromyalgia  -Patient has a prior history of left knee arthroplasty.  X-ray showed small suprapatellar joint effusion, no signs of periprosthetic fracture loosening or dislocation -CRP 2.4, ESR 81, uric acid 6.1 -Continue pain control, orthopedics following.  -CT left knee showed arthroplasty with intact hardware, no erosive changes, no CT evidence to suggest infectious or inflammatory process, joint aspiration can be considered. -Currently on Cymbalta 90 mg daily, Robaxin 500 mg TID, Lidoderm patch, tramadol not really helping -Increased gabapentin to 3 times daily, added oxycodone 5 mg q4hrs PRN Per patient, knee pain better with current regimen     Chronic diastolic CHF (congestive heart failure) (HCC) -2D echo 6/6 showed EF of 65-70%, G1  DD -Currently euvolemic      Mixed hyperlipidemia -Continue statin  Obesity Estimated body mass index is 45.51 kg/m as calculated from the following:   Height as of this encounter: 5\' 6"  (1.676 m).   Weight as of this encounter: 127.9 kg.  Code Status: Full code DVT Prophylaxis:  SCDs Start: 08/23/22 1948   Level of Care: Level of care: Telemetry Family Communication: Updated patient Disposition Plan:      Remains inpatient appropriate:     Procedures:  EGD  Consultants:   GI Orthopedics Cardiology  Antimicrobials: None   Medications  sodium chloride   Intravenous Once   cloNIDine  0.1 mg Oral BID   diltiazem  240 mg Oral QAC breakfast   DULoxetine  90 mg Oral QHS   fluticasone  2 spray Each Nare BID   furosemide  80 mg Oral Daily   gabapentin  300 mg Oral TID   insulin aspart  0-15 Units Subcutaneous TID WC   insulin aspart  0-5 Units Subcutaneous QHS   insulin glargine-yfgn  10 Units Subcutaneous QHS   lidocaine  1 patch Transdermal Q24H   linaclotide  145 mcg Oral QAC breakfast   methocarbamol  500 mg Oral TID   mometasone-formoterol  2 puff Inhalation BID   pantoprazole  40 mg Oral BID AC   pravastatin  40 mg Oral QPM   QUEtiapine  50 mg Oral BID   zonisamide  100 mg Oral Daily   zonisamide  200 mg Oral QHS      Subjective:   [name] was seen and examined today.  No acute nausea vomiting any obvious GI bleed.  States left knee pain improving with the medication changes yesterday.  CT abdomen pending today   Objective:   Vitals:   08/27/22 0743 08/27/22 0813 08/27/22 1019 08/27/22 1320  BP:  (!) 143/60 (!) 142/73 (!) 141/76  Pulse:  87  90  Resp:    18  Temp:    (!) 97.5 F (36.4 C)  TempSrc:    Oral  SpO2: 95%   96%  Weight:      Height:       No intake or output data in the 24 hours ending 08/27/22 1506    Wt Readings from Last 3 Encounters:  08/23/22 127.9 kg  07/29/22 127.9 kg  07/27/22 131.1 kg    Physical Exam General:  Alert and oriented x 3, NAD Cardiovascular: S1 S2 clear, RRR.  Respiratory: CTAB Gastrointestinal: Soft, nontender, nondistended, NBS Ext: no pedal edema bilaterally Neuro: no new deficits Psych: Normal affect   Data Reviewed:  I have personally reviewed following labs    CBC Lab Results  Component Value Date  WBC 17.1 (H) 08/27/2022   RBC 2.67 (L) 08/27/2022   HGB 7.5 (L) 08/27/2022   HCT 23.6 (L) 08/27/2022   MCV 88.4 08/27/2022   MCH 28.1 08/27/2022   PLT 266 08/27/2022   MCHC 31.8 08/27/2022   RDW 17.6 (H) 08/27/2022   LYMPHSABS 2.2 08/23/2022   MONOABS 0.7 08/23/2022   EOSABS 0.1 08/23/2022   BASOSABS 0.0 08/23/2022     Last metabolic panel Lab Results  Component Value Date   NA 138 08/27/2022   K 3.4 (L) 08/27/2022   CL 104 08/27/2022   CO2 24 08/27/2022   BUN 28 (H) 08/27/2022   CREATININE 0.74 08/27/2022   GLUCOSE 169 (H) 08/27/2022   GFRNONAA >60 08/27/2022   GFRAA 94 03/05/2020   CALCIUM 8.0 (L) 08/27/2022   PHOS 2.7 06/12/2014   PROT 7.7 08/23/2022   ALBUMIN 3.4 (L) 08/23/2022   BILITOT 0.1 (L) 08/23/2022   ALKPHOS 65 08/23/2022   AST 16 08/23/2022   ALT 19 08/23/2022   ANIONGAP 10 08/27/2022    CBG (last 3)  Recent Labs    08/26/22 2030 08/27/22 0758 08/27/22 1150  GLUCAP 201* 140* 138*      Coagulation Profile: Recent Labs  Lab 08/23/22 2117 08/24/22 0539 08/24/22 1227 08/25/22 0625 08/26/22 0500  INR 6.2* 4.9* 4.0* 2.4* 2.1*     Radiology Studies: I have personally reviewed the imaging studies  CT KNEE LEFT WO CONTRAST  Result Date: 08/25/2022 CLINICAL DATA:  Knee replacement, infection suspected. Postop total knee replacement. EXAM: CT OF THE LEFT KNEE WITHOUT CONTRAST TECHNIQUE: Multidetector CT imaging of the left knee was performed according to the standard protocol. Multiplanar CT image reconstructions were also generated. RADIATION DOSE REDUCTION: This exam was performed according to the departmental dose-optimization  program which includes automated exposure control, adjustment of the mA and/or kV according to patient size and/or use of iterative reconstruction technique. COMPARISON:  Radiographs dated August 23, 2022 FINDINGS: Bones/Joint/Cartilage Status post left knee arthroplasty with intact hardware. No perihardware loosening or erosive changes. Small joint effusion. Ligaments Suboptimally assessed by CT. Muscles and Tendons Muscles are normal in bulk. No evidence of intramuscular fluid collection or infectious or inflammatory process. Quadriceps and patellar tendon appear intact. Soft tissues Skin and subcutaneous soft tissues are within normal limits. No fluid collection or abscess. IMPRESSION: 1. Status post left knee arthroplasty with intact hardware. No perihardware loosening or erosive changes. No CT evidence to suggest infectious or inflammatory process. Joint aspiration could be considered if there is persistent clinical concern. 2. Small joint effusion. 3. Muscles and subcutaneous soft tissues are within normal limits. Electronically Signed   By: Larose Hires D.O.   On: 08/25/2022 20:43       Carilyn Woolston M.D. [facility] 08/27/2022, 3:06 PM  Available via Epic secure chat 7am-7pm After 7 pm, please refer to night coverage provider listed on amion.

## 2022-08-28 ENCOUNTER — Ambulatory Visit: Payer: 59

## 2022-08-28 ENCOUNTER — Ambulatory Visit: Payer: 59 | Admitting: Orthopedic Surgery

## 2022-08-28 DIAGNOSIS — R55 Syncope and collapse: Secondary | ICD-10-CM | POA: Diagnosis not present

## 2022-08-28 LAB — BASIC METABOLIC PANEL
Anion gap: 10 (ref 5–15)
BUN: 22 mg/dL (ref 8–23)
CO2: 26 mmol/L (ref 22–32)
Calcium: 8 mg/dL — ABNORMAL LOW (ref 8.9–10.3)
Chloride: 101 mmol/L (ref 98–111)
Creatinine, Ser: 0.85 mg/dL (ref 0.44–1.00)
GFR, Estimated: >60 mL/min (ref >60)
Glucose, Bld: 189 mg/dL — ABNORMAL HIGH (ref 70–99)
Potassium: 4 mmol/L (ref 3.5–5.1)
Sodium: 137 mmol/L (ref 135–145)

## 2022-08-28 LAB — CBC
HCT: 22.2 % — ABNORMAL LOW (ref 36.0–46.0)
Hemoglobin: 7 g/dL — ABNORMAL LOW (ref 12.0–15.0)
MCH: 27.8 pg (ref 26.0–34.0)
MCHC: 31.5 g/dL (ref 30.0–36.0)
MCV: 88.1 fL (ref 80.0–100.0)
Platelets: 297 10*3/uL (ref 150–400)
RBC: 2.52 MIL/uL — ABNORMAL LOW (ref 3.87–5.11)
RDW: 18.1 % — ABNORMAL HIGH (ref 11.5–15.5)
WBC: 15.4 10*3/uL — ABNORMAL HIGH (ref 4.0–10.5)
nRBC: 4.1 % — ABNORMAL HIGH (ref 0.0–0.2)

## 2022-08-28 LAB — GLUCOSE, CAPILLARY
Glucose-Capillary: 195 mg/dL — ABNORMAL HIGH (ref 70–99)
Glucose-Capillary: 127 mg/dL — ABNORMAL HIGH (ref 70–99)
Glucose-Capillary: 191 mg/dL — ABNORMAL HIGH (ref 70–99)
Glucose-Capillary: 155 mg/dL — ABNORMAL HIGH (ref 70–99)

## 2022-08-28 LAB — PROTIME-INR
INR: 1.3 — ABNORMAL HIGH (ref 0.8–1.2)
Prothrombin Time: 16 seconds — ABNORMAL HIGH (ref 11.4–15.2)

## 2022-08-28 LAB — SURGICAL PATHOLOGY

## 2022-08-28 MED ORDER — SORBITOL 70 % SOLN
960.0000 mL | TOPICAL_OIL | Freq: Once | ORAL | Status: AC
  Administered 2022-08-28: 960 mL via RECTAL
  Filled 2022-08-28: qty 240

## 2022-08-28 MED ORDER — WARFARIN SODIUM 5 MG PO TABS
7.5000 mg | ORAL_TABLET | Freq: Once | ORAL | Status: AC
  Administered 2022-08-28: 7.5 mg via ORAL
  Filled 2022-08-28: qty 1

## 2022-08-28 MED ORDER — WARFARIN - PHARMACIST DOSING INPATIENT: Freq: Every day | Status: DC

## 2022-08-28 NOTE — Progress Notes (Signed)
ANTICOAGULATION CONSULT NOTE - Initial Consult  Pharmacy Consult for Coumadin Indication: DVT  Allergies  Allergen Reactions   Lisinopril Anaphylaxis, Swelling and Other (See Comments)    THROAT SWELLING  Pt states her throat started to "close up" after being on it for "so long."  THROAT SWELLING, Pt states her throat started to "close up" after being on it for "so long."   Prednisone Anaphylaxis, Swelling and Other (See Comments)    Throat swelling and tongue irritation   Spironolactone Other (See Comments)    Caused stroke-like symptoms with the mouth   Corticosteroids Rash    States she developed a rash on her tongue after a steroid injection in her back    Patient Measurements: Height: 5\' 6"  (167.6 cm) Weight: 127.9 kg (281 lb 15.5 oz) IBW/kg (Calculated) : 59.3  Vital Signs: Temp: 98.4 F (36.9 C) (06/10 0542) Temp Source: Oral (06/10 0542) BP: 138/80 (06/10 0542) Pulse Rate: 96 (06/10 0542)  Labs: Recent Labs    08/26/22 0500 08/27/22 0550 08/27/22 1552 08/28/22 0625  HGB 7.8* 7.5*  --  7.0*  HCT 24.4* 23.6*  --  22.2*  PLT 260 266  --  297  LABPROT 23.6*  --  18.7* 16.0*  INR 2.1*  --  1.5* 1.3*  CREATININE 1.02* 0.74  --  0.85    Estimated Creatinine Clearance: 87.9 mL/min (by C-G formula based on SCr of 0.85 mg/dL).   Medical History: Past Medical History:  Diagnosis Date   Adrenal mass (HCC) 03/226   Benign   Arthritis    knees/multiple orthopedic conditons; lower back   Asthma    per pt   Clotting disorder (HCC)    +beta-2-glycoprotein IgA antibody   COPD (chronic obstructive pulmonary disease) (HCC)    inhalers dependent on environment   Depression    Diabetes mellitus    120s usually fasting -  dx more than 10 yrs ago   Dizziness, nonspecific    DVT (deep venous thrombosis) (HCC)    Recurrent   Fibromyalgia    GERD (gastroesophageal reflux disease)    Heart murmur    History of blood transfusion    History of cocaine abuse (HCC)     Remote history    Hyperlipidemia    Hypertension    takes meds daily   Hypothyroidism    Lupus Anticoagulant Positive    On home oxygen therapy    at night   Shortness of breath    exertion or lying flat   Sleep apnea    2l of oxygen at night (as of 12/6, she used to)    Assessment: AC/Heme: hx recurrent DVT (Lupus Anti-coag +) on warfarin PTA --> on hold for GIB? - PTA warfarin :10 mg daily except 7.5 mg MWF - 6/5: Vitamin K 5 mg p.o x1 (INR =7>6.2) - 6/6: Vitamin K 2.5 mg SQ x1 (INR 4.9>4)  - 6/7: 2 units PRBC  (Hgb 6.1, INR 2.4) - 6/7 upper GI endoscopy: Normal esophagus. A few gastric polyps. Resected and retrieved.  A single bleeding angioectasia in the duodenum (clipped)  ++ Dr. Haywood Pao procedure's note said ok to resume warfarin++ - 6/10: Hgb down to 7, INR 1.3  [place] of Therapy:  INR 2-3 Monitor platelets by anticoagulation protocol: Yes   Plan:  Coumadin 7.5mg  po x 1 today. Daily INR Will need home regimen significantly reduced due to admit INR 7   Jedrick Hutcherson S. Merilynn Finland, PharmD, BCPS Clinical Staff Pharmacist [email or web address] Merilynn Finland, [employer]  08/28/2022,7:53 AM

## 2022-08-28 NOTE — Progress Notes (Signed)
Physical Therapy Treatment Patient Details Name: [name] MRN: [id number] DOB: Mar 03, 1956 Today's Date: 08/28/2022   History of Present Illness Pt is an 67 y/o F admitted on 08/23/23 after presenting with c/o dizziness & L knee pain. Pt is being treated for near syncope. Pt s/p upper GI endoscopy 6/7 and received PRBCs 6/7. PMH: DM2, COPD, DVTs on coumadin, clotting disorder, GERD, HTN, HLP, OSA, peripheral neuropathy, heart murmr    PT Comments    Pt mobilizing slowly this date, reporting "tired" sensation (noted 7.0 hgb) but pt wanting to mobilize due to desire to return home and frustration with current functional limitations. Pt comes to sitting EOB with min G, heavy use of bedrail and significantly increased time and effort. Pt sitting EOB reporting L knee pain, denies dizziness. Once standing, pt reporting dizziness, limited standing tolerance due to L knee pain despite pain patch. Pt returns to sitting and vitals taken: BP 127/75, HR 85 and SpO2 98% on RA- RN notified. Pt needing min A to lift BLE back into bed and increased time to reposition to comfort. Pt reporting L knee pain, R lower abdominal pain and overall "tired" sensation this date all slowing progress with mobility.   Recommendations for follow up therapy are one component of a multi-disciplinary discharge planning process, led by the attending physician.  Recommendations may be updated based on patient status, additional functional criteria and insurance authorization.  Follow Up Recommendations       Assistance Recommended at Discharge Intermittent Supervision/Assistance  Patient can return home with the following A little help with walking and/or transfers;A little help with bathing/dressing/bathroom;Assistance with cooking/housework;Assist for transportation;Help with stairs or ramp for entrance   Equipment Recommendations  None recommended by PT    Recommendations for Other Services       Precautions /  Restrictions Precautions Precautions: Fall Precaution Comments: watch BP Restrictions Weight Bearing Restrictions: No     Mobility  Bed Mobility Overal bed mobility: Needs Assistance Bed Mobility: Supine to Sit, Sit to Supine     Supine to sit: Min guard Sit to supine: Min assist   General bed mobility comments: min G with increased time and effort using bedrail to upright trunk into tossint, min A to lift BLE back into bed    Transfers Overall transfer level: Needs assistance Equipment used: Rolling walker (2 wheels) Transfers: Sit to/from Stand Sit to Stand: Min guard           General transfer comment: verbal cues for hand placement, min G to power up to standing from EOB    Ambulation/Gait               General Gait Details: attempted to check BP after 3 minutes standing but required return to sitting after 2 minutes 30 seconds due to L knee pain   Stairs             Wheelchair Mobility    Modified Rankin (Stroke Patients Only)       Balance Overall balance assessment: Needs assistance Sitting-balance support: Feet supported Sitting balance-Leahy Scale: Good     Standing balance support: During functional activity, Reliant on assistive device for balance, Bilateral upper extremity supported Standing balance-Leahy Scale: Poor                              Cognition Arousal/Alertness: Awake/alert Behavior During Therapy: WFL for tasks assessed/performed Overall Cognitive Status: Within Functional Limits for tasks  assessed                                 General Comments: motivated to participate, eager to return home, frustrated with current functional limitations        Exercises      General Comments General comments (skin integrity, edema, etc.): BP 127/75, HR 85 and SpO2 98% on RA with constant dizziness complaints- notified RN      Pertinent Vitals/Pain Pain Assessment Pain Assessment: Faces Faces  Pain Scale: Hurts little more Pain Location: L knee and R lower abdomen Pain Descriptors / Indicators: Discomfort Pain Intervention(s): Limited activity within patient's tolerance, Monitored during session, Premedicated before session, Repositioned    Home Living                          Prior Function            PT Goals (current goals can now be found in the care plan section) Acute Rehab PT Goals Patient Stated [place]: get better, return home PT Germain Formulation: With patient Time For [place] Achievement: 09/07/22 Potential to Achieve Goals: Good Progress towards PT goals: Progressing toward goals (slowly due to pain)    Frequency    Min 3X/week      PT Plan Current plan remains appropriate    Co-evaluation              AM-PAC PT "6 Clicks" Mobility   Outcome Measure  Help needed turning from your back to your side while in a flat bed without using bedrails?: A Little Help needed moving from lying on your back to sitting on the side of a flat bed without using bedrails?: A Little Help needed moving to and from a bed to a chair (including a wheelchair)?: A Little Help needed standing up from a chair using your arms (e.g., wheelchair or bedside chair)?: A Little Help needed to walk in hospital room?: Total Help needed climbing 3-5 steps with a railing? : Total 6 Click Score: 14    End of Session Equipment Utilized During Treatment: Gait belt Activity Tolerance: Patient tolerated treatment well;Patient limited by fatigue Patient left: in bed;with call bell/phone within reach;with bed alarm set;with nursing/sitter in room Nurse Communication: Mobility status PT Visit Diagnosis: Pain;Muscle weakness (generalized) (M62.81);Other abnormalities of gait and mobility (R26.89);Difficulty in walking, not elsewhere classified (R26.2) Pain - Right/Left: Left Pain - part of body: Knee     Time: 1308-6578 PT Time Calculation (min) (ACUTE ONLY): 26 min  Charges:   $Therapeutic Activity: 23-37 mins                     Tori Coryn Mosso PT, DPT 08/28/22, 12:29 PM

## 2022-08-28 NOTE — Plan of Care (Signed)
  Problem: Education: [place]: Knowledge of General Education information will improve Description: Including pain rating scale, medication(s)/side effects and non-pharmacologic comfort measures Outcome: Progressing   Problem: Health Behavior/Discharge Planning: [place]: Ability to manage health-related needs will improve Outcome: Progressing   Problem: Clinical Measurements: [place]: Ability to maintain clinical measurements within normal limits will improve Outcome: Progressing [place]: Will remain free from infection Outcome: Progressing [place]: Diagnostic test results will improve Outcome: Progressing   

## 2022-08-28 NOTE — Progress Notes (Addendum)
[facility]                                                                              [name], is a 67 y.o. female, DOB - 1955-06-28, ZOX:[id number] Admit date - 08/23/2022    Outpatient Primary MD for the patient is [name], MD  LOS - 4  days  Chief Complaint  Patient presents with   Fall   Dizziness       Brief summary   Patient is a 67 year old female with diabetes mellitus type 2, diabetes mellitus type 2, COPD, history of DVTs on Coumadin, history of clotting disorder, GERD, HTN, HLP, OSA, peripheral neuropathy presented to ED with dizziness.  Patient reported dizziness that started a day before admission and left knee almost gave out.  She has been having left knee pain and has been operated on previously by Dr. August Saucer.  On the day of admission, she felt more dizzy, diaphoretic, no chest pain abdominal pain or any headaches.  She continued to have constant knee pain, called Dr. Lyndle Herrlich office and had an appointment however could not make it to the appointment due to dizziness.   Patient denies taking NSAIDs for the knee pain.  Takes PPI for GERD  No nausea, vomiting, abdominal pain or diarrhea.  Patient takes Coumadin for history of DVTs, followed by Dr. Alcide Evener.  Per patient, she was having low INR hence she had recently increased her Coumadin.  In ED, temp 98.3, RR 27, heart rate 93, BP initially 133/82, then 111/97, O2 sats 97% on room air CMET unremarkable, CBC showed hemoglobin 9.9, was 11.6 on 06/22/2022 INR 7.0 FOBT positive   Assessment & Plan    Principal Problem:   Near syncope -Patient noted to be borderline BP on multiple antihypertensives outpatient (clonidine, Cardizem, Lasix, Aldactone), held on admission.   -Patient received 500 cc IVF bolus in ED -Troponins x 2 negative.  EKG did not show acute ST-T wave changes suggestive of ischemia.  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  GI bleed -FOBT positive with supratherapeutic INR.  Patient has history of GERD however denies taking any NSAIDs -Baseline hb 11- 12, Hb on admission 9.9->  6.1 on 6/7 s/p 2 units packed RBCs -Continue Protonix 40 mg twice daily -EGD 6/7 showed few gastric polyps, clips placed, single bleeding angiectasia in the duodenum, treated with monopolar probe, clips placed --Hemoglobin 7.0 unclear why trending down still, will continue to  hold off on Coumadin -CT abdomen pelvis did not show any retroperitoneal hemorrhage or hematoma.  -Patient continues to complain of abdominal pain, today in right LQ, will await GI recommendations  Acute on chronic left knee pain, fibromyalgia  -Patient has a prior history of left knee arthroplasty.  X-ray showed small suprapatellar joint effusion, no signs of periprosthetic fracture loosening or dislocation -CRP 2.4, ESR 81, uric acid 6.1 -CT left knee showed arthroplasty with intact hardware, no erosive changes, no CT evidence to suggest infectious or inflammatory process, joint aspiration can be considered. -per patient, her pain is somewhat better with the current regimen  -Orthopedics following      Chronic diastolic CHF (congestive heart failure) (HCC) -2D echo 6/6 showed EF of 65-70%, G1 DD -Currently euvolemic      Mixed hyperlipidemia -Continue  statin  ?  Left lower lobe and lingular pneumonia on CT scan -Currently no fevers, leukocytosis or respiratory symptoms.  Hold off on antibiotics unless spikes fevers.  Obesity Estimated body mass index is 45.51 kg/m as calculated from the following:   Height as of this encounter: 5\' 6"  (1.676 m).   Weight as of this encounter: 127.9 kg.  Code Status: Full code DVT Prophylaxis:  SCDs Start: 08/23/22 1948 warfarin (COUMADIN) tablet 7.5 mg   Level of Care: Level of care: Telemetry Family Communication: Updated patient's daughter on the phone and brother at the bedside Disposition Plan:      Remains inpatient appropriate:     Procedures:  EGD  Consultants:   GI Orthopedics Cardiology  Antimicrobials: None   Medications  sodium chloride   Intravenous Once   cloNIDine  0.1 mg Oral BID   diltiazem  240 mg Oral QAC breakfast   DULoxetine  90 mg Oral QHS   fluticasone  2 spray Each Nare BID   furosemide  80 mg Oral Daily   gabapentin  300 mg Oral TID   insulin aspart  0-15 Units Subcutaneous TID WC   insulin aspart  0-5 Units Subcutaneous QHS   insulin glargine-yfgn  10 Units Subcutaneous QHS   lidocaine  1 patch Transdermal Q24H   linaclotide  145 mcg Oral QAC breakfast   methocarbamol  500 mg Oral TID   mometasone-formoterol  2 puff Inhalation BID   pantoprazole  40 mg Oral BID AC   pravastatin  40 mg Oral QPM   QUEtiapine  50 mg Oral BID   warfarin  7.5 mg Oral ONCE-1600   zonisamide  100 mg Oral Daily   zonisamide  200 mg Oral QHS      Subjective:   [name] was seen and examined today.  Continues to feel miserable, no active bleeding, no hematemesis, hematochezia or melena.  However hemoglobin still continues to trend down.  Complaining of abdominal pain, in the RLQ, no nausea vomiting or fevers. No shortness of breath, cough or any phlegm.  Still has left knee pain although somewhat better from admission   Objective:   Vitals:   08/27/22 2157 08/28/22  0542 08/28/22 0745 08/28/22 1431  BP:  138/80  139/63  Pulse: 91 96  83  Resp: 18 17  17   Temp:  98.4 F (36.9 C)  98 F (36.7 C)  TempSrc:  Oral  Oral  SpO2: 98% 93% 94% 96%  Weight:      Height:        Intake/Output Summary (Last 24 hours) at 08/28/2022 1524 Last data filed at 08/28/2022 1122 Gross per 24 hour  Intake 100 ml  Output --  Net 100 ml      Wt Readings from Last 3 Encounters:  08/23/22 127.9 kg  07/29/22 127.9 kg  07/27/22 131.1 kg   Physical Exam General: Alert and oriented x 3, NAD Cardiovascular: S1 S2 clear, RRR.  Respiratory: CTAB, no wheezing, rales or rhonchi Gastrointestinal: Soft, TTP in RLQ, nondistended, NBS Ext: no pedal edema bilaterally Neuro: no new deficits Skin: No rashes Psych: uncomfortable   Data Reviewed:  I have personally reviewed following labs    CBC Lab Results  Component Value Date   WBC 15.4 (H) 08/28/2022   RBC 2.52 (L) 08/28/2022   HGB 7.0 (L) 08/28/2022   HCT 22.2 (L) 08/28/2022   MCV 88.1 08/28/2022   MCH 27.8 08/28/2022   PLT 297 08/28/2022   MCHC 31.5 08/28/2022   RDW 18.1 (H) 08/28/2022   LYMPHSABS 2.2 08/23/2022   MONOABS 0.7 08/23/2022   EOSABS 0.1 08/23/2022   BASOSABS 0.0 08/23/2022     Last metabolic panel Lab Results  Component Value Date   NA 137 08/28/2022   K 4.0 08/28/2022   CL 101 08/28/2022   CO2 26 08/28/2022   BUN 22 08/28/2022   CREATININE 0.85 08/28/2022   GLUCOSE 189 (H) 08/28/2022   GFRNONAA >60 08/28/2022   GFRAA 94 03/05/2020   CALCIUM 8.0 (L) 08/28/2022   PHOS 2.7 06/12/2014   PROT 7.7 08/23/2022   ALBUMIN 3.4 (L) 08/23/2022   BILITOT 0.1 (L) 08/23/2022   ALKPHOS 65 08/23/2022   AST 16 08/23/2022   ALT 19 08/23/2022   ANIONGAP 10 08/28/2022    CBG (last 3)  Recent Labs    08/27/22 2059 08/28/22 0822 08/28/22 1122  GLUCAP 151* 195* 127*      Coagulation Profile: Recent Labs  Lab 08/24/22 1227 08/25/22 0625 08/26/22 0500 08/27/22 1552 08/28/22 0625   INR 4.0* 2.4* 2.1* 1.5* 1.3*     Radiology Studies: I have personally reviewed the imaging studies  CT ABDOMEN PELVIS WO CONTRAST  Result Date: 08/27/2022 CLINICAL DATA:  Abdominal pain, acute, nonlocalized abd pain, unclear etiology, rule out RPH, on Coumadin with supratherapeutic INR on admission EXAM: CT ABDOMEN AND PELVIS WITHOUT CONTRAST TECHNIQUE: Multidetector CT imaging of the abdomen and pelvis was performed following the standard protocol without IV contrast. RADIATION DOSE REDUCTION: This exam was performed according to the departmental dose-optimization program which includes automated exposure control, adjustment of the mA and/or kV according to patient size and/or use of iterative reconstruction technique. COMPARISON:  CT 05/29/2018 FINDINGS: Lower chest: Nodular and ground-glass opacities within the left lower lobe and lingula. Heart size upper normal. Hepatobiliary: Diffuse hepatic steatosis. Scattered subcentimeter hepatic hypodensities are stable from prior exam. The liver is mildly enlarged spanning 18 cm cranial caudal. Clips in the gallbladder fossa postcholecystectomy. No biliary dilatation. Pancreas: No ductal dilatation or inflammation. Spleen: Normal in size without focal abnormality. Adrenals/Urinary Tract: 3.1 cm right adrenal nodule with Hounsfield units of 23 stable from 2020 exam consistent with benign adenoma. No left adrenal nodule. No hydronephrosis. No intrarenal calculi. There are multiple left retroperitoneal phleboliths that are adjacent to the ureter. Unremarkable urinary bladder. Stomach/Bowel: Small hiatal hernia. Scattered high-density within the stomach and duodenum, some of which appears linear. These may be related to clips or ingested material. Enteric contrast is seen within distal small bowel and proximal colon, there is no obstruction. No bowel inflammation. Normal appendix is seen. Moderate volume of stool in the right colon. Small volume of  colonic stool  distally. Vascular/Lymphatic: Normal caliber abdominal aorta. Multiple retroperitoneal phleboliths. There is no evidence of retroperitoneal stranding or hemorrhage. Reproductive: Status post hysterectomy. No adnexal masses. Other: No free air or free fluid. No retroperitoneal or intra-abdominopelvic stranding. There is mild edema in the subcutaneous flank tissues. Small fat containing umbilical hernia. Musculoskeletal: Anterior fusion L4-L5. Lumbar facet hypertrophy. There are no acute or suspicious osseous abnormalities. IMPRESSION: 1. No acute abnormality in the abdomen/pelvis. No evidence of retroperitoneal stranding or hemorrhage. 2. Nodular and ground-glass opacities within the left lower lobe and lingula, suspicious for pneumonia. 3. Hepatic steatosis and mild hepatomegaly. 4. Stable right adrenal adenoma, needing no further imaging follow-up. Electronically Signed   By: Narda Rutherford M.D.   On: 08/27/2022 19:05       Ruperto Kiernan M.D. [facility] 08/28/2022, 3:24 PM  Available via Epic secure chat 7am-7pm After 7 pm, please refer to night coverage provider listed on amion.

## 2022-08-28 NOTE — Care Management Important Message (Signed)
Important Message  Patient Details IM Letter given. Name: [name] MRN: [id number] Date of Birth: 1955/06/23   Medicare Important Message Given:  Yes     Caren Macadam 08/28/2022, 9:54 AM

## 2022-08-28 NOTE — Progress Notes (Signed)
Patient reevaluated.  She continues to have left knee pain but states it is about 10% better compared with last week.  She denies any fevers or chills.  She clarified her story that she told me last week as far as she did not have any knee pain until she got out of her car and collapsed and this is when her knee pain all began.  Localizes pain mostly to the anterior medial aspect of the knee.  She has no associated radicular pain or groin pain.  Does not really bother her as much in bed but really bothers her when she tries to stand and walk.  On exam, she has trace effusion but nothing that feels significant.  Incision still looks well-healed over the anterior aspect of the left knee.  Still having difficulty performing straight leg raise as she states that this is very painful.  No calf tenderness.  No pain with hip range of motion.  She has decreased pain with knee lifting her leg up and providing passive extension and flexion of the left knee.  No tenderness over the lateral aspect of the knee diffusely.  She does have tenderness primarily over the tibial attachment of the MCL and reproduction of pain with stressing the MCL primarily at about 20 degrees of knee flexion.  No asymmetric warmth compared with the left and right knees.  Impression is left knee pain that began after she fell.  CT scan negative for any fracture or any disruption of the prosthesis from her knee replacement.  Most of her symptoms seem to correspond with likely MCL sprain that is reproduced on exam.  With all of this starting from that traumatic event, prosthetic joint infection is very unlikely and joint aspiration not indicated.  I recommended patient follow-up with Dr. August Saucer in clinic after she discharges from the hospital and she agreed with plan.

## 2022-08-28 NOTE — Progress Notes (Signed)
Subjective: Patient continues to complain of abdominal pain in the right lower quadrant. As per her nurse she had a large bowel movement after she received an enema and the stools were charcoal gray in color. Patient has been on iron prior to admission and I suspect that that is why her stools were black. There are no complaints of hematochezia. She had a CT scan of the abdomen pelvis done yesterday showed small hiatal hernia with moderate volume of stool in the right colon and small volume of stool distally no definite source of her abdominal pain was identified small fat-containing umbilical hernia was seen; hepatic steatosis along with mild hepatomegaly and a stable right adrenal adenoma was described.  Her hemoglobin is dropped from 7.8-7 0.5 to 7.0 g today with no evidence of overt bleeding.  The Coumadin and has still not been restarted but as her hemoglobin has been trending downwards. She had an EGD done on 08/25/2022 when she was noted to have a few gastric polyps and a small single bleeding angiectasia of the duodenum was cauterized.  Objective: Vital signs in last 24 hours: Temp:  [97.5 F (36.4 C)-98.4 F (36.9 C)] 98.4 F (36.9 C) (06/10 0542) Pulse Rate:  [87-96] 96 (06/10 0542) Resp:  [17-18] 17 (06/10 0542) BP: (138-143)/(60-85) 138/80 (06/10 0542) SpO2:  [93 %-98 %] 93 % (06/10 0542) FiO2 (%):  [21 %] 21 % (06/09 2157) Last BM Date : 08/24/22  Intake/Output from previous day: No intake/output data recorded. Intake/Output this shift: No intake/output data recorded.  General appearance: alert, cooperative, fatigued, and morbidly obese Resp: clear to auscultation bilaterally Cardio: regular rate and rhythm, S1, S2 normal, no murmur, click, rub or gallop GI: soft, non-tender; bowel sounds normal; no masses,  no organomegaly  Lab Results: Recent Labs    08/26/22 0500 08/27/22 0550  WBC 27.3* 17.1*  HGB 7.8* 7.5*  HCT 24.4* 23.6*  PLT 260 266   BMET Recent Labs     08/26/22 0500 08/27/22 0550  NA 137 138  K 3.8 3.4*  CL 103 104  CO2 23 24  GLUCOSE 195* 169*  BUN 38* 28*  CREATININE 1.02* 0.74  CALCIUM 8.1* 8.0*   LFT No results for input(s): "PROT", "ALBUMIN", "AST", "ALT", "ALKPHOS", "BILITOT", "BILIDIR", "IBILI" in the last 72 hours. PT/INR Recent Labs    08/26/22 0500 08/27/22 1552  LABPROT 23.6* 18.7*  INR 2.1* 1.5*   Studies/Results: CT ABDOMEN PELVIS WO CONTRAST  Result Date: 08/27/2022 CLINICAL DATA:  Abdominal pain, acute, nonlocalized abd pain, unclear etiology, rule out RPH, on Coumadin with supratherapeutic INR on admission EXAM: CT ABDOMEN AND PELVIS WITHOUT CONTRAST TECHNIQUE: Multidetector CT imaging of the abdomen and pelvis was performed following the standard protocol without IV contrast. RADIATION DOSE REDUCTION: This exam was performed according to the departmental dose-optimization program which includes automated exposure control, adjustment of the mA and/or kV according to patient size and/or use of iterative reconstruction technique. COMPARISON:  CT 05/29/2018 FINDINGS: Lower chest: Nodular and ground-glass opacities within the left lower lobe and lingula. Heart size upper normal. Hepatobiliary: Diffuse hepatic steatosis. Scattered subcentimeter hepatic hypodensities are stable from prior exam. The liver is mildly enlarged spanning 18 cm cranial caudal. Clips in the gallbladder fossa postcholecystectomy. No biliary dilatation. Pancreas: No ductal dilatation or inflammation. Spleen: Normal in size without focal abnormality. Adrenals/Urinary Tract: 3.1 cm right adrenal nodule with Hounsfield units of 23 stable from 2020 exam consistent with benign adenoma. No left adrenal nodule. No hydronephrosis. No intrarenal  calculi. There are multiple left retroperitoneal phleboliths that are adjacent to the ureter. Unremarkable urinary bladder. Stomach/Bowel: Small hiatal hernia. Scattered high-density within the stomach and duodenum, some of  which appears linear. These may be related to clips or ingested material. Enteric contrast is seen within distal small bowel and proximal colon, there is no obstruction. No bowel inflammation. Normal appendix is seen. Moderate volume of stool in the right colon. Small volume of colonic stool distally. Vascular/Lymphatic: Normal caliber abdominal aorta. Multiple retroperitoneal phleboliths. There is no evidence of retroperitoneal stranding or hemorrhage. Reproductive: Status post hysterectomy. No adnexal masses. Other: No free air or free fluid. No retroperitoneal or intra-abdominopelvic stranding. There is mild edema in the subcutaneous flank tissues. Small fat containing umbilical hernia. Musculoskeletal: Anterior fusion L4-L5. Lumbar facet hypertrophy. There are no acute or suspicious osseous abnormalities. IMPRESSION: 1. No acute abnormality in the abdomen/pelvis. No evidence of retroperitoneal stranding or hemorrhage. 2. Nodular and ground-glass opacities within the left lower lobe and lingula, suspicious for pneumonia. 3. Hepatic steatosis and mild hepatomegaly. 4. Stable right adrenal adenoma, needing no further imaging follow-up. Electronically Signed   By: Narda Rutherford M.D.   On: 08/27/2022 19:05    Medications: I have reviewed the patient's current medications. Prior to Admission:  Facility-Administered Medications Prior to Admission  Medication Dose Route Frequency Provider Last Rate Last Admin   sodium chloride flush (NS) 0.9 % injection 3 mL  3 mL Intravenous Q12H Chandrasekhar, [name] A, MD       Medications Prior to Admission  Medication Sig Dispense Refill Last Dose   albuterol (PROVENTIL) (2.5 MG/3ML) 0.083% nebulizer solution USE 1 VIAL IN NEBULIZER EVERY 6 HOURS - and as needed (Patient taking differently: Take 2.5 mg by nebulization every 6 (six) hours as needed for wheezing or shortness of breath.) 120 mL 11 unk   albuterol (VENTOLIN HFA) 108 (90 Base) MCG/ACT inhaler INHALE TWO  PUFFS BY MOUTH INTO LUNGS every FOUR hours AS NEEDED FOR SHORTNESS OF BREATH (Patient taking differently: Inhale 2 puffs into the lungs every 4 (four) hours as needed for shortness of breath.) 8 g 11 08/23/2022   cloNIDine (CATAPRES) 0.1 MG tablet TAKE ONE TABLET BY MOUTH BEFORE BREAKFAST and TAKE ONE TABLET BY MOUTH EVERYDAY AT BEDTIME (Patient taking differently: Take 0.1 mg by mouth See admin instructions. Take 0.1 mg by mouth before breakfast and at bedtime) 60 tablet 11 08/22/2022 at pm   diclofenac sodium (VOLTAREN) 1 % GEL Apply 2 g topically daily as needed (fibromyalgia pain- affected sites).   unk   diltiazem (CARDIZEM CD) 240 MG 24 hr capsule TAKE ONE CAPSULE BY MOUTH BEFORE BREAKFAST (Patient taking differently: Take 240 mg by mouth daily before breakfast.) 90 capsule 3 08/22/2022 at am   DULoxetine (CYMBALTA) 30 MG capsule Take 90 mg by mouth at bedtime.   08/22/2022 at pm   FEROSUL 325 (65 Fe) MG tablet TAKE ONE TABLET BY MOUTH ONCE DAILY (Patient taking differently: Take 325 mg by mouth daily with breakfast.) 90 tablet 0 08/22/2022   fexofenadine (ALLEGRA) 180 MG tablet TAKE ONE TABLET BY MOUTH ONCE DAILY (Patient taking differently: Take 180 mg by mouth daily.) 30 tablet 3 08/22/2022   fluticasone (FLONASE) 50 MCG/ACT nasal spray Place 2 sprays into both nostrils 2 (two) times daily. (Patient taking differently: Place 2 sprays into both nostrils 2 (two) times daily as needed for allergies or rhinitis.) 18.2 mL 11 unk   furosemide (LASIX) 80 MG tablet TAKE ONE TABLET BY MOUTH  TWICE DAILY (Patient taking differently: Take 80 mg by mouth 2 (two) times daily.) 180 tablet 3 08/22/2022   gabapentin (NEURONTIN) 300 MG capsule TAKE ONE CAPSULE BY MOUTH TWICE DAILY (Patient taking differently: Take 300 mg by mouth See admin instructions. Take 300 mg by mouth at 6 PM and at bedtime) 180 capsule 2 08/22/2022 at pm   INVOKANA 100 MG TABS tablet TAKE ONE TABLET BY MOUTH BEFORE BREAKFAST (Patient taking differently:  Take 100 mg by mouth daily before breakfast.) 90 tablet 3 08/23/2022 at am   lidocaine (LIDODERM) 5 % Place 1 patch onto the skin daily. Remove & Discard patch within 12 hours or as directed by MD (Patient taking differently: Place 1 patch onto the skin daily as needed (to painful site- Remove & Discard patch within 12 hours or as directed by MD).) 30 patch 0 08/22/2022   LINZESS 145 MCG CAPS capsule TAKE ONE CAPSULE BY MOUTH every other DAY (Patient taking differently: Take 145 mcg by mouth every other day.) 60 capsule 1 08/20/2022   metFORMIN (GLUCOPHAGE) 500 MG tablet TAKE ONE TABLET BY MOUTH EVERYDAY AT BEDTIME (Patient taking differently: Take 500 mg by mouth at bedtime.) 90 tablet 3 08/22/2022 at pm   methocarbamol (ROBAXIN) 500 MG tablet Take 1 tablet (500 mg total) by mouth 2 (two) times daily. (Patient taking differently: Take 500 mg by mouth 2 (two) times daily as needed for muscle spasms.) 20 tablet 0    omeprazole (PRILOSEC) 20 MG capsule TAKE ONE CAPSULE BY MOUTH BEFORE BREAKFAST and TAKE ONE CAPSULE BY MOUTH EVERY EVENING (Patient taking differently: Take 20 mg by mouth See admin instructions. Take 20 mg by mouth before breakfast and in the evening) 180 capsule 1 08/22/2022   potassium chloride (MICRO-K) 10 MEQ CR capsule TAKE TWO CAPSULES BY MOUTH EVERYDAY AT BEDTIME (Patient taking differently: Take 20 mEq by mouth at bedtime. TAKE TWO CAPSULES BY MOUTH EVERYDAY AT BEDTIME) 60 capsule 11 08/22/2022 at pm   pravastatin (PRAVACHOL) 40 MG tablet TAKE ONE TABLET BY MOUTH EVERY EVENING (Patient taking differently: Take 40 mg by mouth every evening.) 30 tablet 11 08/22/2022 at pm   QUEtiapine (SEROQUEL) 50 MG tablet Take 50 mg by mouth in the morning and at bedtime.   08/22/2022 at pm   Semaglutide, 1 MG/DOSE, 4 MG/3ML SOPN Inject 1 mg as directed once a week. (Patient taking differently: Inject 1 mg into the skin every Saturday.) 3 mL 6 08/19/2022   SYMBICORT 160-4.5 MCG/ACT inhaler INHALE TWO PUFFS BY MOUTH INTO  LUNGS twice daily (Patient taking differently: Inhale 2 puffs into the lungs 2 (two) times daily.) 10.2 g 11 Past Month   traMADol (ULTRAM) 50 MG tablet Take 1 tablet (50 mg total) by mouth every 12 (twelve) hours as needed. (Patient taking differently: Take 50 mg by mouth every 12 (twelve) hours as needed (for pain).) 30 tablet 0 unk   Vitamin D, Ergocalciferol, (DRISDOL) 1.25 MG (50000 UNIT) CAPS capsule Take 1 capsule (50,000 Units total) by mouth every 7 (seven) days. 12 capsule 0 Past Week   warfarin (COUMADIN) 10 MG tablet TAKE ONE TABLET BY MOUTH daily EXCEPT ON Monday, Wednesday, AND Friday OR AS DIRECTED (Patient taking differently: Take 10 mg by mouth See admin instructions. Take 10 mg by mouth at bedtime on Sun/Tues/Thurs/Sat) 30 tablet 1 08/22/2022 at 2030   warfarin (COUMADIN) 7.5 MG tablet TAKE ONE TABLET BY MOUTH ON Monday, Wednesdays, AND Fridays (Patient taking differently: Take 7.5 mg by mouth See  admin instructions. Take 7.5 mg by mouth at bedtime on Mon/Wed/Fri) 30 tablet 2 08/21/2022 at 2000   zonisamide (ZONEGRAN) 100 MG capsule Take 100-200 mg by mouth See admin instructions. Take 100 mg by mouth in the morning and 200 mg in the evening   08/22/2022   ACCU-CHEK GUIDE test strip USE TO check blood glucose UP TO four times daily AS DIRECTED 100 strip 11    Accu-Chek Softclix Lancets lancets USE TO check blood glucose UP TO four times daily AS DIRECTED 400 each 3    blood glucose meter kit and supplies KIT Dispense based on patient and insurance preference. Use up to four times daily as directed. (FOR ICD-9 250.00, 250.01). 1 each 0    Incontinence Supply Disposable (PROCARE BARIATRIC BRIEFS) MISC Use as needed. 96 each 11    spironolactone (ALDACTONE) 25 MG tablet TAKE 1/2 TABLET BY MOUTH ONCE DAILY (Patient not taking: Reported on 08/23/2022) 45 tablet 2 Not Taking   Scheduled:  sodium chloride   Intravenous Once   cloNIDine  0.1 mg Oral BID   diltiazem  240 mg Oral QAC breakfast    DULoxetine  90 mg Oral QHS   fluticasone  2 spray Each Nare BID   furosemide  80 mg Oral Daily   gabapentin  300 mg Oral TID   insulin aspart  0-15 Units Subcutaneous TID WC   insulin aspart  0-5 Units Subcutaneous QHS   insulin glargine-yfgn  10 Units Subcutaneous QHS   lidocaine  1 patch Transdermal Q24H   linaclotide  145 mcg Oral QAC breakfast   methocarbamol  500 mg Oral TID   mometasone-formoterol  2 puff Inhalation BID   pantoprazole  40 mg Oral BID AC   pravastatin  40 mg Oral QPM   QUEtiapine  50 mg Oral BID   zonisamide  100 mg Oral Daily   zonisamide  200 mg Oral QHS   Continuous:  Assessment/Plan: 1) Anemia with a history of melena on admission-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  I discussed the case with Dr. Isidoro Donning and plans are to monitor her closely watch her hemoglobin tomorrow before further recommendations are made.  This clearly hesitation to start the Coumadin because of her hemoglobin seems to be trending downwards.Marland Kitchen 2) Chronic constipation-she will need aggressive bowel regimen regimen with MiraLAX and Colace on a daily basis. 3) Morbid obesity, hepatic steatosis on CT scan mild hepatomegaly. 4) Chronic right lower quadrant pain of unclear etiology. 5) HTN/mixed hyperlipidemia/diastolic CHF. 6) Acute on chronic left knee pain/fibromyalgia. 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  Charna Elizabeth 08/28/2022, 6:47 AM

## 2022-08-29 ENCOUNTER — Telehealth: Payer: Self-pay | Admitting: Oncology

## 2022-08-29 DIAGNOSIS — I10 Essential (primary) hypertension: Secondary | ICD-10-CM | POA: Diagnosis not present

## 2022-08-29 DIAGNOSIS — Z7901 Long term (current) use of anticoagulants: Secondary | ICD-10-CM | POA: Diagnosis not present

## 2022-08-29 DIAGNOSIS — R55 Syncope and collapse: Secondary | ICD-10-CM | POA: Diagnosis not present

## 2022-08-29 DIAGNOSIS — K922 Gastrointestinal hemorrhage, unspecified: Secondary | ICD-10-CM | POA: Diagnosis not present

## 2022-08-29 LAB — BASIC METABOLIC PANEL
Anion gap: 9 (ref 5–15)
BUN: 19 mg/dL (ref 8–23)
CO2: 28 mmol/L (ref 22–32)
Calcium: 8.1 mg/dL — ABNORMAL LOW (ref 8.9–10.3)
Chloride: 101 mmol/L (ref 98–111)
Creatinine, Ser: 0.82 mg/dL (ref 0.44–1.00)
GFR, Estimated: >60 mL/min (ref >60)
Glucose, Bld: 158 mg/dL — ABNORMAL HIGH (ref 70–99)
Potassium: 3.7 mmol/L (ref 3.5–5.1)
Sodium: 138 mmol/L (ref 135–145)

## 2022-08-29 LAB — CULTURE, BLOOD (ROUTINE X 2)
Culture: NO GROWTH
Culture: NO GROWTH

## 2022-08-29 LAB — CBC
HCT: 23.5 % — ABNORMAL LOW (ref 36.0–46.0)
Hemoglobin: 7.2 g/dL — ABNORMAL LOW (ref 12.0–15.0)
MCH: 27.3 pg (ref 26.0–34.0)
MCHC: 30.6 g/dL (ref 30.0–36.0)
MCV: 89 fL (ref 80.0–100.0)
Platelets: 328 10*3/uL (ref 150–400)
RBC: 2.64 MIL/uL — ABNORMAL LOW (ref 3.87–5.11)
RDW: 18.1 % — ABNORMAL HIGH (ref 11.5–15.5)
WBC: 14.1 10*3/uL — ABNORMAL HIGH (ref 4.0–10.5)
nRBC: 4.3 % — ABNORMAL HIGH (ref 0.0–0.2)

## 2022-08-29 LAB — GLUCOSE, CAPILLARY
Glucose-Capillary: 139 mg/dL — ABNORMAL HIGH (ref 70–99)
Glucose-Capillary: 134 mg/dL — ABNORMAL HIGH (ref 70–99)
Glucose-Capillary: 150 mg/dL — ABNORMAL HIGH (ref 70–99)
Glucose-Capillary: 157 mg/dL — ABNORMAL HIGH (ref 70–99)

## 2022-08-29 LAB — PROTIME-INR
INR: 1.2 (ref 0.8–1.2)
Prothrombin Time: 15 seconds (ref 11.4–15.2)

## 2022-08-29 MED ORDER — WARFARIN SODIUM 5 MG PO TABS
5.0000 mg | ORAL_TABLET | Freq: Once | ORAL | Status: AC
  Administered 2022-08-29: 5 mg via ORAL
  Filled 2022-08-29: qty 1

## 2022-08-29 MED ORDER — WARFARIN - PHARMACIST DOSING INPATIENT: Freq: Every day | Status: DC

## 2022-08-29 NOTE — Progress Notes (Signed)
Subjective: No acute events.  Feeling well.  Objective: Vital signs in last 24 hours: Temp:  [97.7 F (36.5 C)-98.3 F (36.8 C)] 98 F (36.7 C) (06/11 1150) Pulse Rate:  [83-86] 86 (06/11 1150) Resp:  [17-18] 18 (06/11 1150) BP: (139-153)/(62-84) 147/84 (06/11 1150) SpO2:  [94 %-99 %] 98 % (06/11 1150) Last BM Date : 08/28/22 (SMOG enema)  Intake/Output from previous day: 06/10 0701 - 06/11 0700 In: 645 [P.O.:645] Out: -  Intake/Output this shift: No intake/output data recorded.  General appearance: alert and no distress GI: soft, non-tender; bowel sounds normal; no masses,  no organomegaly  Lab Results: Recent Labs    08/27/22 0550 08/28/22 0625 08/29/22 0552  WBC 17.1* 15.4* 14.1*  HGB 7.5* 7.0* 7.2*  HCT 23.6* 22.2* 23.5*  PLT 266 297 328   BMET Recent Labs    08/27/22 0550 08/28/22 0625 08/29/22 0552  NA 138 137 138  K 3.4* 4.0 3.7  CL 104 101 101  CO2 24 26 28   GLUCOSE 169* 189* 158*  BUN 28* 22 19  CREATININE 0.74 0.85 0.82  CALCIUM 8.0* 8.0* 8.1*   LFT No results for input(s): "PROT", "ALBUMIN", "AST", "ALT", "ALKPHOS", "BILITOT", "BILIDIR", "IBILI" in the last 72 hours. PT/INR Recent Labs    08/28/22 0625 08/29/22 0552  LABPROT 16.0* 15.0  INR 1.3* 1.2   Hepatitis Panel No results for input(s): "HEPBSAG", "HCVAB", "HEPAIGM", "HEPBIGM" in the last 72 hours. C-Diff No results for input(s): "CDIFFTOX" in the last 72 hours. Fecal Lactopherrin No results for input(s): "FECLLACTOFRN" in the last 72 hours.  Studies/Results: CT ABDOMEN PELVIS WO CONTRAST  Result Date: 08/27/2022 CLINICAL DATA:  Abdominal pain, acute, nonlocalized abd pain, unclear etiology, rule out RPH, on Coumadin with supratherapeutic INR on admission EXAM: CT ABDOMEN AND PELVIS WITHOUT CONTRAST TECHNIQUE: Multidetector CT imaging of the abdomen and pelvis was performed following the standard protocol without IV contrast. RADIATION DOSE REDUCTION: This exam was performed  according to the departmental dose-optimization program which includes automated exposure control, adjustment of the mA and/or kV according to patient size and/or use of iterative reconstruction technique. COMPARISON:  CT 05/29/2018 FINDINGS: Lower chest: Nodular and ground-glass opacities within the left lower lobe and lingula. Heart size upper normal. Hepatobiliary: Diffuse hepatic steatosis. Scattered subcentimeter hepatic hypodensities are stable from prior exam. The liver is mildly enlarged spanning 18 cm cranial caudal. Clips in the gallbladder fossa postcholecystectomy. No biliary dilatation. Pancreas: No ductal dilatation or inflammation. Spleen: Normal in size without focal abnormality. Adrenals/Urinary Tract: 3.1 cm right adrenal nodule with Hounsfield units of 23 stable from 2020 exam consistent with benign adenoma. No left adrenal nodule. No hydronephrosis. No intrarenal calculi. There are multiple left retroperitoneal phleboliths that are adjacent to the ureter. Unremarkable urinary bladder. Stomach/Bowel: Small hiatal hernia. Scattered high-density within the stomach and duodenum, some of which appears linear. These may be related to clips or ingested material. Enteric contrast is seen within distal small bowel and proximal colon, there is no obstruction. No bowel inflammation. Normal appendix is seen. Moderate volume of stool in the right colon. Small volume of colonic stool distally. Vascular/Lymphatic: Normal caliber abdominal aorta. Multiple retroperitoneal phleboliths. There is no evidence of retroperitoneal stranding or hemorrhage. Reproductive: Status post hysterectomy. No adnexal masses. Other: No free air or free fluid. No retroperitoneal or intra-abdominopelvic stranding. There is mild edema in the subcutaneous flank tissues. Small fat containing umbilical hernia. Musculoskeletal: Anterior fusion L4-L5. Lumbar facet hypertrophy. There are no acute or suspicious osseous abnormalities.  IMPRESSION: 1. No acute abnormality in the abdomen/pelvis. No evidence of retroperitoneal stranding or hemorrhage. 2. Nodular and ground-glass opacities within the left lower lobe and lingula, suspicious for pneumonia. 3. Hepatic steatosis and mild hepatomegaly. 4. Stable right adrenal adenoma, needing no further imaging follow-up. Electronically Signed   By: Narda Rutherford M.D.   On: 08/27/2022 19:05    Medications: Scheduled:  sodium chloride   Intravenous Once   cloNIDine  0.1 mg Oral BID   diltiazem  240 mg Oral QAC breakfast   DULoxetine  90 mg Oral QHS   fluticasone  2 spray Each Nare BID   furosemide  80 mg Oral Daily   gabapentin  300 mg Oral TID   insulin aspart  0-15 Units Subcutaneous TID WC   insulin aspart  0-5 Units Subcutaneous QHS   insulin glargine-yfgn  10 Units Subcutaneous QHS   lidocaine  1 patch Transdermal Q24H   linaclotide  145 mcg Oral QAC breakfast   methocarbamol  500 mg Oral TID   mometasone-formoterol  2 puff Inhalation BID   pantoprazole  40 mg Oral BID AC   pravastatin  40 mg Oral QPM   QUEtiapine  50 mg Oral BID   warfarin  5 mg Oral ONCE-1600   Warfarin - Pharmacist Dosing Inpatient   Does not apply q1600   zonisamide  100 mg Oral Daily   zonisamide  200 mg Oral QHS   Continuous:  Assessment/Plan: 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 2) Friable versus bleeding gastric polyps. 3) Anemia.   Her HGB is stable and there are no overt signs of bleeding.  Her last bowel movement was yesterday.  Plan: 1) Monitor HGB and transfuse as necessary.  LOS: 5 days   [name] D 08/29/2022, 2:06 PM

## 2022-08-29 NOTE — Progress Notes (Signed)
[facility]                                                                              [name], is a 67 y.o. 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  Patient reported dizziness that started a day before admission and left knee almost gave out.  She has been having left knee pain and has been operated on previously by Dr. August Saucer.  On the day of admission, she felt more dizzy, diaphoretic, no chest pain abdominal pain or any headaches.  She continued to have constant knee pain, called Dr. Lyndle Herrlich office and had an appointment however could not make it to the appointment due to dizziness.   Patient denies taking NSAIDs for the knee pain.  Takes PPI for GERD  No nausea, vomiting, abdominal pain or diarrhea.  Patient takes Coumadin for history of DVTs, followed by Dr. Alcide Evener.  Per patient, she was having low INR hence she had recently increased her Coumadin.  In ED, temp 98.3, RR 27, heart rate 93, BP initially 133/82, then 111/97, O2 sats 97% on room air CMET unremarkable, CBC showed hemoglobin 9.9, was 11.6 on 06/22/2022 INR 7.0 FOBT positive   Assessment & Plan    Principal Problem:   Near syncope -Patient noted to be borderline BP on multiple antihypertensives outpatient (clonidine, Cardizem, Lasix, Aldactone), held on admission.   -Patient received 500 cc IVF bolus in ED -Troponins x 2 negative.  EKG did not show acute ST-T wave changes suggestive of ischemia.  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  GI bleed -FOBT positive with supratherapeutic INR.  Patient has history of GERD however denies taking any NSAIDs -Baseline hb 11- 12, Hb on admission 9.9->  6.1 on 6/7 s/p 2 units packed RBCs -Continue Protonix 40 mg twice daily -EGD 6/7 showed few gastric polyps, clips placed, single bleeding angiectasia in the duodenum, treated with monopolar probe, clips placed ---CT abdomen pelvis did not show any retroperitoneal hemorrhage or  hematoma.  -Patient had enema yesterday with black stool, GI following, follow H&H  Acute on chronic left knee pain, fibromyalgia  -Patient has a prior history of left knee arthroplasty.  X-ray showed small suprapatellar joint effusion, no signs of periprosthetic fracture loosening or dislocation -CRP 2.4, ESR 81, uric acid 6.1 -CT left knee showed arthroplasty with intact hardware, no erosive changes, no CT evidence to suggest infectious or inflammatory process, joint aspiration can be considered. -per patient, her pain is somewhat better with the current regimen  -Orthopedics following      Chronic diastolic CHF (congestive heart failure) (HCC) -2D echo 6/6 showed EF of 65-70%, G1 DD -Currently euvolemic      Mixed hyperlipidemia -Continue statin  ?  Left lower lobe and lingular pneumonia on CT scan -Currently no fevers, leukocytosis or  respiratory symptoms.  Hold off on antibiotics unless spikes fevers.  Obesity Estimated body mass index is 45.51 kg/m as calculated from the following:   Height as of this encounter: 5\' 6"  (1.676 m).   Weight as of this encounter: 127.9 kg.  Code Status: Full code DVT Prophylaxis:  SCDs Start: 08/23/22 1948   Level of Care: Level of care: Telemetry Family Communication:  Disposition Plan:      Remains inpatient appropriate:   Once H&H stable, resume Coumadin and DC home  Procedures:  EGD  Consultants:   GI Orthopedics Cardiology  Antimicrobials: None   Medications  sodium chloride   Intravenous Once   cloNIDine  0.1 mg Oral BID   diltiazem  240 mg Oral QAC breakfast   DULoxetine  90 mg Oral QHS   fluticasone  2 spray Each Nare BID   furosemide  80 mg Oral Daily   gabapentin  300 mg Oral TID   insulin aspart  0-15 Units Subcutaneous TID WC   insulin aspart  0-5 Units Subcutaneous QHS   insulin glargine-yfgn  10 Units Subcutaneous QHS   lidocaine  1 patch Transdermal Q24H   linaclotide  145 mcg Oral QAC breakfast   methocarbamol  500 mg Oral TID   mometasone-formoterol  2 puff Inhalation BID   pantoprazole  40 mg Oral BID AC   pravastatin  40 mg Oral QPM   QUEtiapine  50 mg Oral BID   Warfarin - Pharmacist Dosing Inpatient   Does not apply q1600   zonisamide  100 mg Oral Daily   zonisamide  200 mg Oral QHS      Subjective:   [name] was seen and examined today.  Per patient, had bowel movement with enema yesterday, black stools.  No nausea vomiting, acute abdominal pain.  Left knee pain improving   Objective:   Vitals:   08/29/22 0526 08/29/22 0814 08/29/22 0919 08/29/22 1150  BP: (!) 150/80   (!) 147/84  Pulse: 84   86  Resp: 18  18 18   Temp: 97.7 F (36.5 C)   98 F (36.7 C)  TempSrc: Oral   Oral  SpO2: 94% 94%  98%  Weight:      Height:        Intake/Output Summary (Last 24 hours) at 08/29/2022 1539 Last data filed at 08/29/2022 0528 Gross  per 24 hour  Intake 545 ml  Output --  Net 545 ml      Wt Readings from Last 3 Encounters:  08/23/22 127.9 kg  07/29/22 127.9 kg  07/27/22 131.1 kg   Physical Exam General: Alert and oriented x 3, NAD Cardiovascular: S1 S2 clear,  RRR.  Respiratory: CTAB, no wheezing Gastrointestinal: Soft, nontender, nondistended, NBS Ext: no pedal edema bilaterally Neuro: no new deficits Psych: Normal   Data Reviewed:  I have personally reviewed following labs    CBC Lab Results  Component Value Date   WBC 14.1 (H) 08/29/2022   RBC 2.64 (L) 08/29/2022   HGB 7.2 (L) 08/29/2022   HCT 23.5 (L) 08/29/2022   MCV 89.0 08/29/2022   MCH 27.3 08/29/2022   PLT 328 08/29/2022   MCHC 30.6 08/29/2022   RDW 18.1 (H) 08/29/2022   LYMPHSABS 2.2 08/23/2022   MONOABS 0.7 08/23/2022   EOSABS 0.1 08/23/2022   BASOSABS 0.0 08/23/2022     Last metabolic panel Lab Results  Component Value Date   NA 138 08/29/2022   K 3.7 08/29/2022   CL 101 08/29/2022   CO2 28 08/29/2022   BUN 19 08/29/2022   CREATININE 0.82 08/29/2022   GLUCOSE 158 (H) 08/29/2022   GFRNONAA >60 08/29/2022   GFRAA 94 03/05/2020   CALCIUM 8.1 (L) 08/29/2022   PHOS 2.7 06/12/2014   PROT 7.7 08/23/2022   ALBUMIN 3.4 (L) 08/23/2022   BILITOT 0.1 (L) 08/23/2022   ALKPHOS 65 08/23/2022   AST 16 08/23/2022   ALT 19 08/23/2022   ANIONGAP 9 08/29/2022    CBG (last 3)  Recent Labs    08/28/22 2126 08/29/22 0733 08/29/22 1111  GLUCAP 155* 139* 134*      Coagulation Profile: Recent Labs  Lab 08/25/22 0625 08/26/22 0500 08/27/22 1552 08/28/22 0625 08/29/22 0552  INR 2.4* 2.1* 1.5* 1.3* 1.2     Radiology Studies: I have personally reviewed the imaging studies  No results found.     Thad Ranger M.D. [facility] 08/29/2022, 3:39 PM  Available via Epic secure chat 7am-7pm After 7 pm, please refer to night coverage provider listed on amion.

## 2022-08-29 NOTE — Progress Notes (Addendum)
ANTICOAGULATION CONSULT NOTE - Follow Up Consult  Pharmacy Consult for warfarin Indication: DVT  Allergies  Allergen Reactions   Lisinopril Anaphylaxis, Swelling and Other (See Comments)    THROAT SWELLING  Pt states her throat started to "close up" after being on it for "so long."  THROAT SWELLING, Pt states her throat started to "close up" after being on it for "so long."   Prednisone Anaphylaxis, Swelling and Other (See Comments)    Throat swelling and tongue irritation   Spironolactone Other (See Comments)    Caused stroke-like symptoms with the mouth   Corticosteroids Rash    States she developed a rash on her tongue after a steroid injection in her back    Patient Measurements: Height: 5\' 6"  (167.6 cm) Weight: 127.9 kg (281 lb 15.5 oz) IBW/kg (Calculated) : 59.3 Heparin Dosing Weight:   Vital Signs: Temp: 97.7 F (36.5 C) (06/11 0526) Temp Source: Oral (06/11 0526) BP: 150/80 (06/11 0526) Pulse Rate: 84 (06/11 0526)  Labs: Recent Labs    08/27/22 0550 08/27/22 1552 08/28/22 0625 08/29/22 0552  HGB 7.5*  --  7.0* 7.2*  HCT 23.6*  --  22.2* 23.5*  PLT 266  --  297 328  LABPROT  --  18.7* 16.0* 15.0  INR  --  1.5* 1.3* 1.2  CREATININE 0.74  --  0.85 0.82    Estimated Creatinine Clearance: 91.1 mL/min (by C-G formula based on SCr of 0.82 mg/dL).   Medications:  Scheduled:   sodium chloride   Intravenous Once   cloNIDine  0.1 mg Oral BID   diltiazem  240 mg Oral QAC breakfast   DULoxetine  90 mg Oral QHS   fluticasone  2 spray Each Nare BID   furosemide  80 mg Oral Daily   gabapentin  300 mg Oral TID   insulin aspart  0-15 Units Subcutaneous TID WC   insulin aspart  0-5 Units Subcutaneous QHS   insulin glargine-yfgn  10 Units Subcutaneous QHS   lidocaine  1 patch Transdermal Q24H   linaclotide  145 mcg Oral QAC breakfast   methocarbamol  500 mg Oral TID   mometasone-formoterol  2 puff Inhalation BID   pantoprazole  40 mg Oral BID AC   pravastatin   40 mg Oral QPM   QUEtiapine  50 mg Oral BID   zonisamide  100 mg Oral Daily   zonisamide  200 mg Oral QHS   Infusions:   Assessment: [name] presented on 6/5 for dizziness, near syncope, and left knee pain.   PMH includes DVTs, Lupus anticoagulant clotting disorder on chronic warfarin.  Anticoagulation was held on admission for supratherapeutic INR 7, and possible GI bleed.  Underwent EGD and clips placed 6/7.  Pharmacy is consulted to resume warfarin dosing.  PTA warfarin: 10 mg daily except 7.5 mg MWF (LD 6/4).  Admit INR 7  Significant events: 6/5: Vitamin K 5 mg PO 6/6: Vitamin K 2.5 mg SQ 6/7: 2 units PRBC, upper GI endoscopy 6/10 warfarin resumed  Today, 08/29/2022: INR 1.2, subtherapeutic as expected after only one warfarin dose. CBC:  Hgb slightly improved to 7.2, Plt WNL  No bleeding or complications reported.   Diet:  carb modified No major drug-drug interactions noted.    [place] of Therapy:  INR 2-3 Monitor platelets by anticoagulation protocol: Yes   Plan:  Warfarin 5mg  PO x1 dose today at 16:00 Daily PT/INR. Monitor for signs and symptoms of bleeding. Follow up daily for dosing and outpatient discharge  plan.    Lynann Beaver PharmD, BCPS [facility] main pharmacy [phone] 08/29/2022 8:26 AM

## 2022-08-29 NOTE — Progress Notes (Signed)
Pt on CPAP tolerating well. 

## 2022-08-30 ENCOUNTER — Encounter (HOSPITAL_COMMUNITY): Payer: Self-pay | Admitting: Gastroenterology

## 2022-08-30 ENCOUNTER — Inpatient Hospital Stay: Payer: 59

## 2022-08-30 ENCOUNTER — Inpatient Hospital Stay (HOSPITAL_COMMUNITY): Payer: 59

## 2022-08-30 DIAGNOSIS — I5032 Chronic diastolic (congestive) heart failure: Secondary | ICD-10-CM | POA: Diagnosis not present

## 2022-08-30 DIAGNOSIS — K922 Gastrointestinal hemorrhage, unspecified: Secondary | ICD-10-CM | POA: Diagnosis not present

## 2022-08-30 DIAGNOSIS — R55 Syncope and collapse: Secondary | ICD-10-CM | POA: Diagnosis not present

## 2022-08-30 LAB — BASIC METABOLIC PANEL
Anion gap: 12 (ref 5–15)
BUN: 17 mg/dL (ref 8–23)
CO2: 23 mmol/L (ref 22–32)
Calcium: 7.9 mg/dL — ABNORMAL LOW (ref 8.9–10.3)
Chloride: 98 mmol/L (ref 98–111)
Creatinine, Ser: 0.79 mg/dL (ref 0.44–1.00)
GFR, Estimated: >60 mL/min (ref >60)
Glucose, Bld: 160 mg/dL — ABNORMAL HIGH (ref 70–99)
Potassium: 3.1 mmol/L — ABNORMAL LOW (ref 3.5–5.1)
Sodium: 133 mmol/L — ABNORMAL LOW (ref 135–145)

## 2022-08-30 LAB — URINALYSIS, ROUTINE W REFLEX MICROSCOPIC
Bilirubin Urine: NEGATIVE
Glucose, UA: NEGATIVE mg/dL
Hgb urine dipstick: NEGATIVE
Ketones, ur: NEGATIVE mg/dL
Leukocytes,Ua: NEGATIVE
Nitrite: NEGATIVE
Protein, ur: NEGATIVE mg/dL
Specific Gravity, Urine: 1.012 (ref 1.005–1.030)
pH: 5 (ref 5.0–8.0)

## 2022-08-30 LAB — GLUCOSE, CAPILLARY
Glucose-Capillary: 141 mg/dL — ABNORMAL HIGH (ref 70–99)
Glucose-Capillary: 133 mg/dL — ABNORMAL HIGH (ref 70–99)
Glucose-Capillary: 165 mg/dL — ABNORMAL HIGH (ref 70–99)
Glucose-Capillary: 151 mg/dL — ABNORMAL HIGH (ref 70–99)

## 2022-08-30 LAB — CBC
HCT: 23.7 % — ABNORMAL LOW (ref 36.0–46.0)
Hemoglobin: 7.3 g/dL — ABNORMAL LOW (ref 12.0–15.0)
MCH: 27.2 pg (ref 26.0–34.0)
MCHC: 30.8 g/dL (ref 30.0–36.0)
MCV: 88.4 fL (ref 80.0–100.0)
Platelets: 326 10*3/uL (ref 150–400)
RBC: 2.68 MIL/uL — ABNORMAL LOW (ref 3.87–5.11)
RDW: 17.5 % — ABNORMAL HIGH (ref 11.5–15.5)
WBC: 12.3 10*3/uL — ABNORMAL HIGH (ref 4.0–10.5)
nRBC: 5.1 % — ABNORMAL HIGH (ref 0.0–0.2)

## 2022-08-30 LAB — COMPREHENSIVE METABOLIC PANEL
ALT: 24 U/L (ref 0–44)
AST: 18 U/L (ref 15–41)
Albumin: 3.1 g/dL — ABNORMAL LOW (ref 3.5–5.0)
Alkaline Phosphatase: 75 U/L (ref 38–126)
Anion gap: 10 (ref 5–15)
BUN: 16 mg/dL (ref 8–23)
CO2: 26 mmol/L (ref 22–32)
Calcium: 8.1 mg/dL — ABNORMAL LOW (ref 8.9–10.3)
Chloride: 101 mmol/L (ref 98–111)
Creatinine, Ser: 0.94 mg/dL (ref 0.44–1.00)
GFR, Estimated: >60 mL/min (ref >60)
Glucose, Bld: 151 mg/dL — ABNORMAL HIGH (ref 70–99)
Potassium: 3.2 mmol/L — ABNORMAL LOW (ref 3.5–5.1)
Sodium: 137 mmol/L (ref 135–145)
Total Bilirubin: 0.4 mg/dL (ref 0.3–1.2)
Total Protein: 7.2 g/dL (ref 6.5–8.1)

## 2022-08-30 LAB — PROTIME-INR
INR: 1.2 (ref 0.8–1.2)
Prothrombin Time: 15.6 seconds — ABNORMAL HIGH (ref 11.4–15.2)

## 2022-08-30 LAB — PROCALCITONIN: Procalcitonin: <0.1 ng/mL

## 2022-08-30 LAB — SEDIMENTATION RATE: Sed Rate: 108 mm/hr — ABNORMAL HIGH (ref 0–22)

## 2022-08-30 MED ORDER — IOHEXOL 300 MG/ML  SOLN
100.0000 mL | Freq: Once | INTRAMUSCULAR | Status: AC | PRN
  Administered 2022-08-30: 100 mL via INTRAVENOUS

## 2022-08-30 MED ORDER — IOHEXOL 9 MG/ML PO SOLN
ORAL | Status: AC
  Filled 2022-08-30: qty 1000

## 2022-08-30 MED ORDER — POTASSIUM CHLORIDE CRYS ER 20 MEQ PO TBCR
40.0000 meq | EXTENDED_RELEASE_TABLET | Freq: Two times a day (BID) | ORAL | Status: AC
  Administered 2022-08-30: 40 meq via ORAL
  Filled 2022-08-30 (×2): qty 2

## 2022-08-30 MED ORDER — IOHEXOL 9 MG/ML PO SOLN
500.0000 mL | ORAL | Status: AC
  Administered 2022-08-30 (×2): 500 mL via ORAL

## 2022-08-30 MED ORDER — HYDROMORPHONE HCL 1 MG/ML IJ SOLN
1.0000 mg | INTRAMUSCULAR | Status: DC | PRN
  Administered 2022-08-30 – 2022-09-01 (×10): 1 mg via INTRAVENOUS
  Filled 2022-08-30 (×11): qty 1

## 2022-08-30 MED ORDER — POTASSIUM CHLORIDE CRYS ER 20 MEQ PO TBCR
40.0000 meq | EXTENDED_RELEASE_TABLET | Freq: Two times a day (BID) | ORAL | Status: AC
  Administered 2022-08-30 (×2): 40 meq via ORAL
  Filled 2022-08-30 (×2): qty 2

## 2022-08-30 MED ORDER — SODIUM CHLORIDE 0.9 % IV SOLN
500.0000 mg | Freq: Every day | INTRAVENOUS | Status: DC
  Administered 2022-08-30 – 2022-08-31 (×2): 500 mg via INTRAVENOUS
  Filled 2022-08-30 (×2): qty 5

## 2022-08-30 MED ORDER — SODIUM CHLORIDE 0.9 % IV SOLN
1.0000 g | Freq: Every day | INTRAVENOUS | Status: DC
  Administered 2022-08-30 – 2022-08-31 (×2): 1 g via INTRAVENOUS
  Filled 2022-08-30 (×2): qty 10

## 2022-08-30 MED ORDER — WARFARIN SODIUM 5 MG PO TABS
7.5000 mg | ORAL_TABLET | Freq: Once | ORAL | Status: AC
  Administered 2022-08-30: 7.5 mg via ORAL
  Filled 2022-08-30: qty 1

## 2022-08-30 NOTE — Progress Notes (Signed)
ANTICOAGULATION CONSULT NOTE - Follow Up Consult  Pharmacy Consult for warfarin Indication: DVT  Allergies  Allergen Reactions   Lisinopril Anaphylaxis, Swelling and Other (See Comments)    THROAT SWELLING  Pt states her throat started to "close up" after being on it for "so long."  THROAT SWELLING, Pt states her throat started to "close up" after being on it for "so long."   Prednisone Anaphylaxis, Swelling and Other (See Comments)    Throat swelling and tongue irritation   Spironolactone Other (See Comments)    Caused stroke-like symptoms with the mouth   Corticosteroids Rash    States she developed a rash on her tongue after a steroid injection in her back    Patient Measurements: Height: 5\' 6"  (167.6 cm) Weight: 127.9 kg (281 lb 15.5 oz) IBW/kg (Calculated) : 59.3 Heparin Dosing Weight:   Vital Signs: Temp: 98.1 F (36.7 C) (06/12 0527) Temp Source: Oral (06/12 0527) BP: 141/78 (06/12 0527) Pulse Rate: 86 (06/12 0527)  Labs: Recent Labs    08/28/22 0625 08/29/22 0552 08/30/22 0544 08/30/22 1018 08/30/22 1130  HGB 7.0* 7.2* 7.3*  --   --   HCT 22.2* 23.5* 23.7*  --   --   PLT 297 328 326  --   --   LABPROT 16.0* 15.0  --   --  15.6*  INR 1.3* 1.2  --   --  1.2  CREATININE 0.85 0.82 0.79 0.94  --      Estimated Creatinine Clearance: 79.5 mL/min (by C-G formula based on SCr of 0.94 mg/dL).   Medications:  Scheduled:   sodium chloride   Intravenous Once   cloNIDine  0.1 mg Oral BID   diltiazem  240 mg Oral QAC breakfast   DULoxetine  90 mg Oral QHS   fluticasone  2 spray Each Nare BID   furosemide  80 mg Oral Daily   gabapentin  300 mg Oral TID   insulin aspart  0-15 Units Subcutaneous TID WC   insulin aspart  0-5 Units Subcutaneous QHS   insulin glargine-yfgn  10 Units Subcutaneous QHS   lidocaine  1 patch Transdermal Q24H   linaclotide  145 mcg Oral QAC breakfast   methocarbamol  500 mg Oral TID   mometasone-formoterol  2 puff Inhalation BID    pantoprazole  40 mg Oral BID AC   potassium chloride  40 mEq Oral BID   potassium chloride  40 mEq Oral BID   pravastatin  40 mg Oral QPM   QUEtiapine  50 mg Oral BID   warfarin  7.5 mg Oral ONCE-1600   Warfarin - Pharmacist Dosing Inpatient   Does not apply q1600   zonisamide  100 mg Oral Daily   zonisamide  200 mg Oral QHS   Infusions:   Assessment: 57 yoF presented on 6/5 for dizziness, near syncope, and left knee pain.   PMH includes DVTs, Lupus anticoagulant clotting disorder on chronic warfarin.  Anticoagulation was held on admission for supratherapeutic INR 7, and possible GI bleed.  Underwent EGD and clips placed 6/7.  Pharmacy is consulted to resume warfarin dosing.  PTA warfarin: 10 mg daily except 7.5 mg MWF (LD 6/4).  Admit INR 7  Significant events: 6/5: Vitamin K 5 mg PO 6/6: Vitamin K 2.5 mg SQ 6/7: 2 units PRBC, upper GI endoscopy 6/10 warfarin resumed  Today, 08/30/2022: INR 1.2, subtherapeutic  CBC:  Hgb slightly improved to 7.3, Plt WNL  No bleeding or complications reported.   Diet:  carb modified No major drug-drug interactions noted.   [place] of Therapy:  INR 2-3 Monitor platelets by anticoagulation protocol: Yes   Plan:  Warfarin 7.5mg  PO x1 dose today at 16:00 Daily PT/INR. Monitor for signs and symptoms of bleeding. Follow up daily for dosing and outpatient discharge plan.    Tacy Learn, PharmD Clinical Pharmacist 08/30/2022 1:04 PM

## 2022-08-30 NOTE — TOC Progression Note (Addendum)
Transition of Care [facility]) - Progression Note    Patient Details  Name: [name] MRN: [id number] Date of Birth: 1956/02/27  Transition of [facility]) CM/SW Contact  Otelia Santee, LCSW Phone Number: 08/30/2022, 11:53 AM  Clinical Narrative:    Per PT pt will most likely need SNF vs home w/ 24/7 care. Met with pt who shares she would like to go to SNF before returning home. Pt states she has been to SNF in the past but, unsure of where she went.  Pt's PASRR is currently pending. Additional documentation faxed in to [facility] for review.   Update 2:20pm- Pt's PASRR returned: [phone] [id number]. Will fax pt out for SNF closer to medical readiness.    Expected Discharge Plan: Home/Self Care    Expected Discharge Plan and Services                                               Social Determinants of Health (SDOH) Interventions SDOH Screenings   Food Insecurity: No Food Insecurity (08/23/2022)  Housing: Low Risk  (08/23/2022)  Transportation Needs: No Transportation Needs (08/23/2022)  Utilities: Not At Risk (08/23/2022)  Alcohol Screen: Low Risk  (03/03/2022)  Depression (PHQ2-9): Low Risk  (06/22/2022)  Recent Concern: Depression (PHQ2-9) - Medium Risk (03/27/2022)  Financial Resource Strain: Low Risk  (03/03/2022)  Physical Activity: Insufficiently Active (03/03/2022)  Social Connections: Moderately Integrated (03/03/2022)  Stress: No Stress Concern Present (03/03/2022)  Tobacco Use: Low Risk  (08/25/2022)    Readmission Risk Interventions     No data to display

## 2022-08-30 NOTE — Progress Notes (Addendum)
[facility] PROGRESS NOTE    Progress Note  [name]  ZOX:[id number] DOB: 02-14-1956 DOA: 08/23/2022 PCP: [name], MD     Brief Narrative:   [name] is an 67 y.o. female past medical history of diabetes mellitus type 2, COPD history of DVT on Coumadin, peripheral neuropathy comes in with the ED with dizziness that started the day prior to admission.  She also been having left knee pain in the ED labs were unremarkable INR 7.0 FOBT positive and a hemoglobin of 9   Assessment/Plan:   Near syncope: Antihypertensive medications were held on admission she was on Cardizem and Lasix Aldactone and clonidine. Received 500 cc bolus. CT of the head showed no acute intracranial abnormalities. 2D echo showed an EF of 65% grade 1 diastolic dysfunction.  Normocytic anemia/question GI bleed: FOBT positive with a supratherapeutic INR. Baseline hemoglobin like around 11 worsened to 6.1 status post 2 units packed red blood cells. GI was consulted EGD done on 08/25/2022 that showed gastric polyps single bleeding angiectasia. CT scan of the abdomen pelvis showed no acute findings. Appreciate GI assistance, hemoglobin has stabilized 7.2.  Essential hypertension: Antihypertensive medications were held on admission clonidine, Lasix, Cardizem and Aldactone.  History of DVT on Coumadin/supratherapeutic INR: On admission 1.7 vitamin K given, now 1.2. GI to dictate when to start anticoagulation.  Acute on chronic left knee pain/fibromyalgia: Prior history of left knee arthroplasty imaging showed no acute findings. CRP of 2.4. Follow-up with orthopedic as an outpatient.  Chronic diastolic heart failure: Appears euvolemic.  Hypokalemia: KCL tab, recheck  Mixed lipidemia:  continue statins.  Morbid obesity: Noted.  She has been counseled.  Leukocytosis probably due to community-acquired pneumonia: White blood cell count is improving.  She has remained  afebrile. CT abdomen and pelvis on admission and repeated again today 08/30/2022 showed lower lobe infiltrates with leukocytosis, ESR significantly elevated, we will go ahead and start her on empirically Rocephin and azithromycin. Blood cultures have been negative till date from 08/26/2022  New right lower quadrant abdominal pain: Check a CT scan of the abdomen and pelvis check a UA   DVT prophylaxis: SCd Family Communication:sister Status is: Inpatient Remains inpatient appropriate because: Acute GI bleed    Code Status:     Code Status Orders  (From admission, onward)           Start     Ordered   08/23/22 1503  Full code  Continuous       Question:  By:  Answer:  Consent: discussion documented in EHR   08/23/22 1503           Code Status History     Date Active Date Inactive Code Status Order ID Comments User Context   02/28/2021 0839 02/28/2021 1859 Full Code [id number]  Kathleene Hazel, MD Inpatient   03/09/2019 2233 03/16/2019 2113 Full Code [id number]  John Giovanni, MD ED   02/20/2019 0456 02/20/2019 1911 Full Code [id number]  Coletta Memos, MD ED   02/27/2017 1318 02/28/2017 1350 Full Code [id number]  Harriette Bouillon, MD Inpatient   06/12/2014 1948 06/14/2014 1723 Full Code [id number]  Pearson Grippe, MD Inpatient   04/24/2014 2234 04/30/2014 2027 Full Code [id number]  Drema Dallas, MD Inpatient   08/17/2013 1240 08/21/2013 2134 Full Code [id number]  Kathlen Mody, MD Inpatient         IV Access:   Peripheral IV   Procedures and diagnostic studies:   No results found.  Medical Consultants:   None.   Subjective:    [name] complaing abd pain  Objective:    Vitals:   08/29/22 2054 08/29/22 2335 08/30/22 0527 08/30/22 0747  BP:  135/75 (!) 141/78   Pulse:  85 86   Resp:  18 16   Temp:   98.1 F (36.7 C)   TempSrc:   Oral   SpO2: 99% 97% 95% 93%  Weight:      Height:       SpO2: 93 % O2 Flow Rate (L/min): 3 L/min FiO2  (%): 21 %  No intake or output data in the 24 hours ending 08/30/22 0913 Filed Weights   08/23/22 2000  Weight: 127.9 kg    Exam: General exam: In no acute distress. Respiratory system: Good air movement and clear to auscultation. Cardiovascular system: S1 & S2 heard, RRR. No JVD. Gastrointestinal system: Abdomen is nondistended, soft and Right lower quadrant tender.  Extremities: No pedal edema. Skin: No rashes, lesions or ulcers Psychiatry: Judgement and insight appear normal. Mood & affect appropriate.    Data Reviewed:    Labs: Basic Metabolic Panel: Recent Labs  Lab 08/26/22 0500 08/27/22 0550 08/28/22 0625 08/29/22 0552 08/30/22 0544  NA 137 138 137 138 133*  K 3.8 3.4* 4.0 3.7 3.1*  CL 103 104 101 101 98  CO2 23 24 26 28 23   GLUCOSE 195* 169* 189* 158* 160*  BUN 38* 28* 22 19 17   CREATININE 1.02* 0.74 0.85 0.82 0.79  CALCIUM 8.1* 8.0* 8.0* 8.1* 7.9*   GFR Estimated Creatinine Clearance: 93.4 mL/min (by C-G formula based on SCr of 0.79 mg/dL). Liver Function Tests: Recent Labs  Lab 08/23/22 1144  AST 16  ALT 19  ALKPHOS 65  BILITOT 0.1*  PROT 7.7  ALBUMIN 3.4*   No results for input(s): "LIPASE", "AMYLASE" in the last 168 hours. No results for input(s): "AMMONIA" in the last 168 hours. Coagulation profile Recent Labs  Lab 08/25/22 0625 08/26/22 0500 08/27/22 1552 08/28/22 0625 08/29/22 0552  INR 2.4* 2.1* 1.5* 1.3* 1.2   COVID-19 Labs  No results for input(s): "DDIMER", "FERRITIN", "LDH", "CRP" in the last 72 hours.  Lab Results  Component Value Date   SARSCOV2NAA NEGATIVE 08/08/2019   SARSCOV2NAA Not Detected 04/04/2019   SARSCOV2NAA POSITIVE (A) 03/09/2019    CBC: Recent Labs  Lab 08/23/22 1144 08/24/22 0539 08/26/22 0500 08/27/22 0550 08/28/22 0625 08/29/22 0552 08/30/22 0544  WBC 9.1   < > 27.3* 17.1* 15.4* 14.1* 12.3*  NEUTROABS 6.0  --   --   --   --   --   --   HGB 9.9*   < > 7.8* 7.5* 7.0* 7.2* 7.3*  HCT 31.5*   < >  24.4* 23.6* 22.2* 23.5* 23.7*  MCV 82.9   < > 86.8 88.4 88.1 89.0 88.4  PLT 343   < > 260 266 297 328 326   < > = values in this interval not displayed.   Cardiac Enzymes: No results for input(s): "CKTOTAL", "CKMB", "CKMBINDEX", "TROPONINI" in the last 168 hours. BNP (last 3 results) No results for input(s): "PROBNP" in the last 8760 hours. CBG: Recent Labs  Lab 08/29/22 0733 08/29/22 1111 08/29/22 1640 08/29/22 2050 08/30/22 0719  GLUCAP 139* 134* 150* 157* 141*   D-Dimer: No results for input(s): "DDIMER" in the last 72 hours. Hgb A1c: No results for input(s): "HGBA1C" in the last 72 hours. Lipid Profile: No results for input(s): "CHOL", "HDL", "LDLCALC", "  TRIG", "CHOLHDL", "LDLDIRECT" in the last 72 hours. Thyroid function studies: No results for input(s): "TSH", "T4TOTAL", "T3FREE", "THYROIDAB" in the last 72 hours.  Invalid input(s): "FREET3" Anemia work up: No results for input(s): "VITAMINB12", "FOLATE", "FERRITIN", "TIBC", "IRON", "RETICCTPCT" in the last 72 hours. Sepsis Labs: Recent Labs  Lab 08/26/22 1411 08/27/22 0550 08/28/22 0625 08/29/22 0552 08/30/22 0544  PROCALCITON 0.18  --   --   --   --   WBC  --  17.1* 15.4* 14.1* 12.3*   Microbiology Recent Results (from the past 240 hour(s))  Culture, blood (Routine X 2) w Reflex to ID Panel     Status: None (Preliminary result)   Collection Time: 08/26/22  2:11 PM   Specimen: BLOOD  Result Value Ref Range Status   Specimen Description   Final    BLOOD BLOOD RIGHT ARM Performed at [facility], [address], [place], [place] [address]    Special Requests   Final    BOTTLES DRAWN AEROBIC AND ANAEROBIC Blood Culture adequate volume Performed at [facility], [address], [place], [place] [address]    Culture   Final    NO GROWTH 3 DAYS Performed at [facility], [address], [place], [place] [address]    Report Status PENDING  Incomplete  Culture,  blood (Routine X 2) w Reflex to ID Panel     Status: None (Preliminary result)   Collection Time: 08/26/22  2:16 PM   Specimen: BLOOD  Result Value Ref Range Status   Specimen Description   Final    BLOOD BLOOD LEFT ARM Performed at [facility], [address] [place], [place] [address]    Special Requests   Final    BOTTLES DRAWN AEROBIC AND ANAEROBIC Blood Culture adequate volume Performed at [facility], [address], [place], [place] [address]    Culture   Final    NO GROWTH 3 DAYS Performed at [facility], [address], [place], [place] [address]    Report Status PENDING  Incomplete     Medications:    sodium chloride   Intravenous Once   cloNIDine  0.1 mg Oral BID   diltiazem  240 mg Oral QAC breakfast   DULoxetine  90 mg Oral QHS   fluticasone  2 spray Each Nare BID   furosemide  80 mg Oral Daily   gabapentin  300 mg Oral TID   insulin aspart  0-15 Units Subcutaneous TID WC   insulin aspart  0-5 Units Subcutaneous QHS   insulin glargine-yfgn  10 Units Subcutaneous QHS   lidocaine  1 patch Transdermal Q24H   linaclotide  145 mcg Oral QAC breakfast   methocarbamol  500 mg Oral TID   mometasone-formoterol  2 puff Inhalation BID   pantoprazole  40 mg Oral BID AC   pravastatin  40 mg Oral QPM   QUEtiapine  50 mg Oral BID   Warfarin - Pharmacist Dosing Inpatient   Does not apply q1600   zonisamide  100 mg Oral Daily   zonisamide  200 mg Oral QHS   Continuous Infusions:    LOS: 6 days   [name]  [facility]  08/30/2022, 9:13 AM

## 2022-08-30 NOTE — NC FL2 (Addendum)
[place] MEDICAID FL2 LEVEL OF CARE FORM     IDENTIFICATION  Patient Name: [name] Birthdate: 06-23-1955 Sex: female Admission Date (Current Location): 08/23/2022  [facility] and [place] Number:  Producer, television/film/video and Address:  [facility],  [address], [place] [address]      Provider Number: [id number]  Attending Physician Name and Address:  Marinda Elk, MD  Relative Name and Phone Number:       Current Level of Care: Hospital Recommended Level of Care: Skilled Nursing Facility Prior Approval Number:    Date Approved/Denied:   PASRR Number: [id number]  Discharge Plan: SNF    Current Diagnoses: Patient Active Problem List   Diagnosis Date Noted   Near syncope 08/23/2022   Normocytic anemia 08/23/2022   Left knee pain 08/23/2022   Mixed hyperlipidemia 10/07/2021   Paroxysmal SVT (supraventricular tachycardia) 10/07/2021   Allergic rhinitis 11/29/2020   Allergic rhinitis due to pollen 11/29/2020   Moderate persistent asthma without complication 11/29/2020   Osteoarthritis of spine with radiculopathy, lumbar region 08/24/2020   Chronic back pain 08/18/2020   Chronic heart failure with preserved ejection fraction (HCC) 02/27/2020   Obesity, diabetes, and hypertension syndrome (HCC) 02/27/2020   Vitreomacular adhesion of both eyes 02/09/2020   Pseudophakia of both eyes 02/09/2020   Chronic diastolic CHF (congestive heart failure) (HCC) 03/12/2019   Hypernatremia 03/09/2019   Suspected COVID-19 virus infection 03/09/2019   Acute encephalopathy 03/09/2019   Right sided weakness 02/20/2019   Thyroid mass 02/27/2017   Chronic pain of right knee 05/24/2016   Effusion, right knee 04/28/2016   Presence of right artificial knee joint 04/28/2016   Abnormal LFTs    Decreased sensation    Paresthesia 06/12/2014   Obstruction of kidney 04/24/2014   Diabetes type 2, uncontrolled 04/24/2014   Chronic anticoagulation 04/24/2014   Crack  cocaine use 04/24/2014   Depression 04/24/2014   Fibromyalgia 04/24/2014   OSA (obstructive sleep apnea) 04/24/2014   Lupus anticoagulant positive 04/24/2014   Adrenal mass, right (HCC) 04/24/2014   Right kidney mass 04/24/2014   Hydronephrosis of right kidney 04/24/2014   Supratherapeutic INR 04/24/2014   Renal mass, right 04/24/2014   Diabetes mellitus with coincident hypertension (HCC)    Right lower quadrant abdominal pain    Morbid obesity (HCC) 08/21/2013   Constipation 08/19/2013   Lumbosacral spondylosis without myelopathy 11/19/2012   Bilateral deep vein thromboses (HCC) 12/13/2011   Abdominal pain 11/05/2011   Hypokalemia 11/05/2011   Sciatica 11/05/2011   Diabetes mellitus type 2 in obese (HCC) 11/05/2011   HTN (hypertension) 11/05/2011    Orientation RESPIRATION BLADDER Height & Weight     Self, Time, Situation, Place  Normal Continent Weight: 281 lb 15.5 oz (127.9 kg) Height:  5\' 6"  (167.6 cm)  BEHAVIORAL SYMPTOMS/MOOD NEUROLOGICAL BOWEL NUTRITION STATUS      Continent Diet (Regular)  AMBULATORY STATUS COMMUNICATION OF NEEDS Skin   Limited Assist Verbally Normal                       Personal Care Assistance Level of Assistance  Bathing, Dressing, Feeding Bathing Assistance: Maximum assistance Feeding assistance: Limited assistance Dressing Assistance: Maximum assistance     Functional Limitations Info  Sight, Hearing, Speech Sight Info: Impaired Hearing Info: Adequate Speech Info: Adequate    SPECIAL CARE FACTORS FREQUENCY  PT (By licensed PT), OT (By licensed OT)     PT Frequency: 5x/wk OT Frequency: 5x/wk  Contractures Contractures Info: Not present    Additional Factors Info  Code Status, Allergies, Psychotropic Code Status Info: FULL Allergies Info: Lisinopril, Prednisone, Spironolactone, Corticosteroids Psychotropic Info: See MAR         Current Medications (08/30/2022):  This is the current hospital active  medication list Current Facility-Administered Medications  Medication Dose Route Frequency Provider Last Rate Last Admin   0.9 %  sodium chloride infusion (Manually program via Guardrails IV Fluids)   Intravenous Once Rai, Ripudeep K, MD       albuterol (PROVENTIL) (2.5 MG/3ML) 0.083% nebulizer solution 2.5 mg  2.5 mg Nebulization Q6H PRN Rai, Ripudeep K, MD       cloNIDine (CATAPRES) tablet 0.1 mg  0.1 mg Oral BID Rai, Ripudeep K, MD   0.1 mg at 08/30/22 0930   diltiazem (CARDIZEM CD) 24 hr capsule 240 mg  240 mg Oral QAC breakfast Rai, Ripudeep K, MD   240 mg at 08/30/22 0731   DULoxetine (CYMBALTA) DR capsule 90 mg  90 mg Oral QHS Rai, Ripudeep K, MD   90 mg at 08/29/22 2119   fluticasone (FLONASE) 50 MCG/ACT nasal spray 2 spray  2 spray Each Nare BID Rai, Ripudeep K, MD   2 spray at 08/30/22 1052   furosemide (LASIX) tablet 80 mg  80 mg Oral Daily Rai, Ripudeep K, MD   80 mg at 08/30/22 0929   gabapentin (NEURONTIN) capsule 300 mg  300 mg Oral TID Rai, Ripudeep K, MD   300 mg at 08/30/22 0930   HYDROmorphone (DILAUDID) injection 1 mg  1 mg Intravenous Q2H PRN Marinda Elk, MD   1 mg at 08/30/22 1208   insulin aspart (novoLOG) injection 0-15 Units  0-15 Units Subcutaneous TID WC Rai, Ripudeep K, MD   2 Units at 08/30/22 1143   insulin aspart (novoLOG) injection 0-5 Units  0-5 Units Subcutaneous QHS Rai, Ripudeep K, MD   2 Units at 08/26/22 2123   insulin glargine-yfgn (SEMGLEE) injection 10 Units  10 Units Subcutaneous QHS Rai, Ripudeep K, MD   10 Units at 08/29/22 2121   lidocaine (LIDODERM) 5 % 1 patch  1 patch Transdermal Q24H Rai, Ripudeep K, MD   1 patch at 08/30/22 0932   linaclotide (LINZESS) capsule 145 mcg  145 mcg Oral QAC breakfast Rai, Ripudeep K, MD   145 mcg at 08/29/22 1308   methocarbamol (ROBAXIN) tablet 500 mg  500 mg Oral TID Rai, Ripudeep K, MD   500 mg at 08/30/22 0930   mometasone-formoterol (DULERA) 200-5 MCG/ACT inhaler 2 puff  2 puff Inhalation BID Rai, Ripudeep  K, MD   2 puff at 08/30/22 0747   oxyCODONE (Oxy IR/ROXICODONE) immediate release tablet 5 mg  5 mg Oral Q4H PRN Rai, Ripudeep K, MD   5 mg at 08/30/22 0326   pantoprazole (PROTONIX) EC tablet 40 mg  40 mg Oral BID AC Rai, Ripudeep K, MD   40 mg at 08/30/22 0731   polyethylene glycol (MIRALAX / GLYCOLAX) packet 17 g  17 g Oral Daily PRN Rai, Ripudeep K, MD       potassium chloride SA (KLOR-CON M) CR tablet 40 mEq  40 mEq Oral BID Marinda Elk, MD   40 mEq at 08/30/22 0936   potassium chloride SA (KLOR-CON M) CR tablet 40 mEq  40 mEq Oral BID Marinda Elk, MD   40 mEq at 08/30/22 1332   pravastatin (PRAVACHOL) tablet 40 mg  40 mg Oral QPM Rai, Ripudeep  K, MD   40 mg at 08/29/22 1712   QUEtiapine (SEROQUEL) tablet 50 mg  50 mg Oral BID Rai, Ripudeep K, MD   50 mg at 08/30/22 0931   warfarin (COUMADIN) tablet 7.5 mg  7.5 mg Oral ONCE-1600 Marinda Elk, MD       Warfarin - Pharmacist Dosing Inpatient   Does not apply q1600 [facility]ast, [facility]   Given at 08/29/22 1534   zonisamide (ZONEGRAN) capsule 100 mg  100 mg Oral Daily Rai, Ripudeep K, MD   100 mg at 08/30/22 0930   zonisamide (ZONEGRAN) capsule 200 mg  200 mg Oral QHS Rai, Ripudeep K, MD   200 mg at 08/29/22 2118     Discharge Medications: Please see discharge summary for a list of discharge medications.  Relevant Imaging Results:  Relevant Lab Results:   Additional Information SSN: [id number]  Otelia Santee, LCSW

## 2022-08-30 NOTE — Progress Notes (Signed)
PT Cancellation Note  Patient Details Name: [name] MRN: [id number] DOB: January 09, 1956   Cancelled Treatment:    Reason Eval/Treat Not Completed: Pain limiting ability to participate;Patient at procedure or test/unavailable. Per RN, pt with abdominal pain, drinking contrast for abdominal CT. Requesting therapy hold until after CT; will continue to follow.   Domenick Bookbinder PT, DPT 08/30/22, 10:34 AM

## 2022-08-31 ENCOUNTER — Encounter: Payer: Self-pay | Admitting: Family Medicine

## 2022-08-31 ENCOUNTER — Telehealth: Payer: 59 | Admitting: Family Medicine

## 2022-08-31 ENCOUNTER — Telehealth: Payer: Self-pay

## 2022-08-31 DIAGNOSIS — R55 Syncope and collapse: Secondary | ICD-10-CM | POA: Diagnosis not present

## 2022-08-31 DIAGNOSIS — K922 Gastrointestinal hemorrhage, unspecified: Secondary | ICD-10-CM

## 2022-08-31 DIAGNOSIS — I5032 Chronic diastolic (congestive) heart failure: Secondary | ICD-10-CM | POA: Diagnosis not present

## 2022-08-31 DIAGNOSIS — I10 Essential (primary) hypertension: Secondary | ICD-10-CM | POA: Diagnosis not present

## 2022-08-31 LAB — CBC WITH DIFFERENTIAL/PLATELET
Abs Immature Granulocytes: 0.18 10*3/uL — ABNORMAL HIGH (ref 0.00–0.07)
Basophils Absolute: 0 10*3/uL (ref 0.0–0.1)
Basophils Relative: 0 %
Eosinophils Absolute: 0.3 10*3/uL (ref 0.0–0.5)
Eosinophils Relative: 3 %
HCT: 22.6 % — ABNORMAL LOW (ref 36.0–46.0)
Hemoglobin: 6.8 g/dL — CL (ref 12.0–15.0)
Immature Granulocytes: 2 %
Lymphocytes Relative: 18 %
Lymphs Abs: 2.1 10*3/uL (ref 0.7–4.0)
MCH: 26.9 pg (ref 26.0–34.0)
MCHC: 30.1 g/dL (ref 30.0–36.0)
MCV: 89.3 fL (ref 80.0–100.0)
Monocytes Absolute: 0.8 10*3/uL (ref 0.1–1.0)
Monocytes Relative: 8 %
Neutro Abs: 7.8 10*3/uL — ABNORMAL HIGH (ref 1.7–7.7)
Neutrophils Relative %: 69 %
Platelets: 344 10*3/uL (ref 150–400)
RBC: 2.53 MIL/uL — ABNORMAL LOW (ref 3.87–5.11)
RDW: 17.7 % — ABNORMAL HIGH (ref 11.5–15.5)
WBC: 11.3 10*3/uL — ABNORMAL HIGH (ref 4.0–10.5)
nRBC: 5.5 % — ABNORMAL HIGH (ref 0.0–0.2)

## 2022-08-31 LAB — CULTURE, BLOOD (ROUTINE X 2)
Special Requests: ADEQUATE
Special Requests: ADEQUATE

## 2022-08-31 LAB — BASIC METABOLIC PANEL
Anion gap: 9 (ref 5–15)
BUN: 20 mg/dL (ref 8–23)
CO2: 25 mmol/L (ref 22–32)
Calcium: 8 mg/dL — ABNORMAL LOW (ref 8.9–10.3)
Chloride: 102 mmol/L (ref 98–111)
Creatinine, Ser: 0.71 mg/dL (ref 0.44–1.00)
GFR, Estimated: >60 mL/min (ref >60)
Glucose, Bld: 168 mg/dL — ABNORMAL HIGH (ref 70–99)
Potassium: 3.9 mmol/L (ref 3.5–5.1)
Sodium: 136 mmol/L (ref 135–145)

## 2022-08-31 LAB — GLUCOSE, CAPILLARY
Glucose-Capillary: 160 mg/dL — ABNORMAL HIGH (ref 70–99)
Glucose-Capillary: 163 mg/dL — ABNORMAL HIGH (ref 70–99)
Glucose-Capillary: 155 mg/dL — ABNORMAL HIGH (ref 70–99)
Glucose-Capillary: 183 mg/dL — ABNORMAL HIGH (ref 70–99)

## 2022-08-31 LAB — BPAM RBC: ISSUE DATE / TIME: 202406132042

## 2022-08-31 LAB — PROTIME-INR
INR: 1.3 — ABNORMAL HIGH (ref 0.8–1.2)
Prothrombin Time: 16.5 seconds — ABNORMAL HIGH (ref 11.4–15.2)

## 2022-08-31 LAB — TYPE AND SCREEN
Donor AG Type: NEGATIVE
Donor AG Type: NEGATIVE

## 2022-08-31 LAB — PREPARE RBC (CROSSMATCH)

## 2022-08-31 MED ORDER — SODIUM CHLORIDE 0.9% IV SOLUTION: Freq: Once | INTRAVENOUS | Status: AC

## 2022-08-31 NOTE — Progress Notes (Signed)
Pt on provider's schedule for VV.  Pt called. Currently hospitalized.  Wanted her PCP to know what was going on with her.  Admitted 08/23/22 through present for dizziness/near syncope  Pt states she is not feeling great, no better, no worse.  Getting a new IV placed.  States needs another blood transfusion, Hgb 6.8.  Pt a little frustrated as they have not been able to find the source of the bleeding.  Dx'd with pna today.  Sounds like daughter Karoline Caldwell is at bedside.    Pt encouraged.  Support offered.  Advised will continue to follow her progress.    [name], MD

## 2022-08-31 NOTE — TOC Progression Note (Signed)
Transition of Care [facility]) - Progression Note    Patient Details  Name: [name] MRN: [id number] Date of Birth: 10-12-1955  Transition of Care [facility]) CM/SW Contact  Otelia Santee, LCSW Phone Number: 08/31/2022, 10:46 AM  Clinical Narrative:    Referrals have been sent out for SNF placement. Currently awaiting bed offers.   Expected Discharge Plan: Home/Self Care    Expected Discharge Plan and Services                                               Social Determinants of Health (SDOH) Interventions SDOH Screenings   Food Insecurity: No Food Insecurity (08/23/2022)  Housing: Low Risk  (08/23/2022)  Transportation Needs: No Transportation Needs (08/23/2022)  Utilities: Not At Risk (08/23/2022)  Alcohol Screen: Low Risk  (03/03/2022)  Depression (PHQ2-9): Low Risk  (06/22/2022)  Recent Concern: Depression (PHQ2-9) - Medium Risk (03/27/2022)  Financial Resource Strain: Low Risk  (03/03/2022)  Physical Activity: Insufficiently Active (03/03/2022)  Social Connections: Moderately Integrated (03/03/2022)  Stress: No Stress Concern Present (03/03/2022)  Tobacco Use: Low Risk  (08/30/2022)    Readmission Risk Interventions     No data to display

## 2022-08-31 NOTE — Progress Notes (Signed)
Physical Therapy Treatment Patient Details Name: [name] MRN: [id number] DOB: 1955/10/06 Today's Date: 08/31/2022   History of Present Illness Pt is an 67 y/o F admitted on 08/23/23 after presenting with c/o dizziness & L knee pain. Pt is being treated for near syncope. Pt s/p upper GI endoscopy 6/7 and received PRBCs 6/7. PMH: DM2, COPD, DVTs on coumadin, clotting disorder, GERD, HTN, HLP, OSA, peripheral neuropathy, heart murmr    PT Comments    Pt tired, noted hgb 6.8. Pt agreeable to bed exercises, educated on muscle activation, isometric hold, AROM as much as able. Pt with increase in L knee pain from 0 to 5 with exercises. Pt emotional regarding hospitalization and wanting to return home, offered listening ear and educated pt on chaplain which pt was in agreement with speaking with someone- notified nurse of chaplain consult. Progress limited due to abdominal pain, L knee pain and medical complications. Will continue to progress as able.   Recommendations for follow up therapy are one component of a multi-disciplinary discharge planning process, led by the attending physician.  Recommendations may be updated based on patient status, additional functional criteria and insurance authorization.  Follow Up Recommendations  Can patient physically be transported by private vehicle: No    Assistance Recommended at Discharge Frequent or constant Supervision/Assistance  Patient can return home with the following A little help with walking and/or transfers;A little help with bathing/dressing/bathroom;Assistance with cooking/housework;Assist for transportation   Equipment Recommendations  None recommended by PT    Recommendations for Other Services       Precautions / Restrictions Precautions Precautions: Fall Restrictions Weight Bearing Restrictions: No     Mobility  Bed Mobility                    Transfers                        Ambulation/Gait                    Stairs             Wheelchair Mobility    Modified Rankin (Stroke Patients Only)       Balance                                            Cognition Arousal/Alertness: Awake/alert Behavior During Therapy: WFL for tasks assessed/performed Overall Cognitive Status: Within Functional Limits for tasks assessed                                 General Comments: motivated to get home, processing hospitalization and wanting to speak with chaplain        Exercises General Exercises - Lower Extremity Ankle Circles/Pumps: AROM, Both, 20 reps, Supine Quad Sets: AROM, Strengthening, Both, 10 reps, Supine Short Arc Quad: AROM, Strengthening, Left, 10 reps, Supine Heel Slides: AROM, Both, 5 reps, Supine    General Comments        Pertinent Vitals/Pain Pain Assessment Pain Assessment: 0-10 Pain Location: 0/10 at rest, 5/10 with exercises L knee; 6/10 abdomen Pain Descriptors / Indicators: Aching, Sore ("stiff") Pain Intervention(s): Limited activity within patient's tolerance, Monitored during session, Repositioned    Home Living  Prior Function            PT Goals (current goals can now be found in the care plan section) Acute Rehab PT Goals Patient Stated [place]: get better, return home PT [place] Formulation: With patient Time For [place] Achievement: 09/07/22 Potential to Achieve Goals: Good Progress towards PT goals: Progressing toward goals (limited by pain, low hgb)    Frequency    Min 3X/week      PT Plan Current plan remains appropriate    Co-evaluation              AM-PAC PT "6 Clicks" Mobility   Outcome Measure  Help needed turning from your back to your side while in a flat bed without using bedrails?: A Little Help needed moving from lying on your back to sitting on the side of a flat bed without using bedrails?: A Little Help needed moving to and from a  bed to a chair (including a wheelchair)?: A Little Help needed standing up from a chair using your arms (e.g., wheelchair or bedside chair)?: A Little Help needed to walk in hospital room?: Total Help needed climbing 3-5 steps with a railing? : Total 6 Click Score: 14    End of Session   Activity Tolerance: Patient tolerated treatment well;Patient limited by fatigue Patient left: in bed;with call bell/phone within reach;with bed alarm set Nurse Communication: Mobility status PT Visit Diagnosis: Pain;Muscle weakness (generalized) (M62.81);Other abnormalities of gait and mobility (R26.89);Difficulty in walking, not elsewhere classified (R26.2) Pain - Right/Left: Left Pain - part of body: Knee     Time: 1610-9604 PT Time Calculation (min) (ACUTE ONLY): 18 min  Charges:  $Therapeutic Exercise: 8-22 mins                      Domenick Bookbinder PT, DPT 08/31/22, 4:29 PM

## 2022-08-31 NOTE — Progress Notes (Signed)
[facility] PROGRESS NOTE    Progress Note  [name]  ZOX:[id number] DOB: Jan 18, 1956 DOA: 08/23/2022 PCP: [name], MD     Brief Narrative:   [name] is an 67 y.o. female past medical history of diabetes mellitus type 2, COPD history of DVT on Coumadin, peripheral neuropathy comes in with the ED with dizziness that started the day prior to admission.  She also been having left knee pain in the ED labs were unremarkable INR 7.0 FOBT positive and a hemoglobin of 9   Assessment/Plan:   Near syncope: Antihypertensive medications were held on admission she was on Cardizem and Lasix Aldactone and clonidine. Likely due to GI bleed. 2D echo showed an EF of 65% grade 1 diastolic dysfunction.  Normocytic anemia/question GI bleed: FOBT positive with a supratherapeutic INR. Baseline hemoglobin like around 11 worsened to 6.1 status post 2 units packed red blood cells. GI was consulted EGD done on 08/25/2022 that showed gastric polyps single bleeding angiectasia. CT scan of the abdomen pelvis showed no acute findings. Appreciate GI assistance, hemoglobin has stabilized 7.2.  Essential hypertension: Antihypertensive medications were held on admission clonidine, Lasix, Cardizem and Aldactone. Blood pressure seems to be stable continue to hold antihypertensive medication.  History of DVT on Coumadin/supratherapeutic INR: On admission 1.7 vitamin K given, now 1.2. GI to dictate when to start anticoagulation.  Acute on chronic left knee pain/fibromyalgia: Prior history of left knee arthroplasty imaging showed no acute findings. CRP of 2.4. Follow-up with orthopedic as an outpatient.  Chronic diastolic heart failure: Appears euvolemic.  Hypokalemia: KCL tab, recheck  Mixed lipidemia:  continue statins.  Morbid obesity: Noted.  She has been counseled.  Leukocytosis probably due to community-acquired pneumonia: White blood cell count is improving.   She has remained afebrile. CT of the abdomen pelvis showed left lower lobe opacity suspicious for pneumonia. Started empirically on IV Rocephin and azithromycin as remain afebrile. ESR is elevated. Blood cultures have been negative till date from 08/26/2022   DVT prophylaxis: SCd Family Communication:sister Status is: Inpatient Remains inpatient appropriate because: Acute GI bleed    Code Status:     Code Status Orders  (From admission, onward)           Start     Ordered   08/23/22 1503  Full code  Continuous       Question:  By:  Answer:  Consent: discussion documented in EHR   08/23/22 1503           Code Status History     Date Active Date Inactive Code Status Order ID Comments User Context   02/28/2021 0839 02/28/2021 1859 Full Code [id number]  Kathleene Hazel, MD Inpatient   03/09/2019 2233 03/16/2019 2113 Full Code [id number]  John Giovanni, MD ED   02/20/2019 0456 02/20/2019 1911 Full Code [id number]  Coletta Memos, MD ED   02/27/2017 1318 02/28/2017 1350 Full Code [id number]  Harriette Bouillon, MD Inpatient   06/12/2014 1948 06/14/2014 1723 Full Code [id number]  Pearson Grippe, MD Inpatient   04/24/2014 2234 04/30/2014 2027 Full Code [id number]  Drema Dallas, MD Inpatient   08/17/2013 1240 08/21/2013 2134 Full Code [id number]  Kathlen Mody, MD Inpatient         IV Access:   Peripheral IV   Procedures and diagnostic studies:   CT ABDOMEN PELVIS W CONTRAST  Result Date: 08/30/2022 CLINICAL DATA:  Flank pain. EXAM: CT ABDOMEN AND PELVIS WITH CONTRAST TECHNIQUE: Multidetector CT imaging of the  abdomen and pelvis was performed using the standard protocol following bolus administration of intravenous contrast. RADIATION DOSE REDUCTION: This exam was performed according to the departmental dose-optimization program which includes automated exposure control, adjustment of the mA and/or kV according to patient size and/or use of iterative reconstruction technique.  CONTRAST:  OMNIPAQUE IOHEXOL 300 MG/ML  SOLN COMPARISON:  Noncontrast CT on 08/27/2022 FINDINGS: Lower Chest: Increased airspace opacity in left lower lung, suspicious for pneumonia. Hepatobiliary: A few tiny sub-cm low attention lesions in the left hepatic lobe are too small to characterize, but remains stable and most likely represent tiny cysts. No suspicious hepatic masses identified. Prior cholecystectomy. No evidence of biliary obstruction. Pancreas:  No mass or inflammatory changes. Spleen: Within normal limits in size and appearance. Adrenals/Urinary Tract: Stable 2.8 cm right adrenal mass, consistent with benign adenoma (No followup imaging is recommended). No suspicious renal masses identified. No evidence of ureteral calculi or hydronephrosis. Unremarkable unopacified urinary bladder. Stomach/Bowel: No evidence of obstruction, inflammatory process or abnormal fluid collections. Normal appendix visualized. Vascular/Lymphatic: No pathologically enlarged lymph nodes. No acute vascular findings. Reproductive: Prior hysterectomy noted. Adnexal regions are unremarkable in appearance. Other:  None. Musculoskeletal:  No suspicious bone lesions identified. IMPRESSION: No acute findings within the abdomen or pelvis. No evidence of urolithiasis or hydronephrosis. Increased airspace opacity in left lower lung, suspicious for pneumonia. Electronically Signed   By: Danae Orleans M.D.   On: 08/30/2022 15:11     Medical Consultants:   None.   Subjective:    Emelda Fear Erlich related to nominal pain is improved appetite has returned. Objective:    Vitals:   08/30/22 1318 08/30/22 1924 08/31/22 0527 08/31/22 0803  BP: (!) 145/70 (!) 142/88 126/69   Pulse: 79 77 86   Resp: 14 16 16    Temp: 97.8 F (36.6 C) 98 F (36.7 C) 98 F (36.7 C)   TempSrc: Oral Oral Oral   SpO2: 95% 95% 98% 95%  Weight:      Height:       SpO2: 95 % O2 Flow Rate (L/min): 3 L/min FiO2 (%): 21 %   Intake/Output  Summary (Last 24 hours) at 08/31/2022 0816 Last data filed at 08/30/2022 1717 Gross per 24 hour  Intake 0 ml  Output --  Net 0 ml   Filed Weights   08/23/22 2000  Weight: 127.9 kg    Exam: General exam: In no acute distress. Respiratory system: Good air movement and clear to auscultation. Cardiovascular system: S1 & S2 heard, RRR. No JVD. Gastrointestinal system: Abdomen is nondistended, soft and nontender.  Extremities: No pedal edema. Skin: No rashes, lesions or ulcers Psychiatry: Judgement and insight appear normal. Mood & affect appropriate. Data Reviewed:    Labs: Basic Metabolic Panel: Recent Labs  Lab 08/27/22 0550 08/28/22 0625 08/29/22 0552 08/30/22 0544 08/30/22 1018  NA 138 137 138 133* 137  K 3.4* 4.0 3.7 3.1* 3.2*  CL 104 101 101 98 101  CO2 24 26 28 23 26   GLUCOSE 169* 189* 158* 160* 151*  BUN 28* 22 19 17 16   CREATININE 0.74 0.85 0.82 0.79 0.94  CALCIUM 8.0* 8.0* 8.1* 7.9* 8.1*    GFR Estimated Creatinine Clearance: 79.5 mL/min (by C-G formula based on SCr of 0.94 mg/dL). Liver Function Tests: Recent Labs  Lab 08/30/22 1018  AST 18  ALT 24  ALKPHOS 75  BILITOT 0.4  PROT 7.2  ALBUMIN 3.1*    No results for input(s): "LIPASE", "AMYLASE" in the  last 168 hours. No results for input(s): "AMMONIA" in the last 168 hours. Coagulation profile Recent Labs  Lab 08/27/22 1552 08/28/22 0625 08/29/22 0552 08/30/22 1130 08/31/22 0550  INR 1.5* 1.3* 1.2 1.2 1.3*    COVID-19 Labs  No results for input(s): "DDIMER", "FERRITIN", "LDH", "CRP" in the last 72 hours.  Lab Results  Component Value Date   SARSCOV2NAA NEGATIVE 08/08/2019   SARSCOV2NAA Not Detected 04/04/2019   SARSCOV2NAA POSITIVE (A) 03/09/2019    CBC: Recent Labs  Lab 08/26/22 0500 08/27/22 0550 08/28/22 0625 08/29/22 0552 08/30/22 0544  WBC 27.3* 17.1* 15.4* 14.1* 12.3*  HGB 7.8* 7.5* 7.0* 7.2* 7.3*  HCT 24.4* 23.6* 22.2* 23.5* 23.7*  MCV 86.8 88.4 88.1 89.0 88.4  PLT  260 266 297 328 326    Cardiac Enzymes: No results for input(s): "CKTOTAL", "CKMB", "CKMBINDEX", "TROPONINI" in the last 168 hours. BNP (last 3 results) No results for input(s): "PROBNP" in the last 8760 hours. CBG: Recent Labs  Lab 08/30/22 0719 08/30/22 1117 08/30/22 1606 08/30/22 2158 08/31/22 0716  GLUCAP 141* 133* 165* 151* 160*    D-Dimer: No results for input(s): "DDIMER" in the last 72 hours. Hgb A1c: No results for input(s): "HGBA1C" in the last 72 hours. Lipid Profile: No results for input(s): "CHOL", "HDL", "LDLCALC", "TRIG", "CHOLHDL", "LDLDIRECT" in the last 72 hours. Thyroid function studies: No results for input(s): "TSH", "T4TOTAL", "T3FREE", "THYROIDAB" in the last 72 hours.  Invalid input(s): "FREET3" Anemia work up: No results for input(s): "VITAMINB12", "FOLATE", "FERRITIN", "TIBC", "IRON", "RETICCTPCT" in the last 72 hours. Sepsis Labs: Recent Labs  Lab 08/26/22 1411 08/27/22 0550 08/28/22 0625 08/29/22 0552 08/30/22 0544 08/30/22 1619  PROCALCITON 0.18  --   --   --   --  <0.10  WBC  --  17.1* 15.4* 14.1* 12.3*  --     Microbiology Recent Results (from the past 240 hour(s))  Culture, blood (Routine X 2) w Reflex to ID Panel     Status: None (Preliminary result)   Collection Time: 08/26/22  2:11 PM   Specimen: BLOOD  Result Value Ref Range Status   Specimen Description   Final    BLOOD BLOOD RIGHT ARM Performed at [facility], [address] [place], [place] [address]    Special Requests   Final    BOTTLES DRAWN AEROBIC AND ANAEROBIC Blood Culture adequate volume Performed at [facility], [address] [place], [place] [address]    Culture   Final    NO GROWTH 4 DAYS Performed at [facility], [address] [place], [place] [address]    Report Status PENDING  Incomplete  Culture, blood (Routine X 2) w Reflex to ID Panel     Status: None (Preliminary result)   Collection Time:  08/26/22  2:16 PM   Specimen: BLOOD  Result Value Ref Range Status   Specimen Description   Final    BLOOD BLOOD LEFT ARM Performed at [facility], [address], [place], [place] [address]    Special Requests   Final    BOTTLES DRAWN AEROBIC AND ANAEROBIC Blood Culture adequate volume Performed at [facility], [address] [place], [place] [address]    Culture   Final    NO GROWTH 4 DAYS Performed at [facility], [address] [place], [place] [address]    Report Status PENDING  Incomplete     Medications:    sodium chloride  Intravenous Once   cloNIDine  0.1 mg Oral BID   diltiazem  240 mg Oral QAC breakfast   DULoxetine  90 mg Oral QHS   fluticasone  2 spray Each Nare BID   furosemide  80 mg Oral Daily   gabapentin  300 mg Oral TID   insulin aspart  0-15 Units Subcutaneous TID WC   insulin aspart  0-5 Units Subcutaneous QHS   insulin glargine-yfgn  10 Units Subcutaneous QHS   lidocaine  1 patch Transdermal Q24H   linaclotide  145 mcg Oral QAC breakfast   methocarbamol  500 mg Oral TID   mometasone-formoterol  2 puff Inhalation BID   pantoprazole  40 mg Oral BID AC   potassium chloride  40 mEq Oral BID   pravastatin  40 mg Oral QPM   QUEtiapine  50 mg Oral BID   Warfarin - Pharmacist Dosing Inpatient   Does not apply q1600   zonisamide  100 mg Oral Daily   zonisamide  200 mg Oral QHS   Continuous Infusions:  azithromycin 500 mg (08/30/22 1717)   cefTRIAXone (ROCEPHIN)  IV 1 g (08/30/22 1951)      LOS: 7 days   Marinda Elk  [facility]  08/31/2022, 8:16 AM

## 2022-08-31 NOTE — Progress Notes (Signed)
   08/31/22 0100  BiPAP/CPAP/SIPAP  $ Non-Invasive Home Ventilator  Subsequent  BiPAP/CPAP/SIPAP Pt Type Adult  BiPAP/CPAP/SIPAP Resmed  Mask Type Full face mask  Mask Size Large  Set Rate 0 breaths/min  Respiratory Rate 18 breaths/min  FiO2 (%) 21 %  Patient Home Equipment No  Auto Titrate Yes (min-6 max-16)

## 2022-08-31 NOTE — Telephone Encounter (Signed)
[name] made a request for the provider to review her lab results during a phone call. I informed the patient that I would relay her message to the provider, noting that she is currently hospitalized.

## 2022-08-31 NOTE — Care Management Important Message (Signed)
Important Message  Patient Details IM Letter given Name: [name] MRN: [id number] Date of Birth: 1956-02-27   Medicare Important Message Given:  Yes     Caren Macadam 08/31/2022, 1:13 PM

## 2022-08-31 NOTE — Progress Notes (Signed)
   08/31/22 2312  BiPAP/CPAP/SIPAP  BiPAP/CPAP/SIPAP Pt Type Adult  BiPAP/CPAP/SIPAP Resmed  Mask Type Full face mask  Mask Size Large  Set Rate 0 breaths/min  Respiratory Rate 18 breaths/min  FiO2 (%) 21 %  Flow Rate 0 lpm  Patient Home Equipment No  Auto Titrate Yes (min-6 max-16)

## 2022-09-01 ENCOUNTER — Other Ambulatory Visit: Payer: Self-pay | Admitting: *Deleted

## 2022-09-01 DIAGNOSIS — Z741 Need for assistance with personal care: Secondary | ICD-10-CM | POA: Diagnosis not present

## 2022-09-01 DIAGNOSIS — Z7401 Bed confinement status: Secondary | ICD-10-CM | POA: Diagnosis not present

## 2022-09-01 DIAGNOSIS — I509 Heart failure, unspecified: Secondary | ICD-10-CM | POA: Diagnosis not present

## 2022-09-01 DIAGNOSIS — R531 Weakness: Secondary | ICD-10-CM | POA: Diagnosis not present

## 2022-09-01 DIAGNOSIS — Z79899 Other long term (current) drug therapy: Secondary | ICD-10-CM | POA: Diagnosis not present

## 2022-09-01 DIAGNOSIS — Z7984 Long term (current) use of oral hypoglycemic drugs: Secondary | ICD-10-CM | POA: Diagnosis not present

## 2022-09-01 DIAGNOSIS — I10 Essential (primary) hypertension: Secondary | ICD-10-CM | POA: Diagnosis not present

## 2022-09-01 DIAGNOSIS — Z743 Need for continuous supervision: Secondary | ICD-10-CM | POA: Diagnosis not present

## 2022-09-01 DIAGNOSIS — J189 Pneumonia, unspecified organism: Secondary | ICD-10-CM

## 2022-09-01 DIAGNOSIS — M6259 Muscle wasting and atrophy, not elsewhere classified, multiple sites: Secondary | ICD-10-CM | POA: Diagnosis not present

## 2022-09-01 DIAGNOSIS — R1031 Right lower quadrant pain: Secondary | ICD-10-CM | POA: Diagnosis not present

## 2022-09-01 DIAGNOSIS — K769 Liver disease, unspecified: Secondary | ICD-10-CM | POA: Diagnosis not present

## 2022-09-01 DIAGNOSIS — R300 Dysuria: Secondary | ICD-10-CM | POA: Diagnosis not present

## 2022-09-01 DIAGNOSIS — Z7901 Long term (current) use of anticoagulants: Secondary | ICD-10-CM | POA: Diagnosis not present

## 2022-09-01 DIAGNOSIS — E876 Hypokalemia: Secondary | ICD-10-CM | POA: Diagnosis not present

## 2022-09-01 DIAGNOSIS — D649 Anemia, unspecified: Secondary | ICD-10-CM

## 2022-09-01 DIAGNOSIS — R109 Unspecified abdominal pain: Secondary | ICD-10-CM | POA: Diagnosis not present

## 2022-09-01 DIAGNOSIS — E119 Type 2 diabetes mellitus without complications: Secondary | ICD-10-CM | POA: Diagnosis not present

## 2022-09-01 DIAGNOSIS — R404 Transient alteration of awareness: Secondary | ICD-10-CM | POA: Diagnosis not present

## 2022-09-01 DIAGNOSIS — D72829 Elevated white blood cell count, unspecified: Secondary | ICD-10-CM | POA: Diagnosis not present

## 2022-09-01 DIAGNOSIS — I110 Hypertensive heart disease with heart failure: Secondary | ICD-10-CM | POA: Diagnosis not present

## 2022-09-01 DIAGNOSIS — R55 Syncope and collapse: Secondary | ICD-10-CM | POA: Diagnosis not present

## 2022-09-01 DIAGNOSIS — I5032 Chronic diastolic (congestive) heart failure: Secondary | ICD-10-CM | POA: Diagnosis not present

## 2022-09-01 DIAGNOSIS — M6281 Muscle weakness (generalized): Secondary | ICD-10-CM | POA: Diagnosis not present

## 2022-09-01 DIAGNOSIS — K922 Gastrointestinal hemorrhage, unspecified: Secondary | ICD-10-CM | POA: Diagnosis not present

## 2022-09-01 LAB — TYPE AND SCREEN
ABO/RH(D): A POS
Antibody Screen: POSITIVE
Unit division: 0
Unit division: 0

## 2022-09-01 LAB — GLUCOSE, CAPILLARY
Glucose-Capillary: 158 mg/dL — ABNORMAL HIGH (ref 70–99)
Glucose-Capillary: 158 mg/dL — ABNORMAL HIGH (ref 70–99)

## 2022-09-01 LAB — BPAM RBC
Blood Product Expiration Date: 202406252359
Blood Product Expiration Date: 202406282359
ISSUE DATE / TIME: 202406131744
Unit Type and Rh: 600
Unit Type and Rh: 6200

## 2022-09-01 LAB — HEMOGLOBIN AND HEMATOCRIT, BLOOD
HCT: 28.9 % — ABNORMAL LOW (ref 36.0–46.0)
Hemoglobin: 9.3 g/dL — ABNORMAL LOW (ref 12.0–15.0)

## 2022-09-01 MED ORDER — CEFDINIR 300 MG PO CAPS
300.0000 mg | ORAL_CAPSULE | Freq: Two times a day (BID) | ORAL | Status: DC
  Administered 2022-09-01: 300 mg via ORAL
  Filled 2022-09-01: qty 1

## 2022-09-01 MED ORDER — CEFDINIR 300 MG PO CAPS: 300.0000 mg | ORAL_CAPSULE | Freq: Two times a day (BID) | ORAL | 0 refills | Status: AC

## 2022-09-01 MED ORDER — WARFARIN SODIUM 7.5 MG PO TABS: ORAL_TABLET | ORAL | 2 refills | Status: DC

## 2022-09-01 MED ORDER — AZITHROMYCIN 250 MG PO TABS
500.0000 mg | ORAL_TABLET | Freq: Every day | ORAL | Status: DC
  Administered 2022-09-01: 500 mg via ORAL
  Filled 2022-09-01: qty 2

## 2022-09-01 MED ORDER — OXYCODONE HCL 5 MG PO TABS: 5.0000 mg | ORAL_TABLET | ORAL | 0 refills | Status: DC | PRN

## 2022-09-01 MED ORDER — AZITHROMYCIN 250 MG PO TABS: ORAL_TABLET | ORAL | 0 refills | Status: DC

## 2022-09-01 NOTE — Progress Notes (Signed)
Physical Therapy Treatment Patient Details Name: Brione Bothun Pienta MRN: [id number] DOB: Sep 17, 1955 Today's Date: 09/01/2022   History of Present Illness Pt is an 67 y/o F admitted on 08/23/23 after presenting with c/o dizziness & L knee pain. Pt is being treated for near syncope. Pt s/p upper GI endoscopy 6/7 and received PRBCs 6/7. PMH: DM2, COPD, DVTs on coumadin, clotting disorder, GERD, HTN, HLP, OSA, peripheral neuropathy, heart murmr    PT Comments    Pt admitted with above diagnosis.  Pt currently with functional limitations due to the deficits listed below (see PT Problem List). Pt in bed when PT arrived. Pt agreeable to therapy intervention. Pt is s/p transfusion of 4 PRBC this am. Pt sates she is feeling better, still sleepy and concerned per how her blood got that "low."  Pt performed supine to sit with use of hospital bed and min guard, STS from EOB to RW with cues and min guard, pt demonstrated good progression with gait tasks today 32 feet with RW, min guard and recliner close. Pt indicated mild dizziness with gait tasks. Pt left seated in recliner all needs in place. PT anticipates d/c today to SNF. Pt will benefit from acute skilled PT to increase their independence and safety with mobility to allow discharge.      Recommendations for follow up therapy are one component of a multi-disciplinary discharge planning process, led by the attending physician.  Recommendations may be updated based on patient status, additional functional criteria and insurance authorization.  Follow Up Recommendations  Can patient physically be transported by private vehicle: No    Assistance Recommended at Discharge Frequent or constant Supervision/Assistance  Patient can return home with the following A little help with walking and/or transfers;A little help with bathing/dressing/bathroom;Assistance with cooking/housework;Assist for transportation   Equipment Recommendations  None recommended by PT     Recommendations for Other Services       Precautions / Restrictions Precautions Precautions: Fall Precaution Comments: watch BP Restrictions Weight Bearing Restrictions: No     Mobility  Bed Mobility Overal bed mobility: Needs Assistance Bed Mobility: Supine to Sit     Supine to sit: Min guard     General bed mobility comments: cues and use of hospital bed    Transfers Overall transfer level: Needs assistance Equipment used: Rolling walker (2 wheels) Transfers: Sit to/from Stand Sit to Stand: Min guard           General transfer comment: verbal cues for hand placement,  from ;elevated EOB    Ambulation/Gait Ambulation/Gait assistance: Min guard (recliner close) Gait Distance (Feet): 32 Feet Assistive device: Rolling walker (2 wheels) Gait Pattern/deviations: Step-to pattern, Decreased weight shift to left, Shuffle, Wide base of support Gait velocity: decrease         Stairs             Wheelchair Mobility    Modified Rankin (Stroke Patients Only)       Balance Overall balance assessment: Needs assistance Sitting-balance support: Feet supported Sitting balance-Leahy Scale: Good     Standing balance support: During functional activity, Reliant on assistive device for balance, Bilateral upper extremity supported Standing balance-Leahy Scale: Poor                              Cognition Arousal/Alertness: Awake/alert Behavior During Therapy: WFL for tasks assessed/performed Overall Cognitive Status: Within Functional Limits for tasks assessed  General Comments: motivated to get home, processing hospitalization and wanting to speak with chaplain        Exercises      General Comments General comments (skin integrity, edema, etc.): c/o of dizziness and nursing staff aware with hx of orthostatic hypotension and O2 saturation 92% on RA with exertion      Pertinent Vitals/Pain  Pain Assessment Pain Assessment: No/denies pain (no reports of L knee pain or other pain)    Home Living                          Prior Function            PT Goals (current goals can now be found in the care plan section) Acute Rehab PT Goals Patient Stated Mccolgan: get better, return home PT [place] Formulation: With patient Time For Larkey Achievement: 09/07/22 Potential to Achieve Goals: Good Progress towards PT goals: Progressing toward goals    Frequency    Min 3X/week      PT Plan Current plan remains appropriate    Co-evaluation              AM-PAC PT "6 Clicks" Mobility   Outcome Measure  Help needed turning from your back to your side while in a flat bed without using bedrails?: A Little Help needed moving from lying on your back to sitting on the side of a flat bed without using bedrails?: A Little Help needed moving to and from a bed to a chair (including a wheelchair)?: A Little Help needed standing up from a chair using your arms (e.g., wheelchair or bedside chair)?: A Little Help needed to walk in hospital room?: A Little Help needed climbing 3-5 steps with a railing? : Total 6 Click Score: 16    End of Session Equipment Utilized During Treatment: Gait belt Activity Tolerance: Patient tolerated treatment well;Patient limited by fatigue Patient left: with call bell/phone within reach;with chair alarm set Nurse Communication: Mobility status PT Visit Diagnosis: Muscle weakness (generalized) (M62.81);Unsteadiness on feet (R26.81);Other abnormalities of gait and mobility (R26.89)     Time: 1191-4782 PT Time Calculation (min) (ACUTE ONLY): 17 min  Charges:  $Gait Training: 8-22 mins                     Johnny Bridge, PT Acute Rehab    Jacqualyn Posey 09/01/2022, 12:01 PM

## 2022-09-01 NOTE — Progress Notes (Addendum)
ANTICOAGULATION CONSULT NOTE - Follow Up Consult  Pharmacy Consult for warfarin Indication: DVT  Allergies  Allergen Reactions   Lisinopril Anaphylaxis, Swelling and Other (See Comments)    THROAT SWELLING  Pt states her throat started to "close up" after being on it for "so long."  THROAT SWELLING, Pt states her throat started to "close up" after being on it for "so long."   Prednisone Anaphylaxis, Swelling and Other (See Comments)    Throat swelling and tongue irritation   Spironolactone Other (See Comments)    Caused stroke-like symptoms with the mouth   Corticosteroids Rash    States she developed a rash on her tongue after a steroid injection in her back    Patient Measurements: Height: 5\' 6"  (167.6 cm) Weight: 127.9 kg (281 lb 15.5 oz) IBW/kg (Calculated) : 59.3 Heparin Dosing Weight:   Vital Signs: Temp: 97.9 F (36.6 C) (06/14 0536) Temp Source: Oral (06/14 0536) BP: 158/83 (06/14 0536) Pulse Rate: 79 (06/14 0536)  Labs: Recent Labs    08/30/22 0544 08/30/22 1018 08/30/22 1130 08/31/22 0546 08/31/22 0550 09/01/22 0644  HGB 7.3*  --   --  6.8*  --  9.3*  HCT 23.7*  --   --  22.6*  --  28.9*  PLT 326  --   --  344  --   --   LABPROT  --   --  15.6*  --  16.5*  --   INR  --   --  1.2  --  1.3*  --   CREATININE 0.79 0.94  --  0.71  --   --      Estimated Creatinine Clearance: 93.4 mL/min (by C-G formula based on SCr of 0.71 mg/dL).   Medications:  Scheduled:   azithromycin  500 mg Oral Daily   cefdinir  300 mg Oral Q12H   cloNIDine  0.1 mg Oral BID   diltiazem  240 mg Oral QAC breakfast   DULoxetine  90 mg Oral QHS   fluticasone  2 spray Each Nare BID   furosemide  80 mg Oral Daily   gabapentin  300 mg Oral TID   insulin aspart  0-15 Units Subcutaneous TID WC   insulin aspart  0-5 Units Subcutaneous QHS   insulin glargine-yfgn  10 Units Subcutaneous QHS   lidocaine  1 patch Transdermal Q24H   linaclotide  145 mcg Oral QAC breakfast    methocarbamol  500 mg Oral TID   mometasone-formoterol  2 puff Inhalation BID   pantoprazole  40 mg Oral BID AC   pravastatin  40 mg Oral QPM   QUEtiapine  50 mg Oral BID   zonisamide  100 mg Oral Daily   zonisamide  200 mg Oral QHS   Infusions:   Assessment: 69 yoF presented on 6/5 for dizziness, near syncope, and left knee pain.   PMH includes DVTs, Lupus anticoagulant clotting disorder on chronic warfarin.  Anticoagulation was held on admission for supratherapeutic INR 7, and possible GI bleed.  Underwent EGD and clips placed 6/7.  Pharmacy is consulted to resume warfarin dosing.  PTA warfarin: 10 mg daily except 7.5 mg MWF (LD 6/4).  Admit INR 7  Significant events: 6/5: Vitamin K 5 mg PO 6/6: Vitamin K 2.5 mg SQ 6/7: 2 units PRBC, upper GI endoscopy 6/10 warfarin resumed  Today, 09/01/2022: INR 1.2, subtherapeutic  CBC:  Hgb slightly improved to 7.3, Plt WNL  No bleeding or complications reported.   Diet:  carb modified  No major drug-drug interactions noted.   Caputi of Therapy:  INR 2-3 Monitor platelets by anticoagulation protocol: Yes   Plan:  Note patient to be discharged today. Would recommend reducing warfarin to at least 7.5mg  daily until outpatient follow up   Hessie Knows, PharmD, BCPS Secure Chat if ?s 09/01/2022 9:42 AM

## 2022-09-01 NOTE — Plan of Care (Signed)
  Problem: Education: Pickard: Knowledge of General Education information will improve Description: Including pain rating scale, medication(s)/side effects and non-pharmacologic comfort measures Outcome: Progressing   Problem: Health Behavior/Discharge Planning: Dicesare: Ability to manage health-related needs will improve Outcome: Progressing   Problem: Clinical Measurements: Cooler: Ability to maintain clinical measurements within normal limits will improve Outcome: Progressing Verrill: Will remain free from infection Outcome: Progressing Denicola: Diagnostic test results will improve Outcome: Progressing Brunton: Respiratory complications will improve Outcome: Progressing Vaden: Cardiovascular complication will be avoided Outcome: Progressing   Problem: Activity: Jabs: Risk for activity intolerance will decrease Outcome: Progressing   Problem: Nutrition: Tamura: Adequate nutrition will be maintained Outcome: Progressing   Problem: Coping: Keng: Level of anxiety will decrease Outcome: Progressing   Problem: Elimination: Mates: Will not experience complications related to bowel motility Outcome: Progressing Fitz: Will not experience complications related to urinary retention Outcome: Progressing   Problem: Pain Managment: Hitchens: General experience of comfort will improve Outcome: Progressing   Problem: Safety: Husband: Ability to remain free from injury will improve Outcome: Progressing   Problem: Skin Integrity: Burdin: Risk for impaired skin integrity will decrease Outcome: Progressing   Problem: Education: Herbers: Ability to describe self-care measures that may prevent or decrease complications (Diabetes Survival Skills Education) will improve Outcome: Progressing Sian: Individualized Educational Video(s) Outcome: Progressing   Problem: Coping: Gorter: Ability to adjust to condition or change in health will improve Outcome: Progressing   Problem: Fluid Volume: Sylvester: Ability to  maintain a balanced intake and output will improve Outcome: Progressing   Problem: Health Behavior/Discharge Planning: Noblet: Ability to identify and utilize available resources and services will improve Outcome: Progressing Zimny: Ability to manage health-related needs will improve Outcome: Progressing   Problem: Metabolic: Cassetta: Ability to maintain appropriate glucose levels will improve Outcome: Progressing   Problem: Nutritional: Hams: Maintenance of adequate nutrition will improve Outcome: Progressing Meland: Progress toward achieving an optimal weight will improve Outcome: Progressing   Problem: Skin Integrity: Brazell: Risk for impaired skin integrity will decrease Outcome: Progressing   Problem: Tissue Perfusion: Hence: Adequacy of tissue perfusion will improve Outcome: Progressing   

## 2022-09-01 NOTE — TOC Transition Note (Addendum)
Transition of Care [facility]) - CM/SW Discharge Note   Patient Details  Name: [name] MRN: [id number] Date of Birth: Apr 08, 1955  Transition of Care [facility]) CM/SW Contact:  Otelia Santee, LCSW Phone Number: 09/01/2022, 11:02 AM   Clinical Narrative:    Met with pt to review bed offers. Pt accepted offer for [facility] if private room available. Confirmed [facility] has private room and able to accept pt after 3pm today. Insurance auth approved. Auth ID: [id number].   Pt will be going to room 207. RN to call report to [phone]. PTAR will be scheduled for transportation after 3pm.   Update 12:05pm- Received message from [facility] stating transportation could be set up for pt now as they are able to accept prior to 3pm. PTAR called at 12:05om for transportation.     Final next level of care: Skilled Nursing Facility Barriers to Discharge: No Barriers Identified   Patient Goals and CMS Choice CMS Medicare.gov Compare Post Acute Care list provided to:: Patient Choice offered to / list presented to : Patient  Discharge Placement PASRR number recieved: 08/30/22 PASRR number recieved: 08/30/22            Patient chooses bed at: [facility] Patient to be transferred to facility by: PTAR Name of family member notified: Patient Patient and family notified of of transfer: 09/01/22  Discharge Plan and Services Additional resources added to the After Visit Summary for                  DME Arranged: N/A DME Agency: NA                  Social Determinants of Health (SDOH) Interventions SDOH Screenings   Food Insecurity: No Food Insecurity (08/23/2022)  Housing: Low Risk  (08/23/2022)  Transportation Needs: No Transportation Needs (08/23/2022)  Utilities: Not At Risk (08/23/2022)  Alcohol Screen: Low Risk  (03/03/2022)  Depression (PHQ2-9): Low Risk  (06/22/2022)  Recent Concern: Depression (PHQ2-9) - Medium Risk (03/27/2022)  Financial Resource Strain: Low Risk   (03/03/2022)  Physical Activity: Insufficiently Active (03/03/2022)  Social Connections: Moderately Integrated (03/03/2022)  Stress: No Stress Concern Present (03/03/2022)  Tobacco Use: Low Risk  (08/31/2022)     Readmission Risk Interventions    09/01/2022   11:01 AM  Readmission Risk Prevention Plan  Transportation Screening Complete  Medication Review (RN Care Manager) Complete  PCP or Specialist appointment within 3-5 days of discharge Complete  HRI or Home Care Consult Complete  SW Recovery Care/Counseling Consult Complete  Palliative Care Screening Not Applicable  Skilled Nursing Facility Complete

## 2022-09-01 NOTE — Progress Notes (Signed)
D/C from hospital today. Per Dr. Truett Perna: schedule for cbc/diff, INR and blood bank on 6/20. Scheduling message sent.

## 2022-09-01 NOTE — Discharge Summary (Addendum)
Physician Discharge Summary  [name] ZOX:[id number] DOB: [birth year] DOA: 08/23/2022  PCP: [name], MD  Admit date: 08/23/2022 Discharge date: 09/01/2022  Admitted From: Home Disposition:  SNF  Recommendations for Outpatient Follow-up:  Follow up with PCP in 1-2 weeks Please obtain BMP/CBC in one week   Home Health:No Equipment/Devices:None  Discharge Condition:Stable CODE STATUS:Full Diet recommendation: Heart Healthy  Brief/Interim Summary:  67 y.o. female past medical history of diabetes mellitus type 2, COPD history of DVT on Coumadin, peripheral neuropathy comes in with the ED with dizziness that started the day prior to admission.  She also been having left knee pain in the ED labs were unremarkable INR 7.0 FOBT positive and a hemoglobin of 9    Discharge Diagnoses:  Principal Problem:   Near syncope Active Problems:   HTN (hypertension)   Bilateral deep vein thromboses (HCC)   Chronic anticoagulation   Supratherapeutic INR   Chronic diastolic CHF (congestive heart failure) (HCC)   Mixed hyperlipidemia   Normocytic anemia   Left knee pain  Near syncope: Likely due to GI bleed antihypertensive medications were held on admission. She had no events on telemetry 2D echo showed an EF of 65%.  Normocytic anemia/lower GI bleed/Acute blood loss anemia: FOBT was positive INR was supratherapeutic. GI was consulted perform an EGD on 08/25/2022 that showed gastric polyp and a single bleeding telangiectasia her INR was reversed. CT scan of the abdomen pelvis showed no acute findings. She was transfused 4 units packed red blood cells her hemoglobin on the day of discharge is 9.3. She will follow-up with GI as an outpatient. [place]izzard INR 2-3.  Essential hypertension: No changes made to her medication she can resume tomorrow as an outpatient.  History of DVT on Coumadin/supratherapeutic INR: On admission her INR was 7 she was given vitamin K then it improved. GI  recommended to start anticoagulation 5 days post EGD.  Acute on chronic left knee pain/fibromyalgia: With a prior history of left arthroplasty imaging showed no acute findings. Follow-up with orthopedic surgery as an outpatient.  Chronic diastolic heart failure: Appears euvolemic no changes made to her medication.  Hypokalemia: Repleted orally now resolved.  Hyperlipidemia: Continue statins.  Morbid obesity: Noted she has been counseled.  Leukocytosis likely due to left lower lobe community-acquired pneumonia: CT scan of the abdomen pelvis was done that showed left lower lobe opacity she had a mild leukocytosis she was started empirically on Rocephin and azithromycin, ESR was elevated. Blood cultures were sent which were negative till date. She was switched to oral antibiotics to continue as an outpatient.    Discharge Instructions  Discharge Instructions     Diet - low sodium heart healthy   Complete by: As directed    Increase activity slowly   Complete by: As directed       Allergies as of 09/01/2022       Reactions   Lisinopril Anaphylaxis, Swelling, Other (See Comments)   THROAT SWELLING Pt states her throat started to "close up" after being on it for "so long." THROAT SWELLING, Pt states her throat started to "close up" after being on it for "so long."   Prednisone Anaphylaxis, Swelling, Other (See Comments)   Throat swelling and tongue irritation   Spironolactone Other (See Comments)   Caused stroke-like symptoms with the mouth   Corticosteroids Rash   States she developed a rash on her tongue after a steroid injection in her back        Medication  List     TAKE these medications    Accu-Chek Guide test strip Generic drug: glucose blood USE TO check blood glucose UP TO four times daily AS DIRECTED   Accu-Chek Softclix Lancets lancets USE TO check blood glucose UP TO four times daily AS DIRECTED   albuterol (2.5 MG/3ML) 0.083% nebulizer  solution Commonly known as: PROVENTIL USE 1 VIAL IN NEBULIZER EVERY 6 HOURS - and as needed What changed:  how much to take how to take this when to take this reasons to take this additional instructions   albuterol 108 (90 Base) MCG/ACT inhaler Commonly known as: VENTOLIN HFA INHALE TWO PUFFS BY MOUTH INTO LUNGS every FOUR hours AS NEEDED FOR SHORTNESS OF BREATH What changed:  how much to take how to take this when to take this reasons to take this additional instructions   azithromycin 250 MG tablet Commonly known as: ZITHROMAX Take 1 tab daily   blood glucose meter kit and supplies Kit Dispense based on patient and insurance preference. Use up to four times daily as directed. (FOR ICD-9 250.00, 250.01).   cefdinir 300 MG capsule Commonly known as: OMNICEF Take 1 capsule (300 mg total) by mouth every 12 (twelve) hours for 3 days.   cloNIDine 0.1 MG tablet Commonly known as: CATAPRES TAKE ONE TABLET BY MOUTH BEFORE BREAKFAST and TAKE ONE TABLET BY MOUTH EVERYDAY AT BEDTIME What changed: See the new instructions.   diclofenac sodium 1 % Gel Commonly known as: VOLTAREN Apply 2 g topically daily as needed (fibromyalgia pain- affected sites).   diltiazem 240 MG 24 hr capsule Commonly known as: CARDIZEM CD TAKE ONE CAPSULE BY MOUTH BEFORE BREAKFAST What changed: See the new instructions.   DULoxetine 30 MG capsule Commonly known as: CYMBALTA Take 90 mg by mouth at bedtime.   FeroSul 325 (65 FE) MG tablet Generic drug: ferrous sulfate TAKE ONE TABLET BY MOUTH ONCE DAILY What changed:  how much to take when to take this   fexofenadine 180 MG tablet Commonly known as: ALLEGRA TAKE ONE TABLET BY MOUTH ONCE DAILY   fluticasone 50 MCG/ACT nasal spray Commonly known as: FLONASE Place 2 sprays into both nostrils 2 (two) times daily. What changed:  when to take this reasons to take this   furosemide 80 MG tablet Commonly known as: LASIX TAKE ONE TABLET BY MOUTH  TWICE DAILY What changed: when to take this   gabapentin 300 MG capsule Commonly known as: NEURONTIN TAKE ONE CAPSULE BY MOUTH TWICE DAILY What changed:  how much to take how to take this when to take this additional instructions   Invokana 100 MG Tabs tablet Generic drug: canagliflozin TAKE ONE TABLET BY MOUTH BEFORE BREAKFAST What changed: See the new instructions.   lidocaine 5 % Commonly known as: Lidoderm Place 1 patch onto the skin daily. Remove & Discard patch within 12 hours or as directed by MD What changed:  when to take this reasons to take this additional instructions   Linzess 145 MCG Caps capsule Generic drug: linaclotide TAKE ONE CAPSULE BY MOUTH every other DAY What changed: how much to take   metFORMIN 500 MG tablet Commonly known as: GLUCOPHAGE TAKE ONE TABLET BY MOUTH EVERYDAY AT BEDTIME What changed: See the new instructions.   methocarbamol 500 MG tablet Commonly known as: ROBAXIN Take 1 tablet (500 mg total) by mouth 2 (two) times daily. What changed:  when to take this reasons to take this   omeprazole 20 MG capsule Commonly known as:  PRILOSEC TAKE ONE CAPSULE BY MOUTH BEFORE BREAKFAST and TAKE ONE CAPSULE BY MOUTH EVERY EVENING What changed: See the new instructions.   oxyCODONE 5 MG immediate release tablet Commonly known as: Oxy IR/ROXICODONE Take 1 tablet (5 mg total) by mouth every 4 (four) hours as needed for moderate pain.   potassium chloride 10 MEQ CR capsule Commonly known as: MICRO-K TAKE TWO CAPSULES BY MOUTH EVERYDAY AT BEDTIME What changed: See the new instructions.   pravastatin 40 MG tablet Commonly known as: PRAVACHOL TAKE ONE TABLET BY MOUTH EVERY EVENING   PROCare Bariatric Briefs Misc Use as needed.   QUEtiapine 50 MG tablet Commonly known as: SEROQUEL Take 50 mg by mouth in the morning and at bedtime.   Semaglutide (1 MG/DOSE) 4 MG/3ML Sopn Inject 1 mg as directed once a week. What changed:  how to take  this when to take this   spironolactone 25 MG tablet Commonly known as: ALDACTONE TAKE 1/2 TABLET BY MOUTH ONCE DAILY   Symbicort 160-4.5 MCG/ACT inhaler Generic drug: budesonide-formoterol INHALE TWO PUFFS BY MOUTH INTO LUNGS twice daily What changed: See the new instructions.   traMADol 50 MG tablet Commonly known as: ULTRAM Take 1 tablet (50 mg total) by mouth every 12 (twelve) hours as needed. What changed: reasons to take this   Vitamin D (Ergocalciferol) 1.25 MG (50000 UNIT) Caps capsule Commonly known as: DRISDOL Take 1 capsule (50,000 Units total) by mouth every 7 (seven) days.   warfarin 7.5 MG tablet Commonly known as: COUMADIN Take as directed. If you are unsure how to take this medication, talk to your nurse or doctor. Original instructions: TAKE ONE TABLET BY MOUTH DAILY What changed:  additional instructions Another medication with the same name was removed. Continue taking this medication, and follow the directions you see here.   zonisamide 100 MG capsule Commonly known as: ZONEGRAN Take 100-200 mg by mouth See admin instructions. Take 100 mg by mouth in the morning and 200 mg in the evening        Allergies  Allergen Reactions   Lisinopril Anaphylaxis, Swelling and Other (See Comments)    THROAT SWELLING  Pt states her throat started to "close up" after being on it for "so long."  THROAT SWELLING, Pt states her throat started to "close up" after being on it for "so long."   Prednisone Anaphylaxis, Swelling and Other (See Comments)    Throat swelling and tongue irritation   Spironolactone Other (See Comments)    Caused stroke-like symptoms with the mouth   Corticosteroids Rash    States she developed a rash on her tongue after a steroid injection in her back    Consultations: Enterology   Procedures/Studies: CT ABDOMEN PELVIS W CONTRAST  Result Date: 08/30/2022 CLINICAL DATA:  Flank pain. EXAM: CT ABDOMEN AND PELVIS WITH CONTRAST TECHNIQUE:  Multidetector CT imaging of the abdomen and pelvis was performed using the standard protocol following bolus administration of intravenous contrast. RADIATION DOSE REDUCTION: This exam was performed according to the departmental dose-optimization program which includes automated exposure control, adjustment of the mA and/or kV according to patient size and/or use of iterative reconstruction technique. CONTRAST:  OMNIPAQUE IOHEXOL 300 MG/ML  SOLN COMPARISON:  Noncontrast CT on 08/27/2022 FINDINGS: Lower Chest: Increased airspace opacity in left lower lung, suspicious for pneumonia. Hepatobiliary: A few tiny sub-cm low attention lesions in the left hepatic lobe are too small to characterize, but remains stable and most likely represent tiny cysts. No suspicious hepatic masses  identified. Prior cholecystectomy. No evidence of biliary obstruction. Pancreas:  No mass or inflammatory changes. Spleen: Within normal limits in size and appearance. Adrenals/Urinary Tract: Stable 2.8 cm right adrenal mass, consistent with benign adenoma (No followup imaging is recommended). No suspicious renal masses identified. No evidence of ureteral calculi or hydronephrosis. Unremarkable unopacified urinary bladder. Stomach/Bowel: No evidence of obstruction, inflammatory process or abnormal fluid collections. Normal appendix visualized. Vascular/Lymphatic: No pathologically enlarged lymph nodes. No acute vascular findings. Reproductive: Prior hysterectomy noted. Adnexal regions are unremarkable in appearance. Other:  None. Musculoskeletal:  No suspicious bone lesions identified. IMPRESSION: No acute findings within the abdomen or pelvis. No evidence of urolithiasis or hydronephrosis. Increased airspace opacity in left lower lung, suspicious for pneumonia. Electronically Signed   By: Danae Orleans M.D.   On: 08/30/2022 15:11   CT ABDOMEN PELVIS WO CONTRAST  Result Date: 08/27/2022 CLINICAL DATA:  Abdominal pain, acute, nonlocalized  abd pain, unclear etiology, rule out RPH, on Coumadin with supratherapeutic INR on admission EXAM: CT ABDOMEN AND PELVIS WITHOUT CONTRAST TECHNIQUE: Multidetector CT imaging of the abdomen and pelvis was performed following the standard protocol without IV contrast. RADIATION DOSE REDUCTION: This exam was performed according to the departmental dose-optimization program which includes automated exposure control, adjustment of the mA and/or kV according to patient size and/or use of iterative reconstruction technique. COMPARISON:  CT 05/29/2018 FINDINGS: Lower chest: Nodular and ground-glass opacities within the left lower lobe and lingula. Heart size upper normal. Hepatobiliary: Diffuse hepatic steatosis. Scattered subcentimeter hepatic hypodensities are stable from prior exam. The liver is mildly enlarged spanning 18 cm cranial caudal. Clips in the gallbladder fossa postcholecystectomy. No biliary dilatation. Pancreas: No ductal dilatation or inflammation. Spleen: Normal in size without focal abnormality. Adrenals/Urinary Tract: 3.1 cm right adrenal nodule with Hounsfield units of 23 stable from 2020 exam consistent with benign adenoma. No left adrenal nodule. No hydronephrosis. No intrarenal calculi. There are multiple left retroperitoneal phleboliths that are adjacent to the ureter. Unremarkable urinary bladder. Stomach/Bowel: Small hiatal hernia. Scattered high-density within the stomach and duodenum, some of which appears linear. These may be related to clips or ingested material. Enteric contrast is seen within distal small bowel and proximal colon, there is no obstruction. No bowel inflammation. Normal appendix is seen. Moderate volume of stool in the right colon. Small volume of colonic stool distally. Vascular/Lymphatic: Normal caliber abdominal aorta. Multiple retroperitoneal phleboliths. There is no evidence of retroperitoneal stranding or hemorrhage. Reproductive: Status post hysterectomy. No adnexal  masses. Other: No free air or free fluid. No retroperitoneal or intra-abdominopelvic stranding. There is mild edema in the subcutaneous flank tissues. Small fat containing umbilical hernia. Musculoskeletal: Anterior fusion L4-L5. Lumbar facet hypertrophy. There are no acute or suspicious osseous abnormalities. IMPRESSION: 1. No acute abnormality in the abdomen/pelvis. No evidence of retroperitoneal stranding or hemorrhage. 2. Nodular and ground-glass opacities within the left lower lobe and lingula, suspicious for pneumonia. 3. Hepatic steatosis and mild hepatomegaly. 4. Stable right adrenal adenoma, needing no further imaging follow-up. Electronically Signed   By: Narda Rutherford M.D.   On: 08/27/2022 19:05   CT KNEE LEFT WO CONTRAST  Result Date: 08/25/2022 CLINICAL DATA:  Knee replacement, infection suspected. Postop total knee replacement. EXAM: CT OF THE LEFT KNEE WITHOUT CONTRAST TECHNIQUE: Multidetector CT imaging of the left knee was performed according to the standard protocol. Multiplanar CT image reconstructions were also generated. RADIATION DOSE REDUCTION: This exam was performed according to the departmental dose-optimization program which includes automated exposure control, adjustment  of the mA and/or kV according to patient size and/or use of iterative reconstruction technique. COMPARISON:  Radiographs dated August 23, 2022 FINDINGS: Bones/Joint/Cartilage Status post left knee arthroplasty with intact hardware. No perihardware loosening or erosive changes. Small joint effusion. Ligaments Suboptimally assessed by CT. Muscles and Tendons Muscles are normal in bulk. No evidence of intramuscular fluid collection or infectious or inflammatory process. Quadriceps and patellar tendon appear intact. Soft tissues Skin and subcutaneous soft tissues are within normal limits. No fluid collection or abscess. IMPRESSION: 1. Status post left knee arthroplasty with intact hardware. No perihardware loosening or  erosive changes. No CT evidence to suggest infectious or inflammatory process. Joint aspiration could be considered if there is persistent clinical concern. 2. Small joint effusion. 3. Muscles and subcutaneous soft tissues are within normal limits. Electronically Signed   By: Larose Hires D.O.   On: 08/25/2022 20:43   ECHOCARDIOGRAM COMPLETE  Result Date: 08/24/2022    ECHOCARDIOGRAM REPORT   Patient Name:   [facility] Pustejovsky Date of Exam: 08/24/2022 Medical Rec #:  [id number]           Height:       66.0 in Accession #:    [id number]          Weight:       282.0 lb Date of Birth:  September 09, 1955           BSA:          2.314 m Patient Age:    67 years            BP:           132/90 mmHg Patient Gender: F                   HR:           100 bpm. Exam Location:  Inpatient Procedure: 2D Echo, Cardiac Doppler and Color Doppler Indications:    Syncope  History:        Patient has prior history of Echocardiogram examinations, most                 recent 05/04/2020. CHF, COPD, Signs/Symptoms:Syncope and                 Dizziness/Lightheadedness; Risk Factors:Hypertension,                 Dyslipidemia, Diabetes, Hx DVT, substance abuse and Sleep Apnea.  Sonographer:    Wallie Char Referring Phys: [id number] RIPUDEEP K RAI  Sonographer Comments: Patient is obese. Image acquisition challenging due to patient body habitus. IMPRESSIONS  1. Left ventricular ejection fraction, by estimation, is 65 to 70%. The left ventricle has normal function. The left ventricle has no regional wall motion abnormalities. Left ventricular diastolic parameters are consistent with Grade I diastolic dysfunction (impaired relaxation). Elevated left ventricular end-diastolic pressure.  2. Right ventricular systolic function is normal. The right ventricular size is normal.  3. The mitral valve is normal in structure. No evidence of mitral valve regurgitation. No evidence of mitral stenosis.  4. The aortic valve was not well visualized. There is mild  calcification of the aortic valve. Aortic valve regurgitation is not visualized. Aortic valve sclerosis is present, with no evidence of aortic valve stenosis.  5. The inferior vena cava is normal in size with greater than 50% respiratory variability, suggesting right atrial pressure of 3 mmHg. FINDINGS  Left Ventricle: Left ventricular ejection fraction, by estimation, is 65 to 70%. The left  ventricle has normal function. The left ventricle has no regional wall motion abnormalities. The left ventricular internal cavity size was normal in size. There is  no left ventricular hypertrophy. Left ventricular diastolic parameters are consistent with Grade I diastolic dysfunction (impaired relaxation). Elevated left ventricular end-diastolic pressure. Right Ventricle: The right ventricular size is normal. No increase in right ventricular wall thickness. Right ventricular systolic function is normal. Left Atrium: Left atrial size was normal in size. Right Atrium: Right atrial size was normal in size. Pericardium: There is no evidence of pericardial effusion. Mitral Valve: The mitral valve is normal in structure. No evidence of mitral valve regurgitation. No evidence of mitral valve stenosis. MV peak gradient, 8.0 mmHg. The mean mitral valve gradient is 2.0 mmHg. Tricuspid Valve: The tricuspid valve is normal in structure. Tricuspid valve regurgitation is not demonstrated. No evidence of tricuspid stenosis. Aortic Valve: The aortic valve was not well visualized. There is mild calcification of the aortic valve. Aortic valve regurgitation is not visualized. Aortic valve sclerosis is present, with no evidence of aortic valve stenosis. Aortic valve mean gradient measures 5.0 mmHg. Aortic valve peak gradient measures 9.0 mmHg. Aortic valve area, by VTI measures 2.18 cm. Pulmonic Valve: The pulmonic valve was normal in structure. Pulmonic valve regurgitation is not visualized. No evidence of pulmonic stenosis. Aorta: The aortic  root is normal in size and structure. Venous: The inferior vena cava is normal in size with greater than 50% respiratory variability, suggesting right atrial pressure of 3 mmHg. IAS/Shunts: The interatrial septum was not well visualized.  LEFT VENTRICLE PLAX 2D LVIDd:         3.60 cm     Diastology LVIDs:         2.40 cm     LV e' medial:    4.56 cm/s LV PW:         1.00 cm     LV E/e' medial:  15.1 LV IVS:        1.00 cm     LV e' lateral:   4.72 cm/s LVOT diam:     1.90 cm     LV E/e' lateral: 14.6 LV SV:         48 LV SV Index:   21 LVOT Area:     2.84 cm  LV Volumes (MOD) LV vol d, MOD A2C: 71.5 ml LV vol d, MOD A4C: 80.4 ml LV vol s, MOD A2C: 24.2 ml LV vol s, MOD A4C: 26.6 ml LV SV MOD A2C:     47.3 ml LV SV MOD A4C:     80.4 ml LV SV MOD BP:      50.2 ml RIGHT VENTRICLE RV S prime:     29.00 cm/s TAPSE (M-mode): 1.7 cm LEFT ATRIUM             Index        RIGHT ATRIUM           Index LA diam:        3.10 cm 1.34 cm/m   RA Area:     10.40 cm LA Vol (A2C):   40.8 ml 17.63 ml/m  RA Volume:   21.80 ml  9.42 ml/m LA Vol (A4C):   36.5 ml 15.77 ml/m LA Biplane Vol: 41.1 ml 17.76 ml/m  AORTIC VALVE AV Area (Vmax):    2.35 cm AV Area (Vmean):   2.30 cm AV Area (VTI):     2.18 cm AV Vmax:  150.00 cm/s AV Vmean:          97.600 cm/s AV VTI:            0.220 m AV Peak Grad:      9.0 mmHg AV Mean Grad:      5.0 mmHg LVOT Vmax:         124.50 cm/s LVOT Vmean:        79.250 cm/s LVOT VTI:          0.169 m LVOT/AV VTI ratio: 0.77  AORTA Ao Root diam: 3.20 cm Ao Asc diam:  3.30 cm MITRAL VALVE               TRICUSPID VALVE MV Area (PHT): 3.77 cm    TR Peak grad:   25.8 mmHg MV Area VTI:   2.46 cm    TR Vmax:        254.00 cm/s MV Peak grad:  8.0 mmHg MV Mean grad:  2.0 mmHg    SHUNTS MV Vmax:       1.41 m/s    Systemic VTI:  0.17 m MV Vmean:      72.0 cm/s   Systemic Diam: 1.90 cm MV Decel Time: 201 msec MV E velocity: 68.80 cm/s MV A velocity: 98.90 cm/s MV E/A ratio:  0.70 Charlton Haws MD Electronically  signed by Charlton Haws MD Signature Date/Time: 08/24/2022/12:24:16 PM    Final    CT Head Wo Contrast  Result Date: 08/23/2022 CLINICAL DATA:  Syncope/presyncope, cerebrovascular cause suspected EXAM: CT HEAD WITHOUT CONTRAST TECHNIQUE: Contiguous axial images were obtained from the base of the skull through the vertex without intravenous contrast. RADIATION DOSE REDUCTION: This exam was performed according to the departmental dose-optimization program which includes automated exposure control, adjustment of the mA and/or kV according to patient size and/or use of iterative reconstruction technique. COMPARISON:  CT head 07/31/22 FINDINGS: Brain: No evidence of acute infarction, hemorrhage, hydrocephalus, extra-axial collection or mass lesion/mass effect. 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 Vascular: No hyperdense vessel or unexpected calcification. Skull: Normal. Negative for fracture or focal lesion. Sinuses/Orbits: No middle ear or mastoid effusion. Paranasal sinuses are clear. Bilateral lens replacement. Orbits are otherwise unremarkable. Other: None. IMPRESSION: 1. No acute intracranial abnormality. 2. 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   On: 08/23/2022 14:16   DG Knee Complete 4 Views Left  Result Date: 08/23/2022 CLINICAL DATA:  Weakness and dizziness.  Left knee pain. EXAM: LEFT KNEE - COMPLETE 4+ VIEW COMPARISON:  10/07/2016 FINDINGS: Status post left total knee arthroplasty. Small suprapatellar joint effusion is suspected. No signs of periprosthetic fracture, dislocation or loosening. Soft tissues are unremarkable. IMPRESSION: 1. Status post left total knee arthroplasty. No signs of periprosthetic fracture, loosening or dislocation 2. Small suprapatellar joint effusion. Electronically Signed   By: Signa Kell M.D.   On:  08/23/2022 12:08   DG Chest Port 1 View  Result Date: 08/23/2022 CLINICAL DATA:  weak EXAM: PORTABLE CHEST 1 VIEW COMPARISON:  CXR 02/18/21 FINDINGS: Borderline cardiomegaly. No pleural effusion. No pneumothorax. No focal airspace opacity. No radiographically apparent displaced rib fractures. Visualized upper abdomen is unremarkable. Partially visualized cervical spinal fusion hardware in place. IMPRESSION: No focal airspace opacity.  Borderline cardiomegaly. Electronically Signed   By: Lorenza Cambridge M.D.   On: 08/23/2022 12:05   (Echo, Carotid, EGD,  Colonoscopy, ERCP)    Subjective: No complaints.  Discharge Exam: Vitals:   08/31/22 2324 09/01/22 0536  BP: 133/80 (!) 158/83  Pulse: 78 79  Resp: 16 16  Temp: 98.5 F (36.9 C) 97.9 F (36.6 C)  SpO2: 96% 97%   Vitals:   08/31/22 2037 08/31/22 2102 08/31/22 2324 09/01/22 0536  BP: (!) 157/86 (!) 141/64 133/80 (!) 158/83  Pulse: 85 88 78 79  Resp: 16 16 16 16   Temp: 98 F (36.7 C) 98.7 F (37.1 C) 98.5 F (36.9 C) 97.9 F (36.6 C)  TempSrc: Oral Oral Oral Oral  SpO2: 99% 97% 96% 97%  Weight:      Height:        General: Pt is alert, awake, not in acute distress Cardiovascular: RRR, S1/S2 +, no rubs, no gallops Respiratory: CTA bilaterally, no wheezing, no rhonchi Abdominal: Soft, NT, ND, bowel sounds + Extremities: no edema, no cyanosis    The results of significant diagnostics from this hospitalization (including imaging, microbiology, ancillary and laboratory) are listed below for reference.     Microbiology: Recent Results (from the past 240 hour(s))  Culture, blood (Routine X 2) w Reflex to ID Panel     Status: None   Collection Time: 08/26/22  2:11 PM   Specimen: BLOOD  Result Value Ref Range Status   Specimen Description   Final    BLOOD BLOOD RIGHT ARM Performed at [facility], [address], [place], [place] [address]    Special Requests   Final    BOTTLES DRAWN AEROBIC AND ANAEROBIC  Blood Culture adequate volume Performed at [facility], [address] [place], [place] [address]    Culture   Final    NO GROWTH 5 DAYS Performed at [facility], [address], [place], [place] [address]    Report Status 08/31/2022 FINAL  Final  Culture, blood (Routine X 2) w Reflex to ID Panel     Status: None   Collection Time: 08/26/22  2:16 PM   Specimen: BLOOD  Result Value Ref Range Status   Specimen Description   Final    BLOOD BLOOD LEFT ARM Performed at [facility], [address] [place], [place] [address]    Special Requests   Final    BOTTLES DRAWN AEROBIC AND ANAEROBIC Blood Culture adequate volume Performed at [facility] [address] [place], [place] [address]    Culture   Final    NO GROWTH 5 DAYS Performed at [facility], [address], [place], [place] [address]    Report Status 08/31/2022 FINAL  Final     Labs: BNP (last 3 results) No results for input(s): "BNP" in the last 8760 hours. Basic Metabolic Panel: Recent Labs  Lab 08/28/22 0625 08/29/22 0552 08/30/22 0544 08/30/22 1018 08/31/22 0546  NA 137 138 133* 137 136  K 4.0 3.7 3.1* 3.2* 3.9  CL 101 101 98 101 102  CO2 26 28 23 26 25   GLUCOSE 189* 158* 160* 151* 168*  BUN 22 19 17 16 20   CREATININE 0.85 0.82 0.79 0.94 0.71  CALCIUM 8.0* 8.1* 7.9* 8.1* 8.0*   Liver Function Tests: Recent Labs  Lab 08/30/22 1018  AST 18  ALT 24  ALKPHOS 75  BILITOT 0.4  PROT 7.2  ALBUMIN 3.1*   No results for input(s): "LIPASE", "AMYLASE" in the last 168 hours. No results for input(s): "AMMONIA" in the last 168  hours. CBC: Recent Labs  Lab 08/27/22 0550 08/28/22 0625 08/29/22 0552 08/30/22 0544 08/31/22 0546 09/01/22 0644  WBC 17.1* 15.4* 14.1* 12.3* 11.3*  --   NEUTROABS  --   --   --   --  7.8*  --   HGB 7.5* 7.0* 7.2* 7.3* 6.8* 9.3*  HCT 23.6* 22.2* 23.5* 23.7* 22.6* 28.9*  MCV 88.4 88.1 89.0 88.4 89.3  --    PLT 266 297 328 326 344  --    Cardiac Enzymes: No results for input(s): "CKTOTAL", "CKMB", "CKMBINDEX", "TROPONINI" in the last 168 hours. BNP: Invalid input(s): "POCBNP" CBG: Recent Labs  Lab 08/31/22 0716 08/31/22 1158 08/31/22 1713 08/31/22 2105 09/01/22 0742  GLUCAP 160* 163* 155* 183* 158*   D-Dimer No results for input(s): "DDIMER" in the last 72 hours. Hgb A1c No results for input(s): "HGBA1C" in the last 72 hours. Lipid Profile No results for input(s): "CHOL", "HDL", "LDLCALC", "TRIG", "CHOLHDL", "LDLDIRECT" in the last 72 hours. Thyroid function studies No results for input(s): "TSH", "T4TOTAL", "T3FREE", "THYROIDAB" in the last 72 hours.  Invalid input(s): "FREET3" Anemia work up No results for input(s): "VITAMINB12", "FOLATE", "FERRITIN", "TIBC", "IRON", "RETICCTPCT" in the last 72 hours. Urinalysis    Component Value Date/Time   COLORURINE YELLOW 08/30/2022 1419   APPEARANCEUR CLEAR 08/30/2022 1419   LABSPEC 1.012 08/30/2022 1419   PHURINE 5.0 08/30/2022 1419   GLUCOSEU NEGATIVE 08/30/2022 1419   GLUCOSEU NEGATIVE 03/27/2022 1557   HGBUR NEGATIVE 08/30/2022 1419   BILIRUBINUR NEGATIVE 08/30/2022 1419   BILIRUBINUR negative 03/27/2022 1449   KETONESUR NEGATIVE 08/30/2022 1419   PROTEINUR NEGATIVE 08/30/2022 1419   UROBILINOGEN 0.2 03/27/2022 1557   UROBILINOGEN 0.2 03/27/2022 1449   NITRITE NEGATIVE 08/30/2022 1419   LEUKOCYTESUR NEGATIVE 08/30/2022 1419   Sepsis Labs Recent Labs  Lab 08/28/22 0625 08/29/22 0552 08/30/22 0544 08/31/22 0546  WBC 15.4* 14.1* 12.3* 11.3*   Microbiology Recent Results (from the past 240 hour(s))  Culture, blood (Routine X 2) w Reflex to ID Panel     Status: None   Collection Time: 08/26/22  2:11 PM   Specimen: BLOOD  Result Value Ref Range Status   Specimen Description   Final    BLOOD BLOOD RIGHT ARM Performed at [facility], [address], [place], [place] [address]    Special Requests    Final    BOTTLES DRAWN AEROBIC AND ANAEROBIC Blood Culture adequate volume Performed at [facility] [address] [place], [place] [address]    Culture   Final    NO GROWTH 5 DAYS Performed at [facility], [address], [place], [place] [address]    Report Status 08/31/2022 FINAL  Final  Culture, blood (Routine X 2) w Reflex to ID Panel     Status: None   Collection Time: 08/26/22  2:16 PM   Specimen: BLOOD  Result Value Ref Range Status   Specimen Description   Final    BLOOD BLOOD LEFT ARM Performed at [facility], [address], [place], [place] [address]    Special Requests   Final    BOTTLES DRAWN AEROBIC AND ANAEROBIC Blood Culture adequate volume Performed at [facility], [address] [place], [place] [address]    Culture   Final    NO GROWTH 5 DAYS Performed at [facility], [address] [place], [place] [address]    Report Status 08/31/2022 FINAL  Final  SIGNED:   Marinda Elk, MD  [facility] 09/01/2022, 9:49 AM Pager   If 7PM-7AM, please contact night-coverage [email or web address] Password [email or web address]

## 2022-09-02 ENCOUNTER — Emergency Department (HOSPITAL_COMMUNITY)
Admission: EM | Admit: 2022-09-02 | Discharge: 2022-09-02 | Disposition: A | Discharge status: Home / Self Care | Payer: 59 | Attending: Emergency Medicine | Admitting: Emergency Medicine

## 2022-09-02 ENCOUNTER — Other Ambulatory Visit: Payer: Self-pay

## 2022-09-02 ENCOUNTER — Encounter (HOSPITAL_COMMUNITY): Payer: Self-pay | Admitting: Emergency Medicine

## 2022-09-02 ENCOUNTER — Encounter: Payer: Self-pay | Admitting: Family Medicine

## 2022-09-02 ENCOUNTER — Emergency Department (HOSPITAL_COMMUNITY): Payer: 59

## 2022-09-02 DIAGNOSIS — K769 Liver disease, unspecified: Secondary | ICD-10-CM | POA: Diagnosis not present

## 2022-09-02 DIAGNOSIS — E119 Type 2 diabetes mellitus without complications: Secondary | ICD-10-CM | POA: Diagnosis not present

## 2022-09-02 DIAGNOSIS — Z79899 Other long term (current) drug therapy: Secondary | ICD-10-CM | POA: Diagnosis not present

## 2022-09-02 DIAGNOSIS — R109 Unspecified abdominal pain: Secondary | ICD-10-CM

## 2022-09-02 DIAGNOSIS — J189 Pneumonia, unspecified organism: Secondary | ICD-10-CM | POA: Insufficient documentation

## 2022-09-02 DIAGNOSIS — R1031 Right lower quadrant pain: Secondary | ICD-10-CM | POA: Insufficient documentation

## 2022-09-02 DIAGNOSIS — E876 Hypokalemia: Secondary | ICD-10-CM | POA: Insufficient documentation

## 2022-09-02 DIAGNOSIS — D72829 Elevated white blood cell count, unspecified: Secondary | ICD-10-CM | POA: Insufficient documentation

## 2022-09-02 DIAGNOSIS — I110 Hypertensive heart disease with heart failure: Secondary | ICD-10-CM | POA: Diagnosis not present

## 2022-09-02 DIAGNOSIS — R55 Syncope and collapse: Secondary | ICD-10-CM | POA: Diagnosis not present

## 2022-09-02 DIAGNOSIS — I509 Heart failure, unspecified: Secondary | ICD-10-CM | POA: Insufficient documentation

## 2022-09-02 DIAGNOSIS — Z7984 Long term (current) use of oral hypoglycemic drugs: Secondary | ICD-10-CM | POA: Diagnosis not present

## 2022-09-02 LAB — CBC WITH DIFFERENTIAL/PLATELET
Abs Immature Granulocytes: 0.1 10*3/uL — ABNORMAL HIGH (ref 0.00–0.07)
Basophils Absolute: 0 10*3/uL (ref 0.0–0.1)
Basophils Relative: 0 %
Eosinophils Absolute: 0.1 10*3/uL (ref 0.0–0.5)
Eosinophils Relative: 1 %
HCT: 31.9 % — ABNORMAL LOW (ref 36.0–46.0)
Hemoglobin: 10.3 g/dL — ABNORMAL LOW (ref 12.0–15.0)
Immature Granulocytes: 1 %
Lymphocytes Relative: 14 %
Lymphs Abs: 1.6 10*3/uL (ref 0.7–4.0)
MCH: 27.5 pg (ref 26.0–34.0)
MCHC: 32.3 g/dL (ref 30.0–36.0)
MCV: 85.1 fL (ref 80.0–100.0)
Monocytes Absolute: 0.7 10*3/uL (ref 0.1–1.0)
Monocytes Relative: 6 %
Neutro Abs: 9 10*3/uL — ABNORMAL HIGH (ref 1.7–7.7)
Neutrophils Relative %: 78 %
Platelets: 466 10*3/uL — ABNORMAL HIGH (ref 150–400)
RBC: 3.75 MIL/uL — ABNORMAL LOW (ref 3.87–5.11)
RDW: 16.6 % — ABNORMAL HIGH (ref 11.5–15.5)
WBC: 11.5 10*3/uL — ABNORMAL HIGH (ref 4.0–10.5)
nRBC: 2.4 % — ABNORMAL HIGH (ref 0.0–0.2)

## 2022-09-02 LAB — URINALYSIS, ROUTINE W REFLEX MICROSCOPIC
Bilirubin Urine: NEGATIVE
Glucose, UA: NEGATIVE mg/dL
Ketones, ur: 5 mg/dL — AB
Leukocytes,Ua: NEGATIVE
Nitrite: NEGATIVE
Protein, ur: 100 mg/dL — AB
Specific Gravity, Urine: 1.031 — ABNORMAL HIGH (ref 1.005–1.030)
pH: 8 (ref 5.0–8.0)

## 2022-09-02 LAB — LIPASE, BLOOD: Lipase: 30 U/L (ref 11–51)

## 2022-09-02 LAB — COMPREHENSIVE METABOLIC PANEL
ALT: 26 U/L (ref 0–44)
AST: 23 U/L (ref 15–41)
Albumin: 3.6 g/dL (ref 3.5–5.0)
Alkaline Phosphatase: 97 U/L (ref 38–126)
Anion gap: 12 (ref 5–15)
BUN: 12 mg/dL (ref 8–23)
CO2: 26 mmol/L (ref 22–32)
Calcium: 8.5 mg/dL — ABNORMAL LOW (ref 8.9–10.3)
Chloride: 100 mmol/L (ref 98–111)
Creatinine, Ser: 0.86 mg/dL (ref 0.44–1.00)
GFR, Estimated: >60 mL/min (ref >60)
Glucose, Bld: 149 mg/dL — ABNORMAL HIGH (ref 70–99)
Potassium: 2.7 mmol/L — CL (ref 3.5–5.1)
Sodium: 138 mmol/L (ref 135–145)
Total Bilirubin: 0.6 mg/dL (ref 0.3–1.2)
Total Protein: 8.1 g/dL (ref 6.5–8.1)

## 2022-09-02 LAB — MAGNESIUM: Magnesium: 1.5 mg/dL — ABNORMAL LOW (ref 1.7–2.4)

## 2022-09-02 LAB — POTASSIUM: Potassium: 3.8 mmol/L (ref 3.5–5.1)

## 2022-09-02 MED ORDER — MAGNESIUM SULFATE 2 GM/50ML IV SOLN: 2.0000 g | Freq: Once | INTRAVENOUS | Status: DC

## 2022-09-02 MED ORDER — POTASSIUM CHLORIDE CRYS ER 20 MEQ PO TBCR
40.0000 meq | EXTENDED_RELEASE_TABLET | Freq: Once | ORAL | Status: AC
  Administered 2022-09-02: 40 meq via ORAL
  Filled 2022-09-02: qty 2

## 2022-09-02 MED ORDER — POTASSIUM CHLORIDE 10 MEQ/100ML IV SOLN
INTRAVENOUS | Status: AC
  Filled 2022-09-02: qty 100

## 2022-09-02 MED ORDER — MORPHINE SULFATE (PF) 4 MG/ML IV SOLN
4.0000 mg | Freq: Once | INTRAVENOUS | Status: AC
  Administered 2022-09-02: 4 mg via INTRAVENOUS
  Filled 2022-09-02: qty 1

## 2022-09-02 MED ORDER — MAGNESIUM OXIDE 400 MG PO CAPS: 400.0000 mg | ORAL_CAPSULE | Freq: Every day | ORAL | 0 refills | Status: DC

## 2022-09-02 MED ORDER — POTASSIUM CHLORIDE 10 MEQ/100ML IV SOLN
10.0000 meq | INTRAVENOUS | Status: AC
  Administered 2022-09-02 (×2): 10 meq via INTRAVENOUS
  Filled 2022-09-02 (×2): qty 100

## 2022-09-02 MED ORDER — IOHEXOL 300 MG/ML  SOLN
100.0000 mL | Freq: Once | INTRAMUSCULAR | Status: AC | PRN
  Administered 2022-09-02: 100 mL via INTRAVENOUS

## 2022-09-02 MED ORDER — MAGNESIUM OXIDE -MG SUPPLEMENT 400 (240 MG) MG PO TABS
400.0000 mg | ORAL_TABLET | Freq: Every day | ORAL | Status: DC
  Administered 2022-09-02: 400 mg via ORAL
  Filled 2022-09-02: qty 1

## 2022-09-02 NOTE — ED Provider Notes (Signed)
[place] EMERGENCY DEPARTMENT AT [facility] Provider Note   CSN: [id number] Arrival date & time: 09/02/22  1407     History {Add pertinent medical, surgical, social history, OB history to HPI:1} Chief Complaint  Patient presents with   Flank Pain    [name] is a 67 y.o. female with past medical history significant for hypertension, CHF, diabetes, obesity who was recently admitted and discharged after an acute upper GI bleed secondary to warfarin use who presents with concern for focal right lower quadrant pain beginning this morning.  Patient endorses 8/9 out of 10 pain.  She reports that she still has her appendix.  She is on cefdinir right now for pneumonia.  She denies chest pain, shortness of breath today.   Flank Pain       Home Medications Prior to Admission medications   Medication Sig Start Date End Date Taking? Authorizing Provider  ACCU-CHEK GUIDE test strip USE TO check blood glucose UP TO four times daily AS DIRECTED 04/14/22   [name], MD  Accu-Chek Softclix Lancets lancets USE TO check blood glucose UP TO four times daily AS DIRECTED 07/24/22   [name], MD  albuterol (PROVENTIL) (2.5 MG/3ML) 0.083% nebulizer solution USE 1 VIAL IN NEBULIZER EVERY 6 HOURS - and as needed Patient taking differently: Take 2.5 mg by nebulization every 6 (six) hours as needed for wheezing or shortness of breath. 08/11/21   [name], MD  albuterol (VENTOLIN HFA) 108 (90 Base) MCG/ACT inhaler INHALE TWO PUFFS BY MOUTH INTO LUNGS every FOUR hours AS NEEDED FOR SHORTNESS OF BREATH Patient taking differently: Inhale 2 puffs into the lungs every 4 (four) hours as needed for shortness of breath. 04/21/22   [name], MD  azithromycin (ZITHROMAX) 250 MG tablet Take 1 tab daily 09/01/22   Marinda Elk, MD  blood glucose meter kit and supplies KIT Dispense based on patient and insurance preference. Use up to four times daily as directed.  (FOR ICD-9 250.00, 250.01). 07/16/19   [name], MD  cefdinir (OMNICEF) 300 MG capsule Take 1 capsule (300 mg total) by mouth every 12 (twelve) hours for 3 days. 09/01/22 09/04/22  Marinda Elk, MD  cloNIDine (CATAPRES) 0.1 MG tablet TAKE ONE TABLET BY MOUTH BEFORE BREAKFAST and TAKE ONE TABLET BY MOUTH EVERYDAY AT BEDTIME Patient taking differently: Take 0.1 mg by mouth See admin instructions. Take 0.1 mg by mouth before breakfast and at bedtime 04/14/22   [name], MD  diclofenac sodium (VOLTAREN) 1 % GEL Apply 2 g topically daily as needed (fibromyalgia pain- affected sites).    [provider]  diltiazem (CARDIZEM CD) 240 MG 24 hr capsule TAKE ONE CAPSULE BY MOUTH BEFORE BREAKFAST Patient taking differently: Take 240 mg by mouth daily before breakfast. 11/15/21   [name], MD  DULoxetine (CYMBALTA) 30 MG capsule Take 90 mg by mouth at bedtime.    [provider]  FEROSUL 325 (65 Fe) MG tablet TAKE ONE TABLET BY MOUTH ONCE DAILY Patient taking differently: Take 325 mg by mouth daily with breakfast. 07/19/21   [name], MD  fexofenadine (ALLEGRA) 180 MG tablet TAKE ONE TABLET BY MOUTH ONCE DAILY Patient taking differently: Take 180 mg by mouth daily. 07/24/22   [name], MD  fluticasone (FLONASE) 50 MCG/ACT nasal spray Place 2 sprays into both nostrils 2 (two) times daily. Patient taking differently: Place 2 sprays into both nostrils 2 (two)  times daily as needed for allergies or rhinitis. 06/22/22   [name], MD  furosemide (LASIX) 80 MG tablet TAKE ONE TABLET BY MOUTH TWICE DAILY Patient taking differently: Take 80 mg by mouth 2 (two) times daily. 02/14/22   Chandrasekhar, [name] A, MD  gabapentin (NEURONTIN) 300 MG capsule TAKE ONE CAPSULE BY MOUTH TWICE DAILY Patient taking differently: Take 300 mg by mouth See admin instructions. Take 300 mg by mouth at 6 PM and at bedtime 01/13/22   [name], MD  Incontinence Supply  Disposable (PROCARE BARIATRIC BRIEFS) MISC Use as needed. 04/20/22   [name], MD  INVOKANA 100 MG TABS tablet TAKE ONE TABLET BY MOUTH BEFORE BREAKFAST Patient taking differently: Take 100 mg by mouth daily before breakfast. 11/15/21   Chandrasekhar, [name] A, MD  lidocaine (LIDODERM) 5 % Place 1 patch onto the skin daily. Remove & Discard patch within 12 hours or as directed by MD Patient taking differently: Place 1 patch onto the skin daily as needed (to painful site- Remove & Discard patch within 12 hours or as directed by MD). 03/22/22   Blue, Soijett A, PA-C  LINZESS 145 MCG CAPS capsule TAKE ONE CAPSULE BY MOUTH every other DAY Patient taking differently: Take 145 mcg by mouth every other day. 02/14/22   [name], MD  metFORMIN (GLUCOPHAGE) 500 MG tablet TAKE ONE TABLET BY MOUTH EVERYDAY AT BEDTIME Patient taking differently: Take 500 mg by mouth at bedtime. 11/15/21   [name], MD  methocarbamol (ROBAXIN) 500 MG tablet Take 1 tablet (500 mg total) by mouth 2 (two) times daily. Patient taking differently: Take 500 mg by mouth 2 (two) times daily as needed for muscle spasms. 03/22/22   Blue, Soijett A, PA-C  omeprazole (PRILOSEC) 20 MG capsule TAKE ONE CAPSULE BY MOUTH BEFORE BREAKFAST and TAKE ONE CAPSULE BY MOUTH EVERY EVENING Patient taking differently: Take 20 mg by mouth See admin instructions. Take 20 mg by mouth before breakfast and in the evening 06/14/22   [name], MD  oxyCODONE (OXY IR/ROXICODONE) 5 MG immediate release tablet Take 1 tablet (5 mg total) by mouth every 4 (four) hours as needed for moderate pain. 09/01/22   Marinda Elk, MD  potassium chloride (MICRO-K) 10 MEQ CR capsule TAKE TWO CAPSULES BY MOUTH EVERYDAY AT BEDTIME Patient taking differently: Take 20 mEq by mouth at bedtime. TAKE TWO CAPSULES BY MOUTH EVERYDAY AT BEDTIME 04/14/22   [name], MD  pravastatin (PRAVACHOL) 40 MG tablet TAKE ONE TABLET BY MOUTH EVERY EVENING Patient  taking differently: Take 40 mg by mouth every evening. 04/14/22   [name], MD  QUEtiapine (SEROQUEL) 50 MG tablet Take 50 mg by mouth in the morning and at bedtime.    [provider]  Semaglutide, 1 MG/DOSE, 4 MG/3ML SOPN Inject 1 mg as directed once a week. Patient taking differently: Inject 1 mg into the skin every Saturday. 04/20/22   [name], MD  spironolactone (ALDACTONE) 25 MG tablet TAKE 1/2 TABLET BY MOUTH ONCE DAILY Patient not taking: Reported on 08/23/2022 01/12/22   Christell Constant, MD  SYMBICORT 160-4.5 MCG/ACT inhaler INHALE TWO PUFFS BY MOUTH INTO LUNGS twice daily Patient taking differently: Inhale 2 puffs into the lungs 2 (two) times daily. 04/14/22   [name], MD  traMADol (ULTRAM) 50 MG tablet Take 1 tablet (50 mg total) by mouth every 12 (twelve) hours as needed. Patient taking differently: Take 50 mg by  mouth every 12 (twelve) hours as needed (for pain). 04/20/22   [name], MD  Vitamin D, Ergocalciferol, (DRISDOL) 1.25 MG (50000 UNIT) CAPS capsule Take 1 capsule (50,000 Units total) by mouth every 7 (seven) days. 07/21/22   [name], MD  warfarin (COUMADIN) 7.5 MG tablet TAKE ONE TABLET BY MOUTH DAILY 09/01/22   Marinda Elk, MD  zonisamide (ZONEGRAN) 100 MG capsule Take 100-200 mg by mouth See admin instructions. Take 100 mg by mouth in the morning and 200 mg in the evening 11/24/21   [provider]      Allergies    Lisinopril, Prednisone, Spironolactone, and Corticosteroids    Review of Systems   Review of Systems  Genitourinary:  Positive for flank pain.  All other systems reviewed and are negative.   Physical Exam Updated Vital Signs BP (!) 157/90 (BP Location: Left Arm)   Pulse 85   Temp 98.1 F (36.7 C) (Oral)   Resp 18   SpO2 97%  Physical Exam Vitals and nursing note reviewed.  Constitutional:      General: She is not in acute distress.    Appearance: Normal appearance. She is obese.      Comments: Chronically ill-appearing, nontoxic, nonseptic appearing  HENT:     Head: Normocephalic and atraumatic.  Eyes:     General:        Right eye: No discharge.        Left eye: No discharge.  Cardiovascular:     Rate and Rhythm: Normal rate and regular rhythm.     Heart sounds: No murmur heard.    No friction rub. No gallop.  Pulmonary:     Effort: Pulmonary effort is normal.     Breath sounds: Normal breath sounds.  Abdominal:     General: Bowel sounds are normal.     Palpations: Abdomen is soft.     Comments: Focally tender in the right lower quadrant, no rebound, rigidity, guarding  Skin:    General: Skin is warm and dry.     Capillary Refill: Capillary refill takes less than 2 seconds.  Neurological:     Mental Status: She is alert and oriented to person, place, and time.  Psychiatric:        Mood and Affect: Mood normal.        Behavior: Behavior normal.     ED Results / Procedures / Treatments   Labs (all labs ordered are listed, but only abnormal results are displayed) Labs Reviewed  LIPASE, BLOOD  COMPREHENSIVE METABOLIC PANEL  URINALYSIS, ROUTINE W REFLEX MICROSCOPIC  CBC WITH DIFFERENTIAL/PLATELET    EKG None  Radiology No results found.  Procedures Procedures  {Document cardiac monitor, telemetry assessment procedure when appropriate:1}  Medications Ordered in ED Medications  morphine (PF) 4 MG/ML injection 4 mg (has no administration in time range)    ED Course/ Medical Decision Making/ A&P   {   Click here for ABCD2, HEART and other calculatorsREFRESH Note before signing :1}                          Medical Decision Making Amount and/or Complexity of Data Reviewed Labs: ordered. Radiology: ordered.  Risk OTC drugs. Prescription drug management.   This patient is a 67 y.o. female  who presents to the ED for concern of ***.   Differential diagnoses prior to evaluation: The emergent differential diagnosis includes, but is not  limited to,  *** .  This is not an exhaustive differential.   Past Medical History / Co-morbidities / Social History: ***  Additional history: Chart reviewed. Pertinent results include: ***  Physical Exam: Physical exam performed. The pertinent findings include: ***  Lab Tests/Imaging studies: I personally interpreted labs/imaging and the pertinent results include:  ***. ***I agree with the radiologist interpretation.  Cardiac monitoring: EKG obtained and interpreted by my attending physician which shows: ***   Medications: I ordered medication including ***.  I have reviewed the patients home medicines and have made adjustments as needed.   Disposition: After consideration of the diagnostic results and the patients response to treatment, I feel that *** .   ***emergency department workup does not suggest an emergent condition requiring admission or immediate intervention beyond what has been performed at this time. The plan is: ***. The patient is safe for discharge and has been instructed to return immediately for worsening symptoms, change in symptoms or any other concerns.  Final Clinical Impression(s) / ED Diagnoses Final diagnoses:  None    Rx / DC Orders ED Discharge Orders     None

## 2022-09-02 NOTE — ED Triage Notes (Signed)
Pt arrived via [facility], from facility - [facility] for rehab, c/o RLQ abd pain since this morning. Endorses some foul smelling urine.

## 2022-09-02 NOTE — Discharge Instructions (Addendum)
Please use Tylenol for pain.  You may use 1000 mg of Tylenol every 6 hours.  Not to exceed 4 g of Tylenol within 24 hours.  Please take the entire course of magnesium, finish your antibiotics for pneumonia. Follow up closely with your PCP.

## 2022-09-02 NOTE — ED Notes (Signed)
Urine culture sent to main lab.  

## 2022-09-04 ENCOUNTER — Telehealth: Payer: Self-pay | Admitting: Oncology

## 2022-09-04 NOTE — Telephone Encounter (Signed)
Spoke with patient confirming upcoming appointment  

## 2022-09-06 ENCOUNTER — Encounter: Payer: Self-pay | Admitting: Family Medicine

## 2022-09-06 ENCOUNTER — Inpatient Hospital Stay: Payer: 59

## 2022-09-06 ENCOUNTER — Ambulatory Visit (INDEPENDENT_AMBULATORY_CARE_PROVIDER_SITE_OTHER): Payer: 59 | Admitting: Family Medicine

## 2022-09-06 VITALS — BP 138/82 | HR 70 | Temp 98.7°F | Wt 284.4 lb

## 2022-09-06 DIAGNOSIS — I5032 Chronic diastolic (congestive) heart failure: Secondary | ICD-10-CM

## 2022-09-06 DIAGNOSIS — J189 Pneumonia, unspecified organism: Secondary | ICD-10-CM | POA: Diagnosis not present

## 2022-09-06 DIAGNOSIS — E876 Hypokalemia: Secondary | ICD-10-CM | POA: Diagnosis not present

## 2022-09-06 DIAGNOSIS — R1031 Right lower quadrant pain: Secondary | ICD-10-CM

## 2022-09-06 DIAGNOSIS — Z7901 Long term (current) use of anticoagulants: Secondary | ICD-10-CM | POA: Diagnosis not present

## 2022-09-06 DIAGNOSIS — D500 Iron deficiency anemia secondary to blood loss (chronic): Secondary | ICD-10-CM

## 2022-09-06 DIAGNOSIS — K922 Gastrointestinal hemorrhage, unspecified: Secondary | ICD-10-CM

## 2022-09-06 DIAGNOSIS — M546 Pain in thoracic spine: Secondary | ICD-10-CM | POA: Diagnosis not present

## 2022-09-06 LAB — COMPREHENSIVE METABOLIC PANEL
ALT: 20 U/L (ref 0–35)
AST: 18 U/L (ref 0–37)
Albumin: 4.1 g/dL (ref 3.5–5.2)
Alkaline Phosphatase: 89 U/L (ref 39–117)
BUN: 10 mg/dL (ref 6–23)
CO2: 27 mEq/L (ref 19–32)
Calcium: 9.1 mg/dL (ref 8.4–10.5)
Chloride: 96 mEq/L (ref 96–112)
Creatinine, Ser: 0.83 mg/dL (ref 0.40–1.20)
GFR: 73.03 mL/min (ref >60.00)
Glucose, Bld: 105 mg/dL — ABNORMAL HIGH (ref 70–99)
Potassium: 3.8 mEq/L (ref 3.5–5.1)
Sodium: 131 mEq/L — ABNORMAL LOW (ref 135–145)
Total Bilirubin: 0.4 mg/dL (ref 0.2–1.2)
Total Protein: 8.3 g/dL (ref 6.0–8.3)

## 2022-09-06 LAB — CBC WITH DIFFERENTIAL/PLATELET
Basophils Absolute: 0 10*3/uL (ref 0.0–0.1)
Basophils Relative: 0.3 % (ref 0.0–3.0)
Eosinophils Absolute: 0.2 10*3/uL (ref 0.0–0.7)
Eosinophils Relative: 2 % (ref 0.0–5.0)
HCT: 34 % — ABNORMAL LOW (ref 36.0–46.0)
Hemoglobin: 11.2 g/dL — ABNORMAL LOW (ref 12.0–15.0)
Lymphocytes Relative: 21.2 % (ref 12.0–46.0)
Lymphs Abs: 1.6 10*3/uL (ref 0.7–4.0)
MCHC: 33 g/dL (ref 30.0–36.0)
MCV: 84.7 fl (ref 78.0–100.0)
Monocytes Absolute: 0.4 10*3/uL (ref 0.1–1.0)
Monocytes Relative: 5.9 % (ref 3.0–12.0)
Neutro Abs: 5.2 10*3/uL (ref 1.4–7.7)
Neutrophils Relative %: 70.6 % (ref 43.0–77.0)
Platelets: 503 10*3/uL — ABNORMAL HIGH (ref 150.0–400.0)
RBC: 4.01 Mil/uL (ref 3.87–5.11)
RDW: 16.9 % — ABNORMAL HIGH (ref 11.5–15.5)
WBC: 7.4 10*3/uL (ref 4.0–10.5)

## 2022-09-06 LAB — MAGNESIUM: Magnesium: 2 mg/dL (ref 1.5–2.5)

## 2022-09-06 LAB — PROTIME-INR
INR: 1 ratio (ref 0.8–1.0)
Prothrombin Time: 11.1 s (ref 9.6–13.1)

## 2022-09-06 MED ORDER — AZITHROMYCIN 250 MG PO TABS: ORAL_TABLET | ORAL | 0 refills | Status: DC

## 2022-09-06 MED ORDER — CEFDINIR 300 MG PO CAPS
300.0000 mg | ORAL_CAPSULE | Freq: Two times a day (BID) | ORAL | 0 refills | Status: AC
Start: 2022-09-06 — End: 2022-09-09

## 2022-09-06 MED ORDER — TRAMADOL HCL 50 MG PO TABS
50.0000 mg | ORAL_TABLET | Freq: Two times a day (BID) | ORAL | 1 refills | Status: DC | PRN
Start: 2022-09-06 — End: 2022-10-20

## 2022-09-06 NOTE — Patient Instructions (Signed)
[facility] [address], [place], [place] [address] Closes 5:30?PM Phone: [phone]  Call to set up a follow-up appointment with GI.

## 2022-09-06 NOTE — Progress Notes (Signed)
Established Patient Office Visit   Subjective  Patient ID: [name], female    DOB: Dec 13, 1955  Age: 67 y.o. MRN: [id number]  Chief Complaint  Patient presents with   Follow-up    Knot on rt side of stomach,sharp, shooting and throbbing pain that radiates down to right thigh. Started while was in hospital, doctor stated it was from kidney, waste going in but not coming out, another doctor stated it was pneumonia  from CT scan and feeling on side. Gave her some pain medication was given morphine on Saturday in ED at [facility]. States pain is bad   Pt accompanied by her daughter [name].  Patient is a 66 year old female seen today for follow-up on chronic conditions, acute concern, and HFU. Patient admitted 6/5-6/15 for dizziness/near syncope found to have UGIB with supratherapeutic INR.  Coumadin reversed.  Patient given 4u pRBCs with hemoglobin as low as 6.8.  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  Patient diagnosed with pneumonia, given ABX during stay.  Was to complete ABX course with 3 additional days of azithromycin and cefdinir at discharge.  Right adrenal nodule with slight increase in size from imaging several years ago, thought to be an adenoma.    Per patient's daughter she was not aware patient was being discharged to rehab.  States the facility was not planning and claimed to have no paperwork regarding pt.  Awaiting fax from facility regarding med list.  Patient does not think she is getting additional antibiotics.  Patient remained at the facility for less than a day as she was sent to the ED via EMS on 09/02/2022 for flank pain.  Patient states stools are still dark in color.  Denies abdominal pain, nausea, vomiting.    Past Medical History:  Diagnosis Date   Adrenal mass (HCC) 03/226   Benign   Arthritis    knees/multiple orthopedic conditons; lower back   Asthma    per pt   Clotting disorder (HCC)    +beta-2-glycoprotein IgA antibody    COPD (chronic obstructive pulmonary disease) (HCC)    inhalers dependent on environment   Depression    Diabetes mellitus    120s usually fasting -  dx more than 10 yrs ago   Dizziness, nonspecific    DVT (deep venous thrombosis) (HCC)    Recurrent   Fibromyalgia    GERD (gastroesophageal reflux disease)    Heart murmur    History of blood transfusion    History of cocaine abuse (HCC)    Remote history    Hyperlipidemia    Hypertension    takes meds daily   Hypothyroidism    Lupus Anticoagulant Positive    On home oxygen therapy    at night   Shortness of breath    exertion or lying flat   Sleep apnea    2l of oxygen at night (as of 12/6, she used to)   Past Surgical History:  Procedure Laterality Date   ABDOMINAL HYSTERECTOMY  1989   Fibroids   ANTERIOR LUMBAR FUSION  01/03/2012   Procedure: ANTERIOR LUMBAR FUSION 1 LEVEL;  Surgeon: Carmela Hurt, MD;  Location: [facility];  Service: Neurosurgery;  Laterality: N/A;  Lumbar Four-Five Anterior Lumbar Interbody Fusion with Instrumentation   BACK SURGERY     cervical spine---disk disease   CARPAL TUNNEL RELEASE     Bilateral   CESAREAN SECTION     CHOLECYSTECTOMY     CYSTOSCOPY/RETROGRADE/URETEROSCOPY/STONE EXTRACTION  WITH BASKET Right 04/26/2014   Procedure: CYSTOSCOPY/ RIGHT RETROGRADE/ RIGHT URETEROSCOPY/URETERAL AND RENAL PELVIS BIOPSY;  Surgeon: Sebastian Ache, MD;  Location: [facility];  Service: Urology;  Laterality: Right;   ESOPHAGOGASTRODUODENOSCOPY (EGD) WITH PROPOFOL N/A 08/25/2022   Procedure: ESOPHAGOGASTRODUODENOSCOPY (EGD) WITH PROPOFOL;  Surgeon: Jeani Hawking, MD;  Location: [facility];  Service: Gastroenterology;  Laterality: N/A;   gallstones removed     HEMOSTASIS CLIP PLACEMENT  08/25/2022   Procedure: HEMOSTASIS CLIP PLACEMENT;  Surgeon: Jeani Hawking, MD;  Location: [facility];  Service: Gastroenterology;;   HOT HEMOSTASIS N/A 08/25/2022   Procedure: HOT HEMOSTASIS (ARGON PLASMA COAGULATION/BICAP);   Surgeon: Jeani Hawking, MD;  Location: [facility];  Service: Gastroenterology;  Laterality: N/A;   POLYPECTOMY  08/25/2022   Procedure: POLYPECTOMY;  Surgeon: Jeani Hawking, MD;  Location: [facility];  Service: Gastroenterology;;   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   bilateral   right elbow surgery     RIGHT HEART CATH N/A 02/28/2021   Procedure: RIGHT HEART CATH;  Surgeon: Kathleene Hazel, MD;  Location: [facility] IN[facility]AS[facility] CV LAB;  Service: Cardiovascular;  Laterality: N/A;   THYROID LOBECTOMY Right 02/27/2017   THYROID LOBECTOMY Right 02/27/2017   Procedure: RIGHT THYROID LOBECTOMY;  Surgeon: Harriette Bouillon, MD;  Location: [facility] OR;  Service: General;  Laterality: Right;   TUBAL LIGATION     Social History   Tobacco Use   Smoking status: Never   Smokeless tobacco: Never  Vaping Use   Vaping Use: Never used  Substance Use Topics   Alcohol use: No    Alcohol/week: 0.0 standard drinks of alcohol    Comment: beer in past    Drug use: Yes    Types: Cocaine    Comment: quit 2003-rehab progam in 2005   Family History  Problem Relation Age of Onset   Hypertension Mother    Diabetes Mother    Cancer Mother        Pancreatic   Hypertension Father    Heart failure Father    Hyperlipidemia Other    COPD Other    Allergies  Allergen Reactions   Lisinopril Anaphylaxis, Swelling and Other (See Comments)    THROAT SWELLING  Pt states her throat started to "close up" after being on it for "so long."  THROAT SWELLING, Pt states her throat started to "close up" after being on it for "so long."   Prednisone Anaphylaxis, Swelling and Other (See Comments)    Throat swelling and tongue irritation   Spironolactone Other (See Comments)    Caused stroke-like symptoms with the mouth   Corticosteroids Rash    States she developed a rash on her tongue after a steroid injection in her back      ROS Negative unless stated above    Objective:     BP 138/82 (BP Location: Left Arm,  Patient Position: Sitting, Cuff Size: Large)   Pulse 70   Temp 98.7 F (37.1 C) (Oral)   Wt 284 lb 6.4 oz (129 kg)   SpO2 96%   BMI 45.90 kg/m  BP Readings from Last 3 Encounters:  09/06/22 138/82  09/02/22 (!) 176/85  09/01/22 (!) 158/83   Wt Readings from Last 3 Encounters:  09/06/22 284 lb 6.4 oz (129 kg)  08/23/22 281 lb 15.5 oz (127.9 kg)  07/29/22 282 lb (127.9 kg)      Physical Exam Constitutional:      General: She is not in acute distress.    Appearance: Normal appearance.  HENT:  Head: Normocephalic and atraumatic.     Nose: Nose normal.     Mouth/Throat:     Mouth: Mucous membranes are moist.  Cardiovascular:     Rate and Rhythm: Normal rate and regular rhythm.     Heart sounds: Normal heart sounds. No murmur heard.    No gallop.  Pulmonary:     Effort: Pulmonary effort is normal. No respiratory distress.     Breath sounds: Normal breath sounds. No wheezing, rhonchi or rales.  Abdominal:     General: Abdomen is protuberant. Bowel sounds are normal.     Palpations: Abdomen is soft.     Tenderness: There is abdominal tenderness.     Hernia: A hernia is present. Hernia is present in the umbilical area.       Comments: TT deep palpation right inguinal area.  Skin:    General: Skin is warm and dry.  Neurological:     Mental Status: She is alert and oriented to person, place, and time.      No results found for any visits on 09/06/22.    Assessment & Plan:  UGIB (upper gastrointestinal bleed) -     CBC with Differential/Platelet  Iron deficiency anemia due to chronic blood loss -     CBC with Differential/Platelet  Hypokalemia -     Comprehensive metabolic panel  Hypomagnesemia -     Magnesium  Right inguinal pain -     Comprehensive metabolic panel  Community acquired pneumonia of left lower lobe of lung -     CBC with Differential/Platelet -     Azithromycin; Take 1 tab daily  Dispense: 3 each; Refill: 0 -     Cefdinir; Take 1  capsule (300 mg total) by mouth 2 (two) times daily for 3 days.  Dispense: 6 capsule; Refill: 0  Chronic anticoagulation -     Protime-INR  Chronic diastolic CHF (congestive heart failure) (HCC)  Acute right-sided thoracic back pain -     traMADol HCl; Take 1 tablet (50 mg total) by mouth every 12 (twelve) hours as needed (for pain).  Dispense: 60 tablet; Refill: 1   Extensive chart review from patient's hospitalization and ED visit including notes, labs, imaging.  SNF and oncology also contacted while patient was in clinic as patient had a lab appointment with oncology at 12 but was still in clinic. Patient given follow-up information to schedule appointment with GI.  Will repeat labs this visit Awaiting fax from SNF/rehab regarding medications given. Completed ABX course for CAP.  May affect INR. Right inguinal pain and likely 2/2 musculoskeletal from pt leaning to right side while in hospital.  Imaging reviewed and negative. CHF stable.  Patient euvolemic Replete electrolytes as needed Given strict precautions for S/S of GI bleeding   On day of service, over 55 minutes spent caring for this patient face-to-face, reviewing the chart, counseling and/or coordinating care for plan and treatment of diagnosis below.    Return in about 6 weeks (around 10/18/2022).   [name], MD

## 2022-09-07 ENCOUNTER — Telehealth: Payer: Self-pay | Admitting: *Deleted

## 2022-09-07 ENCOUNTER — Inpatient Hospital Stay: Payer: 59 | Attending: Oncology

## 2022-09-07 ENCOUNTER — Telehealth: Payer: Self-pay

## 2022-09-07 DIAGNOSIS — I82403 Acute embolism and thrombosis of unspecified deep veins of lower extremity, bilateral: Secondary | ICD-10-CM

## 2022-09-07 DIAGNOSIS — Z7901 Long term (current) use of anticoagulants: Secondary | ICD-10-CM | POA: Diagnosis not present

## 2022-09-07 DIAGNOSIS — D649 Anemia, unspecified: Secondary | ICD-10-CM

## 2022-09-07 DIAGNOSIS — Z86718 Personal history of other venous thrombosis and embolism: Secondary | ICD-10-CM | POA: Insufficient documentation

## 2022-09-07 LAB — PROTIME-INR
INR: 1.1 (ref 0.8–1.2)
Prothrombin Time: 13.9 seconds (ref 11.4–15.2)

## 2022-09-07 LAB — CBC WITH DIFFERENTIAL (CANCER CENTER ONLY)
Abs Immature Granulocytes: 0.02 10*3/uL (ref 0.00–0.07)
Basophils Absolute: 0 10*3/uL (ref 0.0–0.1)
Basophils Relative: 0 %
Eosinophils Absolute: 0.1 10*3/uL (ref 0.0–0.5)
Eosinophils Relative: 2 %
HCT: 33.6 % — ABNORMAL LOW (ref 36.0–46.0)
Hemoglobin: 11 g/dL — ABNORMAL LOW (ref 12.0–15.0)
Immature Granulocytes: 0 %
Lymphocytes Relative: 20 %
Lymphs Abs: 1.4 10*3/uL (ref 0.7–4.0)
MCH: 27.6 pg (ref 26.0–34.0)
MCHC: 32.7 g/dL (ref 30.0–36.0)
MCV: 84.2 fL (ref 80.0–100.0)
Monocytes Absolute: 0.5 10*3/uL (ref 0.1–1.0)
Monocytes Relative: 7 %
Neutro Abs: 4.8 10*3/uL (ref 1.7–7.7)
Neutrophils Relative %: 71 %
Platelet Count: 474 10*3/uL — ABNORMAL HIGH (ref 150–400)
RBC: 3.99 MIL/uL (ref 3.87–5.11)
RDW: 16.8 % — ABNORMAL HIGH (ref 11.5–15.5)
WBC Count: 6.7 10*3/uL (ref 4.0–10.5)
nRBC: 0 % (ref 0.0–0.2)

## 2022-09-07 LAB — SAMPLE TO BLOOD BANK

## 2022-09-07 NOTE — Telephone Encounter (Signed)
She reports she has taken 7.5 mg beginning soon after her hospital d/c. Per Dr. Truett Perna: Continue warfarin 7.5 mg daily and recheck in 1 week.  Patient aware and lab scheduled.

## 2022-09-07 NOTE — Telephone Encounter (Signed)
INR is 1.1. Left VM requesting return call with date of last dose of warfarin and what dose she took. Also sent MyChart message.

## 2022-09-07 NOTE — Telephone Encounter (Signed)
Transition Care Management Follow-up Telephone Call Date of discharge and from where: 09/02/2022 [name] How have you been since you were released from the hospital? Patient is feeling better Any questions or concerns? No  Items Reviewed: Did the pt receive and understand the discharge instructions provided? Yes  Medications obtained and verified? Yes  Other? No  Any new allergies since your discharge? No  Dietary orders reviewed? Yes Do you have support at home? Yes   Follow up appointments reviewed:  PCP Hospital f/u appt confirmed? Yes  Scheduled to see Colleen Hidden B. Truett Perna, MD on 09/06/2022 @ [facility] at [place]. Specialist Hospital f/u appt confirmed? No  Scheduled to see  on  @ . Are transportation arrangements needed?  Patient has transportation benefit with [employer]. Verified patient's email address to send information. If their condition worsens, is the pt aware to call PCP or go to the Emergency Dept.? Yes Was the patient provided with contact information for the PCP's office or ED? Yes Was to pt encouraged to call back with questions or concerns? Yes  [name] [name]  [facility] Community Resource Care Guide   ??[email or web address]  ?? [phone]   Website: [email or web address]  [email or web address]

## 2022-09-08 ENCOUNTER — Ambulatory Visit (INDEPENDENT_AMBULATORY_CARE_PROVIDER_SITE_OTHER): Payer: 59 | Admitting: Podiatry

## 2022-09-08 NOTE — Progress Notes (Signed)
Patient presents with a family member today for a diabetic shoe fitting and dispensing today.  Initial review of the shoes she stated that these were the pair that she had selected.  Upon fitting the patient for the shoes, she did have moderate swelling on the dorsal foot today.  She usually takes a diuretic but had not taken it today but notes that she usually has some issues with swelling on the top of her foot.  Upon standing with the shoes on and with the diabetic insoles inside of them, the vamp of the shoe was cutting into her skin where she has swelling and this was quite uncomfortable for her.  The length of the shoe needs to go up to one half size due to her toes being at the very end of the shoe today.  Will include pictures of the current black Tinika-Jane style shoes that she tried on today.  She did see a pair of shoes that she liked however I am unsure this will be a good fit due to her dorsal foot swelling issues.  She may be a better candidate for an extra extra-depth shoe or shoe with moderate mesh or stretch along the dorsal vamp.  Informed patient that our diabetic shoe consultant will be in touch with her to explore other options and styles to order.   Current style - 1/2-size too short and upper vamp cuts into her swollen dorsal feet.    She is wondering if the shoe in the photo above would be a good fit for her.

## 2022-09-11 ENCOUNTER — Telehealth: Payer: Self-pay | Admitting: Podiatry

## 2022-09-11 ENCOUNTER — Other Ambulatory Visit (INDEPENDENT_AMBULATORY_CARE_PROVIDER_SITE_OTHER): Payer: 59

## 2022-09-11 ENCOUNTER — Other Ambulatory Visit: Payer: Self-pay

## 2022-09-11 DIAGNOSIS — E871 Hypo-osmolality and hyponatremia: Secondary | ICD-10-CM

## 2022-09-11 LAB — COMPREHENSIVE METABOLIC PANEL
ALT: 21 U/L (ref 0–35)
AST: 25 U/L (ref 0–37)
Albumin: 3.9 g/dL (ref 3.5–5.2)
Alkaline Phosphatase: 76 U/L (ref 39–117)
BUN: 11 mg/dL (ref 6–23)
CO2: 28 mEq/L (ref 19–32)
Calcium: 8.9 mg/dL (ref 8.4–10.5)
Chloride: 102 mEq/L (ref 96–112)
Creatinine, Ser: 0.76 mg/dL (ref 0.40–1.20)
GFR: 81.17 mL/min (ref >60.00)
Glucose, Bld: 105 mg/dL — ABNORMAL HIGH (ref 70–99)
Potassium: 3.8 mEq/L (ref 3.5–5.1)
Sodium: 136 mEq/L (ref 135–145)
Total Bilirubin: 0.3 mg/dL (ref 0.2–1.2)
Total Protein: 7.7 g/dL (ref 6.0–8.3)

## 2022-09-11 NOTE — Addendum Note (Signed)
Addended by: Elwin Mocha on: 09/11/2022 09:56 AM   Modules accepted: Orders

## 2022-09-11 NOTE — Telephone Encounter (Signed)
Left message for patient to call back about picking a different selection on shoes .    [name]

## 2022-09-11 NOTE — Progress Notes (Signed)
Spoke with pt today about order new pair of shoes she really wants the Germaine Pomfret style shoe,  so I ordered her a600w which is a mesh maryjane shoe in 10.5 xw

## 2022-09-14 ENCOUNTER — Inpatient Hospital Stay: Payer: 59

## 2022-09-14 DIAGNOSIS — Z7901 Long term (current) use of anticoagulants: Secondary | ICD-10-CM | POA: Diagnosis not present

## 2022-09-14 DIAGNOSIS — Z86718 Personal history of other venous thrombosis and embolism: Secondary | ICD-10-CM | POA: Diagnosis not present

## 2022-09-14 DIAGNOSIS — I82403 Acute embolism and thrombosis of unspecified deep veins of lower extremity, bilateral: Secondary | ICD-10-CM

## 2022-09-14 LAB — PROTIME-INR
INR: 1.1 (ref 0.8–1.2)
Prothrombin Time: 14.4 seconds (ref 11.4–15.2)

## 2022-09-15 ENCOUNTER — Telehealth: Payer: Self-pay

## 2022-09-15 NOTE — Telephone Encounter (Signed)
-----   Message from Ladene Artist, MD sent at 09/14/2022  4:38 PM EDT ----- Please call patient if she taking Coumadin, any other new medications,

## 2022-09-15 NOTE — Telephone Encounter (Signed)
The patient reported that she is currently taking Coumadin as prescribed: 10mg  on Monday, Wednesday, and Friday, and 7.5mg  on the other days. She also mentioned that she has not initiated any new medications.

## 2022-09-22 ENCOUNTER — Ambulatory Visit (INDEPENDENT_AMBULATORY_CARE_PROVIDER_SITE_OTHER): Payer: 59 | Admitting: Podiatry

## 2022-09-22 DIAGNOSIS — E1142 Type 2 diabetes mellitus with diabetic polyneuropathy: Secondary | ICD-10-CM

## 2022-09-22 DIAGNOSIS — M2141 Flat foot [pes planus] (acquired), right foot: Secondary | ICD-10-CM

## 2022-09-22 DIAGNOSIS — M2142 Flat foot [pes planus] (acquired), left foot: Secondary | ICD-10-CM

## 2022-09-22 DIAGNOSIS — M2042 Other hammer toe(s) (acquired), left foot: Secondary | ICD-10-CM

## 2022-09-22 NOTE — Progress Notes (Unsigned)
Patient presents today to pick up diabetic shoes and insoles.  Patient was dispensed 1 pair of diabetic shoes and 3 pairs of foam casted diabetic insoles. Fit was unsatisfactory. Reorder shoes in 10xxw Instructions for break-in and wear was reviewed and a copy was given to the patient.   Re-appointment for regularly scheduled diabetic foot care visits or if they should experience any trouble with the shoes or insoles.

## 2022-09-29 IMAGING — CR DG CHEST 2V
2 series · 2 of 2 positions shown · non-contrast
Comparison: 02/02/2020

CLINICAL DATA: Chest pain

EXAM:
CHEST - 2 VIEW

[chest lat]
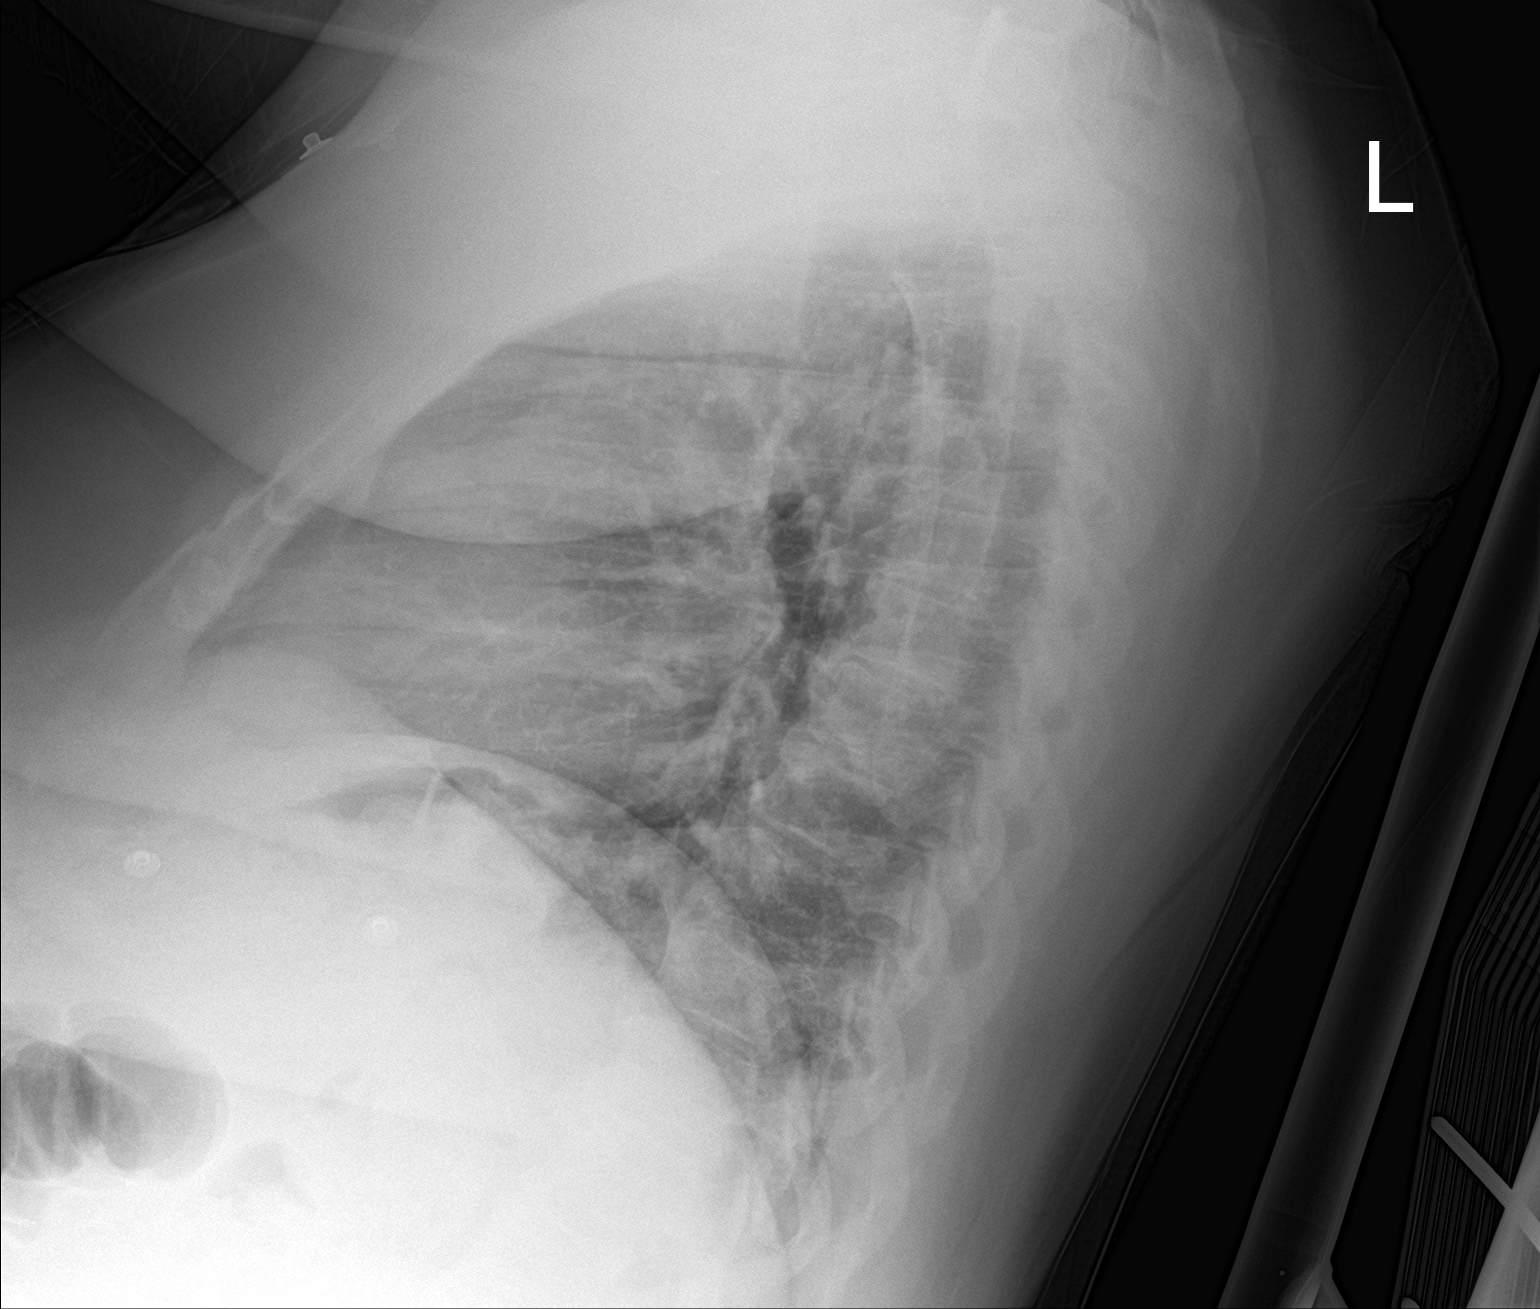

[chest ap]
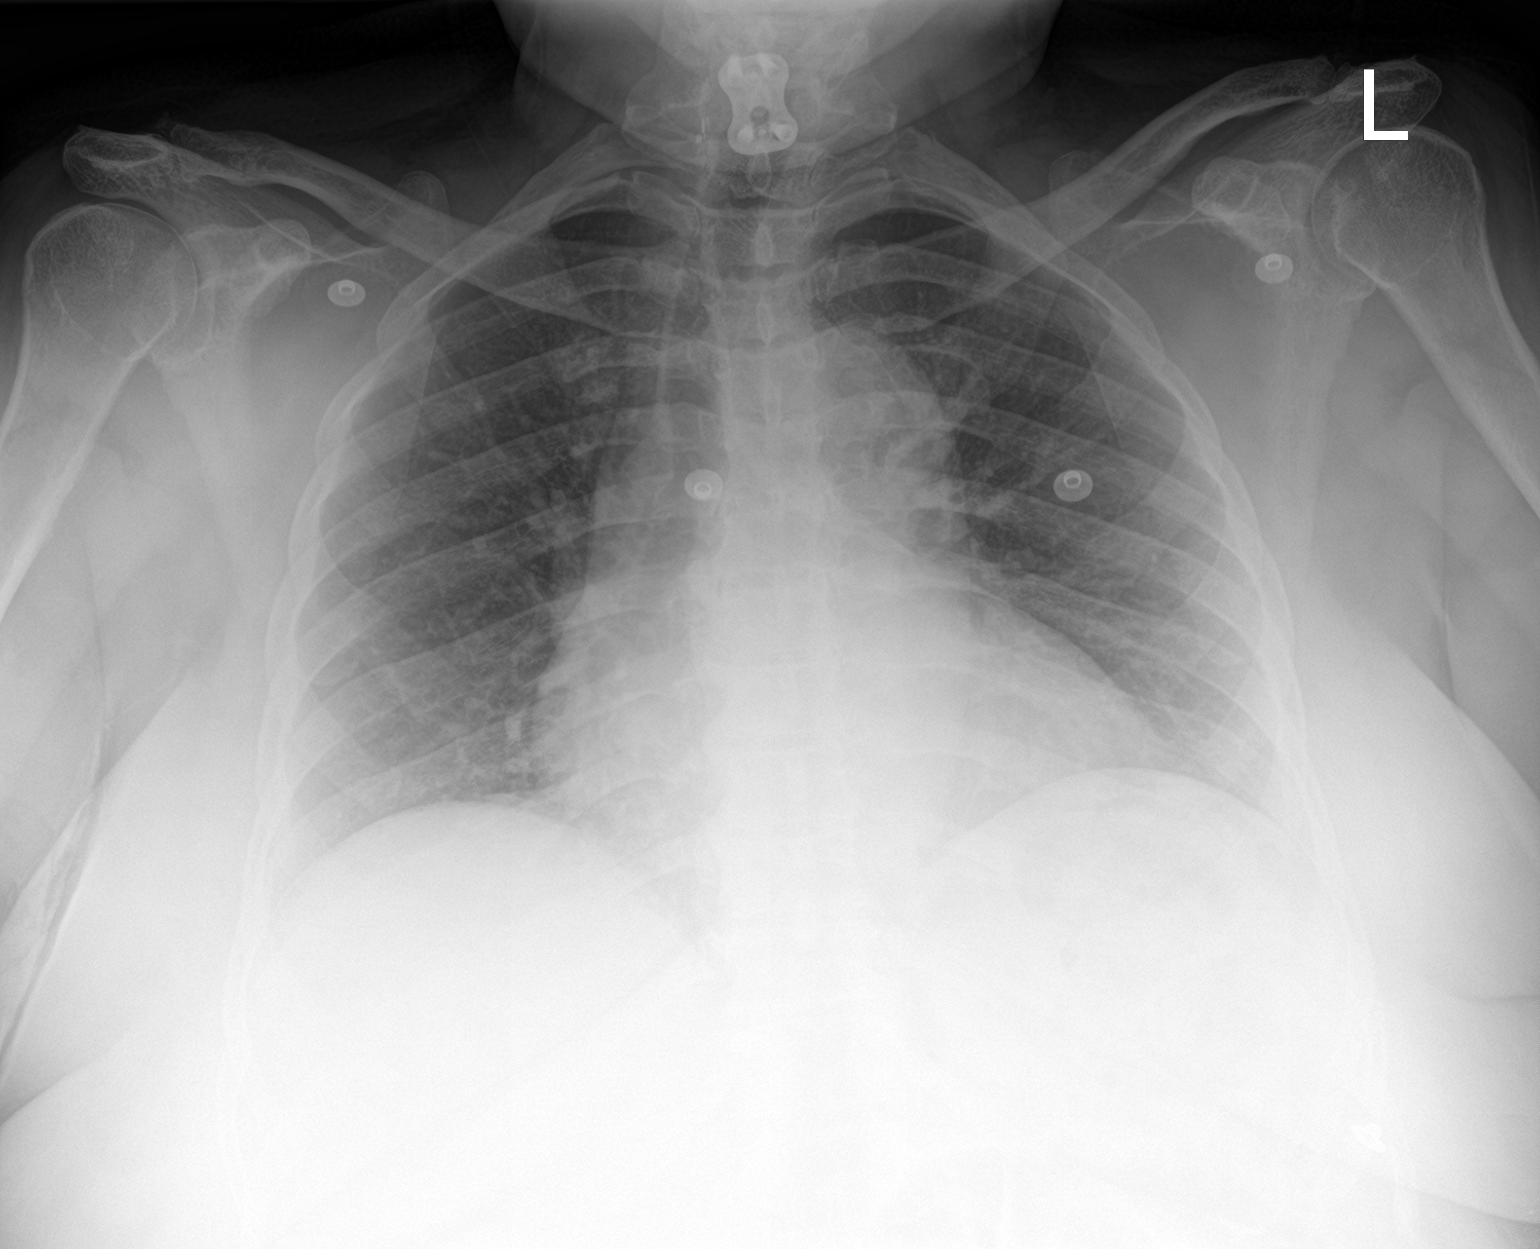

[2 of 2 positions shown; findings below may reference images not displayed]

FINDINGS: Surgical hardware in the cervical spine. No focal opacity or pleural
effusion. Borderline to mild cardiomegaly. No pneumothorax
IMPRESSION: Borderline to mild cardiomegaly.

## 2022-10-08 IMAGING — DX DG SHOULDER 2+V*L*
3 series · 3 of 3 positions shown · non-contrast
Comparison: None.

CLINICAL DATA: L shouder pain with decreased ROM

EXAM:
LEFT SHOULDER - 2+ VIEW

[shoulder internal rotation ap]
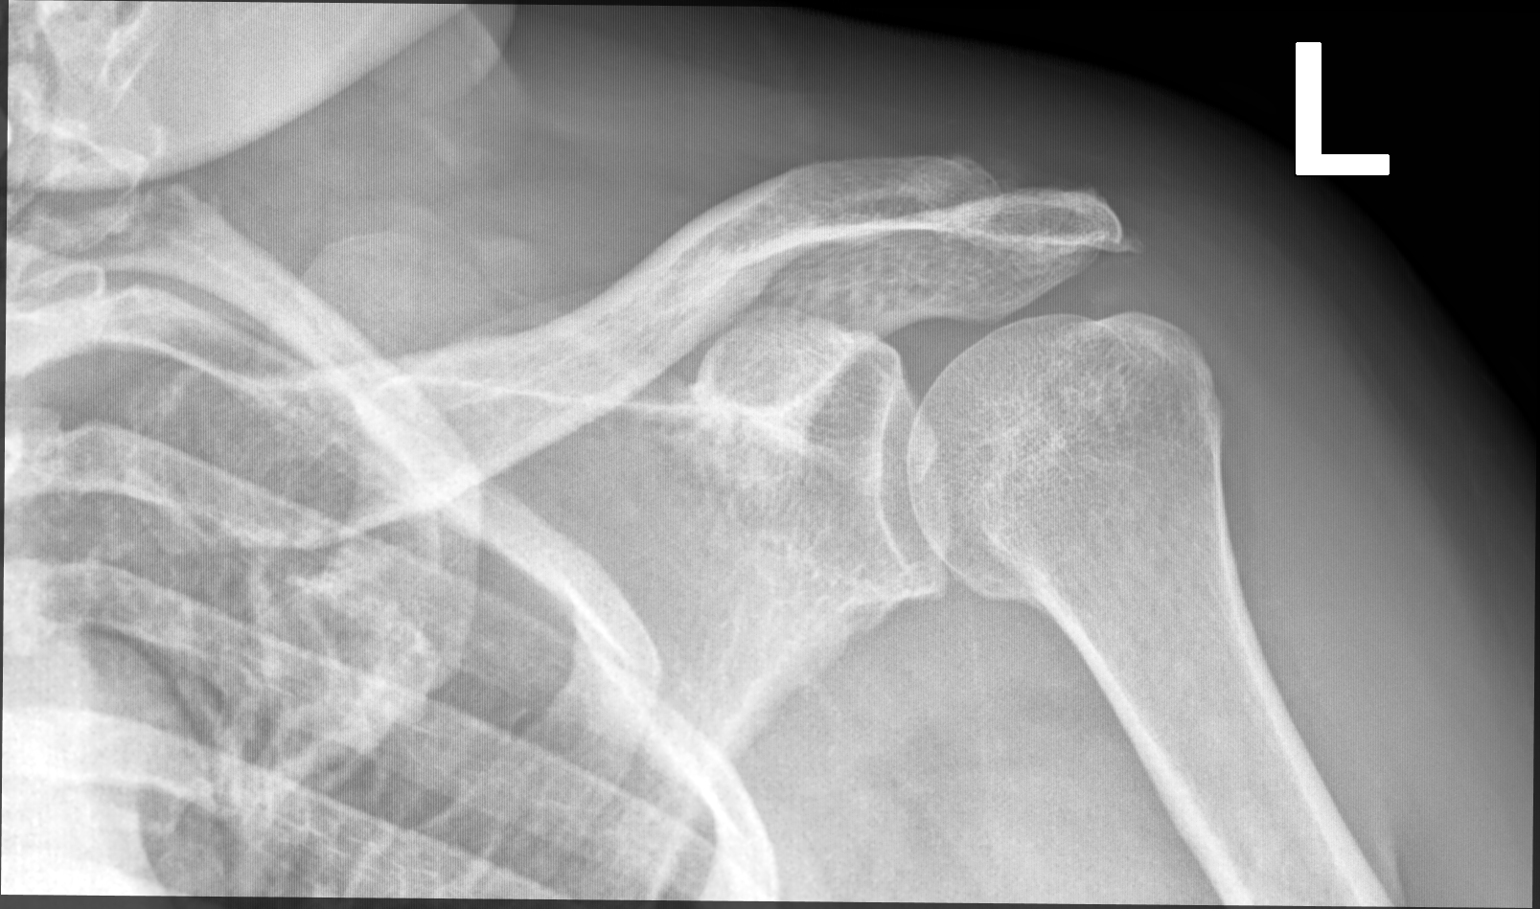

[shoulder (y view)]
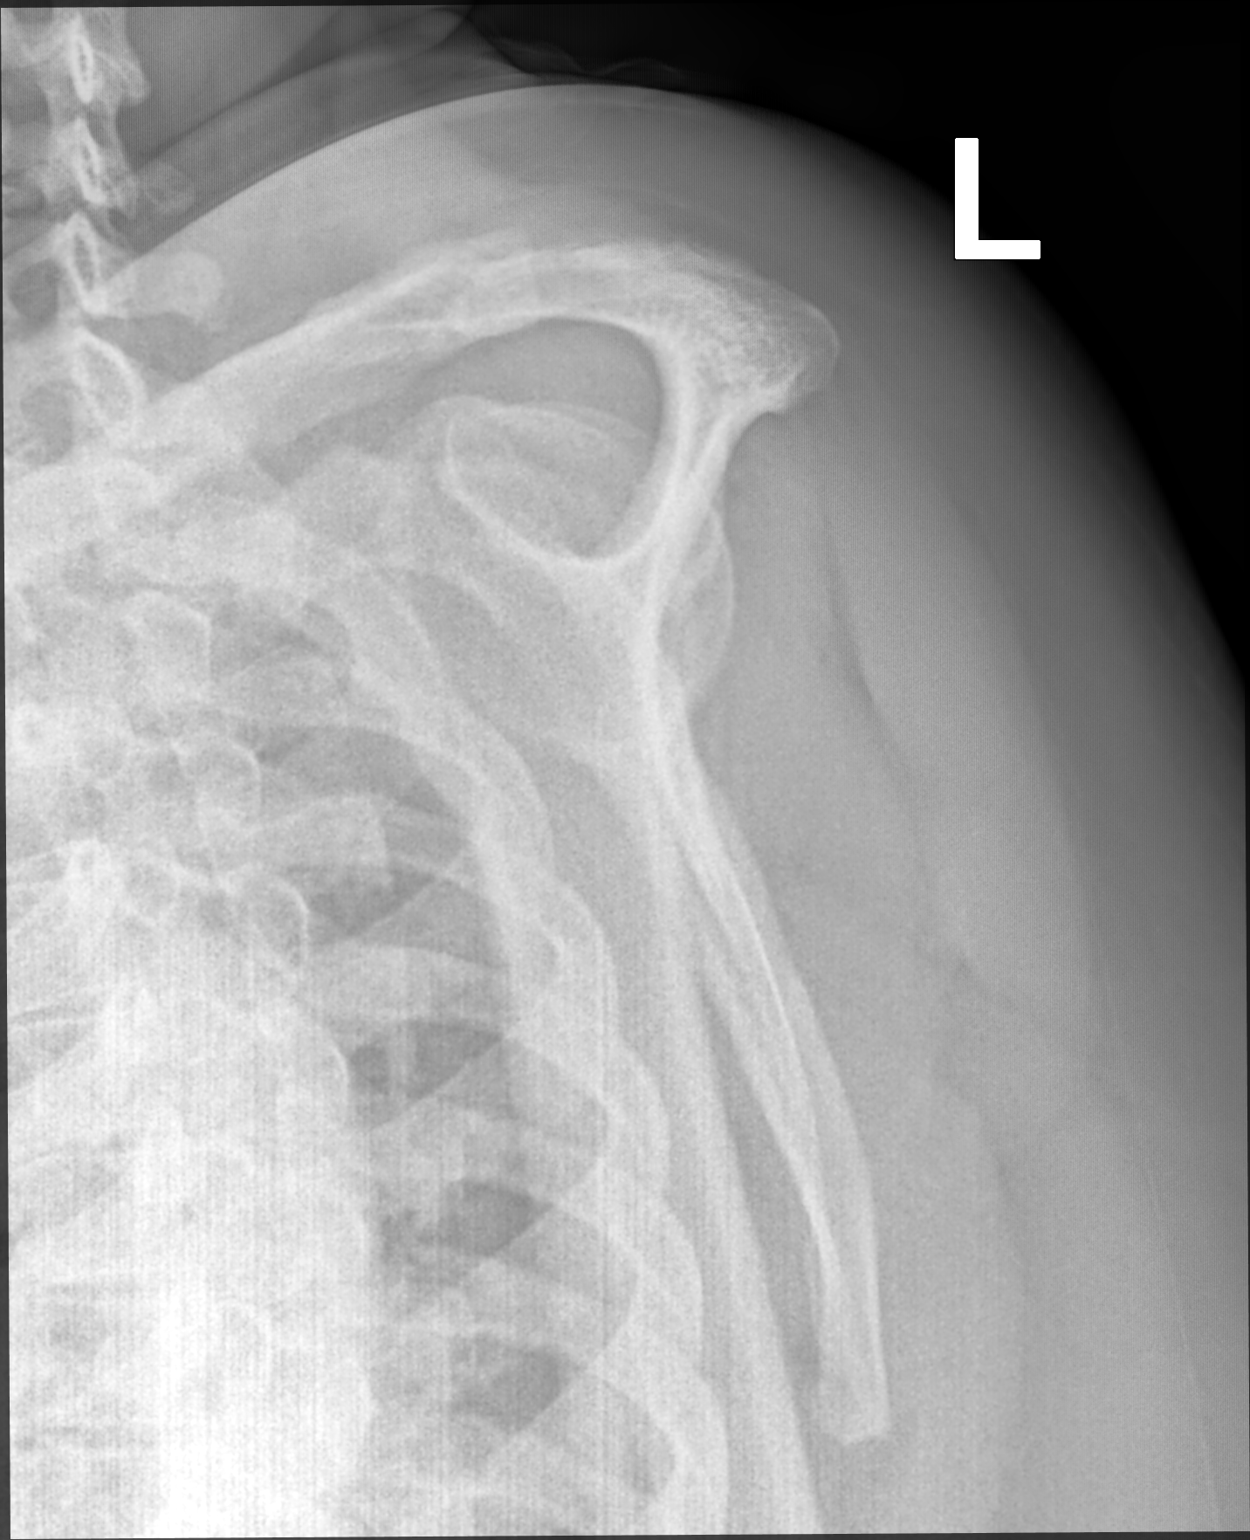

[shoulder (axial)]
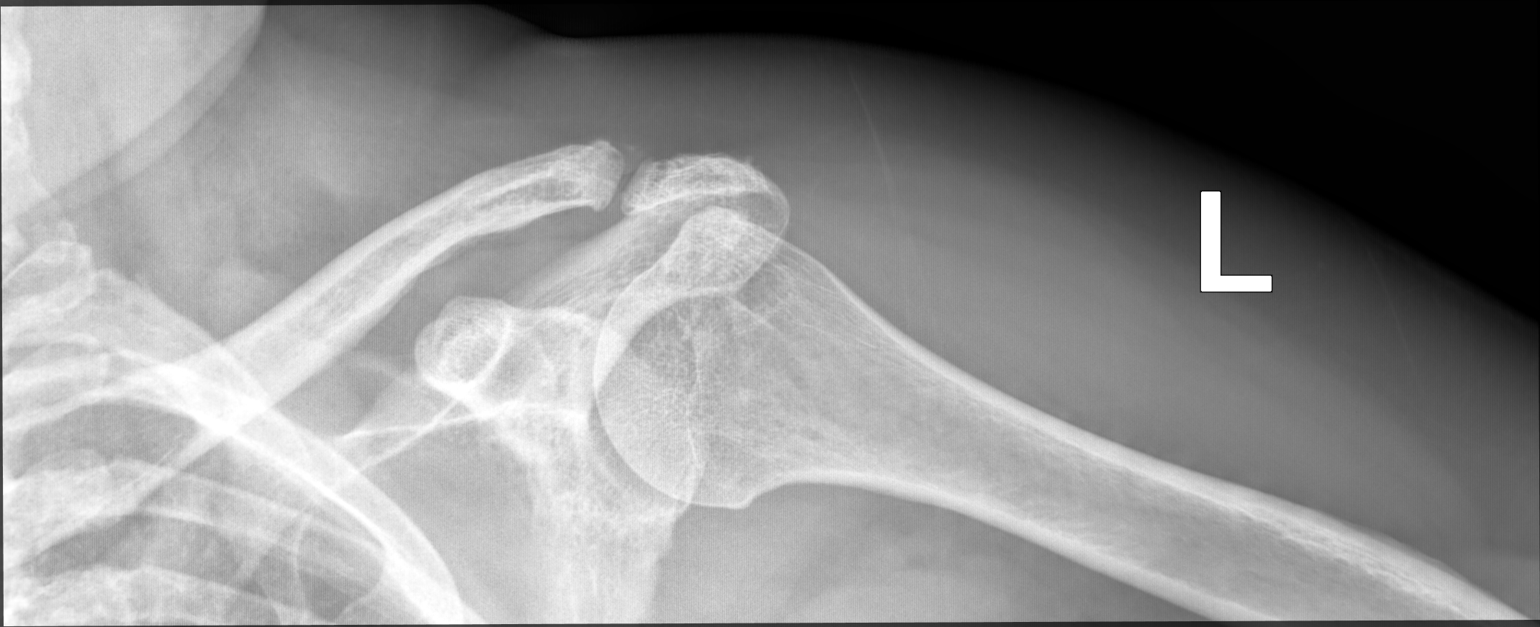

[3 of 3 positions shown; findings below may reference images not displayed]

FINDINGS: Physiologic alignment with approximation of the joints. No fracture
or focal osseous lesion. Glenohumeral and acromioclavicular
degenerative spurring with mild joint space loss. Soft tissues are
within normal limits.
IMPRESSION: No acute osseous abnormality. Mild glenohumeral and
acromioclavicular osteoarthrosis.

## 2022-10-11 ENCOUNTER — Telehealth: Payer: Self-pay | Admitting: Podiatry

## 2022-10-11 NOTE — Telephone Encounter (Signed)
Left message on vm for patient to call back to pick up diabetic shoes

## 2022-10-12 ENCOUNTER — Ambulatory Visit: Payer: 59 | Attending: Internal Medicine | Admitting: Internal Medicine

## 2022-10-12 ENCOUNTER — Encounter: Payer: Self-pay | Admitting: Internal Medicine

## 2022-10-12 VITALS — BP 120/80 | HR 88 | Ht 66.0 in | Wt 284.0 lb

## 2022-10-12 DIAGNOSIS — Z7984 Long term (current) use of oral hypoglycemic drugs: Secondary | ICD-10-CM | POA: Diagnosis not present

## 2022-10-12 DIAGNOSIS — J449 Chronic obstructive pulmonary disease, unspecified: Secondary | ICD-10-CM | POA: Diagnosis not present

## 2022-10-12 DIAGNOSIS — E1159 Type 2 diabetes mellitus with other circulatory complications: Secondary | ICD-10-CM

## 2022-10-12 DIAGNOSIS — G4733 Obstructive sleep apnea (adult) (pediatric): Secondary | ICD-10-CM | POA: Diagnosis not present

## 2022-10-12 DIAGNOSIS — I471 Supraventricular tachycardia, unspecified: Secondary | ICD-10-CM | POA: Diagnosis not present

## 2022-10-12 DIAGNOSIS — I10 Essential (primary) hypertension: Secondary | ICD-10-CM

## 2022-10-12 DIAGNOSIS — E782 Mixed hyperlipidemia: Secondary | ICD-10-CM | POA: Diagnosis not present

## 2022-10-12 DIAGNOSIS — E1169 Type 2 diabetes mellitus with other specified complication: Secondary | ICD-10-CM | POA: Diagnosis not present

## 2022-10-12 DIAGNOSIS — E119 Type 2 diabetes mellitus without complications: Secondary | ICD-10-CM

## 2022-10-12 DIAGNOSIS — E669 Obesity, unspecified: Secondary | ICD-10-CM

## 2022-10-12 DIAGNOSIS — I152 Hypertension secondary to endocrine disorders: Secondary | ICD-10-CM | POA: Diagnosis not present

## 2022-10-12 DIAGNOSIS — I5032 Chronic diastolic (congestive) heart failure: Secondary | ICD-10-CM | POA: Diagnosis not present

## 2022-10-12 NOTE — Progress Notes (Signed)
Cardiology Office Note:    Date:  10/12/2022   ID:  [name], DOB 1956-02-23, MRN [id number]  PCP:  [name], MD  [facility] Cardiologist:  Riley Lam MD [facility] Electrophysiologist:  None   CC: SOB f/u  History of Present Illness:    [name] is a 67 y.o. 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  2021: had ziopatch with no concerning findings.   2022:  patient CCTA and Echo- notable from improved LAP and aortic atherosclerosis, and a hiatal hernia. Had diuretics with RHC showing that she was euvolemic; still have SOB 2023:  Dyspnea related to Morbid Obesity, Asthma, and OSA with CPAP intolerance now with a better CPAP and new adherence  Patient notes that she is doing fine.   Since last visit notes that she had GI bleeding required from polyps and coumadin. Had three ED visits.  08/24/22: INR was 7.  Echo was normal  No chest pain or pressure .  No SOB/DOE and no PND/Orthopnea.  No weight gain or leg swelling.  No palpitations or syncope.  Notes fatigue.  Is hanging hand pain; BP without pain is 130/70.  Eats Meals on wheels.  Ambulatory blood pressure not done.   Past Medical History:  Diagnosis Date   Adrenal mass (HCC) 03/226   Benign   Arthritis    knees/multiple orthopedic conditons; lower back   Asthma    per pt   Clotting disorder (HCC)    +beta-2-glycoprotein IgA antibody   COPD (chronic obstructive pulmonary disease) (HCC)    inhalers dependent on environment   Depression    Diabetes mellitus    120s usually fasting -  dx more than 10 yrs ago   Dizziness, nonspecific    DVT (deep venous thrombosis) (HCC)    Recurrent   Fibromyalgia    GERD (gastroesophageal reflux disease)    Heart murmur    History of blood transfusion    History of cocaine abuse (HCC)     Remote history    Hyperlipidemia    Hypertension    takes meds daily   Hypothyroidism    Lupus Anticoagulant Positive    On home oxygen therapy    at night   Shortness of breath    exertion or lying flat   Sleep apnea    2l of oxygen at night (as of 12/6, she used to)    Past Surgical History:  Procedure Laterality Date   ABDOMINAL HYSTERECTOMY  1989   Fibroids   ANTERIOR LUMBAR FUSION  01/03/2012   Procedure: ANTERIOR LUMBAR FUSION 1 LEVEL;  Surgeon: Carmela Hurt, MD;  Location: [facility];  Service: Neurosurgery;  Laterality: N/A;  Lumbar Four-Five Anterior Lumbar Interbody Fusion with Instrumentation   BACK SURGERY     cervical spine---disk disease   CARPAL TUNNEL RELEASE     Bilateral   CESAREAN SECTION     CHOLECYSTECTOMY     CYSTOSCOPY/RETROGRADE/URETEROSCOPY/STONE EXTRACTION WITH BASKET Right 04/26/2014   Procedure: CYSTOSCOPY/ RIGHT RETROGRADE/ RIGHT URETEROSCOPY/URETERAL AND RENAL PELVIS BIOPSY;  Surgeon: Sebastian Ache, MD;  Location: [facility]S;  Service: Urology;  Laterality: Right;   ESOPHAGOGASTRODUODENOSCOPY (EGD) WITH PROPOFOL N/A 08/25/2022   Procedure: ESOPHAGOGASTRODUODENOSCOPY (EGD) WITH PROPOFOL;  Surgeon: Jeani Hawking, MD;  Location: [facility] ENDOS[facility];  Service: Gastroenterology;  Laterality: N/A;   gallstones  removed     HEMOSTASIS CLIP PLACEMENT  08/25/2022   Procedure: HEMOSTASIS CLIP PLACEMENT;  Surgeon: Jeani Hawking, MD;  Location: [facility];  Service: Gastroenterology;;   HOT HEMOSTASIS N/A 08/25/2022   Procedure: HOT HEMOSTASIS (ARGON PLASMA COAGULATION/BICAP);  Surgeon: Jeani Hawking, MD;  Location: [facility];  Service: Gastroenterology;  Laterality: N/A;   POLYPECTOMY  08/25/2022   Procedure: POLYPECTOMY;  Surgeon: Jeani Hawking, MD;  Location: [facility];  Service: Gastroenterology;;   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   bilateral   right elbow surgery     RIGHT HEART CATH N/A 02/28/2021   Procedure: RIGHT HEART CATH;  Surgeon: Kathleene Hazel, MD;  Location: [facility] INVASIVE CV LAB;  Service: Cardiovascular;  Laterality: N/A;   THYROID LOBECTOMY Right 02/27/2017   THYROID LOBECTOMY Right 02/27/2017   Procedure: RIGHT THYROID LOBECTOMY;  Surgeon: Harriette Bouillon, MD;  Location: [facility] OR;  Service: General;  Laterality: Right;   TUBAL LIGATION      Current Medications: Current Meds  Medication Sig   ACCU-CHEK GUIDE test strip USE TO check blood glucose UP TO four times daily AS DIRECTED   Accu-Chek Softclix Lancets lancets USE TO check blood glucose UP TO four times daily AS DIRECTED   albuterol (PROVENTIL) (2.5 MG/3ML) 0.083% nebulizer solution USE 1 VIAL IN NEBULIZER EVERY 6 HOURS - and as needed   albuterol (VENTOLIN HFA) 108 (90 Base) MCG/ACT inhaler INHALE TWO PUFFS BY MOUTH INTO LUNGS every FOUR hours AS NEEDED FOR SHORTNESS OF BREATH   blood glucose meter kit and supplies KIT Dispense based on patient and insurance preference. Use up to four times daily as directed. (FOR ICD-9 250.00, 250.01).   cloNIDine (CATAPRES) 0.1 MG tablet TAKE ONE TABLET BY MOUTH BEFORE BREAKFAST and TAKE ONE TABLET BY MOUTH EVERYDAY AT BEDTIME   diclofenac sodium (VOLTAREN) 1 % GEL Apply 2 g topically daily as needed (fibromyalgia pain- affected sites).   diltiazem (CARDIZEM CD) 240 MG 24 hr capsule TAKE ONE CAPSULE BY MOUTH BEFORE BREAKFAST   DULoxetine (CYMBALTA) 30 MG capsule Take 90 mg by mouth at bedtime.   FEROSUL 325 (65 Fe) MG tablet TAKE ONE TABLET BY MOUTH ONCE DAILY   fexofenadine (ALLEGRA) 180 MG tablet TAKE ONE TABLET BY MOUTH ONCE DAILY (Patient taking differently: Take 180 mg by mouth as needed for allergies.)   fluticasone (FLONASE) 50 MCG/ACT nasal spray Place 2 sprays into both nostrils 2 (two) times daily. (Patient taking differently: Place 2 sprays into both nostrils 2 (two) times daily as needed for allergies or rhinitis.)   furosemide (LASIX) 80 MG tablet TAKE ONE TABLET BY MOUTH TWICE DAILY   gabapentin (NEURONTIN) 300 MG capsule TAKE  ONE CAPSULE BY MOUTH TWICE DAILY   Incontinence Supply Disposable (PROCARE BARIATRIC BRIEFS) MISC Use as needed.   INVOKANA 100 MG TABS tablet Take 100 mg by mouth every morning.   lidocaine (LIDODERM) 5 % Place 1 patch onto the skin daily. Remove & Discard patch within 12 hours or as directed by MD (Patient taking differently: Place 1 patch onto the skin daily as needed (to painful site- Remove & Discard patch within 12 hours or as directed by MD).)   LINZESS 145 MCG CAPS capsule TAKE ONE CAPSULE BY MOUTH every other DAY   Magnesium Oxide 400 MG CAPS Take 1 capsule (400 mg total) by mouth daily.   metFORMIN (GLUCOPHAGE) 500 MG tablet TAKE ONE TABLET BY MOUTH EVERYDAY AT BEDTIME   methocarbamol (ROBAXIN) 500 MG tablet Take 1  tablet (500 mg total) by mouth 2 (two) times daily. (Patient taking differently: Take 500 mg by mouth 2 (two) times daily as needed for muscle spasms.)   omeprazole (PRILOSEC) 20 MG capsule TAKE ONE CAPSULE BY MOUTH BEFORE BREAKFAST and TAKE ONE CAPSULE BY MOUTH EVERY EVENING   potassium chloride (MICRO-K) 10 MEQ CR capsule TAKE TWO CAPSULES BY MOUTH EVERYDAY AT BEDTIME   pravastatin (PRAVACHOL) 40 MG tablet TAKE ONE TABLET BY MOUTH EVERY EVENING   QUEtiapine (SEROQUEL) 50 MG tablet Take 50 mg by mouth in the morning and at bedtime.   Semaglutide, 1 MG/DOSE, 4 MG/3ML SOPN Inject 1 mg as directed once a week.   spironolactone (ALDACTONE) 25 MG tablet TAKE 1/2 TABLET BY MOUTH ONCE DAILY   SYMBICORT 160-4.5 MCG/ACT inhaler INHALE TWO PUFFS BY MOUTH INTO LUNGS twice daily (Patient taking differently: Inhale 2 puffs into the lungs 2 (two) times daily.)   traMADol (ULTRAM) 50 MG tablet Take 1 tablet (50 mg total) by mouth every 12 (twelve) hours as needed (for pain).   Vitamin D, Ergocalciferol, (DRISDOL) 1.25 MG (50000 UNIT) CAPS capsule Take 1 capsule (50,000 Units total) by mouth every 7 (seven) days.   warfarin (COUMADIN) 7.5 MG tablet TAKE ONE TABLET BY MOUTH DAILY   zonisamide  (ZONEGRAN) 100 MG capsule Take 100-200 mg by mouth See admin instructions. Take 100 mg by mouth in the morning and 200 mg in the evening   [DISCONTINUED] azithromycin (ZITHROMAX) 250 MG tablet Take 1 tab daily   Current Facility-Administered Medications for the 10/12/22 encounter (Office Visit) with Christell Constant, MD  Medication   sodium chloride flush (NS) 0.9 % injection 3 mL    Allergies:   Lisinopril, Prednisone, Spironolactone, and Corticosteroids   Social History   Socioeconomic History   Marital status: Divorced    Spouse name: Not on file   Number of children: Not on file   Years of education: Not on file   Highest education level: Not on file  Occupational History   Not on file  Tobacco Use   Smoking status: Never   Smokeless tobacco: Never  Vaping Use   Vaping status: Never Used  Substance and Sexual Activity   Alcohol use: No    Alcohol/week: 0.0 standard drinks of alcohol    Comment: beer in past    Drug use: Yes    Types: Cocaine    Comment: quit 2003-rehab progam in 2005   Sexual activity: Never  Other Topics Concern   Not on file  Social History Narrative   Lives alone in apartment, drives   Social Determinants of Health   Financial Resource Strain: Low Risk  (03/03/2022)   Overall Financial Resource Strain (CARDIA)    Difficulty of Paying Living Expenses: Not hard at all  Food Insecurity: No Food Insecurity (08/23/2022)   Hunger Vital Sign    Worried About Running Out of Food in the Last Year: Never true    Ran Out of Food in the Last Year: Never true  Transportation Needs: No Transportation Needs (09/07/2022)   PRAPARE - Administrator, Civil Service (Medical): No    Lack of Transportation (Non-Medical): No  Physical Activity: Insufficiently Active (03/03/2022)   Exercise Vital Sign    Days of Exercise per Week: 7 days    Minutes of Exercise per Session: 10 min  Stress: No Stress Concern Present (03/03/2022)   [employer]  of Occupational Health - Occupational Stress Questionnaire    Feeling  of Stress : Not at all  Social Connections: Moderately Integrated (03/03/2022)   Social Connection and Isolation Panel [NHANES]    Frequency of Communication with Friends and Family: More than three times a week    Frequency of Social Gatherings with Friends and Family: More than three times a week    Attends Religious Services: More than 4 times per year    Active Member of [employer] or Organizations: Yes    Attends Engineer, structural: More than 4 times per year    Marital Status: Divorced    Social:Cooks greens for folks, she completed a substance abuse program and is going to the reunion soon.  Family History: The patient's family history includes COPD in an other family member; Cancer in her mother; Diabetes in her mother; Heart failure in her father; Hyperlipidemia in an other family member; Hypertension in her father and mother. History of coronary artery disease notable for no members. History of heart failure notable for both parents. History of arrhythmia notable for no members. Denies family history of sudden cardiac death including drowning, car accidents, or unexplained deaths in the family. Mother->heart murmur  ROS:   Please see the history of present illness.     EKGs/Labs/Other Studies Reviewed:    The following studies were reviewed today:  Cardiac Studies & Procedures   CARDIAC CATHETERIZATION  CARDIAC CATHETERIZATION 02/28/2021  Narrative   Hemodynamic findings consistent with mild pulmonary hypertension.  RA 15 RV 47/6/13 PA 49/11 (mean 27)-Mild pulmonary HTN PCWP 17     ECHOCARDIOGRAM  ECHOCARDIOGRAM COMPLETE 08/24/2022  Narrative ECHOCARDIOGRAM REPORT    Patient Name:   [name] Date of Exam: 08/24/2022 Medical Rec #:  [id number]           Height:       66.0 in Accession #:    [id number]          Weight:       282.0 lb Date of Birth:  [birth year]           BSA:           2.314 m Patient Age:    67 years            BP:           132/90 mmHg Patient Gender: F                   HR:           100 bpm. Exam Location:  Inpatient  Procedure: 2D Echo, Cardiac Doppler and Color Doppler  Indications:    Syncope  History:        Patient has prior history of Echocardiogram examinations, most recent 05/04/2020. CHF, COPD, Signs/Symptoms:Syncope and Dizziness/Lightheadedness; Risk Factors:Hypertension, Dyslipidemia, Diabetes, Hx DVT, substance abuse and Sleep Apnea.  Sonographer:    Wallie Char Referring Phys: [id number] RIPUDEEP K RAI   Sonographer Comments: Patient is obese. Image acquisition challenging due to patient body habitus. IMPRESSIONS   1. Left ventricular ejection fraction, by estimation, is 65 to 70%. The left ventricle has normal function. The left ventricle has no regional wall motion abnormalities. Left ventricular diastolic parameters are consistent with Grade I diastolic dysfunction (impaired relaxation). Elevated left ventricular end-diastolic pressure. 2. Right ventricular systolic function is normal. The right ventricular size is normal. 3. The mitral valve is normal in structure. No evidence of mitral valve regurgitation. No evidence of mitral stenosis. 4. The aortic valve was not well visualized. There is  mild calcification of the aortic valve. Aortic valve regurgitation is not visualized. Aortic valve sclerosis is present, with no evidence of aortic valve stenosis. 5. The inferior vena cava is normal in size with greater than 50% respiratory variability, suggesting right atrial pressure of 3 mmHg.  FINDINGS Left Ventricle: Left ventricular ejection fraction, by estimation, is 65 to 70%. The left ventricle has normal function. The left ventricle has no regional wall motion abnormalities. The left ventricular internal cavity size was normal in size. There is no left ventricular hypertrophy. Left ventricular diastolic parameters are  consistent with Grade I diastolic dysfunction (impaired relaxation). Elevated left ventricular end-diastolic pressure.  Right Ventricle: The right ventricular size is normal. No increase in right ventricular wall thickness. Right ventricular systolic function is normal.  Left Atrium: Left atrial size was normal in size.  Right Atrium: Right atrial size was normal in size.  Pericardium: There is no evidence of pericardial effusion.  Mitral Valve: The mitral valve is normal in structure. No evidence of mitral valve regurgitation. No evidence of mitral valve stenosis. MV peak gradient, 8.0 mmHg. The mean mitral valve gradient is 2.0 mmHg.  Tricuspid Valve: The tricuspid valve is normal in structure. Tricuspid valve regurgitation is not demonstrated. No evidence of tricuspid stenosis.  Aortic Valve: The aortic valve was not well visualized. There is mild calcification of the aortic valve. Aortic valve regurgitation is not visualized. Aortic valve sclerosis is present, with no evidence of aortic valve stenosis. Aortic valve mean gradient measures 5.0 mmHg. Aortic valve peak gradient measures 9.0 mmHg. Aortic valve area, by VTI measures 2.18 cm.  Pulmonic Valve: The pulmonic valve was normal in structure. Pulmonic valve regurgitation is not visualized. No evidence of pulmonic stenosis.  Aorta: The aortic root is normal in size and structure.  Venous: The inferior vena cava is normal in size with greater than 50% respiratory variability, suggesting right atrial pressure of 3 mmHg.  IAS/Shunts: The interatrial septum was not well visualized.   LEFT VENTRICLE PLAX 2D LVIDd:         3.60 cm     Diastology LVIDs:         2.40 cm     LV e' medial:    4.56 cm/s LV PW:         1.00 cm     LV E/e' medial:  15.1 LV IVS:        1.00 cm     LV e' lateral:   4.72 cm/s LVOT diam:     1.90 cm     LV E/e' lateral: 14.6 LV SV:         48 LV SV Index:   21 LVOT Area:     2.84 cm  LV Volumes (MOD) LV  vol d, MOD A2C: 71.5 ml LV vol d, MOD A4C: 80.4 ml LV vol s, MOD A2C: 24.2 ml LV vol s, MOD A4C: 26.6 ml LV SV MOD A2C:     47.3 ml LV SV MOD A4C:     80.4 ml LV SV MOD BP:      50.2 ml  RIGHT VENTRICLE RV S prime:     29.00 cm/s TAPSE (M-mode): 1.7 cm  LEFT ATRIUM             Index        RIGHT ATRIUM           Index LA diam:        3.10 cm 1.34 cm/m   RA  Area:     10.40 cm LA Vol (A2C):   40.8 ml 17.63 ml/m  RA Volume:   21.80 ml  9.42 ml/m LA Vol (A4C):   36.5 ml 15.77 ml/m LA Biplane Vol: 41.1 ml 17.76 ml/m AORTIC VALVE AV Area (Vmax):    2.35 cm AV Area (Vmean):   2.30 cm AV Area (VTI):     2.18 cm AV Vmax:           150.00 cm/s AV Vmean:          97.600 cm/s AV VTI:            0.220 m AV Peak Grad:      9.0 mmHg AV Mean Grad:      5.0 mmHg LVOT Vmax:         124.50 cm/s LVOT Vmean:        79.250 cm/s LVOT VTI:          0.169 m LVOT/AV VTI ratio: 0.77  AORTA Ao Root diam: 3.20 cm Ao Asc diam:  3.30 cm  MITRAL VALVE               TRICUSPID VALVE MV Area (PHT): 3.77 cm    TR Peak grad:   25.8 mmHg MV Area VTI:   2.46 cm    TR Vmax:        254.00 cm/s MV Peak grad:  8.0 mmHg MV Mean grad:  2.0 mmHg    SHUNTS MV Vmax:       1.41 m/s    Systemic VTI:  0.17 m MV Vmean:      72.0 cm/s   Systemic Diam: 1.90 cm MV Decel Time: 201 msec MV E velocity: 68.80 cm/s MV A velocity: 98.90 cm/s MV E/A ratio:  0.70  Charlton Haws MD Electronically signed by Charlton Haws MD Signature Date/Time: 08/24/2022/12:24:16 PM    Final    MONITORS  LONG TERM MONITOR (3-14 DAYS) 03/25/2020  Narrative  Patient had a minimum heart rate of 67 bpm, maximum heart rate of 164 bpm, and average heart rate of 88 bpm.  Predominant underlying rhythm was sinus rhythm.  One run of ventricular tachycardia occurred lasting 5 beats at longest with a max rate of 164 bpm at fastest.  Thirteen runs of supraventricular tachycardia occurred lasting 14 beats at longest with a max rate of 162  bpm at fastest.  Isolated PACs were rare (<1.0%), with rare couplets and triplets present.  Isolated PVCs were rare (<1.0%), with rare couplets and triplets present; bigeminy and trigeminy present.  No evidence of complete heart block .  Triggered and diary events associated overwhelming with sinus rhythm or sinus tachycardia; once with PVC.  No malignant arrhythmias.   CT SCANS  CT CORONARY MORPH W/CTA COR W/SCORE 05/07/2020  Addendum 05/07/2020 10:38 AM ADDENDUM REPORT: 05/07/2020 10:36  CLINICAL DATA:  67 Year-old African American Female  EXAM: Cardiac/Coronary  CTA  TECHNIQUE: The patient was scanned on a [employer].  FINDINGS: A 100 kV prospective scan was triggered in the descending thoracic aorta at 111 HU's. Axial non-contrast 3 mm slices were carried out through the heart. The data set was analyzed on a dedicated work station and scored using the Agatson method. Gantry rotation speed was 250 msecs and collimation was .6 mm. No beta blockade and 0.8 mg of sl NTG was given. The 3D data set was reconstructed in 5% intervals of the 67-82 % of the R-R cycle. Diastolic phases were analyzed  on a dedicated work station using MPR, MIP and VRT modes. The patient received 80 cc of contrast.  Aorta:  Normal size.  Aortic atherosclerosis noted.  No dissection.  Aortic Valve:  Tri-leaflet.  No calcifications.  Coronary Arteries:  Normal coronary origin.  Right dominance.  Coronary calcium score of 0. This was 1st percentile for age, sex, and race matched control.  RCA is a large dominant artery that gives rise to PDA and PLA. There is no plaque.  Left main is a large artery that gives rise to LAD and LCX arteries. There is no plaque.  LAD is a large vessel that gives rise to two small diagonal vessels. There is no plaque.  LCX is a non-dominant artery that bifurcates to terminal vessel and OM1 branch. There is no plaque.  Other findings:  Normal  pulmonary vein drainage into the left atrium.  Normal left atrial appendage without a thrombus.  Normal size of the pulmonary artery.  Extra-cardiac findings: See attached radiology report for non-cardiac structures.  IMPRESSION: 1. Coronary calcium score of 0. This was 1st percentile for age, sex, and race matched control.  2. Normal coronary origin with right dominance.  3. CAD-RADS 0. No evidence of CAD (0%). Consider non-atherosclerotic causes of discomfort.  4.  Aortic atherosclerosis noted.   Electronically Signed By: Riley Lam MD On: 05/07/2020 10:36  Narrative EXAM: OVER-READ INTERPRETATION  CT CHEST  The following report is an over-read performed by radiologist Dr. Jeronimo Greaves of [facility] on 05/07/2020. This over-read does not include interpretation of cardiac or coronary anatomy or pathology. The coronary CTA interpretation by the cardiologist is attached.  COMPARISON:  04/07/2020 chest radiograph.  CTA chest 11/23/2008  FINDINGS: Vascular: Aortic atherosclerosis. No central pulmonary embolism, on this non-dedicated study.  Mediastinum/Nodes: No imaged thoracic adenopathy. Small hiatal hernia.  Lungs/Pleura: No pleural fluid.  Clear imaged lungs.  Upper Abdomen: Moderate hepatic steatosis.  Musculoskeletal: Lower thoracic spondylosis.  IMPRESSION: 1. No acute findings in the imaged extracardiac chest. 2. Hepatic steatosis. 3. Small hiatal hernia. 4. Aortic Atherosclerosis (ICD10-I70.0).  Electronically Signed: By: Jeronimo Greaves M.D. On: 05/07/2020 10:06           Recent Labs: 06/22/2022: TSH 4.02 09/06/2022: Magnesium 2.0 09/07/2022: Hemoglobin 11.0; Platelet Count 474 09/11/2022: ALT 21; BUN 11; Creatinine, Ser 0.76; Potassium 3.8; Sodium 136  Recent Lipid Panel    Component Value Date/Time   CHOL 207 (H) 06/22/2022 1054   TRIG 102.0 06/22/2022 1054   HDL 71.30 06/22/2022 1054   CHOLHDL 3 06/22/2022 1054    VLDL 20.4 06/22/2022 1054   LDLCALC 115 (H) 06/22/2022 1054   LDLCALC 89 02/02/2020 1126    Physical Exam:    VS:  BP 120/80   Pulse 88   Ht 5\' 6"  (1.676 m)   Wt 284 lb (128.8 kg)   SpO2 94%   BMI 45.84 kg/m     Wt Readings from Last 3 Encounters:  10/12/22 284 lb (128.8 kg)  09/06/22 284 lb 6.4 oz (129 kg)  08/23/22 281 lb 15.5 oz (127.9 kg)    Gen: No distress, Morbid Obesity, female   Neck: No JVD Cardiac: No Rubs or Gallops, no murmur, regular rhythm  +2 radial pulses Respiratory: Clear to auscultation bilaterally, normal effort, normal  respiratory rate GI: Soft, nontender, non-distended  MS: Non pitting edema, improved;  moves all extremities Integument: Skin feels warm Neuro:  At time of evaluation, alert and oriented to person/place/time/situation  Psych: Normal affect, appropriate mood  ASSESSMENT:    1. Mixed hyperlipidemia   2. Primary hypertension   3. Chronic diastolic CHF (congestive heart failure) (HCC)   4. Paroxysmal SVT (supraventricular tachycardia)     PLAN:    HTN with DM Chronic Heart Failure Preserved Ejection Fraction  In the setting of Morbid Obesity, Asthma, OSA overlap - NYHA class I, Stage C, euvolemic etiology in the setting of DM/HTN  - Diuretic regimen: lasix 80 mg PO BID - BP controlled on clonidine and MRA, if worsening will increase to 25 mg aldactone and K stop - presently switch from invokana to semaglutide; if worsening would return SGLT2i  DVT on chronic AC  - coumadin, no cardiac indications for [facility], as per primary, has recovered from GI bleed  HLD - no significant aortic atherosclerosis on my past eval -  LDL Bollig < 100; labs today  Asx SVT on heart monitor - diltiazem 240 mg PO XL  One year with team    Medication Adjustments/Labs and Tests Ordered: Current medicines are reviewed at length with the patient today.  Concerns regarding medicines are outlined above.  Orders Placed This Encounter  Procedures   Lipid  panel   ALT     No orders of the defined types were placed in this encounter.     Patient Instructions  Medication Instructions:  Your physician recommends that you continue on your current medications as directed. Please refer to the Current Medication list given to you today.  *If you need a refill on your cardiac medications before your next appointment, please call your pharmacy*   Lab Work: FLP, ALT  If you have labs (blood work) drawn today and your tests are completely normal, you will receive your results only by: MyChart Message (if you have MyChart) OR A paper copy in the mail If you have any lab test that is abnormal or we need to change your treatment, we will call you to review the results.   Testing/Procedures: NONE   Follow-Up: At [name], you and your health needs are our priority.  As part of our continuing mission to provide you with exceptional heart care, we have created designated Provider Care Teams.  These Care Teams include your primary Cardiologist (physician) and Advanced Practice Providers (APPs -  Physician Assistants and Nurse Practitioners) who all work together to provide you with the care you need, when you need it.    Your next appointment:   1 year(s)  Provider:   Christell Constant, MD        Signed, Christell Constant, MD  10/12/2022 10:58 AM    [facility]

## 2022-10-12 NOTE — Patient Instructions (Signed)
Medication Instructions:  Your physician recommends that you continue on your current medications as directed. Please refer to the Current Medication list given to you today.  *If you need a refill on your cardiac medications before your next appointment, please call your pharmacy*   Lab Work: FLP, ALT  If you have labs (blood work) drawn today and your tests are completely normal, you will receive your results only by: MyChart Message (if you have MyChart) OR A paper copy in the mail If you have any lab test that is abnormal or we need to change your treatment, we will call you to review the results.   Testing/Procedures: NONE   Follow-Up: At [name], you and your health needs are our priority.  As part of our continuing mission to provide you with exceptional heart care, we have created designated Provider Care Teams.  These Care Teams include your primary Cardiologist (physician) and Advanced Practice Providers (APPs -  Physician Assistants and Nurse Practitioners) who all work together to provide you with the care you need, when you need it.    Your next appointment:   1 year(s)  Provider:   Christell Constant, MD

## 2022-10-17 ENCOUNTER — Telehealth: Payer: Self-pay | Admitting: Internal Medicine

## 2022-10-17 DIAGNOSIS — E782 Mixed hyperlipidemia: Secondary | ICD-10-CM

## 2022-10-17 MED ORDER — ROSUVASTATIN CALCIUM 5 MG PO TABS: 5.0000 mg | ORAL_TABLET | Freq: Every day | ORAL | 3 refills | Status: DC

## 2022-10-17 NOTE — Telephone Encounter (Signed)
-----   Message from Christell Constant sent at 10/17/2022 10:24 AM EDT ----- Results: LDL above [place]; limited dietary options Plan: Rosuvastatin 5 mg and labs in three months  Christell Constant, MD

## 2022-10-17 NOTE — Telephone Encounter (Signed)
The patient has been notified of the result and verbalized understanding.  All questions (if any) were answered. Ilija Maxim N Norma Montemurro, RN 10/17/2022 3:27 PM   Pt already taking pravastatin 40 mg as ordered by PCP. Spoke with MD advised pt stop pravastatin and start rosuvastatin 5 mg PO every day will have f/u labs on 01/22/23.  All questions answered.  Appointment reminder mailed out per pt request.

## 2022-10-17 NOTE — Telephone Encounter (Signed)
Patient is concerned her cholesterol reading are too high and she wants to know next steps.

## 2022-10-18 ENCOUNTER — Other Ambulatory Visit: Payer: Self-pay | Admitting: Internal Medicine

## 2022-10-18 ENCOUNTER — Other Ambulatory Visit: Payer: Self-pay | Admitting: Family Medicine

## 2022-10-18 DIAGNOSIS — M797 Fibromyalgia: Secondary | ICD-10-CM

## 2022-10-18 DIAGNOSIS — M546 Pain in thoracic spine: Secondary | ICD-10-CM

## 2022-10-18 DIAGNOSIS — K5900 Constipation, unspecified: Secondary | ICD-10-CM

## 2022-10-19 DIAGNOSIS — F331 Major depressive disorder, recurrent, moderate: Secondary | ICD-10-CM | POA: Insufficient documentation

## 2022-10-19 DIAGNOSIS — F411 Generalized anxiety disorder: Secondary | ICD-10-CM | POA: Insufficient documentation

## 2022-10-23 ENCOUNTER — Ambulatory Visit (INDEPENDENT_AMBULATORY_CARE_PROVIDER_SITE_OTHER): Payer: 59 | Admitting: Orthopedic Surgery

## 2022-10-23 DIAGNOSIS — M65332 Trigger finger, left middle finger: Secondary | ICD-10-CM | POA: Diagnosis not present

## 2022-10-23 DIAGNOSIS — M65312 Trigger thumb, left thumb: Secondary | ICD-10-CM | POA: Diagnosis not present

## 2022-10-23 MED ORDER — BETAMETHASONE SOD PHOS & ACET 6 (3-3) MG/ML IJ SUSP
6.0000 mg | INTRAMUSCULAR | Status: AC | PRN
Start: 2022-10-23 — End: 2022-10-23
  Administered 2022-10-23: 6 mg via INTRA_ARTICULAR

## 2022-10-23 MED ORDER — LIDOCAINE HCL 1 % IJ SOLN
1.0000 mL | INTRAMUSCULAR | Status: AC | PRN
Start: 2022-10-23 — End: 2022-10-23
  Administered 2022-10-23: 1 mL

## 2022-10-23 NOTE — Progress Notes (Signed)
[name] - 67 y.o. female MRN [id number]  Date of birth: Jul 17, 1955  Office Visit Note: Visit Date: 10/23/2022 PCP: [name], MD Referred by: [name], MD  Subjective: Chief Complaint  Patient presents with   Left Hand - Pain   HPI: [name] is a pleasant 67 y.o. female who presents today for evaluation of left thumb trigger digit over the past 1 year so.  She is also describing pain at the left long finger A1 pulley region there is more recent in nature, no significant triggering to the long finger at this juncture.  She is diabetic, well-controlled, recent A1c 7.2.  Visit Reason: Left hand "cyst"/ thumb trigger Hand dominance: left Occupation: Disabled Diabetic: Yes/ 7.2  Prior Testing: xray 07/17/22 Injections: none  Treatments: none Prior Surgery: none   *painful to touch; thumb does trigger*  Pertinent ROS were reviewed with the patient and found to be negative unless otherwise specified above in HPI.   Assessment & Plan: Visit Diagnoses: No diagnosis found.  Plan: Extensive discussion was had with patient today regarding her left thumb and long finger trigger digits.  Pathophysiology and etiology of this disease was described in detail.  We discussed treatment options ranging from conservative to surgical.  At this juncture, given that she has not undergone prior conservative treatments, she is indicated for injection to the left thumb and long finger A1 pulley for symptom relief.  We did discuss the efficacy of the injection as well as the possibility for recurrence particularly in diabetic patients.  Surgical options were discussed as well as all risk and benefits.  Patient would like to proceed with injections and will follow-up in approximate 6 weeks for repeat evaluation.  She was also instructed to monitor her blood sugars carefully over the next week status post injections today.    Follow-up: No follow-ups on file.   Meds &  Orders: No orders of the defined types were placed in this encounter.  No orders of the defined types were placed in this encounter.    Procedures: Hand/UE Inj: L thumb A1 for trigger finger on 10/23/2022 9:13 AM Indications: pain Details: 22 G needle, volar approach Medications: 1 mL lidocaine 1 %; 6 mg betamethasone acetate-betamethasone sodium phosphate 6 (3-3) MG/ML Consent was given by the patient. Patient was prepped and draped in the usual sterile fashion.    Hand/UE Inj: L long A1 for trigger finger on 10/23/2022 9:13 AM Indications: pain Details: 22 G needle, volar approach Medications: 1 mL lidocaine 1 %; 6 mg betamethasone acetate-betamethasone sodium phosphate 6 (3-3) MG/ML Consent was given by the patient. Patient was prepped and draped in the usual sterile fashion.          Clinical History: No specialty comments available.  She reports that she has never smoked. She has never used smokeless tobacco.  Recent Labs    01/02/22 1450 04/20/22 0833 06/22/22 1054 08/23/22 1859  HGBA1C 9.5* 7.0* 7.2*  --   LABURIC  --   --   --  6.2    Objective:   Vital Signs: There were no vitals taken for this visit.  Physical Exam  Gen: Well-appearing, in no acute distress; non-toxic CV: Regular Rate. Well-perfused. Warm.  Resp: Breathing unlabored on room air; no wheezing. Psych: Fluid speech in conversation; appropriate affect; normal thought process Neuro: Sensation intact throughout. No gross coordination deficits.   Ortho Exam PHYSICAL EXAM:  General: Patient is well appearing and in  no distress. Cervical spine mobility is full in all directions:  Skin and Muscle: No skin changes are apparent to upper extremities.  Muscle bulk and contour normal, no signs of atrophy.     Range of Motion and Palpation Tests:  Left hand: Palpable nodules at both and long finger A1 pulleys, tender to palpation.  There is triggering of the thumb, locking required manual correction.   Slight triggering of the long finger.    Neurologic, Vascular, Motor: Sensation is intact to light touch in the median/radial/ulnar distributions. Fingers pink and well perfused.  Capillary refill is brisk.      Lab Results  Component Value Date   HGBA1C 7.2 (H) 06/22/2022     Imaging: No results found.  Past Medical/Family/Surgical/Social History: Medications & Allergies reviewed per EMR, new medications updated. Patient Active Problem List   Diagnosis Date Noted   Normocytic anemia 08/23/2022   Left knee pain 08/23/2022   Mixed hyperlipidemia 10/07/2021   Paroxysmal SVT (supraventricular tachycardia) 10/07/2021   Allergic rhinitis 11/29/2020   Allergic rhinitis due to pollen 11/29/2020   Moderate persistent asthma without complication 11/29/2020   Osteoarthritis of spine with radiculopathy, lumbar region 08/24/2020   Chronic back pain 08/18/2020   Obesity, diabetes, and hypertension syndrome (HCC) 02/27/2020   Vitreomacular adhesion of both eyes 02/09/2020   Pseudophakia of both eyes 02/09/2020   Chronic diastolic CHF (congestive heart failure) (HCC) 03/12/2019   Hypernatremia 03/09/2019   Suspected COVID-19 virus infection 03/09/2019   Thyroid mass 02/27/2017   Chronic pain of right knee 05/24/2016   Effusion, right knee 04/28/2016   Presence of right artificial knee joint 04/28/2016   Abnormal LFTs    Decreased sensation    Paresthesia 06/12/2014   Obstruction of kidney 04/24/2014   Diabetes type 2, uncontrolled 04/24/2014   Chronic anticoagulation 04/24/2014   Crack cocaine use 04/24/2014   Depression 04/24/2014   Fibromyalgia 04/24/2014   OSA (obstructive sleep apnea) 04/24/2014   Lupus anticoagulant positive 04/24/2014   Adrenal mass, right (HCC) 04/24/2014   Right kidney mass 04/24/2014   Hydronephrosis of right kidney 04/24/2014   Supratherapeutic INR 04/24/2014   Renal mass, right 04/24/2014   Diabetes mellitus with coincident hypertension (HCC)     Right lower quadrant abdominal pain    Morbid obesity (HCC) 08/21/2013   Constipation 08/19/2013   Lumbosacral spondylosis without myelopathy 11/19/2012   Bilateral deep vein thromboses (HCC) 12/13/2011   Hypokalemia 11/05/2011   Sciatica 11/05/2011   HTN (hypertension) 11/05/2011   Past Medical History:  Diagnosis Date   Adrenal mass (HCC) 03/226   Benign   Arthritis    knees/multiple orthopedic conditons; lower back   Asthma    per pt   Clotting disorder (HCC)    +beta-2-glycoprotein IgA antibody   COPD (chronic obstructive pulmonary disease) (HCC)    inhalers dependent on environment   Depression    Diabetes mellitus    120s usually fasting -  dx more than 10 yrs ago   Dizziness, nonspecific    DVT (deep venous thrombosis) (HCC)    Recurrent   Fibromyalgia    GERD (gastroesophageal reflux disease)    Heart murmur    History of blood transfusion    History of cocaine abuse (HCC)    Remote history    Hyperlipidemia    Hypertension    takes meds daily   Hypothyroidism    Lupus Anticoagulant Positive    On home oxygen therapy    at night  Shortness of breath    exertion or lying flat   Sleep apnea    2l of oxygen at night (as of 12/6, she used to)   Family History  Problem Relation Age of Onset   Hypertension Mother    Diabetes Mother    Cancer Mother        Pancreatic   Hypertension Father    Heart failure Father    Hyperlipidemia Other    COPD Other    Past Surgical History:  Procedure Laterality Date   ABDOMINAL HYSTERECTOMY  1989   Fibroids   ANTERIOR LUMBAR FUSION  01/03/2012   Procedure: ANTERIOR LUMBAR FUSION 1 LEVEL;  Surgeon: Carmela Hurt, MD;  Location: [facility];  Service: Neurosurgery;  Laterality: N/A;  Lumbar Four-Five Anterior Lumbar Interbody Fusion with Instrumentation   BACK SURGERY     cervical spine---disk disease   CARPAL TUNNEL RELEASE     Bilateral   CESAREAN SECTION     CHOLECYSTECTOMY      CYSTOSCOPY/RETROGRADE/URETEROSCOPY/STONE EXTRACTION WITH BASKET Right 04/26/2014   Procedure: CYSTOSCOPY/ RIGHT RETROGRADE/ RIGHT URETEROSCOPY/URETERAL AND RENAL PELVIS BIOPSY;  Surgeon: Sebastian Ache, MD;  Location: [facility];  Service: Urology;  Laterality: Right;   ESOPHAGOGASTRODUODENOSCOPY (EGD) WITH PROPOFOL N/A 08/25/2022   Procedure: ESOPHAGOGASTRODUODENOSCOPY (EGD) WITH PROPOFOL;  Surgeon: Jeani Hawking, MD;  Location: [facility];  Service: Gastroenterology;  Laterality: N/A;   gallstones removed     HEMOSTASIS CLIP PLACEMENT  08/25/2022   Procedure: HEMOSTASIS CLIP PLACEMENT;  Surgeon: Jeani Hawking, MD;  Location: [facility];  Service: Gastroenterology;;   HOT HEMOSTASIS N/A 08/25/2022   Procedure: HOT HEMOSTASIS (ARGON PLASMA COAGULATION/BICAP);  Surgeon: Jeani Hawking, MD;  Location: [facility];  Service: Gastroenterology;  Laterality: N/A;   POLYPECTOMY  08/25/2022   Procedure: POLYPECTOMY;  Surgeon: Jeani Hawking, MD;  Location: [facility];  Service: Gastroenterology;;   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   bilateral   right elbow surgery     RIGHT HEART CATH N/A 02/28/2021   Procedure: RIGHT HEART CATH;  Surgeon: Kathleene Hazel, MD;  Location: [facility];  Service: Cardiovascular;  Laterality: N/A;   THYROID LOBECTOMY Right 02/27/2017   THYROID LOBECTOMY Right 02/27/2017   Procedure: RIGHT THYROID LOBECTOMY;  Surgeon: Harriette Bouillon, MD;  Location: [facility] OR;  Service: General;  Laterality: Right;   TUBAL LIGATION     Social History   Occupational History   Not on file  Tobacco Use   Smoking status: Never   Smokeless tobacco: Never  Vaping Use   Vaping status: Never Used  Substance and Sexual Activity   Alcohol use: No    Alcohol/week: 0.0 standard drinks of alcohol    Comment: beer in past    Drug use: Yes    Types: Cocaine    Comment: quit 2003-rehab progam in 2005   Sexual activity: Never     Fara Boros) Denese Killings, M.D. [facility] 9:09  AM

## 2022-10-24 ENCOUNTER — Ambulatory Visit (INDEPENDENT_AMBULATORY_CARE_PROVIDER_SITE_OTHER): Payer: 59

## 2022-10-24 DIAGNOSIS — M2042 Other hammer toe(s) (acquired), left foot: Secondary | ICD-10-CM | POA: Diagnosis not present

## 2022-10-24 DIAGNOSIS — M2142 Flat foot [pes planus] (acquired), left foot: Secondary | ICD-10-CM

## 2022-10-24 DIAGNOSIS — M2141 Flat foot [pes planus] (acquired), right foot: Secondary | ICD-10-CM | POA: Diagnosis not present

## 2022-10-24 DIAGNOSIS — E1142 Type 2 diabetes mellitus with diabetic polyneuropathy: Secondary | ICD-10-CM | POA: Diagnosis not present

## 2022-10-24 NOTE — Progress Notes (Signed)

## 2022-10-27 ENCOUNTER — Other Ambulatory Visit: Payer: Self-pay | Admitting: Family Medicine

## 2022-10-27 ENCOUNTER — Other Ambulatory Visit: Payer: Self-pay | Admitting: Internal Medicine

## 2022-10-27 DIAGNOSIS — E559 Vitamin D deficiency, unspecified: Secondary | ICD-10-CM

## 2022-10-29 IMAGING — CT CT HEART MORP W/ CTA COR W/ SCORE W/ CA W/CM &/OR W/O CM
1 series · 6 of 14 positions shown, 8 images · non-contrast
Comparison: 04/07/2020 chest radiograph.  CTA chest 11/23/2008
COMPARISON: 04/07/2020 chest radiograph.  CTA chest 11/23/2008

Addendum:
EXAM:
OVER-READ INTERPRETATION  CT CHEST

The following report is an over-read performed by radiologist Dr.
Khizar Dhillo [REDACTED] on 05/07/2020. This over-read
does not include interpretation of cardiac or coronary anatomy or
pathology. The coronary CTA interpretation by the cardiologist is
attached.
CLINICAL DATA: 64 Year-old African American Female
Cardiac/Coronary  CTA
TECHNIQUE: The patient was scanned on a Phillips Force scanner.

[Series 735: — · 0.46mm/px · 6 of 14 slices shown, 8 images]
[im 3/14  vessel]
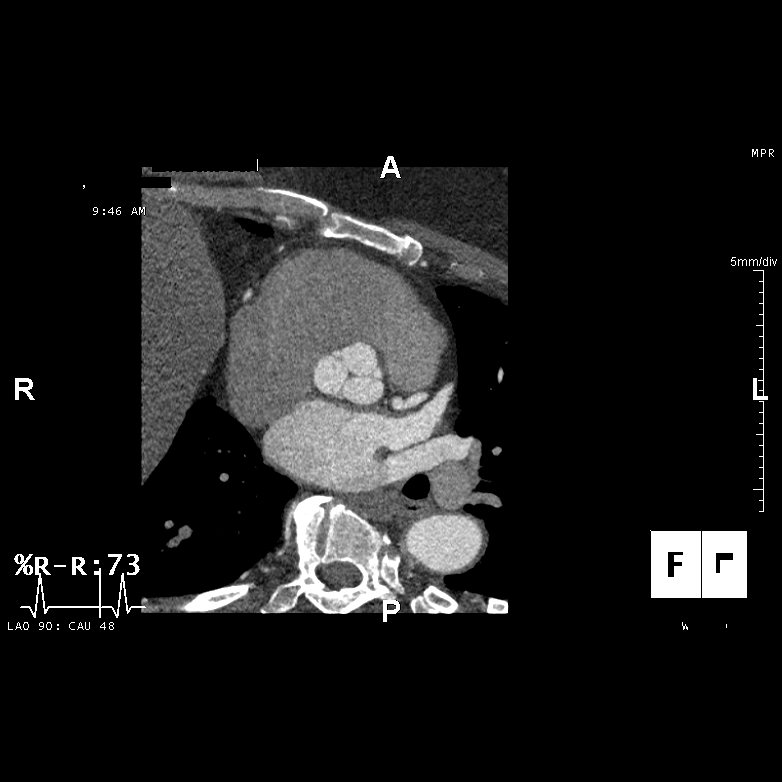
[im 3/14  lung]
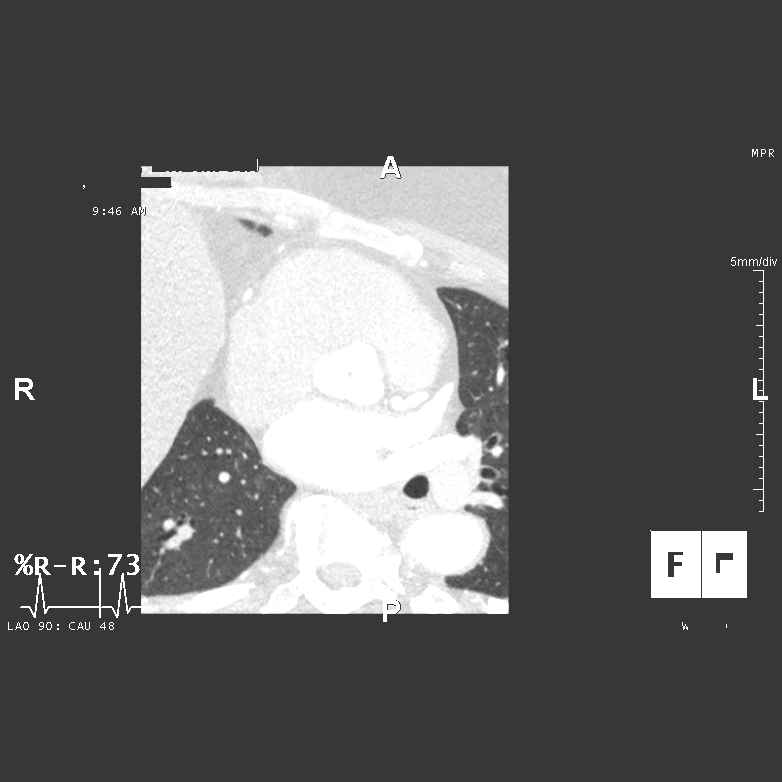
[im 5/14  vessel]
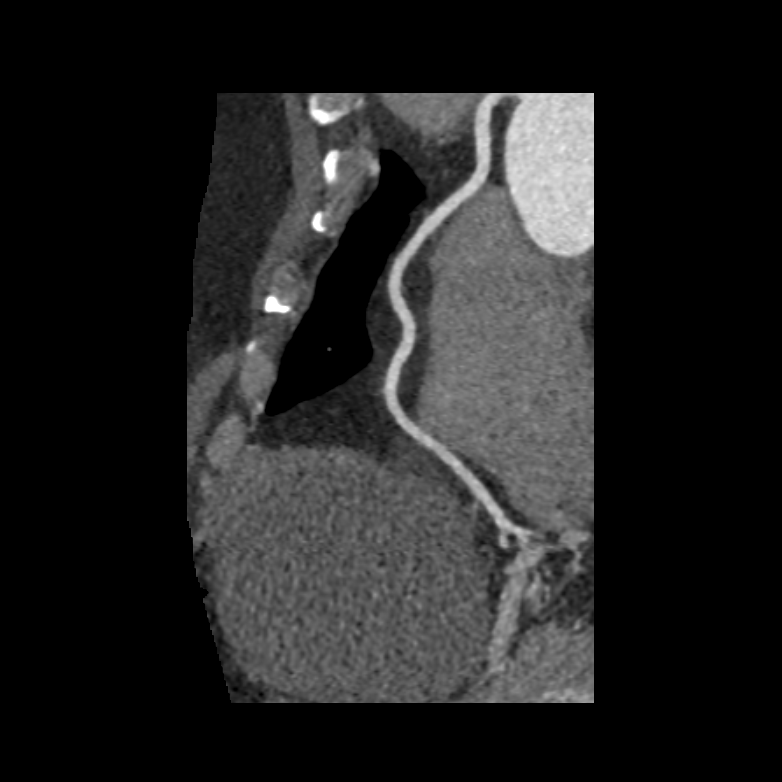
[im 7/14  vessel]
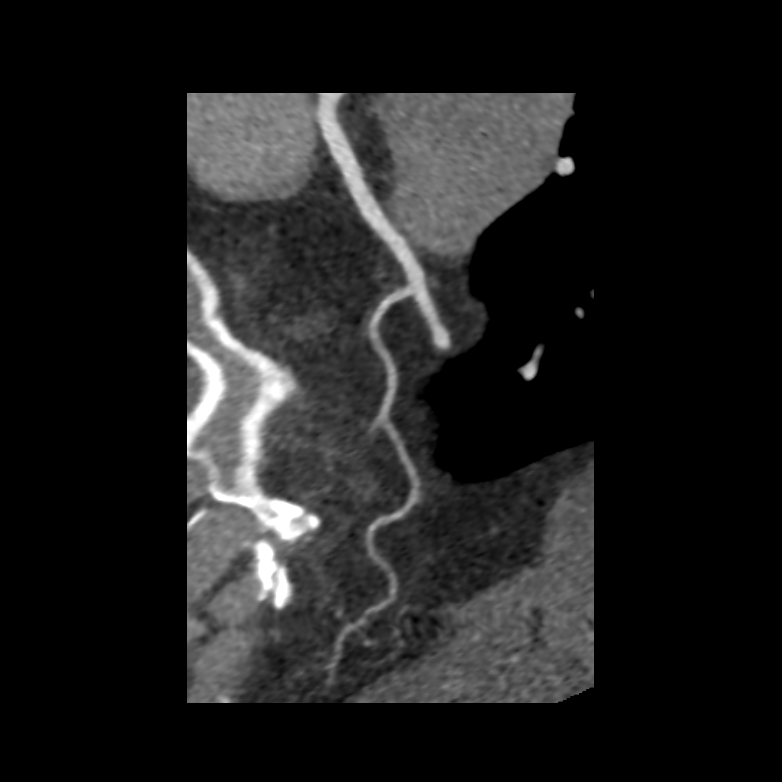
[im 9/14  vessel]
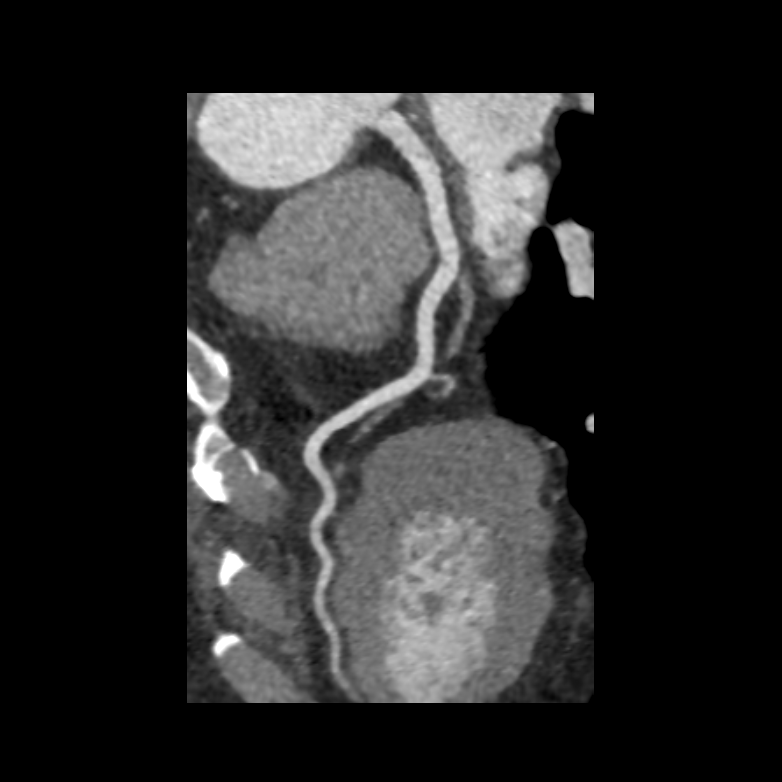
[im 11/14  vessel]
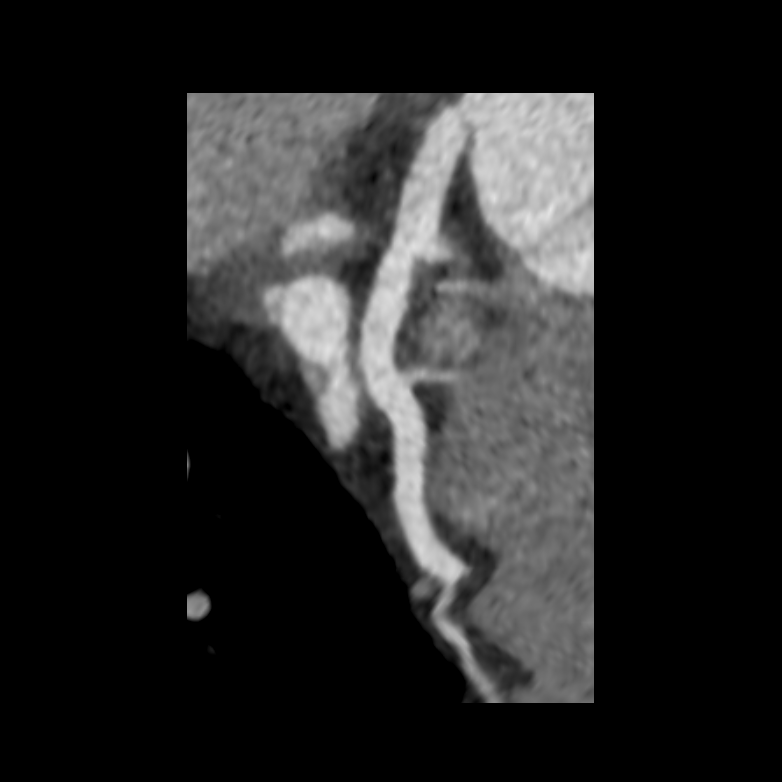
[im 11/14  lung]
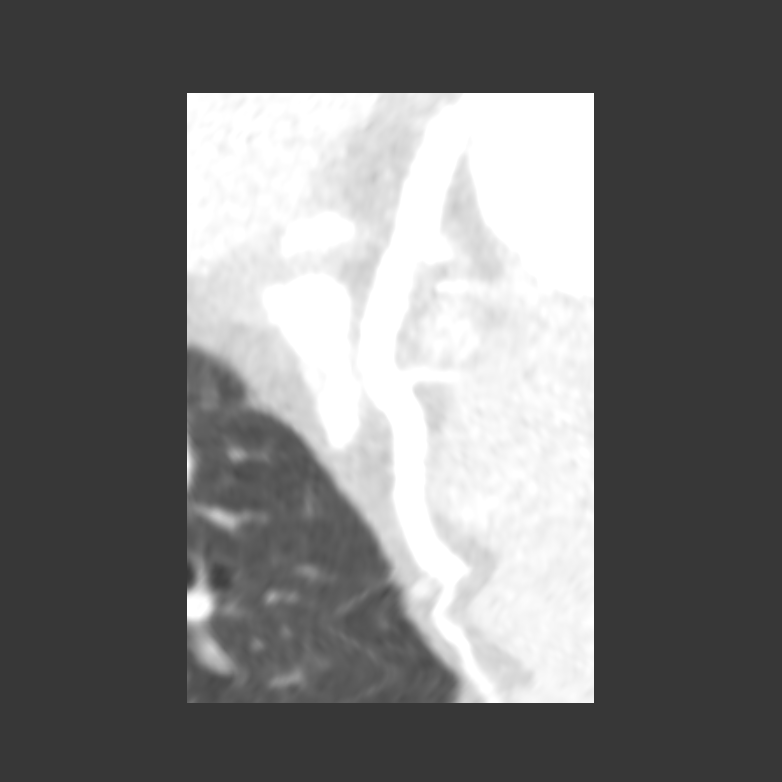
[im 13/14  vessel]
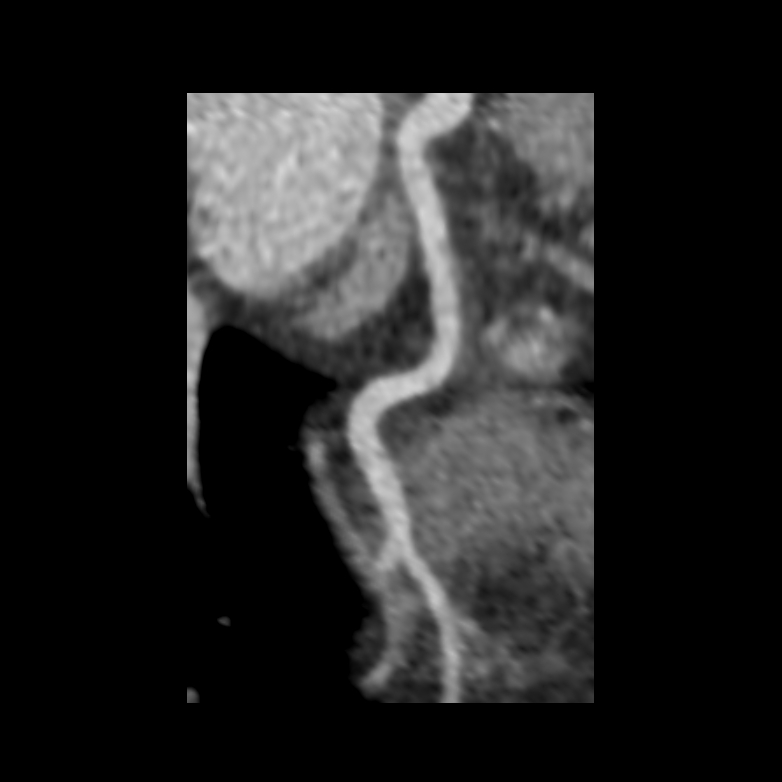

[6 of 14 positions shown; findings below may reference images not displayed]

FINDINGS: Vascular: Aortic atherosclerosis. No central pulmonary embolism, on
this non-dedicated study.

Mediastinum/Nodes: No imaged thoracic adenopathy. Small hiatal
hernia.

Lungs/Pleura: No pleural fluid.  Clear imaged lungs.

Upper Abdomen: Moderate hepatic steatosis.

Musculoskeletal: Lower thoracic spondylosis.
IMPRESSION: 1. No acute findings in the imaged extracardiac chest.
2. Hepatic steatosis.
3. Small hiatal hernia.
4. Aortic Atherosclerosis (S1PIV-BAZ.Z).
FINDINGS: A 100 kV prospective scan was triggered in the descending thoracic
aorta at 111 HU's. Axial non-contrast 3 mm slices were carried out
through the heart. The data set was analyzed on a dedicated work
station and scored using the Agatson method. Gantry rotation speed
was 250 msecs and collimation was .6 mm. No beta blockade and 0.8 mg
of sl NTG was given. The 3D data set was reconstructed in 5%
intervals of the 67-82 % of the R-R cycle. Diastolic phases were
analyzed on a dedicated work station using MPR, MIP and VRT modes.
The patient received 80 cc of contrast.

Aorta:  Normal size.  Aortic atherosclerosis noted.  No dissection.

Aortic Valve:  Tri-leaflet.  No calcifications.

Coronary Arteries:  Normal coronary origin.  Right dominance.

Coronary calcium score of 0. This was 1st percentile for age, sex,
and race matched control.

RCA is a large dominant artery that gives rise to PDA and PLA. There
is no plaque.

Left main is a large artery that gives rise to LAD and LCX arteries.
There is no plaque.

LAD is a large vessel that gives rise to two small diagonal vessels.
There is no plaque.

LCX is a non-dominant artery that bifurcates to terminal vessel and
OM1 branch. There is no plaque.

Other findings:

Normal pulmonary vein drainage into the left atrium.

Normal left atrial appendage without a thrombus.

Normal size of the pulmonary artery.

Extra-cardiac findings: See attached radiology report for
non-cardiac structures.
IMPRESSION: 1. Coronary calcium score of 0. This was 1st percentile for age,
sex, and race matched control.

2. Normal coronary origin with right dominance.

3. CAD-RADS 0. No evidence of CAD (0%). Consider non-atherosclerotic
causes of discomfort.

4.  Aortic atherosclerosis noted.

*** End of Addendum ***
EXAM:
OVER-READ INTERPRETATION  CT CHEST

The following report is an over-read performed by radiologist Dr.
Khizar Dhillo [REDACTED] on 05/07/2020. This over-read
does not include interpretation of cardiac or coronary anatomy or
pathology. The coronary CTA interpretation by the cardiologist is
attached.
FINDINGS: Vascular: Aortic atherosclerosis. No central pulmonary embolism, on
this non-dedicated study.

Mediastinum/Nodes: No imaged thoracic adenopathy. Small hiatal
hernia.

Lungs/Pleura: No pleural fluid.  Clear imaged lungs.

Upper Abdomen: Moderate hepatic steatosis.

Musculoskeletal: Lower thoracic spondylosis.
IMPRESSION: 1. No acute findings in the imaged extracardiac chest.
2. Hepatic steatosis.
3. Small hiatal hernia.
4. Aortic Atherosclerosis (S1PIV-BAZ.Z).

## 2022-11-03 ENCOUNTER — Other Ambulatory Visit: Payer: Self-pay

## 2022-11-03 DIAGNOSIS — I82403 Acute embolism and thrombosis of unspecified deep veins of lower extremity, bilateral: Secondary | ICD-10-CM

## 2022-11-03 MED ORDER — WARFARIN SODIUM 7.5 MG PO TABS
ORAL_TABLET | ORAL | 2 refills | Status: DC
Start: 2022-11-03 — End: 2023-01-12

## 2022-11-10 ENCOUNTER — Other Ambulatory Visit: Payer: Self-pay | Admitting: Internal Medicine

## 2022-11-10 ENCOUNTER — Other Ambulatory Visit: Payer: Self-pay | Admitting: Family Medicine

## 2022-11-10 DIAGNOSIS — I5032 Chronic diastolic (congestive) heart failure: Secondary | ICD-10-CM

## 2022-11-10 DIAGNOSIS — I10 Essential (primary) hypertension: Secondary | ICD-10-CM

## 2022-11-10 DIAGNOSIS — E1169 Type 2 diabetes mellitus with other specified complication: Secondary | ICD-10-CM

## 2022-11-10 DIAGNOSIS — E559 Vitamin D deficiency, unspecified: Secondary | ICD-10-CM

## 2022-11-14 ENCOUNTER — Other Ambulatory Visit: Payer: Self-pay | Admitting: Family Medicine

## 2022-11-14 DIAGNOSIS — R0982 Postnasal drip: Secondary | ICD-10-CM

## 2022-11-15 ENCOUNTER — Ambulatory Visit: Payer: 59 | Admitting: Podiatry

## 2022-11-23 ENCOUNTER — Telehealth: Payer: Self-pay | Admitting: Family Medicine

## 2022-11-23 DIAGNOSIS — E1140 Type 2 diabetes mellitus with diabetic neuropathy, unspecified: Secondary | ICD-10-CM

## 2022-11-23 MED ORDER — SEMAGLUTIDE (1 MG/DOSE) 4 MG/3ML ~~LOC~~ SOPN
1.0000 mg | PEN_INJECTOR | SUBCUTANEOUS | 6 refills | Status: DC
Start: 2022-11-23 — End: 2022-12-22

## 2022-11-23 NOTE — Telephone Encounter (Signed)
Medication sent to pharmacy  

## 2022-11-23 NOTE — Telephone Encounter (Signed)
Patient needs a refill of Ozempic sent to [facility].

## 2022-11-27 ENCOUNTER — Other Ambulatory Visit: Payer: Self-pay | Admitting: Family Medicine

## 2022-11-27 DIAGNOSIS — K219 Gastro-esophageal reflux disease without esophagitis: Secondary | ICD-10-CM

## 2022-11-29 ENCOUNTER — Ambulatory Visit (INDEPENDENT_AMBULATORY_CARE_PROVIDER_SITE_OTHER): Payer: 59 | Admitting: Podiatry

## 2022-11-29 ENCOUNTER — Other Ambulatory Visit: Payer: Self-pay | Admitting: Family Medicine

## 2022-11-29 ENCOUNTER — Encounter: Payer: Self-pay | Admitting: Podiatry

## 2022-11-29 DIAGNOSIS — E1142 Type 2 diabetes mellitus with diabetic polyneuropathy: Secondary | ICD-10-CM | POA: Diagnosis not present

## 2022-11-29 DIAGNOSIS — B351 Tinea unguium: Secondary | ICD-10-CM

## 2022-11-29 DIAGNOSIS — M79674 Pain in right toe(s): Secondary | ICD-10-CM

## 2022-11-29 DIAGNOSIS — M546 Pain in thoracic spine: Secondary | ICD-10-CM

## 2022-11-29 DIAGNOSIS — M79675 Pain in left toe(s): Secondary | ICD-10-CM | POA: Diagnosis not present

## 2022-12-03 NOTE — Progress Notes (Signed)
Subjective:  Patient ID: [name], female    DOB: [birth year],  MRN: [id number]  67 y.o. female presents to clinic with  at risk foot care with history of diabetic neuropathy and painful elongated mycotic toenails 1-5 bilaterally which are tender when wearing enclosed shoe gear. Pain is relieved with periodic professional debridement.  Chief Complaint  Patient presents with   Diabetes    DFC BS - DIDN'T CHECK IT A1C - 5.1 LVPCP - 05/2022     New problem(s): None   PCP is [name], MD.  Allergies  Allergen Reactions   Lisinopril Anaphylaxis, Swelling and Other (See Comments)    THROAT SWELLING  Pt states her throat started to "close up" after being on it for "so long."  THROAT SWELLING, Pt states her throat started to "close up" after being on it for "so long."   Prednisone Anaphylaxis, Swelling and Other (See Comments)    Throat swelling and tongue irritation   Spironolactone Other (See Comments)    Caused stroke-like symptoms with the mouth   Corticosteroids Rash    States she developed a rash on her tongue after a steroid injection in her back    Review of Systems: Negative except as noted in the HPI.   Objective:  [name] is a pleasant 67 y.o. female in NAD.Marland Kitchen  Vascular Examination: CFT <3 seconds b/l LE. Faintly palpable DP pulses b/l LE. Diminished PT pulse(s) b/l LE. Pedal hair absent. Trace edema noted BLE. No ischemia or gangrene noted b/l LE. No cyanosis or clubbing noted b/l LE.  Neurological Examination: Pt has subjective symptoms of neuropathy. Protective sensation diminished with 10g monofilament b/l.  Dermatological Examination: Pedal skin with normal turgor, texture and tone b/l. Toenails 2-5 b/l thick, discolored, elongated with subungual debris and pain on dorsal palpation. No hyperkeratotic lesions noted b/l. Anonychia noted bilateral great toes. Nailbed(s) epithelialized.  Residual nail spicule noted medial border of left  great toe.  Musculoskeletal Examination: Muscle strength 5/5 to b/l LE. Hammertoe(s) noted to the left fourth digit. Pes planus deformity noted bilateral LE.  Radiographs: None  Last A1c:      Latest Ref Rng & Units 06/22/2022   10:54 AM 04/20/2022    8:33 AM 01/02/2022    2:50 PM  Hemoglobin A1C  Hemoglobin-A1c 4.6 - 6.5 % 7.2  7.0  9.5      Assessment:   1. Pain due to onychomycosis of toenails of both feet   2. Diabetic peripheral neuropathy associated with type 2 diabetes mellitus (HCC)     Plan:  -Patient was evaluated and treated. All patient's and/or POA's questions/concerns answered on today's visit. -Continue foot and shoe inspections daily. Monitor blood glucose per PCP/Endocrinologist's recommendations. -Patient to continue soft, supportive shoe gear daily. -Toenails were debrided in length and girth nail spicule left great toe and 2-5 bilaterally with sterile nail nippers and dremel without iatrogenic bleeding.  -Patient/POA to call should there be question/concern in the interim.  Return in about 3 months (around 02/28/2023).  Freddie Breech, DPM

## 2022-12-04 ENCOUNTER — Ambulatory Visit (INDEPENDENT_AMBULATORY_CARE_PROVIDER_SITE_OTHER): Payer: 59 | Admitting: Orthopedic Surgery

## 2022-12-04 DIAGNOSIS — M65332 Trigger finger, left middle finger: Secondary | ICD-10-CM | POA: Diagnosis not present

## 2022-12-04 DIAGNOSIS — E119 Type 2 diabetes mellitus without complications: Secondary | ICD-10-CM

## 2022-12-04 NOTE — Progress Notes (Signed)
Rebekah Joerg Thueson - 67 y.o. female MRN [id number]  Date of birth: 12-29-1955  Office Visit Note: Visit Date: 12/04/2022 PCP: [name], MD Referred by: [name], MD  Subjective: No chief complaint on file.  HPI: Jen Evatt Poche is a pleasant 67 y.o. female who presents today for follow-up regarding left thumb and long finger trigger digit.  At her prior visit approximately 6 weeks ago, she underwent injection for both.  States that the thumb has resolved, however the left long finger continues to have significant pain at the A1 pulley with associated triggering.  She is diabetic, well-controlled, recent A1c 7.2 from April of this year.    Pertinent ROS were reviewed with the patient and found to be negative unless otherwise specified above in HPI.   Assessment & Plan: Visit Diagnoses: No diagnosis found.  Plan: Extensive discussion was had with patient today regarding her left thumb and long finger trigger digits.  I am glad to see that she has had improvement of her symptoms of the left thumb from the conservative measures, however the long finger continues to have significant stenosing tenosynovitis with associated pain at the A1 pulley.  We discussed treatment options ranging from conservative to surgical.  At this juncture, given her symptoms refractory conservative care of the left long finger, she is indicated for left long finger trigger digit release under local anesthesia versus IV sedation.  She would like to have IV sedation for her procedure.  We will also obtain a new A1c today prior to surgical scheduling, [place] A1c below 8.0 to limit perioperative complications.  Risks and benefits of the procedure were discussed, risks including but not limited to infection, bleeding, scarring, stiffness, nerve injury, tendon injury, vascular injury, hardware complication if utilized, recurrence of symptoms and need for subsequent operation.  Patient expressed  understanding.       Follow-up: No follow-ups on file.   Meds & Orders: No orders of the defined types were placed in this encounter.  No orders of the defined types were placed in this encounter.    Procedures: No procedures performed      Clinical History: No specialty comments available.  She reports that she has never smoked. She has never used smokeless tobacco.  Recent Labs    01/02/22 1450 04/20/22 0833 06/22/22 1054 08/23/22 1859  HGBA1C 9.5* 7.0* 7.2*  --   LABURIC  --   --   --  6.2    Objective:   Vital Signs: There were no vitals taken for this visit.  Physical Exam  Gen: Well-appearing, in no acute distress; non-toxic CV: Regular Rate. Well-perfused. Warm.  Resp: Breathing unlabored on room air; no wheezing. Psych: Fluid speech in conversation; appropriate affect; normal thought process  Ortho Exam PHYSICAL EXAM:  General: Patient is well appearing and in no distress. Cervical spine mobility is full in all directions:  Skin and Muscle: No skin changes are apparent to upper extremities.  Muscle bulk and contour normal, no signs of atrophy.     Range of Motion and Palpation Tests:  Left hand:  Palpable nodule long finger A1 pulleys, tender to palpation with associated triggering with deep flexion.   Thumb appears resolved, no tenderness at the A1 region, appropriate flexion extension at the IP joint.   Hand is warm well-perfused, sensation intact in all distributions    Neurologic, Vascular, Motor: Sensation is intact to light touch in the median/radial/ulnar distributions. Fingers pink and well perfused.  Capillary refill is brisk.      Lab Results  Component Value Date   HGBA1C 7.2 (H) 06/22/2022     Imaging: No results found.  Past Medical/Family/Surgical/Social History: Medications & Allergies reviewed per EMR, new medications updated. Patient Active Problem List   Diagnosis Date Noted   Normocytic anemia 08/23/2022   Left  knee pain 08/23/2022   Mixed hyperlipidemia 10/07/2021   Paroxysmal SVT (supraventricular tachycardia) 10/07/2021   Allergic rhinitis 11/29/2020   Allergic rhinitis due to pollen 11/29/2020   Moderate persistent asthma without complication 11/29/2020   Osteoarthritis of spine with radiculopathy, lumbar region 08/24/2020   Chronic back pain 08/18/2020   Obesity, diabetes, and hypertension syndrome (HCC) 02/27/2020   Vitreomacular adhesion of both eyes 02/09/2020   Pseudophakia of both eyes 02/09/2020   Chronic diastolic CHF (congestive heart failure) (HCC) 03/12/2019   Hypernatremia 03/09/2019   Suspected COVID-19 virus infection 03/09/2019   Thyroid mass 02/27/2017   Chronic pain of right knee 05/24/2016   Effusion, right knee 04/28/2016   Presence of right artificial knee joint 04/28/2016   Abnormal LFTs    Decreased sensation    Paresthesia 06/12/2014   Obstruction of kidney 04/24/2014   Diabetes type 2, uncontrolled 04/24/2014   Chronic anticoagulation 04/24/2014   Crack cocaine use 04/24/2014   Depression 04/24/2014   Fibromyalgia 04/24/2014   OSA (obstructive sleep apnea) 04/24/2014   Lupus anticoagulant positive 04/24/2014   Adrenal mass, right (HCC) 04/24/2014   Right kidney mass 04/24/2014   Hydronephrosis of right kidney 04/24/2014   Supratherapeutic INR 04/24/2014   Renal mass, right 04/24/2014   Diabetes mellitus with coincident hypertension (HCC)    Right lower quadrant abdominal pain    Morbid obesity (HCC) 08/21/2013   Constipation 08/19/2013   Lumbosacral spondylosis without myelopathy 11/19/2012   Bilateral deep vein thromboses (HCC) 12/13/2011   Hypokalemia 11/05/2011   Sciatica 11/05/2011   HTN (hypertension) 11/05/2011   Past Medical History:  Diagnosis Date   Adrenal mass (HCC) 03/226   Benign   Arthritis    knees/multiple orthopedic conditons; lower back   Asthma    per pt   Clotting disorder (HCC)    +beta-2-glycoprotein IgA antibody   COPD  (chronic obstructive pulmonary disease) (HCC)    inhalers dependent on environment   Depression    Diabetes mellitus    120s usually fasting -  dx more than 10 yrs ago   Dizziness, nonspecific    DVT (deep venous thrombosis) (HCC)    Recurrent   Fibromyalgia    GERD (gastroesophageal reflux disease)    Heart murmur    History of blood transfusion    History of cocaine abuse (HCC)    Remote history    Hyperlipidemia    Hypertension    takes meds daily   Hypothyroidism    Lupus Anticoagulant Positive    On home oxygen therapy    at night   Shortness of breath    exertion or lying flat   Sleep apnea    2l of oxygen at night (as of 12/6, she used to)   Family History  Problem Relation Age of Onset   Hypertension Mother    Diabetes Mother    Cancer Mother        Pancreatic   Hypertension Father    Heart failure Father    Hyperlipidemia Other    COPD Other    Past Surgical History:  Procedure Laterality Date   ABDOMINAL HYSTERECTOMY  1989  Fibroids   ANTERIOR LUMBAR FUSION  01/03/2012   Procedure: ANTERIOR LUMBAR FUSION 1 LEVEL;  Surgeon: Carmela Hurt, MD;  Location: [facility];  Service: Neurosurgery;  Laterality: N/A;  Lumbar Four-Five Anterior Lumbar Interbody Fusion with Instrumentation   BACK SURGERY     cervical spine---disk disease   CARPAL TUNNEL RELEASE     Bilateral   CESAREAN SECTION     CHOLECYSTECTOMY     CYSTOSCOPY/RETROGRADE/URETEROSCOPY/STONE EXTRACTION WITH BASKET Right 04/26/2014   Procedure: CYSTOSCOPY/ RIGHT RETROGRADE/ RIGHT URETEROSCOPY/URETERAL AND RENAL PELVIS BIOPSY;  Surgeon: Sebastian Ache, MD;  Location: [facility];  Service: Urology;  Laterality: Right;   ESOPHAGOGASTRODUODENOSCOPY (EGD) WITH PROPOFOL N/A 08/25/2022   Procedure: ESOPHAGOGASTRODUODENOSCOPY (EGD) WITH PROPOFOL;  Surgeon: Jeani Hawking, MD;  Location: [facility];  Service: Gastroenterology;  Laterality: N/A;   gallstones removed     HEMOSTASIS CLIP PLACEMENT  08/25/2022    Procedure: HEMOSTASIS CLIP PLACEMENT;  Surgeon: Jeani Hawking, MD;  Location: [facility];  Service: Gastroenterology;;   HOT HEMOSTASIS N/A 08/25/2022   Procedure: HOT HEMOSTASIS (ARGON PLASMA COAGULATION/BICAP);  Surgeon: Jeani Hawking, MD;  Location: [facility];  Service: Gastroenterology;  Laterality: N/A;   POLYPECTOMY  08/25/2022   Procedure: POLYPECTOMY;  Surgeon: Jeani Hawking, MD;  Location: [facility];  Service: Gastroenterology;;   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   bilateral   right elbow surgery     RIGHT HEART CATH N/A 02/28/2021   Procedure: RIGHT HEART CATH;  Surgeon: Kathleene Hazel, MD;  Location: [facility] CV [facility];  Service: Cardiovascular;  Laterality: N/A;   THYROID LOBECTOMY Right 02/27/2017   THYROID LOBECTOMY Right 02/27/2017   Procedure: RIGHT THYROID LOBECTOMY;  Surgeon: Harriette Bouillon, MD;  Location: [facility];  Service: General;  Laterality: Right;   TUBAL LIGATION     Social History   Occupational History   Not on file  Tobacco Use   Smoking status: Never   Smokeless tobacco: Never  Vaping Use   Vaping status: Never Used  Substance and Sexual Activity   Alcohol use: No    Alcohol/week: 0.0 standard drinks of alcohol    Comment: beer in past    Drug use: Yes    Types: Cocaine    Comment: quit 2003-rehab progam in 2005   Sexual activity: Never    Aniceto Kyser Fara Boros) Denese Killings, M.D. [facility] 9:15 AM

## 2022-12-04 NOTE — Progress Notes (Signed)
Doing some better; States still having pain

## 2022-12-04 NOTE — Addendum Note (Signed)
Addended by: Rip Harbour on: 12/04/2022 09:41 AM   Modules accepted: Orders

## 2022-12-05 LAB — HEMOGLOBIN A1C
Hgb A1c MFr Bld: 7.5 %{Hb} — ABNORMAL HIGH (ref <5.7)
Mean Plasma Glucose: 169 mg/dL
eAG (mmol/L): 9.3 mmol/L

## 2022-12-08 DIAGNOSIS — G4733 Obstructive sleep apnea (adult) (pediatric): Secondary | ICD-10-CM | POA: Diagnosis not present

## 2022-12-15 ENCOUNTER — Ambulatory Visit: Payer: 59 | Admitting: Family Medicine

## 2022-12-16 ENCOUNTER — Other Ambulatory Visit: Payer: Self-pay | Admitting: Family Medicine

## 2022-12-16 DIAGNOSIS — K219 Gastro-esophageal reflux disease without esophagitis: Secondary | ICD-10-CM

## 2022-12-21 IMAGING — US US THYROID
1 series · 13 of 25 positions shown · non-contrast
Comparison: 10/17/2016, 01/23/2017

CLINICAL DATA: Prior ultrasound follow-up.

EXAM:
THYROID ULTRASOUND
TECHNIQUE: Ultrasound examination of the thyroid gland and adjacent soft
tissues was performed.

[Series 1: us thyroid · 0.08mm/px · 13 of 57 slices shown]
[im 1/57]
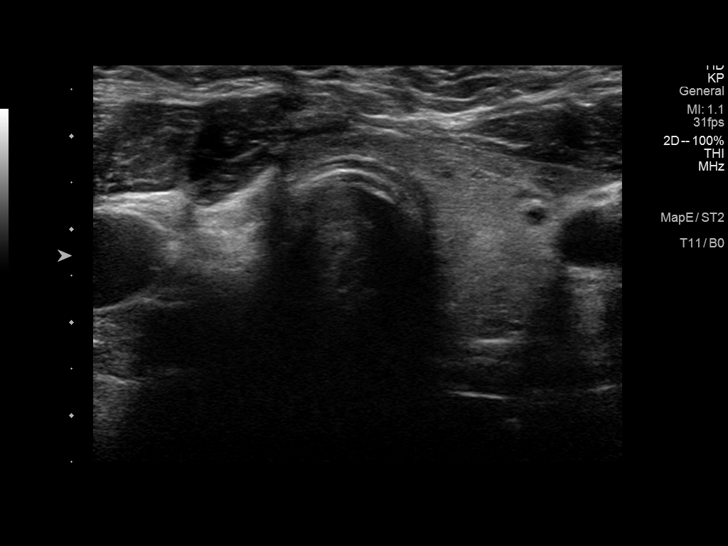
[im 5/57]
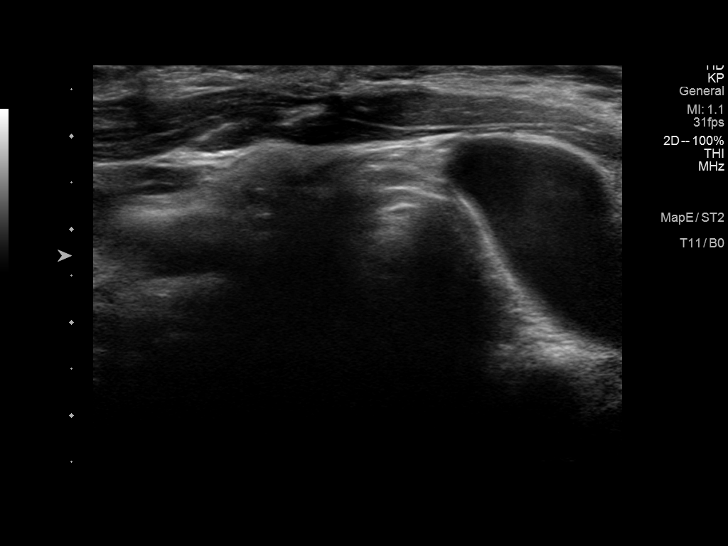
[im 10/57]
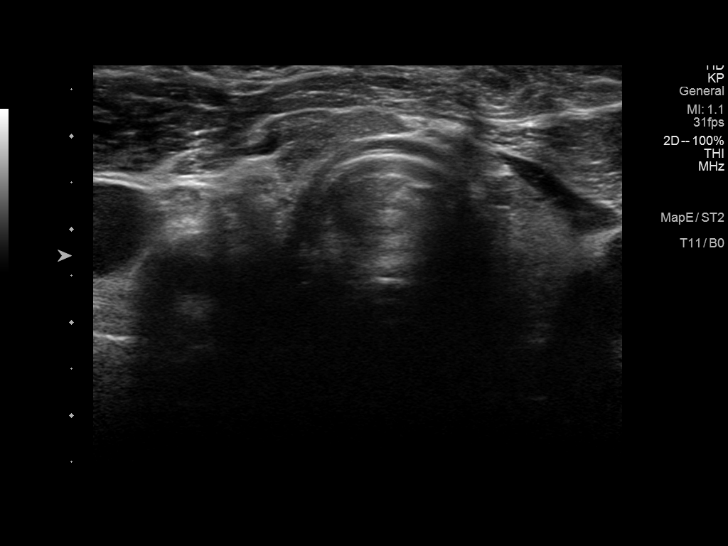
[im 15/57]
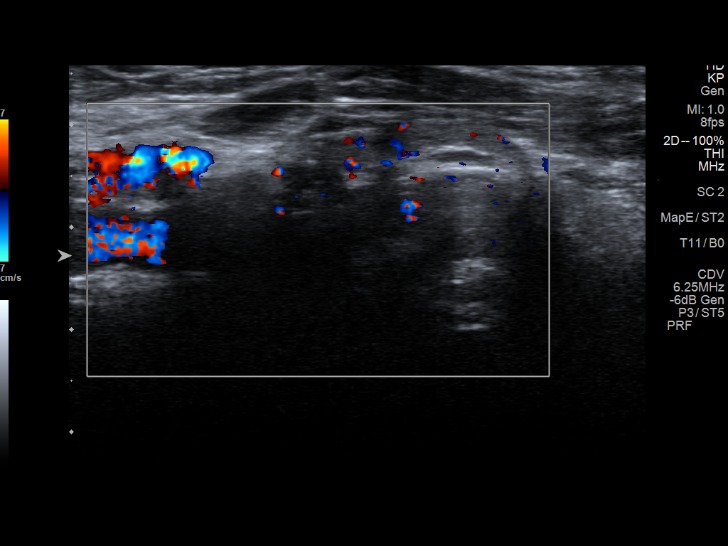
[im 19/57]
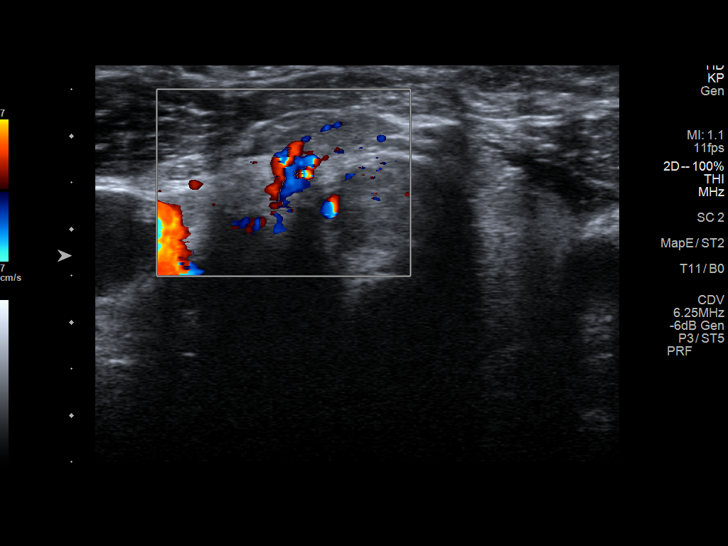
[im 24/57]
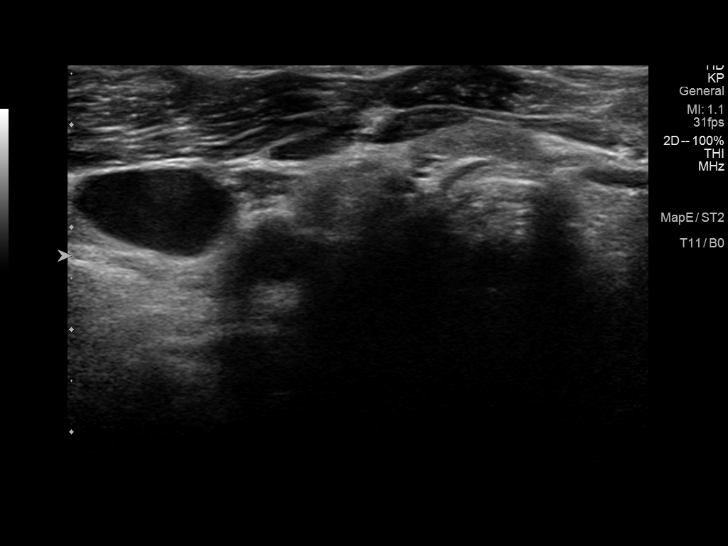
[im 29/57]
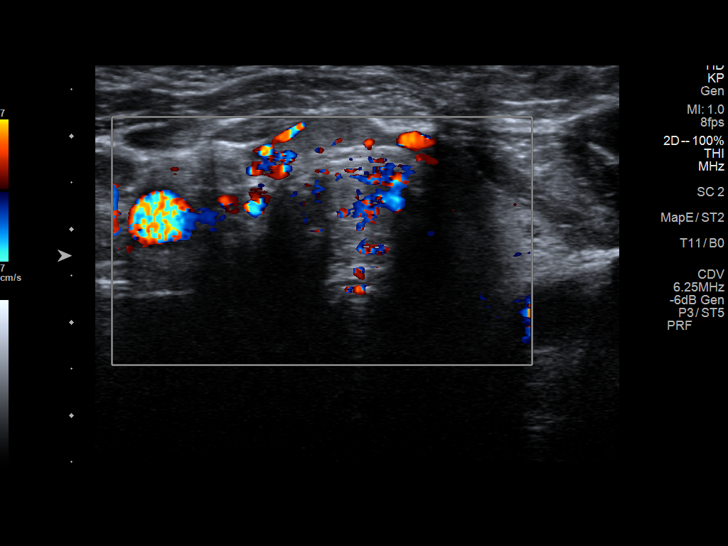
[im 33/57]
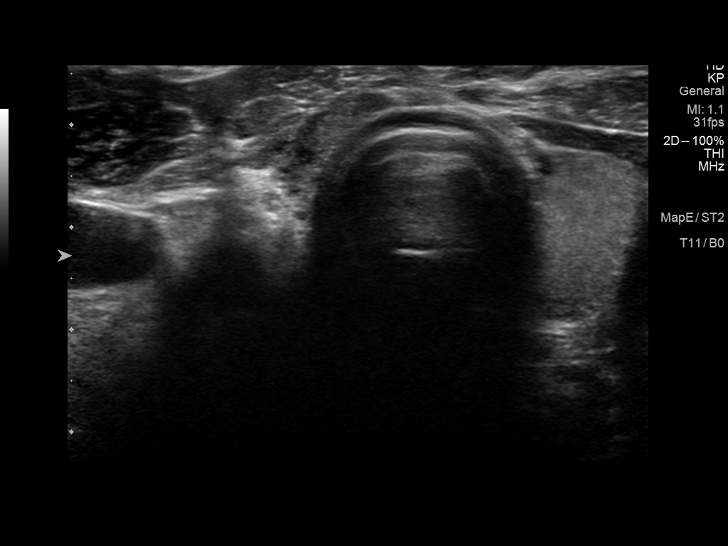
[im 38/57]
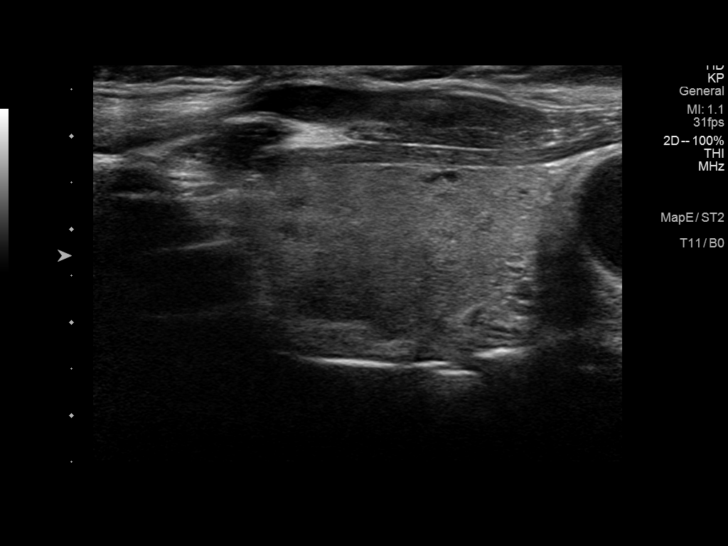
[im 43/57]
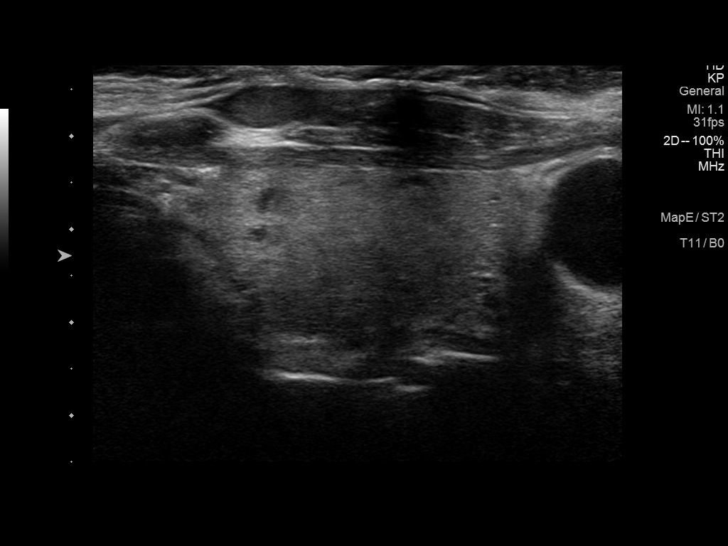
[im 47/57]
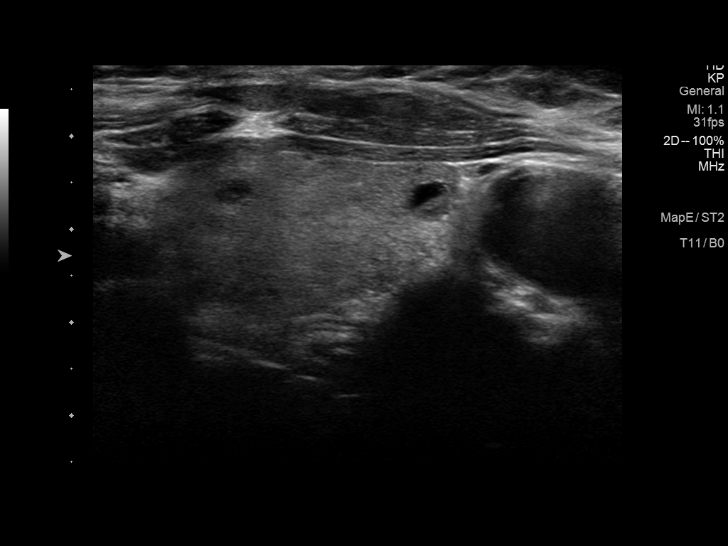
[im 52/57]
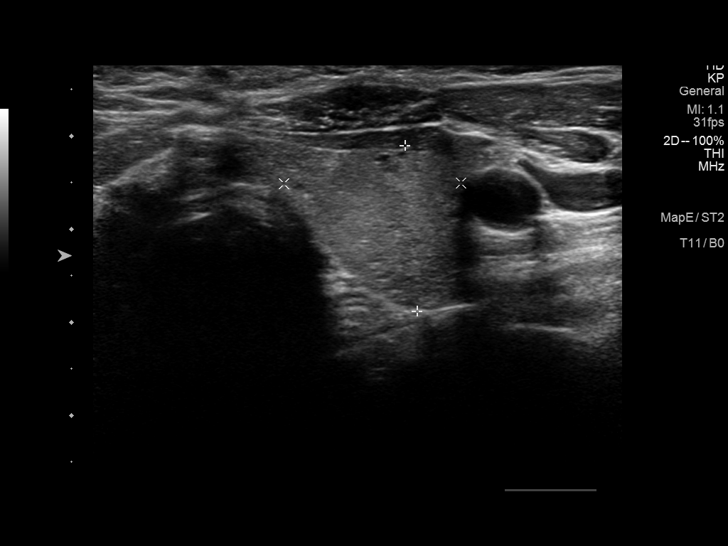
[im 57/57]
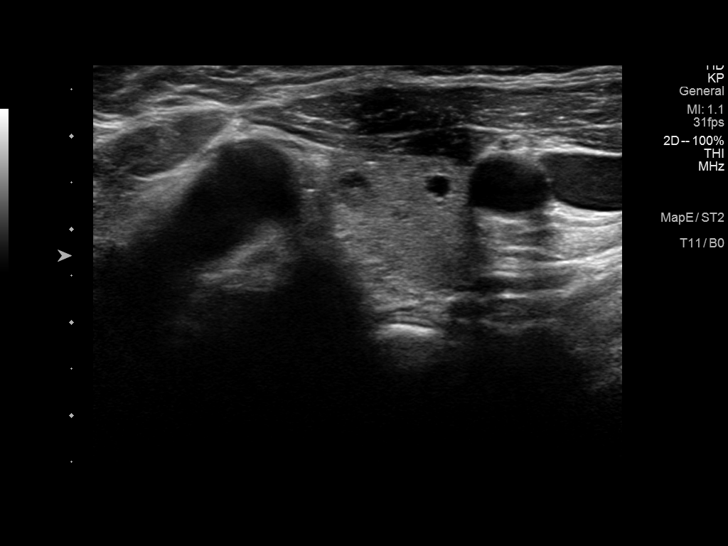

[13 of 25 positions shown; findings below may reference images not displayed]

FINDINGS: Parenchymal Echotexture: Mildly heterogenous

Isthmus: 0.3 cm

Right lobe: Postsurgical changes after right hemithyroidectomy.
There is a small amount of remnant soft tissue in the surgical bed
measuring approximately 1.4 x 1.0 x 1.0 cm

Left lobe: 3.5 x 1.8 x 1.9 cm

_________________________________________________________

Estimated total number of nodules >/= 1 cm: 1

Number of spongiform nodules >/=  2 cm not described below (TR1): 0

Number of mixed cystic and solid nodules >/= 1.5 cm not described
below (TR2): 0

_________________________________________________________

Nodule # 1:

Location: Left; Mid

Maximum size: 1.5 cm; Other 2 dimensions: 0.9 x 0.9 cm

Composition: solid/almost completely solid (2)

Echogenicity: hypoechoic (2)

Shape: not taller-than-wide (0)

Margins: ill-defined (0)

Echogenic foci: none (0)

ACR TI-RADS total points: 4.

ACR TI-RADS risk category: TR4 (4-6 points).

ACR TI-RADS recommendations:

**Given size (>/= 1.5 cm) and appearance, fine needle aspiration of
this moderately suspicious nodule should be considered based on
TI-RADS criteria.

_________________________________________________________
IMPRESSION: 1. Solid nodule in the left mid thyroid (labeled 1, 1.5 cm) meets
criteria (TI-RADS category 4) for tissue sampling. Recommend
ultrasound-guided fine-needle aspiration.
2. Postsurgical changes after right hemithyroidectomy with small
volume residual soft tissue in the thyroid bed. No cervical
lymphadenopathy.

The above is in keeping with the ACR TI-RADS recommendations - [HOSPITAL] 8313;[DATE].

## 2022-12-22 ENCOUNTER — Ambulatory Visit (INDEPENDENT_AMBULATORY_CARE_PROVIDER_SITE_OTHER): Payer: 59 | Admitting: Family Medicine

## 2022-12-22 ENCOUNTER — Encounter: Payer: Self-pay | Admitting: Family Medicine

## 2022-12-22 VITALS — BP 140/90 | HR 94 | Temp 99.0°F | Ht 66.0 in | Wt 286.0 lb

## 2022-12-22 DIAGNOSIS — Z7985 Long-term (current) use of injectable non-insulin antidiabetic drugs: Secondary | ICD-10-CM

## 2022-12-22 DIAGNOSIS — M65312 Trigger thumb, left thumb: Secondary | ICD-10-CM | POA: Diagnosis not present

## 2022-12-22 DIAGNOSIS — E1140 Type 2 diabetes mellitus with diabetic neuropathy, unspecified: Secondary | ICD-10-CM | POA: Diagnosis not present

## 2022-12-22 DIAGNOSIS — M65332 Trigger finger, left middle finger: Secondary | ICD-10-CM

## 2022-12-22 DIAGNOSIS — Z23 Encounter for immunization: Secondary | ICD-10-CM

## 2022-12-22 DIAGNOSIS — Z01818 Encounter for other preprocedural examination: Secondary | ICD-10-CM | POA: Diagnosis not present

## 2022-12-22 DIAGNOSIS — D500 Iron deficiency anemia secondary to blood loss (chronic): Secondary | ICD-10-CM

## 2022-12-22 MED ORDER — OZEMPIC (2 MG/DOSE) 8 MG/3ML ~~LOC~~ SOPN
2.0000 mg | PEN_INJECTOR | SUBCUTANEOUS | 3 refills | Status: DC
Start: 2022-12-22 — End: 2023-11-09

## 2022-12-22 NOTE — Progress Notes (Signed)
Established Patient Office Visit   Subjective  Patient ID: [name], female    DOB: [birth year]  Age: 67 y.o. MRN: [id number]  Chief Complaint  Patient presents with   Pre-op Exam    Patient is a 67 year old female seen for follow-up on chronic conditions and preop paperwork.  Patient to have surgery on left first and third digits for triggering.  Patient states blood sugar has been elevated, 210.  Working on diet.  Has noted increased SOB, fatigue, dizziness.  Not currently on iron supplements.  Had history of GIB.  Stools dark in color but pt attributes to recent increase in vegetables.  Patient with wound on left lower abdomen.  States was worse but is healing.  Unsure how long has been present.  States skin is itchy.  Has applied antibiotic gel and alcohol to area.  No longer draining pus.  Patient denies fever, chills, nausea, vomiting.    Patient Active Problem List   Diagnosis Date Noted   Generalized anxiety disorder 10/19/2022   Major depressive disorder, recurrent, moderate (HCC) 10/19/2022   Normocytic anemia 08/23/2022   Left knee pain 08/23/2022   Mixed hyperlipidemia 10/07/2021   Paroxysmal SVT (supraventricular tachycardia) (HCC) 10/07/2021   Allergic rhinitis 11/29/2020   Allergic rhinitis due to pollen 11/29/2020   Moderate persistent asthma without complication 11/29/2020   Osteoarthritis of spine with radiculopathy, lumbar region 08/24/2020   Chronic back pain 08/18/2020   Obesity, diabetes, and hypertension syndrome (HCC) 02/27/2020   Vitreomacular adhesion of both eyes 02/09/2020   Pseudophakia of both eyes 02/09/2020   Chronic diastolic CHF (congestive heart failure) (HCC) 03/12/2019   Hypernatremia 03/09/2019   Suspected COVID-19 virus infection 03/09/2019   Thyroid mass 02/27/2017   Chronic pain of right knee 05/24/2016   Effusion, right knee 04/28/2016   Presence of right artificial knee joint 04/28/2016   Abnormal LFTs    Decreased sensation     Paresthesia 06/12/2014   Obstruction of kidney 04/24/2014   Chronic anticoagulation 04/24/2014   Crack cocaine use 04/24/2014   Depression 04/24/2014   Fibromyalgia 04/24/2014   Obstructive sleep apnea on CPAP 04/24/2014   Lupus anticoagulant positive 04/24/2014   Adrenal mass, right (HCC) 04/24/2014   Right kidney mass 04/24/2014   Hydronephrosis of right kidney 04/24/2014   Supratherapeutic INR 04/24/2014   Renal mass 04/24/2014   Chronic obstructive pulmonary disease (HCC) 04/24/2014   Diabetes mellitus with coincident hypertension (HCC)    Right lower quadrant abdominal pain    Morbid obesity (HCC) 08/21/2013   Constipation 08/19/2013   Lumbosacral spondylosis without myelopathy 11/19/2012   Morbid obesity with BMI of 45.0-49.9, adult (HCC) 01/05/2012   Bilateral deep vein thromboses (HCC) 12/13/2011   Hypokalemia 11/05/2011   Sciatica 11/05/2011   Hypertension 11/05/2011   Diabetes type 2, controlled (HCC) 11/05/2011   Past Medical History:  Diagnosis Date   Adrenal mass (HCC) 03/226   Benign   Arthritis    knees/multiple orthopedic conditons; lower back   Asthma    per pt   Clotting disorder (HCC)    +beta-2-glycoprotein IgA antibody   COPD (chronic obstructive pulmonary disease) (HCC)    inhalers dependent on environment   Depression    Diabetes mellitus    120s usually fasting -  dx more than 10 yrs ago   Dizziness, nonspecific    DVT (deep venous thrombosis) (HCC)    Recurrent   Fibromyalgia    GERD (gastroesophageal reflux disease)    Heart murmur  History of blood transfusion    History of cocaine abuse (HCC)    Remote history    Hyperlipidemia    Hypertension    takes meds daily   Hypothyroidism    Lupus Anticoagulant Positive    On home oxygen therapy    at night   Shortness of breath    exertion or lying flat   Sleep apnea    2l of oxygen at night (as of 12/6, she used to)   Past Surgical History:  Procedure Laterality Date    ABDOMINAL HYSTERECTOMY  1989   Fibroids   ANTERIOR LUMBAR FUSION  01/03/2012   Procedure: ANTERIOR LUMBAR FUSION 1 LEVEL;  Surgeon: Carmela Hurt, MD;  Location: [facility];  Service: Neurosurgery;  Laterality: N/A;  Lumbar Four-Five Anterior Lumbar Interbody Fusion with Instrumentation   BACK SURGERY     cervical spine---disk disease   CARPAL TUNNEL RELEASE     Bilateral   CESAREAN SECTION     CHOLECYSTECTOMY     CYSTOSCOPY/RETROGRADE/URETEROSCOPY/STONE EXTRACTION WITH BASKET Right 04/26/2014   Procedure: CYSTOSCOPY/ RIGHT RETROGRADE/ RIGHT URETEROSCOPY/URETERAL AND RENAL PELVIS BIOPSY;  Surgeon: Sebastian Ache, MD;  Location: [facility];  Service: Urology;  Laterality: Right;   ESOPHAGOGASTRODUODENOSCOPY (EGD) WITH PROPOFOL N/A 08/25/2022   Procedure: ESOPHAGOGASTRODUODENOSCOPY (EGD) WITH PROPOFOL;  Surgeon: Jeani Hawking, MD;  Location: [facility];  Service: Gastroenterology;  Laterality: N/A;   gallstones removed     HEMOSTASIS CLIP PLACEMENT  08/25/2022   Procedure: HEMOSTASIS CLIP PLACEMENT;  Surgeon: Jeani Hawking, MD;  Location: [facility];  Service: Gastroenterology;;   HOT HEMOSTASIS N/A 08/25/2022   Procedure: HOT HEMOSTASIS (ARGON PLASMA COAGULATION/BICAP);  Surgeon: Jeani Hawking, MD;  Location: [facility];  Service: Gastroenterology;  Laterality: N/A;   POLYPECTOMY  08/25/2022   Procedure: POLYPECTOMY;  Surgeon: Jeani Hawking, MD;  Location: [facility];  Service: Gastroenterology;;   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   bilateral   right elbow surgery     RIGHT HEART CATH N/A 02/28/2021   Procedure: RIGHT HEART CATH;  Surgeon: Kathleene Hazel, MD;  Location: [facility]AS[facility] CV [facility];  Service: Cardiovascular;  Laterality: N/A;   THYROID LOBECTOMY Right 02/27/2017   THYROID LOBECTOMY Right 02/27/2017   Procedure: RIGHT THYROID LOBECTOMY;  Surgeon: Harriette Bouillon, MD;  Location: [facility];  Service: General;  Laterality: Right;   TUBAL LIGATION     Social History   Tobacco Use    Smoking status: Never   Smokeless tobacco: Never  Vaping Use   Vaping status: Never Used  Substance Use Topics   Alcohol use: No    Alcohol/week: 0.0 standard drinks of alcohol    Comment: beer in past    Drug use: Yes    Types: Cocaine    Comment: quit 2003-rehab progam in 2005   Family History  Problem Relation Age of Onset   Hypertension Mother    Diabetes Mother    Cancer Mother        Pancreatic   Hypertension Father    Heart failure Father    Hyperlipidemia Other    COPD Other    Allergies  Allergen Reactions   Lisinopril Anaphylaxis, Swelling and Other (See Comments)    THROAT SWELLING  Pt states her throat started to "close up" after being on it for "so long."  THROAT SWELLING, Pt states her throat started to "close up" after being on it for "so long."   Prednisone Anaphylaxis, Swelling and Other (See Comments)    Throat  swelling and tongue irritation   Spironolactone Other (See Comments)    Caused stroke-like symptoms with the mouth   Corticosteroids Rash    States she developed a rash on her tongue after a steroid injection in her back      ROS Negative unless stated above    Objective:     BP (!) 140/90 (BP Location: Right Arm, Patient Position: Sitting, Cuff Size: Large) Comment: Patient has not taken her BP meds this morning  Pulse 94   Temp 99 F (37.2 C) (Oral)   Ht 5\' 6"  (1.676 m)   Wt 286 lb (129.7 kg)   SpO2 96%   BMI 46.16 kg/m    Physical Exam Constitutional:      General: She is not in acute distress.    Appearance: Normal appearance.  HENT:     Head: Normocephalic and atraumatic.     Nose: Nose normal.     Mouth/Throat:     Mouth: Mucous membranes are moist.  Cardiovascular:     Rate and Rhythm: Normal rate and regular rhythm.     Heart sounds: Normal heart sounds. No murmur heard.    No gallop.  Pulmonary:     Effort: Pulmonary effort is normal. No respiratory distress.     Breath sounds: Normal breath sounds. No  wheezing, rhonchi or rales.  Musculoskeletal:     Comments: TTP of left palmar surface.  Triggering of the left first and third digits.  Skin:    General: Skin is warm and dry.          Comments: Healing ulcer/abrasion of left lower abdomen superior to groin.  Hyperpigmentation of the area with dry appearance.  No purulent drainage, increased warmth, erythema, or induration.  Neurological:     Mental Status: She is alert and oriented to person, place, and time.      No results found for any visits on 12/22/22.    Assessment & Plan:  Preop examination  Type 2 diabetes mellitus with diabetic neuropathy, without long-term current use of insulin (HCC) -     Ozempic (2 MG/DOSE); Inject 2 mg into the skin once a week.  Dispense: 9 mL; Refill: 3  Need for influenza vaccination -     Flu Vaccine Trivalent High Dose (Fluad)  Iron deficiency anemia due to chronic blood loss -     CBC with Differential/Platelet; Future -     Iron, TIBC and Ferritin Panel; Future  Trigger finger of left thumb  Trigger middle finger of left hand  Patient seen for preop eval and follow-up on chronic conditions.  Will need to stop Ozempic 2 weeks prior to trigger finger release due to anesthesia risk.  Will complete form for surgery.  Will obtain labs Monday to evaluate for anemia as no lab available in clinic.  Discussed increasing Ozempic from 1 mg to 2 mg weekly.  Continue other medications.  Continue lifestyle modifications.  Encouraged to keep follow-up with cardiology in the next few months.  Return in about 3 months (around 03/24/2023), or if symptoms worsen or fail to improve.   [name], MD

## 2022-12-25 ENCOUNTER — Other Ambulatory Visit (INDEPENDENT_AMBULATORY_CARE_PROVIDER_SITE_OTHER): Payer: 59

## 2022-12-25 ENCOUNTER — Other Ambulatory Visit: Payer: 59

## 2022-12-25 DIAGNOSIS — D500 Iron deficiency anemia secondary to blood loss (chronic): Secondary | ICD-10-CM | POA: Diagnosis not present

## 2022-12-25 LAB — CBC WITH DIFFERENTIAL/PLATELET
Basophils Absolute: 0 10*3/uL (ref 0.0–0.1)
Basophils Relative: 0.4 % (ref 0.0–3.0)
Eosinophils Absolute: 0.1 10*3/uL (ref 0.0–0.7)
Eosinophils Relative: 2.3 % (ref 0.0–5.0)
HCT: 37.6 % (ref 36.0–46.0)
Hemoglobin: 11.8 g/dL — ABNORMAL LOW (ref 12.0–15.0)
Lymphocytes Relative: 32.6 % (ref 12.0–46.0)
Lymphs Abs: 1.9 10*3/uL (ref 0.7–4.0)
MCHC: 31.5 g/dL (ref 30.0–36.0)
MCV: 80.2 fL (ref 78.0–100.0)
Monocytes Absolute: 0.5 10*3/uL (ref 0.1–1.0)
Monocytes Relative: 7.9 % (ref 3.0–12.0)
Neutro Abs: 3.3 10*3/uL (ref 1.4–7.7)
Neutrophils Relative %: 56.8 % (ref 43.0–77.0)
Platelets: 343 10*3/uL (ref 150.0–400.0)
RBC: 4.69 Mil/uL (ref 3.87–5.11)
RDW: 17.6 % — ABNORMAL HIGH (ref 11.5–15.5)
WBC: 5.8 10*3/uL (ref 4.0–10.5)

## 2022-12-26 LAB — IRON,TIBC AND FERRITIN PANEL
%SAT: 14 % — ABNORMAL LOW (ref 16–45)
Ferritin: 26 ng/mL (ref 16–288)
Iron: 50 ug/dL (ref 45–160)
TIBC: 358 ug/dL (ref 250–450)

## 2023-01-02 DIAGNOSIS — J449 Chronic obstructive pulmonary disease, unspecified: Secondary | ICD-10-CM | POA: Diagnosis not present

## 2023-01-05 IMAGING — US US THYROID
1 series · 8 of 8 positions shown · non-contrast
Comparison: Thyroid ultrasound-06/29/2020

CLINICAL DATA: History of right thyroid lobectomy, now with
indeterminate nodule within the left lobe of the thyroid meeting
criteria for ultrasound-guided biopsy.

EXAM:
ULTRASOUND OF HEAD/NECK SOFT TISSUES
TECHNIQUE: Ultrasound examination of the head and neck soft tissues was
performed in the area of clinical concern.

[Series 1: us thyroid · 0.06mm/px · 8 acquisitions, 8 frames shown]
[im 1/8]
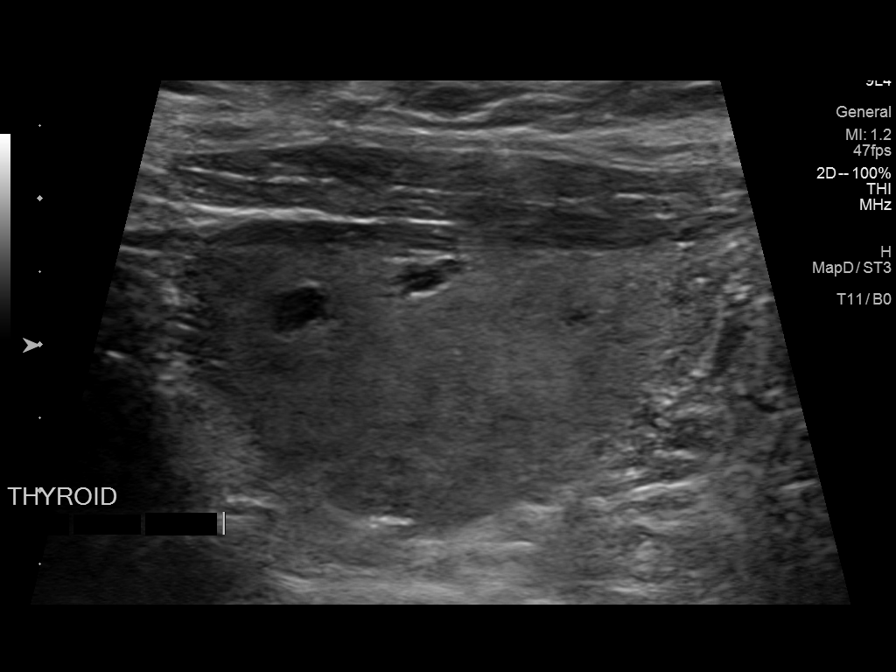
[im 2/8]
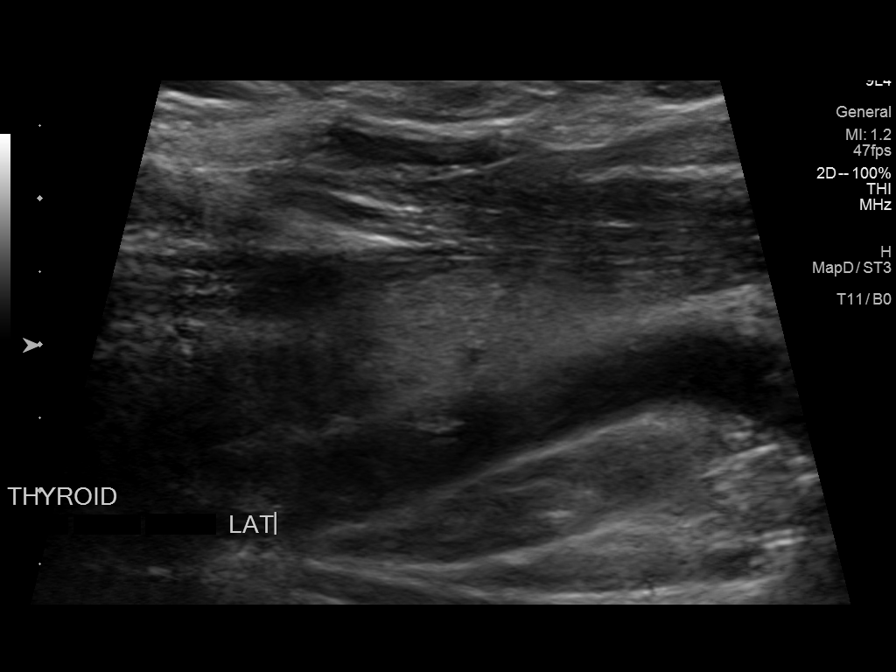
[im 3/8]
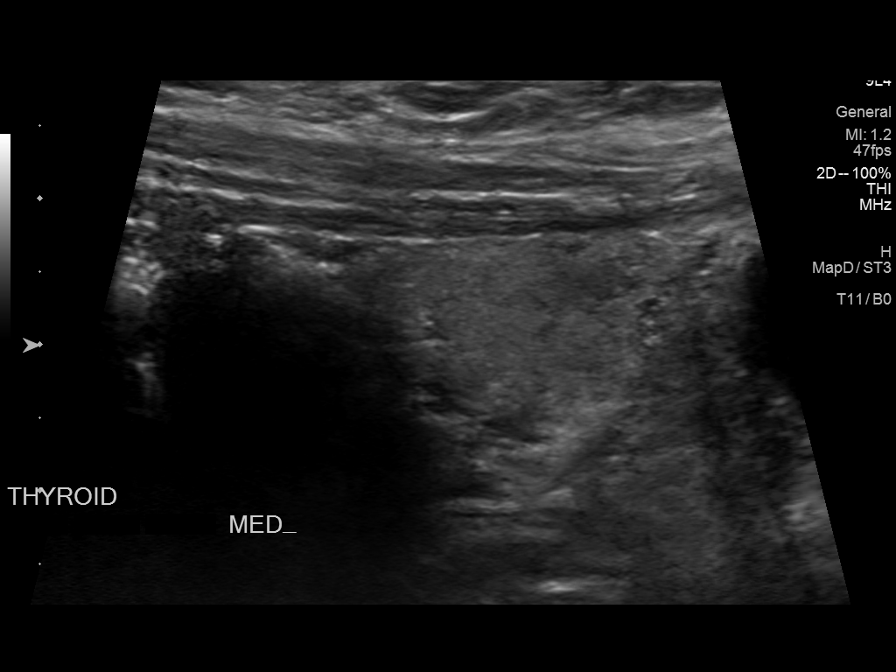
[im 4/8]
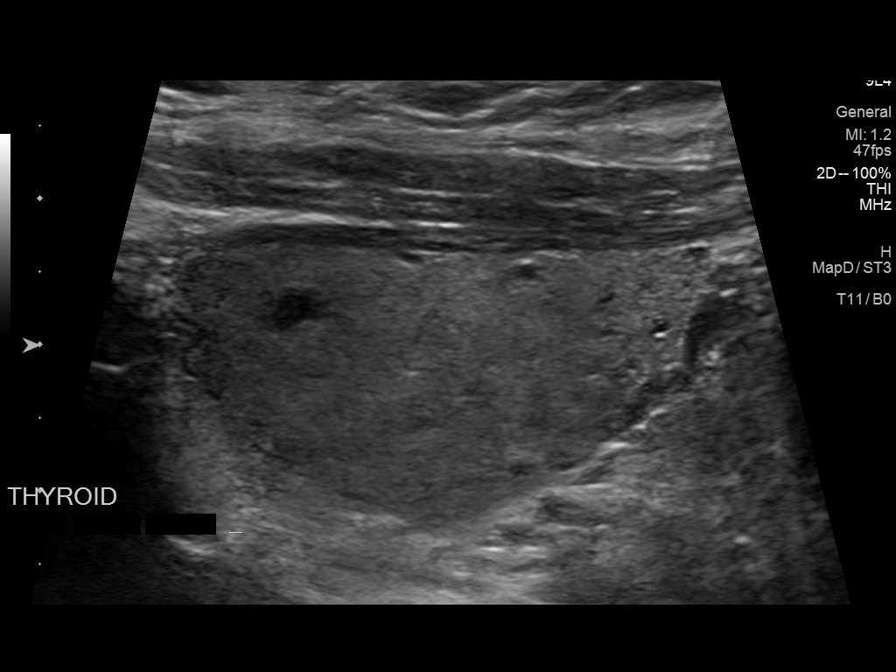
[im 5/8]
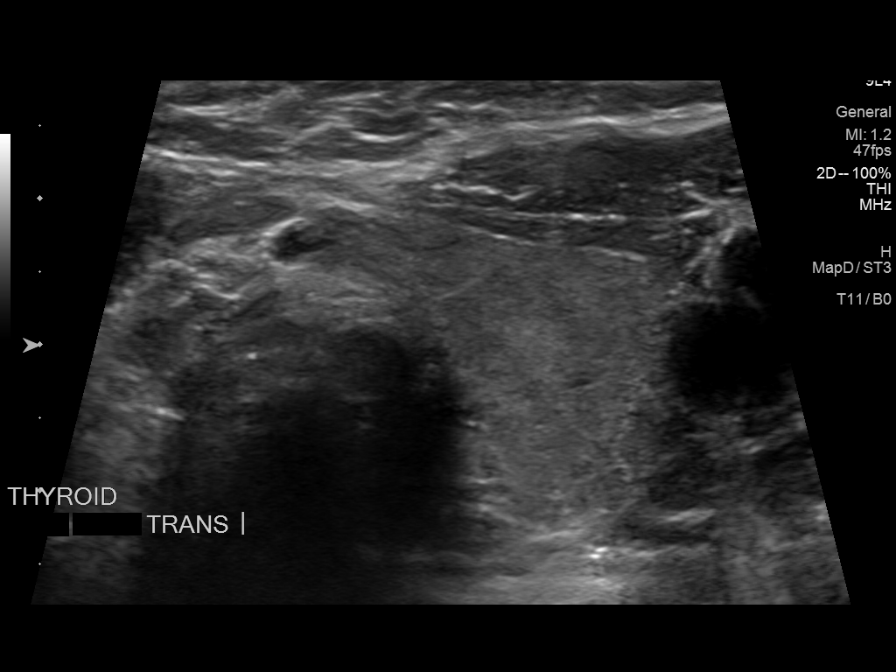
[im 6/8]
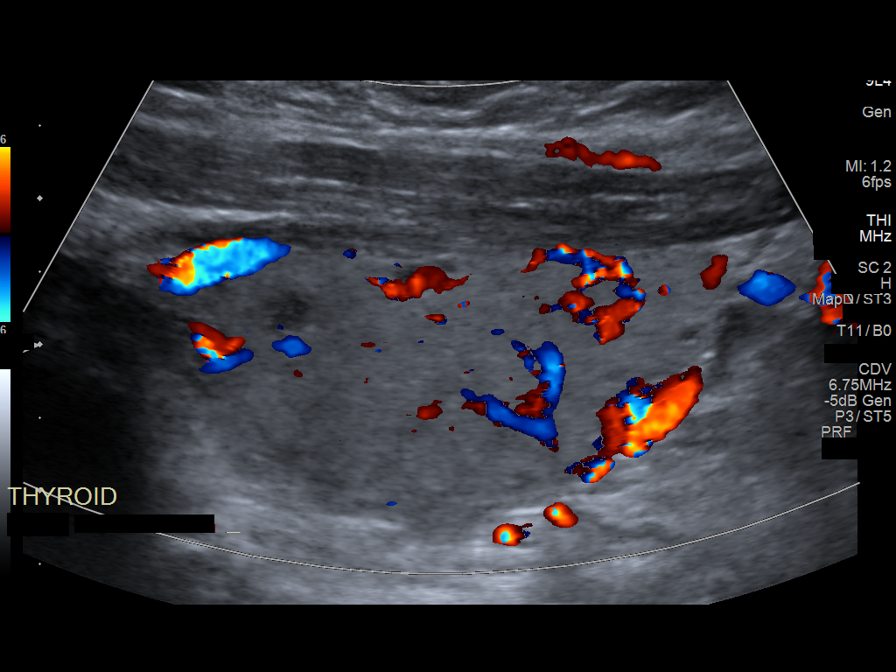
[im 7/8]
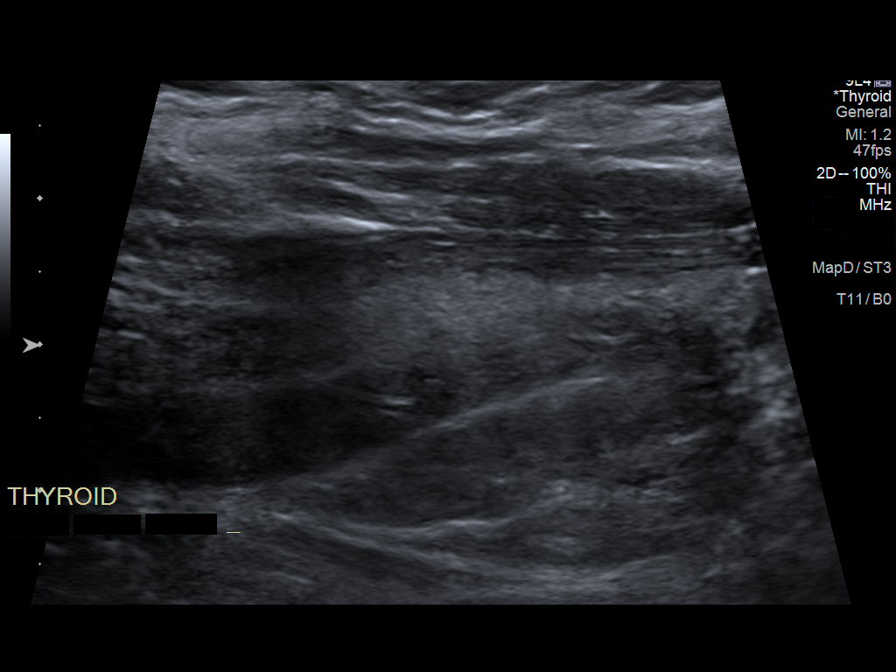
[im 8/8]
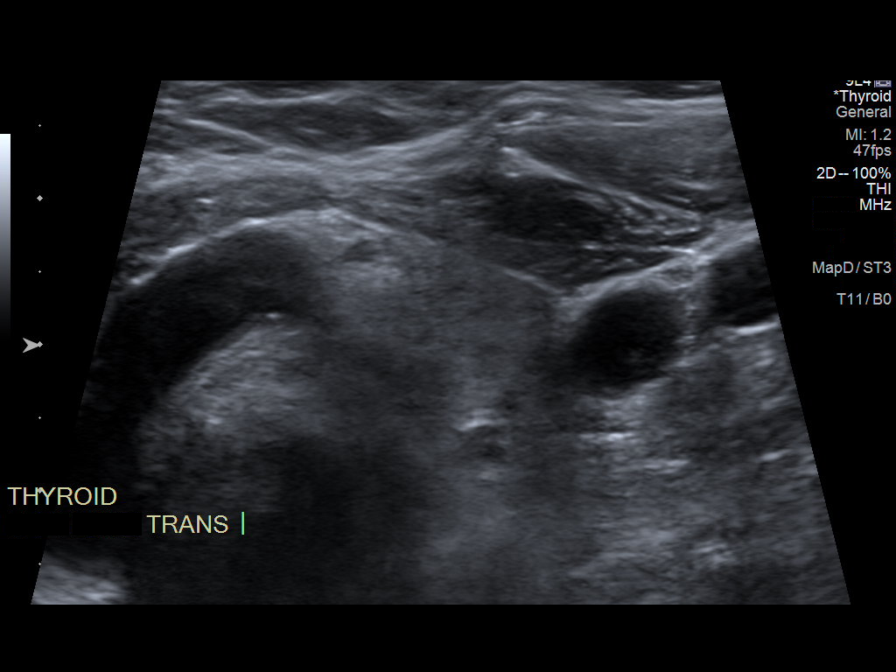

[8 of 8 positions shown; findings below may reference images not displayed]

FINDINGS: Sonographic evaluation of the left lobe of the thyroid performed by
the dictating interventional radiologist failed to replicate the
ill-defined approximately 1.5 cm hypoechoic nodule within the mid,
posterior aspect of the left lobe of the thyroid which is favored to
have represented a pseudonodule.

As such, a biopsy was not performed.
IMPRESSION: Questioned 1.5 cm hypoechoic nodule with the mid, posterior aspect
of the left lobe of the thyroid is not identified on today's
examination and thus favored to represent a pseudonodule.

No biopsy performed.

## 2023-01-09 ENCOUNTER — Telehealth: Payer: Self-pay

## 2023-01-09 NOTE — Telephone Encounter (Signed)
The patient contacted [place] to inform us that she is scheduled for surgery on her hand to remove a growth. She inquired about when to discontinue her Coumadin and when to resume the medication. I recommended that she consult with her surgeon to discuss this matter further.

## 2023-01-11 ENCOUNTER — Other Ambulatory Visit: Payer: Self-pay | Admitting: Family Medicine

## 2023-01-11 ENCOUNTER — Other Ambulatory Visit: Payer: Self-pay | Admitting: Oncology

## 2023-01-11 ENCOUNTER — Other Ambulatory Visit: Payer: Self-pay | Admitting: Internal Medicine

## 2023-01-11 DIAGNOSIS — K219 Gastro-esophageal reflux disease without esophagitis: Secondary | ICD-10-CM

## 2023-01-11 DIAGNOSIS — I82403 Acute embolism and thrombosis of unspecified deep veins of lower extremity, bilateral: Secondary | ICD-10-CM

## 2023-01-12 ENCOUNTER — Other Ambulatory Visit: Payer: Self-pay | Admitting: *Deleted

## 2023-01-12 MED ORDER — WARFARIN SODIUM 10 MG PO TABS: ORAL_TABLET | ORAL | 5 refills | Status: DC

## 2023-01-12 NOTE — Telephone Encounter (Signed)
Received refill request for warfarin 7.5 mg. Confirmed with patient she is taking 7.5 mg daily, except 10 mg on MWF. She is almost out of the 10 mg tablets. This was refilled as well.

## 2023-01-16 DIAGNOSIS — M65332 Trigger finger, left middle finger: Secondary | ICD-10-CM | POA: Diagnosis not present

## 2023-01-17 ENCOUNTER — Telehealth: Payer: Self-pay | Admitting: Orthopedic Surgery

## 2023-01-17 NOTE — Telephone Encounter (Signed)
Patient called and ask if she can get something for pain for her hand. CB#[phone]

## 2023-01-18 ENCOUNTER — Other Ambulatory Visit: Payer: Self-pay | Admitting: Orthopaedic Surgery

## 2023-01-18 DIAGNOSIS — M546 Pain in thoracic spine: Secondary | ICD-10-CM

## 2023-01-18 MED ORDER — TRAMADOL HCL 50 MG PO TABS: 50.0000 mg | ORAL_TABLET | Freq: Three times a day (TID) | ORAL | 0 refills | Status: DC | PRN

## 2023-01-19 DIAGNOSIS — G4733 Obstructive sleep apnea (adult) (pediatric): Secondary | ICD-10-CM | POA: Diagnosis not present

## 2023-01-22 ENCOUNTER — Ambulatory Visit: Payer: 59

## 2023-01-25 ENCOUNTER — Inpatient Hospital Stay: Payer: 59 | Admitting: Oncology

## 2023-01-25 ENCOUNTER — Inpatient Hospital Stay: Payer: 59

## 2023-01-31 ENCOUNTER — Ambulatory Visit: Payer: 59 | Attending: Internal Medicine

## 2023-01-31 ENCOUNTER — Ambulatory Visit (INDEPENDENT_AMBULATORY_CARE_PROVIDER_SITE_OTHER): Payer: 59 | Admitting: Orthopedic Surgery

## 2023-01-31 DIAGNOSIS — M65332 Trigger finger, left middle finger: Secondary | ICD-10-CM

## 2023-01-31 DIAGNOSIS — E782 Mixed hyperlipidemia: Secondary | ICD-10-CM | POA: Diagnosis not present

## 2023-01-31 NOTE — Progress Notes (Signed)
   [name] - 67 y.o. female MRN [id number]  Date of birth: [birth year]  Office Visit Note: Visit Date: 01/31/2023 PCP: [name], MD Referred by: [name], MD  Subjective:  HPI: [name] is a 67 y.o. female who presents today for follow up 2 weeks status post left long finger trigger release.  She is doing well overall, pain is well-controlled, does have some ongoing stiffness at the long finger.  Pertinent ROS were reviewed with the patient and found to be negative unless otherwise specified above in HPI.   Assessment & Plan: Visit Diagnoses: No diagnosis found.  Plan: Sutures removed today.  Referral placed occupational therapy for range of motion exercises with transition to strengthening as appropriate.  Can transition to home exercise program as well.  Follow-up in approximate 4 weeks to track progress.  Follow-up: No follow-ups on file.   Meds & Orders: No orders of the defined types were placed in this encounter.  No orders of the defined types were placed in this encounter.    Procedures: No procedures performed       Objective:   Vital Signs: There were no vitals taken for this visit.  Ortho Exam Left hand: - Well-healing incision at the base of the long finger, skin is well-approximated, no erythema or drainage - Able to perform digital range of motion without residual clicking or locking - Sensation intact distally, hand is warm well-perfused   Imaging: No results found.   Janeil Schexnayder Trevor Mace, M.D. [facility] 8:47 AM

## 2023-02-01 LAB — ALT: ALT: 14 [IU]/L (ref 0–32)

## 2023-02-01 LAB — LIPID PANEL
Chol/HDL Ratio: 2.9 ratio (ref 0.0–4.4)
Cholesterol, Total: 212 mg/dL — ABNORMAL HIGH (ref 100–199)
HDL: 74 mg/dL (ref >39)
LDL Chol Calc (NIH): 115 mg/dL — ABNORMAL HIGH (ref 0–99)
Triglycerides: 133 mg/dL (ref 0–149)
VLDL Cholesterol Cal: 23 mg/dL (ref 5–40)

## 2023-02-02 ENCOUNTER — Telehealth: Payer: Self-pay

## 2023-02-02 DIAGNOSIS — E782 Mixed hyperlipidemia: Secondary | ICD-10-CM

## 2023-02-02 MED ORDER — ROSUVASTATIN CALCIUM 10 MG PO TABS: 10.0000 mg | ORAL_TABLET | Freq: Every day | ORAL | 3 refills | Status: DC

## 2023-02-02 NOTE — Telephone Encounter (Signed)
-----   Message from [name] sent at 02/02/2023  3:04 PM EST ----- Results: LDL has improved but is still above Baldini Plan: Rosuvastatin 10 mg and labs in three months   Colleen Constant, MD

## 2023-02-02 NOTE — Telephone Encounter (Signed)
The patient has been notified of the result and verbalized understanding.  All questions (if any) were answered. Arvid Right Colleen Schoenfelder, RN 02/02/2023 4:21 PM   Pt reports medications come in a pill pack does not know which one is rosuvastatin.  Advised pt to call pharmacy to find out which pill is rosuvastatin.  I have sent an updated prescription for rosuvastatin 10 mg PO every day to [facility].  Pt advised to have fasting labs in Feb 2025; we no longer make appointments pt to come in on her own timing.  Orders placed and released for draw.  All questions answered.

## 2023-02-05 ENCOUNTER — Other Ambulatory Visit: Payer: 59

## 2023-02-05 DIAGNOSIS — E119 Type 2 diabetes mellitus without complications: Secondary | ICD-10-CM

## 2023-02-05 NOTE — Progress Notes (Signed)
02/05/2023 Name: [name] MRN: [id number] DOB: 11-20-1955  Chief Complaint  Patient presents with   Diabetes   Hypertension   Hyperlipidemia   Medication Management    Sallee Unruh Ealey is a 67 y.o. year old female who presented for a telephone visit.   They were referred to the pharmacist by their PCP for assistance in managing diabetes, hypertension, and complex medication management.    Subjective:  Care Team: Primary Care Provider: [name], MD   Medication Access/Adherence  Current Pharmacy:  [facility] - [address] [place] - [address] [place] [address] Phone: [phone] Fax: [phone]  [facility] - [place], [place] - [address] [place] [address] Phone: [phone] Fax: [phone]  [id number] - [place], [place] - [address] AT [facility] [address] [place] [address] Phone: [phone] Fax: [phone]   Patient reports affordability concerns with their medications: No  Patient reports access/transportation concerns to their pharmacy: No  Patient reports adherence concerns with their medications:  No     Diabetes:  Current medications: Metformin 500mg  1 at bedtime, Ozempic 2mg  once weekly, Invokana 100mg  1 tab daily (was stopped when starting ozempic but patient admits she has sporadically taken here and there because she is still receiving it, has not taken for a few weeks at this appt) Medications tried in the past: None  Current glucose readings: 225 fasting, seeing numbers in the 190s-low 200s consistently Testing BID  Patient denies hypoglycemic s/sx including dizziness, shakiness, sweating. Patient denies hyperglycemic symptoms including polyuria, polydipsia, polyphagia, nocturia, neuropathy, blurred vision.    Hyperlipidemia/ASCVD Risk Reduction  Current  lipid lowering medications: Rosuvastatin 10mg  daily Medications tried in the past: None   Objective:  Lab Results  Component Value Date   HGBA1C 7.5 (H) 12/04/2022    Lab Results  Component Value Date   CREATININE 0.76 09/11/2022   BUN 11 09/11/2022   NA 136 09/11/2022   K 3.8 09/11/2022   CL 102 09/11/2022   CO2 28 09/11/2022    Lab Results  Component Value Date   CHOL 212 (H) 01/31/2023   HDL 74 01/31/2023   LDLCALC 115 (H) 01/31/2023   TRIG 133 01/31/2023   CHOLHDL 2.9 01/31/2023    Medications Reviewed Today     Reviewed by Sherrill Raring, RPH (Pharmacist) on 02/05/23 at 0932  Med List Status: <None>   Medication Order Taking? Sig Documenting Provider Last Dose Status Informant  ACCU-CHEK GUIDE test strip [id number]  USE TO check blood glucose UP TO four times daily AS DIRECTED [name], MD  Active Self  Accu-Chek Softclix Lancets lancets [id number]  USE TO check blood glucose UP TO four times daily AS DIRECTED [name], MD  Active Self  albuterol (PROVENTIL) (2.5 MG/3ML) 0.083% nebulizer solution [id number] Yes USE 1 VIAL IN NEBULIZER EVERY 6 HOURS - and as needed [name], MD Taking Active Self  albuterol (VENTOLIN HFA) 108 (90 Base) MCG/ACT inhaler [id number] Yes INHALE TWO PUFFS BY MOUTH INTO LUNGS every FOUR hours AS NEEDED FOR SHORTNESS OF Georgena Spurling, MD Taking Active Self  ALLERGY RELIEF 180 MG tablet [id number] Yes TAKE 1 TABLET BY MOUTH ONCE DAILY *REFILL REQUESTDeeann Saint, MD Taking Active   blood glucose meter kit and supplies KIT [id number]  Dispense based on patient and insurance preference.  Use up to four times daily as directed. (FOR ICD-9 250.00, 250.01). [name], MD  Active Self  canagliflozin [facility]) 100 MG TABS tablet [id number] No Take 1 tablet (100 mg total) by mouth daily before breakfast.  Patient not taking: Reported on 02/05/2023   Christell Constant, MD Not Taking Active   cloNIDine  (CATAPRES) 0.1 MG tablet [id number] Yes TAKE ONE TABLET BY MOUTH BEFORE BREAKFAST and TAKE ONE TABLET BY MOUTH EVERYDAY AT BEDTIME [name], MD Taking Active Self  diclofenac sodium (VOLTAREN) 1 % GEL [id number] Yes Apply 2 g topically daily as needed (fibromyalgia pain- affected sites). [provider] Taking Active Self  diltiazem (CARDIZEM CD) 240 MG 24 hr capsule [id number] Yes TAKE 1 CAPSULE BY MOUTH DAILY BEFORE BREAKFAST *REFILL REQUEST* [name], MD Taking Active   DULoxetine (CYMBALTA) 30 MG capsule [id number] Yes Take 90 mg by mouth at bedtime. [provider] Taking Active Self  FEROSUL 325 (65 Fe) MG tablet [id number] No TAKE ONE TABLET BY MOUTH ONCE DAILY  Patient not taking: Reported on 02/05/2023   [name], MD Not Taking Active Self  fluticasone (FLONASE) 50 MCG/ACT nasal spray [id number] Yes Place 2 sprays into both nostrils 2 (two) times daily.  Patient taking differently: Place 2 sprays into both nostrils 2 (two) times daily as needed for allergies or rhinitis.   [name], MD Taking Active Self  furosemide (LASIX) 80 MG tablet [id number] Yes TAKE ONE (1) TABLET BY MOUTH TWICE DAILY Chandrasekhar, [name] A, MD Taking Active   gabapentin (NEURONTIN) 300 MG capsule [id number] Yes TAKE ONE CAPSULE BY MOUTH TWICE DAILY [name], MD Taking Active   Incontinence Supply Disposable (PROCARE BARIATRIC BRIEFS) MISC [id number]  Use as needed. [name], MD  Active Self  lidocaine (LIDODERM) 5 % [id number] Yes Place 1 patch onto the skin daily. Remove & Discard patch within 12 hours or as directed by MD  Patient taking differently: Place 1 patch onto the skin daily as needed (to painful site- Remove & Discard patch within 12 hours or as directed by MD).   Blue, Soijett A, PA-C Taking Active Self  LINZESS 145 MCG CAPS capsule [id number] Yes TAKE ONE CAPSULE BY MOUTH every other DAY [name], MD Taking Active   Magnesium Oxide 400 MG  CAPS [id number] Yes Take 1 capsule (400 mg total) by mouth daily. Prosperi, Christian H, PA-C Taking Active   metFORMIN (GLUCOPHAGE) 500 MG tablet [id number] Yes TAKE 1 TABLET BY MOUTH EVERY DAY AT BEDTIME *REFILL REQUEST* [name], MD Taking Active   methocarbamol (ROBAXIN) 500 MG tablet [id number] Yes Take 1 tablet (500 mg total) by mouth 2 (two) times daily.  Patient taking differently: Take 500 mg by mouth 2 (two) times daily as needed for muscle spasms.   Blue, Soijett A, PA-C Taking Active Self  omeprazole (PRILOSEC) 20 MG capsule [id number] Yes TAKE 1 CAPSULE BY MOUTH TWICE DAILY BEFORE BREAKFAST AND EACH EVENING *SCHEDULE ANNUAL OFFICE VISIT FOR MORE REFILLS* [name], MD Taking Active   potassium chloride (MICRO-K) 10 MEQ CR capsule [id number] Yes TAKE TWO CAPSULES BY MOUTH EVERYDAY AT BEDTIME [name], MD Taking Active Self  QUEtiapine (SEROQUEL) 50 MG tablet [id number] Yes Take 50 mg by mouth in the morning and at bedtime. [provider] Taking Active Self  rosuvastatin (CRESTOR) 10 MG tablet [id number] Yes Take 1 tablet (10 mg total) by mouth daily. Christell Constant, MD Taking Active  Semaglutide, 2 MG/DOSE, (OZEMPIC, 2 MG/DOSE,) 8 MG/3ML SOPN [id number] Yes Inject 2 mg into the skin once a week. [name], MD Taking Active            Med Note Barnie Mort Feb 05, 2023  9:30 AM) On Mondays  sodium chloride flush (NS) 0.9 % injection 3 mL [id number]   Riley Lam A, MD  Active   spironolactone (ALDACTONE) 25 MG tablet [id number] Yes TAKE 1/2 TABLET BY MOUTH ONCE DAILY Chandrasekhar, [name] A, MD Taking Active   SYMBICORT 160-4.5 MCG/ACT inhaler [id number] Yes INHALE TWO PUFFS BY MOUTH INTO LUNGS twice daily  Patient taking differently: Inhale 2 puffs into the lungs 2 (two) times daily.   [name], MD Taking Active Self  traMADol Janean Sark) 50 MG tablet [id number] Yes Take 1 tablet (50 mg total) by mouth 3 (three) times daily  as needed. Kathryne Hitch, MD Taking Active   Vitamin D, Ergocalciferol, (DRISDOL) 1.25 MG (50000 UNIT) CAPS capsule [id number] No Take 1 capsule (50,000 Units total) by mouth every 7 (seven) days.  Patient not taking: Reported on 02/05/2023   [name], MD Not Taking Active Self           Med Note Henreitta Leber, Adelina Mings Oct 12, 2022 10:30 AM)    warfarin (COUMADIN) 10 MG tablet [id number] Yes Take daily as directed by MD Ladene Artist, MD Taking Active            Med Note Barnie Mort Feb 05, 2023  9:31 AM) MWF  warfarin (COUMADIN) 7.5 MG tablet [id number] Yes TAKE 1 TABLET BY MOUTH DAILY AS DIRECTED Ladene Artist, MD Taking Active   zonisamide (ZONEGRAN) 100 MG capsule [id number] Yes Take 100-200 mg by mouth See admin instructions. Take 100 mg by mouth in the morning and 200 mg in the evening [provider] Taking Active Self              Assessment/Plan:   Diabetes: - Currently uncontrolled - Reviewed long term cardiovascular and renal outcomes of uncontrolled blood sugar - Reviewed Blankenbaker A1c, Matheney fasting, and Smithhart 2 hour post prandial glucose - Reviewed dietary modifications including low carb diet - Recommend to restart Invokana 100mg  consistently  - Recommend to check glucose BID      Hyperlipidemia/ASCVD Risk Reduction: - Currently uncontrolled.  - Reviewed long term complications of uncontrolled cholesterol - Reviewed dietary recommendations including limiting fried/fatty foods - Confirmed patient has increased to Rosuvastatin 10mg  as instructed by cardio on 11/15   Follow Up Plan: 2 weeks  Sherrill Raring, PharmD Clinical Pharmacist [phone]

## 2023-02-09 ENCOUNTER — Other Ambulatory Visit: Payer: Self-pay | Admitting: Oncology

## 2023-02-09 ENCOUNTER — Other Ambulatory Visit: Payer: Self-pay | Admitting: Internal Medicine

## 2023-02-09 ENCOUNTER — Other Ambulatory Visit: Payer: Self-pay | Admitting: Family Medicine

## 2023-02-09 DIAGNOSIS — J4540 Moderate persistent asthma, uncomplicated: Secondary | ICD-10-CM

## 2023-02-09 DIAGNOSIS — K219 Gastro-esophageal reflux disease without esophagitis: Secondary | ICD-10-CM

## 2023-02-09 DIAGNOSIS — I82403 Acute embolism and thrombosis of unspecified deep veins of lower extremity, bilateral: Secondary | ICD-10-CM

## 2023-02-09 IMAGING — CR DG LUMBAR SPINE COMPLETE 4+V
5 series · 5 of 5 positions shown · non-contrast
Comparison: 03/24/2013

CLINICAL DATA: Low back pain

EXAM:
LUMBAR SPINE - COMPLETE 4+ VIEW

[l-spine ap]
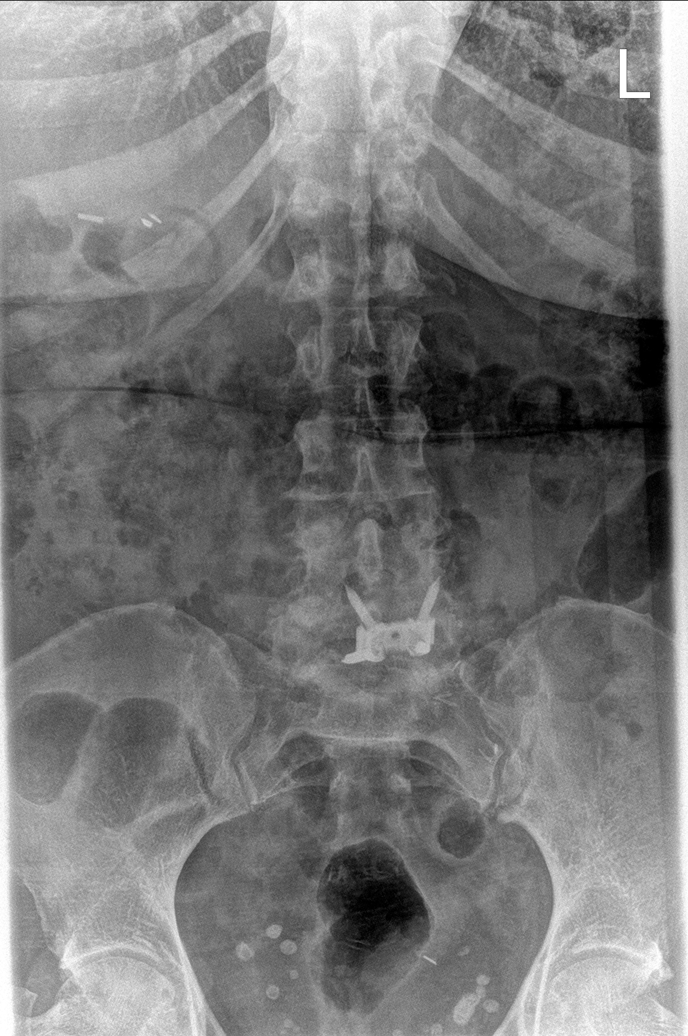

[l-spine obl (1 of 2)]
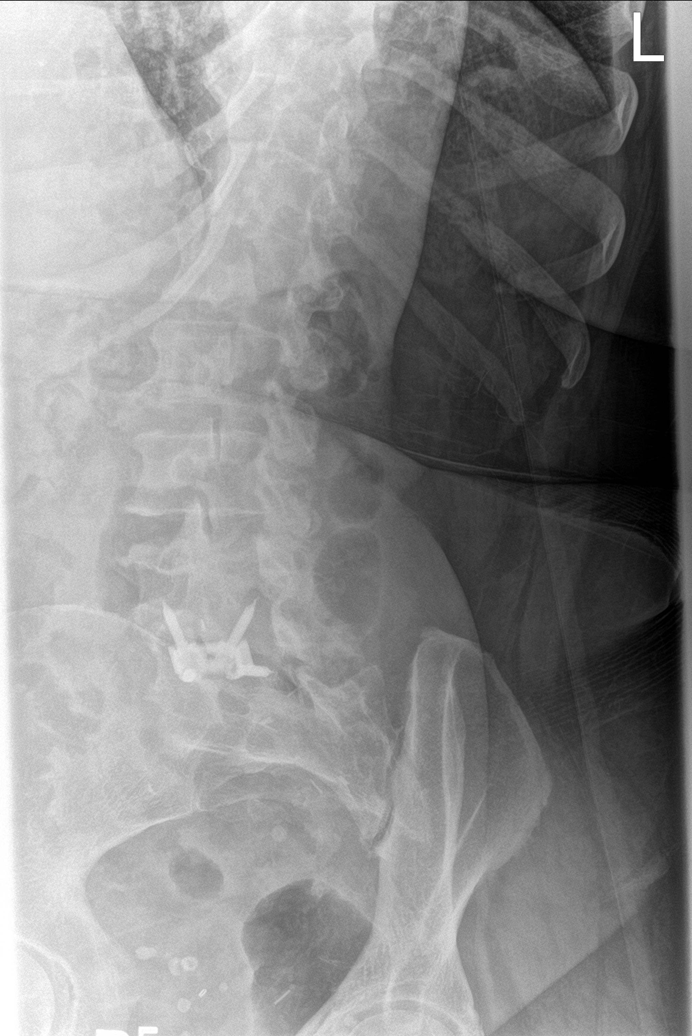

[l-spine obl (2 of 2)]
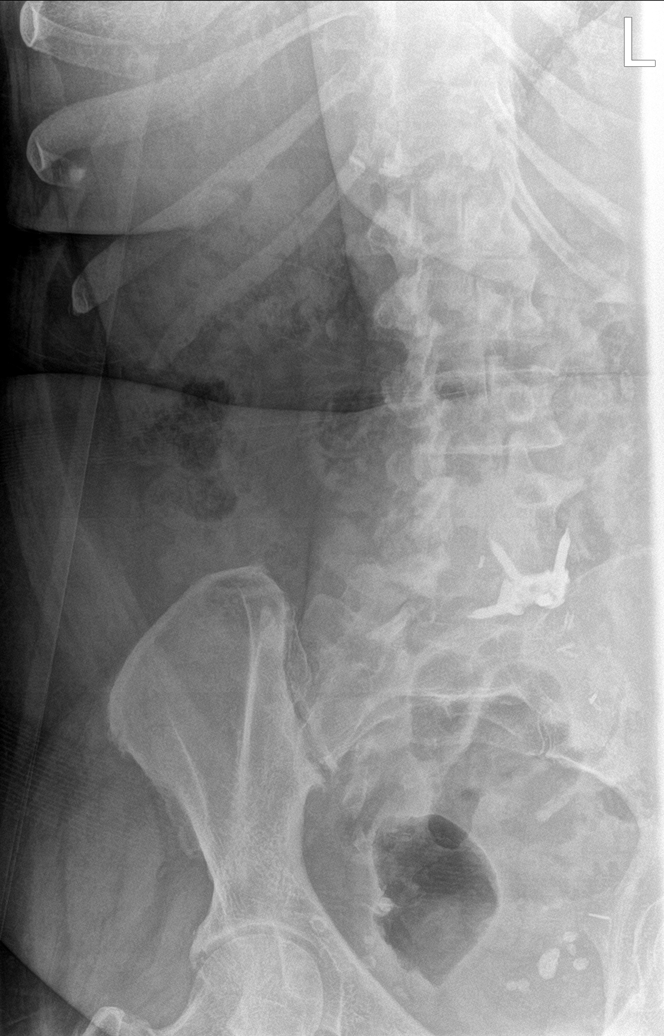

[l-spine spot]
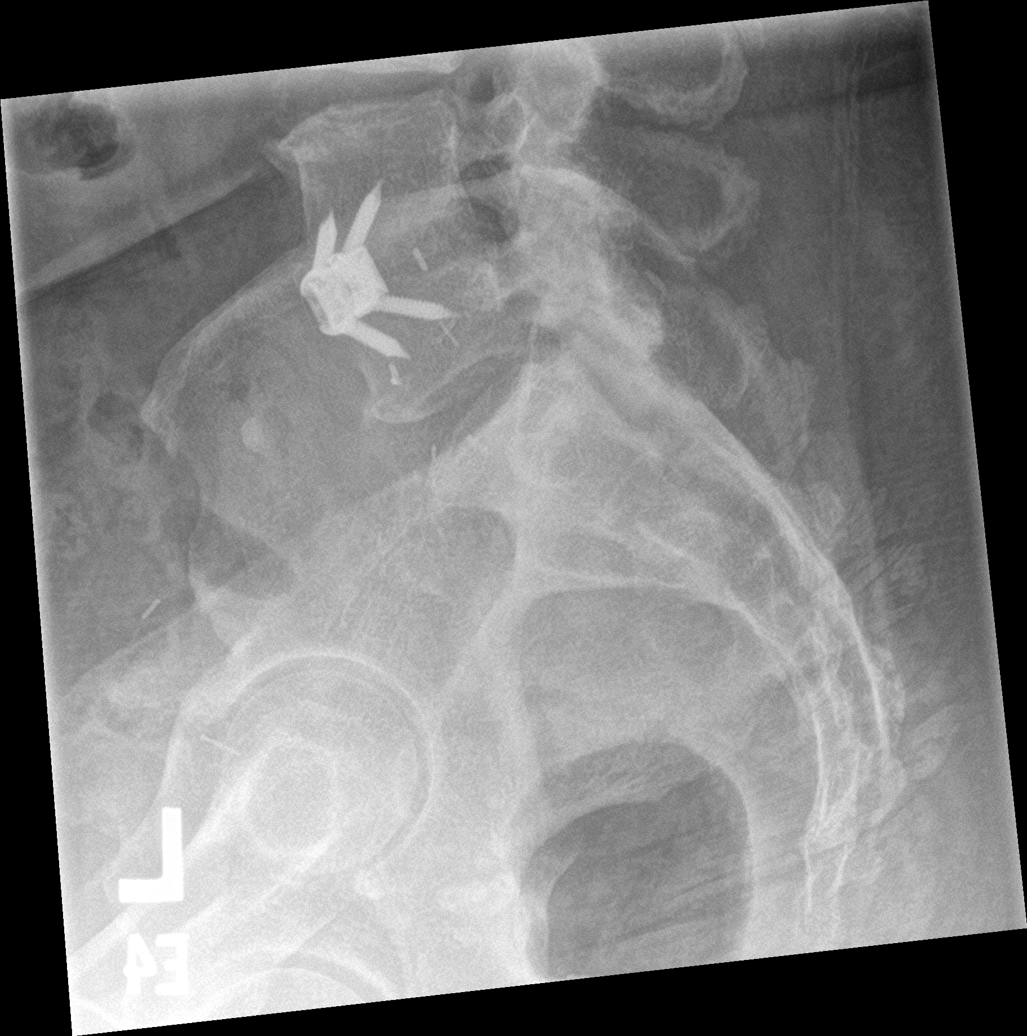

[l-spine lat]
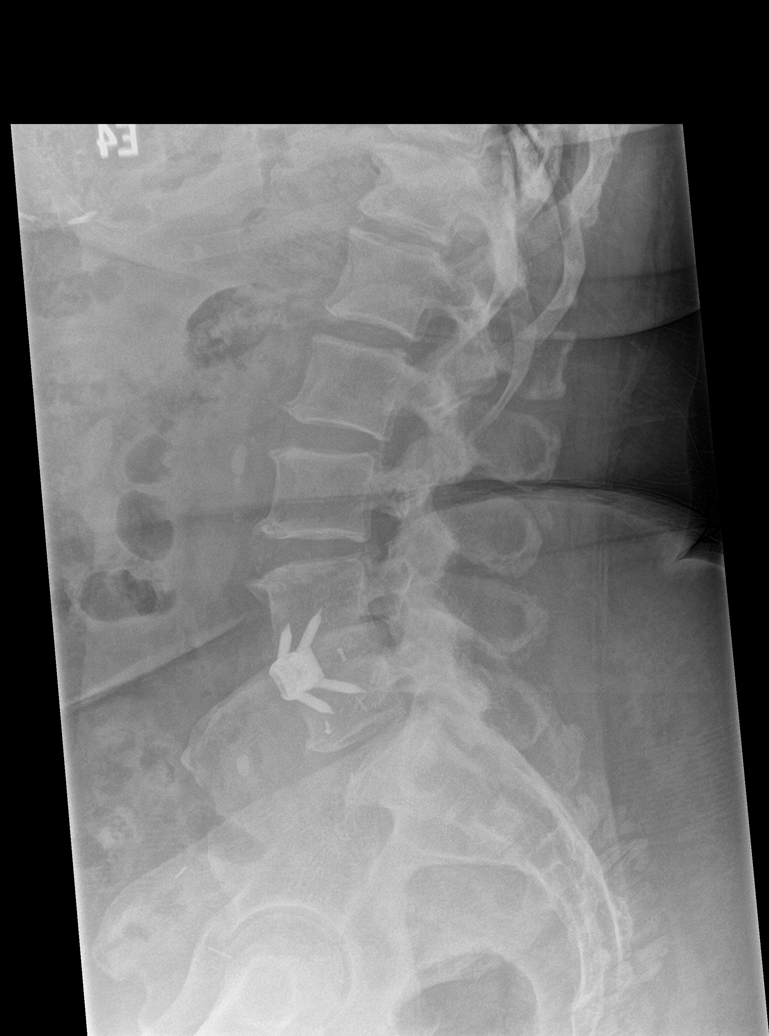

[5 of 5 positions shown; findings below may reference images not displayed]

FINDINGS: Prior anterior fusion at C4-5. Diffuse degenerative facet disease,
most pronounced at L4-5 and L5-S1. Normal alignment. No fracture. SI
joints symmetric and unremarkable.
IMPRESSION: Postoperative and degenerative changes.  No acute bony abnormality.

## 2023-02-12 ENCOUNTER — Ambulatory Visit: Payer: 59 | Admitting: Occupational Therapy

## 2023-02-12 DIAGNOSIS — H43823 Vitreomacular adhesion, bilateral: Secondary | ICD-10-CM | POA: Diagnosis not present

## 2023-02-12 DIAGNOSIS — H35061 Retinal vasculitis, right eye: Secondary | ICD-10-CM | POA: Diagnosis not present

## 2023-02-12 DIAGNOSIS — E119 Type 2 diabetes mellitus without complications: Secondary | ICD-10-CM | POA: Diagnosis not present

## 2023-02-12 LAB — HM DIABETES EYE EXAM

## 2023-02-13 ENCOUNTER — Encounter: Payer: Self-pay | Admitting: Family Medicine

## 2023-02-19 ENCOUNTER — Inpatient Hospital Stay: Payer: 59

## 2023-02-19 ENCOUNTER — Inpatient Hospital Stay: Payer: 59 | Attending: Oncology | Admitting: Oncology

## 2023-02-19 VITALS — BP 142/86 | HR 91 | Temp 98.1°F | Resp 18 | Ht 66.0 in | Wt 285.6 lb

## 2023-02-19 DIAGNOSIS — L819 Disorder of pigmentation, unspecified: Secondary | ICD-10-CM | POA: Insufficient documentation

## 2023-02-19 DIAGNOSIS — Z7901 Long term (current) use of anticoagulants: Secondary | ICD-10-CM | POA: Diagnosis not present

## 2023-02-19 DIAGNOSIS — Z86718 Personal history of other venous thrombosis and embolism: Secondary | ICD-10-CM | POA: Insufficient documentation

## 2023-02-19 DIAGNOSIS — I82403 Acute embolism and thrombosis of unspecified deep veins of lower extremity, bilateral: Secondary | ICD-10-CM

## 2023-02-19 DIAGNOSIS — D649 Anemia, unspecified: Secondary | ICD-10-CM

## 2023-02-19 LAB — PROTIME-INR
INR: 2.6 — ABNORMAL HIGH (ref 0.8–1.2)
Prothrombin Time: 28.4 s — ABNORMAL HIGH (ref 11.4–15.2)

## 2023-02-19 NOTE — Progress Notes (Signed)
[facility] OFFICE PROGRESS NOTE   Diagnosis: Chronic anticoagulation  INTERVAL HISTORY:   Ms. Justiss returns as scheduled.  She continues Coumadin.  She complains of a pruritic lesion at the left lower abdominal wall.  This has been present for the past year. She was admitted in June with severe anemia and Hemoccult positive stool.  She was transfused with packed red blood cells.  The PT/INR supratherapeutic.  An upper endoscopy 08/25/2022 gastric polyp.  The polyp was removed and clips were placed to stop active bleeding..  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  This was treated with APC and 2 clips.  A bleeding gastric body polyp was removed.  This was treated with APC and 2 clips.  Ms. Bradburn reports dark stool prior to the hospital admission in June.   Objective:  Vital signs in last 24 hours:  Blood pressure (!) 142/86, pulse 91, temperature 98.1 F (36.7 C), temperature source Temporal, resp. rate 18, height 5\' 6"  (1.676 m), weight 285 lb 9.6 oz (129.5 kg), SpO2 96%.    Resp: Lungs with coarse rhonchi at the posterior base bilaterally, no respiratory distress Cardio: Regular rate and rhythm GI: Nontender, no hepatosplenomegaly Vascular: The left lower leg is larger than the right side, no edema  Skin: Hyperpigmented plaque at the left lower abdominal wall.  There is an area of skin breakdown near the medial aspect of the plaque with firm granulation tissue   Lab Results:  Lab Results  Component Value Date   WBC 5.8 12/25/2022   HGB 11.8 (L) 12/25/2022   HCT 37.6 12/25/2022   MCV 80.2 12/25/2022   PLT 343.0 12/25/2022   NEUTROABS 3.3 12/25/2022    CMP  Lab Results  Component Value Date   NA 136 09/11/2022   K 3.8 09/11/2022   CL 102 09/11/2022   CO2 28 09/11/2022   GLUCOSE 105 (H) 09/11/2022   BUN 11 09/11/2022   CREATININE 0.76 09/11/2022   CALCIUM 8.9 09/11/2022   PROT 7.7 09/11/2022   ALBUMIN 3.9 09/11/2022   AST 25  09/11/2022   ALT 14 01/31/2023   ALKPHOS 76 09/11/2022   BILITOT 0.3 09/11/2022   GFRNONAA >60 09/02/2022   GFRAA 94 03/05/2020    Medications: I have reviewed the patient's current medications.   Assessment/Plan: History of bilateral lower extremity deep vein thrombosis. She continues Coumadin anticoagulation. History of a positive lupus anticoagulant. History of a positive beta-2-glycoprotein IgA antibody. Chronic bilateral leg pain - negative bilateral Doppler January 2010. History of hypertension. COPD Status post hysterectomy. Remote history of cocaine abuse. History of an adrenal lesion on a CT scan in 2008 - status post a repeat CT on June 16, 2009, showing slow enlargement of the right adrenal nodule since 2006, most consistent with an adenoma. Stable on a CT 04/24/2014 History of rectal and vaginal bleeding in May 2009.   Status post carpal tunnel surgery. 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 Right knee replacement September 2011. Multiple orthopedic conditions. Placement of IVC filter preoperatively before knee replacement. The IVC filter was retrieved 03/01/2011. Anemia. Ferritin in normal range at 82 on 08/18/2013; 61 on 07/10/2016 Status post lumbar fusion surgery October 2013 Right hydronephrosis-evaluated by Dr. Berneice Heinrich Left ankle swelling/pain 07/24/2016 Mild anemia noted on hospital admission 2018 Colonoscopy 04/19/2017-entire examined colon normal.  Repeat colonoscopy in 10 years for surveillance. 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 Hout appears stable.  She was admitted last summer with GI bleeding.  She was treated endoscopically for a bleeding angioectasia and gastric polyps.  She continues Coumadin anticoagulation.   The PT/INR is therapeutic today.  She will return for a CBC and PT/INR in 1 month.  The etiology of the hyperpigmented plaque with superficial skin breakdown is unclear.  She plans to follow-up with Dr. Salomon Fick to evaluate this area. Ms. Alcala will be scheduled for an office visit in 6 months.  Thornton Papas, MD  02/19/2023  11:21 AM

## 2023-02-20 ENCOUNTER — Ambulatory Visit: Payer: 59 | Admitting: Occupational Therapy

## 2023-02-21 ENCOUNTER — Other Ambulatory Visit (INDEPENDENT_AMBULATORY_CARE_PROVIDER_SITE_OTHER): Payer: 59

## 2023-02-21 DIAGNOSIS — E1140 Type 2 diabetes mellitus with diabetic neuropathy, unspecified: Secondary | ICD-10-CM

## 2023-02-21 MED ORDER — ZONISAMIDE 100 MG PO CAPS: 100.0000 mg | ORAL_CAPSULE | ORAL | 2 refills | Status: DC

## 2023-02-21 NOTE — Progress Notes (Signed)
   02/21/2023  Patient ID: [name], female   DOB: June 21, 1955, 67 y.o.   MRN: [id number]  Patient presented in office for medication review and to review blood sugars.  Patient did not remember to bring her BG meter with her today and unable to report readings. Top concern today is being out of zonisamide and requesting a referral to a new neurologist (having issues getting to [place] to see [place] current one).  Sending in 3 months of zonisamide with PCP approval while patient awaits referral. Patient requested follow up with PCP and will be due for new A1C 03/05/23. Follow up scheduled.  Follow Up Plan: pending PCP visit 03/05/23

## 2023-02-25 ENCOUNTER — Other Ambulatory Visit: Payer: Self-pay | Admitting: Family Medicine

## 2023-02-25 DIAGNOSIS — K5900 Constipation, unspecified: Secondary | ICD-10-CM

## 2023-02-26 IMAGING — MR MR LUMBAR SPINE WO/W CM
4 of 7 series · 25 of 48 positions shown · IV contrast (20 ML MULTIHANCE)
Comparison: Lumbar spine radiographs 08/18/2020. MRI 04/16/2019.
Abdominal CT 04/25/2014.

CLINICAL DATA: Right-sided low back pain for 1 month. History of
back surgery.

EXAM:
MRI LUMBAR SPINE WITHOUT AND WITH CONTRAST
TECHNIQUE: Multiplanar and multiecho pulse sequences of the lumbar spine were
obtained without and with intravenous contrast.
CONTRAST:  20mL MULTIHANCE GADOBENATE DIMEGLUMINE 529 MG/ML IV SOLN

[Series 4: T1 · sagittal · 4.0mm · 0.53mm/px · 5 of 15 slices shown (1 of 2)]
[im 1/15]
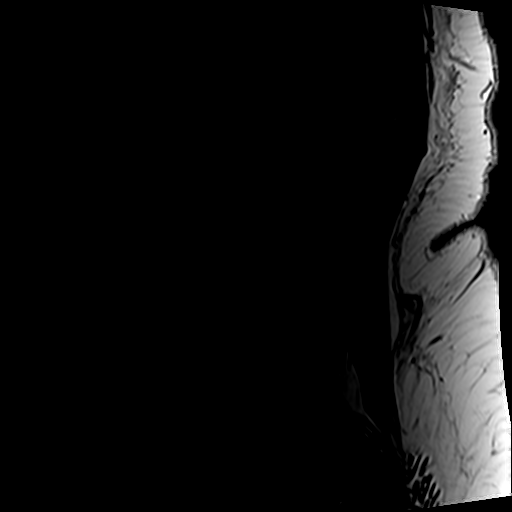
[im 4/15]
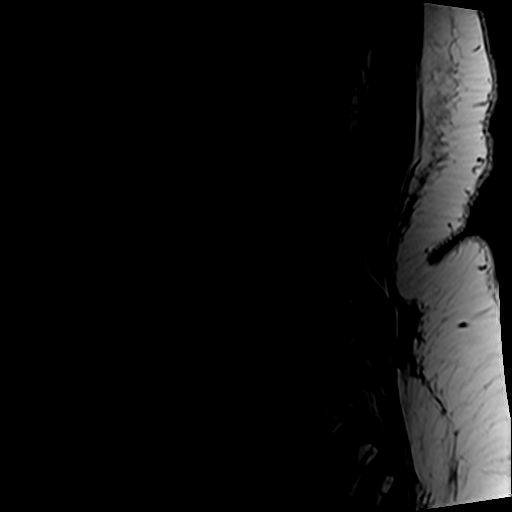
[im 8/15]
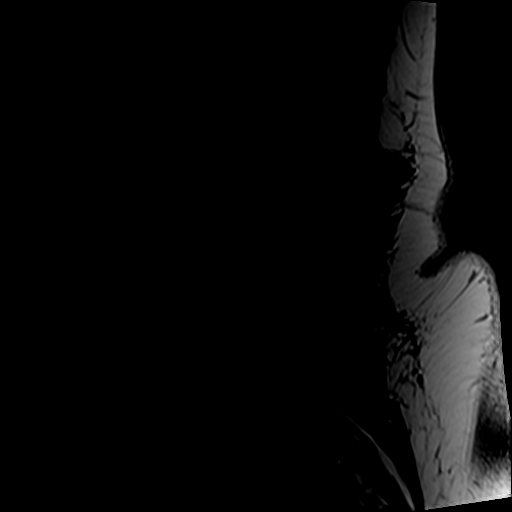
[im 11/15]
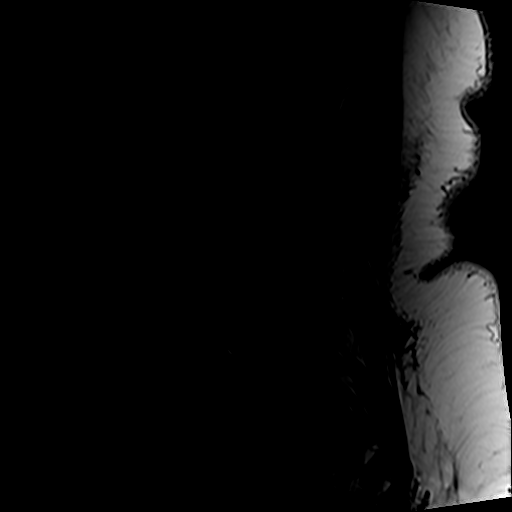
[im 15/15]
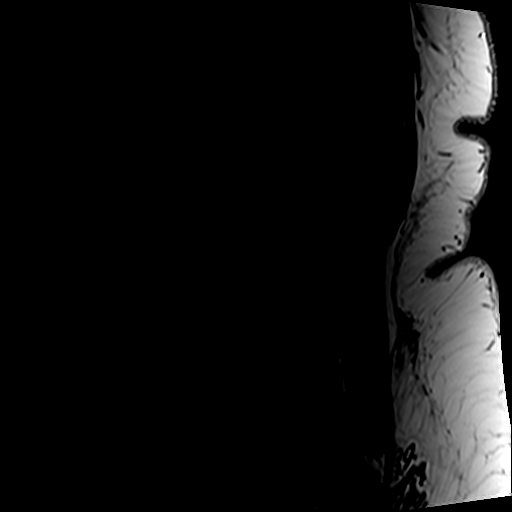

[Series 5: T2 · axial · 4.0mm · 0.70mm/px · z∈[-86,+116]mm · 8 of 35 slices shown (1 of 2)]
[im 1/35]
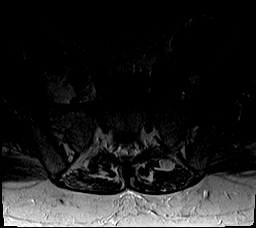
[im 4/35]
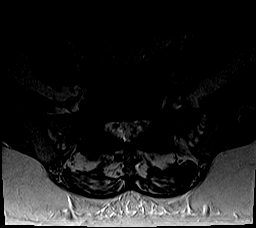
[im 12/35]
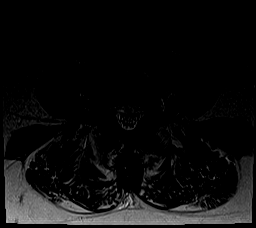
[im 16/35]
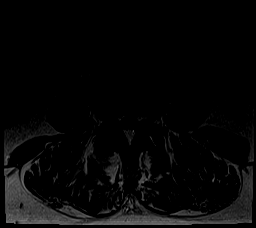
[im 19/35]
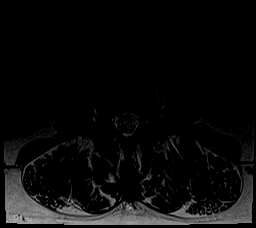
[im 23/35]
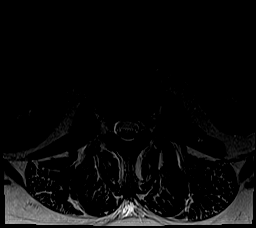
[im 31/35]
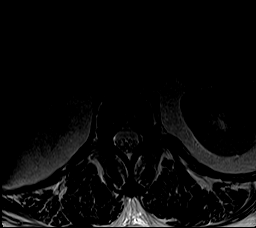
[im 35/35]
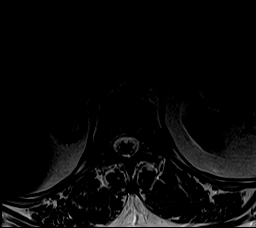

[Series 6: T1 · axial · 4.0mm · 0.35mm/px · z∈[-86,+116]mm · 8 of 35 slices shown (2 of 2)]
[im 1/35]
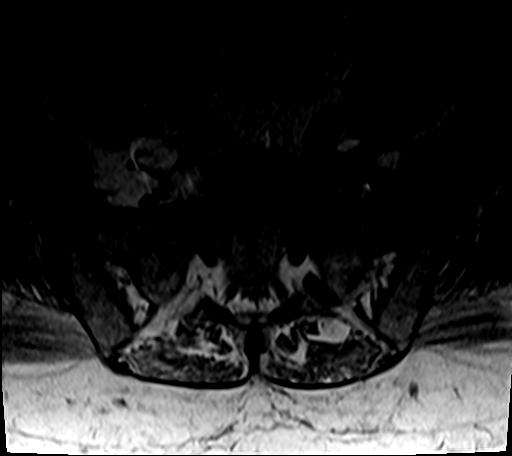
[im 4/35]
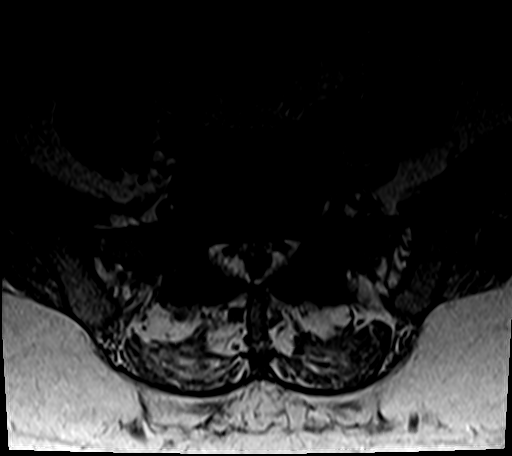
[im 12/35]
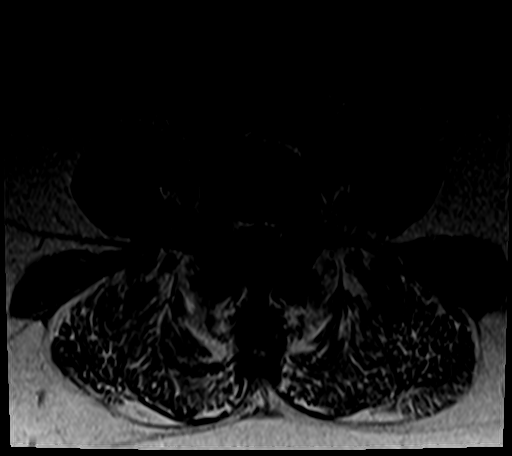
[im 16/35]
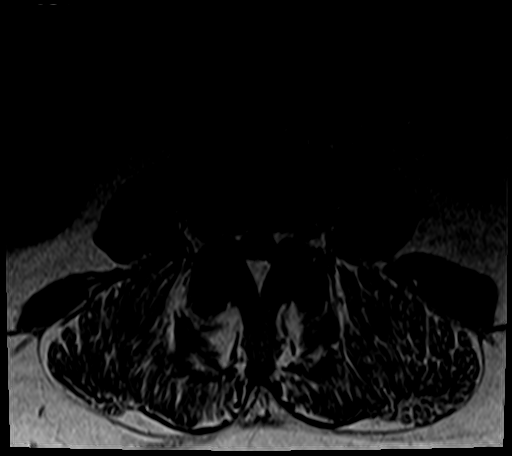
[im 19/35]
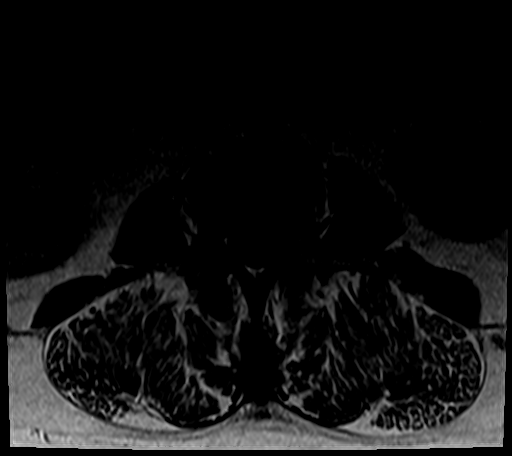
[im 23/35]
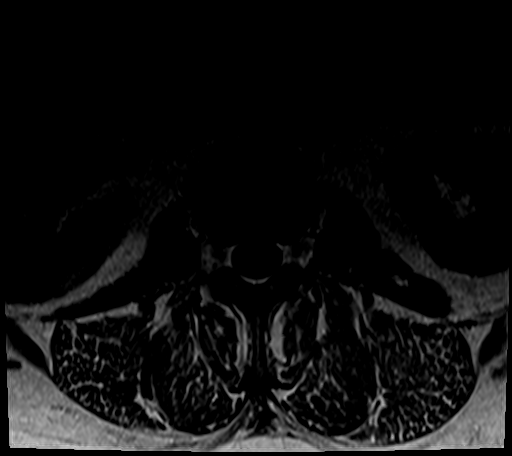
[im 31/35]
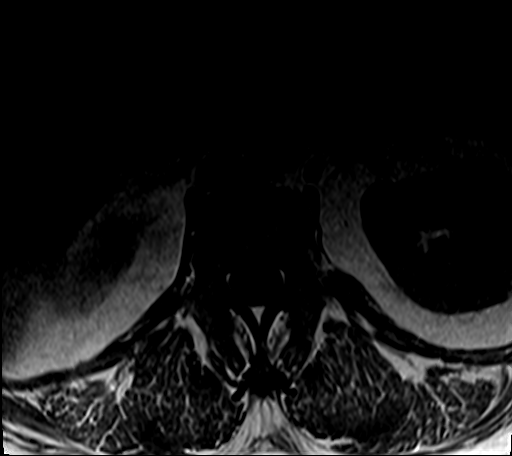
[im 35/35]
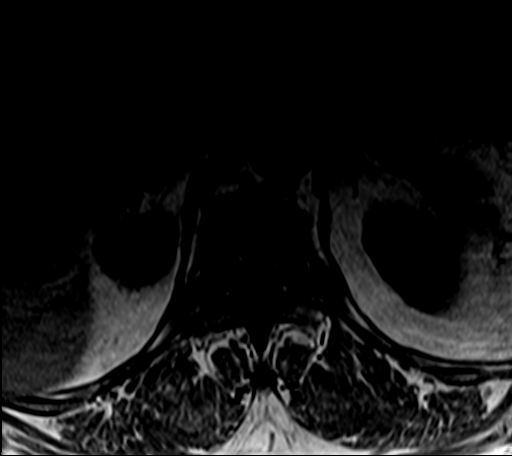

[Series 7: T2 · sagittal · 4.0mm · 0.53mm/px · 4 of 15 slices shown (2 of 2)]
[im 1/15]
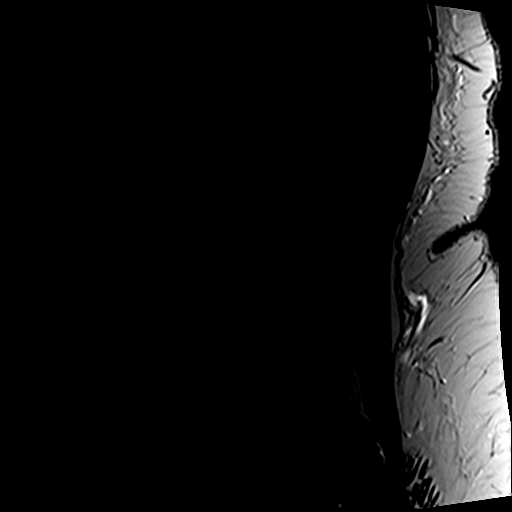
[im 5/15]
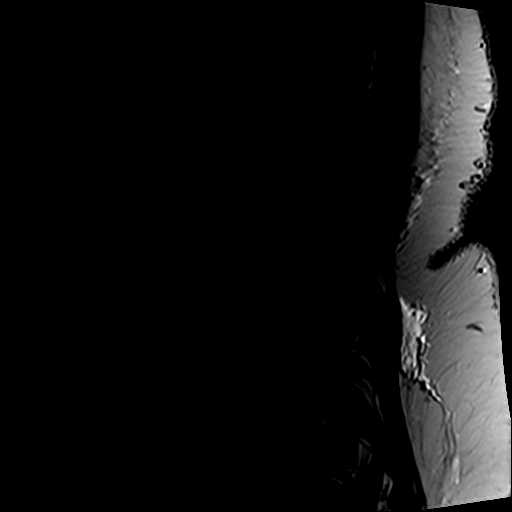
[im 10/15]
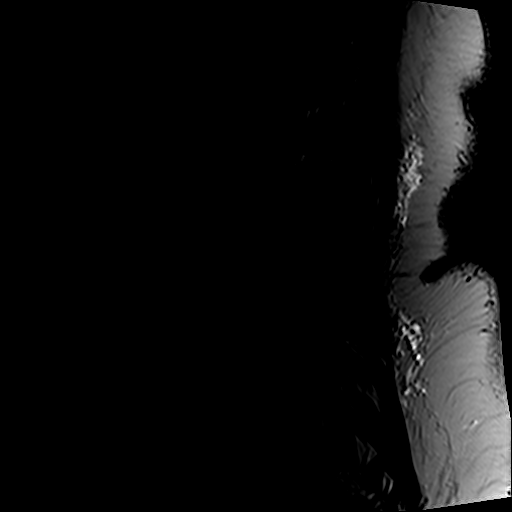
[im 15/15]
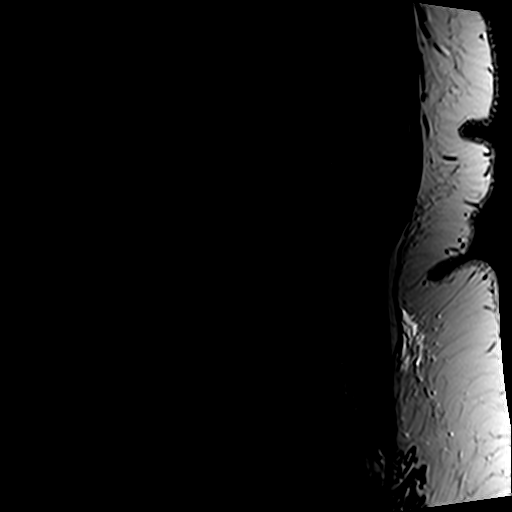

[25 of 48 positions shown; findings below may reference images not displayed]

FINDINGS: Segmentation: Conventional anatomy assumed, with the last open disc
space designated L5-S1.Concordant with previous imaging.

Alignment:  Stable and near anatomic.

Vertebrae: No worrisome osseous lesion, acute fracture or pars
defect. Status post anterior lumbar and interbody fusion at L4-5
with solid interbody fusion. The visualized sacroiliac joints appear
unremarkable.

Conus medullaris: Extends to the L1 level and appears normal. No
abnormal intradural enhancement.

Paraspinal and other soft tissues: No significant paraspinal
findings. Chronic enhancing right adrenal nodule measuring 3.2 cm on
image [DATE], present on 8339 CT and most consistent with a lipid poor
adenoma.

Disc levels:

Sagittal images demonstrate disc bulging and anterior osteophyte
formation at T10-11 and T11-12, grossly stable from previous study,
although incompletely imaged before.

No significant disc space findings from T12-L1 through L1-2.

L2-3: Mild disc bulging and facet hypertrophy. No spinal stenosis or
nerve root encroachment.

L3-4: Loss of disc height with annular disc bulging eccentric to the
right. Mild to moderate facet and ligamentous hypertrophy. Stable
mild spinal stenosis and mild right foraminal narrowing.

L4-5: Stable postsurgical changes from previous anterior discectomy
and fusion. The spinal canal and neural foramina rema[REDACTED]ompressed.

L5-S1: Stable annular disc bulging eccentric to the right and
moderate bilateral facet hypertrophy. Stable chronic
right-greater-than-left foraminal narrowing.
IMPRESSION: 1. Stable MRI of the lumbar spine compared with previous study from
17 months prior.
2. Chronic facet hypertrophy and right-greater-than-left foraminal
narrowing at L5-S1 which may affect the exiting right L5 nerve root.
3. Previous fusion at L4-5 with patent spinal canal and neural
foramina.
4. Stable mild multifactorial spinal stenosis and mild right
foraminal narrowing at L3-4.
5. Stable right adrenal nodule most consistent with an adenoma based
on stability from previous CTs.

## 2023-02-26 NOTE — Therapy (Signed)
OUTPATIENT OCCUPATIONAL THERAPY ORTHO EVALUATION  Patient Name: [name] MRN: [id number] DOB:[birth year], 67 y.o., female Today's Date: 02/26/2023  PCP: *** REFERRING PROVIDER: Samuella Cota, MD  END OF SESSION:   Past Medical History:  Diagnosis Date   Adrenal mass (HCC) 03/226   Benign   Arthritis    knees/multiple orthopedic conditons; lower back   Asthma    per pt   Clotting disorder (HCC)    +beta-2-glycoprotein IgA antibody   COPD (chronic obstructive pulmonary disease) (HCC)    inhalers dependent on environment   Depression    Diabetes mellitus    120s usually fasting -  dx more than 10 yrs ago   Dizziness, nonspecific    DVT (deep venous thrombosis) (HCC)    Recurrent   Fibromyalgia    GERD (gastroesophageal reflux disease)    Heart murmur    History of blood transfusion    History of cocaine abuse (HCC)    Remote history    Hyperlipidemia    Hypertension    takes meds daily   Hypothyroidism    Lupus Anticoagulant Positive    On home oxygen therapy    at night   Shortness of breath    exertion or lying flat   Sleep apnea    2l of oxygen at night (as of 12/6, she used to)   Past Surgical History:  Procedure Laterality Date   ABDOMINAL HYSTERECTOMY  1989   Fibroids   ANTERIOR LUMBAR FUSION  01/03/2012   Procedure: ANTERIOR LUMBAR FUSION 1 LEVEL;  Surgeon: Carmela Hurt, MD;  Location: [facility];  Service: Neurosurgery;  Laterality: N/A;  Lumbar Four-Five Anterior Lumbar Interbody Fusion with Instrumentation   BACK SURGERY     cervical spine---disk disease   CARPAL TUNNEL RELEASE     Bilateral   CESAREAN SECTION     CHOLECYSTECTOMY     CYSTOSCOPY/RETROGRADE/URETEROSCOPY/STONE EXTRACTION WITH BASKET Right 04/26/2014   Procedure: CYSTOSCOPY/ RIGHT RETROGRADE/ RIGHT URETEROSCOPY/URETERAL AND RENAL PELVIS BIOPSY;  Surgeon: Sebastian Ache, MD;  Location: [facility];  Service: Urology;  Laterality: Right;   ESOPHAGOGASTRODUODENOSCOPY (EGD) WITH  PROPOFOL N/A 08/25/2022   Procedure: ESOPHAGOGASTRODUODENOSCOPY (EGD) WITH PROPOFOL;  Surgeon: Jeani Hawking, MD;  Location: [facility];  Service: Gastroenterology;  Laterality: N/A;   gallstones removed     HEMOSTASIS CLIP PLACEMENT  08/25/2022   Procedure: HEMOSTASIS CLIP PLACEMENT;  Surgeon: Jeani Hawking, MD;  Location: [facility];  Service: Gastroenterology;;   HOT HEMOSTASIS N/A 08/25/2022   Procedure: HOT HEMOSTASIS (ARGON PLASMA COAGULATION/BICAP);  Surgeon: Jeani Hawking, MD;  Location: [facility];  Service: Gastroenterology;  Laterality: N/A;   POLYPECTOMY  08/25/2022   Procedure: POLYPECTOMY;  Surgeon: Jeani Hawking, MD;  Location: [facility];  Service: Gastroenterology;;   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   bilateral   right elbow surgery     RIGHT HEART CATH N/A 02/28/2021   Procedure: RIGHT HEART CATH;  Surgeon: Kathleene Hazel, MD;  Location: [facility] INVASIVE CV LAB;  Service: Cardiovascular;  Laterality: N/A;   THYROID LOBECTOMY Right 02/27/2017   THYROID LOBECTOMY Right 02/27/2017   Procedure: RIGHT THYROID LOBECTOMY;  Surgeon: Harriette Bouillon, MD;  Location: [facility];  Service: General;  Laterality: Right;   TUBAL LIGATION     Patient Active Problem List   Diagnosis Date Noted   Generalized anxiety disorder 10/19/2022   Major depressive disorder, recurrent, moderate (HCC) 10/19/2022   Normocytic anemia 08/23/2022   Left knee pain 08/23/2022   Mixed hyperlipidemia 10/07/2021  Paroxysmal SVT (supraventricular tachycardia) (HCC) 10/07/2021   Allergic rhinitis 11/29/2020   Allergic rhinitis due to pollen 11/29/2020   Moderate persistent asthma without complication 11/29/2020   Osteoarthritis of spine with radiculopathy, lumbar region 08/24/2020   Chronic back pain 08/18/2020   Obesity, diabetes, and hypertension syndrome (HCC) 02/27/2020   Vitreomacular adhesion of both eyes 02/09/2020   Pseudophakia of both eyes 02/09/2020   Chronic diastolic CHF (congestive heart  failure) (HCC) 03/12/2019   Hypernatremia 03/09/2019   Suspected COVID-19 virus infection 03/09/2019   Thyroid mass 02/27/2017   Chronic pain of right knee 05/24/2016   Effusion, right knee 04/28/2016   Presence of right artificial knee joint 04/28/2016   Abnormal LFTs    Decreased sensation    Paresthesia 06/12/2014   Obstruction of kidney 04/24/2014   Chronic anticoagulation 04/24/2014   Crack cocaine use 04/24/2014   Depression 04/24/2014   Fibromyalgia 04/24/2014   Obstructive sleep apnea on CPAP 04/24/2014   Lupus anticoagulant positive 04/24/2014   Adrenal mass, right (HCC) 04/24/2014   Right kidney mass 04/24/2014   Hydronephrosis of right kidney 04/24/2014   Supratherapeutic INR 04/24/2014   Renal mass 04/24/2014   Chronic obstructive pulmonary disease (HCC) 04/24/2014   Diabetes mellitus with coincident hypertension (HCC)    Right lower quadrant abdominal pain    Morbid obesity (HCC) 08/21/2013   Constipation 08/19/2013   Lumbosacral spondylosis without myelopathy 11/19/2012   Morbid obesity with BMI of 45.0-49.9, adult (HCC) 01/05/2012   Bilateral deep vein thromboses (HCC) 12/13/2011   Hypokalemia 11/05/2011   Sciatica 11/05/2011   Hypertension 11/05/2011   Diabetes type 2, controlled (HCC) 11/05/2011    ONSET DATE: 01/31/2023  REFERRING DIAG: W09.811 (ICD-10-CM) - Trigger finger, left middle finger Note:  S/P Left long finger trigger release Referral placed occupational therapy for range of motion exercises with transition to strengthening as appropriate. Can transition to home exercise program as well.  THERAPY DIAG:  No diagnosis found.  Rationale for Evaluation and Treatment: Rehabilitation  SUBJECTIVE:   SUBJECTIVE STATEMENT: *** Pt accompanied by: {accompnied:27141}  PERTINENT HISTORY: ***  PRECAUTIONS: {Therapy precautions:24002}  RED FLAGS: {PT Red Flags:29287}   WEIGHT BEARING RESTRICTIONS: {Yes ***/No:24003}  PAIN:  Are you having  pain? {OPRCPAIN:27236}  FALLS: Has patient fallen in last 6 months? {fallsyesno:27318}  LIVING ENVIRONMENT: Lives with: {OPRC lives with:25569::"lives with their family"} Lives in: {Lives in:25570} Stairs: {opstairs:27293} Has following equipment at home: {Assistive devices:23999}  PLOF: {PLOF:24004}  PATIENT GOALS: ***  NEXT MD VISIT: ***  OBJECTIVE:  Note: Objective measures were completed at Evaluation unless otherwise noted.  HAND DOMINANCE: {MISC; OT HAND [phone]}  ADLs: {ADLs OT:31716}  FUNCTIONAL OUTCOME MEASURES: {OTFUNCTIONALMEASURES:27238}  UPPER EXTREMITY ROM:     {AROM/PROM:27142} ROM Right eval Left eval  Shoulder flexion    Shoulder abduction    Shoulder adduction    Shoulder extension    Shoulder internal rotation    Shoulder external rotation    Elbow flexion    Elbow extension    Wrist flexion    Wrist extension    Wrist ulnar deviation    Wrist radial deviation    Wrist pronation    Wrist supination    (Blank rows = not tested)  {AROM/PROM:27142} ROM Right eval Left eval  Thumb MCP (0-60)    Thumb IP (0-80)    Thumb Radial abd/add (0-55)     Thumb Palmar abd/add (0-45)     Thumb Opposition to Small Finger     Index MCP (  0-90)     Index PIP (0-100)     Index DIP (0-70)      Long MCP (0-90)      Long PIP (0-100)      Long DIP (0-70)      Ring MCP (0-90)      Ring PIP (0-100)      Ring DIP (0-70)      Little MCP (0-90)      Little PIP (0-100)      Little DIP (0-70)      (Blank rows = not tested)   UPPER EXTREMITY MMT:     MMT Right eval Left eval  Shoulder flexion    Shoulder abduction    Shoulder adduction    Shoulder extension    Shoulder internal rotation    Shoulder external rotation    Middle trapezius    Lower trapezius    Elbow flexion    Elbow extension    Wrist flexion    Wrist extension    Wrist ulnar deviation    Wrist radial deviation    Wrist pronation    Wrist supination    (Blank  rows = not tested)  HAND FUNCTION: {handfunction:27230}  COORDINATION: {otcoordination:27237}  SENSATION: {sensation:27233}  EDEMA: ***  COGNITION: Overall cognitive status: {cognition:24006} Areas of impairment: {impairedcognition:27234}  OBSERVATIONS: ***   TODAY'S TREATMENT:                                                                                                                              DATE: ***    PATIENT EDUCATION: Education details: *** Person educated: {Person educated:25204} Education method: {Education Method:25205} Education comprehension: {Education Comprehension:25206}  HOME EXERCISE PROGRAM: ***  GOALS: Goals reviewed with patient? {yes/no:20286}  SHORT TERM GOALS: Target date: ***  *** Baseline: Mcartor status: INITIAL  2.  *** Baseline:  Tadlock status: INITIAL  3.  *** Baseline:  Drab status: INITIAL  4.  *** Baseline:  [place] status: INITIAL  5.  *** Baseline:  Glab status: INITIAL  6.  *** Baseline:  Webb status: INITIAL  LONG TERM GOALS: Target date: ***  *** Baseline:  Rimel status: INITIAL  2.  *** Baseline:  Borthwick status: INITIAL  3.  *** Baseline:  Sotomayor status: INITIAL  4.  *** Baseline:  Yarberry status: INITIAL  5.  *** Baseline:  Mcwatters status: INITIAL  6.  *** Baseline:  Fong status: INITIAL  ASSESSMENT:  CLINICAL IMPRESSION: Patient is a *** y.o. *** who was seen today for occupational therapy evaluation for ***.   PERFORMANCE DEFICITS: in functional skills including {OT physical skills:25468}, cognitive skills including {OT cognitive skills:25469}, and psychosocial skills including {OT psychosocial skills:25470}.   IMPAIRMENTS: are limiting patient from {OT performance deficits:25471}.   COMORBIDITIES: {Comorbidities:25485} that affects occupational performance. Patient will benefit from skilled OT to address above impairments and improve overall function.  MODIFICATION OR ASSISTANCE TO  COMPLETE EVALUATION: {OT modification:25474}  OT OCCUPATIONAL PROFILE AND  HISTORY: {OT PROFILE AND HISTORY:25484}  CLINICAL DECISION MAKING: {OT CDM:25475}  REHAB POTENTIAL: {rehabpotential:25112}  EVALUATION COMPLEXITY: {Evaluation complexity:25115}      PLAN:  OT FREQUENCY: {rehab frequency:25116}  OT DURATION: {rehab duration:25117}  PLANNED INTERVENTIONS: {OT Interventions:25467}  RECOMMENDED OTHER SERVICES: ***  CONSULTED AND AGREED WITH PLAN OF CARE: {WUJ:81191}  PLAN FOR NEXT SESSION: ***   Sheran Lawless, OT 02/26/2023, 12:32 PM

## 2023-02-27 ENCOUNTER — Ambulatory Visit: Payer: 59 | Attending: Orthopedic Surgery | Admitting: Occupational Therapy

## 2023-02-27 DIAGNOSIS — M25642 Stiffness of left hand, not elsewhere classified: Secondary | ICD-10-CM

## 2023-02-27 DIAGNOSIS — R278 Other lack of coordination: Secondary | ICD-10-CM

## 2023-02-27 DIAGNOSIS — M65332 Trigger finger, left middle finger: Secondary | ICD-10-CM | POA: Insufficient documentation

## 2023-02-27 DIAGNOSIS — M6281 Muscle weakness (generalized): Secondary | ICD-10-CM | POA: Diagnosis not present

## 2023-02-27 DIAGNOSIS — M79642 Pain in left hand: Secondary | ICD-10-CM

## 2023-02-28 ENCOUNTER — Ambulatory Visit: Payer: 59 | Admitting: Podiatry

## 2023-03-02 ENCOUNTER — Encounter: Payer: 59 | Admitting: Orthopedic Surgery

## 2023-03-02 ENCOUNTER — Ambulatory Visit (INDEPENDENT_AMBULATORY_CARE_PROVIDER_SITE_OTHER): Payer: 59 | Admitting: Orthopedic Surgery

## 2023-03-02 DIAGNOSIS — Z9889 Other specified postprocedural states: Secondary | ICD-10-CM

## 2023-03-02 NOTE — Progress Notes (Unsigned)
   [name] - 66 y.o. female MRN [id number]  Date of birth: 1955/09/15  Office Visit Note: Visit Date: 03/02/2023 PCP: [name], MD Referred by: [name], MD  Subjective:  HPI: [name] is a 67 y.o. female who presents today for follow up 6 weeks status post left long trigger release.  Pertinent ROS were reviewed with the patient and found to be negative unless otherwise specified above in HPI.   Assessment & Plan: Visit Diagnoses: No diagnosis found.  Plan: ***  Follow-up: No follow-ups on file.   Meds & Orders: No orders of the defined types were placed in this encounter.  No orders of the defined types were placed in this encounter.    Procedures: No procedures performed       Objective:   Vital Signs: There were no vitals taken for this visit.  Ortho Exam ***  Imaging: No results found.   [name], M.D. [facility] 11:28 AM

## 2023-03-05 ENCOUNTER — Encounter: Payer: Self-pay | Admitting: Family Medicine

## 2023-03-05 ENCOUNTER — Ambulatory Visit (INDEPENDENT_AMBULATORY_CARE_PROVIDER_SITE_OTHER): Payer: 59 | Admitting: Family Medicine

## 2023-03-05 VITALS — BP 160/80 | HR 86 | Temp 98.5°F | Ht 66.0 in | Wt 283.0 lb

## 2023-03-05 DIAGNOSIS — R0982 Postnasal drip: Secondary | ICD-10-CM

## 2023-03-05 DIAGNOSIS — M47817 Spondylosis without myelopathy or radiculopathy, lumbosacral region: Secondary | ICD-10-CM

## 2023-03-05 DIAGNOSIS — R61 Generalized hyperhidrosis: Secondary | ICD-10-CM

## 2023-03-05 DIAGNOSIS — H6122 Impacted cerumen, left ear: Secondary | ICD-10-CM

## 2023-03-05 DIAGNOSIS — Z6841 Body Mass Index (BMI) 40.0 and over, adult: Secondary | ICD-10-CM

## 2023-03-05 DIAGNOSIS — K5904 Chronic idiopathic constipation: Secondary | ICD-10-CM | POA: Insufficient documentation

## 2023-03-05 DIAGNOSIS — I10 Essential (primary) hypertension: Secondary | ICD-10-CM

## 2023-03-05 DIAGNOSIS — R42 Dizziness and giddiness: Secondary | ICD-10-CM | POA: Diagnosis not present

## 2023-03-05 DIAGNOSIS — K219 Gastro-esophageal reflux disease without esophagitis: Secondary | ICD-10-CM | POA: Insufficient documentation

## 2023-03-05 DIAGNOSIS — D131 Benign neoplasm of stomach: Secondary | ICD-10-CM | POA: Insufficient documentation

## 2023-03-05 DIAGNOSIS — R32 Unspecified urinary incontinence: Secondary | ICD-10-CM

## 2023-03-05 DIAGNOSIS — S31109A Unspecified open wound of abdominal wall, unspecified quadrant without penetration into peritoneal cavity, initial encounter: Secondary | ICD-10-CM | POA: Diagnosis not present

## 2023-03-05 DIAGNOSIS — D509 Iron deficiency anemia, unspecified: Secondary | ICD-10-CM | POA: Insufficient documentation

## 2023-03-05 DIAGNOSIS — Z7985 Long-term (current) use of injectable non-insulin antidiabetic drugs: Secondary | ICD-10-CM

## 2023-03-05 DIAGNOSIS — E119 Type 2 diabetes mellitus without complications: Secondary | ICD-10-CM | POA: Insufficient documentation

## 2023-03-05 DIAGNOSIS — Z1211 Encounter for screening for malignant neoplasm of colon: Secondary | ICD-10-CM | POA: Insufficient documentation

## 2023-03-05 DIAGNOSIS — E1140 Type 2 diabetes mellitus with diabetic neuropathy, unspecified: Secondary | ICD-10-CM

## 2023-03-05 MED ORDER — PROCARE BARIATRIC BRIEFS MISC: 11 refills | Status: DC

## 2023-03-05 MED ORDER — LIDOCAINE 5 % EX PTCH: 1.0000 | MEDICATED_PATCH | Freq: Every day | CUTANEOUS | 4 refills | Status: DC | PRN

## 2023-03-05 MED ORDER — FEXOFENADINE HCL 180 MG PO TABS: 180.0000 mg | ORAL_TABLET | Freq: Every day | ORAL | 3 refills | Status: DC

## 2023-03-05 NOTE — Progress Notes (Signed)
Established Patient Office Visit   Subjective  Patient ID: [name], female    DOB: [birth year]  Age: 67 y.o. MRN: [id number]  Chief Complaint  Patient presents with   Wound Check    Ulcer on stomach started 5 months ago,    Back Pain    Patient is a 67 year old female seen for follow-up and ongoing concerns.  Patient endorses abrasion on left lower abdomen increasing in size.  Present times months.  Initially started out small, maybe an inch or less in size.  Patient endorses itching of area.  Using Neosporin.  Denies erythema, drainage, induration.  Patient requesting refills on lidocaine patches and Allegra.  Patient states lidocaine patch along with tramadol help make low back pain manageable.  Patient taking Allegra for allergies.  States pills from recent refill from pharmacy are large clearish yellow gel capsules that she is not used to.  Patient has not taken the medication since it looks so different.  Patient endorses continued episodes of dizziness where she "feels like she is drunk" when walking.  Patient also has sensation waking up first thing in the morning.  Patient also notes episodes of increased sweating.  Typically occur with walking/activity.  Patient states she is fine but other people notice how bad she is sweating.  Pt afraid the sweating will then lead to a presyncopal episode which has happened in the past.  Patient has not been driving much due to the possibility symptoms may occur.  Patient did not take BP meds this morning prior to appointment.  Patient notes using a nail file to clean wax out of ears.  States Q-tips pack wax further into ears.    Patient Active Problem List   Diagnosis Date Noted   Type 2 diabetes mellitus without complications (HCC) 03/05/2023   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   Chronic idiopathic constipation 03/05/2023   Gastro-esophageal reflux disease without esophagitis 03/05/2023   Iron deficiency anemia  03/05/2023   Screening for malignant neoplasm of colon 03/05/2023   Generalized anxiety disorder 10/19/2022   Major depressive disorder, recurrent, moderate (HCC) 10/19/2022   Anemia of chronic disease 08/23/2022   Left knee pain 08/23/2022   Mixed hyperlipidemia 10/07/2021   Paroxysmal SVT (supraventricular tachycardia) (HCC) 10/07/2021   Allergic rhinitis 11/29/2020   Allergic rhinitis due to pollen 11/29/2020   Moderate persistent asthma, uncomplicated 11/29/2020   Osteoarthritis of spine with radiculopathy, lumbar region 08/24/2020   Chronic back pain 08/18/2020   Obesity, diabetes, and hypertension syndrome (HCC) 02/27/2020   Vitreomacular adhesion of both eyes 02/09/2020   Pseudophakia of both eyes 02/09/2020   Chronic diastolic CHF (congestive heart failure) (HCC) 03/12/2019   Hypernatremia 03/09/2019   Suspected COVID-19 virus infection 03/09/2019   Thyroid mass 02/27/2017   Pain in right hip 05/24/2016   Effusion, right knee 04/28/2016   Presence of right artificial knee joint 04/28/2016   Abnormal LFTs    Decreased sensation    Paresthesia 06/12/2014   Obstruction of kidney 04/24/2014   Chronic anticoagulation 04/24/2014   Crack cocaine use 04/24/2014   Depression 04/24/2014   Fibromyalgia 04/24/2014   Obstructive sleep apnea on CPAP 04/24/2014   Lupus anticoagulant positive 04/24/2014   Adrenal mass, right (HCC) 04/24/2014   Right kidney mass 04/24/2014   Hydronephrosis of right kidney 04/24/2014   Supratherapeutic INR 04/24/2014   Renal mass 04/24/2014   Chronic obstructive pulmonary disease (HCC) 04/24/2014   Right lower quadrant abdominal pain    Morbid obesity (  HCC) 08/21/2013   Constipation 08/19/2013   Lumbosacral spondylosis without myelopathy 11/19/2012   Morbid obesity with BMI of 45.0-49.9, adult (HCC) 01/05/2012   Bilateral deep vein thromboses (HCC) 12/13/2011   Hypokalemia 11/05/2011   Sciatica 11/05/2011   Essential hypertension 11/05/2011    Diabetes type 2, controlled (HCC) 11/05/2011   Past Medical History:  Diagnosis Date   Adrenal mass (HCC) 03/226   Benign   Arthritis    knees/multiple orthopedic conditons; lower back   Asthma    per pt   Clotting disorder (HCC)    +beta-2-glycoprotein IgA antibody   COPD (chronic obstructive pulmonary disease) (HCC)    inhalers dependent on environment   Depression    Diabetes mellitus    120s usually fasting -  dx more than 10 yrs ago   Dizziness, nonspecific    DVT (deep venous thrombosis) (HCC)    Recurrent   Fibromyalgia    GERD (gastroesophageal reflux disease)    Heart murmur    History of blood transfusion    History of cocaine abuse (HCC)    Remote history    Hyperlipidemia    Hypertension    takes meds daily   Hypothyroidism    Lupus Anticoagulant Positive    On home oxygen therapy    at night   Shortness of breath    exertion or lying flat   Sleep apnea    2l of oxygen at night (as of 12/6, she used to)   Past Surgical History:  Procedure Laterality Date   ABDOMINAL HYSTERECTOMY  1989   Fibroids   ANTERIOR LUMBAR FUSION  01/03/2012   Procedure: ANTERIOR LUMBAR FUSION 1 LEVEL;  Surgeon: Carmela Hurt, MD;  Location: [facility];  Service: Neurosurgery;  Laterality: N/A;  Lumbar Four-Five Anterior Lumbar Interbody Fusion with Instrumentation   BACK SURGERY     cervical spine---disk disease   CARPAL TUNNEL RELEASE     Bilateral   CESAREAN SECTION     CHOLECYSTECTOMY     CYSTOSCOPY/RETROGRADE/URETEROSCOPY/STONE EXTRACTION WITH BASKET Right 04/26/2014   Procedure: CYSTOSCOPY/ RIGHT RETROGRADE/ RIGHT URETEROSCOPY/URETERAL AND RENAL PELVIS BIOPSY;  Surgeon: Sebastian Ache, MD;  Location: [facility];  Service: Urology;  Laterality: Right;   ESOPHAGOGASTRODUODENOSCOPY (EGD) WITH PROPOFOL N/A 08/25/2022   Procedure: ESOPHAGOGASTRODUODENOSCOPY (EGD) WITH PROPOFOL;  Surgeon: Jeani Hawking, MD;  Location: [facility];  Service: Gastroenterology;  Laterality: N/A;    gallstones removed     HEMOSTASIS CLIP PLACEMENT  08/25/2022   Procedure: HEMOSTASIS CLIP PLACEMENT;  Surgeon: Jeani Hawking, MD;  Location: [facility];  Service: Gastroenterology;;   HOT HEMOSTASIS N/A 08/25/2022   Procedure: HOT HEMOSTASIS (ARGON PLASMA COAGULATION/BICAP);  Surgeon: Jeani Hawking, MD;  Location: [facility];  Service: Gastroenterology;  Laterality: N/A;   POLYPECTOMY  08/25/2022   Procedure: POLYPECTOMY;  Surgeon: Jeani Hawking, MD;  Location: [facility];  Service: Gastroenterology;;   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   bilateral   right elbow surgery     RIGHT HEART CATH N/A 02/28/2021   Procedure: RIGHT HEART CATH;  Surgeon: Kathleene Hazel, MD;  Location: [facility]VAS[facility] CV LAB;  Service: Cardiovascular;  Laterality: N/A;   THYROID LOBECTOMY Right 02/27/2017   THYROID LOBECTOMY Right 02/27/2017   Procedure: RIGHT THYROID LOBECTOMY;  Surgeon: Harriette Bouillon, MD;  Location: [facility];  Service: General;  Laterality: Right;   TUBAL LIGATION     Social History   Tobacco Use   Smoking status: Never   Smokeless tobacco: Never  Vaping Use  Vaping status: Never Used  Substance Use Topics   Alcohol use: No    Alcohol/week: 0.0 standard drinks of alcohol    Comment: beer in past    Drug use: Yes    Types: Cocaine    Comment: quit 2003-rehab progam in 2005   Family History  Problem Relation Age of Onset   Hypertension Mother    Diabetes Mother    Cancer Mother        Pancreatic   Hypertension Father    Heart failure Father    Hyperlipidemia Other    COPD Other    Allergies  Allergen Reactions   Lisinopril Anaphylaxis, Swelling and Other (See Comments)    THROAT SWELLING  Pt states her throat started to "close up" after being on it for "so long."  THROAT SWELLING, Pt states her throat started to "close up" after being on it for "so long."   Prednisone Anaphylaxis, Swelling and Other (See Comments)    Throat swelling and tongue irritation    Spironolactone Other (See Comments)    Caused stroke-like symptoms with the mouth   Corticosteroids Rash    States she developed a rash on her tongue after a steroid injection in her back      ROS Negative unless stated above    Objective:     BP (!) 160/80 (BP Location: Left Arm, Patient Position: Sitting, Cuff Size: Large)   Pulse 86   Temp 98.5 F (36.9 C) (Oral)   Ht 5\' 6"  (1.676 m)   Wt 283 lb (128.4 kg)   SpO2 97%   BMI 45.68 kg/m  BP Readings from Last 3 Encounters:  03/05/23 (!) 160/80  02/19/23 (!) 142/86  12/22/22 (!) 140/90   Wt Readings from Last 3 Encounters:  03/05/23 283 lb (128.4 kg)  02/19/23 285 lb 9.6 oz (129.5 kg)  12/22/22 286 lb (129.7 kg)      Physical Exam Constitutional:      General: She is not in acute distress.    Appearance: Normal appearance.  HENT:     Head: Normocephalic and atraumatic.     Right Ear: Hearing and tympanic membrane normal. There is no impacted cerumen.     Left Ear: Hearing normal. There is impacted cerumen.     Ears:     Comments: Left canal with impacted cerumen.  TM not after irrigation.    Nose: Nose normal.     Mouth/Throat:     Mouth: Mucous membranes are moist.  Cardiovascular:     Rate and Rhythm: Normal rate and regular rhythm.     Heart sounds: Normal heart sounds. No murmur heard.    No gallop.  Pulmonary:     Effort: Pulmonary effort is normal. No respiratory distress.     Breath sounds: Normal breath sounds. No wheezing, rhonchi or rales.  Skin:    General: Skin is warm and dry.          Comments: Elongated abrasion on left lower pannus appears dry with eschar in place and hyperpigmented slightly rough appearing skin outlining area.  Medially 0.5 cm of the abrasion appears without eschar.  No drainage, erythema, induration.  Neurological:     Mental Status: She is alert and oriented to person, place, and time.      No results found for any visits on 03/05/23.    Assessment & Plan:  Wound  of abdomen -Abrasion of left lower abdomen healing but increasing in size per patient. -Advised to continue  keeping area clean and dry and avoid scratching.   -ok to d/c Neosporin use. -     Ambulatory referral to Wound Clinic  Vertigo -Discussed possible causes including allergies, impacted cerumen -Discussed how to perform Epley maneuver -Consider vestibular rehab.  Patient declines at this time given numerous other appointments.  Diaphoresis -Discussed possible causes including hypo or hyperglycemia, vertigo, hypo or hypertension.  Also consider arrhythmia.  Type 2 diabetes mellitus with diabetic neuropathy, without long-term current use of insulin (HCC) -Hemoglobin A1c 7.5% on 12/03/2022 -Continue Ozempic 2 mg weekly -Patient encouraged to make sure she is rotating injection sites and increase physical activity -Continue Crestor 10 mg -     Microalbumin / creatinine urine ratio  Morbid obesity (HCC) -Body mass index is 45.68 kg/m. -lifestyle modifications -Continue Ozempic 2 mg weekly  Impacted cerumen of left ear -Impaction of left canal.  Consent obtained.  Left ear irrigated.  Patient tolerated procedure well. -Avoid using anything to [facility] ear to remove wax.  Essential hypertension/CHF -Euvolemic.  BP uncontrolled.  Likely 2/2 pt not taking meds this am. -Advised to take medications even if coming to appointments -Continue current meds including diltiazem to 40 mg, Lasix 80 mg twice daily, spironolactone 12.5 mg daily. -Continue lifestyle modifications  Lumbosacral spondylosis without myelopathy -Continue follow-up with specialists -     Lidocaine; Place 1 patch onto the skin daily as needed (to painful site- Remove & Discard patch within 12 hours or as directed by MD).  Dispense: 30 patch; Refill: 4  Post-nasal drainage -     Fexofenadine HCl; Take 1 tablet (180 mg total) by mouth daily.  Dispense: 90 tablet; Refill: 3  Urinary incontinence, unspecified       -      Adult briefs  Return in about 4 months (around 07/04/2023), or if symptoms worsen or fail to improve.   [name], MD

## 2023-03-05 NOTE — Patient Instructions (Addendum)
A referral to wound clinic was placed.  They should call you about setting up an appointment.  Do not forget to take your blood pressure medication this morning.

## 2023-03-06 ENCOUNTER — Other Ambulatory Visit: Payer: 59

## 2023-03-06 LAB — MICROALBUMIN / CREATININE URINE RATIO
Creatinine,U: 128.8 mg/dL
Microalb Creat Ratio: 11.4 mg/g (ref 0.0–30.0)
Microalb, Ur: 14.6 mg/dL — ABNORMAL HIGH (ref 0.0–1.9)

## 2023-03-07 ENCOUNTER — Ambulatory Visit (INDEPENDENT_AMBULATORY_CARE_PROVIDER_SITE_OTHER): Payer: 59

## 2023-03-07 ENCOUNTER — Telehealth: Payer: Self-pay

## 2023-03-07 VITALS — Ht 66.0 in | Wt 283.0 lb

## 2023-03-07 DIAGNOSIS — Z0000 Encounter for general adult medical examination without abnormal findings: Secondary | ICD-10-CM

## 2023-03-07 NOTE — Patient Instructions (Addendum)
[name] , Thank you for taking time to come for your Medicare Wellness Visit. I appreciate your ongoing commitment to your health goals. Please review the following plan we discussed and let me know if I can assist you in the future.   Referrals/Orders/Follow-Ups/Clinician Recommendations:   This is a list of the screening recommended for you and due dates:  Health Maintenance  Topic Date Due   Hepatitis C Screening  Never done   DTaP/Tdap/Td vaccine (1 - Tdap) Never done   Zoster (Shingles) Vaccine (1 of 2) Never done   Pneumonia Vaccine (2 of 2 - PCV) 01/07/2013   Mammogram  09/30/2021   COVID-19 Vaccine (5 - 2024-25 season) 11/19/2022   Complete foot exam   12/07/2022   Hemoglobin A1C  06/03/2023   Yearly kidney function blood test for diabetes  09/11/2023   Eye exam for diabetics  02/12/2024   Yearly kidney health urinalysis for diabetes  03/05/2024   Medicare Annual Wellness Visit  03/06/2024   Colon Cancer Screening  04/20/2027   Flu Shot  Completed   DEXA scan (bone density measurement)  Completed   HPV Vaccine  Aged Out   Opioid Pain Medicine Management Opioids are powerful medicines that are used to treat moderate to severe pain. When used for short periods of time, they can help you to: Sleep better. Do better in physical or occupational therapy. Feel better in the first few days after an injury. Recover from surgery. Opioids should be taken with the supervision of a trained health care provider. They should be taken for the shortest period of time possible. This is because opioids can be addictive, and the longer you take opioids, the greater your risk of addiction. This addiction can also be called opioid use disorder. What are the risks? Using opioid pain medicines for longer than 3 days increases your risk of side effects. Side effects include: Constipation. Nausea and vomiting. Breathing difficulties (respiratory depression). Drowsiness. Confusion. Opioid use  disorder. Itching. Taking opioid pain medicine for a long period of time can affect your ability to do daily tasks. It also puts you at risk for: Motor vehicle crashes. Depression. Suicide. Heart attack. Overdose, which can be life-threatening. What is a pain treatment plan? A pain treatment plan is an agreement between you and your health care provider. Pain is unique to each person, and treatments vary depending on your condition. To manage your pain, you and your health care provider need to work together. To help you do this: Discuss the goals of your treatment, including how much pain you might expect to have and how you will manage the pain. Review the risks and benefits of taking opioid medicines. Remember that a good treatment plan uses more than one approach and minimizes the chance of side effects. Be honest about the amount of medicines you take and about any drug or alcohol use. Get pain medicine prescriptions from only one health care provider. Pain can be managed with many types of alternative treatments. Ask your health care provider to refer you to one or more specialists who can help you manage pain through: Physical or occupational therapy. Counseling (cognitive behavioral therapy). Good nutrition. Biofeedback. Massage. Meditation. Non-opioid medicine. Following a gentle exercise program. How to use opioid pain medicine Taking medicine Take your pain medicine exactly as told by your health care provider. Take it only when you need it. If your pain gets less severe, you may take less than your prescribed dose if your health  care provider approves. If you are not having pain, do nottake pain medicine unless your health care provider tells you to take it. If your pain is severe, do nottry to treat it yourself by taking more pills than instructed on your prescription. Contact your health care provider for help. Write down the times when you take your pain medicine. It is  easy to become confused while on pain medicine. Writing the time can help you avoid overdose. Take other over-the-counter or prescription medicines only as told by your health care provider. Keeping yourself and others safe  While you are taking opioid pain medicine: Do not drive, use machinery, or power tools. Do not sign legal documents. Do not drink alcohol. Do not take sleeping pills. Do not supervise children by yourself. Do not do activities that require climbing or being in high places. Do not go to a lake, river, ocean, spa, or swimming pool. Do not share your pain medicine with anyone. Keep pain medicine in a locked cabinet or in a secure area where pets and children cannot reach it. Stopping your use of opioids If you have been taking opioid medicine for more than a few weeks, you may need to slowly decrease (taper) how much you take until you stop completely. Tapering your use of opioids can decrease your risk of symptoms of withdrawal, such as: Pain and cramping in the abdomen. Nausea. Sweating. Sleepiness. Restlessness. Uncontrollable shaking (tremors). Cravings for the medicine. Do not attempt to taper your use of opioids on your own. Talk with your health care provider about how to do this. Your health care provider may prescribe a step-down schedule based on how much medicine you are taking and how long you have been taking it. Getting rid of leftover pills Do not save any leftover pills. Get rid of leftover pills safely by: Taking the medicine to a prescription take-back program. This is usually offered by the county or law enforcement. Bringing them to a pharmacy that has a drug disposal container. Flushing them down the toilet. Check the label or package insert of your medicine to see whether this is safe to do. Throwing them out in the trash. Check the label or package insert of your medicine to see whether this is safe to do. If it is safe to throw it out, remove  the medicine from the original container, put it into a sealable bag or container, and mix it with used coffee grounds, food scraps, dirt, or cat litter before putting it in the trash. Follow these instructions at home: Activity Do exercises as told by your health care provider. Avoid activities that make your pain worse. Return to your normal activities as told by your health care provider. Ask your health care provider what activities are safe for you. General instructions You may need to take these actions to prevent or treat constipation: Drink enough fluid to keep your urine pale yellow. Take over-the-counter or prescription medicines. Eat foods that are high in fiber, such as beans, whole grains, and fresh fruits and vegetables. Limit foods that are high in fat and processed sugars, such as fried or sweet foods. Keep all follow-up visits. This is important. Where to find support If you have been taking opioids for a long time, you may benefit from receiving support for quitting from a local support group or counselor. Ask your health care provider for a referral to these resources in your area. Where to find more information Centers for Disease Control and Prevention (  CDC): FootballExhibition.com.br [place] Food and Drug Administration (FDA): PumpkinSearch.com.ee Get help right away if: You may have taken too much of an opioid (overdosed). Common symptoms of an overdose: Your breathing is slower or more shallow than normal. You have a very slow heartbeat (pulse). You have slurred speech. You have nausea and vomiting. Your pupils become very small. You have other potential symptoms: You are very confused. You faint or feel like you will faint. You have cold, clammy skin. You have blue lips or fingernails. You have thoughts of harming yourself or harming others. These symptoms may represent a serious problem that is an emergency. Do not wait to see if the symptoms will go away. Get medical help right away.  Call your local emergency services (911 in the [place] Do not drive yourself to the hospital.  If you ever feel like you may hurt yourself or others, or have thoughts about taking your own life, get help right away. Go to your nearest emergency department or: Call your local emergency services (911 in the [place] Call the [facility] (647-783-8765 in the [place] Call a suicide crisis helpline, such as the National Suicide Prevention Lifeline at 713-147-4622 or 988 in the [place] This is open 24 hours a day in the [place] Text the Crisis Text Line at 435-254-1339 (in the [place] Summary Opioid medicines can help you manage moderate to severe pain for a short period of time. A pain treatment plan is an agreement between you and your health care provider. Discuss the goals of your treatment, including how much pain you might expect to have and how you will manage the pain. If you think that you or someone else may have taken too much of an opioid, get medical help right away. This information is not intended to replace advice given to you by your health care provider. Make sure you discuss any questions you have with your health care provider. Document Revised: 09/29/2020 Document Reviewed: 06/16/2020 [employer] Patient Education  2024 [employer]  Advanced directives: (Declined) Advance directive discussed with you today. Even though you declined this today, please call our office should you change your mind, and we can give you the proper paperwork for you to fill out.  Next Medicare Annual Wellness Visit scheduled for next year: Yes

## 2023-03-07 NOTE — Progress Notes (Signed)
Subjective:   [name] is a 67 y.o. female who presents for Medicare Annual (Subsequent) preventive examination.  Visit Complete: Virtual I connected with  Aqueelah Bureau Bickley on 03/07/23 by a audio enabled telemedicine application and verified that I am speaking with the correct person using two identifiers.  Patient Location: Home  Provider Location: Home Office  I discussed the limitations of evaluation and management by telemedicine. The patient expressed understanding and agreed to proceed.  Vital Signs: Because this visit was a virtual/telehealth visit, some criteria may be missing or patient reported. Any vitals not documented were not able to be obtained and vitals that have been documented are patient reported.    Cardiac Risk Factors include: advanced age (>81men, >63 women);diabetes mellitus;hypertension     Objective:    Today's Vitals   03/07/23 1801  Weight: 283 lb (128.4 kg)  Height: 5\' 6"  (1.676 m)   Body mass index is 45.68 kg/m.     03/07/2023    6:23 PM 02/27/2023    9:51 AM 02/19/2023   11:22 AM 08/25/2022   12:50 PM 08/23/2022    8:00 PM 07/27/2022    9:39 AM 03/22/2022    9:19 AM  Advanced Directives  Does Patient Have a Medical Advance Directive? No No No No No No No  Would patient like information on creating a medical advance directive? No - Patient declined Yes (MAU/Ambulatory/Procedural Areas - Information given) Yes (MAU/Ambulatory/Procedural Areas - Information given)  No - Patient declined No - Patient declined No - Patient declined    Current Medications (verified) Outpatient Encounter Medications as of 03/07/2023  Medication Sig   ACCU-CHEK GUIDE test strip USE TO check blood glucose UP TO four times daily AS DIRECTED   Accu-Chek Softclix Lancets lancets USE TO check blood glucose UP TO four times daily AS DIRECTED   albuterol (PROVENTIL) (2.5 MG/3ML) 0.083% nebulizer solution USE 1 VIAL IN NEBULIZER EVERY 6 HOURS - and as needed    albuterol (VENTOLIN HFA) 108 (90 Base) MCG/ACT inhaler INHALE TWO (2) PUFFS BY MOUTH EVERY 4 HOURS AS NEEDED FOR SHORTNESS OF BREATH   blood glucose meter kit and supplies KIT Dispense based on patient and insurance preference. Use up to four times daily as directed. (FOR ICD-9 250.00, 250.01).   canagliflozin (INVOKANA) 100 MG TABS tablet Take 1 tablet (100 mg total) by mouth daily before breakfast.   cloNIDine (CATAPRES) 0.1 MG tablet TAKE ONE TABLET BY MOUTH BEFORE BREAKFAST and TAKE ONE TABLET BY MOUTH EVERYDAY AT BEDTIME   diclofenac sodium (VOLTAREN) 1 % GEL Apply 2 g topically daily as needed (fibromyalgia pain- affected sites).   diltiazem (CARDIZEM CD) 240 MG 24 hr capsule TAKE 1 CAPSULE BY MOUTH DAILY BEFORE BREAKFAST *REFILL REQUEST*   DULoxetine (CYMBALTA) 30 MG capsule Take 90 mg by mouth at bedtime.   FEROSUL 325 (65 Fe) MG tablet TAKE ONE TABLET BY MOUTH ONCE DAILY   fexofenadine (ALLEGRA) 180 MG tablet Take 1 tablet (180 mg total) by mouth daily.   fluticasone (FLONASE) 50 MCG/ACT nasal spray Place 2 sprays into both nostrils 2 (two) times daily. (Patient taking differently: Place 2 sprays into both nostrils 2 (two) times daily as needed for allergies or rhinitis.)   furosemide (LASIX) 80 MG tablet TAKE ONE (1) TABLET BY MOUTH TWICE DAILY   gabapentin (NEURONTIN) 300 MG capsule TAKE ONE CAPSULE BY MOUTH TWICE DAILY   Incontinence Supply Disposable (PROCARE BARIATRIC BRIEFS) MISC Use as needed.   lidocaine (LIDODERM)  5 % Place 1 patch onto the skin daily as needed (to painful site- Remove & Discard patch within 12 hours or as directed by MD).   LINZESS 145 MCG CAPS capsule TAKE 1 CAPSULE BY MOUTH EVERY OTHER DAY   metFORMIN (GLUCOPHAGE) 500 MG tablet TAKE 1 TABLET BY MOUTH EVERY DAY AT BEDTIME *REFILL REQUEST*   methocarbamol (ROBAXIN) 500 MG tablet Take 1 tablet (500 mg total) by mouth 2 (two) times daily. (Patient taking differently: Take 500 mg by mouth 2 (two) times daily as needed  for muscle spasms.)   omeprazole (PRILOSEC) 20 MG capsule Take 1 capsule (20 mg total) by mouth 2 (two) times daily before a meal.   potassium chloride (MICRO-K) 10 MEQ CR capsule TAKE TWO CAPSULES BY MOUTH EVERYDAY AT BEDTIME   QUEtiapine (SEROQUEL) 50 MG tablet Take 50 mg by mouth in the morning and at bedtime.   rosuvastatin (CRESTOR) 10 MG tablet Take 1 tablet (10 mg total) by mouth daily.   Semaglutide, 2 MG/DOSE, (OZEMPIC, 2 MG/DOSE,) 8 MG/3ML SOPN Inject 2 mg into the skin once a week.   spironolactone (ALDACTONE) 25 MG tablet TAKE 1/2 TABLET BY MOUTH ONCE DAILY   SYMBICORT 160-4.5 MCG/ACT inhaler INHALE TWO PUFFS BY MOUTH INTO LUNGS twice daily (Patient taking differently: Inhale 2 puffs into the lungs 2 (two) times daily.)   traMADol (ULTRAM) 50 MG tablet Take 1 tablet (50 mg total) by mouth 3 (three) times daily as needed.   warfarin (COUMADIN) 10 MG tablet Take daily as directed by MD   warfarin (COUMADIN) 7.5 MG tablet TAKE 1 TABLET BY MOUTH DAILY   zonisamide (ZONEGRAN) 100 MG capsule Take 1-2 capsules (100-200 mg total) by mouth See admin instructions. Take 100 mg by mouth in the morning and 200 mg in the evening   Facility-Administered Encounter Medications as of 03/07/2023  Medication   sodium chloride flush (NS) 0.9 % injection 3 mL    Allergies (verified) Lisinopril, Prednisone, Spironolactone, and Corticosteroids   History: Past Medical History:  Diagnosis Date   Adrenal mass (HCC) 03/226   Benign   Arthritis    knees/multiple orthopedic conditons; lower back   Asthma    per pt   Clotting disorder (HCC)    +beta-2-glycoprotein IgA antibody   COPD (chronic obstructive pulmonary disease) (HCC)    inhalers dependent on environment   Depression    Diabetes mellitus    120s usually fasting -  dx more than 10 yrs ago   Dizziness, nonspecific    DVT (deep venous thrombosis) (HCC)    Recurrent   Fibromyalgia    GERD (gastroesophageal reflux disease)    Heart  murmur    History of blood transfusion    History of cocaine abuse (HCC)    Remote history    Hyperlipidemia    Hypertension    takes meds daily   Hypothyroidism    Lupus Anticoagulant Positive    On home oxygen therapy    at night   Shortness of breath    exertion or lying flat   Sleep apnea    2l of oxygen at night (as of 12/6, she used to)   Past Surgical History:  Procedure Laterality Date   ABDOMINAL HYSTERECTOMY  1989   Fibroids   ANTERIOR LUMBAR FUSION  01/03/2012   Procedure: ANTERIOR LUMBAR FUSION 1 LEVEL;  Surgeon: Carmela Hurt, MD;  Location: [facility];  Service: Neurosurgery;  Laterality: N/A;  Lumbar Four-Five Anterior Lumbar Interbody Fusion  with Instrumentation   BACK SURGERY     cervical spine---disk disease   CARPAL TUNNEL RELEASE     Bilateral   CESAREAN SECTION     CHOLECYSTECTOMY     CYSTOSCOPY/RETROGRADE/URETEROSCOPY/STONE EXTRACTION WITH BASKET Right 04/26/2014   Procedure: CYSTOSCOPY/ RIGHT RETROGRADE/ RIGHT URETEROSCOPY/URETERAL AND RENAL PELVIS BIOPSY;  Surgeon: Sebastian Ache, MD;  Location: [facility];  Service: Urology;  Laterality: Right;   ESOPHAGOGASTRODUODENOSCOPY (EGD) WITH PROPOFOL N/A 08/25/2022   Procedure: ESOPHAGOGASTRODUODENOSCOPY (EGD) WITH PROPOFOL;  Surgeon: Jeani Hawking, MD;  Location: [facility];  Service: Gastroenterology;  Laterality: N/A;   gallstones removed     HEMOSTASIS CLIP PLACEMENT  08/25/2022   Procedure: HEMOSTASIS CLIP PLACEMENT;  Surgeon: Jeani Hawking, MD;  Location: [facility];  Service: Gastroenterology;;   HOT HEMOSTASIS N/A 08/25/2022   Procedure: HOT HEMOSTASIS (ARGON PLASMA COAGULATION/BICAP);  Surgeon: Jeani Hawking, MD;  Location: [facility];  Service: Gastroenterology;  Laterality: N/A;   POLYPECTOMY  08/25/2022   Procedure: POLYPECTOMY;  Surgeon: Jeani Hawking, MD;  Location: [facility];  Service: Gastroenterology;;   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   bilateral   right elbow surgery     RIGHT HEART CATH  N/A 02/28/2021   Procedure: RIGHT HEART CATH;  Surgeon: Kathleene Hazel, MD;  Location: [facility] CV [facility];  Service: Cardiovascular;  Laterality: N/A;   THYROID LOBECTOMY Right 02/27/2017   THYROID LOBECTOMY Right 02/27/2017   Procedure: RIGHT THYROID LOBECTOMY;  Surgeon: Harriette Bouillon, MD;  Location: [facility];  Service: General;  Laterality: Right;   TUBAL LIGATION     Family History  Problem Relation Age of Onset   Hypertension Mother    Diabetes Mother    Cancer Mother        Pancreatic   Hypertension Father    Heart failure Father    Hyperlipidemia Other    COPD Other    Social History   Socioeconomic History   Marital status: Divorced    Spouse name: Not on file   Number of children: Not on file   Years of education: Not on file   Highest education level: Not on file  Occupational History   Not on file  Tobacco Use   Smoking status: Never   Smokeless tobacco: Never  Vaping Use   Vaping status: Never Used  Substance and Sexual Activity   Alcohol use: No    Alcohol/week: 0.0 standard drinks of alcohol    Comment: beer in past    Drug use: Yes    Types: Cocaine    Comment: quit 2003-rehab progam in 2005   Sexual activity: Never  Other Topics Concern   Not on file  Social History Narrative   Lives alone in apartment, drives   Social Drivers of Health   Financial Resource Strain: Low Risk  (03/07/2023)   Overall Financial Resource Strain (CARDIA)    Difficulty of Paying Living Expenses: Not hard at all  Food Insecurity: No Food Insecurity (03/07/2023)   Hunger Vital Sign    Worried About Running Out of Food in the Last Year: Never true    Ran Out of Food in the Last Year: Never true  Transportation Needs: No Transportation Needs (03/07/2023)   PRAPARE - Administrator, Civil Service (Medical): No    Lack of Transportation (Non-Medical): No  Physical Activity: Insufficiently Active (03/07/2023)   Exercise Vital Sign    Days of Exercise  per Week: 5 days    Minutes of Exercise per Session: 10  min  Stress: No Stress Concern Present (03/07/2023)   [employer] of Occupational Health - Occupational Stress Questionnaire    Feeling of Stress : Not at all  Social Connections: Moderately Integrated (03/07/2023)   Social Connection and Isolation Panel [NHANES]    Frequency of Communication with Friends and Family: More than three times a week    Frequency of Social Gatherings with Friends and Family: More than three times a week    Attends Religious Services: More than 4 times per year    Active Member of [employer] or Organizations: Yes    Attends Engineer, structural: More than 4 times per year    Marital Status: Divorced    Tobacco Counseling Counseling given: Not Answered   Clinical Intake:  Pre-visit preparation completed: Yes  Pain : No/denies pain     Nutritional Risks: None Diabetes: Yes CBG done?: Yes (CBG 124 Per patient) CBG resulted in Enter/ Edit results?: Yes Did pt. bring in CBG monitor from home?: No  How often do you need to have someone help you when you read instructions, pamphlets, or other written materials from your doctor or pharmacy?: 1 - Never  Interpreter Needed?: No  Information entered by :: Theresa Mulligan LPN   Activities of Daily Living    03/07/2023    6:15 PM 08/23/2022    8:00 PM  In your present state of health, do you have any difficulty performing the following activities:  Hearing? 0 0  Vision? 0 0  Difficulty concentrating or making decisions? 0 0  Walking or climbing stairs? 1 0  Comment Uses a Restaurant manager, fast food   Dressing or bathing? 1 0  Comment Aide assist   Doing errands, shopping? 1 0  Comment Aide assist   Preparing Food and eating ? N   Using the Toilet? N   In the past six months, have you accidently leaked urine? Y   Comment Wears Breifs. Followed by PCP   Do you have problems with loss of bowel control? N   Managing your Medications? Y    Comment Aide assist   Managing your Finances? N   Housekeeping or managing your Housekeeping? Y     Patient Care Team: [name], MD as PCP - General (Family Medicine) Christell Constant, MD as PCP - Cardiology (Cardiology) [place], Milas Kocher, [facility] (Pharmacist)  Indicate any recent Medical Services you may have received from other than [facility] providers in the past year (date may be approximate).     Assessment:   This is a routine wellness examination for [facility].  Hearing/Vision screen Hearing Screening - Comments:: Denies hearing difficulties   Vision Screening - Comments:: Wears rx glasses - up to date with routine eye exams with  Dr Dione Booze   Goals Addressed               This Visit's Progress     Lose weight (pt-stated)         Depression Screen    03/07/2023    6:12 PM 12/22/2022    8:39 AM 09/06/2022   11:13 AM 06/22/2022   10:00 AM 04/20/2022    8:37 AM 03/27/2022    2:52 PM 03/03/2022    9:58 AM  PHQ 2/9 Scores  PHQ - 2 Score 0 2 1 0 1 2 1   PHQ- 9 Score  10 10  4 6 1     Fall Risk    03/07/2023    6:19 PM 03/05/2023  11:04 AM 12/22/2022    8:39 AM 06/22/2022   10:00 AM 04/20/2022    8:36 AM  Fall Risk   Falls in the past year? 1 0 0 0 0  Number falls in past yr: 0 0 0 0 0  Injury with Fall? 0 0 0 0 0  Risk for fall due to : No Fall Risks  No Fall Risks  No Fall Risks  Follow up Falls prevention discussed  Falls evaluation completed Falls evaluation completed Falls evaluation completed    MEDICARE RISK AT HOME: Medicare Risk at Home Any stairs in or around the home?: Yes If so, are there any without handrails?: No Home free of loose throw rugs in walkways, pet beds, electrical cords, etc?: Yes Adequate lighting in your home to reduce risk of falls?: Yes Life alert?: No Use of a cane, walker or w/c?: Yes Grab bars in the bathroom?: Yes Shower chair or bench in shower?: Yes Elevated toilet seat or a handicapped toilet?: Yes  TIMED UP AND  GO:  Was the test performed?  No    Cognitive Function:        03/07/2023    6:23 PM 03/03/2022   10:04 AM 03/02/2021    3:47 PM 03/05/2020   11:12 AM  6CIT Screen  What Year? 0 points 0 points 0 points 0 points  What month? 0 points 0 points 0 points 0 points  What time? 0 points 0 points 0 points 0 points  Count back from 20 0 points 0 points  0 points  Months in reverse 0 points 0 points 0 points 2 points  Repeat phrase 0 points 0 points 0 points 10 points  Total Score 0 points 0 points  12 points    Immunizations Immunization History  Administered Date(s) Administered   Fluad Trivalent(High Dose 65+) 12/22/2022   Influenza Split 01/08/2012   Influenza,inj,Quad PF,6+ Mos 12/31/2018   Influenza-Unspecified 12/18/2020   PFIZER Comirnaty(Gray Top)Covid-19 Tri-Sucrose Vaccine 11/12/2020   PFIZER(Purple Top)SARS-COV-2 Vaccination 06/30/2019, 07/21/2019   Pfizer Covid-19 Vaccine Bivalent Booster 34yrs & up 02/14/2021   Pneumococcal Polysaccharide-23 01/08/2012   b TDAP status: Due, Education has been provided regarding the importance of this vaccine. Advised may receive this vaccine at local pharmacy or Health Dept. Aware to provide a copy of the vaccination record if obtained from local pharmacy or Health Dept. Verbalized acceptance and understanding.  Flu Vaccine status: Up to date  Pneumococcal vaccine status: Due, Education has been provided regarding the importance of this vaccine. Advised may receive this vaccine at local pharmacy or Health Dept. Aware to provide a copy of the vaccination record if obtained from local pharmacy or Health Dept. Verbalized acceptance and understanding.  Covid-19 vaccine status: Declined, Education has been provided regarding the importance of this vaccine but patient still declined. Advised may receive this vaccine at local pharmacy or Health Dept.or vaccine clinic. Aware to provide a copy of the vaccination record if obtained from local  pharmacy or Health Dept. Verbalized acceptance and understanding.  Qualifies for Shingles Vaccine? Yes   Zostavax completed No   Shingrix Completed?: No.    Education has been provided regarding the importance of this vaccine. Patient has been advised to call insurance company to determine out of pocket expense if they have not yet received this vaccine. Advised may also receive vaccine at local pharmacy or Health Dept. Verbalized acceptance and understanding.  Screening Tests Health Maintenance  Topic Date Due   Hepatitis C Screening  Never done   DTaP/Tdap/Td (1 - Tdap) Never done   Zoster Vaccines- Shingrix (1 of 2) Never done   Pneumonia Vaccine 65+ Years old (2 of 2 - PCV) 01/07/2013   MAMMOGRAM  09/30/2021   COVID-19 Vaccine (5 - 2024-25 season) 11/19/2022   FOOT EXAM  12/07/2022   HEMOGLOBIN A1C  06/03/2023   Diabetic kidney evaluation - eGFR measurement  09/11/2023   OPHTHALMOLOGY EXAM  02/12/2024   Diabetic kidney evaluation - Urine ACR  03/05/2024   Medicare Annual Wellness (AWV)  03/06/2024   Colonoscopy  04/20/2027   INFLUENZA VACCINE  Completed   DEXA SCAN  Completed   HPV VACCINES  Aged Out    Health Maintenance  Health Maintenance Due  Topic Date Due   Hepatitis C Screening  Never done   DTaP/Tdap/Td (1 - Tdap) Never done   Zoster Vaccines- Shingrix (1 of 2) Never done   Pneumonia Vaccine 49+ Years old (2 of 2 - PCV) 01/07/2013   MAMMOGRAM  09/30/2021   COVID-19 Vaccine (5 - 2024-25 season) 11/19/2022   FOOT EXAM  12/07/2022    Colorectal cancer screening: Type of screening: Colonoscopy. Completed 04/19/17. Repeat every 10 years  Mammogram status: Ordered Deferred. Pt provided with contact info and advised to call to schedule appt.   Bone Density status: Completed 02/18/16. Results reflect: Bone density results: NORMAL. Repeat every   years.     Additional Screening:  Hepatitis C Screening: does qualify; Completed Deferred  Vision Screening:  Recommended annual ophthalmology exams for early detection of glaucoma and other disorders of the eye. Is the patient up to date with their annual eye exam?  Yes  Who is the provider or what is the name of the office in which the patient attends annual eye exams? Dr Dione Booze If pt is not established with a provider, would they like to be referred to a provider to establish care? No .   Dental Screening: Recommended annual dental exams for proper oral hygiene  Diabetic Foot Exam: Diabetic Foot Exam: Overdue, Pt has been advised about the importance in completing this exam. Pt is scheduled for diabetic foot exam on Deferred.  Community Resource Referral / Chronic Care Management:  CRR required this visit?  No   CCM required this visit?  No     Plan:     I have personally reviewed and noted the following in the patient's chart:   Medical and social history Use of alcohol, tobacco or illicit drugs  Current medications and supplements including opioid prescriptions. Patient is currently taking opioid prescriptions. Information provided to patient regarding non-opioid alternatives. Patient advised to discuss non-opioid treatment plan with their provider. Functional ability and status Nutritional status Physical activity Advanced directives List of other physicians Hospitalizations, surgeries, and ER visits in previous 12 months Vitals Screenings to include cognitive, depression, and falls Referrals and appointments  In addition, I have reviewed and discussed with patient certain preventive protocols, quality metrics, and best practice recommendations. A written personalized care plan for preventive services as well as general preventive health recommendations were provided to patient.     Tillie Rung, LPN   16/12/9602   After Visit Summary: (MyChart) Due to this being a telephonic visit, the after visit summary with patients personalized plan was offered to patient via MyChart    Nurse Notes: None

## 2023-03-07 NOTE — Telephone Encounter (Signed)
Copied from CRM [id number]. Topic: Appointments - Scheduling Inquiry for Clinic >> Mar 07, 2023 11:35 AM Irine Seal wrote: Reason for CRM: PT has scheduled appt for 03/07/23 11:15, PT has not received the call for telephone visit, she is wondering why she has not been contacted. Stated she  has no missed calls

## 2023-03-08 ENCOUNTER — Ambulatory Visit: Payer: 59 | Admitting: Occupational Therapy

## 2023-03-12 DIAGNOSIS — G4733 Obstructive sleep apnea (adult) (pediatric): Secondary | ICD-10-CM | POA: Diagnosis not present

## 2023-03-13 ENCOUNTER — Other Ambulatory Visit: Payer: Self-pay | Admitting: Family Medicine

## 2023-03-13 DIAGNOSIS — E876 Hypokalemia: Secondary | ICD-10-CM

## 2023-03-13 DIAGNOSIS — J4540 Moderate persistent asthma, uncomplicated: Secondary | ICD-10-CM

## 2023-03-16 ENCOUNTER — Ambulatory Visit: Payer: 59

## 2023-03-19 ENCOUNTER — Telehealth: Payer: Self-pay | Admitting: *Deleted

## 2023-03-19 ENCOUNTER — Inpatient Hospital Stay: Payer: 59

## 2023-03-19 DIAGNOSIS — Z7901 Long term (current) use of anticoagulants: Secondary | ICD-10-CM

## 2023-03-19 DIAGNOSIS — Z86718 Personal history of other venous thrombosis and embolism: Secondary | ICD-10-CM | POA: Diagnosis not present

## 2023-03-19 DIAGNOSIS — D649 Anemia, unspecified: Secondary | ICD-10-CM

## 2023-03-19 DIAGNOSIS — L819 Disorder of pigmentation, unspecified: Secondary | ICD-10-CM | POA: Diagnosis not present

## 2023-03-19 LAB — CBC WITH DIFFERENTIAL (CANCER CENTER ONLY)
Abs Immature Granulocytes: 0.01 10*3/uL (ref 0.00–0.07)
Basophils Absolute: 0 10*3/uL (ref 0.0–0.1)
Basophils Relative: 1 %
Eosinophils Absolute: 0.2 10*3/uL (ref 0.0–0.5)
Eosinophils Relative: 3 %
HCT: 39.7 % (ref 36.0–46.0)
Hemoglobin: 12.4 g/dL (ref 12.0–15.0)
Immature Granulocytes: 0 %
Lymphocytes Relative: 32 %
Lymphs Abs: 1.8 10*3/uL (ref 0.7–4.0)
MCH: 25.7 pg — ABNORMAL LOW (ref 26.0–34.0)
MCHC: 31.2 g/dL (ref 30.0–36.0)
MCV: 82.4 fL (ref 80.0–100.0)
Monocytes Absolute: 0.4 10*3/uL (ref 0.1–1.0)
Monocytes Relative: 7 %
Neutro Abs: 3.2 10*3/uL (ref 1.7–7.7)
Neutrophils Relative %: 57 %
Platelet Count: 350 10*3/uL (ref 150–400)
RBC: 4.82 MIL/uL (ref 3.87–5.11)
RDW: 19.7 % — ABNORMAL HIGH (ref 11.5–15.5)
WBC Count: 5.6 10*3/uL (ref 4.0–10.5)
nRBC: 0 % (ref 0.0–0.2)

## 2023-03-19 LAB — PROTIME-INR
INR: 1.1 (ref 0.8–1.2)
Prothrombin Time: 13.9 s (ref 11.4–15.2)

## 2023-03-19 NOTE — Telephone Encounter (Signed)
Notified patient of INR 1.1. She reports no missed doses-still on 7.5 mg every day, except 10 mg on MWF. No new medications or vitamins. She has been eating a lot of greens over past week or so--daily. She is finished with this now since holiday is over. Per Dr. Truett Perna: Continue same warfarin dose and recheck in 3 weeks. Scheduling message sent

## 2023-03-19 NOTE — Telephone Encounter (Signed)
-----   Message from Thornton Papas sent at 03/19/2023 10:20 AM EST ----- Please call patient, if she taking?,  Vitamins?

## 2023-03-20 ENCOUNTER — Encounter: Payer: Self-pay | Admitting: Occupational Therapy

## 2023-03-20 ENCOUNTER — Ambulatory Visit: Payer: 59 | Admitting: Occupational Therapy

## 2023-03-20 DIAGNOSIS — M25642 Stiffness of left hand, not elsewhere classified: Secondary | ICD-10-CM | POA: Diagnosis not present

## 2023-03-20 DIAGNOSIS — M6281 Muscle weakness (generalized): Secondary | ICD-10-CM

## 2023-03-20 DIAGNOSIS — R278 Other lack of coordination: Secondary | ICD-10-CM | POA: Diagnosis not present

## 2023-03-20 DIAGNOSIS — M79642 Pain in left hand: Secondary | ICD-10-CM | POA: Diagnosis not present

## 2023-03-20 DIAGNOSIS — M65332 Trigger finger, left middle finger: Secondary | ICD-10-CM | POA: Diagnosis not present

## 2023-03-20 NOTE — Patient Instructions (Signed)
 Scar Tissue Massage    Place pad of fingertip on scar area. Apply steady downward pressure while moving in circular fashion. Use another fin-ger on top to assist. Repeat until entire scar has been covered. Do for approx 5 minutes. Do _2___ sessions per day.   Flexor Tendon Gliding (Active Hook Fist)   With fingers and knuckles straight, bend middle and tip joints. Do not bend large knuckles. Repeat _10-15___ times. Do _4-6___ sessions per day.  MP Flexion (Active)   With back of hand on table, bend large knuckles as far as they will go, keeping small joints straight. Repeat _10-15___ times. Do __4-6__ sessions per day.      Finger Flexion / Extension   Bend fingers of left hand toward palm, making a  fist. Straighten big knuckles, then staighten fingers, opening fist. Repeat sequence _10-15___ times per session. Do _4-6__ sessions per day.

## 2023-03-20 NOTE — Therapy (Signed)
 OUTPATIENT OCCUPATIONAL THERAPY ORTHO TREATMENT  Patient Name: [name] MRN: [id number] DOB:Jun 29, 1955, 67 y.o., female Today's Date: 03/20/2023  PCP: Mercer Clotilda SAUNDERS, Md REFERRING PROVIDER: Arlinda Buster, MD  END OF SESSION:  OT End of Session - 03/20/23 1012     Visit Number 2    Number of Visits 8    Date for OT Re-Evaluation 04/30/23    Authorization Type [employer] Dual complete - covered at 100%    OT Start Time 1010    OT Stop Time 1045    OT Time Calculation (min) 35 min    Activity Tolerance Patient tolerated treatment well             Past Medical History:  Diagnosis Date   Adrenal mass (HCC) 03/226   Benign   Arthritis    knees/multiple orthopedic conditons; lower back   Asthma    per pt   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   COPD (chronic obstructive pulmonary disease) (HCC)    inhalers dependent on environment   Depression    Diabetes mellitus    120s usually fasting -  dx more than 10 yrs ago   Dizziness, nonspecific    DVT (deep venous thrombosis) (HCC)    Recurrent   Fibromyalgia    GERD (gastroesophageal reflux disease)    Heart murmur    History of blood transfusion    History of cocaine abuse (HCC)    Remote history    Hyperlipidemia    Hypertension    takes meds daily   Hypothyroidism    Lupus Anticoagulant Positive    On home oxygen  therapy    at night   Shortness of breath    exertion or lying flat   Sleep apnea    2l of oxygen  at night (as of 12/6, she used to)   Past Surgical History:  Procedure Laterality Date   ABDOMINAL HYSTERECTOMY  1989   Fibroids   ANTERIOR LUMBAR FUSION  01/03/2012   Procedure: ANTERIOR LUMBAR FUSION 1 LEVEL;  Surgeon: Rockey LITTIE Peru, MD;  Location: [facility];  Service: Neurosurgery;  Laterality: N/A;  Lumbar Four-Five Anterior Lumbar Interbody Fusion with Instrumentation   BACK SURGERY     cervical spine---disk disease   CARPAL TUNNEL RELEASE     Bilateral    CESAREAN SECTION     CHOLECYSTECTOMY     CYSTOSCOPY/RETROGRADE/URETEROSCOPY/STONE EXTRACTION WITH BASKET Right 04/26/2014   Procedure: CYSTOSCOPY/ RIGHT RETROGRADE/ RIGHT URETEROSCOPY/URETERAL AND RENAL PELVIS BIOPSY;  Surgeon: Ricardo Likens, MD;  Location: [facility];  Service: Urology;  Laterality: Right;   ESOPHAGOGASTRODUODENOSCOPY (EGD) WITH PROPOFOL  N/A 08/25/2022   Procedure: ESOPHAGOGASTRODUODENOSCOPY (EGD) WITH PROPOFOL ;  Surgeon: Rollin Dover, MD;  Location: [facility];  Service: Gastroenterology;  Laterality: N/A;   gallstones removed     HEMOSTASIS CLIP PLACEMENT  08/25/2022   Procedure: HEMOSTASIS CLIP PLACEMENT;  Surgeon: Rollin Dover, MD;  Location: [facility];  Service: Gastroenterology;;   HOT HEMOSTASIS N/A 08/25/2022   Procedure: HOT HEMOSTASIS (ARGON PLASMA COAGULATION/BICAP);  Surgeon: Rollin Dover, MD;  Location: [facility];  Service: Gastroenterology;  Laterality: N/A;   POLYPECTOMY  08/25/2022   Procedure: POLYPECTOMY;  Surgeon: Rollin Dover, MD;  Location: [facility];  Service: Gastroenterology;;   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   bilateral   right elbow surgery     RIGHT HEART CATH N/A 02/28/2021   Procedure: RIGHT HEART CATH;  Surgeon: Verlin Lonni BIRCH, MD;  Location: [facility];  Service: Cardiovascular;  Laterality: N/A;   THYROID  LOBECTOMY Right 02/27/2017   THYROID  LOBECTOMY Right 02/27/2017   Procedure: RIGHT THYROID  LOBECTOMY;  Surgeon: Vanderbilt Ned, MD;  Location: [facility];  Service: General;  Laterality: Right;   TUBAL LIGATION     Patient Active Problem List   Diagnosis Date Noted   Type 2 diabetes mellitus without complications (HCC) 03/05/2023   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   Chronic idiopathic constipation 03/05/2023   Gastro-esophageal reflux disease without esophagitis 03/05/2023   Iron  deficiency anemia 03/05/2023   Screening for malignant neoplasm of colon 03/05/2023   Generalized anxiety disorder 10/19/2022   Major  depressive disorder, recurrent, moderate (HCC) 10/19/2022   Anemia of chronic disease 08/23/2022   Left knee pain 08/23/2022   Mixed hyperlipidemia 10/07/2021   Paroxysmal SVT (supraventricular tachycardia) (HCC) 10/07/2021   Allergic rhinitis 11/29/2020   Allergic rhinitis due to pollen 11/29/2020   Moderate persistent asthma, uncomplicated 11/29/2020   Osteoarthritis of spine with radiculopathy, lumbar region 08/24/2020   Chronic back pain 08/18/2020   Obesity, diabetes, and hypertension syndrome (HCC) 02/27/2020   Vitreomacular adhesion of both eyes 02/09/2020   Pseudophakia of both eyes 02/09/2020   Chronic diastolic CHF (congestive heart failure) (HCC) 03/12/2019   Hypernatremia 03/09/2019   Suspected COVID-19 virus infection 03/09/2019   Thyroid  mass 02/27/2017   Pain in right hip 05/24/2016   Effusion, right knee 04/28/2016   Presence of right artificial knee joint 04/28/2016   Abnormal LFTs    Decreased sensation    Paresthesia 06/12/2014   Obstruction of kidney 04/24/2014   Chronic anticoagulation 04/24/2014   Crack cocaine use 04/24/2014   Depression 04/24/2014   Fibromyalgia 04/24/2014   Obstructive sleep apnea on CPAP 04/24/2014   Lupus anticoagulant positive 04/24/2014   Adrenal mass, right (HCC) 04/24/2014   Right kidney mass 04/24/2014   Hydronephrosis of right kidney 04/24/2014   Supratherapeutic INR 04/24/2014   Renal mass 04/24/2014   Chronic obstructive pulmonary disease (HCC) 04/24/2014   Right lower quadrant abdominal pain    Morbid obesity (HCC) 08/21/2013   Constipation 08/19/2013   Lumbosacral spondylosis without myelopathy 11/19/2012   Morbid obesity with BMI of 45.0-49.9, adult (HCC) 01/05/2012   Bilateral deep vein thromboses (HCC) 12/13/2011   Hypokalemia 11/05/2011   Sciatica 11/05/2011   Essential hypertension 11/05/2011   Diabetes type 2, controlled (HCC) 11/05/2011    ONSET DATE: 01/31/2023  REFERRING DIAG: F34.667 (ICD-10-CM) -  Trigger finger, left middle finger Note:  S/P Left long finger trigger release Referral placed occupational therapy for range of motion exercises with transition to strengthening as appropriate. Can transition to home exercise program as well.  THERAPY DIAG:  Pain in left hand  Stiffness of left hand, not elsewhere classified  Muscle weakness (generalized)  Rationale for Evaluation and Treatment: Rehabilitation  SUBJECTIVE:   SUBJECTIVE STATEMENT: I still have pain Pt accompanied by: self  PERTINENT HISTORY: DM, Asthma, bronchitis, HTN  PRECAUTIONS: None     WEIGHT BEARING RESTRICTIONS: No  PAIN:  Are you having pain? Yes: NPRS scale: 7/10 Pain location: Lt hand Pain description: constant, throbbing, sore Aggravating factors: nothing Relieving factors: medicine  FALLS: Has patient fallen in last 6 months? Yes. Number of falls 1  LIVING ENVIRONMENT: Lives with: lives alone Lives in: apartment on ground floor Has following equipment at home: Single point cane, Walker - 2 wheeled, shower chair, and bed side commode  PLOF:  has aide everyday M-Sun (assist in cleaning, cooking, and BADLS), on  disability  PATIENT GOALS: get my Lt hand better  NEXT MD VISIT: 03/02/23  OBJECTIVE:  Note: Objective measures were completed at Evaluation unless otherwise noted.  HAND DOMINANCE: Left  ADLs: Overall ADLs: needs assist (has had aide for 11 years)   FUNCTIONAL OUTCOME MEASURES: Quick Dash: 48% deficit (#2 and #6 were N/A as pt has not done for years therefore marked no difficulty)   UPPER EXTREMITY ROM:  BUE AROM WFL's w/ stiffness Lt long finger    Active ROM Right eval Left eval  Thumb MCP (0-60)    Thumb IP (0-80)    Thumb Radial abd/add (0-55)     Thumb Palmar abd/add (0-45)     Thumb Opposition to Small Finger     Index MCP (0-90)     Index PIP (0-100)     Index DIP (0-70)      Long MCP (0-90)   50   Long PIP (0-100)   90   Long DIP (0-70)      Ring  MCP (0-90)      Ring PIP (0-100)      Ring DIP (0-70)      Little MCP (0-90)      Little PIP (0-100)      Little DIP (0-70)      (Blank rows = not tested)   UPPER EXTREMITY MMT:   not tested - N/A  HAND FUNCTION: Grip strength: Right: 51 lbs; Left: 20 lbs, Lateral pinch: Right: 14 lbs, Left: 12 lbs, and 3 point pinch: Right: 10 lbs, Left: 7 lbs  COORDINATION: Limited by pain Lt hand. Pt trying to manipulate pen but difficulty d/t pain  SENSATION: DENIES CHANGE  EDEMA: Moderate Lt long finger  COGNITION: Overall cognitive status:  Pt reports she dropped out of school in 10th grade and difficulty with reading    OBSERVATIONS: Pt poor historian, low education/reading level, got confused with location of our suite. Pt also with generalized constant pain Lt hand and tightness at incision area, which is well healed but dense scar tissue present   TODAY'S TREATMENT:                                                                                                                               Ultrasound x 8 minutes, 3 Mhz, 0.8 wts/cm2, 20% pulsed over palm of Lt hand (over and around incision).  Pt w/ no adverse reactions and skin area was much softer and looser following ultrasound.   Pt shown scar massage and instructed to perform 2x/day for approx 5 mins. Pt also issued scar gel padding to wear at night w/ ace wrap to flatten scar, help with scar tissue management, and hopefully decrease pain at night  Initiated A/ROM HEP - see pt instructions for details.    Lt long finger MP flex = 55*, PIP flex = 90*   PATIENT EDUCATION: Education details: scar massage, A/ROM HEP  Person educated: Patient Education  method: Explanation, Demonstration, Verbal cues, and Handouts Education comprehension: verbalized understanding and returned demonstration  HOME EXERCISE PROGRAM: 03/20/23: scar massage, A/ROM HEP   GOALS: Goals reviewed with patient? Yes  SHORT TERM GOALS: Target date:  03/30/23  Independent with A/ROM HEP for Lt hand  Baseline: Tacey status: IN PROGRESS  2.  Pt independent with scar tissue massage and desensitization prn Baseline:  Jilek status: IN PROGRESS  3.  Improve Lt long finger ROM at MP and PIP joint by 10*  Baseline: MP 50*, PIP 90* Ritsema status: IN PROGRESS   LONG TERM GOALS: Target date: 04/30/23  Independent with strengthening HEP for grip and pinch strength Lt hand Baseline:  [place]street status: INITIAL  2.  Pain less than or equal to 5/10 with all daily tasks Baseline: 9/10 - constant Shima status: INITIAL  3.  Improve Lt grip strength to 35 lbs or greater to open jars/tight containers Baseline: 20 lbs Dehn status: INITIAL  4.  Improve Lt 3 tip pinch by 2 lbs or greater for pinching activities Baseline: 7 lbs Mcmath status: INITIAL  ASSESSMENT:  CLINICAL IMPRESSION: Patient is a 67 y.o. female who was seen today for occupational therapy treatment for Lt long finger trigger finger s/p release approx 2 months ago. Hx includes DM, HTN, asthma. Patient progressing towards all STG's. Pt would benefit from skilled OT services in the outpatient setting to work on impairments as noted below to help pt return to PLOF as able.   SABRA   PERFORMANCE DEFICITS: in functional skills including ADLs, coordination, edema, ROM, strength, pain, Fine motor control, skin integrity, and UE functional use  IMPAIRMENTS: are limiting patient from ADLs and social participation.   COMORBIDITIES: may have co-morbidities  that affects occupational performance. Patient will benefit from skilled OT to address above impairments and improve overall function.  MODIFICATION OR ASSISTANCE TO COMPLETE EVALUATION: No modification of tasks or assist necessary to complete an evaluation.  OT OCCUPATIONAL PROFILE AND HISTORY: Problem focused assessment: Including review of records relating to presenting problem.  CLINICAL DECISION MAKING: LOW - limited treatment options, no task  modification necessary  REHAB POTENTIAL: Good  EVALUATION COMPLEXITY: Low      PLAN:  OT FREQUENCY: 1x/week  OT DURATION: 8 weeks  PLANNED INTERVENTIONS: 97535 self care/ADL training, 02889 therapeutic exercise, 97530 therapeutic activity, 97112 neuromuscular re-education, 97140 manual therapy, 97035 ultrasound, 97018 paraffin, 02960 fluidotherapy, 97010 moist heat, 97010 cryotherapy, 97014 electrical stimulation unattended, 97760 Orthotics management and training, 02239 Splinting (initial encounter), scar mobilization, passive range of motion, patient/family education, and DME and/or AE instructions  RECOMMENDED OTHER SERVICES: none at this time  CONSULTED AND AGREED WITH PLAN OF CARE: Patient  PLAN FOR NEXT SESSION: continue pulsed US , review ROM HEP and add putty HEP, assess scar padding at night   Burnard JINNY Roads, OT 03/20/2023, 10:51 AM

## 2023-03-24 IMAGING — MG MM DIGITAL SCREENING BILAT W/ TOMO AND CAD
6 of 10 series · 6 of 30 positions shown · non-contrast
Comparison: Previous exam(s).

CLINICAL DATA: Screening.

EXAM:
DIGITAL SCREENING BILATERAL MAMMOGRAM WITH TOMOSYNTHESIS AND CAD
TECHNIQUE: Bilateral screening digital craniocaudal and mediolateral oblique
mammograms were obtained. Bilateral screening digital breast
tomosynthesis was performed. The images were evaluated with
computer-aided detection.

[L CC synth-2D (1 of 2)]
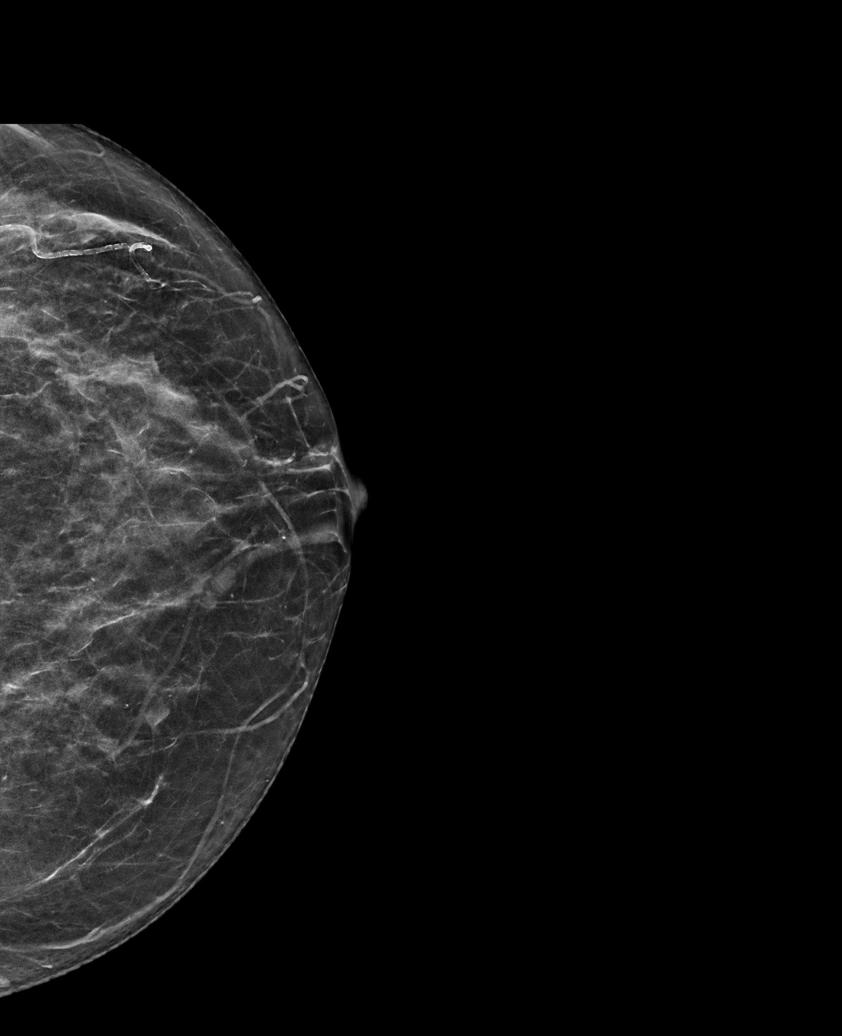

[L MLO synth-2D]
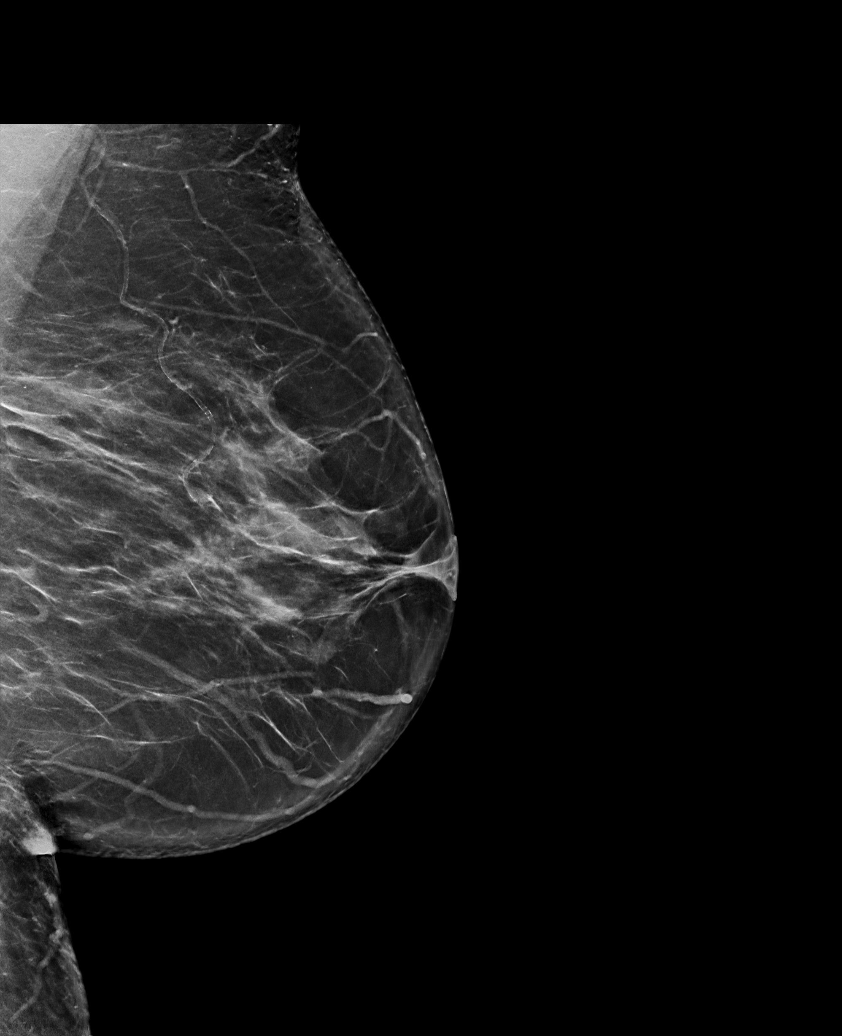

[L CC synth-2D (2 of 2)]
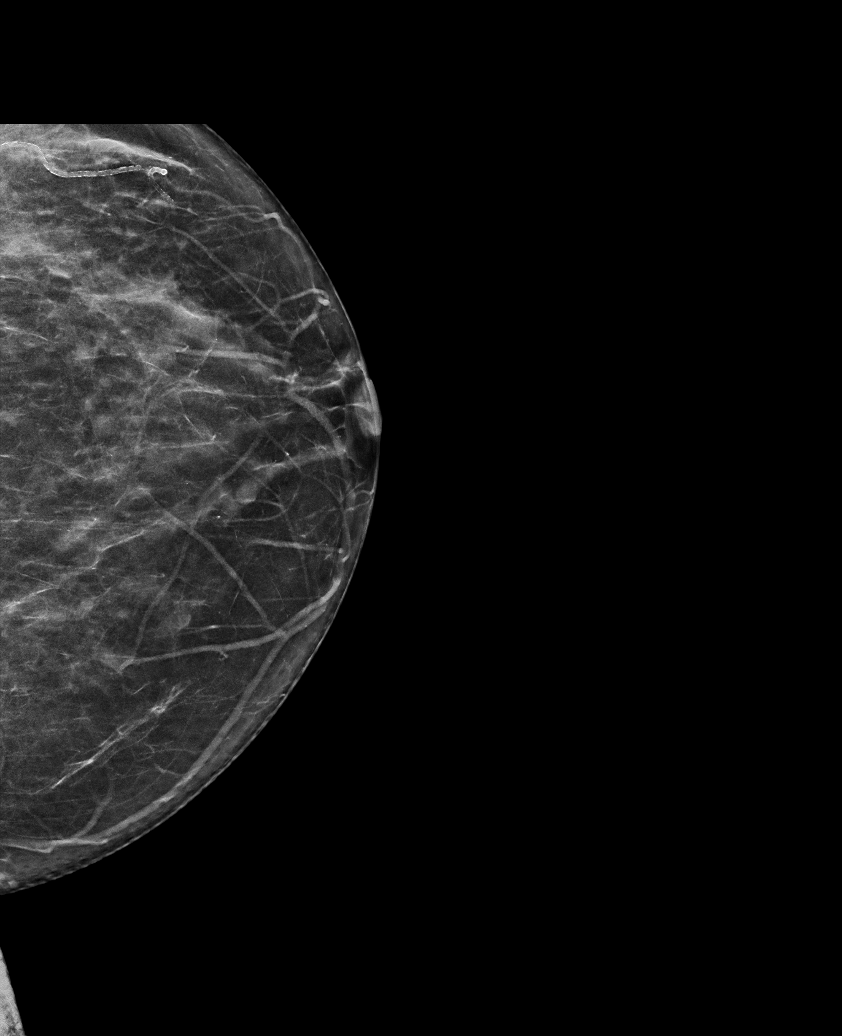

[R CC synth-2D]
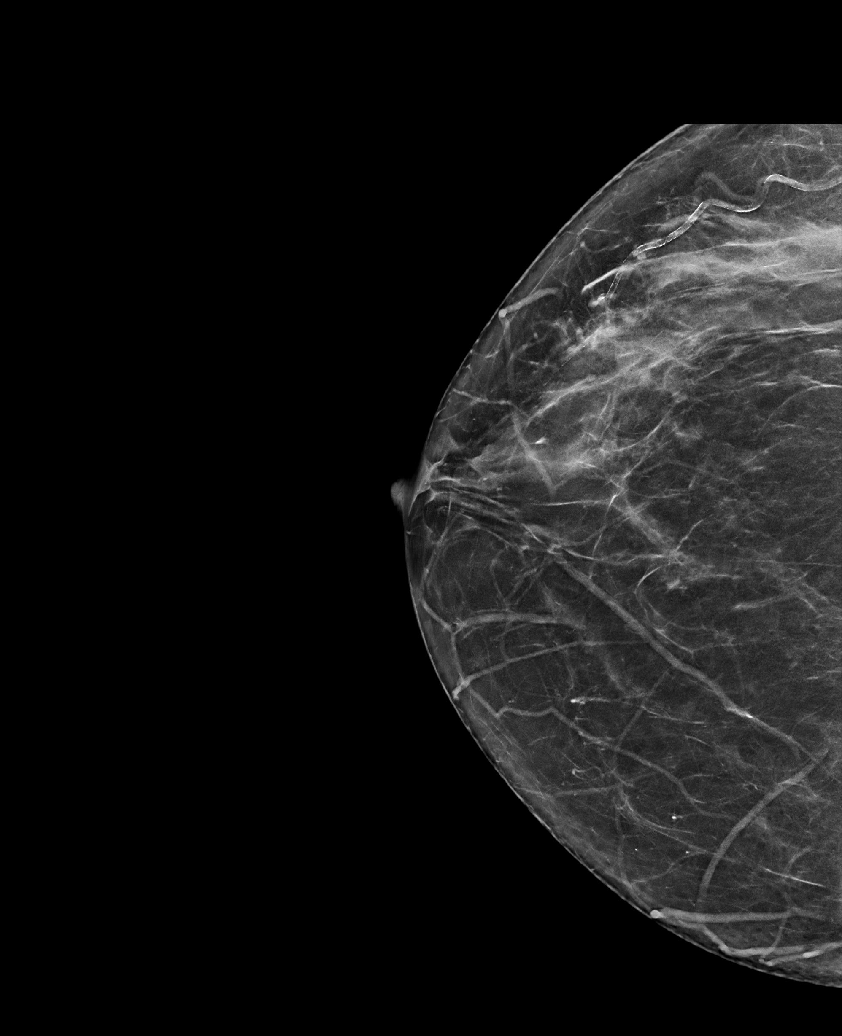

[R MLO synth-2D]
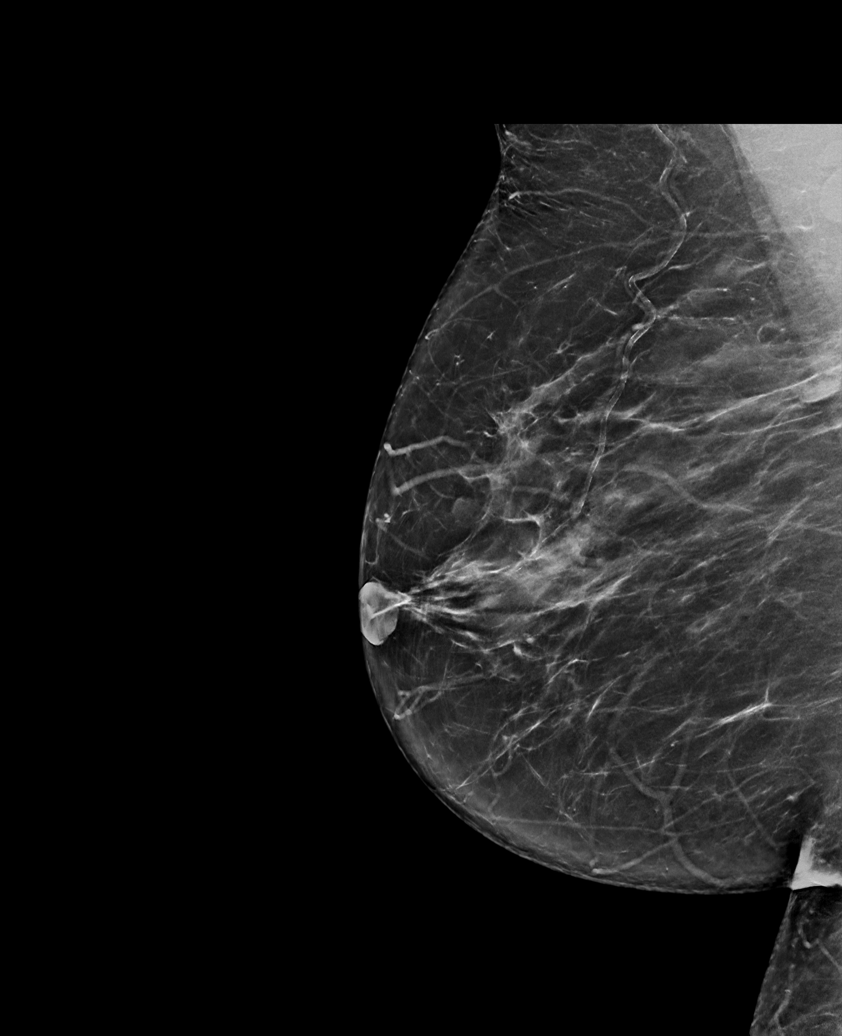

[R MLO tomo · tomo slice 43/84.0]
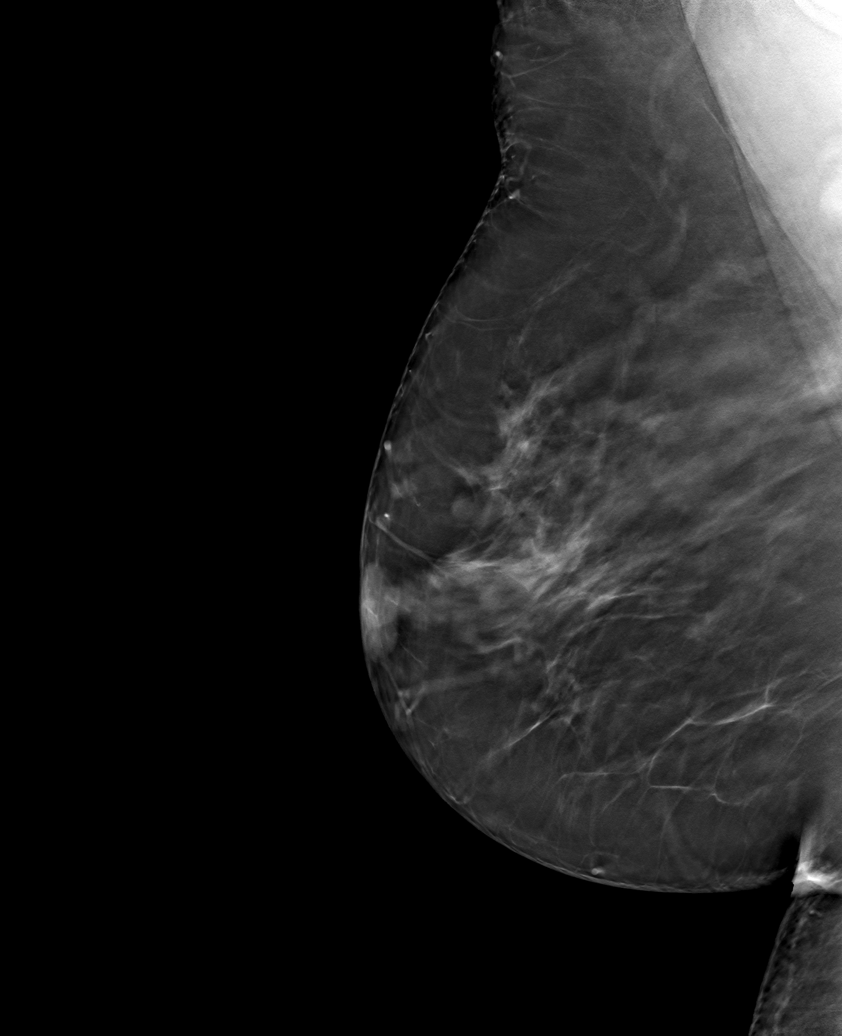

[6 of 30 positions shown; findings below may reference images not displayed]

ACR Breast Density Category c: The breast tissue is heterogeneously
dense, which may obscure small masses.
FINDINGS: There are no findings suspicious for malignancy.
IMPRESSION: No mammographic evidence of malignancy. A result letter of this
screening mammogram will be mailed directly to the patient.

RECOMMENDATION:
Screening mammogram in one year. (Code:Q3-W-BC3)

BI-RADS CATEGORY  1: Negative.

## 2023-03-27 ENCOUNTER — Ambulatory Visit: Payer: 59 | Admitting: Occupational Therapy

## 2023-03-28 DIAGNOSIS — J449 Chronic obstructive pulmonary disease, unspecified: Secondary | ICD-10-CM | POA: Diagnosis not present

## 2023-04-02 ENCOUNTER — Ambulatory Visit: Payer: 59 | Admitting: Podiatry

## 2023-04-03 ENCOUNTER — Ambulatory Visit: Payer: 59 | Admitting: Occupational Therapy

## 2023-04-04 ENCOUNTER — Ambulatory Visit (INDEPENDENT_AMBULATORY_CARE_PROVIDER_SITE_OTHER): Payer: 59 | Admitting: Family Medicine

## 2023-04-04 ENCOUNTER — Encounter: Payer: Self-pay | Admitting: Family Medicine

## 2023-04-04 VITALS — BP 132/98 | HR 85 | Temp 98.1°F | Wt 283.0 lb

## 2023-04-04 DIAGNOSIS — E278 Other specified disorders of adrenal gland: Secondary | ICD-10-CM

## 2023-04-04 DIAGNOSIS — R109 Unspecified abdominal pain: Secondary | ICD-10-CM

## 2023-04-04 DIAGNOSIS — R1031 Right lower quadrant pain: Secondary | ICD-10-CM | POA: Diagnosis not present

## 2023-04-04 LAB — POC URINALSYSI DIPSTICK (AUTOMATED)
Bilirubin, UA: NEGATIVE
Blood, UA: NEGATIVE
Glucose, UA: POSITIVE — AB
Ketones, UA: NEGATIVE
Leukocytes, UA: NEGATIVE
Nitrite, UA: NEGATIVE
Protein, UA: POSITIVE — AB
Spec Grav, UA: 1.02 (ref 1.010–1.025)
Urobilinogen, UA: 0.2 U/dL
pH, UA: 6 (ref 5.0–8.0)

## 2023-04-04 MED ORDER — HYDROCODONE-ACETAMINOPHEN 5-325 MG PO TABS: 1.0000 | ORAL_TABLET | Freq: Four times a day (QID) | ORAL | 0 refills | Status: DC | PRN

## 2023-04-04 NOTE — Progress Notes (Signed)
   Subjective:    Patient ID: [name], female    DOB: [birth year], 68 y.o.   MRN: [id number]  HPI Here for one week of an intermittent pain in the right flank. No recent trauma. No urinary urgency of burning. No blood in the urine. Her BM's have been regular. No fever or nausea. Of note she had right kidney stone removed with a basket extraction per Dr. Secundino Dach in 2016. In June of 2024 she had an upper GI bleed that was believed to have come from either a gastric polyp or a gastric telangectasia (both were found during an EGD). She was given 4 units of blood at that time. She says this pain is worse when she twists her trunk or when she bends over. She has been taking 1000 mg of Tylenol  for this, but this has not touched her pain. She says she has tried Tramadol  in the past for pain, but this "never works".    Review of Systems  Constitutional: Negative.   Respiratory: Negative.    Cardiovascular: Negative.   Gastrointestinal: Negative.   Genitourinary:  Positive for flank pain. Negative for dysuria, frequency, hematuria and urgency.       Objective:   Physical Exam Constitutional:      Comments: In pain, walks slowly   Cardiovascular:     Rate and Rhythm: Normal rate and regular rhythm.     Pulses: Normal pulses.     Heart sounds: Normal heart sounds.  Pulmonary:     Effort: Pulmonary effort is normal.     Breath sounds: Normal breath sounds.  Abdominal:     General: Abdomen is flat. Bowel sounds are normal. There is no distension.     Palpations: Abdomen is soft. There is no mass.     Tenderness: There is no abdominal tenderness. There is right CVA tenderness. There is no left CVA tenderness, guarding or rebound.     Hernia: No hernia is present.  Neurological:     Mental Status: She is alert.           Assessment & Plan:  She is having right flank pain that is suspicious for a right renal stone. We will get labs today and we will set up a contrasted CT of the  abdomen and pelvis ASAP. She can take Norco 5-325 as needed for pain (she is given #20). She will report any changes in her symptoms. We spent a total of ( 33  ) minutes reviewing records and discussing these issues.  Corita Diego, MD

## 2023-04-05 LAB — BASIC METABOLIC PANEL
BUN: 14 mg/dL (ref 6–23)
CO2: 25 meq/L (ref 19–32)
Calcium: 8.7 mg/dL (ref 8.4–10.5)
Chloride: 104 meq/L (ref 96–112)
Creatinine, Ser: 0.86 mg/dL (ref 0.40–1.20)
GFR: 69.7 mL/min (ref >60.00)
Glucose, Bld: 100 mg/dL — ABNORMAL HIGH (ref 70–99)
Potassium: 3.5 meq/L (ref 3.5–5.1)
Sodium: 140 meq/L (ref 135–145)

## 2023-04-05 LAB — HEPATIC FUNCTION PANEL
ALT: 17 U/L (ref 0–35)
AST: 16 U/L (ref 0–37)
Albumin: 4.4 g/dL (ref 3.5–5.2)
Alkaline Phosphatase: 76 U/L (ref 39–117)
Bilirubin, Direct: 0 mg/dL (ref 0.0–0.3)
Total Bilirubin: 0.4 mg/dL (ref 0.2–1.2)
Total Protein: 8.2 g/dL (ref 6.0–8.3)

## 2023-04-05 LAB — CBC WITH DIFFERENTIAL/PLATELET
Basophils Absolute: 0.1 10*3/uL (ref 0.0–0.1)
Basophils Relative: 1.1 % (ref 0.0–3.0)
Eosinophils Absolute: 0.1 10*3/uL (ref 0.0–0.7)
Eosinophils Relative: 2 % (ref 0.0–5.0)
HCT: 39 % (ref 36.0–46.0)
Hemoglobin: 12.3 g/dL (ref 12.0–15.0)
Lymphocytes Relative: 33.6 % (ref 12.0–46.0)
Lymphs Abs: 2.2 10*3/uL (ref 0.7–4.0)
MCHC: 31.6 g/dL (ref 30.0–36.0)
MCV: 83 fL (ref 78.0–100.0)
Monocytes Absolute: 0.6 10*3/uL (ref 0.1–1.0)
Monocytes Relative: 8.6 % (ref 3.0–12.0)
Neutro Abs: 3.5 10*3/uL (ref 1.4–7.7)
Neutrophils Relative %: 54.7 % (ref 43.0–77.0)
Platelets: 383 10*3/uL (ref 150.0–400.0)
RBC: 4.7 Mil/uL (ref 3.87–5.11)
RDW: 19.5 % — ABNORMAL HIGH (ref 11.5–15.5)
WBC: 6.4 10*3/uL (ref 4.0–10.5)

## 2023-04-09 ENCOUNTER — Inpatient Hospital Stay: Payer: 59 | Attending: Oncology

## 2023-04-09 ENCOUNTER — Encounter: Payer: Self-pay | Admitting: *Deleted

## 2023-04-10 ENCOUNTER — Ambulatory Visit: Payer: 59 | Attending: Orthopedic Surgery | Admitting: Occupational Therapy

## 2023-04-10 DIAGNOSIS — M25642 Stiffness of left hand, not elsewhere classified: Secondary | ICD-10-CM | POA: Insufficient documentation

## 2023-04-10 DIAGNOSIS — M6281 Muscle weakness (generalized): Secondary | ICD-10-CM | POA: Insufficient documentation

## 2023-04-10 DIAGNOSIS — R278 Other lack of coordination: Secondary | ICD-10-CM | POA: Insufficient documentation

## 2023-04-10 DIAGNOSIS — M79642 Pain in left hand: Secondary | ICD-10-CM | POA: Diagnosis not present

## 2023-04-10 NOTE — Therapy (Signed)
OUTPATIENT OCCUPATIONAL THERAPY ORTHO TREATMENT  Patient Name: [name] MRN: [id number] DOB:1956/01/11, 68 y.o., female Today's Date: 04/10/2023  PCP: [name], Md REFERRING PROVIDER: Samuella Cota, MD  END OF SESSION:  OT End of Session - 04/10/23 1108     Visit Number 3    Number of Visits 8    Date for OT Re-Evaluation 04/30/23    Authorization Type [employer] Dual complete - covered at 100%    OT Start Time 1020    OT Stop Time 1058    OT Time Calculation (min) 38 min    Activity Tolerance Patient tolerated treatment well    Behavior During Therapy WFL for tasks assessed/performed              Past Medical History:  Diagnosis Date   Adrenal mass (HCC) 03/226   Benign   Arthritis    knees/multiple orthopedic conditons; lower back   Asthma    per pt   Clotting disorder (HCC)    +beta-2-glycoprotein IgA antibody   COPD (chronic obstructive pulmonary disease) (HCC)    inhalers dependent on environment   Depression    Diabetes mellitus    120s usually fasting -  dx more than 10 yrs ago   Dizziness, nonspecific    DVT (deep venous thrombosis) (HCC)    Recurrent   Fibromyalgia    GERD (gastroesophageal reflux disease)    Heart murmur    History of blood transfusion    History of cocaine abuse (HCC)    Remote history    Hyperlipidemia    Hypertension    takes meds daily   Hypothyroidism    Lupus Anticoagulant Positive    On home oxygen therapy    at night   Shortness of breath    exertion or lying flat   Sleep apnea    2l of oxygen at night (as of 12/6, she used to)   Past Surgical History:  Procedure Laterality Date   ABDOMINAL HYSTERECTOMY  1989   Fibroids   ANTERIOR LUMBAR FUSION  01/03/2012   Procedure: ANTERIOR LUMBAR FUSION 1 LEVEL;  Surgeon: Carmela Hurt, MD;  Location: [facility];  Service: Neurosurgery;  Laterality: N/A;  Lumbar Four-Five Anterior Lumbar Interbody Fusion with Instrumentation   BACK SURGERY     cervical  spine---disk disease   CARPAL TUNNEL RELEASE     Bilateral   CESAREAN SECTION     CHOLECYSTECTOMY     CYSTOSCOPY/RETROGRADE/URETEROSCOPY/STONE EXTRACTION WITH BASKET Right 04/26/2014   Procedure: CYSTOSCOPY/ RIGHT RETROGRADE/ RIGHT URETEROSCOPY/URETERAL AND RENAL PELVIS BIOPSY;  Surgeon: Sebastian Ache, MD;  Location: [facility];  Service: Urology;  Laterality: Right;   ESOPHAGOGASTRODUODENOSCOPY (EGD) WITH PROPOFOL N/A 08/25/2022   Procedure: ESOPHAGOGASTRODUODENOSCOPY (EGD) WITH PROPOFOL;  Surgeon: Jeani Hawking, MD;  Location: [facility];  Service: Gastroenterology;  Laterality: N/A;   gallstones removed     HEMOSTASIS CLIP PLACEMENT  08/25/2022   Procedure: HEMOSTASIS CLIP PLACEMENT;  Surgeon: Jeani Hawking, MD;  Location: [facility];  Service: Gastroenterology;;   HOT HEMOSTASIS N/A 08/25/2022   Procedure: HOT HEMOSTASIS (ARGON PLASMA COAGULATION/BICAP);  Surgeon: Jeani Hawking, MD;  Location: [facility];  Service: Gastroenterology;  Laterality: N/A;   POLYPECTOMY  08/25/2022   Procedure: POLYPECTOMY;  Surgeon: Jeani Hawking, MD;  Location: [facility];  Service: Gastroenterology;;   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   bilateral   right elbow surgery     RIGHT HEART CATH N/A 02/28/2021   Procedure: RIGHT HEART CATH;  Surgeon:  Kathleene Hazel, MD;  Location: [facility];  Service: Cardiovascular;  Laterality: N/A;   THYROID LOBECTOMY Right 02/27/2017   THYROID LOBECTOMY Right 02/27/2017   Procedure: RIGHT THYROID LOBECTOMY;  Surgeon: Harriette Bouillon, MD;  Location: [facility];  Service: General;  Laterality: Right;   TUBAL LIGATION     Patient Active Problem List   Diagnosis Date Noted   Type 2 diabetes mellitus without complications (HCC) 03/05/2023   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   Chronic idiopathic constipation 03/05/2023   Gastro-esophageal reflux disease without esophagitis 03/05/2023   Iron deficiency anemia 03/05/2023   Screening for malignant neoplasm of colon  03/05/2023   Generalized anxiety disorder 10/19/2022   Major depressive disorder, recurrent, moderate (HCC) 10/19/2022   Anemia of chronic disease 08/23/2022   Left knee pain 08/23/2022   Mixed hyperlipidemia 10/07/2021   Paroxysmal SVT (supraventricular tachycardia) (HCC) 10/07/2021   Allergic rhinitis 11/29/2020   Allergic rhinitis due to pollen 11/29/2020   Moderate persistent asthma, uncomplicated 11/29/2020   Osteoarthritis of spine with radiculopathy, lumbar region 08/24/2020   Chronic back pain 08/18/2020   Obesity, diabetes, and hypertension syndrome (HCC) 02/27/2020   Vitreomacular adhesion of both eyes 02/09/2020   Pseudophakia of both eyes 02/09/2020   Chronic diastolic CHF (congestive heart failure) (HCC) 03/12/2019   Hypernatremia 03/09/2019   Suspected COVID-19 virus infection 03/09/2019   Thyroid mass 02/27/2017   Pain in right hip 05/24/2016   Effusion, right knee 04/28/2016   Presence of right artificial knee joint 04/28/2016   Abnormal LFTs    Decreased sensation    Paresthesia 06/12/2014   Obstruction of kidney 04/24/2014   Chronic anticoagulation 04/24/2014   Crack cocaine use 04/24/2014   Depression 04/24/2014   Fibromyalgia 04/24/2014   Obstructive sleep apnea on CPAP 04/24/2014   Lupus anticoagulant positive 04/24/2014   Adrenal mass, right (HCC) 04/24/2014   Right kidney mass 04/24/2014   Hydronephrosis of right kidney 04/24/2014   Supratherapeutic INR 04/24/2014   Renal mass 04/24/2014   Chronic obstructive pulmonary disease (HCC) 04/24/2014   Right lower quadrant abdominal pain    Morbid obesity (HCC) 08/21/2013   Constipation 08/19/2013   Lumbosacral spondylosis without myelopathy 11/19/2012   Morbid obesity with BMI of 45.0-49.9, adult (HCC) 01/05/2012   Bilateral deep vein thromboses (HCC) 12/13/2011   Hypokalemia 11/05/2011   Sciatica 11/05/2011   Essential hypertension 11/05/2011   Diabetes type 2, controlled (HCC) 11/05/2011    ONSET  DATE: 01/31/2023  REFERRING DIAG: R60.454 (ICD-10-CM) - Trigger finger, left middle finger Note:  S/P Left long finger trigger release Referral placed occupational therapy for range of motion exercises with transition to strengthening as appropriate. Can transition to home exercise program as well.  THERAPY DIAG:  Pain in left hand  Stiffness of left hand, not elsewhere classified  Muscle weakness (generalized)  Rationale for Evaluation and Treatment: Rehabilitation  SUBJECTIVE:   SUBJECTIVE STATEMENT: Pt reported working with affected hand. Pt reported dealing with new pain in back, which started about 3-4 weeks ago.  Pt ambulated with single point cane today.  Pt reported pain in back improves at rest and when seated. Pt reported discomfort at low back is worse when standing and walking. Pt reported upcoming CT scan on 04/27/23 to address back pain.  Pt denied recent falls.  Pt reported not taking pain meds today because "it makes me sleepy/drowsy."  Pt accompanied by: self  PERTINENT HISTORY: DM, Asthma, bronchitis, HTN  PRECAUTIONS: None  WEIGHT BEARING RESTRICTIONS: No  PAIN:  Are you having pain? Yes: NPRS scale: 4/10 Lt hand pain, 8-9/10 low back pain Pain location: Lt hand and low back Pain description: constant, throbbing, sore Aggravating factors: walking and standing for low back pain Relieving factors: medicine, seated rest for low back pain  FALLS: Has patient fallen in last 6 months? Yes. Number of falls 1  LIVING ENVIRONMENT: Lives with: lives alone Lives in: apartment on ground floor Has following equipment at home: Single point cane, Walker - 2 wheeled, shower chair, and bed side commode  PLOF:  has aide everyday [employer] (assist in cleaning, cooking, and BADLS), on disability  PATIENT GOALS: get my Lt hand better  NEXT MD VISIT: 03/02/23  OBJECTIVE:  Note: Objective measures were completed at Evaluation unless otherwise noted.  HAND  DOMINANCE: Left  ADLs: Overall ADLs: needs assist (has had aide for 11 years)   FUNCTIONAL OUTCOME MEASURES: Quick Dash: 48% deficit (#2 and #6 were N/A as pt has not done for years therefore marked no difficulty)   UPPER EXTREMITY ROM:  BUE AROM WFL's w/ stiffness Lt long finger    Active ROM Right eval Left eval  Thumb MCP (0-60)    Thumb IP (0-80)    Thumb Radial abd/add (0-55)     Thumb Palmar abd/add (0-45)     Thumb Opposition to Small Finger     Index MCP (0-90)     Index PIP (0-100)     Index DIP (0-70)      Long MCP (0-90)   50   Long PIP (0-100)   90   Long DIP (0-70)      Ring MCP (0-90)      Ring PIP (0-100)      Ring DIP (0-70)      Little MCP (0-90)      Little PIP (0-100)      Little DIP (0-70)      (Blank rows = not tested)   UPPER EXTREMITY MMT:   not tested - N/A  HAND FUNCTION: Grip strength: Right: 51 lbs; Left: 20 lbs, Lateral pinch: Right: 14 lbs, Left: 12 lbs, and 3 point pinch: Right: 10 lbs, Left: 7 lbs  COORDINATION: Limited by pain Lt hand. Pt trying to manipulate pen but difficulty d/t pain  SENSATION: DENIES CHANGE  EDEMA: Moderate Lt long finger  COGNITION: Overall cognitive status:  Pt reports she dropped out of school in 10th grade and difficulty with reading    OBSERVATIONS: Pt poor historian, low education/reading level, got confused with location of our suite. Pt also with generalized constant pain Lt hand and tightness at incision area, which is well healed but dense scar tissue present   TODAY'S TREATMENT:                                                                                                                               TherAct OT assessed pt's progress  towards goals, see below for updates.   OT reviewed previously-issued HEP. Pt returned demonstration with mod v/c. OT educated pt on joint protection during scar massage. Pt verbalized understanding and returned demonstration.  OT initiated yellow theraputty  HEP - to improve affected UE strengthening and FM coordination. OT emphasized importance of completing reps/sets as prescribed to avoid overuse of and injury to affected hand. Pt verbalized understanding. Pt returned demonstration of exercises: Access Code: [email or web address] URL: [email or web address] Date: 04/10/2023 Prepared by: Carilyn Goodpasture  Exercises - Putty Squeezes  - 2 x daily - 1 sets - 10 reps - Rolling Putty on Table  - 2 x daily - 1 sets - 10 reps - Tip Pinch with Putty  - 2 x daily - 1 sets - 10 reps - Seated Three Finger Pinch with Putty  - 2 x daily - 1 sets - 10 reps - Finger Extension with Putty  - 2 x daily - 1 sets - 10 reps  Pt denied pain with all exercises. Pt benefited from mod v/c and therapist modeling to complete exercises.  Manual OT completed STM scar massage to affected UE scar site. OT noted scar appeared well healed with mild to moderate tightness of scar noted.   PATIENT EDUCATION: Education details: see today's treatment above Person educated: Patient Education method: Explanation, Demonstration, Verbal cues, and Handouts Education comprehension: verbalized understanding and returned demonstration  HOME EXERCISE PROGRAM: 03/20/23: scar massage, A/ROM HEP  04/10/23 - additional copy of 03/20/23 HEP (see pt instructions), Yellow theraputty HEP - Access Code: [email or web address]  GOALS: Goals reviewed with patient? Yes  SHORT TERM GOALS: Target date: 03/30/23  Independent with A/ROM HEP for Lt hand  Baseline: 04/10/23 - OT reviewed AROM HEP. Pt returned demonstration with mod v/c. OT provided additional copy of exercises to pt. [place] status: IN PROGRESS  2.  Pt independent with scar tissue massage and desensitization prn Baseline:  04/10/23 - OT reviewed scar tissue massage and recommended using 2 fingers or thumb of opposite hand for joint protection. Pt returned demonstration. Pt reported wearing scar pad at night, most nights though sometimes  forgets. [place] status: IN PROGRESS  3.  Improve Lt long finger ROM at MP and PIP joint by 10*  Baseline: MP 50*, PIP 90* 04/10/23 - MP 66*, PIP 90* (met MP [place], maintained PIP AROM) - composite fist AROM WFL [place] status: IN PROGRESS   LONG TERM GOALS: Target date: 04/30/23  Independent with strengthening HEP for grip and pinch strength Lt hand Baseline:  04/10/23 - OT issued yellow theraputty HEP to improve strengthening and FM coordination of affected UE. Pt returned demonstration following v/c and therapist modeling. [place] status: in progress  2.  Pain less than or equal to 5/10 with all daily tasks Baseline: 9/10 - constant 04/10/23 - Pt reported 4/10 pain level of Lt hand today. [place] status: in progress  3.  Improve Lt grip strength to 35 lbs or greater to open jars/tight containers Baseline: 20 lbs [place] status: in progress  4.  Improve Lt 3 tip pinch by 2 lbs or greater for pinching activities Baseline: 7 lbs [place] status: in progress  ASSESSMENT:  CLINICAL IMPRESSION: Pt tolerated tasks well, including updated theraputty HEP. Pt denied LUE pain with all exercises. Pt benefited from v/c and therapist modeling to improve understanding of HEP. Pt progressing towards goals though some delays in progress likely d/t recently missed OT visits, (pt not seen since 03/20/23). Pt benefited from review of education and exercises today.  Pt would benefit from skilled OT services in the outpatient setting to work on impairments as noted below to help pt return to PLOF as able.    PERFORMANCE DEFICITS: in functional skills including ADLs, coordination, edema, ROM, strength, pain, Fine motor control, skin integrity, and UE functional use  IMPAIRMENTS: are limiting patient from ADLs and social participation.   COMORBIDITIES: may have co-morbidities  that affects occupational performance. Patient will benefit from skilled OT to address above impairments and improve overall  function.  MODIFICATION OR ASSISTANCE TO COMPLETE EVALUATION: No modification of tasks or assist necessary to complete an evaluation.  OT OCCUPATIONAL PROFILE AND HISTORY: Problem focused assessment: Including review of records relating to presenting problem.  CLINICAL DECISION MAKING: LOW - limited treatment options, no task modification necessary  REHAB POTENTIAL: Good  EVALUATION COMPLEXITY: Low      PLAN:  OT FREQUENCY: 1x/week  OT DURATION: 8 weeks  PLANNED INTERVENTIONS: 97535 self care/ADL training, 81191 therapeutic exercise, 97530 therapeutic activity, 97112 neuromuscular re-education, 97140 manual therapy, 97035 ultrasound, 97018 paraffin, 47829 fluidotherapy, 97010 moist heat, 97010 cryotherapy, 97014 electrical stimulation unattended, 97760 Orthotics management and training, 56213 Splinting (initial encounter), scar mobilization, passive range of motion, patient/family education, and DME and/or AE instructions  RECOMMENDED OTHER SERVICES: none at this time  CONSULTED AND AGREED WITH PLAN OF CARE: Patient  PLAN FOR NEXT SESSION:  Assess grip and pinch strength Scar tissue management continue pulsed [place], review ROM HEP and add putty HEP, assess scar padding at night   Wynetta Emery, OT 04/10/2023, 11:10 AM

## 2023-04-11 ENCOUNTER — Encounter (HOSPITAL_BASED_OUTPATIENT_CLINIC_OR_DEPARTMENT_OTHER): Payer: 59 | Admitting: Internal Medicine

## 2023-04-16 ENCOUNTER — Encounter: Payer: 59 | Admitting: Orthopedic Surgery

## 2023-04-17 ENCOUNTER — Other Ambulatory Visit: Payer: Self-pay | Admitting: Family Medicine

## 2023-04-17 ENCOUNTER — Ambulatory Visit: Payer: 59 | Admitting: Occupational Therapy

## 2023-04-17 ENCOUNTER — Encounter: Payer: Self-pay | Admitting: Occupational Therapy

## 2023-04-17 ENCOUNTER — Ambulatory Visit: Payer: Self-pay | Admitting: Family Medicine

## 2023-04-17 DIAGNOSIS — M25642 Stiffness of left hand, not elsewhere classified: Secondary | ICD-10-CM

## 2023-04-17 DIAGNOSIS — M79642 Pain in left hand: Secondary | ICD-10-CM | POA: Diagnosis not present

## 2023-04-17 DIAGNOSIS — R278 Other lack of coordination: Secondary | ICD-10-CM | POA: Diagnosis not present

## 2023-04-17 DIAGNOSIS — M6281 Muscle weakness (generalized): Secondary | ICD-10-CM

## 2023-04-17 NOTE — Telephone Encounter (Signed)
Sent by accident

## 2023-04-17 NOTE — Telephone Encounter (Signed)
Please advise they tried to get pt to come in pt declined. Stated she did not have a ride due to not being able to drive.

## 2023-04-17 NOTE — Telephone Encounter (Signed)
Copied from CRM [id number]. Topic: Clinical - Red Word Triage >> Apr 17, 2023  1:10 PM Chantha C wrote: [facility] that prompted transfer to Nurse Triage: Patient [id number] needs consult on blood pressure 95/65 is low today. Patient is having light headed, feels like about to pass out, tired, and weak. Patient denies shortness of breath, or pain.   Chief Complaint: low bp 95/65 Symptoms: lightheaded Frequency: ongoing since yesterday Disposition: [] ED /[] Urgent Care (no appt availability in office) / [x] Appointment(In office/virtual)/ []  [facility]/ [] Home Care/ [] Refused Recommended Disposition /[] [facility]/ []  Follow-up with PCP Additional Notes: The patient reported that she was feeling tired and lightheaded yesterday but she "pushed through."  Today she went to an appointment and her blood pressure was 95/65.  She had not taken her blood pressure medicine today. She reported ongoing lightheadedness that resolves when laying down. She declined a same day office appointment as she does not have a ride and it would be unsafe for her to drive herself.  Routed to pcp to further advise regarding medication.  Reason for Disposition  [1] Systolic BP 90-110 AND [2] taking blood pressure medications AND [3] dizzy, lightheaded or weak  Answer Assessment - Initial Assessment Questions 1. BLOOD PRESSURE: "What is the blood pressure?" "Did you take at least two measurements 5 minutes apart?"     95/65 2. ONSET: "When did you take your blood pressure?"     Today  3. HOW: "How did you obtain the blood pressure?" (e.g., visiting nurse, automatic home BP monitor)     Manual  4. HISTORY: "Do you have a history of low blood pressure?" "What is your blood pressure normally?"     Hypertension  5. MEDICINES: "Are you taking any medications for blood pressure?" If Yes, ask: "Have they been changed recently?"     Clonidine  6. PULSE RATE: "Do you know what your pulse rate is?"       93 7. OTHER SYMPTOMS: "Have you been sick recently?" "Have you had a recent injury?"     Feeling lightheaded when she stands  Protocols used: Blood Pressure - Low-A-AH

## 2023-04-17 NOTE — Therapy (Signed)
OUTPATIENT OCCUPATIONAL THERAPY ORTHO TREATMENT  Patient Name: [name] MRN: [id number] DOB:03-18-1956, 68 y.o., female Today's Date: 04/17/2023  PCP: [name], Md REFERRING PROVIDER: Samuella Cota, MD  END OF SESSION:  OT End of Session - 04/17/23 2440     Visit Number 4    Number of Visits 8    Date for OT Re-Evaluation 04/30/23    Authorization Type [employer] Dual complete - covered at 100%    OT Start Time 0934    OT Stop Time 1015    OT Time Calculation (min) 41 min    Activity Tolerance Patient tolerated treatment well    Behavior During Therapy [facility] for tasks assessed/performed              Past Medical History:  Diagnosis Date   Adrenal mass (HCC) 03/226   Benign   Arthritis    knees/multiple orthopedic conditons; lower back   Asthma    per pt   Clotting disorder (HCC)    +beta-2-glycoprotein IgA antibody   COPD (chronic obstructive pulmonary disease) (HCC)    inhalers dependent on environment   Depression    Diabetes mellitus    120s usually fasting -  dx more than 10 yrs ago   Dizziness, nonspecific    DVT (deep venous thrombosis) (HCC)    Recurrent   Fibromyalgia    GERD (gastroesophageal reflux disease)    Heart murmur    History of blood transfusion    History of cocaine abuse (HCC)    Remote history    Hyperlipidemia    Hypertension    takes meds daily   Hypothyroidism    Lupus Anticoagulant Positive    On home oxygen therapy    at night   Shortness of breath    exertion or lying flat   Sleep apnea    2l of oxygen at night (as of 12/6, she used to)   Past Surgical History:  Procedure Laterality Date   ABDOMINAL HYSTERECTOMY  1989   Fibroids   ANTERIOR LUMBAR FUSION  01/03/2012   Procedure: ANTERIOR LUMBAR FUSION 1 LEVEL;  Surgeon: Carmela Hurt, MD;  Location: [facility];  Service: Neurosurgery;  Laterality: N/A;  Lumbar Four-Five Anterior Lumbar Interbody Fusion with Instrumentation   BACK SURGERY     cervical  spine---disk disease   CARPAL TUNNEL RELEASE     Bilateral   CESAREAN SECTION     CHOLECYSTECTOMY     CYSTOSCOPY/RETROGRADE/URETEROSCOPY/STONE EXTRACTION WITH BASKET Right 04/26/2014   Procedure: CYSTOSCOPY/ RIGHT RETROGRADE/ RIGHT URETEROSCOPY/URETERAL AND RENAL PELVIS BIOPSY;  Surgeon: Sebastian Ache, MD;  Location: [facility];  Service: Urology;  Laterality: Right;   ESOPHAGOGASTRODUODENOSCOPY (EGD) WITH PROPOFOL N/A 08/25/2022   Procedure: ESOPHAGOGASTRODUODENOSCOPY (EGD) WITH PROPOFOL;  Surgeon: Jeani Hawking, MD;  Location: [facility];  Service: Gastroenterology;  Laterality: N/A;   gallstones removed     HEMOSTASIS CLIP PLACEMENT  08/25/2022   Procedure: HEMOSTASIS CLIP PLACEMENT;  Surgeon: Jeani Hawking, MD;  Location: [facility];  Service: Gastroenterology;;   HOT HEMOSTASIS N/A 08/25/2022   Procedure: HOT HEMOSTASIS (ARGON PLASMA COAGULATION/BICAP);  Surgeon: Jeani Hawking, MD;  Location: [facility];  Service: Gastroenterology;  Laterality: N/A;   POLYPECTOMY  08/25/2022   Procedure: POLYPECTOMY;  Surgeon: Jeani Hawking, MD;  Location: [facility];  Service: Gastroenterology;;   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   bilateral   right elbow surgery     RIGHT HEART CATH N/A 02/28/2021   Procedure: RIGHT HEART CATH;  Surgeon:  Kathleene Hazel, MD;  Location: [facility];  Service: Cardiovascular;  Laterality: N/A;   THYROID LOBECTOMY Right 02/27/2017   THYROID LOBECTOMY Right 02/27/2017   Procedure: RIGHT THYROID LOBECTOMY;  Surgeon: Harriette Bouillon, MD;  Location: [facility];  Service: General;  Laterality: Right;   TUBAL LIGATION     Patient Active Problem List   Diagnosis Date Noted   Type 2 diabetes mellitus without complications (HCC) 03/05/2023   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   Chronic idiopathic constipation 03/05/2023   Gastro-esophageal reflux disease without esophagitis 03/05/2023   Iron deficiency anemia 03/05/2023   Screening for malignant neoplasm of colon  03/05/2023   Generalized anxiety disorder 10/19/2022   Major depressive disorder, recurrent, moderate (HCC) 10/19/2022   Anemia of chronic disease 08/23/2022   Left knee pain 08/23/2022   Mixed hyperlipidemia 10/07/2021   Paroxysmal SVT (supraventricular tachycardia) (HCC) 10/07/2021   Allergic rhinitis 11/29/2020   Allergic rhinitis due to pollen 11/29/2020   Moderate persistent asthma, uncomplicated 11/29/2020   Osteoarthritis of spine with radiculopathy, lumbar region 08/24/2020   Chronic back pain 08/18/2020   Obesity, diabetes, and hypertension syndrome (HCC) 02/27/2020   Vitreomacular adhesion of both eyes 02/09/2020   Pseudophakia of both eyes 02/09/2020   Chronic diastolic CHF (congestive heart failure) (HCC) 03/12/2019   Hypernatremia 03/09/2019   Suspected COVID-19 virus infection 03/09/2019   Thyroid mass 02/27/2017   Pain in right hip 05/24/2016   Effusion, right knee 04/28/2016   Presence of right artificial knee joint 04/28/2016   Abnormal LFTs    Decreased sensation    Paresthesia 06/12/2014   Obstruction of kidney 04/24/2014   Chronic anticoagulation 04/24/2014   Crack cocaine use 04/24/2014   Depression 04/24/2014   Fibromyalgia 04/24/2014   Obstructive sleep apnea on CPAP 04/24/2014   Lupus anticoagulant positive 04/24/2014   Adrenal mass, right (HCC) 04/24/2014   Right kidney mass 04/24/2014   Hydronephrosis of right kidney 04/24/2014   Supratherapeutic INR 04/24/2014   Renal mass 04/24/2014   Chronic obstructive pulmonary disease (HCC) 04/24/2014   Right lower quadrant abdominal pain    Morbid obesity (HCC) 08/21/2013   Constipation 08/19/2013   Lumbosacral spondylosis without myelopathy 11/19/2012   Morbid obesity with BMI of 45.0-49.9, adult (HCC) 01/05/2012   Bilateral deep vein thromboses (HCC) 12/13/2011   Hypokalemia 11/05/2011   Sciatica 11/05/2011   Essential hypertension 11/05/2011   Diabetes type 2, controlled (HCC) 11/05/2011    ONSET  DATE: 01/31/2023  REFERRING DIAG: Z61.096 (ICD-10-CM) - Trigger finger, left middle finger Note:  S/P Left long finger trigger release Referral placed occupational therapy for range of motion exercises with transition to strengthening as appropriate. Can transition to home exercise program as well.  THERAPY DIAG:  Pain in left hand  Stiffness of left hand, not elsewhere classified  Muscle weakness (generalized)  Other lack of coordination  Rationale for Evaluation and Treatment: Rehabilitation  SUBJECTIVE:   SUBJECTIVE STATEMENT: I feel dizzy this morning. It's a rough morning. I almost fell twice this morning  Pt ambulated with single point cane today.   Pt reported upcoming CT scan on 04/27/23 to address back pain.  Pt denied recent falls.  Pt accompanied by: self  PERTINENT HISTORY: DM, Asthma, bronchitis, HTN  PRECAUTIONS: None     WEIGHT BEARING RESTRICTIONS: No  PAIN:  Are you having pain? Yes: NPRS scale: 1/10 Lt hand pain, 7-8/10 low back pain Pain location: Lt hand and low back Pain description: constant, throbbing, sore Aggravating  factors: walking and standing for low back pain Relieving factors: medicine, seated rest for low back pain  FALLS: Has patient fallen in last 6 months? Yes. Number of falls 1  LIVING ENVIRONMENT: Lives with: lives alone Lives in: apartment on ground floor Has following equipment at home: Single point cane, Walker - 2 wheeled, shower chair, and bed side commode  PLOF:  has aide everyday M-Sun (assist in cleaning, cooking, and BADLS), on disability  PATIENT GOALS: get my Lt hand better  NEXT MD VISIT: 03/02/23  OBJECTIVE:  Note: Objective measures were completed at Evaluation unless otherwise noted.  HAND DOMINANCE: Left  ADLs: Overall ADLs: needs assist (has had aide for 11 years)   FUNCTIONAL OUTCOME MEASURES: Quick Dash: 48% deficit (#2 and #6 were N/A as pt has not done for years therefore marked no difficulty)    UPPER EXTREMITY ROM:  BUE AROM WFL's w/ stiffness Lt long finger    Active ROM Right eval Left eval  Thumb MCP (0-60)    Thumb IP (0-80)    Thumb Radial abd/add (0-55)     Thumb Palmar abd/add (0-45)     Thumb Opposition to Small Finger     Index MCP (0-90)     Index PIP (0-100)     Index DIP (0-70)      Long MCP (0-90)   50   Long PIP (0-100)   90   Long DIP (0-70)      Ring MCP (0-90)      Ring PIP (0-100)      Ring DIP (0-70)      Little MCP (0-90)      Little PIP (0-100)      Little DIP (0-70)      (Blank rows = not tested)   UPPER EXTREMITY MMT:   not tested - N/A  HAND FUNCTION: Grip strength: Right: 51 lbs; Left: 20 lbs, Lateral pinch: Right: 14 lbs, Left: 12 lbs, and 3 point pinch: Right: 10 lbs, Left: 7 lbs 04/17/23: Lt grip = 38 lbs, Lateral pinch = 17 lbs, 3 tip pinch = 10 lbs  COORDINATION: Limited by pain Lt hand. Pt trying to manipulate pen but difficulty d/t pain  SENSATION: DENIES CHANGE  EDEMA: Moderate Lt long finger  COGNITION: Overall cognitive status:  Pt reports she dropped out of school in 10th grade and difficulty with reading    OBSERVATIONS: Pt poor historian, low education/reading level, got confused with location of our suite. Pt also with generalized constant pain Lt hand and tightness at incision area, which is well healed but dense scar tissue present   TODAY'S TREATMENT:                                                                                                                               BP = 95/65, HR = 93. Pt reports she feels dizzy and did not eat breakfast this morning. Pt instructed to eat breakfast  immediately after today's O.T. session  OT reviewed yellow theraputty HEP issued last session - to improve affected UE strengthening and FM coordination. OT emphasized importance of completing reps/sets as prescribed to avoid overuse of and injury to affected hand. Pt verbalized understanding. Pt returned demonstration of  exercises: Access Code: [email or web address] URL: [email or web address]  Exercises - Putty Squeezes  - 2 x daily - 1 sets - 10 reps - Rolling Putty on Table  - 2 x daily - 1 sets - 10 reps - Tip Pinch with Putty  - 2 x daily - 1 sets - 10 reps - Seated Three Finger Pinch with Putty  - 2 x daily - 1 sets - 10 reps - Finger Extension with Putty  - 2 x daily - 1 sets - 10 reps   *Re-assessed grip and pinch strength - see above objective measures. Grip and both pinches improved.  Also assessed STG's and progress towards LTG's - see below   PATIENT EDUCATION: Education details: see today's treatment above Person educated: Patient Education method: Explanation, Demonstration, Verbal cues, and Handouts Education comprehension: verbalized understanding and returned demonstration  HOME EXERCISE PROGRAM: 03/20/23: scar massage, A/ROM HEP  04/10/23 - additional copy of 03/20/23 HEP (see pt instructions), Yellow theraputty HEP - Access Code: [email or web address]  GOALS: Goals reviewed with patient? Yes  SHORT TERM GOALS: Target date: 03/30/23  Independent with A/ROM HEP for Lt hand  Baseline: Bozman status: MET  2.  Pt independent with scar tissue massage and desensitization prn Baseline:  04/10/23 - OT reviewed scar tissue massage and recommended using 2 fingers or thumb of opposite hand for joint protection. Pt returned demonstration. Pt reported wearing scar pad at night, most nights though sometimes forgets. Service status: MET   3.  Improve Lt long finger ROM at MP and PIP joint by 10*  Baseline: MP 50*, PIP 90* 04/10/23 - MP 66*, PIP 90* (met MP Diesel, maintained PIP AROM) - composite fist AROM [facility] 04/17/23 - PIP 95* [place]ahn status: IN PROGRESS   LONG TERM GOALS: Target date: 04/30/23  Independent with strengthening HEP for grip and pinch strength Lt hand Baseline:  04/10/23 - OT issued yellow theraputty HEP to improve strengthening and FM coordination of affected UE. Pt returned demonstration  following v/c and therapist modeling. Deuser status: in progress  2.  Pain less than or equal to 5/10 with all daily tasks Baseline: 9/10 - constant 04/10/23 - Pt reported 4/10 pain level of Lt hand today. Duan status: MET (0-5/10)  3.  Improve Lt grip strength to 35 lbs or greater to open jars/tight containers Baseline: 20 lbs [place]vi status: MET (04/17/23 = 38 LBS)   4.  Improve Lt 3 tip pinch by 2 lbs or greater for pinching activities Baseline: 7 lbs Pearlman status: MET (04/17/23: 10 LBS)   ASSESSMENT:  CLINICAL IMPRESSION: Pt tolerated tasks well, including updated theraputty HEP. Pt denied LUE pain with all exercises. Pt has met 2/3 STG's and 3/4 LTG's at this time. Pt benefited from review of putty exercises today. Pt would benefit from skilled OT services in the outpatient setting to work on impairments as noted below to help pt return to PLOF as able.    PERFORMANCE DEFICITS: in functional skills including ADLs, coordination, edema, ROM, strength, pain, Fine motor control, skin integrity, and UE functional use  IMPAIRMENTS: are limiting patient from ADLs and social participation.   COMORBIDITIES: may have co-morbidities  that affects occupational performance. Patient will benefit from skilled OT to address  above impairments and improve overall function.  MODIFICATION OR ASSISTANCE TO COMPLETE EVALUATION: No modification of tasks or assist necessary to complete an evaluation.  OT OCCUPATIONAL PROFILE AND HISTORY: Problem focused assessment: Including review of records relating to presenting problem.  CLINICAL DECISION MAKING: LOW - limited treatment options, no task modification necessary  REHAB POTENTIAL: Good  EVALUATION COMPLEXITY: Low      PLAN:  OT FREQUENCY: 1x/week  OT DURATION: 8 weeks  PLANNED INTERVENTIONS: 97535 self care/ADL training, 16109 therapeutic exercise, 97530 therapeutic activity, 97112 neuromuscular re-education, 97140 manual therapy, 97035  ultrasound, 97018 paraffin, 60454 fluidotherapy, 97010 moist heat, 97010 cryotherapy, 97014 electrical stimulation unattended, 97760 Orthotics management and training, 09811 Splinting (initial encounter), scar mobilization, passive range of motion, patient/family education, and DME and/or AE instructions  RECOMMENDED OTHER SERVICES: none at this time  CONSULTED AND AGREED WITH PLAN OF CARE: Patient  PLAN FOR NEXT SESSION:  Continue light strengthening and functional tasks, assess remaining goals, and  d/c next session   Sheran Lawless, OT 04/17/2023, 9:37 AM

## 2023-04-18 ENCOUNTER — Inpatient Hospital Stay: Payer: 59 | Attending: Oncology

## 2023-04-18 ENCOUNTER — Telehealth: Payer: Self-pay | Admitting: *Deleted

## 2023-04-18 DIAGNOSIS — Z7901 Long term (current) use of anticoagulants: Secondary | ICD-10-CM | POA: Diagnosis not present

## 2023-04-18 DIAGNOSIS — Z86718 Personal history of other venous thrombosis and embolism: Secondary | ICD-10-CM | POA: Diagnosis not present

## 2023-04-18 LAB — PROTIME-INR
INR: 1.3 — ABNORMAL HIGH (ref 0.8–1.2)
Prothrombin Time: 16.2 s — ABNORMAL HIGH (ref 11.4–15.2)

## 2023-04-18 NOTE — Telephone Encounter (Signed)
Notified [name] that INR is still subtherapeutic at 1.3. She reports not missing any warfarin dose and still on 7.5 mg every day, except 10 mg on MWF. Not eating greens now and no new med except hydrocodone prn for her back.  Per Dr. Truett Perna: continue same dose warfarin and recheck in 2 weeks

## 2023-04-19 ENCOUNTER — Ambulatory Visit: Payer: 59 | Admitting: Orthopedic Surgery

## 2023-04-19 DIAGNOSIS — Z9889 Other specified postprocedural states: Secondary | ICD-10-CM

## 2023-04-19 NOTE — Progress Notes (Signed)
   Alexus Galka Dewilde - 68 y.o. female MRN [id number]  Date of birth: 14-Apr-1955  Office Visit Note: Visit Date: 04/19/2023 PCP: [name], MD Referred by: [name], MD  Subjective:  HPI: Julita Ozbun Crownover is a 68 y.o. female who presents today for follow up 13 weeks status post left long finger trigger digit release.  She is doing quite well postoperatively, has progressed nicely with range of motion with therapy.  Has resumed activities as [place] without restriction.  Pertinent ROS were reviewed with the patient and found to be negative unless otherwise specified above in HPI.   Assessment & Plan: Visit Diagnoses: No diagnosis found.  Plan: She has done very well postoperatively.  At this juncture, she can return as needed.  She is pleased with her outcome.  Follow-up: No follow-ups on file.   Meds & Orders: No orders of the defined types were placed in this encounter.  No orders of the defined types were placed in this encounter.    Procedures: No procedures performed       Objective:   Vital Signs: There were no vitals taken for this visit.  Ortho Exam Left hand: - Well-healed incision at the base of the long finger, skin is well-approximated, no erythema or drainage - Able to perform full digital range of motion without residual clicking or locking - Sensation intact distally, hand is warm well-perfused   Imaging: No results found.   Jovann Luse Trevor Mace, M.D. [facility] 9:21 AM

## 2023-04-24 ENCOUNTER — Ambulatory Visit: Payer: 59 | Attending: Orthopedic Surgery | Admitting: Occupational Therapy

## 2023-04-24 DIAGNOSIS — M25642 Stiffness of left hand, not elsewhere classified: Secondary | ICD-10-CM | POA: Diagnosis not present

## 2023-04-24 DIAGNOSIS — M79642 Pain in left hand: Secondary | ICD-10-CM | POA: Diagnosis not present

## 2023-04-24 DIAGNOSIS — M6281 Muscle weakness (generalized): Secondary | ICD-10-CM | POA: Insufficient documentation

## 2023-04-24 DIAGNOSIS — R278 Other lack of coordination: Secondary | ICD-10-CM | POA: Diagnosis not present

## 2023-04-24 NOTE — Therapy (Signed)
 OUTPATIENT OCCUPATIONAL THERAPY ORTHO TREATMENT / Discharge  Patient Name: [name] MRN: [id number] DOB:June 21, 1955, 68 y.o., female Today's Date: 04/24/2023  PCP: Mercer Clotilda SAUNDERS, Md REFERRING PROVIDER: Arlinda Buster, MD  OCCUPATIONAL THERAPY DISCHARGE SUMMARY  Visits from Start of Care: 5  Current functional level related to goals / functional outcomes: Pt has met 2 out of 3 STG and 3 out of 4 LTG to satisfactory levels and is pleased with outcomes. Pt partially met remaining STG and LTG.  Remaining deficits: Pt has no more significant functional deficits or pain.   Education / Equipment: Pt has all needed materials and education. Pt understands how to continue on with self-management. See tx notes for more details.    Patient goals were partially met. Patient is being discharged due to meeting the stated rehab goals and pt is pleased with current functional level. Pt agreeable to D/C.      END OF SESSION:  OT End of Session - 04/24/23 1039     Visit Number 5    Number of Visits 8    Date for OT Re-Evaluation 04/30/23    Authorization Type [employer] Dual complete - covered at 100%    OT Start Time 1015    OT Stop Time 1038    OT Time Calculation (min) 23 min    Activity Tolerance Patient tolerated treatment well    Behavior During Therapy WFL for tasks assessed/performed               Past Medical History:  Diagnosis Date   Adrenal mass (HCC) 03/226   Benign   Arthritis    knees/multiple orthopedic conditons; lower back   Asthma    per pt   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   COPD (chronic obstructive pulmonary disease) (HCC)    inhalers dependent on environment   Depression    Diabetes mellitus    120s usually fasting -  dx more than 10 yrs ago   Dizziness, nonspecific    DVT (deep venous thrombosis) (HCC)    Recurrent   Fibromyalgia    GERD (gastroesophageal reflux disease)    Heart murmur    History of blood  transfusion    History of cocaine abuse (HCC)    Remote history    Hyperlipidemia    Hypertension    takes meds daily   Hypothyroidism    Lupus Anticoagulant Positive    On home oxygen  therapy    at night   Shortness of breath    exertion or lying flat   Sleep apnea    2l of oxygen  at night (as of 12/6, she used to)   Past Surgical History:  Procedure Laterality Date   ABDOMINAL HYSTERECTOMY  1989   Fibroids   ANTERIOR LUMBAR FUSION  01/03/2012   Procedure: ANTERIOR LUMBAR FUSION 1 LEVEL;  Surgeon: Rockey LITTIE Peru, MD;  Location: [facility];  Service: Neurosurgery;  Laterality: N/A;  Lumbar Four-Five Anterior Lumbar Interbody Fusion with Instrumentation   BACK SURGERY     cervical spine---disk disease   CARPAL TUNNEL RELEASE     Bilateral   CESAREAN SECTION     CHOLECYSTECTOMY     CYSTOSCOPY/RETROGRADE/URETEROSCOPY/STONE EXTRACTION WITH BASKET Right 04/26/2014   Procedure: CYSTOSCOPY/ RIGHT RETROGRADE/ RIGHT URETEROSCOPY/URETERAL AND RENAL PELVIS BIOPSY;  Surgeon: Ricardo Likens, MD;  Location: [facility];  Service: Urology;  Laterality: Right;   ESOPHAGOGASTRODUODENOSCOPY (EGD) WITH PROPOFOL  N/A 08/25/2022   Procedure: ESOPHAGOGASTRODUODENOSCOPY (EGD) WITH PROPOFOL ;  Surgeon: Rollin Dover, MD;  Location: [facility];  Service: Gastroenterology;  Laterality: N/A;   gallstones removed     HEMOSTASIS CLIP PLACEMENT  08/25/2022   Procedure: HEMOSTASIS CLIP PLACEMENT;  Surgeon: Rollin Dover, MD;  Location: [facility];  Service: Gastroenterology;;   HOT HEMOSTASIS N/A 08/25/2022   Procedure: HOT HEMOSTASIS (ARGON PLASMA COAGULATION/BICAP);  Surgeon: Rollin Dover, MD;  Location: [facility];  Service: Gastroenterology;  Laterality: N/A;   POLYPECTOMY  08/25/2022   Procedure: POLYPECTOMY;  Surgeon: Rollin Dover, MD;  Location: [facility];  Service: Gastroenterology;;   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   bilateral   right elbow surgery     RIGHT HEART CATH N/A 02/28/2021   Procedure:  RIGHT HEART CATH;  Surgeon: Verlin Lonni BIRCH, MD;  Location: [facility];  Service: Cardiovascular;  Laterality: N/A;   THYROID  LOBECTOMY Right 02/27/2017   THYROID  LOBECTOMY Right 02/27/2017   Procedure: RIGHT THYROID  LOBECTOMY;  Surgeon: Vanderbilt Ned, MD;  Location: [facility];  Service: General;  Laterality: Right;   TUBAL LIGATION     Patient Active Problem List   Diagnosis Date Noted   Type 2 diabetes mellitus without complications (HCC) 03/05/2023   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   Chronic idiopathic constipation 03/05/2023   Gastro-esophageal reflux disease without esophagitis 03/05/2023   Iron  deficiency anemia 03/05/2023   Screening for malignant neoplasm of colon 03/05/2023   Generalized anxiety disorder 10/19/2022   Major depressive disorder, recurrent, moderate (HCC) 10/19/2022   Anemia of chronic disease 08/23/2022   Left knee pain 08/23/2022   Mixed hyperlipidemia 10/07/2021   Paroxysmal SVT (supraventricular tachycardia) (HCC) 10/07/2021   Allergic rhinitis 11/29/2020   Allergic rhinitis due to pollen 11/29/2020   Moderate persistent asthma, uncomplicated 11/29/2020   Osteoarthritis of spine with radiculopathy, lumbar region 08/24/2020   Chronic back pain 08/18/2020   Obesity, diabetes, and hypertension syndrome (HCC) 02/27/2020   Vitreomacular adhesion of both eyes 02/09/2020   Pseudophakia of both eyes 02/09/2020   Chronic diastolic CHF (congestive heart failure) (HCC) 03/12/2019   Hypernatremia 03/09/2019   Suspected COVID-19 virus infection 03/09/2019   Thyroid  mass 02/27/2017   Pain in right hip 05/24/2016   Effusion, right knee 04/28/2016   Presence of right artificial knee joint 04/28/2016   Abnormal LFTs    Decreased sensation    Paresthesia 06/12/2014   Obstruction of kidney 04/24/2014   Chronic anticoagulation 04/24/2014   Crack cocaine use 04/24/2014   Depression 04/24/2014   Fibromyalgia 04/24/2014   Obstructive sleep apnea on  CPAP 04/24/2014   Lupus anticoagulant positive 04/24/2014   Adrenal mass, right (HCC) 04/24/2014   Right kidney mass 04/24/2014   Hydronephrosis of right kidney 04/24/2014   Supratherapeutic INR 04/24/2014   Renal mass 04/24/2014   Chronic obstructive pulmonary disease (HCC) 04/24/2014   Right lower quadrant abdominal pain    Morbid obesity (HCC) 08/21/2013   Constipation 08/19/2013   Lumbosacral spondylosis without myelopathy 11/19/2012   Morbid obesity with BMI of 45.0-49.9, adult (HCC) 01/05/2012   Bilateral deep vein thromboses (HCC) 12/13/2011   Hypokalemia 11/05/2011   Sciatica 11/05/2011   Essential hypertension 11/05/2011   Diabetes type 2, controlled (HCC) 11/05/2011    ONSET DATE: 01/31/2023  REFERRING DIAG: F34.667 (ICD-10-CM) - Trigger finger, left middle finger Note:  S/P Left long finger trigger release Referral placed occupational therapy for range of motion exercises with transition to strengthening as appropriate. Can transition to home exercise program as well.  THERAPY DIAG:  Pain in  left hand  Stiffness of left hand, not elsewhere classified  Muscle weakness (generalized)  Other lack of coordination  Rationale for Evaluation and Treatment: Rehabilitation  SUBJECTIVE:   SUBJECTIVE STATEMENT: Pt reported recent MD appointment and stated that the doctor turned me loose. Pt reported activities are going alright and reported no difficulty with any tasks.  Pt accompanied by: self  PERTINENT HISTORY: DM, Asthma, bronchitis, HTN  Per 04/19/23 MD Office Visit update: Pt is doing quite well postoperatively, has progressed nicely with range of motion with therapy. 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  PRECAUTIONS: None     WEIGHT BEARING RESTRICTIONS: No  PAIN:  Are you having pain? No  FALLS: Has patient fallen in last 6 months? Yes. Number of falls 1  LIVING ENVIRONMENT: Lives with: lives alone Lives in: apartment on  ground floor Has following equipment at home: Single point cane, Walker - 2 wheeled, shower chair, and bed side commode  PLOF:  has aide everyday M-Sun (assist in cleaning, cooking, and BADLS), on disability  PATIENT GOALS: get my Lt hand better  NEXT MD VISIT: 03/02/23  OBJECTIVE:  Note: Objective measures were completed at Evaluation unless otherwise noted.  HAND DOMINANCE: Left  ADLs: Overall ADLs: needs assist (has had aide for 11 years)   FUNCTIONAL OUTCOME MEASURES: Quick Dash: 48% deficit (#2 and #6 were N/A as pt has not done for years therefore marked no difficulty)   UPPER EXTREMITY ROM:  BUE AROM WFL's w/ stiffness Lt long finger    Active ROM Right eval Left eval  Thumb MCP (0-60)    Thumb IP (0-80)    Thumb Radial abd/add (0-55)     Thumb Palmar abd/add (0-45)     Thumb Opposition to Small Finger     Index MCP (0-90)     Index PIP (0-100)     Index DIP (0-70)      Long MCP (0-90)   50   Long PIP (0-100)   90   Long DIP (0-70)      Ring MCP (0-90)      Ring PIP (0-100)      Ring DIP (0-70)      Little MCP (0-90)      Little PIP (0-100)      Little DIP (0-70)      (Blank rows = not tested)   UPPER EXTREMITY MMT:   not tested - N/A  HAND FUNCTION: Grip strength: Right: 51 lbs; Left: 20 lbs, Lateral pinch: Right: 14 lbs, Left: 12 lbs, and 3 point pinch: Right: 10 lbs, Left: 7 lbs 04/17/23: Lt grip = 38 lbs, Lateral pinch = 17 lbs, 3 tip pinch = 10 lbs  COORDINATION: Limited by pain Lt hand. Pt trying to manipulate pen but difficulty d/t pain  SENSATION: DENIES CHANGE  EDEMA: Moderate Lt long finger  COGNITION: Overall cognitive status:  Pt reports she dropped out of school in 10th grade and difficulty with reading    OBSERVATIONS: Pt poor historian, low education/reading level, got confused with location of our suite. Pt also with generalized constant pain Lt hand and tightness at incision area, which is well healed but dense scar tissue  present   TODAY'S TREATMENT:  OT assessed pt's progress towards goals, see below for updates.   OT educated pt on OT D/C today d/t pt's good progress towards goals. Pt agreeable to D/C. Pt confirmed no additional questions or concerns related to OT.  OT reviewed theraputty HEP - to improve carryover of HEP, to improve BUE strengthening. Pt returned demo of all exercises with min v/c. OT educated pt on importance of continuing HEP after D/C from OT. Pt verbalized understanding.    PATIENT EDUCATION: Education details: see today's treatment above Person educated: Patient Education method: Explanation, Demonstration, Verbal cues, and Handouts Education comprehension: verbalized understanding and returned demonstration  HOME EXERCISE PROGRAM: 03/20/23: scar massage, A/ROM HEP  04/10/23 - additional copy of 03/20/23 HEP (see pt instructions), Yellow theraputty HEP - Access Code: [email or web address]  GOALS: Goals reviewed with patient? Yes  SHORT TERM GOALS: Target date: 03/30/23  Independent with A/ROM HEP for Lt hand  Baseline: Puzio status: MET  2.  Pt independent with scar tissue massage and desensitization prn Baseline:  04/10/23 - OT reviewed scar tissue massage and recommended using 2 fingers or thumb of opposite hand for joint protection. Pt returned demonstration. Pt reported wearing scar pad at night, most nights though sometimes forgets. [place] status: MET   3.  Improve Lt long finger ROM at MP and PIP joint by 10*  Baseline: MP 50*, PIP 90* 04/10/23 - MP 66*, PIP 90* (met MP Fotopoulos, maintained PIP AROM) - composite fist [facility] 04/17/23 - PIP 95* 04/24/23 - PIP 90* Hipple status: Partially met: MET MP joint, not met affected PIP joint though WFL and same AROM as unaffected side   LONG TERM GOALS: Target date: 04/30/23  Independent with strengthening HEP for grip and  pinch strength Lt hand Baseline:  04/10/23 - OT issued yellow theraputty HEP to improve strengthening and FM coordination of affected UE. Pt returned demonstration following v/c and therapist modeling. 04/24/23 - Pt returned demo of putty HEP with min v/c and visual handouts. [place] status: 04/24/23 - partially MET  2.  Pain less than or equal to 5/10 with all daily tasks Baseline: 9/10 - constant 04/10/23 - Pt reported 4/10 pain level of Lt hand today. [place] status: MET (0-5/10)  3.  Improve Lt grip strength to 35 lbs or greater to open jars/tight containers Baseline: 20 lbs [place] status: MET (04/17/23 = 38 LBS)   4.  Improve Lt 3 tip pinch by 2 lbs or greater for pinching activities Baseline: 7 lbs [place] status: MET (04/17/23: 10 LBS)   ASSESSMENT:  CLINICAL IMPRESSION: Pt has met 2/3 STG's and 3/4 LTG's at this time and partially met remaining goals. Patient goals were partially met. Patient is being discharged due to meeting the stated rehab goals and pt is pleased with current functional level. Pt agreeable to D/C. Pt has all needed materials and education. Pt confirmed no additional questions/concerns related to OT at this time.  PERFORMANCE DEFICITS: in functional skills including ADLs, coordination, edema, ROM, strength, pain, Fine motor control, skin integrity, and UE functional use  IMPAIRMENTS: are limiting patient from ADLs and social participation.   COMORBIDITIES: may have co-morbidities  that affects occupational performance. Patient will benefit from skilled OT to address above impairments and improve overall function.  MODIFICATION OR ASSISTANCE TO COMPLETE EVALUATION: No modification of tasks or assist necessary to complete an evaluation.  OT OCCUPATIONAL PROFILE AND HISTORY: Problem focused assessment: Including review of records relating to presenting problem.  CLINICAL DECISION MAKING: LOW - limited treatment  options, no task modification necessary  REHAB POTENTIAL:  Good  EVALUATION COMPLEXITY: Low      PLAN:  OT FREQUENCY: 1x/week  OT DURATION: 8 weeks  PLANNED INTERVENTIONS: 97535 self care/ADL training, 02889 therapeutic exercise, 97530 therapeutic activity, 97112 neuromuscular re-education, 97140 manual therapy, 97035 ultrasound, 97018 paraffin, 02960 fluidotherapy, 97010 moist heat, 97010 cryotherapy, 97014 electrical stimulation unattended, 97760 Orthotics management and training, 02239 Splinting (initial encounter), scar mobilization, passive range of motion, patient/family education, and DME and/or AE instructions  RECOMMENDED OTHER SERVICES: none at this time  CONSULTED AND AGREED WITH PLAN OF CARE: Patient  PLAN FOR NEXT SESSION:  N/A - Pt D/C from OT   Geofm FORBES Coder, OT 04/24/2023, 10:48 AM

## 2023-04-27 ENCOUNTER — Inpatient Hospital Stay: Admission: RE | Admit: 2023-04-27 | Payer: 59 | Source: Ambulatory Visit

## 2023-04-30 ENCOUNTER — Ambulatory Visit (INDEPENDENT_AMBULATORY_CARE_PROVIDER_SITE_OTHER): Payer: 59 | Admitting: Podiatry

## 2023-04-30 DIAGNOSIS — Z91199 Patient's noncompliance with other medical treatment and regimen due to unspecified reason: Secondary | ICD-10-CM

## 2023-04-30 NOTE — Progress Notes (Signed)
 No show

## 2023-05-03 ENCOUNTER — Inpatient Hospital Stay: Payer: 59 | Attending: Oncology

## 2023-05-10 DIAGNOSIS — G4733 Obstructive sleep apnea (adult) (pediatric): Secondary | ICD-10-CM | POA: Diagnosis not present

## 2023-05-11 ENCOUNTER — Encounter: Payer: Self-pay | Admitting: Pulmonary Disease

## 2023-05-22 ENCOUNTER — Ambulatory Visit
Admission: RE | Admit: 2023-05-22 | Discharge: 2023-05-22 | Disposition: A | Discharge status: Home / Self Care | Payer: 59 | Source: Ambulatory Visit | Attending: Family Medicine | Admitting: Family Medicine

## 2023-05-22 DIAGNOSIS — I10 Essential (primary) hypertension: Secondary | ICD-10-CM | POA: Diagnosis not present

## 2023-05-22 DIAGNOSIS — R1031 Right lower quadrant pain: Secondary | ICD-10-CM | POA: Diagnosis not present

## 2023-05-22 DIAGNOSIS — E119 Type 2 diabetes mellitus without complications: Secondary | ICD-10-CM | POA: Diagnosis not present

## 2023-05-22 DIAGNOSIS — D131 Benign neoplasm of stomach: Secondary | ICD-10-CM | POA: Diagnosis not present

## 2023-05-22 MED ORDER — IOPAMIDOL (ISOVUE-300) INJECTION 61%
100.0000 mL | Freq: Once | INTRAVENOUS | Status: AC | PRN
  Administered 2023-05-22: 100 mL via INTRAVENOUS

## 2023-05-23 NOTE — Addendum Note (Signed)
 Addended by: Gershon Crane A on: 05/23/2023 12:57 PM   Modules accepted: Orders

## 2023-05-23 NOTE — Progress Notes (Signed)
 Patient informed of the results and stated she already has an appt scheduled for the MRI in April.

## 2023-05-29 ENCOUNTER — Ambulatory Visit (HOSPITAL_BASED_OUTPATIENT_CLINIC_OR_DEPARTMENT_OTHER): Payer: 59 | Admitting: Internal Medicine

## 2023-05-30 ENCOUNTER — Ambulatory Visit (INDEPENDENT_AMBULATORY_CARE_PROVIDER_SITE_OTHER): Payer: 59 | Admitting: Podiatry

## 2023-05-30 ENCOUNTER — Encounter: Payer: Self-pay | Admitting: Podiatry

## 2023-05-30 VITALS — Ht 66.0 in | Wt 283.0 lb

## 2023-05-30 DIAGNOSIS — R600 Localized edema: Secondary | ICD-10-CM

## 2023-05-30 DIAGNOSIS — M2142 Flat foot [pes planus] (acquired), left foot: Secondary | ICD-10-CM | POA: Diagnosis not present

## 2023-05-30 DIAGNOSIS — M79674 Pain in right toe(s): Secondary | ICD-10-CM

## 2023-05-30 DIAGNOSIS — B351 Tinea unguium: Secondary | ICD-10-CM | POA: Diagnosis not present

## 2023-05-30 DIAGNOSIS — M79675 Pain in left toe(s): Secondary | ICD-10-CM | POA: Diagnosis not present

## 2023-05-30 DIAGNOSIS — E1142 Type 2 diabetes mellitus with diabetic polyneuropathy: Secondary | ICD-10-CM | POA: Diagnosis not present

## 2023-05-30 DIAGNOSIS — M2141 Flat foot [pes planus] (acquired), right foot: Secondary | ICD-10-CM | POA: Diagnosis not present

## 2023-05-30 DIAGNOSIS — E119 Type 2 diabetes mellitus without complications: Secondary | ICD-10-CM | POA: Diagnosis not present

## 2023-06-03 NOTE — Progress Notes (Signed)
 ANNUAL DIABETIC FOOT EXAM  Subjective: [name] presents today for annual diabetic foot exam. Chief Complaint  Patient presents with   Nail Problem    Pt is here for [facility] unsure of last A1C PCP is Dr Salomon Fick and LOV was in December.   Patient confirms h/o diabetes.  Patient denies any h/o foot wounds.  Patient has been diagnosed with neuropathy.  [name], MD is patient's PCP.  Past Medical History:  Diagnosis Date   Adrenal mass (HCC) 03/226   Benign   Arthritis    knees/multiple orthopedic conditons; lower back   Asthma    per pt   Clotting disorder (HCC)    +beta-2-glycoprotein IgA antibody   COPD (chronic obstructive pulmonary disease) (HCC)    inhalers dependent on environment   Depression    Diabetes mellitus    120s usually fasting -  dx more than 10 yrs ago   Dizziness, nonspecific    DVT (deep venous thrombosis) (HCC)    Recurrent   Fibromyalgia    GERD (gastroesophageal reflux disease)    Heart murmur    History of blood transfusion    History of cocaine abuse (HCC)    Remote history    Hyperlipidemia    Hypertension    takes meds daily   Hypothyroidism    Lupus Anticoagulant Positive    On home oxygen therapy    at night   Shortness of breath    exertion or lying flat   Sleep apnea    2l of oxygen at night (as of 12/6, she used to)   Patient Active Problem List   Diagnosis Date Noted   Type 2 diabetes mellitus without complications (HCC) 03/05/2023   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   Chronic idiopathic constipation 03/05/2023   Gastro-esophageal reflux disease without esophagitis 03/05/2023   Iron deficiency anemia 03/05/2023   Screening for malignant neoplasm of colon 03/05/2023   Generalized anxiety disorder 10/19/2022   Major depressive disorder, recurrent, moderate (HCC) 10/19/2022   Anemia of chronic disease 08/23/2022   Left knee pain 08/23/2022   Mixed hyperlipidemia 10/07/2021   Paroxysmal SVT  (supraventricular tachycardia) (HCC) 10/07/2021   Allergic rhinitis 11/29/2020   Allergic rhinitis due to pollen 11/29/2020   Moderate persistent asthma, uncomplicated 11/29/2020   Osteoarthritis of spine with radiculopathy, lumbar region 08/24/2020   Chronic back pain 08/18/2020   Obesity, diabetes, and hypertension syndrome (HCC) 02/27/2020   Vitreomacular adhesion of both eyes 02/09/2020   Pseudophakia of both eyes 02/09/2020   Chronic diastolic CHF (congestive heart failure) (HCC) 03/12/2019   Hypernatremia 03/09/2019   Suspected COVID-19 virus infection 03/09/2019   Thyroid mass 02/27/2017   Pain in right hip 05/24/2016   Effusion, right knee 04/28/2016   Presence of right artificial knee joint 04/28/2016   Abnormal LFTs    Decreased sensation    Paresthesia 06/12/2014   Obstruction of kidney 04/24/2014   Chronic anticoagulation 04/24/2014   Crack cocaine use 04/24/2014   Depression 04/24/2014   Fibromyalgia 04/24/2014   Obstructive sleep apnea on CPAP 04/24/2014   Lupus anticoagulant positive 04/24/2014   Adrenal mass, right (HCC) 04/24/2014   Right kidney mass 04/24/2014   Hydronephrosis of right kidney 04/24/2014   Supratherapeutic INR 04/24/2014   Renal mass 04/24/2014   Chronic obstructive pulmonary disease (HCC) 04/24/2014   Right lower quadrant abdominal pain    Morbid obesity (HCC) 08/21/2013   Constipation 08/19/2013   Lumbosacral spondylosis without myelopathy 11/19/2012  Morbid obesity with BMI of 45.0-49.9, adult (HCC) 01/05/2012   Bilateral deep vein thromboses (HCC) 12/13/2011   Hypokalemia 11/05/2011   Sciatica 11/05/2011   Essential hypertension 11/05/2011   Diabetes type 2, controlled (HCC) 11/05/2011   Past Surgical History:  Procedure Laterality Date   ABDOMINAL HYSTERECTOMY  1989   Fibroids   ANTERIOR LUMBAR FUSION  01/03/2012   Procedure: ANTERIOR LUMBAR FUSION 1 LEVEL;  Surgeon: Carmela Hurt, MD;  Location: [facility];  Service:  Neurosurgery;  Laterality: N/A;  Lumbar Four-Five Anterior Lumbar Interbody Fusion with Instrumentation   BACK SURGERY     cervical spine---disk disease   CARPAL TUNNEL RELEASE     Bilateral   CESAREAN SECTION     CHOLECYSTECTOMY     CYSTOSCOPY/RETROGRADE/URETEROSCOPY/STONE EXTRACTION WITH BASKET Right 04/26/2014   Procedure: CYSTOSCOPY/ RIGHT RETROGRADE/ RIGHT URETEROSCOPY/URETERAL AND RENAL PELVIS BIOPSY;  Surgeon: Sebastian Ache, MD;  Location: [facility];  Service: Urology;  Laterality: Right;   ESOPHAGOGASTRODUODENOSCOPY (EGD) WITH PROPOFOL N/A 08/25/2022   Procedure: ESOPHAGOGASTRODUODENOSCOPY (EGD) WITH PROPOFOL;  Surgeon: Jeani Hawking, MD;  Location: [facility];  Service: Gastroenterology;  Laterality: N/A;   gallstones removed     HEMOSTASIS CLIP PLACEMENT  08/25/2022   Procedure: HEMOSTASIS CLIP PLACEMENT;  Surgeon: Jeani Hawking, MD;  Location: [facility];  Service: Gastroenterology;;   HOT HEMOSTASIS N/A 08/25/2022   Procedure: HOT HEMOSTASIS (ARGON PLASMA COAGULATION/BICAP);  Surgeon: Jeani Hawking, MD;  Location: [facility];  Service: Gastroenterology;  Laterality: N/A;   POLYPECTOMY  08/25/2022   Procedure: POLYPECTOMY;  Surgeon: Jeani Hawking, MD;  Location: [facility];  Service: Gastroenterology;;   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   bilateral   right elbow surgery     RIGHT HEART CATH N/A 02/28/2021   Procedure: RIGHT HEART CATH;  Surgeon: Kathleene Hazel, MD;  Location: [facility];  Service: Cardiovascular;  Laterality: N/A;   THYROID LOBECTOMY Right 02/27/2017   THYROID LOBECTOMY Right 02/27/2017   Procedure: RIGHT THYROID LOBECTOMY;  Surgeon: Harriette Bouillon, MD;  Location: [facility];  Service: General;  Laterality: Right;   TUBAL LIGATION     Current Outpatient Medications on File Prior to Visit  Medication Sig Dispense Refill   ACCU-CHEK GUIDE TEST test strip USE TO TEST BLOOD SUGAR UP TO FOUR TIMES A DAY AS DIRECTED 100 strip 10   Accu-Chek Softclix  Lancets lancets USE TO check blood glucose UP TO four times daily AS DIRECTED 400 each 3   albuterol (PROVENTIL) (2.5 MG/3ML) 0.083% nebulizer solution USE 1 VIAL IN NEBULIZER EVERY 6 HOURS - and as needed 120 mL 11   albuterol (VENTOLIN HFA) 108 (90 Base) MCG/ACT inhaler INHALE TWO (2) PUFFS BY MOUTH EVERY 4 HOURS AS NEEDED FOR SHORTNESS OF BREATH 8.5 g 10   blood glucose meter kit and supplies KIT Dispense based on patient and insurance preference. Use up to four times daily as directed. (FOR ICD-9 250.00, 250.01). 1 each 0   canagliflozin (INVOKANA) 100 MG TABS tablet Take 1 tablet (100 mg total) by mouth daily before breakfast. 30 tablet 11   cloNIDine (CATAPRES) 0.1 MG tablet TAKE ONE (1) TABLET BY MOUTH TWICE DAILY BEFORE BREAKFAST AND AT BEDTIME 60 tablet 10   diclofenac sodium (VOLTAREN) 1 % GEL Apply 2 g topically daily as needed (fibromyalgia pain- affected sites).     diltiazem (CARDIZEM CD) 240 MG 24 hr capsule TAKE 1 CAPSULE BY MOUTH DAILY BEFORE BREAKFAST *REFILL REQUEST* 30 capsule 10   DULoxetine (CYMBALTA) 30  MG capsule Take 90 mg by mouth at bedtime.     FEROSUL 325 (65 Fe) MG tablet TAKE ONE TABLET BY MOUTH ONCE DAILY 90 tablet 0   fexofenadine (ALLEGRA) 180 MG tablet Take 1 tablet (180 mg total) by mouth daily. 90 tablet 3   fluticasone (FLONASE) 50 MCG/ACT nasal spray Place 2 sprays into both nostrils 2 (two) times daily. (Patient taking differently: Place 2 sprays into both nostrils 2 (two) times daily as needed for allergies or rhinitis.) 18.2 mL 11   furosemide (LASIX) 80 MG tablet TAKE ONE (1) TABLET BY MOUTH TWICE DAILY 180 tablet 2   gabapentin (NEURONTIN) 300 MG capsule TAKE ONE CAPSULE BY MOUTH TWICE DAILY 180 capsule 2   HYDROcodone-acetaminophen (NORCO) 5-325 MG tablet Take 1 tablet by mouth every 6 (six) hours as needed for moderate pain (pain score 4-6). 20 tablet 0   Incontinence Supply Disposable (PROCARE BARIATRIC BRIEFS) MISC Use as needed. 96 each 11   lidocaine  (LIDODERM) 5 % Place 1 patch onto the skin daily as needed (to painful site- Remove & Discard patch within 12 hours or as directed by MD). 30 patch 4   LINZESS 145 MCG CAPS capsule TAKE 1 CAPSULE BY MOUTH EVERY OTHER DAY 30 capsule 10   metFORMIN (GLUCOPHAGE) 500 MG tablet TAKE 1 TABLET BY MOUTH EVERY DAY AT BEDTIME *REFILL REQUEST* 30 tablet 10   methocarbamol (ROBAXIN) 500 MG tablet Take 1 tablet (500 mg total) by mouth 2 (two) times daily. (Patient taking differently: Take 500 mg by mouth 2 (two) times daily as needed for muscle spasms.) 20 tablet 0   omeprazole (PRILOSEC) 20 MG capsule Take 1 capsule (20 mg total) by mouth 2 (two) times daily before a meal. 180 capsule 1   potassium chloride (MICRO-K) 10 MEQ CR capsule TAKE TWO (2) CAPSULES BY MOUTH EVERY DAY AT BEDTIME 60 capsule 10   QUEtiapine (SEROQUEL) 50 MG tablet Take 50 mg by mouth in the morning and at bedtime.     Semaglutide, 2 MG/DOSE, (OZEMPIC, 2 MG/DOSE,) 8 MG/3ML SOPN Inject 2 mg into the skin once a week. 9 mL 3   spironolactone (ALDACTONE) 25 MG tablet TAKE 1/2 TABLET BY MOUTH ONCE DAILY 45 tablet 3   SYMBICORT 160-4.5 MCG/ACT inhaler INHALE TWO (2) PUFFS BY MOUTH TWICE DAILY 10.2 g 10   traMADol (ULTRAM) 50 MG tablet Take 1 tablet (50 mg total) by mouth 3 (three) times daily as needed. 30 tablet 0   warfarin (COUMADIN) 10 MG tablet Take daily as directed by MD 30 tablet 5   warfarin (COUMADIN) 7.5 MG tablet TAKE 1 TABLET BY MOUTH DAILY 18 tablet 5   zonisamide (ZONEGRAN) 100 MG capsule TAKE 1 CAPSULE BY MOUTH EACH MORNING AND TAKE 2 CAPSULES IN THE EVENING 90 capsule 10   rosuvastatin (CRESTOR) 10 MG tablet Take 1 tablet (10 mg total) by mouth daily. 90 tablet 3   Current Facility-Administered Medications on File Prior to Visit  Medication Dose Route Frequency Provider Last Rate Last Admin   sodium chloride flush (NS) 0.9 % injection 3 mL  3 mL Intravenous Q12H Chandrasekhar, [name] A, MD        Allergies  Allergen  Reactions   Lisinopril Anaphylaxis, Swelling and Other (See Comments)    THROAT SWELLING  Pt states her throat started to "close up" after being on it for "so long."  THROAT SWELLING, Pt states her throat started to "close up" after being on it for "so long."  Prednisone Anaphylaxis, Swelling and Other (See Comments)    Throat swelling and tongue irritation   Spironolactone Other (See Comments)    Caused stroke-like symptoms with the mouth   Corticosteroids Rash    States she developed a rash on her tongue after a steroid injection in her back   Social History   Occupational History   Not on file  Tobacco Use   Smoking status: Never   Smokeless tobacco: Never  Vaping Use   Vaping status: Never Used  Substance and Sexual Activity   Alcohol use: No    Alcohol/week: 0.0 standard drinks of alcohol    Comment: beer in past    Drug use: Yes    Types: Cocaine    Comment: quit 2003-rehab progam in 2005   Sexual activity: Never   Family History  Problem Relation Age of Onset   Hypertension Mother    Diabetes Mother    Cancer Mother        Pancreatic   Hypertension Father    Heart failure Father    Hyperlipidemia Other    COPD Other    Immunization History  Administered Date(s) Administered   Fluad Trivalent(High Dose 65+) 12/22/2022   Influenza Split 01/08/2012   Influenza,inj,Quad PF,6+ Mos 12/31/2018   Influenza-Unspecified 12/18/2020   PFIZER Comirnaty(Gray Top)Covid-19 Tri-Sucrose Vaccine 11/12/2020   PFIZER(Purple Top)SARS-COV-2 Vaccination 06/30/2019, 07/21/2019   Pfizer Covid-19 Vaccine Bivalent Booster 67yrs & up 02/14/2021   Pneumococcal Polysaccharide-23 01/08/2012     Review of Systems: Negative except as noted in the HPI.   Objective: There were no vitals filed for this visit.  [name] is a pleasant 68 y.o. female in NAD. AAO X 3.  Diabetic foot exam was performed with the following findings:   Vascular Examination: CFT <3 seconds b/l.  DP pulses faintly palpable b/l. PT pulses nonpalpable b/l. Nonpitting edema b/l. Digital hair absent. Skin temperature gradient warm to warm b/l. No pain with calf compression. No ischemia or gangrene. No cyanosis or clubbing noted b/l.    Neurological Examination: Pt has subjective symptoms of neuropathy. Protective sensation diminished with 10g monofilament b/l.  Dermatological Examination: Pedal skin warm and supple b/l. No open wounds b/l. No interdigital macerations. Toenails 2-5 b/l thick, discolored, elongated with subungual debris and pain on dorsal palpation.  Anonychia noted bilateral great toes. Nailbed(s) epithelialized.   Musculoskeletal Examination: Muscle strength 5/5 to all lower extremity muscle groups bilaterally. Pes planus deformity noted bilateral LE.  Radiographs: None     Lab Results  Component Value Date   HGBA1C 7.5 (H) 12/04/2022   ADA Risk Categorization: High Risk  Patient has one or more of the following: Loss of protective sensation Absent pedal pulses Severe Foot deformity History of foot ulcer  Assessment: 1. Pain due to onychomycosis of toenails of both feet   2. Pes planus of both feet   3. Bilateral lower extremity edema   4. Diabetic peripheral neuropathy associated with type 2 diabetes mellitus (HCC)   5. Encounter for diabetic foot exam (HCC)     Plan: Diabetic foot examination performed today. All patient's and/or POA's questions/concerns addressed on today's visit. Toenails 2-5 bilaterally debrided in length and girth without incident. Monitor blood glucose per PCP/Endocrinologist's recommendations.Continue soft, supportive shoe gear daily. Report any pedal injuries to medical professional. Call office if there are any questions/concerns. -Patient/POA to call should there be question/concern in the interim. Return in about 3 months (around 08/30/2023).  Freddie Breech, DPM  [place] LOCATION: [address] [place] [address]                   Office [phone]   [facility] LOCATION: [address] [place], [place] [address] Office [phone]

## 2023-06-04 DIAGNOSIS — G4733 Obstructive sleep apnea (adult) (pediatric): Secondary | ICD-10-CM | POA: Diagnosis not present

## 2023-06-05 ENCOUNTER — Ambulatory Visit: Payer: Self-pay | Admitting: Family Medicine

## 2023-06-05 NOTE — Telephone Encounter (Signed)
 Called and spoke with patient, patient is aware, patient has verbalized understanding

## 2023-06-05 NOTE — Telephone Encounter (Signed)
 Chief Complaint: Low blood pressure Symptoms: dizziness Frequency: Noted at appt yesterday Pertinent Negatives: Patient denies N/V/D, blood in stool Disposition: [] ED /[] Urgent Care (no appt availability in office) / [x] Appointment(In office/virtual)/ []  [facility] [place] [facility]/ [] Home Care/ [x] Refused Recommended Disposition /[]  Mobile Bus/ []  Follow-up with PCP Additional Notes: Pt reports she was at an appt yesterday and her BP reading was low. She notes today her reading was 104/63, pt notes intermittent dizziness that feels like "I'm staggering around like I'm drunk". Pt encouraged to increase fluids. OV scheduled Friday due to transportation limitations. This RN educated pt on home care, new-worsening symptoms, when to call back/seek emergent care. Pt verbalized understanding and agrees to plan.    Copied from CRM [id number]. Topic: Clinical - Medical Advice >> Jun 05, 2023 11:32 AM Florestine Avers wrote: Reason for CRM: Patient states that she went to her sleep apnea doctor yesterday and her blood pressure was 104/63, she wants to know if her pressure was normal or not. Patient is requesting a call back. Reason for Disposition  [1] Systolic BP 90-110 AND [2] taking blood pressure medications AND [3] dizzy, lightheaded or weak  Answer Assessment - Initial Assessment Questions 1. BLOOD PRESSURE: "What is the blood pressure?" "Did you take at least two measurements 5 minutes apart?"     104/63 2. ONSET: "When did you take your blood pressure?"     Today AM 3. HOW: "How did you obtain the blood pressure?" (e.g., visiting nurse, automatic home BP monitor)     Automatic home 4. HISTORY: "Do you have a history of low blood pressure?" "What is your blood pressure normally?"     No 5. MEDICINES: "Are you taking any medications for blood pressure?" If Yes, ask: "Have they been changed recently?"     Clonidine, Aldactone PRN  7. OTHER SYMPTOMS: "Have you been sick recently?" "Have you had a  recent injury?"     Pt states sometimes she "staggers like I've been drinking"  Protocols used: Blood Pressure - Low-A-AH

## 2023-06-05 NOTE — Telephone Encounter (Signed)
 As pt feeling symptomatic can hold spironolactone 12.5 mg.  Continue to monitor bp and keep a log to bring with her to next appt.

## 2023-06-06 ENCOUNTER — Telehealth: Payer: Self-pay | Admitting: Internal Medicine

## 2023-06-06 NOTE — Telephone Encounter (Signed)
 Called pt advised is taking spironolactone for history of low potassium, HF and BP control.  Pt request that I call her sister [name] to advise of this information; sister is listed as an emergency contact for pt.  Called [name] advised of the above information.  Sister reports pt c/o low BP and feeling fatigued so PCP stopped Spironolactone.  Per sister pt has an OV with PCP on Friday and if there are any questions will have PCP contact our office. No further concerns at this time

## 2023-06-06 NOTE — Telephone Encounter (Signed)
 Pt c/o medication issue:  1. Name of Medication: Spironolactone  2. How are you currently taking this medication (dosage and times per day)?   3. Are you having a reaction (difficulty breathing--STAT)?   4. What is your medication issue? Patient wants to know what is she taking this medicine for?

## 2023-06-08 ENCOUNTER — Ambulatory Visit: Admitting: Family Medicine

## 2023-06-11 ENCOUNTER — Ambulatory Visit (INDEPENDENT_AMBULATORY_CARE_PROVIDER_SITE_OTHER): Admitting: Family Medicine

## 2023-06-11 ENCOUNTER — Encounter: Payer: Self-pay | Admitting: Family Medicine

## 2023-06-11 VITALS — BP 146/80 | HR 75 | Temp 98.3°F | Ht 66.0 in | Wt 284.2 lb

## 2023-06-11 DIAGNOSIS — I10 Essential (primary) hypertension: Secondary | ICD-10-CM | POA: Diagnosis not present

## 2023-06-11 DIAGNOSIS — E1140 Type 2 diabetes mellitus with diabetic neuropathy, unspecified: Secondary | ICD-10-CM | POA: Diagnosis not present

## 2023-06-11 DIAGNOSIS — Z7985 Long-term (current) use of injectable non-insulin antidiabetic drugs: Secondary | ICD-10-CM

## 2023-06-11 DIAGNOSIS — Z7984 Long term (current) use of oral hypoglycemic drugs: Secondary | ICD-10-CM | POA: Diagnosis not present

## 2023-06-11 DIAGNOSIS — I5032 Chronic diastolic (congestive) heart failure: Secondary | ICD-10-CM

## 2023-06-11 DIAGNOSIS — E559 Vitamin D deficiency, unspecified: Secondary | ICD-10-CM | POA: Diagnosis not present

## 2023-06-11 DIAGNOSIS — R42 Dizziness and giddiness: Secondary | ICD-10-CM

## 2023-06-11 DIAGNOSIS — E278 Other specified disorders of adrenal gland: Secondary | ICD-10-CM | POA: Diagnosis not present

## 2023-06-11 DIAGNOSIS — Z91148 Patient's other noncompliance with medication regimen for other reason: Secondary | ICD-10-CM

## 2023-06-11 LAB — COMPREHENSIVE METABOLIC PANEL
ALT: 17 U/L (ref 0–35)
AST: 16 U/L (ref 0–37)
Albumin: 4.4 g/dL (ref 3.5–5.2)
Alkaline Phosphatase: 85 U/L (ref 39–117)
BUN: 13 mg/dL (ref 6–23)
CO2: 26 meq/L (ref 19–32)
Calcium: 9.8 mg/dL (ref 8.4–10.5)
Chloride: 106 meq/L (ref 96–112)
Creatinine, Ser: 0.92 mg/dL (ref 0.40–1.20)
GFR: 64.2 mL/min (ref >60.00)
Glucose, Bld: 106 mg/dL — ABNORMAL HIGH (ref 70–99)
Potassium: 3.9 meq/L (ref 3.5–5.1)
Sodium: 141 meq/L (ref 135–145)
Total Bilirubin: 0.3 mg/dL (ref 0.2–1.2)
Total Protein: 8.5 g/dL — ABNORMAL HIGH (ref 6.0–8.3)

## 2023-06-11 LAB — T4, FREE: Free T4: 0.55 ng/dL — ABNORMAL LOW (ref 0.60–1.60)

## 2023-06-11 LAB — GLUCOSE, POCT (MANUAL RESULT ENTRY): POC Glucose: 113 mg/dL — AB (ref 70–99)

## 2023-06-11 LAB — TSH: TSH: 2.96 u[IU]/mL (ref 0.35–5.50)

## 2023-06-11 MED ORDER — LANCET DEVICE MISC: 1.0000 | Freq: Three times a day (TID) | 0 refills | Status: AC

## 2023-06-11 MED ORDER — BLOOD GLUCOSE MONITORING SUPPL DEVI: 1.0000 | Freq: Three times a day (TID) | 0 refills | Status: DC

## 2023-06-11 MED ORDER — BLOOD GLUCOSE TEST VI STRP: 1.0000 | ORAL_STRIP | Freq: Three times a day (TID) | 3 refills | Status: DC

## 2023-06-11 MED ORDER — LANCETS MISC. MISC: 3 refills | Status: DC

## 2023-06-11 NOTE — Patient Instructions (Signed)
 You have an MRI coming up regarding the right adrenal mass noted on CT scan from 3//2025.  Based on results of that you may need to see an endocrinologist

## 2023-06-11 NOTE — Progress Notes (Signed)
 Established Patient Office Visit   Subjective  Patient ID: [name], female    DOB: February 08, 1956  Age: 68 y.o. MRN: [id number]  Chief Complaint  Patient presents with   Dizziness    Bp changes     Patient is a 68 year old female seen for follow-up.  Patient having episodes of hypotension and dizziness.  Patient states she feels like she "is drunk".  Pt was told to hold spironolactone 12.5 mg, however she has not been taking it x 2 months. States she did not know what it was for.  BP at home 103/68, 105/74, 104/63, 116/76, 156/83, 132/84, 122/76, 128/73, 143/75, 138/80, 122/72.  Patient states she has been feeling so bad she did not even do anything for her birthday.  Has pill packs with her, unable to recognize the medications.  Inquires what they all are.  Patient has not been checking blood sugar as lost her glucometer a month ago.  A recent Rx for glucometer was sent in on December 2024.  Patient had CT scan this month and has an MRI next month.  States there was a spot on her kidney and pelvic mass.    Patient Active Problem List   Diagnosis Date Noted   Type 2 diabetes mellitus without complications (HCC) 03/05/2023   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   Chronic idiopathic constipation 03/05/2023   Gastro-esophageal reflux disease without esophagitis 03/05/2023   Iron deficiency anemia 03/05/2023   Screening for malignant neoplasm of colon 03/05/2023   Generalized anxiety disorder 10/19/2022   Major depressive disorder, recurrent, moderate (HCC) 10/19/2022   Anemia of chronic disease 08/23/2022   Left knee pain 08/23/2022   Mixed hyperlipidemia 10/07/2021   Paroxysmal SVT (supraventricular tachycardia) (HCC) 10/07/2021   Allergic rhinitis 11/29/2020   Allergic rhinitis due to pollen 11/29/2020   Moderate persistent asthma, uncomplicated 11/29/2020   Osteoarthritis of spine with radiculopathy, lumbar region 08/24/2020   Chronic back pain 08/18/2020   Obesity,  diabetes, and hypertension syndrome (HCC) 02/27/2020   Vitreomacular adhesion of both eyes 02/09/2020   Pseudophakia of both eyes 02/09/2020   Chronic diastolic CHF (congestive heart failure) (HCC) 03/12/2019   Hypernatremia 03/09/2019   Suspected COVID-19 virus infection 03/09/2019   Thyroid mass 02/27/2017   Pain in right hip 05/24/2016   Effusion, right knee 04/28/2016   Presence of right artificial knee joint 04/28/2016   Abnormal LFTs    Decreased sensation    Paresthesia 06/12/2014   Obstruction of kidney 04/24/2014   Chronic anticoagulation 04/24/2014   Crack cocaine use 04/24/2014   Depression 04/24/2014   Fibromyalgia 04/24/2014   Obstructive sleep apnea on CPAP 04/24/2014   Lupus anticoagulant positive 04/24/2014   Adrenal mass, right (HCC) 04/24/2014   Right kidney mass 04/24/2014   Hydronephrosis of right kidney 04/24/2014   Supratherapeutic INR 04/24/2014   Renal mass 04/24/2014   Chronic obstructive pulmonary disease (HCC) 04/24/2014   Right lower quadrant abdominal pain    Morbid obesity (HCC) 08/21/2013   Constipation 08/19/2013   Lumbosacral spondylosis without myelopathy 11/19/2012   Morbid obesity with BMI of 45.0-49.9, adult (HCC) 01/05/2012   Bilateral deep vein thromboses (HCC) 12/13/2011   Hypokalemia 11/05/2011   Sciatica 11/05/2011   Essential hypertension 11/05/2011   Diabetes type 2, controlled (HCC) 11/05/2011   Past Medical History:  Diagnosis Date   Adrenal mass (HCC) 03/226   Benign   Arthritis    knees/multiple orthopedic conditons; lower back   Asthma    per pt  Clotting disorder (HCC)    +beta-2-glycoprotein IgA antibody   COPD (chronic obstructive pulmonary disease) (HCC)    inhalers dependent on environment   Depression    Diabetes mellitus    120s usually fasting -  dx more than 10 yrs ago   Dizziness, nonspecific    DVT (deep venous thrombosis) (HCC)    Recurrent   Fibromyalgia    GERD (gastroesophageal reflux disease)     Heart murmur    History of blood transfusion    History of cocaine abuse (HCC)    Remote history    Hyperlipidemia    Hypertension    takes meds daily   Hypothyroidism    Lupus Anticoagulant Positive    On home oxygen therapy    at night   Shortness of breath    exertion or lying flat   Sleep apnea    2l of oxygen at night (as of 12/6, she used to)   Past Surgical History:  Procedure Laterality Date   ABDOMINAL HYSTERECTOMY  1989   Fibroids   ANTERIOR LUMBAR FUSION  01/03/2012   Procedure: ANTERIOR LUMBAR FUSION 1 LEVEL;  Surgeon: Carmela Hurt, MD;  Location: [facility];  Service: Neurosurgery;  Laterality: N/A;  Lumbar Four-Five Anterior Lumbar Interbody Fusion with Instrumentation   BACK SURGERY     cervical spine---disk disease   CARPAL TUNNEL RELEASE     Bilateral   CESAREAN SECTION     CHOLECYSTECTOMY     CYSTOSCOPY/RETROGRADE/URETEROSCOPY/STONE EXTRACTION WITH BASKET Right 04/26/2014   Procedure: CYSTOSCOPY/ RIGHT RETROGRADE/ RIGHT URETEROSCOPY/URETERAL AND RENAL PELVIS BIOPSY;  Surgeon: Sebastian Ache, MD;  Location: [facility];  Service: Urology;  Laterality: Right;   ESOPHAGOGASTRODUODENOSCOPY (EGD) WITH PROPOFOL N/A 08/25/2022   Procedure: ESOPHAGOGASTRODUODENOSCOPY (EGD) WITH PROPOFOL;  Surgeon: Jeani Hawking, MD;  Location: [facility];  Service: Gastroenterology;  Laterality: N/A;   gallstones removed     HEMOSTASIS CLIP PLACEMENT  08/25/2022   Procedure: HEMOSTASIS CLIP PLACEMENT;  Surgeon: Jeani Hawking, MD;  Location: [facility];  Service: Gastroenterology;;   HOT HEMOSTASIS N/A 08/25/2022   Procedure: HOT HEMOSTASIS (ARGON PLASMA COAGULATION/BICAP);  Surgeon: Jeani Hawking, MD;  Location: [facility];  Service: Gastroenterology;  Laterality: N/A;   POLYPECTOMY  08/25/2022   Procedure: POLYPECTOMY;  Surgeon: Jeani Hawking, MD;  Location: [facility];  Service: Gastroenterology;;   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   bilateral   right elbow surgery     RIGHT HEART  CATH N/A 02/28/2021   Procedure: RIGHT HEART CATH;  Surgeon: Kathleene Hazel, MD;  Location: [facility];  Service: Cardiovascular;  Laterality: N/A;   THYROID LOBECTOMY Right 02/27/2017   THYROID LOBECTOMY Right 02/27/2017   Procedure: RIGHT THYROID LOBECTOMY;  Surgeon: Harriette Bouillon, MD;  Location: [facility];  Service: General;  Laterality: Right;   TUBAL LIGATION     Social History   Tobacco Use   Smoking status: Never   Smokeless tobacco: Never  Vaping Use   Vaping status: Never Used  Substance Use Topics   Alcohol use: No    Alcohol/week: 0.0 standard drinks of alcohol    Comment: beer in past    Drug use: Yes    Types: Cocaine    Comment: quit 2003-rehab progam in 2005   Family History  Problem Relation Age of Onset   Hypertension Mother    Diabetes Mother    Cancer Mother        Pancreatic   Hypertension Father    Heart  failure Father    Hyperlipidemia Other    COPD Other    Allergies  Allergen Reactions   Lisinopril Anaphylaxis, Swelling and Other (See Comments)    THROAT SWELLING  Pt states her throat started to "close up" after being on it for "so long."  THROAT SWELLING, Pt states her throat started to "close up" after being on it for "so long."   Prednisone Anaphylaxis, Swelling and Other (See Comments)    Throat swelling and tongue irritation   Spironolactone Other (See Comments)    Caused stroke-like symptoms with the mouth   Corticosteroids Rash    States she developed a rash on her tongue after a steroid injection in her back      ROS Negative unless stated above    Objective:     BP (!) 146/80 (BP Location: Left Arm, Patient Position: Sitting, Cuff Size: Normal)   Pulse 75   Temp 98.3 F (36.8 C) (Oral)   Ht 5\' 6"  (1.676 m)   Wt 284 lb 3.2 oz (128.9 kg)   SpO2 (!) 75%   BMI 45.87 kg/m  BP Readings from Last 3 Encounters:  06/11/23 (!) 146/80  04/04/23 (!) 132/98  03/05/23 (!) 160/80   Wt Readings from Last 3  Encounters:  06/11/23 284 lb 3.2 oz (128.9 kg)  05/30/23 283 lb (128.4 kg)  04/04/23 283 lb (128.4 kg)      Physical Exam Constitutional:      General: She is not in acute distress.    Appearance: Normal appearance.  HENT:     Head: Normocephalic and atraumatic.     Nose: Nose normal.     Mouth/Throat:     Mouth: Mucous membranes are moist.  Cardiovascular:     Rate and Rhythm: Normal rate and regular rhythm.     Heart sounds: Normal heart sounds. No murmur heard.    No gallop.  Pulmonary:     Effort: Pulmonary effort is normal. No respiratory distress.     Breath sounds: Normal breath sounds. No wheezing, rhonchi or rales.  Skin:    General: Skin is warm and dry.  Neurological:     Mental Status: She is alert and oriented to person, place, and time.     Results for orders placed or performed in visit on 06/11/23  POC Glucose (CBG)  Result Value Ref Range   POC Glucose 113 (A) 70 - 99 mg/dl      8/65/7846    9:62 PM 03/07/2023    6:12 PM 12/22/2022    8:39 AM  Depression screen PHQ 2/9  Decreased Interest 1 0 1  Down, Depressed, Hopeless 1 0 1  PHQ - 2 Score 2 0 2  Altered sleeping 1  1  Tired, decreased energy 1  3  Change in appetite 1  1  Feeling bad or failure about yourself  1  1  Trouble concentrating 1  1  Moving slowly or fidgety/restless 0  1  Suicidal thoughts 0  0  PHQ-9 Score 7  10  Difficult doing work/chores Somewhat difficult  Not difficult at all      06/11/2023    2:25 PM 12/22/2022    8:40 AM 09/06/2022   11:14 AM 04/20/2022    8:37 AM  GAD 7 : Generalized Anxiety Score  Nervous, Anxious, on Edge 1 1 1 1   Control/stop worrying 1 1 1 1   Worry too much - different things 1 1 1 1   Trouble relaxing 1 0  1 0  Restless 1 1 0 0  Easily annoyed or irritable 1 0 0 0  Afraid - awful might happen 0 1 1 0  Total GAD 7 Score 6 5 5 3   Anxiety Difficulty  Somewhat difficult Not difficult at all Somewhat difficult      Assessment & Plan:  Essential  hypertension -Elevated -Patient with lab will BP readings at home.  Difficult to say if patient is symptomatic with low normal readings in the 100-1 teens systolic as she is also having symptoms when BP is slightly above [place]. -Patient is not taking spironolactone 12.5 mg daily x 2 months as she did not know what the medication was for. -Patient advised to continue diltiazem CD2 140 mg daily, clonidine 0.1 mg twice daily -Lifestyle modification strongly encouraged -Continue to monitor BP at home -     Comprehensive metabolic panel; Future -     TSH; Future -     T4, free; Future  Chronic diastolic CHF (congestive heart failure) (HCC) -Euvolemic -Continue current medications including Lasix 80 mg twice daily, diltiazem 240 mg daily. -No longer taking spironolactone 12.5 mg daily x 2 months. -Encouraged to schedule follow-up with cardiology.  Type 2 diabetes mellitus with diabetic neuropathy, without long-term current use of insulin (HCC) -Hemoglobin A1c 7.5% on 12/04/2022. -No adjustments in medications made as patient is not checking blood sugar due to losing her glucometer.  Given Rx for new meter however may not be covered by insurance as requested a new meter December 2024. -     POCT glucose (manual entry) -     Blood Glucose Monitoring Suppl; 1 each by Does not apply route in the morning, at noon, and at bedtime. May substitute to any manufacturer covered by patient's insurance.  Dispense: 1 each; Refill: 0 -     Blood Glucose Test; 1 each by In Vitro route in the morning, at noon, and at bedtime. May substitute to any manufacturer covered by patient's insurance.  Dispense: 100 strip; Refill: 3 -     Lancet Device; 1 each by Does not apply route in the morning, at noon, and at bedtime. May substitute to any manufacturer covered by patient's insurance.  Dispense: 1 each; Refill: 0 -     Lancets Misc.; May substitute to any manufacturer covered by patient's insurance.  Dispense: 100 each;  Refill: 3  Vitamin D deficiency  Dizziness -Mild dizziness noted in clinic.  Blood sugar 113. -Several causes may be contributing to patient's symptoms include confusion with medications, diet, hypertension, hypo or hyperglycemia, atherosclerosis, hypokalemia, CVA, cerebrovascular disease.  Also consider adrenal gland dysfunction. -Obtain labs this visit.  Would strongly consider a.m. cortisol level.  Can arrange if needed after further detail from MRI available on mass of right adrenal gland. -Also consider repeat CT head without contrast.  Last CT head done 08/23/2022 with sequela of mild chronic microvascular ischemic change with chronic infarcts in the left basal ganglia. -     POCT glucose (manual entry) -     Iron, TIBC and Ferritin Panel; Future -     CBC with Differential/Platelet; Future -     Comprehensive metabolic panel; Future -     TSH; Future -     T4, free; Future  Right adrenal mass (HCC) Noted on CT 05/22/2023 abdomen and pelvis with contrast: Right adrenal gland mass measuring 3.1 x 2.4 cm in AP and transverse diameter.  Subtle enhancing nodular component within the inferior medial distribution of the  adrenal gland nodule measuring 9 x 10 mm in maximum diameter.  Majority of the mass has mean attenuation value of 143 Hounsfield units on the postcontrast images.  Findings could represent some adenoma that although appears normal size seems to have different enhancement characteristics on today's imaging.  Consider MRI characterization with MRI with and without contrast.  A round enhancing solid density in the right lower quadrant of the pelvis anteriorly measures 2.7 x 2.8 x 2.7 cm.  Adjacent to the right external iliac artery appears unchanged since prior examinations as the patient appears to have prior hysterectomy this could represent a right ovary and a brother anterior lower location. -Encouraged to keep upcoming MRI appointment for 06/23/2023. -     Comprehensive metabolic  panel; Future  Patient has difficulty distinguishing between medications -Patient brought pill pack to visit.  Inquires what various medications are for.  Advised that names and description of medications are printed on the other side of packaging.  Patient informs this provider that she has not been taking spironolactone 12.5 mg x 2 months as she did not know what the half tablet was for.  Return in about 4 weeks (around 07/09/2023).   [name], MD

## 2023-06-12 ENCOUNTER — Other Ambulatory Visit: Payer: Self-pay | Admitting: Family Medicine

## 2023-06-12 DIAGNOSIS — M797 Fibromyalgia: Secondary | ICD-10-CM

## 2023-06-12 LAB — CBC WITH DIFFERENTIAL/PLATELET
Basophils Absolute: 0.1 10*3/uL (ref 0.0–0.1)
Basophils Relative: 1.2 % (ref 0.0–3.0)
Eosinophils Absolute: 0.1 10*3/uL (ref 0.0–0.7)
Eosinophils Relative: 2.1 % (ref 0.0–5.0)
HCT: 38.1 % (ref 36.0–46.0)
Hemoglobin: 12.4 g/dL (ref 12.0–15.0)
Lymphocytes Relative: 36.5 % (ref 12.0–46.0)
Lymphs Abs: 2 10*3/uL (ref 0.7–4.0)
MCHC: 32.5 g/dL (ref 30.0–36.0)
MCV: 81.7 fl (ref 78.0–100.0)
Monocytes Absolute: 0.5 10*3/uL (ref 0.1–1.0)
Monocytes Relative: 8.8 % (ref 3.0–12.0)
Neutro Abs: 2.9 10*3/uL (ref 1.4–7.7)
Neutrophils Relative %: 51.4 % (ref 43.0–77.0)
Platelets: 340 10*3/uL (ref 150.0–400.0)
RBC: 4.66 Mil/uL (ref 3.87–5.11)
RDW: 18.3 % — ABNORMAL HIGH (ref 11.5–15.5)
WBC: 5.6 10*3/uL (ref 4.0–10.5)

## 2023-06-12 LAB — IRON,TIBC AND FERRITIN PANEL
%SAT: 19 % (ref 16–45)
Ferritin: 27 ng/mL (ref 16–288)
Iron: 60 ug/dL (ref 45–160)
TIBC: 317 ug/dL (ref 250–450)

## 2023-06-18 DIAGNOSIS — J449 Chronic obstructive pulmonary disease, unspecified: Secondary | ICD-10-CM | POA: Diagnosis not present

## 2023-06-19 ENCOUNTER — Other Ambulatory Visit: Payer: Self-pay | Admitting: Family Medicine

## 2023-06-23 ENCOUNTER — Other Ambulatory Visit

## 2023-06-25 ENCOUNTER — Other Ambulatory Visit: Payer: Self-pay | Admitting: Family Medicine

## 2023-06-25 DIAGNOSIS — J302 Other seasonal allergic rhinitis: Secondary | ICD-10-CM

## 2023-06-28 ENCOUNTER — Encounter: Payer: Self-pay | Admitting: Family Medicine

## 2023-07-04 ENCOUNTER — Telehealth: Payer: Self-pay

## 2023-07-04 NOTE — Progress Notes (Unsigned)
   07/04/2023  Patient ID: [name], female   DOB: 08/22/1955, 68 y.o.   MRN: [id number]  Contacted patient to reschedule her missed pharmacist f/u appt.  Patient agrees to appt on 07/18/23, will bring medications with her for full med review.  Patient mentions she never received BG meter sent in to pharmacy in March. Spoke with pharmacy, because she was enrolled in prescription syncing, they were waiting to fill with patient's next shipment later in month. Pharmacy filled and sending to patient today.  Carnell Christian, PharmD Clinical Pharmacist [phone]

## 2023-07-05 ENCOUNTER — Other Ambulatory Visit: Payer: Self-pay | Admitting: Family Medicine

## 2023-07-05 DIAGNOSIS — E1169 Type 2 diabetes mellitus with other specified complication: Secondary | ICD-10-CM

## 2023-07-11 ENCOUNTER — Ambulatory Visit (INDEPENDENT_AMBULATORY_CARE_PROVIDER_SITE_OTHER): Admitting: Family Medicine

## 2023-07-11 ENCOUNTER — Encounter: Payer: Self-pay | Admitting: Family Medicine

## 2023-07-11 ENCOUNTER — Other Ambulatory Visit: Payer: Self-pay | Admitting: Family Medicine

## 2023-07-11 VITALS — BP 124/80 | HR 92 | Temp 98.3°F | Ht 66.0 in | Wt 283.0 lb

## 2023-07-11 DIAGNOSIS — I10 Essential (primary) hypertension: Secondary | ICD-10-CM | POA: Diagnosis not present

## 2023-07-11 DIAGNOSIS — D500 Iron deficiency anemia secondary to blood loss (chronic): Secondary | ICD-10-CM | POA: Diagnosis not present

## 2023-07-11 DIAGNOSIS — R052 Subacute cough: Secondary | ICD-10-CM

## 2023-07-11 DIAGNOSIS — R439 Unspecified disturbances of smell and taste: Secondary | ICD-10-CM

## 2023-07-11 DIAGNOSIS — E559 Vitamin D deficiency, unspecified: Secondary | ICD-10-CM | POA: Diagnosis not present

## 2023-07-11 DIAGNOSIS — Z7984 Long term (current) use of oral hypoglycemic drugs: Secondary | ICD-10-CM | POA: Diagnosis not present

## 2023-07-11 DIAGNOSIS — R5383 Other fatigue: Secondary | ICD-10-CM

## 2023-07-11 DIAGNOSIS — R112 Nausea with vomiting, unspecified: Secondary | ICD-10-CM

## 2023-07-11 DIAGNOSIS — K5904 Chronic idiopathic constipation: Secondary | ICD-10-CM | POA: Diagnosis not present

## 2023-07-11 DIAGNOSIS — E1140 Type 2 diabetes mellitus with diabetic neuropathy, unspecified: Secondary | ICD-10-CM | POA: Diagnosis not present

## 2023-07-11 DIAGNOSIS — M47817 Spondylosis without myelopathy or radiculopathy, lumbosacral region: Secondary | ICD-10-CM

## 2023-07-11 LAB — CBC WITH DIFFERENTIAL/PLATELET
Basophils Absolute: 0 10*3/uL (ref 0.0–0.1)
Basophils Relative: 0.4 % (ref 0.0–3.0)
Eosinophils Absolute: 0.2 10*3/uL (ref 0.0–0.7)
Eosinophils Relative: 3.1 % (ref 0.0–5.0)
HCT: 36 % (ref 36.0–46.0)
Hemoglobin: 11.6 g/dL — ABNORMAL LOW (ref 12.0–15.0)
Lymphocytes Relative: 24.2 % (ref 12.0–46.0)
Lymphs Abs: 1.6 10*3/uL (ref 0.7–4.0)
MCHC: 32.1 g/dL (ref 30.0–36.0)
MCV: 81.4 fl (ref 78.0–100.0)
Monocytes Absolute: 0.5 10*3/uL (ref 0.1–1.0)
Monocytes Relative: 7.8 % (ref 3.0–12.0)
Neutro Abs: 4.2 10*3/uL (ref 1.4–7.7)
Neutrophils Relative %: 64.5 % (ref 43.0–77.0)
Platelets: 344 10*3/uL (ref 150.0–400.0)
RBC: 4.42 Mil/uL (ref 3.87–5.11)
RDW: 18.4 % — ABNORMAL HIGH (ref 11.5–15.5)
WBC: 6.6 10*3/uL (ref 4.0–10.5)

## 2023-07-11 LAB — TSH: TSH: 4.13 u[IU]/mL (ref 0.35–5.50)

## 2023-07-11 LAB — COMPREHENSIVE METABOLIC PANEL WITH GFR
ALT: 12 U/L (ref 0–35)
AST: 12 U/L (ref 0–37)
Albumin: 4.2 g/dL (ref 3.5–5.2)
Alkaline Phosphatase: 81 U/L (ref 39–117)
BUN: 18 mg/dL (ref 6–23)
CO2: 26 meq/L (ref 19–32)
Calcium: 9.1 mg/dL (ref 8.4–10.5)
Chloride: 102 meq/L (ref 96–112)
Creatinine, Ser: 0.98 mg/dL (ref 0.40–1.20)
GFR: 59.48 mL/min — ABNORMAL LOW (ref >60.00)
Glucose, Bld: 125 mg/dL — ABNORMAL HIGH (ref 70–99)
Potassium: 4 meq/L (ref 3.5–5.1)
Sodium: 139 meq/L (ref 135–145)
Total Bilirubin: 0.3 mg/dL (ref 0.2–1.2)
Total Protein: 8.3 g/dL (ref 6.0–8.3)

## 2023-07-11 LAB — POCT GLYCOSYLATED HEMOGLOBIN (HGB A1C): Hemoglobin A1C: 6.2 % — AB (ref 4.0–5.6)

## 2023-07-11 LAB — VITAMIN D 25 HYDROXY (VIT D DEFICIENCY, FRACTURES): VITD: 19.87 ng/mL — ABNORMAL LOW (ref 30.00–100.00)

## 2023-07-11 LAB — C-REACTIVE PROTEIN: CRP: 3.4 mg/dL (ref 0.5–20.0)

## 2023-07-11 LAB — VITAMIN B12: Vitamin B-12: 291 pg/mL (ref 211–911)

## 2023-07-11 LAB — BRAIN NATRIURETIC PEPTIDE: Pro B Natriuretic peptide (BNP): 18 pg/mL (ref 0.0–100.0)

## 2023-07-11 LAB — T4, FREE: Free T4: 0.59 ng/dL — ABNORMAL LOW (ref 0.60–1.60)

## 2023-07-11 LAB — POC COVID19 BINAXNOW: SARS Coronavirus 2 Ag: NEGATIVE

## 2023-07-11 LAB — FOLATE: Folate: 10.5 ng/mL (ref >5.9)

## 2023-07-11 NOTE — Progress Notes (Signed)
 Established Patient Office Visit   Subjective  Patient ID: [name], female    DOB: 1955-05-19  Age: 68 y.o. MRN: [id number]  Chief Complaint  Patient presents with   Cough    Patient came in today for cough at night, started 3 weeks ago, wheezing and tired, patient would like labs done to check blood level    Patient is a 68 year old female seen for acute concern.  Patient endorses cough x 3 weeks worse at night with wheezing.  States "feels like something is in her chest".  Patient also notes fatigue, taste of blood in mouth and tongue feeling sore but vision is normal.  Patient notes her tongue has fissures on it.  Patient also endorses loss of smell and taste last week, nausea, vomiting, dizziness.Aaron Aas  COVID test was negative at home.  Patient endorses following at the end of last month.  Patient took Linzess  and had to use an enema for continued constipation.  Patient lives in [place].  States AC has been out x 3 days.  Maintenance man came and said he would return but has yet to do so.  Patient states there is no phone number to contact the office in regards to this.  Using a fan.    Patient Active Problem List   Diagnosis Date Noted   Type 2 diabetes mellitus without complications (HCC) 03/05/2023   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   Chronic idiopathic constipation 03/05/2023   Gastro-esophageal reflux disease without esophagitis 03/05/2023   Iron  deficiency anemia 03/05/2023   Screening for malignant neoplasm of colon 03/05/2023   Generalized anxiety disorder 10/19/2022   Major depressive disorder, recurrent, moderate (HCC) 10/19/2022   Anemia of chronic disease 08/23/2022   Left knee pain 08/23/2022   Mixed hyperlipidemia 10/07/2021   Paroxysmal SVT (supraventricular tachycardia) (HCC) 10/07/2021   Allergic rhinitis 11/29/2020   Allergic rhinitis due to pollen 11/29/2020   Moderate persistent asthma without complication 11/29/2020   Osteoarthritis of  spine with radiculopathy, lumbar region 08/24/2020   Chronic back pain 08/18/2020   Obesity, diabetes, and hypertension syndrome (HCC) 02/27/2020   Vitreomacular adhesion of both eyes 02/09/2020   Pseudophakia of both eyes 02/09/2020   Chronic diastolic CHF (congestive heart failure) (HCC) 03/12/2019   Hypernatremia 03/09/2019   Suspected COVID-19 virus infection 03/09/2019   Thyroid  mass 02/27/2017   Pain in right hip 05/24/2016   Effusion, right knee 04/28/2016   Presence of right artificial knee joint 04/28/2016   Abnormal LFTs    Decreased sensation    Paresthesia 06/12/2014   Obstruction of kidney 04/24/2014   Chronic anticoagulation 04/24/2014   Crack cocaine use 04/24/2014   Depression 04/24/2014   Fibromyalgia 04/24/2014   Obstructive sleep apnea on CPAP 04/24/2014   Lupus anticoagulant positive 04/24/2014   Adrenal mass, right (HCC) 04/24/2014   Right kidney mass 04/24/2014   Hydronephrosis of right kidney 04/24/2014   Supratherapeutic INR 04/24/2014   Renal mass 04/24/2014   Chronic obstructive pulmonary disease (HCC) 04/24/2014   Right lower quadrant abdominal pain    Morbid obesity (HCC) 08/21/2013   Constipation 08/19/2013   Lumbosacral spondylosis without myelopathy 11/19/2012   Morbid obesity with BMI of 45.0-49.9, adult (HCC) 01/05/2012   Bilateral deep vein thromboses (HCC) 12/13/2011   Hypokalemia 11/05/2011   Sciatica 11/05/2011   Essential hypertension 11/05/2011   Diabetes type 2, controlled (HCC) 11/05/2011   Past Medical History:  Diagnosis Date   Adrenal mass (HCC) 03/226   Benign  Arthritis    knees/multiple orthopedic conditons; lower back   Asthma    per pt   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   COPD (chronic obstructive pulmonary disease) (HCC)    inhalers dependent on environment   Depression    Diabetes mellitus    120s usually fasting -  dx more than 10 yrs ago   Dizziness, nonspecific    DVT (deep venous  thrombosis) (HCC)    Recurrent   Fibromyalgia    GERD (gastroesophageal reflux disease)    Heart murmur    History of blood transfusion    History of cocaine abuse (HCC)    Remote history    Hyperlipidemia    Hypertension    takes meds daily   Hypothyroidism    Lupus Anticoagulant Positive    On home oxygen  therapy    at night   Shortness of breath    exertion or lying flat   Sleep apnea    2l of oxygen  at night (as of 12/6, she used to)   Past Surgical History:  Procedure Laterality Date   ABDOMINAL HYSTERECTOMY  1989   Fibroids   ANTERIOR LUMBAR FUSION  01/03/2012   Procedure: ANTERIOR LUMBAR FUSION 1 LEVEL;  Surgeon: Pasty Bongo, MD;  Location: [facility];  Service: Neurosurgery;  Laterality: N/A;  Lumbar Four-Five Anterior Lumbar Interbody Fusion with Instrumentation   BACK SURGERY     cervical spine---disk disease   CARPAL TUNNEL RELEASE     Bilateral   CESAREAN SECTION     CHOLECYSTECTOMY     CYSTOSCOPY/RETROGRADE/URETEROSCOPY/STONE EXTRACTION WITH BASKET Right 04/26/2014   Procedure: CYSTOSCOPY/ RIGHT RETROGRADE/ RIGHT URETEROSCOPY/URETERAL AND RENAL PELVIS BIOPSY;  Surgeon: Osborn Blaze, MD;  Location: [facility];  Service: Urology;  Laterality: Right;   ESOPHAGOGASTRODUODENOSCOPY (EGD) WITH PROPOFOL  N/A 08/25/2022   Procedure: ESOPHAGOGASTRODUODENOSCOPY (EGD) WITH PROPOFOL ;  Surgeon: Alvis Jourdain, MD;  Location: [facility];  Service: Gastroenterology;  Laterality: N/A;   gallstones removed     HEMOSTASIS CLIP PLACEMENT  08/25/2022   Procedure: HEMOSTASIS CLIP PLACEMENT;  Surgeon: Alvis Jourdain, MD;  Location: [facility];  Service: Gastroenterology;;   HOT HEMOSTASIS N/A 08/25/2022   Procedure: HOT HEMOSTASIS (ARGON PLASMA COAGULATION/BICAP);  Surgeon: Alvis Jourdain, MD;  Location: [facility];  Service: Gastroenterology;  Laterality: N/A;   POLYPECTOMY  08/25/2022   Procedure: POLYPECTOMY;  Surgeon: Alvis Jourdain, MD;  Location: [facility];  Service:  Gastroenterology;;   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   bilateral   right elbow surgery     RIGHT HEART CATH N/A 02/28/2021   Procedure: RIGHT HEART CATH;  Surgeon: Odie Benne, MD;  Location: [facility];  Service: Cardiovascular;  Laterality: N/A;   THYROID  LOBECTOMY Right 02/27/2017   THYROID  LOBECTOMY Right 02/27/2017   Procedure: RIGHT THYROID  LOBECTOMY;  Surgeon: Sim Dryer, MD;  Location: [facility];  Service: General;  Laterality: Right;   TUBAL LIGATION     Social History   Tobacco Use   Smoking status: Never   Smokeless tobacco: Never  Vaping Use   Vaping status: Never Used  Substance Use Topics   Alcohol use: No    Alcohol/week: 0.0 standard drinks of alcohol    Comment: beer in past    Drug use: Yes    Types: Cocaine    Comment: quit 2003-rehab progam in 2005   Family History  Problem Relation Age of Onset   Hypertension Mother    Diabetes Mother  Cancer Mother        Pancreatic   Hypertension Father    Heart failure Father    Hyperlipidemia Other    COPD Other    Allergies  Allergen Reactions   Lisinopril  Anaphylaxis, Swelling and Other (See Comments)    THROAT SWELLING  Pt states her throat started to "close up" after being on it for "so long."  THROAT SWELLING, Pt states her throat started to "close up" after being on it for "so long."   Prednisone  Anaphylaxis, Swelling and Other (See Comments)    Throat swelling and tongue irritation   Spironolactone  Other (See Comments)    Caused stroke-like symptoms with the mouth   Corticosteroids Rash    States she developed a rash on her tongue after a steroid injection in her back      ROS Negative unless stated above    Objective:     BP 124/80 (BP Location: Left Arm, Patient Position: Sitting, Cuff Size: Large)   Pulse 92   Temp 98.3 F (36.8 C) (Oral)   Ht 5\' 6"  (1.676 m)   Wt 283 lb (128.4 kg)   SpO2 96%   BMI 45.68 kg/m  BP Readings from Last 3 Encounters:   07/11/23 124/80  06/11/23 (!) 146/80  04/04/23 (!) 132/98   Wt Readings from Last 3 Encounters:  07/11/23 283 lb (128.4 kg)  06/11/23 284 lb 3.2 oz (128.9 kg)  05/30/23 283 lb (128.4 kg)      Physical Exam Constitutional:      General: She is not in acute distress.    Appearance: Normal appearance.  HENT:     Head: Normocephalic and atraumatic.     Nose: Nose normal.     Mouth/Throat:     Mouth: Mucous membranes are moist.  Cardiovascular:     Rate and Rhythm: Normal rate and regular rhythm.     Heart sounds: Normal heart sounds. No murmur heard.    No gallop.  Pulmonary:     Effort: Pulmonary effort is normal. No respiratory distress.     Breath sounds: Normal breath sounds. No wheezing, rhonchi or rales.  Skin:    General: Skin is warm and dry.  Neurological:     Mental Status: She is alert and oriented to person, place, and time.      Results for orders placed or performed in visit on 07/11/23  POC HgB A1c  Result Value Ref Range   Hemoglobin A1C 6.2 (A) 4.0 - 5.6 %   HbA1c POC (<> result, manual entry)     HbA1c, POC (prediabetic range)     HbA1c, POC (controlled diabetic range)        Assessment & Plan:  Fatigue, unspecified type -     Vitamin B12; Future -     Folate; Future -     CBC with Differential/Platelet; Future -     Iron , TIBC and Ferritin Panel; Future -     C-reactive protein; Future -     VITAMIN D  25 Hydroxy (Vit-D Deficiency, Fractures); Future -     POC COVID-19 BinaxNow  Subacute cough -     POC COVID-19 BinaxNow  Nausea and vomiting, unspecified vomiting type -     CBC with Differential/Platelet; Future -     Brain natriuretic peptide; Future -     POC COVID-19 BinaxNow  Smell and taste disorder -     POC COVID-19 BinaxNow  Iron  deficiency anemia due to chronic blood loss -  CBC with Differential/Platelet; Future -     Iron , TIBC and Ferritin Panel; Future  Essential hypertension -     Comprehensive metabolic panel  with GFR; Future -     TSH; Future -     T4, free; Future  Chronic idiopathic constipation  Vitamin D  deficiency -     VITAMIN D  25 Hydroxy (Vit-D Deficiency, Fractures); Future  Type 2 diabetes mellitus with diabetic neuropathy, without long-term current use of insulin  (HCC) -     POCT glycosylated hemoglobin (Hb A1C)  Patient with several acute symptoms concerning for viral etiology, worsening of chronic conditions such as CHF exacerbation or hyperglycemia.  DM well-controlled as Hemoglobin A1c this visit 6.2%.  Also consider vitamin or electrolyte abnormality.  Will obtain labs.  Unable to locate a phone number for the office at [facility] in regards to patient's [facility] being out.  Patient given strict precautions.  Return in about 6 weeks (around 08/22/2023), or if symptoms worsen or fail to improve.   Viola Greulich, MD

## 2023-07-12 LAB — IRON,TIBC AND FERRITIN PANEL
%SAT: 17 % (ref 16–45)
Ferritin: 43 ng/mL (ref 16–288)
Iron: 51 ug/dL (ref 45–160)
TIBC: 307 ug/dL (ref 250–450)

## 2023-07-13 ENCOUNTER — Encounter: Payer: Self-pay | Admitting: Family Medicine

## 2023-07-13 ENCOUNTER — Other Ambulatory Visit: Payer: Self-pay | Admitting: Family Medicine

## 2023-07-13 DIAGNOSIS — N179 Acute kidney failure, unspecified: Secondary | ICD-10-CM

## 2023-07-13 DIAGNOSIS — E559 Vitamin D deficiency, unspecified: Secondary | ICD-10-CM

## 2023-07-13 MED ORDER — VITAMIN D (ERGOCALCIFEROL) 1.25 MG (50000 UNIT) PO CAPS
50000.0000 [IU] | ORAL_CAPSULE | ORAL | 0 refills | Status: DC
Start: 2023-07-13 — End: 2023-07-28

## 2023-07-18 ENCOUNTER — Ambulatory Visit

## 2023-07-18 ENCOUNTER — Ambulatory Visit (INDEPENDENT_AMBULATORY_CARE_PROVIDER_SITE_OTHER): Admitting: Family Medicine

## 2023-07-18 ENCOUNTER — Encounter: Payer: Self-pay | Admitting: Family Medicine

## 2023-07-18 VITALS — BP 136/84 | HR 87 | Temp 98.9°F | Ht 66.0 in | Wt 286.6 lb

## 2023-07-18 DIAGNOSIS — D649 Anemia, unspecified: Secondary | ICD-10-CM

## 2023-07-18 DIAGNOSIS — E559 Vitamin D deficiency, unspecified: Secondary | ICD-10-CM | POA: Diagnosis not present

## 2023-07-18 DIAGNOSIS — N179 Acute kidney failure, unspecified: Secondary | ICD-10-CM | POA: Diagnosis not present

## 2023-07-18 DIAGNOSIS — K144 Atrophy of tongue papillae: Secondary | ICD-10-CM | POA: Diagnosis not present

## 2023-07-18 DIAGNOSIS — R58 Hemorrhage, not elsewhere classified: Secondary | ICD-10-CM | POA: Diagnosis not present

## 2023-07-18 MED ORDER — STERILE WATER FOR INJECTION IJ SOLN: 5.0000 mL | Freq: Four times a day (QID) | OROMUCOSAL | 0 refills | Status: DC | PRN

## 2023-07-18 NOTE — Progress Notes (Signed)
 Established Patient Office Visit   Subjective  Patient ID: [name], female    DOB: 05-21-1955  Age: 68 y.o. MRN: [id number]  Chief Complaint  Patient presents with   Bleeding/Bruising    Right shoulder with knot   Oral Pain    Mouth burns when eating      Patient is a 68 year old female seen for acute concerns.  Patient endorses continued issues with tongue.  Patient states now tongue is burning when eating.  Still has fissures on surface.  Patient denies changes in foods, medications, etc.  Patient also notes bruise on right upper arm.  Noted a few nights ago as our was sore.  Patient denies any injury, falls, insect bites.  Recent labs largely negative with exception of mild anemia, vitamin D  deficiency, and AKI.  Patient states she was finally able to get her air conditioning repaired.    Patient Active Problem List   Diagnosis Date Noted   Type 2 diabetes mellitus without complications (HCC) 03/05/2023   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   Chronic idiopathic constipation 03/05/2023   Gastro-esophageal reflux disease without esophagitis 03/05/2023   Iron  deficiency anemia 03/05/2023   Screening for malignant neoplasm of colon 03/05/2023   Generalized anxiety disorder 10/19/2022   Major depressive disorder, recurrent, moderate (HCC) 10/19/2022   Anemia of chronic disease 08/23/2022   Left knee pain 08/23/2022   Mixed hyperlipidemia 10/07/2021   Paroxysmal SVT (supraventricular tachycardia) (HCC) 10/07/2021   Allergic rhinitis 11/29/2020   Allergic rhinitis due to pollen 11/29/2020   Moderate persistent asthma without complication 11/29/2020   Osteoarthritis of spine with radiculopathy, lumbar region 08/24/2020   Chronic back pain 08/18/2020   Obesity, diabetes, and hypertension syndrome (HCC) 02/27/2020   Vitreomacular adhesion of both eyes 02/09/2020   Pseudophakia of both eyes 02/09/2020   Chronic diastolic CHF (congestive heart failure) (HCC)  03/12/2019   Hypernatremia 03/09/2019   Suspected COVID-19 virus infection 03/09/2019   Thyroid  mass 02/27/2017   Pain in right hip 05/24/2016   Effusion, right knee 04/28/2016   Presence of right artificial knee joint 04/28/2016   Abnormal LFTs    Decreased sensation    Paresthesia 06/12/2014   Obstruction of kidney 04/24/2014   Chronic anticoagulation 04/24/2014   Crack cocaine use 04/24/2014   Depression 04/24/2014   Fibromyalgia 04/24/2014   Obstructive sleep apnea on CPAP 04/24/2014   Lupus anticoagulant positive 04/24/2014   Adrenal mass, right (HCC) 04/24/2014   Right kidney mass 04/24/2014   Hydronephrosis of right kidney 04/24/2014   Supratherapeutic INR 04/24/2014   Renal mass 04/24/2014   Chronic obstructive pulmonary disease (HCC) 04/24/2014   Right lower quadrant abdominal pain    Morbid obesity (HCC) 08/21/2013   Constipation 08/19/2013   Lumbosacral spondylosis without myelopathy 11/19/2012   Morbid obesity with BMI of 45.0-49.9, adult (HCC) 01/05/2012   Bilateral deep vein thromboses (HCC) 12/13/2011   Hypokalemia 11/05/2011   Sciatica 11/05/2011   Essential hypertension 11/05/2011   Diabetes type 2, controlled (HCC) 11/05/2011   Past Medical History:  Diagnosis Date   Adrenal mass (HCC) 03/226   Benign   Arthritis    knees/multiple orthopedic conditons; lower back   Asthma    per pt   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   COPD (chronic obstructive pulmonary disease) (HCC)    inhalers dependent on environment   Depression    Diabetes mellitus    120s usually fasting -  dx more than 10  yrs ago   Dizziness, nonspecific    DVT (deep venous thrombosis) (HCC)    Recurrent   Fibromyalgia    GERD (gastroesophageal reflux disease)    Heart murmur    History of blood transfusion    History of cocaine abuse (HCC)    Remote history    Hyperlipidemia    Hypertension    takes meds daily   Hypothyroidism    Lupus Anticoagulant  Positive    On home oxygen  therapy    at night   Shortness of breath    exertion or lying flat   Sleep apnea    2l of oxygen  at night (as of 12/6, she used to)   Past Surgical History:  Procedure Laterality Date   ABDOMINAL HYSTERECTOMY  1989   Fibroids   ANTERIOR LUMBAR FUSION  01/03/2012   Procedure: ANTERIOR LUMBAR FUSION 1 LEVEL;  Surgeon: Pasty Bongo, MD;  Location: [facility];  Service: Neurosurgery;  Laterality: N/A;  Lumbar Four-Five Anterior Lumbar Interbody Fusion with Instrumentation   BACK SURGERY     cervical spine---disk disease   CARPAL TUNNEL RELEASE     Bilateral   CESAREAN SECTION     CHOLECYSTECTOMY     CYSTOSCOPY/RETROGRADE/URETEROSCOPY/STONE EXTRACTION WITH BASKET Right 04/26/2014   Procedure: CYSTOSCOPY/ RIGHT RETROGRADE/ RIGHT URETEROSCOPY/URETERAL AND RENAL PELVIS BIOPSY;  Surgeon: Osborn Blaze, MD;  Location: [facility];  Service: Urology;  Laterality: Right;   ESOPHAGOGASTRODUODENOSCOPY (EGD) WITH PROPOFOL  N/A 08/25/2022   Procedure: ESOPHAGOGASTRODUODENOSCOPY (EGD) WITH PROPOFOL ;  Surgeon: Alvis Jourdain, MD;  Location: [facility];  Service: Gastroenterology;  Laterality: N/A;   gallstones removed     HEMOSTASIS CLIP PLACEMENT  08/25/2022   Procedure: HEMOSTASIS CLIP PLACEMENT;  Surgeon: Alvis Jourdain, MD;  Location: [facility];  Service: Gastroenterology;;   HOT HEMOSTASIS N/A 08/25/2022   Procedure: HOT HEMOSTASIS (ARGON PLASMA COAGULATION/BICAP);  Surgeon: Alvis Jourdain, MD;  Location: [facility];  Service: Gastroenterology;  Laterality: N/A;   POLYPECTOMY  08/25/2022   Procedure: POLYPECTOMY;  Surgeon: Alvis Jourdain, MD;  Location: [facility];  Service: Gastroenterology;;   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   bilateral   right elbow surgery     RIGHT HEART CATH N/A 02/28/2021   Procedure: RIGHT HEART CATH;  Surgeon: Odie Benne, MD;  Location: [facility]VASIVE CV LAB;  Service: Cardiovascular;  Laterality: N/A;   THYROID  LOBECTOMY Right  02/27/2017   THYROID  LOBECTOMY Right 02/27/2017   Procedure: RIGHT THYROID  LOBECTOMY;  Surgeon: Sim Dryer, MD;  Location: [facility];  Service: General;  Laterality: Right;   TUBAL LIGATION     Social History   Tobacco Use   Smoking status: Never   Smokeless tobacco: Never  Vaping Use   Vaping status: Never Used  Substance Use Topics   Alcohol use: No    Alcohol/week: 0.0 standard drinks of alcohol    Comment: beer in past    Drug use: Yes    Types: Cocaine    Comment: quit 2003-rehab progam in 2005   Family History  Problem Relation Age of Onset   Hypertension Mother    Diabetes Mother    Cancer Mother        Pancreatic   Hypertension Father    Heart failure Father    Hyperlipidemia Other    COPD Other    Allergies  Allergen Reactions   Lisinopril  Anaphylaxis, Swelling and Other (See Comments)    THROAT SWELLING  Pt states her throat started to "close up" after being  on it for "so long."  THROAT SWELLING, Pt states her throat started to "close up" after being on it for "so long."   Prednisone  Anaphylaxis, Swelling and Other (See Comments)    Throat swelling and tongue irritation   Spironolactone  Other (See Comments)    Caused stroke-like symptoms with the mouth   Corticosteroids Rash    States she developed a rash on her tongue after a steroid injection in her back      ROS Negative unless stated above    Objective:     BP 136/84 (BP Location: Left Arm, Patient Position: Sitting, Cuff Size: Large)   Pulse 87   Temp 98.9 F (37.2 C) (Oral)   Ht 5\' 6"  (1.676 m)   Wt 286 lb 9.6 oz (130 kg)   SpO2 94%   BMI 46.26 kg/m  BP Readings from Last 3 Encounters:  07/18/23 136/84  07/11/23 124/80  06/11/23 (!) 146/80   Wt Readings from Last 3 Encounters:  07/18/23 286 lb 9.6 oz (130 kg)  07/11/23 283 lb (128.4 kg)  06/11/23 284 lb 3.2 oz (128.9 kg)      Physical Exam Constitutional:      General: She is not in acute distress.    Appearance: Normal  appearance.  HENT:     Head: Normocephalic and atraumatic.     Nose: Nose normal.     Mouth/Throat:     Mouth: Mucous membranes are moist.     Comments: Tongue with fissures, faint whitish appearance.  No edema or increased redness noted. Cardiovascular:     Rate and Rhythm: Normal rate and regular rhythm.     Heart sounds: Normal heart sounds. No murmur heard.    No gallop.  Pulmonary:     Effort: Pulmonary effort is normal. No respiratory distress.     Breath sounds: Normal breath sounds. No wheezing, rhonchi or rales.  Skin:    General: Skin is warm and dry.     Findings: Ecchymosis present.     Comments: Ecchymosis on right upper arm with an area of central clearing with a erythematous punctum appearing area in the center.  Neurological:     Mental Status: She is alert and oriented to person, place, and time.    No results found for any visits on 07/18/23.    Assessment & Plan:  Atrophic glossitis -     magic mouthwash (multi-ingredient) oral suspension; Swish and spit 5 mLs 4 (four) times daily as needed for mouth pain.  Dispense: 200 mL; Refill: 0  Ecchymosis  Vitamin D  deficiency  Mild anemia  AKI (acute kidney injury) (HCC)   Start Magic mouthwash swish and spit 4 times daily as needed for atrophic glossitis possibly 2/2 thrush.  Patient to start OTC iron  supplement for mild anemia as hemoglobin 11.6 on labs from 07/11/2023.  Patient to pick up Rx for ergocalciferol  as vitamin D  was low at 19.87 on 07/11/2023.  Recheck renal function as GFR was 59.48 on 07/11/2023, previously 64.2 on 06/11/2023.  Discussed possible causes of ecchymosis including insect bite giving appearance versus bumping into something.  Discussed supportive care including ice, heat, time.  LFTs normal 07/11/2023.  Return if symptoms worsen or fail to improve.   Viola Greulich, MD

## 2023-07-22 ENCOUNTER — Inpatient Hospital Stay (HOSPITAL_COMMUNITY)
Admission: EM | Admit: 2023-07-22 | Discharge: 2023-07-26 | DRG: 815 | Disposition: A | Discharge status: Home Health | Attending: Internal Medicine | Admitting: Internal Medicine

## 2023-07-22 ENCOUNTER — Other Ambulatory Visit: Payer: Self-pay

## 2023-07-22 ENCOUNTER — Emergency Department (HOSPITAL_COMMUNITY)

## 2023-07-22 DIAGNOSIS — Z9981 Dependence on supplemental oxygen: Secondary | ICD-10-CM

## 2023-07-22 DIAGNOSIS — Z825 Family history of asthma and other chronic lower respiratory diseases: Secondary | ICD-10-CM

## 2023-07-22 DIAGNOSIS — Z96653 Presence of artificial knee joint, bilateral: Secondary | ICD-10-CM | POA: Diagnosis present

## 2023-07-22 DIAGNOSIS — Z800 Family history of malignant neoplasm of digestive organs: Secondary | ICD-10-CM

## 2023-07-22 DIAGNOSIS — Z6841 Body Mass Index (BMI) 40.0 and over, adult: Secondary | ICD-10-CM

## 2023-07-22 DIAGNOSIS — Z86718 Personal history of other venous thrombosis and embolism: Secondary | ICD-10-CM

## 2023-07-22 DIAGNOSIS — D6862 Lupus anticoagulant syndrome: Secondary | ICD-10-CM | POA: Diagnosis not present

## 2023-07-22 DIAGNOSIS — G4730 Sleep apnea, unspecified: Secondary | ICD-10-CM | POA: Diagnosis present

## 2023-07-22 DIAGNOSIS — I5032 Chronic diastolic (congestive) heart failure: Secondary | ICD-10-CM | POA: Diagnosis not present

## 2023-07-22 DIAGNOSIS — S8011XA Contusion of right lower leg, initial encounter: Secondary | ICD-10-CM | POA: Diagnosis present

## 2023-07-22 DIAGNOSIS — Z7985 Long-term (current) use of injectable non-insulin antidiabetic drugs: Secondary | ICD-10-CM

## 2023-07-22 DIAGNOSIS — D62 Acute posthemorrhagic anemia: Secondary | ICD-10-CM | POA: Diagnosis not present

## 2023-07-22 DIAGNOSIS — J309 Allergic rhinitis, unspecified: Secondary | ICD-10-CM | POA: Diagnosis present

## 2023-07-22 DIAGNOSIS — Z96651 Presence of right artificial knee joint: Secondary | ICD-10-CM | POA: Diagnosis not present

## 2023-07-22 DIAGNOSIS — R42 Dizziness and giddiness: Secondary | ICD-10-CM

## 2023-07-22 DIAGNOSIS — Z860100 Personal history of colon polyps, unspecified: Secondary | ICD-10-CM

## 2023-07-22 DIAGNOSIS — R791 Abnormal coagulation profile: Secondary | ICD-10-CM | POA: Diagnosis not present

## 2023-07-22 DIAGNOSIS — M79606 Pain in leg, unspecified: Secondary | ICD-10-CM | POA: Diagnosis not present

## 2023-07-22 DIAGNOSIS — D649 Anemia, unspecified: Secondary | ICD-10-CM | POA: Diagnosis not present

## 2023-07-22 DIAGNOSIS — M79604 Pain in right leg: Principal | ICD-10-CM | POA: Diagnosis present

## 2023-07-22 DIAGNOSIS — M19011 Primary osteoarthritis, right shoulder: Secondary | ICD-10-CM | POA: Diagnosis not present

## 2023-07-22 DIAGNOSIS — K219 Gastro-esophageal reflux disease without esophagitis: Secondary | ICD-10-CM | POA: Diagnosis not present

## 2023-07-22 DIAGNOSIS — I110 Hypertensive heart disease with heart failure: Secondary | ICD-10-CM | POA: Diagnosis present

## 2023-07-22 DIAGNOSIS — E782 Mixed hyperlipidemia: Secondary | ICD-10-CM | POA: Diagnosis not present

## 2023-07-22 DIAGNOSIS — E1165 Type 2 diabetes mellitus with hyperglycemia: Secondary | ICD-10-CM

## 2023-07-22 DIAGNOSIS — Z8249 Family history of ischemic heart disease and other diseases of the circulatory system: Secondary | ICD-10-CM | POA: Diagnosis not present

## 2023-07-22 DIAGNOSIS — Z8709 Personal history of other diseases of the respiratory system: Secondary | ICD-10-CM | POA: Diagnosis not present

## 2023-07-22 DIAGNOSIS — E1142 Type 2 diabetes mellitus with diabetic polyneuropathy: Secondary | ICD-10-CM | POA: Diagnosis present

## 2023-07-22 DIAGNOSIS — J4489 Other specified chronic obstructive pulmonary disease: Secondary | ICD-10-CM | POA: Diagnosis present

## 2023-07-22 DIAGNOSIS — Z7984 Long term (current) use of oral hypoglycemic drugs: Secondary | ICD-10-CM

## 2023-07-22 DIAGNOSIS — Z7951 Long term (current) use of inhaled steroids: Secondary | ICD-10-CM | POA: Diagnosis not present

## 2023-07-22 DIAGNOSIS — E119 Type 2 diabetes mellitus without complications: Secondary | ICD-10-CM

## 2023-07-22 DIAGNOSIS — E785 Hyperlipidemia, unspecified: Secondary | ICD-10-CM | POA: Diagnosis present

## 2023-07-22 DIAGNOSIS — F32A Depression, unspecified: Secondary | ICD-10-CM | POA: Diagnosis present

## 2023-07-22 DIAGNOSIS — Z888 Allergy status to other drugs, medicaments and biological substances status: Secondary | ICD-10-CM

## 2023-07-22 DIAGNOSIS — Z83438 Family history of other disorder of lipoprotein metabolism and other lipidemia: Secondary | ICD-10-CM

## 2023-07-22 DIAGNOSIS — M25511 Pain in right shoulder: Secondary | ICD-10-CM

## 2023-07-22 DIAGNOSIS — M797 Fibromyalgia: Secondary | ICD-10-CM | POA: Diagnosis present

## 2023-07-22 DIAGNOSIS — Z833 Family history of diabetes mellitus: Secondary | ICD-10-CM

## 2023-07-22 DIAGNOSIS — W228XXA Striking against or struck by other objects, initial encounter: Secondary | ICD-10-CM | POA: Diagnosis present

## 2023-07-22 DIAGNOSIS — T148XXA Other injury of unspecified body region, initial encounter: Secondary | ICD-10-CM

## 2023-07-22 DIAGNOSIS — S40011A Contusion of right shoulder, initial encounter: Secondary | ICD-10-CM | POA: Diagnosis not present

## 2023-07-22 DIAGNOSIS — E039 Hypothyroidism, unspecified: Secondary | ICD-10-CM | POA: Diagnosis not present

## 2023-07-22 DIAGNOSIS — Z7901 Long term (current) use of anticoagulants: Secondary | ICD-10-CM

## 2023-07-22 DIAGNOSIS — I10 Essential (primary) hypertension: Secondary | ICD-10-CM | POA: Diagnosis present

## 2023-07-22 DIAGNOSIS — J301 Allergic rhinitis due to pollen: Secondary | ICD-10-CM | POA: Diagnosis not present

## 2023-07-22 DIAGNOSIS — Z743 Need for continuous supervision: Secondary | ICD-10-CM | POA: Diagnosis not present

## 2023-07-22 DIAGNOSIS — Z79899 Other long term (current) drug therapy: Secondary | ICD-10-CM | POA: Diagnosis not present

## 2023-07-22 DIAGNOSIS — M7989 Other specified soft tissue disorders: Secondary | ICD-10-CM | POA: Diagnosis not present

## 2023-07-22 LAB — CBC WITH DIFFERENTIAL/PLATELET
Abs Immature Granulocytes: 0.03 10*3/uL (ref 0.00–0.07)
Basophils Absolute: 0 10*3/uL (ref 0.0–0.1)
Basophils Relative: 0 %
Eosinophils Absolute: 0.2 10*3/uL (ref 0.0–0.5)
Eosinophils Relative: 3 %
HCT: 33.7 % — ABNORMAL LOW (ref 36.0–46.0)
Hemoglobin: 10.7 g/dL — ABNORMAL LOW (ref 12.0–15.0)
Immature Granulocytes: 0 %
Lymphocytes Relative: 31 %
Lymphs Abs: 2.5 10*3/uL (ref 0.7–4.0)
MCH: 26.5 pg (ref 26.0–34.0)
MCHC: 31.8 g/dL (ref 30.0–36.0)
MCV: 83.4 fL (ref 80.0–100.0)
Monocytes Absolute: 0.6 10*3/uL (ref 0.1–1.0)
Monocytes Relative: 8 %
Neutro Abs: 4.6 10*3/uL (ref 1.7–7.7)
Neutrophils Relative %: 58 %
Platelets: 380 10*3/uL (ref 150–400)
RBC: 4.04 MIL/uL (ref 3.87–5.11)
RDW: 19.7 % — ABNORMAL HIGH (ref 11.5–15.5)
WBC: 8 10*3/uL (ref 4.0–10.5)
nRBC: 0 % (ref 0.0–0.2)

## 2023-07-22 LAB — COMPREHENSIVE METABOLIC PANEL WITH GFR
ALT: 19 U/L (ref 0–44)
AST: 19 U/L (ref 15–41)
Albumin: 3.6 g/dL (ref 3.5–5.0)
Alkaline Phosphatase: 76 U/L (ref 38–126)
Anion gap: 11 (ref 5–15)
BUN: 14 mg/dL (ref 8–23)
CO2: 26 mmol/L (ref 22–32)
Calcium: 7.7 mg/dL — ABNORMAL LOW (ref 8.9–10.3)
Chloride: 102 mmol/L (ref 98–111)
Creatinine, Ser: 0.84 mg/dL (ref 0.44–1.00)
GFR, Estimated: >60 mL/min (ref >60)
Glucose, Bld: 133 mg/dL — ABNORMAL HIGH (ref 70–99)
Potassium: 4 mmol/L (ref 3.5–5.1)
Sodium: 139 mmol/L (ref 135–145)
Total Bilirubin: 0.3 mg/dL (ref 0.0–1.2)
Total Protein: 8.2 g/dL — ABNORMAL HIGH (ref 6.5–8.1)

## 2023-07-22 LAB — CK: Total CK: 201 U/L (ref 38–234)

## 2023-07-22 LAB — TROPONIN I (HIGH SENSITIVITY): Troponin I (High Sensitivity): 10 ng/L (ref <18)

## 2023-07-22 LAB — BRAIN NATRIURETIC PEPTIDE: B Natriuretic Peptide: 39.4 pg/mL (ref 0.0–100.0)

## 2023-07-22 MED ORDER — SODIUM CHLORIDE 0.9 % IV BOLUS
500.0000 mL | Freq: Once | INTRAVENOUS | Status: AC
  Administered 2023-07-22: 500 mL via INTRAVENOUS

## 2023-07-22 MED ORDER — PHYTONADIONE 5 MG PO TABS
2.5000 mg | ORAL_TABLET | Freq: Once | ORAL | Status: AC
  Administered 2023-07-23: 2.5 mg via ORAL
  Filled 2023-07-22: qty 1

## 2023-07-22 MED ORDER — FENTANYL CITRATE PF 50 MCG/ML IJ SOSY
50.0000 ug | PREFILLED_SYRINGE | Freq: Once | INTRAMUSCULAR | Status: AC
  Administered 2023-07-22: 50 ug via INTRAVENOUS
  Filled 2023-07-22: qty 1

## 2023-07-22 NOTE — ED Provider Notes (Incomplete)
 [place] EMERGENCY DEPARTMENT AT [facility] Provider Note   CSN: [id number] Arrival date & time: 07/22/23  1922     History {Add pertinent medical, surgical, social history, OB history to HPI:1} Chief Complaint  Patient presents with  . Leg Pain    [name] is a 68 y.o. female.  The history is provided by the patient and medical records. No language interpreter was used.  Leg Pain Location:  Leg Time since incident:  1 week Injury: no   Leg location:  R lower leg Pain details:    Quality:  Aching   Radiates to:  Does not radiate   Severity:  Severe   Onset quality:  Gradual   Duration:  1 week   Timing:  Constant   Progression:  Unchanged Chronicity:  New Foreign body present:  No foreign bodies Tetanus status:  Unknown Prior injury to area:  No Relieved by:  Nothing Exacerbated by: movement. Ineffective treatments:  None tried Associated symptoms: no back pain, no fatigue, no fever and no neck pain        Home Medications Prior to Admission medications   Medication Sig Start Date End Date Taking? Authorizing Provider  ACCU-CHEK GUIDE TEST test strip USE TO TEST BLOOD SUGAR UP TO FOUR TIMES A DAY AS DIRECTED 03/19/23   Viola Greulich, MD  Accu-Chek Softclix Lancets lancets USE TO TEST BLOOD SUGAR UP TO FOUR TIMES A DAY AS DIRECTED 06/22/23   Viola Greulich, MD  albuterol  (PROVENTIL ) (2.5 MG/3ML) 0.083% nebulizer solution USE 1 VIAL IN NEBULIZER EVERY 6 HOURS - and as needed 06/12/23   Viola Greulich, MD  albuterol  (VENTOLIN  HFA) 108 (90 Base) MCG/ACT inhaler INHALE TWO (2) PUFFS BY MOUTH EVERY 4 HOURS AS NEEDED FOR SHORTNESS OF BREATH 02/13/23   Viola Greulich, MD  Blood Glucose Monitoring Suppl (ACCU-CHEK GUIDE) w/Device KIT USE TO TEST BLOOD SUGAR 3 TIMES DAILY 07/09/23   Viola Greulich, MD  Blood Glucose Monitoring Suppl DEVI 1 each by Does not apply route in the morning, at noon, and at bedtime. May substitute to any manufacturer  covered by patient's insurance. 06/11/23   Viola Greulich, MD  canagliflozin  (INVOKANA ) 100 MG TABS tablet Take 1 tablet (100 mg total) by mouth daily before breakfast. 11/10/22   Chandrasekhar, [name] A, MD  cloNIDine  (CATAPRES ) 0.1 MG tablet TAKE ONE (1) TABLET BY MOUTH TWICE DAILY BEFORE BREAKFAST AND AT BEDTIME 03/19/23   Viola Greulich, MD  diclofenac sodium (VOLTAREN) 1 % GEL Apply 2 g topically daily as needed (fibromyalgia pain- affected sites).    [provider]  diltiazem  (CARDIZEM  CD) 240 MG 24 hr capsule TAKE 1 CAPSULE BY MOUTH DAILY BEFORE BREAKFAST *REFILL REQUEST* 11/10/22   Viola Greulich, MD  DULoxetine  (CYMBALTA ) 30 MG capsule Take 90 mg by mouth at bedtime.    [provider]  FEROSUL 325 (65 Fe) MG tablet TAKE ONE TABLET BY MOUTH ONCE DAILY 07/19/21   Viola Greulich, MD  fexofenadine  (ALLEGRA ) 180 MG tablet Take 1 tablet (180 mg total) by mouth daily. 03/05/23   Viola Greulich, MD  fluticasone  (FLONASE ) 50 MCG/ACT nasal spray INSTILL 2 SPRAYS IN EACH NOSTRIL TWICE DAILY 06/25/23   Viola Greulich, MD  furosemide  (LASIX ) 80 MG tablet TAKE ONE (1) TABLET BY MOUTH TWICE DAILY 01/12/23   Chandrasekhar, [name] A, MD  gabapentin  (NEURONTIN ) 300 MG capsule TAKE ONE (1) CAPSULE BY MOUTH TWICE DAILY 06/13/23  Viola Greulich, MD  Glucose Blood (BLOOD GLUCOSE TEST STRIPS) STRP 1 each by In Vitro route in the morning, at noon, and at bedtime. May substitute to any manufacturer covered by patient's insurance. 06/11/23   Viola Greulich, MD  HYDROcodone -acetaminophen  (NORCO) 5-325 MG tablet Take 1 tablet by mouth every 6 (six) hours as needed for moderate pain (pain score 4-6). 04/04/23   Donley Furth, MD  Incontinence Supply Disposable (PROCARE BARIATRIC BRIEFS) MISC Use as needed. 03/05/23   Viola Greulich, MD  Lancets Misc. MISC May substitute to any manufacturer covered by patient's insurance. 06/11/23   Viola Greulich, MD  lidocaine  (LIDODERM ) 5 % APPLY 1 PATCH  TOPICALLY ON THE SKIN DAILY AS NEEDED TO PAINFUL SITE *12 HOURS ON AND 12 HOURS OFF OR AS DIRECTED BY MD* 07/12/23   Viola Greulich, MD  LINZESS  145 MCG CAPS capsule TAKE 1 CAPSULE BY MOUTH EVERY OTHER DAY 02/28/23   Viola Greulich, MD  magic mouthwash (multi-ingredient) oral suspension Swish and spit 5 mLs 4 (four) times daily as needed for mouth pain. 07/18/23   Viola Greulich, MD  metFORMIN  (GLUCOPHAGE ) 500 MG tablet TAKE 1 TABLET BY MOUTH EVERY DAY AT BEDTIME *REFILL REQUEST* 11/10/22   Viola Greulich, MD  methocarbamol  (ROBAXIN ) 500 MG tablet Take 1 tablet (500 mg total) by mouth 2 (two) times daily. Patient taking differently: Take 500 mg by mouth 2 (two) times daily as needed for muscle spasms. 03/22/22   Blue, Soijett A, PA-C  omeprazole  (PRILOSEC) 20 MG capsule Take 1 capsule (20 mg total) by mouth 2 (two) times daily before a meal. 02/13/23   Viola Greulich, MD  potassium chloride  (MICRO-K ) 10 MEQ CR capsule TAKE TWO (2) CAPSULES BY MOUTH EVERY DAY AT BEDTIME 03/19/23   Viola Greulich, MD  QUEtiapine  (SEROQUEL ) 50 MG tablet Take 50 mg by mouth in the morning and at bedtime.    [provider]  rosuvastatin  (CRESTOR ) 10 MG tablet Take 1 tablet (10 mg total) by mouth daily. 02/02/23 05/03/23  Gloriann Larger A, MD  Semaglutide , 2 MG/DOSE, (OZEMPIC , 2 MG/DOSE,) 8 MG/3ML SOPN Inject 2 mg into the skin once a week. 12/22/22   Viola Greulich, MD  spironolactone  (ALDACTONE ) 25 MG tablet TAKE 1/2 TABLET BY MOUTH ONCE DAILY 10/27/22   Jann Melody, MD  SYMBICORT  160-4.5 MCG/ACT inhaler INHALE TWO (2) PUFFS BY MOUTH TWICE DAILY 03/19/23   Viola Greulich, MD  traMADol  (ULTRAM ) 50 MG tablet Take 1 tablet (50 mg total) by mouth 3 (three) times daily as needed. 01/18/23   Arnie Lao, MD  Vitamin D , Ergocalciferol , (DRISDOL ) 1.25 MG (50000 UNIT) CAPS capsule Take 1 capsule (50,000 Units total) by mouth every 7 (seven) days. 07/13/23   Viola Greulich, MD   warfarin (COUMADIN ) 10 MG tablet Take daily as directed by MD 01/12/23   Sumner Ends, MD  warfarin (COUMADIN ) 7.5 MG tablet TAKE 1 TABLET BY MOUTH DAILY 02/12/23   Sumner Ends, MD  zonisamide  (ZONEGRAN ) 100 MG capsule TAKE 1 CAPSULE BY MOUTH EACH MORNING AND TAKE 2 CAPSULES IN THE EVENING 04/19/23   Viola Greulich, MD      Allergies    Lisinopril , Prednisone , Spironolactone , and Corticosteroids    Review of Systems   Review of Systems  Constitutional:  Negative for chills, fatigue and fever.  HENT:  Negative for congestion.   Respiratory:  Negative for cough, chest tightness, shortness of  breath and wheezing.   Cardiovascular:  Negative for chest pain and palpitations.  Gastrointestinal:  Negative for abdominal pain, constipation, diarrhea, nausea and vomiting.  Genitourinary:  Negative for dysuria and flank pain.  Musculoskeletal:  Negative for back pain, neck pain and neck stiffness.  Skin:  Positive for color change. Negative for rash and wound.  Neurological:  Positive for light-headedness. Negative for dizziness, weakness and headaches.  Psychiatric/Behavioral:  Negative for agitation and confusion.   All other systems reviewed and are negative.   Physical Exam Updated Vital Signs BP (!) 189/136 (BP Location: Right Arm)   Pulse 91   Temp 98.1 F (36.7 C) (Oral)   Resp 18   SpO2 98%  Physical Exam Vitals and nursing note reviewed.  Constitutional:      General: She is not in acute distress.    Appearance: She is well-developed. She is not ill-appearing, toxic-appearing or diaphoretic.  HENT:     Head: Normocephalic and atraumatic.     Nose: No congestion or rhinorrhea.     Mouth/Throat:     Mouth: Mucous membranes are moist.     Pharynx: No oropharyngeal exudate or posterior oropharyngeal erythema.  Eyes:     Extraocular Movements: Extraocular movements intact.     Conjunctiva/sclera: Conjunctivae normal.     Pupils: Pupils are equal, round, and reactive  to light.  Cardiovascular:     Rate and Rhythm: Normal rate and regular rhythm.     Heart sounds: No murmur heard. Pulmonary:     Effort: Pulmonary effort is normal. No respiratory distress.     Breath sounds: Normal breath sounds. No wheezing, rhonchi or rales.  Chest:     Chest wall: No tenderness.  Abdominal:     General: Abdomen is flat.     Palpations: Abdomen is soft.     Tenderness: There is no abdominal tenderness. There is no guarding or rebound.  Musculoskeletal:        General: Tenderness present. No swelling.     Cervical back: Neck supple.     Right lower leg: Tenderness present. No edema.     Left lower leg: No edema.       Legs:     Comments: Small bruise to right lateral shoulder.  Distally had intact sensation strength and pulses in legs.  No laceration.  Skin:    General: Skin is warm and dry.     Capillary Refill: Capillary refill takes less than 2 seconds.     Findings: Bruising present. No rash.  Neurological:     General: No focal deficit present.     Mental Status: She is alert.     Sensory: No sensory deficit.     Motor: No weakness.  Psychiatric:        Mood and Affect: Mood normal.     ED Results / Procedures / Treatments   Labs (all labs ordered are listed, but only abnormal results are displayed) Labs Reviewed  CBC WITH DIFFERENTIAL/PLATELET - Abnormal; Notable for the following components:      Result Value   Hemoglobin 10.7 (*)    HCT 33.7 (*)    RDW 19.7 (*)    All other components within normal limits  COMPREHENSIVE METABOLIC PANEL WITH GFR - Abnormal; Notable for the following components:   Glucose, Bld 133 (*)    Calcium  7.7 (*)    Total Protein 8.2 (*)    All other components within normal limits  CK  BRAIN NATRIURETIC PEPTIDE  PROTIME-INR  TYPE AND SCREEN  TROPONIN I (HIGH SENSITIVITY)    EKG EKG Interpretation Date/Time:  Sunday Jul 22 2023 21:28:27 EDT Ventricular Rate:  82 PR Interval:  163 QRS Duration:  94 QT  Interval:  412 QTC Calculation: 482 R Axis:   38  Text Interpretation: Sinus rhythm Nonspecific T abnormalities, lateral leads Baseline wander in lead(s) V1 when compared to prior, more wandering baseline No STEMI Confirmed by Wynell Heath ([id number]) on 07/22/2023 10:13:02 PM  Radiology DG Chest Portable 1 View Result Date: 07/22/2023 CLINICAL DATA:  Bruising right shoulder and shin. EXAM: PORTABLE CHEST 1 VIEW COMPARISON:  08/23/2022 FINDINGS: The heart size and mediastinal contours are within normal limits. Both lungs are clear. The visualized skeletal structures are unremarkable. IMPRESSION: No active disease. Electronically Signed   By: Janeece Mechanic M.D.   On: 07/22/2023 21:10   DG Tibia/Fibula Right Result Date: 07/22/2023 CLINICAL DATA:  Right shin bruising. EXAM: RIGHT TIBIA AND FIBULA - 2 VIEW COMPARISON:  None Available. FINDINGS: Prior right knee replacement. No visible hardware complicating feature. No acute bony abnormality. Specifically, no fracture, subluxation, or dislocation. Soft tissue swelling in the anterior shin. IMPRESSION: No acute bony abnormality. Electronically Signed   By: Janeece Mechanic M.D.   On: 07/22/2023 21:09   DG Shoulder Right Result Date: 07/22/2023 CLINICAL DATA:  Right shoulder bruising. EXAM: RIGHT SHOULDER - 2+ VIEW COMPARISON:  None Available. FINDINGS: Degenerative changes in the [facility] [address] joint with joint space narrowing and spurring. Glenohumeral joint is maintained. No acute bony abnormality. Specifically, no fracture, subluxation, or dislocation. Soft tissues are intact. IMPRESSION: Degenerative changes in the right AC joint. No acute bony abnormality. Electronically Signed   By: Janeece Mechanic M.D.   On: 07/22/2023 21:08    Procedures Procedures  {Document cardiac monitor, telemetry assessment procedure when appropriate:1}  Medications Ordered in ED Medications  sodium chloride  0.9 % bolus 500 mL (0 mLs Intravenous Stopped 07/22/23 2232)  fentaNYL  (SUBLIMAZE )  injection 50 mcg (50 mcg Intravenous Given 07/22/23 2125)  fentaNYL  (SUBLIMAZE ) injection 50 mcg (50 mcg Intravenous Given 07/22/23 2230)    ED Course/ Medical Decision Making/ A&P   {   Click here for ABCD2, HEART and other calculatorsREFRESH Note before signing :1}                              Medical Decision Making Amount and/or Complexity of Data Reviewed Labs: ordered. Radiology: ordered.  Risk Prescription drug management.    [name] is a 68 y.o. 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  According to patient, she was seen by her doctor several days ago for some bruising on her right shoulder and pain and was told it was likely just a bruise.  She says that over the last week she has had worsening pain in her right shin with bruising.  She denies any trauma to it.  She reports that the last time she had leg pains it was due to a GI bleed causing anemia and muscle pains.  She denies any constipation, diarrhea, or blood in her stools.  She denies any chest pain shortness of breath or palpitations does report a lightheadedness and on my initial evaluation heart rate  is around 130.  Patient denies any headache or neck pain and denies any fevers or chills.  She denies significant cough.  She denies any nausea, vomiting or bowel changes.  She reports the pain is greater than 10 out of 10 and she is quite uncomfortable.  She reports has been taking all her medicine as directed.  She is unsure if this feels like previous blood clot.  On exam, patient has significant tenderness to her right shin and there is overlying bruising which is where she is tender.  Posteriorly she is less tender and does not  have any tenderness to the knee itself or the thigh.  No hip tenderness.  Abdomen is completely nontender with normal bowel sounds.  Back and flanks nontender.  She has some tenderness to her right shoulder where she has a knot and some bruising.  Patient was tachycardic but was not tachypneic.  She is afebrile and is not hypoxic.  Blood pressure is elevated.  I was able to palpate DP and PT pulses in both of her feet and confirmed this with Dopplers as well.  Clinically I am somewhat concerned that she has spontaneous bruising that may be related to her supratherapeutic INR again.  Given the tachycardia and lightheadedness, patient could have recurrent symptomatic anemia.  Will get x-rays where she is hurting, get screening labs, do a type and screen, give her very small amount of fluids with the tachycardia, and will get x-ray.  We do not have the ultrasound capabilities to do a DVT study at this time however her pain is more on the shin and not in the calf so have less suspicion for this.  As she had intact pulses, have low suspicion for an arterial problem so we will hold on CT with runoff at this time.  She had no focal neurologic deficits initially.  Anticipate reassessment after workup to determine disposition.  Workup has been returning reassuring.  CK not elevated.  CBC similar to prior and metabolic panel slowed decrease in calcium  slightly.  Patient says she does not want calcium  supplementation.  BNP not elevated.  Initial troponin normal.  Given lack of chest pain we will hold on repeat troponin.  Suspect bruising led to pain and symptoms.  Just waiting on INR to return.  If it is not significant elevated, anticipate plan for discharge home and follow-up with primary team.  11:55 PM INR just returned at 6.4.  This is likely because of her spontaneous bleeding and significant pain.  Given her history of GI bleed in the setting of elevated INR, and her inability to ambulate safely due to the  severe pain from the bruising, will call for admission.  {Document critical care time when appropriate:1} {Document review of labs and clinical decision tools ie heart score, Chads2Vasc2 etc:1}  {Document your independent review of radiology images, and any outside records:1} {Document your discussion with family members, caretakers, and with consultants:1} {Document social determinants of health affecting pt's care:1} {Document your decision making why or why not admission, treatments were needed:1} Final Clinical Impression(s) / ED Diagnoses Final diagnoses:  None    Rx / DC Orders ED Discharge Orders     None

## 2023-07-22 NOTE — ED Provider Notes (Signed)
 [place] EMERGENCY DEPARTMENT AT [facility] Provider Note   CSN: [id number] Arrival date & time: 07/22/23  1922     History {Add pertinent medical, surgical, social history, OB history to HPI:1} Chief Complaint  Patient presents with   Leg Pain    [name] is a 68 y.o. female.  The history is provided by the patient and medical records. No language interpreter was used.  Leg Pain Location:  Leg Time since incident:  1 week Injury: no   Leg location:  R lower leg Pain details:    Quality:  Aching   Radiates to:  Does not radiate   Severity:  Severe   Onset quality:  Gradual   Duration:  1 week   Timing:  Constant   Progression:  Unchanged Chronicity:  New Foreign body present:  No foreign bodies Tetanus status:  Unknown Prior injury to area:  No Relieved by:  Nothing Exacerbated by: movement. Ineffective treatments:  None tried Associated symptoms: no back pain, no fatigue, no fever and no neck pain        Home Medications Prior to Admission medications   Medication Sig Start Date End Date Taking? Authorizing Provider  ACCU-CHEK GUIDE TEST test strip USE TO TEST BLOOD SUGAR UP TO FOUR TIMES A DAY AS DIRECTED 03/19/23   Viola Greulich, MD  Accu-Chek Softclix Lancets lancets USE TO TEST BLOOD SUGAR UP TO FOUR TIMES A DAY AS DIRECTED 06/22/23   Viola Greulich, MD  albuterol  (PROVENTIL ) (2.5 MG/3ML) 0.083% nebulizer solution USE 1 VIAL IN NEBULIZER EVERY 6 HOURS - and as needed 06/12/23   Viola Greulich, MD  albuterol  (VENTOLIN  HFA) 108 (90 Base) MCG/ACT inhaler INHALE TWO (2) PUFFS BY MOUTH EVERY 4 HOURS AS NEEDED FOR SHORTNESS OF BREATH 02/13/23   Viola Greulich, MD  Blood Glucose Monitoring Suppl (ACCU-CHEK GUIDE) w/Device KIT USE TO TEST BLOOD SUGAR 3 TIMES DAILY 07/09/23   Viola Greulich, MD  Blood Glucose Monitoring Suppl DEVI 1 each by Does not apply route in the morning, at noon, and at bedtime. May substitute to any manufacturer  covered by patient's insurance. 06/11/23   Viola Greulich, MD  canagliflozin  (INVOKANA ) 100 MG TABS tablet Take 1 tablet (100 mg total) by mouth daily before breakfast. 11/10/22   Chandrasekhar, [name] A, MD  cloNIDine  (CATAPRES ) 0.1 MG tablet TAKE ONE (1) TABLET BY MOUTH TWICE DAILY BEFORE BREAKFAST AND AT BEDTIME 03/19/23   Viola Greulich, MD  diclofenac sodium (VOLTAREN) 1 % GEL Apply 2 g topically daily as needed (fibromyalgia pain- affected sites).    [provider]  diltiazem  (CARDIZEM  CD) 240 MG 24 hr capsule TAKE 1 CAPSULE BY MOUTH DAILY BEFORE BREAKFAST *REFILL REQUEST* 11/10/22   Viola Greulich, MD  DULoxetine  (CYMBALTA ) 30 MG capsule Take 90 mg by mouth at bedtime.    [provider]  FEROSUL 325 (65 Fe) MG tablet TAKE ONE TABLET BY MOUTH ONCE DAILY 07/19/21   Viola Greulich, MD  fexofenadine  (ALLEGRA ) 180 MG tablet Take 1 tablet (180 mg total) by mouth daily. 03/05/23   Viola Greulich, MD  fluticasone  (FLONASE ) 50 MCG/ACT nasal spray INSTILL 2 SPRAYS IN EACH NOSTRIL TWICE DAILY 06/25/23   Viola Greulich, MD  furosemide  (LASIX ) 80 MG tablet TAKE ONE (1) TABLET BY MOUTH TWICE DAILY 01/12/23   Chandrasekhar, [name] A, MD  gabapentin  (NEURONTIN ) 300 MG capsule TAKE ONE (1) CAPSULE BY MOUTH TWICE DAILY 06/13/23  Viola Greulich, MD  Glucose Blood (BLOOD GLUCOSE TEST STRIPS) STRP 1 each by In Vitro route in the morning, at noon, and at bedtime. May substitute to any manufacturer covered by patient's insurance. 06/11/23   Viola Greulich, MD  HYDROcodone -acetaminophen  (NORCO) 5-325 MG tablet Take 1 tablet by mouth every 6 (six) hours as needed for moderate pain (pain score 4-6). 04/04/23   Donley Furth, MD  Incontinence Supply Disposable (PROCARE BARIATRIC BRIEFS) MISC Use as needed. 03/05/23   Viola Greulich, MD  Lancets Misc. MISC May substitute to any manufacturer covered by patient's insurance. 06/11/23   Viola Greulich, MD  lidocaine  (LIDODERM ) 5 % APPLY 1 PATCH  TOPICALLY ON THE SKIN DAILY AS NEEDED TO PAINFUL SITE *12 HOURS ON AND 12 HOURS OFF OR AS DIRECTED BY MD* 07/12/23   Viola Greulich, MD  LINZESS  145 MCG CAPS capsule TAKE 1 CAPSULE BY MOUTH EVERY OTHER DAY 02/28/23   Viola Greulich, MD  magic mouthwash (multi-ingredient) oral suspension Swish and spit 5 mLs 4 (four) times daily as needed for mouth pain. 07/18/23   Viola Greulich, MD  metFORMIN  (GLUCOPHAGE ) 500 MG tablet TAKE 1 TABLET BY MOUTH EVERY DAY AT BEDTIME *REFILL REQUEST* 11/10/22   Viola Greulich, MD  methocarbamol  (ROBAXIN ) 500 MG tablet Take 1 tablet (500 mg total) by mouth 2 (two) times daily. Patient taking differently: Take 500 mg by mouth 2 (two) times daily as needed for muscle spasms. 03/22/22   Blue, Soijett A, PA-C  omeprazole  (PRILOSEC) 20 MG capsule Take 1 capsule (20 mg total) by mouth 2 (two) times daily before a meal. 02/13/23   Viola Greulich, MD  potassium chloride  (MICRO-K ) 10 MEQ CR capsule TAKE TWO (2) CAPSULES BY MOUTH EVERY DAY AT BEDTIME 03/19/23   Viola Greulich, MD  QUEtiapine  (SEROQUEL ) 50 MG tablet Take 50 mg by mouth in the morning and at bedtime.    [provider]  rosuvastatin  (CRESTOR ) 10 MG tablet Take 1 tablet (10 mg total) by mouth daily. 02/02/23 05/03/23  Gloriann Larger A, MD  Semaglutide , 2 MG/DOSE, (OZEMPIC , 2 MG/DOSE,) 8 MG/3ML SOPN Inject 2 mg into the skin once a week. 12/22/22   Viola Greulich, MD  spironolactone  (ALDACTONE ) 25 MG tablet TAKE 1/2 TABLET BY MOUTH ONCE DAILY 10/27/22   Jann Melody, MD  SYMBICORT  160-4.5 MCG/ACT inhaler INHALE TWO (2) PUFFS BY MOUTH TWICE DAILY 03/19/23   Viola Greulich, MD  traMADol  (ULTRAM ) 50 MG tablet Take 1 tablet (50 mg total) by mouth 3 (three) times daily as needed. 01/18/23   Arnie Lao, MD  Vitamin D , Ergocalciferol , (DRISDOL ) 1.25 MG (50000 UNIT) CAPS capsule Take 1 capsule (50,000 Units total) by mouth every 7 (seven) days. 07/13/23   Viola Greulich, MD   warfarin (COUMADIN ) 10 MG tablet Take daily as directed by MD 01/12/23   Sumner Ends, MD  warfarin (COUMADIN ) 7.5 MG tablet TAKE 1 TABLET BY MOUTH DAILY 02/12/23   Sumner Ends, MD  zonisamide  (ZONEGRAN ) 100 MG capsule TAKE 1 CAPSULE BY MOUTH EACH MORNING AND TAKE 2 CAPSULES IN THE EVENING 04/19/23   Viola Greulich, MD      Allergies    Lisinopril , Prednisone , Spironolactone , and Corticosteroids    Review of Systems   Review of Systems  Constitutional:  Negative for chills, fatigue and fever.  HENT:  Negative for congestion.   Respiratory:  Negative for cough, chest tightness, shortness of  breath and wheezing.   Cardiovascular:  Negative for chest pain and palpitations.  Gastrointestinal:  Negative for abdominal pain, constipation, diarrhea, nausea and vomiting.  Genitourinary:  Negative for dysuria and flank pain.  Musculoskeletal:  Negative for back pain, neck pain and neck stiffness.  Skin:  Positive for color change. Negative for rash and wound.  Neurological:  Positive for light-headedness. Negative for dizziness, weakness and headaches.  Psychiatric/Behavioral:  Negative for agitation and confusion.   All other systems reviewed and are negative.   Physical Exam Updated Vital Signs BP (!) 189/136 (BP Location: Right Arm)   Pulse 91   Temp 98.1 F (36.7 C) (Oral)   Resp 18   SpO2 98%  Physical Exam Vitals and nursing note reviewed.  Constitutional:      General: She is not in acute distress.    Appearance: She is well-developed. She is not ill-appearing, toxic-appearing or diaphoretic.  HENT:     Head: Normocephalic and atraumatic.     Nose: No congestion or rhinorrhea.     Mouth/Throat:     Mouth: Mucous membranes are moist.     Pharynx: No oropharyngeal exudate or posterior oropharyngeal erythema.  Eyes:     Extraocular Movements: Extraocular movements intact.     Conjunctiva/sclera: Conjunctivae normal.     Pupils: Pupils are equal, round, and reactive  to light.  Cardiovascular:     Rate and Rhythm: Normal rate and regular rhythm.     Heart sounds: No murmur heard. Pulmonary:     Effort: Pulmonary effort is normal. No respiratory distress.     Breath sounds: Normal breath sounds. No wheezing, rhonchi or rales.  Chest:     Chest wall: No tenderness.  Abdominal:     General: Abdomen is flat.     Palpations: Abdomen is soft.     Tenderness: There is no abdominal tenderness. There is no guarding or rebound.  Musculoskeletal:        General: Tenderness present. No swelling.     Cervical back: Neck supple.     Right lower leg: Tenderness present. No edema.     Left lower leg: No edema.       Legs:     Comments: Small bruise to right lateral shoulder.  Distally had intact sensation strength and pulses in legs.  No laceration.  Skin:    General: Skin is warm and dry.     Capillary Refill: Capillary refill takes less than 2 seconds.     Findings: Bruising present. No rash.  Neurological:     General: No focal deficit present.     Mental Status: She is alert.     Sensory: No sensory deficit.     Motor: No weakness.  Psychiatric:        Mood and Affect: Mood normal.     ED Results / Procedures / Treatments   Labs (all labs ordered are listed, but only abnormal results are displayed) Labs Reviewed  CBC WITH DIFFERENTIAL/PLATELET - Abnormal; Notable for the following components:      Result Value   Hemoglobin 10.7 (*)    HCT 33.7 (*)    RDW 19.7 (*)    All other components within normal limits  COMPREHENSIVE METABOLIC PANEL WITH GFR - Abnormal; Notable for the following components:   Glucose, Bld 133 (*)    Calcium  7.7 (*)    Total Protein 8.2 (*)    All other components within normal limits  CK  BRAIN NATRIURETIC PEPTIDE  PROTIME-INR  TYPE AND SCREEN  TROPONIN I (HIGH SENSITIVITY)    EKG EKG Interpretation Date/Time:  Sunday Jul 22 2023 21:28:27 EDT Ventricular Rate:  82 PR Interval:  163 QRS Duration:  94 QT  Interval:  412 QTC Calculation: 482 R Axis:   38  Text Interpretation: Sinus rhythm Nonspecific T abnormalities, lateral leads Baseline wander in lead(s) V1 when compared to prior, more wandering baseline No STEMI Confirmed by Wynell Heath ([id number]) on 07/22/2023 10:13:02 PM  Radiology DG Chest Portable 1 View Result Date: 07/22/2023 CLINICAL DATA:  Bruising right shoulder and shin. EXAM: PORTABLE CHEST 1 VIEW COMPARISON:  08/23/2022 FINDINGS: The heart size and mediastinal contours are within normal limits. Both lungs are clear. The visualized skeletal structures are unremarkable. IMPRESSION: No active disease. Electronically Signed   By: Janeece Mechanic M.D.   On: 07/22/2023 21:10   DG Tibia/Fibula Right Result Date: 07/22/2023 CLINICAL DATA:  Right shin bruising. EXAM: RIGHT TIBIA AND FIBULA - 2 VIEW COMPARISON:  None Available. FINDINGS: Prior right knee replacement. No visible hardware complicating feature. No acute bony abnormality. Specifically, no fracture, subluxation, or dislocation. Soft tissue swelling in the anterior shin. IMPRESSION: No acute bony abnormality. Electronically Signed   By: Janeece Mechanic M.D.   On: 07/22/2023 21:09   DG Shoulder Right Result Date: 07/22/2023 CLINICAL DATA:  Right shoulder bruising. EXAM: RIGHT SHOULDER - 2+ VIEW COMPARISON:  None Available. FINDINGS: Degenerative changes in the [facility] joint with joint space narrowing and spurring. Glenohumeral joint is maintained. No acute bony abnormality. Specifically, no fracture, subluxation, or dislocation. Soft tissues are intact. IMPRESSION: Degenerative changes in the right AC joint. No acute bony abnormality. Electronically Signed   By: Janeece Mechanic M.D.   On: 07/22/2023 21:08    Procedures Procedures  {Document cardiac monitor, telemetry assessment procedure when appropriate:1}  Medications Ordered in ED Medications  sodium chloride  0.9 % bolus 500 mL (0 mLs Intravenous Stopped 07/22/23 2232)  fentaNYL  (SUBLIMAZE )  injection 50 mcg (50 mcg Intravenous Given 07/22/23 2125)  fentaNYL  (SUBLIMAZE ) injection 50 mcg (50 mcg Intravenous Given 07/22/23 2230)    ED Course/ Medical Decision Making/ A&P   {   Click here for ABCD2, HEART and other calculatorsREFRESH Note before signing :1}                              Medical Decision Making Amount and/or Complexity of Data Reviewed Labs: ordered. Radiology: ordered.  Risk Prescription drug management.    Litzzy Wain Harshfield is a 68 y.o. 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  According to patient, she was seen by her doctor several days ago for some bruising on her right shoulder and pain and was told it was likely just a bruise.  She says that over the last week she has had worsening pain in her right shin with bruising.  She denies any trauma to it.  She reports that the last time she had leg pains it was due to a GI bleed causing anemia and muscle pains.  She denies any constipation, diarrhea, or blood in her stools.  She denies any chest pain shortness of breath or palpitations does report a lightheadedness and on my initial evaluation heart rate  is around 130.  Patient denies any headache or neck pain and denies any fevers or chills.  She denies significant cough.  She denies any nausea, vomiting or bowel changes.  She reports the pain is greater than 10 out of 10 and she is quite uncomfortable.  She reports has been taking all her medicine as directed.  She is unsure if this feels like previous blood clot.  On exam, patient has significant tenderness to her right shin and there is overlying bruising which is where she is tender.  Posteriorly she is less tender and does not  have any tenderness to the knee itself or the thigh.  No hip tenderness.  Abdomen is completely nontender with normal bowel sounds.  Back and flanks nontender.  She has some tenderness to her right shoulder where she has a knot and some bruising.  Patient was tachycardic but was not tachypneic.  She is afebrile and is not hypoxic.  Blood pressure is elevated.  I was able to palpate DP and PT pulses in both of her feet and confirmed this with Dopplers as well.  Clinically I am somewhat concerned that she has spontaneous bruising that may be related to her supratherapeutic INR again.  Given the tachycardia and lightheadedness, patient could have recurrent symptomatic anemia.  Will get x-rays where she is hurting, get screening labs, do a type and screen, give her very small amount of fluids with the tachycardia, and will get x-ray.  We do not have the ultrasound capabilities to do a DVT study at this time however her pain is more on the shin and not in the calf so have less suspicion for this.  As she had intact pulses, have low suspicion for an arterial problem so we will hold on CT with runoff at this time.  She had no focal neurologic deficits initially.  Anticipate reassessment after workup to determine disposition.  Workup has been returning reassuring.  CK not elevated.  CBC similar to prior and metabolic panel slowed decrease in calcium  slightly.  Patient says she does not want calcium  supplementation.  BNP not elevated.  Initial troponin normal.  Given lack of chest pain we will hold on repeat troponin.  Suspect bruising led to pain and symptoms.  Just waiting on INR to return.  If it is not significant elevated, anticipate plan for discharge home and follow-up with primary team.   {Document critical care time when appropriate:1} {Document review of labs and clinical decision tools ie heart score, Chads2Vasc2 etc:1}  {Document your independent review of radiology images, and any outside  records:1} {Document your discussion with family members, caretakers, and with consultants:1} {Document social determinants of health affecting pt's care:1} {Document your decision making why or why not admission, treatments were needed:1} Final Clinical Impression(s) / ED Diagnoses Final diagnoses:  None    Rx / DC Orders ED Discharge Orders     None

## 2023-07-22 NOTE — ED Triage Notes (Signed)
 Patient arrived with complaints of right leg pain over the last week. States its causing her too much pain to stand. Declines any known injury, reports DVT in the past stating it feels the same.

## 2023-07-23 ENCOUNTER — Encounter (HOSPITAL_COMMUNITY): Payer: Self-pay | Admitting: Internal Medicine

## 2023-07-23 DIAGNOSIS — D62 Acute posthemorrhagic anemia: Secondary | ICD-10-CM | POA: Diagnosis present

## 2023-07-23 DIAGNOSIS — M79604 Pain in right leg: Secondary | ICD-10-CM | POA: Diagnosis not present

## 2023-07-23 DIAGNOSIS — R791 Abnormal coagulation profile: Secondary | ICD-10-CM

## 2023-07-23 DIAGNOSIS — J301 Allergic rhinitis due to pollen: Secondary | ICD-10-CM | POA: Diagnosis not present

## 2023-07-23 DIAGNOSIS — Z8709 Personal history of other diseases of the respiratory system: Secondary | ICD-10-CM

## 2023-07-23 DIAGNOSIS — I5032 Chronic diastolic (congestive) heart failure: Secondary | ICD-10-CM | POA: Diagnosis not present

## 2023-07-23 DIAGNOSIS — E782 Mixed hyperlipidemia: Secondary | ICD-10-CM | POA: Diagnosis not present

## 2023-07-23 DIAGNOSIS — E1142 Type 2 diabetes mellitus with diabetic polyneuropathy: Secondary | ICD-10-CM | POA: Diagnosis not present

## 2023-07-23 DIAGNOSIS — I10 Essential (primary) hypertension: Secondary | ICD-10-CM

## 2023-07-23 LAB — CBC WITH DIFFERENTIAL/PLATELET
Abs Immature Granulocytes: 0.02 10*3/uL (ref 0.00–0.07)
Basophils Absolute: 0 10*3/uL (ref 0.0–0.1)
Basophils Relative: 0 %
Eosinophils Absolute: 0.3 10*3/uL (ref 0.0–0.5)
Eosinophils Relative: 4 %
HCT: 33.8 % — ABNORMAL LOW (ref 36.0–46.0)
Hemoglobin: 10.5 g/dL — ABNORMAL LOW (ref 12.0–15.0)
Immature Granulocytes: 0 %
Lymphocytes Relative: 31 %
Lymphs Abs: 2.3 10*3/uL (ref 0.7–4.0)
MCH: 26.3 pg (ref 26.0–34.0)
MCHC: 31.1 g/dL (ref 30.0–36.0)
MCV: 84.7 fL (ref 80.0–100.0)
Monocytes Absolute: 0.7 10*3/uL (ref 0.1–1.0)
Monocytes Relative: 10 %
Neutro Abs: 4 10*3/uL (ref 1.7–7.7)
Neutrophils Relative %: 55 %
Platelets: 356 10*3/uL (ref 150–400)
RBC: 3.99 MIL/uL (ref 3.87–5.11)
RDW: 19.7 % — ABNORMAL HIGH (ref 11.5–15.5)
WBC: 7.3 10*3/uL (ref 4.0–10.5)
nRBC: 0 % (ref 0.0–0.2)

## 2023-07-23 LAB — TYPE AND SCREEN
ABO/RH(D): A POS
Antibody Screen: POSITIVE

## 2023-07-23 LAB — COMPREHENSIVE METABOLIC PANEL WITH GFR
ALT: 18 U/L (ref 0–44)
AST: 17 U/L (ref 15–41)
Albumin: 3.7 g/dL (ref 3.5–5.0)
Alkaline Phosphatase: 77 U/L (ref 38–126)
Anion gap: 11 (ref 5–15)
BUN: 15 mg/dL (ref 8–23)
CO2: 24 mmol/L (ref 22–32)
Calcium: 7.9 mg/dL — ABNORMAL LOW (ref 8.9–10.3)
Chloride: 106 mmol/L (ref 98–111)
Creatinine, Ser: 0.66 mg/dL (ref 0.44–1.00)
GFR, Estimated: >60 mL/min (ref >60)
Glucose, Bld: 120 mg/dL — ABNORMAL HIGH (ref 70–99)
Potassium: 4 mmol/L (ref 3.5–5.1)
Sodium: 141 mmol/L (ref 135–145)
Total Bilirubin: 0.4 mg/dL (ref 0.0–1.2)
Total Protein: 8 g/dL (ref 6.5–8.1)

## 2023-07-23 LAB — IRON AND TIBC
Iron: 53 ug/dL (ref 28–170)
Saturation Ratios: 17 % (ref 10.4–31.8)
TIBC: 319 ug/dL (ref 250–450)
UIBC: 266 ug/dL

## 2023-07-23 LAB — HEMOGLOBIN AND HEMATOCRIT, BLOOD
HCT: 34.3 % — ABNORMAL LOW (ref 36.0–46.0)
HCT: 33.9 % — ABNORMAL LOW (ref 36.0–46.0)
Hemoglobin: 10.4 g/dL — ABNORMAL LOW (ref 12.0–15.0)
Hemoglobin: 10.4 g/dL — ABNORMAL LOW (ref 12.0–15.0)

## 2023-07-23 LAB — GLUCOSE, CAPILLARY
Glucose-Capillary: 163 mg/dL — ABNORMAL HIGH (ref 70–99)
Glucose-Capillary: 113 mg/dL — ABNORMAL HIGH (ref 70–99)
Glucose-Capillary: 132 mg/dL — ABNORMAL HIGH (ref 70–99)
Glucose-Capillary: 124 mg/dL — ABNORMAL HIGH (ref 70–99)

## 2023-07-23 LAB — PROTIME-INR
INR: 6.4 (ref 0.8–1.2)
INR: 5.6 (ref 0.8–1.2)
Prothrombin Time: 56.5 s — ABNORMAL HIGH (ref 11.4–15.2)
Prothrombin Time: 50.7 s — ABNORMAL HIGH (ref 11.4–15.2)

## 2023-07-23 LAB — FERRITIN: Ferritin: 32 ng/mL (ref 11–307)

## 2023-07-23 LAB — MAGNESIUM: Magnesium: 1.7 mg/dL (ref 1.7–2.4)

## 2023-07-23 MED ORDER — DILTIAZEM HCL ER COATED BEADS 240 MG PO CP24
240.0000 mg | ORAL_CAPSULE | Freq: Every day | ORAL | Status: DC
  Administered 2023-07-23 – 2023-07-26 (×4): 240 mg via ORAL
  Filled 2023-07-23 (×4): qty 1

## 2023-07-23 MED ORDER — PHYTONADIONE 5 MG PO TABS
5.0000 mg | ORAL_TABLET | Freq: Once | ORAL | Status: AC
  Administered 2023-07-23: 5 mg via ORAL
  Filled 2023-07-23: qty 1

## 2023-07-23 MED ORDER — FLUTICASONE PROPIONATE 50 MCG/ACT NA SUSP
2.0000 | Freq: Two times a day (BID) | NASAL | Status: DC
  Administered 2023-07-23 – 2023-07-26 (×6): 2 via NASAL
  Filled 2023-07-23: qty 16

## 2023-07-23 MED ORDER — MELATONIN 3 MG PO TABS
3.0000 mg | ORAL_TABLET | Freq: Every evening | ORAL | Status: DC | PRN
  Administered 2023-07-23 – 2023-07-25 (×4): 3 mg via ORAL
  Filled 2023-07-23 (×3): qty 1

## 2023-07-23 MED ORDER — LORATADINE 10 MG PO TABS
10.0000 mg | ORAL_TABLET | Freq: Every day | ORAL | Status: DC
  Administered 2023-07-23 – 2023-07-26 (×4): 10 mg via ORAL
  Filled 2023-07-23 (×4): qty 1

## 2023-07-23 MED ORDER — MAGIC MOUTHWASH W/LIDOCAINE
5.0000 mL | Freq: Four times a day (QID) | ORAL | Status: DC
  Administered 2023-07-23 – 2023-07-26 (×9): 5 mL via ORAL
  Filled 2023-07-23 (×14): qty 5

## 2023-07-23 MED ORDER — QUETIAPINE FUMARATE 25 MG PO TABS: 150.0000 mg | ORAL_TABLET | Freq: Every day | ORAL | Status: DC

## 2023-07-23 MED ORDER — ROSUVASTATIN CALCIUM 10 MG PO TABS
10.0000 mg | ORAL_TABLET | Freq: Every day | ORAL | Status: DC
  Administered 2023-07-23 – 2023-07-26 (×4): 10 mg via ORAL
  Filled 2023-07-23 (×4): qty 1

## 2023-07-23 MED ORDER — ZONISAMIDE 100 MG PO CAPS
100.0000 mg | ORAL_CAPSULE | Freq: Two times a day (BID) | ORAL | Status: DC
  Administered 2023-07-23 – 2023-07-26 (×7): 100 mg via ORAL
  Filled 2023-07-23 (×7): qty 1

## 2023-07-23 MED ORDER — FENTANYL CITRATE PF 50 MCG/ML IJ SOSY
50.0000 ug | PREFILLED_SYRINGE | Freq: Once | INTRAMUSCULAR | Status: AC
  Administered 2023-07-23: 50 ug via INTRAVENOUS
  Filled 2023-07-23: qty 1

## 2023-07-23 MED ORDER — ONDANSETRON HCL 4 MG/2ML IJ SOLN: 4.0000 mg | Freq: Four times a day (QID) | INTRAMUSCULAR | Status: DC | PRN

## 2023-07-23 MED ORDER — HYDRALAZINE HCL 20 MG/ML IJ SOLN
10.0000 mg | Freq: Four times a day (QID) | INTRAMUSCULAR | Status: DC | PRN
  Administered 2023-07-23: 10 mg via INTRAVENOUS
  Filled 2023-07-23: qty 1

## 2023-07-23 MED ORDER — DULOXETINE HCL 30 MG PO CPEP
90.0000 mg | ORAL_CAPSULE | Freq: Every day | ORAL | Status: DC
  Administered 2023-07-23 – 2023-07-25 (×3): 90 mg via ORAL
  Filled 2023-07-23 (×3): qty 3

## 2023-07-23 MED ORDER — ACETAMINOPHEN 325 MG PO TABS
650.0000 mg | ORAL_TABLET | Freq: Four times a day (QID) | ORAL | Status: DC | PRN
  Administered 2023-07-23: 650 mg via ORAL
  Filled 2023-07-23: qty 2

## 2023-07-23 MED ORDER — INSULIN ASPART 100 UNIT/ML IJ SOLN
0.0000 [IU] | Freq: Three times a day (TID) | INTRAMUSCULAR | Status: DC
  Administered 2023-07-23: 1 [IU] via SUBCUTANEOUS
  Administered 2023-07-23: 2 [IU] via SUBCUTANEOUS
  Administered 2023-07-24 (×3): 1 [IU] via SUBCUTANEOUS
  Administered 2023-07-25 (×2): 2 [IU] via SUBCUTANEOUS
  Administered 2023-07-26: 1 [IU] via SUBCUTANEOUS

## 2023-07-23 MED ORDER — FENTANYL CITRATE PF 50 MCG/ML IJ SOSY
50.0000 ug | PREFILLED_SYRINGE | INTRAMUSCULAR | Status: DC | PRN
  Administered 2023-07-23 – 2023-07-26 (×9): 50 ug via INTRAVENOUS
  Filled 2023-07-23 (×9): qty 1

## 2023-07-23 MED ORDER — CLONIDINE HCL 0.1 MG PO TABS
0.2000 mg | ORAL_TABLET | Freq: Two times a day (BID) | ORAL | Status: DC
  Administered 2023-07-23 – 2023-07-26 (×6): 0.2 mg via ORAL
  Filled 2023-07-23 (×7): qty 2

## 2023-07-23 MED ORDER — METHOCARBAMOL 500 MG PO TABS
500.0000 mg | ORAL_TABLET | Freq: Two times a day (BID) | ORAL | Status: DC | PRN
  Administered 2023-07-23 – 2023-07-26 (×6): 500 mg via ORAL
  Filled 2023-07-23 (×5): qty 1

## 2023-07-23 MED ORDER — ACETAMINOPHEN 500 MG PO TABS
1000.0000 mg | ORAL_TABLET | Freq: Four times a day (QID) | ORAL | Status: DC | PRN
  Administered 2023-07-23 – 2023-07-26 (×7): 1000 mg via ORAL
  Filled 2023-07-23 (×7): qty 2

## 2023-07-23 MED ORDER — FERROUS SULFATE 325 (65 FE) MG PO TABS
325.0000 mg | ORAL_TABLET | Freq: Every day | ORAL | Status: DC
  Administered 2023-07-23 – 2023-07-26 (×4): 325 mg via ORAL
  Filled 2023-07-23 (×4): qty 1

## 2023-07-23 MED ORDER — PANTOPRAZOLE SODIUM 40 MG PO TBEC
40.0000 mg | DELAYED_RELEASE_TABLET | Freq: Every day | ORAL | Status: DC
  Administered 2023-07-23 – 2023-07-26 (×4): 40 mg via ORAL
  Filled 2023-07-23 (×4): qty 1

## 2023-07-23 MED ORDER — NALOXONE HCL 0.4 MG/ML IJ SOLN: 0.4000 mg | INTRAMUSCULAR | Status: DC | PRN

## 2023-07-23 MED ORDER — FLUTICASONE FUROATE-VILANTEROL 200-25 MCG/ACT IN AEPB
1.0000 | INHALATION_SPRAY | Freq: Every day | RESPIRATORY_TRACT | Status: DC
  Administered 2023-07-23 – 2023-07-26 (×4): 1 via RESPIRATORY_TRACT
  Filled 2023-07-23: qty 28

## 2023-07-23 MED ORDER — ALBUTEROL SULFATE (2.5 MG/3ML) 0.083% IN NEBU: 2.5000 mg | INHALATION_SOLUTION | RESPIRATORY_TRACT | Status: DC | PRN

## 2023-07-23 MED ORDER — ACETAMINOPHEN 650 MG RE SUPP: 650.0000 mg | Freq: Four times a day (QID) | RECTAL | Status: DC | PRN

## 2023-07-23 MED ORDER — GABAPENTIN 300 MG PO CAPS
300.0000 mg | ORAL_CAPSULE | Freq: Two times a day (BID) | ORAL | Status: DC
  Administered 2023-07-23 – 2023-07-26 (×7): 300 mg via ORAL
  Filled 2023-07-23 (×7): qty 1

## 2023-07-23 NOTE — ED Notes (Signed)
 ED TO INPATIENT HANDOFF REPORT  ED Nurse Name and Phone #: Sherley Distad EMTP [phone]  S Name/Age/Gender [name] 68 y.o. female Room/Bed: WA21/WA21  Code Status   Code Status: Prior  Home/SNF/Other Home Patient oriented to: self, place, time, and situation Is this baseline? Yes   Triage Complete: Triage complete  Chief Complaint Supratherapeutic INR [R79.1]  Triage Note Patient arrived with complaints of right leg pain over the last week. States its causing her too much pain to stand. Declines any known injury, reports DVT in the past stating it feels the same.    Allergies Allergies  Allergen Reactions   Lisinopril  Anaphylaxis, Swelling and Other (See Comments)    THROAT SWELLING  Pt states her throat started to "close up" after being on it for "so long."  THROAT SWELLING, Pt states her throat started to "close up" after being on it for "so long."   Prednisone  Anaphylaxis, Swelling and Other (See Comments)    Throat swelling and tongue irritation   Spironolactone  Other (See Comments)    Caused stroke-like symptoms with the mouth   Corticosteroids Rash    States she developed a rash on her tongue after a steroid injection in her back    Level of Care/Admitting Diagnosis ED Disposition     ED Disposition  Admit   Condition  --   Comment  Hospital Area: [facility] [[id number]]  Level of Care: Telemetry [5]  Admit to tele based on following criteria: Monitor for Ischemic changes  May place patient in observation at [facility] or [facility] if equivalent level of care is available:: No  Covid Evaluation: Asymptomatic - no recent exposure (last 10 days) testing not required  Diagnosis: Supratherapeutic INR [829562]  Admitting Physician: HOWERTER, JUSTIN B [[id number]]  Attending Physician: HOWERTER, JUSTIN B [[id number]]          B Medical/Surgery History Past Medical History:  Diagnosis Date   Adrenal mass (HCC) 03/226    Benign   Arthritis    knees/multiple orthopedic conditons; lower back   Asthma    per pt   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   COPD (chronic obstructive pulmonary disease) (HCC)    inhalers dependent on environment   Depression    Diabetes mellitus    120s usually fasting -  dx more than 10 yrs ago   Dizziness, nonspecific    DVT (deep venous thrombosis) (HCC)    Recurrent   Fibromyalgia    GERD (gastroesophageal reflux disease)    Heart murmur    History of blood transfusion    History of cocaine abuse (HCC)    Remote history    Hyperlipidemia    Hypertension    takes meds daily   Hypothyroidism    Lupus Anticoagulant Positive    On home oxygen  therapy    at night   Shortness of breath    exertion or lying flat   Sleep apnea    2l of oxygen  at night (as of 12/6, she used to)   Past Surgical History:  Procedure Laterality Date   ABDOMINAL HYSTERECTOMY  1989   Fibroids   ANTERIOR LUMBAR FUSION  01/03/2012   Procedure: ANTERIOR LUMBAR FUSION 1 LEVEL;  Surgeon: Pasty Bongo, MD;  Location: [facility];  Service: Neurosurgery;  Laterality: N/A;  Lumbar Four-Five Anterior Lumbar Interbody Fusion with Instrumentation   BACK SURGERY     cervical spine---disk disease   CARPAL TUNNEL RELEASE  Bilateral   CESAREAN SECTION     CHOLECYSTECTOMY     CYSTOSCOPY/RETROGRADE/URETEROSCOPY/STONE EXTRACTION WITH BASKET Right 04/26/2014   Procedure: CYSTOSCOPY/ RIGHT RETROGRADE/ RIGHT URETEROSCOPY/URETERAL AND RENAL PELVIS BIOPSY;  Surgeon: Osborn Blaze, MD;  Location: [facility];  Service: Urology;  Laterality: Right;   ESOPHAGOGASTRODUODENOSCOPY (EGD) WITH PROPOFOL  N/A 08/25/2022   Procedure: ESOPHAGOGASTRODUODENOSCOPY (EGD) WITH PROPOFOL ;  Surgeon: Alvis Jourdain, MD;  Location: [facility];  Service: Gastroenterology;  Laterality: N/A;   gallstones removed     HEMOSTASIS CLIP PLACEMENT  08/25/2022   Procedure: HEMOSTASIS CLIP PLACEMENT;  Surgeon: Alvis Jourdain, MD;  Location: [facility];  Service: Gastroenterology;;   HOT HEMOSTASIS N/A 08/25/2022   Procedure: HOT HEMOSTASIS (ARGON PLASMA COAGULATION/BICAP);  Surgeon: Alvis Jourdain, MD;  Location: [facility];  Service: Gastroenterology;  Laterality: N/A;   POLYPECTOMY  08/25/2022   Procedure: POLYPECTOMY;  Surgeon: Alvis Jourdain, MD;  Location: [facility];  Service: Gastroenterology;;   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   bilateral   right elbow surgery     RIGHT HEART CATH N/A 02/28/2021   Procedure: RIGHT HEART CATH;  Surgeon: Odie Benne, MD;  Location: [facility] INVASIVE CV LAB;  Service: Cardiovascular;  Laterality: N/A;   THYROID  LOBECTOMY Right 02/27/2017   THYROID  LOBECTOMY Right 02/27/2017   Procedure: RIGHT THYROID  LOBECTOMY;  Surgeon: Sim Dryer, MD;  Location: [facility];  Service: General;  Laterality: Right;   TUBAL LIGATION       A IV Location/Drains/Wounds Patient Lines/Drains/Airways Status     Active Line/Drains/Airways     Name Placement date Placement time Site Days   Peripheral IV 07/22/23 20 G 1" Right Antecubital 07/22/23  2100  Antecubital  1            Intake/Output Last 24 hours No intake or output data in the 24 hours ending 07/23/23 0044  Labs/Imaging Results for orders placed or performed during the hospital encounter of 07/22/23 (from the past 48 hours)  CBC with Differential     Status: Abnormal   Collection Time: 07/22/23  9:02 PM  Result Value Ref Range   WBC 8.0 4.0 - 10.5 K/uL   RBC 4.04 3.87 - 5.11 MIL/uL   Hemoglobin 10.7 (L) 12.0 - 15.0 g/dL   HCT 16.1 (L) 09.6 - 04.5 %   MCV 83.4 80.0 - 100.0 fL   MCH 26.5 26.0 - 34.0 pg   MCHC 31.8 30.0 - 36.0 g/dL   RDW 40.9 (H) 81.1 - 91.4 %   Platelets 380 150 - 400 K/uL   nRBC 0.0 0.0 - 0.2 %   Neutrophils Relative % 58 %   Neutro Abs 4.6 1.7 - 7.7 K/uL   Lymphocytes Relative 31 %   Lymphs Abs 2.5 0.7 - 4.0 K/uL   Monocytes Relative 8 %   Monocytes Absolute 0.6 0.1 - 1.0 K/uL    Eosinophils Relative 3 %   Eosinophils Absolute 0.2 0.0 - 0.5 K/uL   Basophils Relative 0 %   Basophils Absolute 0.0 0.0 - 0.1 K/uL   Immature Granulocytes 0 %   Abs Immature Granulocytes 0.03 0.00 - 0.07 K/uL    Comment: Performed at [facility], [address], [place], [place] [address]  Comprehensive metabolic panel     Status: Abnormal   Collection Time: 07/22/23  9:02 PM  Result Value Ref Range   Sodium 139 135 - 145 mmol/L   Potassium 4.0 3.5 - 5.1 mmol/L   Chloride 102 98 - 111 mmol/L  CO2 26 22 - 32 mmol/L   Glucose, Bld 133 (H) 70 - 99 mg/dL    Comment: Glucose reference range applies only to samples taken after fasting for at least 8 hours.   BUN 14 8 - 23 mg/dL   Creatinine, Ser 1.61 0.44 - 1.00 mg/dL   Calcium  7.7 (L) 8.9 - 10.3 mg/dL   Total Protein 8.2 (H) 6.5 - 8.1 g/dL   Albumin  3.6 3.5 - 5.0 g/dL   AST 19 15 - 41 U/L   ALT 19 0 - 44 U/L   Alkaline Phosphatase 76 38 - 126 U/L   Total Bilirubin 0.3 0.0 - 1.2 mg/dL   GFR, Estimated >09 >60 mL/min    Comment: (NOTE) Calculated using the CKD-EPI Creatinine Equation (2021)    Anion gap 11 5 - 15    Comment: Performed at [facility], [address] [place], [place] [address]  Protime-INR     Status: Abnormal   Collection Time: 07/22/23  9:02 PM  Result Value Ref Range   Prothrombin Time 56.5 (H) 11.4 - 15.2 seconds   INR 6.4 (HH) 0.8 - 1.2    Comment: CRITICAL RESULT CALLED TO, READ BACK BY AND VERIFIED WITH: C. HALO, RN 2354 07/22/23 BY V. NICANOR (NOTE) INR [place] varies based on device and disease states. Performed at [facility], [address] [place], [place] [address]   CK     Status: None   Collection Time: 07/22/23  9:02 PM  Result Value Ref Range   Total CK 201 38 - 234 U/L    Comment: Performed at [facility], [address] [place], [place] [address]  Brain natriuretic peptide     Status: None   Collection Time:  07/22/23  9:02 PM  Result Value Ref Range   B Natriuretic Peptide 39.4 0.0 - 100.0 pg/mL    Comment: Performed at [facility], [address], [place], [place] [address]  Troponin I (High Sensitivity)     Status: None   Collection Time: 07/22/23  9:02 PM  Result Value Ref Range   Troponin I (High Sensitivity) 10 <18 ng/L    Comment: (NOTE) Elevated high sensitivity troponin I (hsTnI) values and significant  changes across serial measurements may suggest ACS but many other  chronic and acute conditions are known to elevate hsTnI results.  Refer to the "Links" section for chest pain algorithms and additional  guidance. Performed at [facility], [address], [place], [place] [address]    *Note: Due to a large number of results and/or encounters for the requested time period, some results have not been displayed. A complete set of results can be found in Results Review.   DG Chest Portable 1 View Result Date: 07/22/2023 CLINICAL DATA:  Bruising right shoulder and shin. EXAM: PORTABLE CHEST 1 VIEW COMPARISON:  08/23/2022 FINDINGS: The heart size and mediastinal contours are within normal limits. Both lungs are clear. The visualized skeletal structures are unremarkable. IMPRESSION: No active disease. Electronically Signed   By: Janeece Mechanic M.D.   On: 07/22/2023 21:10   DG Tibia/Fibula Right Result Date: 07/22/2023 CLINICAL DATA:  Right shin bruising. EXAM: RIGHT TIBIA AND FIBULA - 2 VIEW COMPARISON:  None Available. FINDINGS: Prior right knee replacement. No visible hardware complicating feature. No acute bony abnormality. Specifically, no fracture, subluxation, or dislocation. Soft tissue swelling in the anterior shin. IMPRESSION: No acute bony abnormality. Electronically Signed   By:  Janeece Mechanic M.D.   On: 07/22/2023 21:09   DG Shoulder Right Result Date: 07/22/2023 CLINICAL DATA:  Right shoulder bruising. EXAM: RIGHT SHOULDER - 2+ VIEW COMPARISON:   None Available. FINDINGS: Degenerative changes in the [facility] joint with joint space narrowing and spurring. Glenohumeral joint is maintained. No acute bony abnormality. Specifically, no fracture, subluxation, or dislocation. Soft tissues are intact. IMPRESSION: Degenerative changes in the right AC joint. No acute bony abnormality. Electronically Signed   By: Janeece Mechanic M.D.   On: 07/22/2023 21:08    Pending Labs Unresulted Labs (From admission, onward)     Start     Ordered   07/22/23 2042  Type and screen [facility]  Once,   STAT       Comments: [facility]    07/22/23 2041            Vitals/Pain Today's Vitals   07/22/23 2245 07/22/23 2330 07/22/23 2341 07/22/23 2341  BP: (!) 194/102 (!) 160/107    Pulse: 82 82    Resp: (!) 21 16    Temp:    98.2 F (36.8 C)  TempSrc:    Oral  SpO2: 97% 100%    Weight:      Height:      PainSc:   7      Isolation Precautions No active isolations  Medications Medications  sodium chloride  0.9 % bolus 500 mL (0 mLs Intravenous Stopped 07/22/23 2232)  fentaNYL  (SUBLIMAZE ) injection 50 mcg (50 mcg Intravenous Given 07/22/23 2125)  fentaNYL  (SUBLIMAZE ) injection 50 mcg (50 mcg Intravenous Given 07/22/23 2230)  phytonadione  (VITAMIN K) tablet 2.5 mg (2.5 mg Oral Given 07/23/23 0040)    Mobility walks     Focused Assessments Patient came in for leg pain increasing making mobility harder. Patient is alert and oriented x4. Patient has some noticeable bruising on her upper right arm and leg. No complaints of falls recently that could contribute to the bruising. Patient has had history of elevated INR that has caused similar symptoms. Patient is on HTN meds and stated she didn't take her night dose before coming in to be seen in the ER today.    R Recommendations: See Admitting Provider Note  Report given to:   Additional Notes:

## 2023-07-23 NOTE — Progress Notes (Signed)
 Progress Note   Patient: [name] ZOX:[id number] DOB: 02-06-1956 DOA: 07/22/2023     0 DOS: the patient was seen and examined on 07/23/2023   Brief hospital course: [name] is a 68 y.o. 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   Assessment and Plan: Supra therapeutic INR INR 5.6 today. She got 2.5 vit K yesterday. Will give 5mg  vit K oral dose. Repeat INR  tomorrow. Continue to hold Coumadin .  PT/ OT from tomorrow.  Monitor for bleeding, H/H.  Right lower extremity pain, swelling. High risk for bleeding given high INR. Does have pain but clinically not consistent with compartment syndrome. Continue pain control with tylenol , muscle relaxants, opiates.  Acute on chronic anemia In the setting of high INR. No evidence of acute blood loss. Right leg swelling, pain noted, if Hb drop significant, will get CT right leg for active bleeding, surgical consult. Continue to hold Coumadin . Iron  profile reviewed.  Repeat Hb tonight 5pm ordered.  Type 2 diabetes mellitus- Accucheks, sliding scale as per floor protocol. Continue gabapentin .  Chronic diastolic heart failure- Patient is euvolemic.  GDMT on hold, Lasix  on hold due to low blood pressures. Continue to monitor vitals closely.  COPD: No exacerbation. Continue home inhalers, DuoNebs as needed.  Essential hypertension- BP lower side. Home BP meds held, advised to limit opiate medications.  Hyperlipidemia-continue statin therapy.  Allergic rhinitis-continue home Flonase  and Allegra .  Morbid obesity BMI 46.32 Diet, excise and weight reduction advised.     Out  of bed to chair. Incentive spirometry. Nursing supportive care. Fall, aspiration precautions. Diet:  Diet Orders (From admission, onward)     Start     Ordered   07/23/23 0933  Diet full liquid Room service appropriate? Yes; Fluid consistency: Thin  Diet effective now       Question Answer Comment  Room service appropriate? Yes   Fluid consistency: Thin      07/23/23 0932           DVT prophylaxis: SCDs Start: 07/23/23 0059  Level of care: Telemetry   Code Status: Full Code  Subjective: Patient is seen and examined today morning.  She is lying comfortably.  Does have right leg swelling, pain.  Did not get out of bed.  Physical Exam: Vitals:   07/23/23 0140 07/23/23 0513 07/23/23 0750 07/23/23 0855  BP: (!) 168/81 (!) 175/84  (!) 160/85  Pulse: 84 84  88  Resp:  18    Temp: 98.3 F (36.8 C) 97.8 F (36.6 C)  98 F (36.7 C)  TempSrc: Oral Oral  Oral  SpO2: 97% 95% 94% 90%  Weight:      Height:        General - Elderly morbidly obese African-American female, no apparent distress HEENT - PERRLA, EOMI, atraumatic head, non tender sinuses. Lung - Clear, basilar rales, rhonchi, wheezes. Heart - S1, S2 heard, no murmurs, rubs, right greater than left pedal edema. Abdomen - Soft, non tender, obese, bowel sounds good Neuro - Alert, awake and oriented x 3, non focal exam. Skin - Warm and dry.  Right leg swelling, bruise, erythema  Data Reviewed:      Latest Ref Rng & Units 07/23/2023  9:35 AM 07/23/2023    5:21 AM 07/22/2023    9:02 PM  CBC  WBC 4.0 - 10.5 K/uL  7.3  8.0   Hemoglobin 12.0 - 15.0 g/dL 82.9  56.2  13.0   Hematocrit 36.0 - 46.0 % 34.3  33.8  33.7   Platelets 150 - 400 K/uL  356  380       Latest Ref Rng & Units 07/23/2023    5:21 AM 07/22/2023    9:02 PM 07/11/2023   10:50 AM  BMP  Glucose 70 - 99 mg/dL 865  784  696   BUN 8 - 23 mg/dL 15  14  18    Creatinine 0.44 - 1.00 mg/dL 2.95  2.84  1.32   Sodium 135 - 145 mmol/L 141  139  139   Potassium 3.5  - 5.1 mmol/L 4.0  4.0  4.0   Chloride 98 - 111 mmol/L 106  102  102   CO2 22 - 32 mmol/L 24  26  26    Calcium  8.9 - 10.3 mg/dL 7.9  7.7  9.1    DG Chest Portable 1 View Result Date: 07/22/2023 CLINICAL DATA:  Bruising right shoulder and shin. EXAM: PORTABLE CHEST 1 VIEW COMPARISON:  08/23/2022 FINDINGS: The heart size and mediastinal contours are within normal limits. Both lungs are clear. The visualized skeletal structures are unremarkable. IMPRESSION: No active disease. Electronically Signed   By: Janeece Mechanic M.D.   On: 07/22/2023 21:10   DG Tibia/Fibula Right Result Date: 07/22/2023 CLINICAL DATA:  Right shin bruising. EXAM: RIGHT TIBIA AND FIBULA - 2 VIEW COMPARISON:  None Available. FINDINGS: Prior right knee replacement. No visible hardware complicating feature. No acute bony abnormality. Specifically, no fracture, subluxation, or dislocation. Soft tissue swelling in the anterior shin. IMPRESSION: No acute bony abnormality. Electronically Signed   By: Janeece Mechanic M.D.   On: 07/22/2023 21:09   DG Shoulder Right Result Date: 07/22/2023 CLINICAL DATA:  Right shoulder bruising. EXAM: RIGHT SHOULDER - 2+ VIEW COMPARISON:  None Available. FINDINGS: Degenerative changes in the [facility] joint with joint space narrowing and spurring. Glenohumeral joint is maintained. No acute bony abnormality. Specifically, no fracture, subluxation, or dislocation. Soft tissues are intact. IMPRESSION: Degenerative changes in the right AC joint. No acute bony abnormality. Electronically Signed   By: Janeece Mechanic M.D.   On: 07/22/2023 21:08    Family Communication: Discussed with patient, understand and agree. All questions answered.  Disposition: Status is: Observation The patient will require care spanning > 2 midnights and should be moved to inpatient because: Supratherapeutic INR, high risk for bleeding, hemoglobin monitoring, PT evaluation  Planned Discharge Destination: Home with Home Health     Time spent: 40  minutes  Author: Aisha Hove, MD 07/23/2023 1:19 PM Secure chat 7am to 7pm For on call review [email or web address].

## 2023-07-23 NOTE — Care Management Obs Status (Signed)
 MEDICARE OBSERVATION STATUS NOTIFICATION   Patient Details  Name: [name] MRN: [id number] Date of Birth: August 31, 1955   Medicare Observation Status Notification Given:  Yes    Tessie Fila, RN 07/23/2023, 2:09 PM

## 2023-07-23 NOTE — Progress Notes (Signed)
   07/23/23 1351  TOC Brief Assessment  Insurance and Status Reviewed  Patient has primary care physician Yes  Home environment has been reviewed Single family home  Prior level of function: Independent  Prior/Current Home Services No current home services  Readmission risk has been reviewed Yes (N/A)  Transition of care needs transition of care needs identified, TOC will continue to follow   Pt has no TOC needs at this time. TOC will continue to follow for any new recommendations or needs.

## 2023-07-23 NOTE — Plan of Care (Signed)

## 2023-07-23 NOTE — Progress Notes (Signed)
 Pt arrived to unit via stretcher room 1514. A&O x 4. Stood and pivoted to bed.  Gait unsteady. Oriented to room and callbell with no complications.. Initial assessment and 2 RN assessment completed. Pain 8/10 to R shin and shoulder. Plan of care ongoing

## 2023-07-23 NOTE — Plan of Care (Signed)

## 2023-07-23 NOTE — H&P (Signed)
 History and Physical      [name] WUJ:[id number] DOB: [birth year] DOA: 07/22/2023; DOS: 07/23/2023  PCP: Viola Greulich, MD  Patient coming from: home   I have personally briefly reviewed patient's old medical records in [facility]  Chief Complaint: Right lower extremity bruising  HPI: [name] is a 68 y.o. female with medical history significant for inheritable hypercoagulable state with positive lupus anticoagulant antibodies, recurrent DVTs, type 2 diabetes mellitus, COPD, chronic diastolic heart failure, who is admitted to [facility] on 07/22/2023 with supratherapeutic INR with acute blood loss anemia after presenting from home to [facility] ED complaining of right lower extremity bruising.   After bumping the anterior aspect of the right lower extremity distal to the right knee approximately 2 to 3 days ago, the patient reports ensuing development of bruising along the anterior aspect of the right lower extremity distal to the right knee.  Has been associated with tenderness, some mild swelling, but no drainage.  She denies any associated acute focal weakness or acute focal numbness/paresthesias involving the right lower extremity.  Denies significant bruising in any other location.  She denies any recent melena, hematochezia, hematemesis, or gross hematuria.  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  Recent medication changes include an increase in dose of evening Seroquel  from 50 mg to 150 mg p.o. nightly approximately 2 weeks ago.  In June 2024, she was hospitalized within the [facility] health [facility] for acute gastrointestinal bleed, at which time her INR was noted to be 7.0.  Aside from warfarin, she is not on any additional blood thinners at home, including no aspirin .  Per chart review, her baseline hemoglobin range  appears to be 11.5-12.5.    ED Course:  Vital signs in the ED were notable for the following: Afebrile; heart rates in the 80s to 97.  Blood pressures in the 160s to 190s; [place] rate 16-21, oxygen  saturation 97 to 100% on room air.  Labs were notable for the following: CMP notable for the following: BUN 14, creatinine 0.84, glucose 133, calcium  adjusted for mild hypoalbuminemia noted to be 8.1, avidin 3.6, liver enzymes were otherwise within normal limits.  BNP 39, CPK 201.  CBC notable for white blood cell count 8000, hemoglobin 10.7 associated Neuraceq/Norocarp properties and relative to most recent prior hemoglobin data point of 11.6 on 12/11/2023, platelet count 380.  INR 6.4.  Type and screen completed.  Per my interpretation, EKG in ED demonstrated the following: Sinus rhythm with heart rate 82, normal intervals, no evidence of T wave or ST changes, including no evidence of ST elevation.  Imaging in the ED, per corresponding formal radiology read, was notable for the following: 1 view chest x-ray shows no evidence of acute cardiopulmonary process, including no evidence of infiltrate, edema, effusion, or pneumothorax.  Plain films of the right tib-fib showed no evidence of acute bony abnormality We will showing soft tissue swelling in the anterior shin without evidence of subcutaneous gas.  While in the ED, the following were administered: Fentanyl  50 mcg IV x 2 doses, vitamin K 2.5 mg p.o. x 1 dose, normal saline x 500 cc bolus.  Subsequently, the patient was admitted for further evaluation management of presenting supratherapeutic INR complicated by acute blood loss anemia after presenting with acute right lower extremity pain and bruising.    Review of Systems: As per HPI otherwise 10  point review of systems negative.   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   COPD (chronic obstructive pulmonary disease) (HCC)    inhalers dependent on environment   Depression    Diabetes mellitus    120s usually fasting -  dx more than 10 yrs ago   Dizziness, nonspecific    DVT (deep venous thrombosis) (HCC)    Recurrent   Fibromyalgia    GERD (gastroesophageal reflux disease)    Heart murmur    History of blood transfusion    History of cocaine abuse (HCC)    Remote history    Hyperlipidemia    Hypertension    takes meds daily   Hypothyroidism    Lupus Anticoagulant Positive    On home oxygen  therapy    at night   Shortness of breath    exertion or lying flat   Sleep apnea    2l of oxygen  at night (as of 12/6, she used to)    Past Surgical History:  Procedure Laterality Date   ABDOMINAL HYSTERECTOMY  1989   Fibroids   ANTERIOR LUMBAR FUSION  01/03/2012   Procedure: ANTERIOR LUMBAR FUSION 1 LEVEL;  Surgeon: Pasty Bongo, MD;  Location: [facility];  Service: Neurosurgery;  Laterality: N/A;  Lumbar Four-Five Anterior Lumbar Interbody Fusion with Instrumentation   BACK SURGERY     cervical spine---disk disease   CARPAL TUNNEL RELEASE     Bilateral   CESAREAN SECTION     CHOLECYSTECTOMY     CYSTOSCOPY/RETROGRADE/URETEROSCOPY/STONE EXTRACTION WITH BASKET Right 04/26/2014   Procedure: CYSTOSCOPY/ RIGHT RETROGRADE/ RIGHT URETEROSCOPY/URETERAL AND RENAL PELVIS BIOPSY;  Surgeon: Osborn Blaze, MD;  Location: [facility];  Service: Urology;  Laterality: Right;   ESOPHAGOGASTRODUODENOSCOPY (EGD) WITH PROPOFOL  N/A 08/25/2022   Procedure: ESOPHAGOGASTRODUODENOSCOPY (EGD) WITH PROPOFOL ;  Surgeon: Alvis Jourdain, MD;  Location: [facility];  Service: Gastroenterology;  Laterality: N/A;   gallstones removed     HEMOSTASIS CLIP PLACEMENT  08/25/2022   Procedure: HEMOSTASIS CLIP PLACEMENT;  Surgeon: Alvis Jourdain, MD;  Location: [facility];  Service: Gastroenterology;;   HOT HEMOSTASIS N/A 08/25/2022    Procedure: HOT HEMOSTASIS (ARGON PLASMA COAGULATION/BICAP);  Surgeon: Alvis Jourdain, MD;  Location: [facility];  Service: Gastroenterology;  Laterality: N/A;   POLYPECTOMY  08/25/2022   Procedure: POLYPECTOMY;  Surgeon: Alvis Jourdain, MD;  Location: [facility];  Service: Gastroenterology;;   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   bilateral   right elbow surgery     RIGHT HEART CATH N/A 02/28/2021   Procedure: RIGHT HEART CATH;  Surgeon: Odie Benne, MD;  Location: [facility] INVASIVE CV LAB;  Service: Cardiovascular;  Laterality: N/A;   THYROID  LOBECTOMY Right 02/27/2017   THYROID  LOBECTOMY Right 02/27/2017   Procedure: RIGHT THYROID  LOBECTOMY;  Surgeon: Sim Dryer, MD;  Location: [facility] OR;  Service: General;  Laterality: Right;   TUBAL LIGATION      Social History:  reports that she has never smoked. She has never used smokeless tobacco. She reports current drug use. Drug: Cocaine. She reports that she does not drink alcohol.   Allergies  Allergen Reactions   Lisinopril  Anaphylaxis, Swelling and Other (See Comments)    THROAT SWELLING  Pt states her throat started to "close up" after being on it for "so  long."  THROAT SWELLING, Pt states her throat started to "close up" after being on it for "so long."   Prednisone  Anaphylaxis, Swelling and Other (See Comments)    Throat swelling and tongue irritation   Spironolactone  Other (See Comments)    Caused stroke-like symptoms with the mouth   Corticosteroids Rash    States she developed a rash on her tongue after a steroid injection in her back    Family History  Problem Relation Age of Onset   Hypertension Mother    Diabetes Mother    Cancer Mother        Pancreatic   Hypertension Father    Heart failure Father    Hyperlipidemia Other    COPD Other     Family history reviewed and not pertinent    Prior to Admission medications   Medication Sig Start Date End Date Taking? Authorizing Provider  ACCU-CHEK GUIDE TEST  test strip USE TO TEST BLOOD SUGAR UP TO FOUR TIMES A DAY AS DIRECTED 03/19/23  Yes Viola Greulich, MD  Accu-Chek Softclix Lancets lancets USE TO TEST BLOOD SUGAR UP TO FOUR TIMES A DAY AS DIRECTED 06/22/23  Yes Viola Greulich, MD  albuterol  (PROVENTIL ) (2.5 MG/3ML) 0.083% nebulizer solution USE 1 VIAL IN NEBULIZER EVERY 6 HOURS - and as needed 06/12/23  Yes Viola Greulich, MD  albuterol  (VENTOLIN  HFA) 108 (90 Base) MCG/ACT inhaler INHALE TWO (2) PUFFS BY MOUTH EVERY 4 HOURS AS NEEDED FOR SHORTNESS OF BREATH 02/13/23  Yes Viola Greulich, MD  Blood Glucose Monitoring Suppl (ACCU-CHEK GUIDE) w/Device KIT USE TO TEST BLOOD SUGAR 3 TIMES DAILY 07/09/23  Yes Viola Greulich, MD  Blood Glucose Monitoring Suppl DEVI 1 each by Does not apply route in the morning, at noon, and at bedtime. May substitute to any manufacturer covered by patient's insurance. 06/11/23  Yes Viola Greulich, MD  canagliflozin  (INVOKANA ) 100 MG TABS tablet Take 1 tablet (100 mg total) by mouth daily before breakfast. 11/10/22  Yes Chandrasekhar, [name] A, MD  cloNIDine  (CATAPRES ) 0.1 MG tablet TAKE ONE (1) TABLET BY MOUTH TWICE DAILY BEFORE BREAKFAST AND AT BEDTIME 03/19/23  Yes Viola Greulich, MD  diclofenac sodium (VOLTAREN) 1 % GEL Apply 2 g topically daily as needed (fibromyalgia pain- affected sites).   Yes [provider]  diltiazem  (CARDIZEM  CD) 240 MG 24 hr capsule TAKE 1 CAPSULE BY MOUTH DAILY BEFORE BREAKFAST *REFILL REQUEST* 11/10/22  Yes Viola Greulich, MD  DULoxetine  (CYMBALTA ) 30 MG capsule Take 90 mg by mouth at bedtime.   Yes [provider]  FEROSUL 325 (65 Fe) MG tablet TAKE ONE TABLET BY MOUTH ONCE DAILY 07/19/21  Yes Viola Greulich, MD  fexofenadine  (ALLEGRA ) 180 MG tablet Take 1 tablet (180 mg total) by mouth daily. 03/05/23  Yes Viola Greulich, MD  fluticasone  (FLONASE ) 50 MCG/ACT nasal spray INSTILL 2 SPRAYS IN EACH NOSTRIL TWICE DAILY 06/25/23  Yes Viola Greulich, MD  furosemide   (LASIX ) 80 MG tablet TAKE ONE (1) TABLET BY MOUTH TWICE DAILY 01/12/23  Yes Chandrasekhar, [name] A, MD  gabapentin  (NEURONTIN ) 300 MG capsule TAKE ONE (1) CAPSULE BY MOUTH TWICE DAILY 06/13/23  Yes Viola Greulich, MD  HYDROcodone -acetaminophen  (NORCO) 5-325 MG tablet Take 1 tablet by mouth every 6 (six) hours as needed for moderate pain (pain score 4-6). 04/04/23  Yes Donley Furth, MD  Incontinence Supply Disposable (PROCARE BARIATRIC BRIEFS) MISC Use as needed. 03/05/23  Yes Viola Greulich,  MD  lidocaine  (LIDODERM ) 5 % APPLY 1 PATCH TOPICALLY ON THE SKIN DAILY AS NEEDED TO PAINFUL SITE *12 HOURS ON AND 12 HOURS OFF OR AS DIRECTED BY MD* 07/12/23  Yes Viola Greulich, MD  LINZESS  145 MCG CAPS capsule TAKE 1 CAPSULE BY MOUTH EVERY OTHER DAY 02/28/23  Yes Viola Greulich, MD  magic mouthwash (multi-ingredient) oral suspension Swish and spit 5 mLs 4 (four) times daily as needed for mouth pain. 07/18/23  Yes Viola Greulich, MD  metFORMIN  (GLUCOPHAGE ) 500 MG tablet TAKE 1 TABLET BY MOUTH EVERY DAY AT BEDTIME *REFILL REQUEST* 11/10/22  Yes Viola Greulich, MD  methocarbamol  (ROBAXIN ) 500 MG tablet Take 1 tablet (500 mg total) by mouth 2 (two) times daily. Patient taking differently: Take 500 mg by mouth 2 (two) times daily as needed for muscle spasms. 03/22/22  Yes Blue, Soijett A, PA-C  omeprazole  (PRILOSEC) 20 MG capsule Take 1 capsule (20 mg total) by mouth 2 (two) times daily before a meal. 02/13/23  Yes Viola Greulich, MD  potassium chloride  (MICRO-K ) 10 MEQ CR capsule TAKE TWO (2) CAPSULES BY MOUTH EVERY DAY AT BEDTIME 03/19/23  Yes Viola Greulich, MD  QUEtiapine  (SEROQUEL ) 100 MG tablet Take 150 mg by mouth at bedtime. 07/11/23  Yes [provider]  rosuvastatin  (CRESTOR ) 10 MG tablet Take 1 tablet (10 mg total) by mouth daily. 02/02/23 07/23/23 Yes Chandrasekhar, [name] A, MD  Semaglutide , 2 MG/DOSE, (OZEMPIC , 2 MG/DOSE,) 8 MG/3ML SOPN Inject 2 mg into the skin once a week. 12/22/22   Yes Viola Greulich, MD  SYMBICORT  160-4.5 MCG/ACT inhaler INHALE TWO (2) PUFFS BY MOUTH TWICE DAILY 03/19/23  Yes Viola Greulich, MD  traMADol  (ULTRAM ) 50 MG tablet Take 1 tablet (50 mg total) by mouth 3 (three) times daily as needed. 01/18/23  Yes Arnie Lao, MD  warfarin (COUMADIN ) 10 MG tablet Take daily as directed by MD 01/12/23  Yes Sumner Ends, MD  warfarin (COUMADIN ) 7.5 MG tablet TAKE 1 TABLET BY MOUTH DAILY 02/12/23  Yes Sumner Ends, MD  zonisamide  (ZONEGRAN ) 100 MG capsule TAKE 1 CAPSULE BY MOUTH EACH MORNING AND TAKE 2 CAPSULES IN THE EVENING 04/19/23  Yes Viola Greulich, MD  Glucose Blood (BLOOD GLUCOSE TEST STRIPS) STRP 1 each by In Vitro route in the morning, at noon, and at bedtime. May substitute to any manufacturer covered by patient's insurance. 06/11/23   Viola Greulich, MD  Lancets Misc. MISC May substitute to any manufacturer covered by patient's insurance. 06/11/23   Viola Greulich, MD  QUEtiapine  (SEROQUEL ) 50 MG tablet Take 50 mg by mouth in the morning and at bedtime. Patient not taking: Reported on 07/23/2023    [provider]  spironolactone  (ALDACTONE ) 25 MG tablet TAKE 1/2 TABLET BY MOUTH ONCE DAILY Patient not taking: Reported on 07/23/2023 10/27/22   Gloriann Larger A, MD  Vitamin D , Ergocalciferol , (DRISDOL ) 1.25 MG (50000 UNIT) CAPS capsule Take 1 capsule (50,000 Units total) by mouth every 7 (seven) days. Patient not taking: Reported on 07/23/2023 07/13/23   Viola Greulich, MD     Objective    Physical Exam: Vitals:   07/22/23 2230 07/22/23 2245 07/22/23 2330 07/22/23 2341  BP: (!) 206/102 (!) 194/102 (!) 160/107   Pulse: 82 82 82   Resp: 19 (!) 21 16   Temp:    98.2 F (36.8 C)  TempSrc:    Oral  SpO2: 99% 97% 100%   Weight:  Height:        General: appears to be stated age; alert, oriented Skin: warm, dry, bruising associated the anterior aspect of the right lower extremity distal to the right  knee Head:  AT/[place] Mouth:  Oral mucosa membranes appear moist, normal dentition Neck: supple; trachea midline Heart:  RRR; did not appreciate any M/R/G Lungs: CTAB, did not appreciate any wheezes, rales, or rhonchi Abdomen: + BS; soft, ND, NT Vascular: 2+ pedal pulses b/l; 2+ radial pulses b/l Extremities: no muscle wasting; bruising associated anterior aspect of the right lower extremity distal to the right knee swelling, but in the absence of increased warmth.  Compartments appear soft. Neuro: strength and sensation intact in upper and lower extremities b/l    Labs on Admission: I have personally reviewed following labs and imaging studies  CBC: Recent Labs  Lab 07/22/23 2102  WBC 8.0  NEUTROABS 4.6  HGB 10.7*  HCT 33.7*  MCV 83.4  PLT 380   Basic Metabolic Panel: Recent Labs  Lab 07/22/23 2102  NA 139  K 4.0  CL 102  CO2 26  GLUCOSE 133*  BUN 14  CREATININE 0.84  CALCIUM  7.7*   GFR: Estimated Creatinine Clearance: 88.7 mL/min (by C-G formula based on SCr of 0.84 mg/dL). Liver Function Tests: Recent Labs  Lab 07/22/23 2102  AST 19  ALT 19  ALKPHOS 76  BILITOT 0.3  PROT 8.2*  ALBUMIN  3.6   No results for input(s): "LIPASE", "AMYLASE" in the last 168 hours. No results for input(s): "AMMONIA" in the last 168 hours. Coagulation Profile: Recent Labs  Lab 07/22/23 2102  INR 6.4*   Cardiac Enzymes: Recent Labs  Lab 07/22/23 2102  CKTOTAL 201   BNP (last 3 results) Recent Labs    07/11/23 1050  PROBNP 18.0   HbA1C: No results for input(s): "HGBA1C" in the last 72 hours. CBG: No results for input(s): "GLUCAP" in the last 168 hours. Lipid Profile: No results for input(s): "CHOL", "HDL", "LDLCALC", "TRIG", "CHOLHDL", "LDLDIRECT" in the last 72 hours. Thyroid  Function Tests: No results for input(s): "TSH", "T4TOTAL", "FREET4", "T3FREE", "THYROIDAB" in the last 72 hours. Anemia Panel: No results for input(s): "VITAMINB12", "FOLATE", "FERRITIN",  "TIBC", "IRON ", "RETICCTPCT" in the last 72 hours. Urine analysis:    Component Value Date/Time   COLORURINE YELLOW 09/02/2022 1848   APPEARANCEUR CLEAR 09/02/2022 1848   LABSPEC 1.031 (H) 09/02/2022 1848   PHURINE 8.0 09/02/2022 1848   GLUCOSEU NEGATIVE 09/02/2022 1848   GLUCOSEU NEGATIVE 03/27/2022 1557   HGBUR MODERATE (A) 09/02/2022 1848   BILIRUBINUR neg 04/04/2023 1515   KETONESUR 5 (A) 09/02/2022 1848   PROTEINUR Positive (A) 04/04/2023 1515   PROTEINUR 100 (A) 09/02/2022 1848   UROBILINOGEN 0.2 04/04/2023 1515   UROBILINOGEN 0.2 03/27/2022 1557   NITRITE neg 04/04/2023 1515   NITRITE NEGATIVE 09/02/2022 1848   LEUKOCYTESUR Negative 04/04/2023 1515   LEUKOCYTESUR NEGATIVE 09/02/2022 1848    Radiological Exams on Admission: DG Chest Portable 1 View Result Date: 07/22/2023 CLINICAL DATA:  Bruising right shoulder and shin. EXAM: PORTABLE CHEST 1 VIEW COMPARISON:  08/23/2022 FINDINGS: The heart size and mediastinal contours are within normal limits. Both lungs are clear. The visualized skeletal structures are unremarkable. IMPRESSION: No active disease. Electronically Signed   By: Janeece Mechanic M.D.   On: 07/22/2023 21:10   DG Tibia/Fibula Right Result Date: 07/22/2023 CLINICAL DATA:  Right shin bruising. EXAM: RIGHT TIBIA AND FIBULA - 2 VIEW COMPARISON:  None Available. FINDINGS: Prior right knee replacement.  No visible hardware complicating feature. No acute bony abnormality. Specifically, no fracture, subluxation, or dislocation. Soft tissue swelling in the anterior shin. IMPRESSION: No acute bony abnormality. Electronically Signed   By: Janeece Mechanic M.D.   On: 07/22/2023 21:09   DG Shoulder Right Result Date: 07/22/2023 CLINICAL DATA:  Right shoulder bruising. EXAM: RIGHT SHOULDER - 2+ VIEW COMPARISON:  None Available. FINDINGS: Degenerative changes in the [facility] joint with joint space narrowing and spurring. Glenohumeral joint is maintained. No acute bony abnormality. Specifically, no  fracture, subluxation, or dislocation. Soft tissues are intact. IMPRESSION: Degenerative changes in the right AC joint. No acute bony abnormality. Electronically Signed   By: Janeece Mechanic M.D.   On: 07/22/2023 21:08      Assessment/Plan    Principal Problem:   Supratherapeutic INR Active Problems:   Essential hypertension   DM2 (diabetes mellitus, type 2) (HCC)   Acute pain of right lower extremity   Chronic diastolic CHF (congestive heart failure) (HCC)   Allergic rhinitis   HLD (hyperlipidemia)   Acute blood loss anemia   History of COPD     #) Supratherapeutic INR: In the context of her history of chronic anticoagulation on warfarin for inheritable hypercoagulable state with recurrent DVTs, her presenting INR is found to be supratherapeutic at 6.4.  Suspect contribution from recent increase in dose of Seroquel , with Seroquel  serving as a indirect P4 50 inhibitor.  The patient has some ecchymosis associated with the right lower extremity distal to the right knee, but otherwise no overt evidence of active bleed, including no evidence of acute GI bleed, which is notable, given that she was hospitalized less than a year ago for acute gastrointestinal bleed in the context of supratherapeutic INR.  She has received vitamin K 2.5 mg p.o. x 1 dose in the ED.  Will refrain from further reversal at this time given her underlying hypercoagulable state complicated by recurrent DVTs.  Presentation is also complicated by mild acute blood loss anemia.  Will continue to trend INR and hemoglobin will closely monitor ANC vital sign trend, as outlined below.  Type and screen has been ordered.  Plan: Repeat INR.  Hold next dose of warfarin.  CBC in the morning.  Every 4 hour H&H's ordered through 9 AM on 07/23/2023.  Hold home Seroquel  for now.                  #) Acute blood loss anemia: Relative to the patient's baseline hemoglobin range of 11.5-12.5, her presenting hemoglobin is found to  be slightly lower at 10.7 associated Neuraceq/Norocarp properties.  Suspect an element of acute blood loss in the setting of ecchymosis involving the right lower extremity distal to the right knee, with predisposition for such in the setting of her presenting supratherapeutic INR, as above.  Otherwise, no evidence of source of acute blood loss, including no evidence to suggest acute GI bleed.  She appears hemodynamically stable and underwent reversal of INR in the ED this evening, as above.  No indication for transfusion at this time.  Plan: Add on iron  studies.  Hold home warfarin.  Repeat INR in the morning.  Every 4 hour H&H's, as above.  CBC in the morning.                #) Acute right lower extremity pain: The patient presents with 2 days of right lower extremity discomfort distal to the right knee, with associated ecchymosis in the context of her presenting supratherapeutic INR,  as above.  Plain films of the right tib-fib showed no evidence of acute bony abnormality, will demonstrating some soft tissue swelling in the anterior shin without evidence of subcutaneous gas.  Compartment syndrome appears less likely with her compartments appearing soft.  CPK not elevated, as quantified above right lower extremity appears neurovascularly intact.  If considering significant increase in pain or ensuing significant increase in swelling, may consider nonvascular ultrasound evaluate for drainable fluid collection, including matted hematoma..  Plan: Prn IV fentanyl .  Tylenol  1 g p.o. every 6 hours as needed.  Resume home Robaxin .  I placed nursing communication order, requesting that right lower extremity be elevated.  Further evaluation and management of presenting supratherapeutic INR complicated by acute blood loss anemia, as above.  Resume outpatient gabapentin .                    #) Type 2 Diabetes Mellitus: documented history of such. Home insulin  regimen: None. Home oral  hypoglycemic agents: Metformin , canagliflozin .  Additionally, she is on Ozempic  at home. presenting blood sugar: 133. Most recent A1c noted to be 6.2% when checked on 07/11/2023 complicated by diabetic peripheral polyneuropathy for which she is on gabapentin .  Plan: accuchecks QAC and HS with low dose SSI.  Resume home gabapentin . hold home oral hypoglycemic agents during this hospitalization.  Hold home Ozempic  during this hospitalization.                     #) COPD: Documented history thereof, without clinical evidence of acute exacerbation at this time.   Outpatient respiratory regimen includes the following: Symbicort , as needed albuterol  inhaler.   Plan: cont outpatient Symbicort . Prn albuterol  nebulizer. Check CMP and serum magnesium  level in the AM.                     #) Essential Hypertension: documented h/o such, with outpatient antihypertensive regimen including clonidine , daily diltiazem , Lasix .  SBP's in the ED today: 160s to 190s mmHg. for now, in the context of presenting acute blood loss anemia with supratherapeutic INR, will be conservative with resumption of outpatient hypertensive medications, as detailed below.  Plan: Close monitoring of subsequent BP via routine VS. resume home diltiazem .  Hold next doses of Lasix  and clonidine , as above.  Monitor strict I's and O's and daily weights.                      #) Hyperlipidemia: documented h/o such. On rosuvastatin  as outpatient.  Of note, total CPK level nonelevated, as further quantified above.  Plan: continue home statin.                      #) Allergic Rhinitis: documented h/o such, on scheduled intranasal Flonase  as outpatient in addition to use of Allegra .   Plan: cont home Flonase  and Allegra .  Aaron Aas                          #) Chronic diastolic heart failure: documented history of such, with most recent echocardiogram  performed June 2024, which showed LVEF 65 to 70%, as well as grade 1 diastolic dysfunction. No clinical evidence to suggest acutely decompensated heart failure at this time. home diuretic regimen reportedly consists of the following: Lasix .  In the context of presenting acute blood loss anemia complicated by supratherapeutic INR, will hold next dose of Lasix , closely monitor ensuing volume status, as  outlined below.   Plan: monitor strict I's & O's and daily weights. Repeat CMP in AM. Check serum mag level.  Hold next dose of Lasix , as further detailed above.     DVT prophylaxis: SCD's   Code Status: Full code Family Communication: none Disposition Plan: Per Rounding Team Consults called: none;  Admission status: Observation    I SPENT GREATER THAN 75  MINUTES IN CLINICAL CARE TIME/MEDICAL DECISION-MAKING IN COMPLETING THIS ADMISSION.     Kelin Nixon B Myrta Mercer DO [facility] From 7PM - 7AM   07/23/2023, 1:15 AM

## 2023-07-24 ENCOUNTER — Encounter (HOSPITAL_COMMUNITY)

## 2023-07-24 DIAGNOSIS — J4489 Other specified chronic obstructive pulmonary disease: Secondary | ICD-10-CM | POA: Diagnosis present

## 2023-07-24 DIAGNOSIS — I110 Hypertensive heart disease with heart failure: Secondary | ICD-10-CM | POA: Diagnosis present

## 2023-07-24 DIAGNOSIS — E782 Mixed hyperlipidemia: Secondary | ICD-10-CM | POA: Diagnosis not present

## 2023-07-24 DIAGNOSIS — Z7985 Long-term (current) use of injectable non-insulin antidiabetic drugs: Secondary | ICD-10-CM | POA: Diagnosis not present

## 2023-07-24 DIAGNOSIS — E1142 Type 2 diabetes mellitus with diabetic polyneuropathy: Secondary | ICD-10-CM | POA: Diagnosis present

## 2023-07-24 DIAGNOSIS — Z8709 Personal history of other diseases of the respiratory system: Secondary | ICD-10-CM | POA: Diagnosis not present

## 2023-07-24 DIAGNOSIS — R791 Abnormal coagulation profile: Secondary | ICD-10-CM | POA: Diagnosis not present

## 2023-07-24 DIAGNOSIS — Z888 Allergy status to other drugs, medicaments and biological substances status: Secondary | ICD-10-CM | POA: Diagnosis not present

## 2023-07-24 DIAGNOSIS — I5032 Chronic diastolic (congestive) heart failure: Secondary | ICD-10-CM | POA: Diagnosis present

## 2023-07-24 DIAGNOSIS — I10 Essential (primary) hypertension: Secondary | ICD-10-CM | POA: Diagnosis not present

## 2023-07-24 DIAGNOSIS — D649 Anemia, unspecified: Secondary | ICD-10-CM | POA: Diagnosis not present

## 2023-07-24 DIAGNOSIS — Z7984 Long term (current) use of oral hypoglycemic drugs: Secondary | ICD-10-CM | POA: Diagnosis not present

## 2023-07-24 DIAGNOSIS — E039 Hypothyroidism, unspecified: Secondary | ICD-10-CM | POA: Diagnosis present

## 2023-07-24 DIAGNOSIS — D6862 Lupus anticoagulant syndrome: Secondary | ICD-10-CM | POA: Diagnosis present

## 2023-07-24 DIAGNOSIS — Z9981 Dependence on supplemental oxygen: Secondary | ICD-10-CM | POA: Diagnosis not present

## 2023-07-24 DIAGNOSIS — M797 Fibromyalgia: Secondary | ICD-10-CM | POA: Diagnosis present

## 2023-07-24 DIAGNOSIS — S8011XA Contusion of right lower leg, initial encounter: Secondary | ICD-10-CM | POA: Diagnosis present

## 2023-07-24 DIAGNOSIS — G4730 Sleep apnea, unspecified: Secondary | ICD-10-CM | POA: Diagnosis present

## 2023-07-24 DIAGNOSIS — Z6841 Body Mass Index (BMI) 40.0 and over, adult: Secondary | ICD-10-CM | POA: Diagnosis not present

## 2023-07-24 DIAGNOSIS — Z7951 Long term (current) use of inhaled steroids: Secondary | ICD-10-CM | POA: Diagnosis not present

## 2023-07-24 DIAGNOSIS — Z8249 Family history of ischemic heart disease and other diseases of the circulatory system: Secondary | ICD-10-CM | POA: Diagnosis not present

## 2023-07-24 DIAGNOSIS — F32A Depression, unspecified: Secondary | ICD-10-CM | POA: Diagnosis present

## 2023-07-24 DIAGNOSIS — M79604 Pain in right leg: Secondary | ICD-10-CM | POA: Diagnosis not present

## 2023-07-24 DIAGNOSIS — Z86718 Personal history of other venous thrombosis and embolism: Secondary | ICD-10-CM | POA: Diagnosis not present

## 2023-07-24 DIAGNOSIS — Z79899 Other long term (current) drug therapy: Secondary | ICD-10-CM | POA: Diagnosis not present

## 2023-07-24 DIAGNOSIS — D62 Acute posthemorrhagic anemia: Secondary | ICD-10-CM | POA: Diagnosis present

## 2023-07-24 DIAGNOSIS — E785 Hyperlipidemia, unspecified: Secondary | ICD-10-CM | POA: Diagnosis present

## 2023-07-24 DIAGNOSIS — M79661 Pain in right lower leg: Secondary | ICD-10-CM | POA: Diagnosis not present

## 2023-07-24 DIAGNOSIS — W228XXA Striking against or struck by other objects, initial encounter: Secondary | ICD-10-CM | POA: Diagnosis present

## 2023-07-24 DIAGNOSIS — Z7901 Long term (current) use of anticoagulants: Secondary | ICD-10-CM | POA: Diagnosis not present

## 2023-07-24 DIAGNOSIS — K219 Gastro-esophageal reflux disease without esophagitis: Secondary | ICD-10-CM | POA: Diagnosis present

## 2023-07-24 LAB — CBC
HCT: 33.9 % — ABNORMAL LOW (ref 36.0–46.0)
Hemoglobin: 10.3 g/dL — ABNORMAL LOW (ref 12.0–15.0)
MCH: 26.1 pg (ref 26.0–34.0)
MCHC: 30.4 g/dL (ref 30.0–36.0)
MCV: 86 fL (ref 80.0–100.0)
Platelets: 374 10*3/uL (ref 150–400)
RBC: 3.94 MIL/uL (ref 3.87–5.11)
RDW: 19.6 % — ABNORMAL HIGH (ref 11.5–15.5)
WBC: 9.7 10*3/uL (ref 4.0–10.5)
nRBC: 0.4 % — ABNORMAL HIGH (ref 0.0–0.2)

## 2023-07-24 LAB — PROTIME-INR
INR: 2.2 — ABNORMAL HIGH (ref 0.8–1.2)
Prothrombin Time: 24.3 s — ABNORMAL HIGH (ref 11.4–15.2)

## 2023-07-24 LAB — GLUCOSE, CAPILLARY
Glucose-Capillary: 133 mg/dL — ABNORMAL HIGH (ref 70–99)
Glucose-Capillary: 125 mg/dL — ABNORMAL HIGH (ref 70–99)
Glucose-Capillary: 127 mg/dL — ABNORMAL HIGH (ref 70–99)
Glucose-Capillary: 159 mg/dL — ABNORMAL HIGH (ref 70–99)

## 2023-07-24 MED ORDER — FUROSEMIDE 40 MG PO TABS
40.0000 mg | ORAL_TABLET | Freq: Two times a day (BID) | ORAL | Status: DC
  Administered 2023-07-24 – 2023-07-26 (×4): 40 mg via ORAL
  Filled 2023-07-24 (×5): qty 1

## 2023-07-24 NOTE — Plan of Care (Signed)

## 2023-07-24 NOTE — Progress Notes (Addendum)
 Progress Note   Patient: [name] HQI:[id number] DOB: 05/10/1955 DOA: 07/22/2023     0 DOS: the patient was seen and examined on 07/24/2023   Brief hospital course: [name] is a 68 y.o. female with medical history significant for inheritable hypercoagulable state with positive lupus anticoagulant antibodies, recurrent DVTs, type 2 diabetes mellitus, COPD, chronic diastolic heart failure, who is admitted to [facility] on 07/22/2023 with supratherapeutic INR with acute blood loss anemia after presenting from home to [facility] ED complaining of right lower extremity bruising.   Assessment and Plan: Supra therapeutic INR INR 2.2 today. She got 2.5mg  and 5mg  vit K yesterday. Continue to hold Coumadin .  PT/ OT orders placed. Monitor for bleeding, H/H.  Right lower extremity pain, swelling. High risk for bleeding given high INR. Does have pain but clinically not consistent with compartment syndrome. Continue pain control with tylenol , muscle relaxants, opiates. Ultrasound bilateral lower extremity ordered.  Acute on chronic anemia In the setting of high INR. No evidence of acute blood loss. Hb stable. Right leg swelling, pain noted, if Hb drop significant, will get CT right leg for active bleeding. Continue to hold Coumadin . Iron  profile reviewed.   Type 2 diabetes mellitus- Accucheks, sliding scale as per floor protocol. Continue gabapentin .  Chronic diastolic heart failure- Lasix  40mg  bid resumed. No acute exacerbation. Continue to monitor vitals closely.  COPD: No exacerbation. Continue home inhalers, DuoNebs as needed.  Essential hypertension- BP elevated. Home Antihypertensives resumed.  Hyperlipidemia-continue statin therapy.  Allergic rhinitis-continue home Flonase  and Allegra .  Morbid obesity BMI 46.32 Diet, excise and weight reduction advised.     Out of bed to chair. Incentive spirometry. Nursing supportive care. Fall, aspiration  precautions. Diet:  Diet Orders (From admission, onward)     Start     Ordered   07/24/23 1058  DIET SOFT Room service appropriate? Yes; Fluid consistency: Thin  Diet effective now       Question Answer Comment  Room service appropriate? Yes   Fluid consistency: Thin      07/24/23 1057           DVT prophylaxis: SCDs Start: 07/23/23 0059  Level of care: Med-Surg   Code Status: Full Code  Subjective: Patient is seen and examined today morning.  She is lying comfortably.  Does have bilateral leg pain. Advised out of bed, work with PT.  Physical Exam: Vitals:   07/23/23 1839 07/24/23 0456 07/24/23 0500 07/24/23 0813  BP: (!) 155/85 (!) 177/94    Pulse: 84 88    Resp:  18    Temp:  98 F (36.7 C)    TempSrc:  Oral    SpO2:  (!) 89%  96%  Weight:   133.8 kg   Height:        General - Elderly morbidly obese African-American female, no apparent distress HEENT - PERRLA, EOMI, atraumatic head, non tender sinuses. Lung - Clear, basilar rales, rhonchi, wheezes. Heart - S1, S2 heard, no murmurs, rubs, right greater than left pedal edema. Abdomen - Soft, non tender, obese, bowel sounds good Neuro - Alert, awake and oriented x 3, non focal exam. Skin - Warm and dry.  Right leg swelling, bruise, erythema  Data Reviewed:      Latest Ref Rng & Units 07/24/2023    5:53 AM 07/23/2023    5:08 PM 07/23/2023    9:35 AM  CBC  WBC 4.0 - 10.5 K/uL 9.7     Hemoglobin 12.0 -  15.0 g/dL 16.1  09.6  04.5   Hematocrit 36.0 - 46.0 % 33.9  33.9  34.3   Platelets 150 - 400 K/uL 374         Latest Ref Rng & Units 07/23/2023    5:21 AM 07/22/2023    9:02 PM 07/11/2023   10:50 AM  BMP  Glucose 70 - 99 mg/dL 409  811  914   BUN 8 - 23 mg/dL 15  14  18    Creatinine 0.44 - 1.00 mg/dL 7.82  9.56  2.13   Sodium 135 - 145 mmol/L 141  139  139   Potassium 3.5 - 5.1 mmol/L 4.0  4.0  4.0   Chloride 98 - 111 mmol/L 106  102  102   CO2 22 - 32 mmol/L 24  26  26    Calcium  8.9 - 10.3 mg/dL 7.9  7.7  9.1     DG Chest Portable 1 View Result Date: 07/22/2023 CLINICAL DATA:  Bruising right shoulder and shin. EXAM: PORTABLE CHEST 1 VIEW COMPARISON:  08/23/2022 FINDINGS: The heart size and mediastinal contours are within normal limits. Both lungs are clear. The visualized skeletal structures are unremarkable. IMPRESSION: No active disease. Electronically Signed   By: Janeece Mechanic M.D.   On: 07/22/2023 21:10   DG Tibia/Fibula Right Result Date: 07/22/2023 CLINICAL DATA:  Right shin bruising. EXAM: RIGHT TIBIA AND FIBULA - 2 VIEW COMPARISON:  None Available. FINDINGS: Prior right knee replacement. No visible hardware complicating feature. No acute bony abnormality. Specifically, no fracture, subluxation, or dislocation. Soft tissue swelling in the anterior shin. IMPRESSION: No acute bony abnormality. Electronically Signed   By: Janeece Mechanic M.D.   On: 07/22/2023 21:09   DG Shoulder Right Result Date: 07/22/2023 CLINICAL DATA:  Right shoulder bruising. EXAM: RIGHT SHOULDER - 2+ VIEW COMPARISON:  None Available. FINDINGS: Degenerative changes in the [facility] joint with joint space narrowing and spurring. Glenohumeral joint is maintained. No acute bony abnormality. Specifically, no fracture, subluxation, or dislocation. Soft tissues are intact. IMPRESSION: Degenerative changes in the right AC joint. No acute bony abnormality. Electronically Signed   By: Janeece Mechanic M.D.   On: 07/22/2023 21:08    Family Communication: Discussed with patient, understand and agree. All questions answered.  Disposition: Status is: Inpatient. Inpatient because: Supratherapeutic INR, high risk for bleeding, hemoglobin monitoring, PT evaluation  Planned Discharge Destination: Home with Home Health     Time spent: 39 minutes  Author: Aisha Hove, MD 07/24/2023 12:12 PM Secure chat 7am to 7pm For on call review [email or web address].

## 2023-07-25 ENCOUNTER — Inpatient Hospital Stay (HOSPITAL_COMMUNITY)

## 2023-07-25 DIAGNOSIS — D62 Acute posthemorrhagic anemia: Secondary | ICD-10-CM | POA: Diagnosis not present

## 2023-07-25 DIAGNOSIS — M79661 Pain in right lower leg: Secondary | ICD-10-CM

## 2023-07-25 DIAGNOSIS — R791 Abnormal coagulation profile: Secondary | ICD-10-CM | POA: Diagnosis not present

## 2023-07-25 DIAGNOSIS — E1142 Type 2 diabetes mellitus with diabetic polyneuropathy: Secondary | ICD-10-CM | POA: Diagnosis not present

## 2023-07-25 DIAGNOSIS — M79604 Pain in right leg: Secondary | ICD-10-CM | POA: Diagnosis not present

## 2023-07-25 LAB — CBC
HCT: 32.1 % — ABNORMAL LOW (ref 36.0–46.0)
Hemoglobin: 10.1 g/dL — ABNORMAL LOW (ref 12.0–15.0)
MCH: 26.6 pg (ref 26.0–34.0)
MCHC: 31.5 g/dL (ref 30.0–36.0)
MCV: 84.5 fL (ref 80.0–100.0)
Platelets: 368 10*3/uL (ref 150–400)
RBC: 3.8 MIL/uL — ABNORMAL LOW (ref 3.87–5.11)
RDW: 19.5 % — ABNORMAL HIGH (ref 11.5–15.5)
WBC: 7.5 10*3/uL (ref 4.0–10.5)
nRBC: 0.5 % — ABNORMAL HIGH (ref 0.0–0.2)

## 2023-07-25 LAB — GLUCOSE, CAPILLARY
Glucose-Capillary: 159 mg/dL — ABNORMAL HIGH (ref 70–99)
Glucose-Capillary: 118 mg/dL — ABNORMAL HIGH (ref 70–99)
Glucose-Capillary: 151 mg/dL — ABNORMAL HIGH (ref 70–99)
Glucose-Capillary: 141 mg/dL — ABNORMAL HIGH (ref 70–99)

## 2023-07-25 LAB — PROTIME-INR
INR: 1.7 — ABNORMAL HIGH (ref 0.8–1.2)
Prothrombin Time: 20.5 s — ABNORMAL HIGH (ref 11.4–15.2)

## 2023-07-25 MED ORDER — WARFARIN SODIUM 5 MG PO TABS
10.0000 mg | ORAL_TABLET | Freq: Once | ORAL | Status: AC
  Administered 2023-07-25: 10 mg via ORAL
  Filled 2023-07-25: qty 2

## 2023-07-25 MED ORDER — WARFARIN - PHARMACIST DOSING INPATIENT: Freq: Every day | Status: DC

## 2023-07-25 NOTE — Progress Notes (Signed)
 PHARMACY - ANTICOAGULATION CONSULT NOTE  Pharmacy Consult for warfarin Indication:  recurrent DVTs, inheritable hypercoagulable state  Allergies  Allergen Reactions   Lisinopril  Anaphylaxis, Swelling and Other (See Comments)    THROAT SWELLING  Pt states her throat started to "close up" after being on it for "so long."  THROAT SWELLING, Pt states her throat started to "close up" after being on it for "so long."   Prednisone  Anaphylaxis, Swelling and Other (See Comments)    Throat swelling and tongue irritation   Spironolactone  Other (See Comments)    Caused stroke-like symptoms with the mouth   Corticosteroids Rash    States she developed a rash on her tongue after a steroid injection in her back    Patient Measurements: Height: 5\' 6"  (167.6 cm) Weight: 134 kg (295 lb 6.7 oz) IBW/kg (Calculated) : 59.3 HEPARIN  DW (KG): 90.9  Vital Signs: Temp: 97.7 F (36.5 C) (05/07 1150) Temp Source: Oral (05/07 1150) BP: 147/76 (05/07 1150) Pulse Rate: 74 (05/07 1150)  Labs: Recent Labs    07/22/23 2102 07/23/23 0521 07/23/23 0935 07/23/23 1708 07/24/23 0553 07/25/23 0558 07/25/23 0648  HGB 10.7* 10.5*   < > 10.4* 10.3*  --  10.1*  HCT 33.7* 33.8*   < > 33.9* 33.9*  --  32.1*  PLT 380 356  --   --  374  --  368  LABPROT 56.5* 50.7*  --   --  24.3* 20.5*  --   INR 6.4* 5.6*  --   --  2.2* 1.7*  --   CREATININE 0.84 0.66  --   --   --   --   --   CKTOTAL 201  --   --   --   --   --   --   TROPONINIHS 10  --   --   --   --   --   --    < > = values in this interval not displayed.    Estimated Creatinine Clearance: 94.8 mL/min (by C-G formula based on SCr of 0.66 mg/dL).   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   COPD (chronic obstructive pulmonary disease) (HCC)     inhalers dependent on environment   Depression    Diabetes mellitus    120s usually fasting -  dx more than 10 yrs ago   Dizziness, nonspecific    DVT (deep venous thrombosis) (HCC)    Recurrent   Fibromyalgia    GERD (gastroesophageal reflux disease)    Heart murmur    History of blood transfusion    History of cocaine abuse (HCC)    Remote history    Hyperlipidemia    Hypertension    takes meds daily   Hypothyroidism    Lupus Anticoagulant Positive    On home oxygen  therapy    at night   Shortness of breath    exertion or lying flat   Sleep apnea    2l of oxygen  at night (as of 12/6, she used to)    Medications:  Facility-Administered Medications Prior to Admission  Medication Dose Route Frequency Provider Last Rate Last Admin   sodium chloride  flush (NS) 0.9 % injection 3 mL  3 mL Intravenous Q12H Chandrasekhar, Caretha Chapel, MD  Medications Prior to Admission  Medication Sig Dispense Refill Last Dose/Taking   ACCU-CHEK GUIDE TEST test strip USE TO TEST BLOOD SUGAR UP TO FOUR TIMES A DAY AS DIRECTED 100 strip 10 Past Week   Accu-Chek Softclix Lancets lancets USE TO TEST BLOOD SUGAR UP TO FOUR TIMES A DAY AS DIRECTED 100 each 10 Past Week   albuterol  (PROVENTIL ) (2.5 MG/3ML) 0.083% nebulizer solution USE 1 VIAL IN NEBULIZER EVERY 6 HOURS - and as needed 3 mL 11 Unknown   albuterol  (VENTOLIN  HFA) 108 (90 Base) MCG/ACT inhaler INHALE TWO (2) PUFFS BY MOUTH EVERY 4 HOURS AS NEEDED FOR SHORTNESS OF BREATH 8.5 g 10 Unknown   Blood Glucose Monitoring Suppl (ACCU-CHEK GUIDE) w/Device KIT USE TO TEST BLOOD SUGAR 3 TIMES DAILY 1 kit 11 Past Week   Blood Glucose Monitoring Suppl DEVI 1 each by Does not apply route in the morning, at noon, and at bedtime. May substitute to any manufacturer covered by patient's insurance. 1 each 0 Past Week   canagliflozin  (INVOKANA ) 100 MG TABS tablet Take 1 tablet (100 mg total) by mouth daily before breakfast. 30 tablet 11 07/21/2023   cloNIDine   (CATAPRES ) 0.1 MG tablet TAKE ONE (1) TABLET BY MOUTH TWICE DAILY BEFORE BREAKFAST AND AT BEDTIME 60 tablet 10 07/21/2023   diclofenac sodium (VOLTAREN) 1 % GEL Apply 2 g topically daily as needed (fibromyalgia pain- affected sites).   Unknown   diltiazem  (CARDIZEM  CD) 240 MG 24 hr capsule TAKE 1 CAPSULE BY MOUTH DAILY BEFORE BREAKFAST *REFILL REQUEST* 30 capsule 10 07/21/2023   DULoxetine  (CYMBALTA ) 30 MG capsule Take 90 mg by mouth at bedtime.   07/21/2023   FEROSUL 325 (65 Fe) MG tablet TAKE ONE TABLET BY MOUTH ONCE DAILY 90 tablet 0 07/21/2023   fexofenadine  (ALLEGRA ) 180 MG tablet Take 1 tablet (180 mg total) by mouth daily. 90 tablet 3 07/21/2023   fluticasone  (FLONASE ) 50 MCG/ACT nasal spray INSTILL 2 SPRAYS IN EACH NOSTRIL TWICE DAILY 202 g 11 Past Week   furosemide  (LASIX ) 80 MG tablet TAKE ONE (1) TABLET BY MOUTH TWICE DAILY 180 tablet 2 07/21/2023   gabapentin  (NEURONTIN ) 300 MG capsule TAKE ONE (1) CAPSULE BY MOUTH TWICE DAILY 60 capsule 10 07/21/2023   HYDROcodone -acetaminophen  (NORCO) 5-325 MG tablet Take 1 tablet by mouth every 6 (six) hours as needed for moderate pain (pain score 4-6). 20 tablet 0 Unknown   Incontinence Supply Disposable (PROCARE BARIATRIC BRIEFS) MISC Use as needed. 96 each 11 Unknown   lidocaine  (LIDODERM ) 5 % APPLY 1 PATCH TOPICALLY ON THE SKIN DAILY AS NEEDED TO PAINFUL SITE *12 HOURS ON AND 12 HOURS OFF OR AS DIRECTED BY MD* 30 patch 11 Unknown   LINZESS  145 MCG CAPS capsule TAKE 1 CAPSULE BY MOUTH EVERY OTHER DAY 30 capsule 10 07/21/2023   magic mouthwash (multi-ingredient) oral suspension Swish and spit 5 mLs 4 (four) times daily as needed for mouth pain. 200 mL 0 Unknown   metFORMIN  (GLUCOPHAGE ) 500 MG tablet TAKE 1 TABLET BY MOUTH EVERY DAY AT BEDTIME *REFILL REQUEST* 30 tablet 10 07/21/2023   methocarbamol  (ROBAXIN ) 500 MG tablet Take 1 tablet (500 mg total) by mouth 2 (two) times daily. (Patient taking differently: Take 500 mg by mouth 2 (two) times daily as needed for muscle  spasms.) 20 tablet 0 Unknown   omeprazole  (PRILOSEC) 20 MG capsule Take 1 capsule (20 mg total) by mouth 2 (two) times daily before a meal. 180 capsule 1 07/22/2023 Morning   potassium chloride  (MICRO-K )  10 MEQ CR capsule TAKE TWO (2) CAPSULES BY MOUTH EVERY DAY AT BEDTIME 60 capsule 10 07/21/2023   QUEtiapine  (SEROQUEL ) 100 MG tablet Take 150 mg by mouth at bedtime.   07/21/2023   rosuvastatin  (CRESTOR ) 10 MG tablet Take 1 tablet (10 mg total) by mouth daily. 90 tablet 3 07/21/2023   Semaglutide , 2 MG/DOSE, (OZEMPIC , 2 MG/DOSE,) 8 MG/3ML SOPN Inject 2 mg into the skin once a week. 9 mL 3 07/16/2023   SYMBICORT  160-4.5 MCG/ACT inhaler INHALE TWO (2) PUFFS BY MOUTH TWICE DAILY 10.2 g 10 Past Week   traMADol  (ULTRAM ) 50 MG tablet Take 1 tablet (50 mg total) by mouth 3 (three) times daily as needed. 30 tablet 0 Unknown   warfarin (COUMADIN ) 10 MG tablet Take daily as directed by MD 30 tablet 5 07/20/2023   warfarin (COUMADIN ) 7.5 MG tablet TAKE 1 TABLET BY MOUTH DAILY 18 tablet 5 07/21/2023   zonisamide  (ZONEGRAN ) 100 MG capsule TAKE 1 CAPSULE BY MOUTH EACH MORNING AND TAKE 2 CAPSULES IN THE EVENING 90 capsule 10 07/21/2023   Glucose Blood (BLOOD GLUCOSE TEST STRIPS) STRP 1 each by In Vitro route in the morning, at noon, and at bedtime. May substitute to any manufacturer covered by patient's insurance. 100 strip 3    Lancets Misc. MISC May substitute to any manufacturer covered by patient's insurance. 100 each 3    QUEtiapine  (SEROQUEL ) 50 MG tablet Take 50 mg by mouth in the morning and at bedtime. (Patient not taking: Reported on 07/23/2023)   Not Taking   spironolactone  (ALDACTONE ) 25 MG tablet TAKE 1/2 TABLET BY MOUTH ONCE DAILY (Patient not taking: Reported on 07/23/2023) 45 tablet 3 Not Taking   Vitamin D , Ergocalciferol , (DRISDOL ) 1.25 MG (50000 UNIT) CAPS capsule Take 1 capsule (50,000 Units total) by mouth every 7 (seven) days. (Patient not taking: Reported on 07/23/2023) 12 capsule 0 Not Taking   Scheduled:    cloNIDine   0.2 mg Oral BID   diltiazem   240 mg Oral Daily   DULoxetine   90 mg Oral QHS   ferrous sulfate   325 mg Oral Daily   fluticasone   2 spray Each Nare BID   fluticasone  furoate-vilanterol  1 puff Inhalation Daily   furosemide   40 mg Oral BID   gabapentin   300 mg Oral BID   insulin  aspart  0-9 Units Subcutaneous TID WC   loratadine   10 mg Oral Daily   magic mouthwash w/lidocaine   5 mL Oral QID   pantoprazole   40 mg Oral Daily   rosuvastatin   10 mg Oral Daily   zonisamide   100 mg Oral BID    Assessment: 67 YO female presenting to the ED 5/4 with an elevated INR of 6.4 and R lower extremity bruising. Patient was initially given 2.5mg  of PO VitK and INR decreased to 5.6. She was given another dose of 5mg  PO VitK and INR subsequently decreased to 2.2. Pharmacy has been consulted to resume warfarin therapy.  Prior warfarin regimen (confirmed with patient): 10mg  daily on MWF and 7.5mg  daily on all other days.  Today, 07/25/23: Hgb 10.1, plts 368--stable INR 1.7--low No s/sx of bleeding reported  5/7 doppler negative No DDIs noted  [place] of Therapy:  INR 2-3 Monitor platelets by anticoagulation protocol: Yes   Plan:  Give warfarin 10mg  PO x1 tonight  Monitor INR daily  Continue to monitor for s/sx of bleeding    Roselee Cong, PharmD Clinical Pharmacist  5/7/20254:28 PM

## 2023-07-25 NOTE — Plan of Care (Signed)

## 2023-07-25 NOTE — Progress Notes (Signed)
 Lower extremity venous duplex completed. Please see CV Procedures for preliminary results.  Estanislao Heimlich, RVT 07/25/23 4:29 PM

## 2023-07-25 NOTE — Progress Notes (Signed)
 Progress Note   Patient: [name] ZOX:[id number] DOB: [birth year] DOA: 07/22/2023     1 DOS: the patient was seen and examined on 07/25/2023   Brief hospital course: [name] is a 68 y.o. 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   Assessment and Plan: Supra therapeutic INR INR >5 upon presentation. She got 2.5mg  and 5mg  vit K during this admission. Today INR 1.7. Coumadin  will be resumed. PT/ OT orders placed. Monitor for bleeding, H/H.  Right lower extremity pain, swelling. High risk for bleeding given high INR. Does have pain but clinically not consistent with compartment syndrome. Continue pain control with tylenol , muscle relaxants, opiates. Ultrasound bilateral lower extremity ordered.  Acute on chronic anemia- In the setting of high INR. No evidence of acute blood loss. Hb stable. Resume Coumadin . Continue iron  supplements.  Type 2 diabetes mellitus- Accucheks, sliding scale as per floor protocol. Continue gabapentin .  Chronic diastolic heart failure- Lasix  40mg  bid resumed. No acute exacerbation. Continue to monitor vitals closely.  COPD: No exacerbation. Continue home inhalers, DuoNebs as needed.  Essential hypertension- BP stable. Home Cardizem , Clonidine  resumed.  Hyperlipidemia-continue statin therapy.  Allergic rhinitis-continue home Flonase  and Allegra .  Morbid obesity BMI 46.32 Diet, excise and weight reduction advised.     Out of bed to chair. Incentive spirometry. Nursing supportive care. Fall, aspiration precautions. Diet:  Diet Orders (From admission, onward)     Start      Ordered   07/24/23 1058  DIET SOFT Room service appropriate? Yes; Fluid consistency: Thin  Diet effective now       Question Answer Comment  Room service appropriate? Yes   Fluid consistency: Thin      07/24/23 1057           DVT prophylaxis: SCDs Start: 07/23/23 0059  Level of care: Med-Surg   Code Status: Full Code  Subjective: Patient is seen and examined today morning.  She is sitting in chair comfortably. Bilateral leg pain persist, but better. Encouraged to work with PT.  Physical Exam: Vitals:   07/24/23 2011 07/25/23 0459 07/25/23 0500 07/25/23 1150  BP: (!) 157/82 (!) 160/92  (!) 147/76  Pulse: 81 80  74  Resp: 20 16  18   Temp: 98.4 F (36.9 C) (!) 97.5 F (36.4 C)  97.7 F (36.5 C)  TempSrc: Oral Oral  Oral  SpO2: 98% 98%  97%  Weight:   134 kg   Height:        General - Elderly morbidly obese African-American female, no apparent distress HEENT - PERRLA, EOMI, atraumatic head, non tender sinuses. Lung - Clear, basilar rales, rhonchi, wheezes. Heart - S1, S2 heard, no murmurs, rubs, right greater than left pedal edema. Abdomen - Soft, non tender, obese, bowel sounds good Neuro - Alert, awake and oriented x 3, non focal exam. Skin - Warm and dry.  Right leg swelling, bruise, erythema  Data Reviewed:      Latest Ref Rng & Units 07/25/2023    6:48 AM 07/24/2023    5:53 AM 07/23/2023    5:08 PM  CBC  WBC 4.0 - 10.5 K/uL 7.5  9.7    Hemoglobin 12.0 - 15.0 g/dL 81.1  91.4  10.4   Hematocrit 36.0 - 46.0 % 32.1  33.9  33.9   Platelets 150 - 400 K/uL 368  374        Latest Ref Rng & Units 07/23/2023    5:21 AM 07/22/2023    9:02 PM 07/11/2023   10:50 AM  BMP  Glucose 70 - 99 mg/dL 161  096  045   BUN 8 - 23 mg/dL 15  14  18    Creatinine 0.44 - 1.00 mg/dL 4.09  8.11  9.14   Sodium 135 - 145 mmol/L 141  139  139   Potassium 3.5 - 5.1 mmol/L 4.0  4.0  4.0   Chloride 98 - 111 mmol/L 106  102  102   CO2 22 - 32 mmol/L 24  26  26    Calcium  8.9 - 10.3 mg/dL 7.9   7.7  9.1    No results found.   Family Communication: Discussed with patient, understand and agree. All questions answered.  Disposition: Status is: Inpatient. Inpatient because:hemoglobin monitoring, PT evaluation  Planned Discharge Destination: Home with Home Health     Time spent: 38 minutes  Author: Aisha Hove, MD 07/25/2023 4:10 PM Secure chat 7am to 7pm For on call review [email or web address].

## 2023-07-26 ENCOUNTER — Ambulatory Visit: Admitting: Orthopedic Surgery

## 2023-07-26 ENCOUNTER — Encounter (HOSPITAL_COMMUNITY): Payer: Self-pay | Admitting: Internal Medicine

## 2023-07-26 DIAGNOSIS — E1142 Type 2 diabetes mellitus with diabetic polyneuropathy: Secondary | ICD-10-CM | POA: Diagnosis not present

## 2023-07-26 DIAGNOSIS — D649 Anemia, unspecified: Secondary | ICD-10-CM

## 2023-07-26 DIAGNOSIS — R791 Abnormal coagulation profile: Secondary | ICD-10-CM | POA: Diagnosis not present

## 2023-07-26 DIAGNOSIS — M79604 Pain in right leg: Secondary | ICD-10-CM | POA: Diagnosis not present

## 2023-07-26 LAB — CBC
HCT: 33.6 % — ABNORMAL LOW (ref 36.0–46.0)
Hemoglobin: 10.3 g/dL — ABNORMAL LOW (ref 12.0–15.0)
MCH: 26.4 pg (ref 26.0–34.0)
MCHC: 30.7 g/dL (ref 30.0–36.0)
MCV: 86.2 fL (ref 80.0–100.0)
Platelets: 371 10*3/uL (ref 150–400)
RBC: 3.9 MIL/uL (ref 3.87–5.11)
RDW: 19.9 % — ABNORMAL HIGH (ref 11.5–15.5)
WBC: 6.7 10*3/uL (ref 4.0–10.5)
nRBC: 0.6 % — ABNORMAL HIGH (ref 0.0–0.2)

## 2023-07-26 LAB — GLUCOSE, CAPILLARY
Glucose-Capillary: 121 mg/dL — ABNORMAL HIGH (ref 70–99)
Glucose-Capillary: 93 mg/dL (ref 70–99)

## 2023-07-26 LAB — PROTIME-INR
INR: 1.6 — ABNORMAL HIGH (ref 0.8–1.2)
Prothrombin Time: 19.6 s — ABNORMAL HIGH (ref 11.4–15.2)

## 2023-07-26 NOTE — Evaluation (Signed)
 Occupational Therapy Evaluation Patient Details Name: [name] MRN: [id number] DOB: Aug 06, 1955 Today's Date: 07/26/2023   History of Present Illness   [name] is a 68 yr old female admitted to the hospital with R LE pain and bruising and was found to have supratherapeutic INR and acute on chronic anemia. 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 As such, her ADL performance is compromised and she requires assist for tasks, including lower body dressing, sit to stand, and ambulating using a RW. She was also noted to be with slight deconditioning, bilateral knee discomfort from prior surgeries, and some fatigue with progressive activity. She reported baseline difficulty with performing lower body dressing, therefore would benefit from further instruction on using AE to assist as pertains to this. She will benefit from further OT services to maximize her independence with self care tasks. Home health OT is recommended.      If plan is discharge home, recommend the following:   A little help with bathing/dressing/bathroom;Assistance with cooking/housework;Assist for transportation     Functional Status Assessment   Patient has had a recent decline in their functional status and demonstrates the ability to make significant improvements in function in a reasonable and predictable amount of time.     Equipment Recommendations   Other (comment) (reacher, sock aid, long-handled sponge, shoe horn)     Recommendations for Other Services         Precautions/Restrictions   Precautions Precautions: Fall Restrictions Weight Bearing Restrictions Per Provider Order: No     Mobility Bed Mobility Overal bed mobility: Needs  Assistance Bed Mobility: Supine to Sit     Supine to sit: Contact guard, HOB elevated          Transfers Overall transfer level: Needs assistance Equipment used: Rolling walker (2 wheels) Transfers: Sit to/from Stand Sit to Stand: Contact guard assist                  Balance       Sitting balance - Comments: static sitting-good. dynamic sitting-fair+       Standing balance comment: CGA with RW           ADL either performed or assessed with clinical judgement   ADL Overall ADL's : Needs assistance/impaired Eating/Feeding: Independent;Sitting   Grooming: Set up;Sitting           Upper Body Dressing : Set up;Sitting   Lower Body Dressing: Moderate assistance Lower Body Dressing Details (indicate cue type and reason): Pt presented with difficulty donning and doffing her socks seated EOB. She stated her aide normally assists in this regard. OT educated her on use of a reacher to doff socks and sock aid to donn socks. Toilet Transfer: Contact guard assist;Ambulation;Rolling walker (2 wheels);Grab bars                                Pertinent Vitals/Pain Pain Assessment Pain Assessment: No/denies pain     Extremity/Trunk Assessment Upper Extremity Assessment Upper Extremity Assessment: Overall WFL for tasks assessed;Left hand dominant   Lower Extremity Assessment Lower Extremity Assessment: Generalized weakness       Communication Communication Communication: No apparent difficulties   Cognition Arousal: Alert Behavior During Therapy: [facility] for tasks  assessed/performed          Following commands: Intact                  Home Living Family/patient expects to be discharged to:: Private residence Living Arrangements: Alone Available Help at Discharge: Personal care attendant Type of Home: Apartment Home Access: Level entry;Elevator     Home Layout: One level     Bathroom Shower/Tub: Tub/shower unit         Home  Equipment: Educational psychologist (4 wheels);BSC/3in1;Cane - single point   Additional Comments: Pt reports having a PCA Monday-Friday 12:15-3:15pm, Saturday 8-10am, Sunday 8-9:30am who assists with bathing & cleaning.      Prior Functioning/Environment Prior Level of Function : Needs assist             Mobility Comments:  (Use of a rollator for ambulation) ADLs Comments:  (Home health aide performed cleaning and cleaning, as well as assisted the pt with bathing and donning/doffing socks and shoes. She does not drive.)    OT Problem List: Decreased strength;Impaired balance (sitting and/or standing);Decreased knowledge of use of DME or AE;Decreased activity tolerance   OT Treatment/Interventions: Self-care/ADL training;Therapeutic exercise;Balance training;Energy conservation;DME and/or AE instruction;Therapeutic activities      OT Goals(Current goals can be found in the care plan section)   Acute Rehab OT Goals OT Carre Formulation: With patient Time For [place] Achievement: 08/09/23 Potential to Achieve Goals: Good ADL Goals Pt Will Perform Grooming: with supervision;standing Pt Will Perform Lower Body Dressing: with supervision;sit to/from stand;sitting/lateral leans;with adaptive equipment Pt Will Transfer to Toilet: with supervision;ambulating;grab bars Pt Will Perform Toileting - Clothing Manipulation and hygiene: with supervision;sit to/from stand   OT Frequency:  Min 2X/week    Co-evaluation PT/OT/SLP Co-Evaluation/Treatment: Yes Reason for Co-Treatment: To address functional/ADL transfers PT goals addressed during session: Mobility/safety with mobility OT goals addressed during session: ADL's and self-care      AM-PAC OT "6 Clicks" Daily Activity     Outcome Measure Help from another person eating meals?: None Help from another person taking care of personal grooming?: A Little Help from another person toileting, which includes using toliet, bedpan, or urinal?: A  Little Help from another person bathing (including washing, rinsing, drying)?: A Lot Help from another person to put on and taking off regular upper body clothing?: A Little Help from another person to put on and taking off regular lower body clothing?: A Lot 6 Click Score: 17   End of Session Equipment Utilized During Treatment: Gait belt;Rolling walker (2 wheels) Nurse Communication: Mobility status  Activity Tolerance: Patient tolerated treatment well Patient left: in chair;with call bell/phone within reach;with chair alarm set  OT Visit Diagnosis: Unsteadiness on feet (R26.81);Muscle weakness (generalized) (M62.81)                Time: 1610-9604 OT Time Calculation (min): 18 min Charges:  OT General Charges $OT Visit: 1 Visit OT Evaluation $OT Eval Moderate Complexity: 1 Mod    Zakariya Knickerbocker L Halona Amstutz, OTR/L 07/26/2023, 1:05 PM

## 2023-07-26 NOTE — Progress Notes (Signed)
 PT states she does not wish to use PAP therapy while here at hospital ([facility]).

## 2023-07-26 NOTE — TOC Transition Note (Signed)
 Transition of Care [facility]) - Discharge Note   Patient Details  Name: [name] MRN: [id number] Date of Birth: 1955-12-25  Transition of [facility]) CM/SW Contact:  Tessie Fila, RN Phone Number: 07/26/2023, 12:38 PM   Clinical Narrative:    Pt discharging home. Pt has HHPT/OT set up with [facility]. Information added to AVS. Pt transported home by family/friend. There are no other needs at this time.   Final next level of care: Home w Home Health Services Barriers to Discharge: No Barriers Identified   Patient Goals and CMS Choice Patient states their goals for this hospitalization and ongoing recovery are:: To return home CMS Medicare.gov Compare Post Acute Care list provided to:: Other (Comment Required) (NA) Choice offered to / list presented to : NA [place] ownership interest in [facility]provided to:: Parent NA    Discharge Placement                       Discharge Plan and Services Additional resources added to the After Visit Summary for                  DME Arranged: N/A DME Agency: NA       HH Arranged: PT, OT HH Agency: Other - See comment [facility]) Date HH Agency Contacted: 07/26/23 Time HH Agency Contacted: 1203 Representative spoke with at [facility] Agency: [place] [phone]  Social Drivers of Health (SDOH) Interventions SDOH Screenings   Food Insecurity: No Food Insecurity (07/23/2023)  Housing: Low Risk  (07/23/2023)  Transportation Needs: No Transportation Needs (07/23/2023)  Utilities: Not At Risk (07/23/2023)  Alcohol Screen: Low Risk  (03/07/2023)  Depression (PHQ2-9): Medium Risk (07/11/2023)  Financial Resource Strain: Low Risk  (03/07/2023)  Physical Activity: Insufficiently Active (03/07/2023)  Social Connections: Moderately Integrated (07/22/2023)  Stress: No Stress Concern Present (03/07/2023)  Tobacco Use: Low Risk  (07/26/2023)  Health Literacy: Adequate Health Literacy (03/07/2023)     Readmission Risk  Interventions    09/01/2022   11:01 AM  Readmission Risk Prevention Plan  Transportation Screening Complete  Medication Review (RN Care Manager) Complete  PCP or Specialist appointment within 3-5 days of discharge Complete  HRI or Home Care Consult Complete  SW Recovery Care/Counseling Consult Complete  Palliative Care Screening Not Applicable  Skilled Nursing Facility Complete

## 2023-07-26 NOTE — Progress Notes (Signed)
 AVS reviewed w/ pt who verbalized an understanding. No other questions at this time. Pt 's PIV removed as noted. Pt dressed for d/c to home. To lobby via w/c - home w/ family

## 2023-07-26 NOTE — Discharge Summary (Signed)
 Physician Discharge Summary   Patient: Jacquelyne Cun Satcher MRN: [id number] DOB: 01-Aug-1955  Admit date:     07/22/2023  Discharge date: 07/26/23  Discharge Physician: Aisha Hove   PCP: Viola Greulich, MD   Recommendations at discharge:    PCP follow up in 3 days with repeat INR. Heme/ onc follow up as scheduled.  Discharge Diagnoses: Principal Problem:   Supratherapeutic INR Active Problems:   Essential hypertension   DM2 (diabetes mellitus, type 2) (HCC)   Acute pain of right lower extremity   Chronic diastolic CHF (congestive heart failure) (HCC)   Allergic rhinitis   HLD (hyperlipidemia)   Acute blood loss anemia   History of COPD  Resolved Problems:   * No resolved hospital problems. [facility] Course: Deysy Furmanski Carthen is a 68 y.o. 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   Assessment and Plan: Supra therapeutic INR INR >5 upon presentation. She got 2.5mg  and 5mg  vit K during this admission. INR 1.7. Coumadin  home dose resumed. Her Hb remained stable. Advised to follow PCP, heme/ onc upon discharge with repeat INR on Monday.   Right lower extremity pain, swelling. Continue pain control with tylenol , muscle relaxants, opiates. Ultrasound bilateral lower extremity negative for DVT. Given his hb low no more imaging studies needed. PT/ OT advised HH PT/ OT.   Acute on chronic anemia- In the setting of high INR. No evidence of acute blood loss. Hb stable. Resumed Coumadin . Continue iron  supplements.   Type 2 diabetes mellitus- Home medications to be resumed upon discharge. Continue gabapentin .    Chronic diastolic heart failure- Lasix  40mg  bid resumed. No acute exacerbation. Continue to monitor vitals closely.   COPD: No exacerbation. Continue home inhalers, DuoNebs as needed.   Essential hypertension- BP stable. Home Cardizem , Clonidine  resumed.   Hyperlipidemia-continue statin therapy.   Allergic rhinitis-continue home Flonase  and Allegra .   Morbid obesity BMI 46.32 Diet, excise and weight reduction advised.      Consultants: none Procedures performed: none  Disposition: Home health Diet recommendation:  Discharge Diet Orders (From admission, onward)     Start     Ordered   07/26/23 0000  Diet - low sodium heart healthy        07/26/23 1100   07/26/23 0000  Diet Carb Modified        07/26/23 1100           Cardiac and Carb modified diet DISCHARGE MEDICATION: Allergies as of 07/26/2023       Reactions   Lisinopril  Anaphylaxis, Swelling, Other (See Comments)   THROAT SWELLING Pt states her throat started to "close up" after being on it for "so long." THROAT SWELLING, Pt states her throat started to "close up" after being on it for "so long."   Prednisone  Anaphylaxis, Swelling, Other (See Comments)   Throat swelling and tongue irritation   Spironolactone  Other (See Comments)   Caused stroke-like symptoms with the mouth   Corticosteroids Rash   States she developed a rash on her tongue after a steroid injection in her back        Medication List     STOP taking these medications    HYDROcodone -acetaminophen  5-325 MG tablet Commonly known  as: Norco   traMADol  50 MG tablet Commonly known as: ULTRAM        TAKE these medications    Accu-Chek Guide Test test strip Generic drug: glucose blood USE TO TEST BLOOD SUGAR UP TO FOUR TIMES A DAY AS DIRECTED   BLOOD GLUCOSE TEST STRIPS Strp 1 each by In Vitro route in the morning, at noon, and at bedtime. May substitute to any manufacturer covered by patient's insurance.   Accu-Chek Softclix  Lancets lancets USE TO TEST BLOOD SUGAR UP TO FOUR TIMES A DAY AS DIRECTED   albuterol  108 (90 Base) MCG/ACT inhaler Commonly known as: VENTOLIN  HFA INHALE TWO (2) PUFFS BY MOUTH EVERY 4 HOURS AS NEEDED FOR SHORTNESS OF BREATH   albuterol  (2.5 MG/3ML) 0.083% nebulizer solution Commonly known as: PROVENTIL  USE 1 VIAL IN NEBULIZER EVERY 6 HOURS - and as needed   Blood Glucose Monitoring Suppl Devi 1 each by Does not apply route in the morning, at noon, and at bedtime. May substitute to any manufacturer covered by patient's insurance.   Accu-Chek Guide w/Device Kit USE TO TEST BLOOD SUGAR 3 TIMES DAILY   canagliflozin  100 MG Tabs tablet Commonly known as: Invokana  Take 1 tablet (100 mg total) by mouth daily before breakfast.   cloNIDine  0.1 MG tablet Commonly known as: CATAPRES  TAKE ONE (1) TABLET BY MOUTH TWICE DAILY BEFORE BREAKFAST AND AT BEDTIME   diclofenac sodium 1 % Gel Commonly known as: VOLTAREN Apply 2 g topically daily as needed (fibromyalgia pain- affected sites).   diltiazem  240 MG 24 hr capsule Commonly known as: CARDIZEM  CD TAKE 1 CAPSULE BY MOUTH DAILY BEFORE BREAKFAST *REFILL REQUEST*   DULoxetine  30 MG capsule Commonly known as: CYMBALTA  Take 90 mg by mouth at bedtime.   FeroSul 325 (65 Fe) MG tablet Generic drug: ferrous sulfate  TAKE ONE TABLET BY MOUTH ONCE DAILY   fexofenadine  180 MG tablet Commonly known as: ALLEGRA  Take 1 tablet (180 mg total) by mouth daily.   fluticasone  50 MCG/ACT nasal spray Commonly known as: FLONASE  INSTILL 2 SPRAYS IN EACH NOSTRIL TWICE DAILY   furosemide  80 MG tablet Commonly known as: LASIX  TAKE ONE (1) TABLET BY MOUTH TWICE DAILY   gabapentin  300 MG capsule Commonly known as: NEURONTIN  TAKE ONE (1) CAPSULE BY MOUTH TWICE DAILY   Lancets Misc. Misc May substitute to any manufacturer covered by patient's insurance.   lidocaine  5 % Commonly known as: LIDODERM  APPLY 1 PATCH TOPICALLY ON THE SKIN DAILY AS NEEDED  TO PAINFUL SITE *12 HOURS ON AND 12 HOURS OFF OR AS DIRECTED BY MD*   Linzess  145 MCG Caps capsule Generic drug: linaclotide  TAKE 1 CAPSULE BY MOUTH EVERY OTHER DAY   magic mouthwash (multi-ingredient) oral suspension Swish and spit 5 mLs 4 (four) times daily as needed for mouth pain.   metFORMIN  500 MG tablet Commonly known as: GLUCOPHAGE  TAKE 1 TABLET BY MOUTH EVERY DAY AT BEDTIME *REFILL REQUEST*   methocarbamol  500 MG tablet Commonly known as: ROBAXIN  Take 1 tablet (500 mg total) by mouth 2 (two) times daily. What changed:  when to take this reasons to take this   omeprazole  20 MG capsule Commonly known as: PRILOSEC Take 1 capsule (20 mg total) by mouth 2 (two) times daily before a meal.   Ozempic  (2 MG/DOSE) 8 MG/3ML Sopn Generic drug: Semaglutide  (2 MG/DOSE) Inject 2 mg into the skin once a week.   potassium chloride  10 MEQ CR capsule Commonly known as: MICRO-K  TAKE TWO (2) CAPSULES BY MOUTH  EVERY DAY AT BEDTIME   PROCare Bariatric Briefs Misc Use as needed.   QUEtiapine  50 MG tablet Commonly known as: SEROQUEL  Take 50 mg by mouth in the morning and at bedtime.   QUEtiapine  100 MG tablet Commonly known as: SEROQUEL  Take 150 mg by mouth at bedtime.   rosuvastatin  10 MG tablet Commonly known as: CRESTOR  Take 1 tablet (10 mg total) by mouth daily.   spironolactone  25 MG tablet Commonly known as: ALDACTONE  TAKE 1/2 TABLET BY MOUTH ONCE DAILY   Symbicort  160-4.5 MCG/ACT inhaler Generic drug: budesonide -formoterol  INHALE TWO (2) PUFFS BY MOUTH TWICE DAILY   Vitamin D  (Ergocalciferol ) 1.25 MG (50000 UNIT) Caps capsule Commonly known as: DRISDOL  Take 1 capsule (50,000 Units total) by mouth every 7 (seven) days.   warfarin 10 MG tablet Commonly known as: COUMADIN  Take as directed. If you are unsure how to take this medication, talk to your nurse or doctor. Original instructions: Take daily as directed by MD   warfarin 7.5 MG tablet Commonly known as:  COUMADIN  Take as directed. If you are unsure how to take this medication, talk to your nurse or doctor. Original instructions: TAKE 1 TABLET BY MOUTH DAILY   zonisamide  100 MG capsule Commonly known as: ZONEGRAN  TAKE 1 CAPSULE BY MOUTH EACH MORNING AND TAKE 2 CAPSULES IN THE EVENING        Discharge Exam: Filed Weights   07/22/23 2132 07/24/23 0500 07/25/23 0500  Weight: 130.2 kg 133.8 kg 134 kg      07/26/2023    5:36 AM 07/25/2023    8:32 PM 07/25/2023   11:50 AM  Vitals with BMI  Systolic 147 149 409  Diastolic 97 73 76  Pulse 80 83 74    General - Elderly morbidly obese African-American female, no apparent distress HEENT - PERRLA, EOMI, atraumatic head, non tender sinuses. Lung - Clear, basilar rales, rhonchi, wheezes. Heart - S1, S2 heard, no murmurs, rubs, right greater than left pedal edema. Abdomen - Soft, non tender, obese, bowel sounds good Neuro - Alert, awake and oriented x 3, non focal exam. Skin - Warm and dry.  Right leg swelling, bruise, erythema  Condition at discharge: stable  The results of significant diagnostics from this hospitalization (including imaging, microbiology, ancillary and laboratory) are listed below for reference.   Imaging Studies: VAS US  LOWER EXTREMITY VENOUS (DVT) Result Date: 07/25/2023  Lower Venous DVT Study Patient Name:  JUDA DEFREITAS Marsan  Date of Exam:   07/25/2023 Medical Rec #: [id number]            Accession #:    [id number] Date of Birth: 1956-02-14            Patient Gender: F Patient Age:   68 years Exam Location:  [facility] Procedure:      VAS US  LOWER EXTREMITY VENOUS (DVT) Referring Phys: Aisha Hove --------------------------------------------------------------------------------  Indications: Pain.  Risk Factors: DVT Hx obesity. Limitations: Body habitus. Comparison Study: No significant changes seen since previous exam 04/25/21. Performing Technologist: Estanislao Heimlich  Examination Guidelines: A complete  evaluation includes B-mode imaging, spectral Doppler, color Doppler, and power Doppler as needed of all accessible portions of each vessel. Bilateral testing is considered an integral part of a complete examination. Limited examinations for reoccurring indications may be performed as noted. The reflux portion of the exam is performed with the patient in reverse Trendelenburg.  +---------+---------------+---------+-----------+----------+--------------+ RIGHT    CompressibilityPhasicitySpontaneityPropertiesThrombus Aging +---------+---------------+---------+-----------+----------+--------------+ CFV      Full  Yes      Yes                                 +---------+---------------+---------+-----------+----------+--------------+ SFJ      Full                                                        +---------+---------------+---------+-----------+----------+--------------+ FV Prox  Full                                                        +---------+---------------+---------+-----------+----------+--------------+ FV Mid   Full                                                        +---------+---------------+---------+-----------+----------+--------------+ FV DistalFull                                                        +---------+---------------+---------+-----------+----------+--------------+ PFV      Full                                                        +---------+---------------+---------+-----------+----------+--------------+ POP      Full           Yes      Yes                                 +---------+---------------+---------+-----------+----------+--------------+ PTV      Full                    Yes                                 +---------+---------------+---------+-----------+----------+--------------+ PERO     Full                    Yes                                  +---------+---------------+---------+-----------+----------+--------------+   +---------+---------------+---------+-----------+----------+--------------+ LEFT     CompressibilityPhasicitySpontaneityPropertiesThrombus Aging +---------+---------------+---------+-----------+----------+--------------+ CFV      Full           Yes      Yes                                 +---------+---------------+---------+-----------+----------+--------------+ SFJ      Full                                                        +---------+---------------+---------+-----------+----------+--------------+  FV Prox  Full                                                        +---------+---------------+---------+-----------+----------+--------------+ FV Mid   Full                                                        +---------+---------------+---------+-----------+----------+--------------+ FV DistalFull                                                        +---------+---------------+---------+-----------+----------+--------------+ PFV      Full                                                        +---------+---------------+---------+-----------+----------+--------------+ POP      Full           Yes      Yes                                 +---------+---------------+---------+-----------+----------+--------------+ PTV      Full                    Yes                                 +---------+---------------+---------+-----------+----------+--------------+ PERO     Full                    Yes                                 +---------+---------------+---------+-----------+----------+--------------+     Summary: BILATERAL: - No evidence of deep vein thrombosis seen in the lower extremities, bilaterally. -No evidence of popliteal cyst, bilaterally.   *See table(s) above for measurements and observations. Electronically signed by Genny Kid MD on 07/25/2023 at 5:30:40  PM.    Final    DG Chest Portable 1 View Result Date: 07/22/2023 CLINICAL DATA:  Bruising right shoulder and shin. EXAM: PORTABLE CHEST 1 VIEW COMPARISON:  08/23/2022 FINDINGS: The heart size and mediastinal contours are within normal limits. Both lungs are clear. The visualized skeletal structures are unremarkable. IMPRESSION: No active disease. Electronically Signed   By: Janeece Mechanic M.D.   On: 07/22/2023 21:10   DG Tibia/Fibula Right Result Date: 07/22/2023 CLINICAL DATA:  Right shin bruising. EXAM: RIGHT TIBIA AND FIBULA - 2 VIEW COMPARISON:  None Available. FINDINGS: Prior right knee replacement. No visible hardware complicating feature. No acute bony abnormality. Specifically, no fracture, subluxation, or dislocation. Soft tissue swelling in the anterior shin. IMPRESSION: No acute bony abnormality. Electronically Signed   By: Janeece Mechanic  M.D.   On: 07/22/2023 21:09   DG Shoulder Right Result Date: 07/22/2023 CLINICAL DATA:  Right shoulder bruising. EXAM: RIGHT SHOULDER - 2+ VIEW COMPARISON:  None Available. FINDINGS: Degenerative changes in the [facility] joint with joint space narrowing and spurring. Glenohumeral joint is maintained. No acute bony abnormality. Specifically, no fracture, subluxation, or dislocation. Soft tissues are intact. IMPRESSION: Degenerative changes in the right AC joint. No acute bony abnormality. Electronically Signed   By: Janeece Mechanic M.D.   On: 07/22/2023 21:08    Microbiology: Results for orders placed or performed during the hospital encounter of 08/23/22  Culture, blood (Routine X 2) w Reflex to ID Panel     Status: None   Collection Time: 08/26/22  2:11 PM   Specimen: BLOOD  Result Value Ref Range Status   Specimen Description   Final    BLOOD BLOOD RIGHT ARM Performed at [facility], [address] [place], [place] [address]    Special Requests   Final    BOTTLES DRAWN AEROBIC AND ANAEROBIC Blood Culture adequate volume Performed at [facility], [address] [place], [place] [address]    Culture   Final    NO GROWTH 5 DAYS Performed at [facility], [address], [place], [place] [address]    Report Status 08/31/2022 FINAL  Final  Culture, blood (Routine X 2) w Reflex to ID Panel     Status: None   Collection Time: 08/26/22  2:16 PM   Specimen: BLOOD  Result Value Ref Range Status   Specimen Description   Final    BLOOD BLOOD LEFT ARM Performed at [facility], [address] [place], [place] [address]    Special Requests   Final    BOTTLES DRAWN AEROBIC AND ANAEROBIC Blood Culture adequate volume Performed at [facility], [address], [place], [place] [address]    Culture   Final    NO GROWTH 5 DAYS Performed at [facility], [address], [place], [place] [address]    Report Status 08/31/2022 FINAL  Final   *Note: Due to a large number of results and/or encounters for the requested time period, some results have not been displayed. A complete set of results can be found in Results Review.    Labs: CBC: Recent Labs  Lab 07/22/23 2102 07/23/23 0521 07/23/23 0935 07/23/23 1708 07/24/23 0553 07/25/23 0648 07/26/23 0602  WBC 8.0 7.3  --   --  9.7 7.5 6.7  NEUTROABS 4.6 4.0  --   --   --   --   --   HGB 10.7* 10.5* 10.4* 10.4* 10.3* 10.1* 10.3*  HCT 33.7* 33.8* 34.3* 33.9* 33.9* 32.1* 33.6*  MCV 83.4 84.7  --   --  86.0 84.5 86.2  PLT 380 356  --   --  374 368 371   Basic Metabolic Panel: Recent Labs  Lab 07/22/23 2102 07/23/23 0521  NA 139 141  K 4.0 4.0  CL 102 106  CO2 26 24  GLUCOSE 133* 120*  BUN 14 15  CREATININE 0.84 0.66  CALCIUM  7.7* 7.9*  MG  --  1.7   Liver Function Tests: Recent Labs  Lab 07/22/23 2102 07/23/23 0521  AST 19 17  ALT 19 18  ALKPHOS 76 77  BILITOT 0.3 0.4  PROT 8.2* 8.0  ALBUMIN  3.6 3.7   CBG: Recent Labs  Lab 07/25/23 0729 07/25/23 1146 07/25/23 1618  07/25/23 2136  07/26/23 0728  GLUCAP 159* 118* 151* 141* 121*    Discharge time spent: 35 minutes.  Signed: Aisha Hove, MD [facility] 07/26/2023

## 2023-07-26 NOTE — Evaluation (Signed)
 Physical Therapy Evaluation Patient Details Name: [name] MRN: [id number] DOB: Dec 16, 1955 Today's Date: 07/26/2023  History of Present Illness  [name] is a 68 yr old female admitted to the hospital with R LE pain and bruising and was found to have supratherapeutic INR and acute on chronic anemia. 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 Patient reports legs are  sore. Patient ambulated x 80'  slowly using Rw. Patient   reports desire to return home, patient has PCA daily for several hours. Rec. HHPT. BP142/82 sitting, 130/89 post ambulation. Patient reports slight dizziness.      If plan is discharge home, recommend the following: A little help with walking and/or transfers;A little help with bathing/dressing/bathroom;Assistance with cooking/housework;Assist for transportation;Help with stairs or ramp for entrance   Can travel by private vehicle        Equipment Recommendations None recommended by PT  Recommendations for Other Services       Functional Status Assessment Patient has had a recent decline in their functional status and demonstrates the ability to make significant improvements in function in a reasonable and predictable amount of time.     Precautions / Restrictions Precautions Precautions: Fall      Mobility  Bed Mobility   Bed Mobility: Supine to Sit     Supine to sit: Contact guard, HOB elevated          Transfers Overall transfer level: Needs assistance Equipment used: Rolling walker (2 wheels) Transfers: Sit to/from Stand Sit to Stand: Contact guard assist           General transfer comment: extra time to rise from the  bed, reports legs are sore    Ambulation/Gait Ambulation/Gait assistance: Contact guard assist Gait  Distance (Feet): 80 Feet Assistive device: Rolling walker (2 wheels) Gait Pattern/deviations: Step-through pattern, Step-to pattern Gait velocity: decr     General Gait Details: gait slow, noted dyspnea  Stairs            Wheelchair Mobility     Tilt Bed    Modified Rankin (Stroke Patients Only)       Balance Overall balance assessment: Mild deficits observed, not formally tested, History of Falls     Sitting balance - Comments: static sitting-good. dynamic sitting-fair+   Standing balance support: Bilateral upper extremity supported, During functional activity, Reliant on assistive device for balance   Standing balance comment: CGA with RW                             Pertinent Vitals/Pain Pain Assessment Pain Assessment: Faces Faces Pain Scale: Hurts little more Pain Location: knees/ legs Pain Intervention(s): Monitored during session    Home Living Family/patient expects to be discharged to:: Private residence Living Arrangements: Alone Available Help at Discharge: Personal care attendant Type of Home: Apartment Home Access: Level entry;Elevator       Home Layout: One level Home Equipment: Educational psychologist (4 wheels);BSC/3in1;Cane - single point Additional Comments: Pt reports having a PCA Monday-Friday 12:15-3:15pm, Saturday 8-10am, Sunday 8-9:30am who assists with bathing & cleaning.    Prior Function Prior Level of Function : Needs assist             Mobility Comments: uses rollator ADLs Comments: PCA assists with  shower  Extremity/Trunk Assessment   Upper Extremity Assessment Upper Extremity Assessment: Overall WFL for tasks assessed;Left hand dominant    Lower Extremity Assessment Lower Extremity Assessment: Generalized weakness    Cervical / Trunk Assessment Cervical / Trunk Assessment: Normal  Communication   Communication Communication: No apparent difficulties    Cognition Arousal: Alert Behavior  During Therapy: WFL for tasks assessed/performed   PT - Cognitive impairments: No apparent impairments                         Following commands: Intact       Cueing       General Comments      Exercises     Assessment/Plan    PT Assessment Patient needs continued PT services  PT Problem List Decreased strength;Decreased activity tolerance;Decreased mobility;Decreased balance;Decreased knowledge of precautions;Pain       PT Treatment Interventions DME instruction;Therapeutic activities;Gait training;Functional mobility training;Patient/family education;Therapeutic exercise    PT Goals (Current goals can be found in the Care Plan section)  Acute Rehab PT Goals Patient Stated [place]: go home PT [place] Formulation: With patient Time For [place]na Achievement: 08/09/23 Potential to Achieve Goals: Good    Frequency Min 3X/week     Co-evaluation PT/OT/SLP Co-Evaluation/Treatment: Yes Reason for Co-Treatment: To address functional/ADL transfers PT goals addressed during session: Mobility/safety with mobility OT goals addressed during session: ADL's and self-care       AM-PAC PT "6 Clicks" Mobility  Outcome Measure Help needed turning from your back to your side while in a flat bed without using bedrails?: None Help needed moving from lying on your back to sitting on the side of a flat bed without using bedrails?: A Little Help needed moving to and from a bed to a chair (including a wheelchair)?: A Little Help needed standing up from a chair using your arms (e.g., wheelchair or bedside chair)?: A Little Help needed to walk in hospital room?: A Little Help needed climbing 3-5 steps with a railing? : A Lot 6 Click Score: 18    End of Session Equipment Utilized During Treatment: Gait belt Activity Tolerance: Patient tolerated treatment well;Patient limited by fatigue Patient left: in chair;with call bell/phone within reach Nurse Communication: Mobility status PT  Visit Diagnosis: Unsteadiness on feet (R26.81);Muscle weakness (generalized) (M62.81);Pain Pain - Right/Left: Left Pain - part of body: Knee    Time: 1005-1025 PT Time Calculation (min) (ACUTE ONLY): 20 min   Charges:   PT Evaluation $PT Eval Low Complexity: 1 Low   PT General Charges $$ ACUTE PT VISIT: 1 Visit         Abelina Hoes PT Acute Rehabilitation Services Office [phone] Weekend [phone]   Dareen Ebbing 07/26/2023, 1:16 PM

## 2023-07-26 NOTE — Plan of Care (Signed)

## 2023-07-27 ENCOUNTER — Observation Stay (HOSPITAL_COMMUNITY)
Admission: EM | Admit: 2023-07-27 | Discharge: 2023-07-30 | Disposition: A | Discharge status: Home Health | Attending: Internal Medicine | Admitting: Internal Medicine

## 2023-07-27 ENCOUNTER — Observation Stay (HOSPITAL_COMMUNITY)

## 2023-07-27 ENCOUNTER — Emergency Department (HOSPITAL_COMMUNITY)

## 2023-07-27 ENCOUNTER — Encounter (HOSPITAL_COMMUNITY): Payer: Self-pay | Admitting: Emergency Medicine

## 2023-07-27 ENCOUNTER — Other Ambulatory Visit: Payer: Self-pay

## 2023-07-27 DIAGNOSIS — E119 Type 2 diabetes mellitus without complications: Secondary | ICD-10-CM

## 2023-07-27 DIAGNOSIS — Z7984 Long term (current) use of oral hypoglycemic drugs: Secondary | ICD-10-CM | POA: Insufficient documentation

## 2023-07-27 DIAGNOSIS — Z86718 Personal history of other venous thrombosis and embolism: Secondary | ICD-10-CM | POA: Diagnosis not present

## 2023-07-27 DIAGNOSIS — I6523 Occlusion and stenosis of bilateral carotid arteries: Secondary | ICD-10-CM | POA: Diagnosis not present

## 2023-07-27 DIAGNOSIS — Z79899 Other long term (current) drug therapy: Secondary | ICD-10-CM | POA: Insufficient documentation

## 2023-07-27 DIAGNOSIS — D6862 Lupus anticoagulant syndrome: Secondary | ICD-10-CM | POA: Insufficient documentation

## 2023-07-27 DIAGNOSIS — K449 Diaphragmatic hernia without obstruction or gangrene: Secondary | ICD-10-CM | POA: Diagnosis not present

## 2023-07-27 DIAGNOSIS — J4489 Other specified chronic obstructive pulmonary disease: Secondary | ICD-10-CM | POA: Insufficient documentation

## 2023-07-27 DIAGNOSIS — M6281 Muscle weakness (generalized): Secondary | ICD-10-CM | POA: Insufficient documentation

## 2023-07-27 DIAGNOSIS — G319 Degenerative disease of nervous system, unspecified: Secondary | ICD-10-CM | POA: Diagnosis not present

## 2023-07-27 DIAGNOSIS — R569 Unspecified convulsions: Secondary | ICD-10-CM | POA: Diagnosis not present

## 2023-07-27 DIAGNOSIS — Z6841 Body Mass Index (BMI) 40.0 and over, adult: Secondary | ICD-10-CM | POA: Diagnosis not present

## 2023-07-27 DIAGNOSIS — R760 Raised antibody titer: Secondary | ICD-10-CM | POA: Diagnosis not present

## 2023-07-27 DIAGNOSIS — I10 Essential (primary) hypertension: Secondary | ICD-10-CM | POA: Diagnosis present

## 2023-07-27 DIAGNOSIS — N179 Acute kidney failure, unspecified: Secondary | ICD-10-CM | POA: Diagnosis not present

## 2023-07-27 DIAGNOSIS — E876 Hypokalemia: Secondary | ICD-10-CM | POA: Diagnosis not present

## 2023-07-27 DIAGNOSIS — R0989 Other specified symptoms and signs involving the circulatory and respiratory systems: Secondary | ICD-10-CM | POA: Diagnosis not present

## 2023-07-27 DIAGNOSIS — I6782 Cerebral ischemia: Secondary | ICD-10-CM | POA: Diagnosis not present

## 2023-07-27 DIAGNOSIS — E039 Hypothyroidism, unspecified: Secondary | ICD-10-CM | POA: Insufficient documentation

## 2023-07-27 DIAGNOSIS — Z7901 Long term (current) use of anticoagulants: Secondary | ICD-10-CM

## 2023-07-27 DIAGNOSIS — K5900 Constipation, unspecified: Secondary | ICD-10-CM | POA: Diagnosis present

## 2023-07-27 DIAGNOSIS — R2681 Unsteadiness on feet: Secondary | ICD-10-CM | POA: Diagnosis not present

## 2023-07-27 DIAGNOSIS — I5032 Chronic diastolic (congestive) heart failure: Secondary | ICD-10-CM | POA: Diagnosis not present

## 2023-07-27 DIAGNOSIS — R55 Syncope and collapse: Principal | ICD-10-CM

## 2023-07-27 DIAGNOSIS — I517 Cardiomegaly: Secondary | ICD-10-CM | POA: Diagnosis not present

## 2023-07-27 DIAGNOSIS — E1142 Type 2 diabetes mellitus with diabetic polyneuropathy: Secondary | ICD-10-CM | POA: Diagnosis not present

## 2023-07-27 DIAGNOSIS — R404 Transient alteration of awareness: Secondary | ICD-10-CM | POA: Diagnosis not present

## 2023-07-27 DIAGNOSIS — R9389 Abnormal findings on diagnostic imaging of other specified body structures: Secondary | ICD-10-CM | POA: Diagnosis not present

## 2023-07-27 LAB — PROTIME-INR
INR: 1.8 — ABNORMAL HIGH (ref 0.8–1.2)
Prothrombin Time: 21.2 s — ABNORMAL HIGH (ref 11.4–15.2)

## 2023-07-27 LAB — CBC WITH DIFFERENTIAL/PLATELET
Abs Immature Granulocytes: 0.01 10*3/uL (ref 0.00–0.07)
Basophils Absolute: 0 10*3/uL (ref 0.0–0.1)
Basophils Relative: 0 %
Eosinophils Absolute: 0.3 10*3/uL (ref 0.0–0.5)
Eosinophils Relative: 4 %
HCT: 31.7 % — ABNORMAL LOW (ref 36.0–46.0)
Hemoglobin: 9.8 g/dL — ABNORMAL LOW (ref 12.0–15.0)
Immature Granulocytes: 0 %
Lymphocytes Relative: 36 %
Lymphs Abs: 2.4 10*3/uL (ref 0.7–4.0)
MCH: 26.4 pg (ref 26.0–34.0)
MCHC: 30.9 g/dL (ref 30.0–36.0)
MCV: 85.4 fL (ref 80.0–100.0)
Monocytes Absolute: 0.4 10*3/uL (ref 0.1–1.0)
Monocytes Relative: 6 %
Neutro Abs: 3.5 10*3/uL (ref 1.7–7.7)
Neutrophils Relative %: 54 %
Platelets: 381 10*3/uL (ref 150–400)
RBC: 3.71 MIL/uL — ABNORMAL LOW (ref 3.87–5.11)
RDW: 20 % — ABNORMAL HIGH (ref 11.5–15.5)
WBC: 6.5 10*3/uL (ref 4.0–10.5)
nRBC: 0.3 % — ABNORMAL HIGH (ref 0.0–0.2)

## 2023-07-27 LAB — COMPREHENSIVE METABOLIC PANEL WITH GFR
ALT: 18 U/L (ref 0–44)
AST: 20 U/L (ref 15–41)
Albumin: 3.2 g/dL — ABNORMAL LOW (ref 3.5–5.0)
Alkaline Phosphatase: 68 U/L (ref 38–126)
Anion gap: 11 (ref 5–15)
BUN: 20 mg/dL (ref 8–23)
CO2: 23 mmol/L (ref 22–32)
Calcium: 8.6 mg/dL — ABNORMAL LOW (ref 8.9–10.3)
Chloride: 103 mmol/L (ref 98–111)
Creatinine, Ser: 1.07 mg/dL — ABNORMAL HIGH (ref 0.44–1.00)
GFR, Estimated: 57 mL/min — ABNORMAL LOW (ref >60)
Glucose, Bld: 219 mg/dL — ABNORMAL HIGH (ref 70–99)
Potassium: 3.4 mmol/L — ABNORMAL LOW (ref 3.5–5.1)
Sodium: 137 mmol/L (ref 135–145)
Total Bilirubin: 0.5 mg/dL (ref 0.0–1.2)
Total Protein: 7.3 g/dL (ref 6.5–8.1)

## 2023-07-27 LAB — CBG MONITORING, ED: Glucose-Capillary: 210 mg/dL — ABNORMAL HIGH (ref 70–99)

## 2023-07-27 LAB — GLUCOSE, CAPILLARY
Glucose-Capillary: 143 mg/dL — ABNORMAL HIGH (ref 70–99)
Glucose-Capillary: 129 mg/dL — ABNORMAL HIGH (ref 70–99)

## 2023-07-27 LAB — RESP PANEL BY RT-PCR (RSV, FLU A&B, COVID)  RVPGX2
Influenza A by PCR: NEGATIVE
Influenza B by PCR: NEGATIVE
Resp Syncytial Virus by PCR: NEGATIVE
SARS Coronavirus 2 by RT PCR: NEGATIVE

## 2023-07-27 LAB — MAGNESIUM: Magnesium: 1.6 mg/dL — ABNORMAL LOW (ref 1.7–2.4)

## 2023-07-27 LAB — TROPONIN I (HIGH SENSITIVITY)
Troponin I (High Sensitivity): 9 ng/L (ref <18)
Troponin I (High Sensitivity): 8 ng/L (ref <18)

## 2023-07-27 LAB — BRAIN NATRIURETIC PEPTIDE: B Natriuretic Peptide: 19.4 pg/mL (ref 0.0–100.0)

## 2023-07-27 IMAGING — DX DG CHEST 2V
2 series · 2 of 2 positions shown · non-contrast
Comparison: 04/07/2020 and overlapping portion of CT chest from
05/07/2020

CLINICAL DATA: Shortness of breath.  COPD.

EXAM:
CHEST - 2 VIEW

[chest pa]
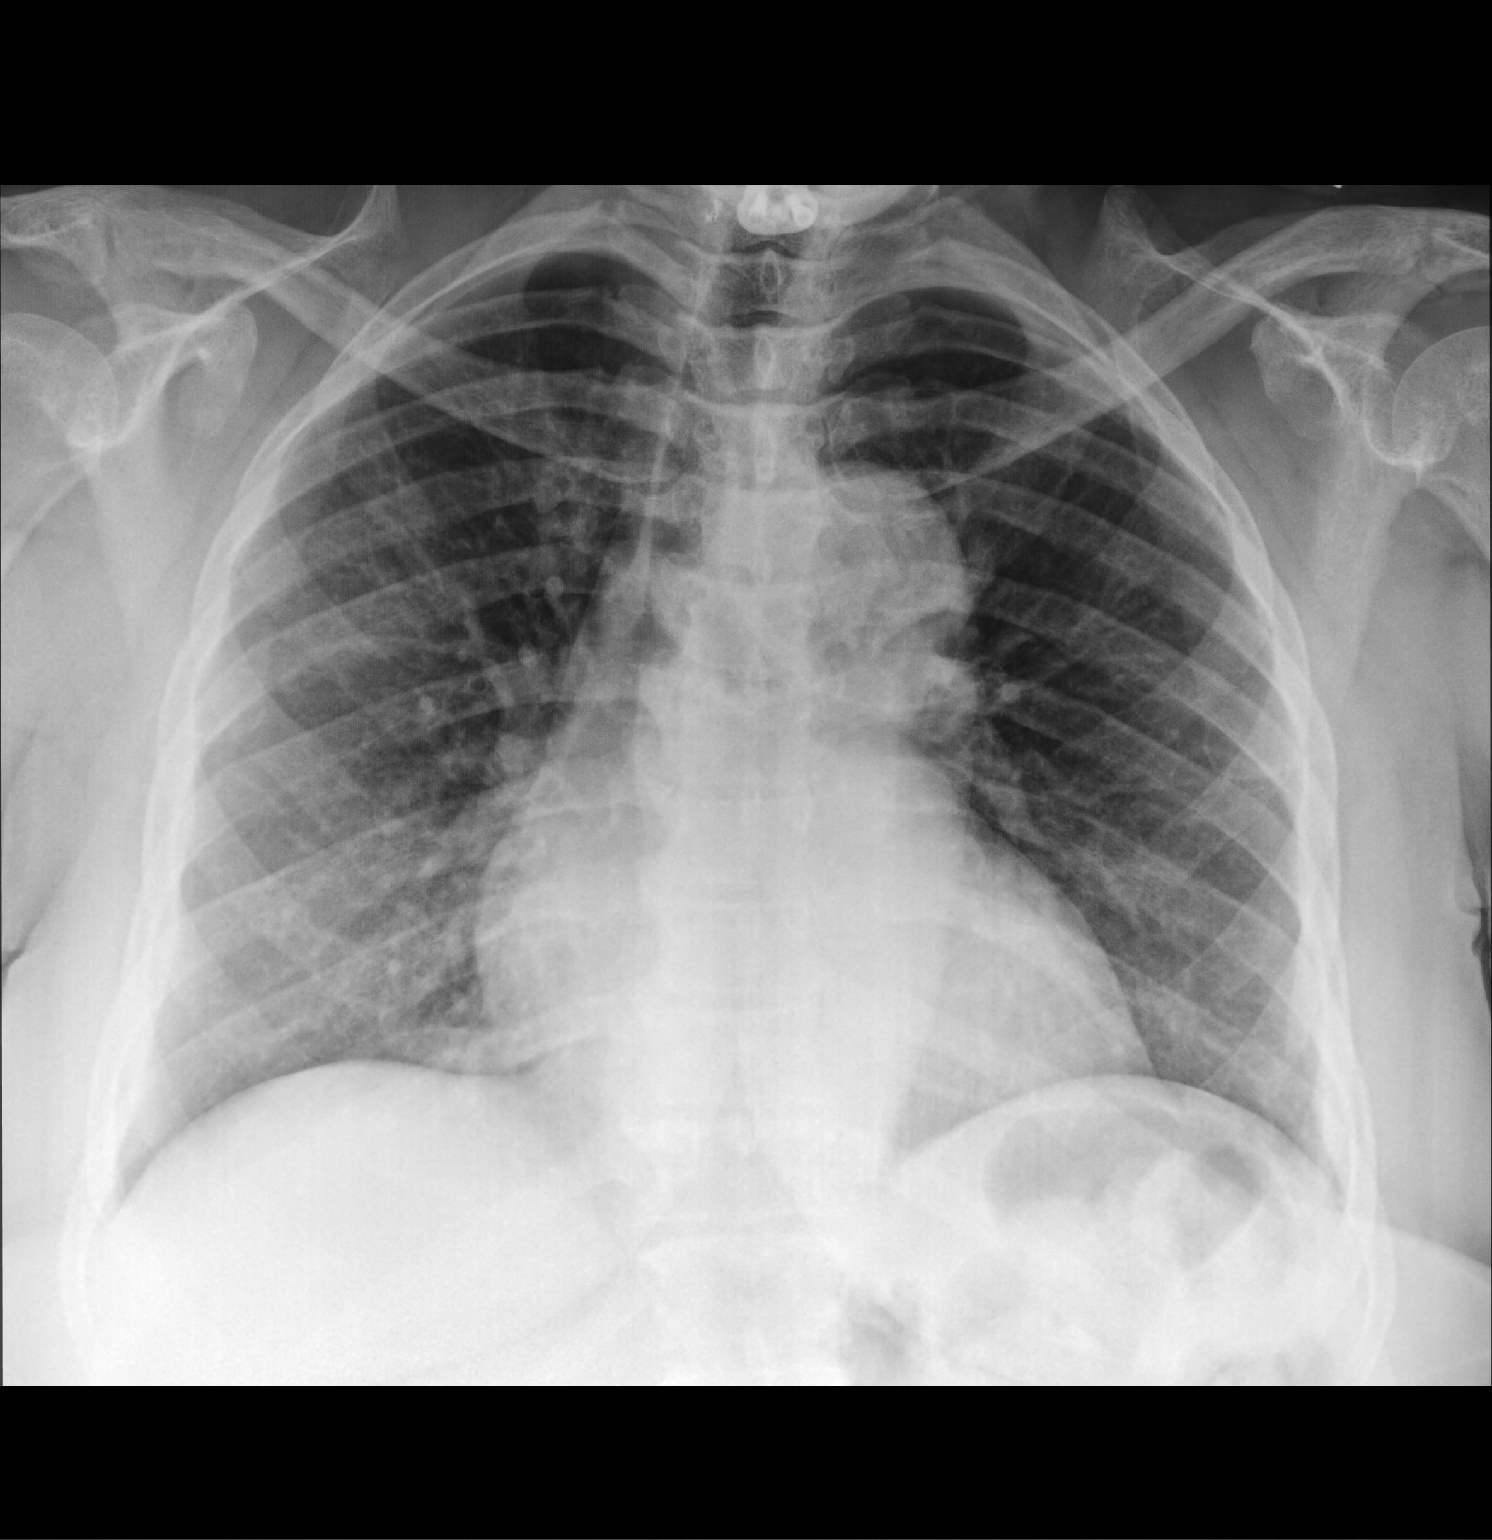

[chest lat]
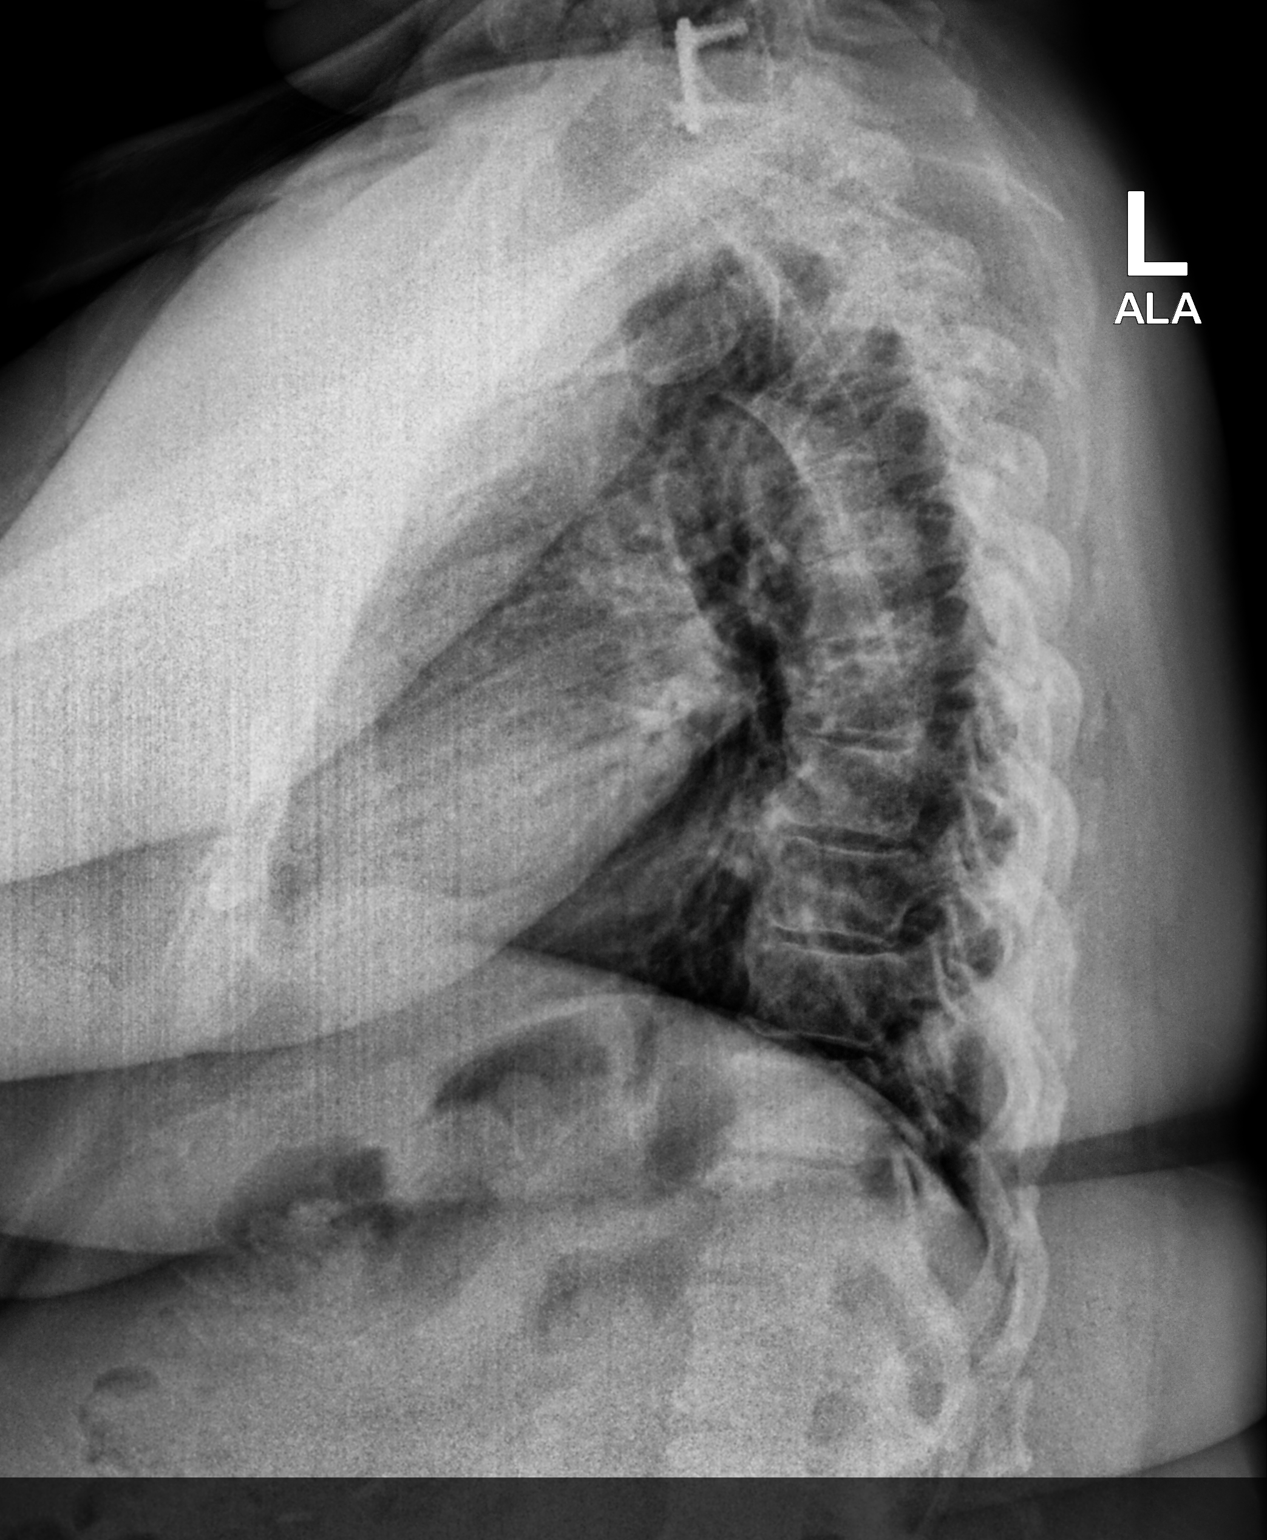

[2 of 2 positions shown; findings below may reference images not displayed]

FINDINGS: Mild enlargement of the cardiopericardial silhouette. The lungs
appear clear. No blunting of the costophrenic angles. Lower cervical
plate and screw fixator. Mild lower thoracic spondylosis.
IMPRESSION: 1. Mild enlargement of the cardiopericardial silhouette. Otherwise
unremarkable.

## 2023-07-27 MED ORDER — SODIUM CHLORIDE 0.9 % IV SOLN: INTRAVENOUS | Status: AC

## 2023-07-27 MED ORDER — POTASSIUM CHLORIDE CRYS ER 20 MEQ PO TBCR
40.0000 meq | EXTENDED_RELEASE_TABLET | Freq: Two times a day (BID) | ORAL | Status: AC
  Administered 2023-07-27 (×2): 40 meq via ORAL
  Filled 2023-07-27 (×2): qty 2

## 2023-07-27 MED ORDER — ACETAMINOPHEN 650 MG RE SUPP: 650.0000 mg | Freq: Four times a day (QID) | RECTAL | Status: DC | PRN

## 2023-07-27 MED ORDER — ROSUVASTATIN CALCIUM 5 MG PO TABS
10.0000 mg | ORAL_TABLET | Freq: Every day | ORAL | Status: DC
  Administered 2023-07-28 – 2023-07-30 (×3): 10 mg via ORAL
  Filled 2023-07-27 (×3): qty 2

## 2023-07-27 MED ORDER — ZONISAMIDE 100 MG PO CAPS
100.0000 mg | ORAL_CAPSULE | Freq: Every day | ORAL | Status: DC
  Administered 2023-07-28 – 2023-07-30 (×3): 100 mg via ORAL
  Filled 2023-07-27 (×3): qty 1

## 2023-07-27 MED ORDER — ZONISAMIDE 100 MG PO CAPS
100.0000 mg | ORAL_CAPSULE | Freq: Two times a day (BID) | ORAL | Status: DC
  Filled 2023-07-27: qty 1

## 2023-07-27 MED ORDER — INSULIN ASPART 100 UNIT/ML IJ SOLN: 0.0000 [IU] | Freq: Every day | INTRAMUSCULAR | Status: DC

## 2023-07-27 MED ORDER — SODIUM CHLORIDE 0.9 % IV SOLN
Freq: Once | INTRAVENOUS | Status: AC
  Administered 2023-07-27: 100 mL/h via INTRAVENOUS

## 2023-07-27 MED ORDER — INSULIN ASPART 100 UNIT/ML IJ SOLN
4.0000 [IU] | Freq: Three times a day (TID) | INTRAMUSCULAR | Status: DC
  Administered 2023-07-28 – 2023-07-30 (×7): 4 [IU] via SUBCUTANEOUS

## 2023-07-27 MED ORDER — ACETAMINOPHEN 325 MG PO TABS
650.0000 mg | ORAL_TABLET | Freq: Four times a day (QID) | ORAL | Status: DC | PRN
  Administered 2023-07-28 (×3): 650 mg via ORAL
  Filled 2023-07-27 (×2): qty 2

## 2023-07-27 MED ORDER — ALBUTEROL SULFATE (2.5 MG/3ML) 0.083% IN NEBU: 3.0000 mL | INHALATION_SOLUTION | RESPIRATORY_TRACT | Status: DC | PRN

## 2023-07-27 MED ORDER — FLUTICASONE FUROATE-VILANTEROL 200-25 MCG/ACT IN AEPB
1.0000 | INHALATION_SPRAY | Freq: Every day | RESPIRATORY_TRACT | Status: DC
  Administered 2023-07-28 – 2023-07-30 (×3): 1 via RESPIRATORY_TRACT
  Filled 2023-07-27: qty 28

## 2023-07-27 MED ORDER — WARFARIN SODIUM 5 MG PO TABS
10.0000 mg | ORAL_TABLET | Freq: Once | ORAL | Status: AC
  Administered 2023-07-27: 10 mg via ORAL
  Filled 2023-07-27: qty 1
  Filled 2023-07-27 (×2): qty 2

## 2023-07-27 MED ORDER — ONDANSETRON HCL 4 MG/2ML IJ SOLN: 4.0000 mg | Freq: Four times a day (QID) | INTRAMUSCULAR | Status: DC | PRN

## 2023-07-27 MED ORDER — QUETIAPINE FUMARATE 50 MG PO TABS
150.0000 mg | ORAL_TABLET | Freq: Every day | ORAL | Status: DC
  Administered 2023-07-27 – 2023-07-29 (×3): 150 mg via ORAL
  Filled 2023-07-27 (×3): qty 3

## 2023-07-27 MED ORDER — PANTOPRAZOLE SODIUM 40 MG PO TBEC
40.0000 mg | DELAYED_RELEASE_TABLET | Freq: Every day | ORAL | Status: DC
  Administered 2023-07-27 – 2023-07-30 (×4): 40 mg via ORAL
  Filled 2023-07-27 (×5): qty 1

## 2023-07-27 MED ORDER — POLYETHYLENE GLYCOL 3350 17 G PO PACK
17.0000 g | PACK | Freq: Two times a day (BID) | ORAL | Status: AC
  Administered 2023-07-27 – 2023-07-29 (×3): 17 g via ORAL
  Filled 2023-07-27 (×4): qty 1

## 2023-07-27 MED ORDER — GABAPENTIN 300 MG PO CAPS: 300.0000 mg | ORAL_CAPSULE | Freq: Two times a day (BID) | ORAL | Status: DC

## 2023-07-27 MED ORDER — SODIUM CHLORIDE 0.9 % IV BOLUS
1000.0000 mL | Freq: Once | INTRAVENOUS | Status: AC
  Administered 2023-07-27: 1000 mL via INTRAVENOUS

## 2023-07-27 MED ORDER — DULOXETINE HCL 60 MG PO CPEP
90.0000 mg | ORAL_CAPSULE | Freq: Every day | ORAL | Status: DC
  Administered 2023-07-27 – 2023-07-29 (×3): 90 mg via ORAL
  Filled 2023-07-27 (×3): qty 1

## 2023-07-27 MED ORDER — ONDANSETRON HCL 4 MG PO TABS: 4.0000 mg | ORAL_TABLET | Freq: Four times a day (QID) | ORAL | Status: DC | PRN

## 2023-07-27 MED ORDER — ZONISAMIDE 100 MG PO CAPS
200.0000 mg | ORAL_CAPSULE | Freq: Every day | ORAL | Status: DC
  Administered 2023-07-27 – 2023-07-29 (×3): 200 mg via ORAL
  Filled 2023-07-27 (×3): qty 2

## 2023-07-27 MED ORDER — WARFARIN - PHARMACIST DOSING INPATIENT: Freq: Every day | Status: DC

## 2023-07-27 MED ORDER — IOHEXOL 350 MG/ML SOLN
75.0000 mL | Freq: Once | INTRAVENOUS | Status: AC | PRN
  Administered 2023-07-27: 75 mL via INTRAVENOUS

## 2023-07-27 MED ORDER — INSULIN ASPART 100 UNIT/ML IJ SOLN
0.0000 [IU] | Freq: Three times a day (TID) | INTRAMUSCULAR | Status: DC
  Administered 2023-07-29 (×2): 3 [IU] via SUBCUTANEOUS
  Administered 2023-07-29 – 2023-07-30 (×2): 2 [IU] via SUBCUTANEOUS

## 2023-07-27 NOTE — ED Triage Notes (Signed)
 From home, unresponsive, EMS was BLS unit

## 2023-07-27 NOTE — Progress Notes (Signed)
 Routine EEG completed, results pending Neurology review and interpretation

## 2023-07-27 NOTE — ED Notes (Signed)
 Patient transported to CT

## 2023-07-27 NOTE — Progress Notes (Signed)
 MB called and spoke to Nurse, Pt will be heading to MRI and is not available. Check back as schedule permits.

## 2023-07-27 NOTE — ED Provider Notes (Signed)
 [place] [facility] AT [facility] Provider Note   CSN: [id number] Arrival date & time: 07/27/23  4782     History Chief Complaint  Patient presents with   Near Syncope    [name] is a 68 y.o. female.  Patient past history significant for CHF, hypertension, morbid obesity, type 2 diabetes, supratherapeutic INR presents the emergency department today with concerns of near syncopal episode.  Patient reportedly was found somewhat unresponsive at home and not following commands.  She has not fully returned to baseline.  Uncertain last known normal.  No obvious injuries as far as patient can verbalize to me.  Patient herself has no specific complaints when asked but does report that she has some generalized pain.  Was able to speak with patient's daughter at bedside reports that she currently lives alone.  No reported falls or injuries recently.   Near Syncope       Home Medications Prior to Admission medications   Medication Sig Start Date End Date Taking? Authorizing Provider  ACCU-CHEK GUIDE TEST test strip USE TO TEST BLOOD SUGAR UP TO FOUR TIMES A DAY AS DIRECTED 03/19/23   Viola Greulich, MD  Accu-Chek Softclix Lancets lancets USE TO TEST BLOOD SUGAR UP TO FOUR TIMES A DAY AS DIRECTED 06/22/23   Viola Greulich, MD  albuterol  (PROVENTIL ) (2.5 MG/3ML) 0.083% nebulizer solution USE 1 VIAL IN NEBULIZER EVERY 6 HOURS - and as needed 06/12/23   Viola Greulich, MD  albuterol  (VENTOLIN  HFA) 108 (90 Base) MCG/ACT inhaler INHALE TWO (2) PUFFS BY MOUTH EVERY 4 HOURS AS NEEDED FOR SHORTNESS OF BREATH 02/13/23   Viola Greulich, MD  Blood Glucose Monitoring Suppl (ACCU-CHEK GUIDE) w/Device KIT USE TO TEST BLOOD SUGAR 3 TIMES DAILY 07/09/23   Viola Greulich, MD  canagliflozin  (INVOKANA ) 100 MG TABS tablet Take 1 tablet (100 mg total) by mouth daily before breakfast. 11/10/22   Chandrasekhar, [name] A, MD  cloNIDine  (CATAPRES ) 0.1 MG tablet TAKE ONE (1) TABLET  BY MOUTH TWICE DAILY BEFORE BREAKFAST AND AT BEDTIME 03/19/23   Viola Greulich, MD  diclofenac sodium (VOLTAREN) 1 % GEL Apply 2 g topically daily as needed (fibromyalgia pain- affected sites).    [provider]  diltiazem  (CARDIZEM  CD) 240 MG 24 hr capsule TAKE 1 CAPSULE BY MOUTH DAILY BEFORE BREAKFAST *REFILL REQUEST* 11/10/22   Viola Greulich, MD  DULoxetine  (CYMBALTA ) 30 MG capsule Take 90 mg by mouth at bedtime.    [provider]  FEROSUL 325 (65 Fe) MG tablet TAKE ONE TABLET BY MOUTH ONCE DAILY 07/19/21   Viola Greulich, MD  fexofenadine  (ALLEGRA ) 180 MG tablet Take 1 tablet (180 mg total) by mouth daily. 03/05/23   Viola Greulich, MD  fluticasone  (FLONASE ) 50 MCG/ACT nasal spray INSTILL 2 SPRAYS IN EACH NOSTRIL TWICE DAILY 06/25/23   Viola Greulich, MD  furosemide  (LASIX ) 80 MG tablet TAKE ONE (1) TABLET BY MOUTH TWICE DAILY 01/12/23   Chandrasekhar, [name] A, MD  gabapentin  (NEURONTIN ) 300 MG capsule TAKE ONE (1) CAPSULE BY MOUTH TWICE DAILY 06/13/23   Viola Greulich, MD  Glucose Blood (BLOOD GLUCOSE TEST STRIPS) STRP 1 each by In Vitro route in the morning, at noon, and at bedtime. May substitute to any manufacturer covered by patient's insurance. 06/11/23   Viola Greulich, MD  Incontinence Supply Disposable (PROCARE BARIATRIC BRIEFS) MISC Use as needed. 03/05/23   Viola Greulich, MD  Lancets Misc. MISC  May substitute to any manufacturer covered by patient's insurance. 06/11/23   Viola Greulich, MD  lidocaine  (LIDODERM ) 5 % APPLY 1 PATCH TOPICALLY ON THE SKIN DAILY AS NEEDED TO PAINFUL SITE *12 HOURS ON AND 12 HOURS OFF OR AS DIRECTED BY MD* 07/12/23   Viola Greulich, MD  LINZESS  145 MCG CAPS capsule TAKE 1 CAPSULE BY MOUTH EVERY OTHER DAY 02/28/23   Viola Greulich, MD  magic mouthwash (multi-ingredient) oral suspension Swish and spit 5 mLs 4 (four) times daily as needed for mouth pain. 07/18/23   Viola Greulich, MD  metFORMIN  (GLUCOPHAGE ) 500 MG tablet TAKE  1 TABLET BY MOUTH EVERY DAY AT BEDTIME *REFILL REQUEST* 11/10/22   Viola Greulich, MD  methocarbamol  (ROBAXIN ) 500 MG tablet Take 1 tablet (500 mg total) by mouth 2 (two) times daily. Patient taking differently: Take 500 mg by mouth 2 (two) times daily as needed for muscle spasms. 03/22/22   Blue, Soijett A, PA-C  omeprazole  (PRILOSEC) 20 MG capsule Take 1 capsule (20 mg total) by mouth 2 (two) times daily before a meal. 02/13/23   Viola Greulich, MD  potassium chloride  (MICRO-K ) 10 MEQ CR capsule TAKE TWO (2) CAPSULES BY MOUTH EVERY DAY AT BEDTIME 03/19/23   Viola Greulich, MD  QUEtiapine  (SEROQUEL ) 100 MG tablet Take 150 mg by mouth at bedtime. 07/11/23   [provider]  QUEtiapine  (SEROQUEL ) 50 MG tablet Take 50 mg by mouth in the morning and at bedtime. Patient not taking: Reported on 07/23/2023    [provider]  rosuvastatin  (CRESTOR ) 10 MG tablet Take 1 tablet (10 mg total) by mouth daily. 02/02/23 07/23/23  Gloriann Larger A, MD  Semaglutide , 2 MG/DOSE, (OZEMPIC , 2 MG/DOSE,) 8 MG/3ML SOPN Inject 2 mg into the skin once a week. 12/22/22   Viola Greulich, MD  spironolactone  (ALDACTONE ) 25 MG tablet TAKE 1/2 TABLET BY MOUTH ONCE DAILY Patient not taking: Reported on 07/23/2023 10/27/22   Jann Melody, MD  SYMBICORT  160-4.5 MCG/ACT inhaler INHALE TWO (2) PUFFS BY MOUTH TWICE DAILY 03/19/23   Viola Greulich, MD  Vitamin D , Ergocalciferol , (DRISDOL ) 1.25 MG (50000 UNIT) CAPS capsule Take 1 capsule (50,000 Units total) by mouth every 7 (seven) days. Patient not taking: Reported on 07/23/2023 07/13/23   Viola Greulich, MD  warfarin (COUMADIN ) 10 MG tablet Take daily as directed by MD 01/12/23   Sumner Ends, MD  warfarin (COUMADIN ) 7.5 MG tablet TAKE 1 TABLET BY MOUTH DAILY 02/12/23   Sumner Ends, MD  zonisamide  (ZONEGRAN ) 100 MG capsule TAKE 1 CAPSULE BY MOUTH EACH MORNING AND TAKE 2 CAPSULES IN THE EVENING 04/19/23   Viola Greulich, MD      Allergies     Lisinopril , Prednisone , Spironolactone , and Corticosteroids    Review of Systems   Review of Systems  Cardiovascular:  Positive for near-syncope.  Neurological:  Positive for syncope and weakness.  All other systems reviewed and are negative.   Physical Exam Updated Vital Signs BP (!) 144/88   Pulse 74   Temp 97.8 F (36.6 C) (Oral)   Resp 19   Ht 5\' 7"  (1.702 m)   Wt 134 kg   SpO2 100%   BMI 46.27 kg/m  Physical Exam Vitals and nursing note reviewed.  Constitutional:      General: She is not in acute distress.    Appearance: She is well-developed.  HENT:     Head: Normocephalic and atraumatic.  Eyes:  Conjunctiva/sclera: Conjunctivae normal.  Cardiovascular:     Rate and Rhythm: Normal rate and regular rhythm.     Heart sounds: No murmur heard. Pulmonary:     Effort: Pulmonary effort is normal. No respiratory distress.     Breath sounds: Normal breath sounds.  Abdominal:     General: Abdomen is flat. Bowel sounds are normal. There is no distension.     Palpations: Abdomen is soft.     Tenderness: There is no abdominal tenderness. There is no guarding.  Musculoskeletal:        General: No swelling, tenderness, deformity or signs of injury.     Cervical back: Neck supple.  Skin:    General: Skin is warm and dry.     Capillary Refill: Capillary refill takes less than 2 seconds.  Neurological:     Mental Status: She is alert. She is disoriented.     Motor: Weakness present.     Comments: Patient unable to fully respond to questions regarding her current reason for ED visit.  Psychiatric:        Mood and Affect: Mood normal.     ED Results / Procedures / Treatments   Labs (all labs ordered are listed, but only abnormal results are displayed) Labs Reviewed  CBC WITH DIFFERENTIAL/PLATELET - Abnormal; Notable for the following components:      Result Value   RBC 3.71 (*)    Hemoglobin 9.8 (*)    HCT 31.7 (*)    RDW 20.0 (*)    nRBC 0.3 (*)    All other  components within normal limits  COMPREHENSIVE METABOLIC PANEL WITH GFR - Abnormal; Notable for the following components:   Potassium 3.4 (*)    Glucose, Bld 219 (*)    Creatinine, Ser 1.07 (*)    Calcium  8.6 (*)    Albumin  3.2 (*)    GFR, Estimated 57 (*)    All other components within normal limits  PROTIME-INR - Abnormal; Notable for the following components:   Prothrombin Time 21.2 (*)    INR 1.8 (*)    All other components within normal limits  CBG MONITORING, ED - Abnormal; Notable for the following components:   Glucose-Capillary 210 (*)    All other components within normal limits  RESP PANEL BY RT-PCR (RSV, FLU A&B, COVID)  RVPGX2  BRAIN NATRIURETIC PEPTIDE  URINALYSIS, ROUTINE W REFLEX MICROSCOPIC  TROPONIN I (HIGH SENSITIVITY)  TROPONIN I (HIGH SENSITIVITY)    EKG EKG Interpretation Date/Time:  Friday Jul 27 2023 09:54:03 EDT Ventricular Rate:  76 PR Interval:  163 QRS Duration:  96 QT Interval:  453 QTC Calculation: 510 R Axis:   42  Text Interpretation: Sinus rhythm Confirmed by Lowery Rue [phone]) on 07/27/2023 10:10:54 AM  Radiology CT Angio Chest PE W and/or Wo Contrast Result Date: 07/27/2023 CLINICAL DATA:  Pulmonary embolism suspected EXAM: CT ANGIOGRAPHY CHEST WITH CONTRAST TECHNIQUE: Multidetector CT imaging of the chest was performed using the standard protocol during bolus administration of intravenous contrast. Multiplanar CT image reconstructions and MIPs were obtained to evaluate the vascular anatomy. Multiplanar image (3D post-processing) reconstructions and MIPs were obtained to evaluate the vascular anatomy. RADIATION DOSE REDUCTION: This exam was performed according to the departmental dose-optimization program which includes automated exposure control, adjustment of the mA and/or kV according to patient size and/or use of iterative reconstruction technique. CONTRAST:  75mL OMNIPAQUE  IOHEXOL  350 MG/ML SOLN COMPARISON:  CT of the heart performed  May 07, 2020 FINDINGS: Cardiovascular: Enlarged  heart. No thoracic aortic aneurysm. Two vessel aortic arch. The descending thoracic aorta is grossly patent and unremarkable. Main pulmonary artery is within normal limits for diameter. There is no evidence of pulmonary embolism to the segmental level. Mediastinum/Nodes: No evidence of mediastinal lymphadenopathy. Mildly prominent axillary lymph nodes which may be reactive. Lungs/Pleura: A 5 mm noncalcified right lower lobe pulmonary nodule is present and unchanged compared to August 27, 2022. No pleural effusion or pneumothorax. Upper Abdomen: Cholecystectomy. Unchanged right adrenal nodule measuring 3.1 cm. Small hiatal hernia. Musculoskeletal: Degenerative changes in the imaged osseous structures. Review of the MIP images confirms the above findings. IMPRESSION: 1. No evidence of pulmonary embolism to the segmental level. Electronically Signed   By: Reagan Camera M.D.   On: 07/27/2023 13:38   CT Head Wo Contrast Result Date: 07/27/2023 CLINICAL DATA:  Mental status change, unknown cause, near syncope EXAM: CT HEAD WITHOUT CONTRAST TECHNIQUE: Contiguous axial images were obtained from the base of the skull through the vertex without intravenous contrast. RADIATION DOSE REDUCTION: This exam was performed according to the departmental dose-optimization program which includes automated exposure control, adjustment of the mA and/or kV according to patient size and/or use of iterative reconstruction technique. COMPARISON:  August 23, 2022 FINDINGS: Brain: The ventricles appear age appropriate. No mass effect or midline shift. Gray-white differentiation is preserved. Scattered periventricular white matter hypoattenuation, most consistent with changes of mild chronic ischemic microvascular disease. 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 No evidence of acute territorial infarction, extra-axial fluid collection, hemorrhage, or mass lesion. The sella and  pituitary are within normal limits. The basilar cisterns are patent without downward herniation. The cerebellar hemispheres and vermis are well formed without mass lesion or focal attenuation abnormality. Vascular: No hyperdense vessel. Calcified atherosclerotic plaque within the cavernous/supraclinoid internal carotid arteries. Skull: Normal. Negative for fracture or focal lesion. Sinuses/Orbits: The paranasal sinuses and mastoids are clear. The globes appear intact. No retrobulbar hematoma. Bilateral lens replacements. Other: None. IMPRESSION: 1. No acute intracranial abnormality, specifically, no acute hemorrhage, territorial infarction, or intracranial mass. 2. Sequelae of mild, chronic ischemic microvascular disease with chronic lacunar infarct in the left basal ganglia, unchanged. Electronically Signed   By: Rance Burrows M.D.   On: 07/27/2023 13:14   DG Chest Portable 1 View Result Date: 07/27/2023 CLINICAL DATA:  Altered mental status. EXAM: PORTABLE CHEST 1 VIEW COMPARISON:  07/22/2023. FINDINGS: Low lung volume. Bilateral lung fields are clear. Bilateral costophrenic angles are clear. Elevated right hemidiaphragm noted. Stable mildly enlarged cardio-mediastinal silhouette. No acute osseous abnormalities. Lower cervical spinal fixation hardware noted. The soft tissues are within normal limits. IMPRESSION: *No active disease. Electronically Signed   By: Beula Brunswick M.D.   On: 07/27/2023 10:28   VAS US  LOWER EXTREMITY VENOUS (DVT) Result Date: 07/25/2023  Lower Venous DVT Study Patient Name:  SYRIANA SANCHEZ Engen  Date of Exam:   07/25/2023 Medical Rec #: [id number]            Accession #:    [id number] Date of Birth: 1955/05/06            Patient Gender: F Patient Age:   69 years Exam Location:  [facility] Procedure:      VAS US  LOWER EXTREMITY VENOUS (DVT) Referring Phys: Aisha Hove --------------------------------------------------------------------------------  Indications: Pain.   Risk Factors: DVT Hx obesity. Limitations: Body habitus. Comparison Study: No significant changes seen since previous exam 04/25/21. Performing Technologist: Estanislao Heimlich  Examination Guidelines: A complete evaluation includes B-mode  imaging, spectral Doppler, color Doppler, and power Doppler as needed of all accessible portions of each vessel. Bilateral testing is considered an integral part of a complete examination. Limited examinations for reoccurring indications may be performed as noted. The reflux portion of the exam is performed with the patient in reverse Trendelenburg.  +---------+---------------+---------+-----------+----------+--------------+ RIGHT    CompressibilityPhasicitySpontaneityPropertiesThrombus Aging +---------+---------------+---------+-----------+----------+--------------+ CFV      Full           Yes      Yes                                 +---------+---------------+---------+-----------+----------+--------------+ SFJ      Full                                                        +---------+---------------+---------+-----------+----------+--------------+ FV Prox  Full                                                        +---------+---------------+---------+-----------+----------+--------------+ FV Mid   Full                                                        +---------+---------------+---------+-----------+----------+--------------+ FV DistalFull                                                        +---------+---------------+---------+-----------+----------+--------------+ PFV      Full                                                        +---------+---------------+---------+-----------+----------+--------------+ POP      Full           Yes      Yes                                 +---------+---------------+---------+-----------+----------+--------------+ PTV      Full                    Yes                                  +---------+---------------+---------+-----------+----------+--------------+ PERO     Full                    Yes                                 +---------+---------------+---------+-----------+----------+--------------+   +---------+---------------+---------+-----------+----------+--------------+  LEFT     CompressibilityPhasicitySpontaneityPropertiesThrombus Aging +---------+---------------+---------+-----------+----------+--------------+ CFV      Full           Yes      Yes                                 +---------+---------------+---------+-----------+----------+--------------+ SFJ      Full                                                        +---------+---------------+---------+-----------+----------+--------------+ FV Prox  Full                                                        +---------+---------------+---------+-----------+----------+--------------+ FV Mid   Full                                                        +---------+---------------+---------+-----------+----------+--------------+ FV DistalFull                                                        +---------+---------------+---------+-----------+----------+--------------+ PFV      Full                                                        +---------+---------------+---------+-----------+----------+--------------+ POP      Full           Yes      Yes                                 +---------+---------------+---------+-----------+----------+--------------+ PTV      Full                    Yes                                 +---------+---------------+---------+-----------+----------+--------------+ PERO     Full                    Yes                                 +---------+---------------+---------+-----------+----------+--------------+     Summary: BILATERAL: - No evidence of deep vein thrombosis seen in the lower extremities, bilaterally. -No evidence  of popliteal cyst, bilaterally.   *See table(s) above for measurements and observations. Electronically signed by Genny Kid MD on 07/25/2023 at 5:30:40 PM.    Final  Procedures Procedures   Medications Ordered in ED Medications  potassium chloride  SA (KLOR-CON  M) CR tablet 40 mEq (has no administration in time range)  0.9 %  sodium chloride  infusion (has no administration in time range)  sodium chloride  0.9 % bolus 1,000 mL (1,000 mLs Intravenous New Bag/Given 07/27/23 1108)  iohexol  (OMNIPAQUE ) 350 MG/ML injection 75 mL (75 mLs Intravenous Contrast Given 07/27/23 1246)  0.9 %  sodium chloride  infusion (100 mL/hr Intravenous New Bag/Given 07/27/23 1347)    ED Course/ Medical Decision Making/ A&P                                 Medical Decision Making Amount and/or Complexity of Data Reviewed Labs: ordered. Radiology: ordered.  Risk Prescription drug management. Decision regarding hospitalization.   This patient presents to the ED for concern of near syncope.  Differential diagnosis includes vasovagal syncope, dehydration, AKI, DKA, stroke   Lab Tests:  I Ordered, and personally interpreted labs.  The pertinent results include: CBC with anemia with hemoglobin at 9.8, CMP with mild hypokalemia 3.4, some evidence of dehydration with GFR down and creatinine slightly elevated compared to baseline, PT/INR unremarkable compared to most recent labs, BNP normal, troponin normal, respiratory panel negative.   Imaging Studies ordered:  I ordered imaging studies including chest x-ray, CT head, CT angio chest PE I independently visualized and interpreted imaging which showed all imaging unremarkable I agree with the radiologist interpretation   Medicines ordered and prescription drug management:  I ordered medication including fluids for dehydration Reevaluation of the patient after these medicines showed that the patient stayed the same I have reviewed the patients home medicines  and have made adjustments as needed   Problem List / ED Course:  Patient with past history significant for CHF, hypertension, morbid obesity, type 2 diabetes, supratherapeutic INR presents the emergency department today with concerns of a near syncopal episode.  Patient reportedly had an unwitnessed episode in which her head fell backwards while she was sitting on a chair. She has not return back to baseline since this morning. Patient lives at home alone. Exam is concerning for poor responsiveness from patient to questions. Not responding appropriately. No obvious gaze preference or unilateral symptoms to suggest stroke. Patient able to follow finger with eyes but unclear if she is fully understanding questions or commands. Daughter at bedside provides more history. Labs and imaging largely reassuring. No obvious findings to explain current symptoms or source of syncopal episode. Will admit to hospitalist. Dr. Colonel Dears spoke with Dr. Bonita Bussing for admission.    Final Clinical Impression(s) / ED Diagnoses Final diagnoses:  Syncope, unspecified syncope type  Hypokalemia    Rx / DC Orders ED Discharge Orders     None         Concetta Dee, PA-C 07/27/23 1434    Curatolo, Adam, DO 07/27/23 1451

## 2023-07-27 NOTE — Progress Notes (Signed)
 Pt not going to be transported to MRI for another few hours, MB en route for EEG before MRI

## 2023-07-27 NOTE — Procedures (Signed)
 Patient Name: Clydene Kondo Halteman  MRN: [id number]  Epilepsy Attending: Arleene Lack  Referring Physician/Provider: Macdonald Savoy, MD  Date: 07/27/2023 Duration: 26.50 mins  Patient history: 68yo F with syncope. EEG to evaluate for seizure  Level of alertness: Awake, asleep  AEDs during EEG study: GBP, ZNS  Technical aspects: This EEG study was done with scalp electrodes positioned according to the 10-20 International system of electrode placement. Electrical activity was reviewed with band pass filter of 1-70Hz , sensitivity of 7 uV/mm, display speed of 60mm/sec with a 60Hz  notched filter applied as appropriate. EEG data were recorded continuously and digitally stored.  Video monitoring was available and reviewed as appropriate.  Description: The posterior dominant rhythm consists of 8 Hz activity of moderate voltage (25-35 uV) seen predominantly in posterior head regions, symmetric and reactive to eye opening and eye closing. Sleep was characterized by vertex waves, sleep spindles (12 to 14 Hz), maximal frontocentral region. Hyperventilation and photic stimulation were not performed.     IMPRESSION: This study is within normal limits. No seizures or epileptiform discharges were seen throughout the recording.  A normal interictal EEG does not exclude the diagnosis of epilepsy.   Carolyna Yerian O Sarah-Jane Nazario

## 2023-07-27 NOTE — ED Provider Notes (Signed)
 Shared PA visit.  Patient here after loss of consciousness.  Sounds like patient was sitting and lost consciousness was unresponsive.  Does not sound like any seizure activity but slow to respond.  Blood pressure little bit low.  She was in the lobby in her building.  Waiting for family to come.  She just got discharged in the hospital with some issues with her INR and severe bruising to her right lower leg.  Sounds like she has not really done that great being home for a day.  She has been able to ambulate with a walker but very minimally having to basically be in bed.  Family is very concerned about her strength.  She is very uncomfortable on exam.  She is having pain all over.  She is not really responding very well.  She moves all extremities, she has normal speech normal coordination but she is coming globally weak.  But I do not appreciate any unilateral symptoms to make me think we need an active code stroke.  Differential is wide this could be cardiac related syncope or vasovagal process or dehydration or failure to thrive.  Could be neurologic and stroke.  She has been having issues with her INR.  She has a history of DVT.  She had ultrasound done recent hospitalization that showed negative DVTs.  We did a broad workup to evaluate for infectious process cardiac process.  There is no significant leukocytosis anemia or electrolyte abnormality.  Troponin negative.  Viral swab negative.  CT of her head was unremarkable.  CT scan of her chest showed no evidence of PE or pneumonia.  INR still subtherapeutic at 1.8.  No signs of volume overload otherwise on exam.  Ultimately I think she benefit from more of a cardiac workup maybe an MRI of the brain and continue discussion with family about her mobility and her ability to take care of herself at home.  I think she either needs more 24/7 care or placement.  IV fluids were given.  Will admit to medicine for further care.  This chart was dictated using voice  recognition software.  Despite best efforts to proofread,  errors can occur which can change the documentation meaning.    Lowery Rue, DO 07/27/23 1355

## 2023-07-27 NOTE — ED Notes (Signed)
POC BGL 210

## 2023-07-27 NOTE — Progress Notes (Signed)
 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, recurrent DVTs)  Allergies  Allergen Reactions   Lisinopril  Anaphylaxis, Swelling and Other (See Comments)    THROAT SWELLING, Pt states her throat started to "close up" after being on it for "so long."    Prednisone  Anaphylaxis, Swelling and Other (See Comments)    Throat swelling and tongue irritation   Spironolactone  Other (See Comments)    Caused stroke-like symptoms with the mouth   Corticosteroids Rash    States she developed a rash on her tongue after a steroid injection in her back    Patient Measurements: Height: 5\' 7"  (170.2 cm) Weight: 134 kg (295 lb 6.7 oz) IBW/kg (Calculated) : 61.6 HEPARIN  DW (KG): 94.1  Vital Signs: Temp: 97.8 F (36.6 C) (05/09 1349) Temp Source: Oral (05/09 1349) BP: 144/88 (05/09 1400) Pulse Rate: 74 (05/09 1400)  Labs: Recent Labs    07/25/23 0558 07/25/23 0648 07/25/23 0648 07/26/23 0602 07/27/23 1003 07/27/23 1227  HGB  --  10.1*   < > 10.3* 9.8*  --   HCT  --  32.1*  --  33.6* 31.7*  --   PLT  --  368  --  371 381  --   LABPROT 20.5*  --   --  19.6* 21.2*  --   INR 1.7*  --   --  1.6* 1.8*  --   CREATININE  --   --   --   --  1.07*  --   TROPONINIHS  --   --   --   --  9 8   < > = values in this interval not displayed.    Estimated Creatinine Clearance: 72 mL/min (A) (by C-G formula based on SCr of 1.07 mg/dL (H)).   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   COPD (chronic obstructive pulmonary disease) (HCC)    inhalers dependent on environment   Depression    Diabetes mellitus    120s usually fasting -  dx more than 10 yrs ago   Dizziness, nonspecific    DVT  (deep venous thrombosis) (HCC)    Recurrent   Fibromyalgia    GERD (gastroesophageal reflux disease)    Heart murmur    History of blood transfusion    History of cocaine abuse (HCC)    Remote history    Hyperlipidemia    Hypertension    takes meds daily   Hypothyroidism    Lupus Anticoagulant Positive    On home oxygen  therapy    at night   Shortness of breath    exertion or lying flat   Sleep apnea    2l of oxygen  at night (as of 12/6, she used to)    Medications:  Scheduled:   potassium chloride   40 mEq Oral BID   sodium chloride  flush  3 mL Intravenous Q12H   Infusions:   sodium chloride      Assessment: 68 yo female with a PMH of 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 Patient was recently hospitalized from 07/22/23-/07/26/23 at [facility] for RLE bruising with a supratherapeutic INR. During admission, patient received 7.5 mg of vitamin K on 5/5.  Patient's home regimen is 10 mg MWF and 7.5 mg all other days. Last dose was yesterday (5/8) and was 7.5 mg.   This mornings INR was 1.8 and CBC was okay - hgb 9.8 and plts 381. CT chest negative for a PE.   [place] of Therapy:  INR 2-3 Monitor platelets by anticoagulation protocol: Yes   Plan:  Warfarin 10 mg x1  Monitor CBC and for signs/symptoms of bleeding   Adaline Ada, PharmD PGY1 Pharmacy Resident 07/27/2023 3:08 PM

## 2023-07-27 NOTE — Plan of Care (Signed)

## 2023-07-27 NOTE — H&P (Signed)
 History and Physical  Pranavi Toalson Kilduff OZH:[id number] DOB: 1955/12/11 DOA: 07/27/2023  PCP: Viola Greulich, MD Patient coming from: home  I have personally briefly reviewed patient's old medical records in [facility]   Chief Complaint: syncope  HPI: Kayleanna Boothby Thor is a 68 y.o. female with past medical history of antiphospholipid syndrome with recurrent DVT on Coumadin , diabetes mellitus type 2, chronic diastolic dysfunction recently discharged from the hospital on 07/26/2023 for supratherapeutic INR with lower extremity bruising was fine at home was going for an appointment, she was sitting on stood up and felt slightly lightheaded walked down the stairs and sat down in home building lobby, most of the history is obtained from the chart bystanders saw her passed out did not hit her head he was mumbling for about 10 minutes she had no loss control stools or urine, she did not bite her tongue. She denies any other prodromal symptoms chest pain shortness of breath cough nausea vomiting or diarrhea she denies any dysuria.   In the ED She was found to have a blood pressure of 90/57 sinus rhythm satting 100%, hypokalemic acute kidney injury (with a baseline creatinine of around 0.6) cardiac biomarkers negative, BNP of 19 hemoglobin of 9 CT of the head showed no acute finding respiratory panel for COVID influenza and SARS-CoV-2 was negative.  CT angio of the chest was negative for PE  Review of Systems: All systems reviewed and apart from history of presenting illness, are negative.  Past Medical History:  Diagnosis Date   Adrenal mass (HCC) 03/226   Benign   Arthritis    knees/multiple orthopedic conditons; lower back   Asthma    per pt   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   COPD (chronic obstructive pulmonary disease) (HCC)    inhalers dependent on environment   Depression    Diabetes mellitus    120s usually fasting -  dx more than 10 yrs ago    Dizziness, nonspecific    DVT (deep venous thrombosis) (HCC)    Recurrent   Fibromyalgia    GERD (gastroesophageal reflux disease)    Heart murmur    History of blood transfusion    History of cocaine abuse (HCC)    Remote history    Hyperlipidemia    Hypertension    takes meds daily   Hypothyroidism    Lupus Anticoagulant Positive    On home oxygen  therapy    at night   Shortness of breath    exertion or lying flat   Sleep apnea    2l of oxygen  at night (as of 12/6, she used to)   Past Surgical History:  Procedure Laterality Date   ABDOMINAL HYSTERECTOMY  1989   Fibroids   ANTERIOR LUMBAR FUSION  01/03/2012   Procedure: ANTERIOR LUMBAR FUSION 1 LEVEL;  Surgeon: Pasty Bongo, MD;  Location: [facility];  Service: Neurosurgery;  Laterality: N/A;  Lumbar Four-Five Anterior Lumbar Interbody Fusion with Instrumentation   BACK SURGERY     cervical spine---disk disease   CARPAL TUNNEL RELEASE     Bilateral   CESAREAN SECTION     CHOLECYSTECTOMY     CYSTOSCOPY/RETROGRADE/URETEROSCOPY/STONE EXTRACTION WITH BASKET Right 04/26/2014   Procedure: CYSTOSCOPY/ RIGHT RETROGRADE/ RIGHT URETEROSCOPY/URETERAL AND RENAL PELVIS BIOPSY;  Surgeon: Osborn Blaze, MD;  Location: [facility];  Service: Urology;  Laterality: Right;   ESOPHAGOGASTRODUODENOSCOPY (EGD) WITH PROPOFOL  N/A 08/25/2022   Procedure: ESOPHAGOGASTRODUODENOSCOPY (EGD) WITH PROPOFOL ;  Surgeon: Alvis Jourdain, MD;  Location: [facility];  Service: Gastroenterology;  Laterality: N/A;   gallstones removed     HEMOSTASIS CLIP PLACEMENT  08/25/2022   Procedure: HEMOSTASIS CLIP PLACEMENT;  Surgeon: Alvis Jourdain, MD;  Location: [facility];  Service: Gastroenterology;;   HOT HEMOSTASIS N/A 08/25/2022   Procedure: HOT HEMOSTASIS (ARGON PLASMA COAGULATION/BICAP);  Surgeon: Alvis Jourdain, MD;  Location: [facility];  Service: Gastroenterology;  Laterality: N/A;   POLYPECTOMY  08/25/2022   Procedure: POLYPECTOMY;  Surgeon: Alvis Jourdain, MD;   Location: [facility];  Service: Gastroenterology;;   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   bilateral   right elbow surgery     RIGHT HEART CATH N/A 02/28/2021   Procedure: RIGHT HEART CATH;  Surgeon: Odie Benne, MD;  Location: [facility];  Service: Cardiovascular;  Laterality: N/A;   THYROID  LOBECTOMY Right 02/27/2017   THYROID  LOBECTOMY Right 02/27/2017   Procedure: RIGHT THYROID  LOBECTOMY;  Surgeon: Sim Dryer, MD;  Location: [facility];  Service: General;  Laterality: Right;   TUBAL LIGATION     Social History:  reports that she has never smoked. She has never used smokeless tobacco. She reports current drug use. Drug: Cocaine. She reports that she does not drink alcohol.   Allergies  Allergen Reactions   Lisinopril  Anaphylaxis, Swelling and Other (See Comments)    THROAT SWELLING, Pt states her throat started to "close up" after being on it for "so long."    Prednisone  Anaphylaxis, Swelling and Other (See Comments)    Throat swelling and tongue irritation   Spironolactone  Other (See Comments)    Caused stroke-like symptoms with the mouth   Corticosteroids Rash    States she developed a rash on her tongue after a steroid injection in her back    Family History  Problem Relation Age of Onset   Hypertension Mother    Diabetes Mother    Cancer Mother        Pancreatic   Hypertension Father    Heart failure Father    Hyperlipidemia Other    COPD Other     Prior to Admission medications   Medication Sig Start Date End Date Taking? Authorizing Provider  ACCU-CHEK GUIDE TEST test strip USE TO TEST BLOOD SUGAR UP TO FOUR TIMES A DAY AS DIRECTED 03/19/23   Viola Greulich, MD  Accu-Chek Softclix Lancets lancets USE TO TEST BLOOD SUGAR UP TO FOUR TIMES A DAY AS DIRECTED 06/22/23   Viola Greulich, MD  albuterol  (PROVENTIL ) (2.5 MG/3ML) 0.083% nebulizer solution USE 1 VIAL IN NEBULIZER EVERY 6 HOURS - and as needed 06/12/23   Viola Greulich, MD  albuterol   (VENTOLIN  HFA) 108 (90 Base) MCG/ACT inhaler INHALE TWO (2) PUFFS BY MOUTH EVERY 4 HOURS AS NEEDED FOR SHORTNESS OF BREATH 02/13/23   Viola Greulich, MD  Blood Glucose Monitoring Suppl (ACCU-CHEK GUIDE) w/Device KIT USE TO TEST BLOOD SUGAR 3 TIMES DAILY 07/09/23   Viola Greulich, MD  canagliflozin  (INVOKANA ) 100 MG TABS tablet Take 1 tablet (100 mg total) by mouth daily before breakfast. 11/10/22   Chandrasekhar, [name] A, MD  cloNIDine  (CATAPRES ) 0.1 MG tablet TAKE ONE (1) TABLET BY MOUTH TWICE DAILY BEFORE BREAKFAST AND AT BEDTIME 03/19/23   Viola Greulich, MD  diclofenac sodium (VOLTAREN) 1 % GEL Apply 2 g topically daily as needed (fibromyalgia pain- affected sites).    [provider]  diltiazem  (CARDIZEM  CD) 240 MG 24 hr capsule TAKE 1 CAPSULE BY MOUTH  DAILY BEFORE BREAKFAST *REFILL REQUEST* 11/10/22   Viola Greulich, MD  DULoxetine  (CYMBALTA ) 30 MG capsule Take 90 mg by mouth at bedtime.    [provider]  FEROSUL 325 (65 Fe) MG tablet TAKE ONE TABLET BY MOUTH ONCE DAILY 07/19/21   Viola Greulich, MD  fexofenadine  (ALLEGRA ) 180 MG tablet Take 1 tablet (180 mg total) by mouth daily. 03/05/23   Viola Greulich, MD  fluticasone  (FLONASE ) 50 MCG/ACT nasal spray INSTILL 2 SPRAYS IN EACH NOSTRIL TWICE DAILY 06/25/23   Viola Greulich, MD  furosemide  (LASIX ) 80 MG tablet TAKE ONE (1) TABLET BY MOUTH TWICE DAILY 01/12/23   Chandrasekhar, [name] A, MD  gabapentin  (NEURONTIN ) 300 MG capsule TAKE ONE (1) CAPSULE BY MOUTH TWICE DAILY 06/13/23   Viola Greulich, MD  Glucose Blood (BLOOD GLUCOSE TEST STRIPS) STRP 1 each by In Vitro route in the morning, at noon, and at bedtime. May substitute to any manufacturer covered by patient's insurance. 06/11/23   Viola Greulich, MD  Incontinence Supply Disposable (PROCARE BARIATRIC BRIEFS) MISC Use as needed. 03/05/23   Viola Greulich, MD  Lancets Misc. MISC May substitute to any manufacturer covered by patient's insurance. 06/11/23   Viola Greulich, MD  lidocaine  (LIDODERM ) 5 % APPLY 1 PATCH TOPICALLY ON THE SKIN DAILY AS NEEDED TO PAINFUL SITE *12 HOURS ON AND 12 HOURS OFF OR AS DIRECTED BY MD* 07/12/23   Viola Greulich, MD  LINZESS  145 MCG CAPS capsule TAKE 1 CAPSULE BY MOUTH EVERY OTHER DAY 02/28/23   Viola Greulich, MD  magic mouthwash (multi-ingredient) oral suspension Swish and spit 5 mLs 4 (four) times daily as needed for mouth pain. 07/18/23   Viola Greulich, MD  metFORMIN  (GLUCOPHAGE ) 500 MG tablet TAKE 1 TABLET BY MOUTH EVERY DAY AT BEDTIME *REFILL REQUEST* 11/10/22   Viola Greulich, MD  methocarbamol  (ROBAXIN ) 500 MG tablet Take 1 tablet (500 mg total) by mouth 2 (two) times daily. Patient taking differently: Take 500 mg by mouth 2 (two) times daily as needed for muscle spasms. 03/22/22   Blue, Soijett A, PA-C  omeprazole  (PRILOSEC) 20 MG capsule Take 1 capsule (20 mg total) by mouth 2 (two) times daily before a meal. 02/13/23   Viola Greulich, MD  potassium chloride  (MICRO-K ) 10 MEQ CR capsule TAKE TWO (2) CAPSULES BY MOUTH EVERY DAY AT BEDTIME 03/19/23   Viola Greulich, MD  QUEtiapine  (SEROQUEL ) 100 MG tablet Take 150 mg by mouth at bedtime. 07/11/23   [provider]  QUEtiapine  (SEROQUEL ) 50 MG tablet Take 50 mg by mouth in the morning and at bedtime. Patient not taking: Reported on 07/23/2023    [provider]  rosuvastatin  (CRESTOR ) 10 MG tablet Take 1 tablet (10 mg total) by mouth daily. 02/02/23 07/23/23  Gloriann Larger A, MD  Semaglutide , 2 MG/DOSE, (OZEMPIC , 2 MG/DOSE,) 8 MG/3ML SOPN Inject 2 mg into the skin once a week. 12/22/22   Viola Greulich, MD  spironolactone  (ALDACTONE ) 25 MG tablet TAKE 1/2 TABLET BY MOUTH ONCE DAILY Patient not taking: Reported on 07/23/2023 10/27/22   Jann Melody, MD  SYMBICORT  160-4.5 MCG/ACT inhaler INHALE TWO (2) PUFFS BY MOUTH TWICE DAILY 03/19/23   Viola Greulich, MD  Vitamin D , Ergocalciferol , (DRISDOL ) 1.25 MG (50000 UNIT) CAPS capsule  Take 1 capsule (50,000 Units total) by mouth every 7 (seven) days. Patient not taking: Reported on 07/23/2023 07/13/23   Viola Greulich, MD  warfarin (  COUMADIN ) 10 MG tablet Take daily as directed by MD 01/12/23   Sumner Ends, MD  warfarin (COUMADIN ) 7.5 MG tablet TAKE 1 TABLET BY MOUTH DAILY 02/12/23   Sumner Ends, MD  zonisamide  (ZONEGRAN ) 100 MG capsule TAKE 1 CAPSULE BY MOUTH EACH MORNING AND TAKE 2 CAPSULES IN THE EVENING 04/19/23   Viola Greulich, MD   Physical Exam: Vitals:   07/27/23 1018 07/27/23 1115 07/27/23 1349 07/27/23 1400  BP:  117/70  (!) 144/88  Pulse:  79  74  Resp:  16  19  Temp:   97.8 F (36.6 C)   TempSrc:   Oral   SpO2:  100%  100%  Weight: 134 kg     Height: 5\' 7"  (1.702 m)       General exam: Moderately built and nourished patient, lying comfortably supine on the gurney in no obvious distress. Head, eyes and ENT: Nontraumatic and normocephalic.  Neck: Supple. No JVD, carotid bruit or thyromegaly. Lymphatics: No lymphadenopathy. Respiratory system: Clear to auscultation. No increased work of breathing. Cardiovascular system: S1 and S2 heard, RRR. No JVD, murmurs, gallops, clicks or pedal edema.. Gastrointestinal system: Abdomen is nondistended, soft and nontender. Normal bowel sounds heard. No organomegaly or masses appreciated. Central nervous system: Alert and oriented. No focal neurological deficits. Extremities: Symmetric 5 x 5 power. Peripheral pulses symmetrically felt.  Skin: No rashes or acute findings. Musculoskeletal system: Negative exam. Psychiatry: Pleasant and cooperative.   Labs on Admission:  Basic Metabolic Panel: Recent Labs  Lab 07/22/23 2102 07/23/23 0521 07/27/23 1003  NA 139 141 137  K 4.0 4.0 3.4*  CL 102 106 103  CO2 26 24 23   GLUCOSE 133* 120* 219*  BUN 14 15 20   CREATININE 0.84 0.66 1.07*  CALCIUM  7.7* 7.9* 8.6*  MG  --  1.7  --    Liver Function Tests: Recent Labs  Lab 07/22/23 2102 07/23/23 0521  07/27/23 1003  AST 19 17 20   ALT 19 18 18   ALKPHOS 76 77 68  BILITOT 0.3 0.4 0.5  PROT 8.2* 8.0 7.3  ALBUMIN  3.6 3.7 3.2*   No results for input(s): "LIPASE", "AMYLASE" in the last 168 hours. No results for input(s): "AMMONIA" in the last 168 hours. CBC: Recent Labs  Lab 07/22/23 2102 07/23/23 0521 07/23/23 0935 07/23/23 1708 07/24/23 0553 07/25/23 0648 07/26/23 0602 07/27/23 1003  WBC 8.0 7.3  --   --  9.7 7.5 6.7 6.5  NEUTROABS 4.6 4.0  --   --   --   --   --  3.5  HGB 10.7* 10.5*   < > 10.4* 10.3* 10.1* 10.3* 9.8*  HCT 33.7* 33.8*   < > 33.9* 33.9* 32.1* 33.6* 31.7*  MCV 83.4 84.7  --   --  86.0 84.5 86.2 85.4  PLT 380 356  --   --  374 368 371 381   < > = values in this interval not displayed.   Cardiac Enzymes: Recent Labs  Lab 07/22/23 2102  CKTOTAL 201    BNP (last 3 results) Recent Labs    07/11/23 1050  PROBNP 18.0   CBG: Recent Labs  Lab 07/25/23 1618 07/25/23 2136 07/26/23 0728 07/26/23 1154 07/27/23 1009  GLUCAP 151* 141* 121* 93 210*    Radiological Exams on Admission: CT Angio Chest PE W and/or Wo Contrast Result Date: 07/27/2023 CLINICAL DATA:  Pulmonary embolism suspected EXAM: CT ANGIOGRAPHY CHEST WITH CONTRAST TECHNIQUE: Multidetector CT imaging of the chest was performed  using the standard protocol during bolus administration of intravenous contrast. Multiplanar CT image reconstructions and MIPs were obtained to evaluate the vascular anatomy. Multiplanar image (3D post-processing) reconstructions and MIPs were obtained to evaluate the vascular anatomy. RADIATION DOSE REDUCTION: This exam was performed according to the departmental dose-optimization program which includes automated exposure control, adjustment of the mA and/or kV according to patient size and/or use of iterative reconstruction technique. CONTRAST:  75mL OMNIPAQUE  IOHEXOL  350 MG/ML SOLN COMPARISON:  CT of the heart performed May 07, 2020 FINDINGS: Cardiovascular: Enlarged  heart. No thoracic aortic aneurysm. Two vessel aortic arch. The descending thoracic aorta is grossly patent and unremarkable. Main pulmonary artery is within normal limits for diameter. There is no evidence of pulmonary embolism to the segmental level. Mediastinum/Nodes: No evidence of mediastinal lymphadenopathy. Mildly prominent axillary lymph nodes which may be reactive. Lungs/Pleura: A 5 mm noncalcified right lower lobe pulmonary nodule is present and unchanged compared to August 27, 2022. No pleural effusion or pneumothorax. Upper Abdomen: Cholecystectomy. Unchanged right adrenal nodule measuring 3.1 cm. Small hiatal hernia. Musculoskeletal: Degenerative changes in the imaged osseous structures. Review of the MIP images confirms the above findings. IMPRESSION: 1. No evidence of pulmonary embolism to the segmental level. Electronically Signed   By: Reagan Camera M.D.   On: 07/27/2023 13:38   CT Head Wo Contrast Result Date: 07/27/2023 CLINICAL DATA:  Mental status change, unknown cause, near syncope EXAM: CT HEAD WITHOUT CONTRAST TECHNIQUE: Contiguous axial images were obtained from the base of the skull through the vertex without intravenous contrast. RADIATION DOSE REDUCTION: This exam was performed according to the departmental dose-optimization program which includes automated exposure control, adjustment of the mA and/or kV according to patient size and/or use of iterative reconstruction technique. COMPARISON:  August 23, 2022 FINDINGS: Brain: The ventricles appear age appropriate. No mass effect or midline shift. Gray-white differentiation is preserved. Scattered periventricular white matter hypoattenuation, most consistent with changes of mild chronic ischemic microvascular disease. 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 No evidence of acute territorial infarction, extra-axial fluid collection, hemorrhage, or mass lesion. The sella and pituitary are within normal limits. The basilar cisterns  are patent without downward herniation. The cerebellar hemispheres and vermis are well formed without mass lesion or focal attenuation abnormality. Vascular: No hyperdense vessel. Calcified atherosclerotic plaque within the cavernous/supraclinoid internal carotid arteries. Skull: Normal. Negative for fracture or focal lesion. Sinuses/Orbits: The paranasal sinuses and mastoids are clear. The globes appear intact. No retrobulbar hematoma. Bilateral lens replacements. Other: None. IMPRESSION: 1. No acute intracranial abnormality, specifically, no acute hemorrhage, territorial infarction, or intracranial mass. 2. Sequelae of mild, chronic ischemic microvascular disease with chronic lacunar infarct in the left basal ganglia, unchanged. Electronically Signed   By: Rance Burrows M.D.   On: 07/27/2023 13:14   DG Chest Portable 1 View Result Date: 07/27/2023 CLINICAL DATA:  Altered mental status. EXAM: PORTABLE CHEST 1 VIEW COMPARISON:  07/22/2023. FINDINGS: Low lung volume. Bilateral lung fields are clear. Bilateral costophrenic angles are clear. Elevated right hemidiaphragm noted. Stable mildly enlarged cardio-mediastinal silhouette. No acute osseous abnormalities. Lower cervical spinal fixation hardware noted. The soft tissues are within normal limits. IMPRESSION: *No active disease. Electronically Signed   By: Beula Brunswick M.D.   On: 07/27/2023 10:28   VAS US  LOWER EXTREMITY VENOUS (DVT) Result Date: 07/25/2023  Lower Venous DVT Study Patient Name:  ENOLA HALT Mikula  Date of Exam:   07/25/2023 Medical Rec #: [id number]  Accession #:    [id number] Date of Birth: 11/07/1955            Patient Gender: F Patient Age:   40 years Exam Location:  [facility] Procedure:      VAS US  LOWER EXTREMITY VENOUS (DVT) Referring Phys: Aisha Hove --------------------------------------------------------------------------------  Indications: Pain.  Risk Factors: DVT Hx obesity. Limitations: Body habitus.  Comparison Study: No significant changes seen since previous exam 04/25/21. Performing Technologist: Estanislao Heimlich  Examination Guidelines: A complete evaluation includes B-mode imaging, spectral Doppler, color Doppler, and power Doppler as needed of all accessible portions of each vessel. Bilateral testing is considered an integral part of a complete examination. Limited examinations for reoccurring indications may be performed as noted. The reflux portion of the exam is performed with the patient in reverse Trendelenburg.  +---------+---------------+---------+-----------+----------+--------------+ RIGHT    CompressibilityPhasicitySpontaneityPropertiesThrombus Aging +---------+---------------+---------+-----------+----------+--------------+ CFV      Full           Yes      Yes                                 +---------+---------------+---------+-----------+----------+--------------+ SFJ      Full                                                        +---------+---------------+---------+-----------+----------+--------------+ FV Prox  Full                                                        +---------+---------------+---------+-----------+----------+--------------+ FV Mid   Full                                                        +---------+---------------+---------+-----------+----------+--------------+ FV DistalFull                                                        +---------+---------------+---------+-----------+----------+--------------+ PFV      Full                                                        +---------+---------------+---------+-----------+----------+--------------+ POP      Full           Yes      Yes                                 +---------+---------------+---------+-----------+----------+--------------+ PTV      Full                    Yes                                  +---------+---------------+---------+-----------+----------+--------------+  PERO     Full                    Yes                                 +---------+---------------+---------+-----------+----------+--------------+   +---------+---------------+---------+-----------+----------+--------------+ LEFT     CompressibilityPhasicitySpontaneityPropertiesThrombus Aging +---------+---------------+---------+-----------+----------+--------------+ CFV      Full           Yes      Yes                                 +---------+---------------+---------+-----------+----------+--------------+ SFJ      Full                                                        +---------+---------------+---------+-----------+----------+--------------+ FV Prox  Full                                                        +---------+---------------+---------+-----------+----------+--------------+ FV Mid   Full                                                        +---------+---------------+---------+-----------+----------+--------------+ FV DistalFull                                                        +---------+---------------+---------+-----------+----------+--------------+ PFV      Full                                                        +---------+---------------+---------+-----------+----------+--------------+ POP      Full           Yes      Yes                                 +---------+---------------+---------+-----------+----------+--------------+ PTV      Full                    Yes                                 +---------+---------------+---------+-----------+----------+--------------+ PERO     Full                    Yes                                 +---------+---------------+---------+-----------+----------+--------------+  Summary: BILATERAL: - No evidence of deep vein thrombosis seen in the lower extremities, bilaterally. -No evidence of  popliteal cyst, bilaterally.   *See table(s) above for measurements and observations. Electronically signed by Genny Kid MD on 07/25/2023 at 5:30:40 PM.    Final     EKG: Independently reviewed.  Sinus rhythm normal axis no T wave abnormalities.  Assessment/Plan Syncope: She relates some lightheadedness when she stood up this morning, we will go ahead and check orthostatics hold antihypertensive medication diltiazem , clonidine  Lasix  and Aldactone . Will monitor on telemetry, check a 2D echo cycle cardiac biomarkers. Check an MRI of the brain. Check urinary sodium and urinary creatinine. Start her on IV fluids.  Strict I's and O's and daily weights. She has a history of seizures again EEG resume antiseizure medication.  Acute kidney injury: Most likely hemodynamically mediated, her baseline creatinine is around 0.6, on admission was 1.1. Hold Lasix  and Aldactone  started on IV fluids recheck basic metabolic panel in the morning.  Hypokalemia: Replete orally recheck in the morning, check magnesium  level. Try to keep potassium greater than 4 magnesium  greater than 2.  Essential hypertension: Hold all antihypertensive medication diltiazem  Lasix  clonidine  and Aldactone . Her blood pressure now is 144/88 we will continue to monitor closely.  Morbid obesity with BMI of 45.0-49.9, adult [facility]) Noted she has been counseled.  Constipation: Started on MiraLAX  p.o. twice daily  Diabetes type 2, controlled (HCC) Hold Invokana , metformin  and Ozempic . Will start on sliding scale insulin . Check an A1c.  Chronic anticoagulation/ Lupus anticoagulant positive Start on Coumadin  per pharmacy INR 1.8.  Chronic diastolic CHF (congestive heart failure) (HCC) Hold Lasix  Aldactone  and diltiazem .    DVT Prophylaxis: COUMADIN  Code Status: full  Family Communication: daughter  Disposition Plan: observation     Macdonald Savoy MD [facility]   07/27/2023, 2:37 PM

## 2023-07-28 ENCOUNTER — Observation Stay (HOSPITAL_BASED_OUTPATIENT_CLINIC_OR_DEPARTMENT_OTHER)

## 2023-07-28 DIAGNOSIS — I5032 Chronic diastolic (congestive) heart failure: Secondary | ICD-10-CM

## 2023-07-28 DIAGNOSIS — R55 Syncope and collapse: Secondary | ICD-10-CM | POA: Diagnosis not present

## 2023-07-28 DIAGNOSIS — Z7901 Long term (current) use of anticoagulants: Secondary | ICD-10-CM | POA: Diagnosis not present

## 2023-07-28 DIAGNOSIS — R760 Raised antibody titer: Secondary | ICD-10-CM | POA: Diagnosis not present

## 2023-07-28 DIAGNOSIS — E876 Hypokalemia: Secondary | ICD-10-CM | POA: Diagnosis not present

## 2023-07-28 DIAGNOSIS — E1142 Type 2 diabetes mellitus with diabetic polyneuropathy: Secondary | ICD-10-CM

## 2023-07-28 LAB — CBC
HCT: 28.5 % — ABNORMAL LOW (ref 36.0–46.0)
HCT: 30.4 % — ABNORMAL LOW (ref 36.0–46.0)
Hemoglobin: 8.9 g/dL — ABNORMAL LOW (ref 12.0–15.0)
Hemoglobin: 9.4 g/dL — ABNORMAL LOW (ref 12.0–15.0)
MCH: 26.3 pg (ref 26.0–34.0)
MCH: 26 pg (ref 26.0–34.0)
MCHC: 31.2 g/dL (ref 30.0–36.0)
MCHC: 30.9 g/dL (ref 30.0–36.0)
MCV: 84.3 fL (ref 80.0–100.0)
MCV: 84.2 fL (ref 80.0–100.0)
Platelets: 362 10*3/uL (ref 150–400)
Platelets: 364 10*3/uL (ref 150–400)
RBC: 3.38 MIL/uL — ABNORMAL LOW (ref 3.87–5.11)
RBC: 3.61 MIL/uL — ABNORMAL LOW (ref 3.87–5.11)
RDW: 20.1 % — ABNORMAL HIGH (ref 11.5–15.5)
RDW: 20.2 % — ABNORMAL HIGH (ref 11.5–15.5)
WBC: 7.9 10*3/uL (ref 4.0–10.5)
WBC: 7.3 10*3/uL (ref 4.0–10.5)
nRBC: 0.3 % — ABNORMAL HIGH (ref 0.0–0.2)
nRBC: 0 % (ref 0.0–0.2)

## 2023-07-28 LAB — BASIC METABOLIC PANEL WITH GFR
Anion gap: 10 (ref 5–15)
BUN: 17 mg/dL (ref 8–23)
CO2: 21 mmol/L — ABNORMAL LOW (ref 22–32)
Calcium: 8.8 mg/dL — ABNORMAL LOW (ref 8.9–10.3)
Chloride: 107 mmol/L (ref 98–111)
Creatinine, Ser: 0.85 mg/dL (ref 0.44–1.00)
GFR, Estimated: >60 mL/min (ref >60)
Glucose, Bld: 134 mg/dL — ABNORMAL HIGH (ref 70–99)
Potassium: 4.1 mmol/L (ref 3.5–5.1)
Sodium: 138 mmol/L (ref 135–145)

## 2023-07-28 LAB — HEMOGLOBIN A1C
Hgb A1c MFr Bld: 6.5 % — ABNORMAL HIGH (ref 4.8–5.6)
Mean Plasma Glucose: 139.85 mg/dL

## 2023-07-28 LAB — ECHOCARDIOGRAM COMPLETE
AR max vel: 2.2 cm2
AV Peak grad: 14.4 mmHg
Ao pk vel: 1.9 m/s
Area-P 1/2: 4.06 cm2
Height: 64 in
S' Lateral: 3.3 cm
Weight: 4726.66 [oz_av]

## 2023-07-28 LAB — PROTIME-INR
INR: 2 — ABNORMAL HIGH (ref 0.8–1.2)
Prothrombin Time: 22.5 s — ABNORMAL HIGH (ref 11.4–15.2)

## 2023-07-28 LAB — GLUCOSE, CAPILLARY
Glucose-Capillary: 112 mg/dL — ABNORMAL HIGH (ref 70–99)
Glucose-Capillary: 109 mg/dL — ABNORMAL HIGH (ref 70–99)
Glucose-Capillary: 112 mg/dL — ABNORMAL HIGH (ref 70–99)
Glucose-Capillary: 127 mg/dL — ABNORMAL HIGH (ref 70–99)

## 2023-07-28 LAB — VITAMIN B12: Vitamin B-12: 275 pg/mL (ref 180–914)

## 2023-07-28 LAB — IRON AND TIBC
Iron: 43 ug/dL (ref 28–170)
Saturation Ratios: 13 % (ref 10.4–31.8)
TIBC: 322 ug/dL (ref 250–450)
UIBC: 279 ug/dL

## 2023-07-28 LAB — FOLATE: Folate: 17.5 ng/mL (ref >5.9)

## 2023-07-28 LAB — RETICULOCYTES
Immature Retic Fract: 31.3 % — ABNORMAL HIGH (ref 2.3–15.9)
RBC.: 3.63 MIL/uL — ABNORMAL LOW (ref 3.87–5.11)
Retic Count, Absolute: 102.7 10*3/uL (ref 19.0–186.0)
Retic Ct Pct: 2.8 % (ref 0.4–3.1)

## 2023-07-28 LAB — FERRITIN: Ferritin: 62 ng/mL (ref 11–307)

## 2023-07-28 MED ORDER — MAGNESIUM OXIDE -MG SUPPLEMENT 400 (240 MG) MG PO TABS
400.0000 mg | ORAL_TABLET | Freq: Two times a day (BID) | ORAL | Status: AC
  Administered 2023-07-28 (×2): 400 mg via ORAL
  Filled 2023-07-28 (×2): qty 1

## 2023-07-28 MED ORDER — WARFARIN SODIUM 7.5 MG PO TABS
7.5000 mg | ORAL_TABLET | Freq: Once | ORAL | Status: AC
  Administered 2023-07-28: 7.5 mg via ORAL
  Filled 2023-07-28: qty 1

## 2023-07-28 MED ORDER — DICLOFENAC SODIUM 1 % EX GEL
2.0000 g | Freq: Four times a day (QID) | CUTANEOUS | Status: DC | PRN
  Administered 2023-07-28: 2 g via TOPICAL
  Filled 2023-07-28: qty 100

## 2023-07-28 MED ORDER — DILTIAZEM HCL ER COATED BEADS 240 MG PO CP24
240.0000 mg | ORAL_CAPSULE | Freq: Every day | ORAL | Status: DC
  Administered 2023-07-28 – 2023-07-30 (×3): 240 mg via ORAL
  Filled 2023-07-28 (×3): qty 1

## 2023-07-28 MED ORDER — TRAMADOL HCL 50 MG PO TABS
50.0000 mg | ORAL_TABLET | Freq: Once | ORAL | Status: AC | PRN
  Administered 2023-07-28: 50 mg via ORAL
  Filled 2023-07-28: qty 1

## 2023-07-28 NOTE — Evaluation (Addendum)
 Occupational Therapy Evaluation Patient Details Name: [name] MRN: [id number] DOB: 1955-07-29 Today's Date: 07/28/2023   History of Present Illness   [name] is a 68 y.o. female who presented 07/27/23 following syncopal episode at home. She stood up, felt slightly lightheaded, began to walk down the stairs, sat down and passed out. Per bystanders, did not hit her head but was mumbling for ~56mins. Found to have hypokalemic acute kidney injury. 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 Of note, recently d/c'd 5/8 for supratherapeutic INR with RLE pain and bruising.     Clinical Impressions Pt presents with decline in function and safety with ADLs and ADL mobility with impaired strength, balance, endurance; limited by B LEs pain. Pt recently d/c from [facility] 07/26/23. PTA pt lives alone and was Ind with grooming, feeding, dressing, and toileting. PCA assists with bathing and cleaning. Family provides transportation and help with meals. Pt used rollator for mobility. Pt reports 3 falls in the last 6 months, 2 from syncope and the other from a LOB due to knee pain. Pt currently requires mod A with LB ADLs, mod A with toileting, min A/CGA with mobility using RW.  During session, pt transferred to [facility] but when it was time to stand up from [facility], she had to immediately sit back down due to c/o sharp, shooting pain in B LEs. Required standing 2 more times before she was able to get back in bed. RN notified and PT made aware. Pt reports feeling dizzy/lightheaded earlier when walking to bathroom with mobility tech. Pt would benefit from acute OT services to address impairments to maximize level of function and safety BPs: Supine 143/60, Sitting EOB 135/74 and Standing 148/94     If plan is discharge home, recommend the following:   A lot of help with bathing/dressing/bathroom;A little help with walking and/or  transfers;Assistance with cooking/housework;Assist for transportation;Help with stairs or ramp for entrance     Functional Status Assessment   Patient has had a recent decline in their functional status and demonstrates the ability to make significant improvements in function in a reasonable and predictable amount of time.     Equipment Recommendations   Other (comment) (reacher, LH bath sponge, sock aid)     Recommendations for Other Services   PT consult     Precautions/Restrictions   Precautions Precautions: Fall;Other (comment) Recall of Precautions/Restrictions: Intact Precaution/Restrictions Comments: B LE pain Restrictions Weight Bearing Restrictions Per Provider Order: No Watch BP, syncope    Mobility Bed Mobility Overal bed mobility: Needs Assistance Bed Mobility: Supine to Sit, Sit to Supine     Supine to sit: Contact guard, HOB elevated Sit to supine: Min assist   General bed mobility comments: extra time to sit EOB, min A with LEs into bed    Transfers Overall transfer level: Needs assistance Equipment used: Rolling walker (2 wheels) Transfers: Sit to/from Stand Sit to Stand: Min assist, Contact guard assist           General transfer comment: Min A and extra time to stand from bed, CGA to stand from [facility], required 3 times to stand from [facility] due to B LE pain before pt able to get back into bed      Balance Overall balance assessment: History of Falls, Needs assistance Sitting-balance support: No upper extremity supported, Feet supported Sitting balance-Leahy Scale: Good     Standing balance support: Bilateral upper extremity supported, During functional  activity, Reliant on assistive device for balance Standing balance-Leahy Scale: Poor                             ADL either performed or assessed with clinical judgement   ADL Overall ADL's : Needs assistance/impaired Eating/Feeding: Independent   Grooming: Wash/dry  hands;Wash/dry face;Set up;Sitting   Upper Body Bathing: Set up;Sitting   Lower Body Bathing: Moderate assistance   Upper Body Dressing : Set up;Sitting   Lower Body Dressing: Moderate assistance   Toilet Transfer: Contact guard assist;Ambulation;Rolling walker (2 wheels);Grab bars;Cueing for safety   Toileting- Clothing Manipulation and Hygiene: Moderate assistance;Sit to/from stand       Functional mobility during ADLs: Minimal assistance;Contact guard assist General ADL Comments: BPs: Supine 098/11, Sitting EOB 135/74 and Standing 148/94. She transferred to [facility] but when it was time to stand up from [facility], she had to immediately sit back down due to c/o sharp, shooting pain in B LEs. Attempted standing 2 more times before she was able to get back in bed.     Vision         Perception         Praxis         Pertinent Vitals/Pain Pain Assessment Pain Assessment: 0-10 Pain Score: 8  Pain Location: B LEs.BPs: Pt  transferred to [facility] but when it was time to stand up from [facility], she had to immediately sit back down due to c/o sharp, shooting pain in B LEs. Attempted standing 2 more times before she was able to get back in bed. Pain Descriptors / Indicators: Sharp, Shooting, Discomfort Pain Intervention(s): Limited activity within patient's tolerance, Monitored during session, Repositioned     Extremity/Trunk Assessment Upper Extremity Assessment Upper Extremity Assessment: Defer to OT evaluation           Communication Communication Communication: No apparent difficulties   Cognition                                             Cueing  General Comments       During session, pt transferred to [facility] but when it was time to stand up from [facility], she had to immediately sit back down due to c/o sharp, shooting pain in B LEs. Required standing 2 more times before she was able to get back in bed.    Exercises     Shoulder Instructions      Home Living  Family/patient expects to be discharged to:: Private residence Living Arrangements: Alone Available Help at Discharge: Personal care attendant;Available PRN/intermittently;Friend(s) Type of Home: Apartment Home Access: Level entry;Elevator     Home Layout: One level     Bathroom Shower/Tub: Chief Strategy Officer: Handicapped height     Home Equipment: Educational psychologist (4 wheels);BSC/3in1;Cane - single point;Grab bars - tub/shower;Grab bars - toilet   Additional Comments: Pt reports having a PCA Monday-Friday 12:15-3:15pm, Saturday 8-10am, Sunday 8-9:30am who assists with bathing & cleaning.      Prior Functioning/Environment Prior Level of Function : Needs assist       Physical Assist : ADLs (physical)   ADLs (physical): Bathing;IADLs Mobility Comments: Ambulates with rollator. reports 3  falls in the last 6 months, 2 from syncope and the other from a LOB due to knee pain ADLs Comments: Ind with  grooming, feeding, dressing, and toileting. PCA assists with bathing and cleaning. Family provides transportation and help with meals.    OT Problem List: Decreased strength;Impaired balance (sitting and/or standing);Decreased knowledge of use of DME or AE;Decreased activity tolerance   OT Treatment/Interventions: Self-care/ADL training;Therapeutic exercise;Balance training;Energy conservation;DME and/or AE instruction;Therapeutic activities      OT Goals(Current goals can be found in the care plan section)   Acute Rehab OT Goals Patient Stated Caicedo: feel better, go home OT [place]ham Formulation: With patient Time For M[place]one Achievement: 08/11/23 Potential to Achieve Goals: Good ADL Goals Pt Will Perform Grooming: with contact guard assist;with supervision;standing Pt Will Perform Lower Body Bathing: with min assist;with adaptive equipment Pt Will Perform Lower Body Dressing: with min assist;with adaptive equipment Pt Will Transfer to Toilet: with contact guard  assist;with supervision;ambulating;regular height toilet Pt Will Perform Toileting - Clothing Manipulation and hygiene: with min assist;with contact guard assist;sit to/from stand   OT Frequency:  Min 2X/week    Co-evaluation              AM-PAC OT "6 Clicks" Daily Activity     Outcome Measure Help from another person eating meals?: None Help from another person taking care of personal grooming?: A Little Help from another person toileting, which includes using toliet, bedpan, or urinal?: A Little Help from another person bathing (including washing, rinsing, drying)?: A Lot Help from another person to put on and taking off regular upper body clothing?: A Little Help from another person to put on and taking off regular lower body clothing?: A Lot 6 Click Score: 17   End of Session Equipment Utilized During Treatment: Gait belt;Rolling walker (2 wheels);Other (comment) [facility]) Nurse Communication: Mobility status;Other (comment) (BPs and pain)  Activity Tolerance: Patient limited by fatigue;Patient limited by pain Patient left: in bed;with call bell/phone within reach;Other (comment) (PT in to see pt)  OT Visit Diagnosis: Unsteadiness on feet (R26.81);Muscle weakness (generalized) (M62.81)                Time: 8119-1478 OT Time Calculation (min): 34 min Charges:  OT General Charges $OT Visit: 1 Visit OT Evaluation $OT Eval Moderate Complexity: 1 Mod OT Treatments $Therapeutic Activity: 8-22 mins    [name] 07/28/2023, 12:16 PM

## 2023-07-28 NOTE — Evaluation (Signed)
 Physical Therapy Evaluation Patient Details Name: [name] MRN: [id number] DOB: 06/10/1955 Today's Date: 07/28/2023  History of Present Illness  [name] is a 68 y.o. female who presented 07/27/23 following syncopal episode at home. She stood up, felt slightly lightheaded, began to walk down the stairs, sat down and passed out. Per bystanders, did not hit her head but was mumbling for ~65mins. Found to have hypokalemic acute kidney injury. 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 Of note, recently d/c'd 5/8 for supratherapeutic INR with RLE pain and bruising.   Clinical Impression  Pt admitted with above diagnosis. PTA, pt was modI for functional mobility using a rollator, modI for most ADLs, and received assistance with bathing and IADLs. She lives alone in a sky rise apartment with elevator access and has a PCA daily for several hours. Pt currently with functional limitations due to the deficits listed below (see PT Problem List). She required CGA for OOB mobility using RW. Pt will benefit from acute skilled PT to increase her independence and safety with mobility to allow discharge home. Recommend HHPT to address deconditioning, reduced BLE pain, improve cardiopulmonary endurance, and decrease fall risk.      If plan is discharge home, recommend the following: A little help with walking and/or transfers;A little help with bathing/dressing/bathroom;Assistance with cooking/housework;Assist for transportation;Help with stairs or ramp for entrance   Can travel by private vehicle        Equipment Recommendations None recommended by PT (Pt already has DME)  Recommendations for Other Services       Functional Status Assessment Patient has had a recent decline in their functional status and demonstrates the ability to make significant improvements in function in a reasonable and predictable amount of time.      Precautions / Restrictions Precautions Precautions: Fall Recall of Precautions/Restrictions: Intact Restrictions Weight Bearing Restrictions Per Provider Order: No      Mobility  Bed Mobility Overal bed mobility: Needs Assistance Bed Mobility: Supine to Sit     Supine to sit: HOB elevated, Supervision Sit to supine: HOB elevated, Supervision   General bed mobility comments: Pt reports sleeping with 6 pillows, so kept HOB elevated equivalent to her home set up.    Transfers Overall transfer level: Needs assistance Equipment used: Rolling walker (2 wheels) Transfers: Sit to/from Stand Sit to Stand: Contact guard assist           General transfer comment: Pt stood from lowest bed height. She demonstrated proper hand placement using RW. Good eccentric control with sitting.    Ambulation/Gait Ambulation/Gait assistance: Contact guard assist Gait Distance (Feet): 25 Feet Assistive device: Rolling walker (2 wheels) Gait Pattern/deviations: Step-through pattern, Decreased stride length, Knee flexed in stance - right, Knee flexed in stance - left, Trunk flexed Gait velocity: decreased Gait velocity interpretation: <1.31 ft/sec, indicative of household ambulator   General Gait Details: Pt ambulated with short slow steps. She had difficulty advancing BLE secondary to pain. Pt maintain fwd flex posture. She stayed within RW at all times and navigated the room well.  Stairs            Wheelchair Mobility     Tilt Bed    Modified Rankin (Stroke Patients Only)       Balance Overall balance assessment: History of Falls, Mild deficits observed, not formally tested  Pertinent Vitals/Pain Pain Assessment Pain Assessment: 0-10 Pain Score: 8  Pain Location: BLE Pain Descriptors / Indicators: Sharp, Shooting, Discomfort Pain Intervention(s): Monitored during session, Limited activity within patient's  tolerance, Repositioned    Home Living Family/patient expects to be discharged to:: Private residence Living Arrangements: Alone Available Help at Discharge: Personal care attendant;Available PRN/intermittently;Friend(s) Type of Home: Apartment Home Access: Level entry;Elevator       Home Layout: One level Home Equipment: Educational psychologist (4 wheels);BSC/3in1;Cane - single point;Grab bars - tub/shower;Grab bars - toilet Additional Comments: Pt reports having a PCA Monday-Friday 12:15-3:15pm, Saturday 8-10am, Sunday 8-9:30am who assists with bathing & cleaning.    Prior Function Prior Level of Function : Needs assist       Physical Assist : ADLs (physical)   ADLs (physical): Bathing;IADLs Mobility Comments: Ambulates with rollator. reports 3  falls in the last 6 months, 2 from syncope and the other from a LOB due to knee pain ADLs Comments: Ind with grooming, feeding, dressing, and toileting. PCA assists with bathing and cleaning. Family provides transportation and help with meals.     Extremity/Trunk Assessment   Upper Extremity Assessment Upper Extremity Assessment: Defer to OT evaluation    Lower Extremity Assessment Lower Extremity Assessment: Generalized weakness;RLE deficits/detail;LLE deficits/detail RLE: Unable to fully assess due to pain RLE Sensation: decreased light touch (laterally) RLE Coordination: decreased gross motor LLE: Unable to fully assess due to pain LLE Sensation: decreased light touch (laterally) LLE Coordination: decreased gross motor    Cervical / Trunk Assessment Cervical / Trunk Assessment: Other exceptions Cervical / Trunk Exceptions: Body Habitus  Communication   Communication Communication: No apparent difficulties    Cognition Arousal: Alert Behavior During Therapy: WFL for tasks assessed/performed   PT - Cognitive impairments: No apparent impairments                       PT - Cognition Comments: Pt A,Ox4 Following  commands: Intact       Cueing Cueing Techniques: Verbal cues     General Comments General comments (skin integrity, edema, etc.): VSS on RA    Exercises     Assessment/Plan    PT Assessment Patient needs continued PT services  PT Problem List Decreased strength;Decreased activity tolerance;Decreased mobility;Decreased balance;Decreased knowledge of precautions;Pain       PT Treatment Interventions DME instruction;Therapeutic activities;Gait training;Functional mobility training;Patient/family education;Therapeutic exercise;Balance training    PT Goals (Current goals can be found in the Care Plan section)  Acute Rehab PT Goals Patient Stated Hendler: Return Home PT Pense Formulation: With patient Time For Weaver Achievement: 08/11/23 Potential to Achieve Goals: Good    Frequency Min 2X/week     Co-evaluation               AM-PAC PT "6 Clicks" Mobility  Outcome Measure Help needed turning from your back to your side while in a flat bed without using bedrails?: A Little Help needed moving from lying on your back to sitting on the side of a flat bed without using bedrails?: A Little Help needed moving to and from a bed to a chair (including a wheelchair)?: A Little Help needed standing up from a chair using your arms (e.g., wheelchair or bedside chair)?: A Little Help needed to walk in hospital room?: A Little Help needed climbing 3-5 steps with a railing? : A Lot 6 Click Score: 17    End of Session Equipment Utilized During Treatment: Gait belt Activity Tolerance: Patient tolerated  treatment well;Patient limited by fatigue Patient left: in bed;with call bell/phone within reach;with family/visitor present Nurse Communication: Mobility status PT Visit Diagnosis: Unsteadiness on feet (R26.81);Muscle weakness (generalized) (M62.81);Pain Pain - Right/Left: Left Pain - part of body: Knee    Time: 1610-9604 PT Time Calculation (min) (ACUTE ONLY): 27 min   Charges:   PT  Evaluation $PT Eval Moderate Complexity: 1 Mod             Glenford Lanes, PT, DPT Acute Rehabilitation Services Office: [phone] Secure Chat Preferred  Riva Chester 07/28/2023, 1:13 PM

## 2023-07-28 NOTE — Care Management Obs Status (Signed)
 MEDICARE OBSERVATION STATUS NOTIFICATION   Patient Details  Name: [name] MRN: [id number] Date of Birth: Jul 26, 1955   Medicare Observation Status Notification Given:  Yes    Fouad Taul G., RN 07/28/2023, 9:29 AM

## 2023-07-28 NOTE — Progress Notes (Signed)
 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, recurrent DVTs)  Allergies  Allergen Reactions   Lisinopril  Anaphylaxis, Swelling and Other (See Comments)    THROAT SWELLING, Pt states her throat started to "close up" after being on it for "so long."    Prednisone  Anaphylaxis, Swelling and Other (See Comments)    Throat swelling and tongue irritation   Spironolactone  Other (See Comments)    Caused stroke-like symptoms with the mouth   Corticosteroids Rash    States she developed a rash on her tongue after a steroid injection in her back    Patient Measurements: Height: 5\' 4"  (162.6 cm) Weight: 134 kg (295 lb 6.7 oz) IBW/kg (Calculated) : 54.7 HEPARIN  DW (KG): 94.1  Vital Signs: Temp: 99.6 F (37.6 C) (05/10 0727) Temp Source: Axillary (05/10 0727) BP: 146/82 (05/10 0727) Pulse Rate: 93 (05/10 0727)  Labs: Recent Labs    07/26/23 0602 07/27/23 1003 07/27/23 1227 07/28/23 0506  HGB 10.3* 9.8*  --   --   HCT 33.6* 31.7*  --   --   PLT 371 381  --   --   LABPROT 19.6* 21.2*  --  22.5*  INR 1.6* 1.8*  --  2.0*  CREATININE  --  1.07*  --   --   TROPONINIHS  --  9 8  --     Estimated Creatinine Clearance: 68.6 mL/min (A) (by C-G formula based on SCr of 1.07 mg/dL (H)).   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   COPD (chronic obstructive pulmonary disease) (HCC)    inhalers dependent on environment   Depression    Diabetes mellitus    120s usually fasting -  dx more than 10 yrs ago   Dizziness, nonspecific    DVT (deep venous thrombosis) (HCC)    Recurrent   Fibromyalgia    GERD (gastroesophageal reflux disease)    Heart murmur    History of blood  transfusion    History of cocaine abuse (HCC)    Remote history    Hyperlipidemia    Hypertension    takes meds daily   Hypothyroidism    Lupus Anticoagulant Positive    On home oxygen  therapy    at night   Shortness of breath    exertion or lying flat   Sleep apnea    2l of oxygen  at night (as of 12/6, she used to)    Medications:  Scheduled:   diltiazem   240 mg Oral Daily   DULoxetine   90 mg Oral QHS   fluticasone  furoate-vilanterol  1 puff Inhalation Daily   insulin  aspart  0-15 Units Subcutaneous TID WC   insulin  aspart  0-5 Units Subcutaneous QHS   insulin  aspart  4 Units Subcutaneous TID WC   magnesium  oxide  400 mg Oral BID   pantoprazole   40 mg Oral Daily   polyethylene glycol  17 g Oral BID   QUEtiapine   150 mg Oral QHS   rosuvastatin   10 mg Oral Daily   Warfarin - Pharmacist Dosing Inpatient   Does not apply q1600   zonisamide   100 mg Oral Daily   And   zonisamide   200 mg Oral QHS   Infusions:  sodium chloride  75 mL/hr at 07/27/23 1635   Assessment: 68 yo female with a PMH of 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 Patient was recently hospitalized from 07/22/23-/07/26/23 at [facility] for RLE bruising with a supratherapeutic INR. During admission, patient received 7.5 mg of vitamin K on 5/5.   Patient's home regimen is 10 mg MWF and 7.5 mg all other days. Last dose prior to arrival was on 5/8 and was 7.5 mg.   This mornings INR was therapeutic at 2.0. CBC not yet obtained today but no signs or symptoms of overt bleeding noted per RN. Will continue with patient's home regimen.  Kocian of Therapy:  INR 2-3 Monitor platelets by anticoagulation protocol: Yes   Plan:  Warfarin 7.5 mg x1  Monitor CBC and for signs/symptoms of bleeding   Juleen Oakland, PharmD PGY1 Pharmacy Resident 07/28/2023 8:22 AM

## 2023-07-28 NOTE — Plan of Care (Signed)
  Problem: Clinical Measurements: Goal: Respiratory complications will improve Outcome: Progressing Goal: Cardiovascular complication will be avoided Outcome: Progressing   Problem: Activity: Goal: Risk for activity intolerance will decrease Outcome: Progressing   Problem: Nutrition: Goal: Adequate nutrition will be maintained Outcome: Progressing   Problem: Safety: Goal: Ability to remain free from injury will improve Outcome: Progressing   

## 2023-07-28 NOTE — Progress Notes (Signed)
 Mobility Specialist Progress Note   07/28/23 0930  Orthostatic Lying   BP- Lying 146/82  Orthostatic Sitting  BP- Sitting 148/80  Oxygen  Therapy  SpO2 97 %  Mobility  Activity Ambulated with assistance to bathroom  Level of Assistance Contact guard assist, steadying assist  Assistive Device Front wheel walker  Distance Ambulated (ft) 20 ft  Range of Motion/Exercises Active;All extremities  Activity Response Tolerated fair  Mobility Specialist Start Time (ACUTE ONLY) 0930  Mobility Specialist Stop Time (ACUTE ONLY) 1000  Mobility Specialist Time Calculation (min) (ACUTE ONLY) 30 min   Patient received in supine, calling out and requesting for assistance to bathroom. Completed bed at mobility supervision level, then stood and ambulated to bathroom min guard with slow steady gait. While returning from the bathroom, patient complained of feeling lightheaded. Checked VS while seated at bedside, all VSS. Returned to supine without assistance. Lightheadedness slowly dissipated. Was left in supine with all needs met, call bell in reach.   [name], BS EXP Mobility Specialist Please contact via SecureChat or Rehab office at [phone]

## 2023-07-28 NOTE — Progress Notes (Signed)
   07/27/23 2303  Orthostatic Lying   BP- Lying 148/67  Pulse- Lying 81  Orthostatic Sitting  BP- Sitting 160/83  Pulse- Sitting 83  Orthostatic Standing at 0 minutes  BP- Standing at 0 minutes (!) 150/96  Pulse- Standing at 0 minutes 96  Orthostatic Standing at 3 minutes  BP- Standing at 3 minutes (!) 165/98  Pulse- Standing at 3 minutes 91

## 2023-07-28 NOTE — Progress Notes (Signed)
 Echocardiogram 2D Echocardiogram has been performed.  [name] 07/28/2023, 2:17 PM

## 2023-07-28 NOTE — Progress Notes (Signed)
 Patient complaining of sharp bilateral lower extremity pain 8/10 not relieved by tylenol . Notified Dr Michell Ahumada. Awaiting response.   07/28/23 0622  Provider Notification  Provider Name/Title Dr Michell Ahumada  Date Provider Notified 07/28/23  Time Provider Notified [phone]  Method of Notification Page [facility] chat)  Notification Reason Other (Comment) Hudson Madeira bilateral leg pain 8/10, not relived by tylenol )   Lorri Fukuhara, RN

## 2023-07-28 NOTE — Progress Notes (Signed)
 [facility] PROGRESS NOTE    Progress Note  [name]  ZOX:[id number] DOB: 16-Feb-1956 DOA: 07/27/2023 PCP: Viola Greulich, MD     Brief Narrative:   [name] is an 68 y.o. female with past medical history of antiphospholipid syndrome with recurrent DVT on Coumadin , diabetes mellitus type 2, chronic diastolic dysfunction recently discharged from the hospital on 07/26/2023 for supratherapeutic INR with lower extremity bruising was fine at home was going for an appointment, when she then had a syncopal episode.  Assessment/Plan:   Syncope Orthostatic check on admission and they are negative. MRI of the brain showed no acute findings. EEG results are pending. I believe part of all or part of this syncope is likely due to polypharmacy. PT OT eval is pending.  Acute kidney injury: Likely hemodynamically mediated, her baseline creatinine is around 0.6. Continue to hold Aldactone  and Lasix . Basic metabolic panels pending this morning.  Hypokalemia Repleted orally basic metabolic panels pending this morning.    Essential hypertension: Restart her diltiazem , continue hold Lasix  and Aldactone .  Morbid obesity with BMI of 45.0-49.9, adult (HCC) Noted.  Constipation MiraLAX  twice daily.  Diabetes type 2, controlled (HCC) Continue to hold Invokana  metformin  and Ozempic . A1c is pending, blood glucose well-controlled minimal insulin .  Chronic anticoagulation/Lupus anticoagulant positive INR is therapeutic.  Chronic diastolic CHF (congestive heart failure) (HCC) Noted.  Appears euvolemic. Continue strict I's and O's and daily weights.  Left knee pain: She relates she has had this problem in the past for several years. Started on Tylenol  with no improvement. Use tramadol  this morning.   DVT prophylaxis: coumadin  Family Communication:Daughter Status is: Observation The patient remains OBS appropriate and will d/c before 2 midnights.    Code  Status:     Code Status Orders  (From admission, onward)           Start     Ordered   07/27/23 1446  Full code  Continuous       Question:  By:  Answer:  Consent: discussion documented in EHR   07/27/23 1447           Code Status History     Date Active Date Inactive Code Status Order ID Comments User Context   07/23/2023 0059 07/26/2023 1729 Full Code [id number]  Roxana Copier, DO ED   08/23/2022 1503 09/01/2022 1841 Full Code [id number]  Loma Rising, MD ED   02/28/2021 0839 02/28/2021 1859 Full Code [id number]  Odie Benne, MD Inpatient   03/09/2019 2233 03/16/2019 2113 Full Code [id number]  Juliette Oh, MD ED   02/20/2019 0456 02/20/2019 1911 Full Code [id number]  Sherrilee Doles, MD ED   02/27/2017 1318 02/28/2017 1350 Full Code [id number]  Sim Dryer, MD Inpatient   06/12/2014 1948 06/14/2014 1723 Full Code [id number]  Vanita Gens, MD Inpatient   04/24/2014 2234 04/30/2014 2027 Full Code [id number]  Claretta Croft, MD Inpatient   08/17/2013 1240 08/21/2013 2134 Full Code [id number]  Feliciana Horn, MD Inpatient         IV Access:   Peripheral IV   Procedures and diagnostic studies:   MR BRAIN WO CONTRAST Result Date: 07/28/2023 CLINICAL DATA:  Initial evaluation for acute syncope/presyncope. EXAM: MRI HEAD WITHOUT CONTRAST TECHNIQUE: Multiplanar, multiecho pulse sequences of the brain and surrounding structures were obtained without intravenous contrast. COMPARISON:  CT from earlier the same day. FINDINGS: Brain: Mild age-related cerebral atrophy. Patchy T2/FLAIR hyperintensity involving the periventricular deep white matter both cerebral hemispheres, consistent  with chronic small vessel ischemic disease, mild to moderate in nature. No evidence for acute or subacute infarct. No areas of chronic cortical infarction. No acute intracranial hemorrhage. Single punctate chronic microhemorrhage noted at the right periatrial region. No mass lesion, midline shift or  mass effect. No hydrocephalus or extra-axial fluid collection. Pituitary gland within normal limits. Vascular: Major intracranial vascular flow voids are maintained. Skull and upper cervical spine: Cranial junction with normal limits. Reversal of the normal upper cervical lordosis. Decreased T1 signal intensity within the visualized bone marrow, nonspecific, but most commonly related to anemia, smoking, or obesity. No scalp soft tissue abnormality. Sinuses/Orbits: Prior bilateral ocular lens replacement. Paranasal sinuses are largely clear. No significant mastoid effusion. Other: None. IMPRESSION: 1. No acute intracranial abnormality. 2. Generalized age-related cerebral atrophy with mild to moderate chronic microvascular ischemic disease. Electronically Signed   By: Virgia Griffins M.D.   On: 07/28/2023 03:26   EEG adult Result Date: 07/27/2023 Arleene Lack, MD     07/27/2023  7:52 PM Patient Name: [name] MRN: [id number] Epilepsy Attending: Arleene Lack Referring Physician/Provider: Macdonald Savoy, MD Date: 07/27/2023 Duration: 26.50 mins Patient history: 68yo F with syncope. EEG to evaluate for seizure Level of alertness: Awake, asleep AEDs during EEG study: GBP, ZNS Technical aspects: This EEG study was done with scalp electrodes positioned according to the 10-20 International system of electrode placement. Electrical activity was reviewed with band pass filter of 1-70Hz , sensitivity of 7 uV/mm, display speed of 44mm/sec with a 60Hz  notched filter applied as appropriate. EEG data were recorded continuously and digitally stored.  Video monitoring was available and reviewed as appropriate. Description: The posterior dominant rhythm consists of 8 Hz activity of moderate voltage (25-35 uV) seen predominantly in posterior head regions, symmetric and reactive to eye opening and eye closing. Sleep was characterized by vertex waves, sleep spindles (12 to 14 Hz), maximal frontocentral region.  Hyperventilation and photic stimulation were not performed.   IMPRESSION: This study is within normal limits. No seizures or epileptiform discharges were seen throughout the recording. A normal interictal EEG does not exclude the diagnosis of epilepsy. Arleene Lack   CT Angio Chest PE W and/or Wo Contrast Result Date: 07/27/2023 CLINICAL DATA:  Pulmonary embolism suspected EXAM: CT ANGIOGRAPHY CHEST WITH CONTRAST TECHNIQUE: Multidetector CT imaging of the chest was performed using the standard protocol during bolus administration of intravenous contrast. Multiplanar CT image reconstructions and MIPs were obtained to evaluate the vascular anatomy. Multiplanar image (3D post-processing) reconstructions and MIPs were obtained to evaluate the vascular anatomy. RADIATION DOSE REDUCTION: This exam was performed according to the departmental dose-optimization program which includes automated exposure control, adjustment of the mA and/or kV according to patient size and/or use of iterative reconstruction technique. CONTRAST:  75mL OMNIPAQUE  IOHEXOL  350 MG/ML SOLN COMPARISON:  CT of the heart performed May 07, 2020 FINDINGS: Cardiovascular: Enlarged heart. No thoracic aortic aneurysm. Two vessel aortic arch. The descending thoracic aorta is grossly patent and unremarkable. Main pulmonary artery is within normal limits for diameter. There is no evidence of pulmonary embolism to the segmental level. Mediastinum/Nodes: No evidence of mediastinal lymphadenopathy. Mildly prominent axillary lymph nodes which may be reactive. Lungs/Pleura: A 5 mm noncalcified right lower lobe pulmonary nodule is present and unchanged compared to August 27, 2022. No pleural effusion or pneumothorax. Upper Abdomen: Cholecystectomy. Unchanged right adrenal nodule measuring 3.1 cm. Small hiatal hernia. Musculoskeletal: Degenerative changes in the imaged osseous structures. Review of the MIP images confirms  the above findings. IMPRESSION: 1. No  evidence of pulmonary embolism to the segmental level. Electronically Signed   By: Reagan Camera M.D.   On: 07/27/2023 13:38   CT Head Wo Contrast Result Date: 07/27/2023 CLINICAL DATA:  Mental status change, unknown cause, near syncope EXAM: CT HEAD WITHOUT CONTRAST TECHNIQUE: Contiguous axial images were obtained from the base of the skull through the vertex without intravenous contrast. RADIATION DOSE REDUCTION: This exam was performed according to the departmental dose-optimization program which includes automated exposure control, adjustment of the mA and/or kV according to patient size and/or use of iterative reconstruction technique. COMPARISON:  August 23, 2022 FINDINGS: Brain: The ventricles appear age appropriate. No mass effect or midline shift. Gray-white differentiation is preserved. Scattered periventricular white matter hypoattenuation, most consistent with changes of mild chronic ischemic microvascular disease. 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 No evidence of acute territorial infarction, extra-axial fluid collection, hemorrhage, or mass lesion. The sella and pituitary are within normal limits. The basilar cisterns are patent without downward herniation. The cerebellar hemispheres and vermis are well formed without mass lesion or focal attenuation abnormality. Vascular: No hyperdense vessel. Calcified atherosclerotic plaque within the cavernous/supraclinoid internal carotid arteries. Skull: Normal. Negative for fracture or focal lesion. Sinuses/Orbits: The paranasal sinuses and mastoids are clear. The globes appear intact. No retrobulbar hematoma. Bilateral lens replacements. Other: None. IMPRESSION: 1. No acute intracranial abnormality, specifically, no acute hemorrhage, territorial infarction, or intracranial mass. 2. Sequelae of mild, chronic ischemic microvascular disease with chronic lacunar infarct in the left basal ganglia, unchanged. Electronically Signed   By: Rance Burrows M.D.   On: 07/27/2023 13:14   DG Chest Portable 1 View Result Date: 07/27/2023 CLINICAL DATA:  Altered mental status. EXAM: PORTABLE CHEST 1 VIEW COMPARISON:  07/22/2023. FINDINGS: Low lung volume. Bilateral lung fields are clear. Bilateral costophrenic angles are clear. Elevated right hemidiaphragm noted. Stable mildly enlarged cardio-mediastinal silhouette. No acute osseous abnormalities. Lower cervical spinal fixation hardware noted. The soft tissues are within normal limits. IMPRESSION: *No active disease. Electronically Signed   By: Beula Brunswick M.D.   On: 07/27/2023 10:28     Medical Consultants:   None.   Subjective:    [name] she relates she feels better this morning complaining of knee pain.  Objective:    Vitals:   07/28/23 0616 07/28/23 0622 07/28/23 0632 07/28/23 0727  BP: (!) 160/91   (!) 146/82  Pulse: 93   93  Resp: 16 18 19    Temp: 98.4 F (36.9 C) 98.4 F (36.9 C)    TempSrc: Oral     SpO2: 97%   97%  Weight:      Height:       SpO2: 97 %   Intake/Output Summary (Last 24 hours) at 07/28/2023 0729 Last data filed at 07/27/2023 1945 Gross per 24 hour  Intake 251.63 ml  Output 200 ml  Net 51.63 ml   Filed Weights   07/27/23 1018  Weight: 134 kg    Exam: General exam: In no acute distress. Respiratory system: Good air movement and clear to auscultation. Cardiovascular system: S1 & S2 heard, RRR. No JVD. Gastrointestinal system: Abdomen is nondistended, soft and nontender.  Extremities: No pedal edema. Skin: No rashes, lesions or ulcers Psychiatry: Judgement and insight appear normal. Mood & affect appropriate.    Data Reviewed:    Labs: Basic Metabolic Panel: Recent Labs  Lab 07/22/23 2102 07/23/23 0521 07/27/23 1003 07/27/23 1641  NA 139 141  137  --   K 4.0 4.0 3.4*  --   CL 102 106 103  --   CO2 26 24 23   --   GLUCOSE 133* 120* 219*  --   BUN 14 15 20   --   CREATININE 0.84 0.66 1.07*  --   CALCIUM  7.7* 7.9*  8.6*  --   MG  --  1.7  --  1.6*   GFR Estimated Creatinine Clearance: 68.6 mL/min (A) (by C-G formula based on SCr of 1.07 mg/dL (H)). Liver Function Tests: Recent Labs  Lab 07/22/23 2102 07/23/23 0521 07/27/23 1003  AST 19 17 20   ALT 19 18 18   ALKPHOS 76 77 68  BILITOT 0.3 0.4 0.5  PROT 8.2* 8.0 7.3  ALBUMIN  3.6 3.7 3.2*   No results for input(s): "LIPASE", "AMYLASE" in the last 168 hours. No results for input(s): "AMMONIA" in the last 168 hours. Coagulation profile Recent Labs  Lab 07/24/23 0553 07/25/23 0558 07/26/23 0602 07/27/23 1003 07/28/23 0506  INR 2.2* 1.7* 1.6* 1.8* 2.0*   COVID-19 Labs  No results for input(s): "DDIMER", "FERRITIN", "LDH", "CRP" in the last 72 hours.  Lab Results  Component Value Date   SARSCOV2NAA NEGATIVE 07/27/2023   SARSCOV2NAA NEGATIVE 08/08/2019   SARSCOV2NAA Not Detected 04/04/2019   SARSCOV2NAA POSITIVE (A) 03/09/2019    CBC: Recent Labs  Lab 07/22/23 2102 07/23/23 0521 07/23/23 0935 07/23/23 1708 07/24/23 0553 07/25/23 0648 07/26/23 0602 07/27/23 1003  WBC 8.0 7.3  --   --  9.7 7.5 6.7 6.5  NEUTROABS 4.6 4.0  --   --   --   --   --  3.5  HGB 10.7* 10.5*   < > 10.4* 10.3* 10.1* 10.3* 9.8*  HCT 33.7* 33.8*   < > 33.9* 33.9* 32.1* 33.6* 31.7*  MCV 83.4 84.7  --   --  86.0 84.5 86.2 85.4  PLT 380 356  --   --  374 368 371 381   < > = values in this interval not displayed.   Cardiac Enzymes: Recent Labs  Lab 07/22/23 2102  CKTOTAL 201   BNP (last 3 results) Recent Labs    07/11/23 1050  PROBNP 18.0   CBG: Recent Labs  Lab 07/26/23 0728 07/26/23 1154 07/27/23 1009 07/27/23 1650 07/27/23 2128  GLUCAP 121* 93 210* 143* 129*   D-Dimer: No results for input(s): "DDIMER" in the last 72 hours. Hgb A1c: No results for input(s): "HGBA1C" in the last 72 hours. Lipid Profile: No results for input(s): "CHOL", "HDL", "LDLCALC", "TRIG", "CHOLHDL", "LDLDIRECT" in the last 72 hours. Thyroid  function  studies: No results for input(s): "TSH", "T4TOTAL", "T3FREE", "THYROIDAB" in the last 72 hours.  Invalid input(s): "FREET3" Anemia work up: No results for input(s): "VITAMINB12", "FOLATE", "FERRITIN", "TIBC", "IRON ", "RETICCTPCT" in the last 72 hours. Sepsis Labs: Recent Labs  Lab 07/24/23 0553 07/25/23 0648 07/26/23 0602 07/27/23 1003  WBC 9.7 7.5 6.7 6.5   Microbiology Recent Results (from the past 240 hours)  Resp panel by RT-PCR (RSV, Flu A&B, Covid) Anterior Nasal Swab     Status: None   Collection Time: 07/27/23 10:07 AM   Specimen: Anterior Nasal Swab  Result Value Ref Range Status   SARS Coronavirus 2 by RT PCR NEGATIVE NEGATIVE Final   Influenza A by PCR NEGATIVE NEGATIVE Final   Influenza B by PCR NEGATIVE NEGATIVE Final    Comment: (NOTE) The Xpert Xpress SARS-CoV-2/FLU/RSV plus assay is intended as an aid in the diagnosis of influenza  from Nasopharyngeal swab specimens and should not be used as a sole basis for treatment. Nasal washings and aspirates are unacceptable for Xpert Xpress SARS-CoV-2/FLU/RSV testing.  Fact Sheet for Patients: BloggerCourse.com  Fact Sheet for Healthcare Providers: SeriousBroker.it  This test is not yet approved or cleared by the [place]  FDA and has been authorized for detection and/or diagnosis of SARS-CoV-2 by FDA under an Emergency Use Authorization (EUA). This EUA will remain in effect (meaning this test can be used) for the duration of the COVID-19 declaration under Section 564(b)(1) of the Act, 21 U.S.C. section 360bbb-3(b)(1), unless the authorization is terminated or revoked.     Resp Syncytial Virus by PCR NEGATIVE NEGATIVE Final    Comment: (NOTE) Fact Sheet for Patients: BloggerCourse.com  Fact Sheet for Healthcare Providers: SeriousBroker.it  This test is not yet approved or cleared by the [place]  FDA  and has been authorized for detection and/or diagnosis of SARS-CoV-2 by FDA under an Emergency Use Authorization (EUA). This EUA will remain in effect (meaning this test can be used) for the duration of the COVID-19 declaration under Section 564(b)(1) of the Act, 21 U.S.C. section 360bbb-3(b)(1), unless the authorization is terminated or revoked.  Performed at [facility], [address] [place], [place] [address]      Medications:    DULoxetine   90 mg Oral QHS   fluticasone  furoate-vilanterol  1 puff Inhalation Daily   insulin  aspart  0-15 Units Subcutaneous TID WC   insulin  aspart  0-5 Units Subcutaneous QHS   insulin  aspart  4 Units Subcutaneous TID WC   pantoprazole   40 mg Oral Daily   polyethylene glycol  17 g Oral BID   QUEtiapine   150 mg Oral QHS   rosuvastatin   10 mg Oral Daily   Warfarin - Pharmacist Dosing Inpatient   Does not apply q1600   zonisamide   100 mg Oral Daily   And   zonisamide   200 mg Oral QHS   Continuous Infusions:  sodium chloride  75 mL/hr at 07/27/23 1635      LOS: 0 days   Macdonald Savoy  [facility]  07/28/2023, 7:29 AM

## 2023-07-29 DIAGNOSIS — I5032 Chronic diastolic (congestive) heart failure: Secondary | ICD-10-CM | POA: Diagnosis not present

## 2023-07-29 DIAGNOSIS — E876 Hypokalemia: Secondary | ICD-10-CM | POA: Diagnosis not present

## 2023-07-29 DIAGNOSIS — R760 Raised antibody titer: Secondary | ICD-10-CM | POA: Diagnosis not present

## 2023-07-29 DIAGNOSIS — R55 Syncope and collapse: Secondary | ICD-10-CM | POA: Diagnosis not present

## 2023-07-29 DIAGNOSIS — Z7901 Long term (current) use of anticoagulants: Secondary | ICD-10-CM | POA: Diagnosis not present

## 2023-07-29 LAB — URINALYSIS, ROUTINE W REFLEX MICROSCOPIC
Bilirubin Urine: NEGATIVE
Glucose, UA: NEGATIVE mg/dL
Hgb urine dipstick: NEGATIVE
Ketones, ur: NEGATIVE mg/dL
Leukocytes,Ua: NEGATIVE
Nitrite: NEGATIVE
Protein, ur: NEGATIVE mg/dL
Specific Gravity, Urine: 1.012 (ref 1.005–1.030)
pH: 5 (ref 5.0–8.0)

## 2023-07-29 LAB — BASIC METABOLIC PANEL WITH GFR
Anion gap: 9 (ref 5–15)
BUN: 12 mg/dL (ref 8–23)
CO2: 25 mmol/L (ref 22–32)
Calcium: 9.1 mg/dL (ref 8.9–10.3)
Chloride: 103 mmol/L (ref 98–111)
Creatinine, Ser: 0.75 mg/dL (ref 0.44–1.00)
GFR, Estimated: >60 mL/min (ref >60)
Glucose, Bld: 129 mg/dL — ABNORMAL HIGH (ref 70–99)
Potassium: 4.1 mmol/L (ref 3.5–5.1)
Sodium: 137 mmol/L (ref 135–145)

## 2023-07-29 LAB — GLUCOSE, CAPILLARY
Glucose-Capillary: 162 mg/dL — ABNORMAL HIGH (ref 70–99)
Glucose-Capillary: 172 mg/dL — ABNORMAL HIGH (ref 70–99)
Glucose-Capillary: 180 mg/dL — ABNORMAL HIGH (ref 70–99)
Glucose-Capillary: 135 mg/dL — ABNORMAL HIGH (ref 70–99)
Glucose-Capillary: 133 mg/dL — ABNORMAL HIGH (ref 70–99)

## 2023-07-29 LAB — PROTIME-INR
INR: 2.2 — ABNORMAL HIGH (ref 0.8–1.2)
Prothrombin Time: 25 s — ABNORMAL HIGH (ref 11.4–15.2)

## 2023-07-29 LAB — MAGNESIUM: Magnesium: 1.6 mg/dL — ABNORMAL LOW (ref 1.7–2.4)

## 2023-07-29 MED ORDER — HYDROCODONE-ACETAMINOPHEN 7.5-325 MG PO TABS
1.0000 | ORAL_TABLET | Freq: Four times a day (QID) | ORAL | Status: DC | PRN
  Administered 2023-07-29 – 2023-07-30 (×4): 2 via ORAL
  Filled 2023-07-29 (×4): qty 2

## 2023-07-29 MED ORDER — WARFARIN SODIUM 7.5 MG PO TABS
7.5000 mg | ORAL_TABLET | Freq: Once | ORAL | Status: AC
  Administered 2023-07-29: 7.5 mg via ORAL
  Filled 2023-07-29: qty 1

## 2023-07-29 MED ORDER — FUROSEMIDE 40 MG PO TABS
40.0000 mg | ORAL_TABLET | Freq: Every day | ORAL | Status: DC
  Administered 2023-07-29 – 2023-07-30 (×2): 40 mg via ORAL
  Filled 2023-07-29 (×2): qty 1

## 2023-07-29 MED ORDER — MAGNESIUM OXIDE -MG SUPPLEMENT 400 (240 MG) MG PO TABS
400.0000 mg | ORAL_TABLET | Freq: Two times a day (BID) | ORAL | Status: AC
  Administered 2023-07-29 (×2): 400 mg via ORAL
  Filled 2023-07-29 (×2): qty 1

## 2023-07-29 MED ORDER — MAGNESIUM SULFATE 2 GM/50ML IV SOLN
2.0000 g | Freq: Once | INTRAVENOUS | Status: AC
  Administered 2023-07-29: 2 g via INTRAVENOUS
  Filled 2023-07-29: qty 50

## 2023-07-29 NOTE — Progress Notes (Signed)
 [facility] PROGRESS NOTE    Progress Note  [name]  WUX:[id number] DOB: 02/18/1956 DOA: 07/27/2023 PCP: Viola Greulich, MD     Brief Narrative:   [name] is an 68 y.o. female with past medical history of antiphospholipid syndrome with recurrent DVT on Coumadin , diabetes mellitus type 2, chronic diastolic dysfunction recently discharged from the hospital on 07/26/2023 for supratherapeutic INR with lower extremity bruising was fine at home was going for an appointment, when she then had a syncopal episode.  Assessment/Plan:   Syncope Orthostatic check on admission and they are negative. MRI of the brain showed no acute findings. EEG results are pending. I believe part of all or part of this syncope is likely due to polypharmacy. PT OT recommended home health PT.  Acute kidney injury: Likely hemodynamically mediated, her baseline creatinine is around 0.6. Creatinine yesterday improved to 0.8. Basic metabolic panels pending this morning.  Hypokalemia/hypomagnesemia Magnesium  still significantly low replete IV and orally recheck tomorrow morning. Try to keep potassium greater than 4 magnesium  greater than 2.    Essential hypertension: Continue her diltiazem , continue hold  Aldactone . Resume Lasix . Repeat a basic metabolic panel in the morning.  Morbid obesity with BMI of 45.0-49.9, adult (HCC) Noted.  Constipation MiraLAX  twice daily.  Diabetes type 2, controlled (HCC) Continue to hold Invokana  metformin  and Ozempic . A1c is 6.5 blood glucose well-controlled minimal insulin .  Chronic anticoagulation/Lupus anticoagulant positive INR is therapeutic.  Chronic diastolic CHF (congestive heart failure) (HCC) Noted.  Appears euvolemic. Continue strict I's and O's and daily weights.  Left knee pain: She relates she has had this problem in the past for several years. Started on Tylenol  with no improvement. Use tramadol  this morning.   DVT  prophylaxis: coumadin  Family Communication:Daughter Status is: Observation The patient remains OBS appropriate and will d/c before 2 midnights.    Code Status:     Code Status Orders  (From admission, onward)           Start     Ordered   07/27/23 1446  Full code  Continuous       Question:  By:  Answer:  Consent: discussion documented in EHR   07/27/23 1447           Code Status History     Date Active Date Inactive Code Status Order ID Comments User Context   07/23/2023 0059 07/26/2023 1729 Full Code [id number]  Roxana Copier, DO ED   08/23/2022 1503 09/01/2022 1841 Full Code [id number]  Loma Rising, MD ED   02/28/2021 0839 02/28/2021 1859 Full Code [id number]  Odie Benne, MD Inpatient   03/09/2019 2233 03/16/2019 2113 Full Code [id number]  Juliette Oh, MD ED   02/20/2019 0456 02/20/2019 1911 Full Code [id number]  Sherrilee Doles, MD ED   02/27/2017 1318 02/28/2017 1350 Full Code [id number]  Sim Dryer, MD Inpatient   06/12/2014 1948 06/14/2014 1723 Full Code [id number]  Vanita Gens, MD Inpatient   04/24/2014 2234 04/30/2014 2027 Full Code [id number]  Claretta Croft, MD Inpatient   08/17/2013 1240 08/21/2013 2134 Full Code [id number]  Feliciana Horn, MD Inpatient         IV Access:   Peripheral IV   Procedures and diagnostic studies:   ECHOCARDIOGRAM COMPLETE Result Date: 07/28/2023    ECHOCARDIOGRAM REPORT   Patient Name:   [name] Date of Exam: 07/28/2023 Medical Rec #:  [id number]           Height:  64.0 in Accession #:    [id number]          Weight:       295.4 lb Date of Birth:  Aug 05, 1955           BSA:          2.309 m Patient Age:    68 years            BP:           146/82 mmHg Patient Gender: F                   HR:           88 bpm. Exam Location:  Inpatient Procedure: 2D Echo, Cardiac Doppler and Color Doppler (Both Spectral and Color            Flow Doppler were utilized during procedure). Indications:    Syncope R55  History:         Patient has prior history of Echocardiogram examinations, most                 recent 08/24/2022. CHF, COPD, Arrythmias:Tachycardia,                 Signs/Symptoms:Syncope; Risk Factors:Hypertension, Sleep Apnea,                 Diabetes and Dyslipidemia.  Sonographer:    Terrilee Few RCS Referring Phys: [phone] [facility] ORTI[facility]  Sonographer Comments: Patient is obese. Image acquisition challenging due to patient body habitus. IMPRESSIONS  1. Left ventricular ejection fraction, by estimation, is 60 to 65%. The left ventricle has normal function. The left ventricle has no regional wall motion abnormalities. Left ventricular diastolic parameters are indeterminate.  2. Right ventricular systolic function is normal. The right ventricular size is normal. Tricuspid regurgitation signal is inadequate for assessing PA pressure.  3. The mitral valve is normal in structure. No evidence of mitral valve regurgitation. No evidence of mitral stenosis.  4. The aortic valve is normal in structure. Aortic valve regurgitation is not visualized. No aortic stenosis is present.  5. The inferior vena cava is normal in size with greater than 50% respiratory variability, suggesting right atrial pressure of 3 mmHg. FINDINGS  Left Ventricle: Left ventricular ejection fraction, by estimation, is 60 to 65%. The left ventricle has normal function. The left ventricle has no regional wall motion abnormalities. The left ventricular internal cavity size was normal in size. There is  no left ventricular hypertrophy. Left ventricular diastolic parameters are indeterminate. Right Ventricle: The right ventricular size is normal. No increase in right ventricular wall thickness. Right ventricular systolic function is normal. Tricuspid regurgitation signal is inadequate for assessing PA pressure. Left Atrium: Left atrial size was normal in size. Right Atrium: Right atrial size was normal in size. Pericardium: There is no evidence of pericardial  effusion. Presence of epicardial fat layer. Mitral Valve: The mitral valve is normal in structure. No evidence of mitral valve regurgitation. No evidence of mitral valve stenosis. Tricuspid Valve: The tricuspid valve is normal in structure. Tricuspid valve regurgitation is not demonstrated. No evidence of tricuspid stenosis. Aortic Valve: The aortic valve is normal in structure. Aortic valve regurgitation is not visualized. No aortic stenosis is present. Aortic valve peak gradient measures 14.4 mmHg. Pulmonic Valve: The pulmonic valve was normal in structure. Pulmonic valve regurgitation is not visualized. No evidence of pulmonic stenosis. Aorta: The aortic root is normal in size and structure. Venous: The inferior vena  cava is normal in size with greater than 50% respiratory variability, suggesting right atrial pressure of 3 mmHg. IAS/Shunts: No atrial level shunt detected by color flow Doppler.  LEFT VENTRICLE PLAX 2D LVIDd:         5.10 cm   Diastology LVIDs:         3.30 cm   LV e' medial:    7.72 cm/s LV PW:         1.00 cm   LV E/e' medial:  10.6 LV IVS:        0.70 cm   LV e' lateral:   8.70 cm/s LVOT diam:     2.20 cm   LV E/e' lateral: 9.4 LV SV:         75 LV SV Index:   33 LVOT Area:     3.80 cm  RIGHT VENTRICLE RV S prime:     9.70 cm/s LEFT ATRIUM           Index        RIGHT ATRIUM           Index LA diam:      3.60 cm 1.56 cm/m   RA Area:     13.20 cm LA Vol (A2C): 30.4 ml 13.17 ml/m  RA Volume:   31.50 ml  13.64 ml/m LA Vol (A4C): 59.6 ml 25.81 ml/m  AORTIC VALVE AV Area (Vmax): 2.20 cm AV Vmax:        190.00 cm/s AV Peak Grad:   14.4 mmHg LVOT Vmax:      110.00 cm/s LVOT Vmean:     72.600 cm/s LVOT VTI:       0.198 m  AORTA Ao Root diam: 3.20 cm Ao Asc diam:  3.30 cm MITRAL VALVE MV Area (PHT): 4.06 cm     SHUNTS MV Decel Time: 187 msec     Systemic VTI:  0.20 m MV E velocity: 81.80 cm/s   Systemic Diam: 2.20 cm MV A velocity: 104.00 cm/s MV E/A ratio:  0.79 Kardie Tobb DO Electronically  signed by Jerryl Morin DO Signature Date/Time: 07/28/2023/3:15:34 PM    Final    MR BRAIN WO CONTRAST Result Date: 07/28/2023 CLINICAL DATA:  Initial evaluation for acute syncope/presyncope. EXAM: MRI HEAD WITHOUT CONTRAST TECHNIQUE: Multiplanar, multiecho pulse sequences of the brain and surrounding structures were obtained without intravenous contrast. COMPARISON:  CT from earlier the same day. FINDINGS: Brain: Mild age-related cerebral atrophy. Patchy T2/FLAIR hyperintensity involving the periventricular deep white matter both cerebral hemispheres, consistent with chronic small vessel ischemic disease, mild to moderate in nature. No evidence for acute or subacute infarct. No areas of chronic cortical infarction. No acute intracranial hemorrhage. Single punctate chronic microhemorrhage noted at the right periatrial region. No mass lesion, midline shift or mass effect. No hydrocephalus or extra-axial fluid collection. Pituitary gland within normal limits. Vascular: Major intracranial vascular flow voids are maintained. Skull and upper cervical spine: Cranial junction with normal limits. Reversal of the normal upper cervical lordosis. Decreased T1 signal intensity within the visualized bone marrow, nonspecific, but most commonly related to anemia, smoking, or obesity. No scalp soft tissue abnormality. Sinuses/Orbits: Prior bilateral ocular lens replacement. Paranasal sinuses are largely clear. No significant mastoid effusion. Other: None. IMPRESSION: 1. No acute intracranial abnormality. 2. Generalized age-related cerebral atrophy with mild to moderate chronic microvascular ischemic disease. Electronically Signed   By: Virgia Griffins M.D.   On: 07/28/2023 03:26   EEG adult Result Date: 07/27/2023 Arleene Lack, MD  07/27/2023  7:52 PM Patient Name: Laquasha Navia Mccowan MRN: [id number] Epilepsy Attending: Arleene Lack Referring Physician/Provider: Macdonald Savoy, MD Date: 07/27/2023 Duration:  26.50 mins Patient history: 68yo F with syncope. EEG to evaluate for seizure Level of alertness: Awake, asleep AEDs during EEG study: GBP, ZNS Technical aspects: This EEG study was done with scalp electrodes positioned according to the 10-20 International system of electrode placement. Electrical activity was reviewed with band pass filter of 1-70Hz , sensitivity of 7 uV/mm, display speed of 91mm/sec with a 60Hz  notched filter applied as appropriate. EEG data were recorded continuously and digitally stored.  Video monitoring was available and reviewed as appropriate. Description: The posterior dominant rhythm consists of 8 Hz activity of moderate voltage (25-35 uV) seen predominantly in posterior head regions, symmetric and reactive to eye opening and eye closing. Sleep was characterized by vertex waves, sleep spindles (12 to 14 Hz), maximal frontocentral region. Hyperventilation and photic stimulation were not performed.   IMPRESSION: This study is within normal limits. No seizures or epileptiform discharges were seen throughout the recording. A normal interictal EEG does not exclude the diagnosis of epilepsy. Arleene Lack   CT Angio Chest PE W and/or Wo Contrast Result Date: 07/27/2023 CLINICAL DATA:  Pulmonary embolism suspected EXAM: CT ANGIOGRAPHY CHEST WITH CONTRAST TECHNIQUE: Multidetector CT imaging of the chest was performed using the standard protocol during bolus administration of intravenous contrast. Multiplanar CT image reconstructions and MIPs were obtained to evaluate the vascular anatomy. Multiplanar image (3D post-processing) reconstructions and MIPs were obtained to evaluate the vascular anatomy. RADIATION DOSE REDUCTION: This exam was performed according to the departmental dose-optimization program which includes automated exposure control, adjustment of the mA and/or kV according to patient size and/or use of iterative reconstruction technique. CONTRAST:  75mL OMNIPAQUE  IOHEXOL  350 MG/ML  SOLN COMPARISON:  CT of the heart performed May 07, 2020 FINDINGS: Cardiovascular: Enlarged heart. No thoracic aortic aneurysm. Two vessel aortic arch. The descending thoracic aorta is grossly patent and unremarkable. Main pulmonary artery is within normal limits for diameter. There is no evidence of pulmonary embolism to the segmental level. Mediastinum/Nodes: No evidence of mediastinal lymphadenopathy. Mildly prominent axillary lymph nodes which may be reactive. Lungs/Pleura: A 5 mm noncalcified right lower lobe pulmonary nodule is present and unchanged compared to August 27, 2022. No pleural effusion or pneumothorax. Upper Abdomen: Cholecystectomy. Unchanged right adrenal nodule measuring 3.1 cm. Small hiatal hernia. Musculoskeletal: Degenerative changes in the imaged osseous structures. Review of the MIP images confirms the above findings. IMPRESSION: 1. No evidence of pulmonary embolism to the segmental level. Electronically Signed   By: Reagan Camera M.D.   On: 07/27/2023 13:38     Medical Consultants:   None.   Subjective:    Marilyn Shropshire Monks no complaints today she relates she feels better.  Objective:    Vitals:   07/28/23 2351 07/29/23 0430 07/29/23 0751 07/29/23 0814  BP: (!) 148/70 (!) 152/76 (!) 157/85   Pulse: 73 80 87   Resp: 20 15    Temp: 97.9 F (36.6 C) 97.9 F (36.6 C) 97.8 F (36.6 C)   TempSrc: Oral Oral Oral   SpO2: 95% 97% 97% 98%  Weight:      Height:       SpO2: 98 %   Intake/Output Summary (Last 24 hours) at 07/29/2023 1103 Last data filed at 07/29/2023 0752 Gross per 24 hour  Intake --  Output 700 ml  Net -700 ml   [employer]  07/27/23 1018  Weight: 134 kg    Exam: General exam: In no acute distress. Respiratory system: Good air movement and clear to auscultation. Cardiovascular system: S1 & S2 heard, RRR. No JVD. Gastrointestinal system: Abdomen is nondistended, soft and nontender.  Extremities: No pedal edema. Skin: No rashes,  lesions or ulcers Psychiatry: Judgement and insight appear normal. Mood & affect appropriate.  Data Reviewed:    Labs: Basic Metabolic Panel: Recent Labs  Lab 07/22/23 2102 07/23/23 0521 07/27/23 1003 07/27/23 1641 07/28/23 0954 07/29/23 0426  NA 139 141 137  --  138  --   K 4.0 4.0 3.4*  --  4.1  --   CL 102 106 103  --  107  --   CO2 26 24 23   --  21*  --   GLUCOSE 133* 120* 219*  --  134*  --   BUN 14 15 20   --  17  --   CREATININE 0.84 0.66 1.07*  --  0.85  --   CALCIUM  7.7* 7.9* 8.6*  --  8.8*  --   MG  --  1.7  --  1.6*  --  1.6*   GFR Estimated Creatinine Clearance: 86.4 mL/min (by C-G formula based on SCr of 0.85 mg/dL). Liver Function Tests: Recent Labs  Lab 07/22/23 2102 07/23/23 0521 07/27/23 1003  AST 19 17 20   ALT 19 18 18   ALKPHOS 76 77 68  BILITOT 0.3 0.4 0.5  PROT 8.2* 8.0 7.3  ALBUMIN  3.6 3.7 3.2*   No results for input(s): "LIPASE", "AMYLASE" in the last 168 hours. No results for input(s): "AMMONIA" in the last 168 hours. Coagulation profile Recent Labs  Lab 07/25/23 0558 07/26/23 0602 07/27/23 1003 07/28/23 0506 07/29/23 0426  INR 1.7* 1.6* 1.8* 2.0* 2.2*   COVID-19 Labs  Recent Labs    07/28/23 0954  FERRITIN 62    Lab Results  Component Value Date   SARSCOV2NAA NEGATIVE 07/27/2023   SARSCOV2NAA NEGATIVE 08/08/2019   SARSCOV2NAA Not Detected 04/04/2019   SARSCOV2NAA POSITIVE (A) 03/09/2019    CBC: Recent Labs  Lab 07/22/23 2102 07/23/23 0521 07/23/23 0935 07/25/23 0648 07/26/23 0602 07/27/23 1003 07/28/23 0506 07/28/23 0954  WBC 8.0 7.3   < > 7.5 6.7 6.5 7.9 7.3  NEUTROABS 4.6 4.0  --   --   --  3.5  --   --   HGB 10.7* 10.5*   < > 10.1* 10.3* 9.8* 8.9* 9.4*  HCT 33.7* 33.8*   < > 32.1* 33.6* 31.7* 28.5* 30.4*  MCV 83.4 84.7   < > 84.5 86.2 85.4 84.3 84.2  PLT 380 356   < > 368 371 381 362 364   < > = values in this interval not displayed.   Cardiac Enzymes: Recent Labs  Lab 07/22/23 2102  CKTOTAL 201    BNP (last 3 results) Recent Labs    07/11/23 1050  PROBNP 18.0   CBG: Recent Labs  Lab 07/28/23 0742 07/28/23 1250 07/28/23 1646 07/28/23 2058 07/29/23 0750  GLUCAP 112* 109* 112* 127* 162*   D-Dimer: No results for input(s): "DDIMER" in the last 72 hours. Hgb A1c: Recent Labs    07/28/23 0954  HGBA1C 6.5*   Lipid Profile: No results for input(s): "CHOL", "HDL", "LDLCALC", "TRIG", "CHOLHDL", "LDLDIRECT" in the last 72 hours. Thyroid  function studies: No results for input(s): "TSH", "T4TOTAL", "T3FREE", "THYROIDAB" in the last 72 hours.  Invalid input(s): "FREET3" Anemia work up: [employer]    07/28/23 540 438 9588  VITAMINB12 275  FOLATE 17.5  FERRITIN 62  TIBC 322  IRON  43  RETICCTPCT 2.8   Sepsis Labs: Recent Labs  Lab 07/26/23 0602 07/27/23 1003 07/28/23 0506 07/28/23 0954  WBC 6.7 6.5 7.9 7.3   Microbiology Recent Results (from the past 240 hours)  Resp panel by RT-PCR (RSV, Flu A&B, Covid) Anterior Nasal Swab     Status: None   Collection Time: 07/27/23 10:07 AM   Specimen: Anterior Nasal Swab  Result Value Ref Range Status   SARS Coronavirus 2 by RT PCR NEGATIVE NEGATIVE Final   Influenza A by PCR NEGATIVE NEGATIVE Final   Influenza B by PCR NEGATIVE NEGATIVE Final    Comment: (NOTE) The Xpert Xpress SARS-CoV-2/FLU/RSV plus assay is intended as an aid in the diagnosis of influenza from Nasopharyngeal swab specimens and should not be used as a sole basis for treatment. Nasal washings and aspirates are unacceptable for Xpert Xpress SARS-CoV-2/FLU/RSV testing.  Fact Sheet for Patients: BloggerCourse.com  Fact Sheet for Healthcare Providers: SeriousBroker.it  This test is not yet approved or cleared by the [place]  FDA and has been authorized for detection and/or diagnosis of SARS-CoV-2 by FDA under an Emergency Use Authorization (EUA). This EUA will remain in effect (meaning this test can  be used) for the duration of the COVID-19 declaration under Section 564(b)(1) of the Act, 21 U.S.C. section 360bbb-3(b)(1), unless the authorization is terminated or revoked.     Resp Syncytial Virus by PCR NEGATIVE NEGATIVE Final    Comment: (NOTE) Fact Sheet for Patients: BloggerCourse.com  Fact Sheet for Healthcare Providers: SeriousBroker.it  This test is not yet approved or cleared by the [place]  FDA and has been authorized for detection and/or diagnosis of SARS-CoV-2 by FDA under an Emergency Use Authorization (EUA). This EUA will remain in effect (meaning this test can be used) for the duration of the COVID-19 declaration under Section 564(b)(1) of the Act, 21 U.S.C. section 360bbb-3(b)(1), unless the authorization is terminated or revoked.  Performed at [facility], [address] , [place] [address]      Medications:    diltiazem   240 mg Oral Daily   DULoxetine   90 mg Oral QHS   fluticasone  furoate-vilanterol  1 puff Inhalation Daily   insulin  aspart  0-15 Units Subcutaneous TID WC   insulin  aspart  0-5 Units Subcutaneous QHS   insulin  aspart  4 Units Subcutaneous TID WC   magnesium  oxide  400 mg Oral BID   pantoprazole   40 mg Oral Daily   polyethylene glycol  17 g Oral BID   QUEtiapine   150 mg Oral QHS   rosuvastatin   10 mg Oral Daily   warfarin  7.5 mg Oral ONCE-1600   Warfarin - Pharmacist Dosing Inpatient   Does not apply q1600   zonisamide   100 mg Oral Daily   And   zonisamide   200 mg Oral QHS   Continuous Infusions:      LOS: 0 days   Macdonald Savoy  [facility]  07/29/2023, 11:03 AM

## 2023-07-29 NOTE — Progress Notes (Signed)
 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, recurrent DVTs)  Allergies  Allergen Reactions   Lisinopril  Anaphylaxis, Swelling and Other (See Comments)    THROAT SWELLING, Pt states her throat started to "close up" after being on it for "so long."    Prednisone  Anaphylaxis, Swelling and Other (See Comments)    Throat swelling and tongue irritation   Spironolactone  Other (See Comments)    Caused stroke-like symptoms with the mouth   Corticosteroids Rash    States she developed a rash on her tongue after a steroid injection in her back    Patient Measurements: Height: 5\' 4"  (162.6 cm) Weight: 134 kg (295 lb 6.7 oz) IBW/kg (Calculated) : 54.7 HEPARIN  DW (KG): 94.1  Vital Signs: Temp: 97.9 F (36.6 C) (05/11 0430) Temp Source: Oral (05/11 0430) BP: 152/76 (05/11 0430) Pulse Rate: 80 (05/11 0430)  Labs: Recent Labs    07/27/23 1003 07/27/23 1227 07/28/23 0506 07/28/23 0954 07/29/23 0426  HGB 9.8*  --  8.9* 9.4*  --   HCT 31.7*  --  28.5* 30.4*  --   PLT 381  --  362 364  --   LABPROT 21.2*  --  22.5*  --  25.0*  INR 1.8*  --  2.0*  --  2.2*  CREATININE 1.07*  --   --  0.85  --   TROPONINIHS 9 8  --   --   --     Estimated Creatinine Clearance: 86.4 mL/min (by C-G formula based on SCr of 0.85 mg/dL).   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   COPD (chronic obstructive pulmonary disease) (HCC)    inhalers dependent on environment   Depression    Diabetes mellitus    120s usually fasting -  dx more than 10 yrs ago   Dizziness, nonspecific    DVT (deep venous thrombosis) (HCC)    Recurrent   Fibromyalgia    GERD (gastroesophageal reflux disease)     Heart murmur    History of blood transfusion    History of cocaine abuse (HCC)    Remote history    Hyperlipidemia    Hypertension    takes meds daily   Hypothyroidism    Lupus Anticoagulant Positive    On home oxygen  therapy    at night   Shortness of breath    exertion or lying flat   Sleep apnea    2l of oxygen  at night (as of 12/6, she used to)    Medications:  Scheduled:   diltiazem   240 mg Oral Daily   DULoxetine   90 mg Oral QHS   fluticasone  furoate-vilanterol  1 puff Inhalation Daily   insulin  aspart  0-15 Units Subcutaneous TID WC   insulin  aspart  0-5 Units Subcutaneous QHS   insulin  aspart  4 Units Subcutaneous TID WC   pantoprazole   40 mg Oral Daily   polyethylene glycol  17 g Oral BID   QUEtiapine   150 mg Oral QHS   rosuvastatin   10 mg Oral Daily   Warfarin - Pharmacist Dosing Inpatient   Does not apply q1600   zonisamide   100 mg Oral Daily   And   zonisamide   200 mg Oral  QHS   Infusions:    Assessment: 68 yo female with a PMH of 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 Patient was recently hospitalized from 07/22/23-/07/26/23 at [facility] for RLE bruising with a supratherapeutic INR. During admission, patient received 7.5 mg of vitamin K on 5/5.   Patient's home regimen is 10 mg MWF and 7.5 mg all other days. Last dose prior to arrival was on 5/8 and was 7.5 mg.   INR remains therapeutic at 2.2. CBC from 5/10 shows stable Hgb at 9.4, PLT 364. No signs or symptoms of bleeding noted. Will continue patient's home regimen.  Kartes of Therapy:  INR 2-3 Monitor platelets by anticoagulation protocol: Yes   Plan:  Warfarin 7.5 mg x1 today Monitor CBC and for signs/symptoms of bleeding   Juleen Oakland, PharmD PGY1 Pharmacy Resident 07/29/2023 7:45 AM

## 2023-07-30 DIAGNOSIS — R55 Syncope and collapse: Secondary | ICD-10-CM | POA: Diagnosis not present

## 2023-07-30 DIAGNOSIS — R760 Raised antibody titer: Secondary | ICD-10-CM | POA: Diagnosis not present

## 2023-07-30 DIAGNOSIS — I5032 Chronic diastolic (congestive) heart failure: Secondary | ICD-10-CM | POA: Diagnosis not present

## 2023-07-30 LAB — BASIC METABOLIC PANEL WITH GFR
Anion gap: 9 (ref 5–15)
BUN: 13 mg/dL (ref 8–23)
CO2: 26 mmol/L (ref 22–32)
Calcium: 8.7 mg/dL — ABNORMAL LOW (ref 8.9–10.3)
Chloride: 100 mmol/L (ref 98–111)
Creatinine, Ser: 0.85 mg/dL (ref 0.44–1.00)
GFR, Estimated: >60 mL/min (ref >60)
Glucose, Bld: 121 mg/dL — ABNORMAL HIGH (ref 70–99)
Potassium: 4.1 mmol/L (ref 3.5–5.1)
Sodium: 135 mmol/L (ref 135–145)

## 2023-07-30 LAB — CBC
HCT: 30.9 % — ABNORMAL LOW (ref 36.0–46.0)
Hemoglobin: 9.6 g/dL — ABNORMAL LOW (ref 12.0–15.0)
MCH: 26.4 pg (ref 26.0–34.0)
MCHC: 31.1 g/dL (ref 30.0–36.0)
MCV: 85.1 fL (ref 80.0–100.0)
Platelets: 371 10*3/uL (ref 150–400)
RBC: 3.63 MIL/uL — ABNORMAL LOW (ref 3.87–5.11)
RDW: 19.9 % — ABNORMAL HIGH (ref 11.5–15.5)
WBC: 6.3 10*3/uL (ref 4.0–10.5)
nRBC: 0.8 % — ABNORMAL HIGH (ref 0.0–0.2)

## 2023-07-30 LAB — PROTIME-INR
INR: 2.2 — ABNORMAL HIGH (ref 0.8–1.2)
Prothrombin Time: 24.9 s — ABNORMAL HIGH (ref 11.4–15.2)

## 2023-07-30 LAB — GLUCOSE, CAPILLARY: Glucose-Capillary: 126 mg/dL — ABNORMAL HIGH (ref 70–99)

## 2023-07-30 LAB — MAGNESIUM: Magnesium: 2.2 mg/dL (ref 1.7–2.4)

## 2023-07-30 MED ORDER — MAGNESIUM OXIDE -MG SUPPLEMENT 400 (240 MG) MG PO TABS
400.0000 mg | ORAL_TABLET | ORAL | Status: DC
Start: 2023-07-30 — End: 2023-07-30

## 2023-07-30 MED ORDER — WARFARIN SODIUM 5 MG PO TABS: 10.0000 mg | ORAL_TABLET | Freq: Once | ORAL | Status: DC

## 2023-07-30 MED ORDER — FUROSEMIDE 80 MG PO TABS: 40.0000 mg | ORAL_TABLET | Freq: Every day | ORAL | Status: DC

## 2023-07-30 NOTE — Progress Notes (Signed)
 Occupational Therapy Treatment Patient Details Name: [name] MRN: [id number] DOB: 08/16/1955 Today's Date: 07/30/2023   History of present illness Ellody Weltzin Mullally is a 68 y.o. female who presented 07/27/23 following syncopal episode at home. She stood up, felt slightly lightheaded, began to walk down the stairs, sat down and passed out. Per bystanders, did not hit her head but was mumbling for ~63mins. Found to have hypokalemic acute kidney injury. 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 Of note, recently d/c'd 5/8 for supratherapeutic INR with RLE pain and bruising.   OT comments  Pt making progress with functional goals. Pt eager to d/c home today and planning to work with [facility] therapies      If plan is discharge home, recommend the following:  A lot of help with bathing/dressing/bathroom;A little help with walking and/or transfers;Assistance with cooking/housework;Assist for transportation;Help with stairs or ramp for entrance   Equipment Recommendations  Other (comment) (reacher, sock aid, LH bath sponge)    Recommendations for Other Services      Precautions / Restrictions Precautions Precautions: Fall;Other (comment) Recall of Precautions/Restrictions: Intact Precaution/Restrictions Comments: B LE pain,watch BP, syncope Restrictions Weight Bearing Restrictions Per Provider Order: No       Mobility Bed Mobility Overal bed mobility: Needs Assistance Bed Mobility: Supine to Sit, Sit to Supine     Supine to sit: Contact guard, HOB elevated Sit to supine: Contact guard assist        Transfers Overall transfer level: Needs assistance Equipment used: Rolling walker (2 wheels) Transfers: Sit to/from Stand Sit to Stand: Contact guard assist                 Balance Overall balance assessment: History of Falls, Needs assistance Sitting-balance support: No upper extremity supported, Feet  supported Sitting balance-Leahy Scale: Good     Standing balance support: Bilateral upper extremity supported, During functional activity, Reliant on assistive device for balance Standing balance-Leahy Scale: Poor                             ADL either performed or assessed with clinical judgement   ADL Overall ADL's : Needs assistance/impaired     Grooming: Wash/dry hands;Wash/dry face;Set up;Sitting       Lower Body Bathing: Moderate assistance       Lower Body Dressing: Moderate assistance   Toilet Transfer: Contact guard assist;Ambulation;Rolling walker (2 wheels);Grab bars;Cueing for safety   Toileting- Clothing Manipulation and Hygiene: Minimal assistance;Sit to/from stand       Functional mobility during ADLs: Contact guard assist      Extremity/Trunk Assessment Upper Extremity Assessment Upper Extremity Assessment: Generalized weakness   Lower Extremity Assessment Lower Extremity Assessment: Defer to PT evaluation   Cervical / Trunk Assessment Cervical / Trunk Assessment: Other exceptions Cervical / Trunk Exceptions: Body Habitus    Vision Ability to See in Adequate Light: 0 Adequate Patient Visual Report: No change from baseline     Perception     Praxis     Communication Communication Communication: No apparent difficulties   Cognition Arousal: Alert Behavior During Therapy: [facility] for tasks assessed/performed                                 Following commands: Intact        Cueing      Exercises  Shoulder Instructions       General Comments      Pertinent Vitals/ Pain       Pain Assessment Pain Assessment: Faces Faces Pain Scale: Hurts a little bit Pain Location: B LEs/knees Pain Descriptors / Indicators: Aching, Discomfort Pain Intervention(s): Monitored during session, Repositioned  Home Living                                          Prior Functioning/Environment               Frequency  Min 2X/week        Progress Toward Goals  OT Goals(current goals can now be found in the care plan section)  Progress towards OT goals: Progressing toward goals     Plan      Co-evaluation                 AM-PAC OT "6 Clicks" Daily Activity     Outcome Measure   Help from another person eating meals?: None Help from another person taking care of personal grooming?: A Little Help from another person toileting, which includes using toliet, bedpan, or urinal?: A Little Help from another person bathing (including washing, rinsing, drying)?: A Lot Help from another person to put on and taking off regular upper body clothing?: A Little Help from another person to put on and taking off regular lower body clothing?: A Lot 6 Click Score: 17    End of Session Equipment Utilized During Treatment: Gait belt;Rolling walker (2 wheels);Other (comment) (BSC)  OT Visit Diagnosis: Unsteadiness on feet (R26.81);Muscle weakness (generalized) (M62.81)   Activity Tolerance Patient limited by fatigue   Patient Left in bed;with call bell/phone within reach   Nurse Communication Mobility status        Time: 1610-9604 OT Time Calculation (min): 16 min  Charges: OT General Charges $OT Visit: 1 Visit OT Treatments $Therapeutic Activity: 8-22 mins    Alfred Ann 07/30/2023, 1:13 PM

## 2023-07-30 NOTE — Progress Notes (Signed)
 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, recurrent DVTs)  Allergies  Allergen Reactions   Lisinopril  Anaphylaxis, Swelling and Other (See Comments)    THROAT SWELLING, Pt states her throat started to "close up" after being on it for "so long."    Prednisone  Anaphylaxis, Swelling and Other (See Comments)    Throat swelling and tongue irritation   Spironolactone  Other (See Comments)    Caused stroke-like symptoms with the mouth   Corticosteroids Rash    States she developed a rash on her tongue after a steroid injection in her back    Patient Measurements: Height: 5\' 4"  (162.6 cm) Weight: 134 kg (295 lb 6.7 oz) IBW/kg (Calculated) : 54.7 HEPARIN  DW (KG): 94.1  Vital Signs: Temp: 97.8 F (36.6 C) (05/12 0736) Temp Source: Oral (05/12 0736) BP: 121/68 (05/12 0736) Pulse Rate: 75 (05/12 0736)  Labs: Recent Labs    07/27/23 1003 07/27/23 1227 07/28/23 0506 07/28/23 0954 07/29/23 0426 07/29/23 1154 07/30/23 0303  HGB 9.8*  --  8.9* 9.4*  --   --  9.6*  HCT 31.7*  --  28.5* 30.4*  --   --  30.9*  PLT 381  --  362 364  --   --  371  LABPROT 21.2*  --  22.5*  --  25.0*  --  24.9*  INR 1.8*  --  2.0*  --  2.2*  --  2.2*  CREATININE 1.07*  --   --  0.85  --  0.75 0.85  TROPONINIHS 9 8  --   --   --   --   --     Estimated Creatinine Clearance: 86.4 mL/min (by C-G formula based on SCr of 0.85 mg/dL).   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   COPD (chronic obstructive pulmonary disease) (HCC)    inhalers dependent on environment   Depression    Diabetes mellitus    120s usually fasting -  dx more than 10 yrs ago   Dizziness, nonspecific    DVT (deep  venous thrombosis) (HCC)    Recurrent   Fibromyalgia    GERD (gastroesophageal reflux disease)    Heart murmur    History of blood transfusion    History of cocaine abuse (HCC)    Remote history    Hyperlipidemia    Hypertension    takes meds daily   Hypothyroidism    Lupus Anticoagulant Positive    On home oxygen  therapy    at night   Shortness of breath    exertion or lying flat   Sleep apnea    2l of oxygen  at night (as of 12/6, she used to)    Medications:  Scheduled:   diltiazem   240 mg Oral Daily   DULoxetine   90 mg Oral QHS   fluticasone  furoate-vilanterol  1 puff Inhalation Daily   furosemide   40 mg Oral Daily   insulin  aspart  0-15 Units Subcutaneous TID WC   insulin  aspart  0-5 Units Subcutaneous QHS   insulin  aspart  4 Units Subcutaneous TID WC   pantoprazole   40 mg Oral Daily   QUEtiapine   150 mg Oral QHS   rosuvastatin   10 mg Oral Daily  Warfarin - Pharmacist Dosing Inpatient   Does not apply q1600   zonisamide   100 mg Oral Daily   And   zonisamide   200 mg Oral QHS   Infusions:    Assessment: 69 yo female with a PMH of 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 Patient was recently hospitalized from 07/22/23-/07/26/23 at [facility] for RLE bruising with a supratherapeutic INR. During admission, patient received 7.5 mg of vitamin K on 5/5.   -INR= 2.2 and at [place]  Patient's home regimen is 10 mg MWF and 7.5 mg all other days.    [place] of Therapy:  INR 2-3 Monitor platelets by anticoagulation protocol: Yes   Plan:  Warfarin 10 mg x1 today Daily INR  Baxter Limber, PharmD Clinical Pharmacist **Pharmacist phone directory can now be found on [email or web address] ([email or web address]).  Listed under [facility].

## 2023-07-30 NOTE — TOC Initial Note (Signed)
 Transition of Care [facility]) - Initial/Assessment Note    Patient Details  Name: [name] MRN: [id number] Date of Birth: 05-Sep-1955  Transition of Care [facility]) CM/SW Contact:    Cosimo Diones, RN Phone Number: 07/30/2023, 10:32 AM  Clinical Narrative: Patient presented foe syncope. PTA patient was from home and has support of daughter. Patient is currently active with [facility] for PT/OT. Patient has resumption orders and will transition home today. [facility] is aware that the patient will transition home today. Daughter will provide transportation home. No further needs identified at this time.   Expected Discharge Plan: Home w Home Health Services Barriers to Discharge: No Barriers Identified  Patient Goals and CMS Choice Patient states their goals for this hospitalization and ongoing recovery are:: Plan to return home.  Expected Discharge Plan and Services In-house Referral: NA Discharge Planning Services: CM Consult Post Acute Care Choice: Home Health Living arrangements for the past 2 months: Apartment Expected Discharge Date: 07/30/23                  HH Arranged: PT, OT HH Agency: [facility] ([facility]) Date [facility] Agency Contacted: 07/30/23 Time HH Agency Contacted: 1031 Representative spoke with at [facility] Agency: Shelvy Dickens  Prior Living Arrangements/Services Living arrangements for the past 2 months: Apartment Lives with:: Self Patient language and need for interpreter reviewed:: Yes Do you feel safe going back to the place where you live?: Yes      Need for Family Participation in Patient Care: No (Comment) Care giver support system in place?: No (comment)   Criminal Activity/Legal Involvement Pertinent to Current Situation/Hospitalization: No - Comment as needed  Activities of Daily Living   ADL Screening (condition at time of admission) Independently performs ADLs?: No Does the patient have a NEW difficulty with  bathing/dressing/toileting/self-feeding that is expected to last >3 days?: No Does the patient have a NEW difficulty with getting in/out of bed, walking, or climbing stairs that is expected to last >3 days?: No Does the patient have a NEW difficulty with communication that is expected to last >3 days?: No Is the patient deaf or have difficulty hearing?: No Does the patient have difficulty seeing, even when wearing glasses/contacts?: No Does the patient have difficulty concentrating, remembering, or making decisions?: Yes  Permission Sought/Granted Permission sought to share information with : Family Supports, Magazine features editor, Case Estate manager/land agent granted to share information with : Yes, Verbal Permission Granted     Permission granted to share info w AGENCY: [facility]        Emotional Assessment Appearance:: Appears stated age Attitude/Demeanor/Rapport: Engaged Affect (typically observed): Appropriate Orientation: : Oriented to Self, Oriented to Place, Oriented to  Time, Oriented to Situation Alcohol / Substance Use: Not Applicable Psych Involvement: No (comment)  Admission diagnosis:  Hypokalemia [E87.6] Syncope [R55] Syncope, unspecified syncope type [R55] Patient Active Problem List   Diagnosis Date Noted   Syncope 07/27/2023   Acute blood loss anemia 07/23/2023   History of COPD 07/23/2023   DM2 (diabetes mellitus, type 2) (HCC) 03/05/2023   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   Chronic idiopathic constipation 03/05/2023   Gastro-esophageal reflux disease without esophagitis 03/05/2023   Iron  deficiency anemia 03/05/2023   Screening for malignant neoplasm of colon 03/05/2023   Generalized anxiety disorder 10/19/2022   Major depressive disorder, recurrent, moderate (HCC) 10/19/2022   Anemia of chronic disease 08/23/2022   Left knee pain 08/23/2022   HLD (hyperlipidemia) 10/07/2021  Paroxysmal SVT (supraventricular tachycardia) (HCC)  10/07/2021   Allergic rhinitis 11/29/2020   Allergic rhinitis due to pollen 11/29/2020   Moderate persistent asthma without complication 11/29/2020   Osteoarthritis of spine with radiculopathy, lumbar region 08/24/2020   Chronic back pain 08/18/2020   Obesity, diabetes, and hypertension syndrome (HCC) 02/27/2020   Vitreomacular adhesion of both eyes 02/09/2020   Pseudophakia of both eyes 02/09/2020   Chronic diastolic CHF (congestive heart failure) (HCC) 03/12/2019   Hypernatremia 03/09/2019   Suspected COVID-19 virus infection 03/09/2019   Thyroid  mass 02/27/2017   Acute pain of right lower extremity 05/24/2016   Effusion, right knee 04/28/2016   Presence of right artificial knee joint 04/28/2016   Abnormal LFTs    Decreased sensation    Paresthesia 06/12/2014   Obstruction of kidney 04/24/2014   Chronic anticoagulation 04/24/2014   Crack cocaine use 04/24/2014   Depression 04/24/2014   Fibromyalgia 04/24/2014   Obstructive sleep apnea on CPAP 04/24/2014   Lupus anticoagulant positive 04/24/2014   Adrenal mass, right (HCC) 04/24/2014   Right kidney mass 04/24/2014   Hydronephrosis of right kidney 04/24/2014   Supratherapeutic INR 04/24/2014   Renal mass 04/24/2014   Chronic obstructive pulmonary disease (HCC) 04/24/2014   Right lower quadrant abdominal pain    Morbid obesity (HCC) 08/21/2013   Constipation 08/19/2013   Lumbosacral spondylosis without myelopathy 11/19/2012   Morbid obesity with BMI of 45.0-49.9, adult (HCC) 01/05/2012   Bilateral deep vein thromboses (HCC) 12/13/2011   Hypokalemia 11/05/2011   Sciatica 11/05/2011   Essential hypertension 11/05/2011   Diabetes type 2, controlled (HCC) 11/05/2011   PCP:  Viola Greulich, MD Pharmacy:   [facility] - [address], [place] - [address] [place] [address] Phone: [phone] Fax: [phone]  [facility] [place]  - [place], [place] - [address] [place] [address] Phone: [phone] Fax: [phone]  [id number] - [place]ette Nestle, [place] - [address] AT [place] [facility] [address] [place] [address] Phone: [phone] Fax: [phone]  [facility] - [place], [place] - [address] [facility] [employer] [address] [place] [address] Phone: [phone] Fax: [phone]  Social Drivers of Health (SDOH) Social History: SDOH Screenings   Food Insecurity: No Food Insecurity (07/27/2023)  Housing: Low Risk  (07/27/2023)  Transportation Needs: No Transportation Needs (07/27/2023)  Utilities: At Risk (07/27/2023)  Alcohol Screen: Low Risk  (03/07/2023)  Depression (PHQ2-9): Medium Risk (07/11/2023)  Financial Resource Strain: Low Risk  (03/07/2023)  Physical Activity: Insufficiently Active (03/07/2023)  Social Connections: Moderately Integrated (07/27/2023)  Stress: No Stress Concern Present (03/07/2023)  Tobacco Use: Low Risk  (07/27/2023)  Health Literacy: Adequate Health Literacy (03/07/2023)   Readmission Risk Interventions    09/01/2022   11:01 AM  Readmission Risk Prevention Plan  Transportation Screening Complete  Medication Review (RN Care Manager) Complete  PCP or Specialist appointment within 3-5 days of discharge Complete  HRI or Home Care Consult Complete  SW Recovery Care/Counseling Consult Complete  Palliative Care Screening Not Applicable  Skilled Nursing Facility Complete

## 2023-07-30 NOTE — Plan of Care (Signed)

## 2023-07-30 NOTE — Discharge Summary (Signed)
 Physician Discharge Summary  [name] Fontanella ZOX:[id number] DOB: 1955/04/12 DOA: 07/27/2023  PCP: Viola Greulich, MD  Admit date: 07/27/2023 Discharge date: 07/30/2023  Admitted From: Home Disposition:  Home  Recommendations for Outpatient Follow-up:  Follow up with PCP in 1-2 weeks, will need to continue to titrate off some of her medications Please obtain BMP/CBC in one week   Home Health:No Equipment/Devices:None  Discharge Condition:Stable CODE STATUS:Full Diet recommendation: Heart Healthy   Brief/Interim Summary: 68 y.o. female with past medical history of antiphospholipid syndrome with recurrent DVT on Coumadin , diabetes mellitus type 2, chronic diastolic dysfunction recently discharged from the hospital on 07/26/2023 for supratherapeutic INR with lower extremity bruising was fine at home was going for an appointment, when she then had a syncopal episode.   Discharge Diagnoses:  Principal Problem:   Syncope Active Problems:   Hypokalemia   Essential hypertension   Morbid obesity with BMI of 45.0-49.9, adult (HCC)   Constipation   Diabetes type 2, controlled (HCC)   Chronic anticoagulation   Lupus anticoagulant positive   DM2 (diabetes mellitus, type 2) (HCC)   Chronic diastolic CHF (congestive heart failure) (HCC)  Syncope likely due to polypharmacy: Orthostatics vitals were checked which were negative. MRI of the brain showed no acute findings EEG showed no evidence of seizures. Her clonidine  and Lasix  were held her blood pressure came up. I believe the patient is confused on how she is taking her medications at home I have informed the daughter and she said she will help the patient with the medications.  Acute kidney injury: Likely hemodynamically mediated, this resolved with IV fluids.  Hypokalemia/hypomagnesemia: These were repleted orally and IV now improved.  Essential tension: Her Aldactone  was discontinued she was continued on statins and Lasix . Her  blood pressure remained relatively stable.  Severe morbid obesity: She has been counseled.  Constipation: She will continue her bowel regimen recommended MiraLAX  at home.  Diabetes mellitus type 2: No changes made to her medication should continue Invokana  metformin  and Ozempic .  Lupus anticoagulant positive: Her INR remained therapeutic.  Chronic diastolic heart failure: She will continue Lasix  at a lower dose.  Left knee pain: Likely due to osteoarthritis she relates she has had this pain for years improved with tramadol .  Discharge Instructions  Discharge Instructions     Diet - low sodium heart healthy   Complete by: As directed    Increase activity slowly   Complete by: As directed       Allergies as of 07/30/2023       Reactions   Lisinopril  Anaphylaxis, Swelling, Other (See Comments)   THROAT SWELLING, Pt states her throat started to "close up" after being on it for "so long."   Prednisone  Anaphylaxis, Swelling, Other (See Comments)   Throat swelling and tongue irritation   Spironolactone  Other (See Comments)   Caused stroke-like symptoms with the mouth   Corticosteroids Rash   States she developed a rash on her tongue after a steroid injection in her back        Medication List     STOP taking these medications    cloNIDine  0.1 MG tablet Commonly known as: CATAPRES    potassium chloride  10 MEQ CR capsule Commonly known as: MICRO-K        TAKE these medications    Accu-Chek Guide Test test strip Generic drug: glucose blood USE TO TEST BLOOD SUGAR UP TO FOUR TIMES A DAY AS DIRECTED   BLOOD GLUCOSE TEST STRIPS Strp 1 each by In  Vitro route in the morning, at noon, and at bedtime. May substitute to any manufacturer covered by patient's insurance.   Accu-Chek Guide w/Device Kit USE TO TEST BLOOD SUGAR 3 TIMES DAILY   Accu-Chek Softclix Lancets lancets USE TO TEST BLOOD SUGAR UP TO FOUR TIMES A DAY AS DIRECTED   albuterol  108 (90 Base) MCG/ACT  inhaler Commonly known as: VENTOLIN  HFA INHALE TWO (2) PUFFS BY MOUTH EVERY 4 HOURS AS NEEDED FOR SHORTNESS OF BREATH   canagliflozin  100 MG Tabs tablet Commonly known as: Invokana  Take 1 tablet (100 mg total) by mouth daily before breakfast.   diclofenac sodium 1 % Gel Commonly known as: VOLTAREN Apply 2 g topically daily as needed (fibromyalgia pain- affected sites).   diltiazem  240 MG 24 hr capsule Commonly known as: CARDIZEM  CD TAKE 1 CAPSULE BY MOUTH DAILY BEFORE BREAKFAST *REFILL REQUEST*   DULoxetine  30 MG capsule Commonly known as: CYMBALTA  Take 90 mg by mouth at bedtime.   FeroSul 325 (65 Fe) MG tablet Generic drug: ferrous sulfate  TAKE ONE TABLET BY MOUTH ONCE DAILY   furosemide  80 MG tablet Commonly known as: LASIX  Take 0.5 tablets (40 mg total) by mouth daily. What changed: See the new instructions.   gabapentin  300 MG capsule Commonly known as: NEURONTIN  TAKE ONE (1) CAPSULE BY MOUTH TWICE DAILY   Lancets Misc. Misc May substitute to any manufacturer covered by patient's insurance.   lidocaine  5 % Commonly known as: LIDODERM  APPLY 1 PATCH TOPICALLY ON THE SKIN DAILY AS NEEDED TO PAINFUL SITE *12 HOURS ON AND 12 HOURS OFF OR AS DIRECTED BY MD*   metFORMIN  500 MG tablet Commonly known as: GLUCOPHAGE  TAKE 1 TABLET BY MOUTH EVERY DAY AT BEDTIME *REFILL REQUEST*   omeprazole  20 MG capsule Commonly known as: PRILOSEC Take 1 capsule (20 mg total) by mouth 2 (two) times daily before a meal.   Ozempic  (2 MG/DOSE) 8 MG/3ML Sopn Generic drug: Semaglutide  (2 MG/DOSE) Inject 2 mg into the skin once a week.   PROCare Bariatric Briefs Misc Use as needed.   QUEtiapine  100 MG tablet Commonly known as: SEROQUEL  Take 150 mg by mouth at bedtime.   rosuvastatin  10 MG tablet Commonly known as: CRESTOR  Take 1 tablet (10 mg total) by mouth daily.   Symbicort  160-4.5 MCG/ACT inhaler Generic drug: budesonide -formoterol  INHALE TWO (2) PUFFS BY MOUTH TWICE DAILY    warfarin 7.5 MG tablet Commonly known as: COUMADIN  Take as directed. If you are unsure how to take this medication, talk to your nurse or doctor. Original instructions: TAKE 1 TABLET BY MOUTH DAILY   zonisamide  100 MG capsule Commonly known as: ZONEGRAN  TAKE 1 CAPSULE BY MOUTH EACH MORNING AND TAKE 2 CAPSULES IN THE EVENING        Allergies  Allergen Reactions   Lisinopril  Anaphylaxis, Swelling and Other (See Comments)    THROAT SWELLING, Pt states her throat started to "close up" after being on it for "so long."    Prednisone  Anaphylaxis, Swelling and Other (See Comments)    Throat swelling and tongue irritation   Spironolactone  Other (See Comments)    Caused stroke-like symptoms with the mouth   Corticosteroids Rash    States she developed a rash on her tongue after a steroid injection in her back    Consultations: None   Procedures/Studies: ECHOCARDIOGRAM COMPLETE Result Date: 07/28/2023    ECHOCARDIOGRAM REPORT   Patient Name:   [name] Date of Exam: 07/28/2023 Medical Rec #:  [id number]  Height:       64.0 in Accession #:    [id number]          Weight:       295.4 lb Date of Birth:  [birth year]           BSA:          2.309 m Patient Age:    68 years            BP:           146/82 mmHg Patient Gender: F                   HR:           88 bpm. Exam Location:  Inpatient Procedure: 2D Echo, Cardiac Doppler and Color Doppler (Both Spectral and Color            Flow Doppler were utilized during procedure). Indications:    Syncope R55  History:        Patient has prior history of Echocardiogram examinations, most                 recent 08/24/2022. CHF, COPD, Arrythmias:Tachycardia,                 Signs/Symptoms:Syncope; Risk Factors:Hypertension, Sleep Apnea,                 Diabetes and Dyslipidemia.  Sonographer:    Terrilee Few RCS Referring Phys: [phone] [facility]  Sonographer Comments: Patient is obese. Image acquisition challenging due to patient  body habitus. IMPRESSIONS  1. Left ventricular ejection fraction, by estimation, is 60 to 65%. The left ventricle has normal function. The left ventricle has no regional wall motion abnormalities. Left ventricular diastolic parameters are indeterminate.  2. Right ventricular systolic function is normal. The right ventricular size is normal. Tricuspid regurgitation signal is inadequate for assessing PA pressure.  3. The mitral valve is normal in structure. No evidence of mitral valve regurgitation. No evidence of mitral stenosis.  4. The aortic valve is normal in structure. Aortic valve regurgitation is not visualized. No aortic stenosis is present.  5. The inferior vena cava is normal in size with greater than 50% respiratory variability, suggesting right atrial pressure of 3 mmHg. FINDINGS  Left Ventricle: Left ventricular ejection fraction, by estimation, is 60 to 65%. The left ventricle has normal function. The left ventricle has no regional wall motion abnormalities. The left ventricular internal cavity size was normal in size. There is  no left ventricular hypertrophy. Left ventricular diastolic parameters are indeterminate. Right Ventricle: The right ventricular size is normal. No increase in right ventricular wall thickness. Right ventricular systolic function is normal. Tricuspid regurgitation signal is inadequate for assessing PA pressure. Left Atrium: Left atrial size was normal in size. Right Atrium: Right atrial size was normal in size. Pericardium: There is no evidence of pericardial effusion. Presence of epicardial fat layer. Mitral Valve: The mitral valve is normal in structure. No evidence of mitral valve regurgitation. No evidence of mitral valve stenosis. Tricuspid Valve: The tricuspid valve is normal in structure. Tricuspid valve regurgitation is not demonstrated. No evidence of tricuspid stenosis. Aortic Valve: The aortic valve is normal in structure. Aortic valve regurgitation is not visualized.  No aortic stenosis is present. Aortic valve peak gradient measures 14.4 mmHg. Pulmonic Valve: The pulmonic valve was normal in structure. Pulmonic valve regurgitation is not visualized. No evidence of pulmonic stenosis. Aorta: The aortic root is normal in  size and structure. Venous: The inferior vena cava is normal in size with greater than 50% respiratory variability, suggesting right atrial pressure of 3 mmHg. IAS/Shunts: No atrial level shunt detected by color flow Doppler.  LEFT VENTRICLE PLAX 2D LVIDd:         5.10 cm   Diastology LVIDs:         3.30 cm   LV e' medial:    7.72 cm/s LV PW:         1.00 cm   LV E/e' medial:  10.6 LV IVS:        0.70 cm   LV e' lateral:   8.70 cm/s LVOT diam:     2.20 cm   LV E/e' lateral: 9.4 LV SV:         75 LV SV Index:   33 LVOT Area:     3.80 cm  RIGHT VENTRICLE RV S prime:     9.70 cm/s LEFT ATRIUM           Index        RIGHT ATRIUM           Index LA diam:      3.60 cm 1.56 cm/m   RA Area:     13.20 cm LA Vol (A2C): 30.4 ml 13.17 ml/m  RA Volume:   31.50 ml  13.64 ml/m LA Vol (A4C): 59.6 ml 25.81 ml/m  AORTIC VALVE AV Area (Vmax): 2.20 cm AV Vmax:        190.00 cm/s AV Peak Grad:   14.4 mmHg LVOT Vmax:      110.00 cm/s LVOT Vmean:     72.600 cm/s LVOT VTI:       0.198 m  AORTA Ao Root diam: 3.20 cm Ao Asc diam:  3.30 cm MITRAL VALVE MV Area (PHT): 4.06 cm     SHUNTS MV Decel Time: 187 msec     Systemic VTI:  0.20 m MV E velocity: 81.80 cm/s   Systemic Diam: 2.20 cm MV A velocity: 104.00 cm/s MV E/A ratio:  0.79 Kardie Tobb DO Electronically signed by Jerryl Morin DO Signature Date/Time: 07/28/2023/3:15:34 PM    Final    MR BRAIN WO CONTRAST Result Date: 07/28/2023 CLINICAL DATA:  Initial evaluation for acute syncope/presyncope. EXAM: MRI HEAD WITHOUT CONTRAST TECHNIQUE: Multiplanar, multiecho pulse sequences of the brain and surrounding structures were obtained without intravenous contrast. COMPARISON:  CT from earlier the same day. FINDINGS: Brain: Mild  age-related cerebral atrophy. Patchy T2/FLAIR hyperintensity involving the periventricular deep white matter both cerebral hemispheres, consistent with chronic small vessel ischemic disease, mild to moderate in nature. No evidence for acute or subacute infarct. No areas of chronic cortical infarction. No acute intracranial hemorrhage. Single punctate chronic microhemorrhage noted at the right periatrial region. No mass lesion, midline shift or mass effect. No hydrocephalus or extra-axial fluid collection. Pituitary gland within normal limits. Vascular: Major intracranial vascular flow voids are maintained. Skull and upper cervical spine: Cranial junction with normal limits. Reversal of the normal upper cervical lordosis. Decreased T1 signal intensity within the visualized bone marrow, nonspecific, but most commonly related to anemia, smoking, or obesity. No scalp soft tissue abnormality. Sinuses/Orbits: Prior bilateral ocular lens replacement. Paranasal sinuses are largely clear. No significant mastoid effusion. Other: None. IMPRESSION: 1. No acute intracranial abnormality. 2. Generalized age-related cerebral atrophy with mild to moderate chronic microvascular ischemic disease. Electronically Signed   By: Virgia Griffins M.D.   On: 07/28/2023 03:26   EEG adult  Result Date: 07/27/2023 Arleene Lack, MD     07/27/2023  7:52 PM Patient Name: [name] MRN: [id number] Epilepsy Attending: Arleene Lack Referring Physician/Provider: Macdonald Savoy, MD Date: 07/27/2023 Duration: 26.50 mins Patient history: 68yo F with syncope. EEG to evaluate for seizure Level of alertness: Awake, asleep AEDs during EEG study: GBP, ZNS Technical aspects: This EEG study was done with scalp electrodes positioned according to the 10-20 International system of electrode placement. Electrical activity was reviewed with band pass filter of 1-70Hz , sensitivity of 7 uV/mm, display speed of 62mm/sec with a 60Hz  notched  filter applied as appropriate. EEG data were recorded continuously and digitally stored.  Video monitoring was available and reviewed as appropriate. Description: The posterior dominant rhythm consists of 8 Hz activity of moderate voltage (25-35 uV) seen predominantly in posterior head regions, symmetric and reactive to eye opening and eye closing. Sleep was characterized by vertex waves, sleep spindles (12 to 14 Hz), maximal frontocentral region. Hyperventilation and photic stimulation were not performed.   IMPRESSION: This study is within normal limits. No seizures or epileptiform discharges were seen throughout the recording. A normal interictal EEG does not exclude the diagnosis of epilepsy. Arleene Lack   CT Angio Chest PE W and/or Wo Contrast Result Date: 07/27/2023 CLINICAL DATA:  Pulmonary embolism suspected EXAM: CT ANGIOGRAPHY CHEST WITH CONTRAST TECHNIQUE: Multidetector CT imaging of the chest was performed using the standard protocol during bolus administration of intravenous contrast. Multiplanar CT image reconstructions and MIPs were obtained to evaluate the vascular anatomy. Multiplanar image (3D post-processing) reconstructions and MIPs were obtained to evaluate the vascular anatomy. RADIATION DOSE REDUCTION: This exam was performed according to the departmental dose-optimization program which includes automated exposure control, adjustment of the mA and/or kV according to patient size and/or use of iterative reconstruction technique. CONTRAST:  75mL OMNIPAQUE  IOHEXOL  350 MG/ML SOLN COMPARISON:  CT of the heart performed May 07, 2020 FINDINGS: Cardiovascular: Enlarged heart. No thoracic aortic aneurysm. Two vessel aortic arch. The descending thoracic aorta is grossly patent and unremarkable. Main pulmonary artery is within normal limits for diameter. There is no evidence of pulmonary embolism to the segmental level. Mediastinum/Nodes: No evidence of mediastinal lymphadenopathy. Mildly  prominent axillary lymph nodes which may be reactive. Lungs/Pleura: A 5 mm noncalcified right lower lobe pulmonary nodule is present and unchanged compared to August 27, 2022. No pleural effusion or pneumothorax. Upper Abdomen: Cholecystectomy. Unchanged right adrenal nodule measuring 3.1 cm. Small hiatal hernia. Musculoskeletal: Degenerative changes in the imaged osseous structures. Review of the MIP images confirms the above findings. IMPRESSION: 1. No evidence of pulmonary embolism to the segmental level. Electronically Signed   By: Reagan Camera M.D.   On: 07/27/2023 13:38   CT Head Wo Contrast Result Date: 07/27/2023 CLINICAL DATA:  Mental status change, unknown cause, near syncope EXAM: CT HEAD WITHOUT CONTRAST TECHNIQUE: Contiguous axial images were obtained from the base of the skull through the vertex without intravenous contrast. RADIATION DOSE REDUCTION: This exam was performed according to the departmental dose-optimization program which includes automated exposure control, adjustment of the mA and/or kV according to patient size and/or use of iterative reconstruction technique. COMPARISON:  August 23, 2022 FINDINGS: Brain: The ventricles appear age appropriate. No mass effect or midline shift. Gray-white differentiation is preserved. Scattered periventricular white matter hypoattenuation, most consistent with changes of mild chronic ischemic microvascular disease. 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 No evidence of acute territorial infarction, extra-axial fluid collection, hemorrhage, or  mass lesion. The sella and pituitary are within normal limits. The basilar cisterns are patent without downward herniation. The cerebellar hemispheres and vermis are well formed without mass lesion or focal attenuation abnormality. Vascular: No hyperdense vessel. Calcified atherosclerotic plaque within the cavernous/supraclinoid internal carotid arteries. Skull: Normal. Negative for fracture or focal  lesion. Sinuses/Orbits: The paranasal sinuses and mastoids are clear. The globes appear intact. No retrobulbar hematoma. Bilateral lens replacements. Other: None. IMPRESSION: 1. No acute intracranial abnormality, specifically, no acute hemorrhage, territorial infarction, or intracranial mass. 2. Sequelae of mild, chronic ischemic microvascular disease with chronic lacunar infarct in the left basal ganglia, unchanged. Electronically Signed   By: Rance Burrows M.D.   On: 07/27/2023 13:14   DG Chest Portable 1 View Result Date: 07/27/2023 CLINICAL DATA:  Altered mental status. EXAM: PORTABLE CHEST 1 VIEW COMPARISON:  07/22/2023. FINDINGS: Low lung volume. Bilateral lung fields are clear. Bilateral costophrenic angles are clear. Elevated right hemidiaphragm noted. Stable mildly enlarged cardio-mediastinal silhouette. No acute osseous abnormalities. Lower cervical spinal fixation hardware noted. The soft tissues are within normal limits. IMPRESSION: *No active disease. Electronically Signed   By: Beula Brunswick M.D.   On: 07/27/2023 10:28   VAS US  LOWER EXTREMITY VENOUS (DVT) Result Date: 07/25/2023  Lower Venous DVT Study Patient Name:  [name]  Date of Exam:   07/25/2023 Medical Rec #: [id number]            Accession #:    [id number] Date of Birth: 12-29-1955            Patient Gender: F Patient Age:   61 years Exam Location:  [facility] Procedure:      VAS US  LOWER EXTREMITY VENOUS (DVT) Referring Phys: Aisha Hove --------------------------------------------------------------------------------  Indications: Pain.  Risk Factors: DVT Hx obesity. Limitations: Body habitus. Comparison Study: No significant changes seen since previous exam 04/25/21. Performing Technologist: Estanislao Heimlich  Examination Guidelines: A complete evaluation includes B-mode imaging, spectral Doppler, color Doppler, and power Doppler as needed of all accessible portions of each vessel. Bilateral testing is considered  an integral part of a complete examination. Limited examinations for reoccurring indications may be performed as noted. The reflux portion of the exam is performed with the patient in reverse Trendelenburg.  +---------+---------------+---------+-----------+----------+--------------+ RIGHT    CompressibilityPhasicitySpontaneityPropertiesThrombus Aging +---------+---------------+---------+-----------+----------+--------------+ CFV      Full           Yes      Yes                                 +---------+---------------+---------+-----------+----------+--------------+ SFJ      Full                                                        +---------+---------------+---------+-----------+----------+--------------+ FV Prox  Full                                                        +---------+---------------+---------+-----------+----------+--------------+ FV Mid   Full                                                        +---------+---------------+---------+-----------+----------+--------------+  FV DistalFull                                                        +---------+---------------+---------+-----------+----------+--------------+ PFV      Full                                                        +---------+---------------+---------+-----------+----------+--------------+ POP      Full           Yes      Yes                                 +---------+---------------+---------+-----------+----------+--------------+ PTV      Full                    Yes                                 +---------+---------------+---------+-----------+----------+--------------+ PERO     Full                    Yes                                 +---------+---------------+---------+-----------+----------+--------------+   +---------+---------------+---------+-----------+----------+--------------+ LEFT      CompressibilityPhasicitySpontaneityPropertiesThrombus Aging +---------+---------------+---------+-----------+----------+--------------+ CFV      Full           Yes      Yes                                 +---------+---------------+---------+-----------+----------+--------------+ SFJ      Full                                                        +---------+---------------+---------+-----------+----------+--------------+ FV Prox  Full                                                        +---------+---------------+---------+-----------+----------+--------------+ FV Mid   Full                                                        +---------+---------------+---------+-----------+----------+--------------+ FV DistalFull                                                        +---------+---------------+---------+-----------+----------+--------------+  PFV      Full                                                        +---------+---------------+---------+-----------+----------+--------------+ POP      Full           Yes      Yes                                 +---------+---------------+---------+-----------+----------+--------------+ PTV      Full                    Yes                                 +---------+---------------+---------+-----------+----------+--------------+ PERO     Full                    Yes                                 +---------+---------------+---------+-----------+----------+--------------+     Summary: BILATERAL: - No evidence of deep vein thrombosis seen in the lower extremities, bilaterally. -No evidence of popliteal cyst, bilaterally.   *See table(s) above for measurements and observations. Electronically signed by Genny Kid MD on 07/25/2023 at 5:30:40 PM.    Final    DG Chest Portable 1 View Result Date: 07/22/2023 CLINICAL DATA:  Bruising right shoulder and shin. EXAM: PORTABLE CHEST 1 VIEW COMPARISON:   08/23/2022 FINDINGS: The heart size and mediastinal contours are within normal limits. Both lungs are clear. The visualized skeletal structures are unremarkable. IMPRESSION: No active disease. Electronically Signed   By: Janeece Mechanic M.D.   On: 07/22/2023 21:10   DG Tibia/Fibula Right Result Date: 07/22/2023 CLINICAL DATA:  Right shin bruising. EXAM: RIGHT TIBIA AND FIBULA - 2 VIEW COMPARISON:  None Available. FINDINGS: Prior right knee replacement. No visible hardware complicating feature. No acute bony abnormality. Specifically, no fracture, subluxation, or dislocation. Soft tissue swelling in the anterior shin. IMPRESSION: No acute bony abnormality. Electronically Signed   By: Janeece Mechanic M.D.   On: 07/22/2023 21:09   DG Shoulder Right Result Date: 07/22/2023 CLINICAL DATA:  Right shoulder bruising. EXAM: RIGHT SHOULDER - 2+ VIEW COMPARISON:  None Available. FINDINGS: Degenerative changes in the [place] [facility] joint with joint space narrowing and spurring. Glenohumeral joint is maintained. No acute bony abnormality. Specifically, no fracture, subluxation, or dislocation. Soft tissues are intact. IMPRESSION: Degenerative changes in the right AC joint. No acute bony abnormality. Electronically Signed   By: Janeece Mechanic M.D.   On: 07/22/2023 21:08   (Echo, Carotid, EGD, Colonoscopy, ERCP)    Subjective: No complaints  Discharge Exam: Vitals:   07/30/23 0736 07/30/23 0758  BP: 121/68   Pulse: 75   Resp: 16   Temp: 97.8 F (36.6 C)   SpO2: 100% 100%   Vitals:   07/29/23 2330 07/30/23 0319 07/30/23 0736 07/30/23 0758  BP: (!) 150/87 137/71 121/68   Pulse: 73 72 75   Resp: 16 18 16    Temp: 98.2 F (36.8 C) 97.8 F (36.6 C) 97.8 F (36.6 C)  TempSrc: Oral Oral Oral   SpO2: 95% 97% 100% 100%  Weight:      Height:        General: Pt is alert, awake, not in acute distress Cardiovascular: RRR, S1/S2 +, no rubs, no gallops Respiratory: CTA bilaterally, no wheezing, no rhonchi Abdominal: Soft, NT,  ND, bowel sounds + Extremities: no edema, no cyanosis    The results of significant diagnostics from this hospitalization (including imaging, microbiology, ancillary and laboratory) are listed below for reference.     Microbiology: Recent Results (from the past 240 hours)  Resp panel by RT-PCR (RSV, Flu A&B, Covid) Anterior Nasal Swab     Status: None   Collection Time: 07/27/23 10:07 AM   Specimen: Anterior Nasal Swab  Result Value Ref Range Status   SARS Coronavirus 2 by RT PCR NEGATIVE NEGATIVE Final   Influenza A by PCR NEGATIVE NEGATIVE Final   Influenza B by PCR NEGATIVE NEGATIVE Final    Comment: (NOTE) The Xpert Xpress SARS-CoV-2/FLU/RSV plus assay is intended as an aid in the diagnosis of influenza from Nasopharyngeal swab specimens and should not be used as a sole basis for treatment. Nasal washings and aspirates are unacceptable for Xpert Xpress SARS-CoV-2/FLU/RSV testing.  Fact Sheet for Patients: BloggerCourse.com  Fact Sheet for Healthcare Providers: SeriousBroker.it  This test is not yet approved or cleared by the [place]  FDA and has been authorized for detection and/or diagnosis of SARS-CoV-2 by FDA under an Emergency Use Authorization (EUA). This EUA will remain in effect (meaning this test can be used) for the duration of the COVID-19 declaration under Section 564(b)(1) of the Act, 21 U.S.C. section 360bbb-3(b)(1), unless the authorization is terminated or revoked.     Resp Syncytial Virus by PCR NEGATIVE NEGATIVE Final    Comment: (NOTE) Fact Sheet for Patients: BloggerCourse.com  Fact Sheet for Healthcare Providers: SeriousBroker.it  This test is not yet approved or cleared by the [place]  FDA and has been authorized for detection and/or diagnosis of SARS-CoV-2 by FDA under an Emergency Use Authorization (EUA). This EUA will remain in  effect (meaning this test can be used) for the duration of the COVID-19 declaration under Section 564(b)(1) of the Act, 21 U.S.C. section 360bbb-3(b)(1), unless the authorization is terminated or revoked.  Performed at [facility], [address] [place], [place] [address]      Labs: BNP (last 3 results) Recent Labs    07/22/23 2102 07/27/23 1003  BNP 39.4 19.4   Basic Metabolic Panel: Recent Labs  Lab 07/27/23 1003 07/27/23 1641 07/28/23 0954 07/29/23 0426 07/29/23 1154 07/30/23 0303  NA 137  --  138  --  137 135  K 3.4*  --  4.1  --  4.1 4.1  CL 103  --  107  --  103 100  CO2 23  --  21*  --  25 26  GLUCOSE 219*  --  134*  --  129* 121*  BUN 20  --  17  --  12 13  CREATININE 1.07*  --  0.85  --  0.75 0.85  CALCIUM  8.6*  --  8.8*  --  9.1 8.7*  MG  --  1.6*  --  1.6*  --   --    Liver Function Tests: Recent Labs  Lab 07/27/23 1003  AST 20  ALT 18  ALKPHOS 68  BILITOT 0.5  PROT 7.3  ALBUMIN  3.2*   No results for input(s): "LIPASE", "AMYLASE" in the last 168  hours. No results for input(s): "AMMONIA" in the last 168 hours. CBC: Recent Labs  Lab 07/26/23 0602 07/27/23 1003 07/28/23 0506 07/28/23 0954 07/30/23 0303  WBC 6.7 6.5 7.9 7.3 6.3  NEUTROABS  --  3.5  --   --   --   HGB 10.3* 9.8* 8.9* 9.4* 9.6*  HCT 33.6* 31.7* 28.5* 30.4* 30.9*  MCV 86.2 85.4 84.3 84.2 85.1  PLT 371 381 362 364 371   Cardiac Enzymes: No results for input(s): "CKTOTAL", "CKMB", "CKMBINDEX", "TROPONINI" in the last 168 hours. BNP: Invalid input(s): "POCBNP" CBG: Recent Labs  Lab 07/29/23 1249 07/29/23 1311 07/29/23 1732 07/29/23 2126 07/30/23 0739  GLUCAP 172* 180* 135* 133* 126*   D-Dimer No results for input(s): "DDIMER" in the last 72 hours. Hgb A1c Recent Labs    07/28/23 0954  HGBA1C 6.5*   Lipid Profile No results for input(s): "CHOL", "HDL", "LDLCALC", "TRIG", "CHOLHDL", "LDLDIRECT" in the last 72 hours. Thyroid  function studies No results  for input(s): "TSH", "T4TOTAL", "T3FREE", "THYROIDAB" in the last 72 hours.  Invalid input(s): "FREET3" Anemia work up Recent Labs    07/28/23 0954  VITAMINB12 275  FOLATE 17.5  FERRITIN 62  TIBC 322  IRON  43  RETICCTPCT 2.8   Urinalysis    Component Value Date/Time   COLORURINE YELLOW 07/29/2023 2031   APPEARANCEUR CLEAR 07/29/2023 2031   LABSPEC 1.012 07/29/2023 2031   PHURINE 5.0 07/29/2023 2031   GLUCOSEU NEGATIVE 07/29/2023 2031   GLUCOSEU NEGATIVE 03/27/2022 1557   HGBUR NEGATIVE 07/29/2023 2031   BILIRUBINUR NEGATIVE 07/29/2023 2031   BILIRUBINUR neg 04/04/2023 1515   KETONESUR NEGATIVE 07/29/2023 2031   PROTEINUR NEGATIVE 07/29/2023 2031   UROBILINOGEN 0.2 04/04/2023 1515   UROBILINOGEN 0.2 03/27/2022 1557   NITRITE NEGATIVE 07/29/2023 2031   LEUKOCYTESUR NEGATIVE 07/29/2023 2031   Sepsis Labs Recent Labs  Lab 07/27/23 1003 07/28/23 0506 07/28/23 0954 07/30/23 0303  WBC 6.5 7.9 7.3 6.3   Microbiology Recent Results (from the past 240 hours)  Resp panel by RT-PCR (RSV, Flu A&B, Covid) Anterior Nasal Swab     Status: None   Collection Time: 07/27/23 10:07 AM   Specimen: Anterior Nasal Swab  Result Value Ref Range Status   SARS Coronavirus 2 by RT PCR NEGATIVE NEGATIVE Final   Influenza A by PCR NEGATIVE NEGATIVE Final   Influenza B by PCR NEGATIVE NEGATIVE Final    Comment: (NOTE) The Xpert Xpress SARS-CoV-2/FLU/RSV plus assay is intended as an aid in the diagnosis of influenza from Nasopharyngeal swab specimens and should not be used as a sole basis for treatment. Nasal washings and aspirates are unacceptable for Xpert Xpress SARS-CoV-2/FLU/RSV testing.  Fact Sheet for Patients: BloggerCourse.com  Fact Sheet for Healthcare Providers: SeriousBroker.it  This test is not yet approved or cleared by the [place]  FDA and has been authorized for detection and/or diagnosis of SARS-CoV-2 by FDA under  an Emergency Use Authorization (EUA). This EUA will remain in effect (meaning this test can be used) for the duration of the COVID-19 declaration under Section 564(b)(1) of the Act, 21 U.S.C. section 360bbb-3(b)(1), unless the authorization is terminated or revoked.     Resp Syncytial Virus by PCR NEGATIVE NEGATIVE Final    Comment: (NOTE) Fact Sheet for Patients: BloggerCourse.com  Fact Sheet for Healthcare Providers: SeriousBroker.it  This test is not yet approved or cleared by the [place]  FDA and has been authorized for detection and/or diagnosis of SARS-CoV-2 by FDA under an Emergency Use Authorization (EUA). This  EUA will remain in effect (meaning this test can be used) for the duration of the COVID-19 declaration under Section 564(b)(1) of the Act, 21 U.S.C. section 360bbb-3(b)(1), unless the authorization is terminated or revoked.  Performed at [facility], [address] [place], [place] [address]      Time coordinating discharge: Over 35 minutes  SIGNED:   Macdonald Savoy, MD  [facility] 07/30/2023, 10:08 AM Pager   If 7PM-7AM, please contact night-coverage [email or web address] Password [email or web address]

## 2023-07-30 NOTE — Progress Notes (Signed)
 Went over discharge paper work with patient. All questions answered. PIV/telemetry removed. All belonging at bedside.

## 2023-08-01 ENCOUNTER — Telehealth: Payer: Self-pay

## 2023-08-01 DIAGNOSIS — D62 Acute posthemorrhagic anemia: Secondary | ICD-10-CM | POA: Diagnosis not present

## 2023-08-01 DIAGNOSIS — E119 Type 2 diabetes mellitus without complications: Secondary | ICD-10-CM | POA: Diagnosis not present

## 2023-08-01 DIAGNOSIS — D131 Benign neoplasm of stomach: Secondary | ICD-10-CM | POA: Diagnosis not present

## 2023-08-01 DIAGNOSIS — I5032 Chronic diastolic (congestive) heart failure: Secondary | ICD-10-CM | POA: Diagnosis not present

## 2023-08-01 DIAGNOSIS — I110 Hypertensive heart disease with heart failure: Secondary | ICD-10-CM | POA: Diagnosis not present

## 2023-08-01 DIAGNOSIS — J449 Chronic obstructive pulmonary disease, unspecified: Secondary | ICD-10-CM | POA: Diagnosis not present

## 2023-08-01 NOTE — Telephone Encounter (Signed)
 Copied from CRM [id number]. Topic: Clinical - Home Health Verbal Orders >> Aug 01, 2023 12:05 PM Bambi Bonine D wrote: Caller/Agency: Joesphine Must Number: [phone] Service Requested: Physical Therapy Frequency: 1 week 1, 2nd week 3 , 1 week 3 Any new concerns about the patient? No

## 2023-08-01 NOTE — Telephone Encounter (Signed)
 Ok

## 2023-08-02 ENCOUNTER — Ambulatory Visit
Admission: RE | Admit: 2023-08-02 | Discharge: 2023-08-02 | Disposition: A | Discharge status: Home / Self Care | Source: Ambulatory Visit | Attending: Family Medicine | Admitting: Family Medicine

## 2023-08-02 DIAGNOSIS — D3501 Benign neoplasm of right adrenal gland: Secondary | ICD-10-CM | POA: Diagnosis not present

## 2023-08-02 DIAGNOSIS — K760 Fatty (change of) liver, not elsewhere classified: Secondary | ICD-10-CM | POA: Diagnosis not present

## 2023-08-02 DIAGNOSIS — E278 Other specified disorders of adrenal gland: Secondary | ICD-10-CM

## 2023-08-02 MED ORDER — GADOPICLENOL 0.5 MMOL/ML IV SOLN
10.0000 mL | Freq: Once | INTRAVENOUS | Status: AC | PRN
  Administered 2023-08-02: 10 mL via INTRAVENOUS

## 2023-08-02 NOTE — Telephone Encounter (Signed)
 Called Monique with [employer] left a VM for VO for P.T.

## 2023-08-06 DIAGNOSIS — J449 Chronic obstructive pulmonary disease, unspecified: Secondary | ICD-10-CM | POA: Diagnosis not present

## 2023-08-06 DIAGNOSIS — I110 Hypertensive heart disease with heart failure: Secondary | ICD-10-CM | POA: Diagnosis not present

## 2023-08-06 DIAGNOSIS — D62 Acute posthemorrhagic anemia: Secondary | ICD-10-CM | POA: Diagnosis not present

## 2023-08-06 DIAGNOSIS — E119 Type 2 diabetes mellitus without complications: Secondary | ICD-10-CM | POA: Diagnosis not present

## 2023-08-06 DIAGNOSIS — I5032 Chronic diastolic (congestive) heart failure: Secondary | ICD-10-CM | POA: Diagnosis not present

## 2023-08-06 DIAGNOSIS — D131 Benign neoplasm of stomach: Secondary | ICD-10-CM | POA: Diagnosis not present

## 2023-08-07 ENCOUNTER — Telehealth: Payer: Self-pay

## 2023-08-07 ENCOUNTER — Ambulatory Visit: Payer: Self-pay | Admitting: Family Medicine

## 2023-08-07 DIAGNOSIS — I5032 Chronic diastolic (congestive) heart failure: Secondary | ICD-10-CM | POA: Diagnosis not present

## 2023-08-07 DIAGNOSIS — E119 Type 2 diabetes mellitus without complications: Secondary | ICD-10-CM | POA: Diagnosis not present

## 2023-08-07 DIAGNOSIS — J449 Chronic obstructive pulmonary disease, unspecified: Secondary | ICD-10-CM | POA: Diagnosis not present

## 2023-08-07 DIAGNOSIS — D131 Benign neoplasm of stomach: Secondary | ICD-10-CM | POA: Diagnosis not present

## 2023-08-07 DIAGNOSIS — I110 Hypertensive heart disease with heart failure: Secondary | ICD-10-CM | POA: Diagnosis not present

## 2023-08-07 DIAGNOSIS — D62 Acute posthemorrhagic anemia: Secondary | ICD-10-CM | POA: Diagnosis not present

## 2023-08-07 NOTE — Telephone Encounter (Signed)
 Copied from CRM [id number]. Topic: Clinical - Home Health Verbal Orders >> Aug 07, 2023  2:37 PM Keitha Pata L wrote: Caller/Agency: [facility] home health Callback Number: [phone] Service Requested: Occupational Therapy Frequency: 1x week for 2 weeks, 2x a week for 2 weeks, 1x for 2 weeks Any new concerns about the patient? No

## 2023-08-08 ENCOUNTER — Telehealth: Payer: Self-pay

## 2023-08-08 ENCOUNTER — Ambulatory Visit: Payer: Self-pay

## 2023-08-08 DIAGNOSIS — I5032 Chronic diastolic (congestive) heart failure: Secondary | ICD-10-CM | POA: Diagnosis not present

## 2023-08-08 DIAGNOSIS — J449 Chronic obstructive pulmonary disease, unspecified: Secondary | ICD-10-CM | POA: Diagnosis not present

## 2023-08-08 DIAGNOSIS — E119 Type 2 diabetes mellitus without complications: Secondary | ICD-10-CM | POA: Diagnosis not present

## 2023-08-08 DIAGNOSIS — D62 Acute posthemorrhagic anemia: Secondary | ICD-10-CM | POA: Diagnosis not present

## 2023-08-08 DIAGNOSIS — D131 Benign neoplasm of stomach: Secondary | ICD-10-CM | POA: Diagnosis not present

## 2023-08-08 DIAGNOSIS — I110 Hypertensive heart disease with heart failure: Secondary | ICD-10-CM | POA: Diagnosis not present

## 2023-08-08 NOTE — Telephone Encounter (Signed)
 Copied from CRM [id number]. Topic: Clinical - Prescription Issue >> Aug 08, 2023  2:07 PM Freya Jesus wrote: Reason for CRM: Patient is requesting a prescription for incontinence supplies from [facility] [phone])

## 2023-08-08 NOTE — Telephone Encounter (Signed)
 Okay

## 2023-08-08 NOTE — Telephone Encounter (Signed)
 Pt notes she has been calling supply company and she has been advised orders have not been received. Pt is requesting new orders for incontinence supplies be sent to [facility]. Routing to clinic for follow up. Reason for Disposition . [1] Follow-up call from patient regarding patient's clinical status AND [2] information NON-URGENT  Answer Assessment - Initial Assessment Questions 1. REASON FOR CALL or QUESTION: "What is your reason for calling today?" or "How can I best help you?" or "What question do you have that I can help answer?"     Pt needing new orders 2. CALLER: Document the source of call. (e.g., laboratory, patient).     Patient  Protocols used: PCP Call - No Triage-A-AH

## 2023-08-08 NOTE — Telephone Encounter (Signed)
 Ok

## 2023-08-08 NOTE — Telephone Encounter (Signed)
 Called and left a VM giving the okay for VO for O.T. per Dr. Arliss Lam

## 2023-08-08 NOTE — Telephone Encounter (Signed)
 Orders have been sent 5/21

## 2023-08-08 NOTE — Telephone Encounter (Signed)
 Copied from CRM [id number]. Topic: Clinical - Home Health Verbal Orders >> Aug 08, 2023  1:55 PM Freya Jesus wrote: Caller/Agency: Josiah Nigh [facility] Callback Number: [phone] (secured voicemail) Service Requested: Skilled Nursing Frequency: 1 week 1/ 2 week 2 / 1 week 3 Any new concerns about the patient? Yes, went to hospital today. Still having symptoms of anemia. When she sits her blood pressure is high but when she stands it drops low, high fall risk, high risk for re-hospitalization. Need her to be monitored by nursing so she dose not end up in the hospital.

## 2023-08-09 DIAGNOSIS — J449 Chronic obstructive pulmonary disease, unspecified: Secondary | ICD-10-CM | POA: Diagnosis not present

## 2023-08-09 DIAGNOSIS — I5032 Chronic diastolic (congestive) heart failure: Secondary | ICD-10-CM | POA: Diagnosis not present

## 2023-08-09 DIAGNOSIS — D131 Benign neoplasm of stomach: Secondary | ICD-10-CM | POA: Diagnosis not present

## 2023-08-09 DIAGNOSIS — E119 Type 2 diabetes mellitus without complications: Secondary | ICD-10-CM | POA: Diagnosis not present

## 2023-08-09 DIAGNOSIS — I110 Hypertensive heart disease with heart failure: Secondary | ICD-10-CM | POA: Diagnosis not present

## 2023-08-09 DIAGNOSIS — D62 Acute posthemorrhagic anemia: Secondary | ICD-10-CM | POA: Diagnosis not present

## 2023-08-09 NOTE — Telephone Encounter (Signed)
 Called Margo from [place]  a left a VM giving VO for Skilled Nursing

## 2023-08-12 IMAGING — CR DG CHEST 2V
2 series · 2 of 2 positions shown · non-contrast
Comparison: February 02, 2021.

CLINICAL DATA: Shortness of breath.

EXAM:
CHEST - 2 VIEW

[chest pa]
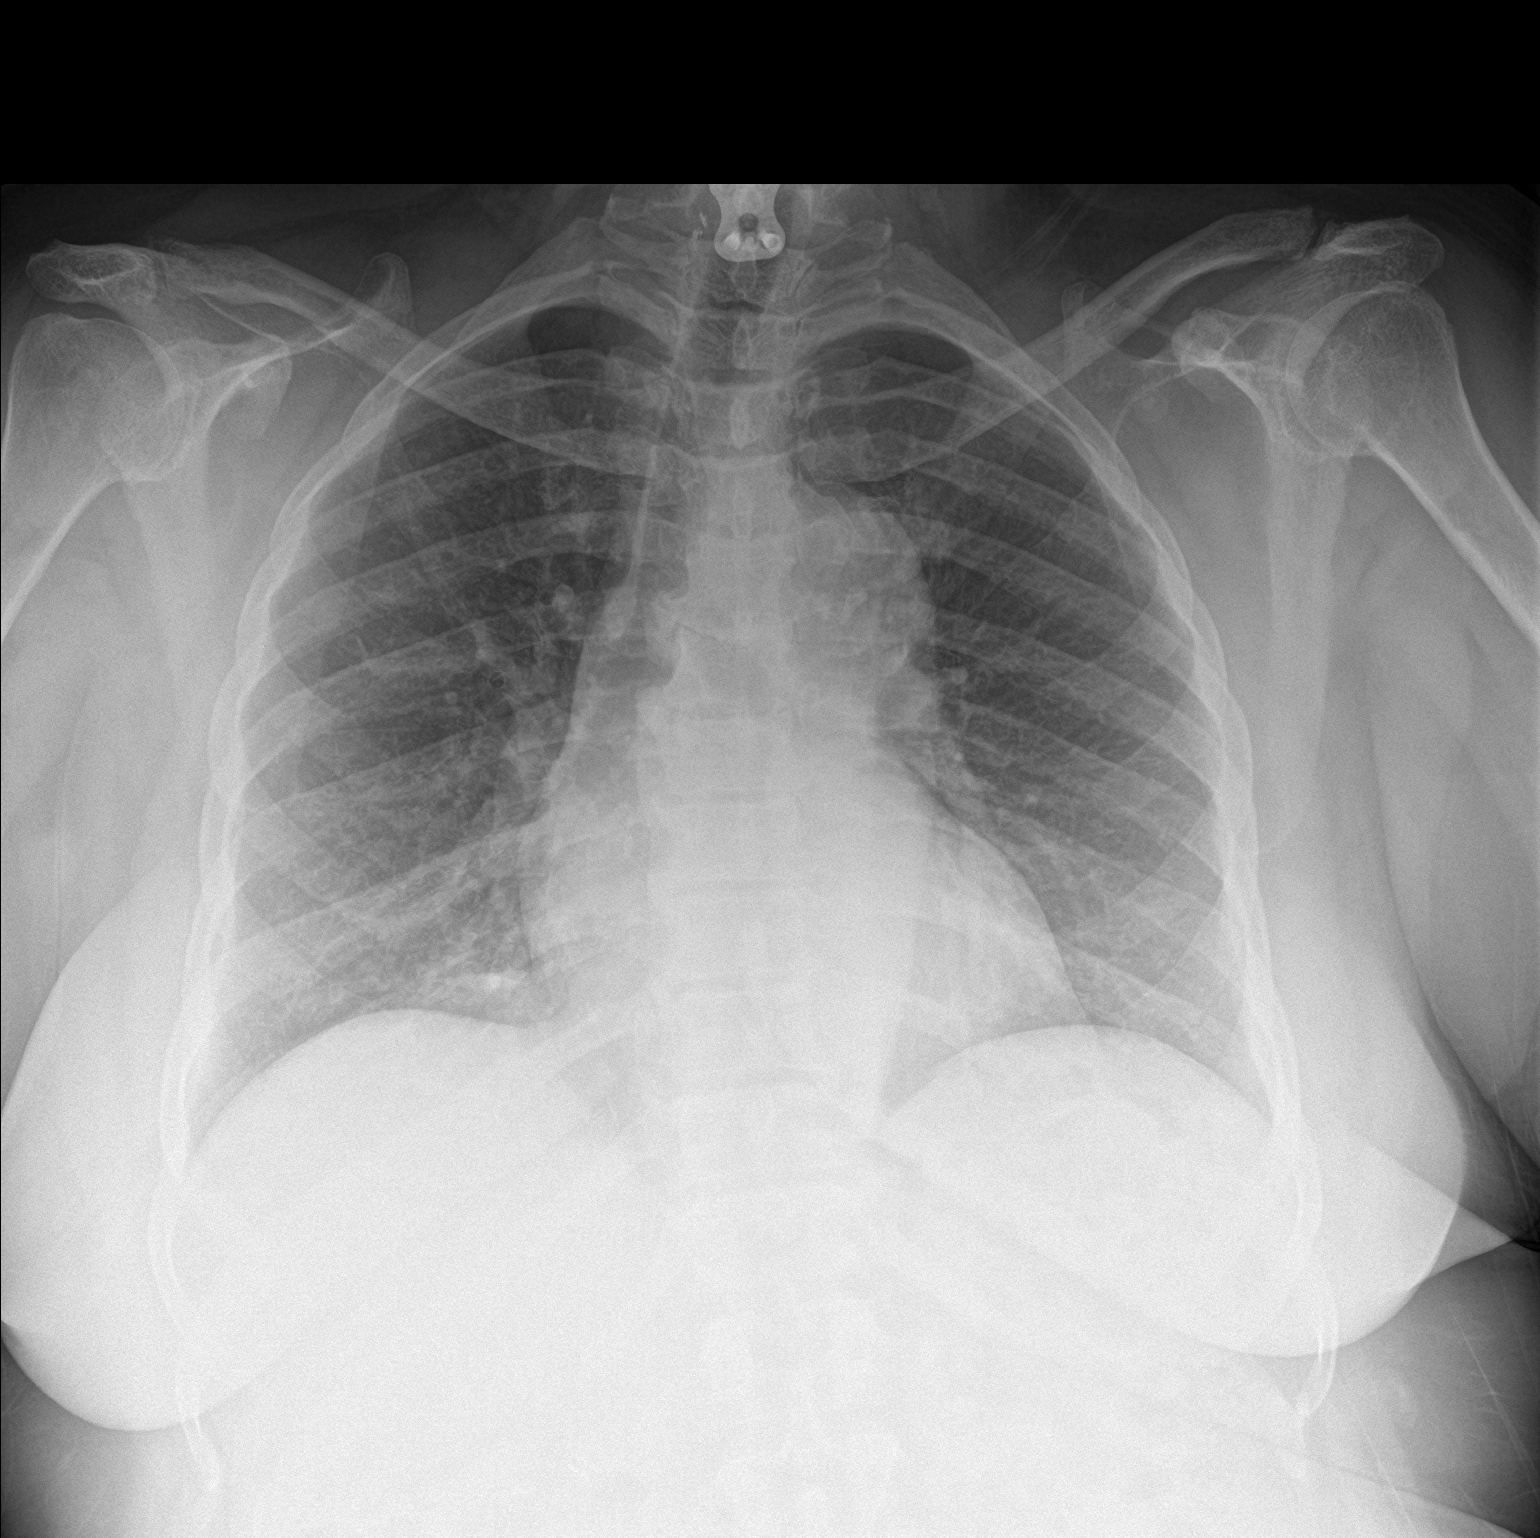

[chest lat]
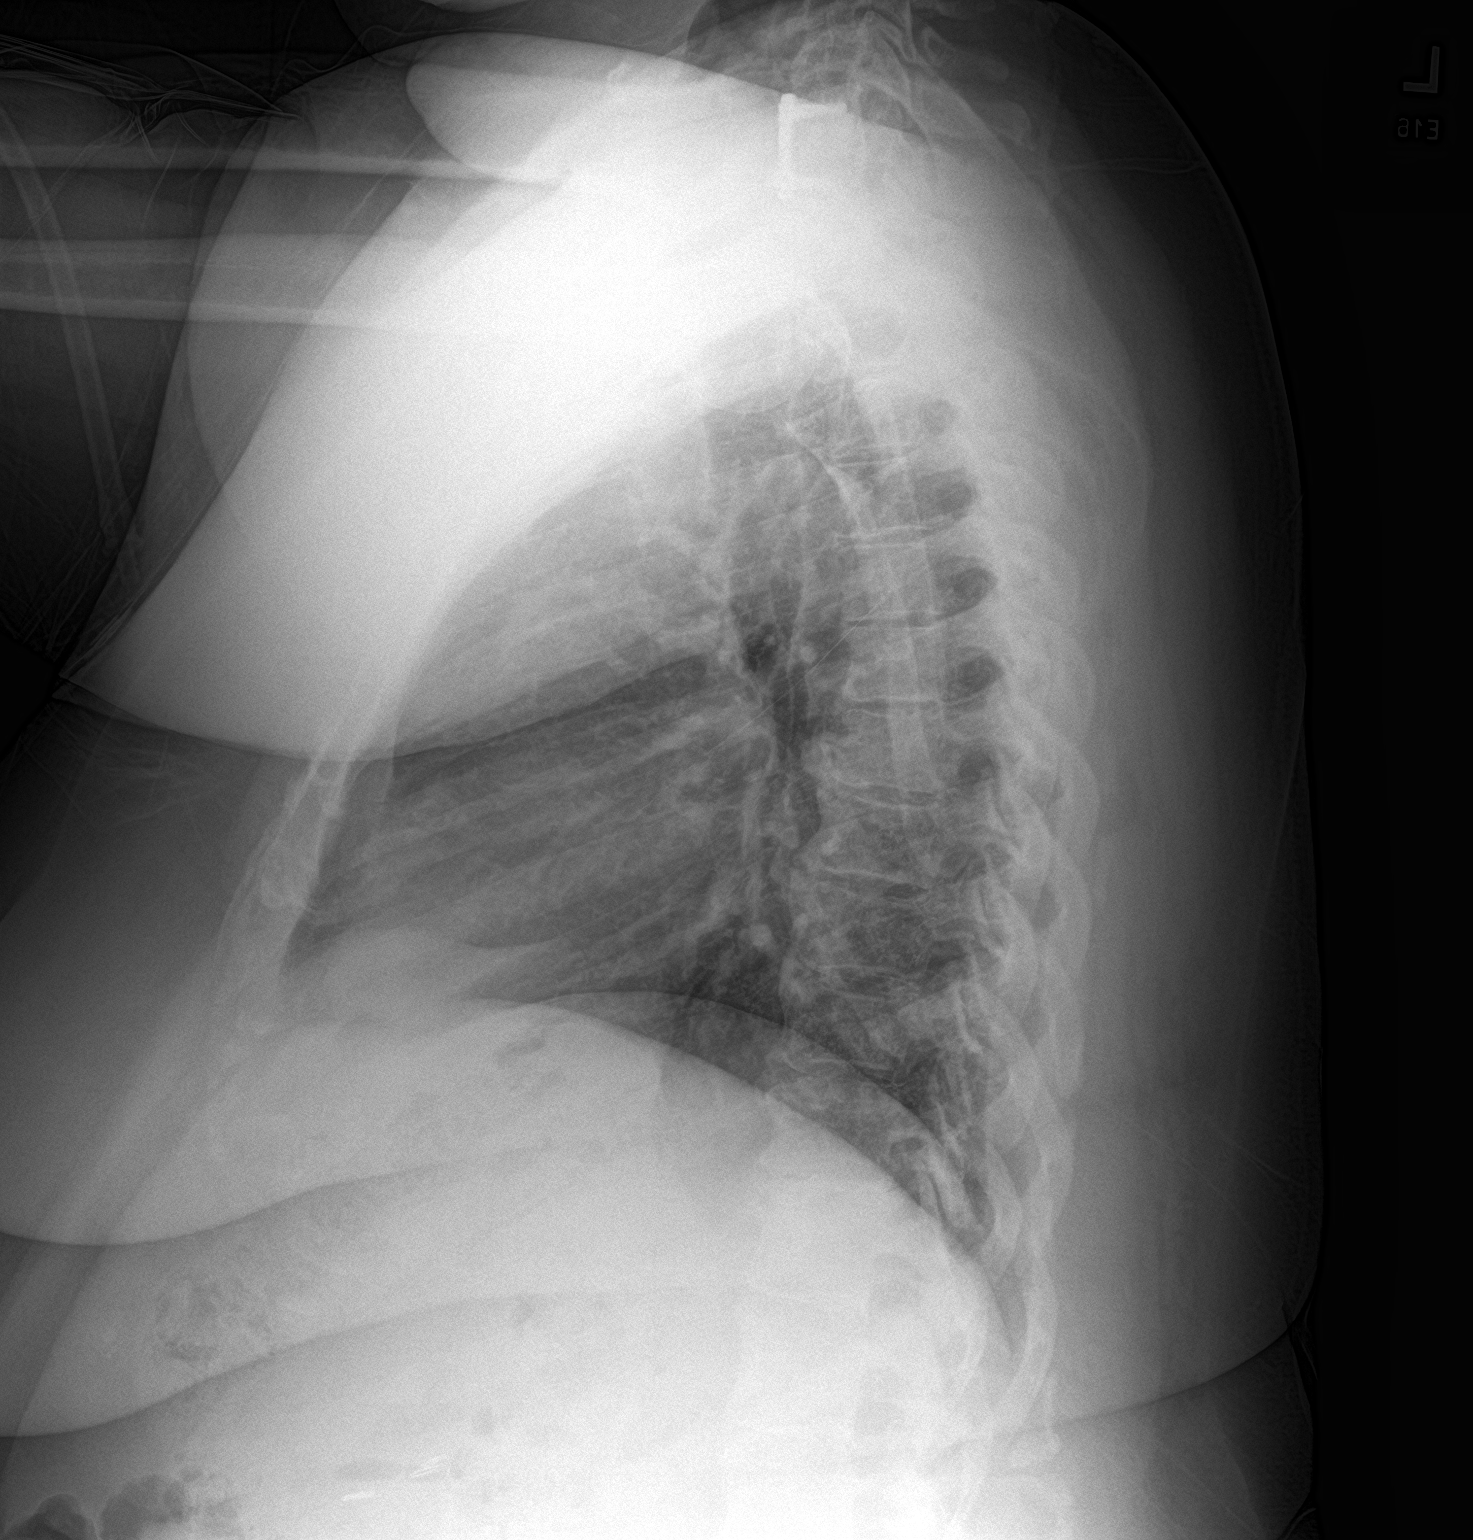

[2 of 2 positions shown; findings below may reference images not displayed]

FINDINGS: Stable cardiomediastinal silhouette. Both lungs are clear. The
visualized skeletal structures are unremarkable.
IMPRESSION: No active cardiopulmonary disease.

## 2023-08-12 IMAGING — CT CT HEAD W/O CM
4 series · 15 of 47 positions shown, 17 images · non-contrast
Comparison: MRI 03/10/2019, CT brain 03/09/2019

CLINICAL DATA: Weakness headache

EXAM:
CT HEAD WITHOUT CONTRAST
TECHNIQUE: Contiguous axial images were obtained from the base of the skull
through the vertex without intravenous contrast.

[Series 3: head without · axial · non-contrast · 0.46mm/px · z∈[-118,+2]mm · 7 of 33 slices shown, 9 images]
[im 5/33  brain]
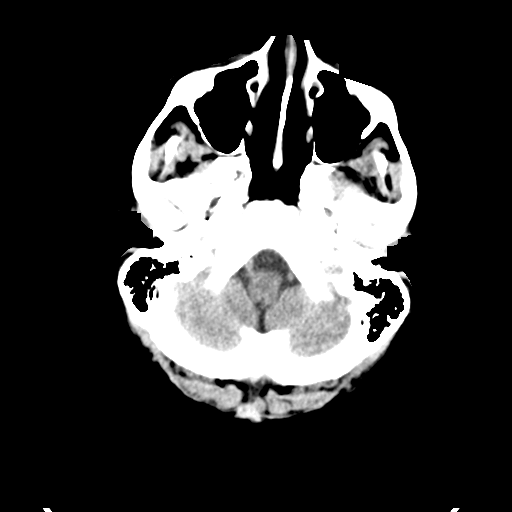
[im 5/33  bone]
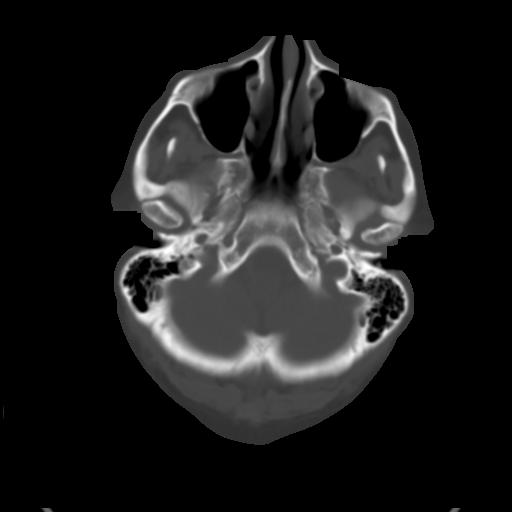
[im 9/33  brain]
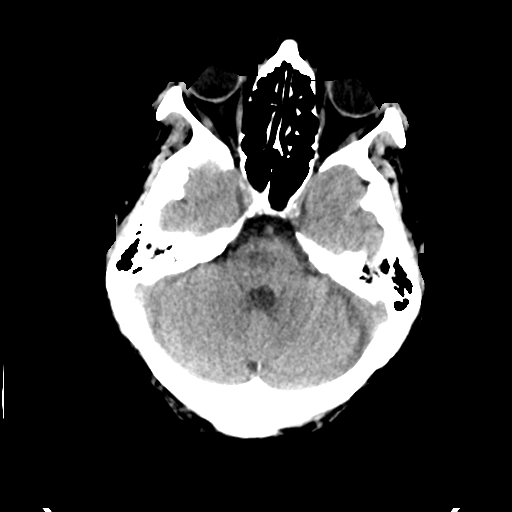
[im 13/33  brain]
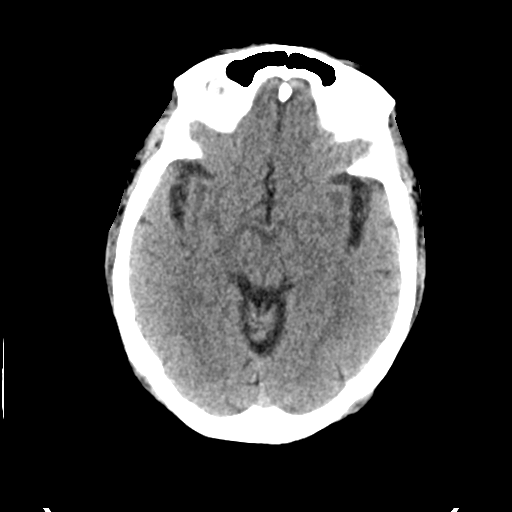
[im 17/33  brain]
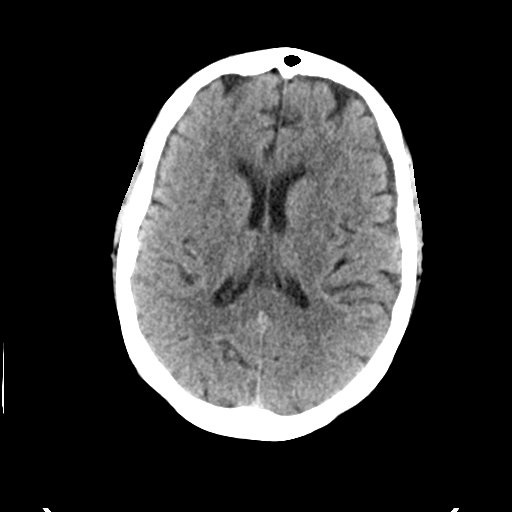
[im 21/33  brain]
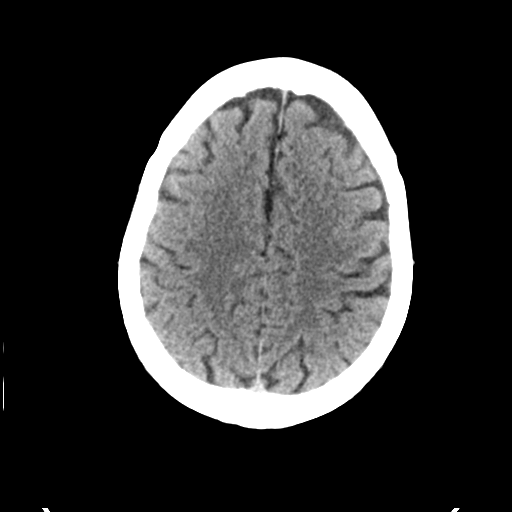
[im 21/33  bone]
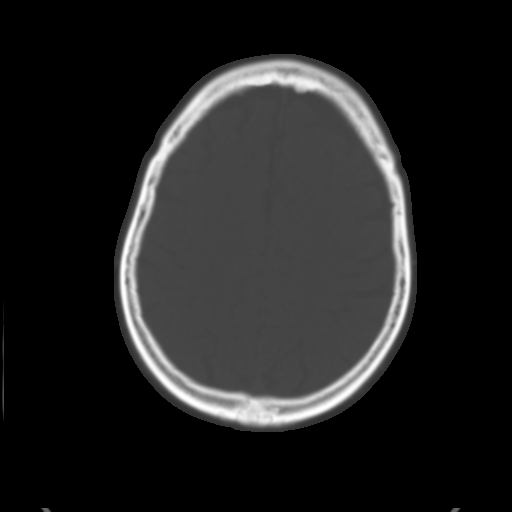
[im 25/33  brain]
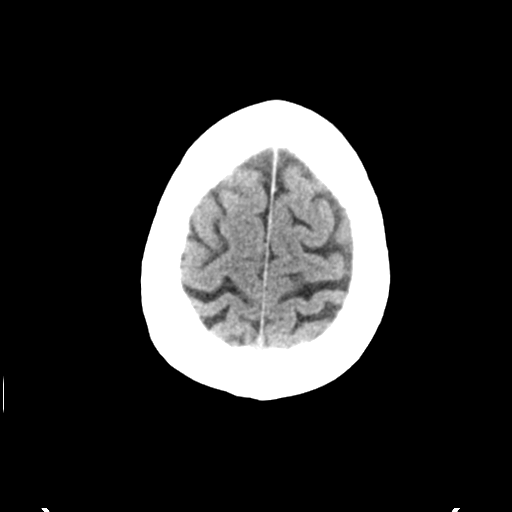
[im 29/33  brain]
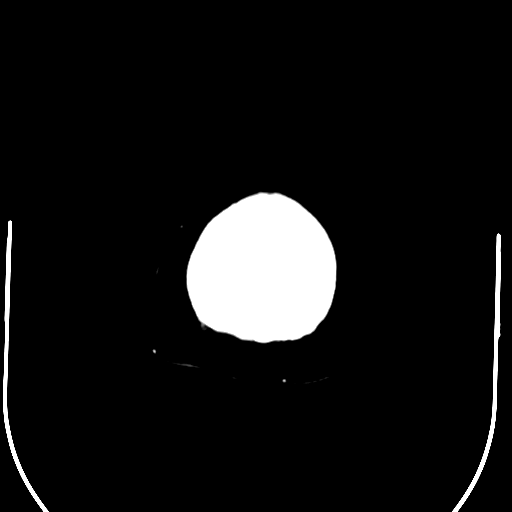

[Series 4: head bone · axial · 0.46mm/px · z∈[-122,-106]mm · 2 of 81 slices shown]
[im 9/81  bone]
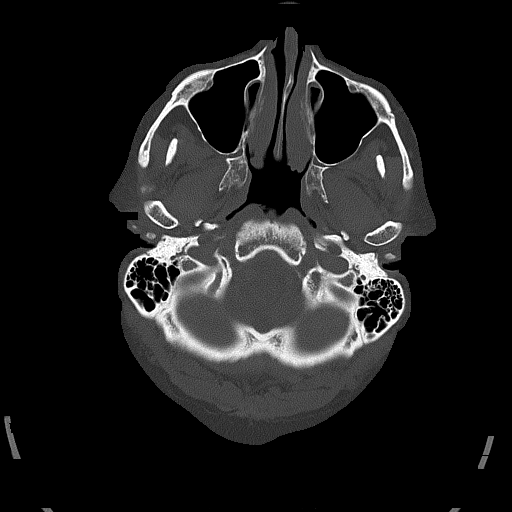
[im 17/81  bone]
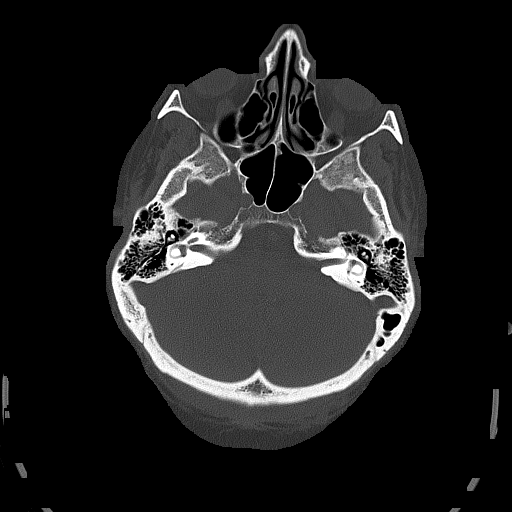

[Series 5: head without cor · coronal · non-contrast · 0.27mm/px · 3 of 71 slices shown]
[im 24/71  brain]
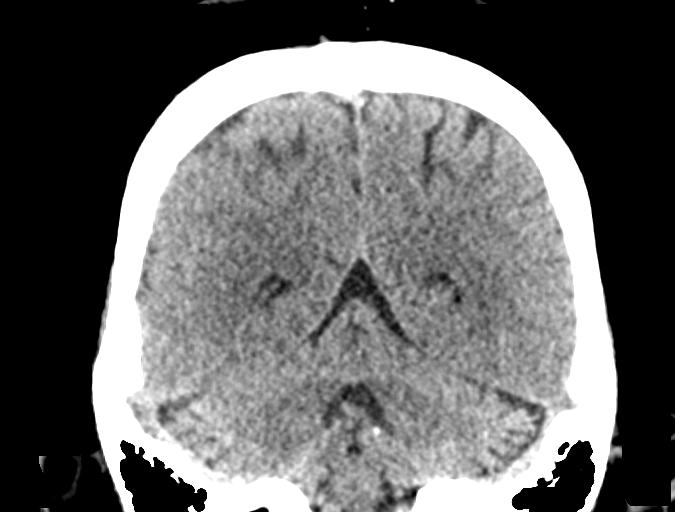
[im 32/71  brain]
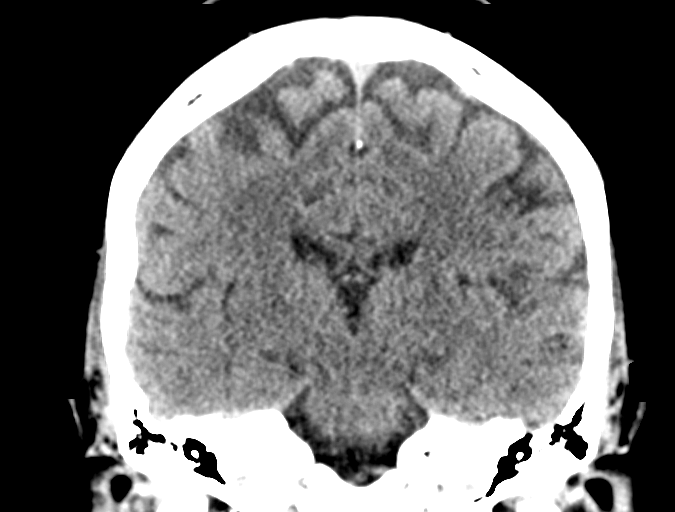
[im 39/71  brain]
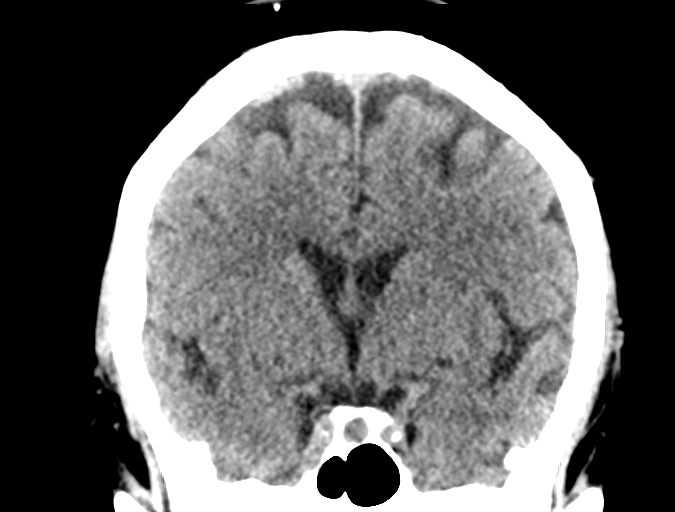

[Series 6: head without sag · sagittal · non-contrast · 0.26mm/px · 3 of 59 slices shown]
[im 20/59  brain]
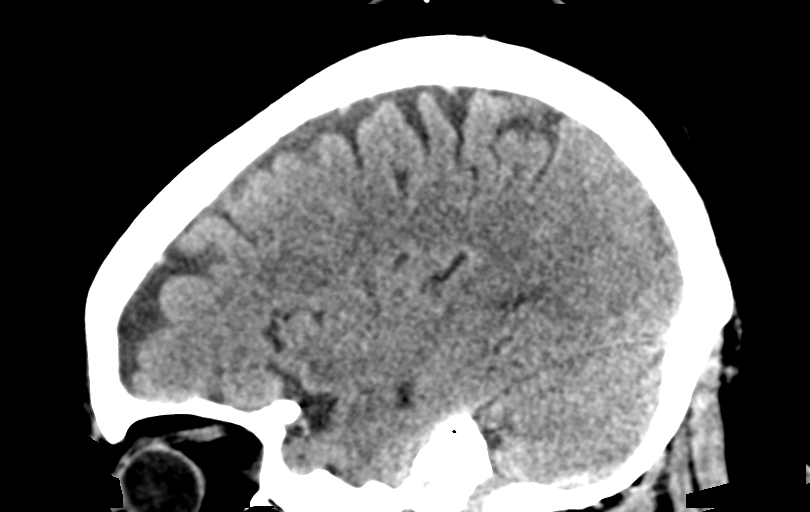
[im 30/59  brain]
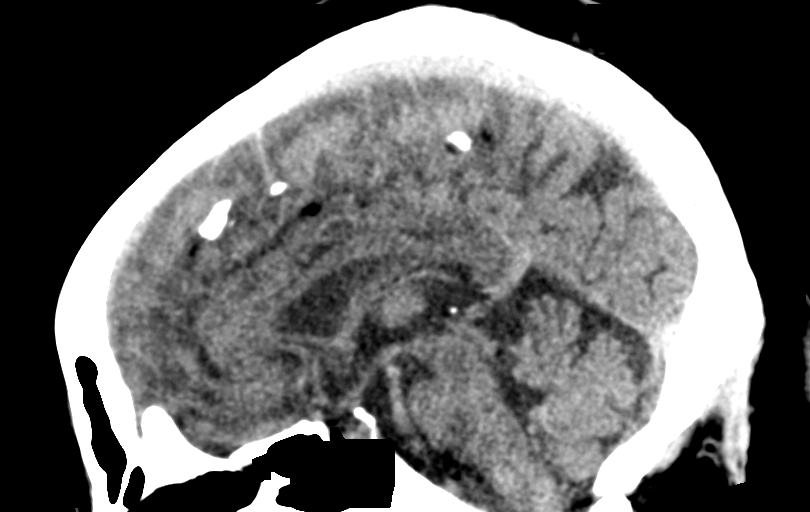
[im 39/59  brain]
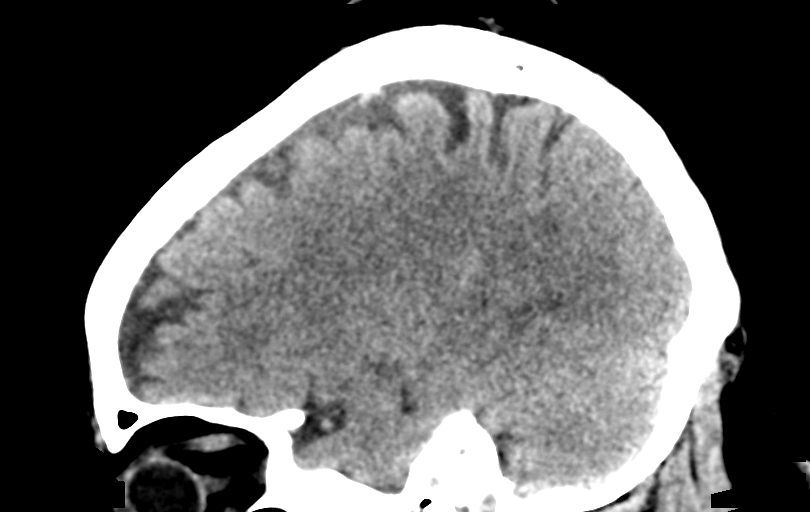

[15 of 47 positions shown; findings below may reference images not displayed]

FINDINGS: Brain: No acute territorial infarction, hemorrhage or intracranial
mass. Minimal white matter hypodensity consistent with chronic small
vessel ischemic change. Normal ventricle size.

Vascular: No hyperdense vessels.  Carotid vascular calcification

Skull: Normal. Negative for fracture or focal lesion.

Sinuses/Orbits: No acute finding.

Other: None
IMPRESSION: 1. No CT evidence for acute intracranial abnormality.
2. Minimal chronic small vessel ischemic changes of the white matter

## 2023-08-15 DIAGNOSIS — I5032 Chronic diastolic (congestive) heart failure: Secondary | ICD-10-CM | POA: Diagnosis not present

## 2023-08-15 DIAGNOSIS — D131 Benign neoplasm of stomach: Secondary | ICD-10-CM | POA: Diagnosis not present

## 2023-08-15 DIAGNOSIS — I110 Hypertensive heart disease with heart failure: Secondary | ICD-10-CM | POA: Diagnosis not present

## 2023-08-15 DIAGNOSIS — J449 Chronic obstructive pulmonary disease, unspecified: Secondary | ICD-10-CM | POA: Diagnosis not present

## 2023-08-15 DIAGNOSIS — E119 Type 2 diabetes mellitus without complications: Secondary | ICD-10-CM | POA: Diagnosis not present

## 2023-08-15 DIAGNOSIS — D62 Acute posthemorrhagic anemia: Secondary | ICD-10-CM | POA: Diagnosis not present

## 2023-08-16 DIAGNOSIS — I5032 Chronic diastolic (congestive) heart failure: Secondary | ICD-10-CM | POA: Diagnosis not present

## 2023-08-16 DIAGNOSIS — I110 Hypertensive heart disease with heart failure: Secondary | ICD-10-CM | POA: Diagnosis not present

## 2023-08-16 DIAGNOSIS — D131 Benign neoplasm of stomach: Secondary | ICD-10-CM | POA: Diagnosis not present

## 2023-08-16 DIAGNOSIS — J449 Chronic obstructive pulmonary disease, unspecified: Secondary | ICD-10-CM | POA: Diagnosis not present

## 2023-08-16 DIAGNOSIS — D62 Acute posthemorrhagic anemia: Secondary | ICD-10-CM | POA: Diagnosis not present

## 2023-08-16 DIAGNOSIS — E119 Type 2 diabetes mellitus without complications: Secondary | ICD-10-CM | POA: Diagnosis not present

## 2023-08-17 DIAGNOSIS — D131 Benign neoplasm of stomach: Secondary | ICD-10-CM | POA: Diagnosis not present

## 2023-08-17 DIAGNOSIS — E119 Type 2 diabetes mellitus without complications: Secondary | ICD-10-CM | POA: Diagnosis not present

## 2023-08-17 DIAGNOSIS — D62 Acute posthemorrhagic anemia: Secondary | ICD-10-CM | POA: Diagnosis not present

## 2023-08-17 DIAGNOSIS — I5032 Chronic diastolic (congestive) heart failure: Secondary | ICD-10-CM | POA: Diagnosis not present

## 2023-08-17 DIAGNOSIS — I110 Hypertensive heart disease with heart failure: Secondary | ICD-10-CM | POA: Diagnosis not present

## 2023-08-17 DIAGNOSIS — J449 Chronic obstructive pulmonary disease, unspecified: Secondary | ICD-10-CM | POA: Diagnosis not present

## 2023-08-20 ENCOUNTER — Other Ambulatory Visit: Payer: 59

## 2023-08-20 ENCOUNTER — Ambulatory Visit: Payer: 59 | Admitting: Oncology

## 2023-08-20 DIAGNOSIS — D62 Acute posthemorrhagic anemia: Secondary | ICD-10-CM | POA: Diagnosis not present

## 2023-08-20 DIAGNOSIS — D131 Benign neoplasm of stomach: Secondary | ICD-10-CM | POA: Diagnosis not present

## 2023-08-20 DIAGNOSIS — I110 Hypertensive heart disease with heart failure: Secondary | ICD-10-CM | POA: Diagnosis not present

## 2023-08-20 DIAGNOSIS — E119 Type 2 diabetes mellitus without complications: Secondary | ICD-10-CM | POA: Diagnosis not present

## 2023-08-20 DIAGNOSIS — J449 Chronic obstructive pulmonary disease, unspecified: Secondary | ICD-10-CM | POA: Diagnosis not present

## 2023-08-20 DIAGNOSIS — I5032 Chronic diastolic (congestive) heart failure: Secondary | ICD-10-CM | POA: Diagnosis not present

## 2023-08-22 ENCOUNTER — Encounter: Payer: Self-pay | Admitting: Family Medicine

## 2023-08-22 ENCOUNTER — Ambulatory Visit (INDEPENDENT_AMBULATORY_CARE_PROVIDER_SITE_OTHER): Admitting: Family Medicine

## 2023-08-22 VITALS — BP 110/72 | HR 84 | Temp 98.3°F | Ht 64.0 in | Wt 285.2 lb

## 2023-08-22 DIAGNOSIS — R0602 Shortness of breath: Secondary | ICD-10-CM | POA: Diagnosis not present

## 2023-08-22 DIAGNOSIS — M797 Fibromyalgia: Secondary | ICD-10-CM | POA: Diagnosis not present

## 2023-08-22 DIAGNOSIS — R42 Dizziness and giddiness: Secondary | ICD-10-CM | POA: Diagnosis not present

## 2023-08-22 DIAGNOSIS — E876 Hypokalemia: Secondary | ICD-10-CM | POA: Diagnosis not present

## 2023-08-22 DIAGNOSIS — N39498 Other specified urinary incontinence: Secondary | ICD-10-CM

## 2023-08-22 DIAGNOSIS — E1140 Type 2 diabetes mellitus with diabetic neuropathy, unspecified: Secondary | ICD-10-CM

## 2023-08-22 DIAGNOSIS — Z7901 Long term (current) use of anticoagulants: Secondary | ICD-10-CM | POA: Diagnosis not present

## 2023-08-22 DIAGNOSIS — D500 Iron deficiency anemia secondary to blood loss (chronic): Secondary | ICD-10-CM | POA: Diagnosis not present

## 2023-08-22 DIAGNOSIS — I5032 Chronic diastolic (congestive) heart failure: Secondary | ICD-10-CM

## 2023-08-22 DIAGNOSIS — E119 Type 2 diabetes mellitus without complications: Secondary | ICD-10-CM | POA: Diagnosis not present

## 2023-08-22 DIAGNOSIS — E559 Vitamin D deficiency, unspecified: Secondary | ICD-10-CM

## 2023-08-22 DIAGNOSIS — N179 Acute kidney failure, unspecified: Secondary | ICD-10-CM

## 2023-08-22 DIAGNOSIS — F411 Generalized anxiety disorder: Secondary | ICD-10-CM

## 2023-08-22 DIAGNOSIS — I10 Essential (primary) hypertension: Secondary | ICD-10-CM | POA: Diagnosis not present

## 2023-08-22 DIAGNOSIS — Z7985 Long-term (current) use of injectable non-insulin antidiabetic drugs: Secondary | ICD-10-CM | POA: Diagnosis not present

## 2023-08-22 DIAGNOSIS — Z7984 Long term (current) use of oral hypoglycemic drugs: Secondary | ICD-10-CM

## 2023-08-22 DIAGNOSIS — D131 Benign neoplasm of stomach: Secondary | ICD-10-CM | POA: Diagnosis not present

## 2023-08-22 DIAGNOSIS — D62 Acute posthemorrhagic anemia: Secondary | ICD-10-CM | POA: Diagnosis not present

## 2023-08-22 DIAGNOSIS — J449 Chronic obstructive pulmonary disease, unspecified: Secondary | ICD-10-CM | POA: Diagnosis not present

## 2023-08-22 DIAGNOSIS — I110 Hypertensive heart disease with heart failure: Secondary | ICD-10-CM | POA: Diagnosis not present

## 2023-08-22 LAB — BASIC METABOLIC PANEL WITH GFR
BUN: 21 mg/dL (ref 6–23)
CO2: 26 meq/L (ref 19–32)
Calcium: 9.5 mg/dL (ref 8.4–10.5)
Chloride: 99 meq/L (ref 96–112)
Creatinine, Ser: 1.14 mg/dL (ref 0.40–1.20)
GFR: 49.57 mL/min — ABNORMAL LOW (ref >60.00)
Glucose, Bld: 105 mg/dL — ABNORMAL HIGH (ref 70–99)
Potassium: 3.8 meq/L (ref 3.5–5.1)
Sodium: 136 meq/L (ref 135–145)

## 2023-08-22 LAB — CBC WITH DIFFERENTIAL/PLATELET
Basophils Absolute: 0 10*3/uL (ref 0.0–0.1)
Basophils Relative: 0.4 % (ref 0.0–3.0)
Eosinophils Absolute: 0.1 10*3/uL (ref 0.0–0.7)
Eosinophils Relative: 2.2 % (ref 0.0–5.0)
HCT: 35.6 % — ABNORMAL LOW (ref 36.0–46.0)
Hemoglobin: 11.7 g/dL — ABNORMAL LOW (ref 12.0–15.0)
Lymphocytes Relative: 32.9 % (ref 12.0–46.0)
Lymphs Abs: 1.8 10*3/uL (ref 0.7–4.0)
MCHC: 32.7 g/dL (ref 30.0–36.0)
MCV: 82.4 fl (ref 78.0–100.0)
Monocytes Absolute: 0.5 10*3/uL (ref 0.1–1.0)
Monocytes Relative: 9.4 % (ref 3.0–12.0)
Neutro Abs: 3 10*3/uL (ref 1.4–7.7)
Neutrophils Relative %: 55.1 % (ref 43.0–77.0)
Platelets: 362 10*3/uL (ref 150.0–400.0)
RBC: 4.33 Mil/uL (ref 3.87–5.11)
RDW: 20.1 % — ABNORMAL HIGH (ref 11.5–15.5)
WBC: 5.4 10*3/uL (ref 4.0–10.5)

## 2023-08-22 LAB — COMPREHENSIVE METABOLIC PANEL WITH GFR
ALT: 15 U/L (ref 0–35)
AST: 14 U/L (ref 0–37)
Albumin: 4.5 g/dL (ref 3.5–5.2)
Alkaline Phosphatase: 80 U/L (ref 39–117)
BUN: 21 mg/dL (ref 6–23)
CO2: 26 meq/L (ref 19–32)
Calcium: 9.5 mg/dL (ref 8.4–10.5)
Chloride: 99 meq/L (ref 96–112)
Creatinine, Ser: 1.14 mg/dL (ref 0.40–1.20)
GFR: 49.57 mL/min — ABNORMAL LOW (ref >60.00)
Glucose, Bld: 105 mg/dL — ABNORMAL HIGH (ref 70–99)
Potassium: 3.8 meq/L (ref 3.5–5.1)
Sodium: 136 meq/L (ref 135–145)
Total Bilirubin: 0.3 mg/dL (ref 0.2–1.2)
Total Protein: 8.7 g/dL — ABNORMAL HIGH (ref 6.0–8.3)

## 2023-08-22 LAB — PROTIME-INR
INR: 3.6 ratio — ABNORMAL HIGH (ref 0.8–1.0)
Prothrombin Time: 36 s — ABNORMAL HIGH (ref 9.6–13.1)

## 2023-08-22 NOTE — Patient Instructions (Signed)
 Clonidine  was stopped at your last hospital visit.

## 2023-08-22 NOTE — Progress Notes (Unsigned)
 Established Patient Office Visit   Subjective  Patient ID: [name], female    DOB: Nov 14, 1955  Age: 68 y.o. MRN: [id number]  Chief Complaint  Patient presents with  . Medical Management of Chronic Issues    6 week follow-up incontinent supplies order form,  hospital follow-up- low BP, drops when standing up, SOB, dizziness   Patient accompanied by her daughter.  Patient is a 68 year old female seen for follow-up.  Patient hospitalized on 5/4-5/8 for supratherapeutic INR greater than 5 causing bruising on extremities.  Ultrasound negative for DVT.  Patient readmitted 5/9-5/02/2024 for syncope.  Orthostatics were negative.  Improper medication administration thought to be contributing to symptoms.  Patient mentions she is taking everything   Patient Active Problem List   Diagnosis Date Noted  . Syncope 07/27/2023  . Acute blood loss anemia 07/23/2023  . History of COPD 07/23/2023  . DM2 (diabetes mellitus, type 2) (HCC) 03/05/2023  . 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  . Chronic idiopathic constipation 03/05/2023  . Gastro-esophageal reflux disease without esophagitis 03/05/2023  . Iron  deficiency anemia 03/05/2023  . Screening for malignant neoplasm of colon 03/05/2023  . Generalized anxiety disorder 10/19/2022  . Major depressive disorder, recurrent, moderate (HCC) 10/19/2022  . Anemia of chronic disease 08/23/2022  . Left knee pain 08/23/2022  . HLD (hyperlipidemia) 10/07/2021  . Paroxysmal SVT (supraventricular tachycardia) (HCC) 10/07/2021  . Allergic rhinitis 11/29/2020  . Allergic rhinitis due to pollen 11/29/2020  . Moderate persistent asthma without complication 11/29/2020  . Osteoarthritis of spine with radiculopathy, lumbar region 08/24/2020  . Chronic back pain 08/18/2020  . Obesity, diabetes, and hypertension syndrome (HCC) 02/27/2020  . 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 Pseudophakia of both eyes 02/09/2020  . Chronic diastolic CHF  (congestive heart failure) (HCC) 03/12/2019  . Hypernatremia 03/09/2019  . Suspected COVID-19 virus infection 03/09/2019  . Thyroid  mass 02/27/2017  . Acute pain of right lower extremity 05/24/2016  . Effusion, right knee 04/28/2016  . Presence of right artificial knee joint 04/28/2016  . Abnormal LFTs   . Decreased sensation   . Paresthesia 06/12/2014  . Obstruction of kidney 04/24/2014  . Chronic anticoagulation 04/24/2014  . Crack cocaine use 04/24/2014  . Depression 04/24/2014  . Fibromyalgia 04/24/2014  . Obstructive sleep apnea on CPAP 04/24/2014  . Lupus anticoagulant positive 04/24/2014  . Adrenal mass, right (HCC) 04/24/2014  . Right kidney mass 04/24/2014  . Hydronephrosis of right kidney 04/24/2014  . Supratherapeutic INR 04/24/2014  . Renal mass 04/24/2014  . Chronic obstructive pulmonary disease (HCC) 04/24/2014  . Right lower quadrant abdominal pain   . Morbid obesity (HCC) 08/21/2013  . Constipation 08/19/2013  . Lumbosacral spondylosis without myelopathy 11/19/2012  . Morbid obesity with BMI of 45.0-49.9, adult (HCC) 01/05/2012  . Bilateral deep vein thromboses (HCC) 12/13/2011  . Hypokalemia 11/05/2011  . Sciatica 11/05/2011  . Essential hypertension 11/05/2011  . Diabetes type 2, controlled (HCC) 11/05/2011   Past Medical History:  Diagnosis Date  . Adrenal mass (HCC) 03/226   Benign  . Arthritis    knees/multiple orthopedic conditons; lower back  . Asthma    per pt  . 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 Depression   . Diabetes mellitus    120s usually fasting -  dx more than 10 yrs ago  . Dizziness, nonspecific   . DVT (deep venous  thrombosis) (HCC)    Recurrent  . Fibromyalgia   . GERD (gastroesophageal reflux disease)   . Heart murmur   . History of blood transfusion   . History of cocaine abuse (HCC)    Remote history    . Hyperlipidemia   . Hypertension    takes meds daily  . Hypothyroidism   . Lupus Anticoagulant Positive   . On home oxygen  therapy    at night  . Shortness of breath    exertion or lying flat  . Sleep apnea    2l of oxygen  at night (as of 12/6, she used to)   Past Surgical History:  Procedure Laterality Date  . ABDOMINAL HYSTERECTOMY  1989   Fibroids  . ANTERIOR LUMBAR FUSION  01/03/2012   Procedure: ANTERIOR LUMBAR FUSION 1 LEVEL;  Surgeon: Pasty Bongo, MD;  Location: [facility];  Service: Neurosurgery;  Laterality: N/A;  Lumbar Four-Five Anterior Lumbar Interbody Fusion with Instrumentation  . BACK SURGERY     cervical spine---disk disease  . CARPAL TUNNEL RELEASE     Bilateral  . CESAREAN SECTION    . CHOLECYSTECTOMY    . CYSTOSCOPY/RETROGRADE/URETEROSCOPY/STONE EXTRACTION WITH BASKET Right 04/26/2014   Procedure: CYSTOSCOPY/ RIGHT RETROGRADE/ RIGHT URETEROSCOPY/URETERAL AND RENAL PELVIS BIOPSY;  Surgeon: Osborn Blaze, MD;  Location: [facility];  Service: Urology;  Laterality: Right;  . ESOPHAGOGASTRODUODENOSCOPY (EGD) WITH PROPOFOL  N/A 08/25/2022   Procedure: ESOPHAGOGASTRODUODENOSCOPY (EGD) WITH PROPOFOL ;  Surgeon: Alvis Jourdain, MD;  Location: [facility];  Service: Gastroenterology;  Laterality: N/A;  . gallstones removed    . HEMOSTASIS CLIP PLACEMENT  08/25/2022   Procedure: HEMOSTASIS CLIP PLACEMENT;  Surgeon: Alvis Jourdain, MD;  Location: [facility];  Service: Gastroenterology;;  . HOT HEMOSTASIS N/A 08/25/2022   Procedure: HOT HEMOSTASIS (ARGON PLASMA COAGULATION/BICAP);  Surgeon: Alvis Jourdain, MD;  Location: [facility];  Service: Gastroenterology;  Laterality: N/A;  . POLYPECTOMY  08/25/2022   Procedure: POLYPECTOMY;  Surgeon: Alvis Jourdain, MD;  Location: [facility];  Service: Gastroenterology;;  . 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   bilateral  . right elbow surgery    . RIGHT HEART CATH N/A 02/28/2021   Procedure: RIGHT HEART CATH;  Surgeon: Odie Benne, MD;  Location: [facility];  Service: Cardiovascular;  Laterality: N/A;  . THYROID  LOBECTOMY Right 02/27/2017  . THYROID  LOBECTOMY Right 02/27/2017   Procedure: RIGHT THYROID  LOBECTOMY;  Surgeon: Sim Dryer, MD;  Location: [facility];  Service: General;  Laterality: Right;  . TUBAL LIGATION     Social History   Tobacco Use  . Smoking status: Never  . Smokeless tobacco: Never  Vaping Use  . Vaping status: Never Used  Substance Use Topics  . Alcohol use: No    Alcohol/week: 0.0 standard drinks of alcohol    Comment: beer in past   . Drug use: Yes    Types: Cocaine    Comment: quit 2003-rehab progam in 2005   Family History  Problem Relation Age of Onset  . Hypertension Mother   . Diabetes Mother   . Cancer Mother        Pancreatic  . Hypertension Father   . Heart failure Father   . Hyperlipidemia Other   . COPD Other    Allergies  Allergen Reactions  . Lisinopril  Anaphylaxis, Swelling and Other (See Comments)    THROAT SWELLING, Pt states her throat started to "close up" after being on it for "so long."   . Prednisone  Anaphylaxis, Swelling and Other (  See Comments)    Throat swelling and tongue irritation  . Spironolactone  Other (See Comments)    Caused stroke-like symptoms with the mouth  . Corticosteroids Rash    States she developed a rash on her tongue after a steroid injection in her back    ROS Negative unless stated above    Objective:      BP 110/72 (BP Location: Left Arm, Patient Position: Sitting, Cuff Size: Normal)   Pulse 84   Temp 98.3 F (36.8 C) (Oral)   Ht 5\' 4"  (1.626 m)   Wt 285 lb 3.2 oz (129.4 kg)   SpO2 94%   BMI 48.95 kg/m  BP Readings from Last 3 Encounters:  08/22/23 110/72  07/30/23 121/68  07/26/23 (!) 147/97   Wt Readings from Last 3 Encounters:  08/22/23 285 lb 3.2 oz (129.4 kg)  07/27/23 295 lb 6.7 oz (134 kg)  07/25/23 295 lb 6.7 oz (134 kg)      Physical Exam     07/11/2023   10:33 AM  06/11/2023    2:25 PM 03/07/2023    6:12 PM  Depression screen PHQ 2/9  Decreased Interest 1 1 0  Down, Depressed, Hopeless 1 1 0  PHQ - 2 Score 2 2 0  Altered sleeping 1 1   Tired, decreased energy 3 1   Change in appetite 1 1   Feeling bad or failure about yourself  1 1   Trouble concentrating 1 1   Moving slowly or fidgety/restless 1 0   Suicidal thoughts 0 0   PHQ-9 Score 10 7   Difficult doing work/chores Somewhat difficult Somewhat difficult       07/11/2023   10:33 AM 06/11/2023    2:25 PM 12/22/2022    8:40 AM 09/06/2022   11:14 AM  GAD 7 : Generalized Anxiety Score  Nervous, Anxious, on Edge 1 1 1 1   Control/stop worrying 1 1 1 1   Worry too much - different things 1 1 1 1   Trouble relaxing 1 1 0 1  Restless 0 1 1 0  Easily annoyed or irritable 1 1 0 0  Afraid - awful might happen 0 0 1 1  Total GAD 7 Score 5 6 5 5   Anxiety Difficulty Somewhat difficult  Somewhat difficult Not difficult at all     No results found for any visits on 08/22/23.    Assessment & Plan:   Type 2 diabetes mellitus with diabetic neuropathy, without long-term current use of insulin  (HCC)  Essential hypertension  AKI (acute kidney injury) (HCC)  Vitamin D  deficiency  Iron  deficiency anemia due to chronic blood loss  Hypokalemia  Generalized anxiety disorder  Other urinary incontinence    No follow-ups on file.   Viola Greulich, MD

## 2023-08-23 DIAGNOSIS — H35061 Retinal vasculitis, right eye: Secondary | ICD-10-CM | POA: Diagnosis not present

## 2023-08-23 DIAGNOSIS — H43823 Vitreomacular adhesion, bilateral: Secondary | ICD-10-CM | POA: Diagnosis not present

## 2023-08-23 DIAGNOSIS — E119 Type 2 diabetes mellitus without complications: Secondary | ICD-10-CM | POA: Diagnosis not present

## 2023-08-23 DIAGNOSIS — R32 Unspecified urinary incontinence: Secondary | ICD-10-CM | POA: Insufficient documentation

## 2023-08-23 LAB — IRON,TIBC AND FERRITIN PANEL
%SAT: 19 % (ref 16–45)
Ferritin: 25 ng/mL (ref 16–288)
Iron: 65 ug/dL (ref 45–160)
TIBC: 338 ug/dL (ref 250–450)

## 2023-08-23 LAB — HM DIABETES EYE EXAM

## 2023-08-24 DIAGNOSIS — D62 Acute posthemorrhagic anemia: Secondary | ICD-10-CM | POA: Diagnosis not present

## 2023-08-24 DIAGNOSIS — D131 Benign neoplasm of stomach: Secondary | ICD-10-CM | POA: Diagnosis not present

## 2023-08-24 DIAGNOSIS — I110 Hypertensive heart disease with heart failure: Secondary | ICD-10-CM | POA: Diagnosis not present

## 2023-08-24 DIAGNOSIS — J449 Chronic obstructive pulmonary disease, unspecified: Secondary | ICD-10-CM | POA: Diagnosis not present

## 2023-08-24 DIAGNOSIS — I5032 Chronic diastolic (congestive) heart failure: Secondary | ICD-10-CM | POA: Diagnosis not present

## 2023-08-24 DIAGNOSIS — E119 Type 2 diabetes mellitus without complications: Secondary | ICD-10-CM | POA: Diagnosis not present

## 2023-08-27 ENCOUNTER — Inpatient Hospital Stay (HOSPITAL_BASED_OUTPATIENT_CLINIC_OR_DEPARTMENT_OTHER): Payer: 59 | Admitting: Oncology

## 2023-08-27 ENCOUNTER — Ambulatory Visit: Payer: Self-pay | Admitting: Family Medicine

## 2023-08-27 ENCOUNTER — Inpatient Hospital Stay: Payer: 59 | Attending: Oncology

## 2023-08-27 VITALS — BP 108/64 | HR 81 | Temp 98.2°F | Resp 18 | Ht 64.0 in | Wt 284.0 lb

## 2023-08-27 DIAGNOSIS — D6862 Lupus anticoagulant syndrome: Secondary | ICD-10-CM | POA: Insufficient documentation

## 2023-08-27 DIAGNOSIS — Z86718 Personal history of other venous thrombosis and embolism: Secondary | ICD-10-CM | POA: Diagnosis not present

## 2023-08-27 DIAGNOSIS — Z7901 Long term (current) use of anticoagulants: Secondary | ICD-10-CM | POA: Diagnosis not present

## 2023-08-27 DIAGNOSIS — D649 Anemia, unspecified: Secondary | ICD-10-CM | POA: Diagnosis not present

## 2023-08-27 LAB — CBC WITH DIFFERENTIAL (CANCER CENTER ONLY)
Abs Immature Granulocytes: 0.02 10*3/uL (ref 0.00–0.07)
Basophils Absolute: 0 10*3/uL (ref 0.0–0.1)
Basophils Relative: 0 %
Eosinophils Absolute: 0.2 10*3/uL (ref 0.0–0.5)
Eosinophils Relative: 3 %
HCT: 34.7 % — ABNORMAL LOW (ref 36.0–46.0)
Hemoglobin: 11.1 g/dL — ABNORMAL LOW (ref 12.0–15.0)
Immature Granulocytes: 0 %
Lymphocytes Relative: 32 %
Lymphs Abs: 2.5 10*3/uL (ref 0.7–4.0)
MCH: 27.1 pg (ref 26.0–34.0)
MCHC: 32 g/dL (ref 30.0–36.0)
MCV: 84.6 fL (ref 80.0–100.0)
Monocytes Absolute: 0.5 10*3/uL (ref 0.1–1.0)
Monocytes Relative: 7 %
Neutro Abs: 4.5 10*3/uL (ref 1.7–7.7)
Neutrophils Relative %: 58 %
Platelet Count: 345 10*3/uL (ref 150–400)
RBC: 4.1 MIL/uL (ref 3.87–5.11)
RDW: 19.2 % — ABNORMAL HIGH (ref 11.5–15.5)
WBC Count: 7.9 10*3/uL (ref 4.0–10.5)
nRBC: 0 % (ref 0.0–0.2)

## 2023-08-27 LAB — PROTIME-INR
INR: 2.5 — ABNORMAL HIGH (ref 0.8–1.2)
Prothrombin Time: 27.3 s — ABNORMAL HIGH (ref 11.4–15.2)

## 2023-08-27 NOTE — Progress Notes (Signed)
 [facility] OFFICE PROGRESS NOTE   Diagnosis: Chronic anticoagulation  INTERVAL HISTORY:   [name] returns as scheduled.  She was admitted twice last month.  She was admitted with a supratherapeutic PT/INR and anemia.  She was subsequent mated with syncope.  Leg Dopplers were negative for DVT and a CT chest 07/27/2023 was negative for pulmonary embolism.  She continues to feel "lightheaded ".  Her blood pressure has been running low.  No bleeding.  The lesion at the left abdominal wall has "healed ".  Objective:  Vital signs in last 24 hours:  Blood pressure 108/64, pulse 81, temperature 98.2 F (36.8 C), temperature source Temporal, resp. rate 18, height 5\' 4"  (1.626 m), weight 284 lb (128.8 kg), SpO2 98%.    Resp: Lungs clear bilaterally Cardio: Regular rate and rhythm GI: No hepatosplenomegaly, nontender Vascular: The left lower leg is larger than the right side, tenderness at the lower leg bilaterally, no erythema  Skin: Hyperpigmented thickening/plaque at the left lower abdomen without skin breakdown   Lab Results:  Lab Results  Component Value Date   WBC 7.9 08/27/2023   HGB 11.1 (L) 08/27/2023   HCT 34.7 (L) 08/27/2023   MCV 84.6 08/27/2023   PLT 345 08/27/2023   NEUTROABS 4.5 08/27/2023    CMP  Lab Results  Component Value Date   NA 136 08/22/2023   NA 136 08/22/2023   K 3.8 08/22/2023   K 3.8 08/22/2023   CL 99 08/22/2023   CL 99 08/22/2023   CO2 26 08/22/2023   CO2 26 08/22/2023   GLUCOSE 105 (H) 08/22/2023   GLUCOSE 105 (H) 08/22/2023   BUN 21 08/22/2023   BUN 21 08/22/2023   CREATININE 1.14 08/22/2023   CREATININE 1.14 08/22/2023   CALCIUM  9.5 08/22/2023   CALCIUM  9.5 08/22/2023   PROT 8.7 (H) 08/22/2023   ALBUMIN  4.5 08/22/2023   AST 14 08/22/2023   ALT 15 08/22/2023   ALKPHOS 80 08/22/2023   BILITOT 0.3 08/22/2023   GFRNONAA >60 07/30/2023   GFRAA 94 03/05/2020    No results found for: "CEA1", "CEA", "ZHY865",  "CA125"  Lab Results  Component Value Date   INR 2.5 (H) 08/27/2023   LABPROT 27.3 (H) 08/27/2023    Medications: I have reviewed the patient's current medications.   Assessment/Plan:  History of bilateral lower extremity Franklin vein thrombosis. She continues Coumadin  anticoagulation. History of a positive lupus anticoagulant. History of a positive beta-2 -glycoprotein IgA antibody. Chronic bilateral leg pain - negative bilateral Doppler January 2010. History of hypertension. COPD Status post hysterectomy. Remote history of cocaine abuse. History of an adrenal lesion on a CT scan in 2008 - status post a repeat CT on June 16, 2009, showing slow enlargement of the right adrenal nodule since 2006, most consistent with an adenoma. Stable on a CT 04/24/2014 History of rectal and vaginal bleeding in May 2009.   Status post carpal tunnel surgery. 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 Right knee replacement September 2011. Multiple orthopedic conditions. Placement of IVC filter preoperatively before knee replacement. The IVC filter was retrieved 03/01/2011. Anemia. Ferritin in normal range at 82 on 08/18/2013; 61 on 07/10/2016 Status post lumbar fusion surgery October 2013 Right hydronephrosis-evaluated by Dr. Secundino Dach Left ankle swelling/pain 07/24/2016 Mild anemia noted on hospital admission 2018 Colonoscopy 04/19/2017-entire examined colon normal.  Repeat colonoscopy in 10 years for surveillance. Admission with COVID-19 infection 03/09/2019 Admission 08/23/2022 with severe anemia and supratherapeutic PT/INR Upper endoscopy 08/25/2022: Bleeding angioectasia in the  duodenum, gastric polyps with bleeding were removed-fundic gland polyp and hyperplastic polyps 23.  Admission 07/22/2023 with a supratherapeutic PT/INR and anemia    Disposition: [name] is unchanged.  The PT/INR is therapeutic today.  She was recently admitted with a supratherapeutic PT/INR and anemia.  The hemoglobin is higher  today.  She likely has anemia from "chronic disease "and subclinical chronic GI blood loss.  She will continue Coumadin  at current dose.  She will return for a lab visit in 1 month.  She will be scheduled for an office visit in 6 months.  We will adjust the Coumadin  dose as indicated based on the monthly PT/INR.  She will follow-up with Dr. Arliss Lam for management of hypertension.  Colleen Deep, MD  08/27/2023  12:39 PM

## 2023-08-28 ENCOUNTER — Ambulatory Visit: Payer: Self-pay

## 2023-08-28 ENCOUNTER — Telehealth: Payer: Self-pay

## 2023-08-28 DIAGNOSIS — E119 Type 2 diabetes mellitus without complications: Secondary | ICD-10-CM | POA: Diagnosis not present

## 2023-08-28 DIAGNOSIS — I110 Hypertensive heart disease with heart failure: Secondary | ICD-10-CM | POA: Diagnosis not present

## 2023-08-28 DIAGNOSIS — J449 Chronic obstructive pulmonary disease, unspecified: Secondary | ICD-10-CM | POA: Diagnosis not present

## 2023-08-28 DIAGNOSIS — I5032 Chronic diastolic (congestive) heart failure: Secondary | ICD-10-CM | POA: Diagnosis not present

## 2023-08-28 DIAGNOSIS — D62 Acute posthemorrhagic anemia: Secondary | ICD-10-CM | POA: Diagnosis not present

## 2023-08-28 DIAGNOSIS — D131 Benign neoplasm of stomach: Secondary | ICD-10-CM | POA: Diagnosis not present

## 2023-08-28 NOTE — Telephone Encounter (Signed)
 Forms for continence care have been faxed

## 2023-08-28 NOTE — Telephone Encounter (Signed)
Appt 6/11

## 2023-08-28 NOTE — Telephone Encounter (Signed)
 FYI

## 2023-08-28 NOTE — Telephone Encounter (Signed)
  FYI Only or Action Required?: FYI only for provider  Patient was last seen in primary care on 08/22/2023 by Viola Greulich, MD. Called Nurse Triage reporting Leg Pain. Symptoms began yesterday. Interventions attempted: Nothing. Symptoms are: right hip/leg pain gradually worsening. Advised patient to take Tylenol , use heat and call back if pain worsens or does not improve after that.  Triage Disposition: See PCP When Office is Open (Within 3 Days)  Patient/caregiver understands and will follow disposition?: Yes                            Copied from CRM [id number]. Topic: Clinical - Home Health Verbal Orders >> Aug 28, 2023 10:41 AM Artemio Larry wrote: Caller/Agency: Jasmine from [facility]  Callback Number: [phone] Service Requested: Occupational Therapy Frequency: N/A Any new concerns about the patient? Yes, patient having pain in right lower extremity - rates pain 10/10 Reason for Disposition  [1] MODERATE pain (e.g., interferes with normal activities, limping) AND [2] present > 3 days  Answer Assessment - Initial Assessment Questions 1. ONSET: "When did the pain start?"      Yesterday, worsened today.  2. LOCATION: "Where is the pain located?"      Right leg- hip down the leg.  3. PAIN: "How bad is the pain?"    (Scale 1-10; or mild, moderate, severe)   -  MILD (1-3): doesn't interfere with normal activities    -  MODERATE (4-7): interferes with normal activities (e.g., work or school) or awakens from sleep, limping    -  SEVERE (8-10): excruciating pain, unable to do any normal activities, unable to walk     10/10.  4. WORK OR EXERCISE: "Has there been any recent work or exercise that involved this part of the body?"      Patient has been doing more walking yesterday and physical therapy last week.  5. CAUSE: "What do you think is causing the leg pain?"     Patient states this pain is something new.  6. OTHER SYMPTOMS: "Do you have any other  symptoms?" (e.g., chest pain, back pain, breathing difficulty, swelling, rash, fever, numbness, weakness)     Patient denies chest pain, difficulty breathing, swelling, warmth, rash, falls, weakness or numbness in leg.  7. PREGNANCY: "Is there any chance you are pregnant?" "When was your last menstrual period?"     N/A.  Protocols used: Leg Pain-A-AH

## 2023-08-29 ENCOUNTER — Encounter: Payer: Self-pay | Admitting: Family Medicine

## 2023-08-29 ENCOUNTER — Other Ambulatory Visit: Payer: Self-pay | Admitting: Family Medicine

## 2023-08-29 ENCOUNTER — Ambulatory Visit (INDEPENDENT_AMBULATORY_CARE_PROVIDER_SITE_OTHER): Admitting: Family Medicine

## 2023-08-29 VITALS — BP 154/92 | HR 96 | Temp 98.1°F | Ht 64.0 in | Wt 283.8 lb

## 2023-08-29 DIAGNOSIS — D62 Acute posthemorrhagic anemia: Secondary | ICD-10-CM | POA: Diagnosis not present

## 2023-08-29 DIAGNOSIS — D131 Benign neoplasm of stomach: Secondary | ICD-10-CM | POA: Diagnosis not present

## 2023-08-29 DIAGNOSIS — Z87442 Personal history of urinary calculi: Secondary | ICD-10-CM

## 2023-08-29 DIAGNOSIS — K760 Fatty (change of) liver, not elsewhere classified: Secondary | ICD-10-CM

## 2023-08-29 DIAGNOSIS — I10 Essential (primary) hypertension: Secondary | ICD-10-CM | POA: Diagnosis not present

## 2023-08-29 DIAGNOSIS — J449 Chronic obstructive pulmonary disease, unspecified: Secondary | ICD-10-CM | POA: Diagnosis not present

## 2023-08-29 DIAGNOSIS — Z7901 Long term (current) use of anticoagulants: Secondary | ICD-10-CM

## 2023-08-29 DIAGNOSIS — I5032 Chronic diastolic (congestive) heart failure: Secondary | ICD-10-CM

## 2023-08-29 DIAGNOSIS — I110 Hypertensive heart disease with heart failure: Secondary | ICD-10-CM | POA: Diagnosis not present

## 2023-08-29 DIAGNOSIS — R109 Unspecified abdominal pain: Secondary | ICD-10-CM

## 2023-08-29 DIAGNOSIS — M545 Low back pain, unspecified: Secondary | ICD-10-CM | POA: Diagnosis not present

## 2023-08-29 DIAGNOSIS — E119 Type 2 diabetes mellitus without complications: Secondary | ICD-10-CM | POA: Diagnosis not present

## 2023-08-29 LAB — POC URINALSYSI DIPSTICK (AUTOMATED)
Bilirubin, UA: POSITIVE
Blood, UA: NEGATIVE
Glucose, UA: POSITIVE — AB
Ketones, UA: NEGATIVE
Leukocytes, UA: NEGATIVE
Nitrite, UA: NEGATIVE
Protein, UA: POSITIVE — AB
Spec Grav, UA: >=1.03 — AB (ref 1.010–1.025)
Urobilinogen, UA: 0.2 U/dL — AB
pH, UA: 5 (ref 5.0–8.0)

## 2023-08-29 MED ORDER — TAMSULOSIN HCL 0.4 MG PO CAPS: 0.4000 mg | ORAL_CAPSULE | Freq: Every day | ORAL | 0 refills | Status: DC

## 2023-08-29 MED ORDER — TRAMADOL HCL 50 MG PO TABS: 50.0000 mg | ORAL_TABLET | Freq: Two times a day (BID) | ORAL | 0 refills | Status: AC | PRN

## 2023-08-29 NOTE — Progress Notes (Signed)
 Established Patient Office Visit   Subjective  Patient ID: [name], female    DOB: 10-27-1955  Age: 68 y.o. MRN: [id number]  Chief Complaint  Patient presents with   Medical Management of Chronic Issues    Patient came in today for lower right side back stomach hip and leg pain, rate of pain 8 out of 10     Patient is a 68 year old female seen for ongoing concerns.  Yesterday, during occupational therapy, pt w/ severe pain in back, near the kidneys, radiating down her leg. The pain was so intense that she was unable to walk and required assistance to return to her apartment. Pain isimilar to labor contractions, stating it 'hits hard' and causes her to cry and move her hands to cope.  Pt had similar pain yrs ago from waste building up in her kidneys. She recalls being told that her kidneys were in bad shape. She has noticed a decrease in urination and has been drinking less water , now consuming about a bottle and a half of water  and cranberry juice daily.  She took eight Tylenol  yesterday to manage the pain, which provided some relief but the pain returned. The pain radiates to the front of her leg and is described as intense. She has experienced changes in the smell of her urine over the past few days and reports feeling hot and cold intermittently.  Her blood sugar was 184 this morning, which she attributes to drinking cranberry juice. No nausea or vomiting unless she eats something that disagrees with her. She experiences lightheadedness, which she associates with not drinking enough fluids. Her blood pressure fluctuates, sometimes rising and then dropping, which is a new occurrence for her.    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    Chronic idiopathic constipation 03/05/2023   Gastro-esophageal reflux disease without esophagitis 03/05/2023   Iron  deficiency anemia 03/05/2023   Screening for malignant neoplasm of colon 03/05/2023   Generalized anxiety disorder 10/19/2022   Major depressive disorder, recurrent, moderate (HCC) 10/19/2022   Anemia of chronic disease 08/23/2022   Left knee pain 08/23/2022   HLD (hyperlipidemia) 10/07/2021   Paroxysmal SVT (supraventricular tachycardia) (HCC) 10/07/2021   Allergic rhinitis 11/29/2020   Allergic rhinitis due to pollen 11/29/2020   Moderate persistent asthma without complication 11/29/2020   Osteoarthritis of spine with radiculopathy, lumbar region 08/24/2020   Chronic back pain 08/18/2020   Obesity, diabetes, and hypertension syndrome (HCC) 02/27/2020   Vitreomacular adhesion of both eyes 02/09/2020   Pseudophakia of both eyes 02/09/2020   Chronic diastolic CHF (congestive heart failure) (HCC) 03/12/2019   Hypernatremia 03/09/2019   Suspected COVID-19 virus infection 03/09/2019   Thyroid  mass 02/27/2017   Acute pain of right lower extremity 05/24/2016   Effusion, right knee 04/28/2016   Presence of right artificial knee joint 04/28/2016   Abnormal LFTs    Decreased sensation    Paresthesia 06/12/2014   Obstruction of kidney 04/24/2014   Chronic anticoagulation 04/24/2014   Crack cocaine use 04/24/2014   Depression 04/24/2014   Fibromyalgia 04/24/2014   Obstructive sleep apnea on CPAP 04/24/2014   Lupus anticoagulant positive 04/24/2014   Adrenal mass, right (HCC) 04/24/2014   Right kidney mass 04/24/2014   Hydronephrosis of right kidney  04/24/2014   Supratherapeutic INR 04/24/2014   Renal mass 04/24/2014   Chronic obstructive pulmonary disease (HCC) 04/24/2014   Right lower quadrant abdominal pain    Morbid obesity (HCC) 08/21/2013   Constipation 08/19/2013   Lumbosacral spondylosis without myelopathy 11/19/2012   Morbid obesity with BMI of 45.0-49.9, adult  (HCC) 01/05/2012   Bilateral deep vein thromboses (HCC) 12/13/2011   Hypokalemia 11/05/2011   Sciatica 11/05/2011   Essential hypertension 11/05/2011   Diabetes type 2, controlled (HCC) 11/05/2011   Past Medical History:  Diagnosis Date   Adrenal mass (HCC) 03/226   Benign   Arthritis    knees/multiple orthopedic conditons; lower back   Asthma    per pt   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   COPD (chronic obstructive pulmonary disease) (HCC)    inhalers dependent on environment   Depression    Diabetes mellitus    120s usually fasting -  dx more than 10 yrs ago   Dizziness, nonspecific    DVT (deep venous thrombosis) (HCC)    Recurrent   Fibromyalgia    GERD (gastroesophageal reflux disease)    Heart murmur    History of blood transfusion    History of cocaine abuse (HCC)    Remote history    Hyperlipidemia    Hypertension    takes meds daily   Hypothyroidism    Lupus Anticoagulant Positive    On home oxygen  therapy    at night   Shortness of breath    exertion or lying flat   Sleep apnea    2l of oxygen  at night (as of 12/6, she used to)   Past Surgical History:  Procedure Laterality Date   ABDOMINAL HYSTERECTOMY  1989   Fibroids   ANTERIOR LUMBAR FUSION  01/03/2012   Procedure: ANTERIOR LUMBAR FUSION 1 LEVEL;  Surgeon: Pasty Bongo, MD;  Location: [facility];  Service: Neurosurgery;  Laterality: N/A;  Lumbar Four-Five Anterior Lumbar Interbody Fusion with Instrumentation   BACK SURGERY     cervical spine---disk disease   CARPAL TUNNEL RELEASE     Bilateral   CESAREAN SECTION     CHOLECYSTECTOMY     CYSTOSCOPY/RETROGRADE/URETEROSCOPY/STONE EXTRACTION WITH BASKET Right 04/26/2014   Procedure: CYSTOSCOPY/ RIGHT RETROGRADE/ RIGHT URETEROSCOPY/URETERAL AND RENAL PELVIS BIOPSY;  Surgeon: Osborn Blaze, MD;  Location: [facility];  Service: Urology;  Laterality: Right;   ESOPHAGOGASTRODUODENOSCOPY (EGD) WITH PROPOFOL  N/A 08/25/2022    Procedure: ESOPHAGOGASTRODUODENOSCOPY (EGD) WITH PROPOFOL ;  Surgeon: Alvis Jourdain, MD;  Location: [facility];  Service: Gastroenterology;  Laterality: N/A;   gallstones removed     HEMOSTASIS CLIP PLACEMENT  08/25/2022   Procedure: HEMOSTASIS CLIP PLACEMENT;  Surgeon: Alvis Jourdain, MD;  Location: [facility];  Service: Gastroenterology;;   HOT HEMOSTASIS N/A 08/25/2022   Procedure: HOT HEMOSTASIS (ARGON PLASMA COAGULATION/BICAP);  Surgeon: Alvis Jourdain, MD;  Location: [facility];  Service: Gastroenterology;  Laterality: N/A;   POLYPECTOMY  08/25/2022   Procedure: POLYPECTOMY;  Surgeon: Alvis Jourdain, MD;  Location: [facility];  Service: Gastroenterology;;   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   bilateral   right elbow surgery     RIGHT HEART CATH N/A 02/28/2021   Procedure: RIGHT HEART CATH;  Surgeon: Odie Benne, MD;  Location: [facility] CV LAB;  Service: Cardiovascular;  Laterality: N/A;   THYROID  LOBECTOMY Right 02/27/2017   THYROID  LOBECTOMY Right 02/27/2017   Procedure: RIGHT THYROID  LOBECTOMY;  Surgeon: Sim Dryer, MD;  Location: [facility];  Service:  General;  Laterality: Right;   TUBAL LIGATION     Social History   Tobacco Use   Smoking status: Never   Smokeless tobacco: Never  Vaping Use   Vaping status: Never Used  Substance Use Topics   Alcohol use: No    Alcohol/week: 0.0 standard drinks of alcohol    Comment: beer in past    Drug use: Yes    Types: Cocaine    Comment: quit 2003-rehab progam in 2005   Family History  Problem Relation Age of Onset   Hypertension Mother    Diabetes Mother    Cancer Mother        Pancreatic   Hypertension Father    Heart failure Father    Hyperlipidemia Other    COPD Other    Allergies  Allergen Reactions   Lisinopril  Anaphylaxis, Swelling and Other (See Comments)    THROAT SWELLING, Pt states her throat started to close up after being on it for so long.    Prednisone  Anaphylaxis, Swelling and Other (See  Comments)    Throat swelling and tongue irritation   Spironolactone  Other (See Comments)    Caused stroke-like symptoms with the mouth   Corticosteroids Rash    States she developed a rash on her tongue after a steroid injection in her back    ROS Negative unless stated above    Objective:      BP (!) 154/92 (BP Location: Left Arm, Patient Position: Sitting, Cuff Size: Normal)   Pulse 96   Temp 98.1 F (36.7 C) (Oral)   Ht 5' 4 (1.626 m)   Wt 283 lb 12.8 oz (128.7 kg)   SpO2 95%   BMI 48.71 kg/m  BP Readings from Last 3 Encounters:  08/29/23 (!) 154/92  08/27/23 108/64  08/22/23 110/72   Wt Readings from Last 3 Encounters:  08/29/23 283 lb 12.8 oz (128.7 kg)  08/27/23 284 lb (128.8 kg)  08/22/23 285 lb 3.2 oz (129.4 kg)      Physical Exam Constitutional:      General: She is not in acute distress.    Appearance: Normal appearance.  HENT:     Head: Normocephalic and atraumatic.     Nose: Nose normal.     Mouth/Throat:     Mouth: Mucous membranes are moist.   Cardiovascular:     Rate and Rhythm: Normal rate and regular rhythm.     Heart sounds: Normal heart sounds. No murmur heard.    No gallop.  Pulmonary:     Effort: Pulmonary effort is normal. No respiratory distress.     Breath sounds: Normal breath sounds. No wheezing, rhonchi or rales.  Abdominal:     Tenderness: There is no right CVA tenderness or left CVA tenderness.   Skin:    General: Skin is warm and dry.   Neurological:     Mental Status: She is alert and oriented to person, place, and time.        08/22/2023   11:34 AM 07/11/2023   10:33 AM 06/11/2023    2:25 PM  Depression screen PHQ 2/9  Decreased Interest 1 1 1   Down, Depressed, Hopeless 1 1 1   PHQ - 2 Score 2 2 2   Altered sleeping 0 1 1  Tired, decreased energy 2 3 1   Change in appetite 2 1 1   Feeling bad or failure about yourself  1 1 1   Trouble concentrating 2 1 1   Moving slowly or fidgety/restless 2 1 0  Suicidal thoughts  0 0 0  PHQ-9 Score 11 10 7   Difficult doing work/chores  Somewhat difficult Somewhat difficult      08/22/2023   11:35 AM 07/11/2023   10:33 AM 06/11/2023    2:25 PM 12/22/2022    8:40 AM  GAD 7 : Generalized Anxiety Score  Nervous, Anxious, on Edge 1 1 1 1   Control/stop worrying 1 1 1 1   Worry too much - different things 1 1 1 1   Trouble relaxing 1 1 1  0  Restless 0 0 1 1  Easily annoyed or irritable 1 1 1  0  Afraid - awful might happen 2 0 0 1  Total GAD 7 Score 7 5 6 5   Anxiety Difficulty  Somewhat difficult  Somewhat difficult     Results for orders placed or performed in visit on 08/29/23  Urine Culture   Specimen: Urine  Result Value Ref Range   MICRO NUMBER: [id number]    SPECIMEN QUALITY: Adequate    Sample Source URINE    STATUS: FINAL    Result:      Less than 10,000 CFU/mL of single Gram positive organism isolated. No further testing will be performed. If clinically indicated, recollection using a method to minimize contamination, with prompt transfer to Urine Culture Transport Tube, is recommended.  MICROSCOPIC MESSAGE  Result Value Ref Range   Note    Urinalysis, Routine w reflex microscopic  Result Value Ref Range   Color, Urine DARK YELLOW YELLOW   APPearance CLOUDY (A) CLEAR   Specific Gravity, Urine 1.033 1.001 - 1.035   pH < OR = 5.0 (A) 5.0 - 8.0   Glucose, UA 3+ (A) NEGATIVE   Bilirubin Urine NEGATIVE NEGATIVE   Ketones, ur TRACE (A) NEGATIVE   Hgb urine dipstick NEGATIVE NEGATIVE   Protein, ur 1+ (A) NEGATIVE   Nitrite NEGATIVE NEGATIVE   Leukocytes,Ua TRACE (A) NEGATIVE   WBC, UA 6-10 (A) 0 - 5 /HPF   RBC / HPF 3-10 (A) 0 - 2 /HPF   Squamous Epithelial / HPF 40-60 (A) < OR = 5 /HPF   Bacteria, UA FEW (A) NONE SEEN /HPF   Hyaline Cast 0-5 (A) NONE SEEN /LPF   Yeast MODERATE (A) NONE SEEN /HPF  POCT Urinalysis Dipstick (Automated)  Result Value Ref Range   Color, UA dark yellow    Clarity, UA cloudy    Glucose, UA Positive (A) Negative    Bilirubin, UA pos    Ketones, UA neg    Spec Grav, UA >=1.030 (A) 1.010 - 1.025   Blood, UA neg    pH, UA 5.0 5.0 - 8.0   Protein, UA Positive (A) Negative   Urobilinogen, UA 0.2 (A) 0.2 or 1.0 E.U./dL   Nitrite, UA neg    Leukocytes, UA Negative Negative      Assessment & Plan:   Abdominal pain, unspecified abdominal location -     POCT Urinalysis Dipstick (Automated) -     Urinalysis, Routine w reflex microscopic; Future -     Urine Culture -     Ambulatory referral to Urology -     traMADol  HCl; Take 1 tablet (50 mg total) by mouth every 12 (twelve) hours as needed for up to 5 days for severe pain (pain score 7-10).  Dispense: 15 tablet; Refill: 0 -     Tamsulosin  HCl; Take 1 capsule (0.4 mg total) by mouth daily.  Dispense: 30 capsule; Refill: 0 -     MICROSCOPIC  MESSAGE  Acute right-sided low back pain without sciatica -     Ambulatory referral to Urology -     traMADol  HCl; Take 1 tablet (50 mg total) by mouth every 12 (twelve) hours as needed for up to 5 days for severe pain (pain score 7-10).  Dispense: 15 tablet; Refill: 0  History of renal calculi -     Ambulatory referral to Urology -     Tamsulosin  HCl; Take 1 capsule (0.4 mg total) by mouth daily.  Dispense: 30 capsule; Refill: 0  Essential hypertension  Chronic diastolic CHF (congestive heart failure) (HCC)  Chronic anticoagulation  Hepatic steatosis  Patient with continued abdominal/flank pain, fatigue.  Recent labs 08/27/23 and workup negative.  MR abdomen and pelvis from 08/02/2023 with unchanged right adrenal nodule measuring 3.3 x 2.6 cm which demonstrates significant internal macroscopic fat content on in and opposed phase imaging.  This has been seen on numerous prior examinations of the abdomen dating back to at least 2013 and is unchanged over the long period of follow-up.  This is consistent with a benign adenoma with both lipid rich and lipid poor elements, and extensively documented imaging stability is  reassuring of a benign nature, requiring no further follow-up or characterization in the absence of endocrine symptoms of a functioning adenoma.  Mild hepatic steatosis.  POC UA this visit with leuks, glucosuria likely from DM medication.  SG>1.030.  pt advised to increase po intake of water  carefully due to CHF.  Currently euvolemic.   Start Flomax .  Limited amt of Tramadol  given for pain.  D/t h/o similar pain a/w renal issues requiring ureteroscopy referral to Urology placed.  Patient given strict ED precautions for uncontrolled pain/worsening symptoms.  BP elevated this visit.  Possibly 2/2 increased pain/not taking medication.  Previously well-controlled at recent visit.  Continue Coumadin  for history of DVT.  Diet modifications for hepatic steatosis.  Previously on Crestor .   Return if symptoms worsen or fail to improve.   Viola Greulich, MD

## 2023-08-29 NOTE — Patient Instructions (Addendum)
 A prescription for Flomax which helps increase your urine stream and tramadol  a medication for pain were sent to your local [facility] on [address] and [address].  Remember pain medication can cause constipation so only use it if you are having severe pain.  It is important that you increase the amount of water  you are drinking to help with your kidneys.  The labs you had on 08/27/2023 looked good regarding your kidney function.  A referral to the urologist was placed.  You should contact their office to schedule an appointment.  If you feel like your symptoms are getting worse/are hard to control go to the emergency room.

## 2023-08-30 LAB — URINALYSIS, ROUTINE W REFLEX MICROSCOPIC
Bilirubin Urine: NEGATIVE
Hgb urine dipstick: NEGATIVE
Nitrite: NEGATIVE
Specific Gravity, Urine: 1.033 (ref 1.001–1.035)
pH: <=5 — AB (ref 5.0–8.0)

## 2023-08-30 LAB — URINE CULTURE
MICRO NUMBER:: 16566979
SPECIMEN QUALITY:: ADEQUATE

## 2023-08-30 LAB — MICROSCOPIC MESSAGE

## 2023-09-03 DIAGNOSIS — E119 Type 2 diabetes mellitus without complications: Secondary | ICD-10-CM | POA: Diagnosis not present

## 2023-09-03 DIAGNOSIS — I5032 Chronic diastolic (congestive) heart failure: Secondary | ICD-10-CM | POA: Diagnosis not present

## 2023-09-03 DIAGNOSIS — D62 Acute posthemorrhagic anemia: Secondary | ICD-10-CM | POA: Diagnosis not present

## 2023-09-03 DIAGNOSIS — D131 Benign neoplasm of stomach: Secondary | ICD-10-CM | POA: Diagnosis not present

## 2023-09-03 DIAGNOSIS — I110 Hypertensive heart disease with heart failure: Secondary | ICD-10-CM | POA: Diagnosis not present

## 2023-09-03 DIAGNOSIS — J449 Chronic obstructive pulmonary disease, unspecified: Secondary | ICD-10-CM | POA: Diagnosis not present

## 2023-09-03 DIAGNOSIS — G4733 Obstructive sleep apnea (adult) (pediatric): Secondary | ICD-10-CM | POA: Diagnosis not present

## 2023-09-04 ENCOUNTER — Telehealth: Payer: Self-pay

## 2023-09-04 NOTE — Telephone Encounter (Signed)
 Copied from CRM [id number]. Topic: General - Other >> Sep 03, 2023  4:05 PM Jenice Mitts wrote: Reason for CRM: Patient is calling because she received a letter from [facility] stated they never received any paperwork from the doctor so she can get her incontinence supplies  So she is just calling to see if that can be sent to them

## 2023-09-04 NOTE — Telephone Encounter (Signed)
 Paperwork has refaxed and conformation received today, first fax sent 08/08/23 with fax conformation received

## 2023-09-07 DIAGNOSIS — D3500 Benign neoplasm of unspecified adrenal gland: Secondary | ICD-10-CM | POA: Diagnosis not present

## 2023-09-07 DIAGNOSIS — M545 Low back pain, unspecified: Secondary | ICD-10-CM | POA: Diagnosis not present

## 2023-09-07 DIAGNOSIS — J449 Chronic obstructive pulmonary disease, unspecified: Secondary | ICD-10-CM | POA: Diagnosis not present

## 2023-09-07 DIAGNOSIS — I5032 Chronic diastolic (congestive) heart failure: Secondary | ICD-10-CM | POA: Diagnosis not present

## 2023-09-07 DIAGNOSIS — I110 Hypertensive heart disease with heart failure: Secondary | ICD-10-CM | POA: Diagnosis not present

## 2023-09-07 DIAGNOSIS — D62 Acute posthemorrhagic anemia: Secondary | ICD-10-CM | POA: Diagnosis not present

## 2023-09-07 DIAGNOSIS — D131 Benign neoplasm of stomach: Secondary | ICD-10-CM | POA: Diagnosis not present

## 2023-09-07 DIAGNOSIS — E119 Type 2 diabetes mellitus without complications: Secondary | ICD-10-CM | POA: Diagnosis not present

## 2023-09-10 ENCOUNTER — Other Ambulatory Visit: Payer: Self-pay | Admitting: Internal Medicine

## 2023-09-10 ENCOUNTER — Telehealth: Payer: Self-pay

## 2023-09-10 ENCOUNTER — Other Ambulatory Visit: Payer: Self-pay | Admitting: Family Medicine

## 2023-09-10 ENCOUNTER — Ambulatory Visit: Payer: Self-pay | Admitting: Family Medicine

## 2023-09-10 DIAGNOSIS — D62 Acute posthemorrhagic anemia: Secondary | ICD-10-CM | POA: Diagnosis not present

## 2023-09-10 DIAGNOSIS — I5032 Chronic diastolic (congestive) heart failure: Secondary | ICD-10-CM

## 2023-09-10 DIAGNOSIS — I10 Essential (primary) hypertension: Secondary | ICD-10-CM

## 2023-09-10 DIAGNOSIS — E1169 Type 2 diabetes mellitus with other specified complication: Secondary | ICD-10-CM

## 2023-09-10 DIAGNOSIS — J449 Chronic obstructive pulmonary disease, unspecified: Secondary | ICD-10-CM | POA: Diagnosis not present

## 2023-09-10 DIAGNOSIS — I110 Hypertensive heart disease with heart failure: Secondary | ICD-10-CM | POA: Diagnosis not present

## 2023-09-10 DIAGNOSIS — D131 Benign neoplasm of stomach: Secondary | ICD-10-CM | POA: Diagnosis not present

## 2023-09-10 DIAGNOSIS — E119 Type 2 diabetes mellitus without complications: Secondary | ICD-10-CM | POA: Diagnosis not present

## 2023-09-10 DIAGNOSIS — E1140 Type 2 diabetes mellitus with diabetic neuropathy, unspecified: Secondary | ICD-10-CM

## 2023-09-10 NOTE — Telephone Encounter (Signed)
 Copied from CRM [id number]. Topic: Clinical - Home Health Verbal Orders >> Sep 10, 2023 11:27 AM Larissa RAMAN wrote: Caller/Agency: Marko, PT/ [facility] Callback Number: [phone] Service Requested: Physical Therapy Frequency: 1 x 2 weeks with re-certification Any new concerns about the patient? No

## 2023-09-10 NOTE — Telephone Encounter (Signed)
 Called and left a VM, Monique, PT/ [facility], VO for PT per Dr. Mercer

## 2023-09-11 ENCOUNTER — Telehealth: Payer: Self-pay

## 2023-09-11 NOTE — Telephone Encounter (Signed)
 Copied from CRM [id number]. Topic: Clinical - Home Health Verbal Orders >> Sep 11, 2023  2:47 PM Thersia BROCKS wrote: Caller/Agency: Rosaline Rushing Number: [phone] Service Requested: Occupational Therapy Frequency: extend 2 times a week for 2 weeks  Any new concerns about the patient? No

## 2023-09-12 ENCOUNTER — Other Ambulatory Visit: Payer: Self-pay | Admitting: Family Medicine

## 2023-09-12 DIAGNOSIS — J4540 Moderate persistent asthma, uncomplicated: Secondary | ICD-10-CM

## 2023-09-12 MED ORDER — ALBUTEROL SULFATE HFA 108 (90 BASE) MCG/ACT IN AERS: INHALATION_SPRAY | RESPIRATORY_TRACT | 10 refills | Status: AC

## 2023-09-12 NOTE — Telephone Encounter (Signed)
 Ok

## 2023-09-12 NOTE — Telephone Encounter (Signed)
 Called and spoke with Rosaline from [facility], VO given for O.T.

## 2023-09-12 NOTE — Telephone Encounter (Signed)
 Copied from CRM [id number]. Topic: Clinical - Medication Refill >> Sep 12, 2023 10:50 AM Tanazia G wrote: Medication:  albuterol  (VENTOLIN  HFA) 108 (90 Base) MCG/ACT inhaler   Has the patient contacted their pharmacy? Yes (Agent: If no, request that the patient contact the pharmacy for the refill. If patient does not wish to contact the pharmacy document the reason why and proceed with request.) (Agent: If yes, when and what did the pharmacy advise?)   [facility] - [place], [place] - [address] [place] [address] Phone: [phone] Fax: [phone]  Is this the correct pharmacy for this prescription? Yes If no, delete pharmacy and type the correct one.   Has the prescription been filled recently? Yes  Is the patient out of the medication? Yes  Has the patient been seen for an appointment in the last year OR does the patient have an upcoming appointment? Yes  Can we respond through MyChart? Yes  Agent: Please be advised that Rx refills may take up to 3 business days. We ask that you follow-up with your pharmacy.

## 2023-09-13 DIAGNOSIS — I5032 Chronic diastolic (congestive) heart failure: Secondary | ICD-10-CM | POA: Diagnosis not present

## 2023-09-13 DIAGNOSIS — E119 Type 2 diabetes mellitus without complications: Secondary | ICD-10-CM | POA: Diagnosis not present

## 2023-09-13 DIAGNOSIS — D62 Acute posthemorrhagic anemia: Secondary | ICD-10-CM | POA: Diagnosis not present

## 2023-09-13 DIAGNOSIS — D131 Benign neoplasm of stomach: Secondary | ICD-10-CM | POA: Diagnosis not present

## 2023-09-13 DIAGNOSIS — I110 Hypertensive heart disease with heart failure: Secondary | ICD-10-CM | POA: Diagnosis not present

## 2023-09-13 DIAGNOSIS — J449 Chronic obstructive pulmonary disease, unspecified: Secondary | ICD-10-CM | POA: Diagnosis not present

## 2023-09-17 ENCOUNTER — Other Ambulatory Visit: Payer: Self-pay | Admitting: Family Medicine

## 2023-09-17 DIAGNOSIS — D62 Acute posthemorrhagic anemia: Secondary | ICD-10-CM | POA: Diagnosis not present

## 2023-09-17 DIAGNOSIS — G8929 Other chronic pain: Secondary | ICD-10-CM

## 2023-09-17 DIAGNOSIS — D131 Benign neoplasm of stomach: Secondary | ICD-10-CM | POA: Diagnosis not present

## 2023-09-17 DIAGNOSIS — E119 Type 2 diabetes mellitus without complications: Secondary | ICD-10-CM | POA: Diagnosis not present

## 2023-09-17 DIAGNOSIS — I5032 Chronic diastolic (congestive) heart failure: Secondary | ICD-10-CM | POA: Diagnosis not present

## 2023-09-17 DIAGNOSIS — I110 Hypertensive heart disease with heart failure: Secondary | ICD-10-CM | POA: Diagnosis not present

## 2023-09-17 DIAGNOSIS — J449 Chronic obstructive pulmonary disease, unspecified: Secondary | ICD-10-CM | POA: Diagnosis not present

## 2023-09-17 MED ORDER — TRAMADOL HCL 50 MG PO TABS: 50.0000 mg | ORAL_TABLET | Freq: Two times a day (BID) | ORAL | 0 refills | Status: DC | PRN

## 2023-09-18 ENCOUNTER — Ambulatory Visit (INDEPENDENT_AMBULATORY_CARE_PROVIDER_SITE_OTHER): Admitting: Podiatry

## 2023-09-18 ENCOUNTER — Encounter: Payer: Self-pay | Admitting: Podiatry

## 2023-09-18 DIAGNOSIS — D62 Acute posthemorrhagic anemia: Secondary | ICD-10-CM | POA: Diagnosis not present

## 2023-09-18 DIAGNOSIS — M79674 Pain in right toe(s): Secondary | ICD-10-CM

## 2023-09-18 DIAGNOSIS — M79675 Pain in left toe(s): Secondary | ICD-10-CM | POA: Diagnosis not present

## 2023-09-18 DIAGNOSIS — I110 Hypertensive heart disease with heart failure: Secondary | ICD-10-CM | POA: Diagnosis not present

## 2023-09-18 DIAGNOSIS — B351 Tinea unguium: Secondary | ICD-10-CM

## 2023-09-18 DIAGNOSIS — E1142 Type 2 diabetes mellitus with diabetic polyneuropathy: Secondary | ICD-10-CM | POA: Diagnosis not present

## 2023-09-18 DIAGNOSIS — E1165 Type 2 diabetes mellitus with hyperglycemia: Secondary | ICD-10-CM | POA: Diagnosis not present

## 2023-09-18 DIAGNOSIS — I5032 Chronic diastolic (congestive) heart failure: Secondary | ICD-10-CM | POA: Diagnosis not present

## 2023-09-18 NOTE — Progress Notes (Signed)
 Subjective:  Patient ID: [name], female    DOB: 12/28/1955,  MRN: [id number]  68 y.o. female presents at risk foot care with history of diabetic neuropathy and painful thick toenails that are difficult to trim. Pain interferes with ambulation. Aggravating factors include wearing enclosed shoe gear. Pain is relieved with periodic professional debridement. Chief Complaint  Patient presents with   Diabetes    My toenails  Dr. Clotilda Single - 08/29/2023; A1c - 6.5    New problem(s): None   PCP is Single Clotilda SAUNDERS, MD.  Allergies  Allergen Reactions   Lisinopril  Anaphylaxis, Swelling and Other (See Comments)    THROAT SWELLING, Pt states her throat started to close up after being on it for so long.    Prednisone  Anaphylaxis, Swelling and Other (See Comments)    Throat swelling and tongue irritation   Spironolactone  Other (See Comments)    Caused stroke-like symptoms with the mouth   Corticosteroids Rash    States she developed a rash on her tongue after a steroid injection in her back    Review of Systems: Negative except as noted in the HPI.   Objective:  [name] is a pleasant 68 y.o. female morbidly obese in NAD. AAO x 3.  Vascular Examination: Vascular status intact b/l with palpable pedal pulses. CFT immediate b/l. Pedal hair present. No edema. No pain with calf compression b/l. Skin temperature gradient WNL b/l. No varicosities noted. No cyanosis or clubbing noted.  Neurological Examination: Pt has subjective symptoms of neuropathy. Protective sensation diminished with 10g monofilament b/l.  Dermatological Examination: Pedal skin warm and supple b/l. No open wounds b/l. No interdigital macerations. Toenails 2-5 b/l thick, discolored, elongated with subungual debris and pain on dorsal palpation.  Anonychia noted bilateral great toes. Nailbed(s) epithelialized with dorsal hyperkeratosis.   Musculoskeletal Examination: Muscle strength 5/5 to all  lower extremity muscle groups bilaterally. Pes planus deformity noted bilateral LE.  Radiographs: None  Last A1c:      Latest Ref Rng & Units 07/28/2023    9:54 AM 07/11/2023   10:01 AM 12/04/2022    9:29 AM  Hemoglobin A1C  Hemoglobin-A1c 4.8 - 5.6 % 6.5  6.2  7.5    Assessment:   1. Pain due to onychomycosis of toenails of both feet   2. Diabetic peripheral neuropathy associated with type 2 diabetes mellitus (HCC)    Plan:  -Consent given for treatment as described below: -Examined patient. -Continue foot and shoe inspections daily. Monitor blood glucose per PCP/Endocrinologist's recommendations. -Patient to continue soft, supportive shoe gear daily. -Mycotic toenails 2-5 bilaterally were debrided in length and girth with sterile nail nippers and dremel without iatrogenic bleeding. -As a courtesy, callus(es) dorsal nailbed b/l great toes pared utilizing sterile scalpel blade. Pinpoint bleeding left great toe addressed with Lumicain Hemostatic Solution. Total number pared=2. -Patient/POA to call should there be question/concern in the interim.  Return in about 3 months (around 12/19/2023).  Delon LITTIE Merlin, DPM      [place] LOCATION: [address] [place] [address]                   Office [phone]   [place] LOCATION: [phone] [address]  [place], [place] [address] Office [phone]

## 2023-09-19 DIAGNOSIS — E1165 Type 2 diabetes mellitus with hyperglycemia: Secondary | ICD-10-CM | POA: Diagnosis not present

## 2023-09-19 DIAGNOSIS — I5032 Chronic diastolic (congestive) heart failure: Secondary | ICD-10-CM | POA: Diagnosis not present

## 2023-09-19 DIAGNOSIS — I110 Hypertensive heart disease with heart failure: Secondary | ICD-10-CM | POA: Diagnosis not present

## 2023-09-19 DIAGNOSIS — D62 Acute posthemorrhagic anemia: Secondary | ICD-10-CM | POA: Diagnosis not present

## 2023-09-24 DIAGNOSIS — D62 Acute posthemorrhagic anemia: Secondary | ICD-10-CM | POA: Diagnosis not present

## 2023-09-24 DIAGNOSIS — I5032 Chronic diastolic (congestive) heart failure: Secondary | ICD-10-CM | POA: Diagnosis not present

## 2023-09-24 DIAGNOSIS — E1165 Type 2 diabetes mellitus with hyperglycemia: Secondary | ICD-10-CM | POA: Diagnosis not present

## 2023-09-24 DIAGNOSIS — I110 Hypertensive heart disease with heart failure: Secondary | ICD-10-CM | POA: Diagnosis not present

## 2023-09-25 DIAGNOSIS — I110 Hypertensive heart disease with heart failure: Secondary | ICD-10-CM | POA: Diagnosis not present

## 2023-09-25 DIAGNOSIS — I5032 Chronic diastolic (congestive) heart failure: Secondary | ICD-10-CM | POA: Diagnosis not present

## 2023-09-25 DIAGNOSIS — D62 Acute posthemorrhagic anemia: Secondary | ICD-10-CM | POA: Diagnosis not present

## 2023-09-25 DIAGNOSIS — E1165 Type 2 diabetes mellitus with hyperglycemia: Secondary | ICD-10-CM | POA: Diagnosis not present

## 2023-09-26 ENCOUNTER — Inpatient Hospital Stay: Attending: Oncology

## 2023-09-26 ENCOUNTER — Ambulatory Visit: Payer: Self-pay | Admitting: Oncology

## 2023-09-26 DIAGNOSIS — Z7901 Long term (current) use of anticoagulants: Secondary | ICD-10-CM

## 2023-09-26 DIAGNOSIS — Z86718 Personal history of other venous thrombosis and embolism: Secondary | ICD-10-CM | POA: Diagnosis not present

## 2023-09-26 DIAGNOSIS — D6862 Lupus anticoagulant syndrome: Secondary | ICD-10-CM | POA: Insufficient documentation

## 2023-09-26 LAB — PROTIME-INR
INR: 2.2 — ABNORMAL HIGH (ref 0.8–1.2)
Prothrombin Time: 25.3 s — ABNORMAL HIGH (ref 11.4–15.2)

## 2023-09-26 NOTE — Telephone Encounter (Signed)
 Made Mildreth aware that INR is in therapeutic range. Continue same warfarin that was confirmed as 7.5 mg daily, except 10 mg on MWF. She will return on 8/6 for another INR

## 2023-09-27 ENCOUNTER — Encounter: Payer: Self-pay | Admitting: Family Medicine

## 2023-09-27 ENCOUNTER — Ambulatory Visit: Admitting: Family Medicine

## 2023-09-27 VITALS — BP 132/76 | HR 82 | Temp 98.6°F | Ht 64.0 in | Wt 290.0 lb

## 2023-09-27 DIAGNOSIS — M5442 Lumbago with sciatica, left side: Secondary | ICD-10-CM | POA: Diagnosis not present

## 2023-09-27 DIAGNOSIS — G8929 Other chronic pain: Secondary | ICD-10-CM

## 2023-09-27 DIAGNOSIS — M79651 Pain in right thigh: Secondary | ICD-10-CM

## 2023-09-27 DIAGNOSIS — E1140 Type 2 diabetes mellitus with diabetic neuropathy, unspecified: Secondary | ICD-10-CM

## 2023-09-27 DIAGNOSIS — I10 Essential (primary) hypertension: Secondary | ICD-10-CM | POA: Diagnosis not present

## 2023-09-27 DIAGNOSIS — D649 Anemia, unspecified: Secondary | ICD-10-CM

## 2023-09-27 DIAGNOSIS — M5441 Lumbago with sciatica, right side: Secondary | ICD-10-CM | POA: Diagnosis not present

## 2023-09-27 DIAGNOSIS — M4726 Other spondylosis with radiculopathy, lumbar region: Secondary | ICD-10-CM | POA: Diagnosis not present

## 2023-09-27 LAB — MICROALBUMIN / CREATININE URINE RATIO
Creatinine,U: 89 mg/dL
Microalb Creat Ratio: 73.9 mg/g — ABNORMAL HIGH (ref 0.0–30.0)
Microalb, Ur: 6.6 mg/dL — ABNORMAL HIGH (ref 0.0–1.9)

## 2023-09-27 NOTE — Patient Instructions (Signed)
 An order for an MRI of your lumbar spine was placed.  They will call you about setting this appt up.

## 2023-09-27 NOTE — Progress Notes (Unsigned)
 Established Patient Office Visit   Subjective  Patient ID: [name], female    DOB: 31-Mar-1955  Age: 68 y.o. MRN: [id number]  Chief Complaint  Patient presents with   Medical Management of Chronic Issues    1 month follow-up for Blood pressure and  Abdominal pain, patient states she is still having pain     Pt is a 68 yo female seen for f/u.  Pt still having abd pain.  At last OFV pt felt like pain was similar to prior renal issues.  Sent to Urology who felt like pt was having sciatica as was not having the pain at time of appt, symptoms were attributed to sciatica. Pt denies pain in posterior leg.  Pt acknowledges prior history of sciatica but states this pain has been in anterior leg.  Pain better with exercise, worse after PT.  Pt had surgery on back in the past by Dr. Cabel.  Gabapentin  caused drowsiness.    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   Chronic idiopathic constipation 03/05/2023   Gastro-esophageal reflux disease without esophagitis 03/05/2023   Iron  deficiency anemia 03/05/2023   Screening for malignant neoplasm of colon 03/05/2023   Generalized anxiety disorder 10/19/2022   Major depressive disorder, recurrent, moderate (HCC) 10/19/2022   Anemia of chronic disease 08/23/2022   Left knee pain 08/23/2022   HLD (hyperlipidemia) 10/07/2021   Paroxysmal SVT (supraventricular tachycardia) (HCC) 10/07/2021   Allergic rhinitis 11/29/2020   Allergic rhinitis due to pollen 11/29/2020   Moderate persistent asthma without complication 11/29/2020   Osteoarthritis of spine with radiculopathy, lumbar region 08/24/2020   Chronic back pain 08/18/2020   Obesity, diabetes, and hypertension syndrome (HCC) 02/27/2020   Vitreomacular  adhesion of both eyes 02/09/2020   Pseudophakia of both eyes 02/09/2020   Chronic diastolic CHF (congestive heart failure) (HCC) 03/12/2019   Hypernatremia 03/09/2019   Suspected COVID-19 virus infection 03/09/2019   Thyroid  mass 02/27/2017   Acute pain of right lower extremity 05/24/2016   Effusion, right knee 04/28/2016   Presence of right artificial knee joint 04/28/2016   Abnormal LFTs    Decreased sensation    Paresthesia 06/12/2014   Obstruction of kidney 04/24/2014   Chronic anticoagulation 04/24/2014   Crack cocaine use 04/24/2014   Depression 04/24/2014   Fibromyalgia 04/24/2014   Obstructive sleep apnea on CPAP 04/24/2014   Lupus anticoagulant positive 04/24/2014   Adrenal mass, right (HCC) 04/24/2014   Right kidney mass 04/24/2014   Hydronephrosis of right kidney 04/24/2014   Supratherapeutic INR 04/24/2014   Renal mass 04/24/2014   Chronic obstructive pulmonary disease (HCC) 04/24/2014   Right lower quadrant abdominal pain    Morbid obesity (HCC) 08/21/2013   Constipation 08/19/2013   Lumbosacral spondylosis without myelopathy 11/19/2012   Morbid obesity with BMI of 45.0-49.9, adult (HCC) 01/05/2012   Bilateral deep vein thromboses (HCC) 12/13/2011   Hypokalemia 11/05/2011   Sciatica 11/05/2011   Essential hypertension 11/05/2011   Diabetes type 2, controlled (HCC) 11/05/2011   Past Medical History:  Diagnosis Date   Adrenal mass (HCC) 03/226   Benign   Arthritis    knees/multiple orthopedic conditons; lower back   Asthma    per pt   Clotting disorder (HCC)    +beta-2 -glycoprotein  IgA antibody   COPD (chronic obstructive pulmonary disease) (HCC)    inhalers dependent on environment   Depression    Diabetes mellitus    120s usually fasting -  dx more than 10 yrs ago   Dizziness, nonspecific    DVT (deep venous thrombosis) (HCC)    Recurrent   Fibromyalgia    GERD (gastroesophageal reflux disease)    Heart murmur    History of blood transfusion     History of cocaine abuse (HCC)    Remote history    Hyperlipidemia    Hypertension    takes meds daily   Hypothyroidism    Lupus Anticoagulant Positive    On home oxygen  therapy    at night   Shortness of breath    exertion or lying flat   Sleep apnea    2l of oxygen  at night (as of 12/6, she used to)   Past Surgical History:  Procedure Laterality Date   ABDOMINAL HYSTERECTOMY  1989   Fibroids   ANTERIOR LUMBAR FUSION  01/03/2012   Procedure: ANTERIOR LUMBAR FUSION 1 LEVEL;  Surgeon: Rockey LITTIE Peru, MD;  Location: [facility];  Service: Neurosurgery;  Laterality: N/A;  Lumbar Four-Five Anterior Lumbar Interbody Fusion with Instrumentation   BACK SURGERY     cervical spine---disk disease   CARPAL TUNNEL RELEASE     Bilateral   CESAREAN SECTION     CHOLECYSTECTOMY     CYSTOSCOPY/RETROGRADE/URETEROSCOPY/STONE EXTRACTION WITH BASKET Right 04/26/2014   Procedure: CYSTOSCOPY/ RIGHT RETROGRADE/ RIGHT URETEROSCOPY/URETERAL AND RENAL PELVIS BIOPSY;  Surgeon: Ricardo Likens, MD;  Location: [facility];  Service: Urology;  Laterality: Right;   ESOPHAGOGASTRODUODENOSCOPY (EGD) WITH PROPOFOL  N/A 08/25/2022   Procedure: ESOPHAGOGASTRODUODENOSCOPY (EGD) WITH PROPOFOL ;  Surgeon: Rollin Dover, MD;  Location: [facility];  Service: Gastroenterology;  Laterality: N/A;   gallstones removed     HEMOSTASIS CLIP PLACEMENT  08/25/2022   Procedure: HEMOSTASIS CLIP PLACEMENT;  Surgeon: Rollin Dover, MD;  Location: [facility];  Service: Gastroenterology;;   HOT HEMOSTASIS N/A 08/25/2022   Procedure: HOT HEMOSTASIS (ARGON PLASMA COAGULATION/BICAP);  Surgeon: Rollin Dover, MD;  Location: [facility];  Service: Gastroenterology;  Laterality: N/A;   POLYPECTOMY  08/25/2022   Procedure: POLYPECTOMY;  Surgeon: Rollin Dover, MD;  Location: [facility];  Service: Gastroenterology;;   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   bilateral   right elbow surgery     RIGHT HEART CATH N/A 02/28/2021   Procedure: RIGHT HEART  CATH;  Surgeon: Verlin Lonni BIRCH, MD;  Location: [facility];  Service: Cardiovascular;  Laterality: N/A;   THYROID  LOBECTOMY Right 02/27/2017   THYROID  LOBECTOMY Right 02/27/2017   Procedure: RIGHT THYROID  LOBECTOMY;  Surgeon: Vanderbilt Ned, MD;  Location: [facility];  Service: General;  Laterality: Right;   TUBAL LIGATION     Social History   Tobacco Use   Smoking status: Never   Smokeless tobacco: Never  Vaping Use   Vaping status: Never Used  Substance Use Topics   Alcohol use: No    Alcohol/week: 0.0 standard drinks of alcohol    Comment: beer in past    Drug use: Not Currently    Types: Cocaine    Comment: quit 2003-rehab progam in 2005   Family History  Problem Relation Age of Onset   Hypertension Mother    Diabetes Mother    Cancer Mother        Pancreatic   Hypertension Father    Heart failure Father    Hyperlipidemia  Other    COPD Other    Allergies  Allergen Reactions   Lisinopril  Anaphylaxis, Swelling and Other (See Comments)    THROAT SWELLING, Pt states her throat started to close up after being on it for so long.    Prednisone  Anaphylaxis, Swelling and Other (See Comments)    Throat swelling and tongue irritation   Spironolactone  Other (See Comments)    Caused stroke-like symptoms with the mouth   Corticosteroids Rash    States she developed a rash on her tongue after a steroid injection in her back    ROS Negative unless stated above    Objective:     BP 132/76 (BP Location: Left Arm, Patient Position: Sitting, Cuff Size: Large)   Pulse 82   Temp 98.6 F (37 C) (Oral)   Ht 5' 4 (1.626 m)   Wt 290 lb (131.5 kg)   SpO2 96%   BMI 49.78 kg/m  BP Readings from Last 3 Encounters:  09/27/23 132/76  08/29/23 (!) 154/92  08/27/23 108/64   Wt Readings from Last 3 Encounters:  09/27/23 290 lb (131.5 kg)  08/29/23 283 lb 12.8 oz (128.7 kg)  08/27/23 284 lb (128.8 kg)      Physical Exam Constitutional:      General: She is  not in acute distress.    Appearance: Normal appearance.  HENT:     Head: Normocephalic and atraumatic.     Nose: Nose normal.     Mouth/Throat:     Mouth: Mucous membranes are moist.  Cardiovascular:     Rate and Rhythm: Normal rate and regular rhythm.     Heart sounds: Normal heart sounds. No murmur heard.    No gallop.  Pulmonary:     Effort: Pulmonary effort is normal. No respiratory distress.     Breath sounds: Normal breath sounds. No wheezing, rhonchi or rales.  Skin:    General: Skin is warm and dry.  Neurological:     Mental Status: She is alert and oriented to person, place, and time.        09/27/2023   10:34 AM 08/22/2023   11:34 AM 07/11/2023   10:33 AM  Depression screen PHQ 2/9  Decreased Interest 3 1 1   Down, Depressed, Hopeless 0 1 1  PHQ - 2 Score 3 2 2   Altered sleeping 3 0 1  Tired, decreased energy 3 2 3   Change in appetite 3 2 1   Feeling bad or failure about yourself  0 1 1  Trouble concentrating 3 2 1   Moving slowly or fidgety/restless 3 2 1   Suicidal thoughts 0 0 0  PHQ-9 Score 18 11 10   Difficult doing work/chores   Somewhat difficult      09/27/2023   10:35 AM 08/22/2023   11:35 AM 07/11/2023   10:33 AM 06/11/2023    2:25 PM  GAD 7 : Generalized Anxiety Score  Nervous, Anxious, on Edge 1 1 1 1   Control/stop worrying 3 1 1 1   Worry too much - different things 3 1 1 1   Trouble relaxing 3 1 1 1   Restless 1 0 0 1  Easily annoyed or irritable 0 1 1 1   Afraid - awful might happen 0 2 0 0  Total GAD 7 Score 11 7 5 6   Anxiety Difficulty Somewhat difficult  Somewhat difficult      No results found for any visits on 09/27/23.    Assessment & Plan:   Osteoarthritis of spine with radiculopathy, lumbar  region -     MR LUMBAR SPINE WO CONTRAST; Future  Pain of right thigh -     MR LUMBAR SPINE WO CONTRAST; Future  Essential hypertension  Type 2 diabetes mellitus with diabetic neuropathy, without long-term current use of insulin  (HCC) -      Microalbumin / creatinine urine ratio  Mild anemia  Chronic right-sided low back pain with bilateral sciatica  Given patient's continue leg pain concern for radiculopathy.  MRI lumbar spine ordered.  Continue supportive care.  Continue using tramadol  sparingly.  HTN controlled.  Continue current medications including spironolactone  25 mg daily, diltiazem  240 mg daily, clonidine  0.1 mg.  Lifestyle modifications encouraged.  DM 2 controlled.  A1c 6.5% on 07/28/2023.  Continue current medications.  Mild anemia noted in ED.  Hemoglobin 11.1 on 08/27/2023.  Continue to monitor.  Return if symptoms worsen or fail to improve.   Clotilda JONELLE Single, MD

## 2023-09-28 DIAGNOSIS — I5032 Chronic diastolic (congestive) heart failure: Secondary | ICD-10-CM | POA: Diagnosis not present

## 2023-09-28 DIAGNOSIS — D62 Acute posthemorrhagic anemia: Secondary | ICD-10-CM | POA: Diagnosis not present

## 2023-09-28 DIAGNOSIS — E1165 Type 2 diabetes mellitus with hyperglycemia: Secondary | ICD-10-CM | POA: Diagnosis not present

## 2023-09-28 DIAGNOSIS — I110 Hypertensive heart disease with heart failure: Secondary | ICD-10-CM | POA: Diagnosis not present

## 2023-10-01 DIAGNOSIS — I5032 Chronic diastolic (congestive) heart failure: Secondary | ICD-10-CM | POA: Diagnosis not present

## 2023-10-01 DIAGNOSIS — E1165 Type 2 diabetes mellitus with hyperglycemia: Secondary | ICD-10-CM | POA: Diagnosis not present

## 2023-10-01 DIAGNOSIS — I110 Hypertensive heart disease with heart failure: Secondary | ICD-10-CM | POA: Diagnosis not present

## 2023-10-01 DIAGNOSIS — D62 Acute posthemorrhagic anemia: Secondary | ICD-10-CM | POA: Diagnosis not present

## 2023-10-03 DIAGNOSIS — E1165 Type 2 diabetes mellitus with hyperglycemia: Secondary | ICD-10-CM | POA: Diagnosis not present

## 2023-10-03 DIAGNOSIS — I5032 Chronic diastolic (congestive) heart failure: Secondary | ICD-10-CM | POA: Diagnosis not present

## 2023-10-03 DIAGNOSIS — D62 Acute posthemorrhagic anemia: Secondary | ICD-10-CM | POA: Diagnosis not present

## 2023-10-03 DIAGNOSIS — I110 Hypertensive heart disease with heart failure: Secondary | ICD-10-CM | POA: Diagnosis not present

## 2023-10-05 DIAGNOSIS — D62 Acute posthemorrhagic anemia: Secondary | ICD-10-CM | POA: Diagnosis not present

## 2023-10-05 DIAGNOSIS — E1165 Type 2 diabetes mellitus with hyperglycemia: Secondary | ICD-10-CM | POA: Diagnosis not present

## 2023-10-05 DIAGNOSIS — I5032 Chronic diastolic (congestive) heart failure: Secondary | ICD-10-CM | POA: Diagnosis not present

## 2023-10-05 DIAGNOSIS — I110 Hypertensive heart disease with heart failure: Secondary | ICD-10-CM | POA: Diagnosis not present

## 2023-10-09 DIAGNOSIS — I5032 Chronic diastolic (congestive) heart failure: Secondary | ICD-10-CM | POA: Diagnosis not present

## 2023-10-09 DIAGNOSIS — D62 Acute posthemorrhagic anemia: Secondary | ICD-10-CM | POA: Diagnosis not present

## 2023-10-09 DIAGNOSIS — E1165 Type 2 diabetes mellitus with hyperglycemia: Secondary | ICD-10-CM | POA: Diagnosis not present

## 2023-10-09 DIAGNOSIS — I110 Hypertensive heart disease with heart failure: Secondary | ICD-10-CM | POA: Diagnosis not present

## 2023-10-10 DIAGNOSIS — D62 Acute posthemorrhagic anemia: Secondary | ICD-10-CM | POA: Diagnosis not present

## 2023-10-10 DIAGNOSIS — I110 Hypertensive heart disease with heart failure: Secondary | ICD-10-CM | POA: Diagnosis not present

## 2023-10-10 DIAGNOSIS — I5032 Chronic diastolic (congestive) heart failure: Secondary | ICD-10-CM | POA: Diagnosis not present

## 2023-10-10 DIAGNOSIS — E1165 Type 2 diabetes mellitus with hyperglycemia: Secondary | ICD-10-CM | POA: Diagnosis not present

## 2023-10-11 ENCOUNTER — Ambulatory Visit: Payer: Self-pay | Admitting: Family Medicine

## 2023-10-11 DIAGNOSIS — M5126 Other intervertebral disc displacement, lumbar region: Secondary | ICD-10-CM

## 2023-10-11 DIAGNOSIS — Z981 Arthrodesis status: Secondary | ICD-10-CM

## 2023-10-11 DIAGNOSIS — M48062 Spinal stenosis, lumbar region with neurogenic claudication: Secondary | ICD-10-CM

## 2023-10-11 DIAGNOSIS — M4807 Spinal stenosis, lumbosacral region: Secondary | ICD-10-CM

## 2023-10-12 ENCOUNTER — Ambulatory Visit
Admission: RE | Admit: 2023-10-12 | Discharge: 2023-10-12 | Disposition: A | Discharge status: Home / Self Care | Source: Ambulatory Visit | Attending: Family Medicine | Admitting: Family Medicine

## 2023-10-12 DIAGNOSIS — M79651 Pain in right thigh: Secondary | ICD-10-CM

## 2023-10-12 DIAGNOSIS — M4726 Other spondylosis with radiculopathy, lumbar region: Secondary | ICD-10-CM

## 2023-10-12 DIAGNOSIS — M48061 Spinal stenosis, lumbar region without neurogenic claudication: Secondary | ICD-10-CM | POA: Diagnosis not present

## 2023-10-14 ENCOUNTER — Other Ambulatory Visit: Payer: Self-pay | Admitting: Internal Medicine

## 2023-10-15 DIAGNOSIS — D62 Acute posthemorrhagic anemia: Secondary | ICD-10-CM | POA: Diagnosis not present

## 2023-10-15 DIAGNOSIS — E1165 Type 2 diabetes mellitus with hyperglycemia: Secondary | ICD-10-CM | POA: Diagnosis not present

## 2023-10-15 DIAGNOSIS — I5032 Chronic diastolic (congestive) heart failure: Secondary | ICD-10-CM | POA: Diagnosis not present

## 2023-10-15 DIAGNOSIS — I110 Hypertensive heart disease with heart failure: Secondary | ICD-10-CM | POA: Diagnosis not present

## 2023-10-17 DIAGNOSIS — E1165 Type 2 diabetes mellitus with hyperglycemia: Secondary | ICD-10-CM | POA: Diagnosis not present

## 2023-10-17 DIAGNOSIS — D62 Acute posthemorrhagic anemia: Secondary | ICD-10-CM | POA: Diagnosis not present

## 2023-10-17 DIAGNOSIS — I5032 Chronic diastolic (congestive) heart failure: Secondary | ICD-10-CM | POA: Diagnosis not present

## 2023-10-17 DIAGNOSIS — I110 Hypertensive heart disease with heart failure: Secondary | ICD-10-CM | POA: Diagnosis not present

## 2023-10-23 DIAGNOSIS — M48062 Spinal stenosis, lumbar region with neurogenic claudication: Secondary | ICD-10-CM | POA: Insufficient documentation

## 2023-10-23 DIAGNOSIS — Z981 Arthrodesis status: Secondary | ICD-10-CM | POA: Insufficient documentation

## 2023-10-23 DIAGNOSIS — M5126 Other intervertebral disc displacement, lumbar region: Secondary | ICD-10-CM | POA: Insufficient documentation

## 2023-10-23 DIAGNOSIS — E1165 Type 2 diabetes mellitus with hyperglycemia: Secondary | ICD-10-CM | POA: Diagnosis not present

## 2023-10-23 DIAGNOSIS — D62 Acute posthemorrhagic anemia: Secondary | ICD-10-CM | POA: Diagnosis not present

## 2023-10-23 DIAGNOSIS — I5032 Chronic diastolic (congestive) heart failure: Secondary | ICD-10-CM | POA: Diagnosis not present

## 2023-10-23 DIAGNOSIS — M4807 Spinal stenosis, lumbosacral region: Secondary | ICD-10-CM | POA: Insufficient documentation

## 2023-10-23 DIAGNOSIS — I110 Hypertensive heart disease with heart failure: Secondary | ICD-10-CM | POA: Diagnosis not present

## 2023-10-23 NOTE — Addendum Note (Signed)
 Addended by: BRIEN SONG A on: 10/23/2023 02:30 PM   Modules accepted: Orders

## 2023-10-24 ENCOUNTER — Inpatient Hospital Stay

## 2023-10-26 ENCOUNTER — Other Ambulatory Visit: Attending: Oncology

## 2023-10-26 ENCOUNTER — Telehealth: Payer: Self-pay | Admitting: Oncology

## 2023-10-29 ENCOUNTER — Telehealth: Payer: Self-pay | Admitting: Family Medicine

## 2023-10-29 NOTE — Telephone Encounter (Signed)
 Copied from CRM [id number]. Topic: Referral - Request for Referral >> Oct 29, 2023 11:22 AM Suzen RAMAN wrote: Did the patient discuss referral with their provider in the last year? Yes (If No - schedule appointment) (If Yes - send message)  Appointment offered? No  Type of order/referral and detailed reason for visit: Neurology  Preference of office, provider, location: [facility] Colleen LITTIE Peru MD [address] [place], [place], [place] [address]  97 mi [phone]  If referral order, have you been seen by this specialty before? Yes (If Yes, this issue or another issue? When? Where?  Can we respond through MyChart? Yes

## 2023-10-30 ENCOUNTER — Other Ambulatory Visit: Payer: Self-pay

## 2023-10-30 ENCOUNTER — Other Ambulatory Visit: Payer: Self-pay | Admitting: Family Medicine

## 2023-10-30 DIAGNOSIS — E1165 Type 2 diabetes mellitus with hyperglycemia: Secondary | ICD-10-CM | POA: Diagnosis not present

## 2023-10-30 DIAGNOSIS — I5032 Chronic diastolic (congestive) heart failure: Secondary | ICD-10-CM | POA: Diagnosis not present

## 2023-10-30 DIAGNOSIS — D62 Acute posthemorrhagic anemia: Secondary | ICD-10-CM | POA: Diagnosis not present

## 2023-10-30 DIAGNOSIS — I110 Hypertensive heart disease with heart failure: Secondary | ICD-10-CM | POA: Diagnosis not present

## 2023-10-30 DIAGNOSIS — E1140 Type 2 diabetes mellitus with diabetic neuropathy, unspecified: Secondary | ICD-10-CM

## 2023-10-30 MED ORDER — FUROSEMIDE 80 MG PO TABS: 40.0000 mg | ORAL_TABLET | Freq: Every day | ORAL | 0 refills | Status: DC

## 2023-10-30 MED ORDER — LANCETS MISC. MISC: 3 refills | Status: DC

## 2023-10-30 NOTE — Telephone Encounter (Signed)
 Copied from CRM [id number]. Topic: Clinical - Medication Refill >> Oct 30, 2023 10:24 AM Franky GRADE wrote: Medication: Lancets Misc. MISC, furosemide  (LASIX ) 80 MG tablet [[id number]]  Has the patient contacted their pharmacy? Yes, pharmacy asked patient to contact the provider. (Agent: If no, request that the patient contact the pharmacy for the refill. If patient does not wish to contact the pharmacy document the reason why and proceed with request.) (Agent: If yes, when and what did the pharmacy advise?)  This is the patient's preferred pharmacy:  [facility], [place] - [address] [place] [address] Phone: [phone] Fax: [phone]   Is this the correct pharmacy for this prescription? Yes If no, delete pharmacy and type the correct one.   Has the prescription been filled recently? No  Is the patient out of the medication? Yes  Has the patient been seen for an appointment in the last year OR does the patient have an upcoming appointment? Yes  Can we respond through MyChart? Yes  Agent: Please be advised that Rx refills may take up to 3 business days. We ask that you follow-up with your pharmacy.

## 2023-10-30 NOTE — Telephone Encounter (Signed)
 Pt of Dr. Santo. This RX was changed from 2 times daily to half a tab daily. Please advise on a refill.

## 2023-11-01 DIAGNOSIS — E1165 Type 2 diabetes mellitus with hyperglycemia: Secondary | ICD-10-CM | POA: Diagnosis not present

## 2023-11-01 DIAGNOSIS — D62 Acute posthemorrhagic anemia: Secondary | ICD-10-CM | POA: Diagnosis not present

## 2023-11-01 DIAGNOSIS — I110 Hypertensive heart disease with heart failure: Secondary | ICD-10-CM | POA: Diagnosis not present

## 2023-11-01 DIAGNOSIS — I5032 Chronic diastolic (congestive) heart failure: Secondary | ICD-10-CM | POA: Diagnosis not present

## 2023-11-06 DIAGNOSIS — M4726 Other spondylosis with radiculopathy, lumbar region: Secondary | ICD-10-CM | POA: Diagnosis not present

## 2023-11-07 DIAGNOSIS — I5032 Chronic diastolic (congestive) heart failure: Secondary | ICD-10-CM | POA: Diagnosis not present

## 2023-11-07 DIAGNOSIS — E1165 Type 2 diabetes mellitus with hyperglycemia: Secondary | ICD-10-CM | POA: Diagnosis not present

## 2023-11-07 DIAGNOSIS — I110 Hypertensive heart disease with heart failure: Secondary | ICD-10-CM | POA: Diagnosis not present

## 2023-11-07 DIAGNOSIS — D62 Acute posthemorrhagic anemia: Secondary | ICD-10-CM | POA: Diagnosis not present

## 2023-11-08 ENCOUNTER — Inpatient Hospital Stay: Attending: Oncology

## 2023-11-08 ENCOUNTER — Ambulatory Visit: Payer: Self-pay | Admitting: Oncology

## 2023-11-08 ENCOUNTER — Other Ambulatory Visit: Payer: Self-pay

## 2023-11-08 ENCOUNTER — Other Ambulatory Visit: Payer: Self-pay | Admitting: Internal Medicine

## 2023-11-08 ENCOUNTER — Other Ambulatory Visit: Payer: Self-pay | Admitting: Family Medicine

## 2023-11-08 DIAGNOSIS — Z7901 Long term (current) use of anticoagulants: Secondary | ICD-10-CM

## 2023-11-08 DIAGNOSIS — Z86718 Personal history of other venous thrombosis and embolism: Secondary | ICD-10-CM | POA: Insufficient documentation

## 2023-11-08 DIAGNOSIS — D6862 Lupus anticoagulant syndrome: Secondary | ICD-10-CM | POA: Insufficient documentation

## 2023-11-08 DIAGNOSIS — K219 Gastro-esophageal reflux disease without esophagitis: Secondary | ICD-10-CM

## 2023-11-08 DIAGNOSIS — E1140 Type 2 diabetes mellitus with diabetic neuropathy, unspecified: Secondary | ICD-10-CM

## 2023-11-08 LAB — PROTIME-INR
INR: 2 — ABNORMAL HIGH (ref 0.8–1.2)
Prothrombin Time: 23.3 s — ABNORMAL HIGH (ref 11.4–15.2)

## 2023-11-08 NOTE — Telephone Encounter (Signed)
-----   Message from Arley Hof sent at 11/08/2023  2:03 PM EDT ----- Please call patient, continue Coumadin  same dose, repeat PT/INR in 1 month  ----- Message ----- From: Rebecka, Lab In [place] Sent: 11/08/2023  11:05 AM EDT To: Arley KATHEE Hof, MD

## 2023-11-08 NOTE — Telephone Encounter (Unsigned)
 Copied from CRM #[id number]. Topic: Clinical - Medication Refill >> Nov 08, 2023  2:02 PM Berneda FALCON wrote: Medication:  LINZESS  145 MCG CAPS capsule   Has the patient contacted their pharmacy? Yes (Agent: If no, request that the patient contact the pharmacy for the refill. If patient does not wish to contact the pharmacy document the reason why and proceed with request.) (Agent: If yes, when and what did the pharmacy advise?)  This is the patient's preferred pharmacy:  [facility], [place] - [address] [place] [address] Phone: [phone] Fax: [phone]  Is this the correct pharmacy for this prescription? Yes If no, delete pharmacy and type the correct one.   Has the prescription been filled recently? No  Is the patient out of the medication? Yes, has been out for 5 days  Has the patient been seen for an appointment in the last year OR does the patient have an upcoming appointment? Yes  Can we respond through MyChart? Yes  Agent: Please be advised that Rx refills may take up to 3 business days. We ask that you follow-up with your pharmacy.

## 2023-11-08 NOTE — Telephone Encounter (Signed)
 Patient gave verbal understanding and had no further questions or concerns

## 2023-11-09 DIAGNOSIS — E1165 Type 2 diabetes mellitus with hyperglycemia: Secondary | ICD-10-CM | POA: Diagnosis not present

## 2023-11-09 DIAGNOSIS — I5032 Chronic diastolic (congestive) heart failure: Secondary | ICD-10-CM | POA: Diagnosis not present

## 2023-11-09 DIAGNOSIS — I110 Hypertensive heart disease with heart failure: Secondary | ICD-10-CM | POA: Diagnosis not present

## 2023-11-09 MED ORDER — LINZESS 145 MCG PO CAPS: 145.0000 ug | ORAL_CAPSULE | ORAL | 6 refills | Status: AC

## 2023-11-13 ENCOUNTER — Other Ambulatory Visit (HOSPITAL_COMMUNITY): Payer: Self-pay | Admitting: Neurosurgery

## 2023-11-13 DIAGNOSIS — M4726 Other spondylosis with radiculopathy, lumbar region: Secondary | ICD-10-CM

## 2023-11-14 ENCOUNTER — Telehealth: Payer: Self-pay

## 2023-11-14 NOTE — Telephone Encounter (Signed)
 The patient contacted the office to inform us  that she will be discontinuing her Coumadin  on September 30th in preparation for her myelogram scheduled for September 5th. I advised the patient that I would relay this information to the provider.

## 2023-11-15 DIAGNOSIS — I110 Hypertensive heart disease with heart failure: Secondary | ICD-10-CM | POA: Diagnosis not present

## 2023-11-21 ENCOUNTER — Ambulatory Visit (INDEPENDENT_AMBULATORY_CARE_PROVIDER_SITE_OTHER): Admitting: Pulmonary Disease

## 2023-11-21 ENCOUNTER — Encounter: Payer: Self-pay | Admitting: Pulmonary Disease

## 2023-11-21 VITALS — BP 140/82 | HR 85 | Temp 98.1°F | Ht 66.0 in | Wt 283.0 lb

## 2023-11-21 DIAGNOSIS — J301 Allergic rhinitis due to pollen: Secondary | ICD-10-CM | POA: Diagnosis not present

## 2023-11-21 DIAGNOSIS — D62 Acute posthemorrhagic anemia: Secondary | ICD-10-CM | POA: Diagnosis not present

## 2023-11-21 DIAGNOSIS — J449 Chronic obstructive pulmonary disease, unspecified: Secondary | ICD-10-CM

## 2023-11-21 DIAGNOSIS — G4733 Obstructive sleep apnea (adult) (pediatric): Secondary | ICD-10-CM

## 2023-11-21 DIAGNOSIS — I5032 Chronic diastolic (congestive) heart failure: Secondary | ICD-10-CM | POA: Diagnosis not present

## 2023-11-21 DIAGNOSIS — I110 Hypertensive heart disease with heart failure: Secondary | ICD-10-CM | POA: Diagnosis not present

## 2023-11-21 DIAGNOSIS — J4540 Moderate persistent asthma, uncomplicated: Secondary | ICD-10-CM | POA: Diagnosis not present

## 2023-11-21 DIAGNOSIS — E1165 Type 2 diabetes mellitus with hyperglycemia: Secondary | ICD-10-CM | POA: Diagnosis not present

## 2023-11-21 MED ORDER — ALBUTEROL SULFATE 0.63 MG/3ML IN NEBU: 1.0000 | INHALATION_SOLUTION | Freq: Four times a day (QID) | RESPIRATORY_TRACT | 12 refills | Status: AC | PRN

## 2023-11-21 NOTE — Addendum Note (Signed)
 Addended by: MOODY RANCHER on: 11/21/2023 12:10 PM   Modules accepted: Orders

## 2023-11-21 NOTE — Progress Notes (Signed)
 [name]    [id number]    Sep 17, 1955  Primary Care Physician:Banks, Clotilda SAUNDERS, MD  Referring Physician: Mercer Clotilda SAUNDERS, MD [address] [place],  [place] [address]  Chief complaint: Follow up for dyspnea, COPD, asthma, post COVID-19, DVT  HPI: 68 y.o. with history of clotting disorder secondary to antiphospholipid syndrome and recurrent DVT on Coumadin , lupus, OSA on CPAP, right adrenal mass, diabetes, Asthma  Follows with [place] [facility] at [facility]. She was hospitalized in December 2020 for COVID-19 infection, presumed COPD exacerbation treated with remdesivir . Chief complaint is chronic dyspnea on exertion.  Her symptoms have worsened after her hospitalization for COVID-19. Also has lower extremity edema, orthopnea. Diagnosed with sleep apnea at [facility] in [place] in 2022.  On CPAP managed by [facility] physicians   Interim history: Discussed the use of AI scribe software for clinical note transcription with the patient, who gave verbal consent to proceed.  History of Present Illness [name] is a 68 year old female with chronic respiratory issues who presents for a follow-up regarding her breathing management.  Dyspnea and airway management - Chronic respiratory symptoms with slight improvement overall - Significant improvement in breathing with nebulizer use - No nebulizer use in the past month due to lapse in prescription; seeking refill - Uses Symbicort  inhaler occasionally for severe dyspnea, especially when away from home - Carries Symbicort  inhaler at all times  Sleep-disordered breathing - Obstructive sleep apnea managed with CPAP therapy - Recently resumed CPAP use after resolving apartment air conditioning issues - Under care of sleep specialist at [facility] for CPAP management  Anticoagulation management and recent hospitalizations - Currently off Coumadin  this week in preparation for  myelogram scheduled Friday - Hospitalized earlier this year for elevated INR - Experienced syncopal episode shortly after hospital discharge; Echo, CT head and MRI showed no acute abnormalities - CT angiogram and lower extremity ultrasound negative for blood clots  Back pain with radiculopathy - Back pain radiating down both legs - Scheduled for myelogram on Friday for further evaluation  Functional status and healthcare access - Hospitalized twice this year - Awaiting clearance from physical therapy - Relies on transportation assistance for medical appointments   Relevant pulmonary history Pets: No pets Occupation: Used to work in Scientist, water quality, [employer], [employer], Chief Operating Officer.  Currently on disability Exposures: No known exposures.  No mold, hot tub, Jacuzzi Smoking history: Has not smoked cigarettes.  Used to smoke cocaine from 2003-2006 Travel history: Previously lived in [place]  for 10 years in [place] for a year.  No significant recent travel Relevant family history: Strong family history of asthma with multiple family members.  Outpatient Encounter Medications as of 11/21/2023  Medication Sig   ACCU-CHEK GUIDE TEST test strip USE TO TEST BLOOD SUGAR UP TO FOUR TIMES A DAY AS DIRECTED   ACCU-CHEK GUIDE TEST test strip USE TO TEST BLOOD SUGAR IN THE MORNING, AT NOON, AND AT BEDTIME   Accu-Chek Softclix Lancets lancets USE TO TEST BLOOD SUGAR UP TO FOUR TIMES A DAY AS DIRECTED   albuterol  (VENTOLIN  HFA) 108 (90 Base) MCG/ACT inhaler INHALE TWO (2) PUFFS BY MOUTH EVERY 4 HOURS AS NEEDED FOR SHORTNESS OF BREATH   ALLERGY RELIEF 180 MG tablet Take 180 mg by mouth daily.   Blood Glucose Monitoring Suppl (ACCU-CHEK GUIDE) w/Device KIT USE TO TEST BLOOD SUGAR 3 TIMES DAILY   canagliflozin  (INVOKANA ) 100 MG TABS tablet TAKE 1  TABLET (100 MG TOTAL) BY MOUTH DAILY BEFORE BREAKFAST.   cloNIDine  (CATAPRES ) 0.1 MG tablet Take 0.1 mg by mouth 2 (two) times daily.   diclofenac  sodium  (VOLTAREN ) 1 % GEL Apply 2 g topically daily as needed (fibromyalgia pain- affected sites).   diltiazem  (CARDIZEM  CD) 240 MG 24 hr capsule TAKE 1 CAPSULE BY MOUTH DAILY BEFORE BREAKFAST   DULoxetine  (CYMBALTA ) 30 MG capsule Take 90 mg by mouth at bedtime.   FEROSUL 325 (65 Fe) MG tablet TAKE ONE TABLET BY MOUTH ONCE DAILY   fluticasone  (FLONASE ) 50 MCG/ACT nasal spray Place 2 sprays into both nostrils 2 (two) times daily.   furosemide  (LASIX ) 80 MG tablet Take 0.5 tablets (40 mg total) by mouth daily. Please schedule overdue Cardiology office visit for future refills.   gabapentin  (NEURONTIN ) 300 MG capsule TAKE ONE (1) CAPSULE BY MOUTH TWICE DAILY   Incontinence Supply Disposable (PROCARE BARIATRIC BRIEFS) MISC Use as needed.   Lancets Misc. MISC May substitute to any manufacturer covered by patient's insurance.   LINZESS  145 MCG CAPS capsule Take 1 capsule (145 mcg total) by mouth every other day.   metFORMIN  (GLUCOPHAGE ) 500 MG tablet TAKE 1 TABLET BY MOUTH EVERY DAY AT BEDTIME   omeprazole  (PRILOSEC) 20 MG capsule TAKE 1 CAPSULE BY MOUTH TWICE DAILY BEFORE A MEAL   OZEMPIC , 2 MG/DOSE, 8 MG/3ML SOPN INJECT 2MG  SUBCUTANEOUSLY ONCE WEEKLY   potassium chloride  (MICRO-K ) 10 MEQ CR capsule Take 20 mEq by mouth at bedtime.   QUEtiapine  (SEROQUEL ) 100 MG tablet Take 150 mg by mouth at bedtime.   rosuvastatin  (CRESTOR ) 10 MG tablet Take 1 tablet (10 mg total) by mouth daily.   spironolactone  (ALDACTONE ) 25 MG tablet TAKE 1/2 TABLET BY MOUTH ONCE DAILY; *PATIENT NEEDS APPOINTMENT FOR ADDITIONAL REFILLS*   SYMBICORT  160-4.5 MCG/ACT inhaler INHALE TWO (2) PUFFS BY MOUTH TWICE DAILY   tamsulosin  (FLOMAX ) 0.4 MG CAPS capsule Take 1 capsule (0.4 mg total) by mouth daily.   traMADol  (ULTRAM ) 50 MG tablet Take 1 tablet (50 mg total) by mouth every 12 (twelve) hours as needed.   warfarin (COUMADIN ) 10 MG tablet Take by mouth daily.   warfarin (COUMADIN ) 7.5 MG tablet TAKE 1 TABLET BY MOUTH DAILY   zonisamide   (ZONEGRAN ) 100 MG capsule TAKE 1 CAPSULE BY MOUTH EACH MORNING AND TAKE 2 CAPSULES IN THE EVENING   lidocaine  (LIDODERM ) 5 % APPLY 1 PATCH TOPICALLY ON THE SKIN DAILY AS NEEDED TO PAINFUL SITE *12 HOURS ON AND 12 HOURS OFF OR AS DIRECTED BY MD*   Facility-Administered Encounter Medications as of 11/21/2023  Medication   sodium chloride  flush (NS) 0.9 % injection 3 mL   Vitals:   11/21/23 1008  BP: (!) 140/82  Pulse: 85  Temp: 98.1 F (36.7 C)  Height: 5' 6 (1.676 m)  Weight: 283 lb (128.4 kg)  SpO2: 96%  TempSrc: Oral  BMI (Calculated): 45.7     Physical Exam GEN: No acute distress CV: Regular rate and rhythm no murmurs LUNGS: Clear to auscultation bilaterally normal respiratory effort SKIN JOINTS: Warm and dry no rash    Data Reviewed: Imaging: Chest x-ray 12/19/2018-no acute abnormalities. Chest x-ray 03/09/2019-cardiomegaly, bilateral interstitial opacities consistent with pulmonary edema. Chest x-ray 07/09/2019-no acute findings.  CT coronaries 05/07/2020-visualized lung show no infiltrate with clear lungs CTA 07/27/2023-no pulmonary embolism.  Visualized lungs appear clear.  PFTs: 08/12/2019 FVC 1.81 [64%], FEV1 1.68 [77%], F/F 93, TLC 3.21 [60%], DLCO 22.89 [107%] Moderate restriction, bronchodilator response  Labs: CBC  07/09/2019-WBC 6.8, eos 2%, absolute eosinophil count 136. IgE 07/09/2019-12 BNP 07/09/19- 18 Alpha-1 antitrypsin 07/09/2019-126, PI MM  Cardiac: Echocardiogram  02/20/2019-LVEF 65-70%, grade 1 diastolic dysfunction 07/28/2023-LVEF 60 to 65%, normal RV systolic size and function, trivial tricuspid regurgitation  Assessment & Plan Moderate persistent asthma Dyspnea is likely multifactorial from asthma, diastolic heart failure.   PFTs reviewed with bronchodilator response.  There is restriction which is likely from body habitus.  Breathing improves significantly with nebulizer use, reducing reliance on Symbicort . Nebulizer medication unavailable for a  month due to prescription lapse. - Send a refill for the albuterol  nebulizer to [facility] - Continue Symbicort  as needed  Obstructive sleep apnea Resumed CPAP usage after resolving air conditioning issues. Managed by a sleep doctor at [facility] - Continue CPAP usage  Antiphospholipid syndrome secondary to lupus with history of venous thromboembolism Currently off Coumadin  due to upcoming myelogram for back pain. High INR levels previously led to hospitalization. Recent imaging showed no blood clots. Experienced a syncopal episode, possibly related to medication levels, but imaging was unremarkable. - Resume Coumadin  after the lumbar myelogram  Plan/Recommendations: - Symbicort  2 puffs twice daily - Albuterol  nebulizer  Lonna Coder MD [facility] 11/21/2023, 10:14 AM  CC: Mercer Clotilda SAUNDERS, MD

## 2023-11-21 NOTE — Patient Instructions (Signed)
  VISIT SUMMARY: Today, you came in for a follow-up visit to discuss your breathing management and other health concerns. We reviewed your chronic respiratory issues, sleep apnea, anticoagulation management, and back pain. We also discussed your recent hospitalizations and your current functional status.  YOUR PLAN: -MODERATE PERSISTENT ASTHMA AND CHRONIC OBSTRUCTIVE PULMONARY DISEASE: You have chronic respiratory conditions that cause difficulty in breathing. Your breathing improves significantly with the use of a nebulizer, which you have not used in the past month due to a lapse in your prescription. We will send a refill for your albuterol  nebulizer to [facility]. Continue using your Symbicort  inhaler as needed.  -OBSTRUCTIVE SLEEP APNEA: You have a condition where your breathing stops and starts during sleep. You have resumed using your CPAP machine after resolving air conditioning issues in your apartment. Continue using your CPAP machine as directed by your sleep specialist at [facility].  -ANTIPHOSPHOLIPID SYNDROME SECONDARY TO LUPUS WITH HISTORY OF VENOUS THROMBOEMBOLISM: You have a condition that increases your risk of blood clots. You are currently off Coumadin  in preparation for your upcoming myelogram. You have had high INR levels in the past, which led to hospitalization. Recent imaging showed no blood clots. Resume taking Coumadin  after your myelogram on Friday.  INSTRUCTIONS: Please follow up with your sleep specialist at [facility] for CPAP management. Resume taking Coumadin  after your myelogram on Friday. Ensure you have transportation assistance for your medical appointments.

## 2023-11-23 ENCOUNTER — Ambulatory Visit (HOSPITAL_COMMUNITY)
Admission: RE | Admit: 2023-11-23 | Discharge: 2023-11-23 | Disposition: A | Discharge status: Home / Self Care | Source: Ambulatory Visit | Attending: Neurosurgery | Admitting: Neurosurgery

## 2023-11-23 ENCOUNTER — Other Ambulatory Visit: Payer: Self-pay

## 2023-11-23 DIAGNOSIS — M48061 Spinal stenosis, lumbar region without neurogenic claudication: Secondary | ICD-10-CM | POA: Diagnosis not present

## 2023-11-23 DIAGNOSIS — M4726 Other spondylosis with radiculopathy, lumbar region: Secondary | ICD-10-CM | POA: Insufficient documentation

## 2023-11-23 DIAGNOSIS — M79604 Pain in right leg: Secondary | ICD-10-CM | POA: Diagnosis not present

## 2023-11-23 DIAGNOSIS — M5416 Radiculopathy, lumbar region: Secondary | ICD-10-CM | POA: Diagnosis not present

## 2023-11-23 DIAGNOSIS — M5116 Intervertebral disc disorders with radiculopathy, lumbar region: Secondary | ICD-10-CM | POA: Diagnosis not present

## 2023-11-23 LAB — GLUCOSE, CAPILLARY: Glucose-Capillary: 114 mg/dL — ABNORMAL HIGH (ref 70–99)

## 2023-11-23 MED ORDER — DIAZEPAM 5 MG PO TABS
ORAL_TABLET | ORAL | Status: AC
  Filled 2023-11-23: qty 1

## 2023-11-23 MED ORDER — LIDOCAINE HCL (PF) 1 % IJ SOLN
5.0000 mL | Freq: Once | INTRAMUSCULAR | Status: AC
Start: 2023-11-23 — End: 2023-11-23
  Administered 2023-11-23: 5 mL via INTRADERMAL

## 2023-11-23 MED ORDER — IOHEXOL 180 MG/ML  SOLN
20.0000 mL | Freq: Once | INTRAMUSCULAR | Status: AC | PRN
  Administered 2023-11-23: 20 mL via INTRATHECAL

## 2023-11-23 MED ORDER — OXYCODONE HCL 5 MG PO TABS
5.0000 mg | ORAL_TABLET | ORAL | Status: DC | PRN
  Administered 2023-11-23: 5 mg via ORAL
  Filled 2023-11-23: qty 1

## 2023-11-23 MED ORDER — ONDANSETRON HCL 4 MG/2ML IJ SOLN: 4.0000 mg | Freq: Four times a day (QID) | INTRAMUSCULAR | Status: DC | PRN

## 2023-11-23 MED ORDER — DIAZEPAM 5 MG PO TABS
5.0000 mg | ORAL_TABLET | Freq: Once | ORAL | Status: AC
  Administered 2023-11-23: 5 mg via ORAL

## 2023-11-23 NOTE — Discharge Instructions (Signed)
Myelogram and Lumbar Puncture Discharge Instructions  Go home and rest quietly for the next 24 hours.  It is important to lie flat for the next 24 hours.  Get up only to go to the restroom.  You may lie in the bed or on a couch on your back, your stomach, your left side or your right side.  You may have one pillow under your head.  You may have pillows between your knees while you are on your side or under your knees while you are on your back.  DO NOT drive today.  Recline the seat as far back as it will go, while still wearing your seat belt, on the way home.  You may get up to go to the bathroom as needed.  You may sit up for 10 minutes to eat.  You may resume your normal diet and medications unless otherwise indicated.  The incidence of headache, nausea, or vomiting is about 5% (one in 20 patients).  If you develop a headache, lie flat and drink plenty of fluids until the headache goes away.  Caffeinated beverages may be helpful.  If you develop severe nausea and vomiting or a headache that does not go away with flat bed rest, call [phone].  You may resume normal activities after your 24 hours of bed rest is over; however, do not exert yourself strongly or do any heavy lifting tomorrow.  Call your physician for a follow-up appointment.  The results of your myelogram will be sent directly to your physician by the following day.  Discharge instructions have been explained to the patient.  The patient, or the person responsible for the patient, fully understands these instructions.   

## 2023-11-23 NOTE — Op Note (Signed)
*   No surgery found * lumbar Myelogram  PATIENT:  [name] is a 68 y.o. female with right lower extremity pain  PRE-OPERATIVE DIAGNOSIS:  radiculopathy, right  POST-OPERATIVE DIAGNOSIS:  same  PROCEDURE:  Lumbar Myelogram  SURGEON:  Marquite Attwood  ANESTHESIA:   local LOCAL MEDICATIONS USED:  LIDOCAINE   Procedure Note: [name] is a 68 y.o. female Was taken to the fluoroscopy suite and  positioned prone on the fluoroscopy table. Her back was prepared and draped in a sterile manner. I infiltrated 5 cc into the lumbar region. I then introduced a spinal needle into the thecal sac at the L3/4 interlaminar space. I infiltrated 20cc of Isovue  180 into the thecal sac. Fluoroscopy showed the needle and contrast in the thecal sac. Ronal [name] Bocchino tolerated the procedure well. she Will be taken to CT for evaluation.     PATIENT DISPOSITION:  Short Stay

## 2023-11-29 DIAGNOSIS — M4316 Spondylolisthesis, lumbar region: Secondary | ICD-10-CM | POA: Diagnosis not present

## 2023-11-29 DIAGNOSIS — M51369 Other intervertebral disc degeneration, lumbar region without mention of lumbar back pain or lower extremity pain: Secondary | ICD-10-CM | POA: Diagnosis not present

## 2023-11-29 DIAGNOSIS — M48062 Spinal stenosis, lumbar region with neurogenic claudication: Secondary | ICD-10-CM | POA: Diagnosis not present

## 2023-12-07 ENCOUNTER — Inpatient Hospital Stay

## 2023-12-07 ENCOUNTER — Telehealth: Payer: Self-pay | Admitting: Oncology

## 2023-12-10 ENCOUNTER — Other Ambulatory Visit: Payer: Self-pay | Admitting: Neurosurgery

## 2023-12-10 ENCOUNTER — Other Ambulatory Visit: Payer: Self-pay | Admitting: Internal Medicine

## 2023-12-10 DIAGNOSIS — J449 Chronic obstructive pulmonary disease, unspecified: Secondary | ICD-10-CM | POA: Diagnosis not present

## 2023-12-14 NOTE — Progress Notes (Signed)
 Surgical Instructions   Your procedure is scheduled on Friday, October 3rd. Report to [facility] Main Entrance A at 9:30 A.M., then check in with the Admitting office. Any questions or running late day of surgery: call [phone]  Questions prior to your surgery date: call [phone], Monday-Friday, 8am-4pm. If you experience any cold or flu symptoms such as cough, fever, chills, shortness of breath, etc. between now and your scheduled surgery, please notify us  at the above number.     Remember:  Do not eat or drink after midnight the night before your surgery   Take these medicines the morning of surgery with A SIP OF WATER   cloNIDine  (CATAPRES )  diltiazem  (CARDIZEM  CD)  fluticasone  (FLONASE )  gabapentin  (NEURONTIN )  rosuvastatin  (CRESTOR )  SYMBICORT  inhaler  tamsulosin  (FLOMAX )    May take these medicines IF NEEDED: albuterol  (ACCUNEB ) - inhaler & nebulizer  omeprazole  (PRILOSEC)  traMADol  (ULTRAM )    Follow your surgeon's instructions on when to stop warfarin (COUMADIN ).  If no instructions were given by your surgeon then you will need to call the office to get those instructions.     WHAT DO I DO ABOUT MY DIABETES MEDICATION?   Do not take oral diabetes medicines metFORMIN  (GLUCOPHAGE )  the morning of surgery.  HOLD your INVOKANA  medication for 72 hours prior to your surgery.  Last dose on Monday, September 29th      As of today, HOLD your OZEMPIC  medication.   HOW TO MANAGE YOUR DIABETES BEFORE AND AFTER SURGERY  Why is it important to control my blood sugar before and after surgery? Improving blood sugar levels before and after surgery helps healing and can limit problems. A way of improving blood sugar control is eating a healthy diet by:  Eating less sugar and carbohydrates  Increasing activity/exercise  Talking with your doctor about reaching your blood sugar goals High blood sugars (greater than 180 mg/dL) can raise your risk of infections and slow  your recovery, so you will need to focus on controlling your diabetes during the weeks before surgery. Make sure that the doctor who takes care of your diabetes knows about your planned surgery including the date and location.  How do I manage my blood sugar before surgery? Check your blood sugar at least 4 times a day, starting 2 days before surgery, to make sure that the level is not too high or low.  Check your blood sugar the morning of your surgery when you wake up and every 2 hours until you get to the Short Stay unit.  If your blood sugar is less than 70 mg/dL, you will need to treat for low blood sugar: Do not take insulin . Treat a low blood sugar (less than 70 mg/dL) with  cup of clear juice (cranberry or apple), 4 glucose tablets, OR glucose gel. Recheck blood sugar in 15 minutes after treatment (to make sure it is greater than 70 mg/dL). If your blood sugar is not greater than 70 mg/dL on recheck, call [phone] for further instructions. Report your blood sugar to the short stay nurse when you get to Short Stay.  If you are admitted to the hospital after surgery: Your blood sugar will be checked by the staff and you will probably be given insulin  after surgery (instead of oral diabetes medicines) to make sure you have good blood sugar levels. The [place] for blood sugar control after surgery is 80-180 mg/dL.    One week prior to surgery, STOP taking any Aspirin  (unless otherwise instructed  by your surgeon) Aleve, Naproxen, Ibuprofen , Motrin , Advil , Goody's, BC's, all herbal medications, fish oil, and non-prescription vitamins. This includes your diclofenac  sodium (VOLTAREN ) gel.                       Do NOT Smoke (Tobacco/Vaping) for 24 hours prior to your procedure.  If you use a CPAP at night, you may bring your mask/headgear for your overnight stay.   You will be asked to remove any contacts, glasses, piercing's, hearing aid's, dentures/partials prior to surgery. Please  bring cases for these items if needed.    Patients discharged the day of surgery will not be allowed to drive home, and someone needs to stay with them for 24 hours.  SURGICAL WAITING ROOM VISITATION Patients may have no more than 2 support people in the waiting area - these visitors may rotate.   Pre-op nurse will coordinate an appropriate time for 1 ADULT support person, who may not rotate, to accompany patient in pre-op.  Children under the age of 45 must have an adult with them who is not the patient and must remain in the main waiting area with an adult.  If the patient needs to stay at the hospital during part of their recovery, the visitor guidelines for inpatient rooms apply.  Please refer to the [name] website for the visitor guidelines for any additional information.   If you received a COVID test during your pre-op visit  it is requested that you wear a mask when out in public, stay away from anyone that may not be feeling well and notify your surgeon if you develop symptoms. If you have been in contact with anyone that has tested positive in the last 10 days please notify you surgeon.      Pre-operative 5 CHG Bathing Instructions   You can play a key role in reducing the risk of infection after surgery. Your skin needs to be as free of germs as possible. You can reduce the number of germs on your skin by washing with CHG (chlorhexidine  gluconate) soap before surgery. CHG is an antiseptic soap that kills germs and continues to kill germs even after washing.   DO NOT use if you have an allergy to chlorhexidine /CHG or antibacterial soaps. If your skin becomes reddened or irritated, stop using the CHG and notify one of our RNs at [phone].   Please shower with the CHG soap starting 4 days before surgery using the following schedule:     Please keep in mind the following:  DO NOT shave, including legs and underarms, starting the day of your first shower.   You may shave  your face at any point before/day of surgery.  Place clean sheets on your bed the day you start using CHG soap. Use a clean washcloth (not used since being washed) for each shower. DO NOT sleep with pets once you start using the CHG.   CHG Shower Instructions:  Wash your face and private area with normal soap. If you choose to wash your hair, wash first with your normal shampoo.  After you use shampoo/soap, rinse your hair and body thoroughly to remove shampoo/soap residue.  Turn the water  OFF and apply about 3 tablespoons (45 ml) of CHG soap to a CLEAN washcloth.  Apply CHG soap ONLY FROM YOUR NECK DOWN TO YOUR TOES (washing for 3-5 minutes)  DO NOT use CHG soap on face, private areas, open wounds, or sores.  Pay special attention  to the area where your surgery is being performed.  If you are having back surgery, having someone wash your back for you may be helpful. Wait 2 minutes after CHG soap is applied, then you may rinse off the CHG soap.  Pat dry with a clean towel  Put on clean clothes/pajamas   If you choose to wear lotion, please use ONLY the CHG-compatible lotions that are listed below.  Additional instructions for the day of surgery: DO NOT APPLY any lotions, deodorants, cologne, or perfumes.   Do not bring valuables to the hospital. [facility] is not responsible for any belongings/valuables. Do not wear nail polish, gel polish, artificial nails, or any other type of covering on natural nails (fingers and toes) Do not wear jewelry or makeup Put on clean/comfortable clothes.  Please brush your teeth.  Ask your nurse before applying any prescription medications to the skin.     CHG Compatible Lotions   Aveeno Moisturizing lotion  Cetaphil Moisturizing Cream  Cetaphil Moisturizing Lotion  Clairol Herbal Essence Moisturizing Lotion, Dry Skin  Clairol Herbal Essence Moisturizing Lotion, Extra Dry Skin  Clairol Herbal Essence Moisturizing Lotion, Normal Skin  Curel Age  Defying Therapeutic Moisturizing Lotion with Alpha Hydroxy  Curel Extreme Care Body Lotion  Curel Soothing Hands Moisturizing Hand Lotion  Curel Therapeutic Moisturizing Cream, Fragrance-Free  Curel Therapeutic Moisturizing Lotion, Fragrance-Free  Curel Therapeutic Moisturizing Lotion, Original Formula  Eucerin Daily Replenishing Lotion  Eucerin Dry Skin Therapy Plus Alpha Hydroxy Crme  Eucerin Dry Skin Therapy Plus Alpha Hydroxy Lotion  Eucerin Original Crme  Eucerin Original Lotion  Eucerin Plus Crme Eucerin Plus Lotion  Eucerin TriLipid Replenishing Lotion  Keri Anti-Bacterial Hand Lotion  Keri Deep Conditioning Original Lotion Dry Skin Formula Softly Scented  Keri Deep Conditioning Original Lotion, Fragrance Free Sensitive Skin Formula  Keri Lotion Fast Absorbing Fragrance Free Sensitive Skin Formula  Keri Lotion Fast Absorbing Softly Scented Dry Skin Formula  Keri Original Lotion  Keri Skin Renewal Lotion Keri Silky Smooth Lotion  Keri Silky Smooth Sensitive Skin Lotion  Nivea Body Creamy Conditioning Oil  Nivea Body Extra Enriched Lotion  Nivea Body Original Lotion  Nivea Body Sheer Moisturizing Lotion Nivea Crme  Nivea Skin Firming Lotion  NutraDerm 30 Skin Lotion  NutraDerm Skin Lotion  NutraDerm Therapeutic Skin Cream  NutraDerm Therapeutic Skin Lotion  ProShield Protective Hand Cream  Provon moisturizing lotion  Please read over the following fact sheets that you were given.

## 2023-12-17 ENCOUNTER — Encounter (HOSPITAL_COMMUNITY): Payer: Self-pay

## 2023-12-17 ENCOUNTER — Other Ambulatory Visit: Payer: Self-pay

## 2023-12-17 ENCOUNTER — Encounter (HOSPITAL_COMMUNITY)
Admission: RE | Admit: 2023-12-17 | Discharge: 2023-12-17 | Disposition: A | Discharge status: Home / Self Care | Source: Ambulatory Visit | Attending: Neurosurgery | Admitting: Neurosurgery

## 2023-12-17 VITALS — BP 144/87 | HR 80 | Temp 98.3°F | Resp 18 | Ht 66.0 in | Wt 278.7 lb

## 2023-12-17 DIAGNOSIS — E119 Type 2 diabetes mellitus without complications: Secondary | ICD-10-CM | POA: Diagnosis not present

## 2023-12-17 DIAGNOSIS — G4733 Obstructive sleep apnea (adult) (pediatric): Secondary | ICD-10-CM | POA: Diagnosis not present

## 2023-12-17 DIAGNOSIS — I10 Essential (primary) hypertension: Secondary | ICD-10-CM | POA: Diagnosis not present

## 2023-12-17 DIAGNOSIS — Z86718 Personal history of other venous thrombosis and embolism: Secondary | ICD-10-CM | POA: Insufficient documentation

## 2023-12-17 DIAGNOSIS — D6862 Lupus anticoagulant syndrome: Secondary | ICD-10-CM | POA: Diagnosis not present

## 2023-12-17 DIAGNOSIS — E785 Hyperlipidemia, unspecified: Secondary | ICD-10-CM | POA: Diagnosis not present

## 2023-12-17 DIAGNOSIS — M4316 Spondylolisthesis, lumbar region: Secondary | ICD-10-CM | POA: Diagnosis not present

## 2023-12-17 DIAGNOSIS — Z01818 Encounter for other preprocedural examination: Secondary | ICD-10-CM | POA: Diagnosis present

## 2023-12-17 DIAGNOSIS — E039 Hypothyroidism, unspecified: Secondary | ICD-10-CM | POA: Diagnosis not present

## 2023-12-17 DIAGNOSIS — Z7901 Long term (current) use of anticoagulants: Secondary | ICD-10-CM | POA: Insufficient documentation

## 2023-12-17 DIAGNOSIS — Z981 Arthrodesis status: Secondary | ICD-10-CM | POA: Diagnosis not present

## 2023-12-17 DIAGNOSIS — J4489 Other specified chronic obstructive pulmonary disease: Secondary | ICD-10-CM | POA: Diagnosis not present

## 2023-12-17 DIAGNOSIS — Z7984 Long term (current) use of oral hypoglycemic drugs: Secondary | ICD-10-CM | POA: Insufficient documentation

## 2023-12-17 DIAGNOSIS — M51369 Other intervertebral disc degeneration, lumbar region without mention of lumbar back pain or lower extremity pain: Secondary | ICD-10-CM | POA: Insufficient documentation

## 2023-12-17 DIAGNOSIS — K219 Gastro-esophageal reflux disease without esophagitis: Secondary | ICD-10-CM | POA: Insufficient documentation

## 2023-12-17 DIAGNOSIS — Z01812 Encounter for preprocedural laboratory examination: Secondary | ICD-10-CM | POA: Diagnosis not present

## 2023-12-17 HISTORY — DX: Unspecified convulsions: R56.9

## 2023-12-17 HISTORY — DX: Personal history of urinary calculi: Z87.442

## 2023-12-17 LAB — BASIC METABOLIC PANEL WITH GFR
Anion gap: 11 (ref 5–15)
BUN: 15 mg/dL (ref 8–23)
CO2: 26 mmol/L (ref 22–32)
Calcium: 8.6 mg/dL — ABNORMAL LOW (ref 8.9–10.3)
Chloride: 101 mmol/L (ref 98–111)
Creatinine, Ser: 1.02 mg/dL — ABNORMAL HIGH (ref 0.44–1.00)
GFR, Estimated: 60 mL/min — ABNORMAL LOW (ref >60)
Glucose, Bld: 129 mg/dL — ABNORMAL HIGH (ref 70–99)
Potassium: 3.7 mmol/L (ref 3.5–5.1)
Sodium: 138 mmol/L (ref 135–145)

## 2023-12-17 LAB — CBC
HCT: 39.4 % (ref 36.0–46.0)
Hemoglobin: 12 g/dL (ref 12.0–15.0)
MCH: 25.3 pg — ABNORMAL LOW (ref 26.0–34.0)
MCHC: 30.5 g/dL (ref 30.0–36.0)
MCV: 83.1 fL (ref 80.0–100.0)
Platelets: 375 K/uL (ref 150–400)
RBC: 4.74 MIL/uL (ref 3.87–5.11)
RDW: 18.3 % — ABNORMAL HIGH (ref 11.5–15.5)
WBC: 6.1 K/uL (ref 4.0–10.5)
nRBC: 0 % (ref 0.0–0.2)

## 2023-12-17 LAB — SURGICAL PCR SCREEN
MRSA, PCR: NEGATIVE
Staphylococcus aureus: NEGATIVE

## 2023-12-17 LAB — HEMOGLOBIN A1C
Hgb A1c MFr Bld: 6.5 % — ABNORMAL HIGH (ref 4.8–5.6)
Mean Plasma Glucose: 139.85 mg/dL

## 2023-12-17 LAB — GLUCOSE, CAPILLARY: Glucose-Capillary: 132 mg/dL — ABNORMAL HIGH (ref 70–99)

## 2023-12-17 NOTE — Progress Notes (Addendum)
 PCP - Mercer Clotilda SAUNDERS, MD Cardiologist - Santo Stanly LABOR, MD Hematology- Cloretta Arley NOVAK, MD - manages coumadin  for clotting disorder per pt Pulmonologist- Mannam, Praveen Neurologist- no current neurologist- pt states she is unsure who she used to see in [facility] Sherlean Mandril listed in care everywhere. Pt states that she has not had a seizure in 1.5-2 years.   PPM/ICD - denies  Chest x-ray - 07/27/23 EKG - 07/30/23 Stress Test - 11/22/2008 ECHO - 07/28/23 Cardiac Cath - 02/28/21- [facility]  Sleep Study - wears CPAP nightly- unknown pressure settings. OSA managed by [facility]- Clotilda Mercer in [place]   Fasting Blood Sugar - Pt reports she has not been checking her blood sugar. She hasn't checked it in a few weeks. Unsure of last A1c. A1c drawn today. CBG 132 at PAT.    Last dose of GLP1 agonist-  Last dose Ozempic  12/11/23, last dose Invokana  12/17/23.      Blood Thinner Instructions: Coumadin - pt reports her last dose was on Saturday 12/15/23- pt reports receiving instructions from Dr. Lear office stating to hold it for 5-7 days. She will need PT-INR DOS. Aspirin  Instructions: N/A  ERAS Protcol - NPO order   COVID TEST- N/A  Per blood bank, pt will need another T/S drawn on DOS d/t pt having known red cell antibodies that require complete crossmatch and surgery is more than 3 days from PAT visit.   Anesthesia review: yes- medical history, needing clearance? Pt states she is at her baseline and denies cardiac symptoms or any new issues that would affect her having surgery.   Patient denies shortness of breath, fever, cough and chest pain at PAT appointment   All instructions explained to the patient, with a verbal understanding of the material. Patient agrees to go over the instructions while at home for a better understanding. The opportunity to ask questions was provided.

## 2023-12-17 NOTE — Progress Notes (Signed)
 Surgical Instructions   Your procedure is scheduled on Friday, October 3rd. Report to [facility] Main Entrance A at 9:30 A.M., then check in with the Admitting office. Any questions or running late day of surgery: call [phone]  Questions prior to your surgery date: call [phone], Monday-Friday, 8am-4pm. If you experience any cold or flu symptoms such as cough, fever, chills, shortness of breath, etc. between now and your scheduled surgery, please notify us  at the above number.     Remember:  Do not eat or drink after midnight the night before your surgery   Take these medicines the morning of surgery with A SIP OF WATER   ALLERGY RELIEF- Fexofenadine  cloNIDine  (CATAPRES )  diltiazem  (CARDIZEM  CD)  fluticasone  (FLONASE )  gabapentin  (NEURONTIN )  omeprazole  (PRILOSEC)  rosuvastatin  (CRESTOR )  SYMBICORT  inhaler  zonisamide  (ZONEGRAN )    May take these medicines IF NEEDED: acetaminophen  (TYLENOL )  albuterol  (ACCUNEB ) - inhaler & nebulizer (bring inhaler to hospital with you on the day of surgery) omeprazole  (PRILOSEC)  traMADol  (ULTRAM )    Follow your surgeon's instructions on when to stop warfarin (COUMADIN ).  If no instructions were given by your surgeon then you will need to call the office to get those instructions.    One week prior to surgery, STOP taking any Aspirin  (unless otherwise instructed by your surgeon) Aleve, Naproxen, Ibuprofen , Motrin , Advil , Goody's, BC's, all herbal medications, fish oil, and non-prescription vitamins. This includes your diclofenac  sodium (VOLTAREN ) gel.    WHAT DO I DO ABOUT MY DIABETES MEDICATION?   Do not take oral diabetes medicines metFORMIN  (GLUCOPHAGE )  the morning of surgery.  HOLD your INVOKANA  medication for 72 hours prior to your surgery.  Last dose on Monday, September 29th      As of today, HOLD your OZEMPIC  medication.   HOW TO MANAGE YOUR DIABETES BEFORE AND AFTER SURGERY  Why is it important to control my  blood sugar before and after surgery? Improving blood sugar levels before and after surgery helps healing and can limit problems. A way of improving blood sugar control is eating a healthy diet by:  Eating less sugar and carbohydrates  Increasing activity/exercise  Talking with your doctor about reaching your blood sugar goals High blood sugars (greater than 180 mg/dL) can raise your risk of infections and slow your recovery, so you will need to focus on controlling your diabetes during the weeks before surgery. Make sure that the doctor who takes care of your diabetes knows about your planned surgery including the date and location.  How do I manage my blood sugar before surgery? Check your blood sugar at least 4 times a day, starting 2 days before surgery, to make sure that the level is not too high or low.  Check your blood sugar the morning of your surgery when you wake up and every 2 hours until you get to the Short Stay unit.  If your blood sugar is less than 70 mg/dL, you will need to treat for low blood sugar: Do not take insulin . Treat a low blood sugar (less than 70 mg/dL) with  cup of clear juice (cranberry or apple), 4 glucose tablets, OR glucose gel. Recheck blood sugar in 15 minutes after treatment (to make sure it is greater than 70 mg/dL). If your blood sugar is not greater than 70 mg/dL on recheck, call [phone] for further instructions. Report your blood sugar to the short stay nurse when you get to Short Stay.  If you are admitted to the hospital after surgery:  Your blood sugar will be checked by the staff and you will probably be given insulin  after surgery (instead of oral diabetes medicines) to make sure you have good blood sugar levels. The [place] for blood sugar control after surgery is 80-180 mg/dL.                         Do NOT Smoke (Tobacco/Vaping) for 24 hours prior to your procedure.  If you use a CPAP at night, you may bring your mask/headgear for  your overnight stay.   You will be asked to remove any contacts, glasses, piercing's, hearing aid's, dentures/partials prior to surgery. Please bring cases for these items if needed.    Patients discharged the day of surgery will not be allowed to drive home, and someone needs to stay with them for 24 hours.  SURGICAL WAITING ROOM VISITATION Patients may have no more than 2 support people in the waiting area - these visitors may rotate.   Pre-op nurse will coordinate an appropriate time for 1 ADULT support person, who may not rotate, to accompany patient in pre-op.  Children under the age of 89 must have an adult with them who is not the patient and must remain in the main waiting area with an adult.  If the patient needs to stay at the hospital during part of their recovery, the visitor guidelines for inpatient rooms apply.  Please refer to the [facility] website for the visitor guidelines for any additional information.   If you received a COVID test during your pre-op visit  it is requested that you wear a mask when out in public, stay away from anyone that may not be feeling well and notify your surgeon if you develop symptoms. If you have been in contact with anyone that has tested positive in the last 10 days please notify you surgeon.      Pre-operative 5 CHG Bathing Instructions   You can play a key role in reducing the risk of infection after surgery. Your skin needs to be as free of germs as possible. You can reduce the number of germs on your skin by washing with CHG (chlorhexidine  gluconate) soap before surgery. CHG is an antiseptic soap that kills germs and continues to kill germs even after washing.   DO NOT use if you have an allergy to chlorhexidine /CHG or antibacterial soaps. If your skin becomes reddened or irritated, stop using the CHG and notify one of our RNs at [phone].   Please shower with the CHG soap starting 4 days before surgery using the following  schedule:     Please keep in mind the following:  DO NOT shave, including legs and underarms, starting the day of your first shower.   You may shave your face at any point before/day of surgery.  Place clean sheets on your bed the day you start using CHG soap. Use a clean washcloth (not used since being washed) for each shower. DO NOT sleep with pets once you start using the CHG.   CHG Shower Instructions:  Wash your face and private area with normal soap. If you choose to wash your hair, wash first with your normal shampoo.  After you use shampoo/soap, rinse your hair and body thoroughly to remove shampoo/soap residue.  Turn the water  OFF and apply about 3 tablespoons (45 ml) of CHG soap to a CLEAN washcloth.  Apply CHG soap ONLY FROM YOUR NECK DOWN TO YOUR TOES (washing for  3-5 minutes)  DO NOT use CHG soap on face, private areas, open wounds, or sores.  Pay special attention to the area where your surgery is being performed.  If you are having back surgery, having someone wash your back for you may be helpful. Wait 2 minutes after CHG soap is applied, then you may rinse off the CHG soap.  Pat dry with a clean towel  Put on clean clothes/pajamas   If you choose to wear lotion, please use ONLY the CHG-compatible lotions that are listed below.  Additional instructions for the day of surgery: DO NOT APPLY any lotions, deodorants, cologne, or perfumes.   Do not bring valuables to the hospital. [facility] is not responsible for any belongings/valuables. Do not wear nail polish, gel polish, artificial nails, or any other type of covering on natural nails (fingers and toes) Do not wear jewelry or makeup Put on clean/comfortable clothes.  Please brush your teeth.  Ask your nurse before applying any prescription medications to the skin.     CHG Compatible Lotions   Aveeno Moisturizing lotion  Cetaphil Moisturizing Cream  Cetaphil Moisturizing Lotion  Clairol Herbal Essence  Moisturizing Lotion, Dry Skin  Clairol Herbal Essence Moisturizing Lotion, Extra Dry Skin  Clairol Herbal Essence Moisturizing Lotion, Normal Skin  Curel Age Defying Therapeutic Moisturizing Lotion with Alpha Hydroxy  Curel Extreme Care Body Lotion  Curel Soothing Hands Moisturizing Hand Lotion  Curel Therapeutic Moisturizing Cream, Fragrance-Free  Curel Therapeutic Moisturizing Lotion, Fragrance-Free  Curel Therapeutic Moisturizing Lotion, Original Formula  Eucerin Daily Replenishing Lotion  Eucerin Dry Skin Therapy Plus Alpha Hydroxy Crme  Eucerin Dry Skin Therapy Plus Alpha Hydroxy Lotion  Eucerin Original Crme  Eucerin Original Lotion  Eucerin Plus Crme Eucerin Plus Lotion  Eucerin TriLipid Replenishing Lotion  Keri Anti-Bacterial Hand Lotion  Keri Deep Conditioning Original Lotion Dry Skin Formula Softly Scented  Keri Deep Conditioning Original Lotion, Fragrance Free Sensitive Skin Formula  Keri Lotion Fast Absorbing Fragrance Free Sensitive Skin Formula  Keri Lotion Fast Absorbing Softly Scented Dry Skin Formula  Keri Original Lotion  Keri Skin Renewal Lotion Keri Silky Smooth Lotion  Keri Silky Smooth Sensitive Skin Lotion  Nivea Body Creamy Conditioning Oil  Nivea Body Extra Enriched Lotion  Nivea Body Original Lotion  Nivea Body Sheer Moisturizing Lotion Nivea Crme  Nivea Skin Firming Lotion  NutraDerm 30 Skin Lotion  NutraDerm Skin Lotion  NutraDerm Therapeutic Skin Cream  NutraDerm Therapeutic Skin Lotion  ProShield Protective Hand Cream  Provon moisturizing lotion  Please read over the following fact sheets that you were given.

## 2023-12-18 ENCOUNTER — Telehealth: Payer: Self-pay

## 2023-12-18 NOTE — Progress Notes (Signed)
 Anesthesia Chart Review:  Case: [id number] Date/Time: 12/21/23 1114   Procedure: POSTERIOR LUMBAR FUSION 1 LEVEL - PLIF- Posterior Lateral and Interbody Fusion  L3-4   Anesthesia type: General   Diagnosis: Lumbar adjacent segment disease with spondylolisthesis [M51.369, M43.16]   Pre-op diagnosis: Lumbar adjacent segment disease with spondylolisthesis   [facility]: [facility]OM [facility] / [facility]   Surgeons: Gillie Duncans, MD       DISCUSSION: Patient is a 68 year old female scheduled for the above procedure.   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 Remote cocaine use. BMI consistent with morbid obesity.  Last pulmonology evaluation by Dr. Theophilus was on 11/21/2023. Chronic respiratory symptoms stable/improved. She had significant improvement in breathing with nebulizer use. Using Symbicort . Recently resumed CPAP for OSA after resolving her apartment air conditioning issues. One year follow-up planned.   Last cardiology follow-up with Dr. Santo was on 10/12/2022 for HTN, HLD, dyspnea, chronic diastolic CHF, PSVT (on monitor and managed with diltiazem ). No concerning arrhythmia on Zio patch in 2021. 2022 CCTA showed CAC of 0, no evidence of CAD.  2022 RHC showed mild pulmonary hypertension, PA 49/11 (mean 27). Dyspnea felt related to morbid obesity, asthma, OSA. Her most recent echo was on 07/28/2023 and showed LVEF 60-65%, no RWMA, normal RV systolic function, no significant valvular abnormalities. Echo had been done during admission for syncopal episode occurring within 24 hours of discharge for  supratherapeutic INR and anemia. Echo, CT/MRI head, CTA chest showed no acute abnormalities. Syncope felt most likely related to polypharmacy (confusion about home medications).   A1c 6.5%. She is on Ozempic , metformin , Invokana . Last Ozempic  12/11/2023. Last Invokana  12/17/2023.   Last visit with hematologist Dr. Cloretta was on 08/27/2023 for ongoing management of warfarin. She reported last warfarin 12/15/2023. For PT/INR on the day of surgery.    VS: BP (!) 144/87   Pulse 80   Temp 36.8 C   Resp 18   Ht 5' 6 (1.676 m)   Wt 126.4 kg   SpO2 97%   BMI 44.98 kg/m   PROVIDERS: Mercer Clotilda SAUNDERS, MD is PCP  Mannam, Praveen, MD is pulmonologist Santo Kelly, MD is cardiologist Cloretta Arley NOVAK, MD is CHERIE Lawton Handler, MD was her neurologist. No seizure in > 1.5 years. She is on Zonegran .    LABS: Labs reviewed: Acceptable for surgery. (all labs ordered are listed, but only abnormal results are displayed)  Labs Reviewed  GLUCOSE, CAPILLARY - Abnormal; Notable for the following components:      Result Value   Glucose-Capillary 132 (*)    All other components within normal limits  HEMOGLOBIN A1C - Abnormal; Notable for the following components:   Hgb A1c MFr Bld 6.5 (*)    All other components within normal limits  BASIC METABOLIC PANEL WITH GFR - Abnormal; Notable for the following components:   Glucose, Bld 129 (*)    Creatinine, Ser 1.02 (*)    Calcium  8.6 (*)    GFR, Estimated 60 (*)    All other components within normal limits  CBC - Abnormal; Notable for the following components:   MCH 25.3 (*)    RDW 18.3 (*)    All  other components within normal limits  SURGICAL PCR SCREEN  TYPE AND SCREEN    OTHER: EEG 07/27/2023: study is within normal limits. No seizures or epileptiform discharges were seen throughout the recording.   PFTs 08/12/2019: FVC 1.81 [64%], FEV1 1.68 [77%], F/F 93, TLC 3.21 [60%], DLCO 22.89 [107%] Moderate restriction, bronchodilator  response. Restriction felt likely from body habitus.   IMAGES: CT L-spine 11/23/2023: IMPRESSION: 1. L4-L5 interbody fusion. 2. Spondylosis at the lumbar and visible lower thoracic levels, as outlined within the body of the report and not significantly changed from the recent prior lumbar spine MRI of 10/12/2023. 3. At L3-L4, there is multifactorial moderate spinal canal stenosis. Bilateral neural foraminal narrowing (moderate right, mild-to-moderate left). 4. At T11-T12, there is multifactorial moderate spinal canal stenosis (with mild spinal cord flattening). Mild left neural foraminal narrowing. 5. No more than mild spinal canal stenosis at the remaining levels. 6. Additional sites of foraminal stenosis, greatest bilaterally at L5-S1 (moderate at this level). 7. Facet arthropathy is greatest at L3-L4 (advanced) and L5-S1 (moderate-to-advanced). 8. Levocurvature of the lumbar and visible lower thoracic spine.   MRI Abd 08/02/2023: IMPRESSION: 1. Unchanged right adrenal nodule measuring 3.3 x 2.6 cm which demonstrates significant internal macroscopic fat content on in and opposed phase imaging. This has been seen on numerous prior examinations of the abdomen dating back to at least 2013 and is unchanged over the long period of follow-up. 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 2. Mild hepatic steatosis.  MRI Brain 07/27/2023: IMPRESSION: 1. No acute intracranial abnormality. 2. Generalized age-related cerebral atrophy with mild to moderate chronic microvascular ischemic disease.  CTA Chest 07/27/2023: IMPRESSION: 1. No evidence of pulmonary embolism to the segmental level.  CT Head 07/27/2023: IMPRESSION: 1. No acute intracranial  abnormality, specifically, no acute hemorrhage, territorial infarction, or intracranial mass. 2. Sequelae of mild, chronic ischemic microvascular disease with chronic lacunar infarct in the left basal ganglia, unchanged.     EKG: 07/27/2023: SR at 76 bpm   CV: Echo 07/28/2023: IMPRESSIONS   1. Left ventricular ejection fraction, by estimation, is 60 to 65%. The  left ventricle has normal function. The left ventricle has no regional  wall motion abnormalities. Left ventricular diastolic parameters are  indeterminate.   2. Right ventricular systolic function is normal. The right ventricular  size is normal. Tricuspid regurgitation signal is inadequate for assessing  PA pressure.   3. The mitral valve is normal in structure. No evidence of mitral valve  regurgitation. No evidence of mitral stenosis.   4. The aortic valve is normal in structure. Aortic valve regurgitation is  not visualized. No aortic stenosis is present.   5. The inferior vena cava is normal in size with greater than 50%  respiratory variability, suggesting right atrial pressure of 3 mmHg.    BLE Venous US  07/25/2023: Summary:  BILATERAL:  - No evidence of deep vein thrombosis seen in the lower extremities,  bilaterally.  -No evidence of popliteal cyst, bilaterally.      CTA Coronary 05/07/2020: IMPRESSION: 1. Coronary calcium  score of 0. This was 1st percentile for age, sex, and race matched control. 2. Normal coronary origin with right dominance. 3. CAD-RADS 0. No evidence of CAD (0%). Consider non-atherosclerotic causes of discomfort. 4.  Aortic atherosclerosis noted.  Long term monitor 03/01/2020 - 03/15/2020: Patient had a minimum heart rate of 67 bpm, maximum heart rate of 164 bpm, and average heart rate of 88 bpm. Predominant underlying rhythm was sinus rhythm. One run of ventricular tachycardia occurred lasting 5 beats at longest with a max rate of 164 bpm at fastest. Thirteen runs of supraventricular  tachycardia occurred lasting 14 beats at longest with a max rate of 162 bpm at fastest. Isolated PACs were rare (<1.0%), with rare couplets and triplets present. Isolated PVCs were rare (<1.0%), with rare couplets and triplets present; bigeminy and trigeminy present. No evidence of complete heart block . Triggered and diary events associated overwhelming with sinus rhythm or sinus tachycardia; once with PVC.   No malignant arrhythmias.   RHC 02/28/2021:   Hemodynamic findings consistent with mild pulmonary hypertension.   RA 15 RV 47/6/13 PA 49/11 (mean 27)-Mild pulmonary HTN PCWP 17    US  Carotid 12/05/2017: Final Interpretation:  - Right Carotid: There is no evidence of stenosis in the right ICA.  - Left Carotid: There is no evidence of stenosis in the left ICA.  - Vertebrals: Bilateral vertebral arteries demonstrate antegrade flow. This was somewhat of a technically difficult exam due to depth of vessels.    Past Medical History:  Diagnosis Date   Adrenal mass 03/226   Benign   Arthritis    knees/multiple orthopedic conditons; lower back   Asthma    per pt   Clotting disorder    +beta-2 -glycoprotein IgA antibody   COPD (chronic obstructive pulmonary disease) (HCC)    inhalers dependent on environment   Depression    Diabetes mellitus    120s usually fasting -  dx more than 10 yrs ago   Dizziness, nonspecific    DVT (deep venous thrombosis) (HCC)    Recurrent   Fibromyalgia    GERD (gastroesophageal reflux disease)    Heart murmur    History of blood transfusion    History of cocaine abuse (HCC)    Remote history    History of kidney stones    Hyperlipidemia    Hypertension    takes meds daily   Hypothyroidism    Lupus Anticoagulant Positive    On home oxygen  therapy    at night   Seizure (HCC)    no seizure in 1.5-2 years per patient (12/17/23)   Shortness of breath    exertion or lying flat   Sleep apnea    2l of oxygen  at night (as of 12/6, she used  to)- wears CPAP    Past Surgical History:  Procedure Laterality Date   ABDOMINAL HYSTERECTOMY  1989   Fibroids   ANTERIOR LUMBAR FUSION  01/03/2012   Procedure: ANTERIOR LUMBAR FUSION 1 LEVEL;  Surgeon: Rockey LITTIE Peru, MD;  Location: [facility];  Service: Neurosurgery;  Laterality: N/A;  Lumbar Four-Five Anterior Lumbar Interbody Fusion with Instrumentation   BACK SURGERY     cervical spine---disk disease   CARPAL TUNNEL RELEASE     Bilateral   CESAREAN SECTION     CHOLECYSTECTOMY     CYSTOSCOPY/RETROGRADE/URETEROSCOPY/STONE EXTRACTION WITH BASKET Right 04/26/2014   Procedure: CYSTOSCOPY/ RIGHT RETROGRADE/ RIGHT URETEROSCOPY/URETERAL AND RENAL PELVIS BIOPSY;  Surgeon: Ricardo Likens, MD;  Location: [facility];  Service: Urology;  Laterality: Right;   ESOPHAGOGASTRODUODENOSCOPY (EGD) WITH PROPOFOL  N/A 08/25/2022   Procedure: ESOPHAGOGASTRODUODENOSCOPY (EGD) WITH PROPOFOL ;  Surgeon: Rollin Dover, MD;  Location: [facility];  Service: Gastroenterology;  Laterality: N/A;   gallstones removed  HEMOSTASIS CLIP PLACEMENT  08/25/2022   Procedure: HEMOSTASIS CLIP PLACEMENT;  Surgeon: Rollin Dover, MD;  Location: [facility];  Service: Gastroenterology;;   HOT HEMOSTASIS N/A 08/25/2022   Procedure: HOT HEMOSTASIS (ARGON PLASMA COAGULATION/BICAP);  Surgeon: Rollin Dover, MD;  Location: [facility];  Service: Gastroenterology;  Laterality: N/A;   POLYPECTOMY  08/25/2022   Procedure: POLYPECTOMY;  Surgeon: Rollin Dover, MD;  Location: [facility];  Service: Gastroenterology;;   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   bilateral   right elbow surgery     RIGHT HEART CATH N/A 02/28/2021   Procedure: RIGHT HEART CATH;  Surgeon: Verlin Lonni BIRCH, MD;  Location: [facility]AS[facility] CV [facility];  Service: Cardiovascular;  Laterality: N/A;   THYROID  LOBECTOMY Right 02/27/2017   THYROID  LOBECTOMY Right 02/27/2017   Procedure: RIGHT THYROID  LOBECTOMY;  Surgeon: Vanderbilt Ned, MD;  Location: [facility];  Service:  General;  Laterality: Right;   TUBAL LIGATION      MEDICATIONS:  acetaminophen  (TYLENOL ) 500 MG tablet   albuterol  (ACCUNEB ) 0.63 MG/3ML nebulizer solution   albuterol  (VENTOLIN  HFA) 108 (90 Base) MCG/ACT inhaler   ALLERGY RELIEF 180 MG tablet   Blood Glucose Monitoring Suppl (ACCU-CHEK GUIDE) w/Device KIT   cloNIDine  (CATAPRES ) 0.1 MG tablet   diclofenac  sodium (VOLTAREN ) 1 % GEL   diltiazem  (CARDIZEM  CD) 240 MG 24 hr capsule   DULoxetine  (CYMBALTA ) 30 MG capsule   FEROSUL 325 (65 Fe) MG tablet   fluticasone  (FLONASE ) 50 MCG/ACT nasal spray   furosemide  (LASIX ) 80 MG tablet   gabapentin  (NEURONTIN ) 300 MG capsule   INVOKANA  100 MG TABS tablet   lidocaine  (LIDODERM ) 5 %   LINZESS  145 MCG CAPS capsule   metFORMIN  (GLUCOPHAGE ) 500 MG tablet   omeprazole  (PRILOSEC) 20 MG capsule   OZEMPIC , 2 MG/DOSE, 8 MG/3ML SOPN   potassium chloride  (MICRO-K ) 10 MEQ CR capsule   QUEtiapine  (SEROQUEL ) 100 MG tablet   rosuvastatin  (CRESTOR ) 10 MG tablet   spironolactone  (ALDACTONE ) 25 MG tablet   SYMBICORT  160-4.5 MCG/ACT inhaler   traMADol  (ULTRAM ) 50 MG tablet   warfarin (COUMADIN ) 10 MG tablet   warfarin (COUMADIN ) 7.5 MG tablet   zonisamide  (ZONEGRAN ) 100 MG capsule    sodium chloride  flush (NS) 0.9 % injection 3 mL    Isaiah Ruder, PA-C Surgical Short Stay/Anesthesiology [facility] Phone [phone] [facility] Phone [phone] 12/18/2023 5:42 PM

## 2023-12-18 NOTE — Telephone Encounter (Signed)
 The patient contacted us  to inform that she will be undergoing lumbar surgery on December 21, 2023. I advised the patient that I will forward her message to the provider.

## 2023-12-18 NOTE — Anesthesia Preprocedure Evaluation (Addendum)
 Anesthesia Evaluation  Patient identified by MRN, date of birth, ID band Patient awake    Reviewed: Allergy & Precautions, NPO status , Patient's Chart, lab work & pertinent test results  History of Anesthesia Complications Negative for: history of anesthetic complications  Airway Mallampati: III       Dental  (+) Partial Upper, Partial Lower, Missing, Dental Advisory Given,    Pulmonary shortness of breath, asthma , sleep apnea and Continuous Positive Airway Pressure Ventilation , COPD   breath sounds clear to auscultation       Cardiovascular hypertension, Pt. on medications (-) angina +CHF  (-) Past MI  Rhythm:Regular  1. Left ventricular ejection fraction, by estimation, is 60 to 65%. The  left ventricle has normal function. The left ventricle has no regional  wall motion abnormalities. Left ventricular diastolic parameters are  indeterminate.   2. Right ventricular systolic function is normal. The right ventricular  size is normal. Tricuspid regurgitation signal is inadequate for assessing  PA pressure.   3. The mitral valve is normal in structure. No evidence of mitral valve  regurgitation. No evidence of mitral stenosis.   4. The aortic valve is normal in structure. Aortic valve regurgitation is  not visualized. No aortic stenosis is present.   5. The inferior vena cava is normal in size with greater than 50%  respiratory variability, suggesting right atrial pressure of 3 mmHg.   FINDINGS     Neuro/Psych  PSYCHIATRIC DISORDERS Anxiety Depression     Neuromuscular disease    GI/Hepatic ,GERD  Medicated and Controlled,,  Endo/Other  diabetesHypothyroidism  Class 3 obesity  Renal/GU      Musculoskeletal  (+) Arthritis ,  Fibromyalgia -  Abdominal   Peds  Hematology negative hematology ROS (+) Lab Results      Component                Value               Date                      WBC                       6.1                 12/17/2023                HGB                      12.0                12/17/2023                HCT                      39.4                12/17/2023                MCV                      83.1                12/17/2023                PLT  375                 12/17/2023              Anesthesia Other Findings Last pulmonology evaluation by Dr. Theophilus was on 11/21/2023. Chronic respiratory symptoms stable/improved. She had significant improvement in breathing with nebulizer use. Using Symbicort . Recently resumed CPAP for OSA after resolving her apartment air conditioning issues. One year follow-up planned.    Last cardiology follow-up with Dr. Santo was on 10/12/2022 for HTN, HLD, dyspnea, chronic diastolic CHF, PSVT (on monitor and managed with diltiazem ). No concerning arrhythmia on Zio patch in 2021. 2022 CCTA showed CAC of 0, no evidence of CAD.  2022 RHC showed mild pulmonary hypertension, PA 49/11 (mean 27). Dyspnea felt related to morbid obesity, asthma, OSA. Her most recent echo was on 07/28/2023 and showed LVEF 60-65%, no RWMA, normal RV systolic function, no significant valvular abnormalities. Echo had been done during admission for syncopal episode occurring within 24 hours of discharge for supratherapeutic INR and anemia. Echo, CT/MRI head, CTA chest showed no acute abnormalities. Syncope felt most likely related to polypharmacy (confusion about home medications).    Reproductive/Obstetrics                              Anesthesia Physical Anesthesia Plan  ASA: 3  Anesthesia Plan: General   Post-op Pain Management: Ofirmev  IV (intra-op)*   Induction: Intravenous  PONV Risk Score and Plan: 4 or greater and Ondansetron  and Dexamethasone   Airway Management Planned: Oral ETT  Additional Equipment: None  Intra-op Plan:   Post-operative Plan: Extubation in OR  Informed Consent: I have reviewed the patients  History and Physical, chart, labs and discussed the procedure including the risks, benefits and alternatives for the proposed anesthesia with the patient or authorized representative who has indicated his/her understanding and acceptance.     Dental advisory given  Plan Discussed with: CRNA  Anesthesia Plan Comments: (PAT note written 12/18/2023 by Ysabella Babiarz, PA-C. Surgery rescheduled from 12/21/2023 to 01/11/2024.  Reported last Ozempic  and Coumadin  01/04/2024.   )         Anesthesia Quick Evaluation

## 2023-12-19 NOTE — Communication Body (Addendum)
 Communications Note ___________________________________________________________ Patient Name [name] Date of Birth 1955-11-27 Date of Call 12/17/2023 Home phone [phone] Cell phone [phone] Day phone Alternate phone [phone] ___________________________________________________________ Beatris with: Time of call: 9:42 AM Call taken by: Roseline MICAEL Lever Contact type: outgoing call Call type: other Telephone Contact Detail Date  Time  Employee  Detail   12/17/2023  9:42 AM  Roseline MICAEL Lever  Left Message [employer] Outcomes patient, transfer to [phone]   12/17/2023  12:55 PM  Darice MICAEL Macadam  Spoke to [place]on at [facility] blood bank. Pt was there for pre op and she needed to know how much blood the patient would need due to antibodies. Per Levon, advised patient will need 2 units.    Provider: Gillie MD, Rockey CROME 12/17/2023 12:55 PM    Document generated by: Darice MICAEL Macadam

## 2023-12-24 LAB — TYPE AND SCREEN
ABO/RH(D): A POS
Antibody Screen: POSITIVE
Donor AG Type: NEGATIVE
Donor AG Type: NEGATIVE
Unit division: 0
Unit division: 0

## 2023-12-24 LAB — BPAM RBC
Blood Product Expiration Date: 202510252359
Blood Product Expiration Date: 202510252359
Unit Type and Rh: 6200
Unit Type and Rh: 6200

## 2023-12-28 DIAGNOSIS — G4733 Obstructive sleep apnea (adult) (pediatric): Secondary | ICD-10-CM | POA: Diagnosis not present

## 2024-01-02 ENCOUNTER — Encounter: Payer: Self-pay | Admitting: Podiatry

## 2024-01-02 ENCOUNTER — Ambulatory Visit: Admitting: Podiatry

## 2024-01-02 DIAGNOSIS — B351 Tinea unguium: Secondary | ICD-10-CM

## 2024-01-02 DIAGNOSIS — M79674 Pain in right toe(s): Secondary | ICD-10-CM | POA: Diagnosis not present

## 2024-01-02 DIAGNOSIS — E1142 Type 2 diabetes mellitus with diabetic polyneuropathy: Secondary | ICD-10-CM | POA: Diagnosis not present

## 2024-01-02 DIAGNOSIS — M79675 Pain in left toe(s): Secondary | ICD-10-CM | POA: Diagnosis not present

## 2024-01-06 NOTE — Progress Notes (Signed)
  Subjective:  Patient ID: [name], female    DOB: [birth year],  MRN: [id number]  68 y.o. female presents at risk foot care with history of diabetic neuropathy and painful mycotic toenails of both feet that are difficult to trim. Pain interferes with daily activities and wearing enclosed shoe gear comfortably.  Chief Complaint  Patient presents with   Diabetes    DFC NIDDM A1C 6.5. Toenail trim. LOV with PCP 11/08/23.    New problem(s): None   PCP is Mercer Clotilda SAUNDERS, MD.  Allergies  Allergen Reactions   Lisinopril  Anaphylaxis, Swelling and Other (See Comments)    THROAT SWELLING, Pt states her throat started to close up after being on it for so long.    Prednisone  Anaphylaxis, Swelling and Other (See Comments)    Throat swelling and tongue irritation   Spironolactone  Other (See Comments)    Caused stroke-like symptoms with the mouth   Corticosteroids Rash    States she developed a rash on her tongue after a steroid injection in her back    Review of Systems: Negative except as noted in the HPI.   Objective:  [name] is a pleasant 68 y.o. female morbidly obese in NAD. AAO x 3.  Vascular Examination: Vascular status intact b/l with palpable pedal pulses. CFT immediate b/l. Pedal hair present. No edema. No pain with calf compression b/l. Skin temperature gradient WNL b/l. No varicosities noted. No cyanosis or clubbing noted.  Neurological Examination: Pt has subjective symptoms of neuropathy. Protective sensation diminished with 10g monofilament b/l.  Dermatological Examination: Pedal skin warm and supple b/l. No open wounds b/l. No interdigital macerations. Toenails 2-5 b/l thick, discolored, elongated with subungual debris and pain on dorsal palpation.  Anonychia noted bilateral great toes. Nailbed(s) epithelialized with dorsal hyperkeratosis.   Musculoskeletal Examination: Muscle strength 5/5 to all lower extremity muscle groups bilaterally. Pes  planus deformity noted bilateral LE.  Radiographs: None  Last A1c:      Latest Ref Rng & Units 12/17/2023   11:50 AM 07/28/2023    9:54 AM 07/11/2023   10:01 AM  Hemoglobin A1C  Hemoglobin-A1c 4.8 - 5.6 % 6.5  6.5  6.2      Assessment:   1. Pain due to onychomycosis of toenails of both feet   2. Diabetic peripheral neuropathy associated with type 2 diabetes mellitus (HCC)    Plan:  Patient was evaluated and treated. All patient's and/or POA's questions/concerns addressed on today's visit. Toenails 2-5 bilaterally debrided in length and girth without incident. Continue foot and shoe inspections daily. Monitor blood glucose per PCP/Endocrinologist's recommendations. Continue soft, supportive shoe gear daily. Report any pedal injuries to medical professional. Call office if there are any questions/concerns. -Patient/POA to call should there be question/concern in the interim.  Return in about 3 months (around 04/03/2024).  Delon LITTIE Merlin, DPM      [place] LOCATION: [address], [place] [address]                   Office [phone]   [facility] LOCATION: [address] [place], [place] [address] Office [phone]

## 2024-01-07 ENCOUNTER — Telehealth: Payer: Self-pay | Admitting: Internal Medicine

## 2024-01-07 MED ORDER — SPIRONOLACTONE 25 MG PO TABS: 12.5000 mg | ORAL_TABLET | Freq: Every day | ORAL | 0 refills | Status: AC

## 2024-01-07 NOTE — Telephone Encounter (Signed)
*  STAT* If patient is at the pharmacy, call can be transferred to refill team.   1. Which medications need to be refilled? (please list name of each medication and dose if known)   spironolactone  (ALDACTONE ) 25 MG tablet    2. Which pharmacy/location (including street and city if local pharmacy) is medication to be sent to?  [facility] - [place]  - [place], [place] - [phone]      3. Do they need a 30 day or 90 day supply? 90 day    Pt is out of medication

## 2024-01-07 NOTE — Telephone Encounter (Signed)
 RX sent in

## 2024-01-08 ENCOUNTER — Other Ambulatory Visit: Payer: Self-pay | Admitting: Oncology

## 2024-01-08 ENCOUNTER — Other Ambulatory Visit: Payer: Self-pay | Admitting: Internal Medicine

## 2024-01-08 DIAGNOSIS — I82403 Acute embolism and thrombosis of unspecified deep veins of lower extremity, bilateral: Secondary | ICD-10-CM

## 2024-01-09 NOTE — Progress Notes (Signed)
 SDW update. Informed patient of her new surgery date/time of 01/11/2024 at 0900.  NPO is in effect. States last dose of Ozempic  and Coumadin  was 01/04/2024.

## 2024-01-10 ENCOUNTER — Other Ambulatory Visit: Payer: Self-pay | Admitting: Oncology

## 2024-01-10 ENCOUNTER — Other Ambulatory Visit: Payer: Self-pay | Admitting: Internal Medicine

## 2024-01-11 ENCOUNTER — Encounter (HOSPITAL_COMMUNITY): Payer: Self-pay | Admitting: Neurosurgery

## 2024-01-11 ENCOUNTER — Ambulatory Visit (HOSPITAL_COMMUNITY): Payer: Self-pay | Admitting: Vascular Surgery

## 2024-01-11 ENCOUNTER — Ambulatory Visit (HOSPITAL_COMMUNITY)

## 2024-01-11 ENCOUNTER — Observation Stay (HOSPITAL_COMMUNITY)
Admission: RE | Admit: 2024-01-11 | Discharge: 2024-01-15 | Disposition: A | Discharge status: Home / Self Care | Attending: Neurosurgery | Admitting: Neurosurgery

## 2024-01-11 ENCOUNTER — Ambulatory Visit (HOSPITAL_COMMUNITY): Admitting: Vascular Surgery

## 2024-01-11 ENCOUNTER — Encounter (HOSPITAL_COMMUNITY)
Admission: RE | Disposition: A | Discharge status: Home / Self Care | Payer: Self-pay | Source: Home / Self Care | Attending: Neurosurgery

## 2024-01-11 DIAGNOSIS — I5032 Chronic diastolic (congestive) heart failure: Secondary | ICD-10-CM

## 2024-01-11 DIAGNOSIS — I110 Hypertensive heart disease with heart failure: Secondary | ICD-10-CM | POA: Diagnosis not present

## 2024-01-11 DIAGNOSIS — J449 Chronic obstructive pulmonary disease, unspecified: Secondary | ICD-10-CM | POA: Insufficient documentation

## 2024-01-11 DIAGNOSIS — E119 Type 2 diabetes mellitus without complications: Secondary | ICD-10-CM | POA: Diagnosis not present

## 2024-01-11 DIAGNOSIS — I509 Heart failure, unspecified: Secondary | ICD-10-CM | POA: Insufficient documentation

## 2024-01-11 DIAGNOSIS — Z981 Arthrodesis status: Secondary | ICD-10-CM | POA: Diagnosis not present

## 2024-01-11 DIAGNOSIS — M4316 Spondylolisthesis, lumbar region: Principal | ICD-10-CM | POA: Insufficient documentation

## 2024-01-11 DIAGNOSIS — M48062 Spinal stenosis, lumbar region with neurogenic claudication: Secondary | ICD-10-CM | POA: Diagnosis not present

## 2024-01-11 DIAGNOSIS — M51369 Other intervertebral disc degeneration, lumbar region without mention of lumbar back pain or lower extremity pain: Secondary | ICD-10-CM | POA: Diagnosis not present

## 2024-01-11 DIAGNOSIS — E039 Hypothyroidism, unspecified: Secondary | ICD-10-CM | POA: Diagnosis not present

## 2024-01-11 DIAGNOSIS — Z01818 Encounter for other preprocedural examination: Secondary | ICD-10-CM

## 2024-01-11 LAB — GLUCOSE, CAPILLARY
Glucose-Capillary: 108 mg/dL — ABNORMAL HIGH (ref 70–99)
Glucose-Capillary: 112 mg/dL — ABNORMAL HIGH (ref 70–99)
Glucose-Capillary: 168 mg/dL — ABNORMAL HIGH (ref 70–99)

## 2024-01-11 LAB — PROTIME-INR
INR: 0.9 (ref 0.8–1.2)
Prothrombin Time: 12.7 s (ref 11.4–15.2)

## 2024-01-11 SURGERY — POSTERIOR LUMBAR FUSION 1 LEVEL
Anesthesia: General | Site: Spine Lumbar

## 2024-01-11 MED ORDER — ACETAMINOPHEN 325 MG PO TABS: 650.0000 mg | ORAL_TABLET | ORAL | Status: DC | PRN

## 2024-01-11 MED ORDER — 0.9 % SODIUM CHLORIDE (POUR BTL) OPTIME
TOPICAL | Status: DC | PRN
  Administered 2024-01-11: 1000 mL

## 2024-01-11 MED ORDER — SPIRONOLACTONE 12.5 MG HALF TABLET
12.5000 mg | ORAL_TABLET | Freq: Every day | ORAL | Status: DC
  Administered 2024-01-11 – 2024-01-15 (×5): 12.5 mg via ORAL
  Filled 2024-01-11 (×5): qty 1

## 2024-01-11 MED ORDER — FERROUS SULFATE 325 (65 FE) MG PO TABS
325.0000 mg | ORAL_TABLET | Freq: Every day | ORAL | Status: DC
  Administered 2024-01-12 – 2024-01-15 (×4): 325 mg via ORAL
  Filled 2024-01-11 (×4): qty 1

## 2024-01-11 MED ORDER — SUGAMMADEX SODIUM 200 MG/2ML IV SOLN
INTRAVENOUS | Status: DC | PRN
  Administered 2024-01-11: 200 mg via INTRAVENOUS

## 2024-01-11 MED ORDER — POTASSIUM CHLORIDE IN NACL 20-0.9 MEQ/L-% IV SOLN: INTRAVENOUS | Status: DC

## 2024-01-11 MED ORDER — PANTOPRAZOLE SODIUM 40 MG PO TBEC
40.0000 mg | DELAYED_RELEASE_TABLET | Freq: Every day | ORAL | Status: DC
  Administered 2024-01-11 – 2024-01-15 (×5): 40 mg via ORAL
  Filled 2024-01-11 (×5): qty 1

## 2024-01-11 MED ORDER — EMPAGLIFLOZIN 10 MG PO TABS
10.0000 mg | ORAL_TABLET | Freq: Every day | ORAL | Status: DC
Start: 2024-01-12 — End: 2024-01-15
  Administered 2024-01-12 – 2024-01-15 (×4): 10 mg via ORAL
  Filled 2024-01-11 (×5): qty 1

## 2024-01-11 MED ORDER — BUPIVACAINE HCL (PF) 0.5 % IJ SOLN
INTRAMUSCULAR | Status: DC | PRN
  Administered 2024-01-11: 20 mL

## 2024-01-11 MED ORDER — PHENOL 1.4 % MT LIQD
1.0000 | OROMUCOSAL | Status: DC | PRN
Start: 2024-01-11 — End: 2024-01-15

## 2024-01-11 MED ORDER — LACTATED RINGERS IV SOLN: INTRAVENOUS | Status: DC

## 2024-01-11 MED ORDER — LINACLOTIDE 145 MCG PO CAPS
145.0000 ug | ORAL_CAPSULE | ORAL | Status: DC
  Administered 2024-01-12: 145 ug via ORAL
  Filled 2024-01-11 (×2): qty 1

## 2024-01-11 MED ORDER — ONDANSETRON HCL 4 MG/2ML IJ SOLN
INTRAMUSCULAR | Status: AC
  Filled 2024-01-11: qty 2

## 2024-01-11 MED ORDER — ALBUMIN HUMAN 5 % IV SOLN: INTRAVENOUS | Status: DC | PRN

## 2024-01-11 MED ORDER — CEFAZOLIN SODIUM-DEXTROSE 2-4 GM/100ML-% IV SOLN
2.0000 g | Freq: Three times a day (TID) | INTRAVENOUS | Status: AC
  Administered 2024-01-11 – 2024-01-12 (×2): 2 g via INTRAVENOUS
  Filled 2024-01-11 (×2): qty 100

## 2024-01-11 MED ORDER — ONDANSETRON HCL 4 MG PO TABS: 4.0000 mg | ORAL_TABLET | Freq: Four times a day (QID) | ORAL | Status: DC | PRN

## 2024-01-11 MED ORDER — FENTANYL CITRATE (PF) 250 MCG/5ML IJ SOLN
INTRAMUSCULAR | Status: DC | PRN
  Administered 2024-01-11 (×3): 50 ug via INTRAVENOUS

## 2024-01-11 MED ORDER — HYDROCODONE-ACETAMINOPHEN 7.5-325 MG PO TABS
1.0000 | ORAL_TABLET | Freq: Four times a day (QID) | ORAL | Status: DC
  Administered 2024-01-11 – 2024-01-15 (×12): 1 via ORAL
  Filled 2024-01-11 (×13): qty 1

## 2024-01-11 MED ORDER — LIDOCAINE-EPINEPHRINE 0.5 %-1:200000 IJ SOLN
INTRAMUSCULAR | Status: AC
  Filled 2024-01-11: qty 50

## 2024-01-11 MED ORDER — THROMBIN 20000 UNITS EX SOLR
CUTANEOUS | Status: DC | PRN
  Administered 2024-01-11: 20 mL via TOPICAL

## 2024-01-11 MED ORDER — DIAZEPAM 5 MG PO TABS
5.0000 mg | ORAL_TABLET | Freq: Four times a day (QID) | ORAL | Status: DC | PRN
  Administered 2024-01-12: 5 mg via ORAL
  Filled 2024-01-11: qty 1

## 2024-01-11 MED ORDER — CEFAZOLIN SODIUM-DEXTROSE 3-4 GM/150ML-% IV SOLN
3.0000 g | INTRAVENOUS | Status: AC
  Administered 2024-01-11: 3 g via INTRAVENOUS
  Filled 2024-01-11: qty 150

## 2024-01-11 MED ORDER — ALBUTEROL SULFATE (2.5 MG/3ML) 0.083% IN NEBU: 2.5000 mg | INHALATION_SOLUTION | RESPIRATORY_TRACT | Status: DC | PRN

## 2024-01-11 MED ORDER — BUPIVACAINE HCL (PF) 0.5 % IJ SOLN
INTRAMUSCULAR | Status: AC
  Filled 2024-01-11: qty 30

## 2024-01-11 MED ORDER — ROCURONIUM BROMIDE 10 MG/ML (PF) SYRINGE
PREFILLED_SYRINGE | INTRAVENOUS | Status: DC | PRN
  Administered 2024-01-11 (×2): 10 mg via INTRAVENOUS
  Administered 2024-01-11: 20 mg via INTRAVENOUS
  Administered 2024-01-11: 80 mg via INTRAVENOUS

## 2024-01-11 MED ORDER — MIDAZOLAM HCL (PF) 2 MG/2ML IJ SOLN
INTRAMUSCULAR | Status: DC | PRN
  Administered 2024-01-11: 2 mg via INTRAVENOUS

## 2024-01-11 MED ORDER — FENTANYL CITRATE (PF) 100 MCG/2ML IJ SOLN
INTRAMUSCULAR | Status: AC
  Filled 2024-01-11: qty 2

## 2024-01-11 MED ORDER — FUROSEMIDE 40 MG PO TABS
40.0000 mg | ORAL_TABLET | Freq: Every day | ORAL | Status: DC
Start: 2024-01-11 — End: 2024-01-15
  Administered 2024-01-11 – 2024-01-15 (×5): 40 mg via ORAL
  Filled 2024-01-11 (×5): qty 1

## 2024-01-11 MED ORDER — POTASSIUM CHLORIDE CRYS ER 20 MEQ PO TBCR
20.0000 meq | EXTENDED_RELEASE_TABLET | Freq: Every day | ORAL | Status: DC
  Administered 2024-01-11 – 2024-01-14 (×4): 20 meq via ORAL
  Filled 2024-01-11 (×4): qty 1

## 2024-01-11 MED ORDER — ACETAMINOPHEN 10 MG/ML IV SOLN
INTRAVENOUS | Status: DC | PRN
  Administered 2024-01-11: 1000 mg via INTRAVENOUS

## 2024-01-11 MED ORDER — DULOXETINE HCL 60 MG PO CPEP
90.0000 mg | ORAL_CAPSULE | Freq: Every day | ORAL | Status: DC
  Administered 2024-01-11 – 2024-01-14 (×4): 90 mg via ORAL
  Filled 2024-01-11 (×4): qty 1

## 2024-01-11 MED ORDER — OXYCODONE HCL 5 MG PO TABS
5.0000 mg | ORAL_TABLET | Freq: Once | ORAL | Status: AC | PRN
  Administered 2024-01-11: 5 mg via ORAL

## 2024-01-11 MED ORDER — CLONIDINE HCL 0.1 MG PO TABS
0.1000 mg | ORAL_TABLET | Freq: Two times a day (BID) | ORAL | Status: DC
  Administered 2024-01-11 – 2024-01-15 (×8): 0.1 mg via ORAL
  Filled 2024-01-11 (×8): qty 1

## 2024-01-11 MED ORDER — EPHEDRINE 5 MG/ML INJ
INTRAVENOUS | Status: AC
  Filled 2024-01-11: qty 5

## 2024-01-11 MED ORDER — ACETAMINOPHEN 10 MG/ML IV SOLN
INTRAVENOUS | Status: AC
  Filled 2024-01-11: qty 100

## 2024-01-11 MED ORDER — HYDROMORPHONE HCL 1 MG/ML IJ SOLN
INTRAMUSCULAR | Status: AC
  Filled 2024-01-11: qty 1

## 2024-01-11 MED ORDER — OXYCODONE HCL 5 MG PO TABS
5.0000 mg | ORAL_TABLET | ORAL | Status: DC | PRN
  Administered 2024-01-12: 5 mg via ORAL
  Filled 2024-01-11: qty 1

## 2024-01-11 MED ORDER — EPHEDRINE SULFATE-NACL 50-0.9 MG/10ML-% IV SOSY
PREFILLED_SYRINGE | INTRAVENOUS | Status: DC | PRN
  Administered 2024-01-11: 5 mg via INTRAVENOUS

## 2024-01-11 MED ORDER — QUETIAPINE FUMARATE 50 MG PO TABS
150.0000 mg | ORAL_TABLET | Freq: Every day | ORAL | Status: DC
  Administered 2024-01-11 – 2024-01-14 (×4): 150 mg via ORAL
  Filled 2024-01-11 (×4): qty 1

## 2024-01-11 MED ORDER — FLUTICASONE PROPIONATE 50 MCG/ACT NA SUSP: 2.0000 | Freq: Every day | NASAL | Status: DC | PRN

## 2024-01-11 MED ORDER — THROMBIN 5000 UNITS EX KIT
PACK | CUTANEOUS | Status: AC
  Filled 2024-01-11: qty 1

## 2024-01-11 MED ORDER — CHLORHEXIDINE GLUCONATE CLOTH 2 % EX PADS: 6.0000 | MEDICATED_PAD | Freq: Once | CUTANEOUS | Status: DC

## 2024-01-11 MED ORDER — ORAL CARE MOUTH RINSE: 15.0000 mL | Freq: Once | OROMUCOSAL | Status: AC

## 2024-01-11 MED ORDER — HYDROMORPHONE HCL 1 MG/ML IJ SOLN
0.2500 mg | INTRAMUSCULAR | Status: DC | PRN
  Administered 2024-01-11 (×2): 0.25 mg via INTRAVENOUS
  Administered 2024-01-11 (×3): 0.5 mg via INTRAVENOUS

## 2024-01-11 MED ORDER — PROPOFOL 10 MG/ML IV BOLUS
INTRAVENOUS | Status: AC
Start: 2024-01-11 — End: 2024-01-11
  Filled 2024-01-11: qty 20

## 2024-01-11 MED ORDER — MIDAZOLAM HCL 2 MG/2ML IJ SOLN
INTRAMUSCULAR | Status: AC
  Filled 2024-01-11: qty 2

## 2024-01-11 MED ORDER — PHENYLEPHRINE 80 MCG/ML (10ML) SYRINGE FOR IV PUSH (FOR BLOOD PRESSURE SUPPORT)
PREFILLED_SYRINGE | INTRAVENOUS | Status: DC | PRN
  Administered 2024-01-11 (×2): 80 ug via INTRAVENOUS
  Administered 2024-01-11: 160 ug via INTRAVENOUS
  Administered 2024-01-11: 80 ug via INTRAVENOUS

## 2024-01-11 MED ORDER — ONDANSETRON HCL 4 MG/2ML IJ SOLN
INTRAMUSCULAR | Status: DC | PRN
  Administered 2024-01-11: 4 mg via INTRAVENOUS

## 2024-01-11 MED ORDER — INSULIN ASPART 100 UNIT/ML IJ SOLN
0.0000 [IU] | INTRAMUSCULAR | Status: DC | PRN
  Filled 2024-01-11: qty 1

## 2024-01-11 MED ORDER — DILTIAZEM HCL ER COATED BEADS 240 MG PO CP24
240.0000 mg | ORAL_CAPSULE | Freq: Every day | ORAL | Status: DC
  Administered 2024-01-11 – 2024-01-15 (×5): 240 mg via ORAL
  Filled 2024-01-11 (×5): qty 1

## 2024-01-11 MED ORDER — OXYCODONE HCL 5 MG PO TABS
ORAL_TABLET | ORAL | Status: AC
  Filled 2024-01-11: qty 1

## 2024-01-11 MED ORDER — ZONISAMIDE 100 MG PO CAPS
100.0000 mg | ORAL_CAPSULE | Freq: Two times a day (BID) | ORAL | Status: DC
  Administered 2024-01-11 – 2024-01-15 (×8): 100 mg via ORAL
  Filled 2024-01-11 (×11): qty 1

## 2024-01-11 MED ORDER — PROPOFOL 10 MG/ML IV BOLUS
INTRAVENOUS | Status: DC | PRN
  Administered 2024-01-11: 130 mg via INTRAVENOUS

## 2024-01-11 MED ORDER — CHLORHEXIDINE GLUCONATE 0.12 % MT SOLN
15.0000 mL | Freq: Once | OROMUCOSAL | Status: AC
  Administered 2024-01-11: 15 mL via OROMUCOSAL
  Filled 2024-01-11: qty 15

## 2024-01-11 MED ORDER — GABAPENTIN 300 MG PO CAPS
300.0000 mg | ORAL_CAPSULE | Freq: Two times a day (BID) | ORAL | Status: DC
  Administered 2024-01-11 – 2024-01-15 (×8): 300 mg via ORAL
  Filled 2024-01-11 (×8): qty 1

## 2024-01-11 MED ORDER — SODIUM CHLORIDE 0.9% FLUSH: 3.0000 mL | INTRAVENOUS | Status: DC | PRN

## 2024-01-11 MED ORDER — ACETAMINOPHEN 500 MG PO TABS
1000.0000 mg | ORAL_TABLET | Freq: Four times a day (QID) | ORAL | Status: AC
Start: 2024-01-12 — End: 2024-01-12
  Administered 2024-01-11 – 2024-01-12 (×2): 1000 mg via ORAL
  Filled 2024-01-11 (×3): qty 2

## 2024-01-11 MED ORDER — PHENYLEPHRINE HCL-NACL 20-0.9 MG/250ML-% IV SOLN
INTRAVENOUS | Status: DC | PRN
  Administered 2024-01-11: 40 ug/min via INTRAVENOUS

## 2024-01-11 MED ORDER — FLUTICASONE FUROATE-VILANTEROL 100-25 MCG/ACT IN AEPB
1.0000 | INHALATION_SPRAY | Freq: Every day | RESPIRATORY_TRACT | Status: DC
  Administered 2024-01-12 – 2024-01-15 (×4): 1 via RESPIRATORY_TRACT
  Filled 2024-01-11: qty 28

## 2024-01-11 MED ORDER — METFORMIN HCL 500 MG PO TABS
500.0000 mg | ORAL_TABLET | Freq: Every day | ORAL | Status: DC
  Administered 2024-01-12 – 2024-01-15 (×4): 500 mg via ORAL
  Filled 2024-01-11 (×4): qty 1

## 2024-01-11 MED ORDER — CHLORHEXIDINE GLUCONATE CLOTH 2 % EX PADS
6.0000 | MEDICATED_PAD | Freq: Once | CUTANEOUS | Status: AC
  Administered 2024-01-11: 6 via TOPICAL

## 2024-01-11 MED ORDER — LIDOCAINE 2% (20 MG/ML) 5 ML SYRINGE
INTRAMUSCULAR | Status: DC | PRN
  Administered 2024-01-11: 60 mg via INTRAVENOUS

## 2024-01-11 MED ORDER — OXYCODONE HCL 5 MG PO TABS
10.0000 mg | ORAL_TABLET | ORAL | Status: DC | PRN
  Administered 2024-01-12 – 2024-01-15 (×9): 10 mg via ORAL
  Filled 2024-01-11 (×9): qty 2

## 2024-01-11 MED ORDER — ROCURONIUM BROMIDE 10 MG/ML (PF) SYRINGE
PREFILLED_SYRINGE | INTRAVENOUS | Status: AC
  Filled 2024-01-11: qty 10

## 2024-01-11 MED ORDER — SODIUM CHLORIDE 0.9% FLUSH
3.0000 mL | Freq: Two times a day (BID) | INTRAVENOUS | Status: DC
  Administered 2024-01-11 – 2024-01-15 (×7): 3 mL via INTRAVENOUS

## 2024-01-11 MED ORDER — ROSUVASTATIN CALCIUM 5 MG PO TABS
10.0000 mg | ORAL_TABLET | Freq: Every day | ORAL | Status: DC
  Administered 2024-01-12 – 2024-01-15 (×4): 10 mg via ORAL
  Filled 2024-01-11 (×4): qty 2

## 2024-01-11 MED ORDER — ONDANSETRON HCL 4 MG/2ML IJ SOLN: 4.0000 mg | Freq: Four times a day (QID) | INTRAMUSCULAR | Status: DC | PRN

## 2024-01-11 MED ORDER — ROCURONIUM BROMIDE 10 MG/ML (PF) SYRINGE
PREFILLED_SYRINGE | INTRAVENOUS | Status: AC
Start: 2024-01-11 — End: 2024-01-11
  Filled 2024-01-11: qty 10

## 2024-01-11 MED ORDER — LIDOCAINE-EPINEPHRINE 0.5 %-1:200000 IJ SOLN
INTRAMUSCULAR | Status: DC | PRN
  Administered 2024-01-11: 15 mL

## 2024-01-11 MED ORDER — OXYCODONE HCL 5 MG/5ML PO SOLN: 5.0000 mg | Freq: Once | ORAL | Status: AC | PRN

## 2024-01-11 MED ORDER — FENTANYL CITRATE (PF) 100 MCG/2ML IJ SOLN
25.0000 ug | INTRAMUSCULAR | Status: DC | PRN
  Administered 2024-01-11 (×4): 50 ug via INTRAVENOUS

## 2024-01-11 MED ORDER — THROMBIN 20000 UNITS EX SOLR
CUTANEOUS | Status: AC
  Filled 2024-01-11: qty 20000

## 2024-01-11 MED ORDER — MENTHOL 3 MG MT LOZG: 1.0000 | LOZENGE | OROMUCOSAL | Status: DC | PRN

## 2024-01-11 MED ORDER — DEXAMETHASONE SOD PHOSPHATE PF 10 MG/ML IJ SOLN
INTRAMUSCULAR | Status: DC | PRN
  Administered 2024-01-11: 10 mg via INTRAVENOUS

## 2024-01-11 MED ORDER — SODIUM CHLORIDE 0.9 % IV SOLN
250.0000 mL | INTRAVENOUS | Status: AC
  Administered 2024-01-11 – 2024-01-12 (×2): 250 mL via INTRAVENOUS

## 2024-01-11 MED ORDER — TRAMADOL HCL 50 MG PO TABS
50.0000 mg | ORAL_TABLET | Freq: Four times a day (QID) | ORAL | Status: DC | PRN
  Administered 2024-01-11: 50 mg via ORAL
  Filled 2024-01-11: qty 1

## 2024-01-11 MED ORDER — LIDOCAINE 2% (20 MG/ML) 5 ML SYRINGE
INTRAMUSCULAR | Status: AC
  Filled 2024-01-11: qty 5

## 2024-01-11 MED ORDER — ACETAMINOPHEN 650 MG RE SUPP: 650.0000 mg | RECTAL | Status: DC | PRN

## 2024-01-11 MED ORDER — ACETAMINOPHEN 10 MG/ML IV SOLN: 1000.0000 mg | Freq: Once | INTRAVENOUS | Status: DC | PRN

## 2024-01-11 SURGICAL SUPPLY — 51 items
BAG COUNTER SPONGE SURGICOUNT (BAG) ×1 IMPLANT
BASKET BONE COLLECTION (BASKET) ×1 IMPLANT
BENZOIN TINCTURE PRP APPL 2/3 (GAUZE/BANDAGES/DRESSINGS) IMPLANT
BLADE BONE MILL MEDIUM (MISCELLANEOUS) ×1 IMPLANT
BLADE CLIPPER SURG (BLADE) IMPLANT
BUR MATCHSTICK NEURO 3.0 LAGG (BURR) ×1 IMPLANT
BUR PRECISION FLUTE 5.0 (BURR) ×1 IMPLANT
CANISTER SUCTION 3000ML PPV (SUCTIONS) ×1 IMPLANT
CNTNR URN SCR LID CUP LEK RST (MISCELLANEOUS) ×1 IMPLANT
COVER BACK TABLE 60X90IN (DRAPES) ×1 IMPLANT
DERMABOND ADVANCED .7 DNX12 (GAUZE/BANDAGES/DRESSINGS) ×1 IMPLANT
DRAPE C-ARM 42X72 X-RAY (DRAPES) ×2 IMPLANT
DRAPE C-ARMOR (DRAPES) IMPLANT
DRAPE LAPAROTOMY 100X72X124 (DRAPES) ×1 IMPLANT
DRAPE SURG 17X23 STRL (DRAPES) ×1 IMPLANT
DRSG OPSITE POSTOP 4X6 (GAUZE/BANDAGES/DRESSINGS) IMPLANT
DURAPREP 26ML APPLICATOR (WOUND CARE) ×1 IMPLANT
ELECTRODE REM PT RTRN 9FT ADLT (ELECTROSURGICAL) ×1 IMPLANT
GAUZE 4X4 16PLY ~~LOC~~+RFID DBL (SPONGE) IMPLANT
GAUZE SPONGE 4X4 12PLY STRL (GAUZE/BANDAGES/DRESSINGS) IMPLANT
GLOVE SURG LTX SZ6.5 (GLOVE) ×2 IMPLANT
GOWN STRL REUS W/ TWL LRG LVL3 (GOWN DISPOSABLE) ×2 IMPLANT
GOWN STRL REUS W/ TWL XL LVL3 (GOWN DISPOSABLE) IMPLANT
GOWN STRL REUS W/TWL 2XL LVL3 (GOWN DISPOSABLE) IMPLANT
GUIDEWIRE NITINOL BEVEL TIP (WIRE) IMPLANT
KIT BASIN OR (CUSTOM PROCEDURE TRAY) ×1 IMPLANT
KIT POSITIONER JACKSON TABLE (MISCELLANEOUS) ×1 IMPLANT
KIT TURNOVER KIT B (KITS) ×1 IMPLANT
MILL BONE PREP (MISCELLANEOUS) IMPLANT
NDL HYPO 25X1 1.5 SAFETY (NEEDLE) ×1 IMPLANT
NDL SPNL 18GX3.5 QUINCKE PK (NEEDLE) IMPLANT
NEEDLE HYPO 25X1 1.5 SAFETY (NEEDLE) ×1 IMPLANT
NEEDLE SPNL 18GX3.5 QUINCKE PK (NEEDLE) IMPLANT
PACK LAMINECTOMY NEURO (CUSTOM PROCEDURE TRAY) ×1 IMPLANT
PAD ARMBOARD POSITIONER FOAM (MISCELLANEOUS) ×2 IMPLANT
ROD RELINE MAS TI LORD 5.5X40 (Rod) IMPLANT
SCREW LOCK RELINE 5.5 TULIP (Screw) IMPLANT
SCREW RELINE RED 6.5X45MM POLY (Screw) IMPLANT
SOLN 0.9% NACL POUR BTL 1000ML (IV SOLUTION) ×1 IMPLANT
SOLN STERILE WATER BTL 1000 ML (IV SOLUTION) ×1 IMPLANT
SPIKE FLUID TRANSFER (MISCELLANEOUS) ×1 IMPLANT
SPONGE SURGIFOAM ABS GEL 100 (HEMOSTASIS) ×1 IMPLANT
SPONGE T-LAP 4X18 ~~LOC~~+RFID (SPONGE) IMPLANT
STRIP CLOSURE SKIN 1/2X4 (GAUZE/BANDAGES/DRESSINGS) IMPLANT
SUT PROLENE 6 0 BV (SUTURE) IMPLANT
SUT VIC AB 0 CT1 18XCR BRD8 (SUTURE) ×1 IMPLANT
SUT VIC AB 2-0 CT1 18 (SUTURE) ×1 IMPLANT
SUT VIC AB 3-0 SH 8-18 (SUTURE) ×1 IMPLANT
TOWEL GREEN STERILE (TOWEL DISPOSABLE) ×1 IMPLANT
TOWEL GREEN STERILE FF (TOWEL DISPOSABLE) ×1 IMPLANT
TRAY FOLEY MTR SLVR 16FR STAT (SET/KITS/TRAYS/PACK) ×1 IMPLANT

## 2024-01-11 NOTE — H&P (Signed)
 BP (!) 163/94 (BP Location: Right Arm)   Pulse 84   Temp 98.7 F (37.1 C) (Oral)   Resp 19   Ht 5' 6 (1.676 m)   Wt 125.2 kg   SpO2 96%   BMI 44.55 kg/m  [name] returns today.  She is having back pain and was told to find me after an MRI showed some minimal findings in her back.  She was in the hospital for another reason.  She had a very low hematocrit and was anemic.  She passed out while at home, and during the time she was in the hospital, she developed very severe back pain and she rates it 9/10 today.  They identified the anemia and treated her for that.  She also underwent an MRI of the lumbar spine, and as a result of that, they felt a re-evaluation by me was warranted.  This pain goes back about 3 weeks or so.  It goes back to her admission.  She states that it shoots really badly into both legs at times, and if it is hurting bad, all she can do is sit down and cry.  Legs seem to hurt worse and the back seems to also emanate right from the right flank.  There is no bowel or bladder dysfunction.  All of this started in may of 2025.  She weighs 280 pounds, blood pressure is 138/86, pulse 72, respiratory rate 16, temperature is 98.  She is alert, oriented by 4, answers all questions.  Antalgic gait.  Uses a cane in the right hand.  She moves well with normal muscle tone, bulk, and coordination.  Review of symptoms today, difficulty breathing, wheezing, lightheadedness, depression, problems with walking.  She has a history of hypertension, blood clots, hypercholesterolemia, heartburn, asthma, COPD, seizure disorder, and arthritis along with diabetes, depression, problems with walking, and unintentional weight gain. I will have Mrs. Kettles undergo a myelogram, post myelogram CT.  I am thoroughly unimpressed with the MRI as it has not changed since at least 2018.  This is not the cause of some shooting, horrible pain in the lower extremities.  She has some mild stenosis and mild foraminal narrowing.   This does not add up, so I will do the myelogram and see what we can find. The myelogram shows she has classic adjacent segment disease at 3-4.  She was fused at 4-5 and has a solid arthrodesis.  She has significant spinal stenosis present at that level.  She describes neurogenic claudication.  I have proposed and she has already agreed to undergo operative decompression posteriorly as well as anteriorly at 4-5 to decompress the canal and to arthrodese the 3-4 level.  She is alert, oriented by 4.  She answers all questions appropriately.  I spoke via FaceTime with her daughter and showed her the films and gave my rationale for the proposed operative procedure.

## 2024-01-11 NOTE — Transfer of Care (Signed)
 Immediate Anesthesia Transfer of Care Note  Patient: [name]  Procedure(s) Performed: POSTERIOR LATERAL ARTHODESIS LUMBAR THREE-FOUR, POSTERIOR NON-SEGMENTAL FIXATION LUMBAR THREE-FOUR, LUMBAR DECOMPRESSION LUMBAR THREE-FOUR (Spine Lumbar)  Patient Location: PACU  Anesthesia Type:General  Level of Consciousness: awake and sedated  Airway & Oxygen  Therapy: Patient Spontanous Breathing and Patient connected to face mask oxygen   Post-op Assessment: Report given to RN and Post -op Vital signs reviewed and stable  Post vital signs: Reviewed and stable  Last Vitals:  Vitals Value Taken Time  BP 183/103 01/11/24 16:27  Temp    Pulse 85 01/11/24 16:29  Resp 17 01/11/24 16:29  SpO2 99 % 01/11/24 16:29  Vitals shown include unfiled device data.  Last Pain:  Vitals:   01/11/24 1623  TempSrc:   PainSc: Asleep         Complications: No notable events documented.

## 2024-01-11 NOTE — Op Note (Signed)
 BP (!) 140/74 (BP Location: Left Arm)   Pulse 85   Temp 98.4 F (36.9 C)   Resp 13   Ht 5' 6 (1.676 m)   Wt 125.2 kg   SpO2 99%   BMI 44.55 kg/m  01/11/2024  5:08 PM  PATIENT:  [name]  68 y.o. female with adjacent segment disease Lumbar Three Four  PRE-OPERATIVE DIAGNOSIS:  Lumbar adjacent segment disease with spondylolisthesis  POST-OPERATIVE DIAGNOSIS:  Lumbar adjacent segment disease with spondylolisthesis  PROCEDURE:  Procedure(s):LUMBAR three laminectomy for DECOMPRESSION LUMBAR THREE-FOUR POSTERIOR LATERAL ARTHODESIS  With autograft  LUMBAR THREE-FOUR, POSTERIOR NON-SEGMENTAL percutaneous pedicle screw FIXATION albertina)  SURGEON:  Surgeon(s): Gillie Duncans, MD Debby Dorn MATSU, MD  ASSISTANTS:Thomas, Dorn  ANESTHESIA:   general  EBL:  Total I/O In: 1834.9 [I.V.:1334.9; IV Piggyback:500] Out: 275 [Urine:150; Blood:125]  BLOOD ADMINISTERED:none  CELL SAVER GIVEN:not used  COUNT:per nursing  DRAINS: Urinary Catheter (Foley)   SPECIMEN:  No Specimen  DICTATION: [name] is a 68 y.o. female whom was taken to the operating room intubated, and placed under a general anesthetic without difficulty. A foley catheter was placed under sterile conditions. She was positioned prone on a Jackson table with all pressure points properly padded.  Her lumbar region was prepped and draped in a sterile manner. I opened the skin with a 10 blade and took the incision down to the thoracolumbar fascia. I exposed the lamina of L3, and L4 in a subperiosteal fashion bilaterally. I confirmed my location with an intraoperative xray.  I placed self retaining retractors and started the decompression.  I decompressed the spinal canal with a hemilaminectomy of L3 and partial facetectomies of the L3/4 facets. I effected a decompression of the thecal sac and L3, and L4 roots I decorticated the lateral bone at L3, and L4. I then placed autograft on the decorticated  surfaces to complete the posterolateral arthrodesis.  We placed percutaneous pedicle screws at L3 and L4, using fluoroscopic guidance. I cannulated the pedicle with a Jamshidi needle at each site. A Kwire was inserted into the vertebral bodies through the Jamshidi needles. The Jamshidi needles were removed. We placed the cannulated screws over the Kwires through the pedicles into the vertebral bodies.  then tapped each pedicle, assessing each site for pedicle violations. I attached rods and locking caps with the appropriate tools. The locking caps were secured with torque limited screwdrivers. Final films were performed and the final construct appeared to be in good position.  I closed the wound in a layered fashion. I approximated the thoracolumbar fascia, subcutaneous, and subcuticular planes with vicryl sutures. I used Dermabond and an occlusive bandage for a sterile dressing.     PLAN OF CARE: Admit to inpatient   PATIENT DISPOSITION:  PACU - hemodynamically stable.   Delay start of Pharmacological VTE agent (>24hrs) due to surgical blood loss or risk of bleeding:  yes

## 2024-01-11 NOTE — Anesthesia Procedure Notes (Signed)
 Procedure Name: Intubation Date/Time: 01/11/2024 12:28 PM  Performed by: Etana Beets C, CRNAPre-anesthesia Checklist: Patient identified, Emergency Drugs available, Suction available and Patient being monitored Patient Re-evaluated:Patient Re-evaluated prior to induction Oxygen  Delivery Method: Circle system utilized Preoxygenation: Pre-oxygenation with 100% oxygen  Induction Type: IV induction Ventilation: Mask ventilation without difficulty Laryngoscope Size: Mac and 4 Grade View: Grade II Tube type: Oral Tube size: 7.0 mm Number of attempts: 1 Airway Equipment and Method: Stylet and Oral airway Placement Confirmation: ETT inserted through vocal cords under direct vision, positive ETCO2 and breath sounds checked- equal and bilateral Secured at: 22 cm Tube secured with: Tape Dental Injury: Teeth and Oropharynx as per pre-operative assessment

## 2024-01-12 ENCOUNTER — Other Ambulatory Visit: Payer: Self-pay

## 2024-01-12 DIAGNOSIS — M48062 Spinal stenosis, lumbar region with neurogenic claudication: Secondary | ICD-10-CM | POA: Diagnosis not present

## 2024-01-12 DIAGNOSIS — I110 Hypertensive heart disease with heart failure: Secondary | ICD-10-CM | POA: Diagnosis not present

## 2024-01-12 DIAGNOSIS — E119 Type 2 diabetes mellitus without complications: Secondary | ICD-10-CM | POA: Diagnosis not present

## 2024-01-12 DIAGNOSIS — M4316 Spondylolisthesis, lumbar region: Secondary | ICD-10-CM | POA: Diagnosis not present

## 2024-01-12 DIAGNOSIS — E039 Hypothyroidism, unspecified: Secondary | ICD-10-CM | POA: Diagnosis not present

## 2024-01-12 DIAGNOSIS — I509 Heart failure, unspecified: Secondary | ICD-10-CM | POA: Diagnosis not present

## 2024-01-12 DIAGNOSIS — J449 Chronic obstructive pulmonary disease, unspecified: Secondary | ICD-10-CM | POA: Diagnosis not present

## 2024-01-12 NOTE — Progress Notes (Signed)
    Providing Compassionate, Quality Care - Together   NEUROSURGERY PROGRESS NOTE     S: No issues overnight. Ambulated with therapies.    O: EXAM:  BP 114/76 (BP Location: Left Arm)   Pulse 90   Temp (!) 97.4 F (36.3 C) (Oral)   Resp 16   Ht 5' 6 (1.676 m)   Wt 125.2 kg   SpO2 97%   BMI 44.55 kg/m     Awake, alert, oriented  Speech fluent, appropriate  Sitting in bedside chair Dressing c/d/I. Was just changed per patient.    ASSESSMENT:  68 y.o. s/p L3-4 PSF    PLAN: -Continue therapies as tolerated -Continue supportive care   Camie Pickle, [facility]

## 2024-01-12 NOTE — Plan of Care (Signed)
  Problem: Education: Goal: Knowledge of General Education information will improve Description: Including pain rating scale, medication(s)/side effects and non-pharmacologic comfort measures Outcome: Progressing   Problem: Health Behavior/Discharge Planning: Goal: Ability to manage health-related needs will improve Outcome: Progressing   Problem: Clinical Measurements: Goal: Ability to maintain clinical measurements within normal limits will improve Outcome: Progressing Goal: Will remain free from infection Outcome: Progressing Goal: Diagnostic test results will improve Outcome: Progressing Goal: Respiratory complications will improve Outcome: Progressing Goal: Cardiovascular complication will be avoided Outcome: Progressing   Problem: Activity: Goal: Risk for activity intolerance will decrease Outcome: Progressing   Problem: Nutrition: Goal: Adequate nutrition will be maintained Outcome: Progressing   Problem: Coping: Goal: Level of anxiety will decrease Outcome: Progressing   Problem: Elimination: Goal: Will not experience complications related to bowel motility Outcome: Progressing Goal: Will not experience complications related to urinary retention Outcome: Progressing   Problem: Pain Managment: Goal: General experience of comfort will improve and/or be controlled Outcome: Progressing   Problem: Safety: Goal: Ability to remain free from injury will improve Outcome: Progressing   Problem: Skin Integrity: Goal: Risk for impaired skin integrity will decrease Outcome: Progressing   Problem: Education: Goal: Ability to verbalize activity precautions or restrictions will improve Outcome: Progressing Goal: Knowledge of the prescribed therapeutic regimen will improve Outcome: Progressing Goal: Understanding of discharge needs will improve Outcome: Progressing   Problem: Activity: Goal: Ability to avoid complications of mobility impairment will  improve Outcome: Progressing Goal: Ability to tolerate increased activity will improve Outcome: Progressing Goal: Will remain free from falls Outcome: Progressing   Problem: Bowel/Gastric: Goal: Gastrointestinal status for postoperative course will improve Outcome: Progressing   Problem: Clinical Measurements: Goal: Ability to maintain clinical measurements within normal limits will improve Outcome: Progressing Goal: Postoperative complications will be avoided or minimized Outcome: Progressing Goal: Diagnostic test results will improve Outcome: Progressing   Problem: Pain Management: Goal: Pain level will decrease Outcome: Progressing   Problem: Skin Integrity: Goal: Will show signs of wound healing Outcome: Progressing   Problem: Health Behavior/Discharge Planning: Goal: Identification of resources available to assist in meeting health care needs will improve Outcome: Progressing   Problem: Bladder/Genitourinary: Goal: Urinary functional status for postoperative course will improve Outcome: Progressing

## 2024-01-12 NOTE — Progress Notes (Signed)
 OT Cancellation Note  Patient Details Name: [name] MRN: [id number] DOB: 13-Jan-1956   Cancelled Treatment:    Reason Eval/Treat Not Completed: Fatigue/lethargy limiting ability to participate.  Patient with increased pain and fatigued from family visit. Will plan to see on Sunday as schedule allows   Inocente SHAUNNA Browner 01/12/2024, 3:17 PM Inocente Browner OTR/L

## 2024-01-12 NOTE — Plan of Care (Signed)
  Problem: Pain Managment: Gutterman: General experience of comfort will improve and/or be controlled Outcome: Progressing   Problem: Safety: Erion: Ability to remain free from injury will improve Outcome: Progressing   Problem: Skin Integrity: Feenstra: Risk for impaired skin integrity will decrease Outcome: Progressing   Problem: Education: [place]: Ability to verbalize activity precautions or restrictions will improve Outcome: Progressing [place]ula: Knowledge of the prescribed therapeutic regimen will improve Outcome: Progressing [place]: Understanding of discharge needs will improve Outcome: Progressing   Problem: Activity: Ludvigsen: Ability to avoid complications of mobility impairment will improve Outcome: Progressing [place]: Ability to tolerate increased activity will improve Outcome: Progressing Simmerman: Will remain free from falls Outcome: Progressing   Problem: Bladder/Genitourinary: [place]: Urinary functional status for postoperative course will improve Outcome: Progressing

## 2024-01-12 NOTE — Evaluation (Signed)
 Physical Therapy Evaluation Patient Details Name: [name] MRN: [id number] DOB: November 03, 1955 Today's Date: 01/12/2024  History of Present Illness  68 yo female s/p L3-4 PLIF 10/24. PMH includes hypertension, blood clots, hypercholesterolemia, heartburn, asthma, COPD, seizure disorder, L4-5 fusion 2013, bilat TKR 2010, fibromyalgia,  and arthritis.  Clinical Impression  Pt presents with post-op back pain, impaired balance, generalized weakness, antalgic gait, and decreased activity tolerance vs anticipated baseline. Pt to benefit from acute PT to address deficits. Pt ambulated short room distance with standing rest as needed, overall requiring close guard to light physical assist and very limited by back pain. PT anticipates good progress with continued pain control, pt lives alone at baseline and would recommend increased support from family at d/c. PT to progress mobility as tolerated, and will continue to follow acutely.          If plan is discharge home, recommend the following: A little help with walking and/or transfers;A little help with bathing/dressing/bathroom   Can travel by private vehicle        Equipment Recommendations Rolling walker (2 wheels);None recommended by PT  Recommendations for Other Services       Functional Status Assessment Patient has had a recent decline in their functional status and demonstrates the ability to make significant improvements in function in a reasonable and predictable amount of time.     Precautions / Restrictions Precautions Precautions: Fall;Back Precaution Booklet Issued: Yes (comment) Precaution/Restrictions Comments: per orders no brace needed Restrictions Weight Bearing Restrictions Per Provider Order: No      Mobility  Bed Mobility Overal bed mobility: Needs Assistance Bed Mobility: Rolling, Sidelying to Sit Rolling: Min assist Sidelying to sit: Min assist       General bed mobility comments: assist for LE  progression, log roll technique, and trunk boost    Transfers Overall transfer level: Needs assistance Equipment used: Rolling walker (2 wheels) Transfers: Sit to/from Stand Sit to Stand: Contact guard assist           General transfer comment: safety, cues for hand placement, stand x2 from EOB and BSC.    Ambulation/Gait Ambulation/Gait assistance: Contact guard assist Gait Distance (Feet): 15 Feet Assistive device: Rolling walker (2 wheels) Gait Pattern/deviations: Step-through pattern, Decreased stride length, Trunk flexed Gait velocity: decr     General Gait Details: close guard for safety, cues for upright posture and placement in RW  Stairs            Wheelchair Mobility     Tilt Bed    Modified Rankin (Stroke Patients Only)       Balance Overall balance assessment: Needs assistance, History of Falls Sitting-balance support: No upper extremity supported, Feet supported Sitting balance-Leahy Scale: Fair     Standing balance support: Bilateral upper extremity supported, During functional activity, Reliant on assistive device for balance Standing balance-Leahy Scale: Poor                               Pertinent Vitals/Pain Pain Assessment Pain Assessment: 0-10 Pain Score: 8  Pain Location: back Pain Descriptors / Indicators: Sore, Grimacing, Guarding Pain Intervention(s): Monitored during session, Limited activity within patient's tolerance, Repositioned, RN gave pain meds during session    Home Living Family/patient expects to be discharged to:: Private residence Living Arrangements: Alone Available Help at Discharge: Personal care attendant;Available PRN/intermittently;Friend(s) Type of Home: Apartment Home Access: Level entry;Elevator       Home Layout:  One level Home Equipment: Educational Psychologist (4 wheels);BSC/3in1;Cane - single point;Grab bars - tub/shower;Grab bars - toilet Additional Comments: Pt reports having a PCA  Monday-Friday 12:15-3:15pm, Saturday 8-10am, Sunday 8-9:30am who assists with bathing & cleaning.    Prior Function Prior Level of Function : Needs assist             Mobility Comments: Ambulates with rollator, remote history of falls ADLs Comments: aide helps with meals, also has meals on wheels     Extremity/Trunk Assessment   Upper Extremity Assessment Upper Extremity Assessment: Defer to OT evaluation    Lower Extremity Assessment Lower Extremity Assessment: Generalized weakness;RLE deficits/detail RLE Deficits / Details: endorsing decreased sensation L3 distribution, otherwise intact    Cervical / Trunk Assessment Cervical / Trunk Assessment: Back Surgery  Communication   Communication Communication: No apparent difficulties    Cognition Arousal: Alert Behavior During Therapy: WFL for tasks assessed/performed   PT - Cognitive impairments: No apparent impairments                                 Cueing       General Comments General comments (skin integrity, edema, etc.): bleeding from incision site noted in bed linens and during mobility, RN informed and coming to room to address    Exercises     Assessment/Plan    PT Assessment Patient needs continued PT services  PT Problem List Decreased strength;Decreased mobility;Decreased activity tolerance;Decreased balance;Decreased knowledge of use of DME;Pain;Decreased knowledge of precautions;Decreased safety awareness       PT Treatment Interventions DME instruction;Therapeutic activities;Gait training;Therapeutic exercise;Patient/family education;Balance training;Functional mobility training;Neuromuscular re-education    PT Goals (Current goals can be found in the Care Plan section)  Acute Rehab PT Goals Patient Stated Sharples: home with family help PT Dugger Formulation: With patient Time For Stell Achievement: 01/26/24 Potential to Achieve Goals: Good    Frequency Min 5X/week      Co-evaluation               AM-PAC PT 6 Clicks Mobility  Outcome Measure Help needed turning from your back to your side while in a flat bed without using bedrails?: A Little Help needed moving from lying on your back to sitting on the side of a flat bed without using bedrails?: A Little Help needed moving to and from a bed to a chair (including a wheelchair)?: A Little Help needed standing up from a chair using your arms (e.g., wheelchair or bedside chair)?: A Little Help needed to walk in hospital room?: A Little Help needed climbing 3-5 steps with a railing? : A Little 6 Click Score: 18    End of Session   Activity Tolerance: Patient tolerated treatment well;Patient limited by fatigue Patient left: in chair;with call bell/phone within reach;with chair alarm set Nurse Communication: Mobility status;Other (comment) (incision dressing needs to be assessed) PT Visit Diagnosis: Other abnormalities of gait and mobility (R26.89);Muscle weakness (generalized) (M62.81)    Time: 9084-9054 PT Time Calculation (min) (ACUTE ONLY): 30 min   Charges:   PT Evaluation $PT Eval Low Complexity: 1 Low PT Treatments $Therapeutic Activity: 8-22 mins PT General Charges $$ ACUTE PT VISIT: 1 Visit         Johana RAMAN, PT DPT Acute Rehabilitation Services Secure Chat Preferred  Office [phone]   Niharika Savino E Stroup 01/12/2024, 11:19 AM

## 2024-01-13 DIAGNOSIS — E039 Hypothyroidism, unspecified: Secondary | ICD-10-CM | POA: Diagnosis not present

## 2024-01-13 DIAGNOSIS — I110 Hypertensive heart disease with heart failure: Secondary | ICD-10-CM | POA: Diagnosis not present

## 2024-01-13 DIAGNOSIS — E119 Type 2 diabetes mellitus without complications: Secondary | ICD-10-CM | POA: Diagnosis not present

## 2024-01-13 DIAGNOSIS — M48062 Spinal stenosis, lumbar region with neurogenic claudication: Secondary | ICD-10-CM | POA: Diagnosis not present

## 2024-01-13 DIAGNOSIS — M4316 Spondylolisthesis, lumbar region: Secondary | ICD-10-CM | POA: Diagnosis not present

## 2024-01-13 DIAGNOSIS — I509 Heart failure, unspecified: Secondary | ICD-10-CM | POA: Diagnosis not present

## 2024-01-13 DIAGNOSIS — J449 Chronic obstructive pulmonary disease, unspecified: Secondary | ICD-10-CM | POA: Diagnosis not present

## 2024-01-13 NOTE — Evaluation (Signed)
 Occupational Therapy Evaluation Patient Details Name: [name] MRN: [id number] DOB: 1955-07-11 Today's Date: 01/13/2024   History of Present Illness   68 yo female s/p L3-4 PLIF 10/24. PMH includes hypertension, blood clots, hypercholesterolemia, heartburn, asthma, COPD, seizure disorder, L4-5 fusion 2013, bilat TKR 2010, fibromyalgia,  and arthritis.     Clinical Impressions Patient lives alone in a ground level apartment and has a aid that helps throughout the week to complete IADLs and help her with ADLs.  Patient completed functional mobility with Rolator with mod I.  Patient currently requires min A for sit to supine along with verbal cues for back precautions and CGA for functional mobility.  Max A for LB ADLs at this time 2/2 back precautions.  Patient would benefit from additional OT intervention to address functional deficits of LB ADLs, activity tolerance, fall prevention in order for patient to return to PLOF.  Patient would benefit from [facility] once DC from hospital.     If plan is discharge home, recommend the following:   A little help with walking and/or transfers;Assistance with cooking/housework;Assist for transportation;Help with stairs or ramp for entrance;A little help with bathing/dressing/bathroom     Functional Status Assessment   Patient has had a recent decline in their functional status and demonstrates the ability to make significant improvements in function in a reasonable and predictable amount of time.     Equipment Recommendations   None recommended by OT     Recommendations for Other Services         Precautions/Restrictions   Precautions Precautions: Fall;Back Precaution Booklet Issued: Yes (comment) Recall of Precautions/Restrictions: Intact Precaution/Restrictions Comments: per orders no brace needed Restrictions Weight Bearing Restrictions Per Provider Order: No     Mobility Bed Mobility Overal bed mobility: Needs  Assistance Bed Mobility: Sidelying to Sit, Sit to Sidelying Rolling: Min assist       Sit to sidelying: Min assist      Transfers Overall transfer level: Needs assistance Equipment used: Rollator (4 wheels) Transfers: Sit to/from Stand Sit to Stand: Min assist                  Balance Overall balance assessment: Needs assistance, History of Falls Sitting-balance support: No upper extremity supported, Feet supported           Standing balance comment: Reliant on RW for static balance.                           ADL either performed or assessed with clinical judgement   ADL Overall ADL's : Needs assistance/impaired Eating/Feeding: Independent;Sitting   Grooming: Wash/dry hands;Wash/dry face;Oral care;Standing;Contact guard assist   Upper Body Bathing: Minimal assistance;Sitting   Lower Body Bathing: Moderate assistance   Upper Body Dressing : Minimal assistance;Sitting   Lower Body Dressing: Maximal assistance   Toilet Transfer: Contact guard assist   Toileting- Clothing Manipulation and Hygiene: Moderate assistance       Functional mobility during ADLs: Contact guard assist       Vision Patient Visual Report: No change from baseline       Perception         Praxis         Pertinent Vitals/Pain Pain Assessment Pain Assessment: 0-10 Pain Score: 5  Pain Location: back Pain Descriptors / Indicators: Sore, Grimacing, Discomfort Pain Intervention(s): Monitored during session     Extremity/Trunk Assessment Upper Extremity Assessment Upper Extremity Assessment: Overall WFL for tasks assessed  Lower Extremity Assessment Lower Extremity Assessment: Defer to PT evaluation   Cervical / Trunk Assessment Cervical / Trunk Assessment: Back Surgery   Communication Communication Communication: No apparent difficulties   Cognition Arousal: Alert Behavior During Therapy: WFL for tasks assessed/performed Cognition: No apparent  impairments                               Following commands: Intact       Cueing  General Comments   Cueing Techniques: Verbal cues;Gestural cues  Pt's 8/10 pain levels remained steady throughout session, even with transfers and ambulation.   Exercises     Shoulder Instructions      Home Living Family/patient expects to be discharged to:: Private residence Living Arrangements: Alone Available Help at Discharge: Personal care attendant;Available PRN/intermittently;Friend(s) Type of Home: Apartment Home Access: Level entry;Elevator     Home Layout: One level     Bathroom Shower/Tub: Chief Strategy Officer: Handicapped height Bathroom Accessibility: No   Home Equipment: Educational Psychologist (4 wheels);BSC/3in1;Cane - single point;Grab bars - tub/shower;Grab bars - toilet   Additional Comments: Pt reports having a PCA Monday-Friday 12:15-3:15pm, Saturday 8-10am, Sunday 8-9:30am who assists with bathing & cleaning.      Prior Functioning/Environment Prior Level of Function : Needs assist       Physical Assist : ADLs (physical)   ADLs (physical): Bathing;IADLs Mobility Comments: Ambulates with rollator, remote history of falls ADLs Comments: aide helps with meals, also has meals on wheels    OT Problem List: Decreased strength;Decreased activity tolerance;Decreased safety awareness   OT Treatment/Interventions: Self-care/ADL training;Therapeutic exercise;DME and/or AE instruction;Therapeutic activities      OT Goals(Current goals can be found in the care plan section)   Acute Rehab OT Goals OT [place] Formulation: With patient Time For [place] Achievement: 01/27/24 Potential to Achieve Goals: Good ADL Goals Pt Will Perform Lower Body Bathing: with adaptive equipment;with min assist Pt Will Perform Lower Body Dressing: with adaptive equipment;with min assist Pt Will Transfer to Toilet: with modified independence Pt Will Perform Toileting  - Clothing Manipulation and hygiene: with contact guard assist Pt Will Perform Tub/Shower Transfer: with min assist   OT Frequency:  Min 2X/week    Co-evaluation              AM-PAC OT 6 Clicks Daily Activity     Outcome Measure Help from another person eating meals?: None Help from another person taking care of personal grooming?: A Little Help from another person toileting, which includes using toliet, bedpan, or urinal?: A Lot Help from another person bathing (including washing, rinsing, drying)?: A Lot Help from another person to put on and taking off regular upper body clothing?: A Little Help from another person to put on and taking off regular lower body clothing?: A Lot 6 Click Score: 16   End of Session Equipment Utilized During Treatment: Rolling walker (2 wheels) Nurse Communication: Mobility status  Activity Tolerance: Patient tolerated treatment well Patient left: in bed;with call bell/phone within reach  OT Visit Diagnosis: Unsteadiness on feet (R26.81);Muscle weakness (generalized) (M62.81)                Time: 8952-8894 OT Time Calculation (min): 18 min Charges:  OT General Charges $OT Visit: 1 Visit OT Evaluation $OT Eval Moderate Complexity: 1 Mod  Lamarr Pouch OT/L  Lamarr JONETTA Pouch 01/13/2024, 5:18 PM

## 2024-01-13 NOTE — Progress Notes (Signed)
 Subjective: Patient reports some back pain  Objective: Vital signs in last 24 hours: Temp:  [97.7 F (36.5 C)-98.4 F (36.9 C)] 97.8 F (36.6 C) (10/26 1111) Pulse Rate:  [77-90] 82 (10/26 1111) Resp:  [18-20] 20 (10/26 1111) BP: (130-157)/(54-75) 157/75 (10/26 1111) SpO2:  [96 %-99 %] 98 % (10/26 1111)  Intake/Output from previous day: 10/25 0701 - 10/26 0700 In: 1083 [P.O.:1080; I.V.:3] Out: 2000 [Urine:2000] Intake/Output this shift: No intake/output data recorded.  Nad Walking with walker with assist from OT Incisions c/d, no drainage currently. Dermabond in place.  Dressing redressed  Lab Results: No results for input(s): WBC, HGB, HCT, PLT in the last 72 hours. BMET No results for input(s): NA, K, CL, CO2, GLUCOSE, BUN, CREATININE, CALCIUM  in the last 72 hours.  Studies/Results: DG Lumbar Spine 2-3 Views Result Date: 01/11/2024 CLINICAL DATA:  Elective surgery. EXAM: LUMBAR SPINE - 2-3 VIEW COMPARISON:  CT 11/23/2023 FINDINGS: Four fluoroscopic spot views of the lumbar spine submitted from the operating room. Previous anterior L4-L5 fusion. New posterior rod and pedicle screw fixation at L3-L4. Fluoroscopy time 2 minutes 28 seconds. Dose 184.59 mGy. IMPRESSION: Intraoperative fluoroscopy during lumbar spine surgery. Electronically Signed   By: Andrea Gasman M.D.   On: 01/11/2024 16:49   DG C-Arm 1-60 Min-No Report Result Date: 01/11/2024 Fluoroscopy was utilized by the requesting physician.  No radiographic interpretation.   DG C-Arm 1-60 Min-No Report Result Date: 01/11/2024 Fluoroscopy was utilized by the requesting physician.  No radiographic interpretation.   DG C-Arm 1-60 Min-No Report Result Date: 01/11/2024 Fluoroscopy was utilized by the requesting physician.  No radiographic interpretation.    Assessment/Plan: S/p L3-4 posterior decompression and fusion - continue PT, OT - patient thinks she will be ready for home  tomorrow   Dorn KANDICE Ned 01/13/2024, 11:31 AM

## 2024-01-13 NOTE — Progress Notes (Signed)
 Physical Therapy Treatment Patient Details Name: [name] MRN: [id number] DOB: August 23, 1955 Today's Date: 01/13/2024   History of Present Illness 68 yo female s/p L3-4 PLIF 10/24. PMH includes hypertension, blood clots, hypercholesterolemia, heartburn, asthma, COPD, seizure disorder, L4-5 fusion 2013, bilat TKR 2010, fibromyalgia,  and arthritis.    PT Comments  Pt received sidelying in bed and agreeable to PT session. Pt CGA with no physical assist for sidelying>sit transfer today and required min assist to power up into standing. Utilized rollator today as this is pt's baseline. Verbal cues used for correct usage of rollator brakes prior to ambulation and before sitting down. Able to ambulate 66 feet with CGA and close chair follow. Pt reported some lightheadedness with initial sit>stand transfer, which decreased with a seated rest break. She also felt some lightheadedness with ambulation, limiting distance walked today. Throughout session, pt's low back pain remained at 8/10. Continuing to recommend home at d/c with increased family assistance as needed. Acute PT to follow.    If plan is discharge home, recommend the following: A little help with walking and/or transfers;A little help with bathing/dressing/bathroom   Can travel by private vehicle        Equipment Recommendations  None recommended by PT;Wheelchair (measurements PT);Wheelchair cushion (measurements PT)    Recommendations for Other Services       Precautions / Restrictions Precautions Precautions: Fall;Back Precaution Booklet Issued: Yes (comment) Recall of Precautions/Restrictions: Intact Precaution/Restrictions Comments: per orders no brace needed Restrictions Weight Bearing Restrictions Per Provider Order: No     Mobility  Bed Mobility Overal bed mobility: Needs Assistance Bed Mobility: Sidelying to Sit   Sidelying to sit: Contact guard assist       General bed mobility comments: Pt received in  sidelying. No physical assist needed for sidelying>sit, just CGA.    Transfers Overall transfer level: Needs assistance Equipment used: Rollator (4 wheels) Transfers: Sit to/from Stand Sit to Stand: Min assist           General transfer comment: Required min assist to power up to standing. Utilized rollator for transfer and gait as this is what pt's baseline is. During first sit to stand, pt noted lightheadedness and requested to sit back down. After a short seated rest break, able to perform sit>stand again and begin ambulation.    Ambulation/Gait Ambulation/Gait assistance: Contact guard assist Gait Distance (Feet): 66 Feet Assistive device: Rollator (4 wheels) Gait Pattern/deviations: Step-through pattern, Decreased stride length, Trunk flexed Gait velocity: decr Gait velocity interpretation: <1.31 ft/sec, indicative of household ambulator   General Gait Details: CGA for safety plus chair follow. Pt recognized fatigue and reported lightheadedness and able to ambulate back to room.   Stairs             Wheelchair Mobility     Tilt Bed    Modified Rankin (Stroke Patients Only)       Balance Overall balance assessment: Needs assistance, History of Falls Sitting-balance support: No upper extremity supported, Feet supported Sitting balance-Leahy Scale: Fair Sitting balance - Comments: Able to perform anterior scoots in bed so that feet touched the floor without physical assist.   Standing balance support: Bilateral upper extremity supported, During functional activity, Reliant on assistive device for balance Standing balance-Leahy Scale: Poor Standing balance comment: Reliant on RW for static balance.                            Communication Communication Communication: No  apparent difficulties  Cognition Arousal: Alert Behavior During Therapy: WFL for tasks assessed/performed   PT - Cognitive impairments: No apparent impairments                        PT - Cognition Comments: Appeared alert and oriented, able to follow commands consistently. Following commands: Intact      Cueing Cueing Techniques: Verbal cues, Gestural cues  Exercises      General Comments General comments (skin integrity, edema, etc.): Pt's 8/10 pain levels remained steady throughout session, even with transfers and ambulation.      Pertinent Vitals/Pain Pain Assessment Pain Assessment: 0-10 Pain Score: 8  Pain Location: back Pain Descriptors / Indicators: Sore, Grimacing, Discomfort Pain Intervention(s): Limited activity within patient's tolerance, Monitored during session    Home Living                          Prior Function            PT Goals (current goals can now be found in the care plan section) Acute Rehab PT Goals Patient Stated [place]: home with family help PT [place] Formulation: With patient Time For [place] Achievement: 01/26/24 Potential to Achieve Goals: Good Progress towards PT goals: Progressing toward goals    Frequency    Min 5X/week      PT Plan      Co-evaluation              AM-PAC PT 6 Clicks Mobility   Outcome Measure  Help needed turning from your back to your side while in a flat bed without using bedrails?: A Little Help needed moving from lying on your back to sitting on the side of a flat bed without using bedrails?: A Little Help needed moving to and from a bed to a chair (including a wheelchair)?: A Little Help needed standing up from a chair using your arms (e.g., wheelchair or bedside chair)?: A Little Help needed to walk in hospital room?: A Little Help needed climbing 3-5 steps with a railing? : A Little 6 Click Score: 18    End of Session Equipment Utilized During Treatment: Gait belt Activity Tolerance: Patient tolerated treatment well;Patient limited by fatigue Patient left: in bed;with call bell/phone within reach   PT Visit Diagnosis: Other abnormalities of gait  and mobility (R26.89);Muscle weakness (generalized) (M62.81)     Time: 0215-0235 PT Time Calculation (min) (ACUTE ONLY): 20 min  Charges:                            Cailean Heacock, SPT    Riham Polyakov 01/13/2024, 2:51 PM

## 2024-01-14 DIAGNOSIS — J449 Chronic obstructive pulmonary disease, unspecified: Secondary | ICD-10-CM | POA: Diagnosis not present

## 2024-01-14 DIAGNOSIS — E039 Hypothyroidism, unspecified: Secondary | ICD-10-CM | POA: Diagnosis not present

## 2024-01-14 DIAGNOSIS — E119 Type 2 diabetes mellitus without complications: Secondary | ICD-10-CM | POA: Diagnosis not present

## 2024-01-14 DIAGNOSIS — M48062 Spinal stenosis, lumbar region with neurogenic claudication: Secondary | ICD-10-CM | POA: Diagnosis not present

## 2024-01-14 DIAGNOSIS — I509 Heart failure, unspecified: Secondary | ICD-10-CM | POA: Diagnosis not present

## 2024-01-14 DIAGNOSIS — M4316 Spondylolisthesis, lumbar region: Secondary | ICD-10-CM | POA: Diagnosis not present

## 2024-01-14 DIAGNOSIS — I110 Hypertensive heart disease with heart failure: Secondary | ICD-10-CM | POA: Diagnosis not present

## 2024-01-14 LAB — GLUCOSE, CAPILLARY: Glucose-Capillary: 167 mg/dL — ABNORMAL HIGH (ref 70–99)

## 2024-01-14 MED ORDER — TIZANIDINE HCL 4 MG PO TABS: 4.0000 mg | ORAL_TABLET | Freq: Four times a day (QID) | ORAL | 0 refills | Status: DC | PRN

## 2024-01-14 MED ORDER — OXYCODONE HCL 5 MG PO TABS: 5.0000 mg | ORAL_TABLET | ORAL | 0 refills | Status: DC | PRN

## 2024-01-14 MED ORDER — LINACLOTIDE 145 MCG PO CAPS
145.0000 ug | ORAL_CAPSULE | ORAL | Status: DC
  Administered 2024-01-14: 145 ug via ORAL
  Filled 2024-01-14 (×2): qty 1

## 2024-01-14 NOTE — Discharge Instructions (Signed)
 Spinal Fusion Care After Refer to this sheet in the next few weeks. These instructions provide you with information on caring for yourself after your procedure. Your caregiver may also give you more specific instructions. Your treatment has been planned according to current medical practices, but problems sometimes occur. Call your caregiver if you have any problems or questions after your procedure. HOME CARE INSTRUCTIONS   Take whatever pain medicine has been prescribed by your caregiver. Do not take over-the-counter pain medicine unless directed otherwise by your caregiver.   Do not drive if you are taking narcotic pain medicines.   Change your bandage (dressing) if necessary or as directed by your caregiver.   You may shower. The wound may get wet, simply pat the area dry. It will take ~2 weeks for the glue to peel off.  If you have been prescribed medicine to prevent your blood from clotting, follow the directions carefully.   Check the area around your incision often. Look for redness and swelling. Also, look for anything leaking from your wound. You can use a mirror or have a family member inspect your incision if it is in a place where it is difficult for you to see.   Ask your caregiver what activities you should avoid and for how long.   Walk as much as possible.   Do not lift anything heavier than 5 lbs until your caregiver says it is safe.   Do not twist or bend for a few weeks. Try not to pull on things. Avoid sitting for long periods of time. Change positions at least every hour.

## 2024-01-14 NOTE — Care Management Obs Status (Signed)
 MEDICARE OBSERVATION STATUS NOTIFICATION   Patient Details  Name: Sonam Wandel Study MRN: [id number] Date of Birth: 1955/07/11   Medicare Observation Status Notification Given:  Yes Verbally reviewed observation notice with [facility]  telephonically at [phone].  Will mail a copy to the patient home address.   Allysia Ingles 01/14/2024, 11:03 AM

## 2024-01-14 NOTE — Progress Notes (Signed)
 Patient ID: [name], female   DOB: May 21, 1955, 68 y.o.   MRN: [id number] BP (!) 154/67 (BP Location: Left Arm)   Pulse 86   Temp 98.1 F (36.7 C) (Oral)   Resp 18   Ht 5' 6 (1.676 m)   Wt 125.2 kg   SpO2 99%   BMI 44.55 kg/m  Alert and oriented x 4, speech is clear and fluent Moving all extremities well Wound dressing dry, and intact.  Home health arranged Discharge tomorrow

## 2024-01-14 NOTE — Progress Notes (Signed)
 Occupational Therapy Treatment Patient Details Name: [name] MRN: [id number] DOB: [birth year] Today's Date: 01/14/2024   History of present illness 68 yo female s/p L3-4 PLIF 10/24. PMH includes hypertension, blood clots, hypercholesterolemia, heartburn, asthma, COPD, seizure disorder, L4-5 fusion 2013, bilat TKR 2010, fibromyalgia,  and arthritis.   OT comments  Pt progressing toward established OT goals. Pt limited by pain this session, stating she walked in hall and stayed in chair a good amount of time today. Focus session on LB ADL with intro of AE to optimize ability to perform within precautions. Pt needing min A for LB ADL with AE and up to mod A for STS transfers with bed in lowest position this session. Will continue to follow acutely. Pt reports daughter or neighbor can come assist her as needed as well when she gets home in addition to her aide.       If plan is discharge home, recommend the following:  A little help with walking and/or transfers;Assistance with cooking/housework;Assist for transportation;Help with stairs or ramp for entrance;A little help with bathing/dressing/bathroom   Equipment Recommendations  None recommended by OT    Recommendations for Other Services      Precautions / Restrictions Precautions Precautions: Fall;Back Precaution Booklet Issued: Yes (comment) Recall of Precautions/Restrictions: Intact Precaution/Restrictions Comments: per orders no brace needed Restrictions Weight Bearing Restrictions Per Provider Order: No       Mobility Bed Mobility Overal bed mobility: Needs Assistance Bed Mobility: Sidelying to Sit, Sit to Sidelying, Rolling Rolling: Contact guard assist Sidelying to sit: Contact guard assist     Sit to sidelying: Min assist General bed mobility comments: increased time to come to EOB with use of bed rail. Min A to bring LEs back into bed    Transfers Overall transfer level: Needs assistance Equipment used:  Rollator (4 wheels) Transfers: Sit to/from Stand Sit to Stand: Mod assist, Min assist           General transfer comment: Required mod assist and increased time to power up to stand today.     Balance Overall balance assessment: Needs assistance, History of Falls Sitting-balance support: No upper extremity supported, Feet supported Sitting balance-Leahy Scale: Fair Sitting balance - Comments: Pt required minA to scoot hips forward to sit fully upright. Pt attempted to reach for hand rail to help pull self more upright, but could not reach to assist.   Standing balance support: Bilateral upper extremity supported, During functional activity, Reliant on assistive device for balance Standing balance-Leahy Scale: Poor Standing balance comment: Reliant on RW for static balance.                           ADL either performed or assessed with clinical judgement   ADL Overall ADL's : Needs assistance/impaired                     Lower Body Dressing: With adaptive equipment;Sit to/from stand;Minimal assistance Lower Body Dressing Details (indicate cue type and reason): for socks Toilet Transfer: Contact guard assist;Rolling walker (2 wheels)           Functional mobility during ADLs: Contact guard assist;Rolling walker (2 wheels)      Extremity/Trunk Assessment Upper Extremity Assessment Upper Extremity Assessment: Overall WFL for tasks assessed   Lower Extremity Assessment Lower Extremity Assessment: Defer to PT evaluation        Vision       Perception  Praxis     Communication Communication Communication: No apparent difficulties   Cognition Arousal: Alert Behavior During Therapy: WFL for tasks assessed/performed Cognition: No apparent impairments                               Following commands: Intact        Cueing   Cueing Techniques: Verbal cues, Gestural cues  Exercises      Shoulder Instructions        General Comments Did not note any drainage from wound bandage.    Pertinent Vitals/ Pain       Pain Assessment Pain Assessment: Faces Faces Pain Scale: Hurts whole lot Pain Location: back Pain Descriptors / Indicators: Sore, Grimacing, Discomfort Pain Intervention(s): Limited activity within patient's tolerance, Monitored during session  Home Living                                          Prior Functioning/Environment              Frequency  Min 2X/week        Progress Toward Goals  OT Goals(current goals can now be found in the care plan section)  Progress towards OT goals: Progressing toward goals  Acute Rehab OT Goals OT [place] Formulation: With patient Time For [place] Achievement: 01/27/24 Potential to Achieve Goals: Good ADL Goals Pt Will Perform Lower Body Bathing: with adaptive equipment;with min assist Pt Will Perform Lower Body Dressing: with adaptive equipment;with min assist Pt Will Transfer to Toilet: with modified independence Pt Will Perform Toileting - Clothing Manipulation and hygiene: with contact guard assist Pt Will Perform Tub/Shower Transfer: with min assist  Plan      Co-evaluation                 AM-PAC OT 6 Clicks Daily Activity     Outcome Measure   Help from another person eating meals?: None Help from another person taking care of personal grooming?: A Little Help from another person toileting, which includes using toliet, bedpan, or urinal?: A Lot Help from another person bathing (including washing, rinsing, drying)?: A Lot Help from another person to put on and taking off regular upper body clothing?: A Little Help from another person to put on and taking off regular lower body clothing?: A Lot 6 Click Score: 16    End of Session Equipment Utilized During Treatment: Gait belt;Rolling walker (2 wheels)  OT Visit Diagnosis: Unsteadiness on feet (R26.81);Muscle weakness (generalized) (M62.81)   Activity  Tolerance Patient tolerated treatment well   Patient Left in bed;with bed alarm set;with call bell/phone within reach   Nurse Communication Mobility status        Time: 1410-1440 OT Time Calculation (min): 30 min  Charges: OT General Charges $OT Visit: 1 Visit OT Treatments $Self Care/Home Management : 23-37 mins  [name], OTR/L [facility] Acute Rehabilitation Office: [phone]   Colleen JONETTA Lebron 01/14/2024, 3:00 PM

## 2024-01-14 NOTE — Progress Notes (Signed)
 Physical Therapy Treatment Patient Details Name: [name] MRN: [id number] DOB: [birth year] Today's Date: 01/14/2024   History of Present Illness 68 yo female s/p L3-4 PLIF 10/24. PMH includes hypertension, blood clots, hypercholesterolemia, heartburn, asthma, COPD, seizure disorder, L4-5 fusion 2013, bilat TKR 2010, fibromyalgia,  and arthritis.    PT Comments  Pt received supine in bed and agreeable to PT session. Pt requiring mod assist to power into standing with increased time. Continued to reinforce rollator safety including using brakes prior to sit<>stand, with pt confirming understanding throughout. Able to ambulate 26' today with CGA and close chair follow. Pt did not require a rest break today, but did demonstrate decreased gait speed and increased dyspnea with increased distance. Pt requested to practice with RW in future PT sessions, and have added this to our home equipment recommendations. Continuing to recommend [facility] PT upon d/c. Acute PT to follow.      If plan is discharge home, recommend the following: A little help with walking and/or transfers;A little help with bathing/dressing/bathroom   Can travel by private vehicle        Equipment Recommendations  Rolling walker (2 wheels)    Recommendations for Other Services       Precautions / Restrictions Precautions Precautions: Fall;Back Precaution Booklet Issued: Yes (comment) Recall of Precautions/Restrictions: Intact Precaution/Restrictions Comments: per orders no brace needed Restrictions Weight Bearing Restrictions Per Provider Order: No     Mobility  Bed Mobility Overal bed mobility: Needs Assistance Bed Mobility: Sidelying to Sit, Sit to Sidelying, Rolling Rolling: Min assist Sidelying to sit: Min assist     Sit to sidelying: Min assist General bed mobility comments: Pt requiring min assist for negotiation of leg position and assist to push into sitting upright from sidelying.     Transfers Overall transfer level: Needs assistance Equipment used: Rollator (4 wheels) Transfers: Sit to/from Stand Sit to Stand: Mod assist           General transfer comment: Required mod assist and increased time to power up to stand today. Used verbal cueing to lock rollator brakes and push up from bed with at least one hand before attempting to stand.    Ambulation/Gait Ambulation/Gait assistance: Contact guard assist Gait Distance (Feet): 108 Feet (No rest break) Assistive device: Rollator (4 wheels) Gait Pattern/deviations: Step-through pattern, Decreased stride length, Trunk flexed, Decreased step length - right, Decreased step length - left Gait velocity: decr Gait velocity interpretation: <1.31 ft/sec, indicative of household ambulator   General Gait Details: CGA for safety plus close chair follow. Pt did not have to take rest break today during ambulation and was able to ambulate further than previous session. Gait speed slowed and noted mild dyspnea with increased distance.   Stairs             Wheelchair Mobility     Tilt Bed    Modified Rankin (Stroke Patients Only)       Balance Overall balance assessment: Needs assistance, History of Falls Sitting-balance support: No upper extremity supported, Feet supported Sitting balance-Leahy Scale: Fair Sitting balance - Comments: Pt required minA to scoot hips forward to sit fully upright. Pt attempted to reach for hand rail to help pull self more upright, but could not reach to assist.   Standing balance support: Bilateral upper extremity supported, During functional activity, Reliant on assistive device for balance Standing balance-Leahy Scale: Poor Standing balance comment: Reliant on RW for static balance.  Communication Communication Communication: No apparent difficulties  Cognition Arousal: Alert Behavior During Therapy: WFL for tasks assessed/performed    PT - Cognitive impairments: No apparent impairments                       PT - Cognition Comments: Appeared alert and oriented, able to follow commands consistently. Following commands: Intact      Cueing Cueing Techniques: Verbal cues, Gestural cues  Exercises      General Comments General comments (skin integrity, edema, etc.): Did not note any drainage from wound bandage.      Pertinent Vitals/Pain Pain Assessment Pain Assessment: 0-10 Pain Score: 8  Pain Location: back Pain Descriptors / Indicators: Sore, Grimacing, Discomfort Pain Intervention(s): Limited activity within patient's tolerance, Monitored during session, Patient requesting pain meds-RN notified    Home Living                          Prior Function            PT Goals (current goals can now be found in the care plan section) Acute Rehab PT Goals Patient Stated [place]: home with family help PT [place] Formulation: With patient Time For [place] Achievement: 01/26/24 Potential to Achieve Goals: Good Progress towards PT goals: Progressing toward goals    Frequency    Min 5X/week      PT Plan      Co-evaluation              AM-PAC PT 6 Clicks Mobility   Outcome Measure  Help needed turning from your back to your side while in a flat bed without using bedrails?: A Little Help needed moving from lying on your back to sitting on the side of a flat bed without using bedrails?: A Little Help needed moving to and from a bed to a chair (including a wheelchair)?: A Little Help needed standing up from a chair using your arms (e.g., wheelchair or bedside chair)?: A Lot Help needed to walk in hospital room?: A Little Help needed climbing 3-5 steps with a railing? : A Little 6 Click Score: 17    End of Session Equipment Utilized During Treatment: Gait belt Activity Tolerance: Patient tolerated treatment well;Patient limited by fatigue Patient left: in chair;with chair alarm  set;with call bell/phone within reach Nurse Communication: Mobility status PT Visit Diagnosis: Other abnormalities of gait and mobility (R26.89);Muscle weakness (generalized) (M62.81)     Time: 8888-8867 PT Time Calculation (min) (ACUTE ONLY): 21 min  Charges:    $Therapeutic Activity: 8-22 mins PT General Charges $$ ACUTE PT VISIT: 1 Visit                     Paxson Harrower, SPT    Macallister Ashmead 01/14/2024, 1:51 PM

## 2024-01-14 NOTE — TOC Initial Note (Signed)
 Transition of Care [facility]) - Initial/Assessment Note    Patient Details  Name: [name] MRN: [id number] Date of Birth: 1955-07-21  [facility] CM/SW Contact:    Andrez JULIANNA George, RN Phone Number: 01/14/2024, 1:20 PM  Clinical Narrative:                  Pt is s/p: LUMBAR three laminectomy for DECOMPRESSION LUMBAR THREE-FOUR POSTERIOR LATERAL ARTHODESIS  With autograft  LUMBAR THREE-FOUR, POSTERIOR NON-SEGMENTAL percutaneous pedicle screw FIXATION (nuvasive)  Pt is from home alone. She has a caregiver from 12:30p-3:30p daily. She says her kids and neighbors will also check on her and assist.  Daughter will provide needed transportation. Pt has recently used [facility] for [facility] and asked to use them again. [facility] accepted and information on the AVS. [facility] will contact her for the first home visit. Walker for home ordered through [employer] and will be delivered to the room.   IP Care management following.  Expected Discharge Plan: Home w Home Health Services Barriers to Discharge: Continued Medical Work up   Patient Goals and CMS Choice   CMS Medicare.gov Compare Post Acute Care list provided to:: Patient Choice offered to / list presented to : Patient      Expected Discharge Plan and Services   Discharge Planning Services: CM Consult Post Acute Care Choice: Home Health, Durable Medical Equipment Living arrangements for the past 2 months: Apartment                 DME Arranged: Vannie rolling DME Agency: [employer] Date DME Agency Contacted: 01/14/24   Representative spoke with at DME Agency: [place] HH Arranged: PT, OT HH Agency: [facility] ([facility]) Date [facility] Agency Contacted: 01/14/24   Representative spoke with at [facility] Agency: Jon  Prior Living Arrangements/Services Living arrangements for the past 2 months: Apartment Lives with:: Self Patient language and need for interpreter reviewed:: Yes Do you feel safe going  back to the place where you live?: Yes        Care giver support system in place?: Yes (comment) Current home services: DME (shower seat/ [facility]) Criminal Activity/Legal Involvement Pertinent to Current Situation/Hospitalization: No - Comment as needed  Activities of Daily Living   ADL Screening (condition at time of admission) Independently performs ADLs?: Yes (appropriate for developmental age) Is the patient deaf or have difficulty hearing?: No Does the patient have difficulty seeing, even when wearing glasses/contacts?: No Does the patient have difficulty concentrating, remembering, or making decisions?: No  Permission Sought/Granted                  Emotional Assessment Appearance:: Appears stated age Attitude/Demeanor/Rapport: Engaged Affect (typically observed): Accepting Orientation: : Oriented to Self, Oriented to Place, Oriented to  Time, Oriented to Situation   Psych Involvement: No (comment)  Admission diagnosis:  Lumbar adjacent segment disease with spondylolisthesis [M51.369, M43.16] Spinal stenosis, lumbar region with neurogenic claudication [M48.062] Patient Active Problem List   Diagnosis Date Noted   Spinal stenosis, lumbar region with neurogenic claudication 01/11/2024   Spinal stenosis of lumbar region with neurogenic claudication 10/23/2023   Lumbar herniated disc 10/23/2023   Neuroforaminal stenosis of lumbosacral spine 10/23/2023   History of lumbar spinal fusion 10/23/2023   Absence of bladder continence 08/23/2023   Syncope 07/27/2023   Acute blood loss anemia 07/23/2023   History of COPD 07/23/2023   DM2 (diabetes mellitus, type 2) (HCC) 03/05/2023   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  Chronic idiopathic constipation 03/05/2023   Gastro-esophageal reflux disease without esophagitis 03/05/2023   Iron  deficiency anemia 03/05/2023   Screening for malignant neoplasm of colon 03/05/2023   Generalized anxiety disorder 10/19/2022   Major  depressive disorder, recurrent, moderate (HCC) 10/19/2022   Anemia of chronic disease 08/23/2022   Left knee pain 08/23/2022   HLD (hyperlipidemia) 10/07/2021   Paroxysmal SVT (supraventricular tachycardia) 10/07/2021   Allergic rhinitis 11/29/2020   Allergic rhinitis due to pollen 11/29/2020   Moderate persistent asthma without complication 11/29/2020   Osteoarthritis of spine with radiculopathy, lumbar region 08/24/2020   Chronic back pain 08/18/2020   Obesity, diabetes, and hypertension syndrome (HCC) 02/27/2020   Vitreomacular adhesion of both eyes 02/09/2020   Pseudophakia of both eyes 02/09/2020   Chronic diastolic CHF (congestive heart failure) (HCC) 03/12/2019   Hypernatremia 03/09/2019   Suspected COVID-19 virus infection 03/09/2019   Thyroid  mass 02/27/2017   Acute pain of right lower extremity 05/24/2016   Effusion, right knee 04/28/2016   Presence of right artificial knee joint 04/28/2016   Abnormal LFTs    Decreased sensation    Paresthesia 06/12/2014   Obstruction of kidney 04/24/2014   Chronic anticoagulation 04/24/2014   Crack cocaine use 04/24/2014   Depression 04/24/2014   Fibromyalgia 04/24/2014   Obstructive sleep apnea on CPAP 04/24/2014   Lupus anticoagulant positive 04/24/2014   Adrenal mass, right 04/24/2014   Right kidney mass 04/24/2014   Hydronephrosis of right kidney 04/24/2014   Supratherapeutic INR 04/24/2014   Renal mass 04/24/2014   Chronic obstructive pulmonary disease (HCC) 04/24/2014   Right lower quadrant abdominal pain    Morbid obesity (HCC) 08/21/2013   Constipation 08/19/2013   Lumbosacral spondylosis without myelopathy 11/19/2012   Morbid obesity with BMI of 45.0-49.9, adult (HCC) 01/05/2012   Bilateral deep vein thromboses (HCC) 12/13/2011   Hypokalemia 11/05/2011   Sciatica 11/05/2011   Essential hypertension 11/05/2011   Diabetes type 2, controlled (HCC) 11/05/2011   PCP:  Mercer Clotilda SAUNDERS, MD Pharmacy:   [name] - [address], [place] - [address] [place] [address] Phone: [phone] Fax: [phone]  [facility] [place]  - [place], [place] - [address] [place] [address] Phone: [phone] Fax: [phone]  [name] - [place], [place] - [address] AT [facility] OF [address] [place] [address] Phone: [phone] Fax: [phone]  [facility] [place], [place] - [address] [facility] [employer] [place] [address] Phone: [phone] Fax: [phone]     Social Drivers of Health (SDOH) Social History: SDOH Screenings   Food Insecurity: No Food Insecurity (01/12/2024)  Housing: Low Risk  (01/12/2024)  Transportation Needs: No Transportation Needs (01/12/2024)  Utilities: Not At Risk (01/12/2024)  Alcohol Screen: Low Risk  (03/07/2023)  Depression (PHQ2-9): High Risk (09/27/2023)  Financial Resource Strain: Low Risk  (03/07/2023)  Physical Activity: Insufficiently Active (03/07/2023)  Social Connections: Moderately Integrated (01/12/2024)  Stress: No Stress Concern Present (03/07/2023)  Tobacco Use: Low Risk  (01/11/2024)  Health Literacy: Adequate Health Literacy (03/07/2023)   SDOH Interventions:     Readmission Risk Interventions    09/01/2022   11:01 AM  Readmission Risk Prevention Plan  Transportation Screening Complete  Medication Review (RN Care Manager) Complete  PCP or Specialist appointment within 3-5 days of discharge Complete  HRI or Home Care Consult Complete  SW  Recovery Care/Counseling Consult Complete  Palliative Care Screening Not Applicable  Skilled Nursing Facility Complete

## 2024-01-14 NOTE — Plan of Care (Signed)
  Problem: Education: Goal: Knowledge of General Education information will improve Description: Including pain rating scale, medication(s)/side effects and non-pharmacologic comfort measures Outcome: Progressing   Problem: Health Behavior/Discharge Planning: Goal: Ability to manage health-related needs will improve Outcome: Progressing   Problem: Clinical Measurements: Goal: Ability to maintain clinical measurements within normal limits will improve Outcome: Progressing Goal: Will remain free from infection Outcome: Progressing Goal: Diagnostic test results will improve Outcome: Progressing Goal: Respiratory complications will improve Outcome: Progressing Goal: Cardiovascular complication will be avoided Outcome: Progressing   Problem: Activity: Goal: Risk for activity intolerance will decrease Outcome: Progressing   Problem: Nutrition: Goal: Adequate nutrition will be maintained Outcome: Progressing   Problem: Coping: Goal: Level of anxiety will decrease Outcome: Progressing   Problem: Elimination: Goal: Will not experience complications related to bowel motility Outcome: Progressing Goal: Will not experience complications related to urinary retention Outcome: Progressing   Problem: Pain Managment: Goal: General experience of comfort will improve and/or be controlled Outcome: Progressing   Problem: Safety: Goal: Ability to remain free from injury will improve Outcome: Progressing   Problem: Skin Integrity: Goal: Risk for impaired skin integrity will decrease Outcome: Progressing   Problem: Education: Goal: Ability to verbalize activity precautions or restrictions will improve Outcome: Progressing Goal: Knowledge of the prescribed therapeutic regimen will improve Outcome: Progressing Goal: Understanding of discharge needs will improve Outcome: Progressing   Problem: Activity: Goal: Ability to avoid complications of mobility impairment will  improve Outcome: Progressing Goal: Ability to tolerate increased activity will improve Outcome: Progressing Goal: Will remain free from falls Outcome: Progressing   Problem: Bowel/Gastric: Goal: Gastrointestinal status for postoperative course will improve Outcome: Progressing   Problem: Clinical Measurements: Goal: Ability to maintain clinical measurements within normal limits will improve Outcome: Progressing Goal: Postoperative complications will be avoided or minimized Outcome: Progressing Goal: Diagnostic test results will improve Outcome: Progressing   Problem: Pain Management: Goal: Pain level will decrease Outcome: Progressing   Problem: Skin Integrity: Goal: Will show signs of wound healing Outcome: Progressing   Problem: Health Behavior/Discharge Planning: Goal: Identification of resources available to assist in meeting health care needs will improve Outcome: Progressing   Problem: Bladder/Genitourinary: Goal: Urinary functional status for postoperative course will improve Outcome: Progressing

## 2024-01-15 DIAGNOSIS — I110 Hypertensive heart disease with heart failure: Secondary | ICD-10-CM | POA: Diagnosis not present

## 2024-01-15 DIAGNOSIS — E039 Hypothyroidism, unspecified: Secondary | ICD-10-CM | POA: Diagnosis not present

## 2024-01-15 DIAGNOSIS — M4316 Spondylolisthesis, lumbar region: Secondary | ICD-10-CM | POA: Diagnosis not present

## 2024-01-15 DIAGNOSIS — I509 Heart failure, unspecified: Secondary | ICD-10-CM | POA: Diagnosis not present

## 2024-01-15 DIAGNOSIS — M48062 Spinal stenosis, lumbar region with neurogenic claudication: Secondary | ICD-10-CM | POA: Diagnosis not present

## 2024-01-15 DIAGNOSIS — J449 Chronic obstructive pulmonary disease, unspecified: Secondary | ICD-10-CM | POA: Diagnosis not present

## 2024-01-15 DIAGNOSIS — E119 Type 2 diabetes mellitus without complications: Secondary | ICD-10-CM | POA: Diagnosis not present

## 2024-01-15 LAB — TYPE AND SCREEN
ABO/RH(D): A POS
Antibody Screen: POSITIVE
Donor AG Type: NEGATIVE
Donor AG Type: NEGATIVE
Unit division: 0
Unit division: 0

## 2024-01-15 LAB — BPAM RBC
Blood Product Expiration Date: 202511042359
Blood Product Expiration Date: 202511152359
Unit Type and Rh: 6200
Unit Type and Rh: 6200

## 2024-01-15 NOTE — Progress Notes (Signed)
 Noticed patient has an order for nebulizers once discharged. Spoke with patient via camera,and she states she has never used a neb and does not have the machine at home. Message sent to CM.

## 2024-01-15 NOTE — Anesthesia Postprocedure Evaluation (Signed)
 Anesthesia Post Note  Patient: [name]  Procedure(s) Performed: POSTERIOR LATERAL ARTHODESIS LUMBAR THREE-FOUR, POSTERIOR NON-SEGMENTAL FIXATION LUMBAR THREE-FOUR, LUMBAR DECOMPRESSION LUMBAR THREE-FOUR (Spine Lumbar)     Patient location during evaluation: PACU Anesthesia Type: General Level of consciousness: patient cooperative Pain management: pain level controlled Vital Signs Assessment: post-procedure vital signs reviewed and stable Respiratory status: spontaneous breathing, nonlabored ventilation, respiratory function stable and patient connected to nasal cannula oxygen  Cardiovascular status: blood pressure returned to baseline and stable Postop Assessment: no apparent nausea or vomiting Anesthetic complications: no   No notable events documented.               Kenika Sahm

## 2024-01-15 NOTE — Progress Notes (Signed)
 Physical Therapy Treatment Patient Details Name: Jarita Raval Borak MRN: [id number] DOB: [birth year] Today's Date: 01/15/2024   History of Present Illness 68 yo female s/p L3-4 PLIF 10/24. PMH includes hypertension, blood clots, hypercholesterolemia, heartburn, asthma, COPD, seizure disorder, L4-5 fusion 2013, bilat TKR 2010, fibromyalgia,  and arthritis.    PT Comments  Pt planning to be discharged home from [facility] today. Pt displaying progress toward PT goals as she required no physical assist from therapist to perform sidelying>sit and sit>stand, only CGA. Able to ambulate two bouts of 78' with RW and CGA today, but distance was limited by pt's back and leg pain which she reported were at an 8-9/10. Pt continues to demonstate dec gait speed, flexed trunk, and shortened step length bilaterally with gait. Pt to continue to make progress toward functional mobility goals with [facility] PT.    If plan is discharge home, recommend the following: A little help with walking and/or transfers;A little help with bathing/dressing/bathroom   Can travel by private vehicle        Equipment Recommendations  Rolling walker (2 wheels)    Recommendations for Other Services       Precautions / Restrictions Precautions Precautions: Fall;Back Precaution Booklet Issued: Yes (comment) Recall of Precautions/Restrictions: Intact Precaution/Restrictions Comments: per orders no brace needed Restrictions Weight Bearing Restrictions Per Provider Order: No     Mobility  Bed Mobility Overal bed mobility: Needs Assistance Bed Mobility: Sidelying to Sit, Sit to Sidelying   Sidelying to sit: Contact guard assist     Sit to sidelying: Min assist (For leg positioning back into bed)      Transfers Overall transfer level: Needs assistance Equipment used: Rolling walker (2 wheels) Transfers: Sit to/from Stand Sit to Stand: Contact guard assist           General transfer comment: Required no physical assist to  stand, just increased time. CGA for safety.    Ambulation/Gait Ambulation/Gait assistance: Contact guard assist Gait Distance (Feet): 27 Feet (x2) Assistive device: Rolling walker (2 wheels) Gait Pattern/deviations: Step-through pattern, Decreased stride length, Trunk flexed, Decreased step length - right, Decreased step length - left Gait velocity: decr Gait velocity interpretation: <1.31 ft/sec, indicative of household ambulator   General Gait Details: CGA for safety today. Pt's ambulation distance limited today as compared to last session d/t reports of back pain and leg pain. Seated rest break in between two bouts of ambulation.   Stairs             Wheelchair Mobility     Tilt Bed    Modified Rankin (Stroke Patients Only)       Balance Overall balance assessment: Needs assistance, History of Falls Sitting-balance support: No upper extremity supported, Feet supported Sitting balance-Leahy Scale: Fair Sitting balance - Comments: CGA in sitting as preparing for transfer. Pt did not demonstrate any sway or leaninng upon sitting, but did grab rail with L hand to assist into fully upright position.   Standing balance support: Bilateral upper extremity supported, During functional activity, Reliant on assistive device for balance Standing balance-Leahy Scale: Poor Standing balance comment: Reliant on RW for static balance.                            Communication Communication Communication: No apparent difficulties  Cognition Arousal: Alert Behavior During Therapy: WFL for tasks assessed/performed   PT - Cognitive impairments: No apparent impairments  PT - Cognition Comments: Appeared alert and oriented, able to follow commands consistently. Following commands: Intact      Cueing Cueing Techniques: Verbal cues, Gestural cues  Exercises      General Comments General comments (skin integrity, edema, etc.): Pt able to  perform bed mobility and STS today with no physical assist from therapist, only CGA.      Pertinent Vitals/Pain Pain Assessment Pain Assessment: 0-10 Pain Score: 9  Pain Location: back Pain Descriptors / Indicators: Sore, Grimacing, Discomfort Pain Intervention(s): Limited activity within patient's tolerance, Monitored during session    Home Living                          Prior Function            PT Goals (current goals can now be found in the care plan section) Acute Rehab PT Goals Patient Stated [place]: home with family help PT [place] Formulation: With patient Time For [place] Achievement: 01/26/24 Potential to Achieve Goals: Good Progress towards PT goals: Goals met/education completed, patient discharged from PT    Frequency    Min 5X/week      PT Plan      Co-evaluation              AM-PAC PT 6 Clicks Mobility   Outcome Measure  Help needed turning from your back to your side while in a flat bed without using bedrails?: A Little Help needed moving from lying on your back to sitting on the side of a flat bed without using bedrails?: A Little Help needed moving to and from a bed to a chair (including a wheelchair)?: A Little Help needed standing up from a chair using your arms (e.g., wheelchair or bedside chair)?: A Little Help needed to walk in hospital room?: A Little Help needed climbing 3-5 steps with a railing? : A Little 6 Click Score: 18    End of Session Equipment Utilized During Treatment: Gait belt Activity Tolerance: Patient tolerated treatment well;Patient limited by fatigue Patient left: in bed;with bed alarm set;with call bell/phone within reach Nurse Communication: Mobility status PT Visit Diagnosis: Other abnormalities of gait and mobility (R26.89);Muscle weakness (generalized) (M62.81)     Time: 9060-9044 PT Time Calculation (min) (ACUTE ONLY): 16 min  Charges:                            Rashelle Ireland,  SPT    Afreen Siebels 01/15/2024, 12:54 PM

## 2024-01-15 NOTE — Progress Notes (Signed)
 DISCHARGE NOTE HOME [name] to be discharged Home per MD order. Discussed prescriptions and follow up appointments with the patient. Prescriptions given to patient; medication list explained in detail. Patient verbalized understanding.  Skin clean, dry and intact without evidence of skin break down, no evidence of skin tears noted. IV catheter discontinued intact. Site without signs and symptoms of complications. Dressing and pressure applied. Pt denies pain at the site currently. No complaints noted.  Patient free of lines, drains, and wounds.   An After Visit Summary (AVS) was printed and given to the patient. Patient escorted via wheelchair, and discharged home via private auto.  [name], RN

## 2024-01-15 NOTE — TOC Transition Note (Signed)
 Transition of Care [facility]) - Discharge Note   Patient Details  Name: [name] MRN: [id number] Date of Birth: 1955/09/28  Transition of Care [facility]) CM/SW Contact:  Andrez JULIANNA George, RN Phone Number: 01/15/2024, 10:11 AM   Clinical Narrative:     Pt is discharging home with home health through [facility]. Information on the AVS. Pt has nebulizer treatments without a machine at home. Orders sent to [employer] to deliver the machine to the room.  Pt has transportation home.   Final next level of care: Home w Home Health Services Barriers to Discharge: No Barriers Identified   Patient Goals and CMS Choice   CMS Medicare.gov Compare Post Acute Care list provided to:: Patient Choice offered to / list presented to : Patient      Discharge Placement                       Discharge Plan and Services Additional resources added to the After Visit Summary for     Discharge Planning Services: CM Consult Post Acute Care Choice: Home Health, Durable Medical Equipment          DME Arranged: Nebulizer machine DME Agency: [employer] Date DME Agency Contacted: 01/15/24   Representative spoke with at DME Agency: [place] HH Arranged: PT, OT HH Agency: [facility]) Date [facility] Agency Contacted: 01/14/24   Representative spoke with at [facility] Agency: [facility]  Social Drivers of Health (SDOH) Interventions SDOH Screenings   Food Insecurity: No Food Insecurity (01/12/2024)  Housing: Low Risk  (01/12/2024)  Transportation Needs: No Transportation Needs (01/12/2024)  Utilities: Not At Risk (01/12/2024)  Alcohol Screen: Low Risk  (03/07/2023)  Depression (PHQ2-9): High Risk (09/27/2023)  Financial Resource Strain: Low Risk  (03/07/2023)  Physical Activity: Insufficiently Active (03/07/2023)  Social Connections: Moderately Integrated (01/12/2024)  Stress: No Stress Concern Present (03/07/2023)  Tobacco Use: Low Risk  (01/11/2024)  Health Literacy:  Adequate Health Literacy (03/07/2023)     Readmission Risk Interventions    09/01/2022   11:01 AM  Readmission Risk Prevention Plan  Transportation Screening Complete  Medication Review (RN Care Manager) Complete  PCP or Specialist appointment within 3-5 days of discharge Complete  HRI or Home Care Consult Complete  SW Recovery Care/Counseling Consult Complete  Palliative Care Screening Not Applicable  Skilled Nursing Facility Complete

## 2024-01-16 NOTE — Discharge Summary (Signed)
 Physician Discharge Summary  Patient ID: [name] MRN: [id number] DOB/AGE: 10/28/1955 68 y.o.  Admit date: 01/11/2024 Discharge date: 01/16/2024  Admission Diagnoses:lumbar spinal stenosis, lumbar with neurogenic claudication three/four  Discharge Diagnoses:  Principal Problem:   Spinal stenosis, lumbar region with neurogenic claudication   Discharged Condition: good  Hospital Course: [name] was admitted and taken to the operating room for an uncomplicated lumbar decompression at L3/4. A laminectomy and posterolateral arthrodesis with pedicle screw fixation was performed. Post op she improved with PT. She was discharged on Tuesday with home health. Her wound was clean, dry, and without signs of infection. She was ambulating, voiding, and tolerating a regular diet at discharge.   Treatments: LUMBAR three laminectomy for DECOMPRESSION LUMBAR THREE-FOUR POSTERIOR LATERAL ARTHODESIS  With autograft  LUMBAR THREE-FOUR, POSTERIOR NON-SEGMENTAL percutaneous pedicle screw FIXATION (nuvasive)    Discharge Exam: Blood pressure 137/73, pulse 85, temperature 98.5 F (36.9 C), resp. rate 16, height 5' 6 (1.676 m), weight 125.2 kg, SpO2 96%. General appearance: alert, cooperative, and no distress  Disposition: Discharge disposition: 01-Home or Self Care      Lumbar adjacent segment disease with spondylolisthesis Discharge Instructions     Incentive spirometry RT   Complete by: As directed       Allergies as of 01/15/2024       Reactions   Lisinopril  Anaphylaxis, Swelling, Other (See Comments)   THROAT SWELLING, Pt states her throat started to close up after being on it for so long.   Prednisone  Anaphylaxis, Swelling, Other (See Comments)   Throat swelling and tongue irritation   Spironolactone  Other (See Comments)   Caused stroke-like symptoms with the mouth   Corticosteroids Rash   States she developed a rash on her tongue after a steroid injection in her  back        Medication List     STOP taking these medications    traMADol  50 MG tablet Commonly known as: ULTRAM        TAKE these medications    Accu-Chek Guide w/Device Kit USE TO TEST BLOOD SUGAR 3 TIMES DAILY   acetaminophen  500 MG tablet Commonly known as: TYLENOL  Take 500-1,000 mg by mouth every 6 (six) hours as needed for moderate pain (pain score 4-6).   albuterol  108 (90 Base) MCG/ACT inhaler Commonly known as: VENTOLIN  HFA INHALE TWO (2) PUFFS BY MOUTH EVERY 4 HOURS AS NEEDED FOR SHORTNESS OF BREATH   albuterol  0.63 MG/3ML nebulizer solution Commonly known as: ACCUNEB  Take 3 mLs (0.63 mg total) by nebulization every 6 (six) hours as needed for wheezing.   Allergy Relief 180 MG tablet Generic drug: fexofenadine  Take 180 mg by mouth daily.   cloNIDine  0.1 MG tablet Commonly known as: CATAPRES  Take 0.1 mg by mouth 2 (two) times daily.   diclofenac  sodium 1 % Gel Commonly known as: VOLTAREN  Apply 2 g topically daily as needed (fibromyalgia pain- affected sites).   diltiazem  240 MG 24 hr capsule Commonly known as: CARDIZEM  CD TAKE 1 CAPSULE BY MOUTH DAILY BEFORE BREAKFAST   DULoxetine  30 MG capsule Commonly known as: CYMBALTA  Take 90 mg by mouth at bedtime.   FeroSul 325 (65 Fe) MG tablet Generic drug: ferrous sulfate  TAKE ONE TABLET BY MOUTH ONCE DAILY   fluticasone  50 MCG/ACT nasal spray Commonly known as: FLONASE  Place 2 sprays into both nostrils daily as needed for allergies.   furosemide  80 MG tablet Commonly known as: LASIX  TAKE 1/2 TABLET (40 MG TOTAL) BY MOUTH DAILY. PLEASE SCHEDULE OVERDUE  CARDIOLOGY OFFICE VISIT FOR FUTURE REFILLS What changed: See the new instructions.   gabapentin  300 MG capsule Commonly known as: NEURONTIN  TAKE ONE (1) CAPSULE BY MOUTH TWICE DAILY   Invokana  100 MG Tabs tablet Generic drug: canagliflozin  TAKE 1 TABLET BY MOUTH DAILY BEFORE BREAKFAST *PATIENT NEEDS APPOINTMENT WITH CARDIOLOGIST FOR FUTURE  REFILLS* What changed: See the new instructions.   lidocaine  5 % Commonly known as: LIDODERM  APPLY 1 PATCH TOPICALLY ON THE SKIN DAILY AS NEEDED TO PAINFUL SITE *12 HOURS ON AND 12 HOURS OFF OR AS DIRECTED BY MD*   Linzess  145 MCG Caps capsule Generic drug: linaclotide  Take 1 capsule (145 mcg total) by mouth every other day.   metFORMIN  500 MG tablet Commonly known as: GLUCOPHAGE  TAKE 1 TABLET BY MOUTH EVERY DAY AT BEDTIME   omeprazole  20 MG capsule Commonly known as: PRILOSEC TAKE 1 CAPSULE BY MOUTH TWICE DAILY BEFORE A MEAL   oxyCODONE  5 MG immediate release tablet Commonly known as: Oxy IR/ROXICODONE  Take 1 tablet (5 mg total) by mouth every 4 (four) hours as needed for moderate pain (pain score 4-6).   Ozempic  (2 MG/DOSE) 8 MG/3ML Sopn Generic drug: Semaglutide  (2 MG/DOSE) INJECT 2MG  SUBCUTANEOUSLY ONCE WEEKLY   potassium chloride  10 MEQ CR capsule Commonly known as: MICRO-K  Take 20 mEq by mouth at bedtime.   QUEtiapine  100 MG tablet Commonly known as: SEROQUEL  Take 150 mg by mouth at bedtime.   rosuvastatin  10 MG tablet Commonly known as: CRESTOR  TAKE 1 TABLET BY MOUTH DAILY   spironolactone  25 MG tablet Commonly known as: ALDACTONE  Take 0.5 tablets (12.5 mg total) by mouth daily.   Symbicort  160-4.5 MCG/ACT inhaler Generic drug: budesonide -formoterol  INHALE TWO (2) PUFFS BY MOUTH TWICE DAILY   tiZANidine  4 MG tablet Commonly known as: ZANAFLEX  Take 1 tablet (4 mg total) by mouth every 6 (six) hours as needed for muscle spasms.   warfarin 7.5 MG tablet Commonly known as: COUMADIN  Take as directed. If you are unsure how to take this medication, talk to your nurse or doctor. Original instructions: Take 1 tablet (7.5 mg total) by mouth See admin instructions. Take 7.5mg  (1 tablet) by mouth on Sunday, Tuesday, Thursday, and Saturday.   warfarin 10 MG tablet Commonly known as: COUMADIN  Take as directed. If you are unsure how to take this medication, talk to  your nurse or doctor. Original instructions: TAKE DAILY AS DIRECTED BY MD   zonisamide  100 MG capsule Commonly known as: ZONEGRAN  TAKE 1 CAPSULE BY MOUTH EACH MORNING AND TAKE 2 CAPSULES IN THE EVENING        Contact information for follow-up providers     Gillie Duncans, MD Follow up.   Specialty: Neurosurgery Why: keep your scheduled appointment Contact information: [address] [place] [address] [phone]              Contact information for after-discharge care     Home Medical Care     [facility]) .   Service: Home Health Services Contact information: [phone] [address] [facility]   [phone]                     Signed: Duncans Gillie 01/16/2024, 12:42 PM

## 2024-01-21 ENCOUNTER — Encounter: Payer: Self-pay | Admitting: Radiology

## 2024-02-05 ENCOUNTER — Telehealth: Payer: Self-pay | Admitting: Internal Medicine

## 2024-02-05 MED ORDER — FUROSEMIDE 80 MG PO TABS: 40.0000 mg | ORAL_TABLET | Freq: Every day | ORAL | 1 refills | Status: AC

## 2024-02-05 NOTE — Telephone Encounter (Signed)
 Pt's medication was sent to pt's pharmacy as requested. Confirmation received.

## 2024-02-05 NOTE — Telephone Encounter (Signed)
*  STAT* If patient is at the pharmacy, call can be transferred to refill team.   1. Which medications need to be refilled? (please list name of each medication and dose if known)   furosemide  (LASIX ) 80 MG tablet    2. Which pharmacy/location (including street and city if local pharmacy) is medication to be sent to?  [facility] - [place]  - [place], [place] - [address]      3. Do they need a 30 day or 90 day supply? 90 day    Pt is out of medication

## 2024-02-06 ENCOUNTER — Encounter: Payer: Self-pay | Admitting: Family Medicine

## 2024-02-06 ENCOUNTER — Other Ambulatory Visit: Payer: Self-pay | Admitting: Internal Medicine

## 2024-02-06 ENCOUNTER — Other Ambulatory Visit: Payer: Self-pay | Admitting: Family Medicine

## 2024-02-06 ENCOUNTER — Ambulatory Visit: Admitting: Family Medicine

## 2024-02-06 VITALS — BP 118/68 | HR 99 | Temp 98.6°F | Ht 66.0 in | Wt 283.4 lb

## 2024-02-06 DIAGNOSIS — J4540 Moderate persistent asthma, uncomplicated: Secondary | ICD-10-CM

## 2024-02-06 DIAGNOSIS — E1140 Type 2 diabetes mellitus with diabetic neuropathy, unspecified: Secondary | ICD-10-CM

## 2024-02-06 DIAGNOSIS — E119 Type 2 diabetes mellitus without complications: Secondary | ICD-10-CM

## 2024-02-06 DIAGNOSIS — R5383 Other fatigue: Secondary | ICD-10-CM

## 2024-02-06 DIAGNOSIS — Z1231 Encounter for screening mammogram for malignant neoplasm of breast: Secondary | ICD-10-CM

## 2024-02-06 DIAGNOSIS — I10 Essential (primary) hypertension: Secondary | ICD-10-CM

## 2024-02-06 DIAGNOSIS — G4733 Obstructive sleep apnea (adult) (pediatric): Secondary | ICD-10-CM

## 2024-02-06 DIAGNOSIS — M4316 Spondylolisthesis, lumbar region: Secondary | ICD-10-CM

## 2024-02-06 DIAGNOSIS — R600 Localized edema: Secondary | ICD-10-CM

## 2024-02-06 DIAGNOSIS — Z981 Arthrodesis status: Secondary | ICD-10-CM

## 2024-02-06 NOTE — Progress Notes (Signed)
 Established Patient Office Visit   Subjective  Patient ID: [name], female    DOB: [birth year]  Age: 68 y.o. MRN: [id number]  Chief Complaint  Patient presents with   Medical Management of Chronic Issues    Cap program form, fatigue all the time   Pt accompanied by her daughter [name].  Patient is a 68 year old female seen for follow-up.  Patient states she remains tired and feels weak all the time status post surgery.  Has not had CPAP x 4 months.  States pain is worse since surgery.  Noted as a deep inside pain.  Takes Tylenol  every other day for symptoms.  Seen by Ortho on 11/13 for follow-up.  Currently on a 5 pound weight restriction.  Has a cardiology appointment December 31 denies LE edema.  BP improved.    Patient Active Problem List   Diagnosis Date Noted   Spinal stenosis, lumbar region with neurogenic claudication 01/11/2024   Spinal stenosis of lumbar region with neurogenic claudication 10/23/2023   Lumbar herniated disc 10/23/2023   Neuroforaminal stenosis of lumbosacral spine 10/23/2023   History of lumbar spinal fusion 10/23/2023   Absence of bladder continence 08/23/2023   Syncope 07/27/2023   Acute blood loss anemia 07/23/2023   History of COPD 07/23/2023   DM2 (diabetes mellitus, type 2) (HCC) 03/05/2023   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   Chronic idiopathic constipation 03/05/2023   Gastro-esophageal reflux disease without esophagitis 03/05/2023   Iron  deficiency anemia 03/05/2023   Screening for malignant neoplasm of colon 03/05/2023   Generalized anxiety disorder 10/19/2022   Major depressive disorder, recurrent, moderate (HCC) 10/19/2022   Anemia of chronic disease 08/23/2022   Left knee pain 08/23/2022   HLD (hyperlipidemia) 10/07/2021   Paroxysmal SVT (supraventricular tachycardia) 10/07/2021   Allergic rhinitis 11/29/2020   Allergic rhinitis due to pollen 11/29/2020   Moderate persistent asthma without complication 11/29/2020    Osteoarthritis of spine with radiculopathy, lumbar region 08/24/2020   Chronic back pain 08/18/2020   Obesity, diabetes, and hypertension syndrome (HCC) 02/27/2020   Vitreomacular adhesion of both eyes 02/09/2020   Pseudophakia of both eyes 02/09/2020   Chronic diastolic CHF (congestive heart failure) (HCC) 03/12/2019   Hypernatremia 03/09/2019   Suspected COVID-19 virus infection 03/09/2019   Thyroid  mass 02/27/2017   Acute pain of right lower extremity 05/24/2016   Effusion, right knee 04/28/2016   Presence of right artificial knee joint 04/28/2016   Abnormal LFTs    Decreased sensation    Paresthesia 06/12/2014   Obstruction of kidney 04/24/2014   Chronic anticoagulation 04/24/2014   Crack cocaine use 04/24/2014   Depression 04/24/2014   Fibromyalgia 04/24/2014   Obstructive sleep apnea on CPAP 04/24/2014   Lupus anticoagulant positive 04/24/2014   Adrenal mass, right 04/24/2014   Right kidney mass 04/24/2014   Hydronephrosis of right kidney 04/24/2014   Supratherapeutic INR 04/24/2014   Renal mass 04/24/2014   Chronic obstructive pulmonary disease (HCC) 04/24/2014   Right lower quadrant abdominal pain    Morbid obesity (HCC) 08/21/2013   Constipation 08/19/2013   Lumbosacral spondylosis without myelopathy 11/19/2012   Morbid obesity with BMI of 45.0-49.9, adult (HCC) 01/05/2012   Bilateral deep vein thromboses (HCC) 12/13/2011   Hypokalemia 11/05/2011   Sciatica 11/05/2011   Essential hypertension 11/05/2011   Diabetes type 2, controlled (HCC) 11/05/2011   Past Medical History:  Diagnosis Date   Adrenal mass 03/226   Benign   Arthritis    knees/multiple orthopedic conditons; lower back  Asthma    per pt   Clotting disorder    +beta-2 -glycoprotein IgA antibody   COPD (chronic obstructive pulmonary disease) (HCC)    inhalers dependent on environment   Depression    Diabetes mellitus    120s usually fasting -  dx more than 10 yrs ago   Dizziness, nonspecific     DVT (deep venous thrombosis) (HCC)    Recurrent   Fibromyalgia    GERD (gastroesophageal reflux disease)    Heart murmur    History of blood transfusion    History of cocaine abuse (HCC)    Remote history    History of kidney stones    Hyperlipidemia    Hypertension    takes meds daily   Hypothyroidism    Lupus Anticoagulant Positive    On home oxygen  therapy    at night   Seizure (HCC)    no seizure in 1.5-2 years per patient (12/17/23)   Shortness of breath    exertion or lying flat   Sleep apnea    2l of oxygen  at night (as of 12/6, she used to)- wears CPAP   Past Surgical History:  Procedure Laterality Date   ABDOMINAL HYSTERECTOMY  1989   Fibroids   ANTERIOR LUMBAR FUSION  01/03/2012   Procedure: ANTERIOR LUMBAR FUSION 1 LEVEL;  Surgeon: Rockey LITTIE Peru, MD;  Location: [facility];  Service: Neurosurgery;  Laterality: N/A;  Lumbar Four-Five Anterior Lumbar Interbody Fusion with Instrumentation   BACK SURGERY     cervical spine---disk disease   CARPAL TUNNEL RELEASE     Bilateral   CESAREAN SECTION     CHOLECYSTECTOMY     CYSTOSCOPY/RETROGRADE/URETEROSCOPY/STONE EXTRACTION WITH BASKET Right 04/26/2014   Procedure: CYSTOSCOPY/ RIGHT RETROGRADE/ RIGHT URETEROSCOPY/URETERAL AND RENAL PELVIS BIOPSY;  Surgeon: Ricardo Likens, MD;  Location: [facility];  Service: Urology;  Laterality: Right;   ESOPHAGOGASTRODUODENOSCOPY (EGD) WITH PROPOFOL  N/A 08/25/2022   Procedure: ESOPHAGOGASTRODUODENOSCOPY (EGD) WITH PROPOFOL ;  Surgeon: Rollin Dover, MD;  Location: [facility];  Service: Gastroenterology;  Laterality: N/A;   gallstones removed     HEMOSTASIS CLIP PLACEMENT  08/25/2022   Procedure: HEMOSTASIS CLIP PLACEMENT;  Surgeon: Rollin Dover, MD;  Location: [facility];  Service: Gastroenterology;;   HOT HEMOSTASIS N/A 08/25/2022   Procedure: HOT HEMOSTASIS (ARGON PLASMA COAGULATION/BICAP);  Surgeon: Rollin Dover, MD;  Location: [facility];  Service: Gastroenterology;  Laterality: N/A;    POLYPECTOMY  08/25/2022   Procedure: POLYPECTOMY;  Surgeon: Rollin Dover, MD;  Location: [facility];  Service: Gastroenterology;;   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   bilateral   right elbow surgery     RIGHT HEART CATH N/A 02/28/2021   Procedure: RIGHT HEART CATH;  Surgeon: Verlin Lonni BIRCH, MD;  Location: [facility];  Service: Cardiovascular;  Laterality: N/A;   THYROID  LOBECTOMY Right 02/27/2017   THYROID  LOBECTOMY Right 02/27/2017   Procedure: RIGHT THYROID  LOBECTOMY;  Surgeon: Vanderbilt Ned, MD;  Location: [facility];  Service: General;  Laterality: Right;   TUBAL LIGATION     Social History   Tobacco Use   Smoking status: Never   Smokeless tobacco: Never  Vaping Use   Vaping status: Never Used  Substance Use Topics   Alcohol use: No    Alcohol/week: 0.0 standard drinks of alcohol    Comment: beer in past    Drug use: Not Currently    Types: Cocaine    Comment: quit 2003-rehab progam in 2005   Family History  Problem Relation Age  of Onset   Hypertension Mother    Diabetes Mother    Cancer Mother        Pancreatic   Hypertension Father    Heart failure Father    Hyperlipidemia Other    COPD Other    Allergies  Allergen Reactions   Lisinopril  Anaphylaxis, Swelling and Other (See Comments)    THROAT SWELLING, Pt states her throat started to close up after being on it for so long.    Prednisone  Anaphylaxis, Swelling and Other (See Comments)    Throat swelling and tongue irritation   Spironolactone  Other (See Comments)    Caused stroke-like symptoms with the mouth   Corticosteroids Rash    States she developed a rash on her tongue after a steroid injection in her back    ROS Negative unless stated above    Objective:     BP 118/68 (BP Location: Right Arm, Patient Position: Sitting, Cuff Size: Large)   Pulse 99   Temp 98.6 F (37 C) (Oral)   Ht 5' 6 (1.676 m)   Wt 283 lb 6.4 oz (128.5 kg)   SpO2 97%   BMI 45.74 kg/m  BP Readings  from Last 3 Encounters:  02/06/24 118/68  01/15/24 137/73  12/17/23 (!) 144/87   Wt Readings from Last 3 Encounters:  02/06/24 283 lb 6.4 oz (128.5 kg)  01/11/24 276 lb (125.2 kg)  12/17/23 278 lb 11.2 oz (126.4 kg)      Physical Exam Constitutional:      General: She is not in acute distress.    Appearance: Normal appearance.  HENT:     Head: Normocephalic and atraumatic.     Nose: Nose normal.     Mouth/Throat:     Mouth: Mucous membranes are moist.  Cardiovascular:     Rate and Rhythm: Normal rate and regular rhythm.     Heart sounds: Normal heart sounds. No murmur heard.    No gallop.  Pulmonary:     Effort: Pulmonary effort is normal. No respiratory distress.     Breath sounds: Normal breath sounds. No wheezing, rhonchi or rales.  Musculoskeletal:     Comments: Healing surgical incision with edema surrounding. No erythema.  Skin:    General: Skin is warm and dry.  Neurological:     Mental Status: She is alert and oriented to person, place, and time.        02/06/2024   10:51 AM 09/27/2023   10:34 AM 08/22/2023   11:34 AM  Depression screen PHQ 2/9  Decreased Interest 0 3 1  Down, Depressed, Hopeless 1 0 1  PHQ - 2 Score 1 3 2   Altered sleeping 1 3 0  Tired, decreased energy 3 3 2   Change in appetite 1 3 2   Feeling bad or failure about yourself  1 0 1  Trouble concentrating 3 3 2   Moving slowly or fidgety/restless 1 3 2   Suicidal thoughts 0 0 0  PHQ-9 Score 11 18  11    Difficult doing work/chores Somewhat difficult       Data saved with a previous flowsheet row definition      02/06/2024   10:51 AM 09/27/2023   10:35 AM 08/22/2023   11:35 AM 07/11/2023   10:33 AM  GAD 7 : Generalized Anxiety Score  Nervous, Anxious, on Edge 1 1 1 1   Control/stop worrying 1 3 1 1   Worry too much - different things 1 3 1 1   Trouble relaxing 1 3 1  1  Restless 0 1 0 0  Easily annoyed or irritable 1 0 1 1  Afraid - awful might happen 0 0 2 0  Total GAD 7 Score 5 11 7 5    Anxiety Difficulty Somewhat difficult Somewhat difficult  Somewhat difficult     No results found for any visits on 02/06/24.    Assessment & Plan:   Type 2 diabetes mellitus with diabetic neuropathy, without long-term current use of insulin  (HCC)  Edema of back  Spondylolisthesis of lumbar region  History of lumbar spinal fusion  Essential hypertension  OSA on CPAP  Encounter for screening mammogram for malignant neoplasm of breast -     3D Screening Mammogram, Left and Right; Future  Fatigue, unspecified type  DM controlled.  Hgb A1C 6.5%.  Continue lifestyle modifications and current medications.  Patient with history of spondylolisthesis of lumbar region status post lumbar laminectomy 01/11/2024.  Now with edema surrounding surgical site.  Advised to contact Ortho regarding this.  Recurrent fatigue possibly related to inability to use CPAP for OSA.  Also consider infection given recent edema of back at surgical site.  Continue follow-up with pulm and Ortho.  HTN controlled.  Continue current medications including spironolactone  12.5 mg daily, Cardizem  CD2 140 mg daily.  Lifestyle modification strongly encouraged.  Continue monitoring BP at home and keep a log of readings.  Patient initially reluctant but agrees to breast cancer screening.  Mammogram order placed.   Return in about 4 months (around 06/05/2024), or if symptoms worsen or fail to improve.   Clotilda JONELLE Single, MD

## 2024-02-25 LAB — OPHTHALMOLOGY REPORT-SCANNED

## 2024-02-26 ENCOUNTER — Inpatient Hospital Stay

## 2024-02-26 ENCOUNTER — Inpatient Hospital Stay: Attending: Oncology | Admitting: Oncology

## 2024-02-26 ENCOUNTER — Telehealth: Payer: Self-pay | Admitting: *Deleted

## 2024-02-26 VITALS — BP 138/76 | HR 90 | Temp 97.8°F | Resp 18 | Ht 66.0 in | Wt 283.0 lb

## 2024-02-26 DIAGNOSIS — I82403 Acute embolism and thrombosis of unspecified deep veins of lower extremity, bilateral: Secondary | ICD-10-CM | POA: Diagnosis not present

## 2024-02-26 DIAGNOSIS — Z7901 Long term (current) use of anticoagulants: Secondary | ICD-10-CM | POA: Diagnosis not present

## 2024-02-26 DIAGNOSIS — D649 Anemia, unspecified: Secondary | ICD-10-CM

## 2024-02-26 DIAGNOSIS — D6862 Lupus anticoagulant syndrome: Secondary | ICD-10-CM | POA: Diagnosis not present

## 2024-02-26 DIAGNOSIS — Z86718 Personal history of other venous thrombosis and embolism: Secondary | ICD-10-CM | POA: Diagnosis present

## 2024-02-26 DIAGNOSIS — Z862 Personal history of diseases of the blood and blood-forming organs and certain disorders involving the immune mechanism: Secondary | ICD-10-CM | POA: Insufficient documentation

## 2024-02-26 LAB — CBC (CANCER CENTER ONLY)
HCT: 36.3 % (ref 36.0–46.0)
Hemoglobin: 11.4 g/dL — ABNORMAL LOW (ref 12.0–15.0)
MCH: 25.3 pg — ABNORMAL LOW (ref 26.0–34.0)
MCHC: 31.4 g/dL (ref 30.0–36.0)
MCV: 80.5 fL (ref 80.0–100.0)
Platelet Count: 398 K/uL (ref 150–400)
RBC: 4.51 MIL/uL (ref 3.87–5.11)
RDW: 18.5 % — ABNORMAL HIGH (ref 11.5–15.5)
WBC Count: 6.1 K/uL (ref 4.0–10.5)
nRBC: 0 % (ref 0.0–0.2)

## 2024-02-26 LAB — PROTIME-INR
INR: 2.3 — ABNORMAL HIGH (ref 0.8–1.2)
Prothrombin Time: 26.2 s — ABNORMAL HIGH (ref 11.4–15.2)

## 2024-02-26 LAB — FERRITIN: Ferritin: 35 ng/mL (ref 11–307)

## 2024-02-26 NOTE — Progress Notes (Signed)
  [facility] OFFICE PROGRESS NOTE   Diagnosis: History of venous thrombosis, chronic anticoagulation  INTERVAL HISTORY:   Ms. Modeste returns as scheduled.  She continues Coumadin .  No bleeding or symptom of recurrent thrombosis.  She underwent lumbar laminectomy for treatment of spinal stenosis on 01/11/2024.  She is followed by Dr. Gillie.  She reports Coumadin  was held for 1 week prior to the procedure.  Objective:  Vital signs in last 24 hours:  Blood pressure 138/76, pulse 90, temperature 97.8 F (36.6 C), temperature source Temporal, resp. rate 18, height 5' 6 (1.676 m), weight 283 lb (128.4 kg), SpO2 100%.  Resp: Lungs clear bilaterally Cardio: Regular rate and rhythm GI: No hepatosplenomegaly Vascular: No leg edema or erythema  Lab Results:  Lab Results  Component Value Date   WBC 6.1 12/17/2023   HGB 12.0 12/17/2023   HCT 39.4 12/17/2023   MCV 83.1 12/17/2023   PLT 375 12/17/2023   NEUTROABS 4.5 08/27/2023    CMP  Lab Results  Component Value Date   NA 138 12/17/2023   K 3.7 12/17/2023   CL 101 12/17/2023   CO2 26 12/17/2023   GLUCOSE 129 (H) 12/17/2023   BUN 15 12/17/2023   CREATININE 1.02 (H) 12/17/2023   CALCIUM  8.6 (L) 12/17/2023   PROT 8.7 (H) 08/22/2023   ALBUMIN  4.5 08/22/2023   AST 14 08/22/2023   ALT 15 08/22/2023   ALKPHOS 80 08/22/2023   BILITOT 0.3 08/22/2023   GFRNONAA 60 (L) 12/17/2023   GFRAA 94 03/05/2020    Medications: I have reviewed the patient's current medications.   Assessment/Plan: History of bilateral lower extremity deep vein thrombosis. She continues Coumadin  anticoagulation. History of a positive lupus anticoagulant. History of a positive beta-2 -glycoprotein IgA antibody. Chronic bilateral leg pain - negative bilateral Doppler January 2010. History of hypertension. COPD Status post hysterectomy. Remote history of cocaine abuse. History of an adrenal lesion on a CT scan in 2008 - status post a repeat  CT on June 16, 2009, showing slow enlargement of the right adrenal nodule since 2006, most consistent with an adenoma. Stable on a CT 04/24/2014 History of rectal and vaginal bleeding in May 2009.   Status post carpal tunnel surgery. 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 Right knee replacement September 2011. Multiple orthopedic conditions. Placement of IVC filter preoperatively before knee replacement. The IVC filter was retrieved 03/01/2011. Anemia. Ferritin in normal range at 82 on 08/18/2013; 61 on 07/10/2016 Status post lumbar fusion surgery October 2013 Right hydronephrosis-evaluated by Dr. Alvaro Left ankle swelling/pain 07/24/2016 Mild anemia noted on hospital admission 2018 Colonoscopy 04/19/2017-entire examined colon normal.  Repeat colonoscopy in 10 years for surveillance. Admission with COVID-19 infection 03/09/2019 Admission 08/23/2022 with severe anemia and supratherapeutic PT/INR Upper endoscopy 08/25/2022: Bleeding angioectasia in the duodenum, gastric polyps with bleeding were removed-fundic gland polyp and hyperplastic polyps 23.  Admission 07/22/2023 with a supratherapeutic PT/INR and anemia 24.  01/11/2024 lumbar laminectomy for treatment of spinal stenosis      Disposition: Ms. Traynham appears stable.  She continues chronic Coumadin  anticoagulation.  There is no recurrent venous thrombosis.  We will check the PT/INR today.  She will return for a lab visit in 1 month and an office visit in 6 months.  Arley Hof, MD  02/26/2024  11:54 AM

## 2024-02-26 NOTE — Telephone Encounter (Signed)
 Notified of therapeutic INR and to continue warfarin 10 mg MWF and 7.5 mg all other days. Ferritin is improving and CBC is stable. F/U as scheduled.

## 2024-03-07 ENCOUNTER — Other Ambulatory Visit: Payer: Self-pay | Admitting: Family Medicine

## 2024-03-10 ENCOUNTER — Ambulatory Visit (INDEPENDENT_AMBULATORY_CARE_PROVIDER_SITE_OTHER): Payer: 59

## 2024-03-10 VITALS — BP 134/62 | HR 88 | Temp 98.1°F | Ht 66.0 in | Wt 284.3 lb

## 2024-03-10 DIAGNOSIS — Z0000 Encounter for general adult medical examination without abnormal findings: Secondary | ICD-10-CM | POA: Diagnosis not present

## 2024-03-10 NOTE — Patient Instructions (Addendum)
 [name],  Thank you for taking the time for your Medicare Wellness Visit. I appreciate your continued commitment to your health goals. Please review the care plan we discussed, and feel free to reach out if I can assist you further.  Please note that Annual Wellness Visits do not include a physical exam. Some assessments may be limited, especially if the visit was conducted virtually. If needed, we may recommend an in-person follow-up with your provider.  Ongoing Care Seeing your primary care provider every 3 to 6 months helps us  monitor your health and provide consistent, personalized care.   Referrals If a referral was made during today's visit and you haven't received any updates within two weeks, please contact the referred provider directly to check on the status.  Recommended Screenings:  Health Maintenance  Topic Date Due   Hepatitis C Screening  Never done   DTaP/Tdap/Td vaccine (1 - Tdap) Never done   Zoster (Shingles) Vaccine (1 of 2) Never done   Pneumococcal Vaccine for age over 65 (2 of 2 - PCV) 01/07/2013   Breast Cancer Screening  09/30/2021   COVID-19 Vaccine (5 - 2025-26 season) 11/19/2023   Flu Shot  06/23/2024*   Complete foot exam   05/29/2024   Hemoglobin A1C  06/15/2024   Yearly kidney health urinalysis for diabetes  09/26/2024   Yearly kidney function blood test for diabetes  12/16/2024   Eye exam for diabetics  02/24/2025   Medicare Annual Wellness Visit  03/10/2025   Colon Cancer Screening  04/20/2027   Osteoporosis screening with Bone Density Scan  Completed   Meningitis B Vaccine  Aged Out  *Topic was postponed. The date shown is not the original due date.   Opioid Pain Medicine Management Opioids are powerful medicines that are used to treat moderate to severe pain. When used for short periods of time, they can help you to: Sleep better. Do better in physical or occupational therapy. Feel better in the first few days after an injury. Recover from  surgery. Opioids should be taken with the supervision of a trained health care provider. They should be taken for the shortest period of time possible. This is because opioids can be addictive, and the longer you take opioids, the greater your risk of addiction. This addiction can also be called opioid use disorder. What are the risks? Using opioid pain medicines for longer than 3 days increases your risk of side effects. Side effects include: Constipation. Nausea and vomiting. Breathing difficulties (respiratory depression). Drowsiness. Confusion. Opioid use disorder. Itching. Taking opioid pain medicine for a long period of time can affect your ability to do daily tasks. It also puts you at risk for: Motor vehicle crashes. Depression. Suicide. Heart attack. Overdose, which can be life-threatening. What is a pain treatment plan? A pain treatment plan is an agreement between you and your health care provider. Pain is unique to each person, and treatments vary depending on your condition. To manage your pain, you and your health care provider need to work together. To help you do this: Discuss the goals of your treatment, including how much pain you might expect to have and how you will manage the pain. Review the risks and benefits of taking opioid medicines. Remember that a good treatment plan uses more than one approach and minimizes the chance of side effects. Be honest about the amount of medicines you take and about any drug or alcohol use. Get pain medicine prescriptions from only one health care provider.  Pain can be managed with many types of alternative treatments. Ask your health care provider to refer you to one or more specialists who can help you manage pain through: Physical or occupational therapy. Counseling (cognitive behavioral therapy). Good nutrition. Biofeedback. Massage. Meditation. Non-opioid medicine. Following a gentle exercise program. How to use opioid pain  medicine Taking medicine Take your pain medicine exactly as told by your health care provider. Take it only when you need it. If your pain gets less severe, you may take less than your prescribed dose if your health care provider approves. If you are not having pain, do nottake pain medicine unless your health care provider tells you to take it. If your pain is severe, do nottry to treat it yourself by taking more pills than instructed on your prescription. Contact your health care provider for help. Write down the times when you take your pain medicine. It is easy to become confused while on pain medicine. Writing the time can help you avoid overdose. Take other over-the-counter or prescription medicines only as told by your health care provider. Keeping yourself and others safe  While you are taking opioid pain medicine: Do not drive, use machinery, or power tools. Do not sign legal documents. Do not drink alcohol. Do not take sleeping pills. Do not supervise children by yourself. Do not do activities that require climbing or being in high places. Do not go to a lake, river, ocean, spa, or swimming pool. Do not share your pain medicine with anyone. Keep pain medicine in a locked cabinet or in a secure area where pets and children cannot reach it. Stopping your use of opioids If you have been taking opioid medicine for more than a few weeks, you may need to slowly decrease (taper) how much you take until you stop completely. Tapering your use of opioids can decrease your risk of symptoms of withdrawal, such as: Pain and cramping in the abdomen. Nausea. Sweating. Sleepiness. Restlessness. Uncontrollable shaking (tremors). Cravings for the medicine. Do not attempt to taper your use of opioids on your own. Talk with your health care provider about how to do this. Your health care provider may prescribe a step-down schedule based on how much medicine you are taking and how long you have  been taking it. Getting rid of leftover pills Do not save any leftover pills. Get rid of leftover pills safely by: Taking the medicine to a prescription take-back program. This is usually offered by the county or law enforcement. Bringing them to a pharmacy that has a drug disposal container. Flushing them down the toilet. Check the label or package insert of your medicine to see whether this is safe to do. Throwing them out in the trash. Check the label or package insert of your medicine to see whether this is safe to do. If it is safe to throw it out, remove the medicine from the original container, put it into a sealable bag or container, and mix it with used coffee grounds, food scraps, dirt, or cat litter before putting it in the trash. Follow these instructions at home: Activity Do exercises as told by your health care provider. Avoid activities that make your pain worse. Return to your normal activities as told by your health care provider. Ask your health care provider what activities are safe for you. General instructions You may need to take these actions to prevent or treat constipation: Drink enough fluid to keep your urine pale yellow. Take over-the-counter or prescription medicines.  Eat foods that are high in fiber, such as beans, whole grains, and fresh fruits and vegetables. Limit foods that are high in fat and processed sugars, such as fried or sweet foods. Keep all follow-up visits. This is important. Where to find support If you have been taking opioids for a long time, you may benefit from receiving support for quitting from a local support group or counselor. Ask your health care provider for a referral to these resources in your area. Where to find more information Centers for Disease Control and Prevention (CDC): footballexhibition.com.br [place] Food and Drug Administration (FDA): pumpkinsearch.com.ee Get help right away if: You may have taken too much of an opioid (overdosed). Common  symptoms of an overdose: Your breathing is slower or more shallow than normal. You have a very slow heartbeat (pulse). You have slurred speech. You have nausea and vomiting. Your pupils become very small. You have other potential symptoms: You are very confused. You faint or feel like you will faint. You have cold, clammy skin. You have blue lips or fingernails. You have thoughts of harming yourself or harming others. These symptoms may represent a serious problem that is an emergency. Do not wait to see if the symptoms will go away. Get medical help right away. Call your local emergency services (911 in the [place] Do not drive yourself to the hospital.  If you ever feel like you may hurt yourself or others, or have thoughts about taking your own life, get help right away. Go to your nearest emergency department or: Call your local emergency services (911 in the [place] Call the [facility] ((570)182-9527 in the [place] Call a suicide crisis helpline, such as the National Suicide Prevention Lifeline at (252)372-2854 or 988 in the [place] This is open 24 hours a day in the [place] If youre a Veteran: Call 988 and press 1. This is open 24 hours a day. Text the [employer] at 919-143-9498. Summary Opioid medicines can help you manage moderate to severe pain for a short period of time. A pain treatment plan is an agreement between you and your health care provider. Discuss the goals of your treatment, including how much pain you might expect to have and how you will manage the pain. If you think that you or someone else may have taken too much of an opioid, get medical help right away. This information is not intended to replace advice given to you by your health care provider. Make sure you discuss any questions you have with your health care provider. Document Revised: 12/11/2022 Document Reviewed: 06/16/2020 [employer] Patient Education  2024 [employer]    03/10/2024     1:14 PM  Advanced Directives  Does Patient Have a Medical Advance Directive? No  Would patient like information on creating a medical advance directive? No - Patient declined    Vision: Annual vision screenings are recommended for early detection of glaucoma, cataracts, and diabetic retinopathy. These exams can also reveal signs of chronic conditions such as diabetes and high blood pressure.  Dental: Annual dental screenings help detect early signs of oral cancer, gum disease, and other conditions linked to overall health, including heart disease and diabetes.  Please see the attached documents for additional preventive care recommendations.

## 2024-03-10 NOTE — Progress Notes (Signed)
 "  Chief Complaint  Patient presents with   Medicare Wellness     Subjective:   [name] is a 68 y.o. female who presents for a Medicare Annual Wellness Visit.  Visit info / Clinical Intake: Medicare Wellness Visit Type:: Subsequent Annual Wellness Visit Persons participating in visit and providing information:: patient Medicare Wellness Visit Mode:: In-person (required for WTM) Interpreter Needed?: No Pre-visit prep was completed: yes AWV questionnaire completed by patient prior to visit?: no Living arrangements:: (!) lives alone Patient's Overall Health Status Rating: (!) fair Typical amount of pain: none Does pain affect daily life?: no Are you currently prescribed opioids?: (!) yes  Dietary Habits and Nutritional Risks How many meals a day?: 2 Eats fruit and vegetables daily?: yes Most meals are obtained by: preparing own meals; having others provide food In the last 2 weeks, have you had any of the following?: none Diabetic:: (!) yes Any non-healing wounds?: no How often do you check your BS?: as needed Would you like to be referred to a Nutritionist or for Diabetic Management? : no  Functional Status Activities of Daily Living (to include ambulation/medication): (!) Needs Assist (Aide assist) Feeding: Independent Dressing/Grooming: Needs assistance (Aide assist) Bathing: Needs assistance (Aide assist) Toileting: Independent Transfer: Independent Ambulation: Independent with device- listed below Home Assistive Devices/Equipment: Eyeglasses; Walker (specify Type); Shower/tub chair; Medical Laboratory Scientific Officer; Dentures (specify type); CPAP; Nebulizers Advertising Copywriter) Medication Administration: Independent Home Management (perform basic housework or laundry): Needs assistance (comment) (Aide assist) Manage your own finances?: yes Primary transportation is: driving; family / friends Concerns about vision?: no *vision screening is required for WTM* Concerns about hearing?:  no  Fall Screening Falls in the past year?: 1 Number of falls in past year: 1 Was there an injury with Fall?: 0 Fall Risk Category Calculator: 2 Patient Fall Risk Level: Moderate Fall Risk  Fall Risk Patient at Risk for Falls Due to: Impaired balance/gait Fall risk Follow up: Falls evaluation completed; Education provided  Home and Transportation Safety: All rugs have non-skid backing?: N/A, no rugs All stairs or steps have railings?: yes Grab bars in the bathtub or shower?: yes Have non-skid surface in bathtub or shower?: yes Good home lighting?: yes Regular seat belt use?: yes Hospital stays in the last year:: (!) yes How many hospital stays:: 3 Reason: Back surgery  Cognitive Assessment Difficulty concentrating, remembering, or making decisions? : yes Will 6CIT or Mini Cog be Completed: yes What year is it?: 0 points What month is it?: 0 points Give patient an address phrase to remember (5 components): [address] [name]  About what time is it?: 0 points Count backwards from 20 to 1: 0 points Say the months of the year in reverse: 0 points Repeat the address phrase from earlier: 0 points 6 CIT Score: 0 points  Advance Directives (For Healthcare) Does Patient Have a Medical Advance Directive?: No Would patient like information on creating a medical advance directive?: No - Patient declined  Reviewed/Updated  Reviewed/Updated: Reviewed All (Medical, Surgical, Family, Medications, Allergies, Care Teams, Patient Goals)    Allergies (verified) Lisinopril , Prednisone , Spironolactone , and Corticosteroids   Current Medications (verified) Outpatient Encounter Medications as of 03/10/2024  Medication Sig   acetaminophen  (TYLENOL ) 500 MG tablet Take 500-1,000 mg by mouth every 6 (six) hours as needed for moderate pain (pain score 4-6).   albuterol  (ACCUNEB ) 0.63 MG/3ML nebulizer solution Take 3 mLs (0.63 mg total) by nebulization every 6 (six) hours as needed for  wheezing.   albuterol  (  VENTOLIN  HFA) 108 (90 Base) MCG/ACT inhaler INHALE TWO (2) PUFFS BY MOUTH EVERY 4 HOURS AS NEEDED FOR SHORTNESS OF BREATH   ALLERGY RELIEF 180 MG tablet Take 180 mg by mouth daily.   Blood Glucose Monitoring Suppl (ACCU-CHEK GUIDE) w/Device KIT USE TO TEST BLOOD SUGAR 3 TIMES DAILY   canagliflozin  (INVOKANA ) 100 MG TABS tablet Take 1 tablet (100 mg total) by mouth daily before breakfast.   cloNIDine  (CATAPRES ) 0.1 MG tablet TAKE ONE (1) TABLET BY MOUTH TWICE DAILY BEFORE BREAKFAST AND AT BEDTIME   diclofenac  sodium (VOLTAREN ) 1 % GEL Apply 2 g topically daily as needed (fibromyalgia pain- affected sites).   diltiazem  (CARDIZEM  CD) 240 MG 24 hr capsule TAKE 1 CAPSULE BY MOUTH DAILY BEFORE BREAKFAST   DULoxetine  (CYMBALTA ) 30 MG capsule Take 90 mg by mouth at bedtime.   FEROSUL 325 (65 Fe) MG tablet TAKE ONE TABLET BY MOUTH ONCE DAILY (Patient not taking: Reported on 02/26/2024)   fluticasone  (FLONASE ) 50 MCG/ACT nasal spray Place 2 sprays into both nostrils daily as needed for allergies.   furosemide  (LASIX ) 80 MG tablet Take 0.5 tablets (40 mg total) by mouth daily.   gabapentin  (NEURONTIN ) 300 MG capsule TAKE ONE (1) CAPSULE BY MOUTH TWICE DAILY   lidocaine  (LIDODERM ) 5 % APPLY 1 PATCH TOPICALLY ON THE SKIN DAILY AS NEEDED TO PAINFUL SITE *12 HOURS ON AND 12 HOURS OFF OR AS DIRECTED BY MD*   LINZESS  145 MCG CAPS capsule Take 1 capsule (145 mcg total) by mouth every other day.   metFORMIN  (GLUCOPHAGE ) 500 MG tablet TAKE 1 TABLET BY MOUTH EVERY DAY AT BEDTIME   omeprazole  (PRILOSEC) 20 MG capsule TAKE 1 CAPSULE BY MOUTH TWICE DAILY BEFORE A MEAL   oxyCODONE  (OXY IR/ROXICODONE ) 5 MG immediate release tablet Take 1 tablet (5 mg total) by mouth every 4 (four) hours as needed for moderate pain (pain score 4-6).   OZEMPIC , 2 MG/DOSE, 8 MG/3ML SOPN INJECT 2MG  SUBCUTANEOUSLY ONCE WEEKLY   potassium chloride  (MICRO-K ) 10 MEQ CR capsule TAKE TWO (2) CAPSULES BY MOUTH EVERY DAY AT  BEDTIME   QUEtiapine  (SEROQUEL ) 100 MG tablet Take 150 mg by mouth at bedtime.   rosuvastatin  (CRESTOR ) 10 MG tablet TAKE 1 TABLET BY MOUTH DAILY   spironolactone  (ALDACTONE ) 25 MG tablet Take 0.5 tablets (12.5 mg total) by mouth daily.   SYMBICORT  160-4.5 MCG/ACT inhaler INHALE TWO (2) PUFFS BY MOUTH TWICE DAILY   tiZANidine  (ZANAFLEX ) 4 MG tablet Take 1 tablet (4 mg total) by mouth every 6 (six) hours as needed for muscle spasms.   warfarin (COUMADIN ) 10 MG tablet TAKE DAILY AS DIRECTED BY MD   warfarin (COUMADIN ) 7.5 MG tablet Take 1 tablet (7.5 mg total) by mouth See admin instructions. Take 7.5mg  (1 tablet) by mouth on Sunday, Tuesday, Thursday, and Saturday.   [DISCONTINUED] zonisamide  (ZONEGRAN ) 100 MG capsule TAKE 1 CAPSULE BY MOUTH EACH MORNING AND TAKE 2 CAPSULES IN THE EVENING   Facility-Administered Encounter Medications as of 03/10/2024  Medication   sodium chloride  flush (NS) 0.9 % injection 3 mL    History: Past Medical History:  Diagnosis Date   Adrenal mass 03/226   Benign   Arthritis    knees/multiple orthopedic conditons; lower back   Asthma    per pt   Clotting disorder    +beta-2 -glycoprotein IgA antibody   COPD (chronic obstructive pulmonary disease) (HCC)    inhalers dependent on environment   Depression    Diabetes mellitus    120s  usually fasting -  dx more than 10 yrs ago   Dizziness, nonspecific    DVT (deep venous thrombosis) (HCC)    Recurrent   Fibromyalgia    GERD (gastroesophageal reflux disease)    Heart murmur    History of blood transfusion    History of cocaine abuse (HCC)    Remote history    History of kidney stones    Hyperlipidemia    Hypertension    takes meds daily   Hypothyroidism    Lupus Anticoagulant Positive    On home oxygen  therapy    at night   Seizure (HCC)    no seizure in 1.5-2 years per patient (12/17/23)   Shortness of breath    exertion or lying flat   Sleep apnea    2l of oxygen  at night (as of 12/6, she used  to)- wears CPAP   Past Surgical History:  Procedure Laterality Date   ABDOMINAL HYSTERECTOMY  1989   Fibroids   ANTERIOR LUMBAR FUSION  01/03/2012   Procedure: ANTERIOR LUMBAR FUSION 1 LEVEL;  Surgeon: Rockey LITTIE Peru, MD;  Location: [facility];  Service: Neurosurgery;  Laterality: N/A;  Lumbar Four-Five Anterior Lumbar Interbody Fusion with Instrumentation   BACK SURGERY     cervical spine---disk disease   CARPAL TUNNEL RELEASE     Bilateral   CESAREAN SECTION     CHOLECYSTECTOMY     CYSTOSCOPY/RETROGRADE/URETEROSCOPY/STONE EXTRACTION WITH BASKET Right 04/26/2014   Procedure: CYSTOSCOPY/ RIGHT RETROGRADE/ RIGHT URETEROSCOPY/URETERAL AND RENAL PELVIS BIOPSY;  Surgeon: Ricardo Likens, MD;  Location: [facility];  Service: Urology;  Laterality: Right;   ESOPHAGOGASTRODUODENOSCOPY (EGD) WITH PROPOFOL  N/A 08/25/2022   Procedure: ESOPHAGOGASTRODUODENOSCOPY (EGD) WITH PROPOFOL ;  Surgeon: Rollin Dover, MD;  Location: [facility];  Service: Gastroenterology;  Laterality: N/A;   gallstones removed     HEMOSTASIS CLIP PLACEMENT  08/25/2022   Procedure: HEMOSTASIS CLIP PLACEMENT;  Surgeon: Rollin Dover, MD;  Location: [facility];  Service: Gastroenterology;;   HOT HEMOSTASIS N/A 08/25/2022   Procedure: HOT HEMOSTASIS (ARGON PLASMA COAGULATION/BICAP);  Surgeon: Rollin Dover, MD;  Location: [facility];  Service: Gastroenterology;  Laterality: N/A;   POLYPECTOMY  08/25/2022   Procedure: POLYPECTOMY;  Surgeon: Rollin Dover, MD;  Location: [facility];  Service: Gastroenterology;;   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   bilateral   right elbow surgery     RIGHT HEART CATH N/A 02/28/2021   Procedure: RIGHT HEART CATH;  Surgeon: Verlin Lonni BIRCH, MD;  Location: [facility];  Service: Cardiovascular;  Laterality: N/A;   THYROID  LOBECTOMY Right 02/27/2017   THYROID  LOBECTOMY Right 02/27/2017   Procedure: RIGHT THYROID  LOBECTOMY;  Surgeon: Vanderbilt Ned, MD;  Location: [facility];  Service: General;   Laterality: Right;   TUBAL LIGATION     Family History  Problem Relation Age of Onset   Hypertension Mother    Diabetes Mother    Cancer Mother        Pancreatic   Hypertension Father    Heart failure Father    Hyperlipidemia Other    COPD Other    Social History   Occupational History   Not on file  Tobacco Use   Smoking status: Never   Smokeless tobacco: Never  Vaping Use   Vaping status: Never Used  Substance and Sexual Activity   Alcohol use: No    Alcohol/week: 0.0 standard drinks of alcohol    Comment: beer in past    Drug use: Not Currently  Types: Cocaine    Comment: quit 2003-rehab progam in 2005   Sexual activity: Never   Tobacco Counseling Counseling given: No  SDOH Screenings   Food Insecurity: No Food Insecurity (03/10/2024)  Housing: Unknown (03/10/2024)  Transportation Needs: No Transportation Needs (03/10/2024)  Utilities: Not At Risk (03/10/2024)  Alcohol Screen: Low Risk (03/07/2023)  Depression (PHQ2-9): Low Risk (03/10/2024)  Recent Concern: Depression (PHQ2-9) - High Risk (02/06/2024)  Financial Resource Strain: Low Risk (03/07/2023)  Physical Activity: Insufficiently Active (03/10/2024)  Social Connections: Moderately Isolated (03/10/2024)  Stress: Stress Concern Present (03/10/2024)  Tobacco Use: Low Risk (03/10/2024)  Health Literacy: Adequate Health Literacy (03/10/2024)   See flowsheets for full screening details  Depression Screen PHQ 2 & 9 Depression Scale- Over the past 2 weeks, how often have you been bothered by any of the following problems? Little interest or pleasure in doing things: 1 Feeling down, depressed, or hopeless (PHQ Adolescent also includes...irritable): 1 PHQ-2 Total Score: 2 Trouble falling or staying asleep, or sleeping too much: 0 Feeling tired or having little energy: 0 Poor appetite or overeating (PHQ Adolescent also includes...weight loss): 1 Feeling bad about yourself - or that you are a failure or  have let yourself or your family down: 0 Trouble concentrating on things, such as reading the newspaper or watching television (PHQ Adolescent also includes...like school work): 0 Moving or speaking so slowly that other people could have noticed. Or the opposite - being so fidgety or restless that you have been moving around a lot more than usual: 1 Thoughts that you would be better off dead, or of hurting yourself in some way: 0 PHQ-9 Total Score: 4 If you checked off any problems, how difficult have these problems made it for you to do your work, take care of things at home, or get along with other people?: Somewhat difficult  Depression Treatment Depression Interventions/Treatment : Currently on Treatment     Goals Addressed               This Visit's Progress     Increase physical activity (pt-stated)               Objective:    Today's Vitals   03/10/24 1255  BP: 134/62  Pulse: 88  Temp: 98.1 F (36.7 C)  TempSrc: Oral  SpO2: 95%  Weight: 284 lb 4.8 oz (129 kg)  Height: 5' 6 (1.676 m)   Body mass index is 45.89 kg/m.  Hearing/Vision screen Hearing Screening - Comments:: Denies hearing difficulties   Vision Screening - Comments:: Wears rx glasses - up to date with routine eye exams with  Dr Elner Immunizations and Health Maintenance Health Maintenance  Topic Date Due   Hepatitis C Screening  Never done   DTaP/Tdap/Td (1 - Tdap) Never done   Zoster Vaccines- Shingrix (1 of 2) Never done   Pneumococcal Vaccine: 50+ Years (2 of 2 - PCV) 01/07/2013   Mammogram  09/30/2021   COVID-19 Vaccine (5 - 2025-26 season) 11/19/2023   Influenza Vaccine  06/23/2024 (Originally 10/19/2023)   FOOT EXAM  05/29/2024   HEMOGLOBIN A1C  06/15/2024   Diabetic kidney evaluation - Urine ACR  09/26/2024   Diabetic kidney evaluation - eGFR measurement  12/16/2024   OPHTHALMOLOGY EXAM  02/24/2025   Medicare Annual Wellness (AWV)  03/10/2025   Colonoscopy  04/20/2027   Bone  Density Scan  Completed   Meningococcal B Vaccine  Aged Out        Assessment/Plan:  This  is a routine wellness examination for [facility].  Patient Care Team: Mercer Clotilda SAUNDERS, MD as PCP - General (Family Medicine) Santo Stanly LABOR, MD as PCP - Cardiology (Cardiology) Lionell, Jon DEL, [facility] (Pharmacist) Mannam, Praveen, MD as Consulting Physician (Pulmonary Disease)  I have personally reviewed and noted the following in the patients chart:   Medical and social history Use of alcohol, tobacco or illicit drugs  Current medications and supplements including opioid prescriptions. Functional ability and status Nutritional status Physical activity Advanced directives List of other physicians Hospitalizations, surgeries, and ER visits in previous 12 months Vitals Screenings to include cognitive, depression, and falls Referrals and appointments  No orders of the defined types were placed in this encounter.  In addition, I have reviewed and discussed with patient certain preventive protocols, quality metrics, and best practice recommendations. A written personalized care plan for preventive services as well as general preventive health recommendations were provided to patient.   Rojelio LELON Blush, LPN   [id number]/[id number]   Return in 53 weeks (on 03/16/2025).  After Visit Summary: (In Person-Declined) Patient declined AVS at this time.  Nurse Notes: No voiced or noted concerns at this time "

## 2024-03-11 ENCOUNTER — Ambulatory Visit
Admission: RE | Admit: 2024-03-11 | Discharge: 2024-03-11 | Disposition: A | Discharge status: Home / Self Care | Source: Ambulatory Visit | Attending: Family Medicine | Admitting: Family Medicine

## 2024-03-11 ENCOUNTER — Other Ambulatory Visit: Payer: Self-pay

## 2024-03-11 DIAGNOSIS — Z1231 Encounter for screening mammogram for malignant neoplasm of breast: Secondary | ICD-10-CM

## 2024-03-17 ENCOUNTER — Other Ambulatory Visit: Payer: Self-pay | Admitting: Family Medicine

## 2024-03-17 NOTE — Telephone Encounter (Signed)
 Copied from CRM [id number]. Topic: Clinical - Medication Refill >> Mar 17, 2024 12:23 PM Sasha M wrote: Medication: fexofenadine  (ALLEGRA ) 180 MG tablet Not a current medication but pt has been getting it.  Has the patient contacted their pharmacy? No (Agent: If no, request that the patient contact the pharmacy for the refill. If patient does not wish to contact the pharmacy document the reason why and proceed with request.) (Agent: If yes, when and what did the pharmacy advise?)  This is the patient's preferred pharmacy:  [facility] [place] - [address] [place] [address] Phone: [phone] Fax: [phone]  Is this the correct pharmacy for this prescription? Yes If no, delete pharmacy and type the correct one.   Has the prescription been filled recently? No, last fill 02/06/2024  Is the patient out of the medication? No  Has the patient been seen for an appointment in the last year OR does the patient have an upcoming appointment? Yes  Can we respond through MyChart? No  Agent: Please be advised that Rx refills may take up to 3 business days. We ask that you follow-up with your pharmacy.

## 2024-03-19 ENCOUNTER — Ambulatory Visit: Admitting: Physician Assistant

## 2024-03-28 ENCOUNTER — Inpatient Hospital Stay: Attending: Oncology

## 2024-03-28 MED ORDER — ALLERGY RELIEF 180 MG PO TABS: 180.0000 mg | ORAL_TABLET | Freq: Every day | ORAL | 3 refills | Status: DC

## 2024-04-10 ENCOUNTER — Other Ambulatory Visit: Payer: Self-pay | Admitting: Internal Medicine

## 2024-04-11 ENCOUNTER — Other Ambulatory Visit: Payer: Self-pay | Admitting: Internal Medicine

## 2024-04-16 ENCOUNTER — Inpatient Hospital Stay (HOSPITAL_COMMUNITY)

## 2024-04-16 ENCOUNTER — Other Ambulatory Visit: Payer: Self-pay

## 2024-04-16 ENCOUNTER — Encounter (HOSPITAL_COMMUNITY): Payer: Self-pay

## 2024-04-16 ENCOUNTER — Ambulatory Visit: Admitting: Podiatry

## 2024-04-16 ENCOUNTER — Emergency Department (HOSPITAL_COMMUNITY)

## 2024-04-16 ENCOUNTER — Inpatient Hospital Stay (HOSPITAL_COMMUNITY): Admission: EM | Admit: 2024-04-16 | Source: Home / Self Care | Attending: Neurology | Admitting: Neurology

## 2024-04-16 DIAGNOSIS — I610 Nontraumatic intracerebral hemorrhage in hemisphere, subcortical: Secondary | ICD-10-CM | POA: Diagnosis not present

## 2024-04-16 DIAGNOSIS — R29711 NIHSS score 11: Secondary | ICD-10-CM | POA: Diagnosis not present

## 2024-04-16 DIAGNOSIS — J690 Pneumonitis due to inhalation of food and vomit: Secondary | ICD-10-CM

## 2024-04-16 DIAGNOSIS — I10 Essential (primary) hypertension: Secondary | ICD-10-CM | POA: Diagnosis not present

## 2024-04-16 DIAGNOSIS — J15212 Pneumonia due to Methicillin resistant Staphylococcus aureus: Secondary | ICD-10-CM

## 2024-04-16 DIAGNOSIS — I609 Nontraumatic subarachnoid hemorrhage, unspecified: Secondary | ICD-10-CM

## 2024-04-16 DIAGNOSIS — J45909 Unspecified asthma, uncomplicated: Secondary | ICD-10-CM

## 2024-04-16 DIAGNOSIS — E1165 Type 2 diabetes mellitus with hyperglycemia: Secondary | ICD-10-CM | POA: Diagnosis not present

## 2024-04-16 DIAGNOSIS — Z9911 Dependence on respirator [ventilator] status: Secondary | ICD-10-CM

## 2024-04-16 DIAGNOSIS — I615 Nontraumatic intracerebral hemorrhage, intraventricular: Secondary | ICD-10-CM | POA: Diagnosis not present

## 2024-04-16 DIAGNOSIS — I161 Hypertensive emergency: Secondary | ICD-10-CM | POA: Diagnosis not present

## 2024-04-16 DIAGNOSIS — J9601 Acute respiratory failure with hypoxia: Secondary | ICD-10-CM

## 2024-04-16 DIAGNOSIS — I619 Nontraumatic intracerebral hemorrhage, unspecified: Secondary | ICD-10-CM | POA: Diagnosis present

## 2024-04-16 DIAGNOSIS — G911 Obstructive hydrocephalus: Secondary | ICD-10-CM | POA: Diagnosis not present

## 2024-04-16 LAB — CBC
HCT: 38.1 % (ref 36.0–46.0)
Hemoglobin: 11.9 g/dL — ABNORMAL LOW (ref 12.0–15.0)
MCH: 25.5 pg — ABNORMAL LOW (ref 26.0–34.0)
MCHC: 31.2 g/dL (ref 30.0–36.0)
MCV: 81.6 fL (ref 80.0–100.0)
Platelets: 347 10*3/uL (ref 150–400)
RBC: 4.67 MIL/uL (ref 3.87–5.11)
RDW: 17.7 % — ABNORMAL HIGH (ref 11.5–15.5)
WBC: 9.4 10*3/uL (ref 4.0–10.5)
nRBC: 0 % (ref 0.0–0.2)

## 2024-04-16 LAB — LIPASE, BLOOD: Lipase: 34 U/L (ref 11–51)

## 2024-04-16 LAB — SODIUM: Sodium: 139 mmol/L (ref 135–145)

## 2024-04-16 LAB — I-STAT CHEM 8, ED
BUN: 15 mg/dL (ref 8–23)
Calcium, Ion: 1.07 mmol/L — ABNORMAL LOW (ref 1.15–1.40)
Chloride: 102 mmol/L (ref 98–111)
Creatinine, Ser: 0.9 mg/dL (ref 0.44–1.00)
Glucose, Bld: 204 mg/dL — ABNORMAL HIGH (ref 70–99)
HCT: 39 % (ref 36.0–46.0)
Hemoglobin: 13.3 g/dL (ref 12.0–15.0)
Potassium: 3.3 mmol/L — ABNORMAL LOW (ref 3.5–5.1)
Sodium: 141 mmol/L (ref 135–145)
TCO2: 24 mmol/L (ref 22–32)

## 2024-04-16 LAB — COMPREHENSIVE METABOLIC PANEL WITH GFR
ALT: 17 U/L (ref 0–44)
AST: 22 U/L (ref 15–41)
Albumin: 4.4 g/dL (ref 3.5–5.0)
Alkaline Phosphatase: 114 U/L (ref 38–126)
Anion gap: 16 — ABNORMAL HIGH (ref 5–15)
BUN: 15 mg/dL (ref 8–23)
CO2: 24 mmol/L (ref 22–32)
Calcium: 8.6 mg/dL — ABNORMAL LOW (ref 8.9–10.3)
Chloride: 102 mmol/L (ref 98–111)
Creatinine, Ser: 0.9 mg/dL (ref 0.44–1.00)
GFR, Estimated: >60 mL/min
Glucose, Bld: 186 mg/dL — ABNORMAL HIGH (ref 70–99)
Potassium: 4 mmol/L (ref 3.5–5.1)
Sodium: 142 mmol/L (ref 135–145)
Total Bilirubin: 0.2 mg/dL (ref 0.0–1.2)
Total Protein: 8.1 g/dL (ref 6.5–8.1)

## 2024-04-16 LAB — PROTIME-INR
INR: 3.1 — ABNORMAL HIGH (ref 0.8–1.2)
Prothrombin Time: 33.7 s — ABNORMAL HIGH (ref 11.4–15.2)

## 2024-04-16 LAB — CBG MONITORING, ED: Glucose-Capillary: 187 mg/dL — ABNORMAL HIGH (ref 70–99)

## 2024-04-16 LAB — TROPONIN T, HIGH SENSITIVITY
Troponin T High Sensitivity: 9 ng/L (ref 0–19)
Troponin T High Sensitivity: 11 ng/L (ref 0–19)

## 2024-04-16 LAB — APTT: aPTT: 50 s — ABNORMAL HIGH (ref 24–36)

## 2024-04-16 LAB — ETHANOL: Alcohol, Ethyl (B): <15 mg/dL

## 2024-04-16 MED ORDER — SODIUM CHLORIDE 3 % IV BOLUS
250.0000 mL | Freq: Once | INTRAVENOUS | Status: AC
  Administered 2024-04-16: 250 mL via INTRAVENOUS
  Filled 2024-04-16: qty 500

## 2024-04-16 MED ORDER — PROTHROMBIN COMPLEX CONC HUMAN 500 UNITS IV KIT
2237.0000 [IU] | PACK | Status: AC
  Administered 2024-04-16: 2237 [IU] via INTRAVENOUS
  Filled 2024-04-16: qty 2237

## 2024-04-16 MED ORDER — CLEVIDIPINE BUTYRATE 0.5 MG/ML IV EMUL
32.0000 mg/h | INTRAVENOUS | Status: DC
  Administered 2024-04-16: 2 mg/h via INTRAVENOUS
  Filled 2024-04-16 (×3): qty 50

## 2024-04-16 MED ORDER — ACETAMINOPHEN 650 MG RE SUPP: 650.0000 mg | RECTAL | Status: AC | PRN

## 2024-04-16 MED ORDER — PANTOPRAZOLE SODIUM 40 MG IV SOLR
40.0000 mg | Freq: Every day | INTRAVENOUS | Status: AC
  Administered 2024-04-17 – 2024-04-25 (×9): 40 mg via INTRAVENOUS
  Filled 2024-04-16 (×9): qty 10

## 2024-04-16 MED ORDER — ACETAMINOPHEN 325 MG PO TABS: 650.0000 mg | ORAL_TABLET | ORAL | Status: AC | PRN

## 2024-04-16 MED ORDER — SODIUM CHLORIDE 3 % IV SOLN
INTRAVENOUS | Status: DC
  Filled 2024-04-16 (×4): qty 500

## 2024-04-16 MED ORDER — ACETAMINOPHEN 160 MG/5ML PO SOLN
650.0000 mg | ORAL | Status: AC | PRN
  Administered 2024-04-20 – 2024-04-25 (×15): 650 mg
  Filled 2024-04-16 (×15): qty 20.3

## 2024-04-16 MED ORDER — LABETALOL HCL 5 MG/ML IV SOLN
20.0000 mg | Freq: Once | INTRAVENOUS | Status: AC
  Administered 2024-04-16: 20 mg via INTRAVENOUS

## 2024-04-16 MED ORDER — SENNOSIDES-DOCUSATE SODIUM 8.6-50 MG PO TABS: 1.0000 | ORAL_TABLET | Freq: Two times a day (BID) | ORAL | Status: DC

## 2024-04-16 MED ORDER — IOHEXOL 350 MG/ML SOLN
75.0000 mL | Freq: Once | INTRAVENOUS | Status: AC | PRN
  Administered 2024-04-16: 75 mL via INTRAVENOUS

## 2024-04-16 MED ORDER — STROKE: EARLY STAGES OF RECOVERY BOOK: Freq: Once | Status: AC

## 2024-04-16 MED ORDER — VITAMIN K1 10 MG/ML IJ SOLN
10.0000 mg | Freq: Once | INTRAVENOUS | Status: AC
  Administered 2024-04-16: 10 mg via INTRAVENOUS
  Filled 2024-04-16: qty 1

## 2024-04-16 NOTE — ED Provider Triage Note (Signed)
 Emergency Medicine Provider Triage Evaluation Note  [name] , a 69 y.o. female  was evaluated in triage.  Pt complains of NVD since eating spagetti from freezer today  While in ED she complains of dizziness, room spinning sensation  Review of Systems  Positive: See hpi Negative:   Physical Exam  BP (!) 188/96   Pulse 81   Temp 97.7 F (36.5 C) (Oral)   Resp 20   Ht 5' 6 (1.676 m)   Wt 127 kg   SpO2 98%   BMI 45.19 kg/m  Gen:   Awake, no distress   Resp:  Normal effort  MSK:   Moves extremities without difficulty  Other:  A&Ox3. Motor 5/5 and sensation 2/2 of BUE and BLE. Eyes open to verbal stimuli but are not spontaneously open. Occasionally requires redirection and repeating a question  Medical Decision Making  Medically screening exam initiated at 9:20 PM.  Appropriate orders placed.  [name] was informed that the remainder of the evaluation will be completed by another provider, this initial triage assessment does not replace that evaluation, and the importance of remaining in the ED until their evaluation is complete.  Labs, troponin, CT head ordered   [name], [name] 04/16/24 2123

## 2024-04-16 NOTE — ED Triage Notes (Addendum)
 Arrives [facility] from home with concern of possible food poisoning. Pt drowsy. Says Im not sure why I'm here I just don't feel good.  Had eaten some spaghetti and got some meat out of the freezer. Since then has had increased nausea, vomiting and diarrhea.   GCS-14, intermittently falls asleep during questions

## 2024-04-16 NOTE — Progress Notes (Signed)
 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 She does have some mild ventricular enlargement. Patient is easily arousable. With her being on coumadin , even with reversal, the risk of placing an EVD and causing more hemorrhage is high. INR is pending. Head CT is ordered for 4:30am. Since the patient is arousable we would like to hold off on EVD placement. Discussed with Dr. Onetha and he agrees. Please call with any neurologic changes.

## 2024-04-16 NOTE — ED Provider Notes (Signed)
 "  EMERGENCY DEPARTMENT AT [facility] Provider Note   CSN: [id number] Arrival date & time: 04/16/24  2034     Patient presents with: Code Stroke   [name] is a 69 y.o. female.   This is a 69 year old who presents with altered mental status x 1 day.  Patient has had nausea and vomiting as well as a headache.  Concern initially for possible food poisoning however patient was very lethargic here.  Code stroke initiated and patient taken emergently to CT scanner       Prior to Admission medications  Medication Sig Start Date End Date Taking? Authorizing Provider  acetaminophen  (TYLENOL ) 500 MG tablet Take 500-1,000 mg by mouth every 6 (six) hours as needed for moderate pain (pain score 4-6).    [provider]  albuterol  (ACCUNEB ) 0.63 MG/3ML nebulizer solution Take 3 mLs (0.63 mg total) by nebulization every 6 (six) hours as needed for wheezing. 11/21/23   Mannam, Praveen, MD  albuterol  (VENTOLIN  HFA) 108 (90 Base) MCG/ACT inhaler INHALE TWO (2) PUFFS BY MOUTH EVERY 4 HOURS AS NEEDED FOR SHORTNESS OF BREATH 09/12/23   Mercer Clotilda SAUNDERS, MD  ALLERGY  RELIEF 180 MG tablet Take 1 tablet (180 mg total) by mouth daily. 03/28/24   Mercer Clotilda SAUNDERS, MD  Blood Glucose Monitoring Suppl (ACCU-CHEK GUIDE) w/Device KIT USE TO TEST BLOOD SUGAR 3 TIMES DAILY 07/09/23   Mercer Clotilda SAUNDERS, MD  canagliflozin  (INVOKANA ) 100 MG TABS tablet TAKE 1 TABLET BY MOUTH DAILY BEFORE BREAKFAST *PATIENT MUST KEEP UPCOMING APPOINTMENT 02/2024 WITH CARDIOLOGIST FOR FURTHER REFILLS, FINAL ATTEMPT* 04/15/24   Santo Stanly LABOR, MD  cloNIDine  (CATAPRES ) 0.1 MG tablet TAKE ONE (1) TABLET BY MOUTH TWICE DAILY BEFORE BREAKFAST AND AT BEDTIME 02/13/24   Mercer Clotilda SAUNDERS, MD  diclofenac  sodium (VOLTAREN ) 1 % GEL Apply 2 g topically daily as needed (fibromyalgia pain- affected sites).    [provider]  diltiazem  (CARDIZEM  CD) 240 MG 24 hr capsule TAKE 1 CAPSULE BY MOUTH DAILY BEFORE  BREAKFAST 09/11/23   Mercer Clotilda SAUNDERS, MD  DULoxetine  (CYMBALTA ) 30 MG capsule Take 90 mg by mouth at bedtime.    [provider]  FEROSUL 325 (65 Fe) MG tablet TAKE ONE TABLET BY MOUTH ONCE DAILY Patient not taking: Reported on 02/26/2024 07/19/21   Mercer Clotilda SAUNDERS, MD  fluticasone  (FLONASE ) 50 MCG/ACT nasal spray Place 2 sprays into both nostrils daily as needed for allergies. 08/09/23   [provider]  furosemide  (LASIX ) 80 MG tablet Take 0.5 tablets (40 mg total) by mouth daily. 02/05/24   Chandrasekhar, [name] A, MD  gabapentin  (NEURONTIN ) 300 MG capsule TAKE ONE (1) CAPSULE BY MOUTH TWICE DAILY 06/13/23   Mercer Clotilda SAUNDERS, MD  lidocaine  (LIDODERM ) 5 % APPLY 1 PATCH TOPICALLY ON THE SKIN DAILY AS NEEDED TO PAINFUL SITE *12 HOURS ON AND 12 HOURS OFF OR AS DIRECTED BY MD* 07/12/23   Mercer Clotilda SAUNDERS, MD  LINZESS  145 MCG CAPS capsule Take 1 capsule (145 mcg total) by mouth every other day. 11/09/23   Mercer Clotilda SAUNDERS, MD  metFORMIN  (GLUCOPHAGE ) 500 MG tablet TAKE 1 TABLET BY MOUTH EVERY DAY AT BEDTIME 09/11/23   Mercer Clotilda SAUNDERS, MD  omeprazole  (PRILOSEC) 20 MG capsule TAKE 1 CAPSULE BY MOUTH TWICE DAILY BEFORE A MEAL 11/09/23   Mercer Clotilda SAUNDERS, MD  oxyCODONE  (OXY IR/ROXICODONE ) 5 MG immediate release tablet Take 1 tablet (5 mg total) by mouth every 4 (four) hours as needed for  moderate pain (pain score 4-6). 01/14/24   Gillie Duncans, MD  OZEMPIC , 2 MG/DOSE, 8 MG/3ML SOPN INJECT 2MG  SUBCUTANEOUSLY ONCE WEEKLY 11/09/23   Mercer Clotilda SAUNDERS, MD  potassium chloride  (MICRO-K ) 10 MEQ CR capsule TAKE TWO (2) CAPSULES BY MOUTH EVERY DAY AT BEDTIME 02/13/24   Mercer Clotilda SAUNDERS, MD  QUEtiapine  (SEROQUEL ) 100 MG tablet Take 150 mg by mouth at bedtime. 07/11/23   [provider]  rosuvastatin  (CRESTOR ) 10 MG tablet TAKE 1 TABLET BY MOUTH DAILY 04/15/24   Chandrasekhar, [name] A, MD  spironolactone  (ALDACTONE ) 25 MG tablet Take 0.5 tablets (12.5 mg total) by mouth daily. 01/07/24   Santo Stanly LABOR, MD  SYMBICORT  160-4.5 MCG/ACT inhaler INHALE TWO (2) PUFFS BY MOUTH TWICE DAILY 02/13/24   Mercer Clotilda SAUNDERS, MD  tiZANidine  (ZANAFLEX ) 4 MG tablet Take 1 tablet (4 mg total) by mouth every 6 (six) hours as needed for muscle spasms. 01/14/24   Gillie Duncans, MD  warfarin (COUMADIN ) 10 MG tablet TAKE DAILY AS DIRECTED BY MD 01/10/24   Cloretta Arley NOVAK, MD  warfarin (COUMADIN ) 7.5 MG tablet Take 1 tablet (7.5 mg total) by mouth See admin instructions. Take 7.5mg  (1 tablet) by mouth on Sunday, Tuesday, Thursday, and Saturday. 01/09/24   Cloretta Arley NOVAK, MD  zonisamide  (ZONEGRAN ) 100 MG capsule TAKE 1 CAPSULE BY MOUTH EACH MORNING AND TAKE 2 CAPSULES IN THE EVENING 03/10/24   Mercer Clotilda SAUNDERS, MD    Allergies: Lisinopril , Prednisone , Spironolactone , and Corticosteroids    Review of Systems  Unable to perform ROS: Mental status change    Updated Vital Signs BP (!) 188/96   Pulse 81   Temp 97.7 F (36.5 C) (Oral)   Resp 20   Ht 1.676 m (5' 6)   Wt 127 kg   SpO2 98%   BMI 45.19 kg/m   Physical Exam Vitals and nursing note reviewed.  Constitutional:      General: She is not in acute distress.    Appearance: Normal appearance. She is well-developed. She is not toxic-appearing.  HENT:     Head: Normocephalic and atraumatic.  Eyes:     General: Lids are normal.     Conjunctiva/sclera: Conjunctivae normal.     Pupils: Pupils are equal, round, and reactive to light.  Neck:     Thyroid : No thyroid  mass.     Trachea: No tracheal deviation.  Cardiovascular:     Rate and Rhythm: Normal rate and regular rhythm.     Heart sounds: Normal heart sounds. No murmur heard.    No gallop.  Pulmonary:     Effort: Pulmonary effort is normal. No respiratory distress.     Breath sounds: Normal breath sounds. No stridor. No decreased breath sounds, wheezing, rhonchi or rales.  Abdominal:     General: There is no distension.     Palpations: Abdomen is soft.     Tenderness: There is no  abdominal tenderness. There is no rebound.  Musculoskeletal:        General: No tenderness. Normal range of motion.     Cervical back: Normal range of motion and neck supple.  Skin:    General: Skin is warm and dry.     Findings: No abrasion or rash.  Neurological:     Mental Status: She is lethargic and disoriented.     GCS: GCS eye subscore is 4. GCS verbal subscore is 4. GCS motor subscore is 5.     Cranial Nerves: Cranial nerves 2-12 are  intact. No cranial nerve deficit.     Motor: No tremor.     Comments: Left lower extremity strength was 1 out of 5. Remaining extremity strength normal.  Psychiatric:        Attention and Perception: She is inattentive.     (all labs ordered are listed, but only abnormal results are displayed) Labs Reviewed  CBG MONITORING, ED - Abnormal; Notable for the following components:      Result Value   Glucose-Capillary 187 (*)    All other components within normal limits  LIPASE, BLOOD  COMPREHENSIVE METABOLIC PANEL WITH GFR  CBC  URINALYSIS, ROUTINE W REFLEX MICROSCOPIC  PROTIME-INR  ETHANOL  APTT  URINE DRUG SCREEN  I-STAT CHEM 8, ED  TROPONIN T, HIGH SENSITIVITY    EKG: None  Radiology: CT HEAD CODE STROKE WO CONTRAST (LKW 0-4.5h, LVO 0-24h) Result Date: 04/16/2024 CLINICAL DATA:  Code stroke. Initial evaluation for acute neuro deficit, stroke suspected. EXAM: CT HEAD WITHOUT CONTRAST TECHNIQUE: Contiguous axial images were obtained from the base of the skull through the vertex without intravenous contrast. RADIATION DOSE REDUCTION: This exam was performed according to the departmental dose-optimization program which includes automated exposure control, adjustment of the mA and/or kV according to patient size and/or use of iterative reconstruction technique. COMPARISON:  Prior study from 07/27/2023. FINDINGS: Brain: Small acute intraparenchymal hemorrhage involving the right posterior periventricular white matter measures approximately 7 mm  (series 2, image 19). No significant surrounding regional mass effect. Intraventricular extension with moderate volume hemorrhage within the right greater than left lateral ventricles, as well as the third and fourth ventricle. Lateral ventricles are dilated, consistent with associated hydrocephalus. No significant midline shift. No other acute intracranial hemorrhage. No other acute large vessel territory infarct. No mass lesion or extra-axial fluid collection. Vascular: No abnormal hyperdense vessel. Calcified atherosclerosis present at the skull base. Skull: Scalp soft tissues demonstrate no acute finding. Calvarium intact. Sinuses/Orbits: Globes orbital soft tissues within normal limits. Paranasal sinuses are largely clear. No significant mastoid effusion. Other: None. ASPECTS [facility] Stroke Program Early CT Score) Acute ICH, does not apply. IMPRESSION: 7 mm acute intraparenchymal hemorrhage involving the right posterior periventricular white matter. Intraventricular extension with moderate volume hemorrhage within the right greater than left lateral ventricles, as well as the third and fourth ventricle. Associated hydrocephalus. These results were communicated to Dr. Vanessa at 9:53 pm on 04/16/2024 by text page via the [facility] messaging system. Electronically Signed   By: Morene Hoard M.D.   On: 04/16/2024 21:53     Procedures   Medications Ordered in the ED  prothrombin  complex conc human (KCENTRA ) IVPB 2,237 Units (has no administration in time range)  labetalol  (NORMODYNE ) injection 20 mg (has no administration in time range)  clevidipine  (CLEVIPREX ) infusion 0.5 mg/mL (has no administration in time range)  phytonadione  (VITAMIN K ) 10 mg in dextrose  5 % 50 mL IVPB (has no administration in time range)                                    Medical Decision Making Amount and/or Complexity of Data Reviewed Labs: ordered.   Patient presenting with altered status and is on Coumadin .   CT of head shows evidence of intracranial hemorrhage.  Kcentra  ordered by neurohospitalist.  They plan to meet the patient to the ICU and have assumed care of the patient as of 10:08 PM     Final  diagnoses:  None    ED Discharge Orders     None          Dasie Faden, MD 04/16/24 2208  "

## 2024-04-16 NOTE — H&P (Addendum)
 NEUROLOGY H&P NOTE   Date of service: April 16, 2024 Patient Name: [name] MRN:  [id number] DOB:  1955/08/14 Chief Complaint: headache, nausea, vomiting  History of Present Illness  [name] is a 69 y.o. female with hx of hypertension, hyperlipidemia, diabetes, migraine, hypercoagulable state (+lupus AC and hx of b/l DVT - on coumadin ), OSA, L spine stenosis s/p decompression who presents with headache, nausea, vomiting and confusion/somnolence.  Limited hx due to confusion/somnolence so I called her daughter and spoke with her. Patient lives by herself and is independent at baseline. She called out for her neghbor for not feeling well. Daughter tells me that she heard that her head was hurting, she was throwing up and had diarrhea. Initial concern was for food poisoning. A code stroke was activated on her and STAT CT head demonstrated 7mm R IC hemorrhage with extension into the ventricles with developing moderate hydrocephalus.  Patient is an unreliable historian. She has been filling her warfarin and per prior notes, has been taking it. Patient tells me that she took it this AM. She tells me that she woke up with a headache. She requires constant stimulation to keep her eyes open. She is oriented to self. Doe snot answer any other orientation questions despite repeated attempts.  Last known well: unclear, presumed last night when she went to bed. Modified rankin score: 0 per my discussion with her daughter ICH Score: 1 tNKASE: Not offered due to ICH Thrombectomy: not offered due to ICH NIHSS components Score: Comment  1a Level of Conscious 0[]  1[x]  2[]  3[]      1b LOC Questions 0[]  1[]  2[x]       1c LOC Commands 0[]  1[x]  2[]       2 Best Gaze 0[x]  1[]  2[]       3 Visual 0[x]  1[]  2[]  3[]      4 Facial Palsy 0[x]  1[]  2[]  3[]      5a Motor Arm - left 0[]  1[x]  2[]  3[]  4[]  UN[]    5b Motor Arm - Right 0[]  1[x]  2[]  3[]  4[]  UN[]    6a Motor Leg - Left 0[]  1[]  2[]  3[x]  4[]   UN[]    6b Motor Leg - Right 0[]  1[x]  2[]  3[]  4[]  UN[]    7 Limb Ataxia 0[x]  1[]  2[]  UN[]      8 Sensory 0[x]  1[]  2[]  UN[]      9 Best Language 0[x]  1[]  2[]  3[]      10 Dysarthria 0[]  1[x]  2[]  UN[]      11 Extinct. and Inattention 0[x]  1[]  2[]       TOTAL: 11      ROS  Unable to perform due to somnolence  Past History   Past Medical History:  Diagnosis Date   Adrenal mass 03/226   Benign   Arthritis    knees/multiple orthopedic conditons; lower back   Asthma    per pt   Clotting disorder    +beta-2 -glycoprotein IgA antibody   COPD (chronic obstructive pulmonary disease) (HCC)    inhalers dependent on environment   Depression    Diabetes mellitus    120s usually fasting -  dx more than 10 yrs ago   Dizziness, nonspecific    DVT (deep venous thrombosis) (HCC)    Recurrent   Fibromyalgia    GERD (gastroesophageal reflux disease)    Heart murmur    History of blood transfusion    History of cocaine abuse (HCC)    Remote history    History of kidney stones    Hyperlipidemia  Hypertension    takes meds daily   Hypothyroidism    Lupus Anticoagulant Positive    On home oxygen  therapy    at night   Seizure (HCC)    no seizure in 1.5-2 years per patient (12/17/23)   Shortness of breath    exertion or lying flat   Sleep apnea    2l of oxygen  at night (as of 12/6, she used to)- wears CPAP   Past Surgical History:  Procedure Laterality Date   ABDOMINAL HYSTERECTOMY  1989   Fibroids   ANTERIOR LUMBAR FUSION  01/03/2012   Procedure: ANTERIOR LUMBAR FUSION 1 LEVEL;  Surgeon: Rockey LITTIE Peru, MD;  Location: [facility];  Service: Neurosurgery;  Laterality: N/A;  Lumbar Four-Five Anterior Lumbar Interbody Fusion with Instrumentation   BACK SURGERY     cervical spine---disk disease   CARPAL TUNNEL RELEASE     Bilateral   CESAREAN SECTION     CHOLECYSTECTOMY     CYSTOSCOPY/RETROGRADE/URETEROSCOPY/STONE EXTRACTION WITH BASKET Right 04/26/2014   Procedure: CYSTOSCOPY/ RIGHT  RETROGRADE/ RIGHT URETEROSCOPY/URETERAL AND RENAL PELVIS BIOPSY;  Surgeon: Ricardo Likens, MD;  Location: [facility];  Service: Urology;  Laterality: Right;   ESOPHAGOGASTRODUODENOSCOPY (EGD) WITH PROPOFOL  N/A 08/25/2022   Procedure: ESOPHAGOGASTRODUODENOSCOPY (EGD) WITH PROPOFOL ;  Surgeon: Rollin Dover, MD;  Location: [facility];  Service: Gastroenterology;  Laterality: N/A;   gallstones removed     HEMOSTASIS CLIP PLACEMENT  08/25/2022   Procedure: HEMOSTASIS CLIP PLACEMENT;  Surgeon: Rollin Dover, MD;  Location: [facility];  Service: Gastroenterology;;   HOT HEMOSTASIS N/A 08/25/2022   Procedure: HOT HEMOSTASIS (ARGON PLASMA COAGULATION/BICAP);  Surgeon: Rollin Dover, MD;  Location: [facility];  Service: Gastroenterology;  Laterality: N/A;   POLYPECTOMY  08/25/2022   Procedure: POLYPECTOMY;  Surgeon: Rollin Dover, MD;  Location: [facility];  Service: Gastroenterology;;   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   bilateral   right elbow surgery     RIGHT HEART CATH N/A 02/28/2021   Procedure: RIGHT HEART CATH;  Surgeon: Verlin Lonni BIRCH, MD;  Location: [facility];  Service: Cardiovascular;  Laterality: N/A;   THYROID  LOBECTOMY Right 02/27/2017   THYROID  LOBECTOMY Right 02/27/2017   Procedure: RIGHT THYROID  LOBECTOMY;  Surgeon: Vanderbilt Ned, MD;  Location: [facility];  Service: General;  Laterality: Right;   TUBAL LIGATION     Family History  Problem Relation Age of Onset   Hypertension Mother    Diabetes Mother    Cancer Mother        Pancreatic   Hypertension Father    Heart failure Father    Hyperlipidemia Other    COPD Other    Social History   Socioeconomic History   Marital status: Divorced    Spouse name: Not on file   Number of children: Not on file   Years of education: Not on file   Highest education level: Not on file  Occupational History   Not on file  Tobacco Use   Smoking status: Never   Smokeless tobacco: Never  Vaping Use   Vaping status: Never  Used  Substance and Sexual Activity   Alcohol use: No    Alcohol/week: 0.0 standard drinks of alcohol    Comment: beer in past    Drug use: Not Currently    Types: Cocaine    Comment: quit 2003-rehab progam in 2005   Sexual activity: Never  Other Topics Concern   Not on file  Social History Narrative   Lives alone in apartment,  drives   Social Drivers of Health   Tobacco Use: Low Risk (04/16/2024)   Patient History    Smoking Tobacco Use: Never    Smokeless Tobacco Use: Never    Passive Exposure: Not on file  Financial Resource Strain: Low Risk (03/07/2023)   Overall Financial Resource Strain (CARDIA)    Difficulty of Paying Living Expenses: Not hard at all  Food Insecurity: No Food Insecurity (03/10/2024)   Epic    Worried About Radiation Protection Practitioner of Food in the Last Year: Never true    Ran Out of Food in the Last Year: Never true  Transportation Needs: No Transportation Needs (03/10/2024)   Epic    Lack of Transportation (Medical): No    Lack of Transportation (Non-Medical): No  Physical Activity: Insufficiently Active (03/10/2024)   Exercise Vital Sign    Days of Exercise per Week: 2 days    Minutes of Exercise per Session: 60 min  Stress: Stress Concern Present (03/10/2024)   [employer] of Occupational Health - Occupational Stress Questionnaire    Feeling of Stress: To some extent  Social Connections: Moderately Isolated (03/10/2024)   Social Connection and Isolation Panel    Frequency of Communication with Friends and Family: More than three times a week    Frequency of Social Gatherings with Friends and Family: More than three times a week    Attends Religious Services: More than 4 times per year    Active Member of Clubs or Organizations: No    Attends Banker Meetings: Never    Marital Status: Divorced  Depression (PHQ2-9): Low Risk (03/10/2024)   Depression (PHQ2-9)    PHQ-2 Score: 4  Recent Concern: Depression (PHQ2-9) - High Risk (02/06/2024)    Depression (PHQ2-9)    PHQ-2 Score: 11  Alcohol Screen: Low Risk (03/07/2023)   Alcohol Screen    Last Alcohol Screening Score (AUDIT): 0  Housing: Unknown (03/10/2024)   Epic    Unable to Pay for Housing in the Last Year: No    Number of Times Moved in the Last Year: Not on file    Homeless in the Last Year: No  Utilities: Not At Risk (03/10/2024)   Epic    Threatened with loss of utilities: No  Health Literacy: Adequate Health Literacy (03/10/2024)   B1300 Health Literacy    Frequency of need for help with medical instructions: Never   Allergies[1]  Medications  (Not in a hospital admission)    Vitals   Vitals:   04/16/24 2036 04/16/24 2038 04/16/24 2040  BP:   (!) 188/96  Pulse:   81  Resp:   20  Temp:   97.7 F (36.5 C)  TempSrc:   Oral  SpO2: 96%  98%  Weight:  127 kg   Height:  5' 6 (1.676 m)      Body mass index is 45.19 kg/m.  Physical Exam   General: holding her head; slight distress. HENT: Normal oropharynx and mucosa. Normal external appearance of ears and nose. Neck: Supple, no pain or tenderness  CV: No JVD. No peripheral edema.  Pulmonary: Symmetric Chest rise. Normal respiratory effort.  Abdomen: Soft to touch, non-tender.  Ext: No cyanosis, edema, or deformity  Skin: No rash. Normal palpation of skin.   Musculoskeletal: Normal digits and nails by inspection. No clubbing.   Neurologic Examination  Mental status/Cognition: drowsy, oriented to self but does not answer any other orientation questions. Speech/language: non fluent, with a lot of encouragement, will follow simple  1 step commands but only intermittently. Is able to count fingers held in front of her eyes. Cranial nerves:   CN II Pupils equal and reactive to light, no VF deficits   CN III,IV,VI EOM intact, no gaze preference or deviation, no nystagmus   CN V normal sensation in V1, V2, and V3 segments bilaterally   CN VII Symmetric facial movements.   CN VIII normal hearing to  speech   CN IX & X Protecting her airway so far.   CN XI Head is midline.   CN XII midline tongue protrusion   Motor:  Muscle bulk: normal, tone normal. Holds BL upper extremities off the bed with some slight drift BL. Holds RLE off the bed with some slight drift. Some movement but no antigravity movements in LLE.  Sensation:  Light touch    Pin prick Localizes to proximal pinch in all extremities.   Temperature    Vibration   Proprioception    Coordination/Complex Motor:  - Finger to Nose intact BL - Heel to shin unable to get her to do. This maybe partially due to body habitus but also due to drowsiness. - Rapid alternating movement are slowed BL - Gait: deferred for patient safety as she is high risk for falls.   Labs   CBC: No results for input(s): WBC, NEUTROABS, HGB, HCT, MCV, PLT in the last 168 hours.  Basic Metabolic Panel:  Lab Results  Component Value Date   NA 138 12/17/2023   K 3.7 12/17/2023   CO2 26 12/17/2023   GLUCOSE 129 (H) 12/17/2023   BUN 15 12/17/2023   CREATININE 1.02 (H) 12/17/2023   CALCIUM  8.6 (L) 12/17/2023   GFRNONAA 60 (L) 12/17/2023   GFRAA 94 03/05/2020   Lipid Panel:  Lab Results  Component Value Date   LDLCALC 115 (H) 01/31/2023   HgbA1c:  Lab Results  Component Value Date   HGBA1C 6.5 (H) 12/17/2023   Urine Drug Screen:     Component Value Date/Time   LABOPIA NONE DETECTED 06/12/2014 2054   COCAINSCRNUR NONE DETECTED 06/12/2014 2054   LABBENZ NONE DETECTED 06/12/2014 2054   AMPHETMU NONE DETECTED 06/12/2014 2054   THCU NONE DETECTED 06/12/2014 2054   LABBARB NONE DETECTED 06/12/2014 2054    Alcohol Level     Component Value Date/Time   ETH <5 06/12/2014 1233   INR  Lab Results  Component Value Date   INR 2.3 (H) 02/26/2024   APTT  Lab Results  Component Value Date   APTT 132 (H) 06/12/2014     CT Head without contrast(Personally reviewed): 7 mm acute intraparenchymal hemorrhage involving the  right posterior periventricular white matter. Intraventricular extension with moderate volume hemorrhage within the right greater than left lateral ventricles, as well as the third and fourth ventricle. Associated hydrocephalus.  CT angio Head and Neck with contrast(Personally reviewed): Pending  MRI Brain(Personally reviewed): pending   Impression   Zniya Cottone Iafrate is a 69 y.o. female with hx of hypertension, hyperlipidemia, diabetes, migraine, hypercoagulable state (+lupus AC and hx of b/l DVT - on coumadin ), OSA, L spine stenosis s/p decompression who presents with headache, nausea, vomiting and confusion/somnolence.  She was found to have small volume R periventricular white matter ICH with intraventricular extension with developing hydrocephalus.  Primary Diagnosis:  Nontraumatic intracerebral hemorrhage in hemisphere, subcortical  Secondary Diagnosis: Type 2 diabetes mellitus with hyperglycemia  and Morbid Obesity(BMI > 40)hypertensive emergency. Obstructive hydrocephalus.  Recommendations   Acute Right subcortical Intracerebral hemorrhage with  intraventricular extension: - Admit to ICU - Stability scan in 6 hours or STAT with any neurological decline - Frequent neuro checks; q46min for 1 hour, then q1hour - No antiplatelets or anticoagulants due to ICH - SCD for DVT prophylaxis, pharmacological DVT ppx at 24 hours if ICH is stable - Blood pressure control with [place] systolic 130 - 150, cleverplex and labetalol  PRN - Stroke labs, HgbA1c, fasting lipid panel - MRI brain with and without contrast when stabilized to evaluate for underlying mass - MRA without contrast of the brain and Vasc US  carotid duplex to evaluate for underlying vascular abnormality. - Risk factor modification - Echocardiogram - PT consult, OT consult, Speech consult. - Stroke team to follow - INR elevated to 3.1. Warfarin reversed with Kcentra  and Vit K. Repeat INR is pending. - Hypertonic saline  3%. Will give 250cc bolus, followed by 22ml/hr. - Reached out to [facility] for a STAT consult for evaluation for placement for an EVD. Patient is a established patient with them and was admitted to their service in Oct 2025 for a L spine decompression. I had spoken with Meyran from their team over phone, she will discuss with attending.  DM2: Sliding scale insulin . Will keep her NPO tonight given her drowsiness.  Hypertensive emergency: SBP > 180 on arrival. Given labetalol  20mg  IV and started on Cleviprex .  Morbid obesity: Will need to counsel when she is less drowsy.  ______________________________________________________________________  This patient is critically ill and at significant risk of neurological worsening, death and care requires constant monitoring of vital signs, hemodynamics,respiratory and cardiac monitoring, neurological assessment, discussion with family, other specialists and medical decision making of high complexity. I spent 70 minutes of neurocritical care time  in the care of  this patient. This was time spent independent of any time provided by nurse practitioner or PA.  Plan discussed with Dr. Dasie with the ED team.  I also called daughter twice to update her. Allergies verified with daughter and updated in the chart. Code status verified with daughter and patient is FULL CODE.  Roslind Michaux [facility] 04/16/2024  11:26 PM   Signed,  Shaquel Josephson, MD [facility].     [1]  Allergies Allergen Reactions   Lisinopril  Anaphylaxis, Swelling and Other (See Comments)    THROAT SWELLING, Pt states her throat started to close up after being on it for so long.    Prednisone  Anaphylaxis, Swelling and Other (See Comments)    Throat swelling and tongue irritation   Spironolactone  Other (See Comments)    Caused stroke-like symptoms with the mouth   Corticosteroids Rash    States she developed a rash on her tongue  after a steroid injection in her back

## 2024-04-16 NOTE — ED Notes (Signed)
 Per lab, unable to locate blood sent to lab for analysis.

## 2024-04-16 NOTE — ED Notes (Signed)
 Code Stroke activated per Katrinka FLETCHER RN

## 2024-04-17 ENCOUNTER — Inpatient Hospital Stay (HOSPITAL_COMMUNITY)

## 2024-04-17 ENCOUNTER — Other Ambulatory Visit: Payer: Self-pay

## 2024-04-17 DIAGNOSIS — I161 Hypertensive emergency: Secondary | ICD-10-CM

## 2024-04-17 DIAGNOSIS — Z7984 Long term (current) use of oral hypoglycemic drugs: Secondary | ICD-10-CM

## 2024-04-17 DIAGNOSIS — I610 Nontraumatic intracerebral hemorrhage in hemisphere, subcortical: Secondary | ICD-10-CM | POA: Diagnosis not present

## 2024-04-17 DIAGNOSIS — D6862 Lupus anticoagulant syndrome: Secondary | ICD-10-CM

## 2024-04-17 DIAGNOSIS — E876 Hypokalemia: Secondary | ICD-10-CM | POA: Diagnosis not present

## 2024-04-17 DIAGNOSIS — E785 Hyperlipidemia, unspecified: Secondary | ICD-10-CM | POA: Diagnosis not present

## 2024-04-17 DIAGNOSIS — G936 Cerebral edema: Secondary | ICD-10-CM | POA: Diagnosis not present

## 2024-04-17 DIAGNOSIS — E119 Type 2 diabetes mellitus without complications: Secondary | ICD-10-CM

## 2024-04-17 DIAGNOSIS — Z982 Presence of cerebrospinal fluid drainage device: Secondary | ICD-10-CM | POA: Diagnosis not present

## 2024-04-17 DIAGNOSIS — I69391 Dysphagia following cerebral infarction: Secondary | ICD-10-CM | POA: Diagnosis not present

## 2024-04-17 DIAGNOSIS — I10 Essential (primary) hypertension: Secondary | ICD-10-CM | POA: Diagnosis not present

## 2024-04-17 DIAGNOSIS — G4733 Obstructive sleep apnea (adult) (pediatric): Secondary | ICD-10-CM

## 2024-04-17 DIAGNOSIS — G919 Hydrocephalus, unspecified: Secondary | ICD-10-CM | POA: Diagnosis not present

## 2024-04-17 DIAGNOSIS — I615 Nontraumatic intracerebral hemorrhage, intraventricular: Secondary | ICD-10-CM | POA: Diagnosis not present

## 2024-04-17 DIAGNOSIS — I609 Nontraumatic subarachnoid hemorrhage, unspecified: Secondary | ICD-10-CM

## 2024-04-17 DIAGNOSIS — J9601 Acute respiratory failure with hypoxia: Secondary | ICD-10-CM

## 2024-04-17 LAB — POCT I-STAT EG7
Acid-base deficit: 4 mmol/L — ABNORMAL HIGH (ref 0.0–2.0)
Bicarbonate: 22.4 mmol/L (ref 20.0–28.0)
Calcium, Ion: 1.11 mmol/L — ABNORMAL LOW (ref 1.15–1.40)
HCT: 36 % (ref 36.0–46.0)
Hemoglobin: 12.2 g/dL (ref 12.0–15.0)
O2 Saturation: 84 %
Patient temperature: 98.5
Potassium: 3.6 mmol/L (ref 3.5–5.1)
Sodium: 146 mmol/L — ABNORMAL HIGH (ref 135–145)
TCO2: 24 mmol/L (ref 22–32)
pCO2, Ven: 44.4 mmHg (ref 44–60)
pH, Ven: 7.31 (ref 7.25–7.43)
pO2, Ven: 54 mmHg — ABNORMAL HIGH (ref 32–45)

## 2024-04-17 LAB — GLUCOSE, CAPILLARY
Glucose-Capillary: 114 mg/dL — ABNORMAL HIGH (ref 70–99)
Glucose-Capillary: 120 mg/dL — ABNORMAL HIGH (ref 70–99)
Glucose-Capillary: 121 mg/dL — ABNORMAL HIGH (ref 70–99)
Glucose-Capillary: 126 mg/dL — ABNORMAL HIGH (ref 70–99)
Glucose-Capillary: 138 mg/dL — ABNORMAL HIGH (ref 70–99)
Glucose-Capillary: 173 mg/dL — ABNORMAL HIGH (ref 70–99)

## 2024-04-17 LAB — PROTIME-INR
INR: 1.3 — ABNORMAL HIGH (ref 0.8–1.2)
INR: 1.2 (ref 0.8–1.2)
Prothrombin Time: 16.5 s — ABNORMAL HIGH (ref 11.4–15.2)
Prothrombin Time: 15.8 s — ABNORMAL HIGH (ref 11.4–15.2)

## 2024-04-17 LAB — SODIUM
Sodium: 135 mmol/L (ref 135–145)
Sodium: 145 mmol/L (ref 135–145)
Sodium: 144 mmol/L (ref 135–145)
Sodium: 147 mmol/L — ABNORMAL HIGH (ref 135–145)

## 2024-04-17 LAB — MAGNESIUM: Magnesium: 1.7 mg/dL (ref 1.7–2.4)

## 2024-04-17 LAB — EXPECTORATED SPUTUM ASSESSMENT W GRAM STAIN, RFLX TO RESP C

## 2024-04-17 LAB — MRSA NEXT GEN BY PCR, NASAL: MRSA by PCR Next Gen: NOT DETECTED

## 2024-04-17 MED ORDER — SODIUM CHLORIDE 0.9% FLUSH
10.0000 mL | Freq: Two times a day (BID) | INTRAVENOUS | Status: AC
  Administered 2024-04-17 – 2024-04-18 (×2): 10 mL
  Administered 2024-04-18 – 2024-04-19 (×2): 30 mL
  Administered 2024-04-19: 10 mL
  Administered 2024-04-20: 30 mL
  Administered 2024-04-21: 10 mL
  Administered 2024-04-21: 20 mL
  Administered 2024-04-22: 10 mL
  Administered 2024-04-23: 30 mL
  Administered 2024-04-24: 20 mL
  Administered 2024-04-24: 30 mL
  Administered 2024-04-25: 10 mL
  Administered 2024-04-25: 30 mL

## 2024-04-17 MED ORDER — INSULIN ASPART 100 UNIT/ML IJ SOLN
0.0000 [IU] | INTRAMUSCULAR | Status: DC
  Administered 2024-04-17 (×2): 2 [IU] via SUBCUTANEOUS
  Administered 2024-04-18: 3 [IU] via SUBCUTANEOUS
  Administered 2024-04-18 (×2): 2 [IU] via SUBCUTANEOUS
  Administered 2024-04-18: 3 [IU] via SUBCUTANEOUS
  Administered 2024-04-18: 2 [IU] via SUBCUTANEOUS
  Administered 2024-04-19: 5 [IU] via SUBCUTANEOUS
  Administered 2024-04-19 (×4): 2 [IU] via SUBCUTANEOUS
  Administered 2024-04-20: 3 [IU] via SUBCUTANEOUS
  Administered 2024-04-20: 2 [IU] via SUBCUTANEOUS
  Administered 2024-04-20 – 2024-04-22 (×12): 3 [IU] via SUBCUTANEOUS
  Administered 2024-04-22: 2 [IU] via SUBCUTANEOUS
  Administered 2024-04-22 (×2): 3 [IU] via SUBCUTANEOUS
  Administered 2024-04-22: 5 [IU] via SUBCUTANEOUS
  Administered 2024-04-23 (×3): 3 [IU] via SUBCUTANEOUS
  Filled 2024-04-17: qty 2
  Filled 2024-04-17 (×3): qty 3
  Filled 2024-04-17: qty 2
  Filled 2024-04-17: qty 3
  Filled 2024-04-17 (×2): qty 2
  Filled 2024-04-17: qty 3
  Filled 2024-04-17: qty 2
  Filled 2024-04-17 (×2): qty 5
  Filled 2024-04-17: qty 3
  Filled 2024-04-17: qty 2
  Filled 2024-04-17 (×6): qty 3
  Filled 2024-04-17: qty 2
  Filled 2024-04-17: qty 3
  Filled 2024-04-17: qty 2
  Filled 2024-04-17: qty 3
  Filled 2024-04-17: qty 2
  Filled 2024-04-17: qty 3
  Filled 2024-04-17 (×2): qty 2
  Filled 2024-04-17 (×3): qty 3

## 2024-04-17 MED ORDER — MIDAZOLAM HCL 2 MG/2ML IJ SOLN
INTRAMUSCULAR | Status: AC
  Administered 2024-04-17: 2 mg via INTRAVENOUS
  Filled 2024-04-17: qty 2

## 2024-04-17 MED ORDER — SODIUM CHLORIDE 0.9 % IV SOLN
2.0000 g | INTRAVENOUS | Status: DC
  Administered 2024-04-17 – 2024-04-21 (×5): 2 g via INTRAVENOUS
  Filled 2024-04-17 (×5): qty 20

## 2024-04-17 MED ORDER — LORAZEPAM 2 MG/ML IJ SOLN
INTRAMUSCULAR | Status: AC
  Filled 2024-04-17: qty 1

## 2024-04-17 MED ORDER — SENNA 8.6 MG PO TABS: 1.0000 | ORAL_TABLET | Freq: Two times a day (BID) | ORAL | Status: DC

## 2024-04-17 MED ORDER — LORAZEPAM 2 MG/ML IJ SOLN
INTRAMUSCULAR | Status: AC
  Administered 2024-04-17: 2 mg via INTRAVENOUS
  Filled 2024-04-17: qty 1

## 2024-04-17 MED ORDER — FENTANYL CITRATE (PF) 50 MCG/ML IJ SOSY
PREFILLED_SYRINGE | INTRAMUSCULAR | Status: AC
  Administered 2024-04-17: 50 ug via INTRAVENOUS
  Filled 2024-04-17: qty 1

## 2024-04-17 MED ORDER — ETOMIDATE 2 MG/ML IV SOLN: 20.0000 mg | Freq: Once | INTRAVENOUS | Status: AC

## 2024-04-17 MED ORDER — FENTANYL CITRATE (PF) 50 MCG/ML IJ SOSY
PREFILLED_SYRINGE | INTRAMUSCULAR | Status: AC
  Administered 2024-04-17: 50 ug via INTRAVENOUS
  Filled 2024-04-17: qty 2

## 2024-04-17 MED ORDER — POLYETHYLENE GLYCOL 3350 17 G PO PACK: 17.0000 g | PACK | Freq: Every day | ORAL | Status: DC

## 2024-04-17 MED ORDER — POTASSIUM CHLORIDE 20 MEQ PO PACK
40.0000 meq | PACK | Freq: Once | ORAL | Status: AC
  Administered 2024-04-17: 40 meq

## 2024-04-17 MED ORDER — SENNA 8.6 MG PO TABS
1.0000 | ORAL_TABLET | Freq: Two times a day (BID) | ORAL | Status: DC
  Administered 2024-04-17 – 2024-04-20 (×7): 8.6 mg
  Filled 2024-04-17 (×6): qty 1

## 2024-04-17 MED ORDER — ETOMIDATE 2 MG/ML IV SOLN
INTRAVENOUS | Status: AC
  Administered 2024-04-17: 20 mg via INTRAVENOUS
  Filled 2024-04-17: qty 20

## 2024-04-17 MED ORDER — FENTANYL BOLUS VIA INFUSION: 25.0000 ug | INTRAVENOUS | Status: AC | PRN

## 2024-04-17 MED ORDER — SODIUM CHLORIDE 3 % IV BOLUS
250.0000 mL | Freq: Once | INTRAVENOUS | Status: AC
  Administered 2024-04-17: 250 mL via INTRAVENOUS
  Filled 2024-04-17: qty 500

## 2024-04-17 MED ORDER — ROCURONIUM BROMIDE 10 MG/ML (PF) SYRINGE: 50.0000 mg | PREFILLED_SYRINGE | Freq: Once | INTRAVENOUS | Status: AC

## 2024-04-17 MED ORDER — PROPOFOL 1000 MG/100ML IV EMUL
0.0000 ug/kg/min | INTRAVENOUS | Status: DC
  Administered 2024-04-17: 35 ug/kg/min via INTRAVENOUS
  Administered 2024-04-17: 40 ug/kg/min via INTRAVENOUS
  Administered 2024-04-17: 15 ug/kg/min via INTRAVENOUS
  Administered 2024-04-17: 30 ug/kg/min via INTRAVENOUS
  Administered 2024-04-18: 40 ug/kg/min via INTRAVENOUS
  Administered 2024-04-18: 15 ug/kg/min via INTRAVENOUS
  Administered 2024-04-18: 30 ug/kg/min via INTRAVENOUS
  Filled 2024-04-17 (×9): qty 100

## 2024-04-17 MED ORDER — POLYETHYLENE GLYCOL 3350 17 G PO PACK
17.0000 g | PACK | Freq: Every day | ORAL | Status: DC
  Administered 2024-04-17 – 2024-04-20 (×4): 17 g
  Filled 2024-04-17 (×4): qty 1

## 2024-04-17 MED ORDER — SODIUM CHLORIDE 0.9% FLUSH: 10.0000 mL | INTRAVENOUS | Status: AC | PRN

## 2024-04-17 MED ORDER — FENTANYL CITRATE (PF) 50 MCG/ML IJ SOSY: 50.0000 ug | PREFILLED_SYRINGE | INTRAMUSCULAR | Status: DC | PRN

## 2024-04-17 MED ORDER — FAMOTIDINE 20 MG PO TABS: 20.0000 mg | ORAL_TABLET | Freq: Two times a day (BID) | ORAL | Status: DC

## 2024-04-17 MED ORDER — FENTANYL 2500MCG IN NS 250ML (10MCG/ML) PREMIX INFUSION
0.0000 ug/h | INTRAVENOUS | Status: DC
  Administered 2024-04-17: 50 ug/h via INTRAVENOUS
  Filled 2024-04-17: qty 250

## 2024-04-17 MED ORDER — FENTANYL CITRATE (PF) 50 MCG/ML IJ SOSY: 50.0000 ug | PREFILLED_SYRINGE | Freq: Once | INTRAMUSCULAR | Status: AC

## 2024-04-17 MED ORDER — GADOBUTROL 1 MMOL/ML IV SOLN
10.0000 mL | Freq: Once | INTRAVENOUS | Status: AC | PRN
  Administered 2024-04-17: 10 mL via INTRAVENOUS

## 2024-04-17 MED ORDER — LIDOCAINE HCL (PF) 1 % IJ SOLN
INTRAMUSCULAR | Status: AC
  Administered 2024-04-17: 5 mL via INTRADERMAL
  Filled 2024-04-17: qty 30

## 2024-04-17 MED ORDER — IOHEXOL 350 MG/ML SOLN
75.0000 mL | Freq: Once | INTRAVENOUS | Status: AC | PRN
  Administered 2024-04-17: 75 mL via INTRAVENOUS

## 2024-04-17 MED ORDER — CHLORHEXIDINE GLUCONATE CLOTH 2 % EX PADS
6.0000 | MEDICATED_PAD | Freq: Every day | CUTANEOUS | Status: AC
  Administered 2024-04-17 – 2024-04-25 (×10): 6 via TOPICAL

## 2024-04-17 MED ORDER — FENTANYL CITRATE (PF) 50 MCG/ML IJ SOSY
PREFILLED_SYRINGE | INTRAMUSCULAR | Status: AC
  Filled 2024-04-17: qty 1

## 2024-04-17 MED ORDER — MIDAZOLAM HCL (PF) 2 MG/2ML IJ SOLN: 2.0000 mg | Freq: Once | INTRAMUSCULAR | Status: AC

## 2024-04-17 MED ORDER — LIDOCAINE HCL (PF) 1 % IJ SOLN: 5.0000 mL | Freq: Once | INTRAMUSCULAR | Status: AC

## 2024-04-17 MED ORDER — ROCURONIUM BROMIDE 10 MG/ML (PF) SYRINGE
PREFILLED_SYRINGE | INTRAVENOUS | Status: AC
  Administered 2024-04-17: 50 mg via INTRAVENOUS
  Filled 2024-04-17: qty 10

## 2024-04-17 MED ORDER — LORAZEPAM 2 MG/ML IJ SOLN: 2.0000 mg | Freq: Once | INTRAMUSCULAR | Status: AC

## 2024-04-17 MED ORDER — CLEVIDIPINE BUTYRATE 0.5 MG/ML IV EMUL
0.0000 mg/h | INTRAVENOUS | Status: DC
  Administered 2024-04-17: 16 mg/h via INTRAVENOUS
  Administered 2024-04-17 (×2): 14 mg/h via INTRAVENOUS
  Administered 2024-04-17: 12 mg/h via INTRAVENOUS
  Administered 2024-04-17: 18 mg/h via INTRAVENOUS
  Administered 2024-04-18: 12 mg/h via INTRAVENOUS
  Administered 2024-04-18: 14 mg/h via INTRAVENOUS
  Filled 2024-04-17 (×9): qty 100

## 2024-04-17 NOTE — TOC CAGE-AID Note (Signed)
 Transition of Care [facility]) - CAGE-AID Screening   Patient Details  Name: [name] MRN: [id number] Date of Birth: 1955-07-22  [facility] CM/SW Contact:    Signora Zucco E Amiley Shishido, LCSW Phone Number: 04/17/2024, 9:12 AM   Clinical Narrative: Patient is currently intubated.   CAGE-AID Screening: Substance Abuse Screening unable to be completed due to: : Patient unable to participate

## 2024-04-17 NOTE — Procedures (Addendum)
 Risks and benefits were discussed with the patient and family at bedside. Consent obtained. Initial insertion site was marked midpupilary line just behind the hairline on the right. Patient was prepped and draped in sterile fashion. 2ml of Ativan  and 50mcg of Fentanyl  was given to the patient. Vital signs stable after administration of ativan  and fentanyl  and patient resting comfortably. 3ml of Lidocaine  was used for local anesthetic. Incision with a scalpel was made at the insertion point that was already determined. Hand drill was then used to make a small craniotomy. Dura was felt and then punctured with a needle. Catheter was inserted and advanced until CSF return was seen. Advanced the catheter to 7mm. CSF flow still present. Catheter was then tunneled through a separate insertion site and sutured down securely with nylon suture. Initial incision was suture closed with nylon as well. CSF flow through catheter still patent. Catheter was connected to external drainage system and placed at 10mm of H2O. Sterile dressing applied. Patient tolerated the procedure well and vital signs were stable throughout.    Agree with above.  I extensively talked to the patient's daughter and son about the procedure perioperative course expectations of outcome and alternatives and they understood and agreed to proceed forward.

## 2024-04-17 NOTE — Progress Notes (Signed)
 Peripherally Inserted Central Catheter Placement  The IV Nurse has discussed with the patient and/or persons authorized to consent for the patient, the purpose of this procedure and the potential benefits and risks involved with this procedure.  The benefits include less needle sticks, lab draws from the catheter, and the patient may be discharged home with the catheter. Risks include, but not limited to, infection, bleeding, blood clot (thrombus formation), and puncture of an artery; nerve damage and irregular heartbeat and possibility to perform a PICC exchange if needed/ordered by physician.  Alternatives to this procedure were also discussed.  Bard Power PICC patient education guide, fact sheet on infection prevention and patient information card has been provided to patient /or left at bedside.    PICC Placement Documentation  PICC Triple Lumen 04/17/24 Right Brachial 41 cm 1 cm (Active)  Indication for Insertion or Continuance of Line Administration of hyperosmolar/irritating solutions (i.e. TPN, Vancomycin , etc.) 04/17/24 1545  Exposed Catheter (cm) 1 cm 04/17/24 1545  Site Assessment Clean, Dry, Intact 04/17/24 1545  Lumen #1 Status Saline locked;Flushed;Blood return noted 04/17/24 1545  Lumen #2 Status Saline locked;Flushed;Blood return noted 04/17/24 1545  Lumen #3 Status Saline locked;Flushed;Blood return noted 04/17/24 1545  Dressing Type Transparent;Securing device 04/17/24 1545  Dressing Status Antimicrobial disc/dressing in place;Clean, Dry, Intact 04/17/24 1545  Line Care Connections checked and tightened 04/17/24 1545  Line Adjustment (NICU/IV Team Only) No 04/17/24 1545  Dressing Intervention New dressing 04/17/24 1545  Dressing Change Due 04/24/24 04/17/24 1545   Telephone consent obtained from daughter, [name].    [name] 04/17/2024, 4:00 PM

## 2024-04-17 NOTE — Progress Notes (Signed)
 RT transported patient from 4N25 to CT and back on ventilator without complication. RN at bedside.

## 2024-04-17 NOTE — Progress Notes (Signed)
 Patient transported from 4N25 to CT and back on ventilator without complication. RN at bedside.

## 2024-04-17 NOTE — Progress Notes (Signed)
 "  NAME:  [name], MRN:  [id number], DOB:  May 02, 1955, LOS: 1 ADMISSION DATE:  04/16/2024, CONSULTATION DATE:  04/17/24 REFERRING MD:  neuro, CHIEF COMPLAINT:  ich and declining mental status   History of Present Illness:  69 yo female presented with c/o headache n/v. Pt found to have acute R ICH with ventricular hemorrhage and moderate hydrocephalus.  Prior to presentation pt reportedly called her neighbor stating she was not feeling well and that her head hurt. She was complaining of n/v/d with concern she was having food poisoning. History was obtained from chart and family as while pt was following commands she was requiring relatively freq stimulation to arouse.   While initially, pt was able to arouse and follow commands with some L le weakness however during her time here her mentation continued to decline. Repeat scan revealed worsening bleed with new SAH and 2mm midline shift. Neurosx consulted and placed bedside EVD. No improvement in mentation. Now with sonorous respirations and unable to arouse. Habitus and chronic cpap use precludes pt to obstruct airway and decision was made to electively intubate at this time to protect airway 2/2 declining mental status. Family at bedside and updated on critical state, worsening bleed and mental status and need for intubation. Family confirms full code  Pertinent  Medical History  HTN Hyperlipidemia Dm2 with hyperglycemia Migraines Chronic a/c 2/2 + lupus and h/o bl dvt (coumadin ) OSA on cpap L spine stenosis s/p decompression  Significant Hospital Events: Including procedures, antibiotic start and stop dates in addition to other pertinent events   Admitted to neuro ICU 1/28 Evd placement 1/29 Ccm consulted for intubation 1/29  Interim History / Subjective:  Patient intubated and sedated, family updated at bedside  Objective    Blood pressure (!) 140/76, pulse 90, temperature 99.4 F (37.4 C), temperature source Axillary, resp.  rate (!) 8, height 5' 6 (1.676 m), weight 127 kg, SpO2 100%.    Vent Mode: PRVC FiO2 (%):  [50 %] 50 % Set Rate:  [20 bmp] 20 bmp Vt Set:  [470 mL] 470 mL PEEP:  [5 cmH20] 5 cmH20 Plateau Pressure:  [16 cmH20-23 cmH20] 23 cmH20   Intake/Output Summary (Last 24 hours) at 04/17/2024 0908 Last data filed at 04/17/2024 0700 Gross per 24 hour  Intake 443.47 ml  Output 0 ml  Net 443.47 ml   Filed Weights   04/16/24 2038  Weight: 127 kg   Physical Exam Vitals and nursing note reviewed.  Constitutional:      Appearance: She is obese.     Comments: Intubated and sedated   HENT:     Head:     Comments: EVD  Eyes:     Comments: Sluggish but equal and reactive  Cardiovascular:     Rate and Rhythm: Normal rate and regular rhythm.  Pulmonary:     Breath sounds: Normal breath sounds.     Comments: Intubated  Abdominal:     General: Abdomen is flat. Bowel sounds are normal. There is no distension.     Palpations: Abdomen is soft.  Musculoskeletal:        General: Swelling (non pitting) present.  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 Takes Coumadin  with INR 3.1  on arrival, reversed with Kcentra  and Vit K. Patient became difficult to arouse shortly after admission, with R gaze preference, and difficulty moving left side. Repeat CTH showed worsening hemorrhage with subarachnoid hemorrhage and 2mm mass effect. NSGY placed EVD 1/29. -Neurology following -SBP [place] 130-150 for initial 24 hours, then < 160 -Currently on Cleviprex  infusion; pending med rec resume home antihypertensives  -On 3% Saline 75ml/hr; monitor serum Na q6h. Dexter 150-155.  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  -follows with hematology -positive lupus anticoagulant, beta-2 -glycoprotein IgA antibody   Type II Diabetes  -previously on metformin   Hypertension -outpatient notes show spironolactone , lasix , clonidine , and cardizem  as home medications, awaiting med rec  OSA -uses cpap at home  History of Spinal stenosis s/p lumbar laminectomy 12/2023   Labs   CBC: Recent Labs  Lab 04/16/24 2207 04/16/24 2343 04/17/24 0542  WBC 9.4  --   --   HGB 11.9* 13.3 12.2  HCT 38.1 39.0 36.0  MCV 81.6  --   --   PLT 347  --   --     Basic Metabolic Panel: Recent Labs  Lab 04/16/24 2207 04/16/24 2316 04/16/24 2343 04/17/24 0535 04/17/24 0542  NA 142 139 141 135 146*  K 4.0  --  3.3*  --  3.6  CL 102  --  102  --   --   CO2 24  --   --   --   --   GLUCOSE 186*  --  204*  --   --   BUN 15  --  15  --   --   CREATININE 0.90  --  0.90  --   --   CALCIUM  8.6*  --   --   --   --   MG  --   --   --  1.7  --    GFR: Estimated Creatinine Clearance: 81.6 mL/min (by C-G formula based on SCr of 0.9 mg/dL). Recent Labs  Lab 04/16/24 2207  WBC 9.4    Liver Function Tests: Recent Labs  Lab 04/16/24 2207  AST 22  ALT 17  ALKPHOS 114  BILITOT 0.2  PROT 8.1  ALBUMIN  4.4   Recent Labs  Lab 04/16/24 2207  LIPASE 34   No results for input(s): AMMONIA in the last 168 hours.  ABG    Component Value Date/Time   PHART 7.463 (H) 03/14/2019 1603   PCO2ART 35.8 03/14/2019 1603   PO2ART 82.0 (L) 03/14/2019 1603   HCO3 22.4 04/17/2024 0542   TCO2 24 04/17/2024 0542   ACIDBASEDEF 4.0 (H) 04/17/2024 0542   O2SAT 84 04/17/2024 0542     Coagulation Profile: Recent Labs  Lab 04/16/24 2207 04/17/24 0149 04/17/24 0535  INR 3.1* 1.3* 1.2    Cardiac  Enzymes: No results for input(s): CKTOTAL, CKMB, CKMBINDEX, TROPONINI in the last 168 hours.  HbA1C: Hgb A1c MFr Bld  Date/Time Value Ref Range Status  12/17/2023 11:50 AM 6.5 (H) 4.8 - 5.6 % Final    Comment:    (NOTE) Diagnosis of Diabetes The following HbA1c ranges recommended by the American Diabetes Association (ADA) may be used as an aid in the diagnosis of diabetes mellitus.  Hemoglobin  Suggested A1C NGSP%              Diagnosis  <5.7                   Non Diabetic  5.7-6.4                Pre-Diabetic  >6.4                   Diabetic  <7.0                   Glycemic control for                       adults with diabetes.    07/28/2023 09:54 AM 6.5 (H) 4.8 - 5.6 % Final    Comment:    (NOTE) Pre diabetes:          5.7%-6.4%  Diabetes:              >6.4%  Glycemic control for   <7.0% adults with diabetes     CBG: Recent Labs  Lab 04/16/24 2048 04/17/24 0501 04/17/24 0741  GLUCAP 187* 173* 126*    Review of Systems:   Unobtainable 2/2 neuro status  Past Medical History:  She,  has a past medical history of Adrenal mass (03/226), Arthritis, Asthma, Clotting disorder, COPD (chronic obstructive pulmonary disease) (HCC), Depression, Diabetes mellitus, Dizziness, nonspecific, DVT (deep venous thrombosis) (HCC), Fibromyalgia, GERD (gastroesophageal reflux disease), Heart murmur, History of blood transfusion, History of cocaine abuse (HCC), History of kidney stones, Hyperlipidemia, Hypertension, Hypothyroidism, Lupus Anticoagulant Positive, On home oxygen  therapy, Seizure (HCC), Shortness of breath, and Sleep apnea.   Surgical History:   Past Surgical History:  Procedure Laterality Date   ABDOMINAL HYSTERECTOMY  1989   Fibroids   ANTERIOR LUMBAR FUSION  01/03/2012   Procedure: ANTERIOR LUMBAR FUSION 1 LEVEL;  Surgeon: Rockey LITTIE Peru, MD;  Location: [facility];  Service: Neurosurgery;  Laterality: N/A;  Lumbar Four-Five Anterior Lumbar  Interbody Fusion with Instrumentation   BACK SURGERY     cervical spine---disk disease   CARPAL TUNNEL RELEASE     Bilateral   CESAREAN SECTION     CHOLECYSTECTOMY     CYSTOSCOPY/RETROGRADE/URETEROSCOPY/STONE EXTRACTION WITH BASKET Right 04/26/2014   Procedure: CYSTOSCOPY/ RIGHT RETROGRADE/ RIGHT URETEROSCOPY/URETERAL AND RENAL PELVIS BIOPSY;  Surgeon: Ricardo Likens, MD;  Location: [facility];  Service: Urology;  Laterality: Right;   ESOPHAGOGASTRODUODENOSCOPY (EGD) WITH PROPOFOL  N/A 08/25/2022   Procedure: ESOPHAGOGASTRODUODENOSCOPY (EGD) WITH PROPOFOL ;  Surgeon: Rollin Dover, MD;  Location: [facility];  Service: Gastroenterology;  Laterality: N/A;   gallstones removed     HEMOSTASIS CLIP PLACEMENT  08/25/2022   Procedure: HEMOSTASIS CLIP PLACEMENT;  Surgeon: Rollin Dover, MD;  Location: [facility];  Service: Gastroenterology;;   HOT HEMOSTASIS N/A 08/25/2022   Procedure: HOT HEMOSTASIS (ARGON PLASMA COAGULATION/BICAP);  Surgeon: Rollin Dover, MD;  Location: [facility];  Service: Gastroenterology;  Laterality: N/A;   POLYPECTOMY  08/25/2022   Procedure: POLYPECTOMY;  Surgeon: Rollin Dover, MD;  Location: [facility];  Service: Gastroenterology;;   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   bilateral   right elbow surgery     RIGHT HEART CATH N/A 02/28/2021   Procedure: RIGHT HEART CATH;  Surgeon: Verlin Lonni BIRCH, MD;  Location: [facility] CV [facility];  Service: Cardiovascular;  Laterality: N/A;   THYROID  LOBECTOMY Right 02/27/2017   THYROID  LOBECTOMY Right 02/27/2017   Procedure: RIGHT THYROID  LOBECTOMY;  Surgeon: Vanderbilt Ned, MD;  Location: [facility] OR;  Service: General;  Laterality: Right;   TUBAL LIGATION       Social History:   reports that she has never smoked. She has never used smokeless tobacco. She reports that she does not currently use drugs after having used the following drugs: Cocaine. She reports that she does not drink alcohol.   Family History:  Her family history  includes COPD in an other family member; Cancer in her mother; Diabetes in her mother; Heart failure in her father; Hyperlipidemia in an other family member; Hypertension in her father and mother.   Allergies Allergies[1]   Home Medications  Prior to Admission medications  Medication Sig Start Date End Date Taking? Authorizing Provider  acetaminophen  (TYLENOL ) 500 MG tablet Take 500-1,000 mg by mouth every 6 (six) hours as needed for moderate pain (pain score 4-6).    [provider]  albuterol  (ACCUNEB ) 0.63 MG/3ML nebulizer solution Take 3 mLs (0.63 mg total) by nebulization every 6 (six) hours as needed for wheezing. 11/21/23   Mannam, Praveen, MD  albuterol  (VENTOLIN  HFA) 108 (90 Base) MCG/ACT inhaler INHALE TWO (2) PUFFS BY MOUTH EVERY 4 HOURS AS NEEDED FOR SHORTNESS OF BREATH 09/12/23   Mercer Clotilda SAUNDERS, MD  ALLERGY  RELIEF 180 MG tablet Take 1 tablet (180 mg total) by mouth daily. 03/28/24   Mercer Clotilda SAUNDERS, MD  Blood Glucose Monitoring Suppl (ACCU-CHEK GUIDE) w/Device KIT USE TO TEST BLOOD SUGAR 3 TIMES DAILY 07/09/23   Mercer Clotilda SAUNDERS, MD  canagliflozin  (INVOKANA ) 100 MG TABS tablet TAKE 1 TABLET BY MOUTH DAILY BEFORE BREAKFAST *PATIENT MUST KEEP UPCOMING APPOINTMENT 02/2024 WITH CARDIOLOGIST FOR FURTHER REFILLS, FINAL ATTEMPT* 04/15/24   Chandrasekhar, Stanly A, MD  cloNIDine  (CATAPRES ) 0.1 MG tablet TAKE ONE (1) TABLET BY MOUTH TWICE DAILY BEFORE BREAKFAST AND AT BEDTIME 02/13/24   Mercer Clotilda SAUNDERS, MD  diclofenac  sodium (VOLTAREN ) 1 % GEL Apply 2 g topically daily as needed (fibromyalgia pain- affected sites).    [provider]  diltiazem  (CARDIZEM  CD) 240 MG 24 hr capsule TAKE 1 CAPSULE BY MOUTH DAILY BEFORE BREAKFAST 09/11/23   Mercer Clotilda SAUNDERS, MD  DULoxetine  (CYMBALTA ) 30 MG capsule Take 90 mg by mouth at bedtime.    [provider]  FEROSUL 325 (65 Fe) MG tablet TAKE ONE TABLET BY MOUTH ONCE DAILY Patient not taking: Reported on 02/26/2024 07/19/21   Mercer Clotilda SAUNDERS, MD  fluticasone  (FLONASE ) 50 MCG/ACT nasal spray Place 2 sprays into both nostrils daily as needed for allergies. 08/09/23   [provider]  furosemide  (LASIX ) 80 MG tablet Take 0.5 tablets (40 mg total) by mouth daily. 02/05/24   Chandrasekhar, Stanly A, MD  gabapentin  (NEURONTIN ) 300 MG capsule TAKE ONE (1) CAPSULE BY MOUTH TWICE DAILY 06/13/23   Mercer Clotilda SAUNDERS, MD  lidocaine  (LIDODERM ) 5 % APPLY 1 PATCH TOPICALLY ON THE SKIN DAILY AS NEEDED TO PAINFUL SITE *12 HOURS ON AND 12 HOURS OFF OR AS DIRECTED BY MD* 07/12/23   Mercer Clotilda SAUNDERS, MD  LINZESS  145 MCG CAPS capsule Take 1 capsule (145 mcg total) by mouth every other day. 11/09/23   Mercer Clotilda SAUNDERS, MD  metFORMIN  (GLUCOPHAGE ) 500 MG tablet TAKE 1 TABLET BY MOUTH EVERY DAY AT BEDTIME 09/11/23   Mercer Clotilda SAUNDERS, MD  omeprazole  (PRILOSEC) 20 MG capsule TAKE 1 CAPSULE BY MOUTH TWICE DAILY BEFORE A MEAL 11/09/23   Mercer Clotilda SAUNDERS, MD  oxyCODONE  (OXY IR/ROXICODONE ) 5 MG immediate release  tablet Take 1 tablet (5 mg total) by mouth every 4 (four) hours as needed for moderate pain (pain score 4-6). 01/14/24   Gillie Duncans, MD  OZEMPIC , 2 MG/DOSE, 8 MG/3ML SOPN INJECT 2MG  SUBCUTANEOUSLY ONCE WEEKLY 11/09/23   Mercer Clotilda SAUNDERS, MD  potassium chloride  (MICRO-K ) 10 MEQ CR capsule TAKE TWO (2) CAPSULES BY MOUTH EVERY DAY AT BEDTIME 02/13/24   Mercer Clotilda SAUNDERS, MD  QUEtiapine  (SEROQUEL ) 100 MG tablet Take 150 mg by mouth at bedtime. 07/11/23   [provider]  rosuvastatin  (CRESTOR ) 10 MG tablet TAKE 1 TABLET BY MOUTH DAILY 04/15/24   Chandrasekhar, [name] A, MD  spironolactone  (ALDACTONE ) 25 MG tablet Take 0.5 tablets (12.5 mg total) by mouth daily. 01/07/24   Santo Stanly LABOR, MD  SYMBICORT  160-4.5 MCG/ACT inhaler INHALE TWO (2) PUFFS BY MOUTH TWICE DAILY 02/13/24   Mercer Clotilda SAUNDERS, MD  tiZANidine  (ZANAFLEX ) 4 MG tablet Take 1 tablet (4 mg total) by mouth every 6 (six) hours as needed for muscle spasms. 01/14/24   Gillie Duncans, MD  warfarin (COUMADIN ) 10 MG tablet TAKE DAILY AS DIRECTED BY MD 01/10/24   Cloretta Arley NOVAK, MD  warfarin (COUMADIN ) 7.5 MG tablet Take 1 tablet (7.5 mg total) by mouth See admin instructions. Take 7.5mg  (1 tablet) by mouth on Sunday, Tuesday, Thursday, and Saturday. 01/09/24   Cloretta Arley NOVAK, MD  zonisamide  (ZONEGRAN ) 100 MG capsule TAKE 1 CAPSULE BY MOUTH EACH MORNING AND TAKE 2 CAPSULES IN THE EVENING 03/10/24   Mercer Clotilda SAUNDERS, MD     Critical care time:    Keven Fila, PA-C [facility] For pager details, please see AMION or use Epic chat  After 1900, please call [facility] for cross coverage needs 04/17/2024, 10:01 AM           [1]  Allergies Allergen Reactions   Lisinopril  Anaphylaxis, Swelling and Other (See Comments)    THROAT SWELLING, Pt states her throat started to close up after being on it for so long.    Prednisone  Anaphylaxis, Swelling and Other (See Comments)    Throat swelling and tongue irritation   Spironolactone  Other (See Comments)    Caused stroke-like symptoms with the mouth   Corticosteroids Rash    States she developed a rash on her tongue after a steroid injection in her back   "

## 2024-04-17 NOTE — Consult Note (Addendum)
 Reason for Consult:IVH Referring Physician: neurology  [name] is an 69 y.o. female.   HPI:  69 year old female presented to the ED last night with AMS. She takes coumadin . She was at home and had some confusion, HAs, NV. She called her neighbor to tell her she wasn't feeling well.   Past Medical History:  Diagnosis Date   Adrenal mass 03/226   Benign   Arthritis    knees/multiple orthopedic conditons; lower back   Asthma    per pt   Clotting disorder    +beta-2 -glycoprotein IgA antibody   COPD (chronic obstructive pulmonary disease) (HCC)    inhalers dependent on environment   Depression    Diabetes mellitus    120s usually fasting -  dx more than 10 yrs ago   Dizziness, nonspecific    DVT (deep venous thrombosis) (HCC)    Recurrent   Fibromyalgia    GERD (gastroesophageal reflux disease)    Heart murmur    History of blood transfusion    History of cocaine abuse (HCC)    Remote history    History of kidney stones    Hyperlipidemia    Hypertension    takes meds daily   Hypothyroidism    Lupus Anticoagulant Positive    On home oxygen  therapy    at night   Seizure (HCC)    no seizure in 1.5-2 years per patient (12/17/23)   Shortness of breath    exertion or lying flat   Sleep apnea    2l of oxygen  at night (as of 12/6, she used to)- wears CPAP    Past Surgical History:  Procedure Laterality Date   ABDOMINAL HYSTERECTOMY  1989   Fibroids   ANTERIOR LUMBAR FUSION  01/03/2012   Procedure: ANTERIOR LUMBAR FUSION 1 LEVEL;  Surgeon: Rockey LITTIE Peru, MD;  Location: [facility];  Service: Neurosurgery;  Laterality: N/A;  Lumbar Four-Five Anterior Lumbar Interbody Fusion with Instrumentation   BACK SURGERY     cervical spine---disk disease   CARPAL TUNNEL RELEASE     Bilateral   CESAREAN SECTION     CHOLECYSTECTOMY     CYSTOSCOPY/RETROGRADE/URETEROSCOPY/STONE EXTRACTION WITH BASKET Right 04/26/2014   Procedure: CYSTOSCOPY/ RIGHT RETROGRADE/ RIGHT  URETEROSCOPY/URETERAL AND RENAL PELVIS BIOPSY;  Surgeon: Ricardo Likens, MD;  Location: [facility];  Service: Urology;  Laterality: Right;   ESOPHAGOGASTRODUODENOSCOPY (EGD) WITH PROPOFOL  N/A 08/25/2022   Procedure: ESOPHAGOGASTRODUODENOSCOPY (EGD) WITH PROPOFOL ;  Surgeon: Rollin Dover, MD;  Location: [facility];  Service: Gastroenterology;  Laterality: N/A;   gallstones removed     HEMOSTASIS CLIP PLACEMENT  08/25/2022   Procedure: HEMOSTASIS CLIP PLACEMENT;  Surgeon: Rollin Dover, MD;  Location: [facility];  Service: Gastroenterology;;   HOT HEMOSTASIS N/A 08/25/2022   Procedure: HOT HEMOSTASIS (ARGON PLASMA COAGULATION/BICAP);  Surgeon: Rollin Dover, MD;  Location: [facility];  Service: Gastroenterology;  Laterality: N/A;   POLYPECTOMY  08/25/2022   Procedure: POLYPECTOMY;  Surgeon: Rollin Dover, MD;  Location: [facility];  Service: Gastroenterology;;   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   bilateral   right elbow surgery     RIGHT HEART CATH N/A 02/28/2021   Procedure: RIGHT HEART CATH;  Surgeon: Verlin Lonni BIRCH, MD;  Location: [facility];  Service: Cardiovascular;  Laterality: N/A;   THYROID  LOBECTOMY Right 02/27/2017   THYROID  LOBECTOMY Right 02/27/2017   Procedure: RIGHT THYROID  LOBECTOMY;  Surgeon: Vanderbilt Ned, MD;  Location: [facility];  Service: General;  Laterality: Right;   TUBAL  LIGATION      Allergies[1]  Social History   Tobacco Use   Smoking status: Never   Smokeless tobacco: Never  Substance Use Topics   Alcohol use: No    Alcohol/week: 0.0 standard drinks of alcohol    Comment: beer in past     Family History  Problem Relation Age of Onset   Hypertension Mother    Diabetes Mother    Cancer Mother        Pancreatic   Hypertension Father    Heart failure Father    Hyperlipidemia Other    COPD Other      Review of Systems  Positive ROS: patient somnolent   All other systems have been reviewed and were otherwise negative with the exception of  those mentioned in the HPI and as above.  Objective: Vital signs in last 24 hours: Temp:  [97.7 F (36.5 C)-97.8 F (36.6 C)] 97.8 F (36.6 C) (01/29 0025) Pulse Rate:  [62-109] 90 (01/29 0100) Resp:  [13-27] 20 (01/29 0100) BP: (111-191)/(66-166) 135/73 (01/29 0100) SpO2:  [91 %-100 %] 96 % (01/29 0100) Weight:  [127 kg] 127 kg (01/28 2038)  General Appearance: somnolent Head: Normocephalic, without obvious abnormality, atraumatic Eyes: PERRL, sluggish pupils bilaterally  Lungs: respirations unlabored Heart: Regular rate and rhythm,  Extremities: Extremities normal, atraumatic, no cyanosis or edema Pulses: 2+ and symmetric all extremities Skin: Skin color, texture, turgor normal, no rashes or lesions  NEUROLOGIC:   Mental status: somnolent Motor Exam - grossly normal, normal tone and bulk Sensory Exam - grossly normal Reflexes: symmetric, no pathologic reflexes, No Hoffman's, No clonus Coordination - grossly normal Gait -not tested Balance - not tested Cranial Nerves: I: smell Not tested  II: visual acuity  OS: na    OD: na  II: visual fields   II: pupils Equal, round, reactive to light  III,VII: ptosis None  III,IV,VI: extraocular muscles  R gaze preference  V: mastication   V: facial light touch sensation    V,VII: corneal reflex  Present  VII: facial muscle function - upper    VII: facial muscle function - lower   VIII: hearing   IX: soft palate elevation    IX,X: gag reflex   XI: trapezius strength    XI: sternocleidomastoid strength   XI: neck flexion strength    XII: tongue strength      Data Review Lab Results  Component Value Date   WBC 9.4 04/16/2024   HGB 13.3 04/16/2024   HCT 39.0 04/16/2024   MCV 81.6 04/16/2024   PLT 347 04/16/2024   Lab Results  Component Value Date   NA 141 04/16/2024   K 3.3 (L) 04/16/2024   CL 102 04/16/2024   CO2 24 04/16/2024   BUN 15 04/16/2024   CREATININE 0.90 04/16/2024   GLUCOSE 204 (H) 04/16/2024   Lab  Results  Component Value Date   INR 3.1 (H) 04/16/2024   PROTIME 16.8 (H) 03/08/2017    Radiology: CT HEAD POST STROKE FOLLOWUP/TIMED/STAT READ Result Date: 04/17/2024 CLINICAL DATA:  Follow-up examination for acute intracranial hemorrhage. EXAM: CT HEAD WITHOUT CONTRAST TECHNIQUE: Contiguous axial images were obtained from the base of the skull through the vertex without intravenous contrast. RADIATION DOSE REDUCTION: This exam was performed according to the departmental dose-optimization program which includes automated exposure control, adjustment of the mA and/or kV according to patient size and/or use of iterative reconstruction technique. COMPARISON:  Prior CTs from earlier the same day. FINDINGS: Brain: There  has been interval worsening in previously identified intraventricular hemorrhage, with large volume hemorrhage now seen throughout the right lateral ventricle. Probable worsening dilatation of the underlying right lateral ventricle with ventricular trapping, worsened from prior. New subarachnoid extension with hemorrhage about the basilar cisterns, with additional trace subarachnoid blood within the left cerebral hemisphere. The underlying inciting right periventricular hemorrhage itself is otherwise grossly similar. Mass effect with mild 2 mm right-to-left shift now seen. No other new intracranial hemorrhage. No other acute large vessel territory infarct. No extra-axial fluid collection. Vascular: Contrast material seen throughout the intracranial circulation related to prior CTA. Calcified atherosclerosis about the skull base. Skull: Scalp soft tissues and calvarium demonstrate no new finding. Sinuses/Orbits: Globes and orbital soft tissues within normal limits. Visualized paranasal sinuses are clear. No significant mastoid effusion. Other: None. IMPRESSION: 1. Interval worsening in previously identified intraventricular hemorrhage, with large volume hemorrhage now seen throughout the right  lateral ventricle. Probable worsening dilatation of the underlying right lateral ventricle with ventricular trapping. New subarachnoid extension with hemorrhage about the basilar cisterns, with additional trace subarachnoid blood within the left cerebral hemisphere. 2. Progressive mass effect with early/mild 2 mm right-to-left midline shift. Results discussed by telephone at the time of interpretation on 04/17/2024 at approximately 1 a.m. to provider [facility]. Electronically Signed   By: Morene Hoard M.D.   On: 04/17/2024 01:14   CT HEAD CODE STROKE WO CONTRAST (LKW 0-4.5h, LVO 0-24h) Result Date: 04/16/2024 CLINICAL DATA:  Code stroke. Initial evaluation for acute neuro deficit, stroke suspected. EXAM: CT HEAD WITHOUT CONTRAST TECHNIQUE: Contiguous axial images were obtained from the base of the skull through the vertex without intravenous contrast. RADIATION DOSE REDUCTION: This exam was performed according to the departmental dose-optimization program which includes automated exposure control, adjustment of the mA and/or kV according to patient size and/or use of iterative reconstruction technique. COMPARISON:  Prior study from 07/27/2023. FINDINGS: Brain: Small acute intraparenchymal hemorrhage involving the right posterior periventricular white matter measures approximately 7 mm (series 2, image 19). No significant surrounding regional mass effect. Intraventricular extension with moderate volume hemorrhage within the right greater than left lateral ventricles, as well as the third and fourth ventricle. Lateral ventricles are dilated, consistent with associated hydrocephalus. No significant midline shift. No other acute intracranial hemorrhage. No other acute large vessel territory infarct. No mass lesion or extra-axial fluid collection. Vascular: No abnormal hyperdense vessel. Calcified atherosclerosis present at the skull base. Skull: Scalp soft tissues demonstrate no acute finding.  Calvarium intact. Sinuses/Orbits: Globes orbital soft tissues within normal limits. Paranasal sinuses are largely clear. No significant mastoid effusion. Other: None. ASPECTS [facility] Stroke Program Early CT Score) Acute ICH, does not apply. IMPRESSION: 7 mm acute intraparenchymal hemorrhage involving the right posterior periventricular white matter. Intraventricular extension with moderate volume hemorrhage within the right greater than left lateral ventricles, as well as the third and fourth ventricle. Associated hydrocephalus. These results were communicated to Dr. Vanessa at 9:53 pm on 04/16/2024 by text page via the [facility] messaging system. Electronically Signed   By: Morene Hoard M.D.   On: 04/16/2024 21:53     Assessment/Plan: 69 year old female presented with AMS. CT head shows a lefft ICH with moderate hydrocephalus and IVH. Follow up scan was worsened with new SAH along the basilar cisterns and 2mm midline shift. Patient has worsened neurologically compared to an hour ago so we will move forward with EVD placement. Spoke with daughter and obtained consent.    [name] 04/17/2024  1:23 AM  Agree with above scan shows significant extension right posterior parietal temporal lobe.  Due to extension of hemorrhage and declining mental status we did recommend future extraventricular drain placement.  Extensive went over the risks and benefits of the procedure with the patient's daughter as well as perioperative course expectations of outcome and alternatives to surgery she understood and agreed to proceed forward.  Do think the patient's had enough time for the Kcentra  to reverse her coagulopathy        [1]  Allergies Allergen Reactions   Lisinopril  Anaphylaxis, Swelling and Other (See Comments)    THROAT SWELLING, Pt states her throat started to close up after being on it for so long.    Prednisone  Anaphylaxis, Swelling and Other (See Comments)    Throat swelling  and tongue irritation   Spironolactone  Other (See Comments)    Caused stroke-like symptoms with the mouth   Corticosteroids Rash    States she developed a rash on her tongue after a steroid injection in her back

## 2024-04-17 NOTE — Progress Notes (Signed)
 eLink Physician-Brief Progress Note Patient Name: [name] Threats DOB: 10/11/1955 MRN: [id number]   Date of Service  04/17/2024  HPI/Events of Note  24 F obse (BMI 45), OSA on CPAP, recurrent DVT, +lupus AC on warfarin (INR 3.1), HTN, HLD, DM (A1C 6.5) initially presented with HA/N/V found to have right ICH with IVH and moderate hydrocephalous. Kcentra  and  Vit K given. Repeat scan done due to decreased mentation with note of new SAH and 2 mm midline shift. Neurosurgery went ahead and placed EVD at bedside. She continues to be somnolent and was eventually intubated.  eICU Interventions  Post intubation CXR and ABG pending On Cleviprex      Intervention Category Evaluation Type: New Patient Evaluation  Damien ONEIDA Grout 04/17/2024, 5:06 AM

## 2024-04-17 NOTE — Procedures (Signed)
 Intubation Procedure Note  [name]  [id number]  October 23, 1955  Date:04/17/24  Time:5:19 AM   Provider Performing:Tracye Szuch Layman    Procedure: Intubation (31500)  Indication(s) Respiratory Failure  Consent Risks of the procedure as well as the alternatives and risks of each were explained to the patient and/or caregiver.  Consent for the procedure was obtained and is signed in the bedside chart   Anesthesia Etomidate , Versed , Fentanyl , and Rocuronium    Time Out Verified patient identification, verified procedure, site/side was marked, verified correct patient position, special equipment/implants available, medications/allergies/relevant history reviewed, required imaging and test results available.   Sterile Technique Usual hand hygeine, masks, and gloves were used   Procedure Description Patient positioned in bed supine.  Sedation given as noted above.  Patient was intubated with endotracheal tube using Glidescope.  View was Grade 1 full glottis .  Number of attempts was 1.  Colorimetric CO2 detector was consistent with tracheal placement.   Complications/Tolerance None; patient tolerated the procedure well. Chest X-ray is ordered to verify placement.   EBL Minimal   Specimen(s) None

## 2024-04-17 NOTE — Progress Notes (Signed)
 eLink Physician-Brief Progress Note Patient Name: [name] DOB: 08-07-1955 MRN: [id number]   Date of Service  04/17/2024  HPI/Events of Note  Patient in ICU intubated after presenting with right ICH.  Has a pure wick in place but not voiding.  Bladder scan performed reviewed about 450 mL of urine.  RN asking for Foley catheter.  eICU Interventions  Order placed.     Intervention Category Minor Interventions: Routine modifications to care plan (e.g. PRN medications for pain, fever)  Jerilynn Berg 04/17/2024, 10:34 PM

## 2024-04-17 NOTE — Progress Notes (Signed)
 STROKE TEAM PROGRESS NOTE   INTERVAL HISTORY Her family is at the bedside. Lived alone with NA assistance PTA. Memory deficits at baseline. This am remains intubated, non-responsive. CT at midnight with increased blood and decreased arousal, EVD placed (CNSA) ~0130a. With no improvement in mentation and increased respiratory difficulty, CCM was consulted and pt intubated. EVD remains functional. This am repeat CT w/ decreased hydrocephalus.   Vitals:   04/17/24 0700 04/17/24 0715 04/17/24 0734 04/17/24 0800  BP: (!) 148/73 (!) 140/76    Pulse: 93 90    Resp: 19 (!) 8    Temp:    99.4 F (37.4 C)  TempSrc:    Axillary  SpO2: 97% 100% 100%   Weight:      Height:       CBC:  Recent Labs  Lab 04/16/24 2207 04/16/24 2343 04/17/24 0542  WBC 9.4  --   --   HGB 11.9* 13.3 12.2  HCT 38.1 39.0 36.0  MCV 81.6  --   --   PLT 347  --   --    Basic Metabolic Panel:  Recent Labs  Lab 04/16/24 2207 04/16/24 2316 04/16/24 2343 04/17/24 0535 04/17/24 0542  NA 142   < > 141 135 146*  K 4.0  --  3.3*  --  3.6  CL 102  --  102  --   --   CO2 24  --   --   --   --   GLUCOSE 186*  --  204*  --   --   BUN 15  --  15  --   --   CREATININE 0.90  --  0.90  --   --   CALCIUM  8.6*  --   --   --   --   MG  --   --   --  1.7  --    < > = values in this interval not displayed.   Lipid Panel: No results for input(s): CHOL, TRIG, HDL, CHOLHDL, VLDL, LDLCALC in the last 168 hours. Urine Drug Screen: No results for input(s): LABOPIA, COCAINSCRNUR, LABBENZ, AMPHETMU, THCU, LABBARB in the last 168 hours.  Alcohol Level  Recent Labs  Lab 04/16/24 2207  ETH <15    IMAGING past 24 hours DG CHEST PORT 1 VIEW Result Date: 04/17/2024 CLINICAL DATA:  Endotracheal tube evaluation. EXAM: PORTABLE CHEST 1 VIEW COMPARISON:  Earlier in the day at 5:16 a.m. FINDINGS: 7:50 a.m.  Endotracheal tube terminates 4.2 cm above carina. Nasogastric tube extends beyond the  inferior aspect of  the film. Numerous leads and wires project over the chest. Mildly degraded exam due to AP portable technique and patient body habitus. Moderate cardiomegaly. No pleural effusion or pneumothorax. Suspect new right lateral lower lobe airspace disease, presumably atelectasis. Minimal subsegmental atelectasis at the left lung base. Low lung volumes with resultant pulmonary interstitial prominence. IMPRESSION: Appropriate position of endotracheal tube after readjustment. Cardiomegaly and low lung volumes with bibasilar atelectasis. Electronically Signed   By: Rockey Kilts M.D.   On: 04/17/2024 08:17   DG Chest Port 1 View Result Date: 04/17/2024 EXAM: 1 VIEW(S) XRAY OF THE CHEST 04/17/2024 05:25:01 AM COMPARISON: 07/27/2023 CLINICAL HISTORY: Endotracheally intubated. FINDINGS: LINES, TUBES AND DEVICES: Endotracheal tube within lobe position directed towards the right mainstem bronchus. Consider withdrawal at least 2 cm. LUNGS AND PLEURA: Low lung volumes. Asymmetric hazy opacification of the left lung, which may reflect posterior layering pleural effusion versus technical artifact related to patient positioning.  Patchy densities in the left base likely reflect atelectasis. No pneumothorax. HEART AND MEDIASTINUM: No acute abnormality of the cardiac and mediastinal silhouettes. BONES AND SOFT TISSUES: Cervical spine fusion hardware noted. No acute osseous abnormality. IMPRESSION: 1. Endotracheal tube is low-lying and directed toward the right mainstem bronchus; recommend withdrawal at least 2 cm and repeat chest radiograph to confirm position. 2. Asymmetric hazy opacification of the left lung, favored to represent dependent atelectasis with possible small posterior layering pleural effusion, with positioning artifact less likely; consider repeat upright/decubitus radiograph or bedside ultrasound as needed to further assess for pleural fluid. 3. Patchy densities at the left base likely reflect atelectasis; follow-up  chest radiograph can be obtained to document improvement or if respiratory status worsens. 4. The urgent finding will be called to the ordering provider by the Professional Radiology Assistants (PRAs) and documented in the [facility] dashboard. Electronically signed by: Waddell Calk MD 04/17/2024 06:14 AM EST RP Workstation: [id number]   CT ANGIO HEAD NECK W WO CM Result Date: 04/17/2024 CLINICAL DATA:  Initial evaluation for acute neuro deficit, stroke suspected. EXAM: CT ANGIOGRAPHY HEAD AND NECK WITH AND WITHOUT CONTRAST TECHNIQUE: Multidetector CT imaging of the head and neck was performed using the standard protocol during bolus administration of intravenous contrast. Multiplanar CT image reconstructions and MIPs were obtained to evaluate the vascular anatomy. Carotid stenosis measurements (when applicable) are obtained utilizing NASCET criteria, using the distal internal carotid diameter as the denominator. RADIATION DOSE REDUCTION: This exam was performed according to the departmental dose-optimization program which includes automated exposure control, adjustment of the mA and/or kV according to patient size and/or use of iterative reconstruction technique. CONTRAST:  75mL OMNIPAQUE  IOHEXOL  350 MG/ML SOLN COMPARISON:  Comparison made with CT from 04/08/2024. FINDINGS: CTA NECK FINDINGS Aortic arch: Visualized aortic arch within normal limits for caliber. Bovine branching pattern noted. No significant stenosis about the origin the great vessels. Right carotid system: Right common and internal carotid arteries are mildly tortuous with patent without dissection. Minimal atheromatous plaque about the right carotid bulb without stenosis. Right carotid artery system partially medialized into the retropharyngeal space. Left carotid system: Left common and internal carotid arteries are mildly tortuous but patent without dissection. Left carotid artery system partially medialized into the retropharyngeal space. No  hemodynamically significant stenosis. Vertebral arteries: Proximal aspects of the vertebral arteries not well seen or assessed due to habitus and secondary streak artifact. Visualized portions of the vertebral arteries patent without stenosis or dissection. Skeleton: No worrisome osseous lesions. Prior ACDF at C6-7 with solid arthrodesis. Other neck: There is question of subtle asymmetric hyperattenuation of the right parotid gland as compared to the left (series 7, image 121). While this finding could be artifactual due to positioning, possible mild changes of para tightest could also potentially have this appearance. Prior right thyroidectomy. Upper chest: No other acute finding. Review of the MIP images confirms the above findings CTA HEAD FINDINGS Anterior circulation: There is mild atheromatous change about the carotid siphons without significant stenosis. A1 segments patent bilaterally. Normal anterior communicating complex. Anterior cerebral arteries patent without significant stenosis. Normal in stenosis or occlusion. Distal MCA branches perfused and symmetric. Posterior circulation: Both V4 segments patent without significant stenosis. Right vertebral artery dominant. Both PICA patent at their origins. Basilar patent without stenosis. Superior cerebellar and posterior cerebral arteries patent bilaterally. Venous sinuses: Patent allowing for timing the contrast bolus. Anatomic variants: As above. Serpiginous focus of contrast enhancement seen within the posterior aspect of the right lateral ventricle,  imbedded in the acute intraventricular hemorrhage (series 9, images 69-98). Finding consistent with active contrast extravasation/active bleeding. The intraventricular hemorrhage has worsened since prior. Review of the MIP images confirms the above findings IMPRESSION: 1. Serpiginous focus of contrast enhancement within the posterior aspect of the right lateral ventricle, imbedded in the acute intraventricular  hemorrhage. Finding consistent with active contrast extravasation/active bleeding. The intraventricular hemorrhage has worsened since prior. 2. Otherwise negative CTA of the head and neck. No large vessel occlusion or other emergent finding. No hemodynamically significant or correctable stenosis. 3. Question subtle asymmetric hyperattenuation of the right parotid gland as compared to the left. While this finding could be artifactual due to positioning, possible mild changes of parotitis could also potentially have this appearance. Correlation with physical exam recommended. Results discussed by telephone at the time of interpretation on 04/17/2024 at 1 a.m. to provider [facility] , who verbally acknowledged these results. Electronically Signed   By: Morene Hoard M.D.   On: 04/17/2024 01:37   CT HEAD POST STROKE FOLLOWUP/TIMED/STAT READ Result Date: 04/17/2024 CLINICAL DATA:  Follow-up examination for acute intracranial hemorrhage. EXAM: CT HEAD WITHOUT CONTRAST TECHNIQUE: Contiguous axial images were obtained from the base of the skull through the vertex without intravenous contrast. RADIATION DOSE REDUCTION: This exam was performed according to the departmental dose-optimization program which includes automated exposure control, adjustment of the mA and/or kV according to patient size and/or use of iterative reconstruction technique. COMPARISON:  Prior CTs from earlier the same day. FINDINGS: Brain: There has been interval worsening in previously identified intraventricular hemorrhage, with large volume hemorrhage now seen throughout the right lateral ventricle. Probable worsening dilatation of the underlying right lateral ventricle with ventricular trapping, worsened from prior. New subarachnoid extension with hemorrhage about the basilar cisterns, with additional trace subarachnoid blood within the left cerebral hemisphere. The underlying inciting right periventricular hemorrhage itself is  otherwise grossly similar. Mass effect with mild 2 mm right-to-left shift now seen. No other new intracranial hemorrhage. No other acute large vessel territory infarct. No extra-axial fluid collection. Vascular: Contrast material seen throughout the intracranial circulation related to prior CTA. Calcified atherosclerosis about the skull base. Skull: Scalp soft tissues and calvarium demonstrate no new finding. Sinuses/Orbits: Globes and orbital soft tissues within normal limits. Visualized paranasal sinuses are clear. No significant mastoid effusion. Other: None. IMPRESSION: 1. Interval worsening in previously identified intraventricular hemorrhage, with large volume hemorrhage now seen throughout the right lateral ventricle. Probable worsening dilatation of the underlying right lateral ventricle with ventricular trapping. New subarachnoid extension with hemorrhage about the basilar cisterns, with additional trace subarachnoid blood within the left cerebral hemisphere. 2. Progressive mass effect with early/mild 2 mm right-to-left midline shift. Results discussed by telephone at the time of interpretation on 04/17/2024 at approximately 1 a.m. to provider [facility]. Electronically Signed   By: Morene Hoard M.D.   On: 04/17/2024 01:14   CT HEAD CODE STROKE WO CONTRAST (LKW 0-4.5h, LVO 0-24h) Result Date: 04/16/2024 CLINICAL DATA:  Code stroke. Initial evaluation for acute neuro deficit, stroke suspected. EXAM: CT HEAD WITHOUT CONTRAST TECHNIQUE: Contiguous axial images were obtained from the base of the skull through the vertex without intravenous contrast. RADIATION DOSE REDUCTION: This exam was performed according to the departmental dose-optimization program which includes automated exposure control, adjustment of the mA and/or kV according to patient size and/or use of iterative reconstruction technique. COMPARISON:  Prior study from 07/27/2023. FINDINGS: Brain: Small acute intraparenchymal  hemorrhage involving the right posterior periventricular white matter  measures approximately 7 mm (series 2, image 19). No significant surrounding regional mass effect. Intraventricular extension with moderate volume hemorrhage within the right greater than left lateral ventricles, as well as the third and fourth ventricle. Lateral ventricles are dilated, consistent with associated hydrocephalus. No significant midline shift. No other acute intracranial hemorrhage. No other acute large vessel territory infarct. No mass lesion or extra-axial fluid collection. Vascular: No abnormal hyperdense vessel. Calcified atherosclerosis present at the skull base. Skull: Scalp soft tissues demonstrate no acute finding. Calvarium intact. Sinuses/Orbits: Globes orbital soft tissues within normal limits. Paranasal sinuses are largely clear. No significant mastoid effusion. Other: None. ASPECTS [facility] Stroke Program Early CT Score) Acute ICH, does not apply. IMPRESSION: 7 mm acute intraparenchymal hemorrhage involving the right posterior periventricular white matter. Intraventricular extension with moderate volume hemorrhage within the right greater than left lateral ventricles, as well as the third and fourth ventricle. Associated hydrocephalus. These results were communicated to Dr. Vanessa at 9:53 pm on 04/16/2024 by text page via the [facility] messaging system. Electronically Signed   By: Morene Hoard M.D.   On: 04/16/2024 21:53    PHYSICAL EXAM Obese middle-age African-American lady who is intubated and sedated. Cardiac exam regular heart sounds.  No murmur or gallop Lungs bilateral conducted sounds.  No rhonchi or rales Abdomen distended but soft nontender Distal pulses well felt.  Neurological Exam  : Patient is sedated and intubated.  Eyes are closed.  Pupils are 4 mm reactive.  Doll's eye movements are present.  Eyes are in primary position.  Minimal grimace to painful stimuli.  Partial flexion withdrawal  in  right upper and lower extremity and trace withdrawal in left lower extremity.  No withdrawal in the left upper extremity.  ASSESSMENT/PLAN [name] is a 69 y.o. 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 CT showed R brain IPH w/ IVH and developing hydrocephalus. Warfarin reversed. EVD placed following worsening hydrocephalus.    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 Hydrocephalus.  CTA head & neck worsening R lateral IVH. No LVO or stenoses. Possible R partoid hyperattenuation.  CT head 1/29 0040 perfusion worsening R IVH w/ ventricular trapping, new basilar SAH w/ trace SAH L brain. 2mm R to L midline shift CT head 1/29 0850 decreasing hydrocephalus, R frontal EVD, 2mm midline shift remains Repeat CT head am MRI  pending  2D Echo 07/2023 EF 60-65%. 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, reversed INR 3.1->1.2 Hx UGIB w/ polyp removal 08/2022; admission 07/2023 w/ supratherapeutic INR and anemia  Hypoxic Respiratory Failure Intubated 1/29 d/t neuro decline CCM on board  Hypertensive Emergency Home meds:  clonidine  0.1 bid, diltiazem  cd 240, lasix  40 Unstable on arrival, > 180 Now on Cleviprex  gtt SBP [place] 130-150  Hyperlipidemia Home meds:  crestor  10 Long-term  Bergemann < 70 Hold statin in setting of IPH Consider statin resumption at followup  Diabetes type II Uncontrolled Home meds:  metformin , ozempic  HgbA1c 6.5 in Sept, at [place] < 7.0 CBGs Recent Labs    04/16/24 2048 04/17/24 0501 04/17/24 0741  GLUCAP 187* 173* 126*    SSI  Dysphagia Secondary to stroke NPO Speech on board  Other Stroke Risk Factors Advanced Age >/= 78  Remote hx cocaine use (quit 2003, reab 2005). UDS pending (not yet collected) . Morbid Obesity, Body mass index is 45.19 kg/m. 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 HIV pending   Other Active Problems COPD on home O2 Depression Hx Seizure (no sx in 1.5-2y)  Hypokalemia 3.3, repeat in am IBS on Linzess  PTA Spinal stenosis w/p laminectomy Oct 2025 R adrenal adenoma, stable on CT Feb 2016  Hospital day # 1  Reena Gully, MSN, APRN, AGPCNP-BC, ANVP-BC, ASC-BC, NVRN-BC Advanced Practice Stroke Nurse [facility] See Amion for Schedule & Pager information 04/17/2024 11:35 AM   I have personally obtained history,examined this patient, reviewed notes, independently viewed imaging studies, participated in medical decision making and plan of care.ROS completed by me personally and pertinent positives fully documented  I have made any additions or clarifications directly to the above note. Agree with note above.  Patient with primary hypercoagulable disorder due to lupus anticoagulant on long-term warfarin presented with sudden onset of depressed mental status with left hemiplegia due to large right subcortical parenchymal hemorrhage likely hypertensive.  INR was 3.1 on admission and this was reversed with Kcentra  and INR this morning at 1.2.  Patient developed increasing hematoma size and decreased mental status and respiratory distress requiring  intubation and emergent ventriculostomy.  Unfortunately there have been not significant improvement in exam.  However follow-up CT from this morning shows slight improvement in hydrocephalus.  Continue close neurological monitoring and strict blood pressure control with systolic Clippinger between 130-150 for the first 24 hours and then below 160 thereafter.  Continue hypertonic saline with serum sodium Lubin 150-155.  Continue DVT and GI prophylaxis.  Prognosis is guarded.  Continue ventriculostomy drainage as per neurosurgery.  Appreciate help of critical care team and neurosurgery.  Long discussion at bedside with the patient's daughter and son and answered questions.  This patient is critically ill and at significant risk of neurological worsening, death and care requires constant monitoring of vital signs, hemodynamics,respiratory and cardiac monitoring, extensive review of multiple databases, frequent neurological assessment, discussion with family, other specialists and medical decision making of high complexity.I have made any additions or clarifications directly to the above note.This critical care time does not reflect procedure time, or teaching time or supervisory time of PA/NP/Med Resident etc but could involve care discussion time.  I spent 40 minutes of neurocritical care time  in the care of  this patient.      Eather Popp, MD Medical Director [facility] Pager: [phone] 04/17/2024 3:41 PM   To contact Stroke Continuity provider, please refer to [email or web address]. After hours, contact General Neurology

## 2024-04-17 NOTE — Progress Notes (Signed)
 Neurosurgery MD updated of no measurable EVD output since placement but with good tidaling in line. Instructed to drop the drain and raise the bed to see if this facilitates drainage. No improvement and no new orders at this time. MD to reassess during rounds in the morning.

## 2024-04-17 NOTE — Consult Note (Signed)
 "  NAME:  Tanekia Ryans Donath, MRN:  [id number], DOB:  1955-10-28, LOS: 1 ADMISSION DATE:  04/16/2024, CONSULTATION DATE:  04/17/24 REFERRING MD:  neuro, CHIEF COMPLAINT:  ich and declining mental status   History of Present Illness:  69 yo female presented with c/o headache n/v. Pt found to have acute R ICH with ventricular hemorrhage and moderate hydrocephalus.  Prior to presentation pt reportedly called her neighbor stating she was not feeling well and that her head hurt. She was complaining of n/v/d with concern she was having food poisoning. History was obtained from chart and family as while pt was following commands she was requiring relatively freq stimulation to arouse.   While initially, pt was able to arouse and follow commands with some L le weakness however during her time here her mentation continued to decline. Repeat scan revealed worsening bleed with new SAH and 2mm midline shift. Neurosx consulted and placed bedside EVD. No improvement in mentation. Now with sonorous respirations and unable to arouse. Habitus and chronic cpap use precludes pt to obstruct airway and decision was made to electively intubate at this time to protect airway 2/2 declining mental status. Family at bedside and updated on critical state, worsening bleed and mental status and need for intubation. Family confirms full code  Pertinent  Medical History  HTN Hyperlipidemia Dm2 with hyperglycemia Migraines Chronic a/c 2/2 + lupus and h/o bl dvt (coumadin ) OSA on cpap L spine stenosis s/p decompression  Significant Hospital Events: Including procedures, antibiotic start and stop dates in addition to other pertinent events   Admitted to neuro ICU 1/28 Evd placement 1/29 Ccm consulting for intubation 1/29  Interim History / Subjective:    Objective    Blood pressure (!) 145/70, pulse 89, temperature 98.5 F (36.9 C), temperature source Axillary, resp. rate (!) 22, height 5' 6 (1.676 m), weight 127 kg,  SpO2 98%.       No intake or output data in the 24 hours ending 04/17/24 0433 Filed Weights   04/16/24 2038  Weight: 127 kg    Examination: General: unresponsive, sonorous respirations HENT: [place], evd in place, pupils equal and reactive mmmp, edentulous on upper teeth Lungs: ctab Cardiovascular: rrr Abdomen: obese, nd, nt bs+ Extremities: no c/c + edema diffuse Neuro: unresponsive, not following commands, spontaneously moving R ue only GU: deferred  Resolved problem list   Assessment and Plan  Acute ICH with intraventricular extension Acute hydrocephalus Acute sah with midline shift  Acute htn emergency Acute hypoxic resp failure 2/2 above hypokalemia Dm2 Obesity osa -s/p evd per neuro/neurosx -bp control sbp 130-150 with cleviprex  -on 3%  -coumadin  reversed -mri pending -cva work up per neuro -will intubate for airway protection -monitor mental status and titrate vent for sat/sbt when able -vap prevention -cpap once extubated -will add q4 poc bs checks with coverage -replace e lytes -check mag   Labs   CBC: Recent Labs  Lab 04/16/24 2207 04/16/24 2343  WBC 9.4  --   HGB 11.9* 13.3  HCT 38.1 39.0  MCV 81.6  --   PLT 347  --     Basic Metabolic Panel: Recent Labs  Lab 04/16/24 2207 04/16/24 2316 04/16/24 2343  NA 142 139 141  K 4.0  --  3.3*  CL 102  --  102  CO2 24  --   --   GLUCOSE 186*  --  204*  BUN 15  --  15  CREATININE 0.90  --  0.90  CALCIUM  8.6*  --   --  GFR: Estimated Creatinine Clearance: 81.6 mL/min (by C-G formula based on SCr of 0.9 mg/dL). Recent Labs  Lab 04/16/24 2207  WBC 9.4    Liver Function Tests: Recent Labs  Lab 04/16/24 2207  AST 22  ALT 17  ALKPHOS 114  BILITOT 0.2  PROT 8.1  ALBUMIN  4.4   Recent Labs  Lab 04/16/24 2207  LIPASE 34   No results for input(s): AMMONIA in the last 168 hours.  ABG    Component Value Date/Time   PHART 7.463 (H) 03/14/2019 1603   PCO2ART 35.8 03/14/2019 1603    PO2ART 82.0 (L) 03/14/2019 1603   HCO3 25.3 02/28/2021 0800   TCO2 24 04/16/2024 2343   ACIDBASEDEF 2.0 03/09/2019 2011   O2SAT 74.0 02/28/2021 0800     Coagulation Profile: Recent Labs  Lab 04/16/24 2207 04/17/24 0149  INR 3.1* 1.3*    Cardiac Enzymes: No results for input(s): CKTOTAL, CKMB, CKMBINDEX, TROPONINI in the last 168 hours.  HbA1C: Hgb A1c MFr Bld  Date/Time Value Ref Range Status  12/17/2023 11:50 AM 6.5 (H) 4.8 - 5.6 % Final    Comment:    (NOTE) Diagnosis of Diabetes The following HbA1c ranges recommended by the American Diabetes Association (ADA) may be used as an aid in the diagnosis of diabetes mellitus.  Hemoglobin             Suggested A1C NGSP%              Diagnosis  <5.7                   Non Diabetic  5.7-6.4                Pre-Diabetic  >6.4                   Diabetic  <7.0                   Glycemic control for                       adults with diabetes.    07/28/2023 09:54 AM 6.5 (H) 4.8 - 5.6 % Final    Comment:    (NOTE) Pre diabetes:          5.7%-6.4%  Diabetes:              >6.4%  Glycemic control for   <7.0% adults with diabetes     CBG: Recent Labs  Lab 04/16/24 2048  GLUCAP 187*    Review of Systems:   Unobtainable 2/2 neuro status  Past Medical History:  She,  has a past medical history of Adrenal mass (03/226), Arthritis, Asthma, Clotting disorder, COPD (chronic obstructive pulmonary disease) (HCC), Depression, Diabetes mellitus, Dizziness, nonspecific, DVT (deep venous thrombosis) (HCC), Fibromyalgia, GERD (gastroesophageal reflux disease), Heart murmur, History of blood transfusion, History of cocaine abuse (HCC), History of kidney stones, Hyperlipidemia, Hypertension, Hypothyroidism, Lupus Anticoagulant Positive, On home oxygen  therapy, Seizure (HCC), Shortness of breath, and Sleep apnea.   Surgical History:   Past Surgical History:  Procedure Laterality Date   ABDOMINAL HYSTERECTOMY  1989    Fibroids   ANTERIOR LUMBAR FUSION  01/03/2012   Procedure: ANTERIOR LUMBAR FUSION 1 LEVEL;  Surgeon: Rockey LITTIE Peru, MD;  Location: [facility];  Service: Neurosurgery;  Laterality: N/A;  Lumbar Four-Five Anterior Lumbar Interbody Fusion with Instrumentation   BACK SURGERY     cervical spine---disk disease   CARPAL  TUNNEL RELEASE     Bilateral   CESAREAN SECTION     CHOLECYSTECTOMY     CYSTOSCOPY/RETROGRADE/URETEROSCOPY/STONE EXTRACTION WITH BASKET Right 04/26/2014   Procedure: CYSTOSCOPY/ RIGHT RETROGRADE/ RIGHT URETEROSCOPY/URETERAL AND RENAL PELVIS BIOPSY;  Surgeon: Ricardo Likens, MD;  Location: [facility];  Service: Urology;  Laterality: Right;   ESOPHAGOGASTRODUODENOSCOPY (EGD) WITH PROPOFOL  N/A 08/25/2022   Procedure: ESOPHAGOGASTRODUODENOSCOPY (EGD) WITH PROPOFOL ;  Surgeon: Rollin Dover, MD;  Location: [facility];  Service: Gastroenterology;  Laterality: N/A;   gallstones removed     HEMOSTASIS CLIP PLACEMENT  08/25/2022   Procedure: HEMOSTASIS CLIP PLACEMENT;  Surgeon: Rollin Dover, MD;  Location: [facility];  Service: Gastroenterology;;   HOT HEMOSTASIS N/A 08/25/2022   Procedure: HOT HEMOSTASIS (ARGON PLASMA COAGULATION/BICAP);  Surgeon: Rollin Dover, MD;  Location: [facility];  Service: Gastroenterology;  Laterality: N/A;   POLYPECTOMY  08/25/2022   Procedure: POLYPECTOMY;  Surgeon: Rollin Dover, MD;  Location: [facility];  Service: Gastroenterology;;   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   bilateral   right elbow surgery     RIGHT HEART CATH N/A 02/28/2021   Procedure: RIGHT HEART CATH;  Surgeon: Verlin Lonni BIRCH, MD;  Location: [facility];  Service: Cardiovascular;  Laterality: N/A;   THYROID  LOBECTOMY Right 02/27/2017   THYROID  LOBECTOMY Right 02/27/2017   Procedure: RIGHT THYROID  LOBECTOMY;  Surgeon: Vanderbilt Ned, MD;  Location: [facility];  Service: General;  Laterality: Right;   TUBAL LIGATION       Social History:   reports that she has never smoked. She  has never used smokeless tobacco. She reports that she does not currently use drugs after having used the following drugs: Cocaine. She reports that she does not drink alcohol.   Family History:  Her family history includes COPD in an other family member; Cancer in her mother; Diabetes in her mother; Heart failure in her father; Hyperlipidemia in an other family member; Hypertension in her father and mother.   Allergies Allergies[1]   Home Medications  Prior to Admission medications  Medication Sig Start Date End Date Taking? Authorizing Provider  acetaminophen  (TYLENOL ) 500 MG tablet Take 500-1,000 mg by mouth every 6 (six) hours as needed for moderate pain (pain score 4-6).    [provider]  albuterol  (ACCUNEB ) 0.63 MG/3ML nebulizer solution Take 3 mLs (0.63 mg total) by nebulization every 6 (six) hours as needed for wheezing. 11/21/23   Mannam, Praveen, MD  albuterol  (VENTOLIN  HFA) 108 (90 Base) MCG/ACT inhaler INHALE TWO (2) PUFFS BY MOUTH EVERY 4 HOURS AS NEEDED FOR SHORTNESS OF BREATH 09/12/23   Mercer Clotilda SAUNDERS, MD  ALLERGY  RELIEF 180 MG tablet Take 1 tablet (180 mg total) by mouth daily. 03/28/24   Mercer Clotilda SAUNDERS, MD  Blood Glucose Monitoring Suppl (ACCU-CHEK GUIDE) w/Device KIT USE TO TEST BLOOD SUGAR 3 TIMES DAILY 07/09/23   Mercer Clotilda SAUNDERS, MD  canagliflozin  (INVOKANA ) 100 MG TABS tablet TAKE 1 TABLET BY MOUTH DAILY BEFORE BREAKFAST *PATIENT MUST KEEP UPCOMING APPOINTMENT 02/2024 WITH CARDIOLOGIST FOR FURTHER REFILLS, FINAL ATTEMPT* 04/15/24   Santo Stanly LABOR, MD  cloNIDine  (CATAPRES ) 0.1 MG tablet TAKE ONE (1) TABLET BY MOUTH TWICE DAILY BEFORE BREAKFAST AND AT BEDTIME 02/13/24   Mercer Clotilda SAUNDERS, MD  diclofenac  sodium (VOLTAREN ) 1 % GEL Apply 2 g topically daily as needed (fibromyalgia pain- affected sites).    [provider]  diltiazem  (CARDIZEM  CD) 240 MG 24 hr capsule TAKE 1 CAPSULE BY MOUTH DAILY BEFORE BREAKFAST 09/11/23   Mercer,  Clotilda SAUNDERS, MD  DULoxetine   (CYMBALTA ) 30 MG capsule Take 90 mg by mouth at bedtime.    [provider]  FEROSUL 325 (65 Fe) MG tablet TAKE ONE TABLET BY MOUTH ONCE DAILY Patient not taking: Reported on 02/26/2024 07/19/21   Mercer Clotilda SAUNDERS, MD  fluticasone  (FLONASE ) 50 MCG/ACT nasal spray Place 2 sprays into both nostrils daily as needed for allergies. 08/09/23   [provider]  furosemide  (LASIX ) 80 MG tablet Take 0.5 tablets (40 mg total) by mouth daily. 02/05/24   Chandrasekhar, [name] A, MD  gabapentin  (NEURONTIN ) 300 MG capsule TAKE ONE (1) CAPSULE BY MOUTH TWICE DAILY 06/13/23   Mercer Clotilda SAUNDERS, MD  lidocaine  (LIDODERM ) 5 % APPLY 1 PATCH TOPICALLY ON THE SKIN DAILY AS NEEDED TO PAINFUL SITE *12 HOURS ON AND 12 HOURS OFF OR AS DIRECTED BY MD* 07/12/23   Mercer Clotilda SAUNDERS, MD  LINZESS  145 MCG CAPS capsule Take 1 capsule (145 mcg total) by mouth every other day. 11/09/23   Mercer Clotilda SAUNDERS, MD  metFORMIN  (GLUCOPHAGE ) 500 MG tablet TAKE 1 TABLET BY MOUTH EVERY DAY AT BEDTIME 09/11/23   Mercer Clotilda SAUNDERS, MD  omeprazole  (PRILOSEC) 20 MG capsule TAKE 1 CAPSULE BY MOUTH TWICE DAILY BEFORE A MEAL 11/09/23   Mercer Clotilda SAUNDERS, MD  oxyCODONE  (OXY IR/ROXICODONE ) 5 MG immediate release tablet Take 1 tablet (5 mg total) by mouth every 4 (four) hours as needed for moderate pain (pain score 4-6). 01/14/24   Gillie Duncans, MD  OZEMPIC , 2 MG/DOSE, 8 MG/3ML SOPN INJECT 2MG  SUBCUTANEOUSLY ONCE WEEKLY 11/09/23   Mercer Clotilda SAUNDERS, MD  potassium chloride  (MICRO-K ) 10 MEQ CR capsule TAKE TWO (2) CAPSULES BY MOUTH EVERY DAY AT BEDTIME 02/13/24   Mercer Clotilda SAUNDERS, MD  QUEtiapine  (SEROQUEL ) 100 MG tablet Take 150 mg by mouth at bedtime. 07/11/23   [provider]  rosuvastatin  (CRESTOR ) 10 MG tablet TAKE 1 TABLET BY MOUTH DAILY 04/15/24   Chandrasekhar, [name] A, MD  spironolactone  (ALDACTONE ) 25 MG tablet Take 0.5 tablets (12.5 mg total) by mouth daily. 01/07/24   Santo Stanly LABOR, MD  SYMBICORT  160-4.5 MCG/ACT inhaler  INHALE TWO (2) PUFFS BY MOUTH TWICE DAILY 02/13/24   Mercer Clotilda SAUNDERS, MD  tiZANidine  (ZANAFLEX ) 4 MG tablet Take 1 tablet (4 mg total) by mouth every 6 (six) hours as needed for muscle spasms. 01/14/24   Gillie Duncans, MD  warfarin (COUMADIN ) 10 MG tablet TAKE DAILY AS DIRECTED BY MD 01/10/24   Cloretta Arley NOVAK, MD  warfarin (COUMADIN ) 7.5 MG tablet Take 1 tablet (7.5 mg total) by mouth See admin instructions. Take 7.5mg  (1 tablet) by mouth on Sunday, Tuesday, Thursday, and Saturday. 01/09/24   Cloretta Arley NOVAK, MD  zonisamide  (ZONEGRAN ) 100 MG capsule TAKE 1 CAPSULE BY MOUTH EACH MORNING AND TAKE 2 CAPSULES IN THE EVENING 03/10/24   Mercer Clotilda SAUNDERS, MD     Critical care time:               [1]  Allergies Allergen Reactions   Lisinopril  Anaphylaxis, Swelling and Other (See Comments)    THROAT SWELLING, Pt states her throat started to close up after being on it for so long.    Prednisone  Anaphylaxis, Swelling and Other (See Comments)    Throat swelling and tongue irritation   Spironolactone  Other (See Comments)    Caused stroke-like symptoms with the mouth   Corticosteroids Rash    States she developed a rash on her tongue after a  steroid injection in her back   "

## 2024-04-17 NOTE — Progress Notes (Signed)
 OT Cancellation Note  Patient Details Name: [name] MRN: [id number] DOB: [birth year]   Cancelled Treatment:    Reason Eval/Treat Not Completed: Active bedrest order   Mateja Dier M. [place], OTR/L [facility] Acute Rehabilitation Services [phone] Secure Chat Preferred  Bowyn Mercier 04/17/2024, 7:17 AM

## 2024-04-17 NOTE — Progress Notes (Signed)
 Initial Nutrition Assessment  DOCUMENTATION CODES:   Morbid obesity  INTERVENTION:  Recommend initiating tube feeding within 24-48 h if unable to wean from vent  Initiate tube feeding via OG: initiate at 25 ml/h and increase 10 ml q6h until [place] rate reached Osmolite 1.5 at 45 ml/h (1080 ml per day) Prosource TF20 60 ml daily Provides 1700 kcal, 87 gm protein, 822 ml free water  daily  NUTRITION DIAGNOSIS:   Inadequate oral intake related to inability to eat as evidenced by NPO status.  [place]:   Patient will meet greater than or equal to 90% of their needs  MONITOR:   Vent status  REASON FOR ASSESSMENT:   Ventilator    ASSESSMENT:   Pt with hx of HLD, HTN, diabetes, OSA on CPAP, COPD, and lupus. Admitted w/ c/o of headache and n/v, found to have R ICH but mentation worsened and repeat scan showed worsening bleed with new SAH and 2 mm midline shift.  1/29 admitted, EVD placed, intubated  Patient is currently intubated on ventilator support. Discussed with RN who suspects pt may need Cortrak placed tomorrow. Family at bedside. States pt had good appetite PTA and ate well. Pt not picky and did not avoid any foods. Recently, pt started feeling like she had food poisoning and was experiencing n/v.   Family states no recent weight changes, but pt has been on lasix  routinely. Pt also deals with chronic constipation and has been taking Ozempic  since August.  MV: 10.7 L/min Temp (24hrs), Avg:98.5 F (36.9 C), Min:97.7 F (36.5 C), Max:99.9 F (37.7 C) MAP (cuff): 82 mmHg  Propofol : 26.7 ml/hr (provides additional 704 lipid calories per day at current rate)  Admit weight: 127 kg   Drains/Lines: EVD: 4 ml x 6 h OG gastric per xray Urinary Catheter: UOP 1 unmeasured   Nutritionally Relevant Medications: Scheduled Meds:  insulin  aspart  0-15 Units Subcutaneous Q4H   pantoprazole  (PROTONIX ) IV  40 mg Intravenous QHS   polyethylene glycol  17 g Per Tube Daily   potassium  chloride  40 mEq Per Tube Once   senna  1 tablet Per Tube BID   Continuous Infusions:  cefTRIAXone  (ROCEPHIN )  IV Stopped (04/17/24 0630)   clevidipine  12 mg/hr (04/17/24 1200)   fentaNYL  infusion INTRAVENOUS 50 mcg/hr (04/17/24 1205)   propofol  (DIPRIVAN ) infusion 35 mcg/kg/min (04/17/24 1200)   sodium chloride  (hypertonic) 75 mL/hr at 04/17/24 1200   PRN Meds:.acetaminophen  **OR** acetaminophen  (TYLENOL ) oral liquid 160 mg/5 mL **OR** acetaminophen , fentaNYL   Labs Reviewed: Sodium 144 (Ennen 150-155) Potassium 3.6<--3.3 Corrected calcium : 8.28 (low) Magnesium  1.7 (WNL) CBG ranges from 121-187 mg/dL over the last 24 hours HgbA1c 6.5 (from 11/2023)  NUTRITION - FOCUSED PHYSICAL EXAM:  Flowsheet Row Most Recent Value  Orbital Region No depletion  Upper Arm Region Unable to assess  Thoracic and Lumbar Region No depletion  Buccal Region Unable to assess  Temple Region Mild depletion  Clavicle Bone Region No depletion  Clavicle and Acromion Bone Region No depletion  Scapular Bone Region No depletion  Dorsal Hand Unable to assess  Patellar Region Unable to assess  Anterior Thigh Region Unable to assess  Posterior Calf Region Unable to assess  Edema (RD Assessment) Mild  [non pitting,  BUE and BLE]  Hair Reviewed  Eyes Unable to assess  Mouth Unable to assess  Skin Reviewed  Nails Reviewed    Diet Order:   Diet Order             Diet NPO  time specified  Diet effective now                   EDUCATION NEEDS:   No education needs have been identified at this time  Skin:  Skin Assessment: Reviewed RN Assessment  Last BM:  unknown  Height:   Ht Readings from Last 1 Encounters:  04/17/24 5' 6 (1.676 m)    Weight:   Wt Readings from Last 1 Encounters:  04/16/24 127 kg    Ideal Body Weight:  59.1 kg  BMI:  Body mass index is 45.19 kg/m.  Estimated Nutritional Needs:   Kcal:  1700-1900  Protein:  90-110g  Fluid:  1.7-1.9L    Josette Glance, MS, RDN, LDN Clinical Dietitian I Please reach out via secure chat

## 2024-04-17 NOTE — Progress Notes (Addendum)
 Was notified of a change in exam. Difficult to arouse and now has a R gaze preference and prefers to move her right side more than left. Repeat INR after [employer]  is still pending. Pupils are sluggish BL. STAT CT head ordered and I met the patient in the scanner and there is a definite worsening of exam and now barely arouses. BL pupils are still round and sluggish response to bright light.  I immediately called Meyran with Neurosurgery to let them know about the exam now and evaluate candidacy for EVD placement.  Discussed with RN Nidia and will see what we can do to expedite PT/INR.  CT with worsening IVH and R ventricle trapping with transependymal flow of CSF. Discusse with Dr. Micky with the Neuro rads and discussed with Meyran with Neurosurgery. She is on her way to evaluate patient.  Calling daughter to give her an update.   Evangeline Utley [facility]

## 2024-04-17 NOTE — Progress Notes (Signed)
 PT Cancellation Note  Patient Details Name: Claribel Sachs Wawrzyniak MRN: [id number] DOB: August 29, 1955   Cancelled Treatment:    Reason Eval/Treat Not Completed: Patient not medically ready;Medical issues which prohibited therapy.  Not likely to come off bedrest in time for therapy today.  Will await word that she is off. 04/17/2024  [place] HERO., PT Acute Rehabilitation Services [phone]  (office)   Vinie GAILS Lowen [place]ouri 04/17/2024, 10:47 AM

## 2024-04-18 ENCOUNTER — Inpatient Hospital Stay (HOSPITAL_COMMUNITY)

## 2024-04-18 DIAGNOSIS — J690 Pneumonitis due to inhalation of food and vomit: Secondary | ICD-10-CM

## 2024-04-18 DIAGNOSIS — I82401 Acute embolism and thrombosis of unspecified deep veins of right lower extremity: Secondary | ICD-10-CM | POA: Diagnosis not present

## 2024-04-18 DIAGNOSIS — I82402 Acute embolism and thrombosis of unspecified deep veins of left lower extremity: Secondary | ICD-10-CM | POA: Diagnosis not present

## 2024-04-18 DIAGNOSIS — I609 Nontraumatic subarachnoid hemorrhage, unspecified: Secondary | ICD-10-CM | POA: Diagnosis not present

## 2024-04-18 DIAGNOSIS — Z8739 Personal history of other diseases of the musculoskeletal system and connective tissue: Secondary | ICD-10-CM | POA: Diagnosis not present

## 2024-04-18 DIAGNOSIS — I610 Nontraumatic intracerebral hemorrhage in hemisphere, subcortical: Secondary | ICD-10-CM | POA: Diagnosis not present

## 2024-04-18 DIAGNOSIS — G4733 Obstructive sleep apnea (adult) (pediatric): Secondary | ICD-10-CM | POA: Diagnosis not present

## 2024-04-18 DIAGNOSIS — I615 Nontraumatic intracerebral hemorrhage, intraventricular: Secondary | ICD-10-CM | POA: Diagnosis not present

## 2024-04-18 DIAGNOSIS — I10 Essential (primary) hypertension: Secondary | ICD-10-CM | POA: Diagnosis not present

## 2024-04-18 DIAGNOSIS — I161 Hypertensive emergency: Secondary | ICD-10-CM | POA: Diagnosis not present

## 2024-04-18 DIAGNOSIS — Z7901 Long term (current) use of anticoagulants: Secondary | ICD-10-CM | POA: Diagnosis not present

## 2024-04-18 DIAGNOSIS — J9601 Acute respiratory failure with hypoxia: Secondary | ICD-10-CM | POA: Diagnosis not present

## 2024-04-18 DIAGNOSIS — E119 Type 2 diabetes mellitus without complications: Secondary | ICD-10-CM | POA: Diagnosis not present

## 2024-04-18 LAB — SODIUM
Sodium: 153 mmol/L — ABNORMAL HIGH (ref 135–145)
Sodium: 154 mmol/L — ABNORMAL HIGH (ref 135–145)
Sodium: 155 mmol/L — ABNORMAL HIGH (ref 135–145)
Sodium: 155 mmol/L — ABNORMAL HIGH (ref 135–145)

## 2024-04-18 LAB — MAGNESIUM: Magnesium: 2 mg/dL (ref 1.7–2.4)

## 2024-04-18 LAB — PHOSPHORUS: Phosphorus: 1.6 mg/dL — ABNORMAL LOW (ref 2.5–4.6)

## 2024-04-18 LAB — GLUCOSE, CAPILLARY
Glucose-Capillary: 125 mg/dL — ABNORMAL HIGH (ref 70–99)
Glucose-Capillary: 129 mg/dL — ABNORMAL HIGH (ref 70–99)
Glucose-Capillary: 143 mg/dL — ABNORMAL HIGH (ref 70–99)
Glucose-Capillary: 169 mg/dL — ABNORMAL HIGH (ref 70–99)
Glucose-Capillary: 165 mg/dL — ABNORMAL HIGH (ref 70–99)

## 2024-04-18 LAB — TRIGLYCERIDES: Triglycerides: 242 mg/dL — ABNORMAL HIGH

## 2024-04-18 LAB — MISC LABCORP TEST (SEND OUT): Labcorp test code: 83935

## 2024-04-18 LAB — PROTIME-INR
INR: 1.2 (ref 0.8–1.2)
Prothrombin Time: 16.1 s — ABNORMAL HIGH (ref 11.4–15.2)

## 2024-04-18 MED ORDER — ORAL CARE MOUTH RINSE: 15.0000 mL | OROMUCOSAL | Status: AC | PRN

## 2024-04-18 MED ORDER — THIAMINE MONONITRATE 100 MG PO TABS
100.0000 mg | ORAL_TABLET | Freq: Every day | ORAL | Status: AC
  Administered 2024-04-18 – 2024-04-24 (×7): 100 mg
  Filled 2024-04-18 (×7): qty 1

## 2024-04-18 MED ORDER — DEXMEDETOMIDINE HCL IN NACL 400 MCG/100ML IV SOLN
0.0000 ug/kg/h | INTRAVENOUS | Status: DC
  Administered 2024-04-18 – 2024-04-19 (×3): 0.4 ug/kg/h via INTRAVENOUS
  Filled 2024-04-18 (×3): qty 100

## 2024-04-18 MED ORDER — HYDRALAZINE HCL 20 MG/ML IJ SOLN
5.0000 mg | INTRAMUSCULAR | Status: DC | PRN
  Administered 2024-04-18: 5 mg via INTRAVENOUS
  Filled 2024-04-18: qty 1

## 2024-04-18 MED ORDER — QUETIAPINE FUMARATE 50 MG PO TABS
150.0000 mg | ORAL_TABLET | Freq: Every day | ORAL | Status: DC
  Administered 2024-04-18 – 2024-04-19 (×2): 150 mg
  Filled 2024-04-18 (×2): qty 1

## 2024-04-18 MED ORDER — DILTIAZEM HCL 60 MG PO TABS
60.0000 mg | ORAL_TABLET | Freq: Four times a day (QID) | ORAL | Status: DC
  Administered 2024-04-18 – 2024-04-20 (×8): 60 mg
  Filled 2024-04-18 (×7): qty 1

## 2024-04-18 MED ORDER — CLONIDINE HCL 0.1 MG PO TABS
0.1000 mg | ORAL_TABLET | Freq: Two times a day (BID) | ORAL | Status: DC
  Administered 2024-04-18 – 2024-04-19 (×4): 0.1 mg
  Filled 2024-04-18 (×3): qty 1

## 2024-04-18 MED ORDER — DILTIAZEM HCL 60 MG PO TABS
60.0000 mg | ORAL_TABLET | Freq: Four times a day (QID) | ORAL | Status: DC
  Filled 2024-04-18: qty 1

## 2024-04-18 MED ORDER — PROSOURCE TF20 ENFIT COMPATIBL EN LIQD
60.0000 mL | Freq: Every day | ENTERAL | Status: AC
  Administered 2024-04-18 – 2024-04-24 (×7): 60 mL
  Filled 2024-04-18 (×7): qty 60

## 2024-04-18 MED ORDER — LABETALOL HCL 5 MG/ML IV SOLN: 10.0000 mg | INTRAVENOUS | Status: DC | PRN

## 2024-04-18 MED ORDER — ENOXAPARIN SODIUM 40 MG/0.4ML IJ SOSY
40.0000 mg | PREFILLED_SYRINGE | INTRAMUSCULAR | Status: DC
  Administered 2024-04-18 – 2024-04-23 (×6): 40 mg via SUBCUTANEOUS
  Filled 2024-04-18 (×6): qty 0.4

## 2024-04-18 MED ORDER — CLONIDINE HCL 0.1 MG PO TABS
0.1000 mg | ORAL_TABLET | Freq: Two times a day (BID) | ORAL | Status: DC
  Filled 2024-04-18: qty 1

## 2024-04-18 MED ORDER — ORAL CARE MOUTH RINSE
15.0000 mL | OROMUCOSAL | Status: AC
  Administered 2024-04-18 – 2024-04-25 (×88): 15 mL via OROMUCOSAL

## 2024-04-18 MED ORDER — OSMOLITE 1.5 CAL PO LIQD
1000.0000 mL | ORAL | Status: AC
  Administered 2024-04-18 – 2024-04-22 (×5): 1000 mL

## 2024-04-18 MED FILL — Fentanyl Citrate-NaCl 0.9% IV Soln 2.5 MG/250ML: INTRAVENOUS | Qty: 250 | Status: AC

## 2024-04-18 NOTE — Progress Notes (Signed)
 OT Cancellation Note  Patient Details Name: [name] MRN: [id number] DOB: [birth year]   Cancelled Treatment:    Reason Eval/Treat Not Completed: Patient not medically ready (EVD/ Vent? slightly w/d in all extremities, no eye opening, on minimum sedation)) OT to hold revisit until Monday 2/2 due to medical readiness unless significant change in status.  Ely Molt 04/18/2024, 8:14 AM

## 2024-04-18 NOTE — Progress Notes (Signed)
 Patient was transported from 4N-25 to CT on the ventilator. RT x2 and RN were present, VSS throughout trip and no complications were noted. RT will continue to monitor.

## 2024-04-18 NOTE — Procedures (Signed)
 Cortrak  Person Inserting Tube:  [name], Olivia SAUNDERS, RD Tube Type:  Cortrak - 43 inches Tube Size:  10 Tube Location:  Left nare Secured by: Bridle Initial Placement:  Gastric Technique Used to Measure Tube Placement:  Marking at nare/corner of mouth Cortrak Secured At:  65 cm Initial Placement Verification:  Cortrak device (Registered Dieticians Only)  Cortrak Tube Team Note:  Consult received to place a Cortrak feeding tube.   No x-ray is required. RN may begin using tube.   If the tube becomes dislodged please keep the tube and contact the Cortrak team at [email or web address] for replacement.  If after hours and replacement cannot be delayed, place a NG tube and confirm placement with an abdominal x-ray.    Olivia Kenning, RD Registered Dietitian  See Amion for more information

## 2024-04-18 NOTE — Progress Notes (Signed)
 Subjective: Patient reports patient is intubated  Objective: Vital signs in last 24 hours: Temp:  [98.6 F (37 C)-100.4 F (38 C)] 98.6 F (37 C) (01/30 1103) Pulse Rate:  [90-116] 113 (01/30 1600) Resp:  [12-34] 25 (01/30 1600) BP: (121-185)/(60-140) 153/99 (01/30 1600) SpO2:  [95 %-100 %] 98 % (01/30 1600) FiO2 (%):  [40 %] 40 % (01/30 1445)  Intake/Output from previous day: 01/29 0701 - 01/30 0700 In: 2445 [I.V.:2345; IV Piggyback:100] Out: 1281 [Urine:1254; Drains:27] Intake/Output this shift: Total I/O In: 199.3 [I.V.:124.3; NG/GT:75] Out: 1205 [Urine:1200; Drains:5]  Increase spontaneous movement on the right still left hemiparesis  Lab Results: Recent Labs    04/16/24 2207 04/16/24 2343 04/17/24 0542  WBC 9.4  --   --   HGB 11.9* 13.3 12.2  HCT 38.1 39.0 36.0  PLT 347  --   --    BMET Recent Labs    04/16/24 2207 04/16/24 2316 04/16/24 2343 04/17/24 0535 04/17/24 0542 04/17/24 0954 04/18/24 1225 04/18/24 1559  NA 142   < > 141   < > 146*   < > 155* 153*  K 4.0  --  3.3*  --  3.6  --   --   --   CL 102  --  102  --   --   --   --   --   CO2 24  --   --   --   --   --   --   --   GLUCOSE 186*  --  204*  --   --   --   --   --   BUN 15  --  15  --   --   --   --   --   CREATININE 0.90  --  0.90  --   --   --   --   --   CALCIUM  8.6*  --   --   --   --   --   --   --    < > = values in this interval not displayed.    Studies/Results: CT HEAD WO CONTRAST ( ) Result Date: 04/18/2024 EXAM: CT HEAD WITHOUT CONTRAST 04/18/2024 04:37:28 AM TECHNIQUE: CT of the head was performed without the administration of intravenous contrast. Automated exposure control, iterative reconstruction, and/or weight based adjustment of the mA/kV was utilized to reduce the radiation dose to as low as reasonably achievable. COMPARISON: 04/17/2024 CLINICAL HISTORY: Stroke, follow-up. FINDINGS: BRAIN AND VENTRICLES: No significant interval change in appearance of bilateral  intraventricular hemorrhage, primarily within the right lateral ventricle. Blood is again demonstrated within the third and fourth ventricles. Similar subarachnoid hemorrhage along bilateral cerebral convexities and sylvian fissures. Redemonstrated right frontal approach ventriculostomy catheter with similar degree of hydrocephalus. Unchanged 5 mm right to left midline shift. No evidence of acute infarct. ORBITS: Bilateral lens replacement noted. SINUSES: No acute abnormality. SOFT TISSUES AND SKULL: No acute soft tissue abnormality. No skull fracture. IMPRESSION: 1. No significant interval change in appearance of bilateral intraventricular hemorrhage, primarily within the right lateral ventricle, with blood again demonstrated within the third and fourth ventricles. 2. Unchanged 5 mm right to left midline shift. 3. Similar subarachnoid hemorrhage along bilateral cerebral convexities and sylvian fissures. 4. Redemonstrated right frontal approach ventriculostomy catheter with similar degree of hydrocephalus. Electronically signed by: Evalene Coho MD 04/18/2024 04:54 AM EST RP Workstation: [id number]   MR BRAIN W WO CONTRAST Result Date: 04/17/2024 CLINICAL DATA:  Follow-up examination for neuro deficit,  intracranial hemorrhage. EXAM: MRI HEAD WITHOUT AND WITH CONTRAST TECHNIQUE: Multiplanar, multiecho pulse sequences of the brain and surrounding structures were obtained without and with intravenous contrast. CONTRAST:  10mL GADAVIST  GADOBUTROL  1 MMOL/ML IV SOLN COMPARISON:  Comparison made with prior CT from earlier the same day as well as previous exams. FINDINGS: Brain: Examination degraded by motion artifact. Previously identified large intraventricular hemorrhage involving the right greater than left lateral ventricles, as well as the third and fourth ventricles again seen. Probable extension to involve the parenchyma of the adjacent right temporal lobe noted. Overall, size and degree of hemorrhage is  relatively similar as compared to most recent head CT from earlier today. No underlying mass lesion or abnormal enhancement seen within this region following contrast administration. Associated vasogenic edema throughout the adjacent right temporal occipital region with regional mass effect and approximately 4 mm of right-to-left shift. Right frontal approach EVD in place with tip in the left lateral ventricle. Stable ventricular size and morphology without worsened hydrocephalus. Associated small volume subarachnoid hemorrhage about the basilar cisterns again noted, similar. Few patchy foci of restricted diffusion are seen about the falx (series 2, images 37, 38 as well as within the periventricular and peripheral posterior right cerebral hemisphere (series 2, images 27, 30, 35, 17), consistent with small acute ischemic infarcts, and likely secondary to the edema/shift within the right cerebral hemisphere. No associated hemorrhage or mass effect about these areas of ischemia. Otherwise, gray-white matter differentiation otherwise maintained. No other evidence for prior or chronic intracranial hemorrhage. No other mass lesion. No extra-axial fluid collection. Mild leptomeningeal enhancement about the right cerebral hemisphere and right periventricular region related to underlying edema. No other abnormal enhancement Vascular: Major intracranial vascular flow voids are maintained. Skull and upper cervical spine: Cranial junction within normal limits. Bone marrow signal intensity overall within normal limits. No other scalp soft tissue abnormality. Sinuses/Orbits: Prior bilateral ocular lens replacement. Paranasal sinuses are largely clear. No significant mastoid effusion. Other: None. IMPRESSION: 1. Large intraventricular/periventricular hemorrhage as above, relatively stable as compared to most recent head CT from earlier today. No underlying mass lesion or abnormal enhancement. 2. Associated vasogenic edema  throughout the adjacent right temporoccipital region with regional mass effect and approximately 4 mm of right-to-left shift. 3. Few patchy foci of restricted diffusion about the falx and within the periventricular and peripheral right cerebral hemisphere, consistent with small acute ischemic infarcts. These are likely secondary to the edema/shift within the right cerebral hemisphere. 4. Right frontal approach EVD in place with tip in the left lateral ventricle. Stable ventricular size and morphology without worsened hydrocephalus. 5. Small volume subarachnoid hemorrhage about the basilar cisterns, stable. Electronically Signed   By: Morene Hoard M.D.   On: 04/17/2024 20:18   DG Abd Portable 1V Result Date: 04/17/2024 CLINICAL DATA:  Orogastric tube placement. EXAM: PORTABLE ABDOMEN - 1 VIEW COMPARISON:  January 06, 2019 FINDINGS: An enteric tube is seen with its distal tip overlying the expected region of the gastric antrum versus duodenal bulb. The bowel gas pattern is normal. Radiopaque surgical clips seen overlying the right upper quadrant. No radio-opaque calculi or other significant radiographic abnormality are noted. Postoperative changes are present within the lower lumbar spine. IMPRESSION: Enteric tube positioning, as described above. Electronically Signed   By: Suzen Dials M.D.   On: 04/17/2024 16:10   US  EKG SITE RITE Result Date: 04/17/2024 If Site Rite image not attached, placement could not be confirmed due to current cardiac rhythm.  CT HEAD  WO CONTRAST ( ) Result Date: 04/17/2024 EXAM: CT HEAD WITHOUT CONTRAST 04/17/2024 09:17:03 AM TECHNIQUE: CT of the head was performed without the administration of intravenous contrast. Automated exposure control, iterative reconstruction, and/or weight based adjustment of the mA/kV was utilized to reduce the radiation dose to as low as reasonably achievable. COMPARISON: 04/17/2024 CLINICAL HISTORY: Hydrocephalus. FINDINGS: BRAIN AND  VENTRICLES: Redemonstration of right temporal lobe hemorrhage which measures 6.9 x 3.8 x 3.4 cm, previously measuring 6.8 x 3.3 cm. Overall, the extent of hemorrhage is similar to slightly decreased. There is redemonstrated intraventricular extension with hyperattenuating clot filling and distending the right lateral ventricle. Additional blood products in the left frontal horn are similar. Interval placement of right frontal approach ventriculostomy catheter crossing midline and terminating in the region of the foramen of Monro. Ventricles are overall similar in caliber. Slightly decreased volume of subarachnoid hemorrhage in the ambient cisterns and quadrigeminal cistern. Blood products in the third and fourth ventricles appear similar. There are no new areas of intracranial hemorrhage. Similar mass effect and diffuse sulcal effacement in the right cerebral hemisphere. There is unchanged 2 mm leftward midline shift. Subarachnoid hemorrhage over the cerebral convexities and within the sylvian fissures are similar to prior. No evidence of acute infarct. No extra-axial collection. ORBITS: Bilateral lens replacements. SINUSES: No acute abnormality. SOFT TISSUES AND SKULL: Right frontal burr hole. Soft tissue swelling and locules of gas within the right sided scalp related to ventricular catheter placement. No skull fracture. IMPRESSION: 1. Redemonstrated right temporal lobe hemorrhage with intraventricular extension and associated mass effect, similar to slightly decreased from prior. 2. Ventricular caliber is similar to slightly decreased following interval right frontal ventriculostomy catheter placement, terminating near the foramen of Monro. 3. Unchanged 2 mm leftward midline shift. Electronically signed by: Donnice Mania MD 04/17/2024 10:01 AM EST RP Workstation: [id number]   DG CHEST PORT 1 VIEW Result Date: 04/17/2024 CLINICAL DATA:  Endotracheal tube evaluation. EXAM: PORTABLE CHEST 1 VIEW COMPARISON:   Earlier in the day at 5:16 a.m. FINDINGS: 7:50 a.m.  Endotracheal tube terminates 4.2 cm above carina. Nasogastric tube extends beyond the  inferior aspect of the film. Numerous leads and wires project over the chest. Mildly degraded exam due to AP portable technique and patient body habitus. Moderate cardiomegaly. No pleural effusion or pneumothorax. Suspect new right lateral lower lobe airspace disease, presumably atelectasis. Minimal subsegmental atelectasis at the left lung base. Low lung volumes with resultant pulmonary interstitial prominence. IMPRESSION: Appropriate position of endotracheal tube after readjustment. Cardiomegaly and low lung volumes with bibasilar atelectasis. Electronically Signed   By: Rockey Kilts M.D.   On: 04/17/2024 08:17   DG Chest Port 1 View Result Date: 04/17/2024 EXAM: 1 VIEW(S) XRAY OF THE CHEST 04/17/2024 05:25:01 AM COMPARISON: 07/27/2023 CLINICAL HISTORY: Endotracheally intubated. FINDINGS: LINES, TUBES AND DEVICES: Endotracheal tube within lobe position directed towards the right mainstem bronchus. Consider withdrawal at least 2 cm. LUNGS AND PLEURA: Low lung volumes. Asymmetric hazy opacification of the left lung, which may reflect posterior layering pleural effusion versus technical artifact related to patient positioning. Patchy densities in the left base likely reflect atelectasis. No pneumothorax. HEART AND MEDIASTINUM: No acute abnormality of the cardiac and mediastinal silhouettes. BONES AND SOFT TISSUES: Cervical spine fusion hardware noted. No acute osseous abnormality. IMPRESSION: 1. Endotracheal tube is low-lying and directed toward the right mainstem bronchus; recommend withdrawal at least 2 cm and repeat chest radiograph to confirm position. 2. Asymmetric hazy opacification of the left lung, favored to represent dependent atelectasis  with possible small posterior layering pleural effusion, with positioning artifact less likely; consider repeat upright/decubitus  radiograph or bedside ultrasound as needed to further assess for pleural fluid. 3. Patchy densities at the left base likely reflect atelectasis; follow-up chest radiograph can be obtained to document improvement or if respiratory status worsens. 4. The urgent finding will be called to the ordering provider by the [facility] Radiology [facility]ist[facility] (PRAs) and documented in the [name] dashboard. Electronically signed by: Waddell Calk MD 04/17/2024 06:14 AM EST RP Workstation: [id number]   CT ANGIO HEAD NECK W WO CM Result Date: 04/17/2024 CLINICAL DATA:  Initial evaluation for acute neuro deficit, stroke suspected. EXAM: CT ANGIOGRAPHY HEAD AND NECK WITH AND WITHOUT CONTRAST TECHNIQUE: Multidetector CT imaging of the head and neck was performed using the standard protocol during bolus administration of intravenous contrast. Multiplanar CT image reconstructions and MIPs were obtained to evaluate the vascular anatomy. Carotid stenosis measurements (when applicable) are obtained utilizing NASCET criteria, using the distal internal carotid diameter as the denominator. RADIATION DOSE REDUCTION: This exam was performed according to the departmental dose-optimization program which includes automated exposure control, adjustment of the mA and/or kV according to patient size and/or use of iterative reconstruction technique. CONTRAST:  75mL OMNIPAQUE  IOHEXOL  350 MG/ML SOLN COMPARISON:  Comparison made with CT from 04/08/2024. FINDINGS: CTA NECK FINDINGS Aortic arch: Visualized aortic arch within normal limits for caliber. Bovine branching pattern noted. No significant stenosis about the origin the great vessels. Right carotid system: Right common and internal carotid arteries are mildly tortuous with patent without dissection. Minimal atheromatous plaque about the right carotid bulb without stenosis. Right carotid artery system partially medialized into the retropharyngeal space. Left carotid system: Left common and internal  carotid arteries are mildly tortuous but patent without dissection. Left carotid artery system partially medialized into the retropharyngeal space. No hemodynamically significant stenosis. Vertebral arteries: Proximal aspects of the vertebral arteries not well seen or assessed due to habitus and secondary streak artifact. Visualized portions of the vertebral arteries patent without stenosis or dissection. Skeleton: No worrisome osseous lesions. Prior ACDF at C6-7 with solid arthrodesis. Other neck: There is question of subtle asymmetric hyperattenuation of the right parotid gland as compared to the left (series 7, image 121). While this finding could be artifactual due to positioning, possible mild changes of para tightest could also potentially have this appearance. Prior right thyroidectomy. Upper chest: No other acute finding. Review of the MIP images confirms the above findings CTA HEAD FINDINGS Anterior circulation: There is mild atheromatous change about the carotid siphons without significant stenosis. A1 segments patent bilaterally. Normal anterior communicating complex. Anterior cerebral arteries patent without significant stenosis. Normal in stenosis or occlusion. Distal MCA branches perfused and symmetric. Posterior circulation: Both V4 segments patent without significant stenosis. Right vertebral artery dominant. Both PICA patent at their origins. Basilar patent without stenosis. Superior cerebellar and posterior cerebral arteries patent bilaterally. Venous sinuses: Patent allowing for timing the contrast bolus. Anatomic variants: As above. Serpiginous focus of contrast enhancement seen within the posterior aspect of the right lateral ventricle, imbedded in the acute intraventricular hemorrhage (series 9, images 69-98). Finding consistent with active contrast extravasation/active bleeding. The intraventricular hemorrhage has worsened since prior. Review of the MIP images confirms the above findings  IMPRESSION: 1. Serpiginous focus of contrast enhancement within the posterior aspect of the right lateral ventricle, imbedded in the acute intraventricular hemorrhage. Finding consistent with active contrast extravasation/active bleeding. The intraventricular hemorrhage has worsened since prior. 2. Otherwise negative CTA of  the head and neck. No large vessel occlusion or other emergent finding. No hemodynamically significant or correctable stenosis. 3. Question subtle asymmetric hyperattenuation of the right parotid gland as compared to the left. While this finding could be artifactual due to positioning, possible mild changes of parotitis could also potentially have this appearance. Correlation with physical exam recommended. Results discussed by telephone at the time of interpretation on 04/17/2024 at 1 a.m. to provider [facility] , who verbally acknowledged these results. Electronically Signed   By: Morene Hoard M.D.   On: 04/17/2024 01:37   CT HEAD POST STROKE FOLLOWUP/TIMED/STAT READ Result Date: 04/17/2024 CLINICAL DATA:  Follow-up examination for acute intracranial hemorrhage. EXAM: CT HEAD WITHOUT CONTRAST TECHNIQUE: Contiguous axial images were obtained from the base of the skull through the vertex without intravenous contrast. RADIATION DOSE REDUCTION: This exam was performed according to the departmental dose-optimization program which includes automated exposure control, adjustment of the mA and/or kV according to patient size and/or use of iterative reconstruction technique. COMPARISON:  Prior CTs from earlier the same day. FINDINGS: Brain: There has been interval worsening in previously identified intraventricular hemorrhage, with large volume hemorrhage now seen throughout the right lateral ventricle. Probable worsening dilatation of the underlying right lateral ventricle with ventricular trapping, worsened from prior. New subarachnoid extension with hemorrhage about the basilar  cisterns, with additional trace subarachnoid blood within the left cerebral hemisphere. The underlying inciting right periventricular hemorrhage itself is otherwise grossly similar. Mass effect with mild 2 mm right-to-left shift now seen. No other new intracranial hemorrhage. No other acute large vessel territory infarct. No extra-axial fluid collection. Vascular: Contrast material seen throughout the intracranial circulation related to prior CTA. Calcified atherosclerosis about the skull base. Skull: Scalp soft tissues and calvarium demonstrate no new finding. Sinuses/Orbits: Globes and orbital soft tissues within normal limits. Visualized paranasal sinuses are clear. No significant mastoid effusion. Other: None. IMPRESSION: 1. Interval worsening in previously identified intraventricular hemorrhage, with large volume hemorrhage now seen throughout the right lateral ventricle. Probable worsening dilatation of the underlying right lateral ventricle with ventricular trapping. New subarachnoid extension with hemorrhage about the basilar cisterns, with additional trace subarachnoid blood within the left cerebral hemisphere. 2. Progressive mass effect with early/mild 2 mm right-to-left midline shift. Results discussed by telephone at the time of interpretation on 04/17/2024 at approximately 1 a.m. to provider [facility]. Electronically Signed   By: Morene Hoard M.D.   On: 04/17/2024 01:14   CT HEAD CODE STROKE WO CONTRAST (LKW 0-4.5h, LVO 0-24h) Result Date: 04/16/2024 CLINICAL DATA:  Code stroke. Initial evaluation for acute neuro deficit, stroke suspected. EXAM: CT HEAD WITHOUT CONTRAST TECHNIQUE: Contiguous axial images were obtained from the base of the skull through the vertex without intravenous contrast. RADIATION DOSE REDUCTION: This exam was performed according to the departmental dose-optimization program which includes automated exposure control, adjustment of the mA and/or kV according to  patient size and/or use of iterative reconstruction technique. COMPARISON:  Prior study from 07/27/2023. FINDINGS: Brain: Small acute intraparenchymal hemorrhage involving the right posterior periventricular white matter measures approximately 7 mm (series 2, image 19). No significant surrounding regional mass effect. Intraventricular extension with moderate volume hemorrhage within the right greater than left lateral ventricles, as well as the third and fourth ventricle. Lateral ventricles are dilated, consistent with associated hydrocephalus. No significant midline shift. No other acute intracranial hemorrhage. No other acute large vessel territory infarct. No mass lesion or extra-axial fluid collection. Vascular: No abnormal hyperdense vessel. Calcified atherosclerosis present  at the skull base. Skull: Scalp soft tissues demonstrate no acute finding. Calvarium intact. Sinuses/Orbits: Globes orbital soft tissues within normal limits. Paranasal sinuses are largely clear. No significant mastoid effusion. Other: None. ASPECTS [facility] Stroke Program Early CT Score) Acute ICH, does not apply. IMPRESSION: 7 mm acute intraparenchymal hemorrhage involving the right posterior periventricular white matter. Intraventricular extension with moderate volume hemorrhage within the right greater than left lateral ventricles, as well as the third and fourth ventricle. Associated hydrocephalus. These results were communicated to Dr. Vanessa at 9:53 pm on 04/16/2024 by text page via the [facility] messaging system. Electronically Signed   By: Morene Hoard M.D.   On: 04/16/2024 21:53    Assessment/Plan: Hospital day 2 external ventricular drain day 2 it is putting out a couple cc every couple hours certainly not brisk output.  Follow-up CT scan does show catheter tip in good position extensive mount of interventricular hemorrhage around it so certainly draining around the clot however there does appear to be CSF space around  the 3rd and 4th ventricle so I do think she is clearing some of her spinal fluid on her own and certainly I do not see any increased ventricular size.  So probably continue to watch the drainage could consider tPA however patient is at moderate risk with her other comorbidities and intracerebral hemorrhage  LOS: 2 days     [name] 04/18/2024, 5:30 PM

## 2024-04-18 NOTE — Progress Notes (Signed)
 Nutrition Brief Note  Consult received to start enteral nutrition support Pt discussed during ICU rounds and with RN and MD.  Noted pt on ozemic PTA. Remains intubated with OG Plan for cortrak placement today.   Initiate tube feeding via Cortrak: initiate at 25 ml/h and increase 10 ml q6h until [place] rate reached Osmolite 1.5 at 45 ml/h (1080 ml per day) Prosource TF20 60 ml daily Provides 1700 kcal, 87 gm protein, 822 ml free water  daily  100 mg thiamine  daily    Refeeding Labs: Recent Labs  Lab 04/16/24 2207 04/16/24 2343 04/17/24 0535 04/17/24 0542 04/18/24 1225  K 4.0 3.3*  --  3.6  --   MG  --   --  1.7  --  2.0  PHOS  --   --   --   --  1.6*   Glucose Profile:  Recent Labs    04/18/24 0733 04/18/24 1100 04/18/24 1512  GLUCAP 129* 143* 169*    Tyrelle Raczka P., RD, LDN, CNSC See AMiON for contact information

## 2024-04-18 NOTE — Progress Notes (Signed)
 "  NAME:  [name], MRN:  [id number], DOB:  1955/10/06, LOS: 2 ADMISSION DATE:  04/16/2024, CONSULTATION DATE:  04/17/24 REFERRING MD:  neuro, CHIEF COMPLAINT:  ich and declining mental status   History of Present Illness:  69 yo female presented with c/o headache n/v. Pt found to have acute R ICH with ventricular hemorrhage and moderate hydrocephalus.  Prior to presentation pt reportedly called her neighbor stating she was not feeling well and that her head hurt. She was complaining of n/v/d with concern she was having food poisoning. History was obtained from chart and family as while pt was following commands she was requiring relatively freq stimulation to arouse.   While initially, pt was able to arouse and follow commands with some L le weakness however during her time here her mentation continued to decline. Repeat scan revealed worsening bleed with new SAH and 2mm midline shift. Neurosx consulted and placed bedside EVD. No improvement in mentation. Now with sonorous respirations and unable to arouse. Habitus and chronic cpap use precludes pt to obstruct airway and decision was made to electively intubate at this time to protect airway 2/2 declining mental status. Family at bedside and updated on critical state, worsening bleed and mental status and need for intubation. Family confirms full code  Pertinent  Medical History  HTN Hyperlipidemia Dm2 with hyperglycemia Migraines Chronic a/c 2/2 + lupus and h/o bl dvt (coumadin ) OSA on cpap L spine stenosis s/p decompression  Significant Hospital Events: Including procedures, antibiotic start and stop dates in addition to other pertinent events   Admitted to neuro ICU 1/28 Evd placement 1/29 Ccm consulted for intubation 1/29  Interim History / Subjective:   Patient remains intubated and sedated. Daughter updated at bedside.   Objective    Blood pressure 133/73, pulse 98, temperature (!) 100.4 F (38 C), temperature source  Axillary, resp. rate 16, height 5' 6 (1.676 m), weight 127 kg, SpO2 100%.    Vent Mode: PRVC FiO2 (%):  [40 %] 40 % Set Rate:  [20 bmp] 20 bmp Vt Set:  [470 mL] 470 mL PEEP:  [5 cmH20] 5 cmH20 Plateau Pressure:  [18 cmH20-22 cmH20] 18 cmH20   Intake/Output Summary (Last 24 hours) at 04/18/2024 0956 Last data filed at 04/18/2024 0939 Gross per 24 hour  Intake 2326.95 ml  Output 1507 ml  Net 819.95 ml   Filed Weights   04/16/24 2038  Weight: 127 kg   Physical Exam Vitals and nursing note reviewed.  Constitutional:      Appearance: She is obese.     Comments: Intubated and sedated   HENT:     Head: Normocephalic.     Comments: EVD     Right Ear: External ear normal.     Left Ear: External ear normal.     Nose: Nose normal.  Eyes:     Comments: Sluggish but equal and reactive  Cardiovascular:     Rate and Rhythm: Normal rate and regular rhythm.  Pulmonary:     Effort: No respiratory distress.     Breath sounds: Normal breath sounds. No wheezing.     Comments: Intubated  Abdominal:     General: Abdomen is flat. Bowel sounds are normal. There is no distension.     Palpations: Abdomen is soft.  Musculoskeletal:        General: Swelling (non pitting) present.  Neurological:     Comments: Intubated and sedated, pupils sluggish but equal/reactive.       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 Takes Coumadin  with INR 3.1 on arrival, reversed with Kcentra  and Vit K. Patient became difficult to arouse shortly after admission, with R gaze preference, and difficulty moving left side. Repeat CTH showed worsening hemorrhage with subarachnoid hemorrhage and 2mm mass effect. NSGY placed EVD 1/29. -Neurology following -Liebert SBP < 160  -Attempt to wean sedation   -On Cleviprex . 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  -follows with hematology -positive lupus anticoagulant, beta-2 -glycoprotein IgA antibody   Type II Diabetes  -PTA: metformin  on hold   Hypertension -PTA clonidine  and cardizem  resumed   OSA -uses cpap at home  History of Spinal stenosis s/p lumbar laminectomy 12/2023  Review of Systems:   Intubated and sedated   Past Medical History:  She,  has a past medical history of Adrenal mass (03/226), Arthritis, Asthma, Clotting disorder, COPD (chronic obstructive pulmonary disease) (HCC), Depression, Diabetes mellitus, Dizziness, nonspecific, DVT (deep venous thrombosis) (HCC), Fibromyalgia, GERD (gastroesophageal reflux disease), Heart murmur, History of blood transfusion, History of cocaine abuse (HCC), History of kidney stones, Hyperlipidemia, Hypertension, Hypothyroidism, Lupus Anticoagulant Positive, On home oxygen  therapy, Seizure (HCC), Shortness of breath, and Sleep apnea.   Surgical History:   Past Surgical History:  Procedure Laterality Date   ABDOMINAL HYSTERECTOMY  1989   Fibroids   ANTERIOR LUMBAR FUSION  01/03/2012   Procedure: ANTERIOR LUMBAR FUSION 1 LEVEL;  Surgeon: Rockey LITTIE Peru, MD;  Location: [facility];  Service: Neurosurgery;  Laterality: N/A;  Lumbar Four-Five Anterior Lumbar Interbody Fusion with Instrumentation   BACK SURGERY     cervical spine---disk disease   CARPAL TUNNEL RELEASE     Bilateral   CESAREAN SECTION     CHOLECYSTECTOMY     CYSTOSCOPY/RETROGRADE/URETEROSCOPY/STONE  EXTRACTION WITH BASKET Right 04/26/2014   Procedure: CYSTOSCOPY/ RIGHT RETROGRADE/ RIGHT URETEROSCOPY/URETERAL AND RENAL PELVIS BIOPSY;  Surgeon: Ricardo Likens, MD;  Location: [facility];  Service: Urology;  Laterality: Right;   ESOPHAGOGASTRODUODENOSCOPY (EGD) WITH PROPOFOL  N/A 08/25/2022   Procedure: ESOPHAGOGASTRODUODENOSCOPY (EGD) WITH PROPOFOL ;  Surgeon: Rollin Dover, MD;  Location: [facility];  Service: Gastroenterology;  Laterality: N/A;   gallstones removed     HEMOSTASIS CLIP PLACEMENT  08/25/2022   Procedure: HEMOSTASIS CLIP PLACEMENT;  Surgeon: Rollin Dover, MD;  Location: [facility];  Service: Gastroenterology;;   HOT HEMOSTASIS N/A 08/25/2022   Procedure: HOT HEMOSTASIS (ARGON PLASMA COAGULATION/BICAP);  Surgeon: Rollin Dover, MD;  Location: [facility];  Service: Gastroenterology;  Laterality: N/A;   POLYPECTOMY  08/25/2022   Procedure: POLYPECTOMY;  Surgeon: Rollin Dover, MD;  Location: [facility];  Service: Gastroenterology;;   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   bilateral   right elbow surgery     RIGHT HEART CATH N/A 02/28/2021   Procedure: RIGHT HEART CATH;  Surgeon: Verlin Lonni BIRCH, MD;  Location: [facility];  Service: Cardiovascular;  Laterality: N/A;   THYROID  LOBECTOMY Right 02/27/2017   THYROID  LOBECTOMY Right  02/27/2017   Procedure: RIGHT THYROID  LOBECTOMY;  Surgeon: Vanderbilt Ned, MD;  Location: [facility];  Service: General;  Laterality: Right;   TUBAL LIGATION       Social History:   reports that she has never smoked. She has never used smokeless tobacco. She reports that she does not currently use drugs after having used the following drugs: Cocaine. She reports that she does not drink alcohol.   Family History:  Her family history includes COPD in an other family member; Cancer in her mother; Diabetes in her mother; Heart failure in her father; Hyperlipidemia in an other family member; Hypertension in her father and mother.    Allergies Allergies[1]   Home Medications  Prior to Admission medications  Medication Sig Start Date End Date Taking? Authorizing Provider  acetaminophen  (TYLENOL ) 500 MG tablet Take 500-1,000 mg by mouth every 6 (six) hours as needed for moderate pain (pain score 4-6).    [provider]  albuterol  (ACCUNEB ) 0.63 MG/3ML nebulizer solution Take 3 mLs (0.63 mg total) by nebulization every 6 (six) hours as needed for wheezing. 11/21/23   Mannam, Praveen, MD  albuterol  (VENTOLIN  HFA) 108 (90 Base) MCG/ACT inhaler INHALE TWO (2) PUFFS BY MOUTH EVERY 4 HOURS AS NEEDED FOR SHORTNESS OF BREATH 09/12/23   Mercer Clotilda SAUNDERS, MD  ALLERGY  RELIEF 180 MG tablet Take 1 tablet (180 mg total) by mouth daily. 03/28/24   Mercer Clotilda SAUNDERS, MD  Blood Glucose Monitoring Suppl (ACCU-CHEK GUIDE) w/Device KIT USE TO TEST BLOOD SUGAR 3 TIMES DAILY 07/09/23   Mercer Clotilda SAUNDERS, MD  canagliflozin  (INVOKANA ) 100 MG TABS tablet TAKE 1 TABLET BY MOUTH DAILY BEFORE BREAKFAST *PATIENT MUST KEEP UPCOMING APPOINTMENT 02/2024 WITH CARDIOLOGIST FOR FURTHER REFILLS, FINAL ATTEMPT* 04/15/24   Santo Stanly LABOR, MD  cloNIDine  (CATAPRES ) 0.1 MG tablet TAKE ONE (1) TABLET BY MOUTH TWICE DAILY BEFORE BREAKFAST AND AT BEDTIME 02/13/24   Mercer Clotilda SAUNDERS, MD  diclofenac  sodium (VOLTAREN ) 1 % GEL Apply 2 g topically daily as needed (fibromyalgia pain- affected sites).    [provider]  diltiazem  (CARDIZEM  CD) 240 MG 24 hr capsule TAKE 1 CAPSULE BY MOUTH DAILY BEFORE BREAKFAST 09/11/23   Mercer Clotilda SAUNDERS, MD  DULoxetine  (CYMBALTA ) 30 MG capsule Take 90 mg by mouth at bedtime.    [provider]  FEROSUL 325 (65 Fe) MG tablet TAKE ONE TABLET BY MOUTH ONCE DAILY Patient not taking: Reported on 02/26/2024 07/19/21   Mercer Clotilda SAUNDERS, MD  fluticasone  (FLONASE ) 50 MCG/ACT nasal spray Place 2 sprays into both nostrils daily as needed for allergies. 08/09/23   [provider]  furosemide  (LASIX ) 80 MG tablet Take  0.5 tablets (40 mg total) by mouth daily. 02/05/24   Chandrasekhar, Stanly A, MD  gabapentin  (NEURONTIN ) 300 MG capsule TAKE ONE (1) CAPSULE BY MOUTH TWICE DAILY 06/13/23   Mercer Clotilda SAUNDERS, MD  lidocaine  (LIDODERM ) 5 % APPLY 1 PATCH TOPICALLY ON THE SKIN DAILY AS NEEDED TO PAINFUL SITE *12 HOURS ON AND 12 HOURS OFF OR AS DIRECTED BY MD* 07/12/23   Mercer Clotilda SAUNDERS, MD  LINZESS  145 MCG CAPS capsule Take 1 capsule (145 mcg total) by mouth every other day. 11/09/23   Mercer Clotilda SAUNDERS, MD  metFORMIN  (GLUCOPHAGE ) 500 MG tablet TAKE 1 TABLET BY MOUTH EVERY DAY AT BEDTIME 09/11/23   Mercer Clotilda SAUNDERS, MD  omeprazole  (PRILOSEC) 20 MG capsule TAKE 1 CAPSULE BY MOUTH TWICE DAILY BEFORE A MEAL 11/09/23   Mercer Clotilda SAUNDERS, MD  oxyCODONE  (OXY IR/ROXICODONE ) 5 MG immediate release tablet Take 1 tablet (5 mg total) by mouth every 4 (four) hours as needed for moderate pain (pain score 4-6). 01/14/24   Gillie Duncans, MD  OZEMPIC , 2 MG/DOSE, 8 MG/3ML SOPN INJECT 2MG  SUBCUTANEOUSLY ONCE WEEKLY 11/09/23   Mercer Clotilda SAUNDERS, MD  potassium chloride  (MICRO-K ) 10 MEQ CR capsule TAKE TWO (2) CAPSULES BY MOUTH EVERY DAY AT BEDTIME 02/13/24   Mercer Clotilda SAUNDERS, MD  QUEtiapine  (SEROQUEL ) 100 MG tablet Take 150 mg by mouth at bedtime. 07/11/23   [provider]  rosuvastatin  (CRESTOR ) 10 MG tablet TAKE 1 TABLET BY MOUTH DAILY 04/15/24   Chandrasekhar, [name] A, MD  spironolactone  (ALDACTONE ) 25 MG tablet Take 0.5 tablets (12.5 mg total) by mouth daily. 01/07/24   Santo Stanly LABOR, MD  SYMBICORT  160-4.5 MCG/ACT inhaler INHALE TWO (2) PUFFS BY MOUTH TWICE DAILY 02/13/24   Mercer Clotilda SAUNDERS, MD  tiZANidine  (ZANAFLEX ) 4 MG tablet Take 1 tablet (4 mg total) by mouth every 6 (six) hours as needed for muscle spasms. 01/14/24   Gillie Duncans, MD  warfarin (COUMADIN ) 10 MG tablet TAKE DAILY AS DIRECTED BY MD 01/10/24   Cloretta Arley NOVAK, MD  warfarin (COUMADIN ) 7.5 MG tablet Take 1 tablet (7.5 mg total) by mouth See admin  instructions. Take 7.5mg  (1 tablet) by mouth on Sunday, Tuesday, Thursday, and Saturday. 01/09/24   Cloretta Arley NOVAK, MD  zonisamide  (ZONEGRAN ) 100 MG capsule TAKE 1 CAPSULE BY MOUTH EACH MORNING AND TAKE 2 CAPSULES IN THE EVENING 03/10/24   Mercer Clotilda SAUNDERS, MD     Critical care time: 31 minutes    Keven Fila, PA-C [facility] For pager details, please see AMION or use Epic chat  After 1900, please call [facility] for cross coverage needs 04/18/2024, 9:56 AM            [1]  Allergies Allergen Reactions   Lisinopril  Anaphylaxis, Swelling and Other (See Comments)    THROAT SWELLING, Pt states her throat started to close up after being on it for so long.    Prednisone  Anaphylaxis, Swelling and Other (See Comments)    Throat swelling and tongue irritation   Spironolactone  Other (See Comments)    Caused stroke-like symptoms with the mouth   Corticosteroids Rash    States she developed a rash on her tongue after a steroid injection in her back   "

## 2024-04-18 NOTE — Progress Notes (Signed)
 SLP Cancellation Note  Patient Details Name: [name] MRN: [id number] DOB: Jul 03, 1955   Cancelled treatment:       Reason Eval/Treat Not Completed: Medical issues which prohibited therapy; remains intubated.  Turrell Severt L. Vona, MA CCC/SLP Clinical Specialist - Acute Care SLP Acute Rehabilitation Services Office number [phone]   Vona Palma Laurice 04/18/2024, 9:29 AM

## 2024-04-19 DIAGNOSIS — J690 Pneumonitis due to inhalation of food and vomit: Secondary | ICD-10-CM | POA: Diagnosis not present

## 2024-04-19 DIAGNOSIS — I10 Essential (primary) hypertension: Secondary | ICD-10-CM | POA: Diagnosis not present

## 2024-04-19 DIAGNOSIS — E876 Hypokalemia: Secondary | ICD-10-CM | POA: Diagnosis not present

## 2024-04-19 DIAGNOSIS — G936 Cerebral edema: Secondary | ICD-10-CM | POA: Diagnosis not present

## 2024-04-19 DIAGNOSIS — G4733 Obstructive sleep apnea (adult) (pediatric): Secondary | ICD-10-CM | POA: Diagnosis not present

## 2024-04-19 DIAGNOSIS — I69391 Dysphagia following cerebral infarction: Secondary | ICD-10-CM | POA: Diagnosis not present

## 2024-04-19 DIAGNOSIS — I609 Nontraumatic subarachnoid hemorrhage, unspecified: Secondary | ICD-10-CM | POA: Diagnosis not present

## 2024-04-19 DIAGNOSIS — G911 Obstructive hydrocephalus: Secondary | ICD-10-CM | POA: Diagnosis not present

## 2024-04-19 DIAGNOSIS — G919 Hydrocephalus, unspecified: Secondary | ICD-10-CM | POA: Diagnosis not present

## 2024-04-19 DIAGNOSIS — I161 Hypertensive emergency: Secondary | ICD-10-CM | POA: Diagnosis not present

## 2024-04-19 DIAGNOSIS — E119 Type 2 diabetes mellitus without complications: Secondary | ICD-10-CM | POA: Diagnosis not present

## 2024-04-19 DIAGNOSIS — J9601 Acute respiratory failure with hypoxia: Secondary | ICD-10-CM | POA: Diagnosis not present

## 2024-04-19 DIAGNOSIS — I615 Nontraumatic intracerebral hemorrhage, intraventricular: Secondary | ICD-10-CM | POA: Diagnosis not present

## 2024-04-19 DIAGNOSIS — I610 Nontraumatic intracerebral hemorrhage in hemisphere, subcortical: Secondary | ICD-10-CM | POA: Diagnosis not present

## 2024-04-19 DIAGNOSIS — Z982 Presence of cerebrospinal fluid drainage device: Secondary | ICD-10-CM | POA: Diagnosis not present

## 2024-04-19 DIAGNOSIS — D6862 Lupus anticoagulant syndrome: Secondary | ICD-10-CM | POA: Diagnosis not present

## 2024-04-19 LAB — LIPID PANEL
Cholesterol: 169 mg/dL (ref 0–200)
HDL: 60 mg/dL
LDL Cholesterol: 79 mg/dL (ref 0–99)
Total CHOL/HDL Ratio: 2.8 ratio
Triglycerides: 151 mg/dL — ABNORMAL HIGH
VLDL: 30 mg/dL (ref 0–40)

## 2024-04-19 LAB — CBC
HCT: 29.5 % — ABNORMAL LOW (ref 36.0–46.0)
Hemoglobin: 9.2 g/dL — ABNORMAL LOW (ref 12.0–15.0)
MCH: 25.6 pg — ABNORMAL LOW (ref 26.0–34.0)
MCHC: 31.2 g/dL (ref 30.0–36.0)
MCV: 81.9 fL (ref 80.0–100.0)
Platelets: 297 10*3/uL (ref 150–400)
RBC: 3.6 MIL/uL — ABNORMAL LOW (ref 3.87–5.11)
RDW: 18.3 % — ABNORMAL HIGH (ref 11.5–15.5)
WBC: 11.1 10*3/uL — ABNORMAL HIGH (ref 4.0–10.5)
nRBC: 0.3 % — ABNORMAL HIGH (ref 0.0–0.2)

## 2024-04-19 LAB — BASIC METABOLIC PANEL WITH GFR
Anion gap: 14 (ref 5–15)
BUN: 16 mg/dL (ref 8–23)
CO2: 21 mmol/L — ABNORMAL LOW (ref 22–32)
Calcium: 7.7 mg/dL — ABNORMAL LOW (ref 8.9–10.3)
Chloride: 120 mmol/L — ABNORMAL HIGH (ref 98–111)
Creatinine, Ser: 0.87 mg/dL (ref 0.44–1.00)
GFR, Estimated: >60 mL/min
Glucose, Bld: 191 mg/dL — ABNORMAL HIGH (ref 70–99)
Potassium: 3 mmol/L — ABNORMAL LOW (ref 3.5–5.1)
Sodium: 155 mmol/L — ABNORMAL HIGH (ref 135–145)

## 2024-04-19 LAB — URINALYSIS, ROUTINE W REFLEX MICROSCOPIC
Bilirubin Urine: NEGATIVE
Glucose, UA: 50 mg/dL — AB
Ketones, ur: NEGATIVE mg/dL
Leukocytes,Ua: NEGATIVE
Nitrite: NEGATIVE
Protein, ur: 100 mg/dL — AB
Specific Gravity, Urine: 1.025 (ref 1.005–1.030)
pH: 5 (ref 5.0–8.0)

## 2024-04-19 LAB — PHOSPHORUS
Phosphorus: 1.4 mg/dL — ABNORMAL LOW (ref 2.5–4.6)
Phosphorus: 2.2 mg/dL — ABNORMAL LOW (ref 2.5–4.6)

## 2024-04-19 LAB — URINE DRUG SCREEN
Amphetamines: NEGATIVE
Barbiturates: NEGATIVE
Benzodiazepines: POSITIVE — AB
Cocaine: NEGATIVE
Fentanyl: POSITIVE — AB
Methadone Scn, Ur: NEGATIVE
Opiates: NEGATIVE
Tetrahydrocannabinol: NEGATIVE

## 2024-04-19 LAB — HEMOGLOBIN A1C
Hgb A1c MFr Bld: 6.9 % — ABNORMAL HIGH (ref 4.8–5.6)
Mean Plasma Glucose: 151.33 mg/dL

## 2024-04-19 LAB — GLUCOSE, CAPILLARY
Glucose-Capillary: 119 mg/dL — ABNORMAL HIGH (ref 70–99)
Glucose-Capillary: 124 mg/dL — ABNORMAL HIGH (ref 70–99)
Glucose-Capillary: 134 mg/dL — ABNORMAL HIGH (ref 70–99)
Glucose-Capillary: 142 mg/dL — ABNORMAL HIGH (ref 70–99)
Glucose-Capillary: 142 mg/dL — ABNORMAL HIGH (ref 70–99)
Glucose-Capillary: 148 mg/dL — ABNORMAL HIGH (ref 70–99)
Glucose-Capillary: 205 mg/dL — ABNORMAL HIGH (ref 70–99)

## 2024-04-19 LAB — MAGNESIUM: Magnesium: 2.1 mg/dL (ref 1.7–2.4)

## 2024-04-19 LAB — SODIUM
Sodium: 154 mmol/L — ABNORMAL HIGH (ref 135–145)
Sodium: 153 mmol/L — ABNORMAL HIGH (ref 135–145)

## 2024-04-19 MED ORDER — POTASSIUM CHLORIDE 20 MEQ PO PACK
40.0000 meq | PACK | ORAL | Status: AC
  Administered 2024-04-19 (×2): 40 meq
  Filled 2024-04-19 (×2): qty 2

## 2024-04-19 MED ORDER — CALCIUM GLUCONATE-NACL 2-0.675 GM/100ML-% IV SOLN
2.0000 g | Freq: Once | INTRAVENOUS | Status: AC
  Administered 2024-04-19: 2000 mg via INTRAVENOUS
  Filled 2024-04-19: qty 100

## 2024-04-19 MED ORDER — LABETALOL HCL 5 MG/ML IV SOLN
10.0000 mg | Freq: Four times a day (QID) | INTRAVENOUS | Status: DC | PRN
  Administered 2024-04-19 – 2024-04-21 (×3): 10 mg via INTRAVENOUS
  Filled 2024-04-19 (×2): qty 4

## 2024-04-19 MED ORDER — SODIUM PHOSPHATES 45 MMOLE/15ML IV SOLN
45.0000 mmol | Freq: Once | INTRAVENOUS | Status: AC
  Administered 2024-04-19: 45 mmol via INTRAVENOUS
  Filled 2024-04-19: qty 15

## 2024-04-19 NOTE — Progress Notes (Signed)
 VENTILATOR WEAN NOTE 04/19/2024  Start Mode: PRVC  Wean Mode: Pressure Support  Duration before failure:   Reason for failure: N/A  Notes: RT attempted vent wean PS/CPAP 5/5. Patient tolerating well at this time. RN notified.

## 2024-04-19 NOTE — Progress Notes (Signed)
 Subjective: No acute events overnight  Objective: Vital signs in last 24 hours: Temp:  [98.6 F (37 C)-100.4 F (38 C)] 99.9 F (37.7 C) (01/31 0830) Pulse Rate:  [72-116] 72 (01/31 0900) Resp:  [0-48] 13 (01/31 0900) BP: (117-185)/(59-99) 139/76 (01/31 0900) SpO2:  [97 %-100 %] 100 % (01/31 0900) FiO2 (%):  [40 %] 40 % (01/31 0756) Weight:  [123.8 kg] 123.8 kg (01/31 0500)  Intake/Output from previous day: 01/30 0701 - 01/31 0700 In: 395.8 [I.V.:175.8; NG/GT:220] Out: 1220 [Urine:1200; Drains:20] Intake/Output this shift: Total I/O In: 749.4 [I.V.:151.4; NG/GT:498; IV Piggyback:100] Out: -   Intubated, sedated Eyes closed, pupils 3 mm sluggishly reactive Withdraws on right side to pain EVD in place, patent.  Drained 14 mL at 10 cm  Lab Results: Recent Labs    04/16/24 2207 04/16/24 2343 04/17/24 0542 04/19/24 0427  WBC 9.4  --   --  11.1*  HGB 11.9*   < > 12.2 9.2*  HCT 38.1   < > 36.0 29.5*  PLT 347  --   --  297   < > = values in this interval not displayed.   BMET Recent Labs    04/16/24 2207 04/16/24 2316 04/16/24 2343 04/17/24 0535 04/17/24 0542 04/17/24 0954 04/18/24 2336 04/19/24 0427  NA 142   < > 141   < > 146*   < > 154* 155*  K 4.0  --  3.3*  --  3.6  --   --  3.0*  CL 102  --  102  --   --   --   --  120*  CO2 24  --   --   --   --   --   --  21*  GLUCOSE 186*  --  204*  --   --   --   --  191*  BUN 15  --  15  --   --   --   --  16  CREATININE 0.90  --  0.90  --   --   --   --  0.87  CALCIUM  8.6*  --   --   --   --   --   --  7.7*   < > = values in this interval not displayed.    Studies/Results: CT HEAD WO CONTRAST ( ) Result Date: 04/18/2024 EXAM: CT HEAD WITHOUT CONTRAST 04/18/2024 04:37:28 AM TECHNIQUE: CT of the head was performed without the administration of intravenous contrast. Automated exposure control, iterative reconstruction, and/or weight based adjustment of the mA/kV was utilized to reduce the radiation dose to as low  as reasonably achievable. COMPARISON: 04/17/2024 CLINICAL HISTORY: Stroke, follow-up. FINDINGS: BRAIN AND VENTRICLES: No significant interval change in appearance of bilateral intraventricular hemorrhage, primarily within the right lateral ventricle. Blood is again demonstrated within the third and fourth ventricles. Similar subarachnoid hemorrhage along bilateral cerebral convexities and sylvian fissures. Redemonstrated right frontal approach ventriculostomy catheter with similar degree of hydrocephalus. Unchanged 5 mm right to left midline shift. No evidence of acute infarct. ORBITS: Bilateral lens replacement noted. SINUSES: No acute abnormality. SOFT TISSUES AND SKULL: No acute soft tissue abnormality. No skull fracture. IMPRESSION: 1. No significant interval change in appearance of bilateral intraventricular hemorrhage, primarily within the right lateral ventricle, with blood again demonstrated within the third and fourth ventricles. 2. Unchanged 5 mm right to left midline shift. 3. Similar subarachnoid hemorrhage along bilateral cerebral convexities and sylvian fissures. 4. Redemonstrated right frontal approach ventriculostomy catheter with similar degree  of hydrocephalus. Electronically signed by: Evalene Coho MD 04/18/2024 04:54 AM EST RP Workstation: [id number]   MR BRAIN W WO CONTRAST Result Date: 04/17/2024 CLINICAL DATA:  Follow-up examination for neuro deficit, intracranial hemorrhage. EXAM: MRI HEAD WITHOUT AND WITH CONTRAST TECHNIQUE: Multiplanar, multiecho pulse sequences of the brain and surrounding structures were obtained without and with intravenous contrast. CONTRAST:  10mL GADAVIST  GADOBUTROL  1 MMOL/ML IV SOLN COMPARISON:  Comparison made with prior CT from earlier the same day as well as previous exams. FINDINGS: Brain: Examination degraded by motion artifact. Previously identified large intraventricular hemorrhage involving the right greater than left lateral ventricles, as well as the  third and fourth ventricles again seen. Probable extension to involve the parenchyma of the adjacent right temporal lobe noted. Overall, size and degree of hemorrhage is relatively similar as compared to most recent head CT from earlier today. No underlying mass lesion or abnormal enhancement seen within this region following contrast administration. Associated vasogenic edema throughout the adjacent right temporal occipital region with regional mass effect and approximately 4 mm of right-to-left shift. Right frontal approach EVD in place with tip in the left lateral ventricle. Stable ventricular size and morphology without worsened hydrocephalus. Associated small volume subarachnoid hemorrhage about the basilar cisterns again noted, similar. Few patchy foci of restricted diffusion are seen about the falx (series 2, images 37, 38 as well as within the periventricular and peripheral posterior right cerebral hemisphere (series 2, images 27, 30, 35, 17), consistent with small acute ischemic infarcts, and likely secondary to the edema/shift within the right cerebral hemisphere. No associated hemorrhage or mass effect about these areas of ischemia. Otherwise, gray-white matter differentiation otherwise maintained. No other evidence for prior or chronic intracranial hemorrhage. No other mass lesion. No extra-axial fluid collection. Mild leptomeningeal enhancement about the right cerebral hemisphere and right periventricular region related to underlying edema. No other abnormal enhancement Vascular: Major intracranial vascular flow voids are maintained. Skull and upper cervical spine: Cranial junction within normal limits. Bone marrow signal intensity overall within normal limits. No other scalp soft tissue abnormality. Sinuses/Orbits: Prior bilateral ocular lens replacement. Paranasal sinuses are largely clear. No significant mastoid effusion. Other: None. IMPRESSION: 1. Large intraventricular/periventricular hemorrhage  as above, relatively stable as compared to most recent head CT from earlier today. No underlying mass lesion or abnormal enhancement. 2. Associated vasogenic edema throughout the adjacent right temporoccipital region with regional mass effect and approximately 4 mm of right-to-left shift. 3. Few patchy foci of restricted diffusion about the falx and within the periventricular and peripheral right cerebral hemisphere, consistent with small acute ischemic infarcts. These are likely secondary to the edema/shift within the right cerebral hemisphere. 4. Right frontal approach EVD in place with tip in the left lateral ventricle. Stable ventricular size and morphology without worsened hydrocephalus. 5. Small volume subarachnoid hemorrhage about the basilar cisterns, stable. Electronically Signed   By: Morene Hoard M.D.   On: 04/17/2024 20:18   US  EKG SITE RITE Result Date: 04/17/2024 If Site Rite image not attached, placement could not be confirmed due to current cardiac rhythm.   Assessment/Plan: 69 year old woman with history of DVT on Coumadin  with large IVH - Continue EVD   [name] 04/19/2024, 9:49 AM

## 2024-04-19 NOTE — Progress Notes (Addendum)
 STROKE TEAM PROGRESS NOTE   69 y.o. 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 CT showed R brain IPH w/ IVH and developing hydrocephalus. Warfarin reversed. EVD placed following worsening hydrocephalus.   1/30: CorTrak placed. Cleviprex  weaned off.   INTERVAL HISTORY Family at bedside, updated.  Intubated, on low-dose Precedex . Mental status precludes extubation, not following commands, can reduce sedation as tolerated.  Does not open eyes, does not follow commands, withdraws in right arm, left hemiplegia.  On exam, Localizes on both sides.   Vitals:   04/19/24 1200 04/19/24 1300 04/19/24 1330 04/19/24 1400  BP: (!) 151/76 (!) 166/84 (!) 141/72 130/73  Pulse: 72 85 71 66  Resp: (!) 21 20 19 20   Temp:      TempSrc:      SpO2: 100% 100% 100% 100%  Weight:      Height:       CBC:  Recent Labs  Lab 04/16/24 2207 04/16/24 2343 04/17/24 0542 04/19/24 0427  WBC 9.4  --   --  11.1*  HGB 11.9*   < > 12.2 9.2*  HCT 38.1   < > 36.0 29.5*  MCV 81.6  --   --  81.9  PLT 347  --   --  297   < > = values in this interval not displayed.   Basic Metabolic Panel:  Recent Labs  Lab 04/16/24 2207 04/16/24 2316 04/16/24 2343 04/17/24 0535 04/17/24 0542 04/17/24 0954 04/18/24 1225 04/18/24 1559 04/18/24 2336 04/19/24 0427  NA 142   < > 141   < > 146*   < > 155*   < > 154* 155*  K 4.0  --  3.3*  --  3.6  --   --   --   --  3.0*  CL 102  --  102  --   --   --   --   --   --  120*  CO2 24  --   --   --   --   --   --   --   --  21*  GLUCOSE 186*  --  204*  --   --   --   --   --   --  191*  BUN 15  --  15  --   --   --   --   --   --  16  CREATININE 0.90  --  0.90  --   --   --   --   --   --  0.87  CALCIUM  8.6*  --   --   --   --   --   --   --   --  7.7*  MG  --   --   --    < >  --   --  2.0  --   --  2.1  PHOS  --   --   --   --   --   --  1.6*  --   --  1.4*    < > = values in this interval not displayed.   Lipid Panel:  Recent Labs  Lab 04/19/24 0427  CHOL 169  TRIG 151*  HDL 60  CHOLHDL 2.8  VLDL 30  LDLCALC 79   Urine Drug Screen:  Recent Labs  Lab 04/18/24 2330  LABOPIA NEGATIVE  COCAINSCRNUR NEGATIVE  LABBENZ  POSITIVE*  AMPHETMU NEGATIVE  THCU NEGATIVE  LABBARB NEGATIVE    Alcohol Level  Recent Labs  Lab 04/16/24 2207  ETH <15    IMAGING past 24 hours No results found.   PHYSICAL EXAM General - Well nourished, well developed, intubated on low dose precedex . Ophthalmologic - fundi not visualized due to noncooperation. Cardiovascular - Regular rate and rhythm. Respiratory- CPAP support on ventilator.  Neuro - intubated on low dose precedex , eyes closed, not following commands. With forced eye opening, eyes in R gaze position, not blinking to visual threat, doll's eyes absent, not tracking, pupils 2mm, sluggish to light. Corneal reflex present weakly on the R, gag and cough present. Breathing over the vent, on CPAP with PS.  Facial symmetry not able to test due to ET tube.  Tongue protrusion not cooperative. On pain stimulation, slight withdraw on the right, no significant movement on the left. Sensation, coordination and gait not tested.   ASSESSMENT/PLAN [name] is a 69 y.o. 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 CT showed R brain IPH w/ IVH and developing hydrocephalus. Warfarin reversed. EVD placed following worsening hydrocephalus.    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 Hydrocephalus.  CTA head & neck worsening R lateral IVH. No LVO or stenoses.   CT head 1/29 worsening R IVH w/ ventricular trapping, new basilar SAH w/ trace SAH L brain. 2mm R to L midline shift CT head 1/29 0850 decreasing hydrocephalus, R frontal EVD, 2mm midline shift remains Repeat CT head 1/30 No significant interval change in appearance of bilateral intraventricular hemorrhage, primarily within the right lateral ventricle, with blood again demonstrated within the third and fourth ventricles. Unchanged 5 mm right to left midline shift. Similar degree of hydrocephalus MRI w/wo Large intraventricular/periventricular hemorrhage, relatively stable, approximately 4mm of right-to-left shift. Few patchy foci of restricted diffusion about the falx and within the periventricular and peripheral right cerebral hemisphere, consistent with small acute ischemic infarcts. Right frontal approach EVD in place with tip in the left lateral ventricle. Stable ventricular size and morphology without worsened hydrocephalus. Small volume subarachnoid hemorrhage about the basilar cisterns,stable. 2D Echo 07/2023 EF 60-65%. No source of embolus LDL 79 A1C 6.9 VTE prophylaxis - lovenox   warfarin daily prior to admission, now on No antithrombotic given ICH Therapy recommendations:  pending  Disposition:  pending   Cerebral Edema Acute Hydrocephalus EVD placed 1/29 ([facility]) Limited EVD drainage  3% stopped 1/30 Na 135->146->145->147->154->155-155-154-155 Repeat Na q6h  Positive lupus Anticoagulant Hx B DVT On chronic warfarin, reversed This admission INR 3.1->Kcentra ->1.2 Hx UGIB w/ polyp removal 08/2022;  admission 07/2023 w/ supratherapeutic INR and anemia  Hypoxic Respiratory Failure Intubated 1/29 d/t neuro decline CCM on board On SBT Not candidate for extubation due to mental status Tmax 100.4 Leukocytosis WBC 11.1 Off precedex  ? Decrease or d/c seroquel  150mg   Hypertensive Emergency Home meds:  clonidine  0.1 bid, diltiazem  cd 240, lasix  40 high on arrival, > 180 Now stable  on the high end Now off Cleviprex  gtt IVP PRNS added as well On home clonidine  and cardizem  SBP Mata < 160  Hyperlipidemia Home meds:  crestor  10 Long-term Railsback < 70 Hold statin in setting of  IPH Consider statin resumption at followup  Diabetes type II controlled Home meds:  metformin , ozempic  HgbA1c 6.9, at [place] < 7.0 CBGs SSI Outpt PCP follow up for DM control  Dysphagia Secondary to stroke NPO Speech on board CorTrak in place On TF @ 45  Other Stroke Risk Factors Advanced Age >/= 60  Remote hx cocaine use (quit 2003, reab 2005). UDS positive this admission for benzos and fentanyl  (given inpatient) Morbid Obesity, Body mass index is 44.05 kg/m. On ozempic , BMI >/= 30 associated with increased stroke risk, recommend weight loss, diet and exercise as appropriate  Migraines Obstructive sleep apnea, on CPAP at home Hx DVT on chronic warfarin  Other Active Problems COPD on home O2 Depression Hx Seizure (no sx in 1.5-2y)  Hypokalemia 3.3, repeat in am IBS on Linzess  PTA Spinal stenosis w/p laminectomy Oct 2025 R adrenal adenoma, stable on CT Feb 2016  Hospital day # 3   Pt seen by Neuro NP/APP with MD. Note/plan to be edited by MD as needed.    Colleen JAYSON Likes, DNP [facility] Please use AMION for contact information & EPIC for messaging.  ATTENDING NOTE: I reviewed above note and agree with the assessment and plan. Pt was seen and examined.   Granddaughter is at the bedside, daughter is on the phone. Pt is intubated on low dose precedex , eyes closed, not following commands. With forced eye opening, eyes in R gaze position, not blinking to visual threat, doll's eyes absent, not tracking, pupils 2mm, sluggish to light. Corneal reflex present weakly on the R, gag and cough present. Breathing over the vent, on CPAP with PS.  Facial symmetry not able to test due to ET tube.  Tongue protrusion not cooperative. On pain stimulation, slight withdraw on the right,  no significant movement on the left. Sensation, coordination and gait not tested.   For detailed assessment and plan, please refer to above as I have made changes wherever appropriate.   Ary Cummins, MD PhD Stroke Neurology 04/19/2024 10:24 PM  This patient is critically ill due to ICH, cerebral edema, IVH, hydrocephalus s/p EVD, respiratory failure and at significant risk of neurological worsening, death form brain herniation, brain death, obstructive hydrocephalus, seizure. This patient's care requires constant monitoring of vital signs, hemodynamics, respiratory and cardiac monitoring, review of multiple databases, neurological assessment, discussion with family, other specialists and medical decision making of high complexity. I spent 40 minutes of neurocritical care time in the care of this patient. I had long discussion with granddaughter at bedside and daughter over the phone, updated pt current condition, treatment plan and potential prognosis, and answered all the questions. They expressed understanding and appreciation. I also discussed with CCM Dr. Harold

## 2024-04-19 NOTE — Progress Notes (Addendum)
 "  NAME:  [name], MRN:  [id number], DOB:  1955/06/08, LOS: 3 ADMISSION DATE:  04/16/2024, CONSULTATION DATE:  04/17/24 REFERRING MD:  neuro, CHIEF COMPLAINT:  ich and declining mental status   History of Present Illness:  69 yo female presented with c/o headache n/v. Pt found to have acute R ICH with ventricular hemorrhage and moderate hydrocephalus.  Prior to presentation pt reportedly called her neighbor stating she was not feeling well and that her head hurt. She was complaining of n/v/d with concern she was having food poisoning. History was obtained from chart and family as while pt was following commands she was requiring relatively freq stimulation to arouse.   While initially, pt was able to arouse and follow commands with some L le weakness however during her time here her mentation continued to decline. Repeat scan revealed worsening bleed with new SAH and 2mm midline shift. Neurosx consulted and placed bedside EVD. No improvement in mentation. Now with sonorous respirations and unable to arouse. Habitus and chronic cpap use precludes pt to obstruct airway and decision was made to electively intubate at this time to protect airway 2/2 declining mental status. Family at bedside and updated on critical state, worsening bleed and mental status and need for intubation. Family confirms full code  Pertinent  Medical History  HTN Hyperlipidemia Dm2 with hyperglycemia Migraines Chronic a/c 2/2 + lupus and h/o bl dvt (coumadin ) OSA on cpap L spine stenosis s/p decompression  Significant Hospital Events: Including procedures, antibiotic start and stop dates in addition to other pertinent events   Admitted to neuro ICU 1/28 Evd placement 1/29 Ccm consulted for intubation 1/29 1/30: Intubated, sedated, remain afebrile, off clevidipine  infusion  Interim History / Subjective:  Patient spiked fever with Tmax 100.4, no overnight issues On low-dose Precedex  this morning, which was  stopped Remain unresponsive  Objective    Blood pressure 139/76, pulse 72, temperature 99.9 F (37.7 C), temperature source Axillary, resp. rate 13, height 5' 6 (1.676 m), weight 123.8 kg, SpO2 100%.    Vent Mode: PSV;CPAP FiO2 (%):  [40 %] 40 % Set Rate:  [20 bmp] 20 bmp Vt Set:  [470 mL] 470 mL PEEP:  [5 cmH20] 5 cmH20 Pressure Support:  [5 cmH20] 5 cmH20 Plateau Pressure:  [16 cmH20-20 cmH20] 16 cmH20   Intake/Output Summary (Last 24 hours) at 04/19/2024 1010 Last data filed at 04/19/2024 0800 Gross per 24 hour  Intake 1031.95 ml  Output 1219 ml  Net -187.05 ml   Filed Weights   04/16/24 2038 04/19/24 0500  Weight: 127 kg 123.8 kg       Physical exam: General: Crtitically ill-appearing elderly morbidly obese female, orally intubated HEENT: [place]/AT, eyes anicteric.  ETT and OGT in place Neuro: Eyes closed, does not open, not following commands, withdrawing in right upper extremity, flicker response in right lower extremity, plegic left side.  Pupils unequal left is surgical and larger than right, minimally reactive bilaterally Chest: Coarse breath sounds, no wheezes or rhonchi Heart: Regular rate and rhythm, no murmurs or gallops Abdomen: Soft, nondistended, bowel sounds present  Labs and images reviewed  Patient Lines/Drains/Airways Status     Active Line/Drains/Airways     Name Placement date Placement time Site Days   Peripheral IV 04/16/24 20 G 1 Anterior;Left Forearm 04/16/24  2202  Forearm  3   PICC Triple Lumen 04/17/24 Right Brachial 41 cm 1 cm 04/17/24  1545  -- 2   Urethral Catheter Bard Gasmen, RN 14 Fr.  04/18/24  0015  --  1   ICP/Ventriculostomy Ventricular drainage catheter Right Temporal region 04/17/24  0200  Temporal region  2   Airway 7.5 mm 04/17/24  0445  -- 2   Small Bore Feeding Tube 10 Fr. Left nare Marking at nare/corner of mouth 65 cm 04/18/24  1523  Left nare  1            Resolved problem list    Assessment and Plan  Acute right  periventricular intraparenchymal hemorrhage with IVH in the setting of anticoagulation with warfarin and hypertensive emergency Acute obstructive hydrocephalus status post EVD Cerebral edema with brain compression Hypertensive emergency Acute encephalopathy in the setting of acute right intraparenchymal hemorrhage Induced hypernatremia Patient presented with acute onset headache, nausea with vomiting, diarrhea. Takes Coumadin  with INR 3.1 on arrival, reversed with Kcentra  and Vit K. Patient became difficult to arouse shortly after admission, with R gaze preference, and difficulty moving left side. Repeat CTH showed worsening hemorrhage with subarachnoid hemorrhage and 2mm mass effect. NSGY placed EVD 1/29. 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  Critical care was necessary to treat or prevent  imminent or life-threatening deterioration.  Critical care was time spent personally by me on the following activities: development of treatment plan with patient and/or surrogate as well as nursing, discussions with consultants, evaluation of patient's response to treatment, examination of patient, obtaining history from patient or surrogate, ordering and performing treatments and interventions, ordering and review of laboratory studies, ordering and review of radiographic studies, pulse oximetry, re-evaluation of patient's condition and participation in multidisciplinary rounds.   During this encounter critical care time was devoted to patient care services described in this note for 46 minutes.     Valinda Novas, MD [facility] See Amion for pager If no response to pager, please call [phone] until 7pm After 7pm, Please call E-link [phone]    "

## 2024-04-20 DIAGNOSIS — I615 Nontraumatic intracerebral hemorrhage, intraventricular: Secondary | ICD-10-CM | POA: Diagnosis not present

## 2024-04-20 DIAGNOSIS — J9601 Acute respiratory failure with hypoxia: Secondary | ICD-10-CM | POA: Diagnosis not present

## 2024-04-20 DIAGNOSIS — I610 Nontraumatic intracerebral hemorrhage in hemisphere, subcortical: Secondary | ICD-10-CM | POA: Diagnosis not present

## 2024-04-20 DIAGNOSIS — I609 Nontraumatic subarachnoid hemorrhage, unspecified: Secondary | ICD-10-CM | POA: Diagnosis not present

## 2024-04-20 DIAGNOSIS — G919 Hydrocephalus, unspecified: Secondary | ICD-10-CM | POA: Diagnosis not present

## 2024-04-20 DIAGNOSIS — I161 Hypertensive emergency: Secondary | ICD-10-CM | POA: Diagnosis not present

## 2024-04-20 LAB — GLUCOSE, CAPILLARY
Glucose-Capillary: 160 mg/dL — ABNORMAL HIGH (ref 70–99)
Glucose-Capillary: 195 mg/dL — ABNORMAL HIGH (ref 70–99)
Glucose-Capillary: 156 mg/dL — ABNORMAL HIGH (ref 70–99)
Glucose-Capillary: 153 mg/dL — ABNORMAL HIGH (ref 70–99)
Glucose-Capillary: 175 mg/dL — ABNORMAL HIGH (ref 70–99)
Glucose-Capillary: 167 mg/dL — ABNORMAL HIGH (ref 70–99)

## 2024-04-20 LAB — CBC
HCT: 31.8 % — ABNORMAL LOW (ref 36.0–46.0)
Hemoglobin: 9.8 g/dL — ABNORMAL LOW (ref 12.0–15.0)
MCH: 25.3 pg — ABNORMAL LOW (ref 26.0–34.0)
MCHC: 30.8 g/dL (ref 30.0–36.0)
MCV: 82 fL (ref 80.0–100.0)
Platelets: 316 10*3/uL (ref 150–400)
RBC: 3.88 MIL/uL (ref 3.87–5.11)
RDW: 18.6 % — ABNORMAL HIGH (ref 11.5–15.5)
WBC: 9.2 10*3/uL (ref 4.0–10.5)
nRBC: 0.3 % — ABNORMAL HIGH (ref 0.0–0.2)

## 2024-04-20 LAB — SODIUM
Sodium: 159 mmol/L — ABNORMAL HIGH (ref 135–145)
Sodium: 152 mmol/L — ABNORMAL HIGH (ref 135–145)
Sodium: 153 mmol/L — ABNORMAL HIGH (ref 135–145)

## 2024-04-20 LAB — BASIC METABOLIC PANEL WITH GFR
Anion gap: 11 (ref 5–15)
BUN: 15 mg/dL (ref 8–23)
CO2: 24 mmol/L (ref 22–32)
Calcium: 9.1 mg/dL (ref 8.9–10.3)
Chloride: 117 mmol/L — ABNORMAL HIGH (ref 98–111)
Creatinine, Ser: 0.75 mg/dL (ref 0.44–1.00)
GFR, Estimated: >60 mL/min
Glucose, Bld: 169 mg/dL — ABNORMAL HIGH (ref 70–99)
Potassium: 3.3 mmol/L — ABNORMAL LOW (ref 3.5–5.1)
Sodium: 152 mmol/L — ABNORMAL HIGH (ref 135–145)

## 2024-04-20 LAB — PHOSPHORUS: Phosphorus: 2.4 mg/dL — ABNORMAL LOW (ref 2.5–4.6)

## 2024-04-20 LAB — MAGNESIUM: Magnesium: 2 mg/dL (ref 1.7–2.4)

## 2024-04-20 MED ORDER — FREE WATER: 200.0000 mL | Status: DC

## 2024-04-20 MED ORDER — AMLODIPINE BESYLATE 10 MG PO TABS
10.0000 mg | ORAL_TABLET | Freq: Every day | ORAL | Status: AC
  Administered 2024-04-20 – 2024-04-25 (×6): 10 mg
  Filled 2024-04-20 (×6): qty 1

## 2024-04-20 MED ORDER — POTASSIUM CHLORIDE 20 MEQ PO PACK
40.0000 meq | PACK | ORAL | Status: DC
  Administered 2024-04-20: 40 meq

## 2024-04-20 MED ORDER — CLEVIDIPINE BUTYRATE 0.5 MG/ML IV EMUL
0.0000 mg/h | INTRAVENOUS | Status: DC
  Administered 2024-04-20 (×2): 2 mg/h via INTRAVENOUS
  Administered 2024-04-20: 8 mg/h via INTRAVENOUS
  Administered 2024-04-20: 2 mg/h via INTRAVENOUS
  Administered 2024-04-21: 6 mg/h via INTRAVENOUS
  Administered 2024-04-21: 2 mg/h via INTRAVENOUS
  Administered 2024-04-21: 6 mg/h via INTRAVENOUS
  Administered 2024-04-21: 8 mg/h via INTRAVENOUS
  Administered 2024-04-21: 10 mg/h via INTRAVENOUS
  Administered 2024-04-21: 14 mg/h via INTRAVENOUS
  Administered 2024-04-21: 6 mg/h via INTRAVENOUS
  Administered 2024-04-21: 14 mg/h via INTRAVENOUS
  Administered 2024-04-21: 10 mg/h via INTRAVENOUS
  Administered 2024-04-22: 6 mg/h via INTRAVENOUS
  Administered 2024-04-22: 2 mg/h via INTRAVENOUS
  Filled 2024-04-20 (×3): qty 50
  Filled 2024-04-20: qty 100
  Filled 2024-04-20 (×9): qty 50

## 2024-04-20 MED ORDER — SODIUM CHLORIDE 0.9 % IV SOLN: INTRAVENOUS | Status: DC

## 2024-04-20 MED ORDER — CLONIDINE HCL 0.2 MG PO TABS
0.3000 mg | ORAL_TABLET | Freq: Three times a day (TID) | ORAL | Status: AC
  Administered 2024-04-20 – 2024-04-25 (×15): 0.3 mg
  Filled 2024-04-20 (×17): qty 1

## 2024-04-20 MED ORDER — CLONIDINE HCL 0.2 MG PO TABS
0.2000 mg | ORAL_TABLET | Freq: Three times a day (TID) | ORAL | Status: DC
  Administered 2024-04-20: 0.2 mg
  Filled 2024-04-20: qty 1

## 2024-04-20 MED ORDER — SODIUM PHOSPHATES 45 MMOLE/15ML IV SOLN
45.0000 mmol | Freq: Once | INTRAVENOUS | Status: AC
  Administered 2024-04-20: 45 mmol via INTRAVENOUS
  Filled 2024-04-20: qty 15

## 2024-04-20 MED ORDER — POTASSIUM CHLORIDE 20 MEQ PO PACK
40.0000 meq | PACK | ORAL | Status: AC
  Administered 2024-04-20 (×3): 40 meq
  Filled 2024-04-20 (×4): qty 2

## 2024-04-20 MED ORDER — LOSARTAN POTASSIUM 50 MG PO TABS
100.0000 mg | ORAL_TABLET | Freq: Every day | ORAL | Status: AC
  Administered 2024-04-20 – 2024-04-25 (×6): 100 mg
  Filled 2024-04-20 (×6): qty 2

## 2024-04-20 NOTE — Progress Notes (Addendum)
 STROKE TEAM PROGRESS NOTE   69 y.o. 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 CT showed R brain IPH w/ IVH and developing hydrocephalus. Warfarin reversed. EVD placed following worsening hydrocephalus.   1/30: CorTrak placed. Cleviprex  weaned off.  1/31: Sedation off.   INTERVAL HISTORY Family at bedside, updated.  Intubated, on low-dose Precedex . Cleviprex  back on for BP control, Spira < 160. Discontinued home seroquel .   Mental status precludes extubation, not following commands, she has been off sedation since yesterday AM  Does not open eyes, does not follow commands, localizes in right arm, slight withdraw BLE   Vitals:   04/20/24 1145 04/20/24 1200 04/20/24 1215 04/20/24 1230  BP: (!) 156/91 (!) 149/88 (!) 149/89 (!) 148/94  Pulse: 93 79 78 77  Resp: 19 18 17 17   Temp:  (!) 100.6 F (38.1 C)    TempSrc:  Axillary    SpO2: 100% 99% 99% 100%  Weight:      Height:       CBC:  Recent Labs  Lab 04/19/24 0427 04/20/24 0509  WBC 11.1* 9.2  HGB 9.2* 9.8*  HCT 29.5* 31.8*  MCV 81.9 82.0  PLT 297 316   Basic Metabolic Panel:  Recent Labs  Lab 04/19/24 0427 04/19/24 2013 04/19/24 2014 04/20/24 0509 04/20/24 1148  NA 155*   < >  --  152* 159*  K 3.0*  --   --  3.3*  --   CL 120*  --   --  117*  --   CO2 21*  --   --  24  --   GLUCOSE 191*  --   --  169*  --   BUN 16  --   --  15  --   CREATININE 0.87  --   --  0.75  --   CALCIUM  7.7*  --   --  9.1  --   MG 2.1  --   --  2.0  --   PHOS 1.4*  --  2.2* 2.4*  --    < > = values in this interval not displayed.   Lipid Panel:  Recent Labs  Lab 04/19/24 0427  CHOL 169  TRIG 151*  HDL 60  CHOLHDL 2.8  VLDL 30  LDLCALC 79   Urine Drug Screen:  Recent Labs  Lab 04/18/24 2330  LABOPIA NEGATIVE  COCAINSCRNUR NEGATIVE  LABBENZ POSITIVE*  AMPHETMU NEGATIVE  THCU NEGATIVE  LABBARB NEGATIVE     Alcohol Level  Recent Labs  Lab 04/16/24 2207  ETH <15    IMAGING past 24 hours No results found.   PHYSICAL EXAM General - Well nourished, well developed, intubated on no sedation.  Ophthalmologic - fundi not visualized due to noncooperation. Cardiovascular - Regular rate and rhythm. Respiratory- CPAP support on ventilator.  Neuro - intubated with no sedation, eyes closed, not following commands. With forced eye opening, eyes in R gaze position, not blinking to visual threat, doll's eyes absent, not tracking, pupils 2mm, sluggish to light.  Corneal reflex present weakly on the L, gag and cough present.  Breathing over the vent, on CPAP with PS.   Facial symmetry not able to test due to ET tube.   Tongue protrusion not cooperative.   On pain stimulation, localizes RUE, slight withdraw BLE , no significant movement on left arm.  Sensation, coordination and gait not tested.   ASSESSMENT/PLAN [name] is a 69 y.o. 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 CT showed R brain IPH w/ IVH and developing hydrocephalus. Warfarin reversed. EVD placed following worsening hydrocephalus.    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 Hydrocephalus.  CTA head & neck worsening R lateral IVH. No LVO or stenoses.  CT head 1/29 worsening R IVH w/ ventricular trapping, new basilar SAH w/ trace SAH L brain. 2mm R to L midline shift CT head 1/29 0850 decreasing hydrocephalus, R frontal EVD, 2mm midline shift remains Repeat CT head 1/30 No significant interval change in appearance of bilateral intraventricular hemorrhage, primarily within the right  lateral ventricle, with blood again demonstrated within the third and fourth ventricles. Unchanged 5 mm right to left midline shift. Similar degree of hydrocephalus MRI w/wo Large intraventricular/periventricular hemorrhage, relatively stable, approximately 4mm of right-to-left shift. Few patchy foci of restricted diffusion about the falx and within the periventricular and peripheral right cerebral hemisphere, consistent with small acute ischemic infarcts. Right frontal approach EVD in place with tip in the left lateral ventricle. Stable ventricular size and morphology without worsened hydrocephalus. Small volume subarachnoid hemorrhage about the basilar cisterns,stable. 2D Echo 07/2023 EF 60-65%. No source of embolus LDL 79 A1C 6.9 VTE prophylaxis - lovenox   warfarin daily prior to admission, now on No antithrombotic given ICH Therapy recommendations:  pending  Disposition:  pending   Cerebral Edema Acute Hydrocephalus EVD placed 1/29 (CN[facility]) Limited EVD drainage has improved today, now sanguineous 3% stopped 1/30-> NS @ 40 Na 135->146->145->147->154->155-155-154-155-153-152-159--152 Repeat Na q6h  Positive lupus Anticoagulant Hx DVT On chronic warfarin, reversed This admission INR 3.1->Kcentra ->1.2 Hx UGIB w/ polyp removal 08/2022;  admission 07/2023 w/ supratherapeutic INR and anemia  Hypoxic Respiratory Failure Intubated 1/29 d/t neuro decline CCM on board On SBT Not candidate for extubation due to mental status Tmax 100.4->100.6 Leukocytosis WBC 11.1--9.2 Off precedex  D/c seroquel  150mg  to see if improves mental status  Hypertensive Emergency Home meds:  clonidine  0.1 bid, diltiazem  cd 240, lasix  40 high on arrival, > 180 Cleviprex  gtt back on, tape off as able IVP PRNS on board On clonidine  0.3 tid and cozaar  100 SBP Salo < 160  Hyperlipidemia Home meds:  crestor  10mg  LDL 79, Huhn < 70 Hold statin in setting of IPH Consider statin resumption at followup  Diabetes  type II controlled Home meds:  metformin , ozempic  HgbA1c 6.9, at [place] < 7.0 CBGs SSI Outpt PCP follow up for DM control  Dysphagia Secondary to stroke NPO Speech on board CorTrak in place On TF @ 45 and IVF @ 40  Other Stroke Risk Factors Advanced Age >/= 64  Remote hx cocaine use (quit 2003, reab 2005). UDS positive this admission for benzos and fentanyl  (given inpatient) Morbid Obesity, Body mass index is 44.83 kg/m. 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 Note/plan to be edited by MD as needed.    Rocky JAYSON Likes, DNP [facility] Please use AMION for contact information & EPIC for messaging.   ATTENDING NOTE: I reviewed above note and agree with the assessment and plan. Pt was seen and examined.   Granddaughter and daughter are at bedside.  Patient sister on the phone.  Patient off sedation, off Precedex , on CPAP with pressure support.  Still not open eyes on voice, however, able to localizing pain on the right upper extremity, slight withdrawal bilateral lower extremities.  Seems slightly more responsive than yesterday but otherwise unchanged.  Still has corneal reflexes and gag and cough, pupil equal size sluggish to light, not blinking to visual threat bilaterally.  BP still on the high end, put back on Cleviprex , increase clonidine  and add losartan  to wean off Cleviprex  as able.  Not a candidate for extubation today.  Sodium 152, put on IV fluid.  Continue tube feeding.  Discussed with family regarding patient current situation and  potential cerebral edema, family would like to give more time for outcome.  Discussed with CCM Dr. Harold.  For detailed assessment and plan, please refer to above as I have made changes wherever appropriate.   Colleen Cummins, MD PhD Stroke Neurology 04/20/2024 6:21 PM   This patient is critically ill due to ICH, cerebral edema, IVH, hydrocephalus s/p EVD, respiratory failure and at significant risk of neurological worsening, death form brain herniation, brain death, obstructive hydrocephalus, seizure. This patient's care requires constant monitoring of vital signs, hemodynamics, respiratory and cardiac monitoring, review of multiple databases, neurological assessment, discussion with family, other specialists and medical decision making of high complexity. I spent 40 minutes of neurocritical care time in the care of this patient. I had long discussion with granddaughter and daughter at bedside and sister over the phone, updated pt current condition, treatment plan and potential prognosis, and answered all the questions. They expressed understanding and appreciation.

## 2024-04-20 NOTE — Progress Notes (Signed)
 04/20/2024 SBP above [place] despite PRNs and multiple orals  Restart cleviprex ; increase clonidine   Rolan Sharps MD E-Link

## 2024-04-20 NOTE — Progress Notes (Signed)
 Subjective: NAEs  Objective: Vital signs in last 24 hours: Temp:  [98.9 F (37.2 C)-100.7 F (38.2 C)] 99.7 F (37.6 C) (02/01 0800) Pulse Rate:  [63-107] 82 (02/01 1000) Resp:  [0-38] 18 (02/01 1000) BP: (118-190)/(61-107) 136/75 (02/01 1000) SpO2:  [98 %-100 %] 100 % (02/01 1000) FiO2 (%):  [30 %-40 %] 30 % (02/01 0801) Weight:  [873 kg] 126 kg (02/01 0500)  Intake/Output from previous day: 01/31 0701 - 02/01 0700 In: 2420.3 [I.V.:242.1; NG/GT:1613; IV Piggyback:565.1] Out: 2634 [Urine:2600; Drains:34] Intake/Output this shift: Total I/O In: 170.6 [I.V.:34; NG/GT:135; IV Piggyback:1.7] Out: 12 [Drains:12]  Intubated, sedated Eyes closed, pupils 3 mm sluggishly reactive Withdraws on right side to pain EVD in place, patent, bloody drainage.  Drained 25 mL at 10 cm  Lab Results: Recent Labs    04/19/24 0427 04/20/24 0509  WBC 11.1* 9.2  HGB 9.2* 9.8*  HCT 29.5* 31.8*  PLT 297 316   BMET Recent Labs    04/19/24 0427 04/19/24 2013 04/20/24 0509  NA 155* 153* 152*  K 3.0*  --  3.3*  CL 120*  --  117*  CO2 21*  --  24  GLUCOSE 191*  --  169*  BUN 16  --  15  CREATININE 0.87  --  0.75  CALCIUM  7.7*  --  9.1    Studies/Results: No results found.  Assessment/Plan: 69 year old woman with history of DVT on Coumadin  with large IVH - Continue EVD - I had bedside discussion with family regarding likelihood of long arduous recovery, possible tracheostomy, gastrostomy.  Continue supportive care.    Dorn KANDICE Ned 04/20/2024, 10:21 AM

## 2024-04-20 NOTE — Plan of Care (Signed)
" °  Problem: Coping: Kwiecinski: Will identify appropriate support needs Outcome: Progressing   Problem: Health Behavior/Discharge Planning: Eidem: Goals will be collaboratively established with patient/family Outcome: Progressing   Problem: Clinical Measurements: Washer: Cardiovascular complication will be avoided Outcome: Progressing   Problem: Nutrition: Yeske: Adequate nutrition will be maintained Outcome: Progressing   Problem: Pain Managment: Mcglone: General experience of comfort will improve and/or be controlled Outcome: Progressing   Problem: Clinical Measurements: Haddox: Ability to maintain clinical measurements within normal limits will improve Outcome: Not Progressing   Problem: Elimination: Flatt: Will not experience complications related to bowel motility Outcome: Not Progressing   "

## 2024-04-20 NOTE — Progress Notes (Signed)
 VENTILATOR WEAN NOTE 04/20/2024  Start Mode: PRVC  Wean Mode: Pressure Support  Duration before failure:   Reason for failure: N/A  Notes: Patient placed on vent wean PS/CPAP 5/5. Patient tolerating well at this time. Family at bedside.

## 2024-04-20 NOTE — Progress Notes (Signed)
 "  NAME:  [name], MRN:  [id number], DOB:  [birth year], LOS: 4 ADMISSION DATE:  04/16/2024, CONSULTATION DATE:  04/17/24 REFERRING MD:  neuro, CHIEF COMPLAINT:  ich and declining mental status   History of Present Illness:  69 yo female presented with c/o headache n/v. Pt found to have acute R ICH with ventricular hemorrhage and moderate hydrocephalus.  Prior to presentation pt reportedly called her neighbor stating she was not feeling well and that her head hurt. She was complaining of n/v/d with concern she was having food poisoning. History was obtained from chart and family as while pt was following commands she was requiring relatively freq stimulation to arouse.   While initially, pt was able to arouse and follow commands with some L le weakness however during her time here her mentation continued to decline. Repeat scan revealed worsening bleed with new SAH and 2mm midline shift. Neurosx consulted and placed bedside EVD. No improvement in mentation. Now with sonorous respirations and unable to arouse. Habitus and chronic cpap use precludes pt to obstruct airway and decision was made to electively intubate at this time to protect airway 2/2 declining mental status. Family at bedside and updated on critical state, worsening bleed and mental status and need for intubation. Family confirms full code  Pertinent  Medical History  HTN Hyperlipidemia Dm2 with hyperglycemia Migraines Chronic a/c 2/2 + lupus and h/o bl dvt (coumadin ) OSA on cpap L spine stenosis s/p decompression  Significant Hospital Events: Including procedures, antibiotic start and stop dates in addition to other pertinent events   Admitted to neuro ICU 1/28 Evd placement 1/29 Ccm consulted for intubation 1/29 1/30: Intubated, sedated, remain afebrile, off clevidipine  infusion 1/31 patient spiked fever with Tmax 100.4, remained on low-dose Precedex .  Still remained unresponsive  Interim History / Subjective:   Patient became hypertensive overnight, restarted back on clevidipine  infusion Remain off sedation, still not waking up  Objective    Blood pressure (!) 143/72, pulse 76, temperature 99.7 F (37.6 C), temperature source Axillary, resp. rate 18, height 5' 6 (1.676 m), weight 126 kg, SpO2 100%.    Vent Mode: PRVC FiO2 (%):  [30 %-40 %] 30 % Set Rate:  [20 bmp] 20 bmp Vt Set:  [470 mL] 470 mL PEEP:  [5 cmH20] 5 cmH20 Pressure Support:  [5 cmH20] 5 cmH20 Plateau Pressure:  [15 cmH20] 15 cmH20   Intake/Output Summary (Last 24 hours) at 04/20/2024 9076 Last data filed at 04/20/2024 0900 Gross per 24 hour  Intake 1785.07 ml  Output 2635 ml  Net -849.93 ml   Filed Weights   04/16/24 2038 04/19/24 0500 04/20/24 0500  Weight: 127 kg 123.8 kg 126 kg         Physical exam: General: Crtitically ill-appearing morbidly obese elderly female, orally intubated HEENT: [place]/AT, eyes anicteric.  ETT and cortrak in place Neuro: Closed, does not open, not following commands, pupils equal, minimally reactive, positive corneal, gag and cough reflexes.  Localizing in right upper extremity, withdrawing in bilateral lower extremities, plegic left upper extremity Chest: Coarse breath sounds, no wheezes or rhonchi Heart: Regular rate and rhythm, no murmurs or gallops Abdomen: Soft, nondistended, bowel sounds present  Labs reviewed  Patient Lines/Drains/Airways Status     Active Line/Drains/Airways     Name Placement date Placement time Site Days   Peripheral IV 04/16/24 20 G 1 Anterior;Left Forearm 04/16/24  2202  Forearm  4   PICC Triple Lumen 04/17/24 Right Brachial 41 cm 1 cm 04/17/24  1545  -- 3   Urethral Catheter Bard Gasmen, RN 14 Fr. 04/18/24  0015  --  2   ICP/Ventriculostomy Ventricular drainage catheter Right Temporal region 04/17/24  0200  Temporal region  3   Airway 7.5 mm 04/17/24  0445  -- 3   Small Bore Feeding Tube 10 Fr. Left nare Marking at nare/corner of mouth 65 cm 04/18/24  1523   Left nare  2            Resolved problem list    Assessment and Plan  Acute right periventricular intraparenchymal hemorrhage with IVH in the setting of anticoagulation with warfarin and hypertensive emergency Acute obstructive hydrocephalus status post EVD Cerebral edema with brain compression Hypertensive emergency Acute encephalopathy in the setting of acute right intraparenchymal hemorrhage Induced hypernatremia Patient presented with acute onset headache, nausea with vomiting, diarrhea. Takes Coumadin  with INR 3.1 on arrival, reversed with Kcentra  and Vit K. Patient became difficult to arouse shortly after admission, with R gaze preference, and difficulty moving left side. Repeat CTH showed worsening hemorrhage with subarachnoid hemorrhage and 2mm mass effect. NSGY placed EVD 1/29. Continue neuro watch Neuroexam has not changed Stroke team is following EVD is at 10 cm water  draining minimal CSF Neurosurgery is following Keep head of bed elevated Off hypertonic saline, serum sodium is at [place], currently at 152 Monitor serum sodium every 6 hours She became hypotensive overnight, back on clevidipine  infusion Continue amlodipine  10 mg daily, started on losartan  100 mg daily, increase clonidine  to 0.3 mg 3 times daily Titrate clevidipine  with SBP Hauser 130-150  Acute hypoxic respiratory failure  Aspiration PNA Continue lung protective ventilation VAP prevention bundle in place Off sedation with RASS Svehla 0/-1 Remains encephalopathic Continue IV ceftriaxone  Cultures have been negative so far She is afebrile now  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  status post reversal Outpatient follow-up with hematology positive lupus anticoagulant, beta-2 -glycoprotein IgA antibody  Outpatient follow-up with rheumatology Hold all anticoagulation and antiplatelet agents  Type II Diabetes  A1c 6.9 blood sugars are controlled  Continue sliding scale insulin  with CBG [place] 140-180  OSA CPAP  nightly once extubated  Morbid obesity Diet and exercise counseling when appropriate  Hypokalemia/hypophosphatemia Continue to replace electrolytes Repeat in the morning    The patient is critically ill due to acute right periventricular intraparenchymal hemorrhage with IVH/cerebral edema with brain compression.  Critical care was necessary to treat or prevent imminent or life-threatening deterioration.  Critical care was time spent personally by me on the following activities: development of treatment plan with patient and/or surrogate as well as nursing, discussions with consultants, evaluation of patient's response to treatment, examination of patient, obtaining history from patient or surrogate, ordering and performing treatments and interventions, ordering and review of laboratory studies, ordering and review of radiographic studies, pulse oximetry, re-evaluation of patient's condition and participation in multidisciplinary rounds.   During this encounter critical care time was devoted to patient care services described in this note for 39 minutes.     Valinda Novas, MD [facility] See Amion for pager If no response to pager, please call [phone] until 7pm After 7pm, Please call E-link [phone]    "

## 2024-04-21 DIAGNOSIS — G9340 Encephalopathy, unspecified: Secondary | ICD-10-CM | POA: Diagnosis not present

## 2024-04-21 DIAGNOSIS — E870 Hyperosmolality and hypernatremia: Secondary | ICD-10-CM

## 2024-04-21 DIAGNOSIS — I615 Nontraumatic intracerebral hemorrhage, intraventricular: Secondary | ICD-10-CM | POA: Diagnosis not present

## 2024-04-21 DIAGNOSIS — I610 Nontraumatic intracerebral hemorrhage in hemisphere, subcortical: Secondary | ICD-10-CM | POA: Diagnosis not present

## 2024-04-21 DIAGNOSIS — E876 Hypokalemia: Secondary | ICD-10-CM | POA: Diagnosis not present

## 2024-04-21 DIAGNOSIS — E119 Type 2 diabetes mellitus without complications: Secondary | ICD-10-CM | POA: Diagnosis not present

## 2024-04-21 DIAGNOSIS — J9601 Acute respiratory failure with hypoxia: Secondary | ICD-10-CM | POA: Diagnosis not present

## 2024-04-21 DIAGNOSIS — I161 Hypertensive emergency: Secondary | ICD-10-CM | POA: Diagnosis not present

## 2024-04-21 DIAGNOSIS — I609 Nontraumatic subarachnoid hemorrhage, unspecified: Secondary | ICD-10-CM | POA: Diagnosis not present

## 2024-04-21 DIAGNOSIS — G911 Obstructive hydrocephalus: Secondary | ICD-10-CM | POA: Diagnosis not present

## 2024-04-21 DIAGNOSIS — J690 Pneumonitis due to inhalation of food and vomit: Secondary | ICD-10-CM | POA: Diagnosis not present

## 2024-04-21 DIAGNOSIS — G919 Hydrocephalus, unspecified: Secondary | ICD-10-CM | POA: Diagnosis not present

## 2024-04-21 LAB — CBC
HCT: 33.5 % — ABNORMAL LOW (ref 36.0–46.0)
Hemoglobin: 10.5 g/dL — ABNORMAL LOW (ref 12.0–15.0)
MCH: 25.5 pg — ABNORMAL LOW (ref 26.0–34.0)
MCHC: 31.3 g/dL (ref 30.0–36.0)
MCV: 81.3 fL (ref 80.0–100.0)
Platelets: 337 10*3/uL (ref 150–400)
RBC: 4.12 MIL/uL (ref 3.87–5.11)
RDW: 18.2 % — ABNORMAL HIGH (ref 11.5–15.5)
WBC: 9.3 10*3/uL (ref 4.0–10.5)
nRBC: 0.3 % — ABNORMAL HIGH (ref 0.0–0.2)

## 2024-04-21 LAB — GLUCOSE, CAPILLARY
Glucose-Capillary: 168 mg/dL — ABNORMAL HIGH (ref 70–99)
Glucose-Capillary: 171 mg/dL — ABNORMAL HIGH (ref 70–99)
Glucose-Capillary: 172 mg/dL — ABNORMAL HIGH (ref 70–99)
Glucose-Capillary: 175 mg/dL — ABNORMAL HIGH (ref 70–99)
Glucose-Capillary: 188 mg/dL — ABNORMAL HIGH (ref 70–99)
Glucose-Capillary: 200 mg/dL — ABNORMAL HIGH (ref 70–99)

## 2024-04-21 LAB — BASIC METABOLIC PANEL WITH GFR
Anion gap: 13 (ref 5–15)
BUN: 18 mg/dL (ref 8–23)
CO2: 22 mmol/L (ref 22–32)
Calcium: 9.2 mg/dL (ref 8.9–10.3)
Chloride: 115 mmol/L — ABNORMAL HIGH (ref 98–111)
Creatinine, Ser: 0.8 mg/dL (ref 0.44–1.00)
GFR, Estimated: >60 mL/min
Glucose, Bld: 211 mg/dL — ABNORMAL HIGH (ref 70–99)
Potassium: 3.8 mmol/L (ref 3.5–5.1)
Sodium: 151 mmol/L — ABNORMAL HIGH (ref 135–145)

## 2024-04-21 LAB — SODIUM
Sodium: 151 mmol/L — ABNORMAL HIGH (ref 135–145)
Sodium: 150 mmol/L — ABNORMAL HIGH (ref 135–145)

## 2024-04-21 LAB — CULTURE, RESPIRATORY W GRAM STAIN: Culture: NO GROWTH

## 2024-04-21 LAB — PHOSPHORUS: Phosphorus: 2.9 mg/dL (ref 2.5–4.6)

## 2024-04-21 LAB — MAGNESIUM: Magnesium: 1.9 mg/dL (ref 1.7–2.4)

## 2024-04-21 LAB — TRIGLYCERIDES: Triglycerides: 233 mg/dL — ABNORMAL HIGH

## 2024-04-21 MED ORDER — SODIUM CHLORIDE 3 % IV SOLN
INTRAVENOUS | Status: DC
  Filled 2024-04-21 (×2): qty 500

## 2024-04-21 MED ORDER — CEFAZOLIN SODIUM-DEXTROSE 2-4 GM/100ML-% IV SOLN: 2.0000 g | Freq: Three times a day (TID) | INTRAVENOUS | Status: DC

## 2024-04-21 MED ORDER — SODIUM CHLORIDE 23.4 % INJECTION (4 MEQ/ML) FOR IV ADMINISTRATION
120.0000 meq | Freq: Once | INTRAVENOUS | Status: AC
  Administered 2024-04-21: 120 meq via INTRAVENOUS
  Filled 2024-04-21: qty 30

## 2024-04-21 MED ORDER — SENNA 8.6 MG PO TABS: 2.0000 | ORAL_TABLET | Freq: Two times a day (BID) | ORAL | Status: DC

## 2024-04-21 MED ORDER — CARVEDILOL 12.5 MG PO TABS
12.5000 mg | ORAL_TABLET | Freq: Two times a day (BID) | ORAL | Status: DC
  Administered 2024-04-21 – 2024-04-22 (×3): 12.5 mg
  Filled 2024-04-21 (×3): qty 1

## 2024-04-21 MED ORDER — POLYETHYLENE GLYCOL 3350 17 G PO PACK: 17.0000 g | PACK | Freq: Two times a day (BID) | ORAL | Status: DC

## 2024-04-21 MED ORDER — LACTULOSE 10 GM/15ML PO SOLN
20.0000 g | ORAL | Status: AC
  Administered 2024-04-21 (×4): 20 g
  Filled 2024-04-21 (×4): qty 30

## 2024-04-21 MED ORDER — POLYETHYLENE GLYCOL 3350 17 G PO PACK
17.0000 g | PACK | Freq: Every day | ORAL | Status: DC
  Administered 2024-04-21 – 2024-04-23 (×2): 17 g
  Filled 2024-04-21: qty 1

## 2024-04-21 MED ORDER — SENNA 8.6 MG PO TABS
1.0000 | ORAL_TABLET | Freq: Two times a day (BID) | ORAL | Status: DC
  Administered 2024-04-21 – 2024-04-25 (×6): 8.6 mg
  Filled 2024-04-21 (×6): qty 1

## 2024-04-21 MED ORDER — LINEZOLID 600 MG PO TABS
600.0000 mg | ORAL_TABLET | Freq: Two times a day (BID) | ORAL | Status: AC
  Administered 2024-04-21 – 2024-04-25 (×10): 600 mg
  Filled 2024-04-21 (×10): qty 1

## 2024-04-21 MED ORDER — POTASSIUM CHLORIDE 20 MEQ PO PACK
40.0000 meq | PACK | Freq: Once | ORAL | Status: AC
  Administered 2024-04-21: 40 meq
  Filled 2024-04-21: qty 2

## 2024-04-21 NOTE — Progress Notes (Addendum)
 Nutrition Follow-up  DOCUMENTATION CODES:   Morbid obesity  INTERVENTION:   Continue tube feeding via Cortrak tube: Osmolite 1.5 at 45 ml/h (1080 ml per day)  Prosource TF20 60 ml daily  Provides 1700 kcal, 87 gm protein, 822 ml free water  daily    NUTRITION DIAGNOSIS:   Inadequate oral intake related to inability to eat as evidenced by NPO status. Ongoing   Collier:   Patient will meet greater than or equal to 90% of their needs Met with TF at [place] rate   MONITOR:   Vent status  REASON FOR ASSESSMENT:   Ventilator    ASSESSMENT:   Pt with hx of HLD, HTN, diabetes, OSA on CPAP, COPD, and lupus. Admitted w/ c/o of headache and n/v, found to have R ICH but mentation worsened and repeat scan showed worsening bleed with new SAH and 2 mm midline shift.  Pt discussed during ICU rounds and with RN and MD.  Family at bedside during visit.  Pt has now been off sedation x 48 hours, plan for GOC meeting with family.   Pt on intermittent cleviprex    1/29 - admitted s/p EVD placement and intubation  1/30 - s/p cortrak placement; tip gastric   EVD 127 ml   Medications reviewed and include: SSI every 4 hours, protonix , miralax , senna, thiamine  (end 2/6) Lactulose  20 g every 2 hours until BM Cleviprex  @ 20 ml/hr provides 960 kcal   Labs reviewed:  Na 151  Refeeding Labs: Recent Labs  Lab 04/19/24 0427 04/19/24 2014 04/20/24 0509 04/21/24 0457  K 3.0*  --  3.3* 3.8  MG 2.1  --  2.0 1.9  PHOS 1.4* 2.2* 2.4* 2.9     Diet Order:   Diet Order             Diet NPO time specified  Diet effective now                   EDUCATION NEEDS:   No education needs have been identified at this time  Skin:  Skin Assessment: Reviewed RN Assessment  Last BM:  unknown  Height:   Ht Readings from Last 1 Encounters:  04/19/24 5' 6 (1.676 m)    Weight:   Wt Readings from Last 1 Encounters:  04/21/24 126.1 kg    Ideal Body Weight:  59.1 kg  BMI:  Body mass  index is 44.87 kg/m.  Estimated Nutritional Needs:   Kcal:  1700-1900  Protein:  90-110g  Fluid:  1.7-1.9L  Josiane Labine P., RD, LDN, CNSC See AMiON for contact information

## 2024-04-21 NOTE — Progress Notes (Signed)
 Patient ID: [name], female   DOB: 1955/03/24, 69 y.o.   MRN: [id number] BP (!) 155/90   Pulse 100   Temp (!) 102.5 F (39.2 C) (Oral)   Resp (!) 29   Ht 5' 6 (1.676 m)   Wt 126.1 kg   SpO2 99%   BMI 44.87 kg/m  Intubated, not following commands. Does not open eyes Purposeful with right upper extremity, no movement on left Vigorous cough Weaning smoothly Ventricular catheter draining well No real neurological change since last week Continue supportive care, waiting on improvement.

## 2024-04-21 NOTE — Progress Notes (Signed)
 PT Cancellation Note  Patient Details Name: Basil Blakesley Moch MRN: [id number] DOB: 03/14/1956   Cancelled Treatment:    Reason Eval/Treat Not Completed: Patient not medically ready (pt with EVD, vent, not sedated but not opening eyes. Not appropriate for therapy at this time. will sign off and await new order as pt appropriate.)   Leily Capek B Zoie Sarin 04/21/2024, 6:38 AM Lenoard SQUIBB, PT Acute Rehabilitation Services Office: [phone]

## 2024-04-21 NOTE — Progress Notes (Signed)
 STROKE TEAM PROGRESS NOTE   69 y.o. 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 CT showed R brain IPH w/ IVH and developing hydrocephalus. Warfarin reversed. EVD placed following worsening hydrocephalus.   1/30: CorTrak placed. Cleviprex  weaned off.  1/31: Sedation off.   INTERVAL HISTORY Granddaughter and daughter are at bedside.  Patient sister on the phone.  Patient put back on CPAP with pressure support this morning. No significant neuro change, still not open eyes on voice, but localizing to pain with RUE. Still on cleviprex  and will need more po BP meds.    Vitals:   04/21/24 0730 04/21/24 0745 04/21/24 1108 04/21/24 1200  BP: (!) 161/88 (!) 155/90    Pulse: (!) 103 100    Resp: (!) 30 (!) 29    Temp: 100 F (37.8 C)   (!) 100.6 F (38.1 C)  TempSrc: Axillary   Axillary  SpO2: 100% 99% 97%   Weight:      Height:       CBC:  Recent Labs  Lab 04/20/24 0509 04/21/24 0457  WBC 9.2 9.3  HGB 9.8* 10.5*  HCT 31.8* 33.5*  MCV 82.0 81.3  PLT 316 337   Basic Metabolic Panel:  Recent Labs  Lab 04/20/24 0509 04/20/24 1148 04/21/24 0457 04/21/24 1052  NA 152*   < > 151* 151*  K 3.3*  --  3.8  --   CL 117*  --  115*  --   CO2 24  --  22  --   GLUCOSE 169*  --  211*  --   BUN 15  --  18  --   CREATININE 0.75  --  0.80  --   CALCIUM  9.1  --  9.2  --   MG 2.0  --  1.9  --   PHOS 2.4*  --  2.9  --    < > = values in this interval not displayed.   Lipid Panel:  Recent Labs  Lab 04/19/24 0427 04/21/24 0457  CHOL 169  --   TRIG 151* 233*  HDL 60  --   CHOLHDL 2.8  --   VLDL 30  --   LDLCALC 79  --    Urine Drug Screen:  Recent Labs  Lab 04/18/24 2330  LABOPIA NEGATIVE  COCAINSCRNUR NEGATIVE  LABBENZ POSITIVE*  AMPHETMU NEGATIVE  THCU NEGATIVE  LABBARB NEGATIVE    Alcohol Level  Recent Labs  Lab 04/16/24 2207  ETH <15    IMAGING past  24 hours No results found.   PHYSICAL EXAM General - Well nourished, well developed, intubated on no sedation.  Ophthalmologic - fundi not visualized due to noncooperation. Cardiovascular - Regular rate and rhythm. Respiratory- CPAP support on ventilator.  Neuro -  Pt is intubated off sedation, eyes closed, not following commands. With forced eye opening, eyes in R gaze position, not blinking to visual threat, doll's eyes absent, not tracking, pupils 2mm, sluggish to light. Corneal reflex present weakly on the R, gag and cough present. Breathing over the vent, on CPAP with PS.  Facial symmetry not able to test due to ET tube.  Tongue protrusion not cooperative. On pain stimulation, localizing with RUE and barely against gravity. Slight withdraw BLEs and no movement of LUE. Sensation, coordination and gait not tested.   ASSESSMENT/PLAN [name] is a 69 y.o. 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 CT showed R brain IPH w/ IVH and developing hydrocephalus. Warfarin reversed. EVD placed following worsening hydrocephalus.    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 Hydrocephalus.  CTA head & neck worsening R lateral IVH. No LVO or stenoses.  CT head 1/29 worsening R IVH w/ ventricular trapping, new basilar SAH w/ trace SAH L brain. 2mm R to L midline shift CT head 1/29 0850 decreasing hydrocephalus, R frontal EVD, 2mm midline shift remains Repeat CT head 1/30 No significant interval change in appearance of bilateral intraventricular hemorrhage, primarily within the right lateral ventricle, with blood again demonstrated within the third and  fourth ventricles. Unchanged 5 mm right to left midline shift. Similar degree of hydrocephalus MRI w/wo Large intraventricular/periventricular hemorrhage, relatively stable, approximately 4mm of right-to-left shift. Few patchy foci of restricted diffusion about the falx and within the periventricular and peripheral right cerebral hemisphere, consistent with small acute ischemic infarcts. Right frontal approach EVD in place with tip in the left lateral ventricle. Stable ventricular size and morphology without worsened hydrocephalus. Small volume subarachnoid hemorrhage about the basilar cisterns,stable. CT repeat in am 2D Echo 07/2023 EF 60-65%. No source of embolus LDL 79 A1C 6.9 VTE prophylaxis - lovenox   warfarin daily prior to admission, now on No antithrombotic given ICH Therapy recommendations:  pending  Disposition:  pending   Cerebral Edema Acute Hydrocephalus EVD placed 1/29 ([facility]) EVD drainage has improved overnight 3% stopped 1/30-> NS @ 40->off 23.4% bolus x 1 on 2/2 Na 135->146->145->147->154->155-155-154-155-153-152-159--152-151 Repeat Na q6h Na Chandler 150-155  Positive lupus Anticoagulant Hx DVT On chronic warfarin, reversed This admission INR 3.1->Kcentra ->1.2 Hx UGIB w/ polyp removal 08/2022;  admission 07/2023 w/ supratherapeutic INR and anemia  Hypoxic Respiratory Failure Intubated 1/29 d/t neuro decline CCM on board On SBT Off precedex  Not candidate for extubation due to mental status Tmax 100.4->100.6->100.7 Leukocytosis WBC 11.1--9.2--9.3 Sputum culture MRSA Now on Rocephin ->Ancef  D/c seroquel  150mg  daily  Hypertensive Emergency Home meds:  clonidine  0.1 bid, diltiazem  cd 240, lasix  40 high on arrival  Cleviprex  gtt, wean off as able On clonidine  0.3 tid and cozaar  100 Add amlodipine  10 and coreg  12.5 bid SBP Clerk < 160  Hyperlipidemia Home meds:  crestor  10mg  LDL 79, Dinunzio < 70 Hold statin in setting of IPH Consider statin at discharge  Diabetes  type II controlled Home meds:  metformin , ozempic  HgbA1c 6.9, at [place] < 7.0 CBGs SSI Outpt PCP follow up for DM control  Dysphagia Secondary to stroke NPO Speech on board CorTrak in place On TF @ 45   Other Stroke Risk Factors Advanced Age >/= 36  Remote hx cocaine use (quit 2003, reab 2005). UDS positive this admission for benzos and fentanyl  (given inpatient) Morbid Obesity, Body mass index is 44.87 kg/m. 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 This patient's care requires constant monitoring of vital signs, hemodynamics, respiratory and cardiac monitoring, review of multiple databases, neurological assessment, discussion with family, other specialists and medical decision making of high complexity. I spent 40 minutes of neurocritical care time in the care of this patient. I had long discussion with granddaughter and daughter at bedside and sister over the phone, updated pt current condition, treatment plan and potential prognosis, and answered all the questions. They expressed understanding and appreciation. Discussed with Dr. Harold CCM

## 2024-04-21 NOTE — Progress Notes (Signed)
 OT Sign off  Note  Patient Details Name: [name] MRN: [id number] DOB: 07-14-1955   Cancelled Treatment:    Reason Eval/Treat Not Completed: Patient not medically ready ( EVD/ Vent/ slightly w/d in all extremities, no eye opening, on minimum sedation) Pt with no changes for participation in therapy since 1/30 will sign off and await reorder. Please reorder when appropriate.   Ely Molt 04/21/2024, 6:37 AM

## 2024-04-22 ENCOUNTER — Inpatient Hospital Stay (HOSPITAL_COMMUNITY)

## 2024-04-22 DIAGNOSIS — D509 Iron deficiency anemia, unspecified: Secondary | ICD-10-CM

## 2024-04-22 DIAGNOSIS — J9601 Acute respiratory failure with hypoxia: Secondary | ICD-10-CM | POA: Diagnosis not present

## 2024-04-22 DIAGNOSIS — I610 Nontraumatic intracerebral hemorrhage in hemisphere, subcortical: Secondary | ICD-10-CM | POA: Diagnosis not present

## 2024-04-22 DIAGNOSIS — I615 Nontraumatic intracerebral hemorrhage, intraventricular: Secondary | ICD-10-CM | POA: Diagnosis not present

## 2024-04-22 DIAGNOSIS — J45909 Unspecified asthma, uncomplicated: Secondary | ICD-10-CM | POA: Diagnosis not present

## 2024-04-22 DIAGNOSIS — G4733 Obstructive sleep apnea (adult) (pediatric): Secondary | ICD-10-CM | POA: Diagnosis not present

## 2024-04-22 DIAGNOSIS — E119 Type 2 diabetes mellitus without complications: Secondary | ICD-10-CM | POA: Diagnosis not present

## 2024-04-22 DIAGNOSIS — I609 Nontraumatic subarachnoid hemorrhage, unspecified: Secondary | ICD-10-CM | POA: Diagnosis not present

## 2024-04-22 DIAGNOSIS — G936 Cerebral edema: Secondary | ICD-10-CM | POA: Diagnosis not present

## 2024-04-22 DIAGNOSIS — J15212 Pneumonia due to Methicillin resistant Staphylococcus aureus: Secondary | ICD-10-CM

## 2024-04-22 DIAGNOSIS — J690 Pneumonitis due to inhalation of food and vomit: Secondary | ICD-10-CM | POA: Diagnosis not present

## 2024-04-22 DIAGNOSIS — G919 Hydrocephalus, unspecified: Secondary | ICD-10-CM | POA: Diagnosis not present

## 2024-04-22 DIAGNOSIS — E870 Hyperosmolality and hypernatremia: Secondary | ICD-10-CM | POA: Diagnosis not present

## 2024-04-22 DIAGNOSIS — G911 Obstructive hydrocephalus: Secondary | ICD-10-CM | POA: Diagnosis not present

## 2024-04-22 DIAGNOSIS — I161 Hypertensive emergency: Secondary | ICD-10-CM | POA: Diagnosis not present

## 2024-04-22 LAB — CBC
HCT: 32.2 % — ABNORMAL LOW (ref 36.0–46.0)
Hemoglobin: 10.1 g/dL — ABNORMAL LOW (ref 12.0–15.0)
MCH: 25.5 pg — ABNORMAL LOW (ref 26.0–34.0)
MCHC: 31.4 g/dL (ref 30.0–36.0)
MCV: 81.3 fL (ref 80.0–100.0)
Platelets: 334 10*3/uL (ref 150–400)
RBC: 3.96 MIL/uL (ref 3.87–5.11)
RDW: 18.3 % — ABNORMAL HIGH (ref 11.5–15.5)
WBC: 11 10*3/uL — ABNORMAL HIGH (ref 4.0–10.5)
nRBC: 0 % (ref 0.0–0.2)

## 2024-04-22 LAB — PHOSPHORUS: Phosphorus: 2.4 mg/dL — ABNORMAL LOW (ref 2.5–4.6)

## 2024-04-22 LAB — BASIC METABOLIC PANEL WITH GFR
Anion gap: 11 (ref 5–15)
BUN: 23 mg/dL (ref 8–23)
CO2: 22 mmol/L (ref 22–32)
Calcium: 9 mg/dL (ref 8.9–10.3)
Chloride: 123 mmol/L — ABNORMAL HIGH (ref 98–111)
Creatinine, Ser: 0.79 mg/dL (ref 0.44–1.00)
GFR, Estimated: >60 mL/min
Glucose, Bld: 179 mg/dL — ABNORMAL HIGH (ref 70–99)
Potassium: 3.7 mmol/L (ref 3.5–5.1)
Sodium: 156 mmol/L — ABNORMAL HIGH (ref 135–145)

## 2024-04-22 LAB — SODIUM
Sodium: 153 mmol/L — ABNORMAL HIGH (ref 135–145)
Sodium: 155 mmol/L — ABNORMAL HIGH (ref 135–145)
Sodium: 155 mmol/L — ABNORMAL HIGH (ref 135–145)
Sodium: 157 mmol/L — ABNORMAL HIGH (ref 135–145)
Sodium: 158 mmol/L — ABNORMAL HIGH (ref 135–145)
Sodium: 167 mmol/L (ref 135–145)

## 2024-04-22 LAB — GLUCOSE, CAPILLARY
Glucose-Capillary: 140 mg/dL — ABNORMAL HIGH (ref 70–99)
Glucose-Capillary: 160 mg/dL — ABNORMAL HIGH (ref 70–99)
Glucose-Capillary: 170 mg/dL — ABNORMAL HIGH (ref 70–99)
Glucose-Capillary: 180 mg/dL — ABNORMAL HIGH (ref 70–99)
Glucose-Capillary: 181 mg/dL — ABNORMAL HIGH (ref 70–99)
Glucose-Capillary: 205 mg/dL — ABNORMAL HIGH (ref 70–99)

## 2024-04-22 MED ORDER — BETHANECHOL CHLORIDE 10 MG PO TABS
10.0000 mg | ORAL_TABLET | Freq: Three times a day (TID) | ORAL | Status: AC
  Administered 2024-04-22 – 2024-04-24 (×9): 10 mg
  Filled 2024-04-22 (×9): qty 1

## 2024-04-22 MED ORDER — CARVEDILOL 12.5 MG PO TABS
12.5000 mg | ORAL_TABLET | Freq: Once | ORAL | Status: AC
  Administered 2024-04-22: 12.5 mg
  Filled 2024-04-22: qty 1

## 2024-04-22 MED ORDER — ALBUTEROL SULFATE (2.5 MG/3ML) 0.083% IN NEBU: 2.5000 mg | INHALATION_SOLUTION | RESPIRATORY_TRACT | Status: AC | PRN

## 2024-04-22 MED ORDER — SODIUM CHLORIDE 0.9 % IV SOLN
30.0000 mmol | Freq: Once | INTRAVENOUS | Status: AC
  Administered 2024-04-22: 30 mmol via INTRAVENOUS
  Filled 2024-04-22: qty 10

## 2024-04-22 MED ORDER — CARVEDILOL 12.5 MG PO TABS
25.0000 mg | ORAL_TABLET | Freq: Two times a day (BID) | ORAL | Status: AC
  Administered 2024-04-22 – 2024-04-25 (×6): 25 mg
  Filled 2024-04-22 (×7): qty 2

## 2024-04-22 NOTE — Progress Notes (Addendum)
 "  NAME:  [name], MRN:  [id number], DOB:  1955-09-06, LOS: 6 ADMISSION DATE:  04/16/2024, CONSULTATION DATE:  04/17/24 REFERRING MD:  neuro, CHIEF COMPLAINT:  ich and declining mental status   History of Present Illness:  69 yo female presented with c/o headache n/v. Pt found to have acute R ICH with ventricular hemorrhage and moderate hydrocephalus.  Prior to presentation pt reportedly called her neighbor stating she was not feeling well and that her head hurt. She was complaining of n/v/d with concern she was having food poisoning. History was obtained from chart and family as while pt was following commands she was requiring relatively freq stimulation to arouse.   While initially, pt was able to arouse and follow commands with some L le weakness however during her time here her mentation continued to decline. Repeat scan revealed worsening bleed with new SAH and 2mm midline shift. Neurosx consulted and placed bedside EVD. No improvement in mentation. Now with sonorous respirations and unable to arouse. Habitus and chronic cpap use precludes pt to obstruct airway and decision was made to electively intubate at this time to protect airway 2/2 declining mental status. Family at bedside and updated on critical state, worsening bleed and mental status and need for intubation. Family confirms full code  Pertinent  Medical History  HTN Hyperlipidemia Dm2 with hyperglycemia Migraines Chronic a/c 2/2 + lupus and h/o bl dvt (coumadin ) OSA on cpap L spine stenosis s/p decompression  Significant Hospital Events: Including procedures, antibiotic start and stop dates in addition to other pertinent events   Admitted to neuro ICU 1/28 Evd placement 1/29 Ccm consulted for intubation 1/29 1/30: Intubated, sedated, remain afebrile, off clevidipine  infusion 1/31 patient spiked fever with Tmax 100.4, remained on low-dose Precedex .  Still remained unresponsive 1/31 off all sedation   Interim  History / Subjective:   Febrile overnight and this morning Tmax 100.69F. Remains on cleviprex  this morning at 2mg /hr. Patient is moving her RUE however not purposeful, does not follow commands or open eyes this morning. Daughter updated at bedside.    Objective    Blood pressure (!) 149/76, pulse 83, temperature (!) 100.6 F (38.1 C), temperature source Axillary, resp. rate (!) 26, height 5' 6 (1.676 m), weight 126.9 kg, SpO2 99%.    Vent Mode: PSV;CPAP FiO2 (%):  [30 %] 30 % Set Rate:  [20 bmp] 20 bmp Vt Set:  [470 mL] 470 mL PEEP:  [5 cmH20] 5 cmH20 Pressure Support:  [5 cmH20-10 cmH20] 5 cmH20   Intake/Output Summary (Last 24 hours) at 04/22/2024 0855 Last data filed at 04/22/2024 0700 Gross per 24 hour  Intake 1936.4 ml  Output 1254 ml  Net 682.4 ml   Filed Weights   04/20/24 0500 04/21/24 0500 04/22/24 0500  Weight: 126 kg 126.1 kg 126.9 kg     Physical Exam Vitals and nursing note reviewed.  Constitutional:      Appearance: She is obese.     Comments: Acute on chronically ill appearing female. On ventilator, off sedation  HENT:     Head:     Comments: Right frontal EVD Cardiovascular:     Rate and Rhythm: Normal rate and regular rhythm.  Pulmonary:     Effort: Pulmonary effort is normal.     Breath sounds: Wheezing (faint expiratory wheeze) present.     Comments: Ventilator, FIO2 30% peep 5 Abdominal:     General: Abdomen is flat. There is no distension.     Palpations: Abdomen is  soft.     Tenderness: There is no abdominal tenderness.  Genitourinary:    Comments: Foley  Musculoskeletal:     Right lower leg: No edema.     Left lower leg: No edema.  Skin:    General: Skin is warm and dry.  Neurological:     Comments: Moving RUE, does not follow commands.          Patient Lines/Drains/Airways Status     Active Line/Drains/Airways     Name Placement date Placement time Site Days   Peripheral IV 04/16/24 20 G 1 Anterior;Left Forearm 04/16/24  2202   Forearm  6   PICC Triple Lumen 04/17/24 Right Brachial 41 cm 1 cm 04/17/24  1545  -- 5   Flatus Tube/Pouch 04/21/24  2000  --  1   Urethral Catheter Bard Gasmen, RN 14 Fr. 04/18/24  0015  --  4   ICP/Ventriculostomy Ventricular drainage catheter Right Temporal region 04/17/24  0200  Temporal region  5   Airway 7.5 mm 04/17/24  0445  -- 5   Small Bore Feeding Tube 10 Fr. Left nare Marking at nare/corner of mouth 65 cm 04/18/24  1523  Left nare  4            Resolved problem list    Assessment and Plan  Acute right periventricular intraparenchymal hemorrhage with IVH in the setting of anticoagulation with warfarin and hypertensive emergency Acute obstructive hydrocephalus status post EVD Cerebral edema with brain compression Hypertensive emergency Acute encephalopathy in the setting of acute right intraparenchymal hemorrhage Induced hypernatremia Patient presented with acute onset headache, nausea with vomiting, diarrhea. Takes Coumadin  with INR 3.1 on arrival, reversed with Kcentra  and Vit K. Patient became difficult to arouse shortly after admission, with R gaze preference, and difficulty moving left side. Repeat CTH showed worsening hemorrhage with subarachnoid hemorrhage and 2mm mass effect. NSGY placed EVD 1/29. -Management per NSGY -Has been off sedation since 1/31, does not follow commands -Lovenox  for DVT prophylaxis  -SBP [place] 130-160; remains on cleviprex  will attempt to wean -PO antihypertensives: Amlodipine  10mg , losartan  100mg , clonidine  0.3mg  TID, coreg  12.5mg  BID -Na+ [place] 150-155, 3% saline restarted 2/2 w/ further adjustments per NSGY -Repeat CTH 2/3 stable  Acute hypoxic respiratory failure  Aspiration/ MRSA PNA Asthma  -Continue daily SBT, tolerating pressure support, however neuro exam/mental status remains a barrier to extubation  -VAP protocol -Sputum culture: MRSA, started on Linezolid  2/2 -PRN albuterol  added   Normocytic anemia 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  status post reversal positive lupus anticoagulant, beta-2 -glycoprotein IgA antibody  -Holding anticoagulation and antiplatelets -Will need outpatient follow up with hematology/rheumatology    Type II Diabetes  A1c 6.9  -Continue SSI, CBG q4, [place] 140-180  OSA - CPAP nightly once extubated  Morbid obesity At risk for malnutrition - Diet and exercise counseling when appropriate - EN per RD, thiamine   - cont scheduled senokot and miralax , add lactulose  20gm q 2 till bowel movement  Hypokalemia/hypophosphatemia - trend on labs, replete as needed       CRITICAL CARE: 32 minutes  Keven Fila, PA-C [facility] For pager details, please see AMION or use Epic chat  After 1900, please call [facility] for cross coverage needs 04/22/2024, 9:12 AM          "

## 2024-04-22 NOTE — Progress Notes (Signed)
 SLP Cancellation Note  Patient Details Name: Cristine Daw Grullon MRN: [id number] DOB: 06-09-1955   Cancelled treatment:       Reason Eval/Treat Not Completed: Patient not medically ready. Remains unresponsive per chart. Will sign off and await new orders when appropriate for therapy   Keith Cancio, Consuelo Fitch 04/22/2024, 9:23 AM

## 2024-04-22 NOTE — Progress Notes (Signed)
 Patient ID: [name], female   DOB: October 16, 1955, 69 y.o.   MRN: [id number] BP (!) 149/92   Pulse 82   Temp (!) 102 F (38.9 C) (Axillary)   Resp (!) 34   Ht 5' 6 (1.676 m)   Wt 126.9 kg   SpO2 100%   BMI 45.16 kg/m  Family stated she opened her eyes today Purposeful with right upper extremity Ventricular catheter is draining well, remains sanguineous.  Slow progress, still weaning on vent.

## 2024-04-22 NOTE — Progress Notes (Signed)
 Transported from 4N25 to CT and then back to 4N25 without complications

## 2024-04-22 NOTE — Progress Notes (Signed)
 VENTILATOR WEAN NOTE 04/22/2024  Start Mode: PRVC  Wean Mode: Pressure Support  Duration before failure:   Reason for failure: N/A  Notes: Patient placed on vent wean PS/CPAP 5/5 and is tolerating well at this time. RN and family notified.

## 2024-04-23 ENCOUNTER — Encounter (HOSPITAL_COMMUNITY): Payer: Self-pay | Admitting: Neurology

## 2024-04-23 DIAGNOSIS — G936 Cerebral edema: Secondary | ICD-10-CM | POA: Diagnosis not present

## 2024-04-23 DIAGNOSIS — G911 Obstructive hydrocephalus: Secondary | ICD-10-CM | POA: Diagnosis not present

## 2024-04-23 DIAGNOSIS — E119 Type 2 diabetes mellitus without complications: Secondary | ICD-10-CM | POA: Diagnosis not present

## 2024-04-23 DIAGNOSIS — E876 Hypokalemia: Secondary | ICD-10-CM | POA: Diagnosis not present

## 2024-04-23 DIAGNOSIS — I615 Nontraumatic intracerebral hemorrhage, intraventricular: Secondary | ICD-10-CM | POA: Diagnosis not present

## 2024-04-23 DIAGNOSIS — J15212 Pneumonia due to Methicillin resistant Staphylococcus aureus: Secondary | ICD-10-CM | POA: Diagnosis not present

## 2024-04-23 DIAGNOSIS — J9601 Acute respiratory failure with hypoxia: Secondary | ICD-10-CM | POA: Diagnosis not present

## 2024-04-23 LAB — BASIC METABOLIC PANEL WITH GFR
Anion gap: 11 (ref 5–15)
BUN: 21 mg/dL (ref 8–23)
CO2: 22 mmol/L (ref 22–32)
Calcium: 8.7 mg/dL — ABNORMAL LOW (ref 8.9–10.3)
Chloride: 122 mmol/L — ABNORMAL HIGH (ref 98–111)
Creatinine, Ser: 0.8 mg/dL (ref 0.44–1.00)
GFR, Estimated: >60 mL/min
Glucose, Bld: 194 mg/dL — ABNORMAL HIGH (ref 70–99)
Potassium: 3.6 mmol/L (ref 3.5–5.1)
Sodium: 155 mmol/L — ABNORMAL HIGH (ref 135–145)

## 2024-04-23 LAB — SODIUM
Sodium: 153 mmol/L — ABNORMAL HIGH (ref 135–145)
Sodium: 152 mmol/L — ABNORMAL HIGH (ref 135–145)

## 2024-04-23 LAB — CBC
HCT: 30.5 % — ABNORMAL LOW (ref 36.0–46.0)
Hemoglobin: 9.3 g/dL — ABNORMAL LOW (ref 12.0–15.0)
MCH: 25.4 pg — ABNORMAL LOW (ref 26.0–34.0)
MCHC: 30.5 g/dL (ref 30.0–36.0)
MCV: 83.3 fL (ref 80.0–100.0)
Platelets: 295 10*3/uL (ref 150–400)
RBC: 3.66 MIL/uL — ABNORMAL LOW (ref 3.87–5.11)
RDW: 18.5 % — ABNORMAL HIGH (ref 11.5–15.5)
WBC: 8.9 10*3/uL (ref 4.0–10.5)
nRBC: 0.2 % (ref 0.0–0.2)

## 2024-04-23 LAB — GLUCOSE, CAPILLARY
Glucose-Capillary: 153 mg/dL — ABNORMAL HIGH (ref 70–99)
Glucose-Capillary: 167 mg/dL — ABNORMAL HIGH (ref 70–99)
Glucose-Capillary: 173 mg/dL — ABNORMAL HIGH (ref 70–99)
Glucose-Capillary: 179 mg/dL — ABNORMAL HIGH (ref 70–99)
Glucose-Capillary: 183 mg/dL — ABNORMAL HIGH (ref 70–99)
Glucose-Capillary: 188 mg/dL — ABNORMAL HIGH (ref 70–99)

## 2024-04-23 LAB — PHOSPHORUS: Phosphorus: 2.1 mg/dL — ABNORMAL LOW (ref 2.5–4.6)

## 2024-04-23 MED ORDER — INSULIN ASPART 100 UNIT/ML IJ SOLN
0.0000 [IU] | INTRAMUSCULAR | Status: AC
  Administered 2024-04-23 (×3): 4 [IU] via SUBCUTANEOUS
  Administered 2024-04-24 (×4): 3 [IU] via SUBCUTANEOUS
  Administered 2024-04-24 – 2024-04-25 (×3): 4 [IU] via SUBCUTANEOUS
  Administered 2024-04-25: 3 [IU] via SUBCUTANEOUS
  Administered 2024-04-25 (×2): 4 [IU] via SUBCUTANEOUS
  Administered 2024-04-25 (×2): 3 [IU] via SUBCUTANEOUS
  Filled 2024-04-23 (×2): qty 3
  Filled 2024-04-23 (×2): qty 4
  Filled 2024-04-23: qty 5
  Filled 2024-04-23 (×2): qty 4
  Filled 2024-04-23 (×3): qty 3
  Filled 2024-04-23 (×2): qty 4
  Filled 2024-04-23: qty 5
  Filled 2024-04-23: qty 4

## 2024-04-23 MED ORDER — SODIUM CHLORIDE 0.9 % IV SOLN
45.0000 mmol | Freq: Once | INTRAVENOUS | Status: AC
  Administered 2024-04-23: 45 mmol via INTRAVENOUS
  Filled 2024-04-23: qty 15

## 2024-04-23 MED ORDER — FENTANYL CITRATE (PF) 50 MCG/ML IJ SOSY
200.0000 ug | PREFILLED_SYRINGE | Freq: Once | INTRAMUSCULAR | Status: AC
  Administered 2024-04-24 (×2): 100 ug via INTRAVENOUS
  Filled 2024-04-23: qty 4

## 2024-04-23 MED ORDER — LIDOCAINE-EPINEPHRINE 1 %-1:100000 IJ SOLN
20.0000 mL | Freq: Once | INTRAMUSCULAR | Status: AC
  Filled 2024-04-23: qty 1

## 2024-04-23 MED ORDER — MIDAZOLAM HCL (PF) 2 MG/2ML IJ SOLN
5.0000 mg | Freq: Once | INTRAMUSCULAR | Status: AC
  Administered 2024-04-24: 2 mg via INTRAVENOUS
  Filled 2024-04-23: qty 6

## 2024-04-23 MED ORDER — ROCURONIUM BROMIDE 10 MG/ML (PF) SYRINGE
100.0000 mg | PREFILLED_SYRINGE | Freq: Once | INTRAVENOUS | Status: AC
  Administered 2024-04-24: 100 mg via INTRAVENOUS
  Filled 2024-04-23: qty 10

## 2024-04-23 MED ORDER — ETOMIDATE 2 MG/ML IV SOLN
20.0000 mg | Freq: Once | INTRAVENOUS | Status: AC
  Administered 2024-04-24: 20 mg via INTRAVENOUS
  Filled 2024-04-23: qty 10

## 2024-04-23 MED ORDER — POTASSIUM PHOSPHATES 15 MMOLE/5ML IV SOLN
45.0000 mmol | Freq: Once | INTRAVENOUS | Status: DC
  Filled 2024-04-23: qty 15

## 2024-04-23 MED ORDER — HYDRALAZINE HCL 50 MG PO TABS
50.0000 mg | ORAL_TABLET | Freq: Three times a day (TID) | ORAL | Status: AC
  Administered 2024-04-23 – 2024-04-25 (×5): 50 mg
  Filled 2024-04-23 (×6): qty 1

## 2024-04-23 MED ORDER — ENOXAPARIN SODIUM 40 MG/0.4ML IJ SOSY: 40.0000 mg | PREFILLED_SYRINGE | INTRAMUSCULAR | Status: DC

## 2024-04-23 MED ORDER — SODIUM CHLORIDE 0.9 % IV SOLN: INTRAVENOUS | Status: AC

## 2024-04-23 MED ORDER — LABETALOL HCL 5 MG/ML IV SOLN: 10.0000 mg | Freq: Four times a day (QID) | INTRAVENOUS | Status: AC | PRN

## 2024-04-23 NOTE — Progress Notes (Addendum)
 "  NAME:  [name], MRN:  [id number], DOB:  19-Jul-1955, LOS: 7 ADMISSION DATE:  04/16/2024, CONSULTATION DATE:  04/17/24 REFERRING MD:  neuro, CHIEF COMPLAINT:  ich and declining mental status   History of Present Illness:  69 yo female presented with c/o headache n/v. Pt found to have acute R ICH with ventricular hemorrhage and moderate hydrocephalus.  Prior to presentation pt reportedly called her neighbor stating she was not feeling well and that her head hurt. She was complaining of n/v/d with concern she was having food poisoning. History was obtained from chart and family as while pt was following commands she was requiring relatively freq stimulation to arouse.   While initially, pt was able to arouse and follow commands with some L le weakness however during her time here her mentation continued to decline. Repeat scan revealed worsening bleed with new SAH and 2mm midline shift. Neurosx consulted and placed bedside EVD. No improvement in mentation. Now with sonorous respirations and unable to arouse. Habitus and chronic cpap use precludes pt to obstruct airway and decision was made to electively intubate at this time to protect airway 2/2 declining mental status. Family at bedside and updated on critical state, worsening bleed and mental status and need for intubation. Family confirms full code  Pertinent  Medical History  HTN Hyperlipidemia Dm2 with hyperglycemia Migraines Chronic a/c 2/2 + lupus and h/o bl dvt (coumadin ) OSA on cpap L spine stenosis s/p decompression  Significant Hospital Events: Including procedures, antibiotic start and stop dates in addition to other pertinent events   Admitted to neuro ICU 1/28 Evd placement 1/29 Ccm consulted for intubation 1/29 1/30: Intubated, sedated, remain afebrile, off clevidipine  infusion 1/31 patient spiked fever with Tmax 100.4, remained on low-dose Precedex .  Still remained unresponsive 1/31 off all sedation  2/4: palliative  care consulted  Interim History / Subjective:   Patient febrile overnight. Patients remains off sedation. Moves RUE purposefully, but does not follow commands. Plan for trach/peg tomorrow.   Objective    Blood pressure (!) 165/105, pulse 82, temperature 100.3 F (37.9 C), temperature source Axillary, resp. rate (!) 32, height 5' 6 (1.676 m), weight 127.8 kg, SpO2 100%.    Vent Mode: CPAP;PSV FiO2 (%):  [30 %] 30 % Set Rate:  [20 bmp] 20 bmp Vt Set:  [470 mL] 470 mL PEEP:  [5 cmH20] 5 cmH20 Pressure Support:  [5 cmH20-12 cmH20] 12 cmH20 Plateau Pressure:  [15 cmH20] 15 cmH20   Intake/Output Summary (Last 24 hours) at 04/23/2024 1058 Last data filed at 04/23/2024 1000 Gross per 24 hour  Intake 1572.15 ml  Output 1823 ml  Net -250.85 ml   Filed Weights   04/21/24 0500 04/22/24 0500 04/23/24 0500  Weight: 126.1 kg 126.9 kg 127.8 kg     Physical Exam Vitals and nursing note reviewed.  Constitutional:      Appearance: She is obese.     Comments: Acute on chronically ill appearing female, off sedation, remains on ventilator  HENT:     Head:     Comments: R frontal EVD    Right Ear: External ear normal.     Left Ear: External ear normal.     Nose: Nose normal.     Mouth/Throat:     Pharynx: Oropharynx is clear.  Eyes:     Pupils: Pupils are equal, round, and reactive to light.  Cardiovascular:     Rate and Rhythm: Normal rate and regular rhythm.  Pulmonary:  Effort: Pulmonary effort is normal.     Breath sounds: Wheezing (faint expiratory wheeze) present.     Comments: Remains on ventilator Abdominal:     General: Abdomen is flat. There is no distension.     Palpations: Abdomen is soft.     Tenderness: There is no abdominal tenderness.  Genitourinary:    Comments: foley Musculoskeletal:     Right lower leg: No edema.     Left lower leg: No edema.  Skin:    General: Skin is warm and dry.  Neurological:     Comments: Moving RUE purposefully but does not follow  commands. LUE movement to painful stimuli. Bilateral lower extremities respond to painful stimuli.         Patient Lines/Drains/Airways Status     Active Line/Drains/Airways     Name Placement date Placement time Site Days   Peripheral IV 04/16/24 20 G 1 Anterior;Left Forearm 04/16/24  2202  Forearm  7   PICC Triple Lumen 04/17/24 Right Brachial 41 cm 1 cm 04/17/24  1545  -- 6   Flatus Tube/Pouch 04/21/24  2000  --  2   Urethral Catheter Bard Gasmen, RN 14 Fr. 04/18/24  0015  --  5   ICP/Ventriculostomy Ventricular drainage catheter Right Temporal region 04/17/24  0200  Temporal region  6   Airway 7.5 mm 04/17/24  0445  -- 6   Small Bore Feeding Tube 10 Fr. Left nare Marking at nare/corner of mouth 65 cm 04/18/24  1523  Left nare  5            Resolved problem list    Assessment and Plan  Acute right periventricular intraparenchymal hemorrhage with IVH in the setting of anticoagulation with warfarin and hypertensive emergency Acute obstructive hydrocephalus status post EVD Cerebral edema with brain compression Hypertensive emergency Acute encephalopathy in the setting of acute right intraparenchymal hemorrhage Induced hypernatremia Patient presented with acute onset headache, nausea with vomiting, diarrhea. Takes Coumadin  with INR 3.1 on arrival, reversed with Kcentra  and Vit K. Patient became difficult to arouse shortly after admission, with R gaze preference, and difficulty moving left side. Repeat CTH showed worsening hemorrhage with subarachnoid hemorrhage and 2mm mass effect. NSGY placed EVD 1/29. -NSGY management  -Repeat CTH stable 2/3 -SBP [place] <160; Amlodipine  10mg , coreg  25mg  BID, losartan  100mg , clonidine  0.3mg  TID -Na 150-155; 3% saline held overnight  -Off sedation since 1/31, not following commands -Plan for trach/peg tomorrow   Acute hypoxic respiratory failure  Aspiration/ MRSA PNA Asthma  -Tolerating pressure support on daily SBT, neuro exam remains  unchanged and is barrier to extubation-->plan for trach tomorrow  -VAP protocol -Respiratory culture: MRSA, linezolid  2/2  Normocytic anemia 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  status post reversal positive lupus anticoagulant, beta-2 -glycoprotein IgA antibody  -Holding anticoagulation and antiplatelets -Will need outpatient follow up with hematology/rheumatology    Type II Diabetes  A1c 6.9  -Continue SSI, CBG q4, [place] 140-180  OSA - CPAP nightly once extubated  Morbid obesity At risk for malnutrition - Diet and exercise counseling when appropriate - EN per RD, thiamine   - cont scheduled senokot and miralax , add lactulose  20gm q 2 till bowel movement  Hypokalemia/hypophosphatemia - trend on labs, replete as needed       CRITICAL CARE: 30 minutes  Keven Fila, PA-C [facility] For pager details, please see AMION or use Epic chat  After 1900, please call ELINK for cross coverage needs 04/23/2024, 10:58 AM          "

## 2024-04-23 NOTE — Consult Note (Cosign Needed Addendum)
 "    [name] 05-26-1955  [id number].    Requesting MD: Dr. Valinda Novas Chief Complaint/Reason for Consult: need for feeding access  HPI:  This is a 69 yo black female with a history of HTN, HLD, DM, chronic anticoagulation secondary to lupus and previous DVTs, OSA, hypothyroidism, migraines, COPD/OSA, on O2 at night, who was admitted on 1/28 secondary to a severe HA with N/V/D.  She was found to have a SAH with a 2mm midline shift.  Neurosurgery was consulted and a bedside EVD was placements.  She has requiring intubation and remains on the ventilators.  She does withdrawal to painful stimuli on the right side, otherwise non-interactive for my assessment.  CCM is planning for tracheostomy placement tomorrow and has asked for a PEG tube to be placed.    ROS: ROS: unable, on vent  Family History  Problem Relation Age of Onset   Hypertension Mother    Diabetes Mother    Cancer Mother        Pancreatic   Hypertension Father    Heart failure Father    Hyperlipidemia Other    COPD Other     Past Medical History:  Diagnosis Date   Adrenal mass 03/226   Benign   Arthritis    knees/multiple orthopedic conditons; lower back   Asthma    per pt   Clotting disorder    +beta-2 -glycoprotein IgA antibody   COPD (chronic obstructive pulmonary disease) (HCC)    inhalers dependent on environment   Depression    Diabetes mellitus    120s usually fasting -  dx more than 10 yrs ago   Dizziness, nonspecific    DVT (deep venous thrombosis) (HCC)    Recurrent   Fibromyalgia    GERD (gastroesophageal reflux disease)    Heart murmur    History of blood transfusion    History of cocaine abuse (HCC)    Remote history    History of kidney stones    Hyperlipidemia    Hypertension    takes meds daily   Hypothyroidism    Lupus Anticoagulant Positive    On home oxygen  therapy    at night   Seizure (HCC)    no seizure in 1.5-2 years per patient (12/17/23)   Shortness of breath     exertion or lying flat   Sleep apnea    2l of oxygen  at night (as of 12/6, she used to)- wears CPAP    Past Surgical History:  Procedure Laterality Date   ABDOMINAL HYSTERECTOMY  1989   Fibroids   ANTERIOR LUMBAR FUSION  01/03/2012   Procedure: ANTERIOR LUMBAR FUSION 1 LEVEL;  Surgeon: Rockey LITTIE Peru, MD;  Location: [facility];  Service: Neurosurgery;  Laterality: N/A;  Lumbar Four-Five Anterior Lumbar Interbody Fusion with Instrumentation   BACK SURGERY     cervical spine---disk disease   CARPAL TUNNEL RELEASE     Bilateral   CESAREAN SECTION     CHOLECYSTECTOMY     CYSTOSCOPY/RETROGRADE/URETEROSCOPY/STONE EXTRACTION WITH BASKET Right 04/26/2014   Procedure: CYSTOSCOPY/ RIGHT RETROGRADE/ RIGHT URETEROSCOPY/URETERAL AND RENAL PELVIS BIOPSY;  Surgeon: Ricardo Likens, MD;  Location: [facility];  Service: Urology;  Laterality: Right;   ESOPHAGOGASTRODUODENOSCOPY (EGD) WITH PROPOFOL  N/A 08/25/2022   Procedure: ESOPHAGOGASTRODUODENOSCOPY (EGD) WITH PROPOFOL ;  Surgeon: Rollin Dover, MD;  Location: [facility];  Service: Gastroenterology;  Laterality: N/A;   gallstones removed     HEMOSTASIS CLIP PLACEMENT  08/25/2022   Procedure: HEMOSTASIS CLIP PLACEMENT;  Surgeon:  Rollin Dover, MD;  Location: [facility];  Service: Gastroenterology;;   HOT HEMOSTASIS N/A 08/25/2022   Procedure: HOT HEMOSTASIS (ARGON PLASMA COAGULATION/BICAP);  Surgeon: Rollin Dover, MD;  Location: [facility];  Service: Gastroenterology;  Laterality: N/A;   POLYPECTOMY  08/25/2022   Procedure: POLYPECTOMY;  Surgeon: Rollin Dover, MD;  Location: [facility];  Service: Gastroenterology;;   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   bilateral   right elbow surgery     RIGHT HEART CATH N/A 02/28/2021   Procedure: RIGHT HEART CATH;  Surgeon: Verlin Lonni BIRCH, MD;  Location: [facility];  Service: Cardiovascular;  Laterality: N/A;   THYROID  LOBECTOMY Right 02/27/2017   THYROID  LOBECTOMY Right 02/27/2017   Procedure:  RIGHT THYROID  LOBECTOMY;  Surgeon: Vanderbilt Ned, MD;  Location: [facility];  Service: General;  Laterality: Right;   TUBAL LIGATION      Social History:  reports that she has never smoked. She has never used smokeless tobacco. She reports that she does not currently use drugs after having used the following drugs: Cocaine. She reports that she does not drink alcohol.  Allergies: Allergies[1]  Facility-Administered Medications Prior to Admission  Medication Dose Route Frequency Provider Last Rate Last Admin   sodium chloride  flush (NS) 0.9 % injection 3 mL  3 mL Intravenous Q12H Chandrasekhar, [name] A, MD       Medications Prior to Admission  Medication Sig Dispense Refill   albuterol  (ACCUNEB ) 0.63 MG/3ML nebulizer solution Take 3 mLs (0.63 mg total) by nebulization every 6 (six) hours as needed for wheezing. 75 mL 12   albuterol  (VENTOLIN  HFA) 108 (90 Base) MCG/ACT inhaler INHALE TWO (2) PUFFS BY MOUTH EVERY 4 HOURS AS NEEDED FOR SHORTNESS OF BREATH 8.5 g 10   canagliflozin  (INVOKANA ) 100 MG TABS tablet TAKE 1 TABLET BY MOUTH DAILY BEFORE BREAKFAST *PATIENT MUST KEEP UPCOMING APPOINTMENT 02/2024 WITH CARDIOLOGIST FOR FURTHER REFILLS, FINAL ATTEMPT* 30 tablet 0   cloNIDine  (CATAPRES ) 0.1 MG tablet TAKE ONE (1) TABLET BY MOUTH TWICE DAILY BEFORE BREAKFAST AND AT BEDTIME 60 tablet 11   diltiazem  (CARDIZEM  CD) 240 MG 24 hr capsule TAKE 1 CAPSULE BY MOUTH DAILY BEFORE BREAKFAST (Patient taking differently: Take 240 mg by mouth daily.) 30 capsule 11   DULoxetine  (CYMBALTA ) 30 MG capsule Take 90 mg by mouth daily.     fexofenadine  (ALLEGRA ) 180 MG tablet Take 180 mg by mouth daily.     fluticasone  (FLONASE ) 50 MCG/ACT nasal spray Place 2 sprays into both nostrils in the morning and at bedtime.     furosemide  (LASIX ) 80 MG tablet Take 0.5 tablets (40 mg total) by mouth daily. 15 tablet 1   gabapentin  (NEURONTIN ) 300 MG capsule TAKE ONE (1) CAPSULE BY MOUTH TWICE DAILY 60 capsule 10   lidocaine   (LIDODERM ) 5 % APPLY 1 PATCH TOPICALLY ON THE SKIN DAILY AS NEEDED TO PAINFUL SITE *12 HOURS ON AND 12 HOURS OFF OR AS DIRECTED BY MD* 30 patch 11   LINZESS  145 MCG CAPS capsule Take 1 capsule (145 mcg total) by mouth every other day. 30 capsule 6   metFORMIN  (GLUCOPHAGE ) 500 MG tablet TAKE 1 TABLET BY MOUTH EVERY DAY AT BEDTIME 30 tablet 11   omeprazole  (PRILOSEC) 20 MG capsule TAKE 1 CAPSULE BY MOUTH TWICE DAILY BEFORE A MEAL 180 capsule 11   OZEMPIC , 2 MG/DOSE, 8 MG/3ML SOPN INJECT 2MG  SUBCUTANEOUSLY ONCE WEEKLY 9 mL 11   potassium chloride  (MICRO-K ) 10 MEQ CR capsule TAKE TWO (2) CAPSULES BY MOUTH EVERY  DAY AT BEDTIME 60 capsule 11   QUEtiapine  (SEROQUEL ) 100 MG tablet Take 150 mg by mouth at bedtime.     rosuvastatin  (CRESTOR ) 10 MG tablet TAKE 1 TABLET BY MOUTH DAILY 30 tablet 0   spironolactone  (ALDACTONE ) 25 MG tablet Take 0.5 tablets (12.5 mg total) by mouth daily. 7 tablet 0   SYMBICORT  160-4.5 MCG/ACT inhaler INHALE TWO (2) PUFFS BY MOUTH TWICE DAILY 10.2 g 11   warfarin (COUMADIN ) 10 MG tablet TAKE DAILY AS DIRECTED BY MD (Patient taking differently: Take 10 mg by mouth every Monday, Wednesday, and Friday.) 30 tablet 11   warfarin (COUMADIN ) 7.5 MG tablet Take 1 tablet (7.5 mg total) by mouth See admin instructions. Take 7.5mg  (1 tablet) by mouth on Sunday, Tuesday, Thursday, and Saturday. 18 tablet 11   zonisamide  (ZONEGRAN ) 100 MG capsule TAKE 1 CAPSULE BY MOUTH EACH MORNING AND TAKE 2 CAPSULES IN THE EVENING 90 capsule 11   acetaminophen  (TYLENOL ) 500 MG tablet Take 500-1,000 mg by mouth every 6 (six) hours as needed for moderate pain (pain score 4-6). (Patient not taking: Reported on 04/17/2024)     diclofenac  sodium (VOLTAREN ) 1 % GEL Apply 2 g topically daily as needed (fibromyalgia pain- affected sites). (Patient not taking: Reported on 04/17/2024)       Physical Exam: Blood pressure (!) 160/97, pulse 79, temperature (!) 100.8 F (38.2 C), temperature source Axillary, resp. rate  (!) 32, height 5' 6 (1.676 m), weight 127.8 kg, SpO2 99%. General: obese black female who is laying in bed in NAD, sedated HEENT: head is normocephalic, atraumatic, except EVD in place with serosanguinous output.  Sclera are noninjected.  PERRL.  Ears and nose without any masses or lesions.  Butterfly type rash around her eyes c/w lupus diagnosis.  Mouth with ETT in place. Heart: regular, rate, and rhythm.  Lungs: CTAB, no wheezes, rhonchi, or rales noted.  Respiratory effort nonlabored on vent Abd: soft, ND, but obese, +BS, no masses, hernias, or organomegaly.  Lower midline scar noted as well as lap chole scars. Neuro: withdrawals to painful stimuli on right side only. Psych: unable   Results for orders placed or performed during the hospital encounter of 04/16/24 (from the past 48 hours)  Glucose, capillary     Status: Abnormal   Collection Time: 04/21/24  3:51 PM  Result Value Ref Range   Glucose-Capillary 188 (H) 70 - 99 mg/dL    Comment: Glucose reference range applies only to samples taken after fasting for at least 8 hours.  Sodium     Status: Abnormal   Collection Time: 04/21/24  5:40 PM  Result Value Ref Range   Sodium 150 (H) 135 - 145 mmol/L    Comment: Performed at [facility], [address], [place], [place] [address]  Glucose, capillary     Status: Abnormal   Collection Time: 04/21/24  7:58 PM  Result Value Ref Range   Glucose-Capillary 171 (H) 70 - 99 mg/dL    Comment: Glucose reference range applies only to samples taken after fasting for at least 8 hours.  Sodium     Status: Abnormal   Collection Time: 04/21/24 11:35 PM  Result Value Ref Range   Sodium 153 (H) 135 - 145 mmol/L    Comment: Performed at [facility], [address], [place], [place] [address]  Glucose, capillary     Status: Abnormal   Collection Time: 04/21/24 11:38 PM  Result Value Ref Range   Glucose-Capillary 175 (  H) 70 - 99 mg/dL    Comment: Glucose reference range applies only to  samples taken after fasting for at least 8 hours.  Glucose, capillary     Status: Abnormal   Collection Time: 04/22/24  3:46 AM  Result Value Ref Range   Glucose-Capillary 160 (H) 70 - 99 mg/dL    Comment: Glucose reference range applies only to samples taken after fasting for at least 8 hours.  Basic metabolic panel     Status: Abnormal   Collection Time: 04/22/24  5:28 AM  Result Value Ref Range   Sodium 156 (H) 135 - 145 mmol/L   Potassium 3.7 3.5 - 5.1 mmol/L   Chloride 123 (H) 98 - 111 mmol/L   CO2 22 22 - 32 mmol/L   Glucose, Bld 179 (H) 70 - 99 mg/dL    Comment: Glucose reference range applies only to samples taken after fasting for at least 8 hours.   BUN 23 8 - 23 mg/dL   Creatinine, Ser 9.20 0.44 - 1.00 mg/dL   Calcium  9.0 8.9 - 10.3 mg/dL   GFR, Estimated >39 >39 mL/min    Comment: (NOTE) Calculated using the CKD-EPI Creatinine Equation (2021)    Anion gap 11 5 - 15    Comment: Performed at [facility], [address], [place], [place] [address]  CBC     Status: Abnormal   Collection Time: 04/22/24  5:28 AM  Result Value Ref Range   WBC 11.0 (H) 4.0 - 10.5 K/uL   RBC 3.96 3.87 - 5.11 MIL/uL   Hemoglobin 10.1 (L) 12.0 - 15.0 g/dL   HCT 67.7 (L) 63.9 - 53.9 %   MCV 81.3 80.0 - 100.0 fL   MCH 25.5 (L) 26.0 - 34.0 pg   MCHC 31.4 30.0 - 36.0 g/dL   RDW 81.6 (H) 88.4 - 84.4 %   Platelets 334 150 - 400 K/uL   nRBC 0.0 0.0 - 0.2 %    Comment: Performed at [facility], [address] [place], [place] [address]  Phosphorus     Status: Abnormal   Collection Time: 04/22/24  5:28 AM  Result Value Ref Range   Phosphorus 2.4 (L) 2.5 - 4.6 mg/dL    Comment: Performed at [facility], [address] [place], [place] [address]  Sodium     Status: Abnormal   Collection Time: 04/22/24  5:28 AM  Result Value Ref Range   Sodium 155 (H) 135 - 145 mmol/L    Comment: Performed at [facility], [address] [place], [place] [address]  Glucose, capillary      Status: Abnormal   Collection Time: 04/22/24  7:42 AM  Result Value Ref Range   Glucose-Capillary 170 (H) 70 - 99 mg/dL    Comment: Glucose reference range applies only to samples taken after fasting for at least 8 hours.  Glucose, capillary     Status: Abnormal   Collection Time: 04/22/24 11:42 AM  Result Value Ref Range   Glucose-Capillary 205 (H) 70 - 99 mg/dL    Comment: Glucose reference range applies only to samples taken after fasting for at least 8 hours.  Sodium     Status: Abnormal   Collection Time: 04/22/24 11:48 AM  Result Value Ref Range   Sodium 167 (HH) 135 - 145 mmol/L    Comment: Critical Value, Read Back and verified with R. FIERY, RN @ [phone] 04/22/24 BY [facility] Performed at [facility], [address] [place], [place] [address]   Sodium     Status: Abnormal   Collection Time: 04/22/24  1:12 PM  Result Value Ref Range   Sodium 158 (H) 135 - 145 mmol/L    Comment: DELTA CHECK Performed at [facility], [address], [place], [place] [address]   Glucose, capillary     Status: Abnormal   Collection Time: 04/22/24  3:50 PM  Result Value Ref Range   Glucose-Capillary 140 (H) 70 - 99 mg/dL    Comment: Glucose reference range applies only to samples taken after fasting for at least 8 hours.  Sodium     Status: Abnormal   Collection Time: 04/22/24  6:18 PM  Result Value Ref Range   Sodium 157 (H) 135 - 145 mmol/L    Comment: Performed at [facility], [address], [place], [place] [address]  Glucose, capillary     Status: Abnormal   Collection Time: 04/22/24  7:41 PM  Result Value Ref Range   Glucose-Capillary 181 (H) 70 - 99 mg/dL    Comment: Glucose reference range applies only to samples taken after fasting for at least 8 hours.  Sodium     Status: Abnormal   Collection Time: 04/22/24 11:18 PM  Result Value Ref Range   Sodium 155 (H) 135 - 145 mmol/L    Comment: Performed at [facility], [address], [place], [place]  [address]  Glucose, capillary     Status: Abnormal   Collection Time: 04/22/24 11:38 PM  Result Value Ref Range   Glucose-Capillary 180 (H) 70 - 99 mg/dL    Comment: Glucose reference range applies only to samples taken after fasting for at least 8 hours.  Glucose, capillary     Status: Abnormal   Collection Time: 04/23/24  3:35 AM  Result Value Ref Range   Glucose-Capillary 188 (H) 70 - 99 mg/dL    Comment: Glucose reference range applies only to samples taken after fasting for at least 8 hours.  Basic metabolic panel     Status: Abnormal   Collection Time: 04/23/24  5:31 AM  Result Value Ref Range   Sodium 155 (H) 135 - 145 mmol/L   Potassium 3.6 3.5 - 5.1 mmol/L   Chloride 122 (H) 98 - 111 mmol/L   CO2 22 22 - 32 mmol/L   Glucose, Bld 194 (H) 70 - 99 mg/dL    Comment: Glucose reference range applies only to samples taken after fasting for at least 8 hours.   BUN 21 8 - 23 mg/dL   Creatinine, Ser 9.19 0.44 - 1.00 mg/dL   Calcium  8.7 (L) 8.9 - 10.3 mg/dL   GFR, Estimated >39 >39 mL/min    Comment: (NOTE) Calculated using the CKD-EPI Creatinine Equation (2021)    Anion gap 11 5 - 15    Comment: Performed at [facility], [address], [place], [place] [address]  CBC     Status: Abnormal   Collection Time: 04/23/24  5:31 AM  Result Value Ref Range   WBC 8.9 4.0 - 10.5 K/uL   RBC 3.66 (L) 3.87 - 5.11 MIL/uL   Hemoglobin 9.3 (L) 12.0 - 15.0 g/dL   HCT 69.4 (L) 63.9 - 53.9 %   MCV 83.3 80.0 - 100.0 fL   MCH 25.4 (L) 26.0 - 34.0 pg   MCHC 30.5 30.0 - 36.0 g/dL   RDW 81.4 (H) 88.4 - 84.4 %  Platelets 295 150 - 400 K/uL   nRBC 0.2 0.0 - 0.2 %    Comment: Performed at [facility], [address], [place], [place] [address]  Glucose, capillary     Status: Abnormal   Collection Time: 04/23/24  7:36 AM  Result Value Ref Range   Glucose-Capillary 183 (H) 70 - 99 mg/dL    Comment: Glucose reference range applies only to samples taken after fasting for at least 8 hours.   Glucose, capillary     Status: Abnormal   Collection Time: 04/23/24 11:48 AM  Result Value Ref Range   Glucose-Capillary 179 (H) 70 - 99 mg/dL    Comment: Glucose reference range applies only to samples taken after fasting for at least 8 hours.   *Note: Due to a large number of results and/or encounters for the requested time period, some results have not been displayed. A complete set of results can be found in Results Review.   CT HEAD WO CONTRAST ( ) Result Date: 04/22/2024 EXAM: CT HEAD WITHOUT CONTRAST 04/22/2024 02:30:28 AM TECHNIQUE: CT of the head was performed without the administration of intravenous contrast. Automated exposure control, iterative reconstruction, and/or weight based adjustment of the mA/kV was utilized to reduce the radiation dose to as low as reasonably achievable. COMPARISON: 04/18/2024 CLINICAL HISTORY: Hemorrhagic stroke. FINDINGS: BRAIN AND VENTRICLES: Right frontal approach ventricular shunt catheter in unchanged position. Stable hemorrhage within right cerebral hemisphere. Stable subarachnoid hemorrhage along bilateral cerebral convexities and sylvian fissures. Stable edema and sulcal effacement throughout right cerebral hemisphere. Stable 5 mm right to left midline shift. Stable bilateral intraventricular hemorrhage. Stable asymmetric dilatation of left lateral ventricle. No evidence of acute infarct. No extra-axial collection. ORBITS: Bilateral lens replacements. SINUSES: No acute abnormality. SOFT TISSUES AND SKULL: No acute soft tissue abnormality. No skull fracture. IMPRESSION: 1. Stable hemorrhage within the right cerebral hemisphere with stable subarachnoid hemorrhage along the bilateral cerebral convexities and sylvian fissures, and stable bilateral intraventricular hemorrhage. 2. Stable edema and sulcal effacement throughout the right cerebral hemisphere with stable 5 mm right-to-left midline shift. 3. Right frontal approach ventricular shunt catheter in  unchanged position. 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  We will plan to move forward with bedside PEG tomorrow after trach placement.  D/w CCM.  We will stop TFs at 0400am for both procedures.  I have called daughter, Jon, who has been consenting for the patient.  I explained the procedure along with risks and complications to her.  She understands and agrees to proceed.  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  She had no questions for me.  I have discussed with OR who will have PEG equipment to bedside around 10am tomorrow.   FEN - hold TFs at 0400 on 2/5 VTE - SCDs ID - [id number]   I reviewed Consultant NSGY, CCM, stroke team notes, hospitalist notes, last 24 h vitals and pain scores, last 48 h intake and output, last 24 h labs and trends, and last 24 h imaging results.  Burnard FORBES Banter, [facility] 04/23/2024, 12:38 PM Please see Amion for pager number during day hours 7:00am-4:30pm or 7:00am -11:30am on weekends      [1]  Allergies Allergen  Reactions   Lisinopril  Anaphylaxis, Swelling and Other (See Comments)    THROAT SWELLING, Pt states her throat started to close up after being on it for so long.    Prednisone  Anaphylaxis, Swelling and Other (See Comments)    Throat swelling and tongue irritation   Spironolactone  Other (See Comments)    Caused stroke-like symptoms with the mouth   Corticosteroids Rash    States she developed a rash on her tongue after a steroid injection in her back   "

## 2024-04-23 NOTE — Progress Notes (Signed)
 STROKE TEAM PROGRESS NOTE   INTERVAL HISTORY Granddaughter and grandson are at bedside.  Pt slightly more responsive per RN, opened eyes briefly this morning per RN with intermittent showing thumb and wiggle toes. On CPAP during the day and flip back to vent at night. CCM Dr [name] talked with family and plan for trach and PEG tomorrow. Palliative care also on board.   Vitals:   04/23/24 1200 04/23/24 1300 04/23/24 1455 04/23/24 1550  BP: (!) 148/93 (!) 159/100 (!) 157/94   Pulse: 77 77 81   Resp: (!) 31 (!) 31 (!) 33   Temp: (!) 100.8 F (38.2 C)   100.3 F (37.9 C)  TempSrc: Axillary   Oral  SpO2: 99% 99% 99%   Weight:      Height:       CBC:  Recent Labs  Lab 04/22/24 0528 04/23/24 0531  WBC 11.0* 8.9  HGB 10.1* 9.3*  HCT 32.2* 30.5*  MCV 81.3 83.3  PLT 334 295   Basic Metabolic Panel:  Recent Labs  Lab 04/20/24 0509 04/20/24 1148 04/21/24 0457 04/21/24 1052 04/22/24 0528 04/22/24 1148 04/23/24 0531 04/23/24 1148  NA 152*   < > 151*   < > 156*  155*   < > 155* 153*  K 3.3*  --  3.8  --  3.7  --  3.6  --   CL 117*  --  115*  --  123*  --  122*  --   CO2 24  --  22  --  22  --  22  --   GLUCOSE 169*  --  211*  --  179*  --  194*  --   BUN 15  --  18  --  23  --  21  --   CREATININE 0.75  --  0.80  --  0.79  --  0.80  --   CALCIUM  9.1  --  9.2  --  9.0  --  8.7*  --   MG 2.0  --  1.9  --   --   --   --   --   PHOS 2.4*  --  2.9  --  2.4*  --   --  2.1*   < > = values in this interval not displayed.   Lipid Panel:  Recent Labs  Lab 04/19/24 0427 04/21/24 0457  CHOL 169  --   TRIG 151* 233*  HDL 60  --   CHOLHDL 2.8  --   VLDL 30  --   LDLCALC 79  --    Urine Drug Screen:  Recent Labs  Lab 04/18/24 2330  LABOPIA NEGATIVE  COCAINSCRNUR NEGATIVE  LABBENZ POSITIVE*  AMPHETMU NEGATIVE  THCU NEGATIVE  LABBARB NEGATIVE    Alcohol Level  Recent Labs  Lab 04/16/24 2207  ETH <15    IMAGING past 24 hours No results found.    PHYSICAL  EXAM General - Well nourished, well developed, intubated off sedation.  Ophthalmologic - fundi not visualized due to noncooperation. Cardiovascular - Regular rate and rhythm. Respiratory- CPAP support on ventilator.  Neuro -  Pt is intubated off sedation, eyes closed, showed R thumb on request but no other commands. With forced eye opening, eyes in mid position, not blinking to visual threat, doll's eyes absent, not tracking, pupils 2mm, sluggish to light. Corneal reflex present weakly on the R, gag and cough present. Breathing over the vent, on CPAP with PS.  Facial symmetry not able to  test due to ET tube.  Tongue protrusion not cooperative. On pain stimulation, localizing with RUE and barely against gravity. Slight withdraw BLEs and no movement of LUE, intermittent R toe movement spontaneously. Sensation, coordination and gait not tested.   ASSESSMENT/PLAN [name] is a 69 y.o. 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 CT showed R brain IPH w/ IVH and developing hydrocephalus. Warfarin reversed. EVD placed following worsening hydrocephalus.    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 Hydrocephalus.  CTA head & neck worsening R lateral IVH. No LVO or stenoses.  CT head 1/29 worsening R IVH w/ ventricular trapping, new basilar SAH w/ trace SAH L brain. 2mm R to L midline shift CT head 1/29 0850 decreasing hydrocephalus, R frontal EVD, 2mm midline shift remains Repeat CT head 1/30 No significant interval change in appearance of bilateral intraventricular hemorrhage, primarily within the right lateral ventricle, with blood again demonstrated  within the third and fourth ventricles. Unchanged 5 mm right to left midline shift. Similar degree of hydrocephalus MRI w/wo Large intraventricular/periventricular hemorrhage, relatively stable, approximately 4mm of right-to-left shift. Few patchy foci of restricted diffusion about the falx and within the periventricular and peripheral right cerebral hemisphere, consistent with small acute ischemic infarcts. Right frontal approach EVD in place with tip in the left lateral ventricle. Stable ventricular size and morphology without worsened hydrocephalus. Small volume subarachnoid hemorrhage about the basilar cisterns,stable. CT repeat 2/3 Stable hemorrhage within the right cerebral hemisphere with stable SAH along the bilateral cerebral convexities and sylvian fissures, and stable bilateral intraventricular hemorrhage. Stable edema and sulcal effacement throughout the right cerebral hemisphere with stable 5 mm right-to-left midline shift. 2D Echo 07/2023 EF 60-65%. No source of embolus LDL 79 A1C 6.9 VTE prophylaxis - lovenox   warfarin daily prior to admission, now on No antithrombotic given ICH Therapy recommendations:  pending  Disposition:  pending, family would like trach and PEG tomorrow    Cerebral Edema Acute Hydrocephalus EVD placed 1/29 (CNSA) EVD drainage has improved overnight 3% stopped 1/30-> NS @ 40->off->3% @ 50->NS @ 50 23.4% bolus x 1 on 2/2 Na 135->146->145->147->154->155-155-154-155-153-152-159--152-151-156--153 Repeat Na q6h Allow Na gradually trending down  Positive lupus Anticoagulant Hx DVT On chronic warfarin, reversed This admission INR 3.1->Kcentra ->1.2 Hx UGIB w/ polyp removal 08/2022;  admission 07/2023 w/ supratherapeutic INR and anemia  Hypoxic Respiratory Failure Intubated 1/29 d/t neuro decline CCM on board On SBT Off precedex  Not candidate for extubation due to mental status Tmax 100.4->100.6->100.7 Leukocytosis WBC 11.1--9.2--9.3--8.9 Sputum culture  MRSA Now on Rocephin ->Ancef ->zyvox  D/c seroquel  150mg  daily Family pursue trach and PEG  Hypertensive Emergency Home meds:  clonidine  0.1 bid, diltiazem  cd 240, lasix  40 high on arrival  Cleviprex  gtt off On clonidine  0.3 tid and cozaar  100 on amlodipine  10 and coreg  12.5->25 bid Add hydralazine  50 Q8h SBP Broker < 160  Hyperlipidemia Home meds:  crestor  10mg  LDL 79, Dowell < 70 Hold statin in setting of IPH Consider statin at discharge  Diabetes type II controlled Home meds:  metformin , ozempic  HgbA1c 6.9, at [place] < 7.0 CBGs SSI Outpt PCP follow up for DM control  Dysphagia Secondary to stroke NPO Speech on board CorTrak in place On  TF @ 45  Family pursue PEG  Other Stroke Risk Factors Advanced Age >/= 38  Remote hx cocaine use (quit 2003, reab 2005). UDS positive this admission for benzos and fentanyl  (given inpatient) Morbid Obesity, Body mass index is 45.48 kg/m. 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 This patient's care requires constant monitoring of vital signs, hemodynamics, respiratory and cardiac monitoring, review of multiple databases, neurological assessment, discussion with family, other specialists and medical decision making of high  complexity. I spent 40 minutes of neurocritical care time in the care of this patient. I had long discussion with granddaughter and grandson at bedside, updated pt current condition, treatment plan and potential prognosis, and answered all the questions. They expressed understanding and appreciation. Discussed with Dr. Harold CCM

## 2024-04-23 NOTE — Consult Note (Signed)
 "                              Consultation Note Date: 04/23/2024   Patient Name: [name]  DOB: 10/25/1955  MRN: [id number]  Age / Sex: 69 y.o., female  PCP: Colleen Clotilda SAUNDERS, Franklin Referring Physician: Stroke, Md, Franklin  Reason for Consultation: Establishing goals of care  HPI/Patient Profile: 69 y.o. 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   Upon arrival to the ED, patient became increasingly somnolent. STAT CT head demonstrated 7mm R IC hemorrhage with extension into the ventricles with developing moderate hydrocephalus.  She had EVD placed at bedside by neurosurgery.  Hospital course complicated by acute hypoxic respiratory failure secondary to pneumonia, worsening hemorrhage with SAH and 2 mm mass effect.  Unfortunately, mental status has remained quite poor despite coming off of sedation.  PMT has been consulted day 5 to assist with goals of care conversation.  Clinical Assessment and Goals of Care:  I have reviewed medical records including EPIC notes, labs and imaging, discussed with multidisciplinary care team, assessed the patient and then had a phone conversation with patient's daughter Jon to discuss diagnosis prognosis, GOC, EOL wishes, disposition and options.  I introduced Palliative Medicine as specialized medical care for people living with serious illness. It focuses on providing relief from the symptoms and stress of a serious illness. The Mcmann is to improve quality of life for both the patient and the family.  We discussed a brief life review of the patient and then focused on their current illness.    I attempted to elicit values and goals of care important to the patient.    Medical History Review and  Understanding:  We discussed patient's acute illness in the context of their chronic comorbidities.  Social History: Patient is divorced.  She has 1 daughter and 2 sons.  She is well supported by her siblings and friends.  She enjoys communicating with her grandchildren and great-grandchildren, attending their school events, sharing Bible scripture, watching TV.  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  Daughter reports that she used drugs in the 90s and then participated in a sobriety program, which she is very proud of.  She is a strong Curator.  Previously attended [employer].  Functional and Nutritional State: Patient was living alone with an intermittent aide for personal care such as bathing, dressing, toileting, and housekeeping.  Albumin  at 4.4 on 1/28.  Discussion: After my phone call with patient's daughter, she stated that she has left the bedside for a little while due to rest and will be back at 5 PM.  She is agreeable to starting goals of care discussion by phone.  She initially was hesitant about palliative care, though reassured after her aunt explained the difference between palliative care and hospice.  She stated no no no immediately that hospice philosophy is not aligned with goals of care at this time. Patient values her independence more than anything else.  She is described as strong and hardheaded, having refused to move in with her daughter in the past and wanting to stay in her  own home every night.  Jon explained that she would not even want to stay overnight for family vacations and that they would drive her back to her own home after celebrations and reunions.  She would never find it acceptable to be in a long-term care facility. I attempted to explore what conversations have been held with patient's medical team about the anticipations for best case and worst-case  scenario outcomes, as well as most likely outcome.  She shares that she hopes for patient to get back to herself, laughing and talking with her friends and family. Jon has some experience working with disabled children who often have G-tubes.  She has never navigated insurance coverage and placement complications that come with this.  I provided education and counseling on expectations for transitioning from hospital to rehab and very likely long-term care.  This included insurance coverage, possible barriers and costs.  She reports that patient has Medicaid.  They have never had goals of care discussions previously about patient's care preferences and wishes.  She is agreeable to meeting in person tomorrow morning before procedures to discuss more details about the realistic recovery process and potential for complications including bedsores, recurrent infections or hospitalizations, worsening quality of life.   Discussed the importance of continued conversation with family and the medical providers regarding overall plan of care and treatment options, ensuring decisions are within the context of the patients values and GOCs.   Questions and concerns were addressed. The family was encouraged to call with questions or concerns.  PMT will continue to support holistically.   SUMMARY OF RECOMMENDATIONS   - Continue full code/full scope treatment - Patient's family are hopeful for return to her previous baseline and agreeable to trach and PEG tomorrow - Ongoing goals of care discussions.  Will meet with patient's daughter in person tomorrow morning prior to procedures. Important to note that she would never want long-term facility placement - Reached out to [facility] for assistance educating family on placement options and how trach/PEG may impact this - PMT will continue to follow and support  Prognosis:  poor  Discharge Planning: To Be Determined      Primary Diagnoses: Present on  Admission: **None**  Physical Exam Vitals and nursing note reviewed.  Constitutional:      General: She is not in acute distress.    Appearance: She is ill-appearing.     Interventions: She is intubated.  HENT:     Head: Normocephalic.  Cardiovascular:     Rate and Rhythm: Normal rate.  Pulmonary:     Effort: Tachypnea present. She is intubated.  Neurological:     Mental Status: She is unresponsive.  Psychiatric:        Speech: She is noncommunicative.        Cognition and Memory: Cognition is impaired.    Vital Signs: BP (!) 165/105   Pulse 82   Temp 100.3 F (37.9 C) (Axillary)   Resp (!) 32   Ht 5' 6 (1.676 m)   Wt 127.8 kg   SpO2 100%   BMI 45.48 kg/m  Pain Scale: CPOT   Pain Score: Asleep   SpO2: SpO2: 100 % O2 Device:SpO2: 100 % O2 Flow Rate: .    Palliative Assessment/Data: 10%    Brynlie Daza P Analia Zuk, PA-C  Palliative Medicine Team Team phone # [phone]  Thank you for allowing the Palliative Medicine Team to assist in the care of this patient. Please utilize secure chat with additional questions, if there is  no response within 30 minutes please call the above phone number.  Palliative Medicine Team providers are available by phone from 7am to 7pm daily and can be reached through the team cell phone.  Should this patient require assistance outside of these hours, please call the patient's attending physician.   I personally spent a total of 75 minutes in the care of the patient today including preparing to see the patient, getting/reviewing separately obtained history, performing a medically appropriate exam/evaluation, counseling and educating, referring and communicating with other health care professionals, and documenting clinical information in the EHR.  "

## 2024-04-23 NOTE — Procedures (Signed)
 VENTILATOR WEAN NOTE 04/23/2024  Start Mode: PRVC  Wean Mode: Pressure Support  Duration before failure: Patient is still on CPAP / PS 8/5  Reason for failure: Patient is still on CPAP / PS 8/5  Notes: Patient was placed on CPAP / PS 12/5 at 07:35 this AM. Pressure support was decreased to 8 by CCM. Patient continues to wean on 8/5 at this time.

## 2024-04-23 NOTE — Progress Notes (Signed)
 Patient ID: [name], female   DOB: [birth year], 69 y.o.   MRN: [id number] BP (!) 157/94   Pulse 81   Temp 100.3 F (37.9 C) (Oral)   Resp (!) 33   Ht 5' 6 (1.676 m)   Wt 127.8 kg   SpO2 99%   BMI 45.48 kg/m  [name] family has opted for maximal treatment. Tomorrow she is scheduled for a tracheostomy creation, and a peg tube. No neurological change.

## 2024-04-24 ENCOUNTER — Telehealth: Payer: Self-pay | Admitting: Internal Medicine

## 2024-04-24 ENCOUNTER — Telehealth: Payer: Self-pay | Admitting: Family Medicine

## 2024-04-24 DIAGNOSIS — J15212 Pneumonia due to Methicillin resistant Staphylococcus aureus: Secondary | ICD-10-CM | POA: Diagnosis not present

## 2024-04-24 DIAGNOSIS — G4733 Obstructive sleep apnea (adult) (pediatric): Secondary | ICD-10-CM | POA: Diagnosis not present

## 2024-04-24 DIAGNOSIS — J690 Pneumonitis due to inhalation of food and vomit: Secondary | ICD-10-CM | POA: Diagnosis not present

## 2024-04-24 DIAGNOSIS — I82402 Acute embolism and thrombosis of unspecified deep veins of left lower extremity: Secondary | ICD-10-CM | POA: Diagnosis not present

## 2024-04-24 DIAGNOSIS — J9601 Acute respiratory failure with hypoxia: Secondary | ICD-10-CM | POA: Diagnosis not present

## 2024-04-24 DIAGNOSIS — D649 Anemia, unspecified: Secondary | ICD-10-CM | POA: Diagnosis not present

## 2024-04-24 DIAGNOSIS — I615 Nontraumatic intracerebral hemorrhage, intraventricular: Secondary | ICD-10-CM | POA: Diagnosis not present

## 2024-04-24 DIAGNOSIS — I82401 Acute embolism and thrombosis of unspecified deep veins of right lower extremity: Secondary | ICD-10-CM | POA: Diagnosis not present

## 2024-04-24 DIAGNOSIS — G935 Compression of brain: Secondary | ICD-10-CM

## 2024-04-24 DIAGNOSIS — G911 Obstructive hydrocephalus: Secondary | ICD-10-CM | POA: Diagnosis not present

## 2024-04-24 DIAGNOSIS — G936 Cerebral edema: Secondary | ICD-10-CM | POA: Diagnosis not present

## 2024-04-24 DIAGNOSIS — J45909 Unspecified asthma, uncomplicated: Secondary | ICD-10-CM | POA: Diagnosis not present

## 2024-04-24 LAB — BASIC METABOLIC PANEL WITH GFR
Anion gap: 10 (ref 5–15)
BUN: 18 mg/dL (ref 8–23)
CO2: 21 mmol/L — ABNORMAL LOW (ref 22–32)
Calcium: 8.1 mg/dL — ABNORMAL LOW (ref 8.9–10.3)
Chloride: 120 mmol/L — ABNORMAL HIGH (ref 98–111)
Creatinine, Ser: 0.71 mg/dL (ref 0.44–1.00)
GFR, Estimated: >60 mL/min
Glucose, Bld: 140 mg/dL — ABNORMAL HIGH (ref 70–99)
Potassium: 3.5 mmol/L (ref 3.5–5.1)
Sodium: 152 mmol/L — ABNORMAL HIGH (ref 135–145)

## 2024-04-24 LAB — GLUCOSE, CAPILLARY
Glucose-Capillary: 126 mg/dL — ABNORMAL HIGH (ref 70–99)
Glucose-Capillary: 126 mg/dL — ABNORMAL HIGH (ref 70–99)
Glucose-Capillary: 130 mg/dL — ABNORMAL HIGH (ref 70–99)
Glucose-Capillary: 134 mg/dL — ABNORMAL HIGH (ref 70–99)
Glucose-Capillary: 180 mg/dL — ABNORMAL HIGH (ref 70–99)
Glucose-Capillary: 191 mg/dL — ABNORMAL HIGH (ref 70–99)

## 2024-04-24 LAB — MAGNESIUM: Magnesium: 2.2 mg/dL (ref 1.7–2.4)

## 2024-04-24 LAB — PHOSPHORUS: Phosphorus: 2.8 mg/dL (ref 2.5–4.6)

## 2024-04-24 LAB — SODIUM
Sodium: 151 mmol/L — ABNORMAL HIGH (ref 135–145)
Sodium: 149 mmol/L — ABNORMAL HIGH (ref 135–145)

## 2024-04-24 MED ORDER — OSMOLITE 1.5 CAL PO LIQD
1000.0000 mL | ORAL | Status: AC
  Administered 2024-04-24 – 2024-04-25 (×2): 1000 mL

## 2024-04-24 MED ORDER — FENTANYL CITRATE (PF) 50 MCG/ML IJ SOSY
50.0000 ug | PREFILLED_SYRINGE | Freq: Once | INTRAMUSCULAR | Status: AC
  Administered 2024-04-24: 50 ug via INTRAVENOUS
  Filled 2024-04-24: qty 1

## 2024-04-24 MED ORDER — VECURONIUM BROMIDE 10 MG IV SOLR
10.0000 mg | Freq: Once | INTRAVENOUS | Status: AC
  Administered 2024-04-24: 10 mg via INTRAVENOUS
  Filled 2024-04-24: qty 10

## 2024-04-24 MED ORDER — ROSUVASTATIN CALCIUM 5 MG PO TABS
10.0000 mg | ORAL_TABLET | Freq: Every day | ORAL | Status: AC
  Administered 2024-04-25: 10 mg
  Filled 2024-04-24: qty 2

## 2024-04-24 MED ORDER — MIDAZOLAM HCL (PF) 2 MG/2ML IJ SOLN
2.0000 mg | Freq: Once | INTRAMUSCULAR | Status: AC
  Administered 2024-04-24: 2 mg via INTRAVENOUS
  Filled 2024-04-24: qty 2

## 2024-04-24 NOTE — Op Note (Signed)
" °*   No surgery found *  10:30 AM  PATIENT:  [name]  69 y.o. female  PRE-OPERATIVE DIAGNOSIS: Stroke, need for enteral access  POST-OPERATIVE DIAGNOSIS: Stroke, need for enteral access  PROCEDURE: Percutaneous endoscopic gastrostomy tube placement  SURGEON: Dann Hummer, MD  ASSISTANTS: Burnard Banter, PA-C  ANESTHESIA:   Sedation  EBL:  Total I/O In: 149.2 [I.V.:149.2] Out: 35 [Drains:35]  BLOOD ADMINISTERED:none  DRAINS: Gastrostomy Tube   SPECIMEN:  No Specimen  DISPOSITION OF SPECIMEN:  N/A  COUNTS:  YES  DICTATION: .Dragon Dictation Procedure in detail: Informed consent was obtained from her family.  She is receiving intravenous antibiotics.  She is in her room and continuously monitored in the ICU on the ventilator.  We did a timeout procedure.  She received sedation and muscle relaxation as well as pain medication.  I placed a bite block in her mouth.  I advanced the EGD scope into her esophagus.  There were no gross lesions.  The scope was advanced down into her stomach and the stomach was insufflated.  I then passed the scope into the first portion of the duodenum and there were no abnormalities.  The scope was withdrawn back into the stomach and was further insufflated.  An easy poke site was identified.  The abdomen was then prepped and draped in a sterile fashion.  Local was injected at the poke site and a small incision was made.  The Angiocath was inserted followed by the guidewire under direct vision.  This was grasped with the endoscopic snare and the guidewire was brought out through her mouth.  We then removed the cortrak.  The PEG tube was attached to the wire and the PEG tube was brought out through the abdominal wall.  The scope was reinserted down into the stomach.  The PEG tube was adjusted to appropriate position and the flange was placed.  Skin marking was 6.5.  The stomach was evacuated.  Triple antibiotic ointment was placed at the PEG site.   We will apply a binder.  All counts were correct.  No apparent complications.  She remained in the ICU in critical condition. PATIENT DISPOSITION:  ICU - intubated and critically ill.   Delay start of Pharmacological VTE agent (>24hrs) due to surgical blood loss or risk of bleeding:  no  Dann Hummer, MD, MPH, FACS Pager: [phone]  2/5/202610:30 AM              "

## 2024-04-24 NOTE — Progress Notes (Signed)
 Patient ID: [name], female   DOB: 02-Sep-1955, 69 y.o.   MRN: [id number] BP 123/86   Pulse 76   Temp (!) 100.5 F (38.1 C) (Axillary)   Resp (!) 26   Ht 5' 6 (1.676 m)   Wt 127.5 kg   SpO2 100%   BMI 45.37 kg/m  Intubated and sedated, not following commands. Today's events noted. Ventricular catheter draining well. The csf is far too bloody to contemplate a shunt at this point. The blood and breakdown products will clog the valve rendering the shunt useless. Will have to wait until the csf clears.

## 2024-04-24 NOTE — Telephone Encounter (Signed)
" °*  STAT* If patient is at the pharmacy, call can be transferred to refill team.   1. Which medications need to be refilled? (please list name of each medication and dose if known)   furosemide  (LASIX ) 80 MG tablet     2. Would you like to learn more about the convenience, safety, & potential cost savings by using the [facility]? No    3. Are you open to using the [facility] (Type [facility]. No    4. Which pharmacy/location (including street and city if local pharmacy) is medication to be sent to?[facility] [place]  - [place], [place] - [address]    5. Do they need a 30 day or 90 day supply? 90 day   "

## 2024-04-24 NOTE — Telephone Encounter (Signed)
 Copied from CRM [id number]. Topic: General - Other >> Apr 24, 2024 12:39 PM [name] wrote: Reason for CRM: Patients daughter, requests a urgent call back from Dr. Mercer concerning the patient, states the patient is in the hospital on the ventilator , and its very important she speak to someone , she needs set up on patients mychart.

## 2024-04-24 NOTE — Progress Notes (Signed)
 Nutrition Follow-up  DOCUMENTATION CODES:   Morbid obesity  INTERVENTION:   Continue tube feeding via PEG tube: Osmolite 1.5 at 45 ml/h (1080 ml per day)  Prosource TF20 60 ml daily  Provides 1700 kcal, 87 gm protein, 822 ml free water  daily   NUTRITION DIAGNOSIS:   Inadequate oral intake related to inability to eat as evidenced by NPO status. Ongoing   Rodgers:   Patient will meet greater than or equal to 90% of their needs Met with TF at [place] rate   MONITOR:   Vent status  REASON FOR ASSESSMENT:   Ventilator    ASSESSMENT:   Pt with hx of HLD, HTN, diabetes, OSA on CPAP, COPD, and lupus. Admitted w/ c/o of headache and n/v, found to have R ICH but mentation worsened and repeat scan showed worsening bleed with new SAH and 2 mm midline shift.  Pt discussed during ICU rounds and with RN and MD.  Pt had trach and PEG placed today at bedside.  Per MD neuro exam barrier to extubation     1/29 - admitted s/p EVD placement and intubation  1/30 - s/p cortrak placement; tip gastric  1/31 - off all sedation  2/4 - Palliative care consulted 2/5 - s/p trach and PEG   EVD 130 ml  UOP 1400 ml   Medications reviewed and include: SSI every 4 hours, protonix , miralax , senna IV NS @ 50 ml/hr 2/2 - pt received 4 doses of 20 g lactulose  until BM  Labs reviewed:  Na 149  Refeeding Labs: Recent Labs  Lab 04/20/24 0509 04/21/24 0457 04/22/24 0528 04/23/24 0531 04/23/24 1148 04/24/24 0605  K 3.3* 3.8 3.7 3.6  --  3.5  MG 2.0 1.9  --   --   --  2.2  PHOS 2.4* 2.9 2.4*  --  2.1* 2.8   Glucose Profile:  Recent Labs    04/24/24 0330 04/24/24 0718 04/24/24 1116  GLUCAP 126* 126* 130*     Diet Order:   Diet Order             Diet NPO time specified  Diet effective now                   EDUCATION NEEDS:   No education needs have been identified at this time  Skin:  Skin Assessment: Reviewed RN Assessment  Last BM:  150 ml via pouch (was given 4  doses of 20 g lactulose  until bm)  Height:   Ht Readings from Last 1 Encounters:  04/22/24 5' 6 (1.676 m)    Weight:   Wt Readings from Last 1 Encounters:  04/24/24 127.5 kg    Ideal Body Weight:  59.1 kg  BMI:  Body mass index is 45.37 kg/m.  Estimated Nutritional Needs:   Kcal:  1600-1800  Protein:  90-110g  Fluid:  >1.6 L/day  Powell SQUIBB., RD, LDN, CNSC See AMiON for contact information

## 2024-04-24 NOTE — Procedures (Signed)
 Percutaneous Tracheostomy Procedure Note   [name]  [id number]  May 16, 1955  Date:04/24/24  Time:3:32 PM   Provider Performing:Vandy Fong  Procedure: Percutaneous Tracheostomy with Bronchoscopic Guidance (68399)  Indication(s) Acute respiratory failure  Consent Risks of the procedure as well as the alternatives and risks of each were explained to the patient and/or caregiver.  Consent for the procedure was obtained.  Anesthesia Etomidate , Versed , Fentanyl , Rocuronium    Time Out Verified patient identification, verified procedure, site/side was marked, verified correct patient position, special equipment/implants available, medications/allergies/relevant history reviewed, required imaging and test results available.   Sterile Technique Maximal sterile technique including sterile barrier drape, hand hygiene, sterile gown, sterile gloves, mask, hair covering.    Procedure Description Appropriate anatomy identified by palpation.  Patient's neck prepped and draped in sterile fashion.  1% lidocaine  with epinephrine  was used to anesthetize skin overlying neck.  1.5cm incision made and blunt dissection performed until tracheal rings could not be easily palpated, due to her short neck and body habitus, after few attempts, decision was made to abort the procedure and have ENT do trach in OR.   Complications/Tolerance None; patient tolerated the procedure well. Chest X-ray is ordered to confirm no post-procedural complication.   EBL Minimal   Specimen(s) None

## 2024-04-24 NOTE — Progress Notes (Signed)
 "                                                                                                                                                        Daily Progress Note   Patient Name: [name]       Date: 04/24/2024 DOB: April 01, 1955  Age: 69 y.o. MRN#: [id number] Attending Physician: Stroke, Md, MD Primary Care Physician: Mercer Clotilda SAUNDERS, MD Admit Date: 04/16/2024  Reason for Consultation/Follow-up: Establishing goals of care  Subjective: Patient assessed at the bedside. She gave a thumbs up and wiggled her toes in response to daughter's request. Patient's sister available by speaker phone. Discussed with RN.  Created space and opportunity for family's thoughts and feelings on patient's current illness. They shared their strong faith and hope for further improvement with additional time and support. Neville leans on her 40 years of experience in healthcare to assist Jon with medical decision making.  Provided education and counseling on the risks for complications such as recurrent infections and hospitalizations, bed sores, and prolonged time away from her home, which is most important to her. Explored their thoughts on what Mae would say if she was able to share her feelings. They both agreed that she would feel life is life despite her preference to be home.   Discussed upcoming trach/PEG and how this impacts placement options. They are very committed to placement at [facility], understanding the facility's ability to manage similar patients. I advised against additional interventions, such as HD if her kidneys developed complications, given this would nearly guarantee her needing an out of state facility. This would not be acceptable to family.  Discussed Marilyn's concern about nursing putting her back brace on the floor, providing patient experience contact information.   Questions and concerns addressed. PMT will continue to support holistically.   Physical Exam Vitals  and nursing note reviewed.  Constitutional:      General: She is not in acute distress.    Appearance: She is ill-appearing.     Interventions: She is intubated.  Cardiovascular:     Rate and Rhythm: Normal rate.  Pulmonary:     Effort: She is intubated.  Skin:    General: Skin is warm and dry.  Neurological:     Motor: Weakness present.  Psychiatric:        Speech: She is noncommunicative.            Vital Signs: BP (!) 144/96   Pulse 75   Temp 100.3 F (37.9 C) (Axillary)   Resp (!) 21   Ht 5' 6 (1.676 m)   Wt 127.5 kg   SpO2 100%   BMI 45.37 kg/m  SpO2: SpO2: 100 % O2 Device: O2 Device: Ventilator O2 Flow Rate:        Palliative Assessment/Data: 10%  Palliative Care Assessment & Plan   Patient Profile: 69 y.o. 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    Upon arrival to the ED, patient became increasingly somnolent. STAT CT head demonstrated 7mm R IC hemorrhage with extension into the ventricles with developing moderate hydrocephalus.  She had EVD placed at bedside by neurosurgery.  Hospital course complicated by acute hypoxic respiratory failure secondary to pneumonia, worsening hemorrhage with SAH and 2 mm mass effect.  Unfortunately, mental status has remained quite poor despite coming off of sedation.   PMT has been consulted day 5 to assist with goals of care conversation. Patient's daughter would like to proceed with trach/PEG/any necessary life-prolonging measures.  Assessment: Goals of care conversation Acute ICH, IVH, SAH, hydrocephalus   Recommendations/Plan: Continue full code/full scope treatment Proceed with trach and PEG per PCCM and surgery Family is committed to life-prolonging measures and strong  in their faith Psychosocial and emotional support provided PMT will see again for support on 2/9. Please call team line if urgent needs arise in the interim    Prognosis:  Poor  Discharge Planning: Family wants [facility]  Care plan was discussed with patient, RN, patient's daughter and sister, Dr. Jerri Mickle SHAUNNA Wonda, PA-C  Palliative Medicine Team Team phone # [phone]  Thank you for allowing the Palliative Medicine Team to assist in the care of this patient. Please utilize secure chat with additional questions, if there is no response within 30 minutes please call the above phone number.  Palliative Medicine Team providers are available by phone from 7am to 7pm daily and can be reached through the team cell phone.  Should this patient require assistance outside of these hours, please call the patient's attending physician.   I personally spent a total of 50 minutes in the care of the patient today including preparing to see the patient, getting/reviewing separately obtained history, performing a medically appropriate exam/evaluation, counseling and educating, referring and communicating with other health care professionals, and documenting clinical information in the EHR.  "

## 2024-04-24 NOTE — Telephone Encounter (Signed)
 Called and spoke with patient daughter Per DPR, she is aware that we would not be able to add her to the patient's Mychart due to the patient being unable to give consent at this time

## 2024-04-24 NOTE — Progress Notes (Signed)
 STROKE TEAM PROGRESS NOTE   INTERVAL HISTORY Granddaughter is at bedside.  Pt still not open eyes but seems squeezing hand on commands, did not follow other commands for me although RN reported showing thumb and wiggle toes. EVD patent. Had PEG with trauma team today and pending trach.    Vitals:   04/24/24 1910 04/24/24 1930 04/24/24 2000 04/24/24 2034  BP:  123/81  123/81  Pulse: 79 77 80 80  Resp: (!) 29 (!) 30 (!) 32 (!) 31  Temp:   (!) 100.6 F (38.1 C)   TempSrc:   Axillary   SpO2: 100% 100% 100% 100%  Weight:      Height:       CBC:  Recent Labs  Lab 04/22/24 0528 04/23/24 0531  WBC 11.0* 8.9  HGB 10.1* 9.3*  HCT 32.2* 30.5*  MCV 81.3 83.3  PLT 334 295   Basic Metabolic Panel:  Recent Labs  Lab 04/21/24 0457 04/21/24 1052 04/23/24 0531 04/23/24 1148 04/23/24 1756 04/24/24 0605 04/24/24 1143  NA 151*   < > 155* 153*   < > 152* 149*  K 3.8   < > 3.6  --   --  3.5  --   CL 115*   < > 122*  --   --  120*  --   CO2 22   < > 22  --   --  21*  --   GLUCOSE 211*   < > 194*  --   --  140*  --   BUN 18   < > 21  --   --  18  --   CREATININE 0.80   < > 0.80  --   --  0.71  --   CALCIUM  9.2   < > 8.7*  --   --  8.1*  --   MG 1.9  --   --   --   --  2.2  --   PHOS 2.9   < >  --  2.1*  --  2.8  --    < > = values in this interval not displayed.   Lipid Panel:  Recent Labs  Lab 04/19/24 0427 04/21/24 0457  CHOL 169  --   TRIG 151* 233*  HDL 60  --   CHOLHDL 2.8  --   VLDL 30  --   LDLCALC 79  --    Urine Drug Screen:  Recent Labs  Lab 04/18/24 2330  LABOPIA NEGATIVE  COCAINSCRNUR NEGATIVE  LABBENZ POSITIVE*  AMPHETMU NEGATIVE  THCU NEGATIVE  LABBARB NEGATIVE    Alcohol Level  No results for input(s): ETH in the last 168 hours.   IMAGING past 24 hours No results found.    PHYSICAL EXAM General - Well nourished, well developed, intubated off sedation.  Ophthalmologic - fundi not visualized due to noncooperation. Cardiovascular - Regular  rate and rhythm. Respiratory- CPAP support on ventilator.  Neuro -  Pt is intubated off sedation, eyes closed, squeezed R hand on request but no other commands. With forced eye opening, eyes in mid position, not blinking to visual threat, doll's eyes absent, not tracking, pupils 2mm, sluggish to light. Corneal reflex present weakly on the R, gag and cough present. Breathing over the vent, on CPAP with PS.  Facial symmetry not able to test due to ET tube.  Tongue protrusion not cooperative. On pain stimulation, localizing with RUE and barely against gravity. Slight withdraw BLEs and no movement of LUE, intermittent R  toe movement spontaneously. Sensation, coordination and gait not tested.   ASSESSMENT/PLAN Ms. Jailey Booton Carpino is a 69 y.o. 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 CT showed R brain IPH w/ IVH and developing hydrocephalus. Warfarin reversed. EVD placed following worsening hydrocephalus.    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 Hydrocephalus.  CTA head & neck worsening R lateral IVH. No LVO or stenoses.  CT head 1/29 worsening R IVH w/ ventricular trapping, new basilar SAH w/ trace SAH L brain. 2mm R to L midline shift CT head 1/29 0850 decreasing hydrocephalus, R frontal EVD, 2mm midline shift remains Repeat CT head 1/30 No significant interval change in appearance of bilateral intraventricular hemorrhage, primarily within the right lateral ventricle, with blood again demonstrated within the third and fourth ventricles. Unchanged 5 mm right to left midline shift. Similar degree of hydrocephalus MRI w/wo Large  intraventricular/periventricular hemorrhage, relatively stable, approximately 4mm of right-to-left shift. Few patchy foci of restricted diffusion about the falx and within the periventricular and peripheral right cerebral hemisphere, consistent with small acute ischemic infarcts. Right frontal approach EVD in place with tip in the left lateral ventricle. Stable ventricular size and morphology without worsened hydrocephalus. Small volume subarachnoid hemorrhage about the basilar cisterns,stable. CT repeat 2/3 Stable hemorrhage within the right cerebral hemisphere with stable SAH along the bilateral cerebral convexities and sylvian fissures, and stable bilateral intraventricular hemorrhage. Stable edema and sulcal effacement throughout the right cerebral hemisphere with stable 5 mm right-to-left midline shift. 2D Echo 07/2023 EF 60-65%. No source of embolus LDL 79 A1C 6.9 VTE prophylaxis - lovenox   warfarin daily prior to admission, now on No antithrombotic given ICH Therapy recommendations:  pending  Disposition:  pending  Cerebral Edema Acute Hydrocephalus EVD placed 1/29 ([facility]) EVD drainage has improved overnight 3% stopped 1/30-> NS @ 40->off->3% @ 50->NS @ 50 23.4% bolus x 1 on 2/2 Na 135->146->145->147->154->155--159--152-151-156--153--149 Repeat Na q6h->daily Allow Na gradually trending down  Positive lupus Anticoagulant Hx DVT On chronic warfarin, reversed This admission INR 3.1->Kcentra ->1.2 Hx UGIB w/ polyp removal 08/2022 admission 07/2023 w/ supratherapeutic INR and anemia  Hypoxic Respiratory Failure Intubated 1/29 d/t neuro decline CCM on board On SBT Off precedex  Not candidate for extubation due to mental status Tmax 100.4->100.6->100.7->100.6 Leukocytosis WBC 11.1--9.2--9.3--8.9 Sputum culture MRSA Now on Rocephin ->Ancef ->zyvox  D/c seroquel  150mg  daily Pending trach today  Hypertensive Emergency Home meds:  clonidine  0.1 bid, diltiazem  cd 240, lasix  40 high on  arrival  Cleviprex  gtt off On clonidine  0.3 tid and cozaar  100 on amlodipine  10 and coreg  12.5->25 bid Add hydralazine  50 Q8h SBP Bacci < 160  Hyperlipidemia Home meds:  crestor  10mg  LDL 79, Ironside < 70 Resume home crestor  Continue statin at discharge  Diabetes type II controlled Home meds:  metformin , ozempic  HgbA1c 6.9, at [place] < 7.0 CBGs [facility]SI Outpt PCP follow up for DM control  Dysphagia Secondary to stroke NPO Speech on board On TF @ 45  S/p PEG 2/5  Other Stroke Risk Factors Advanced Age >/= 53  Remote hx cocaine use (quit 2003, reab 2005). UDS positive this admission for benzos and fentanyl  (given inpatient) Morbid Obesity, Body mass index is 45.37 kg/m. 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 This patient's care requires constant monitoring of vital signs, hemodynamics, respiratory and cardiac monitoring, review of multiple databases, neurological assessment, discussion with family, other specialists and medical decision making of high complexity. I spent 35 minutes of neurocritical care time in the care of this patient. I had long discussion with granddaughter at bedside, updated pt current condition, treatment plan and potential prognosis, and answered  all the questions. They expressed understanding and appreciation. Discussed with Dr. Harold CCM and Dr. Sebastian trauma team.

## 2024-04-24 NOTE — Progress Notes (Signed)
 Patient ID: [name], female   DOB: [birth year], 69 y.o.   MRN: [id number]      Subjective: On vent ROS negative except as listed above. Objective: Vital signs in last 24 hours: Temp:  [99.3 F (37.4 C)-100.8 F (38.2 C)] 99.8 F (37.7 C) (02/05 0800) Pulse Rate:  [60-84] 79 (02/05 0800) Resp:  [13-35] 24 (02/05 0800) BP: (96-160)/(59-100) 142/97 (02/05 0800) SpO2:  [98 %-100 %] 100 % (02/05 0800) FiO2 (%):  [30 %] 30 % (02/05 0748) Weight:  [127.5 kg] 127.5 kg (02/05 0500) Last BM Date : 04/23/24  Intake/Output from previous day: 02/04 0701 - 02/05 0700 In: 2143.6 [I.V.:783.6; NG/GT:845; IV Piggyback:515] Out: 1680 [Urine:1400; Drains:130; Stool:150] Intake/Output this shift: Total I/O In: -  Out: 10 [Drains:10]  Abd soft, NT   Lab Results: CBC  Recent Labs    04/22/24 0528 04/23/24 0531  WBC 11.0* 8.9  HGB 10.1* 9.3*  HCT 32.2* 30.5*  PLT 334 295   BMET Recent Labs    04/23/24 0531 04/23/24 1148 04/23/24 2338 04/24/24 0605  NA 155*   < > 151* 152*  K 3.6  --   --  3.5  CL 122*  --   --  120*  CO2 22  --   --  21*  GLUCOSE 194*  --   --  140*  BUN 21  --   --  18  CREATININE 0.80  --   --  0.71  CALCIUM  8.7*  --   --  8.1*   < > = values in this interval not displayed.   PT/INR No results for input(s): LABPROT, INR in the last 72 hours. ABG No results for input(s): PHART, HCO3 in the last 72 hours.  Invalid input(s): PCO2, PO2  Studies/Results: No results found.  Anti-infectives: Anti-infectives (From admission, onward)    Start     Dose/Rate Route Frequency Ordered Stop   04/22/24 0400  ceFAZolin  (ANCEF ) IVPB 2g/100 mL premix  Status:  Discontinued        2 g 200 mL/hr over 30 Minutes Intravenous Every 8 hours 04/21/24 0953 04/21/24 1404   04/21/24 1500  linezolid  (ZYVOX ) tablet 600 mg        600 mg Per Tube Every 12 hours 04/21/24 1404     04/17/24 0400  cefTRIAXone  (ROCEPHIN ) 2 g in sodium chloride  0.9 % 100 mL  IVPB  Status:  Discontinued       Note to Pharmacy: Please adjust dose per pharmacy   2 g 200 mL/hr over 30 Minutes Intravenous Every 24 hours 04/17/24 0216 04/21/24 0953       Assessment/Plan: R CR ICH with IVH - need for enteral access. For PEG at the bedside today. Consent documented and I spoke with family at the bedside and speaker phone this AM.   LOS: 8 days    Dann Hummer, MD, MPH, FACS Trauma & General Surgery Use [email or web address] to contact on call provider  04/24/2024

## 2024-04-24 NOTE — Consult Note (Signed)
 Reason for Consult:Respiratory failure, ventilator dependence, obesity  HPI: [name] is a 69 y.o. female who was admitted on January 28 for treatment of her subarachnoid hemorrhage.  She was noted to have acute respiratory failure, requiring intubation and ventilator support.  She has a complex medical history, involving hypertension, HLD, DM, chronic anticoagulation secondary to lupus and previous DVTs, OSA, COPD, and oxygen  dependence.  The patient continues to be ventilator dependent throughout her hospital stay.  Attempt by critical care medicine to perform percutaneous tracheostomy was unsuccessful secondary to her obesity and body habitus.  ENT is therefore consulted for possible open tracheostomy in the operating room.  Past Medical History:  Diagnosis Date   Adrenal mass 03/226   Benign   Arthritis    knees/multiple orthopedic conditons; lower back   Asthma    per pt   Clotting disorder    +beta-2 -glycoprotein IgA antibody   COPD (chronic obstructive pulmonary disease) (HCC)    inhalers dependent on environment   Depression    Diabetes mellitus    120s usually fasting -  dx more than 10 yrs ago   Dizziness, nonspecific    DVT (deep venous thrombosis) (HCC)    Recurrent   Fibromyalgia    GERD (gastroesophageal reflux disease)    Heart murmur    History of blood transfusion    History of cocaine abuse (HCC)    Remote history    History of kidney stones    Hyperlipidemia    Hypertension    takes meds daily   Hypothyroidism    Lupus Anticoagulant Positive    On home oxygen  therapy    at night   Seizure (HCC)    no seizure in 1.5-2 years per patient (12/17/23)   Shortness of breath    exertion or lying flat   Sleep apnea    2l of oxygen  at night (as of 12/6, she used to)- wears CPAP    Past Surgical History:  Procedure Laterality Date   ABDOMINAL HYSTERECTOMY  1989   Fibroids   ANTERIOR LUMBAR FUSION  01/03/2012   Procedure: ANTERIOR LUMBAR FUSION 1  LEVEL;  Surgeon: Rockey LITTIE Peru, MD;  Location: [facility];  Service: Neurosurgery;  Laterality: N/A;  Lumbar Four-Five Anterior Lumbar Interbody Fusion with Instrumentation   BACK SURGERY     cervical spine---disk disease   CARPAL TUNNEL RELEASE     Bilateral   CESAREAN SECTION     CHOLECYSTECTOMY     CYSTOSCOPY/RETROGRADE/URETEROSCOPY/STONE EXTRACTION WITH BASKET Right 04/26/2014   Procedure: CYSTOSCOPY/ RIGHT RETROGRADE/ RIGHT URETEROSCOPY/URETERAL AND RENAL PELVIS BIOPSY;  Surgeon: Ricardo Likens, MD;  Location: [facility];  Service: Urology;  Laterality: Right;   ESOPHAGOGASTRODUODENOSCOPY (EGD) WITH PROPOFOL  N/A 08/25/2022   Procedure: ESOPHAGOGASTRODUODENOSCOPY (EGD) WITH PROPOFOL ;  Surgeon: Rollin Dover, MD;  Location: [facility];  Service: Gastroenterology;  Laterality: N/A;   gallstones removed     HEMOSTASIS CLIP PLACEMENT  08/25/2022   Procedure: HEMOSTASIS CLIP PLACEMENT;  Surgeon: Rollin Dover, MD;  Location: [facility];  Service: Gastroenterology;;   HOT HEMOSTASIS N/A 08/25/2022   Procedure: HOT HEMOSTASIS (ARGON PLASMA COAGULATION/BICAP);  Surgeon: Rollin Dover, MD;  Location: [facility];  Service: Gastroenterology;  Laterality: N/A;   POLYPECTOMY  08/25/2022   Procedure: POLYPECTOMY;  Surgeon: Rollin Dover, MD;  Location: [facility];  Service: Gastroenterology;;   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   bilateral   right elbow surgery     RIGHT HEART CATH N/A 02/28/2021   Procedure: RIGHT HEART  CATH;  Surgeon: Verlin Lonni BIRCH, MD;  Location: [facility];  Service: Cardiovascular;  Laterality: N/A;   THYROID  LOBECTOMY Right 02/27/2017   THYROID  LOBECTOMY Right 02/27/2017   Procedure: RIGHT THYROID  LOBECTOMY;  Surgeon: Vanderbilt Ned, MD;  Location: [facility];  Service: General;  Laterality: Right;   TUBAL LIGATION      Family History  Problem Relation Age of Onset   Hypertension Mother    Diabetes Mother    Cancer Mother        Pancreatic   Hypertension  Father    Heart failure Father    Hyperlipidemia Other    COPD Other     Social History:  reports that she has never smoked. She has never used smokeless tobacco. She reports that she does not currently use drugs after having used the following drugs: Cocaine. She reports that she does not drink alcohol.  Allergies: Allergies[1]  Medications: I have reviewed the patient's current medications. Scheduled:  amLODipine   10 mg Per Tube Daily   bethanechol   10 mg Per Tube TID   carvedilol   25 mg Per Tube BID WC   Chlorhexidine  Gluconate Cloth  6 each Topical Q0600   cloNIDine   0.3 mg Per Tube TID   [START ON 04/25/2024] enoxaparin  (LOVENOX ) injection  40 mg Subcutaneous Q24H   feeding supplement (PROSource TF20)  60 mL Per Tube Daily   hydrALAZINE   50 mg Per Tube Q8H   insulin  aspart  0-20 Units Subcutaneous Q4H   lidocaine -EPINEPHrine   20 mL Intradermal Once   linezolid   600 mg Per Tube Q12H   losartan   100 mg Per Tube Daily   mouth rinse  15 mL Mouth Rinse Q2H   pantoprazole  (PROTONIX ) IV  40 mg Intravenous QHS   polyethylene glycol  17 g Per Tube Daily   senna  1 tablet Per Tube BID   sodium chloride  flush  10-40 mL Intracatheter Q12H   Continuous:  sodium chloride  50 mL/hr at 04/24/24 2000   feeding supplement (OSMOLITE 1.5 CAL) 45 mL/hr at 04/24/24 2000   sodium chloride  (hypertonic) Stopped (04/22/24 1457)    Results for orders placed or performed during the hospital encounter of 04/16/24 (from the past 48 hours)  Sodium     Status: Abnormal   Collection Time: 04/22/24 11:18 PM  Result Value Ref Range   Sodium 155 (H) 135 - 145 mmol/L    Comment: Performed at [facility], [address] [place], [place] [address]  Glucose, capillary     Status: Abnormal   Collection Time: 04/22/24 11:38 PM  Result Value Ref Range   Glucose-Capillary 180 (H) 70 - 99 mg/dL    Comment: Glucose reference range applies only to samples taken after fasting for at least 8 hours.  Glucose,  capillary     Status: Abnormal   Collection Time: 04/23/24  3:35 AM  Result Value Ref Range   Glucose-Capillary 188 (H) 70 - 99 mg/dL    Comment: Glucose reference range applies only to samples taken after fasting for at least 8 hours.  Basic metabolic panel     Status: Abnormal   Collection Time: 04/23/24  5:31 AM  Result Value Ref Range   Sodium 155 (H) 135 - 145 mmol/L   Potassium 3.6 3.5 - 5.1 mmol/L   Chloride 122 (H) 98 - 111 mmol/L   CO2 22 22 - 32 mmol/L   Glucose, Bld 194 (H) 70 - 99 mg/dL    Comment:  Glucose reference range applies only to samples taken after fasting for at least 8 hours.   BUN 21 8 - 23 mg/dL   Creatinine, Ser 9.19 0.44 - 1.00 mg/dL   Calcium  8.7 (L) 8.9 - 10.3 mg/dL   GFR, Estimated >39 >39 mL/min    Comment: (NOTE) Calculated using the CKD-EPI Creatinine Equation (2021)    Anion gap 11 5 - 15    Comment: Performed at [facility], [address] [place], [place] [address]  CBC     Status: Abnormal   Collection Time: 04/23/24  5:31 AM  Result Value Ref Range   WBC 8.9 4.0 - 10.5 K/uL   RBC 3.66 (L) 3.87 - 5.11 MIL/uL   Hemoglobin 9.3 (L) 12.0 - 15.0 g/dL   HCT 69.4 (L) 63.9 - 53.9 %   MCV 83.3 80.0 - 100.0 fL   MCH 25.4 (L) 26.0 - 34.0 pg   MCHC 30.5 30.0 - 36.0 g/dL   RDW 81.4 (H) 88.4 - 84.4 %   Platelets 295 150 - 400 K/uL   nRBC 0.2 0.0 - 0.2 %    Comment: Performed at [facility], [address], [place], [place] [address]  Glucose, capillary     Status: Abnormal   Collection Time: 04/23/24  7:36 AM  Result Value Ref Range   Glucose-Capillary 183 (H) 70 - 99 mg/dL    Comment: Glucose reference range applies only to samples taken after fasting for at least 8 hours.  Sodium     Status: Abnormal   Collection Time: 04/23/24 11:48 AM  Result Value Ref Range   Sodium 153 (H) 135 - 145 mmol/L    Comment: Performed at [facility], [address], [place], [place] [address]  Phosphorus     Status: Abnormal   Collection  Time: 04/23/24 11:48 AM  Result Value Ref Range   Phosphorus 2.1 (L) 2.5 - 4.6 mg/dL    Comment: Performed at [facility], [address] [place], [place] [address]  Glucose, capillary     Status: Abnormal   Collection Time: 04/23/24 11:48 AM  Result Value Ref Range   Glucose-Capillary 179 (H) 70 - 99 mg/dL    Comment: Glucose reference range applies only to samples taken after fasting for at least 8 hours.  Glucose, capillary     Status: Abnormal   Collection Time: 04/23/24  3:20 PM  Result Value Ref Range   Glucose-Capillary 173 (H) 70 - 99 mg/dL    Comment: Glucose reference range applies only to samples taken after fasting for at least 8 hours.  Sodium     Status: Abnormal   Collection Time: 04/23/24  5:56 PM  Result Value Ref Range   Sodium 152 (H) 135 - 145 mmol/L    Comment: Performed at [facility], [address] [place], [place] [address]  Glucose, capillary     Status: Abnormal   Collection Time: 04/23/24  7:36 PM  Result Value Ref Range   Glucose-Capillary 153 (H) 70 - 99 mg/dL    Comment: Glucose reference range applies only to samples taken after fasting for at least 8 hours.  Glucose, capillary     Status: Abnormal   Collection Time: 04/23/24 11:33 PM  Result Value Ref Range   Glucose-Capillary 167 (H) 70 - 99 mg/dL    Comment: Glucose reference range applies only to samples taken after fasting for at least 8 hours.  Sodium  Status: Abnormal   Collection Time: 04/23/24 11:38 PM  Result Value Ref Range   Sodium 151 (H) 135 - 145 mmol/L    Comment: Performed at [facility], [address], [place], [place] [address]  Glucose, capillary     Status: Abnormal   Collection Time: 04/24/24  3:30 AM  Result Value Ref Range   Glucose-Capillary 126 (H) 70 - 99 mg/dL    Comment: Glucose reference range applies only to samples taken after fasting for at least 8 hours.  Basic metabolic panel with GFR     Status: Abnormal   Collection Time: 04/24/24   6:05 AM  Result Value Ref Range   Sodium 152 (H) 135 - 145 mmol/L   Potassium 3.5 3.5 - 5.1 mmol/L   Chloride 120 (H) 98 - 111 mmol/L   CO2 21 (L) 22 - 32 mmol/L   Glucose, Bld 140 (H) 70 - 99 mg/dL    Comment: Glucose reference range applies only to samples taken after fasting for at least 8 hours.   BUN 18 8 - 23 mg/dL   Creatinine, Ser 9.28 0.44 - 1.00 mg/dL   Calcium  8.1 (L) 8.9 - 10.3 mg/dL   GFR, Estimated >39 >39 mL/min    Comment: (NOTE) Calculated using the CKD-EPI Creatinine Equation (2021)    Anion gap 10 5 - 15    Comment: Performed at [facility], [address] [place], [place] [address]  Phosphorus     Status: None   Collection Time: 04/24/24  6:05 AM  Result Value Ref Range   Phosphorus 2.8 2.5 - 4.6 mg/dL    Comment: Performed at [facility], [address], [place], [place] [address]  Magnesium      Status: None   Collection Time: 04/24/24  6:05 AM  Result Value Ref Range   Magnesium  2.2 1.7 - 2.4 mg/dL    Comment: Performed at [facility], [address] [place], [place] [address]  Glucose, capillary     Status: Abnormal   Collection Time: 04/24/24  7:18 AM  Result Value Ref Range   Glucose-Capillary 126 (H) 70 - 99 mg/dL    Comment: Glucose reference range applies only to samples taken after fasting for at least 8 hours.  Glucose, capillary     Status: Abnormal   Collection Time: 04/24/24 11:16 AM  Result Value Ref Range   Glucose-Capillary 130 (H) 70 - 99 mg/dL    Comment: Glucose reference range applies only to samples taken after fasting for at least 8 hours.  Sodium     Status: Abnormal   Collection Time: 04/24/24 11:43 AM  Result Value Ref Range   Sodium 149 (H) 135 - 145 mmol/L    Comment: Performed at [facility], [address], [place], [place] [address]  Glucose, capillary     Status: Abnormal   Collection Time: 04/24/24  3:06 PM  Result Value Ref Range   Glucose-Capillary 134 (H) 70 - 99 mg/dL    Comment:  Glucose reference range applies only to samples taken after fasting for at least 8 hours.  Glucose, capillary     Status: Abnormal   Collection Time: 04/24/24  7:40 PM  Result Value Ref Range   Glucose-Capillary 180 (H) 70 - 99 mg/dL    Comment: Glucose reference range applies only to samples taken after fasting for at least 8 hours.   *Note: Due to a large number of results and/or  encounters for the requested time period, some results have not been displayed. A complete set of results can be found in Results Review.    ROS: unable, on vent   Blood pressure 123/81, pulse 80, temperature (!) 100.6 F (38.1 C), temperature source Axillary, resp. rate (!) 31, height 5' 6 (1.676 m), weight 127.5 kg, SpO2 100%. Physical Exam General: obese black female who is laying in bed in NAD, sedated, on vent Head: Normocephalic, atraumatic, except EVD in place  Ears: Examination of the ears shows normal auricles and external auditory canals bilaterally.  Nose: Nasal examination shows normal mucosa, septum, turbinates.  Face: Facial examination shows no asymmetry.  Mouth: ET tube in place. No bleeding. Neck: Short neck. Laryngeal framework not palpable. Neuro: Sedated. Minimally responsive,  Assessment/Plan: Recent subarachnoid hemorrhage.  Currently with chronic respiratory failure and ventilator dependence.  Percutaneous tracheostomy by critical care medicine was unsuccessful due to her body habitus. - Plan open tracheostomy in the operating room tomorrow. - NPO after midnight  [place]ul[place] W Nikiyah Fackler 04/24/2024, 8:56 PM      [1]  Allergies Allergen Reactions   Lisinopril  Anaphylaxis, Swelling and Other (See Comments)    THROAT SWELLING, Pt states her throat started to close up after being on it for so long.    Prednisone  Anaphylaxis, Swelling and Other (See Comments)    Throat swelling and tongue irritation   Spironolactone  Other (See Comments)    Caused stroke-like symptoms with the mouth    Corticosteroids Rash    States she developed a rash on her tongue after a steroid injection in her back

## 2024-04-24 NOTE — Progress Notes (Signed)
 "  NAME:  [name], MRN:  [id number], DOB:  01-28-1956, LOS: 8 ADMISSION DATE:  04/16/2024, CONSULTATION DATE:  04/17/24 REFERRING MD:  neuro, CHIEF COMPLAINT:  ich and declining mental status   History of Present Illness:  69 yo female presented with c/o headache n/v. Pt found to have acute R ICH with ventricular hemorrhage and moderate hydrocephalus.  Prior to presentation pt reportedly called her neighbor stating she was not feeling well and that her head hurt. She was complaining of n/v/d with concern she was having food poisoning. History was obtained from chart and family as while pt was following commands she was requiring relatively freq stimulation to arouse.   While initially, pt was able to arouse and follow commands with some L le weakness however during her time here her mentation continued to decline. Repeat scan revealed worsening bleed with new SAH and 2mm midline shift. Neurosx consulted and placed bedside EVD. No improvement in mentation. Now with sonorous respirations and unable to arouse. Habitus and chronic cpap use precludes pt to obstruct airway and decision was made to electively intubate at this time to protect airway 2/2 declining mental status. Family at bedside and updated on critical state, worsening bleed and mental status and need for intubation. Family confirms full code  Pertinent  Medical History  HTN Hyperlipidemia Dm2 with hyperglycemia Migraines Chronic a/c 2/2 + lupus and h/o bl dvt (coumadin ) OSA on cpap L spine stenosis s/p decompression  Significant Hospital Events: Including procedures, antibiotic start and stop dates in addition to other pertinent events   Admitted to neuro ICU 1/28 Evd placement 1/29 Ccm consulted for intubation 1/29 1/30: Intubated, sedated, remain afebrile, off clevidipine  infusion 1/31 patient spiked fever with Tmax 100.4, remained on low-dose Precedex .  Still remained unresponsive 1/31 off all sedation  2/4: palliative  care consulted 2/5: Trach/Peg today  Interim History / Subjective:   Patient remains off sedation, not following commands, but responds to pain in RUE, RLE, LLE, and slower to respond with RUE. Daughter at bedside, plan for trach/peg today.   Objective    Blood pressure (!) 136/90, pulse 76, temperature 99.8 F (37.7 C), temperature source Axillary, resp. rate (!) 25, height 5' 6 (1.676 m), weight 127.5 kg, SpO2 100%.    Vent Mode: PRVC FiO2 (%):  [30 %] 30 % Set Rate:  [20 bmp] 20 bmp Vt Set:  [470 mL] 470 mL PEEP:  [5 cmH20] 5 cmH20 Pressure Support:  [8 cmH20] 8 cmH20 Plateau Pressure:  [16 cmH20-23 cmH20] 16 cmH20   Intake/Output Summary (Last 24 hours) at 04/24/2024 1009 Last data filed at 04/24/2024 0800 Gross per 24 hour  Intake 2193.59 ml  Output 1670 ml  Net 523.59 ml   Filed Weights   04/22/24 0500 04/23/24 0500 04/24/24 0500  Weight: 126.9 kg 127.8 kg 127.5 kg     Physical Exam Vitals and nursing note reviewed.  Constitutional:      Appearance: She is obese.     Comments: Acute on chronically ill appearing female, off sedation, remains on ventilator  HENT:     Head:     Comments: R frontal EVD    Right Ear: External ear normal.     Left Ear: External ear normal.     Nose: Nose normal.     Mouth/Throat:     Pharynx: Oropharynx is clear.  Eyes:     Pupils: Pupils are equal, round, and reactive to light.  Cardiovascular:     Rate  and Rhythm: Normal rate and regular rhythm.  Pulmonary:     Effort: Pulmonary effort is normal. No respiratory distress.     Breath sounds: No wheezing (faint expiratory wheeze).     Comments: Remains on ventilator Abdominal:     General: Abdomen is flat.     Palpations: Abdomen is soft.  Genitourinary:    Comments: foley Musculoskeletal:     Right lower leg: No edema.     Left lower leg: No edema.  Skin:    General: Skin is warm and dry.  Neurological:     Comments: Patient remains on ventilator, off sedation, not  following commands, responds to painful stimuli with withdrawal. Not opening her eyes, but does close eyes tightly with manual opening of eye lids. Daughter reports that the patient moved her thumb this morning.          Patient Lines/Drains/Airways Status     Active Line/Drains/Airways     Name Placement date Placement time Site Days   Peripheral IV 04/16/24 20 G 1 Anterior;Left Forearm 04/16/24  2202  Forearm  8   PICC Triple Lumen 04/17/24 Right Brachial 41 cm 1 cm 04/17/24  1545  -- 7   Flatus Tube/Pouch 04/21/24  2000  --  3   Urethral Catheter Bard Gasmen, RN 14 Fr. 04/18/24  0015  --  6   ICP/Ventriculostomy Ventricular drainage catheter Right Temporal region 04/17/24  0200  Temporal region  7   Airway 7.5 mm 04/17/24  0445  -- 7   Small Bore Feeding Tube 10 Fr. Left nare Marking at nare/corner of mouth 65 cm 04/18/24  1523  Left nare  6            Resolved problem list  Hypokalemia/hypophosphatemia  Assessment and Plan  Acute right periventricular intraparenchymal hemorrhage with IVH in the setting of anticoagulation with warfarin and hypertensive emergency Acute obstructive hydrocephalus status post EVD Cerebral edema with brain compression Hypertensive emergency Acute encephalopathy in the setting of acute right intraparenchymal hemorrhage Induced hypernatremia Patient presented with acute onset headache, nausea with vomiting, diarrhea. Takes Coumadin  with INR 3.1 on arrival, reversed with Kcentra  and Vit K. Patient became difficult to arouse shortly after admission, with R gaze preference, and difficulty moving left side. Repeat CTH showed worsening hemorrhage with subarachnoid hemorrhage and 2mm mass effect. NSGY placed EVD 1/29. Repeat CTH stable 2/3. Off sedation since 1/31. -NSGY managing  -SBP Trzcinski<160: Amlodipine  10mg , Coreg  25mg  BID, Losartan  100mcg, clonidine  0.3mg  TID -Na [place] 150-155, off 3% saline -Plan is for peg/trach today  Acute hypoxic respiratory  failure  Aspiration/ MRSA PNA Asthma  -Tolerating pressure support on daily SBT, neuro exam unchanged and is a barrier to extubation. 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

## 2024-04-25 ENCOUNTER — Encounter (HOSPITAL_COMMUNITY): Payer: Self-pay | Admitting: Neurology

## 2024-04-25 ENCOUNTER — Inpatient Hospital Stay (HOSPITAL_COMMUNITY): Admitting: Anesthesiology

## 2024-04-25 ENCOUNTER — Ambulatory Visit: Admitting: Physician Assistant

## 2024-04-25 ENCOUNTER — Encounter (HOSPITAL_COMMUNITY): Admission: EM | Payer: Self-pay | Source: Home / Self Care | Attending: Neurology

## 2024-04-25 DIAGNOSIS — Z9911 Dependence on respirator [ventilator] status: Secondary | ICD-10-CM

## 2024-04-25 LAB — CBC
HCT: 30 % — ABNORMAL LOW (ref 36.0–46.0)
Hemoglobin: 9.3 g/dL — ABNORMAL LOW (ref 12.0–15.0)
MCH: 25.7 pg — ABNORMAL LOW (ref 26.0–34.0)
MCHC: 31 g/dL (ref 30.0–36.0)
MCV: 82.9 fL (ref 80.0–100.0)
Platelets: 234 10*3/uL (ref 150–400)
RBC: 3.62 MIL/uL — ABNORMAL LOW (ref 3.87–5.11)
RDW: 18.2 % — ABNORMAL HIGH (ref 11.5–15.5)
WBC: 9.8 10*3/uL (ref 4.0–10.5)
nRBC: 0 % (ref 0.0–0.2)

## 2024-04-25 LAB — BASIC METABOLIC PANEL WITH GFR
Anion gap: 13 (ref 5–15)
BUN: 24 mg/dL — ABNORMAL HIGH (ref 8–23)
CO2: 19 mmol/L — ABNORMAL LOW (ref 22–32)
Calcium: 8.3 mg/dL — ABNORMAL LOW (ref 8.9–10.3)
Chloride: 114 mmol/L — ABNORMAL HIGH (ref 98–111)
Creatinine, Ser: 0.81 mg/dL (ref 0.44–1.00)
GFR, Estimated: >60 mL/min
Glucose, Bld: 173 mg/dL — ABNORMAL HIGH (ref 70–99)
Potassium: 3.4 mmol/L — ABNORMAL LOW (ref 3.5–5.1)
Sodium: 146 mmol/L — ABNORMAL HIGH (ref 135–145)

## 2024-04-25 LAB — GLUCOSE, CAPILLARY
Glucose-Capillary: 141 mg/dL — ABNORMAL HIGH (ref 70–99)
Glucose-Capillary: 149 mg/dL — ABNORMAL HIGH (ref 70–99)
Glucose-Capillary: 149 mg/dL — ABNORMAL HIGH (ref 70–99)
Glucose-Capillary: 152 mg/dL — ABNORMAL HIGH (ref 70–99)
Glucose-Capillary: 162 mg/dL — ABNORMAL HIGH (ref 70–99)
Glucose-Capillary: 194 mg/dL — ABNORMAL HIGH (ref 70–99)

## 2024-04-25 LAB — SURGICAL PCR SCREEN
MRSA, PCR: POSITIVE — AB
Staphylococcus aureus: POSITIVE — AB

## 2024-04-25 MED ORDER — MIDAZOLAM HCL 2 MG/2ML IJ SOLN
INTRAMUSCULAR | Status: AC
  Filled 2024-04-25: qty 2

## 2024-04-25 MED ORDER — PHENYLEPHRINE HCL-NACL 20-0.9 MG/250ML-% IV SOLN
INTRAVENOUS | Status: DC | PRN
  Administered 2024-04-25: 20 ug/min via INTRAVENOUS

## 2024-04-25 MED ORDER — LACTATED RINGERS IV SOLN: INTRAVENOUS | Status: DC | PRN

## 2024-04-25 MED ORDER — POTASSIUM CHLORIDE 20 MEQ PO PACK
40.0000 meq | PACK | Freq: Once | ORAL | Status: AC
  Administered 2024-04-25: 40 meq
  Filled 2024-04-25: qty 2

## 2024-04-25 MED ORDER — MUPIROCIN 2 % EX OINT
1.0000 | TOPICAL_OINTMENT | Freq: Two times a day (BID) | CUTANEOUS | Status: AC
  Administered 2024-04-25 (×3): 1 via NASAL
  Filled 2024-04-25: qty 22

## 2024-04-25 MED ORDER — PROPOFOL 10 MG/ML IV BOLUS
INTRAVENOUS | Status: AC
  Filled 2024-04-25: qty 20

## 2024-04-25 MED ORDER — PHENYLEPHRINE 80 MCG/ML (10ML) SYRINGE FOR IV PUSH (FOR BLOOD PRESSURE SUPPORT)
PREFILLED_SYRINGE | INTRAVENOUS | Status: DC | PRN
  Administered 2024-04-25: 80 ug via INTRAVENOUS
  Administered 2024-04-25: 160 ug via INTRAVENOUS
  Administered 2024-04-25: 80 ug via INTRAVENOUS

## 2024-04-25 MED ORDER — ROCURONIUM BROMIDE 10 MG/ML (PF) SYRINGE
PREFILLED_SYRINGE | INTRAVENOUS | Status: DC | PRN
  Administered 2024-04-25: 50 mg via INTRAVENOUS
  Administered 2024-04-25: 20 mg via INTRAVENOUS
  Administered 2024-04-25: 30 mg via INTRAVENOUS

## 2024-04-25 MED ORDER — HEMOSTATIC AGENTS (NO CHARGE) OPTIME
TOPICAL | Status: DC | PRN
  Administered 2024-04-25: 2 via TOPICAL

## 2024-04-25 MED ORDER — FENTANYL CITRATE (PF) 250 MCG/5ML IJ SOLN
INTRAMUSCULAR | Status: DC | PRN
  Administered 2024-04-25: 100 ug via INTRAVENOUS

## 2024-04-25 MED ORDER — 0.9 % SODIUM CHLORIDE (POUR BTL) OPTIME
TOPICAL | Status: DC | PRN
  Administered 2024-04-25: 1000 mL

## 2024-04-25 MED ORDER — LIDOCAINE-EPINEPHRINE 1 %-1:100000 IJ SOLN
INTRAMUSCULAR | Status: DC | PRN
  Administered 2024-04-25: 6 mL

## 2024-04-25 MED ORDER — MIDAZOLAM HCL (PF) 2 MG/2ML IJ SOLN
INTRAMUSCULAR | Status: DC | PRN
  Administered 2024-04-25 (×2): 2 mg via INTRAVENOUS

## 2024-04-25 MED ORDER — LIDOCAINE-EPINEPHRINE 1 %-1:100000 IJ SOLN
INTRAMUSCULAR | Status: AC
  Filled 2024-04-25: qty 1

## 2024-04-25 MED ORDER — FENTANYL CITRATE (PF) 100 MCG/2ML IJ SOLN
INTRAMUSCULAR | Status: AC
  Filled 2024-04-25: qty 2

## 2024-04-25 MED ORDER — ENOXAPARIN SODIUM 40 MG/0.4ML IJ SOSY: 40.0000 mg | PREFILLED_SYRINGE | INTRAMUSCULAR | Status: AC

## 2024-04-25 NOTE — Anesthesia Preprocedure Evaluation (Addendum)
 "                                  Anesthesia Evaluation  Patient identified by MRN, date of birth, ID band  Reviewed: Unable to perform ROS - Chart review only  History of Anesthesia Complications Negative for: history of anesthetic complications  Airway Mallampati: Intubated      Comment: Previous grade II view with MAC 4, easy mask Dental  (+) Dental Advisory Given   Pulmonary asthma , sleep apnea , COPD,  oxygen  dependent   Pulmonary exam normal breath sounds clear to auscultation       Cardiovascular hypertension, Pt. on medications +CHF and + DVT  + dysrhythmias Supra Ventricular Tachycardia + Valvular Problems/Murmurs  Rhythm:Regular Rate:Normal  HLD  TTE 07/28/2023: IMPRESSIONS    1. Left ventricular ejection fraction, by estimation, is 60 to 65%. The  left ventricle has normal function. The left ventricle has no regional  wall motion abnormalities. Left ventricular diastolic parameters are  indeterminate.   2. Right ventricular systolic function is normal. The right ventricular  size is normal. Tricuspid regurgitation signal is inadequate for assessing  PA pressure.   3. The mitral valve is normal in structure. No evidence of mitral valve  regurgitation. No evidence of mitral stenosis.   4. The aortic valve is normal in structure. Aortic valve regurgitation is  not visualized. No aortic stenosis is present.   5. The inferior vena cava is normal in size with greater than 50%  respiratory variability, suggesting right atrial pressure of 3 mmHg.     Neuro/Psych Seizures -,  PSYCHIATRIC DISORDERS Anxiety Depression     Neuromuscular disease (lumbar spinal stenosis)    GI/Hepatic ,GERD  Medicated,,(+)     substance abuse  cocaine use  Endo/Other  diabetes, Type 2Hypothyroidism  Class 3 obesityAdrenal mass  Renal/GU Renal disease (right kidney mass)     Musculoskeletal  (+) Arthritis ,  Fibromyalgia -  Abdominal  (+) + obese  Peds   Hematology  (+) Blood dyscrasia, anemia Lab Results      Component                Value               Date                      WBC                      9.8                 04/25/2024                HGB                      9.3 (L)             04/25/2024                HCT                      30.0 (L)            04/25/2024                MCV  82.9                04/25/2024                PLT                      234                 04/25/2024              Anesthesia Other Findings  Admitted to neuro ICU 1/28. kes Coumadin  with INR 3.1 on arrival, reversed with Kcentra  and Vit K.   Evd placement 1/29  Ccm consulted for intubation 1/29  1/30: Intubated, sedated, remain afebrile, off clevidipine  infusion  1/31 patient spiked fever with Tmax 100.4, remained on low-dose Precedex .  Still remained unresponsive  1/31 off all sedation   2/4: palliative care consulted  2/5: PEG tube placed.  Percutaneous tracheostomy unsuccessful   Reproductive/Obstetrics                              Anesthesia Physical Anesthesia Plan  ASA: 4  Anesthesia Plan: General   Post-op Pain Management:    Induction: Intravenous  PONV Risk Score and Plan: 3 and Treatment may vary due to age or medical condition and Ondansetron   Airway Management Planned: Oral ETT and Tracheostomy  Additional Equipment:   Intra-op Plan:   Post-operative Plan: Post-operative intubation/ventilation  Informed Consent: I have reviewed the patients History and Physical, chart, labs and discussed the procedure including the risks, benefits and alternatives for the proposed anesthesia with the patient or authorized representative who has indicated his/her understanding and acceptance.     Consent reviewed with POA  Plan Discussed with: CRNA and Anesthesiologist  Anesthesia Plan Comments: (Phone consent obtained from the patient's daughter, [name], at 12:15pm. Risks  of general anesthesia discussed including, but not limited to, sore throat, hoarse voice, chipped/damaged teeth, injury to vocal cords, nausea and vomiting, allergic reactions, lung infection, heart attack, stroke, and death. All questions answered. )         Anesthesia Quick Evaluation  "

## 2024-04-25 NOTE — Progress Notes (Signed)
 "  NAME:  [name], MRN:  [id number], DOB:  [birth year], LOS: 9 ADMISSION DATE:  04/16/2024, CONSULTATION DATE:  04/17/24 REFERRING MD:  neuro, CHIEF COMPLAINT:  ich and declining mental status   History of Present Illness:  69 yo female presented with c/o headache n/v. Pt found to have acute R ICH with ventricular hemorrhage and moderate hydrocephalus.  Prior to presentation pt reportedly called her neighbor stating she was not feeling well and that her head hurt. She was complaining of n/v/d with concern she was having food poisoning. History was obtained from chart and family as while pt was following commands she was requiring relatively freq stimulation to arouse.   While initially, pt was able to arouse and follow commands with some L le weakness however during her time here her mentation continued to decline. Repeat scan revealed worsening bleed with new SAH and 2mm midline shift. Neurosx consulted and placed bedside EVD. No improvement in mentation. Now with sonorous respirations and unable to arouse. Habitus and chronic cpap use precludes pt to obstruct airway and decision was made to electively intubate at this time to protect airway 2/2 declining mental status. Family at bedside and updated on critical state, worsening bleed and mental status and need for intubation. Family confirms full code  Pertinent  Medical History  HTN Hyperlipidemia Dm2 with hyperglycemia Migraines Chronic a/c 2/2 + lupus and h/o bl dvt (coumadin ) OSA on cpap L spine stenosis s/p decompression  Significant Hospital Events: Including procedures, antibiotic start and stop dates in addition to other pertinent events   Admitted to neuro ICU 1/28. kes Coumadin  with INR 3.1 on arrival, reversed with Kcentra  and Vit K.  Evd placement 1/29 Ccm consulted for intubation 1/29 1/30: Intubated, sedated, remain afebrile, off clevidipine  infusion 1/31 patient spiked fever with Tmax 100.4, remained on low-dose  Precedex .  Still remained unresponsive 1/31 off all sedation  2/4: palliative care consulted 2/5: PEG tube placed.  Percutaneous tracheostomy unsuccessful  Interim History / Subjective:   Mental status is unchanged.  Objective    Blood pressure 123/76, pulse 74, temperature 99.5 F (37.5 C), temperature source Axillary, resp. rate (!) 29, height 5' 6 (1.676 m), weight 125.3 kg, SpO2 100%.    Vent Mode: PRVC FiO2 (%):  [30 %-100 %] 30 % Set Rate:  [20 bmp] 20 bmp Vt Set:  [470 mL] 470 mL PEEP:  [5 cmH20] 5 cmH20 Plateau Pressure:  [20 cmH20-24 cmH20] 24 cmH20   Intake/Output Summary (Last 24 hours) at 04/25/2024 0955 Last data filed at 04/25/2024 0800 Gross per 24 hour  Intake 1592.89 ml  Output 886 ml  Net 706.89 ml   Filed Weights   04/23/24 0500 04/24/24 0500 04/25/24 0454  Weight: 127.8 kg 127.5 kg 125.3 kg     Examination: Chronically ill appearing female on the ventilator, obese Unresponsive, pupils reactive Heart rate regular rate and rhythm Lungs are clear to auscultation  Labs/imaging reviewed.  Significant for Sodium 146, BUN/creatinine 24/0.81 Hemoglobin 9.3   Patient Lines/Drains/Airways Status     Active Line/Drains/Airways     Name Placement date Placement time Site Days   Peripheral IV 04/16/24 20 G 1 Anterior;Left Forearm 04/16/24  2202  Forearm  9   PICC Triple Lumen 04/17/24 Right Brachial 41 cm 1 cm 04/17/24  1545  -- 8   Gastrostomy/Enterostomy Percutaneous endoscopic gastrostomy (PEG) LUQ 04/24/24  1026  LUQ  1   Flatus Tube/Pouch 04/21/24  2000  --  4   Urethral  Catheter Bard Gasmen, RN 14 Fr. 04/18/24  0015  --  7   ICP/Ventriculostomy Ventricular drainage catheter Right Temporal region 04/17/24  0200  Temporal region  8   Airway 7.5 mm 04/17/24  0445  -- 8            Resolved problem list  Hypokalemia/hypophosphatemia  Assessment and Plan  Acute right periventricular intraparenchymal hemorrhage with IVH in the setting of  anticoagulation with warfarin and hypertensive emergency Acute obstructive hydrocephalus status post EVD Cerebral edema with brain compression Hypertensive emergency Acute encephalopathy in the setting of acute right intraparenchymal hemorrhage - Off sedation since 1/31. - Off 3% saline.  Allowing sodium to drift down - Continue EVD - Moehring SBP less than 160.  Continue Norvasc  10 mg, Coreg  25 mg twice daily, clonidine  0.3 mg 3 times daily, hydralazine  50 mg Q8, Cozaar  100 mg daily  Acute hypoxic respiratory failure  Aspiration/ MRSA PNA Asthma  Plan for tracheostomy by ENT today Continue linezolid  for 7 days for MRSA pneumonia.  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  Continue Lovenox  for DVT prophylaxis Will need outpatient follow-up with hematology/rheumatology  Type II Diabetes  A1c 6.9  SSI coverage  OSA - uses CPAP at home  Morbid obesity At risk for malnutrition, on tube feeds   Critical care time:   The patient is critically ill with multiple organ system failure and requires high complexity decision making for assessment and support, frequent evaluation and titration of therapies, advanced monitoring, review of radiographic studies and interpretation of complex data.   Critical Care Time devoted to patient care services, exclusive of separately billable procedures, described in this note is 35 minutes.   Jonanthony Nahar MD [facility] See Amion for pager  If no response to pager , please call [phone] until 7pm After 7:00 pm call Elink  [phone] 04/25/2024, 10:32 AM        "

## 2024-04-25 NOTE — Progress Notes (Signed)
 Patient ID: [name], female   DOB: 09-Dec-1955, 69 y.o.   MRN: [id number] Abd soft PEG site OK TF held for trach Please recall Trauma PRN.  Dann Hummer, MD, MPH, FACS Please use [email or web address] to contact on call provider

## 2024-04-25 NOTE — Anesthesia Postprocedure Evaluation (Signed)
"   Anesthesia Post Note  Patient: [name]  Procedure(s) Performed: CREATION, TRACHEOSTOMY (Neck)     Patient location during evaluation: ICU Anesthesia Type: General Level of consciousness: patient remains intubated per anesthesia plan Colleen management: Colleen level controlled Vital Signs Assessment: post-procedure vital signs reviewed and stable Respiratory status: respiratory function stable and patient on ventilator - see flowsheet for VS Cardiovascular status: stable Anesthetic complications: no   No notable events documented.  Last Vitals:  Vitals:   04/25/24 1458 04/25/24 1500  BP: (!) 142/80 (!) 142/80  Pulse: 77 77  Resp: (!) 31 (!) 24  Temp:    SpO2: 94% 94%    Last Colleen:  Vitals:   04/25/24 1141  TempSrc: Axillary  PainSc:    [name]: Patients Stated Colleen [name]: 0 (04/16/24 2038)                 [name]      "

## 2024-04-25 NOTE — Procedures (Signed)
 VENTILATOR WEAN NOTE 04/25/2024  Start Mode: PRVC  Wean Mode: Pressure Support  Duration before failure: No wean   Reason for failure: No wean today  Notes: Holding wean today. [name] is planned with ENT in the OR today.

## 2024-04-25 NOTE — Progress Notes (Signed)
 eLink Physician-Brief Progress Note Patient Name: [name] DOB: 12/22/1955 MRN: [id number]   Date of Service  04/25/2024  HPI/Events of Note  Patient is attempting to reach for her ET tube with her right hand.  eICU Interventions  Right hand soft restraints ordered.        Moni Rothrock U Marriah Sanderlin 04/25/2024, 1:20 AM

## 2024-04-25 NOTE — Telephone Encounter (Signed)
 Patient is currently admitted and intubated. Office appt cancelled. Suspect a lot of her medication will be changed during this hospitalization, would wait until the patient is discharged to make sure her lasix  dose is unchanged before prescribing more.

## 2024-04-25 NOTE — Progress Notes (Signed)
 Patient ID: [name], female   DOB: 1956/02/06, 69 y.o.   MRN: [id number] BP 112/73   Pulse 77   Temp 99.8 F (37.7 C) (Axillary)   Resp (!) 30   Ht 5' 6 (1.676 m)   Wt 125.3 kg   SpO2 100%   BMI 44.59 kg/m  Intubated and sedated. Not following commands Going to the OR for a tracheostomy  No neurological changes noted.

## 2024-04-25 NOTE — Transfer of Care (Signed)
 Immediate Anesthesia Transfer of Care Note  Patient: [name]  Procedure(s) Performed: CREATION, TRACHEOSTOMY  Patient Location: Neuro ICU  Anesthesia Type:General  Level of Consciousness: Patient remains intubated per anesthesia plan  Airway & Oxygen  Therapy: Patient remains intubated per anesthesia plan and Patient placed on Ventilator (see vital sign flow sheet for setting)  Post-op Assessment: Report given to RN and Post -op Vital signs reviewed and stable  Post vital signs: Reviewed and stable  Last Vitals:  Vitals Value Taken Time  BP 117/74 1440 04/25/2024  Temp    Pulse 80 1440 04/25/2024  Resp 16 1440 04/25/2024  SpO2 99 1440 04/25/2024    Last Pain:  Vitals:   04/25/24 1141  TempSrc: Axillary  PainSc:       Patients Stated Pain [name]: 0 (04/16/24 2038)  Complications: No notable events documented.

## 2024-04-25 NOTE — Op Note (Signed)
 DATE OF PROCEDURE:  04/25/2024                              OPERATIVE REPORT  SURGEON:  Daniel Moccasin, MD  PREOPERATIVE DIAGNOSES: 1. Respiratory failure. 2. Ventilator dependence  POSTOPERATIVE DIAGNOSES: 1. Respiratory failure. 2. Ventilator dependence  PROCEDURE PERFORMED:  Tracheostomy  ANESTHESIA:  General endotracheal tube anesthesia.  COMPLICATIONS:  None.  ESTIMATED BLOOD LOSS:  Minimal.  INDICATION FOR PROCEDURE:  Shadai Mcclane Diodato is a 69 y.o. female with a history of CVA, respiratory failure, and ventilator dependence. She has been ventilator dependence since her admission 2 weeks ago. The decision was therefore made to proceed with tracheostomy tube placement. Likelihood of success in ventilator weaning was also discussed with the daughter.  The risks, benefits, alternatives, and details of the procedure were discussed with the daughter.  Questions were invited and answered.  Informed consent was obtained.  DESCRIPTION:  The patient was taken to the operating room and placed supine on the operating table. General anesthesia was administered via the pre-existing endotracheal tube. The patient was positioned and prepped and draped in the standard fashion for tracheostomy tube placement. 1% lidocaine  with 1-100,000 epinephrine  was injected at the anterior neck. A 2 cm vertical incision was made in the anterior necks, at the level slightly below the cricoid bone. The incision was carried down past the level of the platysma muscle. The strep muscles were identified and divided in midline. They were retracted laterally, exposing the thyroid  gland. The thyroid  was divided at midline and retracted laterally. The anterior tracheal wall was exposed. A tracheal window was made at the level of the second tracheal ring. The endotracheal tube was withdrawn. A # 7 cuffed XLT Shiley tracheostomy tube was placed without difficulty. Good end tidal volume and CO2 return was noted. The tracheostomy tube  was secured in place with 2-0 Prolene sutures and circumferential necktie. The care of the patient was transferred to the anesthesiologist. The patient was awakened from anesthesia without difficulty. He was transferred back to the intensive care unit in stable condition.  OPERATIVE FINDINGS:  A # 7 cuffed XLT Shiley tracheostomy tube was placed without difficulty.  SPECIMEN:  None.  FOLLOWUP CARE:  The patient will return to the ICU.   Vollie Aaron W Jaken Fregia 04/25/2024 2:25 PM

## 2024-04-25 NOTE — Progress Notes (Addendum)
 STROKE TEAM PROGRESS NOTE   INTERVAL HISTORY No family is at bedside.  Pt still not open eyes but seems able to follow some simple commands on the R hand and foot. Eyes rolling movement on forced opening. Pending Trach today.    Vitals:   04/25/24 1500 04/25/24 1600 04/25/24 1700 04/25/24 1800  BP: (!) 142/80 (!) 140/80 (!) 145/70 (!) 142/77  Pulse: 77 71 78 80  Resp: (!) 24 (!) 24 (!) 26 (!) 32  Temp:  99.1 F (37.3 C)    TempSrc:  Axillary    SpO2: 94% 98% 100% 100%  Weight:      Height:       CBC:  Recent Labs  Lab 04/23/24 0531 04/25/24 0454  WBC 8.9 9.8  HGB 9.3* 9.3*  HCT 30.5* 30.0*  MCV 83.3 82.9  PLT 295 234   Basic Metabolic Panel:  Recent Labs  Lab 04/21/24 0457 04/21/24 1052 04/23/24 1148 04/23/24 1756 04/24/24 0605 04/24/24 1143 04/25/24 0454  NA 151*   < > 153*   < > 152* 149* 146*  K 3.8   < >  --   --  3.5  --  3.4*  CL 115*   < >  --   --  120*  --  114*  CO2 22   < >  --   --  21*  --  19*  GLUCOSE 211*   < >  --   --  140*  --  173*  BUN 18   < >  --   --  18  --  24*  CREATININE 0.80   < >  --   --  0.71  --  0.81  CALCIUM  9.2   < >  --   --  8.1*  --  8.3*  MG 1.9  --   --   --  2.2  --   --   PHOS 2.9   < > 2.1*  --  2.8  --   --    < > = values in this interval not displayed.   Lipid Panel:  Recent Labs  Lab 04/19/24 0427 04/21/24 0457  CHOL 169  --   TRIG 151* 233*  HDL 60  --   CHOLHDL 2.8  --   VLDL 30  --   LDLCALC 79  --    Urine Drug Screen:  Recent Labs  Lab 04/18/24 2330  LABOPIA NEGATIVE  COCAINSCRNUR NEGATIVE  LABBENZ POSITIVE*  AMPHETMU NEGATIVE  THCU NEGATIVE  LABBARB NEGATIVE    Alcohol Level  No results for input(s): ETH in the last 168 hours.   IMAGING past 24 hours No results found.    PHYSICAL EXAM General - Well nourished, well developed, intubated off sedation.  Ophthalmologic - fundi not visualized due to noncooperation. Cardiovascular - Regular rate and rhythm. Respiratory- CPAP  support on ventilator.  Neuro -  Pt is intubated off sedation, eyes closed, slowly and barely following some simple commands on the R hand and foot. With forced eye opening, rolling eye movement, not blinking to visual threat, not active tracking, pupils 2mm, sluggish to light. Corneal reflex present, gag and cough present. Breathing over the vent, on CPAP with PS.  Facial symmetry not able to test due to ET tube.  Tongue protrusion not cooperative. On pain stimulation, localizing with RUE but mildly against gravity. Slight withdraw BLEs and no movement of LUE, intermittent R toe movement spontaneously. Sensation, coordination and gait not  tested.   ASSESSMENT/PLAN [name] is a 69 y.o. 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 CT showed R brain IPH w/ IVH and developing hydrocephalus. Warfarin reversed. EVD placed following worsening hydrocephalus.    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 Hydrocephalus.  CTA head & neck worsening R lateral IVH. No LVO or stenoses.  CT head 1/29 worsening R IVH w/ ventricular trapping, new basilar SAH w/ trace SAH L brain. 2mm R to L midline shift CT head 1/29 0850 decreasing hydrocephalus, R frontal EVD, 2mm midline shift remains Repeat CT head 1/30 No significant interval change in appearance of bilateral intraventricular hemorrhage, primarily within the right lateral ventricle, with blood again demonstrated within the third and fourth ventricles. Unchanged 5 mm right to left midline shift. Similar degree of hydrocephalus MRI w/wo Large intraventricular/periventricular hemorrhage, relatively stable,  approximately 4mm of right-to-left shift. Few patchy foci of restricted diffusion about the falx and within the periventricular and peripheral right cerebral hemisphere, consistent with small acute ischemic infarcts. Right frontal approach EVD in place with tip in the left lateral ventricle. Stable ventricular size and morphology without worsened hydrocephalus. Small volume subarachnoid hemorrhage about the basilar cisterns,stable. CT repeat 2/3 Stable hemorrhage within the right cerebral hemisphere with stable SAH along the bilateral cerebral convexities and sylvian fissures, and stable bilateral intraventricular hemorrhage. Stable edema and sulcal effacement throughout the right cerebral hemisphere with stable 5 mm right-to-left midline shift. 2D Echo 07/2023 EF 60-65%. No source of embolus LDL 79 A1C 6.9 VTE prophylaxis - lovenox   warfarin daily prior to admission, now on No antithrombotic given ICH Therapy recommendations:  pending  Disposition:  pending  Cerebral Edema Acute Hydrocephalus EVD placed 1/29 ([facility]) EVD drainage has improved overnight 3% stopped 1/30-> NS @ 40->off->3% @ 50->NS @ 50 23.4% bolus x 1 on 2/2 Na 135->146->145->147->154->155--159--152-151-156--153--149--146 Repeat Na q6h->daily Allow Na gradually trending down  Positive lupus Anticoagulant Hx of DVT On chronic warfarin, reversed This admission INR 3.1->Kcentra ->1.2 Hx UGIB w/ polyp removal 08/2022 admission 07/2023 w/ supratherapeutic INR and anemia  Hypoxic Respiratory Failure Intubated 1/29 d/t neuro decline CCM on board On SBT Off precedex  Not candidate for extubation due to mental status Tmax 100.4->100.6->100.7->100.6 Leukocytosis WBC 11.1--9.2--9.3--8.9 Sputum culture MRSA Now on Rocephin ->Ancef ->zyvox  D/c seroquel  150mg  daily Pending trach today  Hypertensive Emergency Home meds:  clonidine  0.1 bid, diltiazem  cd 240, lasix  40 high on arrival  Cleviprex  gtt off On clonidine  0.3 tid and  cozaar  100 on amlodipine  10 and coreg  12.5->25 bid Add hydralazine  50 Q8h SBP Mysliwiec < 160  Hyperlipidemia Home meds:  crestor  10mg  LDL 79, Arif < 70 Resume home crestor  Continue statin at discharge  Diabetes type II controlled Home meds:  metformin , ozempic  HgbA1c 6.9, at [place] < 7.0 CBGs SSI Outpt PCP follow up for DM control  Dysphagia Secondary to stroke NPO Speech on board On TF @ 45 and IVF @ 50 S/p PEG 2/5  Other Stroke Risk Factors Advanced Age >/= 25  Remote hx cocaine use (quit 2003, reab 2005). UDS positive this admission for benzos and fentanyl  (given inpatient) Morbid Obesity, Body mass index is 44.59 kg/m. 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 This patient's care requires constant monitoring of vital signs, hemodynamics, respiratory and cardiac monitoring, review of multiple databases, neurological assessment, discussion with family, other specialists and medical decision making of high complexity. I spent 35 minutes of neurocritical care time in the care of this patient. Discussed with Dr. Theophilus CCM.

## 2024-06-05 ENCOUNTER — Ambulatory Visit: Admitting: Family Medicine

## 2024-08-26 ENCOUNTER — Inpatient Hospital Stay: Admitting: Oncology

## 2024-08-26 ENCOUNTER — Inpatient Hospital Stay

## 2025-03-16 ENCOUNTER — Ambulatory Visit
